# Patient Record
Sex: Female | Born: 1960
Health system: Southern US, Community
[De-identification: ages and names within clinical notes are randomized; demographics above are authoritative.]

## PROBLEM LIST (undated history)

## (undated) DIAGNOSIS — M1990 Unspecified osteoarthritis, unspecified site: Secondary | ICD-10-CM

## (undated) DIAGNOSIS — E785 Hyperlipidemia, unspecified: Secondary | ICD-10-CM

## (undated) DIAGNOSIS — F1920 Other psychoactive substance dependence, uncomplicated: Secondary | ICD-10-CM

## (undated) DIAGNOSIS — F1410 Cocaine abuse, uncomplicated: Secondary | ICD-10-CM

## (undated) DIAGNOSIS — Z5900 Homelessness unspecified: Secondary | ICD-10-CM

## (undated) DIAGNOSIS — F329 Major depressive disorder, single episode, unspecified: Secondary | ICD-10-CM

## (undated) DIAGNOSIS — K219 Gastro-esophageal reflux disease without esophagitis: Secondary | ICD-10-CM

## (undated) DIAGNOSIS — E1322 Other specified diabetes mellitus with diabetic chronic kidney disease: Secondary | ICD-10-CM

## (undated) DIAGNOSIS — J45909 Unspecified asthma, uncomplicated: Secondary | ICD-10-CM

## (undated) DIAGNOSIS — F1210 Cannabis abuse, uncomplicated: Secondary | ICD-10-CM

## (undated) DIAGNOSIS — C801 Malignant (primary) neoplasm, unspecified: Secondary | ICD-10-CM

## (undated) DIAGNOSIS — Q2112 Patent foramen ovale: Secondary | ICD-10-CM

## (undated) DIAGNOSIS — F32A Depression, unspecified: Secondary | ICD-10-CM

## (undated) DIAGNOSIS — T7840XA Allergy, unspecified, initial encounter: Secondary | ICD-10-CM

## (undated) DIAGNOSIS — I10 Essential (primary) hypertension: Secondary | ICD-10-CM

## (undated) DIAGNOSIS — J449 Chronic obstructive pulmonary disease, unspecified: Secondary | ICD-10-CM

## (undated) DIAGNOSIS — Q211 Atrial septal defect: Secondary | ICD-10-CM

## (undated) DIAGNOSIS — Z860101 Personal history of adenomatous and serrated colon polyps: Secondary | ICD-10-CM

## (undated) DIAGNOSIS — J302 Other seasonal allergic rhinitis: Secondary | ICD-10-CM

## (undated) DIAGNOSIS — F419 Anxiety disorder, unspecified: Secondary | ICD-10-CM

## (undated) DIAGNOSIS — F1010 Alcohol abuse, uncomplicated: Secondary | ICD-10-CM

## (undated) DIAGNOSIS — N183 Chronic kidney disease, stage 3 (moderate): Secondary | ICD-10-CM

## (undated) DIAGNOSIS — R011 Cardiac murmur, unspecified: Secondary | ICD-10-CM

## (undated) HISTORY — DX: Major depressive disorder, single episode, unspecified: F32.9

## (undated) HISTORY — DX: Patent foramen ovale: Q21.12

## (undated) HISTORY — DX: Chronic kidney disease, stage 3 (moderate): N18.3

## (undated) HISTORY — DX: Unspecified osteoarthritis, unspecified site: M19.90

## (undated) HISTORY — DX: Allergy, unspecified, initial encounter: T78.40XA

## (undated) HISTORY — DX: Atrial septal defect: Q21.1

## (undated) HISTORY — DX: Chronic obstructive pulmonary disease, unspecified: J44.9

## (undated) HISTORY — DX: Unspecified asthma, uncomplicated: J45.909

## (undated) HISTORY — DX: Hyperlipidemia, unspecified: E78.5

## (undated) HISTORY — DX: Depression, unspecified: F32.A

## (undated) HISTORY — DX: Anxiety disorder, unspecified: F41.9

## (undated) HISTORY — PX: UPPER GASTROINTESTINAL ENDOSCOPY: SHX188

## (undated) HISTORY — DX: Malignant (primary) neoplasm, unspecified: C80.1

## (undated) HISTORY — DX: Personal history of adenomatous and serrated colon polyps: Z86.0101

## (undated) HISTORY — DX: Gastro-esophageal reflux disease without esophagitis: K21.9

## (undated) HISTORY — DX: Cardiac murmur, unspecified: R01.1

## (undated) HISTORY — DX: Other specified diabetes mellitus with diabetic chronic kidney disease: E13.22

---

## 2000-12-25 ENCOUNTER — Emergency Department (HOSPITAL_COMMUNITY): Admission: EM | Admit: 2000-12-25 | Discharge: 2000-12-25 | Payer: Self-pay | Admitting: Emergency Medicine

## 2000-12-25 ENCOUNTER — Encounter: Payer: Self-pay | Admitting: Emergency Medicine

## 2000-12-27 ENCOUNTER — Emergency Department (HOSPITAL_COMMUNITY): Admission: EM | Admit: 2000-12-27 | Discharge: 2000-12-27 | Payer: Self-pay | Admitting: *Deleted

## 2003-11-18 ENCOUNTER — Emergency Department (HOSPITAL_COMMUNITY): Admission: EM | Admit: 2003-11-18 | Discharge: 2003-11-18 | Payer: Self-pay | Admitting: Emergency Medicine

## 2008-01-25 ENCOUNTER — Emergency Department (HOSPITAL_COMMUNITY): Admission: EM | Admit: 2008-01-25 | Discharge: 2008-01-25 | Payer: Self-pay | Admitting: Emergency Medicine

## 2008-09-25 ENCOUNTER — Emergency Department (HOSPITAL_COMMUNITY): Admission: EM | Admit: 2008-09-25 | Discharge: 2008-09-26 | Payer: Self-pay | Admitting: Emergency Medicine

## 2008-10-15 ENCOUNTER — Emergency Department (HOSPITAL_COMMUNITY): Admission: EM | Admit: 2008-10-15 | Discharge: 2008-10-15 | Payer: Self-pay | Admitting: Emergency Medicine

## 2008-11-04 ENCOUNTER — Emergency Department (HOSPITAL_COMMUNITY): Admission: EM | Admit: 2008-11-04 | Discharge: 2008-11-04 | Payer: Self-pay | Admitting: Family Medicine

## 2008-11-09 ENCOUNTER — Emergency Department (HOSPITAL_COMMUNITY): Admission: EM | Admit: 2008-11-09 | Discharge: 2008-11-09 | Payer: Self-pay | Admitting: Family Medicine

## 2008-12-09 ENCOUNTER — Emergency Department (HOSPITAL_COMMUNITY): Admission: EM | Admit: 2008-12-09 | Discharge: 2008-12-09 | Payer: Self-pay | Admitting: Family Medicine

## 2008-12-19 ENCOUNTER — Encounter (HOSPITAL_BASED_OUTPATIENT_CLINIC_OR_DEPARTMENT_OTHER): Admission: RE | Admit: 2008-12-19 | Discharge: 2009-03-19 | Payer: Self-pay | Admitting: General Surgery

## 2008-12-22 ENCOUNTER — Ambulatory Visit: Payer: Self-pay | Admitting: Surgery

## 2008-12-22 ENCOUNTER — Ambulatory Visit: Admission: RE | Admit: 2008-12-22 | Discharge: 2008-12-22 | Payer: Self-pay | Admitting: General Surgery

## 2008-12-22 ENCOUNTER — Encounter (HOSPITAL_BASED_OUTPATIENT_CLINIC_OR_DEPARTMENT_OTHER): Payer: Self-pay | Admitting: General Surgery

## 2009-03-18 HISTORY — PX: FRACTURE SURGERY: SHX138

## 2009-05-05 ENCOUNTER — Emergency Department (HOSPITAL_COMMUNITY): Admission: EM | Admit: 2009-05-05 | Discharge: 2009-05-06 | Payer: Self-pay | Admitting: Emergency Medicine

## 2010-01-27 ENCOUNTER — Emergency Department (HOSPITAL_COMMUNITY): Admission: EM | Admit: 2010-01-27 | Discharge: 2010-01-28 | Payer: Self-pay | Admitting: Emergency Medicine

## 2010-03-02 ENCOUNTER — Emergency Department (HOSPITAL_COMMUNITY)
Admission: EM | Admit: 2010-03-02 | Discharge: 2010-03-03 | Payer: Self-pay | Source: Home / Self Care | Admitting: Emergency Medicine

## 2010-05-28 LAB — CBC
HCT: 40.3 % (ref 36.0–46.0)
Platelets: 310 10*3/uL (ref 150–400)
RBC: 4.36 MIL/uL (ref 3.87–5.11)
RDW: 16.9 % — ABNORMAL HIGH (ref 11.5–15.5)
WBC: 7.4 10*3/uL (ref 4.0–10.5)

## 2010-05-28 LAB — COMPREHENSIVE METABOLIC PANEL
ALT: 35 U/L (ref 0–35)
Albumin: 3.9 g/dL (ref 3.5–5.2)
Alkaline Phosphatase: 136 U/L — ABNORMAL HIGH (ref 39–117)
Chloride: 103 mEq/L (ref 96–112)
Potassium: 4.7 mEq/L (ref 3.5–5.1)
Sodium: 138 mEq/L (ref 135–145)
Total Protein: 8.1 g/dL (ref 6.0–8.3)

## 2010-05-28 LAB — DIFFERENTIAL
Basophils Relative: 1 % (ref 0–1)
Eosinophils Absolute: 0.2 10*3/uL (ref 0.0–0.7)
Eosinophils Relative: 3 % (ref 0–5)
Monocytes Absolute: 0.6 10*3/uL (ref 0.1–1.0)
Monocytes Relative: 8 % (ref 3–12)

## 2010-05-28 LAB — RAPID URINE DRUG SCREEN, HOSP PERFORMED: Tetrahydrocannabinol: NOT DETECTED

## 2010-05-28 LAB — URINALYSIS, ROUTINE W REFLEX MICROSCOPIC
Hgb urine dipstick: NEGATIVE
Nitrite: NEGATIVE
Specific Gravity, Urine: 1.008 (ref 1.005–1.030)
Urobilinogen, UA: 0.2 mg/dL (ref 0.0–1.0)
pH: 5 (ref 5.0–8.0)

## 2010-05-28 LAB — ETHANOL: Alcohol, Ethyl (B): 340 mg/dL — ABNORMAL HIGH (ref 0–10)

## 2010-05-28 LAB — POCT PREGNANCY, URINE: Preg Test, Ur: NEGATIVE

## 2010-12-18 LAB — CBC
HCT: 40.2
MCV: 100.5 — ABNORMAL HIGH
Platelets: 104 — ABNORMAL LOW
WBC: 8.7

## 2010-12-18 LAB — URINALYSIS, ROUTINE W REFLEX MICROSCOPIC
Hgb urine dipstick: NEGATIVE
Protein, ur: NEGATIVE
Urobilinogen, UA: 0.2

## 2010-12-18 LAB — POCT PREGNANCY, URINE: Preg Test, Ur: NEGATIVE

## 2010-12-18 LAB — POCT I-STAT, CHEM 8
Creatinine, Ser: 1.2
Glucose, Bld: 108 — ABNORMAL HIGH
Hemoglobin: 14.3
Sodium: 132 — ABNORMAL LOW
TCO2: 21

## 2010-12-18 LAB — DIFFERENTIAL
Eosinophils Absolute: 0
Eosinophils Relative: 0
Lymphs Abs: 0.4 — ABNORMAL LOW
Monocytes Relative: 8

## 2010-12-18 LAB — URINE MICROSCOPIC-ADD ON

## 2011-06-05 ENCOUNTER — Encounter (HOSPITAL_COMMUNITY): Payer: Self-pay | Admitting: *Deleted

## 2011-06-05 ENCOUNTER — Emergency Department (HOSPITAL_COMMUNITY)
Admission: EM | Admit: 2011-06-05 | Discharge: 2011-06-05 | Disposition: A | Discharge status: Home / Self Care | Payer: No Typology Code available for payment source | Attending: Emergency Medicine | Admitting: Emergency Medicine

## 2011-06-05 DIAGNOSIS — IMO0002 Reserved for concepts with insufficient information to code with codable children: Secondary | ICD-10-CM | POA: Insufficient documentation

## 2011-06-05 DIAGNOSIS — S39012A Strain of muscle, fascia and tendon of lower back, initial encounter: Secondary | ICD-10-CM

## 2011-06-05 DIAGNOSIS — S8392XA Sprain of unspecified site of left knee, initial encounter: Secondary | ICD-10-CM

## 2011-06-05 DIAGNOSIS — S335XXA Sprain of ligaments of lumbar spine, initial encounter: Secondary | ICD-10-CM | POA: Insufficient documentation

## 2011-06-05 MED ORDER — IBUPROFEN 800 MG PO TABS: 400.0000 mg | ORAL_TABLET | Freq: Three times a day (TID) | ORAL | Status: AC

## 2011-06-05 MED ORDER — HYDROCODONE-ACETAMINOPHEN 5-325 MG PO TABS: 1.0000 | ORAL_TABLET | ORAL | Status: AC | PRN
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
## 2011-06-05 NOTE — Discharge Instructions (Signed)
Take vicodin as prescribed for severe pain.   Do not drive within four hours of taking this medication (may cause drowsiness or confusion).  Take ibuprofen w/ food twice a day as well.  Apply ice to painful areas 2-3 times a day for 15-20 minutes.  Avoid activities that aggravate pain.  Follow up with your doctor for blood pressure recheck.  Uncontrolled high blood pressure can lead to heart attack, stroke, blindness and kidney failure.  Call [facility] [phone] if you do not have a primary care doctor and would like assistance with finding one.   You may return to the ER if symptoms worsen or you have any other concerns.
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
## 2011-06-05 NOTE — ED Provider Notes (Signed)
Medical screening examination/treatment/procedure(s) were performed by non-physician practitioner and as supervising physician I was immediately available for consultation/collaboration.   Julianne Rice, MD 06/05/11 1425

## 2012-05-02 ENCOUNTER — Emergency Department (HOSPITAL_COMMUNITY)
Admission: EM | Admit: 2012-05-02 | Discharge: 2012-05-02 | Disposition: A | Discharge status: Home / Self Care | Payer: Self-pay | Attending: Emergency Medicine | Admitting: Emergency Medicine

## 2012-05-02 ENCOUNTER — Encounter (HOSPITAL_COMMUNITY): Payer: Self-pay | Admitting: *Deleted

## 2012-05-02 DIAGNOSIS — X789XXA Intentional self-harm by unspecified sharp object, initial encounter: Secondary | ICD-10-CM | POA: Insufficient documentation

## 2012-05-02 DIAGNOSIS — IMO0002 Reserved for concepts with insufficient information to code with codable children: Secondary | ICD-10-CM | POA: Insufficient documentation

## 2012-05-02 DIAGNOSIS — S61509A Unspecified open wound of unspecified wrist, initial encounter: Secondary | ICD-10-CM | POA: Insufficient documentation

## 2012-05-02 DIAGNOSIS — F1910 Other psychoactive substance abuse, uncomplicated: Secondary | ICD-10-CM | POA: Insufficient documentation

## 2012-05-02 DIAGNOSIS — T7491XA Unspecified adult maltreatment, confirmed, initial encounter: Secondary | ICD-10-CM | POA: Insufficient documentation

## 2012-05-02 DIAGNOSIS — F1010 Alcohol abuse, uncomplicated: Secondary | ICD-10-CM | POA: Insufficient documentation

## 2012-05-02 DIAGNOSIS — F10929 Alcohol use, unspecified with intoxication, unspecified: Secondary | ICD-10-CM

## 2012-05-02 DIAGNOSIS — Z7289 Other problems related to lifestyle: Secondary | ICD-10-CM

## 2012-05-02 DIAGNOSIS — T7411XA Adult physical abuse, confirmed, initial encounter: Secondary | ICD-10-CM | POA: Insufficient documentation

## 2012-05-02 DIAGNOSIS — T7492XA Unspecified child maltreatment, confirmed, initial encounter: Secondary | ICD-10-CM | POA: Insufficient documentation

## 2012-05-02 DIAGNOSIS — F17200 Nicotine dependence, unspecified, uncomplicated: Secondary | ICD-10-CM | POA: Insufficient documentation

## 2012-05-02 LAB — COMPREHENSIVE METABOLIC PANEL
ALT: 69 U/L — ABNORMAL HIGH (ref 0–35)
Albumin: 3.9 g/dL (ref 3.5–5.2)
Alkaline Phosphatase: 99 U/L (ref 39–117)
Potassium: 4.8 mEq/L (ref 3.5–5.1)
Sodium: 138 mEq/L (ref 135–145)
Total Protein: 8.1 g/dL (ref 6.0–8.3)

## 2012-05-02 LAB — CBC WITH DIFFERENTIAL/PLATELET
Basophils Relative: 1 % (ref 0–1)
Eosinophils Absolute: 0.2 10*3/uL (ref 0.0–0.7)
MCH: 35.2 pg — ABNORMAL HIGH (ref 26.0–34.0)
MCHC: 35.7 g/dL (ref 30.0–36.0)
Neutrophils Relative %: 57 % (ref 43–77)
Platelets: 268 10*3/uL (ref 150–400)
RDW: 12.4 % (ref 11.5–15.5)

## 2012-05-02 LAB — ACETAMINOPHEN LEVEL: Acetaminophen (Tylenol), Serum: <15 ug/mL (ref 10–30)

## 2012-05-02 LAB — ETHANOL: Alcohol, Ethyl (B): 266 mg/dL — ABNORMAL HIGH (ref 0–11)

## 2012-05-02 NOTE — ED Notes (Signed)
Pt placed in paper scrubs, wanded by security. 1 bag of pt belongings behind nurse's desk

## 2012-05-02 NOTE — ED Notes (Signed)
Pt took off dressing covering wrist lacerations, threw it on floor. Stated she did not want to wear it. Bleeding controlled. No sitters available at this time

## 2012-05-02 NOTE — ED Notes (Signed)
MD at bedside. 

## 2012-05-02 NOTE — ED Notes (Signed)
Pt tearful - reports taking "his" vicodin, and other recreational drugs/alcohol. reports last drink 2 hours ago. Out of prison 1 year ago, has been struggling with addiction since. Pt states she does not want or need detox. Reports cutting wrist to "releive pain"
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
## 2012-05-02 NOTE — ED Notes (Signed)
[id number] Expected date:05/02/12<BR> Expected time:12:32 PM<BR> Means of arrival:Ambulance<BR> Comments:<BR> ETOH, S/a cut wrist
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
## 2012-05-02 NOTE — ED Notes (Signed)
Pt got bag of belongings from behind nurse's desk, changed into clothing. Found pt in room getting dressed, stating daughter is going to pick her up. Security and Agricultural consultant notified

## 2012-08-19 ENCOUNTER — Encounter (HOSPITAL_COMMUNITY): Payer: Self-pay | Admitting: Emergency Medicine

## 2012-08-19 ENCOUNTER — Emergency Department (INDEPENDENT_AMBULATORY_CARE_PROVIDER_SITE_OTHER)
Admission: EM | Admit: 2012-08-19 | Discharge: 2012-08-19 | Disposition: A | Discharge status: Home / Self Care | Payer: Self-pay | Source: Home / Self Care | Attending: Emergency Medicine | Admitting: Emergency Medicine

## 2012-08-19 DIAGNOSIS — R490 Dysphonia: Secondary | ICD-10-CM

## 2012-08-19 DIAGNOSIS — I83813 Varicose veins of bilateral lower extremities with pain: Secondary | ICD-10-CM

## 2012-08-19 DIAGNOSIS — J029 Acute pharyngitis, unspecified: Secondary | ICD-10-CM

## 2012-08-19 DIAGNOSIS — I83893 Varicose veins of bilateral lower extremities with other complications: Secondary | ICD-10-CM

## 2012-08-19 LAB — POCT RAPID STREP A: Streptococcus, Group A Screen (Direct): NEGATIVE

## 2012-08-19 MED ORDER — PREDNISONE 20 MG PO TABS: 40.0000 mg | ORAL_TABLET | Freq: Every day | ORAL | Status: AC

## 2012-08-19 MED ORDER — ACETAMINOPHEN 500 MG PO TABS: 500.0000 mg | ORAL_TABLET | Freq: Four times a day (QID) | ORAL | Status: DC | PRN

## 2012-08-19 MED ORDER — CETIRIZINE HCL 10 MG PO CAPS: 1.0000 | ORAL_CAPSULE | Freq: Every day | ORAL | Status: DC

## 2012-08-19 NOTE — ED Provider Notes (Signed)
History     CSN: [id number]  Arrival date & time 08/19/12  1014   First MD Initiated Contact with Patient 08/19/12 1031      Chief Complaint  Patient presents with  . Sore Throat  . Leg Pain    (Consider location/radiation/quality/duration/timing/severity/associated sxs/prior treatment) HPI Comments: Patient presents urgent care is morning describing for about 2 months ago she's been having discomfort when she swallows and her voice has changed for almost 2 months as she is very hoarse. She's also describing cough and upper respiratory congestion, as if she is having allergies. Denies any fevers, and occasionally feels short of breath with wheezing. Patient denies any fevers or any shortness of breath at rest denies any chest pains. She does admit that she is a chronic smoker. Patient denies any constitutional symptoms such as fevers, bodyaches such as a progress, myalgias or unintentional weight loss.   Pace it also once to report that for several months at the end of the day her legs get swollen and she feels soreness most specifically in the anterior aspect of both Tibial regions. She has had varicose veins for a [place] time.  Patient is a 52 y.o. female presenting with pharyngitis.  Sore Throat This is a recurrent problem. The problem occurs constantly. The problem has not changed since onset.Pertinent negatives include no chest pain, no headaches and no shortness of breath. The symptoms are aggravated by swallowing. Nothing relieves the symptoms. The treatment provided no relief.    History reviewed. No pertinent past medical history.  Past Surgical History  Procedure Laterality Date  . Fracture surgery      L arm    No family history on file.  History  Substance Use Topics  . Smoking status: Current Every Day Smoker -- 1.00 packs/day    Types: Cigarettes  . Smokeless tobacco: Not on file  . Alcohol Use: Yes    OB History   Grav Para Term Preterm Abortions TAB SAB Ect  Mult Living                  Review of Systems  Constitutional: Negative for fever, chills and fatigue.  HENT: Positive for ear pain, congestion, sore throat, rhinorrhea, voice change and postnasal drip. Negative for hearing loss and sinus pressure.   Eyes: Negative for pain.  Respiratory: Negative for shortness of breath.   Cardiovascular: Negative for chest pain.  Musculoskeletal: Negative for joint swelling and arthralgias.  Skin: Negative for color change, rash and wound.  Neurological: Negative for weakness and headaches.    Allergies  Review of patient's allergies indicates no known allergies.  Home Medications   Current Outpatient Rx  Name  Route  Sig  Dispense  Refill  . acetaminophen (TYLENOL) 500 MG tablet   Oral   Take 1 tablet (500 mg total) by mouth every 6 (six) hours as needed for pain.   30 tablet   0   . Cetirizine HCl (ZYRTEC ALLERGY) 10 MG CAPS   Oral   Take 1 capsule (10 mg total) by mouth daily. X 2 weeks   30 capsule   1   . predniSONE (DELTASONE) 20 MG tablet   Oral   Take 2 tablets (40 mg total) by mouth daily.   15 tablet   0     BP 146/83  Pulse 86  Temp(Src) 97.8 F (36.6 C) (Oral)  Resp 16  SpO2 100%  Physical Exam  Nursing note and vitals reviewed. Constitutional: No distress.  HENT:  Head: Normocephalic.  Mouth/Throat: Uvula is midline and mucous membranes are normal. Posterior oropharyngeal erythema present. No oropharyngeal exudate, posterior oropharyngeal edema or tonsillar abscesses.  Eyes: Conjunctivae are normal. Right eye exhibits no discharge. Left eye exhibits no discharge. No scleral icterus.  Neck: Neck supple. No JVD present. No tracheal deviation present. No thyromegaly present.  Pulmonary/Chest: No respiratory distress. She has no wheezes. She has no rales. She exhibits no tenderness.  Lymphadenopathy:    She has no cervical adenopathy.  Neurological: She is alert.  Skin: No rash noted. No erythema. No pallor.     ED Course  Procedures (including critical care time)  Labs Reviewed  POCT RAPID STREP A ([place])   No results found.   1. Hoarseness of voice   2. Pharyngitis   3. 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 to have a direct laryngoscopy. Have explained to her that smokers have a higher incidence of laryngeal and pharyngeal carcinoma. Patient has been started on a structure regimen of Zyrtec along with a course of prednisone  Problem #2 lower extremity pains. Today's exam was not revealing of a localize lower extremity infection 2 soft tissue such as thrombophlebitis, lymphangitis or cellulitis. Patient has extensive varicose veins in telangiectasias we discussed strategies to minimize swelling and pain  Problem #3 chronic smoker and no continuity of care patient has been provided today with, information to establish with the newly opened [facility]. Have explained to patient that if cough persisted beyond 2 weeks to bring this to the attention of her new doctor at a clinic.       Rosana Hoes, MD 08/19/12 [phone]

## 2012-08-19 NOTE — ED Notes (Signed)
Pt c/o sore throat onset 2 months Sx include: loss of voice, bilateral ear pain, HA, odynophagia, dyspnea Hx of allergies... Sx get worse at night... Trying OTC allergy meds w/no relief  Also c/o bilateral leg pain due to varicose veins  She is alert and oriented w/no signs of acute distress.

## 2012-09-15 ENCOUNTER — Emergency Department (HOSPITAL_COMMUNITY)
Admission: EM | Admit: 2012-09-15 | Discharge: 2012-09-16 | Disposition: A | Discharge status: Home / Self Care | Payer: No Typology Code available for payment source | Attending: Emergency Medicine | Admitting: Emergency Medicine

## 2012-09-15 ENCOUNTER — Emergency Department (HOSPITAL_COMMUNITY): Payer: Self-pay

## 2012-09-15 DIAGNOSIS — S0990XA Unspecified injury of head, initial encounter: Secondary | ICD-10-CM | POA: Insufficient documentation

## 2012-09-15 DIAGNOSIS — S46909A Unspecified injury of unspecified muscle, fascia and tendon at shoulder and upper arm level, unspecified arm, initial encounter: Secondary | ICD-10-CM | POA: Insufficient documentation

## 2012-09-15 DIAGNOSIS — IMO0002 Reserved for concepts with insufficient information to code with codable children: Secondary | ICD-10-CM | POA: Insufficient documentation

## 2012-09-15 DIAGNOSIS — Z23 Encounter for immunization: Secondary | ICD-10-CM | POA: Insufficient documentation

## 2012-09-15 DIAGNOSIS — S81809A Unspecified open wound, unspecified lower leg, initial encounter: Secondary | ICD-10-CM | POA: Insufficient documentation

## 2012-09-15 DIAGNOSIS — F17200 Nicotine dependence, unspecified, uncomplicated: Secondary | ICD-10-CM | POA: Insufficient documentation

## 2012-09-15 DIAGNOSIS — S81009A Unspecified open wound, unspecified knee, initial encounter: Secondary | ICD-10-CM | POA: Insufficient documentation

## 2012-09-15 DIAGNOSIS — S4980XA Other specified injuries of shoulder and upper arm, unspecified arm, initial encounter: Secondary | ICD-10-CM | POA: Insufficient documentation

## 2012-09-15 DIAGNOSIS — Y9301 Activity, walking, marching and hiking: Secondary | ICD-10-CM | POA: Insufficient documentation

## 2012-09-15 DIAGNOSIS — Y92410 Unspecified street and highway as the place of occurrence of the external cause: Secondary | ICD-10-CM | POA: Insufficient documentation

## 2012-09-15 MED ORDER — FENTANYL CITRATE 0.05 MG/ML IJ SOLN
INTRAMUSCULAR | Status: AC
  Administered 2012-09-16: 50 ug via INTRAVENOUS
  Filled 2012-09-15: qty 2

## 2012-09-15 MED ORDER — TETANUS-DIPHTH-ACELL PERTUSSIS 5-2.5-18.5 LF-MCG/0.5 IM SUSP
0.5000 mL | Freq: Once | INTRAMUSCULAR | Status: AC
  Administered 2012-09-16: 0.5 mL via INTRAMUSCULAR

## 2012-09-15 MED ORDER — FENTANYL CITRATE 0.05 MG/ML IJ SOLN
50.0000 ug | INTRAMUSCULAR | Status: DC | PRN
  Administered 2012-09-15 – 2012-09-16 (×4): 50 ug via INTRAVENOUS
  Filled 2012-09-15: qty 2

## 2012-09-15 MED ORDER — ONDANSETRON HCL 4 MG/2ML IJ SOLN
4.0000 mg | Freq: Once | INTRAMUSCULAR | Status: AC
  Administered 2012-09-16: 4 mg via INTRAVENOUS
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
## 2012-09-16 ENCOUNTER — Encounter (HOSPITAL_COMMUNITY): Payer: Self-pay | Admitting: Radiology

## 2012-09-16 ENCOUNTER — Emergency Department (HOSPITAL_COMMUNITY): Payer: No Typology Code available for payment source

## 2012-09-16 ENCOUNTER — Emergency Department (HOSPITAL_COMMUNITY): Payer: Self-pay

## 2012-09-16 LAB — BASIC METABOLIC PANEL
Calcium: 9 mg/dL (ref 8.4–10.5)
GFR calc non Af Amer: >90 mL/min (ref >90)
Potassium: 3.5 mEq/L (ref 3.5–5.1)
Sodium: 139 mEq/L (ref 135–145)

## 2012-09-16 LAB — CBC
Hemoglobin: 13.8 g/dL (ref 12.0–15.0)
MCH: 36.4 pg — ABNORMAL HIGH (ref 26.0–34.0)
MCHC: 36.3 g/dL — ABNORMAL HIGH (ref 30.0–36.0)
Platelets: 161 10*3/uL (ref 150–400)
RBC: 3.79 MIL/uL — ABNORMAL LOW (ref 3.87–5.11)

## 2012-09-16 MED ORDER — CEFAZOLIN SODIUM 1-5 GM-% IV SOLN
1.0000 g | Freq: Once | INTRAVENOUS | Status: AC
  Administered 2012-09-16: 1 g via INTRAVENOUS
  Filled 2012-09-16: qty 50

## 2012-09-16 MED ORDER — HYDROCODONE-ACETAMINOPHEN 5-325 MG PO TABS: 2.0000 | ORAL_TABLET | ORAL | Status: DC | PRN

## 2012-09-16 MED ORDER — IOHEXOL 300 MG/ML  SOLN
100.0000 mL | Freq: Once | INTRAMUSCULAR | Status: AC | PRN
  Administered 2012-09-16: 100 mL via INTRAVENOUS

## 2012-09-16 MED ORDER — ONDANSETRON HCL 4 MG/2ML IJ SOLN
INTRAMUSCULAR | Status: AC
  Filled 2012-09-16: qty 2

## 2012-09-16 MED ORDER — HYDROMORPHONE HCL PF 1 MG/ML IJ SOLN
1.0000 mg | Freq: Once | INTRAMUSCULAR | Status: AC
  Administered 2012-09-16: 1 mg via INTRAVENOUS
  Filled 2012-09-16: qty 1

## 2012-09-16 MED ORDER — IBUPROFEN 600 MG PO TABS: 600.0000 mg | ORAL_TABLET | Freq: Four times a day (QID) | ORAL | Status: DC | PRN

## 2012-09-16 MED ORDER — CEPHALEXIN 500 MG PO CAPS: 500.0000 mg | ORAL_CAPSULE | Freq: Four times a day (QID) | ORAL | Status: DC

## 2012-09-16 MED ORDER — TETANUS-DIPHTH-ACELL PERTUSSIS 5-2.5-18.5 LF-MCG/0.5 IM SUSP
INTRAMUSCULAR | Status: AC
  Filled 2012-09-16: qty 0.5
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
## 2012-09-26 ENCOUNTER — Emergency Department (HOSPITAL_COMMUNITY)
Admission: EM | Admit: 2012-09-26 | Discharge: 2012-09-26 | Disposition: A | Discharge status: Home / Self Care | Payer: No Typology Code available for payment source | Attending: Emergency Medicine | Admitting: Emergency Medicine

## 2012-09-26 ENCOUNTER — Encounter (HOSPITAL_COMMUNITY): Payer: Self-pay | Admitting: Emergency Medicine

## 2012-09-26 DIAGNOSIS — J309 Allergic rhinitis, unspecified: Secondary | ICD-10-CM | POA: Insufficient documentation

## 2012-09-26 DIAGNOSIS — Z79899 Other long term (current) drug therapy: Secondary | ICD-10-CM | POA: Insufficient documentation

## 2012-09-26 DIAGNOSIS — I10 Essential (primary) hypertension: Secondary | ICD-10-CM | POA: Insufficient documentation

## 2012-09-26 DIAGNOSIS — IMO0001 Reserved for inherently not codable concepts without codable children: Secondary | ICD-10-CM | POA: Insufficient documentation

## 2012-09-26 DIAGNOSIS — F17200 Nicotine dependence, unspecified, uncomplicated: Secondary | ICD-10-CM | POA: Insufficient documentation

## 2012-09-26 DIAGNOSIS — Z4802 Encounter for removal of sutures: Secondary | ICD-10-CM

## 2012-09-26 HISTORY — DX: Essential (primary) hypertension: I10

## 2012-09-26 HISTORY — DX: Other seasonal allergic rhinitis: J30.2

## 2012-09-26 MED ORDER — TRAMADOL HCL 50 MG PO TABS
50.0000 mg | ORAL_TABLET | Freq: Once | ORAL | Status: AC
  Administered 2012-09-26: 50 mg via ORAL
  Filled 2012-09-26: qty 1

## 2012-09-26 MED ORDER — IBUPROFEN 600 MG PO TABS: 600.0000 mg | ORAL_TABLET | Freq: Three times a day (TID) | ORAL | Status: DC | PRN

## 2012-09-26 NOTE — ED Provider Notes (Signed)
History  This chart was scribed for non-physician practitioner Baron Sane, PA-C, working with Wandra Arthurs, MD, by Neta Ehlers, ED Scribe. This patient was seen in room TR10C/TR10C and the patient's care was started at 5:16 PM.   CSN: [id number] Arrival date & time 09/26/12  1640  First MD Initiated Contact with Patient 09/26/12 1658     Chief Complaint  Patient presents with  . Suture / Staple Removal    The history is provided by the patient. No language interpreter was used.   HPI Comments: [name] is a 52 y.o. female PMHx significant for HTN who presents to the Emergency Department for the purpose of staple removal from a wound to the shin of her left leg which occurred eleven days ago. The pt states that the wound has been healing well, but she reports that the wound has been draining a clear yellowish fluid, but denies any purulent drainage. She denies any increased redness to the wound or surrounding skin. She denies any fever. She reports that the wound is still painful, and she describes the pain as an 8/10.  Patient states she has been taking all medications as prescribed to her, including the Keflex, at initial visit. She denies any numbness or tingling in the extremity.   Past Medical History  Diagnosis Date  . Hypertension     pt stated "every once in a while BP will be high but has not been prescribed medication for HTN.   . Seasonal allergies    Past Surgical History  Procedure Laterality Date  . Fracture surgery      L arm  . Cesarean section     No family history on file. History  Substance Use Topics  . Smoking status: Current Every Day Smoker -- 1.00 packs/day    Types: Cigarettes  . Smokeless tobacco: Not on file  . Alcohol Use: Yes   No OB history provided.  Review of Systems  Constitutional: Negative for fever.  Musculoskeletal: Positive for myalgias.  Skin: Positive for wound. Negative for color change and pallor.  Neurological:  Negative for weakness and numbness.    Allergies  Review of patient's allergies indicates no known allergies.  Home Medications   Current Outpatient Rx  Name  Route  Sig  Dispense  Refill  . albuterol (PROVENTIL HFA;VENTOLIN HFA) 108 (90 BASE) MCG/ACT inhaler   Inhalation   Inhale 2 puffs into the lungs every 6 (six) hours as needed for wheezing.         . cephALEXin (KEFLEX) 500 MG capsule   Oral   Take 500 mg by mouth 4 (four) times daily.         Marland Kitchen HYDROcodone-acetaminophen (NORCO/VICODIN) 5-325 MG per tablet   Oral   Take 1 tablet by mouth every 6 (six) hours as needed for pain.         Marland Kitchen ibuprofen (ADVIL,MOTRIN) 200 MG tablet   Oral   Take 400 mg by mouth daily as needed for pain.         Marland Kitchen ibuprofen (ADVIL,MOTRIN) 600 MG tablet   Oral   Take 600 mg by mouth every 6 (six) hours as needed for pain.          Triage Vitals: BP 149/96  Pulse 102  Temp(Src) 98.1 F (36.7 C) (Oral)  Resp 18  SpO2 100%  Physical Exam  Nursing note and vitals reviewed. Constitutional: She is oriented to person, place, and time. She appears well-developed and  well-nourished. No distress.  HENT:  Head: Normocephalic and atraumatic.  Eyes: Conjunctivae are normal.  Neck: Neck supple.  Cardiovascular: Intact distal pulses.   Musculoskeletal: She exhibits no edema.  Neurological: She is alert and oriented to person, place, and time.  Skin: Skin is warm and dry. She is not diaphoretic. No pallor.  Appropriately healing wound on left lower extremity. No edema, warmth, purulent drainage at site.   Psychiatric: She has a normal mood and affect.    ED Course  Procedures (including critical care time)  DIAGNOSTIC STUDIES: Oxygen Saturation is 100% on room air, normal  by my interpretation.    COORDINATION OF CARE:  5:19 PM- Discussed the treatment plan with the pt which includes the removal of the staples and wound care, and the pt agreed.   5:26 PM SUTURE  REMOVAL Performed by: Baron Sane, PA-C Authorized by: Wandra Arthurs, MD Consent: Verbal consent obtained. Consent given by: patient Required items: required blood products, implants, devices, and special equipment available  Time out: Immediately prior to procedure a "time out" was called to verify the correct patient, procedure, equipment, support staff and site/side marked as required. Location: Medial aspect of shin of left leg.  Wound Appearance: clean  Staples Removed: 14 Post-removal: sterile dressing applied Patient tolerance: Patient tolerated the procedure well with no immediate complications.   Labs Reviewed - No data to display No results found. 1. Encounter for staple removal     MDM  Pt to ER for staple/suture removal and wound check as above. Procedure tolerated well. Vitals normal, no signs of infection. Pt is currently on Keflex as prescribed at initial visit. Scar minimization & return precautions given at dc. Patient is agreeable to plan. Patient is stable at time of discharge    I personally performed the services described in this documentation, which was scribed in my presence. The recorded information has been reviewed and is accurate.     Harlow Mares, PA-C 09/26/12 1919

## 2012-09-26 NOTE — ED Notes (Signed)
Pt walked outside

## 2012-09-26 NOTE — ED Notes (Signed)
Pt here for staple removal from left leg.

## 2012-09-27 NOTE — ED Provider Notes (Signed)
Medical screening examination/treatment/procedure(s) were performed by non-physician practitioner and as supervising physician I was immediately available for consultation/collaboration.   Wandra Arthurs, MD 09/27/12 [phone]

## 2012-10-28 ENCOUNTER — Encounter (HOSPITAL_COMMUNITY): Payer: Self-pay | Admitting: Emergency Medicine

## 2012-10-28 ENCOUNTER — Emergency Department (HOSPITAL_COMMUNITY): Payer: No Typology Code available for payment source

## 2012-10-28 ENCOUNTER — Inpatient Hospital Stay (HOSPITAL_COMMUNITY)
Admission: EM | Admit: 2012-10-28 | Discharge: 2012-10-30 | DRG: 863 | Disposition: A | Discharge status: Home / Self Care | Payer: No Typology Code available for payment source | Attending: Family Medicine | Admitting: Family Medicine

## 2012-10-28 DIAGNOSIS — IMO0002 Reserved for concepts with insufficient information to code with codable children: Secondary | ICD-10-CM

## 2012-10-28 DIAGNOSIS — T798XXA Other early complications of trauma, initial encounter: Secondary | ICD-10-CM

## 2012-10-28 DIAGNOSIS — L03116 Cellulitis of left lower limb: Secondary | ICD-10-CM

## 2012-10-28 DIAGNOSIS — Z5900 Homelessness unspecified: Secondary | ICD-10-CM

## 2012-10-28 DIAGNOSIS — Y92410 Unspecified street and highway as the place of occurrence of the external cause: Secondary | ICD-10-CM

## 2012-10-28 DIAGNOSIS — D7589 Other specified diseases of blood and blood-forming organs: Secondary | ICD-10-CM | POA: Diagnosis present

## 2012-10-28 DIAGNOSIS — F1010 Alcohol abuse, uncomplicated: Secondary | ICD-10-CM | POA: Diagnosis present

## 2012-10-28 DIAGNOSIS — L02419 Cutaneous abscess of limb, unspecified: Secondary | ICD-10-CM

## 2012-10-28 DIAGNOSIS — T8133XS Disruption of traumatic injury wound repair, sequela: Secondary | ICD-10-CM

## 2012-10-28 DIAGNOSIS — F17200 Nicotine dependence, unspecified, uncomplicated: Secondary | ICD-10-CM | POA: Diagnosis present

## 2012-10-28 DIAGNOSIS — K7689 Other specified diseases of liver: Secondary | ICD-10-CM | POA: Diagnosis present

## 2012-10-28 DIAGNOSIS — L089 Local infection of the skin and subcutaneous tissue, unspecified: Principal | ICD-10-CM | POA: Diagnosis present

## 2012-10-28 DIAGNOSIS — I10 Essential (primary) hypertension: Secondary | ICD-10-CM | POA: Diagnosis present

## 2012-10-28 DIAGNOSIS — L03119 Cellulitis of unspecified part of limb: Secondary | ICD-10-CM

## 2012-10-28 DIAGNOSIS — B1920 Unspecified viral hepatitis C without hepatic coma: Secondary | ICD-10-CM | POA: Diagnosis present

## 2012-10-28 DIAGNOSIS — T148XXA Other injury of unspecified body region, initial encounter: Principal | ICD-10-CM

## 2012-10-28 DIAGNOSIS — B182 Chronic viral hepatitis C: Secondary | ICD-10-CM | POA: Diagnosis present

## 2012-10-28 DIAGNOSIS — R7989 Other specified abnormal findings of blood chemistry: Secondary | ICD-10-CM | POA: Diagnosis present

## 2012-10-28 LAB — COMPREHENSIVE METABOLIC PANEL
Alkaline Phosphatase: 181 U/L — ABNORMAL HIGH (ref 39–117)
BUN: 6 mg/dL (ref 6–23)
CO2: 22 mEq/L (ref 19–32)
Chloride: 101 mEq/L (ref 96–112)
Creatinine, Ser: 0.7 mg/dL (ref 0.50–1.10)
GFR calc non Af Amer: >90 mL/min (ref >90)
Total Bilirubin: 0.5 mg/dL (ref 0.3–1.2)

## 2012-10-28 LAB — CBC WITH DIFFERENTIAL/PLATELET
HCT: 39.9 % (ref 36.0–46.0)
Hemoglobin: 14.4 g/dL (ref 12.0–15.0)
Lymphocytes Relative: 16 % (ref 12–46)
Lymphs Abs: 1.1 10*3/uL (ref 0.7–4.0)
Monocytes Absolute: 1 10*3/uL (ref 0.1–1.0)
Monocytes Relative: 14 % — ABNORMAL HIGH (ref 3–12)
Neutro Abs: 4.8 10*3/uL (ref 1.7–7.7)
RBC: 3.91 MIL/uL (ref 3.87–5.11)
WBC: 6.9 10*3/uL (ref 4.0–10.5)

## 2012-10-28 MED ORDER — HYDROMORPHONE HCL PF 1 MG/ML IJ SOLN
1.0000 mg | INTRAMUSCULAR | Status: DC | PRN
  Administered 2012-10-28 – 2012-10-29 (×2): 1 mg via INTRAVENOUS
  Filled 2012-10-28 (×2): qty 1

## 2012-10-28 MED ORDER — ONDANSETRON HCL 4 MG PO TABS
4.0000 mg | ORAL_TABLET | Freq: Four times a day (QID) | ORAL | Status: DC | PRN
  Administered 2012-10-29: 4 mg via ORAL
  Filled 2012-10-28: qty 1

## 2012-10-28 MED ORDER — ALBUTEROL SULFATE (5 MG/ML) 0.5% IN NEBU: 2.5000 mg | INHALATION_SOLUTION | RESPIRATORY_TRACT | Status: DC | PRN

## 2012-10-28 MED ORDER — SODIUM CHLORIDE 0.9 % IJ SOLN: 3.0000 mL | INTRAMUSCULAR | Status: DC | PRN

## 2012-10-28 MED ORDER — HYDROCODONE-ACETAMINOPHEN 5-325 MG PO TABS
1.0000 | ORAL_TABLET | ORAL | Status: DC | PRN
  Administered 2012-10-29 – 2012-10-30 (×7): 2 via ORAL
  Filled 2012-10-28 (×7): qty 2

## 2012-10-28 MED ORDER — ONDANSETRON HCL 4 MG/2ML IJ SOLN
4.0000 mg | Freq: Four times a day (QID) | INTRAMUSCULAR | Status: DC | PRN
  Administered 2012-10-29: 4 mg via INTRAVENOUS
  Filled 2012-10-28: qty 2

## 2012-10-28 MED ORDER — SODIUM CHLORIDE 0.9 % IV BOLUS (SEPSIS)
1000.0000 mL | Freq: Once | INTRAVENOUS | Status: AC
  Administered 2012-10-28: 1000 mL via INTRAVENOUS

## 2012-10-28 MED ORDER — HYDROMORPHONE HCL PF 1 MG/ML IJ SOLN
1.0000 mg | Freq: Once | INTRAMUSCULAR | Status: AC
  Administered 2012-10-28: 1 mg via INTRAVENOUS
  Filled 2012-10-28: qty 1

## 2012-10-28 MED ORDER — ENOXAPARIN SODIUM 40 MG/0.4ML ~~LOC~~ SOLN
40.0000 mg | Freq: Every day | SUBCUTANEOUS | Status: DC
  Administered 2012-10-28 – 2012-10-29 (×2): 40 mg via SUBCUTANEOUS
  Filled 2012-10-28 (×3): qty 0.4

## 2012-10-28 MED ORDER — SODIUM CHLORIDE 0.9 % IV SOLN: 250.0000 mL | INTRAVENOUS | Status: DC | PRN

## 2012-10-28 MED ORDER — CLINDAMYCIN PHOSPHATE 600 MG/50ML IV SOLN
600.0000 mg | Freq: Once | INTRAVENOUS | Status: AC
  Administered 2012-10-28: 600 mg via INTRAVENOUS
  Filled 2012-10-28: qty 50

## 2012-10-28 MED ORDER — SODIUM CHLORIDE 0.9 % IJ SOLN
3.0000 mL | Freq: Two times a day (BID) | INTRAMUSCULAR | Status: DC
  Administered 2012-10-29: 3 mL via INTRAVENOUS

## 2012-10-28 MED ORDER — CLINDAMYCIN PHOSPHATE 600 MG/50ML IV SOLN
600.0000 mg | Freq: Three times a day (TID) | INTRAVENOUS | Status: DC
  Administered 2012-10-29 – 2012-10-30 (×4): 600 mg via INTRAVENOUS
  Filled 2012-10-28 (×6): qty 50

## 2012-10-28 MED ORDER — SODIUM CHLORIDE 0.9 % IV SOLN: INTRAVENOUS | Status: DC

## 2012-10-28 NOTE — ED Notes (Signed)
Wound to left medial shin. Area is warm, erythemous and tender to touch.
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
## 2012-10-28 NOTE — ED Notes (Signed)
Social work at bedside.  
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
## 2012-10-29 ENCOUNTER — Inpatient Hospital Stay (HOSPITAL_COMMUNITY): Payer: MEDICAID

## 2012-10-29 LAB — CBC
HCT: 40 % (ref 36.0–46.0)
MCHC: 36.3 g/dL — ABNORMAL HIGH (ref 30.0–36.0)
MCV: 101.8 fL — ABNORMAL HIGH (ref 78.0–100.0)
Platelets: 121 10*3/uL — ABNORMAL LOW (ref 150–400)
RDW: 13.5 % (ref 11.5–15.5)

## 2012-10-29 LAB — CK: Total CK: 84 U/L (ref 7–177)

## 2012-10-29 LAB — BASIC METABOLIC PANEL
BUN: 5 mg/dL — ABNORMAL LOW (ref 6–23)
Calcium: 8.8 mg/dL (ref 8.4–10.5)
Creatinine, Ser: 0.52 mg/dL (ref 0.50–1.10)
GFR calc Af Amer: >90 mL/min (ref >90)
GFR calc non Af Amer: >90 mL/min (ref >90)
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
## 2012-10-29 NOTE — Progress Notes (Signed)
   CARE MANAGEMENT NOTE 10/29/2012  Patient:  [name], [name]   Account Number:  [id number]  Date Initiated:  10/29/2012  Documentation initiated by:  Olga Coaster  Subjective/Objective Assessment:   ADMITTED WITH WOUND INFECTION     Action/Plan:   PATIENT STAYS WITH BOYFRIEND?; SOC WORKER REFERRAL PLACED - ETOH/ ? HOMELESSNESS   Anticipated DC Date:  11/05/2012   Anticipated DC Plan:  HOME/SELF CARE  In-house referral  Clinical Social Research scientist (physical sciences)      DC Planning Services  CM consult          Status of service:  In process, will continue to follow Medicare Important Message given?  NA - LOS <3 / Initial given by admissions (If response is "NO", the following Medicare IM given date fields will be blank) Per UR Regulation:  Reviewed for med. necessity/level of care/duration of stay  Comments:  10/29/2012- B Kristinia Leavy RN,BSN,MHA
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
## 2012-10-30 MED ORDER — POTASSIUM CHLORIDE CRYS ER 20 MEQ PO TBCR
40.0000 meq | EXTENDED_RELEASE_TABLET | Freq: Once | ORAL | Status: AC
  Administered 2012-10-30: 40 meq via ORAL
  Filled 2012-10-30: qty 2

## 2012-10-30 MED ORDER — CLINDAMYCIN HCL 300 MG PO CAPS: 300.0000 mg | ORAL_CAPSULE | Freq: Four times a day (QID) | ORAL | Status: DC

## 2012-10-30 MED ORDER — HYDROCODONE-ACETAMINOPHEN 5-325 MG PO TABS: 1.0000 | ORAL_TABLET | Freq: Four times a day (QID) | ORAL | Status: DC | PRN

## 2012-10-30 NOTE — Care Management Note (Signed)
    Page 1 of 1   10/30/2012     4:40:07 PM   CARE MANAGEMENT NOTE 10/30/2012  Patient:  [name], [name]   Account Number:  [id number]  Date Initiated:  10/29/2012  Documentation initiated by:  Olga Coaster  Subjective/Objective Assessment:   ADMITTED WITH WOUND INFECTION     Action/Plan:   PATIENT STAYS WITH BOYFRIEND?; SOC WORKER REFERRAL PLACED - ETOH/ ? HOMELESSNESS   Anticipated DC Date:  11/05/2012   Anticipated DC Plan:  HOME/SELF CARE  In-house referral  Clinical Social Worker  Development worker, community      DC Planning Services  CM consult      Choice offered to / List presented to:             Status of service:  In process, will continue to follow Medicare Important Message given?  NA - LOS <3 / Initial given by admissions (If response is "NO", the following Medicare IM given date fields will be blank) Date Medicare IM given:   Date Additional Medicare IM given:    Discharge Disposition:    Per UR Regulation:  Reviewed for med. necessity/level of care/duration of stay  If discussed at [place] of Stay Meetings, dates discussed:    Comments:  10/30/12 [place], MSN, CM-  Spoke with patient on the phone after discharge to provide information for the [facility] to establish PCP.  Address and phone number provided.   10/29/2012- B CHANDLER RN,BSN,MHA

## 2012-10-30 NOTE — Plan of Care (Signed)
D/c instructions given to pt. V/u on how to care for incision to left lower leg. Med script given to pt. V/u. Pt is stable to be d/c. Iv d/c.
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
## 2012-11-04 LAB — CULTURE, BLOOD (ROUTINE X 2): Culture: NO GROWTH

## 2012-11-11 ENCOUNTER — Emergency Department (HOSPITAL_COMMUNITY)
Admission: EM | Admit: 2012-11-11 | Discharge: 2012-11-11 | Disposition: A | Discharge status: Home / Self Care | Payer: MEDICAID | Attending: Emergency Medicine | Admitting: Emergency Medicine

## 2012-11-11 ENCOUNTER — Encounter (HOSPITAL_COMMUNITY): Payer: Self-pay | Admitting: *Deleted

## 2012-11-11 DIAGNOSIS — Z760 Encounter for issue of repeat prescription: Secondary | ICD-10-CM | POA: Insufficient documentation

## 2012-11-11 DIAGNOSIS — Z79899 Other long term (current) drug therapy: Secondary | ICD-10-CM | POA: Insufficient documentation

## 2012-11-11 DIAGNOSIS — F17200 Nicotine dependence, unspecified, uncomplicated: Secondary | ICD-10-CM | POA: Insufficient documentation

## 2012-11-11 DIAGNOSIS — Z5189 Encounter for other specified aftercare: Secondary | ICD-10-CM

## 2012-11-11 DIAGNOSIS — I10 Essential (primary) hypertension: Secondary | ICD-10-CM | POA: Insufficient documentation

## 2012-11-11 DIAGNOSIS — Z4800 Encounter for change or removal of nonsurgical wound dressing: Secondary | ICD-10-CM | POA: Insufficient documentation

## 2012-11-11 MED ORDER — HYDROCODONE-ACETAMINOPHEN 5-325 MG PO TABS: 1.0000 | ORAL_TABLET | Freq: Four times a day (QID) | ORAL | Status: DC | PRN
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
## 2012-11-11 NOTE — ED Provider Notes (Signed)
Medical screening examination/treatment/procedure(s) were conducted as a shared visit with non-physician practitioner(s) and myself.  I personally evaluated the patient during the encounter  I examined and interviewed the pt. Lungs CTAB. Cardiac exam wnl. Abdomen soft. LLE wound appears to be healing well. Still surrounded by mild erythema, but pt notes it is much improved. I do not think a 2nd round of abx is needed at this time. Will have pt return for any worsening pain, redness, swelling, or purulent drainage. She understands.   Blanchard Kelch, MD 11/11/12 1949

## 2012-11-25 ENCOUNTER — Ambulatory Visit: Payer: Self-pay | Attending: Internal Medicine | Admitting: Internal Medicine

## 2012-11-25 VITALS — BP 132/86 | HR 76 | Temp 98.5°F | Resp 17 | Wt 141.8 lb

## 2012-11-25 DIAGNOSIS — X58XXXA Exposure to other specified factors, initial encounter: Secondary | ICD-10-CM | POA: Insufficient documentation

## 2012-11-25 DIAGNOSIS — S81002A Unspecified open wound, left knee, initial encounter: Secondary | ICD-10-CM

## 2012-11-25 DIAGNOSIS — S81009A Unspecified open wound, unspecified knee, initial encounter: Secondary | ICD-10-CM

## 2012-11-25 DIAGNOSIS — S8990XA Unspecified injury of unspecified lower leg, initial encounter: Secondary | ICD-10-CM | POA: Insufficient documentation

## 2012-11-25 MED ORDER — OXYCODONE-ACETAMINOPHEN 5-325 MG PO TABS: 1.0000 | ORAL_TABLET | Freq: Three times a day (TID) | ORAL | Status: DC | PRN

## 2012-11-25 NOTE — Patient Instructions (Signed)
Dressing Change °A dressing is a material placed over wounds. It keeps the wound clean, dry, and protected from further injury. This provides an environment that favors wound healing.  °BEFORE YOU BEGIN °· Get your supplies together. Things you may need include: °· Saline solution. °· Flexible gauze dressing. °· Medicated cream. °· Tape. °· Gloves. °· Abdominal dressing pads. °· Gauze squares. °· Plastic bags. °· Take pain medicine 30 minutes before the dressing change if you need it. °· Take a shower before you do the first dressing change of the day. Use plastic wrap or a plastic bag to prevent the dressing from getting wet. °REMOVING YOUR OLD DRESSING  °· Wash your hands with soap and water. Dry your hands with a clean towel. °· Put on your gloves. °· Remove any tape. °· Carefully remove the old dressing. If the dressing sticks, you may dampen it with warm water to loosen it, or follow your caregiver's specific directions. °· Remove any gauze or packing tape that is in your wound. °· Take off your gloves. °· Put the gloves, tape, gauze, or any packing tape into a plastic bag. °CHANGING YOUR DRESSING °· Open the supplies. °· Take the cap off the saline solution. °· Open the gauze package so that the gauze remains on the inside of the package. °· Put on your gloves. °· Clean your wound as told by your caregiver. °· If you have been told to keep your wound dry, follow those instructions. °· Your caregiver may tell you to do one or more of the following: °· Pick up the gauze. Pour the saline solution over the gauze. Squeeze out the extra saline solution. °· Put medicated cream or other medicine on your wound if you have been told to do so. °· Put the solution soaked gauze only in your wound, not on the skin around it. °· Pack your wound loosely or as told by your caregiver. °· Put dry gauze on your wound. °· Put abdominal dressing pads over the dry gauze if your wet gauze soaks through. °· Tape the abdominal dressing  pads in place so they will not fall off. Do not wrap the tape completely around the affected part (arm, leg, abdomen). °· Wrap the dressing pads with a flexible gauze dressing to secure it in place. °· Take off your gloves. Put them in the plastic bag with the old dressing. Tie the bag shut and throw it away. °· Keep the dressing clean and dry until your next dressing change. °· Wash your hands. °SEEK MEDICAL CARE IF: °· Your skin around the wound looks red. °· Your wound feels more tender or sore. °· You see pus in the wound. °· Your wound smells bad. °· You have a fever. °· Your skin around the wound has a rash that itches and burns. °· You see black or yellow skin in your wound that was not there before. °· You feel nauseous, throw up, and feel very tired. °Document Released: 04/11/2004 Document Revised: 05/27/2011 Document Reviewed: 01/14/2011 °ExitCare® Patient Information ©2014 [employer]. ° °
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
## 2012-12-02 ENCOUNTER — Ambulatory Visit: Payer: Self-pay

## 2012-12-18 ENCOUNTER — Encounter: Payer: Self-pay | Admitting: Family Medicine

## 2012-12-18 ENCOUNTER — Encounter (HOSPITAL_COMMUNITY): Payer: Self-pay | Admitting: Physical Medicine and Rehabilitation

## 2012-12-18 ENCOUNTER — Ambulatory Visit: Payer: No Typology Code available for payment source | Attending: Family Medicine | Admitting: Family Medicine

## 2012-12-18 ENCOUNTER — Emergency Department (HOSPITAL_COMMUNITY)
Admission: EM | Admit: 2012-12-18 | Discharge: 2012-12-18 | Disposition: A | Discharge status: Home / Self Care | Payer: No Typology Code available for payment source | Attending: Emergency Medicine | Admitting: Emergency Medicine

## 2012-12-18 DIAGNOSIS — M79605 Pain in left leg: Secondary | ICD-10-CM

## 2012-12-18 DIAGNOSIS — N611 Abscess of the breast and nipple: Secondary | ICD-10-CM

## 2012-12-18 DIAGNOSIS — Z79899 Other long term (current) drug therapy: Secondary | ICD-10-CM | POA: Insufficient documentation

## 2012-12-18 DIAGNOSIS — I10 Essential (primary) hypertension: Secondary | ICD-10-CM

## 2012-12-18 DIAGNOSIS — F17200 Nicotine dependence, unspecified, uncomplicated: Secondary | ICD-10-CM | POA: Insufficient documentation

## 2012-12-18 DIAGNOSIS — N61 Mastitis without abscess: Secondary | ICD-10-CM | POA: Insufficient documentation

## 2012-12-18 DIAGNOSIS — M79606 Pain in leg, unspecified: Secondary | ICD-10-CM | POA: Insufficient documentation

## 2012-12-18 DIAGNOSIS — M79609 Pain in unspecified limb: Secondary | ICD-10-CM

## 2012-12-18 MED ORDER — OXYCODONE-ACETAMINOPHEN 5-325 MG PO TABS
2.0000 | ORAL_TABLET | Freq: Once | ORAL | Status: AC
  Administered 2012-12-18: 2 via ORAL
  Filled 2012-12-18: qty 2

## 2012-12-18 MED ORDER — CLINDAMYCIN HCL 150 MG PO CAPS: 150.0000 mg | ORAL_CAPSULE | Freq: Four times a day (QID) | ORAL | Status: DC

## 2012-12-18 MED ORDER — OXYCODONE-ACETAMINOPHEN 5-325 MG PO TABS: 1.0000 | ORAL_TABLET | ORAL | Status: DC | PRN

## 2012-12-18 MED ORDER — LIDOCAINE HCL (PF) 2 % IJ SOLN
5.0000 mL | Freq: Once | INTRAMUSCULAR | Status: AC
  Administered 2012-12-18: 5 mL via INTRADERMAL

## 2012-12-18 MED ORDER — ALBUTEROL SULFATE HFA 108 (90 BASE) MCG/ACT IN AERS: 2.0000 | INHALATION_SPRAY | Freq: Four times a day (QID) | RESPIRATORY_TRACT | Status: DC | PRN

## 2012-12-18 MED ORDER — LISINOPRIL-HYDROCHLOROTHIAZIDE 10-12.5 MG PO TABS: 1.0000 | ORAL_TABLET | Freq: Every day | ORAL | Status: DC

## 2012-12-18 NOTE — Progress Notes (Signed)
PT BEING TRANSFERRED TO [facility]. REPORT GIVEN TO NURSE SERISA

## 2012-12-18 NOTE — ED Provider Notes (Signed)
CSN: [id number]     Arrival date & time 12/18/12  1223 History   First MD Initiated Contact with Patient 12/18/12 1252     Chief Complaint  Patient presents with  . Abscess   (Consider location/radiation/quality/duration/timing/severity/associated sxs/prior Treatment) HPI Comments: Jaylynne Lamond Poudrier is a 52 y.o. Female who states that she has had a swelling area of her left breast for several days. She saw her PCP, today, for evaluation of a left lower leg wound. The leg wound was revaluated, following prior presentation for same, and she was referred to a surgeon at [facility], to be evaluated for definitive surgical procedure, to treat a chronic wound. She has had recurrent left breast abscesses. The last one was 4 years ago. She denies recent fever, chills, nausea, vomiting, weakness, or dizziness. She's not tried anything for the breast problem. She has no other complaints. There are no other known modifying factors.  Patient is a 52 y.o. female presenting with abscess. The history is provided by the patient.  Abscess   Past Medical History  Diagnosis Date  . Hypertension     pt stated "every once in a while BP will be high but has not been prescribed medication for HTN.   . Seasonal allergies    Past Surgical History  Procedure Laterality Date  . Fracture surgery      L arm  . Cesarean section     No family history on file. History  Substance Use Topics  . Smoking status: Current Every Day Smoker -- 1.00 packs/day    Types: Cigarettes  . Smokeless tobacco: Not on file  . Alcohol Use: Yes     Comment: social   OB History   Grav Para Term Preterm Abortions TAB SAB Ect Mult Living                 Review of Systems  All other systems reviewed and are negative.    Allergies  Review of patient's allergies indicates no known allergies.  Home Medications   Current Outpatient Rx  Name  Route  Sig  Dispense  Refill  . albuterol (PROVENTIL HFA;VENTOLIN HFA)  108 (90 BASE) MCG/ACT inhaler   Inhalation   Inhale 2 puffs into the lungs every 6 (six) hours as needed for wheezing or shortness of breath.   1 Inhaler   4   . oxyCODONE-acetaminophen (ROXICET) 5-325 MG per tablet   Oral   Take 1 tablet by mouth every 8 (eight) hours as needed for pain.   90 tablet   0   . clindamycin (CLEOCIN) 150 MG capsule   Oral   Take 1 capsule (150 mg total) by mouth every 6 (six) hours.   28 capsule   0   . lisinopril-hydrochlorothiazide (PRINZIDE,ZESTORETIC) 10-12.5 MG per tablet   Oral   Take 1 tablet by mouth daily.   30 tablet   2   . oxyCODONE-acetaminophen (PERCOCET/ROXICET) 5-325 MG per tablet   Oral   Take 1 tablet by mouth every 4 (four) hours as needed for pain.   15 tablet   0    BP 165/88  Pulse 62  Temp(Src) 97.2 F (36.2 C) (Oral)  Resp 20  SpO2 99% Physical Exam  Nursing note and vitals reviewed. Constitutional: She is oriented to person, place, and time. She appears well-developed and well-nourished.  HENT:  Head: Normocephalic and atraumatic.  Right Ear: External ear normal.  Left Ear: External ear normal.  Eyes: Conjunctivae and  EOM are normal. Pupils are equal, round, and reactive to light.  Neck: Normal range of motion and phonation normal. Neck supple.  Cardiovascular: Normal rate.   Hypertensive  Pulmonary/Chest: Effort normal. She exhibits no bony tenderness.  Left breast with redness, induration, and fluctuance just medial to the nipple. Area of involvement is 3 x 3 cm. There is no drainage. There is no associated lymphadenopathy. There is a mild deformity overlying the area of induration, consistent with previous surgical intervention.  Abdominal: Normal appearance.  Musculoskeletal: Normal range of motion.  Neurological: She is alert and oriented to person, place, and time. No cranial nerve deficit or sensory deficit. She exhibits normal muscle tone. Coordination normal.  Skin: Skin is warm, dry and intact.   Psychiatric: She has a normal mood and affect. Her behavior is normal. Judgment and thought content normal.    ED Course  Procedures (including critical care time)  Medications  oxyCODONE-acetaminophen (PERCOCET/ROXICET) 5-325 MG per tablet 2 tablet (2 tablets Oral Given 12/18/12 1314)  lidocaine (XYLOCAINE) 2 % injection 5 mL (5 mLs Intradermal Given by Other 12/18/12 1450)     INCISION AND DRAINAGE Performed by: Richarda Blade Consent: Verbal consent obtained. Risks and benefits: risks, benefits and alternatives were discussed Type: abscess  Body area: left breast  Anesthesia: local infiltration  Incision was made with a scalpel.  Local anesthetic: lidocaine 2% without epinephrine  Anesthetic total: 2 ml  Complexity: complex Blunt dissection to break up loculations  Drainage: purulent  Drainage amount: moderate  Packing is not indicated  Patient tolerance: Patient tolerated the procedure well with no immediate complications.         MDM   1. Abscess of breast    Breast abscess, without cellulitis.  RecurrentTreated in ED. Stable for d/c.  Nursing Notes Reviewed/ Care Coordinated Applicable Imaging Reviewed Interpretation of Laboratory Data incorporated into ED treatment  Plan: Home Medications- Clindamycin, Percocet; Home Treatments- Heat TID; return here if the recommended treatment, does not improve the symptoms; Recommended follow up- PCP prn    Richarda Blade, MD 12/18/12 1559

## 2012-12-18 NOTE — Patient Instructions (Signed)
Go to the emergency room now for further evaluation and management of the breast abscess. Call the surgery office at [facility] and reschedule your appointment.
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
## 2012-12-18 NOTE — ED Notes (Signed)
Pt presents to department for evaluation of L breast abscess. Ongoing for several days. States history of same. 9/10 pain at the time. States redness and warmth to L breast. No drainage noted. Pt is alert and oriented x4.

## 2012-12-18 NOTE — ED Notes (Signed)
Pt comfortable with d/c and f/u instructions.

## 2012-12-18 NOTE — ED Notes (Signed)
Dr. Eulis Foster at bedside with this RN for examination. Pt tolerated well

## 2012-12-29 DIAGNOSIS — S81802A Unspecified open wound, left lower leg, initial encounter: Secondary | ICD-10-CM | POA: Insufficient documentation

## 2013-02-07 ENCOUNTER — Emergency Department (HOSPITAL_COMMUNITY)
Admission: EM | Admit: 2013-02-07 | Discharge: 2013-02-07 | Disposition: A | Discharge status: Home / Self Care | Payer: No Typology Code available for payment source | Attending: Emergency Medicine | Admitting: Emergency Medicine

## 2013-02-07 ENCOUNTER — Encounter (HOSPITAL_COMMUNITY): Payer: Self-pay | Admitting: Emergency Medicine

## 2013-02-07 DIAGNOSIS — Z5189 Encounter for other specified aftercare: Secondary | ICD-10-CM

## 2013-02-07 DIAGNOSIS — R110 Nausea: Secondary | ICD-10-CM | POA: Insufficient documentation

## 2013-02-07 DIAGNOSIS — G8911 Acute pain due to trauma: Secondary | ICD-10-CM | POA: Insufficient documentation

## 2013-02-07 DIAGNOSIS — Z79899 Other long term (current) drug therapy: Secondary | ICD-10-CM | POA: Insufficient documentation

## 2013-02-07 DIAGNOSIS — M79605 Pain in left leg: Secondary | ICD-10-CM

## 2013-02-07 DIAGNOSIS — Z4801 Encounter for change or removal of surgical wound dressing: Secondary | ICD-10-CM | POA: Insufficient documentation

## 2013-02-07 DIAGNOSIS — F17200 Nicotine dependence, unspecified, uncomplicated: Secondary | ICD-10-CM | POA: Insufficient documentation

## 2013-02-07 DIAGNOSIS — I10 Essential (primary) hypertension: Secondary | ICD-10-CM | POA: Insufficient documentation

## 2013-02-07 DIAGNOSIS — M79609 Pain in unspecified limb: Secondary | ICD-10-CM | POA: Insufficient documentation

## 2013-02-07 DIAGNOSIS — Z792 Long term (current) use of antibiotics: Secondary | ICD-10-CM | POA: Insufficient documentation

## 2013-02-07 MED ORDER — OXYCODONE-ACETAMINOPHEN 5-325 MG PO TABS: 1.0000 | ORAL_TABLET | Freq: Four times a day (QID) | ORAL | Status: DC | PRN

## 2013-02-07 MED ORDER — OXYCODONE-ACETAMINOPHEN 5-325 MG PO TABS
1.0000 | ORAL_TABLET | Freq: Once | ORAL | Status: AC
  Administered 2013-02-07: 1 via ORAL
  Filled 2013-02-07: qty 1
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
## 2013-02-10 NOTE — ED Provider Notes (Signed)
Medical screening examination/treatment/procedure(s) were performed by non-physician practitioner and as supervising physician I was immediately available for consultation/collaboration.  EKG Interpretation   None         Lebron Quam, MD 02/10/13 [id number]

## 2013-02-23 ENCOUNTER — Ambulatory Visit: Payer: Self-pay | Admitting: Internal Medicine

## 2013-05-27 ENCOUNTER — Ambulatory Visit: Payer: Self-pay | Admitting: Internal Medicine

## 2013-06-08 ENCOUNTER — Encounter: Payer: Self-pay | Admitting: Internal Medicine

## 2013-06-08 ENCOUNTER — Ambulatory Visit: Payer: Self-pay | Attending: Internal Medicine | Admitting: Internal Medicine

## 2013-06-08 VITALS — BP 150/80 | HR 85 | Temp 98.5°F | Resp 17 | Wt 139.6 lb

## 2013-06-08 DIAGNOSIS — F17200 Nicotine dependence, unspecified, uncomplicated: Secondary | ICD-10-CM

## 2013-06-08 DIAGNOSIS — G8929 Other chronic pain: Secondary | ICD-10-CM

## 2013-06-08 DIAGNOSIS — I10 Essential (primary) hypertension: Secondary | ICD-10-CM

## 2013-06-08 DIAGNOSIS — M2550 Pain in unspecified joint: Secondary | ICD-10-CM

## 2013-06-08 DIAGNOSIS — M79606 Pain in leg, unspecified: Secondary | ICD-10-CM

## 2013-06-08 DIAGNOSIS — M7918 Myalgia, other site: Secondary | ICD-10-CM

## 2013-06-08 DIAGNOSIS — Z720 Tobacco use: Secondary | ICD-10-CM | POA: Insufficient documentation

## 2013-06-08 DIAGNOSIS — R030 Elevated blood-pressure reading, without diagnosis of hypertension: Secondary | ICD-10-CM

## 2013-06-08 DIAGNOSIS — IMO0001 Reserved for inherently not codable concepts without codable children: Secondary | ICD-10-CM

## 2013-06-08 DIAGNOSIS — M79609 Pain in unspecified limb: Secondary | ICD-10-CM

## 2013-06-08 LAB — COMPLETE METABOLIC PANEL WITH GFR
ALT: 52 U/L — ABNORMAL HIGH (ref 0–35)
AST: 53 U/L — ABNORMAL HIGH (ref 0–37)
Albumin: 4 g/dL (ref 3.5–5.2)
Alkaline Phosphatase: 87 U/L (ref 39–117)
BUN: 15 mg/dL (ref 6–23)
CO2: 27 meq/L (ref 19–32)
Calcium: 10 mg/dL (ref 8.4–10.5)
Chloride: 100 mEq/L (ref 96–112)
Creat: 0.76 mg/dL (ref 0.50–1.10)
GFR, Est Non African American: >89 mL/min
GLUCOSE: 89 mg/dL (ref 70–99)
Potassium: 4 mEq/L (ref 3.5–5.3)
SODIUM: 139 meq/L (ref 135–145)
TOTAL PROTEIN: 7.5 g/dL (ref 6.0–8.3)
Total Bilirubin: 0.4 mg/dL (ref 0.2–1.2)

## 2013-06-08 MED ORDER — CLONIDINE HCL 0.1 MG PO TABS
0.1000 mg | ORAL_TABLET | Freq: Once | ORAL | Status: AC
  Administered 2013-06-08: 0.1 mg via ORAL

## 2013-06-08 MED ORDER — NICOTINE 21 MG/24HR TD PT24: 21.0000 mg | MEDICATED_PATCH | Freq: Every day | TRANSDERMAL | Status: DC

## 2013-06-08 MED ORDER — LISINOPRIL-HYDROCHLOROTHIAZIDE 10-12.5 MG PO TABS: 1.0000 | ORAL_TABLET | Freq: Every day | ORAL | Status: DC

## 2013-06-08 MED ORDER — ALBUTEROL SULFATE HFA 108 (90 BASE) MCG/ACT IN AERS: 2.0000 | INHALATION_SPRAY | Freq: Four times a day (QID) | RESPIRATORY_TRACT | Status: DC | PRN

## 2013-06-08 MED ORDER — GABAPENTIN 300 MG PO CAPS: 300.0000 mg | ORAL_CAPSULE | Freq: Three times a day (TID) | ORAL | Status: DC

## 2013-06-08 MED ORDER — IBUPROFEN 800 MG PO TABS: 800.0000 mg | ORAL_TABLET | Freq: Three times a day (TID) | ORAL | Status: DC | PRN

## 2013-06-08 NOTE — Progress Notes (Signed)
Patient her for follow up Complains of shoulder pain Back pain  Pain in arms Some numbness in her fingers
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
## 2013-07-27 ENCOUNTER — Emergency Department (HOSPITAL_COMMUNITY)
Admission: EM | Admit: 2013-07-27 | Discharge: 2013-07-27 | Disposition: A | Discharge status: Home / Self Care | Payer: No Typology Code available for payment source | Attending: Emergency Medicine | Admitting: Emergency Medicine

## 2013-07-27 ENCOUNTER — Encounter (HOSPITAL_COMMUNITY): Payer: Self-pay | Admitting: Emergency Medicine

## 2013-07-27 DIAGNOSIS — Z79899 Other long term (current) drug therapy: Secondary | ICD-10-CM | POA: Insufficient documentation

## 2013-07-27 DIAGNOSIS — F17200 Nicotine dependence, unspecified, uncomplicated: Secondary | ICD-10-CM | POA: Insufficient documentation

## 2013-07-27 DIAGNOSIS — Z792 Long term (current) use of antibiotics: Secondary | ICD-10-CM | POA: Insufficient documentation

## 2013-07-27 DIAGNOSIS — F10929 Alcohol use, unspecified with intoxication, unspecified: Secondary | ICD-10-CM

## 2013-07-27 DIAGNOSIS — I10 Essential (primary) hypertension: Secondary | ICD-10-CM | POA: Insufficient documentation

## 2013-07-27 DIAGNOSIS — F919 Conduct disorder, unspecified: Secondary | ICD-10-CM | POA: Insufficient documentation

## 2013-07-27 DIAGNOSIS — F911 Conduct disorder, childhood-onset type: Secondary | ICD-10-CM | POA: Insufficient documentation

## 2013-07-27 DIAGNOSIS — S51809A Unspecified open wound of unspecified forearm, initial encounter: Secondary | ICD-10-CM | POA: Insufficient documentation

## 2013-07-27 DIAGNOSIS — Z7289 Other problems related to lifestyle: Secondary | ICD-10-CM

## 2013-07-27 DIAGNOSIS — Z8709 Personal history of other diseases of the respiratory system: Secondary | ICD-10-CM | POA: Insufficient documentation

## 2013-07-27 DIAGNOSIS — X789XXA Intentional self-harm by unspecified sharp object, initial encounter: Secondary | ICD-10-CM | POA: Insufficient documentation

## 2013-07-27 DIAGNOSIS — F1010 Alcohol abuse, uncomplicated: Secondary | ICD-10-CM | POA: Insufficient documentation

## 2013-07-27 HISTORY — DX: Alcohol abuse, uncomplicated: F10.10
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
## 2013-07-27 NOTE — Discharge Instructions (Signed)
Alcohol Intoxication Alcohol intoxication occurs when you drink enough alcohol that it affects your ability to function. It can be mild or very severe. Drinking a lot of alcohol in a short time is called binge drinking. This can be very harmful. Drinking alcohol can also be more dangerous if you are taking medicines or other drugs. Some of the effects caused by alcohol may include:  Loss of coordination.  Changes in mood and behavior.  Unclear thinking.  Trouble talking (slurred speech).  Throwing up (vomiting).  Confusion.  Slowed breathing.  Twitching and shaking (seizures).  Loss of consciousness. HOME CARE  Do not drive after drinking alcohol.  Drink enough water and fluids to keep your pee (urine) clear or pale yellow. Avoid caffeine.  Only take medicine as told by your doctor. GET HELP IF:  You throw up (vomit) many times.  You do not feel better after a few days.  You frequently have alcohol intoxication. Your doctor can help decide if you should see a substance use treatment counselor. GET HELP RIGHT AWAY IF:  You become shaky when you stop drinking.  You have twitching and shaking.  You throw up blood. It may look bright red or like coffee grounds.  You notice blood in your poop (bowel movements).  You become lightheaded or pass out (faint). MAKE SURE YOU:   Understand these instructions.  Will watch your condition.  Will get help right away if you are not doing well or get worse. Document Released: 08/21/2007 Document Revised: 11/04/2012 Document Reviewed: 08/07/2012 [facility] Patient Information 2014 [place]. Wound Care Wound care helps prevent pain and infection.  You may need a tetanus shot if:  You cannot remember when you had your last tetanus shot.  You have never had a tetanus shot.  The injury broke your skin. If you need a tetanus shot and you choose not to have one, you may get tetanus. Sickness from tetanus can be  serious. HOME CARE   Only take medicine as told by your doctor.  Clean the wound daily with mild soap and water.  Change any bandages (dressings) as told by your doctor.  Put medicated cream and a bandage on the wound as told by your doctor.  Change the bandage if it gets wet, dirty, or starts to smell.  Take showers. Do not take baths, swim, or do anything that puts your wound under water.  Rest and raise (elevate) the wound until the pain and puffiness (swelling) are better.  Keep all doctor visits as told. GET HELP RIGHT AWAY IF:   Yellowish-white fluid (pus) comes from the wound.  Medicine does not lessen your pain.  There is a red streak going away from the wound.  You have a fever. MAKE SURE YOU:   Understand these instructions.  Will watch your condition.  Will get help right away if you are not doing well or get worse. Document Released: 12/12/2007 Document Revised: 05/27/2011 Document Reviewed: 07/08/2010 [facility] Patient Information 2014 [place], [place].
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
## 2013-08-02 NOTE — ED Provider Notes (Signed)
Medical screening examination/treatment/procedure(s) were performed by non-physician practitioner and as supervising physician I was immediately available for consultation/collaboration.  Leota Jacobsen, MD 08/02/13 [phone]

## 2013-11-10 ENCOUNTER — Emergency Department (HOSPITAL_COMMUNITY): Payer: Self-pay

## 2013-11-10 ENCOUNTER — Encounter (HOSPITAL_COMMUNITY): Payer: Self-pay | Admitting: Emergency Medicine

## 2013-11-10 ENCOUNTER — Emergency Department (HOSPITAL_COMMUNITY)
Admission: EM | Admit: 2013-11-10 | Discharge: 2013-11-10 | Disposition: A | Discharge status: Home / Self Care | Payer: Self-pay | Attending: Family Medicine | Admitting: Family Medicine

## 2013-11-10 DIAGNOSIS — I8390 Asymptomatic varicose veins of unspecified lower extremity: Secondary | ICD-10-CM | POA: Insufficient documentation

## 2013-11-10 DIAGNOSIS — S92351A Displaced fracture of fifth metatarsal bone, right foot, initial encounter for closed fracture: Secondary | ICD-10-CM

## 2013-11-10 DIAGNOSIS — S99929A Unspecified injury of unspecified foot, initial encounter: Secondary | ICD-10-CM

## 2013-11-10 DIAGNOSIS — Y929 Unspecified place or not applicable: Secondary | ICD-10-CM | POA: Insufficient documentation

## 2013-11-10 DIAGNOSIS — S8990XA Unspecified injury of unspecified lower leg, initial encounter: Secondary | ICD-10-CM | POA: Insufficient documentation

## 2013-11-10 DIAGNOSIS — Z79899 Other long term (current) drug therapy: Secondary | ICD-10-CM | POA: Insufficient documentation

## 2013-11-10 DIAGNOSIS — X500XXA Overexertion from strenuous movement or load, initial encounter: Secondary | ICD-10-CM | POA: Insufficient documentation

## 2013-11-10 DIAGNOSIS — Z8709 Personal history of other diseases of the respiratory system: Secondary | ICD-10-CM | POA: Insufficient documentation

## 2013-11-10 DIAGNOSIS — Y9301 Activity, walking, marching and hiking: Secondary | ICD-10-CM | POA: Insufficient documentation

## 2013-11-10 DIAGNOSIS — I10 Essential (primary) hypertension: Secondary | ICD-10-CM | POA: Insufficient documentation

## 2013-11-10 DIAGNOSIS — S99919A Unspecified injury of unspecified ankle, initial encounter: Secondary | ICD-10-CM

## 2013-11-10 DIAGNOSIS — S92309A Fracture of unspecified metatarsal bone(s), unspecified foot, initial encounter for closed fracture: Secondary | ICD-10-CM | POA: Insufficient documentation

## 2013-11-10 DIAGNOSIS — F17200 Nicotine dependence, unspecified, uncomplicated: Secondary | ICD-10-CM | POA: Insufficient documentation

## 2013-11-10 MED ORDER — OXYCODONE-ACETAMINOPHEN 5-325 MG PO TABS: 1.0000 | ORAL_TABLET | ORAL | Status: DC | PRN

## 2013-11-10 NOTE — ED Notes (Signed)
Pt shown how to apply cam walker and use crutches. Pt verbalized understanding.

## 2013-11-10 NOTE — Discharge Instructions (Signed)
Metatarsal Fracture, Undisplaced  A metatarsal fracture is a break in the bone(s) of the foot. These are the bones of the foot that connect your toes to the bones of the ankle.  DIAGNOSIS   The diagnoses of these fractures are usually made with X-rays. If there are problems in the forefoot and x-rays are normal a later bone scan will usually make the diagnosis.   TREATMENT AND HOME CARE INSTRUCTIONS  · Treatment may or may not include a cast or walking shoe. When casts are needed the use is usually for short periods of time so as not to slow down healing with muscle wasting (atrophy).  · Activities should be stopped until further advised by your caregiver.  · Wear shoes with adequate shock absorbing capabilities and stiff soles.  · Alternative exercise may be undertaken while waiting for healing. These may include bicycling and swimming, or as your caregiver suggests.  · It is important to keep all follow-up visits or specialty referrals. The failure to keep these appointments could result in improper bone healing and chronic pain or disability.  · Warning: Do not drive a car or operate a motor vehicle until your caregiver specifically tells you it is safe to do so.  IF YOU DO NOT HAVE A CAST OR SPLINT:  · You may walk on your injured foot as tolerated or advised.  · Do not put any weight on your injured foot for as [place] as directed by your caregiver. Slowly increase the amount of time you walk on the foot as the pain allows or as advised.  · Use crutches until you can bear weight without pain. A gradual increase in weight bearing may help.  · Apply ice to the injury for 15-20 minutes each hour while awake for the first 2 days. Put the ice in a plastic bag and place a towel between the bag of ice and your skin.  · Only take over-the-counter or prescription medicines for pain, discomfort, or fever as directed by your caregiver.  SEEK IMMEDIATE MEDICAL CARE IF:   · Your cast gets damaged or breaks.  · You have  continued severe pain or more swelling than you did before the cast was put on, or the pain is not controlled with medications.  · Your skin or nails below the injury turn blue or grey, or feel cold or numb.  · There is a bad smell, or new stains or pus-like (purulent) drainage coming from the cast.  MAKE SURE YOU:   · Understand these instructions.  · Will watch your condition.  · Will get help right away if you are not doing well or get worse.  Document Released: 11/24/2001 Document Revised: 05/27/2011 Document Reviewed: 10/16/2007  [employer]® Patient Information ©2015 [employer]. This information is not intended to replace advice given to you by your health care provider. Make sure you discuss any questions you have with your health care provider.

## 2013-11-10 NOTE — ED Notes (Signed)
Right foot pain, twisted foot on the curbing.  Reports this ankle was twisted several months ago.  Too painful to move toes too much.  Pedal pulses 2 plus.  Does move toes, but they cause pain in right foot

## 2013-11-10 NOTE — ED Provider Notes (Signed)
CSN: [id number]     Arrival date & time 11/10/13  1901 History   First MD Initiated Contact with Patient 11/10/13 1933     Chief Complaint  Patient presents with  . Foot Pain     (Consider location/radiation/quality/duration/timing/severity/associated sxs/prior Treatment) HPI Comments: 53 year old female presents complaining of right foot pain and swelling. She was walking yesterday when she fell off a curb and twisted her ankle. She now has pain and swelling in the lateral portion of the right foot. The pain is increased with any ambulation and she cannot move her toes. She is also having subjective numbness in the foot. She believes she has fractured her foot. She is similar injury about 2-3 months ago, she says this injury is much more severe than her previous injury was.  denies any other injury  Patient is a 53 y.o. female presenting with lower extremity pain.  Foot Pain Associated symptoms include arthralgias, joint swelling and myalgias.    Past Medical History  Diagnosis Date  . Hypertension     pt stated "every once in a while BP will be high but has not been prescribed medication for HTN.   . Seasonal allergies   . Alcohol abuse    Past Surgical History  Procedure Laterality Date  . Fracture surgery      L arm  . Cesarean section     No family history on file. History  Substance Use Topics  . Smoking status: Current Every Day Smoker -- 1.00 packs/day    Types: Cigarettes  . Smokeless tobacco: Not on file  . Alcohol Use: 3.6 oz/week    6 Cans of beer per week   OB History   Grav Para Term Preterm Abortions TAB SAB Ect Mult Living                 Review of Systems  Musculoskeletal: Positive for arthralgias, joint swelling and myalgias.       See HPI  All other systems reviewed and are negative.     Allergies  Review of patient's allergies indicates no known allergies.  Home Medications   Prior to Admission medications   Medication Sig Start Date End  Date Taking? Authorizing Provider  albuterol (PROVENTIL HFA;VENTOLIN HFA) 108 (90 BASE) MCG/ACT inhaler Inhale 2 puffs into the lungs every 6 (six) hours as needed for wheezing or shortness of breath. 06/08/13  Yes Deepak Advani, MD  gabapentin (NEURONTIN) 300 MG capsule Take 300 mg by mouth 3 (three) times daily. 06/08/13  Yes Lorayne Marek, MD  ibuprofen (ADVIL,MOTRIN) 800 MG tablet Take 800 mg by mouth every 8 (eight) hours as needed for moderate pain. 06/08/13  Yes Lorayne Marek, MD  oxyCODONE-acetaminophen (PERCOCET/ROXICET) 5-325 MG per tablet Take 1 tablet by mouth every 4 (four) hours as needed for moderate pain or severe pain. 11/10/13   Freeman Caldron Kenard Morawski, PA-C   BP 167/85  Pulse 91  Temp(Src) 98.6 F (37 C) (Oral)  Resp 18  SpO2 100% Physical Exam  Nursing note and vitals reviewed. Constitutional: She is oriented to person, place, and time. Vital signs are normal. She appears well-developed and well-nourished. No distress.  HENT:  Head: Normocephalic and atraumatic.  Cardiovascular:  Pulses:      Dorsalis pedis pulses are 2+ on the right side.  Bilateral lower extremity varicosities  Pulmonary/Chest: Effort normal. No respiratory distress.  Musculoskeletal:       Right ankle: She exhibits decreased range of motion and swelling (lateral ). She exhibits  no deformity and normal pulse. Tenderness. Lateral malleolus and AITFL tenderness found. No medial malleolus tenderness found. Achilles tendon normal.       Right foot: She exhibits decreased range of motion, tenderness (diffuse, worse superior-lateral ), bony tenderness and swelling. She exhibits normal capillary refill and no deformity.  Neurological: She is alert and oriented to person, place, and time. She has normal strength. No cranial nerve deficit or sensory deficit. Gait (unable to bear weight on right foot ) abnormal. Coordination normal.  patient witnessed walking, but with a limp  Skin: Skin is warm and dry. No rash noted. She  is not diaphoretic.  Psychiatric: She has a normal mood and affect. Judgment normal.    ED Course  Procedures (including critical care time) Labs Review Labs Reviewed - No data to display  Imaging Review Dg Ankle Complete Right  11/10/2013   CLINICAL DATA:  Twisted right ankle and foot lock for.  EXAM: RIGHT ANKLE - COMPLETE 3+ VIEW  COMPARISON:  09/25/2008.  FINDINGS: Three view exam of the right ankle shows a transverse fracture through the base of the fifth metatarsal. Distal tibia and distal fibula are intact. Ankle mortise is preserved.  IMPRESSION: Transverse fracture involving the base of the fifth metatarsal.   Electronically Signed   By: Misty Stanley M.D.   On: 11/10/2013 20:50   Dg Foot Complete Right  11/10/2013   CLINICAL DATA:  Twisting injury right foot.  Pain.  EXAM: RIGHT FOOT COMPLETE - 3+ VIEW  COMPARISON:  None.  FINDINGS: The patient has a minimally distracted fracture through the base of the fifth metatarsal. No other acute bony or joint abnormality is identified. Soft tissue swelling is seen.  IMPRESSION: Acute, mildly distracted fracture base of fifth metatarsal.   Electronically Signed   By: Inge Rise M.D.   On: 11/10/2013 20:47     EKG Interpretation None      MDM   Final diagnoses:  Fracture of fifth metatarsal bone of right foot, closed, initial encounter    XR reveals proximal 5th metatarsal fracture.  Will treat with cam walker boot and crutches, minimal weight bearing, follow up with ortho.     Meds ordered this encounter  Medications  . ibuprofen (ADVIL,MOTRIN) 800 MG tablet    Sig: Take 800 mg by mouth every 8 (eight) hours as needed for moderate pain.  Marland Kitchen gabapentin (NEURONTIN) 300 MG capsule    Sig: Take 300 mg by mouth 3 (three) times daily.  Marland Kitchen albuterol (PROVENTIL HFA;VENTOLIN HFA) 108 (90 BASE) MCG/ACT inhaler    Sig: Inhale 2 puffs into the lungs every 6 (six) hours as needed for wheezing or shortness of breath.  .  oxyCODONE-acetaminophen (PERCOCET/ROXICET) 5-325 MG per tablet    Sig: Take 1 tablet by mouth every 4 (four) hours as needed for moderate pain or severe pain.    Dispense:  20 tablet    Refill:  0    Order Specific Question:  Supervising Provider    Answer:  Ihor Gully D [place], PA-C 11/10/13 2056

## 2013-11-16 NOTE — ED Provider Notes (Signed)
Medical screening examination/treatment/procedure(s) were performed by resident physician or non-physician practitioner and as supervising physician I was immediately available for consultation/collaboration.   Pauline Good MD.   Billy Fischer, MD 11/16/13 1226

## 2013-11-23 ENCOUNTER — Ambulatory Visit: Payer: Self-pay | Admitting: Internal Medicine

## 2014-02-08 ENCOUNTER — Ambulatory Visit: Payer: Self-pay | Attending: Internal Medicine | Admitting: Internal Medicine

## 2014-02-08 ENCOUNTER — Encounter: Payer: Self-pay | Admitting: Internal Medicine

## 2014-02-08 VITALS — BP 134/94 | HR 98 | Temp 98.4°F | Resp 17 | Ht 67.5 in | Wt 131.8 lb

## 2014-02-08 DIAGNOSIS — Z79899 Other long term (current) drug therapy: Secondary | ICD-10-CM | POA: Insufficient documentation

## 2014-02-08 DIAGNOSIS — J209 Acute bronchitis, unspecified: Secondary | ICD-10-CM

## 2014-02-08 DIAGNOSIS — F17210 Nicotine dependence, cigarettes, uncomplicated: Secondary | ICD-10-CM | POA: Insufficient documentation

## 2014-02-08 DIAGNOSIS — N644 Mastodynia: Secondary | ICD-10-CM

## 2014-02-08 DIAGNOSIS — Z139 Encounter for screening, unspecified: Secondary | ICD-10-CM

## 2014-02-08 DIAGNOSIS — Z760 Encounter for issue of repeat prescription: Secondary | ICD-10-CM | POA: Insufficient documentation

## 2014-02-08 DIAGNOSIS — I10 Essential (primary) hypertension: Secondary | ICD-10-CM

## 2014-02-08 DIAGNOSIS — R062 Wheezing: Secondary | ICD-10-CM

## 2014-02-08 DIAGNOSIS — R059 Cough, unspecified: Secondary | ICD-10-CM

## 2014-02-08 DIAGNOSIS — R05 Cough: Secondary | ICD-10-CM | POA: Insufficient documentation

## 2014-02-08 LAB — COMPLETE METABOLIC PANEL WITH GFR
ALBUMIN: 4.2 g/dL (ref 3.5–5.2)
ALT: 102 U/L — ABNORMAL HIGH (ref 0–35)
AST: 126 U/L — ABNORMAL HIGH (ref 0–37)
Alkaline Phosphatase: 98 U/L (ref 39–117)
BUN: 21 mg/dL (ref 6–23)
CHLORIDE: 98 meq/L (ref 96–112)
CO2: 24 mEq/L (ref 19–32)
CREATININE: 1.27 mg/dL — AB (ref 0.50–1.10)
Calcium: 9.2 mg/dL (ref 8.4–10.5)
GFR, Est African American: 56 mL/min — ABNORMAL LOW
GFR, Est Non African American: 48 mL/min — ABNORMAL LOW
Glucose, Bld: 71 mg/dL (ref 70–99)
Potassium: 3.8 mEq/L (ref 3.5–5.3)
SODIUM: 131 meq/L — AB (ref 135–145)
TOTAL PROTEIN: 7.8 g/dL (ref 6.0–8.3)
Total Bilirubin: 0.6 mg/dL (ref 0.2–1.2)

## 2014-02-08 MED ORDER — SULFAMETHOXAZOLE-TRIMETHOPRIM 800-160 MG PO TABS: 1.0000 | ORAL_TABLET | Freq: Two times a day (BID) | ORAL | Status: DC

## 2014-02-08 MED ORDER — GABAPENTIN 300 MG PO CAPS: 300.0000 mg | ORAL_CAPSULE | Freq: Three times a day (TID) | ORAL | Status: DC

## 2014-02-08 MED ORDER — LISINOPRIL-HYDROCHLOROTHIAZIDE 10-12.5 MG PO TABS: 1.0000 | ORAL_TABLET | Freq: Every day | ORAL | Status: DC

## 2014-02-08 MED ORDER — BENZONATATE 100 MG PO CAPS: 100.0000 mg | ORAL_CAPSULE | Freq: Three times a day (TID) | ORAL | Status: DC | PRN

## 2014-02-08 MED ORDER — ALBUTEROL SULFATE HFA 108 (90 BASE) MCG/ACT IN AERS: 2.0000 | INHALATION_SPRAY | Freq: Four times a day (QID) | RESPIRATORY_TRACT | Status: DC | PRN

## 2014-02-08 NOTE — Progress Notes (Signed)
Patient c/o SOB x 3-4 months that comes and goes.  She states it gets worse on exertion.  C/o left breast pain, which she states she has an abscess on it that she noticed x 3 weeks. C/o cough x 3-4 months that is progressing.  She stated she has taken Mucinex but it is not helped.

## 2014-02-08 NOTE — Progress Notes (Signed)
MRN: [id number] Name: [name]  Sex: female Age: 53 y.o. DOB: 11-24-1960  Allergies: Review of patient's allergies indicates no known allergies.  Chief Complaint  Patient presents with  . Cough  . Breast Pain  . Medication Refill    HPI: Patient is 53 y.o. female who history of hypertension, comes today for followup reported to have cough ongoing for the last few weeks, she also uses albuterol and she ran out of her medication, has been using over-the-counter cold medications without much improvement, patient still smokes cigarettes,she denies any fever chills, she also complains of left breast soreness, denies any discharge,patient has not followed with her gynecologist and has not had a Pap smear done recently, she is also requesting refill on her medications.  Past Medical History  Diagnosis Date  . Hypertension     pt stated "every once in a while BP will be high but has not been prescribed medication for HTN.   . Seasonal allergies   . Alcohol abuse     Past Surgical History  Procedure Laterality Date  . Fracture surgery      L arm  . Cesarean section        Medication List       This list is accurate as of: 02/08/14  1:03 PM.  Always use your most recent med list.               albuterol 108 (90 BASE) MCG/ACT inhaler  Commonly known as:  PROVENTIL HFA;VENTOLIN HFA  Inhale 2 puffs into the lungs every 6 (six) hours as needed for wheezing or shortness of breath.     benzonatate 100 MG capsule  Commonly known as:  TESSALON  Take 1 capsule (100 mg total) by mouth 3 (three) times daily as needed for cough.     gabapentin 300 MG capsule  Commonly known as:  NEURONTIN  Take 1 capsule (300 mg total) by mouth 3 (three) times daily.     ibuprofen 800 MG tablet  Commonly known as:  ADVIL,MOTRIN  Take 800 mg by mouth every 8 (eight) hours as needed for moderate pain.     lisinopril-hydrochlorothiazide 10-12.5 MG per tablet  Commonly known as:   PRINZIDE,ZESTORETIC  Take 1 tablet by mouth daily.     oxyCODONE-acetaminophen 5-325 MG per tablet  Commonly known as:  PERCOCET/ROXICET  Take 1 tablet by mouth every 4 (four) hours as needed for moderate pain or severe pain.     sulfamethoxazole-trimethoprim 800-160 MG per tablet  Commonly known as:  BACTRIM DS,SEPTRA DS  Take 1 tablet by mouth 2 (two) times daily.        Meds ordered this encounter  Medications  . albuterol (PROVENTIL HFA;VENTOLIN HFA) 108 (90 BASE) MCG/ACT inhaler    Sig: Inhale 2 puffs into the lungs every 6 (six) hours as needed for wheezing or shortness of breath.    Dispense:  18 g    Refill:  3  . gabapentin (NEURONTIN) 300 MG capsule    Sig: Take 1 capsule (300 mg total) by mouth 3 (three) times daily.    Dispense:  20 capsule    Refill:  0  . sulfamethoxazole-trimethoprim (BACTRIM DS,SEPTRA DS) 800-160 MG per tablet    Sig: Take 1 tablet by mouth 2 (two) times daily.    Dispense:  20 tablet    Refill:  0  . lisinopril-hydrochlorothiazide (PRINZIDE,ZESTORETIC) 10-12.5 MG per tablet    Sig: Take 1 tablet by mouth daily.  Dispense:  90 tablet    Refill:  3  . benzonatate (TESSALON) 100 MG capsule    Sig: Take 1 capsule (100 mg total) by mouth 3 (three) times daily as needed for cough.    Dispense:  30 capsule    Refill:  1    Immunization History  Administered Date(s) Administered  . Tdap 09/16/2012    History reviewed. No pertinent family history.  History  Substance Use Topics  . Smoking status: Current Every Day Smoker -- 1.00 packs/day    Types: Cigarettes  . Smokeless tobacco: Not on file  . Alcohol Use: 3.6 oz/week    6 Cans of beer per week    Review of Systems   As noted in HPI  Filed Vitals:   02/08/14 1234  BP: 134/94  Pulse: 98  Temp: 98.4 F (36.9 C)  Resp: 17    Physical Exam  Physical Exam  Constitutional: No distress.  Eyes: EOM are normal. Pupils are equal, round, and reactive to light.  Cardiovascular:  Normal rate and regular rhythm.   Pulmonary/Chest: No respiratory distress. She has no rales.  Minimal wheezing  Musculoskeletal: She exhibits no edema.    CBC    Component Value Date/Time   WBC 4.6 10/29/2012 0720   RBC 3.93 10/29/2012 0720   HGB 14.5 10/29/2012 0720   HCT 40.0 10/29/2012 0720   PLT 121* 10/29/2012 0720   MCV 101.8* 10/29/2012 0720   LYMPHSABS 1.1 10/28/2012 1625   MONOABS 1.0 10/28/2012 1625   EOSABS 0.0 10/28/2012 1625   BASOSABS 0.0 10/28/2012 1625    CMP     Component Value Date/Time   NA 139 06/08/2013 1033   K 4.0 06/08/2013 1033   CL 100 06/08/2013 1033   CO2 27 06/08/2013 1033   GLUCOSE 89 06/08/2013 1033   BUN 15 06/08/2013 1033   CREATININE 0.76 06/08/2013 1033   CREATININE 0.52 10/29/2012 1050   CALCIUM 10.0 06/08/2013 1033   PROT 7.5 06/08/2013 1033   ALBUMIN 4.0 06/08/2013 1033   AST 53* 06/08/2013 1033   ALT 52* 06/08/2013 1033   ALKPHOS 87 06/08/2013 1033   BILITOT 0.4 06/08/2013 1033   GFRNONAA >89 06/08/2013 1033   GFRNONAA >90 10/29/2012 1050   GFRAA >89 06/08/2013 1033   GFRAA >90 10/29/2012 1050    No results found for: CHOL  No components found for: HGA1C  Lab Results  Component Value Date/Time   AST 53* 06/08/2013 10:33 AM    Assessment and Plan  Essential hypertension - Plan:continue with lisinopril-hydrochlorothiazide (PRINZIDE,ZESTORETIC) 10-12.5 MG per tablet, COMPLETE METABOLIC PANEL WITH GFR  Nipple soreness - Plan: we'll treat her for mastitis sulfamethoxazole-trimethoprim (BACTRIM DS,SEPTRA DS) 800-160 MG per tablet, also Ambulatory referral to Gynecology, advise patient to get immediate medical attention if she has any worsening of the symptoms, she understands verbalized instructions.  Cough - Plan: benzonatate (TESSALON) 100 MG capsule  Wheezing - Plan: albuterol (PROVENTIL HFA;VENTOLIN HFA) 108 (90 BASE) MCG/ACT inhaler  Screening - Plan: Ambulatory referral to Gynecology, for routine checkup and Pap  smear.  Acute bronchitis, unspecified organism - Plan: sulfamethoxazole-trimethoprim (BACTRIM DS,SEPTRA DS) 800-160 MG per tablet     Return in about 3 months (around 05/11/2014) for hypertension.  Lorayne Marek, MD

## 2014-02-14 ENCOUNTER — Encounter: Payer: Self-pay | Admitting: Obstetrics & Gynecology

## 2014-03-08 ENCOUNTER — Telehealth: Payer: Self-pay | Admitting: Emergency Medicine

## 2014-03-08 DIAGNOSIS — B1710 Acute hepatitis C without hepatic coma: Secondary | ICD-10-CM

## 2014-03-08 NOTE — Telephone Encounter (Signed)
-----   Message from Lorayne Marek, MD sent at 02/09/2014 11:56 AM EST ----- Call and let the patient know that her blood work shows elevated liver enzymes, as per chart she was diagnosed with hepatitis C in the past, check with the patient if she is following up with specialist. If not put in the referral to ID.

## 2014-03-08 NOTE — Telephone Encounter (Signed)
Pt given lab results. States she has not been seen by ID specialist Referral placed

## 2014-03-22 ENCOUNTER — Other Ambulatory Visit: Payer: Self-pay

## 2014-03-22 DIAGNOSIS — B182 Chronic viral hepatitis C: Secondary | ICD-10-CM

## 2014-03-22 LAB — CBC WITH DIFFERENTIAL/PLATELET
Basophils Absolute: 0.1 10*3/uL (ref 0.0–0.1)
Basophils Relative: 1 % (ref 0–1)
EOS ABS: 0.1 10*3/uL (ref 0.0–0.7)
Eosinophils Relative: 2 % (ref 0–5)
HCT: 37.9 % (ref 36.0–46.0)
Hemoglobin: 12.9 g/dL (ref 12.0–15.0)
LYMPHS ABS: 2 10*3/uL (ref 0.7–4.0)
LYMPHS PCT: 37 % (ref 12–46)
MCH: 33.9 pg (ref 26.0–34.0)
MCHC: 34 g/dL (ref 30.0–36.0)
MCV: 99.7 fL (ref 78.0–100.0)
MONOS PCT: 11 % (ref 3–12)
MPV: 10.2 fL (ref 8.6–12.4)
Monocytes Absolute: 0.6 10*3/uL (ref 0.1–1.0)
NEUTROS PCT: 49 % (ref 43–77)
Neutro Abs: 2.7 10*3/uL (ref 1.7–7.7)
Platelets: 316 10*3/uL (ref 150–400)
RBC: 3.8 MIL/uL — ABNORMAL LOW (ref 3.87–5.11)
RDW: 14.2 % (ref 11.5–15.5)
WBC: 5.5 10*3/uL (ref 4.0–10.5)

## 2014-03-22 LAB — IRON: IRON: 101 ug/dL (ref 42–145)

## 2014-03-23 LAB — HEPATITIS B SURFACE ANTIBODY,QUALITATIVE: Hep B S Ab: NEGATIVE

## 2014-03-23 LAB — ANTI-NUCLEAR AB-TITER (ANA TITER): ANA TITER 1: NEGATIVE (ref <1:40)

## 2014-03-23 LAB — PROTIME-INR
INR: 0.94 (ref <1.50)
Prothrombin Time: 12.6 seconds (ref 11.6–15.2)

## 2014-03-23 LAB — ANA: Anti Nuclear Antibody(ANA): POSITIVE — AB

## 2014-03-23 LAB — HEPATITIS B CORE ANTIBODY, TOTAL: HEP B C TOTAL AB: NONREACTIVE

## 2014-03-23 LAB — HEPATITIS B SURFACE ANTIGEN: Hepatitis B Surface Ag: NEGATIVE

## 2014-03-23 LAB — HEPATITIS A ANTIBODY, TOTAL: Hep A Total Ab: NONREACTIVE

## 2014-03-23 LAB — HIV ANTIBODY (ROUTINE TESTING W REFLEX): HIV: NONREACTIVE

## 2014-03-24 LAB — HEPATITIS C RNA QUANTITATIVE
HCV QUANT LOG: 6.73 {Log} — AB (ref <1.18)
HCV Quantitative: 5322733 IU/mL — ABNORMAL HIGH (ref <15)

## 2014-03-25 LAB — HEPATITIS C GENOTYPE

## 2014-04-06 ENCOUNTER — Encounter: Payer: Self-pay | Admitting: Obstetrics & Gynecology

## 2014-04-06 ENCOUNTER — Ambulatory Visit (INDEPENDENT_AMBULATORY_CARE_PROVIDER_SITE_OTHER): Payer: Self-pay | Admitting: Obstetrics & Gynecology

## 2014-04-06 VITALS — BP 126/72 | HR 94 | Temp 97.9°F | Ht 67.0 in | Wt 131.1 lb

## 2014-04-06 DIAGNOSIS — Z0000 Encounter for general adult medical examination without abnormal findings: Secondary | ICD-10-CM

## 2014-04-06 NOTE — Progress Notes (Signed)
   Subjective:    Patient ID: [name], female    DOB: 1961/01/11, 54 y.o.   MRN: [id number]  HPI This SW 54 yo lady is here to have a breast exam and discuss her menopausal symptoms of night sweats/hot flashes. She was recently diagnosed with Hep c and will go to a clinic in [place]. Her LMP was about 5 years ago.   Review of Systems     Objective:   Physical Exam Left nipple inverted and a surgical scar just to the right of the nipple. No masses       Assessment & Plan:  Preventative- She filled out the paperwork for the mammogram scholarship She now has information about the free pap smear clinics Menopausal symptoms- I have recommended OTC treatments including soy beans, estroven, etc.

## 2014-04-11 ENCOUNTER — Other Ambulatory Visit: Payer: Self-pay | Admitting: Obstetrics and Gynecology

## 2014-04-11 DIAGNOSIS — Z1231 Encounter for screening mammogram for malignant neoplasm of breast: Secondary | ICD-10-CM

## 2014-04-13 ENCOUNTER — Other Ambulatory Visit: Payer: Self-pay | Admitting: Internal Medicine

## 2014-04-21 ENCOUNTER — Encounter: Payer: Self-pay | Admitting: Internal Medicine

## 2014-04-21 ENCOUNTER — Ambulatory Visit (INDEPENDENT_AMBULATORY_CARE_PROVIDER_SITE_OTHER): Payer: Self-pay | Admitting: Internal Medicine

## 2014-04-21 VITALS — BP 166/99 | HR 85 | Temp 97.9°F | Ht 67.0 in | Wt 129.0 lb

## 2014-04-21 DIAGNOSIS — B182 Chronic viral hepatitis C: Secondary | ICD-10-CM
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
## 2014-05-04 ENCOUNTER — Emergency Department (HOSPITAL_COMMUNITY)
Admission: EM | Admit: 2014-05-04 | Discharge: 2014-05-05 | Disposition: A | Discharge status: Home / Self Care | Payer: Self-pay | Attending: Emergency Medicine | Admitting: Emergency Medicine

## 2014-05-04 ENCOUNTER — Encounter (HOSPITAL_COMMUNITY): Payer: Self-pay

## 2014-05-04 DIAGNOSIS — F419 Anxiety disorder, unspecified: Secondary | ICD-10-CM | POA: Insufficient documentation

## 2014-05-04 DIAGNOSIS — I10 Essential (primary) hypertension: Secondary | ICD-10-CM | POA: Insufficient documentation

## 2014-05-04 DIAGNOSIS — F1210 Cannabis abuse, uncomplicated: Secondary | ICD-10-CM | POA: Insufficient documentation

## 2014-05-04 DIAGNOSIS — F332 Major depressive disorder, recurrent severe without psychotic features: Secondary | ICD-10-CM | POA: Diagnosis present

## 2014-05-04 DIAGNOSIS — R45851 Suicidal ideations: Secondary | ICD-10-CM

## 2014-05-04 DIAGNOSIS — Z590 Homelessness: Secondary | ICD-10-CM | POA: Insufficient documentation

## 2014-05-04 DIAGNOSIS — Z720 Tobacco use: Secondary | ICD-10-CM | POA: Insufficient documentation

## 2014-05-04 DIAGNOSIS — Z79899 Other long term (current) drug therapy: Secondary | ICD-10-CM | POA: Insufficient documentation

## 2014-05-04 DIAGNOSIS — F1410 Cocaine abuse, uncomplicated: Secondary | ICD-10-CM | POA: Insufficient documentation

## 2014-05-04 DIAGNOSIS — F32A Depression, unspecified: Secondary | ICD-10-CM

## 2014-05-04 DIAGNOSIS — F329 Major depressive disorder, single episode, unspecified: Secondary | ICD-10-CM | POA: Insufficient documentation

## 2014-05-04 LAB — RAPID URINE DRUG SCREEN, HOSP PERFORMED
Amphetamines: NOT DETECTED
Barbiturates: NOT DETECTED
Benzodiazepines: NOT DETECTED
COCAINE: POSITIVE — AB
Opiates: NOT DETECTED
TETRAHYDROCANNABINOL: POSITIVE — AB

## 2014-05-04 LAB — CBC
HCT: 39.7 % (ref 36.0–46.0)
HEMOGLOBIN: 13.9 g/dL (ref 12.0–15.0)
MCH: 34.5 pg — ABNORMAL HIGH (ref 26.0–34.0)
MCHC: 35 g/dL (ref 30.0–36.0)
MCV: 98.5 fL (ref 78.0–100.0)
Platelets: 255 10*3/uL (ref 150–400)
RBC: 4.03 MIL/uL (ref 3.87–5.11)
RDW: 14.3 % (ref 11.5–15.5)
WBC: 7.3 10*3/uL (ref 4.0–10.5)

## 2014-05-04 LAB — COMPREHENSIVE METABOLIC PANEL
ALK PHOS: 79 U/L (ref 39–117)
ALT: 67 U/L — AB (ref 0–35)
AST: 96 U/L — ABNORMAL HIGH (ref 0–37)
Albumin: 4.4 g/dL (ref 3.5–5.2)
Anion gap: 13 (ref 5–15)
BILIRUBIN TOTAL: 0.5 mg/dL (ref 0.3–1.2)
BUN: 9 mg/dL (ref 6–23)
CHLORIDE: 97 mmol/L (ref 96–112)
CO2: 24 mmol/L (ref 19–32)
Calcium: 9.5 mg/dL (ref 8.4–10.5)
Creatinine, Ser: 0.93 mg/dL (ref 0.50–1.10)
GFR calc non Af Amer: 69 mL/min — ABNORMAL LOW (ref >90)
GFR, EST AFRICAN AMERICAN: 80 mL/min — AB (ref >90)
GLUCOSE: 93 mg/dL (ref 70–99)
POTASSIUM: 4 mmol/L (ref 3.5–5.1)
Sodium: 134 mmol/L — ABNORMAL LOW (ref 135–145)
TOTAL PROTEIN: 8.7 g/dL — AB (ref 6.0–8.3)

## 2014-05-04 LAB — SALICYLATE LEVEL: Salicylate Lvl: <4 mg/dL (ref 2.8–20.0)

## 2014-05-04 LAB — ACETAMINOPHEN LEVEL: Acetaminophen (Tylenol), Serum: <10 ug/mL — ABNORMAL LOW (ref 10–30)

## 2014-05-04 LAB — ETHANOL: ALCOHOL ETHYL (B): 282 mg/dL — AB (ref 0–9)

## 2014-05-04 MED ORDER — HYDROCHLOROTHIAZIDE 12.5 MG PO CAPS
12.5000 mg | ORAL_CAPSULE | Freq: Every day | ORAL | Status: DC
  Administered 2014-05-04 – 2014-05-05 (×2): 12.5 mg via ORAL
  Filled 2014-05-04 (×3): qty 1

## 2014-05-04 MED ORDER — LORAZEPAM 1 MG PO TABS
0.0000 mg | ORAL_TABLET | Freq: Two times a day (BID) | ORAL | Status: DC
Start: 2014-05-06 — End: 2014-05-05

## 2014-05-04 MED ORDER — LISINOPRIL 10 MG PO TABS
10.0000 mg | ORAL_TABLET | Freq: Every day | ORAL | Status: DC
  Administered 2014-05-04 – 2014-05-05 (×2): 10 mg via ORAL
  Filled 2014-05-04 (×3): qty 1

## 2014-05-04 MED ORDER — LORAZEPAM 1 MG PO TABS
0.0000 mg | ORAL_TABLET | Freq: Four times a day (QID) | ORAL | Status: DC
  Administered 2014-05-05: 2 mg via ORAL
  Filled 2014-05-04: qty 2

## 2014-05-04 MED ORDER — LISINOPRIL-HYDROCHLOROTHIAZIDE 10-12.5 MG PO TABS: 1.0000 | ORAL_TABLET | Freq: Every day | ORAL | Status: DC

## 2014-05-04 MED ORDER — ALBUTEROL SULFATE HFA 108 (90 BASE) MCG/ACT IN AERS: 2.0000 | INHALATION_SPRAY | Freq: Four times a day (QID) | RESPIRATORY_TRACT | Status: DC | PRN

## 2014-05-04 NOTE — ED Notes (Signed)
Pt. Is unable to use the restroom at this time but is aware that we need a urine specimen.
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
## 2014-05-04 NOTE — ED Provider Notes (Signed)
CSN: [id number]     Arrival date & time 05/04/14  1745 History   First MD Initiated Contact with Patient 05/04/14 1802     Chief Complaint  Patient presents with  . Suicidal     (Consider location/radiation/quality/duration/timing/severity/associated sxs/prior Treatment) The history is provided by the patient. The history is limited by the condition of the patient.  pt with hx depression, homelessness, c/o worsening stressors in the past few weeks. States its 'everything'.  Pt states homeless, money problems and relationship problems. Today with thoughts of suicide. States tried to cut wrists, pt with several linear very superficial lacs, hx same.  +episodic etoh abuse, denies daily use. Denies hx seizures, dts, or complicated etoh withdrawal. Denies daily use. Denies other drug use. Denies any recent physical health problems. States has been without meds in past weeks, and also states she didn't feel meds helped much. No fever or chills. No headaches. No cp or sob. No abd pain. No nvd. Denies trauma or injury.     Past Medical History  Diagnosis Date  . Hypertension     pt stated "every once in a while BP will be high but has not been prescribed medication for HTN.   . Seasonal allergies   . Alcohol abuse    Past Surgical History  Procedure Laterality Date  . Fracture surgery      L arm  . Cesarean section     No family history on file. History  Substance Use Topics  . Smoking status: Current Every Day Smoker -- 1.00 packs/day for 25 years    Types: Cigarettes  . Smokeless tobacco: Not on file  . Alcohol Use: 3.0 oz/week    3 Cans of beer, 2 Glasses of wine per week   OB History    Gravida Para Term Preterm AB TAB SAB Ectopic Multiple Living   4 3 3  0 1 1 0 0 0 3     Review of Systems  Constitutional: Negative for fever and chills.  HENT: Negative for sore throat.   Eyes: Negative for pain and visual disturbance.  Respiratory: Negative for shortness of breath.    Cardiovascular: Negative for chest pain.  Gastrointestinal: Negative for vomiting, abdominal pain and diarrhea.  Endocrine: Negative for polyuria.  Genitourinary: Negative for dysuria and flank pain.  Musculoskeletal: Negative for back pain and neck pain.  Skin: Negative for rash.  Neurological: Negative for headaches.  Hematological: Does not bruise/bleed easily.  Psychiatric/Behavioral: Positive for suicidal ideas and dysphoric mood.      Allergies  Review of patient's allergies indicates no known allergies.  Home Medications   Prior to Admission medications   Medication Sig Start Date End Date Taking? Authorizing Provider  ibuprofen (ADVIL,MOTRIN) 800 MG tablet Take 800 mg by mouth every 8 (eight) hours as needed for moderate pain. 06/08/13  Yes Lorayne Marek, MD  lisinopril-hydrochlorothiazide (PRINZIDE,ZESTORETIC) 10-12.5 MG per tablet Take 1 tablet by mouth daily. 02/08/14  Yes Lorayne Marek, MD  albuterol (PROVENTIL HFA;VENTOLIN HFA) 108 (90 BASE) MCG/ACT inhaler Inhale 2 puffs into the lungs every 6 (six) hours as needed for wheezing or shortness of breath. Patient not taking: Reported on 04/21/2014 02/08/14   Lorayne Marek, MD  gabapentin (NEURONTIN) 300 MG capsule TAKE 1 CAPSULE BY MOUTH 3 TIMES DAILY. Patient not taking: Reported on 04/21/2014 04/15/14   Lorayne Marek, MD   BP 154/84 mmHg  Pulse 111  Temp(Src) 97.8 F (36.6 C) (Oral)  Resp 18  SpO2 100% Physical Exam  Constitutional: She is oriented to person, place, and time. She appears well-developed and well-nourished. No distress.  HENT:  Head: Atraumatic.  Mouth/Throat: Oropharynx is clear and moist.  Eyes: Conjunctivae are normal. Pupils are equal, round, and reactive to light. No scleral icterus.  Neck: Neck supple. No tracheal deviation present. No thyromegaly present.  Cardiovascular: Normal rate, regular rhythm, normal heart sounds and intact distal pulses.   Pulmonary/Chest: Effort normal and breath sounds  normal. No respiratory distress. She exhibits no tenderness.  Abdominal: Soft. Normal appearance and bowel sounds are normal. She exhibits no distension. There is no tenderness.  Musculoskeletal: She exhibits no edema or tenderness.  Neurological: She is alert and oriented to person, place, and time.  Steady gait, no tremor or shakes.   Skin: Skin is warm and dry. No rash noted. She is not diaphoretic.  Psychiatric:  Anxious and tearful. +SI.    Nursing note and vitals reviewed.   ED Course  Procedures (including critical care time) Labs Review  Results for orders placed or performed during the hospital encounter of 05/04/14  Comprehensive metabolic panel  Result Value Ref Range   Sodium 134 (L) 135 - 145 mmol/L   Potassium 4.0 3.5 - 5.1 mmol/L   Chloride 97 96 - 112 mmol/L   CO2 24 19 - 32 mmol/L   Glucose, Bld 93 70 - 99 mg/dL   BUN 9 6 - 23 mg/dL   Creatinine, Ser 0.93 0.50 - 1.10 mg/dL   Calcium 9.5 8.4 - 10.5 mg/dL   Total Protein 8.7 (H) 6.0 - 8.3 g/dL   Albumin 4.4 3.5 - 5.2 g/dL   AST 96 (H) 0 - 37 U/L   ALT 67 (H) 0 - 35 U/L   Alkaline Phosphatase 79 39 - 117 U/L   Total Bilirubin 0.5 0.3 - 1.2 mg/dL   GFR calc non Af Amer 69 (L) >90 mL/min   GFR calc Af Amer 80 (L) >90 mL/min   Anion gap 13 5 - 15  CBC  Result Value Ref Range   WBC 7.3 4.0 - 10.5 K/uL   RBC 4.03 3.87 - 5.11 MIL/uL   Hemoglobin 13.9 12.0 - 15.0 g/dL   HCT 39.7 36.0 - 46.0 %   MCV 98.5 78.0 - 100.0 fL   MCH 34.5 (H) 26.0 - 34.0 pg   MCHC 35.0 30.0 - 36.0 g/dL   RDW 14.3 11.5 - 15.5 %   Platelets 255 150 - 400 K/uL  Ethanol  Result Value Ref Range   Alcohol, Ethyl (B) 282 (H) 0 - 9 mg/dL  Acetaminophen level  Result Value Ref Range   Acetaminophen (Tylenol), Serum <10.0 (L) 10 - 30 ug/mL  Salicylate level  Result Value Ref Range   Salicylate Lvl 123456 2.8 - 20.0 mg/dL  Urine rapid drug screen (hosp performed)  Result Value Ref Range   Opiates NONE DETECTED NONE DETECTED   Cocaine  POSITIVE (A) NONE DETECTED   Benzodiazepines NONE DETECTED NONE DETECTED   Amphetamines NONE DETECTED NONE DETECTED   Tetrahydrocannabinol POSITIVE (A) NONE DETECTED   Barbiturates NONE DETECTED NONE DETECTED       MDM   Labs.  Psych team consulted.  Temp psych holding orders placed.  dispo per psych team.     Mirna Mires, MD 05/07/14 [phone]

## 2014-05-05 DIAGNOSIS — R45851 Suicidal ideations: Secondary | ICD-10-CM | POA: Insufficient documentation

## 2014-05-05 DIAGNOSIS — F332 Major depressive disorder, recurrent severe without psychotic features: Secondary | ICD-10-CM | POA: Diagnosis present

## 2014-05-05 MED ORDER — ZOLPIDEM TARTRATE 5 MG PO TABS: 5.0000 mg | ORAL_TABLET | Freq: Every evening | ORAL | Status: DC | PRN

## 2014-05-05 MED ORDER — ACETAMINOPHEN 325 MG PO TABS: 650.0000 mg | ORAL_TABLET | ORAL | Status: DC | PRN

## 2014-05-05 MED ORDER — LORAZEPAM 2 MG PO TABS: 0.0000 mg | ORAL_TABLET | Freq: Four times a day (QID) | ORAL | Status: DC

## 2014-05-05 MED ORDER — ONDANSETRON HCL 4 MG PO TABS: 4.0000 mg | ORAL_TABLET | Freq: Three times a day (TID) | ORAL | Status: DC | PRN

## 2014-05-05 MED ORDER — ALUM & MAG HYDROXIDE-SIMETH 200-200-20 MG/5ML PO SUSP: 30.0000 mL | ORAL | Status: DC | PRN

## 2014-05-05 MED ORDER — IBUPROFEN 200 MG PO TABS
600.0000 mg | ORAL_TABLET | Freq: Three times a day (TID) | ORAL | Status: DC | PRN
  Administered 2014-05-05: 600 mg via ORAL
  Filled 2014-05-05: qty 3
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
## 2014-05-05 NOTE — BH Assessment (Signed)
Inpt recommended, no appropriate [facility] bed available. TTS seeking placeent: [place], [place] Triage Specialist 05/05/2014 1:34 AM
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
## 2014-05-05 NOTE — BHH Suicide Risk Assessment (Cosign Needed)
Suicide Risk Assessment  Discharge Assessment   [facility] Discharge Suicide Risk Assessment   Demographic Factors:  Caucasian, Low socioeconomic status, Living alone and Unemployed  Total Time spent with patient: 20 minutes  Musculoskeletal: Strength & Muscle Tone: within normal limits Gait & Station: normal Patient leans: N/A  Psychiatric Specialty Exam:     Blood pressure 122/70, pulse 92, temperature 99.3 F (37.4 C), temperature source Oral, resp. rate 18, SpO2 98 %.There is no weight on file to calculate BMI.  General Appearance: Casual and Disheveled  Eye Contact::  Good  Speech:  Clear and Coherent and Normal Rate409  Volume:  Normal  Mood:  Depressed  Affect:  Congruent and Depressed  Thought Process:  Coherent, Goal Directed and Intact  Orientation:  Full (Time, Place, and Person)  Thought Content:  WDL  Suicidal Thoughts:  No  Homicidal Thoughts:  No  Memory:  Immediate;   Good Recent;   Good Remote;   Good  Judgement:  Poor  Insight:  Fair  Psychomotor Activity:  Normal  Concentration:  Good  Recall:  NA  Fund of Knowledge:Fair  Language: Good  Akathisia:  NA  Handed:  Right  AIMS (if indicated):     Assets:  Desire for Improvement  Sleep:     Cognition: WNL  ADL's:  Impaired      Has this patient used any form of tobacco in the last 30 days? (Cigarettes, Smokeless Tobacco, Cigars, and/or Pipes) N/A  Mental Status Per Nursing Assessment::   On Admission:     Current Mental Status by Physician: NA  Loss Factors: Loss of significant relationship  Historical Factors: NA  Risk Reduction Factors:   NA and moving in to [facility]  Continued Clinical Symptoms:  Alcohol/Substance Abuse/Dependencies  Cognitive Features That Contribute To Risk:  Closed-mindedness and Polarized thinking    Suicide Risk:  Minimal: No identifiable suicidal ideation.  Patients presenting with no risk factors but with morbid ruminations; may be classified as  minimal risk based on the severity of the depressive symptoms  Principal Problem: Major depressive disorder, recurrent severe without psychotic features Discharge Diagnoses:  Patient Active Problem List   Diagnosis Date Noted  . Major depressive disorder, recurrent severe without psychotic features [F33.2] 05/05/2014    Priority: High  . Chronic leg pain [M79.606, G89.29] 06/08/2013  . Smoking [Z72.0] 06/08/2013  . Joint pain [M25.50] 06/08/2013  . Leg pain, inferior [M79.606] 12/18/2012  . Unspecified essential hypertension [I10] 12/18/2012  . Elevated LFTs [R79.89] 10/28/2012  . Wound infection [T79.8XXA] 10/28/2012  . Homelessness [Z59.0] 10/28/2012  . Macrocytosis without anemia [D75.89] 10/28/2012  . Chronic hepatitis C without hepatic coma [B18.2] 10/28/2012  . Hypertension [I10]     Follow-up Information    Follow up with Lorayne Marek, MD.   Specialty:  Internal Medicine   Contact information:   [place] [address] [phone]       Follow up with [facility] self pay, uninsured resources .   Why:  As needed   Contact information:   review and follow up on written resources provided to you       Plan Of Care/Follow-up recommendations:  Activity:  as tolerated Diet:  regular  Is patient on multiple antipsychotic therapies at discharge:  No   Has Patient had three or more failed trials of antipsychotic monotherapy by history:  No  Recommended Plan for Multiple Antipsychotic Therapies: NA    Brayam Boeke, C  PMHNP-BC 05/05/2014, 12:32 PM

## 2014-05-05 NOTE — BH Assessment (Signed)
Pt accepted to [facility] by Dr Mina Marble. Pt to be admitted to [address] [place], pls call report to [phone]. Informed RN and Dr. Sharol Given.   Lear Ng, [facility] Triage Specialist 05/05/2014 3:36 AM
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
## 2014-05-20 ENCOUNTER — Encounter (HOSPITAL_COMMUNITY): Payer: Self-pay | Admitting: *Deleted

## 2014-05-20 ENCOUNTER — Emergency Department (HOSPITAL_COMMUNITY)
Admission: EM | Admit: 2014-05-20 | Discharge: 2014-05-20 | Discharge status: Against Medical Advice | Payer: Self-pay | Attending: Emergency Medicine | Admitting: Emergency Medicine

## 2014-05-20 DIAGNOSIS — F10129 Alcohol abuse with intoxication, unspecified: Secondary | ICD-10-CM | POA: Insufficient documentation

## 2014-05-20 DIAGNOSIS — Z720 Tobacco use: Secondary | ICD-10-CM | POA: Insufficient documentation

## 2014-05-20 DIAGNOSIS — Y999 Unspecified external cause status: Secondary | ICD-10-CM | POA: Insufficient documentation

## 2014-05-20 DIAGNOSIS — Y939 Activity, unspecified: Secondary | ICD-10-CM | POA: Insufficient documentation

## 2014-05-20 DIAGNOSIS — Y929 Unspecified place or not applicable: Secondary | ICD-10-CM | POA: Insufficient documentation

## 2014-05-20 DIAGNOSIS — S51812A Laceration without foreign body of left forearm, initial encounter: Secondary | ICD-10-CM | POA: Insufficient documentation

## 2014-05-20 DIAGNOSIS — Y289XXA Contact with unspecified sharp object, undetermined intent, initial encounter: Secondary | ICD-10-CM | POA: Insufficient documentation

## 2014-05-20 DIAGNOSIS — I10 Essential (primary) hypertension: Secondary | ICD-10-CM | POA: Insufficient documentation
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
## 2014-05-20 NOTE — ED Notes (Signed)
Patient continues to be beligerant and argumentative to/with staff. Security called to bedside d/t patient loudly yelling at staff.

## 2014-05-20 NOTE — ED Notes (Signed)
Security called to bedside due to patient yelling "what if I blow everyone's fucking head off?" Pt asked to calm down and proceeds to answer phone. Unable to triage appropriately.

## 2014-05-20 NOTE — ED Notes (Signed)
Patient escorted off of premises due to behavior toward staff.
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
## 2014-06-06 ENCOUNTER — Ambulatory Visit: Payer: Self-pay | Admitting: Internal Medicine

## 2014-08-09 ENCOUNTER — Encounter: Payer: Self-pay | Admitting: Internal Medicine

## 2014-08-09 ENCOUNTER — Ambulatory Visit: Payer: Self-pay | Attending: Internal Medicine | Admitting: Internal Medicine

## 2014-08-09 ENCOUNTER — Ambulatory Visit: Payer: Self-pay

## 2014-08-09 VITALS — BP 129/97 | HR 109 | Temp 98.2°F | Resp 16 | Ht 67.0 in | Wt 128.6 lb

## 2014-08-09 DIAGNOSIS — F1010 Alcohol abuse, uncomplicated: Secondary | ICD-10-CM

## 2014-08-09 DIAGNOSIS — R062 Wheezing: Secondary | ICD-10-CM

## 2014-08-09 DIAGNOSIS — IMO0001 Reserved for inherently not codable concepts without codable children: Secondary | ICD-10-CM

## 2014-08-09 DIAGNOSIS — Z720 Tobacco use: Secondary | ICD-10-CM

## 2014-08-09 DIAGNOSIS — F17200 Nicotine dependence, unspecified, uncomplicated: Secondary | ICD-10-CM

## 2014-08-09 DIAGNOSIS — M7918 Myalgia, other site: Secondary | ICD-10-CM

## 2014-08-09 DIAGNOSIS — Z8619 Personal history of other infectious and parasitic diseases: Secondary | ICD-10-CM

## 2014-08-09 DIAGNOSIS — I10 Essential (primary) hypertension: Secondary | ICD-10-CM

## 2014-08-09 DIAGNOSIS — M2550 Pain in unspecified joint: Secondary | ICD-10-CM | POA: Insufficient documentation

## 2014-08-09 DIAGNOSIS — B182 Chronic viral hepatitis C: Secondary | ICD-10-CM | POA: Insufficient documentation

## 2014-08-09 DIAGNOSIS — F17210 Nicotine dependence, cigarettes, uncomplicated: Secondary | ICD-10-CM | POA: Insufficient documentation

## 2014-08-09 DIAGNOSIS — G8929 Other chronic pain: Secondary | ICD-10-CM | POA: Insufficient documentation

## 2014-08-09 DIAGNOSIS — M791 Myalgia: Secondary | ICD-10-CM

## 2014-08-09 MED ORDER — ALBUTEROL SULFATE HFA 108 (90 BASE) MCG/ACT IN AERS: 2.0000 | INHALATION_SPRAY | Freq: Four times a day (QID) | RESPIRATORY_TRACT | Status: DC | PRN

## 2014-08-09 MED ORDER — GABAPENTIN 300 MG PO CAPS: ORAL_CAPSULE | ORAL | Status: DC

## 2014-08-09 MED ORDER — LISINOPRIL-HYDROCHLOROTHIAZIDE 10-12.5 MG PO TABS: 1.0000 | ORAL_TABLET | Freq: Every day | ORAL | Status: DC
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
## 2014-08-09 NOTE — Progress Notes (Signed)
MRN: [id number] Name: [name]  Sex: female Age: 54 y.o. DOB: [birth year]  Allergies: Review of patient's allergies indicates no known allergies.  Chief Complaint  Patient presents with  . Hip Pain    HPI: Patient is 54 y.o. female who has history of hypertension, chronic hep C, alcohol abuse, tobacco abuse, chronic joint pain, patient comes today requesting refill on her medications which she ran out for the last few weeks today her blood pressure is borderline elevated, denies any headache dizziness chest and shortness of breath, she is to drink alcohol, as per patient she is in the process to get her orange card and then she'll be able to followup with rehabilitation, history of depression, she denies any SI or HI, as per patient her symptoms are worse because she has not taken her Neurontin.patient is to smoke cigarettes, I have counseled patient to quit smoking. She use albuterol when necessary for wheezing.  Past Medical History  Diagnosis Date  . Hypertension     pt stated "every once in a while BP will be high but has not been prescribed medication for HTN.   . Seasonal allergies   . Alcohol abuse     Past Surgical History  Procedure Laterality Date  . Fracture surgery      L arm  . Cesarean section        Medication List       This list is accurate as of: 08/09/14  3:42 PM.  Always use your most recent med list.               albuterol 108 (90 BASE) MCG/ACT inhaler  Commonly known as:  PROVENTIL HFA;VENTOLIN HFA  Inhale 2 puffs into the lungs every 6 (six) hours as needed for wheezing or shortness of breath.     gabapentin 300 MG capsule  Commonly known as:  NEURONTIN  TAKE 1 CAPSULE BY MOUTH 3 TIMES DAILY.     ibuprofen 800 MG tablet  Commonly known as:  ADVIL,MOTRIN  Take 800 mg by mouth every 8 (eight) hours as needed for moderate pain.     lisinopril-hydrochlorothiazide 10-12.5 MG per tablet  Commonly known as:  PRINZIDE,ZESTORETIC  Take 1  tablet by mouth daily.        Meds ordered this encounter  Medications  . lisinopril-hydrochlorothiazide (PRINZIDE,ZESTORETIC) 10-12.5 MG per tablet    Sig: Take 1 tablet by mouth daily.    Dispense:  90 tablet    Refill:  3  . gabapentin (NEURONTIN) 300 MG capsule    Sig: TAKE 1 CAPSULE BY MOUTH 3 TIMES DAILY.    Dispense:  90 capsule    Refill:  3  . albuterol (PROVENTIL HFA;VENTOLIN HFA) 108 (90 BASE) MCG/ACT inhaler    Sig: Inhale 2 puffs into the lungs every 6 (six) hours as needed for wheezing or shortness of breath.    Dispense:  18 g    Refill:  3    Immunization History  Administered Date(s) Administered  . Tdap 09/16/2012    History reviewed. No pertinent family history.  History  Substance Use Topics  . Smoking status: Current Every Day Smoker -- 1.00 packs/day for 25 years    Types: Cigarettes  . Smokeless tobacco: Not on file  . Alcohol Use: 3.0 oz/week    2 Glasses of wine, 3 Cans of beer per week    Review of Systems   As noted in HPI  Filed Vitals:   08/09/14 1435  BP: 129/97  Pulse: 109  Temp: 98.2 F (36.8 C)  Resp: 16    Physical Exam  Physical Exam  Constitutional: No distress.  Eyes: EOM are normal. Pupils are equal, round, and reactive to light.  Cardiovascular: Normal rate and regular rhythm.   Pulmonary/Chest: Breath sounds normal. No respiratory distress. She has no wheezes. She has no rales.    CBC    Component Value Date/Time   WBC 7.3 05/04/2014 1814   RBC 4.03 05/04/2014 1814   HGB 13.9 05/04/2014 1814   HCT 39.7 05/04/2014 1814   PLT 255 05/04/2014 1814   MCV 98.5 05/04/2014 1814   LYMPHSABS 2.0 03/22/2014 1513   MONOABS 0.6 03/22/2014 1513   EOSABS 0.1 03/22/2014 1513   BASOSABS 0.1 03/22/2014 1513    CMP     Component Value Date/Time   NA 134* 05/04/2014 1814   K 4.0 05/04/2014 1814   CL 97 05/04/2014 1814   CO2 24 05/04/2014 1814   GLUCOSE 93 05/04/2014 1814   BUN 9 05/04/2014 1814   CREATININE 0.93  05/04/2014 1814   CREATININE 1.27* 02/08/2014 1301   CALCIUM 9.5 05/04/2014 1814   PROT 8.7* 05/04/2014 1814   ALBUMIN 4.4 05/04/2014 1814   AST 96* 05/04/2014 1814   ALT 67* 05/04/2014 1814   ALKPHOS 79 05/04/2014 1814   BILITOT 0.5 05/04/2014 1814   GFRNONAA 69* 05/04/2014 1814   GFRNONAA 48* 02/08/2014 1301   GFRAA 80* 05/04/2014 1814   GFRAA 56* 02/08/2014 1301    No results found for: CHOL  No results found for: HGBA1C  Lab Results  Component Value Date/Time   AST 96* 05/04/2014 06:14 PM    Assessment and Plan  Essential hypertension - Plan: advised patient for DASH diet, resume back on lisinopril-hydrochlorothiazide (PRINZIDE,ZESTORETIC) 10-12.5 MG per tablet  Smoking Again counseled patient to quit smoking  Musculoskeletal pain - Plan: Resume back on gabapentin (NEURONTIN) 300 MG capsule  Alcohol abuse Patient is going to try to cut down on drinking . History of hepatitis C Following up with ID  Wheezing - Plan: albuterol (PROVENTIL HFA;VENTOLIN HFA) 108 (90 BASE) MCG/ACT inhaler  Fasting blood work on the following visit.   Return in about 3 months (around 11/09/2014), or if symptoms worsen or fail to improve.   This note has been created with Surveyor, quantity. Any transcriptional errors are unintentional.    Lorayne Marek, MD

## 2014-10-03 ENCOUNTER — Encounter (HOSPITAL_COMMUNITY): Payer: Self-pay | Admitting: Emergency Medicine

## 2014-10-03 ENCOUNTER — Emergency Department (HOSPITAL_COMMUNITY)
Admission: EM | Admit: 2014-10-03 | Discharge: 2014-10-04 | Discharge status: Against Medical Advice | Payer: Self-pay | Attending: Emergency Medicine | Admitting: Emergency Medicine

## 2014-10-03 DIAGNOSIS — Z720 Tobacco use: Secondary | ICD-10-CM | POA: Insufficient documentation

## 2014-10-03 DIAGNOSIS — Y998 Other external cause status: Secondary | ICD-10-CM | POA: Insufficient documentation

## 2014-10-03 DIAGNOSIS — I10 Essential (primary) hypertension: Secondary | ICD-10-CM | POA: Insufficient documentation

## 2014-10-03 DIAGNOSIS — Y9389 Activity, other specified: Secondary | ICD-10-CM | POA: Insufficient documentation

## 2014-10-03 DIAGNOSIS — Y9289 Other specified places as the place of occurrence of the external cause: Secondary | ICD-10-CM | POA: Insufficient documentation

## 2014-10-03 DIAGNOSIS — T148 Other injury of unspecified body region: Secondary | ICD-10-CM | POA: Insufficient documentation

## 2014-10-03 HISTORY — DX: Homelessness: Z59.0

## 2014-10-03 HISTORY — DX: Homelessness unspecified: Z59.00

## 2014-10-03 HISTORY — DX: Cocaine abuse, uncomplicated: F14.10

## 2014-10-03 HISTORY — DX: Cannabis abuse, uncomplicated: F12.10

## 2014-10-03 HISTORY — DX: Other psychoactive substance dependence, uncomplicated: F19.20
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
## 2014-10-14 ENCOUNTER — Ambulatory Visit: Payer: Self-pay | Admitting: Internal Medicine

## 2014-11-03 ENCOUNTER — Telehealth: Payer: Self-pay
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
## 2014-11-11 ENCOUNTER — Ambulatory Visit: Payer: Self-pay | Admitting: Family Medicine

## 2014-11-22 ENCOUNTER — Ambulatory Visit: Payer: No Typology Code available for payment source | Attending: Family Medicine | Admitting: Family Medicine

## 2014-11-22 ENCOUNTER — Encounter: Payer: Self-pay | Admitting: Family Medicine

## 2014-11-22 VITALS — BP 97/61 | HR 99 | Temp 98.7°F | Resp 16 | Ht 67.0 in | Wt 117.0 lb

## 2014-11-22 DIAGNOSIS — G8929 Other chronic pain: Secondary | ICD-10-CM | POA: Insufficient documentation

## 2014-11-22 DIAGNOSIS — M7918 Myalgia, other site: Secondary | ICD-10-CM

## 2014-11-22 DIAGNOSIS — F4321 Adjustment disorder with depressed mood: Secondary | ICD-10-CM | POA: Insufficient documentation

## 2014-11-22 DIAGNOSIS — M791 Myalgia: Secondary | ICD-10-CM

## 2014-11-22 MED ORDER — IBUPROFEN 800 MG PO TABS: 800.0000 mg | ORAL_TABLET | Freq: Three times a day (TID) | ORAL | Status: DC | PRN

## 2014-11-22 MED ORDER — GABAPENTIN 300 MG PO CAPS: 600.0000 mg | ORAL_CAPSULE | Freq: Three times a day (TID) | ORAL | Status: DC

## 2014-11-22 MED ORDER — VITAMIN D (ERGOCALCIFEROL) 1.25 MG (50000 UNIT) PO CAPS: 50000.0000 [IU] | ORAL_CAPSULE | ORAL | Status: DC

## 2014-11-22 MED ORDER — TRAMADOL HCL 50 MG PO TABS: 50.0000 mg | ORAL_TABLET | Freq: Two times a day (BID) | ORAL | Status: DC | PRN

## 2014-11-22 MED ORDER — TRAZODONE HCL 50 MG PO TABS: 25.0000 mg | ORAL_TABLET | Freq: Every evening | ORAL | Status: DC | PRN

## 2014-11-22 NOTE — Assessment & Plan Note (Signed)
  Musculoskeletal pain -     gabapentin (NEURONTIN) 300 MG capsule; Take 2 capsules (600 mg total) by mouth 3 (three) times daily. TAKE 1 CAPSULE BY MOUTH 3 TIMES DAILY. -     Vitamin D, Ergocalciferol, (DRISDOL) 50000 UNITS CAPS capsule; Take 1 capsule (50,000 Units total) by mouth every 7 (seven) days. For 8 weeks -     traMADol (ULTRAM) 50 MG tablet; Take 1 tablet (50 mg total) by mouth every 12 (twelve) hours as needed. -     ibuprofen (ADVIL,MOTRIN) 800 MG tablet; Take 1 tablet (800 mg total) by mouth every 8 (eight) hours as needed for moderate pain.

## 2014-11-22 NOTE — Assessment & Plan Note (Signed)
Situational depression -     traZODone (DESYREL) 50 MG tablet; Take 0.5-1 tablets (25-50 mg total) by mouth at bedtime as needed for sleep.   I recommend counseling service for substance use (marijuana and alcohol) look into [place], [facility] or [place]

## 2014-11-22 NOTE — Patient Instructions (Signed)
Ms. Jesilyn, [place] was seen today for establish care and leg pain.  Diagnoses and all orders for this visit:  Musculoskeletal pain -     gabapentin (NEURONTIN) 300 MG capsule; Take 2 capsules (600 mg total) by mouth 3 (three) times daily. TAKE 1 CAPSULE BY MOUTH 3 TIMES DAILY. -     Vitamin D, Ergocalciferol, (DRISDOL) 50000 UNITS CAPS capsule; Take 1 capsule (50,000 Units total) by mouth every 7 (seven) days. For 8 weeks -     traMADol (ULTRAM) 50 MG tablet; Take 1 tablet (50 mg total) by mouth every 12 (twelve) hours as needed. -     ibuprofen (ADVIL,MOTRIN) 800 MG tablet; Take 1 tablet (800 mg total) by mouth every 8 (eight) hours as needed for moderate pain.  Situational depression -     traZODone (DESYREL) 50 MG tablet; Take 0.5-1 tablets (25-50 mg total) by mouth at bedtime as needed for sleep.   I recommend counseling service for substance use (marijuana and alcohol) look into [place], [facility] [place] or [place]   F/u in 6 weeks for depressed mood and trouble sleeping  Dr. Adrian Blackwater
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
## 2014-11-23 LAB — VITAMIN D 25 HYDROXY (VIT D DEFICIENCY, FRACTURES): Vit D, 25-Hydroxy: 30 ng/mL (ref 30–100)

## 2014-11-29 ENCOUNTER — Ambulatory Visit: Payer: Self-pay

## 2014-11-30 ENCOUNTER — Telehealth: Payer: Self-pay | Admitting: *Deleted

## 2014-11-30 NOTE — Telephone Encounter (Signed)
-----   Message from Boykin Nearing, MD sent at 11/23/2014  8:48 AM EDT ----- Vit D normal at 30  Recommend supplement with 2000 IU daily to keep D in normal range

## 2014-11-30 NOTE — Telephone Encounter (Signed)
Unable to contact pt Female stated She is not here and hung up

## 2014-12-01 ENCOUNTER — Ambulatory Visit: Payer: Self-pay | Admitting: Internal Medicine

## 2014-12-14 ENCOUNTER — Ambulatory Visit: Payer: Self-pay

## 2014-12-20 ENCOUNTER — Ambulatory Visit: Payer: Self-pay | Admitting: *Deleted

## 2014-12-22 ENCOUNTER — Ambulatory Visit: Payer: Self-pay | Attending: Family Medicine | Admitting: Physician Assistant

## 2014-12-22 VITALS — BP 117/69 | HR 86 | Temp 98.3°F | Resp 18 | Ht 67.0 in | Wt 121.0 lb

## 2014-12-22 DIAGNOSIS — I10 Essential (primary) hypertension: Secondary | ICD-10-CM | POA: Insufficient documentation

## 2014-12-22 DIAGNOSIS — Z79899 Other long term (current) drug therapy: Secondary | ICD-10-CM | POA: Insufficient documentation

## 2014-12-22 DIAGNOSIS — F1910 Other psychoactive substance abuse, uncomplicated: Secondary | ICD-10-CM | POA: Insufficient documentation

## 2014-12-22 DIAGNOSIS — B182 Chronic viral hepatitis C: Secondary | ICD-10-CM | POA: Insufficient documentation

## 2014-12-22 DIAGNOSIS — R1013 Epigastric pain: Secondary | ICD-10-CM | POA: Insufficient documentation

## 2014-12-22 DIAGNOSIS — M7918 Myalgia, other site: Secondary | ICD-10-CM

## 2014-12-22 DIAGNOSIS — G894 Chronic pain syndrome: Secondary | ICD-10-CM | POA: Insufficient documentation

## 2014-12-22 DIAGNOSIS — F17200 Nicotine dependence, unspecified, uncomplicated: Secondary | ICD-10-CM | POA: Insufficient documentation

## 2014-12-22 DIAGNOSIS — Z8673 Personal history of transient ischemic attack (TIA), and cerebral infarction without residual deficits: Secondary | ICD-10-CM | POA: Insufficient documentation

## 2014-12-22 DIAGNOSIS — M791 Myalgia: Secondary | ICD-10-CM

## 2014-12-22 LAB — CBC WITH DIFFERENTIAL/PLATELET
BASOS ABS: 0.1 10*3/uL (ref 0.0–0.1)
BASOS PCT: 1 % (ref 0–1)
EOS PCT: 3 % (ref 0–5)
Eosinophils Absolute: 0.2 10*3/uL (ref 0.0–0.7)
HCT: 31.8 % — ABNORMAL LOW (ref 36.0–46.0)
Hemoglobin: 11.1 g/dL — ABNORMAL LOW (ref 12.0–15.0)
LYMPHS PCT: 24 % (ref 12–46)
Lymphs Abs: 1.2 10*3/uL (ref 0.7–4.0)
MCH: 33.6 pg (ref 26.0–34.0)
MCHC: 34.9 g/dL (ref 30.0–36.0)
MCV: 96.4 fL (ref 78.0–100.0)
MONO ABS: 0.7 10*3/uL (ref 0.1–1.0)
MPV: 9.7 fL (ref 8.6–12.4)
Monocytes Relative: 14 % — ABNORMAL HIGH (ref 3–12)
Neutro Abs: 3 10*3/uL (ref 1.7–7.7)
Neutrophils Relative %: 58 % (ref 43–77)
PLATELETS: 361 10*3/uL (ref 150–400)
RBC: 3.3 MIL/uL — AB (ref 3.87–5.11)
RDW: 14.1 % (ref 11.5–15.5)
WBC: 5.2 10*3/uL (ref 4.0–10.5)

## 2014-12-22 LAB — LIPASE: LIPASE: 43 U/L (ref 7–60)

## 2014-12-22 LAB — BASIC METABOLIC PANEL
BUN: 14 mg/dL (ref 7–25)
CO2: 25 mmol/L (ref 20–31)
Calcium: 9.5 mg/dL (ref 8.6–10.4)
Chloride: 96 mmol/L — ABNORMAL LOW (ref 98–110)
Creat: 1.02 mg/dL (ref 0.50–1.05)
Glucose, Bld: 89 mg/dL (ref 65–99)
POTASSIUM: 4.1 mmol/L (ref 3.5–5.3)
SODIUM: 132 mmol/L — AB (ref 135–146)

## 2014-12-22 LAB — AMYLASE: AMYLASE: 130 U/L — AB (ref 0–105)

## 2014-12-22 MED ORDER — TRAMADOL HCL 50 MG PO TABS: 50.0000 mg | ORAL_TABLET | Freq: Two times a day (BID) | ORAL | Status: DC | PRN

## 2014-12-22 MED ORDER — OMEPRAZOLE 40 MG PO CPDR: 40.0000 mg | DELAYED_RELEASE_CAPSULE | Freq: Every day | ORAL | Status: DC

## 2014-12-22 NOTE — Progress Notes (Signed)
Chief Complaint: Back and stomach pain  Subjective: -year-old female with chronic Knee. She also has polysubstance abuse history and hypertension. She is a smoker. She is presenting with 2 days of pain in her belly. She locates to the epigastric area and sometimes his through to her back. Initially she thought it was indigestion. He became more constant over the last 24 hours. She is tried Pepto-Bismol and Alka-Seltzer with temporary relief. She's had normal bowel movements. She has not had any nausea or vomiting. She has not had any fever or chills.  She recently did some strenuous activity in regards to moving from one location to another. She wonders if she has pulled a muscle.  She also admits that her eating habits are very erratic.   ROS:  GEN: denies fever or chills, denies change in weight LUNGS: denies SHOB, dyspnea, PND, orthopnea CV: denies CP or palpitations ABD: + abd pain, N or V EXT: denies muscle spasms or swelling; no pain in lower ext, no weakness   Objective:  Filed Vitals:   12/22/14 1026  BP: 117/69  Pulse: 86  Temp: 98.3 F (36.8 C)  TempSrc: Oral  Resp: 18  Height: 5\' 7"  (1.702 m)  Weight: 121 lb (54.885 kg)  SpO2: 100%    Physical Exam:  General: in no acute distress. Abdomen: Soft,  Diffusely tender, positive bowel sounds. No OM. No rebound. Extremities: No clubbing cyanosis or edema with positive pedal pulses. Neuro: Alert, awake, oriented x3, nonfocal.   Medications: Prior to Admission medications   Medication Sig Start Date End Date Taking? Authorizing Provider  albuterol (PROVENTIL HFA;VENTOLIN HFA) 108 (90 BASE) MCG/ACT inhaler Inhale 2 puffs into the lungs every 6 (six) hours as needed for wheezing or shortness of breath. 08/09/14  Yes Deepak Advani, MD  gabapentin (NEURONTIN) 300 MG capsule Take 2 capsules (600 mg total) by mouth 3 (three) times daily. TAKE 1 CAPSULE BY MOUTH 3 TIMES DAILY. 11/22/14  Yes Josalyn Funches, MD  ibuprofen  (ADVIL,MOTRIN) 800 MG tablet Take 1 tablet (800 mg total) by mouth every 8 (eight) hours as needed for moderate pain. 11/22/14  Yes Josalyn Funches, MD  lisinopril-hydrochlorothiazide (PRINZIDE,ZESTORETIC) 10-12.5 MG per tablet Take 1 tablet by mouth daily. 08/09/14  Yes Lorayne Marek, MD  traZODone (DESYREL) 50 MG tablet Take 0.5-1 tablets (25-50 mg total) by mouth at bedtime as needed for sleep. 11/22/14  Yes Josalyn Funches, MD  omeprazole (PRILOSEC) 40 MG capsule Take 1 capsule (40 mg total) by mouth daily. 12/22/14   Aryianna Earwood Daneil Dan, PA-C  traMADol (ULTRAM) 50 MG tablet Take 1 tablet (50 mg total) by mouth every 12 (twelve) hours as needed. 12/22/14   Brayton Caves, PA-C  Vitamin D, Ergocalciferol, (DRISDOL) 50000 UNITS CAPS capsule Take 1 capsule (50,000 Units total) by mouth every 7 (seven) days. For 8 weeks Patient not taking: Reported on 12/22/2014 11/22/14   Boykin Nearing, MD    Assessment: 1. Abdominal pain, epigastric ?etiology 2. 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 Polysubstance Abuse 4.Chronic Hep C  Plan: Check CBC, BMP, Amylase and Lipase Refilled Tramadol/IBuprofren Add Omeprazole Explained that the other meds that she is requesting is available at our pharmacy, she needs to request the refill Encouraged to eat more regular and balanced meals Encouraged to keep appt with primary Gastroenterologist in Dec  Follow up:1 week  The patient was given clear instructions to go to ER or return to medical center if symptoms don't improve, worsen or new problems develop. The  patient verbalized understanding. The patient was told to call to get lab results if they haven't heard anything in the next week.   This note has been created with Surveyor, quantity. Any transcriptional errors are unintentional.   Zettie Pho, PA-C 12/22/2014, 11:07 AM

## 2014-12-22 NOTE — Progress Notes (Signed)
Patient has had abdominal pain, wraps around to back. Vomited once when she tried to eat. Patient denies fever and chills. Patient just wants to ball up in ball. Patient has had indigestion also. Abdominal pain, at level " On my God its like a 12", described as aching, cramping.   Patient reports "a [place] time ago they said something about a hiatal hernia"  Patient cut right thumb while moving.   Patient ready to quit smoking.  Patient refuses to fill out PHQ 9

## 2014-12-30 ENCOUNTER — Ambulatory Visit: Payer: Self-pay | Admitting: Family Medicine

## 2015-01-04 ENCOUNTER — Ambulatory Visit: Payer: Self-pay | Attending: Internal Medicine

## 2015-01-12 ENCOUNTER — Ambulatory Visit: Payer: Self-pay | Admitting: Family Medicine

## 2015-01-19 ENCOUNTER — Ambulatory Visit: Payer: No Typology Code available for payment source | Admitting: *Deleted

## 2015-01-19 DIAGNOSIS — F1010 Alcohol abuse, uncomplicated: Secondary | ICD-10-CM

## 2015-01-19 NOTE — BH Specialist Note (Signed)
Cadence was present for her scheduled appointment today.  Client was oriented times four but restless affect. Client was semi alert and closed her eyes a few times during session.  Client appeared to be having some withdrawal symptoms such as: confusion, hand tremors, high blood pressure, irritability, and anxiety.  Client indicated that she was fine but clearly she was not.  Client was resistant to counseling but warmed up slightly by end of appointment.  Client communicated that she drink 2-3 drinks a night to help with her pain.  Counselor feels she is under reporting. Client stated that she has transportation problems and is essentially homes lee right now.  Client says that she is staying with a friend but really prefers to be alone. Client was not in good spirits today and rolled her eyes and several things that counselor said.  Client shared that she is frustrated and has little hope about hr future because of all the medical problems she has plus trying to work on her recovery. Counselor encouraged client to enter into detox to which client replied that they do not work and that she has attended several of them over the years. Counselor recommended that client consider outpatient treatment then and client indicated that she would have a very difficult time getting back and forth to the classes as she has no reliable transpiration. Client and counselor made another appointment for two weeks out. Counselor provided support and encouragement.   Rolena Infante, MA, [facility]

## 2015-02-02 ENCOUNTER — Ambulatory Visit: Payer: No Typology Code available for payment source | Admitting: *Deleted

## 2015-02-02 ENCOUNTER — Telehealth: Payer: Self-pay | Admitting: Family Medicine

## 2015-02-02 DIAGNOSIS — R062 Wheezing: Secondary | ICD-10-CM

## 2015-02-02 DIAGNOSIS — M7918 Myalgia, other site: Secondary | ICD-10-CM

## 2015-02-02 NOTE — Telephone Encounter (Signed)
albuterol (PROVENTIL HFA;VENTOLIN HFA) 108 (90 BASE) MCG/ACT inhaler traMADol (ULTRAM) 50 MG tablet

## 2015-02-02 NOTE — BH Specialist Note (Signed)
[name] was present for her scheduled appointment with counselor.  Client was in ok spirits and appeared a little more relaxed than last session together.  Client indicated that she had not had anything to drink in the last few days but has drank a few beers since the last appointment with counselor.  Client shared that she was having some serious withdrawals and asked if there was anything she could take to help with them. Counselor encouraged client to seek her doctor out for that request.  Counselor educated client on  how to deal with the holidays to avoid relapse.  Counselor gave tips for avoiding high risk situations during the celebrations.  Client received the information and gave appropriate feedback.  Client made another appointment for a few weeks out in order to touch base with counselor.  Rolena Infante, MA, [facility]

## 2015-02-06 MED ORDER — ALBUTEROL SULFATE HFA 108 (90 BASE) MCG/ACT IN AERS: 2.0000 | INHALATION_SPRAY | Freq: Four times a day (QID) | RESPIRATORY_TRACT | Status: DC | PRN

## 2015-02-06 MED ORDER — TRAMADOL HCL 50 MG PO TABS: 50.0000 mg | ORAL_TABLET | Freq: Two times a day (BID) | ORAL | Status: DC | PRN

## 2015-02-06 NOTE — Telephone Encounter (Signed)
Tramadol refilled  Please call patient for pick up

## 2015-02-06 NOTE — Telephone Encounter (Signed)
Left message with female to return call  Rx tramadol at front office ready to be pickup

## 2015-02-20 ENCOUNTER — Ambulatory Visit: Payer: No Typology Code available for payment source | Admitting: *Deleted

## 2015-02-24 ENCOUNTER — Emergency Department (HOSPITAL_COMMUNITY)
Admission: EM | Admit: 2015-02-24 | Discharge: 2015-02-24 | Disposition: A | Discharge status: Home / Self Care | Payer: No Typology Code available for payment source | Attending: Emergency Medicine | Admitting: Emergency Medicine

## 2015-02-24 ENCOUNTER — Emergency Department (HOSPITAL_COMMUNITY): Payer: No Typology Code available for payment source

## 2015-02-24 ENCOUNTER — Encounter (HOSPITAL_COMMUNITY): Payer: Self-pay | Admitting: *Deleted

## 2015-02-24 DIAGNOSIS — Z590 Homelessness: Secondary | ICD-10-CM | POA: Insufficient documentation

## 2015-02-24 DIAGNOSIS — Y9289 Other specified places as the place of occurrence of the external cause: Secondary | ICD-10-CM | POA: Insufficient documentation

## 2015-02-24 DIAGNOSIS — I10 Essential (primary) hypertension: Secondary | ICD-10-CM | POA: Insufficient documentation

## 2015-02-24 DIAGNOSIS — S92352A Displaced fracture of fifth metatarsal bone, left foot, initial encounter for closed fracture: Secondary | ICD-10-CM | POA: Insufficient documentation

## 2015-02-24 DIAGNOSIS — Y998 Other external cause status: Secondary | ICD-10-CM | POA: Insufficient documentation

## 2015-02-24 DIAGNOSIS — Y9389 Activity, other specified: Secondary | ICD-10-CM | POA: Insufficient documentation

## 2015-02-24 DIAGNOSIS — S92302A Fracture of unspecified metatarsal bone(s), left foot, initial encounter for closed fracture: Secondary | ICD-10-CM

## 2015-02-24 DIAGNOSIS — F17210 Nicotine dependence, cigarettes, uncomplicated: Secondary | ICD-10-CM | POA: Insufficient documentation

## 2015-02-24 DIAGNOSIS — S92322A Displaced fracture of second metatarsal bone, left foot, initial encounter for closed fracture: Secondary | ICD-10-CM | POA: Insufficient documentation

## 2015-02-24 DIAGNOSIS — W010XXA Fall on same level from slipping, tripping and stumbling without subsequent striking against object, initial encounter: Secondary | ICD-10-CM | POA: Insufficient documentation

## 2015-02-24 DIAGNOSIS — S99912A Unspecified injury of left ankle, initial encounter: Secondary | ICD-10-CM | POA: Insufficient documentation

## 2015-02-24 DIAGNOSIS — Z79899 Other long term (current) drug therapy: Secondary | ICD-10-CM | POA: Insufficient documentation

## 2015-02-24 MED ORDER — HYDROCODONE-ACETAMINOPHEN 5-325 MG PO TABS
1.0000 | ORAL_TABLET | Freq: Once | ORAL | Status: AC
  Administered 2015-02-24: 1 via ORAL
  Filled 2015-02-24: qty 1

## 2015-02-24 MED ORDER — HYDROCODONE-ACETAMINOPHEN 5-325 MG PO TABS: 1.0000 | ORAL_TABLET | ORAL | Status: DC | PRN

## 2015-02-24 NOTE — Discharge Instructions (Signed)
Please contact orthopedic specialist first thing Monday morning for further evaluation and management. If any new or worsening signs or symptoms present please return to the emergency room for further evaluation Metatarsal Fracture A metatarsal fracture is a break in a metatarsal bone. Metatarsal bones connect your toe bones to your ankle bones. CAUSES This type of fracture may be caused by:  A sudden twisting of your foot.  A fall onto your foot.  Overuse or repetitive exercise. RISK FACTORS This condition is more likely to develop in people who:  Play contact sports.  Have a bone disease.  Have a low calcium level. SYMPTOMS Symptoms of this condition include:  Pain that is worse when walking or standing.  Pain when pressing on the foot or moving the toes.  Swelling.  Bruising on the top or bottom of the foot.  A foot that appears shorter than the other one. DIAGNOSIS This condition is diagnosed with a physical exam. You may also have imaging tests, such as:  X-rays.  A CT scan.  MRI. TREATMENT Treatment for this condition depends on its severity and whether a bone has moved out of place. Treatment may involve:  Rest.  Wearing foot support such as a cast, splint, or boot for several weeks.  Using crutches.  Surgery to move bones back into the right position. Surgery is usually needed if there are many pieces of broken bone or bones that are very out of place (displaced fracture).  Physical therapy. This may be needed to help you regain full movement and strength in your foot. You will need to return to your health care provider to have X-rays taken until your bones heal. Your health care provider will look at the X-rays to make sure that your foot is healing well. HOME CARE INSTRUCTIONS  If You Have a Cast:  Do not stick anything inside the cast to scratch your skin. Doing that increases your risk of infection.  Check the skin around the cast every day.  Report any concerns to your health care provider. You may put lotion on dry skin around the edges of the cast. Do not apply lotion to the skin underneath the cast.  Keep the cast clean and dry. If You Have a Splint or a Supportive Boot:  Wear it as directed by your health care provider. Remove it only as directed by your health care provider.  Loosen it if your toes become numb and tingle, or if they turn cold and blue.  Keep it clean and dry. Bathing  Do not take baths, swim, or use a hot tub until your health care provider approves. Ask your health care provider if you can take showers. You may only be allowed to take sponge baths for bathing.  If your health care provider approves bathing and showering, cover the cast or splint with a watertight plastic bag to protect it from water. Do not let the cast or splint get wet. Managing Pain, Stiffness, and Swelling  If directed, apply ice to the injured area (if you have a splint, not a cast).  Put ice in a plastic bag.  Place a towel between your skin and the bag.  Leave the ice on for 20 minutes, 2-3 times per day.  Move your toes often to avoid stiffness and to lessen swelling.  Raise (elevate) the injured area above the level of your heart while you are sitting or lying down. Driving  Do not drive or operate heavy machinery while taking  pain medicine.  Do not drive while wearing foot support on a foot that you use for driving. Activity  Return to your normal activities as directed by your health care provider. Ask your health care provider what activities are safe for you.  Perform exercises as directed by your health care provider or physical therapist. Safety  Do not use the injured foot to support your body weight until your health care provider says that you can. Use crutches as directed by your health care provider. General Instructions  Do not put pressure on any part of the cast or splint until it is fully hardened.  This may take several hours.  Do not use any tobacco products, including cigarettes, chewing tobacco, or e-cigarettes. Tobacco can delay bone healing. If you need help quitting, ask your health care provider.  Take medicines only as directed by your health care provider.  Keep all follow-up visits as directed by your health care provider. This is important. SEEK MEDICAL CARE IF:  You have a fever.  Your cast, splint, or boot is too loose or too tight.  Your cast, splint, or boot is damaged.  Your pain medicine is not helping.  You have pain, tingling, or numbness in your foot that is not going away. SEEK IMMEDIATE MEDICAL CARE IF:  You have severe pain.  You have tingling or numbness in your foot that is getting worse.  Your foot feels cold or becomes numb.  Your foot changes color.   This information is not intended to replace advice given to you by your health care provider. Make sure you discuss any questions you have with your health care provider.   Document Released: 11/24/2001 Document Revised: 07/19/2014 Document Reviewed: 12/29/2013 Elsevier Interactive Patient Education [employer].

## 2015-02-24 NOTE — ED Notes (Signed)
Awake. Verbally responsive. A/O x4. Resp even and unlabored. No audible adventitious breath sounds noted. ABC's intact.  

## 2015-02-24 NOTE — ED Notes (Signed)
Pt left foot very swollen and bruised. Pain 10/10. Pedal pulse present.
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
## 2015-02-24 NOTE — ED Notes (Signed)
Per EMS, pt was moving furniture last night. Fell onto left side, now having pain to top of foot. EMS states it is red, swollen, and tender to touch.

## 2015-03-06 ENCOUNTER — Telehealth: Payer: Self-pay

## 2015-03-06 ENCOUNTER — Telehealth: Payer: Self-pay | Admitting: Family Medicine

## 2015-03-06 DIAGNOSIS — M7918 Myalgia, other site: Secondary | ICD-10-CM

## 2015-03-06 NOTE — Telephone Encounter (Signed)
Patient called to request med refills for all of her current medications, please f/u

## 2015-03-06 NOTE — Telephone Encounter (Signed)
Patient called nurse, patient verified date of birth. Patient is trying to reach Diane, with financial. Patient needs blood pressure medication refilled.  Nurse called [facility] pharmacy, patient has 1 refill left. [facility] pharmacy will refill BP medication.  Patient aware of [facility] pharmacy refilling medication. Nurse transferred patient to Diane, as requested by patient.

## 2015-03-08 NOTE — Telephone Encounter (Signed)
LVM to return call.

## 2015-03-08 NOTE — Telephone Encounter (Signed)
Pt. Was seeing if she could receive pain medication for broken foot. Was seen in ED for foot.  No open appointments, pt will schedule in Jan.

## 2015-03-09 MED ORDER — TRAMADOL HCL 50 MG PO TABS: 50.0000 mg | ORAL_TABLET | Freq: Two times a day (BID) | ORAL | Status: DC | PRN

## 2015-03-09 NOTE — Telephone Encounter (Signed)
Tramadol printed and ready for pick up

## 2015-03-14 NOTE — Telephone Encounter (Signed)
Pt aware Rx at front office ready to be pick up  

## 2015-03-21 ENCOUNTER — Ambulatory Visit: Payer: No Typology Code available for payment source

## 2015-03-31 ENCOUNTER — Other Ambulatory Visit: Payer: Self-pay | Admitting: Family Medicine

## 2015-03-31 DIAGNOSIS — F4321 Adjustment disorder with depressed mood: Secondary | ICD-10-CM

## 2015-04-03 MED FILL — traZODone HCL 50 MG TABS: 50 | 30 days supply | Qty: 30 | Fill #0

## 2015-04-04 ENCOUNTER — Ambulatory Visit: Payer: No Typology Code available for payment source | Admitting: Family Medicine

## 2015-04-21 ENCOUNTER — Encounter (HOSPITAL_BASED_OUTPATIENT_CLINIC_OR_DEPARTMENT_OTHER): Payer: No Typology Code available for payment source | Admitting: Clinical

## 2015-04-21 ENCOUNTER — Encounter: Payer: Self-pay | Admitting: Family Medicine

## 2015-04-21 ENCOUNTER — Ambulatory Visit: Payer: No Typology Code available for payment source | Attending: Family Medicine | Admitting: Family Medicine

## 2015-04-21 ENCOUNTER — Other Ambulatory Visit: Payer: Self-pay | Admitting: Family Medicine

## 2015-04-21 VITALS — BP 119/71 | HR 87 | Temp 98.7°F | Resp 16 | Ht 67.0 in | Wt 126.0 lb

## 2015-04-21 DIAGNOSIS — Z590 Homelessness: Secondary | ICD-10-CM | POA: Insufficient documentation

## 2015-04-21 DIAGNOSIS — K219 Gastro-esophageal reflux disease without esophagitis: Secondary | ICD-10-CM | POA: Insufficient documentation

## 2015-04-21 DIAGNOSIS — F329 Major depressive disorder, single episode, unspecified: Secondary | ICD-10-CM

## 2015-04-21 DIAGNOSIS — Z79899 Other long term (current) drug therapy: Secondary | ICD-10-CM | POA: Insufficient documentation

## 2015-04-21 DIAGNOSIS — I10 Essential (primary) hypertension: Secondary | ICD-10-CM | POA: Insufficient documentation

## 2015-04-21 DIAGNOSIS — M7918 Myalgia, other site: Secondary | ICD-10-CM

## 2015-04-21 DIAGNOSIS — R062 Wheezing: Secondary | ICD-10-CM | POA: Insufficient documentation

## 2015-04-21 DIAGNOSIS — F17210 Nicotine dependence, cigarettes, uncomplicated: Secondary | ICD-10-CM | POA: Insufficient documentation

## 2015-04-21 DIAGNOSIS — S92302D Fracture of unspecified metatarsal bone(s), left foot, subsequent encounter for fracture with routine healing: Secondary | ICD-10-CM

## 2015-04-21 DIAGNOSIS — F1410 Cocaine abuse, uncomplicated: Secondary | ICD-10-CM | POA: Insufficient documentation

## 2015-04-21 DIAGNOSIS — Z0000 Encounter for general adult medical examination without abnormal findings: Secondary | ICD-10-CM

## 2015-04-21 DIAGNOSIS — M791 Myalgia: Secondary | ICD-10-CM | POA: Insufficient documentation

## 2015-04-21 DIAGNOSIS — T7491XA Unspecified adult maltreatment, confirmed, initial encounter: Secondary | ICD-10-CM

## 2015-04-21 DIAGNOSIS — G8929 Other chronic pain: Secondary | ICD-10-CM

## 2015-04-21 DIAGNOSIS — F419 Anxiety disorder, unspecified: Principal | ICD-10-CM

## 2015-04-21 DIAGNOSIS — M25512 Pain in left shoulder: Secondary | ICD-10-CM | POA: Insufficient documentation

## 2015-04-21 DIAGNOSIS — F4321 Adjustment disorder with depressed mood: Secondary | ICD-10-CM | POA: Insufficient documentation

## 2015-04-21 DIAGNOSIS — F32A Depression, unspecified: Secondary | ICD-10-CM

## 2015-04-21 DIAGNOSIS — S92302A Fracture of unspecified metatarsal bone(s), left foot, initial encounter for closed fracture: Secondary | ICD-10-CM | POA: Insufficient documentation

## 2015-04-21 DIAGNOSIS — F418 Other specified anxiety disorders: Secondary | ICD-10-CM

## 2015-04-21 DIAGNOSIS — Z91410 Personal history of adult physical and sexual abuse: Secondary | ICD-10-CM | POA: Insufficient documentation

## 2015-04-21 DIAGNOSIS — Z1231 Encounter for screening mammogram for malignant neoplasm of breast: Secondary | ICD-10-CM

## 2015-04-21 LAB — POCT GLYCOSYLATED HEMOGLOBIN (HGB A1C): Hemoglobin A1C: 5.2

## 2015-04-21 MED ORDER — ALBUTEROL SULFATE HFA 108 (90 BASE) MCG/ACT IN AERS: 2.0000 | INHALATION_SPRAY | Freq: Four times a day (QID) | RESPIRATORY_TRACT | Status: DC | PRN

## 2015-04-21 MED ORDER — TRAZODONE HCL 50 MG PO TABS: 25.0000 mg | ORAL_TABLET | Freq: Every evening | ORAL | Status: DC | PRN

## 2015-04-21 MED ORDER — IBUPROFEN 800 MG PO TABS
800.0000 mg | ORAL_TABLET | Freq: Three times a day (TID) | ORAL | Status: DC | PRN
Start: 2015-04-21 — End: 2015-05-26

## 2015-04-21 MED ORDER — OMEPRAZOLE 20 MG PO CPDR: 20.0000 mg | DELAYED_RELEASE_CAPSULE | Freq: Every day | ORAL | Status: DC

## 2015-04-21 MED ORDER — TRAMADOL HCL 50 MG PO TABS: 50.0000 mg | ORAL_TABLET | Freq: Two times a day (BID) | ORAL | Status: DC | PRN

## 2015-04-21 MED FILL — IBUPROFEN 800 MG TABLET: 800 | 10 days supply | Qty: 30 | Fill #0

## 2015-04-21 MED FILL — traMADol HCL 50 MG TABS: 50 | 30 days supply | Qty: 60 | Fill #0

## 2015-04-21 MED FILL — ?OMEPRAZOLE DR 20 MG CAPSUL: 20 | 30 days supply | Qty: 30 | Fill #0

## 2015-04-21 MED FILL — VENTOLIN HFA 90 MCG INHALER: 108 (90 BAS | 25 days supply | Qty: 18 | Fill #0

## 2015-04-21 NOTE — Assessment & Plan Note (Deleted)
A: Blood pressure at goal. Meds: compliant  P: I will continue the patient's current medication regimen since her blood pressure is at goal.   
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
## 2015-04-21 NOTE — Progress Notes (Signed)
Medicine refill C/C chest congestion x 1 week  F/U Lt Fx  Pain scale #5  Rt shoulder pain Tobacco user 1ppday  No suicidal thoughts in the past two weeks

## 2015-04-21 NOTE — Patient Instructions (Signed)
[name] was seen today for foot injury, hypertension and shoulder pain.  Diagnoses and all orders for this visit:  Healthcare maintenance -     HgB A1c -     Flu Vaccine QUAD 36+ mos IM -     MM DIGITAL SCREENING BILATERAL; Future  Fracture of metatarsal of left foot, closed, with routine healing, subsequent encounter -     DG Foot Complete Left; Future  Cocaine abuse  Essential hypertension  Musculoskeletal pain -     ibuprofen (ADVIL,MOTRIN) 800 MG tablet; Take 1 tablet (800 mg total) by mouth every 8 (eight) hours as needed for moderate pain. -     traMADol (ULTRAM) 50 MG tablet; Take 1 tablet (50 mg total) by mouth every 12 (twelve) hours as needed.  Situational depression -     traZODone (DESYREL) 50 MG tablet; Take 0.5-1 tablets (25-50 mg total) by mouth at bedtime as needed for sleep.  Wheezing -     albuterol (PROVENTIL HFA;VENTOLIN HFA) 108 (90 Base) MCG/ACT inhaler; Inhale 2 puffs into the lungs every 6 (six) hours as needed for wheezing or shortness of breath.  Gastroesophageal reflux disease, esophagitis presence not specified -     omeprazole (PRILOSEC) 20 MG capsule; Take 1 capsule (20 mg total) by mouth daily.    Please go for foot x-ray at earliest convenience You will be called with mammogram appt   F/u on 05/05/15 for pap smear  Dr. Adrian Blackwater
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
## 2015-04-23 DIAGNOSIS — T7491XA Unspecified adult maltreatment, confirmed, initial encounter: Secondary | ICD-10-CM | POA: Insufficient documentation
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
## 2015-04-23 NOTE — Assessment & Plan Note (Signed)
A: she has chronic pain. No with L shoulder pain. She does smoke and use  Cocaine  P: Advised smoking cessation Advised cocaine avoidance Tramadol prn pain

## 2015-04-23 NOTE — Assessment & Plan Note (Signed)
Suspect healing Repeat x-rays ordered

## 2015-04-23 NOTE — Assessment & Plan Note (Signed)
Informed patient that cocaine use exacerbates MSK pain Advised avoidance

## 2015-04-24 DIAGNOSIS — Z0000 Encounter for general adult medical examination without abnormal findings: Secondary | ICD-10-CM

## 2015-04-26 ENCOUNTER — Other Ambulatory Visit: Payer: Self-pay | Admitting: Family Medicine

## 2015-04-26 DIAGNOSIS — G8929 Other chronic pain: Secondary | ICD-10-CM

## 2015-04-26 DIAGNOSIS — M7918 Myalgia, other site: Principal | ICD-10-CM

## 2015-04-28 MED FILL — GABAPENTIN 300 MG CAPSULE: 300 | 30 days supply | Qty: 180 | Fill #0

## 2015-05-01 MED FILL — IBUPROFEN 800 MG TABLET: 800 | 10 days supply | Qty: 30 | Fill #1

## 2015-05-01 MED FILL — traZODone HCL 50 MG TABS: 50 | 30 days supply | Qty: 30 | Fill #1

## 2015-05-09 ENCOUNTER — Encounter: Payer: Self-pay | Admitting: Family Medicine

## 2015-05-09 ENCOUNTER — Ambulatory Visit: Payer: No Typology Code available for payment source | Attending: Family Medicine | Admitting: Family Medicine

## 2015-05-09 VITALS — BP 148/87 | HR 92 | Temp 98.1°F | Resp 16 | Ht 67.0 in | Wt 137.0 lb

## 2015-05-09 DIAGNOSIS — N760 Acute vaginitis: Secondary | ICD-10-CM

## 2015-05-09 DIAGNOSIS — Z79899 Other long term (current) drug therapy: Secondary | ICD-10-CM | POA: Insufficient documentation

## 2015-05-09 DIAGNOSIS — R87810 Cervical high risk human papillomavirus (HPV) DNA test positive: Secondary | ICD-10-CM

## 2015-05-09 DIAGNOSIS — B9689 Other specified bacterial agents as the cause of diseases classified elsewhere: Secondary | ICD-10-CM

## 2015-05-09 DIAGNOSIS — Z1272 Encounter for screening for malignant neoplasm of vagina: Secondary | ICD-10-CM | POA: Insufficient documentation

## 2015-05-09 DIAGNOSIS — Z124 Encounter for screening for malignant neoplasm of cervix: Secondary | ICD-10-CM

## 2015-05-09 DIAGNOSIS — A499 Bacterial infection, unspecified: Secondary | ICD-10-CM

## 2015-05-09 NOTE — Patient Instructions (Signed)
Verba was seen today for gynecologic exam.  Diagnoses and all orders for this visit:  Pap smear for cervical cancer screening -     Cytology - PAP [place]   You will be called with pap results   F/u in 2-3 weeks for L shoulder pain  Dr. Adrian Blackwater

## 2015-05-09 NOTE — Progress Notes (Signed)
Pap smear  No vaginal discharge, no odor Sexually active, pain with sex Tobacco user 1 ppday  Pain scale #8 back pain No suicidal thought thoughts in the past two weeks

## 2015-05-09 NOTE — Progress Notes (Signed)
SUBJECTIVE:  55 y.o. female for pap.   Current Outpatient Prescriptions  Medication Sig Dispense Refill  . albuterol (PROVENTIL HFA;VENTOLIN HFA) 108 (90 Base) MCG/ACT inhaler Inhale 2 puffs into the lungs every 6 (six) hours as needed for wheezing or shortness of breath. 18 g 3  . gabapentin (NEURONTIN) 300 MG capsule TAKE 2 CAPSULES BY MOUTH 3 TIMES DAILY. 180 capsule 3  . ibuprofen (ADVIL,MOTRIN) 800 MG tablet Take 1 tablet (800 mg total) by mouth every 8 (eight) hours as needed for moderate pain. 30 tablet 2  . omeprazole (PRILOSEC) 20 MG capsule Take 1 capsule (20 mg total) by mouth daily. 30 capsule 5  . traMADol (ULTRAM) 50 MG tablet Take 1 tablet (50 mg total) by mouth every 12 (twelve) hours as needed. 60 tablet 0  . traZODone (DESYREL) 50 MG tablet Take 0.5-1 tablets (25-50 mg total) by mouth at bedtime as needed for sleep. 30 tablet 5   No current facility-administered medications for this visit.   Allergies: Review of patient's allergies indicates no known allergies.  No LMP recorded. Patient is postmenopausal.  ROS:  Feeling well. No dyspnea or chest pain on exertion.  No abdominal pain, change in bowel habits, black or bloody stools.  No urinary tract symptoms. GYN ROS: normal menses, no abnormal bleeding, pelvic pain or discharge, no breast pain or new or enlarging lumps on self exam. No neurological complaints.  OBJECTIVE:  The patient appears well, alert, oriented x 3, in no distress. BP 148/87 mmHg  Pulse 92  Temp(Src) 98.1 F (36.7 C) (Oral)  Resp 16  Ht 5\' 7"  (1.702 m)  Wt 137 lb (62.143 kg)  BMI 21.45 kg/m2  SpO2 100% ENT normal.  Neck supple. No adenopathy or thyromegaly. PERLAAbdomen soft without tenderness, guarding, mass or organomegaly. Extremities show no edema, normal peripheral pulses. Neurological is normal, no focal findings.  BREAST EXAM:  not examined  PELVIC EXAM: normal external genitalia, vulva, vagina, cervix, uterus and adnexa  ASSESSMENT:   well woman  PLAN:  pap smear

## 2015-05-10 ENCOUNTER — Ambulatory Visit: Payer: No Typology Code available for payment source

## 2015-05-11 DIAGNOSIS — R87810 Cervical high risk human papillomavirus (HPV) DNA test positive: Secondary | ICD-10-CM | POA: Insufficient documentation

## 2015-05-11 LAB — CERVICOVAGINAL ANCILLARY ONLY
CHLAMYDIA, DNA PROBE: NEGATIVE
Neisseria Gonorrhea: NEGATIVE
Wet Prep (BD Affirm): POSITIVE — AB

## 2015-05-11 LAB — CYTOLOGY - PAP

## 2015-05-11 MED ORDER — METRONIDAZOLE 500 MG PO TABS: 500.0000 mg | ORAL_TABLET | Freq: Two times a day (BID) | ORAL | Status: DC

## 2015-05-11 MED ORDER — FLUCONAZOLE 150 MG PO TABS
150.0000 mg | ORAL_TABLET | Freq: Once | ORAL | Status: DC
Start: 2015-05-11 — End: 2015-09-29

## 2015-05-11 NOTE — Addendum Note (Signed)
Addended by: Boykin Nearing on: 05/11/2015 10:49 PM   Modules accepted: Orders, SmartSet

## 2015-05-17 ENCOUNTER — Telehealth: Payer: Self-pay | Admitting: *Deleted

## 2015-05-17 NOTE — Telephone Encounter (Signed)
LVM to return call.

## 2015-05-17 NOTE — Telephone Encounter (Signed)
-----   Message from Boykin Nearing, MD sent at 05/11/2015 10:46 PM EST ----- HPV + pap with normal cytology (cells) repeat pap in one year BV on wet prep with treat with flagyl and diflucan

## 2015-05-19 ENCOUNTER — Other Ambulatory Visit: Payer: Self-pay | Admitting: Family Medicine

## 2015-05-19 DIAGNOSIS — G8929 Other chronic pain: Secondary | ICD-10-CM

## 2015-05-19 DIAGNOSIS — M7918 Myalgia, other site: Principal | ICD-10-CM

## 2015-05-19 MED FILL — FLUCONAZOLE 150 MG TABLET: 150 | 1 days supply | Qty: 1 | Fill #0

## 2015-05-19 MED FILL — metroNIDAZOLE 500 MG TABS: 500 | 7 days supply | Qty: 14 | Fill #0

## 2015-05-19 MED FILL — IBUPROFEN 800 MG TABLET: 800 | 10 days supply | Qty: 30 | Fill #2

## 2015-05-19 MED FILL — LISINOPRIL-HCTZ 10-12.5 MG: 10-12.5 | 30 days supply | Qty: 30 | Fill #3

## 2015-05-23 ENCOUNTER — Ambulatory Visit: Payer: No Typology Code available for payment source | Admitting: Family Medicine

## 2015-05-23 ENCOUNTER — Ambulatory Visit: Payer: No Typology Code available for payment source

## 2015-05-24 NOTE — Telephone Encounter (Signed)
LVM to return call   Rx Tramadol at front office ready to be pick up

## 2015-05-26 ENCOUNTER — Other Ambulatory Visit: Payer: Self-pay | Admitting: Family Medicine

## 2015-05-26 ENCOUNTER — Encounter: Payer: Self-pay | Admitting: Clinical

## 2015-05-26 MED FILL — traZODone HCL 50 MG TABS: 50 | 30 days supply | Qty: 30 | Fill #2

## 2015-05-26 MED FILL — !VENTOLIN HFA INHALER: 108 (90 BAS | 25 days supply | Qty: 18 | Fill #1

## 2015-05-26 MED FILL — GABAPENTIN 300 MG CAPSULE: 300 | 30 days supply | Qty: 180 | Fill #1

## 2015-05-26 NOTE — Progress Notes (Signed)
Depression screen [facility] 2/9 04/21/2015 12/22/2014 11/22/2014 08/09/2014 04/21/2014  Decreased Interest 1 (No Data) 1 2 3   Down, Depressed, Hopeless 1 - 2 2 3   PHQ - 2 Score 2 - 3 4 6   Altered sleeping 2 - 3 2 3   Tired, decreased energy 2 - 2 3 3   Change in appetite 2 - 2 1 3   Feeling bad or failure about yourself  1 - 2 0 3  Trouble concentrating 0 - 1 0 3  Moving slowly or fidgety/restless 0 - - 0 3  Suicidal thoughts 0 - 0 0 0  PHQ-9 Score 9 - 13 10 24     GAD 7 : Generalized Anxiety Score 04/21/2015  Nervous, Anxious, on Edge 3  Control/stop worrying 2  Worry too much - different things 2  Trouble relaxing 2  Restless 1  Easily annoyed or irritable 2  Afraid - awful might happen 1  Total GAD 7 Score 13

## 2015-06-06 ENCOUNTER — Ambulatory Visit
Admission: RE | Admit: 2015-06-06 | Discharge: 2015-06-06 | Disposition: A | Discharge status: Home / Self Care | Payer: No Typology Code available for payment source | Source: Ambulatory Visit | Attending: Family Medicine | Admitting: Family Medicine

## 2015-06-06 ENCOUNTER — Other Ambulatory Visit: Payer: Self-pay | Admitting: Family Medicine

## 2015-06-06 DIAGNOSIS — Z1231 Encounter for screening mammogram for malignant neoplasm of breast: Secondary | ICD-10-CM

## 2015-06-20 ENCOUNTER — Telehealth: Payer: Self-pay | Admitting: Family Medicine

## 2015-06-20 DIAGNOSIS — M7918 Myalgia, other site: Principal | ICD-10-CM

## 2015-06-20 DIAGNOSIS — G8929 Other chronic pain: Secondary | ICD-10-CM

## 2015-06-20 MED ORDER — TRAMADOL HCL 50 MG PO TABS: 50.0000 mg | ORAL_TABLET | Freq: Two times a day (BID) | ORAL | Status: DC | PRN

## 2015-06-20 MED ORDER — IBUPROFEN 600 MG PO TABS: 600.0000 mg | ORAL_TABLET | Freq: Three times a day (TID) | ORAL | Status: DC | PRN

## 2015-06-20 NOTE — Telephone Encounter (Signed)
Patient is requesting medication refill for tramadol, ibuprofen 800mg .Marland Kitchen..please follow up with patient

## 2015-06-20 NOTE — Telephone Encounter (Signed)
Ibuprofen and tramadol refilled

## 2015-06-21 MED FILL — IBUPROFEN 600 MG TABLET: 600 | 10 days supply | Qty: 30 | Fill #0

## 2015-06-21 MED FILL — traMADol HCL 50 MG TABS: 50 | 30 days supply | Qty: 60 | Fill #0

## 2015-06-21 NOTE — Telephone Encounter (Signed)
Pt notified Rx at front office ready to be pickup 

## 2015-06-22 ENCOUNTER — Telehealth: Payer: Self-pay | Admitting: Family Medicine

## 2015-06-22 NOTE — Telephone Encounter (Signed)
Pt. Came into facility requesting her pap and mammogram results. Please f/u with pt.

## 2015-06-28 NOTE — Telephone Encounter (Signed)
Unable to contact pt Phone call was answer by female  Stated "she is not here" and hung up    HPV + pap with normal cytology (cells) repeat pap in one year BV on wet prep with treat with flagyl and diflucan

## 2015-06-28 NOTE — Telephone Encounter (Signed)
-----   Message from Boykin Nearing, MD sent at 06/27/2015  8:38 AM EDT ----- Possible L breast mass on screening mammogram Follow up imaging needed Patient will be contacted by breast imaging center to schedule

## 2015-07-03 ENCOUNTER — Other Ambulatory Visit: Payer: Self-pay | Admitting: Family Medicine

## 2015-07-03 DIAGNOSIS — R928 Other abnormal and inconclusive findings on diagnostic imaging of breast: Secondary | ICD-10-CM

## 2015-07-11 ENCOUNTER — Other Ambulatory Visit: Payer: Self-pay | Admitting: Family Medicine

## 2015-07-11 DIAGNOSIS — G8929 Other chronic pain: Secondary | ICD-10-CM

## 2015-07-11 DIAGNOSIS — M7918 Myalgia, other site: Principal | ICD-10-CM

## 2015-07-11 MED FILL — traZODone HCL 50 MG TABS: 50 | 30 days supply | Qty: 30 | Fill #3

## 2015-07-11 MED FILL — GABAPENTIN 300 MG CAPSULE: 300 | 30 days supply | Qty: 180 | Fill #2

## 2015-07-11 MED FILL — !VENTOLIN HFA INHALER: 108 (90 BAS | 25 days supply | Qty: 18 | Fill #2

## 2015-07-11 MED FILL — ?OMEPRAZOLE DR 20 MG CAPSUL: 20 | 30 days supply | Qty: 30 | Fill #1

## 2015-07-11 MED FILL — LISINOPRIL-HCTZ 10-12.5 MG: 10-12.5 | 30 days supply | Qty: 30 | Fill #4

## 2015-07-12 ENCOUNTER — Other Ambulatory Visit: Payer: No Typology Code available for payment source

## 2015-07-12 ENCOUNTER — Ambulatory Visit
Admission: RE | Admit: 2015-07-12 | Discharge: 2015-07-12 | Disposition: A | Discharge status: Home / Self Care | Payer: No Typology Code available for payment source | Source: Ambulatory Visit | Attending: Family Medicine | Admitting: Family Medicine

## 2015-07-12 DIAGNOSIS — R928 Other abnormal and inconclusive findings on diagnostic imaging of breast: Secondary | ICD-10-CM

## 2015-07-24 ENCOUNTER — Other Ambulatory Visit: Payer: Self-pay | Admitting: Family Medicine

## 2015-07-24 DIAGNOSIS — G8929 Other chronic pain: Secondary | ICD-10-CM

## 2015-07-24 DIAGNOSIS — M7918 Myalgia, other site: Principal | ICD-10-CM

## 2015-07-24 MED FILL — IBUPROFEN 600 MG TABLET: 600 | 10 days supply | Qty: 30 | Fill #0

## 2015-07-24 NOTE — Telephone Encounter (Signed)
Tramadol refilled Please inform patient

## 2015-07-25 NOTE — Telephone Encounter (Signed)
Pt aware Rx at front office ready to be pick up  

## 2015-07-26 MED FILL — traMADol HCL 50 MG TABS: 50 | 30 days supply | Qty: 60 | Fill #0

## 2015-07-26 NOTE — Telephone Encounter (Signed)
Pt in our clinic today  Pap smear and wet prep results given  Pt verbalized understanding  Repeat pap in one year  Repeat mammogram in one year

## 2015-08-08 ENCOUNTER — Ambulatory Visit: Payer: No Typology Code available for payment source | Admitting: Family Medicine

## 2015-08-18 ENCOUNTER — Other Ambulatory Visit: Payer: Self-pay | Admitting: Internal Medicine

## 2015-08-18 ENCOUNTER — Other Ambulatory Visit: Payer: Self-pay | Admitting: Family Medicine

## 2015-08-18 MED FILL — LISINOPRIL-HCTZ 10-12.5 MG: 10-12.5 | 30 days supply | Qty: 30 | Fill #0

## 2015-08-18 MED FILL — GABAPENTIN 300 MG CAPSULE: 300 | 30 days supply | Qty: 180 | Fill #3

## 2015-08-18 MED FILL — traZODone HCL 50 MG TABS: 50 | 30 days supply | Qty: 30 | Fill #0

## 2015-08-18 MED FILL — VENTOLIN HFA 90 MCG INHALER: 108 (90 BAS | 25 days supply | Qty: 18 | Fill #3

## 2015-08-29 ENCOUNTER — Ambulatory Visit: Payer: No Typology Code available for payment source | Admitting: Family Medicine

## 2015-09-07 ENCOUNTER — Other Ambulatory Visit: Payer: Self-pay | Admitting: Family Medicine

## 2015-09-12 MED FILL — IBUPROFEN 600 MG TABLET: 600 | 10 days supply | Qty: 30 | Fill #1

## 2015-09-12 MED FILL — ?OMEPRAZOLE DR 20 MG CAPSUL: 20 | 30 days supply | Qty: 30 | Fill #2

## 2015-09-12 MED FILL — traZODone HCL 50 MG TABS: 50 | 30 days supply | Qty: 30 | Fill #1

## 2015-09-12 MED FILL — !VENTOLIN HFA INHALER: 108 (90 BAS | 25 days supply | Qty: 18 | Fill #1

## 2015-09-20 ENCOUNTER — Other Ambulatory Visit: Payer: Self-pay | Admitting: Family Medicine

## 2015-09-20 MED FILL — GABAPENTIN 300 MG CAPSULE: 300 | 30 days supply | Qty: 180 | Fill #4

## 2015-09-29 ENCOUNTER — Encounter: Payer: Self-pay | Admitting: Family Medicine

## 2015-09-29 ENCOUNTER — Ambulatory Visit: Payer: No Typology Code available for payment source | Attending: Family Medicine | Admitting: Family Medicine

## 2015-09-29 VITALS — BP 140/80 | HR 74 | Temp 99.2°F | Resp 20 | Ht 67.5 in | Wt 134.0 lb

## 2015-09-29 DIAGNOSIS — B182 Chronic viral hepatitis C: Secondary | ICD-10-CM

## 2015-09-29 DIAGNOSIS — F1411 Cocaine abuse, in remission: Secondary | ICD-10-CM

## 2015-09-29 DIAGNOSIS — R55 Syncope and collapse: Secondary | ICD-10-CM

## 2015-09-29 DIAGNOSIS — I10 Essential (primary) hypertension: Secondary | ICD-10-CM

## 2015-09-29 DIAGNOSIS — M7918 Myalgia, other site: Secondary | ICD-10-CM

## 2015-09-29 DIAGNOSIS — F1010 Alcohol abuse, uncomplicated: Secondary | ICD-10-CM | POA: Insufficient documentation

## 2015-09-29 DIAGNOSIS — F1410 Cocaine abuse, uncomplicated: Secondary | ICD-10-CM

## 2015-09-29 DIAGNOSIS — M791 Myalgia: Secondary | ICD-10-CM

## 2015-09-29 DIAGNOSIS — G8929 Other chronic pain: Secondary | ICD-10-CM

## 2015-09-29 LAB — CBC
HCT: 25 % — ABNORMAL LOW (ref 35.0–45.0)
HEMOGLOBIN: 8.1 g/dL — AB (ref 11.7–15.5)
MCH: 27 pg (ref 27.0–33.0)
MCHC: 32.4 g/dL (ref 32.0–36.0)
MCV: 83.3 fL (ref 80.0–100.0)
MPV: 9.7 fL (ref 7.5–12.5)
PLATELETS: 389 10*3/uL (ref 140–400)
RBC: 3 MIL/uL — ABNORMAL LOW (ref 3.80–5.10)
RDW: 18.1 % — ABNORMAL HIGH (ref 11.0–15.0)
WBC: 3.7 10*3/uL — AB (ref 3.8–10.8)

## 2015-09-29 MED ORDER — TRAMADOL HCL 50 MG PO TABS: 50.0000 mg | ORAL_TABLET | Freq: Two times a day (BID) | ORAL | Status: DC | PRN

## 2015-09-29 MED ORDER — LISINOPRIL 20 MG PO TABS: 20.0000 mg | ORAL_TABLET | Freq: Every day | ORAL | Status: DC

## 2015-09-29 MED FILL — traMADol HCL 50 MG TABS: 50 | 30 days supply | Qty: 60 | Fill #0

## 2015-09-29 MED FILL — LISINOPRIL 20 MG TABLET: 20 | 30 days supply | Qty: 30 | Fill #0

## 2015-09-29 NOTE — Assessment & Plan Note (Signed)
Hx of abuse of cocaine Patient denies ongoing and request tramadol  Refill tramadol UDS

## 2015-09-29 NOTE — Patient Instructions (Addendum)
[name] was seen today for hypertension and loss of consciousness.  Diagnoses and all orders for this visit:  History of cocaine abuse -     Urine Drug Screen w/Alc, no confirm  Syncope and collapse -     CBC -     COMPLETE METABOLIC PANEL WITH GFR  Chronic musculoskeletal pain -     traMADol (ULTRAM) 50 MG tablet; Take 1 tablet (50 mg total) by mouth every 12 (twelve) hours as needed.  Essential hypertension -     lisinopril (PRINIVIL,ZESTRIL) 20 MG tablet; Take 1 tablet (20 mg total) by mouth daily.   F/u in 4 weeks with pharmacist for BP check F/u with provider in 3 months for HTN  Dr. Adrian Blackwater

## 2015-09-29 NOTE — Assessment & Plan Note (Signed)
A: chronic HTN Recent syncope P: CMP Stop prinzide Lisinopril 20 mg daily

## 2015-09-29 NOTE — Progress Notes (Signed)
Subjective:  Patient ID: [name], female    DOB: 10-Sep-1960  Age: 55 y.o. MRN: [id number]  CC: Hypertension   HPI [name] has HTN, hx of cocaine abuse, depression, homelessness, hep C, alcohol abuse she  presents for    1. HTN f/u: she is taking prinzide. She is spacing out the pills and taking 1 every 2-3 days as she was low on medication and did not have refills. No denies HA, CP, SOB. She has L foot swelling. She has   2. Syncope: occurred two weeks ago. She felt thirsty, dizzy and tired then fainted. This was after doing yard work, taking the bus, walking in the heat. She fainted outside of a [facility] after going inside for water. She hit her nose, L side of head over eyebrow and L lateral hip. She has hip pain. No headache, CP, dizziness or lightheadedness now. She reports drinking beer but denies associated intoxication. She reports not using cocaine in over 1 year.   3. R lateral hip pain: she has hx of chronic back pain. R lateral hip started hurting more since her fall two weeks ago. No bruising or swelling. She walks unassisted. She request pain medication. She admits to alcohol, last use 2 days ago. She denies cocaine use.   Social History  Substance Use Topics  . Smoking status: Current Every Day Smoker -- 1.00 packs/day for 0 years    Types: Cigarettes  . Smokeless tobacco: Not on file  . Alcohol Use: 0.0 oz/week    0 Standard drinks or equivalent per week     Comment: ETHO Daily     Outpatient Prescriptions Prior to Visit  Medication Sig Dispense Refill  . albuterol (PROVENTIL HFA;VENTOLIN HFA) 108 (90 Base) MCG/ACT inhaler Inhale 2 puffs into the lungs every 6 (six) hours as needed for wheezing or shortness of breath. 18 g 3  . fluconazole (DIFLUCAN) 150 MG tablet Take 1 tablet (150 mg total) by mouth once. 1 tablet 0  . gabapentin (NEURONTIN) 300 MG capsule TAKE 2 CAPSULES BY MOUTH 3 TIMES DAILY. 180 capsule 3  . ibuprofen (ADVIL,MOTRIN) 600 MG tablet TAKE 1  TABLET BY MOUTH EVERY 8 HOURS AS NEEDED FOR MODERATE PAIN. 30 tablet 0  . lisinopril-hydrochlorothiazide (PRINZIDE,ZESTORETIC) 10-12.5 MG tablet Take 1 tablet by mouth daily. Must have office visit for refills 30 tablet 0  . metroNIDAZOLE (FLAGYL) 500 MG tablet Take 1 tablet (500 mg total) by mouth 2 (two) times daily. 14 tablet 0  . omeprazole (PRILOSEC) 20 MG capsule Take 1 capsule (20 mg total) by mouth daily. 30 capsule 5  . traMADol (ULTRAM) 50 MG tablet TAKE 1 TABLET BY MOUTH EVERY 12 HOURS AS NEEDED 60 tablet 2  . traZODone (DESYREL) 50 MG tablet TAKE 1/2 TO 1 TABLET BY MOUTH AT BEDTIME AS NEEDED FOR SLEEP. 30 tablet 3   No facility-administered medications prior to visit.    ROS Review of Systems  Constitutional: Negative for fever and chills.  Eyes: Negative for visual disturbance.  Respiratory: Negative for shortness of breath.   Cardiovascular: Negative for chest pain.  Gastrointestinal: Negative for abdominal pain and blood in stool.  Musculoskeletal: Positive for joint swelling (L foot swelling ) and arthralgias. Negative for back pain.  Skin: Negative for rash.  Allergic/Immunologic: Negative for immunocompromised state.  Neurological: Positive for syncope.  Hematological: Negative for adenopathy. Does not bruise/bleed easily.  Psychiatric/Behavioral: Negative for suicidal ideas and dysphoric mood.    Objective:  BP  140/80 mmHg  Pulse 74  Temp(Src) 99.2 F (37.3 C) (Oral)  Resp 20  Ht 5' 7.5" (1.715 m)  Wt 134 lb (60.782 kg)  BMI 20.67 kg/m2  SpO2 100%  BP/Weight 09/29/2015 99991111 [id number]  Systolic BP [id number] 123456 123456  Diastolic BP 80 87 71  Wt. (Lbs) 134 137 126  BMI 20.67 21.45 19.73   Physical Exam  Constitutional: She is oriented to person, place, and time. She appears well-developed and well-nourished. No distress.  Thin, tanned, female.    HENT:  Head: Normocephalic and atraumatic.  Cardiovascular: Normal rate, regular rhythm, normal heart sounds and  intact distal pulses.   Pulmonary/Chest: Effort normal and breath sounds normal.  Abdominal: Soft. Bowel sounds are normal. She exhibits no distension and no mass. There is no tenderness. There is no rebound and no guarding.  Musculoskeletal: She exhibits no edema.       Right hip: She exhibits decreased range of motion and tenderness. She exhibits normal strength, no bony tenderness, no swelling, no crepitus and no deformity.  Pain with hip rotation   Neurological: She is alert and oriented to person, place, and time.  Skin: Skin is warm and dry. No rash noted.     Psychiatric: She has a normal mood and affect.     Assessment & Plan:  [name] was seen today for hypertension and loss of consciousness.  Diagnoses and all orders for this visit:  History of cocaine abuse -     Urine Drug Screen w/Alc, no confirm  Syncope and collapse -     CBC -     COMPLETE METABOLIC PANEL WITH GFR  Chronic musculoskeletal pain -     traMADol (ULTRAM) 50 MG tablet; Take 1 tablet (50 mg total) by mouth every 12 (twelve) hours as needed.  Essential hypertension -     lisinopril (PRINIVIL,ZESTRIL) 20 MG tablet; Take 1 tablet (20 mg total) by mouth daily.  Alcohol abuse  Chronic hepatitis C without hepatic coma (HCC)   There are no diagnoses linked to this encounter.  No orders of the defined types were placed in this encounter.    Follow-up: Return in about 4 weeks (around 10/27/2015) for pharmacist BP check .   Boykin Nearing MD

## 2015-09-29 NOTE — Assessment & Plan Note (Addendum)
Syncope in homeless female with HTN, ETOH abuse. There was a prodrome. I suspect vasovagal syncope.  P: CBC, CMP Advised liberal hydration  Stop prinzide to avoid dehydration and hyponatremia  Lisinopril 20 mg for BP control  Tramadol for control of R hip pain

## 2015-09-30 LAB — COMPLETE METABOLIC PANEL WITH GFR
ALT: 35 U/L — ABNORMAL HIGH (ref 6–29)
AST: 97 U/L — AB (ref 10–35)
Albumin: 3.7 g/dL (ref 3.6–5.1)
Alkaline Phosphatase: 80 U/L (ref 33–130)
BUN: 20 mg/dL (ref 7–25)
CHLORIDE: 103 mmol/L (ref 98–110)
CO2: 19 mmol/L — AB (ref 20–31)
Calcium: 9.1 mg/dL (ref 8.6–10.4)
Creat: 1.13 mg/dL — ABNORMAL HIGH (ref 0.50–1.05)
GFR, EST NON AFRICAN AMERICAN: 55 mL/min — AB (ref >=60)
GFR, Est African American: 63 mL/min (ref >=60)
GLUCOSE: 77 mg/dL (ref 65–99)
POTASSIUM: 5 mmol/L (ref 3.5–5.3)
SODIUM: 138 mmol/L (ref 135–146)
Total Bilirubin: 0.3 mg/dL (ref 0.2–1.2)
Total Protein: 7.5 g/dL (ref 6.1–8.1)

## 2015-10-01 NOTE — Addendum Note (Signed)
Addended by: Kristen Cardinal on: 10/01/2015 07:15 PM   Modules accepted: Miquel Dunn

## 2015-10-03 ENCOUNTER — Telehealth: Payer: Self-pay

## 2015-10-03 NOTE — Telephone Encounter (Signed)
Called received from [place] stating drug screen test requested [phone] is no longer active.  Provider will need to put in a new order.  Representative suggested Test order [id number] and also states if you use this order it does not include alcohol. So alcohol screen would need to be ordered separately.  Message rotated to ordering provider for review. Priscille Heidelberg, RN, BSN

## 2015-10-04 ENCOUNTER — Telehealth: Payer: Self-pay

## 2015-10-04 NOTE — Telephone Encounter (Signed)
Per representative previous drug screen ordered was replaced with order [id number].  No further action needed. Issue resolved without provider intervention. Priscille Heidelberg, RN, BSN

## 2015-10-07 ENCOUNTER — Encounter (HOSPITAL_COMMUNITY): Payer: Self-pay | Admitting: Emergency Medicine

## 2015-10-07 DIAGNOSIS — Z79899 Other long term (current) drug therapy: Secondary | ICD-10-CM | POA: Insufficient documentation

## 2015-10-07 DIAGNOSIS — R600 Localized edema: Secondary | ICD-10-CM | POA: Insufficient documentation

## 2015-10-07 DIAGNOSIS — F17210 Nicotine dependence, cigarettes, uncomplicated: Secondary | ICD-10-CM | POA: Insufficient documentation

## 2015-10-07 DIAGNOSIS — I10 Essential (primary) hypertension: Secondary | ICD-10-CM | POA: Insufficient documentation
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
## 2015-10-08 ENCOUNTER — Emergency Department (HOSPITAL_COMMUNITY)
Admission: EM | Admit: 2015-10-08 | Discharge: 2015-10-08 | Disposition: A | Discharge status: Home / Self Care | Payer: No Typology Code available for payment source | Attending: Emergency Medicine | Admitting: Emergency Medicine

## 2015-10-08 DIAGNOSIS — R600 Localized edema: Secondary | ICD-10-CM

## 2015-10-08 LAB — CBC WITH DIFFERENTIAL/PLATELET
BASOS ABS: 0.1 10*3/uL (ref 0.0–0.1)
Basophils Relative: 1 %
EOS PCT: 4 %
Eosinophils Absolute: 0.2 10*3/uL (ref 0.0–0.7)
HEMATOCRIT: 26.2 % — AB (ref 36.0–46.0)
HEMOGLOBIN: 8.4 g/dL — AB (ref 12.0–15.0)
LYMPHS PCT: 39 %
Lymphs Abs: 1.6 10*3/uL (ref 0.7–4.0)
MCH: 26 pg (ref 26.0–34.0)
MCHC: 32.1 g/dL (ref 30.0–36.0)
MCV: 81.1 fL (ref 78.0–100.0)
Monocytes Absolute: 0.5 10*3/uL (ref 0.1–1.0)
Monocytes Relative: 12 %
NEUTROS ABS: 1.8 10*3/uL (ref 1.7–7.7)
NEUTROS PCT: 44 %
PLATELETS: 326 10*3/uL (ref 150–400)
RBC: 3.23 MIL/uL — AB (ref 3.87–5.11)
RDW: 16.7 % — ABNORMAL HIGH (ref 11.5–15.5)
WBC: 4 10*3/uL (ref 4.0–10.5)

## 2015-10-08 LAB — BASIC METABOLIC PANEL
ANION GAP: 13 (ref 5–15)
BUN: 12 mg/dL (ref 6–20)
CHLORIDE: 97 mmol/L — AB (ref 101–111)
CO2: 20 mmol/L — ABNORMAL LOW (ref 22–32)
Calcium: 9.2 mg/dL (ref 8.9–10.3)
Creatinine, Ser: 1.25 mg/dL — ABNORMAL HIGH (ref 0.44–1.00)
GFR calc Af Amer: 55 mL/min — ABNORMAL LOW (ref >60)
GFR, EST NON AFRICAN AMERICAN: 48 mL/min — AB (ref >60)
Glucose, Bld: 86 mg/dL (ref 65–99)
POTASSIUM: 3.9 mmol/L (ref 3.5–5.1)
SODIUM: 130 mmol/L — AB (ref 135–145)

## 2015-10-08 MED ORDER — IBUPROFEN 600 MG PO TABS: 600.0000 mg | ORAL_TABLET | Freq: Four times a day (QID) | ORAL | 0 refills | Status: DC | PRN

## 2015-10-08 MED ORDER — OXYCODONE-ACETAMINOPHEN 5-325 MG PO TABS
ORAL_TABLET | ORAL | Status: AC
  Filled 2015-10-08: qty 1

## 2015-10-08 NOTE — ED Notes (Signed)
Pt has swelling noted to her right leg around the ankle. Pt has good pulses noted to legs bilaterally. Alert and oriented x4. Cough noted. Pt stable on her feet. Walking around the room with no unsteady gait. Friend at the bedside.

## 2015-10-08 NOTE — ED Notes (Signed)
Found patient sleeping on bench

## 2015-10-08 NOTE — ED Notes (Signed)
Called to re-check vitals. No Answer. 2nd time calling.

## 2015-10-08 NOTE — ED Notes (Signed)
Pt up to bathroom has been sleeping for the past few hours  Still c/o pain
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
## 2015-10-11 ENCOUNTER — Telehealth: Payer: Self-pay

## 2015-10-11 NOTE — Telephone Encounter (Signed)
Left message for patient to return phone call.  

## 2015-10-16 ENCOUNTER — Telehealth: Payer: Self-pay

## 2015-10-16 NOTE — Telephone Encounter (Signed)
Patient was called on 07/31 patient did not answer and no voicemail.

## 2015-10-17 ENCOUNTER — Telehealth: Payer: Self-pay

## 2015-10-17 NOTE — Telephone Encounter (Signed)
Patients mobile number is not correct. The other number that is on file I left 2 messages and no return call. Her results will be mailed out to her on 8/1

## 2015-10-19 ENCOUNTER — Other Ambulatory Visit: Payer: Self-pay | Admitting: Family Medicine

## 2015-10-19 DIAGNOSIS — M7918 Myalgia, other site: Principal | ICD-10-CM

## 2015-10-19 DIAGNOSIS — G8929 Other chronic pain: Secondary | ICD-10-CM

## 2015-11-06 ENCOUNTER — Other Ambulatory Visit: Payer: Self-pay | Admitting: Family Medicine

## 2015-11-06 DIAGNOSIS — M7918 Myalgia, other site: Principal | ICD-10-CM

## 2015-11-06 DIAGNOSIS — G8929 Other chronic pain: Secondary | ICD-10-CM

## 2015-11-21 ENCOUNTER — Other Ambulatory Visit: Payer: Self-pay | Admitting: Family Medicine

## 2015-11-21 MED FILL — traZODone HCL 50 MG TABS: 50 | 30 days supply | Qty: 30 | Fill #2

## 2015-11-21 MED FILL — IBUPROFEN 600 MG TABLET: 600 | 10 days supply | Qty: 30 | Fill #0

## 2015-11-21 MED FILL — GABAPENTIN 300 MG CAPSULE: 300 | 30 days supply | Qty: 180 | Fill #0

## 2015-11-21 MED FILL — VENTOLIN HFA 90 MCG INHALER: 108 (90 BAS | 25 days supply | Qty: 18 | Fill #2

## 2015-11-27 ENCOUNTER — Ambulatory Visit: Payer: Self-pay | Admitting: Family Medicine

## 2015-12-13 ENCOUNTER — Ambulatory Visit: Payer: Self-pay

## 2015-12-18 MED FILL — ?LISINOPRIL 20 MG TABLET: 20 | 30 days supply | Qty: 30 | Fill #1

## 2015-12-18 MED FILL — GABAPENTIN 300 MG CAPSULE: 300 | 30 days supply | Qty: 180 | Fill #1

## 2015-12-18 MED FILL — ?OMEPRAZOLE DR 20 MG CAPSUL: 20 | 30 days supply | Qty: 30 | Fill #3

## 2015-12-18 MED FILL — traZODone HCL 50 MG TABS: 50 | 30 days supply | Qty: 30 | Fill #3

## 2016-01-12 ENCOUNTER — Ambulatory Visit: Payer: Self-pay | Attending: Internal Medicine

## 2016-01-15 ENCOUNTER — Ambulatory Visit: Payer: Self-pay | Admitting: Family Medicine

## 2016-01-15 MED FILL — GABAPENTIN 300 MG CAPSULE: 300 | 30 days supply | Qty: 180 | Fill #2

## 2016-01-15 MED FILL — traZODone HCL 50 MG TABS: 50 | 30 days supply | Qty: 30 | Fill #4

## 2016-01-15 MED FILL — ?OMEPRAZOLE DR 20 MG CAPSUL: 20 | 30 days supply | Qty: 30 | Fill #4

## 2016-01-15 MED FILL — ?LISINOPRIL 20 MG TABLET: 20 | 30 days supply | Qty: 30 | Fill #2

## 2016-01-15 MED FILL — IBUPROFEN 600 MG TABLET: 600 | 10 days supply | Qty: 30 | Fill #1

## 2016-01-16 ENCOUNTER — Ambulatory Visit: Payer: Self-pay | Attending: Family Medicine | Admitting: Family Medicine

## 2016-01-16 ENCOUNTER — Encounter: Payer: Self-pay | Admitting: Licensed Clinical Social Worker

## 2016-01-16 ENCOUNTER — Encounter: Payer: Self-pay | Admitting: Family Medicine

## 2016-01-16 VITALS — BP 139/76 | HR 79 | Temp 97.6°F | Ht 67.5 in | Wt 135.4 lb

## 2016-01-16 DIAGNOSIS — Z590 Homelessness: Secondary | ICD-10-CM | POA: Insufficient documentation

## 2016-01-16 DIAGNOSIS — I10 Essential (primary) hypertension: Secondary | ICD-10-CM | POA: Insufficient documentation

## 2016-01-16 DIAGNOSIS — E871 Hypo-osmolality and hyponatremia: Secondary | ICD-10-CM

## 2016-01-16 DIAGNOSIS — D509 Iron deficiency anemia, unspecified: Secondary | ICD-10-CM

## 2016-01-16 DIAGNOSIS — F1411 Cocaine abuse, in remission: Secondary | ICD-10-CM | POA: Insufficient documentation

## 2016-01-16 DIAGNOSIS — M7918 Myalgia, other site: Secondary | ICD-10-CM

## 2016-01-16 DIAGNOSIS — Z0000 Encounter for general adult medical examination without abnormal findings: Secondary | ICD-10-CM

## 2016-01-16 DIAGNOSIS — M791 Myalgia: Secondary | ICD-10-CM | POA: Insufficient documentation

## 2016-01-16 DIAGNOSIS — F329 Major depressive disorder, single episode, unspecified: Secondary | ICD-10-CM | POA: Insufficient documentation

## 2016-01-16 DIAGNOSIS — F32A Depression, unspecified: Secondary | ICD-10-CM | POA: Insufficient documentation

## 2016-01-16 DIAGNOSIS — F419 Anxiety disorder, unspecified: Secondary | ICD-10-CM | POA: Insufficient documentation

## 2016-01-16 DIAGNOSIS — G8929 Other chronic pain: Secondary | ICD-10-CM | POA: Insufficient documentation

## 2016-01-16 DIAGNOSIS — B1920 Unspecified viral hepatitis C without hepatic coma: Secondary | ICD-10-CM | POA: Insufficient documentation

## 2016-01-16 DIAGNOSIS — F17210 Nicotine dependence, cigarettes, uncomplicated: Secondary | ICD-10-CM | POA: Insufficient documentation

## 2016-01-16 DIAGNOSIS — F1011 Alcohol abuse, in remission: Secondary | ICD-10-CM | POA: Insufficient documentation

## 2016-01-16 DIAGNOSIS — M7989 Other specified soft tissue disorders: Secondary | ICD-10-CM | POA: Insufficient documentation

## 2016-01-16 LAB — CBC
HCT: 23.9 % — ABNORMAL LOW (ref 35.0–45.0)
Hemoglobin: 7.1 g/dL — ABNORMAL LOW (ref 11.7–15.5)
MCH: 22.2 pg — ABNORMAL LOW (ref 27.0–33.0)
MCHC: 29.7 g/dL — ABNORMAL LOW (ref 32.0–36.0)
MCV: 74.7 fL — AB (ref 80.0–100.0)
MPV: 8.9 fL (ref 7.5–12.5)
PLATELETS: 284 10*3/uL (ref 140–400)
RBC: 3.2 MIL/uL — AB (ref 3.80–5.10)
RDW: 18.2 % — AB (ref 11.0–15.0)
WBC: 5.1 10*3/uL (ref 3.8–10.8)

## 2016-01-16 LAB — COMPLETE METABOLIC PANEL WITH GFR
ALT: 18 U/L (ref 6–29)
AST: 44 U/L — ABNORMAL HIGH (ref 10–35)
Albumin: 4.2 g/dL (ref 3.6–5.1)
Alkaline Phosphatase: 69 U/L (ref 33–130)
BILIRUBIN TOTAL: 0.3 mg/dL (ref 0.2–1.2)
BUN: 13 mg/dL (ref 7–25)
CO2: 16 mmol/L — AB (ref 20–31)
CREATININE: 1.37 mg/dL — AB (ref 0.50–1.05)
Calcium: 9.2 mg/dL (ref 8.6–10.4)
Chloride: 99 mmol/L (ref 98–110)
GFR, EST AFRICAN AMERICAN: 50 mL/min — AB (ref >=60)
GFR, EST NON AFRICAN AMERICAN: 43 mL/min — AB (ref >=60)
Glucose, Bld: 80 mg/dL (ref 65–99)
Potassium: 4.7 mmol/L (ref 3.5–5.3)
Sodium: 128 mmol/L — ABNORMAL LOW (ref 135–146)
TOTAL PROTEIN: 8.4 g/dL — AB (ref 6.1–8.1)

## 2016-01-16 MED ORDER — BUSPIRONE HCL 5 MG PO TABS: 5.0000 mg | ORAL_TABLET | Freq: Two times a day (BID) | ORAL | 2 refills | Status: DC

## 2016-01-16 MED ORDER — GABAPENTIN 300 MG PO CAPS: 900.0000 mg | ORAL_CAPSULE | Freq: Two times a day (BID) | ORAL | 2 refills | Status: DC

## 2016-01-16 MED ORDER — TRAMADOL HCL 50 MG PO TABS: 50.0000 mg | ORAL_TABLET | Freq: Two times a day (BID) | ORAL | 2 refills | Status: DC | PRN

## 2016-01-16 MED FILL — ?BUSPIRONE HCL 5 MG TABLET: 5 | 30 days supply | Qty: 60 | Fill #0

## 2016-01-16 MED FILL — traMADol HCL 50 MG TABS: 50 | 30 days supply | Qty: 60 | Fill #0

## 2016-01-16 NOTE — Assessment & Plan Note (Signed)
A: improved P: Repeat CBC and CMP to check Hgb and sodium which if low would contribute to anemia

## 2016-01-16 NOTE — Patient Instructions (Addendum)
[name] was seen today for hospitalization follow-up.  Diagnoses and all orders for this visit:  Leg swelling -     COMPLETE METABOLIC PANEL WITH GFR -     CBC  Chronic musculoskeletal pain -     traMADol (ULTRAM) 50 MG tablet; Take 1 tablet (50 mg total) by mouth every 12 (twelve) hours as needed for moderate pain. -     gabapentin (NEURONTIN) 300 MG capsule; Take 3 capsules (900 mg total) by mouth 2 (two) times daily. -     DG Lumbar Spine 2-3 Views; Future  Chronic anxiety -     busPIRone (BUSPAR) 5 MG tablet; Take 1 tablet (5 mg total) by mouth 2 (two) times daily.    F/u in 3 weeks for anxiety, starting buspar  Dr. Adrian Blackwater

## 2016-01-16 NOTE — Progress Notes (Signed)
Pt is here for hospital follow.(swollen legs)  Pt got flu shot in lobby.
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
## 2016-01-16 NOTE — Assessment & Plan Note (Signed)
A: chronic pain in back and hip P: Lumbar x-ray Refilled tramadol and gabapentin for pain control

## 2016-01-16 NOTE — Assessment & Plan Note (Signed)
A: chronic anxiety in setting of alcohol abuse P: Internal referral to clinical social worker  buspar for anxiety symptoms

## 2016-01-17 ENCOUNTER — Ambulatory Visit (HOSPITAL_COMMUNITY)
Admission: RE | Admit: 2016-01-17 | Discharge: 2016-01-17 | Disposition: A | Discharge status: Home / Self Care | Payer: Self-pay | Source: Ambulatory Visit | Attending: Family Medicine | Admitting: Family Medicine

## 2016-01-17 DIAGNOSIS — M5136 Other intervertebral disc degeneration, lumbar region: Secondary | ICD-10-CM | POA: Insufficient documentation

## 2016-01-17 DIAGNOSIS — M7918 Myalgia, other site: Secondary | ICD-10-CM

## 2016-01-17 DIAGNOSIS — M2578 Osteophyte, vertebrae: Secondary | ICD-10-CM | POA: Insufficient documentation

## 2016-01-17 DIAGNOSIS — G8929 Other chronic pain: Secondary | ICD-10-CM | POA: Insufficient documentation

## 2016-01-17 DIAGNOSIS — M791 Myalgia: Secondary | ICD-10-CM | POA: Insufficient documentation

## 2016-01-17 DIAGNOSIS — M8580 Other specified disorders of bone density and structure, unspecified site: Secondary | ICD-10-CM | POA: Insufficient documentation

## 2016-01-17 DIAGNOSIS — I700 Atherosclerosis of aorta: Secondary | ICD-10-CM | POA: Insufficient documentation

## 2016-01-22 DIAGNOSIS — D509 Iron deficiency anemia, unspecified: Secondary | ICD-10-CM | POA: Insufficient documentation

## 2016-01-22 DIAGNOSIS — E871 Hypo-osmolality and hyponatremia: Secondary | ICD-10-CM | POA: Insufficient documentation

## 2016-01-22 MED ORDER — FERROUS SULFATE 325 (65 FE) MG PO TABS: 325.0000 mg | ORAL_TABLET | Freq: Two times a day (BID) | ORAL | 3 refills | Status: DC

## 2016-01-22 NOTE — Addendum Note (Signed)
Addended by: Boykin Nearing on: 01/22/2016 08:34 PM   Modules accepted: Orders

## 2016-01-24 ENCOUNTER — Telehealth: Payer: Self-pay

## 2016-01-24 NOTE — Telephone Encounter (Signed)
Pt returned phone call for lab results.

## 2016-01-30 ENCOUNTER — Telehealth: Payer: Self-pay | Admitting: Family Medicine

## 2016-01-30 NOTE — Telephone Encounter (Signed)
Called pt. And LVM to let her know that she has Rx ready for pick up at the pharmacy.

## 2016-02-12 ENCOUNTER — Other Ambulatory Visit: Payer: Self-pay | Admitting: Family Medicine

## 2016-02-12 DIAGNOSIS — R062 Wheezing: Secondary | ICD-10-CM

## 2016-02-12 MED FILL — VENTOLIN HFA 90 MCG INHALER: 108 (90 BAS | 25 days supply | Qty: 18 | Fill #0

## 2016-02-12 MED FILL — GABAPENTIN 300 MG CAPSULE: 300 | 30 days supply | Qty: 180 | Fill #0

## 2016-02-12 MED FILL — ?OMEPRAZOLE DR 20 MG CAPSUL: 20 | 30 days supply | Qty: 30 | Fill #5

## 2016-02-12 MED FILL — traZODone HCL 50 MG TABS: 50 | 30 days supply | Qty: 30 | Fill #5

## 2016-02-12 MED FILL — ?LISINOPRIL 20 MG TABLET: 20 | 30 days supply | Qty: 30 | Fill #3

## 2016-02-12 MED FILL — traMADol HCL 50 MG TABS: 50 | 30 days supply | Qty: 60 | Fill #1

## 2016-02-12 MED FILL — ?BUSPIRONE HCL 5 MG TABLET: 5 | 30 days supply | Qty: 60 | Fill #1

## 2016-02-22 ENCOUNTER — Encounter: Payer: Self-pay | Admitting: Family Medicine

## 2016-02-22 ENCOUNTER — Ambulatory Visit: Payer: Self-pay | Attending: Family Medicine | Admitting: Family Medicine

## 2016-02-22 ENCOUNTER — Encounter: Payer: Self-pay | Admitting: Gastroenterology

## 2016-02-22 VITALS — BP 150/78 | HR 93 | Temp 97.6°F | Ht 67.5 in | Wt 143.6 lb

## 2016-02-22 DIAGNOSIS — J301 Allergic rhinitis due to pollen: Secondary | ICD-10-CM

## 2016-02-22 DIAGNOSIS — J302 Other seasonal allergic rhinitis: Secondary | ICD-10-CM | POA: Insufficient documentation

## 2016-02-22 DIAGNOSIS — E1122 Type 2 diabetes mellitus with diabetic chronic kidney disease: Secondary | ICD-10-CM | POA: Insufficient documentation

## 2016-02-22 DIAGNOSIS — B1920 Unspecified viral hepatitis C without hepatic coma: Secondary | ICD-10-CM | POA: Insufficient documentation

## 2016-02-22 DIAGNOSIS — E1322 Other specified diabetes mellitus with diabetic chronic kidney disease: Secondary | ICD-10-CM

## 2016-02-22 DIAGNOSIS — N183 Chronic kidney disease, stage 3 unspecified: Secondary | ICD-10-CM

## 2016-02-22 DIAGNOSIS — F17210 Nicotine dependence, cigarettes, uncomplicated: Secondary | ICD-10-CM | POA: Insufficient documentation

## 2016-02-22 DIAGNOSIS — F419 Anxiety disorder, unspecified: Secondary | ICD-10-CM | POA: Insufficient documentation

## 2016-02-22 DIAGNOSIS — Z590 Homelessness: Secondary | ICD-10-CM | POA: Insufficient documentation

## 2016-02-22 DIAGNOSIS — Z0000 Encounter for general adult medical examination without abnormal findings: Secondary | ICD-10-CM

## 2016-02-22 DIAGNOSIS — I10 Essential (primary) hypertension: Secondary | ICD-10-CM

## 2016-02-22 DIAGNOSIS — F332 Major depressive disorder, recurrent severe without psychotic features: Secondary | ICD-10-CM | POA: Insufficient documentation

## 2016-02-22 DIAGNOSIS — I129 Hypertensive chronic kidney disease with stage 1 through stage 4 chronic kidney disease, or unspecified chronic kidney disease: Secondary | ICD-10-CM | POA: Insufficient documentation

## 2016-02-22 HISTORY — DX: Other specified diabetes mellitus with diabetic chronic kidney disease: E13.22

## 2016-02-22 HISTORY — DX: Allergic rhinitis due to pollen: J30.1

## 2016-02-22 HISTORY — DX: Chronic kidney disease, stage 3 unspecified: N18.30

## 2016-02-22 MED ORDER — LISINOPRIL 40 MG PO TABS: 40.0000 mg | ORAL_TABLET | Freq: Every day | ORAL | 11 refills | Status: DC

## 2016-02-22 MED ORDER — TRAZODONE HCL 50 MG PO TABS: 25.0000 mg | ORAL_TABLET | Freq: Every evening | ORAL | 5 refills | Status: DC | PRN

## 2016-02-22 MED ORDER — CETIRIZINE HCL 10 MG PO CAPS: 10.0000 mg | ORAL_CAPSULE | Freq: Every day | ORAL | 3 refills | Status: DC

## 2016-02-22 MED ORDER — BUSPIRONE HCL 10 MG PO TABS: 10.0000 mg | ORAL_TABLET | Freq: Two times a day (BID) | ORAL | 5 refills | Status: DC
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
## 2016-02-22 NOTE — Assessment & Plan Note (Signed)
Start zyrtec 

## 2016-02-22 NOTE — Patient Instructions (Addendum)
Tylisha was seen today for anxiety.  Diagnoses and all orders for this visit:  Healthcare maintenance -     Ambulatory referral to Gastroenterology  Essential hypertension -     lisinopril (PRINIVIL,ZESTRIL) 40 MG tablet; Take 1 tablet (40 mg total) by mouth daily.  Chronic anxiety -     busPIRone (BUSPAR) 10 MG tablet; Take 1 tablet (10 mg total) by mouth 2 (two) times daily.  Major depressive disorder, recurrent severe without psychotic features ([place]) -     traZODone (DESYREL) 50 MG tablet; Take 0.5-1 tablets (25-50 mg total) by mouth at bedtime as needed. for sleep  Acute seasonal allergic rhinitis due to pollen -     Cetirizine HCl 10 MG CAPS; Take 1 capsule (10 mg total) by mouth daily.  increase Buspar to 10 mg twice daily Increase lisinopril lto 40 mg daily   F/u in  4 weeks for HTN   Dr. Adrian Blackwater

## 2016-02-22 NOTE — Assessment & Plan Note (Signed)
Chronic anxiety Improved with buspar. She is tolerating this well  Increase buspar to 10 mg BID

## 2016-02-22 NOTE — Progress Notes (Signed)
Pt is here today to follow up on anxiety.

## 2016-02-22 NOTE — Assessment & Plan Note (Signed)
Uncontrolled HTN with renal impairment Plan: Increase lisinopril to 40 mg daily Avoid NSAIDs

## 2016-03-13 ENCOUNTER — Other Ambulatory Visit: Payer: Self-pay | Admitting: Family Medicine

## 2016-03-13 DIAGNOSIS — R062 Wheezing: Secondary | ICD-10-CM

## 2016-03-13 MED FILL — ?CETIRIZINE HCL 10 MG TABLE: 10 | 30 days supply | Qty: 30 | Fill #0

## 2016-03-13 MED FILL — busPIRone HCL 10 MG TABS: 10 | 30 days supply | Qty: 60 | Fill #0

## 2016-03-25 MED FILL — VENTOLIN HFA 90 MCG INHALER: 108 (90 BAS | 25 days supply | Qty: 18 | Fill #0

## 2016-03-25 MED FILL — traMADol HCL 50 MG TABS: 50 | 30 days supply | Qty: 60 | Fill #2

## 2016-03-25 MED FILL — LISINOPRIL 40 MG TABLET: 40 | 30 days supply | Qty: 30 | Fill #0

## 2016-03-25 MED FILL — traZODone HCL 50 MG TABS: 50 | 30 days supply | Qty: 30 | Fill #0

## 2016-03-25 MED FILL — GABAPENTIN 300 MG CAPSULE: 300 | 30 days supply | Qty: 180 | Fill #1

## 2016-03-29 ENCOUNTER — Ambulatory Visit (AMBULATORY_SURGERY_CENTER): Payer: Self-pay | Admitting: *Deleted

## 2016-03-29 VITALS — Ht 67.5 in | Wt 144.2 lb

## 2016-03-29 DIAGNOSIS — Z1211 Encounter for screening for malignant neoplasm of colon: Secondary | ICD-10-CM

## 2016-03-29 NOTE — Progress Notes (Signed)
No allergies to eggs or soy. No problems with anesthesia.  Pt not given Emmi instructions for colonoscopy; no computer access  No oxygen use  No diet drug use  

## 2016-04-08 ENCOUNTER — Telehealth: Payer: Self-pay

## 2016-04-08 NOTE — Telephone Encounter (Signed)
Spoke with patient and told her I would leave a Suprep sample up front to be picked up.  Patient agreed.

## 2016-04-10 ENCOUNTER — Ambulatory Visit (AMBULATORY_SURGERY_CENTER): Payer: Self-pay | Admitting: Gastroenterology

## 2016-04-10 ENCOUNTER — Encounter: Payer: Self-pay | Admitting: Gastroenterology

## 2016-04-10 VITALS — BP 129/75 | HR 83 | Temp 96.9°F | Resp 19 | Ht 67.0 in | Wt 144.0 lb

## 2016-04-10 DIAGNOSIS — Z1211 Encounter for screening for malignant neoplasm of colon: Secondary | ICD-10-CM

## 2016-04-10 DIAGNOSIS — D125 Benign neoplasm of sigmoid colon: Secondary | ICD-10-CM

## 2016-04-10 DIAGNOSIS — Z1212 Encounter for screening for malignant neoplasm of rectum: Secondary | ICD-10-CM

## 2016-04-10 MED ORDER — SODIUM CHLORIDE 0.9 % IV SOLN: 500.0000 mL | INTRAVENOUS | Status: DC

## 2016-04-10 NOTE — Patient Instructions (Signed)
YOU HAD AN ENDOSCOPIC PROCEDURE TODAY AT THE [facility]:   Refer to the procedure report that was given to you for any specific questions about what was found during the examination.  If the procedure report does not answer your questions, please call your gastroenterologist to clarify.  If you requested that your care partner not be given the details of your procedure findings, then the procedure report has been included in a sealed envelope for you to review at your convenience later.  YOU SHOULD EXPECT: Some feelings of bloating in the abdomen. Passage of more gas than usual.  Walking can help get rid of the air that was put into your GI tract during the procedure and reduce the bloating. If you had a lower endoscopy (such as a colonoscopy or flexible sigmoidoscopy) you may notice spotting of blood in your stool or on the toilet paper. If you underwent a bowel prep for your procedure, you may not have a normal bowel movement for a few days.  Please Note:  You might notice some irritation and congestion in your nose or some drainage.  This is from the oxygen used during your procedure.  There is no need for concern and it should clear up in a day or so.  SYMPTOMS TO REPORT IMMEDIATELY:   Following lower endoscopy (colonoscopy or flexible sigmoidoscopy):  Excessive amounts of blood in the stool  Significant tenderness or worsening of abdominal pains  Swelling of the abdomen that is new, acute  Fever of 100F or higher    For urgent or emergent issues, a gastroenterologist can be reached at any hour by calling [phone].   DIET:  We do recommend a small meal at first, but then you may proceed to your regular diet.  Drink plenty of fluids but you should avoid alcoholic beverages for 24 hours.  ACTIVITY:  You should plan to take it easy for the rest of today and you should NOT DRIVE or use heavy machinery until tomorrow (because of the sedation medicines used during the test).     FOLLOW UP: Our staff will call the number listed on your records the next business day following your procedure to check on you and address any questions or concerns that you may have regarding the information given to you following your procedure. If we do not reach you, we will leave a message.  However, if you are feeling well and you are not experiencing any problems, there is no need to return our call.  We will assume that you have returned to your regular daily activities without incident.  If any biopsies were taken you will be contacted by phone or by letter within the next 1-3 weeks.  Please call us at [phone] if you have not heard about the biopsies in 3 weeks.    SIGNATURES/CONFIDENTIALITY: You and/or your care partner have signed paperwork which will be entered into your electronic medical record.  These signatures attest to the fact that that the information above on your After Visit Summary has been reviewed and is understood.  Full responsibility of the confidentiality of this discharge information lies with you and/or your care-partner.   Resume medications. Information given on polyps. 

## 2016-04-10 NOTE — Progress Notes (Signed)
Called to room to assist during endoscopic procedure.  Patient ID and intended procedure confirmed with present staff. Received instructions for my participation in the procedure from the performing physician.  

## 2016-04-10 NOTE — Op Note (Signed)
[facility] Patient Name: [name] Procedure Date: 04/10/2016 3:19 PM MRN: [id number] Endoscopist: Mallie Mussel L. Loletha Carrow , MD Age: 56 Referring MD:  Date of Birth: June 27, 1960 Gender: Female Account #: [id number] Procedure:                Colonoscopy Indications:              Screening for colorectal malignant neoplasm, This                            is the patient's first colonoscopy Medicines:                Monitored Anesthesia Care Procedure:                Pre-Anesthesia Assessment:                           - Prior to the procedure, a History and Physical                            was performed, and patient medications and                            allergies were reviewed. The patient's tolerance of                            previous anesthesia was also reviewed. The risks                            and benefits of the procedure and the sedation                            options and risks were discussed with the patient.                            All questions were answered, and informed consent                            was obtained. Prior Anticoagulants: The patient has                            taken no previous anticoagulant or antiplatelet                            agents. ASA Grade Assessment: II - A patient with                            mild systemic disease. After reviewing the risks                            and benefits, the patient was deemed in                            satisfactory condition to undergo the procedure.  After obtaining informed consent, the colonoscope                            was passed under direct vision. Throughout the                            procedure, the patient's blood pressure, pulse, and                            oxygen saturations were monitored continuously. The                            Model CF-HQ190L [id number]) scope was introduced                            through the anus and  advanced to the the cecum,                            identified by appendiceal orifice and ileocecal                            valve. The colonoscopy was performed without                            difficulty. The patient tolerated the procedure                            well. The quality of the bowel preparation was good                            (after lavage). The ileocecal valve, appendiceal                            orifice, and rectum were photographed. The quality                            of the bowel preparation was evaluated using the                            BBPS [facility] Bowel Preparation Scale) with scores                            of: Right Colon = 2, Transverse Colon = 2 and Left                            Colon = 2. The total BBPS score equals 6. Scope In: 3:36:41 PM Scope Out: 3:51:22 PM Scope Withdrawal Time: 0 hours 11 minutes 21 seconds  Total Procedure Duration: 0 hours 14 minutes 41 seconds  Findings:                 The perianal and digital rectal examinations were  normal.                           A 4 mm polyp was found in the mid sigmoid colon.                            The polyp was sessile. The polyp was removed with a                            cold snare. Resection and retrieval were complete.                           The exam was otherwise without abnormality on                            direct and retroflexion views. Complications:            No immediate complications. Estimated Blood Loss:     Estimated blood loss: none. Impression:               - One 4 mm polyp in the mid sigmoid colon, removed                            with a cold snare. Resected and retrieved.                           - The examination was otherwise normal on direct                            and retroflexion views. Recommendation:           - Patient has a contact number available for                            emergencies. The signs and  symptoms of potential                            delayed complications were discussed with the                            patient. Return to normal activities tomorrow.                            Written discharge instructions were provided to the                            patient.                           - Resume previous diet.                           - Continue present medications.                           - Await pathology results.                           -  Repeat colonoscopy is recommended for                            surveillance. The colonoscopy date will be                            determined after pathology results from today's                            exam become available for review. Chairty Toman L. Loletha Carrow, MD 04/10/2016 3:56:13 PM This report has been signed electronically.

## 2016-04-10 NOTE — Progress Notes (Signed)
A and O x3. Report to RN. Tolerated MAC anesthesia well.

## 2016-04-11 ENCOUNTER — Telehealth: Payer: Self-pay | Admitting: *Deleted

## 2016-04-11 NOTE — Telephone Encounter (Signed)
  Follow up Call-  Call back number 04/10/2016  Post procedure Call Back phone  # [phone]  Permission to leave phone message Yes  Some recent data might be hidden     Patient questions:  Do you have a fever, pain , or abdominal swelling? No. Pain Score  0 *  Have you tolerated food without any problems? Yes.    Have you been able to return to your normal activities? Yes.    Do you have any questions about your discharge instructions: Diet   No. Medications  No. Follow up visit  No.  Do you have questions or concerns about your Care? No.  Actions: * If pain score is 4 or above: No action needed, pain <4.

## 2016-04-17 ENCOUNTER — Encounter: Payer: Self-pay | Admitting: Gastroenterology

## 2016-04-18 ENCOUNTER — Encounter: Payer: Self-pay | Admitting: Family Medicine

## 2016-04-24 ENCOUNTER — Other Ambulatory Visit: Payer: Self-pay | Admitting: Family Medicine

## 2016-04-24 DIAGNOSIS — M7918 Myalgia, other site: Principal | ICD-10-CM

## 2016-04-24 DIAGNOSIS — G8929 Other chronic pain: Secondary | ICD-10-CM

## 2016-04-24 DIAGNOSIS — R062 Wheezing: Secondary | ICD-10-CM

## 2016-04-24 DIAGNOSIS — K219 Gastro-esophageal reflux disease without esophagitis: Secondary | ICD-10-CM

## 2016-04-24 MED FILL — ?LISINOPRIL 40 MG TABLET: 40 MG | 30 days supply | Qty: 30 | Fill #1

## 2016-04-24 MED FILL — traZODone HCL 50 MG TABS: 50 | 30 days supply | Qty: 30 | Fill #1

## 2016-04-24 MED FILL — GABAPENTIN 300 MG CAPSULE: 300 | 30 days supply | Qty: 180 | Fill #2

## 2016-04-24 MED FILL — ?CETIRIZINE HCL 10 MG TABLE: 10 | 30 days supply | Qty: 30 | Fill #1

## 2016-04-24 MED FILL — ?BUSPIRONE HCL 10 MG TABLET: 10 | 30 days supply | Qty: 60 | Fill #1

## 2016-04-26 ENCOUNTER — Ambulatory Visit: Payer: Self-pay | Admitting: Family Medicine

## 2016-04-29 ENCOUNTER — Other Ambulatory Visit: Payer: Self-pay | Admitting: Family Medicine

## 2016-04-29 ENCOUNTER — Encounter: Payer: Self-pay | Admitting: Family Medicine

## 2016-04-29 ENCOUNTER — Ambulatory Visit: Payer: Self-pay | Attending: Family Medicine | Admitting: Family Medicine

## 2016-04-29 VITALS — BP 134/72 | HR 90 | Temp 97.7°F | Ht 67.0 in | Wt 144.4 lb

## 2016-04-29 DIAGNOSIS — G8929 Other chronic pain: Secondary | ICD-10-CM | POA: Insufficient documentation

## 2016-04-29 DIAGNOSIS — R87810 Cervical high risk human papillomavirus (HPV) DNA test positive: Secondary | ICD-10-CM

## 2016-04-29 DIAGNOSIS — R062 Wheezing: Secondary | ICD-10-CM | POA: Insufficient documentation

## 2016-04-29 DIAGNOSIS — M7918 Myalgia, other site: Secondary | ICD-10-CM

## 2016-04-29 DIAGNOSIS — F419 Anxiety disorder, unspecified: Secondary | ICD-10-CM | POA: Insufficient documentation

## 2016-04-29 DIAGNOSIS — B182 Chronic viral hepatitis C: Secondary | ICD-10-CM

## 2016-04-29 DIAGNOSIS — F332 Major depressive disorder, recurrent severe without psychotic features: Secondary | ICD-10-CM | POA: Insufficient documentation

## 2016-04-29 DIAGNOSIS — K5900 Constipation, unspecified: Secondary | ICD-10-CM | POA: Insufficient documentation

## 2016-04-29 DIAGNOSIS — K219 Gastro-esophageal reflux disease without esophagitis: Secondary | ICD-10-CM | POA: Insufficient documentation

## 2016-04-29 DIAGNOSIS — N179 Acute kidney failure, unspecified: Secondary | ICD-10-CM

## 2016-04-29 DIAGNOSIS — Z590 Homelessness: Secondary | ICD-10-CM | POA: Insufficient documentation

## 2016-04-29 DIAGNOSIS — F17210 Nicotine dependence, cigarettes, uncomplicated: Secondary | ICD-10-CM | POA: Insufficient documentation

## 2016-04-29 DIAGNOSIS — F1010 Alcohol abuse, uncomplicated: Secondary | ICD-10-CM | POA: Insufficient documentation

## 2016-04-29 DIAGNOSIS — M791 Myalgia: Secondary | ICD-10-CM | POA: Insufficient documentation

## 2016-04-29 DIAGNOSIS — F1410 Cocaine abuse, uncomplicated: Secondary | ICD-10-CM | POA: Insufficient documentation

## 2016-04-29 DIAGNOSIS — I10 Essential (primary) hypertension: Secondary | ICD-10-CM | POA: Insufficient documentation

## 2016-04-29 DIAGNOSIS — E875 Hyperkalemia: Secondary | ICD-10-CM

## 2016-04-29 DIAGNOSIS — D125 Benign neoplasm of sigmoid colon: Secondary | ICD-10-CM | POA: Insufficient documentation

## 2016-04-29 DIAGNOSIS — F17200 Nicotine dependence, unspecified, uncomplicated: Secondary | ICD-10-CM

## 2016-04-29 MED ORDER — BUSPIRONE HCL 10 MG PO TABS: 10.0000 mg | ORAL_TABLET | Freq: Two times a day (BID) | ORAL | 11 refills | Status: DC

## 2016-04-29 MED ORDER — TRAZODONE HCL 50 MG PO TABS: 25.0000 mg | ORAL_TABLET | Freq: Every evening | ORAL | 11 refills | Status: DC | PRN

## 2016-04-29 MED ORDER — ALBUTEROL SULFATE HFA 108 (90 BASE) MCG/ACT IN AERS: 2.0000 | INHALATION_SPRAY | Freq: Four times a day (QID) | RESPIRATORY_TRACT | 11 refills | Status: DC | PRN

## 2016-04-29 MED ORDER — POLYETHYLENE GLYCOL 3350 17 GM/SCOOP PO POWD: 17.0000 g | Freq: Every day | ORAL | 5 refills | Status: DC

## 2016-04-29 MED ORDER — GABAPENTIN 300 MG PO CAPS: 900.0000 mg | ORAL_CAPSULE | Freq: Two times a day (BID) | ORAL | 11 refills | Status: DC

## 2016-04-29 MED ORDER — OMEPRAZOLE 20 MG PO CPDR: 20.0000 mg | DELAYED_RELEASE_CAPSULE | Freq: Every day | ORAL | 11 refills | Status: DC

## 2016-04-29 MED FILL — !VENTOLIN HFA INHALER: 108 (90 BAS | 28 days supply | Qty: 18 | Fill #0

## 2016-04-29 MED FILL — POLYETHYLENE GLYCOL 3350 PO: 30 days supply | Qty: 510 | Fill #0

## 2016-04-29 MED FILL — ?OMEPRAZOLE DR 20 MG CAPSUL: 20 | 30 days supply | Qty: 30 | Fill #0

## 2016-04-29 NOTE — Progress Notes (Signed)
Subjective:  Patient ID: [name], female    DOB: [birth year]  Age: 56 y.o. MRN: [id number]  CC: Hypertension   HPI [name] HTN, hx of cocaine abuse, depression, homelessness, hep C, alcohol abuse,  she  presents for   1. HTN: has ran out of lisinopril. No HA, CP or SOB.   2. Abdominal pains: x 3 months. Pains in lower abdomen. Associated with constipation. No diarrhea, nausea or emesis. She denies alcohol use. She had c-scope on 04/10/2016 that was normal except for 4 mm tubular adenoma in mid sigmoid colon.    Social History  Substance Use Topics  . Smoking status: Current Every Day Smoker    Packs/day: 1.00    Years: 0.00    Types: Cigarettes  . Smokeless tobacco: Never Used  . Alcohol use 4.2 oz/week    7 Cans of beer per week   Outpatient Medications Prior to Visit  Medication Sig Dispense Refill  . albuterol (PROVENTIL HFA;VENTOLIN HFA) 108 (90 Base) MCG/ACT inhaler Inhale 2 puffs into the lungs every 6 (six) hours as needed for wheezing or shortness of breath. 18 g 3  . busPIRone (BUSPAR) 10 MG tablet Take 1 tablet (10 mg total) by mouth 2 (two) times daily. 60 tablet 5  . Cetirizine HCl 10 MG CAPS Take 1 capsule (10 mg total) by mouth daily. 30 capsule 3  . gabapentin (NEURONTIN) 300 MG capsule Take 3 capsules (900 mg total) by mouth 2 (two) times daily. 180 capsule 2  . lisinopril (PRINIVIL,ZESTRIL) 40 MG tablet Take 1 tablet (40 mg total) by mouth daily. 30 tablet 11  . omeprazole (PRILOSEC) 20 MG capsule Take 1 capsule (20 mg total) by mouth daily. 30 capsule 5  . traMADol (ULTRAM) 50 MG tablet Take 1 tablet (50 mg total) by mouth every 12 (twelve) hours as needed for moderate pain. 60 tablet 2  . traZODone (DESYREL) 50 MG tablet Take 0.5-1 tablets (25-50 mg total) by mouth at bedtime as needed. for sleep 30 tablet 5  . VENTOLIN HFA 108 (90 Base) MCG/ACT inhaler INHALE 2 PUFFS INTO THE LUNGS EVERY 6 HOURS AS NEEDED FOR WHEEZING OR SHORTNESS OF BREATH. 18 g 0   . ferrous sulfate 325 (65 FE) MG tablet Take 1 tablet (325 mg total) by mouth 2 (two) times daily with a meal. (Patient not taking: Reported on 04/10/2016) 60 tablet 3   Facility-Administered Medications Prior to Visit  Medication Dose Route Frequency Provider Last Rate Last Dose  . 0.9 %  sodium chloride infusion  500 mL Intravenous Continuous Nelida Meuse III, MD        ROS Review of Systems  Constitutional: Negative for chills and fever.  HENT: Negative for ear pain.   Eyes: Negative for visual disturbance.  Respiratory: Positive for shortness of breath (with exertion ).   Cardiovascular: Negative for chest pain.  Gastrointestinal: Positive for abdominal pain. Negative for blood in stool.  Musculoskeletal: Positive for arthralgias (left arm ), back pain and joint swelling (L foot swelling ).  Skin: Negative for rash.  Allergic/Immunologic: Negative for immunocompromised state.  Neurological: Negative for syncope.  Hematological: Negative for adenopathy. Does not bruise/bleed easily.  Psychiatric/Behavioral: Positive for dysphoric mood and sleep disturbance. Negative for suicidal ideas. The patient is nervous/anxious.    Depression screen [facility] 2/9 04/29/2016 02/22/2016 01/16/2016 04/21/2015 12/22/2014  Decreased Interest 0 0 1 1 (No Data)  Down, Depressed, Hopeless 1 1 2 1  -  PHQ - 2  Score 1 1 3 2  -  Altered sleeping 2 1 2 2  -  Tired, decreased energy 1 1 3 2  -  Change in appetite 1 1 2 2  -  Feeling bad or failure about yourself  1 0 2 1 -  Trouble concentrating 0 0 0 0 -  Moving slowly or fidgety/restless 0 0 0 0 -  Suicidal thoughts 0 0 0 0 -  PHQ-9 Score 6 4 12 9  -   GAD 7 : Generalized Anxiety Score 04/29/2016 02/22/2016 01/16/2016 09/29/2015  Nervous, Anxious, on Edge 1 1 3 3   Control/stop worrying 1 1 2 2   Worry too much - different things 1 1 2 3   Trouble relaxing 1 1 2 2   Restless 0 1 1 2   Easily annoyed or irritable 1 1 2 2   Afraid - awful might happen 0 1 0 1  Total  GAD 7 Score 5 7 12 15      Objective:  BP 134/72 (BP Location: Left Arm, Patient Position: Sitting, Cuff Size: Small)   Pulse 90   Temp 97.7 F (36.5 C) (Oral)   Ht 5\' 7"  (1.702 m)   Wt 144 lb 6.4 oz (65.5 kg)   SpO2 100%   BMI 22.62 kg/m   BP/Weight 04/29/2016 04/10/2016 3/97/6734  Systolic BP 193 790 -  Diastolic BP 72 75 -  Wt. (Lbs) 144.4 144 144.2  BMI 22.62 22.55 22.25  Some encounter information is confidential and restricted. Go to Review Flowsheets activity to see all data.    Physical Exam  Constitutional: She is oriented to person, place, and time. She appears well-developed and well-nourished. No distress.  Thin, female    HENT:  Head: Normocephalic and atraumatic.  Right Ear: Tympanic membrane, external ear and ear canal normal.  Left Ear: Tympanic membrane, external ear and ear canal normal.  Cardiovascular: Normal rate, regular rhythm, normal heart sounds and intact distal pulses.   Pulmonary/Chest: Effort normal and breath sounds normal.  Abdominal: Soft. Bowel sounds are normal. She exhibits no distension and no mass. There is tenderness in the right lower quadrant and left lower quadrant. There is no rebound and no guarding.  Musculoskeletal: She exhibits no edema.  Neurological: She is alert and oriented to person, place, and time.  Skin: Skin is warm and dry. No rash noted.  Psychiatric: She has a normal mood and affect.    Depression screen [facility] 2/9 04/29/2016 02/22/2016 01/16/2016  Decreased Interest 0 0 1  Down, Depressed, Hopeless 1 1 2   PHQ - 2 Score 1 1 3   Altered sleeping 2 1 2   Tired, decreased energy 1 1 3   Change in appetite 1 1 2   Feeling bad or failure about yourself  1 0 2  Trouble concentrating 0 0 0  Moving slowly or fidgety/restless 0 0 0  Suicidal thoughts 0 0 0  PHQ-9 Score 6 4 12    GAD 7 : Generalized Anxiety Score 04/29/2016 02/22/2016 01/16/2016 09/29/2015  Nervous, Anxious, on Edge 1 1 3 3   Control/stop worrying 1 1 2 2   Worry  too much - different things 1 1 2 3   Trouble relaxing 1 1 2 2   Restless 0 1 1 2   Easily annoyed or irritable 1 1 2 2   Afraid - awful might happen 0 1 0 1  Total GAD 7 Score 5 7 12 15      Assessment & Plan:  Kennley was seen today for hypertension.  Diagnoses and all orders for this visit:  Essential hypertension -     Cancel: BASIC METABOLIC PANEL WITH GFR -     COMPLETE METABOLIC PANEL WITH GFR  Gastroesophageal reflux disease, esophagitis presence not specified -     omeprazole (PRILOSEC) 20 MG capsule; Take 1 capsule (20 mg total) by mouth daily.  Chronic anxiety -     busPIRone (BUSPAR) 10 MG tablet; Take 1 tablet (10 mg total) by mouth 2 (two) times daily.  Major depressive disorder, recurrent severe without psychotic features ([place]) -     traZODone (DESYREL) 50 MG tablet; Take 0.5-1 tablets (25-50 mg total) by mouth at bedtime as needed. for sleep  Chronic musculoskeletal pain -     gabapentin (NEURONTIN) 300 MG capsule; Take 3 capsules (900 mg total) by mouth 2 (two) times daily.  Wheezing -     albuterol (PROVENTIL HFA;VENTOLIN HFA) 108 (90 Base) MCG/ACT inhaler; Inhale 2 puffs into the lungs every 6 (six) hours as needed for wheezing or shortness of breath.  Constipation, unspecified constipation type -     polyethylene glycol powder (GLYCOLAX/MIRALAX) powder; Take 17 g by mouth daily.  Pap smear of cervix shows high risk HPV present  Smoking  Chronic hepatitis C without hepatic coma (HCC) -     Hepatitis C antibody, reflex   There are no diagnoses linked to this encounter.  No orders of the defined types were placed in this encounter.   Follow-up: Return in about 2 weeks (around 05/13/2016) for pap smear .   Boykin Nearing MD

## 2016-04-29 NOTE — Progress Notes (Signed)
Pt is here today for HTN. Pt states that she has been out of medication for 1 week.

## 2016-04-29 NOTE — Assessment & Plan Note (Signed)
chronic smoker with SOB Plan: Albuterol Smoking cessation

## 2016-04-29 NOTE — Patient Instructions (Addendum)
[name] was seen today for hypertension.  Diagnoses and all orders for this visit:  Essential hypertension -     Cancel: BASIC METABOLIC PANEL WITH GFR -     COMPLETE METABOLIC PANEL WITH GFR  Gastroesophageal reflux disease, esophagitis presence not specified -     omeprazole (PRILOSEC) 20 MG capsule; Take 1 capsule (20 mg total) by mouth daily.  Chronic anxiety -     busPIRone (BUSPAR) 10 MG tablet; Take 1 tablet (10 mg total) by mouth 2 (two) times daily.  Major depressive disorder, recurrent severe without psychotic features ([place]) -     traZODone (DESYREL) 50 MG tablet; Take 0.5-1 tablets (25-50 mg total) by mouth at bedtime as needed. for sleep  Chronic musculoskeletal pain -     gabapentin (NEURONTIN) 300 MG capsule; Take 3 capsules (900 mg total) by mouth 2 (two) times daily.  Wheezing -     albuterol (PROVENTIL HFA;VENTOLIN HFA) 108 (90 Base) MCG/ACT inhaler; Inhale 2 puffs into the lungs every 6 (six) hours as needed for wheezing or shortness of breath.  Constipation, unspecified constipation type -     polyethylene glycol powder (GLYCOLAX/MIRALAX) powder; Take 17 g by mouth daily.   Start daily miralax to soften stools You colonoscopy was normal except for one single benign polyp (tubular adenoma 23mm). Repeat colonoscopy due in 3 years.   F/u in 2-3 weeks for pap smear  Dr. Adrian Blackwater

## 2016-04-29 NOTE — Assessment & Plan Note (Signed)
HT hx with normal BP today  Med: none Plan Restart lisinopril 40 mg daily

## 2016-04-29 NOTE — Assessment & Plan Note (Signed)
F/u up pap due Patient declined today Will f.u in a few weeks for repeat pap

## 2016-04-30 DIAGNOSIS — N179 Acute kidney failure, unspecified: Secondary | ICD-10-CM | POA: Insufficient documentation

## 2016-04-30 LAB — COMPLETE METABOLIC PANEL WITH GFR
ALT: 15 U/L (ref 6–29)
AST: 32 U/L (ref 10–35)
Albumin: 4 g/dL (ref 3.6–5.1)
Alkaline Phosphatase: 77 U/L (ref 33–130)
BUN: 35 mg/dL — AB (ref 7–25)
CALCIUM: 9.5 mg/dL (ref 8.6–10.4)
CHLORIDE: 104 mmol/L (ref 98–110)
CO2: 12 mmol/L — ABNORMAL LOW (ref 20–31)
Creat: 1.64 mg/dL — ABNORMAL HIGH (ref 0.50–1.05)
GFR, Est African American: 40 mL/min — ABNORMAL LOW (ref >=60)
GFR, Est Non African American: 35 mL/min — ABNORMAL LOW (ref >=60)
Glucose, Bld: 104 mg/dL — ABNORMAL HIGH (ref 65–99)
POTASSIUM: 5.5 mmol/L — AB (ref 3.5–5.3)
Sodium: 131 mmol/L — ABNORMAL LOW (ref 135–146)
Total Bilirubin: 0.2 mg/dL (ref 0.2–1.2)
Total Protein: 7.9 g/dL (ref 6.1–8.1)

## 2016-04-30 LAB — HEPATITIS C ANTIBODY: HCV Ab: REACTIVE — AB

## 2016-04-30 MED ORDER — SODIUM POLYSTYRENE SULFONATE 15 GM/60ML PO SUSP: 15.0000 g | Freq: Once | ORAL | 0 refills | Status: DC

## 2016-04-30 MED ORDER — SODIUM POLYSTYRENE SULFONATE 15 GM/60ML PO SUSP: 15.0000 g | Freq: Once | ORAL | 0 refills | Status: AC

## 2016-04-30 NOTE — Assessment & Plan Note (Signed)
CMP is abnormal There is a metabolic acidosis, high potassium, decline in renal function Patient is to stop lisinopril, no not take Stop any OTC NSAID, aleve, ibuprofen, naproxen etc. Drink plenty of water

## 2016-04-30 NOTE — Addendum Note (Signed)
Addended by: Boykin Nearing on: 04/30/2016 07:51 AM   Modules accepted: Orders

## 2016-05-01 ENCOUNTER — Telehealth: Payer: Self-pay

## 2016-05-01 ENCOUNTER — Ambulatory Visit: Payer: Self-pay

## 2016-05-01 NOTE — Telephone Encounter (Signed)
Pt was called and informed of lab results. Pt would like to know if medication you instructed her to take if she can get it through the pharmacy or OTC.

## 2016-05-02 MED ORDER — SODIUM POLYSTYRENE SULFONATE 15 GM/60ML PO SUSP: 30.0000 g | Freq: Once | ORAL | 0 refills | Status: AC

## 2016-05-02 MED FILL — traMADol HCL 50 MG TABS: 50 | 30 days supply | Qty: 60 | Fill #0

## 2016-05-02 MED FILL — KIONEX 15 GM/60 ML SUS: 15 | 1 days supply | Qty: 120 | Fill #0

## 2016-05-02 NOTE — Addendum Note (Signed)
Addended by: Boykin Nearing on: 05/02/2016 12:24 PM   Modules accepted: Orders

## 2016-05-03 LAB — HEPATITIS C RNA QUANTITATIVE
HCV QUANT LOG: 6.12 {Log_IU}/mL — AB
HCV QUANT: 1330000 [IU]/mL — AB

## 2016-05-06 NOTE — Assessment & Plan Note (Signed)
Persistently elevated Hep C level with high viral load This can cause hepatitis, cirrhosis and liver cancer if untreated ID referral placed for treatment

## 2016-05-06 NOTE — Addendum Note (Signed)
Addended by: Boykin Nearing on: 05/06/2016 09:18 AM   Modules accepted: Orders

## 2016-05-08 ENCOUNTER — Telehealth: Payer: Self-pay

## 2016-05-08 NOTE — Telephone Encounter (Signed)
Pt was called and message was left with boyfriend to have pt to return phone call for lab results.

## 2016-05-13 ENCOUNTER — Other Ambulatory Visit: Payer: Self-pay | Admitting: Family Medicine

## 2016-05-15 ENCOUNTER — Telehealth: Payer: Self-pay

## 2016-05-15 NOTE — Telephone Encounter (Signed)
Pt was called and a message left for her to return phone call for lab results. This has been the second attempt to contact pt.

## 2016-05-21 MED FILL — busPIRone HCL 10 MG TABS: 10 | 30 days supply | Qty: 60 | Fill #2

## 2016-05-21 MED FILL — ?OMEPRAZOLE DR 20 MG CAPSUL: 20 | 30 days supply | Qty: 30 | Fill #1

## 2016-05-21 MED FILL — traZODone HCL 50 MG TABS: 50 | 30 days supply | Qty: 30 | Fill #2

## 2016-05-21 MED FILL — GABAPENTIN 300 MG CAPSULE: 300 | 30 days supply | Qty: 180 | Fill #0

## 2016-05-27 ENCOUNTER — Encounter: Payer: Self-pay | Admitting: Pediatric Intensive Care

## 2016-05-29 MED FILL — traMADol HCL 50 MG TABS: 50 | 30 days supply | Qty: 60 | Fill #1

## 2016-05-29 MED FILL — ?CETIRIZINE HCL 10 MG TABLE: 10 | 30 days supply | Qty: 30 | Fill #2

## 2016-06-07 ENCOUNTER — Encounter (HOSPITAL_COMMUNITY): Payer: Self-pay | Admitting: *Deleted

## 2016-06-07 ENCOUNTER — Emergency Department (HOSPITAL_COMMUNITY): Payer: Self-pay

## 2016-06-07 ENCOUNTER — Observation Stay (HOSPITAL_COMMUNITY)
Admission: EM | Admit: 2016-06-07 | Discharge: 2016-06-08 | Disposition: A | Discharge status: Home / Self Care | Payer: Self-pay | Attending: Internal Medicine | Admitting: Internal Medicine

## 2016-06-07 ENCOUNTER — Encounter: Payer: Self-pay | Admitting: Pediatric Intensive Care

## 2016-06-07 DIAGNOSIS — F32A Depression, unspecified: Secondary | ICD-10-CM | POA: Diagnosis present

## 2016-06-07 DIAGNOSIS — J441 Chronic obstructive pulmonary disease with (acute) exacerbation: Secondary | ICD-10-CM

## 2016-06-07 DIAGNOSIS — J449 Chronic obstructive pulmonary disease, unspecified: Secondary | ICD-10-CM

## 2016-06-07 DIAGNOSIS — F1411 Cocaine abuse, in remission: Secondary | ICD-10-CM

## 2016-06-07 DIAGNOSIS — N183 Chronic kidney disease, stage 3 (moderate): Secondary | ICD-10-CM | POA: Insufficient documentation

## 2016-06-07 DIAGNOSIS — I10 Essential (primary) hypertension: Secondary | ICD-10-CM | POA: Diagnosis present

## 2016-06-07 DIAGNOSIS — F419 Anxiety disorder, unspecified: Secondary | ICD-10-CM

## 2016-06-07 DIAGNOSIS — J45909 Unspecified asthma, uncomplicated: Secondary | ICD-10-CM | POA: Insufficient documentation

## 2016-06-07 DIAGNOSIS — M7989 Other specified soft tissue disorders: Secondary | ICD-10-CM | POA: Diagnosis present

## 2016-06-07 DIAGNOSIS — K219 Gastro-esophageal reflux disease without esophagitis: Secondary | ICD-10-CM

## 2016-06-07 DIAGNOSIS — Z79899 Other long term (current) drug therapy: Secondary | ICD-10-CM | POA: Insufficient documentation

## 2016-06-07 DIAGNOSIS — B1920 Unspecified viral hepatitis C without hepatic coma: Secondary | ICD-10-CM | POA: Diagnosis present

## 2016-06-07 DIAGNOSIS — F17200 Nicotine dependence, unspecified, uncomplicated: Secondary | ICD-10-CM

## 2016-06-07 DIAGNOSIS — Z859 Personal history of malignant neoplasm, unspecified: Secondary | ICD-10-CM | POA: Insufficient documentation

## 2016-06-07 DIAGNOSIS — Z720 Tobacco use: Secondary | ICD-10-CM | POA: Diagnosis present

## 2016-06-07 DIAGNOSIS — F1010 Alcohol abuse, uncomplicated: Secondary | ICD-10-CM | POA: Diagnosis present

## 2016-06-07 DIAGNOSIS — M7918 Myalgia, other site: Secondary | ICD-10-CM

## 2016-06-07 DIAGNOSIS — R0602 Shortness of breath: Secondary | ICD-10-CM | POA: Insufficient documentation

## 2016-06-07 DIAGNOSIS — B182 Chronic viral hepatitis C: Secondary | ICD-10-CM

## 2016-06-07 DIAGNOSIS — D649 Anemia, unspecified: Principal | ICD-10-CM | POA: Insufficient documentation

## 2016-06-07 DIAGNOSIS — N184 Chronic kidney disease, stage 4 (severe): Secondary | ICD-10-CM

## 2016-06-07 DIAGNOSIS — E1122 Type 2 diabetes mellitus with diabetic chronic kidney disease: Secondary | ICD-10-CM | POA: Insufficient documentation

## 2016-06-07 DIAGNOSIS — I129 Hypertensive chronic kidney disease with stage 1 through stage 4 chronic kidney disease, or unspecified chronic kidney disease: Secondary | ICD-10-CM | POA: Insufficient documentation

## 2016-06-07 DIAGNOSIS — F17210 Nicotine dependence, cigarettes, uncomplicated: Secondary | ICD-10-CM | POA: Insufficient documentation

## 2016-06-07 DIAGNOSIS — G8929 Other chronic pain: Secondary | ICD-10-CM

## 2016-06-07 DIAGNOSIS — Z87898 Personal history of other specified conditions: Secondary | ICD-10-CM

## 2016-06-07 DIAGNOSIS — D638 Anemia in other chronic diseases classified elsewhere: Secondary | ICD-10-CM | POA: Diagnosis present

## 2016-06-07 LAB — IRON AND TIBC
IRON: 13 ug/dL — AB (ref 28–170)
Saturation Ratios: 2 % — ABNORMAL LOW (ref 10.4–31.8)
TIBC: 556 ug/dL — AB (ref 250–450)
UIBC: 543 ug/dL

## 2016-06-07 LAB — RETICULOCYTES
RBC.: 2.73 MIL/uL — ABNORMAL LOW (ref 3.87–5.11)
RETIC CT PCT: 1.2 % (ref 0.4–3.1)
Retic Count, Absolute: 32.8 10*3/uL (ref 19.0–186.0)

## 2016-06-07 LAB — CBC
HCT: 21.8 % — ABNORMAL LOW (ref 36.0–46.0)
HEMOGLOBIN: 6.6 g/dL — AB (ref 12.0–15.0)
MCH: 21.7 pg — AB (ref 26.0–34.0)
MCHC: 30.3 g/dL (ref 30.0–36.0)
MCV: 71.7 fL — AB (ref 78.0–100.0)
PLATELETS: 196 10*3/uL (ref 150–400)
RBC: 3.04 MIL/uL — AB (ref 3.87–5.11)
RDW: 19.4 % — ABNORMAL HIGH (ref 11.5–15.5)
WBC: 4.1 10*3/uL (ref 4.0–10.5)

## 2016-06-07 LAB — COMPREHENSIVE METABOLIC PANEL
ALBUMIN: 3.7 g/dL (ref 3.5–5.0)
ALT: 25 U/L (ref 14–54)
ANION GAP: 10 (ref 5–15)
AST: 55 U/L — AB (ref 15–41)
Alkaline Phosphatase: 93 U/L (ref 38–126)
BILIRUBIN TOTAL: 0.5 mg/dL (ref 0.3–1.2)
BUN: 21 mg/dL — AB (ref 6–20)
CO2: 22 mmol/L (ref 22–32)
Calcium: 9.2 mg/dL (ref 8.9–10.3)
Chloride: 103 mmol/L (ref 101–111)
Creatinine, Ser: 1.41 mg/dL — ABNORMAL HIGH (ref 0.44–1.00)
GFR calc Af Amer: 48 mL/min — ABNORMAL LOW (ref >60)
GFR calc non Af Amer: 41 mL/min — ABNORMAL LOW (ref >60)
GLUCOSE: 85 mg/dL (ref 65–99)
POTASSIUM: 4.2 mmol/L (ref 3.5–5.1)
SODIUM: 135 mmol/L (ref 135–145)
Total Protein: 8.6 g/dL — ABNORMAL HIGH (ref 6.5–8.1)

## 2016-06-07 LAB — ABO/RH: ABO/RH(D): A POS

## 2016-06-07 LAB — TROPONIN I: Troponin I: <0.03 ng/mL (ref <0.03)

## 2016-06-07 LAB — FERRITIN: FERRITIN: 8 ng/mL — AB (ref 11–307)

## 2016-06-07 LAB — VITAMIN B12: VITAMIN B 12: 301 pg/mL (ref 180–914)

## 2016-06-07 LAB — PROTIME-INR
INR: 0.98
Prothrombin Time: 13 seconds (ref 11.4–15.2)

## 2016-06-07 LAB — PREPARE RBC (CROSSMATCH)

## 2016-06-07 LAB — I-STAT TROPONIN, ED: TROPONIN I, POC: 0.01 ng/mL (ref 0.00–0.08)

## 2016-06-07 LAB — BRAIN NATRIURETIC PEPTIDE: B Natriuretic Peptide: 168.6 pg/mL — ABNORMAL HIGH (ref 0.0–100.0)

## 2016-06-07 LAB — FOLATE: Folate: 12.6 ng/mL (ref >5.9)

## 2016-06-07 MED ORDER — NICOTINE 21 MG/24HR TD PT24
21.0000 mg | MEDICATED_PATCH | Freq: Every day | TRANSDERMAL | Status: DC
  Administered 2016-06-07 – 2016-06-08 (×2): 21 mg via TRANSDERMAL
  Filled 2016-06-07 (×2): qty 1

## 2016-06-07 MED ORDER — ONDANSETRON HCL 4 MG PO TABS
4.0000 mg | ORAL_TABLET | Freq: Four times a day (QID) | ORAL | Status: DC | PRN
  Administered 2016-06-08: 4 mg via ORAL
  Filled 2016-06-07: qty 1

## 2016-06-07 MED ORDER — BUSPIRONE HCL 10 MG PO TABS
10.0000 mg | ORAL_TABLET | Freq: Two times a day (BID) | ORAL | Status: DC
  Administered 2016-06-07 – 2016-06-08 (×2): 10 mg via ORAL
  Filled 2016-06-07 (×2): qty 1

## 2016-06-07 MED ORDER — CARVEDILOL 3.125 MG PO TABS
3.1250 mg | ORAL_TABLET | Freq: Two times a day (BID) | ORAL | Status: DC
  Administered 2016-06-07 – 2016-06-08 (×2): 3.125 mg via ORAL
  Filled 2016-06-07 (×2): qty 1

## 2016-06-07 MED ORDER — LORAZEPAM 1 MG PO TABS
1.0000 mg | ORAL_TABLET | Freq: Four times a day (QID) | ORAL | Status: DC | PRN
  Administered 2016-06-07 – 2016-06-08 (×2): 1 mg via ORAL
  Filled 2016-06-07 (×2): qty 1

## 2016-06-07 MED ORDER — LORAZEPAM 2 MG/ML IJ SOLN: 1.0000 mg | Freq: Four times a day (QID) | INTRAMUSCULAR | Status: DC | PRN

## 2016-06-07 MED ORDER — TRAZODONE HCL 50 MG PO TABS: 25.0000 mg | ORAL_TABLET | Freq: Every evening | ORAL | Status: DC | PRN

## 2016-06-07 MED ORDER — SODIUM CHLORIDE 0.9% FLUSH
3.0000 mL | Freq: Two times a day (BID) | INTRAVENOUS | Status: DC
  Administered 2016-06-07 – 2016-06-08 (×2): 3 mL via INTRAVENOUS

## 2016-06-07 MED ORDER — THIAMINE HCL 100 MG/ML IJ SOLN: 100.0000 mg | Freq: Every day | INTRAMUSCULAR | Status: DC

## 2016-06-07 MED ORDER — ADULT MULTIVITAMIN W/MINERALS CH
1.0000 | ORAL_TABLET | Freq: Every day | ORAL | Status: DC
  Administered 2016-06-07 – 2016-06-08 (×2): 1 via ORAL
  Filled 2016-06-07 (×2): qty 1

## 2016-06-07 MED ORDER — LORATADINE 10 MG PO TABS
10.0000 mg | ORAL_TABLET | Freq: Every day | ORAL | Status: DC
  Administered 2016-06-07 – 2016-06-08 (×2): 10 mg via ORAL
  Filled 2016-06-07 (×3): qty 1

## 2016-06-07 MED ORDER — GABAPENTIN 300 MG PO CAPS
900.0000 mg | ORAL_CAPSULE | Freq: Two times a day (BID) | ORAL | Status: DC
  Administered 2016-06-07 – 2016-06-08 (×2): 900 mg via ORAL
  Filled 2016-06-07 (×3): qty 3

## 2016-06-07 MED ORDER — PANTOPRAZOLE SODIUM 40 MG PO TBEC
40.0000 mg | DELAYED_RELEASE_TABLET | Freq: Every day | ORAL | Status: DC
  Administered 2016-06-07 – 2016-06-08 (×2): 40 mg via ORAL
  Filled 2016-06-07 (×2): qty 1

## 2016-06-07 MED ORDER — HYDRALAZINE HCL 20 MG/ML IJ SOLN
5.0000 mg | INTRAMUSCULAR | Status: DC | PRN
Start: 2016-06-07 — End: 2016-06-08
  Administered 2016-06-07 – 2016-06-08 (×2): 5 mg via INTRAVENOUS
  Filled 2016-06-07 (×2): qty 1

## 2016-06-07 MED ORDER — NITROGLYCERIN 0.4 MG SL SUBL
0.4000 mg | SUBLINGUAL_TABLET | SUBLINGUAL | Status: DC | PRN
  Administered 2016-06-07: 0.4 mg via SUBLINGUAL
  Filled 2016-06-07: qty 1

## 2016-06-07 MED ORDER — TRAMADOL HCL 50 MG PO TABS
50.0000 mg | ORAL_TABLET | Freq: Two times a day (BID) | ORAL | Status: DC | PRN
  Administered 2016-06-08: 50 mg via ORAL
  Filled 2016-06-07: qty 1

## 2016-06-07 MED ORDER — VITAMIN B-1 100 MG PO TABS
100.0000 mg | ORAL_TABLET | Freq: Every day | ORAL | Status: DC
  Administered 2016-06-07 – 2016-06-08 (×2): 100 mg via ORAL
  Filled 2016-06-07 (×2): qty 1

## 2016-06-07 MED ORDER — ONDANSETRON HCL 4 MG/2ML IJ SOLN: 4.0000 mg | Freq: Four times a day (QID) | INTRAMUSCULAR | Status: DC | PRN

## 2016-06-07 MED ORDER — IPRATROPIUM-ALBUTEROL 0.5-2.5 (3) MG/3ML IN SOLN: 3.0000 mL | RESPIRATORY_TRACT | Status: DC | PRN

## 2016-06-07 MED ORDER — SODIUM CHLORIDE 0.9 % IV SOLN
10.0000 mL/h | Freq: Once | INTRAVENOUS | Status: AC
  Administered 2016-06-07: 10 mL/h via INTRAVENOUS

## 2016-06-07 MED ORDER — ASPIRIN 81 MG PO CHEW
324.0000 mg | CHEWABLE_TABLET | Freq: Once | ORAL | Status: AC
  Administered 2016-06-07: 324 mg via ORAL
  Filled 2016-06-07: qty 4

## 2016-06-07 MED ORDER — FOLIC ACID 1 MG PO TABS
1.0000 mg | ORAL_TABLET | Freq: Every day | ORAL | Status: DC
  Administered 2016-06-07 – 2016-06-08 (×2): 1 mg via ORAL
  Filled 2016-06-07 (×2): qty 1

## 2016-06-07 NOTE — ED Notes (Signed)
Bed: [facility]1 Expected date:  Expected time:  Means of arrival:  Comments: 
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
## 2016-06-07 NOTE — H&P (Signed)
History and Physical    LAVANA HUCKEBA QIH:[id number] DOB: January 14, 1961 DOA: 06/07/2016  I have briefly reviewed the patient's prior medical records in [place]  PCP: Minerva Ends, MD  Patient coming from: home  Chief Complaint: chest pain / dyspnea  HPI: [name] is a 56 y.o. female with medical history significant of alcohol abuse, hypertension, diabetes mellitus, chronic kidney disease stage III, chronic hepatitis C, presents to the emergency room with chief complaint of chest pain and exertional dyspnea over the last 3-4 days.  Patient describes her chest pain as a pressure in the middle of her chest, she is not sure about radiation as she has a history of rheumatoid arthritis and she has chronic shoulder pains and multiple joint pains.  She tells me that she has not had pain like this in the past.  She also complains of generalized weakness, poor energy, as well as shortness of breath and fatigue with minimal exertion.  She has no fever or chills.  She denies any abdominal pain, has had intermittent nausea over the last several days however no vomiting.  She denies any diarrhea.  She denies any blood in her stools, denies any melena.  Over the last couple of days, she has noticed more lower extremity swelling.  She currently is a smoker, she drinks alcohol and is trying to quit.  Last alcoholic drink was about a day ago.  She is established with [facility], and her last visit was in February, and at that time due to hyperkalemia and mild elevation in her creatinine her lisinopril was stopped.  ED Course: In the ED, patient is hypertensive with blood pressures into the 170s-190s, she is satting well on room air, her blood work is pertinent for creatinine 1.4, her BNP is slightly elevated 168, she was found to have a hemoglobin of 6.6 with microcytosis.  TRH is asked for admission for chest pain/antibiotic anemia/dyspnea  Review of Systems: As  per HPI otherwise 10 point review of systems negative.   Past Medical History:  Diagnosis Date  . Alcohol abuse   . Allergy   . Anxiety   . Arthritis   . Asthma   . Cancer ([place])   . Cannabis abuse   . Cocaine abuse   . Depression   . Drug addiction ([place])   . GERD (gastroesophageal reflux disease)   . Homelessness   . Hypertension    pt stated "every once in a while BP will be high but has not been prescribed medication for HTN.   . Seasonal allergies   . Secondary diabetes mellitus with stage 3 chronic kidney disease (GFR 30-59) ([place]) 02/22/2016    Past Surgical History:  Procedure Laterality Date  . CESAREAN SECTION  1989  . FRACTURE SURGERY Left 2011     reports that she has been smoking Cigarettes.  She has been smoking about 1.00 pack per day for the past 0.00 years. She has never used smokeless tobacco. She reports that she drinks about 4.2 oz of alcohol per week . She reports that she uses drugs, including Cocaine and Marijuana.  No Known Allergies  Family History  Problem Relation Age of Onset  . Diabetes Father   . Colon cancer Neg Hx     Prior to Admission medications   Medication Sig Start Date End Date Taking? Authorizing Provider  busPIRone (BUSPAR) 10 MG tablet Take 1 tablet (10 mg total) by mouth 2 (two)  times daily. 04/29/16  Yes Josalyn Funches, MD  Cetirizine HCl 10 MG CAPS Take 1 capsule (10 mg total) by mouth daily. 02/22/16  Yes Josalyn Funches, MD  gabapentin (NEURONTIN) 300 MG capsule Take 3 capsules (900 mg total) by mouth 2 (two) times daily. 04/29/16  Yes Josalyn Funches, MD  omeprazole (PRILOSEC) 20 MG capsule Take 1 capsule (20 mg total) by mouth daily. 04/29/16  Yes Josalyn Funches, MD  polyethylene glycol powder (GLYCOLAX/MIRALAX) powder Take 17 g by mouth daily. Patient taking differently: Take 17 g by mouth daily as needed for mild constipation.  04/29/16  Yes Josalyn Funches, MD  traMADol (ULTRAM) 50 MG tablet TAKE 1 TABLET BY MOUTH EVERY 12  HOURS AS NEEDED FOR MODERATE PAIN. 04/30/16  Yes Boykin Nearing, MD  traZODone (DESYREL) 50 MG tablet Take 0.5-1 tablets (25-50 mg total) by mouth at bedtime as needed. for sleep 04/29/16  Yes Josalyn Funches, MD  VENTOLIN HFA 108 (90 Base) MCG/ACT inhaler INHALE 2 PUFFS INTO THE LUNGS EVERY 6 HOURS AS NEEDED FOR WHEEZING OR SHORTNESS OF BREATH. 04/30/16  Yes Boykin Nearing, MD    Physical Exam: Vitals:   06/07/16 1009 06/07/16 1010 06/07/16 1125 06/07/16 1231  BP: (!) 176/81  (!) 187/89 (!) 182/82  Pulse: 82  88 78  Resp: 16  18 16   Temp: 98.1 F (36.7 C)     TempSrc: Oral     SpO2: 97%  99% 94%  Weight:  67 kg (147 lb 11.2 oz)     Constitutional: NAD, calm, comfortable Vitals:   06/07/16 1009 06/07/16 1010 06/07/16 1125 06/07/16 1231  BP: (!) 176/81  (!) 187/89 (!) 182/82  Pulse: 82  88 78  Resp: 16  18 16   Temp: 98.1 F (36.7 C)     TempSrc: Oral     SpO2: 97%  99% 94%  Weight:  67 kg (147 lb 11.2 oz)     Eyes: PERRL, lids and conjunctivae pale ENMT: Mucous membranes are moist. Posterior pharynx clear of any exudate or lesions.  Neck: normal, supple, no masses Respiratory: Coarse breath sounds bilaterally, overall decreased breath sounds, no wheezing, no crackles Cardiovascular: Regular rate and rhythm, no murmurs / rubs / gallops. 1+ extremity edema. 2+ pedal pulses.  Abdomen: no tenderness, no masses palpated. Bowel sounds positive.  Musculoskeletal: no clubbing / cyanosis. Normal muscle tone.  Skin: no rashes, lesions, ulcers. No induration Neurologic: CN 2-12 grossly intact. Strength 5/5 in all 4.  Psychiatric: Normal judgment and insight. Alert and oriented x 3. Normal mood.   Labs on Admission: I have personally reviewed following labs and imaging studies  CBC:  Recent Labs Lab 06/07/16 1044  WBC 4.1  HGB 6.6*  HCT 21.8*  MCV 71.7*  PLT 628   Basic Metabolic Panel:  Recent Labs Lab 06/07/16 1044  NA 135  K 4.2  CL 103  CO2 22  GLUCOSE 85  BUN 21*   CREATININE 1.41*  CALCIUM 9.2   GFR: Estimated Creatinine Clearance: 43.8 mL/min (A) (by C-G formula based on SCr of 1.41 mg/dL (H)). Liver Function Tests:  Recent Labs Lab 06/07/16 1044  AST 55*  ALT 25  ALKPHOS 93  BILITOT 0.5  PROT 8.6*  ALBUMIN 3.7   No results for input(s): LIPASE, AMYLASE in the last 168 hours. No results for input(s): AMMONIA in the last 168 hours. Coagulation Profile:  Recent Labs Lab 06/07/16 1055  INR 0.98   Cardiac Enzymes: No results for input(s): CKTOTAL, CKMB, CKMBINDEX,  TROPONINI in the last 168 hours. BNP (last 3 results) No results for input(s): PROBNP in the last 8760 hours. HbA1C: No results for input(s): HGBA1C in the last 72 hours. CBG: No results for input(s): GLUCAP in the last 168 hours. Lipid Profile: No results for input(s): CHOL, HDL, LDLCALC, TRIG, CHOLHDL, LDLDIRECT in the last 72 hours. Thyroid Function Tests: No results for input(s): TSH, T4TOTAL, FREET4, T3FREE, THYROIDAB in the last 72 hours. Anemia Panel: No results for input(s): VITAMINB12, FOLATE, FERRITIN, TIBC, IRON, RETICCTPCT in the last 72 hours. Urine analysis:    Component Value Date/Time   COLORURINE STRAW (A) 03/02/2010 2027   APPEARANCEUR CLEAR 03/02/2010 2027   LABSPEC 1.008 03/02/2010 2027   PHURINE 5.0 03/02/2010 2027   GLUCOSEU NEGATIVE 03/02/2010 2027   HGBUR NEGATIVE 03/02/2010 2027   BILIRUBINUR NEGATIVE 03/02/2010 2027   KETONESUR NEGATIVE 03/02/2010 2027   PROTEINUR NEGATIVE 03/02/2010 2027   UROBILINOGEN 0.2 03/02/2010 2027   NITRITE NEGATIVE 03/02/2010 2027   LEUKOCYTESUR  03/02/2010 2027    NEGATIVE MICROSCOPIC NOT DONE ON URINES WITH NEGATIVE PROTEIN, BLOOD, LEUKOCYTES, NITRITE, OR GLUCOSE <1000 mg/dL.     Radiological Exams on Admission: Dg Chest 2 View  Result Date: 06/07/2016 CLINICAL DATA:  Shortness of breath and chest pain for 2 days EXAM: CHEST  2 VIEW COMPARISON:  September 15, 2012 FINDINGS: There is minimal scarring in the  lateral right base. There is no evident edema or consolidation. Heart size and pulmonary vascularity are normal. No adenopathy. There is slight degenerative change in the thoracic spine. There is atherosclerotic calcification in the aorta. IMPRESSION: Minimal scarring lateral right base. No edema or consolidation. There is aortic atherosclerosis. Electronically Signed   By: Lowella Grip III M.D.   On: 06/07/2016 11:06    EKG: Independently reviewed. Sinus rhythm  Assessment/Plan Active Problems:   Hypertension   Chronic hepatitis C without hepatic coma (HCC)   Smoking   Alcohol abuse   History of cocaine abuse   Leg swelling   Chronic anxiety   Symptomatic anemia   CKD (chronic kidney disease), stage III   Symptomatic anemia -No blood in the stool and no melena.  Will obtain a fecal occult.  However suspect her anemia is multifactorial as well as related to ongoing alcohol abuse.  We will check an anemia panel and give vitamins per CIWA protocol.  2 units of packed red blood cells were ordered by the ED physician.  If she has evidence of GI bleed or fecal occult comes back positive will need a gastroenterology consult.  Chest pain/dyspnea on exertion -She definitely has risk factors for coronary artery disease, she is hypertensive, she has positive family history with her dad having heart disease, she has tobacco abuse history and history of rheumatoid arthritis -She has however poorly controlled blood pressure in the ER as well as has anemia, which both can give her chest pain and dyspnea on exertion. -We will obtain a 2D echo, cycle cardiac enzymes for completeness  Hypertension -Poorly controlled, will start patient on Coreg as well as hydralazine as needed, may need scheduled hydralazine if blood pressure remains difficult to control  Tobacco abuse -Counseled for cessation  Alcohol abuse -She minimizes how much she drinks, last alcoholic drink was a day prior to admission,  place patient on CIWA -She has slight elevation of her AST suggesting ongoing alcohol use  Chronic hep C -To be managed as an outpatient  Chronic kidney disease stage III -Creatinine varied between 1.3-1.6  in the last couple years, she is at baseline with a creatinine of 1.4 currently   DVT prophylaxis: SCDs Code Status: DNR Family Communication: No family at bedside Disposition Plan: Admit to telemetry Consults called: None    Admission status: Observation  At the point of initial evaluation, it is my clinical opinion that admission for OBSERVATION is reasonable and necessary because the patient's presenting complaints in the context of their chronic conditions represent sufficient risk of deterioration or significant morbidity to constitute reasonable grounds for close observation in the hospital setting, but that the patient may be medically stable for discharge from the hospital within 24 to 48 hours.   Marzetta Board, MD [facility] Pager [phone]  If 7PM-7AM, please contact night-coverage [email or web address] Password [facility]  06/07/2016, 12:32 PM

## 2016-06-08 ENCOUNTER — Observation Stay (HOSPITAL_BASED_OUTPATIENT_CLINIC_OR_DEPARTMENT_OTHER): Payer: Self-pay

## 2016-06-08 DIAGNOSIS — D649 Anemia, unspecified: Secondary | ICD-10-CM

## 2016-06-08 DIAGNOSIS — M791 Myalgia: Secondary | ICD-10-CM

## 2016-06-08 DIAGNOSIS — K219 Gastro-esophageal reflux disease without esophagitis: Secondary | ICD-10-CM

## 2016-06-08 DIAGNOSIS — J441 Chronic obstructive pulmonary disease with (acute) exacerbation: Secondary | ICD-10-CM

## 2016-06-08 DIAGNOSIS — J449 Chronic obstructive pulmonary disease, unspecified: Secondary | ICD-10-CM

## 2016-06-08 DIAGNOSIS — I509 Heart failure, unspecified: Secondary | ICD-10-CM

## 2016-06-08 DIAGNOSIS — F17200 Nicotine dependence, unspecified, uncomplicated: Secondary | ICD-10-CM

## 2016-06-08 DIAGNOSIS — G8929 Other chronic pain: Secondary | ICD-10-CM

## 2016-06-08 LAB — CBC WITH DIFFERENTIAL/PLATELET
Basophils Absolute: 0 10*3/uL (ref 0.0–0.1)
Basophils Relative: 1 %
EOS ABS: 0.2 10*3/uL (ref 0.0–0.7)
EOS PCT: 5 %
HCT: 27.6 % — ABNORMAL LOW (ref 36.0–46.0)
Hemoglobin: 8.7 g/dL — ABNORMAL LOW (ref 12.0–15.0)
LYMPHS ABS: 1.1 10*3/uL (ref 0.7–4.0)
Lymphocytes Relative: 28 %
MCH: 23.1 pg — AB (ref 26.0–34.0)
MCHC: 31.5 g/dL (ref 30.0–36.0)
MCV: 73.4 fL — ABNORMAL LOW (ref 78.0–100.0)
MONO ABS: 0.5 10*3/uL (ref 0.1–1.0)
MONOS PCT: 12 %
Neutro Abs: 2 10*3/uL (ref 1.7–7.7)
Neutrophils Relative %: 54 %
Platelets: 170 10*3/uL (ref 150–400)
RBC: 3.76 MIL/uL — ABNORMAL LOW (ref 3.87–5.11)
RDW: 18.9 % — AB (ref 11.5–15.5)
WBC: 3.8 10*3/uL — ABNORMAL LOW (ref 4.0–10.5)

## 2016-06-08 LAB — ECHOCARDIOGRAM COMPLETE
Height: 67 in
Weight: 2401.6 oz

## 2016-06-08 LAB — TROPONIN I: Troponin I: <0.03 ng/mL (ref <0.03)

## 2016-06-08 LAB — HIV ANTIBODY (ROUTINE TESTING W REFLEX): HIV SCREEN 4TH GENERATION: NONREACTIVE

## 2016-06-08 MED ORDER — VITAMIN B-12 1000 MCG PO TABS
1000.0000 ug | ORAL_TABLET | Freq: Every day | ORAL | Status: DC
  Administered 2016-06-08: 1000 ug via ORAL
  Filled 2016-06-08 (×2): qty 1

## 2016-06-08 MED ORDER — CYANOCOBALAMIN 1000 MCG PO TABS: 1000.0000 ug | ORAL_TABLET | Freq: Every day | ORAL | 1 refills | Status: DC

## 2016-06-08 MED ORDER — POLYSACCHARIDE IRON COMPLEX 150 MG PO CAPS
150.0000 mg | ORAL_CAPSULE | Freq: Two times a day (BID) | ORAL | Status: DC
  Administered 2016-06-08: 150 mg via ORAL
  Filled 2016-06-08: qty 1

## 2016-06-08 MED ORDER — OMEPRAZOLE 20 MG PO CPDR: 20.0000 mg | DELAYED_RELEASE_CAPSULE | Freq: Two times a day (BID) | ORAL | 1 refills | Status: DC

## 2016-06-08 MED ORDER — POLYSACCHARIDE IRON COMPLEX 150 MG PO CAPS: 150.0000 mg | ORAL_CAPSULE | Freq: Two times a day (BID) | ORAL | 1 refills | Status: DC

## 2016-06-08 MED ORDER — GABAPENTIN 300 MG PO CAPS: 300.0000 mg | ORAL_CAPSULE | Freq: Three times a day (TID) | ORAL | 0 refills | Status: DC

## 2016-06-08 MED ORDER — NICOTINE 21 MG/24HR TD PT24: 21.0000 mg | MEDICATED_PATCH | Freq: Every day | TRANSDERMAL | 0 refills | Status: DC

## 2016-06-08 MED ORDER — ISOSORBIDE MONONITRATE ER 30 MG PO TB24: 30.0000 mg | ORAL_TABLET | Freq: Every day | ORAL | 1 refills | Status: DC

## 2016-06-08 MED ORDER — CARVEDILOL 3.125 MG PO TABS: 3.1250 mg | ORAL_TABLET | Freq: Two times a day (BID) | ORAL | 1 refills | Status: DC

## 2016-06-08 NOTE — Progress Notes (Addendum)
Patient discharged - all discharge instructions discussed with patient, patient verbalizes understanding of all. 6 prescriptions given to patient. Pt to call Monday to schedule F/U appt with PCP. IV Access removed. All belongings sent with patient at time of discharge. Patient discharged in stable condition, in no distress, with stable vitals. Patient taken via wheelchair by NT to main entrace, discharge destination homeless shelter, patient reports she has her own personal bus voucher to provide transport back to shelter. Pt does state that she feels safe at the shelter.

## 2016-06-08 NOTE — Discharge Summary (Signed)
Physician Discharge Summary  [name] RWE:[id number] DOB: Nov 02, 1960 DOA: 06/07/2016  PCP: Minerva Ends, MD  Admit date: 06/07/2016 Discharge date: 06/08/2016  Time spent: 35 minutes  Recommendations for Outpatient Follow-up:  1. Repeat CBC to follow Hgb trend  2. Repeat CMET to follow electrolytes, renal function and LFT's 3. 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 Patient discharge home with instructions to follow up with PCP in 2 weeks.  Diet recommendation: heart healthy diet  Filed Weights   06/07/16 1010 06/07/16 1428  Weight: 67 kg (147 lb 11.2 oz) 68.1 kg (150 lb 1.6 oz)    History of present illness:  56 y.o. female with medical history significant of alcohol abuse, hypertension, diabetes mellitus, chronic kidney disease stage III, chronic hepatitis C, presents to the emergency room with chief complaint of chest pain and exertional dyspnea over the last 3-4 days.  Patient describes her chest pain as a pressure in the middle of her chest, she is not sure about radiation as she has a history of rheumatoid arthritis and she has chronic shoulder pains and multiple joint pains.  She tells me that she has not had pain like this in the past.  She also complains of generalized weakness, poor energy, as well as shortness of breath and fatigue with minimal exertion.  She has no fever or chills.  She denies any abdominal pain, has had intermittent nausea over the last several days however no vomiting.  She denies any diarrhea.  She denies any blood in her  stools, denies any melena.  Over the last couple of days, she has noticed more lower extremity swelling.  She currently is a smoker, she drinks alcohol and is trying to quit.  Last alcoholic drink was about a day ago.  She is established with [facility], and her last visit was in February, and at that time due to hyperkalemia and mild elevation in her creatinine her lisinopril was stopped.  Hospital Course:  Symptomatic anemia -No blood in the stool and no melena.   -neg FOBT -most likely anemia of chronic disease, with component of suppression from alcohol usage  -2 units of PRBC's transfused -Hgb above 8 at discharge -patient started on B12 and iron supplementation at discharge   Chest pain/dyspnea on exertion -heart score 4 -no acute ischemic changes on EKG or telemetry -neg troponin X 3 -2-D echo reassuring (preserved EF and no wall motion abnormalities) -most likely secondary to uncontrolled HTN and anemia -discharge on antihypertensive regimen -advise to quit drinking and to stop smoking -patient was transfused and discharge on iron supplementation -no CP or SOB at discharge.  GERD -discharge on PPI -advise to quit alcohol consumption and tobacco abuse  Hypertension -Poorly controlled -patient discharge on low dose coreg and imdur -advise to follow low sodium diet   Tobacco abuse -Counseled for cessation -nicotine patch prescribed   Alcohol abuse -She minimizes how much she drinks -placed on CIWA protocol while inpatient -no withdrawal appreciated -cessation counseling provided -discharge on B12, thiamine  and folic acid supplementation (last two through use of MV)  Chronic hep C -To be managed as an outpatient -patient advisee to stop alcohol consumption  -repeat LFT's at follow up to follow liver function   Chronic kidney disease stage III -Creatinine varied between 1.3-1.6 in the last couple years, she is at baseline  with a creatinine of 1.4 currently -advise to follow low sodium diet -minimize use of nephrotoxic agents -follow up renal function trend at follow up visit.  Procedures:  See below for x-ray reports   2-D echo -Left ventricle: The cavity size was normal. Wall thickness was   normal. Systolic function was normal. The estimated ejection   fraction was in the range of 60% to 65%. Wall motion was normal;   there were no regional wall motion abnormalities. Left   ventricular diastolic function parameters were normal. - Aortic valve: Valve area (VTI): 2.06 cm^2. Valve area (Vmax):   1.87 cm^2. Valve area (Vmean): 2.02 cm^2. - Atrial septum: No defect or patent foramen ovale was identified. - Technically adequate study.  Consultations:  None   Discharge Exam: Vitals:   06/08/16 0559 06/08/16 0800  BP: (!) 177/99   Pulse: 79 80  Resp: 20   Temp: 98.5 F (36.9 C)     General: afebrile, no CP and no SOB. Patient feeling better overall. Cardiovascular: S1 and S2; no JVD, no rubs, no gallops, no murmurs  Respiratory: scattered rhonchi, no wheezing, good air movement  abd: soft, NT, ND, positive BS Extremities: no edema or cyanosis appreciated.  Discharge Instructions   Discharge Instructions    Diet - low sodium heart healthy    Complete by:  As directed    Discharge instructions    Complete by:  As directed    Follow low sodium diet (less than 2.5 daily) Take medications as prescribed Stop alcohol and tobacco use Keep yourself well hydrated  Arrange follow up with PCP in 2 weeks     Current Discharge Medication List    START taking these medications   Details  carvedilol (COREG) 3.125 MG tablet Take 1 tablet (3.125 mg total) by mouth 2 (two) times daily with a meal. Qty: 60 tablet, Refills: 1    iron polysaccharides (NIFEREX) 150 MG capsule Take 1 capsule (150 mg total) by mouth 2 (two) times daily. Qty: 60 capsule, Refills: 1    isosorbide mononitrate (IMDUR)  30 MG 24 hr tablet Take 1 tablet (30 mg total) by mouth daily. Qty: 30 tablet, Refills: 1    nicotine (NICODERM CQ - DOSED IN MG/24 HOURS) 21 mg/24hr patch Place 1 patch (21 mg total) onto the skin daily. Qty: 28 patch, Refills: 0    vitamin B-12 1000 MCG tablet Take 1 tablet (1,000 mcg total) by mouth daily. Qty: 30 tablet, Refills: 1      CONTINUE these medications which have CHANGED   Details  gabapentin (NEURONTIN) 300 MG capsule Take 1 capsule (300 mg total) by mouth 3 (three) times daily. Qty: 30 capsule, Refills: 0   Associated Diagnoses: Chronic musculoskeletal pain    omeprazole (PRILOSEC) 20 MG capsule Take 1 capsule (20 mg total) by mouth 2 (two) times daily before a meal. Qty: 60 capsule, Refills: 1   Associated Diagnoses: Gastroesophageal reflux disease, esophagitis presence not specified      CONTINUE these medications which have NOT CHANGED   Details  busPIRone (BUSPAR) 10 MG tablet Take 1 tablet (10 mg total) by mouth 2 (two) times daily. Qty:  60 tablet, Refills: 11   Associated Diagnoses: Chronic anxiety    Cetirizine HCl 10 MG CAPS Take 1 capsule (10 mg total) by mouth daily. Qty: 30 capsule, Refills: 3   Associated Diagnoses: Acute seasonal allergic rhinitis due to pollen    polyethylene glycol powder (GLYCOLAX/MIRALAX) powder Take 17 g by mouth daily. Qty: 3350 g, Refills: 5   Associated Diagnoses: Constipation, unspecified constipation type    traMADol (ULTRAM) 50 MG tablet TAKE 1 TABLET BY MOUTH EVERY 12 HOURS AS NEEDED FOR MODERATE PAIN. Qty: 60 tablet, Refills: 2   Associated Diagnoses: Chronic musculoskeletal pain    traZODone (DESYREL) 50 MG tablet Take 0.5-1 tablets (25-50 mg total) by mouth at bedtime as needed. for sleep Qty: 30 tablet, Refills: 11   Associated Diagnoses: Major depressive disorder, recurrent severe without psychotic features (HCC)    VENTOLIN HFA 108 (90 Base) MCG/ACT inhaler INHALE 2 PUFFS INTO THE LUNGS EVERY 6 HOURS AS NEEDED  FOR WHEEZING OR SHORTNESS OF BREATH. Qty: 18 g, Refills: 0   Associated Diagnoses: Wheezing       No Known Allergies Follow-up Information    Minerva Ends, MD. Schedule an appointment as soon as possible for a visit in 2 week(s).   Specialty:  Family Medicine Contact information: [place] [address] [phone]            The results of significant diagnostics from this hospitalization (including imaging, microbiology, ancillary and laboratory) are listed below for reference.    Significant Diagnostic Studies: Dg Chest 2 View  Result Date: 06/07/2016 CLINICAL DATA:  Shortness of breath and chest pain for 2 days EXAM: CHEST  2 VIEW COMPARISON:  September 15, 2012 FINDINGS: There is minimal scarring in the lateral right base. There is no evident edema or consolidation. Heart size and pulmonary vascularity are normal. No adenopathy. There is slight degenerative change in the thoracic spine. There is atherosclerotic calcification in the aorta. IMPRESSION: Minimal scarring lateral right base. No edema or consolidation. There is aortic atherosclerosis. Electronically Signed   By: Lowella Grip III M.D.   On: 06/07/2016 11:06    Microbiology: No results found for this or any previous visit (from the past 240 hour(s)).   Labs: Basic Metabolic Panel:  Recent Labs Lab 06/07/16 1044  NA 135  K 4.2  CL 103  CO2 22  GLUCOSE 85  BUN 21*  CREATININE 1.41*  CALCIUM 9.2   Liver Function Tests:  Recent Labs Lab 06/07/16 1044  AST 55*  ALT 25  ALKPHOS 93  BILITOT 0.5  PROT 8.6*  ALBUMIN 3.7   No results for input(s): LIPASE, AMYLASE in the last 168 hours. No results for input(s): AMMONIA in the last 168 hours. CBC:  Recent Labs Lab 06/07/16 1044 06/08/16 0542  WBC 4.1 3.8*  NEUTROABS  --  2.0  HGB 6.6* 8.7*  HCT 21.8* 27.6*  MCV 71.7* 73.4*  PLT 196 170   Cardiac Enzymes:  Recent Labs Lab 06/07/16 1349 06/08/16 0032 06/08/16 0545   TROPONINI <0.03 <0.03 <0.03   BNP: BNP (last 3 results)  Recent Labs  06/07/16 1044  BNP 168.6*    Signed:  Barton Dubois MD.  [facility] 06/08/2016, 10:30 AM

## 2016-06-08 NOTE — Progress Notes (Signed)
  Echocardiogram 2D Echocardiogram has been performed.  [name] 06/08/2016, 10:32 AM
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
## 2016-06-10 ENCOUNTER — Encounter: Payer: Self-pay | Admitting: Pediatric Intensive Care

## 2016-06-10 LAB — BPAM RBC
BLOOD PRODUCT EXPIRATION DATE: 201804162359
Blood Product Expiration Date: 201804162359
ISSUE DATE / TIME: 201803231427
ISSUE DATE / TIME: 201803231739
UNIT TYPE AND RH: 6200
Unit Type and Rh: 6200

## 2016-06-10 LAB — TYPE AND SCREEN
ABO/RH(D): A POS
Antibody Screen: NEGATIVE
Unit division: 0
Unit division: 0

## 2016-06-10 MED FILL — OMEPRAZOLE DR 20 MG CAPSULE: 20 | 30 days supply | Qty: 60 | Fill #0

## 2016-06-10 MED FILL — CARVEDILOL 3.125 MG TABLET: 3.125 | 30 days supply | Qty: 60 | Fill #0

## 2016-06-10 MED FILL — ISOSORBIDE MN ER 30 MG TAB: 30 | 30 days supply | Qty: 30 | Fill #0

## 2016-06-11 ENCOUNTER — Ambulatory Visit: Payer: Self-pay | Attending: Family Medicine | Admitting: Physician Assistant

## 2016-06-11 VITALS — BP 186/87 | HR 71 | Temp 97.5°F | Wt 149.8 lb

## 2016-06-11 DIAGNOSIS — I10 Essential (primary) hypertension: Secondary | ICD-10-CM

## 2016-06-11 DIAGNOSIS — N183 Chronic kidney disease, stage 3 (moderate): Secondary | ICD-10-CM | POA: Insufficient documentation

## 2016-06-11 DIAGNOSIS — F17200 Nicotine dependence, unspecified, uncomplicated: Secondary | ICD-10-CM

## 2016-06-11 DIAGNOSIS — Z09 Encounter for follow-up examination after completed treatment for conditions other than malignant neoplasm: Secondary | ICD-10-CM | POA: Insufficient documentation

## 2016-06-11 DIAGNOSIS — F329 Major depressive disorder, single episode, unspecified: Secondary | ICD-10-CM | POA: Insufficient documentation

## 2016-06-11 DIAGNOSIS — B1920 Unspecified viral hepatitis C without hepatic coma: Secondary | ICD-10-CM | POA: Insufficient documentation

## 2016-06-11 DIAGNOSIS — D649 Anemia, unspecified: Secondary | ICD-10-CM

## 2016-06-11 DIAGNOSIS — D6489 Other specified anemias: Secondary | ICD-10-CM | POA: Insufficient documentation

## 2016-06-11 DIAGNOSIS — F1410 Cocaine abuse, uncomplicated: Secondary | ICD-10-CM | POA: Insufficient documentation

## 2016-06-11 DIAGNOSIS — F1010 Alcohol abuse, uncomplicated: Secondary | ICD-10-CM | POA: Insufficient documentation

## 2016-06-11 DIAGNOSIS — I129 Hypertensive chronic kidney disease with stage 1 through stage 4 chronic kidney disease, or unspecified chronic kidney disease: Secondary | ICD-10-CM | POA: Insufficient documentation

## 2016-06-11 MED ORDER — HYDRALAZINE HCL 25 MG PO TABS: 25.0000 mg | ORAL_TABLET | Freq: Two times a day (BID) | ORAL | 1 refills | Status: DC

## 2016-06-11 MED ORDER — NICOTINE 14 MG/24HR TD PT24: 14.0000 mg | MEDICATED_PATCH | Freq: Every day | TRANSDERMAL | 0 refills | Status: DC

## 2016-06-11 MED ORDER — CARVEDILOL 6.25 MG PO TABS: 6.2500 mg | ORAL_TABLET | Freq: Two times a day (BID) | ORAL | 1 refills | Status: DC

## 2016-06-11 MED FILL — ?CARVEDILOL 6.25 MG TABLET: 6.25 | 30 days supply | Qty: 60 | Fill #0

## 2016-06-11 NOTE — Patient Instructions (Signed)
Ok to increase activity as tolerated from tomorrow with rest as needed Keep legs elevated when sitting pls see new meds Nicotine patch ordered RTC in 1 week

## 2016-06-11 NOTE — Progress Notes (Signed)
[name]  DTO:[id number]  XIP:[id number]  DOB - 08-18-1960  Chief Complaint  Patient presents with  . Hospitalization Follow-up       Subjective:   [name] is a 56 y.o. female here today for hospital follow up. She has a history of hypertension, smoking, cocaine abuse, alcohol abuse, hep C, depression, chronic kidney disease stage III. She was hospitalized 06/07/2016-06/08/2016 with chest pain, shortness of breath, fatigue for 3-4 days prior to arrival. She also had noticed swelling in bilateral lower extremities. She did not have any blood in her stool. On presentation her blood pressure was elevated. Her creatinine was 1.4. Her peptide was 168. Her hemoglobin was 6.6 with microcytosis. EKG was normal. Echocardiogram normal. The internal medicine team and admitted her. Her Hemoccult was negative for blood. She status post 2 units. She was not seen by GI. Her hemoglobin was 8.7 at discharge. She was started on B12 and iron supplementation. She was started on a PPI.  Since discharge she's feeling much better. Her breathing has improved. No chest pain. The swelling has improved. Still no blood in her stool. She did start taking the PPI but has not started taking the iron or B12. She lives in a shelter. She continues to smoke but wishes to quit. She has not consumed alcohol since discharge. She has been compliant with her blood pressure medications.    ROS: GEN: denies fever or chills, denies change in weight Skin: denies lesions or rashes HEENT: denies headache, earache, epistaxis, sore throat, or neck pain LUNGS: denies SHOB, dyspnea, PND, orthopnea CV: denies CP or palpitations ABD: denies abd pain, N or V EXT: denies muscle spasms or swelling; no pain in lower ext, no weakness NEURO: denies numbness or tingling, denies sz, stroke or TIA  ALLERGIES: No Known Allergies  PAST MEDICAL HISTORY: Past Medical History:  Diagnosis Date  . Alcohol abuse   . Allergy   . Anxiety    . Arthritis   . Asthma   . Cancer ([place])   . Cannabis abuse   . Cocaine abuse   . Depression   . Drug addiction ([place])   . GERD (gastroesophageal reflux disease)   . Homelessness   . Hypertension    pt stated "every once in a while BP will be high but has not been prescribed medication for HTN.   . Seasonal allergies   . Secondary diabetes mellitus with stage 3 chronic kidney disease (GFR 30-59) ([place]) 02/22/2016    PAST SURGICAL HISTORY: Past Surgical History:  Procedure Laterality Date  . CESAREAN SECTION  1989  . FRACTURE SURGERY Left 2011    MEDICATIONS AT HOME: Prior to Admission medications   Medication Sig Start Date End Date Taking? Authorizing Provider  busPIRone (BUSPAR) 10 MG tablet Take 1 tablet (10 mg total) by mouth 2 (two) times daily. 04/29/16  Yes Boykin Nearing, MD  carvedilol (COREG) 6.25 MG tablet Take 1 tablet (6.25 mg total) by mouth 2 (two) times daily with a meal. 06/11/16  Yes Joaquin Knebel Daneil Dan, PA-C  Cetirizine HCl 10 MG CAPS Take 1 capsule (10 mg total) by mouth daily. 02/22/16  Yes Josalyn Funches, MD  gabapentin (NEURONTIN) 300 MG capsule Take 1 capsule (300 mg total) by mouth 3 (three) times daily. 06/08/16  Yes [facility], MD  iron polysaccharides (NIFEREX) 150 MG capsule Take 1 capsule (150 mg total) by mouth 2 (two) times daily. 06/08/16  Yes [facility], MD  isosorbide mononitrate (IMDUR) 30 MG 24 hr  tablet Take 1 tablet (30 mg total) by mouth daily. 06/08/16  Yes [facility], MD  nicotine (NICODERM CQ - DOSED IN MG/24 HOURS) 21 mg/24hr patch Place 1 patch (21 mg total) onto the skin daily. 06/09/16  Yes [facility], MD  omeprazole (PRILOSEC) 20 MG capsule Take 1 capsule (20 mg total) by mouth 2 (two) times daily before a meal. 06/08/16  Yes [facility], MD  polyethylene glycol powder (GLYCOLAX/MIRALAX) powder Take 17 g by mouth daily. Patient taking differently: Take 17 g by mouth daily as needed for mild constipation.  04/29/16  Yes Josalyn  Funches, MD  traMADol (ULTRAM) 50 MG tablet TAKE 1 TABLET BY MOUTH EVERY 12 HOURS AS NEEDED FOR MODERATE PAIN. 04/30/16  Yes Boykin Nearing, MD  traZODone (DESYREL) 50 MG tablet Take 0.5-1 tablets (25-50 mg total) by mouth at bedtime as needed. for sleep 04/29/16  Yes Josalyn Funches, MD  VENTOLIN HFA 108 (90 Base) MCG/ACT inhaler INHALE 2 PUFFS INTO THE LUNGS EVERY 6 HOURS AS NEEDED FOR WHEEZING OR SHORTNESS OF BREATH. 04/30/16  Yes Josalyn Funches, MD  vitamin B-12 1000 MCG tablet Take 1 tablet (1,000 mcg total) by mouth daily. 06/09/16  Yes [facility], MD  hydrALAZINE (APRESOLINE) 25 MG tablet Take 1 tablet (25 mg total) by mouth 2 (two) times daily. 06/11/16   Ascher Schroepfer Daneil Dan, PA-C  nicotine (NICODERM CQ - DOSED IN MG/24 HOURS) 14 mg/24hr patch Place 1 patch (14 mg total) onto the skin daily. 06/11/16   Brayton Caves, PA-C    Family History  Problem Relation Age of Onset  . Diabetes Father   . Colon cancer Neg Hx     @SOCIALHX @  Objective:   Vitals:   06/11/16 0926  BP: (!) 186/87  Pulse: 71  Temp: 97.5 F (36.4 C)  TempSrc: Oral  SpO2: 100%  Weight: 149 lb 12.8 oz (67.9 kg)    Exam General appearance : Awake, alert, not in any distress. Speech Clear. Not toxic looking HEENT: Atraumatic and Normocephalic, pupils equally reactive to light and accomodation. No teeth. Neck: supple, no JVD. No cervical lymphadenopathy.  Chest:Good air entry bilaterally, no added sounds  CVS: S1 S2 regular, 2/6 murmurs.  Abdomen: Bowel sounds present, Non tender and not distended with no guarding, rigidity or rebound. Extremities: B/L Lower Ext shows 1+ edema, both legs are warm to touch Neurology: Awake alert, and oriented X 3, CN II-XII intact, Non focal Skin:No Rash Wounds:N/A  Data Review Lab Results  Component Value Date   HGBA1C 5.20 04/21/2015     Assessment & Plan  1. Acute on chronic anemia likely of chronic dz  -start FESO4 and B12  -PPI  -recheck hgb 1 week 2.  Smoker  -Nicotine patches  3. HTN-not controlled  -Increase Coreg  -Add Hydralazine  -low salt diet encouraged 4. CKD 3 5. Hep C/Alcohol abuse   Return in about 1 week (around 06/18/2016). w/ CBC.  The patient was given clear instructions to go to ER or return to medical center if symptoms don't improve, worsen or new problems develop. The patient verbalized understanding. The patient was told to call to get lab results if they haven't heard anything in the next week.   Total time spent with patient was 17 min. Greater than 50 % of this visit was spent face to face counseling and coordinating care regarding risk factor modification, compliance importance and encouragement, education related to disease process and follow up.  This note has been created  with Surveyor, quantity. Any transcriptional errors are unintentional.    Zettie Pho, PA-C [facility], [place]   06/11/2016, 9:59 AM

## 2016-06-13 ENCOUNTER — Telehealth: Payer: Self-pay

## 2016-06-13 NOTE — Telephone Encounter (Signed)
Pt was called and a message was left for pt to return phone call for lab results. 

## 2016-06-17 ENCOUNTER — Encounter: Payer: Self-pay | Admitting: Pediatric Intensive Care

## 2016-06-19 MED FILL — traZODone HCL 50 MG TABS: 50 | 30 days supply | Qty: 30 | Fill #3

## 2016-06-19 MED FILL — GABAPENTIN 300 MG CAPSULE: 300 | 30 days supply | Qty: 180 | Fill #1

## 2016-06-19 MED FILL — hydrALAZINE HCL 25 MG TABS: 25 | 30 days supply | Qty: 60 | Fill #0

## 2016-06-22 NOTE — Congregational Nurse Program (Signed)
Congregational Nurse Program Note  Date of Encounter: 05/27/2016  Past Medical History: Past Medical History:  Diagnosis Date  . Alcohol abuse   . Allergy   . Anxiety   . Arthritis   . Asthma   . Cancer ([place])   . Cannabis abuse   . Cocaine abuse   . Depression   . Drug addiction ([place])   . GERD (gastroesophageal reflux disease)   . Homelessness   . Hypertension    pt stated "every once in a while BP will be high but has not been prescribed medication for HTN.   . Seasonal allergies   . 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 New   Patient is considered a/an Not Applicable   Race Caucasian/White     Patient Assistance   Location of Patient Assistance [facility]   Patient's financial/insurance status [facility]   Uninsured Patient ([facility] Card/Care Connects) Yes   Interventions Counseled to make appt. with provider   Patient referred to apply for the following financial assistance Medicaid   Food insecurities addressed Not Applicable   Transportation assistance No   Assistance securing medications No   Educational health offerings Navigating the healthcare system;Chronic disease;Hypertension     Encounter Details   Primary purpose of visit Chronic Illness/Condition Visit;Navigating the Healthcare System   Was an Emergency Department visit averted? Yes   Does patient have a medical provider? Yes   Patient referred to Follow up with established PCP   Was a mental health screening completed? (GAINS tool) No   Does patient have dental issues? No   Does your patient have an abnormal blood pressure today? Yes   Since previous encounter, have you referred patient for abnormal blood pressure that resulted in a new diagnosis or medication change? No   Does your patient have an abnormal blood  glucose today? No   Since previous encounter, have you referred patient for abnormal blood glucose that resulted in a new diagnosis or medication change? No   Was there a life-saving intervention made? No         Clinical Intake - 06/11/16 4193      Pre-visit preparation   Pre-visit preparation completed Yes     Pain   Pain  No/denies pain     Nutrition Screen   Diabetes No     Functional Status   Activities of Daily Living Independent   Ambulation Independent   Medication Administration Independent   Home Management Independent     Risk/Barriers   Barriers to Care Management & Learning None     Abuse/Neglect   Do you feel unsafe in your current relationship? No   Do you feel physically threatened by others? No   Anyone hurting you at home, work, or school? No   Unable to ask? No     Investment banker, operational Needed? No     Client is patient at [facility]. Needs follow up appointment as she has been taken off blood pressure medication due to hyperkalemia. Client has completed course of kayexalate. CN unable to get follow up appointment at present as all [facility] providers are booked. Client will call back on 3/26 to make appointment and follow up with CN for BP checks.

## 2016-06-24 NOTE — Congregational Nurse Program (Signed)
Congregational Nurse Program Note  Date of Encounter: 06/07/2016  Past Medical History: Past Medical History:  Diagnosis Date  . Alcohol abuse   . Allergy   . Anxiety   . Arthritis   . Asthma   . Cancer ([place])   . Cannabis abuse   . Cocaine abuse   . Depression   . Drug addiction ([place])   . GERD (gastroesophageal reflux disease)   . Homelessness   . Hypertension    pt stated "every once in a while BP will be high but has not been prescribed medication for HTN.   . Seasonal allergies   . Secondary diabetes mellitus with stage 3 chronic kidney disease (GFR 30-59) ([place]) 02/22/2016    Encounter Details:     CNP Questionnaire - 06/07/16 0900      Patient Demographics   Is this a new or existing patient? Existing   Patient is considered a/an Not Applicable   Race Caucasian/White     Patient Assistance   Location of Patient Assistance [facility]   Patient's financial/insurance status [facility]   Uninsured Patient ([facility] Card/Care Connects) Yes   Interventions Assisted patient in making appt.;Counseled to make appt. with provider   Patient referred to apply for the following financial assistance Medicaid   Food insecurities addressed Not Applicable   Transportation assistance Yes   Type of Assistance Bus Pass Given   Assistance securing medications No   Educational health offerings Not Applicable     Encounter Details   Primary purpose of visit Acute Illness/Condition Visit   Was an Emergency Department visit averted? No   Does patient have a medical provider? Yes   Patient referred to Emergency Department   Was a mental health screening completed? (GAINS tool) No   Does patient have dental issues? No   Does patient have vision issues? No   Does your patient have an abnormal blood pressure today? Yes   Since previous encounter, have you referred patient for abnormal blood pressure that resulted in a new diagnosis or medication change? No   Does your patient  have an abnormal blood glucose today? No   Since previous encounter, have you referred patient for abnormal blood glucose that resulted in a new diagnosis or medication change? No   Was there a life-saving intervention made? No         Clinical Intake - 06/11/16 6213      Pre-visit preparation   Pre-visit preparation completed Yes     Pain   Pain  No/denies pain     Nutrition Screen   Diabetes No     Functional Status   Activities of Daily Living Independent   Ambulation Independent   Medication Administration Independent   Home Management Independent     Risk/Barriers   Barriers to Care Management & Learning None     Abuse/Neglect   Do you feel unsafe in your current relationship? No   Do you feel physically threatened by others? No   Anyone hurting you at home, work, or school? No   Unable to ask? No     Investment banker, operational Needed? No     Client states she has a headache with 10/10 pain and 10/10 leg pain. Client has 3+ lower extremity edema to knees. Pitting. Given bus passes to get to [facility].

## 2016-06-24 NOTE — Congregational Nurse Program (Signed)
Congregational Nurse Program Note  Date of Encounter: 06/10/2016  Past Medical History: Past Medical History:  Diagnosis Date  . Alcohol abuse   . Allergy   . Anxiety   . Arthritis   . Asthma   . Cancer ([place])   . Cannabis abuse   . Cocaine abuse   . Depression   . Drug addiction ([place])   . GERD (gastroesophageal reflux disease)   . Homelessness   . Hypertension    pt stated "every once in a while BP will be high but has not been prescribed medication for HTN.   . Seasonal allergies   . Secondary diabetes mellitus with stage 3 chronic kidney disease (GFR 30-59) ([place]) 02/22/2016    Encounter Details:     CNP Questionnaire - 06/10/16 0900      Patient Demographics   Is this a new or existing patient? Existing   Patient is considered a/an Not Applicable   Race Caucasian/White     Patient Assistance   Location of Patient Assistance [facility]   Patient's financial/insurance status [facility]   Uninsured Patient ([facility] Card/Care Connects) Yes   Interventions Not Applicable   Patient referred to apply for the following financial assistance Not Applicable   Food insecurities addressed Not Applicable   Transportation assistance Yes   Type of Assistance Bus Pass Given   Assistance securing medications Yes   Type of Assistance [facility]   Educational health offerings Navigating the healthcare system     Encounter Details   Primary purpose of visit Chronic Illness/Condition Visit;Post ED/Hospitalization Visit   Was an Emergency Department visit averted? Not Applicable   Does patient have a medical provider? Yes   Patient referred to Follow up with established PCP   Was a mental health screening completed? (GAINS tool) No   Does patient have dental issues? No   Does patient have vision issues? No   Does your patient have an abnormal blood pressure today? Yes   Since previous encounter, have you referred patient for abnormal blood pressure that resulted in a  new diagnosis or medication change? Yes   Does your patient have an abnormal blood glucose today? No   Since previous encounter, have you referred patient for abnormal blood glucose that resulted in a new diagnosis or medication change? No   Was there a life-saving intervention made? No         Clinical Intake - 06/11/16 7001      Pre-visit preparation   Pre-visit preparation completed Yes     Pain   Pain  No/denies pain     Nutrition Screen   Diabetes No     Functional Status   Activities of Daily Living Independent   Ambulation Independent   Medication Administration Independent   Home Management Independent     Risk/Barriers   Barriers to Care Management & Learning None     Abuse/Neglect   Do you feel unsafe in your current relationship? No   Do you feel physically threatened by others? No   Anyone hurting you at home, work, or school? No   Unable to ask? No     Investment banker, operational Needed? No     Follow up post hospitalization. CN assisted client with PCP appointment. CN will pick up medication for client. Client to follow up with PCP tomorrow.

## 2016-06-27 MED FILL — ?CETIRIZINE HCL 10 MG TABLE: 10 | 30 days supply | Qty: 30 | Fill #3

## 2016-06-27 MED FILL — traMADol HCL 50 MG TABS: 50 | 30 days supply | Qty: 60 | Fill #2

## 2016-06-27 MED FILL — VENTOLIN HFA 90 MCG INHALER: 108 (90 BAS | 25 days supply | Qty: 18 | Fill #0

## 2016-07-08 NOTE — Congregational Nurse Program (Signed)
Congregational Nurse Program Note  Date of Encounter: 06/17/2016  Past Medical History: Past Medical History:  Diagnosis Date  . Alcohol abuse   . Allergy   . Anxiety   . Arthritis   . Asthma   . Cancer ([place])   . Cannabis abuse   . Cocaine abuse   . Depression   . Drug addiction ([place])   . GERD (gastroesophageal reflux disease)   . Homelessness   . Hypertension    pt stated "every once in a while BP will be high but has not been prescribed medication for HTN.   . Seasonal allergies   . Secondary diabetes mellitus with stage 3 chronic kidney disease (GFR 30-59) ([place]) 02/22/2016    Encounter Details:     CNP Questionnaire - 06/17/16 0915      Patient Demographics   Is this a new or existing patient? New   Patient is considered a/an Not Applicable   Race Caucasian/White     Patient Assistance   Location of Patient Assistance [facility]   Patient's financial/insurance status [facility]   Uninsured Patient ([facility] Card/Care Connects) Yes   Interventions Follow-up/Education/Support provided after completed appt.   Patient referred to apply for the following financial assistance Not Applicable   Food insecurities addressed Not Applicable   Transportation assistance Yes   Type of Assistance Bus Pass Given   Assistance securing medications No   Type of Assistance Other   Educational health offerings Hypertension     Encounter Details   Primary purpose of visit Chronic Illness/Condition Visit;Post ED/Hospitalization Visit   Was an Emergency Department visit averted? Not Applicable   Does patient have a medical provider? Yes   Patient referred to Follow up with established PCP   Was a mental health screening completed? (GAINS tool) No   Does patient have dental issues? No   Does patient have vision issues? No   Does your patient have an abnormal blood pressure today? Yes   Since previous encounter, have you referred patient for abnormal blood pressure that resulted  in a new diagnosis or medication change? No   Does your patient have an abnormal blood glucose today? No   Since previous encounter, have you referred patient for abnormal blood glucose that resulted in a new diagnosis or medication change? No   Was there a life-saving intervention made? No         Clinical Intake - 06/11/16 6734      Pre-visit preparation   Pre-visit preparation completed Yes     Pain   Pain  No/denies pain     Nutrition Screen   Diabetes No     Functional Status   Activities of Daily Living Independent   Ambulation Independent   Medication Administration Independent   Home Management Independent     Risk/Barriers   Barriers to Care Management & Learning None     Abuse/Neglect   Do you feel unsafe in your current relationship? No   Do you feel physically threatened by others? No   Anyone hurting you at home, work, or school? No   Unable to ask? No     Investment banker, operational Needed? No     BP check- client has follow up appointment at [facility] this morning and needs bus passes.

## 2016-07-09 ENCOUNTER — Ambulatory Visit: Payer: Self-pay | Admitting: Family Medicine

## 2016-07-11 ENCOUNTER — Encounter: Payer: Self-pay | Admitting: Family Medicine

## 2016-07-11 ENCOUNTER — Ambulatory Visit: Payer: Self-pay | Attending: Family Medicine | Admitting: Family Medicine

## 2016-07-11 VITALS — BP 140/82 | HR 82 | Temp 98.0°F | Ht 67.0 in | Wt 137.2 lb

## 2016-07-11 DIAGNOSIS — F1410 Cocaine abuse, uncomplicated: Secondary | ICD-10-CM | POA: Insufficient documentation

## 2016-07-11 DIAGNOSIS — F17210 Nicotine dependence, cigarettes, uncomplicated: Secondary | ICD-10-CM | POA: Insufficient documentation

## 2016-07-11 DIAGNOSIS — D509 Iron deficiency anemia, unspecified: Secondary | ICD-10-CM

## 2016-07-11 DIAGNOSIS — F1010 Alcohol abuse, uncomplicated: Secondary | ICD-10-CM | POA: Insufficient documentation

## 2016-07-11 DIAGNOSIS — F329 Major depressive disorder, single episode, unspecified: Secondary | ICD-10-CM | POA: Insufficient documentation

## 2016-07-11 DIAGNOSIS — I10 Essential (primary) hypertension: Secondary | ICD-10-CM

## 2016-07-11 DIAGNOSIS — M7989 Other specified soft tissue disorders: Secondary | ICD-10-CM | POA: Insufficient documentation

## 2016-07-11 DIAGNOSIS — Z590 Homelessness: Secondary | ICD-10-CM | POA: Insufficient documentation

## 2016-07-11 DIAGNOSIS — R87810 Cervical high risk human papillomavirus (HPV) DNA test positive: Secondary | ICD-10-CM

## 2016-07-11 MED ORDER — CYANOCOBALAMIN 1000 MCG PO TABS: 1000.0000 ug | ORAL_TABLET | Freq: Every day | ORAL | 2 refills | Status: DC

## 2016-07-11 MED ORDER — FERROUS SULFATE 325 (65 FE) MG PO TABS: 325.0000 mg | ORAL_TABLET | Freq: Two times a day (BID) | ORAL | 3 refills | Status: DC

## 2016-07-11 NOTE — Patient Instructions (Addendum)
[name] was seen today for anemia.  Diagnoses and all orders for this visit:  Essential hypertension -     BMP8+EGFR  Iron deficiency anemia, unspecified iron deficiency anemia type -     CBC -     ferrous sulfate 325 (65 FE) MG tablet; Take 1 tablet (325 mg total) by mouth 2 (two) times daily with a meal. -     cyanocobalamin 1000 MCG tablet; Take 1 tablet (1,000 mcg total) by mouth daily.   f/u in 2 weeks for pharmacy BP check  f/u with me in 6 weeks for HTN  Dr. Adrian Blackwater

## 2016-07-11 NOTE — Progress Notes (Signed)
Subjective:  Patient ID: [name], female    DOB: [birth year]  Age: 56 y.o. MRN: [id number]  CC: Anemia   HPI [name] HTN, hx of cocaine abuse, depression, homelessness currently living in a shelter, hep C, alcohol abuse,  she  presents for   1. HTN: no medication today. She lives in a shelter. She took all of her medication yesterday. She denies HA, CP or SOB.   2. Anemia: she is not taking iron. She denies blood in stool. She denies abdominal pain and alcohol use. Reports leg swelling has improved.    Social History  Substance Use Topics  . Smoking status: Current Every Day Smoker    Packs/day: 1.00    Years: 0.00    Types: Cigarettes  . Smokeless tobacco: Never Used  . Alcohol use 4.2 oz/week    7 Cans of beer per week   Outpatient Medications Prior to Visit  Medication Sig Dispense Refill  . busPIRone (BUSPAR) 10 MG tablet Take 1 tablet (10 mg total) by mouth 2 (two) times daily. 60 tablet 11  . carvedilol (COREG) 6.25 MG tablet Take 1 tablet (6.25 mg total) by mouth 2 (two) times daily with a meal. 60 tablet 1  . Cetirizine HCl 10 MG CAPS Take 1 capsule (10 mg total) by mouth daily. 30 capsule 3  . gabapentin (NEURONTIN) 300 MG capsule Take 1 capsule (300 mg total) by mouth 3 (three) times daily. 30 capsule 0  . hydrALAZINE (APRESOLINE) 25 MG tablet Take 1 tablet (25 mg total) by mouth 2 (two) times daily. 60 tablet 1  . iron polysaccharides (NIFEREX) 150 MG capsule Take 1 capsule (150 mg total) by mouth 2 (two) times daily. 60 capsule 1  . isosorbide mononitrate (IMDUR) 30 MG 24 hr tablet Take 1 tablet (30 mg total) by mouth daily. 30 tablet 1  . omeprazole (PRILOSEC) 20 MG capsule Take 1 capsule (20 mg total) by mouth 2 (two) times daily before a meal. 60 capsule 1  . polyethylene glycol powder (GLYCOLAX/MIRALAX) powder Take 17 g by mouth daily. (Patient taking differently: Take 17 g by mouth daily as needed for mild constipation. ) 3350 g 5  . traMADol  (ULTRAM) 50 MG tablet TAKE 1 TABLET BY MOUTH EVERY 12 HOURS AS NEEDED FOR MODERATE PAIN. 60 tablet 2  . traZODone (DESYREL) 50 MG tablet Take 0.5-1 tablets (25-50 mg total) by mouth at bedtime as needed. for sleep 30 tablet 11  . VENTOLIN HFA 108 (90 Base) MCG/ACT inhaler INHALE 2 PUFFS INTO THE LUNGS EVERY 6 HOURS AS NEEDED FOR WHEEZING OR SHORTNESS OF BREATH. 18 g 0  . vitamin B-12 1000 MCG tablet Take 1 tablet (1,000 mcg total) by mouth daily. 30 tablet 1  . nicotine (NICODERM CQ - DOSED IN MG/24 HOURS) 14 mg/24hr patch Place 1 patch (14 mg total) onto the skin daily. (Patient not taking: Reported on 07/11/2016) 28 patch 0  . nicotine (NICODERM CQ - DOSED IN MG/24 HOURS) 21 mg/24hr patch Place 1 patch (21 mg total) onto the skin daily. (Patient not taking: Reported on 07/11/2016) 28 patch 0   No facility-administered medications prior to visit.     ROS Review of Systems  Constitutional: Negative for chills and fever.  HENT: Negative for ear pain.   Eyes: Negative for visual disturbance.  Respiratory: Positive for shortness of breath (with exertion ).   Cardiovascular: Positive for chest pain.  Gastrointestinal: Positive for abdominal pain. Negative for blood in  stool.  Musculoskeletal: Positive for arthralgias (left arm ), back pain and joint swelling (L foot swelling ).  Skin: Negative for rash.  Allergic/Immunologic: Negative for immunocompromised state.  Neurological: Positive for dizziness. Negative for syncope.  Hematological: Negative for adenopathy. Does not bruise/bleed easily.  Psychiatric/Behavioral: Positive for dysphoric mood and sleep disturbance. Negative for suicidal ideas. The patient is nervous/anxious.    Depression screen [facility] 2/9 07/11/2016 06/11/2016 04/29/2016 02/22/2016 01/16/2016  Decreased Interest 1 2 0 0 1  Down, Depressed, Hopeless 1 0 1 1 2   PHQ - 2 Score 2 2 1 1 3   Altered sleeping 2 2 2 1 2   Tired, decreased energy 2 3 1 1 3   Change in appetite 2 0 1 1 2    Feeling bad or failure about yourself  1 0 1 0 2  Trouble concentrating 0 0 0 0 0  Moving slowly or fidgety/restless 0 0 0 0 0  Suicidal thoughts 0 0 0 0 0  PHQ-9 Score 9 7 6 4 12   Some recent data might be hidden   GAD 7 : Generalized Anxiety Score 07/11/2016 06/11/2016 04/29/2016 02/22/2016  Nervous, Anxious, on Edge 2 2 1 1   Control/stop worrying 1 3 1 1   Worry too much - different things 1 3 1 1   Trouble relaxing 0 2 1 1   Restless 0 1 0 1  Easily annoyed or irritable 1 2 1 1   Afraid - awful might happen 0 0 0 1  Total GAD 7 Score 5 13 5 7      Objective:  BP (!) 186/80   Pulse 82   Temp 98 F (36.7 C) (Oral)   Ht 5' 7"  (1.702 m)   Wt 137 lb 3.2 oz (62.2 kg)   SpO2 100%   BMI 21.49 kg/m   BP/Weight 07/11/2016 06/17/2016 7/61/4709  Systolic BP 295 747 340  Diastolic BP 82 78 87  Wt. (Lbs) 137.2 - 149.8  BMI 21.49 - 23.46  Some encounter information is confidential and restricted. Go to Review Flowsheets activity to see all data.    Physical Exam  Constitutional: She is oriented to person, place, and time. She appears well-developed and well-nourished. No distress.  Thin, female    HENT:  Head: Normocephalic and atraumatic.  Right Ear: Tympanic membrane, external ear and ear canal normal.  Left Ear: Tympanic membrane, external ear and ear canal normal.  Cardiovascular: Normal rate, regular rhythm, normal heart sounds and intact distal pulses.   Pulmonary/Chest: Effort normal and breath sounds normal.  Abdominal: Soft. Bowel sounds are normal.  Musculoskeletal: She exhibits no edema.  Neurological: She is alert and oriented to person, place, and time.  Skin: Skin is warm and dry. No rash noted.  Psychiatric: She has a normal mood and affect.    CBC Latest Ref Rng & Units 07/11/2016 06/08/2016 06/07/2016  WBC 3.4 - 10.8 x10E3/uL 4.9 3.8(L) 4.1  Hemoglobin 12.0 - 15.0 g/dL - 8.7(L) 6.6(LL)  Hematocrit 34.0 - 46.6 % 31.6(L) 27.6(L) 21.8(L)  Platelets 150 - 379 x10E3/uL  200 170 196    Assessment & Plan:  Aroura was seen today for anemia.  Diagnoses and all orders for this visit:  Essential hypertension -     BMP8+EGFR  Iron deficiency anemia, unspecified iron deficiency anemia type -     CBC -     ferrous sulfate 325 (65 FE) MG tablet; Take 1 tablet (325 mg total) by mouth 2 (two) times daily with a meal. -  cyanocobalamin 1000 MCG tablet; Take 1 tablet (1,000 mcg total) by mouth daily.   There are no diagnoses linked to this encounter.  No orders of the defined types were placed in this encounter.   Follow-up: Return in about 6 weeks (around 08/22/2016) for HTN and pap smear.   Boykin Nearing MD

## 2016-07-12 LAB — CBC
HEMOGLOBIN: 9.6 g/dL — AB (ref 11.1–15.9)
Hematocrit: 31.6 % — ABNORMAL LOW (ref 34.0–46.6)
MCH: 23.6 pg — AB (ref 26.6–33.0)
MCHC: 30.4 g/dL — ABNORMAL LOW (ref 31.5–35.7)
MCV: 78 fL — ABNORMAL LOW (ref 79–97)
PLATELETS: 200 10*3/uL (ref 150–379)
RBC: 4.06 x10E6/uL (ref 3.77–5.28)
RDW: 23.4 % — ABNORMAL HIGH (ref 12.3–15.4)
WBC: 4.9 10*3/uL (ref 3.4–10.8)

## 2016-07-16 NOTE — Assessment & Plan Note (Signed)
Due for repeat pap smear Will obtain at follow up

## 2016-07-16 NOTE — Assessment & Plan Note (Addendum)
A: chronic HTN P: Continue current regimen of  Coreg 6.25 mg BID Hydralazine 25 mg BID Imdur 30 mg daily

## 2016-07-16 NOTE — Assessment & Plan Note (Signed)
Anemia has improved Patient to start oral iron therapy

## 2016-07-17 ENCOUNTER — Telehealth: Payer: Self-pay

## 2016-07-17 NOTE — Telephone Encounter (Signed)
Pt is no longer at this number.

## 2016-07-24 ENCOUNTER — Other Ambulatory Visit: Payer: Self-pay | Admitting: Family Medicine

## 2016-07-24 DIAGNOSIS — Z1231 Encounter for screening mammogram for malignant neoplasm of breast: Secondary | ICD-10-CM

## 2016-07-24 MED FILL — OMEPRAZOLE DR 20 MG CAPSULE: 20 | 30 days supply | Qty: 30 | Fill #2

## 2016-07-24 MED FILL — ?CARVEDILOL 6.25 MG TABLET: 6.25 | 30 days supply | Qty: 60 | Fill #1

## 2016-07-24 MED FILL — FERROUS SULFATE 325 MG TAB: 325 (65 FE) | 30 days supply | Qty: 60 | Fill #0

## 2016-07-24 MED FILL — GABAPENTIN 300 MG CAPSULE: 300 | 30 days supply | Qty: 180 | Fill #2

## 2016-07-24 MED FILL — hydrALAZINE HCL 25 MG TABS: 25 | 30 days supply | Qty: 60 | Fill #1

## 2016-07-24 MED FILL — traZODone HCL 50 MG TABS: 50 | 30 days supply | Qty: 30 | Fill #4

## 2016-07-30 MED FILL — busPIRone HCL 10 MG TABS: 10 | 30 days supply | Qty: 60 | Fill #3

## 2016-07-31 ENCOUNTER — Other Ambulatory Visit: Payer: Self-pay | Admitting: Family Medicine

## 2016-07-31 ENCOUNTER — Encounter: Payer: Self-pay | Admitting: Family Medicine

## 2016-07-31 ENCOUNTER — Telehealth: Payer: Self-pay

## 2016-07-31 ENCOUNTER — Ambulatory Visit: Payer: Self-pay | Attending: Family Medicine

## 2016-07-31 DIAGNOSIS — M7918 Myalgia, other site: Principal | ICD-10-CM

## 2016-07-31 DIAGNOSIS — G8929 Other chronic pain: Secondary | ICD-10-CM

## 2016-08-01 NOTE — Telephone Encounter (Signed)
  Reviewed [facility] Controlled Substance Reporting System. No unauthorized fills of controlled medications  Tramadol rx ready for pick up

## 2016-08-01 NOTE — Telephone Encounter (Signed)
Error

## 2016-08-05 MED FILL — traMADol HCL 50 MG TABS: 50 | 30 days supply | Qty: 60 | Fill #0

## 2016-08-26 ENCOUNTER — Other Ambulatory Visit: Payer: Self-pay | Admitting: Physician Assistant

## 2016-08-26 ENCOUNTER — Other Ambulatory Visit: Payer: Self-pay | Admitting: Family Medicine

## 2016-08-26 DIAGNOSIS — J301 Allergic rhinitis due to pollen: Secondary | ICD-10-CM

## 2016-08-26 DIAGNOSIS — F332 Major depressive disorder, recurrent severe without psychotic features: Secondary | ICD-10-CM

## 2016-08-26 MED FILL — ?BUSPIRONE HCL 10 MG TABLET: 10 | 30 days supply | Qty: 60 | Fill #4

## 2016-08-26 MED FILL — ?CETIRIZINE HCL 10 MG TABLE: 10 | 30 days supply | Qty: 30 | Fill #0

## 2016-08-26 MED FILL — CARVEDILOL 6.25 MG TABLET: 6.25 | 30 days supply | Qty: 60 | Fill #0

## 2016-08-26 MED FILL — GABAPENTIN 300 MG CAPSULE: 300 | 30 days supply | Qty: 180 | Fill #3

## 2016-08-26 MED FILL — hydrALAZINE HCL 25 MG TABS: 25 | 30 days supply | Qty: 60 | Fill #0

## 2016-08-26 MED FILL — ?OMEPRAZOLE DR 20 MG CAPSUL: 20 | 30 days supply | Qty: 30 | Fill #3

## 2016-08-26 MED FILL — ?TRAZODONE 50 MG TABLET: 50 | 30 days supply | Qty: 30 | Fill #5

## 2016-09-03 ENCOUNTER — Ambulatory Visit: Payer: Self-pay | Attending: Family Medicine | Admitting: Family Medicine

## 2016-09-03 ENCOUNTER — Encounter: Payer: Self-pay | Admitting: Family Medicine

## 2016-09-03 VITALS — BP 125/72 | HR 90 | Temp 98.2°F | Wt 140.8 lb

## 2016-09-03 DIAGNOSIS — R05 Cough: Secondary | ICD-10-CM | POA: Insufficient documentation

## 2016-09-03 DIAGNOSIS — H60502 Unspecified acute noninfective otitis externa, left ear: Secondary | ICD-10-CM | POA: Insufficient documentation

## 2016-09-03 DIAGNOSIS — F1410 Cocaine abuse, uncomplicated: Secondary | ICD-10-CM | POA: Insufficient documentation

## 2016-09-03 DIAGNOSIS — M79604 Pain in right leg: Secondary | ICD-10-CM | POA: Insufficient documentation

## 2016-09-03 DIAGNOSIS — H60392 Other infective otitis externa, left ear: Secondary | ICD-10-CM

## 2016-09-03 DIAGNOSIS — M7918 Myalgia, other site: Secondary | ICD-10-CM

## 2016-09-03 DIAGNOSIS — I10 Essential (primary) hypertension: Secondary | ICD-10-CM | POA: Insufficient documentation

## 2016-09-03 DIAGNOSIS — F17210 Nicotine dependence, cigarettes, uncomplicated: Secondary | ICD-10-CM | POA: Insufficient documentation

## 2016-09-03 DIAGNOSIS — Z79899 Other long term (current) drug therapy: Secondary | ICD-10-CM | POA: Insufficient documentation

## 2016-09-03 DIAGNOSIS — M79605 Pain in left leg: Secondary | ICD-10-CM | POA: Insufficient documentation

## 2016-09-03 DIAGNOSIS — H6092 Unspecified otitis externa, left ear: Secondary | ICD-10-CM | POA: Insufficient documentation

## 2016-09-03 DIAGNOSIS — G8929 Other chronic pain: Secondary | ICD-10-CM | POA: Insufficient documentation

## 2016-09-03 DIAGNOSIS — M791 Myalgia: Secondary | ICD-10-CM | POA: Insufficient documentation

## 2016-09-03 LAB — POCT UA - MICROALBUMIN

## 2016-09-03 MED ORDER — CIPROFLOXACIN HCL 0.2 % OT SOLN: 0.2000 mL | Freq: Two times a day (BID) | OTIC | 0 refills | Status: DC

## 2016-09-03 MED ORDER — AMOXICILLIN-POT CLAVULANATE 875-125 MG PO TABS: 1.0000 | ORAL_TABLET | Freq: Two times a day (BID) | ORAL | 0 refills | Status: DC

## 2016-09-03 MED FILL — CIPROFLOXACIN 0.2% OTIC SOL: 0.2 | 7 days supply | Qty: 1 | Fill #0

## 2016-09-03 MED FILL — traMADol HCL 50 MG TABS: 50 | 30 days supply | Qty: 60 | Fill #1

## 2016-09-03 MED FILL — AMOX-CLAV 875-125 MG TABLET: 875-125 | 10 days supply | Qty: 20 | Fill #0

## 2016-09-03 NOTE — Patient Instructions (Addendum)
[name] was seen today for ear pain.  Diagnoses and all orders for this visit:  Essential hypertension -     POCT UA - Microalbumin  Chronic musculoskeletal pain -     Drug Screen, Urine  Other infective acute otitis externa of left ear -     amoxicillin-clavulanate (AUGMENTIN) 875-125 MG tablet; Take 1 tablet by mouth 2 (two) times daily. -     Ciprofloxacin HCl 0.2 % otic solution; Place 0.2 mLs into the left ear 2 (two) times daily. For 7 days   F/u in 3 weeks to recheck L ear and pap smear   Dr. Adrian Blackwater

## 2016-09-03 NOTE — Progress Notes (Signed)
Subjective:  Patient ID: [name], female    DOB: April 03, 1960  Age: 56 y.o. MRN: [id number]  CC: Ear Pain   HPI [name] presents for   1. URI: she reports cough and congestion x 3 weeks. Improving. No fever. Started having pain and draining for L ear 1 weeks ago. No CP or SOB.  2. Chronic pain: in legs mostly at times in back. She request tramadol refill. She has history of cocaine abuse. Denies current substance abuse.   Social History  Substance Use Topics  . Smoking status: Current Every Day Smoker    Packs/day: 1.00    Years: 0.00    Types: Cigarettes  . Smokeless tobacco: Never Used  . Alcohol use 4.2 oz/week    7 Cans of beer per week    Outpatient Medications Prior to Visit  Medication Sig Dispense Refill  . busPIRone (BUSPAR) 10 MG tablet Take 1 tablet (10 mg total) by mouth 2 (two) times daily. 60 tablet 11  . carvedilol (COREG) 6.25 MG tablet TAKE 1 TABLET BY MOUTH 2 TIMES DAILY WITH A MEAL 60 tablet 2  . cetirizine (ZYRTEC) 10 MG tablet TAKE 1 TABLET BY MOUTH DAILY. 30 tablet 3  . cyanocobalamin 1000 MCG tablet Take 1 tablet (1,000 mcg total) by mouth daily. 30 tablet 2  . ferrous sulfate 325 (65 FE) MG tablet Take 1 tablet (325 mg total) by mouth 2 (two) times daily with a meal. 60 tablet 3  . gabapentin (NEURONTIN) 300 MG capsule Take 1 capsule (300 mg total) by mouth 3 (three) times daily. 30 capsule 0  . hydrALAZINE (APRESOLINE) 25 MG tablet TAKE 1 TABLET BY MOUTH 2 TIMES DAILY. 60 tablet 2  . isosorbide mononitrate (IMDUR) 30 MG 24 hr tablet Take 1 tablet (30 mg total) by mouth daily. 30 tablet 1  . omeprazole (PRILOSEC) 20 MG capsule Take 1 capsule (20 mg total) by mouth 2 (two) times daily before a meal. 60 capsule 1  . polyethylene glycol powder (GLYCOLAX/MIRALAX) powder Take 17 g by mouth daily. (Patient taking differently: Take 17 g by mouth daily as needed for mild constipation. ) 3350 g 5  . traMADol (ULTRAM) 50 MG tablet TAKE ONE TABLET BY  MOUTH EVERY 12 HOURS AS NEEDED FOR MODERATE PAIN 60 tablet 1  . traZODone (DESYREL) 50 MG tablet Take 0.5-1 tablets (25-50 mg total) by mouth at bedtime as needed. for sleep 30 tablet 11  . VENTOLIN HFA 108 (90 Base) MCG/ACT inhaler INHALE 2 PUFFS INTO THE LUNGS EVERY 6 HOURS AS NEEDED FOR WHEEZING OR SHORTNESS OF BREATH. 18 g 0  . nicotine (NICODERM CQ - DOSED IN MG/24 HOURS) 14 mg/24hr patch Place 1 patch (14 mg total) onto the skin daily. (Patient not taking: Reported on 07/11/2016) 28 patch 0  . nicotine (NICODERM CQ - DOSED IN MG/24 HOURS) 21 mg/24hr patch Place 1 patch (21 mg total) onto the skin daily. (Patient not taking: Reported on 07/11/2016) 28 patch 0   No facility-administered medications prior to visit.     ROS Review of Systems  Constitutional: Negative for chills and fever.  HENT: Positive for congestion, ear discharge and ear pain.   Eyes: Negative for visual disturbance.  Respiratory: Negative for shortness of breath.   Cardiovascular: Negative for chest pain.  Gastrointestinal: Negative for abdominal pain and blood in stool.  Musculoskeletal: Positive for arthralgias, back pain and myalgias.  Skin: Negative for rash.  Allergic/Immunologic: Negative for immunocompromised state.  Hematological: Negative for adenopathy. Does not bruise/bleed easily.  Psychiatric/Behavioral: Negative for dysphoric mood and suicidal ideas.    Objective:  BP 125/72   Pulse 90   Temp 98.2 F (36.8 C) (Oral)   Wt 140 lb 12.8 oz (63.9 kg)   SpO2 98%   BMI 22.05 kg/Cynthia   BP/Weight 09/03/2016 1/42/3953 2/0/2334  Systolic BP 356 861 683  Diastolic BP 72 82 78  Wt. (Lbs) 140.8 137.2 -  BMI 22.05 21.49 -  Some encounter information is confidential and restricted. Go to Review Flowsheets activity to see all data.    Physical Exam  Constitutional: She is oriented to person, place, and time. She appears well-developed and well-nourished. No distress.  HENT:  Head: Normocephalic and  atraumatic.  Right Ear: Tympanic membrane, external ear and ear canal normal.  Left Ear: There is drainage (white) and tenderness.  Nose: Right sinus exhibits no maxillary sinus tenderness and no frontal sinus tenderness. Left sinus exhibits no maxillary sinus tenderness and no frontal sinus tenderness.  Mouth/Throat: Oropharynx is clear and moist.  Neck: Normal range of motion. Neck supple.  Cardiovascular: Normal rate, regular rhythm, normal heart sounds and intact distal pulses.   Pulmonary/Chest: Effort normal and breath sounds normal.  Musculoskeletal: She exhibits no edema.  Lymphadenopathy:    She has no cervical adenopathy.  Neurological: She is alert and oriented to person, place, and time.  Skin: Skin is warm and dry. No rash noted.  Psychiatric: She has a normal mood and affect.     Assessment & Plan:  [name] was seen today for ear pain.  Diagnoses and all orders for this visit:  Essential hypertension -     POCT UA - Microalbumin  Chronic musculoskeletal pain -     Drug Screen, Urine  Other infective acute otitis externa of left ear -     amoxicillin-clavulanate (AUGMENTIN) 875-125 MG tablet; Take 1 tablet by mouth 2 (two) times daily. -     Ciprofloxacin HCl 0.2 % otic solution; Place 0.2 mLs into the left ear 2 (two) times daily. For 7 days   There are no diagnoses linked to this encounter.  No orders of the defined types were placed in this encounter.   Follow-up: No Follow-up on file.   Boykin Nearing MD

## 2016-09-05 DIAGNOSIS — H6090 Unspecified otitis externa, unspecified ear: Secondary | ICD-10-CM | POA: Insufficient documentation

## 2016-09-05 NOTE — Assessment & Plan Note (Signed)
Otitis externa following sinusitis Augment and cipro ear drops

## 2016-09-24 ENCOUNTER — Other Ambulatory Visit: Payer: Self-pay | Admitting: Family Medicine

## 2016-09-24 DIAGNOSIS — F419 Anxiety disorder, unspecified: Secondary | ICD-10-CM

## 2016-09-24 MED FILL — hydrALAZINE HCL 25 MG TABS: 25 | 30 days supply | Qty: 60 | Fill #1

## 2016-09-24 MED FILL — ?TRAZODONE 50 MG TABLET: 50 | 30 days supply | Qty: 30 | Fill #0

## 2016-09-24 MED FILL — ?CETIRIZINE HCL 10 MG TABLE: 10 | 30 days supply | Qty: 30 | Fill #1

## 2016-09-24 MED FILL — GABAPENTIN 300 MG CAPSULE: 300 | 30 days supply | Qty: 180 | Fill #4

## 2016-09-24 MED FILL — ?OMEPRAZOLE DR 20 MG CAPSUL: 20 | 30 days supply | Qty: 30 | Fill #4

## 2016-09-24 MED FILL — ?BUSPIRONE HCL 10 MG TABLET: 10 | 30 days supply | Qty: 60 | Fill #5

## 2016-09-24 MED FILL — CARVEDILOL 6.25 MG TABLET: 6.25 | 30 days supply | Qty: 60 | Fill #1

## 2016-10-08 ENCOUNTER — Encounter: Payer: Self-pay | Admitting: Family Medicine

## 2016-10-08 ENCOUNTER — Ambulatory Visit: Payer: Self-pay | Attending: Family Medicine | Admitting: Family Medicine

## 2016-10-08 VITALS — BP 149/70 | HR 95 | Temp 98.3°F | Ht 67.0 in | Wt 138.8 lb

## 2016-10-08 DIAGNOSIS — R0989 Other specified symptoms and signs involving the circulatory and respiratory systems: Secondary | ICD-10-CM | POA: Insufficient documentation

## 2016-10-08 DIAGNOSIS — F332 Major depressive disorder, recurrent severe without psychotic features: Secondary | ICD-10-CM | POA: Insufficient documentation

## 2016-10-08 DIAGNOSIS — R05 Cough: Secondary | ICD-10-CM | POA: Insufficient documentation

## 2016-10-08 DIAGNOSIS — M791 Myalgia: Secondary | ICD-10-CM | POA: Insufficient documentation

## 2016-10-08 DIAGNOSIS — F1410 Cocaine abuse, uncomplicated: Secondary | ICD-10-CM | POA: Insufficient documentation

## 2016-10-08 DIAGNOSIS — F419 Anxiety disorder, unspecified: Secondary | ICD-10-CM

## 2016-10-08 DIAGNOSIS — G8929 Other chronic pain: Secondary | ICD-10-CM | POA: Insufficient documentation

## 2016-10-08 DIAGNOSIS — M7918 Myalgia, other site: Secondary | ICD-10-CM

## 2016-10-08 DIAGNOSIS — Z124 Encounter for screening for malignant neoplasm of cervix: Secondary | ICD-10-CM

## 2016-10-08 DIAGNOSIS — M79604 Pain in right leg: Secondary | ICD-10-CM | POA: Insufficient documentation

## 2016-10-08 DIAGNOSIS — H60392 Other infective otitis externa, left ear: Secondary | ICD-10-CM

## 2016-10-08 DIAGNOSIS — F17210 Nicotine dependence, cigarettes, uncomplicated: Secondary | ICD-10-CM | POA: Insufficient documentation

## 2016-10-08 DIAGNOSIS — Z79899 Other long term (current) drug therapy: Secondary | ICD-10-CM | POA: Insufficient documentation

## 2016-10-08 MED ORDER — BUSPIRONE HCL 10 MG PO TABS
20.0000 mg | ORAL_TABLET | Freq: Two times a day (BID) | ORAL | 11 refills | Status: DC
Start: 2016-10-08 — End: 2017-04-25

## 2016-10-08 MED ORDER — TRAZODONE HCL 50 MG PO TABS: 100.0000 mg | ORAL_TABLET | Freq: Every evening | ORAL | 11 refills | Status: DC | PRN

## 2016-10-08 MED ORDER — TRAMADOL HCL 50 MG PO TABS: 50.0000 mg | ORAL_TABLET | Freq: Two times a day (BID) | ORAL | 2 refills | Status: DC | PRN

## 2016-10-08 MED FILL — traMADol HCL 50 MG TABS: 50 | 30 days supply | Qty: 60 | Fill #0

## 2016-10-08 NOTE — Assessment & Plan Note (Signed)
Resolved

## 2016-10-08 NOTE — Progress Notes (Signed)
Subjective:  Patient ID: [name], female    DOB: 19-May-1960  Age: 56 y.o. MRN: [id number]  CC: Gynecologic Exam   HPI [name] LP [name] presents for   1. URI: she completed Augmentin. Denies ear pain. Still has cough and chest congestion. No chest pain or shortness of breath. She is a smoker.   2. Pap smear: her last pap smear was HPV + pap with normal cytology (cells) repeat pap in one year recommended. She is here for repeat pap smear.   3. Chronic pain: in legs mostly at times in back. She request tramadol refill. She has history of cocaine abuse. Denies current substance abuse.   4. Mood disorder: she denies cocaine use. She has unstable home environment. She is established  with the [name]) and has housing resources if needed. She reports mood is stable but she has increased Buspar to 20 mg BID and takes 100 mg of trazodone nightly.   Social History  Substance Use Topics  . Smoking status: Current Every Day Smoker    Packs/day: 1.00    Years: 0.00    Types: Cigarettes  . Smokeless tobacco: Never Used  . Alcohol use 4.2 oz/week    7 Cans of beer per week    Outpatient Medications Prior to Visit  Medication Sig Dispense Refill  . amoxicillin-clavulanate (AUGMENTIN) 875-125 MG tablet Take 1 tablet by mouth 2 (two) times daily. 20 tablet 0  . busPIRone (BUSPAR) 10 MG tablet Take 1 tablet (10 mg total) by mouth 2 (two) times daily. 60 tablet 11  . carvedilol (COREG) 6.25 MG tablet TAKE 1 TABLET BY MOUTH 2 TIMES DAILY WITH A MEAL 60 tablet 2  . cetirizine (ZYRTEC) 10 MG tablet TAKE 1 TABLET BY MOUTH DAILY. 30 tablet 3  . Ciprofloxacin HCl 0.2 % otic solution Place 0.2 mLs into the left ear 2 (two) times daily. For 7 days 14 vial 0  . cyanocobalamin 1000 MCG tablet Take 1 tablet (1,000 mcg total) by mouth daily. 30 tablet 2  . ferrous sulfate 325 (65 FE) MG tablet Take 1 tablet (325 mg total) by mouth 2 (two) times daily with a meal. 60 tablet 3  .  gabapentin (NEURONTIN) 300 MG capsule Take 1 capsule (300 mg total) by mouth 3 (three) times daily. 30 capsule 0  . hydrALAZINE (APRESOLINE) 25 MG tablet TAKE 1 TABLET BY MOUTH 2 TIMES DAILY. 60 tablet 2  . isosorbide mononitrate (IMDUR) 30 MG 24 hr tablet Take 1 tablet (30 mg total) by mouth daily. 30 tablet 1  . nicotine (NICODERM CQ - DOSED IN MG/24 HOURS) 14 mg/24hr patch Place 1 patch (14 mg total) onto the skin daily. (Patient not taking: Reported on 07/11/2016) 28 patch 0  . nicotine (NICODERM CQ - DOSED IN MG/24 HOURS) 21 mg/24hr patch Place 1 patch (21 mg total) onto the skin daily. (Patient not taking: Reported on 07/11/2016) 28 patch 0  . omeprazole (PRILOSEC) 20 MG capsule Take 1 capsule (20 mg total) by mouth 2 (two) times daily before a meal. 60 capsule 1  . polyethylene glycol powder (GLYCOLAX/MIRALAX) powder Take 17 g by mouth daily. (Patient taking differently: Take 17 g by mouth daily as needed for mild constipation. ) 3350 g 5  . traMADol (ULTRAM) 50 MG tablet TAKE ONE TABLET BY MOUTH EVERY 12 HOURS AS NEEDED FOR MODERATE PAIN 60 tablet 1  . traZODone (DESYREL) 50 MG tablet Take 0.5-1 tablets (25-50 mg total) by mouth  at bedtime as needed. for sleep 30 tablet 11  . VENTOLIN HFA 108 (90 Base) MCG/ACT inhaler INHALE 2 PUFFS INTO THE LUNGS EVERY 6 HOURS AS NEEDED FOR WHEEZING OR SHORTNESS OF BREATH. 18 g 0   No facility-administered medications prior to visit.     ROS Review of Systems  Constitutional: Negative for chills and fever.  HENT: Positive for congestion. Negative for ear discharge and ear pain.   Eyes: Negative for visual disturbance.  Respiratory: Negative for shortness of breath.   Cardiovascular: Negative for chest pain.  Gastrointestinal: Negative for abdominal pain and blood in stool.  Musculoskeletal: Positive for arthralgias, back pain and myalgias.  Skin: Negative for rash.  Allergic/Immunologic: Negative for immunocompromised state.  Hematological: Negative  for adenopathy. Does not bruise/bleed easily.  Psychiatric/Behavioral: Negative for dysphoric mood and suicidal ideas.    Objective:  BP (!) 149/70   Pulse 95   Temp 98.3 F (36.8 C) (Oral)   Ht 5\' 7"  (1.702 Cynthia)   Wt 138 lb 12.8 oz (63 kg)   SpO2 97%   BMI 21.74 kg/Cynthia   BP/Weight 10/08/2016 09/03/2016 6/76/1950  Systolic BP 932 671 245  Diastolic BP 70 72 82  Wt. (Lbs) 138.8 140.8 137.2  BMI 21.74 22.05 21.49  Some encounter information is confidential and restricted. Go to Review Flowsheets activity to see all data.    Physical Exam  Constitutional: She is oriented to person, place, and time. She appears well-developed and well-nourished. No distress.  HENT:  Head: Normocephalic and atraumatic.  Right Ear: Tympanic membrane, external ear and ear canal normal.  Left Ear: There is drainage (white) and tenderness.  Nose: Right sinus exhibits no maxillary sinus tenderness and no frontal sinus tenderness. Left sinus exhibits no maxillary sinus tenderness and no frontal sinus tenderness.  Mouth/Throat: Oropharynx is clear and moist.  Neck: Normal range of motion. Neck supple.  Cardiovascular: Normal rate, regular rhythm, normal heart sounds and intact distal pulses.   Pulmonary/Chest: Effort normal and breath sounds normal.  Genitourinary: Vagina normal and uterus normal.    Pelvic exam was performed with patient supine. There is no rash, tenderness or lesion on the right labia. There is no rash, tenderness or lesion on the left labia. Cervix exhibits no motion tenderness, no discharge and no friability.  Musculoskeletal: She exhibits no edema.  Lymphadenopathy:    She has no cervical adenopathy.       Right: No inguinal adenopathy present.       Left: No inguinal adenopathy present.  Neurological: She is alert and oriented to person, place, and time.  Skin: Skin is warm and dry. No rash noted.  Psychiatric: She has a normal mood and affect.     Assessment & Plan:  [name]  was seen today for gynecologic exam.  Diagnoses and all orders for this visit:  Pap smear for cervical cancer screening -     Cytology - PAP  Major depressive disorder, recurrent severe without psychotic features ([place]) -     traZODone (DESYREL) 50 MG tablet; Take 2 tablets (100 mg total) by mouth at bedtime as needed. for sleep  Chronic anxiety -     busPIRone (BUSPAR) 10 MG tablet; Take 2 tablets (20 mg total) by mouth 2 (two) times daily.  Chronic musculoskeletal pain -     traMADol (ULTRAM) 50 MG tablet; Take 1 tablet (50 mg total) by mouth every 12 (twelve) hours as needed.   There are no diagnoses linked to this encounter.  No orders of the defined types were placed in this encounter.   Follow-up: Return in about 3 months (around 01/08/2017).   Boykin Nearing MD

## 2016-10-08 NOTE — Assessment & Plan Note (Signed)
Refilled trazodone at 100 mg nightly

## 2016-10-08 NOTE — Assessment & Plan Note (Signed)
Refilled Buspar at increased dose of 20 mg BID

## 2016-10-08 NOTE — Patient Instructions (Signed)
[name] was seen today for gynecologic exam.  Diagnoses and all orders for this visit:  Pap smear for cervical cancer screening -     Cytology - PAP  Major depressive disorder, recurrent severe without psychotic features ([place]) -     traZODone (DESYREL) 50 MG tablet; Take 2 tablets (100 mg total) by mouth at bedtime as needed. for sleep  Chronic anxiety -     busPIRone (BUSPAR) 10 MG tablet; Take 2 tablets (20 mg total) by mouth 2 (two) times daily.  Chronic musculoskeletal pain -     traMADol (ULTRAM) 50 MG tablet; Take 1 tablet (50 mg total) by mouth every 12 (twelve) hours as needed.  you will be called with pap results  F/u in 3 months for flu shot, chronic pain, anxiety and insomnia  Dr. Adrian Blackwater

## 2016-10-08 NOTE — Assessment & Plan Note (Signed)
Tramadol refilled.

## 2016-10-09 LAB — CERVICOVAGINAL ANCILLARY ONLY
Candida vaginitis: NEGATIVE
Chlamydia: NEGATIVE
NEISSERIA GONORRHEA: NEGATIVE
TRICH (WINDOWPATH): NEGATIVE

## 2016-10-10 LAB — CYTOLOGY - PAP
Diagnosis: NEGATIVE
HPV (WINDOPATH): DETECTED — AB

## 2016-10-11 ENCOUNTER — Telehealth: Payer: Self-pay

## 2016-10-11 NOTE — Telephone Encounter (Signed)
Pt was called and a VM was left informing pt to return phone call. If pt returns phone call please inform pt of:   Vaginal STI and yeast screen negative   HPV positive  Normal cytology  Repeat pap smear in one year

## 2016-10-23 MED FILL — ?CETIRIZINE HCL 10 MG TABLE: 10 | 30 days supply | Qty: 30 | Fill #2

## 2016-10-23 MED FILL — busPIRone HCL 10 MG TABS: 10 | 30 days supply | Qty: 120 | Fill #0

## 2016-10-23 MED FILL — GABAPENTIN 300 MG CAPSULE: 300 | 30 days supply | Qty: 180 | Fill #5

## 2016-10-23 MED FILL — traZODone HCL 50 MG TABS: 50 | 30 days supply | Qty: 60 | Fill #0

## 2016-11-04 MED FILL — !VENTOLIN HFA INHALER: 108 (90 BAS | 25 days supply | Qty: 18 | Fill #1

## 2016-11-04 MED FILL — traMADol HCL 50 MG TABS: 50 | 30 days supply | Qty: 60 | Fill #1

## 2016-12-04 ENCOUNTER — Other Ambulatory Visit: Payer: Self-pay | Admitting: Family Medicine

## 2016-12-04 DIAGNOSIS — J301 Allergic rhinitis due to pollen: Secondary | ICD-10-CM

## 2016-12-04 MED FILL — CARVEDILOL 6.25 MG TABLET: 6.25 | 30 days supply | Qty: 60 | Fill #2

## 2016-12-04 MED FILL — ?CETIRIZINE HCL 10 MG TABLE: 10 | 30 days supply | Qty: 30 | Fill #3

## 2016-12-04 MED FILL — GABAPENTIN 300 MG CAPSULE: 300 | 30 days supply | Qty: 180 | Fill #6

## 2016-12-04 MED FILL — busPIRone HCL 10 MG TABS: 10 | 30 days supply | Qty: 120 | Fill #1

## 2016-12-04 MED FILL — traZODone HCL 50 MG TABS: 50 | 30 days supply | Qty: 60 | Fill #1

## 2016-12-06 MED FILL — traMADol HCL 50 MG TABS: 50 | 30 days supply | Qty: 60 | Fill #2

## 2016-12-06 MED FILL — ?OMEPRAZOLE DR 20 MG CAPSUL: 20 | 30 days supply | Qty: 30 | Fill #5

## 2016-12-17 ENCOUNTER — Ambulatory Visit: Payer: Self-pay | Admitting: Internal Medicine

## 2017-01-07 MED FILL — GABAPENTIN 300 MG CAPSULE: 300 | 30 days supply | Qty: 180 | Fill #7

## 2017-01-07 MED FILL — busPIRone HCL 10 MG TABS: 10 | 30 days supply | Qty: 120 | Fill #2

## 2017-01-07 MED FILL — traZODone HCL 50 MG TABS: 50 | 30 days supply | Qty: 60 | Fill #2

## 2017-01-07 MED FILL — ?OMEPRAZOLE DR 20 MG CAPSUL: 20 | 30 days supply | Qty: 30 | Fill #6

## 2017-04-01 MED FILL — busPIRone HCL 10 MG TABS: 10 | 30 days supply | Qty: 120 | Fill #3

## 2017-04-01 MED FILL — GABAPENTIN 300 MG CAPSULE: 300 | 30 days supply | Qty: 180 | Fill #8

## 2017-04-01 MED FILL — traZODone HCL 50 MG TABS: 50 | 30 days supply | Qty: 60 | Fill #3

## 2017-04-25 ENCOUNTER — Ambulatory Visit: Payer: Self-pay | Attending: Internal Medicine | Admitting: Internal Medicine

## 2017-04-25 ENCOUNTER — Encounter: Payer: Self-pay | Admitting: Internal Medicine

## 2017-04-25 VITALS — BP 155/92 | HR 102 | Temp 97.9°F | Resp 16 | Wt 143.8 lb

## 2017-04-25 DIAGNOSIS — Z760 Encounter for issue of repeat prescription: Secondary | ICD-10-CM | POA: Insufficient documentation

## 2017-04-25 DIAGNOSIS — J449 Chronic obstructive pulmonary disease, unspecified: Secondary | ICD-10-CM | POA: Insufficient documentation

## 2017-04-25 DIAGNOSIS — F17210 Nicotine dependence, cigarettes, uncomplicated: Secondary | ICD-10-CM | POA: Insufficient documentation

## 2017-04-25 DIAGNOSIS — F1410 Cocaine abuse, uncomplicated: Secondary | ICD-10-CM | POA: Insufficient documentation

## 2017-04-25 DIAGNOSIS — F1010 Alcohol abuse, uncomplicated: Secondary | ICD-10-CM | POA: Insufficient documentation

## 2017-04-25 DIAGNOSIS — N179 Acute kidney failure, unspecified: Secondary | ICD-10-CM | POA: Insufficient documentation

## 2017-04-25 DIAGNOSIS — Z23 Encounter for immunization: Secondary | ICD-10-CM | POA: Insufficient documentation

## 2017-04-25 DIAGNOSIS — K219 Gastro-esophageal reflux disease without esophagitis: Secondary | ICD-10-CM | POA: Insufficient documentation

## 2017-04-25 DIAGNOSIS — Z590 Homelessness: Secondary | ICD-10-CM | POA: Insufficient documentation

## 2017-04-25 DIAGNOSIS — F419 Anxiety disorder, unspecified: Secondary | ICD-10-CM | POA: Insufficient documentation

## 2017-04-25 DIAGNOSIS — D509 Iron deficiency anemia, unspecified: Secondary | ICD-10-CM | POA: Insufficient documentation

## 2017-04-25 DIAGNOSIS — B182 Chronic viral hepatitis C: Secondary | ICD-10-CM | POA: Insufficient documentation

## 2017-04-25 DIAGNOSIS — I129 Hypertensive chronic kidney disease with stage 1 through stage 4 chronic kidney disease, or unspecified chronic kidney disease: Secondary | ICD-10-CM | POA: Insufficient documentation

## 2017-04-25 DIAGNOSIS — M79662 Pain in left lower leg: Secondary | ICD-10-CM | POA: Insufficient documentation

## 2017-04-25 DIAGNOSIS — F332 Major depressive disorder, recurrent severe without psychotic features: Secondary | ICD-10-CM | POA: Insufficient documentation

## 2017-04-25 DIAGNOSIS — Z888 Allergy status to other drugs, medicaments and biological substances status: Secondary | ICD-10-CM | POA: Insufficient documentation

## 2017-04-25 DIAGNOSIS — M7918 Myalgia, other site: Secondary | ICD-10-CM

## 2017-04-25 DIAGNOSIS — N644 Mastodynia: Secondary | ICD-10-CM | POA: Insufficient documentation

## 2017-04-25 DIAGNOSIS — G8929 Other chronic pain: Secondary | ICD-10-CM | POA: Insufficient documentation

## 2017-04-25 DIAGNOSIS — M549 Dorsalgia, unspecified: Secondary | ICD-10-CM | POA: Insufficient documentation

## 2017-04-25 DIAGNOSIS — I10 Essential (primary) hypertension: Secondary | ICD-10-CM | POA: Insufficient documentation

## 2017-04-25 DIAGNOSIS — M7989 Other specified soft tissue disorders: Secondary | ICD-10-CM | POA: Insufficient documentation

## 2017-04-25 DIAGNOSIS — Z79899 Other long term (current) drug therapy: Secondary | ICD-10-CM | POA: Insufficient documentation

## 2017-04-25 DIAGNOSIS — M79661 Pain in right lower leg: Secondary | ICD-10-CM | POA: Insufficient documentation

## 2017-04-25 MED ORDER — GABAPENTIN 300 MG PO CAPS: 300.0000 mg | ORAL_CAPSULE | Freq: Three times a day (TID) | ORAL | 6 refills | Status: DC

## 2017-04-25 MED ORDER — CARVEDILOL 6.25 MG PO TABS
ORAL_TABLET | ORAL | 4 refills | Status: DC
Start: 2017-04-25 — End: 2017-09-11

## 2017-04-25 MED ORDER — OMEPRAZOLE 20 MG PO CPDR: 20.0000 mg | DELAYED_RELEASE_CAPSULE | Freq: Two times a day (BID) | ORAL | 5 refills | Status: DC

## 2017-04-25 MED ORDER — ALBUTEROL SULFATE HFA 108 (90 BASE) MCG/ACT IN AERS: INHALATION_SPRAY | RESPIRATORY_TRACT | 6 refills | Status: DC

## 2017-04-25 MED ORDER — BUSPIRONE HCL 10 MG PO TABS: 20.0000 mg | ORAL_TABLET | Freq: Two times a day (BID) | ORAL | 11 refills | Status: DC

## 2017-04-25 MED ORDER — TRAZODONE HCL 50 MG PO TABS: 100.0000 mg | ORAL_TABLET | Freq: Every evening | ORAL | 11 refills | Status: DC | PRN

## 2017-04-25 MED ORDER — TRAMADOL HCL 50 MG PO TABS: 50.0000 mg | ORAL_TABLET | Freq: Two times a day (BID) | ORAL | 0 refills | Status: DC | PRN

## 2017-04-25 MED ORDER — FERROUS SULFATE 325 (65 FE) MG PO TABS: 325.0000 mg | ORAL_TABLET | Freq: Two times a day (BID) | ORAL | 3 refills | Status: DC

## 2017-04-25 MED FILL — FERROUS SULFATE 325 MG TAB: 325 (65 FE) | 30 days supply | Qty: 60 | Fill #0

## 2017-04-25 MED FILL — busPIRone HCL 10 MG TABS: 10 | 30 days supply | Qty: 120 | Fill #0

## 2017-04-25 MED FILL — ?CARVEDILOL 6.25 MG TABLET: 6.25 | 30 days supply | Qty: 60 | Fill #0

## 2017-04-25 MED FILL — ?OMEPRAZOLE 20 MG CPDR: 20 | 30 days supply | Qty: 60 | Fill #0

## 2017-04-25 MED FILL — traZODone HCL 50 MG TABS: 50 | 30 days supply | Qty: 60 | Fill #0

## 2017-04-25 MED FILL — !VENTOLIN HFA INHALER: 108 (90 BAS | 25 days supply | Qty: 18 | Fill #0
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
## 2017-04-26 LAB — CBC
HEMATOCRIT: 32.9 % — AB (ref 34.0–46.6)
HEMOGLOBIN: 10.9 g/dL — AB (ref 11.1–15.9)
MCH: 31.6 pg (ref 26.6–33.0)
MCHC: 33.1 g/dL (ref 31.5–35.7)
MCV: 95 fL (ref 79–97)
Platelets: 324 10*3/uL (ref 150–379)
RBC: 3.45 x10E6/uL — ABNORMAL LOW (ref 3.77–5.28)
RDW: 18.1 % — ABNORMAL HIGH (ref 12.3–15.4)
WBC: 5.8 10*3/uL (ref 3.4–10.8)

## 2017-04-26 LAB — COMPREHENSIVE METABOLIC PANEL
ALT: 22 IU/L (ref 0–32)
AST: 45 IU/L — AB (ref 0–40)
Albumin/Globulin Ratio: 1.3 (ref 1.2–2.2)
Albumin: 4.4 g/dL (ref 3.5–5.5)
Alkaline Phosphatase: 68 IU/L (ref 39–117)
BUN/Creatinine Ratio: 10 (ref 9–23)
BUN: 18 mg/dL (ref 6–24)
Bilirubin Total: <0.2 mg/dL (ref 0.0–1.2)
CALCIUM: 9.5 mg/dL (ref 8.7–10.2)
CO2: 17 mmol/L — AB (ref 20–29)
CREATININE: 1.72 mg/dL — AB (ref 0.57–1.00)
Chloride: 99 mmol/L (ref 96–106)
GFR calc Af Amer: 38 mL/min/{1.73_m2} — ABNORMAL LOW (ref >59)
GFR, EST NON AFRICAN AMERICAN: 33 mL/min/{1.73_m2} — AB (ref >59)
Globulin, Total: 3.4 g/dL (ref 1.5–4.5)
Glucose: 78 mg/dL (ref 65–99)
Potassium: 5.1 mmol/L (ref 3.5–5.2)
Sodium: 136 mmol/L (ref 134–144)
Total Protein: 7.8 g/dL (ref 6.0–8.5)

## 2017-04-26 LAB — LIPID PANEL
CHOL/HDL RATIO: 3.5 ratio (ref 0.0–4.4)
CHOLESTEROL TOTAL: 226 mg/dL — AB (ref 100–199)
HDL: 65 mg/dL (ref >39)
LDL CALC: 114 mg/dL — AB (ref 0–99)
Triglycerides: 235 mg/dL — ABNORMAL HIGH (ref 0–149)
VLDL Cholesterol Cal: 47 mg/dL — ABNORMAL HIGH (ref 5–40)

## 2017-04-27 ENCOUNTER — Other Ambulatory Visit: Payer: Self-pay | Admitting: Internal Medicine

## 2017-04-27 DIAGNOSIS — M7918 Myalgia, other site: Principal | ICD-10-CM

## 2017-04-27 DIAGNOSIS — G8929 Other chronic pain: Secondary | ICD-10-CM

## 2017-04-27 MED ORDER — ATORVASTATIN CALCIUM 10 MG PO TABS: 5.0000 mg | ORAL_TABLET | Freq: Every day | ORAL | 3 refills | Status: DC

## 2017-04-27 MED ORDER — GABAPENTIN 300 MG PO CAPS: 300.0000 mg | ORAL_CAPSULE | Freq: Two times a day (BID) | ORAL | 6 refills | Status: DC

## 2017-05-02 ENCOUNTER — Telehealth: Payer: Self-pay

## 2017-05-02 NOTE — Telephone Encounter (Signed)
Contacted pt to go over lab results pt didn't answer lvm asking pt to give me a call at her earliest convenience  

## 2017-05-19 MED FILL — GABAPENTIN 300 MG CAPSULE: 300 | 30 days supply | Qty: 60 | Fill #0

## 2017-05-28 ENCOUNTER — Ambulatory Visit: Payer: Self-pay

## 2017-05-30 ENCOUNTER — Other Ambulatory Visit: Payer: Self-pay | Admitting: Internal Medicine

## 2017-05-30 DIAGNOSIS — N644 Mastodynia: Secondary | ICD-10-CM

## 2017-06-06 ENCOUNTER — Ambulatory Visit: Payer: Self-pay | Admitting: Internal Medicine

## 2017-07-14 ENCOUNTER — Ambulatory Visit: Payer: Self-pay | Admitting: Internal Medicine

## 2017-09-09 ENCOUNTER — Ambulatory Visit: Payer: Self-pay

## 2017-09-11 ENCOUNTER — Ambulatory Visit: Payer: Self-pay | Attending: Internal Medicine | Admitting: Physician Assistant

## 2017-09-11 ENCOUNTER — Ambulatory Visit: Payer: Self-pay | Admitting: Licensed Clinical Social Worker

## 2017-09-11 VITALS — BP 164/96 | HR 84 | Temp 98.2°F | Resp 18 | Ht 67.0 in | Wt 135.0 lb

## 2017-09-11 DIAGNOSIS — J449 Chronic obstructive pulmonary disease, unspecified: Secondary | ICD-10-CM | POA: Insufficient documentation

## 2017-09-11 DIAGNOSIS — E1122 Type 2 diabetes mellitus with diabetic chronic kidney disease: Secondary | ICD-10-CM | POA: Insufficient documentation

## 2017-09-11 DIAGNOSIS — F4321 Adjustment disorder with depressed mood: Secondary | ICD-10-CM

## 2017-09-11 DIAGNOSIS — Z79899 Other long term (current) drug therapy: Secondary | ICD-10-CM | POA: Insufficient documentation

## 2017-09-11 DIAGNOSIS — K219 Gastro-esophageal reflux disease without esophagitis: Secondary | ICD-10-CM | POA: Insufficient documentation

## 2017-09-11 DIAGNOSIS — I129 Hypertensive chronic kidney disease with stage 1 through stage 4 chronic kidney disease, or unspecified chronic kidney disease: Secondary | ICD-10-CM | POA: Insufficient documentation

## 2017-09-11 DIAGNOSIS — M7918 Myalgia, other site: Secondary | ICD-10-CM | POA: Insufficient documentation

## 2017-09-11 DIAGNOSIS — N183 Chronic kidney disease, stage 3 (moderate): Secondary | ICD-10-CM | POA: Insufficient documentation

## 2017-09-11 DIAGNOSIS — I10 Essential (primary) hypertension: Secondary | ICD-10-CM

## 2017-09-11 DIAGNOSIS — F332 Major depressive disorder, recurrent severe without psychotic features: Secondary | ICD-10-CM

## 2017-09-11 DIAGNOSIS — Z888 Allergy status to other drugs, medicaments and biological substances status: Secondary | ICD-10-CM | POA: Insufficient documentation

## 2017-09-11 DIAGNOSIS — G8929 Other chronic pain: Secondary | ICD-10-CM | POA: Insufficient documentation

## 2017-09-11 DIAGNOSIS — F419 Anxiety disorder, unspecified: Secondary | ICD-10-CM | POA: Insufficient documentation

## 2017-09-11 DIAGNOSIS — F4320 Adjustment disorder, unspecified: Secondary | ICD-10-CM | POA: Insufficient documentation

## 2017-09-11 DIAGNOSIS — D509 Iron deficiency anemia, unspecified: Secondary | ICD-10-CM | POA: Insufficient documentation

## 2017-09-11 DIAGNOSIS — E785 Hyperlipidemia, unspecified: Secondary | ICD-10-CM | POA: Insufficient documentation

## 2017-09-11 MED ORDER — BUSPIRONE HCL 10 MG PO TABS: 20.0000 mg | ORAL_TABLET | Freq: Two times a day (BID) | ORAL | 11 refills | Status: DC

## 2017-09-11 MED ORDER — GABAPENTIN 300 MG PO CAPS: 300.0000 mg | ORAL_CAPSULE | Freq: Two times a day (BID) | ORAL | 6 refills | Status: DC

## 2017-09-11 MED ORDER — OMEPRAZOLE 20 MG PO CPDR: 20.0000 mg | DELAYED_RELEASE_CAPSULE | Freq: Two times a day (BID) | ORAL | 5 refills | Status: DC

## 2017-09-11 MED ORDER — FERROUS SULFATE 325 (65 FE) MG PO TABS: 325.0000 mg | ORAL_TABLET | Freq: Two times a day (BID) | ORAL | 3 refills | Status: DC

## 2017-09-11 MED ORDER — ATORVASTATIN CALCIUM 10 MG PO TABS: 5.0000 mg | ORAL_TABLET | Freq: Every day | ORAL | 3 refills | Status: DC

## 2017-09-11 MED ORDER — CARVEDILOL 6.25 MG PO TABS: ORAL_TABLET | ORAL | 4 refills | Status: DC

## 2017-09-11 MED ORDER — ALBUTEROL SULFATE HFA 108 (90 BASE) MCG/ACT IN AERS: INHALATION_SPRAY | RESPIRATORY_TRACT | 6 refills | Status: DC

## 2017-09-11 MED ORDER — TRAZODONE HCL 50 MG PO TABS
100.0000 mg | ORAL_TABLET | Freq: Every evening | ORAL | 11 refills | Status: DC | PRN
Start: 2017-09-11 — End: 2019-01-02

## 2017-09-11 NOTE — Progress Notes (Signed)
Patient ID: RHYLIN VENTERS, female   DOB: 09-Aug-1960, 57 y.o.   MRN: [id number]   Latarshia Jersey, is a 57 y.o. female  [id number]:[id number]  [id number]  DOB - 1961/02/20  Subjective:  Chief Complaint and HPI: Shirl Weir is a 57 y.o. female here today to get restarted on all meds.  Patient quit taking care of herself or taking any of her medications about 4-5 months ago when her son who was in the [place] committed suicide.  Never saw kidney specialist after last visit.    Social:  2 surviving daughters, she is a widow and does not work.  No weapons in the home.  No desire to harm herself.  Knows she needs to get back into life.     ROS:   Constitutional:  No f/c, No night sweats, No unexplained weight loss. EENT:  No vision changes, No blurry vision, No hearing changes. No mouth, throat, or ear problems.  Respiratory: No cough, No SOB Cardiac: No CP, no palpitations GI:  +occasional abd pain, No N/V/D. GU: No Urinary s/sx Musculoskeletal: No joint pain Neuro: No headache, no dizziness, no motor weakness.  Skin: No rash Endocrine:  No polydipsia. No polyuria.  Psych: Denies SI/HI  No problems updated.  ALLERGIES: Allergies  Allergen Reactions  . Lisinopril Other (See Comments)    hyperkalemia    PAST MEDICAL HISTORY: Past Medical History:  Diagnosis Date  . Alcohol abuse   . Allergy   . Anxiety   . Arthritis   . Asthma   . Cancer ([place])   . Cannabis abuse   . Cocaine abuse ([place])   . Depression   . Drug addiction ([place])   . GERD (gastroesophageal reflux disease)   . Homelessness   . Hypertension    pt stated "every once in a while BP will be high but has not been prescribed medication for HTN.   . Seasonal allergies   . 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 Authorizing Provider  albuterol (VENTOLIN HFA) 108 (90  Base) MCG/ACT inhaler INHALE 2 PUFFS INTO THE LUNGS EVERY 6 HOURS AS NEEDED FOR WHEEZING OR SHORTNESS OF BREATH. 09/11/17  Yes Pailyn Bellevue M, PA-C  atorvastatin (LIPITOR) 10 MG tablet Take 0.5 tablets (5 mg total) by mouth daily. 09/11/17  Yes Adisyn Ruscitti, Dionne Bucy, PA-C  busPIRone (BUSPAR) 10 MG tablet Take 2 tablets (20 mg total) by mouth 2 (two) times daily. 09/11/17  Yes Aminta Sakurai, Dionne Bucy, PA-C  carvedilol (COREG) 6.25 MG tablet TAKE 1 TABLET BY MOUTH 2 TIMES DAILY WITH A MEAL 09/11/17  Yes Harel Repetto M, PA-C  cetirizine (ZYRTEC) 10 MG tablet TAKE 1 TABLET BY MOUTH DAILY. 08/26/16  Yes Funches, Josalyn, MD  cyanocobalamin 1000 MCG tablet Take 1 tablet (1,000 mcg total) by mouth daily. 07/11/16  Yes Funches, Adriana Mccallum, MD  ferrous sulfate 325 (65 FE) MG tablet Take 1 tablet (325 mg total) by mouth 2 (two) times daily with a meal. 09/11/17  Yes Chezney Huether M, PA-C  gabapentin (NEURONTIN) 300 MG capsule Take 1 capsule (300 mg total) by mouth 2 (two) times daily. 09/11/17  Yes Freeman Caldron M, PA-C  omeprazole (PRILOSEC) 20 MG capsule Take 1 capsule (20 mg total) by mouth 2 (two) times daily before a meal. 09/11/17  Yes Rosalie Buenaventura M, PA-C  traMADol (ULTRAM) 50 MG tablet Take  1 tablet (50 mg total) by mouth every 12 (twelve) hours as needed. 04/25/17  Yes Ladell Pier, MD  traZODone (DESYREL) 50 MG tablet Take 2 tablets (100 mg total) by mouth at bedtime as needed. for sleep 09/11/17  Yes Shreyas Piatkowski, Dionne Bucy, PA-C     Objective:  EXAM:   Vitals:   09/11/17 0928  BP: (!) 164/96  Pulse: 84  Resp: 18  Temp: 98.2 F (36.8 C)  TempSrc: Oral  SpO2: 100%  Weight: 135 lb (61.2 kg)  Height: 5' 7"  (1.702 m)    General appearance : A&OX3. NAD. Non-toxic-appearing HEENT: Atraumatic and Normocephalic.  PERRLA. EOM intact.  Missing teeth Neck: supple, no JVD. No cervical lymphadenopathy. No thyromegaly Chest/Lungs:  Breathing-non-labored, Good air entry bilaterally, breath sounds normal  without rales, rhonchi, or wheezing  CVS: S1 S2 regular, no murmurs, gallops, rubs  Abdomen: Bowel sounds present, Non tender and not distended with no gaurding, rigidity or rebound. Extremities: Bilateral Lower Ext shows no edema, both legs are warm to touch with = pulse throughout Neurology:  CN II-XII grossly intact, Non focal.   Psych:  TP linear. J/I WNL. Normal speech. Appropriate eye contact and affect.  Skin:  No Rash  Data Review Lab Results  Component Value Date   HGBA1C 5.20 04/21/2015     Assessment & Plan   1. Major depressive disorder, recurrent severe without psychotic features ([place]) Resume as needed - traZODone (DESYREL) 50 MG tablet; Take 2 tablets (100 mg total) by mouth at bedtime as needed. for sleep  Dispense: 60 tablet; Refill: 11  2. Chronic musculoskeletal pain - gabapentin (NEURONTIN) 300 MG capsule; Take 1 capsule (300 mg total) by mouth 2 (two) times daily.  Dispense: 60 capsule; Refill: 6  3. Iron deficiency anemia, unspecified iron deficiency anemia type Resume - ferrous sulfate 325 (65 FE) MG tablet; Take 1 tablet (325 mg total) by mouth 2 (two) times daily with a meal.  Dispense: 60 tablet; Refill: 3 - CBC with Differential/Platelet  4. Essential hypertension Not controlled-resume meds - carvedilol (COREG) 6.25 MG tablet; TAKE 1 TABLET BY MOUTH 2 TIMES DAILY WITH A MEAL  Dispense: 60 tablet; Refill: 4 - Comprehensive metabolic panel - CBC with Differential/Platelet  5. Chronic anxiety resume - busPIRone (BUSPAR) 10 MG tablet; Take 2 tablets (20 mg total) by mouth 2 (two) times daily.  Dispense: 120 tablet; Refill: 11  6. Chronic obstructive pulmonary disease, unspecified COPD type ([facility]) - albuterol (VENTOLIN HFA) 108 (90 Base) MCG/ACT inhaler; INHALE 2 PUFFS INTO THE LUNGS EVERY 6 HOURS AS NEEDED FOR WHEEZING OR SHORTNESS OF BREATH.  Dispense: 18 g; Refill: 6  7. Gastroesophageal reflux disease, esophagitis presence not specified - omeprazole  (PRILOSEC) 20 MG capsule; Take 1 capsule (20 mg total) by mouth 2 (two) times daily before a meal.  Dispense: 60 capsule; Refill: 5  8. Hyperlipidemia, unspecified hyperlipidemia type - atorvastatin (LIPITOR) 10 MG tablet; Take 0.5 tablets (5 mg total) by mouth daily.  Dispense: 30 tablet; Refill: 3 - Comprehensive metabolic panel  9. Grief reaction Met with Christa See and was offered grief resources, suicide support groups.     Patient have been counseled extensively about nutrition and exercise  Return in about 2 months (around 11/11/2017) for Dr Wynetta Emery; f/up medical conditions and htn.  The patient was given clear instructions to go to ER or return to medical center if symptoms don't improve, worsen or new problems develop. The patient verbalized understanding. The patient was told  to call to get lab results if they haven't heard anything in the next week.     Freeman Caldron, PA-C [facility] [place], [place]   09/11/2017, 9:39 AM

## 2017-09-12 LAB — COMPREHENSIVE METABOLIC PANEL
ALBUMIN: 4.2 g/dL (ref 3.5–5.5)
ALT: 83 IU/L — ABNORMAL HIGH (ref 0–32)
AST: 218 IU/L — ABNORMAL HIGH (ref 0–40)
Albumin/Globulin Ratio: 1.3 (ref 1.2–2.2)
Alkaline Phosphatase: 95 IU/L (ref 39–117)
BILIRUBIN TOTAL: 0.3 mg/dL (ref 0.0–1.2)
BUN / CREAT RATIO: 11 (ref 9–23)
BUN: 16 mg/dL (ref 6–24)
CO2: 17 mmol/L — ABNORMAL LOW (ref 20–29)
Calcium: 9.4 mg/dL (ref 8.7–10.2)
Chloride: 103 mmol/L (ref 96–106)
Creatinine, Ser: 1.47 mg/dL — ABNORMAL HIGH (ref 0.57–1.00)
GFR calc Af Amer: 45 mL/min/{1.73_m2} — ABNORMAL LOW (ref >59)
GFR calc non Af Amer: 39 mL/min/{1.73_m2} — ABNORMAL LOW (ref >59)
GLUCOSE: 82 mg/dL (ref 65–99)
Globulin, Total: 3.3 g/dL (ref 1.5–4.5)
Potassium: 4.4 mmol/L (ref 3.5–5.2)
Sodium: 137 mmol/L (ref 134–144)
TOTAL PROTEIN: 7.5 g/dL (ref 6.0–8.5)

## 2017-09-12 LAB — CBC WITH DIFFERENTIAL/PLATELET
BASOS ABS: 0.1 10*3/uL (ref 0.0–0.2)
BASOS: 2 %
EOS (ABSOLUTE): 0.1 10*3/uL (ref 0.0–0.4)
Eos: 3 %
HEMOGLOBIN: 11.6 g/dL (ref 11.1–15.9)
Hematocrit: 35.4 % (ref 34.0–46.6)
Immature Grans (Abs): 0 10*3/uL (ref 0.0–0.1)
Immature Granulocytes: 0 %
LYMPHS ABS: 1.2 10*3/uL (ref 0.7–3.1)
Lymphs: 31 %
MCH: 32.4 pg (ref 26.6–33.0)
MCHC: 32.8 g/dL (ref 31.5–35.7)
MCV: 99 fL — ABNORMAL HIGH (ref 79–97)
MONOCYTES: 11 %
Monocytes Absolute: 0.4 10*3/uL (ref 0.1–0.9)
NEUTROS ABS: 2.1 10*3/uL (ref 1.4–7.0)
Neutrophils: 53 %
Platelets: 226 10*3/uL (ref 150–450)
RBC: 3.58 x10E6/uL — AB (ref 3.77–5.28)
RDW: 15.4 % (ref 12.3–15.4)
WBC: 3.9 10*3/uL (ref 3.4–10.8)

## 2017-09-12 NOTE — BH Specialist Note (Signed)
Integrated Behavioral Health Follow Up Visit  MRN: [id number] Name: [name]  Number of [place] Clinician visits: 1/6 Session Start time: 12:00 PM  Session End time: 12:30 PM Total time: 30 minutes  Type of Service: [place] Interpretor:No. Interpretor Name and Language: N/A  SUBJECTIVE: [name] is a 57 y.o. female accompanied by self Patient was referred by Weyman [name] for anxiety, depression, and grief support resources. Patient reports the following symptoms/concerns: feelings of sadness and worry, decreased pleasure in doing things, difficulty sleeping, low energy, decreased concentration, irritability, and hx of suicidal ideations Duration of problem: 5 months; Severity of problem: moderate  OBJECTIVE: Mood: Anxious and Depressed and Affect: Tearful Risk of harm to self or others: No plan to harm self or others Pt reports hx of SI; however, currently denies SI/HI/AVH Crisis resources were provided  LIFE CONTEXT: Family and Social: Pt receives support from family, including, adult daughter and mother. She attends church when able School/Work: Pt is unemployed. She has a pending disability and widow benefits cases Self-Care: Pt has hx of substance use (marijuana, alcohol, and cocaine) She reports a decrease in cigarette and alcohol use Life Changes: Pt is grieving the loss of her adult son. Since then, pt has not managed her ongoing medical conditions by not scheduling appointments and non-compliance with prescribed medications.   GOALS ADDRESSED: Patient will: 1.  Reduce symptoms of: anxiety and depression  2.  Increase knowledge and/or ability of: coping skills  3.  Demonstrate ability to: Increase motivation to adhere to plan of care and Begin healthy grieving over loss  INTERVENTIONS: Interventions utilized:  Veterinary surgeon, Supportive Counseling, Psychoeducation and/or Health Education and Link  to [employer] Standardized Assessments completed: GAD-7 and PHQ 2&9 with C-SSRS  ASSESSMENT: Patient currently experiencing anxiety and depression triggered by difficulty managing ongoing medical conditions and grieving the loss of her adult son. She reports feelings of sadness and worry, decreased pleasure in doing things, difficulty sleeping, low energy, decreased concentration, irritability, and hx of suicidal ideations. LCSWA inquired about protective factors, which pt was successful in identifying. Safety plan was discussed and crisis intervention resources were provided. Pt denies any SI/HI/AVH.  Patient may benefit from psychoeducation, psychotherapy, and medication management. [place] educated pt on stages of grief and provided support and encouragement. Pt reports decrease in substance use and is open to re-initiating medication management through PCP. Supportive resources for grief support were provided to patient.  PLAN: 1. Follow up with behavioral health clinician on : Pt was encouraged to contact LCSWA if symptoms worsen or fail to improve to schedule behavioral appointments at [facility]. 2. Behavioral recommendations: LCSWA recommends that pt apply healthy coping skills discussed, comply with medication management, and utilize provided resources. Pt is encouraged to schedule follow up appointment with LCSWA 3. Referral(s): [employer] (In Clinic), Community Resources:  Transportation and Grief Counseling 4. "From scale of 1-10, how likely are you to follow plan?": 10/10  Rebekah [place], LCSW 09/12/17 3:04 PM

## 2017-09-15 ENCOUNTER — Telehealth (INDEPENDENT_AMBULATORY_CARE_PROVIDER_SITE_OTHER): Payer: Self-pay | Admitting: *Deleted

## 2017-09-15 NOTE — Telephone Encounter (Signed)
-----   Message from [place] Donovan, [place] sent at 09/15/2017 11:45 AM EDT ----- Please call patient.  Her kidney and liver function is abnormal.  Take all meds as directed.  Do NOT drink alcohol or take products with ibuprofen or tylenol.  Drink water and eat a healthy diet and make an appointment for 1 month for [place] to recheck these things.  Thanks, Freeman Caldron, PA-C

## 2017-09-15 NOTE — Telephone Encounter (Signed)
MA unable to reach patient due to ringing continuously and disconnecting. Please inform patient of kidney and liver function being impaired and needing to NOT drink or take ibuprofen/tylenol. Patient should drink WATER and take medications as prescribed. Keep follow up appointment to recheck levels.

## 2017-10-29 ENCOUNTER — Emergency Department (HOSPITAL_COMMUNITY): Payer: Self-pay

## 2017-10-29 ENCOUNTER — Emergency Department (HOSPITAL_COMMUNITY)
Admission: EM | Admit: 2017-10-29 | Discharge: 2017-10-29 | Disposition: A | Discharge status: Home / Self Care | Payer: Self-pay | Attending: Emergency Medicine | Admitting: Emergency Medicine

## 2017-10-29 ENCOUNTER — Other Ambulatory Visit: Payer: Self-pay

## 2017-10-29 ENCOUNTER — Encounter (HOSPITAL_COMMUNITY): Payer: Self-pay

## 2017-10-29 DIAGNOSIS — N183 Chronic kidney disease, stage 3 (moderate): Secondary | ICD-10-CM | POA: Insufficient documentation

## 2017-10-29 DIAGNOSIS — F17210 Nicotine dependence, cigarettes, uncomplicated: Secondary | ICD-10-CM | POA: Insufficient documentation

## 2017-10-29 DIAGNOSIS — Z79899 Other long term (current) drug therapy: Secondary | ICD-10-CM | POA: Insufficient documentation

## 2017-10-29 DIAGNOSIS — I10 Essential (primary) hypertension: Secondary | ICD-10-CM

## 2017-10-29 DIAGNOSIS — F10120 Alcohol abuse with intoxication, uncomplicated: Secondary | ICD-10-CM | POA: Insufficient documentation

## 2017-10-29 DIAGNOSIS — Y908 Blood alcohol level of 240 mg/100 ml or more: Secondary | ICD-10-CM | POA: Insufficient documentation

## 2017-10-29 DIAGNOSIS — R0789 Other chest pain: Secondary | ICD-10-CM | POA: Insufficient documentation

## 2017-10-29 DIAGNOSIS — F411 Generalized anxiety disorder: Secondary | ICD-10-CM | POA: Insufficient documentation

## 2017-10-29 DIAGNOSIS — J449 Chronic obstructive pulmonary disease, unspecified: Secondary | ICD-10-CM | POA: Insufficient documentation

## 2017-10-29 DIAGNOSIS — R45851 Suicidal ideations: Secondary | ICD-10-CM | POA: Insufficient documentation

## 2017-10-29 DIAGNOSIS — F1010 Alcohol abuse, uncomplicated: Secondary | ICD-10-CM

## 2017-10-29 DIAGNOSIS — E1122 Type 2 diabetes mellitus with diabetic chronic kidney disease: Secondary | ICD-10-CM | POA: Insufficient documentation

## 2017-10-29 DIAGNOSIS — I129 Hypertensive chronic kidney disease with stage 1 through stage 4 chronic kidney disease, or unspecified chronic kidney disease: Secondary | ICD-10-CM | POA: Insufficient documentation

## 2017-10-29 DIAGNOSIS — F419 Anxiety disorder, unspecified: Secondary | ICD-10-CM

## 2017-10-29 LAB — RAPID URINE DRUG SCREEN, HOSP PERFORMED
AMPHETAMINES: NOT DETECTED
BARBITURATES: NOT DETECTED
BENZODIAZEPINES: NOT DETECTED
COCAINE: POSITIVE — AB
OPIATES: NOT DETECTED
TETRAHYDROCANNABINOL: NOT DETECTED

## 2017-10-29 LAB — CBC
HCT: 31.9 % — ABNORMAL LOW (ref 36.0–46.0)
HEMOGLOBIN: 10.7 g/dL — AB (ref 12.0–15.0)
MCH: 32.7 pg (ref 26.0–34.0)
MCHC: 33.5 g/dL (ref 30.0–36.0)
MCV: 97.6 fL (ref 78.0–100.0)
Platelets: 202 10*3/uL (ref 150–400)
RBC: 3.27 MIL/uL — AB (ref 3.87–5.11)
RDW: 15.9 % — ABNORMAL HIGH (ref 11.5–15.5)
WBC: 4.4 10*3/uL (ref 4.0–10.5)

## 2017-10-29 LAB — COMPREHENSIVE METABOLIC PANEL
ALBUMIN: 3.5 g/dL (ref 3.5–5.0)
ALT: 93 U/L — AB (ref 0–44)
AST: 274 U/L — AB (ref 15–41)
Alkaline Phosphatase: 93 U/L (ref 38–126)
Anion gap: 14 (ref 5–15)
BUN: 16 mg/dL (ref 6–20)
CHLORIDE: 98 mmol/L (ref 98–111)
CO2: 18 mmol/L — ABNORMAL LOW (ref 22–32)
CREATININE: 1.49 mg/dL — AB (ref 0.44–1.00)
Calcium: 8.7 mg/dL — ABNORMAL LOW (ref 8.9–10.3)
GFR calc Af Amer: 44 mL/min — ABNORMAL LOW (ref >60)
GFR, EST NON AFRICAN AMERICAN: 38 mL/min — AB (ref >60)
GLUCOSE: 94 mg/dL (ref 70–99)
POTASSIUM: 4.4 mmol/L (ref 3.5–5.1)
Sodium: 130 mmol/L — ABNORMAL LOW (ref 135–145)
Total Bilirubin: 0.9 mg/dL (ref 0.3–1.2)
Total Protein: 7.6 g/dL (ref 6.5–8.1)

## 2017-10-29 LAB — SALICYLATE LEVEL: Salicylate Lvl: <7 mg/dL (ref 2.8–30.0)

## 2017-10-29 LAB — I-STAT TROPONIN, ED: TROPONIN I, POC: 0 ng/mL (ref 0.00–0.08)

## 2017-10-29 LAB — ETHANOL: ALCOHOL ETHYL (B): 290 mg/dL — AB (ref <10)

## 2017-10-29 LAB — ACETAMINOPHEN LEVEL: Acetaminophen (Tylenol), Serum: <10 ug/mL — ABNORMAL LOW (ref 10–30)

## 2017-10-29 MED ORDER — BUSPIRONE HCL 10 MG PO TABS: 20.0000 mg | ORAL_TABLET | Freq: Two times a day (BID) | ORAL | 0 refills | Status: AC

## 2017-10-29 MED ORDER — CARVEDILOL 6.25 MG PO TABS: ORAL_TABLET | ORAL | 0 refills | Status: DC
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
## 2017-10-29 NOTE — ED Notes (Addendum)
Pt changed into purple scrubs and belongings placed in [place] #[facility]. Security notified for wanding. Staffing notified for sitter.
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
## 2017-10-29 NOTE — ED Notes (Signed)
Patient transported to X-ray 

## 2017-10-29 NOTE — ED Notes (Signed)
ED Provider at bedside. 
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
## 2017-10-29 NOTE — Discharge Instructions (Addendum)
Continue home medication as previously prescribed.  Return to the emergency department if symptoms worsen, suicidal thoughts, suicidal thoughts with plan, thoughts of harming others or other concerning symptoms.  Continue taking home medications as previously prescribed.  Follow-up with your doctor as soon as you are able for reevaluation, preferably within next 3 days. See attached resources for assistance with drug and alcohol dependence.

## 2017-10-29 NOTE — ED Notes (Signed)
Janit Bern, NP notified for TTS.

## 2018-04-01 ENCOUNTER — Encounter (HOSPITAL_COMMUNITY): Payer: Self-pay | Admitting: *Deleted

## 2018-04-01 ENCOUNTER — Other Ambulatory Visit: Payer: Self-pay

## 2018-04-01 ENCOUNTER — Emergency Department (HOSPITAL_COMMUNITY): Payer: Self-pay

## 2018-04-01 ENCOUNTER — Emergency Department (HOSPITAL_COMMUNITY)
Admission: EM | Admit: 2018-04-01 | Discharge: 2018-04-02 | Disposition: A | Discharge status: Home / Self Care | Payer: Self-pay | Attending: Emergency Medicine | Admitting: Emergency Medicine

## 2018-04-01 DIAGNOSIS — F17210 Nicotine dependence, cigarettes, uncomplicated: Secondary | ICD-10-CM | POA: Insufficient documentation

## 2018-04-01 DIAGNOSIS — M5441 Lumbago with sciatica, right side: Secondary | ICD-10-CM | POA: Insufficient documentation

## 2018-04-01 DIAGNOSIS — N183 Chronic kidney disease, stage 3 (moderate): Secondary | ICD-10-CM | POA: Insufficient documentation

## 2018-04-01 DIAGNOSIS — F1210 Cannabis abuse, uncomplicated: Secondary | ICD-10-CM | POA: Insufficient documentation

## 2018-04-01 DIAGNOSIS — F329 Major depressive disorder, single episode, unspecified: Secondary | ICD-10-CM | POA: Insufficient documentation

## 2018-04-01 DIAGNOSIS — Z590 Homelessness: Secondary | ICD-10-CM | POA: Insufficient documentation

## 2018-04-01 DIAGNOSIS — F419 Anxiety disorder, unspecified: Secondary | ICD-10-CM | POA: Insufficient documentation

## 2018-04-01 DIAGNOSIS — I129 Hypertensive chronic kidney disease with stage 1 through stage 4 chronic kidney disease, or unspecified chronic kidney disease: Secondary | ICD-10-CM | POA: Insufficient documentation

## 2018-04-01 DIAGNOSIS — F1410 Cocaine abuse, uncomplicated: Secondary | ICD-10-CM | POA: Insufficient documentation

## 2018-04-01 DIAGNOSIS — Z859 Personal history of malignant neoplasm, unspecified: Secondary | ICD-10-CM | POA: Insufficient documentation

## 2018-04-01 DIAGNOSIS — F1010 Alcohol abuse, uncomplicated: Secondary | ICD-10-CM | POA: Insufficient documentation

## 2018-04-01 DIAGNOSIS — J45909 Unspecified asthma, uncomplicated: Secondary | ICD-10-CM | POA: Insufficient documentation

## 2018-04-01 MED ORDER — ONDANSETRON 4 MG PO TBDP
4.0000 mg | ORAL_TABLET | Freq: Once | ORAL | Status: AC
  Administered 2018-04-01: 4 mg via ORAL
  Filled 2018-04-01: qty 1

## 2018-04-01 MED ORDER — METHYLPREDNISOLONE SODIUM SUCC 125 MG IJ SOLR
125.0000 mg | Freq: Once | INTRAMUSCULAR | Status: AC
  Administered 2018-04-01: 125 mg via INTRAVENOUS
  Filled 2018-04-01: qty 2

## 2018-04-01 MED ORDER — KETOROLAC TROMETHAMINE 60 MG/2ML IM SOLN
30.0000 mg | Freq: Once | INTRAMUSCULAR | Status: AC
  Administered 2018-04-01: 30 mg via INTRAMUSCULAR
  Filled 2018-04-01: qty 2
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
## 2018-04-01 NOTE — ED Triage Notes (Addendum)
EMS states pt was crawling on carpet 4 days ago and hurt back when she raised up. Pain continues to increase, pt is intoxicated. 164/92-100-18-CBG 97 97% RA Pt told EMS she "feels like your poo is in my butt, I feel nauseas" During triage she admits to drinking one bottle of wine tonight.

## 2018-04-01 NOTE — ED Notes (Signed)
Patient called for vital recheck with no answer. 

## 2018-04-01 NOTE — ED Notes (Signed)
Patient states that she was laying carpet with her husband x4 days ago and bent over to move a coffee table and hurt her back, states 10/10 pain. Also states a hx of sciatica I both legs.

## 2018-04-02 MED ORDER — IBUPROFEN 800 MG PO TABS: 800.0000 mg | ORAL_TABLET | Freq: Three times a day (TID) | ORAL | 0 refills | Status: DC

## 2018-04-02 MED ORDER — PREDNISONE 20 MG PO TABS: ORAL_TABLET | ORAL | 0 refills | Status: DC

## 2018-04-02 MED ORDER — PANTOPRAZOLE SODIUM 20 MG PO TBEC: 20.0000 mg | DELAYED_RELEASE_TABLET | Freq: Every day | ORAL | 0 refills | Status: DC

## 2018-04-02 NOTE — ED Notes (Signed)
Patient was given a sandwich, some water and crackers and encouraged to eat.

## 2018-04-02 NOTE — ED Notes (Signed)
Patient ate some crackers and a sandwich, no complaints of N/V. Patient ambulated down the hall and complained of pain in her legs, stated "it's my sciatica."

## 2018-07-31 ENCOUNTER — Encounter (HOSPITAL_COMMUNITY): Payer: Self-pay | Admitting: *Deleted

## 2018-07-31 ENCOUNTER — Other Ambulatory Visit: Payer: Self-pay

## 2018-07-31 ENCOUNTER — Emergency Department (HOSPITAL_COMMUNITY): Payer: Self-pay

## 2018-07-31 ENCOUNTER — Inpatient Hospital Stay (HOSPITAL_COMMUNITY)
Admission: EM | Admit: 2018-07-31 | Discharge: 2018-08-04 | DRG: 854 | Disposition: A | Discharge status: Home / Self Care | Payer: Self-pay | Attending: Family Medicine | Admitting: Family Medicine

## 2018-07-31 DIAGNOSIS — I10 Essential (primary) hypertension: Secondary | ICD-10-CM

## 2018-07-31 DIAGNOSIS — Z833 Family history of diabetes mellitus: Secondary | ICD-10-CM

## 2018-07-31 DIAGNOSIS — D509 Iron deficiency anemia, unspecified: Secondary | ICD-10-CM | POA: Diagnosis present

## 2018-07-31 DIAGNOSIS — R1013 Epigastric pain: Secondary | ICD-10-CM

## 2018-07-31 DIAGNOSIS — N136 Pyonephrosis: Secondary | ICD-10-CM | POA: Diagnosis present

## 2018-07-31 DIAGNOSIS — F17210 Nicotine dependence, cigarettes, uncomplicated: Secondary | ICD-10-CM | POA: Diagnosis present

## 2018-07-31 DIAGNOSIS — Z79899 Other long term (current) drug therapy: Secondary | ICD-10-CM

## 2018-07-31 DIAGNOSIS — F329 Major depressive disorder, single episode, unspecified: Secondary | ICD-10-CM | POA: Diagnosis present

## 2018-07-31 DIAGNOSIS — N179 Acute kidney failure, unspecified: Secondary | ICD-10-CM | POA: Diagnosis present

## 2018-07-31 DIAGNOSIS — N183 Chronic kidney disease, stage 3 (moderate): Secondary | ICD-10-CM | POA: Diagnosis present

## 2018-07-31 DIAGNOSIS — A419 Sepsis, unspecified organism: Principal | ICD-10-CM | POA: Diagnosis present

## 2018-07-31 DIAGNOSIS — Z791 Long term (current) use of non-steroidal anti-inflammatories (NSAID): Secondary | ICD-10-CM

## 2018-07-31 DIAGNOSIS — J45909 Unspecified asthma, uncomplicated: Secondary | ICD-10-CM | POA: Diagnosis present

## 2018-07-31 DIAGNOSIS — D638 Anemia in other chronic diseases classified elsewhere: Secondary | ICD-10-CM | POA: Diagnosis present

## 2018-07-31 DIAGNOSIS — Z79891 Long term (current) use of opiate analgesic: Secondary | ICD-10-CM

## 2018-07-31 DIAGNOSIS — K219 Gastro-esophageal reflux disease without esophagitis: Secondary | ICD-10-CM | POA: Diagnosis present

## 2018-07-31 DIAGNOSIS — N138 Other obstructive and reflux uropathy: Secondary | ICD-10-CM | POA: Diagnosis present

## 2018-07-31 DIAGNOSIS — Z888 Allergy status to other drugs, medicaments and biological substances status: Secondary | ICD-10-CM

## 2018-07-31 DIAGNOSIS — I129 Hypertensive chronic kidney disease with stage 1 through stage 4 chronic kidney disease, or unspecified chronic kidney disease: Secondary | ICD-10-CM | POA: Diagnosis present

## 2018-07-31 DIAGNOSIS — F1010 Alcohol abuse, uncomplicated: Secondary | ICD-10-CM | POA: Diagnosis present

## 2018-07-31 DIAGNOSIS — E875 Hyperkalemia: Secondary | ICD-10-CM | POA: Diagnosis not present

## 2018-07-31 DIAGNOSIS — Z20828 Contact with and (suspected) exposure to other viral communicable diseases: Secondary | ICD-10-CM | POA: Diagnosis present

## 2018-07-31 DIAGNOSIS — B9620 Unspecified Escherichia coli [E. coli] as the cause of diseases classified elsewhere: Secondary | ICD-10-CM | POA: Diagnosis present

## 2018-07-31 DIAGNOSIS — R509 Fever, unspecified: Secondary | ICD-10-CM

## 2018-07-31 DIAGNOSIS — N23 Unspecified renal colic: Secondary | ICD-10-CM

## 2018-07-31 DIAGNOSIS — B961 Klebsiella pneumoniae [K. pneumoniae] as the cause of diseases classified elsewhere: Secondary | ICD-10-CM | POA: Diagnosis present

## 2018-07-31 DIAGNOSIS — Z7952 Long term (current) use of systemic steroids: Secondary | ICD-10-CM

## 2018-07-31 LAB — URINALYSIS, ROUTINE W REFLEX MICROSCOPIC
Bilirubin Urine: NEGATIVE
Glucose, UA: NEGATIVE mg/dL
Hgb urine dipstick: NEGATIVE
Ketones, ur: NEGATIVE mg/dL
Nitrite: POSITIVE — AB
Protein, ur: 30 mg/dL — AB
Specific Gravity, Urine: 1.004 — ABNORMAL LOW (ref 1.005–1.030)
pH: 7 (ref 5.0–8.0)

## 2018-07-31 LAB — COMPREHENSIVE METABOLIC PANEL
ALT: 19 U/L (ref 0–44)
AST: 31 U/L (ref 15–41)
Albumin: 3 g/dL — ABNORMAL LOW (ref 3.5–5.0)
Alkaline Phosphatase: 91 U/L (ref 38–126)
Anion gap: 14 (ref 5–15)
BUN: 30 mg/dL — ABNORMAL HIGH (ref 6–20)
CO2: 15 mmol/L — ABNORMAL LOW (ref 22–32)
Calcium: 8.5 mg/dL — ABNORMAL LOW (ref 8.9–10.3)
Chloride: 102 mmol/L (ref 98–111)
Creatinine, Ser: 2.29 mg/dL — ABNORMAL HIGH (ref 0.44–1.00)
GFR calc Af Amer: 27 mL/min — ABNORMAL LOW (ref >60)
GFR calc non Af Amer: 23 mL/min — ABNORMAL LOW (ref >60)
Glucose, Bld: 83 mg/dL (ref 70–99)
Potassium: 4.3 mmol/L (ref 3.5–5.1)
Sodium: 131 mmol/L — ABNORMAL LOW (ref 135–145)
Total Bilirubin: 0.3 mg/dL (ref 0.3–1.2)
Total Protein: 7.8 g/dL (ref 6.5–8.1)

## 2018-07-31 LAB — RAPID URINE DRUG SCREEN, HOSP PERFORMED
Amphetamines: NOT DETECTED
Barbiturates: NOT DETECTED
Benzodiazepines: NOT DETECTED
Cocaine: POSITIVE — AB
Opiates: POSITIVE — AB
Tetrahydrocannabinol: NOT DETECTED

## 2018-07-31 LAB — CBC
HCT: 25.2 % — ABNORMAL LOW (ref 36.0–46.0)
Hemoglobin: 8.1 g/dL — ABNORMAL LOW (ref 12.0–15.0)
MCH: 28.9 pg (ref 26.0–34.0)
MCHC: 32.1 g/dL (ref 30.0–36.0)
MCV: 90 fL (ref 80.0–100.0)
Platelets: 375 10*3/uL (ref 150–400)
RBC: 2.8 MIL/uL — ABNORMAL LOW (ref 3.87–5.11)
RDW: 16.7 % — ABNORMAL HIGH (ref 11.5–15.5)
WBC: 14.8 10*3/uL — ABNORMAL HIGH (ref 4.0–10.5)
nRBC: 0 % (ref 0.0–0.2)

## 2018-07-31 LAB — GLUCOSE, CAPILLARY
Glucose-Capillary: 112 mg/dL — ABNORMAL HIGH (ref 70–99)
Glucose-Capillary: 97 mg/dL (ref 70–99)

## 2018-07-31 LAB — TROPONIN I: Troponin I: <0.03 ng/mL (ref <0.03)

## 2018-07-31 LAB — LIPASE, BLOOD: Lipase: 38 U/L (ref 11–51)

## 2018-07-31 LAB — SARS CORONAVIRUS 2 BY RT PCR (HOSPITAL ORDER, PERFORMED IN ~~LOC~~ HOSPITAL LAB): SARS Coronavirus 2: NEGATIVE

## 2018-07-31 LAB — PROTIME-INR
INR: 1 (ref 0.8–1.2)
Prothrombin Time: 13 seconds (ref 11.4–15.2)

## 2018-07-31 LAB — SALICYLATE LEVEL: Salicylate Lvl: <7 mg/dL (ref 2.8–30.0)

## 2018-07-31 LAB — LACTIC ACID, PLASMA: Lactic Acid, Venous: 1.9 mmol/L (ref 0.5–1.9)

## 2018-07-31 MED ORDER — ACETAMINOPHEN 325 MG PO TABS
650.0000 mg | ORAL_TABLET | Freq: Four times a day (QID) | ORAL | Status: DC | PRN
  Administered 2018-08-01: 650 mg via ORAL
  Filled 2018-07-31: qty 2

## 2018-07-31 MED ORDER — ENOXAPARIN SODIUM 40 MG/0.4ML ~~LOC~~ SOLN
40.0000 mg | SUBCUTANEOUS | Status: DC
  Administered 2018-07-31: 40 mg via SUBCUTANEOUS
  Filled 2018-07-31: qty 0.4

## 2018-07-31 MED ORDER — SODIUM CHLORIDE 0.9% FLUSH
3.0000 mL | Freq: Once | INTRAVENOUS | Status: AC
  Administered 2018-07-31: 3 mL via INTRAVENOUS

## 2018-07-31 MED ORDER — MORPHINE SULFATE (PF) 4 MG/ML IV SOLN
4.0000 mg | Freq: Once | INTRAVENOUS | Status: AC
  Administered 2018-07-31: 4 mg via INTRAVENOUS
  Filled 2018-07-31: qty 1

## 2018-07-31 MED ORDER — SODIUM CHLORIDE 0.9 % IV SOLN
1.0000 g | INTRAVENOUS | Status: DC
  Administered 2018-07-31: 1 g via INTRAVENOUS
  Filled 2018-07-31 (×2): qty 10

## 2018-07-31 MED ORDER — SODIUM CHLORIDE 0.9 % IV SOLN
Freq: Once | INTRAVENOUS | Status: AC
  Administered 2018-07-31: 18:00:00 via INTRAVENOUS

## 2018-07-31 MED ORDER — LORATADINE 10 MG PO TABS
10.0000 mg | ORAL_TABLET | Freq: Every day | ORAL | Status: DC
  Administered 2018-08-02 – 2018-08-04 (×3): 10 mg via ORAL
  Filled 2018-07-31 (×3): qty 1

## 2018-07-31 MED ORDER — SODIUM CHLORIDE 0.9 % IV SOLN: 1.0000 g | Freq: Once | INTRAVENOUS | Status: DC

## 2018-07-31 MED ORDER — SODIUM CHLORIDE 0.9 % IV BOLUS
1000.0000 mL | Freq: Once | INTRAVENOUS | Status: AC
  Administered 2018-07-31: 13:00:00 1000 mL via INTRAVENOUS

## 2018-07-31 MED ORDER — ONDANSETRON HCL 4 MG PO TABS: 4.0000 mg | ORAL_TABLET | Freq: Four times a day (QID) | ORAL | Status: DC | PRN

## 2018-07-31 MED ORDER — SODIUM CHLORIDE 0.9 % IV SOLN
INTRAVENOUS | Status: DC
  Administered 2018-07-31 – 2018-08-02 (×5): via INTRAVENOUS

## 2018-07-31 MED ORDER — HYDROMORPHONE HCL 1 MG/ML IJ SOLN
2.0000 mg | INTRAMUSCULAR | Status: DC | PRN
  Administered 2018-07-31 – 2018-08-03 (×11): 2 mg via INTRAVENOUS
  Filled 2018-07-31 (×11): qty 2

## 2018-07-31 MED ORDER — ENOXAPARIN SODIUM 30 MG/0.3ML ~~LOC~~ SOLN
30.0000 mg | SUBCUTANEOUS | Status: DC
  Administered 2018-08-01 – 2018-08-03 (×3): 30 mg via SUBCUTANEOUS
  Filled 2018-07-31 (×3): qty 0.3

## 2018-07-31 MED ORDER — ONDANSETRON HCL 4 MG/2ML IJ SOLN
4.0000 mg | Freq: Once | INTRAMUSCULAR | Status: AC
  Administered 2018-07-31: 4 mg via INTRAVENOUS
  Filled 2018-07-31: qty 2

## 2018-07-31 MED ORDER — ONDANSETRON HCL 4 MG/2ML IJ SOLN
4.0000 mg | Freq: Four times a day (QID) | INTRAMUSCULAR | Status: DC | PRN
  Administered 2018-08-02: 4 mg via INTRAVENOUS
  Filled 2018-07-31: qty 2

## 2018-07-31 MED ORDER — CARVEDILOL 6.25 MG PO TABS
6.2500 mg | ORAL_TABLET | Freq: Two times a day (BID) | ORAL | Status: DC
  Administered 2018-08-01 – 2018-08-04 (×7): 6.25 mg via ORAL
  Filled 2018-07-31 (×7): qty 1

## 2018-07-31 MED ORDER — ALBUTEROL SULFATE (2.5 MG/3ML) 0.083% IN NEBU
2.5000 mg | INHALATION_SOLUTION | Freq: Four times a day (QID) | RESPIRATORY_TRACT | Status: DC | PRN
  Administered 2018-08-03: 2.5 mg via RESPIRATORY_TRACT
  Filled 2018-07-31: qty 3

## 2018-07-31 MED ORDER — ACETAMINOPHEN 650 MG RE SUPP: 650.0000 mg | Freq: Four times a day (QID) | RECTAL | Status: DC | PRN

## 2018-07-31 NOTE — ED Notes (Signed)
Pt made aware that urine sample is needed.

## 2018-07-31 NOTE — ED Notes (Signed)
Transport has been called to transport patient upstairs.

## 2018-07-31 NOTE — ED Triage Notes (Addendum)
Pt to ED via EMS. Per EMS pt has abdominal pain over the past 3 weeks. On EMS assessment pt abdomen is distended. Pt denies SHOB, denies change in bowel movements.

## 2018-07-31 NOTE — ED Notes (Signed)
Spoke to CT. Patient is next.

## 2018-07-31 NOTE — H&P (Signed)
TRH H&P    Patient Demographics:    [name], is a 58 y.o. female  MRN: [id number]  DOB - [birth year]  Admit Date - 07/31/2018  Referring MD/NP/PA: Lenon Oms  Outpatient Primary MD for the patient is Cynthia Pier, MD  Patient coming from: Home  Chief complaint-left flank pain   HPI:    [name]  is a 58 y.o. female, with a history of alcohol abuse, asthma, depression, hypertension, CKD stage III came to hospital with left-sided flank pain for past 3 weeks.  Patient also complains of nausea and vomiting intermittently.  Patient did get fever of 101 at home.  Denies dysuria but complains of pain in left flank with urination.  She denies chest pain but has been coughing up phlegm.  Also complains of diarrhea.  Patient has been taking Pepto-Bismol for her pain. Denies previous history of stroke Remote history of seizures, which was likely from anxiety as per patient In the ED, CT of the abdomen pelvis showed left hydronephrosis with 0.6 cm stone just below renal pelvis.  Urology was consulted.    Review of systems:    In addition to the HPI above,    All other systems reviewed and are negative.    Past History of the following :    Past Medical History:  Diagnosis Date  . Alcohol abuse   . Allergy   . Anxiety   . Arthritis   . Asthma   . Cancer ([place])   . Cannabis abuse   . Cocaine abuse ([place])   . Depression   . Drug addiction ([place])   . GERD (gastroesophageal reflux disease)   . Homelessness   . Hypertension    pt stated "every once in a while BP will be high but has not been prescribed medication for HTN.   . Seasonal allergies   . 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 CESAREAN SECTION  1989  . FRACTURE SURGERY Left 2011      Social History:      Social  History   Tobacco Use  . Smoking status: Current Every Day Smoker    Packs/day: 1.00    Years: 0.00    Pack years: 0.00    Types: Cigarettes  . Smokeless tobacco: Never Used  Substance Use Topics  . Alcohol use: Yes    Alcohol/week: 7.0 standard drinks    Types: 7 Cans of beer per week       Family History :     Family History  Problem Relation Age of Onset  . Diabetes Father   . Colon cancer Neg Hx       Home Medications:   Prior to Admission medications   Medication Sig Start Date End Date Taking? Authorizing Provider  albuterol (VENTOLIN HFA) 108 (90 Base) MCG/ACT inhaler INHALE 2 PUFFS INTO THE LUNGS EVERY 6 HOURS AS NEEDED FOR WHEEZING OR SHORTNESS OF BREATH. Patient taking  differently: Inhale 2 puffs into the lungs every 6 (six) hours as needed for wheezing or shortness of breath.  09/11/17   Cynthia Donovan, PA-C  atorvastatin (LIPITOR) 10 MG tablet Take 0.5 tablets (5 mg total) by mouth daily. 09/11/17   Cynthia Donovan, PA-C  carvedilol (COREG) 6.25 MG tablet TAKE 1 TABLET BY MOUTH 2 TIMES DAILY WITH A MEAL Patient taking differently: Take 6.25 mg by mouth 2 (two) times daily with a meal.  10/29/17   [name], MD  cetirizine (ZYRTEC) 10 MG tablet TAKE 1 TABLET BY MOUTH DAILY. Patient taking differently: Take 10 mg by mouth daily.  08/26/16   Funches, Cynthia Mccallum, MD  cyanocobalamin 1000 MCG tablet Take 1 tablet (1,000 mcg total) by mouth daily. 07/11/16   Cynthia Nearing, MD  ferrous sulfate 325 (65 FE) MG tablet Take 1 tablet (325 mg total) by mouth 2 (two) times daily with a meal. 09/11/17   McClung, Dionne Bucy, PA-C  gabapentin (NEURONTIN) 300 MG capsule Take 1 capsule (300 mg total) by mouth 2 (two) times daily. 09/11/17   Cynthia Donovan, PA-C  ibuprofen (ADVIL,MOTRIN) 800 MG tablet Take 1 tablet (800 mg total) by mouth 3 (three) times daily. 04/02/18   Mesner, Corene Cornea, MD  omeprazole (PRILOSEC) 20 MG capsule Take 1 capsule (20 mg total) by mouth 2 (two) times  daily before a meal. 09/11/17   McClung, Dionne Bucy, PA-C  pantoprazole (PROTONIX) 20 MG tablet Take 1 tablet (20 mg total) by mouth daily. 04/02/18   Mesner, Corene Cornea, MD  predniSONE (DELTASONE) 20 MG tablet 3 tabs po daily x 3 days, then 2 tabs x 3 days, then 1.5 tabs x 3 days, then 1 tab x 3 days, then 0.5 tabs x 3 days 04/02/18   Mesner, Corene Cornea, MD  traMADol (ULTRAM) 50 MG tablet Take 1 tablet (50 mg total) by mouth every 12 (twelve) hours as needed. 04/25/17   Cynthia Pier, MD  traZODone (DESYREL) 50 MG tablet Take 2 tablets (100 mg total) by mouth at bedtime as needed. for sleep 09/11/17   Cynthia Donovan, PA-C     Allergies:     Allergies  Allergen Reactions  . Lisinopril Other (See Comments)    hyperkalemia     Physical Exam:   Vitals  Blood pressure (!) 158/79, pulse 92, temperature (!) 100.5 F (38.1 C), temperature source Oral, resp. rate 15, height 5\' 7"  (1.702 m), weight 64.4 kg, SpO2 98 %.  1.  General:  Appears in moderate distress due to  pain in left flank  2. Psychiatric: Alert, oriented x3, intact insight and judgment  3. Neurologic: Cranial nerves II through XII grossly intact, motor strength 5/5 in all extremities  4. HEENMT:  Atraumatic normocephalic, extraocular muscles are intact  5. Respiratory : Clear to auscultation bilaterally  6. Cardiovascular : S1-S2, regular, no murmur auscultated  7. Gastrointestinal:  Abdomen is soft, tenderness noted in left upper quadrant, positive left CVA tenderness.      Data Review:    CBC Recent Labs  Lab 07/31/18 1256  WBC 14.8*  HGB 8.1*  HCT 25.2*  PLT 375  MCV 90.0  MCH 28.9  MCHC 32.1  RDW 16.7*   ------------------------------------------------------------------------------------------------------------------  Results for orders placed or performed during the hospital encounter of 07/31/18 (from the past 48 hour(s))  Lipase, blood     Status: None   Collection Time: 07/31/18 12:56 PM  Result  Value Ref Range   Lipase 38 11 -  51 U/L    Comment: Performed at [facility], [place] [address], [place], [place] [address]  Comprehensive metabolic panel     Status: Abnormal   Collection Time: 07/31/18 12:56 PM  Result Value Ref Range   Sodium 131 (L) 135 - 145 mmol/L   Potassium 4.3 3.5 - 5.1 mmol/L   Chloride 102 98 - 111 mmol/L   CO2 15 (L) 22 - 32 mmol/L   Glucose, Bld 83 70 - 99 mg/dL   BUN 30 (H) 6 - 20 mg/dL   Creatinine, Ser 2.29 (H) 0.44 - 1.00 mg/dL   Calcium 8.5 (L) 8.9 - 10.3 mg/dL   Total Protein 7.8 6.5 - 8.1 g/dL   Albumin 3.0 (L) 3.5 - 5.0 g/dL   AST 31 15 - 41 U/L   ALT 19 0 - 44 U/L   Alkaline Phosphatase 91 38 - 126 U/L   Total Bilirubin 0.3 0.3 - 1.2 mg/dL   GFR calc non Af Amer 23 (L) >60 mL/min   GFR calc Af Amer 27 (L) >60 mL/min   Anion gap 14 5 - 15    Comment: Performed at [facility], [place] [address], [place], [place] [address]  CBC     Status: Abnormal   Collection Time: 07/31/18 12:56 PM  Result Value Ref Range   WBC 14.8 (H) 4.0 - 10.5 K/uL   RBC 2.80 (L) 3.87 - 5.11 MIL/uL   Hemoglobin 8.1 (L) 12.0 - 15.0 g/dL   HCT 25.2 (L) 36.0 - 46.0 %   MCV 90.0 80.0 - 100.0 fL   MCH 28.9 26.0 - 34.0 pg   MCHC 32.1 30.0 - 36.0 g/dL   RDW 16.7 (H) 11.5 - 15.5 %   Platelets 375 150 - 400 K/uL   nRBC 0.0 0.0 - 0.2 %    Comment: Performed at [facility], [place] [address], [place], [place] [address]  Troponin I - ONCE - STAT     Status: None   Collection Time: 07/31/18  1:01 PM  Result Value Ref Range   Troponin I <0.03 <0.03 ng/mL    Comment: Performed at [facility], [place] [address], [place], [place] [address]  Lactic acid, plasma     Status: None   Collection Time: 07/31/18  1:01 PM  Result Value Ref Range   Lactic Acid, Venous 1.9 0.5 - 1.9 mmol/L    Comment: Performed at [facility], [place] [address], [place], [place] [address]  Protime-INR     Status: None    Collection Time: 07/31/18  1:01 PM  Result Value Ref Range   Prothrombin Time 13.0 11.4 - 15.2 seconds   INR 1.0 0.8 - 1.2    Comment: (NOTE) INR goal varies based on device and disease states. Performed at [facility], [place] [address] [place], [place] [address]   Salicylate level     Status: None   Collection Time: 07/31/18  1:01 PM  Result Value Ref Range   Salicylate Lvl <6.4 2.8 - 30.0 mg/dL    Comment: Performed at [facility], [place] [address] [place], [place] [address]  Urinalysis, Routine w reflex microscopic     Status: Abnormal   Collection Time: 07/31/18  5:18 PM  Result Value Ref Range   Color, Urine YELLOW YELLOW   APPearance HAZY (A) CLEAR   Specific Gravity, Urine 1.004 (L) 1.005 - 1.030   pH 7.0 5.0 - 8.0  Glucose, UA NEGATIVE NEGATIVE mg/dL   Hgb urine dipstick NEGATIVE NEGATIVE   Bilirubin Urine NEGATIVE NEGATIVE   Ketones, ur NEGATIVE NEGATIVE mg/dL   Protein, ur 30 (A) NEGATIVE mg/dL   Nitrite POSITIVE (A) NEGATIVE   Leukocytes,Ua LARGE (A) NEGATIVE   RBC / HPF 0-5 0 - 5 RBC/hpf   WBC, UA 6-10 0 - 5 WBC/hpf   Bacteria, UA FEW (A) NONE SEEN   Squamous Epithelial / LPF 0-5 0 - 5    Comment: Performed at [facility], [place] [address] [place], [place] [address]    Chemistries  Recent Labs  Lab 07/31/18 1256  NA 131*  K 4.3  CL 102  CO2 15*  GLUCOSE 83  BUN 30*  CREATININE 2.29*  CALCIUM 8.5*  AST 31  ALT 19  ALKPHOS 91  BILITOT 0.3   ------------------------------------------------------------------------------------------------------------------  ------------------------------------------------------------------------------------------------------------------ GFR: Estimated Creatinine Clearance: 26.4 mL/min (A) (by C-G formula based on SCr of 2.29 mg/dL (H)). Liver Function Tests: Recent Labs  Lab 07/31/18 1256  AST 31  ALT 19  ALKPHOS 91  BILITOT 0.3  PROT 7.8  ALBUMIN 3.0*    Recent Labs  Lab 07/31/18 1256  LIPASE 38   No results for input(s): AMMONIA in the last 168 hours. Coagulation Profile: Recent Labs  Lab 07/31/18 1301  INR 1.0   Cardiac Enzymes: Recent Labs  Lab 07/31/18 1301  TROPONINI <0.03   BNP (last 3 results) No results for input(s): PROBNP in the last 8760 hours. HbA1C: No results for input(s): HGBA1C in the last 72 hours. CBG: No results for input(s): GLUCAP in the last 168 hours. Lipid Profile: No results for input(s): CHOL, HDL, LDLCALC, TRIG, CHOLHDL, LDLDIRECT in the last 72 hours. Thyroid Function Tests: No results for input(s): TSH, T4TOTAL, FREET4, T3FREE, THYROIDAB in the last 72 hours. Anemia Panel: No results for input(s): VITAMINB12, FOLATE, FERRITIN, TIBC, IRON, RETICCTPCT in the last 72 hours.  --------------------------------------------------------------------------------------------------------------- Urine analysis:    Component Value Date/Time   COLORURINE YELLOW 07/31/2018 1718   APPEARANCEUR HAZY (A) 07/31/2018 1718   LABSPEC 1.004 (L) 07/31/2018 1718   PHURINE 7.0 07/31/2018 1718   GLUCOSEU NEGATIVE 07/31/2018 1718   HGBUR NEGATIVE 07/31/2018 1718   BILIRUBINUR NEGATIVE 07/31/2018 1718   KETONESUR NEGATIVE 07/31/2018 1718   PROTEINUR 30 (A) 07/31/2018 1718   UROBILINOGEN 0.2 03/02/2010 2027   NITRITE POSITIVE (A) 07/31/2018 1718   LEUKOCYTESUR LARGE (A) 07/31/2018 1718      Imaging Results:    Ct Abdomen Pelvis Wo Contrast  Result Date: 07/31/2018 CLINICAL DATA:  Left side abdominal pain for 3 weeks. EXAM: CT ABDOMEN AND PELVIS WITHOUT CONTRAST TECHNIQUE: Multidetector CT imaging of the abdomen and pelvis was performed following the standard protocol without IV contrast. COMPARISON:  None. FINDINGS: Lower chest: Dependent atelectasis is identified. There is no pleural or pericardial effusion. Hepatobiliary: Mild fatty infiltration of the liver without focal lesion. Gallbladder and biliary tree  are unremarkable. Pancreas: Unremarkable. No pancreatic ductal dilatation or surrounding inflammatory changes. Spleen: Normal in size without focal abnormality. Adrenals/Urinary Tract: The patient has moderate left hydronephrosis due to a 0.6 cm stone just below the renal pelvis. 2 nonobstructing stones are seen in the left kidney measuring 0.5 cm in the lower pole and 1.1 cm in the upper pole. There is extensive stranding about the left kidney and proximal ureter. The right kidney is ptotic with an abnormal axis of orientation. The pelvis is directed anteriorly. Duplicated right renal collecting system is noted.  Two nonobstructing stones are seen in the right kidney. The larger measures 0.5 cm. Urinary bladder is unremarkable. The adrenal glands appear normal. Stomach/Bowel: Stomach is within normal limits. Appendix appears normal. No evidence of bowel wall thickening, distention, or inflammatory changes. Vascular/Lymphatic: Aortic atherosclerosis. No enlarged abdominal or pelvic lymph nodes. Reproductive: Uterus and bilateral adnexa are unremarkable. Other: The none. Musculoskeletal: No acute or focal abnormality. IMPRESSION: Moderate left hydronephrosis due to a 0.6 cm stone just below the renal pelvis. The patient has 2 additional nonobstructing stone in the left kidney. 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 Two nonobstructing stones are present in the right kidney. Fatty infiltration liver. Atherosclerosis. Electronically Signed   By: Inge Rise M.D.   On: 07/31/2018 16:21   Dg Chest Port 1 View  Result Date: 07/31/2018 CLINICAL DATA:  Chest discomfort EXAM: PORTABLE CHEST 1 VIEW COMPARISON:  10/29/2017 FINDINGS: The heart size and mediastinal contours are within normal limits. Both lungs are clear. The visualized skeletal structures are unremarkable. IMPRESSION: No active disease. Electronically Signed   By: Inez Catalina  M.D.   On: 07/31/2018 14:29    My personal review of EKG: Rhythm NSR, no acute changes noted.   Assessment & Plan:    Active Problems:   * No active hospital problems. *   1. Left hydronephrosis-patient is 0.6 cm stone just below the renal pelvis, urology has been consulted for possible stent placement tonight.  We will continue with supportive care with IV Dilaudid PRN.  Zofran PRN for nausea and vomiting.  2. UTI-patient has abnormal UA with positive nitrate.  Will start IV ceftriaxone 1 g every 24 hours.  Follow urine culture results.  3. 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  Baseline creatinine is around 1.5.  Today creatinine is 2.29.  Ureteral stent placement plan as per urology as above.  4. Hypertension-blood pressure is stable.  Continue Coreg  5. History of asthma-patient has been coughing up phlegm.  SARS-CoV-2 test has been obtained and is currently pending.  Will place under droplet precautions till the test is resulted.  She is not requiring oxygen.  Continue PRN albuterol inhaler.   DVT Prophylaxis-   Lovenox   AM Labs Ordered, also please review Full Orders  Family Communication: Admission, patients condition and plan of care including tests being ordered have been discussed with the patient  who indicate understanding and agree with the plan and Code Status.  Code Status: Full code  Admission status: Inpatient: Based on patients clinical presentation and evaluation of above clinical data, I have made determination that patient meets Inpatient criteria at this time.  Time spent in minutes : 60 minutes   Oswald Hillock M.D on 07/31/2018 at 5:53 PM

## 2018-07-31 NOTE — ED Notes (Signed)
Pt to CT

## 2018-07-31 NOTE — Consult Note (Addendum)
H&P Physician requesting consult: Deno Etienne, DO  Chief Complaint: Left ureteral calculus  History of Present Illness: 58 year old female with a 3-week history of left-sided flank pain.  It is severe today.  This prompted a CT scan of the abdomen and pelvis which revealed a left-sided proximal ureteral calculus with upstream hydronephrosis.  There was some perinephric stranding.  Urinalysis revealed large leukocyte, positive nitrite, few bacteria, 6-10 WBCs.  She is noted to have acute renal insufficiency with a creatinine of 2.29.  Mild leukocytosis of 14.8.  She continues to have severe left-sided pain.  Past Medical History:  Diagnosis Date  . Alcohol abuse   . Allergy   . Anxiety   . Arthritis   . Asthma   . Cancer ([place])   . Cannabis abuse   . Cocaine abuse ([place])   . Depression   . Drug addiction ([place])   . GERD (gastroesophageal reflux disease)   . Homelessness   . Hypertension    pt stated "every once in a while BP will be high but has not been prescribed medication for HTN.   . Seasonal allergies   . 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 CESAREAN SECTION  1989  . FRACTURE SURGERY Left 2011    Home Medications:  (Not in a hospital admission)  Allergies:  Allergies  Allergen Reactions  . Lisinopril Other (See Comments)    hyperkalemia    Family History  Problem Relation Age of Onset  . Diabetes Father   . Colon cancer Neg Hx    Social History:  reports that she has been smoking cigarettes. She has been smoking about 1.00 pack per day for the past 0.00 years. She has never used smokeless tobacco. She reports current alcohol use of about 7.0 standard drinks of alcohol per week. She reports current drug use. Drugs: Cocaine and Marijuana.  ROS: A complete review of systems was performed.  All systems are negative except for pertinent findings as  noted. ROS   Physical Exam:  Vital signs in last 24 hours: Temp:  [100.5 F (38.1 C)] 100.5 F (38.1 C) (05/15 1242) Pulse Rate:  [88-119] 92 (05/15 1700) Resp:  [15-23] 15 (05/15 1700) BP: (125-158)/(64-81) 158/79 (05/15 1700) SpO2:  [93 %-100 %] 98 % (05/15 1700) Weight:  [64.4 kg] 64.4 kg (05/15 1242) General:  Alert and oriented, No acute distress appears uncomfortable HEENT: Normocephalic, atraumatic Neck: No JVD or lymphadenopathy Cardiovascular: Slightly tachycardic Lungs: Regular rate and effort Abdomen: Soft, nontender, nondistended, no abdominal masses, Back: Left CVA tenderness Extremities: No edema Neurologic: Grossly intact  Laboratory Data:  Results for orders placed or performed during the hospital encounter of 07/31/18 (from the past 24 hour(s))  Lipase, blood     Status: None   Collection Time: 07/31/18 12:56 PM  Result Value Ref Range   Lipase 38 11 - 51 U/L  Comprehensive metabolic panel     Status: Abnormal   Collection Time: 07/31/18 12:56 PM  Result Value Ref Range   Sodium 131 (L) 135 - 145 mmol/L   Potassium 4.3 3.5 - 5.1 mmol/L   Chloride 102 98 - 111 mmol/L   CO2 15 (L) 22 - 32 mmol/L   Glucose, Bld 83 70 - 99 mg/dL   BUN 30 (H) 6 - 20 mg/dL   Creatinine, Ser 2.29 (H) 0.44 - 1.00 mg/dL   Calcium 8.5 (L) 8.9 -  10.3 mg/dL   Total Protein 7.8 6.5 - 8.1 g/dL   Albumin 3.0 (L) 3.5 - 5.0 g/dL   AST 31 15 - 41 U/L   ALT 19 0 - 44 U/L   Alkaline Phosphatase 91 38 - 126 U/L   Total Bilirubin 0.3 0.3 - 1.2 mg/dL   GFR calc non Af Amer 23 (L) >60 mL/min   GFR calc Af Amer 27 (L) >60 mL/min   Anion gap 14 5 - 15  CBC     Status: Abnormal   Collection Time: 07/31/18 12:56 PM  Result Value Ref Range   WBC 14.8 (H) 4.0 - 10.5 K/uL   RBC 2.80 (L) 3.87 - 5.11 MIL/uL   Hemoglobin 8.1 (L) 12.0 - 15.0 g/dL   HCT 25.2 (L) 36.0 - 46.0 %   MCV 90.0 80.0 - 100.0 fL   MCH 28.9 26.0 - 34.0 pg   MCHC 32.1 30.0 - 36.0 g/dL   RDW 16.7 (H) 11.5 - 15.5 %    Platelets 375 150 - 400 K/uL   nRBC 0.0 0.0 - 0.2 %  Troponin I - ONCE - STAT     Status: None   Collection Time: 07/31/18  1:01 PM  Result Value Ref Range   Troponin I <0.03 <0.03 ng/mL  Lactic acid, plasma     Status: None   Collection Time: 07/31/18  1:01 PM  Result Value Ref Range   Lactic Acid, Venous 1.9 0.5 - 1.9 mmol/L  Protime-INR     Status: None   Collection Time: 07/31/18  1:01 PM  Result Value Ref Range   Prothrombin Time 13.0 11.4 - 15.2 seconds   INR 1.0 0.8 - 1.2  Salicylate level     Status: None   Collection Time: 07/31/18  1:01 PM  Result Value Ref Range   Salicylate Lvl <5.7 2.8 - 30.0 mg/dL  Urinalysis, Routine w reflex microscopic     Status: Abnormal   Collection Time: 07/31/18  5:18 PM  Result Value Ref Range   Color, Urine YELLOW YELLOW   APPearance HAZY (A) CLEAR   Specific Gravity, Urine 1.004 (L) 1.005 - 1.030   pH 7.0 5.0 - 8.0   Glucose, UA NEGATIVE NEGATIVE mg/dL   Hgb urine dipstick NEGATIVE NEGATIVE   Bilirubin Urine NEGATIVE NEGATIVE   Ketones, ur NEGATIVE NEGATIVE mg/dL   Protein, ur 30 (A) NEGATIVE mg/dL   Nitrite POSITIVE (A) NEGATIVE   Leukocytes,Ua LARGE (A) NEGATIVE   RBC / HPF 0-5 0 - 5 RBC/hpf   WBC, UA 6-10 0 - 5 WBC/hpf   Bacteria, UA FEW (A) NONE SEEN   Squamous Epithelial / LPF 0-5 0 - 5   No results found for this or any previous visit (from the past 240 hour(s)). Creatinine: Recent Labs    07/31/18 1256  CREATININE 2.29*   CT scan personally reviewed and is detailed in history of present illness.  Impression/Assessment:  Left ureteral calculus Ureteral obstruction secondary to calculus Renal colic ACute renal insufficiency Possible urinary tract infection  Plan:  Please consult the hospitalist for admission for the above.  We will perform a urgent ureteral stent placement on the left.  Risk and benefits discussed including but not limited to bleeding, infection, injury to surrounding structures, need for additional  procedures.  Addendum: After reviewing drug screen, she is cocaine positive.  There is concern about anesthesia with active drug use.  It is felt to be safer if we wait 12 hours and  perform the surgery in the morning.  She should be made n.p.o. at midnight and we will plan for stent in the morning which should provide adequate time for the cocaine to clear.  If there is significant clinical decline, such as hypotension or appearance of sepsis, please contact me and we will just need to stent her sooner.  Marton Redwood, III 07/31/2018, 6:00 PM

## 2018-07-31 NOTE — ED Notes (Signed)
Per EDP, hold second lactic acid drawl.
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
## 2018-07-31 NOTE — ED Notes (Signed)
Bed: HQ19 Expected date:  Expected time:  Means of arrival:  Comments: EMS-abdominal pain

## 2018-07-31 NOTE — ED Notes (Signed)
ED TO INPATIENT HANDOFF REPORT  Name/Age/Gender [name] 58 y.o. female  Code Status Code Status History    Date Active Date Inactive Code Status Order ID Comments User Context   06/07/2016 1336 06/08/2016 1459 DNR [id number]  Caren Griffins, MD Inpatient   05/04/2014 1935 05/05/2014 1604 Full Code [id number]  Mirna Mires, MD ED   10/28/2012 2136 10/30/2012 1643 Full Code [id number]  Phillips Grout, MD Inpatient    Questions for Most Recent Historical Code Status (Order [id number])    Question Answer Comment   In the event of cardiac or respiratory ARREST Do not call a "code blue"    In the event of cardiac or respiratory ARREST Do not perform Intubation, CPR, defibrillation or ACLS    In the event of cardiac or respiratory ARREST Use medication by any route, position, wound care, and other measures to relive pain and suffering. May use oxygen, suction and manual treatment of airway obstruction as needed for comfort.       Home/SNF/Other Home  Chief Complaint abdominal pain  Level of Care/Admitting Diagnosis ED Disposition    ED Disposition Condition [facility] Area: [place] [100102]  Level of Care: Med-Surg [16]  Covid Evaluation: Person Under Investigation (PUI)  Isolation Risk Level: Low Risk/Droplet (Less than 4L [place] supplementation)  Diagnosis: AKI (acute kidney injury) [facility]) [124580]  Admitting Physician: LAMA, Dwight  Attending Physician: Oswald Hillock [[id number]]  Estimated length of stay: 3 - 4 days  Certification:: I certify this patient will need inpatient services for at least 2 midnights  PT Class (Do Not Modify): Inpatient [101]  PT Acc Code (Do Not Modify): Private [1]       Medical History Past Medical History:  Diagnosis Date  . Alcohol abuse   . Allergy   . Anxiety   . Arthritis   . Asthma   . Cancer ([place])   . Cannabis abuse   . Cocaine abuse ([place])   . Depression   . Drug addiction ([place])   . GERD  (gastroesophageal reflux disease)   . Homelessness   . Hypertension    pt stated "every once in a while BP will be high but has not been prescribed medication for HTN.   . Seasonal allergies   . 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 Lisinopril Other (See Comments)    hyperkalemia    IV Location/Drains/Wounds Patient Lines/Drains/Airways Status   Active Line/Drains/Airways    Name:   Placement date:   Placement time:   Site:   Days:   Peripheral IV 07/31/18 Right Antecubital   07/31/18    1300    Antecubital   less than 1   Peripheral IV 07/31/18 Left Antecubital   07/31/18    1315    Antecubital   less than 1   Wound 10/28/12 Dehisced Leg Left;Lower;Lateral necrotic   10/28/12    2224    Leg   2102   Wound 02/07/13 Avulsion Leg Left   02/07/13    1645    Leg   2000          Labs/Imaging Results for orders placed or performed during the hospital encounter of 07/31/18 (from the past 48 hour(s))  Lipase, blood     Status: None   Collection Time: 07/31/18 12:56 PM  Result Value Ref Range  Lipase 38 11 - 51 U/L    Comment: Performed at [facility], [place] [address] [place], [place] [address]  Comprehensive metabolic panel     Status: Abnormal   Collection Time: 07/31/18 12:56 PM  Result Value Ref Range   Sodium 131 (L) 135 - 145 mmol/L   Potassium 4.3 3.5 - 5.1 mmol/L   Chloride 102 98 - 111 mmol/L   CO2 15 (L) 22 - 32 mmol/L   Glucose, Bld 83 70 - 99 mg/dL   BUN 30 (H) 6 - 20 mg/dL   Creatinine, Ser 2.29 (H) 0.44 - 1.00 mg/dL   Calcium 8.5 (L) 8.9 - 10.3 mg/dL   Total Protein 7.8 6.5 - 8.1 g/dL   Albumin 3.0 (L) 3.5 - 5.0 g/dL   AST 31 15 - 41 U/L   ALT 19 0 - 44 U/L   Alkaline Phosphatase 91 38 - 126 U/L   Total Bilirubin 0.3 0.3 - 1.2 mg/dL   GFR calc non Af Amer 23 (L) >60 mL/min   GFR calc Af Amer 27 (L) >60 mL/min   Anion gap 14 5 - 15     Comment: Performed at [facility], [place] [address] [place], [place] [address]  CBC     Status: Abnormal   Collection Time: 07/31/18 12:56 PM  Result Value Ref Range   WBC 14.8 (H) 4.0 - 10.5 K/uL   RBC 2.80 (L) 3.87 - 5.11 MIL/uL   Hemoglobin 8.1 (L) 12.0 - 15.0 g/dL   HCT 25.2 (L) 36.0 - 46.0 %   MCV 90.0 80.0 - 100.0 fL   MCH 28.9 26.0 - 34.0 pg   MCHC 32.1 30.0 - 36.0 g/dL   RDW 16.7 (H) 11.5 - 15.5 %   Platelets 375 150 - 400 K/uL   nRBC 0.0 0.0 - 0.2 %    Comment: Performed at [facility], [place] [address] [place], [place] [address]  Troponin I - ONCE - STAT     Status: None   Collection Time: 07/31/18  1:01 PM  Result Value Ref Range   Troponin I <0.03 <0.03 ng/mL    Comment: Performed at [facility], [place] [address], [place], [place] [address]  Lactic acid, plasma     Status: None   Collection Time: 07/31/18  1:01 PM  Result Value Ref Range   Lactic Acid, Venous 1.9 0.5 - 1.9 mmol/L    Comment: Performed at [facility], [place] [address] [place], [place] [address]  Protime-INR     Status: None   Collection Time: 07/31/18  1:01 PM  Result Value Ref Range   Prothrombin Time 13.0 11.4 - 15.2 seconds   INR 1.0 0.8 - 1.2    Comment: (NOTE) INR goal varies based on device and disease states. Performed at [facility], [place] [address] [place], [place] [address]   Salicylate level     Status: None   Collection Time: 07/31/18  1:01 PM  Result Value Ref Range   Salicylate Lvl <8.9 2.8 - 30.0 mg/dL    Comment: Performed at [facility], [place] [address] [place], [place] [address]  Urinalysis, Routine w reflex microscopic     Status: Abnormal   Collection Time: 07/31/18  5:18 PM  Result Value Ref Range   Color, Urine YELLOW YELLOW   APPearance HAZY (A) CLEAR   Specific Gravity, Urine 1.004 (L) 1.005 - 1.030   pH  7.0 5.0 - 8.0   Glucose, UA NEGATIVE NEGATIVE mg/dL    Hgb urine dipstick NEGATIVE NEGATIVE   Bilirubin Urine NEGATIVE NEGATIVE   Ketones, ur NEGATIVE NEGATIVE mg/dL   Protein, ur 30 (A) NEGATIVE mg/dL   Nitrite POSITIVE (A) NEGATIVE   Leukocytes,Ua LARGE (A) NEGATIVE   RBC / HPF 0-5 0 - 5 RBC/hpf   WBC, UA 6-10 0 - 5 WBC/hpf   Bacteria, UA FEW (A) NONE SEEN   Squamous Epithelial / LPF 0-5 0 - 5    Comment: Performed at [facility], [place] [address], [place], [place] [address]  Urine rapid drug screen (hosp performed)     Status: Abnormal   Collection Time: 07/31/18  5:18 PM  Result Value Ref Range   Opiates POSITIVE (A) NONE DETECTED   Cocaine POSITIVE (A) NONE DETECTED   Benzodiazepines NONE DETECTED NONE DETECTED   Amphetamines NONE DETECTED NONE DETECTED   Tetrahydrocannabinol NONE DETECTED NONE DETECTED   Barbiturates NONE DETECTED NONE DETECTED    Comment: (NOTE) DRUG SCREEN FOR MEDICAL PURPOSES ONLY.  IF CONFIRMATION IS NEEDED FOR ANY PURPOSE, NOTIFY LAB WITHIN 5 DAYS. LOWEST DETECTABLE LIMITS FOR URINE DRUG SCREEN Drug Class                     Cutoff (ng/mL) Amphetamine and metabolites    1000 Barbiturate and metabolites    200 Benzodiazepine                 341 Tricyclics and metabolites     300 Opiates and metabolites        300 Cocaine and metabolites        300 THC                            50 Performed at [facility], [place] [address], [place], [place] [address]    Ct Abdomen Pelvis Wo Contrast  Result Date: 07/31/2018 CLINICAL DATA:  Left side abdominal pain for 3 weeks. EXAM: CT ABDOMEN AND PELVIS WITHOUT CONTRAST TECHNIQUE: Multidetector CT imaging of the abdomen and pelvis was performed following the standard protocol without IV contrast. COMPARISON:  None. FINDINGS: Lower chest: Dependent atelectasis is identified. There is no pleural or pericardial effusion. Hepatobiliary: Mild fatty infiltration of the liver without focal lesion. Gallbladder and biliary tree are  unremarkable. Pancreas: Unremarkable. No pancreatic ductal dilatation or surrounding inflammatory changes. Spleen: Normal in size without focal abnormality. Adrenals/Urinary Tract: The patient has moderate left hydronephrosis due to a 0.6 cm stone just below the renal pelvis. 2 nonobstructing stones are seen in the left kidney measuring 0.5 cm in the lower pole and 1.1 cm in the upper pole. There is extensive stranding about the left kidney and proximal ureter. The right kidney is ptotic with an abnormal axis of orientation. The pelvis is directed anteriorly. Duplicated right renal collecting system is noted. Two nonobstructing stones are seen in the right kidney. The larger measures 0.5 cm. Urinary bladder is unremarkable. The adrenal glands appear normal. Stomach/Bowel: Stomach is within normal limits. Appendix appears normal. No evidence of bowel wall thickening, distention, or inflammatory changes. Vascular/Lymphatic: Aortic atherosclerosis. No enlarged abdominal or pelvic lymph nodes. Reproductive: Uterus and bilateral adnexa are unremarkable. Other: The none. Musculoskeletal: No acute or focal abnormality. IMPRESSION: Moderate left hydronephrosis due to a 0.6 cm stone just below the renal pelvis. The patient has 2 additional nonobstructing stone in the left  kidney. 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 Two nonobstructing stones are present in the right kidney. Fatty infiltration liver. Atherosclerosis. Electronically Signed   By: Inge Rise M.D.   On: 07/31/2018 16:21   Dg Chest Port 1 View  Result Date: 07/31/2018 CLINICAL DATA:  Chest discomfort EXAM: PORTABLE CHEST 1 VIEW COMPARISON:  10/29/2017 FINDINGS: The heart size and mediastinal contours are within normal limits. Both lungs are clear. The visualized skeletal structures are unremarkable. IMPRESSION: No active disease. Electronically Signed   By: Inez Catalina M.D.    On: 07/31/2018 14:29    Pending Labs Unresulted Labs (From admission, onward)    Start     Ordered   07/31/18 1842  Culture, Urine  Add-on,   R     07/31/18 1841   07/31/18 1629  SARS Coronavirus 2 (CEPHEID - Performed in [place] hospital lab), Hosp Order  (Asymptomatic Patients Labs)  Once,   R    Question:  Rule Out  Answer:  Yes   07/31/18 1628   07/31/18 1301  Blood culture (routine x 2)  BLOOD CULTURE X 2,   STAT     07/31/18 1300   Signed and Held  HIV antibody (Routine Testing)  Once,   R     Signed and Held   Signed and Held  CBC  (enoxaparin (LOVENOX)    CrCl >/= 30 ml/min)  Once,   R    Comments:  Baseline for enoxaparin therapy IF NOT ALREADY DRAWN.  Notify MD if PLT < 100 K.    Signed and Held   Signed and Held  Creatinine, serum  (enoxaparin (LOVENOX)    CrCl >/= 30 ml/min)  Once,   R    Comments:  Baseline for enoxaparin therapy IF NOT ALREADY DRAWN.    Signed and Held   Signed and Held  Creatinine, serum  (enoxaparin (LOVENOX)    CrCl >/= 30 ml/min)  Weekly,   R    Comments:  while on enoxaparin therapy    Signed and Held   Signed and Held  CBC  Tomorrow morning,   R     Signed and Held   Signed and Held  Comprehensive metabolic panel  Tomorrow morning,   R     Signed and Held          Vitals/Pain Today's Vitals   07/31/18 1630 07/31/18 1645 07/31/18 1700 07/31/18 1730  BP: (!) 148/76  (!) 158/79   Pulse: 88 94 92   Resp: 19 20 15    Temp:      TempSrc:      SpO2: 100% 98% 98%   Weight:      Height:      PainSc:    10-Worst pain ever    Isolation Precautions No active isolations  Medications Medications  cefTRIAXone (ROCEPHIN) 1 g in sodium chloride 0.9 % 100 mL IVPB (has no administration in time range)  sodium chloride flush (NS) 0.9 % injection 3 mL (3 mLs Intravenous Given 07/31/18 1307)  sodium chloride 0.9 % bolus 1,000 mL (0 mLs Intravenous Stopped 07/31/18 1507)  morphine 4 MG/ML injection 4 mg (4 mg Intravenous Given 07/31/18 1318)   ondansetron (ZOFRAN) injection 4 mg (4 mg Intravenous Given 07/31/18 1317)  0.9 %  sodium chloride infusion ( Intravenous New Bag/Given 07/31/18 1736)  morphine 4 MG/ML injection 4 mg (4 mg Intravenous Given 07/31/18 1733)  Mobility walks with person assist

## 2018-07-31 NOTE — ED Provider Notes (Signed)
Patient seen after signout from prior ED provider.  Patient's presentation is consistent with likely left-sided renal colic.  CT imaging reveals left-sided ureteral stone with hydro.  Patient does have evidence of acute kidney injury.  Patient's case discussed with urology Dr. Gloriann Loan.  He will evaluate the patient for possible intervention.  Hospital services were case and will evaluate for admission.    Valarie Merino, MD 07/31/18 1820

## 2018-07-31 NOTE — H&P (View-Only) (Signed)
H&P Physician requesting consult: Deno Etienne, DO  Chief Complaint: Left ureteral calculus  History of Present Illness: 58 year old female with a 3-week history of left-sided flank pain.  It is severe today.  This prompted a CT scan of the abdomen and pelvis which revealed a left-sided proximal ureteral calculus with upstream hydronephrosis.  There was some perinephric stranding.  Urinalysis revealed large leukocyte, positive nitrite, few bacteria, 6-10 WBCs.  She is noted to have acute renal insufficiency with a creatinine of 2.29.  Mild leukocytosis of 14.8.  She continues to have severe left-sided pain.  Past Medical History:  Diagnosis Date  . Alcohol abuse   . Allergy   . Anxiety   . Arthritis   . Asthma   . Cancer ([place])   . Cannabis abuse   . Cocaine abuse ([place])   . Depression   . Drug addiction ([place])   . GERD (gastroesophageal reflux disease)   . Homelessness   . Hypertension    pt stated "every once in a while BP will be high but has not been prescribed medication for HTN.   . Seasonal allergies   . 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 CESAREAN SECTION  1989  . FRACTURE SURGERY Left 2011    Home Medications:  (Not in a hospital admission)  Allergies:  Allergies  Allergen Reactions  . Lisinopril Other (See Comments)    hyperkalemia    Family History  Problem Relation Age of Onset  . Diabetes Father   . Colon cancer Neg Hx    Social History:  reports that she has been smoking cigarettes. She has been smoking about 1.00 pack per day for the past 0.00 years. She has never used smokeless tobacco. She reports current alcohol use of about 7.0 standard drinks of alcohol per week. She reports current drug use. Drugs: Cocaine and Marijuana.  ROS: A complete review of systems was performed.  All systems are negative except for pertinent findings as  noted. ROS   Physical Exam:  Vital signs in last 24 hours: Temp:  [100.5 F (38.1 C)] 100.5 F (38.1 C) (05/15 1242) Pulse Rate:  [88-119] 92 (05/15 1700) Resp:  [15-23] 15 (05/15 1700) BP: (125-158)/(64-81) 158/79 (05/15 1700) SpO2:  [93 %-100 %] 98 % (05/15 1700) Weight:  [64.4 kg] 64.4 kg (05/15 1242) General:  Alert and oriented, No acute distress appears uncomfortable HEENT: Normocephalic, atraumatic Neck: No JVD or lymphadenopathy Cardiovascular: Slightly tachycardic Lungs: Regular rate and effort Abdomen: Soft, nontender, nondistended, no abdominal masses, Back: Left CVA tenderness Extremities: No edema Neurologic: Grossly intact  Laboratory Data:  Results for orders placed or performed during the hospital encounter of 07/31/18 (from the past 24 hour(s))  Lipase, blood     Status: None   Collection Time: 07/31/18 12:56 PM  Result Value Ref Range   Lipase 38 11 - 51 U/L  Comprehensive metabolic panel     Status: Abnormal   Collection Time: 07/31/18 12:56 PM  Result Value Ref Range   Sodium 131 (L) 135 - 145 mmol/L   Potassium 4.3 3.5 - 5.1 mmol/L   Chloride 102 98 - 111 mmol/L   CO2 15 (L) 22 - 32 mmol/L   Glucose, Bld 83 70 - 99 mg/dL   BUN 30 (H) 6 - 20 mg/dL   Creatinine, Ser 2.29 (H) 0.44 - 1.00 mg/dL   Calcium 8.5 (L) 8.9 -  10.3 mg/dL   Total Protein 7.8 6.5 - 8.1 g/dL   Albumin 3.0 (L) 3.5 - 5.0 g/dL   AST 31 15 - 41 U/L   ALT 19 0 - 44 U/L   Alkaline Phosphatase 91 38 - 126 U/L   Total Bilirubin 0.3 0.3 - 1.2 mg/dL   GFR calc non Af Amer 23 (L) >60 mL/min   GFR calc Af Amer 27 (L) >60 mL/min   Anion gap 14 5 - 15  CBC     Status: Abnormal   Collection Time: 07/31/18 12:56 PM  Result Value Ref Range   WBC 14.8 (H) 4.0 - 10.5 K/uL   RBC 2.80 (L) 3.87 - 5.11 MIL/uL   Hemoglobin 8.1 (L) 12.0 - 15.0 g/dL   HCT 25.2 (L) 36.0 - 46.0 %   MCV 90.0 80.0 - 100.0 fL   MCH 28.9 26.0 - 34.0 pg   MCHC 32.1 30.0 - 36.0 g/dL   RDW 16.7 (H) 11.5 - 15.5 %    Platelets 375 150 - 400 K/uL   nRBC 0.0 0.0 - 0.2 %  Troponin I - ONCE - STAT     Status: None   Collection Time: 07/31/18  1:01 PM  Result Value Ref Range   Troponin I <0.03 <0.03 ng/mL  Lactic acid, plasma     Status: None   Collection Time: 07/31/18  1:01 PM  Result Value Ref Range   Lactic Acid, Venous 1.9 0.5 - 1.9 mmol/L  Protime-INR     Status: None   Collection Time: 07/31/18  1:01 PM  Result Value Ref Range   Prothrombin Time 13.0 11.4 - 15.2 seconds   INR 1.0 0.8 - 1.2  Salicylate level     Status: None   Collection Time: 07/31/18  1:01 PM  Result Value Ref Range   Salicylate Lvl <3.9 2.8 - 30.0 mg/dL  Urinalysis, Routine w reflex microscopic     Status: Abnormal   Collection Time: 07/31/18  5:18 PM  Result Value Ref Range   Color, Urine YELLOW YELLOW   APPearance HAZY (A) CLEAR   Specific Gravity, Urine 1.004 (L) 1.005 - 1.030   pH 7.0 5.0 - 8.0   Glucose, UA NEGATIVE NEGATIVE mg/dL   Hgb urine dipstick NEGATIVE NEGATIVE   Bilirubin Urine NEGATIVE NEGATIVE   Ketones, ur NEGATIVE NEGATIVE mg/dL   Protein, ur 30 (A) NEGATIVE mg/dL   Nitrite POSITIVE (A) NEGATIVE   Leukocytes,Ua LARGE (A) NEGATIVE   RBC / HPF 0-5 0 - 5 RBC/hpf   WBC, UA 6-10 0 - 5 WBC/hpf   Bacteria, UA FEW (A) NONE SEEN   Squamous Epithelial / LPF 0-5 0 - 5   No results found for this or any previous visit (from the past 240 hour(s)). Creatinine: Recent Labs    07/31/18 1256  CREATININE 2.29*   CT scan personally reviewed and is detailed in history of present illness.  Impression/Assessment:  Left ureteral calculus Ureteral obstruction secondary to calculus Renal colic ACute renal insufficiency Possible urinary tract infection  Plan:  Please consult the hospitalist for admission for the above.  We will perform a urgent ureteral stent placement on the left.  Risk and benefits discussed including but not limited to bleeding, infection, injury to surrounding structures, need for additional  procedures.  Addendum: After reviewing drug screen, she is cocaine positive.  There is concern about anesthesia with active drug use.  It is felt to be safer if we wait 12 hours and  perform the surgery in the morning.  She should be made n.p.o. at midnight and we will plan for stent in the morning which should provide adequate time for the cocaine to clear.  If there is significant clinical decline, such as hypotension or appearance of sepsis, please contact me and we will just need to stent her sooner.  Marton Redwood, III 07/31/2018, 6:00 PM

## 2018-07-31 NOTE — ED Notes (Signed)
Spoke to OR. Patient will be going to surgery tomorrow. Patient will be NPO past midnight.

## 2018-08-01 ENCOUNTER — Inpatient Hospital Stay (HOSPITAL_COMMUNITY): Payer: Self-pay

## 2018-08-01 ENCOUNTER — Encounter (HOSPITAL_COMMUNITY)
Admission: EM | Disposition: A | Discharge status: Home / Self Care | Payer: Self-pay | Source: Home / Self Care | Attending: Family Medicine

## 2018-08-01 ENCOUNTER — Inpatient Hospital Stay (HOSPITAL_COMMUNITY): Payer: Self-pay | Admitting: Certified Registered Nurse Anesthetist

## 2018-08-01 DIAGNOSIS — R7881 Bacteremia: Secondary | ICD-10-CM

## 2018-08-01 HISTORY — PX: CYSTOSCOPY W/ URETERAL STENT PLACEMENT: SHX1429

## 2018-08-01 LAB — CBC
HCT: 23.7 % — ABNORMAL LOW (ref 36.0–46.0)
Hemoglobin: 7.1 g/dL — ABNORMAL LOW (ref 12.0–15.0)
MCH: 28.2 pg (ref 26.0–34.0)
MCHC: 30 g/dL (ref 30.0–36.0)
MCV: 94 fL (ref 80.0–100.0)
Platelets: 261 10*3/uL (ref 150–400)
RBC: 2.52 MIL/uL — ABNORMAL LOW (ref 3.87–5.11)
RDW: 17.1 % — ABNORMAL HIGH (ref 11.5–15.5)
WBC: 21.3 10*3/uL — ABNORMAL HIGH (ref 4.0–10.5)
nRBC: 0 % (ref 0.0–0.2)

## 2018-08-01 LAB — COMPREHENSIVE METABOLIC PANEL
ALT: 15 U/L (ref 0–44)
AST: 25 U/L (ref 15–41)
Albumin: 2.5 g/dL — ABNORMAL LOW (ref 3.5–5.0)
Alkaline Phosphatase: 82 U/L (ref 38–126)
Anion gap: 11 (ref 5–15)
BUN: 30 mg/dL — ABNORMAL HIGH (ref 6–20)
CO2: 15 mmol/L — ABNORMAL LOW (ref 22–32)
Calcium: 8.2 mg/dL — ABNORMAL LOW (ref 8.9–10.3)
Chloride: 108 mmol/L (ref 98–111)
Creatinine, Ser: 2.69 mg/dL — ABNORMAL HIGH (ref 0.44–1.00)
GFR calc Af Amer: 22 mL/min — ABNORMAL LOW (ref >60)
GFR calc non Af Amer: 19 mL/min — ABNORMAL LOW (ref >60)
Glucose, Bld: 103 mg/dL — ABNORMAL HIGH (ref 70–99)
Potassium: 4.5 mmol/L (ref 3.5–5.1)
Sodium: 134 mmol/L — ABNORMAL LOW (ref 135–145)
Total Bilirubin: 0.3 mg/dL (ref 0.3–1.2)
Total Protein: 6.6 g/dL (ref 6.5–8.1)

## 2018-08-01 LAB — BLOOD CULTURE ID PANEL (REFLEXED)
Acinetobacter baumannii: NOT DETECTED
Candida albicans: NOT DETECTED
Candida glabrata: NOT DETECTED
Candida krusei: NOT DETECTED
Candida parapsilosis: NOT DETECTED
Candida tropicalis: NOT DETECTED
Carbapenem resistance: NOT DETECTED
Enterobacter cloacae complex: NOT DETECTED
Enterobacteriaceae species: DETECTED — AB
Enterococcus species: NOT DETECTED
Escherichia coli: DETECTED — AB
Haemophilus influenzae: NOT DETECTED
Klebsiella oxytoca: NOT DETECTED
Klebsiella pneumoniae: DETECTED — AB
Listeria monocytogenes: NOT DETECTED
Neisseria meningitidis: NOT DETECTED
Proteus species: NOT DETECTED
Pseudomonas aeruginosa: NOT DETECTED
Serratia marcescens: NOT DETECTED
Staphylococcus aureus (BCID): NOT DETECTED
Staphylococcus species: NOT DETECTED
Streptococcus agalactiae: NOT DETECTED
Streptococcus pneumoniae: NOT DETECTED
Streptococcus pyogenes: NOT DETECTED
Streptococcus species: NOT DETECTED

## 2018-08-01 LAB — GLUCOSE, CAPILLARY
Glucose-Capillary: 107 mg/dL — ABNORMAL HIGH (ref 70–99)
Glucose-Capillary: 98 mg/dL (ref 70–99)
Glucose-Capillary: 158 mg/dL — ABNORMAL HIGH (ref 70–99)

## 2018-08-01 LAB — HIV ANTIBODY (ROUTINE TESTING W REFLEX): HIV Screen 4th Generation wRfx: NONREACTIVE

## 2018-08-01 SURGERY — CYSTOSCOPY, WITH RETROGRADE PYELOGRAM AND URETERAL STENT INSERTION
Anesthesia: General | Site: Ureter | Laterality: Left

## 2018-08-01 MED ORDER — SODIUM CHLORIDE 0.9 % IV SOLN
INTRAVENOUS | Status: AC
  Filled 2018-08-01: qty 10

## 2018-08-01 MED ORDER — LIDOCAINE 2% (20 MG/ML) 5 ML SYRINGE
INTRAMUSCULAR | Status: DC | PRN
  Administered 2018-08-01: 60 mg via INTRAVENOUS

## 2018-08-01 MED ORDER — LIDOCAINE 2% (20 MG/ML) 5 ML SYRINGE
INTRAMUSCULAR | Status: AC
  Filled 2018-08-01: qty 5

## 2018-08-01 MED ORDER — SODIUM CHLORIDE 0.9 % IV SOLN
INTRAVENOUS | Status: DC | PRN
  Administered 2018-08-01: 11:00:00 via INTRAVENOUS

## 2018-08-01 MED ORDER — PHENYLEPHRINE 40 MCG/ML (10ML) SYRINGE FOR IV PUSH (FOR BLOOD PRESSURE SUPPORT)
PREFILLED_SYRINGE | INTRAVENOUS | Status: DC | PRN
  Administered 2018-08-01: 80 ug via INTRAVENOUS
  Administered 2018-08-01: 100 ug via INTRAVENOUS
  Administered 2018-08-01: 80 ug via INTRAVENOUS

## 2018-08-01 MED ORDER — PHENYLEPHRINE 40 MCG/ML (10ML) SYRINGE FOR IV PUSH (FOR BLOOD PRESSURE SUPPORT)
PREFILLED_SYRINGE | INTRAVENOUS | Status: AC
  Filled 2018-08-01: qty 10

## 2018-08-01 MED ORDER — SODIUM CHLORIDE 0.9 % IV SOLN: INTRAVENOUS | Status: DC

## 2018-08-01 MED ORDER — FENTANYL CITRATE (PF) 100 MCG/2ML IJ SOLN
INTRAMUSCULAR | Status: DC | PRN
  Administered 2018-08-01: 25 ug via INTRAVENOUS

## 2018-08-01 MED ORDER — ACETAMINOPHEN 500 MG PO TABS
1000.0000 mg | ORAL_TABLET | Freq: Once | ORAL | Status: AC
  Administered 2018-08-01: 1000 mg via ORAL

## 2018-08-01 MED ORDER — DEXAMETHASONE SODIUM PHOSPHATE 10 MG/ML IJ SOLN
INTRAMUSCULAR | Status: AC
  Filled 2018-08-01: qty 1

## 2018-08-01 MED ORDER — PROPOFOL 10 MG/ML IV BOLUS
INTRAVENOUS | Status: AC
  Filled 2018-08-01: qty 20

## 2018-08-01 MED ORDER — ONDANSETRON HCL 4 MG/2ML IJ SOLN
INTRAMUSCULAR | Status: DC | PRN
  Administered 2018-08-01: 4 mg via INTRAVENOUS

## 2018-08-01 MED ORDER — ALBUTEROL SULFATE (2.5 MG/3ML) 0.083% IN NEBU
2.5000 mg | INHALATION_SOLUTION | Freq: Four times a day (QID) | RESPIRATORY_TRACT | Status: DC | PRN
  Administered 2018-08-01: 10:00:00 2.5 mg via RESPIRATORY_TRACT

## 2018-08-01 MED ORDER — ACETAMINOPHEN 500 MG PO TABS
ORAL_TABLET | ORAL | Status: AC
  Filled 2018-08-01: qty 2

## 2018-08-01 MED ORDER — SODIUM CHLORIDE 0.9 % IV SOLN
2.0000 g | INTRAVENOUS | Status: DC
  Administered 2018-08-01: 11:00:00 1 g via INTRAVENOUS
  Administered 2018-08-02 – 2018-08-04 (×3): 2 g via INTRAVENOUS
  Filled 2018-08-01 (×3): qty 2

## 2018-08-01 MED ORDER — MIDAZOLAM HCL 2 MG/2ML IJ SOLN
INTRAMUSCULAR | Status: AC
  Filled 2018-08-01: qty 2

## 2018-08-01 MED ORDER — MIDAZOLAM HCL 5 MG/5ML IJ SOLN
INTRAMUSCULAR | Status: DC | PRN
  Administered 2018-08-01 (×2): 1 mg via INTRAVENOUS

## 2018-08-01 MED ORDER — ONDANSETRON HCL 4 MG/2ML IJ SOLN
INTRAMUSCULAR | Status: AC
  Filled 2018-08-01: qty 2

## 2018-08-01 MED ORDER — ALBUMIN HUMAN 5 % IV SOLN: 12.5000 g | Freq: Once | INTRAVENOUS | Status: DC

## 2018-08-01 MED ORDER — PROPOFOL 10 MG/ML IV BOLUS
INTRAVENOUS | Status: DC | PRN
  Administered 2018-08-01: 80 mg via INTRAVENOUS

## 2018-08-01 MED ORDER — ONDANSETRON HCL 4 MG/2ML IJ SOLN: 4.0000 mg | Freq: Once | INTRAMUSCULAR | Status: DC | PRN

## 2018-08-01 MED ORDER — EPHEDRINE SULFATE-NACL 50-0.9 MG/10ML-% IV SOSY
PREFILLED_SYRINGE | INTRAVENOUS | Status: DC | PRN
  Administered 2018-08-01: 5 mg via INTRAVENOUS

## 2018-08-01 MED ORDER — SODIUM CHLORIDE 0.9 % IR SOLN
Status: DC | PRN
  Administered 2018-08-01: 3000 mL

## 2018-08-01 MED ORDER — FENTANYL CITRATE (PF) 100 MCG/2ML IJ SOLN
INTRAMUSCULAR | Status: AC
  Filled 2018-08-01: qty 2

## 2018-08-01 MED ORDER — FENTANYL CITRATE (PF) 100 MCG/2ML IJ SOLN: 25.0000 ug | INTRAMUSCULAR | Status: DC | PRN

## 2018-08-01 MED ORDER — ALBUTEROL SULFATE (2.5 MG/3ML) 0.083% IN NEBU
INHALATION_SOLUTION | RESPIRATORY_TRACT | Status: AC
  Filled 2018-08-01: qty 3

## 2018-08-01 MED ORDER — EPHEDRINE 5 MG/ML INJ
INTRAVENOUS | Status: AC
  Filled 2018-08-01: qty 10

## 2018-08-01 SURGICAL SUPPLY — 14 items
BAG URO CATCHER STRL LF (MISCELLANEOUS) ×3 IMPLANT
CATH INTERMIT  6FR 70CM (CATHETERS) ×3 IMPLANT
CLOTH BEACON ORANGE TIMEOUT ST (SAFETY) ×3 IMPLANT
COVER WAND RF STERILE (DRAPES) IMPLANT
GLOVE BIO SURGEON STRL SZ7.5 (GLOVE) ×3 IMPLANT
GOWN STRL REUS W/TWL LRG LVL3 (GOWN DISPOSABLE) ×6 IMPLANT
GUIDEWIRE STR DUAL SENSOR (WIRE) ×3 IMPLANT
KIT TURNOVER KIT A (KITS) IMPLANT
MANIFOLD NEPTUNE II (INSTRUMENTS) ×3 IMPLANT
PACK CYSTO (CUSTOM PROCEDURE TRAY) ×3 IMPLANT
STENT URET 6FRX26 CONTOUR (STENTS) ×2 IMPLANT
TUBING CONNECTING 10 (TUBING) ×2 IMPLANT
TUBING CONNECTING 10' (TUBING) ×1
TUBING UROLOGY SET (TUBING) IMPLANT

## 2018-08-01 NOTE — Progress Notes (Signed)
PHARMACY - PHYSICIAN COMMUNICATION CRITICAL VALUE ALERT - BLOOD CULTURE IDENTIFICATION (BCID)  [name] is an 58 y.o. female who presented to [facility] on 07/31/2018 with a chief complaint of abd pain.  Abd CT on 5/15 showed left hydronephrosis secondary to kidney stones. Patient was started on ceftriaxone 1gm q24h on admission for UTI. Plan for cystoscopy on 5/16.  Name of physician (or Provider) Contacted: Dr. Darrick Meigs  Current antibiotics: ceftriaxone 1gm IV q24h  Changes to prescribed antibiotics recommended:  - Increase ceftriaxone dose to 2gm IV q24h  Results for orders placed or performed during the hospital encounter of 07/31/18  Blood Culture ID Panel (Reflexed) (Collected: 07/31/2018  1:01 PM)  Result Value Ref Range   Enterococcus species NOT DETECTED NOT DETECTED   Listeria monocytogenes NOT DETECTED NOT DETECTED   Staphylococcus species NOT DETECTED NOT DETECTED   Staphylococcus aureus (BCID) NOT DETECTED NOT DETECTED   Streptococcus species NOT DETECTED NOT DETECTED   Streptococcus agalactiae NOT DETECTED NOT DETECTED   Streptococcus pneumoniae NOT DETECTED NOT DETECTED   Streptococcus pyogenes NOT DETECTED NOT DETECTED   Acinetobacter baumannii NOT DETECTED NOT DETECTED   Enterobacteriaceae species DETECTED (A) NOT DETECTED   Enterobacter cloacae complex NOT DETECTED NOT DETECTED   Escherichia coli DETECTED (A) NOT DETECTED   Klebsiella oxytoca NOT DETECTED NOT DETECTED   Klebsiella pneumoniae DETECTED (A) NOT DETECTED   Proteus species NOT DETECTED NOT DETECTED   Serratia marcescens NOT DETECTED NOT DETECTED   Carbapenem resistance NOT DETECTED NOT DETECTED   Haemophilus influenzae NOT DETECTED NOT DETECTED   Neisseria meningitidis NOT DETECTED NOT DETECTED   Pseudomonas aeruginosa NOT DETECTED NOT DETECTED   Candida albicans NOT DETECTED NOT DETECTED   Candida glabrata NOT DETECTED NOT DETECTED   Candida krusei NOT DETECTED NOT DETECTED   Candida parapsilosis  NOT DETECTED NOT DETECTED   Candida tropicalis NOT DETECTED NOT DETECTED    [name] P 08/01/2018  10:05 AM

## 2018-08-01 NOTE — Discharge Instructions (Signed)
[facility] °[phone] °Post Ureteroscopy With or Without Stent Instructions ° °Definitions: ° °Ureter: The duct that transports urine from the kidney to the bladder. °Stent:   A plastic hollow tube that is placed into the ureter, from the kidney to the                 bladder to prevent the ureter from swelling shut. ° °GENERAL INSTRUCTIONS: ° °Despite the fact that no skin incisions were used, the area around the ureter and bladder is raw and irritated. The stent is a foreign body which will further irritate the bladder wall. This irritation is manifested by increased frequency of urination, both day and night, and by an increase in the urge to urinate. In some, the urge to urinate is present almost always. Sometimes the urge is strong enough that you may not be able to stop yourself from urinating. The only real cure is to remove the stent and then give time for the bladder wall to heal which can't be done until the danger of the ureter swelling shut has passed, which varies. ° °You may see some blood in your urine while the stent is in place and a few days afterwards. Do not be alarmed, even if the urine was clear for a while. Get off your feet and drink lots of fluids until clearing occurs. If you start to pass clots or don't improve, call us. ° °DIET: °You may return to your normal diet immediately. Because of the raw surface of your bladder, alcohol, spicy foods, acid type foods and drinks with caffeine may cause irritation or frequency and should be used in moderation. To keep your urine flowing freely and to avoid constipation, drink plenty of fluids during the day ( 8-10 glasses ). °Tip: Avoid cranberry juice because it is very acidic. ° °ACTIVITY: °Your physical activity doesn't need to be restricted. However, if you are very active, you may see some blood in your urine. We suggest that you reduce your activity under these circumstances until the bleeding has stopped. ° °BOWELS: °It is  important to keep your bowels regular during the postoperative period. Straining with bowel movements can cause bleeding. A bowel movement every other day is reasonable. Use a mild laxative if needed, such as Milk of Magnesia 2-3 tablespoons, or 2 Dulcolax tablets. Call if you continue to have problems. If you have been taking narcotics for pain, before, during or after your surgery, you may be constipated. Take a laxative if necessary. ° ° °MEDICATION: °You should resume your pre-surgery medications unless told not to. °You may take oxybutynin or flomax if prescribed for bladder spasms or discomfort from the stent °Take pain medication as directed for pain refractory to conservative management ° °PROBLEMS YOU SHOULD REPORT TO US: °· Fevers over 100.5 Fahrenheit. °· Heavy bleeding, or clots ( See above notes about blood in urine ). °· Inability to urinate. °· Drug reactions ( hives, rash, nausea, vomiting, diarrhea ). °· Severe burning or pain with urination that is not improving. ° °

## 2018-08-01 NOTE — Progress Notes (Signed)
[facility]  PROGRESS NOTE  [name] QMG:[id number] DOB: [birth year] DOA: 07/31/2018 PCP: Ladell Pier, MD   Brief HPI:   58 year old female with a history of alcohol abuse, asthma, depression, hypertension, CKD stage III came to hospital with left-sided flank pain for past 3 days.  CT of the abdomen pelvis showed left hydronephrosis with 0.6 cm stone just below renal pelvis.  Urology was consulted.    Subjective   Patient seen and examined, underwent double-J stent placement in left ureter today.   Assessment/Plan:     1. Gram-negative rod bacteremia-blood cultures 2/4 growing gram-negative rods.  Will change ceftriaxone to 2 g IV every 24 hours.  Await final culture exacerbated results.  2. Left ureteral stone-status post cystoscopy and ureteral stent placement as per urology.  Urology following.  Continue pain management.  3. UTI-patient is on IV ceftriaxone.  Final urine culture result is pending.  4. Hypertension-blood pressure is stable, continue Coreg.  5. Acute kidney injury on CKD stage III-secondary postobstructive nephropathy due to renal stone.  Baseline creatinine around 1.5.  Creatinine is worse today at 2.69.  Follow BMP in a.m.  6. History of asthma-stable, not in exacerbation.  SARS-CoV-2 test is negative.       CBG: Recent Labs  Lab 07/31/18 2010 07/31/18 2353 08/01/18 0418 08/01/18 0731 08/01/18 1625  GLUCAP 112* 97 107* 98 158*    CBC: Recent Labs  Lab 07/31/18 1256 08/01/18 0521  WBC 14.8* 21.3*  HGB 8.1* 7.1*  HCT 25.2* 23.7*  MCV 90.0 94.0  PLT 375 326    Basic Metabolic Panel: Recent Labs  Lab 07/31/18 1256 08/01/18 0521  NA 131* 134*  K 4.3 4.5  CL 102 108  CO2 15* 15*  GLUCOSE 83 103*  BUN 30* 30*  CREATININE 2.29* 2.69*  CALCIUM 8.5* 8.2*     DVT prophylaxis: Lovenox  Code Status: Full code  Family Communication: No family at bedside  Disposition Plan: likely home when medically ready for  discharge     Consultants:  Urology  Procedures:  Left ureteral stent placement   Antibiotics:   Anti-infectives (From admission, onward)   Start     Dose/Rate Route Frequency Ordered Stop   08/01/18 1200  cefTRIAXone (ROCEPHIN) 2 g in sodium chloride 0.9 % 100 mL IVPB     2 g 200 mL/hr over 30 Minutes Intravenous Every 24 hours 08/01/18 1012     08/01/18 0923  sodium chloride 0.9 % with cefTRIAXone (ROCEPHIN) ADS Med    Note to Pharmacy:  Guerry Bruin   : cabinet override      08/01/18 0923 08/01/18 2129   07/31/18 2100  cefTRIAXone (ROCEPHIN) 1 g in sodium chloride 0.9 % 100 mL IVPB  Status:  Discontinued     1 g 200 mL/hr over 30 Minutes Intravenous Every 24 hours 07/31/18 1958 08/01/18 1012   07/31/18 1830  cefTRIAXone (ROCEPHIN) 1 g in sodium chloride 0.9 % 100 mL IVPB  Status:  Discontinued     1 g 200 mL/hr over 30 Minutes Intravenous  Once 07/31/18 1820 07/31/18 2002       Objective   Vitals:   08/01/18 1130 08/01/18 1145 08/01/18 1200 08/01/18 1219  BP: 100/70  96/70 107/68  Pulse: 70 72 71 67  Resp: 16   14  Temp:   97.7 F (36.5 C) 97.6 F (36.4 C)  TempSrc:    Axillary  SpO2: 100% 99% 100% 99%  Weight:  Height:        Intake/Output Summary (Last 24 hours) at 08/01/2018 1650 Last data filed at 08/01/2018 1118 Gross per 24 hour  Intake 550 ml  Output 2 ml  Net 548 ml   Filed Weights   07/31/18 1242  Weight: 64.4 kg     Physical Examination:  General-appears in no acute distress Heart-S1-S2, regular, no murmur auscultated Lungs-clear to auscultation bilaterally, no wheezing or crackles auscultated Abdomen-soft, nontender, no organomegaly Extremities-no edema in the lower extremities Neuro-alert, oriented x3, no focal deficit noted    Data Reviewed: I have personally reviewed following labs and imaging studies   Recent Results (from the past 240 hour(s))  Blood culture (routine x 2)     Status: None (Preliminary result)    Collection Time: 07/31/18  1:01 PM  Result Value Ref Range Status   Specimen Description   Final    BLOOD RIGHT ANTECUBITAL Performed at [facility], [place] [address] [place], [place] [address]    Special Requests   Final    BOTTLES DRAWN AEROBIC AND ANAEROBIC Blood Culture results may not be optimal due to an excessive volume of blood received in culture bottles   Culture  Setup Time   Final    GRAM NEGATIVE RODS ANAEROBIC BOTTLE ONLY CRITICAL RESULT CALLED TO, READ BACK BY AND VERIFIED WITH: PHAM PHARMD AT 1000 ON [id number] BY SJW Performed at [facility], [place] [address], [place], [place] [address]    Culture GRAM NEGATIVE RODS  Final   Report Status PENDING  Incomplete  Blood Culture ID Panel (Reflexed)     Status: Abnormal   Collection Time: 07/31/18  1:01 PM  Result Value Ref Range Status   Enterococcus species NOT DETECTED NOT DETECTED Final   Listeria monocytogenes NOT DETECTED NOT DETECTED Final   Staphylococcus species NOT DETECTED NOT DETECTED Final   Staphylococcus aureus (BCID) NOT DETECTED NOT DETECTED Final   Streptococcus species NOT DETECTED NOT DETECTED Final   Streptococcus agalactiae NOT DETECTED NOT DETECTED Final   Streptococcus pneumoniae NOT DETECTED NOT DETECTED Final   Streptococcus pyogenes NOT DETECTED NOT DETECTED Final   Acinetobacter baumannii NOT DETECTED NOT DETECTED Final   Enterobacteriaceae species DETECTED (A) NOT DETECTED Final    Comment: CRITICAL RESULT CALLED TO, READ BACK BY AND VERIFIED WITH: PHAM PHARMD AT 1000 ON [id number] BY SJW    Enterobacter cloacae complex NOT DETECTED NOT DETECTED Final   Escherichia coli DETECTED (A) NOT DETECTED Final    Comment: CRITICAL RESULT CALLED TO, READ BACK BY AND VERIFIED WITH: PHAM PHARMD AT 1000 ON 051620 BY SJW    Klebsiella oxytoca NOT DETECTED NOT DETECTED Final   Klebsiella pneumoniae DETECTED (A) NOT DETECTED Final    Comment: CRITICAL RESULT CALLED TO, READ BACK BY AND  VERIFIED WITH: PHAM PHARMD AT 1000 ON [id number] BY SJW    Proteus species NOT DETECTED NOT DETECTED Final   Serratia marcescens NOT DETECTED NOT DETECTED Final   Carbapenem resistance NOT DETECTED NOT DETECTED Final   Haemophilus influenzae NOT DETECTED NOT DETECTED Final   Neisseria meningitidis NOT DETECTED NOT DETECTED Final   Pseudomonas aeruginosa NOT DETECTED NOT DETECTED Final   Candida albicans NOT DETECTED NOT DETECTED Final   Candida glabrata NOT DETECTED NOT DETECTED Final   Candida krusei NOT DETECTED NOT DETECTED Final   Candida parapsilosis NOT DETECTED NOT DETECTED Final   Candida tropicalis NOT DETECTED NOT DETECTED Final    Comment: Performed at [facility], [place] [address], [place], [place] [address]  Blood culture (routine x 2)     Status: None (Preliminary result)   Collection Time: 07/31/18  1:06 PM  Result Value Ref Range Status   Specimen Description   Final    BLOOD LEFT ANTECUBITAL Performed at [place] [address], [place], [place] [address]    Special Requests   Final    BOTTLES DRAWN AEROBIC AND ANAEROBIC Blood Culture adequate volume   Culture  Setup Time   Final    GRAM NEGATIVE RODS IN BOTH AEROBIC AND ANAEROBIC BOTTLES CRITICAL VALUE NOTED.  VALUE IS CONSISTENT WITH PREVIOUSLY REPORTED AND CALLED VALUE. Performed at [facility], [place] [address], [place], [place] [address]    Culture GRAM NEGATIVE RODS  Final   Report Status PENDING  Incomplete  SARS Coronavirus 2 (CEPHEID - Performed in [place] [facility] lab), Hosp Order     Status: None   Collection Time: 07/31/18  5:38 PM  Result Value Ref Range Status   SARS Coronavirus 2 NEGATIVE NEGATIVE Final    Comment: (NOTE) If result is NEGATIVE SARS-CoV-2 target nucleic acids are NOT DETECTED. The SARS-CoV-2 RNA is generally detectable in upper and lower  respiratory specimens during the acute phase of infection. The lowest  concentration of SARS-CoV-2 viral  copies this assay can detect is 250  copies / mL. A negative result does not preclude SARS-CoV-2 infection  and should not be used as the sole basis for treatment or other  patient management decisions.  A negative result may occur with  improper specimen collection / handling, submission of specimen other  than nasopharyngeal swab, presence of viral mutation(s) within the  areas targeted by this assay, and inadequate number of viral copies  (<250 copies / mL). A negative result must be combined with clinical  observations, patient history, and epidemiological information. If result is POSITIVE SARS-CoV-2 target nucleic acids are DETECTED. The SARS-CoV-2 RNA is generally detectable in upper and lower  respiratory specimens dur ing the acute phase of infection.  Positive  results are indicative of active infection with SARS-CoV-2.  Clinical  correlation with patient history and other diagnostic information is  necessary to determine patient infection status.  Positive results do  not rule out bacterial infection or co-infection with other viruses. If result is PRESUMPTIVE POSTIVE SARS-CoV-2 nucleic acids MAY BE PRESENT.   A presumptive positive result was obtained on the submitted specimen  and confirmed on repeat testing.  While 2019 novel coronavirus  (SARS-CoV-2) nucleic acids may be present in the submitted sample  additional confirmatory testing may be necessary for epidemiological  and / or clinical management purposes  to differentiate between  SARS-CoV-2 and other Sarbecovirus currently known to infect humans.  If clinically indicated additional testing with an alternate test  methodology [phone]) is advised. The SARS-CoV-2 RNA is generally  detectable in upper and lower respiratory sp ecimens during the acute  phase of infection. The expected result is Negative. Fact Sheet for Patients:  StrictlyIdeas.no Fact Sheet for Healthcare  Providers: BankingDealers.co.za This test is not yet approved or cleared by the [place] FDA and has been authorized for detection and/or diagnosis of SARS-CoV-2 by FDA under an Emergency Use Authorization (EUA).  This EUA will remain in effect (meaning this test can be used) for the duration of the COVID-19 declaration under Section 564(b)(1) of the Act, 21 U.S.C. section 360bbb-3(b)(1), unless the authorization is terminated or revoked  sooner. Performed at [facility], [place] [address], [place], [place] [address]      Liver Function Tests: Recent Labs  Lab 07/31/18 1256 08/01/18 0521  AST 31 25  ALT 19 15  ALKPHOS 91 82  BILITOT 0.3 0.3  PROT 7.8 6.6  ALBUMIN 3.0* 2.5*   Recent Labs  Lab 07/31/18 1256  LIPASE 38   No results for input(s): AMMONIA in the last 168 hours.  Cardiac Enzymes: Recent Labs  Lab 07/31/18 1301  TROPONINI <0.03   BNP (last 3 results) No results for input(s): BNP in the last 8760 hours.  ProBNP (last 3 results) No results for input(s): PROBNP in the last 8760 hours.    Studies: Ct Abdomen Pelvis Wo Contrast  Result Date: 07/31/2018 CLINICAL DATA:  Left side abdominal pain for 3 weeks. EXAM: CT ABDOMEN AND PELVIS WITHOUT CONTRAST TECHNIQUE: Multidetector CT imaging of the abdomen and pelvis was performed following the standard protocol without IV contrast. COMPARISON:  None. FINDINGS: Lower chest: Dependent atelectasis is identified. There is no pleural or pericardial effusion. Hepatobiliary: Mild fatty infiltration of the liver without focal lesion. Gallbladder and biliary tree are unremarkable. Pancreas: Unremarkable. No pancreatic ductal dilatation or surrounding inflammatory changes. Spleen: Normal in size without focal abnormality. Adrenals/Urinary Tract: The patient has moderate left hydronephrosis due to a 0.6 cm stone just below the renal pelvis. 2 nonobstructing stones are seen in the left  kidney measuring 0.5 cm in the lower pole and 1.1 cm in the upper pole. There is extensive stranding about the left kidney and proximal ureter. The right kidney is ptotic with an abnormal axis of orientation. The pelvis is directed anteriorly. Duplicated right renal collecting system is noted. Two nonobstructing stones are seen in the right kidney. The larger measures 0.5 cm. Urinary bladder is unremarkable. The adrenal glands appear normal. Stomach/Bowel: Stomach is within normal limits. Appendix appears normal. No evidence of bowel wall thickening, distention, or inflammatory changes. Vascular/Lymphatic: Aortic atherosclerosis. No enlarged abdominal or pelvic lymph nodes. Reproductive: Uterus and bilateral adnexa are unremarkable. Other: The none. Musculoskeletal: No acute or focal abnormality. IMPRESSION: Moderate left hydronephrosis due to a 0.6 cm stone just below the renal pelvis. The patient has 2 additional nonobstructing stone in the left kidney. 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 Two nonobstructing stones are present in the right kidney. Fatty infiltration liver. Atherosclerosis. Electronically Signed   By: Inge Rise M.D.   On: 07/31/2018 16:21   Dg Chest Port 1 View  Result Date: 07/31/2018 CLINICAL DATA:  Chest discomfort EXAM: PORTABLE CHEST 1 VIEW COMPARISON:  10/29/2017 FINDINGS: The heart size and mediastinal contours are within normal limits. Both lungs are clear. The visualized skeletal structures are unremarkable. IMPRESSION: No active disease. Electronically Signed   By: Inez Catalina M.D.   On: 07/31/2018 14:29   Dg C-arm 1-60 Min-no Report  Result Date: 08/01/2018 Fluoroscopy was utilized by the requesting physician.  No radiographic interpretation.    Scheduled Meds: . acetaminophen      . albuterol      . carvedilol  6.25 mg Oral BID WC  . enoxaparin (LOVENOX) injection  30 mg Subcutaneous  Q24H  . loratadine  10 mg Oral Daily    Admission status: Inpatient: Based on patients clinical presentation and evaluation ofabove clinical data, I have made determination that patient meets Inpatient criteria at this time.  Time spent: 20 min  [facility] Pager [phone]. If 7PM-7AM, please contact night-coverage at [email or web address], Office  [phone]  password [email or web address]  08/01/2018, 4:50 PM  LOS: 1 day

## 2018-08-01 NOTE — Op Note (Signed)
Operative Note  Preoperative diagnosis:  1.  Left ureteral calculus  Post operative diagnosis: 1.  Left ureteral calculus  Procedure(s): 1.  Cystoscopy with left retrograde pyelogram and left ureteral stent placement  Surgeon: Link Snuffer, MD  Assistants: None  Anesthesia: General  Complications: None immediate  EBL: Minimal  Specimens: 1.  None  Drains/Catheters: 1.  6 X 26 double-J ureteral stent  Intraoperative findings: 1.  Normal urethra and bladder 2.  Left retrograde pyelogram revealed a filling defect at the level of the stone with upstream hydroureteronephrosis  Indication: 58 year old female with an obstructing left ureteral calculus, leukocytosis, acute renal insufficiency presents for urgent ureteral stent placement.  Description of procedure:  The patient was identified and consent was obtained.  The patient was taken to the operating room and placed in the supine position.  The patient was placed under general anesthesia.  Perioperative antibiotics were administered.  The patient was placed in dorsal lithotomy.  Patient was prepped and draped in a standard sterile fashion and a timeout was performed.  A 21 French rigid cystoscope was advanced into the urethra and into the bladder.  The left distal most portion of the ureter was cannulated with an open-ended ureteral catheter.  Retrograde pyelogram was performed with the findings noted above.  A sensor wire was then advanced up to the kidney under fluoroscopic guidance.  A 6 X 26 double-J ureteral stent was advanced up to the kidney under fluoroscopic guidance.  The wire was withdrawn and fluoroscopy confirmed good proximal placement and direct visualization confirmed a good coil within the bladder.  The bladder was drained and the scope withdrawn.  This concluded the operation.  Patient tolerated procedure well and was stable postoperatively.  Plan: Continue antibiotics.  She will need definitive management of her  stone in about 2 weeks.

## 2018-08-01 NOTE — Interval H&P Note (Signed)
History and Physical Interval Note:  08/01/2018 10:18 AM  [name]  has presented today for surgery, with the diagnosis of left ureteral obstruction.  The various methods of treatment have been discussed with the patient and family. After consideration of risks, benefits and other options for treatment, the patient has consented to  Procedure(s): CYSTOSCOPY WITH RETROGRADE PYELOGRAM/URETERAL STENT PLACEMENT (Left) as a surgical intervention.  The patient's history has been reviewed, patient examined, no change in status, stable for surgery.  I have reviewed the patient's chart and labs.  Questions were answered to the patient's satisfaction.     Marton Redwood, III

## 2018-08-01 NOTE — Anesthesia Preprocedure Evaluation (Addendum)
Anesthesia Evaluation  Patient identified by MRN, date of birth, ID band Patient awake    Reviewed: Allergy & Precautions, NPO status , Patient's Chart, lab work & pertinent test results, reviewed documented beta blocker date and time   Airway Mallampati: II  TM Distance: >3 FB Neck ROM: Full    Dental  (+) Dental Advisory Given, Edentulous Upper, Edentulous Lower   Pulmonary asthma , COPD,  COPD inhaler, Current Smoker,    Pulmonary exam normal breath sounds clear to auscultation       Cardiovascular hypertension, Pt. on medications and Pt. on home beta blockers Normal cardiovascular exam Rhythm:Regular Rate:Normal     Neuro/Psych PSYCHIATRIC DISORDERS Anxiety Depression negative neurological ROS     GI/Hepatic GERD  ,(+)     substance abuse (Last cocaine use 07/30/2018)  cocaine use and marijuana use, Hepatitis -, C  Endo/Other  diabetes, Type 2  Renal/GU Renal InsufficiencyRenal diseaseleft ureteral obstruction     Musculoskeletal  (+) Arthritis ,   Abdominal   Peds  Hematology  (+) Blood dyscrasia, anemia ,   Anesthesia Other Findings Day of surgery medications reviewed with the patient.  Reproductive/Obstetrics                            Anesthesia Physical Anesthesia Plan  ASA: III  Anesthesia Plan: General   Post-op Pain Management:    Induction: Intravenous  PONV Risk Score and Plan: 2 and Midazolam, Dexamethasone and Ondansetron  Airway Management Planned: LMA  Additional Equipment:   Intra-op Plan:   Post-operative Plan: Extubation in OR  Informed Consent: I have reviewed the patients History and Physical, chart, labs and discussed the procedure including the risks, benefits and alternatives for the proposed anesthesia with the patient or authorized representative who has indicated his/her understanding and acceptance.     Dental advisory given  Plan Discussed  with: CRNA  Anesthesia Plan Comments:         Anesthesia Quick Evaluation

## 2018-08-01 NOTE — Transfer of Care (Signed)
Immediate Anesthesia Transfer of Care Note  Patient: [name]  Procedure(s) Performed: Procedure(s): CYSTOSCOPY WITH RETROGRADE PYELOGRAM/URETERAL STENT PLACEMENT (Left)  Patient Location: PACU  Anesthesia Type:General  Level of Consciousness:  sedated, patient cooperative and responds to stimulation  Airway & Oxygen Therapy:Patient Spontanous Breathing and Patient connected to face mask oxgen  Post-op Assessment:  Report given to PACU RN and Post -op Vital signs reviewed and stable  Post vital signs:  Reviewed and stable  Last Vitals:  Vitals:   08/01/18 0424 08/01/18 0800  BP: (!) 92/59 106/65  Pulse: 88 77  Resp: 18   Temp: 36.6 C   SpO2:  17%    Complications: No apparent anesthesia complications

## 2018-08-01 NOTE — Anesthesia Postprocedure Evaluation (Signed)
Anesthesia Post Note  Patient: [name]  Procedure(s) Performed: CYSTOSCOPY WITH RETROGRADE PYELOGRAM/URETERAL STENT PLACEMENT (Left Ureter)     Patient location during evaluation: PACU Anesthesia Type: General Level of consciousness: awake and alert Pain management: pain level controlled Vital Signs Assessment: post-procedure vital signs reviewed and stable Respiratory status: spontaneous breathing, nonlabored ventilation and respiratory function stable Cardiovascular status: blood pressure returned to baseline and stable Postop Assessment: no apparent nausea or vomiting Anesthetic complications: no    Last Vitals:  Vitals:   08/01/18 1200 08/01/18 1219  BP: 96/70 107/68  Pulse: 71 67  Resp:  14  Temp: 36.5 C 36.4 C  SpO2: 100% 99%    Last Pain:  Vitals:   08/01/18 1219  TempSrc: Axillary  PainSc:                  Catalina Gravel

## 2018-08-01 NOTE — Anesthesia Procedure Notes (Signed)
Procedure Name: LMA Insertion Date/Time: 08/01/2018 10:42 AM Performed by: Anne Fu, CRNA Pre-anesthesia Checklist: Patient identified, Emergency Drugs available, Suction available, Patient being monitored and Timeout performed Patient Re-evaluated:Patient Re-evaluated prior to induction Oxygen Delivery Method: Circle system utilized Preoxygenation: Pre-oxygenation with 100% oxygen Induction Type: IV induction Ventilation: Mask ventilation without difficulty LMA: LMA inserted LMA Size: 4.0 Number of attempts: 1 Placement Confirmation: positive ETCO2 and breath sounds checked- equal and bilateral Tube secured with: Tape

## 2018-08-02 ENCOUNTER — Encounter (HOSPITAL_COMMUNITY): Payer: Self-pay | Admitting: Urology

## 2018-08-02 LAB — CBC
HCT: 23.1 % — ABNORMAL LOW (ref 36.0–46.0)
Hemoglobin: 6.7 g/dL — CL (ref 12.0–15.0)
MCH: 27.7 pg (ref 26.0–34.0)
MCHC: 29 g/dL — ABNORMAL LOW (ref 30.0–36.0)
MCV: 95.5 fL (ref 80.0–100.0)
Platelets: 231 10*3/uL (ref 150–400)
RBC: 2.42 MIL/uL — ABNORMAL LOW (ref 3.87–5.11)
RDW: 17.2 % — ABNORMAL HIGH (ref 11.5–15.5)
WBC: 24.7 10*3/uL — ABNORMAL HIGH (ref 4.0–10.5)
nRBC: 0 % (ref 0.0–0.2)

## 2018-08-02 LAB — BASIC METABOLIC PANEL
Anion gap: 8 (ref 5–15)
BUN: 40 mg/dL — ABNORMAL HIGH (ref 6–20)
CO2: 17 mmol/L — ABNORMAL LOW (ref 22–32)
Calcium: 8.1 mg/dL — ABNORMAL LOW (ref 8.9–10.3)
Chloride: 105 mmol/L (ref 98–111)
Creatinine, Ser: 2.47 mg/dL — ABNORMAL HIGH (ref 0.44–1.00)
GFR calc Af Amer: 24 mL/min — ABNORMAL LOW (ref >60)
GFR calc non Af Amer: 21 mL/min — ABNORMAL LOW (ref >60)
Glucose, Bld: 113 mg/dL — ABNORMAL HIGH (ref 70–99)
Potassium: 5.5 mmol/L — ABNORMAL HIGH (ref 3.5–5.1)
Sodium: 130 mmol/L — ABNORMAL LOW (ref 135–145)

## 2018-08-02 LAB — RETICULOCYTES
Immature Retic Fract: 21.4 % — ABNORMAL HIGH (ref 2.3–15.9)
RBC.: 2.85 MIL/uL — ABNORMAL LOW (ref 3.87–5.11)
Retic Count, Absolute: 27.1 10*3/uL (ref 19.0–186.0)
Retic Ct Pct: 1 % (ref 0.4–3.1)

## 2018-08-02 LAB — FOLATE: Folate: 5.9 ng/mL — ABNORMAL LOW (ref >5.9)

## 2018-08-02 LAB — PREPARE RBC (CROSSMATCH)

## 2018-08-02 MED ORDER — HYDRALAZINE HCL 25 MG PO TABS
25.0000 mg | ORAL_TABLET | Freq: Four times a day (QID) | ORAL | Status: DC | PRN
  Administered 2018-08-02 – 2018-08-04 (×2): 25 mg via ORAL
  Filled 2018-08-02 (×2): qty 1

## 2018-08-02 MED ORDER — ALUM & MAG HYDROXIDE-SIMETH 200-200-20 MG/5ML PO SUSP
30.0000 mL | Freq: Four times a day (QID) | ORAL | Status: DC | PRN
  Administered 2018-08-02 – 2018-08-03 (×2): 30 mL via ORAL
  Filled 2018-08-02 (×2): qty 30

## 2018-08-02 MED ORDER — SODIUM CHLORIDE 0.9% IV SOLUTION
Freq: Once | INTRAVENOUS | Status: AC
  Administered 2018-08-02: 13:00:00 via INTRAVENOUS

## 2018-08-02 NOTE — Progress Notes (Signed)
Urology Inpatient Progress Report  Renal colic [O35] Epigastric pain [R10.13] Fever, unspecified fever cause [R50.9]  Procedure(s): CYSTOSCOPY WITH RETROGRADE PYELOGRAM/URETERAL STENT PLACEMENT  1 Day Post-Op   Intv/Subj: No acute events overnight. Patient is without complaint except for some mild left flank pain.  [name] is otherwise feeling much improved from before [name] remains on ceftriaxone.  Blood culture is positive for gram-negative rods.  Creatinine slightly improved at 2.47.  Leukocytosis slightly worse at 24.7.  Active Problems:   AKI (acute kidney injury) ([place])  Current Facility-Administered Medications  Medication Dose Route Frequency Provider Last Rate Last Dose  . 0.9 %  sodium chloride infusion (Manually program via Guardrails IV Fluids)   Intravenous Once [place], Marge Duncans, MD      . 0.9 %  sodium chloride infusion   Intravenous Continuous Oswald Hillock, MD 10 mL/hr at 08/02/18 0954    . acetaminophen (TYLENOL) tablet 650 mg  650 mg Oral Q6H PRN Oswald Hillock, MD   650 mg at 08/01/18 0139   Or  . acetaminophen (TYLENOL) suppository 650 mg  650 mg Rectal Q6H PRN Oswald Hillock, MD      . albuterol (PROVENTIL) (2.5 MG/3ML) 0.083% nebulizer solution 2.5 mg  2.5 mg Inhalation Q6H PRN Oswald Hillock, MD      . carvedilol (COREG) tablet 6.25 mg  6.25 mg Oral BID WC Oswald Hillock, MD   6.25 mg at 08/02/18 0839  . cefTRIAXone (ROCEPHIN) 2 g in sodium chloride 0.9 % 100 mL IVPB  2 g Intravenous Q24H Oswald Hillock, MD 200 mL/hr at 08/02/18 1112 2 g at 08/02/18 1112  . enoxaparin (LOVENOX) injection 30 mg  30 mg Subcutaneous Q24H Darrick Meigs, Gagan S, MD   30 mg at 08/01/18 2137  . hydrALAZINE (APRESOLINE) tablet 25 mg  25 mg Oral Q6H PRN Oswald Hillock, MD      . HYDROmorphone (DILAUDID) injection 2 mg  2 mg Intravenous Q4H PRN Oswald Hillock, MD   2 mg at 08/02/18 0857  . loratadine (CLARITIN) tablet 10 mg  10 mg Oral Daily Oswald Hillock, MD   10 mg at 08/02/18 0839  . ondansetron (ZOFRAN)  tablet 4 mg  4 mg Oral Q6H PRN Oswald Hillock, MD       Or  . ondansetron [facility]) injection 4 mg  4 mg Intravenous Q6H PRN Oswald Hillock, MD         Objective: Vital: Vitals:   08/02/18 0036 08/02/18 0552 08/02/18 0837 08/02/18 0957  BP: 123/80 (!) 144/84 (!) 170/86 (!) 146/74  Pulse: 64 (!) 56 (!) 57 62  Resp: 16 16    Temp: 97.6 F (36.4 C) (!) 97.5 F (36.4 C)    TempSrc: Oral Oral    SpO2: 99% 100%    Weight:      Height:       I/Os: I/O last 3 completed shifts: In: 2205.7 [P.O.:120; I.V.:2035.7; IV Piggyback:50] Out: 302 [Urine:300; Blood:2]  Physical Exam:  General: Patient is in no apparent distress Lungs: Normal respiratory effort, chest expands symmetrically. GI:The abdomen is soft and nontender without mass. Ext: lower extremities symmetric  Lab Results: Recent Labs    07/31/18 1256 08/01/18 0521 08/02/18 0356  WBC 14.8* 21.3* 24.7*  HGB 8.1* 7.1* 6.7*  HCT 25.2* 23.7* 23.1*   Recent Labs    07/31/18 1256 08/01/18 0521 08/02/18 0356  NA 131* 134* 130*  K 4.3 4.5 5.5*  CL 102 108  105  CO2 15* 15* 17*  GLUCOSE 83 103* 113*  BUN 30* 30* 40*  CREATININE 2.29* 2.69* 2.47*  CALCIUM 8.5* 8.2* 8.1*   Recent Labs    07/31/18 1301  INR 1.0   No results for input(s): LABURIN in the last 72 hours. Results for orders placed or performed during the hospital encounter of 07/31/18  Blood culture (routine x 2)     Status: None (Preliminary result)   Collection Time: 07/31/18  1:01 PM  Result Value Ref Range Status   Specimen Description   Final    BLOOD RIGHT ANTECUBITAL Performed at [place] [address], [place], [place] [address]    Special Requests   Final    BOTTLES DRAWN AEROBIC AND ANAEROBIC Blood Culture results may not be optimal due to an excessive volume of blood received in culture bottles   Culture  Setup Time   Final    GRAM NEGATIVE RODS ANAEROBIC BOTTLE ONLY CRITICAL RESULT CALLED TO, READ BACK BY AND  VERIFIED WITH: PHAM PHARMD AT 1000 ON [id number] BY SJW Performed at [facility], [place] [address], [place], [place] [address]    Culture GRAM NEGATIVE RODS  Final   Report Status PENDING  Incomplete  Blood Culture ID Panel (Reflexed)     Status: Abnormal   Collection Time: 07/31/18  1:01 PM  Result Value Ref Range Status   Enterococcus species NOT DETECTED NOT DETECTED Final   Listeria monocytogenes NOT DETECTED NOT DETECTED Final   Staphylococcus species NOT DETECTED NOT DETECTED Final   Staphylococcus aureus (BCID) NOT DETECTED NOT DETECTED Final   Streptococcus species NOT DETECTED NOT DETECTED Final   Streptococcus agalactiae NOT DETECTED NOT DETECTED Final   Streptococcus pneumoniae NOT DETECTED NOT DETECTED Final   Streptococcus pyogenes NOT DETECTED NOT DETECTED Final   Acinetobacter baumannii NOT DETECTED NOT DETECTED Final   Enterobacteriaceae species DETECTED (A) NOT DETECTED Final    Comment: CRITICAL RESULT CALLED TO, READ BACK BY AND VERIFIED WITH: PHAM PHARMD AT 1000 ON [id number] BY SJW    Enterobacter cloacae complex NOT DETECTED NOT DETECTED Final   Escherichia coli DETECTED (A) NOT DETECTED Final    Comment: CRITICAL RESULT CALLED TO, READ BACK BY AND VERIFIED WITH: PHAM PHARMD AT 1000 ON [id number] BY SJW    Klebsiella oxytoca NOT DETECTED NOT DETECTED Final   Klebsiella pneumoniae DETECTED (A) NOT DETECTED Final    Comment: CRITICAL RESULT CALLED TO, READ BACK BY AND VERIFIED WITH: PHAM PHARMD AT 1000 ON [id number] BY SJW    Proteus species NOT DETECTED NOT DETECTED Final   Serratia marcescens NOT DETECTED NOT DETECTED Final   Carbapenem resistance NOT DETECTED NOT DETECTED Final   Haemophilus influenzae NOT DETECTED NOT DETECTED Final   Neisseria meningitidis NOT DETECTED NOT DETECTED Final   Pseudomonas aeruginosa NOT DETECTED NOT DETECTED Final   Candida albicans NOT DETECTED NOT DETECTED Final   Candida glabrata NOT DETECTED NOT DETECTED Final   Candida krusei NOT  DETECTED NOT DETECTED Final   Candida parapsilosis NOT DETECTED NOT DETECTED Final   Candida tropicalis NOT DETECTED NOT DETECTED Final    Comment: Performed at [facility], [address] [place], [place] [address]  Blood culture (routine x 2)     Status: None (Preliminary result)   Collection Time: 07/31/18  1:06 PM  Result Value Ref Range Status   Specimen Description   Final    BLOOD LEFT ANTECUBITAL Performed at [facility]  [place] [address] [place], [place] [address]    Special Requests   Final    BOTTLES DRAWN AEROBIC AND ANAEROBIC Blood Culture adequate volume   Culture  Setup Time   Final    GRAM NEGATIVE RODS IN BOTH AEROBIC AND ANAEROBIC BOTTLES CRITICAL VALUE NOTED.  VALUE IS CONSISTENT WITH PREVIOUSLY REPORTED AND CALLED VALUE. Performed at [facility], [place] [address] [place], [place] [address]    Culture GRAM NEGATIVE RODS  Final   Report Status PENDING  Incomplete  SARS Coronavirus 2 (CEPHEID - Performed in [place] hospital lab), Hosp Order     Status: None   Collection Time: 07/31/18  5:38 PM  Result Value Ref Range Status   SARS Coronavirus 2 NEGATIVE NEGATIVE Final    Comment: (NOTE) If result is NEGATIVE SARS-CoV-2 target nucleic acids are NOT DETECTED. The SARS-CoV-2 RNA is generally detectable in upper and lower  respiratory specimens during the acute phase of infection. The lowest  concentration of SARS-CoV-2 viral copies this assay can detect is 250  copies / mL. A negative result does not preclude SARS-CoV-2 infection  and should not be used as the sole basis for treatment or other  patient management decisions.  A negative result may occur with  improper specimen collection / handling, submission of specimen other  than nasopharyngeal swab, presence of viral mutation(s) within the  areas targeted by this assay, and inadequate number of viral copies  (<250 copies / mL). A negative result must be combined with clinical   observations, patient history, and epidemiological information. If result is POSITIVE SARS-CoV-2 target nucleic acids are DETECTED. The SARS-CoV-2 RNA is generally detectable in upper and lower  respiratory specimens dur ing the acute phase of infection.  Positive  results are indicative of active infection with SARS-CoV-2.  Clinical  correlation with patient history and other diagnostic information is  necessary to determine patient infection status.  Positive results do  not rule out bacterial infection or co-infection with other viruses. If result is PRESUMPTIVE POSTIVE SARS-CoV-2 nucleic acids MAY BE PRESENT.   A presumptive positive result was obtained on the submitted specimen  and confirmed on repeat testing.  While 2019 novel coronavirus  (SARS-CoV-2) nucleic acids may be present in the submitted sample  additional confirmatory testing may be necessary for epidemiological  and / or clinical management purposes  to differentiate between  SARS-CoV-2 and other Sarbecovirus currently known to infect humans.  If clinically indicated additional testing with an alternate test  methodology [phone]) is advised. The SARS-CoV-2 RNA is generally  detectable in upper and lower respiratory sp ecimens during the acute  phase of infection. The expected result is Negative. Fact Sheet for Patients:  StrictlyIdeas.no Fact Sheet for Healthcare Providers: BankingDealers.co.za This test is not yet approved or cleared by the [place] FDA and has been authorized for detection and/or diagnosis of SARS-CoV-2 by FDA under an Emergency Use Authorization (EUA).  This EUA will remain in effect (meaning this test can be used) for the duration of the COVID-19 declaration under Section 564(b)(1) of the Act, 21 U.S.C. section 360bbb-3(b)(1), unless the authorization is terminated or revoked sooner. Performed at [facility], [place]  [address] [place], [place] [address]   Culture, Urine     Status: Abnormal (Preliminary result)   Collection Time: 07/31/18  5:38 PM  Result Value Ref Range Status   Specimen Description   Final    URINE, RANDOM Performed at  [facility], [place] [address], [place], [place] [address]    Special Requests   Final    NONE Performed at [facility], [place] [address], [place], [place] [address]    Culture (A)  Final    >=100,000 COLONIES/mL GRAM NEGATIVE RODS CULTURE REINCUBATED FOR BETTER GROWTH Performed at [facility], [place] [address] [place],  [address]    Report Status PENDING  Incomplete    Studies/Results: Ct Abdomen Pelvis Wo Contrast  Result Date: 07/31/2018 CLINICAL DATA:  Left side abdominal pain for 3 weeks. EXAM: CT ABDOMEN AND PELVIS WITHOUT CONTRAST TECHNIQUE: Multidetector CT imaging of the abdomen and pelvis was performed following the standard protocol without IV contrast. COMPARISON:  None. FINDINGS: Lower chest: Dependent atelectasis is identified. There is no pleural or pericardial effusion. Hepatobiliary: Mild fatty infiltration of the liver without focal lesion. Gallbladder and biliary tree are unremarkable. Pancreas: Unremarkable. No pancreatic ductal dilatation or surrounding inflammatory changes. Spleen: Normal in size without focal abnormality. Adrenals/Urinary Tract: The patient has moderate left hydronephrosis due to a 0.6 cm stone just below the renal pelvis. 2 nonobstructing stones are seen in the left kidney measuring 0.5 cm in the lower pole and 1.1 cm in the upper pole. There is extensive stranding about the left kidney and proximal ureter. The right kidney is ptotic with an abnormal axis of orientation. The pelvis is directed anteriorly. Duplicated right renal collecting system is noted. Two nonobstructing stones are seen in the right kidney. The larger measures 0.5 cm. Urinary bladder is unremarkable. The adrenal  glands appear normal. Stomach/Bowel: Stomach is within normal limits. Appendix appears normal. No evidence of bowel wall thickening, distention, or inflammatory changes. Vascular/Lymphatic: Aortic atherosclerosis. No enlarged abdominal or pelvic lymph nodes. Reproductive: Uterus and bilateral adnexa are unremarkable. Other: The none. Musculoskeletal: No acute or focal abnormality. IMPRESSION: Moderate left hydronephrosis due to a 0.6 cm stone just below the renal pelvis. The patient has 2 additional nonobstructing stone in the left kidney. 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 Two nonobstructing stones are present in the right kidney. Fatty infiltration liver. Atherosclerosis. Electronically Signed   By: Inge Rise M.D.   On: 07/31/2018 16:21   Dg Chest Port 1 View  Result Date: 07/31/2018 CLINICAL DATA:  Chest discomfort EXAM: PORTABLE CHEST 1 VIEW COMPARISON:  10/29/2017 FINDINGS: The heart size and mediastinal contours are within normal limits. Both lungs are clear. The visualized skeletal structures are unremarkable. IMPRESSION: No active disease. Electronically Signed   By: Inez Catalina M.D.   On: 07/31/2018 14:29   Dg C-arm 1-60 Min-no Report  Result Date: 08/01/2018 Fluoroscopy was utilized by the requesting physician.  No radiographic interpretation.    Assessment: Left ureteral calculus Sepsis secondary to urinary tract infection  Procedure(s): CYSTOSCOPY WITH RETROGRADE PYELOGRAM/URETERAL STENT PLACEMENT, 1 Day Post-Op  doing well.  Plan: Agree with broad-spectrum antibiotic until sensitivities return.  [name] will need definitive management of her stone in about 2 weeks   Link Snuffer, MD Urology 08/02/2018, 11:21 AM

## 2018-08-02 NOTE — Progress Notes (Signed)
CRITICAL VALUE ALERT  Critical Value: Hgb 6.8  Date & Time Notied:  5/17, 0456 Provider Notified: [place]   Orders Received/Actions taken: Awaiting

## 2018-08-02 NOTE — Plan of Care (Signed)

## 2018-08-02 NOTE — Progress Notes (Signed)
[facility]  PROGRESS NOTE  [name] IOX:[id number] DOB: 1960/06/17 DOA: 07/31/2018 PCP: Ladell Pier, MD   Brief HPI:   58 year old female with a history of alcohol abuse, asthma, depression, hypertension, CKD stage III came to hospital with left-sided flank pain for past 3 days.  CT of the abdomen pelvis showed left hydronephrosis with 0.6 cm stone just below renal pelvis.  Urology was consulted.   Subjective   Patient seen and examined, denies flank pain.  Status post cystoscopy and double-J ureteral stent placement yesterday.  Hemoglobin this morning was 6.8.  1 unit PRBC ordered.   Assessment/Plan:     1. Gram-negative rod bacteremia-blood cultures 2/4 growing gram-negative rods. Continue  ceftriaxone  2 g IV every 24 hours. Await final culture results.  2. Left ureteral stone-status post cystoscopy and ureteral stent placement as per urology.  Urology following.  Continue pain management.  Urology to follow in 2 weeks for definitive stone treatment.  3. Anemia of chronic disease-likely due to underlying CKD stage III.  Hemoglobin is 6.7 this morning.  1 unit PRBC has been ordered.  Follow CBC in a.m.  Will order FOBT.  Check anemia panel.  4. Hyperkalemia-potassium is 5.5, likely from worsening renal function.  She is not on potassium supplementation.  Follow serum potassium level in a.m.  5. UTI-patient is on IV ceftriaxone.  Final urine culture result is pending.  6. Hypertension-blood pressure is stable, continue Coreg.  7. Acute kidney injury on CKD stage III-secondary postobstructive nephropathy due to renal stone.  Baseline creatinine around 1.5.  Creatinine is slowly improving, today creatinine is 2.47, down from 2.69 yesterday.  Follow BMP in a.m.  8. History of asthma-stable, not in exacerbation.  SARS-CoV-2 test is negative.       CBG: Recent Labs  Lab 07/31/18 2010 07/31/18 2353 08/01/18 0418 08/01/18 0731 08/01/18 1625  GLUCAP 112* 97  107* 98 158*    CBC: Recent Labs  Lab 07/31/18 1256 08/01/18 0521 08/02/18 0356  WBC 14.8* 21.3* 24.7*  HGB 8.1* 7.1* 6.7*  HCT 25.2* 23.7* 23.1*  MCV 90.0 94.0 95.5  PLT 375 261 268    Basic Metabolic Panel: Recent Labs  Lab 07/31/18 1256 08/01/18 0521 08/02/18 0356  NA 131* 134* 130*  K 4.3 4.5 5.5*  CL 102 108 105  CO2 15* 15* 17*  GLUCOSE 83 103* 113*  BUN 30* 30* 40*  CREATININE 2.29* 2.69* 2.47*  CALCIUM 8.5* 8.2* 8.1*     DVT prophylaxis: Lovenox  Code Status: Full code  Family Communication: No family at bedside  Disposition Plan: likely home when medically ready for discharge     Consultants:  Urology  Procedures:  Left ureteral stent placement   Antibiotics:   Anti-infectives (From admission, onward)   Start     Dose/Rate Route Frequency Ordered Stop   08/01/18 1200  cefTRIAXone (ROCEPHIN) 2 g in sodium chloride 0.9 % 100 mL IVPB     2 g 200 mL/hr over 30 Minutes Intravenous Every 24 hours 08/01/18 1012     08/01/18 0923  sodium chloride 0.9 % with cefTRIAXone (ROCEPHIN) ADS Med    Note to Pharmacy:  Guerry Bruin   : cabinet override      08/01/18 0923 08/01/18 2129   07/31/18 2100  cefTRIAXone (ROCEPHIN) 1 g in sodium chloride 0.9 % 100 mL IVPB  Status:  Discontinued     1 g 200 mL/hr over 30 Minutes Intravenous Every 24 hours 07/31/18 1958  08/01/18 1012   07/31/18 1830  cefTRIAXone (ROCEPHIN) 1 g in sodium chloride 0.9 % 100 mL IVPB  Status:  Discontinued     1 g 200 mL/hr over 30 Minutes Intravenous  Once 07/31/18 1820 07/31/18 2002       Objective   Vitals:   08/02/18 0036 08/02/18 0552 08/02/18 0837 08/02/18 0957  BP: 123/80 (!) 144/84 (!) 170/86 (!) 146/74  Pulse: 64 (!) 56 (!) 57 62  Resp: 16 16    Temp: 97.6 F (36.4 C) (!) 97.5 F (36.4 C)    TempSrc: Oral Oral    SpO2: 99% 100%    Weight:      Height:        Intake/Output Summary (Last 24 hours) at 08/02/2018 1226 Last data filed at 08/02/2018 1100 Gross  per 24 hour  Intake 2615.72 ml  Output 850 ml  Net 1765.72 ml   Filed Weights   07/31/18 1242  Weight: 64.4 kg     Physical Examination:  General-appears in no acute distress Heart-S1-S2, regular, no murmur auscultated Lungs-clear to auscultation bilaterally, no wheezing or crackles auscultated Abdomen-soft, nontender, no organomegaly Extremities-no edema in the lower extremities Neuro-alert, oriented x3, no focal deficit noted   Data Reviewed: I have personally reviewed following labs and imaging studies   Recent Results (from the past 240 hour(s))  Blood culture (routine x 2)     Status: None (Preliminary result)   Collection Time: 07/31/18  1:01 PM  Result Value Ref Range Status   Specimen Description   Final    BLOOD RIGHT ANTECUBITAL Performed at [facility], [place] [address] [place], [place] [address]    Special Requests   Final    BOTTLES DRAWN AEROBIC AND ANAEROBIC Blood Culture results may not be optimal due to an excessive volume of blood received in culture bottles   Culture  Setup Time   Final    GRAM NEGATIVE RODS ANAEROBIC BOTTLE ONLY CRITICAL RESULT CALLED TO, READ BACK BY AND VERIFIED WITH: PHAM PHARMD AT 1000 ON [id number] BY SJW Performed at [facility], [place] [address] [place], [place] [address]    Culture GRAM NEGATIVE RODS  Final   Report Status PENDING  Incomplete  Blood Culture ID Panel (Reflexed)     Status: Abnormal   Collection Time: 07/31/18  1:01 PM  Result Value Ref Range Status   Enterococcus species NOT DETECTED NOT DETECTED Final   Listeria monocytogenes NOT DETECTED NOT DETECTED Final   Staphylococcus species NOT DETECTED NOT DETECTED Final   Staphylococcus aureus (BCID) NOT DETECTED NOT DETECTED Final   Streptococcus species NOT DETECTED NOT DETECTED Final   Streptococcus agalactiae NOT DETECTED NOT DETECTED Final   Streptococcus pneumoniae NOT DETECTED NOT DETECTED Final   Streptococcus pyogenes NOT DETECTED  NOT DETECTED Final   Acinetobacter baumannii NOT DETECTED NOT DETECTED Final   Enterobacteriaceae species DETECTED (A) NOT DETECTED Final    Comment: CRITICAL RESULT CALLED TO, READ BACK BY AND VERIFIED WITH: PHAM PHARMD AT 1000 ON [id number] BY SJW    Enterobacter cloacae complex NOT DETECTED NOT DETECTED Final   Escherichia coli DETECTED (A) NOT DETECTED Final    Comment: CRITICAL RESULT CALLED TO, READ BACK BY AND VERIFIED WITH: PHAM PHARMD AT 1000 ON [id number] BY SJW    Klebsiella oxytoca NOT DETECTED NOT DETECTED Final   Klebsiella pneumoniae DETECTED (A) NOT DETECTED Final    Comment: CRITICAL RESULT CALLED TO, READ BACK BY AND VERIFIED WITH:  PHAM PHARMD AT 1000 ON [id number] BY SJW    Proteus species NOT DETECTED NOT DETECTED Final   Serratia marcescens NOT DETECTED NOT DETECTED Final   Carbapenem resistance NOT DETECTED NOT DETECTED Final   Haemophilus influenzae NOT DETECTED NOT DETECTED Final   Neisseria meningitidis NOT DETECTED NOT DETECTED Final   Pseudomonas aeruginosa NOT DETECTED NOT DETECTED Final   Candida albicans NOT DETECTED NOT DETECTED Final   Candida glabrata NOT DETECTED NOT DETECTED Final   Candida krusei NOT DETECTED NOT DETECTED Final   Candida parapsilosis NOT DETECTED NOT DETECTED Final   Candida tropicalis NOT DETECTED NOT DETECTED Final    Comment: Performed at [facility], [place] [address] [place], [place] [address]  Blood culture (routine x 2)     Status: None (Preliminary result)   Collection Time: 07/31/18  1:06 PM  Result Value Ref Range Status   Specimen Description   Final    BLOOD LEFT ANTECUBITAL Performed at [place] [address], [place], [place] [address]    Special Requests   Final    BOTTLES DRAWN AEROBIC AND ANAEROBIC Blood Culture adequate volume   Culture  Setup Time   Final    GRAM NEGATIVE RODS IN BOTH AEROBIC AND ANAEROBIC BOTTLES CRITICAL VALUE NOTED.  VALUE IS CONSISTENT WITH PREVIOUSLY REPORTED AND  CALLED VALUE. Performed at [facility], [place] [address] [place], [place] [address]    Culture GRAM NEGATIVE RODS  Final   Report Status PENDING  Incomplete  SARS Coronavirus 2 (CEPHEID - Performed in [facility] lab), Hosp Order     Status: None   Collection Time: 07/31/18  5:38 PM  Result Value Ref Range Status   SARS Coronavirus 2 NEGATIVE NEGATIVE Final    Comment: (NOTE) If result is NEGATIVE SARS-CoV-2 target nucleic acids are NOT DETECTED. The SARS-CoV-2 RNA is generally detectable in upper and lower  respiratory specimens during the acute phase of infection. The lowest  concentration of SARS-CoV-2 viral copies this assay can detect is 250  copies / mL. A negative result does not preclude SARS-CoV-2 infection  and should not be used as the sole basis for treatment or other  patient management decisions.  A negative result may occur with  improper specimen collection / handling, submission of specimen other  than nasopharyngeal swab, presence of viral mutation(s) within the  areas targeted by this assay, and inadequate number of viral copies  (<250 copies / mL). A negative result must be combined with clinical  observations, patient history, and epidemiological information. If result is POSITIVE SARS-CoV-2 target nucleic acids are DETECTED. The SARS-CoV-2 RNA is generally detectable in upper and lower  respiratory specimens dur ing the acute phase of infection.  Positive  results are indicative of active infection with SARS-CoV-2.  Clinical  correlation with patient history and other diagnostic information is  necessary to determine patient infection status.  Positive results do  not rule out bacterial infection or co-infection with other viruses. If result is PRESUMPTIVE POSTIVE SARS-CoV-2 nucleic acids MAY BE PRESENT.   A presumptive positive result was obtained on the submitted specimen  and confirmed on repeat testing.  While 2019 novel coronavirus   (SARS-CoV-2) nucleic acids may be present in the submitted sample  additional confirmatory testing may be necessary for epidemiological  and / or clinical management purposes  to differentiate between  SARS-CoV-2 and other Sarbecovirus currently known to infect humans.  If clinically indicated additional testing  with an alternate test  methodology [id number]) is advised. The SARS-CoV-2 RNA is generally  detectable in upper and lower respiratory sp ecimens during the acute  phase of infection. The expected result is Negative. Fact Sheet for Patients:  StrictlyIdeas.no Fact Sheet for Healthcare Providers: BankingDealers.co.za This test is not yet approved or cleared by the [place] FDA and has been authorized for detection and/or diagnosis of SARS-CoV-2 by FDA under an Emergency Use Authorization (EUA).  This EUA will remain in effect (meaning this test can be used) for the duration of the COVID-19 declaration under Section 564(b)(1) of the Act, 21 U.S.C. section 360bbb-3(b)(1), unless the authorization is terminated or revoked sooner. Performed at [facility], [place] [address] [place], [place] [address]   Culture, Urine     Status: Abnormal (Preliminary result)   Collection Time: 07/31/18  5:38 PM  Result Value Ref Range Status   Specimen Description   Final    URINE, RANDOM Performed at [place] [address], [place], [place] [address]    Special Requests   Final    NONE Performed at [facility], [place] [address], [place], [place] [address]    Culture (A)  Final    >=100,000 COLONIES/mL GRAM NEGATIVE RODS CULTURE REINCUBATED FOR BETTER GROWTH Performed at [facility], [place] [address], [place], [place] [address]    Report Status PENDING  Incomplete     Liver Function Tests: Recent Labs  Lab 07/31/18 1256 08/01/18 0521  AST 31 25  ALT 19 15  ALKPHOS 91  82  BILITOT 0.3 0.3  PROT 7.8 6.6  ALBUMIN 3.0* 2.5*   Recent Labs  Lab 07/31/18 1256  LIPASE 38   No results for input(s): AMMONIA in the last 168 hours.  Cardiac Enzymes: Recent Labs  Lab 07/31/18 1301  TROPONINI <0.03   BNP (last 3 results) No results for input(s): BNP in the last 8760 hours.  ProBNP (last 3 results) No results for input(s): PROBNP in the last 8760 hours.    Studies: Ct Abdomen Pelvis Wo Contrast  Result Date: 07/31/2018 CLINICAL DATA:  Left side abdominal pain for 3 weeks. EXAM: CT ABDOMEN AND PELVIS WITHOUT CONTRAST TECHNIQUE: Multidetector CT imaging of the abdomen and pelvis was performed following the standard protocol without IV contrast. COMPARISON:  None. FINDINGS: Lower chest: Dependent atelectasis is identified. There is no pleural or pericardial effusion. Hepatobiliary: Mild fatty infiltration of the liver without focal lesion. Gallbladder and biliary tree are unremarkable. Pancreas: Unremarkable. No pancreatic ductal dilatation or surrounding inflammatory changes. Spleen: Normal in size without focal abnormality. Adrenals/Urinary Tract: The patient has moderate left hydronephrosis due to a 0.6 cm stone just below the renal pelvis. 2 nonobstructing stones are seen in the left kidney measuring 0.5 cm in the lower pole and 1.1 cm in the upper pole. There is extensive stranding about the left kidney and proximal ureter. The right kidney is ptotic with an abnormal axis of orientation. The pelvis is directed anteriorly. Duplicated right renal collecting system is noted. Two nonobstructing stones are seen in the right kidney. The larger measures 0.5 cm. Urinary bladder is unremarkable. The adrenal glands appear normal. Stomach/Bowel: Stomach is within normal limits. Appendix appears normal. No evidence of bowel wall thickening, distention, or inflammatory changes. Vascular/Lymphatic: Aortic atherosclerosis. No enlarged abdominal or pelvic lymph nodes.  Reproductive: Uterus and bilateral adnexa are unremarkable. Other: The none. Musculoskeletal: No acute or focal abnormality. IMPRESSION: Moderate left hydronephrosis due  to a 0.6 cm stone just below the renal pelvis. The patient has 2 additional nonobstructing stone in the left kidney. 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 Two nonobstructing stones are present in the right kidney. Fatty infiltration liver. Atherosclerosis. Electronically Signed   By: Inge Rise M.D.   On: 07/31/2018 16:21   Dg Chest Port 1 View  Result Date: 07/31/2018 CLINICAL DATA:  Chest discomfort EXAM: PORTABLE CHEST 1 VIEW COMPARISON:  10/29/2017 FINDINGS: The heart size and mediastinal contours are within normal limits. Both lungs are clear. The visualized skeletal structures are unremarkable. IMPRESSION: No active disease. Electronically Signed   By: Inez Catalina M.D.   On: 07/31/2018 14:29   Dg C-arm 1-60 Min-no Report  Result Date: 08/01/2018 Fluoroscopy was utilized by the requesting physician.  No radiographic interpretation.    Scheduled Meds: . sodium chloride   Intravenous Once  . carvedilol  6.25 mg Oral BID WC  . enoxaparin (LOVENOX) injection  30 mg Subcutaneous Q24H  . loratadine  10 mg Oral Daily    Admission status: Inpatient: Based on patients clinical presentation and evaluation ofabove clinical data, I have made determination that patient meets Inpatient criteria at this time.  Time spent: 20 min  [facility] Pager [phone]. If 7PM-7AM, please contact night-coverage at [email or web address], Office  [phone]  password [email or web address]  08/02/2018, 12:26 PM  LOS: 2 days

## 2018-08-03 LAB — CBC
HCT: 27.3 % — ABNORMAL LOW (ref 36.0–46.0)
Hemoglobin: 8.5 g/dL — ABNORMAL LOW (ref 12.0–15.0)
MCH: 28.4 pg (ref 26.0–34.0)
MCHC: 31.1 g/dL (ref 30.0–36.0)
MCV: 91.3 fL (ref 80.0–100.0)
Platelets: 272 10*3/uL (ref 150–400)
RBC: 2.99 MIL/uL — ABNORMAL LOW (ref 3.87–5.11)
RDW: 16.6 % — ABNORMAL HIGH (ref 11.5–15.5)
WBC: 20.6 10*3/uL — ABNORMAL HIGH (ref 4.0–10.5)
nRBC: 0 % (ref 0.0–0.2)

## 2018-08-03 LAB — BASIC METABOLIC PANEL
Anion gap: 9 (ref 5–15)
BUN: 40 mg/dL — ABNORMAL HIGH (ref 6–20)
CO2: 18 mmol/L — ABNORMAL LOW (ref 22–32)
Calcium: 8.5 mg/dL — ABNORMAL LOW (ref 8.9–10.3)
Chloride: 105 mmol/L (ref 98–111)
Creatinine, Ser: 2.14 mg/dL — ABNORMAL HIGH (ref 0.44–1.00)
GFR calc Af Amer: 29 mL/min — ABNORMAL LOW (ref >60)
GFR calc non Af Amer: 25 mL/min — ABNORMAL LOW (ref >60)
Glucose, Bld: 98 mg/dL (ref 70–99)
Potassium: 4.4 mmol/L (ref 3.5–5.1)
Sodium: 132 mmol/L — ABNORMAL LOW (ref 135–145)

## 2018-08-03 LAB — URINE CULTURE: Culture: >=100000 — AB

## 2018-08-03 LAB — CULTURE, BLOOD (ROUTINE X 2): Special Requests: ADEQUATE

## 2018-08-03 LAB — TYPE AND SCREEN
ABO/RH(D): A POS
Antibody Screen: NEGATIVE
Unit division: 0

## 2018-08-03 LAB — BPAM RBC
Blood Product Expiration Date: 202006062359
ISSUE DATE / TIME: 202005171242
Unit Type and Rh: 6200

## 2018-08-03 LAB — IRON AND TIBC
Iron: 26 ug/dL — ABNORMAL LOW (ref 28–170)
Saturation Ratios: 7 % — ABNORMAL LOW (ref 10.4–31.8)
TIBC: 390 ug/dL (ref 250–450)
UIBC: 364 ug/dL

## 2018-08-03 LAB — FERRITIN: Ferritin: 33 ng/mL (ref 11–307)

## 2018-08-03 LAB — VITAMIN B12: Vitamin B-12: 658 pg/mL (ref 180–914)

## 2018-08-03 MED ORDER — NICOTINE 21 MG/24HR TD PT24
21.0000 mg | MEDICATED_PATCH | Freq: Every day | TRANSDERMAL | Status: DC
  Administered 2018-08-03 – 2018-08-04 (×2): 21 mg via TRANSDERMAL
  Filled 2018-08-03 (×2): qty 1

## 2018-08-03 MED ORDER — BISACODYL 10 MG RE SUPP: 10.0000 mg | Freq: Every day | RECTAL | Status: DC | PRN

## 2018-08-03 MED ORDER — HYDROMORPHONE HCL 1 MG/ML IJ SOLN
0.5000 mg | INTRAMUSCULAR | Status: DC | PRN
  Administered 2018-08-03 – 2018-08-04 (×5): 0.5 mg via INTRAVENOUS
  Filled 2018-08-03 (×5): qty 0.5

## 2018-08-03 NOTE — Progress Notes (Signed)
[facility]  PROGRESS NOTE  [name] IOE:[id number] DOB: April 16, 1960 DOA: 07/31/2018 PCP: Ladell Pier, MD   Brief HPI:   58 year old female with a history of alcohol abuse, asthma, depression, hypertension, CKD stage III came to hospital with left-sided flank pain for past 3 days.  CT of the abdomen pelvis showed left hydronephrosis with 0.6 cm stone just below renal pelvis.  Urology was consulted. Patient seen and examined, denies flank pain.  Status post cystoscopy and double-J ureteral stent placement yesterday.  Hemoglobin on 08/02/2018 was 6.7.  1 unit PRBC was given.  Subjective   This morning patient denies any flank pain.  Feels better.  Renal function slowly improving.  Not back to baseline   Assessment/Plan:    1. Gram-negative rod bacteremia-blood cultures 2/4 growing gram-negative rods. Continue  ceftriaxone  2 g IV every 24 hours.  Blood cultures growing E. coli and Klebsiella pneumoniae, sensitive to ceftriaxone.  2. Left ureteral stone-status post cystoscopy and ureteral stent placement as per urology.  Urology following.  Continue pain management.  Urology to follow in 2 weeks for definitive stone treatment.  3. Anemia of chronic disease-likely due to underlying CKD stage III.  Hemoglobin was 6.7 yesterday and 1 unit PRBC was ordered.  Today hemoglobin is improved to 8.2.  FOBT is pending.  Anemia panel showed severe iron deficiency anemia with 7% saturation of iron.Follow CBC in a.m.  4. Hyperkalemia-potassium was 5.5 on 08/02/2018, it has improved to 4.4 today.  5. UTI-patient is on IV ceftriaxone.  Urine culture growing E. coli and Klebsiella pneumonia, sensitive to ceftriaxone.  6. Hypertension-blood pressure is stable, continue Coreg.  7. Acute kidney injury on CKD stage III-secondary postobstructive nephropathy due to renal stone.  Baseline creatinine around 1.5.  Creatinine is slowly improving, today creatinine is 2.15, down from 2.47 yesterday.   Follow BMP in a.m.  8. History of asthma-stable, not in exacerbation. SARS-CoV-2 test is negative.    CBG: Recent Labs  Lab 07/31/18 2010 07/31/18 2353 08/01/18 0418 08/01/18 0731 08/01/18 1625  GLUCAP 112* 97 107* 98 158*    CBC: Recent Labs  Lab 07/31/18 1256 08/01/18 0521 08/02/18 0356 08/03/18 0320  WBC 14.8* 21.3* 24.7* 20.6*  HGB 8.1* 7.1* 6.7* 8.5*  HCT 25.2* 23.7* 23.1* 27.3*  MCV 90.0 94.0 95.5 91.3  PLT 375 261 231 182    Basic Metabolic Panel: Recent Labs  Lab 07/31/18 1256 08/01/18 0521 08/02/18 0356 08/03/18 0320  NA 131* 134* 130* 132*  K 4.3 4.5 5.5* 4.4  CL 102 108 105 105  CO2 15* 15* 17* 18*  GLUCOSE 83 103* 113* 98  BUN 30* 30* 40* 40*  CREATININE 2.29* 2.69* 2.47* 2.14*  CALCIUM 8.5* 8.2* 8.1* 8.5*     DVT prophylaxis: Lovenox  Code Status: Full code  Family Communication: No family at bedside  Disposition Plan: likely home when medically ready for discharge     Consultants:  Urology  Procedures:  Left ureteral stent placement   Antibiotics:   Anti-infectives (From admission, onward)   Start     Dose/Rate Route Frequency Ordered Stop   08/01/18 1200  cefTRIAXone (ROCEPHIN) 2 g in sodium chloride 0.9 % 100 mL IVPB     2 g 200 mL/hr over 30 Minutes Intravenous Every 24 hours 08/01/18 1012     08/01/18 0923  sodium chloride 0.9 % with cefTRIAXone (ROCEPHIN) ADS Med    Note to Pharmacy:  Guerry Bruin   : cabinet override  08/01/18 0923 08/01/18 2129   07/31/18 2100  cefTRIAXone (ROCEPHIN) 1 g in sodium chloride 0.9 % 100 mL IVPB  Status:  Discontinued     1 g 200 mL/hr over 30 Minutes Intravenous Every 24 hours 07/31/18 1958 08/01/18 1012   07/31/18 1830  cefTRIAXone (ROCEPHIN) 1 g in sodium chloride 0.9 % 100 mL IVPB  Status:  Discontinued     1 g 200 mL/hr over 30 Minutes Intravenous  Once 07/31/18 1820 07/31/18 2002       Objective   Vitals:   08/02/18 2233 08/03/18 0451 08/03/18 1026 08/03/18 1220   BP: (!) 157/89 (!) 149/82  119/83  Pulse:  72  73  Resp:  18  18  Temp:  98.6 F (37 C)  98.9 F (37.2 C)  TempSrc:  Oral  Oral  SpO2:  100% 100% 100%  Weight:      Height:        Intake/Output Summary (Last 24 hours) at 08/03/2018 1638 Last data filed at 08/03/2018 0930 Gross per 24 hour  Intake 1109.19 ml  Output 2200 ml  Net -1090.81 ml   Filed Weights   07/31/18 1242  Weight: 64.4 kg     Physical Examination:  General-appears in no acute distress Heart-S1-S2, regular, no murmur auscultated Lungs-clear to auscultation bilaterally, no wheezing or crackles auscultated Abdomen-soft, nontender, no organomegaly Extremities-no edema in the lower extremities Neuro-alert, oriented x3, no focal deficit noted  Data Reviewed: I have personally reviewed following labs and imaging studies   Recent Results (from the past 240 hour(s))  Blood culture (routine x 2)     Status: Abnormal   Collection Time: 07/31/18  1:01 PM  Result Value Ref Range Status   Specimen Description   Final    BLOOD RIGHT ANTECUBITAL Performed at [facility], [address], [place], [place] [address]    Special Requests   Final    BOTTLES DRAWN AEROBIC AND ANAEROBIC Blood Culture results may not be optimal due to an excessive volume of blood received in culture bottles   Culture  Setup Time   Final    GRAM NEGATIVE RODS ANAEROBIC BOTTLE ONLY CRITICAL RESULT CALLED TO, READ BACK BY AND VERIFIED WITH: PHAM PHARMD AT 1000 ON [id number] BY SJW    Culture (A)  Final    ESCHERICHIA COLI KLEBSIELLA PNEUMONIAE SUSCEPTIBILITIES PERFORMED ON PREVIOUS CULTURE WITHIN THE LAST 5 DAYS. Performed at [facility], [place] [address], [place], [place] [address]    Report Status 08/03/2018 FINAL  Final   Organism ID, Bacteria ESCHERICHIA COLI  Final      Susceptibility   Escherichia coli - MIC*    AMPICILLIN 16 INTERMEDIATE Intermediate     CEFAZOLIN <=4 SENSITIVE Sensitive     CEFEPIME <=1  SENSITIVE Sensitive     CEFTAZIDIME <=1 SENSITIVE Sensitive     CEFTRIAXONE <=1 SENSITIVE Sensitive     CIPROFLOXACIN <=0.25 SENSITIVE Sensitive     GENTAMICIN <=1 SENSITIVE Sensitive     IMIPENEM <=0.25 SENSITIVE Sensitive     TRIMETH/SULFA >=320 RESISTANT Resistant     AMPICILLIN/SULBACTAM 8 SENSITIVE Sensitive     PIP/TAZO <=4 SENSITIVE Sensitive     Extended ESBL NEGATIVE Sensitive     * ESCHERICHIA COLI  Blood Culture ID Panel (Reflexed)     Status: Abnormal   Collection Time: 07/31/18  1:01 PM  Result Value Ref Range Status   Enterococcus species NOT DETECTED NOT DETECTED Final   Listeria monocytogenes  NOT DETECTED NOT DETECTED Final   Staphylococcus species NOT DETECTED NOT DETECTED Final   Staphylococcus aureus (BCID) NOT DETECTED NOT DETECTED Final   Streptococcus species NOT DETECTED NOT DETECTED Final   Streptococcus agalactiae NOT DETECTED NOT DETECTED Final   Streptococcus pneumoniae NOT DETECTED NOT DETECTED Final   Streptococcus pyogenes NOT DETECTED NOT DETECTED Final   Acinetobacter baumannii NOT DETECTED NOT DETECTED Final   Enterobacteriaceae species DETECTED (A) NOT DETECTED Final    Comment: CRITICAL RESULT CALLED TO, READ BACK BY AND VERIFIED WITH: PHAM PHARMD AT 1000 ON [id number] BY SJW    Enterobacter cloacae complex NOT DETECTED NOT DETECTED Final   Escherichia coli DETECTED (A) NOT DETECTED Final    Comment: CRITICAL RESULT CALLED TO, READ BACK BY AND VERIFIED WITH: PHAM PHARMD AT 1000 ON 051620 BY SJW    Klebsiella oxytoca NOT DETECTED NOT DETECTED Final   Klebsiella pneumoniae DETECTED (A) NOT DETECTED Final    Comment: CRITICAL RESULT CALLED TO, READ BACK BY AND VERIFIED WITH: PHAM PHARMD AT 1000 ON 051620 BY SJW    Proteus species NOT DETECTED NOT DETECTED Final   Serratia marcescens NOT DETECTED NOT DETECTED Final   Carbapenem resistance NOT DETECTED NOT DETECTED Final   Haemophilus influenzae NOT DETECTED NOT DETECTED Final   Neisseria  meningitidis NOT DETECTED NOT DETECTED Final   Pseudomonas aeruginosa NOT DETECTED NOT DETECTED Final   Candida albicans NOT DETECTED NOT DETECTED Final   Candida glabrata NOT DETECTED NOT DETECTED Final   Candida krusei NOT DETECTED NOT DETECTED Final   Candida parapsilosis NOT DETECTED NOT DETECTED Final   Candida tropicalis NOT DETECTED NOT DETECTED Final    Comment: Performed at [facility], [address], [place], [place] [address]  Blood culture (routine x 2)     Status: Abnormal   Collection Time: 07/31/18  1:06 PM  Result Value Ref Range Status   Specimen Description   Final    BLOOD LEFT ANTECUBITAL Performed at [facility] [address], [place], [place] [address]    Special Requests   Final    BOTTLES DRAWN AEROBIC AND ANAEROBIC Blood Culture adequate volume   Culture  Setup Time   Final    GRAM NEGATIVE RODS IN BOTH AEROBIC AND ANAEROBIC BOTTLES CRITICAL VALUE NOTED.  VALUE IS CONSISTENT WITH PREVIOUSLY REPORTED AND CALLED VALUE. Performed at [facility], [place] [address] [place], [place] [address]    Culture KLEBSIELLA PNEUMONIAE (A)  Final   Report Status 08/03/2018 FINAL  Final   Organism ID, Bacteria KLEBSIELLA PNEUMONIAE  Final      Susceptibility   Klebsiella pneumoniae - MIC*    AMPICILLIN RESISTANT Resistant     CEFAZOLIN <=4 SENSITIVE Sensitive     CEFEPIME <=1 SENSITIVE Sensitive     CEFTAZIDIME <=1 SENSITIVE Sensitive     CEFTRIAXONE <=1 SENSITIVE Sensitive     CIPROFLOXACIN <=0.25 SENSITIVE Sensitive     GENTAMICIN <=1 SENSITIVE Sensitive     IMIPENEM <=0.25 SENSITIVE Sensitive     TRIMETH/SULFA <=20 SENSITIVE Sensitive     AMPICILLIN/SULBACTAM <=2 SENSITIVE Sensitive     PIP/TAZO <=4 SENSITIVE Sensitive     Extended ESBL NEGATIVE Sensitive     * KLEBSIELLA PNEUMONIAE  SARS Coronavirus 2 (CEPHEID - Performed in [facility] lab), Hosp Order     Status: None   Collection Time: 07/31/18  5:38 PM  Result Value  Ref Range Status   SARS Coronavirus 2 NEGATIVE  NEGATIVE Final    Comment: (NOTE) If result is NEGATIVE SARS-CoV-2 target nucleic acids are NOT DETECTED. The SARS-CoV-2 RNA is generally detectable in upper and lower  respiratory specimens during the acute phase of infection. The lowest  concentration of SARS-CoV-2 viral copies this assay can detect is 250  copies / mL. A negative result does not preclude SARS-CoV-2 infection  and should not be used as the sole basis for treatment or other  patient management decisions.  A negative result may occur with  improper specimen collection / handling, submission of specimen other  than nasopharyngeal swab, presence of viral mutation(s) within the  areas targeted by this assay, and inadequate number of viral copies  (<250 copies / mL). A negative result must be combined with clinical  observations, patient history, and epidemiological information. If result is POSITIVE SARS-CoV-2 target nucleic acids are DETECTED. The SARS-CoV-2 RNA is generally detectable in upper and lower  respiratory specimens dur ing the acute phase of infection.  Positive  results are indicative of active infection with SARS-CoV-2.  Clinical  correlation with patient history and other diagnostic information is  necessary to determine patient infection status.  Positive results do  not rule out bacterial infection or co-infection with other viruses. If result is PRESUMPTIVE POSTIVE SARS-CoV-2 nucleic acids MAY BE PRESENT.   A presumptive positive result was obtained on the submitted specimen  and confirmed on repeat testing.  While 2019 novel coronavirus  (SARS-CoV-2) nucleic acids may be present in the submitted sample  additional confirmatory testing may be necessary for epidemiological  and / or clinical management purposes  to differentiate between  SARS-CoV-2 and other Sarbecovirus currently known to infect humans.  If clinically indicated additional testing with an  alternate test  methodology [phone]) is advised. The SARS-CoV-2 RNA is generally  detectable in upper and lower respiratory sp ecimens during the acute  phase of infection. The expected result is Negative. Fact Sheet for Patients:  StrictlyIdeas.no Fact Sheet for Healthcare Providers: BankingDealers.co.za This test is not yet approved or cleared by the [place] FDA and has been authorized for detection and/or diagnosis of SARS-CoV-2 by FDA under an Emergency Use Authorization (EUA).  This EUA will remain in effect (meaning this test can be used) for the duration of the COVID-19 declaration under Section 564(b)(1) of the Act, 21 U.S.C. section 360bbb-3(b)(1), unless the authorization is terminated or revoked sooner. Performed at [facility], [place] [address] [place], [place] [address]   Culture, Urine     Status: Abnormal   Collection Time: 07/31/18  5:38 PM  Result Value Ref Range Status   Specimen Description   Final    URINE, RANDOM Performed at [facility] [address], [place], [place] [address]    Special Requests   Final    NONE Performed at [facility], [place] [address], [place], [place] [address]    Culture (A)  Final    >=100,000 COLONIES/mL KLEBSIELLA PNEUMONIAE >=100,000 COLONIES/mL ESCHERICHIA COLI    Report Status 08/03/2018 FINAL  Final   Organism ID, Bacteria KLEBSIELLA PNEUMONIAE (A)  Final   Organism ID, Bacteria ESCHERICHIA COLI (A)  Final      Susceptibility   Escherichia coli - MIC*    AMPICILLIN 8 SENSITIVE Sensitive     CEFAZOLIN <=4 SENSITIVE Sensitive     CEFTRIAXONE <=1 SENSITIVE Sensitive     CIPROFLOXACIN <=0.25 SENSITIVE Sensitive     GENTAMICIN <=1 SENSITIVE Sensitive  IMIPENEM <=0.25 SENSITIVE Sensitive     NITROFURANTOIN 64 INTERMEDIATE Intermediate     TRIMETH/SULFA >=320 RESISTANT Resistant     AMPICILLIN/SULBACTAM <=2  SENSITIVE Sensitive     PIP/TAZO <=4 SENSITIVE Sensitive     Extended ESBL NEGATIVE Sensitive     * >=100,000 COLONIES/mL ESCHERICHIA COLI   Klebsiella pneumoniae - MIC*    AMPICILLIN RESISTANT Resistant     CEFAZOLIN <=4 SENSITIVE Sensitive     CEFTRIAXONE <=1 SENSITIVE Sensitive     CIPROFLOXACIN <=0.25 SENSITIVE Sensitive     GENTAMICIN <=1 SENSITIVE Sensitive     IMIPENEM <=0.25 SENSITIVE Sensitive     NITROFURANTOIN 128 RESISTANT Resistant     TRIMETH/SULFA <=20 SENSITIVE Sensitive     AMPICILLIN/SULBACTAM <=2 SENSITIVE Sensitive     PIP/TAZO <=4 SENSITIVE Sensitive     Extended ESBL NEGATIVE Sensitive     * >=100,000 COLONIES/mL KLEBSIELLA PNEUMONIAE     Liver Function Tests: Recent Labs  Lab 07/31/18 1256 08/01/18 0521  AST 31 25  ALT 19 15  ALKPHOS 91 82  BILITOT 0.3 0.3  PROT 7.8 6.6  ALBUMIN 3.0* 2.5*   Recent Labs  Lab 07/31/18 1256  LIPASE 38   No results for input(s): AMMONIA in the last 168 hours.  Cardiac Enzymes: Recent Labs  Lab 07/31/18 1301  TROPONINI <0.03   BNP (last 3 results) No results for input(s): BNP in the last 8760 hours.  ProBNP (last 3 results) No results for input(s): PROBNP in the last 8760 hours.    Studies: No results found.  Scheduled Meds: . carvedilol  6.25 mg Oral BID WC  . enoxaparin (LOVENOX) injection  30 mg Subcutaneous Q24H  . loratadine  10 mg Oral Daily  . nicotine  21 mg Transdermal Daily    Admission status: Inpatient: Based on patients clinical presentation and evaluation ofabove clinical data, I have made determination that patient meets Inpatient criteria at this time.  Time spent: 20 min  [facility] Pager [phone]. If 7PM-7AM, please contact night-coverage at [email or web address], Office  [phone]  password [email or web address]  08/03/2018, 4:38 PM  LOS: 3 days

## 2018-08-04 DIAGNOSIS — I10 Essential (primary) hypertension: Secondary | ICD-10-CM

## 2018-08-04 LAB — COMPREHENSIVE METABOLIC PANEL
ALT: 16 U/L (ref 0–44)
AST: 17 U/L (ref 15–41)
Albumin: 2.7 g/dL — ABNORMAL LOW (ref 3.5–5.0)
Alkaline Phosphatase: 81 U/L (ref 38–126)
Anion gap: 10 (ref 5–15)
BUN: 35 mg/dL — ABNORMAL HIGH (ref 6–20)
CO2: 22 mmol/L (ref 22–32)
Calcium: 9.2 mg/dL (ref 8.9–10.3)
Chloride: 105 mmol/L (ref 98–111)
Creatinine, Ser: 2.18 mg/dL — ABNORMAL HIGH (ref 0.44–1.00)
GFR calc Af Amer: 28 mL/min — ABNORMAL LOW (ref >60)
GFR calc non Af Amer: 24 mL/min — ABNORMAL LOW (ref >60)
Glucose, Bld: 87 mg/dL (ref 70–99)
Potassium: 5 mmol/L (ref 3.5–5.1)
Sodium: 137 mmol/L (ref 135–145)
Total Bilirubin: 0.2 mg/dL — ABNORMAL LOW (ref 0.3–1.2)
Total Protein: 7.2 g/dL (ref 6.5–8.1)

## 2018-08-04 LAB — CBC
HCT: 28.7 % — ABNORMAL LOW (ref 36.0–46.0)
Hemoglobin: 9.1 g/dL — ABNORMAL LOW (ref 12.0–15.0)
MCH: 29.3 pg (ref 26.0–34.0)
MCHC: 31.7 g/dL (ref 30.0–36.0)
MCV: 92.3 fL (ref 80.0–100.0)
Platelets: 267 10*3/uL (ref 150–400)
RBC: 3.11 MIL/uL — ABNORMAL LOW (ref 3.87–5.11)
RDW: 17 % — ABNORMAL HIGH (ref 11.5–15.5)
WBC: 9.7 10*3/uL (ref 4.0–10.5)
nRBC: 0 % (ref 0.0–0.2)

## 2018-08-04 MED ORDER — AMLODIPINE BESYLATE 10 MG PO TABS: 10.0000 mg | ORAL_TABLET | Freq: Every day | ORAL | Status: DC

## 2018-08-04 MED ORDER — OXYCODONE-ACETAMINOPHEN 5-325 MG PO TABS: 1.0000 | ORAL_TABLET | Freq: Four times a day (QID) | ORAL | 0 refills | Status: DC | PRN

## 2018-08-04 MED ORDER — CEPHALEXIN 500 MG PO CAPS: 500.0000 mg | ORAL_CAPSULE | Freq: Two times a day (BID) | ORAL | 0 refills | Status: AC

## 2018-08-04 MED ORDER — AMLODIPINE BESYLATE 10 MG PO TABS
10.0000 mg | ORAL_TABLET | Freq: Every day | ORAL | Status: DC
  Administered 2018-08-04: 10 mg via ORAL
  Filled 2018-08-04: qty 1

## 2018-08-04 MED ORDER — AMLODIPINE BESYLATE 10 MG PO TABS: 10.0000 mg | ORAL_TABLET | Freq: Every day | ORAL | 2 refills | Status: DC

## 2018-08-04 MED ORDER — FERROUS SULFATE 325 (65 FE) MG PO TABS: 325.0000 mg | ORAL_TABLET | Freq: Two times a day (BID) | ORAL | 3 refills | Status: DC

## 2018-08-04 MED ORDER — CARVEDILOL 6.25 MG PO TABS: ORAL_TABLET | ORAL | 2 refills | Status: DC

## 2018-08-04 NOTE — Discharge Summary (Signed)
Physician Discharge Summary  [name] ZOX:[id number] DOB: 11-11-1960 DOA: 07/31/2018  PCP: Ladell Pier, MD  Admit date: 07/31/2018 Discharge date: 08/04/2018  Time spent: 45* minutes  Recommendations for Outpatient Follow-up:  1. Follow-up urology in 2 weeks 2. Continue Keflex 500 twice daily for 10 more days. 3. Check BMP in 1 week   Discharge Diagnoses:  Active Problems:   AKI (acute kidney injury) [facility])   Discharge Condition: Stable  Diet recommendation: Regular diet  Filed Weights   07/31/18 1242  Weight: 64.4 kg    History of present illness:  58 year old female with a history of alcohol abuse, asthma, depression, hypertension, CKD stage III came to hospital with left-sided flank pain for past 3 days.  CT of the abdomen pelvis showed left hydronephrosis with 0.6 cm stone just below renal pelvis.  Urology was consulted. Patient seen and examined, denies flank pain.  Status post cystoscopy and double-J ureteral stent placement yesterday.  Hemoglobin on 08/02/2018 was 6.7.  1 unit PRBC was given  Hospital Course:   1. Gram-negative rod bacteremia-blood cultures 2/4 growing gram-negative rods. Continue  ceftriaxone  2 g IV every 24 hours.  Blood cultures growing E. coli and Klebsiella pneumoniae, sensitive to ceftriaxone, cefazolin. Will discharge on Keflex 500 p.o. twice daily for 10 more days.  2. Left ureteral stone-status post cystoscopy and ureteral stent placement as per urology.  Urology following.  Continue pain management.  Urology to follow in 2 weeks for definitive stone treatment.  3. Anemia of chronic disease-likely due to underlying CKD stage III.  Hemoglobin was 6.7 yesterday and 1 unit PRBC was ordered.  Today hemoglobin is improved to 8.2.  Anemia panel showed severe iron deficiency anemia with 7% saturation of iron.Follow CBC in a.m.She will need GI consultation as outpatient.  Will start ferrous sulfate 325 mg tablet twice a  day.  4. Hyperkalemia-potassium was 5.5 on 08/02/2018, it has improved to 5.0 today.  5. UTI-patient is on IV ceftriaxone.  Urine culture growing E. coli and Klebsiella pneumonia, sensitive to ceftriaxone, cefazolin.  Will discharge on Keflex as above.  6. Hypertension-blood pressure is mildly elevated, continue Coreg.  Will also add amlodipine 10 mg p.o. daily  7. Acute kidney injury on CKD stage III-secondary postobstructive nephropathy due to renal stone.  Baseline creatinine around 1.5.  Creatinine is slowly improving, today creatinine is 2.18.  She will need repeat BMP in 1 week.  8. History of asthma-stable, not in exacerbation. SARS-CoV-2 test is negative.    Procedures:  Retrograde pyelogram, ureteral stent placement in left kidney  Consultations:  Urology  Vitals:   08/03/18 2038 08/04/18 0547  BP: (!) 164/87 (!) 191/93  Pulse: 69 64  Resp: 14 12  Temp: 98.2 F (36.8 C) 98.2 F (36.8 C)  SpO2: 99% 100%   Repeat BP after Amlodipine  174/91  General-appears in no acute distress Heart-S1-S2, regular, no murmur auscultated Lungs-clear to auscultation bilaterally, no wheezing or crackles auscultated Abdomen-soft, nontender, no organomegaly Extremities-no edema in the lower extremities Neuro-alert, oriented x3, no focal deficit noted  Discharge Instructions   Discharge Instructions    Diet - low sodium heart healthy   Complete by:  As directed    Increase activity slowly   Complete by:  As directed      Allergies as of 08/04/2018      Reactions   Lisinopril Other (See Comments)   hyperkalemia      Medication List    STOP taking these  medications   ibuprofen 800 MG tablet Commonly known as:  ADVIL   omeprazole 20 MG capsule Commonly known as:  PRILOSEC   predniSONE 20 MG tablet Commonly known as:  DELTASONE   traMADol 50 MG tablet Commonly known as:  ULTRAM     TAKE these medications   albuterol 108 (90 Base) MCG/ACT inhaler Commonly  known as:  Ventolin HFA INHALE 2 PUFFS INTO THE LUNGS EVERY 6 HOURS AS NEEDED FOR WHEEZING OR SHORTNESS OF BREATH.   amLODipine 10 MG tablet Commonly known as:  NORVASC Take 1 tablet (10 mg total) by mouth daily. Start taking on:  Aug 05, 2018   atorvastatin 10 MG tablet Commonly known as:  LIPITOR Take 0.5 tablets (5 mg total) by mouth daily.   carvedilol 6.25 MG tablet Commonly known as:  COREG TAKE 1 TABLET BY MOUTH 2 TIMES DAILY WITH A MEAL   cephALEXin 500 MG capsule Commonly known as:  KEFLEX Take 1 capsule (500 mg total) by mouth 2 (two) times daily for 10 days.   cetirizine 10 MG tablet Commonly known as:  ZYRTEC TAKE 1 TABLET BY MOUTH DAILY.   cyanocobalamin 1000 MCG tablet Take 1 tablet (1,000 mcg total) by mouth daily.   ferrous sulfate 325 (65 FE) MG tablet Take 1 tablet (325 mg total) by mouth 2 (two) times daily with a meal.   gabapentin 300 MG capsule Commonly known as:  NEURONTIN Take 1 capsule (300 mg total) by mouth 2 (two) times daily.   oxyCODONE-acetaminophen 5-325 MG tablet Commonly known as:  Percocet Take 1-2 tablets by mouth every 6 (six) hours as needed for severe pain.   pantoprazole 20 MG tablet Commonly known as:  PROTONIX Take 1 tablet (20 mg total) by mouth daily.   traZODone 50 MG tablet Commonly known as:  DESYREL Take 2 tablets (100 mg total) by mouth at bedtime as needed. for sleep      Allergies  Allergen Reactions  . Lisinopril Other (See Comments)    hyperkalemia   Follow-up Information    Marton Redwood III, MD. Schedule an appointment as soon as possible for a visit in 2 week(s).   Specialty:  Urology Contact information: [place] [id number] [phone]            The results of significant diagnostics from this hospitalization (including imaging, microbiology, ancillary and laboratory) are listed below for reference.    Significant Diagnostic Studies: Ct Abdomen Pelvis Wo  Contrast  Result Date: 07/31/2018 CLINICAL DATA:  Left side abdominal pain for 3 weeks. EXAM: CT ABDOMEN AND PELVIS WITHOUT CONTRAST TECHNIQUE: Multidetector CT imaging of the abdomen and pelvis was performed following the standard protocol without IV contrast. COMPARISON:  None. FINDINGS: Lower chest: Dependent atelectasis is identified. There is no pleural or pericardial effusion. Hepatobiliary: Mild fatty infiltration of the liver without focal lesion. Gallbladder and biliary tree are unremarkable. Pancreas: Unremarkable. No pancreatic ductal dilatation or surrounding inflammatory changes. Spleen: Normal in size without focal abnormality. Adrenals/Urinary Tract: The patient has moderate left hydronephrosis due to a 0.6 cm stone just below the renal pelvis. 2 nonobstructing stones are seen in the left kidney measuring 0.5 cm in the lower pole and 1.1 cm in the upper pole. There is extensive stranding about the left kidney and proximal ureter. The right kidney is ptotic with an abnormal axis of orientation. The pelvis is directed anteriorly. Duplicated right renal collecting system is noted. Two nonobstructing stones are seen in  the right kidney. The larger measures 0.5 cm. Urinary bladder is unremarkable. The adrenal glands appear normal. Stomach/Bowel: Stomach is within normal limits. Appendix appears normal. No evidence of bowel wall thickening, distention, or inflammatory changes. Vascular/Lymphatic: Aortic atherosclerosis. No enlarged abdominal or pelvic lymph nodes. Reproductive: Uterus and bilateral adnexa are unremarkable. Other: The none. Musculoskeletal: No acute or focal abnormality. IMPRESSION: Moderate left hydronephrosis due to a 0.6 cm stone just below the renal pelvis. The patient has 2 additional nonobstructing stone in the left kidney. 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 Two nonobstructing stones are present in the  right kidney. Fatty infiltration liver. Atherosclerosis. Electronically Signed   By: Inge Rise M.D.   On: 07/31/2018 16:21   Dg Chest Port 1 View  Result Date: 07/31/2018 CLINICAL DATA:  Chest discomfort EXAM: PORTABLE CHEST 1 VIEW COMPARISON:  10/29/2017 FINDINGS: The heart size and mediastinal contours are within normal limits. Both lungs are clear. The visualized skeletal structures are unremarkable. IMPRESSION: No active disease. Electronically Signed   By: Inez Catalina M.D.   On: 07/31/2018 14:29   Dg C-arm 1-60 Min-no Report  Result Date: 08/01/2018 Fluoroscopy was utilized by the requesting physician.  No radiographic interpretation.    Microbiology: Recent Results (from the past 240 hour(s))  Blood culture (routine x 2)     Status: Abnormal   Collection Time: 07/31/18  1:01 PM  Result Value Ref Range Status   Specimen Description   Final    BLOOD RIGHT ANTECUBITAL Performed at [facility] [address] [place], [place] [address]    Special Requests   Final    BOTTLES DRAWN AEROBIC AND ANAEROBIC Blood Culture results may not be optimal due to an excessive volume of blood received in culture bottles   Culture  Setup Time   Final    GRAM NEGATIVE RODS ANAEROBIC BOTTLE ONLY CRITICAL RESULT CALLED TO, READ BACK BY AND VERIFIED WITH: PHAM PHARMD AT 1000 ON [id number] BY SJW    Culture (A)  Final    ESCHERICHIA COLI KLEBSIELLA PNEUMONIAE SUSCEPTIBILITIES PERFORMED ON PREVIOUS CULTURE WITHIN THE LAST 5 DAYS. Performed at [facility], [place] [address] [place], [place] [address]    Report Status 08/03/2018 FINAL  Final   Organism ID, Bacteria ESCHERICHIA COLI  Final      Susceptibility   Escherichia coli - MIC*    AMPICILLIN 16 INTERMEDIATE Intermediate     CEFAZOLIN <=4 SENSITIVE Sensitive     CEFEPIME <=1 SENSITIVE Sensitive     CEFTAZIDIME <=1 SENSITIVE Sensitive     CEFTRIAXONE <=1 SENSITIVE Sensitive     CIPROFLOXACIN <=0.25 SENSITIVE  Sensitive     GENTAMICIN <=1 SENSITIVE Sensitive     IMIPENEM <=0.25 SENSITIVE Sensitive     TRIMETH/SULFA >=320 RESISTANT Resistant     AMPICILLIN/SULBACTAM 8 SENSITIVE Sensitive     PIP/TAZO <=4 SENSITIVE Sensitive     Extended ESBL NEGATIVE Sensitive     * ESCHERICHIA COLI  Blood Culture ID Panel (Reflexed)     Status: Abnormal   Collection Time: 07/31/18  1:01 PM  Result Value Ref Range Status   Enterococcus species NOT DETECTED NOT DETECTED Final   Listeria monocytogenes NOT DETECTED NOT DETECTED Final   Staphylococcus species NOT DETECTED NOT DETECTED Final   Staphylococcus aureus (BCID) NOT DETECTED NOT DETECTED Final   Streptococcus species NOT DETECTED NOT DETECTED Final   Streptococcus agalactiae NOT  DETECTED NOT DETECTED Final   Streptococcus pneumoniae NOT DETECTED NOT DETECTED Final   Streptococcus pyogenes NOT DETECTED NOT DETECTED Final   Acinetobacter baumannii NOT DETECTED NOT DETECTED Final   Enterobacteriaceae species DETECTED (A) NOT DETECTED Final    Comment: CRITICAL RESULT CALLED TO, READ BACK BY AND VERIFIED WITH: PHAM PHARMD AT 1000 ON [id number] BY SJW    Enterobacter cloacae complex NOT DETECTED NOT DETECTED Final   Escherichia coli DETECTED (A) NOT DETECTED Final    Comment: CRITICAL RESULT CALLED TO, READ BACK BY AND VERIFIED WITH: PHAM PHARMD AT 1000 ON [id number] BY SJW    Klebsiella oxytoca NOT DETECTED NOT DETECTED Final   Klebsiella pneumoniae DETECTED (A) NOT DETECTED Final    Comment: CRITICAL RESULT CALLED TO, READ BACK BY AND VERIFIED WITH: PHAM PHARMD AT 1000 ON 051620 BY SJW    Proteus species NOT DETECTED NOT DETECTED Final   Serratia marcescens NOT DETECTED NOT DETECTED Final   Carbapenem resistance NOT DETECTED NOT DETECTED Final   Haemophilus influenzae NOT DETECTED NOT DETECTED Final   Neisseria meningitidis NOT DETECTED NOT DETECTED Final   Pseudomonas aeruginosa NOT DETECTED NOT DETECTED Final   Candida albicans NOT DETECTED NOT DETECTED  Final   Candida glabrata NOT DETECTED NOT DETECTED Final   Candida krusei NOT DETECTED NOT DETECTED Final   Candida parapsilosis NOT DETECTED NOT DETECTED Final   Candida tropicalis NOT DETECTED NOT DETECTED Final    Comment: Performed at [facility], [address], [place], [place] [address]  Blood culture (routine x 2)     Status: Abnormal   Collection Time: 07/31/18  1:06 PM  Result Value Ref Range Status   Specimen Description   Final    BLOOD LEFT ANTECUBITAL Performed at [facility] [address], [place], [place] [address]    Special Requests   Final    BOTTLES DRAWN AEROBIC AND ANAEROBIC Blood Culture adequate volume   Culture  Setup Time   Final    GRAM NEGATIVE RODS IN BOTH AEROBIC AND ANAEROBIC BOTTLES CRITICAL VALUE NOTED.  VALUE IS CONSISTENT WITH PREVIOUSLY REPORTED AND CALLED VALUE. Performed at [facility], [place] [address] [place], [place] [address]    Culture KLEBSIELLA PNEUMONIAE (A)  Final   Report Status 08/03/2018 FINAL  Final   Organism ID, Bacteria KLEBSIELLA PNEUMONIAE  Final      Susceptibility   Klebsiella pneumoniae - MIC*    AMPICILLIN RESISTANT Resistant     CEFAZOLIN <=4 SENSITIVE Sensitive     CEFEPIME <=1 SENSITIVE Sensitive     CEFTAZIDIME <=1 SENSITIVE Sensitive     CEFTRIAXONE <=1 SENSITIVE Sensitive     CIPROFLOXACIN <=0.25 SENSITIVE Sensitive     GENTAMICIN <=1 SENSITIVE Sensitive     IMIPENEM <=0.25 SENSITIVE Sensitive     TRIMETH/SULFA <=20 SENSITIVE Sensitive     AMPICILLIN/SULBACTAM <=2 SENSITIVE Sensitive     PIP/TAZO <=4 SENSITIVE Sensitive     Extended ESBL NEGATIVE Sensitive     * KLEBSIELLA PNEUMONIAE  SARS Coronavirus 2 (CEPHEID - Performed in [facility] hospital lab), Hosp Order     Status: None   Collection Time: 07/31/18  5:38 PM  Result Value Ref Range Status   SARS Coronavirus 2 NEGATIVE NEGATIVE Final    Comment: (NOTE) If result is NEGATIVE SARS-CoV-2 target nucleic acids are NOT  DETECTED. The SARS-CoV-2 RNA is generally detectable in upper and lower  respiratory specimens during the acute phase of infection. The  lowest  concentration of SARS-CoV-2 viral copies this assay can detect is 250  copies / mL. A negative result does not preclude SARS-CoV-2 infection  and should not be used as the sole basis for treatment or other  patient management decisions.  A negative result may occur with  improper specimen collection / handling, submission of specimen other  than nasopharyngeal swab, presence of viral mutation(s) within the  areas targeted by this assay, and inadequate number of viral copies  (<250 copies / mL). A negative result must be combined with clinical  observations, patient history, and epidemiological information. If result is POSITIVE SARS-CoV-2 target nucleic acids are DETECTED. The SARS-CoV-2 RNA is generally detectable in upper and lower  respiratory specimens dur ing the acute phase of infection.  Positive  results are indicative of active infection with SARS-CoV-2.  Clinical  correlation with patient history and other diagnostic information is  necessary to determine patient infection status.  Positive results do  not rule out bacterial infection or co-infection with other viruses. If result is PRESUMPTIVE POSTIVE SARS-CoV-2 nucleic acids MAY BE PRESENT.   A presumptive positive result was obtained on the submitted specimen  and confirmed on repeat testing.  While 2019 novel coronavirus  (SARS-CoV-2) nucleic acids may be present in the submitted sample  additional confirmatory testing may be necessary for epidemiological  and / or clinical management purposes  to differentiate between  SARS-CoV-2 and other Sarbecovirus currently known to infect humans.  If clinically indicated additional testing with an alternate test  methodology [phone]) is advised. The SARS-CoV-2 RNA is generally  detectable in upper and lower respiratory sp ecimens during  the acute  phase of infection. The expected result is Negative. Fact Sheet for Patients:  StrictlyIdeas.no Fact Sheet for Healthcare Providers: BankingDealers.co.za This test is not yet approved or cleared by the [place] FDA and has been authorized for detection and/or diagnosis of SARS-CoV-2 by FDA under an Emergency Use Authorization (EUA).  This EUA will remain in effect (meaning this test can be used) for the duration of the COVID-19 declaration under Section 564(b)(1) of the Act, 21 U.S.C. section 360bbb-3(b)(1), unless the authorization is terminated or revoked sooner. Performed at [facility], [place] [address] [place], [place] [address]   Culture, Urine     Status: Abnormal   Collection Time: 07/31/18  5:38 PM  Result Value Ref Range Status   Specimen Description   Final    URINE, RANDOM Performed at [facility] [address], [place], [place] [address]    Special Requests   Final    NONE Performed at [facility], [place] [address] [place], [place] [address]    Culture (A)  Final    >=100,000 COLONIES/mL KLEBSIELLA PNEUMONIAE >=100,000 COLONIES/mL ESCHERICHIA COLI    Report Status 08/03/2018 FINAL  Final   Organism ID, Bacteria KLEBSIELLA PNEUMONIAE (A)  Final   Organism ID, Bacteria ESCHERICHIA COLI (A)  Final      Susceptibility   Escherichia coli - MIC*    AMPICILLIN 8 SENSITIVE Sensitive     CEFAZOLIN <=4 SENSITIVE Sensitive     CEFTRIAXONE <=1 SENSITIVE Sensitive     CIPROFLOXACIN <=0.25 SENSITIVE Sensitive     GENTAMICIN <=1 SENSITIVE Sensitive     IMIPENEM <=0.25 SENSITIVE Sensitive     NITROFURANTOIN 64 INTERMEDIATE Intermediate     TRIMETH/SULFA >=320 RESISTANT Resistant     AMPICILLIN/SULBACTAM <=2 SENSITIVE Sensitive     PIP/TAZO <=4 SENSITIVE Sensitive  Extended ESBL NEGATIVE Sensitive     * >=100,000 COLONIES/mL ESCHERICHIA COLI    Klebsiella pneumoniae - MIC*    AMPICILLIN RESISTANT Resistant     CEFAZOLIN <=4 SENSITIVE Sensitive     CEFTRIAXONE <=1 SENSITIVE Sensitive     CIPROFLOXACIN <=0.25 SENSITIVE Sensitive     GENTAMICIN <=1 SENSITIVE Sensitive     IMIPENEM <=0.25 SENSITIVE Sensitive     NITROFURANTOIN 128 RESISTANT Resistant     TRIMETH/SULFA <=20 SENSITIVE Sensitive     AMPICILLIN/SULBACTAM <=2 SENSITIVE Sensitive     PIP/TAZO <=4 SENSITIVE Sensitive     Extended ESBL NEGATIVE Sensitive     * >=100,000 COLONIES/mL KLEBSIELLA PNEUMONIAE     Labs: Basic Metabolic Panel: Recent Labs  Lab 07/31/18 1256 08/01/18 0521 08/02/18 0356 08/03/18 0320 08/04/18 0534  NA 131* 134* 130* 132* 137  K 4.3 4.5 5.5* 4.4 5.0  CL 102 108 105 105 105  CO2 15* 15* 17* 18* 22  GLUCOSE 83 103* 113* 98 87  BUN 30* 30* 40* 40* 35*  CREATININE 2.29* 2.69* 2.47* 2.14* 2.18*  CALCIUM 8.5* 8.2* 8.1* 8.5* 9.2   Liver Function Tests: Recent Labs  Lab 07/31/18 1256 08/01/18 0521 08/04/18 0534  AST 31 25 17   ALT 19 15 16   ALKPHOS 91 82 81  BILITOT 0.3 0.3 0.2*  PROT 7.8 6.6 7.2  ALBUMIN 3.0* 2.5* 2.7*   Recent Labs  Lab 07/31/18 1256  LIPASE 38   No results for input(s): AMMONIA in the last 168 hours. CBC: Recent Labs  Lab 07/31/18 1256 08/01/18 0521 08/02/18 0356 08/03/18 0320 08/04/18 0534  WBC 14.8* 21.3* 24.7* 20.6* 9.7  HGB 8.1* 7.1* 6.7* 8.5* 9.1*  HCT 25.2* 23.7* 23.1* 27.3* 28.7*  MCV 90.0 94.0 95.5 91.3 92.3  PLT 375 261 231 272 267   Cardiac Enzymes: Recent Labs  Lab 07/31/18 1301  TROPONINI <0.03   BNP:  CBG: Recent Labs  Lab 07/31/18 2010 07/31/18 2353 08/01/18 0418 08/01/18 0731 08/01/18 1625  GLUCAP 112* 97 107* 98 158*       Signed:  Oswald Hillock MD.  [facility] 08/04/2018, 12:44 PM

## 2018-08-04 NOTE — Progress Notes (Signed)
Explained to pt [name] program and she understand that this is once in one year use, there is a co-pay for each medication of 3-6 dollars.

## 2018-08-04 NOTE — Plan of Care (Signed)

## 2018-08-04 NOTE — Progress Notes (Signed)
CSW spoke with pt re: MATCH plan and provided information about goodrx resource. Pt states she is asking friend/ex-husband if he could help with copays befire determining if MATCH needed/appropriate.   Pt left prior to confirming with CSW. Has scripts and has CSW contact information to follow up if resources needed.   Sharren Bridge, MSW, LCSW Transitions of Care 08/04/2018 [phone] coverage for [phone]

## 2018-08-05 ENCOUNTER — Inpatient Hospital Stay: Payer: Self-pay | Admitting: Family Medicine

## 2018-11-01 ENCOUNTER — Other Ambulatory Visit: Payer: Self-pay

## 2018-11-01 ENCOUNTER — Emergency Department (HOSPITAL_COMMUNITY): Payer: Self-pay

## 2018-11-01 ENCOUNTER — Emergency Department (HOSPITAL_COMMUNITY)
Admission: EM | Admit: 2018-11-01 | Discharge: 2018-11-01 | Disposition: A | Discharge status: Home / Self Care | Payer: Self-pay | Attending: Emergency Medicine | Admitting: Emergency Medicine

## 2018-11-01 DIAGNOSIS — N183 Chronic kidney disease, stage 3 (moderate): Secondary | ICD-10-CM | POA: Insufficient documentation

## 2018-11-01 DIAGNOSIS — I129 Hypertensive chronic kidney disease with stage 1 through stage 4 chronic kidney disease, or unspecified chronic kidney disease: Secondary | ICD-10-CM | POA: Insufficient documentation

## 2018-11-01 DIAGNOSIS — F17210 Nicotine dependence, cigarettes, uncomplicated: Secondary | ICD-10-CM | POA: Insufficient documentation

## 2018-11-01 DIAGNOSIS — E1122 Type 2 diabetes mellitus with diabetic chronic kidney disease: Secondary | ICD-10-CM | POA: Insufficient documentation

## 2018-11-01 DIAGNOSIS — S20219A Contusion of unspecified front wall of thorax, initial encounter: Secondary | ICD-10-CM | POA: Insufficient documentation

## 2018-11-01 DIAGNOSIS — Y9389 Activity, other specified: Secondary | ICD-10-CM | POA: Insufficient documentation

## 2018-11-01 DIAGNOSIS — Y999 Unspecified external cause status: Secondary | ICD-10-CM | POA: Insufficient documentation

## 2018-11-01 DIAGNOSIS — Y92511 Restaurant or cafe as the place of occurrence of the external cause: Secondary | ICD-10-CM | POA: Insufficient documentation

## 2018-11-01 DIAGNOSIS — S022XXA Fracture of nasal bones, initial encounter for closed fracture: Secondary | ICD-10-CM | POA: Insufficient documentation

## 2018-11-01 DIAGNOSIS — S0083XA Contusion of other part of head, initial encounter: Secondary | ICD-10-CM | POA: Insufficient documentation

## 2018-11-01 DIAGNOSIS — J45909 Unspecified asthma, uncomplicated: Secondary | ICD-10-CM | POA: Insufficient documentation

## 2018-11-01 LAB — TROPONIN I (HIGH SENSITIVITY): Troponin I (High Sensitivity): 6 ng/L (ref <18)

## 2018-11-01 MED ORDER — ACETAMINOPHEN 325 MG PO TABS
650.0000 mg | ORAL_TABLET | Freq: Four times a day (QID) | ORAL | Status: DC | PRN
  Administered 2018-11-01: 650 mg via ORAL
  Filled 2018-11-01: qty 2

## 2018-11-01 NOTE — Progress Notes (Signed)
Received [name] from the main ED at 0630 hrs. She went immediately to sleep. Her belongings were placed in the locker.

## 2018-11-01 NOTE — Discharge Instructions (Addendum)
Use ice packs on the painful areas.  You can take acetaminophen for pain as needed.  Your CT scan shows you do have a fractured bone in your nose, however they could not tell if that was new or something old.  If you feel like you are having difficulty breathing from your nose or if your nose looks crooked once the swelling is gone, call Dr. Iona Beard office to have a follow-up appointment.  He is a ears, nose, and throat specialist.  Return to the emergency department for any problems listed on the head injury sheet.

## 2018-11-01 NOTE — ED Notes (Signed)
MD at bedside. 

## 2018-11-01 NOTE — ED Notes (Signed)
Patient transported to CT 
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
## 2018-11-01 NOTE — ED Provider Notes (Addendum)
Notified pt is coughing.  [facility] xray reviewed.  No acute findings noted in lungs.  No hypoxia.   Pt is a smoker.  Likely contributing factor   Dorie Rank, MD 11/01/18 0957  Cleared by social work for discharge.   Dorie Rank, MD 11/01/18 1049
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
## 2018-11-01 NOTE — ED Notes (Signed)
EKG given to EDP, Knapp,I. For review. 

## 2018-12-31 ENCOUNTER — Other Ambulatory Visit: Payer: Self-pay

## 2018-12-31 ENCOUNTER — Inpatient Hospital Stay (HOSPITAL_COMMUNITY): Payer: Medicare Other

## 2018-12-31 ENCOUNTER — Inpatient Hospital Stay (HOSPITAL_COMMUNITY)
Admission: EM | Admit: 2018-12-31 | Discharge: 2019-01-02 | DRG: 378 | Disposition: A | Discharge status: Home / Self Care | Payer: Medicare Other | Attending: Internal Medicine | Admitting: Internal Medicine

## 2018-12-31 ENCOUNTER — Emergency Department (HOSPITAL_COMMUNITY): Payer: Medicare Other

## 2018-12-31 DIAGNOSIS — Z23 Encounter for immunization: Secondary | ICD-10-CM

## 2018-12-31 DIAGNOSIS — J301 Allergic rhinitis due to pollen: Secondary | ICD-10-CM

## 2018-12-31 DIAGNOSIS — D649 Anemia, unspecified: Secondary | ICD-10-CM

## 2018-12-31 DIAGNOSIS — K2990 Gastroduodenitis, unspecified, without bleeding: Secondary | ICD-10-CM | POA: Diagnosis present

## 2018-12-31 DIAGNOSIS — K269 Duodenal ulcer, unspecified as acute or chronic, without hemorrhage or perforation: Secondary | ICD-10-CM | POA: Diagnosis not present

## 2018-12-31 DIAGNOSIS — K921 Melena: Secondary | ICD-10-CM | POA: Diagnosis present

## 2018-12-31 DIAGNOSIS — T39395A Adverse effect of other nonsteroidal anti-inflammatory drugs [NSAID], initial encounter: Secondary | ICD-10-CM | POA: Diagnosis present

## 2018-12-31 DIAGNOSIS — N1832 Chronic kidney disease, stage 3b: Secondary | ICD-10-CM | POA: Diagnosis present

## 2018-12-31 DIAGNOSIS — Z888 Allergy status to other drugs, medicaments and biological substances status: Secondary | ICD-10-CM | POA: Diagnosis not present

## 2018-12-31 DIAGNOSIS — K922 Gastrointestinal hemorrhage, unspecified: Secondary | ICD-10-CM | POA: Diagnosis present

## 2018-12-31 DIAGNOSIS — D631 Anemia in chronic kidney disease: Secondary | ICD-10-CM | POA: Diagnosis present

## 2018-12-31 DIAGNOSIS — Z86010 Personal history of colonic polyps: Secondary | ICD-10-CM

## 2018-12-31 DIAGNOSIS — Z20828 Contact with and (suspected) exposure to other viral communicable diseases: Secondary | ICD-10-CM | POA: Diagnosis present

## 2018-12-31 DIAGNOSIS — K2900 Acute gastritis without bleeding: Secondary | ICD-10-CM | POA: Diagnosis present

## 2018-12-31 DIAGNOSIS — F329 Major depressive disorder, single episode, unspecified: Secondary | ICD-10-CM | POA: Diagnosis present

## 2018-12-31 DIAGNOSIS — N179 Acute kidney failure, unspecified: Secondary | ICD-10-CM | POA: Diagnosis present

## 2018-12-31 DIAGNOSIS — K2970 Gastritis, unspecified, without bleeding: Secondary | ICD-10-CM

## 2018-12-31 DIAGNOSIS — E8729 Other acidosis: Secondary | ICD-10-CM | POA: Diagnosis present

## 2018-12-31 DIAGNOSIS — E785 Hyperlipidemia, unspecified: Secondary | ICD-10-CM

## 2018-12-31 DIAGNOSIS — R1013 Epigastric pain: Secondary | ICD-10-CM | POA: Diagnosis not present

## 2018-12-31 DIAGNOSIS — Z79899 Other long term (current) drug therapy: Secondary | ICD-10-CM | POA: Diagnosis not present

## 2018-12-31 DIAGNOSIS — E872 Acidosis, unspecified: Secondary | ICD-10-CM | POA: Diagnosis present

## 2018-12-31 DIAGNOSIS — J449 Chronic obstructive pulmonary disease, unspecified: Secondary | ICD-10-CM | POA: Diagnosis present

## 2018-12-31 DIAGNOSIS — I129 Hypertensive chronic kidney disease with stage 1 through stage 4 chronic kidney disease, or unspecified chronic kidney disease: Secondary | ICD-10-CM | POA: Diagnosis present

## 2018-12-31 DIAGNOSIS — K209 Esophagitis, unspecified without bleeding: Secondary | ICD-10-CM

## 2018-12-31 DIAGNOSIS — Z9114 Patient's other noncompliance with medication regimen: Secondary | ICD-10-CM

## 2018-12-31 DIAGNOSIS — E878 Other disorders of electrolyte and fluid balance, not elsewhere classified: Secondary | ICD-10-CM | POA: Diagnosis present

## 2018-12-31 DIAGNOSIS — K635 Polyp of colon: Secondary | ICD-10-CM | POA: Diagnosis present

## 2018-12-31 DIAGNOSIS — F1410 Cocaine abuse, uncomplicated: Secondary | ICD-10-CM | POA: Diagnosis present

## 2018-12-31 DIAGNOSIS — F1010 Alcohol abuse, uncomplicated: Secondary | ICD-10-CM | POA: Diagnosis present

## 2018-12-31 DIAGNOSIS — N184 Chronic kidney disease, stage 4 (severe): Secondary | ICD-10-CM | POA: Diagnosis present

## 2018-12-31 DIAGNOSIS — Z720 Tobacco use: Secondary | ICD-10-CM | POA: Diagnosis present

## 2018-12-31 DIAGNOSIS — F17210 Nicotine dependence, cigarettes, uncomplicated: Secondary | ICD-10-CM | POA: Diagnosis present

## 2018-12-31 DIAGNOSIS — Z79891 Long term (current) use of opiate analgesic: Secondary | ICD-10-CM

## 2018-12-31 DIAGNOSIS — F1910 Other psychoactive substance abuse, uncomplicated: Secondary | ICD-10-CM | POA: Diagnosis present

## 2018-12-31 DIAGNOSIS — D509 Iron deficiency anemia, unspecified: Secondary | ICD-10-CM

## 2018-12-31 DIAGNOSIS — Z599 Problem related to housing and economic circumstances, unspecified: Secondary | ICD-10-CM

## 2018-12-31 DIAGNOSIS — F1210 Cannabis abuse, uncomplicated: Secondary | ICD-10-CM | POA: Diagnosis present

## 2018-12-31 DIAGNOSIS — E871 Hypo-osmolality and hyponatremia: Secondary | ICD-10-CM | POA: Diagnosis present

## 2018-12-31 DIAGNOSIS — I10 Essential (primary) hypertension: Secondary | ICD-10-CM | POA: Diagnosis present

## 2018-12-31 DIAGNOSIS — L97929 Non-pressure chronic ulcer of unspecified part of left lower leg with unspecified severity: Secondary | ICD-10-CM | POA: Diagnosis present

## 2018-12-31 DIAGNOSIS — R1084 Generalized abdominal pain: Secondary | ICD-10-CM

## 2018-12-31 DIAGNOSIS — F419 Anxiety disorder, unspecified: Secondary | ICD-10-CM | POA: Diagnosis present

## 2018-12-31 LAB — CBC WITH DIFFERENTIAL/PLATELET
Abs Immature Granulocytes: 0.03 10*3/uL (ref 0.00–0.07)
Basophils Absolute: 0.1 10*3/uL (ref 0.0–0.1)
Basophils Relative: 1 %
Eosinophils Absolute: 0.2 10*3/uL (ref 0.0–0.5)
Eosinophils Relative: 2 %
HCT: 18.8 % — ABNORMAL LOW (ref 36.0–46.0)
Hemoglobin: 6.4 g/dL — CL (ref 12.0–15.0)
Immature Granulocytes: 0 %
Lymphocytes Relative: 20 %
Lymphs Abs: 1.7 10*3/uL (ref 0.7–4.0)
MCH: 31.5 pg (ref 26.0–34.0)
MCHC: 34 g/dL (ref 30.0–36.0)
MCV: 92.6 fL (ref 80.0–100.0)
Monocytes Absolute: 0.6 10*3/uL (ref 0.1–1.0)
Monocytes Relative: 7 %
Neutro Abs: 5.9 10*3/uL (ref 1.7–7.7)
Neutrophils Relative %: 70 %
Platelets: 328 10*3/uL (ref 150–400)
RBC: 2.03 MIL/uL — ABNORMAL LOW (ref 3.87–5.11)
RDW: 16.1 % — ABNORMAL HIGH (ref 11.5–15.5)
WBC: 8.4 10*3/uL (ref 4.0–10.5)
nRBC: 0 % (ref 0.0–0.2)

## 2018-12-31 LAB — PHOSPHORUS: Phosphorus: 4.6 mg/dL (ref 2.5–4.6)

## 2018-12-31 LAB — URINALYSIS, ROUTINE W REFLEX MICROSCOPIC
Bilirubin Urine: NEGATIVE
Glucose, UA: NEGATIVE mg/dL
Ketones, ur: NEGATIVE mg/dL
Nitrite: NEGATIVE
Protein, ur: 100 mg/dL — AB
Specific Gravity, Urine: 1.011 (ref 1.005–1.030)
pH: 7 (ref 5.0–8.0)

## 2018-12-31 LAB — RAPID URINE DRUG SCREEN, HOSP PERFORMED
Amphetamines: NOT DETECTED
Barbiturates: NOT DETECTED
Benzodiazepines: NOT DETECTED
Cocaine: POSITIVE — AB
Opiates: POSITIVE — AB
Tetrahydrocannabinol: NOT DETECTED

## 2018-12-31 LAB — COMPREHENSIVE METABOLIC PANEL
ALT: 22 U/L (ref 0–44)
AST: 30 U/L (ref 15–41)
Albumin: 3 g/dL — ABNORMAL LOW (ref 3.5–5.0)
Alkaline Phosphatase: 76 U/L (ref 38–126)
Anion gap: 17 — ABNORMAL HIGH (ref 5–15)
BUN: 67 mg/dL — ABNORMAL HIGH (ref 6–20)
CO2: 16 mmol/L — ABNORMAL LOW (ref 22–32)
Calcium: 8.8 mg/dL — ABNORMAL LOW (ref 8.9–10.3)
Chloride: 97 mmol/L — ABNORMAL LOW (ref 98–111)
Creatinine, Ser: 2.67 mg/dL — ABNORMAL HIGH (ref 0.44–1.00)
GFR calc Af Amer: 22 mL/min — ABNORMAL LOW (ref >60)
GFR calc non Af Amer: 19 mL/min — ABNORMAL LOW (ref >60)
Glucose, Bld: 116 mg/dL — ABNORMAL HIGH (ref 70–99)
Potassium: 3.4 mmol/L — ABNORMAL LOW (ref 3.5–5.1)
Sodium: 130 mmol/L — ABNORMAL LOW (ref 135–145)
Total Bilirubin: 0.7 mg/dL (ref 0.3–1.2)
Total Protein: 7.1 g/dL (ref 6.5–8.1)

## 2018-12-31 LAB — PREPARE RBC (CROSSMATCH)

## 2018-12-31 LAB — LACTIC ACID, PLASMA: Lactic Acid, Venous: 1.3 mmol/L (ref 0.5–1.9)

## 2018-12-31 LAB — TROPONIN I (HIGH SENSITIVITY)
Troponin I (High Sensitivity): 7 ng/L (ref <18)
Troponin I (High Sensitivity): 7 ng/L (ref <18)

## 2018-12-31 LAB — LIPASE, BLOOD: Lipase: 45 U/L (ref 11–51)

## 2018-12-31 LAB — ABO/RH: ABO/RH(D): A POS

## 2018-12-31 LAB — POC OCCULT BLOOD, ED: Fecal Occult Bld: POSITIVE — AB

## 2018-12-31 LAB — MAGNESIUM: Magnesium: 2.7 mg/dL — ABNORMAL HIGH (ref 1.7–2.4)

## 2018-12-31 LAB — SARS CORONAVIRUS 2 (TAT 6-24 HRS): SARS Coronavirus 2: NEGATIVE

## 2018-12-31 MED ORDER — VITAMIN B-1 100 MG PO TABS
100.0000 mg | ORAL_TABLET | Freq: Every day | ORAL | Status: DC
  Administered 2018-12-31 – 2019-01-02 (×2): 100 mg via ORAL
  Filled 2018-12-31 (×3): qty 1

## 2018-12-31 MED ORDER — SODIUM CHLORIDE 0.9 % IV SOLN
INTRAVENOUS | Status: DC
  Administered 2018-12-31 – 2019-01-02 (×4): via INTRAVENOUS

## 2018-12-31 MED ORDER — SODIUM CHLORIDE 0.9 % IV SOLN
8.0000 mg/h | INTRAVENOUS | Status: DC
  Administered 2018-12-31: 8 mg/h via INTRAVENOUS
  Filled 2018-12-31 (×3): qty 80

## 2018-12-31 MED ORDER — PANTOPRAZOLE SODIUM 40 MG IV SOLR: 40.0000 mg | Freq: Two times a day (BID) | INTRAVENOUS | Status: DC

## 2018-12-31 MED ORDER — MORPHINE SULFATE (PF) 4 MG/ML IV SOLN
4.0000 mg | INTRAVENOUS | Status: DC | PRN
  Administered 2018-12-31 – 2019-01-02 (×8): 4 mg via INTRAVENOUS
  Filled 2018-12-31 (×9): qty 1

## 2018-12-31 MED ORDER — NICOTINE 14 MG/24HR TD PT24
14.0000 mg | MEDICATED_PATCH | Freq: Every day | TRANSDERMAL | Status: DC
  Administered 2018-12-31 – 2019-01-02 (×3): 14 mg via TRANSDERMAL
  Filled 2018-12-31 (×3): qty 1

## 2018-12-31 MED ORDER — ACETAMINOPHEN 325 MG PO TABS
650.0000 mg | ORAL_TABLET | Freq: Four times a day (QID) | ORAL | Status: DC | PRN
  Administered 2019-01-02: 650 mg via ORAL
  Filled 2018-12-31: qty 2

## 2018-12-31 MED ORDER — ONDANSETRON HCL 4 MG/2ML IJ SOLN
4.0000 mg | Freq: Four times a day (QID) | INTRAMUSCULAR | Status: DC | PRN
  Administered 2018-12-31: 4 mg via INTRAVENOUS

## 2018-12-31 MED ORDER — HYDRALAZINE HCL 20 MG/ML IJ SOLN
10.0000 mg | Freq: Four times a day (QID) | INTRAMUSCULAR | Status: DC | PRN
  Administered 2018-12-31: 10 mg via INTRAVENOUS
  Filled 2018-12-31 (×2): qty 1

## 2018-12-31 MED ORDER — SODIUM CHLORIDE 0.9 % IV SOLN
80.0000 mg | Freq: Once | INTRAVENOUS | Status: AC
  Administered 2018-12-31: 80 mg via INTRAVENOUS
  Filled 2018-12-31: qty 80

## 2018-12-31 MED ORDER — SODIUM CHLORIDE 0.9 % IV SOLN
10.0000 mL/h | Freq: Once | INTRAVENOUS | Status: AC
  Administered 2018-12-31: 10 mL/h via INTRAVENOUS

## 2018-12-31 MED ORDER — ONDANSETRON HCL 4 MG PO TABS: 4.0000 mg | ORAL_TABLET | Freq: Four times a day (QID) | ORAL | Status: DC | PRN

## 2018-12-31 MED ORDER — ACETAMINOPHEN 650 MG RE SUPP: 650.0000 mg | Freq: Four times a day (QID) | RECTAL | Status: DC | PRN

## 2018-12-31 MED ORDER — FENTANYL CITRATE (PF) 100 MCG/2ML IJ SOLN
50.0000 ug | Freq: Once | INTRAMUSCULAR | Status: AC
  Administered 2018-12-31: 50 ug via INTRAVENOUS
  Filled 2018-12-31: qty 2

## 2018-12-31 MED ORDER — FOLIC ACID 1 MG PO TABS
1.0000 mg | ORAL_TABLET | Freq: Every day | ORAL | Status: DC
  Administered 2018-12-31 – 2019-01-02 (×2): 1 mg via ORAL
  Filled 2018-12-31 (×3): qty 1

## 2018-12-31 MED ORDER — ADULT MULTIVITAMIN W/MINERALS CH
1.0000 | ORAL_TABLET | Freq: Every day | ORAL | Status: DC
  Administered 2018-12-31 – 2019-01-02 (×2): 1 via ORAL
  Filled 2018-12-31 (×3): qty 1
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
## 2018-12-31 NOTE — H&P (Signed)
History and Physical    VIRGILENE [name] ENI:[id number] DOB: 10-26-1960 DOA: 12/31/2018  PCP: Ladell Pier, MD  Patient coming from: Home I have personally briefly reviewed patient's old medical records in [place]  Chief Complaint: Epigastric pain since 2 weeks  HPI: [name] is a 58 y.o. female with medical history significant of hypertension, tobacco abuse, alcohol abuse, polysubstance abuse, depression/anxiety presents to emergency department due to worsening epigastric pain since 2 weeks.  Reports severe pain, 10 out of 10, nonradiating, no aggravating or relieving factors, associated with nausea and vomiting, decreased appetite however denies association with hematemesis, melena, weight loss, night sweats or family history of colon cancer.  She takes over-the-counter NSAIDs almost every day.  Reports that she has been taking over-the-counter Pepto-Bismol, Alka-Seltzer with no help.    She has poor living condition.  Has no insurance and does not take any medications for her chronic medical issues at home due to financial issues.  She smokes 1 pack of cigarettes per day, drinks alcohol almost every day, uses marijuana however denies cocaine use.  She has healing ulcer on left leg.  Reports that she fell and tripped over a small piece of tree and since then she has a draining ulcer.  Reports that is getting better denies association with fever or chills.  She had colonoscopy in 2018 which showed 4 mm polyp in sigmoid colon and pathology came back positive for tubular adenoma.   ED Course: Her H&H: 6.4 dropped from 9.1 in 4 months.  Fecal occult came back positive.  Troponin: Negative, lipase: WNL.  Kidney function: Worsening.  EDP consulted GI for further management.  She received IV fluids and Protonix in ED.  Review of Systems: As per HPI otherwise negative.    Past Medical History:  Diagnosis Date  . Alcohol abuse   . Allergy   . Anxiety   . Arthritis   .  Asthma   . Cancer ([place])   . Cannabis abuse   . Cocaine abuse ([place])   . Depression   . Drug addiction ([place])   . GERD (gastroesophageal reflux disease)   . Homelessness   . Hypertension    pt stated "every once in a while BP will be high but has not been prescribed medication for HTN.   . Seasonal allergies   . Secondary diabetes mellitus with stage 3 chronic kidney disease (GFR 30-59) ([place]) 02/22/2016    Past Surgical History:  Procedure Laterality Date  . CESAREAN SECTION  1989  . CYSTOSCOPY W/ URETERAL STENT PLACEMENT Left 08/01/2018   Procedure: CYSTOSCOPY WITH RETROGRADE PYELOGRAM/URETERAL STENT PLACEMENT;  Surgeon: Lucas Mallow, MD;  Location: [facility];  Service: Urology;  Laterality: Left;  . FRACTURE SURGERY Left 2011     reports that she has been smoking cigarettes. She has been smoking about 1.00 pack per day for the past 0.00 years. She has never used smokeless tobacco. She reports current alcohol use of about 7.0 standard drinks of alcohol per week. She reports current drug use. Drugs: Cocaine and Marijuana.  Allergies  Allergen Reactions  . Lisinopril Other (See Comments)    hyperkalemia    Family History  Problem Relation Age of Onset  . Diabetes Father   . Colon cancer Neg Hx     Prior to Admission medications   Medication Sig Start Date End Date Taking? Authorizing Provider  albuterol (VENTOLIN HFA) 108 (90 Base) MCG/ACT inhaler INHALE 2 PUFFS INTO THE LUNGS  EVERY 6 HOURS AS NEEDED FOR WHEEZING OR SHORTNESS OF BREATH. Patient not taking: Reported on 07/31/2018 09/11/17   Argentina Donovan, PA-C  amLODipine (NORVASC) 10 MG tablet Take 1 tablet (10 mg total) by mouth daily. 08/05/18   Oswald Hillock, MD  atorvastatin (LIPITOR) 10 MG tablet Take 0.5 tablets (5 mg total) by mouth daily. Patient not taking: Reported on 07/31/2018 09/11/17   Argentina Donovan, PA-C  carvedilol (COREG) 6.25 MG tablet TAKE 1 TABLET BY MOUTH 2 TIMES DAILY WITH A MEAL 08/04/18   Oswald Hillock, MD  cetirizine (ZYRTEC) 10 MG tablet TAKE 1 TABLET BY MOUTH DAILY. Patient not taking: No sig reported 08/26/16   Boykin Nearing, MD  cyanocobalamin 1000 MCG tablet Take 1 tablet (1,000 mcg total) by mouth daily. Patient not taking: Reported on 07/31/2018 07/11/16   Boykin Nearing, MD  ferrous sulfate 325 (65 FE) MG tablet Take 1 tablet (325 mg total) by mouth 2 (two) times daily with a meal. 08/04/18   Oswald Hillock, MD  gabapentin (NEURONTIN) 300 MG capsule Take 1 capsule (300 mg total) by mouth 2 (two) times daily. Patient not taking: Reported on 07/31/2018 09/11/17   Argentina Donovan, PA-C  oxyCODONE-acetaminophen (PERCOCET) 5-325 MG tablet Take 1-2 tablets by mouth every 6 (six) hours as needed for severe pain. 08/04/18   Oswald Hillock, MD  pantoprazole (PROTONIX) 20 MG tablet Take 1 tablet (20 mg total) by mouth daily. Patient not taking: Reported on 07/31/2018 04/02/18   Mesner, Corene Cornea, MD  traZODone (DESYREL) 50 MG tablet Take 2 tablets (100 mg total) by mouth at bedtime as needed. for sleep Patient not taking: Reported on 07/31/2018 09/11/17   Argentina Donovan, [place]    Physical Exam: Vitals:   12/31/18 1418 12/31/18 1430 12/31/18 1500 12/31/18 1600  BP: 139/83 (!) 152/81 (!) 149/83 (!) 158/88  Pulse: 96 94 88   Resp: 16 19 15 17   Temp: 97.8 F (36.6 C)     TempSrc: Oral     SpO2: 100% 100% 100%   Weight:      Height:        Constitutional: NAD, calm, comfortable Vitals:   12/31/18 1418 12/31/18 1430 12/31/18 1500 12/31/18 1600  BP: 139/83 (!) 152/81 (!) 149/83 (!) 158/88  Pulse: 96 94 88   Resp: 16 19 15 17   Temp: 97.8 F (36.6 C)     TempSrc: Oral     SpO2: 100% 100% 100%   Weight:      Height:       Constitutional: Alert and oriented x3, in pain,  eyes: PERRL, lids and conjunctivae normal ENMT: Mucous membranes are moist. Posterior pharynx clear of any exudate or lesions.Normal dentition.  Neck: normal, supple, no masses, no thyromegaly Respiratory: clear to  auscultation bilaterally, no wheezing, no crackles. Normal respiratory effort. No accessory muscle use.  Cardiovascular: Regular rate and rhythm, no murmurs / rubs / gallops. No extremity edema. 2+ pedal pulses. No carotid bruits.  Abdomen: Epigastric tenderness positive, no guarding, no rigidity, no masses palpated. No hepatosplenomegaly. Bowel sounds positive.  Musculoskeletal: no clubbing / cyanosis. No joint deformity upper and lower extremities. Good ROM, no contractures. Normal muscle tone.  Skin:   Neurologic: CN 2-12 grossly intact. Sensation intact, DTR normal. Strength 5/5 in all 4.  Psychiatric: Normal judgment and insight. Alert and oriented x 3. Normal mood.    Labs on Admission: I have personally reviewed following labs and imaging studies  CBC:  Recent Labs  Lab 12/31/18 1451  WBC 8.4  NEUTROABS 5.9  HGB 6.4*  HCT 18.8*  MCV 92.6  PLT 633   Basic Metabolic Panel: Recent Labs  Lab 12/31/18 1451  NA 130*  K 3.4*  CL 97*  CO2 16*  GLUCOSE 116*  BUN 67*  CREATININE 2.67*  CALCIUM 8.8*   GFR: Estimated Creatinine Clearance: 22.3 mL/min (A) (by C-G formula based on SCr of 2.67 mg/dL (H)). Liver Function Tests: Recent Labs  Lab 12/31/18 1451  AST 30  ALT 22  ALKPHOS 76  BILITOT 0.7  PROT 7.1  ALBUMIN 3.0*   Recent Labs  Lab 12/31/18 1451  LIPASE 45   No results for input(s): AMMONIA in the last 168 hours. Coagulation Profile: No results for input(s): INR, PROTIME in the last 168 hours. Cardiac Enzymes: No results for input(s): CKTOTAL, CKMB, CKMBINDEX, TROPONINI in the last 168 hours. BNP (last 3 results) No results for input(s): PROBNP in the last 8760 hours. HbA1C: No results for input(s): HGBA1C in the last 72 hours. CBG: No results for input(s): GLUCAP in the last 168 hours. Lipid Profile: No results for input(s): CHOL, HDL, LDLCALC, TRIG, CHOLHDL, LDLDIRECT in the last 72 hours. Thyroid Function Tests: No results for input(s): TSH,  T4TOTAL, FREET4, T3FREE, THYROIDAB in the last 72 hours. Anemia Panel: No results for input(s): VITAMINB12, FOLATE, FERRITIN, TIBC, IRON, RETICCTPCT in the last 72 hours. Urine analysis:    Component Value Date/Time   COLORURINE YELLOW 07/31/2018 1718   APPEARANCEUR HAZY (A) 07/31/2018 1718   LABSPEC 1.004 (L) 07/31/2018 1718   PHURINE 7.0 07/31/2018 1718   GLUCOSEU NEGATIVE 07/31/2018 1718   HGBUR NEGATIVE 07/31/2018 1718   BILIRUBINUR NEGATIVE 07/31/2018 1718   KETONESUR NEGATIVE 07/31/2018 1718   PROTEINUR 30 (A) 07/31/2018 1718   UROBILINOGEN 0.2 03/02/2010 2027   NITRITE POSITIVE (A) 07/31/2018 1718   LEUKOCYTESUR LARGE (A) 07/31/2018 1718    Radiological Exams on Admission: Dg Abdomen Acute W/chest  Result Date: 12/31/2018 CLINICAL DATA:  Abdominal pain. EXAM: DG ABDOMEN ACUTE W/ 1V CHEST COMPARISON:  CT 07/31/2018. FINDINGS: Double-J left ureteral stent noted good anatomic position. No definite left renal or upper ureteral stone identified. Tiny punctate calcific density noted adjacent to the distal aspect of the left ureteral stent. This could represent a tiny stone fragment. Pelvic vascular calcifications noted. No bowel distention. Stool noted throughout the colon. Degenerative changes scoliosis lumbar spine and both hips. IMPRESSION: Double-J left ureteral stent in good anatomic position. No definite left renal or upper ureteral stone identified. Punctate calcification noted over the lower left pelvis adjacent to the stent, this could represent a tiny stone fragment. Electronically Signed   By: Marcello Moores  Register   On: 12/31/2018 15:28    EKG:  Assessment/Plan Principal Problem:   Acute upper GI bleed Active Problems:   Hypertension   Tobacco abuse   Alcohol abuse   CKD (chronic kidney disease), stage III   Chronic obstructive pulmonary disease (HCC)   Polysubstance abuse (HCC)   Acute upper GI bleed: -Likely secondary to NSAID induced gastritis -Patient presented  with severe epigastric pain, Hemoccult positive.  History of chronic over-the-counter use of NSAIDs. -H&H dropped from 9.1 to 6.4 in 4 months.  We will transfuse 2 unit of packed RBC -Monitor H&H closely.  Lipase: WNL.  Troponin: Negative. -Continue PPI drip, IV fluids, Zofran PRN for nausea and vomiting. -CT abdomen/pelvis without contrast: Came back negative for acute findings. -We will keep her n.p.o.  Likely EGD tomorrow a.m.  Hypertension: Blood pressure is elevated -Likely secondary to noncompliance with home meds.  She is supposed to take amlodipine and Coreg at home. -We will hold p.o. medicines for now.  Hydralazine PRN if blood pressure is more than 160/100 -Monitor blood pressure closely.  CKD stage IIIb: Worsening -Likely prerenal -Continue IV fluids.  Avoid nephrotoxic medication such as NSAIDs. -Repeat BMP tomorrow a.m.  Electrolyte abnormality: -Patient has low sodium of 130, low potassium 3.4, chloride of 97, CO2 16 -Likely secondary to nausea and vomiting  And poor oral intake -Continue IV fluids and monitor electrolytes closely.  Tubular adenoma: -Had a colonoscopy in 2018-4 mm polyps were removed, pathology result came back positive for tubular adenoma.  Anion gap metabolic acidosis: -Likely secondary to worsening kidney function -Monitor for now.  Left leg ulcer: Healing well  -No signs of cellulitis, No leukocytosis, afebrile  -Continue to monitor   tobacco abuse: -Counseled about cessation. -Started on nicotine patch  Polysubstance abuse: -History of marijuana and cocaine abuse.  Ordered UDS which is pending -Counseled about cessation.  Alcohol abuse: -Check magnesium level. -Start on CIWA's protocol -Start on folic acid/thiamine/multivitamin. -On seizure precautions.   DVT prophylaxis: TED/SCD/no Lovenox due to GI bleed.   Code Status: Full code  family Communication: None present at bedside.  Plan of care discussed with patient in length and  he verbalized understanding and agreed with it. Disposition Plan: To be determined Consults called: GI by EDP Admission status: Inpatient   Mckinley Jewel MD [facility] Pager [phone]  If 7PM-7AM, please contact night-coverage [email or web address] Password [email or web address]  12/31/2018, 5:07 PM
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
## 2018-12-31 NOTE — ED Notes (Addendum)
Dr. Alvino Chapel notified of Hgb 6.4

## 2018-12-31 NOTE — ED Notes (Signed)
Pt to CT

## 2018-12-31 NOTE — ED Triage Notes (Signed)
Patient arrives via EMS with several complaints. Per ems, patient has had chest pain x3 weeks, abdominal pain, loss of appetite, and generalized pain. Patient states the pain is making her have decreased mobility at home.  Patient also arrives with a laceration to the left shin with some drainage. Patient states she tripped over a tree stump.   Patient is noncompliant with medications due to not having insurance/unable to afford medications. Per EMS, patient has takes her boyfriend's BP medications and his muscle relaxants. EMS also stated concerns about patients living conditions as well.   Rates pain a 12/10.

## 2019-01-01 ENCOUNTER — Encounter (HOSPITAL_COMMUNITY): Payer: Self-pay | Admitting: Emergency Medicine

## 2019-01-01 ENCOUNTER — Encounter (HOSPITAL_COMMUNITY)
Admission: EM | Disposition: A | Discharge status: Home / Self Care | Payer: Self-pay | Source: Home / Self Care | Attending: Internal Medicine

## 2019-01-01 ENCOUNTER — Inpatient Hospital Stay (HOSPITAL_COMMUNITY): Payer: Medicare Other | Admitting: Anesthesiology

## 2019-01-01 DIAGNOSIS — K2990 Gastroduodenitis, unspecified, without bleeding: Secondary | ICD-10-CM

## 2019-01-01 DIAGNOSIS — K209 Esophagitis, unspecified without bleeding: Secondary | ICD-10-CM

## 2019-01-01 DIAGNOSIS — K2970 Gastritis, unspecified, without bleeding: Secondary | ICD-10-CM

## 2019-01-01 DIAGNOSIS — K269 Duodenal ulcer, unspecified as acute or chronic, without hemorrhage or perforation: Secondary | ICD-10-CM

## 2019-01-01 HISTORY — PX: BIOPSY: SHX5522

## 2019-01-01 HISTORY — PX: ESOPHAGOGASTRODUODENOSCOPY (EGD) WITH PROPOFOL: SHX5813

## 2019-01-01 LAB — CBC
HCT: 27.8 % — ABNORMAL LOW (ref 36.0–46.0)
Hemoglobin: 9.3 g/dL — ABNORMAL LOW (ref 12.0–15.0)
MCH: 31.5 pg (ref 26.0–34.0)
MCHC: 33.5 g/dL (ref 30.0–36.0)
MCV: 94.2 fL (ref 80.0–100.0)
Platelets: 238 10*3/uL (ref 150–400)
RBC: 2.95 MIL/uL — ABNORMAL LOW (ref 3.87–5.11)
RDW: 14.9 % (ref 11.5–15.5)
WBC: 6.5 10*3/uL (ref 4.0–10.5)
nRBC: 0 % (ref 0.0–0.2)

## 2019-01-01 LAB — COMPREHENSIVE METABOLIC PANEL
ALT: 19 U/L (ref 0–44)
AST: 26 U/L (ref 15–41)
Albumin: 2.9 g/dL — ABNORMAL LOW (ref 3.5–5.0)
Alkaline Phosphatase: 61 U/L (ref 38–126)
Anion gap: 12 (ref 5–15)
BUN: 59 mg/dL — ABNORMAL HIGH (ref 6–20)
CO2: 17 mmol/L — ABNORMAL LOW (ref 22–32)
Calcium: 8.3 mg/dL — ABNORMAL LOW (ref 8.9–10.3)
Chloride: 103 mmol/L (ref 98–111)
Creatinine, Ser: 2.51 mg/dL — ABNORMAL HIGH (ref 0.44–1.00)
GFR calc Af Amer: 24 mL/min — ABNORMAL LOW (ref >60)
GFR calc non Af Amer: 20 mL/min — ABNORMAL LOW (ref >60)
Glucose, Bld: 91 mg/dL (ref 70–99)
Potassium: 3.1 mmol/L — ABNORMAL LOW (ref 3.5–5.1)
Sodium: 132 mmol/L — ABNORMAL LOW (ref 135–145)
Total Bilirubin: 1.2 mg/dL (ref 0.3–1.2)
Total Protein: 6.4 g/dL — ABNORMAL LOW (ref 6.5–8.1)

## 2019-01-01 LAB — TYPE AND SCREEN
ABO/RH(D): A POS
Antibody Screen: NEGATIVE
Unit division: 0
Unit division: 0

## 2019-01-01 LAB — BPAM RBC
Blood Product Expiration Date: 202011052359
Blood Product Expiration Date: 202011062359
ISSUE DATE / TIME: 202010151742
ISSUE DATE / TIME: 202010152139
Unit Type and Rh: 6200
Unit Type and Rh: 6200

## 2019-01-01 SURGERY — ESOPHAGOGASTRODUODENOSCOPY (EGD) WITH PROPOFOL
Anesthesia: Monitor Anesthesia Care

## 2019-01-01 MED ORDER — SUCRALFATE 1 GM/10ML PO SUSP
1.0000 g | Freq: Three times a day (TID) | ORAL | Status: DC
  Administered 2019-01-01 – 2019-01-02 (×5): 1 g via ORAL
  Filled 2019-01-01 (×7): qty 10

## 2019-01-01 MED ORDER — CARVEDILOL 3.125 MG PO TABS
3.1250 mg | ORAL_TABLET | Freq: Two times a day (BID) | ORAL | Status: DC
  Administered 2019-01-02 (×2): 3.125 mg via ORAL
  Filled 2019-01-01 (×2): qty 1

## 2019-01-01 MED ORDER — KETAMINE HCL 10 MG/ML IJ SOLN
INTRAMUSCULAR | Status: DC | PRN
  Administered 2019-01-01 (×2): 10 mg via INTRAVENOUS

## 2019-01-01 MED ORDER — AMLODIPINE BESYLATE 10 MG PO TABS
10.0000 mg | ORAL_TABLET | Freq: Every day | ORAL | Status: DC
  Administered 2019-01-01 – 2019-01-02 (×2): 10 mg via ORAL
  Filled 2019-01-01 (×2): qty 1

## 2019-01-01 MED ORDER — PANTOPRAZOLE SODIUM 40 MG PO TBEC
40.0000 mg | DELAYED_RELEASE_TABLET | Freq: Two times a day (BID) | ORAL | Status: DC
  Administered 2019-01-01 – 2019-01-02 (×3): 40 mg via ORAL
  Filled 2019-01-01 (×3): qty 1

## 2019-01-01 MED ORDER — PROPOFOL 10 MG/ML IV BOLUS
INTRAVENOUS | Status: DC | PRN
  Administered 2019-01-01 (×4): 20 mg via INTRAVENOUS

## 2019-01-01 MED ORDER — LIDOCAINE 2% (20 MG/ML) 5 ML SYRINGE
INTRAMUSCULAR | Status: DC | PRN
  Administered 2019-01-01: 100 mg via INTRAVENOUS

## 2019-01-01 MED ORDER — PROPOFOL 500 MG/50ML IV EMUL
INTRAVENOUS | Status: DC | PRN
  Administered 2019-01-01: 125 ug/kg/min via INTRAVENOUS

## 2019-01-01 SURGICAL SUPPLY — 15 items

## 2019-01-01 NOTE — Transfer of Care (Signed)
Immediate Anesthesia Transfer of Care Note  Patient: [name]  Procedure(s) Performed: ESOPHAGOGASTRODUODENOSCOPY (EGD) WITH PROPOFOL (N/A )  Patient Location: Endoscopy Unit  Anesthesia Type:MAC  Level of Consciousness: awake  Airway & Oxygen Therapy: Patient Spontanous Breathing and Patient connected to nasal cannula oxygen  Post-op Assessment: Report given to RN and Post -op Vital signs reviewed and stable  Post vital signs: Reviewed and stable  Last Vitals:  Vitals Value Taken Time  BP 100/53 01/01/19 1255  Temp    Pulse 58 01/01/19 1301  Resp 22 01/01/19 1301  SpO2 85 % 01/01/19 1301  Vitals shown include unvalidated device data.  Last Pain:  Vitals:   01/01/19 1129  TempSrc: Oral  PainSc: 10-Worst pain ever         Complications: No apparent anesthesia complications

## 2019-01-01 NOTE — Op Note (Signed)
[facility] Patient Name: [name] Procedure Date : 01/01/2019 MRN: [id number] Attending MD: Jerene Bears , MD Date of Birth: 05/17/1960 CSN: [id number] Age: 58 Admit Type: Inpatient Procedure:                Upper GI endoscopy Indications:              Epigastric abdominal pain, Acute post hemorrhagic                            anemia, Melena Providers:                Lajuan Lines. Hilarie Fredrickson, MD, Carlyn Reichert, RN, Lina Sar,                            Technician Referring MD:             [facility] Medicines:                Monitored Anesthesia Care Complications:            No immediate complications. Estimated Blood Loss:     Estimated blood loss was minimal. Procedure:                Pre-Anesthesia Assessment:                           - Prior to the procedure, a History and Physical                            was performed, and patient medications and                            allergies were reviewed. The patient's tolerance of                            previous anesthesia was also reviewed. The risks                            and benefits of the procedure and the sedation                            options and risks were discussed with the patient.                            All questions were answered, and informed consent                            was obtained. Prior Anticoagulants: The patient has                            taken no previous anticoagulant or antiplatelet                            agents. ASA Grade Assessment: III - A patient with  severe systemic disease. After reviewing the risks                            and benefits, the patient was deemed in                            satisfactory condition to undergo the procedure.                           After obtaining informed consent, the endoscope was                            passed under direct vision. Throughout the   procedure, the patient's blood pressure, pulse, and                            oxygen saturations were monitored continuously. The                            GIF-H190 ([id number] Olympus gastroscope was                            introduced through the mouth, and advanced to the                            second part of duodenum. The upper GI endoscopy was                            accomplished without difficulty. The patient                            tolerated the procedure well. Scope In: Scope Out: Findings:      LA Grade D (one or more mucosal breaks involving at least 75% of       esophageal circumference) esophagitis with no bleeding was found 36 to       40 cm from the incisors. Biopsies were taken with a cold forceps for       histology.      The cardia and gastric fundus were normal on retroflexion.      Moderate inflammation characterized by congestion (edema), erosions and       erythema was found in the gastric antrum. Biopsies were taken with a       cold forceps for histology and Helicobacter pylori testing.      Four non-bleeding cratered duodenal ulcers with no stigmata of bleeding       were found in the duodenal bulb. The largest lesion was 8 mm in largest       dimension. Severe bulbar duodenitis. Impression:               - LA Grade D acute esophagitis. Biopsied.                           - Acute gastritis. Biopsied.                           - Non-bleeding duodenal ulcers with  no stigmata of                            bleeding with severe bulbar duodenitis. Moderate Sedation:      N/A Recommendation:           - Return patient to hospital ward for ongoing care.                           - Advance diet as tolerated.                           - Continue present medications.                           - BID PO PPI for 8 weeks.                           - No aspirin, ibuprofen, naproxen, or other                            non-steroidal anti-inflammatory drugs.                            - Patient advised to seek rehabilitation for                            substance abuse as cocaine also contributes to GI                            tract ischemia and wound/ulcer healing.                           - Await pathology results to exclude H. Pylori.                           - GI will follow-up pathology results and                            communicate these results to the patient.                           - Please call with questions. Procedure Code(s):        --- Professional ---                           [phone], Esophagogastroduodenoscopy, flexible,                            transoral; with biopsy, single or multiple Diagnosis Code(s):        --- Professional ---                           K20.9, Esophagitis, unspecified                           K29.00, Acute gastritis without bleeding  K26.9, Duodenal ulcer, unspecified as acute or                            chronic, without hemorrhage or perforation                           R10.13, Epigastric pain                           D62, Acute posthemorrhagic anemia                           K92.1, Melena (includes Hematochezia) CPT copyright 2019 American Medical Association. All rights reserved. The codes documented in this report are preliminary and upon coder review may  be revised to meet current compliance requirements. Jerene Bears, MD 01/01/2019 12:55:42 PM This report has been signed electronically. Number of Addenda: 0

## 2019-01-01 NOTE — ED Notes (Signed)
Patient transported to endo.  

## 2019-01-01 NOTE — Progress Notes (Signed)
PROGRESS NOTE  [name]  DOB: 1960-07-30  PCP: Ladell Pier, MD QPY:[id number]  DOA: 12/31/2018  LOS: 1 day   Brief narrative: Patient is a 58 y.o. female with PMH of hypertension, polysubstance abuse, depression/anxiety who has a  poor living condition, smokes 1 pack daily, drinks alcohol daily, uses marijuana, cocaine, does not have medical insurance and unable to afford medications for her chronic medical issues. She presented to the ED on 10/15 with complaint of worsening epigastric pain for 2 weeks. She takes over-the-counter NSAIDs almost every day.  Reports that she has been taking over-the-counter Pepto-Bismol, Alka-Seltzer with no help.   She also has healing ulcer on left leg.  Reports that she fell and tripped over a small piece of tree and since then she has a draining ulcer.  Reports that is getting better denies association with fever or chills.  In the ED, hemoglobin was low at 6.4 which is a drop from 9.1 in 4 months.  FOBT positive. Lipase level normal. CT abdomen pelvis was negative for any intra-abdominal pathology. GI was consulted from ED.  Patient was admitted to hospitalist medicine service for further evaluation and management.  Subjective: Patient was seen and examined this afternoon in the ED.  She was tearful in pain.  She underwent EGD earlier.  Findings as below.  Assessment/Plan: Acute upper GI bleed: -Likely secondary to NSAID induced gastritis -Patient presented with severe epigastric pain, Hemoccult positive.  History of chronic over-the-counter use of NSAIDs. -H&H dropped from 9.1 to 6.4 in 4 months.   2 units of PRBC transfused.  Monitor hemoglobin. -Underwent EGD this morning.  Found to have LA grade D acute esophagitis, acute gastritis, severe duodenitis and nonbleeding duodenal ulcers with no stigmata of bleeding. -Currently on PPI drip.  Recommended to PPI twice daily for 8 weeks. -Avoid NSAID, aspirin.  Hypertension:  -Blood  pressure is elevated on arrival. -Likely secondary to noncompliance with home meds.  She is supposed to take amlodipine and Coreg at home. -Resume home medicines. -Hydralazine PRN if blood pressure is more than 160/100 -Monitor blood pressure closely.  AKI on CKD stage IIIb:  -Creatinine 2.67 at presentation.  Baseline 2.18 from May 2020. -Currently on normal saline at 125 mL/h. -Continue the same.  Repeat renal function tomorrow.  Electrolyte abnormality: -Patient has low sodium of 130, low potassium 3.4, chloride of 97, CO2 16 -Likely secondary to nausea and vomiting  And poor oral intake -Continue IV fluids and monitor electrolytes closely.  Tubular adenoma: -Had a colonoscopy in 2018-4 mm polyps were removed, pathology result came back positive for tubular adenoma.  Anion gap metabolic acidosis: -Likely secondary to worsening kidney function -Monitor for now.  Left leg ulcer:  -Healing well  -No signs of cellulitis, No leukocytosis, afebrile  -Continue to monitor   Tobacco abuse: -Counseled about cessation. -Started on nicotine patch  Polysubstance abuse: -History of marijuana and cocaine abuse.   -Urine drug screen positive for cocaine and opiate -Counseled about cessation.  Alcohol abuse: -Check magnesium level. -Start on CIWA's protocol -Start on folic acid/thiamine/multivitamin. -On seizure precautions.  Body mass index is 22.24 kg/m. Mobility: Encourage ambulation. DVT prophylaxis:  SCDs Code Status:   Code Status: Full Code  Family Communication:  Expected Discharge: Anticipate discharge home in next 1 to 2 days if hemoglobin stable.  Consultants:  GI  Procedures:  EGD today  Antimicrobials: Anti-infectives (From admission, onward)   None      Diet Order  Diet full liquid Room service appropriate? Yes; Fluid consistency: Thin  Diet effective now              Infusions:  . sodium chloride 125 mL/hr at 01/01/19 1425     Scheduled Meds: . folic acid  1 mg Oral Daily  . multivitamin with minerals  1 tablet Oral Daily  . nicotine  14 mg Transdermal Daily  . pantoprazole  40 mg Oral BID AC  . sucralfate  1 g Oral TID WC & HS  . thiamine  100 mg Oral Daily    PRN meds: acetaminophen **OR** acetaminophen, hydrALAZINE, morphine injection, ondansetron **OR** ondansetron (ZOFRAN) IV   Objective: Vitals:   01/01/19 1426 01/01/19 1447  BP: (!) 145/91 (!) 146/78  Pulse: 92 87  Resp: 12 (!) 22  Temp:    SpO2: 100% 100%    Intake/Output Summary (Last 24 hours) at 01/01/2019 1626 Last data filed at 01/01/2019 1246 Gross per 24 hour  Intake 830 ml  Output -  Net 830 ml   Filed Weights   12/31/18 1417 01/01/19 1129  Weight: 64.4 kg 64.4 kg   Weight change:  Body mass index is 22.24 kg/m.   Physical Exam: General exam: Appears calm and comfortable.  Skin: No rashes, lesions or ulcers. HEENT: Atraumatic, normocephalic, supple neck, no obvious bleeding Lungs: Clear to auscultation bilaterally CVS: Regular rate and rhythm, no murmur GI/Abd soft, tender epigastrium, nondistended, bowel sound present CNS: Alert, awake, oriented x3 Psychiatry: Tearful Extremities: No pedal edema, no calf tenderness  Data Review: I have personally reviewed the laboratory data and studies available.  Recent Labs  Lab 12/31/18 1451 01/01/19 0333  WBC 8.4 6.5  NEUTROABS 5.9  --   HGB 6.4* 9.3*  HCT 18.8* 27.8*  MCV 92.6 94.2  PLT 328 238   Recent Labs  Lab 12/31/18 1451 12/31/18 1700 01/01/19 0333  NA 130*  --  132*  K 3.4*  --  3.1*  CL 97*  --  103  CO2 16*  --  17*  GLUCOSE 116*  --  91  BUN 67*  --  59*  CREATININE 2.67*  --  2.51*  CALCIUM 8.8*  --  8.3*  MG  --  2.7*  --   PHOS  --  4.6  --     Terrilee Croak, MD  [facility] 01/01/2019

## 2019-01-01 NOTE — Anesthesia Preprocedure Evaluation (Addendum)
Anesthesia Evaluation  Patient identified by MRN, date of birth, ID band Patient awake    Reviewed: Allergy & Precautions, H&P , NPO status , Patient's Chart, lab work & pertinent test results, reviewed documented beta blocker date and time   Airway Mallampati: II  TM Distance: >3 FB Neck ROM: Full    Dental no notable dental hx. (+) Edentulous Upper, Edentulous Lower, Dental Advisory Given   Pulmonary asthma , COPD,  COPD inhaler, Current Smoker and Patient abstained from smoking.,    Pulmonary exam normal breath sounds clear to auscultation       Cardiovascular hypertension, Pt. on medications and Pt. on home beta blockers  Rhythm:Regular Rate:Normal     Neuro/Psych Anxiety Depression negative neurological ROS     GI/Hepatic GERD  ,(+)     substance abuse  cocaine use and marijuana use,   Endo/Other  diabetes  Renal/GU Renal InsufficiencyRenal diseasenegative Renal ROS  negative genitourinary   Musculoskeletal  (+) Arthritis ,   Abdominal   Peds  Hematology  (+) Blood dyscrasia, anemia ,   Anesthesia Other Findings   Reproductive/Obstetrics negative OB ROS                            Anesthesia Physical Anesthesia Plan  ASA: III  Anesthesia Plan: MAC   Post-op Pain Management:    Induction: Intravenous  PONV Risk Score and Plan: 1 and Propofol infusion  Airway Management Planned: Nasal Cannula  Additional Equipment:   Intra-op Plan:   Post-operative Plan:   Informed Consent: I have reviewed the patients History and Physical, chart, labs and discussed the procedure including the risks, benefits and alternatives for the proposed anesthesia with the patient or authorized representative who has indicated his/her understanding and acceptance.     Dental advisory given  Plan Discussed with: CRNA  Anesthesia Plan Comments:         Anesthesia Quick Evaluation

## 2019-01-01 NOTE — Plan of Care (Signed)
  Problem: Health Behavior/Discharge Planning: Goal: Ability to manage health-related needs will improve Outcome: Progressing   Problem: Clinical Measurements: Goal: Will remain free from infection Outcome: Progressing   Problem: Coping: Goal: Level of anxiety will decrease Outcome: Progressing   Problem: Pain Managment: Goal: General experience of comfort will improve Outcome: Progressing   Problem: Safety: Goal: Ability to remain free from injury will improve Outcome: Progressing   Problem: Skin Integrity: Goal: Risk for impaired skin integrity will decrease Outcome: Emanuel, RN 01/01/2019 1919

## 2019-01-01 NOTE — Interval H&P Note (Signed)
History and Physical Interval Note: For EGD to eval epigastric pain.  Pain somewhat better today. The nature of the procedure, as well as the risks, benefits, and alternatives were carefully and thoroughly reviewed with the patient. Ample time for discussion and questions allowed. The patient understood, was satisfied, and agreed to proceed.   CBC Latest Ref Rng & Units 01/01/2019 12/31/2018 08/04/2018  WBC 4.0 - 10.5 K/uL 6.5 8.4 9.7  Hemoglobin 12.0 - 15.0 g/dL 9.3(L) 6.4(LL) 9.1(L)  Hematocrit 36.0 - 46.0 % 27.8(L) 18.8(L) 28.7(L)  Platelets 150 - 400 K/uL 238 328 267     01/01/2019 12:28 PM  [name]  has presented today for surgery, with the diagnosis of Epigastric pain, anemia, heme positive stools, GI bleed.  The various methods of treatment have been discussed with the patient and family. After consideration of risks, benefits and other options for treatment, the patient has consented to  Procedure(s): ESOPHAGOGASTRODUODENOSCOPY (EGD) WITH PROPOFOL (N/A) as a surgical intervention.  The patient's history has been reviewed, patient examined, no change in status, stable for surgery.  I have reviewed the patient's chart and labs.  Questions were answered to the patient's satisfaction.     Lajuan Lines Lorry Furber

## 2019-01-01 NOTE — ED Notes (Signed)
Liquid diet tray ordered

## 2019-01-01 NOTE — ED Notes (Signed)
Pt requesting food and drink.

## 2019-01-01 NOTE — Anesthesia Postprocedure Evaluation (Signed)
Anesthesia Post Note  Patient: [name]  Procedure(s) Performed: ESOPHAGOGASTRODUODENOSCOPY (EGD) WITH PROPOFOL (N/A )     Patient location during evaluation: Endoscopy Anesthesia Type: MAC Level of consciousness: awake and alert Pain management: pain level controlled Vital Signs Assessment: post-procedure vital signs reviewed and stable Respiratory status: spontaneous breathing, nonlabored ventilation and respiratory function stable Cardiovascular status: stable and blood pressure returned to baseline Postop Assessment: no apparent nausea or vomiting Anesthetic complications: no    Last Vitals:  Vitals:   01/01/19 1305 01/01/19 1315  BP: (!) 144/66 (!) 161/65  Pulse: 78 72  Resp: 19 (!) 9  Temp:    SpO2: 100% 100%    Last Pain:  Vitals:   01/01/19 1310  TempSrc:   PainSc: 5                  Hania Cerone,W. EDMOND

## 2019-01-02 MED ORDER — CYANOCOBALAMIN 1000 MCG PO TABS: 1000.0000 ug | ORAL_TABLET | Freq: Every day | ORAL | 2 refills | Status: DC

## 2019-01-02 MED ORDER — ADULT MULTIVITAMIN W/MINERALS CH: 1.0000 | ORAL_TABLET | Freq: Every day | ORAL | 1 refills | Status: DC

## 2019-01-02 MED ORDER — ATORVASTATIN CALCIUM 10 MG PO TABS: 5.0000 mg | ORAL_TABLET | Freq: Every day | ORAL | 3 refills | Status: DC

## 2019-01-02 MED ORDER — INFLUENZA VAC SPLIT QUAD 0.5 ML IM SUSY
0.5000 mL | PREFILLED_SYRINGE | Freq: Once | INTRAMUSCULAR | Status: AC
  Administered 2019-01-02: 0.5 mL via INTRAMUSCULAR
  Filled 2019-01-02: qty 0.5

## 2019-01-02 MED ORDER — CARVEDILOL 3.125 MG PO TABS: 3.1250 mg | ORAL_TABLET | Freq: Two times a day (BID) | ORAL | 0 refills | Status: DC

## 2019-01-02 MED ORDER — THIAMINE HCL 100 MG PO TABS: 100.0000 mg | ORAL_TABLET | Freq: Every day | ORAL | 1 refills | Status: DC

## 2019-01-02 MED ORDER — SUCRALFATE 1 GM/10ML PO SUSP: 1.0000 g | Freq: Three times a day (TID) | ORAL | 0 refills | Status: DC

## 2019-01-02 MED ORDER — AMLODIPINE BESYLATE 10 MG PO TABS: 10.0000 mg | ORAL_TABLET | Freq: Every day | ORAL | 2 refills | Status: DC

## 2019-01-02 MED ORDER — FOLIC ACID 1 MG PO TABS: 1.0000 mg | ORAL_TABLET | Freq: Every day | ORAL | 1 refills | Status: DC

## 2019-01-02 MED ORDER — POTASSIUM CHLORIDE CRYS ER 20 MEQ PO TBCR
20.0000 meq | EXTENDED_RELEASE_TABLET | Freq: Once | ORAL | Status: AC
  Administered 2019-01-02: 20 meq via ORAL
  Filled 2019-01-02: qty 1

## 2019-01-02 MED ORDER — FERROUS SULFATE 325 (65 FE) MG PO TABS: 325.0000 mg | ORAL_TABLET | Freq: Two times a day (BID) | ORAL | 3 refills | Status: DC

## 2019-01-02 MED ORDER — ALBUTEROL SULFATE HFA 108 (90 BASE) MCG/ACT IN AERS: INHALATION_SPRAY | RESPIRATORY_TRACT | 6 refills | Status: DC

## 2019-01-02 MED ORDER — PANTOPRAZOLE SODIUM 40 MG PO TBEC: 40.0000 mg | DELAYED_RELEASE_TABLET | Freq: Two times a day (BID) | ORAL | 1 refills | Status: DC

## 2019-01-02 MED ORDER — INFLUENZA VAC SPLIT QUAD 0.5 ML IM SUSY: 0.5000 mL | PREFILLED_SYRINGE | INTRAMUSCULAR | Status: DC

## 2019-01-02 NOTE — Progress Notes (Signed)
Discharge instructions provided.  Pt verbalizes understanding of all instructions and follow-up care.  Prescriptions given. Taxi called to transport patient to home. Pt discharged to home at 1749 on 01/02/19 via wheelchair by RN.

## 2019-01-02 NOTE — Discharge Summary (Signed)
Physician Discharge Summary  Patient ID: ONIKA GUDIEL MRN: [id number] DOB/AGE: 1960/04/04 58 y.o.  Admit date: 12/31/2018 Discharge date: 01/02/2019  Admission Diagnoses:  Discharge Diagnoses:  Principal Problem:   Acute upper GI bleed Active Problems:   Hypertension   Tobacco abuse   Alcohol abuse   CKD (chronic kidney disease), stage III   Chronic obstructive pulmonary disease (HCC)   Polysubstance abuse (HCC)   Electrolyte abnormality   High anion gap metabolic acidosis   Acute esophagitis   Gastritis and gastroduodenitis   Duodenal ulcer disease   Discharged Condition: stable  Hospital Course: Patient is a 58 year old Caucasian female with past medical history significant for polysubstance abuse, hypertension, depression, anxiety, tobacco use and alcohol abuse.  Specifically, patient abuses cannabinoids and cocaine.  Poor living condition was also reported.  Patient presented with 2-week history of worsening epigastric pain.  History of NSAID use was endorsed.  On presentation to the hospital, patient was found to have hemoglobin of 6.4 g/dL, with positive fecal occult blood.  CT of the abdomen and pelvis pelvis was negative for any intra-abdominal pathology.  Patient was admitted for further assessment and management.  Gastroenterology team was consulted to assist with patient's management.  Patient underwent EGD.  Patient was transfused with packed red blood cells.  Prior to discharge, patient's hemoglobin was 9.3 g/dL.  Patient was managed with PPIs.  Patient will continue PPIs for at least 8 weeks after discharge.  Patient has been advised to avoid NSAIDs.  Patient is stable for discharge.  Patient will follow with PCP and GI team on discharge.  P  Blood loss anemia, likely secondary to upper GI bleed:  -Currently see EGD findings.   -Also noted above documentation.   -Patient will continue with PPIs for the next 8 weeks.   -Patient has been advised to avoid NSAIDs.    -Patient will need to follow-up with GI team on discharge.    Hypertension:  -Blood pressure was elevated on arrival. -Likely secondary to noncompliance with home meds.  -Home medications were resumed. -Continue to monitor blood pressure.  Electrolyte abnormality: -Sodium and potassium were noted to be low on presentation.   -Reviewing patient's records, patient has problem with hyponatremia for a while. -Continue to monitor and replete electrolytes. -Further management per primary care provider on discharge.  Tubular adenoma: -Had a colonoscopy in 2018-4 mm polyps were removed,pathology result came back positive for tubular adenoma. -Will defer further screening as per protocol to the primary care provider and the GI team.  Left leg ulcer:  -Healing well -No signs of cellulitis,No leukocytosis, afebrile -Continue to monitor   Tobacco abuse: -Counseled to quit tobacco use.    Polysubstance abuse: -History of marijuana and cocaine abuse.  -Urine drug screen positive for cocaine and opiate -Counseled to quit illicit substances.    Alcohol abuse: -CIWA's protocol was started on presentation -No withdrawal symptoms/signs noted during the hospital stay.  -Counseled patient to quit alcohol use.  Consults: GI  Significant Diagnostic Studies:  EGD revealed: - LA Grade D acute esophagitis. Biopsied. - Acute gastritis. Biopsied. - Non-bleeding duodenal ulcers with no stigmata of bleeding with severe bulbar duodenitis.  Hemoglobin on presentation was 6.4 g/dL.  Last hemoglobin checked prior to discharge was 9.3 g/dL.  Discharge Exam: Blood pressure 129/71, pulse 74, temperature 98 F (36.7 C), temperature source Oral, resp. rate 20, height 5\' 7"  (1.702 m), weight 64.4 kg, SpO2 100 %.   Disposition: Discharge disposition: 01-Home or Self Care  Discharge Instructions    Diet - low sodium heart healthy   Complete by: As directed    Increase activity slowly    Complete by: As directed      Allergies as of 01/02/2019      Reactions   Lisinopril Other (See Comments)   hyperkalemia      Medication List    STOP taking these medications   cetirizine 10 MG tablet Commonly known as: ZYRTEC   gabapentin 300 MG capsule Commonly known as: NEURONTIN   ibuprofen 200 MG tablet Commonly known as: ADVIL   oxyCODONE-acetaminophen 5-325 MG tablet Commonly known as: Percocet   traZODone 50 MG tablet Commonly known as: DESYREL     TAKE these medications   albuterol 108 (90 Base) MCG/ACT inhaler Commonly known as: Ventolin HFA INHALE 2 PUFFS INTO THE LUNGS EVERY 6 HOURS AS NEEDED FOR WHEEZING OR SHORTNESS OF BREATH.   amLODipine 10 MG tablet Commonly known as: NORVASC Take 1 tablet (10 mg total) by mouth daily.   atorvastatin 10 MG tablet Commonly known as: LIPITOR Take 0.5 tablets (5 mg total) by mouth daily.   carvedilol 3.125 MG tablet Commonly known as: COREG Take 1 tablet (3.125 mg total) by mouth 2 (two) times daily with a meal. What changed:   medication strength  how much to take  how to take this  when to take this  additional instructions   cyanocobalamin 1000 MCG tablet Take 1 tablet (1,000 mcg total) by mouth daily.   ferrous sulfate 325 (65 FE) MG tablet Take 1 tablet (325 mg total) by mouth 2 (two) times daily with a meal.   folic acid 1 MG tablet Commonly known as: FOLVITE Take 1 tablet (1 mg total) by mouth daily. Start taking on: January 03, 2019   multivitamin with minerals Tabs tablet Take 1 tablet by mouth daily. Start taking on: January 03, 2019   pantoprazole 40 MG tablet Commonly known as: PROTONIX Take 1 tablet (40 mg total) by mouth 2 (two) times daily before a meal. What changed:   medication strength  how much to take  when to take this   sucralfate 1 GM/10ML suspension Commonly known as: CARAFATE Take 10 mLs (1 g total) by mouth 4 (four) times daily -  with meals and at  bedtime.   thiamine 100 MG tablet Take 1 tablet (100 mg total) by mouth daily. Start taking on: January 03, 2019        Signed: Bonnell Public 01/02/2019, 3:32 PM

## 2019-01-02 NOTE — Plan of Care (Signed)
  Problem: Education: Goal: Knowledge of General Education information will improve Description Including pain rating scale, medication(s)/side effects and non-pharmacologic comfort measures Outcome: Progressing   Problem: Clinical Measurements: Goal: Will remain free from infection Outcome: Progressing   Problem: Activity: Goal: Risk for activity intolerance will decrease Outcome: Progressing   Problem: Coping: Goal: Level of anxiety will decrease Outcome: Progressing   Problem: Pain Managment: Goal: General experience of comfort will improve Outcome: Progressing   Problem: Safety: Goal: Ability to remain free from injury will improve Outcome: Progressing   

## 2019-01-02 NOTE — TOC Transition Note (Signed)
Transition of Care [facility]) - CM/SW Discharge Note   Patient Details  Name: [name] MRN: [id number] Date of Birth: May 20, 1960  Transition of Care [facility]) CM/SW Contact:  Zenon Mayo, RN Phone Number: 01/02/2019, 4:20 PM   Clinical Narrative:    From home with friend, she states she has a friend she has been with for a [place] time and he will help her get  Her meds with the Match Letter 3.00 each.  She states she has no transportation to get home and no way for someone to pick her up.  NCM assisted her with cab for transport.  NCM informed her to call the [facility] on Monday to make a follow up apt she states she will have to find transportation to get there also.   Final next level of care: Home/Self Care Barriers to Discharge: No Barriers Identified   Patient Goals and CMS Choice Patient states their goals for this hospitalization and ongoing recovery are:: get well   Choice offered to / list presented to : NA  Discharge Placement                       Discharge Plan and Services   Discharge Planning Services: CM Consult, [facility], [facility] Program, Medication Assistance Post Acute Care Choice: NA          DME Arranged: (NA)         HH Arranged: NA          Social Determinants of Health (SDOH) Interventions     Readmission Risk Interventions Readmission Risk Prevention Plan 01/02/2019  Transportation Screening Complete  PCP or Specialist Appt within 3-5 Days Complete  HRI or [place] Complete  Social Work Consult for [place] Planning/Counseling Complete  Palliative Care Screening Not Applicable  Medication Review Press photographer) Complete  Some recent data might be hidden

## 2019-01-02 NOTE — Progress Notes (Signed)
CSW acknowledge consult for meds, alerted RN case manager of request.
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
## 2019-01-03 ENCOUNTER — Encounter (HOSPITAL_COMMUNITY): Payer: Self-pay | Admitting: Internal Medicine

## 2019-01-05 LAB — SURGICAL PATHOLOGY

## 2019-01-07 ENCOUNTER — Ambulatory Visit: Payer: Medicare Other | Admitting: Internal Medicine

## 2019-01-07 ENCOUNTER — Encounter: Payer: Self-pay | Admitting: Internal Medicine

## 2019-01-07 MED FILL — FERROUS SULFATE 325 MG TAB: 325 (65 FE) | 30 days supply | Qty: 60 | Fill #0

## 2019-01-07 MED FILL — FOLIC ACID 1 MG TABS: 1 | 30 days supply | Qty: 30 | Fill #0

## 2019-01-07 MED FILL — ATORVASTATIN 10 MG TABLET: 10 | 30 days supply | Qty: 15 | Fill #0

## 2019-01-07 MED FILL — CARVEDILOL 3.125 MG TABLET: 3.125 | 30 days supply | Qty: 60 | Fill #0

## 2019-01-07 MED FILL — PANTOPRAZOLE SOD DR 40 MG T: 40 | 30 days supply | Qty: 60 | Fill #0

## 2019-01-07 MED FILL — CARAFATE 1 GM/10 ML SUSP: 1 | 10 days supply | Qty: 420 | Fill #0

## 2019-01-07 MED FILL — AMLODIPINE BESYLATE 10 MG T: 10 | 30 days supply | Qty: 30 | Fill #0

## 2019-01-12 ENCOUNTER — Other Ambulatory Visit: Payer: Self-pay

## 2019-01-12 ENCOUNTER — Emergency Department (HOSPITAL_COMMUNITY): Payer: Medicare Other

## 2019-01-12 ENCOUNTER — Encounter (HOSPITAL_COMMUNITY): Payer: Self-pay

## 2019-01-12 ENCOUNTER — Inpatient Hospital Stay (HOSPITAL_COMMUNITY)
Admission: EM | Admit: 2019-01-12 | Discharge: 2019-01-16 | DRG: 660 | Disposition: A | Discharge status: Home / Self Care | Payer: Medicare Other | Attending: Internal Medicine | Admitting: Internal Medicine

## 2019-01-12 DIAGNOSIS — F17210 Nicotine dependence, cigarettes, uncomplicated: Secondary | ICD-10-CM | POA: Diagnosis present

## 2019-01-12 DIAGNOSIS — F329 Major depressive disorder, single episode, unspecified: Secondary | ICD-10-CM | POA: Diagnosis present

## 2019-01-12 DIAGNOSIS — N12 Tubulo-interstitial nephritis, not specified as acute or chronic: Secondary | ICD-10-CM | POA: Diagnosis not present

## 2019-01-12 DIAGNOSIS — K219 Gastro-esophageal reflux disease without esophagitis: Secondary | ICD-10-CM | POA: Diagnosis present

## 2019-01-12 DIAGNOSIS — E872 Acidosis, unspecified: Secondary | ICD-10-CM | POA: Diagnosis present

## 2019-01-12 DIAGNOSIS — Z87442 Personal history of urinary calculi: Secondary | ICD-10-CM

## 2019-01-12 DIAGNOSIS — F10188 Alcohol abuse with other alcohol-induced disorder: Secondary | ICD-10-CM | POA: Diagnosis not present

## 2019-01-12 DIAGNOSIS — Z20828 Contact with and (suspected) exposure to other viral communicable diseases: Secondary | ICD-10-CM | POA: Diagnosis present

## 2019-01-12 DIAGNOSIS — I129 Hypertensive chronic kidney disease with stage 1 through stage 4 chronic kidney disease, or unspecified chronic kidney disease: Secondary | ICD-10-CM | POA: Diagnosis present

## 2019-01-12 DIAGNOSIS — G8929 Other chronic pain: Secondary | ICD-10-CM | POA: Diagnosis present

## 2019-01-12 DIAGNOSIS — D638 Anemia in other chronic diseases classified elsewhere: Secondary | ICD-10-CM | POA: Diagnosis present

## 2019-01-12 DIAGNOSIS — F1211 Cannabis abuse, in remission: Secondary | ICD-10-CM | POA: Diagnosis present

## 2019-01-12 DIAGNOSIS — F1910 Other psychoactive substance abuse, uncomplicated: Secondary | ICD-10-CM | POA: Diagnosis not present

## 2019-01-12 DIAGNOSIS — Z888 Allergy status to other drugs, medicaments and biological substances status: Secondary | ICD-10-CM | POA: Diagnosis not present

## 2019-01-12 DIAGNOSIS — F1021 Alcohol dependence, in remission: Secondary | ICD-10-CM | POA: Diagnosis present

## 2019-01-12 DIAGNOSIS — I959 Hypotension, unspecified: Secondary | ICD-10-CM | POA: Diagnosis present

## 2019-01-12 DIAGNOSIS — N10 Acute tubulo-interstitial nephritis: Principal | ICD-10-CM | POA: Diagnosis present

## 2019-01-12 DIAGNOSIS — Z833 Family history of diabetes mellitus: Secondary | ICD-10-CM

## 2019-01-12 DIAGNOSIS — F419 Anxiety disorder, unspecified: Secondary | ICD-10-CM | POA: Diagnosis present

## 2019-01-12 DIAGNOSIS — E876 Hypokalemia: Secondary | ICD-10-CM | POA: Diagnosis present

## 2019-01-12 DIAGNOSIS — F1920 Other psychoactive substance dependence, uncomplicated: Secondary | ICD-10-CM | POA: Diagnosis present

## 2019-01-12 DIAGNOSIS — A419 Sepsis, unspecified organism: Secondary | ICD-10-CM | POA: Diagnosis present

## 2019-01-12 DIAGNOSIS — M5430 Sciatica, unspecified side: Secondary | ICD-10-CM | POA: Diagnosis present

## 2019-01-12 DIAGNOSIS — G4700 Insomnia, unspecified: Secondary | ICD-10-CM | POA: Diagnosis present

## 2019-01-12 DIAGNOSIS — R109 Unspecified abdominal pain: Secondary | ICD-10-CM | POA: Diagnosis present

## 2019-01-12 DIAGNOSIS — E785 Hyperlipidemia, unspecified: Secondary | ICD-10-CM | POA: Diagnosis present

## 2019-01-12 DIAGNOSIS — F339 Major depressive disorder, recurrent, unspecified: Secondary | ICD-10-CM | POA: Diagnosis not present

## 2019-01-12 DIAGNOSIS — N390 Urinary tract infection, site not specified: Secondary | ICD-10-CM | POA: Diagnosis present

## 2019-01-12 DIAGNOSIS — K267 Chronic duodenal ulcer without hemorrhage or perforation: Secondary | ICD-10-CM | POA: Diagnosis present

## 2019-01-12 DIAGNOSIS — R1013 Epigastric pain: Secondary | ICD-10-CM | POA: Diagnosis not present

## 2019-01-12 DIAGNOSIS — J449 Chronic obstructive pulmonary disease, unspecified: Secondary | ICD-10-CM | POA: Diagnosis present

## 2019-01-12 DIAGNOSIS — Z79899 Other long term (current) drug therapy: Secondary | ICD-10-CM

## 2019-01-12 DIAGNOSIS — N184 Chronic kidney disease, stage 4 (severe): Secondary | ICD-10-CM | POA: Diagnosis present

## 2019-01-12 DIAGNOSIS — F1124 Opioid dependence with opioid-induced mood disorder: Secondary | ICD-10-CM | POA: Diagnosis present

## 2019-01-12 DIAGNOSIS — F1411 Cocaine abuse, in remission: Secondary | ICD-10-CM | POA: Diagnosis present

## 2019-01-12 DIAGNOSIS — N179 Acute kidney failure, unspecified: Secondary | ICD-10-CM | POA: Diagnosis present

## 2019-01-12 DIAGNOSIS — F1994 Other psychoactive substance use, unspecified with psychoactive substance-induced mood disorder: Secondary | ICD-10-CM | POA: Diagnosis not present

## 2019-01-12 DIAGNOSIS — E8729 Other acidosis: Secondary | ICD-10-CM | POA: Diagnosis present

## 2019-01-12 LAB — BASIC METABOLIC PANEL
Anion gap: 11 (ref 5–15)
BUN: 35 mg/dL — ABNORMAL HIGH (ref 6–20)
CO2: 18 mmol/L — ABNORMAL LOW (ref 22–32)
Calcium: 8.3 mg/dL — ABNORMAL LOW (ref 8.9–10.3)
Chloride: 106 mmol/L (ref 98–111)
Creatinine, Ser: 2.82 mg/dL — ABNORMAL HIGH (ref 0.44–1.00)
GFR calc Af Amer: 21 mL/min — ABNORMAL LOW (ref >60)
GFR calc non Af Amer: 18 mL/min — ABNORMAL LOW (ref >60)
Glucose, Bld: 143 mg/dL — ABNORMAL HIGH (ref 70–99)
Potassium: 2.9 mmol/L — ABNORMAL LOW (ref 3.5–5.1)
Sodium: 135 mmol/L (ref 135–145)

## 2019-01-12 LAB — LACTIC ACID, PLASMA
Lactic Acid, Venous: 1.1 mmol/L (ref 0.5–1.9)
Lactic Acid, Venous: 1.3 mmol/L (ref 0.5–1.9)

## 2019-01-12 LAB — MAGNESIUM: Magnesium: 1.2 mg/dL — ABNORMAL LOW (ref 1.7–2.4)

## 2019-01-12 LAB — SARS CORONAVIRUS 2 (TAT 6-24 HRS): SARS Coronavirus 2: NEGATIVE

## 2019-01-12 LAB — URINALYSIS, ROUTINE W REFLEX MICROSCOPIC
Bilirubin Urine: NEGATIVE
Glucose, UA: NEGATIVE mg/dL
Ketones, ur: NEGATIVE mg/dL
Nitrite: POSITIVE — AB
Protein, ur: 100 mg/dL — AB
Specific Gravity, Urine: 1.013 (ref 1.005–1.030)
WBC, UA: >50 WBC/hpf — ABNORMAL HIGH (ref 0–5)
pH: 7 (ref 5.0–8.0)

## 2019-01-12 LAB — URINE CULTURE

## 2019-01-12 LAB — CBC WITH DIFFERENTIAL/PLATELET
Abs Immature Granulocytes: 0.07 10*3/uL (ref 0.00–0.07)
Basophils Absolute: 0 10*3/uL (ref 0.0–0.1)
Basophils Relative: 0 %
Eosinophils Absolute: 0 10*3/uL (ref 0.0–0.5)
Eosinophils Relative: 0 %
HCT: 27.1 % — ABNORMAL LOW (ref 36.0–46.0)
Hemoglobin: 8.3 g/dL — ABNORMAL LOW (ref 12.0–15.0)
Immature Granulocytes: 1 %
Lymphocytes Relative: 4 %
Lymphs Abs: 0.5 10*3/uL — ABNORMAL LOW (ref 0.7–4.0)
MCH: 30 pg (ref 26.0–34.0)
MCHC: 30.6 g/dL (ref 30.0–36.0)
MCV: 97.8 fL (ref 80.0–100.0)
Monocytes Absolute: 0.6 10*3/uL (ref 0.1–1.0)
Monocytes Relative: 6 %
Neutro Abs: 10 10*3/uL — ABNORMAL HIGH (ref 1.7–7.7)
Neutrophils Relative %: 89 %
Platelets: 340 10*3/uL (ref 150–400)
RBC: 2.77 MIL/uL — ABNORMAL LOW (ref 3.87–5.11)
RDW: 15.8 % — ABNORMAL HIGH (ref 11.5–15.5)
WBC: 11.2 10*3/uL — ABNORMAL HIGH (ref 4.0–10.5)
nRBC: 0 % (ref 0.0–0.2)

## 2019-01-12 LAB — COMPREHENSIVE METABOLIC PANEL
ALT: 16 U/L (ref 0–44)
AST: 20 U/L (ref 15–41)
Albumin: 2.7 g/dL — ABNORMAL LOW (ref 3.5–5.0)
Alkaline Phosphatase: 38 U/L (ref 38–126)
Anion gap: 10 (ref 5–15)
BUN: 41 mg/dL — ABNORMAL HIGH (ref 6–20)
CO2: 13 mmol/L — ABNORMAL LOW (ref 22–32)
Calcium: 8.8 mg/dL — ABNORMAL LOW (ref 8.9–10.3)
Chloride: 113 mmol/L — ABNORMAL HIGH (ref 98–111)
Creatinine, Ser: 3.28 mg/dL — ABNORMAL HIGH (ref 0.44–1.00)
GFR calc Af Amer: 17 mL/min — ABNORMAL LOW (ref >60)
GFR calc non Af Amer: 15 mL/min — ABNORMAL LOW (ref >60)
Glucose, Bld: 103 mg/dL — ABNORMAL HIGH (ref 70–99)
Potassium: 3.2 mmol/L — ABNORMAL LOW (ref 3.5–5.1)
Sodium: 136 mmol/L (ref 135–145)
Total Bilirubin: 0.3 mg/dL (ref 0.3–1.2)
Total Protein: 6.6 g/dL (ref 6.5–8.1)

## 2019-01-12 LAB — PHOSPHORUS: Phosphorus: 2.3 mg/dL — ABNORMAL LOW (ref 2.5–4.6)

## 2019-01-12 LAB — C DIFFICILE QUICK SCREEN W PCR REFLEX
C Diff antigen: NEGATIVE
C Diff interpretation: NOT DETECTED
C Diff toxin: NEGATIVE

## 2019-01-12 LAB — I-STAT BETA HCG BLOOD, ED (MC, WL, AP ONLY): I-stat hCG, quantitative: 5.7 m[IU]/mL — ABNORMAL HIGH (ref <5)

## 2019-01-12 LAB — LIPASE, BLOOD: Lipase: 23 U/L (ref 11–51)

## 2019-01-12 MED ORDER — NICOTINE 14 MG/24HR TD PT24
14.0000 mg | MEDICATED_PATCH | Freq: Every day | TRANSDERMAL | Status: DC
  Administered 2019-01-12 – 2019-01-16 (×5): 14 mg via TRANSDERMAL
  Filled 2019-01-12 (×5): qty 1

## 2019-01-12 MED ORDER — VITAMIN B-1 100 MG PO TABS
100.0000 mg | ORAL_TABLET | Freq: Every day | ORAL | Status: DC
  Administered 2019-01-12 – 2019-01-16 (×5): 100 mg via ORAL
  Filled 2019-01-12 (×5): qty 1

## 2019-01-12 MED ORDER — FENTANYL CITRATE (PF) 100 MCG/2ML IJ SOLN
50.0000 ug | Freq: Once | INTRAMUSCULAR | Status: AC
  Administered 2019-01-12: 50 ug via INTRAVENOUS
  Filled 2019-01-12: qty 2

## 2019-01-12 MED ORDER — SODIUM CHLORIDE 0.9 % IV SOLN
1.0000 g | Freq: Every day | INTRAVENOUS | Status: DC
  Administered 2019-01-13 – 2019-01-16 (×4): 1 g via INTRAVENOUS
  Filled 2019-01-12 (×4): qty 1

## 2019-01-12 MED ORDER — STERILE WATER FOR INJECTION IV SOLN
INTRAVENOUS | Status: DC
  Administered 2019-01-12 – 2019-01-15 (×9): via INTRAVENOUS
  Filled 2019-01-12 (×10): qty 850

## 2019-01-12 MED ORDER — FOLIC ACID 1 MG PO TABS
1.0000 mg | ORAL_TABLET | Freq: Every day | ORAL | Status: DC
  Administered 2019-01-12 – 2019-01-16 (×5): 1 mg via ORAL
  Filled 2019-01-12 (×5): qty 1

## 2019-01-12 MED ORDER — SODIUM CHLORIDE 0.9 % IV BOLUS
2000.0000 mL | Freq: Once | INTRAVENOUS | Status: AC
  Administered 2019-01-12: 2000 mL via INTRAVENOUS

## 2019-01-12 MED ORDER — MORPHINE SULFATE (PF) 2 MG/ML IV SOLN
2.0000 mg | INTRAVENOUS | Status: DC | PRN
  Administered 2019-01-12 – 2019-01-16 (×18): 2 mg via INTRAVENOUS
  Filled 2019-01-12 (×18): qty 1

## 2019-01-12 MED ORDER — LORAZEPAM 2 MG/ML IJ SOLN: 1.0000 mg | INTRAMUSCULAR | Status: AC | PRN

## 2019-01-12 MED ORDER — SODIUM CHLORIDE 0.9 % IV BOLUS
1000.0000 mL | Freq: Once | INTRAVENOUS | Status: DC
  Administered 2019-01-12: 1000 mL via INTRAVENOUS

## 2019-01-12 MED ORDER — POTASSIUM CHLORIDE CRYS ER 20 MEQ PO TBCR
40.0000 meq | EXTENDED_RELEASE_TABLET | Freq: Once | ORAL | Status: AC
  Administered 2019-01-12: 40 meq via ORAL
  Filled 2019-01-12: qty 2

## 2019-01-12 MED ORDER — ACETAMINOPHEN 650 MG RE SUPP: 650.0000 mg | Freq: Four times a day (QID) | RECTAL | Status: DC | PRN

## 2019-01-12 MED ORDER — SUCRALFATE 1 G PO TABS
1.0000 g | ORAL_TABLET | Freq: Three times a day (TID) | ORAL | Status: DC
  Administered 2019-01-12 – 2019-01-13 (×4): 1 g via ORAL
  Filled 2019-01-12 (×4): qty 1

## 2019-01-12 MED ORDER — ONDANSETRON HCL 4 MG PO TABS: 4.0000 mg | ORAL_TABLET | Freq: Four times a day (QID) | ORAL | Status: DC | PRN

## 2019-01-12 MED ORDER — LORAZEPAM 1 MG PO TABS: 1.0000 mg | ORAL_TABLET | ORAL | Status: AC | PRN

## 2019-01-12 MED ORDER — PANTOPRAZOLE SODIUM 40 MG IV SOLR
40.0000 mg | Freq: Once | INTRAVENOUS | Status: AC
  Administered 2019-01-12: 40 mg via INTRAVENOUS
  Filled 2019-01-12: qty 40

## 2019-01-12 MED ORDER — TAMSULOSIN HCL 0.4 MG PO CAPS
0.4000 mg | ORAL_CAPSULE | Freq: Every day | ORAL | Status: DC
  Administered 2019-01-12 – 2019-01-16 (×5): 0.4 mg via ORAL
  Filled 2019-01-12 (×5): qty 1

## 2019-01-12 MED ORDER — HEPARIN SODIUM (PORCINE) 5000 UNIT/ML IJ SOLN
5000.0000 [IU] | Freq: Three times a day (TID) | INTRAMUSCULAR | Status: DC
  Administered 2019-01-12 – 2019-01-16 (×11): 5000 [IU] via SUBCUTANEOUS
  Filled 2019-01-12 (×13): qty 1

## 2019-01-12 MED ORDER — ACETAMINOPHEN 325 MG PO TABS
650.0000 mg | ORAL_TABLET | Freq: Four times a day (QID) | ORAL | Status: DC | PRN
  Administered 2019-01-13 – 2019-01-16 (×3): 650 mg via ORAL
  Filled 2019-01-12 (×4): qty 2

## 2019-01-12 MED ORDER — METOCLOPRAMIDE HCL 5 MG/ML IJ SOLN
10.0000 mg | Freq: Once | INTRAMUSCULAR | Status: AC
  Administered 2019-01-12: 10 mg via INTRAVENOUS
  Filled 2019-01-12: qty 2

## 2019-01-12 MED ORDER — STERILE WATER FOR INJECTION IJ SOLN
INTRAMUSCULAR | Status: AC
  Administered 2019-01-12: 04:00:00
  Filled 2019-01-12: qty 10

## 2019-01-12 MED ORDER — SODIUM CHLORIDE 0.9 % IV SOLN
1.0000 g | Freq: Once | INTRAVENOUS | Status: AC
  Administered 2019-01-12: 1 g via INTRAVENOUS
  Filled 2019-01-12: qty 10

## 2019-01-12 MED ORDER — POTASSIUM CHLORIDE 20 MEQ PO PACK
40.0000 meq | PACK | Freq: Once | ORAL | Status: AC
  Administered 2019-01-12: 40 meq via ORAL
  Filled 2019-01-12: qty 2

## 2019-01-12 MED ORDER — ONDANSETRON HCL 4 MG/2ML IJ SOLN: 4.0000 mg | Freq: Four times a day (QID) | INTRAMUSCULAR | Status: DC | PRN

## 2019-01-12 MED ORDER — ADULT MULTIVITAMIN W/MINERALS CH
1.0000 | ORAL_TABLET | Freq: Every day | ORAL | Status: DC
  Administered 2019-01-12 – 2019-01-16 (×5): 1 via ORAL
  Filled 2019-01-12 (×5): qty 1

## 2019-01-12 MED ORDER — THIAMINE HCL 100 MG/ML IJ SOLN: 100.0000 mg | Freq: Every day | INTRAMUSCULAR | Status: DC

## 2019-01-12 NOTE — ED Triage Notes (Signed)
Pt presents to the ED via [facility] coming from home. Pt was seen last week in the ED for abdominal pain and dx with GI bleeding per EMS. Pt presents to ED today for similar symptoms as well as flank pain and UTI like symptoms for the last two days.
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
## 2019-01-12 NOTE — H&P (Signed)
History and Physical    [name] ENI:[id number] DOB: Aug 26, 1960 DOA: 01/12/2019  PCP: Ladell Pier, MD  Patient coming from: Home  I have personally briefly reviewed patient's old medical records in [place]  Chief Complaint: Flank pain  HPI: [name] is a 58 y.o. female with medical history significant of polysubstance (Cocaine, EtOH, and THC) abuse, HTN, anxiety and depression.  She was recently admitted for UGI bleed secondary to gastritis and duodenal ulcers; discharged 10 days ago.  States that she has been compliant with her medications since discharge.  Has avoided NSAIDs.  She had chest and back pain during this admission.  States that her chest and back pain improved while in the hospital, but never fully subsided.  She is noting ongoing pain in her back which radiates down her legs.  She does have a history of chronic back pain with sciatica.    Yesterday she developed increasing N/V, NBNB diarrhea.  Worsening flank pain and dysuria.  Concerned she may have a UTI.   ED Course: Tm 100.1, initially tachy to 110 and hypotensive to 76/57 though this rapidly resolves with IVF.  Creat 3.2 up from 2.5 on 10/16 which in turn is up from 2.1 earlier this year and 1.5 in Aug 2019.  Found to have UTI and started on rocephin.   Review of Systems: As per HPI, otherwise all review of systems negative.  Past Medical History:  Diagnosis Date  . Alcohol abuse   . Allergy   . Anxiety   . Arthritis   . Asthma   . Cancer ([place])   . Cannabis abuse   . Cocaine abuse ([place])   . Depression   . Drug addiction ([place])   . GERD (gastroesophageal reflux disease)   . Homelessness   . Hypertension    pt stated "every once in a while BP will be high but has not been prescribed medication for HTN.   . Seasonal allergies   . Secondary diabetes mellitus with stage 3 chronic kidney disease (GFR 30-59) ([place]) 02/22/2016    Past Surgical History:  Procedure Laterality Date  .  BIOPSY  01/01/2019   Procedure: BIOPSY;  Surgeon: Jerene Bears, MD;  Location: [facility] END[facility]COPY;  Service: Endoscopy;;  . [place]  . CYSTOSCOPY W/ URETERAL STENT PLACEMENT Left 08/01/2018   Procedure: CYSTOSCOPY WITH RETROGRADE PYELOGRAM/URETERAL STENT PLACEMENT;  Surgeon: Lucas Mallow, MD;  Location: [facility] ORS;  Service: Urology;  Laterality: Left;  . ESOPHAGOGASTRODUODENOSCOPY (EGD) WITH PROPOFOL N/A 01/01/2019   Procedure: ESOPHAGOGASTRODUODENOSCOPY (EGD) WITH PROPOFOL;  Surgeon: Jerene Bears, MD;  Location: [facility] ENDOSCOPY;  Service: Endoscopy;  Laterality: N/A;  . FRACTURE SURGERY Left 2011     reports that she has been smoking cigarettes. She has been smoking about 1.00 pack per day for the past 0.00 years. She has never used smokeless tobacco. She reports current alcohol use of about 7.0 standard drinks of alcohol per week. She reports current drug use. Drugs: Cocaine and Marijuana.  Allergies  Allergen Reactions  . Lisinopril Other (See Comments)    hyperkalemia    Family History  Problem Relation Age of Onset  . Diabetes Father   . Colon cancer Neg Hx      Prior to Admission medications   Medication Sig Start Date End Date Taking? Authorizing Provider  albuterol (VENTOLIN HFA) 108 (90 Base) MCG/ACT inhaler INHALE 2 PUFFS INTO THE LUNGS EVERY 6 HOURS AS NEEDED FOR WHEEZING  OR SHORTNESS OF BREATH. 01/02/19  Yes Bonnell Public, MD  amLODipine (NORVASC) 10 MG tablet Take 1 tablet (10 mg total) by mouth daily. 01/02/19  Yes Dana Allan I, MD  atorvastatin (LIPITOR) 10 MG tablet Take 0.5 tablets (5 mg total) by mouth daily. 01/02/19  Yes Dana Allan I, MD  carvedilol (COREG) 3.125 MG tablet Take 1 tablet (3.125 mg total) by mouth 2 (two) times daily with a meal. 01/02/19  Yes Bonnell Public, MD  cyanocobalamin 1000 MCG tablet Take 1 tablet (1,000 mcg total) by mouth daily. 01/02/19  Yes Dana Allan I, MD  ferrous sulfate 325 (65 FE) MG tablet  Take 1 tablet (325 mg total) by mouth 2 (two) times daily with a meal. 01/02/19  Yes Bonnell Public, MD  folic acid (FOLVITE) 1 MG tablet Take 1 tablet (1 mg total) by mouth daily. 01/03/19  Yes Bonnell Public, MD  Multiple Vitamin (MULTIVITAMIN WITH MINERALS) TABS tablet Take 1 tablet by mouth daily. 01/03/19  Yes Bonnell Public, MD  pantoprazole (PROTONIX) 40 MG tablet Take 1 tablet (40 mg total) by mouth 2 (two) times daily before a meal. 01/02/19  Yes Bonnell Public, MD  sucralfate (CARAFATE) 1 GM/10ML suspension Take 10 mLs (1 g total) by mouth 4 (four) times daily -  with meals and at bedtime. 01/02/19  Yes Dana Allan I, MD  thiamine 100 MG tablet Take 1 tablet (100 mg total) by mouth daily. 01/03/19  Yes Bonnell Public, MD    Physical Exam: Vitals:   01/12/19 0300 01/12/19 0330 01/12/19 0400 01/12/19 0430  BP: 105/64 113/68 113/60 99/61  Pulse: 88 96 93 93  Resp: (!) 21 (!) 27 (!) 25 (!) 24  Temp:      TempSrc:      SpO2: 99% 100% 100% 100%  Weight:      Height:        Constitutional: NAD, calm, comfortable Eyes: PERRL, lids and conjunctivae normal ENMT: Mucous membranes are moist. Posterior pharynx clear of any exudate or lesions.Normal dentition.  Neck: normal, supple, no masses, no thyromegaly Respiratory: clear to auscultation bilaterally, no wheezing, no crackles. Normal respiratory effort. No accessory muscle use.  Cardiovascular: Regular rate and rhythm, no murmurs / rubs / gallops. No extremity edema. 2+ pedal pulses. No carotid bruits.  Abdomen: no tenderness, no masses palpated. No hepatosplenomegaly. Bowel sounds positive.  Musculoskeletal: no clubbing / cyanosis. No joint deformity upper and lower extremities. Good ROM, no contractures. Normal muscle tone.  Skin: no rashes, lesions, ulcers. No induration Neurologic: CN 2-12 grossly intact. Sensation intact, DTR normal. Strength 5/5 in all 4.  Psychiatric: Normal judgment and insight.  Alert and oriented x 3. Normal mood.    Labs on Admission: I have personally reviewed following labs and imaging studies  CBC: Recent Labs  Lab 01/12/19 0214  WBC 11.2*  NEUTROABS 10.0*  HGB 8.3*  HCT 27.1*  MCV 97.8  PLT 811   Basic Metabolic Panel: Recent Labs  Lab 01/12/19 0214  NA 136  K 3.2*  CL 113*  CO2 13*  GLUCOSE 103*  BUN 41*  CREATININE 3.28*  CALCIUM 8.8*   GFR: Estimated Creatinine Clearance: 18.2 mL/min (A) (by C-G formula based on SCr of 3.28 mg/dL (H)). Liver Function Tests: Recent Labs  Lab 01/12/19 0214  AST 20  ALT 16  ALKPHOS 38  BILITOT 0.3  PROT 6.6  ALBUMIN 2.7*   Recent Labs  Lab 01/12/19 0214  LIPASE  23   No results for input(s): AMMONIA in the last 168 hours. Coagulation Profile: No results for input(s): INR, PROTIME in the last 168 hours. Cardiac Enzymes: No results for input(s): CKTOTAL, CKMB, CKMBINDEX, TROPONINI in the last 168 hours. BNP (last 3 results) No results for input(s): PROBNP in the last 8760 hours. HbA1C: No results for input(s): HGBA1C in the last 72 hours. CBG: No results for input(s): GLUCAP in the last 168 hours. Lipid Profile: No results for input(s): CHOL, HDL, LDLCALC, TRIG, CHOLHDL, LDLDIRECT in the last 72 hours. Thyroid Function Tests: No results for input(s): TSH, T4TOTAL, FREET4, T3FREE, THYROIDAB in the last 72 hours. Anemia Panel: No results for input(s): VITAMINB12, FOLATE, FERRITIN, TIBC, IRON, RETICCTPCT in the last 72 hours. Urine analysis:    Component Value Date/Time   COLORURINE AMBER (A) 01/12/2019 0214   APPEARANCEUR TURBID (A) 01/12/2019 0214   LABSPEC 1.013 01/12/2019 0214   PHURINE 7.0 01/12/2019 0214   GLUCOSEU NEGATIVE 01/12/2019 0214   HGBUR SMALL (A) 01/12/2019 0214   BILIRUBINUR NEGATIVE 01/12/2019 0214   KETONESUR NEGATIVE 01/12/2019 0214   PROTEINUR 100 (A) 01/12/2019 0214   UROBILINOGEN 0.2 03/02/2010 2027   NITRITE POSITIVE (A) 01/12/2019 0214   LEUKOCYTESUR  LARGE (A) 01/12/2019 0214    Radiological Exams on Admission: Dg Chest Port 1 View  Result Date: 01/12/2019 CLINICAL DATA:  58 year old female with sepsis. EXAM: PORTABLE CHEST 1 VIEW COMPARISON:  Chest radiograph dated 07/31/2018 FINDINGS: The heart size and mediastinal contours are within normal limits. Both lungs are clear. The visualized skeletal structures are unremarkable. IMPRESSION: No active disease. Electronically Signed   By: Anner Crete M.D.   On: 01/12/2019 01:46    EKG: Independently reviewed.  Assessment/Plan Principal Problem:   Acute pyelonephritis Active Problems:   History of cocaine abuse (HCC)   CKD (chronic kidney disease) stage 4, GFR 15-29 ml/min (HCC)   AKI (acute kidney injury) ([place])   Polysubstance abuse (HCC)   Metabolic acidosis with normal anion gap and failure of bicarbonate regeneration    1. Acute pyelonephritis - 1. Rocephin 2. UCx pending 3. US renal Bilateral to r/o pyohydronephrosis (also to r/o obstructive uropathy as cause for AKI). 2. AKI on CKD stage 4 - 1. Acute worsening of renal function likely due to hypotension from sepsis / pyelonephritis above 2. IVF: 2L bolus in ED + 125 cc/hr isotonic bicarb 3. Repeat BMP at 5pm and tomorrow AM 4. Renal US as above 5. May want to give nephrology a call for consult given the progressive CKD and apparent RTA 3. NAG metabolic acidosis - 1. 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 Isotonic bicarb as above with BMPs as above 3. Replace K 4. Polysubstance abuse - 1. CIWA 2. SW consult  DVT prophylaxis: Heparin [place] Code Status: Full Family Communication: No family in room Disposition Plan: Home after admit Consults called: None Admission status: Place in 52    , [place] [facility]  How to contact the [facility] Attending or Consulting provider [place] or covering provider during after hours [place], for this patient?  1. Check the care team in [facility] and look for a)  attending/consulting TR[facility] provider listed and b) the [facility] team listed 2. Log into [email or web address]  Amion Physician Scheduling and messaging for groups and whole hospitals  On call and physician scheduling software for group practices, residents, hospitalists and other medical providers for call, clinic, rotation and shift schedules. OnCall Enterprise is a hospital-wide system for scheduling  doctors and paging doctors on call. EasyPlot is for scientific plotting and data analysis.  [email or web address]  and use [place]'s universal password to access. If you do not have the password, please contact the hospital operator.  3. Locate the [facility] provider you are looking for under [facility] and page to a number that you can be directly reached. 4. If you still have difficulty reaching the provider, please page the [facility] (Director on Call) for the Hospitalists listed on amion for assistance.  01/12/2019, 4:55 AM

## 2019-01-12 NOTE — ED Notes (Signed)
Bedside commode put in pt room and she was instructed that we need a urine sample and stool sample. Pt will call out when she needs to use the restroom.  Call light within reach.

## 2019-01-12 NOTE — Consult Note (Signed)
H&P Physician requesting consult: Gu[place] Shi  Chief Complaint: Pyelonephritis, history of ureteral calculi   History of Present Illness: 58 year old female who underwent urgent ureteral stent placement by myself on 08/01/2018 for an obstructing left ureteral calculus and concern for urinary tract infection.  She failed to follow-up unfortunately.  She was a no-show to my clinic.  She presented to the emergency department with severe left-sided flank pain.  She was also developing nausea, vomiting, diarrhea, worsening flank pain and dysuria.  She had a low-grade fever of 100.1 and is tachycardic.  She was initially hypotensive.  She also has acute renal insufficiency with a creatinine increased to 3.2.  She had a CT of the abdomen and pelvis without contrast on 12/31/2018.  This revealed appropriately placed left ureteral stent.  There was no stone identified along the stent.  There was no hydronephrosis.  Renal ultrasound today showed a left ureteral stent in place with no hydronephrosis.  It was thought that the stent is possibly encrusted at the level of the bladder.  She also had right renal malrotation and evidence of medical renal disease with atrophy.  Past Medical History:  Diagnosis Date  . Alcohol abuse   . Allergy   . Anxiety   . Arthritis   . Asthma   . Cancer ([place])   . Cannabis abuse   . Cocaine abuse ([place])   . Depression   . Drug addiction ([place])   . GERD (gastroesophageal reflux disease)   . Homelessness   . Hypertension    pt stated "every once in a while BP will be high but has not been prescribed medication for HTN.   . Seasonal allergies   . 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 BIOPSY  01/01/2019   Procedure: BIOPSY;  Surgeon: Jerene Bears, MD;  Location: [facility] ENDOSCOPY;  Service: Endoscopy;;  . [place]  . CYSTOSCOPY W/ URETERAL STENT  PLACEMENT Left 08/01/2018   Procedure: CYSTOSCOPY WITH RETROGRADE PYELOGRAM/URETERAL STENT PLACEMENT;  Surgeon: Lucas Mallow, MD;  Location: [facility] ORS;  Service: Urology;  Laterality: Left;  . ESOPHAGOGASTRODUODENOSCOPY (EGD) WITH PROPOFOL N/A 01/01/2019   Procedure: ESOPHAGOGASTRODUODENOSCOPY (EGD) WITH PROPOFOL;  Surgeon: Jerene Bears, MD;  Location: [facility] ENDOSCOPY;  Service: Endoscopy;  Laterality: N/A;  . FRACTURE SURGERY Left 2011    Home Medications:  (Not in a hospital admission)  Allergies:  Allergies  Allergen Reactions  . Lisinopril Other (See Comments)    hyperkalemia    Family History  Problem Relation Age of Onset  . Diabetes Father   . Colon cancer Neg Hx    Social History:  reports that she has been smoking cigarettes. She has been smoking about 1.00 pack per day for the past 0.00 years. She has never used smokeless tobacco. She reports current alcohol use of about 7.0 standard drinks of alcohol per week. She reports current drug use. Drugs: Cocaine and Marijuana.  ROS: A complete review of systems was performed.  All systems are negative except for pertinent findings as noted. ROS   Physical Exam:  Vital signs in last 24 hours: Temp:  [100.1 F (37.8 C)] 100.1 F (37.8 C) (10/27 0109) Pulse Rate:  [88-111] 104 (10/27 1600) Resp:  [16-31] 20 (10/27 1600) BP: (76-160)/(56-109) 154/65 (10/27 1600) SpO2:  [95 %-100 %] 97 % (10/27 1600) Weight:  [64.4 kg] 64.4 kg (10/27 0102) General:  Alert and oriented, No acute distress HEENT: Normocephalic, atraumatic Neck: No JVD or lymphadenopathy Cardiovascular: Tachycardic Lungs: Regular rate and effort Abdomen: Soft, nontender, nondistended, no abdominal masses Back: No CVA tenderness Extremities: No edema Neurologic: Grossly intact  Laboratory Data:  Results for orders placed or performed during the hospital encounter of 01/12/19 (from the past 24 hour(s))  SARS CORONAVIRUS 2 (TAT 6-24 HRS) Nasopharyngeal  Nasopharyngeal Swab     Status: None   Collection Time: 01/12/19  1:35 AM   Specimen: Nasopharyngeal Swab  Result Value Ref Range   SARS Coronavirus 2 NEGATIVE NEGATIVE  Urinalysis, Routine w reflex microscopic     Status: Abnormal   Collection Time: 01/12/19  2:14 AM  Result Value Ref Range   Color, Urine AMBER (A) YELLOW   APPearance TURBID (A) CLEAR   Specific Gravity, Urine 1.013 1.005 - 1.030   pH 7.0 5.0 - 8.0   Glucose, UA NEGATIVE NEGATIVE mg/dL   Hgb urine dipstick SMALL (A) NEGATIVE   Bilirubin Urine NEGATIVE NEGATIVE   Ketones, ur NEGATIVE NEGATIVE mg/dL   Protein, ur 100 (A) NEGATIVE mg/dL   Nitrite POSITIVE (A) NEGATIVE   Leukocytes,Ua LARGE (A) NEGATIVE   RBC / HPF 21-50 0 - 5 RBC/hpf   WBC, UA >50 (H) 0 - 5 WBC/hpf   Bacteria, UA MANY (A) NONE SEEN   Squamous Epithelial / LPF 11-20 0 - 5   WBC Clumps PRESENT    Mucus PRESENT   CBC with Differential     Status: Abnormal   Collection Time: 01/12/19  2:14 AM  Result Value Ref Range   WBC 11.2 (H) 4.0 - 10.5 K/uL   RBC 2.77 (L) 3.87 - 5.11 MIL/uL   Hemoglobin 8.3 (L) 12.0 - 15.0 g/dL   HCT 27.1 (L) 36.0 - 46.0 %   MCV 97.8 80.0 - 100.0 fL   MCH 30.0 26.0 - 34.0 pg   MCHC 30.6 30.0 - 36.0 g/dL   RDW 15.8 (H) 11.5 - 15.5 %   Platelets 340 150 - 400 K/uL   nRBC 0.0 0.0 - 0.2 %   Neutrophils Relative % 89 %   Neutro Abs 10.0 (H) 1.7 - 7.7 K/uL   Lymphocytes Relative 4 %   Lymphs Abs 0.5 (L) 0.7 - 4.0 K/uL   Monocytes Relative 6 %   Monocytes Absolute 0.6 0.1 - 1.0 K/uL   Eosinophils Relative 0 %   Eosinophils Absolute 0.0 0.0 - 0.5 K/uL   Basophils Relative 0 %   Basophils Absolute 0.0 0.0 - 0.1 K/uL   WBC Morphology DOHLE BODIES    Immature Granulocytes 1 %   Abs Immature Granulocytes 0.07 0.00 - 0.07 K/uL  Lactic acid, plasma     Status: None   Collection Time: 01/12/19  2:14 AM  Result Value Ref Range   Lactic Acid, Venous 1.1 0.5 - 1.9 mmol/L  Lactic acid, plasma     Status: None   Collection Time:  01/12/19  2:14 AM  Result Value Ref Range   Lactic Acid, Venous 1.3 0.5 - 1.9 mmol/L  Culture, blood (Routine x 2)     Status: None (Preliminary result)   Collection Time: 01/12/19  2:14 AM   Specimen: BLOOD  Result Value Ref Range   Specimen Description      BLOOD RIGHT ANTECUBITAL Performed at [facility], [place] [address] [place], [place] [address]    Special Requests      BOTTLES DRAWN AEROBIC AND ANAEROBIC Blood  Culture results may not be optimal due to an inadequate volume of blood received in culture bottles Performed at [facility] [address] [place], [place] [address]    Culture      NO GROWTH < 12 HOURS Performed at [facility] [address], [place], [place] [address]    Report Status PENDING   Culture, blood (Routine x 2)     Status: None (Preliminary result)   Collection Time: 01/12/19  2:14 AM   Specimen: BLOOD  Result Value Ref Range   Specimen Description      BLOOD LEFT WRIST Performed at [facility] [address] [place], [place] [address]    Special Requests      BOTTLES DRAWN AEROBIC AND ANAEROBIC Blood Culture adequate volume Performed at [facility] [address], [place], [place] [address]    Culture      NO GROWTH < 12 HOURS Performed at [facility] [address], [place], [place] [address]    Report Status PENDING   Comprehensive metabolic panel     Status: Abnormal   Collection Time: 01/12/19  2:14 AM  Result Value Ref Range   Sodium 136 135 - 145 mmol/L   Potassium 3.2 (L) 3.5 - 5.1 mmol/L   Chloride 113 (H) 98 - 111 mmol/L   CO2 13 (L) 22 - 32 mmol/L   Glucose, Bld 103 (H) 70 - 99 mg/dL   BUN 41 (H) 6 - 20 mg/dL   Creatinine, Ser 3.28 (H) 0.44 - 1.00 mg/dL   Calcium 8.8 (L) 8.9 - 10.3 mg/dL   Total Protein 6.6 6.5 - 8.1 g/dL   Albumin 2.7 (L) 3.5 - 5.0 g/dL   AST 20 15 - 41 U/L   ALT 16 0 - 44 U/L   Alkaline Phosphatase 38 38 - 126  U/L   Total Bilirubin 0.3 0.3 - 1.2 mg/dL   GFR calc non Af Amer 15 (L) >60 mL/min   GFR calc Af Amer 17 (L) >60 mL/min   Anion gap 10 5 - 15  Lipase, blood     Status: None   Collection Time: 01/12/19  2:14 AM  Result Value Ref Range   Lipase 23 11 - 51 U/L  C difficile quick scan w PCR reflex     Status: None   Collection Time: 01/12/19  2:14 AM   Specimen: Urine, Clean Catch; Stool  Result Value Ref Range   C Diff antigen NEGATIVE NEGATIVE   C Diff toxin NEGATIVE NEGATIVE   C Diff interpretation No C. difficile detected.   I-Stat beta hCG blood, ED     Status: Abnormal   Collection Time: 01/12/19  2:18 AM  Result Value Ref Range   I-stat hCG, quantitative 5.7 (H) <5 mIU/mL   Comment 3          Magnesium     Status: Abnormal   Collection Time: 01/12/19  6:00 AM  Result Value Ref Range   Magnesium 1.2 (L) 1.7 - 2.4 mg/dL  Phosphorus     Status: Abnormal   Collection Time: 01/12/19  6:00 AM  Result Value Ref Range   Phosphorus 2.3 (L) 2.5 - 4.6 mg/dL   Recent Results (from the past 240 hour(s))  SARS CORONAVIRUS 2 (TAT 6-24 HRS) Nasopharyngeal Nasopharyngeal Swab     Status: None   Collection Time: 01/12/19  1:35 AM   Specimen: Nasopharyngeal Swab  Result Value Ref  Range Status   SARS Coronavirus 2 NEGATIVE NEGATIVE Final    Comment: (NOTE) SARS-CoV-2 target nucleic acids are NOT DETECTED. The SARS-CoV-2 RNA is generally detectable in upper and lower respiratory specimens during the acute phase of infection. Negative results do not preclude SARS-CoV-2 infection, do not rule out co-infections with other pathogens, and should not be used as the sole basis for treatment or other patient management decisions. Negative results must be combined with clinical observations, patient history, and epidemiological information. The expected result is Negative. Fact Sheet for Patients: SugarRoll.be Fact Sheet for Healthcare  Providers: https://www.woods-mathews.com/ This test is not yet approved or cleared by the [place] FDA and  has been authorized for detection and/or diagnosis of SARS-CoV-2 by FDA under an Emergency Use Authorization (EUA). This EUA will remain  in effect (meaning this test can be used) for the duration of the COVID-19 declaration under Section 56 4(b)(1) of the Act, 21 U.S.C. section 360bbb-3(b)(1), unless the authorization is terminated or revoked sooner. Performed at [facility], [place] [address], [place], [place] [address]   Culture, blood (Routine x 2)     Status: None (Preliminary result)   Collection Time: 01/12/19  2:14 AM   Specimen: BLOOD  Result Value Ref Range Status   Specimen Description   Final    BLOOD RIGHT ANTECUBITAL Performed at [facility] [address] [place], [place] [address]    Special Requests   Final    BOTTLES DRAWN AEROBIC AND ANAEROBIC Blood Culture results may not be optimal due to an inadequate volume of blood received in culture bottles Performed at [facility] [address], [place], [place] [address]    Culture   Final    NO GROWTH < 12 HOURS Performed at [facility] [address] [place], [place] [address]    Report Status PENDING  Incomplete  Culture, blood (Routine x 2)     Status: None (Preliminary result)   Collection Time: 01/12/19  2:14 AM   Specimen: BLOOD  Result Value Ref Range Status   Specimen Description   Final    BLOOD LEFT WRIST Performed at [facility] [address] [place], [place] [address]    Special Requests   Final    BOTTLES DRAWN AEROBIC AND ANAEROBIC Blood Culture adequate volume Performed at [facility] [address], [place], [place] [address]    Culture   Final    NO GROWTH < 12 HOURS Performed at [facility] [address], [place], [place] [address]    Report Status PENDING   Incomplete  C difficile quick scan w PCR reflex     Status: None   Collection Time: 01/12/19  2:14 AM   Specimen: Urine, Clean Catch; Stool  Result Value Ref Range Status   C Diff antigen NEGATIVE NEGATIVE Final   C Diff toxin NEGATIVE NEGATIVE Final   C Diff interpretation No C. difficile detected.  Final    Comment: Performed at [facility], [place] [address], [place], [place] [address]   Creatinine: Recent Labs    01/12/19 0214  CREATININE 3.28*   CT scan personally reviewed and is detailed in history of present illness.  Impression/Assessment:  Retained left ureteral stent Left renal calculi Pyelonephritis  Plan:  Agree with admission and IV antibiotics.  I am concerned that if she is discharged she would fail to follow-up which would worsen the encrustation  of the stent.  Therefore, I discussed the option of IV antibiotics until Friday afternoon at which point we will plan for cystoscopy with left ureteroscopy, laser lithotripsy, ureteral stent exchange.  She understands potential for bleeding, infection, injury to surrounding structures, need for additional procedures.  I will likely leave a string on the stent that is exchanged in the event that she is lost to follow-up.  Marton Redwood, III 01/12/2019, 5:20 PM

## 2019-01-12 NOTE — ED Notes (Signed)
Report called to Jessica RN

## 2019-01-12 NOTE — H&P (View-Only) (Signed)
H&P Physician requesting consult: Guilford Shi  Chief Complaint: Pyelonephritis, history of ureteral calculi   History of Present Illness: 58 year old female who underwent urgent ureteral stent placement by myself on 08/01/2018 for an obstructing left ureteral calculus and concern for urinary tract infection.  She failed to follow-up unfortunately.  She was a no-show to my clinic.  She presented to the emergency department with severe left-sided flank pain.  She was also developing nausea, vomiting, diarrhea, worsening flank pain and dysuria.  She had a low-grade fever of 100.1 and is tachycardic.  She was initially hypotensive.  She also has acute renal insufficiency with a creatinine increased to 3.2.  She had a CT of the abdomen and pelvis without contrast on 12/31/2018.  This revealed appropriately placed left ureteral stent.  There was no stone identified along the stent.  There was no hydronephrosis.  Renal ultrasound today showed a left ureteral stent in place with no hydronephrosis.  It was thought that the stent is possibly encrusted at the level of the bladder.  She also had right renal malrotation and evidence of medical renal disease with atrophy.  Past Medical History:  Diagnosis Date  . Alcohol abuse   . Allergy   . Anxiety   . Arthritis   . Asthma   . Cancer ([place])   . Cannabis abuse   . Cocaine abuse ([place])   . Depression   . Drug addiction ([place])   . GERD (gastroesophageal reflux disease)   . Homelessness   . Hypertension    pt stated "every once in a while BP will be high but has not been prescribed medication for HTN.   . Seasonal allergies   . 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 BIOPSY  01/01/2019   Procedure: BIOPSY;  Surgeon: Jerene Bears, MD;  Location: [facility] ENDOSCOPY;  Service: Endoscopy;;  . [place]  . CYSTOSCOPY W/ URETERAL STENT  PLACEMENT Left 08/01/2018   Procedure: CYSTOSCOPY WITH RETROGRADE PYELOGRAM/URETERAL STENT PLACEMENT;  Surgeon: Lucas Mallow, MD;  Location: [facility] ORS;  Service: Urology;  Laterality: Left;  . ESOPHAGOGASTRODUODENOSCOPY (EGD) WITH PROPOFOL N/A 01/01/2019   Procedure: ESOPHAGOGASTRODUODENOSCOPY (EGD) WITH PROPOFOL;  Surgeon: Jerene Bears, MD;  Location: [facility] ENDOSCOPY;  Service: Endoscopy;  Laterality: N/A;  . FRACTURE SURGERY Left 2011    Home Medications:  (Not in a hospital admission)  Allergies:  Allergies  Allergen Reactions  . Lisinopril Other (See Comments)    hyperkalemia    Family History  Problem Relation Age of Onset  . Diabetes Father   . Colon cancer Neg Hx    Social History:  reports that she has been smoking cigarettes. She has been smoking about 1.00 pack per day for the past 0.00 years. She has never used smokeless tobacco. She reports current alcohol use of about 7.0 standard drinks of alcohol per week. She reports current drug use. Drugs: Cocaine and Marijuana.  ROS: A complete review of systems was performed.  All systems are negative except for pertinent findings as noted. ROS   Physical Exam:  Vital signs in last 24 hours: Temp:  [100.1 F (37.8 C)] 100.1 F (37.8 C) (10/27 0109) Pulse Rate:  [88-111] 104 (10/27 1600) Resp:  [16-31] 20 (10/27 1600) BP: (76-160)/(56-109) 154/65 (10/27 1600) SpO2:  [95 %-100 %] 97 % (10/27 1600) Weight:  [64.4 kg] 64.4 kg (10/27 0102) General:  Alert and oriented, No acute distress HEENT: Normocephalic, atraumatic Neck: No JVD or lymphadenopathy Cardiovascular: Tachycardic Lungs: Regular rate and effort Abdomen: Soft, nontender, nondistended, no abdominal masses Back: No CVA tenderness Extremities: No edema Neurologic: Grossly intact  Laboratory Data:  Results for orders placed or performed during the hospital encounter of 01/12/19 (from the past 24 hour(s))  SARS CORONAVIRUS 2 (TAT 6-24 HRS) Nasopharyngeal  Nasopharyngeal Swab     Status: None   Collection Time: 01/12/19  1:35 AM   Specimen: Nasopharyngeal Swab  Result Value Ref Range   SARS Coronavirus 2 NEGATIVE NEGATIVE  Urinalysis, Routine w reflex microscopic     Status: Abnormal   Collection Time: 01/12/19  2:14 AM  Result Value Ref Range   Color, Urine AMBER (A) YELLOW   APPearance TURBID (A) CLEAR   Specific Gravity, Urine 1.013 1.005 - 1.030   pH 7.0 5.0 - 8.0   Glucose, UA NEGATIVE NEGATIVE mg/dL   Hgb urine dipstick SMALL (A) NEGATIVE   Bilirubin Urine NEGATIVE NEGATIVE   Ketones, ur NEGATIVE NEGATIVE mg/dL   Protein, ur 100 (A) NEGATIVE mg/dL   Nitrite POSITIVE (A) NEGATIVE   Leukocytes,Ua LARGE (A) NEGATIVE   RBC / HPF 21-50 0 - 5 RBC/hpf   WBC, UA >50 (H) 0 - 5 WBC/hpf   Bacteria, UA MANY (A) NONE SEEN   Squamous Epithelial / LPF 11-20 0 - 5   WBC Clumps PRESENT    Mucus PRESENT   CBC with Differential     Status: Abnormal   Collection Time: 01/12/19  2:14 AM  Result Value Ref Range   WBC 11.2 (H) 4.0 - 10.5 K/uL   RBC 2.77 (L) 3.87 - 5.11 MIL/uL   Hemoglobin 8.3 (L) 12.0 - 15.0 g/dL   HCT 27.1 (L) 36.0 - 46.0 %   MCV 97.8 80.0 - 100.0 fL   MCH 30.0 26.0 - 34.0 pg   MCHC 30.6 30.0 - 36.0 g/dL   RDW 15.8 (H) 11.5 - 15.5 %   Platelets 340 150 - 400 K/uL   nRBC 0.0 0.0 - 0.2 %   Neutrophils Relative % 89 %   Neutro Abs 10.0 (H) 1.7 - 7.7 K/uL   Lymphocytes Relative 4 %   Lymphs Abs 0.5 (L) 0.7 - 4.0 K/uL   Monocytes Relative 6 %   Monocytes Absolute 0.6 0.1 - 1.0 K/uL   Eosinophils Relative 0 %   Eosinophils Absolute 0.0 0.0 - 0.5 K/uL   Basophils Relative 0 %   Basophils Absolute 0.0 0.0 - 0.1 K/uL   WBC Morphology DOHLE BODIES    Immature Granulocytes 1 %   Abs Immature Granulocytes 0.07 0.00 - 0.07 K/uL  Lactic acid, plasma     Status: None   Collection Time: 01/12/19  2:14 AM  Result Value Ref Range   Lactic Acid, Venous 1.1 0.5 - 1.9 mmol/L  Lactic acid, plasma     Status: None   Collection Time:  01/12/19  2:14 AM  Result Value Ref Range   Lactic Acid, Venous 1.3 0.5 - 1.9 mmol/L  Culture, blood (Routine x 2)     Status: None (Preliminary result)   Collection Time: 01/12/19  2:14 AM   Specimen: BLOOD  Result Value Ref Range   Specimen Description      BLOOD RIGHT ANTECUBITAL Performed at [facility], [place] [address] [place], [place] [address]    Special Requests      BOTTLES DRAWN AEROBIC AND ANAEROBIC Blood  Culture results may not be optimal due to an inadequate volume of blood received in culture bottles Performed at [facility] [address] [place], [place] [address]    Culture      NO GROWTH < 12 HOURS Performed at [facility] [address] [place], [place] [address]    Report Status PENDING   Culture, blood (Routine x 2)     Status: None (Preliminary result)   Collection Time: 01/12/19  2:14 AM   Specimen: BLOOD  Result Value Ref Range   Specimen Description      BLOOD LEFT WRIST Performed at [facility] [address], [place], [place] [address]    Special Requests      BOTTLES DRAWN AEROBIC AND ANAEROBIC Blood Culture adequate volume Performed at [facility] [address], [place], [place] [address]    Culture      NO GROWTH < 12 HOURS Performed at [facility] [address] [place], [place] [address]    Report Status PENDING   Comprehensive metabolic panel     Status: Abnormal   Collection Time: 01/12/19  2:14 AM  Result Value Ref Range   Sodium 136 135 - 145 mmol/L   Potassium 3.2 (L) 3.5 - 5.1 mmol/L   Chloride 113 (H) 98 - 111 mmol/L   CO2 13 (L) 22 - 32 mmol/L   Glucose, Bld 103 (H) 70 - 99 mg/dL   BUN 41 (H) 6 - 20 mg/dL   Creatinine, Ser 3.28 (H) 0.44 - 1.00 mg/dL   Calcium 8.8 (L) 8.9 - 10.3 mg/dL   Total Protein 6.6 6.5 - 8.1 g/dL   Albumin 2.7 (L) 3.5 - 5.0 g/dL   AST 20 15 - 41 U/L   ALT 16 0 - 44 U/L   Alkaline Phosphatase 38 38 - 126  U/L   Total Bilirubin 0.3 0.3 - 1.2 mg/dL   GFR calc non Af Amer 15 (L) >60 mL/min   GFR calc Af Amer 17 (L) >60 mL/min   Anion gap 10 5 - 15  Lipase, blood     Status: None   Collection Time: 01/12/19  2:14 AM  Result Value Ref Range   Lipase 23 11 - 51 U/L  C difficile quick scan w PCR reflex     Status: None   Collection Time: 01/12/19  2:14 AM   Specimen: Urine, Clean Catch; Stool  Result Value Ref Range   C Diff antigen NEGATIVE NEGATIVE   C Diff toxin NEGATIVE NEGATIVE   C Diff interpretation No C. difficile detected.   I-Stat beta hCG blood, ED     Status: Abnormal   Collection Time: 01/12/19  2:18 AM  Result Value Ref Range   I-stat hCG, quantitative 5.7 (H) <5 mIU/mL   Comment 3          Magnesium     Status: Abnormal   Collection Time: 01/12/19  6:00 AM  Result Value Ref Range   Magnesium 1.2 (L) 1.7 - 2.4 mg/dL  Phosphorus     Status: Abnormal   Collection Time: 01/12/19  6:00 AM  Result Value Ref Range   Phosphorus 2.3 (L) 2.5 - 4.6 mg/dL   Recent Results (from the past 240 hour(s))  SARS CORONAVIRUS 2 (TAT 6-24 HRS) Nasopharyngeal Nasopharyngeal Swab     Status: None   Collection Time: 01/12/19  1:35 AM   Specimen: Nasopharyngeal Swab  Result Value Ref  Range Status   SARS Coronavirus 2 NEGATIVE NEGATIVE Final    Comment: (NOTE) SARS-CoV-2 target nucleic acids are NOT DETECTED. The SARS-CoV-2 RNA is generally detectable in upper and lower respiratory specimens during the acute phase of infection. Negative results do not preclude SARS-CoV-2 infection, do not rule out co-infections with other pathogens, and should not be used as the sole basis for treatment or other patient management decisions. Negative results must be combined with clinical observations, patient history, and epidemiological information. The expected result is Negative. Fact Sheet for Patients: SugarRoll.be Fact Sheet for Healthcare  Providers: https://www.woods-mathews.com/ This test is not yet approved or cleared by the [place] FDA and  has been authorized for detection and/or diagnosis of SARS-CoV-2 by FDA under an Emergency Use Authorization (EUA). This EUA will remain  in effect (meaning this test can be used) for the duration of the COVID-19 declaration under Section 56 4(b)(1) of the Act, 21 U.S.C. section 360bbb-3(b)(1), unless the authorization is terminated or revoked sooner. Performed at [facility], [place] [address], [place], [place] [address]   Culture, blood (Routine x 2)     Status: None (Preliminary result)   Collection Time: 01/12/19  2:14 AM   Specimen: BLOOD  Result Value Ref Range Status   Specimen Description   Final    BLOOD RIGHT ANTECUBITAL Performed at [facility] [address] [place], [place] [address]    Special Requests   Final    BOTTLES DRAWN AEROBIC AND ANAEROBIC Blood Culture results may not be optimal due to an inadequate volume of blood received in culture bottles Performed at [facility] [address], [place], [place] [address]    Culture   Final    NO GROWTH < 12 HOURS Performed at [facility] [address] [place], [place] [address]    Report Status PENDING  Incomplete  Culture, blood (Routine x 2)     Status: None (Preliminary result)   Collection Time: 01/12/19  2:14 AM   Specimen: BLOOD  Result Value Ref Range Status   Specimen Description   Final    BLOOD LEFT WRIST Performed at [facility] [address], [place], [place] [address]    Special Requests   Final    BOTTLES DRAWN AEROBIC AND ANAEROBIC Blood Culture adequate volume Performed at [facility] [address] [place], [place] [address]    Culture   Final    NO GROWTH < 12 HOURS Performed at [facility] [address], [place], [place] [address]    Report Status PENDING   Incomplete  C difficile quick scan w PCR reflex     Status: None   Collection Time: 01/12/19  2:14 AM   Specimen: Urine, Clean Catch; Stool  Result Value Ref Range Status   C Diff antigen NEGATIVE NEGATIVE Final   C Diff toxin NEGATIVE NEGATIVE Final   C Diff interpretation No C. difficile detected.  Final    Comment: Performed at [facility], [place] [address], [place], [place] [address]   Creatinine: Recent Labs    01/12/19 0214  CREATININE 3.28*   CT scan personally reviewed and is detailed in history of present illness.  Impression/Assessment:  Retained left ureteral stent Left renal calculi Pyelonephritis  Plan:  Agree with admission and IV antibiotics.  I am concerned that if she is discharged she would fail to follow-up which would worsen the encrustation  of the stent.  Therefore, I discussed the option of IV antibiotics until Friday afternoon at which point we will plan for cystoscopy with left ureteroscopy, laser lithotripsy, ureteral stent exchange.  She understands potential for bleeding, infection, injury to surrounding structures, need for additional procedures.  I will likely leave a string on the stent that is exchanged in the event that she is lost to follow-up.  Marton Redwood, III 01/12/2019, 5:20 PM

## 2019-01-12 NOTE — Progress Notes (Addendum)
Please see H&P from this morning for full details.  58 y.o. female with medical history significant of polysubstance (Cocaine, EtOH, and THC) abuse, HTN, anxiety and depression,recently admitted for UGI bleed secondary to gastritis and duodenal ulcers; discharged 10 days ago, presented with increasing N/V, NBNB diarrhea, worsening flank pain and dysuria.  Concerned she may have a UTI.She had chest and back pain during last admission. States that her chest and back pain improved while in the hospital, but never fully subsided. She is noting ongoing pain in her back which radiates down her legs. She does have a history of chronic back pain with sciatica.  ED Course: Tm 100.73F, initially tachy to 110 and hypotensive to 76/57 though this rapidly resolves with IVF.  Creat 3.2 up from 2.5 on 10/16 which in turn is up from 2.1 earlier this year and 1.5 in Aug 2019.Found to have UTI and started on rocephin.    Assessment and plan: Patient continues to complain of left flank pain and has CVA tenderness. Continue antibiotics/IV fluids for pyelonephritis.  Blood pressure now improved to systolic 409B. Patient does have a left ureteric stent which was placed in May 2020 by Dr. Gloriann Loan. Patient might be having ureteric spasms.  Consulted urology.  She apparently did not follow-up with Dr. Gloriann Loan for stent removal.  Urology will evaluate patient and possibly arrange for uretrogram/stent removal on Friday. Ordered Carafate/nicotine patch per patient's request Patient needs greater than 2 midnight inpatient stay for management of pyelonephritis/sepsis with IV antibiotics and AKI with IV fluids. Will follow-up in a.m.

## 2019-01-13 DIAGNOSIS — N179 Acute kidney failure, unspecified: Secondary | ICD-10-CM | POA: Diagnosis not present

## 2019-01-13 DIAGNOSIS — N12 Tubulo-interstitial nephritis, not specified as acute or chronic: Secondary | ICD-10-CM | POA: Diagnosis not present

## 2019-01-13 DIAGNOSIS — R1013 Epigastric pain: Secondary | ICD-10-CM

## 2019-01-13 DIAGNOSIS — N10 Acute tubulo-interstitial nephritis: Secondary | ICD-10-CM | POA: Diagnosis not present

## 2019-01-13 DIAGNOSIS — F329 Major depressive disorder, single episode, unspecified: Secondary | ICD-10-CM

## 2019-01-13 DIAGNOSIS — N184 Chronic kidney disease, stage 4 (severe): Secondary | ICD-10-CM | POA: Diagnosis not present

## 2019-01-13 LAB — BASIC METABOLIC PANEL
Anion gap: 8 (ref 5–15)
BUN: 31 mg/dL — ABNORMAL HIGH (ref 6–20)
CO2: 22 mmol/L (ref 22–32)
Calcium: 7.8 mg/dL — ABNORMAL LOW (ref 8.9–10.3)
Chloride: 106 mmol/L (ref 98–111)
Creatinine, Ser: 2.74 mg/dL — ABNORMAL HIGH (ref 0.44–1.00)
GFR calc Af Amer: 21 mL/min — ABNORMAL LOW (ref >60)
GFR calc non Af Amer: 18 mL/min — ABNORMAL LOW (ref >60)
Glucose, Bld: 94 mg/dL (ref 70–99)
Potassium: 3.1 mmol/L — ABNORMAL LOW (ref 3.5–5.1)
Sodium: 136 mmol/L (ref 135–145)

## 2019-01-13 LAB — CBC
HCT: 22.6 % — ABNORMAL LOW (ref 36.0–46.0)
Hemoglobin: 7.2 g/dL — ABNORMAL LOW (ref 12.0–15.0)
MCH: 30.5 pg (ref 26.0–34.0)
MCHC: 31.9 g/dL (ref 30.0–36.0)
MCV: 95.8 fL (ref 80.0–100.0)
Platelets: 295 10*3/uL (ref 150–400)
RBC: 2.36 MIL/uL — ABNORMAL LOW (ref 3.87–5.11)
RDW: 15.9 % — ABNORMAL HIGH (ref 11.5–15.5)
WBC: 6.8 10*3/uL (ref 4.0–10.5)
nRBC: 0 % (ref 0.0–0.2)

## 2019-01-13 MED ORDER — MIRTAZAPINE 15 MG PO TABS
7.5000 mg | ORAL_TABLET | Freq: Every day | ORAL | Status: DC
  Administered 2019-01-13 – 2019-01-15 (×3): 7.5 mg via ORAL
  Filled 2019-01-13 (×3): qty 1

## 2019-01-13 MED ORDER — SUCRALFATE 1 GM/10ML PO SUSP
1.0000 g | Freq: Three times a day (TID) | ORAL | Status: DC
  Administered 2019-01-13 – 2019-01-16 (×8): 1 g via ORAL
  Filled 2019-01-13 (×8): qty 10

## 2019-01-13 MED ORDER — POTASSIUM CHLORIDE 20 MEQ PO PACK
40.0000 meq | PACK | Freq: Once | ORAL | Status: AC
  Administered 2019-01-13: 40 meq via ORAL
  Filled 2019-01-13 (×2): qty 2

## 2019-01-13 MED ORDER — ACETAMINOPHEN 325 MG PO TABS
325.0000 mg | ORAL_TABLET | Freq: Once | ORAL | Status: AC
  Administered 2019-01-13: 325 mg via ORAL
  Filled 2019-01-13: qty 1

## 2019-01-13 MED ORDER — SODIUM CHLORIDE 0.9 % IV BOLUS
500.0000 mL | Freq: Once | INTRAVENOUS | Status: AC
  Administered 2019-01-13: 500 mL via INTRAVENOUS

## 2019-01-13 NOTE — Plan of Care (Signed)
  Problem: Education: Goal: Knowledge of General Education information will improve Description: Including pain rating scale, medication(s)/side effects and non-pharmacologic comfort measures Outcome: Progressing   Problem: Health Behavior/Discharge Planning: Goal: Ability to manage health-related needs will improve Outcome: Progressing   Problem: Elimination: Goal: Will not experience complications related to bowel motility Outcome: Progressing Goal: Will not experience complications related to urinary retention Outcome: Progressing   Problem: Pain Managment: Goal: General experience of comfort will improve Outcome: Progressing   Problem: Safety: Goal: Ability to remain free from injury will improve Outcome: Progressing   Problem: Skin Integrity: Goal: Risk for impaired skin integrity will decrease Outcome: Progressing   Problem: Urinary Elimination: Goal: Signs and symptoms of infection will decrease Outcome: Progressing

## 2019-01-13 NOTE — Plan of Care (Signed)
  Problem: Education: Goal: Knowledge of General Education information will improve Description: Including pain rating scale, medication(s)/side effects and non-pharmacologic comfort measures Outcome: Completed/Met   Problem: Clinical Measurements: Goal: Ability to maintain clinical measurements within normal limits will improve Outcome: Progressing Goal: Will remain free from infection Outcome: Progressing Goal: Diagnostic test results will improve Outcome: Progressing Goal: Respiratory complications will improve Outcome: Progressing Goal: Cardiovascular complication will be avoided Outcome: Progressing   Problem: Activity: Goal: Risk for activity intolerance will decrease Outcome: Progressing   Problem: Nutrition: Goal: Adequate nutrition will be maintained Outcome: Progressing   Problem: Coping: Goal: Level of anxiety will decrease Outcome: Progressing   Problem: Elimination: Goal: Will not experience complications related to bowel motility Outcome: Progressing Goal: Will not experience complications related to urinary retention Outcome: Progressing   Problem: Pain Managment: Goal: General experience of comfort will improve Outcome: Progressing   Problem: Safety: Goal: Ability to remain free from injury will improve Outcome: Progressing   Problem: Skin Integrity: Goal: Risk for impaired skin integrity will decrease Outcome: Progressing   Problem: Urinary Elimination: Goal: Signs and symptoms of infection will decrease Outcome: Progressing

## 2019-01-13 NOTE — Progress Notes (Addendum)
PROGRESS NOTE    [name]  VZD:[id number]  DOB: 06-13-1960  DOA: 01/12/2019 PCP: Ladell Pier, MD  Brief Narrative:  58 y.o.femalewith medical history significant ofpolysubstance (Cocaine, EtOH, and THC) abuse, HTN, anxiety and depression,recently admitted for UGI bleed secondary to gastritis and duodenal ulcers; discharged 10 days ago, presented with increasing N/V, NBNB diarrhea, worsening flank pain and dysuria. Concerned she may have a UTI.She had chest and back pain during last admission. States that her chest and back pain improved while in the hospital, but never fully subsided. She is noting ongoing pain in her back which radiates down her legs. She does have a history of chronic back pain with sciatica. ED Course:Tm 100.27F, initially tachy to 110 and hypotensive to 76/57 though this rapidly resolves with IVF.Creat 3.2 up from 2.5 on 10/16 which in turn is up from 2.1 earlier this year and 1.5 in Aug 2019.Found to have UTI and started on rocephin.    Subjective:  Patient reports feeling much better today.  She feels some of her epigastric discomfort is related to her not getting her home dose of Carafate.  Seen by urology.  She also reports feeling depressed since her son committed suicide last year.  She reports frequent crying spells and depressed mood/insomnia.  She denies any suicidal or homicidal ideations.  Objective: Vitals:   01/13/19 0423 01/13/19 0745 01/13/19 1205 01/13/19 1604  BP: 101/61 119/64 131/73 122/71  Pulse: 89 97 (!) 104 99  Resp: 18 17 18 17   Temp: 98.3 F (36.8 C) 98.8 F (37.1 C) 99.5 F (37.5 C) 99.2 F (37.3 C)  TempSrc:      SpO2: 100% 100% 98% 99%  Weight:      Height:        Intake/Output Summary (Last 24 hours) at 01/13/2019 1937 Last data filed at 01/13/2019 1800 Gross per 24 hour  Intake 3512.28 ml  Output -  Net 3512.28 ml   Filed Weights   01/12/19 0102  Weight: 64.4 kg    Physical  Examination:  General exam: Appears calm and comfortable  Respiratory system: Clear to auscultation. Respiratory effort normal. Cardiovascular system: S1 & S2 heard, RRR. No JVD, murmurs, rubs, gallops or clicks. No pedal edema. Gastrointestinal system: Abdomen is nondistended, soft , has epigastric and left-sided CVA tenderness.  No organomegaly or masses felt. Normal bowel sounds heard. Central nervous system: Alert and oriented. No focal neurological deficits. Extremities: Symmetric 5 x 5 power. Skin: No rashes, lesions or ulcers Psychiatry: Judgement and insight appear normal. Mood & affect depressed/tearful    Data Reviewed: I have personally reviewed following labs and imaging studies  CBC: Recent Labs  Lab 01/12/19 0214 01/13/19 0359  WBC 11.2* 6.8  NEUTROABS 10.0*  --   HGB 8.3* 7.2*  HCT 27.1* 22.6*  MCV 97.8 95.8  PLT 340 295   Basic Metabolic Panel: Recent Labs  Lab 01/12/19 0214 01/12/19 0600 01/12/19 1917 01/13/19 0359  NA 136  --  135 136  K 3.2*  --  2.9* 3.1*  CL 113*  --  106 106  CO2 13*  --  18* 22  GLUCOSE 103*  --  143* 94  BUN 41*  --  35* 31*  CREATININE 3.28*  --  2.82* 2.74*  CALCIUM 8.8*  --  8.3* 7.8*  MG  --  1.2*  --   --   PHOS  --  2.3*  --   --    GFR: Estimated Creatinine  Clearance: 21.8 mL/min (A) (by C-G formula based on SCr of 2.74 mg/dL (H)). Liver Function Tests: Recent Labs  Lab 01/12/19 0214  AST 20  ALT 16  ALKPHOS 38  BILITOT 0.3  PROT 6.6  ALBUMIN 2.7*   Recent Labs  Lab 01/12/19 0214  LIPASE 23   No results for input(s): AMMONIA in the last 168 hours. Coagulation Profile: No results for input(s): INR, PROTIME in the last 168 hours. Cardiac Enzymes: No results for input(s): CKTOTAL, CKMB, CKMBINDEX, TROPONINI in the last 168 hours. BNP (last 3 results) No results for input(s): PROBNP in the last 8760 hours. HbA1C: No results for input(s): HGBA1C in the last 72 hours. CBG: No results for input(s):  GLUCAP in the last 168 hours. Lipid Profile: No results for input(s): CHOL, HDL, LDLCALC, TRIG, CHOLHDL, LDLDIRECT in the last 72 hours. Thyroid Function Tests: No results for input(s): TSH, T4TOTAL, FREET4, T3FREE, THYROIDAB in the last 72 hours. Anemia Panel: No results for input(s): VITAMINB12, FOLATE, FERRITIN, TIBC, IRON, RETICCTPCT in the last 72 hours. Sepsis Labs: Recent Labs  Lab 01/12/19 0214  LATICACIDVEN 1.3  1.1    Recent Results (from the past 240 hour(s))  SARS CORONAVIRUS 2 (TAT 6-24 HRS) Nasopharyngeal Nasopharyngeal Swab     Status: None   Collection Time: 01/12/19  1:35 AM   Specimen: Nasopharyngeal Swab  Result Value Ref Range Status   SARS Coronavirus 2 NEGATIVE NEGATIVE Final    Comment: (NOTE) SARS-CoV-2 target nucleic acids are NOT DETECTED. The SARS-CoV-2 RNA is generally detectable in upper and lower respiratory specimens during the acute phase of infection. Negative results do not preclude SARS-CoV-2 infection, do not rule out co-infections with other pathogens, and should not be used as the sole basis for treatment or other patient management decisions. Negative results must be combined with clinical observations, patient history, and epidemiological information. The expected result is Negative. Fact Sheet for Patients: SugarRoll.be Fact Sheet for Healthcare Providers: https://www.woods-mathews.com/ This test is not yet approved or cleared by the [place] FDA and  has been authorized for detection and/or diagnosis of SARS-CoV-2 by FDA under an Emergency Use Authorization (EUA). This EUA will remain  in effect (meaning this test can be used) for the duration of the COVID-19 declaration under Section 56 4(b)(1) of the Act, 21 U.S.C. section 360bbb-3(b)(1), unless the authorization is terminated or revoked sooner. Performed at [facility], [place] [address] [place], [place] [address]   Urine  culture     Status: Abnormal   Collection Time: 01/12/19  2:14 AM   Specimen: Urine, Clean Catch  Result Value Ref Range Status   Specimen Description   Final    URINE, CLEAN CATCH Performed at [facility], [place] [address], [place], [place] [address]    Special Requests   Final    NONE Performed at [facility], [place] [address], [place], [place] [address]    Culture MULTIPLE SPECIES PRESENT, SUGGEST RECOLLECTION (A)  Final   Report Status 01/12/2019 FINAL  Final  Culture, blood (Routine x 2)     Status: None (Preliminary result)   Collection Time: 01/12/19  2:14 AM   Specimen: BLOOD  Result Value Ref Range Status   Specimen Description   Final    BLOOD RIGHT ANTECUBITAL Performed at [place] [address], [place],  [address]    Special Requests   Final    BOTTLES DRAWN AEROBIC AND ANAEROBIC Blood Culture results may  not be optimal due to an inadequate volume of blood received in culture bottles Performed at [place] [address], [place], [place] [address]    Culture   Final    NO GROWTH 1 DAY Performed at [facility], [place] [address], [place], [place] [address]    Report Status PENDING  Incomplete  Culture, blood (Routine x 2)     Status: None (Preliminary result)   Collection Time: 01/12/19  2:14 AM   Specimen: BLOOD  Result Value Ref Range Status   Specimen Description   Final    BLOOD LEFT WRIST Performed at [place] [address], [place], [place] [address]    Special Requests   Final    BOTTLES DRAWN AEROBIC AND ANAEROBIC Blood Culture adequate volume Performed at [place] [address] [place], [place] [address]    Culture   Final    NO GROWTH 1 DAY Performed at [facility], [place] [address] [place], [place] [address]    Report Status PENDING  Incomplete  C difficile quick scan w PCR reflex     Status:  None   Collection Time: 01/12/19  2:14 AM   Specimen: Urine, Clean Catch; Stool  Result Value Ref Range Status   C Diff antigen NEGATIVE NEGATIVE Final   C Diff toxin NEGATIVE NEGATIVE Final   C Diff interpretation No C. difficile detected.  Final    Comment: Performed at [facility], [place] [address] [place], [place] [address]      Radiology Studies: US Renal  Result Date: 01/12/2019 CLINICAL DATA:  Pyelonephritis EXAM: RENAL / URINARY TRACT ULTRASOUND COMPLETE COMPARISON:  CT from 12 days ago FINDINGS: Right Kidney: Renal measurements: 7.5 x 4.3 x 5.4 cm = volume: 90 mL. Echogenic cortex. Malrotation with anteriorly positioned hilum. No hydronephrosis or mass. Left Kidney: Renal measurements: 8.4 x 5.1 x 5.7 cm = volume: 125 mL. Ovoid structure measured on study on CT is likely a parenchymal bar based on prior. Pelviectasis is also considered given close proximity of the patient's stent. Renal cortical lobulation and increased echogenicity. Bladder: Left ureteral stent seen within the bladder, likely encrusted based on CT. Otherwise negative IMPRESSION: 1. Left ureteral stent with no hydronephrosis. The stent is likely encrusted at the level of the bladder. 2. Bilateral medical renal disease with atrophy. Right renal malrotation. Electronically Signed   By: Monte Fantasia M.D.   On: 01/12/2019 05:10   Dg Chest Port 1 View  Result Date: 01/12/2019 CLINICAL DATA:  58 year old female with sepsis. EXAM: PORTABLE CHEST 1 VIEW COMPARISON:  Chest radiograph dated 07/31/2018 FINDINGS: The heart size and mediastinal contours are within normal limits. Both lungs are clear. The visualized skeletal structures are unremarkable. IMPRESSION: No active disease. Electronically Signed   By: Anner Crete M.D.   On: 01/12/2019 01:46        Scheduled Meds: . folic acid  1 mg Oral Daily  . heparin  5,000 Units Subcutaneous Q8H  . multivitamin with minerals  1 tablet Oral Daily  .  nicotine  14 mg Transdermal Daily  . sucralfate  1 g Oral TID WC & HS  . tamsulosin  0.4 mg Oral Daily  . thiamine  100 mg Oral Daily   Or  . thiamine  100 mg Intravenous Daily   Continuous Infusions: . cefTRIAXone (ROCEPHIN)  IV Stopped (01/13/19 0451)  .  sodium bicarbonate (isotonic) infusion in sterile  water 125 mL/hr at 01/13/19 1800    Assessment & Plan:    1.  Left-sided pyelonephritis with sepsis: Present on admission.  Requested urology evaluation and appreciate Dr. Purvis Sheffield input.Patient does have a left ureteric stent which was placed in May 2020 by Dr. Gloriann Loan  To continue IV antibiotics and plan for urology intervention on Friday.  Follow-up culture results.  Reduce IV fluid rate as blood pressure improved.  2.  AKI on chronic kidney disease: Appears to be improving and close to baseline now.Creat 3.2 up from 2.5 on 10/16 which in turn is up from 2.1 earlier this year and 1.5 in Aug 2019.  Renal sonogram did not show any hydronephrosis but reported persistent left ureteric stent, medical renal disease bilaterally.  Improved non-anion gap metabolic acidosis with bicarb fluids.  Phosphorus level okay.  Reduce IV fluid rate  3.  Dyspepsia: Resume home medications including Carafate/PPI  4.  Hypokalemia: Improved but still low at 3.1.  Could be dilutional in the setting of IV fluids.  Repeat potassium replacement today.  Labs in a.m.  5.  Depressed mood: Will add Remeron at bedtime.  Requested psychiatry evaluation.  Patient denies suicidal or homicidal ideation.  Patient could have substance-induced mood disorder as well as her recent urine drug screen was positive for cocaine.  6.  Polysubstance abuse: Could be contributing to problem #2 especially in the setting of cocaine abuse.  Counseled regarding risks.  BH consult   DVT prophylaxis: Heparin Code Status: Full code Family / Patient Communication: Discussed with patient Disposition Plan: Home when medically cleared      LOS: 1 day    Time spent: 35 minutes    [place]ilford Shi, MD [facility] Pager [phone]  If 7PM-7AM, please contact night-coverage [email or web address] Password [facility] 01/13/2019, 7:37 PM

## 2019-01-14 DIAGNOSIS — G4700 Insomnia, unspecified: Secondary | ICD-10-CM

## 2019-01-14 DIAGNOSIS — F1994 Other psychoactive substance use, unspecified with psychoactive substance-induced mood disorder: Secondary | ICD-10-CM | POA: Diagnosis present

## 2019-01-14 DIAGNOSIS — F1920 Other psychoactive substance dependence, uncomplicated: Secondary | ICD-10-CM | POA: Diagnosis present

## 2019-01-14 DIAGNOSIS — F10188 Alcohol abuse with other alcohol-induced disorder: Secondary | ICD-10-CM

## 2019-01-14 DIAGNOSIS — F339 Major depressive disorder, recurrent, unspecified: Secondary | ICD-10-CM

## 2019-01-14 DIAGNOSIS — N10 Acute tubulo-interstitial nephritis: Secondary | ICD-10-CM | POA: Diagnosis not present

## 2019-01-14 LAB — GI PATHOGEN PANEL BY PCR, STOOL
Adenovirus F 40/41: NOT DETECTED
Astrovirus: NOT DETECTED
Campylobacter by PCR: NOT DETECTED
Cryptosporidium by PCR: NOT DETECTED
Cyclospora cayetanensis: NOT DETECTED
E coli (ETEC) LT/ST: NOT DETECTED
E coli (STEC): NOT DETECTED
Entamoeba histolytica: NOT DETECTED
Enteroaggregative E coli: NOT DETECTED
Enteropathogenic E coli: NOT DETECTED
G lamblia by PCR: NOT DETECTED
Norovirus GI/GII: NOT DETECTED
Plesiomonas shigelloides: NOT DETECTED
Rotavirus A by PCR: NOT DETECTED
Salmonella by PCR: DETECTED — AB
Sapovirus: NOT DETECTED
Shigella by PCR: NOT DETECTED
Vibrio cholerae: NOT DETECTED
Vibrio: NOT DETECTED
Yersinia enterocolitica: NOT DETECTED

## 2019-01-14 LAB — BASIC METABOLIC PANEL
Anion gap: 8 (ref 5–15)
BUN: 27 mg/dL — ABNORMAL HIGH (ref 6–20)
CO2: 28 mmol/L (ref 22–32)
Calcium: 7.5 mg/dL — ABNORMAL LOW (ref 8.9–10.3)
Chloride: 99 mmol/L (ref 98–111)
Creatinine, Ser: 2.48 mg/dL — ABNORMAL HIGH (ref 0.44–1.00)
GFR calc Af Amer: 24 mL/min — ABNORMAL LOW (ref >60)
GFR calc non Af Amer: 21 mL/min — ABNORMAL LOW (ref >60)
Glucose, Bld: 90 mg/dL (ref 70–99)
Potassium: 3.2 mmol/L — ABNORMAL LOW (ref 3.5–5.1)
Sodium: 135 mmol/L (ref 135–145)

## 2019-01-14 NOTE — Plan of Care (Signed)
  Problem: Clinical Measurements: Goal: Diagnostic test results will improve Outcome: Progressing   Problem: Nutrition: Goal: Adequate nutrition will be maintained Outcome: Progressing   Problem: Clinical Measurements: Goal: Respiratory complications will improve Outcome: Completed/Met Goal: Cardiovascular complication will be avoided Outcome: Completed/Met

## 2019-01-14 NOTE — Progress Notes (Signed)
PROGRESS NOTE    [name]  BJS:[id number] DOB: January 27, 1961 DOA: 01/12/2019 PCP: Ladell Pier, MD  Brief Narrative:58 y.o.femalewith medical history significant ofpolysubstance (Cocaine, EtOH, and THC) abuse, HTN, anxiety and depression,recently admitted for UGI bleed secondary to gastritis and duodenal ulcers; discharged 10 days ago, presented withincreasing N/V, NBNB diarrhea, worsening flank pain and dysuria. Concerned she may have a UTI.She had chest and back pain duringlastadmission. States that her chest and back pain improved while in the hospital, but never fully subsided. She is noting ongoing pain in her back which radiates down her legs. She does have a history of chronic back pain with sciatica. ED Course:Tm 100.68F, initially tachy to 110 and hypotensive to 76/57though this rapidly resolves with IVF.Creat 3.2 up from 2.5 on 10/16 which in turn is up from 2.1 earlier this year and 1.5 in Aug 2019.Found to have UTI and started on rocephin.  Assessment & Plan:   Principal Problem:   Acute pyelonephritis Active Problems:   History of cocaine abuse (HCC)   CKD (chronic kidney disease) stage 4, GFR 15-29 ml/min (HCC)   AKI (acute kidney injury) ([place])   Polysubstance abuse (HCC)   Metabolic acidosis with normal anion gap and failure of bicarbonate regeneration   Sepsis (HCC)   Substance induced mood disorder (HCC)   Polysubstance (including opioids) dependence with physiol dependence ([place])   1.  Left-sided pyelonephritis with sepsis: Present on admission.  Patient has a left ureteric stent which was placed in 08/05/2018 by Dr. Gloriann Loan.  He advised to continue IV antibiotics and probably take out the stent tomorrow.  Urine culture shows mixed growth.   2.  AKI on chronic kidney disease: Appears to be improving and close to baseline now  Renal sonogram did not show any hydronephrosis but reported persistent left ureteric stent, medical renal disease bilaterally.   Improved non-anion gap metabolic acidosis with bicarb fluids.    3.  Dyspepsia: Resume home medications including Carafate/PPI  4.  Hypokalemia: Improved potassium 3.2 today I will not replace this with a creatinine of 2.48.   5.  Depressed mood: Seen by psych does not need inpatient psych admission recommends outpatient resources for polysubstance abuse. urine drug screen was positive for cocaine.  6.  Polysubstance abuse: Needs outpatient follow-up  7 anemia of chronic disease hemoglobin down to 7.2 yesterday this is probably from hemodilution we will follow-up labs tomorrow no evidence of active bleeding.   Estimated body mass index is 22.24 kg/m as calculated from the following:   Height as of this encounter: 5\' 7"  (1.702 m).   Weight as of this encounter: 64.4 kg.  DVT prophylaxis: Heparin  code Status: Full code Family Communication: Discussed with patient  disposition Plan pending clinical improvement and urological intervention 01/15/2019.  Consultants:   Psychiatry  Procedures: None Antimicrobials: ROCEPHIN  Subjective: Resting in bed denies having any diarrhea today she had one loose bowel movement very small last evening no nausea vomiting. Objective: Vitals:   01/13/19 2032 01/14/19 0500 01/14/19 0700 01/14/19 1416  BP:  131/79  125/69  Pulse:  89  84  Resp:  17  18  Temp:  99.1 F (37.3 C)  99.1 F (37.3 C)  TempSrc:  Oral  Oral  SpO2: 100%  95% 94%  Weight:      Height:        Intake/Output Summary (Last 24 hours) at 01/14/2019 1541 Last data filed at 01/14/2019 1300 Gross per 24 hour  Intake 2055.44 ml  Output 801 ml  Net 1254.44 ml   Filed Weights   01/12/19 0102  Weight: 64.4 kg    Examination:  General exam: Appears calm and comfortable  Respiratory system: Clear to auscultation. Respiratory effort normal. Cardiovascular system: S1 & S2 heard, RRR. No JVD, murmurs, rubs, gallops or clicks. No pedal edema. Gastrointestinal system:  Abdomen is nondistended, soft and nontender. No organomegaly or masses felt. Normal bowel sounds heard. Central nervous system: Alert and oriented. No focal neurological deficits. Extremities: Symmetric 5 x 5 power. Skin: No rashes, lesions or ulcers Psychiatry: Judgement and insight appear normal. Mood & affect appropriate.     Data Reviewed: I have personally reviewed following labs and imaging studies  CBC: Recent Labs  Lab 01/12/19 0214 01/13/19 0359  WBC 11.2* 6.8  NEUTROABS 10.0*  --   HGB 8.3* 7.2*  HCT 27.1* 22.6*  MCV 97.8 95.8  PLT 340 177   Basic Metabolic Panel: Recent Labs  Lab 01/12/19 0214 01/12/19 0600 01/12/19 1917 01/13/19 0359 01/14/19 0416  NA 136  --  135 136 135  K 3.2*  --  2.9* 3.1* 3.2*  CL 113*  --  106 106 99  CO2 13*  --  18* 22 28  GLUCOSE 103*  --  143* 94 90  BUN 41*  --  35* 31* 27*  CREATININE 3.28*  --  2.82* 2.74* 2.48*  CALCIUM 8.8*  --  8.3* 7.8* 7.5*  MG  --  1.2*  --   --   --   PHOS  --  2.3*  --   --   --    GFR: Estimated Creatinine Clearance: 24 mL/min (A) (by C-G formula based on SCr of 2.48 mg/dL (H)). Liver Function Tests: Recent Labs  Lab 01/12/19 0214  AST 20  ALT 16  ALKPHOS 38  BILITOT 0.3  PROT 6.6  ALBUMIN 2.7*   Recent Labs  Lab 01/12/19 0214  LIPASE 23   No results for input(s): AMMONIA in the last 168 hours. Coagulation Profile: No results for input(s): INR, PROTIME in the last 168 hours. Cardiac Enzymes: No results for input(s): CKTOTAL, CKMB, CKMBINDEX, TROPONINI in the last 168 hours. BNP (last 3 results) No results for input(s): PROBNP in the last 8760 hours. HbA1C: No results for input(s): HGBA1C in the last 72 hours. CBG: No results for input(s): GLUCAP in the last 168 hours. Lipid Profile: No results for input(s): CHOL, HDL, LDLCALC, TRIG, CHOLHDL, LDLDIRECT in the last 72 hours. Thyroid Function Tests: No results for input(s): TSH, T4TOTAL, FREET4, T3FREE, THYROIDAB in the last 72  hours. Anemia Panel: No results for input(s): VITAMINB12, FOLATE, FERRITIN, TIBC, IRON, RETICCTPCT in the last 72 hours. Sepsis Labs: Recent Labs  Lab 01/12/19 0214  LATICACIDVEN 1.3  1.1    Recent Results (from the past 240 hour(s))  SARS CORONAVIRUS 2 (TAT 6-24 HRS) Nasopharyngeal Nasopharyngeal Swab     Status: None   Collection Time: 01/12/19  1:35 AM   Specimen: Nasopharyngeal Swab  Result Value Ref Range Status   SARS Coronavirus 2 NEGATIVE NEGATIVE Final    Comment: (NOTE) SARS-CoV-2 target nucleic acids are NOT DETECTED. The SARS-CoV-2 RNA is generally detectable in upper and lower respiratory specimens during the acute phase of infection. Negative results do not preclude SARS-CoV-2 infection, do not rule out co-infections with other pathogens, and should not be used as the sole basis for treatment or other patient management decisions. Negative results must be combined with clinical observations, patient history,  and epidemiological information. The expected result is Negative. Fact Sheet for Patients: SugarRoll.be Fact Sheet for Healthcare Providers: https://www.woods-[employer]al[employer] Mastro.com/ This test is not yet approved or cleared by the [place] FDA and  has been authorized for detection and/or diagnosis of SARS-CoV-2 by FDA under an Emergency Use Authorization (EUA). This EUA will remain  in effect (meaning this test can be used) for the duration of the COVID-19 declaration under Section 56 4(b)(1) of the Act, 21 U.S.C. section 360bbb-3(b)(1), unless the authorization is terminated or revoked sooner. Performed at [facility], [place] [address], [place], [place] [address]   Urine culture     Status: Abnormal   Collection Time: 01/12/19  2:14 AM   Specimen: Urine, Clean Catch  Result Value Ref Range Status   Specimen Description   Final    URINE, CLEAN CATCH Performed at [facility], [place] [address], [place], [place] [address]    Special Requests   Final    NONE Performed at [facility], [place] [address] [place], [place] [address]    Culture MULTIPLE SPECIES PRESENT, SUGGEST RECOLLECTION (A)  Final   Report Status 01/12/2019 FINAL  Final  Culture, blood (Routine x 2)     Status: None (Preliminary result)   Collection Time: 01/12/19  2:14 AM   Specimen: BLOOD  Result Value Ref Range Status   Specimen Description   Final    BLOOD RIGHT ANTECUBITAL Performed at [place] [address] [place], [place] [address]    Special Requests   Final    BOTTLES DRAWN AEROBIC AND ANAEROBIC Blood Culture results may not be optimal due to an inadequate volume of blood received in culture bottles Performed at [place] [address], [place], [place] [address]    Culture   Final    NO GROWTH 2 DAYS Performed at [place] [address] [place], [place] [address]    Report Status PENDING  Incomplete  Culture, blood (Routine x 2)     Status: None (Preliminary result)   Collection Time: 01/12/19  2:14 AM   Specimen: BLOOD  Result Value Ref Range Status   Specimen Description   Final    BLOOD LEFT WRIST Performed at [place] [address] [place], [place] [address]    Special Requests   Final    BOTTLES DRAWN AEROBIC AND ANAEROBIC Blood Culture adequate volume Performed at [place] [address] [place] [address], [place], [place] [address]    Culture   Final    NO GROWTH 2 DAYS Performed at [place] [address] [place], [place] [address]    Report Status PENDING  Incomplete  GI pathogen panel by PCR, stool     Status: Abnormal   Collection Time: 01/12/19  2:14 AM   Specimen: Urine, Clean Catch; Stool  Result Value Ref Range Status   Plesiomonas shigelloides NOT DETECTED NOT DETECTED Final   Yersinia enterocolitica NOT DETECTED NOT DETECTED Final   Vibrio NOT  DETECTED NOT DETECTED Final   Enteropathogenic E coli NOT DETECTED NOT DETECTED Final   E coli (ETEC) LT/ST NOT DETECTED NOT DETECTED Final   E coli 0623 by PCR Not applicable NOT DETECTED Final   Cryptosporidium by PCR NOT DETECTED NOT DETECTED Final   Entamoeba histolytica NOT DETECTED NOT DETECTED Final   Adenovirus F 40/41 NOT DETECTED NOT DETECTED Final   Norovirus GI/GII NOT DETECTED  NOT DETECTED Final   Sapovirus NOT DETECTED NOT DETECTED Final    Comment: (NOTE) Performed At: [facility] [place], [place] [id number] Rush Farmer MD IP:[id number]    Vibrio cholerae NOT DETECTED NOT DETECTED Final   Campylobacter by PCR NOT DETECTED NOT DETECTED Final   Salmonella by PCR DETECTED (A) NOT DETECTED Final   E coli (STEC) NOT DETECTED NOT DETECTED Final   Enteroaggregative E coli NOT DETECTED NOT DETECTED Final   Shigella by PCR NOT DETECTED NOT DETECTED Final   Cyclospora cayetanensis NOT DETECTED NOT DETECTED Final   Astrovirus NOT DETECTED NOT DETECTED Final   G lamblia by PCR NOT DETECTED NOT DETECTED Final   Rotavirus A by PCR NOT DETECTED NOT DETECTED Final  C difficile quick scan w PCR reflex     Status: None   Collection Time: 01/12/19  2:14 AM   Specimen: Urine, Clean Catch; Stool  Result Value Ref Range Status   C Diff antigen NEGATIVE NEGATIVE Final   C Diff toxin NEGATIVE NEGATIVE Final   C Diff interpretation No C. difficile detected.  Final    Comment: Performed at [facility], [place] [address], [place], [place] [address]         Radiology Studies: No results found.      Scheduled Meds: . folic acid  1 mg Oral Daily  . heparin  5,000 Units Subcutaneous Q8H  . mirtazapine  7.5 mg Oral QHS  . multivitamin with minerals  1 tablet Oral Daily  . nicotine  14 mg Transdermal Daily  . sucralfate  1 g Oral TID WC & HS  . tamsulosin  0.4 mg Oral Daily  . thiamine  100 mg Oral Daily   Or  . thiamine  100 mg  Intravenous Daily   Continuous Infusions: . cefTRIAXone (ROCEPHIN)  IV 1 g (01/14/19 0519)  .  sodium bicarbonate (isotonic) infusion in sterile water 75 mL/hr at 01/14/19 1210     LOS: 2 days     Georgette Shell, MD [facility]  If 7PM-7AM, please contact night-coverage [email or web address] Password [email or web address] 01/14/2019, 3:41 PM
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
## 2019-01-15 ENCOUNTER — Encounter (HOSPITAL_COMMUNITY)
Admission: EM | Disposition: A | Discharge status: Home / Self Care | Payer: Self-pay | Source: Home / Self Care | Attending: Internal Medicine

## 2019-01-15 ENCOUNTER — Inpatient Hospital Stay (HOSPITAL_COMMUNITY): Payer: Medicare Other | Admitting: Anesthesiology

## 2019-01-15 ENCOUNTER — Inpatient Hospital Stay (HOSPITAL_COMMUNITY): Payer: Medicare Other

## 2019-01-15 ENCOUNTER — Inpatient Hospital Stay (HOSPITAL_COMMUNITY): Admission: RE | Admit: 2019-01-15 | Payer: Medicare Other | Source: Home / Self Care | Admitting: Urology

## 2019-01-15 ENCOUNTER — Encounter (HOSPITAL_COMMUNITY): Payer: Self-pay | Admitting: *Deleted

## 2019-01-15 DIAGNOSIS — N10 Acute tubulo-interstitial nephritis: Secondary | ICD-10-CM | POA: Diagnosis not present

## 2019-01-15 HISTORY — PX: CYSTOSCOPY WITH RETROGRADE PYELOGRAM, URETEROSCOPY AND STENT PLACEMENT: SHX5789

## 2019-01-15 LAB — CBC
HCT: 22.7 % — ABNORMAL LOW (ref 36.0–46.0)
Hemoglobin: 7.2 g/dL — ABNORMAL LOW (ref 12.0–15.0)
MCH: 30.4 pg (ref 26.0–34.0)
MCHC: 31.7 g/dL (ref 30.0–36.0)
MCV: 95.8 fL (ref 80.0–100.0)
Platelets: 262 10*3/uL (ref 150–400)
RBC: 2.37 MIL/uL — ABNORMAL LOW (ref 3.87–5.11)
RDW: 15.4 % (ref 11.5–15.5)
WBC: 6.5 10*3/uL (ref 4.0–10.5)
nRBC: 0 % (ref 0.0–0.2)

## 2019-01-15 LAB — SURGICAL PCR SCREEN
MRSA, PCR: NEGATIVE
Staphylococcus aureus: NEGATIVE

## 2019-01-15 LAB — COMPREHENSIVE METABOLIC PANEL
ALT: 16 U/L (ref 0–44)
AST: 35 U/L (ref 15–41)
Albumin: 2.2 g/dL — ABNORMAL LOW (ref 3.5–5.0)
Alkaline Phosphatase: 55 U/L (ref 38–126)
Anion gap: 11 (ref 5–15)
BUN: 23 mg/dL — ABNORMAL HIGH (ref 6–20)
CO2: 31 mmol/L (ref 22–32)
Calcium: 8 mg/dL — ABNORMAL LOW (ref 8.9–10.3)
Chloride: 97 mmol/L — ABNORMAL LOW (ref 98–111)
Creatinine, Ser: 2.41 mg/dL — ABNORMAL HIGH (ref 0.44–1.00)
GFR calc Af Amer: 25 mL/min — ABNORMAL LOW (ref >60)
GFR calc non Af Amer: 21 mL/min — ABNORMAL LOW (ref >60)
Glucose, Bld: 88 mg/dL (ref 70–99)
Potassium: 3.1 mmol/L — ABNORMAL LOW (ref 3.5–5.1)
Sodium: 139 mmol/L (ref 135–145)
Total Bilirubin: 0.2 mg/dL — ABNORMAL LOW (ref 0.3–1.2)
Total Protein: 5.8 g/dL — ABNORMAL LOW (ref 6.5–8.1)

## 2019-01-15 SURGERY — CYSTOURETEROSCOPY, WITH RETROGRADE PYELOGRAM AND STENT INSERTION
Anesthesia: General | Laterality: Left

## 2019-01-15 MED ORDER — PHENYLEPHRINE HCL-NACL 10-0.9 MG/250ML-% IV SOLN
INTRAVENOUS | Status: DC | PRN
  Administered 2019-01-15: 40 ug/min via INTRAVENOUS

## 2019-01-15 MED ORDER — OXYCODONE HCL 5 MG/5ML PO SOLN: 5.0000 mg | Freq: Once | ORAL | Status: DC | PRN

## 2019-01-15 MED ORDER — LIDOCAINE 2% (20 MG/ML) 5 ML SYRINGE
INTRAMUSCULAR | Status: DC | PRN
  Administered 2019-01-15: 60 mg via INTRAVENOUS

## 2019-01-15 MED ORDER — POTASSIUM CHLORIDE CRYS ER 20 MEQ PO TBCR
40.0000 meq | EXTENDED_RELEASE_TABLET | Freq: Once | ORAL | Status: AC
  Administered 2019-01-15: 40 meq via ORAL
  Filled 2019-01-15: qty 2

## 2019-01-15 MED ORDER — SODIUM CHLORIDE 0.9 % IR SOLN
Status: DC | PRN
  Administered 2019-01-15: 3000 mL

## 2019-01-15 MED ORDER — IOHEXOL 300 MG/ML  SOLN
INTRAMUSCULAR | Status: DC | PRN
  Administered 2019-01-15: 3 mL via URETHRAL

## 2019-01-15 MED ORDER — ACETAMINOPHEN 10 MG/ML IV SOLN: 1000.0000 mg | Freq: Once | INTRAVENOUS | Status: DC | PRN

## 2019-01-15 MED ORDER — PROMETHAZINE HCL 25 MG/ML IJ SOLN: 6.2500 mg | INTRAMUSCULAR | Status: DC | PRN

## 2019-01-15 MED ORDER — ONDANSETRON HCL 4 MG/2ML IJ SOLN
INTRAMUSCULAR | Status: DC | PRN
  Administered 2019-01-15: 4 mg via INTRAVENOUS

## 2019-01-15 MED ORDER — SODIUM CHLORIDE 0.9 % IV SOLN
INTRAVENOUS | Status: AC
  Administered 2019-01-15: 15:00:00 via INTRAVENOUS

## 2019-01-15 MED ORDER — MIDAZOLAM HCL 5 MG/5ML IJ SOLN
INTRAMUSCULAR | Status: DC | PRN
  Administered 2019-01-15: 2 mg via INTRAVENOUS

## 2019-01-15 MED ORDER — PROPOFOL 10 MG/ML IV BOLUS
INTRAVENOUS | Status: DC | PRN
  Administered 2019-01-15: 130 mg via INTRAVENOUS

## 2019-01-15 MED ORDER — HYDROMORPHONE HCL 1 MG/ML IJ SOLN: 0.2500 mg | INTRAMUSCULAR | Status: DC | PRN

## 2019-01-15 MED ORDER — FENTANYL CITRATE (PF) 100 MCG/2ML IJ SOLN
INTRAMUSCULAR | Status: DC | PRN
  Administered 2019-01-15 (×2): 50 ug via INTRAVENOUS

## 2019-01-15 MED ORDER — CARVEDILOL 3.125 MG PO TABS
3.1250 mg | ORAL_TABLET | Freq: Two times a day (BID) | ORAL | Status: DC
  Administered 2019-01-15 – 2019-01-16 (×3): 3.125 mg via ORAL
  Filled 2019-01-15 (×3): qty 1

## 2019-01-15 MED ORDER — OXYCODONE HCL 5 MG PO TABS: 5.0000 mg | ORAL_TABLET | Freq: Once | ORAL | Status: DC | PRN

## 2019-01-15 MED ORDER — DEXAMETHASONE SODIUM PHOSPHATE 10 MG/ML IJ SOLN
INTRAMUSCULAR | Status: DC | PRN
  Administered 2019-01-15: 4 mg via INTRAVENOUS

## 2019-01-15 SURGICAL SUPPLY — 22 items
BAG URO CATCHER STRL LF (MISCELLANEOUS) ×2 IMPLANT
BASKET LASER NITINOL 1.9FR (BASKET) IMPLANT
BASKET ZERO TIP NITINOL 2.4FR (BASKET) IMPLANT
BSKT STON RTRVL 120 1.9FR (BASKET)
BSKT STON RTRVL ZERO TP 2.4FR (BASKET)
CATH INTERMIT  6FR 70CM (CATHETERS) ×1 IMPLANT
CLOTH BEACON ORANGE TIMEOUT ST (SAFETY) ×2 IMPLANT
COVER WAND RF STERILE (DRAPES) IMPLANT
EXTRACTOR STONE 1.7FRX115CM (UROLOGICAL SUPPLIES) IMPLANT
FIBER LASER FLEXIVA 365 (UROLOGICAL SUPPLIES) IMPLANT
FIBER LASER TRAC TIP (UROLOGICAL SUPPLIES) IMPLANT
GLOVE BIO SURGEON STRL SZ7.5 (GLOVE) ×2 IMPLANT
GOWN STRL REUS W/TWL XL LVL3 (GOWN DISPOSABLE) ×2 IMPLANT
GUIDEWIRE ANG ZIPWIRE 038X150 (WIRE) IMPLANT
GUIDEWIRE STR DUAL SENSOR (WIRE) ×3 IMPLANT
KIT TURNOVER KIT A (KITS) IMPLANT
MANIFOLD NEPTUNE II (INSTRUMENTS) ×2 IMPLANT
PACK CYSTO (CUSTOM PROCEDURE TRAY) ×2 IMPLANT
SHEATH URETERAL 12FRX28CM (UROLOGICAL SUPPLIES) IMPLANT
SHEATH URETERAL 12FRX35CM (MISCELLANEOUS) IMPLANT
STENT URET 6FRX24 CONTOUR (STENTS) ×1 IMPLANT
TUBING UROLOGY SET (TUBING) ×2 IMPLANT

## 2019-01-15 NOTE — Interval H&P Note (Signed)
History and Physical Interval Note:  01/15/2019 3:52 PM  [name]  has presented today for surgery, with the diagnosis of left kidney stone.  The various methods of treatment have been discussed with the patient and family. After consideration of risks, benefits and other options for treatment, the patient has consented to  Procedure(s): CYSTOSCOPY WITH RETROGRADE PYELOGRAM, URETEROSCOPY AND STENT PLACEMENT (Left) HOLMIUM LASER APPLICATION (Left) as a surgical intervention.  The patient's history has been reviewed, patient examined, no change in status, stable for surgery.  I have reviewed the patient's chart and labs.  Questions were answered to the patient's satisfaction.     Marton Redwood, III

## 2019-01-15 NOTE — Op Note (Signed)
Operative Note  Preoperative diagnosis:  1.  Left ureteral calculus  Postoperative diagnosis: 1.  Left ureteral calculus  Procedure(s): 1.  Cystoscopy with left retrograde pyelogram, left diagnostic ureteroscopy, left ureteral stent exchange  Surgeon: Link Snuffer, MD  Assistants: None  Anesthesia: General  Complications: None immediate  EBL: Minimal  Specimens: 1.  None  Drains/Catheters: 1.  6 x 24 double-J ureteral stent with a string  Intraoperative findings: 1.  Normal urethra 2.  Normal bladder 3.  Left retrograde pyelogram revealed no hydronephrosis and no filling defect. 4.  Left diagnostic ureteroscopy revealed no stone remaining.  Indication: 58 year old female status post left ureteral stent placement urgently for an obstructing ureteral calculus.  She presents for the previously mentioned operation.  Description of procedure:  The patient was identified and consent was obtained.  The patient was taken to the operating room and placed in the supine position.  The patient was placed under general anesthesia.  Perioperative antibiotics were administered.  The patient was placed in dorsal lithotomy.  Patient was prepped and draped in a standard sterile fashion and a timeout was performed.  A 21 French rigid cystoscope was advanced into the urethra and into the bladder.  The stent was grasped and withdrawn.  A wire was then advanced up the left ureter and into the kidney under fluoroscopic guidance.  A semirigid ureteroscope was advanced alongside the wire up to the renal pelvis.  No stone was identified.  I therefore advanced a digital ureteroscope over the wire and into the kidney and withdrew the wire.  Complete pyeloscopy was performed and no stones were identified.  There was some small amount of debris.  I shot a retrograde pyelogram through the scope with findings noted above.  I readvanced the wire through the scope and into the kidney and withdrew the scope.  I then  placed a 6 x 24 double-J ureteral stent over the wire under continuous fluoroscopic guidance and withdrew the wire.  Fluoroscopy confirmed proximal and distal placement.  I drained the bladder and this concluded the operation.  Patient tolerated procedure well and was stable postoperatively.  Plan: Stent can be removed on Monday.

## 2019-01-15 NOTE — Plan of Care (Signed)
  Problem: Health Behavior/Discharge Planning: Goal: Ability to manage health-related needs will improve Outcome: Progressing   Problem: Clinical Measurements: Goal: Ability to maintain clinical measurements within normal limits will improve Outcome: Progressing Goal: Will remain free from infection Outcome: Progressing Goal: Diagnostic test results will improve Outcome: Progressing   

## 2019-01-15 NOTE — Anesthesia Preprocedure Evaluation (Addendum)
Anesthesia Evaluation  Patient identified by MRN, date of birth, ID band Patient awake    Reviewed: Allergy & Precautions, NPO status , Patient's Chart, lab work & pertinent test results  Airway Mallampati: II  TM Distance: >3 FB Neck ROM: Full    Dental  (+) Edentulous Upper, Edentulous Lower   Pulmonary asthma , COPD,  COPD inhaler, Current Smoker and Patient abstained from smoking.,    Pulmonary exam normal breath sounds clear to auscultation       Cardiovascular hypertension, Pt. on medications and Pt. on home beta blockers Normal cardiovascular exam Rhythm:Regular Rate:Normal  ECG: ST, rate 101   Neuro/Psych PSYCHIATRIC DISORDERS Anxiety Depression negative neurological ROS     GI/Hepatic PUD, GERD  Medicated and Controlled,(+)     substance abuse  ,   Endo/Other  diabetes  Renal/GU CRFRenal disease     Musculoskeletal negative musculoskeletal ROS (+)   Abdominal   Peds  Hematology  (+) anemia , HLD   Anesthesia Other Findings left kidney stone  Reproductive/Obstetrics                            Anesthesia Physical Anesthesia Plan  ASA: III  Anesthesia Plan: General   Post-op Pain Management:    Induction: Intravenous  PONV Risk Score and Plan: 2 and Ondansetron, Midazolam, Dexamethasone and Treatment may vary due to age or medical condition  Airway Management Planned: LMA  Additional Equipment:   Intra-op Plan:   Post-operative Plan: Extubation in OR  Informed Consent: I have reviewed the patients History and Physical, chart, labs and discussed the procedure including the risks, benefits and alternatives for the proposed anesthesia with the patient or authorized representative who has indicated his/her understanding and acceptance.     Dental advisory given  Plan Discussed with: CRNA  Anesthesia Plan Comments:         Anesthesia Quick Evaluation

## 2019-01-15 NOTE — Progress Notes (Signed)
PROGRESS NOTE    [name]  ZOX:[id number] DOB: December 31, 1960 DOA: 01/12/2019 PCP: Ladell Pier, MD  Brief Narrative: 58 y.o.femalewith medical history significant ofpolysubstance (Cocaine, EtOH, and THC) abuse, HTN, anxiety and depression,recently admitted for UGI bleed secondary to gastritis and duodenal ulcers; discharged 10 days ago, presented withincreasing N/V, NBNB diarrhea, worsening flank pain and dysuria. Concerned she may have a UTI.She had chest and back pain duringlastadmission. States that her chest and back pain improved while in the hospital, but never fully subsided. She is noting ongoing pain in her back which radiates down her legs. She does have a history of chronic back pain with sciatica. ED Course:Tm 100.66F, initially tachy to 110 and hypotensive to 76/57though this rapidly resolves with IVF.Creat 3.2 up from 2.5 on 10/16 which in turn is up from 2.1 earlier this year and 1.5 in Aug 2019.Found to have UTI and started on rocephin.  Assessment & Plan:   Principal Problem:   Acute pyelonephritis Active Problems:   History of cocaine abuse (HCC)   CKD (chronic kidney disease) stage 4, GFR 15-29 ml/min (HCC)   AKI (acute kidney injury) ([place])   Polysubstance abuse (HCC)   Metabolic acidosis with normal anion gap and failure of bicarbonate regeneration   Sepsis (HCC)   Substance induced mood disorder (HCC)   Polysubstance (including opioids) dependence with physiol dependence ([place])   1.Left-sided pyelonephritis with sepsis: Present on admission.  Patient has a left ureteric stent which was placed in 08/05/2018 by Dr. Gloriann Loan.  He advised to continue IV rocephin and  take out the stent today.  Urine culture shows mixed growth.  2.AKI on chronic kidney disease: Appears to be improving and close to baseline nowRenal sonogram did not show any hydronephrosis but reported persistent left ureteric stent, medical renal disease bilaterally. Improved  non-anion gap metabolic acidosiswith bicarb fluids.  3.Dyspepsia: Resume home medications including Carafate/PPI  4.Hypokalemia: Improved potassium 3.1 today replete  5.Depressed mood: Seen by psych does not need inpatient psych admission recommends outpatient resources for polysubstance abuse. urine drug screen was positive for cocaine.  6.Polysubstance abuse: Needs outpatient follow-up  7 anemia of chronic disease hemoglobin down to 7.2 today no evidence of active bleeding.      Estimated body mass index is 22.24 kg/m as calculated from the following:   Height as of this encounter: 5\' 7"  (1.702 m).   Weight as of this encounter: 64.4 kg.    DVT prophylaxis: Heparin  code Status: Full code Family Communication: Discussed with patient  disposition Plan pending clinical improvement and urological intervention 01/15/2019.  Consultants:   Psychiatry  Procedures: None Antimicrobials: ROCEPHIN  Subjective: No diarrhea wants to eat anxious to have procedure today  Objective: Vitals:   01/14/19 0700 01/14/19 1416 01/14/19 2207 01/15/19 0512  BP:  125/69 (!) 142/76 (!) 147/79  Pulse:  84 87 82  Resp:  18 18 18   Temp:  99.1 F (37.3 C) 99.3 F (37.4 C) 98.4 F (36.9 C)  TempSrc:  Oral    SpO2: 95% 94% 97% 96%  Weight:      Height:        Intake/Output Summary (Last 24 hours) at 01/15/2019 1257 Last data filed at 01/14/2019 2100 Gross per 24 hour  Intake 1145.7 ml  Output -  Net 1145.7 ml   Filed Weights   01/12/19 0102  Weight: 64.4 kg    Examination:  General exam: Appears calm and comfortable  Respiratory system: Clear to auscultation. Respiratory effort  normal. Cardiovascular system: S1 & S2 heard, RRR. No JVD, murmurs, rubs, gallops or clicks. No pedal edema. Gastrointestinal system: Abdomen is nondistended, soft and left cva tender. No organomegaly or masses felt. Normal bowel sounds heard. Central nervous system: Alert and  oriented. No focal neurological deficits. Extremities: Symmetric 5 x 5 power. Skin: No rashes, lesions or ulcers Psychiatry: Judgement and insight appear normal. Mood & affect appropriate.     Data Reviewed: I have personally reviewed following labs and imaging studies  CBC: Recent Labs  Lab 01/12/19 0214 01/13/19 0359 01/15/19 0346  WBC 11.2* 6.8 6.5  NEUTROABS 10.0*  --   --   HGB 8.3* 7.2* 7.2*  HCT 27.1* 22.6* 22.7*  MCV 97.8 95.8 95.8  PLT 340 295 408   Basic Metabolic Panel: Recent Labs  Lab 01/12/19 0214 01/12/19 0600 01/12/19 1917 01/13/19 0359 01/14/19 0416 01/15/19 0346  NA 136  --  135 136 135 139  K 3.2*  --  2.9* 3.1* 3.2* 3.1*  CL 113*  --  106 106 99 97*  CO2 13*  --  18* 22 28 31   GLUCOSE 103*  --  143* 94 90 88  BUN 41*  --  35* 31* 27* 23*  CREATININE 3.28*  --  2.82* 2.74* 2.48* 2.41*  CALCIUM 8.8*  --  8.3* 7.8* 7.5* 8.0*  MG  --  1.2*  --   --   --   --   PHOS  --  2.3*  --   --   --   --    GFR: Estimated Creatinine Clearance: 24.7 mL/min (A) (by C-G formula based on SCr of 2.41 mg/dL (H)). Liver Function Tests: Recent Labs  Lab 01/12/19 0214 01/15/19 0346  AST 20 35  ALT 16 16  ALKPHOS 38 55  BILITOT 0.3 0.2*  PROT 6.6 5.8*  ALBUMIN 2.7* 2.2*   Recent Labs  Lab 01/12/19 0214  LIPASE 23   No results for input(s): AMMONIA in the last 168 hours. Coagulation Profile: No results for input(s): INR, PROTIME in the last 168 hours. Cardiac Enzymes: No results for input(s): CKTOTAL, CKMB, CKMBINDEX, TROPONINI in the last 168 hours. BNP (last 3 results) No results for input(s): PROBNP in the last 8760 hours. HbA1C: No results for input(s): HGBA1C in the last 72 hours. CBG: No results for input(s): GLUCAP in the last 168 hours. Lipid Profile: No results for input(s): CHOL, HDL, LDLCALC, TRIG, CHOLHDL, LDLDIRECT in the last 72 hours. Thyroid Function Tests: No results for input(s): TSH, T4TOTAL, FREET4, T3FREE, THYROIDAB in the last  72 hours. Anemia Panel: No results for input(s): VITAMINB12, FOLATE, FERRITIN, TIBC, IRON, RETICCTPCT in the last 72 hours. Sepsis Labs: Recent Labs  Lab 01/12/19 0214  LATICACIDVEN 1.3  1.1    Recent Results (from the past 240 hour(s))  SARS CORONAVIRUS 2 (TAT 6-24 HRS) Nasopharyngeal Nasopharyngeal Swab     Status: None   Collection Time: 01/12/19  1:35 AM   Specimen: Nasopharyngeal Swab  Result Value Ref Range Status   SARS Coronavirus 2 NEGATIVE NEGATIVE Final    Comment: (NOTE) SARS-CoV-2 target nucleic acids are NOT DETECTED. The SARS-CoV-2 RNA is generally detectable in upper and lower respiratory specimens during the acute phase of infection. Negative results do not preclude SARS-CoV-2 infection, do not rule out co-infections with other pathogens, and should not be used as the sole basis for treatment or other patient management decisions. Negative results must be combined with clinical observations, patient history, and epidemiological  information. The expected result is Negative. Fact Sheet for Patients: SugarRoll.be Fact Sheet for Healthcare Providers: https://www.[facility].com/ This test is not yet approved or cleared by the [place] FDA and  has been authorized for detection and/or diagnosis of SARS-CoV-2 by FDA under an Emergency Use Authorization (EUA). This EUA will remain  in effect (meaning this test can be used) for the duration of the COVID-19 declaration under Section 56 4(b)(1) of the Act, 21 U.S.C. section 360bbb-3(b)(1), unless the authorization is terminated or revoked sooner. Performed at [facility], [place] [address] [place], [place] [address]   Urine culture     Status: Abnormal   Collection Time: 01/12/19  2:14 AM   Specimen: Urine, Clean Catch  Result Value Ref Range Status   Specimen Description   Final    URINE, CLEAN CATCH Performed at [facility], [place]  [address] [place], [place] [address]    Special Requests   Final    NONE Performed at [facility], [place] [address] [place], [place] [address]    Culture MULTIPLE SPECIES PRESENT, SUGGEST RECOLLECTION (A)  Final   Report Status 01/12/2019 FINAL  Final  Culture, blood (Routine x 2)     Status: None (Preliminary result)   Collection Time: 01/12/19  2:14 AM   Specimen: BLOOD  Result Value Ref Range Status   Specimen Description   Final    BLOOD RIGHT ANTECUBITAL Performed at [place] [address], [place], [place] [address]    Special Requests   Final    BOTTLES DRAWN AEROBIC AND ANAEROBIC Blood Culture results may not be optimal due to an inadequate volume of blood received in culture bottles Performed at [place] [address] [place], [place] [address]    Culture   Final    NO GROWTH 3 DAYS Performed at [facility], [place] [address] [place], [place] [address]    Report Status PENDING  Incomplete  Culture, blood (Routine x 2)     Status: None (Preliminary result)   Collection Time: 01/12/19  2:14 AM   Specimen: BLOOD  Result Value Ref Range Status   Specimen Description   Final    BLOOD LEFT WRIST Performed at [place] [address], [place], [place] [address]    Special Requests   Final    BOTTLES DRAWN AEROBIC AND ANAEROBIC Blood Culture adequate volume Performed at [place] [address], [place], [place] [address]    Culture   Final    NO GROWTH 3 DAYS Performed at [facility], [place] [address], [place],  [address]    Report Status PENDING  Incomplete  GI pathogen panel by PCR, stool     Status: Abnormal   Collection Time: 01/12/19  2:14 AM   Specimen: Urine, Clean Catch; Stool  Result Value Ref Range Status   Plesiomonas shigelloides NOT DETECTED NOT DETECTED Final   Yersinia enterocolitica NOT DETECTED NOT DETECTED Final    Vibrio NOT DETECTED NOT DETECTED Final   Enteropathogenic E coli NOT DETECTED NOT DETECTED Final   E coli (ETEC) LT/ST NOT DETECTED NOT DETECTED Final   E coli 2876 by PCR Not applicable NOT DETECTED Final   Cryptosporidium by PCR NOT DETECTED NOT DETECTED Final   Entamoeba histolytica NOT DETECTED NOT DETECTED Final   Adenovirus F 40/41 NOT DETECTED NOT DETECTED Final   Norovirus GI/GII NOT DETECTED NOT DETECTED  Final   Sapovirus NOT DETECTED NOT DETECTED Final    Comment: (NOTE) Performed At: [facility] [place], [place] [id number] Rush Farmer MD NW:[id number]    Vibrio cholerae NOT DETECTED NOT DETECTED Final   Campylobacter by PCR NOT DETECTED NOT DETECTED Final   Salmonella by PCR DETECTED (A) NOT DETECTED Final   E coli (STEC) NOT DETECTED NOT DETECTED Final   Enteroaggregative E coli NOT DETECTED NOT DETECTED Final   Shigella by PCR NOT DETECTED NOT DETECTED Final   Cyclospora cayetanensis NOT DETECTED NOT DETECTED Final   Astrovirus NOT DETECTED NOT DETECTED Final   G lamblia by PCR NOT DETECTED NOT DETECTED Final   Rotavirus A by PCR NOT DETECTED NOT DETECTED Final  C difficile quick scan w PCR reflex     Status: None   Collection Time: 01/12/19  2:14 AM   Specimen: Urine, Clean Catch; Stool  Result Value Ref Range Status   C Diff antigen NEGATIVE NEGATIVE Final   C Diff toxin NEGATIVE NEGATIVE Final   C Diff interpretation No C. difficile detected.  Final    Comment: Performed at [facility], [place] [address], [place], [place] [address]  Surgical pcr screen     Status: None   Collection Time: 01/15/19 10:41 AM   Specimen: Nasal Mucosa; Nasal Swab  Result Value Ref Range Status   MRSA, PCR NEGATIVE NEGATIVE Final   Staphylococcus aureus NEGATIVE NEGATIVE Final    Comment: (NOTE) The Xpert SA Assay (FDA approved for NASAL specimens in patients 69 years of age and older), is one component of a comprehensive surveillance  program. It is not intended to diagnose infection nor to guide or monitor treatment. Performed at [facility], [place] [address], [place], [place] [address]          Radiology Studies: No results found.      Scheduled Meds: . carvedilol  3.125 mg Oral BID  . folic acid  1 mg Oral Daily  . heparin  5,000 Units Subcutaneous Q8H  . mirtazapine  7.5 mg Oral QHS  . multivitamin with minerals  1 tablet Oral Daily  . nicotine  14 mg Transdermal Daily  . sucralfate  1 g Oral TID WC & HS  . tamsulosin  0.4 mg Oral Daily  . thiamine  100 mg Oral Daily   Or  . thiamine  100 mg Intravenous Daily   Continuous Infusions: . cefTRIAXone (ROCEPHIN)  IV 1 g (01/15/19 0455)  .  sodium bicarbonate (isotonic) infusion in sterile water 75 mL/hr at 01/15/19 0454     LOS: 3 days     Georgette Shell, MD  If 7PM-7AM, please contact night-coverage [email or web address] Password [email or web address] 01/15/2019, 12:57 PM

## 2019-01-15 NOTE — Evaluation (Signed)
Physical Therapy Evaluation Patient Details Name: [name] MRN: [id number] DOB: [birth year] Today's Date: 01/15/2019   History of Present Illness  58 y.o. female with medical history significant of polysubstance (Cocaine, EtOH, and THC) abuse, HTN, anxiety and depression,recently admitted for UGI bleed secondary to gastritis and duodenal ulcers; discharged 10 days ago, presented with increasing N/V, NBNB diarrhea, worsening flank pain and dysuria.  Clinical Impression  Pt admitted with above diagnosis. Pt currently with functional limitations due to the deficits listed below (see PT Problem List). Pt will benefit from skilled PT to increase their independence and safety with mobility to allow discharge to the venue listed below.  Pt moving close to baseline, although using IV pole with ambulation.  Pt reports feeling depressed and was happy to get out of her room to ambulate.  Recommend nursing staff walk with her as able.  Will check back on pt next week if still in the hospital, but no follow up PT needed after d/c.     Follow Up Recommendations No PT follow up    Equipment Recommendations  None recommended by PT    Recommendations for Other Services       Precautions / Restrictions Precautions Precautions: None Restrictions Weight Bearing Restrictions: No      Mobility  Bed Mobility Overal bed mobility: Modified Independent                Transfers Overall transfer level: Modified independent                  Ambulation/Gait Ambulation/Gait assistance: Supervision Gait Distance (Feet): 300 Feet Assistive device: IV Pole Gait Pattern/deviations: Step-through pattern;Decreased stride length Gait velocity: decreased   General Gait Details: Pt able to ambulate with S and manage IV pole with decreased cadence  Stairs            Wheelchair Mobility    Modified Rankin (Stroke Patients Only)       Balance Overall balance assessment: No  apparent balance deficits (not formally assessed)                                           Pertinent Vitals/Pain Pain Assessment: 0-10 Pain Score: 2  Pain Location: upper abdomen Pain Descriptors / Indicators: Burning;Sharp Pain Intervention(s): Premedicated before session    Home Living Family/patient expects to be discharged to:: Private residence Living Arrangements: Spouse/significant other   Type of Home: Apartment Home Access: Level entry     Home Layout: One [place]: [place] - single point      Prior Function Level of Independence: Independent         Comments: Does not have a car and walks 1-2 miles to the store     [place]        Extremity/Trunk Assessment   Upper Extremity Assessment Upper Extremity Assessment: Overall WFL for tasks assessed    Lower Extremity Assessment Lower Extremity Assessment: Overall WFL for tasks assessed       Communication   Communication: No difficulties  Cognition Arousal/Alertness: Awake/alert Behavior During Therapy: WFL for tasks assessed/performed Overall Cognitive Status: Within Functional Limits for tasks assessed                                        General  Comments General comments (skin integrity, edema, etc.): pt instructed in basic LE ROM movements she can do while in chair.    Exercises     Assessment/Plan    PT Assessment Patient needs continued PT services  PT Problem List Decreased mobility;Decreased activity tolerance       PT Treatment Interventions DME instruction;Gait training;Functional mobility training;Therapeutic activities;Therapeutic exercise;Balance training    PT Goals (Current goals can be found in the Care Plan section)  Acute Rehab PT Goals Patient Stated Goal: go home PT Goal Formulation: With patient Time For Goal Achievement: 01/29/19    Frequency Min 2X/week   Barriers to discharge        Co-evaluation                AM-PAC PT "6 Clicks" Mobility  Outcome Measure Help needed turning from your back to your side while in a flat bed without using bedrails?: None Help needed moving from lying on your back to sitting on the side of a flat bed without using bedrails?: None Help needed moving to and from a bed to a chair (including a wheelchair)?: None Help needed standing up from a chair using your arms (e.g., wheelchair or bedside chair)?: None Help needed to walk in hospital room?: A Little Help needed climbing 3-5 steps with a railing? : A Little 6 Click Score: 22    End of Session Equipment Utilized During Treatment: Gait belt Activity Tolerance: Patient tolerated treatment well Patient left: in chair;with call bell/phone within reach Nurse Communication: Mobility status PT Visit Diagnosis: Unsteadiness on feet (R26.81);Muscle weakness (generalized) (M62.81)    Time: 2956-2130 PT Time Calculation (min) (ACUTE ONLY): 31 min   Charges:   PT Evaluation $PT Eval Low Complexity: 1 Low PT Treatments $Gait Training: 8-22 mins        Sun Wilensky L. Tamala Julian, [place] Pager [phone] 01/15/2019   Galen Manila 01/15/2019, 11:05 AM

## 2019-01-15 NOTE — Transfer of Care (Signed)
Immediate Anesthesia Transfer of Care Note  Patient: [name]  Procedure(s) Performed: CYSTOSCOPY WITH RETROGRADE PYELOGRAM, URETEROSCOPY AND STENT PLACEMENT (Left )  Patient Location: PACU  Anesthesia Type:General  Level of Consciousness: awake and patient cooperative  Airway & Oxygen Therapy: Patient Spontanous Breathing and Patient connected to face mask oxygen  Post-op Assessment: Report given to RN and Post -op Vital signs reviewed and stable  Post vital signs: Reviewed and stable  Last Vitals:  Vitals Value Taken Time  BP 148/92 01/15/19 1719  Temp    Pulse 80 01/15/19 1720  Resp 13 01/15/19 1720  SpO2 100 % 01/15/19 1720  Vitals shown include unvalidated device data.  Last Pain:  Vitals:   01/15/19 1507  TempSrc: Oral  PainSc: 2       Patients Stated Pain Goal: 3 (96/22/29 7989)  Complications: No apparent anesthesia complications

## 2019-01-15 NOTE — Care Management Important Message (Signed)
Important Message  Patient Details IM Letter given to Rhea Pink SW to present to the Patient Name: [name] MRN: [id number] Date of Birth: 03/08/1961   Medicare Important Message Given:  Yes     Kerin Salen 01/15/2019, 2:09 PM

## 2019-01-15 NOTE — Anesthesia Procedure Notes (Signed)
Procedure Name: LMA Insertion Date/Time: 01/15/2019 4:37 PM Performed by: Montel Clock, CRNA Pre-anesthesia Checklist: Patient identified, Emergency Drugs available, Suction available, Patient being monitored and Timeout performed Patient Re-evaluated:Patient Re-evaluated prior to induction Oxygen Delivery Method: Circle system utilized Preoxygenation: Pre-oxygenation with 100% oxygen Induction Type: IV induction LMA: LMA with gastric port inserted LMA Size: 4.0 Number of attempts: 1 Dental Injury: Teeth and Oropharynx as per pre-operative assessment

## 2019-01-16 DIAGNOSIS — N10 Acute tubulo-interstitial nephritis: Secondary | ICD-10-CM | POA: Diagnosis not present

## 2019-01-16 MED ORDER — ONDANSETRON HCL 4 MG PO TABS: 4.0000 mg | ORAL_TABLET | Freq: Four times a day (QID) | ORAL | 0 refills | Status: DC | PRN

## 2019-01-16 MED ORDER — NICOTINE 14 MG/24HR TD PT24: 14.0000 mg | MEDICATED_PATCH | Freq: Every day | TRANSDERMAL | 0 refills | Status: DC

## 2019-01-16 MED ORDER — TAMSULOSIN HCL 0.4 MG PO CAPS: 0.4000 mg | ORAL_CAPSULE | Freq: Every day | ORAL | 1 refills | Status: DC

## 2019-01-16 NOTE — Anesthesia Postprocedure Evaluation (Signed)
Anesthesia Post Note  Patient: [name]  Procedure(s) Performed: CYSTOSCOPY WITH RETROGRADE PYELOGRAM, URETEROSCOPY AND STENT PLACEMENT (Left )     Patient location during evaluation: PACU Anesthesia Type: General Level of consciousness: awake and alert Pain management: pain level controlled Vital Signs Assessment: post-procedure vital signs reviewed and stable Respiratory status: spontaneous breathing, nonlabored ventilation, respiratory function stable and patient connected to nasal cannula oxygen Cardiovascular status: blood pressure returned to baseline and stable Postop Assessment: no apparent nausea or vomiting Anesthetic complications: no    Last Vitals:  Vitals:   01/16/19 0147 01/16/19 0553  BP: 131/89 (!) 145/91  Pulse: 69 72  Resp: 16 16  Temp: 36.8 C 36.9 C  SpO2: 98% 100%    Last Pain:  Vitals:   01/16/19 0553  TempSrc: Oral  PainSc:                  Ryan P Ellender

## 2019-01-16 NOTE — Discharge Summary (Signed)
Physician Discharge Summary  [name] NID:[id number] DOB: 12/31/1960 DOA: 01/12/2019  PCP: Ladell Pier, MD  Admit date: 01/12/2019 Discharge date: 01/16/2019  Admitted From: Home Disposition: Home  Recommendations for Outpatient Follow-up:  1. Follow up with PCP in 1-2 weeks 2. Please obtain BMP/CBC in one week 3. Please follow up with urology   Home Health: None Equipment/Devices: None Discharge Condition: Stable CODE STATUS full Diet recommendation: Cardiac Brief/Interim Summary:58 y.o.femalewith medical history significant ofpolysubstance (Cocaine, EtOH, and THC) abuse, HTN, anxiety and depression,recently admitted for UGI bleed secondary to gastritis and duodenal ulcers; discharged 10 days ago, presented withincreasing N/V, NBNB diarrhea, worsening flank pain and dysuria. Concerned she may have a UTI.She had chest and back pain duringlastadmission. States that her chest and back pain improved while in the hospital, but never fully subsided. She is noting ongoing pain in her back which radiates down her legs. She does have a history of chronic back pain with sciatica. ED Course:Tm 100.66F, initially tachy to 110 and hypotensive to 76/57though this rapidly resolves with IVF.Creat 3.2 up from 2.5 on 10/16 which in turn is up from 2.1 earlier this year and 1.5 in Aug 2019.Found to have UTI and started on rocephin.    Discharge Diagnoses:  Principal Problem:   Acute pyelonephritis Active Problems:   History of cocaine abuse (HCC)   CKD (chronic kidney disease) stage 4, GFR 15-29 ml/min (HCC)   AKI (acute kidney injury) ([place])   Polysubstance abuse (HCC)   Metabolic acidosis with normal anion gap and failure of bicarbonate regeneration   Sepsis (HCC)   Substance induced mood disorder (HCC)   Polysubstance (including opioids) dependence with physiol dependence ([place])  1.Left-sided pyelonephritis with sepsis ruled out.Patient has a left ureteric stent  which was placed in 01/15/2019 by Dr. Gloriann Loan.  This can be removed on Monday.  If patient feels comfortable she may remove it by herself if not she can stop by the urology office and one of the nurses will pull it out for her.  She does not need any IV antibiotics on discharge as she received 5 days of IV Rocephin with urine culture with no growth.   2.AKI on chronic kidney disease: Appears to be improving and close to baseline nowRenal sonogram did not show any hydronephrosis but reported persistent left ureteric stent, medical renal disease bilaterally. Improved non-anion gap metabolic acidosiswith bicarb fluids.  3.Dyspepsia: Resume home medications including Carafate/PPI  4.Hypokalemia: Resolved.  5.Depressed mood:Seen by psych does not need inpatient psych admission recommends outpatient resources for polysubstance abuse.urine drug screen was positive for cocaine.  6.Polysubstance abuse:Needs outpatient follow-up  7 anemia of chronic disease hemoglobin down to 7.2 today no evidence of active bleeding.   Estimated body mass index is 21.39 kg/m as calculated from the following:   Height as of this encounter: 5\' 7"  (1.702 m).   Weight as of this encounter: 62 kg.  Discharge Instructions   Allergies as of 01/16/2019      Reactions   Lisinopril Other (See Comments)   hyperkalemia      Medication List    TAKE these medications   albuterol 108 (90 Base) MCG/ACT inhaler Commonly known as: Ventolin HFA INHALE 2 PUFFS INTO THE LUNGS EVERY 6 HOURS AS NEEDED FOR WHEEZING OR SHORTNESS OF BREATH.   amLODipine 10 MG tablet Commonly known as: NORVASC Take 1 tablet (10 mg total) by mouth daily.   atorvastatin 10 MG tablet Commonly known as: LIPITOR Take 0.5 tablets (5 mg  total) by mouth daily.   carvedilol 3.125 MG tablet Commonly known as: COREG Take 1 tablet (3.125 mg total) by mouth 2 (two) times daily with a meal.   cyanocobalamin 1000 MCG tablet Take  1 tablet (1,000 mcg total) by mouth daily.   ferrous sulfate 325 (65 FE) MG tablet Take 1 tablet (325 mg total) by mouth 2 (two) times daily with a meal.   folic acid 1 MG tablet Commonly known as: FOLVITE Take 1 tablet (1 mg total) by mouth daily.   multivitamin with minerals Tabs tablet Take 1 tablet by mouth daily.   nicotine 14 mg/24hr patch Commonly known as: NICODERM CQ - dosed in mg/24 hours Place 1 patch (14 mg total) onto the skin daily. Start taking on: January 17, 2019   ondansetron 4 MG tablet Commonly known as: ZOFRAN Take 1 tablet (4 mg total) by mouth every 6 (six) hours as needed for nausea.   pantoprazole 40 MG tablet Commonly known as: PROTONIX Take 1 tablet (40 mg total) by mouth 2 (two) times daily before a meal.   sucralfate 1 GM/10ML suspension Commonly known as: CARAFATE Take 10 mLs (1 g total) by mouth 4 (four) times daily -  with meals and at bedtime.   tamsulosin 0.4 MG Caps capsule Commonly known as: FLOMAX Take 1 capsule (0.4 mg total) by mouth daily. Start taking on: January 17, 2019   thiamine 100 MG tablet Take 1 tablet (100 mg total) by mouth daily.      Follow-up Information    Ladell Pier, MD Follow up.   Specialty: Internal Medicine Contact information: [place] [address] [phone]        Lucas Mallow, MD Follow up.   Specialty: Urology Contact information: [place] [id number] [phone]          Allergies  Allergen Reactions  . Lisinopril Other (See Comments)    hyperkalemia    Consultations: Urology  Procedures/Studies: Ct Abdomen Pelvis Wo Contrast  Result Date: 12/31/2018 CLINICAL DATA:  58 year old female with abdominal pain. EXAM: CT ABDOMEN AND PELVIS WITHOUT CONTRAST TECHNIQUE: Multidetector CT imaging of the abdomen and pelvis was performed following the standard protocol without IV contrast. COMPARISON:  CT of the abdomen pelvis dated  07/31/2018 FINDINGS: Evaluation of this exam is limited in the absence of intravenous contrast. Lower chest: Minimal right lung base atelectatic changes. The visualized lung bases are otherwise clear. No intra-abdominal free air or free fluid. Hepatobiliary: The liver is grossly unremarkable. No intrahepatic biliary ductal dilatation. No calcified gallstone or pericholecystic fluid. Pancreas: Unremarkable. No pancreatic ductal dilatation or surrounding inflammatory changes. Spleen: Normal in size without focal abnormality. Adrenals/Urinary Tract: The adrenal glands are unremarkable. There has been interval placement of a left-sided ureteral stent with proximal tip in the upper pole of the left kidney and distal end within the urinary bladder. No stone is identified within the left kidney or along the ureteral stent. There is inferior location of the right kidney in the lower abdomen. Irregular appearance of the right renal cortex consistent with scarring. This appearance is similar to prior CT of 07/31/2018 and CT of 09/16/2012. There is no hydronephrosis or nephrolithiasis on either side. The urinary bladder is unremarkable. Stomach/Bowel: There is no bowel obstruction or active inflammation. The appendix is normal. Vascular/Lymphatic: Advanced aortoiliac atherosclerotic disease. The IVC is unremarkable. No portal venous gas. There is no adenopathy. Reproductive: The uterus and ovaries are grossly unremarkable.  Other: None Musculoskeletal: Degenerative changes of the spine. No acute osseous pathology. IMPRESSION: 1. No acute intra-abdominal or pelvic pathology. No bowel obstruction or active inflammation. Normal appendix. 2. Interval placement of a left-sided ureteral stent. No hydronephrosis or nephrolithiasis. Aortic Atherosclerosis (ICD10-I70.0). Electronically Signed   By: Anner Crete M.D.   On: 12/31/2018 17:49   US Renal  Result Date: 01/12/2019 CLINICAL DATA:  Pyelonephritis EXAM: RENAL / URINARY  TRACT ULTRASOUND COMPLETE COMPARISON:  CT from 12 days ago FINDINGS: Right Kidney: Renal measurements: 7.5 x 4.3 x 5.4 cm = volume: 90 mL. Echogenic cortex. Malrotation with anteriorly positioned hilum. No hydronephrosis or mass. Left Kidney: Renal measurements: 8.4 x 5.1 x 5.7 cm = volume: 125 mL. Ovoid structure measured on study on CT is likely a parenchymal bar based on prior. Pelviectasis is also considered given close proximity of the patient's stent. Renal cortical lobulation and increased echogenicity. Bladder: Left ureteral stent seen within the bladder, likely encrusted based on CT. Otherwise negative IMPRESSION: 1. Left ureteral stent with no hydronephrosis. The stent is likely encrusted at the level of the bladder. 2. Bilateral medical renal disease with atrophy. Right renal malrotation. Electronically Signed   By: Monte Fantasia M.D.   On: 01/12/2019 05:10   Dg Chest Port 1 View  Result Date: 01/12/2019 CLINICAL DATA:  58 year old female with sepsis. EXAM: PORTABLE CHEST 1 VIEW COMPARISON:  Chest radiograph dated 07/31/2018 FINDINGS: The heart size and mediastinal contours are within normal limits. Both lungs are clear. The visualized skeletal structures are unremarkable. IMPRESSION: No active disease. Electronically Signed   By: Anner Crete M.D.   On: 01/12/2019 01:46   Dg Abdomen Acute W/chest  Result Date: 12/31/2018 CLINICAL DATA:  Abdominal pain. EXAM: DG ABDOMEN ACUTE W/ 1V CHEST COMPARISON:  CT 07/31/2018. FINDINGS: Double-J left ureteral stent noted good anatomic position. No definite left renal or upper ureteral stone identified. Tiny punctate calcific density noted adjacent to the distal aspect of the left ureteral stent. This could represent a tiny stone fragment. Pelvic vascular calcifications noted. No bowel distention. Stool noted throughout the colon. Degenerative changes scoliosis lumbar spine and both hips. IMPRESSION: Double-J left ureteral stent in good anatomic  position. No definite left renal or upper ureteral stone identified. Punctate calcification noted over the lower left pelvis adjacent to the stent, this could represent a tiny stone fragment. Electronically Signed   By: Marcello Moores  Register   On: 12/31/2018 15:28   Dg C-arm 1-60 Min-no Report  Result Date: 01/15/2019 Fluoroscopy was utilized by the requesting physician.  No radiographic interpretation.    (Echo, Carotid, EGD, Colonoscopy, ERCP)    Subjective:  Patient is resting in bed very anxious to go home does not want to stay over the weekend denies any nausea vomiting or diarrhea Discharge Exam: Vitals:   01/16/19 0147 01/16/19 0553  BP: 131/89 (!) 145/91  Pulse: 69 72  Resp: 16 16  Temp: 98.2 F (36.8 C) 98.5 F (36.9 C)  SpO2: 98% 100%   Vitals:   01/15/19 1827 01/15/19 2029 01/16/19 0147 01/16/19 0553  BP: (!) 155/93 132/84 131/89 (!) 145/91  Pulse: 74 84 69 72  Resp: 18 18 16 16   Temp: 98.1 F (36.7 C) 98.1 F (36.7 C) 98.2 F (36.8 C) 98.5 F (36.9 C)  TempSrc: Oral Oral Oral Oral  SpO2: 98% 100% 98% 100%  Weight:      Height:        General: Pt is alert, awake, not in acute  distress Cardiovascular: RRR, S1/S2 +, no rubs, no gallops Respiratory: CTA bilaterally, no wheezing, no rhonchi Abdominal: Soft, NT, ND, bowel sounds + Extremities: no edema, no cyanosis    The results of significant diagnostics from this hospitalization (including imaging, microbiology, ancillary and laboratory) are listed below for reference.     Microbiology: Recent Results (from the past 240 hour(s))  SARS CORONAVIRUS 2 (TAT 6-24 HRS) Nasopharyngeal Nasopharyngeal Swab     Status: None   Collection Time: 01/12/19  1:35 AM   Specimen: Nasopharyngeal Swab  Result Value Ref Range Status   SARS Coronavirus 2 NEGATIVE NEGATIVE Final    Comment: (NOTE) SARS-CoV-2 target nucleic acids are NOT DETECTED. The SARS-CoV-2 RNA is generally detectable in upper and lower respiratory  specimens during the acute phase of infection. Negative results do not preclude SARS-CoV-2 infection, do not rule out co-infections with other pathogens, and should not be used as the sole basis for treatment or other patient management decisions. Negative results must be combined with clinical observations, patient history, and epidemiological information. The expected result is Negative. Fact Sheet for Patients: SugarRoll.be Fact Sheet for Healthcare Providers: https://www.woods-mathews.com/ This test is not yet approved or cleared by the [place] FDA and  has been authorized for detection and/or diagnosis of SARS-CoV-2 by FDA under an Emergency Use Authorization (EUA). This EUA will remain  in effect (meaning this test can be used) for the duration of the COVID-19 declaration under Section 56 4(b)(1) of the Act, 21 U.S.C. section 360bbb-3(b)(1), unless the authorization is terminated or revoked sooner. Performed at [facility], [place] [address], [place], [place] [address]   Urine culture     Status: Abnormal   Collection Time: 01/12/19  2:14 AM   Specimen: Urine, Clean Catch  Result Value Ref Range Status   Specimen Description   Final    URINE, CLEAN CATCH Performed at [facility], [place] [address], [place], [place] [address]    Special Requests   Final    NONE Performed at [facility], [place] [address], [place], [place] [address]    Culture MULTIPLE SPECIES PRESENT, SUGGEST RECOLLECTION (A)  Final   Report Status 01/12/2019 FINAL  Final  Culture, blood (Routine x 2)     Status: None (Preliminary result)   Collection Time: 01/12/19  2:14 AM   Specimen: BLOOD  Result Value Ref Range Status   Specimen Description   Final    BLOOD RIGHT ANTECUBITAL Performed at [place] [address] [place], [place] [address]    Special Requests   Final    BOTTLES DRAWN  AEROBIC AND ANAEROBIC Blood Culture results may not be optimal due to an inadequate volume of blood received in culture bottles Performed at [place] [address] [place], [place] [address]    Culture   Final    NO GROWTH 4 DAYS Performed at [facility], [place] [address], [place], [place] [address]    Report Status PENDING  Incomplete  Culture, blood (Routine x 2)     Status: None (Preliminary result)   Collection Time: 01/12/19  2:14 AM   Specimen: BLOOD  Result Value Ref Range Status   Specimen Description   Final    BLOOD LEFT WRIST Performed at [place] [address] [place], [place] [address]    Special Requests   Final    BOTTLES DRAWN AEROBIC AND ANAEROBIC Blood Culture adequate volume  Performed at [facility], [place] [address], [place], [place] [address]    Culture   Final    NO GROWTH 4 DAYS Performed at [facility], [place] [address] [place], [place] [address]    Report Status PENDING  Incomplete  GI pathogen panel by PCR, stool     Status: Abnormal   Collection Time: 01/12/19  2:14 AM   Specimen: Urine, Clean Catch; Stool  Result Value Ref Range Status   Plesiomonas shigelloides NOT DETECTED NOT DETECTED Final   Yersinia enterocolitica NOT DETECTED NOT DETECTED Final   Vibrio NOT DETECTED NOT DETECTED Final   Enteropathogenic E coli NOT DETECTED NOT DETECTED Final   E coli (ETEC) LT/ST NOT DETECTED NOT DETECTED Final   E coli 6948 by PCR Not applicable NOT DETECTED Final   Cryptosporidium by PCR NOT DETECTED NOT DETECTED Final   Entamoeba histolytica NOT DETECTED NOT DETECTED Final   Adenovirus F 40/41 NOT DETECTED NOT DETECTED Final   Norovirus GI/GII NOT DETECTED NOT DETECTED Final   Sapovirus NOT DETECTED NOT DETECTED Final    Comment: (NOTE) Performed At: [facility] [place], [place] [id number] Rush Farmer MD [id number]:[id number]    Vibrio cholerae NOT  DETECTED NOT DETECTED Final   Campylobacter by PCR NOT DETECTED NOT DETECTED Final   Salmonella by PCR DETECTED (A) NOT DETECTED Final   E coli (STEC) NOT DETECTED NOT DETECTED Final   Enteroaggregative E coli NOT DETECTED NOT DETECTED Final   Shigella by PCR NOT DETECTED NOT DETECTED Final   Cyclospora cayetanensis NOT DETECTED NOT DETECTED Final   Astrovirus NOT DETECTED NOT DETECTED Final   G lamblia by PCR NOT DETECTED NOT DETECTED Final   Rotavirus A by PCR NOT DETECTED NOT DETECTED Final  C difficile quick scan w PCR reflex     Status: None   Collection Time: 01/12/19  2:14 AM   Specimen: Urine, Clean Catch; Stool  Result Value Ref Range Status   C Diff antigen NEGATIVE NEGATIVE Final   C Diff toxin NEGATIVE NEGATIVE Final   C Diff interpretation No C. difficile detected.  Final    Comment: Performed at [facility], [place] [address] [place], [place] [address]  Surgical pcr screen     Status: None   Collection Time: 01/15/19 10:41 AM   Specimen: Nasal Mucosa; Nasal Swab  Result Value Ref Range Status   MRSA, PCR NEGATIVE NEGATIVE Final   Staphylococcus aureus NEGATIVE NEGATIVE Final    Comment: (NOTE) The Xpert SA Assay (FDA approved for NASAL specimens in patients 36 years of age and older), is one component of a comprehensive surveillance program. It is not intended to diagnose infection nor to guide or monitor treatment. Performed at [facility], [place] [address], [place], [place] [address]      Labs: BNP (last 3 results) No results for input(s): BNP in the last 8760 hours. Basic Metabolic Panel: Recent Labs  Lab 01/12/19 0214 01/12/19 0600 01/12/19 1917 01/13/19 0359 01/14/19 0416 01/15/19 0346  NA 136  --  135 136 135 139  K 3.2*  --  2.9* 3.1* 3.2* 3.1*  CL 113*  --  106 106 99 97*  CO2 13*  --  18* 22 28 31   GLUCOSE 103*  --  143* 94 90 88  BUN 41*  --  35* 31* 27* 23*  CREATININE 3.28*  --  2.82* 2.74* 2.48* 2.41*   CALCIUM 8.8*  --  8.3* 7.8* 7.5* 8.0*  MG  --  1.2*  --   --   --   --   PHOS  --  2.3*  --   --   --   --    Liver Function Tests: Recent Labs  Lab 01/12/19 0214 01/15/19 0346  AST 20 35  ALT 16 16  ALKPHOS 38 55  BILITOT 0.3 0.2*  PROT 6.6 5.8*  ALBUMIN 2.7* 2.2*   Recent Labs  Lab 01/12/19 0214  LIPASE 23   No results for input(s): AMMONIA in the last 168 hours. CBC: Recent Labs  Lab 01/12/19 0214 01/13/19 0359 01/15/19 0346  WBC 11.2* 6.8 6.5  NEUTROABS 10.0*  --   --   HGB 8.3* 7.2* 7.2*  HCT 27.1* 22.6* 22.7*  MCV 97.8 95.8 95.8  PLT 340 295 262   Cardiac Enzymes: No results for input(s): CKTOTAL, CKMB, CKMBINDEX, TROPONINI in the last 168 hours. BNP: Invalid input(s): POCBNP CBG: No results for input(s): GLUCAP in the last 168 hours. D-Dimer No results for input(s): DDIMER in the last 72 hours. Hgb A1c No results for input(s): HGBA1C in the last 72 hours. Lipid Profile No results for input(s): CHOL, HDL, LDLCALC, TRIG, CHOLHDL, LDLDIRECT in the last 72 hours. Thyroid function studies No results for input(s): TSH, T4TOTAL, T3FREE, THYROIDAB in the last 72 hours.  Invalid input(s): FREET3 Anemia work up No results for input(s): VITAMINB12, FOLATE, FERRITIN, TIBC, IRON, RETICCTPCT in the last 72 hours. Urinalysis    Component Value Date/Time   COLORURINE AMBER (A) 01/12/2019 0214   APPEARANCEUR TURBID (A) 01/12/2019 0214   LABSPEC 1.013 01/12/2019 0214   PHURINE 7.0 01/12/2019 0214   GLUCOSEU NEGATIVE 01/12/2019 0214   HGBUR SMALL (A) 01/12/2019 0214   BILIRUBINUR NEGATIVE 01/12/2019 0214   KETONESUR NEGATIVE 01/12/2019 0214   PROTEINUR 100 (A) 01/12/2019 0214   UROBILINOGEN 0.2 03/02/2010 2027   NITRITE POSITIVE (A) 01/12/2019 0214   LEUKOCYTESUR LARGE (A) 01/12/2019 0214   Sepsis Labs Invalid input(s): PROCALCITONIN,  WBC,  LACTICIDVEN Microbiology Recent Results (from the past 240 hour(s))  SARS CORONAVIRUS 2 (TAT 6-24 HRS)  Nasopharyngeal Nasopharyngeal Swab     Status: None   Collection Time: 01/12/19  1:35 AM   Specimen: Nasopharyngeal Swab  Result Value Ref Range Status   SARS Coronavirus 2 NEGATIVE NEGATIVE Final    Comment: (NOTE) SARS-CoV-2 target nucleic acids are NOT DETECTED. The SARS-CoV-2 RNA is generally detectable in upper and lower respiratory specimens during the acute phase of infection. Negative results do not preclude SARS-CoV-2 infection, do not rule out co-infections with other pathogens, and should not be used as the sole basis for treatment or other patient management decisions. Negative results must be combined with clinical observations, patient history, and epidemiological information. The expected result is Negative. Fact Sheet for Patients: SugarRoll.be Fact Sheet for Healthcare Providers: https://www.woods-mathews.com/ This test is not yet approved or cleared by the [place] FDA and  has been authorized for detection and/or diagnosis of SARS-CoV-2 by FDA under an Emergency Use Authorization (EUA). This EUA will remain  in effect (meaning this test can be used) for the duration of the COVID-19 declaration under Section 56 4(b)(1) of the Act, 21 U.S.C. section 360bbb-3(b)(1), unless the authorization is terminated or revoked sooner. Performed at [facility], [place] [address] [place] [address], [place], [place] [address]   Urine culture     Status: Abnormal   Collection Time: 01/12/19  2:14 AM   Specimen: Urine, Clean  Catch  Result Value Ref Range Status   Specimen Description   Final    URINE, CLEAN CATCH Performed at [facility], [place] [address] [place], [place] [address]    Special Requests   Final    NONE Performed at [facility], [place] [address], [place], [place] [address]    Culture MULTIPLE SPECIES PRESENT, SUGGEST RECOLLECTION (A)  Final   Report Status 01/12/2019 FINAL  Final  Culture,  blood (Routine x 2)     Status: None (Preliminary result)   Collection Time: 01/12/19  2:14 AM   Specimen: BLOOD  Result Value Ref Range Status   Specimen Description   Final    BLOOD RIGHT ANTECUBITAL Performed at [place] [address], [place], [place] [address]    Special Requests   Final    BOTTLES DRAWN AEROBIC AND ANAEROBIC Blood Culture results may not be optimal due to an inadequate volume of blood received in culture bottles Performed at [place] [address], [place], [place] [address]    Culture   Final    NO GROWTH 4 DAYS Performed at [facility], [place] [address] [place], [place] [address]    Report Status PENDING  Incomplete  Culture, blood (Routine x 2)     Status: None (Preliminary result)   Collection Time: 01/12/19  2:14 AM   Specimen: BLOOD  Result Value Ref Range Status   Specimen Description   Final    BLOOD LEFT WRIST Performed at [place] [address], [place], [place] [address]    Special Requests   Final    BOTTLES DRAWN AEROBIC AND ANAEROBIC Blood Culture adequate volume Performed at [place] [address] [place], [place] [address]    Culture   Final    NO GROWTH 4 DAYS Performed at [facility], [place] [address], [place], [place] [address]    Report Status PENDING  Incomplete  GI pathogen panel by PCR, stool     Status: Abnormal   Collection Time: 01/12/19  2:14 AM   Specimen: Urine, Clean Catch; Stool  Result Value Ref Range Status   Plesiomonas shigelloides NOT DETECTED NOT DETECTED Final   Yersinia enterocolitica NOT DETECTED NOT DETECTED Final   Vibrio NOT DETECTED NOT DETECTED Final   Enteropathogenic E coli NOT DETECTED NOT DETECTED Final   E coli (ETEC) LT/ST NOT DETECTED NOT DETECTED Final   E coli 4656 by PCR Not applicable NOT DETECTED Final   Cryptosporidium by PCR NOT DETECTED NOT DETECTED Final   Entamoeba histolytica  NOT DETECTED NOT DETECTED Final   Adenovirus F 40/41 NOT DETECTED NOT DETECTED Final   Norovirus GI/GII NOT DETECTED NOT DETECTED Final   Sapovirus NOT DETECTED NOT DETECTED Final    Comment: (NOTE) Performed At: [facility] [place], [place] [id number] Rush Farmer MD FV:[id number]    Vibrio cholerae NOT DETECTED NOT DETECTED Final   Campylobacter by PCR NOT DETECTED NOT DETECTED Final   Salmonella by PCR DETECTED (A) NOT DETECTED Final   E coli (STEC) NOT DETECTED NOT DETECTED Final   Enteroaggregative E coli NOT DETECTED NOT DETECTED Final   Shigella by PCR NOT DETECTED NOT DETECTED Final   Cyclospora cayetanensis NOT DETECTED NOT DETECTED Final   Astrovirus NOT DETECTED NOT DETECTED Final   G lamblia by PCR NOT DETECTED NOT DETECTED Final   Rotavirus A by PCR NOT  DETECTED NOT DETECTED Final  C difficile quick scan w PCR reflex     Status: None   Collection Time: 01/12/19  2:14 AM   Specimen: Urine, Clean Catch; Stool  Result Value Ref Range Status   C Diff antigen NEGATIVE NEGATIVE Final   C Diff toxin NEGATIVE NEGATIVE Final   C Diff interpretation No C. difficile detected.  Final    Comment: Performed at [place] [facility] [place] [address], [place], [place] [address]  Surgical pcr screen     Status: None   Collection Time: 01/15/19 10:41 AM   Specimen: Nasal Mucosa; Nasal Swab  Result Value Ref Range Status   MRSA, PCR NEGATIVE NEGATIVE Final   Staphylococcus aureus NEGATIVE NEGATIVE Final    Comment: (NOTE) The Xpert SA Assay (FDA approved for NASAL specimens in patients 74 years of age and older), is one component of a comprehensive surveillance program. It is not intended to diagnose infection nor to guide or monitor treatment. Performed at [facility], [place] [address] [place], [place] [address]      Time coordinating discharge: 33 minutes  SIGNED:   Georgette Shell, MD  [facility] 01/16/2019, 12:52 PM Pager   If 7PM-7AM, please contact night-coverage [email or web address] Password [email or web address]

## 2019-01-17 ENCOUNTER — Encounter (HOSPITAL_COMMUNITY): Payer: Self-pay | Admitting: Urology

## 2019-01-17 LAB — CULTURE, BLOOD (ROUTINE X 2)
Culture: NO GROWTH
Culture: NO GROWTH
Special Requests: ADEQUATE

## 2019-01-18 MED FILL — ONDANSETRON HCL 4 MG TABLET: 4 | 5 days supply | Qty: 20 | Fill #0

## 2019-01-18 MED FILL — TAMSULOSIN HCL 0.4 MG CAP: 0.4 | 30 days supply | Qty: 30 | Fill #0

## 2019-01-18 MED FILL — NICOTINE 14 MG/24HR PATCH: 14 | 28 days supply | Qty: 28 | Fill #0

## 2019-02-01 ENCOUNTER — Ambulatory Visit: Payer: Medicare Other | Admitting: Internal Medicine

## 2019-07-16 ENCOUNTER — Encounter: Payer: Self-pay | Admitting: Internal Medicine

## 2019-07-16 ENCOUNTER — Other Ambulatory Visit: Payer: Self-pay | Admitting: Internal Medicine

## 2019-07-16 ENCOUNTER — Other Ambulatory Visit: Payer: Self-pay

## 2019-07-16 ENCOUNTER — Ambulatory Visit: Payer: Self-pay | Attending: Internal Medicine | Admitting: Internal Medicine

## 2019-07-16 VITALS — BP 174/95 | HR 91 | Temp 97.7°F | Resp 16 | Wt 128.0 lb

## 2019-07-16 DIAGNOSIS — D509 Iron deficiency anemia, unspecified: Secondary | ICD-10-CM

## 2019-07-16 DIAGNOSIS — K219 Gastro-esophageal reflux disease without esophagitis: Secondary | ICD-10-CM | POA: Insufficient documentation

## 2019-07-16 DIAGNOSIS — N184 Chronic kidney disease, stage 4 (severe): Secondary | ICD-10-CM

## 2019-07-16 DIAGNOSIS — F17200 Nicotine dependence, unspecified, uncomplicated: Secondary | ICD-10-CM

## 2019-07-16 DIAGNOSIS — I129 Hypertensive chronic kidney disease with stage 1 through stage 4 chronic kidney disease, or unspecified chronic kidney disease: Secondary | ICD-10-CM | POA: Insufficient documentation

## 2019-07-16 DIAGNOSIS — Z79899 Other long term (current) drug therapy: Secondary | ICD-10-CM | POA: Insufficient documentation

## 2019-07-16 DIAGNOSIS — Z833 Family history of diabetes mellitus: Secondary | ICD-10-CM | POA: Insufficient documentation

## 2019-07-16 DIAGNOSIS — G8929 Other chronic pain: Secondary | ICD-10-CM

## 2019-07-16 DIAGNOSIS — F17210 Nicotine dependence, cigarettes, uncomplicated: Secondary | ICD-10-CM | POA: Insufficient documentation

## 2019-07-16 DIAGNOSIS — J449 Chronic obstructive pulmonary disease, unspecified: Secondary | ICD-10-CM

## 2019-07-16 DIAGNOSIS — E785 Hyperlipidemia, unspecified: Secondary | ICD-10-CM

## 2019-07-16 DIAGNOSIS — Z8619 Personal history of other infectious and parasitic diseases: Secondary | ICD-10-CM | POA: Insufficient documentation

## 2019-07-16 DIAGNOSIS — F419 Anxiety disorder, unspecified: Secondary | ICD-10-CM | POA: Insufficient documentation

## 2019-07-16 DIAGNOSIS — Z87442 Personal history of urinary calculi: Secondary | ICD-10-CM | POA: Insufficient documentation

## 2019-07-16 DIAGNOSIS — I10 Essential (primary) hypertension: Secondary | ICD-10-CM

## 2019-07-16 DIAGNOSIS — M79604 Pain in right leg: Secondary | ICD-10-CM

## 2019-07-16 DIAGNOSIS — Z888 Allergy status to other drugs, medicaments and biological substances status: Secondary | ICD-10-CM | POA: Insufficient documentation

## 2019-07-16 DIAGNOSIS — F1910 Other psychoactive substance abuse, uncomplicated: Secondary | ICD-10-CM

## 2019-07-16 DIAGNOSIS — M79605 Pain in left leg: Secondary | ICD-10-CM

## 2019-07-16 MED ORDER — AMLODIPINE BESYLATE 10 MG PO TABS: 10.0000 mg | ORAL_TABLET | Freq: Every day | ORAL | 2 refills | Status: DC

## 2019-07-16 MED ORDER — FERROUS SULFATE 325 (65 FE) MG PO TABS: 325.0000 mg | ORAL_TABLET | Freq: Every day | ORAL | 3 refills | Status: DC

## 2019-07-16 MED ORDER — ADULT MULTIVITAMIN W/MINERALS CH: 1.0000 | ORAL_TABLET | Freq: Every day | ORAL | 1 refills | Status: DC

## 2019-07-16 MED ORDER — ATORVASTATIN CALCIUM 10 MG PO TABS: 5.0000 mg | ORAL_TABLET | Freq: Every day | ORAL | 3 refills | Status: DC

## 2019-07-16 MED ORDER — FAMOTIDINE 20 MG PO TABS: 20.0000 mg | ORAL_TABLET | Freq: Two times a day (BID) | ORAL | 2 refills | Status: DC

## 2019-07-16 MED ORDER — ALBUTEROL SULFATE HFA 108 (90 BASE) MCG/ACT IN AERS: INHALATION_SPRAY | RESPIRATORY_TRACT | 6 refills | Status: DC

## 2019-07-16 MED ORDER — GABAPENTIN 100 MG PO CAPS: 100.0000 mg | ORAL_CAPSULE | Freq: Three times a day (TID) | ORAL | 3 refills | Status: DC

## 2019-07-16 MED FILL — !VENTOLIN HFA INHALER: 108 (90 BAS | 25 days supply | Qty: 18 | Fill #0

## 2019-07-16 MED FILL — FERROUS SULFATE 325 MG TAB: 325 (65 FE) | 90 days supply | Qty: 90 | Fill #0

## 2019-07-16 MED FILL — AMLODIPINE BESYLATE 10 MG T: 10 | 30 days supply | Qty: 30 | Fill #0

## 2019-07-16 MED FILL — ATORVASTATIN 10 MG TABLET: 10 | 30 days supply | Qty: 30 | Fill #0

## 2019-07-16 MED FILL — GABAPENTIN 100 MG CAPSULE: 100 | 30 days supply | Qty: 90 | Fill #0

## 2019-07-16 MED FILL — FAMOTIDINE 20 MG TABS: 20 | 30 days supply | Qty: 60 | Fill #0
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
## 2019-07-16 NOTE — Progress Notes (Signed)
Pt states she is also having pain in b/l legs

## 2019-07-17 ENCOUNTER — Encounter: Payer: Self-pay | Admitting: Internal Medicine

## 2019-07-17 LAB — COMPREHENSIVE METABOLIC PANEL
ALT: 56 IU/L — ABNORMAL HIGH (ref 0–32)
AST: 143 IU/L — ABNORMAL HIGH (ref 0–40)
Albumin/Globulin Ratio: 1.4 (ref 1.2–2.2)
Albumin: 4.2 g/dL (ref 3.8–4.9)
Alkaline Phosphatase: 101 IU/L (ref 39–117)
BUN/Creatinine Ratio: 6 — ABNORMAL LOW (ref 9–23)
BUN: 17 mg/dL (ref 6–24)
Bilirubin Total: 0.4 mg/dL (ref 0.0–1.2)
CO2: 15 mmol/L — ABNORMAL LOW (ref 20–29)
Calcium: 9.6 mg/dL (ref 8.7–10.2)
Chloride: 97 mmol/L (ref 96–106)
Creatinine, Ser: 2.68 mg/dL — ABNORMAL HIGH (ref 0.57–1.00)
GFR calc Af Amer: 22 mL/min/{1.73_m2} — ABNORMAL LOW (ref >59)
GFR calc non Af Amer: 19 mL/min/{1.73_m2} — ABNORMAL LOW (ref >59)
Globulin, Total: 3.1 g/dL (ref 1.5–4.5)
Glucose: 112 mg/dL — ABNORMAL HIGH (ref 65–99)
Potassium: 3.9 mmol/L (ref 3.5–5.2)
Sodium: 130 mmol/L — ABNORMAL LOW (ref 134–144)
Total Protein: 7.3 g/dL (ref 6.0–8.5)

## 2019-07-17 LAB — CBC
Hematocrit: 30 % — ABNORMAL LOW (ref 34.0–46.6)
Hemoglobin: 10.5 g/dL — ABNORMAL LOW (ref 11.1–15.9)
MCH: 34.2 pg — ABNORMAL HIGH (ref 26.6–33.0)
MCHC: 35 g/dL (ref 31.5–35.7)
MCV: 98 fL — ABNORMAL HIGH (ref 79–97)
Platelets: 257 10*3/uL (ref 150–450)
RBC: 3.07 x10E6/uL — ABNORMAL LOW (ref 3.77–5.28)
RDW: 14.5 % (ref 11.7–15.4)
WBC: 4.6 10*3/uL (ref 3.4–10.8)

## 2019-07-17 LAB — LIPID PANEL
Chol/HDL Ratio: 1.5 ratio (ref 0.0–4.4)
Cholesterol, Total: 282 mg/dL — ABNORMAL HIGH (ref 100–199)
HDL: 186 mg/dL (ref >39)
LDL Chol Calc (NIH): 87 mg/dL (ref 0–99)
Triglycerides: 59 mg/dL (ref 0–149)
VLDL Cholesterol Cal: 9 mg/dL (ref 5–40)

## 2019-07-17 LAB — VITAMIN B12: Vitamin B-12: 540 pg/mL (ref 232–1245)

## 2019-07-17 NOTE — Progress Notes (Signed)
Let patient know that she is still anemic.  She should continue the iron supplement and a multivitamin tablet.  Her kidney function is not 100%.  I have referred her to a kidney specialist.  2 of her liver enzymes are elevated most likely due to excess alcohol use.  Encouraged her to cut back on drinking alcoholic beverages to help prevent further damage to her kidney.  Her total cholesterol is elevated but I want her to hold off on taking the medicine called Atorvastatin for now due to her abnormal liver function tests.  Her vitamin B12 level is normal.  If you are unable to reach her by phone please send her a letter.

## 2019-07-21 ENCOUNTER — Telehealth: Payer: Self-pay

## 2019-07-21 NOTE — Telephone Encounter (Signed)
Contacted pt to go over lab results pt didn't answer lvm asking pt to give a call back at her earliest convienence

## 2019-08-10 MED FILL — FERROUS SULFATE 325 MG TAB: 325 (65 FE) | 90 days supply | Qty: 90 | Fill #0

## 2019-08-10 MED FILL — GABAPENTIN 100 MG CAPSULE: 100 | 30 days supply | Qty: 90 | Fill #0

## 2019-08-10 MED FILL — !VENTOLIN HFA INHALER: 108 (90 BAS | 25 days supply | Qty: 18 | Fill #0

## 2019-08-10 MED FILL — ?ATORVASTATIN 10 MG TABLET: 10 | 30 days supply | Qty: 15 | Fill #0

## 2019-08-10 MED FILL — ?AMLODIPINE BESYL 10MG TABL: 10 | 30 days supply | Qty: 30 | Fill #0

## 2019-08-10 MED FILL — FAMOTIDINE 20 MG TABS: 20 | 30 days supply | Qty: 60 | Fill #0

## 2019-08-15 IMAGING — CT CT ABDOMEN AND PELVIS WITHOUT CONTRAST
2 of 4 series · 16 of 46 positions shown, 18 images · non-contrast
Comparison: None.

CLINICAL DATA: Left side abdominal pain for 3 weeks.

EXAM:
CT ABDOMEN AND PELVIS WITHOUT CONTRAST
TECHNIQUE: Multidetector CT imaging of the abdomen and pelvis was performed
following the standard protocol without IV contrast.

[Series 2: axial st · axial · 0.83mm/px · z∈[-747,-342]mm · 13 of 95 slices shown, 15 images]
[im 7/95  soft-tissue]
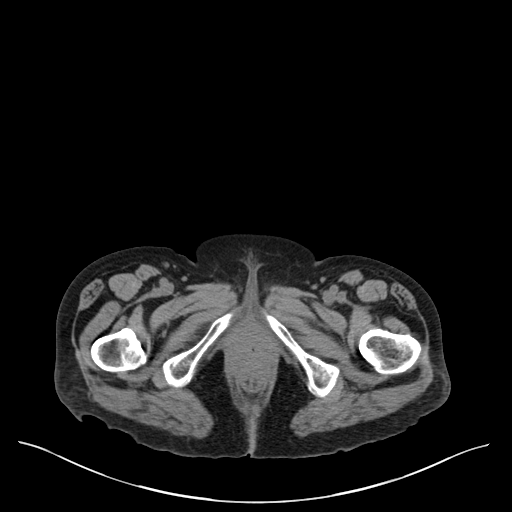
[im 7/95  bone]
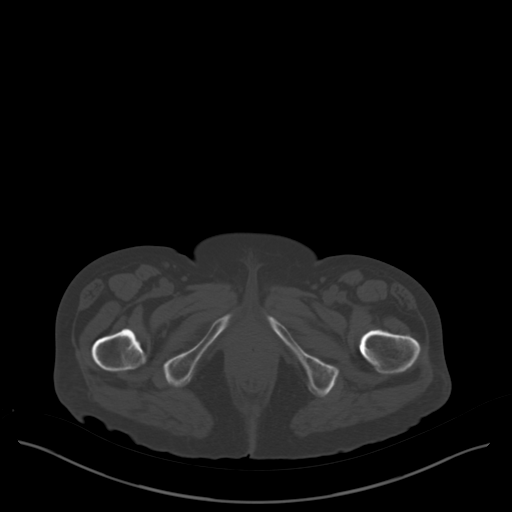
[im 13/95  soft-tissue]
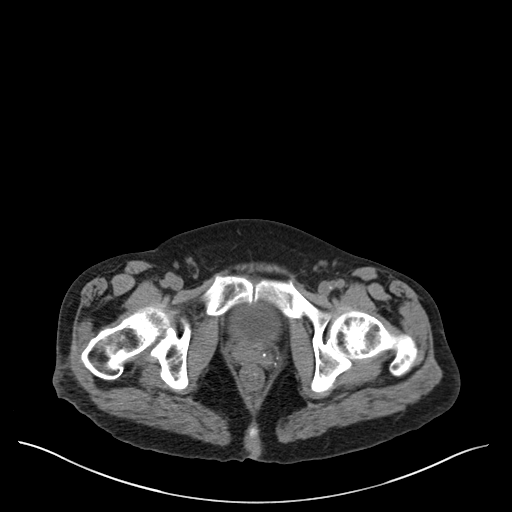
[im 19/95  soft-tissue]
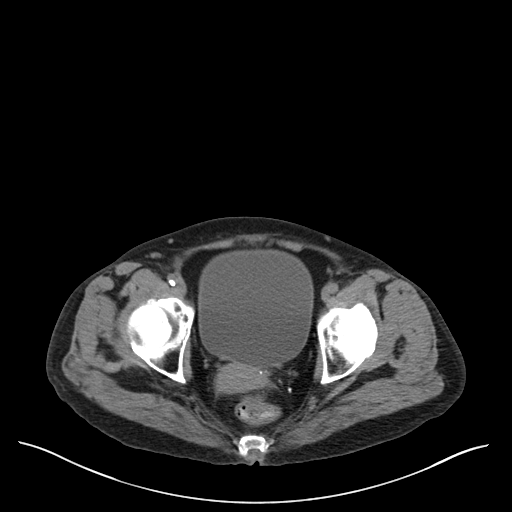
[im 26/95  soft-tissue]
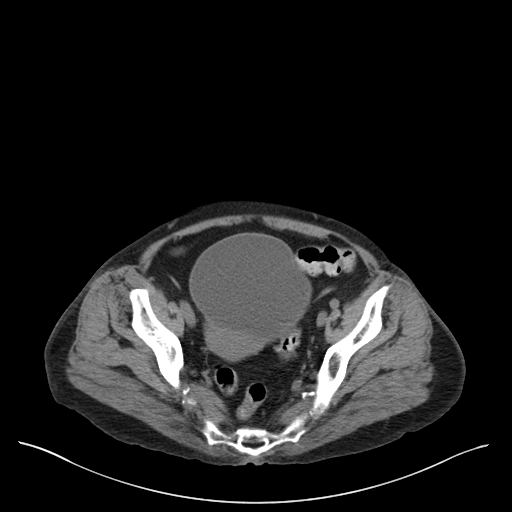
[im 32/95  soft-tissue]
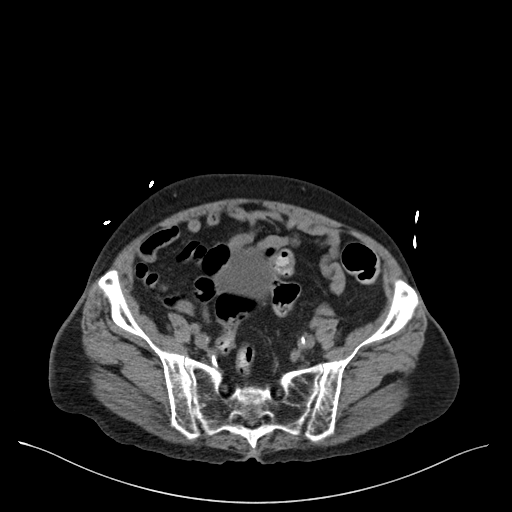
[im 38/95  soft-tissue]
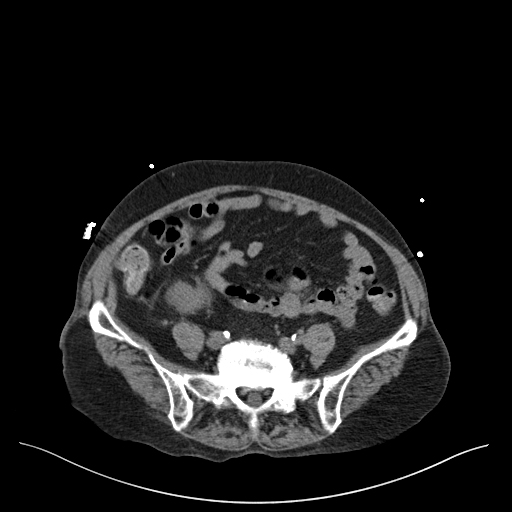
[im 51/95  soft-tissue]
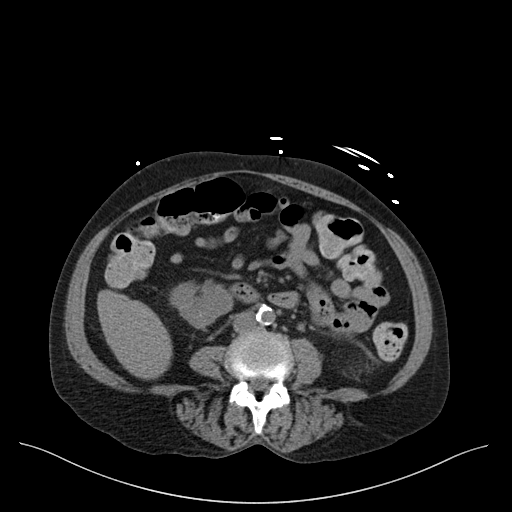
[im 57/95  soft-tissue]
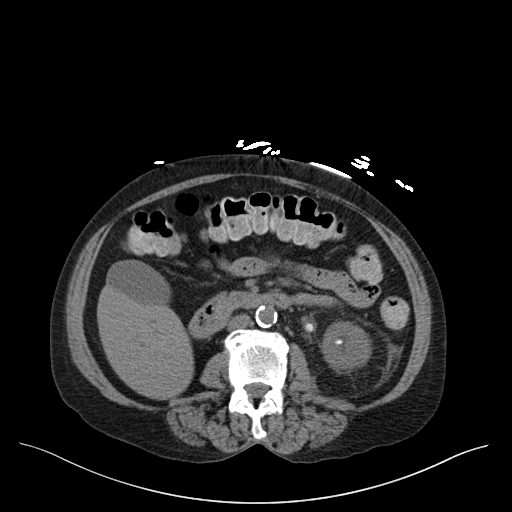
[im 63/95  soft-tissue]
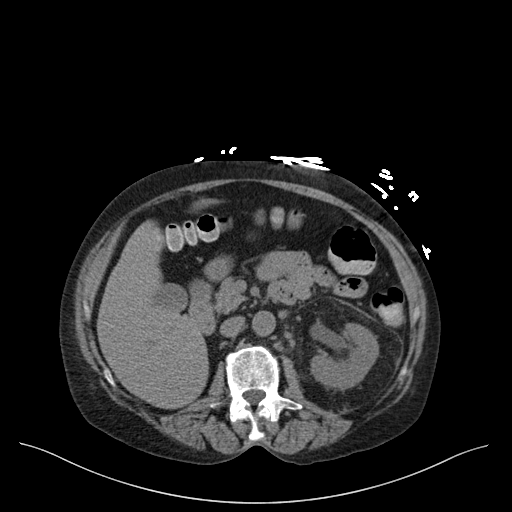
[im 63/95  bone]
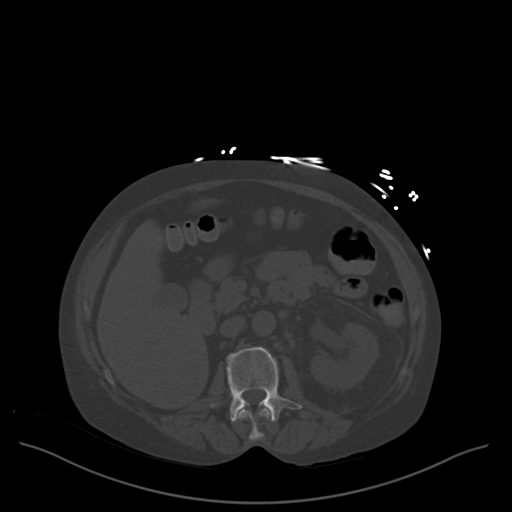
[im 69/95  soft-tissue]
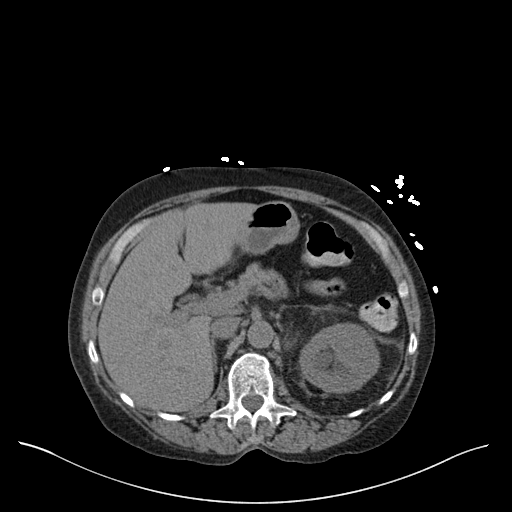
[im 76/95  soft-tissue]
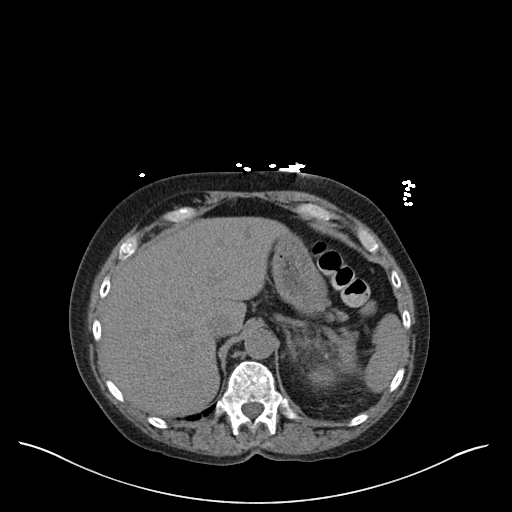
[im 82/95  soft-tissue]
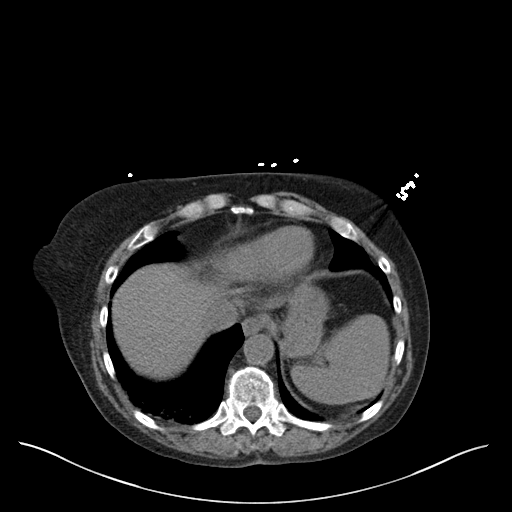
[im 88/95  soft-tissue]
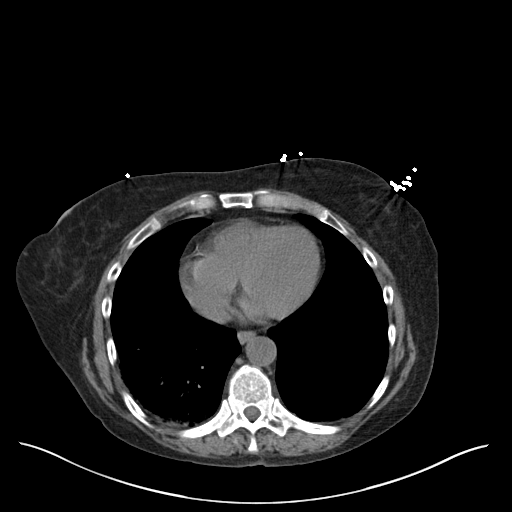

[Series 5: coronal st · coronal · 0.77mm/px · 3 of 131 slices shown]
[im 44/131  soft-tissue]
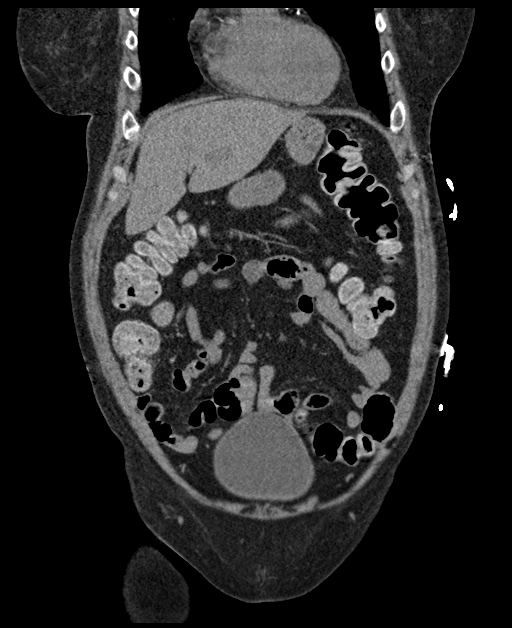
[im 58/131  soft-tissue]
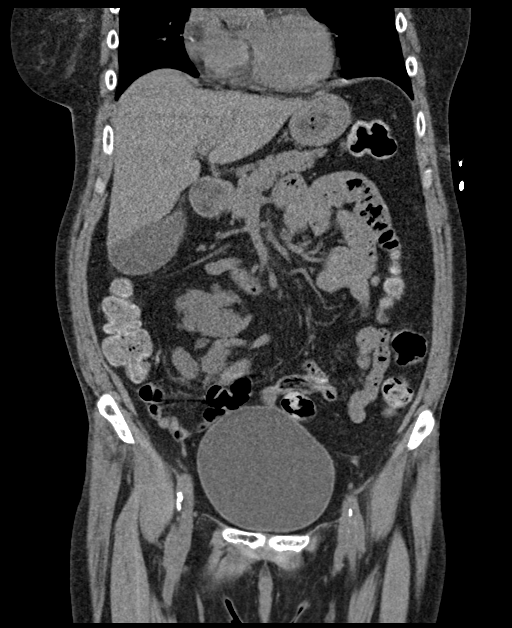
[im 73/131  soft-tissue]
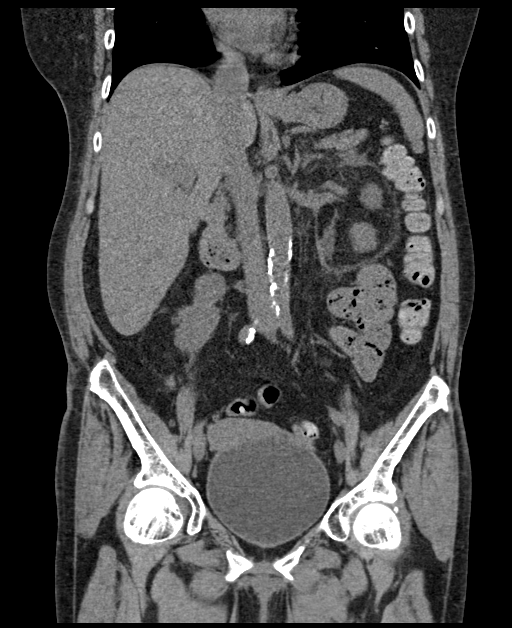

[16 of 46 positions shown; findings below may reference images not displayed]

FINDINGS: Lower chest: Dependent atelectasis is identified. There is no
pleural or pericardial effusion.

Hepatobiliary: Mild fatty infiltration of the liver without focal
lesion. Gallbladder and biliary tree are unremarkable.

Pancreas: Unremarkable. No pancreatic ductal dilatation or
surrounding inflammatory changes.

Spleen: Normal in size without focal abnormality.

Adrenals/Urinary Tract: The patient has moderate left hydronephrosis
due to a 0.6 cm stone just below the renal pelvis. 2 nonobstructing
stones are seen in the left kidney measuring 0.5 cm in the lower
pole and 1.1 cm in the upper pole. There is extensive stranding
about the left kidney and proximal ureter. The right kidney is
ptotic with an abnormal axis of orientation. The pelvis is directed
anteriorly. Duplicated right renal collecting system is noted. Two
nonobstructing stones are seen in the right kidney. The larger
measures 0.5 cm. Urinary bladder is unremarkable. The adrenal glands
appear normal.

Stomach/Bowel: Stomach is within normal limits. Appendix appears
normal. No evidence of bowel wall thickening, distention, or
inflammatory changes.

Vascular/Lymphatic: Aortic atherosclerosis. No enlarged abdominal or
pelvic lymph nodes.

Reproductive: Uterus and bilateral adnexa are unremarkable.

Other: The none.

Musculoskeletal: No acute or focal abnormality.
IMPRESSION: Moderate left hydronephrosis due to a 0.6 cm stone just below the
renal pelvis. The patient has 2 additional nonobstructing stone in
the left kidney.

Ptotic right kidney with an abnormal axis of orientation and
duplicated renal collecting system as seen on prior CT. Two
nonobstructing stones are present in the right kidney.

Fatty infiltration liver.

Atherosclerosis.

## 2019-08-19 ENCOUNTER — Emergency Department (HOSPITAL_COMMUNITY): Payer: Medicare Other

## 2019-08-19 ENCOUNTER — Inpatient Hospital Stay (HOSPITAL_COMMUNITY)
Admission: EM | Admit: 2019-08-19 | Discharge: 2019-08-23 | DRG: 640 | Disposition: A | Discharge status: Home / Self Care | Payer: Medicare Other | Attending: Internal Medicine | Admitting: Internal Medicine

## 2019-08-19 ENCOUNTER — Inpatient Hospital Stay (HOSPITAL_COMMUNITY): Payer: Medicare Other

## 2019-08-19 ENCOUNTER — Encounter (HOSPITAL_COMMUNITY): Payer: Self-pay

## 2019-08-19 ENCOUNTER — Other Ambulatory Visit: Payer: Self-pay

## 2019-08-19 DIAGNOSIS — F1210 Cannabis abuse, uncomplicated: Secondary | ICD-10-CM | POA: Diagnosis present

## 2019-08-19 DIAGNOSIS — G8929 Other chronic pain: Secondary | ICD-10-CM | POA: Diagnosis present

## 2019-08-19 DIAGNOSIS — B182 Chronic viral hepatitis C: Secondary | ICD-10-CM | POA: Diagnosis present

## 2019-08-19 DIAGNOSIS — E876 Hypokalemia: Secondary | ICD-10-CM | POA: Diagnosis present

## 2019-08-19 DIAGNOSIS — J449 Chronic obstructive pulmonary disease, unspecified: Secondary | ICD-10-CM | POA: Diagnosis present

## 2019-08-19 DIAGNOSIS — E8779 Other fluid overload: Secondary | ICD-10-CM | POA: Diagnosis present

## 2019-08-19 DIAGNOSIS — Z833 Family history of diabetes mellitus: Secondary | ICD-10-CM | POA: Diagnosis not present

## 2019-08-19 DIAGNOSIS — K922 Gastrointestinal hemorrhage, unspecified: Secondary | ICD-10-CM | POA: Diagnosis present

## 2019-08-19 DIAGNOSIS — F1410 Cocaine abuse, uncomplicated: Secondary | ICD-10-CM | POA: Diagnosis present

## 2019-08-19 DIAGNOSIS — M5440 Lumbago with sciatica, unspecified side: Secondary | ICD-10-CM | POA: Diagnosis present

## 2019-08-19 DIAGNOSIS — I10 Essential (primary) hypertension: Secondary | ICD-10-CM | POA: Diagnosis present

## 2019-08-19 DIAGNOSIS — F17210 Nicotine dependence, cigarettes, uncomplicated: Secondary | ICD-10-CM | POA: Diagnosis present

## 2019-08-19 DIAGNOSIS — F1411 Cocaine abuse, in remission: Secondary | ICD-10-CM | POA: Diagnosis present

## 2019-08-19 DIAGNOSIS — B9620 Unspecified Escherichia coli [E. coli] as the cause of diseases classified elsewhere: Secondary | ICD-10-CM | POA: Diagnosis present

## 2019-08-19 DIAGNOSIS — K2971 Gastritis, unspecified, with bleeding: Secondary | ICD-10-CM | POA: Diagnosis present

## 2019-08-19 DIAGNOSIS — D509 Iron deficiency anemia, unspecified: Secondary | ICD-10-CM

## 2019-08-19 DIAGNOSIS — I129 Hypertensive chronic kidney disease with stage 1 through stage 4 chronic kidney disease, or unspecified chronic kidney disease: Secondary | ICD-10-CM | POA: Diagnosis present

## 2019-08-19 DIAGNOSIS — B1920 Unspecified viral hepatitis C without hepatic coma: Secondary | ICD-10-CM | POA: Diagnosis present

## 2019-08-19 DIAGNOSIS — Z20822 Contact with and (suspected) exposure to covid-19: Secondary | ICD-10-CM | POA: Diagnosis present

## 2019-08-19 DIAGNOSIS — E872 Acidosis: Secondary | ICD-10-CM | POA: Diagnosis present

## 2019-08-19 DIAGNOSIS — F1910 Other psychoactive substance abuse, uncomplicated: Secondary | ICD-10-CM | POA: Diagnosis present

## 2019-08-19 DIAGNOSIS — F1010 Alcohol abuse, uncomplicated: Secondary | ICD-10-CM | POA: Diagnosis present

## 2019-08-19 DIAGNOSIS — D539 Nutritional anemia, unspecified: Secondary | ICD-10-CM | POA: Diagnosis present

## 2019-08-19 DIAGNOSIS — N184 Chronic kidney disease, stage 4 (severe): Secondary | ICD-10-CM | POA: Diagnosis present

## 2019-08-19 DIAGNOSIS — K269 Duodenal ulcer, unspecified as acute or chronic, without hemorrhage or perforation: Secondary | ICD-10-CM | POA: Diagnosis present

## 2019-08-19 DIAGNOSIS — D62 Acute posthemorrhagic anemia: Secondary | ICD-10-CM | POA: Diagnosis present

## 2019-08-19 DIAGNOSIS — K259 Gastric ulcer, unspecified as acute or chronic, without hemorrhage or perforation: Secondary | ICD-10-CM | POA: Diagnosis present

## 2019-08-19 DIAGNOSIS — E871 Hypo-osmolality and hyponatremia: Principal | ICD-10-CM

## 2019-08-19 DIAGNOSIS — N3090 Cystitis, unspecified without hematuria: Secondary | ICD-10-CM | POA: Diagnosis present

## 2019-08-19 DIAGNOSIS — K2100 Gastro-esophageal reflux disease with esophagitis, without bleeding: Secondary | ICD-10-CM | POA: Diagnosis present

## 2019-08-19 HISTORY — DX: Gastrointestinal hemorrhage, unspecified: K92.2

## 2019-08-19 LAB — CBC
HCT: 22.1 % — ABNORMAL LOW (ref 36.0–46.0)
HCT: 21.7 % — ABNORMAL LOW (ref 36.0–46.0)
Hemoglobin: 7.8 g/dL — ABNORMAL LOW (ref 12.0–15.0)
Hemoglobin: 7.5 g/dL — ABNORMAL LOW (ref 12.0–15.0)
MCH: 34.1 pg — ABNORMAL HIGH (ref 26.0–34.0)
MCH: 33.3 pg (ref 26.0–34.0)
MCHC: 35.3 g/dL (ref 30.0–36.0)
MCHC: 34.6 g/dL (ref 30.0–36.0)
MCV: 96.5 fL (ref 80.0–100.0)
MCV: 96.4 fL (ref 80.0–100.0)
Platelets: 143 10*3/uL — ABNORMAL LOW (ref 150–400)
Platelets: 152 10*3/uL (ref 150–400)
RBC: 2.29 MIL/uL — ABNORMAL LOW (ref 3.87–5.11)
RBC: 2.25 MIL/uL — ABNORMAL LOW (ref 3.87–5.11)
RDW: 13.1 % (ref 11.5–15.5)
RDW: 13 % (ref 11.5–15.5)
WBC: 3.2 10*3/uL — ABNORMAL LOW (ref 4.0–10.5)
WBC: 2.6 10*3/uL — ABNORMAL LOW (ref 4.0–10.5)
nRBC: 0 % (ref 0.0–0.2)
nRBC: 0 % (ref 0.0–0.2)

## 2019-08-19 LAB — POC OCCULT BLOOD, ED: Fecal Occult Bld: POSITIVE — AB

## 2019-08-19 LAB — HIV ANTIBODY (ROUTINE TESTING W REFLEX)
HIV Screen 4th Generation wRfx: NONREACTIVE
HIV Screen 4th Generation wRfx: NONREACTIVE

## 2019-08-19 LAB — COMPREHENSIVE METABOLIC PANEL
ALT: 26 U/L (ref 0–44)
ALT: 25 U/L (ref 0–44)
AST: 67 U/L — ABNORMAL HIGH (ref 15–41)
AST: 50 U/L — ABNORMAL HIGH (ref 15–41)
Albumin: 3.4 g/dL — ABNORMAL LOW (ref 3.5–5.0)
Albumin: 3.1 g/dL — ABNORMAL LOW (ref 3.5–5.0)
Alkaline Phosphatase: 79 U/L (ref 38–126)
Alkaline Phosphatase: 77 U/L (ref 38–126)
Anion gap: 15 (ref 5–15)
Anion gap: 13 (ref 5–15)
BUN: 19 mg/dL (ref 6–20)
BUN: 17 mg/dL (ref 6–20)
CO2: 13 mmol/L — ABNORMAL LOW (ref 22–32)
CO2: 16 mmol/L — ABNORMAL LOW (ref 22–32)
Calcium: 8.9 mg/dL (ref 8.9–10.3)
Calcium: 8.7 mg/dL — ABNORMAL LOW (ref 8.9–10.3)
Chloride: 90 mmol/L — ABNORMAL LOW (ref 98–111)
Chloride: 97 mmol/L — ABNORMAL LOW (ref 98–111)
Creatinine, Ser: 2.33 mg/dL — ABNORMAL HIGH (ref 0.44–1.00)
Creatinine, Ser: 2.47 mg/dL — ABNORMAL HIGH (ref 0.44–1.00)
GFR calc Af Amer: 26 mL/min — ABNORMAL LOW (ref >60)
GFR calc Af Amer: 24 mL/min — ABNORMAL LOW (ref >60)
GFR calc non Af Amer: 22 mL/min — ABNORMAL LOW (ref >60)
GFR calc non Af Amer: 21 mL/min — ABNORMAL LOW (ref >60)
Glucose, Bld: 116 mg/dL — ABNORMAL HIGH (ref 70–99)
Glucose, Bld: 132 mg/dL — ABNORMAL HIGH (ref 70–99)
Potassium: 4.3 mmol/L (ref 3.5–5.1)
Potassium: 3.5 mmol/L (ref 3.5–5.1)
Sodium: 118 mmol/L — CL (ref 135–145)
Sodium: 126 mmol/L — ABNORMAL LOW (ref 135–145)
Total Bilirubin: 0.3 mg/dL (ref 0.3–1.2)
Total Bilirubin: 0.4 mg/dL (ref 0.3–1.2)
Total Protein: 7.2 g/dL (ref 6.5–8.1)
Total Protein: 6.7 g/dL (ref 6.5–8.1)

## 2019-08-19 LAB — BASIC METABOLIC PANEL
Anion gap: 13 (ref 5–15)
BUN: 18 mg/dL (ref 6–20)
CO2: 14 mmol/L — ABNORMAL LOW (ref 22–32)
Calcium: 8.6 mg/dL — ABNORMAL LOW (ref 8.9–10.3)
Chloride: 98 mmol/L (ref 98–111)
Creatinine, Ser: 2.43 mg/dL — ABNORMAL HIGH (ref 0.44–1.00)
GFR calc Af Amer: 25 mL/min — ABNORMAL LOW (ref >60)
GFR calc non Af Amer: 21 mL/min — ABNORMAL LOW (ref >60)
Glucose, Bld: 128 mg/dL — ABNORMAL HIGH (ref 70–99)
Potassium: 3.8 mmol/L (ref 3.5–5.1)
Sodium: 125 mmol/L — ABNORMAL LOW (ref 135–145)

## 2019-08-19 LAB — I-STAT CHEM 8, ED
BUN: 19 mg/dL (ref 6–20)
Calcium, Ion: 1.19 mmol/L (ref 1.15–1.40)
Chloride: 91 mmol/L — ABNORMAL LOW (ref 98–111)
Creatinine, Ser: 2.6 mg/dL — ABNORMAL HIGH (ref 0.44–1.00)
Glucose, Bld: 103 mg/dL — ABNORMAL HIGH (ref 70–99)
HCT: 23 % — ABNORMAL LOW (ref 36.0–46.0)
Hemoglobin: 7.8 g/dL — ABNORMAL LOW (ref 12.0–15.0)
Potassium: 4.2 mmol/L (ref 3.5–5.1)
Sodium: 117 mmol/L — CL (ref 135–145)
TCO2: 16 mmol/L — ABNORMAL LOW (ref 22–32)

## 2019-08-19 LAB — OSMOLALITY: Osmolality: 259 mOsm/kg — ABNORMAL LOW (ref 275–295)

## 2019-08-19 LAB — URINALYSIS, ROUTINE W REFLEX MICROSCOPIC
Bilirubin Urine: NEGATIVE
Glucose, UA: NEGATIVE mg/dL
Ketones, ur: NEGATIVE mg/dL
Nitrite: POSITIVE — AB
Protein, ur: 100 mg/dL — AB
Specific Gravity, Urine: 1.002 — ABNORMAL LOW (ref 1.005–1.030)
pH: 6 (ref 5.0–8.0)

## 2019-08-19 LAB — PHOSPHORUS: Phosphorus: 3.5 mg/dL (ref 2.5–4.6)

## 2019-08-19 LAB — RETICULOCYTES
Immature Retic Fract: 16.4 % — ABNORMAL HIGH (ref 2.3–15.9)
RBC.: 2.23 MIL/uL — ABNORMAL LOW (ref 3.87–5.11)
Retic Count, Absolute: 53.3 10*3/uL (ref 19.0–186.0)
Retic Ct Pct: 2.4 % (ref 0.4–3.1)

## 2019-08-19 LAB — IRON AND TIBC
Iron: 32 ug/dL (ref 28–170)
Saturation Ratios: 8 % — ABNORMAL LOW (ref 10.4–31.8)
TIBC: 396 ug/dL (ref 250–450)
UIBC: 364 ug/dL

## 2019-08-19 LAB — SARS CORONAVIRUS 2 BY RT PCR (HOSPITAL ORDER, PERFORMED IN ~~LOC~~ HOSPITAL LAB): SARS Coronavirus 2: NEGATIVE

## 2019-08-19 LAB — TROPONIN I (HIGH SENSITIVITY)
Troponin I (High Sensitivity): 6 ng/L (ref <18)
Troponin I (High Sensitivity): 6 ng/L (ref <18)

## 2019-08-19 LAB — FERRITIN: Ferritin: 35 ng/mL (ref 11–307)

## 2019-08-19 LAB — BRAIN NATRIURETIC PEPTIDE
B Natriuretic Peptide: 136.4 pg/mL — ABNORMAL HIGH (ref 0.0–100.0)
B Natriuretic Peptide: 249.4 pg/mL — ABNORMAL HIGH (ref 0.0–100.0)

## 2019-08-19 LAB — FOLATE: Folate: 9.9 ng/mL (ref >5.9)

## 2019-08-19 LAB — VITAMIN B12: Vitamin B-12: 388 pg/mL (ref 180–914)

## 2019-08-19 LAB — TSH: TSH: 1.951 u[IU]/mL (ref 0.350–4.500)

## 2019-08-19 LAB — SODIUM, URINE, RANDOM: Sodium, Ur: <10 mmol/L

## 2019-08-19 LAB — OSMOLALITY, URINE: Osmolality, Ur: 76 mOsm/kg — ABNORMAL LOW (ref 300–900)

## 2019-08-19 LAB — MAGNESIUM: Magnesium: 1.7 mg/dL (ref 1.7–2.4)

## 2019-08-19 MED ORDER — SODIUM CHLORIDE 0.9% FLUSH: 3.0000 mL | INTRAVENOUS | Status: DC | PRN

## 2019-08-19 MED ORDER — ONDANSETRON HCL 4 MG/2ML IJ SOLN: 4.0000 mg | Freq: Four times a day (QID) | INTRAMUSCULAR | Status: DC | PRN

## 2019-08-19 MED ORDER — ONDANSETRON HCL 4 MG PO TABS: 4.0000 mg | ORAL_TABLET | Freq: Four times a day (QID) | ORAL | Status: DC | PRN

## 2019-08-19 MED ORDER — BOOST / RESOURCE BREEZE PO LIQD CUSTOM
1.0000 | Freq: Three times a day (TID) | ORAL | Status: DC
  Administered 2019-08-20 – 2019-08-23 (×9): 1 via ORAL

## 2019-08-19 MED ORDER — NICOTINE 7 MG/24HR TD PT24
7.0000 mg | MEDICATED_PATCH | Freq: Once | TRANSDERMAL | Status: AC
  Administered 2019-08-19: 7 mg via TRANSDERMAL
  Filled 2019-08-19: qty 1

## 2019-08-19 MED ORDER — PANTOPRAZOLE SODIUM 40 MG IV SOLR: 40.0000 mg | Freq: Two times a day (BID) | INTRAVENOUS | Status: DC

## 2019-08-19 MED ORDER — SODIUM CHLORIDE 0.9 % IV SOLN
8.0000 mg/h | INTRAVENOUS | Status: DC
  Administered 2019-08-19 – 2019-08-20 (×2): 8 mg/h via INTRAVENOUS
  Filled 2019-08-19 (×2): qty 80

## 2019-08-19 MED ORDER — SODIUM CHLORIDE 0.9% FLUSH
3.0000 mL | Freq: Two times a day (BID) | INTRAVENOUS | Status: DC
  Administered 2019-08-20 – 2019-08-21 (×3): 3 mL via INTRAVENOUS

## 2019-08-19 MED ORDER — LORAZEPAM 2 MG/ML IJ SOLN: 0.0000 mg | Freq: Four times a day (QID) | INTRAMUSCULAR | Status: DC

## 2019-08-19 MED ORDER — SODIUM CHLORIDE 0.9 % IV BOLUS
500.0000 mL | Freq: Once | INTRAVENOUS | Status: AC
  Administered 2019-08-19: 500 mL via INTRAVENOUS

## 2019-08-19 MED ORDER — ALBUTEROL SULFATE (2.5 MG/3ML) 0.083% IN NEBU: 2.5000 mg | INHALATION_SOLUTION | RESPIRATORY_TRACT | Status: DC | PRN

## 2019-08-19 MED ORDER — FUROSEMIDE 10 MG/ML IJ SOLN: 80.0000 mg | Freq: Two times a day (BID) | INTRAMUSCULAR | Status: DC

## 2019-08-19 MED ORDER — THIAMINE HCL 100 MG PO TABS
100.0000 mg | ORAL_TABLET | Freq: Every day | ORAL | Status: DC
  Administered 2019-08-19 – 2019-08-23 (×5): 100 mg via ORAL
  Filled 2019-08-19 (×5): qty 1

## 2019-08-19 MED ORDER — FOLIC ACID 1 MG PO TABS
1.0000 mg | ORAL_TABLET | Freq: Every day | ORAL | Status: DC
  Administered 2019-08-19 – 2019-08-23 (×5): 1 mg via ORAL
  Filled 2019-08-19 (×5): qty 1

## 2019-08-19 MED ORDER — FUROSEMIDE 10 MG/ML IJ SOLN
80.0000 mg | Freq: Once | INTRAMUSCULAR | Status: AC
  Administered 2019-08-19: 80 mg via INTRAVENOUS
  Filled 2019-08-19: qty 8

## 2019-08-19 MED ORDER — SODIUM CHLORIDE 0.9 % IV SOLN
80.0000 mg | Freq: Once | INTRAVENOUS | Status: AC
  Administered 2019-08-19: 80 mg via INTRAVENOUS
  Filled 2019-08-19: qty 80

## 2019-08-19 MED ORDER — SODIUM CHLORIDE 0.9 % IV SOLN: 250.0000 mL | INTRAVENOUS | Status: DC | PRN

## 2019-08-19 MED ORDER — LORAZEPAM 2 MG/ML IJ SOLN: 0.0000 mg | Freq: Two times a day (BID) | INTRAMUSCULAR | Status: DC

## 2019-08-19 MED ORDER — ADULT MULTIVITAMIN W/MINERALS CH
1.0000 | ORAL_TABLET | Freq: Every day | ORAL | Status: DC
  Administered 2019-08-19 – 2019-08-23 (×5): 1 via ORAL
  Filled 2019-08-19 (×5): qty 1

## 2019-08-19 MED ORDER — ACETAMINOPHEN 325 MG PO TABS
650.0000 mg | ORAL_TABLET | Freq: Four times a day (QID) | ORAL | Status: DC | PRN
  Administered 2019-08-19: 650 mg via ORAL
  Filled 2019-08-19: qty 2

## 2019-08-19 MED ORDER — OXYCODONE HCL 5 MG PO TABS
5.0000 mg | ORAL_TABLET | ORAL | Status: DC | PRN
  Administered 2019-08-19 – 2019-08-20 (×3): 5 mg via ORAL
  Filled 2019-08-19 (×3): qty 1

## 2019-08-19 MED ORDER — SODIUM CHLORIDE 0.9% FLUSH: 3.0000 mL | Freq: Two times a day (BID) | INTRAVENOUS | Status: DC

## 2019-08-19 MED ORDER — LORAZEPAM 2 MG/ML IJ SOLN: 1.0000 mg | INTRAMUSCULAR | Status: DC | PRN

## 2019-08-19 MED ORDER — ADULT MULTIVITAMIN W/MINERALS CH: 1.0000 | ORAL_TABLET | Freq: Every day | ORAL | Status: DC

## 2019-08-19 MED ORDER — ALBUTEROL SULFATE HFA 108 (90 BASE) MCG/ACT IN AERS
2.0000 | INHALATION_SPRAY | RESPIRATORY_TRACT | Status: DC | PRN
  Filled 2019-08-19: qty 6.7

## 2019-08-19 MED ORDER — SODIUM CHLORIDE 0.9 % IV SOLN
1.0000 g | INTRAVENOUS | Status: AC
  Administered 2019-08-19 – 2019-08-22 (×3): 1 g via INTRAVENOUS
  Filled 2019-08-19 (×3): qty 10

## 2019-08-19 MED ORDER — GABAPENTIN 100 MG PO CAPS
100.0000 mg | ORAL_CAPSULE | Freq: Three times a day (TID) | ORAL | Status: DC
  Administered 2019-08-19 – 2019-08-23 (×11): 100 mg via ORAL
  Filled 2019-08-19 (×11): qty 1

## 2019-08-19 MED ORDER — THIAMINE HCL 100 MG/ML IJ SOLN: 100.0000 mg | Freq: Every day | INTRAMUSCULAR | Status: DC

## 2019-08-19 MED ORDER — LORAZEPAM 1 MG PO TABS: 1.0000 mg | ORAL_TABLET | ORAL | Status: DC | PRN

## 2019-08-19 MED ORDER — ATORVASTATIN CALCIUM 10 MG PO TABS
5.0000 mg | ORAL_TABLET | Freq: Every day | ORAL | Status: DC
  Administered 2019-08-19 – 2019-08-23 (×5): 5 mg via ORAL
  Filled 2019-08-19 (×5): qty 1

## 2019-08-19 MED ORDER — ACETAMINOPHEN 650 MG RE SUPP: 650.0000 mg | Freq: Four times a day (QID) | RECTAL | Status: DC | PRN

## 2019-08-19 MED FILL — ?CEPHALEXIN 500MG CAPSULE: 500 | 7 days supply | Qty: 14 | Fill #0

## 2019-08-19 NOTE — ED Triage Notes (Signed)
Pt reports bilateral leg swelling and lower back pain, states she has a UTI. Pt is seen by [facility] but is not on dialysis. Pt also reports black stools for the past week.
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
## 2019-08-19 NOTE — ED Notes (Signed)
Placed external cath on patient
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
## 2019-08-19 NOTE — Consult Note (Signed)
[name] Admit Date: 08/19/2019 08/19/2019 Rexene Agent Requesting Physician:  Sloan Leiter  Reason for Consult:  CKD3, Hyponatremia, Edema HPI:  20F presented to the ER earlier today with bilateral leg weakness, diffuse pain, bilateral lower extremity edema, and concern for UTI.  PMH Incudes:  Polysubstance abuse including use, cocaine, marijuana  History of HCV, not treated  Hypertension, on amlodipine  History of UGI be related to NSAIDs 12/2018  History of stone related pyelonephritis requiring ureteral stent 12/2018  Patient was seen at [facility] on 5/26, with Dr. Hollie Salk.  At that visit her creatinine was 2.19, sodium 128, albumin 4.5, potassium 4.5, bicarbonate 14.  UPC was 3.45.  Hemoglobin was 9.4.  She had a UA consistent with UTI and culture eventually revealed pansensitive E. Coli.  She presented to the ER with the above symptoms.  Here her creatinine is 2.3-2.6, serum sodium 117, bicarbonate 13 with anion gap of 15.  Hemoglobin decreased to 7.8.  Serum albumin 3.4.  FOBT positive.  Chest x-ray without acute findings, specifically no edema or effusions.  She denies use of NSAIDs.  She states that she had more than 6 beers yesterday.  She had another beer this morning.  She denies use of cocaine within the past 3 weeks.  No petechia or purpura.  Denies tea colored or grossly bloody urine.  No foamy urine.  Pt states that appetite is poor.  No nausea/vomiting.  No seizures.  No confusion.   Creat (mg/dL)  Date Value  04/29/2016 1.64 (H)  01/16/2016 1.37 (H)  09/29/2015 1.13 (H)  12/22/2014 1.02  02/08/2014 1.27 (H)  06/08/2013 0.76   Creatinine, Ser (mg/dL)  Date Value  08/19/2019 2.60 (H)  08/19/2019 2.33 (H)  07/16/2019 2.68 (H)  01/15/2019 2.41 (H)  01/14/2019 2.48 (H)  01/13/2019 2.74 (H)  01/12/2019 2.82 (H)  01/12/2019 3.28 (H)  01/01/2019 2.51 (H)  12/31/2018 2.67 (H)  ]  ROS NSAIDS: Denies use IV Contrast no exposure Balance of 12 systems is negative  w/ exceptions as above  PMH  Past Medical History:  Diagnosis Date  . Alcohol abuse   . Allergy   . Anxiety   . Arthritis   . Asthma   . Cancer ([place])   . Cannabis abuse   . Cocaine abuse ([place])   . Depression   . Drug addiction ([place])   . GERD (gastroesophageal reflux disease)   . Homelessness   . Hypertension    pt stated "every once in a while BP will be high but has not been prescribed medication for HTN.   . Seasonal allergies   . 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 BIOPSY  01/01/2019   Procedure: BIOPSY;  Surgeon: Jerene Bears, MD;  Location: [facility] ENDOSCOPY;  Service: Endoscopy;;  . [place]  . CYSTOSCOPY W/ URETERAL STENT PLACEMENT Left 08/01/2018   Procedure: CYSTOSCOPY WITH RETROGRADE PYELOGRAM/URETERAL STENT PLACEMENT;  Surgeon: Lucas Mallow, MD;  Location: [facility] ORS;  Service: Urology;  Laterality: Left;  . CYSTOSCOPY WITH RETROGRADE PYELOGRAM, URETEROSCOPY AND STENT PLACEMENT Left 01/15/2019   Procedure: CYSTOSCOPY WITH RETROGRADE PYELOGRAM, URETEROSCOPY AND STENT PLACEMENT;  Surgeon: Lucas Mallow, MD;  Location: [facility];  Service: Urology;  Laterality: Left;  . ESOPHAGOGASTRODUODENOSCOPY (EGD) WITH PROPOFOL N/A 01/01/2019   Procedure: ESOPHAGOGASTRODUODENOSCOPY (EGD) WITH PROPOFOL;  Surgeon: Jerene Bears, MD;  Location:  [facility] ENDOSCOPY;  Service: Endoscopy;  Laterality: N/A;  . FRACTURE SURGERY Left 2011   FH  Family History  Problem Relation Age of Onset  . Diabetes Father   . Colon cancer Neg Hx    SH  reports that she has been smoking cigarettes. She has been smoking about 1.00 pack per day for the past 0.00 years. She has never used smokeless tobacco. She reports current alcohol use of about 7.0 standard drinks of alcohol per week. She reports current drug use. Drugs: Cocaine and Marijuana. Allergies  Allergies  Allergen  Reactions  . Lisinopril Other (See Comments)    hyperkalemia   Home medications Prior to Admission medications   Medication Sig Start Date End Date Taking? Authorizing Provider  acetaminophen (TYLENOL) 500 MG tablet Take 1,000 mg by mouth every 4 (four) hours as needed for mild pain.   Yes [provider]  albuterol (VENTOLIN HFA) 108 (90 Base) MCG/ACT inhaler INHALE 2 PUFFS INTO THE LUNGS EVERY 6 HOURS AS NEEDED FOR WHEEZING OR SHORTNESS OF BREATH. 07/16/19  Yes Ladell Pier, MD  amLODipine (NORVASC) 10 MG tablet Take 1 tablet (10 mg total) by mouth daily. 07/16/19  Yes Ladell Pier, MD  atorvastatin (LIPITOR) 10 MG tablet Take 0.5 tablets (5 mg total) by mouth daily. 07/16/19  Yes Ladell Pier, MD  famotidine (PEPCID) 20 MG tablet Take 1 tablet (20 mg total) by mouth 2 (two) times daily. 07/16/19  Yes Ladell Pier, MD  ferrous sulfate 325 (65 FE) MG tablet Take 1 tablet (325 mg total) by mouth daily with breakfast. 07/16/19  Yes Ladell Pier, MD  gabapentin (NEURONTIN) 100 MG capsule Take 1 capsule (100 mg total) by mouth 3 (three) times daily. 07/16/19  Yes Ladell Pier, MD  Multiple Vitamin (MULTIVITAMIN WITH MINERALS) TABS tablet Take 1 tablet by mouth daily. 07/16/19  Yes Ladell Pier, MD    Current Medications Scheduled Meds: . furosemide  80 mg Intravenous Once  . nicotine  7 mg Transdermal Once  . [START ON 08/23/2019] pantoprazole  40 mg Intravenous Q12H   Continuous Infusions: . pantoprozole (PROTONIX) infusion    . pantoprazole (PROTONIX) IVPB     PRN Meds:.  CBC Recent Labs  Lab 08/19/19 1216 08/19/19 1432  WBC 3.2*  --   HGB 7.8* 7.8*  HCT 22.1* 23.0*  MCV 96.5  --   PLT 143*  --    Basic Metabolic Panel Recent Labs  Lab 08/19/19 1216 08/19/19 1432  NA 118* 117*  K 4.3 4.2  CL 90* 91*  CO2 13*  --   GLUCOSE 116* 103*  BUN 19 19  CREATININE 2.33* 2.60*  CALCIUM 8.9  --     Physical Exam  Blood pressure (!)  120/54, pulse 84, temperature 98.8 F (37.1 C), temperature source Oral, resp. rate (!) 22, height 5\' 7"  (1.702 m), weight 59.9 kg, SpO2 96 %. GEN: NAD, chronically ill-appearing PSYCH: Tangential ENT: NCAT, edentulous EYES: EOMI CV: Regular, normal S1 and S2 PULM: Clear bilaterally ABD: NABS, soft, nontender SKIN: No rashes lesions petechia or purpura EXT: 2+ lower extremity edema extinguishing in the distal shins NEURO: Nonfocal, CN II through XII intact  Assessment 78F with pre-existing CKD 4, at or near baseline, presenting with worsening hyponatremia.  Recent work-up with borderline nephrotic proteinuria, chronic HCV infection.  It appears that her hyponatremia is hypervolemic and further likely worsened by free water in excess of excretion (from low GFR and possible  low solute intake).  Also, hemoglobin is gone from 9.4 last week to 7.8 with FOBT positive history of GI bleed.  She has a chronic metabolic acidosis with mildly increased anion gap.  1. Asymptomatic moderate hyponatremia, likely related to hypervolemia and poor solute intake limiting capacity to excrete free water with low GFR\ 2. 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 Recent E. coli UTI, has not received antibiotics, was pansensitive last week 4. Metabolic acidosis 5. Chronic HCV infection 6. Polysubstance abuse including heavy alcohol use, cocaine use  Plan 1. Fluid restrict to 1.2L total fluids, Low Na diet; lasix 80 IV BID 2. BMP q6h overnight 3. Send off C3, C4, HCV rpt, RF, UP/C 4. GI to eval for GIB 5. ABX per TRH 6. Renal US 7. Elenor Quinones  [phone] 08/19/2019, 4:48 PM
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
## 2019-08-20 ENCOUNTER — Inpatient Hospital Stay (HOSPITAL_COMMUNITY): Payer: Medicare Other

## 2019-08-20 DIAGNOSIS — K922 Gastrointestinal hemorrhage, unspecified: Secondary | ICD-10-CM

## 2019-08-20 DIAGNOSIS — R0600 Dyspnea, unspecified: Secondary | ICD-10-CM

## 2019-08-20 DIAGNOSIS — N184 Chronic kidney disease, stage 4 (severe): Secondary | ICD-10-CM

## 2019-08-20 LAB — BASIC METABOLIC PANEL
Anion gap: 11 (ref 5–15)
Anion gap: 12 (ref 5–15)
Anion gap: 10 (ref 5–15)
BUN: 20 mg/dL (ref 6–20)
BUN: 18 mg/dL (ref 6–20)
BUN: 18 mg/dL (ref 6–20)
CO2: 17 mmol/L — ABNORMAL LOW (ref 22–32)
CO2: 19 mmol/L — ABNORMAL LOW (ref 22–32)
CO2: 18 mmol/L — ABNORMAL LOW (ref 22–32)
Calcium: 8.4 mg/dL — ABNORMAL LOW (ref 8.9–10.3)
Calcium: 8.8 mg/dL — ABNORMAL LOW (ref 8.9–10.3)
Calcium: 8.3 mg/dL — ABNORMAL LOW (ref 8.9–10.3)
Chloride: 97 mmol/L — ABNORMAL LOW (ref 98–111)
Chloride: 97 mmol/L — ABNORMAL LOW (ref 98–111)
Chloride: 98 mmol/L (ref 98–111)
Creatinine, Ser: 2.36 mg/dL — ABNORMAL HIGH (ref 0.44–1.00)
Creatinine, Ser: 2.46 mg/dL — ABNORMAL HIGH (ref 0.44–1.00)
Creatinine, Ser: 2.56 mg/dL — ABNORMAL HIGH (ref 0.44–1.00)
GFR calc Af Amer: 25 mL/min — ABNORMAL LOW (ref >60)
GFR calc Af Amer: 24 mL/min — ABNORMAL LOW (ref >60)
GFR calc Af Amer: 23 mL/min — ABNORMAL LOW (ref >60)
GFR calc non Af Amer: 22 mL/min — ABNORMAL LOW (ref >60)
GFR calc non Af Amer: 21 mL/min — ABNORMAL LOW (ref >60)
GFR calc non Af Amer: 20 mL/min — ABNORMAL LOW (ref >60)
Glucose, Bld: 109 mg/dL — ABNORMAL HIGH (ref 70–99)
Glucose, Bld: 105 mg/dL — ABNORMAL HIGH (ref 70–99)
Glucose, Bld: 114 mg/dL — ABNORMAL HIGH (ref 70–99)
Potassium: 3.7 mmol/L (ref 3.5–5.1)
Potassium: 3.4 mmol/L — ABNORMAL LOW (ref 3.5–5.1)
Potassium: 4 mmol/L (ref 3.5–5.1)
Sodium: 125 mmol/L — ABNORMAL LOW (ref 135–145)
Sodium: 128 mmol/L — ABNORMAL LOW (ref 135–145)
Sodium: 126 mmol/L — ABNORMAL LOW (ref 135–145)

## 2019-08-20 LAB — C3 COMPLEMENT: C3 Complement: 115 mg/dL (ref 82–167)

## 2019-08-20 LAB — RHEUMATOID FACTOR: Rheumatoid fact SerPl-aCnc: 11.1 IU/mL (ref 0.0–13.9)

## 2019-08-20 LAB — HEMOGLOBIN A1C
Hgb A1c MFr Bld: 5.6 % (ref 4.8–5.6)
Mean Plasma Glucose: 114.02 mg/dL

## 2019-08-20 LAB — ECHOCARDIOGRAM COMPLETE
Height: 67 in
Weight: 2190.49 oz

## 2019-08-20 LAB — CBC
HCT: 20.9 % — ABNORMAL LOW (ref 36.0–46.0)
Hemoglobin: 7.2 g/dL — ABNORMAL LOW (ref 12.0–15.0)
MCH: 33.3 pg (ref 26.0–34.0)
MCHC: 34.4 g/dL (ref 30.0–36.0)
MCV: 96.8 fL (ref 80.0–100.0)
Platelets: 131 10*3/uL — ABNORMAL LOW (ref 150–400)
RBC: 2.16 MIL/uL — ABNORMAL LOW (ref 3.87–5.11)
RDW: 13.1 % (ref 11.5–15.5)
WBC: 2.5 10*3/uL — ABNORMAL LOW (ref 4.0–10.5)
nRBC: 0 % (ref 0.0–0.2)

## 2019-08-20 LAB — PROTEIN / CREATININE RATIO, URINE
Creatinine, Urine: 28.83 mg/dL
Protein Creatinine Ratio: 2.43 mg/mg{Cre} — ABNORMAL HIGH (ref 0.00–0.15)
Total Protein, Urine: 70 mg/dL

## 2019-08-20 LAB — CORTISOL: Cortisol, Plasma: 7.4 ug/dL

## 2019-08-20 LAB — ANA W/REFLEX IF POSITIVE: Anti Nuclear Antibody (ANA): NEGATIVE

## 2019-08-20 LAB — C4 COMPLEMENT: Complement C4, Body Fluid: 27 mg/dL (ref 12–38)

## 2019-08-20 MED ORDER — POTASSIUM CHLORIDE CRYS ER 20 MEQ PO TBCR
40.0000 meq | EXTENDED_RELEASE_TABLET | Freq: Once | ORAL | Status: AC
  Administered 2019-08-20: 40 meq via ORAL
  Filled 2019-08-20: qty 2

## 2019-08-20 MED ORDER — BISACODYL 5 MG PO TBEC
20.0000 mg | DELAYED_RELEASE_TABLET | Freq: Once | ORAL | Status: AC
  Administered 2019-08-20: 20 mg via ORAL
  Filled 2019-08-20: qty 4

## 2019-08-20 MED ORDER — MUPIROCIN CALCIUM 2 % EX CREA
TOPICAL_CREAM | Freq: Two times a day (BID) | CUTANEOUS | Status: DC
  Administered 2019-08-21: 1 via TOPICAL
  Filled 2019-08-20: qty 15

## 2019-08-20 MED ORDER — PANTOPRAZOLE SODIUM 40 MG IV SOLR
40.0000 mg | Freq: Two times a day (BID) | INTRAVENOUS | Status: DC
  Administered 2019-08-20 – 2019-08-22 (×6): 40 mg via INTRAVENOUS
  Filled 2019-08-20 (×6): qty 40

## 2019-08-20 MED ORDER — COSYNTROPIN 0.25 MG IJ SOLR
0.2500 mg | Freq: Once | INTRAMUSCULAR | Status: AC
  Administered 2019-08-21: 0.25 mg via INTRAVENOUS
  Filled 2019-08-20: qty 0.25

## 2019-08-20 NOTE — Progress Notes (Addendum)
PROGRESS NOTE        PATIENT DETAILS Name: [name] Age: 59 y.o. Sex: female Date of Birth: 1960-06-26 Admit Date: 08/19/2019 Admitting Physician Evalee Mutton Kristeen Mans, MD [id number], Dalbert Batman, MD  Brief Narrative: Patient is a 59 y.o. female with history of CKD stage IV, chronic hepatitis C, HTN, history of duodenal ulcer/GERD, COPD, polysubstance abuse (alcohol/cocaine/marijuana/tobacco) who presented with generalized weakness, worsening lower extremity swelling, and dark stools-found to have hyponatremia, worsening anemia with concern for upper GI bleeding-and subsequently admitted to the hospitalist service.  See below for further details.  Significant events: 6/3>> admit to [facility] for hyponatremia/upper GI bleeding with acute blood loss anemia.  Significant studies: 6/3: Chest x-ray>> no PNA 6/3: Renal ultrasound>>chronic renal medical disease, no mass or hydronephrosis. 6/4: Echo>> EF 67-89%, grade 2 diastolic dysfunction  Antimicrobial therapy: Rocephin: 6/3>>  Microbiology data: None  Procedures : None  Consults: Nephrology, GI  DVT Prophylaxis :  SCD's  Subjective: No melanotic stools since admission.  Awake alert-no tremors.  Assessment/Plan: Hyponatremia: Secondary to hypervolemia likely related to underlying nephrotic syndrome.  Improving with IV diuretics-await further recommendations from nephrology.  In the meantime we will go and stop Lasix.  She is asymptomatic.  Probable upper GI bleeding with acute blood loss anemia: Continue PPI-but per GI-okay to change to twice daily dosing.  No further melanotic stool since admission-but around 2 g drop of hemoglobin from lab work done last week at nephrology office (Hb 9.4).  For EGD later today.  UTI/cystitis: Per nephrology-had pansensitive E. coli on urine culture last week at nephrology office-on Rocephin x3 days.  CKD stage IV: Per nephrology-almost had nephrotic range proteinuria  and a lab work done last week at nephrology office.  Has history of HCV-and hence concern for glomerular pathology-however complement levels within normal limits.  HIV nonreactive.  A1c 5.6.  ANA pending.  Anasarca: Related to nephrotic syndrome-also has history of liver disease/hepatitis C-no overt stigmata of liver cirrhosis apparent.  Echo with preserved EF-but does have grade 2 diastolic dysfunction.  Significant improvement in edema after 1 dose of 80 mg of IV Lasix given in the ED yesterday.  Lasix held-await further input from nephrology.  HTN: BP stable currently-follow-all antihypertensives remain on hold.  Chronic low back pain/bilateral knee pain: Supportive care for now.  Polysubstance abuse (alcohol/cocaine/marijuana/tobacco use): no signs of alcohol withdrawal at this time-on Ativan per protocol.  Equivocal/borderline low a.m. cortisol level: We will perform a ACTH stimulation test tomorrow morning.  Chronic hepatitis C: Per patient-she has never been treated-I am not sure if she is keen on pursuing treatment-but will need referral to the ID clinic on discharge.  Diet: Diet Order            Diet Heart Room service appropriate? Yes; Fluid consistency: Thin  Diet effective now               Code Status: Full code   Family Communication: None at bedside  Disposition Plan: Status is: Inpatient  Remains inpatient appropriate because:Inpatient level of care appropriate due to severity of illness  Dispo: The patient is from: Home              Anticipated d/c is to: Home              Anticipated d/c date is: 2 days  Patient currently is not medically stable to d/c.   Barriers to Discharge: GI bleeding with acute blood loss anemia-for EGD later today, improving hyponatremia.  Antimicrobial agents: Anti-infectives (From admission, onward)   Start     Dose/Rate Route Frequency Ordered Stop   08/19/19 1915  cefTRIAXone (ROCEPHIN) 1 g in sodium chloride 0.9  % 100 mL IVPB     1 g 200 mL/hr over 30 Minutes Intravenous Every 24 hours 08/19/19 1900 08/22/19 1914       Time spent: 25- minutes-Greater than 50% of this time was spent in counseling, explanation of diagnosis, planning of further management, and coordination of care.  MEDICATIONS: Scheduled Meds: . atorvastatin  5 mg Oral Daily  . bisacodyl  20 mg Oral Once  . feeding supplement  1 Container Oral TID BM  . folic acid  1 mg Oral Daily  . gabapentin  100 mg Oral TID  . LORazepam  0-4 mg Intravenous Q6H   Followed by  . [START ON 08/21/2019] LORazepam  0-4 mg Intravenous Q12H  . multivitamin with minerals  1 tablet Oral Daily  . mupirocin cream   Topical BID  . nicotine  7 mg Transdermal Once  . pantoprazole (PROTONIX) IV  40 mg Intravenous Q12H  . sodium chloride flush  3 mL Intravenous Q12H  . thiamine  100 mg Oral Daily   Or  . thiamine  100 mg Intravenous Daily   Continuous Infusions: . sodium chloride Stopped (08/20/19 0609)  . cefTRIAXone (ROCEPHIN)  IV Stopped (08/19/19 2023)   PRN Meds:.sodium chloride, acetaminophen **OR** acetaminophen, albuterol, LORazepam **OR** LORazepam, ondansetron **OR** ondansetron (ZOFRAN) IV, oxyCODONE, sodium chloride flush   PHYSICAL EXAM: Vital signs: Vitals:   08/20/19 0400 08/20/19 0410 08/20/19 0600 08/20/19 0743  BP: 111/75   115/69  Pulse: 71 68 75 85  Resp: (!) 7 10 14 11   Temp:    97.9 F (36.6 C)  TempSrc:    Oral  SpO2: 95% 94% 94% 97%  Weight:      Height:       Filed Weights   08/19/19 1208 08/19/19 2044 08/20/19 0323  Weight: 59.9 kg 62.1 kg 62.1 kg   Body mass index is 21.44 kg/m.   Gen Exam:Alert awake-not in any distress HEENT:atraumatic, normocephalic Chest: B/L clear to auscultation anteriorly CVS:S1S2 regular Abdomen:soft non tender, non distended Extremities:+ edema Neurology: Non focal Skin: no rash  I have personally reviewed following labs and imaging studies  LABORATORY DATA: CBC: Recent  Labs  Lab 08/19/19 1216 08/19/19 1432 08/19/19 2058 08/20/19 0410  WBC 3.2*  --  2.6* 2.5*  HGB 7.8* 7.8* 7.5* 7.2*  HCT 22.1* 23.0* 21.7* 20.9*  MCV 96.5  --  96.4 96.8  PLT 143*  --  152 131*    Basic Metabolic Panel: Recent Labs  Lab 08/19/19 1216 08/19/19 1216 08/19/19 1432 08/19/19 2058 08/19/19 2316 08/20/19 0410 08/20/19 0727  NA 118*   < > 117* 126* 125* 125* 128*  K 4.3   < > 4.2 3.5 3.8 3.7 3.4*  CL 90*   < > 91* 97* 98 97* 97*  CO2 13*  --   --  16* 14* 17* 19*  GLUCOSE 116*   < > 103* 132* 128* 109* 105*  BUN 19   < > 19 17 18 20 18   CREATININE 2.33*   < > 2.60* 2.47* 2.43* 2.36* 2.46*  CALCIUM 8.9  --   --  8.7* 8.6* 8.4* 8.8*  MG  --   --   --  1.7  --   --   --   PHOS  --   --   --  3.5  --   --   --    < > = values in this interval not displayed.    GFR: Estimated Creatinine Clearance: 24.2 mL/min (A) (by C-G formula based on SCr of 2.46 mg/dL (H)).  Liver Function Tests: Recent Labs  Lab 08/19/19 1216 08/19/19 2058  AST 67* 50*  ALT 26 25  ALKPHOS 79 77  BILITOT 0.3 0.4  PROT 7.2 6.7  ALBUMIN 3.4* 3.1*   No results for input(s): LIPASE, AMYLASE in the last 168 hours. No results for input(s): AMMONIA in the last 168 hours.  Coagulation Profile: No results for input(s): INR, PROTIME in the last 168 hours.  Cardiac Enzymes: No results for input(s): CKTOTAL, CKMB, CKMBINDEX, TROPONINI in the last 168 hours.  BNP (last 3 results) No results for input(s): PROBNP in the last 8760 hours.  Lipid Profile: No results for input(s): CHOL, HDL, LDLCALC, TRIG, CHOLHDL, LDLDIRECT in the last 72 hours.  Thyroid Function Tests: Recent Labs    08/19/19 1543  TSH 1.951    Anemia Panel: Recent Labs    08/19/19 2058  VITAMINB12 388  FOLATE 9.9  FERRITIN 35  TIBC 396  IRON 32  RETICCTPCT 2.4    Urine analysis:    Component Value Date/Time   COLORURINE YELLOW 08/19/2019 1629   APPEARANCEUR HAZY (A) 08/19/2019 1629   LABSPEC 1.002 (L)  08/19/2019 1629   PHURINE 6.0 08/19/2019 1629   GLUCOSEU NEGATIVE 08/19/2019 1629   HGBUR SMALL (A) 08/19/2019 1629   BILIRUBINUR NEGATIVE 08/19/2019 1629   KETONESUR NEGATIVE 08/19/2019 1629   PROTEINUR 100 (A) 08/19/2019 1629   UROBILINOGEN 0.2 03/02/2010 2027   NITRITE POSITIVE (A) 08/19/2019 1629   LEUKOCYTESUR SMALL (A) 08/19/2019 1629    Sepsis Labs: Lactic Acid, Venous    Component Value Date/Time   LATICACIDVEN 1.1 01/12/2019 0214   LATICACIDVEN 1.3 01/12/2019 0214    MICROBIOLOGY: Recent Results (from the past 240 hour(s))  SARS Coronavirus 2 by RT PCR (hospital order, performed in [place] hospital lab) Nasopharyngeal Nasopharyngeal Swab     Status: None   Collection Time: 08/19/19  3:12 PM   Specimen: Nasopharyngeal Swab  Result Value Ref Range Status   SARS Coronavirus 2 NEGATIVE NEGATIVE Final    Comment: (NOTE) SARS-CoV-2 target nucleic acids are NOT DETECTED. The SARS-CoV-2 RNA is generally detectable in upper and lower respiratory specimens during the acute phase of infection. The lowest concentration of SARS-CoV-2 viral copies this assay can detect is 250 copies / mL. A negative result does not preclude SARS-CoV-2 infection and should not be used as the sole basis for treatment or other patient management decisions.  A negative result may occur with improper specimen collection / handling, submission of specimen other than nasopharyngeal swab, presence of viral mutation(s) within the areas targeted by this assay, and inadequate number of viral copies (<250 copies / mL). A negative result must be combined with clinical observations, patient history, and epidemiological information. Fact Sheet for Patients:   StrictlyIdeas.no Fact Sheet for Healthcare Providers: BankingDealers.co.za This test is not yet approved or cleared  by the [place] FDA and has been authorized for detection and/or diagnosis of  SARS-CoV-2 by FDA under an Emergency Use Authorization (EUA).  This EUA will remain in effect (meaning this test can be used) for the duration of the COVID-19 declaration under Section 564(b)(1) of  the Act, 21 U.S.C. section 360bbb-3(b)(1), unless the authorization is terminated or revoked sooner. Performed at [facility], [place] [address] [place], [place] [address]     RADIOLOGY STUDIES/RESULTS: US RENAL  Result Date: 08/19/2019 CLINICAL DATA:  Chronic kidney disease stage 4 EXAM: RENAL / URINARY TRACT ULTRASOUND COMPLETE COMPARISON:  None. FINDINGS: Right Kidney: Renal measurements: 7.0 x 3.8 x 3.9 cm = volume: 54 mL. Increased echotexture. No mass or hydronephrosis. Left Kidney: Renal measurements: 8.3 x 3.9 x 4.6 cm = volume: 79 mL. Increased echotexture. No mass or hydronephrosis. Bladder: Appears normal for degree of bladder distention. Other: None. IMPRESSION: Small, echogenic kidneys bilaterally compatible with chronic medical renal disease. No acute findings. Electronically Signed   By: Rolm Baptise M.D.   On: 08/19/2019 18:00   DG Chest Portable 1 View  Result Date: 08/19/2019 CLINICAL DATA:  Shortness of breath. EXAM: PORTABLE CHEST 1 VIEW COMPARISON:  01/12/2019 FINDINGS: The heart size and mediastinal contours are within normal limits. Both lungs are clear. The visualized skeletal structures are unremarkable. IMPRESSION: Normal exam. Electronically Signed   By: Lorriane Shire M.D.   On: 08/19/2019 14:38   ECHOCARDIOGRAM COMPLETE  Result Date: 08/20/2019    ECHOCARDIOGRAM REPORT   Patient Name:   LOURETTA TANTILLO Klausing Date of Exam: 08/20/2019 Medical Rec #:  [id number]      Height:       67.0 in Accession #:    [id number]     Weight:       136.9 lb Date of Birth:  [birth year]      BSA:          1.721 m Patient Age:    87 years       BP:           111/75 mmHg Patient Gender: F              HR:           85 bpm. Exam Location:  Inpatient Procedure: 2D Echo, Cardiac Doppler and Color Doppler  Indications:    Dyspnea  History:        Patient has prior history of Echocardiogram examinations, most                 recent 06/08/2016. Risk Factors:Hypertension and Current Smoker.                 Polysubstance abuse. CKD.  Sonographer:    Clayton Lefort RDCS (AE) Referring Phys: [place]  1. Left ventricular ejection fraction, by estimation, is 60 to 65%. The left ventricle has normal function. The left ventricle has no regional wall motion abnormalities. Left ventricular diastolic parameters are consistent with Grade II diastolic dysfunction (pseudonormalization). Elevated left ventricular end-diastolic pressure.  2. Right ventricular systolic function is normal. The right ventricular size is normal. There is normal pulmonary artery systolic pressure.  3. Cannot r/o PFO.  4. The mitral valve is normal in structure. Trivial mitral valve regurgitation. No evidence of mitral stenosis.  5. The aortic valve is normal in structure. Aortic valve regurgitation is not visualized. Mild to moderate aortic valve sclerosis/calcification is present, without any evidence of aortic stenosis.  6. The inferior vena cava is normal in size with greater than 50% respiratory variability, suggesting right atrial pressure of 3 mmHg. FINDINGS  Left Ventricle: Left ventricular ejection fraction, by estimation, is 60 to 65%. The left ventricle has normal function. The left ventricle has no regional wall  motion abnormalities. The left ventricular internal cavity size was normal in size. There is  no left ventricular hypertrophy. Left ventricular diastolic parameters are consistent with Grade II diastolic dysfunction (pseudonormalization). Elevated left ventricular end-diastolic pressure. Right Ventricle: The right ventricular size is normal. No increase in right ventricular wall thickness. Right ventricular systolic function is normal. There is normal pulmonary artery systolic pressure. The tricuspid regurgitant  velocity is 2.16 m/s, and  with an assumed right atrial pressure of 3 mmHg, the estimated right ventricular systolic pressure is 05.3 mmHg. Left Atrium: Left atrial size was normal in size. Right Atrium: Right atrial size was normal in size. Pericardium: There is no evidence of pericardial effusion. Mitral Valve: The mitral valve is normal in structure. There is mild thickening of the mitral valve leaflet(s). There is mild calcification of the mitral valve leaflet(s). Normal mobility of the mitral valve leaflets. Trivial mitral valve regurgitation. No evidence of mitral valve stenosis. MV peak gradient, 10.6 mmHg. The mean mitral valve gradient is 4.0 mmHg. Tricuspid Valve: The tricuspid valve is normal in structure. Tricuspid valve regurgitation is not demonstrated. No evidence of tricuspid stenosis. Aortic Valve: The aortic valve is normal in structure. Aortic valve regurgitation is not visualized. Mild to moderate aortic valve sclerosis/calcification is present, without any evidence of aortic stenosis. Aortic valve mean gradient measures 7.0 mmHg. Aortic valve peak gradient measures 14.0 mmHg. Aortic valve area, by VTI measures 1.74 cm. Pulmonic Valve: The pulmonic valve was normal in structure. Pulmonic valve regurgitation is not visualized. No evidence of pulmonic stenosis. Aorta: The aortic root is normal in size and structure. Venous: The inferior vena cava is normal in size with greater than 50% respiratory variability, suggesting right atrial pressure of 3 mmHg. IAS/Shunts: The interatrial septum was not well visualized.  LEFT VENTRICLE PLAX 2D LVIDd:         3.50 cm  Diastology LVIDs:         2.10 cm  LV e' lateral:   8.16 cm/s LV PW:         1.20 cm  LV E/e' lateral: 15.1 LV IVS:        1.00 cm  LV e' medial:    11.90 cm/s LVOT diam:     1.80 cm  LV E/e' medial:  10.3 LV SV:         53 LV SV Index:   31 LVOT Area:     2.54 cm  RIGHT VENTRICLE             IVC RV Basal diam:  1.90 cm     IVC diam: 1.80 cm  RV S prime:     15.70 cm/s TAPSE (M-mode): 2.9 cm LEFT ATRIUM           Index       RIGHT ATRIUM          Index LA diam:      2.80 cm 1.63 cm/m  RA Area:     9.44 cm LA Vol (A4C): 57.9 ml 33.64 ml/m RA Volume:   17.40 ml 10.11 ml/m  AORTIC VALVE AV Area (Vmax):    1.80 cm AV Area (Vmean):   1.85 cm AV Area (VTI):     1.74 cm AV Vmax:           187.00 cm/s AV Vmean:          121.000 cm/s AV VTI:            0.305 m AV Peak  Grad:      14.0 mmHg AV Mean Grad:      7.0 mmHg LVOT Vmax:         132.00 cm/s LVOT Vmean:        88.200 cm/s LVOT VTI:          0.208 m LVOT/AV VTI ratio: 0.68  AORTA Ao Root diam: 3.10 cm MITRAL VALVE                TRICUSPID VALVE MV Area (PHT): 3.27 cm     TR Peak grad:   18.7 mmHg MV Peak grad:  10.6 mmHg    TR Vmax:        216.00 cm/s MV Mean grad:  4.0 mmHg MV Vmax:       1.63 m/s     SHUNTS MV Vmean:      98.6 cm/s    Systemic VTI:  0.21 m MV Decel Time: 232 msec     Systemic Diam: 1.80 cm MV E velocity: 123.00 cm/s MV A velocity: 109.00 cm/s MV E/A ratio:  1.13 Jenkins Rouge MD Electronically signed by Jenkins Rouge MD Signature Date/Time: 08/20/2019/9:53:20 AM    Final      LOS: 1 day   Oren Binet, MD  [facility]    To contact the attending provider between 7A-7P or the covering provider during after hours 7P-7A, please log into the web site [email or web address] and access using universal [place] password for that web site. If you do not have the password, please call the hospital operator.  08/20/2019, 11:01 AM

## 2019-08-20 NOTE — Progress Notes (Signed)
Blount, NP notified via text page that BMP has resulted.

## 2019-08-20 NOTE — Consult Note (Signed)
WOC Nurse Consult Note: Patient receiving care in [facility] [phone]. Reason for Consult: right wrist wound, patient states started on Tuesday of this week Wound type: trauma injury Pressure Injury POA: Yes/No/NA Measurement: deferred Wound bed: dried yellow crusting Drainage (amount, consistency, odor) none Periwound: intact Dressing procedure/placement/frequency: 5% bactroban cream, apply to right wrist wound. Cover with gauze, secure with kerlex. Monitor the wound area(s) for worsening of condition such as: Signs/symptoms of infection,  Increase in size,  Development of or worsening of odor, Development of pain, or increased pain at the affected locations.  Notify the medical team if any of these develop.  Thank you for the consult.  Discussed plan of care with the patient.  [place] nurse will not follow at this time.  Please re-consult the [place] team if needed.  Val Riles, RN, MSN, CWOCN, CNS-BC, pager [phone]

## 2019-08-20 NOTE — Progress Notes (Signed)
Patient admitted to unit. Patient A&O x4. Patient slightly anxious and irritated with not being able to eat. Clear liquids provided. Patient oriented to unit and encouraged not to get OOB unassisted. Bed alarm on, call light within reach. Will continue to monitor.

## 2019-08-20 NOTE — Progress Notes (Signed)
Initial Nutrition Assessment  DOCUMENTATION CODES:   Not applicable  INTERVENTION:  Continue Boost Breeze po TID, each supplement provides 250 kcal and 9 grams of protein  Encourage adequate PO intake.   NUTRITION DIAGNOSIS:   Increased nutrient needs related to chronic illness(COPD) as evidenced by estimated needs.  GOAL:   Patient will meet greater than or equal to 90% of their needs  MONITOR:   PO intake, Supplement acceptance, Skin, Weight trends, Labs, I & O's  REASON FOR ASSESSMENT:   Malnutrition Screening Tool    ASSESSMENT:   59 y.o. female with history of CKD stage IV, chronic hepatitis C, HTN, history of duodenal ulcer/GERD, COPD, polysubstance abuse presents with generalized weakness, worsening lower extremity swelling, and dark stools-found to have hyponatremia, worsening anemia with concern for upper GI bleeding.  Pt asleep during time of visit and did not awaken to RD visit. RD unable to obtain pt nutrition history at this time. Diet has been advanced to a heart healthy diet. Pt currently has Boost Breeze ordered and has been consuming them. Plans for EDG tomorrow.   Unable to complete Nutrition-Focused physical exam at this time.   Labs and medications reviewed.   Diet Order:   Diet Order            Diet NPO time specified Except for: Sips with Meds  Diet effective midnight        Diet Heart Room service appropriate? Yes; Fluid consistency: Thin  Diet effective now              EDUCATION NEEDS:   Not appropriate for education at this time  Skin:  Skin Assessment: Reviewed RN Assessment  Last BM:  6/3  Height:   Ht Readings from Last 1 Encounters:  08/19/19 5\' 7"  (1.702 m)    Weight:   Wt Readings from Last 1 Encounters:  08/20/19 62.1 kg    BMI:  Body mass index is 21.44 kg/m.  Estimated Nutritional Needs:   Kcal:  1800-1950  Protein:  85-95 grams  Fluid:  >/= 1.8 L/day   Corrin Parker, MS, RD, LDN RD pager  number/after hours weekend pager number on Amion.

## 2019-08-20 NOTE — Progress Notes (Signed)
  Echocardiogram 2D Echocardiogram has been performed.  [name] 08/20/2019, 9:28 AM

## 2019-08-20 NOTE — Progress Notes (Signed)
   08/20/19 0400  Assess: MEWS Score  BP 111/75  Pulse Rate 71  ECG Heart Rate 72  Resp (!) 7  SpO2 95 %  O2 Device Room Air  Patient Activity (if Appropriate) In bed  Assess: MEWS Score  MEWS Temp 0  MEWS Systolic 0  MEWS Pulse 0  MEWS RR 2  MEWS LOC 0  MEWS Score 2  MEWS Score Color Yellow  Assess: if the MEWS score is Yellow or Red  Were vital signs taken at a resting state? Yes (Patient sleeping.)  Focused Assessment Documented focused assessment  Early Detection of Sepsis Score *See Row Information* Low  MEWS guidelines implemented *See Row Information* No, vital signs rechecked   Patient was sleeping, easily aroused. SpO2 remains above 94% on RA. No distress noted. Will continue to monitor.

## 2019-08-20 NOTE — Progress Notes (Addendum)
[facility] NEPHROLOGY PROGRESS NOTE  Assessment/ Plan: Pt is a 59 y.o. yo female with HTN, polynephritis, polysubstance abuse, hepatitis C infection not treated, GI bleed, CKD 4 with nephrotic syndrome follows with Dr. Hollie Salk, last creatinine level 2.19, Na 128 on 5/26 at [facility] admitted with leg weakness, pain, edema and UTI.  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  Order spot UPC to quantify proteinuria.  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  ANA is weakly positive 1: 40, not significant.  RA, C3, C4 negative.  Pending hep C RNA level.  US renal with small bilateral echogenic kidneys.  Probably no benefit of biopsy at this time.  Creatinine remains stable.  She is nonoliguric and no uremic feature.  #Hyponatremia, hypervolemic probably due to poor oral intake: Both urine sodium and osmolality low.  She denied drinking plenty of water/liquid.  Received a dose of Lasix 80 mg with improvement of sodium to 128.  Sodium corrected around 10 since yesterday.  Holding further diuretics.  She is asymptomatic.  She has chronic hyponatremia.  Encouraged to increase oral intake.  #Hypokalemia: Replete potassium chloride.  #Metabolic acidosis: CO2 level improving.  Monitor.  #E. coli UTI: On ceftriaxone.  #Acute blood loss anemia probably due to GI bleed: GI is following plan for endoscopy evaluation.  Transfuse as needed.  #Chronic hepatitis C infection: Follow-up hepatitis C RNA.  GI is following.  # HTN/volume: Blood pressure acceptable.  Amlodipine on hold.  Subjective: Seen and examined at bedside.  Denies nausea vomiting chest pain shortness of breath.  No headache or dizziness.  Feels weak. Objective Vital signs in last 24 hours: Vitals:   08/20/19 0400 08/20/19 0410 08/20/19 0600 08/20/19 0743  BP: 111/75   115/69  Pulse: 71 68 75 85  Resp: (!) 7 10 14 11   Temp:    97.9 F (36.6 C)  TempSrc:    Oral  SpO2: 95% 94% 94% 97%  Weight:       Height:       Weight change:   Intake/Output Summary (Last 24 hours) at 08/20/2019 1120 Last data filed at 08/20/2019 1000 Gross per 24 hour  Intake 1190.77 ml  Output 1100 ml  Net 90.77 ml       Labs: Basic Metabolic Panel: Recent Labs  Lab 08/19/19 2058 08/19/19 2058 08/19/19 2316 08/20/19 0410 08/20/19 0727  NA 126*   < > 125* 125* 128*  K 3.5   < > 3.8 3.7 3.4*  CL 97*   < > 98 97* 97*  CO2 16*   < > 14* 17* 19*  GLUCOSE 132*   < > 128* 109* 105*  BUN 17   < > 18 20 18   CREATININE 2.47*   < > 2.43* 2.36* 2.46*  CALCIUM 8.7*   < > 8.6* 8.4* 8.8*  PHOS 3.5  --   --   --   --    < > = values in this interval not displayed.   Liver Function Tests: Recent Labs  Lab 08/19/19 1216 08/19/19 2058  AST 67* 50*  ALT 26 25  ALKPHOS 79 77  BILITOT 0.3 0.4  PROT 7.2 6.7  ALBUMIN 3.4* 3.1*   No results for input(s): LIPASE, AMYLASE in the last 168 hours. No results for input(s): AMMONIA in the last 168 hours. CBC: Recent Labs  Lab 08/19/19 1216 08/19/19 1216 08/19/19 1432 08/19/19 2058 08/20/19 0410  WBC 3.2*  --   --  2.6* 2.5*  HGB 7.8*   < > 7.8* 7.5* 7.2*  HCT 22.1*   < > 23.0* 21.7* 20.9*  MCV 96.5  --   --  96.4 96.8  PLT 143*  --   --  152 131*   < > = values in this interval not displayed.   Cardiac Enzymes: No results for input(s): CKTOTAL, CKMB, CKMBINDEX, TROPONINI in the last 168 hours. CBG: No results for input(s): GLUCAP in the last 168 hours.  Iron Studies:  Recent Labs    08/19/19 2058  IRON 32  TIBC 396  FERRITIN 35   Studies/Results: US RENAL  Result Date: 08/19/2019 CLINICAL DATA:  Chronic kidney disease stage 4 EXAM: RENAL / URINARY TRACT ULTRASOUND COMPLETE COMPARISON:  None. FINDINGS: Right Kidney: Renal measurements: 7.0 x 3.8 x 3.9 cm = volume: 54 mL. Increased echotexture. No mass or hydronephrosis. Left Kidney: Renal measurements: 8.3 x 3.9 x 4.6 cm = volume: 79 mL. Increased echotexture. No mass or hydronephrosis. Bladder:  Appears normal for degree of bladder distention. Other: None. IMPRESSION: Small, echogenic kidneys bilaterally compatible with chronic medical renal disease. No acute findings. Electronically Signed   By: Rolm Baptise M.D.   On: 08/19/2019 18:00   DG Chest Portable 1 View  Result Date: 08/19/2019 CLINICAL DATA:  Shortness of breath. EXAM: PORTABLE CHEST 1 VIEW COMPARISON:  01/12/2019 FINDINGS: The heart size and mediastinal contours are within normal limits. Both lungs are clear. The visualized skeletal structures are unremarkable. IMPRESSION: Normal exam. Electronically Signed   By: Lorriane Shire M.D.   On: 08/19/2019 14:38   ECHOCARDIOGRAM COMPLETE  Result Date: 08/20/2019    ECHOCARDIOGRAM REPORT   Patient Name:   [name] Date of Exam: 08/20/2019 Medical Rec #:  [id number]      Height:       67.0 in Accession #:    [id number]     Weight:       136.9 lb Date of Birth:  1961-02-23      BSA:          1.721 m Patient Age:    23 years       BP:           111/75 mmHg Patient Gender: F              HR:           85 bpm. Exam Location:  Inpatient Procedure: 2D Echo, Cardiac Doppler and Color Doppler Indications:    Dyspnea  History:        Patient has prior history of Echocardiogram examinations, most                 recent 06/08/2016. Risk Factors:Hypertension and Current Smoker.                 Polysubstance abuse. CKD.  Sonographer:    Clayton Lefort RDCS (AE) Referring Phys: [place]  1. Left ventricular ejection fraction, by estimation, is 60 to 65%. The left ventricle has normal function. The left ventricle has no regional wall motion abnormalities. Left ventricular diastolic parameters are consistent with Grade II diastolic dysfunction (pseudonormalization). Elevated left ventricular end-diastolic pressure.  2. Right ventricular systolic function is normal. The right ventricular size is normal. There is normal pulmonary artery systolic pressure.  3. Cannot r/o PFO.  4. The mitral  valve is normal in structure. Trivial mitral valve regurgitation. No evidence of mitral stenosis.  5. The aortic valve  is normal in structure. Aortic valve regurgitation is not visualized. Mild to moderate aortic valve sclerosis/calcification is present, without any evidence of aortic stenosis.  6. The inferior vena cava is normal in size with greater than 50% respiratory variability, suggesting right atrial pressure of 3 mmHg. FINDINGS  Left Ventricle: Left ventricular ejection fraction, by estimation, is 60 to 65%. The left ventricle has normal function. The left ventricle has no regional wall motion abnormalities. The left ventricular internal cavity size was normal in size. There is  no left ventricular hypertrophy. Left ventricular diastolic parameters are consistent with Grade II diastolic dysfunction (pseudonormalization). Elevated left ventricular end-diastolic pressure. Right Ventricle: The right ventricular size is normal. No increase in right ventricular wall thickness. Right ventricular systolic function is normal. There is normal pulmonary artery systolic pressure. The tricuspid regurgitant velocity is 2.16 m/s, and  with an assumed right atrial pressure of 3 mmHg, the estimated right ventricular systolic pressure is 62.3 mmHg. Left Atrium: Left atrial size was normal in size. Right Atrium: Right atrial size was normal in size. Pericardium: There is no evidence of pericardial effusion. Mitral Valve: The mitral valve is normal in structure. There is mild thickening of the mitral valve leaflet(s). There is mild calcification of the mitral valve leaflet(s). Normal mobility of the mitral valve leaflets. Trivial mitral valve regurgitation. No evidence of mitral valve stenosis. MV peak gradient, 10.6 mmHg. The mean mitral valve gradient is 4.0 mmHg. Tricuspid Valve: The tricuspid valve is normal in structure. Tricuspid valve regurgitation is not demonstrated. No evidence of tricuspid stenosis. Aortic Valve:  The aortic valve is normal in structure. Aortic valve regurgitation is not visualized. Mild to moderate aortic valve sclerosis/calcification is present, without any evidence of aortic stenosis. Aortic valve mean gradient measures 7.0 mmHg. Aortic valve peak gradient measures 14.0 mmHg. Aortic valve area, by VTI measures 1.74 cm. Pulmonic Valve: The pulmonic valve was normal in structure. Pulmonic valve regurgitation is not visualized. No evidence of pulmonic stenosis. Aorta: The aortic root is normal in size and structure. Venous: The inferior vena cava is normal in size with greater than 50% respiratory variability, suggesting right atrial pressure of 3 mmHg. IAS/Shunts: The interatrial septum was not well visualized.  LEFT VENTRICLE PLAX 2D LVIDd:         3.50 cm  Diastology LVIDs:         2.10 cm  LV e' lateral:   8.16 cm/s LV PW:         1.20 cm  LV E/e' lateral: 15.1 LV IVS:        1.00 cm  LV e' medial:    11.90 cm/s LVOT diam:     1.80 cm  LV E/e' medial:  10.3 LV SV:         53 LV SV Index:   31 LVOT Area:     2.54 cm  RIGHT VENTRICLE             IVC RV Basal diam:  1.90 cm     IVC diam: 1.80 cm RV S prime:     15.70 cm/s TAPSE (M-mode): 2.9 cm LEFT ATRIUM           Index       RIGHT ATRIUM          Index LA diam:      2.80 cm 1.63 cm/m  RA Area:     9.44 cm LA Vol (A4C): 57.9 ml 33.64 ml/m RA Volume:   17.40 ml  10.11 ml/m  AORTIC VALVE AV Area (Vmax):    1.80 cm AV Area (Vmean):   1.85 cm AV Area (VTI):     1.74 cm AV Vmax:           187.00 cm/s AV Vmean:          121.000 cm/s AV VTI:            0.305 m AV Peak Grad:      14.0 mmHg AV Mean Grad:      7.0 mmHg LVOT Vmax:         132.00 cm/s LVOT Vmean:        88.200 cm/s LVOT VTI:          0.208 m LVOT/AV VTI ratio: 0.68  AORTA Ao Root diam: 3.10 cm MITRAL VALVE                TRICUSPID VALVE MV Area (PHT): 3.27 cm     TR Peak grad:   18.7 mmHg MV Peak grad:  10.6 mmHg    TR Vmax:        216.00 cm/s MV Mean grad:  4.0 mmHg MV Vmax:       1.63 m/s      SHUNTS MV Vmean:      98.6 cm/s    Systemic VTI:  0.21 m MV Decel Time: 232 msec     Systemic Diam: 1.80 cm MV E velocity: 123.00 cm/s MV A velocity: 109.00 cm/s MV E/A ratio:  1.13 Jenkins Rouge MD Electronically signed by Jenkins Rouge MD Signature Date/Time: 08/20/2019/9:53:20 AM    Final     Medications: Infusions: . sodium chloride Stopped (08/20/19 0609)  . cefTRIAXone (ROCEPHIN)  IV Stopped (08/19/19 2023)    Scheduled Medications: . atorvastatin  5 mg Oral Daily  . feeding supplement  1 Container Oral TID BM  . folic acid  1 mg Oral Daily  . gabapentin  100 mg Oral TID  . LORazepam  0-4 mg Intravenous Q6H   Followed by  . [START ON 08/21/2019] LORazepam  0-4 mg Intravenous Q12H  . multivitamin with minerals  1 tablet Oral Daily  . mupirocin cream   Topical BID  . nicotine  7 mg Transdermal Once  . pantoprazole (PROTONIX) IV  40 mg Intravenous Q12H  . sodium chloride flush  3 mL Intravenous Q12H  . thiamine  100 mg Oral Daily   Or  . thiamine  100 mg Intravenous Daily    have reviewed scheduled and prn medications.  Physical Exam: General:NAD, comfortable Heart:RRR, s1s2 nl Lungs:clear b/l, no crackle Abdomen:soft, Non-tender, non-distended Extremities:No LE edema Neurology: Alert awake and following command.  No asterixis  Casey Maxfield Tanna Furry 08/20/2019,11:20 AM  LOS: 1 day  Pager: [phone]

## 2019-08-21 ENCOUNTER — Encounter (HOSPITAL_COMMUNITY)
Admission: EM | Disposition: A | Discharge status: Home / Self Care | Payer: Self-pay | Source: Home / Self Care | Attending: Internal Medicine

## 2019-08-21 ENCOUNTER — Inpatient Hospital Stay (HOSPITAL_COMMUNITY): Payer: Medicare Other | Admitting: Certified Registered"

## 2019-08-21 ENCOUNTER — Encounter (HOSPITAL_COMMUNITY): Payer: Self-pay | Admitting: Internal Medicine

## 2019-08-21 DIAGNOSIS — K259 Gastric ulcer, unspecified as acute or chronic, without hemorrhage or perforation: Secondary | ICD-10-CM

## 2019-08-21 DIAGNOSIS — K254 Chronic or unspecified gastric ulcer with hemorrhage: Secondary | ICD-10-CM

## 2019-08-21 DIAGNOSIS — K269 Duodenal ulcer, unspecified as acute or chronic, without hemorrhage or perforation: Secondary | ICD-10-CM

## 2019-08-21 DIAGNOSIS — F1910 Other psychoactive substance abuse, uncomplicated: Secondary | ICD-10-CM

## 2019-08-21 DIAGNOSIS — K2970 Gastritis, unspecified, without bleeding: Secondary | ICD-10-CM

## 2019-08-21 HISTORY — PX: ESOPHAGOGASTRODUODENOSCOPY (EGD) WITH PROPOFOL: SHX5813

## 2019-08-21 LAB — CBC
HCT: 20.5 % — ABNORMAL LOW (ref 36.0–46.0)
Hemoglobin: 7 g/dL — ABNORMAL LOW (ref 12.0–15.0)
MCH: 33.5 pg (ref 26.0–34.0)
MCHC: 34.1 g/dL (ref 30.0–36.0)
MCV: 98.1 fL (ref 80.0–100.0)
Platelets: 143 10*3/uL — ABNORMAL LOW (ref 150–400)
RBC: 2.09 MIL/uL — ABNORMAL LOW (ref 3.87–5.11)
RDW: 13.6 % (ref 11.5–15.5)
WBC: 3.7 10*3/uL — ABNORMAL LOW (ref 4.0–10.5)
nRBC: 0 % (ref 0.0–0.2)

## 2019-08-21 LAB — RENAL FUNCTION PANEL
Albumin: 2.5 g/dL — ABNORMAL LOW (ref 3.5–5.0)
Anion gap: 11 (ref 5–15)
BUN: 17 mg/dL (ref 6–20)
CO2: 17 mmol/L — ABNORMAL LOW (ref 22–32)
Calcium: 8.3 mg/dL — ABNORMAL LOW (ref 8.9–10.3)
Chloride: 100 mmol/L (ref 98–111)
Creatinine, Ser: 2.72 mg/dL — ABNORMAL HIGH (ref 0.44–1.00)
GFR calc Af Amer: 21 mL/min — ABNORMAL LOW (ref >60)
GFR calc non Af Amer: 19 mL/min — ABNORMAL LOW (ref >60)
Glucose, Bld: 102 mg/dL — ABNORMAL HIGH (ref 70–99)
Phosphorus: 2.9 mg/dL (ref 2.5–4.6)
Potassium: 3.6 mmol/L (ref 3.5–5.1)
Sodium: 128 mmol/L — ABNORMAL LOW (ref 135–145)

## 2019-08-21 LAB — URINE CULTURE: Culture: >=100000 — AB

## 2019-08-21 LAB — PREPARE RBC (CROSSMATCH)

## 2019-08-21 LAB — HCV RNA QUANT
HCV Quantitative Log: 6.386 log10 IU/mL (ref >1.70)
HCV Quantitative: 2430000 IU/mL (ref >50)

## 2019-08-21 LAB — ACTH STIMULATION, 3 TIME POINTS
Cortisol, 30 Min: 25.1 ug/dL
Cortisol, 60 Min: 28.1 ug/dL
Cortisol, Base: 16.5 ug/dL

## 2019-08-21 LAB — MAGNESIUM: Magnesium: 1.4 mg/dL — ABNORMAL LOW (ref 1.7–2.4)

## 2019-08-21 SURGERY — ESOPHAGOGASTRODUODENOSCOPY (EGD) WITH PROPOFOL
Anesthesia: Monitor Anesthesia Care

## 2019-08-21 MED ORDER — MAGNESIUM SULFATE 2 GM/50ML IV SOLN
2.0000 g | Freq: Once | INTRAVENOUS | Status: AC
  Administered 2019-08-21: 2 g via INTRAVENOUS
  Filled 2019-08-21: qty 50

## 2019-08-21 MED ORDER — FUROSEMIDE 40 MG PO TABS: 40.0000 mg | ORAL_TABLET | Freq: Every day | ORAL | Status: DC

## 2019-08-21 MED ORDER — SODIUM BICARBONATE 650 MG PO TABS
1300.0000 mg | ORAL_TABLET | Freq: Two times a day (BID) | ORAL | Status: DC
  Administered 2019-08-21 – 2019-08-23 (×5): 1300 mg via ORAL
  Filled 2019-08-21 (×5): qty 2

## 2019-08-21 MED ORDER — PROPOFOL 500 MG/50ML IV EMUL
INTRAVENOUS | Status: DC | PRN
  Administered 2019-08-21: 100 ug/kg/min via INTRAVENOUS

## 2019-08-21 MED ORDER — SODIUM CHLORIDE 0.9% IV SOLUTION: Freq: Once | INTRAVENOUS | Status: AC

## 2019-08-21 MED ORDER — LACTATED RINGERS IV SOLN: INTRAVENOUS | Status: DC | PRN

## 2019-08-21 MED ORDER — PROPOFOL 10 MG/ML IV BOLUS
INTRAVENOUS | Status: DC | PRN
  Administered 2019-08-21 (×5): 20 mg via INTRAVENOUS

## 2019-08-21 SURGICAL SUPPLY — 15 items

## 2019-08-21 NOTE — Anesthesia Preprocedure Evaluation (Addendum)
Anesthesia Evaluation  Patient identified by MRN, date of birth, ID band Patient awake    Reviewed: Allergy & Precautions, NPO status , Patient's Chart, lab work & pertinent test results  Airway Mallampati: II  TM Distance: >3 FB Neck ROM: Full    Dental  (+) Edentulous Upper, Edentulous Lower   Pulmonary asthma , Current Smoker and Patient abstained from smoking.,    Pulmonary exam normal breath sounds clear to auscultation       Cardiovascular hypertension, Pt. on medications Normal cardiovascular exam Rhythm:Regular Rate:Normal  ECG: SR  ECHO: 1. Left ventricular ejection fraction, by estimation, is 60 to 65%. The left ventricle has normal function. The left ventricle has no regional wall motion abnormalities. Left ventricular diastolic parameters are consistent with Grade II diastolic dysfunction (pseudonormalization). Elevated left ventricular end-diastolic pressure. 2. Right ventricular systolic function is normal. The right ventricular size is normal. There is normal pulmonary artery systolic pressure. 3. Cannot r/o PFO. 4. The mitral valve is normal in structure. Trivial mitral valve regurgitation. No evidence of mitral stenosis. 5. The aortic valve is normal in structure. Aortic valve regurgitation is not visualized. Mild to moderate aortic valve sclerosis/calcification is present, without any evidence of aortic stenosis. 6. The inferior vena cava is normal in size with greater than 50% respiratory variability, suggesting right atrial pressure of 3 mmHg.   Neuro/Psych PSYCHIATRIC DISORDERS Anxiety Depression negative neurological ROS     GI/Hepatic PUD, GERD  ,(+)     substance abuse  , Hepatitis -, C  Endo/Other  negative endocrine ROSdiabetes  Renal/GU CRFRenal diseasehyponatremia     Musculoskeletal negative musculoskeletal ROS (+)   Abdominal   Peds  Hematology  (+) anemia , HLD   Anesthesia  Other Findings  anemia, dark stools, FOBT+, adenoma 03/2016  Reproductive/Obstetrics                            Anesthesia Physical Anesthesia Plan  ASA: III  Anesthesia Plan: MAC   Post-op Pain Management:    Induction: Intravenous  PONV Risk Score and Plan: 1 and Propofol infusion and Treatment may vary due to age or medical condition  Airway Management Planned: Nasal Cannula  Additional Equipment:   Intra-op Plan:   Post-operative Plan:   Informed Consent: I have reviewed the patients History and Physical, chart, labs and discussed the procedure including the risks, benefits and alternatives for the proposed anesthesia with the patient or authorized representative who has indicated his/her understanding and acceptance.       Plan Discussed with: CRNA  Anesthesia Plan Comments:        Anesthesia Quick Evaluation

## 2019-08-21 NOTE — Anesthesia Postprocedure Evaluation (Signed)
Anesthesia Post Note  Patient: [name]  Procedure(s) Performed: ESOPHAGOGASTRODUODENOSCOPY (EGD) WITH PROPOFOL (N/A )     Patient location during evaluation: Endoscopy Anesthesia Type: MAC Level of consciousness: awake and alert Pain management: pain level controlled Vital Signs Assessment: post-procedure vital signs reviewed and stable Respiratory status: spontaneous breathing, nonlabored ventilation, respiratory function stable and patient connected to nasal cannula oxygen Cardiovascular status: stable and blood pressure returned to baseline Postop Assessment: no apparent nausea or vomiting Anesthetic complications: no    Last Vitals:  Vitals:   08/21/19 1242 08/21/19 1257  BP: 126/70 125/67  Pulse: 99 89  Resp: (!) 21 15  Temp: 36.7 C   SpO2: 96% 99%    Last Pain:  Vitals:   08/21/19 1229  TempSrc:   PainSc: 0-No pain                 Saoirse Legere P Sharrie Self

## 2019-08-21 NOTE — Interval H&P Note (Signed)
History and Physical Interval Note:  08/21/2019 10:35 AM  [name]  has presented today for surgery, with the diagnosis of anemia, dark stools, FOBT+, adenoma 03/2016..  The various methods of treatment have been discussed with the patient and family. After consideration of risks, benefits and other options for treatment, the patient has consented to  Procedure(s): ESOPHAGOGASTRODUODENOSCOPY (EGD) WITH PROPOFOL (N/A) as a surgical intervention.  The patient's history has been reviewed, patient examined, no change in status, stable for surgery.  I have reviewed the patient's chart and labs.  Questions were answered to the patient's satisfaction.     Falon Huesca

## 2019-08-21 NOTE — Progress Notes (Signed)
[facility]  PROGRESS NOTE  [name] VXB:[id number] DOB: Sep 21, 1960 DOA: 08/19/2019 PCP: Ladell Pier, MD   Brief HPI:   59 year old female with a history of CKD stage IV, chronic hepatitis C, hypertension, history of duodenal ulcer/GERD, COPD, polysubstance abuse(alcohol/cocaine/marijuana/tobacco) who presented with generalized weakness, worsening lower extremity swelling and dark stool.  Found to have hyponatremia, worsening anemia concern for upper GI bleeding.  Renal ultrasound on 08/19/2019 showed chronic renal medical disease, no mass or hydronephrosis.  Echocardiogram showed EF 60 to 33%, grade 2 diastolic dysfunction.    Subjective   Patient seen and examined, plan for EGD today.  Denies melanotic stools.  Denies nausea or vomiting   Assessment/Plan:     1. Hyponatremia-slowly improving, likely hypervolemic hyponatremia from underlying nephrotic syndrome.  Today sodium is 128.  Nephrology following.  As per nephrology she has chronic hyponatremia, her sodium level usually around 1 26-1 28. 2. GI bleed with acute blood loss anemia-patient underwent EGD today which showed gastritis, nonbleeding gastric ulcers, nonbleeding duodenal ulcers, erythematous duodenopathy.  Recommended Protonix 40 mg p.o. twice daily.  Today hemoglobin is 7.0, will transfuse 1 unit PRBC and follow CBC in a.m. 3. UTI/cystitis-Per nephrology she had pansensitive E. coli on urine culture last week at nephrology office.  Treat with Rocephin for 3 days 4. CKD stage IV-nephrology following, patient has nephrotic range proteinuria.  Also history of HCV there is concern for liver pathology.  However complement levels were within normal limits.  HIV is nonreactive.  ANA is negative. 5. Hypertension-blood pressure stable, and diabetes medications are on hold. 6. Polysubstance abuse/alcohol abuse-no signs of alcohol withdrawal at this time.  Continue CIWA protocol. 7. ACTH stimulation test-ACTH stim test  was done this morning for low borderline cortisol level.  Test had normal response to ACTH with cortisol level 25.1 and 28.1 after 30 and 60 minutes respectively.  Baseline cortisol was 16.5 this morning. 8. Hypomagnesemia-magnesium 1.4, will replace magnesium with 2 g of mag sulfate IV x1.  Follow magnesium level in a.m. 9. Chronic hepatitis C-she will need transfer to ID clinic on discharge.    SpO2: 99 % O2 Flow Rate (L/min): 2 L/min   COVID-19 Labs  Recent Labs    08/19/19 2058  FERRITIN 35    Lab Results  Component Value Date   SARSCOV2NAA NEGATIVE 08/19/2019   SARSCOV2NAA NEGATIVE 01/12/2019   [place] NEGATIVE 12/31/2018   [place] NEGATIVE 07/31/2018     CBG: No results for input(s): GLUCAP in the last 168 hours.  CBC: Recent Labs  Lab 08/19/19 1216 08/19/19 1432 08/19/19 2058 08/20/19 0410 08/21/19 0911  WBC 3.2*  --  2.6* 2.5* 3.7*  HGB 7.8* 7.8* 7.5* 7.2* 7.0*  HCT 22.1* 23.0* 21.7* 20.9* 20.5*  MCV 96.5  --  96.4 96.8 98.1  PLT 143*  --  152 131* 143*    Basic Metabolic Panel: Recent Labs  Lab 08/19/19 2058 08/19/19 2058 08/19/19 2316 08/20/19 0410 08/20/19 0727 08/20/19 1753 08/21/19 0911 08/21/19 1247  NA 126*   < > 125* 125* 128* 126* 128*  --   K 3.5   < > 3.8 3.7 3.4* 4.0 3.6  --   CL 97*   < > 98 97* 97* 98 100  --   CO2 16*   < > 14* 17* 19* 18* 17*  --   GLUCOSE 132*   < > 128* 109* 105* 114* 102*  --   BUN 17   < > 18 20  18 18 17   --   CREATININE 2.47*   < > 2.43* 2.36* 2.46* 2.56* 2.72*  --   CALCIUM 8.7*   < > 8.6* 8.4* 8.8* 8.3* 8.3*  --   MG 1.7  --   --   --   --   --   --  1.4*  PHOS 3.5  --   --   --   --   --  2.9  --    < > = values in this interval not displayed.     Liver Function Tests: Recent Labs  Lab 08/19/19 1216 08/19/19 2058 08/21/19 0911  AST 67* 50*  --   ALT 26 25  --   ALKPHOS 79 77  --   BILITOT 0.3 0.4  --   PROT 7.2 6.7  --   ALBUMIN 3.4* 3.1* 2.5*        DVT prophylaxis:  SCDs  Code Status: Full code  Family Communication: No family at bedside    Status is: Inpatient  Dispo: The patient is from: Home              Anticipated d/c is to: Home              Anticipated d/c date is: 08/23/2019              Patient currently not medically stable for discharge  Barrier to discharge-ongoing treatment for hyponatremia.  EGD done today.        Scheduled medications:  . atorvastatin  5 mg Oral Daily  . feeding supplement  1 Container Oral TID BM  . folic acid  1 mg Oral Daily  . gabapentin  100 mg Oral TID  . LORazepam  0-4 mg Intravenous Q6H   Followed by  . LORazepam  0-4 mg Intravenous Q12H  . multivitamin with minerals  1 tablet Oral Daily  . mupirocin cream   Topical BID  . pantoprazole (PROTONIX) IV  40 mg Intravenous Q12H  . sodium bicarbonate  1,300 mg Oral BID  . sodium chloride flush  3 mL Intravenous Q12H  . thiamine  100 mg Oral Daily   Or  . thiamine  100 mg Intravenous Daily    Consultants:  Gastroenterology  Procedures:  EGD  Antibiotics:   Anti-infectives (From admission, onward)   Start     Dose/Rate Route Frequency Ordered Stop   08/19/19 1915  cefTRIAXone (ROCEPHIN) 1 g in sodium chloride 0.9 % 100 mL IVPB     1 g 200 mL/hr over 30 Minutes Intravenous Every 24 hours 08/19/19 1900 08/22/19 1914       Objective   Vitals:   08/21/19 1220 08/21/19 1229 08/21/19 1242 08/21/19 1257  BP: (!) 99/50 (!) 116/52 126/70 125/67  Pulse: 83  99 89  Resp: 14 (!) 21 (!) 21 15  Temp:   98 F (36.7 C)   TempSrc:      SpO2: 94% 94% 96% 99%  Weight:      Height:        Intake/Output Summary (Last 24 hours) at 08/21/2019 1634 Last data filed at 08/21/2019 1204 Gross per 24 hour  Intake 100 ml  Output 1500 ml  Net -1400 ml    06/03 1901 - 06/05 0700 In: 1442.8 [P.O.:1090; I.V.:252.4] Out: 3100 [Urine:3100]  Filed Weights   08/20/19 0323 08/21/19 0342 08/21/19 1103  Weight: 62.1 kg 63 kg 59.9 kg    Physical  Examination:    General: Appears  in no acute distress  Cardiovascular: S1-S2, regular, no murmur auscultated  Respiratory: Clear to auscultation bilaterally  Abdomen: Abdomen is soft, nontender, no organomegaly  Extremities: No edema in the lower extremities noted  Neurologic: Alert, oriented x3, intact insight and judgment    Data Reviewed:   Recent Results (from the past 240 hour(s))  SARS Coronavirus 2 by RT PCR (hospital order, performed in [facility] hospital lab) Nasopharyngeal Nasopharyngeal Swab     Status: None   Collection Time: 08/19/19  3:12 PM   Specimen: Nasopharyngeal Swab  Result Value Ref Range Status   SARS Coronavirus 2 NEGATIVE NEGATIVE Final    Comment: (NOTE) SARS-CoV-2 target nucleic acids are NOT DETECTED. The SARS-CoV-2 RNA is generally detectable in upper and lower respiratory specimens during the acute phase of infection. The lowest concentration of SARS-CoV-2 viral copies this assay can detect is 250 copies / mL. A negative result does not preclude SARS-CoV-2 infection and should not be used as the sole basis for treatment or other patient management decisions.  A negative result may occur with improper specimen collection / handling, submission of specimen other than nasopharyngeal swab, presence of viral mutation(s) within the areas targeted by this assay, and inadequate number of viral copies (<250 copies / mL). A negative result must be combined with clinical observations, patient history, and epidemiological information. Fact Sheet for Patients:   StrictlyIdeas.no Fact Sheet for Healthcare Providers: BankingDealers.co.za This test is not yet approved or cleared  by the [place] FDA and has been authorized for detection and/or diagnosis of SARS-CoV-2 by FDA under an Emergency Use Authorization (EUA).  This EUA will remain in effect (meaning this test can be used) for the duration of  the COVID-19 declaration under Section 564(b)(1) of the Act, 21 U.S.C. section 360bbb-3(b)(1), unless the authorization is terminated or revoked sooner. Performed at [facility], [place] [address] [place], [place] [address]   Urine culture     Status: Abnormal   Collection Time: 08/19/19  4:30 PM   Specimen: Urine, Catheterized  Result Value Ref Range Status   Specimen Description URINE, CATHETERIZED  Final   Special Requests   Final    NONE Performed at [facility], [address], [place], [place] [address]    Culture >=100,000 COLONIES/mL ESCHERICHIA COLI (A)  Final   Report Status 08/21/2019 FINAL  Final   Organism ID, Bacteria ESCHERICHIA COLI (A)  Final      Susceptibility   Escherichia coli - MIC*    AMPICILLIN 8 SENSITIVE Sensitive     CEFAZOLIN <=4 SENSITIVE Sensitive     CEFTRIAXONE <=1 SENSITIVE Sensitive     CIPROFLOXACIN <=0.25 SENSITIVE Sensitive     GENTAMICIN <=1 SENSITIVE Sensitive     IMIPENEM <=0.25 SENSITIVE Sensitive     NITROFURANTOIN <=16 SENSITIVE Sensitive     TRIMETH/SULFA <=20 SENSITIVE Sensitive     AMPICILLIN/SULBACTAM 4 SENSITIVE Sensitive     PIP/TAZO <=4 SENSITIVE Sensitive     * >=100,000 COLONIES/mL ESCHERICHIA COLI    No results for input(s): LIPASE, AMYLASE in the last 168 hours. No results for input(s): AMMONIA in the last 168 hours.  Cardiac Enzymes: No results for input(s): CKTOTAL, CKMB, CKMBINDEX, TROPONINI in the last 168 hours. BNP (last 3 results) Recent Labs    08/19/19 1216 08/19/19 2058  BNP 136.4* 249.4*    ProBNP (last 3 results) No results for input(s): PROBNP in the last 8760 hours.  Studies:  US RENAL  Result  Date: 08/19/2019 CLINICAL DATA:  Chronic kidney disease stage 4 EXAM: RENAL / URINARY TRACT ULTRASOUND COMPLETE COMPARISON:  None. FINDINGS: Right Kidney: Renal measurements: 7.0 x 3.8 x 3.9 cm = volume: 54 mL. Increased echotexture. No mass or hydronephrosis. Left Kidney: Renal measurements: 8.3  x 3.9 x 4.6 cm = volume: 79 mL. Increased echotexture. No mass or hydronephrosis. Bladder: Appears normal for degree of bladder distention. Other: None. IMPRESSION: Small, echogenic kidneys bilaterally compatible with chronic medical renal disease. No acute findings. Electronically Signed   By: Rolm Baptise M.D.   On: 08/19/2019 18:00   ECHOCARDIOGRAM COMPLETE  Result Date: 08/20/2019    ECHOCARDIOGRAM REPORT   Patient Name:   NIXIE LAUBE Posner Date of Exam: 08/20/2019 Medical Rec #:  [id number]      Height:       67.0 in Accession #:    [id number]     Weight:       136.9 lb Date of Birth:  03-04-1961      BSA:          1.721 m Patient Age:    89 years       BP:           111/75 mmHg Patient Gender: F              HR:           85 bpm. Exam Location:  Inpatient Procedure: 2D Echo, Cardiac Doppler and Color Doppler Indications:    Dyspnea  History:        Patient has prior history of Echocardiogram examinations, most                 recent 06/08/2016. Risk Factors:Hypertension and Current Smoker.                 Polysubstance abuse. CKD.  Sonographer:    Clayton Lefort RDCS (AE) Referring Phys: [place]  1. Left ventricular ejection fraction, by estimation, is 60 to 65%. The left ventricle has normal function. The left ventricle has no regional wall motion abnormalities. Left ventricular diastolic parameters are consistent with Grade II diastolic dysfunction (pseudonormalization). Elevated left ventricular end-diastolic pressure.  2. Right ventricular systolic function is normal. The right ventricular size is normal. There is normal pulmonary artery systolic pressure.  3. Cannot r/o PFO.  4. The mitral valve is normal in structure. Trivial mitral valve regurgitation. No evidence of mitral stenosis.  5. The aortic valve is normal in structure. Aortic valve regurgitation is not visualized. Mild to moderate aortic valve sclerosis/calcification is present, without any evidence of aortic stenosis.  6.  The inferior vena cava is normal in size with greater than 50% respiratory variability, suggesting right atrial pressure of 3 mmHg. FINDINGS  Left Ventricle: Left ventricular ejection fraction, by estimation, is 60 to 65%. The left ventricle has normal function. The left ventricle has no regional wall motion abnormalities. The left ventricular internal cavity size was normal in size. There is  no left ventricular hypertrophy. Left ventricular diastolic parameters are consistent with Grade II diastolic dysfunction (pseudonormalization). Elevated left ventricular end-diastolic pressure. Right Ventricle: The right ventricular size is normal. No increase in right ventricular wall thickness. Right ventricular systolic function is normal. There is normal pulmonary artery systolic pressure. The tricuspid regurgitant velocity is 2.16 m/s, and  with an assumed right atrial pressure of 3 mmHg, the estimated right ventricular systolic pressure is 37.1 mmHg. Left Atrium: Left atrial size was normal in  size. Right Atrium: Right atrial size was normal in size. Pericardium: There is no evidence of pericardial effusion. Mitral Valve: The mitral valve is normal in structure. There is mild thickening of the mitral valve leaflet(s). There is mild calcification of the mitral valve leaflet(s). Normal mobility of the mitral valve leaflets. Trivial mitral valve regurgitation. No evidence of mitral valve stenosis. MV peak gradient, 10.6 mmHg. The mean mitral valve gradient is 4.0 mmHg. Tricuspid Valve: The tricuspid valve is normal in structure. Tricuspid valve regurgitation is not demonstrated. No evidence of tricuspid stenosis. Aortic Valve: The aortic valve is normal in structure. Aortic valve regurgitation is not visualized. Mild to moderate aortic valve sclerosis/calcification is present, without any evidence of aortic stenosis. Aortic valve mean gradient measures 7.0 mmHg. Aortic valve peak gradient measures 14.0 mmHg. Aortic valve  area, by VTI measures 1.74 cm. Pulmonic Valve: The pulmonic valve was normal in structure. Pulmonic valve regurgitation is not visualized. No evidence of pulmonic stenosis. Aorta: The aortic root is normal in size and structure. Venous: The inferior vena cava is normal in size with greater than 50% respiratory variability, suggesting right atrial pressure of 3 mmHg. IAS/Shunts: The interatrial septum was not well visualized.  LEFT VENTRICLE PLAX 2D LVIDd:         3.50 cm  Diastology LVIDs:         2.10 cm  LV e' lateral:   8.16 cm/s LV PW:         1.20 cm  LV E/e' lateral: 15.1 LV IVS:        1.00 cm  LV e' medial:    11.90 cm/s LVOT diam:     1.80 cm  LV E/e' medial:  10.3 LV SV:         53 LV SV Index:   31 LVOT Area:     2.54 cm  RIGHT VENTRICLE             IVC RV Basal diam:  1.90 cm     IVC diam: 1.80 cm RV S prime:     15.70 cm/s TAPSE (M-mode): 2.9 cm LEFT ATRIUM           Index       RIGHT ATRIUM          Index LA diam:      2.80 cm 1.63 cm/m  RA Area:     9.44 cm LA Vol (A4C): 57.9 ml 33.64 ml/m RA Volume:   17.40 ml 10.11 ml/m  AORTIC VALVE AV Area (Vmax):    1.80 cm AV Area (Vmean):   1.85 cm AV Area (VTI):     1.74 cm AV Vmax:           187.00 cm/s AV Vmean:          121.000 cm/s AV VTI:            0.305 m AV Peak Grad:      14.0 mmHg AV Mean Grad:      7.0 mmHg LVOT Vmax:         132.00 cm/s LVOT Vmean:        88.200 cm/s LVOT VTI:          0.208 m LVOT/AV VTI ratio: 0.68  AORTA Ao Root diam: 3.10 cm MITRAL VALVE                TRICUSPID VALVE MV Area (PHT): 3.27 cm     TR Peak grad:   18.7 mmHg  MV Peak grad:  10.6 mmHg    TR Vmax:        216.00 cm/s MV Mean grad:  4.0 mmHg MV Vmax:       1.63 m/s     SHUNTS MV Vmean:      98.6 cm/s    Systemic VTI:  0.21 m MV Decel Time: 232 msec     Systemic Diam: 1.80 cm MV E velocity: 123.00 cm/s MV A velocity: 109.00 cm/s MV E/A ratio:  1.13 Jenkins Rouge MD Electronically signed by Jenkins Rouge MD Signature Date/Time: 08/20/2019/9:53:20 AM    Final         Oswald Hillock   [facility] If 7PM-7AM, please contact night-coverage at [email or web address], Office  [phone]   08/21/2019, 4:34 PM  LOS: 2 days

## 2019-08-21 NOTE — Progress Notes (Signed)
[facility] NEPHROLOGY PROGRESS NOTE  Assessment/ Plan: Pt is a 59 y.o. yo female with HTN, polynephritis, polysubstance abuse, hepatitis C infection not treated, GI bleed, CKD 4 with nephrotic syndrome follows with Dr. Hollie Salk, last creatinine level 2.19, Na 128 on 5/26 at [facility] admitted with leg weakness, pain, edema and UTI.  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  Spot UPC 2.4 in the hospital.  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  ANA is weakly positive 1: 40, not significant.  RA, C3, C4 negative.  Pending hep C RNA level.  US renal with small bilateral echogenic kidneys.  Probably no benefit of biopsy at this time.  Creatinine remains stable.  She is nonoliguric and no uremic feature.  #Hyponatremia, hypervolemic probably due to poor oral intake/beer portal mania: Both urine sodium and osmolality low. Received a dose of Lasix 80 mg on admission with improvement of sodium to 128. She has chronic hyponatremia.  Sodium level around 126-128.  Encouraged to increase oral intake.  Euvolemic and she is currently n.p.o. therefore holding diuretics.  #Hypokalemia: Replete potassium chloride.  #Metabolic acidosis: Chronic issue, start sodium bicarbonate.  #E. coli UTI: On ceftriaxone.  #Acute blood loss anemia probably due to GI bleed: GI is following plan for endoscopy today.  Transfuse as needed.  #Chronic hepatitis C infection: Follow-up hepatitis C RNA.  GI is following.  # HTN/volume: Blood pressure acceptable.  Amlodipine on hold.  Subjective: Seen and examined at bedside.  N.p.o. for endoscopy.  Denies nausea vomiting chest pain shortness of breath.  Urine output around 2000 cc. Objective Vital signs in last 24 hours: Vitals:   08/21/19 0000 08/21/19 0342 08/21/19 0400 08/21/19 0600  BP: 119/67 133/71 127/63   Pulse: 93 92 92 98  Resp: 14 14 17    Temp: 99.1 F (37.3 C) 98.8 F (37.1 C) 98.4 F (36.9 C)   TempSrc: Oral Oral Oral    SpO2: 93% 91% 92%   Weight:  63 kg    Height:       Weight change: 3.125 kg  Intake/Output Summary (Last 24 hours) at 08/21/2019 1037 Last data filed at 08/21/2019 0500 Gross per 24 hour  Intake 252.01 ml  Output 2000 ml  Net -1747.99 ml       Labs: Basic Metabolic Panel: Recent Labs  Lab 08/19/19 2058 08/19/19 2316 08/20/19 0727 08/20/19 1753 08/21/19 0911  NA 126*   < > 128* 126* 128*  K 3.5   < > 3.4* 4.0 3.6  CL 97*   < > 97* 98 100  CO2 16*   < > 19* 18* 17*  GLUCOSE 132*   < > 105* 114* 102*  BUN 17   < > 18 18 17   CREATININE 2.47*   < > 2.46* 2.56* 2.72*  CALCIUM 8.7*   < > 8.8* 8.3* 8.3*  PHOS 3.5  --   --   --  2.9   < > = values in this interval not displayed.   Liver Function Tests: Recent Labs  Lab 08/19/19 1216 08/19/19 2058 08/21/19 0911  AST 67* 50*  --   ALT 26 25  --   ALKPHOS 79 77  --   BILITOT 0.3 0.4  --   PROT 7.2 6.7  --   ALBUMIN 3.4* 3.1* 2.5*   No results for input(s): LIPASE, AMYLASE in the last 168 hours. No results for input(s): AMMONIA in the last 168 hours. CBC: Recent Labs  Lab 08/19/19 1216 08/19/19 1432 08/19/19 2058 08/20/19 0410 08/21/19 0911  WBC 3.2*   < > 2.6* 2.5* 3.7*  HGB 7.8*   < > 7.5* 7.2* 7.0*  HCT 22.1*   < > 21.7* 20.9* 20.5*  MCV 96.5  --  96.4 96.8 98.1  PLT 143*   < > 152 131* 143*   < > = values in this interval not displayed.   Cardiac Enzymes: No results for input(s): CKTOTAL, CKMB, CKMBINDEX, TROPONINI in the last 168 hours. CBG: No results for input(s): GLUCAP in the last 168 hours.  Iron Studies:  Recent Labs    08/19/19 2058  IRON 32  TIBC 396  FERRITIN 35   Studies/Results: US RENAL  Result Date: 08/19/2019 CLINICAL DATA:  Chronic kidney disease stage 4 EXAM: RENAL / URINARY TRACT ULTRASOUND COMPLETE COMPARISON:  None. FINDINGS: Right Kidney: Renal measurements: 7.0 x 3.8 x 3.9 cm = volume: 54 mL. Increased echotexture. No mass or hydronephrosis. Left Kidney: Renal measurements:  8.3 x 3.9 x 4.6 cm = volume: 79 mL. Increased echotexture. No mass or hydronephrosis. Bladder: Appears normal for degree of bladder distention. Other: None. IMPRESSION: Small, echogenic kidneys bilaterally compatible with chronic medical renal disease. No acute findings. Electronically Signed   By: Rolm Baptise M.D.   On: 08/19/2019 18:00   DG Chest Portable 1 View  Result Date: 08/19/2019 CLINICAL DATA:  Shortness of breath. EXAM: PORTABLE CHEST 1 VIEW COMPARISON:  01/12/2019 FINDINGS: The heart size and mediastinal contours are within normal limits. Both lungs are clear. The visualized skeletal structures are unremarkable. IMPRESSION: Normal exam. Electronically Signed   By: Lorriane Shire M.D.   On: 08/19/2019 14:38   ECHOCARDIOGRAM COMPLETE  Result Date: 08/20/2019    ECHOCARDIOGRAM REPORT   Patient Name:   [name] [place] Date of Exam: 08/20/2019 Medical Rec #:  [id number]      Height:       67.0 in Accession #:    [id number]     Weight:       136.9 lb Date of Birth:  07-03-1960      BSA:          1.721 m Patient Age:    27 years       BP:           111/75 mmHg Patient Gender: F              HR:           85 bpm. Exam Location:  Inpatient Procedure: 2D Echo, Cardiac Doppler and Color Doppler Indications:    Dyspnea  History:        Patient has prior history of Echocardiogram examinations, most                 recent 06/08/2016. Risk Factors:Hypertension and Current Smoker.                 Polysubstance abuse. CKD.  Sonographer:    Clayton Lefort RDCS (AE) Referring Phys: [place]  1. Left ventricular ejection fraction, by estimation, is 60 to 65%. The left ventricle has normal function. The left ventricle has no regional wall motion abnormalities. Left ventricular diastolic parameters are consistent with Grade II diastolic dysfunction (pseudonormalization). Elevated left ventricular end-diastolic pressure.  2. Right ventricular systolic function is normal. The right ventricular size is  normal. There is normal pulmonary artery systolic pressure.  3. Cannot r/o PFO.  4. The mitral valve is normal  in structure. Trivial mitral valve regurgitation. No evidence of mitral stenosis.  5. The aortic valve is normal in structure. Aortic valve regurgitation is not visualized. Mild to moderate aortic valve sclerosis/calcification is present, without any evidence of aortic stenosis.  6. The inferior vena cava is normal in size with greater than 50% respiratory variability, suggesting right atrial pressure of 3 mmHg. FINDINGS  Left Ventricle: Left ventricular ejection fraction, by estimation, is 60 to 65%. The left ventricle has normal function. The left ventricle has no regional wall motion abnormalities. The left ventricular internal cavity size was normal in size. There is  no left ventricular hypertrophy. Left ventricular diastolic parameters are consistent with Grade II diastolic dysfunction (pseudonormalization). Elevated left ventricular end-diastolic pressure. Right Ventricle: The right ventricular size is normal. No increase in right ventricular wall thickness. Right ventricular systolic function is normal. There is normal pulmonary artery systolic pressure. The tricuspid regurgitant velocity is 2.16 m/s, and  with an assumed right atrial pressure of 3 mmHg, the estimated right ventricular systolic pressure is 68.0 mmHg. Left Atrium: Left atrial size was normal in size. Right Atrium: Right atrial size was normal in size. Pericardium: There is no evidence of pericardial effusion. Mitral Valve: The mitral valve is normal in structure. There is mild thickening of the mitral valve leaflet(s). There is mild calcification of the mitral valve leaflet(s). Normal mobility of the mitral valve leaflets. Trivial mitral valve regurgitation. No evidence of mitral valve stenosis. MV peak gradient, 10.6 mmHg. The mean mitral valve gradient is 4.0 mmHg. Tricuspid Valve: The tricuspid valve is normal in structure.  Tricuspid valve regurgitation is not demonstrated. No evidence of tricuspid stenosis. Aortic Valve: The aortic valve is normal in structure. Aortic valve regurgitation is not visualized. Mild to moderate aortic valve sclerosis/calcification is present, without any evidence of aortic stenosis. Aortic valve mean gradient measures 7.0 mmHg. Aortic valve peak gradient measures 14.0 mmHg. Aortic valve area, by VTI measures 1.74 cm. Pulmonic Valve: The pulmonic valve was normal in structure. Pulmonic valve regurgitation is not visualized. No evidence of pulmonic stenosis. Aorta: The aortic root is normal in size and structure. Venous: The inferior vena cava is normal in size with greater than 50% respiratory variability, suggesting right atrial pressure of 3 mmHg. IAS/Shunts: The interatrial septum was not well visualized.  LEFT VENTRICLE PLAX 2D LVIDd:         3.50 cm  Diastology LVIDs:         2.10 cm  LV e' lateral:   8.16 cm/s LV PW:         1.20 cm  LV E/e' lateral: 15.1 LV IVS:        1.00 cm  LV e' medial:    11.90 cm/s LVOT diam:     1.80 cm  LV E/e' medial:  10.3 LV SV:         53 LV SV Index:   31 LVOT Area:     2.54 cm  RIGHT VENTRICLE             IVC RV Basal diam:  1.90 cm     IVC diam: 1.80 cm RV S prime:     15.70 cm/s TAPSE (M-mode): 2.9 cm LEFT ATRIUM           Index       RIGHT ATRIUM          Index LA diam:      2.80 cm 1.63 cm/m  RA Area:  9.44 cm LA Vol (A4C): 57.9 ml 33.64 ml/m RA Volume:   17.40 ml 10.11 ml/m  AORTIC VALVE AV Area (Vmax):    1.80 cm AV Area (Vmean):   1.85 cm AV Area (VTI):     1.74 cm AV Vmax:           187.00 cm/s AV Vmean:          121.000 cm/s AV VTI:            0.305 m AV Peak Grad:      14.0 mmHg AV Mean Grad:      7.0 mmHg LVOT Vmax:         132.00 cm/s LVOT Vmean:        88.200 cm/s LVOT VTI:          0.208 m LVOT/AV VTI ratio: 0.68  AORTA Ao Root diam: 3.10 cm MITRAL VALVE                TRICUSPID VALVE MV Area (PHT): 3.27 cm     TR Peak grad:   18.7 mmHg MV  Peak grad:  10.6 mmHg    TR Vmax:        216.00 cm/s MV Mean grad:  4.0 mmHg MV Vmax:       1.63 m/s     SHUNTS MV Vmean:      98.6 cm/s    Systemic VTI:  0.21 m MV Decel Time: 232 msec     Systemic Diam: 1.80 cm MV E velocity: 123.00 cm/s MV A velocity: 109.00 cm/s MV E/A ratio:  1.13 Jenkins Rouge MD Electronically signed by Jenkins Rouge MD Signature Date/Time: 08/20/2019/9:53:20 AM    Final     Medications: Infusions: . sodium chloride Stopped (08/20/19 0609)  . cefTRIAXone (ROCEPHIN)  IV 1 g (08/20/19 2223)    Scheduled Medications: . atorvastatin  5 mg Oral Daily  . feeding supplement  1 Container Oral TID BM  . folic acid  1 mg Oral Daily  . furosemide  40 mg Oral Daily  . gabapentin  100 mg Oral TID  . LORazepam  0-4 mg Intravenous Q6H   Followed by  . LORazepam  0-4 mg Intravenous Q12H  . multivitamin with minerals  1 tablet Oral Daily  . mupirocin cream   Topical BID  . pantoprazole (PROTONIX) IV  40 mg Intravenous Q12H  . sodium chloride flush  3 mL Intravenous Q12H  . thiamine  100 mg Oral Daily   Or  . thiamine  100 mg Intravenous Daily    have reviewed scheduled and prn medications.  Physical Exam: General:NAD, comfortable Heart:RRR, s1s2 nl Lungs: Clear b/l, no crackle Abdomen:soft, Non-tender, non-distended Extremities:No LE edema Neurology: Alert awake and following command.  No asterixis  Altan Kraai Prasad Rosalea Withrow 08/21/2019,10:37 AM  LOS: 2 days  Pager: [phone]

## 2019-08-21 NOTE — Progress Notes (Signed)
Patient back from endo alert and oriented x4. VSS on room air. No c/o pain. Pt is currently ordering meal tray for lunch. Will continue to monitor.

## 2019-08-21 NOTE — Transfer of Care (Signed)
Immediate Anesthesia Transfer of Care Note  Patient: [name]  Procedure(s) Performed: ESOPHAGOGASTRODUODENOSCOPY (EGD) WITH PROPOFOL (N/A )  Patient Location: Endoscopy Unit  Anesthesia Type:MAC  Level of Consciousness: awake, alert  and oriented  Airway & Oxygen Therapy: Patient Spontanous Breathing  Post-op Assessment: Report given to RN and Post -op Vital signs reviewed and stable  Post vital signs: Reviewed and stable  Last Vitals:  Vitals Value Taken Time  BP 87/51 08/21/19 1210  Temp    Pulse 91 08/21/19 1211  Resp 20 08/21/19 1211  SpO2 98 % 08/21/19 1211  Vitals shown include unvalidated device data.  Last Pain:  Vitals:   08/21/19 1103  TempSrc: Oral  PainSc: 0-No pain      Patients Stated Pain Goal: 5 (69/62/95 2841)  Complications: No apparent anesthesia complications

## 2019-08-21 NOTE — Op Note (Signed)
[facility] Patient Name: [name] Procedure Date : 08/21/2019 MRN: [id number] Attending MD: Mauri Pole , MD Date of Birth: January 27, 1961 CSN: [id number] Age: 59 Admit Type: Inpatient Procedure:                Upper GI endoscopy Indications:              Suspected upper gastrointestinal bleeding,                            Suspected upper gastrointestinal bleeding in                            patient with chronic blood loss Providers:                Mauri Pole, MD, Elmer Ramp. Tilden Dome, RN,                            Laverda Sorenson, Technician, Clearnce Sorrel, CRNA Referring MD:              Medicines:                Monitored Anesthesia Care Complications:            No immediate complications. Estimated Blood Loss:     Estimated blood loss was minimal. Procedure:                Pre-Anesthesia Assessment:                           - Prior to the procedure, a History and Physical                            was performed, and patient medications and                            allergies were reviewed. The patient's tolerance of                            previous anesthesia was also reviewed. The risks                            and benefits of the procedure and the sedation                            options and risks were discussed with the patient.                            All questions were answered, and informed consent                            was obtained. Prior Anticoagulants: The patient has                            taken no previous anticoagulant or antiplatelet  agents. ASA Grade Assessment: III - A patient with                            severe systemic disease. After reviewing the risks                            and benefits, the patient was deemed in                            satisfactory condition to undergo the procedure.                           After obtaining informed consent, the endoscope was                             passed under direct vision. Throughout the                            procedure, the patient's blood pressure, pulse, and                            oxygen saturations were monitored continuously. The                            GIF-H190 ([id number] Olympus gastroscope was                            introduced through the mouth, and advanced to the                            second part of duodenum. The upper GI endoscopy was                            accomplished without difficulty. The patient                            tolerated the procedure well. Scope In: Scope Out: Findings:      No gross lesions were noted in the entire esophagus.      The Z-line was regular and was found 36 cm from the incisors.      Diffuse mild inflammation characterized by congestion (edema) and       erythema was found in the entire examined stomach.      Three non-bleeding superficial gastric ulcers with no stigmata of       bleeding were found in the gastric antrum and in the prepyloric region       of the stomach. The largest lesion was 3 mm in largest dimension.      Two non-bleeding superficial duodenal ulcers with no stigmata of       bleeding were found in the duodenal bulb. The largest lesion was 2 mm in       largest dimension.      Patchy moderately erythematous mucosa without active bleeding was found       in the first portion of the duodenum. Impression:               -  No gross lesions in esophagus.                           - Z-line regular, 36 cm from the incisors.                           - Gastritis.                           - Non-bleeding gastric ulcers with no stigmata of                            bleeding.                           - Non-bleeding duodenal ulcers with no stigmata of                            bleeding.                           - Erythematous duodenopathy.                           - No specimens collected. Recommendation:           - Patient has a contact  number available for                            emergencies. The signs and symptoms of potential                            delayed complications were discussed with the                            patient. Return to normal activities tomorrow.                            Written discharge instructions were provided to the                            patient.                           - Resume previous diet.                           - Continue present medications.                           - Protonix 40mg  BID                           - Monitor Hgb and transfuse as needed                           - Alcohol cessation                           -  Follow up with ID as outpatient for treatment of                            chronic Hepatitis C                           - GI will sign off, available if have any questions Procedure Code(s):        --- Professional ---                           [phone], Esophagogastroduodenoscopy, flexible,                            transoral; diagnostic, including collection of                            specimen(s) by brushing or washing, when performed                            (separate procedure) Diagnosis Code(s):        --- Professional ---                           K29.70, Gastritis, unspecified, without bleeding                           K25.9, Gastric ulcer, unspecified as acute or                            chronic, without hemorrhage or perforation                           K26.9, Duodenal ulcer, unspecified as acute or                            chronic, without hemorrhage or perforation                           K31.89, Other diseases of stomach and duodenum                           R58, Hemorrhage, not elsewhere classified CPT copyright 2019 [employer]. All rights reserved. The codes documented in this report are preliminary and upon coder review may  be revised to meet current compliance requirements. Mauri Pole,  MD 08/21/2019 12:16:43 PM This report has been signed electronically. Number of Addenda: 0

## 2019-08-22 LAB — RENAL FUNCTION PANEL
Albumin: 2.6 g/dL — ABNORMAL LOW (ref 3.5–5.0)
Anion gap: 15 (ref 5–15)
BUN: 22 mg/dL — ABNORMAL HIGH (ref 6–20)
CO2: 17 mmol/L — ABNORMAL LOW (ref 22–32)
Calcium: 8.4 mg/dL — ABNORMAL LOW (ref 8.9–10.3)
Chloride: 97 mmol/L — ABNORMAL LOW (ref 98–111)
Creatinine, Ser: 2.65 mg/dL — ABNORMAL HIGH (ref 0.44–1.00)
GFR calc Af Amer: 22 mL/min — ABNORMAL LOW (ref >60)
GFR calc non Af Amer: 19 mL/min — ABNORMAL LOW (ref >60)
Glucose, Bld: 143 mg/dL — ABNORMAL HIGH (ref 70–99)
Phosphorus: 2.9 mg/dL (ref 2.5–4.6)
Potassium: 3.6 mmol/L (ref 3.5–5.1)
Sodium: 129 mmol/L — ABNORMAL LOW (ref 135–145)

## 2019-08-22 LAB — CBC
HCT: 24.8 % — ABNORMAL LOW (ref 36.0–46.0)
Hemoglobin: 8.5 g/dL — ABNORMAL LOW (ref 12.0–15.0)
MCH: 33.5 pg (ref 26.0–34.0)
MCHC: 34.3 g/dL (ref 30.0–36.0)
MCV: 97.6 fL (ref 80.0–100.0)
Platelets: 143 10*3/uL — ABNORMAL LOW (ref 150–400)
RBC: 2.54 MIL/uL — ABNORMAL LOW (ref 3.87–5.11)
RDW: 14.7 % (ref 11.5–15.5)
WBC: 5.2 10*3/uL (ref 4.0–10.5)
nRBC: 0 % (ref 0.0–0.2)

## 2019-08-22 LAB — TYPE AND SCREEN
ABO/RH(D): A POS
Antibody Screen: NEGATIVE
Unit division: 0

## 2019-08-22 LAB — BPAM RBC
Blood Product Expiration Date: 202106082359
ISSUE DATE / TIME: 202106052117
Unit Type and Rh: 600

## 2019-08-22 LAB — MAGNESIUM: Magnesium: 2 mg/dL (ref 1.7–2.4)

## 2019-08-22 MED ORDER — NICOTINE 21 MG/24HR TD PT24
21.0000 mg | MEDICATED_PATCH | Freq: Every day | TRANSDERMAL | Status: DC
  Administered 2019-08-22 – 2019-08-23 (×2): 21 mg via TRANSDERMAL
  Filled 2019-08-22 (×2): qty 1

## 2019-08-22 MED ORDER — LORAZEPAM 1 MG PO TABS: 1.0000 mg | ORAL_TABLET | Freq: Four times a day (QID) | ORAL | Status: DC

## 2019-08-22 MED ORDER — LORAZEPAM 2 MG/ML IJ SOLN: 1.0000 mg | Freq: Four times a day (QID) | INTRAMUSCULAR | Status: DC

## 2019-08-22 MED ORDER — OXYCODONE HCL 5 MG PO TABS: 5.0000 mg | ORAL_TABLET | Freq: Four times a day (QID) | ORAL | Status: DC | PRN

## 2019-08-22 NOTE — Progress Notes (Signed)
PROGRESS NOTE        PATIENT DETAILS Name: [name] Age: 59 y.o. Sex: female Date of Birth: [birth year] Admit Date: 08/19/2019 Admitting Physician Evalee Mutton Kristeen Mans, MD [id number], Dalbert Batman, MD  Brief Narrative: Patient is a 59 y.o. female with history of CKD stage IV, chronic hepatitis C, HTN, history of duodenal ulcer/GERD, COPD, polysubstance abuse (alcohol/cocaine/marijuana/tobacco) who presented with generalized weakness, worsening lower extremity swelling, and dark stools-found to have hyponatremia, worsening anemia with concern for upper GI bleeding-and subsequently admitted to the hospitalist service.  See below for further details.  Significant events: 6/3>> admit to [facility] for hyponatremia/upper GI bleeding with acute blood loss anemia.  Significant studies: 6/3: Chest x-ray>> no PNA 6/3: Renal ultrasound>>chronic renal medical disease, no mass or hydronephrosis. 6/4: Echo>> EF 34-19%, grade 2 diastolic dysfunction  Antimicrobial therapy: Rocephin: 6/3>>  Microbiology data: None  Procedures : None  Consults: Nephrology, GI  DVT Prophylaxis :  SCD's  Subjective:  Patient in bed, appears comfortable, denies any headache, no fever, no chest pain or pressure, no shortness of breath , no abdominal pain. No focal weakness.   Assessment/Plan:  Hyponatremia: Secondary to hypervolemia likely related to underlying nephrotic syndrome.  Improving with IV diuretics-await further recommendations from nephrology.  In the meantime we will go and stop Lasix.  She is asymptomatic.  Probable upper GI bleeding with acute blood loss anemia: Continue PPI-but per GI-okay to change to twice daily dosing.  No further melanotic stool since admission-but around 2 g drop of hemoglobin from lab work done last week at nephrology office (Hb 9.4).  EGD showed gastritis and nonbleeding duodenal and gastric ulcers.  Clinically stable we will continue to  monitor.  UTI/cystitis: Per nephrology-had pansensitive E. coli on urine culture last week at nephrology office-on Rocephin x3 days.  CKD stage IV baseline creatinine of 2.6: Per nephrology-almost had nephrotic range proteinuria and a lab work done last week at nephrology office.  Has history of HCV-and hence concern for glomerular pathology-however complement levels within normal limits.  HIV nonreactive.  A1c 5.6, ANA negative and nephrology following.  Anasarca: Related to nephrotic syndrome-also has history of liver disease/hepatitis C-no overt stigmata of liver cirrhosis apparent.  Echo with preserved EF-but does have grade 2 diastolic dysfunction.  Significant improvement in edema after 1 dose of 80 mg of IV Lasix given in the ED yesterday.  Lasix held-await further input from nephrology.  HTN: BP stable currently-follow-all antihypertensives remain on hold.  Chronic low back pain/bilateral knee pain: Supportive care for now.  Polysubstance abuse (alcohol/cocaine/marijuana/tobacco use): no signs of alcohol withdrawal at this time-on Ativan per protocol.  Equivocal/borderline low a.m. cortisol level: Stable cortisol stim test.  Chronic hepatitis C: Per patient-she has never been treated-I am not sure if she is keen on pursuing treatment-but will need referral to the ID clinic on discharge.  Diet: Diet Order            Diet regular Room service appropriate? Yes; Fluid consistency: Thin  Diet effective now               Code Status: Full code   Family Communication: None at bedside  Disposition Plan: Status is: Inpatient  Remains inpatient appropriate because:Inpatient level of care appropriate due to severity of illness getting worked up for nephrotic range proteinuria.  Dispo: The patient is from:  Home              Anticipated d/c is to: Home              Anticipated d/c date is: 2 days              Patient currently is not medically stable to d/c.   Barriers to  Discharge: GI bleeding with acute blood loss anemia-for EGD later today, improving hyponatremia.  Antimicrobial agents: Anti-infectives (From admission, onward)   Start     Dose/Rate Route Frequency Ordered Stop   08/19/19 1915  cefTRIAXone (ROCEPHIN) 1 g in sodium chloride 0.9 % 100 mL IVPB     1 g 200 mL/hr over 30 Minutes Intravenous Every 24 hours 08/19/19 1900 08/22/19 0109       Time spent: 25- minutes-Greater than 50% of this time was spent in counseling, explanation of diagnosis, planning of further management, and coordination of care.  MEDICATIONS: Scheduled Meds: . atorvastatin  5 mg Oral Daily  . feeding supplement  1 Container Oral TID BM  . folic acid  1 mg Oral Daily  . gabapentin  100 mg Oral TID  . LORazepam  0-4 mg Intravenous Q12H  . LORazepam  1-4 mg Oral Q6H   Or  . LORazepam  1-4 mg Intravenous Q6H  . multivitamin with minerals  1 tablet Oral Daily  . mupirocin cream   Topical BID  . nicotine  21 mg Transdermal Daily  . pantoprazole (PROTONIX) IV  40 mg Intravenous Q12H  . sodium bicarbonate  1,300 mg Oral BID  . thiamine  100 mg Oral Daily   Or  . thiamine  100 mg Intravenous Daily   Continuous Infusions:  PRN Meds:.acetaminophen **OR** [DISCONTINUED] acetaminophen, albuterol, [DISCONTINUED] ondansetron **OR** ondansetron (ZOFRAN) IV, oxyCODONE   PHYSICAL EXAM: Vital signs: Vitals:   08/22/19 0000 08/22/19 0400 08/22/19 0800 08/22/19 0809  BP: (!) 126/59 137/66 129/62   Pulse: 89 84 (!) 105 99  Resp: (!) 28 17 (!) 26 18  Temp:  98.7 F (37.1 C)    TempSrc:  Oral    SpO2: 97% 98% 94% 95%  Weight:      Height:       Filed Weights   08/20/19 0323 08/21/19 0342 08/21/19 1103  Weight: 62.1 kg 63 kg 59.9 kg   Body mass index is 20.67 kg/m.   Gen Exam:  Awake Alert, No new F.N deficits, Normal affect Cecilia.AT,PERRAL Supple Neck,No JVD, No cervical lymphadenopathy appriciated.  Symmetrical Chest wall movement, Good air movement bilaterally,  CTAB RRR,No Gallops, Rubs or new Murmurs, No Parasternal Heave +ve B.Sounds, Abd Soft, No tenderness, No organomegaly appriciated, No rebound - guarding or rigidity. No Cyanosis, Clubbing or edema, No new Rash or bruise   I have personally reviewed following labs and imaging studies  LABORATORY DATA: CBC: Recent Labs  Lab 08/19/19 1216 08/19/19 1216 08/19/19 1432 08/19/19 2058 08/20/19 0410 08/21/19 0911 08/22/19 0618  WBC 3.2*  --   --  2.6* 2.5* 3.7* 5.2  HGB 7.8*   < > 7.8* 7.5* 7.2* 7.0* 8.5*  HCT 22.1*   < > 23.0* 21.7* 20.9* 20.5* 24.8*  MCV 96.5  --   --  96.4 96.8 98.1 97.6  PLT 143*  --   --  152 131* 143* 143*   < > = values in this interval not displayed.    Basic Metabolic Panel: Recent Labs  Lab 08/19/19 2058 08/19/19 2316 08/20/19 0410 08/20/19 9983  08/20/19 1753 08/21/19 0911 08/21/19 1247 08/22/19 0618  NA 126*   < > 125* 128* 126* 128*  --  129*  K 3.5   < > 3.7 3.4* 4.0 3.6  --  3.6  CL 97*   < > 97* 97* 98 100  --  97*  CO2 16*   < > 17* 19* 18* 17*  --  17*  GLUCOSE 132*   < > 109* 105* 114* 102*  --  143*  BUN 17   < > 20 18 18 17   --  22*  CREATININE 2.47*   < > 2.36* 2.46* 2.56* 2.72*  --  2.65*  CALCIUM 8.7*   < > 8.4* 8.8* 8.3* 8.3*  --  8.4*  MG 1.7  --   --   --   --   --  1.4* 2.0  PHOS 3.5  --   --   --   --  2.9  --  2.9   < > = values in this interval not displayed.    GFR: Estimated Creatinine Clearance: 21.9 mL/min (A) (by C-G formula based on SCr of 2.65 mg/dL (H)).  Liver Function Tests: Recent Labs  Lab 08/19/19 1216 08/19/19 2058 08/21/19 0911 08/22/19 0618  AST 67* 50*  --   --   ALT 26 25  --   --   ALKPHOS 79 77  --   --   BILITOT 0.3 0.4  --   --   PROT 7.2 6.7  --   --   ALBUMIN 3.4* 3.1* 2.5* 2.6*   No results for input(s): LIPASE, AMYLASE in the last 168 hours. No results for input(s): AMMONIA in the last 168 hours.  Coagulation Profile: No results for input(s): INR, PROTIME in the last 168  hours.  Cardiac Enzymes: No results for input(s): CKTOTAL, CKMB, CKMBINDEX, TROPONINI in the last 168 hours.  BNP (last 3 results) No results for input(s): PROBNP in the last 8760 hours.  Lipid Profile: No results for input(s): CHOL, HDL, LDLCALC, TRIG, CHOLHDL, LDLDIRECT in the last 72 hours.  Thyroid Function Tests: Recent Labs    08/19/19 1543  TSH 1.951    Anemia Panel: Recent Labs    08/19/19 2058  VITAMINB12 388  FOLATE 9.9  FERRITIN 35  TIBC 396  IRON 32  RETICCTPCT 2.4    Urine analysis:    Component Value Date/Time   COLORURINE YELLOW 08/19/2019 1629   APPEARANCEUR HAZY (A) 08/19/2019 1629   LABSPEC 1.002 (L) 08/19/2019 1629   PHURINE 6.0 08/19/2019 1629   GLUCOSEU NEGATIVE 08/19/2019 1629   HGBUR SMALL (A) 08/19/2019 1629   BILIRUBINUR NEGATIVE 08/19/2019 1629   KETONESUR NEGATIVE 08/19/2019 1629   PROTEINUR 100 (A) 08/19/2019 1629   UROBILINOGEN 0.2 03/02/2010 2027   NITRITE POSITIVE (A) 08/19/2019 1629   LEUKOCYTESUR SMALL (A) 08/19/2019 1629    Sepsis Labs: Lactic Acid, Venous    Component Value Date/Time   LATICACIDVEN 1.1 01/12/2019 0214   LATICACIDVEN 1.3 01/12/2019 0214    MICROBIOLOGY: Recent Results (from the past 240 hour(s))  SARS Coronavirus 2 by RT PCR (hospital order, performed in [place] hospital lab) Nasopharyngeal Nasopharyngeal Swab     Status: None   Collection Time: 08/19/19  3:12 PM   Specimen: Nasopharyngeal Swab  Result Value Ref Range Status   SARS Coronavirus 2 NEGATIVE NEGATIVE Final    Comment: (NOTE) SARS-CoV-2 target nucleic acids are NOT DETECTED. The SARS-CoV-2 RNA is generally detectable in upper and  lower respiratory specimens during the acute phase of infection. The lowest concentration of SARS-CoV-2 viral copies this assay can detect is 250 copies / mL. A negative result does not preclude SARS-CoV-2 infection and should not be used as the sole basis for treatment or other patient management  decisions.  A negative result may occur with improper specimen collection / handling, submission of specimen other than nasopharyngeal swab, presence of viral mutation(s) within the areas targeted by this assay, and inadequate number of viral copies (<250 copies / mL). A negative result must be combined with clinical observations, patient history, and epidemiological information. Fact Sheet for Patients:   StrictlyIdeas.no Fact Sheet for Healthcare Providers: BankingDealers.co.za This test is not yet approved or cleared  by the [place] FDA and has been authorized for detection and/or diagnosis of SARS-CoV-2 by FDA under an Emergency Use Authorization (EUA).  This EUA will remain in effect (meaning this test can be used) for the duration of the COVID-19 declaration under Section 564(b)(1) of the Act, 21 U.S.C. section 360bbb-3(b)(1), unless the authorization is terminated or revoked sooner. Performed at [facility], [place] [address], [place], [place] [address]   Urine culture     Status: Abnormal   Collection Time: 08/19/19  4:30 PM   Specimen: Urine, Catheterized  Result Value Ref Range Status   Specimen Description URINE, CATHETERIZED  Final   Special Requests   Final    NONE Performed at [facility], [address] [place], [place] [address]    Culture >=100,000 COLONIES/mL ESCHERICHIA COLI (A)  Final   Report Status 08/21/2019 FINAL  Final   Organism ID, Bacteria ESCHERICHIA COLI (A)  Final      Susceptibility   Escherichia coli - MIC*    AMPICILLIN 8 SENSITIVE Sensitive     CEFAZOLIN <=4 SENSITIVE Sensitive     CEFTRIAXONE <=1 SENSITIVE Sensitive     CIPROFLOXACIN <=0.25 SENSITIVE Sensitive     GENTAMICIN <=1 SENSITIVE Sensitive     IMIPENEM <=0.25 SENSITIVE Sensitive     NITROFURANTOIN <=16 SENSITIVE Sensitive     TRIMETH/SULFA <=20 SENSITIVE Sensitive     AMPICILLIN/SULBACTAM 4 SENSITIVE Sensitive      PIP/TAZO <=4 SENSITIVE Sensitive     * >=100,000 COLONIES/mL ESCHERICHIA COLI    RADIOLOGY STUDIES/RESULTS: No results found.   LOS: 3 days   Signature  Lala Lund M.D on 08/22/2019 at 1:05 PM   -  To page go to [email or web address]

## 2019-08-22 NOTE — Progress Notes (Signed)
[facility] NEPHROLOGY PROGRESS NOTE  Assessment/ Plan: Pt is a 59 y.o. yo female with HTN, polynephritis, polysubstance abuse, hepatitis C infection not treated, GI bleed, CKD 4 with nephrotic syndrome follows with Dr. Hollie Salk, last creatinine level 2.19, Na 128 on 5/26 at [facility] admitted with leg weakness, pain, edema and UTI.  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  Spot UPC 2.4 in the hospital.  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  ANA is weakly positive 1: 40, not significant.  RA, C3, C4 negative. Hepatitis C RNA virus elevated.  Treatment of hep C per GI or ID, defer to primary team. US renal with small bilateral echogenic kidneys.  Probably no benefit of biopsy at this time.  Creatinine remains stable.  She is nonoliguric and no uremic feature.  #Hyponatremia, hypervolemic probably due to poor oral intake/beer portal mania: Both urine sodium and osmolality low. Received a dose of Lasix 80 mg on admission with improvement of sodium to 128. She has chronic hyponatremia. Encouraged to increase oral intake.  Euvolemic therefore no need for further diuretics.  #Hypokalemia: Repleted potassium chloride.  #Metabolic acidosis: Chronic issue, started sodium bicarbonate.  #E. coli UTI: On ceftriaxone.  #Acute blood loss anemia probably due to GI bleed: Status post upper GI endoscopy on 6/5 with normal finding.  #Chronic hepatitis C infection: Hep C viral load is elevated.  Recommend treatment.  GI is following.  # HTN/volume: Blood pressure acceptable.  Amlodipine on hold.  Subjective: Seen and examined at bedside.  No new event.  Not recording urine output.  Denies nausea vomiting chest pain shortness of breath. Objective Vital signs in last 24 hours: Vitals:   08/22/19 0000 08/22/19 0400 08/22/19 0800 08/22/19 0809  BP: (!) 126/59 137/66 129/62   Pulse: 89 84 (!) 105 99  Resp: (!) 28 17 (!) 26 18  Temp:  98.7 F (37.1 C)    TempSrc:  Oral     SpO2: 97% 98% 94% 95%  Weight:      Height:       Weight change: -3.125 kg  Intake/Output Summary (Last 24 hours) at 08/22/2019 1053 Last data filed at 08/22/2019 1000 Gross per 24 hour  Intake 1162.29 ml  Output --  Net 1162.29 ml       Labs: Basic Metabolic Panel: Recent Labs  Lab 08/19/19 2058 08/19/19 2316 08/20/19 1753 08/21/19 0911 08/22/19 0618  NA 126*   < > 126* 128* 129*  K 3.5   < > 4.0 3.6 3.6  CL 97*   < > 98 100 97*  CO2 16*   < > 18* 17* 17*  GLUCOSE 132*   < > 114* 102* 143*  BUN 17   < > 18 17 22*  CREATININE 2.47*   < > 2.56* 2.72* 2.65*  CALCIUM 8.7*   < > 8.3* 8.3* 8.4*  PHOS 3.5  --   --  2.9 2.9   < > = values in this interval not displayed.   Liver Function Tests: Recent Labs  Lab 08/19/19 1216 08/19/19 1216 08/19/19 2058 08/21/19 0911 08/22/19 0618  AST 67*  --  50*  --   --   ALT 26  --  25  --   --   ALKPHOS 79  --  77  --   --   BILITOT 0.3  --  0.4  --   --   PROT 7.2  --  6.7  --   --  ALBUMIN 3.4*   < > 3.1* 2.5* 2.6*   < > = values in this interval not displayed.   No results for input(s): LIPASE, AMYLASE in the last 168 hours. No results for input(s): AMMONIA in the last 168 hours. CBC: Recent Labs  Lab 08/19/19 1216 08/19/19 1432 08/19/19 2058 08/19/19 2058 08/20/19 0410 08/21/19 0911 08/22/19 0618  WBC 3.2*   < > 2.6*   < > 2.5* 3.7* 5.2  HGB 7.8*   < > 7.5*   < > 7.2* 7.0* 8.5*  HCT 22.1*   < > 21.7*   < > 20.9* 20.5* 24.8*  MCV 96.5  --  96.4  --  96.8 98.1 97.6  PLT 143*   < > 152   < > 131* 143* 143*   < > = values in this interval not displayed.   Cardiac Enzymes: No results for input(s): CKTOTAL, CKMB, CKMBINDEX, TROPONINI in the last 168 hours. CBG: No results for input(s): GLUCAP in the last 168 hours.  Iron Studies:  Recent Labs    08/19/19 2058  IRON 32  TIBC 396  FERRITIN 35   Studies/Results: No results found.  Medications: Infusions:   Scheduled Medications: . atorvastatin  5 mg  Oral Daily  . feeding supplement  1 Container Oral TID BM  . folic acid  1 mg Oral Daily  . gabapentin  100 mg Oral TID  . LORazepam  0-4 mg Intravenous Q12H  . LORazepam  1-4 mg Oral Q6H   Or  . LORazepam  1-4 mg Intravenous Q6H  . multivitamin with minerals  1 tablet Oral Daily  . mupirocin cream   Topical BID  . nicotine  21 mg Transdermal Daily  . pantoprazole (PROTONIX) IV  40 mg Intravenous Q12H  . sodium bicarbonate  1,300 mg Oral BID  . thiamine  100 mg Oral Daily   Or  . thiamine  100 mg Intravenous Daily    have reviewed scheduled and prn medications.  Physical Exam: General:NAD, comfortable Heart:RRR, s1s2 nl, no rubs Lungs: Clear b/l, no crackle Abdomen:soft, Non-tender, non-distended Extremities:No LE edema Neurology: Alert awake and following command.  No asterixis  [name] [name] 08/22/2019,10:53 AM  LOS: 3 days  Pager: [phone]

## 2019-08-22 NOTE — Progress Notes (Signed)
SATURATION QUALIFICATIONS: (This note is used to comply with regulatory documentation for home oxygen)  Patient Saturations on Room Air at Rest =96%  Patient Saturations on Room Air while Ambulating = 92-94%

## 2019-08-23 DIAGNOSIS — D509 Iron deficiency anemia, unspecified: Secondary | ICD-10-CM

## 2019-08-23 LAB — CBC WITH DIFFERENTIAL/PLATELET
Abs Immature Granulocytes: 0.03 10*3/uL (ref 0.00–0.07)
Basophils Absolute: 0 10*3/uL (ref 0.0–0.1)
Basophils Relative: 1 %
Eosinophils Absolute: 0.1 10*3/uL (ref 0.0–0.5)
Eosinophils Relative: 2 %
HCT: 22.7 % — ABNORMAL LOW (ref 36.0–46.0)
Hemoglobin: 7.8 g/dL — ABNORMAL LOW (ref 12.0–15.0)
Immature Granulocytes: 1 %
Lymphocytes Relative: 39 %
Lymphs Abs: 1.9 10*3/uL (ref 0.7–4.0)
MCH: 33.2 pg (ref 26.0–34.0)
MCHC: 34.4 g/dL (ref 30.0–36.0)
MCV: 96.6 fL (ref 80.0–100.0)
Monocytes Absolute: 0.9 10*3/uL (ref 0.1–1.0)
Monocytes Relative: 19 %
Neutro Abs: 1.9 10*3/uL (ref 1.7–7.7)
Neutrophils Relative %: 38 %
Platelets: 152 10*3/uL (ref 150–400)
RBC: 2.35 MIL/uL — ABNORMAL LOW (ref 3.87–5.11)
RDW: 14.5 % (ref 11.5–15.5)
WBC: 4.8 10*3/uL (ref 4.0–10.5)
nRBC: 0 % (ref 0.0–0.2)

## 2019-08-23 LAB — BRAIN NATRIURETIC PEPTIDE: B Natriuretic Peptide: 280.5 pg/mL — ABNORMAL HIGH (ref 0.0–100.0)

## 2019-08-23 LAB — COMPREHENSIVE METABOLIC PANEL
ALT: 21 U/L (ref 0–44)
AST: 46 U/L — ABNORMAL HIGH (ref 15–41)
Albumin: 2.3 g/dL — ABNORMAL LOW (ref 3.5–5.0)
Alkaline Phosphatase: 63 U/L (ref 38–126)
Anion gap: 11 (ref 5–15)
BUN: 25 mg/dL — ABNORMAL HIGH (ref 6–20)
CO2: 18 mmol/L — ABNORMAL LOW (ref 22–32)
Calcium: 8.1 mg/dL — ABNORMAL LOW (ref 8.9–10.3)
Chloride: 102 mmol/L (ref 98–111)
Creatinine, Ser: 2.85 mg/dL — ABNORMAL HIGH (ref 0.44–1.00)
GFR calc Af Amer: 20 mL/min — ABNORMAL LOW (ref >60)
GFR calc non Af Amer: 17 mL/min — ABNORMAL LOW (ref >60)
Glucose, Bld: 125 mg/dL — ABNORMAL HIGH (ref 70–99)
Potassium: 3.7 mmol/L (ref 3.5–5.1)
Sodium: 131 mmol/L — ABNORMAL LOW (ref 135–145)
Total Bilirubin: 0.4 mg/dL (ref 0.3–1.2)
Total Protein: 6.1 g/dL — ABNORMAL LOW (ref 6.5–8.1)

## 2019-08-23 LAB — MAGNESIUM: Magnesium: 1.7 mg/dL (ref 1.7–2.4)

## 2019-08-23 MED ORDER — FERROUS SULFATE 325 (65 FE) MG PO TABS: 325.0000 mg | ORAL_TABLET | Freq: Three times a day (TID) | ORAL | 0 refills | Status: DC

## 2019-08-23 MED ORDER — PANTOPRAZOLE SODIUM 40 MG PO TBEC: 40.0000 mg | DELAYED_RELEASE_TABLET | Freq: Two times a day (BID) | ORAL | 0 refills | Status: DC

## 2019-08-23 MED ORDER — FOLIC ACID 1 MG PO TABS: 1.0000 mg | ORAL_TABLET | Freq: Every day | ORAL | 0 refills | Status: DC

## 2019-08-23 MED ORDER — SODIUM BICARBONATE 650 MG PO TABS: 1300.0000 mg | ORAL_TABLET | Freq: Two times a day (BID) | ORAL | 0 refills | Status: DC

## 2019-08-23 MED ORDER — THIAMINE HCL 100 MG PO TABS: 100.0000 mg | ORAL_TABLET | Freq: Every day | ORAL | 0 refills | Status: DC

## 2019-08-23 MED ORDER — PANTOPRAZOLE SODIUM 40 MG PO TBEC
40.0000 mg | DELAYED_RELEASE_TABLET | Freq: Two times a day (BID) | ORAL | Status: DC
  Administered 2019-08-23: 40 mg via ORAL
  Filled 2019-08-23: qty 1

## 2019-08-23 MED ORDER — NICOTINE 21 MG/24HR TD PT24: 21.0000 mg | MEDICATED_PATCH | Freq: Every day | TRANSDERMAL | 0 refills | Status: DC

## 2019-08-23 MED FILL — ?PANTOPRAZOLE SO DR 40MG TA: 40 | 30 days supply | Qty: 60 | Fill #0
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
## 2019-08-23 NOTE — Progress Notes (Signed)
Patient escorted [facility].

## 2019-08-23 NOTE — Discharge Instructions (Signed)
Follow with Primary MD Ladell Pier, MD in 7 days   Get CBC, CMP  checked next visit within 1 week by Primary MD    Activity: As tolerated with Full fall precautions use walker/cane & assistance as needed  Disposition Home    Diet: Heart Healthy    Special Instructions: If you have smoked or chewed Tobacco  in the last 2 yrs please stop smoking, stop any regular Alcohol  and or any Recreational drug use.  On your next visit with your primary care physician please Get Medicines reviewed and adjusted.  Please request your Prim.MD to go over all Hospital Tests and Procedure/Radiological results at the follow up, please get all Hospital records sent to your Prim MD by signing hospital release before you go home.  If you experience worsening of your admission symptoms, develop shortness of breath, life threatening emergency, suicidal or homicidal thoughts you must seek medical attention immediately by calling 911 or calling your MD immediately  if symptoms less severe.  You Must read complete instructions/literature along with all the possible adverse reactions/side effects for all the Medicines you take and that have been prescribed to you. Take any new Medicines after you have completely understood and accpet all the possible adverse reactions/side effects.
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
## 2019-08-23 NOTE — Care Management Important Message (Signed)
Important Message  Patient Details  Name: [name] MRN: [id number] Date of Birth: 10-16-1960   Medicare Important Message Given:  Yes - Important Message mailed due to current National Emergency  Verbal consent obtained due to current National Emergency  Relationship to patient: Self Contact Name: Ashiya Kinkead Call Date: 08/23/19  Time: 1232 Phone: [phone] Outcome: Spoke with contact Important Message mailed to: Patient address on file    Delorse Lek 08/23/2019, 12:33 PM

## 2019-08-23 NOTE — Discharge Summary (Signed)
[name] [place] JXB:[id number] DOB: [birth year] DOA: 08/19/2019  PCP: Ladell Pier, MD  Admit date: 08/19/2019  Discharge date: 08/23/2019  Admitted From: Home   Disposition:  Home   Recommendations for Outpatient Follow-up:   Follow up with PCP in 1-2 weeks  PCP Please obtain BMP/CBC, 2 view CXR in 1week,  (see Discharge instructions)   PCP Please follow up on the following pending results:    Home Health: None   Equipment/Devices: None  Consultations: GI, Renal Discharge Condition: Stable    CODE STATUS: Full    Diet Recommendation: Heart Healthy   Diet Order            Diet regular Room service appropriate? Yes; Fluid consistency: Thin  Diet effective now               Chief Complaint  Patient presents with  . Leg Swelling  . Urinary Tract Infection     Brief history of present illness from the day of admission and additional interim summary    Patient is a 59 y.o. female with history of CKD stage IV, chronic hepatitis C, HTN, history of duodenal ulcer/GERD, COPD, polysubstance abuse (alcohol/cocaine/marijuana/tobacco) who presented with generalized weakness, worsening lower extremity swelling, and dark stools-found to have hyponatremia, worsening anemia with concern for upper GI bleeding-and subsequently admitted to the hospitalist service.  See below for further details.  Significant events: 6/3>> admit to [facility] for hyponatremia/upper GI bleeding with acute blood loss anemia.  Significant studies: 6/3: Chest x-ray>> no PNA 6/3: Renal ultrasound>>chronic renal medical disease, no mass or hydronephrosis. 6/4: Echo>> EF 13-08%, grade 2 diastolic dysfunction                                                                  Hospital Course    Hyponatremia: Secondary to hypervolemia likely  related to underlying nephrotic syndrome.  Improved after gentle diuresis, now euvolemic, case discussed with nephrologist Dr. Carolin Sicks, no further diuretics, discharge home with PCP and nephrology follow-up.  Probable upper GI bleeding with acute blood loss anemia: Continue PPI-but per GI-okay to change to twice daily dosing.  No further melanotic stool since admission-but around 2 g drop of hemoglobin from lab work done last week at nephrology office (Hb 9.4).  EGD showed gastritis and nonbleeding duodenal and gastric ulcers.  Clinically stable and she will be discharged on oral PPI along with iron supplementations with outpatient PCP and GI follow-up.  UTI/cystitis: Per nephrology-had pansensitive E. coli on urine culture last week at nephrology office-on Rocephin x3 days.  CKD stage IV baseline creatinine of 2.6: Per nephrology-almost had nephrotic range proteinuria and a lab work done last week at nephrology office.  Has history of HCV-and hence concern for glomerular pathology-however complement levels within normal limits.  HIV nonreactive.  A1c 5.6, ANA negative and nephrology following, will see her in the outpatient setting as well.  CKD induced mild metabolic acidosis has been placed on oral bicarb upon discharge.  Anasarca: Related to nephrotic syndrome-also has history of liver disease/hepatitis C-no overt stigmata of liver cirrhosis apparent.  Echo with preserved EF-but does have grade 2 diastolic dysfunction.  Significant improvement in edema after 1 dose of 80 mg of IV Lasix given in the ED yesterday.  Currently euvolemic no further diuresis needed per nephrology.  HTN: BP stable, DC Norvasc upon discharge as could be causing edema, blood pressure currently stable on no blood pressure medications.  Chronic low back pain/bilateral knee pain: Supportive care for now.  Polysubstance abuse (alcohol/cocaine/marijuana/tobacco use): no signs of alcohol withdrawal at this time-on Ativan  per protocol.  He has been extensively counseled, will also get outpatient assistance through case management prior to discharge.  Equivocal/borderline low a.m. cortisol level: Stable cortisol stim test.  Chronic hepatitis C: Per patient-she has never been treated-I am not sure if she is keen on pursuing treatment-but will need referral to the ID clinic on discharge.    Discharge diagnosis     Active Problems:   Hypertension   Chronic hepatitis C without hepatic coma (HCC)   Alcohol abuse   History of cocaine abuse (HCC)   CKD (chronic kidney disease) stage 4, GFR 15-29 ml/min (HCC)   Polysubstance abuse (HCC)   Hyponatremia   GI bleed    Discharge instructions    Discharge Instructions    Discharge instructions   Complete by: As directed    Follow with Primary MD Ladell Pier, MD in 7 days   Get CBC, CMP  checked next visit within 1 week by Primary MD    Activity: As tolerated with Full fall precautions use walker/cane & assistance as needed  Disposition Home    Diet: Heart Healthy    Special Instructions: If you have smoked or chewed Tobacco  in the last 2 yrs please stop smoking, stop any regular Alcohol  and or any Recreational drug use.  On your next visit with your primary care physician please Get Medicines reviewed and adjusted.  Please request your Prim.MD to go over all Hospital Tests and Procedure/Radiological results at the follow up, please get all Hospital records sent to your Prim MD by signing hospital release before you go home.  If you experience worsening of your admission symptoms, develop shortness of breath, life threatening emergency, suicidal or homicidal thoughts you must seek medical attention immediately by calling 911 or calling your MD immediately  if symptoms less severe.  You Must read complete instructions/literature along with all the possible adverse reactions/side effects for all the Medicines you take and that have been  prescribed to you. Take any new Medicines after you have completely understood and accpet all the possible adverse reactions/side effects.   Discharge wound care:   Complete by: As directed    Keep clean and dry at all times, apply bandage as you have in the daytime, you can leave it open at nighttime.   Increase activity slowly   Complete by: As directed       Discharge Medications   Allergies as of 08/23/2019      Reactions   Lisinopril Other (See Comments)   hyperkalemia      Medication List    STOP taking these medications   amLODipine 10 MG tablet Commonly known as: NORVASC   famotidine 20 MG tablet  Commonly known as: Pepcid     TAKE these medications   acetaminophen 500 MG tablet Commonly known as: TYLENOL Take 1,000 mg by mouth every 4 (four) hours as needed for mild pain.   albuterol 108 (90 Base) MCG/ACT inhaler Commonly known as: Ventolin HFA INHALE 2 PUFFS INTO THE LUNGS EVERY 6 HOURS AS NEEDED FOR WHEEZING OR SHORTNESS OF BREATH. What changed:   how much to take  how to take this  when to take this  reasons to take this   atorvastatin 10 MG tablet Commonly known as: LIPITOR Take 0.5 tablets (5 mg total) by mouth daily.   ferrous sulfate 325 (65 FE) MG tablet Take 1 tablet (325 mg total) by mouth 3 (three) times daily with meals. What changed: when to take this   folic acid 1 MG tablet Commonly known as: FOLVITE Take 1 tablet (1 mg total) by mouth daily. Start taking on: August 24, 2019   gabapentin 100 MG capsule Commonly known as: NEURONTIN Take 1 capsule (100 mg total) by mouth 3 (three) times daily.   multivitamin with minerals Tabs tablet Take 1 tablet by mouth daily.   nicotine 21 mg/24hr patch Commonly known as: NICODERM CQ - dosed in mg/24 hours Place 1 patch (21 mg total) onto the skin daily. Start taking on: August 24, 2019   pantoprazole 40 MG tablet Commonly known as: PROTONIX Take 1 tablet (40 mg total) by mouth 2 (two) times  daily.   sodium bicarbonate 650 MG tablet Take 2 tablets (1,300 mg total) by mouth 2 (two) times daily.   thiamine 100 MG tablet Take 1 tablet (100 mg total) by mouth daily. Start taking on: August 24, 2019            Discharge Care Instructions  (From admission, onward)         Start     Ordered   08/23/19 0000  Discharge wound care:    Comments: Keep clean and dry at all times, apply bandage as you have in the daytime, you can leave it open at nighttime.   08/23/19 1275          Follow-up Information    Ladell Pier, MD. Schedule an appointment as soon as possible for a visit in 1 week(s).   Specialty: Internal Medicine Contact information: [place] [address] [phone]        Milus Banister, MD. Schedule an appointment as soon as possible for a visit in 1 week(s).   Specialty: Gastroenterology Contact information: [address] [place] [address] [phone]        Rosita Fire, MD. Schedule an appointment as soon as possible for a visit in 1 week(s).   Specialties: Nephrology, Internal Medicine Contact information: [place] [address] [phone]           Major procedures and Radiology Reports - PLEASE review detailed and final reports thoroughly  -       US RENAL  Result Date: 08/19/2019 CLINICAL DATA:  Chronic kidney disease stage 4 EXAM: RENAL / URINARY TRACT ULTRASOUND COMPLETE COMPARISON:  None. FINDINGS: Right Kidney: Renal measurements: 7.0 x 3.8 x 3.9 cm = volume: 54 mL. Increased echotexture. No mass or hydronephrosis. Left Kidney: Renal measurements: 8.3 x 3.9 x 4.6 cm = volume: 79 mL. Increased echotexture. No mass or hydronephrosis. Bladder: Appears normal for degree of bladder distention. Other: None. IMPRESSION: Small, echogenic kidneys bilaterally compatible with chronic medical  renal disease. No acute findings. Electronically Signed   By: Rolm Baptise M.D.   On: 08/19/2019  18:00   DG Chest Portable 1 View  Result Date: 08/19/2019 CLINICAL DATA:  Shortness of breath. EXAM: PORTABLE CHEST 1 VIEW COMPARISON:  01/12/2019 FINDINGS: The heart size and mediastinal contours are within normal limits. Both lungs are clear. The visualized skeletal structures are unremarkable. IMPRESSION: Normal exam. Electronically Signed   By: Lorriane Shire M.D.   On: 08/19/2019 14:38   ECHOCARDIOGRAM COMPLETE  Result Date: 08/20/2019    ECHOCARDIOGRAM REPORT   Patient Name:   [name] Date of Exam: 08/20/2019 Medical Rec #:  [id number]      Height:       67.0 in Accession #:    [id number]     Weight:       136.9 lb Date of Birth:  [birth year]      BSA:          1.721 m Patient Age:    61 years       BP:           111/75 mmHg Patient Gender: F              HR:           85 bpm. Exam Location:  Inpatient Procedure: 2D Echo, Cardiac Doppler and Color Doppler Indications:    Dyspnea  History:        Patient has prior history of Echocardiogram examinations, most                 recent 06/08/2016. Risk Factors:Hypertension and Current Smoker.                 Polysubstance abuse. CKD.  Sonographer:    Clayton Lefort RDCS (AE) Referring Phys: [place]  1. Left ventricular ejection fraction, by estimation, is 60 to 65%. The left ventricle has normal function. The left ventricle has no regional wall motion abnormalities. Left ventricular diastolic parameters are consistent with Grade II diastolic dysfunction (pseudonormalization). Elevated left ventricular end-diastolic pressure.  2. Right ventricular systolic function is normal. The right ventricular size is normal. There is normal pulmonary artery systolic pressure.  3. Cannot r/o PFO.  4. The mitral valve is normal in structure. Trivial mitral valve regurgitation. No evidence of mitral stenosis.  5. The aortic valve is normal in structure. Aortic valve regurgitation is not visualized. Mild to moderate aortic valve sclerosis/calcification  is present, without any evidence of aortic stenosis.  6. The inferior vena cava is normal in size with greater than 50% respiratory variability, suggesting right atrial pressure of 3 mmHg. FINDINGS  Left Ventricle: Left ventricular ejection fraction, by estimation, is 60 to 65%. The left ventricle has normal function. The left ventricle has no regional wall motion abnormalities. The left ventricular internal cavity size was normal in size. There is  no left ventricular hypertrophy. Left ventricular diastolic parameters are consistent with Grade II diastolic dysfunction (pseudonormalization). Elevated left ventricular end-diastolic pressure. Right Ventricle: The right ventricular size is normal. No increase in right ventricular wall thickness. Right ventricular systolic function is normal. There is normal pulmonary artery systolic pressure. The tricuspid regurgitant velocity is 2.16 m/s, and  with an assumed right atrial pressure of 3 mmHg, the estimated right ventricular systolic pressure is 00.1 mmHg. Left Atrium: Left atrial size was normal in size. Right Atrium: Right atrial size was normal in size. Pericardium: There is no evidence of pericardial  effusion. Mitral Valve: The mitral valve is normal in structure. There is mild thickening of the mitral valve leaflet(s). There is mild calcification of the mitral valve leaflet(s). Normal mobility of the mitral valve leaflets. Trivial mitral valve regurgitation. No evidence of mitral valve stenosis. MV peak gradient, 10.6 mmHg. The mean mitral valve gradient is 4.0 mmHg. Tricuspid Valve: The tricuspid valve is normal in structure. Tricuspid valve regurgitation is not demonstrated. No evidence of tricuspid stenosis. Aortic Valve: The aortic valve is normal in structure. Aortic valve regurgitation is not visualized. Mild to moderate aortic valve sclerosis/calcification is present, without any evidence of aortic stenosis. Aortic valve mean gradient measures 7.0 mmHg.  Aortic valve peak gradient measures 14.0 mmHg. Aortic valve area, by VTI measures 1.74 cm. Pulmonic Valve: The pulmonic valve was normal in structure. Pulmonic valve regurgitation is not visualized. No evidence of pulmonic stenosis. Aorta: The aortic root is normal in size and structure. Venous: The inferior vena cava is normal in size with greater than 50% respiratory variability, suggesting right atrial pressure of 3 mmHg. IAS/Shunts: The interatrial septum was not well visualized.  LEFT VENTRICLE PLAX 2D LVIDd:         3.50 cm  Diastology LVIDs:         2.10 cm  LV e' lateral:   8.16 cm/s LV PW:         1.20 cm  LV E/e' lateral: 15.1 LV IVS:        1.00 cm  LV e' medial:    11.90 cm/s LVOT diam:     1.80 cm  LV E/e' medial:  10.3 LV SV:         53 LV SV Index:   31 LVOT Area:     2.54 cm  RIGHT VENTRICLE             IVC RV Basal diam:  1.90 cm     IVC diam: 1.80 cm RV S prime:     15.70 cm/s TAPSE (M-mode): 2.9 cm LEFT ATRIUM           Index       RIGHT ATRIUM          Index LA diam:      2.80 cm 1.63 cm/m  RA Area:     9.44 cm LA Vol (A4C): 57.9 ml 33.64 ml/m RA Volume:   17.40 ml 10.11 ml/m  AORTIC VALVE AV Area (Vmax):    1.80 cm AV Area (Vmean):   1.85 cm AV Area (VTI):     1.74 cm AV Vmax:           187.00 cm/s AV Vmean:          121.000 cm/s AV VTI:            0.305 m AV Peak Grad:      14.0 mmHg AV Mean Grad:      7.0 mmHg LVOT Vmax:         132.00 cm/s LVOT Vmean:        88.200 cm/s LVOT VTI:          0.208 m LVOT/AV VTI ratio: 0.68  AORTA Ao Root diam: 3.10 cm MITRAL VALVE                TRICUSPID VALVE MV Area (PHT): 3.27 cm     TR Peak grad:   18.7 mmHg MV Peak grad:  10.6 mmHg    TR Vmax:  216.00 cm/s MV Mean grad:  4.0 mmHg MV Vmax:       1.63 m/s     SHUNTS MV Vmean:      98.6 cm/s    Systemic VTI:  0.21 m MV Decel Time: 232 msec     Systemic Diam: 1.80 cm MV E velocity: 123.00 cm/s MV A velocity: 109.00 cm/s MV E/A ratio:  1.13 Jenkins Rouge MD Electronically signed by Jenkins Rouge  MD Signature Date/Time: 08/20/2019/9:53:20 AM    Final     Micro Results     Recent Results (from the past 240 hour(s))  SARS Coronavirus 2 by RT PCR (hospital order, performed in [facility] hospital lab) Nasopharyngeal Nasopharyngeal Swab     Status: None   Collection Time: 08/19/19  3:12 PM   Specimen: Nasopharyngeal Swab  Result Value Ref Range Status   SARS Coronavirus 2 NEGATIVE NEGATIVE Final    Comment: (NOTE) SARS-CoV-2 target nucleic acids are NOT DETECTED. The SARS-CoV-2 RNA is generally detectable in upper and lower respiratory specimens during the acute phase of infection. The lowest concentration of SARS-CoV-2 viral copies this assay can detect is 250 copies / mL. A negative result does not preclude SARS-CoV-2 infection and should not be used as the sole basis for treatment or other patient management decisions.  A negative result may occur with improper specimen collection / handling, submission of specimen other than nasopharyngeal swab, presence of viral mutation(s) within the areas targeted by this assay, and inadequate number of viral copies (<250 copies / mL). A negative result must be combined with clinical observations, patient history, and epidemiological information. Fact Sheet for Patients:   StrictlyIdeas.no Fact Sheet for Healthcare Providers: BankingDealers.co.za This test is not yet approved or cleared  by the [place] FDA and has been authorized for detection and/or diagnosis of SARS-CoV-2 by FDA under an Emergency Use Authorization (EUA).  This EUA will remain in effect (meaning this test can be used) for the duration of the COVID-19 declaration under Section 564(b)(1) of the Act, 21 U.S.C. section 360bbb-3(b)(1), unless the authorization is terminated or revoked sooner. Performed at [facility], [place] [address] [place], [place] [address]   Urine culture     Status: Abnormal   Collection  Time: 08/19/19  4:30 PM   Specimen: Urine, Catheterized  Result Value Ref Range Status   Specimen Description URINE, CATHETERIZED  Final   Special Requests   Final    NONE Performed at [facility], [address] [place], [place] [address]    Culture >=100,000 COLONIES/mL ESCHERICHIA COLI (A)  Final   Report Status 08/21/2019 FINAL  Final   Organism ID, Bacteria ESCHERICHIA COLI (A)  Final      Susceptibility   Escherichia coli - MIC*    AMPICILLIN 8 SENSITIVE Sensitive     CEFAZOLIN <=4 SENSITIVE Sensitive     CEFTRIAXONE <=1 SENSITIVE Sensitive     CIPROFLOXACIN <=0.25 SENSITIVE Sensitive     GENTAMICIN <=1 SENSITIVE Sensitive     IMIPENEM <=0.25 SENSITIVE Sensitive     NITROFURANTOIN <=16 SENSITIVE Sensitive     TRIMETH/SULFA <=20 SENSITIVE Sensitive     AMPICILLIN/SULBACTAM 4 SENSITIVE Sensitive     PIP/TAZO <=4 SENSITIVE Sensitive     * >=100,000 COLONIES/mL ESCHERICHIA COLI    Today   Subjective    [name] today has no headache,no chest abdominal pain,no new weakness tingling or numbness, feels much better wants to go home today.  Objective   Blood pressure (!) 118/59, pulse 93, temperature 98.2 F (36.8 C), temperature source Oral, resp. rate 19, height 5\' 7"  (1.702 m), weight 59.9 kg, SpO2 96 %.   Intake/Output Summary (Last 24 hours) at 08/23/2019 0956 Last data filed at 08/23/2019 0900 Gross per 24 hour  Intake 2100 ml  Output 3 ml  Net 2097 ml    Exam  Awake Alert, No new F.N deficits, Normal affect [place].AT,PERRAL Supple Neck,No JVD, No cervical lymphadenopathy appriciated.  Symmetrical Chest wall movement, Good air movement bilaterally, CTAB RRR,No Gallops,Rubs or new Murmurs, No Parasternal Heave +ve B.Sounds, Abd Soft, Non tender, No organomegaly appriciated, No rebound -guarding or rigidity. No Cyanosis, right wrist small old skin burn under bandage and appears stable   Data Review   CBC w Diff:  Lab Results  Component Value Date    WBC 4.8 08/23/2019   HGB 7.8 (L) 08/23/2019   HGB 10.5 (L) 07/16/2019   HCT 22.7 (L) 08/23/2019   HCT 30.0 (L) 07/16/2019   PLT 152 08/23/2019   PLT 257 07/16/2019   LYMPHOPCT 39 08/23/2019   MONOPCT 19 08/23/2019   EOSPCT 2 08/23/2019   BASOPCT 1 08/23/2019    CMP:  Lab Results  Component Value Date   NA 131 (L) 08/23/2019   NA 130 (L) 07/16/2019   K 3.7 08/23/2019   CL 102 08/23/2019   CO2 18 (L) 08/23/2019   BUN 25 (H) 08/23/2019   BUN 17 07/16/2019   CREATININE 2.85 (H) 08/23/2019   CREATININE 1.64 (H) 04/29/2016   PROT 6.1 (L) 08/23/2019   PROT 7.3 07/16/2019   ALBUMIN 2.3 (L) 08/23/2019   ALBUMIN 4.2 07/16/2019   BILITOT 0.4 08/23/2019   BILITOT 0.4 07/16/2019   ALKPHOS 63 08/23/2019   AST 46 (H) 08/23/2019   ALT 21 08/23/2019  .   Total Time in preparing paper work, data evaluation and todays exam - 65 minutes  Lala Lund M.D on 08/23/2019 at 9:56 AM  [facility]   Office  [phone]

## 2019-08-24 ENCOUNTER — Telehealth: Payer: Self-pay

## 2019-08-24 NOTE — Telephone Encounter (Signed)
Transition Care Management Follow-up Telephone Call Date of discharge and from where: 08/23/2019, [facility]   Call placed to patient # [phone], her boyfriend, [name] answered and said that she was not with him. He stated that he knows she was in the hospital and was released but he is not sure where she went.  He requested that this CM call this number back and leave a message with the call back number for her.  His phone numbers are the same as her contact number.   Call returned to patient and message left with call back requested to this CM.# [phone].  Patient has an appt with Dr Wynetta Emery 08/31/2019 @1110 

## 2019-08-25 ENCOUNTER — Telehealth: Payer: Self-pay

## 2019-08-25 NOTE — Telephone Encounter (Signed)
Transition Care Management Follow-up Telephone Call Attempt #2   Date of discharge and from where: 08/23/2019, [facility]   Call placed to patient # [phone], her boyfriend, [name] answered and he said that he has not heard from the patient yet.  He could not think of  another number to reach her. Reminded him that this CM left the contact information for [facility] on his voicemail yesterday.  Instructed him to have her call this CM if he hears from her.    Call placed to # [phone] x 2 and the message stated that the call cannot be completed at this time    Patient has an appt with Dr Wynetta Emery 08/31/2019 @1110 

## 2019-08-31 ENCOUNTER — Encounter: Payer: Self-pay | Admitting: Internal Medicine

## 2019-08-31 ENCOUNTER — Encounter: Payer: Self-pay | Admitting: Physician Assistant

## 2019-08-31 ENCOUNTER — Other Ambulatory Visit: Payer: Self-pay

## 2019-08-31 ENCOUNTER — Ambulatory Visit: Payer: Self-pay | Attending: Internal Medicine | Admitting: Internal Medicine

## 2019-08-31 VITALS — BP 130/75 | HR 82 | Temp 98.1°F | Resp 18 | Ht 67.0 in | Wt 141.0 lb

## 2019-08-31 DIAGNOSIS — K2100 Gastro-esophageal reflux disease with esophagitis, without bleeding: Secondary | ICD-10-CM | POA: Insufficient documentation

## 2019-08-31 DIAGNOSIS — N184 Chronic kidney disease, stage 4 (severe): Secondary | ICD-10-CM | POA: Insufficient documentation

## 2019-08-31 DIAGNOSIS — B182 Chronic viral hepatitis C: Secondary | ICD-10-CM | POA: Insufficient documentation

## 2019-08-31 DIAGNOSIS — J449 Chronic obstructive pulmonary disease, unspecified: Secondary | ICD-10-CM | POA: Insufficient documentation

## 2019-08-31 DIAGNOSIS — G8929 Other chronic pain: Secondary | ICD-10-CM | POA: Insufficient documentation

## 2019-08-31 DIAGNOSIS — I5032 Chronic diastolic (congestive) heart failure: Secondary | ICD-10-CM | POA: Insufficient documentation

## 2019-08-31 DIAGNOSIS — N049 Nephrotic syndrome with unspecified morphologic changes: Secondary | ICD-10-CM

## 2019-08-31 DIAGNOSIS — F419 Anxiety disorder, unspecified: Secondary | ICD-10-CM | POA: Insufficient documentation

## 2019-08-31 DIAGNOSIS — Z560 Unemployment, unspecified: Secondary | ICD-10-CM | POA: Insufficient documentation

## 2019-08-31 DIAGNOSIS — F329 Major depressive disorder, single episode, unspecified: Secondary | ICD-10-CM

## 2019-08-31 DIAGNOSIS — I129 Hypertensive chronic kidney disease with stage 1 through stage 4 chronic kidney disease, or unspecified chronic kidney disease: Secondary | ICD-10-CM | POA: Insufficient documentation

## 2019-08-31 DIAGNOSIS — K264 Chronic or unspecified duodenal ulcer with hemorrhage: Secondary | ICD-10-CM | POA: Insufficient documentation

## 2019-08-31 DIAGNOSIS — Z09 Encounter for follow-up examination after completed treatment for conditions other than malignant neoplasm: Secondary | ICD-10-CM

## 2019-08-31 DIAGNOSIS — Z87440 Personal history of urinary (tract) infections: Secondary | ICD-10-CM | POA: Insufficient documentation

## 2019-08-31 DIAGNOSIS — Z87442 Personal history of urinary calculi: Secondary | ICD-10-CM | POA: Insufficient documentation

## 2019-08-31 DIAGNOSIS — F1910 Other psychoactive substance abuse, uncomplicated: Secondary | ICD-10-CM

## 2019-08-31 DIAGNOSIS — Z8619 Personal history of other infectious and parasitic diseases: Secondary | ICD-10-CM | POA: Insufficient documentation

## 2019-08-31 DIAGNOSIS — Z888 Allergy status to other drugs, medicaments and biological substances status: Secondary | ICD-10-CM | POA: Insufficient documentation

## 2019-08-31 DIAGNOSIS — D500 Iron deficiency anemia secondary to blood loss (chronic): Secondary | ICD-10-CM | POA: Insufficient documentation

## 2019-08-31 DIAGNOSIS — E871 Hypo-osmolality and hyponatremia: Secondary | ICD-10-CM | POA: Insufficient documentation

## 2019-08-31 DIAGNOSIS — F32A Depression, unspecified: Secondary | ICD-10-CM

## 2019-08-31 DIAGNOSIS — F17200 Nicotine dependence, unspecified, uncomplicated: Secondary | ICD-10-CM | POA: Insufficient documentation

## 2019-08-31 DIAGNOSIS — K279 Peptic ulcer, site unspecified, unspecified as acute or chronic, without hemorrhage or perforation: Secondary | ICD-10-CM | POA: Insufficient documentation

## 2019-08-31 DIAGNOSIS — Z590 Homelessness: Secondary | ICD-10-CM | POA: Insufficient documentation

## 2019-08-31 DIAGNOSIS — Z79899 Other long term (current) drug therapy: Secondary | ICD-10-CM | POA: Insufficient documentation

## 2019-08-31 DIAGNOSIS — I5189 Other ill-defined heart diseases: Secondary | ICD-10-CM

## 2019-08-31 DIAGNOSIS — Z86010 Personal history of colonic polyps: Secondary | ICD-10-CM | POA: Insufficient documentation

## 2019-08-31 DIAGNOSIS — F17210 Nicotine dependence, cigarettes, uncomplicated: Secondary | ICD-10-CM | POA: Insufficient documentation

## 2019-08-31 DIAGNOSIS — F332 Major depressive disorder, recurrent severe without psychotic features: Secondary | ICD-10-CM | POA: Insufficient documentation

## 2019-08-31 MED ORDER — FUROSEMIDE 40 MG PO TABS: ORAL_TABLET | ORAL | 1 refills | Status: DC

## 2019-08-31 MED ORDER — SERTRALINE HCL 50 MG PO TABS: ORAL_TABLET | ORAL | 1 refills | Status: DC

## 2019-08-31 MED ORDER — SERTRALINE HCL 25 MG PO TABS: ORAL_TABLET | ORAL | 1 refills | Status: DC

## 2019-08-31 MED FILL — ?FUROSEMIDE 40 MG TABLET: 40 | 15 days supply | Qty: 15 | Fill #0

## 2019-08-31 MED FILL — ?SERTRALINE HCL 25MG TABS: 25 | 30 days supply | Qty: 60 | Fill #0

## 2019-08-31 NOTE — Patient Instructions (Signed)
I have sent 2 prescriptions to the pharmacy.  1 is Zoloft for depression.  The other one is the fluid pill called furosemide.  You will take the furosemide once a day for the next 2 days and then as needed for the swelling in the legs.  Please cut back on your salt intake.
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
## 2019-08-31 NOTE — Progress Notes (Signed)
Hospital F/u  

## 2019-08-31 NOTE — Addendum Note (Signed)
Addended by: Karle Plumber B on: 08/31/2019 05:02 PM   Modules accepted: Level of Service

## 2019-09-01 LAB — CBC
Hematocrit: 22.4 % — ABNORMAL LOW (ref 34.0–46.6)
Hemoglobin: 7.8 g/dL — ABNORMAL LOW (ref 11.1–15.9)
MCH: 32.2 pg (ref 26.6–33.0)
MCHC: 34.8 g/dL (ref 31.5–35.7)
MCV: 93 fL (ref 79–97)
Platelets: 283 10*3/uL (ref 150–450)
RBC: 2.42 x10E6/uL — CL (ref 3.77–5.28)
RDW: 13.8 % (ref 11.7–15.4)
WBC: 4.6 10*3/uL (ref 3.4–10.8)

## 2019-09-01 LAB — COMPREHENSIVE METABOLIC PANEL
ALT: 22 IU/L (ref 0–32)
AST: 41 IU/L — ABNORMAL HIGH (ref 0–40)
Albumin/Globulin Ratio: 1.1 — ABNORMAL LOW (ref 1.2–2.2)
Albumin: 3.8 g/dL (ref 3.8–4.9)
Alkaline Phosphatase: 101 IU/L (ref 48–121)
BUN/Creatinine Ratio: 13 (ref 9–23)
BUN: 23 mg/dL (ref 6–24)
Bilirubin Total: <0.2 mg/dL (ref 0.0–1.2)
CO2: 18 mmol/L — ABNORMAL LOW (ref 20–29)
Calcium: 9.3 mg/dL (ref 8.7–10.2)
Chloride: 99 mmol/L (ref 96–106)
Creatinine, Ser: 1.76 mg/dL — ABNORMAL HIGH (ref 0.57–1.00)
GFR calc Af Amer: 36 mL/min/{1.73_m2} — ABNORMAL LOW (ref >59)
GFR calc non Af Amer: 31 mL/min/{1.73_m2} — ABNORMAL LOW (ref >59)
Globulin, Total: 3.5 g/dL (ref 1.5–4.5)
Glucose: 98 mg/dL (ref 65–99)
Potassium: 4.9 mmol/L (ref 3.5–5.2)
Sodium: 132 mmol/L — ABNORMAL LOW (ref 134–144)
Total Protein: 7.3 g/dL (ref 6.0–8.5)

## 2019-09-01 LAB — HEPATITIS B SURFACE ANTIGEN: Hepatitis B Surface Ag: NEGATIVE

## 2019-09-01 LAB — PRO B NATRIURETIC PEPTIDE: NT-Pro BNP: 1799 pg/mL — ABNORMAL HIGH (ref 0–287)

## 2019-09-01 LAB — HEPATITIS B SURFACE ANTIBODY, QUANTITATIVE: Hepatitis B Surf Ab Quant: <3.1 m[IU]/mL — ABNORMAL LOW (ref >9.9)

## 2019-09-01 NOTE — Progress Notes (Signed)
Let patient know that she still has severe anemia.  However it is stable compared to when she left the hospital 10 days ago.  Take the iron tablet daily as prescribed.  Do not take any over-the-counter pain medications like ibuprofen, Aleve, Advil, Motrin due to the fact that she has stomach ulcer.  Kidney function a little better.  Hormone level called BNP suggests that she may have a heart failure component contributing to the lower extremity swelling.  She should take a fluid pill furosemide that I prescribed yesterday daily for 2 days in a row then take it at least 3-4 times a week.  She needs vaccine for hepatitis B.  We will plan to administer the first dose on her next visit.

## 2019-09-02 ENCOUNTER — Telehealth: Payer: Self-pay

## 2019-09-02 NOTE — Telephone Encounter (Signed)
Contacted pt to go over lab results pt didn't answer left message with [name] asking him to have pt give a call back for results.

## 2019-09-02 NOTE — Telephone Encounter (Signed)
Patient returned phone call and she states that she was informed by Dr.[name] to reach out in regards to her medicaid.

## 2019-09-08 ENCOUNTER — Telehealth: Payer: Self-pay

## 2019-09-08 NOTE — Telephone Encounter (Signed)
Call returned to the patient  # [phone] to discus her questions regarding medicaid. Message left with call back requested to this CM # [phone].

## 2019-09-10 ENCOUNTER — Telehealth: Payer: Self-pay | Admitting: Internal Medicine

## 2019-09-10 DIAGNOSIS — N049 Nephrotic syndrome with unspecified morphologic changes: Secondary | ICD-10-CM

## 2019-09-10 NOTE — Telephone Encounter (Signed)
Will forward to pcp

## 2019-09-10 NOTE — Telephone Encounter (Signed)
Patient called saying she has been taking her Lasix but her ankles and feet are still swollen and is in pain. Patient would like to know what to do. Please f/u

## 2019-09-11 MED ORDER — FUROSEMIDE 40 MG PO TABS: 40.0000 mg | ORAL_TABLET | Freq: Two times a day (BID) | ORAL | 0 refills | Status: DC

## 2019-09-17 NOTE — Telephone Encounter (Signed)
Contacted pt to go over Dr. Wynetta Emery response pt states she hasn't followed up with the kidney specialist.  Gave pt info to [facility] to follow. Pt doesn't have any insurance

## 2019-09-21 ENCOUNTER — Telehealth: Payer: Self-pay

## 2019-09-21 NOTE — Telephone Encounter (Signed)
Call placed to patient again  # [phone] to discuss her questions about medicaid. The response states that the call is not able to be completed at this time.

## 2019-09-29 ENCOUNTER — Telehealth: Payer: Self-pay | Admitting: Licensed Clinical Social Worker

## 2019-09-29 ENCOUNTER — Institutional Professional Consult (permissible substitution): Payer: Self-pay | Admitting: Licensed Clinical Social Worker

## 2019-09-29 NOTE — Telephone Encounter (Signed)
Call placed to patient regarding scheduled IBH appointment. Pt's daughter shared that pt is not with her and pt has run out of minutes on cell phone. LCSW left message with pt's daughter to encourage pt call [facility] to reschedule appointment.

## 2019-09-30 ENCOUNTER — Telehealth: Payer: Self-pay

## 2019-09-30 NOTE — Telephone Encounter (Signed)
Attempted again to contact patient regarding medicaid.  Calls placed to # [phone] and [phone], messages left on both numbers requesting a call back to this CM # [phone]

## 2019-10-06 ENCOUNTER — Ambulatory Visit: Payer: Self-pay | Admitting: Gastroenterology

## 2019-10-07 ENCOUNTER — Telehealth: Payer: Self-pay

## 2019-10-07 NOTE — Telephone Encounter (Signed)
Call placed to patient # [phone]. She explained that she frequently moves around and is not always at the same address/phone .  she has medicare part A and is not eligible for part B until next year when she is eligible for widow benefits.  She has applied for SSI and never heard back about the application.  She was in agreement to sending a referral to [employer] [facility] [place] for assistance. She currently has no income and does not have a medicaid application pending.   Referral sent  to attention of Charlisa Powell/[facility]

## 2019-10-07 NOTE — Telephone Encounter (Signed)
Pls give [name] another CB at [phone]. She states this is correct mobile and she will have it with her thru the rest of the day if [name] would kindly return the call.

## 2019-10-12 ENCOUNTER — Telehealth (INDEPENDENT_AMBULATORY_CARE_PROVIDER_SITE_OTHER): Payer: Self-pay | Admitting: Licensed Clinical Social Worker

## 2019-10-12 NOTE — Telephone Encounter (Signed)
Follow up call placed to patient. Patient reported that she was unable to make last IBH appointment due to having a death in the family. LCSW offered support and encouragement.   Pt reports that [employer] Aid was unable to provide assistance with appeal to SSI; however, referred her to Self-Help building, where patient has an upcoming appointment on 07/30 with a lawyer. Pt is appreciative for the support provided by Legal Aid. Strategies to process grief and promote well-being were discussed.   LCSW strongly encouraged patient to contact LCSW with any additional behavioral health and/or resource needs.

## 2019-10-21 ENCOUNTER — Other Ambulatory Visit: Payer: Self-pay | Admitting: Internal Medicine

## 2019-10-21 MED FILL — ?ATORVASTATIN 10 MG TABLET: 10 | 30 days supply | Qty: 15 | Fill #1

## 2019-10-21 MED FILL — ?PANTOPRAZOLE SODI DR 40MGT: 40 | 30 days supply | Qty: 60 | Fill #0

## 2019-10-21 MED FILL — !VENTOLIN HFA INHALER: 108 (90 BAS | 25 days supply | Qty: 18 | Fill #1

## 2019-10-21 MED FILL — FOLIC ACID 1 MG TABS: 1 | 30 days supply | Qty: 30 | Fill #0

## 2019-10-21 MED FILL — SERTRALINE HCL 25 MG TABLET: 25 | 30 days supply | Qty: 60 | Fill #1

## 2019-10-21 MED FILL — GABAPENTIN 100 MG CAPSULE: 100 | 30 days supply | Qty: 90 | Fill #1

## 2019-10-21 MED FILL — NICOTINE 21 MG/24HR PATCH: 21 | 28 days supply | Qty: 28 | Fill #0

## 2019-10-21 MED FILL — FAMOTIDINE 20 MG TABS: 20 | 30 days supply | Qty: 60 | Fill #1

## 2019-10-21 MED FILL — ?FUROSEMIDE 4OMG TABLETS: 40 | 30 days supply | Qty: 60 | Fill #0

## 2019-10-21 MED FILL — SODIUM BICARBONATE 650 MG T: 650 | 15 days supply | Qty: 60 | Fill #0

## 2019-11-08 ENCOUNTER — Telehealth: Payer: Self-pay | Admitting: Licensed Clinical Social Worker

## 2019-11-08 NOTE — Telephone Encounter (Signed)
Incoming call received from patient. Patient would like assistance with obtaining stimulus payments. LCSW provided Patient with supportive resources to assist. No additional concerns noted.

## 2019-11-23 ENCOUNTER — Other Ambulatory Visit: Payer: Self-pay | Admitting: Internal Medicine

## 2019-11-23 DIAGNOSIS — F32A Depression, unspecified: Secondary | ICD-10-CM

## 2019-11-23 DIAGNOSIS — N049 Nephrotic syndrome with unspecified morphologic changes: Secondary | ICD-10-CM

## 2019-11-23 DIAGNOSIS — F419 Anxiety disorder, unspecified: Secondary | ICD-10-CM

## 2019-11-23 MED FILL — GABAPENTIN 100 MG CAPSULE: 100 | 30 days supply | Qty: 90 | Fill #2

## 2019-11-23 MED FILL — SERTRALINE HCL 25 MG TABLET: 25 | 30 days supply | Qty: 60 | Fill #0

## 2019-11-23 MED FILL — ?FUROSEMIDE 4OMG TABLETS: 40 | 30 days supply | Qty: 60 | Fill #0

## 2019-11-23 MED FILL — ?ATORVASTATIN 10 MG TABLET: 10 | 30 days supply | Qty: 15 | Fill #2

## 2019-11-23 MED FILL — !VENTOLIN HFA INHALER: 108 (90 BAS | 25 days supply | Qty: 18 | Fill #2

## 2019-11-23 MED FILL — PANTOPRAZOLE SOD DR 40 MG T: 40 | 30 days supply | Qty: 60 | Fill #0

## 2019-11-25 ENCOUNTER — Telehealth: Payer: Self-pay | Admitting: Licensed Clinical Social Worker

## 2019-11-25 NOTE — Telephone Encounter (Signed)
Follow up call placed to patient. LCSW left message for a return call.

## 2019-12-01 ENCOUNTER — Encounter: Payer: Self-pay | Admitting: Internal Medicine

## 2019-12-01 NOTE — Progress Notes (Signed)
Patient seen 08/11/2019 Dr. Madelon Lips.  Diagnosis CKD stage IV secondary to hypertension, polysubstance abuse.  Unclear if hepatitis C playing a role too.  Anemia of renal disease.  Secondary hyperparathyroidism with parathyroid hormone level of 132.  Hyponatremia suspect element of there [place].  2 L fluid restriction recommended.  May need Lasix. BUN 24 creatinine 2.19, sodium of 128, hemoglobin 9.4, hematocrit 28.1, MCV 100

## 2019-12-13 MED FILL — PANTOPRAZOLE SOD DR 40 MG T: 40 | 30 days supply | Qty: 60 | Fill #0

## 2019-12-13 MED FILL — GABAPENTIN 100 MG CAPSULE: 100 | 30 days supply | Qty: 90 | Fill #2

## 2019-12-13 MED FILL — ?SERTRALINE HCL 25MG TABS: 25 | 30 days supply | Qty: 60 | Fill #0

## 2019-12-15 ENCOUNTER — Telehealth: Payer: Self-pay

## 2019-12-15 NOTE — Telephone Encounter (Signed)
As per [facility] [place], the patient was a no show for a meeting scheduled to review her SSA documents

## 2019-12-27 ENCOUNTER — Ambulatory Visit: Payer: Self-pay | Admitting: Internal Medicine

## 2019-12-31 ENCOUNTER — Emergency Department (HOSPITAL_COMMUNITY)
Admission: EM | Admit: 2019-12-31 | Discharge: 2020-01-01 | Disposition: A | Discharge status: Home / Self Care | Payer: Self-pay | Attending: Emergency Medicine | Admitting: Emergency Medicine

## 2019-12-31 ENCOUNTER — Ambulatory Visit (HOSPITAL_COMMUNITY)
Admission: RE | Admit: 2019-12-31 | Discharge: 2019-12-31 | Disposition: A | Discharge status: Home / Self Care | Payer: Self-pay | Attending: Psychiatry | Admitting: Psychiatry

## 2019-12-31 ENCOUNTER — Other Ambulatory Visit: Payer: Self-pay

## 2019-12-31 ENCOUNTER — Encounter (HOSPITAL_COMMUNITY): Payer: Self-pay

## 2019-12-31 DIAGNOSIS — F17210 Nicotine dependence, cigarettes, uncomplicated: Secondary | ICD-10-CM | POA: Insufficient documentation

## 2019-12-31 DIAGNOSIS — Z79899 Other long term (current) drug therapy: Secondary | ICD-10-CM | POA: Insufficient documentation

## 2019-12-31 DIAGNOSIS — R45851 Suicidal ideations: Secondary | ICD-10-CM | POA: Insufficient documentation

## 2019-12-31 DIAGNOSIS — F32A Depression, unspecified: Secondary | ICD-10-CM | POA: Insufficient documentation

## 2019-12-31 DIAGNOSIS — E876 Hypokalemia: Secondary | ICD-10-CM

## 2019-12-31 DIAGNOSIS — Z5902 Unsheltered homelessness: Secondary | ICD-10-CM | POA: Insufficient documentation

## 2019-12-31 DIAGNOSIS — N184 Chronic kidney disease, stage 4 (severe): Secondary | ICD-10-CM | POA: Insufficient documentation

## 2019-12-31 DIAGNOSIS — R079 Chest pain, unspecified: Secondary | ICD-10-CM | POA: Insufficient documentation

## 2019-12-31 DIAGNOSIS — N289 Disorder of kidney and ureter, unspecified: Secondary | ICD-10-CM

## 2019-12-31 DIAGNOSIS — F4310 Post-traumatic stress disorder, unspecified: Secondary | ICD-10-CM | POA: Insufficient documentation

## 2019-12-31 DIAGNOSIS — I129 Hypertensive chronic kidney disease with stage 1 through stage 4 chronic kidney disease, or unspecified chronic kidney disease: Secondary | ICD-10-CM | POA: Insufficient documentation

## 2019-12-31 DIAGNOSIS — F4320 Adjustment disorder, unspecified: Secondary | ICD-10-CM

## 2019-12-31 DIAGNOSIS — F1994 Other psychoactive substance use, unspecified with psychoactive substance-induced mood disorder: Secondary | ICD-10-CM

## 2019-12-31 DIAGNOSIS — F419 Anxiety disorder, unspecified: Secondary | ICD-10-CM | POA: Insufficient documentation

## 2019-12-31 DIAGNOSIS — F1924 Other psychoactive substance dependence with psychoactive substance-induced mood disorder: Secondary | ICD-10-CM | POA: Insufficient documentation

## 2019-12-31 DIAGNOSIS — F1910 Other psychoactive substance abuse, uncomplicated: Secondary | ICD-10-CM | POA: Insufficient documentation

## 2019-12-31 DIAGNOSIS — B1920 Unspecified viral hepatitis C without hepatic coma: Secondary | ICD-10-CM | POA: Insufficient documentation

## 2019-12-31 DIAGNOSIS — J45909 Unspecified asthma, uncomplicated: Secondary | ICD-10-CM | POA: Insufficient documentation

## 2019-12-31 DIAGNOSIS — J449 Chronic obstructive pulmonary disease, unspecified: Secondary | ICD-10-CM | POA: Insufficient documentation

## 2019-12-31 DIAGNOSIS — F1010 Alcohol abuse, uncomplicated: Secondary | ICD-10-CM | POA: Insufficient documentation

## 2019-12-31 DIAGNOSIS — Z20822 Contact with and (suspected) exposure to covid-19: Secondary | ICD-10-CM | POA: Insufficient documentation

## 2019-12-31 LAB — CBC
HCT: 27.2 % — ABNORMAL LOW (ref 36.0–46.0)
Hemoglobin: 9.6 g/dL — ABNORMAL LOW (ref 12.0–15.0)
MCH: 34.9 pg — ABNORMAL HIGH (ref 26.0–34.0)
MCHC: 35.3 g/dL (ref 30.0–36.0)
MCV: 98.9 fL (ref 80.0–100.0)
Platelets: 224 10*3/uL (ref 150–400)
RBC: 2.75 MIL/uL — ABNORMAL LOW (ref 3.87–5.11)
RDW: 15.5 % (ref 11.5–15.5)
WBC: 5.6 10*3/uL (ref 4.0–10.5)
nRBC: 0 % (ref 0.0–0.2)

## 2019-12-31 LAB — RAPID URINE DRUG SCREEN, HOSP PERFORMED
Amphetamines: NOT DETECTED
Barbiturates: NOT DETECTED
Benzodiazepines: NOT DETECTED
Cocaine: POSITIVE — AB
Opiates: NOT DETECTED
Tetrahydrocannabinol: NOT DETECTED

## 2019-12-31 LAB — COMPREHENSIVE METABOLIC PANEL
ALT: 53 U/L — ABNORMAL HIGH (ref 0–44)
AST: 40 U/L (ref 15–41)
Albumin: 3.3 g/dL — ABNORMAL LOW (ref 3.5–5.0)
Alkaline Phosphatase: 115 U/L (ref 38–126)
Anion gap: 15 (ref 5–15)
BUN: 27 mg/dL — ABNORMAL HIGH (ref 6–20)
CO2: 20 mmol/L — ABNORMAL LOW (ref 22–32)
Calcium: 8.9 mg/dL (ref 8.9–10.3)
Chloride: 104 mmol/L (ref 98–111)
Creatinine, Ser: 2.89 mg/dL — ABNORMAL HIGH (ref 0.44–1.00)
GFR, Estimated: 17 mL/min — ABNORMAL LOW (ref >60)
Glucose, Bld: 89 mg/dL (ref 70–99)
Potassium: 2.9 mmol/L — ABNORMAL LOW (ref 3.5–5.1)
Sodium: 139 mmol/L (ref 135–145)
Total Bilirubin: 0.8 mg/dL (ref 0.3–1.2)
Total Protein: 7.4 g/dL (ref 6.5–8.1)

## 2019-12-31 LAB — ETHANOL: Alcohol, Ethyl (B): <10 mg/dL (ref <10)

## 2019-12-31 LAB — SALICYLATE LEVEL: Salicylate Lvl: <7 mg/dL — ABNORMAL LOW (ref 7.0–30.0)

## 2019-12-31 LAB — ACETAMINOPHEN LEVEL: Acetaminophen (Tylenol), Serum: <10 ug/mL — ABNORMAL LOW (ref 10–30)

## 2019-12-31 MED ORDER — FOLIC ACID 1 MG PO TABS
1.0000 mg | ORAL_TABLET | Freq: Every day | ORAL | Status: DC
  Administered 2020-01-01: 1 mg via ORAL
  Filled 2019-12-31: qty 1

## 2019-12-31 MED ORDER — ACETAMINOPHEN 325 MG PO TABS
ORAL_TABLET | ORAL | Status: AC
  Administered 2019-12-31: 650 mg via ORAL
  Filled 2019-12-31: qty 2

## 2019-12-31 MED ORDER — SODIUM CHLORIDE 0.9 % IV BOLUS
1000.0000 mL | Freq: Once | INTRAVENOUS | Status: AC
  Administered 2019-12-31: 1000 mL via INTRAVENOUS

## 2019-12-31 MED ORDER — ACETAMINOPHEN 325 MG PO TABS: 650.0000 mg | ORAL_TABLET | Freq: Once | ORAL | Status: AC

## 2019-12-31 MED ORDER — ATORVASTATIN CALCIUM 10 MG PO TABS
5.0000 mg | ORAL_TABLET | Freq: Every day | ORAL | Status: DC
  Administered 2020-01-01: 5 mg via ORAL
  Filled 2019-12-31: qty 1

## 2019-12-31 MED ORDER — FERROUS SULFATE 325 (65 FE) MG PO TABS
325.0000 mg | ORAL_TABLET | Freq: Three times a day (TID) | ORAL | Status: DC
  Administered 2020-01-01 (×2): 325 mg via ORAL
  Filled 2019-12-31 (×2): qty 1

## 2019-12-31 MED ORDER — POTASSIUM CHLORIDE CRYS ER 20 MEQ PO TBCR
40.0000 meq | EXTENDED_RELEASE_TABLET | Freq: Once | ORAL | Status: AC
  Administered 2019-12-31: 40 meq via ORAL
  Filled 2019-12-31: qty 2

## 2019-12-31 MED ORDER — ALBUTEROL SULFATE HFA 108 (90 BASE) MCG/ACT IN AERS: 2.0000 | INHALATION_SPRAY | Freq: Four times a day (QID) | RESPIRATORY_TRACT | Status: DC | PRN

## 2019-12-31 NOTE — BH Assessment (Signed)
[facility] Assessment Progress Note Case was staffed with Leevy-[name] NP who recommended patient be sent to [facility] for medical clearance.

## 2019-12-31 NOTE — ED Notes (Signed)
Pt ambulatory from triage. EDP Messick at bedside.
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
## 2019-12-31 NOTE — ED Triage Notes (Signed)
Patient states she is homeless. Patient states she is very depressed. Patient reports that her physician has filled out paperwork for disability, but has not received any funds.  Patient states her son shot himself 2 years ago and is still feeling depressed. Patient states she has not had her depression medicine in 2 weeks.  Patient states she is alcoholic and smokes marijuana. Patient states she drank 1 beer at 1100 today. Patient states she last smoked marijuana at 1100 today as well.
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
## 2019-12-31 NOTE — H&P (Addendum)
Behavioral Health Medical Screening Exam  [name] is an 59 y.o. female who presents to [facility] for assessment of increased anxiety, depression, hopelessness, and passive suicidal ideations. Patient endorses increased stress in her life that has triggered her psychiatric symptoms including PTSD. Patient states her son died in [place] while serving in the [employer] 2019, has one daughter. Patient was recently living with man (romantically involved) who moved two other women in and put her out; as a result she has been homeless and sleeping on the streets. Patient states everything has been "going downhill since"; endorses increased drinking, anxiety, and hopelessness "I don't want to physically kill myself because I know my daughter needs me and I love my daughter. But I want peace and that seems like the only way sometimes. I just want things to get better but it just doesn't seem to go that way".   Patient has a reported psychiatry history of depression, anxiety, PTSD, and polysubstance abuse. Patient endorses drinking "3-4 beers everyday" last drink was "this morning". Pt denies any history of seizures or DTs related to withdrawal. Endorses history of Hepatitis C 2/2 IVDU; states last use "27 years ago, my ex husband died of an overdose". Smokes 1ppd cigarettes.   Patient is ill in appearance stating she has stage IVCKD and was supposed to start dialysis but "never followed up". She reports extensive medical history including: StageIVCKD, COPD, nephrolithiasis w/ stents, esophageal varices, ulcers, Hepatitis C (2/2 IVDU), and anemia requiring multiple blood transfusions.    Total Time spent with patient: 20 minutes  Psychiatric Specialty Exam: Physical Exam Constitutional:      Appearance: She is ill-appearing.  Skin:    General: Skin is dry.     Findings: Bruising present.  Neurological:     Mental Status: She is alert and oriented to person, place, and time.  Psychiatric:        Attention  and Perception: Attention and perception normal.        Mood and Affect: Affect normal. Mood is depressed.        Speech: Speech is tangential.        Behavior: Behavior is cooperative.        Thought Content: Thought content normal.        Cognition and Memory: Cognition and memory normal.        Judgment: Judgment normal.    Review of Systems  Respiratory: Positive for cough.   Cardiovascular: Positive for chest pain.  Endocrine: Positive for cold intolerance.  Skin: Positive for color change.  Hematological: Bruises/bleeds easily.  Psychiatric/Behavioral: Negative.   All other systems reviewed and are negative.  Blood pressure (!) (P) 169/79, temperature (P) 98.3 F (36.8 C), temperature source (P) Oral, resp. rate (P) 20.There is no height or weight on file to calculate BMI.   Heart sounds appear normal and lungs clear on auscultation with active cough. Patient endorses being fully vaccinated for COVID-19.   General Appearance: Disheveled and ill appearing; overall poor health maintenance; older than stated age Eye Contact:  Fair Speech:  Pressured Volume:  Normal Mood:  Depressed and Hopeless Affect:  Labile, Full Range and Tearful Thought Process:  Linear and Descriptions of Associations: Tangential Orientation:  Full (Time, Place, and Person) Thought Content:  Logical Suicidal Thoughts:  passive Homicidal Thoughts:  No Memory:  Immediate;   Fair Recent;   Fair Remote;   Fair Judgement:  Fair Insight:  Fair Psychomotor Activity:  Normal Concentration: Concentration: Fair and Attention Span:  Fair Recall:  [name]: Fair Akathisia:  No Handed:  Right AIMS (if indicated):    Assets:  Communication Skills Desire for Improvement Resilience Sleep:     Musculoskeletal: Strength & Muscle Tone: decreased and atrophy Gait & Station: normal Patient leans: Front and forward leaning posture  Blood pressure (!) (P) 169/79, temperature (P)  98.3 F (36.8 C), temperature source (P) Oral, resp. rate (P) 20.  Recommendations: Based on my evaluation the patient appears to have an emergency medical condition for which I recommend the patient be transferred to the emergency department for further evaluation. Patient has multiple high risk factors and poor health maintenance with complaints of some active chest pain with diffuse shortness of breath. Patient has reported medical history of Stage IVCKD with no recent follow-up, COPD, Hepatitis C, and multiple hospitalizations over the past two years that resulted in over 3 blood transfusions. Transportation called to transport patient to Emergency Department for medical clearance.   Inda Merlin, NP 12/31/2019, 5:39 PM

## 2020-01-01 DIAGNOSIS — F4320 Adjustment disorder, unspecified: Secondary | ICD-10-CM

## 2020-01-01 DIAGNOSIS — F1994 Other psychoactive substance use, unspecified with psychoactive substance-induced mood disorder: Secondary | ICD-10-CM

## 2020-01-01 LAB — RESPIRATORY PANEL BY RT PCR (FLU A&B, COVID)
Influenza A by PCR: NEGATIVE
Influenza B by PCR: NEGATIVE
SARS Coronavirus 2 by RT PCR: NEGATIVE

## 2020-01-01 LAB — BASIC METABOLIC PANEL
Anion gap: 8 (ref 5–15)
BUN: 27 mg/dL — ABNORMAL HIGH (ref 6–20)
CO2: 16 mmol/L — ABNORMAL LOW (ref 22–32)
Calcium: 8.2 mg/dL — ABNORMAL LOW (ref 8.9–10.3)
Chloride: 112 mmol/L — ABNORMAL HIGH (ref 98–111)
Creatinine, Ser: 2.62 mg/dL — ABNORMAL HIGH (ref 0.44–1.00)
GFR, Estimated: 19 mL/min — ABNORMAL LOW (ref >60)
Glucose, Bld: 98 mg/dL (ref 70–99)
Potassium: 3.5 mmol/L (ref 3.5–5.1)
Sodium: 136 mmol/L (ref 135–145)

## 2020-01-01 MED ORDER — SERTRALINE HCL 50 MG PO TABS
25.0000 mg | ORAL_TABLET | Freq: Every day | ORAL | Status: DC
  Administered 2020-01-01: 25 mg via ORAL
  Filled 2020-01-01: qty 1

## 2020-01-01 MED ORDER — GABAPENTIN 100 MG PO CAPS
200.0000 mg | ORAL_CAPSULE | Freq: Three times a day (TID) | ORAL | Status: DC
  Filled 2020-01-01: qty 2

## 2020-01-01 MED ORDER — SERTRALINE HCL 50 MG PO TABS
50.0000 mg | ORAL_TABLET | Freq: Every day | ORAL | Status: DC
Start: 2020-01-01 — End: 2020-01-01

## 2020-01-01 MED ORDER — GABAPENTIN 100 MG PO CAPS: 200.0000 mg | ORAL_CAPSULE | Freq: Two times a day (BID) | ORAL | Status: DC

## 2020-01-01 NOTE — Discharge Instructions (Signed)
Please reach out to [place] to apply for Medicaid as soon as possible. The [employer] may also be beneficial as they provide a wide variety of services.

## 2020-01-01 NOTE — ED Provider Notes (Signed)
Nursing notes and vitals signs, including pulse oximetry, reviewed.  Summary of this visit's results, reviewed by myself:  EKG:  EKG Interpretation  Date/Time:    Ventricular Rate:    PR Interval:    QRS Duration:   QT Interval:    QTC Calculation:   R Axis:     Text Interpretation:         Labs:  Results for orders placed or performed during the hospital encounter of 12/31/19 (from the past 24 hour(s))  Rapid urine drug screen (hospital performed)     Status: Abnormal   Collection Time: 12/31/19  6:18 PM  Result Value Ref Range   Opiates NONE DETECTED NONE DETECTED   Cocaine POSITIVE (A) NONE DETECTED   Benzodiazepines NONE DETECTED NONE DETECTED   Amphetamines NONE DETECTED NONE DETECTED   Tetrahydrocannabinol NONE DETECTED NONE DETECTED   Barbiturates NONE DETECTED NONE DETECTED  Comprehensive metabolic panel     Status: Abnormal   Collection Time: 12/31/19  6:58 PM  Result Value Ref Range   Sodium 139 135 - 145 mmol/L   Potassium 2.9 (L) 3.5 - 5.1 mmol/L   Chloride 104 98 - 111 mmol/L   CO2 20 (L) 22 - 32 mmol/L   Glucose, Bld 89 70 - 99 mg/dL   BUN 27 (H) 6 - 20 mg/dL   Creatinine, Ser 2.89 (H) 0.44 - 1.00 mg/dL   Calcium 8.9 8.9 - 10.3 mg/dL   Total Protein 7.4 6.5 - 8.1 g/dL   Albumin 3.3 (L) 3.5 - 5.0 g/dL   AST 40 15 - 41 U/L   ALT 53 (H) 0 - 44 U/L   Alkaline Phosphatase 115 38 - 126 U/L   Total Bilirubin 0.8 0.3 - 1.2 mg/dL   GFR, Estimated 17 (L) >60 mL/min   Anion gap 15 5 - 15  Ethanol     Status: None   Collection Time: 12/31/19  6:58 PM  Result Value Ref Range   Alcohol, Ethyl (B) <26 <83 mg/dL  Salicylate level     Status: Abnormal   Collection Time: 12/31/19  6:58 PM  Result Value Ref Range   Salicylate Lvl <4.1 (L) 7.0 - 30.0 mg/dL  Acetaminophen level     Status: Abnormal   Collection Time: 12/31/19  6:58 PM  Result Value Ref Range   Acetaminophen (Tylenol), Serum <10 (L) 10 - 30 ug/mL  cbc     Status: Abnormal   Collection Time:  12/31/19  6:58 PM  Result Value Ref Range   WBC 5.6 4.0 - 10.5 K/uL   RBC 2.75 (L) 3.87 - 5.11 MIL/uL   Hemoglobin 9.6 (L) 12.0 - 15.0 g/dL   HCT 27.2 (L) 36 - 46 %   MCV 98.9 80.0 - 100.0 fL   MCH 34.9 (H) 26.0 - 34.0 pg   MCHC 35.3 30.0 - 36.0 g/dL   RDW 15.5 11.5 - 15.5 %   Platelets 224 150 - 400 K/uL   nRBC 0.0 0.0 - 0.2 %  Basic metabolic panel     Status: Abnormal   Collection Time: 01/01/20  5:32 AM  Result Value Ref Range   Sodium 136 135 - 145 mmol/L   Potassium 3.5 3.5 - 5.1 mmol/L   Chloride 112 (H) 98 - 111 mmol/L   CO2 16 (L) 22 - 32 mmol/L   Glucose, Bld 98 70 - 99 mg/dL   BUN 27 (H) 6 - 20 mg/dL   Creatinine, Ser 2.62 (H) 0.44 - 1.00  mg/dL   Calcium 8.2 (L) 8.9 - 10.3 mg/dL   GFR, Estimated 19 (L) >60 mL/min   Anion gap 8 5 - 15   7:02 AM Potassium improved after repletion.   Tramel Westbrook, Jenny Reichmann, MD 01/01/20 [phone]

## 2020-01-01 NOTE — BHH Suicide Risk Assessment (Cosign Needed)
Suicide Risk Assessment  Discharge Assessment   [facility] Discharge Suicide Risk Assessment   Principal Problem: Substance induced mood disorder [facility]) Discharge Diagnoses: Principal Problem:   Substance induced mood disorder ([place])   Subjective:  Patient seen via telepsych by this provider; chart reviewed and consulted with Dr. Dwyane Dee on 01/01/20.  On evaluation [name] reports she is here because," I am depressed and do not have a place to stay." When asked about suicidal ideations she states, "I do not want to hurt myself or anyone else.  I just said that to get into the hospital."   She endorses a history for depression, exacerbated by her recent break up with her boyfriend that she has lived with for several years.  She states he abruptly put her out and she has not place to go. She has friends who brought her to the hospital but states she cannot stay with them.  She has 2 daughters who live locally but she cannot stay with them either, "I do not want to ask the for help."  States in lieu of her recent breakup, and medication non-compliance, she has been thinking more about her deceased son who committed suicide 2 years prior. She also states she takes zoloft for depression but has not had this in 2 weeks due to concerns with getting a follow-up visit with her PCP.  She states she feels better when taking her medication and feels the missed dosages and drug use have contributed to her current mood.  Again, she vehemently denies suicidal ideations, plan or intent.    Objective:  During evaluation [name] is sitting in the bed.  She is alert/oriented x 4; calm/cooperative; appears sad with congruent affect.  Patient is speaking in a clear tone at moderate volume, and normal pace; with good eye contact.  Her thought process is coherent and relevant; There is no indication that she is currently responding to internal/external stimuli or experiencing delusional thought content.  Patient denies  suicidal/self-harm/homicidal ideation, psychosis, and paranoia.  Patient has remained calm throughout assessment and has answered questions appropriately.    HPI Per EDP assessment January 08, 4439: 59 year old female with prior medical history as detailed below presents for evaluation.  Patient was seen at behavioral health this afternoon.  She is sent to the ED for medical screening.  Patient with symptoms of depression and passive suicidal ideation.  She is without significant acute medical complaint at this time.  Total Time spent with patient: 30 minutes  Musculoskeletal: Unable to assess via telepsych  Psychiatric Specialty Exam: @[facility] @  Blood pressure (!) 166/83, pulse 83, temperature 98.1 F (36.7 C), temperature source Oral, resp. rate 16, height 5\' 7"  (1.702 m), weight 59.9 kg, SpO2 98 %.Body mass index is 20.67 kg/m.  General Appearance: Fairly Groomed  Engineer, water::  Good  Speech:  Clear and Coherent and Normal Rate  Volume:  Normal  Mood:  Depressed  Affect:  Congruent  Thought Process:  Goal Directed and Descriptions of Associations: Intact  Orientation:  Full (Time, Place, and Person)  Thought Content:  Logical and Rumination ruminates on homeless concerns  Suicidal Thoughts:  No  Homicidal Thoughts:  No  Memory:  Immediate;   Good Recent;   Good Remote;   Good  Judgement:  Other:  Fair but appears to be baseline in the setting of alcohol and drug usage  Insight:  Fair, appears baseline in the setting of alcohol and drug usage  Psychomotor Activity:  Normal  Concentration:  Good  Recall:  Good  Fund of Knowledge:Good  Language: Good  Akathisia:  NA  Handed:  Right  AIMS (if indicated):     Assets:  Communication Skills Desire for Improvement  Sleep:     Cognition: WNL  ADL's:  Intact   Mental Status Per Nursing Assessment::   On Admission:     Demographic Factors:  Low socioeconomic status  Loss Factors: Loss of significant relationship and  Financial problems/change in socioeconomic status  Historical Factors: Family history of mental illness or substance abuse and Impulsivity  Risk Reduction Factors:   Sense of responsibility to family  Continued Clinical Symptoms:  Depression:   Comorbid alcohol abuse/dependence Alcohol/Substance Abuse/Dependencies  Cognitive Features That Contribute To Risk:  None    Suicide Risk:  Mild:  Suicidal ideation of limited frequency, intensity, duration, and specificity.  There are no identifiable plans, no associated intent, mild dysphoria and related symptoms, good self-control (both objective and subjective assessment), few other risk factors, and identifiable protective factors, including available and accessible social support.   No evidence of imminent risk to self or others at present.   Patient does not meet criteria for psychiatric inpatient admission. Supportive therapy provided about ongoing stressors.   Disposition: No evidence of imminent risk to self or others at present.   Patient does not meet criteria for psychiatric inpatient admission. Supportive therapy provided about ongoing stressors.   Plan Of Care/Follow-up recommendations:  Her creatinine was 2.62, she is being managed medically by EDP for renal disease.  Added the following psychotropic medication at lower dosages to account for chronic kidney concerns. Restarted zoloft 25mg  daily; initiated gabapentin 200mg  BID for alcohol use disorder.    Plan- As per above assessment, there are no current grounds for involuntary commitment at this time.?  The patient states she is most interested in finding housing resources.  She is not as enthusiastic about the substance abuse resources but accepts this for future reference.   SW to assist with homeless resources; peer support to offer community resources for substance use concerns.    Patient is not currently interested in inpatient services, but expresses agreement to  continue outpatient treatment - we have reviewed importance of substance abuse abstinence, potential negative impact substance abuse can have on his relationships and level of functioning, and importance of medication compliance. ?  Above plan was created with patient concordance.   Mallie Darting, NP 01/01/2020, 10:21 AM

## 2020-01-01 NOTE — Progress Notes (Addendum)
1:55pm: CSW attempted to reach staff at the [facility] for [facility] without success.  1:20pm: CSW spoke with Shirlean Mylar at [facility] who states there are no beds available at this time.  11am: CSW received consult for patient for homeless resources. Per [facility] handoff, this patient will be evaluated by TTS once she is medically cleared. CSW added homeless resources to the patient's AVS in addition to a reminder for her to apply for Medicaid at [place]. Patient was at [facility] four months ago and was given an abundant amount of resources to assist her.  CSW will follow for disposition needs.  Madilyn Fireman, MSW, LCSW-A Transitions of Care  Clinical Social Worker  [facility] Emergency Departments  Medical ICU [phone]

## 2020-01-01 NOTE — ED Notes (Signed)
Pt DC d off unit to home per provider. Pt alert, calm,. Cooperative, no s/s of distress. DC information given to and reviewed with pt, pt acknowledged understanding. Belongings given to pt. Pt ambulatory off unit, escorted by off duty [employer]. Pt given taxi voucher.

## 2020-01-26 MED FILL — AMLODIPINE BESYLATE 10 MG T: 10 | 30 days supply | Qty: 30 | Fill #0

## 2020-02-02 MED FILL — SODIUM BICARBONATE 650 MG T: 650 | 30 days supply | Qty: 60 | Fill #0

## 2020-02-23 ENCOUNTER — Encounter: Payer: Self-pay | Admitting: Internal Medicine

## 2020-02-23 NOTE — Progress Notes (Signed)
Saw Dr. Hollie Salk 01/26/2020. CKD stage IV.  Concern of likely progression to end-stage renal disease if BP not controlled well.  Started on sodium bicarb for metabolic acidosis. Anemia of renal disease.  Iron studies okay on iron supplement.  Encourage follow-up with GI and avoidance of NSAIDs. Secondary hyperparathyroidism Hypertensive renal disease.  Started on amlodipine 10 mg. CBC-H/H9/20 6.7. Iron 82/ferritin 83/iron percent 22%. Intact PTH 115 Urinalysis 1+ protein.  Protein creatinine ratio 3046 GFR 19.  Creatinine 2.64

## 2020-02-24 MED FILL — ?SERTRALINE HCL 25MG TABS: 25 | 30 days supply | Qty: 60 | Fill #0

## 2020-02-24 MED FILL — GABAPENTIN 100 MG CAPSULE: 100 | 30 days supply | Qty: 90 | Fill #2

## 2020-02-24 MED FILL — AMLODIPINE BESYLATE 10 MG T: 10 | 30 days supply | Qty: 30 | Fill #0

## 2020-05-02 ENCOUNTER — Encounter: Payer: Self-pay | Admitting: Gastroenterology

## 2020-06-29 ENCOUNTER — Ambulatory Visit: Payer: Self-pay | Attending: Internal Medicine | Admitting: Internal Medicine

## 2020-06-29 ENCOUNTER — Other Ambulatory Visit: Payer: Self-pay

## 2020-06-29 ENCOUNTER — Other Ambulatory Visit (HOSPITAL_COMMUNITY)
Admission: RE | Admit: 2020-06-29 | Discharge: 2020-06-29 | Disposition: A | Discharge status: Home / Self Care | Payer: Self-pay | Source: Ambulatory Visit | Attending: Internal Medicine | Admitting: Internal Medicine

## 2020-06-29 ENCOUNTER — Encounter: Payer: Self-pay | Admitting: Internal Medicine

## 2020-06-29 VITALS — BP 176/82 | HR 84 | Resp 17 | Ht 67.0 in | Wt 125.8 lb

## 2020-06-29 DIAGNOSIS — D509 Iron deficiency anemia, unspecified: Secondary | ICD-10-CM

## 2020-06-29 DIAGNOSIS — F17200 Nicotine dependence, unspecified, uncomplicated: Secondary | ICD-10-CM

## 2020-06-29 DIAGNOSIS — Z681 Body mass index (BMI) 19 or less, adult: Secondary | ICD-10-CM | POA: Insufficient documentation

## 2020-06-29 DIAGNOSIS — Z560 Unemployment, unspecified: Secondary | ICD-10-CM | POA: Insufficient documentation

## 2020-06-29 DIAGNOSIS — Z79899 Other long term (current) drug therapy: Secondary | ICD-10-CM | POA: Insufficient documentation

## 2020-06-29 DIAGNOSIS — I129 Hypertensive chronic kidney disease with stage 1 through stage 4 chronic kidney disease, or unspecified chronic kidney disease: Secondary | ICD-10-CM

## 2020-06-29 DIAGNOSIS — M79605 Pain in left leg: Secondary | ICD-10-CM

## 2020-06-29 DIAGNOSIS — Z597 Insufficient social insurance and welfare support: Secondary | ICD-10-CM | POA: Insufficient documentation

## 2020-06-29 DIAGNOSIS — N898 Other specified noninflammatory disorders of vagina: Secondary | ICD-10-CM | POA: Insufficient documentation

## 2020-06-29 DIAGNOSIS — M79604 Pain in right leg: Secondary | ICD-10-CM

## 2020-06-29 DIAGNOSIS — R634 Abnormal weight loss: Secondary | ICD-10-CM

## 2020-06-29 DIAGNOSIS — Z86010 Personal history of colonic polyps: Secondary | ICD-10-CM | POA: Insufficient documentation

## 2020-06-29 DIAGNOSIS — F1994 Other psychoactive substance use, unspecified with psychoactive substance-induced mood disorder: Secondary | ICD-10-CM | POA: Insufficient documentation

## 2020-06-29 DIAGNOSIS — Z0001 Encounter for general adult medical examination with abnormal findings: Secondary | ICD-10-CM | POA: Insufficient documentation

## 2020-06-29 DIAGNOSIS — Z5941 Food insecurity: Secondary | ICD-10-CM

## 2020-06-29 DIAGNOSIS — J449 Chronic obstructive pulmonary disease, unspecified: Secondary | ICD-10-CM | POA: Insufficient documentation

## 2020-06-29 DIAGNOSIS — Z8711 Personal history of peptic ulcer disease: Secondary | ICD-10-CM | POA: Insufficient documentation

## 2020-06-29 DIAGNOSIS — Z5900 Homelessness unspecified: Secondary | ICD-10-CM

## 2020-06-29 DIAGNOSIS — Z1159 Encounter for screening for other viral diseases: Secondary | ICD-10-CM

## 2020-06-29 DIAGNOSIS — F332 Major depressive disorder, recurrent severe without psychotic features: Secondary | ICD-10-CM | POA: Insufficient documentation

## 2020-06-29 DIAGNOSIS — F17210 Nicotine dependence, cigarettes, uncomplicated: Secondary | ICD-10-CM | POA: Insufficient documentation

## 2020-06-29 DIAGNOSIS — Z114 Encounter for screening for human immunodeficiency virus [HIV]: Secondary | ICD-10-CM

## 2020-06-29 DIAGNOSIS — G8929 Other chronic pain: Secondary | ICD-10-CM

## 2020-06-29 DIAGNOSIS — D500 Iron deficiency anemia secondary to blood loss (chronic): Secondary | ICD-10-CM

## 2020-06-29 DIAGNOSIS — E785 Hyperlipidemia, unspecified: Secondary | ICD-10-CM | POA: Insufficient documentation

## 2020-06-29 DIAGNOSIS — N184 Chronic kidney disease, stage 4 (severe): Secondary | ICD-10-CM

## 2020-06-29 DIAGNOSIS — F330 Major depressive disorder, recurrent, mild: Secondary | ICD-10-CM

## 2020-06-29 DIAGNOSIS — F1910 Other psychoactive substance abuse, uncomplicated: Secondary | ICD-10-CM

## 2020-06-29 DIAGNOSIS — R768 Other specified abnormal immunological findings in serum: Secondary | ICD-10-CM

## 2020-06-29 DIAGNOSIS — Z888 Allergy status to other drugs, medicaments and biological substances status: Secondary | ICD-10-CM | POA: Insufficient documentation

## 2020-06-29 DIAGNOSIS — Z1231 Encounter for screening mammogram for malignant neoplasm of breast: Secondary | ICD-10-CM

## 2020-06-29 MED ORDER — FAMOTIDINE 20 MG PO TABS
20.0000 mg | ORAL_TABLET | Freq: Two times a day (BID) | ORAL | 1 refills | Status: DC
Start: 2020-06-29 — End: 2020-12-26
  Filled 2020-06-29: qty 60, 30d supply, fill #0
  Filled 2020-08-03 – 2020-12-04 (×3): qty 60, 30d supply, fill #1

## 2020-06-29 MED ORDER — ADULT MULTIVITAMIN W/MINERALS CH: 1.0000 | ORAL_TABLET | Freq: Every day | ORAL | 1 refills | Status: DC

## 2020-06-29 MED ORDER — GABAPENTIN 100 MG PO CAPS
100.0000 mg | ORAL_CAPSULE | Freq: Three times a day (TID) | ORAL | 3 refills | Status: DC
  Filled 2020-06-29: qty 90, 30d supply, fill #0
  Filled 2020-08-03 – 2020-10-19 (×2): qty 90, 30d supply, fill #1
  Filled 2020-12-04: qty 90, 30d supply, fill #2

## 2020-06-29 MED ORDER — TRAZODONE HCL 50 MG PO TABS
50.0000 mg | ORAL_TABLET | Freq: Every evening | ORAL | 1 refills | Status: DC | PRN
  Filled 2020-06-29: qty 30, 30d supply, fill #0
  Filled 2020-08-03 – 2020-10-19 (×2): qty 30, 30d supply, fill #1

## 2020-06-29 MED ORDER — FERROUS SULFATE 325 (65 FE) MG PO TABS
325.0000 mg | ORAL_TABLET | Freq: Every day | ORAL | 0 refills | Status: DC
  Filled 2020-06-29: qty 30, 30d supply, fill #0
  Filled 2020-08-03 – 2020-10-19 (×2): qty 30, 30d supply, fill #1
  Filled 2021-02-26: qty 30, 30d supply, fill #2
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
## 2020-06-29 NOTE — Patient Instructions (Signed)
Please give form for orange card and get her to sign forms for mammogram.

## 2020-06-30 ENCOUNTER — Other Ambulatory Visit: Payer: Self-pay | Admitting: Internal Medicine

## 2020-06-30 ENCOUNTER — Telehealth: Payer: Self-pay | Admitting: Internal Medicine

## 2020-06-30 LAB — CERVICOVAGINAL ANCILLARY ONLY
Bacterial Vaginitis (gardnerella): POSITIVE — AB
Candida Glabrata: NEGATIVE
Candida Vaginitis: NEGATIVE
Chlamydia: POSITIVE — AB
Comment: NEGATIVE
Comment: NEGATIVE
Comment: NEGATIVE
Comment: NEGATIVE
Comment: NEGATIVE
Comment: NORMAL
Neisseria Gonorrhea: NEGATIVE
Trichomonas: POSITIVE — AB

## 2020-06-30 LAB — COMPREHENSIVE METABOLIC PANEL
ALT: 19 IU/L (ref 0–32)
AST: 35 IU/L (ref 0–40)
Albumin/Globulin Ratio: 1.5 (ref 1.2–2.2)
Albumin: 4.7 g/dL (ref 3.8–4.9)
Alkaline Phosphatase: 137 IU/L — ABNORMAL HIGH (ref 44–121)
BUN/Creatinine Ratio: 11 (ref 9–23)
BUN: 47 mg/dL — ABNORMAL HIGH (ref 6–24)
Bilirubin Total: 0.3 mg/dL (ref 0.0–1.2)
CO2: 11 mmol/L — ABNORMAL LOW (ref 20–29)
Calcium: 8.8 mg/dL (ref 8.7–10.2)
Chloride: 97 mmol/L (ref 96–106)
Creatinine, Ser: 4.32 mg/dL — ABNORMAL HIGH (ref 0.57–1.00)
Globulin, Total: 3.1 g/dL (ref 1.5–4.5)
Glucose: 78 mg/dL (ref 65–99)
Potassium: 3.9 mmol/L (ref 3.5–5.2)
Sodium: 134 mmol/L (ref 134–144)
Total Protein: 7.8 g/dL (ref 6.0–8.5)
eGFR: 11 mL/min/{1.73_m2} — ABNORMAL LOW (ref >59)

## 2020-06-30 LAB — CBC
Hematocrit: 24.1 % — ABNORMAL LOW (ref 34.0–46.6)
Hemoglobin: 8.2 g/dL — ABNORMAL LOW (ref 11.1–15.9)
MCH: 32.8 pg (ref 26.6–33.0)
MCHC: 34 g/dL (ref 31.5–35.7)
MCV: 96 fL (ref 79–97)
Platelets: 291 10*3/uL (ref 150–450)
RBC: 2.5 x10E6/uL — CL (ref 3.77–5.28)
RDW: 14.8 % (ref 11.7–15.4)
WBC: 5.8 10*3/uL (ref 3.4–10.8)

## 2020-06-30 LAB — MICROALBUMIN / CREATININE URINE RATIO
Creatinine, Urine: 58.6 mg/dL
Microalb/Creat Ratio: 954 mg/g creat — ABNORMAL HIGH (ref 0–29)
Microalbumin, Urine: 559.2 ug/mL

## 2020-06-30 LAB — LIPID PANEL
Chol/HDL Ratio: 3 ratio (ref 0.0–4.4)
Cholesterol, Total: 187 mg/dL (ref 100–199)
HDL: 62 mg/dL (ref >39)
LDL Chol Calc (NIH): 80 mg/dL (ref 0–99)
Triglycerides: 277 mg/dL — ABNORMAL HIGH (ref 0–149)
VLDL Cholesterol Cal: 45 mg/dL — ABNORMAL HIGH (ref 5–40)

## 2020-06-30 LAB — TSH+T4F+T3FREE
Free T4: 1.05 ng/dL (ref 0.82–1.77)
T3, Free: 1.9 pg/mL — ABNORMAL LOW (ref 2.0–4.4)
TSH: 2.31 u[IU]/mL (ref 0.450–4.500)

## 2020-06-30 LAB — HEPATITIS C ANTIBODY: Hep C Virus Ab: >11 s/co ratio — ABNORMAL HIGH (ref 0.0–0.9)

## 2020-06-30 LAB — HIV ANTIBODY (ROUTINE TESTING W REFLEX): HIV Screen 4th Generation wRfx: NONREACTIVE

## 2020-06-30 MED ORDER — METRONIDAZOLE 500 MG PO TABS
500.0000 mg | ORAL_TABLET | Freq: Two times a day (BID) | ORAL | 0 refills | Status: DC
  Filled 2020-06-30: qty 14, 7d supply, fill #0

## 2020-06-30 MED ORDER — AMLODIPINE BESYLATE 5 MG PO TABS
5.0000 mg | ORAL_TABLET | Freq: Every day | ORAL | 3 refills | Status: DC
  Filled 2020-06-30: qty 30, 30d supply, fill #0
  Filled 2020-08-03 – 2020-10-19 (×2): qty 30, 30d supply, fill #1
  Filled 2020-12-04: qty 30, 30d supply, fill #2

## 2020-06-30 MED ORDER — DOXYCYCLINE HYCLATE 100 MG PO TABS
100.0000 mg | ORAL_TABLET | Freq: Two times a day (BID) | ORAL | 0 refills | Status: DC
  Filled 2020-06-30: qty 14, 7d supply, fill #0

## 2020-06-30 MED ORDER — SODIUM BICARBONATE 650 MG PO TABS
650.0000 mg | ORAL_TABLET | Freq: Two times a day (BID) | ORAL | 3 refills | Status: DC
Start: 2020-06-30 — End: 2021-11-18
  Filled 2020-06-30: qty 60, 30d supply, fill #0
  Filled 2020-08-03: qty 60, 30d supply, fill #1

## 2020-06-30 NOTE — Telephone Encounter (Signed)
I tried to reach this patient twice today to go over lab results.  I was unsuccessful in reaching her on both mobile numbers listed.  I will have my medical assistant call her next week to go over lab results.

## 2020-06-30 NOTE — Progress Notes (Signed)
Let patient know that she is still anemic.  She should take the iron supplement daily as prescribed.  Prescription sent to the pharmacy on her recent visit.  Her kidney function is significantly worse compared to when last checked in October of last year.  She should get back in with her nephrologist Dr. Hollie Salk as soon as possible.  Please fax a copy of current lab results to Dr. Hollie Salk with [facility].  She should avoid taking any over-the-counter pain medications as these can make kidney function worse.  She should be on a medication called sodium bicarbonate for her kidneys.  I have sent a refill on it to our pharmacy for her to pick up.  Cholesterol levels look okay.  Screen for hepatitis C is positive.  We will need to do a confirmatory test.  Please return to the lab to have this done.  Screen for HIV is negative.  Thyroid level is normal.

## 2020-06-30 NOTE — Addendum Note (Signed)
Addended by: Karle Plumber B on: 06/30/2020 01:29 PM   Modules accepted: Orders

## 2020-06-30 NOTE — Progress Notes (Signed)
Let patient know that she tested positive for chlamydia and trichomonas.  Both of these are sexually transmitted infections.  Both she and her partner should be treated prior to having sex again without a condom.  I have sent prescriptions to our pharmacy for the antibiotic called doxycycline to treat the chlamydia and one call metronidazole to treat the trichomonas.

## 2020-07-03 ENCOUNTER — Other Ambulatory Visit: Payer: Self-pay

## 2020-07-04 ENCOUNTER — Other Ambulatory Visit: Payer: Self-pay

## 2020-07-04 ENCOUNTER — Ambulatory Visit: Payer: Self-pay | Admitting: *Deleted

## 2020-07-04 NOTE — Telephone Encounter (Signed)
Pt called in and was given the lab result message from Dr. Karle Plumber dated 06/30/2020 at 1:27 PM and 06/30/2020 at 2:10 PM.    She was surprised about the STI infections.  She verbalized understanding to come pick up her prescriptions from the pharmacy at [facility].  She was agreeable to coming by the lab and getting her blood work done to confirm the hepatitis C result since it was positive.    I reminded her to contact her kidney dr Dr. Hollie Salk and she said she would ASAP.  "I'll come on up there and get my prescriptions and get my blood drawn when I get off the phone with you".  Pt was tearful at the end of the conversation.  I gave her reassurance and encouragement.     Reason for Disposition . [1] Follow-up call to recent contact AND [2] information only call, no triage required    Lab result message given  Answer Assessment - Initial Assessment Questions 1. REASON FOR CALL or QUESTION: "What is your reason for calling today?" or "How can I best help you?" or "What question do you have that I can help answer?"     Lab results given to her.   See documentation section.  Protocols used: INFORMATION ONLY CALL - NO TRIAGE-A-AH

## 2020-07-04 NOTE — Addendum Note (Signed)
Addended bySteffanie Dunn on: 07/04/2020 09:07 AM   Modules accepted: Orders

## 2020-07-05 ENCOUNTER — Telehealth: Payer: Self-pay | Admitting: Internal Medicine

## 2020-07-05 NOTE — Telephone Encounter (Signed)
Pt states her kidney dr, Dr Hollie Salk, needs the results of her labs done 06/29/20. She has appt with her urologist, on 07/14/20 at 11 am.

## 2020-07-06 ENCOUNTER — Other Ambulatory Visit: Payer: Self-pay | Admitting: Internal Medicine

## 2020-07-06 ENCOUNTER — Telehealth: Payer: Self-pay

## 2020-07-06 DIAGNOSIS — B182 Chronic viral hepatitis C: Secondary | ICD-10-CM

## 2020-07-06 LAB — SPECIMEN STATUS REPORT

## 2020-07-06 LAB — HCV RNA (INTERNATIONAL UNITS)
HCV RNA (International Units): 13400000 IU/mL
HCV log10: 7.127 log10 IU/mL

## 2020-07-06 LAB — HCV RNA QUANT

## 2020-07-06 NOTE — Progress Notes (Signed)
Let patient know that she tested positive for hepatitis C.  Her partner should also be tested.  She should avoid sharing things like races, toothbrushes or anything that can potentially get her blood on it.  I will refer her to infectious disease to be considered for treatment.  If she has never received the vaccine for hepatitis a and B, she should start the vaccine series by coming as a nurse only visit to get the vaccine.

## 2020-07-06 NOTE — Telephone Encounter (Signed)
Reached out to the patient by phone. Patient was not able to take the call. CM did leave a message with friend for the patient to call return a phone call to CM on Monday. Will follow up with the patient next week.

## 2020-07-06 NOTE — Telephone Encounter (Signed)
Will fax labs in the morning to [facility] at [phone]

## 2020-07-07 ENCOUNTER — Telehealth: Payer: Self-pay

## 2020-07-07 NOTE — Telephone Encounter (Signed)
Labs has been faxed

## 2020-07-07 NOTE — Telephone Encounter (Signed)
Contacted pt to go over lab results pt didn't answer and was unable to lvm  

## 2020-07-25 ENCOUNTER — Telehealth: Payer: Self-pay

## 2020-07-25 NOTE — Telephone Encounter (Signed)
Reached out to the patient to share community resources. LVM for the patient to return a phone call to [facility].

## 2020-08-02 ENCOUNTER — Other Ambulatory Visit: Payer: Self-pay

## 2020-08-02 ENCOUNTER — Ambulatory Visit: Payer: Self-pay | Admitting: *Deleted

## 2020-08-02 ENCOUNTER — Encounter (HOSPITAL_COMMUNITY): Payer: Self-pay | Admitting: *Deleted

## 2020-08-02 ENCOUNTER — Emergency Department (HOSPITAL_COMMUNITY)
Admission: EM | Admit: 2020-08-02 | Discharge: 2020-08-02 | Disposition: A | Discharge status: Home / Self Care | Payer: Self-pay | Attending: Emergency Medicine | Admitting: Emergency Medicine

## 2020-08-02 DIAGNOSIS — U071 COVID-19: Secondary | ICD-10-CM | POA: Insufficient documentation

## 2020-08-02 DIAGNOSIS — I129 Hypertensive chronic kidney disease with stage 1 through stage 4 chronic kidney disease, or unspecified chronic kidney disease: Secondary | ICD-10-CM | POA: Insufficient documentation

## 2020-08-02 DIAGNOSIS — R1032 Left lower quadrant pain: Secondary | ICD-10-CM | POA: Insufficient documentation

## 2020-08-02 DIAGNOSIS — J45909 Unspecified asthma, uncomplicated: Secondary | ICD-10-CM | POA: Insufficient documentation

## 2020-08-02 DIAGNOSIS — N184 Chronic kidney disease, stage 4 (severe): Secondary | ICD-10-CM | POA: Insufficient documentation

## 2020-08-02 DIAGNOSIS — Z79899 Other long term (current) drug therapy: Secondary | ICD-10-CM | POA: Insufficient documentation

## 2020-08-02 DIAGNOSIS — Z859 Personal history of malignant neoplasm, unspecified: Secondary | ICD-10-CM | POA: Insufficient documentation

## 2020-08-02 DIAGNOSIS — F17210 Nicotine dependence, cigarettes, uncomplicated: Secondary | ICD-10-CM | POA: Insufficient documentation

## 2020-08-02 LAB — CBC WITH DIFFERENTIAL/PLATELET
Abs Immature Granulocytes: 0.02 10*3/uL (ref 0.00–0.07)
Basophils Absolute: 0 10*3/uL (ref 0.0–0.1)
Basophils Relative: 1 %
Eosinophils Absolute: 0 10*3/uL (ref 0.0–0.5)
Eosinophils Relative: 0 %
HCT: 25.5 % — ABNORMAL LOW (ref 36.0–46.0)
Hemoglobin: 8.4 g/dL — ABNORMAL LOW (ref 12.0–15.0)
Immature Granulocytes: 0 %
Lymphocytes Relative: 32 %
Lymphs Abs: 1.7 10*3/uL (ref 0.7–4.0)
MCH: 33.3 pg (ref 26.0–34.0)
MCHC: 32.9 g/dL (ref 30.0–36.0)
MCV: 101.2 fL — ABNORMAL HIGH (ref 80.0–100.0)
Monocytes Absolute: 0.7 10*3/uL (ref 0.1–1.0)
Monocytes Relative: 14 %
Neutro Abs: 2.8 10*3/uL (ref 1.7–7.7)
Neutrophils Relative %: 53 %
Platelets: 263 10*3/uL (ref 150–400)
RBC: 2.52 MIL/uL — ABNORMAL LOW (ref 3.87–5.11)
RDW: 15.6 % — ABNORMAL HIGH (ref 11.5–15.5)
WBC: 5.3 10*3/uL (ref 4.0–10.5)
nRBC: 0 % (ref 0.0–0.2)

## 2020-08-02 LAB — COMPREHENSIVE METABOLIC PANEL
ALT: 25 U/L (ref 0–44)
AST: 41 U/L (ref 15–41)
Albumin: 3.4 g/dL — ABNORMAL LOW (ref 3.5–5.0)
Alkaline Phosphatase: 102 U/L (ref 38–126)
Anion gap: 14 (ref 5–15)
BUN: 31 mg/dL — ABNORMAL HIGH (ref 6–20)
CO2: 14 mmol/L — ABNORMAL LOW (ref 22–32)
Calcium: 8.5 mg/dL — ABNORMAL LOW (ref 8.9–10.3)
Chloride: 102 mmol/L (ref 98–111)
Creatinine, Ser: 3.34 mg/dL — ABNORMAL HIGH (ref 0.44–1.00)
GFR, Estimated: 15 mL/min — ABNORMAL LOW (ref >60)
Glucose, Bld: 106 mg/dL — ABNORMAL HIGH (ref 70–99)
Potassium: 3.8 mmol/L (ref 3.5–5.1)
Sodium: 130 mmol/L — ABNORMAL LOW (ref 135–145)
Total Bilirubin: 0.4 mg/dL (ref 0.3–1.2)
Total Protein: 7.2 g/dL (ref 6.5–8.1)

## 2020-08-02 LAB — RESP PANEL BY RT-PCR (FLU A&B, COVID) ARPGX2
Influenza A by PCR: NEGATIVE
Influenza B by PCR: NEGATIVE
SARS Coronavirus 2 by RT PCR: POSITIVE — AB

## 2020-08-02 LAB — LIPASE, BLOOD: Lipase: 79 U/L — ABNORMAL HIGH (ref 11–51)

## 2020-08-02 MED ORDER — AMOXICILLIN-POT CLAVULANATE 875-125 MG PO TABS: 1.0000 | ORAL_TABLET | Freq: Two times a day (BID) | ORAL | 0 refills | Status: DC

## 2020-08-02 MED ORDER — ACETAMINOPHEN 325 MG PO TABS
650.0000 mg | ORAL_TABLET | Freq: Once | ORAL | Status: AC
  Administered 2020-08-02: 650 mg via ORAL
  Filled 2020-08-02: qty 2
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
## 2020-08-02 NOTE — Telephone Encounter (Signed)
C/o worsening "sinus infection" symptoms. Shortness of breath with exertion and some at rest x 3 days ago. C/o sore throat, runny nose and blowing nose with green mucus noted. C/o "scratchy" cough and body aches, chills. Patient reports she has not tested for covid and has to catch a bus for transportation. Patient is requesting medication for symptoms and is using her inhaler for the shortness of breath. Patient reports she has had 2 vaccinations of [place] but no booster. Hx asthma and CKD. Encouraged patient to wear a mask out in public if she goes out and to get tested for covid . Patient reports she might just go to ED if PCP can not prescribe any medications for her symptoms. Patient reports she does not have access for virtual visit and must take bus for transportation to mobile unit or office. reinforced patient could not come to office with her symptoms at this time.Care advise given. Patient verbalized understanding of care advise and to call back or go to [facility] or ED if symptoms worsen.   Reason for Disposition . [1] MILD difficulty breathing (e.g., minimal/no SOB at rest, SOB with walking, pulse <100) AND [2] NEW-onset or WORSE than normal  Answer Assessment - Initial Assessment Questions 1. RESPIRATORY STATUS: "Describe your breathing?" (e.g., wheezing, shortness of breath, unable to speak, severe coughing)      Shortness of breath with exertion 2. ONSET: "When did this breathing problem begin?"      3 days  3. PATTERN "Does the difficult breathing come and go, or has it been constant since it started?"      Comes and goes  4. SEVERITY: "How bad is your breathing?" (e.g., mild, moderate, severe)    - MILD: No SOB at rest, mild SOB with walking, speaks normally in sentences, can lie down, no retractions, pulse < 100.    - MODERATE: SOB at rest, SOB with minimal exertion and prefers to sit, cannot lie down flat, speaks in phrases, mild retractions, audible wheezing, pulse 100-120.    - SEVERE:  Very SOB at rest, speaks in single words, struggling to breathe, sitting hunched forward, retractions, pulse > 120       Mild 5. RECURRENT SYMPTOM: "Have you had difficulty breathing before?" If Yes, ask: "When was the last time?" and "What happened that time?"      Sometimes  6. CARDIAC HISTORY: "Do you have any history of heart disease?" (e.g., heart attack, angina, bypass surgery, angioplasty)      HTN  7. LUNG HISTORY: "Do you have any history of lung disease?"  (e.g., pulmonary embolus, asthma, emphysema)     Asthma , borderline COPD 8. CAUSE: "What do you think is causing the breathing problem?"      Sinus infection 9. OTHER SYMPTOMS: "Do you have any other symptoms? (e.g., dizziness, runny nose, cough, chest pain, fever)     Runny nose, cough, chills, body aches , sore throat ear pain 10. O2 SATURATION MONITOR:  "Do you use an oxygen saturation monitor (pulse oximeter) at home?" If Yes, "What is your reading (oxygen level) today?" "What is your usual oxygen saturation reading?" (e.g., 95%)       na 11. PREGNANCY: "Is there any chance you are pregnant?" "When was your last menstrual period?"       na 12. TRAVEL: "Have you traveled out of the country in the last month?" (e.g., travel history, exposures)       na  Protocols used: BREATHING DIFFICULTY-A-AH

## 2020-08-02 NOTE — Discharge Instructions (Addendum)
If you had any testing done today, the results will show up on your MyChart phone app in 24 hours.  Call your primary care doctor in the next 1-2 days to arrange video follow-up.   Use a finger pulse oximeter at home.  You may purchase one at [facility] or [facility] or online.  If the numbers drops and stays below 90%, return immediately back to the ER.  Otherwise increase your fluid intake, isolate at home for 5 days after symptoms resolve, and inform recent close contacts of the need to test for Covid.  

## 2020-08-02 NOTE — ED Provider Notes (Signed)
Emergency Medicine Provider Triage Evaluation Note  [name] , a 60 y.o. female  was evaluated in triage.  Pt complains of reports left side abdominal pain, fatigue, generalized weakness, onset a week ago. CKD 4. No known fevers but feels hot today. Decreased PO intake. No new cough.  Review of Systems  Positive: Sinus drainage, left side abdominal pain,  Negative: Dysuria, diarrhea   Physical Exam  BP (!) 154/87 (BP Location: Left Arm)   Pulse (!) 105   Temp 99.5 F (37.5 C) (Oral)   Resp 20   SpO2 100%  Gen:   Awake, no distress   Resp:  Normal effort  MSK:   Moves extremities without difficulty  Other:  Left side abdominal tenderness  Medical Decision Making  Medically screening exam initiated at 4:05 PM.  Appropriate orders placed.  [name] was informed that the remainder of the evaluation will be completed by another provider, this initial triage assessment does not replace that evaluation, and the importance of remaining in the ED until their evaluation is complete.     Tacy Learn, PA-C 08/02/20 1608    Quintella Reichert, MD 08/03/20 [phone]

## 2020-08-02 NOTE — ED Notes (Signed)
Reviewed discharge instructions with patient. Follow-up care and medications reviewed. Patient  verbalized understanding. Patient A&Ox4, VSS, and ambulatory with steady gait upon discharge.  °

## 2020-08-02 NOTE — ED Triage Notes (Signed)
Pt has multiple complaints. Reports having left side abd pain and bilateral back pain. Denies n/v/d. Has head congestion and headache.

## 2020-08-03 ENCOUNTER — Other Ambulatory Visit: Payer: Self-pay

## 2020-08-04 NOTE — Telephone Encounter (Signed)
Contacted pt to go over provider response pt didn't answer left a detailed vm making her aware and if she has any questions or concerns to give [place] a call

## 2020-08-10 ENCOUNTER — Other Ambulatory Visit: Payer: Self-pay

## 2020-08-21 ENCOUNTER — Encounter: Payer: Self-pay | Admitting: Gastroenterology

## 2020-10-09 ENCOUNTER — Ambulatory Visit: Payer: Self-pay

## 2020-10-09 ENCOUNTER — Telehealth: Payer: Self-pay

## 2020-10-09 ENCOUNTER — Other Ambulatory Visit: Payer: Self-pay

## 2020-10-09 NOTE — Telephone Encounter (Signed)
Cancelled PV and colon.  Letter mailed. maw

## 2020-10-09 NOTE — Progress Notes (Unsigned)
Phone call to pt [phone] and [facility] answered.  He is her significant other.  He states, "I have not seen her in weeks".  "She comes and goes".  "I cant remember the telephone number to reach her at now".  I explained that this was the only telephone number in her chart.  [name] said he will try to get in touch with [facility].  I asked him to let her know we will have to cancel her appointment for her colonoscopy for 10/23/20 at 11:00 am if she does not call me back today before 5:00 pm.  He said he will try to give her this message. I also notified him we will mail a letter to pt, which is [name]'s address. maw

## 2020-10-09 NOTE — Telephone Encounter (Signed)
   My Note     11:30 AM    Edit   Delete   Copy      Phone call to pt [phone] and [facility] answered.  He is her significant other.  He states, "I have not seen her in weeks".  "She comes and goes".  "I cant remember the telephone number to reach her at now".  I explained that this was the only telephone number in her chart.  [name] said he will try to get in touch with [facility].  I asked him to let her know we will have to cancel her appointment for her colonoscopy for 10/23/20 at 11:00 am if she does not call me back today before 5:00 pm.  He said he will try to give her this message. I also notified him we will mail a letter to pt, which is Mark's address. maw

## 2020-10-19 ENCOUNTER — Other Ambulatory Visit: Payer: Self-pay | Admitting: Internal Medicine

## 2020-10-19 ENCOUNTER — Other Ambulatory Visit: Payer: Self-pay

## 2020-10-19 DIAGNOSIS — E785 Hyperlipidemia, unspecified: Secondary | ICD-10-CM

## 2020-10-19 NOTE — Telephone Encounter (Signed)
Requested medication (s) are due for refill today: Yes  Requested medication (s) are on the active medication list: Yes  Last refill:  07/16/19  Future visit scheduled: No  Notes to clinic:  Prescription expired.    Requested Prescriptions  Pending Prescriptions Disp Refills   atorvastatin (LIPITOR) 10 MG tablet 30 tablet 3    Sig: TAKE 0.5 TABLETS (5 MG TOTAL) BY MOUTH DAILY.      Cardiovascular:  Antilipid - Statins Failed - 10/19/2020  9:57 AM      Failed - Triglycerides in normal range and within 360 days    Triglycerides  Date Value Ref Range Status  06/29/2020 277 (H) 0 - 149 mg/dL Final          Passed - Total Cholesterol in normal range and within 360 days    Cholesterol, Total  Date Value Ref Range Status  06/29/2020 187 100 - 199 mg/dL Final          Passed - LDL in normal range and within 360 days    LDL Chol Calc (NIH)  Date Value Ref Range Status  06/29/2020 80 0 - 99 mg/dL Final          Passed - HDL in normal range and within 360 days    HDL  Date Value Ref Range Status  06/29/2020 62 >39 mg/dL Final          Passed - Patient is not pregnant      Passed - Valid encounter within last 12 months    Recent Outpatient Visits           3 months ago Hypertensive kidney disease with chronic kidney disease stage IV ([place])   [place] Ladell Pier, MD   1 year ago Hospital discharge follow-up   [place], [name], MD   1 year ago Essential hypertension   [place], [name], MD   3 years ago Hyperlipidemia, unspecified hyperlipidemia type   [place], [place], [place]   3 years ago Mastalgia in female   [place], Dalbert Batman, MD

## 2020-10-23 ENCOUNTER — Encounter: Payer: Self-pay | Admitting: Gastroenterology

## 2020-10-24 ENCOUNTER — Ambulatory Visit (AMBULATORY_SURGERY_CENTER): Payer: Self-pay | Admitting: *Deleted

## 2020-10-24 ENCOUNTER — Other Ambulatory Visit: Payer: Self-pay

## 2020-10-24 VITALS — Ht 67.0 in | Wt 129.2 lb

## 2020-10-24 DIAGNOSIS — Z86010 Personal history of colonic polyps: Secondary | ICD-10-CM

## 2020-10-24 NOTE — Progress Notes (Signed)
  No trouble with anesthesia, denies being told they were difficult to intubate, or hx/fam hx of malignant hyperthermia per pt   No egg or soy allergy  No home oxygen use   No medications for weight loss taken  Pt constipation issues  2 day prep given per DO- Plenvu sample given  Pt is unsure if she has a hx of PFO= note put on chart to use our special tubing

## 2020-10-25 ENCOUNTER — Other Ambulatory Visit: Payer: Self-pay

## 2020-10-26 ENCOUNTER — Ambulatory Visit: Payer: Self-pay | Admitting: Internal Medicine

## 2020-10-26 ENCOUNTER — Other Ambulatory Visit: Payer: Self-pay

## 2020-10-30 ENCOUNTER — Other Ambulatory Visit: Payer: Self-pay | Admitting: Internal Medicine

## 2020-10-30 ENCOUNTER — Other Ambulatory Visit: Payer: Self-pay

## 2020-10-30 DIAGNOSIS — J449 Chronic obstructive pulmonary disease, unspecified: Secondary | ICD-10-CM

## 2020-10-30 MED ORDER — ALBUTEROL SULFATE HFA 108 (90 BASE) MCG/ACT IN AERS
2.0000 | INHALATION_SPRAY | Freq: Four times a day (QID) | RESPIRATORY_TRACT | 2 refills | Status: DC | PRN
  Filled 2020-10-30 – 2020-12-04 (×2): qty 18, 25d supply, fill #0
  Filled 2020-12-26: qty 18, 25d supply, fill #1

## 2020-10-30 NOTE — Telephone Encounter (Signed)
  Notes to clinic:  Requested script has expired  Review for continued use and refill    Requested Prescriptions  Pending Prescriptions Disp Refills   albuterol (VENTOLIN HFA) 108 (90 Base) MCG/ACT inhaler 18 g 6    Sig: INHALE 2 PUFFS INTO THE LUNGS EVERY 6 HOURS AS NEEDED FOR WHEEZING OR SHORTNESS OF BREATH.     Pulmonology:  Beta Agonists Failed - 10/30/2020 11:46 AM      Failed - One inhaler should last at least one month. If the patient is requesting refills earlier, contact the patient to check for uncontrolled symptoms.      Passed - Valid encounter within last 12 months    Recent Outpatient Visits           4 months ago Hypertensive kidney disease with chronic kidney disease stage IV [facility])   [place] Ladell Pier, MD   1 year ago Hospital discharge follow-up   [place], MD   1 year ago Essential hypertension   [place], MD   3 years ago Hyperlipidemia, unspecified hyperlipidemia type   [place], [place], [place]   3 years ago Mastalgia in female   [place], MD       Future Appointments             In 1 month Wynetta Emer[place] Dalbert Batman, MD [place]

## 2020-10-31 ENCOUNTER — Other Ambulatory Visit: Payer: Self-pay

## 2020-11-07 ENCOUNTER — Other Ambulatory Visit: Payer: Self-pay

## 2020-11-07 ENCOUNTER — Encounter: Payer: Self-pay | Admitting: Gastroenterology

## 2020-11-07 ENCOUNTER — Ambulatory Visit (AMBULATORY_SURGERY_CENTER): Payer: Self-pay | Admitting: Gastroenterology

## 2020-11-07 VITALS — BP 154/76 | HR 71 | Temp 98.9°F | Resp 20 | Ht 67.0 in | Wt 129.2 lb

## 2020-11-07 DIAGNOSIS — K635 Polyp of colon: Secondary | ICD-10-CM

## 2020-11-07 DIAGNOSIS — D122 Benign neoplasm of ascending colon: Secondary | ICD-10-CM

## 2020-11-07 DIAGNOSIS — Z86010 Personal history of colonic polyps: Secondary | ICD-10-CM

## 2020-11-07 DIAGNOSIS — D123 Benign neoplasm of transverse colon: Secondary | ICD-10-CM

## 2020-11-07 HISTORY — PX: COLONOSCOPY: SHX174

## 2020-11-07 MED ORDER — SODIUM CHLORIDE 0.9 % IV SOLN: 500.0000 mL | Freq: Once | INTRAVENOUS | Status: DC

## 2020-11-07 NOTE — Progress Notes (Signed)
Called to room to assist during endoscopic procedure.  Patient ID and intended procedure confirmed with present staff. Received instructions for my participation in the procedure from the performing physician.  

## 2020-11-07 NOTE — Patient Instructions (Signed)
Handouts on polyps & diverticulosis given to you today  Await pathology results on polyps removed    YOU HAD AN ENDOSCOPIC PROCEDURE TODAY AT [facility]:   Refer to the procedure report that was given to you for any specific questions about what was found during the examination.  If the procedure report does not answer your questions, please call your gastroenterologist to clarify.  If you requested that your care partner not be given the details of your procedure findings, then the procedure report has been included in a sealed envelope for you to review at your convenience later.  YOU SHOULD EXPECT: Some feelings of bloating in the abdomen. Passage of more gas than usual.  Walking can help get rid of the air that was put into your GI tract during the procedure and reduce the bloating. If you had a lower endoscopy (such as a colonoscopy or flexible sigmoidoscopy) you may notice spotting of blood in your stool or on the toilet paper. If you underwent a bowel prep for your procedure, you may not have a normal bowel movement for a few days.  Please Note:  You might notice some irritation and congestion in your nose or some drainage.  This is from the oxygen used during your procedure.  There is no need for concern and it should clear up in a day or so.  SYMPTOMS TO REPORT IMMEDIATELY:  Following lower endoscopy (colonoscopy or flexible sigmoidoscopy):  Excessive amounts of blood in the stool  Significant tenderness or worsening of abdominal pains  Swelling of the abdomen that is new, acute  Fever of 100F or higher   For urgent or emergent issues, a gastroenterologist can be reached at any hour by calling [phone]. Do not use MyChart messaging for urgent concerns.    DIET:  We do recommend a small meal at first, but then you may proceed to your regular diet.  Drink plenty of fluids but you should avoid alcoholic beverages for 24 hours.  ACTIVITY:  You should plan  to take it easy for the rest of today and you should NOT DRIVE or use heavy machinery until tomorrow (because of the sedation medicines used during the test).    FOLLOW UP: Our staff will call the number listed on your records 48-72 hours following your procedure to check on you and address any questions or concerns that you may have regarding the information given to you following your procedure. If we do not reach you, we will leave a message.  We will attempt to reach you two times.  During this call, we will ask if you have developed any symptoms of COVID 19. If you develop any symptoms (ie: fever, flu-like symptoms, shortness of breath, cough etc.) before then, please call [phone].  If you test positive for Covid 19 in the 2 weeks post procedure, please call and report this information to [place].    If any biopsies were taken you will be contacted by phone or by letter within the next 1-3 weeks.  Please call us at [phone] if you have not heard about the biopsies in 3 weeks.    SIGNATURES/CONFIDENTIALITY: You and/or your care partner have signed paperwork which will be entered into your electronic medical record.  These signatures attest to the fact that that the information above on your After Visit Summary has been reviewed and is understood.  Full responsibility of the confidentiality of this discharge information lies with you and/or your  care-partner.

## 2020-11-07 NOTE — Progress Notes (Signed)
To PACU, vss. Report to [email or web address] 

## 2020-11-07 NOTE — Progress Notes (Signed)
Pt's states no medical or surgical changes since previsit or office visit.  Vitals DT 

## 2020-11-07 NOTE — Op Note (Signed)
[facility] Patient Name: [name] Procedure Date: 11/07/2020 1:23 PM MRN: [id number] Endoscopist: Mallie Mussel L. Loletha Carrow , MD Age: 60 Referring MD:  Date of Birth: 08-31-1960 Gender: Female Account #: [id number] Procedure:                Colonoscopy Indications:              Surveillance: Personal history of adenomatous                            polyps on last colonoscopy > 3 years ago Medicines:                Monitored Anesthesia Care Procedure:                Pre-Anesthesia Assessment:                           - Prior to the procedure, a History and Physical                            was performed, and patient medications and                            allergies were reviewed. The patient's tolerance of                            previous anesthesia was also reviewed. The risks                            and benefits of the procedure and the sedation                            options and risks were discussed with the patient.                            All questions were answered, and informed consent                            was obtained. Prior Anticoagulants: The patient has                            taken no previous anticoagulant or antiplatelet                            agents. ASA Grade Assessment: III - A patient with                            severe systemic disease. After reviewing the risks                            and benefits, the patient was deemed in                            satisfactory condition to undergo the procedure.  After obtaining informed consent, the colonoscope                            was passed under direct vision. Throughout the                            procedure, the patient's blood pressure, pulse, and                            oxygen saturations were monitored continuously. The                            Olympus PCF-H190DL (#[id number]) Colonoscope was                            introduced through the  anus and advanced to the the                            cecum, identified by appendiceal orifice and                            ileocecal valve. The colonoscopy was performed                            without difficulty. The patient tolerated the                            procedure well. The quality of the bowel                            preparation was initially fair (improved with                            lavage). The ileocecal valve, appendiceal orifice,                            and rectum were photographed. The bowel preparation                            used was 2 day Suprep/Miralax. Scope In: 1:50:44 PM Scope Out: 2:11:36 PM Scope Withdrawal Time: 0 hours 15 minutes 47 seconds  Total Procedure Duration: 0 hours 20 minutes 52 seconds  Findings:                 The perianal and digital rectal examinations were                            normal.                           A 12 mm polyp was found in the proximal ascending                            colon. The polyp was sessile. The polyp was removed  with a hot snare. Resection and retrieval were                            complete.                           A 10 mm polyp was found in the transverse colon.                            The polyp was semi-sessile. The polyp was removed                            with a cold snare. Resection and retrieval were                            complete.                           Multiple diverticula were found in the left colon.                           The exam was otherwise without abnormality on                            direct and retroflexion views. Complications:            No immediate complications. Estimated Blood Loss:     Estimated blood loss was minimal. Impression:               - Preparation of the colon was fair.                           - One 12 mm polyp in the proximal ascending colon,                            removed with a hot snare.  Resected and retrieved.                           - One 10 mm polyp in the transverse colon, removed                            with a cold snare. Resected and retrieved.                           - Diverticulosis in the left colon.                           - The examination was otherwise normal on direct                            and retroflexion views. Recommendation:           - Patient has a contact number available for  emergencies. The signs and symptoms of potential                            delayed complications were discussed with the                            patient. Return to normal activities tomorrow.                            Written discharge instructions were provided to the                            patient.                           - Resume previous diet.                           - Continue present medications.                           - Await pathology results.                           - Repeat colonoscopy in 2 years for surveillance.                            split-dose 4 liter PEG prep for next exam [name] L. Loletha Carrow, MD 11/07/2020 2:23:27 PM This report has been signed electronically.

## 2020-11-07 NOTE — Progress Notes (Signed)
History and Physical:  This patient presents for endoscopic testing for: Encounter Diagnosis  Name Primary?   Personal history of colonic polyps Yes   Patient has no complaints today.   Past Medical History: Past Medical History:  Diagnosis Date   Alcohol abuse    Allergy    Anxiety    Arthritis    Asthma    Cannabis abuse    Cocaine abuse ([place])    COPD (chronic obstructive pulmonary disease) (HCC)    Depression    Drug addiction ([place])    GERD (gastroesophageal reflux disease)    Heart murmur    Homelessness    Hyperlipidemia    Hypertension    pt stated "every once in a while BP will be high but has not been prescribed medication for HTN.    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) ([place]) 02/22/2016     Past Surgical History: Past Surgical History:  Procedure Laterality Date   BIOPSY  01/01/2019   Procedure: BIOPSY;  Surgeon: Jerene Bears, MD;  Location: [facility] ENDOSCOPY;  Service: Endoscopy;;   [place]   COLONOSCOPY  11/07/2020   2018   CYSTOSCOPY W/ URETERAL STENT PLACEMENT Left 08/01/2018   Procedure: CYSTOSCOPY WITH RETROGRADE PYELOGRAM/URETERAL STENT PLACEMENT;  Surgeon: Lucas Mallow, MD;  Location: [facility] ORS;  Service: Urology;  Laterality: Left;   CYSTOSCOPY WITH RETROGRADE PYELOGRAM, URETEROSCOPY AND STENT PLACEMENT Left 01/15/2019   Procedure: CYSTOSCOPY WITH RETROGRADE PYELOGRAM, URETEROSCOPY AND STENT PLACEMENT;  Surgeon: Lucas Mallow, MD;  Location: [facility] ORS;  Service: Urology;  Laterality: Left;   ESOPHAGOGASTRODUODENOSCOPY (EGD) WITH PROPOFOL N/A 01/01/2019   Procedure: ESOPHAGOGASTRODUODENOSCOPY (EGD) WITH PROPOFOL;  Surgeon: Jerene Bears, MD;  Location: [facility] ENDOSCOPY;  Service: Endoscopy;  Laterality: N/A;   ESOPHAGOGASTRODUODENOSCOPY (EGD) WITH PROPOFOL N/A 08/21/2019   Procedure: ESOPHAGOGASTRODUODENOSCOPY (EGD)  WITH PROPOFOL;  Surgeon: Mauri Pole, MD;  Location: [facility] ENDOSCOPY;  Service: Endoscopy;  Laterality: N/A;   FRACTURE SURGERY Left 2011   arm   UPPER GASTROINTESTINAL ENDOSCOPY      Allergies: Allergies  Allergen Reactions   Lisinopril Other (See Comments)    hyperkalemia    Outpatient Meds: Current Outpatient Medications  Medication Sig Dispense Refill   amLODipine (NORVASC) 5 MG tablet Take 1 tablet (5 mg total) by mouth daily. 90 tablet 3   gabapentin (NEURONTIN) 100 MG capsule Take 1 capsule (100 mg total) by mouth 3 (three) times daily. 90 capsule 3   Multiple Vitamin (MULTIVITAMIN WITH MINERALS) TABS tablet Take 1 tablet by mouth daily. 30 tablet 1   traZODone (DESYREL) 50 MG tablet Take 1 tablet (50 mg total) by mouth at bedtime as needed for sleep. 30 tablet 1   albuterol (VENTOLIN HFA) 108 (90 Base) MCG/ACT inhaler INHALE 2 PUFFS INTO THE LUNGS EVERY 6 HOURS AS NEEDED FOR WHEEZING OR SHORTNESS OF BREATH. 18 g 2   atorvastatin (LIPITOR) 10 MG tablet TAKE 0.5 TABLETS (5 MG TOTAL) BY MOUTH DAILY. (Patient not taking: Reported on 11/07/2020) 30 tablet 3   famotidine (PEPCID) 20 MG tablet Take 1 tablet (20 mg total) by mouth 2 (two) times daily. (Patient not taking: Reported on 11/07/2020) 60 tablet 1   ferrous sulfate 325 (65 FE) MG tablet Take 1 tablet (325 mg total) by mouth daily with breakfast. 90 tablet 0  sodium bicarbonate 650 MG tablet Take 1 tablet (650 mg total) by mouth 2 (two) times daily. (Patient not taking: Reported on 11/07/2020) 60 tablet 3   Current Facility-Administered Medications  Medication Dose Route Frequency Provider Last Rate Last Admin   0.9 %  sodium chloride infusion  500 mL Intravenous Once Doran Stabler, MD          ___________________________________________________________________ Objective   Exam:  BP (!) 164/84   Pulse 80   Temp 98.9 F (37.2 C) (Temporal)   Resp 11   Ht 5\' 7"  (1.702 m)   Wt 129 lb 3.2 oz (58.6 kg)   SpO2  100%   BMI 20.24 kg/m   CV: RRR without murmur, S1/S2 Resp: clear to auscultation bilaterally, normal RR and effort noted GI: soft, no tenderness, with active bowel sounds.   CBC Latest Ref Rng & Units 08/02/2020 06/29/2020 12/31/2019  WBC 4.0 - 10.5 K/uL 5.3 5.8 5.6  Hemoglobin 12.0 - 15.0 g/dL 8.4(L) 8.2(L) 9.6(L)  Hematocrit 36.0 - 46.0 % 25.5(L) 24.1(L) 27.2(L)  Platelets 150 - 400 K/uL 263 291 224   CMP Latest Ref Rng & Units 08/02/2020 06/29/2020 01/01/2020  Glucose 70 - 99 mg/dL 106(H) 78 98  BUN 6 - 20 mg/dL 31(H) 47(H) 27(H)  Creatinine 0.44 - 1.00 mg/dL 3.34(H) 4.32(H) 2.62(H)  Sodium 135 - 145 mmol/L 130(L) 134 136  Potassium 3.5 - 5.1 mmol/L 3.8 3.9 3.5  Chloride 98 - 111 mmol/L 102 97 112(H)  CO2 22 - 32 mmol/L 14(L) 11(L) 16(L)  Calcium 8.9 - 10.3 mg/dL 8.5(L) 8.8 8.2(L)  Total Protein 6.5 - 8.1 g/dL 7.2 7.8 -  Total Bilirubin 0.3 - 1.2 mg/dL 0.4 0.3 -  Alkaline Phos 38 - 126 U/L 102 137(H) -  AST 15 - 41 U/L 41 35 -  ALT 0 - 44 U/L 25 19 -    Assessment: Encounter Diagnosis  Name Primary?   Personal history of colonic polyps Yes     Plan: Colonoscopy   The patient is appropriate for an endoscopic procedure in the ambulatory setting.   - Wilfrid Lund, MD

## 2020-11-09 ENCOUNTER — Telehealth: Payer: Self-pay

## 2020-11-09 NOTE — Telephone Encounter (Signed)
  Follow up Call-  Call back number 11/07/2020  Post procedure Call Back phone  # [phone]  Permission to leave phone message Yes  Some recent data might be hidden     Patient questions:  Do you have a fever, pain , or abdominal swelling? No. Pain Score  0 *  Have you tolerated food without any problems? Yes.    Have you been able to return to your normal activities? Yes.    Do you have any questions about your discharge instructions: Diet   No. Medications  No. Follow up visit  No.  Do you have questions or concerns about your Care? No.  Actions: * If pain score is 4 or above: No action needed, pain <4.

## 2020-11-13 ENCOUNTER — Other Ambulatory Visit: Payer: Self-pay

## 2020-11-14 ENCOUNTER — Encounter: Payer: Self-pay | Admitting: Gastroenterology

## 2020-12-04 ENCOUNTER — Other Ambulatory Visit: Payer: Self-pay

## 2020-12-04 ENCOUNTER — Other Ambulatory Visit: Payer: Self-pay | Admitting: Internal Medicine

## 2020-12-04 DIAGNOSIS — F330 Major depressive disorder, recurrent, mild: Secondary | ICD-10-CM

## 2020-12-04 MED ORDER — TRAZODONE HCL 50 MG PO TABS
50.0000 mg | ORAL_TABLET | Freq: Every evening | ORAL | 0 refills | Status: DC | PRN
  Filled 2020-12-04: qty 30, 30d supply, fill #0

## 2020-12-04 NOTE — Telephone Encounter (Signed)
Future in 3 weeks

## 2020-12-05 ENCOUNTER — Other Ambulatory Visit: Payer: Self-pay

## 2020-12-06 ENCOUNTER — Other Ambulatory Visit: Payer: Self-pay

## 2020-12-26 ENCOUNTER — Encounter: Payer: Self-pay | Admitting: Internal Medicine

## 2020-12-26 ENCOUNTER — Ambulatory Visit: Payer: Self-pay | Attending: Internal Medicine | Admitting: Internal Medicine

## 2020-12-26 ENCOUNTER — Other Ambulatory Visit: Payer: Self-pay

## 2020-12-26 VITALS — BP 198/85 | HR 82 | Resp 16 | Wt 129.0 lb

## 2020-12-26 DIAGNOSIS — B182 Chronic viral hepatitis C: Secondary | ICD-10-CM | POA: Insufficient documentation

## 2020-12-26 DIAGNOSIS — N184 Chronic kidney disease, stage 4 (severe): Secondary | ICD-10-CM | POA: Insufficient documentation

## 2020-12-26 DIAGNOSIS — Z5986 Financial insecurity: Secondary | ICD-10-CM | POA: Insufficient documentation

## 2020-12-26 DIAGNOSIS — Z23 Encounter for immunization: Secondary | ICD-10-CM | POA: Insufficient documentation

## 2020-12-26 DIAGNOSIS — Z760 Encounter for issue of repeat prescription: Secondary | ICD-10-CM | POA: Insufficient documentation

## 2020-12-26 DIAGNOSIS — N189 Chronic kidney disease, unspecified: Secondary | ICD-10-CM | POA: Insufficient documentation

## 2020-12-26 DIAGNOSIS — Z560 Unemployment, unspecified: Secondary | ICD-10-CM | POA: Insufficient documentation

## 2020-12-26 DIAGNOSIS — Z5901 Sheltered homelessness: Secondary | ICD-10-CM | POA: Insufficient documentation

## 2020-12-26 DIAGNOSIS — Z86010 Personal history of colonic polyps: Secondary | ICD-10-CM | POA: Insufficient documentation

## 2020-12-26 DIAGNOSIS — Z79899 Other long term (current) drug therapy: Secondary | ICD-10-CM | POA: Insufficient documentation

## 2020-12-26 DIAGNOSIS — G8929 Other chronic pain: Secondary | ICD-10-CM | POA: Insufficient documentation

## 2020-12-26 DIAGNOSIS — F330 Major depressive disorder, recurrent, mild: Secondary | ICD-10-CM

## 2020-12-26 DIAGNOSIS — E785 Hyperlipidemia, unspecified: Secondary | ICD-10-CM | POA: Insufficient documentation

## 2020-12-26 DIAGNOSIS — I129 Hypertensive chronic kidney disease with stage 1 through stage 4 chronic kidney disease, or unspecified chronic kidney disease: Secondary | ICD-10-CM | POA: Insufficient documentation

## 2020-12-26 DIAGNOSIS — F17210 Nicotine dependence, cigarettes, uncomplicated: Secondary | ICD-10-CM | POA: Insufficient documentation

## 2020-12-26 DIAGNOSIS — Z888 Allergy status to other drugs, medicaments and biological substances status: Secondary | ICD-10-CM | POA: Insufficient documentation

## 2020-12-26 DIAGNOSIS — M79605 Pain in left leg: Secondary | ICD-10-CM | POA: Insufficient documentation

## 2020-12-26 DIAGNOSIS — F419 Anxiety disorder, unspecified: Secondary | ICD-10-CM | POA: Insufficient documentation

## 2020-12-26 DIAGNOSIS — J449 Chronic obstructive pulmonary disease, unspecified: Secondary | ICD-10-CM | POA: Insufficient documentation

## 2020-12-26 DIAGNOSIS — F17200 Nicotine dependence, unspecified, uncomplicated: Secondary | ICD-10-CM

## 2020-12-26 DIAGNOSIS — G629 Polyneuropathy, unspecified: Secondary | ICD-10-CM | POA: Insufficient documentation

## 2020-12-26 DIAGNOSIS — D500 Iron deficiency anemia secondary to blood loss (chronic): Secondary | ICD-10-CM | POA: Insufficient documentation

## 2020-12-26 DIAGNOSIS — M79604 Pain in right leg: Secondary | ICD-10-CM | POA: Insufficient documentation

## 2020-12-26 MED ORDER — GABAPENTIN 100 MG PO CAPS
100.0000 mg | ORAL_CAPSULE | Freq: Three times a day (TID) | ORAL | 3 refills | Status: DC
  Filled 2020-12-26 – 2021-01-02 (×2): qty 90, 30d supply, fill #0
  Filled 2021-02-06 – 2021-02-26 (×2): qty 90, 30d supply, fill #1
  Filled 2021-04-11 – 2021-10-29 (×2): qty 90, 30d supply, fill #0
  Filled 2021-12-25: qty 90, 30d supply, fill #1

## 2020-12-26 MED ORDER — FAMOTIDINE 20 MG PO TABS
20.0000 mg | ORAL_TABLET | Freq: Two times a day (BID) | ORAL | 3 refills | Status: DC
  Filled 2020-12-26 – 2021-01-02 (×2): qty 60, 30d supply, fill #0
  Filled 2021-02-26: qty 60, 30d supply, fill #1
  Filled 2021-04-11 – 2021-10-29 (×2): qty 60, 30d supply, fill #0

## 2020-12-26 MED ORDER — AMLODIPINE BESYLATE 5 MG PO TABS
5.0000 mg | ORAL_TABLET | Freq: Every day | ORAL | 3 refills | Status: DC
  Filled 2020-12-26 – 2021-01-02 (×2): qty 30, 30d supply, fill #0
  Filled 2021-02-06 – 2021-02-26 (×2): qty 30, 30d supply, fill #1
  Filled 2021-04-11 – 2021-10-29 (×2): qty 30, 30d supply, fill #0

## 2020-12-26 MED ORDER — ALBUTEROL SULFATE HFA 108 (90 BASE) MCG/ACT IN AERS
2.0000 | INHALATION_SPRAY | Freq: Four times a day (QID) | RESPIRATORY_TRACT | 2 refills | Status: DC | PRN
  Filled 2021-02-26 – 2021-10-29 (×2): qty 18, 25d supply, fill #0

## 2020-12-26 MED ORDER — TRAZODONE HCL 50 MG PO TABS
50.0000 mg | ORAL_TABLET | Freq: Every evening | ORAL | 3 refills | Status: DC | PRN
  Filled 2020-12-26 – 2021-01-02 (×2): qty 30, 30d supply, fill #0
  Filled 2021-02-06 – 2021-02-26 (×2): qty 30, 30d supply, fill #1
  Filled 2021-04-11 – 2021-10-29 (×2): qty 30, 30d supply, fill #0
  Filled 2021-12-25: qty 30, 30d supply, fill #1
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
## 2020-12-26 NOTE — Patient Instructions (Addendum)
Please take your iron supplement every day as prescribed.  Your blood pressure is elevated.  This will do further damage to your kidney.  Please take the blood pressure lowering medication called amlodipine daily as prescribed.  I have resubmitted the referral for you to see the infectious disease specialist and the kidney specialist.  I will have our caseworker follow-up with you to answer your questions about applying for Medicare part B.

## 2020-12-27 ENCOUNTER — Telehealth: Payer: Self-pay

## 2020-12-27 LAB — HEPATITIS B SURFACE ANTIGEN: Hepatitis B Surface Ag: NEGATIVE

## 2020-12-27 LAB — HEPATITIS B SURFACE ANTIBODY, QUANTITATIVE: Hepatitis B Surf Ab Quant: <3.1 m[IU]/mL — ABNORMAL LOW (ref >9.9)

## 2020-12-27 NOTE — Progress Notes (Signed)
Let patient know that based on lab results, she has not been vaccinated for hepatitis B.  Please schedule an appointment for her to come in as a nurse only visit to start the hepatitis B vaccine series.

## 2020-12-27 NOTE — Telephone Encounter (Signed)
Contacted pt to go over lab results pt didn't answer lvm   Sent a CRM and forward labs to NT to give pt labs when they call back   

## 2021-01-01 ENCOUNTER — Ambulatory Visit: Payer: Self-pay

## 2021-01-02 ENCOUNTER — Other Ambulatory Visit: Payer: Self-pay

## 2021-01-03 ENCOUNTER — Ambulatory Visit: Payer: Self-pay | Attending: Family Medicine

## 2021-01-03 ENCOUNTER — Other Ambulatory Visit: Payer: Self-pay

## 2021-01-25 ENCOUNTER — Telehealth (INDEPENDENT_AMBULATORY_CARE_PROVIDER_SITE_OTHER): Payer: Self-pay

## 2021-01-25 NOTE — Telephone Encounter (Signed)
Copied from [place] [id number]. Topic: General - Other >> Jan 23, 2021  9:27 AM Celene Kras wrote: Reason for CRM: Pt called and is requesting Hep B injection.      She is also requesting to have a call back from financial advisor. Please advise.

## 2021-01-25 NOTE — Telephone Encounter (Signed)
Contacted pt to schedule next Hep B injection pt answered then hung up.. If pt calls back please schedule nurse visit for Hep B

## 2021-01-25 NOTE — Telephone Encounter (Signed)
Pt received 1st Hep B injection on 01/03/21. I will contact pt to schedule nurse visit for net Hep B

## 2021-01-29 ENCOUNTER — Ambulatory Visit: Payer: Self-pay | Attending: Internal Medicine

## 2021-01-29 ENCOUNTER — Other Ambulatory Visit: Payer: Self-pay

## 2021-01-29 DIAGNOSIS — Z23 Encounter for immunization: Secondary | ICD-10-CM

## 2021-01-29 DIAGNOSIS — B182 Chronic viral hepatitis C: Secondary | ICD-10-CM

## 2021-01-29 NOTE — Progress Notes (Signed)
Pt arrived for 2nd Hep B vaccine Pt given vaccine in right deltoid Pt to return in 6 months to get 3rd dose.

## 2021-02-06 ENCOUNTER — Other Ambulatory Visit: Payer: Self-pay

## 2021-02-13 ENCOUNTER — Other Ambulatory Visit: Payer: Self-pay

## 2021-02-26 ENCOUNTER — Other Ambulatory Visit: Payer: Self-pay

## 2021-03-05 ENCOUNTER — Other Ambulatory Visit: Payer: Self-pay

## 2021-04-11 ENCOUNTER — Other Ambulatory Visit: Payer: Self-pay

## 2021-04-18 ENCOUNTER — Other Ambulatory Visit: Payer: Self-pay

## 2021-10-29 ENCOUNTER — Other Ambulatory Visit: Payer: Self-pay

## 2021-11-02 ENCOUNTER — Telehealth: Payer: Self-pay | Admitting: Internal Medicine

## 2021-11-02 NOTE — Telephone Encounter (Signed)
Patient has been called and scheduled an appointment with her PCP, pt does not need wellness exam.

## 2021-11-02 NOTE — Telephone Encounter (Signed)
Copied from [place] [id number]. Topic: Appointment Scheduling - Scheduling Inquiry for Clinic >> Nov 02, 2021 11:08 AM Leitha Schuller wrote: Pt needing to schedule an awv  Please assist further

## 2021-11-15 ENCOUNTER — Inpatient Hospital Stay (HOSPITAL_COMMUNITY): Payer: Medicare Other

## 2021-11-15 ENCOUNTER — Ambulatory Visit: Payer: Self-pay | Admitting: Internal Medicine

## 2021-11-15 ENCOUNTER — Emergency Department (HOSPITAL_COMMUNITY): Payer: Medicare Other

## 2021-11-15 ENCOUNTER — Encounter (HOSPITAL_COMMUNITY): Payer: Self-pay

## 2021-11-15 ENCOUNTER — Other Ambulatory Visit: Payer: Self-pay

## 2021-11-15 ENCOUNTER — Inpatient Hospital Stay (HOSPITAL_COMMUNITY)
Admission: EM | Admit: 2021-11-15 | Discharge: 2021-11-18 | DRG: 811 | Disposition: A | Discharge status: Home / Self Care | Payer: Medicare Other | Attending: Internal Medicine | Admitting: Internal Medicine

## 2021-11-15 DIAGNOSIS — R262 Difficulty in walking, not elsewhere classified: Secondary | ICD-10-CM | POA: Diagnosis present

## 2021-11-15 DIAGNOSIS — R778 Other specified abnormalities of plasma proteins: Secondary | ICD-10-CM

## 2021-11-15 DIAGNOSIS — N184 Chronic kidney disease, stage 4 (severe): Secondary | ICD-10-CM | POA: Diagnosis present

## 2021-11-15 DIAGNOSIS — B182 Chronic viral hepatitis C: Secondary | ICD-10-CM | POA: Diagnosis present

## 2021-11-15 DIAGNOSIS — F17210 Nicotine dependence, cigarettes, uncomplicated: Secondary | ICD-10-CM | POA: Diagnosis present

## 2021-11-15 DIAGNOSIS — Z20822 Contact with and (suspected) exposure to covid-19: Secondary | ICD-10-CM | POA: Diagnosis present

## 2021-11-15 DIAGNOSIS — Z7951 Long term (current) use of inhaled steroids: Secondary | ICD-10-CM

## 2021-11-15 DIAGNOSIS — D649 Anemia, unspecified: Secondary | ICD-10-CM

## 2021-11-15 DIAGNOSIS — F339 Major depressive disorder, recurrent, unspecified: Secondary | ICD-10-CM | POA: Diagnosis present

## 2021-11-15 DIAGNOSIS — Z8719 Personal history of other diseases of the digestive system: Secondary | ICD-10-CM | POA: Diagnosis not present

## 2021-11-15 DIAGNOSIS — Z9889 Other specified postprocedural states: Secondary | ICD-10-CM

## 2021-11-15 DIAGNOSIS — Z79899 Other long term (current) drug therapy: Secondary | ICD-10-CM

## 2021-11-15 DIAGNOSIS — J441 Chronic obstructive pulmonary disease with (acute) exacerbation: Secondary | ICD-10-CM | POA: Diagnosis present

## 2021-11-15 DIAGNOSIS — K2100 Gastro-esophageal reflux disease with esophagitis, without bleeding: Secondary | ICD-10-CM | POA: Diagnosis present

## 2021-11-15 DIAGNOSIS — K2971 Gastritis, unspecified, with bleeding: Secondary | ICD-10-CM | POA: Diagnosis present

## 2021-11-15 DIAGNOSIS — D509 Iron deficiency anemia, unspecified: Secondary | ICD-10-CM | POA: Diagnosis present

## 2021-11-15 DIAGNOSIS — D62 Acute posthemorrhagic anemia: Secondary | ICD-10-CM | POA: Diagnosis present

## 2021-11-15 DIAGNOSIS — M79605 Pain in left leg: Secondary | ICD-10-CM | POA: Diagnosis present

## 2021-11-15 DIAGNOSIS — F1011 Alcohol abuse, in remission: Secondary | ICD-10-CM | POA: Diagnosis present

## 2021-11-15 DIAGNOSIS — N179 Acute kidney failure, unspecified: Secondary | ICD-10-CM | POA: Diagnosis present

## 2021-11-15 DIAGNOSIS — Z86010 Personal history of colonic polyps: Secondary | ICD-10-CM

## 2021-11-15 DIAGNOSIS — T423X6A Underdosing of barbiturates, initial encounter: Secondary | ICD-10-CM | POA: Diagnosis present

## 2021-11-15 DIAGNOSIS — Z5901 Sheltered homelessness: Secondary | ICD-10-CM

## 2021-11-15 DIAGNOSIS — Z888 Allergy status to other drugs, medicaments and biological substances status: Secondary | ICD-10-CM

## 2021-11-15 DIAGNOSIS — D631 Anemia in chronic kidney disease: Secondary | ICD-10-CM | POA: Diagnosis present

## 2021-11-15 DIAGNOSIS — K922 Gastrointestinal hemorrhage, unspecified: Secondary | ICD-10-CM | POA: Diagnosis present

## 2021-11-15 DIAGNOSIS — K254 Chronic or unspecified gastric ulcer with hemorrhage: Secondary | ICD-10-CM | POA: Diagnosis present

## 2021-11-15 DIAGNOSIS — E785 Hyperlipidemia, unspecified: Secondary | ICD-10-CM | POA: Diagnosis present

## 2021-11-15 DIAGNOSIS — R1310 Dysphagia, unspecified: Secondary | ICD-10-CM | POA: Diagnosis present

## 2021-11-15 DIAGNOSIS — K713 Toxic liver disease with chronic persistent hepatitis: Secondary | ICD-10-CM | POA: Diagnosis present

## 2021-11-15 DIAGNOSIS — N271 Small kidney, bilateral: Secondary | ICD-10-CM | POA: Diagnosis present

## 2021-11-15 DIAGNOSIS — Z91148 Patient's other noncompliance with medication regimen for other reason: Secondary | ICD-10-CM

## 2021-11-15 DIAGNOSIS — E1122 Type 2 diabetes mellitus with diabetic chronic kidney disease: Secondary | ICD-10-CM | POA: Diagnosis present

## 2021-11-15 DIAGNOSIS — M6281 Muscle weakness (generalized): Secondary | ICD-10-CM | POA: Diagnosis present

## 2021-11-15 DIAGNOSIS — I10 Essential (primary) hypertension: Secondary | ICD-10-CM | POA: Diagnosis present

## 2021-11-15 DIAGNOSIS — B1920 Unspecified viral hepatitis C without hepatic coma: Secondary | ICD-10-CM | POA: Diagnosis present

## 2021-11-15 DIAGNOSIS — Z23 Encounter for immunization: Secondary | ICD-10-CM

## 2021-11-15 DIAGNOSIS — I129 Hypertensive chronic kidney disease with stage 1 through stage 4 chronic kidney disease, or unspecified chronic kidney disease: Secondary | ICD-10-CM | POA: Diagnosis present

## 2021-11-15 DIAGNOSIS — F1411 Cocaine abuse, in remission: Secondary | ICD-10-CM | POA: Diagnosis present

## 2021-11-15 DIAGNOSIS — Z833 Family history of diabetes mellitus: Secondary | ICD-10-CM

## 2021-11-15 DIAGNOSIS — M898X9 Other specified disorders of bone, unspecified site: Secondary | ICD-10-CM | POA: Diagnosis present

## 2021-11-15 DIAGNOSIS — E872 Acidosis, unspecified: Secondary | ICD-10-CM

## 2021-11-15 DIAGNOSIS — M549 Dorsalgia, unspecified: Secondary | ICD-10-CM | POA: Diagnosis present

## 2021-11-15 DIAGNOSIS — M79604 Pain in right leg: Secondary | ICD-10-CM | POA: Diagnosis present

## 2021-11-15 DIAGNOSIS — F419 Anxiety disorder, unspecified: Secondary | ICD-10-CM | POA: Diagnosis present

## 2021-11-15 DIAGNOSIS — Z8711 Personal history of peptic ulcer disease: Secondary | ICD-10-CM

## 2021-11-15 DIAGNOSIS — E8729 Other acidosis: Secondary | ICD-10-CM | POA: Diagnosis present

## 2021-11-15 DIAGNOSIS — R7989 Other specified abnormal findings of blood chemistry: Secondary | ICD-10-CM | POA: Diagnosis present

## 2021-11-15 DIAGNOSIS — Z87442 Personal history of urinary calculi: Secondary | ICD-10-CM

## 2021-11-15 DIAGNOSIS — F1290 Cannabis use, unspecified, uncomplicated: Secondary | ICD-10-CM | POA: Diagnosis present

## 2021-11-15 DIAGNOSIS — K2970 Gastritis, unspecified, without bleeding: Secondary | ICD-10-CM | POA: Diagnosis present

## 2021-11-15 DIAGNOSIS — R0603 Acute respiratory distress: Secondary | ICD-10-CM | POA: Diagnosis present

## 2021-11-15 DIAGNOSIS — J449 Chronic obstructive pulmonary disease, unspecified: Secondary | ICD-10-CM | POA: Diagnosis present

## 2021-11-15 LAB — COMPREHENSIVE METABOLIC PANEL
ALT: 10 U/L (ref 0–44)
AST: 22 U/L (ref 15–41)
Albumin: 2.6 g/dL — ABNORMAL LOW (ref 3.5–5.0)
Alkaline Phosphatase: 53 U/L (ref 38–126)
Anion gap: 8 (ref 5–15)
BUN: 40 mg/dL — ABNORMAL HIGH (ref 8–23)
CO2: 15 mmol/L — ABNORMAL LOW (ref 22–32)
Calcium: 7.7 mg/dL — ABNORMAL LOW (ref 8.9–10.3)
Chloride: 112 mmol/L — ABNORMAL HIGH (ref 98–111)
Creatinine, Ser: 4.15 mg/dL — ABNORMAL HIGH (ref 0.44–1.00)
GFR, Estimated: 12 mL/min — ABNORMAL LOW (ref >60)
Glucose, Bld: 115 mg/dL — ABNORMAL HIGH (ref 70–99)
Potassium: 4.1 mmol/L (ref 3.5–5.1)
Sodium: 135 mmol/L (ref 135–145)
Total Bilirubin: 0.4 mg/dL (ref 0.3–1.2)
Total Protein: 7.2 g/dL (ref 6.5–8.1)

## 2021-11-15 LAB — CBC WITH DIFFERENTIAL/PLATELET
Abs Immature Granulocytes: 0.03 10*3/uL (ref 0.00–0.07)
Basophils Absolute: 0 10*3/uL (ref 0.0–0.1)
Basophils Relative: 0 %
Eosinophils Absolute: 0.3 10*3/uL (ref 0.0–0.5)
Eosinophils Relative: 3 %
HCT: 15.1 % — ABNORMAL LOW (ref 36.0–46.0)
Hemoglobin: 4.5 g/dL — CL (ref 12.0–15.0)
Immature Granulocytes: 0 %
Lymphocytes Relative: 19 %
Lymphs Abs: 1.8 10*3/uL (ref 0.7–4.0)
MCH: 28.5 pg (ref 26.0–34.0)
MCHC: 29.8 g/dL — ABNORMAL LOW (ref 30.0–36.0)
MCV: 95.6 fL (ref 80.0–100.0)
Monocytes Absolute: 1.2 10*3/uL — ABNORMAL HIGH (ref 0.1–1.0)
Monocytes Relative: 13 %
Neutro Abs: 5.9 10*3/uL (ref 1.7–7.7)
Neutrophils Relative %: 65 %
Platelets: 368 10*3/uL (ref 150–400)
RBC: 1.58 MIL/uL — ABNORMAL LOW (ref 3.87–5.11)
RDW: 16 % — ABNORMAL HIGH (ref 11.5–15.5)
WBC: 9.2 10*3/uL (ref 4.0–10.5)
nRBC: 0 % (ref 0.0–0.2)

## 2021-11-15 LAB — RESPIRATORY PANEL BY PCR

## 2021-11-15 LAB — BLOOD GAS, ARTERIAL
Acid-base deficit: 12.4 mmol/L — ABNORMAL HIGH (ref 0.0–2.0)
Bicarbonate: 12.7 mmol/L — ABNORMAL LOW (ref 20.0–28.0)
Drawn by: 54496
O2 Content: 4 L/min
O2 Saturation: 99 %
Patient temperature: 37
pCO2 arterial: 27 mmHg — ABNORMAL LOW (ref 32–48)
pH, Arterial: 7.28 — ABNORMAL LOW (ref 7.35–7.45)
pO2, Arterial: 121 mmHg — ABNORMAL HIGH (ref 83–108)

## 2021-11-15 LAB — TROPONIN I (HIGH SENSITIVITY)
Troponin I (High Sensitivity): 45 ng/L — ABNORMAL HIGH (ref <18)
Troponin I (High Sensitivity): 57 ng/L — ABNORMAL HIGH (ref <18)
Troponin I (High Sensitivity): 68 ng/L — ABNORMAL HIGH (ref <18)
Troponin I (High Sensitivity): 70 ng/L — ABNORMAL HIGH (ref <18)

## 2021-11-15 LAB — IRON AND TIBC
Iron: 14 ug/dL — ABNORMAL LOW (ref 28–170)
Saturation Ratios: 3 % — ABNORMAL LOW (ref 10.4–31.8)
TIBC: 449 ug/dL (ref 250–450)
UIBC: 435 ug/dL

## 2021-11-15 LAB — ECHOCARDIOGRAM COMPLETE
Area-P 1/2: 3.21 cm2
Calc EF: 61.3 %
Height: 67 in
S' Lateral: 4.2 cm
Single Plane A2C EF: 59.8 %
Single Plane A4C EF: 62.5 %
Weight: 2042.34 oz

## 2021-11-15 LAB — AMMONIA: Ammonia: 11 umol/L (ref 9–35)

## 2021-11-15 LAB — PREPARE RBC (CROSSMATCH)

## 2021-11-15 LAB — ETHANOL: Alcohol, Ethyl (B): <10 mg/dL (ref <10)

## 2021-11-15 LAB — SARS CORONAVIRUS 2 BY RT PCR: SARS Coronavirus 2 by RT PCR: NEGATIVE

## 2021-11-15 LAB — HEMOGLOBIN AND HEMATOCRIT, BLOOD
HCT: 18.6 % — ABNORMAL LOW (ref 36.0–46.0)
HCT: 20.5 % — ABNORMAL LOW (ref 36.0–46.0)
Hemoglobin: 5.8 g/dL — CL (ref 12.0–15.0)
Hemoglobin: 6.6 g/dL — CL (ref 12.0–15.0)

## 2021-11-15 LAB — BRAIN NATRIURETIC PEPTIDE: B Natriuretic Peptide: 356.6 pg/mL — ABNORMAL HIGH (ref 0.0–100.0)

## 2021-11-15 LAB — PROCALCITONIN: Procalcitonin: 0.31 ng/mL

## 2021-11-15 LAB — FERRITIN: Ferritin: 11 ng/mL (ref 11–307)

## 2021-11-15 LAB — LACTIC ACID, PLASMA: Lactic Acid, Venous: 1.2 mmol/L (ref 0.5–1.9)

## 2021-11-15 LAB — HIV ANTIBODY (ROUTINE TESTING W REFLEX): HIV Screen 4th Generation wRfx: NONREACTIVE

## 2021-11-15 MED ORDER — FOLIC ACID 5 MG/ML IJ SOLN
1.0000 mg | Freq: Every day | INTRAMUSCULAR | Status: DC
Start: 2021-11-15 — End: 2021-11-16
  Administered 2021-11-15: 1 mg via INTRAVENOUS
  Filled 2021-11-15 (×2): qty 0.2

## 2021-11-15 MED ORDER — SODIUM CHLORIDE 0.9 % IV SOLN
2.0000 g | INTRAVENOUS | Status: DC
  Administered 2021-11-15: 2 g via INTRAVENOUS
  Filled 2021-11-15: qty 20

## 2021-11-15 MED ORDER — STERILE WATER FOR INJECTION IV SOLN
Freq: Once | INTRAVENOUS | Status: AC
  Filled 2021-11-15: qty 150

## 2021-11-15 MED ORDER — HYDROCODONE-ACETAMINOPHEN 7.5-325 MG PO TABS
1.0000 | ORAL_TABLET | Freq: Four times a day (QID) | ORAL | Status: DC | PRN
  Administered 2021-11-15 – 2021-11-18 (×6): 1 via ORAL
  Filled 2021-11-15 (×6): qty 1

## 2021-11-15 MED ORDER — PANTOPRAZOLE SODIUM 40 MG IV SOLR
40.0000 mg | INTRAVENOUS | Status: DC
  Administered 2021-11-15: 40 mg via INTRAVENOUS
  Filled 2021-11-15: qty 10

## 2021-11-15 MED ORDER — PANTOPRAZOLE SODIUM 40 MG IV SOLR
40.0000 mg | Freq: Two times a day (BID) | INTRAVENOUS | Status: DC
  Administered 2021-11-15 – 2021-11-18 (×6): 40 mg via INTRAVENOUS
  Filled 2021-11-15 (×6): qty 10

## 2021-11-15 MED ORDER — SODIUM BICARBONATE 650 MG PO TABS
1300.0000 mg | ORAL_TABLET | Freq: Two times a day (BID) | ORAL | Status: DC
  Administered 2021-11-15 – 2021-11-16 (×2): 1300 mg via ORAL
  Filled 2021-11-15 (×2): qty 2

## 2021-11-15 MED ORDER — METHYLPREDNISOLONE SODIUM SUCC 40 MG IJ SOLR
40.0000 mg | Freq: Two times a day (BID) | INTRAMUSCULAR | Status: AC
  Administered 2021-11-15 – 2021-11-16 (×2): 40 mg via INTRAVENOUS
  Filled 2021-11-15 (×2): qty 1

## 2021-11-15 MED ORDER — SODIUM CHLORIDE 0.9 % IV SOLN
500.0000 mg | INTRAVENOUS | Status: DC
  Administered 2021-11-15 – 2021-11-16 (×2): 500 mg via INTRAVENOUS
  Filled 2021-11-15 (×3): qty 5

## 2021-11-15 MED ORDER — ADULT MULTIVITAMIN W/MINERALS CH
1.0000 | ORAL_TABLET | Freq: Every day | ORAL | Status: DC
  Administered 2021-11-15 – 2021-11-18 (×4): 1 via ORAL
  Filled 2021-11-15 (×4): qty 1

## 2021-11-15 MED ORDER — IPRATROPIUM-ALBUTEROL 0.5-2.5 (3) MG/3ML IN SOLN
3.0000 mL | Freq: Four times a day (QID) | RESPIRATORY_TRACT | Status: DC
  Administered 2021-11-15 (×3): 3 mL via RESPIRATORY_TRACT
  Filled 2021-11-15 (×3): qty 3

## 2021-11-15 MED ORDER — LORAZEPAM 2 MG/ML IJ SOLN
1.0000 mg | Freq: Once | INTRAMUSCULAR | Status: AC
  Administered 2021-11-15: 1 mg via INTRAVENOUS
  Filled 2021-11-15: qty 1

## 2021-11-15 MED ORDER — METHYLPREDNISOLONE SODIUM SUCC 125 MG IJ SOLR
125.0000 mg | Freq: Once | INTRAMUSCULAR | Status: AC
  Administered 2021-11-15: 125 mg via INTRAVENOUS
  Filled 2021-11-15: qty 2

## 2021-11-15 MED ORDER — THIAMINE HCL 100 MG/ML IJ SOLN
100.0000 mg | Freq: Every day | INTRAMUSCULAR | Status: DC
  Administered 2021-11-15: 100 mg via INTRAVENOUS
  Filled 2021-11-15: qty 2

## 2021-11-15 MED ORDER — PERFLUTREN LIPID MICROSPHERE
1.0000 mL | INTRAVENOUS | Status: AC | PRN
  Administered 2021-11-15: 4 mL via INTRAVENOUS
  Filled 2021-11-15: qty 10

## 2021-11-15 MED ORDER — PNEUMOCOCCAL 20-VAL CONJ VACC 0.5 ML IM SUSY
0.5000 mL | PREFILLED_SYRINGE | INTRAMUSCULAR | Status: AC
  Administered 2021-11-16: 0.5 mL via INTRAMUSCULAR
  Filled 2021-11-15: qty 0.5

## 2021-11-15 MED ORDER — SODIUM CHLORIDE 0.9 % IV SOLN: 10.0000 mL/h | Freq: Once | INTRAVENOUS | Status: DC

## 2021-11-15 MED ORDER — PREDNISONE 20 MG PO TABS
40.0000 mg | ORAL_TABLET | Freq: Every day | ORAL | Status: DC
  Administered 2021-11-16 – 2021-11-17 (×2): 40 mg via ORAL
  Filled 2021-11-15 (×2): qty 2
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
## 2021-11-15 NOTE — Progress Notes (Signed)
The patient arrived to the unit. She is alert, oriented x 3. She is experiencing tachypnea. VS are stable, pt is appears drowsy but easy to arouse. She complains of generalized pain.

## 2021-11-15 NOTE — H&P (Addendum)
History and Physical    PatientJALON [name] ZRA:[id number] DOB: 03/31/1960 DOA: 11/15/2021 DOS: the patient was seen and examined on 11/15/2021 PCP: Ladell Pier, MD  Patient coming from: Home  Chief Complaint:  Chief Complaint  Patient presents with   Respiratory Distress   HPI: [name] is a 61 y.o. female with medical history significant of CKD4, chronic hep C, GERD, duodenal ulcer, HTN, COPD. Presenting with dyspnea. She reports that she has had shortness of breath for the last several days. She has had non-productive cough during this time. She tried her inhaler at home, but it has not helped. She has not had any sick contacts that she is aware of. She has not had any chest pain. She does reports some intermittent dark stools. When her symptoms worsened this morning. She decided to come to the ED for help. She denies any other aggravating or alleviating factors.   Review of Systems: As mentioned in the history of present illness. All other systems reviewed and are negative. Past Medical History:  Diagnosis Date   Alcohol abuse    Allergy    Anxiety    Arthritis    Asthma    Cannabis abuse    Cocaine abuse (HCC)    COPD (chronic obstructive pulmonary disease) (HCC)    Depression    Drug addiction ([place])    GERD (gastroesophageal reflux disease)    Heart murmur    Homelessness    Hyperlipidemia    Hypertension    pt stated "every once in a while BP will be high but has not been prescribed medication for HTN.    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) ([place]) 02/22/2016   Past Surgical History:  Procedure Laterality Date   BIOPSY  01/01/2019   Procedure: BIOPSY;  Surgeon: Jerene Bears, MD;  Location: [facility] ENDOSCOPY;  Service: Endoscopy;;   [place]  11/07/2020   2018   CYSTOSCOPY W/ URETERAL STENT PLACEMENT  Left 08/01/2018   Procedure: CYSTOSCOPY WITH RETROGRADE PYELOGRAM/URETERAL STENT PLACEMENT;  Surgeon: Lucas Mallow, MD;  Location: [facility] ORS;  Service: Urology;  Laterality: Left;   CYSTOSCOPY WITH RETROGRADE PYELOGRAM, URETEROSCOPY AND STENT PLACEMENT Left 01/15/2019   Procedure: CYSTOSCOPY WITH RETROGRADE PYELOGRAM, URETEROSCOPY AND STENT PLACEMENT;  Surgeon: Lucas Mallow, MD;  Location: [facility] ORS;  Service: Urology;  Laterality: Left;   ESOPHAGOGASTRODUODENOSCOPY (EGD) WITH PROPOFOL N/A 01/01/2019   Procedure: ESOPHAGOGASTRODUODENOSCOPY (EGD) WITH PROPOFOL;  Surgeon: Jerene Bears, MD;  Location: [place] ENDOSCOPY;  Service: Endoscopy;  Laterality: N/A;   ESOPHAGOGASTRODUODENOSCOPY (EGD) WITH PROPOFOL N/A 08/21/2019   Procedure: ESOPHAGOGASTRODUODENOSCOPY (EGD) WITH PROPOFOL;  Surgeon: Mauri Pole, MD;  Location: [place] ENDOSCOPY;  Service: Endoscopy;  Laterality: N/A;   FRACTURE SURGERY Left 2011   arm   UPPER GASTROINTESTINAL ENDOSCOPY     Social History:  reports that she has been smoking cigarettes. She has been smoking an average of 1.00 packs per day. She has never used smokeless tobacco. She reports that she does not currently use alcohol. She reports current drug use. Drug: Marijuana.  Allergies  Allergen Reactions   Zestril [Lisinopril] Other (See Comments)    Hyperkalemia    Family History  Problem Relation Age of Onset   Diabetes Father    Colon cancer Neg Hx  Esophageal cancer Neg Hx    Rectal cancer Neg Hx    Stomach cancer Neg Hx     Prior to Admission medications   Medication Sig Start Date End Date Taking? Authorizing Provider  albuterol (VENTOLIN HFA) 108 (90 Base) MCG/ACT inhaler INHALE 2 PUFFS INTO THE LUNGS EVERY 6 HOURS AS NEEDED FOR WHEEZING OR SHORTNESS OF BREATH. 12/26/20   Ladell Pier, MD  amLODipine (NORVASC) 5 MG tablet Take 1 tablet (5 mg total) by mouth daily. 12/26/20   Ladell Pier, MD  atorvastatin (LIPITOR) 10 MG tablet TAKE 0.5  TABLETS (5 MG TOTAL) BY MOUTH DAILY. Patient not taking: Reported on 11/07/2020 07/16/19 07/15/20  Ladell Pier, MD  famotidine (PEPCID) 20 MG tablet Take 1 tablet (20 mg total) by mouth 2 (two) times daily. 12/26/20   Ladell Pier, MD  ferrous sulfate 325 (65 FE) MG tablet Take 1 tablet (325 mg total) by mouth daily with breakfast. 06/29/20   Ladell Pier, MD  gabapentin (NEURONTIN) 100 MG capsule Take 1 capsule (100 mg total) by mouth 3 (three) times daily. 12/26/20   Ladell Pier, MD  Multiple Vitamin (MULTIVITAMIN WITH MINERALS) TABS tablet Take 1 tablet by mouth daily. 06/29/20   Ladell Pier, MD  sodium bicarbonate 650 MG tablet Take 1 tablet (650 mg total) by mouth 2 (two) times daily. Patient not taking: Reported on 11/07/2020 06/30/20   Ladell Pier, MD  traZODone (DESYREL) 50 MG tablet Take 1 tablet (50 mg total) by mouth at bedtime as needed for sleep. 12/26/20   Ladell Pier, MD    Physical Exam: Vitals:   11/15/21 0845 11/15/21 0900 11/15/21 0915 11/15/21 0918  BP: 113/64 115/71 104/66 119/85  Pulse: 95 95 92 94  Resp: _0 Temp:   98.1 F (36.7 C) 98.1 F (36.7 C)  TempSrc:   Oral Oral  SpO2: 96% 95% 96% 97%  Weight:      Height:       General: 61 y.o. female resting in bed in NAD Eyes: PERRL, normal sclera ENMT: Nares patent w/o discharge, orophaynx clear, dentition normal, ears w/o discharge/lesions/ulcers Neck: Supple, trachea midline Cardiovascular: RRR, +S1, S2, no m/g/r, equal pulses throughout Respiratory: CTABL, no w/r/r, normal WOB GI: BS+, NDNT, no masses noted, no organomegaly noted MSK: No e/c/c Neuro: A&O x 3, no focal deficits Psyc: Appropriate interaction and affect, calm/cooperative  Data Reviewed:  Lab Results  Component Value Date   NA 135 11/15/2021   K 4.1 11/15/2021   CO2 15 (L) 11/15/2021   GLUCOSE 115 (H) 11/15/2021   BUN 40 (H) 11/15/2021   CREATININE 4.15 (H) 11/15/2021   CALCIUM 7.7 (L)  11/15/2021   EGFR 11 (L) 06/29/2020   GFRNONAA 12 (L) 11/15/2021   Lab Results  Component Value Date   WBC 9.2 11/15/2021   HGB 4.5 (LL) 11/15/2021   HCT 15.1 (L) 11/15/2021   MCV 95.6 11/15/2021   PLT 368 11/15/2021    CXR: Symmetric airway thickening or vascular congestion  EKG: sinus tach, no st elevations  Assessment and Plan: Symptomatic anemia ?GIB     - admit to inpt, progressive     - 3 units pRBCs ordered, trend H&H, additional transfusions as necessary     - LBGI consulted, appreciate assistance     - PPI IV BID  Respiratory distress ?COPD exacerbation     - question if anemia playing a role here as well, but  apparently she initially responded to nebs and steroids     - CXR as above, check CT chest     - COVID is negative     - continue steroids, nebs for now     - add IS  Elevated BNP Elevated troponin     - EKG is non-ischemic     - she denies chest pain     - trend trp (45 -> 57)     - she has chronically elevated BNP, but it's a little more than her baseline     - there is no peripheral edema; she actually appears dry     - CXR as above     - checking CT chest and echo     - holding lasix for now  Hx of chronic hep C     - check ammonia level, LFTs ok  AKI on CKD4     - baseline is somewhere between 3.3 - 4.3     - she's 4.15 today     - she appears a little dry, will give sodium bicarb infusion gently     - check renal US  Metabolic acidosis     - normal gap     - lactic acid is normal     - EtOH pending     - might be baseline renal function     - add bicarb     - sidebarred nephro on this  Hypocalcemia     - mild, corrects to 8.8; follow  HLD     - continue home regimen when confirmed  HTN     - continue home regimen when confirmed  UPDATE:  CAP     - CT chest shows possible in LLL PNA     - COVID negative; check RVP, procal     - start CAP coverage  Advance Care Planning:   Code Status: FULL  Consults: LBGI, sidebarred  nephrology  Family Communication: None at bedside  Severity of Illness: The appropriate patient status for this patient is INPATIENT. Inpatient status is judged to be reasonable and necessary in order to provide the required intensity of service to ensure the patient's safety. The patient's presenting symptoms, physical exam findings, and initial radiographic and laboratory data in the context of their chronic comorbidities is felt to place them at high risk for further clinical deterioration. Furthermore, it is not anticipated that the patient will be medically stable for discharge from the hospital within 2 midnights of admission.   * I certify that at the point of admission it is my clinical judgment that the patient will require inpatient hospital care spanning beyond 2 midnights from the point of admission due to high intensity of service, high risk for further deterioration and high frequency of surveillance required.*  Time spent in coordination of this H&P: 71 minutes  Author: Jonnie Finner, DO 11/15/2021 10:03 AM  For on call review [email or web address].

## 2021-11-15 NOTE — Consult Note (Signed)
[facility] Renal Consultation Note  Requesting MD: Cherylann Ratel, DO Indication for Consultation:  CKD   Chief complaint: shortness of breath   HPI:  [name] is a 61 y.o. female with a history of advanced CKD, nephrolithiasis s/p lithotripsy, COPD, cocaine abuse, ETOH abuse, HTN, DM, and prior homelessness who presented to the hospital with shortness of breath.  She states "I just couldn't breathe".  She is not normally on any oxygen and has been on 5 liters here.  She has had progressive CKD over the past several years as noted below.  Cr 4.15 on presentation today.  Nephrology is consulted for assistance with management of CKD.  Note that she was also found to have profound anemia.  States she has gotten a unit of blood (three units are ordered).  Per nursing she still needs one unit of those three.  She was previously seen in our office on two occasions and last saw Dr. Hollie Salk in 01/2020 but has been lost to follow-up after noncompliance with multiple appointments.  Multiple chart notes also indicate difficulty getting in touch with the patient.  She states that she has struggled with homelessness.  She is currently living in a location through a friend at the [employer] however her story varies at times.  She states that she stopped alcohol and cocaine "a [place] time ago" but later clarifies that she stopped them both about a month ago.  Hasn't been on medications in several months.  She worries about how she would get to and from dialysis but states "what choice would I have?" when asked about dialysis.   Creatinine, Ser  Date/Time Value Ref Range Status  11/15/2021 05:28 AM 4.15 (H) 0.44 - 1.00 mg/dL Final  08/02/2020 04:09 PM 3.34 (H) 0.44 - 1.00 mg/dL Final  06/29/2020 04:14 PM 4.32 (H) 0.57 - 1.00 mg/dL Final  01/01/2020 05:32 AM 2.62 (H) 0.44 - 1.00 mg/dL Final  12/31/2019 06:58 PM 2.89 (H) 0.44 - 1.00 mg/dL Final  08/31/2019 12:30 PM 1.76 (H) 0.57 - 1.00 mg/dL Final   08/23/2019 04:59 AM 2.85 (H) 0.44 - 1.00 mg/dL Final  08/22/2019 06:18 AM 2.65 (H) 0.44 - 1.00 mg/dL Final  08/21/2019 09:11 AM 2.72 (H) 0.44 - 1.00 mg/dL Final  08/20/2019 05:53 PM 2.56 (H) 0.44 - 1.00 mg/dL Final  08/20/2019 07:27 AM 2.46 (H) 0.44 - 1.00 mg/dL Final  08/20/2019 04:10 AM 2.36 (H) 0.44 - 1.00 mg/dL Final  08/19/2019 11:16 PM 2.43 (H) 0.44 - 1.00 mg/dL Final  08/19/2019 08:58 PM 2.47 (H) 0.44 - 1.00 mg/dL Final  08/19/2019 02:32 PM 2.60 (H) 0.44 - 1.00 mg/dL Final  08/19/2019 12:16 PM 2.33 (H) 0.44 - 1.00 mg/dL Final  07/16/2019 11:47 AM 2.68 (H) 0.57 - 1.00 mg/dL Final  01/15/2019 03:46 AM 2.41 (H) 0.44 - 1.00 mg/dL Final  01/14/2019 04:16 AM 2.48 (H) 0.44 - 1.00 mg/dL Final  01/13/2019 03:59 AM 2.74 (H) 0.44 - 1.00 mg/dL Final  01/12/2019 07:17 PM 2.82 (H) 0.44 - 1.00 mg/dL Final  01/12/2019 02:14 AM 3.28 (H) 0.44 - 1.00 mg/dL Final  01/01/2019 03:33 AM 2.51 (H) 0.44 - 1.00 mg/dL Final  12/31/2018 02:51 PM 2.67 (H) 0.44 - 1.00 mg/dL Final  08/04/2018 05:34 AM 2.18 (H) 0.44 - 1.00 mg/dL Final  08/03/2018 03:20 AM 2.14 (H) 0.44 - 1.00 mg/dL Final  08/02/2018 03:56 AM 2.47 (H) 0.44 - 1.00 mg/dL Final  08/01/2018 05:21 AM 2.69 (H) 0.44 - 1.00 mg/dL Final  07/31/2018  12:56 PM 2.29 (H) 0.44 - 1.00 mg/dL Final  10/29/2017 04:50 PM 1.49 (H) 0.44 - 1.00 mg/dL Final  09/11/2017 10:33 AM 1.47 (H) 0.57 - 1.00 mg/dL Final  04/25/2017 04:00 PM 1.72 (H) 0.57 - 1.00 mg/dL Final  06/07/2016 10:44 AM 1.41 (H) 0.44 - 1.00 mg/dL Final  10/08/2015 03:00 AM 1.25 (H) 0.44 - 1.00 mg/dL Final  05/04/2014 06:14 PM 0.93 0.50 - 1.10 mg/dL Final  10/29/2012 10:50 AM 0.52 0.50 - 1.10 mg/dL Final  10/28/2012 04:25 PM 0.70 0.50 - 1.10 mg/dL Final  09/15/2012 11:50 PM 0.77 0.50 - 1.10 mg/dL Final  05/02/2012 01:42 PM 0.94 0.50 - 1.10 mg/dL Final  03/02/2010 08:53 PM 0.90 0.4 - 1.2 mg/dL Final  01/25/2008 08:55 AM 1.2  Final     PMHx:   Past Medical History:  Diagnosis Date   Alcohol  abuse    Allergy    Anxiety    Arthritis    Asthma    Cannabis abuse    Cocaine abuse (HCC)    COPD (chronic obstructive pulmonary disease) (HCC)    Depression    Drug addiction ([place])    GERD (gastroesophageal reflux disease)    Heart murmur    Homelessness    Hyperlipidemia    Hypertension    pt stated "every once in a while BP will be high but has not been prescribed medication for HTN.    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) ([place]) 02/22/2016    Past Surgical History:  Procedure Laterality Date   BIOPSY  01/01/2019   Procedure: BIOPSY;  Surgeon: Jerene Bears, MD;  Location: [facility] ENDOSCOPY;  Service: Endoscopy;;   [place]  11/07/2020   2018   CYSTOSCOPY W/ URETERAL STENT PLACEMENT Left 08/01/2018   Procedure: CYSTOSCOPY WITH RETROGRADE PYELOGRAM/URETERAL STENT PLACEMENT;  Surgeon: Lucas Mallow, MD;  Location: [facility] ORS;  Service: Urology;  Laterality: Left;   CYSTOSCOPY WITH RETROGRADE PYELOGRAM, URETEROSCOPY AND STENT PLACEMENT Left 01/15/2019   Procedure: CYSTOSCOPY WITH RETROGRADE PYELOGRAM, URETEROSCOPY AND STENT PLACEMENT;  Surgeon: Lucas Mallow, MD;  Location: [facility] ORS;  Service: Urology;  Laterality: Left;   ESOPHAGOGASTRODUODENOSCOPY (EGD) WITH PROPOFOL N/A 01/01/2019   Procedure: ESOPHAGOGASTRODUODENOSCOPY (EGD) WITH PROPOFOL;  Surgeon: Jerene Bears, MD;  Location: [place] ENDOSCOPY;  Service: Endoscopy;  Laterality: N/A;   ESOPHAGOGASTRODUODENOSCOPY (EGD) WITH PROPOFOL N/A 08/21/2019   Procedure: ESOPHAGOGASTRODUODENOSCOPY (EGD) WITH PROPOFOL;  Surgeon: Mauri Pole, MD;  Location: [place] ENDOSCOPY;  Service: Endoscopy;  Laterality: N/A;   FRACTURE SURGERY Left 2011   arm   UPPER GASTROINTESTINAL ENDOSCOPY      Family Hx:  Family History  Problem Relation Age of Onset   Diabetes Father    Colon cancer  Neg Hx    Esophageal cancer Neg Hx    Rectal cancer Neg Hx    Stomach cancer Neg Hx   No family history of CKD or ESRD  Social History:  reports that she has been smoking cigarettes. She has been smoking an average of 1.00 packs per day. She has never used smokeless tobacco. She reports that she does not currently use alcohol. She reports current drug use. Drug: Marijuana.  Allergies:  Allergies  Allergen Reactions   Zestril [Lisinopril] Other (See Comments)    Hyperkalemia    Medications: Prior  to Admission medications   Medication Sig Start Date End Date Taking? Authorizing Provider  albuterol (VENTOLIN HFA) 108 (90 Base) MCG/ACT inhaler INHALE 2 PUFFS INTO THE LUNGS EVERY 6 HOURS AS NEEDED FOR WHEEZING OR SHORTNESS OF BREATH. 12/26/20   Ladell Pier, MD  amLODipine (NORVASC) 5 MG tablet Take 1 tablet (5 mg total) by mouth daily. 12/26/20   Ladell Pier, MD  atorvastatin (LIPITOR) 10 MG tablet TAKE 0.5 TABLETS (5 MG TOTAL) BY MOUTH DAILY. Patient not taking: Reported on 11/07/2020 07/16/19 07/15/20  Ladell Pier, MD  famotidine (PEPCID) 20 MG tablet Take 1 tablet (20 mg total) by mouth 2 (two) times daily. 12/26/20   Ladell Pier, MD  ferrous sulfate 325 (65 FE) MG tablet Take 1 tablet (325 mg total) by mouth daily with breakfast. 06/29/20   Ladell Pier, MD  gabapentin (NEURONTIN) 100 MG capsule Take 1 capsule (100 mg total) by mouth 3 (three) times daily. 12/26/20   Ladell Pier, MD  Multiple Vitamin (MULTIVITAMIN WITH MINERALS) TABS tablet Take 1 tablet by mouth daily. 06/29/20   Ladell Pier, MD  sodium bicarbonate 650 MG tablet Take 1 tablet (650 mg total) by mouth 2 (two) times daily. Patient not taking: Reported on 11/07/2020 06/30/20   Ladell Pier, MD  traZODone (DESYREL) 50 MG tablet Take 1 tablet (50 mg total) by mouth at bedtime as needed for sleep. 12/26/20   Ladell Pier, MD    I have reviewed the patient's current and  reported prior to admission medications.  Labs:     Latest Ref Rng & Units 11/15/2021    5:28 AM 08/02/2020    4:09 PM 06/29/2020    4:14 PM  BMP  Glucose 70 - 99 mg/dL 115  106  78   BUN 8 - 23 mg/dL 40  31  47   Creatinine 0.44 - 1.00 mg/dL 4.15  3.34  4.32   BUN/Creat Ratio 9 - 23   11   Sodium 135 - 145 mmol/L 135  130  134   Potassium 3.5 - 5.1 mmol/L 4.1  3.8  3.9   Chloride 98 - 111 mmol/L 112  102  97   CO2 22 - 32 mmol/L '15  14  11   '$ Calcium 8.9 - 10.3 mg/dL 7.7  8.5  8.8     Urinalysis    Component Value Date/Time   COLORURINE YELLOW 08/19/2019 1629   APPEARANCEUR HAZY (A) 08/19/2019 1629   LABSPEC 1.002 (L) 08/19/2019 1629   PHURINE 6.0 08/19/2019 1629   GLUCOSEU NEGATIVE 08/19/2019 1629   HGBUR SMALL (A) 08/19/2019 1629   BILIRUBINUR NEGATIVE 08/19/2019 1629   KETONESUR NEGATIVE 08/19/2019 1629   PROTEINUR 100 (A) 08/19/2019 1629   UROBILINOGEN 0.2 03/02/2010 2027   NITRITE POSITIVE (A) 08/19/2019 1629   LEUKOCYTESUR SMALL (A) 08/19/2019 1629     ROS:  Pertinent items noted in HPI and remainder of comprehensive ROS otherwise negative.  Physical Exam: Vitals:   11/15/21 1225 11/15/21 1339  BP: 128/66 130/66  Pulse: 84 85  Resp: 19 (!) 24  Temp: 98.3 F (36.8 C) 97.7 F (36.5 C)  SpO2: 98% 100%     General: adult female appears older than stated age   HEENT: NCAT Eyes: EOMI sclera anicteric Neck: supple trachea midline  Heart: S1S2 no rub Lungs: occ cough and reduced breath sounds; on 5 liters oxygen; unlabored at rest Abdomen: soft/nt/nd Extremities: no edema appreciated  Skin: no  rash on extremities exposed Neuro: alert and oriented x 3 provides basic history and follows commands   Psych appropriate mood and affect, tearful at times  Assessment/Plan:  # CKD stage V - She has had progressive CKD  - No emergent indication for dialysis  - note she had an abd [place] earlier this afternoon - no read yet   # Anemia CKD - s/p 2 units thus far and  one additional unit was also ordered (for total of 3) - CKD contributing  - Would rule out other etiologies of blood loss per primary team   # Metabolic acidosis  - would resume sodium bicarbonate. Noncompliant with home meds.   # HTN  - Controlled on current regimen   # Metabolic bone disease - Hypocalcemia improves with correction for albumin  - Check phos and intact PTH (orders placed)  # Medical noncompliance - noncompliance has complicated her care and I worry will push her to ESRD at some point soon  Claudia Desanctis 11/15/2021, 6:16 PM

## 2021-11-15 NOTE — ED Provider Notes (Signed)
[facility] DEPT Provider Note   CSN: [id number] Arrival date & time: 11/15/21  0520     History  Chief Complaint  Patient presents with   Respiratory Distress    [name] is a 61 y.o. female.  Patient is a 61 year old female with past medical history of COPD, GERD, hypertension, hyperlipidemia, chronic renal insufficiency.  Patient presenting today for evaluation of shortness of breath.  This has been ongoing for the past several days, but became much worse this morning.  She describes some cough which is nonproductive, but no fever.  She was felt to be in moderate respiratory distress by EMS.  She was transported here after receiving 2 DuoNeb's in route.  The history is provided by the patient.       Home Medications Prior to Admission medications   Medication Sig Start Date End Date Taking? Authorizing Provider  albuterol (VENTOLIN HFA) 108 (90 Base) MCG/ACT inhaler INHALE 2 PUFFS INTO THE LUNGS EVERY 6 HOURS AS NEEDED FOR WHEEZING OR SHORTNESS OF BREATH. 12/26/20   Ladell Pier, MD  amLODipine (NORVASC) 5 MG tablet Take 1 tablet (5 mg total) by mouth daily. 12/26/20   Ladell Pier, MD  atorvastatin (LIPITOR) 10 MG tablet TAKE 0.5 TABLETS (5 MG TOTAL) BY MOUTH DAILY. Patient not taking: Reported on 11/07/2020 07/16/19 07/15/20  Ladell Pier, MD  famotidine (PEPCID) 20 MG tablet Take 1 tablet (20 mg total) by mouth 2 (two) times daily. 12/26/20   Ladell Pier, MD  ferrous sulfate 325 (65 FE) MG tablet Take 1 tablet (325 mg total) by mouth daily with breakfast. 06/29/20   Ladell Pier, MD  gabapentin (NEURONTIN) 100 MG capsule Take 1 capsule (100 mg total) by mouth 3 (three) times daily. 12/26/20   Ladell Pier, MD  Multiple Vitamin (MULTIVITAMIN WITH MINERALS) TABS tablet Take 1 tablet by mouth daily. 06/29/20   Ladell Pier, MD  sodium bicarbonate 650 MG tablet Take 1 tablet (650 mg total) by mouth 2 (two)  times daily. Patient not taking: Reported on 11/07/2020 06/30/20   Ladell Pier, MD  traZODone (DESYREL) 50 MG tablet Take 1 tablet (50 mg total) by mouth at bedtime as needed for sleep. 12/26/20   Ladell Pier, MD      Allergies    Lisinopril    Review of Systems   Review of Systems  All other systems reviewed and are negative.   Physical Exam Updated Vital Signs BP (!) 146/81 (BP Location: Right Arm)   Pulse (!) 120   Temp 99.7 F (37.6 C) (Axillary)   Resp (!) 27   SpO2 100%  Physical Exam Vitals and nursing note reviewed.  Constitutional:      General: She is not in acute distress.    Appearance: She is well-developed. She is not diaphoretic.  HENT:     Head: Normocephalic and atraumatic.  Cardiovascular:     Rate and Rhythm: Normal rate and regular rhythm.     Heart sounds: No murmur heard.    No friction rub. No gallop.  Pulmonary:     Effort: Respiratory distress present.     Breath sounds: Rhonchi present.     Comments: Patient in moderate respiratory distress.  She is tachypneic with rhonchi throughout. Abdominal:     General: Bowel sounds are normal. There is no distension.     Palpations: Abdomen is soft.     Tenderness: There is no abdominal tenderness.  Musculoskeletal:        General: No swelling or tenderness. Normal range of motion.     Cervical back: Normal range of motion and neck supple.     Right lower leg: No edema.     Left lower leg: No edema.  Skin:    General: Skin is warm and dry.  Neurological:     General: No focal deficit present.     Mental Status: She is alert and oriented to person, place, and time.     ED Results / Procedures / Treatments   Labs (all labs ordered are listed, but only abnormal results are displayed) Labs Reviewed  SARS CORONAVIRUS 2 BY RT PCR  COMPREHENSIVE METABOLIC PANEL  CBC WITH DIFFERENTIAL/PLATELET  LACTIC ACID, PLASMA  LACTIC ACID, PLASMA  BRAIN NATRIURETIC PEPTIDE  TROPONIN I (HIGH  SENSITIVITY)    EKG None  Radiology No results found.  Procedures Procedures    Medications Ordered in ED Medications  methylPREDNISolone sodium succinate (SOLU-MEDROL) 125 mg/2 mL injection 125 mg (has no administration in time range)    ED Course/ Medical Decision Making/ A&P  This patient presents to the ED for concern of shortness of breath, this involves an extensive number of treatment options, and is a complaint that carries with it a high risk of complications and morbidity.  The differential diagnosis includes congestive heart failure, COPD exacerbation, symptomatic anemia, pneumothorax, PE   Co morbidities that complicate the patient evaluation  None   Additional history obtained:  No additional history or external records needed   Lab Tests:  I Ordered, and personally interpreted labs.  The pertinent results include: CBC showing hemoglobin of 4.5.  Metabolic panel shows worsening renal function with creatinine of 4.1.  COVID test is negative and laboratory studies otherwise unremarkable.   Imaging Studies ordered:  I ordered imaging studies including chest x-ray I independently visualized and interpreted imaging which showed symmetric airway thickening or vascular congestion, but no edema, consolidation, or pneumothorax. I agree with the radiologist interpretation   Cardiac Monitoring: / EKG:  The patient was maintained on a cardiac monitor.  I personally viewed and interpreted the cardiac monitored which showed an underlying rhythm of: Sinus tachycardia   Consultations Obtained:  I requested consultation with the hospitalist,  and discussed lab and imaging findings as well as pertinent plan - they recommend: Admit   Problem List / ED Course / Critical interventions / Medication management  Patient with extensive past medical history as per HPI presenting with complaints of shortness of breath.  She received 2 DuoNebs by EMS.  She arrived here in  moderate respiratory distress and appeared quite anxious.  She was given steroids as well as Ativan and seems to be doing somewhat better.  Work-up initiated including laboratory studies and chest x-ray.  Laboratory studies reveal a hemoglobin of 4.5.  Patient has history of chronic blood loss anemia and has had endoscopy and colonoscopy in the past, however no definitive cause has been found.  She is not experiencing any melena or bloody stools and given her vital signs I suspect a nonacute etiology. Patient to be transfused and admitted to the hospitalist service for transfusion, monitoring of hemoglobin, and management of what appears to be a COPD exacerbation. I have reviewed the patients home medicines and have made adjustments as needed   Social Determinants of Health:  None   Test / Admission - Considered:  Patient to be admitted due to COPD exacerbation/symptomatic anemia.  CRITICAL CARE  Performed by: Veryl Speak Total critical care time: 40 minutes Critical care time was exclusive of separately billable procedures and treating other patients. Critical care was necessary to treat or prevent imminent or life-threatening deterioration. Critical care was time spent personally by me on the following activities: development of treatment plan with patient and/or surrogate as well as nursing, discussions with consultants, evaluation of patient's response to treatment, examination of patient, obtaining history from patient or surrogate, ordering and performing treatments and interventions, ordering and review of laboratory studies, ordering and review of radiographic studies, pulse oximetry and re-evaluation of patient's condition.   Final Clinical Impression(s) / ED Diagnoses Final diagnoses:  None    Rx / DC Orders ED Discharge Orders     None         Veryl Speak, MD 11/15/21 [phone]

## 2021-11-15 NOTE — ED Triage Notes (Signed)
Pt from [facility], c/o SOB, hx of COPD, s/s x1 week, diminished left lung, wheezing right lung, wheezing improved w/ 2 albuterol treatments. Sats 94% on RA on arrival, tachycardiac, & tachypnea w/ unproductive cough. A&O x4. Stop smoking 1 week PTA.

## 2021-11-16 ENCOUNTER — Encounter (HOSPITAL_COMMUNITY): Payer: Self-pay | Admitting: Internal Medicine

## 2021-11-16 DIAGNOSIS — E872 Acidosis, unspecified: Secondary | ICD-10-CM

## 2021-11-16 DIAGNOSIS — J449 Chronic obstructive pulmonary disease, unspecified: Secondary | ICD-10-CM

## 2021-11-16 DIAGNOSIS — K254 Chronic or unspecified gastric ulcer with hemorrhage: Secondary | ICD-10-CM

## 2021-11-16 LAB — CBC
HCT: 23.9 % — ABNORMAL LOW (ref 36.0–46.0)
HCT: 26.1 % — ABNORMAL LOW (ref 36.0–46.0)
Hemoglobin: 7.6 g/dL — ABNORMAL LOW (ref 12.0–15.0)
Hemoglobin: 8.4 g/dL — ABNORMAL LOW (ref 12.0–15.0)
MCH: 28.7 pg (ref 26.0–34.0)
MCH: 28.7 pg (ref 26.0–34.0)
MCHC: 31.8 g/dL (ref 30.0–36.0)
MCHC: 32.2 g/dL (ref 30.0–36.0)
MCV: 89.1 fL (ref 80.0–100.0)
MCV: 90.2 fL (ref 80.0–100.0)
Platelets: 326 10*3/uL (ref 150–400)
Platelets: 328 10*3/uL (ref 150–400)
RBC: 2.65 MIL/uL — ABNORMAL LOW (ref 3.87–5.11)
RBC: 2.93 MIL/uL — ABNORMAL LOW (ref 3.87–5.11)
RDW: 15.2 % (ref 11.5–15.5)
RDW: 15.3 % (ref 11.5–15.5)
WBC: 9.2 10*3/uL (ref 4.0–10.5)
WBC: 9.2 10*3/uL (ref 4.0–10.5)
nRBC: 0 % (ref 0.0–0.2)
nRBC: 0 % (ref 0.0–0.2)

## 2021-11-16 LAB — TYPE AND SCREEN
ABO/RH(D): A POS
Antibody Screen: NEGATIVE
Unit division: 0
Unit division: 0
Unit division: 0

## 2021-11-16 LAB — CBC WITH DIFFERENTIAL/PLATELET
Abs Immature Granulocytes: 0.04 10*3/uL (ref 0.00–0.07)
Abs Immature Granulocytes: 0.07 10*3/uL (ref 0.00–0.07)
Basophils Absolute: 0 10*3/uL (ref 0.0–0.1)
Basophils Absolute: 0 10*3/uL (ref 0.0–0.1)
Basophils Relative: 0 %
Basophils Relative: 0 %
Eosinophils Absolute: 0 10*3/uL (ref 0.0–0.5)
Eosinophils Absolute: 0 10*3/uL (ref 0.0–0.5)
Eosinophils Relative: 0 %
Eosinophils Relative: 0 %
HCT: 26.7 % — ABNORMAL LOW (ref 36.0–46.0)
HCT: 24.4 % — ABNORMAL LOW (ref 36.0–46.0)
Hemoglobin: 8.7 g/dL — ABNORMAL LOW (ref 12.0–15.0)
Hemoglobin: 8.1 g/dL — ABNORMAL LOW (ref 12.0–15.0)
Immature Granulocytes: 0 %
Immature Granulocytes: 1 %
Lymphocytes Relative: 5 %
Lymphocytes Relative: 5 %
Lymphs Abs: 0.5 10*3/uL — ABNORMAL LOW (ref 0.7–4.0)
Lymphs Abs: 0.5 10*3/uL — ABNORMAL LOW (ref 0.7–4.0)
MCH: 29.1 pg (ref 26.0–34.0)
MCH: 29.6 pg (ref 26.0–34.0)
MCHC: 32.6 g/dL (ref 30.0–36.0)
MCHC: 33.2 g/dL (ref 30.0–36.0)
MCV: 89.3 fL (ref 80.0–100.0)
MCV: 89.1 fL (ref 80.0–100.0)
Monocytes Absolute: 0.3 10*3/uL (ref 0.1–1.0)
Monocytes Absolute: 0.6 10*3/uL (ref 0.1–1.0)
Monocytes Relative: 3 %
Monocytes Relative: 6 %
Neutro Abs: 9.2 10*3/uL — ABNORMAL HIGH (ref 1.7–7.7)
Neutro Abs: 8.5 10*3/uL — ABNORMAL HIGH (ref 1.7–7.7)
Neutrophils Relative %: 92 %
Neutrophils Relative %: 88 %
Platelets: 356 10*3/uL (ref 150–400)
Platelets: 352 10*3/uL (ref 150–400)
RBC: 2.99 MIL/uL — ABNORMAL LOW (ref 3.87–5.11)
RBC: 2.74 MIL/uL — ABNORMAL LOW (ref 3.87–5.11)
RDW: 15.1 % (ref 11.5–15.5)
RDW: 15.5 % (ref 11.5–15.5)
WBC: 10 10*3/uL (ref 4.0–10.5)
WBC: 9.6 10*3/uL (ref 4.0–10.5)
nRBC: 0 % (ref 0.0–0.2)
nRBC: 0 % (ref 0.0–0.2)

## 2021-11-16 LAB — COMPREHENSIVE METABOLIC PANEL
ALT: 11 U/L (ref 0–44)
AST: 21 U/L (ref 15–41)
Albumin: 2.7 g/dL — ABNORMAL LOW (ref 3.5–5.0)
Alkaline Phosphatase: 49 U/L (ref 38–126)
Anion gap: 15 (ref 5–15)
BUN: 43 mg/dL — ABNORMAL HIGH (ref 8–23)
CO2: 14 mmol/L — ABNORMAL LOW (ref 22–32)
Calcium: 8.1 mg/dL — ABNORMAL LOW (ref 8.9–10.3)
Chloride: 106 mmol/L (ref 98–111)
Creatinine, Ser: 4.46 mg/dL — ABNORMAL HIGH (ref 0.44–1.00)
GFR, Estimated: 11 mL/min — ABNORMAL LOW (ref >60)
Glucose, Bld: 113 mg/dL — ABNORMAL HIGH (ref 70–99)
Potassium: 4.6 mmol/L (ref 3.5–5.1)
Sodium: 135 mmol/L (ref 135–145)
Total Bilirubin: 0.4 mg/dL (ref 0.3–1.2)
Total Protein: 7.4 g/dL (ref 6.5–8.1)

## 2021-11-16 LAB — BPAM RBC
Blood Product Expiration Date: 202309242359
Blood Product Expiration Date: 202309242359
Blood Product Expiration Date: 202309242359
ISSUE DATE / TIME: 202308310723
ISSUE DATE / TIME: 202308311010
ISSUE DATE / TIME: 202308311845
Unit Type and Rh: 6200
Unit Type and Rh: 6200
Unit Type and Rh: 6200

## 2021-11-16 LAB — PHOSPHORUS: Phosphorus: 6.2 mg/dL — ABNORMAL HIGH (ref 2.5–4.6)

## 2021-11-16 MED ORDER — IPRATROPIUM-ALBUTEROL 0.5-2.5 (3) MG/3ML IN SOLN
3.0000 mL | Freq: Three times a day (TID) | RESPIRATORY_TRACT | Status: DC
  Administered 2021-11-16 – 2021-11-18 (×7): 3 mL via RESPIRATORY_TRACT
  Filled 2021-11-16 (×7): qty 3

## 2021-11-16 MED ORDER — SODIUM CHLORIDE 0.9 % IV SOLN
1.0000 g | INTRAVENOUS | Status: DC
  Administered 2021-11-16: 1 g via INTRAVENOUS
  Filled 2021-11-16 (×2): qty 10

## 2021-11-16 MED ORDER — FOLIC ACID 1 MG PO TABS
1.0000 mg | ORAL_TABLET | Freq: Every day | ORAL | Status: DC
  Administered 2021-11-16 – 2021-11-18 (×3): 1 mg via ORAL
  Filled 2021-11-16 (×3): qty 1

## 2021-11-16 MED ORDER — THIAMINE MONONITRATE 100 MG PO TABS
100.0000 mg | ORAL_TABLET | Freq: Every day | ORAL | Status: DC
  Administered 2021-11-16 – 2021-11-18 (×3): 100 mg via ORAL
  Filled 2021-11-16 (×3): qty 1

## 2021-11-16 MED ORDER — SODIUM CHLORIDE 0.9 % IV SOLN: INTRAVENOUS | Status: DC

## 2021-11-16 MED ORDER — ENSURE ENLIVE PO LIQD
237.0000 mL | ORAL | Status: DC
  Administered 2021-11-17: 237 mL via ORAL

## 2021-11-16 MED ORDER — SODIUM BICARBONATE 650 MG PO TABS
1300.0000 mg | ORAL_TABLET | Freq: Three times a day (TID) | ORAL | Status: DC
  Administered 2021-11-16 – 2021-11-18 (×6): 1300 mg via ORAL
  Filled 2021-11-16 (×6): qty 2

## 2021-11-16 MED ORDER — DARBEPOETIN ALFA 100 MCG/0.5ML IJ SOSY
100.0000 ug | PREFILLED_SYRINGE | INTRAMUSCULAR | Status: DC
  Administered 2021-11-16: 100 ug via SUBCUTANEOUS
  Filled 2021-11-16: qty 0.5

## 2021-11-16 NOTE — Progress Notes (Addendum)
Daily Progress Note  Hospital Day: 2  Chief Complaint: anemia  Brief History [name] is a 61 y.o. female with a pmh not limited to HCV, CKD IV, COPD, chronic anemia, PUD / Esophagitis / gastritis, colon polyps   Assessment / Plan   # 61 yo female with acute on chronic anemia ( iron deficient).  CKD contributing to anemia. Hgb 4.5, down from 8.4 in May 2022. Ferritin 11. Denies overt GI bleeding. No focal GI symptoms.   Hgb improved post 3 u PRBC 8.4 post transfusion Will need EGD , possibly tomorrow. The risks and benefits of EGD with possible biopsies were discussed with the patient who agrees to proceed.  Colonoscopy was done just one year ago ( though prep wasn't great) Continue BID IV PPI   # History of esophagitis / gastritis / gastric and duodenal ulcers in 2020. Biopsies negative for H.pylori.  Continue BID PPI   # Respiratory distress, multifactorial ( anemia, COPD and CAP) Getting Rocephin and Zithromax for CAP Steroids for COPD   # Hepatitis C, untreated / Nodularity of liver on [place] Only minimal free abdominal fluid on [place]. Normal spleen so nothing at present to suggest portal HTN.  At some point needs outpatient referral to ID for treatment of HCV.    # History of Etoh abuse. She tells me she was a heavy drinker for years but no Etoh in a month.. With history of Etoh abuse and HCV she is at risk for liver disease. Currently platelets are normal as is INR. She does have asterixis but ?  Renal failure. Etoh level < 10     # CKD V . Nephrology following. No emergent need for dialysis  Subjective   Breathing is better today. She is more lucid. She confirms that she does take Ibuprofen on a regular basis for back and leg pain. She has not had any overt GI bleeding  Objective   Endoscopic studies:  EGD Oct 2020 for epigastric pain and melena -LA Grade D acute esophagitis. Biopsied. - Acute gastritis. Biopsied. - Non-bleeding duodenal ulcers with no  stigmata of bleeding with severe bulbar duodenitis A. STOMACH, BIOPSY:  - Chronic inactive gastritis.  - There is no evidence of Helicobacter pylori, dysplasia, or malignancy.  - See comment.  B. ESOPHAGUS, BIOPSY:  - Benign squamous mucosa with ulceration.  - There is no evidence of dysplasia or malignancy.  - See comment.    EGD June 2021 for suspected GI bleed -No gross lesions in esophagus. - Z-line regular, 36 cm from the incisors. - Gastritis. - Non-bleeding gastric ulcers with no stigmata of bleeding. - Non-bleeding duodenal ulcers with no stigmata of bleeding. - Erythematous duodenopathy. - No specimens collected.  Colonoscopy August 2022 -Preparation of the colon was fair. - One 12 mm polyp in the proximal ascending colon, removed with a hot snare. Resected and retrieved. - One 10 mm polyp in the transverse colon, removed with a cold snare. Resected and retrieved. - Diverticulosis in the left colon. - The examination was otherwise normal on direct and retroflexion views.    Diagnosis 1. Surgical [P], colon, ascending, polyp (1) - SESSILE SERRATED POLYP(S) WITHOUT CYTOLOGIC DYSPLASIA 2. Surgical [P], colon, transverse, polyp (1) - HYPERPLASTIC POLYP(S)     Imaging:  US Abdomen Complete  Result Date: 11/15/2021 CLINICAL DATA:  History of hepatitis C and acute renal injury EXAM: ABDOMEN ULTRASOUND COMPLETE COMPARISON:  12/31/2018 FINDINGS: Gallbladder: Gallbladder is well distended. No cholelithiasis or  wall thickening is noted. Mild pericholecystic fluid is noted felt to be related to mild ascites. Common bile duct: Diameter: 4.7 mm. Liver: Nodularity is noted without focal mass. 8 mm left hepatic cyst is seen. Portal vein is patent on color Doppler imaging with normal direction of blood flow towards the liver. IVC: No abnormality visualized. Pancreas: Visualized portion unremarkable. Spleen: Size and appearance within normal limits. Fluid distended stomach Is noted  adjacent to the splenic hilum. Right Kidney: Length: 7.3 cm. Echogenicity within normal limits. No mass or hydronephrosis visualized. Left Kidney: Length: 8.3 cm. Echogenicity within normal limits. No mass or hydronephrosis visualized. Abdominal aorta: No aneurysm visualized. Other findings: Bladder is within normal limits. IMPRESSION: Minimal free abdominal fluid. Small up attic cyst.  No follow-up is recommended. Electronically Signed   By: Inez Catalina M.D.   On: 11/15/2021 19:24   ECHOCARDIOGRAM COMPLETE  Result Date: 11/15/2021    ECHOCARDIOGRAM REPORT   Patient Name:   [name] Igo Date of Exam: 11/15/2021 Medical Rec #:  [id number]      Height:       67.0 in Accession #:    [id number]     Weight:       127.6 lb Date of Birth:  1960/07/06      BSA:          1.671 m Patient Age:    19 years       BP:           130/66 mmHg Patient Gender: F              HR:           85 bpm. Exam Location:  Inpatient Procedure: 2D Echo, Cardiac Doppler, Color Doppler and Intracardiac            Opacification Agent Indications:    Elevated Troponin  History:        Patient has prior history of Echocardiogram examinations, most                 recent 08/20/2019. Signs/Symptoms:Shortness of Breath; Risk                 Factors:Hypertension. Chronic kidney disease. GERD. Non                 productive cough.  Sonographer:    Darlina Sicilian RDCS Referring Phys: [id number] Jonnie Finner  Sonographer Comments: Image acquisition challenging due to respiratory motion and Image acquisition challenging due to COPD. IMPRESSIONS  1. Left ventricular ejection fraction, by estimation, is 55 to 60%. The left ventricle has normal function. The left ventricle has no regional wall motion abnormalities. Left ventricular diastolic parameters are consistent with Grade I diastolic dysfunction (impaired relaxation).  2. Right ventricular systolic function is normal. The right ventricular size is normal. There is normal pulmonary artery systolic  pressure.  3. Left atrial size was mildly dilated.  4. The mitral valve is normal in structure. No evidence of mitral valve regurgitation. No evidence of mitral stenosis.  5. The aortic valve was not well visualized. Aortic valve regurgitation is not visualized. No aortic stenosis is present.  6. The inferior vena cava is normal in size with <50% respiratory variability, suggesting right atrial pressure of 8 mmHg. FINDINGS  Left Ventricle: Left ventricular ejection fraction, by estimation, is 55 to 60%. The left ventricle has normal function. The left ventricle has no regional wall motion abnormalities. The left ventricular internal cavity size was normal in  size. There is  no left ventricular hypertrophy. Left ventricular diastolic parameters are consistent with Grade I diastolic dysfunction (impaired relaxation). Indeterminate filling pressures. Right Ventricle: The right ventricular size is normal. No increase in right ventricular wall thickness. Right ventricular systolic function is normal. There is normal pulmonary artery systolic pressure. The tricuspid regurgitant velocity is 2.13 m/s, and  with an assumed right atrial pressure of 8 mmHg, the estimated right ventricular systolic pressure is 20.2 mmHg. Left Atrium: Left atrial size was mildly dilated. Right Atrium: Right atrial size was normal in size. Pericardium: There is no evidence of pericardial effusion. Mitral Valve: The mitral valve is normal in structure. No evidence of mitral valve regurgitation. No evidence of mitral valve stenosis. Tricuspid Valve: The tricuspid valve is normal in structure. Tricuspid valve regurgitation is trivial. No evidence of tricuspid stenosis. Aortic Valve: The aortic valve was not well visualized. Aortic valve regurgitation is not visualized. No aortic stenosis is present. Pulmonic Valve: The pulmonic valve was normal in structure. Pulmonic valve regurgitation is not visualized. No evidence of pulmonic stenosis. Aorta: The  aortic root is normal in size and structure. Venous: The inferior vena cava is normal in size with less than 50% respiratory variability, suggesting right atrial pressure of 8 mmHg. IAS/Shunts: No atrial level shunt detected by color flow Doppler.  LEFT VENTRICLE PLAX 2D LVIDd:         5.40 cm      Diastology LVIDs:         4.20 cm      LV e' medial:    8.70 cm/s LV PW:         0.85 cm      LV E/e' medial:  12.8 LV IVS:        0.80 cm      LV e' lateral:   9.90 cm/s LVOT diam:     2.00 cm      LV E/e' lateral: 11.2 LV SV:         65 LV SV Index:   39 LVOT Area:     3.14 cm  LV Volumes (MOD) LV vol d, MOD A2C: 128.0 ml LV vol d, MOD A4C: 118.0 ml LV vol s, MOD A2C: 51.4 ml LV vol s, MOD A4C: 44.3 ml LV SV MOD A2C:     76.6 ml LV SV MOD A4C:     118.0 ml LV SV MOD BP:      77.1 ml RIGHT VENTRICLE RV S prime:     11.00 cm/s TAPSE (M-mode): 2.2 cm LEFT ATRIUM             Index LA diam:        3.30 cm 1.97 cm/m LA Vol (A2C):   55.2 ml 33.04 ml/m LA Vol (A4C):   51.4 ml 30.76 ml/m LA Biplane Vol: 54.8 ml 32.80 ml/m  AORTIC VALVE LVOT Vmax:   112.00 cm/s LVOT Vmean:  83.400 cm/s LVOT VTI:    0.208 m  AORTA Ao Root diam: 3.20 cm MITRAL VALVE                TRICUSPID VALVE MV Area (PHT): 3.21 cm     TR Peak grad:   18.1 mmHg MV Decel Time: 236 msec     TR Vmax:        213.00 cm/s MV E velocity: 111.00 cm/s MV A velocity: 107.00 cm/s  SHUNTS MV E/A ratio:  1.04         Systemic  VTI:  0.21 m                             Systemic Diam: 2.00 cm Skeet Latch MD Electronically signed by Skeet Latch MD Signature Date/Time: 11/15/2021/6:50:05 PM    Final    CT CHEST WO CONTRAST  Result Date: 11/15/2021 CLINICAL DATA:  Respiratory illness/nondiagnostic radiograph. COPD. Gastroesophageal reflux disease. Hyperlipidemia. Renal insufficiency. Shortness of breath. EXAM: CT CHEST WITHOUT CONTRAST TECHNIQUE: Multidetector CT imaging of the chest was performed following the standard protocol without IV contrast. RADIATION  DOSE REDUCTION: This exam was performed according to the departmental dose-optimization program which includes automated exposure control, adjustment of the mA and/or kV according to patient size and/or use of iterative reconstruction technique. COMPARISON:  Plain film of earlier today.  No comparison CT FINDINGS: Cardiovascular: Mild motion degradation throughout. Aortic atherosclerosis. Mild cardiomegaly, without pericardial effusion. Three vessel coronary artery calcification. Mediastinum/Nodes: No mediastinal or definite hilar adenopathy, given limitations of unenhanced CT. Debris in the upper esophagus on 14/2. Lungs/Pleura: No pleural fluid. Dependent bilateral lower lobe compressive atelectasis. Diffuse peribronchovascular ground-glass nodularity. Most confluent in the left lower lobe, including on 71/8. No well-defined lobar consolidation. Upper Abdomen: Segment 2-3 subcentimeter left hepatic lobe cyst. Normal imaged portions of the spleen, stomach, adrenal glands. Musculoskeletal: No acute osseous abnormality. IMPRESSION: 1. Mildly motion degraded exam. 2. Relatively diffuse peribronchovascular ground-glass nodularity, most consistent with infection, including atypical etiologies. 3. Debris within the esophagus may represent dysmotility and/or gastroesophageal reflux. 4. Age advanced coronary artery atherosclerosis. Recommend assessment of coronary risk factors. 5.  Aortic Atherosclerosis (ICD10-I70.0). Electronically Signed   By: Abigail Miyamoto M.D.   On: 11/15/2021 12:37   DG Chest Port 1 View  Result Date: 11/15/2021 CLINICAL DATA:  Shortness of breath EXAM: PORTABLE CHEST 1 VIEW COMPARISON:  08/19/2019 FINDINGS: Borderline heart size. Congested appearance of central vessels versus airway thickening. There is no edema, consolidation, effusion, or pneumothorax. IMPRESSION: Symmetric airway thickening or vascular congestion Electronically Signed   By: Jorje Guild M.D.   On: 11/15/2021 06:29    Lab  Results: Recent Labs    11/15/21 0528 11/15/21 1103 11/15/21 1747 11/16/21 0012 11/16/21 0602  WBC 9.2  --   --  9.2 9.2  HGB 4.5*   < > 6.6* 7.6* 8.4*  HCT 15.1*   < > 20.5* 23.9* 26.1*  PLT 368  --   --  326 328   < > = values in this interval not displayed.   BMET Recent Labs    11/15/21 0528 11/16/21 0012  NA 135 135  K 4.1 4.6  CL 112* 106  CO2 15* 14*  GLUCOSE 115* 113*  BUN 40* 43*  CREATININE 4.15* 4.46*  CALCIUM 7.7* 8.1*   LFT Recent Labs    11/16/21 0012  PROT 7.4  ALBUMIN 2.7*  AST 21  ALT 11  ALKPHOS 49  BILITOT 0.4   PT/INR No results for input(s): "LABPROT", "INR" in the last 72 hours.   Scheduled inpatient medications:   folic acid  1 mg Oral Daily   ipratropium-albuterol  3 mL Nebulization TID   multivitamin with minerals  1 tablet Oral Daily   pantoprazole (PROTONIX) IV  40 mg Intravenous Q12H   pneumococcal 20-valent conjugate vaccine  0.5 mL Intramuscular Tomorrow-1000   predniSONE  40 mg Oral Q breakfast   sodium bicarbonate  1,300 mg Oral BID   thiamine  100 mg Oral  Daily   Continuous inpatient infusions:   sodium chloride     azithromycin 500 mg (11/15/21 1834)   cefTRIAXone (ROCEPHIN)  IV Stopped (11/15/21 1722)   PRN inpatient medications: HYDROcodone-acetaminophen  Vital signs in last 24 hours: Temp:  [97.6 F (36.4 C)-98.4 F (36.9 C)] 97.9 F (36.6 C) (08/31 2123) Pulse Rate:  [79-94] 87 (08/31 2123) Resp:  [11-33] 18 (08/31 2123) BP: (107-132)/(60-86) 131/86 (08/31 2123) SpO2:  [91 %-100 %] 91 % (09/01 0846) Weight:  [57.9 kg] 57.9 kg (08/31 1339) Last BM Date : 11/14/21  Intake/Output Summary (Last 24 hours) at 11/16/2021 0922 Last data filed at 11/16/2021 0532 Gross per 24 hour  Intake 1046.46 ml  Output 1250 ml  Net -203.54 ml    Intake/Output from previous day: 08/31 0701 - 09/01 0700 In: 1669.8 [I.V.:50; Blood:1519.8; IV Piggyback:100] Out: 1250 [Urine:1250] Intake/Output this shift: No intake/output  data recorded.   Physical Exam:  General: Alert female in NAD Heart:  Regular rate and rhythm. No lower extremity edema Pulmonary: Normal respiratory effort at rest, slightly labored with talking. Bibasilar crackles Abdomen: Soft, nondistended, nontender. Normal bowel sounds.  Neurologic: Alert and oriented Psych: Pleasant. Cooperative.    Active Problems:   Hypertension   Chronic hepatitis C without hepatic coma (HCC)   CKD (chronic kidney disease) stage 4, GFR 15-29 ml/min (HCC)   Asthma with chronic obstructive pulmonary disease (COPD) (HCC)   AKI (acute kidney injury) (HCC)   Metabolic acidosis   GIB (gastrointestinal bleeding)   Symptomatic anemia   Elevated brain natriuretic peptide (BNP) level   Elevated troponin   HLD (hyperlipidemia)   Hypocalcemia     LOS: 1 day   Tye Savoy ,NP 11/16/2021, 9:22 AM   Attending physician's note   I have taken history, reviewed the chart and examined the patient. I performed a substantive portion of this encounter, including complete performance of at least one of the key components, in conjunction with the APP. I agree with the Advanced Practitioner's note, impression and recommendations.   No BM s to Hemoccult No active bleeding. S/P 3U to 8.4. Much better from respiratory standpoint Proceed with EGD in a.m. Explained risks and benefits. Trend CBC.   Carmell Austria, MD [facility] [phone]

## 2021-11-16 NOTE — Progress Notes (Signed)
   11/16/21 [name]  Within the past 12 months, you worried that your food would run out before you got the money to buy more. Never true (pt states had food stamps, currently waiting to recertify)  Within the past 12 months, the food you bought just didn't last and you didn't have money to get more. Never true  SDOH Interventions  Food Insecurity Interventions Other (Comment) (pt states had food stamps, currently waiting to recertify; resources for access to food provided on discharge instructions)

## 2021-11-16 NOTE — H&P (View-Only) (Signed)
Daily Progress Note  Hospital Day: 2  Chief Complaint: anemia  Brief History [name] is a 61 y.o. female with a pmh not limited to HCV, CKD IV, COPD, chronic anemia, PUD / Esophagitis / gastritis, colon polyps   Assessment / Plan   # 61 yo female with acute on chronic anemia ( iron deficient).  CKD contributing to anemia. Hgb 4.5, down from 8.4 in May 2022. Ferritin 11. Denies overt GI bleeding. No focal GI symptoms.   Hgb improved post 3 u PRBC 8.4 post transfusion Will need EGD , possibly tomorrow. The risks and benefits of EGD with possible biopsies were discussed with the patient who agrees to proceed.  Colonoscopy was done just one year ago ( though prep wasn't great) Continue BID IV PPI   # History of esophagitis / gastritis / gastric and duodenal ulcers in 2020. Biopsies negative for H.pylori.  Continue BID PPI   # Respiratory distress, multifactorial ( anemia, COPD and CAP) Getting Rocephin and Zithromax for CAP Steroids for COPD   # Hepatitis C, untreated / Nodularity of liver on [place] Only minimal free abdominal fluid on [place]. Normal spleen so nothing at present to suggest portal HTN.  At some point needs outpatient referral to ID for treatment of HCV.    # History of Etoh abuse. She tells me she was a heavy drinker for years but no Etoh in a month.. With history of Etoh abuse and HCV she is at risk for liver disease. Currently platelets are normal as is INR. She does have asterixis but ?  Renal failure. Etoh level < 10     # CKD V . Nephrology following. No emergent need for dialysis  Subjective   Breathing is better today. She is more lucid. She confirms that she does take Ibuprofen on a regular basis for back and leg pain. She has not had any overt GI bleeding  Objective   Endoscopic studies:  EGD Oct 2020 for epigastric pain and melena -LA Grade D acute esophagitis. Biopsied. - Acute gastritis. Biopsied. - Non-bleeding duodenal ulcers with no  stigmata of bleeding with severe bulbar duodenitis A. STOMACH, BIOPSY:  - Chronic inactive gastritis.  - There is no evidence of Helicobacter pylori, dysplasia, or malignancy.  - See comment.  B. ESOPHAGUS, BIOPSY:  - Benign squamous mucosa with ulceration.  - There is no evidence of dysplasia or malignancy.  - See comment.    EGD June 2021 for suspected GI bleed -No gross lesions in esophagus. - Z-line regular, 36 cm from the incisors. - Gastritis. - Non-bleeding gastric ulcers with no stigmata of bleeding. - Non-bleeding duodenal ulcers with no stigmata of bleeding. - Erythematous duodenopathy. - No specimens collected.  Colonoscopy August 2022 -Preparation of the colon was fair. - One 12 mm polyp in the proximal ascending colon, removed with a hot snare. Resected and retrieved. - One 10 mm polyp in the transverse colon, removed with a cold snare. Resected and retrieved. - Diverticulosis in the left colon. - The examination was otherwise normal on direct and retroflexion views.    Diagnosis 1. Surgical [P], colon, ascending, polyp (1) - SESSILE SERRATED POLYP(S) WITHOUT CYTOLOGIC DYSPLASIA 2. Surgical [P], colon, transverse, polyp (1) - HYPERPLASTIC POLYP(S)     Imaging:  US Abdomen Complete  Result Date: 11/15/2021 CLINICAL DATA:  History of hepatitis C and acute renal injury EXAM: ABDOMEN ULTRASOUND COMPLETE COMPARISON:  12/31/2018 FINDINGS: Gallbladder: Gallbladder is well distended. No cholelithiasis or  wall thickening is noted. Mild pericholecystic fluid is noted felt to be related to mild ascites. Common bile duct: Diameter: 4.7 mm. Liver: Nodularity is noted without focal mass. 8 mm left hepatic cyst is seen. Portal vein is patent on color Doppler imaging with normal direction of blood flow towards the liver. IVC: No abnormality visualized. Pancreas: Visualized portion unremarkable. Spleen: Size and appearance within normal limits. Fluid distended stomach Is noted  adjacent to the splenic hilum. Right Kidney: Length: 7.3 cm. Echogenicity within normal limits. No mass or hydronephrosis visualized. Left Kidney: Length: 8.3 cm. Echogenicity within normal limits. No mass or hydronephrosis visualized. Abdominal aorta: No aneurysm visualized. Other findings: Bladder is within normal limits. IMPRESSION: Minimal free abdominal fluid. Small up attic cyst.  No follow-up is recommended. Electronically Signed   By: Inez Catalina M.D.   On: 11/15/2021 19:24   ECHOCARDIOGRAM COMPLETE  Result Date: 11/15/2021    ECHOCARDIOGRAM REPORT   Patient Name:   [name] Date of Exam: 11/15/2021 Medical Rec #:  [id number]      Height:       67.0 in Accession #:    [id number]     Weight:       127.6 lb Date of Birth:  Mar 31, 1960      BSA:          1.671 m Patient Age:    57 years       BP:           130/66 mmHg Patient Gender: F              HR:           85 bpm. Exam Location:  Inpatient Procedure: 2D Echo, Cardiac Doppler, Color Doppler and Intracardiac            Opacification Agent Indications:    Elevated Troponin  History:        Patient has prior history of Echocardiogram examinations, most                 recent 08/20/2019. Signs/Symptoms:Shortness of Breath; Risk                 Factors:Hypertension. Chronic kidney disease. GERD. Non                 productive cough.  Sonographer:    Darlina Sicilian RDCS Referring Phys: [id number] Jonnie Finner  Sonographer Comments: Image acquisition challenging due to respiratory motion and Image acquisition challenging due to COPD. IMPRESSIONS  1. Left ventricular ejection fraction, by estimation, is 55 to 60%. The left ventricle has normal function. The left ventricle has no regional wall motion abnormalities. Left ventricular diastolic parameters are consistent with Grade I diastolic dysfunction (impaired relaxation).  2. Right ventricular systolic function is normal. The right ventricular size is normal. There is normal pulmonary artery systolic  pressure.  3. Left atrial size was mildly dilated.  4. The mitral valve is normal in structure. No evidence of mitral valve regurgitation. No evidence of mitral stenosis.  5. The aortic valve was not well visualized. Aortic valve regurgitation is not visualized. No aortic stenosis is present.  6. The inferior vena cava is normal in size with <50% respiratory variability, suggesting right atrial pressure of 8 mmHg. FINDINGS  Left Ventricle: Left ventricular ejection fraction, by estimation, is 55 to 60%. The left ventricle has normal function. The left ventricle has no regional wall motion abnormalities. The left ventricular internal cavity size was normal in  size. There is  no left ventricular hypertrophy. Left ventricular diastolic parameters are consistent with Grade I diastolic dysfunction (impaired relaxation). Indeterminate filling pressures. Right Ventricle: The right ventricular size is normal. No increase in right ventricular wall thickness. Right ventricular systolic function is normal. There is normal pulmonary artery systolic pressure. The tricuspid regurgitant velocity is 2.13 m/s, and  with an assumed right atrial pressure of 8 mmHg, the estimated right ventricular systolic pressure is 02.4 mmHg. Left Atrium: Left atrial size was mildly dilated. Right Atrium: Right atrial size was normal in size. Pericardium: There is no evidence of pericardial effusion. Mitral Valve: The mitral valve is normal in structure. No evidence of mitral valve regurgitation. No evidence of mitral valve stenosis. Tricuspid Valve: The tricuspid valve is normal in structure. Tricuspid valve regurgitation is trivial. No evidence of tricuspid stenosis. Aortic Valve: The aortic valve was not well visualized. Aortic valve regurgitation is not visualized. No aortic stenosis is present. Pulmonic Valve: The pulmonic valve was normal in structure. Pulmonic valve regurgitation is not visualized. No evidence of pulmonic stenosis. Aorta: The  aortic root is normal in size and structure. Venous: The inferior vena cava is normal in size with less than 50% respiratory variability, suggesting right atrial pressure of 8 mmHg. IAS/Shunts: No atrial level shunt detected by color flow Doppler.  LEFT VENTRICLE PLAX 2D LVIDd:         5.40 cm      Diastology LVIDs:         4.20 cm      LV e' medial:    8.70 cm/s LV PW:         0.85 cm      LV E/e' medial:  12.8 LV IVS:        0.80 cm      LV e' lateral:   9.90 cm/s LVOT diam:     2.00 cm      LV E/e' lateral: 11.2 LV SV:         65 LV SV Index:   39 LVOT Area:     3.14 cm  LV Volumes (MOD) LV vol d, MOD A2C: 128.0 ml LV vol d, MOD A4C: 118.0 ml LV vol s, MOD A2C: 51.4 ml LV vol s, MOD A4C: 44.3 ml LV SV MOD A2C:     76.6 ml LV SV MOD A4C:     118.0 ml LV SV MOD BP:      77.1 ml RIGHT VENTRICLE RV S prime:     11.00 cm/s TAPSE (M-mode): 2.2 cm LEFT ATRIUM             Index LA diam:        3.30 cm 1.97 cm/m LA Vol (A2C):   55.2 ml 33.04 ml/m LA Vol (A4C):   51.4 ml 30.76 ml/m LA Biplane Vol: 54.8 ml 32.80 ml/m  AORTIC VALVE LVOT Vmax:   112.00 cm/s LVOT Vmean:  83.400 cm/s LVOT VTI:    0.208 m  AORTA Ao Root diam: 3.20 cm MITRAL VALVE                TRICUSPID VALVE MV Area (PHT): 3.21 cm     TR Peak grad:   18.1 mmHg MV Decel Time: 236 msec     TR Vmax:        213.00 cm/s MV E velocity: 111.00 cm/s MV A velocity: 107.00 cm/s  SHUNTS MV E/A ratio:  1.04         Systemic  VTI:  0.21 m                             Systemic Diam: 2.00 cm Skeet Latch MD Electronically signed by Skeet Latch MD Signature Date/Time: 11/15/2021/6:50:05 PM    Final    CT CHEST WO CONTRAST  Result Date: 11/15/2021 CLINICAL DATA:  Respiratory illness/nondiagnostic radiograph. COPD. Gastroesophageal reflux disease. Hyperlipidemia. Renal insufficiency. Shortness of breath. EXAM: CT CHEST WITHOUT CONTRAST TECHNIQUE: Multidetector CT imaging of the chest was performed following the standard protocol without IV contrast. RADIATION  DOSE REDUCTION: This exam was performed according to the departmental dose-optimization program which includes automated exposure control, adjustment of the mA and/or kV according to patient size and/or use of iterative reconstruction technique. COMPARISON:  Plain film of earlier today.  No comparison CT FINDINGS: Cardiovascular: Mild motion degradation throughout. Aortic atherosclerosis. Mild cardiomegaly, without pericardial effusion. Three vessel coronary artery calcification. Mediastinum/Nodes: No mediastinal or definite hilar adenopathy, given limitations of unenhanced CT. Debris in the upper esophagus on 14/2. Lungs/Pleura: No pleural fluid. Dependent bilateral lower lobe compressive atelectasis. Diffuse peribronchovascular ground-glass nodularity. Most confluent in the left lower lobe, including on 71/8. No well-defined lobar consolidation. Upper Abdomen: Segment 2-3 subcentimeter left hepatic lobe cyst. Normal imaged portions of the spleen, stomach, adrenal glands. Musculoskeletal: No acute osseous abnormality. IMPRESSION: 1. Mildly motion degraded exam. 2. Relatively diffuse peribronchovascular ground-glass nodularity, most consistent with infection, including atypical etiologies. 3. Debris within the esophagus may represent dysmotility and/or gastroesophageal reflux. 4. Age advanced coronary artery atherosclerosis. Recommend assessment of coronary risk factors. 5.  Aortic Atherosclerosis (ICD10-I70.0). Electronically Signed   By: Abigail Miyamoto M.D.   On: 11/15/2021 12:37   DG Chest Port 1 View  Result Date: 11/15/2021 CLINICAL DATA:  Shortness of breath EXAM: PORTABLE CHEST 1 VIEW COMPARISON:  08/19/2019 FINDINGS: Borderline heart size. Congested appearance of central vessels versus airway thickening. There is no edema, consolidation, effusion, or pneumothorax. IMPRESSION: Symmetric airway thickening or vascular congestion Electronically Signed   By: Jorje Guild M.D.   On: 11/15/2021 06:29    Lab  Results: Recent Labs    11/15/21 0528 11/15/21 1103 11/15/21 1747 11/16/21 0012 11/16/21 0602  WBC 9.2  --   --  9.2 9.2  HGB 4.5*   < > 6.6* 7.6* 8.4*  HCT 15.1*   < > 20.5* 23.9* 26.1*  PLT 368  --   --  326 328   < > = values in this interval not displayed.   BMET Recent Labs    11/15/21 0528 11/16/21 0012  NA 135 135  K 4.1 4.6  CL 112* 106  CO2 15* 14*  GLUCOSE 115* 113*  BUN 40* 43*  CREATININE 4.15* 4.46*  CALCIUM 7.7* 8.1*   LFT Recent Labs    11/16/21 0012  PROT 7.4  ALBUMIN 2.7*  AST 21  ALT 11  ALKPHOS 49  BILITOT 0.4   PT/INR No results for input(s): "LABPROT", "INR" in the last 72 hours.   Scheduled inpatient medications:   folic acid  1 mg Oral Daily   ipratropium-albuterol  3 mL Nebulization TID   multivitamin with minerals  1 tablet Oral Daily   pantoprazole (PROTONIX) IV  40 mg Intravenous Q12H   pneumococcal 20-valent conjugate vaccine  0.5 mL Intramuscular Tomorrow-1000   predniSONE  40 mg Oral Q breakfast   sodium bicarbonate  1,300 mg Oral BID   thiamine  100 mg Oral  Daily   Continuous inpatient infusions:   sodium chloride     azithromycin 500 mg (11/15/21 1834)   cefTRIAXone (ROCEPHIN)  IV Stopped (11/15/21 1722)   PRN inpatient medications: HYDROcodone-acetaminophen  Vital signs in last 24 hours: Temp:  [97.6 F (36.4 C)-98.4 F (36.9 C)] 97.9 F (36.6 C) (08/31 2123) Pulse Rate:  [79-94] 87 (08/31 2123) Resp:  [11-33] 18 (08/31 2123) BP: (107-132)/(60-86) 131/86 (08/31 2123) SpO2:  [91 %-100 %] 91 % (09/01 0846) Weight:  [57.9 kg] 57.9 kg (08/31 1339) Last BM Date : 11/14/21  Intake/Output Summary (Last 24 hours) at 11/16/2021 0922 Last data filed at 11/16/2021 0532 Gross per 24 hour  Intake 1046.46 ml  Output 1250 ml  Net -203.54 ml    Intake/Output from previous day: 08/31 0701 - 09/01 0700 In: 1669.8 [I.V.:50; Blood:1519.8; IV Piggyback:100] Out: 1250 [Urine:1250] Intake/Output this shift: No intake/output  data recorded.   Physical Exam:  General: Alert female in NAD Heart:  Regular rate and rhythm. No lower extremity edema Pulmonary: Normal respiratory effort at rest, slightly labored with talking. Bibasilar crackles Abdomen: Soft, nondistended, nontender. Normal bowel sounds.  Neurologic: Alert and oriented Psych: Pleasant. Cooperative.    Active Problems:   Hypertension   Chronic hepatitis C without hepatic coma (HCC)   CKD (chronic kidney disease) stage 4, GFR 15-29 ml/min (HCC)   Asthma with chronic obstructive pulmonary disease (COPD) (HCC)   AKI (acute kidney injury) (HCC)   Metabolic acidosis   GIB (gastrointestinal bleeding)   Symptomatic anemia   Elevated brain natriuretic peptide (BNP) level   Elevated troponin   HLD (hyperlipidemia)   Hypocalcemia     LOS: 1 day   Tye Savoy ,NP 11/16/2021, 9:22 AM   Attending physician's note   I have taken history, reviewed the chart and examined the patient. I performed a substantive portion of this encounter, including complete performance of at least one of the key components, in conjunction with the APP. I agree with the Advanced Practitioner's note, impression and recommendations.   No BM s to Hemoccult No active bleeding. S/P 3U to 8.4. Much better from respiratory standpoint Proceed with EGD in a.m. Explained risks and benefits. Trend CBC.   Carmell Austria, MD [facility] [phone]

## 2021-11-16 NOTE — TOC Progression Note (Signed)
Transition of Care [facility]) - Progression Note    Patient Details  Name: [name] MRN: [id number] Date of Birth: December 21, 1960  Transition of Care [facility] CM/SW Contact  Abreanna Drawdy, Juliann Pulse, RN Phone Number: 11/16/2021, 12:32 PM  Clinical Narrative:   Noted referral for home 02 eval-will await documented 03 sats, & home 02 order if qualifies. [facility] to follow.    Expected Discharge Plan: Home/Self Care Barriers to Discharge: Continued Medical Work up  Expected Discharge Plan and Services Expected Discharge Plan: Home/Self Care   Discharge Planning Services: CM Consult   Living arrangements for the past 2 months: Apartment                                       Social Determinants of Health (SDOH) Interventions Food Insecurity Interventions: Intervention Not Indicated Housing Interventions: Other (Comment) (housing resource on discharge instructions) Transportation Interventions: Bus Pass Given  Readmission Risk Interventions    11/16/2021   12:16 PM 08/23/2019   11:46 AM  Readmission Risk Prevention Plan  Transportation Screening Complete Complete  PCP or Specialist Appt within 3-5 Days Complete   HRI or [place] Complete   Social Work Consult for [place] Planning/Counseling Complete   Palliative Care Screening Not Applicable   Medication Review [employer] photographer) Complete Complete  PCP or Specialist appointment within 3-5 days of discharge  Complete  HRI or [place]  Complete  SW Recovery Care/Counseling Consult  Complete  [place]  Not Applicable

## 2021-11-16 NOTE — Progress Notes (Addendum)
PROGRESS NOTE    [name]  DJT:[id number] DOB: [birth year] DOA: 11/15/2021 PCP: Ladell Pier, MD   Brief Narrative:  HPI: [name] is a 61 y.o. female with medical history significant of CKD4, chronic hep C, GERD, duodenal ulcer, HTN, COPD. Presenting with dyspnea. She reports that she has had shortness of breath for the last several days. She has had non-productive cough during this time. She tried her inhaler at home, but it has not helped. She has not had any sick contacts that she is aware of. She has not had any chest pain. She does reports some intermittent dark stools. When her symptoms worsened this morning. She decided to come to the ED for help. She denies any other aggravating or alleviating factors.   Assessment & Plan:   Active Problems:   Hypertension   Chronic hepatitis C without hepatic coma (HCC)   CKD (chronic kidney disease) stage 4, GFR 15-29 ml/min (HCC)   Asthma with chronic obstructive pulmonary disease (COPD) (HCC)   AKI (acute kidney injury) (HCC)   Metabolic acidosis   GIB (gastrointestinal bleeding)   Symptomatic anemia   Elevated brain natriuretic peptide (BNP) level   Elevated troponin   HLD (hyperlipidemia)   Hypocalcemia  Symptomatic anemia/possible upper GI bleed/iron deficiency anemia: Presented with hemoglobin of 4.5.  Received 3 units of PRBC transfusion.  Hemoglobin 8.7.  Continue Protonix.  GI on board.  Plan for EGD once respiratory status better.  Monitor H&H every 12 hours.  Acute hypoxic respiratory failure secondary to acute COPD exacerbation: Respiratory viral panel and influenza and COVID-negative.  Apparently patient had wheezes and she improved after steroids and bronchodilators.  She required 4 L of oxygen.  Currently she is on room air and feels better.  No wheezes on exam but she has cough.  She is current smoker.  Continue antibiotics and steroids.  Aspiration pneumonia/respiratory acidosis: CT chest suspicion for  aspiration pneumonia and she also has GERD so this is very likely.  Procalcitonin 0.31.  Continue Rocephin and Zithromax.  Essential hypertension: Blood pressure controlled while amlodipine on hold.  AKI on CKD stage IV vs progressive CKD stage V: Seen by nephrology.  Management per them.  Metabolic acidosis: Nephrology on board and she is on bicarb tablets.  History of hepatitis C: LFTs stable.  History of noncompliance: Counseled.  DVT prophylaxis: SCDs Start: 11/15/21 1341 avoiding heparin products due to possible GI bleed and severe anemia   Code Status: Full Code  Family Communication:  None present at bedside.  Plan of care discussed with patient in length and he/she verbalized understanding and agreed with it.  Status is: Inpatient Remains inpatient appropriate because: Needs EGD   Estimated body mass index is 19.99 kg/m as calculated from the following:   Height as of this encounter: '5\' 7"'$  (1.702 m).   Weight as of this encounter: 57.9 kg.    Nutritional Assessment: Body mass index is 19.99 kg/m.Marland Kitchen Seen by dietician.  I agree with the assessment and plan as outlined below: Nutrition Status:        . Skin Assessment: I have examined the patient's skin and I agree with the wound assessment as performed by the wound care RN as outlined below:    Consultants:  GI Nephrology  Procedures:  None  Antimicrobials:  Anti-infectives (From admission, onward)    Start     Dose/Rate Route Frequency Ordered Stop   11/15/21 1700  azithromycin (ZITHROMAX) 500 mg in sodium chloride  0.9 % 250 mL IVPB        500 mg 250 mL/hr over 60 Minutes Intravenous Every 24 hours 11/15/21 1505     11/15/21 1600  cefTRIAXone (ROCEPHIN) 2 g in sodium chloride 0.9 % 100 mL IVPB  Status:  Discontinued        2 g 200 mL/hr over 30 Minutes Intravenous Every 24 hours 11/15/21 1505 11/16/21 1036         Subjective: Patient seen and examined.  Complains of cough and some shortness of  breath but improved compared to yesterday.  No other complaint.  Objective: Vitals:   11/15/21 1904 11/15/21 2000 11/15/21 2123 11/16/21 0846  BP: 114/76  131/86   Pulse: 89  87   Resp:  13 18   Temp: 97.6 F (36.4 C)  97.9 F (36.6 C)   TempSrc: Oral  Oral   SpO2: 96% 96% 99% 91%  Weight:      Height:        Intake/Output Summary (Last 24 hours) at 11/16/2021 1036 Last data filed at 11/16/2021 0532 Gross per 24 hour  Intake 1046.46 ml  Output 1250 ml  Net -203.54 ml   Filed Weights   11/15/21 0530 11/15/21 1339  Weight: 62.6 kg 57.9 kg    Examination:  General exam: Appears calm and comfortable  Respiratory system: Rhonchi at the right base.Marland Kitchen Respiratory effort normal. Cardiovascular system: S1 & S2 heard, RRR. No JVD, murmurs, rubs, gallops or clicks. No pedal edema. Gastrointestinal system: Abdomen is nondistended, soft and nontender. No organomegaly or masses felt. Normal bowel sounds heard. Central nervous system: Alert and oriented. No focal neurological deficits. Extremities: Symmetric 5 x 5 power. Skin: No rashes, lesions or ulcers Psychiatry: Judgement and insight appear normal. Mood & affect appropriate.    Data Reviewed: I have personally reviewed following labs and imaging studies  CBC: Recent Labs  Lab 11/15/21 0528 11/15/21 1103 11/15/21 1747 11/16/21 0012 11/16/21 0602  WBC 9.2  --   --  9.2 9.2  NEUTROABS 5.9  --   --   --   --   HGB 4.5* 5.8* 6.6* 7.6* 8.4*  HCT 15.1* 18.6* 20.5* 23.9* 26.1*  MCV 95.6  --   --  90.2 89.1  PLT 368  --   --  326 299   Basic Metabolic Panel: Recent Labs  Lab 11/15/21 0528 11/16/21 0012  NA 135 135  K 4.1 4.6  CL 112* 106  CO2 15* 14*  GLUCOSE 115* 113*  BUN 40* 43*  CREATININE 4.15* 4.46*  CALCIUM 7.7* 8.1*  PHOS  --  6.2*   GFR: Estimated Creatinine Clearance: 12.1 mL/min (A) (by C-G formula based on SCr of 4.46 mg/dL (H)). Liver Function Tests: Recent Labs  Lab 11/15/21 0528 11/16/21 0012   AST 22 21  ALT 10 11  ALKPHOS 53 49  BILITOT 0.4 0.4  PROT 7.2 7.4  ALBUMIN 2.6* 2.7*   No results for input(s): "LIPASE", "AMYLASE" in the last 168 hours. Recent Labs  Lab 11/15/21 1103  AMMONIA 11   Coagulation Profile: No results for input(s): "INR", "PROTIME" in the last 168 hours. Cardiac Enzymes: No results for input(s): "CKTOTAL", "CKMB", "CKMBINDEX", "TROPONINI" in the last 168 hours. BNP (last 3 results) No results for input(s): "PROBNP" in the last 8760 hours. HbA1C: No results for input(s): "HGBA1C" in the last 72 hours. CBG: No results for input(s): "GLUCAP" in the last 168 hours. Lipid Profile: No results for input(s): "CHOL", "HDL", "  [place]", "TRIG", "CHOLHDL", "LDLDIRECT" in the last 72 hours. Thyroid Function Tests: No results for input(s): "TSH", "T4TOTAL", "FREET4", "T3FREE", "THYROIDAB" in the last 72 hours. Anemia Panel: Recent Labs    11/15/21 1228  FERRITIN 11  TIBC 449  IRON 14*   Sepsis Labs: Recent Labs  Lab 11/15/21 0537 11/15/21 1526  PROCALCITON  --  0.31  LATICACIDVEN 1.2  --     Recent Results (from the past 240 hour(s))  SARS Coronavirus 2 by RT PCR (hospital order, performed in [facility] hospital lab) *cepheid single result test* Anterior Nasal Swab     Status: None   Collection Time: 11/15/21  5:45 AM   Specimen: Anterior Nasal Swab  Result Value Ref Range Status   SARS Coronavirus 2 by RT PCR NEGATIVE NEGATIVE Final    Comment: (NOTE) SARS-CoV-2 target nucleic acids are NOT DETECTED.  The SARS-CoV-2 RNA is generally detectable in upper and lower respiratory specimens during the acute phase of infection. The lowest concentration of SARS-CoV-2 viral copies this assay can detect is 250 copies / mL. A negative result does not preclude SARS-CoV-2 infection and should not be used as the sole basis for treatment or other patient management decisions.  A negative result may occur with improper specimen collection / handling,  submission of specimen other than nasopharyngeal swab, presence of viral mutation(s) within the areas targeted by this assay, and inadequate number of viral copies (<250 copies / mL). A negative result must be combined with clinical observations, patient history, and epidemiological information.  Fact Sheet for Patients:   https://www.patel.info/  Fact Sheet for Healthcare Providers: https://hall.com/  This test is not yet approved or  cleared by the [place] FDA and has been authorized for detection and/or diagnosis of SARS-CoV-2 by FDA under an Emergency Use Authorization (EUA).  This EUA will remain in effect (meaning this test can be used) for the duration of the COVID-19 declaration under Section 564(b)(1) of the Act, 21 U.S.C. section 360bbb-3(b)(1), unless the authorization is terminated or revoked sooner.  Performed at [facility], [place] [address] [place], [place] [address]   Respiratory (~20 pathogens) panel by PCR     Status: None   Collection Time: 11/15/21  4:41 PM   Specimen: Nasopharyngeal Swab; Respiratory  Result Value Ref Range Status   Adenovirus NOT DETECTED NOT DETECTED Final   Coronavirus 229E NOT DETECTED NOT DETECTED Final    Comment: (NOTE) The Coronavirus on the Respiratory Panel, DOES NOT test for the novel  Coronavirus (2019 nCoV)    Coronavirus HKU1 NOT DETECTED NOT DETECTED Final   Coronavirus NL63 NOT DETECTED NOT DETECTED Final   Coronavirus OC43 NOT DETECTED NOT DETECTED Final   Metapneumovirus NOT DETECTED NOT DETECTED Final   Rhinovirus / Enterovirus NOT DETECTED NOT DETECTED Final   Influenza A NOT DETECTED NOT DETECTED Final   Influenza B NOT DETECTED NOT DETECTED Final   Parainfluenza Virus 1 NOT DETECTED NOT DETECTED Final   Parainfluenza Virus 2 NOT DETECTED NOT DETECTED Final   Parainfluenza Virus 3 NOT DETECTED NOT DETECTED Final   Parainfluenza Virus 4 NOT DETECTED  NOT DETECTED Final   Respiratory Syncytial Virus NOT DETECTED NOT DETECTED Final   Bordetella pertussis NOT DETECTED NOT DETECTED Final   Bordetella Parapertussis NOT DETECTED NOT DETECTED Final   Chlamydophila pneumoniae NOT DETECTED NOT DETECTED Final   Mycoplasma pneumoniae NOT DETECTED NOT DETECTED Final    Comment: Performed at [facility], [place]. [address],  [place], [place] [address]     Radiology Studies: US Abdomen Complete  Result Date: 11/15/2021 CLINICAL DATA:  History of hepatitis C and acute renal injury EXAM: ABDOMEN ULTRASOUND COMPLETE COMPARISON:  12/31/2018 FINDINGS: Gallbladder: Gallbladder is well distended. No cholelithiasis or wall thickening is noted. Mild pericholecystic fluid is noted felt to be related to mild ascites. Common bile duct: Diameter: 4.7 mm. Liver: Nodularity is noted without focal mass. 8 mm left hepatic cyst is seen. Portal vein is patent on color Doppler imaging with normal direction of blood flow towards the liver. IVC: No abnormality visualized. Pancreas: Visualized portion unremarkable. Spleen: Size and appearance within normal limits. Fluid distended stomach Is noted adjacent to the splenic hilum. Right Kidney: Length: 7.3 cm. Echogenicity within normal limits. No mass or hydronephrosis visualized. Left Kidney: Length: 8.3 cm. Echogenicity within normal limits. No mass or hydronephrosis visualized. Abdominal aorta: No aneurysm visualized. Other findings: Bladder is within normal limits. IMPRESSION: Minimal free abdominal fluid. Small up attic cyst.  No follow-up is recommended. Electronically Signed   By: Inez Catalina M.D.   On: 11/15/2021 19:24   ECHOCARDIOGRAM COMPLETE  Result Date: 11/15/2021    ECHOCARDIOGRAM REPORT   Patient Name:   REBIE PEALE Mecham Date of Exam: 11/15/2021 Medical Rec #:  [id number]      Height:       67.0 in Accession #:    [id number]     Weight:       127.6 lb Date of Birth:  1961/03/18      BSA:          1.671 m Patient Age:     10 years       BP:           130/66 mmHg Patient Gender: F              HR:           85 bpm. Exam Location:  Inpatient Procedure: 2D Echo, Cardiac Doppler, Color Doppler and Intracardiac            Opacification Agent Indications:    Elevated Troponin  History:        Patient has prior history of Echocardiogram examinations, most                 recent 08/20/2019. Signs/Symptoms:Shortness of Breath; Risk                 Factors:Hypertension. Chronic kidney disease. GERD. Non                 productive cough.  Sonographer:    Darlina Sicilian RDCS Referring Phys: [id number] Jonnie Finner  Sonographer Comments: Image acquisition challenging due to respiratory motion and Image acquisition challenging due to COPD. IMPRESSIONS  1. Left ventricular ejection fraction, by estimation, is 55 to 60%. The left ventricle has normal function. The left ventricle has no regional wall motion abnormalities. Left ventricular diastolic parameters are consistent with Grade I diastolic dysfunction (impaired relaxation).  2. Right ventricular systolic function is normal. The right ventricular size is normal. There is normal pulmonary artery systolic pressure.  3. Left atrial size was mildly dilated.  4. The mitral valve is normal in structure. No evidence of mitral valve regurgitation. No evidence of mitral stenosis.  5. The aortic valve was not well visualized. Aortic valve regurgitation is not visualized. No aortic stenosis is present.  6. The inferior vena cava is normal in size with <50% respiratory variability, suggesting right atrial  pressure of 8 mmHg. FINDINGS  Left Ventricle: Left ventricular ejection fraction, by estimation, is 55 to 60%. The left ventricle has normal function. The left ventricle has no regional wall motion abnormalities. The left ventricular internal cavity size was normal in size. There is  no left ventricular hypertrophy. Left ventricular diastolic parameters are consistent with Grade I diastolic dysfunction  (impaired relaxation). Indeterminate filling pressures. Right Ventricle: The right ventricular size is normal. No increase in right ventricular wall thickness. Right ventricular systolic function is normal. There is normal pulmonary artery systolic pressure. The tricuspid regurgitant velocity is 2.13 m/s, and  with an assumed right atrial pressure of 8 mmHg, the estimated right ventricular systolic pressure is 96.2 mmHg. Left Atrium: Left atrial size was mildly dilated. Right Atrium: Right atrial size was normal in size. Pericardium: There is no evidence of pericardial effusion. Mitral Valve: The mitral valve is normal in structure. No evidence of mitral valve regurgitation. No evidence of mitral valve stenosis. Tricuspid Valve: The tricuspid valve is normal in structure. Tricuspid valve regurgitation is trivial. No evidence of tricuspid stenosis. Aortic Valve: The aortic valve was not well visualized. Aortic valve regurgitation is not visualized. No aortic stenosis is present. Pulmonic Valve: The pulmonic valve was normal in structure. Pulmonic valve regurgitation is not visualized. No evidence of pulmonic stenosis. Aorta: The aortic root is normal in size and structure. Venous: The inferior vena cava is normal in size with less than 50% respiratory variability, suggesting right atrial pressure of 8 mmHg. IAS/Shunts: No atrial level shunt detected by color flow Doppler.  LEFT VENTRICLE PLAX 2D LVIDd:         5.40 cm      Diastology LVIDs:         4.20 cm      LV e' medial:    8.70 cm/s LV PW:         0.85 cm      LV E/e' medial:  12.8 LV IVS:        0.80 cm      LV e' lateral:   9.90 cm/s LVOT diam:     2.00 cm      LV E/e' lateral: 11.2 LV SV:         65 LV SV Index:   39 LVOT Area:     3.14 cm  LV Volumes (MOD) LV vol d, MOD A2C: 128.0 ml LV vol d, MOD A4C: 118.0 ml LV vol s, MOD A2C: 51.4 ml LV vol s, MOD A4C: 44.3 ml LV SV MOD A2C:     76.6 ml LV SV MOD A4C:     118.0 ml LV SV MOD BP:      77.1 ml RIGHT  VENTRICLE RV S prime:     11.00 cm/s TAPSE (M-mode): 2.2 cm LEFT ATRIUM             Index LA diam:        3.30 cm 1.97 cm/m LA Vol (A2C):   55.2 ml 33.04 ml/m LA Vol (A4C):   51.4 ml 30.76 ml/m LA Biplane Vol: 54.8 ml 32.80 ml/m  AORTIC VALVE LVOT Vmax:   112.00 cm/s LVOT Vmean:  83.400 cm/s LVOT VTI:    0.208 m  AORTA Ao Root diam: 3.20 cm MITRAL VALVE                TRICUSPID VALVE MV Area (PHT): 3.21 cm     TR Peak grad:   18.1 mmHg MV Decel Time:  236 msec     TR Vmax:        213.00 cm/s MV E velocity: 111.00 cm/s MV A velocity: 107.00 cm/s  SHUNTS MV E/A ratio:  1.04         Systemic VTI:  0.21 m                             Systemic Diam: 2.00 cm Skeet Latch MD Electronically signed by Skeet Latch MD Signature Date/Time: 11/15/2021/6:50:05 PM    Final    CT CHEST WO CONTRAST  Result Date: 11/15/2021 CLINICAL DATA:  Respiratory illness/nondiagnostic radiograph. COPD. Gastroesophageal reflux disease. Hyperlipidemia. Renal insufficiency. Shortness of breath. EXAM: CT CHEST WITHOUT CONTRAST TECHNIQUE: Multidetector CT imaging of the chest was performed following the standard protocol without IV contrast. RADIATION DOSE REDUCTION: This exam was performed according to the departmental dose-optimization program which includes automated exposure control, adjustment of the mA and/or kV according to patient size and/or use of iterative reconstruction technique. COMPARISON:  Plain film of earlier today.  No comparison CT FINDINGS: Cardiovascular: Mild motion degradation throughout. Aortic atherosclerosis. Mild cardiomegaly, without pericardial effusion. Three vessel coronary artery calcification. Mediastinum/Nodes: No mediastinal or definite hilar adenopathy, given limitations of unenhanced CT. Debris in the upper esophagus on 14/2. Lungs/Pleura: No pleural fluid. Dependent bilateral lower lobe compressive atelectasis. Diffuse peribronchovascular ground-glass nodularity. Most confluent in the left lower  lobe, including on 71/8. No well-defined lobar consolidation. Upper Abdomen: Segment 2-3 subcentimeter left hepatic lobe cyst. Normal imaged portions of the spleen, stomach, adrenal glands. Musculoskeletal: No acute osseous abnormality. IMPRESSION: 1. Mildly motion degraded exam. 2. Relatively diffuse peribronchovascular ground-glass nodularity, most consistent with infection, including atypical etiologies. 3. Debris within the esophagus may represent dysmotility and/or gastroesophageal reflux. 4. Age advanced coronary artery atherosclerosis. Recommend assessment of coronary risk factors. 5.  Aortic Atherosclerosis (ICD10-I70.0). Electronically Signed   By: Abigail Miyamoto M.D.   On: 11/15/2021 12:37   DG Chest Port 1 View  Result Date: 11/15/2021 CLINICAL DATA:  Shortness of breath EXAM: PORTABLE CHEST 1 VIEW COMPARISON:  08/19/2019 FINDINGS: Borderline heart size. Congested appearance of central vessels versus airway thickening. There is no edema, consolidation, effusion, or pneumothorax. IMPRESSION: Symmetric airway thickening or vascular congestion Electronically Signed   By: Jorje Guild M.D.   On: 11/15/2021 06:29    Scheduled Meds:  folic acid  1 mg Oral Daily   ipratropium-albuterol  3 mL Nebulization TID   multivitamin with minerals  1 tablet Oral Daily   pantoprazole (PROTONIX) IV  40 mg Intravenous Q12H   pneumococcal 20-valent conjugate vaccine  0.5 mL Intramuscular Tomorrow-1000   predniSONE  40 mg Oral Q breakfast   sodium bicarbonate  1,300 mg Oral BID   thiamine  100 mg Oral Daily   Continuous Infusions:  sodium chloride     azithromycin 500 mg (11/15/21 1834)     LOS: 1 day   Darliss Cheney, MD [facility]  11/16/2021, 10:36 AM   *Please note that this is a verbal dictation therefore any spelling or grammatical errors are due to the "[place] [employer]" system interpretation.  Please page via [place] and do not message via secure chat for urgent patient care matters.  Secure chat can be used for non urgent patient care matters.  How to contact the [facility] Attending or Consulting provider [place] or covering provider during after hours [place], for this patient?  Check the care team  in [facility] and look for a) attending/consulting [facility] provider listed and b) the [facility] team listed. Page or secure chat [phone]. Log into [email or web address] and use [place]'s universal password to access. If you do not have the password, please contact the hospital operator. Locate the [facility] provider you are looking for under [facility] and page to a number that you can be directly reached. If you still have difficulty reaching the provider, please page the [facility] (Director on Call) for the Hospitalists listed on amion for assistance.

## 2021-11-16 NOTE — Progress Notes (Signed)
[facility] Progress Note   61 y.o. female with advanced CKD, nephrolithiasis s/p lithotripsy, COPD, cocaine abuse, ETOH abuse, HTN, DM, and prior homelessness p/w SOB. Has had progressive CKD over the past several years w/ Cr 4.15 on presentation today.  Profound anemia as well s/p 3U. She was previously seen @ [facility] w/ Dr. Hollie Salk in 01/2020 but has been lost to follow-up after noncompliance. Hasn't been on medications in several months.    Assessment/ Plan:   # CKD stage V - She has had progressive CKD  - No emergent indication for dialysis  - note she had an abd [place] -> small kidneys and no obstruction. - will need f/u with [facility] 4 weeks after d/c as she is moving towards dialysis in the future. Will need permanent dialysis access soon as well to avoid initiating dialysis through a catheter.   -Maintain MAP>65 for optimal renal perfusion.  - Avoid nephrotoxic medications including NSAIDs and iodinated intravenous contrast exposure unless the latter is absolutely indicated.   - Preferred narcotic agents for pain control are hydromorphone, fentanyl, and methadone. Morphine should not be used.  - Avoid Baclofen and avoid oral sodium phosphate and magnesium citrate based laxatives / bowel preps.  - Continue strict Input and Output monitoring. Will monitor the patient closely with you and intervene or adjust therapy as indicated by changes in clinical status/labs   # Anemia CKD - s/p 3 units  - CKD contributing; will start ESA also.  - Would rule out other etiologies of blood loss per primary team    # Metabolic acidosis  - would resume sodium bicarbonate. Noncompliant with home meds.    # HTN  - Controlled on current regimen    # Metabolic bone disease - Hypocalcemia improves with correction for albumin  - Phos  6.2 and intact PTH    # Medical noncompliance - noncompliance has complicated her care and I worry will push her to ESRD at some point soon  Subjective:   Cough  but nonproductive; denies fever/ chills/nausea.   Objective:   BP 139/71   Pulse 90   Temp 97.8 F (36.6 C) (Oral)   Resp 20   Ht '5\' 7"'$  (1.702 m)   Wt 57.9 kg   SpO2 96%   BMI 19.99 kg/m   Intake/Output Summary (Last 24 hours) at 11/16/2021 1401 Last data filed at 11/16/2021 0532 Gross per 24 hour  Intake 415 ml  Output 1250 ml  Net -835 ml   Weight change: -4.696 kg  Physical Exam: General: adult female appears older than stated age   27: NCAT Eyes: EOMI  Heart: S1S2 no rub Lungs: occ cough and reduced breath sounds Abdomen: soft/nt/nd Extremities: no edema appreciated  Skin: no rash on extremities exposed Neuro: alert and oriented x 3   Imaging: US Abdomen Complete  Result Date: 11/15/2021 CLINICAL DATA:  History of hepatitis C and acute renal injury EXAM: ABDOMEN ULTRASOUND COMPLETE COMPARISON:  12/31/2018 FINDINGS: Gallbladder: Gallbladder is well distended. No cholelithiasis or wall thickening is noted. Mild pericholecystic fluid is noted felt to be related to mild ascites. Common bile duct: Diameter: 4.7 mm. Liver: Nodularity is noted without focal mass. 8 mm left hepatic cyst is seen. Portal vein is patent on color Doppler imaging with normal direction of blood flow towards the liver. IVC: No abnormality visualized. Pancreas: Visualized portion unremarkable. Spleen: Size and appearance within normal limits. Fluid distended stomach Is noted adjacent to the splenic hilum. Right Kidney: Length: 7.3  cm. Echogenicity within normal limits. No mass or hydronephrosis visualized. Left Kidney: Length: 8.3 cm. Echogenicity within normal limits. No mass or hydronephrosis visualized. Abdominal aorta: No aneurysm visualized. Other findings: Bladder is within normal limits. IMPRESSION: Minimal free abdominal fluid. Small up attic cyst.  No follow-up is recommended. Electronically Signed   By: Inez Catalina M.D.   On: 11/15/2021 19:24   ECHOCARDIOGRAM COMPLETE  Result Date: 11/15/2021     ECHOCARDIOGRAM REPORT   Patient Name:   [name] Date of Exam: 11/15/2021 Medical Rec #:  [id number]      Height:       67.0 in Accession #:    [id number]     Weight:       127.6 lb Date of Birth:  [birth year]      BSA:          1.671 m Patient Age:    61 years       BP:           130/66 mmHg Patient Gender: F              HR:           85 bpm. Exam Location:  Inpatient Procedure: 2D Echo, Cardiac Doppler, Color Doppler and Intracardiac            Opacification Agent Indications:    Elevated Troponin  History:        Patient has prior history of Echocardiogram examinations, most                 recent 08/20/2019. Signs/Symptoms:Shortness of Breath; Risk                 Factors:Hypertension. Chronic kidney disease. GERD. Non                 productive cough.  Sonographer:    Darlina Sicilian RDCS Referring Phys: [id number] Jonnie Finner  Sonographer Comments: Image acquisition challenging due to respiratory motion and Image acquisition challenging due to COPD. IMPRESSIONS  1. Left ventricular ejection fraction, by estimation, is 55 to 60%. The left ventricle has normal function. The left ventricle has no regional wall motion abnormalities. Left ventricular diastolic parameters are consistent with Grade I diastolic dysfunction (impaired relaxation).  2. Right ventricular systolic function is normal. The right ventricular size is normal. There is normal pulmonary artery systolic pressure.  3. Left atrial size was mildly dilated.  4. The mitral valve is normal in structure. No evidence of mitral valve regurgitation. No evidence of mitral stenosis.  5. The aortic valve was not well visualized. Aortic valve regurgitation is not visualized. No aortic stenosis is present.  6. The inferior vena cava is normal in size with <50% respiratory variability, suggesting right atrial pressure of 8 mmHg. FINDINGS  Left Ventricle: Left ventricular ejection fraction, by estimation, is 55 to 60%. The left ventricle has normal function. The  left ventricle has no regional wall motion abnormalities. The left ventricular internal cavity size was normal in size. There is  no left ventricular hypertrophy. Left ventricular diastolic parameters are consistent with Grade I diastolic dysfunction (impaired relaxation). Indeterminate filling pressures. Right Ventricle: The right ventricular size is normal. No increase in right ventricular wall thickness. Right ventricular systolic function is normal. There is normal pulmonary artery systolic pressure. The tricuspid regurgitant velocity is 2.13 m/s, and  with an assumed right atrial pressure of 8 mmHg, the estimated right ventricular systolic pressure is 99.3 mmHg. Left Atrium: Left  atrial size was mildly dilated. Right Atrium: Right atrial size was normal in size. Pericardium: There is no evidence of pericardial effusion. Mitral Valve: The mitral valve is normal in structure. No evidence of mitral valve regurgitation. No evidence of mitral valve stenosis. Tricuspid Valve: The tricuspid valve is normal in structure. Tricuspid valve regurgitation is trivial. No evidence of tricuspid stenosis. Aortic Valve: The aortic valve was not well visualized. Aortic valve regurgitation is not visualized. No aortic stenosis is present. Pulmonic Valve: The pulmonic valve was normal in structure. Pulmonic valve regurgitation is not visualized. No evidence of pulmonic stenosis. Aorta: The aortic root is normal in size and structure. Venous: The inferior vena cava is normal in size with less than 50% respiratory variability, suggesting right atrial pressure of 8 mmHg. IAS/Shunts: No atrial level shunt detected by color flow Doppler.  LEFT VENTRICLE PLAX 2D LVIDd:         5.40 cm      Diastology LVIDs:         4.20 cm      LV e' medial:    8.70 cm/s LV PW:         0.85 cm      LV E/e' medial:  12.8 LV IVS:        0.80 cm      LV e' lateral:   9.90 cm/s LVOT diam:     2.00 cm      LV E/e' lateral: 11.2 LV SV:         65 LV SV Index:    39 LVOT Area:     3.14 cm  LV Volumes (MOD) LV vol d, MOD A2C: 128.0 ml LV vol d, MOD A4C: 118.0 ml LV vol s, MOD A2C: 51.4 ml LV vol s, MOD A4C: 44.3 ml LV SV MOD A2C:     76.6 ml LV SV MOD A4C:     118.0 ml LV SV MOD BP:      77.1 ml RIGHT VENTRICLE RV S prime:     11.00 cm/s TAPSE (M-mode): 2.2 cm LEFT ATRIUM             Index LA diam:        3.30 cm 1.97 cm/m LA Vol (A2C):   55.2 ml 33.04 ml/m LA Vol (A4C):   51.4 ml 30.76 ml/m LA Biplane Vol: 54.8 ml 32.80 ml/m  AORTIC VALVE LVOT Vmax:   112.00 cm/s LVOT Vmean:  83.400 cm/s LVOT VTI:    0.208 m  AORTA Ao Root diam: 3.20 cm MITRAL VALVE                TRICUSPID VALVE MV Area (PHT): 3.21 cm     TR Peak grad:   18.1 mmHg MV Decel Time: 236 msec     TR Vmax:        213.00 cm/s MV E velocity: 111.00 cm/s MV A velocity: 107.00 cm/s  SHUNTS MV E/A ratio:  1.04         Systemic VTI:  0.21 m                             Systemic Diam: 2.00 cm Skeet Latch MD Electronically signed by Skeet Latch MD Signature Date/Time: 11/15/2021/6:50:05 PM    Final    CT CHEST WO CONTRAST  Result Date: 11/15/2021 CLINICAL DATA:  Respiratory illness/nondiagnostic radiograph. COPD. Gastroesophageal reflux disease. Hyperlipidemia. Renal insufficiency. Shortness of breath. EXAM: CT  CHEST WITHOUT CONTRAST TECHNIQUE: Multidetector CT imaging of the chest was performed following the standard protocol without IV contrast. RADIATION DOSE REDUCTION: This exam was performed according to the departmental dose-optimization program which includes automated exposure control, adjustment of the mA and/or kV according to patient size and/or use of iterative reconstruction technique. COMPARISON:  Plain film of earlier today.  No comparison CT FINDINGS: Cardiovascular: Mild motion degradation throughout. Aortic atherosclerosis. Mild cardiomegaly, without pericardial effusion. Three vessel coronary artery calcification. Mediastinum/Nodes: No mediastinal or definite hilar adenopathy, given  limitations of unenhanced CT. Debris in the upper esophagus on 14/2. Lungs/Pleura: No pleural fluid. Dependent bilateral lower lobe compressive atelectasis. Diffuse peribronchovascular ground-glass nodularity. Most confluent in the left lower lobe, including on 71/8. No well-defined lobar consolidation. Upper Abdomen: Segment 2-3 subcentimeter left hepatic lobe cyst. Normal imaged portions of the spleen, stomach, adrenal glands. Musculoskeletal: No acute osseous abnormality. IMPRESSION: 1. Mildly motion degraded exam. 2. Relatively diffuse peribronchovascular ground-glass nodularity, most consistent with infection, including atypical etiologies. 3. Debris within the esophagus may represent dysmotility and/or gastroesophageal reflux. 4. Age advanced coronary artery atherosclerosis. Recommend assessment of coronary risk factors. 5.  Aortic Atherosclerosis (ICD10-I70.0). Electronically Signed   By: Abigail Miyamoto M.D.   On: 11/15/2021 12:37   DG Chest Port 1 View  Result Date: 11/15/2021 CLINICAL DATA:  Shortness of breath EXAM: PORTABLE CHEST 1 VIEW COMPARISON:  08/19/2019 FINDINGS: Borderline heart size. Congested appearance of central vessels versus airway thickening. There is no edema, consolidation, effusion, or pneumothorax. IMPRESSION: Symmetric airway thickening or vascular congestion Electronically Signed   By: Jorje Guild M.D.   On: 11/15/2021 06:29    Labs: BMET Recent Labs  Lab 11/15/21 0528 11/16/21 0012  NA 135 135  K 4.1 4.6  CL 112* 106  CO2 15* 14*  GLUCOSE 115* 113*  BUN 40* 43*  CREATININE 4.15* 4.46*  CALCIUM 7.7* 8.1*  PHOS  --  6.2*   CBC Recent Labs  Lab 11/15/21 0528 11/15/21 1103 11/15/21 1747 11/16/21 0012 11/16/21 0602 11/16/21 1104  WBC 9.2  --   --  9.2 9.2 10.0  NEUTROABS 5.9  --   --   --   --  9.2*  HGB 4.5*   < > 6.6* 7.6* 8.4* 8.7*  HCT 15.1*   < > 20.5* 23.9* 26.1* 26.7*  MCV 95.6  --   --  90.2 89.1 89.3  PLT 368  --   --  326 328 356   < > =  values in this interval not displayed.    Medications:     feeding supplement  237 mL Oral W96P   folic acid  1 mg Oral Daily   ipratropium-albuterol  3 mL Nebulization TID   multivitamin with minerals  1 tablet Oral Daily   pantoprazole (PROTONIX) IV  40 mg Intravenous Q12H   predniSONE  40 mg Oral Q breakfast   sodium bicarbonate  1,300 mg Oral BID   thiamine  100 mg Oral Daily      Otelia Santee, MD 11/16/2021, 2:01 PM

## 2021-11-16 NOTE — Evaluation (Signed)
Clinical/Bedside Swallow Evaluation Patient Details  Name: [name] MRN: [id number] Date of Birth: Nov 28, 1960  Today's Date: 11/16/2021 Time: SLP Start Time (ACUTE ONLY): 1150 SLP Stop Time (ACUTE ONLY): 1200 SLP Time Calculation (min) (ACUTE ONLY): 10 min  Past Medical History:  Past Medical History:  Diagnosis Date   Alcohol abuse    Allergy    Anxiety    Arthritis    Asthma    Cannabis abuse    Cocaine abuse (HCC)    COPD (chronic obstructive pulmonary disease) (HCC)    Depression    Drug addiction ([place])    GERD (gastroesophageal reflux disease)    Heart murmur    Homelessness    Hyperlipidemia    Hypertension    pt stated "every once in a while BP will be high but has not been prescribed medication for HTN.    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) ([place]) 02/22/2016   Past Surgical History:  Past Surgical History:  Procedure Laterality Date   BIOPSY  01/01/2019   Procedure: BIOPSY;  Surgeon: Jerene Bears, MD;  Location: [facility] ENDOSCOPY;  Service: Endoscopy;;   [place]   COLONOSCOPY  11/07/2020   2018   CYSTOSCOPY W/ URETERAL STENT PLACEMENT Left 08/01/2018   Procedure: CYSTOSCOPY WITH RETROGRADE PYELOGRAM/URETERAL STENT PLACEMENT;  Surgeon: Lucas Mallow, MD;  Location: [facility] ORS;  Service: Urology;  Laterality: Left;   CYSTOSCOPY WITH RETROGRADE PYELOGRAM, URETEROSCOPY AND STENT PLACEMENT Left 01/15/2019   Procedure: CYSTOSCOPY WITH RETROGRADE PYELOGRAM, URETEROSCOPY AND STENT PLACEMENT;  Surgeon: Lucas Mallow, MD;  Location: [facility] ORS;  Service: Urology;  Laterality: Left;   ESOPHAGOGASTRODUODENOSCOPY (EGD) WITH PROPOFOL N/A 01/01/2019   Procedure: ESOPHAGOGASTRODUODENOSCOPY (EGD) WITH PROPOFOL;  Surgeon: Jerene Bears, MD;  Location: [facility] ENDOSCOPY;  Service: Endoscopy;  Laterality: N/A;   ESOPHAGOGASTRODUODENOSCOPY (EGD)  WITH PROPOFOL N/A 08/21/2019   Procedure: ESOPHAGOGASTRODUODENOSCOPY (EGD) WITH PROPOFOL;  Surgeon: Mauri Pole, MD;  Location: [facility] ENDOSCOPY;  Service: Endoscopy;  Laterality: N/A;   FRACTURE SURGERY Left 2011   arm   UPPER GASTROINTESTINAL ENDOSCOPY     HPI:  61 yo female adm to [facility] with shortness of breath and found to have dark stools. Concern for GI bleed present.  Plan is for endoscopy.  Pt CKD, Hep C, ETOH, duodenal ulcer, COPD  steroid, esophagitis - Oct 2020, GERD.  Pt denies dysphagia in mouth and throat.  Per Chest CT scan: 11/15/2021  Debris within the esophagus may represent dysmotility and/or gastroesophageal reflux.    Assessment / Plan / Recommendation  Clinical Impression  Patient presents with functional oropharyngeal swallow ability based on clinical swallow evaluation.  No overt indications of aspiration with po observed including applesauce, crackers and water. Pt is dysphonic -and attributes this to smoking and cough.  She has h/o reflux and CT chest showed esophageal debris-? reflux or dysmotility per report.  Provided her with esophageal precautions given findings using teach back. She does admit she senses food in esophagus at times and conducts dry swallows or coughs to clear it. Advised small frequent meals. No SLP follow up indicated. Thanks for this consult. SLP Visit Diagnosis: Dysphagia, unspecified (R13.10)    Aspiration Risk  No limitations    Diet Recommendation Regular;Thin liquid   Liquid Administration via: Cup;Straw Medication Administration: Whole meds with liquid Supervision:  Patient able to self feed Compensations: Minimize environmental distractions;Slow rate;Small sips/bites Postural Changes: Seated upright at 90 degrees;Remain upright for at least 30 minutes after po intake    Other  Recommendations Oral Care Recommendations: Oral care BID    Recommendations for follow up therapy are one component of a multi-disciplinary discharge planning  process, led by the attending physician.  Recommendations may be updated based on patient status, additional functional criteria and insurance authorization.  Follow up Recommendations No SLP follow up      Assistance Recommended at Discharge None  Functional Status Assessment Patient has not had a recent decline in their functional status  Frequency and Duration     N/a       Prognosis    N/a    Swallow Study   General Date of Onset: 11/16/21 HPI: 61 yo female adm [facility] with shortness of breath and found to have dark stools. Concern for GI bleed present.  Plan is for endoscopy.  Pt CKD, Hep C, ETOH, duodenal ulcer, COPD  steroid, esophagitis - Oct 2020, GERD.  Pt denies dysphagia in mouth and throat.  Per Chest CT scan: 11/15/2021  Debris within the esophagus may represent dysmotility and/or gastroesophageal reflux. Type of Study: Bedside Swallow Evaluation Diet Prior to this Study: Regular;Thin liquids Temperature Spikes Noted: No Respiratory Status: Room air History of Recent Intubation: No Behavior/Cognition: Alert;Cooperative;Pleasant mood Oral Cavity Assessment: Within Functional Limits Oral Care Completed by SLP: No Oral Cavity - Dentition: Edentulous Vision: Functional for self-feeding Self-Feeding Abilities: Able to feed self Patient Positioning: Upright in bed Baseline Vocal Quality: Hoarse Volitional Cough: Congested Volitional Swallow: Able to elicit    Oral/Motor/Sensory Function Overall Oral Motor/Sensory Function: Within functional limits   Ice Chips Ice chips: Not tested   Thin Liquid Thin Liquid: Within functional limits    Nectar Thick Nectar Thick Liquid: Not tested   Honey Thick Honey Thick Liquid: Not tested   Puree Puree: Within functional limits Presentation: Self Fed;Spoon   Solid     Solid: Within functional limits Presentation: Self Fredirick Lathe 11/16/2021,12:12 PM  Kathleen Lime, MS [facility] SLP [place] Office  [phone] Pager [phone]
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
## 2021-11-17 ENCOUNTER — Inpatient Hospital Stay (HOSPITAL_COMMUNITY): Payer: Medicare Other | Admitting: Certified Registered Nurse Anesthetist

## 2021-11-17 ENCOUNTER — Encounter (HOSPITAL_COMMUNITY)
Admission: EM | Disposition: A | Discharge status: Home / Self Care | Payer: Self-pay | Source: Home / Self Care | Attending: Internal Medicine

## 2021-11-17 DIAGNOSIS — J441 Chronic obstructive pulmonary disease with (acute) exacerbation: Secondary | ICD-10-CM

## 2021-11-17 DIAGNOSIS — K2970 Gastritis, unspecified, without bleeding: Secondary | ICD-10-CM

## 2021-11-17 DIAGNOSIS — F17210 Nicotine dependence, cigarettes, uncomplicated: Secondary | ICD-10-CM

## 2021-11-17 DIAGNOSIS — D509 Iron deficiency anemia, unspecified: Secondary | ICD-10-CM

## 2021-11-17 DIAGNOSIS — J449 Chronic obstructive pulmonary disease, unspecified: Secondary | ICD-10-CM

## 2021-11-17 DIAGNOSIS — I10 Essential (primary) hypertension: Secondary | ICD-10-CM

## 2021-11-17 DIAGNOSIS — B182 Chronic viral hepatitis C: Secondary | ICD-10-CM

## 2021-11-17 DIAGNOSIS — F418 Other specified anxiety disorders: Secondary | ICD-10-CM

## 2021-11-17 HISTORY — PX: BIOPSY: SHX5522

## 2021-11-17 HISTORY — PX: ESOPHAGOGASTRODUODENOSCOPY (EGD) WITH PROPOFOL: SHX5813

## 2021-11-17 LAB — BASIC METABOLIC PANEL
Anion gap: 6 (ref 5–15)
BUN: 57 mg/dL — ABNORMAL HIGH (ref 8–23)
CO2: 22 mmol/L (ref 22–32)
Calcium: 7.8 mg/dL — ABNORMAL LOW (ref 8.9–10.3)
Chloride: 112 mmol/L — ABNORMAL HIGH (ref 98–111)
Creatinine, Ser: 4.43 mg/dL — ABNORMAL HIGH (ref 0.44–1.00)
GFR, Estimated: 11 mL/min — ABNORMAL LOW (ref >60)
Glucose, Bld: 121 mg/dL — ABNORMAL HIGH (ref 70–99)
Potassium: 4.8 mmol/L (ref 3.5–5.1)
Sodium: 140 mmol/L (ref 135–145)

## 2021-11-17 LAB — PARATHYROID HORMONE, INTACT (NO CA): PTH: 211 pg/mL — ABNORMAL HIGH (ref 15–65)

## 2021-11-17 SURGERY — ESOPHAGOGASTRODUODENOSCOPY (EGD) WITH PROPOFOL
Anesthesia: Monitor Anesthesia Care

## 2021-11-17 MED ORDER — AZITHROMYCIN 250 MG PO TABS
500.0000 mg | ORAL_TABLET | Freq: Every day | ORAL | Status: DC
  Administered 2021-11-17 – 2021-11-18 (×2): 500 mg via ORAL
  Filled 2021-11-17 (×2): qty 2

## 2021-11-17 MED ORDER — MOMETASONE FURO-FORMOTEROL FUM 200-5 MCG/ACT IN AERO
2.0000 | INHALATION_SPRAY | Freq: Two times a day (BID) | RESPIRATORY_TRACT | Status: DC
  Administered 2021-11-18: 2 via RESPIRATORY_TRACT
  Filled 2021-11-17: qty 8.8

## 2021-11-17 MED ORDER — HYDROCOD POLI-CHLORPHE POLI ER 10-8 MG/5ML PO SUER
5.0000 mL | Freq: Two times a day (BID) | ORAL | Status: DC
  Administered 2021-11-17 – 2021-11-18 (×3): 5 mL via ORAL
  Filled 2021-11-17 (×3): qty 5

## 2021-11-17 MED ORDER — PROPOFOL 500 MG/50ML IV EMUL
INTRAVENOUS | Status: DC | PRN
  Administered 2021-11-17: 150 ug/kg/min via INTRAVENOUS

## 2021-11-17 MED ORDER — LACTATED RINGERS IV SOLN: INTRAVENOUS | Status: DC | PRN

## 2021-11-17 MED ORDER — SODIUM CHLORIDE 0.9 % IV SOLN
250.0000 mg | Freq: Every day | INTRAVENOUS | Status: DC
  Administered 2021-11-17: 250 mg via INTRAVENOUS
  Filled 2021-11-17 (×2): qty 20

## 2021-11-17 MED ORDER — GUAIFENESIN-DM 100-10 MG/5ML PO SYRP
10.0000 mL | ORAL_SOLUTION | ORAL | Status: DC | PRN
  Administered 2021-11-17 – 2021-11-18 (×3): 10 mL via ORAL
  Filled 2021-11-17 (×3): qty 10

## 2021-11-17 MED ORDER — PROPOFOL 10 MG/ML IV BOLUS
INTRAVENOUS | Status: DC | PRN
  Administered 2021-11-17: 20 mg via INTRAVENOUS
  Administered 2021-11-17: 10 mg via INTRAVENOUS

## 2021-11-17 MED ORDER — LIDOCAINE 2% (20 MG/ML) 5 ML SYRINGE
INTRAMUSCULAR | Status: DC | PRN
  Administered 2021-11-17: 100 mg via INTRAVENOUS

## 2021-11-17 SURGICAL SUPPLY — 15 items

## 2021-11-17 NOTE — Assessment & Plan Note (Addendum)
Patient with improving in dyspnea, continue to have dry cough. Symptoms better with bronchodilator therapy.  Plan to continue with oxymetry monitoring Bronchodilator therapy and inhaled corticosteroids  Discontiunue systemic steroid therapy Azithromycin for 5 days.  Chest CT personally reviewed with no features of pneumonia, (ruled out) discontinue ceftriaxone.

## 2021-11-17 NOTE — Transfer of Care (Signed)
Immediate Anesthesia Transfer of Care Note  Patient: [name]  Procedure(s) Performed: ESOPHAGOGASTRODUODENOSCOPY (EGD) WITH PROPOFOL BIOPSY  Patient Location: PACU  Anesthesia Type:MAC  Level of Consciousness: awake  Airway & Oxygen Therapy: Patient Spontanous Breathing and Patient connected to face mask oxygen  Post-op Assessment: Report given to RN and Post -op Vital signs reviewed and stable  Post vital signs: Reviewed and stable  Last Vitals:  Vitals Value Taken Time  BP 125/60 11/17/21 0825  Temp    Pulse 87 11/17/21 0827  Resp 21 11/17/21 0827  SpO2 97 % 11/17/21 0827  Vitals shown include unvalidated device data.  Last Pain:  Vitals:   11/17/21 0736  TempSrc: Temporal  PainSc: 0-No pain      Patients Stated Pain Goal: 3 (28/20/60 1561)  Complications: No notable events documented.

## 2021-11-17 NOTE — Anesthesia Postprocedure Evaluation (Signed)
Anesthesia Post Note  Patient: [name]  Procedure(s) Performed: ESOPHAGOGASTRODUODENOSCOPY (EGD) WITH PROPOFOL BIOPSY     Patient location during evaluation: PACU Anesthesia Type: MAC Level of consciousness: awake and alert Pain management: pain level controlled Vital Signs Assessment: post-procedure vital signs reviewed and stable Respiratory status: spontaneous breathing, nonlabored ventilation and respiratory function stable Cardiovascular status: blood pressure returned to baseline and stable Postop Assessment: no apparent nausea or vomiting Anesthetic complications: no   No notable events documented.  Last Vitals:  Vitals:   11/17/21 0830 11/17/21 0843  BP: (!) 119/97 119/75  Pulse: 80 78  Resp: 17 16  Temp:  (!) 36.3 C  SpO2: 97% 96%    Last Pain:  Vitals:   11/17/21 0843  TempSrc:   PainSc: 0-No pain                 Pervis Hocking

## 2021-11-17 NOTE — Evaluation (Signed)
Physical Therapy Evaluation Patient Details Name: [name] MRN: [id number] DOB: 1961/01/12 Today's Date: 11/17/2021  History of Present Illness  Mrs. Stieg was admitted to the hospital with the working diagnosis of upper GI bleed, and acute blood loss anemia in the setting of COPD exacerbation.  61 yo female with the past medical history of CKD stage 4, chronic hep c, duodenal ulcer, hypertension and COPD who presented with respiratory distress.  Clinical Impression  Pt admitted with above diagnosis.  Pt currently with functional limitations due to the deficits listed below (see PT Problem List). Pt will benefit from skilled PT to increase their independence and safety with mobility to allow discharge to the venue listed below.  Pt instructed in LE strengthening she can do in bed/chair, which she states she knows.  She is likely close to her baseline level.  Will follow acutely, but no PT needs after d/c from acute care.         Recommendations for follow up therapy are one component of a multi-disciplinary discharge planning process, led by the attending physician.  Recommendations may be updated based on patient status, additional functional criteria and insurance authorization.  Follow Up Recommendations No PT follow up      Assistance Recommended at Discharge PRN  Patient can return home with the following  A little help with walking and/or transfers    Equipment Recommendations None recommended by PT  Recommendations for Other Services       Functional Status Assessment Patient has had a recent decline in their functional status and demonstrates the ability to make significant improvements in function in a reasonable and predictable amount of time.     Precautions / Restrictions Precautions Precautions: Fall Restrictions Weight Bearing Restrictions: No      Mobility  Bed Mobility Overal bed mobility: Modified Independent                  Transfers Overall  transfer level: Modified independent                      Ambulation/Gait Ambulation/Gait assistance: Min guard Gait Distance (Feet): 125 Feet Assistive device: IV Pole Gait Pattern/deviations: Step-through pattern, Knee flexed in stance - right, Knee flexed in stance - left       General Gait Details: No LOB or buckeling noted, but generalized weakness noted with gait. o2 100% on RA with gait.  Stairs            Wheelchair Mobility    Modified Rankin (Stroke Patients Only)       Balance Overall balance assessment: History of Falls, Mild deficits observed, not formally tested                                           Pertinent Vitals/Pain Pain Assessment Pain Assessment: No/denies pain    Home Living Family/patient expects to be discharged to:: Group home Living Arrangements: Alone                 Additional Comments: Has a room in a boarding house    Prior Function Prior Level of Function : Independent/Modified Independent             Mobility Comments: 2 falls in last 2.5 weeks. One knee gave out when stepping up on a curb.       Hand Dominance  Extremity/Trunk Assessment   Upper Extremity Assessment Upper Extremity Assessment: Overall WFL for tasks assessed    Lower Extremity Assessment Lower Extremity Assessment: Overall WFL for tasks assessed;Generalized weakness       Communication   Communication: No difficulties  Cognition Arousal/Alertness: Awake/alert Behavior During Therapy: Flat affect Overall Cognitive Status: Within Functional Limits for tasks assessed                                          General Comments      Exercises     Assessment/Plan    PT Assessment Patient needs continued PT services  PT Problem List Decreased strength;Decreased activity tolerance;Decreased balance;Decreased mobility       PT Treatment Interventions DME instruction;Gait  training;Functional mobility training;Balance training;Therapeutic exercise;Therapeutic activities    PT Goals (Current goals can be found in the Care Plan section)  Acute Rehab PT Goals Patient Stated Goal: to go home PT Goal Formulation: With patient Time For Goal Achievement: 12/01/21 Potential to Achieve Goals: Good    Frequency Min 3X/week     Co-evaluation               AM-PAC PT "6 Clicks" Mobility  Outcome Measure Help needed turning from your back to your side while in a flat bed without using bedrails?: None Help needed moving from lying on your back to sitting on the side of a flat bed without using bedrails?: None Help needed moving to and from a bed to a chair (including a wheelchair)?: None Help needed standing up from a chair using your arms (e.g., wheelchair or bedside chair)?: None Help needed to walk in hospital room?: A Little Help needed climbing 3-5 steps with a railing? : A Little 6 Click Score: 22    End of Session Equipment Utilized During Treatment: Gait belt Activity Tolerance: Patient tolerated treatment well Patient left: in chair;with call bell/phone within reach;with chair alarm set Nurse Communication: Mobility status PT Visit Diagnosis: Muscle weakness (generalized) (M62.81);Difficulty in walking, not elsewhere classified (R26.2)    Time: 6144-3154 PT Time Calculation (min) (ACUTE ONLY): 18 min   Charges:   PT Evaluation $PT Eval Moderate Complexity: 1 Mod          Lashawn Bromwell L. Tamala Julian, PT  11/17/2021   Galen Manila 11/17/2021, 1:56 PM

## 2021-11-17 NOTE — Assessment & Plan Note (Signed)
Plan to follow up as outpatient.  

## 2021-11-17 NOTE — Assessment & Plan Note (Signed)
Continue with statin therapy.  ?

## 2021-11-17 NOTE — Anesthesia Preprocedure Evaluation (Addendum)
Anesthesia Evaluation  Patient identified by MRN, date of birth, ID band Patient awake    Reviewed: Allergy & Precautions, NPO status , Patient's Chart, lab work & pertinent test results  Airway Mallampati: IV  TM Distance: >3 FB Neck ROM: Full    Dental no notable dental hx. (+) Edentulous Upper, Edentulous Lower   Pulmonary asthma , COPD,  COPD inhaler, Current Smoker and Patient abstained from smoking.,  Current [place]-time smoker, has had progressively worse productive cough- being treated w/ inhalers by primary team    + decreased breath sounds      Cardiovascular hypertension (165/87 preop), Pt. on medications +CHF (grade 1 diastolic dysfunction)  Normal cardiovascular exam Rhythm:Regular Rate:Normal  Echo 11/15/21: 1. Left ventricular ejection fraction, by estimation, is 55 to 60%. The  left ventricle has normal function. The left ventricle has no regional  wall motion abnormalities. Left ventricular diastolic parameters are  consistent with Grade I diastolic  dysfunction (impaired relaxation).  2. Right ventricular systolic function is normal. The right ventricular  size is normal. There is normal pulmonary artery systolic pressure.  3. Left atrial size was mildly dilated.  4. The mitral valve is normal in structure. No evidence of mitral valve  regurgitation. No evidence of mitral stenosis.  5. The aortic valve was not well visualized. Aortic valve regurgitation  is not visualized. No aortic stenosis is present.  6. The inferior vena cava is normal in size with <50% respiratory  variability, suggesting right atrial pressure of 8 mmHg.    Neuro/Psych PSYCHIATRIC DISORDERS Anxiety Depression negative neurological ROS     GI/Hepatic PUD, GERD  Controlled and Medicated,(+)     substance abuse (hx polysubstance abuse, pt states she quit a while ago. no UDS done this admission)  alcohol use, cocaine use and marijuana  use, ABLA- possible GIB, hx PUD  Last EGD 08/2019   Endo/Other  negative endocrine ROSdiabetes  Renal/GU ESRFRenal diseaseESRD not yet on HD  K 4.8 this AM  negative genitourinary   Musculoskeletal  (+) Arthritis , Osteoarthritis,    Abdominal   Peds  Hematology  (+) Blood dyscrasia, anemia , Hb 8.1   Anesthesia Other Findings   Reproductive/Obstetrics negative OB ROS                            Anesthesia Physical Anesthesia Plan  ASA: 3  Anesthesia Plan: MAC   Post-op Pain Management:    Induction:   PONV Risk Score and Plan: 2 and Propofol infusion and TIVA  Airway Management Planned: Natural Airway and Simple Face Mask  Additional Equipment: None  Intra-op Plan:   Post-operative Plan:   Informed Consent: I have reviewed the patients History and Physical, chart, labs and discussed the procedure including the risks, benefits and alternatives for the proposed anesthesia with the patient or authorized representative who has indicated his/her understanding and acceptance.       Plan Discussed with: CRNA  Anesthesia Plan Comments: (Last EGD 2021 under mac, no issues)        Anesthesia Quick Evaluation

## 2021-11-17 NOTE — Assessment & Plan Note (Addendum)
CKD stage IV. Hypocalcemia. Non anion gap metabolic acidosis.    At the time of her discharge her renal function is stable with serum cr at 4,16 with K at 3,9 and serum bicarbonate at 16  Anion gap 12.   Patient will need close follow up as outpatient with nephrology. Will add as needed furosemide 40 mg daily for edema.   Anemia of chronic renal disease, patient received EPO during her hospitalization.

## 2021-11-17 NOTE — Op Note (Signed)
[facility] Patient Name: [name] Procedure Date: 11/17/2021 MRN: [id number] Attending MD: Jackquline Denmark , MD Date of Birth: 06-Dec-1960 CSN: [id number] Age: 61 Admit Type: Inpatient Procedure:                Upper GI endoscopy Indications:              Profound symptomatic acute on chronic anemia                            without overt GI bleeding. Hb 4.5 on adm (baseline                            8.4 07/2021) s/p 3U to 8.1. H/O NSAID use. Neg colon                            except for SSA 10/2020 (note fair prep despite 2 day                            prep). [place] neg except for mildy nodular liver. No                            pHTN. Pt with advanced CKD (GFR 12 ml/min) Providers:                Jackquline Denmark, MD, Grace Isaac, RN, Benetta Spar, Technician Referring MD:              Medicines:                Monitored Anesthesia Care Complications:            No immediate complications. Estimated Blood Loss:     Estimated blood loss: none. Procedure:                Pre-Anesthesia Assessment:                           - Prior to the procedure, a History and Physical                            was performed, and patient medications and                            allergies were reviewed. The patient's tolerance of                            previous anesthesia was also reviewed. The risks                            and benefits of the procedure and the sedation                            options and risks were discussed with the patient.  All questions were answered, and informed consent                            was obtained. Prior Anticoagulants: The patient has                            taken no previous anticoagulant or antiplatelet                            agents. ASA Grade Assessment: III - A patient with                            severe systemic disease. After reviewing the risks                             and benefits, the patient was deemed in                            satisfactory condition to undergo the procedure.                           After obtaining informed consent, the endoscope was                            passed under direct vision. Throughout the                            procedure, the patient's blood pressure, pulse, and                            oxygen saturations were monitored continuously. The                            [id number]-H190 ([id number]) Olympus endoscope was introduced                            through the mouth, and advanced to the second part                            of duodenum. The upper GI endoscopy was                            accomplished without difficulty. The patient                            tolerated the procedure well. Scope In: Scope Out: Findings:      The examined esophagus was normal with well-defined Z-line at 36 cm,       examined by NBI. No esophageal varices.      Diffuse mild inflammation characterized by erythema was found in the       gastric body and in the gastric antrum. Biopsies were taken with a cold       forceps for histology.      The examined duodenum was normal. Biopsies for histology  were taken with       a cold forceps for evaluation of celiac disease. Impression:               - Mild gastritis.                           - Otherwise normal EGD. No UGI bleeding. Moderate Sedation:      Not Applicable - Patient had care per Anesthesia. Recommendation:           - Patient has a contact number available for                            emergencies. The signs and symptoms of potential                            delayed complications were discussed with the                            patient. Return to normal activities tomorrow.                            Written discharge instructions were provided to the                            patient.                           - Resume previous diet.                           -  Continue present medications including Protonix                            40 mg p.o. daily.                           - Avoid nonsteroidals                           - Await pathology results.                           - FU GI with Dr Loletha Carrow as outpt for consideration of                            possible VCE/further eval in 4-6 weeks.                           - On ultrasound liver is mildly nodular and may                            suggest early cirrhosis (d/t HCV). No portal                            hypertension. Follow HCV ultra quant/genotype. If                            +,  ID consultation as outpt                           - Do check Hemoccult if she has any further BMs.                           - Will sign off for now.                           - The findings and recommendations were discussed                            with the patient. Procedure Code(s):        --- Professional ---                           [phone], Esophagogastroduodenoscopy, flexible,                            transoral; with biopsy, single or multiple Diagnosis Code(s):        --- Professional ---                           K29.70, Gastritis, unspecified, without bleeding                           D50.9, Iron deficiency anemia, unspecified CPT copyright 2019 [employer]. All rights reserved. The codes documented in this report are preliminary and upon coder review may  be revised to meet current compliance requirements. Jackquline Denmark, MD 11/17/2021 8:32:47 AM This report has been signed electronically. Number of Addenda: 0

## 2021-11-17 NOTE — Hospital Course (Signed)
Mrs. Retz was admitted to the hospital with the working diagnosis of upper GI bleed, and acute blood loss anemia in the setting of COPD exacerbation.   61 yo female with the past medical history of CKD stage 4, chronic hep c, duodenal ulcer, hypertension and COPD who presented with respiratory distress. Reported several days of dyspnea, with non productive cough. Symptoms persistent and worsening despite use of inhalers at home, prompting her to come to the hospital. Positive melena. On her initial physical examination her blood pressure was 113/64. HR 95, RR 19 and 02 saturation 95%, lungs with no wheezing, heart with S1 and S2 present and rhythmic, abdomen with no distention and no lower extremity edema.   ABG 7.28/27/121/12.7/ 99%  Na 135 K 4.6  Cl 106 bicarbonate 14, glucose 113 bun 43 cr 4.46  High sensitive troponin 57, 68, 70  Iron 14, TIBC 449, transferrin saturation 3, ferritin 11  Wbc 9,2 hgb 4,5 plt 368  Sars covid 19 negative   Chest radiograph with hyperinflation with no infiltrates. CT chest with no frank infiltrates.   EKG 105 bpm, normal axis, normal intervals, sinus rhythm with no significant ST segment or T wave changes.   Patient had 3 units PRBC transfusion with good toleration.  She was placed on systemic corticosteroids and bronchodilator therapy.   09/02 EGD mild gastritis. Recommendations to continue with pantoprazole.  09/03 renal function and oxygenation stable, plan to discharge home and follow up as outpatient with nephrology and primary care.

## 2021-11-17 NOTE — Progress Notes (Signed)
[facility] Progress Note   61 y.o. female with advanced CKD, nephrolithiasis s/p lithotripsy, COPD, cocaine abuse, ETOH abuse, HTN, DM, and prior homelessness p/w SOB. Has had progressive CKD over the past several years w/ Cr 4.15 on presentation today.  Profound anemia as well s/p 3U. She was previously seen @ [facility] w/ Dr. Hollie Salk in 01/2020 but has been lost to follow-up after noncompliance. Hasn't been on medications in several months.    Assessment/ Plan:   # CKD stage V - She has had progressive CKD  - No emergent indication for dialysis  - note she had an abd [place] -> small kidneys and no obstruction. - will need f/u with [facility] 4 weeks after d/c as she is moving towards dialysis in the future. Will need permanent dialysis access soon as well to avoid initiating dialysis through a catheter. A lot of time spent educating each day; obviously ESRD with TIW HD will be difficult and pt overwhelmed. Fortunately no decisions to be made at this time and continued discussions + education will be helpful when she's discharged. I reenforced the need to f/u with [facility] given how advanced her CKD is.   -Maintain MAP>65 for optimal renal perfusion.  - Avoid nephrotoxic medications including NSAIDs and iodinated intravenous contrast exposure unless the latter is absolutely indicated.   - Preferred narcotic agents for pain control are hydromorphone, fentanyl, and methadone. Morphine should not be used.  - Avoid Baclofen and avoid oral sodium phosphate and magnesium citrate based laxatives / bowel preps.  - Continue strict Input and Output monitoring. Will monitor the patient closely with you and intervene or adjust therapy as indicated by changes in clinical status/labs   # Anemia CKD - s/p 3 units  - CKD contributing.  - Would rule out other etiologies of blood loss per primary team -> gastritis on EGD.   # Metabolic acidosis  - cont sodium bicarbonate (fortunately responding). Noncompliant with  home meds.    # HTN  - Controlled on current regimen    # Metabolic bone disease - Hypocalcemia improves with correction for albumin  - Phos  6.2 and intact PTH    # Medical noncompliance - noncompliance has complicated her care and I worry will push her to ESRD at some point soon  Subjective:   Cough but nonproductive; denies fever/ chills/nausea. Aches all over.   Objective:   BP 119/75 (BP Location: Right Arm)   Pulse 78   Temp (!) 97.4 F (36.3 C)   Resp 16   Ht '5\' 7"'$  (1.702 m)   Wt 57.9 kg   SpO2 100%   BMI 19.99 kg/m   Intake/Output Summary (Last 24 hours) at 11/17/2021 1233 Last data filed at 11/17/2021 1208 Gross per 24 hour  Intake 1587.68 ml  Output 300 ml  Net 1287.68 ml   Weight change:   Physical Exam: General: adult female appears older than stated age   43: NCAT Eyes: EOMI  Heart: S1S2 no rub Lungs: occ cough and reduced breath sounds Abdomen: soft/nt/nd Extremities: no edema appreciated  Skin: no rash on extremities exposed Neuro: alert and oriented x 3   Imaging: US Abdomen Complete  Result Date: 11/15/2021 CLINICAL DATA:  History of hepatitis C and acute renal injury EXAM: ABDOMEN ULTRASOUND COMPLETE COMPARISON:  12/31/2018 FINDINGS: Gallbladder: Gallbladder is well distended. No cholelithiasis or wall thickening is noted. Mild pericholecystic fluid is noted felt to be related to mild ascites. Common bile duct: Diameter: 4.7 mm. Liver:  Nodularity is noted without focal mass. 8 mm left hepatic cyst is seen. Portal vein is patent on color Doppler imaging with normal direction of blood flow towards the liver. IVC: No abnormality visualized. Pancreas: Visualized portion unremarkable. Spleen: Size and appearance within normal limits. Fluid distended stomach Is noted adjacent to the splenic hilum. Right Kidney: Length: 7.3 cm. Echogenicity within normal limits. No mass or hydronephrosis visualized. Left Kidney: Length: 8.3 cm. Echogenicity within normal  limits. No mass or hydronephrosis visualized. Abdominal aorta: No aneurysm visualized. Other findings: Bladder is within normal limits. IMPRESSION: Minimal free abdominal fluid. Small up attic cyst.  No follow-up is recommended. Electronically Signed   By: Inez Catalina M.D.   On: 11/15/2021 19:24   ECHOCARDIOGRAM COMPLETE  Result Date: 11/15/2021    ECHOCARDIOGRAM REPORT   Patient Name:   [name] Marcoe Date of Exam: 11/15/2021 Medical Rec #:  [id number]      Height:       67.0 in Accession #:    [id number]     Weight:       127.6 lb Date of Birth:  09-05-1960      BSA:          1.671 m Patient Age:    54 years       BP:           130/66 mmHg Patient Gender: F              HR:           85 bpm. Exam Location:  Inpatient Procedure: 2D Echo, Cardiac Doppler, Color Doppler and Intracardiac            Opacification Agent Indications:    Elevated Troponin  History:        Patient has prior history of Echocardiogram examinations, most                 recent 08/20/2019. Signs/Symptoms:Shortness of Breath; Risk                 Factors:Hypertension. Chronic kidney disease. GERD. Non                 productive cough.  Sonographer:    Darlina Sicilian RDCS Referring Phys: [id number] Jonnie Finner  Sonographer Comments: Image acquisition challenging due to respiratory motion and Image acquisition challenging due to COPD. IMPRESSIONS  1. Left ventricular ejection fraction, by estimation, is 55 to 60%. The left ventricle has normal function. The left ventricle has no regional wall motion abnormalities. Left ventricular diastolic parameters are consistent with Grade I diastolic dysfunction (impaired relaxation).  2. Right ventricular systolic function is normal. The right ventricular size is normal. There is normal pulmonary artery systolic pressure.  3. Left atrial size was mildly dilated.  4. The mitral valve is normal in structure. No evidence of mitral valve regurgitation. No evidence of mitral stenosis.  5. The aortic valve was  not well visualized. Aortic valve regurgitation is not visualized. No aortic stenosis is present.  6. The inferior vena cava is normal in size with <50% respiratory variability, suggesting right atrial pressure of 8 mmHg. FINDINGS  Left Ventricle: Left ventricular ejection fraction, by estimation, is 55 to 60%. The left ventricle has normal function. The left ventricle has no regional wall motion abnormalities. The left ventricular internal cavity size was normal in size. There is  no left ventricular hypertrophy. Left ventricular diastolic parameters are consistent with Grade I diastolic dysfunction (impaired relaxation). Indeterminate filling  pressures. Right Ventricle: The right ventricular size is normal. No increase in right ventricular wall thickness. Right ventricular systolic function is normal. There is normal pulmonary artery systolic pressure. The tricuspid regurgitant velocity is 2.13 m/s, and  with an assumed right atrial pressure of 8 mmHg, the estimated right ventricular systolic pressure is 95.6 mmHg. Left Atrium: Left atrial size was mildly dilated. Right Atrium: Right atrial size was normal in size. Pericardium: There is no evidence of pericardial effusion. Mitral Valve: The mitral valve is normal in structure. No evidence of mitral valve regurgitation. No evidence of mitral valve stenosis. Tricuspid Valve: The tricuspid valve is normal in structure. Tricuspid valve regurgitation is trivial. No evidence of tricuspid stenosis. Aortic Valve: The aortic valve was not well visualized. Aortic valve regurgitation is not visualized. No aortic stenosis is present. Pulmonic Valve: The pulmonic valve was normal in structure. Pulmonic valve regurgitation is not visualized. No evidence of pulmonic stenosis. Aorta: The aortic root is normal in size and structure. Venous: The inferior vena cava is normal in size with less than 50% respiratory variability, suggesting right atrial pressure of 8 mmHg. IAS/Shunts:  No atrial level shunt detected by color flow Doppler.  LEFT VENTRICLE PLAX 2D LVIDd:         5.40 cm      Diastology LVIDs:         4.20 cm      LV e' medial:    8.70 cm/s LV PW:         0.85 cm      LV E/e' medial:  12.8 LV IVS:        0.80 cm      LV e' lateral:   9.90 cm/s LVOT diam:     2.00 cm      LV E/e' lateral: 11.2 LV SV:         65 LV SV Index:   39 LVOT Area:     3.14 cm  LV Volumes (MOD) LV vol d, MOD A2C: 128.0 ml LV vol d, MOD A4C: 118.0 ml LV vol s, MOD A2C: 51.4 ml LV vol s, MOD A4C: 44.3 ml LV SV MOD A2C:     76.6 ml LV SV MOD A4C:     118.0 ml LV SV MOD BP:      77.1 ml RIGHT VENTRICLE RV S prime:     11.00 cm/s TAPSE (M-mode): 2.2 cm LEFT ATRIUM             Index LA diam:        3.30 cm 1.97 cm/m LA Vol (A2C):   55.2 ml 33.04 ml/m LA Vol (A4C):   51.4 ml 30.76 ml/m LA Biplane Vol: 54.8 ml 32.80 ml/m  AORTIC VALVE LVOT Vmax:   112.00 cm/s LVOT Vmean:  83.400 cm/s LVOT VTI:    0.208 m  AORTA Ao Root diam: 3.20 cm MITRAL VALVE                TRICUSPID VALVE MV Area (PHT): 3.21 cm     TR Peak grad:   18.1 mmHg MV Decel Time: 236 msec     TR Vmax:        213.00 cm/s MV E velocity: 111.00 cm/s MV A velocity: 107.00 cm/s  SHUNTS MV E/A ratio:  1.04         Systemic VTI:  0.21 m  Systemic Diam: 2.00 cm Skeet Latch MD Electronically signed by Skeet Latch MD Signature Date/Time: 11/15/2021/6:50:05 PM    Final     Labs: BMET Recent Labs  Lab 11/15/21 0528 11/16/21 0012 11/17/21 0504  NA 135 135 140  K 4.1 4.6 4.8  CL 112* 106 112*  CO2 15* 14* 22  GLUCOSE 115* 113* 121*  BUN 40* 43* 57*  CREATININE 4.15* 4.46* 4.43*  CALCIUM 7.7* 8.1* 7.8*  PHOS  --  6.2*  --    CBC Recent Labs  Lab 11/15/21 0528 11/15/21 1103 11/16/21 0012 11/16/21 0602 11/16/21 1104 11/16/21 1641  WBC 9.2  --  9.2 9.2 10.0 9.6  NEUTROABS 5.9  --   --   --  9.2* 8.5*  HGB 4.5*   < > 7.6* 8.4* 8.7* 8.1*  HCT 15.1*   < > 23.9* 26.1* 26.7* 24.4*  MCV 95.6  --  90.2 89.1  89.3 89.1  PLT 368  --  326 328 356 352   < > = values in this interval not displayed.    Medications:     darbepoetin (ARANESP) injection - NON-DIALYSIS  100 mcg Subcutaneous Q Fri-1800   feeding supplement  237 mL Oral X32G   folic acid  1 mg Oral Daily   ipratropium-albuterol  3 mL Nebulization TID   multivitamin with minerals  1 tablet Oral Daily   pantoprazole (PROTONIX) IV  40 mg Intravenous Q12H   predniSONE  40 mg Oral Q breakfast   sodium bicarbonate  1,300 mg Oral TID   thiamine  100 mg Oral Daily      Otelia Santee, MD 11/17/2021, 12:33 PM

## 2021-11-17 NOTE — Progress Notes (Addendum)
Progress Note   Patient: [name] NWG:[id number] DOB: 1960-10-13 DOA: 11/15/2021     2 DOS: the patient was seen and examined on 11/17/2021   Brief hospital course: [name] was admitted to the hospital with the working diagnosis of upper GI bleed, and acute blood loss anemia in the setting of COPD exacerbation.   61 yo female with the past medical history of CKD stage 4, chronic hep c, duodenal ulcer, hypertension and COPD who presented with respiratory distress. Reported several days of dyspnea, with non productive cough. Symptoms persistent and worsening despite use of inhalers at home, prompting her to come to the hospital. Positive melena. On her initial physical examination her blood pressure was 113/64. HR 95, RR 19 and 02 saturation 95%, lungs with no wheezing, heart with S1 and S2 present and rhythmic, abdomen with no distention and no lower extremity edema.   ABG 7.28/27/121/12.7/ 99%  Na 135 K 4.6  Cl 106 bicarbonate 14, glucose 113 bun 43 cr 4.46  High sensitive troponin 57, 68, 70  Iron 14, TIBC 449, transferrin saturation 3, ferritin 11  Wbc 9,2 hgb 4,5 plt 368  Sars covid 19 negative   Chest radiograph with hyperinflation with no infiltrates. CT chest with no frank infiltrates.   EKG 105 bpm, normal axis, normal intervals, sinus rhythm with no significant ST segment or T wave changes.   Patient had 3 units PRBC transfusion with good toleration.  She was placed on systemic corticosteroids and bronchodilator therapy.   09/02 EGD mild gastritis. Recommendations to continue with pantoprazole.    Assessment and Plan: COPD with acute exacerbation ([place]) Patient with improving in dyspnea, continue to have dry cough. Symptoms better with bronchodilator therapy.  Plan to continue with oxymetry monitoring Bronchodilator therapy and inhaled corticosteroids  Discontiunue systemic steroid therapy Azithromycin for 5 days.  Chest CT personally reviewed with no features of  pneumonia, (ruled out) discontinue ceftriaxone.   GIB (gastrointestinal bleeding) Upper GI bleed with acute on chronic blood loss anemia.  Gastric ulcers with no current bleeding.   Iron stores with profound iron deficiency anemia.  Plan to add IV iron and follow up iron stores as outpatient.   Acute kidney injury superimposed on chronic kidney disease ([place]) CKD stage IV. Hypocalcemia   Renal function with serum cr at 4,43 with K at 4,8 and serum bicarbonate at 22.  Plan to continue close follow up on renal function and electrolytes.  Continue blood pressure control. Holding on diuretic therapy for now.   Anemia of chronic renal disease, continue with EPO.    Chronic hepatitis C without hepatic coma ([place]) Plan to follow up as outpatient.   HLD (hyperlipidemia) Continue with statin therapy.   Hypertension Systolic blood pressure 578 to 160 mmHg. Plan to add low dose amlodipine if continue with hypertension.         Subjective: patient with improvement in dyspnea, no chest pain, positive ambulatory dysfunction.   Physical Exam: Vitals:   11/17/21 0825 11/17/21 0830 11/17/21 0843 11/17/21 0938  BP: 125/60 (!) 119/97 119/75   Pulse: 86 80 78   Resp: '11 17 16   '$ Temp:   (!) 97.4 F (36.3 C)   TempSrc:      SpO2: 98% 97% 96% 100%  Weight:      Height:       Neurology awake and alert ENT with mild pallor Cardiovascular with S1 and S2 present and rhythmic Respiratory with no wheezing or rhonchi, bilateral scattered rales,  Abdomen with no distention  No lower extremity edema  Data Reviewed:    Family Communication: no family at the bedside   Disposition: Status is: Inpatient Remains inpatient appropriate because: anemia   Planned Discharge Destination: Home      Author: Tawni Millers, MD 11/17/2021 1:19 PM  For on call review [email or web address].

## 2021-11-17 NOTE — Assessment & Plan Note (Addendum)
Systolic blood pressure 518 to 160 mmHg. Resume blood pressure control with amlodipine.

## 2021-11-17 NOTE — Interval H&P Note (Signed)
History and Physical Interval Note:  11/17/2021 7:54 AM  [name]  has presented today for surgery, with the diagnosis of iron deficiency anemia, history of peptic ulcers.  The various methods of treatment have been discussed with the patient and family. After consideration of risks, benefits and other options for treatment, the patient has consented to  Procedure(s): ESOPHAGOGASTRODUODENOSCOPY (EGD) WITH PROPOFOL (N/A) as a surgical intervention.  The patient's history has been reviewed, patient examined, no change in status, stable for surgery.  I have reviewed the patient's chart and labs.  Questions were answered to the patient's satisfaction.     Jackquline Denmark

## 2021-11-17 NOTE — Assessment & Plan Note (Signed)
Upper GI bleed with acute on chronic blood loss anemia.  Gastric ulcers with no current bleeding.  Follow up with biopsies.   Iron stores with profound iron deficiency anemia.  Iron panel with serum iron 14, TIBC 449, transferrin saturation 3 and ferritin. Patient had one dose of IV iron.

## 2021-11-18 ENCOUNTER — Encounter (HOSPITAL_COMMUNITY): Payer: Self-pay | Admitting: Gastroenterology

## 2021-11-18 DIAGNOSIS — N179 Acute kidney failure, unspecified: Secondary | ICD-10-CM

## 2021-11-18 DIAGNOSIS — N189 Chronic kidney disease, unspecified: Secondary | ICD-10-CM

## 2021-11-18 LAB — BASIC METABOLIC PANEL
Anion gap: 12 (ref 5–15)
BUN: 61 mg/dL — ABNORMAL HIGH (ref 8–23)
CO2: 16 mmol/L — ABNORMAL LOW (ref 22–32)
Calcium: 8.1 mg/dL — ABNORMAL LOW (ref 8.9–10.3)
Chloride: 112 mmol/L — ABNORMAL HIGH (ref 98–111)
Creatinine, Ser: 4.16 mg/dL — ABNORMAL HIGH (ref 0.44–1.00)
GFR, Estimated: 12 mL/min — ABNORMAL LOW (ref >60)
Glucose, Bld: 100 mg/dL — ABNORMAL HIGH (ref 70–99)
Potassium: 3.9 mmol/L (ref 3.5–5.1)
Sodium: 140 mmol/L (ref 135–145)

## 2021-11-18 LAB — HEMOGLOBIN AND HEMATOCRIT, BLOOD
HCT: 27.7 % — ABNORMAL LOW (ref 36.0–46.0)
Hemoglobin: 8.7 g/dL — ABNORMAL LOW (ref 12.0–15.0)

## 2021-11-18 MED ORDER — OXYCODONE HCL 5 MG PO TABS: 5.0000 mg | ORAL_TABLET | Freq: Three times a day (TID) | ORAL | Status: DC | PRN

## 2021-11-18 MED ORDER — MOMETASONE FURO-FORMOTEROL FUM 200-5 MCG/ACT IN AERO: 2.0000 | INHALATION_SPRAY | Freq: Two times a day (BID) | RESPIRATORY_TRACT | 0 refills | Status: DC

## 2021-11-18 MED ORDER — ORAL CARE MOUTH RINSE
15.0000 mL | OROMUCOSAL | Status: DC | PRN
Start: 2021-11-18 — End: 2021-11-18

## 2021-11-18 MED ORDER — ENSURE ENLIVE PO LIQD: 237.0000 mL | ORAL | 12 refills | Status: DC

## 2021-11-18 MED ORDER — PANTOPRAZOLE SODIUM 40 MG PO TBEC: 40.0000 mg | DELAYED_RELEASE_TABLET | Freq: Every day | ORAL | 0 refills | Status: DC

## 2021-11-18 MED ORDER — TRAMADOL HCL 50 MG PO TABS: 50.0000 mg | ORAL_TABLET | Freq: Two times a day (BID) | ORAL | Status: DC | PRN

## 2021-11-18 MED ORDER — OXYCODONE HCL 5 MG PO TABS: 5.0000 mg | ORAL_TABLET | Freq: Three times a day (TID) | ORAL | 0 refills | Status: DC | PRN

## 2021-11-18 MED ORDER — FUROSEMIDE 40 MG PO TABS: 40.0000 mg | ORAL_TABLET | Freq: Every day | ORAL | Status: DC | PRN

## 2021-11-18 MED ORDER — GUAIFENESIN-DM 100-10 MG/5ML PO SYRP: 10.0000 mL | ORAL_SOLUTION | Freq: Four times a day (QID) | ORAL | 0 refills | Status: DC | PRN

## 2021-11-18 MED ORDER — IPRATROPIUM-ALBUTEROL 0.5-2.5 (3) MG/3ML IN SOLN
3.0000 mL | Freq: Four times a day (QID) | RESPIRATORY_TRACT | 0 refills | Status: DC | PRN
Start: 2021-11-18 — End: 2022-01-21

## 2021-11-18 MED ORDER — SODIUM BICARBONATE 650 MG PO TABS
1300.0000 mg | ORAL_TABLET | Freq: Three times a day (TID) | ORAL | 0 refills | Status: AC
Start: 2021-11-18 — End: 2021-12-18

## 2021-11-18 MED ORDER — FUROSEMIDE 40 MG PO TABS: 40.0000 mg | ORAL_TABLET | Freq: Every day | ORAL | 0 refills | Status: DC | PRN

## 2021-11-18 MED ORDER — PANTOPRAZOLE SODIUM 40 MG PO TBEC: 40.0000 mg | DELAYED_RELEASE_TABLET | Freq: Every day | ORAL | Status: DC

## 2021-11-18 NOTE — Progress Notes (Signed)
OT Cancellation Note  Patient Details Name: [name] MRN: [id number] DOB: 09/24/1960   Cancelled Treatment:    Reason Eval/Treat Not Completed: Fatigue/lethargy limiting ability to participate Patient reported having a bad morning having been up since 5am. OT to continue to follow and check back as schedule will allow.  Rennie Plowman, MS Acute Rehabilitation Department Office# [phone]  11/18/2021, 8:56 AM

## 2021-11-18 NOTE — TOC Transition Note (Addendum)
Transition of Care [facility]) - CM/SW Discharge Note   Patient Details  Name: ANJU SERENO MRN: [id number] Date of Birth: [birth year]  [facility] CM/SW Contact:  Dessa Phi, RN Phone Number: 11/18/2021, 11:28 AM   Clinical Narrative:Provided patient w/MATCH program letter with override on meds-scripts sent to [place]. Bus pass provdided also. Patient voiced understanding.See prior note for additional info. No further CM needs.  -2nd bus pass needed by nurse for patient.      Final next level of care: Home/Self Care Barriers to Discharge: No Barriers Identified   Patient Goals and CMS Choice Patient states their goals for this hospitalization and ongoing recovery are:: home CMS Medicare.gov Compare Post Acute Care list provided to::  (n/a)    Discharge Placement                       Discharge Plan and Services   Discharge Planning Services: CM Consult                                 Social Determinants of Health (SDOH) Interventions Food Insecurity Interventions: Intervention Not Indicated Housing Interventions: Other (Comment) (housing resource on discharge instructions) Transportation Interventions: Bus Pass Given   Readmission Risk Interventions    11/16/2021   12:16 PM 08/23/2019   11:46 AM  Readmission Risk Prevention Plan  Transportation Screening Complete Complete  PCP or Specialist Appt within 3-5 Days Complete   HRI or [place] Complete   Social Work Consult for [place] Planning/Counseling Complete   Palliative Care Screening Not Applicable   Medication Review Press photographer) Complete Complete  PCP or Specialist appointment within 3-5 days of discharge  Complete  HRI or [place]  Complete  SW Recovery Care/Counseling Consult  Complete  [place]  Not Applicable

## 2021-11-18 NOTE — Progress Notes (Signed)
  [facility] Medication Assistance Card Name: Dovey Fatzinger. [place] ID (MRN): [id number] Bin: [id number] RX Group: [id number] Discharge Date: 11/18/2021 Expiration Date:11/26/2021                                           (must be filled within 7 days of discharge)     You have been approved to have the prescriptions written by your discharging physician filled through our [facility] (Medication Assistance Through [facility]) program. This program allows for a one-time (no refills) 34-day supply of selected medications for a low copay amount.  The copay is $0.00 per prescription. For instance, if you have one prescription, you will pay $0.00; for two prescriptions, you pay $0.00; for three prescriptions, you pay $0.00; and so on.  Only certain pharmacies are participating in this program with [facility]. You will need to select one of the pharmacies from the attached list and take your prescriptions, this letter, and your photo ID to one of the [place] pharmacies, [facility], [facility] at [address] or [facility] [address].   We are excited that you are able to use the [facility] program to get your medications. These prescriptions must be filled within 7 days of hospital discharge or they will no longer be valid for the [facility] program. Should you have any problems with your prescriptions please contact your case management team member at [phone] for [place]/ [employer] you, [place] [employer]

## 2021-11-18 NOTE — Discharge Summary (Signed)
Physician Discharge Summary   Patient: [name] MRN: [id number] DOB: April 30, 1960  Admit date:     11/15/2021  Discharge date: 11/18/21  Discharge Physician: [name]   PCP: Cynthia Pier, MD   Recommendations at discharge:    Plan to continue bronchodilator therapy at home.  Added furosemide as needed and close follow up with renal function as outpatient Follow up renal function in 7 days Nephrology will call her to arrange appointment follow up. Continue with pantoprazole 40 mg daily Patient did receive IV iron during this hospitalization  Follow up with EGD biopsies.   Discharge Diagnoses: Active Problems:   COPD with acute exacerbation ([place])   GIB (gastrointestinal bleeding)   Acute kidney injury superimposed on chronic kidney disease (HCC)   Chronic hepatitis C without hepatic coma (HCC)   HLD (hyperlipidemia)   Hypertension  Resolved Problems:   * No resolved hospital problems. [name]: [name] was admitted to the hospital with the working diagnosis of upper GI bleed, and acute blood loss anemia in the setting of COPD exacerbation.   61 yo female with the past medical history of CKD stage 4, chronic hep c, duodenal ulcer, hypertension and COPD who presented with respiratory distress. Reported several days of dyspnea, with non productive cough. Symptoms persistent and worsening despite use of inhalers at home, prompting her to come to the hospital. Positive melena. On her initial physical examination her blood pressure was 113/64. HR 95, RR 19 and 02 saturation 95%, lungs with no wheezing, heart with S1 and S2 present and rhythmic, abdomen with no distention and no lower extremity edema.   ABG 7.28/27/121/12.7/ 99%  Na 135 K 4.6  Cl 106 bicarbonate 14, glucose 113 bun 43 cr 4.46  High sensitive troponin 57, 68, 70  Iron 14, TIBC 449, transferrin saturation 3, ferritin 11  Wbc 9,2 hgb 4,5 plt 368  Sars covid 19 negative   Chest  radiograph with hyperinflation with no infiltrates. CT chest with no frank infiltrates.   EKG 105 bpm, normal axis, normal intervals, sinus rhythm with no significant ST segment or T wave changes.   Patient had 3 units PRBC transfusion with good toleration.  She was placed on systemic corticosteroids and bronchodilator therapy.   09/02 EGD mild gastritis. Recommendations to continue with pantoprazole.  09/03 renal function and oxygenation stable, plan to discharge home and follow up as outpatient with nephrology and primary care.    Assessment and Plan: COPD with acute exacerbation [facility]) Patient was admitted to the progressive care unit, she was placed on systemic corticosteroids and aggressive bronchodilator therapy. Inhaled corticosteroids.  Initially placed on ceftriaxone and azithromycin suspecting pneumonia. Pulmonary infection posteriorly was ruled and antibiotic therapy was discontinued.   At the time of her discharge her 02 saturation is 94 to 97% on room air.   Plan to continue bronchodilator therapy and inhaled corticosteroids.  Follow up as outpatient.    GIB (gastrointestinal bleeding) Upper GI bleed with acute on chronic blood loss anemia.  Gastric ulcers with no current bleeding.   Iron stores with profound iron deficiency anemia.  Iron panel with serum iron 14, TIBC 449, transferrin saturation 3 and ferritin. Patient had one dose of IV iron.    Acute kidney injury superimposed on chronic kidney disease ([place]) CKD stage IV. Hypocalcemia. Non anion gap metabolic acidosis.    At the time of her discharge her renal function is stable with serum cr at 4,16 with K at  3,9 and serum bicarbonate at 16  Anion gap 12.   Patient will need close follow up as outpatient with nephrology. Will add as needed furosemide 40 mg daily for edema.   Anemia of chronic renal disease, patient received EPO during her hospitalization.    Chronic hepatitis C without hepatic coma  ([place]) Plan to follow up as outpatient.   HLD (hyperlipidemia) Continue with statin therapy.   Hypertension Systolic blood pressure 854 to 160 mmHg. Resume blood pressure control with amlodipine.          Consultants: nephrology, gastroenterology  Procedures performed: EGD  Disposition: Home Diet recommendation:  Cardiac diet DISCHARGE MEDICATION: Allergies as of 11/18/2021       Reactions   Zestril [lisinopril] Other (See Comments)   Hyperkalemia        Medication List     STOP taking these medications    atorvastatin 10 MG tablet Commonly known as: LIPITOR   famotidine 20 MG tablet Commonly known as: PEPCID   FeroSul 325 (65 FE) MG tablet Generic drug: ferrous sulfate       TAKE these medications    albuterol 108 (90 Base) MCG/ACT inhaler Commonly known as: VENTOLIN HFA INHALE 2 PUFFS INTO THE LUNGS EVERY 6 HOURS AS NEEDED FOR WHEEZING OR SHORTNESS OF BREATH.   amLODipine 5 MG tablet Commonly known as: NORVASC Take 1 tablet (5 mg total) by mouth daily.   feeding supplement Liqd Take 237 mLs by mouth daily.   furosemide 40 MG tablet Commonly known as: LASIX Take 1 tablet (40 mg total) by mouth daily as needed for edema or fluid (as needed for leg swelling).   gabapentin 100 MG capsule Commonly known as: NEURONTIN Take 1 capsule (100 mg total) by mouth 3 (three) times daily.   guaiFENesin-dextromethorphan 100-10 MG/5ML syrup Commonly known as: ROBITUSSIN DM Take 10 mLs by mouth every 6 (six) hours as needed for cough.   ipratropium-albuterol 0.5-2.5 (3) MG/3ML Soln Commonly known as: DUONEB Take 3 mLs by nebulization every 6 (six) hours as needed (shortness of breath and wheezing).   mometasone-formoterol 200-5 MCG/ACT Aero Commonly known as: DULERA Inhale 2 puffs into the lungs 2 (two) times daily.   multivitamin with minerals Tabs tablet Take 1 tablet by mouth daily.   oxyCODONE 5 MG immediate release tablet Commonly known as: Oxy  IR/ROXICODONE Take 1 tablet (5 mg total) by mouth every 8 (eight) hours as needed for severe pain.   pantoprazole 40 MG tablet Commonly known as: PROTONIX Take 1 tablet (40 mg total) by mouth daily. Start taking on: November 19, 2021   sodium bicarbonate 650 MG tablet Take 2 tablets (1,300 mg total) by mouth 3 (three) times daily. What changed:  how much to take when to take this   traZODone 50 MG tablet Commonly known as: DESYREL Take 1 tablet (50 mg total) by mouth at bedtime as needed for sleep.               Durable Medical Equipment  (From admission, onward)           Start     Ordered   11/18/21 0000  For home use only DME Nebulizer machine       Question Answer Comment  Patient needs a nebulizer to treat with the following condition COPD exacerbation ([place])   Length of Need 12 Months      11/18/21 1053            Follow-up Information  [place]. Schedule an appointment as soon as possible for a visit.   Why: pt will make own PCP appt Contact information: [address] [place] [address] [phone]               Discharge Exam: Danley Danker Weights   11/15/21 0530 11/15/21 1339  Weight: 62.6 kg 57.9 kg   BP (!) 153/82 (BP Location: Right Arm)   Pulse 85   Temp 98.9 F (37.2 C) (Oral)   Resp 20   Ht '5\' 7"'$  (1.702 m)   Wt 57.9 kg   SpO2 93%   BMI 19.99 kg/m   Patient with improvement in her dyspnea, no edema no chest pain  Neurology awake and alert ENT with mild pallor Cardiovascular with S1 and S2 present and rhythmic with no gallops or murmurs Respiratory with scattered rhonchi, no rales or wheezing Abdomen with no distention  No lower extremity edema   Condition at discharge: stable  The results of significant diagnostics from this hospitalization (including imaging, microbiology, ancillary and laboratory) are listed below for reference.   Imaging  Studies: US Abdomen Complete  Result Date: 11/15/2021 CLINICAL DATA:  History of hepatitis C and acute renal injury EXAM: ABDOMEN ULTRASOUND COMPLETE COMPARISON:  12/31/2018 FINDINGS: Gallbladder: Gallbladder is well distended. No cholelithiasis or wall thickening is noted. Mild pericholecystic fluid is noted felt to be related to mild ascites. Common bile duct: Diameter: 4.7 mm. Liver: Nodularity is noted without focal mass. 8 mm left hepatic cyst is seen. Portal vein is patent on color Doppler imaging with normal direction of blood flow towards the liver. IVC: No abnormality visualized. Pancreas: Visualized portion unremarkable. Spleen: Size and appearance within normal limits. Fluid distended stomach Is noted adjacent to the splenic hilum. Right Kidney: Length: 7.3 cm. Echogenicity within normal limits. No mass or hydronephrosis visualized. Left Kidney: Length: 8.3 cm. Echogenicity within normal limits. No mass or hydronephrosis visualized. Abdominal aorta: No aneurysm visualized. Other findings: Bladder is within normal limits. IMPRESSION: Minimal free abdominal fluid. Small up attic cyst.  No follow-up is recommended. Electronically Signed   By: Inez Catalina M.D.   On: 11/15/2021 19:24   ECHOCARDIOGRAM COMPLETE  Result Date: 11/15/2021    ECHOCARDIOGRAM REPORT   Patient Name:   LEEYAH HEATHER Massman Date of Exam: 11/15/2021 Medical Rec #:  [id number]      Height:       67.0 in Accession #:    [id number]     Weight:       127.6 lb Date of Birth:  Jul 22, 1960      BSA:          1.671 m Patient Age:    32 years       BP:           130/66 mmHg Patient Gender: F              HR:           85 bpm. Exam Location:  Inpatient Procedure: 2D Echo, Cardiac Doppler, Color Doppler and Intracardiac            Opacification Agent Indications:    Elevated Troponin  History:        Patient has prior history of Echocardiogram examinations, most                 recent 08/20/2019. Signs/Symptoms:Shortness of Breath; Risk  Factors:Hypertension. Chronic kidney disease. GERD. Non                 productive cough.  Sonographer:    Darlina Sicilian RDCS Referring Phys: [id number] Jonnie Finner  Sonographer Comments: Image acquisition challenging due to respiratory motion and Image acquisition challenging due to COPD. IMPRESSIONS  1. Left ventricular ejection fraction, by estimation, is 55 to 60%. The left ventricle has normal function. The left ventricle has no regional wall motion abnormalities. Left ventricular diastolic parameters are consistent with Grade I diastolic dysfunction (impaired relaxation).  2. Right ventricular systolic function is normal. The right ventricular size is normal. There is normal pulmonary artery systolic pressure.  3. Left atrial size was mildly dilated.  4. The mitral valve is normal in structure. No evidence of mitral valve regurgitation. No evidence of mitral stenosis.  5. The aortic valve was not well visualized. Aortic valve regurgitation is not visualized. No aortic stenosis is present.  6. The inferior vena cava is normal in size with <50% respiratory variability, suggesting right atrial pressure of 8 mmHg. FINDINGS  Left Ventricle: Left ventricular ejection fraction, by estimation, is 55 to 60%. The left ventricle has normal function. The left ventricle has no regional wall motion abnormalities. The left ventricular internal cavity size was normal in size. There is  no left ventricular hypertrophy. Left ventricular diastolic parameters are consistent with Grade I diastolic dysfunction (impaired relaxation). Indeterminate filling pressures. Right Ventricle: The right ventricular size is normal. No increase in right ventricular wall thickness. Right ventricular systolic function is normal. There is normal pulmonary artery systolic pressure. The tricuspid regurgitant velocity is 2.13 m/s, and  with an assumed right atrial pressure of 8 mmHg, the estimated right ventricular systolic pressure is 14.7 mmHg. Left  Atrium: Left atrial size was mildly dilated. Right Atrium: Right atrial size was normal in size. Pericardium: There is no evidence of pericardial effusion. Mitral Valve: The mitral valve is normal in structure. No evidence of mitral valve regurgitation. No evidence of mitral valve stenosis. Tricuspid Valve: The tricuspid valve is normal in structure. Tricuspid valve regurgitation is trivial. No evidence of tricuspid stenosis. Aortic Valve: The aortic valve was not well visualized. Aortic valve regurgitation is not visualized. No aortic stenosis is present. Pulmonic Valve: The pulmonic valve was normal in structure. Pulmonic valve regurgitation is not visualized. No evidence of pulmonic stenosis. Aorta: The aortic root is normal in size and structure. Venous: The inferior vena cava is normal in size with less than 50% respiratory variability, suggesting right atrial pressure of 8 mmHg. IAS/Shunts: No atrial level shunt detected by color flow Doppler.  LEFT VENTRICLE PLAX 2D LVIDd:         5.40 cm      Diastology LVIDs:         4.20 cm      LV e' medial:    8.70 cm/s LV PW:         0.85 cm      LV E/e' medial:  12.8 LV IVS:        0.80 cm      LV e' lateral:   9.90 cm/s LVOT diam:     2.00 cm      LV E/e' lateral: 11.2 LV SV:         65 LV SV Index:   39 LVOT Area:     3.14 cm  LV Volumes (MOD) LV vol d, MOD A2C: 128.0 ml LV vol d, MOD A4C:  118.0 ml LV vol s, MOD A2C: 51.4 ml LV vol s, MOD A4C: 44.3 ml LV SV MOD A2C:     76.6 ml LV SV MOD A4C:     118.0 ml LV SV MOD BP:      77.1 ml RIGHT VENTRICLE RV S prime:     11.00 cm/s TAPSE (M-mode): 2.2 cm LEFT ATRIUM             Index LA diam:        3.30 cm 1.97 cm/m LA Vol (A2C):   55.2 ml 33.04 ml/m LA Vol (A4C):   51.4 ml 30.76 ml/m LA Biplane Vol: 54.8 ml 32.80 ml/m  AORTIC VALVE LVOT Vmax:   112.00 cm/s LVOT Vmean:  83.400 cm/s LVOT VTI:    0.208 m  AORTA Ao Root diam: 3.20 cm MITRAL VALVE                TRICUSPID VALVE MV Area (PHT): 3.21 cm     TR Peak grad:    18.1 mmHg MV Decel Time: 236 msec     TR Vmax:        213.00 cm/s MV E velocity: 111.00 cm/s MV A velocity: 107.00 cm/s  SHUNTS MV E/A ratio:  1.04         Systemic VTI:  0.21 m                             Systemic Diam: 2.00 cm Skeet Latch MD Electronically signed by Skeet Latch MD Signature Date/Time: 11/15/2021/6:50:05 PM    Final    CT CHEST WO CONTRAST  Result Date: 11/15/2021 CLINICAL DATA:  Respiratory illness/nondiagnostic radiograph. COPD. Gastroesophageal reflux disease. Hyperlipidemia. Renal insufficiency. Shortness of breath. EXAM: CT CHEST WITHOUT CONTRAST TECHNIQUE: Multidetector CT imaging of the chest was performed following the standard protocol without IV contrast. RADIATION DOSE REDUCTION: This exam was performed according to the departmental dose-optimization program which includes automated exposure control, adjustment of the mA and/or kV according to patient size and/or use of iterative reconstruction technique. COMPARISON:  Plain film of earlier today.  No comparison CT FINDINGS: Cardiovascular: Mild motion degradation throughout. Aortic atherosclerosis. Mild cardiomegaly, without pericardial effusion. Three vessel coronary artery calcification. Mediastinum/Nodes: No mediastinal or definite hilar adenopathy, given limitations of unenhanced CT. Debris in the upper esophagus on 14/2. Lungs/Pleura: No pleural fluid. Dependent bilateral lower lobe compressive atelectasis. Diffuse peribronchovascular ground-glass nodularity. Most confluent in the left lower lobe, including on 71/8. No well-defined lobar consolidation. Upper Abdomen: Segment 2-3 subcentimeter left hepatic lobe cyst. Normal imaged portions of the spleen, stomach, adrenal glands. Musculoskeletal: No acute osseous abnormality. IMPRESSION: 1. Mildly motion degraded exam. 2. Relatively diffuse peribronchovascular ground-glass nodularity, most consistent with infection, including atypical etiologies. 3. Debris within the  esophagus may represent dysmotility and/or gastroesophageal reflux. 4. Age advanced coronary artery atherosclerosis. Recommend assessment of coronary risk factors. 5.  Aortic Atherosclerosis (ICD10-I70.0). Electronically Signed   By: Abigail Miyamoto M.D.   On: 11/15/2021 12:37   DG Chest Port 1 View  Result Date: 11/15/2021 CLINICAL DATA:  Shortness of breath EXAM: PORTABLE CHEST 1 VIEW COMPARISON:  08/19/2019 FINDINGS: Borderline heart size. Congested appearance of central vessels versus airway thickening. There is no edema, consolidation, effusion, or pneumothorax. IMPRESSION: Symmetric airway thickening or vascular congestion Electronically Signed   By: Jorje Guild M.D.   On: 11/15/2021 06:29    Microbiology: Results for orders placed or performed during the hospital  encounter of 11/15/21  SARS Coronavirus 2 by RT PCR (hospital order, performed in [facility] hospital lab) *cepheid single result test* Anterior Nasal Swab     Status: None   Collection Time: 11/15/21  5:45 AM   Specimen: Anterior Nasal Swab  Result Value Ref Range Status   SARS Coronavirus 2 by RT PCR NEGATIVE NEGATIVE Final    Comment: (NOTE) SARS-CoV-2 target nucleic acids are NOT DETECTED.  The SARS-CoV-2 RNA is generally detectable in upper and lower respiratory specimens during the acute phase of infection. The lowest concentration of SARS-CoV-2 viral copies this assay can detect is 250 copies / mL. A negative result does not preclude SARS-CoV-2 infection and should not be used as the sole basis for treatment or other patient management decisions.  A negative result may occur with improper specimen collection / handling, submission of specimen other than nasopharyngeal swab, presence of viral mutation(s) within the areas targeted by this assay, and inadequate number of viral copies (<250 copies / mL). A negative result must be combined with clinical observations, patient history, and epidemiological  information.  Fact Sheet for Patients:   https://www.patel.info/  Fact Sheet for Healthcare Providers: https://hall.com/  This test is not yet approved or  cleared by the [place] FDA and has been authorized for detection and/or diagnosis of SARS-CoV-2 by FDA under an Emergency Use Authorization (EUA).  This EUA will remain in effect (meaning this test can be used) for the duration of the COVID-19 declaration under Section 564(b)(1) of the Act, 21 U.S.C. section 360bbb-3(b)(1), unless the authorization is terminated or revoked sooner.  Performed at [facility], [place] [address], [place], [place] [address]   Respiratory (~20 pathogens) panel by PCR     Status: None   Collection Time: 11/15/21  4:41 PM   Specimen: Nasopharyngeal Swab; Respiratory  Result Value Ref Range Status   Adenovirus NOT DETECTED NOT DETECTED Final   Coronavirus 229E NOT DETECTED NOT DETECTED Final    Comment: (NOTE) The Coronavirus on the Respiratory Panel, DOES NOT test for the novel  Coronavirus (2019 nCoV)    Coronavirus HKU1 NOT DETECTED NOT DETECTED Final   Coronavirus NL63 NOT DETECTED NOT DETECTED Final   Coronavirus OC43 NOT DETECTED NOT DETECTED Final   Metapneumovirus NOT DETECTED NOT DETECTED Final   Rhinovirus / Enterovirus NOT DETECTED NOT DETECTED Final   Influenza A NOT DETECTED NOT DETECTED Final   Influenza B NOT DETECTED NOT DETECTED Final   Parainfluenza Virus 1 NOT DETECTED NOT DETECTED Final   Parainfluenza Virus 2 NOT DETECTED NOT DETECTED Final   Parainfluenza Virus 3 NOT DETECTED NOT DETECTED Final   Parainfluenza Virus 4 NOT DETECTED NOT DETECTED Final   Respiratory Syncytial Virus NOT DETECTED NOT DETECTED Final   Bordetella pertussis NOT DETECTED NOT DETECTED Final   Bordetella Parapertussis NOT DETECTED NOT DETECTED Final   Chlamydophila pneumoniae NOT DETECTED NOT DETECTED Final   Mycoplasma pneumoniae NOT  DETECTED NOT DETECTED Final    Comment: Performed at [facility], [place]. [address], [place], [place] [address]    Labs: CBC: Recent Labs  Lab 11/15/21 0528 11/15/21 1103 11/16/21 0012 11/16/21 0602 11/16/21 1104 11/16/21 1641 11/18/21 0517  WBC 9.2  --  9.2 9.2 10.0 9.6  --   NEUTROABS 5.9  --   --   --  9.2* 8.5*  --   HGB 4.5*   < > 7.6* 8.4* 8.7* 8.1* 8.7*  HCT 15.1*   < >  23.9* 26.1* 26.7* 24.4* 27.7*  MCV 95.6  --  90.2 89.1 89.3 89.1  --   PLT 368  --  326 328 356 352  --    < > = values in this interval not displayed.   Basic Metabolic Panel: Recent Labs  Lab 11/15/21 0528 11/16/21 0012 11/17/21 0504 11/18/21 0517  NA 135 135 140 140  K 4.1 4.6 4.8 3.9  CL 112* 106 112* 112*  CO2 15* 14* 22 16*  GLUCOSE 115* 113* 121* 100*  BUN 40* 43* 57* 61*  CREATININE 4.15* 4.46* 4.43* 4.16*  CALCIUM 7.7* 8.1* 7.8* 8.1*  PHOS  --  6.2*  --   --    Liver Function Tests: Recent Labs  Lab 11/15/21 0528 11/16/21 0012  AST 22 21  ALT 10 11  ALKPHOS 53 49  BILITOT 0.4 0.4  PROT 7.2 7.4  ALBUMIN 2.6* 2.7*   CBG: No results for input(s): "GLUCAP" in the last 168 hours.  Discharge time spent: greater than 30 minutes.  Signed: Tawni Millers, MD [facility] 11/18/2021

## 2021-11-18 NOTE — Progress Notes (Signed)
The patient is alert and oriented, ambulatory. Gait is steady no assistive device need. Pt expressed transportation needs, bus passes provided. Ambulatory stat assessed 92% while ambulating in room. Discharge instructions reviewed with the patient. She denied questions or concerns at this time. No change from am assessment.

## 2021-11-19 LAB — HCV RNA QUANT RFLX ULTRA OR GENOTYP
HCV RNA Qnt(log copy/mL): 6.238 log10 IU/mL
HepC Qn: 1730000 IU/mL

## 2021-11-19 LAB — HEPATITIS C GENOTYPE

## 2021-11-19 NOTE — Progress Notes (Signed)
After d/c note: Upon review of patient noted neb machine ordered. Contacted patient tel#[phone] about neb machine-agree for [employer] to contact her for process for charity review if qualifies-spoke to Danielle from [employer] will process & contact patient directly for delivery if qualifies. No further CM needs.

## 2021-11-20 ENCOUNTER — Telehealth: Payer: Self-pay

## 2021-11-20 LAB — SURGICAL PATHOLOGY

## 2021-11-20 NOTE — Telephone Encounter (Signed)
Transition Care Management Follow-up Telephone Call Date of discharge and from where: 11/18/2021, [facility] How have you been since you were released from the hospital? She said she is doing okay except her legs continue to swell. She has the lasix and has been taking it daily.  Any questions or concerns? Yes-  She has been working with an Forensic psychologist and is waiting on a determination of her disability application.  She currently receives about $1200/month as a widow's pension.   Items Reviewed: Did the pt receive and understand the discharge instructions provided? Yes  Medications obtained and verified? Yes - she said she has all of the medications and did not have any questions about the med regime.   Other? No  Any new allergies since your discharge? No  Dietary orders reviewed? Yes, she still needs to obtain the feeding supplement.  Do you have support at home?  She currently in a rooming house but is looking for another place to live. NO family in the area.  Home Care and Equipment/Supplies: Were home health services ordered? No If so, what is the name of the agency? N/a  Has the agency set up a time to come to the patient's home? not applicable Were any new equipment or medical supplies ordered?  Yes: nebulizer What is the name of the medical supply agency? [employer] [facility] Were you able to get the supplies/equipment? Yes- it was delivered today.  Do you have any questions related to the use of the equipment or supplies? No  Functional Questionnaire: (I = Independent and D = Dependent) ADLs: independent.  She said that her knees have been bothering her. They have been weak and painful. Shealso stated they have been giving out on her    Follow up appointments reviewed:  PCP Hospital f/u appt confirmed? Yes  Scheduled to see Epimenio Sarin, PA- 11/28/2021.   [facility] f/u appt confirmed?  None scheduled at this time    Are transportation arrangements needed?  Sometimes.   She takes the bus if she is able.  If their condition worsens, is the pt aware to call PCP or go to the Emergency Dept.? Yes Was the patient provided with contact information for the PCP's office or ED? Yes Was to pt encouraged to call back with questions or concerns? Yes

## 2021-11-22 ENCOUNTER — Telehealth: Payer: Self-pay | Admitting: Gastroenterology

## 2021-11-22 ENCOUNTER — Other Ambulatory Visit: Payer: Self-pay

## 2021-11-22 DIAGNOSIS — B1920 Unspecified viral hepatitis C without hepatic coma: Secondary | ICD-10-CM

## 2021-11-22 NOTE — Telephone Encounter (Signed)
Patient returned your call, please advise. 

## 2021-11-22 NOTE — Telephone Encounter (Signed)
Spoke with pt. Documented under results notes. Pt verbalized understanding with all questions answered.    

## 2021-11-28 ENCOUNTER — Ambulatory Visit: Payer: Self-pay | Admitting: Physician Assistant

## 2021-11-28 ENCOUNTER — Other Ambulatory Visit (HOSPITAL_COMMUNITY): Payer: Self-pay

## 2021-11-28 ENCOUNTER — Encounter: Payer: Self-pay | Admitting: Family

## 2021-11-28 ENCOUNTER — Telehealth: Payer: Self-pay

## 2021-11-28 NOTE — Telephone Encounter (Signed)
[facility] Patient Advocate Encounter ? ?Insurance verification completed.   ? ?The patient is uninsured and will need patient assistance for medication. ? ?We can complete the application and will need to meet with the patient for signatures and income documentation. ? ?Leandre Wien, CPhT ?Specialty Pharmacy Patient Advocate ?[facility] ?Phone: [phone] ?Fax:  [phone]  ?

## 2021-11-28 NOTE — Progress Notes (Deleted)
Patient ID: [name], female   DOB: 11-09-1960, 61 y.o.   MRN: [id number]  After hospitalization 8/31-9/05/2021 Recommendations at discharge:     Plan to continue bronchodilator therapy at home.  Added furosemide as needed and close follow up with renal function as outpatient Follow up renal function in 7 days Nephrology will call her to arrange appointment follow up. Continue with pantoprazole 40 mg daily Patient did receive IV iron during this hospitalization  Follow up with EGD biopsies.    Discharge Diagnoses: Active Problems:   COPD with acute exacerbation (HCC)   GIB (gastrointestinal bleeding)   Acute kidney injury superimposed on chronic kidney disease (HCC)   Chronic hepatitis C without hepatic coma (HCC)   HLD (hyperlipidemia)   Hypertension  Hospital Course: [name] was admitted to the hospital with the working diagnosis of upper GI bleed, and acute blood loss anemia in the setting of COPD exacerbation.    61 yo female with the past medical history of CKD stage 4, chronic hep c, duodenal ulcer, hypertension and COPD who presented with respiratory distress. Reported several days of dyspnea, with non productive cough. Symptoms persistent and worsening despite use of inhalers at home, prompting her to come to the hospital. Positive melena. On her initial physical examination her blood pressure was 113/64. HR 95, RR 19 and 02 saturation 95%, lungs with no wheezing, heart with S1 and S2 present and rhythmic, abdomen with no distention and no lower extremity edema.   Assessment and Plan: COPD with acute exacerbation [facility]) Patient was admitted to the progressive care unit, she was placed on systemic corticosteroids and aggressive bronchodilator therapy. Inhaled corticosteroids.  Initially placed on ceftriaxone and azithromycin suspecting pneumonia. Pulmonary infection posteriorly was ruled and antibiotic therapy was discontinued.    At the time of her discharge her 02  saturation is 94 to 97% on room air.    Plan to continue bronchodilator therapy and inhaled corticosteroids.  Follow up as outpatient.     GIB (gastrointestinal bleeding) Upper GI bleed with acute on chronic blood loss anemia.  Gastric ulcers with no current bleeding.    Iron stores with profound iron deficiency anemia.  Iron panel with serum iron 14, TIBC 449, transferrin saturation 3 and ferritin. Patient had one dose of IV iron.      Acute kidney injury superimposed on chronic kidney disease ([place]) CKD stage IV. Hypocalcemia. Non anion gap metabolic acidosis.     At the time of her discharge her renal function is stable with serum cr at 4,16 with K at 3,9 and serum bicarbonate at 16  Anion gap 12.    Patient will need close follow up as outpatient with nephrology. Will add as needed furosemide 40 mg daily for edema.    Anemia of chronic renal disease, patient received EPO during her hospitalization.      Chronic hepatitis C without hepatic coma ([place]) Plan to follow up as outpatient.    HLD (hyperlipidemia) Continue with statin therapy.    Hypertension Systolic blood pressure 384 to 160 mmHg. Resume blood pressure control with amlodipine.
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
## 2021-12-25 ENCOUNTER — Other Ambulatory Visit: Payer: Self-pay

## 2022-01-18 ENCOUNTER — Other Ambulatory Visit: Payer: Self-pay

## 2022-01-21 ENCOUNTER — Other Ambulatory Visit: Payer: Self-pay

## 2022-01-21 ENCOUNTER — Encounter: Payer: Self-pay | Admitting: Nurse Practitioner

## 2022-01-21 ENCOUNTER — Ambulatory Visit: Payer: Self-pay | Attending: Nurse Practitioner | Admitting: Nurse Practitioner

## 2022-01-21 VITALS — BP 202/90 | HR 90 | Temp 98.0°F | Ht 67.0 in | Wt 120.8 lb

## 2022-01-21 DIAGNOSIS — I129 Hypertensive chronic kidney disease with stage 1 through stage 4 chronic kidney disease, or unspecified chronic kidney disease: Secondary | ICD-10-CM

## 2022-01-21 DIAGNOSIS — F419 Anxiety disorder, unspecified: Secondary | ICD-10-CM

## 2022-01-21 DIAGNOSIS — K219 Gastro-esophageal reflux disease without esophagitis: Secondary | ICD-10-CM

## 2022-01-21 DIAGNOSIS — Z23 Encounter for immunization: Secondary | ICD-10-CM

## 2022-01-21 DIAGNOSIS — M79605 Pain in left leg: Secondary | ICD-10-CM

## 2022-01-21 DIAGNOSIS — I10 Essential (primary) hypertension: Secondary | ICD-10-CM

## 2022-01-21 DIAGNOSIS — B1920 Unspecified viral hepatitis C without hepatic coma: Secondary | ICD-10-CM

## 2022-01-21 DIAGNOSIS — M79604 Pain in right leg: Secondary | ICD-10-CM

## 2022-01-21 DIAGNOSIS — F330 Major depressive disorder, recurrent, mild: Secondary | ICD-10-CM

## 2022-01-21 DIAGNOSIS — N184 Chronic kidney disease, stage 4 (severe): Secondary | ICD-10-CM

## 2022-01-21 DIAGNOSIS — F32A Depression, unspecified: Secondary | ICD-10-CM

## 2022-01-21 DIAGNOSIS — F1994 Other psychoactive substance use, unspecified with psychoactive substance-induced mood disorder: Secondary | ICD-10-CM

## 2022-01-21 DIAGNOSIS — G8929 Other chronic pain: Secondary | ICD-10-CM

## 2022-01-21 DIAGNOSIS — J449 Chronic obstructive pulmonary disease, unspecified: Secondary | ICD-10-CM

## 2022-01-21 MED ORDER — AMLODIPINE BESYLATE 5 MG PO TABS
5.0000 mg | ORAL_TABLET | Freq: Every day | ORAL | 3 refills | Status: DC
  Filled 2022-01-21: qty 90, 90d supply, fill #0
  Filled 2022-06-06 – 2022-07-11 (×2): qty 90, 90d supply, fill #1

## 2022-01-21 MED ORDER — PANTOPRAZOLE SODIUM 40 MG PO TBEC
40.0000 mg | DELAYED_RELEASE_TABLET | Freq: Every day | ORAL | 1 refills | Status: DC
  Filled 2022-01-21: qty 90, 90d supply, fill #0
  Filled 2022-06-06: qty 90, 90d supply, fill #1

## 2022-01-21 MED ORDER — IPRATROPIUM-ALBUTEROL 0.5-2.5 (3) MG/3ML IN SOLN
3.0000 mL | Freq: Four times a day (QID) | RESPIRATORY_TRACT | 6 refills | Status: DC | PRN
  Filled 2022-01-21: qty 360, 30d supply, fill #0

## 2022-01-21 MED ORDER — TRELEGY ELLIPTA 100-62.5-25 MCG/ACT IN AEPB
1.0000 | INHALATION_SPRAY | Freq: Every day | RESPIRATORY_TRACT | 6 refills | Status: DC
  Filled 2022-01-21: qty 1, fill #0
  Filled 2022-06-06: qty 60, 30d supply, fill #0

## 2022-01-21 MED ORDER — BUSPIRONE HCL 10 MG PO TABS
10.0000 mg | ORAL_TABLET | Freq: Two times a day (BID) | ORAL | 6 refills | Status: DC
  Filled 2022-01-21: qty 60, 30d supply, fill #0
  Filled 2022-03-08: qty 60, 30d supply, fill #1
  Filled 2022-06-06: qty 180, 90d supply, fill #1

## 2022-01-21 MED ORDER — GABAPENTIN 100 MG PO CAPS
100.0000 mg | ORAL_CAPSULE | Freq: Three times a day (TID) | ORAL | 3 refills | Status: DC
  Filled 2022-01-21: qty 11, 3d supply, fill #0
  Filled 2022-01-21: qty 79, 27d supply, fill #0
  Filled 2022-03-08 – 2022-07-11 (×3): qty 90, 30d supply, fill #1

## 2022-01-21 MED ORDER — TRAZODONE HCL 50 MG PO TABS
50.0000 mg | ORAL_TABLET | Freq: Every evening | ORAL | 3 refills | Status: DC | PRN
  Filled 2022-01-21: qty 90, 90d supply, fill #0
  Filled 2022-06-06 – 2022-07-11 (×2): qty 90, 90d supply, fill #1

## 2022-01-21 MED ORDER — FLUTICASONE-SALMETEROL 230-21 MCG/ACT IN AERO
2.0000 | INHALATION_SPRAY | Freq: Two times a day (BID) | RESPIRATORY_TRACT | 12 refills | Status: DC
  Filled 2022-01-21: qty 1, 1d supply, fill #0

## 2022-01-21 MED ORDER — ALBUTEROL SULFATE HFA 108 (90 BASE) MCG/ACT IN AERS
2.0000 | INHALATION_SPRAY | Freq: Four times a day (QID) | RESPIRATORY_TRACT | 2 refills | Status: DC | PRN
  Filled 2022-01-21: qty 6.7, 25d supply, fill #0
  Filled 2022-06-06 – 2022-07-11 (×2): qty 6.7, 25d supply, fill #1

## 2022-01-21 MED ORDER — FUROSEMIDE 40 MG PO TABS
40.0000 mg | ORAL_TABLET | Freq: Every day | ORAL | 0 refills | Status: DC | PRN
  Filled 2022-01-21: qty 30, 30d supply, fill #0
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
## 2022-01-21 NOTE — Patient Instructions (Addendum)
[facility] Infectious disease physician in [place], [place] Address: [id number], [place], [place] [address]  Closes soon ? 5?PM ? Opens 8:30?AM Tue Phone: [phone]

## 2022-01-25 ENCOUNTER — Other Ambulatory Visit: Payer: Self-pay

## 2022-01-25 MED ORDER — SODIUM BICARBONATE 650 MG PO TABS
1300.0000 mg | ORAL_TABLET | Freq: Three times a day (TID) | ORAL | 3 refills | Status: DC
Start: 2022-01-25 — End: 2022-11-02
  Filled 2022-01-25 – 2022-07-11 (×3): qty 90, 15d supply, fill #0

## 2022-01-27 ENCOUNTER — Encounter: Payer: Self-pay | Admitting: Nurse Practitioner

## 2022-01-31 ENCOUNTER — Other Ambulatory Visit: Payer: Self-pay

## 2022-02-12 ENCOUNTER — Other Ambulatory Visit: Payer: Self-pay | Admitting: *Deleted

## 2022-02-12 ENCOUNTER — Other Ambulatory Visit: Payer: Self-pay

## 2022-02-12 DIAGNOSIS — N186 End stage renal disease: Secondary | ICD-10-CM

## 2022-02-14 ENCOUNTER — Ambulatory Visit (HOSPITAL_COMMUNITY): Payer: Self-pay

## 2022-02-14 ENCOUNTER — Ambulatory Visit (HOSPITAL_COMMUNITY): Payer: Self-pay | Attending: Vascular Surgery

## 2022-02-18 ENCOUNTER — Encounter: Payer: Self-pay | Admitting: Infectious Diseases

## 2022-02-19 ENCOUNTER — Encounter: Payer: Self-pay | Admitting: Vascular Surgery

## 2022-02-19 ENCOUNTER — Ambulatory Visit (INDEPENDENT_AMBULATORY_CARE_PROVIDER_SITE_OTHER): Payer: Medicare Other | Admitting: Vascular Surgery

## 2022-02-19 ENCOUNTER — Other Ambulatory Visit (HOSPITAL_COMMUNITY): Payer: Self-pay

## 2022-02-19 ENCOUNTER — Ambulatory Visit (HOSPITAL_COMMUNITY)
Admission: RE | Admit: 2022-02-19 | Discharge: 2022-02-19 | Disposition: A | Discharge status: Home / Self Care | Payer: Medicare Other | Source: Ambulatory Visit | Attending: Vascular Surgery | Admitting: Vascular Surgery

## 2022-02-19 VITALS — BP 143/75 | HR 78 | Temp 97.6°F | Resp 16 | Ht 67.0 in | Wt 119.0 lb

## 2022-02-19 DIAGNOSIS — N185 Chronic kidney disease, stage 5: Secondary | ICD-10-CM | POA: Diagnosis not present

## 2022-02-19 DIAGNOSIS — N186 End stage renal disease: Secondary | ICD-10-CM | POA: Insufficient documentation
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
## 2022-02-21 ENCOUNTER — Encounter: Payer: Self-pay | Admitting: Infectious Diseases

## 2022-02-21 NOTE — Progress Notes (Incomplete)
[facility]                                      [address], [place], [place], [address]                                               Phn. [phone]; Fax: [phone]                                                               Date:  Reason for Visit: Hepatitis C    HPI: [name] is a 61 y.o.old female with   Denies h/o injectable or intranasal cocaine use, blood transfusion, sharing of toothbrushes/razors, or sexual contact with known positive partners, incarceration or Armed forces logistics/support/administrative officer.  No personal or family history of liver disease, Hepatitis or Liver cancer.  He has not received treatment to date   Denies any hospitalizations related to liver disease, jaundice, ascites, GI bleeding, mental status changes, abdominal pain and acholic stool.   ROS: Denies yellowish discoloration of sclera and skin, abdominal pain/distension, hematemesis.            Dcough, fever, chills, nightsweats, nausea, vomiting, diarrhea, constipation, weight loss, recent hospitalizations, rashes, joint complaints, shortness of breath, chest pain, headaches, dysuria .   '@CURMED'$ @  Allergies  Allergen Reactions   Zestril [Lisinopril] Other (See Comments)    Hyperkalemia    PMH  PSH  Social  Family  Physical exam: There were no vitals taken for this visit.  Gen:  no acute distress HEENT: [place]/AT, no scleral icterus, no pale conjunctivae, hearing normal, oral mucosa moist Neck: Supple Cardio: Regular rate and rhythm Resp: Pulmonary effort normal on room air GI: Soft, nontender, nondistended GU: MSK - no pedal edema Skin: No rashes, lesions, or ecchymoses Neuro: Grossly non focal, awake, alert and oriented * 3 Psych: Calm, cooperative   Laboratory  CBC w diff CMP PT/PTT/INR Hep A and B serologies  HIV HCV genotype HCV RNA Measure of fibrosis NS5A resistance testing in select  scenarios    Assessment/Plan:  Hepatitis C Prior treatment:  GT: Evidence of cirrhosis:  Interested in treatment: Potential DDI   Smoking/Alcohol/Illicit substance use    Counseling done on the following -Natural progression of hep c, transmission (avoid sharing personal hygiene equipment), prevention, risks of left untreated and treatment options  -Avoid hepatotoxins like alcohol and excessive acetamaminphen (no more than 2 gram a day) -Avoid eating raw sea food -Risks of re-infection  -Hepatitis coinfection and vaccination( Pneumococcal vaccination in the cirrhotics    - [place] screening with [place] every 6 months - EGD to r/o varices in cirrhotics   I have personally spent more than 70 minutes involved in face-to-face and non-face-to-face activities for this patient on the day of the visit. Professional time spent includes the following activities: Preparing to see the patient (review of tests), Obtaining and/or reviewing separately obtained history (admission/discharge record), Performing a medically appropriate examination and/or evaluation , Ordering medications/tests/procedures, referring and communicating with other health care professionals, Documenting clinical information  in the EMR, Independently interpreting results (not separately reported), Communicating results to the patient/family/caregiver, Counseling and educating the patient/family/caregiver and Care coordination (not separately reported).   Patients questions were addressed and answered.   Electronically signed by:  Rosiland Oz, MD Infectious Diseases  Office phone [phone] Fax no. [phone]

## 2022-03-04 ENCOUNTER — Other Ambulatory Visit (HOSPITAL_COMMUNITY): Payer: Self-pay

## 2022-03-05 ENCOUNTER — Encounter: Payer: Self-pay | Admitting: Infectious Diseases

## 2022-03-08 ENCOUNTER — Other Ambulatory Visit: Payer: Self-pay

## 2022-03-21 ENCOUNTER — Other Ambulatory Visit: Payer: Self-pay

## 2022-04-05 ENCOUNTER — Ambulatory Visit: Payer: Self-pay

## 2022-04-05 NOTE — Telephone Encounter (Signed)
     Chief Complaint: Cough, sinus pain. Asking to be worked in beginning of next week. Symptoms: Above Frequency: 1 week ago Pertinent Negatives: Patient denies Fever Disposition: '[]'$ ED /'[]'$ Urgent Care (no appt availability in office) / '[]'$ Appointment(In office/virtual)/ '[]'$  [facility]/ '[]'$ Home Care/ '[]'$ Refused Recommended Disposition /'[]'$ [facility]/ '[x]'$  Follow-up with PCP Additional Notes: Please advise pt.   Answer Assessment - Initial Assessment Questions 1. ONSET: "When did the cough begin?"      1 week ago 2. SEVERITY: "How bad is the cough today?"      Severe 3. SPUTUM: "Describe the color of your sputum" (none, dry cough; clear, white, yellow, green)     Clear 4. HEMOPTYSIS: "Are you coughing up any blood?" If so ask: "How much?" (flecks, streaks, tablespoons, etc.)     No 5. DIFFICULTY BREATHING: "Are you having difficulty breathing?" If Yes, ask: "How bad is it?" (e.g., mild, moderate, severe)    - MILD: No SOB at rest, mild SOB with walking, speaks normally in sentences, can lie down, no retractions, pulse < 100.    - MODERATE: SOB at rest, SOB with minimal exertion and prefers to sit, cannot lie down flat, speaks in phrases, mild retractions, audible wheezing, pulse 100-120.    - SEVERE: Very SOB at rest, speaks in single words, struggling to breathe, sitting hunched forward, retractions, pulse > 120      Mild 6. FEVER: "Do you have a fever?" If Yes, ask: "What is your temperature, how was it measured, and when did it start?"     No 7. CARDIAC HISTORY: "Do you have any history of heart disease?" (e.g., heart attack, congestive heart failure)      No 8. LUNG HISTORY: "Do you have any history of lung disease?"  (e.g., pulmonary embolus, asthma, emphysema)     COPD 9. PE RISK FACTORS: "Do you have a history of blood clots?" (or: recent major surgery, recent prolonged travel, bedridden)     No 10. OTHER SYMPTOMS: "Do you have any other symptoms?" (e.g.,  runny nose, wheezing, chest pain)       Sinus pain  11. PREGNANCY: "Is there any chance you are pregnant?" "When was your last menstrual period?"       No 12. TRAVEL: "Have you traveled out of the country in the last month?" (e.g., travel history, exposures)       No  Protocols used: Cough - Acute Non-Productive-A-AH

## 2022-04-23 ENCOUNTER — Ambulatory Visit: Payer: Self-pay | Admitting: Internal Medicine

## 2022-04-26 ENCOUNTER — Ambulatory Visit: Payer: Self-pay

## 2022-05-22 ENCOUNTER — Ambulatory Visit: Payer: Self-pay | Admitting: Physician Assistant

## 2022-05-22 ENCOUNTER — Ambulatory Visit: Payer: Self-pay | Admitting: *Deleted

## 2022-05-22 NOTE — Telephone Encounter (Signed)
Attempted to return her call.    Left a voicemail to all back.

## 2022-05-22 NOTE — Telephone Encounter (Signed)
Message from [place] sent at 05/22/2022  1:13 PM EST  Summary: bumps underneath arms   Pt stated that she has been experiencing bumps underneath her arms that started to pop up three weeks ago. She stated that it's like a blister; they pop, and she puts alcohol on them to dry them off. Unsure of why they are reoccurring.  Pt had an appointment today and requested to cancel due to transportation declined MyChart video visit.  Rescheduled for 03/21.  Seeking clinical advice.          Call History   Type Contact Phone/Fax User  05/22/2022 01:10 PM EST Phone ([address]) [place], [place] (Self) [phone] Jerilynn Mages) [place], [place]

## 2022-05-22 NOTE — Telephone Encounter (Signed)
Third attempt to reach pt, line connects then immediately disconnects. Routing to provider for resolution after third attempt per protocol.

## 2022-05-22 NOTE — Telephone Encounter (Signed)
2nd attempt to return call without success she answers but the line disconnects.    Tried again and the same thing happened.   She answers then the line disconnects. Will try again later.

## 2022-06-06 ENCOUNTER — Ambulatory Visit: Payer: Self-pay | Attending: Internal Medicine | Admitting: Physician Assistant

## 2022-06-06 ENCOUNTER — Other Ambulatory Visit: Payer: Self-pay

## 2022-06-06 ENCOUNTER — Encounter: Payer: Self-pay | Admitting: Physician Assistant

## 2022-06-06 VITALS — BP 189/79 | HR 78 | Wt 115.4 lb

## 2022-06-06 DIAGNOSIS — D508 Other iron deficiency anemias: Secondary | ICD-10-CM

## 2022-06-06 DIAGNOSIS — L0292 Furuncle, unspecified: Secondary | ICD-10-CM

## 2022-06-06 DIAGNOSIS — Z91199 Patient's noncompliance with other medical treatment and regimen due to unspecified reason: Secondary | ICD-10-CM

## 2022-06-06 DIAGNOSIS — M79605 Pain in left leg: Secondary | ICD-10-CM

## 2022-06-06 DIAGNOSIS — K219 Gastro-esophageal reflux disease without esophagitis: Secondary | ICD-10-CM

## 2022-06-06 DIAGNOSIS — E871 Hypo-osmolality and hyponatremia: Secondary | ICD-10-CM

## 2022-06-06 DIAGNOSIS — M79604 Pain in right leg: Secondary | ICD-10-CM

## 2022-06-06 DIAGNOSIS — M7989 Other specified soft tissue disorders: Secondary | ICD-10-CM

## 2022-06-06 DIAGNOSIS — N184 Chronic kidney disease, stage 4 (severe): Secondary | ICD-10-CM

## 2022-06-06 DIAGNOSIS — I129 Hypertensive chronic kidney disease with stage 1 through stage 4 chronic kidney disease, or unspecified chronic kidney disease: Secondary | ICD-10-CM

## 2022-06-06 MED ORDER — FUROSEMIDE 40 MG PO TABS
40.0000 mg | ORAL_TABLET | Freq: Every day | ORAL | 1 refills | Status: DC | PRN
  Filled 2022-06-06 – 2022-07-11 (×2): qty 30, 30d supply, fill #0

## 2022-06-06 MED ORDER — MUPIROCIN 2 % EX OINT
1.0000 | TOPICAL_OINTMENT | Freq: Two times a day (BID) | CUTANEOUS | 0 refills | Status: DC
  Filled 2022-06-06 – 2022-07-11 (×2): qty 22, 11d supply, fill #0

## 2022-06-06 MED ORDER — TRAMADOL HCL 50 MG PO TABS
50.0000 mg | ORAL_TABLET | Freq: Three times a day (TID) | ORAL | 0 refills | Status: AC | PRN
  Filled 2022-06-06 – 2022-07-11 (×2): qty 15, 5d supply, fill #0

## 2022-06-06 MED ORDER — PANTOPRAZOLE SODIUM 40 MG PO TBEC
40.0000 mg | DELAYED_RELEASE_TABLET | Freq: Every day | ORAL | 1 refills | Status: DC
  Filled 2022-07-11: qty 90, 90d supply, fill #0

## 2022-06-06 NOTE — Progress Notes (Signed)
Patient ID: [name], female   DOB: [birth year], 62 y.o.   MRN: [id number]   [name], is a 62 y.o. female  [id number]  [id number]  DOB - 1960/09/15  Chief Complaint  Patient presents with   skin irritation        Subjective:   [name] is a 62 y.o. female here today for boils in the axilla that are ongoing.  Seen by [facility] for stage 5 kidney disease-being assessed for A-V fistula but has poor venous access and may require a shunt.    She is requesting pain medicine for when her legs swell and exacerbate neuropathy.    Has not had CBC since transfusion 01/2022  No problems updated.  ALLERGIES: Allergies  Allergen Reactions   Zestril [Lisinopril] Other (See Comments)    Hyperkalemia    PAST MEDICAL HISTORY: Past Medical History:  Diagnosis Date   Alcohol abuse    Allergy    Anxiety    Arthritis    Asthma    Cannabis abuse    Cocaine abuse (HCC)    COPD (chronic obstructive pulmonary disease) (HCC)    Depression    Drug addiction (HCC)    GERD (gastroesophageal reflux disease)    Heart murmur    Homelessness    Hyperlipidemia    Hypertension    pt stated "every once in a while BP will be high but has not been prescribed medication for HTN.    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 Authorizing Provider  mupirocin ointment (BACTROBAN) 2 % Apply 1 Application topically 2 (two) times daily. 06/06/22  Yes Si Jachim, Dionne Bucy, PA-C  traMADol (ULTRAM) 50 MG tablet Take 1 tablet (50 mg total) by mouth every 8 (eight) hours as needed for up to 5 days. 06/06/22 06/11/22 Yes Betsaida [place], Dionne Bucy, PA-C  albuterol (VENTOLIN HFA) 108 (90 Base) MCG/ACT inhaler INHALE 2 PUFFS INTO THE LUNGS EVERY 6 HOURS AS NEEDED FOR  WHEEZING OR SHORTNESS OF BREATH. 01/21/22   Gildardo Pounds, NP  amLODipine (NORVASC) 5 MG tablet Take 1 tablet (5 mg total) by mouth daily. 01/21/22   Gildardo Pounds, NP  busPIRone (BUSPAR) 10 MG tablet Take 1 tablet (10 mg total) by mouth 2 (two) times daily. 01/21/22   Gildardo Pounds, NP  Fluticasone-Umeclidin-Vilant (TRELEGY ELLIPTA) 100-62.5-25 MCG/ACT AEPB Inhale 1 puff into the lungs daily. 01/21/22   Gildardo Pounds, NP  furosemide (LASIX) 40 MG tablet Take 1 tablet (40 mg total) by mouth daily as needed for edema or fluid (as needed for leg swelling). 06/06/22   Argentina Donovan, PA-C  gabapentin (NEURONTIN) 100 MG capsule Take 1 capsule (100 mg total) by mouth 3 (three) times daily. 01/21/22   Gildardo Pounds, NP  ipratropium-albuterol (DUONEB) 0.5-2.5 (3) MG/3ML SOLN Take 3 mLs by nebulization every 6 (six) hours as needed (shortness of breath and wheezing). 01/21/22   Gildardo Pounds, NP  Multiple Vitamin (MULTIVITAMIN WITH MINERALS) TABS tablet Take 1 tablet by mouth daily. 06/29/20   Ladell Pier, MD  pantoprazole (PROTONIX) 40 MG tablet Take 1 tablet (40 mg total) by mouth daily. 06/06/22   Argentina Donovan, PA-C  sodium bicarbonate 650 MG tablet  Take 2 tablets (1,300 mg total) by mouth 3 (three) times daily. 01/25/22     traZODone (DESYREL) 50 MG tablet Take 1 tablet (50 mg total) by mouth at bedtime as needed for sleep. 01/21/22   Gildardo Pounds, NP    ROS: Neg HEENT Neg resp Neg cardiac Neg GI Neg GU Neg psych Neg neuro  Objective:   Vitals:   06/06/22 1346  BP: (!) 189/79  Pulse: 78  SpO2: 96%  Weight: 115 lb 6.4 oz (52.3 kg)   Exam General appearance : Awake, alert, not in any distress. Speech Clear. Not toxic looking;  poor health.  underweight HEENT: Atraumatic and Normocephalic Neck: Supple, no JVD. No cervical lymphadenopathy.  Chest: Good air entry bilaterally, CTAB.  No rales/rhonchi/wheezing CVS: S1 S2 regular, no murmurs.  B axilla with  supprative hydradenitis  Extremities: B/L Lower Ext shows mild edema, both legs are warm to touch Neurology: Awake alert, and oriented X 3, CN II-XII intact, Non focal Skin: No Rash  Data Review Lab Results  Component Value Date   HGBA1C 5.6 08/20/2019   HGBA1C 5.20 04/21/2015    Assessment & Plan   1. 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  Take amlodipine - CBC with Differential/Platelet - furosemide (LASIX) 40 MG tablet; Take 1 tablet (40 mg total) by mouth daily as needed for edema or fluid (as needed for leg swelling).  Dispense: 30 tablet; Refill: 1 - Comprehensive metabolic panel  2. Gastroesophageal reflux disease without esophagitis - pantoprazole (PROTONIX) 40 MG tablet; Take 1 tablet (40 mg total) by mouth daily.  Dispense: 90 tablet; Refill: 1  3. Boils - mupirocin ointment (BACTROBAN) 2 %; Apply 1 Application topically 2 (two) times daily.  Dispense: 22 g; Refill: 0  4. Leg swelling Due to CKD - traMADol (ULTRAM) 50 MG tablet; Take 1 tablet (50 mg total) by mouth every 8 (eight) hours as needed for up to 5 days.  Dispense: 15 tablet; Refill: 0  5. Hyponatremia On salt tablets-due to chronic disease - Comprehensive metabolic panel  6. Pain in both lower extremities - traMADol (ULTRAM) 50 MG tablet; Take 1 tablet (50 mg total) by mouth every 8 (eight) hours as needed for up to 5 days.  Dispense: 15 tablet; Refill: 0 Use sparingly  7. Other iron deficiency anemia - CBC with Differential/Platelet  8. Noncompliance Compliance imperative. Discussed at length.     Return in about 3 months (around 09/06/2022) for PCP for chronic conditions.  The patient was given clear instructions to go to ER or return to medical center if symptoms don't improve, worsen or new problems develop. The patient verbalized understanding. The patient was told to call to get lab results if they haven't heard anything in the next  week.      Freeman Caldron, PA-C [facility] [place], [place]   06/06/2022, 2:00 PM

## 2022-06-07 ENCOUNTER — Other Ambulatory Visit: Payer: Self-pay

## 2022-06-07 ENCOUNTER — Telehealth: Payer: Self-pay | Admitting: Family Medicine

## 2022-06-07 ENCOUNTER — Emergency Department (HOSPITAL_COMMUNITY)
Admission: EM | Admit: 2022-06-07 | Discharge: 2022-06-07 | Disposition: A | Discharge status: Home / Self Care | Payer: Self-pay | Attending: Emergency Medicine | Admitting: Emergency Medicine

## 2022-06-07 ENCOUNTER — Encounter (HOSPITAL_COMMUNITY): Payer: Self-pay | Admitting: Emergency Medicine

## 2022-06-07 DIAGNOSIS — I120 Hypertensive chronic kidney disease with stage 5 chronic kidney disease or end stage renal disease: Secondary | ICD-10-CM | POA: Insufficient documentation

## 2022-06-07 DIAGNOSIS — D649 Anemia, unspecified: Secondary | ICD-10-CM | POA: Insufficient documentation

## 2022-06-07 DIAGNOSIS — Z79899 Other long term (current) drug therapy: Secondary | ICD-10-CM | POA: Insufficient documentation

## 2022-06-07 DIAGNOSIS — R600 Localized edema: Secondary | ICD-10-CM | POA: Insufficient documentation

## 2022-06-07 DIAGNOSIS — E871 Hypo-osmolality and hyponatremia: Secondary | ICD-10-CM | POA: Insufficient documentation

## 2022-06-07 DIAGNOSIS — N186 End stage renal disease: Secondary | ICD-10-CM | POA: Insufficient documentation

## 2022-06-07 DIAGNOSIS — J449 Chronic obstructive pulmonary disease, unspecified: Secondary | ICD-10-CM | POA: Insufficient documentation

## 2022-06-07 DIAGNOSIS — F17210 Nicotine dependence, cigarettes, uncomplicated: Secondary | ICD-10-CM | POA: Insufficient documentation

## 2022-06-07 DIAGNOSIS — E1122 Type 2 diabetes mellitus with diabetic chronic kidney disease: Secondary | ICD-10-CM | POA: Insufficient documentation

## 2022-06-07 LAB — COMPREHENSIVE METABOLIC PANEL
ALT: 11 IU/L (ref 0–32)
ALT: 17 U/L (ref 0–44)
AST: 26 IU/L (ref 0–40)
AST: 26 U/L (ref 15–41)
Albumin/Globulin Ratio: 1.1 — ABNORMAL LOW (ref 1.2–2.2)
Albumin: 3.9 g/dL (ref 3.9–4.9)
Albumin: 3.4 g/dL — ABNORMAL LOW (ref 3.5–5.0)
Alkaline Phosphatase: 99 IU/L (ref 44–121)
Alkaline Phosphatase: 86 U/L (ref 38–126)
Anion gap: 16 — ABNORMAL HIGH (ref 5–15)
BUN/Creatinine Ratio: 9 — ABNORMAL LOW (ref 12–28)
BUN: 37 mg/dL — ABNORMAL HIGH (ref 8–27)
BUN: 34 mg/dL — ABNORMAL HIGH (ref 8–23)
Bilirubin Total: <0.2 mg/dL (ref 0.0–1.2)
CO2: 13 mmol/L — ABNORMAL LOW (ref 20–29)
CO2: 10 mmol/L — ABNORMAL LOW (ref 22–32)
Calcium: 8.7 mg/dL (ref 8.7–10.3)
Calcium: 8.7 mg/dL — ABNORMAL LOW (ref 8.9–10.3)
Chloride: 106 mmol/L (ref 96–106)
Chloride: 107 mmol/L (ref 98–111)
Creatinine, Ser: 4.14 mg/dL — ABNORMAL HIGH (ref 0.57–1.00)
Creatinine, Ser: 4.07 mg/dL — ABNORMAL HIGH (ref 0.44–1.00)
GFR, Estimated: 12 mL/min — ABNORMAL LOW (ref >60)
Globulin, Total: 3.7 g/dL (ref 1.5–4.5)
Glucose, Bld: 100 mg/dL — ABNORMAL HIGH (ref 70–99)
Glucose: 99 mg/dL (ref 70–99)
Potassium: 4.1 mmol/L (ref 3.5–5.2)
Potassium: 3.5 mmol/L (ref 3.5–5.1)
Sodium: 137 mmol/L (ref 134–144)
Sodium: 133 mmol/L — ABNORMAL LOW (ref 135–145)
Total Bilirubin: 0.7 mg/dL (ref 0.3–1.2)
Total Protein: 7.6 g/dL (ref 6.0–8.5)
Total Protein: 8.1 g/dL (ref 6.5–8.1)
eGFR: 12 mL/min/{1.73_m2} — ABNORMAL LOW (ref >59)

## 2022-06-07 LAB — CBC WITH DIFFERENTIAL/PLATELET
Abs Immature Granulocytes: 0.02 10*3/uL (ref 0.00–0.07)
Basophils Absolute: 0 10*3/uL (ref 0.0–0.2)
Basophils Absolute: 0 10*3/uL (ref 0.0–0.1)
Basophils Relative: 1 %
Basos: 1 %
EOS (ABSOLUTE): 0.4 10*3/uL (ref 0.0–0.4)
Eos: 6 %
Eosinophils Absolute: 0.4 10*3/uL (ref 0.0–0.5)
Eosinophils Relative: 7 %
HCT: 18.9 % — ABNORMAL LOW (ref 36.0–46.0)
Hematocrit: 18.1 % — ABNORMAL LOW (ref 34.0–46.6)
Hemoglobin: 6 g/dL — CL (ref 11.1–15.9)
Hemoglobin: 6.1 g/dL — CL (ref 12.0–15.0)
Immature Grans (Abs): 0 10*3/uL (ref 0.0–0.1)
Immature Granulocytes: 1 %
Immature Granulocytes: 0 %
Lymphocytes Absolute: 1.3 10*3/uL (ref 0.7–3.1)
Lymphocytes Relative: 23 %
Lymphs Abs: 1.4 10*3/uL (ref 0.7–4.0)
Lymphs: 19 %
MCH: 31.9 pg (ref 26.6–33.0)
MCH: 32.6 pg (ref 26.0–34.0)
MCHC: 33.1 g/dL (ref 31.5–35.7)
MCHC: 32.3 g/dL (ref 30.0–36.0)
MCV: 96 fL (ref 79–97)
MCV: 101.1 fL — ABNORMAL HIGH (ref 80.0–100.0)
Monocytes Absolute: 0.5 10*3/uL (ref 0.1–0.9)
Monocytes Absolute: 0.5 10*3/uL (ref 0.1–1.0)
Monocytes Relative: 8 %
Monocytes: 7 %
Neutro Abs: 3.8 10*3/uL (ref 1.7–7.7)
Neutrophils Absolute: 4.4 10*3/uL (ref 1.4–7.0)
Neutrophils Relative %: 61 %
Neutrophils: 66 %
Platelets: 353 10*3/uL (ref 150–450)
Platelets: 323 10*3/uL (ref 150–400)
RBC: 1.88 x10E6/uL — CL (ref 3.77–5.28)
RBC: 1.87 MIL/uL — ABNORMAL LOW (ref 3.87–5.11)
RDW: 13.6 % (ref 11.7–15.4)
RDW: 15.3 % (ref 11.5–15.5)
WBC: 6.6 10*3/uL (ref 3.4–10.8)
WBC: 6.1 10*3/uL (ref 4.0–10.5)
nRBC: 0 % (ref 0.0–0.2)

## 2022-06-07 LAB — PREPARE RBC (CROSSMATCH)

## 2022-06-07 LAB — BASIC METABOLIC PANEL
Anion gap: 12 (ref 5–15)
BUN: 37 mg/dL — ABNORMAL HIGH (ref 8–23)
CO2: 12 mmol/L — ABNORMAL LOW (ref 22–32)
Calcium: 8 mg/dL — ABNORMAL LOW (ref 8.9–10.3)
Chloride: 111 mmol/L (ref 98–111)
Creatinine, Ser: 3.97 mg/dL — ABNORMAL HIGH (ref 0.44–1.00)
GFR, Estimated: 12 mL/min — ABNORMAL LOW (ref >60)
Glucose, Bld: 89 mg/dL (ref 70–99)
Potassium: 3.5 mmol/L (ref 3.5–5.1)
Sodium: 135 mmol/L (ref 135–145)

## 2022-06-07 LAB — OCCULT BLOOD X 1 CARD TO LAB, STOOL: Fecal Occult Bld: NEGATIVE

## 2022-06-07 MED ORDER — TRAMADOL HCL 50 MG PO TABS
50.0000 mg | ORAL_TABLET | Freq: Once | ORAL | Status: AC
  Administered 2022-06-07: 50 mg via ORAL
  Filled 2022-06-07: qty 1

## 2022-06-07 MED ORDER — AMLODIPINE BESYLATE 5 MG PO TABS
5.0000 mg | ORAL_TABLET | Freq: Once | ORAL | Status: AC
  Administered 2022-06-07: 5 mg via ORAL
  Filled 2022-06-07: qty 1

## 2022-06-07 MED ORDER — ACETAMINOPHEN 325 MG PO TABS
650.0000 mg | ORAL_TABLET | Freq: Once | ORAL | Status: AC
  Administered 2022-06-07: 650 mg via ORAL
  Filled 2022-06-07: qty 2

## 2022-06-07 MED ORDER — BUSPIRONE HCL 10 MG PO TABS
10.0000 mg | ORAL_TABLET | Freq: Once | ORAL | Status: AC
  Administered 2022-06-07: 10 mg via ORAL
  Filled 2022-06-07: qty 1

## 2022-06-07 MED ORDER — NICOTINE 14 MG/24HR TD PT24
14.0000 mg | MEDICATED_PATCH | Freq: Once | TRANSDERMAL | Status: DC
  Administered 2022-06-07: 14 mg via TRANSDERMAL
  Filled 2022-06-07: qty 1

## 2022-06-07 MED ORDER — SODIUM CHLORIDE 0.9% IV SOLUTION: Freq: Once | INTRAVENOUS | Status: DC

## 2022-06-07 NOTE — ED Notes (Signed)
Got patient into a gown on the monitor did EKG patient is resting with call bell in reach  

## 2022-06-07 NOTE — ED Provider Notes (Signed)
[facility] Provider Note   CSN: [id number] Arrival date & time: 06/07/22  [id number]     History  Chief Complaint  Patient presents with   Weakness    [name] is a 62 y.o. female.  HPI   62 year old female presents emergency department with complaints of abnormal lab.  Patient states that she had routine labs drawn by her primary care provider yesterday during a visit for rash in axillary region which noted hemoglobin of 6.0.  Patient states she has history of recurrent anemia needing transfusion but occurs almost yearly in nature.  States that she feels short of breath at baseline "due to COPD and chronic smoking" and states that this has remained stable throughout the recent days/weeks.  States that she has felt more tired than usual.  Denies fever, chills, night sweats, chest pain, abdominal pain, nausea, vomiting, urinary symptoms, change in bowel habits, melena/hematochezia.  Patient states she has known history of stage V kidney disease but has not been to see the nephrologist/vascular surgeon for AV graft placement since being seen priorly on December of last year.   Past medical history significant for COPD, cannabis use, cocaine abuse, GERD, hyperlipidemia, hypertension, diabetes mellitus, ESRD not on dialysis, nephrotic syndrome, iron deficiency anemia, chronic hepatitis C without hepatic coma, chronic leg pain, chronic leg swelling  Home Medications Prior to Admission medications   Medication Sig Start Date End Date Taking? Authorizing Provider  albuterol (VENTOLIN HFA) 108 (90 Base) MCG/ACT inhaler INHALE 2 PUFFS INTO THE LUNGS EVERY 6 HOURS AS NEEDED FOR WHEEZING OR SHORTNESS OF BREATH. 01/21/22   Gildardo Pounds, NP  amLODipine (NORVASC) 5 MG tablet Take 1 tablet (5 mg total) by mouth daily. 01/21/22   Gildardo Pounds, NP  busPIRone (BUSPAR) 10 MG tablet Take 1 tablet (10 mg total) by mouth 2 (two) times daily. 01/21/22    Gildardo Pounds, NP  Fluticasone-Umeclidin-Vilant (TRELEGY ELLIPTA) 100-62.5-25 MCG/ACT AEPB Inhale 1 puff into the lungs daily. 01/21/22   Gildardo Pounds, NP  furosemide (LASIX) 40 MG tablet Take 1 tablet (40 mg total) by mouth daily as needed for edema or fluid (as needed for leg swelling). 06/06/22   Argentina Donovan, PA-C  gabapentin (NEURONTIN) 100 MG capsule Take 1 capsule (100 mg total) by mouth 3 (three) times daily. 01/21/22   Gildardo Pounds, NP  ipratropium-albuterol (DUONEB) 0.5-2.5 (3) MG/3ML SOLN Take 3 mLs by nebulization every 6 (six) hours as needed (shortness of breath and wheezing). 01/21/22   Gildardo Pounds, NP  Multiple Vitamin (MULTIVITAMIN WITH MINERALS) TABS tablet Take 1 tablet by mouth daily. 06/29/20   Ladell Pier, MD  mupirocin ointment (BACTROBAN) 2 % Apply 1 Application topically 2 (two) times daily. 06/06/22   Argentina Donovan, PA-C  pantoprazole (PROTONIX) 40 MG tablet Take 1 tablet (40 mg total) by mouth daily. 06/06/22   Argentina Donovan, PA-C  sodium bicarbonate 650 MG tablet Take 2 tablets (1,300 mg total) by mouth 3 (three) times daily. 01/25/22     traMADol (ULTRAM) 50 MG tablet Take 1 tablet (50 mg total) by mouth every 8 (eight) hours as needed for up to 5 days. 06/06/22 06/11/22  Argentina Donovan, PA-C  traZODone (DESYREL) 50 MG tablet Take 1 tablet (50 mg total) by mouth at bedtime as needed for sleep. 01/21/22   Gildardo Pounds, NP      Allergies    Zestril [lisinopril]  Review of Systems   Review of Systems  All other systems reviewed and are negative.   Physical Exam Updated Vital Signs BP (!) 182/79   Pulse 86   Temp 98.2 F (36.8 C) (Oral)   Resp 15   Ht 5' 7.5" (1.715 m)   Wt 51.3 kg   SpO2 100%   BMI 17.44 kg/m  Physical Exam Vitals and nursing note reviewed.  Constitutional:      General: She is not in acute distress.    Appearance: She is well-developed.     Comments: Patient frail-appearing  HENT:     Head:  Normocephalic and atraumatic.  Eyes:     Conjunctiva/sclera: Conjunctivae normal.  Cardiovascular:     Rate and Rhythm: Normal rate and regular rhythm.     Heart sounds: No murmur heard. Pulmonary:     Effort: Pulmonary effort is normal. No respiratory distress.     Comments: Mild wheeze appreciated bilaterally in fields. Abdominal:     Palpations: Abdomen is soft.     Tenderness: There is no abdominal tenderness. There is no right CVA tenderness or left CVA tenderness.  Genitourinary:    Comments: Patient without external hemorrhoid or palpable internal hemorrhoid/fissure.  No obvious gross bleeding or melena appreciated.  Stool sample obtained and sent via Hemoccult for result. Musculoskeletal:        General: No swelling.     Cervical back: Neck supple. No rigidity or tenderness.     Right lower leg: Edema present.     Left lower leg: Edema present.     Comments: 1-2+ bilateral lower extremity edema.  Skin:    General: Skin is warm and dry.     Capillary Refill: Capillary refill takes less than 2 seconds.  Neurological:     Mental Status: She is alert.  Psychiatric:        Mood and Affect: Mood normal.     ED Results / Procedures / Treatments   Labs (all labs ordered are listed, but only abnormal results are displayed) Labs Reviewed  COMPREHENSIVE METABOLIC PANEL - Abnormal; Notable for the following components:      Result Value   Sodium 133 (*)    CO2 10 (*)    Glucose, Bld 100 (*)    BUN 34 (*)    Creatinine, Ser 4.07 (*)    Calcium 8.7 (*)    Albumin 3.4 (*)    GFR, Estimated 12 (*)    Anion gap 16 (*)    All other components within normal limits  CBC WITH DIFFERENTIAL/PLATELET - Abnormal; Notable for the following components:   RBC 1.87 (*)    Hemoglobin 6.1 (*)    HCT 18.9 (*)    MCV 101.1 (*)    All other components within normal limits  BASIC METABOLIC PANEL - Abnormal; Notable for the following components:   CO2 12 (*)    BUN 37 (*)    Creatinine,  Ser 3.97 (*)    Calcium 8.0 (*)    GFR, Estimated 12 (*)    All other components within normal limits  OCCULT BLOOD X 1 CARD TO LAB, STOOL  OCCULT BLOOD X 1 CARD TO LAB, STOOL  TYPE AND SCREEN  PREPARE RBC (CROSSMATCH)    EKG EKG Interpretation  Date/Time:  Friday June 07 2022 10:05:51 EDT Ventricular Rate:  80 PR Interval:  152 QRS Duration: 87 QT Interval:  410 QTC Calculation: 473 R Axis:   63 Text Interpretation: Sinus rhythm  no acute ST/T changes no significant change since Aug 2023 Confirmed by Sherwood Gambler [phone]) on 06/07/2022 11:18:41 AM  Radiology No results found.  Procedures Procedures    Medications Ordered in ED Medications  0.9 %  sodium chloride infusion (Manually program via Guardrails IV Fluids) ( Intravenous Not Given 06/07/22 1148)  nicotine (NICODERM CQ - dosed in mg/24 hours) patch 14 mg (14 mg Transdermal Patch Applied 06/07/22 1213)  traMADol (ULTRAM) tablet 50 mg (50 mg Oral Given 06/07/22 1432)  acetaminophen (TYLENOL) tablet 650 mg (650 mg Oral Given 06/07/22 1433)  amLODipine (NORVASC) tablet 5 mg (5 mg Oral Given 06/07/22 1432)  busPIRone (BUSPAR) tablet 10 mg (10 mg Oral Given 06/07/22 1432)    ED Course/ Medical Decision Making/ A&P                             Medical Decision Making Amount and/or Complexity of Data Reviewed Labs: ordered.  Risk OTC drugs. Prescription drug management.   This patient presents to the ED for concern of abnormal lab, this involves an extensive number of treatment options, and is a complaint that carries with it a high risk of complications and morbidity.  The differential diagnosis includes anemia   Co morbidities that complicate the patient evaluation  See HPI   Additional history obtained:  Additional history obtained from EMR External records from outside source obtained and reviewed including hospital records   Lab Tests:  I Ordered, and personally interpreted labs.  The pertinent  results include: Patient with evidence of anemia with a hemoglobin of 6.1.  Previously within normal range.  No leukocytosis noted.  CMP significant for hyponatremia of 133, decreased bicarb 10, anion gap of 16.  Patient with baseline renal function with creatinine 4.07, BUN of 34 and GFR of 12.  No transaminitis.  Fecal occult blood negative.  Repeat metabolic panel showed improvement in patient's sodium as well as bicarb and anion gap of 135, 12 and 12 respectively.  Patient seems to have baseline decrease bicarb from prior laboratory studies performed   Imaging Studies ordered:  N/a   Cardiac Monitoring: / EKG:  The patient was maintained on a cardiac monitor.  I personally viewed and interpreted the cardiac monitored which showed an underlying rhythm of: Sinus rhythm with acute ischemic changes from prior EKGs performed.   Consultations Obtained:  I requested consultation with Dr. Regenia Skeeter who is in agreement with treatment plan going forward.   Problem List / ED Course / Critical interventions / Medication management  Anemia I ordered medication including Tylenol, Ultram, BuSpar, Norvasc   Reevaluation of the patient after these medicines showed that the patient improved I have reviewed the patients home medicines and have made adjustments as needed   Social Determinants of Health:  Chronic cigarette use.  Denies illicit drug use.   Test / Admission - Considered:  Anemia Vitals signs significant for hypertension without at home antihypertensive with blood pressure of 187/78; patient without at home medications including antihypertensives which will become while in the emergency department prior to patient's discharge.. Otherwise within normal range and stable throughout visit. Laboratory/imaging studies significant for: See above Patient presents emergency department with concerns for anemia.  Patient's hemoglobin of 6.1.  Seem to be anemia from chronic disease given  patient's ckd.  No evidence of acute blood loss per patient's history with negative hemoccult.  Patient transfused with 1 unit of blood while in the ED.  Reassessment of the patient showed patient resting comfortably, tolerating p.o. without difficulty.  She states that her symptoms have significantly improved with administration of blood.  Shared decision-making conversation was had with patient regarding admission given abnormal laboratory studies in addition to patient's symptomatic anemia, but patient declined at this time for outpatient oral supplementation via electrolyte rich fluids.  Patient recommended close follow-up with primary care for reassessment of symptoms.  Treatment plan discussed at length with patient and she acknowledged understanding was agreeable to said plan. Worrisome signs and symptoms were discussed with the patient, and the patient acknowledged understanding to return to the ED if noticed. Patient was stable upon discharge.          Final Clinical Impression(s) / ED Diagnoses Final diagnoses:  Anemia, unspecified type    Rx / DC Orders ED Discharge Orders     None         Wilnette Kales, [place] 06/07/22 1912    Sherwood Gambler, MD 06/10/22 1755

## 2022-06-07 NOTE — ED Triage Notes (Signed)
Pt reports she got call from pcp due to hbg being low. Chronic swelling of legs. States been extremely tired lately.

## 2022-06-07 NOTE — Telephone Encounter (Signed)
Received call at 1am about critical Hgb of 6 from after hours RN. Advised RN to direct patient to go to to the ED for transfusion. Please check to ensure patient followed through. Thanks

## 2022-06-07 NOTE — Discharge Instructions (Signed)
Note that your visit today was consistent with.  We have given you 1 unit of blood.  Recommend continuing your at home medical regimen.  Recommend follow-up with primary care as well as vascular surgery for placement of dialysis access.  Please do not hesitate to return to emergency department if the worrisome signs and symptoms we discussed become apparent.

## 2022-06-08 LAB — BPAM RBC
Blood Product Expiration Date: 202403292359
ISSUE DATE / TIME: 202403221148
Unit Type and Rh: 6200

## 2022-06-08 LAB — TYPE AND SCREEN
ABO/RH(D): A POS
Antibody Screen: NEGATIVE
Unit division: 0

## 2022-06-10 ENCOUNTER — Telehealth: Payer: Self-pay

## 2022-06-10 NOTE — Telephone Encounter (Signed)
-----   Message from Argentina Donovan, [place] sent at 06/07/2022  9:31 AM EDT ----- Please call patient ASAP and ask her if she went to the ED for transfusion for very low hemoglobin? If she has not, please tell her this is extremely important. Please let me know. Thanks, Freeman Caldron, PA-C

## 2022-06-10 NOTE — Telephone Encounter (Signed)
Pt was called and vm was left, Information has been sent to nurse pool.   

## 2022-06-13 ENCOUNTER — Other Ambulatory Visit: Payer: Self-pay

## 2022-06-21 ENCOUNTER — Other Ambulatory Visit: Payer: Self-pay

## 2022-06-24 ENCOUNTER — Other Ambulatory Visit: Payer: Self-pay

## 2022-06-26 ENCOUNTER — Other Ambulatory Visit: Payer: Self-pay

## 2022-07-11 ENCOUNTER — Other Ambulatory Visit: Payer: Self-pay

## 2022-07-18 ENCOUNTER — Other Ambulatory Visit: Payer: Self-pay

## 2022-08-01 ENCOUNTER — Inpatient Hospital Stay (HOSPITAL_COMMUNITY)
Admission: EM | Admit: 2022-08-01 | Discharge: 2022-08-05 | DRG: 812 | Disposition: A | Discharge status: Home / Self Care | Payer: Medicare Other | Attending: Internal Medicine | Admitting: Internal Medicine

## 2022-08-01 ENCOUNTER — Emergency Department (HOSPITAL_COMMUNITY): Payer: Medicare Other

## 2022-08-01 ENCOUNTER — Other Ambulatory Visit: Payer: Self-pay

## 2022-08-01 ENCOUNTER — Encounter (HOSPITAL_COMMUNITY): Payer: Self-pay | Admitting: *Deleted

## 2022-08-01 DIAGNOSIS — I5032 Chronic diastolic (congestive) heart failure: Secondary | ICD-10-CM | POA: Diagnosis present

## 2022-08-01 DIAGNOSIS — D509 Iron deficiency anemia, unspecified: Principal | ICD-10-CM | POA: Diagnosis present

## 2022-08-01 DIAGNOSIS — D649 Anemia, unspecified: Secondary | ICD-10-CM

## 2022-08-01 DIAGNOSIS — N184 Chronic kidney disease, stage 4 (severe): Secondary | ICD-10-CM | POA: Diagnosis present

## 2022-08-01 DIAGNOSIS — F17210 Nicotine dependence, cigarettes, uncomplicated: Secondary | ICD-10-CM | POA: Diagnosis present

## 2022-08-01 DIAGNOSIS — I130 Hypertensive heart and chronic kidney disease with heart failure and stage 1 through stage 4 chronic kidney disease, or unspecified chronic kidney disease: Secondary | ICD-10-CM | POA: Diagnosis present

## 2022-08-01 DIAGNOSIS — Z5901 Sheltered homelessness: Secondary | ICD-10-CM | POA: Diagnosis not present

## 2022-08-01 DIAGNOSIS — Z79899 Other long term (current) drug therapy: Secondary | ICD-10-CM

## 2022-08-01 DIAGNOSIS — E1122 Type 2 diabetes mellitus with diabetic chronic kidney disease: Secondary | ICD-10-CM | POA: Diagnosis present

## 2022-08-01 DIAGNOSIS — E8722 Chronic metabolic acidosis: Secondary | ICD-10-CM | POA: Diagnosis present

## 2022-08-01 DIAGNOSIS — N179 Acute kidney failure, unspecified: Secondary | ICD-10-CM | POA: Diagnosis present

## 2022-08-01 DIAGNOSIS — E785 Hyperlipidemia, unspecified: Secondary | ICD-10-CM | POA: Diagnosis present

## 2022-08-01 DIAGNOSIS — J4489 Other specified chronic obstructive pulmonary disease: Secondary | ICD-10-CM | POA: Diagnosis present

## 2022-08-01 DIAGNOSIS — K219 Gastro-esophageal reflux disease without esophagitis: Secondary | ICD-10-CM | POA: Diagnosis present

## 2022-08-01 DIAGNOSIS — D631 Anemia in chronic kidney disease: Secondary | ICD-10-CM | POA: Diagnosis present

## 2022-08-01 DIAGNOSIS — F419 Anxiety disorder, unspecified: Secondary | ICD-10-CM | POA: Diagnosis present

## 2022-08-01 DIAGNOSIS — F32A Depression, unspecified: Secondary | ICD-10-CM | POA: Diagnosis present

## 2022-08-01 DIAGNOSIS — K922 Gastrointestinal hemorrhage, unspecified: Secondary | ICD-10-CM | POA: Diagnosis present

## 2022-08-01 DIAGNOSIS — R609 Edema, unspecified: Secondary | ICD-10-CM | POA: Diagnosis not present

## 2022-08-01 DIAGNOSIS — N189 Chronic kidney disease, unspecified: Secondary | ICD-10-CM

## 2022-08-01 DIAGNOSIS — Z888 Allergy status to other drugs, medicaments and biological substances status: Secondary | ICD-10-CM

## 2022-08-01 DIAGNOSIS — D62 Acute posthemorrhagic anemia: Secondary | ICD-10-CM

## 2022-08-01 HISTORY — DX: Acute posthemorrhagic anemia: D62

## 2022-08-01 LAB — CBC WITH DIFFERENTIAL/PLATELET
Abs Immature Granulocytes: 0.02 10*3/uL (ref 0.00–0.07)
Basophils Absolute: 0.1 10*3/uL (ref 0.0–0.1)
Basophils Relative: 1 %
Eosinophils Absolute: 0.3 10*3/uL (ref 0.0–0.5)
Eosinophils Relative: 5 %
HCT: 18.9 % — ABNORMAL LOW (ref 36.0–46.0)
Hemoglobin: 5.6 g/dL — CL (ref 12.0–15.0)
Immature Granulocytes: 0 %
Lymphocytes Relative: 20 %
Lymphs Abs: 1.1 10*3/uL (ref 0.7–4.0)
MCH: 30.9 pg (ref 26.0–34.0)
MCHC: 29.6 g/dL — ABNORMAL LOW (ref 30.0–36.0)
MCV: 104.4 fL — ABNORMAL HIGH (ref 80.0–100.0)
Monocytes Absolute: 0.4 10*3/uL (ref 0.1–1.0)
Monocytes Relative: 7 %
Neutro Abs: 3.7 10*3/uL (ref 1.7–7.7)
Neutrophils Relative %: 67 %
Platelets: 395 10*3/uL (ref 150–400)
RBC: 1.81 MIL/uL — ABNORMAL LOW (ref 3.87–5.11)
RDW: 16.8 % — ABNORMAL HIGH (ref 11.5–15.5)
WBC: 5.7 10*3/uL (ref 4.0–10.5)
nRBC: 0 % (ref 0.0–0.2)

## 2022-08-01 LAB — BASIC METABOLIC PANEL
Anion gap: 16 — ABNORMAL HIGH (ref 5–15)
BUN: 37 mg/dL — ABNORMAL HIGH (ref 8–23)
CO2: 16 mmol/L — ABNORMAL LOW (ref 22–32)
Calcium: 8.2 mg/dL — ABNORMAL LOW (ref 8.9–10.3)
Chloride: 103 mmol/L (ref 98–111)
Creatinine, Ser: 4.71 mg/dL — ABNORMAL HIGH (ref 0.44–1.00)
GFR, Estimated: 10 mL/min — ABNORMAL LOW (ref >60)
Glucose, Bld: 102 mg/dL — ABNORMAL HIGH (ref 70–99)
Potassium: 3.8 mmol/L (ref 3.5–5.1)
Sodium: 135 mmol/L (ref 135–145)

## 2022-08-01 LAB — HEPATIC FUNCTION PANEL
ALT: 12 U/L (ref 0–44)
AST: 19 U/L (ref 15–41)
Albumin: 2.6 g/dL — ABNORMAL LOW (ref 3.5–5.0)
Alkaline Phosphatase: 65 U/L (ref 38–126)
Bilirubin, Direct: <0.1 mg/dL (ref 0.0–0.2)
Total Bilirubin: 0.2 mg/dL — ABNORMAL LOW (ref 0.3–1.2)
Total Protein: 7.2 g/dL (ref 6.5–8.1)

## 2022-08-01 LAB — LAB REPORT - SCANNED
Creatinine, POC: 52.9 mg/dL
EGFR: 11

## 2022-08-01 LAB — PREPARE RBC (CROSSMATCH)

## 2022-08-01 LAB — POC OCCULT BLOOD, ED: Fecal Occult Bld: NEGATIVE

## 2022-08-01 MED ORDER — ACETAMINOPHEN 325 MG PO TABS
650.0000 mg | ORAL_TABLET | Freq: Four times a day (QID) | ORAL | Status: DC | PRN
  Administered 2022-08-02 – 2022-08-04 (×2): 650 mg via ORAL
  Filled 2022-08-01 (×2): qty 2

## 2022-08-01 MED ORDER — PANTOPRAZOLE SODIUM 40 MG PO TBEC
40.0000 mg | DELAYED_RELEASE_TABLET | Freq: Every day | ORAL | Status: DC
  Administered 2022-08-01 – 2022-08-05 (×5): 40 mg via ORAL
  Filled 2022-08-01 (×5): qty 1

## 2022-08-01 MED ORDER — PROCHLORPERAZINE EDISYLATE 10 MG/2ML IJ SOLN: 5.0000 mg | Freq: Four times a day (QID) | INTRAMUSCULAR | Status: DC | PRN

## 2022-08-01 MED ORDER — IPRATROPIUM-ALBUTEROL 0.5-2.5 (3) MG/3ML IN SOLN: 3.0000 mL | Freq: Four times a day (QID) | RESPIRATORY_TRACT | Status: DC | PRN

## 2022-08-01 MED ORDER — OXYCODONE HCL 5 MG PO TABS
5.0000 mg | ORAL_TABLET | Freq: Four times a day (QID) | ORAL | Status: AC | PRN
  Administered 2022-08-02 (×2): 5 mg via ORAL
  Filled 2022-08-01 (×2): qty 1

## 2022-08-01 MED ORDER — HYDROMORPHONE HCL 1 MG/ML IJ SOLN
0.5000 mg | INTRAMUSCULAR | Status: AC | PRN
  Administered 2022-08-03 (×2): 0.5 mg via INTRAVENOUS
  Filled 2022-08-01 (×2): qty 1

## 2022-08-01 MED ORDER — ALBUTEROL SULFATE (2.5 MG/3ML) 0.083% IN NEBU
3.0000 mL | INHALATION_SOLUTION | Freq: Four times a day (QID) | RESPIRATORY_TRACT | Status: DC | PRN
  Administered 2022-08-04 – 2022-08-05 (×2): 3 mL via RESPIRATORY_TRACT
  Filled 2022-08-01 (×2): qty 3

## 2022-08-01 MED ORDER — MORPHINE SULFATE (PF) 4 MG/ML IV SOLN
4.0000 mg | Freq: Once | INTRAVENOUS | Status: AC
  Administered 2022-08-01: 4 mg via INTRAVENOUS
  Filled 2022-08-01: qty 1

## 2022-08-01 MED ORDER — UMECLIDINIUM BROMIDE 62.5 MCG/ACT IN AEPB
1.0000 | INHALATION_SPRAY | Freq: Every day | RESPIRATORY_TRACT | Status: DC
  Administered 2022-08-02 – 2022-08-05 (×4): 1 via RESPIRATORY_TRACT
  Filled 2022-08-01 (×2): qty 7

## 2022-08-01 MED ORDER — BUSPIRONE HCL 5 MG PO TABS
10.0000 mg | ORAL_TABLET | Freq: Two times a day (BID) | ORAL | Status: DC
  Administered 2022-08-01 – 2022-08-05 (×8): 10 mg via ORAL
  Filled 2022-08-01 (×8): qty 2

## 2022-08-01 MED ORDER — POLYETHYLENE GLYCOL 3350 17 G PO PACK
17.0000 g | PACK | Freq: Every day | ORAL | Status: DC | PRN
  Administered 2022-08-04 – 2022-08-05 (×2): 17 g via ORAL
  Filled 2022-08-01 (×2): qty 1

## 2022-08-01 MED ORDER — SODIUM BICARBONATE 650 MG PO TABS
1300.0000 mg | ORAL_TABLET | Freq: Four times a day (QID) | ORAL | Status: AC
  Administered 2022-08-01 – 2022-08-04 (×12): 1300 mg via ORAL
  Filled 2022-08-01 (×12): qty 2

## 2022-08-01 MED ORDER — NICOTINE 7 MG/24HR TD PT24
7.0000 mg | MEDICATED_PATCH | Freq: Once | TRANSDERMAL | Status: AC
  Administered 2022-08-01: 7 mg via TRANSDERMAL
  Filled 2022-08-01: qty 1

## 2022-08-01 MED ORDER — SODIUM CHLORIDE 0.9% IV SOLUTION: Freq: Once | INTRAVENOUS | Status: AC

## 2022-08-01 MED ORDER — MELATONIN 3 MG PO TABS
3.0000 mg | ORAL_TABLET | Freq: Every evening | ORAL | Status: DC | PRN
  Administered 2022-08-03 – 2022-08-04 (×2): 3 mg via ORAL
  Filled 2022-08-01 (×2): qty 1

## 2022-08-01 MED ORDER — ADULT MULTIVITAMIN W/MINERALS CH
1.0000 | ORAL_TABLET | Freq: Every day | ORAL | Status: DC
  Administered 2022-08-01 – 2022-08-05 (×5): 1 via ORAL
  Filled 2022-08-01 (×5): qty 1

## 2022-08-01 MED ORDER — FLUTICASONE FUROATE-VILANTEROL 100-25 MCG/ACT IN AEPB
1.0000 | INHALATION_SPRAY | Freq: Every day | RESPIRATORY_TRACT | Status: DC
  Administered 2022-08-02 – 2022-08-05 (×4): 1 via RESPIRATORY_TRACT
  Filled 2022-08-01 (×2): qty 28

## 2022-08-01 NOTE — ED Triage Notes (Signed)
Pt states that she was seen by her kidney MD yesterday and was called today and told to come to ED due to Hgb being low.  Pt does not know what the value is.  Pt is ambulatory to triage.

## 2022-08-01 NOTE — ED Provider Notes (Signed)
[place] EMERGENCY DEPARTMENT AT [facility] Provider Note   CSN: [id number] Arrival date & time: 08/01/22  1242     History  Chief Complaint  Patient presents with   Abnormal Lab    [name] is a 62 y.o. female.  With history of CKD, anemia of chronic disease, hypertension, anxiety, depression, COPD who presents to the ED for evaluation of anemia.  She states she presented to her kidney doctor yesterday and had lab work done.  She was called today and encouraged to come to the ED for evaluation of her anemia is significantly worse than usual.  She has needed transfusions multiple times in the past.  She reports worsening of her baseline shortness of breath over the past week as well as significant fatigue with exertion.  She denies abdominal pain, chest pain, cough, melena, hematochezia, hematemesis.  She has not had a menstrual cycle recently.  She states that she is scheduled to begin dialysis in the future.   Abnormal Lab      Home Medications Prior to Admission medications   Medication Sig Start Date End Date Taking? Authorizing Provider  albuterol (VENTOLIN HFA) 108 (90 Base) MCG/ACT inhaler INHALE 2 PUFFS INTO THE LUNGS EVERY 6 HOURS AS NEEDED FOR WHEEZING OR SHORTNESS OF BREATH. 01/21/22   Claiborne Rigg, NP  amLODipine (NORVASC) 5 MG tablet Take 1 tablet (5 mg total) by mouth daily. 01/21/22   Claiborne Rigg, NP  busPIRone (BUSPAR) 10 MG tablet Take 1 tablet (10 mg total) by mouth 2 (two) times daily. 01/21/22   Claiborne Rigg, NP  Fluticasone-Umeclidin-Vilant (TRELEGY ELLIPTA) 100-62.5-25 MCG/ACT AEPB Inhale 1 puff into the lungs daily. 01/21/22   Claiborne Rigg, NP  furosemide (LASIX) 40 MG tablet Take 1 tablet (40 mg total) by mouth daily as needed for edema or fluid (as needed for leg swelling). 06/06/22   Anders Simmonds, PA-C  gabapentin (NEURONTIN) 100 MG capsule Take 1 capsule (100 mg total) by mouth 3 (three) times daily. 01/21/22   Claiborne Rigg, NP  ipratropium-albuterol (DUONEB) 0.5-2.5 (3) MG/3ML SOLN Take 3 mLs by nebulization every 6 (six) hours as needed (shortness of breath and wheezing). 01/21/22   Claiborne Rigg, NP  Multiple Vitamin (MULTIVITAMIN WITH MINERALS) TABS tablet Take 1 tablet by mouth daily. 06/29/20   [name], MD  mupirocin ointment (BACTROBAN) 2 % Apply 1 Application topically 2 (two) times daily. 06/06/22   Anders Simmonds, PA-C  pantoprazole (PROTONIX) 40 MG tablet Take 1 tablet (40 mg total) by mouth daily. 06/06/22   Anders Simmonds, PA-C  sodium bicarbonate 650 MG tablet Take 2 tablets (1,300 mg total) by mouth 3 (three) times daily. 01/25/22     traZODone (DESYREL) 50 MG tablet Take 1 tablet (50 mg total) by mouth at bedtime as needed for sleep. 01/21/22   Claiborne Rigg, NP      Allergies    Zestril [lisinopril]    Review of Systems   Review of Systems  Constitutional:  Positive for fatigue.  Respiratory:  Positive for shortness of breath.   All other systems reviewed and are negative.   Physical Exam Updated Vital Signs BP (!) 140/91   Pulse 81   Temp 98.6 F (37 C) (Oral)   Resp 12   SpO2 96%  Physical Exam Vitals and nursing note reviewed.  Constitutional:      General: She is not in acute distress.  Appearance: She is well-developed.     Comments: Chronically ill-appearing  HENT:     Head: Normocephalic and atraumatic.  Eyes:     Conjunctiva/sclera: Conjunctivae normal.     Comments: Conjunctival pallor  Cardiovascular:     Rate and Rhythm: Normal rate and regular rhythm.  Pulmonary:     Effort: Pulmonary effort is normal. No respiratory distress.     Breath sounds: No wheezing.  Abdominal:     Palpations: Abdomen is soft.     Tenderness: There is no abdominal tenderness. There is no guarding.  Musculoskeletal:        General: No swelling.     Cervical back: Neck supple.     Right lower leg: No edema.     Left lower leg: No edema.  Skin:    General:  Skin is warm and dry.     Capillary Refill: Capillary refill takes less than 2 seconds.     Coloration: Skin is pale.  Neurological:     General: No focal deficit present.     Mental Status: She is alert and oriented to person, place, and time.  Psychiatric:        Mood and Affect: Mood normal.        Behavior: Behavior normal.     ED Results / Procedures / Treatments   Labs (all labs ordered are listed, but only abnormal results are displayed) Labs Reviewed  BASIC METABOLIC PANEL - Abnormal; Notable for the following components:      Result Value   CO2 16 (*)    Glucose, Bld 102 (*)    BUN 37 (*)    Creatinine, Ser 4.71 (*)    Calcium 8.2 (*)    GFR, Estimated 10 (*)    Anion gap 16 (*)    All other components within normal limits  CBC WITH DIFFERENTIAL/PLATELET - Abnormal; Notable for the following components:   RBC 1.81 (*)    Hemoglobin 5.6 (*)    HCT 18.9 (*)    MCV 104.4 (*)    MCHC 29.6 (*)    RDW 16.8 (*)    All other components within normal limits  HEPATIC FUNCTION PANEL - Abnormal; Notable for the following components:   Albumin 2.6 (*)    Total Bilirubin 0.2 (*)    All other components within normal limits  POC OCCULT BLOOD, ED  TYPE AND SCREEN  PREPARE RBC (CROSSMATCH)    EKG None  Radiology DG Chest 1 View  Result Date: 08/01/2022 CLINICAL DATA:  Shortness of breath. EXAM: CHEST  1 VIEW COMPARISON:  Chest radiographs 11/15/2021 and 08/19/2019 FINDINGS: Cardiac silhouette is again mildly enlarged. Mediastinal contours are within normal limits. The patient is mildly rightward rotated. Mild chronic interstitial thickening is mildly decreased from 11/15/2021 and more similar to 08/19/2019. Mild horizontal linear left basilar subsegmental atelectasis. No pleural effusion or pneumothorax. No acute skeletal abnormality. IMPRESSION: 1. Mild chronic interstitial thickening, mildly decreased from 11/15/2021 and more similar to 08/19/2019. This likely represents  chronic interstitial scarring without interstitial pulmonary edema. 2. Mild cardiomegaly. Electronically Signed   By: Neita Garnet M.D.   On: 08/01/2022 13:41    Procedures Procedures    Medications Ordered in ED Medications  nicotine (NICODERM CQ - dosed in mg/24 hr) patch 7 mg (7 mg Transdermal Patch Applied 08/01/22 1953)  acetaminophen (TYLENOL) tablet 650 mg (has no administration in time range)  polyethylene glycol (MIRALAX / GLYCOLAX) packet 17 g (has no administration in time  range)  melatonin tablet 3 mg (has no administration in time range)  prochlorperazine (COMPAZINE) injection 5 mg (has no administration in time range)  0.9 %  sodium chloride infusion (Manually program via Guardrails IV Fluids) ( Intravenous New Bag/Given 08/01/22 1908)  morphine (PF) 4 MG/ML injection 4 mg (4 mg Intravenous Given 08/01/22 1909)    ED Course/ Medical Decision Making/ A&P Clinical Course as of 08/01/22 2009  Thu Aug 01, 2022  1948 Spoke with hospitalist Dr. Margo Aye who will admit [AS]    Clinical Course User Index [AS] Darla Mcdonald, Edsel Petrin, PA-C                             Medical Decision Making Amount and/or Complexity of Data Reviewed Labs: ordered.  Risk Prescription drug management.  This patient presents to the ED for concern of anemia, this involves an extensive number of treatment options, and is a complaint that carries with it a high risk of complications and morbidity.  The differential diagnosis includes anemia of chronic disease, acute blood loss anemia  Co morbidities that complicate the patient evaluation  CKD, anemia of chronic disease, hypertension, anxiety, depression, COPD  My initial workup includes  Additional history obtained from: Nursing notes from this visit.  I ordered, reviewed and interpreted labs which include: CBC, CMP, type and screen, Hemoccult.  Bicarb of 16 which is apparently near patient's baseline.  Kidney function tests are near baseline.  Anion  gap of 16.  Hemoglobin of 5.6  I ordered imaging studies including chest x-ray I independently visualized and interpreted imaging which showed no acute findings I agree with the radiologist interpretation  Cardiac Monitoring:  The patient was maintained on a cardiac monitor.  I personally viewed and interpreted the cardiac monitored which showed an underlying rhythm of: NSR  Consultations Obtained:  I requested consultation with the hospitalist Dr. Margo Aye,  and discussed lab and imaging findings as well as pertinent plan - they recommend: Admission  Afebrile, hemodynamically stable.  62 year old female presenting to the ED for evaluation of anemia.  She has stage V CKD and is supposed to begin dialysis in the near future.  Her anemia has progressively gotten worse.  Hemoglobin yesterday was 6 and is 5.7 today.  Patient reports worsening fatigue and shortness of breath from baseline.  She also appears very pale on exam.  Hemoccult negative.  She denies other sources of blood loss.  This is likely worsening of anemia from chronic disease.  Patient was transfused with 2 units of blood in the emergency department.  Previous visits in the emergency department with transfusion of blood revealed that her metabolic panel improved after transfusion and is likely a side effect of her CKD.  Admitted to hospitalist for further monitoring.  Stable at the time of admission.  Patient's case discussed with Dr. Dalene Seltzer who agrees with plan to admit.   Note: Portions of this report may have been transcribed using voice recognition software. Every effort was made to ensure accuracy; however, inadvertent computerized transcription errors may still be present.        Final Clinical Impression(s) / ED Diagnoses Final diagnoses:  Symptomatic anemia  Chronic kidney disease, unspecified CKD stage    Rx / DC Orders ED Discharge Orders     None         Mora Bellman 08/01/22 2009     Alvira Monday, MD 08/02/22 1355

## 2022-08-01 NOTE — ED Provider Triage Note (Signed)
Emergency Medicine Provider Triage Evaluation Note  [name] , a 62 y.o. female  was evaluated in triage.  Pt complains of low hemoglobin.  Patient states that she had routine labs performed by her [place] kidney outpatient yesterday which showed low hemoglobin.  Patient reports having had transfusions in the past due to low hemoglobin.  Patient states 5 CKD in the process of being set up for hemodialysis but not currently on hemodialysis.  Reports some worsening shortness of breath with exertion that is some what relieved by her at home inhalers but states it is "worse than normal."  Denies fever, cough, chest pain, abdominal pain, nausea, vomiting, urinary symptoms, change in bowel habits.  Review of Systems  Positive: See above Negative:   Physical Exam  BP (!) 154/60 (BP Location: Right Arm)   Pulse 100   Temp 98.3 F (36.8 C) (Oral)   Resp (!) 22   SpO2 92%  Gen:   Awake, no distress   Resp:  Normal effort  MSK:   Moves extremities without difficulty  Other:    Medical Decision Making  Medically screening exam initiated at 1:12 PM.  Appropriate orders placed.  [name] was informed that the remainder of the evaluation will be completed by another provider, this initial triage assessment does not replace that evaluation, and the importance of remaining in the ED until their evaluation is complete.     Peter Garter, [place] 08/01/22 1314

## 2022-08-01 NOTE — ED Notes (Signed)
ED TO INPATIENT HANDOFF REPORT  ED Nurse Name and Phone #: [phone]  S Name/Age/Gender Rudene Christians Berdan 62 y.o. female Room/Bed: 028C/028C  Code Status   Code Status: Full Code  Home/SNF/Other Home Patient oriented to: self, place, time, and situation Is this baseline? Yes   Triage Complete: Triage complete  Chief Complaint Symptomatic anemia [D64.9]  Triage Note Pt states that she was seen by her kidney MD yesterday and was called today and told to come to ED due to Hgb being low.  Pt does not know what the value is.  Pt is ambulatory to triage.    Allergies Allergies  Allergen Reactions   Zestril [Lisinopril] Other (See Comments)    Hyperkalemia    Level of Care/Admitting Diagnosis ED Disposition     ED Disposition  Admit   Condition  --   Comment  Hospital Area: [facility] [[id number]]  Level of Care: Telemetry Medical [104]  May admit patient to [facility] or [facility] if equivalent level of care is available:: No  Covid Evaluation: Asymptomatic - no recent exposure (last 10 days) testing not required  Diagnosis: Symptomatic anemia [3086578]  Admitting Physician: Darlin Drop [[id number]]  Attending Physician: Darlin Drop [[id number]]  Certification:: I certify this patient will need inpatient services for at least 2 midnights  Estimated Length of Stay: 2          B Medical/Surgery History Past Medical History:  Diagnosis Date   Alcohol abuse    Allergy    Anxiety    Arthritis    Asthma    Cannabis abuse    Cocaine abuse (HCC)    COPD (chronic obstructive pulmonary disease) (HCC)    Depression    Drug addiction (HCC)    GERD (gastroesophageal reflux disease)    Heart murmur    Homelessness    Hyperlipidemia    Hypertension    pt stated "every once in a while BP will be high but has not been prescribed medication for HTN.    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) (HCC) 02/22/2016   Past Surgical History:  Procedure Laterality Date   BIOPSY  01/01/2019   Procedure: BIOPSY;  Surgeon: Beverley Fiedler, MD;  Location: [facility] ENDOSCOPY;  Service: Endoscopy;;   BIOPSY  11/17/2021   Procedure: BIOPSY;  Surgeon: Lynann Bologna, MD;  Location: [facility] ENDOSCOPY;  Service: Gastroenterology;;   CESAREAN SECTION  1989   COLONOSCOPY  11/07/2020   2018   CYSTOSCOPY W/ URETERAL STENT PLACEMENT Left 08/01/2018   Procedure: CYSTOSCOPY WITH RETROGRADE PYELOGRAM/URETERAL STENT PLACEMENT;  Surgeon: Crista Elliot, MD;  Location: [facility] ORS;  Service: Urology;  Laterality: Left;   CYSTOSCOPY WITH RETROGRADE PYELOGRAM, URETEROSCOPY AND STENT PLACEMENT Left 01/15/2019   Procedure: CYSTOSCOPY WITH RETROGRADE PYELOGRAM, URETEROSCOPY AND STENT PLACEMENT;  Surgeon: Crista Elliot, MD;  Location: [facility] ORS;  Service: Urology;  Laterality: Left;   ESOPHAGOGASTRODUODENOSCOPY (EGD) WITH PROPOFOL N/A 01/01/2019   Procedure: ESOPHAGOGASTRODUODENOSCOPY (EGD) WITH PROPOFOL;  Surgeon: Beverley Fiedler, MD;  Location: [facility] ENDOSCOPY;  Service: Endoscopy;  Laterality: N/A;   ESOPHAGOGASTRODUODENOSCOPY (EGD) WITH PROPOFOL N/A 08/21/2019   Procedure: ESOPHAGOGASTRODUODENOSCOPY (EGD) WITH PROPOFOL;  Surgeon: Napoleon Form, MD;  Location: [facility] ENDOSCOPY;  Service: Endoscopy;  Laterality: N/A;   ESOPHAGOGASTRODUODENOSCOPY (EGD) WITH PROPOFOL N/A 11/17/2021  Procedure: ESOPHAGOGASTRODUODENOSCOPY (EGD) WITH PROPOFOL;  Surgeon: Lynann Bologna, MD;  Location: [facility] ENDOSCOPY;  Service: Gastroenterology;  Laterality: N/A;   FRACTURE SURGERY Left 2011   arm   UPPER GASTROINTESTINAL ENDOSCOPY       A IV Location/Drains/Wounds Patient Lines/Drains/Airways Status     Active Line/Drains/Airways     Name Placement date Placement time Site Days   Peripheral IV 08/01/22 20 G Left Antecubital 08/01/22  1258  Antecubital  less than 1   Peripheral IV  08/01/22 22 G Anterior;Right Forearm 08/01/22  1905  Forearm  less than 1            Intake/Output Last 24 hours No intake or output data in the 24 hours ending 08/01/22 2009  Labs/Imaging Results for orders placed or performed during the hospital encounter of 08/01/22 (from the past 48 hour(s))  Basic metabolic panel     Status: Abnormal   Collection Time: 08/01/22  1:08 PM  Result Value Ref Range   Sodium 135 135 - 145 mmol/L   Potassium 3.8 3.5 - 5.1 mmol/L   Chloride 103 98 - 111 mmol/L   CO2 16 (L) 22 - 32 mmol/L   Glucose, Bld 102 (H) 70 - 99 mg/dL    Comment: Glucose reference range applies only to samples taken after fasting for at least 8 hours.   BUN 37 (H) 8 - 23 mg/dL   Creatinine, Ser 7.82 (H) 0.44 - 1.00 mg/dL   Calcium 8.2 (L) 8.9 - 10.3 mg/dL   GFR, Estimated 10 (L) >60 mL/min    Comment: (NOTE) Calculated using the CKD-EPI Creatinine Equation (2021)    Anion gap 16 (H) 5 - 15    Comment: Performed at [facility], [address], [place], [place] [address]  CBC with Differential     Status: Abnormal   Collection Time: 08/01/22  1:08 PM  Result Value Ref Range   WBC 5.7 4.0 - 10.5 K/uL   RBC 1.81 (L) 3.87 - 5.11 MIL/uL   Hemoglobin 5.6 (LL) 12.0 - 15.0 g/dL    Comment: REPEATED TO VERIFY THIS CRITICAL RESULT HAS VERIFIED AND BEEN CALLED TO ELLEN FLUEKCKIGER,RN BY ZELDA BEECH ON 05 16 2024 AT 1404, AND HAS BEEN READ BACK.     HCT 18.9 (L) 36.0 - 46.0 %   MCV 104.4 (H) 80.0 - 100.0 fL   MCH 30.9 26.0 - 34.0 pg   MCHC 29.6 (L) 30.0 - 36.0 g/dL   RDW 30.8 (H) 65.7 - 84.6 %   Platelets 395 150 - 400 K/uL   nRBC 0.0 0.0 - 0.2 %   Neutrophils Relative % 67 %   Neutro Abs 3.7 1.7 - 7.7 K/uL   Lymphocytes Relative 20 %   Lymphs Abs 1.1 0.7 - 4.0 K/uL   Monocytes Relative 7 %   Monocytes Absolute 0.4 0.1 - 1.0 K/uL   Eosinophils Relative 5 %   Eosinophils Absolute 0.3 0.0 - 0.5 K/uL   Basophils Relative 1 %   Basophils Absolute 0.1 0.0 - 0.1 K/uL    Immature Granulocytes 0 %   Abs Immature Granulocytes 0.02 0.00 - 0.07 K/uL    Comment: Performed at [facility], [address], [place], [place] [address]  Type and screen [facility]     Status: None (Preliminary result)   Collection Time: 08/01/22  1:18 PM  Result Value Ref Range   ABO/RH(D) A POS    Antibody Screen NEG  Sample Expiration 08/04/2022,2359    Unit Number [id number]    Blood Component Type RBC LR PHER2    Unit division 00    Status of Unit ISSUED    Transfusion Status OK TO TRANSFUSE    Crossmatch Result Compatible    Unit Number [id number]    Blood Component Type RED CELLS,LR    Unit division 00    Status of Unit ISSUED    Transfusion Status OK TO TRANSFUSE    Crossmatch Result      Compatible Performed at [facility] Lab, [address], [place], [place] [address]   Prepare RBC (crossmatch)     Status: None   Collection Time: 08/01/22  6:47 PM  Result Value Ref Range   Order Confirmation      ORDER PROCESSED BY BLOOD BANK Performed at [facility], [address] [place], [place] [address]   POC occult blood, ED Provider will collect     Status: None   Collection Time: 08/01/22  6:56 PM  Result Value Ref Range   Fecal Occult Bld NEGATIVE NEGATIVE  Hepatic function panel     Status: Abnormal   Collection Time: 08/01/22  7:05 PM  Result Value Ref Range   Total Protein 7.2 6.5 - 8.1 g/dL   Albumin 2.6 (L) 3.5 - 5.0 g/dL   AST 19 15 - 41 U/L   ALT 12 0 - 44 U/L   Alkaline Phosphatase 65 38 - 126 U/L   Total Bilirubin 0.2 (L) 0.3 - 1.2 mg/dL   Bilirubin, Direct <2.4 0.0 - 0.2 mg/dL   Indirect Bilirubin NOT CALCULATED 0.3 - 0.9 mg/dL    Comment: Performed at [facility], [address] [place], [place] [address]   DG Chest 1 View  Result Date: 08/01/2022 CLINICAL DATA:  Shortness of breath. EXAM: CHEST  1 VIEW COMPARISON:  Chest radiographs 11/15/2021 and 08/19/2019 FINDINGS: Cardiac silhouette is again  mildly enlarged. Mediastinal contours are within normal limits. The patient is mildly rightward rotated. Mild chronic interstitial thickening is mildly decreased from 11/15/2021 and more similar to 08/19/2019. Mild horizontal linear left basilar subsegmental atelectasis. No pleural effusion or pneumothorax. No acute skeletal abnormality. IMPRESSION: 1. Mild chronic interstitial thickening, mildly decreased from 11/15/2021 and more similar to 08/19/2019. This likely represents chronic interstitial scarring without interstitial pulmonary edema. 2. Mild cardiomegaly. Electronically Signed   By: Neita Garnet M.D.   On: 08/01/2022 13:41    Pending Labs Unresulted Labs (From admission, onward)    None       Vitals/Pain Today's Vitals   08/01/22 1618 08/01/22 1830 08/01/22 1945 08/01/22 1949  BP: (!) 149/76 (!) 146/81 (!) 140/91 (!) 140/91  Pulse: 80 74 81 81  Resp: 16 18 14 12   Temp: 98.3 F (36.8 C)  98.6 F (37 C) 98.6 F (37 C)  TempSrc:   Oral Oral  SpO2: 100% 96%  96%  PainSc:        Isolation Precautions No active isolations  Medications Medications  nicotine (NICODERM CQ - dosed in mg/24 hr) patch 7 mg (7 mg Transdermal Patch Applied 08/01/22 1953)  acetaminophen (TYLENOL) tablet 650 mg (has no administration in time range)  polyethylene glycol (MIRALAX / GLYCOLAX) packet 17 g (has no administration in time range)  melatonin tablet 3 mg (has no administration in time range)  prochlorperazine (COMPAZINE) injection 5 mg (has no administration in time range)  0.9 %  sodium chloride infusion (Manually program via  Guardrails IV Fluids) ( Intravenous New Bag/Given 08/01/22 1908)  morphine (PF) 4 MG/ML injection 4 mg (4 mg Intravenous Given 08/01/22 1909)    Mobility walks with device     Focused Assessments Renal Assessment Handoff:  Hemodialysis Schedule:  Last Hemodialysis date and time:    Restricted appendage:    R Recommendations: See Admitting Provider  Note  Report given to:   Additional Notes:

## 2022-08-01 NOTE — H&P (Signed)
History and Physical  [name]:[id number] DOB: 09-27-1960 DOA: 08/01/2022  Referring physician: Lula Olszewski PA-EDP  PCP: [name], MD  Outpatient Specialists: Nephrology Patient coming from: Home  Chief Complaint: Generalized fatigue.  HPI: [name] is a 62 y.o. female with medical history significant for advanced CKD stage IV followed by nephrology, anemia of chronic disease, essential hypertension, GERD, peripheral neuropathy, chronic anxiety/depression, who presented to [facility] ED at the recommendation of her nephrologist due to low hemoglobin level, associated with generalized weakness and fatigue.  The patient saw her nephrologist yesterday blood was drawn, resulted today with a low hemoglobin, was called and asked to come to the ED for further evaluation and management of her anemia.  Denies any abdominal pain, melena or hematochezia.  States she has been sedentary due to her generalized weakness.     In the ED, noted to have a hemoglobin of 5.6K.  FOBT was negative.  2 units PRBCs were ordered to be transfused in the ED.  TRH, hospitalist service was asked to admit.  ED Course: Temperature 98.6.  BP 140/91, pulse 81, respiration rate 20, O2 saturation 96% on room air.  Lab studies remarkable for serum bicarb 16, BUN 37, creatinine 4.71, anion gap 16, GFR 10.  Albumin 2.6.  Hemoglobin 5.6, MCV 104, WBC 5.7, platelet 395.  Review of Systems: Review of systems as noted in the HPI. All other systems reviewed and are negative.   Past Medical History:  Diagnosis Date   Alcohol abuse    Allergy    Anxiety    Arthritis    Asthma    Cannabis abuse    Cocaine abuse (HCC)    COPD (chronic obstructive pulmonary disease) (HCC)    Depression    Drug addiction (HCC)    GERD (gastroesophageal reflux disease)    Heart murmur    Homelessness    Hyperlipidemia    Hypertension    pt stated "every once in a while BP will be high but has not been prescribed medication for HTN.     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) 02/22/2016   Past Surgical History:  Procedure Laterality Date   BIOPSY  01/01/2019   Procedure: BIOPSY;  Surgeon: Beverley Fiedler, MD;  Location: [facility] ENDOSCOPY;  Service: Endoscopy;;   BIOPSY  11/17/2021   Procedure: BIOPSY;  Surgeon: Lynann Bologna, MD;  Location: [facility] ENDOSCOPY;  Service: Gastroenterology;;   CESAREAN SECTION  1989   COLONOSCOPY  11/07/2020   2018   CYSTOSCOPY W/ URETERAL STENT PLACEMENT Left 08/01/2018   Procedure: CYSTOSCOPY WITH RETROGRADE PYELOGRAM/URETERAL STENT PLACEMENT;  Surgeon: Crista Elliot, MD;  Location: [facility];  Service: Urology;  Laterality: Left;   CYSTOSCOPY WITH RETROGRADE PYELOGRAM, URETEROSCOPY AND STENT PLACEMENT Left 01/15/2019   Procedure: CYSTOSCOPY WITH RETROGRADE PYELOGRAM, URETEROSCOPY AND STENT PLACEMENT;  Surgeon: Crista Elliot, MD;  Location: [facility];  Service: Urology;  Laterality: Left;   ESOPHAGOGASTRODUODENOSCOPY (EGD) WITH PROPOFOL N/A 01/01/2019   Procedure: ESOPHAGOGASTRODUODENOSCOPY (EGD) WITH PROPOFOL;  Surgeon: Beverley Fiedler, MD;  Location: [facility] ENDOSCOPY;  Service: Endoscopy;  Laterality: N/A;   ESOPHAGOGASTRODUODENOSCOPY (EGD) WITH PROPOFOL N/A 08/21/2019   Procedure: ESOPHAGOGASTRODUODENOSCOPY (EGD) WITH PROPOFOL;  Surgeon: Napoleon Form, MD;  Location: [facility] ENDOSCOPY;  Service: Endoscopy;  Laterality: N/A;   ESOPHAGOGASTRODUODENOSCOPY (EGD) WITH PROPOFOL N/A  11/17/2021   Procedure: ESOPHAGOGASTRODUODENOSCOPY (EGD) WITH PROPOFOL;  Surgeon: Lynann Bologna, MD;  Location: [facility] ENDOSCOPY;  Service: Gastroenterology;  Laterality: N/A;   FRACTURE SURGERY Left 2011   arm   UPPER GASTROINTESTINAL ENDOSCOPY      Social History:  reports that she has been smoking cigarettes. She has been smoking an average of 1.00 packs per day. She has been exposed to tobacco smoke.  She has never used smokeless tobacco. She reports that she does not currently use alcohol. She reports current drug use. Drug: Marijuana.   Allergies  Allergen Reactions   Zestril [Lisinopril] Other (See Comments)    Hyperkalemia    Family History  Problem Relation Age of Onset   Diabetes Father    Colon cancer Neg Hx    Esophageal cancer Neg Hx    Rectal cancer Neg Hx    Stomach cancer Neg Hx       Prior to Admission medications   Medication Sig Start Date End Date Taking? Authorizing Provider  albuterol (VENTOLIN HFA) 108 (90 Base) MCG/ACT inhaler INHALE 2 PUFFS INTO THE LUNGS EVERY 6 HOURS AS NEEDED FOR WHEEZING OR SHORTNESS OF BREATH. 01/21/22   Claiborne Rigg, NP  amLODipine (NORVASC) 5 MG tablet Take 1 tablet (5 mg total) by mouth daily. 01/21/22   Claiborne Rigg, NP  busPIRone (BUSPAR) 10 MG tablet Take 1 tablet (10 mg total) by mouth 2 (two) times daily. 01/21/22   Claiborne Rigg, NP  Fluticasone-Umeclidin-Vilant (TRELEGY ELLIPTA) 100-62.5-25 MCG/ACT AEPB Inhale 1 puff into the lungs daily. 01/21/22   Claiborne Rigg, NP  furosemide (LASIX) 40 MG tablet Take 1 tablet (40 mg total) by mouth daily as needed for edema or fluid (as needed for leg swelling). 06/06/22   Anders Simmonds, PA-C  gabapentin (NEURONTIN) 100 MG capsule Take 1 capsule (100 mg total) by mouth 3 (three) times daily. 01/21/22   Claiborne Rigg, NP  ipratropium-albuterol (DUONEB) 0.5-2.5 (3) MG/3ML SOLN Take 3 mLs by nebulization every 6 (six) hours as needed (shortness of breath and wheezing). 01/21/22   Claiborne Rigg, NP  Multiple Vitamin (MULTIVITAMIN WITH MINERALS) TABS tablet Take 1 tablet by mouth daily. 06/29/20   [name], MD  mupirocin ointment (BACTROBAN) 2 % Apply 1 Application topically 2 (two) times daily. 06/06/22   Anders Simmonds, PA-C  pantoprazole (PROTONIX) 40 MG tablet Take 1 tablet (40 mg total) by mouth daily. 06/06/22   Anders Simmonds, PA-C  sodium bicarbonate 650 MG  tablet Take 2 tablets (1,300 mg total) by mouth 3 (three) times daily. 01/25/22     traZODone (DESYREL) 50 MG tablet Take 1 tablet (50 mg total) by mouth at bedtime as needed for sleep. 01/21/22   Claiborne Rigg, NP    Physical Exam: BP (!) 147/82   Pulse 79   Temp 98.2 F (36.8 C) (Oral)   Resp 10   SpO2 96%   General: 62 y.o. year-old female well developed well nourished in no acute distress.  Alert and oriented x3. Cardiovascular: Regular rate and rhythm with no rubs or gallops.  No thyromegaly or JVD noted.  2+ pitting edema in lower extremities bilaterally. Respiratory: Clear to auscultation with no wheezes or rales. Good inspiratory effort. Abdomen: Soft nontender nondistended with normal bowel sounds x4 quadrants. Muskuloskeletal: No cyanosis or clubbing noted bilaterally Neuro: CN II-XII intact, strength, sensation, reflexes Skin: No ulcerative lesions noted or rashes Psychiatry: Judgement and insight appear normal. Mood is  appropriate for condition and setting          Labs on Admission:  Basic Metabolic Panel: Recent Labs  Lab 08/01/22 1308  NA 135  K 3.8  CL 103  CO2 16*  GLUCOSE 102*  BUN 37*  CREATININE 4.71*  CALCIUM 8.2*   Liver Function Tests: Recent Labs  Lab 08/01/22 1905  AST 19  ALT 12  ALKPHOS 65  BILITOT 0.2*  PROT 7.2  ALBUMIN 2.6*   No results for input(s): "LIPASE", "AMYLASE" in the last 168 hours. No results for input(s): "AMMONIA" in the last 168 hours. CBC: Recent Labs  Lab 08/01/22 1308  WBC 5.7  NEUTROABS 3.7  HGB 5.6*  HCT 18.9*  MCV 104.4*  PLT 395   Cardiac Enzymes: No results for input(s): "CKTOTAL", "CKMB", "CKMBINDEX", "TROPONINI" in the last 168 hours.  BNP (last 3 results) Recent Labs    11/15/21 0529  BNP 356.6*    ProBNP (last 3 results) No results for input(s): "PROBNP" in the last 8760 hours.  CBG: No results for input(s): "GLUCAP" in the last 168 hours.  Radiological Exams on Admission: DG Chest  1 View  Result Date: 08/01/2022 CLINICAL DATA:  Shortness of breath. EXAM: CHEST  1 VIEW COMPARISON:  Chest radiographs 11/15/2021 and 08/19/2019 FINDINGS: Cardiac silhouette is again mildly enlarged. Mediastinal contours are within normal limits. The patient is mildly rightward rotated. Mild chronic interstitial thickening is mildly decreased from 11/15/2021 and more similar to 08/19/2019. Mild horizontal linear left basilar subsegmental atelectasis. No pleural effusion or pneumothorax. No acute skeletal abnormality. IMPRESSION: 1. Mild chronic interstitial thickening, mildly decreased from 11/15/2021 and more similar to 08/19/2019. This likely represents chronic interstitial scarring without interstitial pulmonary edema. 2. Mild cardiomegaly. Electronically Signed   By: Neita Garnet M.D.   On: 08/01/2022 13:41    EKG: I independently viewed the EKG done and my findings are as followed: Sinus rhythm rate of 79.  Nonspecific ST-T changes with QTc 476.  Assessment/Plan Present on Admission:  Symptomatic anemia  Active Problems:   Symptomatic anemia  Symptomatic anemia in the setting of anemia of chronic disease Presented with generalized weakness and fatigue. Hemoglobin 5.6K, negative FOBT.  2 units PRBC ordered to be transfused by EDP Repeat CBC posttransfusion  AKI on CKD 4 suspect prerenal secondary to dehydration. Baseline creatinine appears to be 4.0 Presented with creatinine of 4.71 Avoid nephrotoxic agents, and hypotension Monitor urine output Repeat BMP in the morning.  Anion gap metabolic acidosis in the setting of renal insufficiency Presented with serum bicarb of 16, anion gap of 16 Resume home p.o. sodium bicarbonate Repeat renal panel in the morning.  Hypoalbuminemia Serum albumin 2.6 LFTs unremarkable.  Chronic bilateral lower extremity edema Very tender on palpation Obtain bilateral Doppler ultrasound to rule out DVTs with sedentary lifestyle.  Chronic HFpEF 55 to  60% Last 2D echo done on 11/15/2021 revealed LVEF 55 to 60% with grade 1 diastolic dysfunction. Start strict I's and O's and daily weight  Chronic anxiety/depression Resume home regimen.  Generalized weakness PT OT assessment Fall precautions.     DVT prophylaxis: SCDs if bilateral Doppler ultrasound is negative for DVT.  Code Status: Full code  Family Communication: None at bedside  Disposition Plan: Admitted to telemetry medical unit  Consults called: Nephrology  Admission status: Inpatient status.   Status is: Inpatient The patient requires at least 2 midnights for further evaluation and treatment of present condition.   Darlin Drop MD [facility] Pager  [phone]  If 7PM-7AM, please contact night-coverage [email or web address] Password [facility]  08/01/2022, 8:12 PM

## 2022-08-01 NOTE — ED Notes (Addendum)
CRITICAL VALUE STICKER  CRITICAL VALUE: Hemoglogin 5.6  RECEIVER (on-site recipient of call): Alvino Chapel   DATE & TIME NOTIFIED: 2:04 PM   MESSENGER (representative from lab):  PA NOTIFIED: Excell Seltzer PA  TIME OF NOTIFICATION: 2:05 PM   RESPONSE:

## 2022-08-02 ENCOUNTER — Inpatient Hospital Stay (HOSPITAL_COMMUNITY): Payer: Medicare Other

## 2022-08-02 DIAGNOSIS — R609 Edema, unspecified: Secondary | ICD-10-CM

## 2022-08-02 LAB — TYPE AND SCREEN
ABO/RH(D): A POS
Antibody Screen: NEGATIVE
Unit division: 0
Unit division: 0

## 2022-08-02 LAB — CBC
HCT: 21.7 % — ABNORMAL LOW (ref 36.0–46.0)
Hemoglobin: 7 g/dL — ABNORMAL LOW (ref 12.0–15.0)
MCH: 30 pg (ref 26.0–34.0)
MCHC: 32.3 g/dL (ref 30.0–36.0)
MCV: 93.1 fL (ref 80.0–100.0)
Platelets: 293 10*3/uL (ref 150–400)
RBC: 2.33 MIL/uL — ABNORMAL LOW (ref 3.87–5.11)
RDW: 17.1 % — ABNORMAL HIGH (ref 11.5–15.5)
WBC: 4.4 10*3/uL (ref 4.0–10.5)
nRBC: 0 % (ref 0.0–0.2)

## 2022-08-02 LAB — BASIC METABOLIC PANEL
Anion gap: 12 (ref 5–15)
BUN: 35 mg/dL — ABNORMAL HIGH (ref 8–23)
CO2: 16 mmol/L — ABNORMAL LOW (ref 22–32)
Calcium: 7.6 mg/dL — ABNORMAL LOW (ref 8.9–10.3)
Chloride: 106 mmol/L (ref 98–111)
Creatinine, Ser: 4.18 mg/dL — ABNORMAL HIGH (ref 0.44–1.00)
GFR, Estimated: 12 mL/min — ABNORMAL LOW (ref >60)
Glucose, Bld: 90 mg/dL (ref 70–99)
Potassium: 3.4 mmol/L — ABNORMAL LOW (ref 3.5–5.1)
Sodium: 134 mmol/L — ABNORMAL LOW (ref 135–145)

## 2022-08-02 LAB — BPAM RBC
Blood Product Expiration Date: 202406142359
Blood Product Expiration Date: 202406152359
ISSUE DATE / TIME: 202405161936
ISSUE DATE / TIME: 202405162233
Unit Type and Rh: 6200
Unit Type and Rh: 6200

## 2022-08-02 LAB — MAGNESIUM: Magnesium: 1.6 mg/dL — ABNORMAL LOW (ref 1.7–2.4)

## 2022-08-02 LAB — PHOSPHORUS: Phosphorus: 6.2 mg/dL — ABNORMAL HIGH (ref 2.5–4.6)

## 2022-08-02 MED ORDER — AMLODIPINE BESYLATE 5 MG PO TABS
5.0000 mg | ORAL_TABLET | Freq: Every day | ORAL | Status: DC
  Administered 2022-08-02 – 2022-08-03 (×2): 5 mg via ORAL
  Filled 2022-08-02 (×2): qty 1

## 2022-08-02 NOTE — Progress Notes (Signed)
PROGRESS NOTE  [name]  DOB: 02-12-1961  PCP: [name], MD WUJ:[id number]  DOA: 08/01/2022  LOS: 1 day  Hospital Day: 2  Brief narrative: [name] is a 62 y.o. female with PMH significant for CKD stage IV, DM2, HTN, HLD, chronic daily smoking, COPD, GERD, peripheral neuropathy, anxiety/depression who is homeless and currently living in a motel. 5/15, patient was seen at nephrologist Dr. [name] office.  Blood work collected. 5/16, blood work showed hemoglobin low and patient was directed to ED.  Patient endorses generalized weakness, fatigue for last several days.  In the ED, hemodynamically stable, blood pressure 150s Labs showed hemoglobin low at 5.6, creatinine elevated to 4.71 FOBT negative 2 units transfusion ordered Admitted to [facility]  Subjective: Patient was seen and examined this morning.  Pleasant middle-aged Caucasian female.  Sitting up in recliner.  Not in distress.  No new symptoms. Chart reviewed Blood pressure remains elevated to 150s Repeat labs this morning with creatinine at 4.19, potassium low at 3.4, hemoglobin at 7  Assessment and plan: Acute symptomatic anemia  Anemia of chronic disease Presented with generalized weakness and fatigue.  FOBT negative Hemoglobin was low at 5.6.  2 units PRBC transfusion given with improvement to 7.   Continue to monitor Continue PPI.   Recent Labs    11/15/21 1228 11/15/21 1747 11/18/21 0517 06/06/22 1410 06/07/22 1009 08/01/22 1308 08/02/22 0450  HGB  --    < > 8.7* 6.0* 6.1* 5.6* 7.0*  MCV  --    < >  --  96 101.1* 104.4* 93.1  FERRITIN 11  --   --   --   --   --   --   TIBC 449  --   --   --   --   --   --   IRON 14*  --   --   --   --   --   --    < > = values in this interval not displayed.   AKI on CKD 4  Chronic metabolic acidosis Baseline creatinine close to 4.  Presented with creatinine elevated 4.71 and serum bicarb level low at 16.  Suspect worsening of renal function secondary to  anemia and dehydration.   Creatinine seems improving after blood transfusion. Continue sodium bicarb supplement . Continue to monitor  Recent Labs    11/15/21 0528 11/16/21 0012 11/17/21 0504 11/18/21 0517 06/06/22 1410 06/07/22 1009 06/07/22 1615 08/01/22 1308 08/02/22 0450  BUN 40* 43* 57* 61* 37* 34* 37* 37* 35*  CREATININE 4.15* 4.46* 4.43* 4.16* 4.14* 4.07* 3.97* 4.71* 4.18*  CO2 15* 14* 22 16* 13* 10* 12* 16* 16*   Chronic bilateral lower extremity edema Patient reports bilateral lower extremity edema that improves with lying down and elevating legs. Doppler US lower extremity obtained..  No evidence of DVT noted. At the time of my examination this morning, she did not have pedal edema.   Chronic HFpEF  Hypertension Last 2D echo done on 11/15/2021 revealed LVEF 55 to 60% with grade 1 diastolic dysfunction. PTA on amlodipine 5 mg daily, Lasix 40 mg daily as needed Continue amlodipine.  Keep Lasix on hold  COPD Continue bronchodilators   Chronic anxiety/depression PTA on Neurontin 100 mg 3 times daily, buspirone 10 mg twice daily,   Generalized weakness PT OT assessment Fall precautions.   Mobility: Encourage ambulation.  PT eval ordered  Goals of care   Code Status: Full Code     DVT  prophylaxis:  SCDs Start: 08/01/22 2008   Antimicrobials: None Fluid: None Consultants: None Family Communication: None at bedside  Status: Inpatient Level of care:  Telemetry Medical   Patient from: [facility] Anticipated d/c to: Hopefully back to motel Needs to continue in-hospital care:  Continue hemoglobin and creatinine trend   Diet:  Diet Order             Diet renal with fluid restriction Fluid restriction: 1200 mL Fluid; Room service appropriate? Yes; Fluid consistency: Thin  Diet effective now                   Scheduled Meds:  amLODipine  5 mg Oral Daily   busPIRone  10 mg Oral BID   fluticasone furoate-vilanterol  1 puff Inhalation Daily    And   umeclidinium bromide  1 puff Inhalation Daily   multivitamin with minerals  1 tablet Oral Daily   nicotine  7 mg Transdermal Once   pantoprazole  40 mg Oral Daily   sodium bicarbonate  1,300 mg Oral QID    PRN meds: acetaminophen, albuterol, HYDROmorphone (DILAUDID) injection, ipratropium-albuterol, melatonin, polyethylene glycol, prochlorperazine   Infusions:    Antimicrobials: Anti-infectives (From admission, onward)    None       Nutritional status:  Body mass index is 19.82 kg/m.          Objective: Vitals:   08/02/22 0149 08/02/22 0856  BP: (!) 142/76 (!) 155/77  Pulse: 82 79  Resp: 16 19  Temp: 98.8 F (37.1 C) 98 F (36.7 C)  SpO2: 91% 97%    Intake/Output Summary (Last 24 hours) at 08/02/2022 1326 Last data filed at 08/02/2022 0900 Gross per 24 hour  Intake 2845 ml  Output --  Net 2845 ml   Filed Weights   08/01/22 2253 08/02/22 0500  Weight: 56.7 kg 57.4 kg   Weight change:  Body mass index is 19.82 kg/m.   Physical Exam: General exam: Pleasant, middle-aged Caucasian female Skin: No rashes, lesions or ulcers. HEENT: Atraumatic, normocephalic, no obvious bleeding Lungs: Clear to auscultation bilaterally CVS: Regular rate and rhythm, no murmur GI/Abd soft, nontender, nondistended, bowel sound present CNS: Alert, awake, oriented x 3 Psychiatry: Sad affect Extremities: Bilateral lower extremity redness and tenderness.  No swelling noted  Data Review: I have personally reviewed the laboratory data and studies available.  F/u labs ordered Unresulted Labs (From admission, onward)     Start     Ordered   08/03/22 0500  Basic metabolic panel  Daily,   R      08/02/22 1326   08/03/22 0500  CBC with Differential/Platelet  Daily,   R      08/02/22 1326            Total time spent in review of labs and imaging, patient evaluation, formulation of plan, documentation and communication with family: 55 minutes  Signed, Lorin Glass, MD [facility] 08/02/2022

## 2022-08-02 NOTE — Evaluation (Signed)
Physical Therapy Evaluation Patient Details Name: [name] MRN: [id number] DOB: Jul 10, 1960 Today's Date: 08/02/2022  History of Present Illness  Pt is a 62 y/o F admitted on 08/01/22 after presenting to the ED per her nephrologist's recommendation 2/2 low Hgb associated with generalized weakness & fatigue. Pt received 2 units PRBCs. Pt is being treated for symptomatic anemia in the setting of anemia of chronic disease. PMH: CKD IV, anemia of chronic disease, HTN, GERD, peripheral neuropathy, chronic anxiety/depression, alcohol abuse, cannabis & cocaine abuse, asthma, COPD, heart murmur, HLD, PFO  Clinical Impression  Pt seen for PT evaluation with pt agreeable. Pt reports chronic pain in posterior BLE & back, as well generally feeling unwell. Pt is able to complete bed mobility with mod I, STS & step pivot without AD with mod I, and ambulation in room without AD with supervision. Anticipate with further medical management & as pt feels better she will be able to mobilize more. Recommend ongoing acute PT services to address high level balance, endurance & strength.     After gait, sitting in recliner at end of session: SPO2 92-96% on room air HR 80-89 bpm BP in LUE 161/81 mmHg MAP 104     Recommendations for follow up therapy are one component of a multi-disciplinary discharge planning process, led by the attending physician.  Recommendations may be updated based on patient status, additional functional criteria and insurance authorization.  Follow Up Recommendations       Assistance Recommended at Discharge PRN  Patient can return home with the following       Equipment Recommendations None recommended by PT  Recommendations for Other Services       Functional Status Assessment Patient has had a recent decline in their functional status and demonstrates the ability to make significant improvements in function in a reasonable and predictable amount of time.     Precautions /  Restrictions Precautions Precautions: None Restrictions Weight Bearing Restrictions: No      Mobility  Bed Mobility Overal bed mobility: Modified Independent             General bed mobility comments: supine>sit with HOB elevated    Transfers Overall transfer level: Needs assistance Equipment used: None Transfers: Bed to chair/wheelchair/BSC     Step pivot transfers: Modified independent (Device/Increase time) (no AD, step pivot bed>recliner on L)       General transfer comment: STS from EOB & recliner    Ambulation/Gait Ambulation/Gait assistance: Supervision Gait Distance (Feet): 25 Feet Assistive device: None Gait Pattern/deviations: Decreased stride length Gait velocity: decreased        Stairs            Wheelchair Mobility    Modified Rankin (Stroke Patients Only)       Balance Overall balance assessment: Needs assistance   Sitting balance-Leahy Scale: Good     Standing balance support: No upper extremity supported, During functional activity Standing balance-Leahy Scale: Good                               Pertinent Vitals/Pain Pain Assessment Pain Assessment: Faces Faces Pain Scale: Hurts even more Pain Location: chronic back & posterior BLE pain Pain Descriptors / Indicators: Discomfort, Grimacing Pain Intervention(s): Monitored during session, Limited activity within patient's tolerance, Repositioned    Home Living Family/patient expects to be discharged to:: Other (Comment)  Additional Comments: Presenter, broadcasting, lives alone    Prior Function Prior Level of Function : Independent/Modified Independent             Mobility Comments: Pt denies falls in the past 6 months, ambulatory without AD.       Hand Dominance        Extremity/Trunk Assessment   Upper Extremity Assessment Upper Extremity Assessment: Overall WFL for tasks assessed    Lower Extremity Assessment Lower  Extremity Assessment: Generalized weakness;Overall [facility] for tasks assessed    Cervical / Trunk Assessment Cervical / Trunk Assessment: Normal  Communication   Communication: No difficulties  Cognition Arousal/Alertness: Awake/alert Behavior During Therapy: WFL for tasks assessed/performed Overall Cognitive Status: Within Functional Limits for tasks assessed                                 General Comments: encouraged to sit in recliner for a short time        General Comments      Exercises     Assessment/Plan    PT Assessment Patient needs continued PT services  PT Problem List Decreased strength;Decreased activity tolerance;Decreased balance;Cardiopulmonary status limiting activity;Pain;Decreased mobility;Decreased safety awareness;Decreased knowledge of use of DME       PT Treatment Interventions DME instruction;Therapeutic exercise;Gait training;Balance training;Stair training;Neuromuscular re-education;Functional mobility training;Therapeutic activities;Patient/family education    PT Goals (Current goals can be found in the Care Plan section)  Acute Rehab PT Goals Patient Stated Goal: feel better PT Goal Formulation: With patient Time For Goal Achievement: 08/16/22 Potential to Achieve Goals: Good    Frequency Min 3X/week     Co-evaluation               AM-PAC PT "6 Clicks" Mobility  Outcome Measure Help needed turning from your back to your side while in a flat bed without using bedrails?: None Help needed moving from lying on your back to sitting on the side of a flat bed without using bedrails?: None Help needed moving to and from a bed to a chair (including a wheelchair)?: None Help needed standing up from a chair using your arms (e.g., wheelchair or bedside chair)?: None Help needed to walk in hospital room?: A Little Help needed climbing 3-5 steps with a railing? : A Little 6 Click Score: 22    End of Session   Activity Tolerance:  Patient limited by fatigue (limited by generalized feeling of unwell) Patient left: in chair;with chair alarm set;with call bell/phone within reach Nurse Communication: Mobility status PT Visit Diagnosis: Unsteadiness on feet (R26.81);Muscle weakness (generalized) (M62.81)    Time: 2952-8413 PT Time Calculation (min) (ACUTE ONLY): 11 min   Charges:   PT Evaluation $PT Eval Low Complexity: 1 Low          Aleda Grana, PT, DPT 08/02/22, 10:37 AM   Sandi Mariscal 08/02/2022, 10:35 AM
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
## 2022-08-03 LAB — CBC WITH DIFFERENTIAL/PLATELET
Abs Immature Granulocytes: 0.01 10*3/uL (ref 0.00–0.07)
Basophils Absolute: 0 10*3/uL (ref 0.0–0.1)
Basophils Relative: 1 %
Eosinophils Absolute: 0.3 10*3/uL (ref 0.0–0.5)
Eosinophils Relative: 5 %
HCT: 22.6 % — ABNORMAL LOW (ref 36.0–46.0)
Hemoglobin: 7.4 g/dL — ABNORMAL LOW (ref 12.0–15.0)
Immature Granulocytes: 0 %
Lymphocytes Relative: 22 %
Lymphs Abs: 1.1 10*3/uL (ref 0.7–4.0)
MCH: 30.5 pg (ref 26.0–34.0)
MCHC: 32.7 g/dL (ref 30.0–36.0)
MCV: 93 fL (ref 80.0–100.0)
Monocytes Absolute: 0.5 10*3/uL (ref 0.1–1.0)
Monocytes Relative: 9 %
Neutro Abs: 3.1 10*3/uL (ref 1.7–7.7)
Neutrophils Relative %: 63 %
Platelets: 306 10*3/uL (ref 150–400)
RBC: 2.43 MIL/uL — ABNORMAL LOW (ref 3.87–5.11)
RDW: 17.7 % — ABNORMAL HIGH (ref 11.5–15.5)
WBC: 4.9 10*3/uL (ref 4.0–10.5)
nRBC: 0 % (ref 0.0–0.2)

## 2022-08-03 LAB — BASIC METABOLIC PANEL
Anion gap: 12 (ref 5–15)
BUN: 38 mg/dL — ABNORMAL HIGH (ref 8–23)
CO2: 18 mmol/L — ABNORMAL LOW (ref 22–32)
Calcium: 7.8 mg/dL — ABNORMAL LOW (ref 8.9–10.3)
Chloride: 106 mmol/L (ref 98–111)
Creatinine, Ser: 4.39 mg/dL — ABNORMAL HIGH (ref 0.44–1.00)
GFR, Estimated: 11 mL/min — ABNORMAL LOW (ref >60)
Glucose, Bld: 90 mg/dL (ref 70–99)
Potassium: 3.4 mmol/L — ABNORMAL LOW (ref 3.5–5.1)
Sodium: 136 mmol/L (ref 135–145)

## 2022-08-03 MED ORDER — GABAPENTIN 100 MG PO CAPS
100.0000 mg | ORAL_CAPSULE | Freq: Three times a day (TID) | ORAL | Status: DC
  Administered 2022-08-03 – 2022-08-05 (×6): 100 mg via ORAL
  Filled 2022-08-03 (×6): qty 1

## 2022-08-03 MED ORDER — SODIUM CHLORIDE 0.9 % IV SOLN: INTRAVENOUS | Status: DC

## 2022-08-03 MED ORDER — AMLODIPINE BESYLATE 10 MG PO TABS
10.0000 mg | ORAL_TABLET | Freq: Every day | ORAL | Status: DC
  Administered 2022-08-04 – 2022-08-05 (×2): 10 mg via ORAL
  Filled 2022-08-03 (×2): qty 1

## 2022-08-03 MED ORDER — ORAL CARE MOUTH RINSE: 15.0000 mL | OROMUCOSAL | Status: DC | PRN

## 2022-08-03 NOTE — Evaluation (Signed)
Occupational Therapy Evaluation Patient Details Name: [name] MRN: [id number] DOB: January 26, 1961 Today's Date: 08/03/2022   History of Present Illness Pt is a 62 y/o F admitted on 08/01/22 after presenting to the ED per her nephrologist's recommendation 2/2 low Hgb associated with generalized weakness & fatigue. Pt received 2 units PRBCs. Pt is being treated for symptomatic anemia in the setting of anemia of chronic disease. PMH: CKD IV, anemia of chronic disease, HTN, GERD, peripheral neuropathy, chronic anxiety/depression, alcohol abuse, cannabis & cocaine abuse, asthma, COPD, heart murmur, HLD, PFO   Clinical Impression   PTA, pt independent with ADLs and functional mobility, lives in a first floor motel room, and reports she is relatively sedentary, and "does not walk a lot because her legs will swell". Pt currently needing set up - min guard A for ADLs, transfers, and bed mobility. Pt presenting with impairments listed below, will follow acutely. Anticipate no OT follow up needs at d/c.      Recommendations for follow up therapy are one component of a multi-disciplinary discharge planning process, led by the attending physician.  Recommendations may be updated based on patient status, additional functional criteria and insurance authorization.   Assistance Recommended at Discharge Set up Supervision/Assistance  Patient can return home with the following Assist for transportation;Help with stairs or ramp for entrance;Assistance with cooking/housework    Functional Status Assessment  Patient has had a recent decline in their functional status and demonstrates the ability to make significant improvements in function in a reasonable and predictable amount of time.  Equipment Recommendations  None recommended by OT    Recommendations for Other Services PT consult     Precautions / Restrictions Precautions Precautions: None Restrictions Weight Bearing Restrictions: No       Mobility Bed Mobility Overal bed mobility: Modified Independent                  Transfers Overall transfer level: Needs assistance Equipment used: None Transfers: Sit to/from Stand Sit to Stand: Min guard                  Balance Overall balance assessment: Needs assistance Sitting-balance support: Feet supported Sitting balance-Leahy Scale: Good     Standing balance support: No upper extremity supported, During functional activity Standing balance-Leahy Scale: Good                             ADL either performed or assessed with clinical judgement   ADL Overall ADL's : Needs assistance/impaired Eating/Feeding: Set up   Grooming: Standing;Min guard   Upper Body Bathing: Standing;Min guard   Lower Body Bathing: Min guard;Sit to/from stand   Upper Body Dressing : Min guard   Lower Body Dressing: Min guard   Toilet Transfer: Min guard;Ambulation;Regular Social worker and Hygiene: Min guard       Functional mobility during ADLs: Min guard       Vision   Vision Assessment?: No apparent visual deficits     Development worker, international aid Tested?: No   Praxis Praxis Praxis tested?: Not tested    Pertinent Vitals/Pain Pain Assessment Pain Assessment: Faces Pain Score: 6  Faces Pain Scale: Hurts even more Pain Location: chronic back & posterior BLE pain Pain Descriptors / Indicators: Discomfort, Grimacing Pain Intervention(s): Limited activity within patient's tolerance, Monitored during session     Hand Dominance Right   Extremity/Trunk Assessment Upper Extremity Assessment Upper Extremity Assessment:  Generalized weakness   Lower Extremity Assessment Lower Extremity Assessment: Defer to PT evaluation   Cervical / Trunk Assessment Cervical / Trunk Assessment: Normal   Communication Communication Communication: No difficulties   Cognition Arousal/Alertness: Awake/alert Behavior During  Therapy: WFL for tasks assessed/performed Overall Cognitive Status: Within Functional Limits for tasks assessed                                       General Comments  VSS on RA    Exercises     Shoulder Instructions      Home Living Family/patient expects to be discharged to:: Other (Comment)                                 Additional Comments: [employer], lives alone, first floor, handicap accessible bathroom      Prior Functioning/Environment Prior Level of Function : Independent/Modified Independent             Mobility Comments: Pt denies falls in the past 6 months, ambulatory without AD. ADLs Comments: does not sit for showers , ind wiht ADLs        OT Problem List: Decreased strength;Decreased range of motion;Decreased activity tolerance;Impaired balance (sitting and/or standing);Cardiopulmonary status limiting activity      OT Treatment/Interventions: Self-care/ADL training;Therapeutic exercise;Energy conservation;DME and/or AE instruction;Therapeutic activities;Balance training;Patient/family education    OT Goals(Current goals can be found in the care plan section) Acute Rehab OT Goals Patient Stated Goal: none stated OT Goal Formulation: With patient Time For Goal Achievement: 08/17/22 Potential to Achieve Goals: Good ADL Goals Pt Will Perform Upper Body Dressing: Independently;sitting Pt Will Perform Lower Body Dressing: Independently;sitting/lateral leans;sit to/from stand Pt Will Transfer to Toilet: Independently;ambulating;regular height toilet Pt Will Perform Tub/Shower Transfer: Tub transfer;Shower transfer;Independently;ambulating Additional ADL Goal #1: pt will verbalize 3 energy conservation strategies in prep for ADLs  OT Frequency: Min 1X/week    Co-evaluation              AM-PAC OT "6 Clicks" Daily Activity     Outcome Measure Help from another person eating meals?: None Help from another person  taking care of personal grooming?: None Help from another person toileting, which includes using toliet, bedpan, or urinal?: A Little Help from another person bathing (including washing, rinsing, drying)?: A Little Help from another person to put on and taking off regular upper body clothing?: A Little Help from another person to put on and taking off regular lower body clothing?: A Little 6 Click Score: 20   End of Session Nurse Communication: Mobility status  Activity Tolerance: Patient tolerated treatment well Patient left: in bed;with call bell/phone within reach;with bed alarm set  OT Visit Diagnosis: Unsteadiness on feet (R26.81);Other abnormalities of gait and mobility (R26.89);Muscle weakness (generalized) (M62.81)                Time: 1610-9604 OT Time Calculation (min): 13 min Charges:  OT General Charges $OT Visit: 1 Visit OT Evaluation $OT Eval Low Complexity: 1 Low  Carver Fila, OTD, OTR/L SecureChat Preferred Acute Rehab [phone]   Carver Fila Koonce 08/03/2022, 12:40 PM

## 2022-08-03 NOTE — Progress Notes (Signed)
PROGRESS NOTE  [name]  DOB: [birth year]  PCP: [name], MD ZOX:[id number]  DOA: 08/01/2022  LOS: 2 days  Hospital Day: 3  Brief narrative: [name] is a 62 y.o. female with PMH significant for CKD stage IV, DM2, HTN, HLD, chronic daily smoking, COPD, GERD, peripheral neuropathy, anxiety/depression who is homeless and currently living in a motel. 5/15, patient was seen at nephrologist Dr. [name] office.  Blood work collected. 5/16, blood work showed hemoglobin low and patient was directed to ED.  Patient endorses generalized weakness, fatigue for last several days.  In the ED, hemodynamically stable, blood pressure 150s Labs showed hemoglobin low at 5.6, creatinine elevated to 4.71 FOBT negative 2 units transfusion ordered Admitted to [facility]  Subjective: Patient was seen and examined this morning.  Lying down on bed.  Feels uncomfortable.  No specific localizing symptoms. Chart reviewed  Assessment and plan: Acute symptomatic anemia  Anemia of chronic disease Presented with generalized weakness and fatigue.  FOBT negative Hemoglobin was low at 5.6.  2 units PRBC transfusion given with improvement to 7.  Hemoglobin further up to 7.4 today. Continue to monitor Continue PPI.   Recent Labs    11/15/21 1228 11/15/21 1747 06/06/22 1410 06/07/22 1009 08/01/22 1308 08/02/22 0450 08/03/22 0204  HGB  --    < > 6.0* 6.1* 5.6* 7.0* 7.4*  MCV  --    < > 96 101.1* 104.4* 93.1 93.0  FERRITIN 11  --   --   --   --   --   --   TIBC 449  --   --   --   --   --   --   IRON 14*  --   --   --   --   --   --    < > = values in this interval not displayed.   AKI on CKD 4  Chronic metabolic acidosis Baseline creatinine close to 4.  Presented with creatinine elevated 4.71 and serum bicarb level low at 16.  Suspect worsening of renal function secondary to anemia and dehydration.   Creatinine improved after transfusion yesterday.  Worsened today.  Start on normal saline at  75 mill per hour Continue sodium bicarb supplement . Continue to monitor  Recent Labs    11/15/21 0528 11/16/21 0012 11/17/21 0504 11/18/21 0517 06/06/22 1410 06/07/22 1009 06/07/22 1615 08/01/22 1308 08/02/22 0450 08/03/22 0204  BUN 40* 43* 57* 61* 37* 34* 37* 37* 35* 38*  CREATININE 4.15* 4.46* 4.43* 4.16* 4.14* 4.07* 3.97* 4.71* 4.18* 4.39*  CO2 15* 14* 22 16* 13* 10* 12* 16* 16* 18*   Chronic bilateral lower extremity edema Patient reports bilateral lower extremity edema that improves with lying down and elevating legs. Doppler US lower extremity obtained..  No evidence of DVT noted. At the time of my examination this morning, she did not have pedal edema.  Patient reports significant pain because of neuropathy.  She was on gabapentin 100 mg 3 times daily at home.  Currently on hold.  I will resume it.   Chronic HFpEF  Hypertension Last 2D echo done on 11/15/2021 revealed LVEF 55 to 60% with grade 1 diastolic dysfunction. PTA on amlodipine 5 mg daily, Lasix 40 mg daily as needed Currently Lasix on hold.  Blood pressure elevated to 160s with amlodipine 5 mg alone.  Increase amlodipine to 10 mg daily.  COPD Continue bronchodilators   Chronic anxiety/depression PTA on Neurontin 100 mg 3 times daily,  buspirone 10 mg twice daily,   Generalized weakness PT OT assessment Fall precautions.   Mobility: Encourage ambulation.  PT eval ordered  Goals of care   Code Status: Full Code     DVT prophylaxis:  SCDs Start: 08/01/22 2008   Antimicrobials: None Fluid: None Consultants: None Family Communication: None at bedside  Status: Inpatient Level of care:  Telemetry Medical   Patient from: motel Anticipated d/c to: Hopefully back to motel in 1 to 2 days. Needs to continue in-hospital care:  Continue hemoglobin and creatinine trend.  Unable to discharge today because of elevated creatinine   Diet:  Diet Order             Diet regular Fluid consistency: Thin   Diet effective now                   Scheduled Meds:  [START ON 08/04/2022] amLODipine  10 mg Oral Daily   busPIRone  10 mg Oral BID   fluticasone furoate-vilanterol  1 puff Inhalation Daily   And   umeclidinium bromide  1 puff Inhalation Daily   gabapentin  100 mg Oral TID   multivitamin with minerals  1 tablet Oral Daily   pantoprazole  40 mg Oral Daily   sodium bicarbonate  1,300 mg Oral QID    PRN meds: acetaminophen, albuterol, HYDROmorphone (DILAUDID) injection, ipratropium-albuterol, melatonin, polyethylene glycol, prochlorperazine   Infusions:   sodium chloride 75 mL/hr at 08/03/22 1610    Antimicrobials: Anti-infectives (From admission, onward)    None       Nutritional status:  Body mass index is 19.44 kg/m.          Objective: Vitals:   08/03/22 0553 08/03/22 0927  BP: (!) 177/95 (!) 164/77  Pulse: 86 80  Resp: 18 17  Temp: 98.1 F (36.7 C) 98.2 F (36.8 C)  SpO2: 95% 95%    Intake/Output Summary (Last 24 hours) at 08/03/2022 1134 Last data filed at 08/03/2022 0800 Gross per 24 hour  Intake 1260 ml  Output --  Net 1260 ml   Filed Weights   08/01/22 2253 08/02/22 0500 08/03/22 0500  Weight: 56.7 kg 57.4 kg 56.3 kg   Weight change: -0.4 kg Body mass index is 19.44 kg/m.   Physical Exam: General exam: Pleasant, middle-aged Caucasian female Skin: No rashes, lesions or ulcers. HEENT: Atraumatic, normocephalic, no obvious bleeding Lungs: Clear to auscultation bilaterally CVS: Regular rate and rhythm, no murmur GI/Abd soft, nontender, nondistended, bowel sound present CNS: Alert, awake, oriented x 3 Psychiatry: Seems anxious. Extremities: Bilateral lower extremity tenderness to touch because of neuropathy.  No swelling noted  Data Review: I have personally reviewed the laboratory data and studies available.  F/u labs ordered Unresulted Labs (From admission, onward)     Start     Ordered   08/03/22 0500  Basic metabolic panel   Daily,   R      08/02/22 1326   08/03/22 0500  CBC with Differential/Platelet  Daily,   R      08/02/22 1326            Total time spent in review of labs and imaging, patient evaluation, formulation of plan, documentation and communication with family: 45 minutes  Signed, Lorin Glass, MD [facility] 08/03/2022

## 2022-08-04 LAB — CBC WITH DIFFERENTIAL/PLATELET
Abs Immature Granulocytes: 0.01 10*3/uL (ref 0.00–0.07)
Basophils Absolute: 0 10*3/uL (ref 0.0–0.1)
Basophils Relative: 1 %
Eosinophils Absolute: 0.3 10*3/uL (ref 0.0–0.5)
Eosinophils Relative: 5 %
HCT: 22.4 % — ABNORMAL LOW (ref 36.0–46.0)
Hemoglobin: 7.1 g/dL — ABNORMAL LOW (ref 12.0–15.0)
Immature Granulocytes: 0 %
Lymphocytes Relative: 22 %
Lymphs Abs: 1.2 10*3/uL (ref 0.7–4.0)
MCH: 29.8 pg (ref 26.0–34.0)
MCHC: 31.7 g/dL (ref 30.0–36.0)
MCV: 94.1 fL (ref 80.0–100.0)
Monocytes Absolute: 0.5 10*3/uL (ref 0.1–1.0)
Monocytes Relative: 9 %
Neutro Abs: 3.5 10*3/uL (ref 1.7–7.7)
Neutrophils Relative %: 63 %
Platelets: 297 10*3/uL (ref 150–400)
RBC: 2.38 MIL/uL — ABNORMAL LOW (ref 3.87–5.11)
RDW: 18.1 % — ABNORMAL HIGH (ref 11.5–15.5)
WBC: 5.5 10*3/uL (ref 4.0–10.5)
nRBC: 0.5 % — ABNORMAL HIGH (ref 0.0–0.2)

## 2022-08-04 LAB — BASIC METABOLIC PANEL
Anion gap: 11 (ref 5–15)
BUN: 42 mg/dL — ABNORMAL HIGH (ref 8–23)
CO2: 17 mmol/L — ABNORMAL LOW (ref 22–32)
Calcium: 7.5 mg/dL — ABNORMAL LOW (ref 8.9–10.3)
Chloride: 108 mmol/L (ref 98–111)
Creatinine, Ser: 4.22 mg/dL — ABNORMAL HIGH (ref 0.44–1.00)
GFR, Estimated: 11 mL/min — ABNORMAL LOW (ref >60)
Glucose, Bld: 94 mg/dL (ref 70–99)
Potassium: 3.4 mmol/L — ABNORMAL LOW (ref 3.5–5.1)
Sodium: 136 mmol/L (ref 135–145)

## 2022-08-04 LAB — FERRITIN: Ferritin: 39 ng/mL (ref 11–307)

## 2022-08-04 MED ORDER — OXYCODONE-ACETAMINOPHEN 5-325 MG PO TABS
1.0000 | ORAL_TABLET | Freq: Four times a day (QID) | ORAL | Status: DC | PRN
  Administered 2022-08-04 – 2022-08-05 (×4): 1 via ORAL
  Filled 2022-08-04 (×4): qty 1

## 2022-08-04 MED ORDER — SODIUM CHLORIDE 0.9 % IV SOLN
250.0000 mg | Freq: Once | INTRAVENOUS | Status: AC
  Administered 2022-08-04: 250 mg via INTRAVENOUS
  Filled 2022-08-04: qty 20

## 2022-08-04 NOTE — Progress Notes (Signed)
PROGRESS NOTE  [name]  DOB: [birth year]  PCP: [name], MD ZOX:[id number]  DOA: 08/01/2022  LOS: 3 days  Hospital Day: 4  Brief narrative: [name] is a 62 y.o. female with PMH significant for CKD stage IV, DM2, HTN, HLD, chronic daily smoking, COPD, GERD, peripheral neuropathy, anxiety/depression who is homeless and currently living in a motel. 5/15, patient was seen at nephrologist Dr. [name] office.  Blood work collected. 5/16, blood work showed hemoglobin low and patient was directed to ED.  Patient endorses generalized weakness, fatigue for last several days.  In the ED, hemodynamically stable, blood pressure 150s Labs showed hemoglobin low at 5.6, creatinine elevated to 4.71 FOBT negative 2 units transfusion ordered Admitted to [facility]  Subjective: Patient was seen and examined this morning.  Lying down in bed.  Not in distress.  Feels tired. Labs from this morning with gradually improving creatinine.  Hemoglobin continues to drop despite no evidence of bleeding.  Assessment and plan: Acute symptomatic anemia  Chronic iron deficiency anemia Presented with generalized weakness and fatigue.  FOBT negative Hemoglobin was low at 5.6.  2 units PRBC transfusion given with improvement to 7.  Hemoglobin further up.  Stable above 7 for last 2 days. Chart reviewed.  She had a EGD 2023 and colonoscopy 2022 by Dr. Chales Abrahams.  She had known gastritis and diverticulosis.  Suspect chronic slow bleeding.  Ferritin level low at 39.  Will give 1 dose of iron infusion Continue to monitor Continue PPI.   Recent Labs    11/15/21 1228 11/15/21 1747 06/07/22 1009 08/01/22 1308 08/02/22 0450 08/03/22 0204 08/04/22 0207 08/04/22 1023  HGB  --    < > 6.1* 5.6* 7.0* 7.4* 7.1*  --   MCV  --    < > 101.1* 104.4* 93.1 93.0 94.1  --   FERRITIN 11  --   --   --   --   --   --  39  TIBC 449  --   --   --   --   --   --   --   IRON 14*  --   --   --   --   --   --   --    < > =  values in this interval not displayed.   AKI on CKD 4  Chronic metabolic acidosis Baseline creatinine close to 4.  Presented with creatinine elevated 4.71 and serum bicarb level low at 16.  Suspect worsening of renal function secondary to anemia and dehydration.   Creatinine trend as below.  Improving, 4.22 today.  Continue normal saline for next 24 hours.. Continue sodium bicarb supplement . Continue to monitor  Recent Labs    11/16/21 0012 11/17/21 0504 11/18/21 0517 06/06/22 1410 06/07/22 1009 06/07/22 1615 08/01/22 1308 08/02/22 0450 08/03/22 0204 08/04/22 0207  BUN 43* 57* 61* 37* 34* 37* 37* 35* 38* 42*  CREATININE 4.46* 4.43* 4.16* 4.14* 4.07* 3.97* 4.71* 4.18* 4.39* 4.22*  CO2 14* 22 16* 13* 10* 12* 16* 16* 18* 17*   Chronic bilateral lower extremity edema Patient reports bilateral lower extremity edema that improves with lying down and elevating legs. Doppler US lower extremity obtained..  No evidence of DVT noted. No pedal edema.  Tenderness due to neuropathy present.  Gabapentin resumed yesterday.   Chronic HFpEF  Hypertension Last 2D echo done on 11/15/2021 revealed LVEF 55 to 60% with grade 1 diastolic dysfunction. PTA on amlodipine 5 mg daily, Lasix  40 mg daily as needed Currently Lasix on hold.  Blood pressure was elevated.  Amlodipine dose was increased to 10 mg daily.  Creatinine daily improving.  Continue to monitor  COPD Continue bronchodilators   Chronic anxiety/depression PTA on Neurontin 100 mg 3 times daily, buspirone 10 mg twice daily Continue the same.   Generalized weakness PT/OT assessment Fall precautions.   Mobility: Encourage ambulation.  PT eval ordered  Goals of care   Code Status: Full Code     DVT prophylaxis:  SCDs Start: 08/01/22 2008   Antimicrobials: None Fluid: None Consultants: None Family Communication: None at bedside  Status: Inpatient Level of care:  Telemetry Medical   Patient from: motel Anticipated d/c  to: Hopefully back to motel in 1 to 2 days. Needs to continue in-hospital care:  Continue hemoglobin and creatinine trend.  If hemoglobin and creatinine improved, plan to discharge tomorrow.   Diet:  Diet Order             Diet regular Fluid consistency: Thin  Diet effective now                   Scheduled Meds:  amLODipine  10 mg Oral Daily   busPIRone  10 mg Oral BID   fluticasone furoate-vilanterol  1 puff Inhalation Daily   And   umeclidinium bromide  1 puff Inhalation Daily   gabapentin  100 mg Oral TID   multivitamin with minerals  1 tablet Oral Daily   pantoprazole  40 mg Oral Daily   sodium bicarbonate  1,300 mg Oral QID    PRN meds: albuterol, ipratropium-albuterol, melatonin, mouth rinse, oxyCODONE-acetaminophen, polyethylene glycol, prochlorperazine   Infusions:   sodium chloride 75 mL/hr at 08/04/22 1308   ferric gluconate (FERRLECIT) IVPB      Antimicrobials: Anti-infectives (From admission, onward)    None       Nutritional status:  Body mass index is 19.68 kg/m.          Objective: Vitals:   08/03/22 1955 08/04/22 0526  BP: (!) 183/87 (!) 161/82  Pulse: 99 75  Resp: 20 16  Temp: 99.2 F (37.3 C) 98.9 F (37.2 C)  SpO2: 94% 93%    Intake/Output Summary (Last 24 hours) at 08/04/2022 1351 Last data filed at 08/04/2022 0600 Gross per 24 hour  Intake 1820.44 ml  Output --  Net 1820.44 ml   Filed Weights   08/02/22 0500 08/03/22 0500 08/04/22 0526  Weight: 57.4 kg 56.3 kg 57 kg   Weight change: 0.7 kg Body mass index is 19.68 kg/m.   Physical Exam: General exam: Pleasant, middle-aged Caucasian female Skin: No rashes, lesions or ulcers. HEENT: Atraumatic, normocephalic, no obvious bleeding Lungs: Clear to auscultation bilaterally CVS: Regular rate and rhythm, no murmur GI/Abd soft, nontender, nondistended, bowel sound present CNS: Alert, awake, oriented x 3 Psychiatry: Mood appropriate. Extremities: Bilateral lower  extremity tenderness to touch because of neuropathy.  No swelling noted  Data Review: I have personally reviewed the laboratory data and studies available.  F/u labs ordered Unresulted Labs (From admission, onward)     Start     Ordered   08/03/22 0500  Basic metabolic panel  Daily,   R      08/02/22 1326   08/03/22 0500  CBC with Differential/Platelet  Daily,   R      08/02/22 1326            Total time spent in review of labs and imaging, patient  evaluation, formulation of plan, documentation and communication with family: 45 minutes  Signed, Lorin Glass, MD [facility] 08/04/2022

## 2022-08-05 ENCOUNTER — Other Ambulatory Visit: Payer: Self-pay

## 2022-08-05 LAB — CBC WITH DIFFERENTIAL/PLATELET
Abs Immature Granulocytes: 0.02 10*3/uL (ref 0.00–0.07)
Basophils Absolute: 0 10*3/uL (ref 0.0–0.1)
Basophils Relative: 1 %
Eosinophils Absolute: 0.3 10*3/uL (ref 0.0–0.5)
Eosinophils Relative: 6 %
HCT: 22.4 % — ABNORMAL LOW (ref 36.0–46.0)
Hemoglobin: 7.1 g/dL — ABNORMAL LOW (ref 12.0–15.0)
Immature Granulocytes: 0 %
Lymphocytes Relative: 23 %
Lymphs Abs: 1.2 10*3/uL (ref 0.7–4.0)
MCH: 30.2 pg (ref 26.0–34.0)
MCHC: 31.7 g/dL (ref 30.0–36.0)
MCV: 95.3 fL (ref 80.0–100.0)
Monocytes Absolute: 0.6 10*3/uL (ref 0.1–1.0)
Monocytes Relative: 11 %
Neutro Abs: 3.2 10*3/uL (ref 1.7–7.7)
Neutrophils Relative %: 59 %
Platelets: 281 10*3/uL (ref 150–400)
RBC: 2.35 MIL/uL — ABNORMAL LOW (ref 3.87–5.11)
RDW: 17.8 % — ABNORMAL HIGH (ref 11.5–15.5)
WBC: 5.4 10*3/uL (ref 4.0–10.5)
nRBC: 0 % (ref 0.0–0.2)

## 2022-08-05 LAB — BASIC METABOLIC PANEL
Anion gap: 10 (ref 5–15)
BUN: 47 mg/dL — ABNORMAL HIGH (ref 8–23)
CO2: 16 mmol/L — ABNORMAL LOW (ref 22–32)
Calcium: 7.8 mg/dL — ABNORMAL LOW (ref 8.9–10.3)
Chloride: 109 mmol/L (ref 98–111)
Creatinine, Ser: 4.19 mg/dL — ABNORMAL HIGH (ref 0.44–1.00)
GFR, Estimated: 11 mL/min — ABNORMAL LOW (ref >60)
Glucose, Bld: 91 mg/dL (ref 70–99)
Potassium: 3.8 mmol/L (ref 3.5–5.1)
Sodium: 135 mmol/L (ref 135–145)

## 2022-08-05 MED ORDER — OXYCODONE-ACETAMINOPHEN 5-325 MG PO TABS
1.0000 | ORAL_TABLET | Freq: Three times a day (TID) | ORAL | 0 refills | Status: DC | PRN
  Filled 2022-08-05: qty 15, 5d supply, fill #0

## 2022-08-05 MED ORDER — PANTOPRAZOLE SODIUM 40 MG PO TBEC
40.0000 mg | DELAYED_RELEASE_TABLET | Freq: Every day | ORAL | 0 refills | Status: DC
Start: 2022-08-05 — End: 2022-11-02
  Filled 2022-08-05: qty 90, 90d supply, fill #0

## 2022-08-05 MED ORDER — DARBEPOETIN ALFA 100 MCG/0.5ML IJ SOSY
100.0000 ug | PREFILLED_SYRINGE | Freq: Once | INTRAMUSCULAR | Status: AC
  Administered 2022-08-05: 100 ug via SUBCUTANEOUS
  Filled 2022-08-05: qty 0.5

## 2022-08-05 MED ORDER — FERROUS SULFATE 325 (65 FE) MG PO TABS
325.0000 mg | ORAL_TABLET | Freq: Every day | ORAL | 0 refills | Status: DC
  Filled 2022-08-05: qty 90, 90d supply, fill #0

## 2022-08-05 MED ORDER — ALBUTEROL SULFATE HFA 108 (90 BASE) MCG/ACT IN AERS
2.0000 | INHALATION_SPRAY | Freq: Four times a day (QID) | RESPIRATORY_TRACT | Status: DC | PRN
  Administered 2022-08-05: 2 via RESPIRATORY_TRACT
  Filled 2022-08-05: qty 6.7

## 2022-08-05 MED ORDER — DARBEPOETIN ALFA 25 MCG/0.42ML IJ SOSY
25.0000 ug | PREFILLED_SYRINGE | Freq: Once | INTRAMUSCULAR | Status: DC
  Filled 2022-08-05: qty 0.42

## 2022-08-05 MED ORDER — AMLODIPINE BESYLATE 10 MG PO TABS
10.0000 mg | ORAL_TABLET | Freq: Every day | ORAL | 0 refills | Status: DC
  Filled 2022-08-05: qty 90, 90d supply, fill #0

## 2022-08-05 NOTE — Progress Notes (Signed)
Discharge instructions (including medications) discussed with and copy provided to patient/caregiver 

## 2022-08-05 NOTE — Plan of Care (Signed)
  Problem: Education: Goal: Knowledge of disease and its progression will improve Outcome: Adequate for Discharge Goal: Individualized Educational Video(s) Outcome: Adequate for Discharge   Problem: Fluid Volume: Goal: Compliance with measures to maintain balanced fluid volume will improve Outcome: Adequate for Discharge   Problem: Health Behavior/Discharge Planning: Goal: Ability to manage health-related needs will improve Outcome: Adequate for Discharge   Problem: Nutritional: Goal: Ability to make healthy dietary choices will improve Outcome: Adequate for Discharge   Problem: Clinical Measurements: Goal: Complications related to the disease process, condition or treatment will be avoided or minimized Outcome: Adequate for Discharge   Problem: Acute Rehab PT Goals(only PT should resolve) Goal: Pt Will Ambulate Outcome: Adequate for Discharge Goal: Pt Will Go Up/Down Stairs Outcome: Adequate for Discharge Goal: Pt/caregiver will Perform Home Exercise Program Outcome: Adequate for Discharge   Problem: Acute Rehab OT Goals (only OT should resolve) Goal: Pt. Will Perform Upper Body Dressing Outcome: Adequate for Discharge Goal: Pt. Will Perform Lower Body Dressing Outcome: Adequate for Discharge Goal: Pt. Will Transfer To Toilet Outcome: Adequate for Discharge Goal: Pt. Will Perform Tub/Shower Transfer Outcome: Adequate for Discharge Goal: OT Additional ADL Goal #1 Outcome: Adequate for Discharge

## 2022-08-05 NOTE — Care Management Important Message (Signed)
Important Message  Patient Details  Name: [name] MRN: [id number] Date of Birth: 12-Mar-1961   Medicare Important Message Given:  Yes     Isabel Ardila 08/05/2022, 2:50 PM

## 2022-08-05 NOTE — Progress Notes (Signed)
Physical Therapy Treatment Patient Details Name: [name] MRN: [id number] DOB: 1960/08/29 Today's Date: 08/05/2022   History of Present Illness Pt is a 62 y/o F admitted on 08/01/22 after presenting to the ED per her nephrologist's recommendation 2/2 low Hgb associated with generalized weakness & fatigue. Pt received 2 units PRBCs. Pt is being treated for symptomatic anemia in the setting of anemia of chronic disease. PMH: CKD IV, anemia of chronic disease, HTN, GERD, peripheral neuropathy, chronic anxiety/depression, alcohol abuse, cannabis & cocaine abuse, asthma, COPD, heart murmur, HLD, PFO    PT Comments    Pt progressing towards physical therapy goals. Pt was able to perform transfers and ambulation with mod I to supervision for safety and [facility] for support. Pt anticipates d/c back to her motel room this afternoon. She declines any further PT needs today but is asking for the cane prior to d/c. Feel this is reasonable to decrease risk for falls. Will continue to follow.     Recommendations for follow up therapy are one component of a multi-disciplinary discharge planning process, led by the attending physician.  Recommendations may be updated based on patient status, additional functional criteria and insurance authorization.  Follow Up Recommendations       Assistance Recommended at Discharge PRN  Patient can return home with the following Assist for transportation   Equipment Recommendations  Cane    Recommendations for Other Services       Precautions / Restrictions Precautions Precautions: None Restrictions Weight Bearing Restrictions: No     Mobility  Bed Mobility Overal bed mobility: Modified Independent             General bed mobility comments: supine<>sit with HOB elevated    Transfers Overall transfer level: Modified independent Equipment used: Straight cane Transfers: Sit to/from Stand             General transfer comment: Pt able to stand  without assistance. Decreased awareness of IV pole/lines and required cues for safety.    Ambulation/Gait Ambulation/Gait assistance: Supervision, Min guard Gait Distance (Feet): 200 Feet Assistive device: Straight cane Gait Pattern/deviations: Decreased stride length Gait velocity: decreased Gait velocity interpretation: <1.8 ft/sec, indicate of risk for recurrent falls   General Gait Details: Pt utilizing cane without difficulty. Reports feeling more stable with it and no overt LOB noted throughout gait training. However, pt appears unsteady at times and although does not require assistance, supervision to close guar provided intermittently.   Stairs             Wheelchair Mobility    Modified Rankin (Stroke Patients Only)       Balance Overall balance assessment: Needs assistance Sitting-balance support: Feet supported Sitting balance-Leahy Scale: Good     Standing balance support: No upper extremity supported, During functional activity Standing balance-Leahy Scale: Good                              Cognition Arousal/Alertness: Awake/alert Behavior During Therapy: WFL for tasks assessed/performed Overall Cognitive Status: Within Functional Limits for tasks assessed                                          Exercises      General Comments        Pertinent Vitals/Pain Pain Assessment Pain Assessment: Faces Faces Pain Scale: Hurts even  more Pain Location: chronic back & posterior BLE pain Pain Descriptors / Indicators: Discomfort, Grimacing    Home Living Family/patient expects to be discharged to:: Other (Comment)                   Additional Comments: [employer], lives alone, first floor, handicap accessible bathroom    Prior Function            PT Goals (current goals can now be found in the care plan section) Acute Rehab PT Goals Patient Stated Goal: feel better PT Goal Formulation: With  patient Time For Goal Achievement: 08/16/22 Potential to Achieve Goals: Good Progress towards PT goals: Progressing toward goals    Frequency    Min 3X/week      PT Plan Current plan remains appropriate    Co-evaluation              AM-PAC PT "6 Clicks" Mobility   Outcome Measure  Help needed turning from your back to your side while in a flat bed without using bedrails?: None Help needed moving from lying on your back to sitting on the side of a flat bed without using bedrails?: None Help needed moving to and from a bed to a chair (including a wheelchair)?: None Help needed standing up from a chair using your arms (e.g., wheelchair or bedside chair)?: None Help needed to walk in hospital room?: A Little Help needed climbing 3-5 steps with a railing? : A Little 6 Click Score: 22    End of Session Equipment Utilized During Treatment: Gait belt Activity Tolerance: Patient tolerated treatment well Patient left: in bed;with call bell/phone within reach Nurse Communication: Mobility status PT Visit Diagnosis: Unsteadiness on feet (R26.81);Muscle weakness (generalized) (M62.81)     Time: 4098-1191 PT Time Calculation (min) (ACUTE ONLY): 16 min  Charges:  $Gait Training: 8-22 mins                     Conni Slipper, PT, DPT Acute Rehabilitation Services Secure Chat Preferred Office: [phone]    Marylynn Pearson 08/05/2022, 1:04 PM

## 2022-08-05 NOTE — TOC Transition Note (Signed)
Transition of Care [facility]) - CM/SW Discharge Note   Patient Details  Name: [name] MRN: [id number] Date of Birth: 1960/11/04  Transition of Care [facility]) CM/SW Contact:  Tom-[name], Hershal Coria, RN Phone Number: 08/05/2022, 1:33 PM   Clinical Narrative:     Patient is scheduled for discharge today.  Readmission Risk Assessment done. Outpatient f/u, hospital f/u and discharge instructions on AVS. No TOC needs or recommendations noted. Family to transport at discharge.  No further TOC needs noted.        Final next level of care: Other (comment) ([employer]) Barriers to Discharge: Barriers Resolved   Patient Goals and CMS Choice CMS Medicare.gov Compare Post Acute Care list provided to:: Patient Choice offered to / list presented to : NA  Discharge Placement                  Patient to be transferred to facility by: Family      Discharge Plan and Services Additional resources added to the After Visit Summary for     Discharge Planning Services: CM Consult Post Acute Care Choice: NA          DME Arranged: N/A DME Agency: NA       HH Arranged: NA HH Agency: NA        Social Determinants of Health (SDOH) Interventions SDOH Screenings   Food Insecurity: No Food Insecurity (08/02/2022)  Housing: Patient Unable To Answer (08/02/2022)  Transportation Needs: Unmet Transportation Needs (08/02/2022)  Utilities: Not At Risk (08/02/2022)  Depression (PHQ2-9): Medium Risk (01/21/2022)  Tobacco Use: High Risk (08/01/2022)     Readmission Risk Interventions    08/02/2022    1:56 PM 11/16/2021   12:16 PM  Readmission Risk Prevention Plan  Transportation Screening Complete Complete  PCP or Specialist Appt within 3-5 Days  Complete  HRI or Home Care Consult  Complete  Social Work Consult for Recovery Care Planning/Counseling  Complete  Palliative Care Screening  Not Applicable  Medication Review [employer]) Referral to Pharmacy Complete  PCP or Specialist  appointment within 3-5 days of discharge Complete   HRI or Home Care Consult Complete   SW Recovery Care/Counseling Consult Complete   Palliative Care Screening Not Applicable   Skilled Nursing Facility Not Applicable

## 2022-08-05 NOTE — Discharge Summary (Signed)
Physician Discharge Summary  [name]:[id number] DOB: 08-19-1960 DOA: 08/01/2022  PCP: [name], MD  Admit date: 08/01/2022 Discharge date: 08/05/2022  Admitted From: Motel Discharge disposition: Back to motel  Recommendations at discharge:  Continue PPI.  Ensure compliance with iron pills Amlodipine dose has been increased.  Lasix has been held Judicious use of pain medicines Follow-up with nephrology as an outpatient.   Brief narrative: [name] is a 62 y.o. female with PMH significant for CKD stage IV, DM2, HTN, HLD, chronic daily smoking, COPD, GERD, peripheral neuropathy, anxiety/depression who is homeless and currently living in a motel. 5/15, patient was seen at nephrologist Dr. [name] office.  Blood work collected. 5/16, blood work showed hemoglobin low and patient was directed to ED.  Patient endorses generalized weakness, fatigue for last several days.  In the ED, hemodynamically stable, blood pressure 150s Labs showed hemoglobin low at 5.6, creatinine elevated to 4.71 FOBT negative 2 units transfusion ordered Admitted to [facility]  Subjective: Patient was seen and examined this morning.   Lying on bed.  Not in distress.  No new symptoms.  Has received adequate hydration.  Assessment and plan: Acute symptomatic anemia  Chronic anemia due to CKD and iron deficiency due to chronic GI bleed Presented with generalized weakness and fatigue.   Hemoglobin was low at 5.6.  2 units PRBC transfusion given with improvement to 7.  Hemoglobin remained stable above 7 for last 3 days. Chart reviewed.  She had a EGD 2023 and colonoscopy 2022 by Dr. Chales Abrahams.  She had known gastritis and diverticulosis.  Suspect chronic slow bleeding.  However FOBT negative this admission Ferritin level low at 39.  I gave 1 dose of IV iron infusion.  Also started on oral iron pills. Discussed with nephrologist Dr. [name] this morning.  1 dose of darbepoetin was given as well. Continue  PPI as before. Recent Labs    11/15/21 1228 11/15/21 1747 08/01/22 1308 08/02/22 0450 08/03/22 0204 08/04/22 0207 08/04/22 1023 08/05/22 0149  HGB  --    < > 5.6* 7.0* 7.4* 7.1*  --  7.1*  MCV  --    < > 104.4* 93.1 93.0 94.1  --  95.3  FERRITIN 11  --   --   --   --   --  39  --   TIBC 449  --   --   --   --   --   --   --   IRON 14*  --   --   --   --   --   --   --    < > = values in this interval not displayed.   AKI on CKD 4  Chronic metabolic acidosis Baseline creatinine close to 4.  Presented with creatinine elevated 4.71 and serum bicarb level low at 16.  Suspect worsening of renal function secondary to anemia and dehydration.   Creatinine trend as below.  Improving, 4.19 today.  She has been given adequate IV hydration.  Discussed with nephrologist Dr. [name] this morning.  Her creatinine is close to her baseline.  Can be discharged to follow-up with her nephrologist Dr. [name] as an outpatient.  Continue sodium bicarb supplement as before.  Recent Labs    11/17/21 0504 11/18/21 0517 06/06/22 1410 06/07/22 1009 06/07/22 1615 08/01/22 1308 08/02/22 0450 08/03/22 0204 08/04/22 0207 08/05/22 0149  BUN 57* 61* 37* 34* 37* 37* 35* 38* 42* 47*  CREATININE 4.43* 4.16* 4.14* 4.07*  3.97* 4.71* 4.18* 4.39* 4.22* 4.19*  CO2 22 16* 13* 10* 12* 16* 16* 18* 17* 16*   Chronic bilateral lower extremity edema Patient reports bilateral lower extremity edema that improves with lying down and elevating legs. Doppler US lower extremity obtained..  No evidence of DVT noted. No pedal edema.  Tenderness due to neuropathy present.  Gabapentin resumed   Chronic HFpEF  Hypertension Last 2D echo done on 11/15/2021 revealed LVEF 55 to 60% with grade 1 diastolic dysfunction. PTA on amlodipine 5 mg daily, Lasix 40 mg daily as needed Currently Lasix on hold. Amlodipine dose was increased to 10 mg daily.  Blood pressure improving.  Discharge on amlodipine 10 mg daily.  COPD Continue  bronchodilators   Chronic anxiety/depression PTA on Neurontin 100 mg 3 times daily, buspirone 10 mg twice daily Continue the same.  Also on as needed Percocet.   Generalized weakness PT/OT eval obtained.  No PT follow-up recommended.  Goals of care   Code Status: Full Code   Wounds:  -    Discharge Exam:   Vitals:   08/04/22 2014 08/05/22 0509 08/05/22 0828 08/05/22 0857  BP: (!) 164/77 (!) 153/71 (!) 142/83   Pulse: 79 74 87 74  Resp: 20 18 16 16   Temp: 97.9 F (36.6 C) 97.8 F (36.6 C) 98.3 F (36.8 C)   TempSrc: Oral Oral Oral   SpO2: 93% 92% 91% 94%  Weight:      Height:        Body mass index is 19.68 kg/m.   General exam: Pleasant, middle-aged Caucasian female Skin: No rashes, lesions or ulcers. HEENT: Atraumatic, normocephalic, no obvious bleeding Lungs: Clear to auscultation bilaterally CVS: Regular rate and rhythm, no murmur GI/Abd soft, nontender, nondistended, bowel sound present CNS: Alert, awake, oriented x 3 Psychiatry: Mood appropriate. Extremities: Bilateral lower extremity tenderness to touch because of neuropathy.  No swelling noted  Follow ups:    Follow-up Information     [name], MD Follow up.   Specialty: Internal Medicine Contact information: [address] [place] [address] [place] [address] [phone]                 Discharge Instructions:   Discharge Instructions     Call MD for:  difficulty breathing, headache or visual disturbances   Complete by: As directed    Call MD for:  extreme fatigue   Complete by: As directed    Call MD for:  hives   Complete by: As directed    Call MD for:  persistant dizziness or light-headedness   Complete by: As directed    Call MD for:  persistant nausea and vomiting   Complete by: As directed    Call MD for:  severe uncontrolled pain   Complete by: As directed    Call MD for:  temperature >100.4   Complete by: As directed    Diet - low sodium heart healthy    Complete by: As directed    Discharge instructions   Complete by: As directed    Recommendations at discharge:   Continue PPI.  Ensure compliance with iron pills  Amlodipine dose has been increased.  Lasix has been held  Judicious use of pain medicines  Follow-up with nephrology as an outpatient.  General discharge instructions: Follow with Primary MD [name], MD in 7 days  Please request your PCP  to go over your hospital tests, procedures, radiology results at the follow up. Please get  your medicines reviewed and adjusted.  Your PCP may decide to repeat certain labs or tests as needed. Do not drive, operate heavy machinery, perform activities at heights, swimming or participation in water activities or provide baby sitting services if your were admitted for syncope or siezures until you have seen by Primary MD or a Neurologist and advised to do so again. [place] Controlled Substance Reporting System database was reviewed. Do not drive, operate heavy machinery, perform activities at heights, swim, participate in water activities or provide baby-sitting services while on medications for pain, sleep and mood until your outpatient physician has reevaluated you and advised to do so again.  You are strongly recommended to comply with the dose, frequency and duration of prescribed medications. Activity: As tolerated with Full fall precautions use walker/cane & assistance as needed Avoid using any recreational substances like cigarette, tobacco, alcohol, or non-prescribed drug. If you experience worsening of your admission symptoms, develop shortness of breath, life threatening emergency, suicidal or homicidal thoughts you must seek medical attention immediately by calling 911 or calling your MD immediately  if symptoms less severe. You must read complete instructions/literature along with all the possible adverse reactions/side effects for all the medicines you take and that have  been prescribed to you. Take any new medicine only after you have completely understood and accepted all the possible adverse reactions/side effects.  Wear Seat belts while driving. You were cared for by a hospitalist during your hospital stay. If you have any questions about your discharge medications or the care you received while you were in the hospital after you are discharged, you can call the unit and ask to speak with the hospitalist or the covering physician. Once you are discharged, your primary care physician will handle any further medical issues. Please note that NO REFILLS for any discharge medications will be authorized once you are discharged, as it is imperative that you return to your primary care physician (or establish a relationship with a primary care physician if you do not have one).   Increase activity slowly   Complete by: As directed        Discharge Medications:   Allergies as of 08/05/2022       Reactions   Zestril [lisinopril] Other (See Comments)   Hyperkalemia        Medication List     STOP taking these medications    furosemide 40 MG tablet Commonly known as: LASIX   mupirocin ointment 2 % Commonly known as: BACTROBAN       TAKE these medications    albuterol 108 (90 Base) MCG/ACT inhaler Commonly known as: VENTOLIN HFA INHALE 2 PUFFS INTO THE LUNGS EVERY 6 HOURS AS NEEDED FOR WHEEZING OR SHORTNESS OF BREATH.   amLODipine 10 MG tablet Commonly known as: NORVASC Take 1 tablet (10 mg total) by mouth daily. Start taking on: Aug 06, 2022 What changed:  medication strength how much to take   busPIRone 10 MG tablet Commonly known as: BUSPAR Take 1 tablet (10 mg total) by mouth 2 (two) times daily.   ferrous sulfate 325 (65 FE) MG EC tablet Take 1 tablet (325 mg total) by mouth daily with breakfast.   gabapentin 100 MG capsule Commonly known as: NEURONTIN Take 1 capsule (100 mg total) by mouth 3 (three) times daily.    ipratropium-albuterol 0.5-2.5 (3) MG/3ML Soln Commonly known as: DUONEB Take 3 mLs by nebulization every 6 (six) hours as needed (shortness of breath and wheezing).  multivitamin with minerals Tabs tablet Take 1 tablet by mouth daily.   oxyCODONE-acetaminophen 5-325 MG tablet Commonly known as: PERCOCET/ROXICET Take 1 tablet by mouth every 8 (eight) hours as needed for up to 5 days for severe pain or moderate pain.   pantoprazole 40 MG tablet Commonly known as: PROTONIX Take 1 tablet (40 mg total) by mouth daily.   sodium bicarbonate 650 MG tablet Take 2 tablets (1,300 mg total) by mouth 3 (three) times daily.   traZODone 50 MG tablet Commonly known as: DESYREL Take 1 tablet (50 mg total) by mouth at bedtime as needed for sleep.   Trelegy Ellipta 100-62.5-25 MCG/ACT Aepb Generic drug: Fluticasone-Umeclidin-Vilant Inhale 1 puff into the lungs daily.         The results of significant diagnostics from this hospitalization (including imaging, microbiology, ancillary and laboratory) are listed below for reference.    Procedures and Diagnostic Studies:   VAS [place] LOWER EXTREMITY VENOUS (DVT)  Result Date: 08/02/2022  Lower Venous DVT Study Patient Name:  NUBIAN GAUL Nass  Date of Exam:   08/02/2022 Medical Rec #: [id number]       Accession #:    [id number] Date of Birth: [birth year]       Patient Gender: F Patient Age:   40 years Exam Location:  [facility] Procedure:      VAS [place] LOWER EXTREMITY VENOUS (DVT) Referring Phys: Enid Derry HALL --------------------------------------------------------------------------------  Indications: Edema, and Swelling. Other Indications: Advanced CKD stage IV. Comparison Study: No priors. Performing Technologist: Marilynne Halsted RDMS, RVT  Examination Guidelines: A complete evaluation includes B-mode imaging, spectral Doppler, color Doppler, and power Doppler as needed of all accessible portions of each vessel. Bilateral testing is considered an  integral part of a complete examination. Limited examinations for reoccurring indications may be performed as noted. The reflux portion of the exam is performed with the patient in reverse Trendelenburg.  +---------+---------------+---------+-----------+----------+--------------+ RIGHT    CompressibilityPhasicitySpontaneityPropertiesThrombus Aging +---------+---------------+---------+-----------+----------+--------------+ CFV      Full           Yes      Yes                                 +---------+---------------+---------+-----------+----------+--------------+ SFJ      Full                                                        +---------+---------------+---------+-----------+----------+--------------+ FV Prox  Full                                                        +---------+---------------+---------+-----------+----------+--------------+ FV Mid   Full                                                        +---------+---------------+---------+-----------+----------+--------------+ FV DistalFull                                                        +---------+---------------+---------+-----------+----------+--------------+  PFV      Full                                                        +---------+---------------+---------+-----------+----------+--------------+ POP      Full           Yes      Yes                                 +---------+---------------+---------+-----------+----------+--------------+ PTV      Full                                                        +---------+---------------+---------+-----------+----------+--------------+ PERO     Full                                                        +---------+---------------+---------+-----------+----------+--------------+   +---------+---------------+---------+-----------+----------+--------------+ LEFT     CompressibilityPhasicitySpontaneityPropertiesThrombus  Aging +---------+---------------+---------+-----------+----------+--------------+ CFV      Full           Yes      Yes                                 +---------+---------------+---------+-----------+----------+--------------+ SFJ      Full                                                        +---------+---------------+---------+-----------+----------+--------------+ FV Prox  Full                                                        +---------+---------------+---------+-----------+----------+--------------+ FV Mid   Full                                                        +---------+---------------+---------+-----------+----------+--------------+ FV DistalFull                                                        +---------+---------------+---------+-----------+----------+--------------+ PFV      Full                                                        +---------+---------------+---------+-----------+----------+--------------+  POP      Full           Yes      Yes                                 +---------+---------------+---------+-----------+----------+--------------+ PTV      Full                                                        +---------+---------------+---------+-----------+----------+--------------+ PERO     Full                                                        +---------+---------------+---------+-----------+----------+--------------+     Summary: BILATERAL: - No evidence of deep vein thrombosis seen in the lower extremities, bilaterally. - No evidence of superficial venous thrombosis in the lower extremities, bilaterally. -   *See table(s) above for measurements and observations. Electronically signed by Gerarda Fraction on 08/02/2022 at 2:54:52 PM.    Final    DG Chest 1 View  Result Date: 08/01/2022 CLINICAL DATA:  Shortness of breath. EXAM: CHEST  1 VIEW COMPARISON:  Chest radiographs 11/15/2021 and 08/19/2019 FINDINGS:  Cardiac silhouette is again mildly enlarged. Mediastinal contours are within normal limits. The patient is mildly rightward rotated. Mild chronic interstitial thickening is mildly decreased from 11/15/2021 and more similar to 08/19/2019. Mild horizontal linear left basilar subsegmental atelectasis. No pleural effusion or pneumothorax. No acute skeletal abnormality. IMPRESSION: 1. Mild chronic interstitial thickening, mildly decreased from 11/15/2021 and more similar to 08/19/2019. This likely represents chronic interstitial scarring without interstitial pulmonary edema. 2. Mild cardiomegaly. Electronically Signed   By: Neita Garnet M.D.   On: 08/01/2022 13:41     Labs:   Basic Metabolic Panel: Recent Labs  Lab 08/01/22 1308 08/02/22 0450 08/03/22 0204 08/04/22 0207 08/05/22 0149  NA 135 134* 136 136 135  K 3.8 3.4* 3.4* 3.4* 3.8  CL 103 106 106 108 109  CO2 16* 16* 18* 17* 16*  GLUCOSE 102* 90 90 94 91  BUN 37* 35* 38* 42* 47*  CREATININE 4.71* 4.18* 4.39* 4.22* 4.19*  CALCIUM 8.2* 7.6* 7.8* 7.5* 7.8*  MG  --  1.6*  --   --   --   PHOS  --  6.2*  --   --   --    GFR Estimated Creatinine Clearance: 12.7 mL/min (A) (by C-G formula based on SCr of 4.19 mg/dL (H)). Liver Function Tests: Recent Labs  Lab 08/01/22 1905  AST 19  ALT 12  ALKPHOS 65  BILITOT 0.2*  PROT 7.2  ALBUMIN 2.6*   No results for input(s): "LIPASE", "AMYLASE" in the last 168 hours. No results for input(s): "AMMONIA" in the last 168 hours. Coagulation profile No results for input(s): "INR", "PROTIME" in the last 168 hours.  CBC: Recent Labs  Lab 08/01/22 1308 08/02/22 0450 08/03/22 0204 08/04/22 0207 08/05/22 0149  WBC 5.7 4.4 4.9 5.5 5.4  NEUTROABS 3.7  --  3.1 3.5 3.2  HGB 5.6* 7.0* 7.4* 7.1* 7.1*  HCT 18.9* 21.7* 22.6* 22.4* 22.4*  MCV 104.4* 93.1  93.0 94.1 95.3  PLT 395 293 306 297 281   Cardiac Enzymes: No results for input(s): "CKTOTAL", "CKMB", "CKMBINDEX", "TROPONINI" in the last 168  hours. BNP: Invalid input(s): "POCBNP" CBG: No results for input(s): "GLUCAP" in the last 168 hours. D-Dimer No results for input(s): "DDIMER" in the last 72 hours. Hgb A1c No results for input(s): "HGBA1C" in the last 72 hours. Lipid Profile No results for input(s): "CHOL", "HDL", "LDLCALC", "TRIG", "CHOLHDL", "LDLDIRECT" in the last 72 hours. Thyroid function studies No results for input(s): "TSH", "T4TOTAL", "T3FREE", "THYROIDAB" in the last 72 hours.  Invalid input(s): "FREET3" Anemia work up Recent Labs    08/04/22 1023  FERRITIN 39   Microbiology No results found for this or any previous visit (from the past 240 hour(s)).  Time coordinating discharge: 45 minutes  Signed: Kein Carlberg  [facility] 08/05/2022, 11:46 AM

## 2022-08-06 ENCOUNTER — Telehealth: Payer: Self-pay

## 2022-08-06 NOTE — Transitions of Care (Post Inpatient/ED Visit) (Signed)
   08/06/2022  Name: [name] MRN: [id number] DOB: Jul 12, 1960  Today's TOC FU Call Status: Today's TOC FU Call Status:: Unsuccessul Call (1st Attempt) Unsuccessful Call (1st Attempt) Date: 08/06/22  Attempted to reach the patient regarding the most recent Inpatient/ED visit.  Follow Up Plan: Additional outreach attempts will be made to reach the patient to complete the Transitions of Care (Post Inpatient/ED visit) call.   Signature  Robyne Peers, RN

## 2022-08-07 ENCOUNTER — Telehealth: Payer: Self-pay

## 2022-08-07 NOTE — Transitions of Care (Post Inpatient/ED Visit) (Signed)
   08/07/2022  Name: [name] MRN: [id number] DOB: [birth year]  Today's TOC FU Call Status: Today's TOC FU Call Status:: Unsuccessful Call (2nd Attempt) Unsuccessful Call (1st Attempt) Date: 08/06/22 Unsuccessful Call (2nd Attempt) Date: 08/07/22  Attempted to reach the patient regarding the most recent Inpatient/ED visit.  Follow Up Plan: Additional outreach attempts will be made to reach the patient to complete the Transitions of Care (Post Inpatient/ED visit) call.   Signature  Robyne Peers, RN

## 2022-08-08 ENCOUNTER — Telehealth: Payer: Self-pay

## 2022-08-08 ENCOUNTER — Telehealth: Payer: Self-pay | Admitting: Internal Medicine

## 2022-08-08 NOTE — Telephone Encounter (Signed)
Copied from CRM [id number]. Topic: General - Other >> Aug 08, 2022  1:04 PM Ja-Kwan M wrote: Reason for CRM: Pt stated she was returning a missed call she had from the office. Cb# [phone]

## 2022-08-08 NOTE — Transitions of Care (Post Inpatient/ED Visit) (Signed)
08/08/2022  Name: [name] MRN: [id number] DOB: 1960/11/11  Today's TOC FU Call Status: Today's TOC FU Call Status:: Successful TOC FU Call Competed Unsuccessful Call (1st Attempt) Date: 08/06/22 Unsuccessful Call (2nd Attempt) Date: 08/07/22 Unsuccessful Call (3rd Attempt) Date: 08/08/22 [facility] FU Call Complete Date: 08/08/22  Transition Care Management Follow-up Telephone Call Date of Discharge: 08/05/22 Discharge Facility: [facility]) Type of Discharge: Inpatient Admission Primary Inpatient Discharge Diagnosis:: symptomatic anemia How have you been since you were released from the hospital?: Worse (She said she is " all swollen up", her whole body.  She also said she is having a hard time breathing.  She called EMS a couple of nights ago and they gaver her some O2.  She said she is laying on the bed , propped up most of the time.) Any questions or concerns?: Yes Patient Questions/Concerns:: concerned about her breathing as noted above. She does not have a scale but said she knows she is retaining fluid.  She was requesting to be admitted to the hospital and I told her that we are not able to do that, she would need to go to ED.  She said she understood but would go to Urgent Care. Patient Questions/Concerns Addressed: Notified Provider of Patient Questions/Concerns  Items Reviewed: Did you receive and understand the discharge instructions provided?: Yes Medications obtained,verified, and reconciled?: No Medications Not Reviewed Reasons:: Other: (She said she has all of her meds and is taking them as ordered. She has a nebulier. She said she did not want to review the list because she has everything.) Any new allergies since your discharge?: No Dietary orders reviewed?: No Do you have support at home?: Yes People in Home: friend(s) Name of Support/Comfort Primary Source: currently staying in a motel.  Has friends who can provide some assistance.  Medications Reviewed  Today: Medications Reviewed Today     Reviewed by Conni Slipper, CPhT (Pharmacy Technician) on 08/01/22 at 2116  Med List Status: Complete   Medication Order Taking? Sig Documenting Provider Last Dose Status Informant  albuterol (VENTOLIN HFA) 108 (90 Base) MCG/ACT inhaler [id number] Yes INHALE 2 PUFFS INTO THE LUNGS EVERY 6 HOURS AS NEEDED FOR WHEEZING OR SHORTNESS OF BREATH. Claiborne Rigg, NP 08/01/2022 Active Self  amLODipine (NORVASC) 5 MG tablet [id number] Yes Take 1 tablet (5 mg total) by mouth daily. Claiborne Rigg, NP 08/01/2022 Active Self  busPIRone (BUSPAR) 10 MG tablet [id number] Yes Take 1 tablet (10 mg total) by mouth 2 (two) times daily. Claiborne Rigg, NP 08/01/2022 Active Self  Fluticasone-Umeclidin-Vilant (TRELEGY ELLIPTA) 100-62.5-25 MCG/ACT AEPB [id number] Yes Inhale 1 puff into the lungs daily. Claiborne Rigg, NP 08/01/2022 Active Self  furosemide (LASIX) 40 MG tablet [id number] Yes Take 1 tablet (40 mg total) by mouth daily as needed for edema or fluid (as needed for leg swelling). Anders Simmonds, [place] 08/01/2022 Active Self  gabapentin (NEURONTIN) 100 MG capsule [id number] Yes Take 1 capsule (100 mg total) by mouth 3 (three) times daily. Claiborne Rigg, NP 08/01/2022 Active Self  ipratropium-albuterol (DUONEB) 0.5-2.5 (3) MG/3ML SOLN [id number] Yes Take 3 mLs by nebulization every 6 (six) hours as needed (shortness of breath and wheezing). Claiborne Rigg, NP 07/31/2022 Active Self  Multiple Vitamin (MULTIVITAMIN WITH MINERALS) TABS tablet [id number] Yes Take 1 tablet by mouth daily. [name], MD 08/01/2022 Active Self  mupirocin ointment (BACTROBAN) 2 % [id number] No Apply 1 Application topically 2 (two) times daily.  Patient not taking:  Reported on 08/01/2022   Anders Simmonds, PA-C Completed Course Active Self  pantoprazole (PROTONIX) 40 MG tablet [id number] Yes Take 1 tablet (40 mg total) by mouth daily. Anders Simmonds, [place] 08/01/2022 Active Self  sodium  bicarbonate 650 MG tablet [id number] Yes Take 2 tablets (1,300 mg total) by mouth 3 (three) times daily.  08/01/2022 Active Self  traZODone (DESYREL) 50 MG tablet [id number] Yes Take 1 tablet (50 mg total) by mouth at bedtime as needed for sleep. Claiborne Rigg, NP 07/31/2022 Active Self            Home Care and Equipment/Supplies: Were Home Health Services Ordered?: No Any new equipment or medical supplies ordered?: No  Functional Questionnaire: Do you need assistance with bathing/showering or dressing?: No Do you need assistance with meal preparation?: No Do you need assistance with eating?: No Do you have difficulty maintaining continence: No Do you need assistance with getting out of bed/getting out of a chair/moving?: Yes (She said she could use a cane.) Do you have difficulty managing or taking your medications?: No  Follow up appointments reviewed: PCP Follow-up appointment confirmed?: Yes Date of PCP follow-up appointment?: 08/28/22 Follow-up Provider: Corene Cornea, PA Specialist Hospital Follow-up appointment confirmed?: NA Do you need transportation to your follow-up appointment?: No Do you understand care options if your condition(s) worsen?: Yes-patient verbalized understanding    SIGNATURE  Robyne Peers, RN

## 2022-08-08 NOTE — Telephone Encounter (Signed)
From the discharge call:  She stated that she is not feeling well.  She said she is " all swollen up", her whole body.  She also said she is having a hard time breathing.  She called EMS a couple of nights ago and they gaver her some O2.  She said she is laying on the bed , propped up most of the time.   She does not have a scale but said she knows she is retaining fluid.  She was requesting to be admitted to the hospital and I told her that we are not able to do that, she would need to go to ED.  She said she understood but would go to Urgent Care.  She said she has all of her meds and is taking them as ordered. She has a nebulier. She said she did not want to review the list because she has everything  She is  currently staying in a motel.  Has friends who can provide some assistance.    Follow-up Provider: Corene Cornea, PA - 08/28/2022.

## 2022-08-08 NOTE — Telephone Encounter (Signed)
Call returned to patient and documented in another telephone encounter from today

## 2022-08-08 NOTE — Transitions of Care (Post Inpatient/ED Visit) (Signed)
   08/08/2022  Name: [name] MRN: [id number] DOB: 11-Sep-1960  Today's TOC FU Call Status: Today's TOC FU Call Status:: Unsuccessful Call (3rd Attempt) Unsuccessful Call (1st Attempt) Date: 08/06/22 Unsuccessful Call (2nd Attempt) Date: 08/07/22 Unsuccessful Call (3rd Attempt) Date: 08/08/22  Attempted to reach the patient regarding the most recent Inpatient/ED visit.  Follow Up Plan: No further outreach attempts will be made at this time. We have been unable to contact the patient.   She has an appointment with Dr Laural Benes at [facility] on 09/12/2022.  If she calls back, we can try to schedule her with another provider to be seen sooner.   Signature  Audie Box

## 2022-08-12 ENCOUNTER — Emergency Department (HOSPITAL_COMMUNITY): Payer: Medicare Other

## 2022-08-12 ENCOUNTER — Inpatient Hospital Stay (HOSPITAL_COMMUNITY)
Admission: EM | Admit: 2022-08-12 | Discharge: 2022-08-20 | DRG: 264 | Disposition: A | Discharge status: Home / Self Care | Payer: Medicare Other | Attending: Internal Medicine | Admitting: Internal Medicine

## 2022-08-12 ENCOUNTER — Encounter (HOSPITAL_COMMUNITY): Payer: Self-pay

## 2022-08-12 ENCOUNTER — Other Ambulatory Visit: Payer: Self-pay

## 2022-08-12 DIAGNOSIS — E876 Hypokalemia: Secondary | ICD-10-CM | POA: Diagnosis present

## 2022-08-12 DIAGNOSIS — B182 Chronic viral hepatitis C: Secondary | ICD-10-CM | POA: Diagnosis present

## 2022-08-12 DIAGNOSIS — Z1152 Encounter for screening for COVID-19: Secondary | ICD-10-CM

## 2022-08-12 DIAGNOSIS — J441 Chronic obstructive pulmonary disease with (acute) exacerbation: Secondary | ICD-10-CM | POA: Diagnosis present

## 2022-08-12 DIAGNOSIS — Z992 Dependence on renal dialysis: Secondary | ICD-10-CM

## 2022-08-12 DIAGNOSIS — E872 Acidosis, unspecified: Secondary | ICD-10-CM | POA: Diagnosis present

## 2022-08-12 DIAGNOSIS — I132 Hypertensive heart and chronic kidney disease with heart failure and with stage 5 chronic kidney disease, or end stage renal disease: Principal | ICD-10-CM | POA: Diagnosis present

## 2022-08-12 DIAGNOSIS — J90 Pleural effusion, not elsewhere classified: Secondary | ICD-10-CM

## 2022-08-12 DIAGNOSIS — R195 Other fecal abnormalities: Secondary | ICD-10-CM

## 2022-08-12 DIAGNOSIS — F1910 Other psychoactive substance abuse, uncomplicated: Secondary | ICD-10-CM

## 2022-08-12 DIAGNOSIS — D509 Iron deficiency anemia, unspecified: Secondary | ICD-10-CM

## 2022-08-12 DIAGNOSIS — K219 Gastro-esophageal reflux disease without esophagitis: Secondary | ICD-10-CM

## 2022-08-12 DIAGNOSIS — D649 Anemia, unspecified: Secondary | ICD-10-CM

## 2022-08-12 DIAGNOSIS — J449 Chronic obstructive pulmonary disease, unspecified: Secondary | ICD-10-CM

## 2022-08-12 DIAGNOSIS — K2990 Gastroduodenitis, unspecified, without bleeding: Secondary | ICD-10-CM

## 2022-08-12 DIAGNOSIS — K5521 Angiodysplasia of colon with hemorrhage: Secondary | ICD-10-CM | POA: Diagnosis present

## 2022-08-12 DIAGNOSIS — K449 Diaphragmatic hernia without obstruction or gangrene: Secondary | ICD-10-CM | POA: Diagnosis present

## 2022-08-12 DIAGNOSIS — E1122 Type 2 diabetes mellitus with diabetic chronic kidney disease: Secondary | ICD-10-CM | POA: Diagnosis present

## 2022-08-12 DIAGNOSIS — N179 Acute kidney failure, unspecified: Secondary | ICD-10-CM

## 2022-08-12 DIAGNOSIS — E871 Hypo-osmolality and hyponatremia: Secondary | ICD-10-CM | POA: Diagnosis present

## 2022-08-12 DIAGNOSIS — Z638 Other specified problems related to primary support group: Secondary | ICD-10-CM

## 2022-08-12 DIAGNOSIS — J9601 Acute respiratory failure with hypoxia: Secondary | ICD-10-CM

## 2022-08-12 DIAGNOSIS — R0602 Shortness of breath: Secondary | ICD-10-CM | POA: Diagnosis not present

## 2022-08-12 DIAGNOSIS — Z7951 Long term (current) use of inhaled steroids: Secondary | ICD-10-CM

## 2022-08-12 DIAGNOSIS — I10 Essential (primary) hypertension: Secondary | ICD-10-CM

## 2022-08-12 DIAGNOSIS — I5033 Acute on chronic diastolic (congestive) heart failure: Secondary | ICD-10-CM | POA: Diagnosis present

## 2022-08-12 DIAGNOSIS — F419 Anxiety disorder, unspecified: Secondary | ICD-10-CM | POA: Diagnosis not present

## 2022-08-12 DIAGNOSIS — R627 Adult failure to thrive: Secondary | ICD-10-CM | POA: Diagnosis present

## 2022-08-12 DIAGNOSIS — N390 Urinary tract infection, site not specified: Secondary | ICD-10-CM | POA: Diagnosis not present

## 2022-08-12 DIAGNOSIS — J189 Pneumonia, unspecified organism: Secondary | ICD-10-CM | POA: Diagnosis not present

## 2022-08-12 DIAGNOSIS — E8809 Other disorders of plasma-protein metabolism, not elsewhere classified: Secondary | ICD-10-CM | POA: Diagnosis present

## 2022-08-12 DIAGNOSIS — N186 End stage renal disease: Secondary | ICD-10-CM | POA: Diagnosis present

## 2022-08-12 DIAGNOSIS — M79604 Pain in right leg: Secondary | ICD-10-CM

## 2022-08-12 DIAGNOSIS — Z1612 Extended spectrum beta lactamase (ESBL) resistance: Secondary | ICD-10-CM | POA: Diagnosis not present

## 2022-08-12 DIAGNOSIS — E785 Hyperlipidemia, unspecified: Secondary | ICD-10-CM | POA: Diagnosis present

## 2022-08-12 DIAGNOSIS — Z66 Do not resuscitate: Secondary | ICD-10-CM | POA: Diagnosis not present

## 2022-08-12 DIAGNOSIS — Z888 Allergy status to other drugs, medicaments and biological substances status: Secondary | ICD-10-CM

## 2022-08-12 DIAGNOSIS — Z789 Other specified health status: Secondary | ICD-10-CM | POA: Insufficient documentation

## 2022-08-12 DIAGNOSIS — B1920 Unspecified viral hepatitis C without hepatic coma: Secondary | ICD-10-CM | POA: Diagnosis present

## 2022-08-12 DIAGNOSIS — E1140 Type 2 diabetes mellitus with diabetic neuropathy, unspecified: Secondary | ICD-10-CM | POA: Diagnosis present

## 2022-08-12 DIAGNOSIS — F32A Depression, unspecified: Secondary | ICD-10-CM

## 2022-08-12 DIAGNOSIS — G4733 Obstructive sleep apnea (adult) (pediatric): Secondary | ICD-10-CM | POA: Diagnosis present

## 2022-08-12 DIAGNOSIS — K279 Peptic ulcer, site unspecified, unspecified as acute or chronic, without hemorrhage or perforation: Secondary | ICD-10-CM

## 2022-08-12 DIAGNOSIS — D631 Anemia in chronic kidney disease: Secondary | ICD-10-CM | POA: Diagnosis present

## 2022-08-12 DIAGNOSIS — Z79899 Other long term (current) drug therapy: Secondary | ICD-10-CM

## 2022-08-12 DIAGNOSIS — K2970 Gastritis, unspecified, without bleeding: Secondary | ICD-10-CM

## 2022-08-12 DIAGNOSIS — R0902 Hypoxemia: Principal | ICD-10-CM

## 2022-08-12 DIAGNOSIS — Z91199 Patient's noncompliance with other medical treatment and regimen due to unspecified reason: Secondary | ICD-10-CM

## 2022-08-12 DIAGNOSIS — Z597 Insufficient social insurance and welfare support: Secondary | ICD-10-CM

## 2022-08-12 DIAGNOSIS — N185 Chronic kidney disease, stage 5: Secondary | ICD-10-CM

## 2022-08-12 DIAGNOSIS — J440 Chronic obstructive pulmonary disease with acute lower respiratory infection: Secondary | ICD-10-CM | POA: Diagnosis not present

## 2022-08-12 DIAGNOSIS — B961 Klebsiella pneumoniae [K. pneumoniae] as the cause of diseases classified elsewhere: Secondary | ICD-10-CM | POA: Diagnosis not present

## 2022-08-12 DIAGNOSIS — I509 Heart failure, unspecified: Secondary | ICD-10-CM

## 2022-08-12 DIAGNOSIS — Z833 Family history of diabetes mellitus: Secondary | ICD-10-CM

## 2022-08-12 DIAGNOSIS — M79605 Pain in left leg: Secondary | ICD-10-CM

## 2022-08-12 DIAGNOSIS — F17210 Nicotine dependence, cigarettes, uncomplicated: Secondary | ICD-10-CM | POA: Diagnosis present

## 2022-08-12 DIAGNOSIS — K31811 Angiodysplasia of stomach and duodenum with bleeding: Secondary | ICD-10-CM | POA: Diagnosis present

## 2022-08-12 DIAGNOSIS — D62 Acute posthemorrhagic anemia: Secondary | ICD-10-CM | POA: Diagnosis present

## 2022-08-12 DIAGNOSIS — Z5901 Sheltered homelessness: Secondary | ICD-10-CM

## 2022-08-12 DIAGNOSIS — G8929 Other chronic pain: Secondary | ICD-10-CM

## 2022-08-12 HISTORY — DX: Acute on chronic diastolic (congestive) heart failure: I50.33

## 2022-08-12 LAB — COMPREHENSIVE METABOLIC PANEL
ALT: 11 U/L (ref 0–44)
AST: 16 U/L (ref 15–41)
Albumin: 2.2 g/dL — ABNORMAL LOW (ref 3.5–5.0)
Alkaline Phosphatase: 77 U/L (ref 38–126)
Anion gap: 15 (ref 5–15)
BUN: 58 mg/dL — ABNORMAL HIGH (ref 8–23)
CO2: 14 mmol/L — ABNORMAL LOW (ref 22–32)
Calcium: 7.7 mg/dL — ABNORMAL LOW (ref 8.9–10.3)
Chloride: 103 mmol/L (ref 98–111)
Creatinine, Ser: 4.68 mg/dL — ABNORMAL HIGH (ref 0.44–1.00)
GFR, Estimated: 10 mL/min — ABNORMAL LOW (ref >60)
Glucose, Bld: 136 mg/dL — ABNORMAL HIGH (ref 70–99)
Potassium: 3.4 mmol/L — ABNORMAL LOW (ref 3.5–5.1)
Sodium: 132 mmol/L — ABNORMAL LOW (ref 135–145)
Total Bilirubin: 0.6 mg/dL (ref 0.3–1.2)
Total Protein: 7.1 g/dL (ref 6.5–8.1)

## 2022-08-12 LAB — CBC WITH DIFFERENTIAL/PLATELET
Abs Immature Granulocytes: 0.14 10*3/uL — ABNORMAL HIGH (ref 0.00–0.07)
Basophils Absolute: 0.1 10*3/uL (ref 0.0–0.1)
Basophils Relative: 1 %
Eosinophils Absolute: 0.4 10*3/uL (ref 0.0–0.5)
Eosinophils Relative: 5 %
HCT: 22 % — ABNORMAL LOW (ref 36.0–46.0)
Hemoglobin: 7 g/dL — ABNORMAL LOW (ref 12.0–15.0)
Immature Granulocytes: 2 %
Lymphocytes Relative: 15 %
Lymphs Abs: 1.1 10*3/uL (ref 0.7–4.0)
MCH: 31 pg (ref 26.0–34.0)
MCHC: 31.8 g/dL (ref 30.0–36.0)
MCV: 97.3 fL (ref 80.0–100.0)
Monocytes Absolute: 0.6 10*3/uL (ref 0.1–1.0)
Monocytes Relative: 8 %
Neutro Abs: 5 10*3/uL (ref 1.7–7.7)
Neutrophils Relative %: 69 %
Platelets: 371 10*3/uL (ref 150–400)
RBC: 2.26 MIL/uL — ABNORMAL LOW (ref 3.87–5.11)
RDW: 17.1 % — ABNORMAL HIGH (ref 11.5–15.5)
WBC: 7.3 10*3/uL (ref 4.0–10.5)
nRBC: 0.4 % — ABNORMAL HIGH (ref 0.0–0.2)

## 2022-08-12 LAB — LACTIC ACID, PLASMA
Lactic Acid, Venous: 2.1 mmol/L (ref 0.5–1.9)
Lactic Acid, Venous: 0.8 mmol/L (ref 0.5–1.9)

## 2022-08-12 LAB — PROTIME-INR
INR: 1.1 (ref 0.8–1.2)
Prothrombin Time: 14.1 seconds (ref 11.4–15.2)

## 2022-08-12 LAB — MAGNESIUM: Magnesium: 1.9 mg/dL (ref 1.7–2.4)

## 2022-08-12 LAB — BRAIN NATRIURETIC PEPTIDE: B Natriuretic Peptide: 1084.9 pg/mL — ABNORMAL HIGH (ref 0.0–100.0)

## 2022-08-12 MED ORDER — FUROSEMIDE 10 MG/ML IJ SOLN
60.0000 mg | Freq: Once | INTRAMUSCULAR | Status: AC
  Administered 2022-08-12: 60 mg via INTRAVENOUS
  Filled 2022-08-12: qty 6

## 2022-08-12 MED ORDER — UMECLIDINIUM BROMIDE 62.5 MCG/ACT IN AEPB
1.0000 | INHALATION_SPRAY | Freq: Every day | RESPIRATORY_TRACT | Status: DC
  Administered 2022-08-14 – 2022-08-15 (×2): 1 via RESPIRATORY_TRACT
  Filled 2022-08-12: qty 7

## 2022-08-12 MED ORDER — METHYLPREDNISOLONE SODIUM SUCC 125 MG IJ SOLR
125.0000 mg | INTRAMUSCULAR | Status: AC
  Administered 2022-08-12: 125 mg via INTRAVENOUS
  Filled 2022-08-12: qty 2

## 2022-08-12 MED ORDER — GABAPENTIN 100 MG PO CAPS
100.0000 mg | ORAL_CAPSULE | Freq: Three times a day (TID) | ORAL | Status: DC
  Administered 2022-08-12 – 2022-08-20 (×22): 100 mg via ORAL
  Filled 2022-08-12 (×22): qty 1

## 2022-08-12 MED ORDER — POLYETHYLENE GLYCOL 3350 17 G PO PACK: 17.0000 g | PACK | Freq: Every day | ORAL | Status: DC | PRN

## 2022-08-12 MED ORDER — HEPARIN SODIUM (PORCINE) 5000 UNIT/ML IJ SOLN
5000.0000 [IU] | Freq: Three times a day (TID) | INTRAMUSCULAR | Status: DC
  Administered 2022-08-12 – 2022-08-13 (×2): 5000 [IU] via SUBCUTANEOUS
  Filled 2022-08-12 (×2): qty 1

## 2022-08-12 MED ORDER — FUROSEMIDE 10 MG/ML IJ SOLN: 60.0000 mg | Freq: Two times a day (BID) | INTRAMUSCULAR | Status: DC

## 2022-08-12 MED ORDER — IPRATROPIUM-ALBUTEROL 0.5-2.5 (3) MG/3ML IN SOLN
3.0000 mL | RESPIRATORY_TRACT | Status: AC
  Administered 2022-08-12 (×3): 3 mL via RESPIRATORY_TRACT
  Filled 2022-08-12 (×3): qty 3

## 2022-08-12 MED ORDER — FUROSEMIDE 10 MG/ML IJ SOLN
80.0000 mg | Freq: Two times a day (BID) | INTRAMUSCULAR | Status: DC
  Administered 2022-08-13 – 2022-08-18 (×10): 80 mg via INTRAVENOUS
  Filled 2022-08-12 (×11): qty 8

## 2022-08-12 MED ORDER — TRAZODONE HCL 50 MG PO TABS
50.0000 mg | ORAL_TABLET | Freq: Every evening | ORAL | Status: DC | PRN
  Administered 2022-08-12 – 2022-08-19 (×8): 50 mg via ORAL
  Filled 2022-08-12 (×8): qty 1

## 2022-08-12 MED ORDER — BUSPIRONE HCL 10 MG PO TABS
10.0000 mg | ORAL_TABLET | Freq: Two times a day (BID) | ORAL | Status: DC
  Administered 2022-08-12 – 2022-08-17 (×10): 10 mg via ORAL
  Filled 2022-08-12 (×10): qty 1

## 2022-08-12 MED ORDER — SODIUM CHLORIDE 0.9% FLUSH
3.0000 mL | Freq: Two times a day (BID) | INTRAVENOUS | Status: DC
  Administered 2022-08-12 – 2022-08-20 (×16): 3 mL via INTRAVENOUS

## 2022-08-12 MED ORDER — ACETAMINOPHEN 325 MG PO TABS
650.0000 mg | ORAL_TABLET | Freq: Four times a day (QID) | ORAL | Status: DC | PRN
  Administered 2022-08-12 – 2022-08-19 (×8): 650 mg via ORAL
  Filled 2022-08-12 (×8): qty 2

## 2022-08-12 MED ORDER — ALBUTEROL SULFATE (2.5 MG/3ML) 0.083% IN NEBU: 3.0000 mL | INHALATION_SOLUTION | Freq: Four times a day (QID) | RESPIRATORY_TRACT | Status: DC | PRN

## 2022-08-12 MED ORDER — POTASSIUM CHLORIDE CRYS ER 20 MEQ PO TBCR
60.0000 meq | EXTENDED_RELEASE_TABLET | Freq: Once | ORAL | Status: AC
  Administered 2022-08-12: 60 meq via ORAL
  Filled 2022-08-12: qty 3

## 2022-08-12 MED ORDER — SODIUM BICARBONATE 650 MG PO TABS
1300.0000 mg | ORAL_TABLET | Freq: Three times a day (TID) | ORAL | Status: DC
  Administered 2022-08-12 – 2022-08-16 (×12): 1300 mg via ORAL
  Filled 2022-08-12 (×11): qty 2

## 2022-08-12 MED ORDER — ACETAMINOPHEN 650 MG RE SUPP: 650.0000 mg | Freq: Four times a day (QID) | RECTAL | Status: DC | PRN

## 2022-08-12 MED ORDER — FERROUS SULFATE 325 (65 FE) MG PO TABS
325.0000 mg | ORAL_TABLET | Freq: Every day | ORAL | Status: DC
  Administered 2022-08-13 – 2022-08-20 (×8): 325 mg via ORAL
  Filled 2022-08-12 (×8): qty 1

## 2022-08-12 MED ORDER — PANTOPRAZOLE SODIUM 40 MG PO TBEC
40.0000 mg | DELAYED_RELEASE_TABLET | Freq: Every day | ORAL | Status: DC
  Administered 2022-08-13 – 2022-08-15 (×3): 40 mg via ORAL
  Filled 2022-08-12 (×3): qty 1

## 2022-08-12 MED ORDER — FLUTICASONE FUROATE-VILANTEROL 100-25 MCG/ACT IN AEPB
1.0000 | INHALATION_SPRAY | Freq: Every day | RESPIRATORY_TRACT | Status: DC
  Administered 2022-08-14 – 2022-08-15 (×2): 1 via RESPIRATORY_TRACT
  Filled 2022-08-12: qty 28

## 2022-08-12 MED ORDER — IPRATROPIUM BROMIDE 0.02 % IN SOLN
0.5000 mg | Freq: Four times a day (QID) | RESPIRATORY_TRACT | Status: DC
  Administered 2022-08-12: 0.5 mg via RESPIRATORY_TRACT
  Filled 2022-08-12: qty 2.5

## 2022-08-12 NOTE — ED Notes (Signed)
ED TO INPATIENT HANDOFF REPORT  ED Nurse Name and Phone #: [phone]  S Name/Age/Gender Rudene Christians Devin 62 y.o. female Room/Bed: 017C/017C  Code Status   Code Status: Full Code  Home/SNF/Other Home Patient oriented to: self, place, time, and situation Is this baseline? Yes   Triage Complete: Triage complete  Chief Complaint Acute on chronic diastolic CHF (congestive heart failure) (HCC) [I50.33]  Triage Note Patient reports increased fluid retention and sob.  Initial sats were in the 80's.  Patient has anasarca in legs arms and abd.    Allergies Allergies  Allergen Reactions   Zestril [Lisinopril] Other (See Comments)    Hyperkalemia    Level of Care/Admitting Diagnosis ED Disposition     ED Disposition  Admit   Condition  --   Comment  Hospital Area: [facility] [[id number]]  Level of Care: Telemetry Cardiac [[id number]]  May place patient in observation at [facility] or [facility] if equivalent level of care is available:: No  Covid Evaluation: Asymptomatic - no recent exposure (last 10 days) testing not required  Diagnosis: Acute on chronic diastolic CHF (congestive heart failure) [facility]) [[id number]]  Admitting Physician: Synetta Fail [[id number]]  Attending Physician: Synetta Fail [phone]          B Medical/Surgery History Past Medical History:  Diagnosis Date   Alcohol abuse    Allergy    Anxiety    Arthritis    Asthma    Cannabis abuse    Cocaine abuse (HCC)    COPD (chronic obstructive pulmonary disease) (HCC)    Depression    Drug addiction (HCC)    GERD (gastroesophageal reflux disease)    Heart murmur    Homelessness    Hyperlipidemia    Hypertension    pt stated "every once in a while BP will be high but has not been prescribed medication for HTN.    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)  02/22/2016   Past Surgical History:  Procedure Laterality Date   BIOPSY  01/01/2019   Procedure: BIOPSY;  Surgeon: Beverley Fiedler, MD;  Location: [facility] ENDOSCOPY;  Service: Endoscopy;;   BIOPSY  11/17/2021   Procedure: BIOPSY;  Surgeon: Lynann Bologna, MD;  Location: [facility] ENDOSCOPY;  Service: Gastroenterology;;   CESAREAN SECTION  1989   COLONOSCOPY  11/07/2020   2018   CYSTOSCOPY W/ URETERAL STENT PLACEMENT Left 08/01/2018   Procedure: CYSTOSCOPY WITH RETROGRADE PYELOGRAM/URETERAL STENT PLACEMENT;  Surgeon: Crista Elliot, MD;  Location: [facility] ORS;  Service: Urology;  Laterality: Left;   CYSTOSCOPY WITH RETROGRADE PYELOGRAM, URETEROSCOPY AND STENT PLACEMENT Left 01/15/2019   Procedure: CYSTOSCOPY WITH RETROGRADE PYELOGRAM, URETEROSCOPY AND STENT PLACEMENT;  Surgeon: Crista Elliot, MD;  Location: [facility] ORS;  Service: Urology;  Laterality: Left;   ESOPHAGOGASTRODUODENOSCOPY (EGD) WITH PROPOFOL N/A 01/01/2019   Procedure: ESOPHAGOGASTRODUODENOSCOPY (EGD) WITH PROPOFOL;  Surgeon: Beverley Fiedler, MD;  Location: [facility] ENDOSCOPY;  Service: Endoscopy;  Laterality: N/A;   ESOPHAGOGASTRODUODENOSCOPY (EGD) WITH PROPOFOL N/A 08/21/2019   Procedure: ESOPHAGOGASTRODUODENOSCOPY (EGD) WITH PROPOFOL;  Surgeon: Napoleon Form, MD;  Location: [facility] ENDOSCOPY;  Service: Endoscopy;  Laterality: N/A;   ESOPHAGOGASTRODUODENOSCOPY (EGD) WITH PROPOFOL N/A 11/17/2021   Procedure: ESOPHAGOGASTRODUODENOSCOPY (EGD) WITH PROPOFOL;  Surgeon: Lynann Bologna, MD;  Location: [facility] ENDOSCOPY;  Service: Gastroenterology;  Laterality: N/A;   FRACTURE  SURGERY Left 2011   arm   UPPER GASTROINTESTINAL ENDOSCOPY       A IV Location/Drains/Wounds Patient Lines/Drains/Airways Status     Active Line/Drains/Airways     Name Placement date Placement time Site Days   Peripheral IV 08/12/22 20 G Anterior;Left;Proximal Forearm 08/12/22  1723  Forearm  less than 1            Intake/Output Last 24 hours No intake or output data in the 24  hours ending 08/12/22 1851  Labs/Imaging Results for orders placed or performed during the hospital encounter of 08/12/22 (from the past 48 hour(s))  Comprehensive metabolic panel     Status: Abnormal   Collection Time: 08/12/22  5:08 PM  Result Value Ref Range   Sodium 132 (L) 135 - 145 mmol/L   Potassium 3.4 (L) 3.5 - 5.1 mmol/L   Chloride 103 98 - 111 mmol/L   CO2 14 (L) 22 - 32 mmol/L   Glucose, Bld 136 (H) 70 - 99 mg/dL    Comment: Glucose reference range applies only to samples taken after fasting for at least 8 hours.   BUN 58 (H) 8 - 23 mg/dL   Creatinine, Ser 1.61 (H) 0.44 - 1.00 mg/dL   Calcium 7.7 (L) 8.9 - 10.3 mg/dL   Total Protein 7.1 6.5 - 8.1 g/dL   Albumin 2.2 (L) 3.5 - 5.0 g/dL   AST 16 15 - 41 U/L   ALT 11 0 - 44 U/L   Alkaline Phosphatase 77 38 - 126 U/L   Total Bilirubin 0.6 0.3 - 1.2 mg/dL   GFR, Estimated 10 (L) >60 mL/min    Comment: (NOTE) Calculated using the CKD-EPI Creatinine Equation (2021)    Anion gap 15 5 - 15    Comment: Performed at [facility], [address] [place], [place] [address]  CBC with Differential     Status: Abnormal   Collection Time: 08/12/22  5:08 PM  Result Value Ref Range   WBC 7.3 4.0 - 10.5 K/uL   RBC 2.26 (L) 3.87 - 5.11 MIL/uL   Hemoglobin 7.0 (L) 12.0 - 15.0 g/dL    Comment: REPEATED TO VERIFY   HCT 22.0 (L) 36.0 - 46.0 %   MCV 97.3 80.0 - 100.0 fL   MCH 31.0 26.0 - 34.0 pg   MCHC 31.8 30.0 - 36.0 g/dL   RDW 54.0 (H) 98.1 - 19.1 %   Platelets 371 150 - 400 K/uL    Comment: REPEATED TO VERIFY   nRBC 0.4 (H) 0.0 - 0.2 %   Neutrophils Relative % 69 %   Neutro Abs 5.0 1.7 - 7.7 K/uL   Lymphocytes Relative 15 %   Lymphs Abs 1.1 0.7 - 4.0 K/uL   Monocytes Relative 8 %   Monocytes Absolute 0.6 0.1 - 1.0 K/uL   Eosinophils Relative 5 %   Eosinophils Absolute 0.4 0.0 - 0.5 K/uL   Basophils Relative 1 %   Basophils Absolute 0.1 0.0 - 0.1 K/uL   Immature Granulocytes 2 %   Abs Immature Granulocytes 0.14 (H)  0.00 - 0.07 K/uL    Comment: Performed at [facility], [address] [place], [place] [address]  Brain natriuretic peptide     Status: Abnormal   Collection Time: 08/12/22  5:10 PM  Result Value Ref Range   B Natriuretic Peptide 1,084.9 (H) 0.0 - 100.0 pg/mL    Comment: Performed at [facility], [address], [place],  [place] [address]  Lactic acid, plasma     Status: Abnormal   Collection Time: 08/12/22  5:20 PM  Result Value Ref Range   Lactic Acid, Venous 2.1 (HH) 0.5 - 1.9 mmol/L    Comment: CRITICAL RESULT CALLED TO, READ BACK BY AND VERIFIED WITH E Rhayne Chatwin,RN 1825 08/12/2022 WBOND Performed at [facility], [address], [place], [place] [address]    DG Chest 2 View  Result Date: 08/12/2022 CLINICAL DATA:  Suspected sepsis, fluid retention, shortness of breath, anasarca, COPD, emphysema EXAM: CHEST - 2 VIEW COMPARISON:  08/01/2022 FINDINGS: Cardiomegaly. Diffuse bilateral interstitial pulmonary opacity and right-greater-than-left small pleural effusions. Disc degenerative disease of the thoracic spine. IMPRESSION: Cardiomegaly with diffuse bilateral interstitial pulmonary opacity and right-greater-than-left small pleural effusions, consistent with edema. No focal airspace opacity. Electronically Signed   By: Jearld Lesch M.D.   On: 08/12/2022 18:12    Pending Labs Unresulted Labs (From admission, onward)     Start     Ordered   08/13/22 0500  Comprehensive metabolic panel  Tomorrow morning,   R        08/12/22 1847   08/13/22 0500  CBC  Tomorrow morning,   R        08/12/22 1847   08/12/22 1846  Magnesium  Add-on,   AD        08/12/22 1847   08/12/22 1800  Protime-INR  Once,   STAT        08/12/22 1800   08/12/22 1708  Lactic acid, plasma  Now then every 2 hours,   R (with STAT occurrences)      08/12/22 1707   08/12/22 1708  Culture, blood (Routine x 2)  BLOOD CULTURE X 2,   R (with STAT occurrences)      08/12/22 1707   08/12/22 1708  Urinalysis,  w/ Reflex to Culture (Infection Suspected) -Urine, Clean Catch  Once,   URGENT       Question:  Specimen Source  Answer:  Urine, Clean Catch   08/12/22 1707            Vitals/Pain Today's Vitals   08/12/22 1708 08/12/22 1710 08/12/22 1800 08/12/22 1815  BP: (!) 172/75  138/68 139/69  Pulse: (!) 102  97 76  Resp: (!) 23  16 14   Temp:      SpO2: 91%  95% 94%  Weight:  53.5 kg    Height:  5\' 7"  (1.702 m)    PainSc:        Isolation Precautions No active isolations  Medications Medications  busPIRone (BUSPAR) tablet 10 mg (has no administration in time range)  traZODone (DESYREL) tablet 50 mg (has no administration in time range)  pantoprazole (PROTONIX) EC tablet 40 mg (has no administration in time range)  sodium bicarbonate tablet 1,300 mg (has no administration in time range)  ferrous sulfate tablet 325 mg (has no administration in time range)  fluticasone furoate-vilanterol (BREO ELLIPTA) 100-25 MCG/ACT 1 puff (has no administration in time range)    And  umeclidinium bromide (INCRUSE ELLIPTA) 62.5 MCG/ACT 1 puff (has no administration in time range)  albuterol (VENTOLIN HFA) 108 (90 Base) MCG/ACT inhaler 2 puff (has no administration in time range)  heparin injection 5,000 Units (has no administration in time range)  furosemide (LASIX) injection 60 mg (has no administration in time range)  sodium chloride flush (NS) 0.9 % injection 3 mL (has no administration in time range)  acetaminophen (TYLENOL) tablet 650 mg (  has no administration in time range)    Or  acetaminophen (TYLENOL) suppository 650 mg (has no administration in time range)  polyethylene glycol (MIRALAX / GLYCOLAX) packet 17 g (has no administration in time range)  ipratropium (ATROVENT) nebulizer solution 0.5 mg (has no administration in time range)  methylPREDNISolone sodium succinate (SOLU-MEDROL) 125 mg/2 mL injection 125 mg (125 mg Intravenous Given 08/12/22 1759)  ipratropium-albuterol (DUONEB) 0.5-2.5  (3) MG/3ML nebulizer solution 3 mL (3 mLs Nebulization Given 08/12/22 1840)  furosemide (LASIX) injection 60 mg (60 mg Intravenous Given 08/12/22 1838)    Mobility walks     Focused Assessments Pulmonary Assessment Handoff:  Lung sounds: Bilateral Breath Sounds: Expiratory wheezes L Breath Sounds: Diminished R Breath Sounds: Diminished O2 Device: Nasal Cannula O2 Flow Rate (L/min): 2 L/min    R Recommendations: See Admitting Provider Note  Report given to:   Additional Notes:

## 2022-08-12 NOTE — ED Notes (Signed)
Patient transported to floor at this time via transport

## 2022-08-12 NOTE — ED Triage Notes (Signed)
Patient reports increased fluid retention and sob.  Initial sats were in the 80's.  Patient has anasarca in legs arms and abd.

## 2022-08-12 NOTE — ED Provider Notes (Signed)
[place] [facility]ENC[facility] DEPARTMENT AT [facility] Provider Note   CSN: [id number] Arrival date & time: 08/12/22  1645     History {Add pertinent medical, surgical, social history, OB history to HPI:1} Chief Complaint  Patient presents with   Shortness of Breath    Leinani M Hoecker is a 62 y.o. female.  Shortness of breath since discharged home.  Feels like fluid has been building up.  Has had a cough that is occasionally productive.  No fevers that she knows of.  Still smokes.  Used IV drugs in the past but just marijuana occasionally now.  Past Medical History: No date: Alcohol abuse No date: Allergy No date: Anxiety No date: Arthritis No date: Asthma No date: Cannabis abuse No date: Cocaine abuse (HCC) No date: COPD (chronic obstructive pulmonary disease) (HCC) No date: Depression No date: Drug addiction (HCC) No date: GERD (gastroesophageal reflux disease) No date: Heart murmur No date: Homelessness No date: Hyperlipidemia No date: Hypertension     Comment:  pt stated "every once in a while BP will be high but has              not been prescribed medication for HTN.  No date: PFO (patent foramen ovale)     Comment:  ?per ECHO- pt is unsure of this No date: Seasonal allergies 02/22/2016: Secondary diabetes mellitus with stage 3 chronic kidney  disease (GFR 30-59) (HCC)        Home Medications Prior to Admission medications   Medication Sig Start Date End Date Taking? Authorizing Provider  albuterol (VENTOLIN HFA) 108 (90 Base) MCG/ACT inhaler INHALE 2 PUFFS INTO THE LUNGS EVERY 6 HOURS AS NEEDED FOR WHEEZING OR SHORTNESS OF BREATH. 01/21/22   Claiborne Rigg, NP  amLODipine (NORVASC) 10 MG tablet Take 1 tablet (10 mg total) by mouth daily. 08/06/22   Lorin Glass, MD  busPIRone (BUSPAR) 10 MG tablet Take 1 tablet (10 mg total) by mouth 2 (two) times daily. 01/21/22   Claiborne Rigg, NP  ferrous sulfate 325 (65 FE) MG tablet Take 1 tablet (325 mg total) by  mouth daily with breakfast. 08/05/22   Dahal, Melina Schools, MD  Fluticasone-Umeclidin-Vilant (TRELEGY ELLIPTA) 100-62.5-25 MCG/ACT AEPB Inhale 1 puff into the lungs daily. 01/21/22   Claiborne Rigg, NP  gabapentin (NEURONTIN) 100 MG capsule Take 1 capsule (100 mg total) by mouth 3 (three) times daily. 01/21/22   Claiborne Rigg, NP  ipratropium-albuterol (DUONEB) 0.5-2.5 (3) MG/3ML SOLN Take 3 mLs by nebulization every 6 (six) hours as needed (shortness of breath and wheezing). 01/21/22   Claiborne Rigg, NP  Multiple Vitamin (MULTIVITAMIN WITH MINERALS) TABS tablet Take 1 tablet by mouth daily. 06/29/20   [name], MD  oxyCODONE-acetaminophen (PERCOCET/ROXICET) 5-325 MG tablet Take 1 tablet by mouth every 8 (eight) hours as needed for severe pain or moderate pain. 08/05/22   Lorin Glass, MD  pantoprazole (PROTONIX) 40 MG tablet Take 1 tablet (40 mg total) by mouth daily. 08/05/22   Lorin Glass, MD  sodium bicarbonate 650 MG tablet Take 2 tablets (1,300 mg total) by mouth 3 (three) times daily. 01/25/22     traZODone (DESYREL) 50 MG tablet Take 1 tablet (50 mg total) by mouth at bedtime as needed for sleep. 01/21/22   Claiborne Rigg, NP      Allergies    Zestril [lisinopril]    Review of Systems   Review of Systems  Physical Exam Updated Vital Signs BP (!) 172/75 (  BP Location: Right Arm)   Pulse (!) 102   Temp 97.9 F (36.6 C)   Resp (!) 23   Ht 5\' 7"  (1.702 m)   Wt 53.5 kg   SpO2 91%   BMI 18.48 kg/m  Physical Exam  ED Results / Procedures / Treatments   Labs (all labs ordered are listed, but only abnormal results are displayed) Labs Reviewed  CULTURE, BLOOD (ROUTINE X 2)  CULTURE, BLOOD (ROUTINE X 2)  COMPREHENSIVE METABOLIC PANEL  LACTIC ACID, PLASMA  LACTIC ACID, PLASMA  CBC WITH DIFFERENTIAL/PLATELET  PROTIME-INR  URINALYSIS, W/ REFLEX TO CULTURE (INFECTION SUSPECTED)  BRAIN NATRIURETIC PEPTIDE    EKG EKG Interpretation  Date/Time:  Monday Aug 12 2022  16:42:16 EDT Ventricular Rate:  95 PR Interval:  126 QRS Duration: 82 QT Interval:  366 QTC Calculation: 459 R Axis:   69 Text Interpretation: Atrial fibrillation Cannot rule out Anterior infarct , age undetermined Abnormal ECG When compared with ECG of 01-Aug-2022 17:53, Poor R wave progression NOW PRESENT Confirmed by Vonita Moss [phone]) on 08/12/2022 5:12:46 PM  Radiology No results found.  Procedures Procedures  {Document cardiac monitor, telemetry assessment procedure when appropriate:1}  Medications Ordered in ED Medications - No data to display  ED Course/ Medical Decision Making/ A&P   {   Click here for ABCD2, HEART and other calculatorsREFRESH Note before signing :1}                          Medical Decision Making Amount and/or Complexity of Data Reviewed Labs: ordered. Radiology: ordered.   ***  {Document critical care time when appropriate:1} {Document review of labs and clinical decision tools ie heart score, Chads2Vasc2 etc:1}  {Document your independent review of radiology images, and any outside records:1} {Document your discussion with family members, caretakers, and with consultants:1} {Document social determinants of health affecting pt's care:1} {Document your decision making why or why not admission, treatments were needed:1} Final Clinical Impression(s) / ED Diagnoses Final diagnoses:  None    Rx / DC Orders ED Discharge Orders     None

## 2022-08-12 NOTE — ED Notes (Signed)
Pt O2 sat currently at 96% on 2L [place]

## 2022-08-12 NOTE — H&P (Addendum)
History and Physical   [name] ZOX:[id number] DOB: October 06, 1960 DOA: 08/12/2022  PCP: [name], MD   Patient coming from: Home  Chief Complaint: Shortness of breath  HPI: [name] is a 62 y.o. female with medical history significant of anemia, PAD, GERD, gastritis, hypertension, hyperlipidemia, CKD 5, COPD, hepatitis C, substance use, depression, anxiety, diastolic CHF presenting with shortness of breath.  Patient was recently admitted from 5/16 until 5/20.  Admission was for symptomatic anemia due to chronic GI bleeding.  Was transfused 2 units and hemoglobin improved from 5.6-7 and remained stable at 7, apparently was FOBT negative during that admission so bleed believed to be chronic or intermittent.  Patient received dose of EPO also.  Ever since discharge patient has noticed some shortness of breath.  States that it feels like fluid may be building up.  Has noticed edema as well.  She has had a cough that has been intermittently productive.  As far as her substance use goes she is stopped using IV drugs and cocaine and has been using marijuana only.  She had been prescribed Lasix as needed, but this is on hold according to discharge summary.  Likely due to patient coming in with AKI with suspicion for dehydration that admission.  She denies fevers, chills, chest pain, abdominal pain, constipation, diarrhea, nausea, vomiting.  ED Course: Vital signs in the ED notable for heart rate in the 90s to 100s, blood pressure in the 130s to 170 systolic, respiratory rate in the teens to 20s, requiring 2 L to maintain saturations in the ED.  Lab workup included CMP with sodium 132, potassium 3.4, bicarb 14, BUN 58, creatinine 4.68 which is elevated from baseline 1.4, glucose 136, calcium 7.7, albumin 2.2.  CBC currently with hemoglobin stable at 7.0, PT and INR pending.  BNP elevated to 1084.  Lactic acid borderline at 2.1 with repeat pending.  Urinalysis pending.  Blood cultures  pending.  Chest x-ray with changes consistent with pulmonary edema and right greater than left small pleural effusions.  Patient received Solu-Medrol, DuoNebs, 6 mg IV Lasix in the ED.  Review of Systems: As per HPI otherwise all other systems reviewed and are negative.  Past Medical History:  Diagnosis Date   Alcohol abuse    Allergy    Anxiety    Arthritis    Asthma    Cannabis abuse    Cocaine abuse (HCC)    COPD (chronic obstructive pulmonary disease) (HCC)    Depression    Drug addiction (HCC)    GERD (gastroesophageal reflux disease)    Heart murmur    Homelessness    Hyperlipidemia    Hypertension    pt stated "every once in a while BP will be high but has not been prescribed medication for HTN.    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) 02/22/2016    Past Surgical History:  Procedure Laterality Date   BIOPSY  01/01/2019   Procedure: BIOPSY;  Surgeon: Beverley Fiedler, MD;  Location: [facility] ENDOSCOPY;  Service: Endoscopy;;   BIOPSY  11/17/2021   Procedure: BIOPSY;  Surgeon: Lynann Bologna, MD;  Location: [facility] ENDOSCOPY;  Service: Gastroenterology;;   CESAREAN SECTION  1989   COLONOSCOPY  11/07/2020   2018   CYSTOSCOPY W/ URETERAL STENT PLACEMENT Left 08/01/2018   Procedure: CYSTOSCOPY WITH RETROGRADE  PYELOGRAM/URETERAL STENT PLACEMENT;  Surgeon: Crista Elliot, MD;  Location: [facility] ORS;  Service: Urology;  Laterality: Left;   CYSTOSCOPY WITH RETROGRADE PYELOGRAM, URETEROSCOPY AND STENT PLACEMENT Left 01/15/2019   Procedure: CYSTOSCOPY WITH RETROGRADE PYELOGRAM, URETEROSCOPY AND STENT PLACEMENT;  Surgeon: Crista Elliot, MD;  Location: [facility] ORS;  Service: Urology;  Laterality: Left;   ESOPHAGOGASTRODUODENOSCOPY (EGD) WITH PROPOFOL N/A 01/01/2019   Procedure: ESOPHAGOGASTRODUODENOSCOPY (EGD) WITH PROPOFOL;  Surgeon: Beverley Fiedler, MD;  Location: [facility]  ENDOSCOPY;  Service: Endoscopy;  Laterality: N/A;   ESOPHAGOGASTRODUODENOSCOPY (EGD) WITH PROPOFOL N/A 08/21/2019   Procedure: ESOPHAGOGASTRODUODENOSCOPY (EGD) WITH PROPOFOL;  Surgeon: Napoleon Form, MD;  Location: [facility] ENDOSCOPY;  Service: Endoscopy;  Laterality: N/A;   ESOPHAGOGASTRODUODENOSCOPY (EGD) WITH PROPOFOL N/A 11/17/2021   Procedure: ESOPHAGOGASTRODUODENOSCOPY (EGD) WITH PROPOFOL;  Surgeon: Lynann Bologna, MD;  Location: [facility] ENDOSCOPY;  Service: Gastroenterology;  Laterality: N/A;   FRACTURE SURGERY Left 2011   arm   UPPER GASTROINTESTINAL ENDOSCOPY      Social History  reports that she has been smoking cigarettes. She has been smoking an average of 1.00 packs per day. She has been exposed to tobacco smoke. She has never used smokeless tobacco. She reports that she does not currently use alcohol. She reports current drug use. Drug: Marijuana.  Allergies  Allergen Reactions   Zestril [Lisinopril] Other (See Comments)    Hyperkalemia    Family History  Problem Relation Age of Onset   Diabetes Father    Colon cancer Neg Hx    Esophageal cancer Neg Hx    Rectal cancer Neg Hx    Stomach cancer Neg Hx   Reviewed on admission  Prior to Admission medications   Medication Sig Start Date End Date Taking? Authorizing Provider  acetaminophen (TYLENOL) 500 MG tablet Take 1,000 mg by mouth 2 (two) times daily as needed for moderate pain, fever or headache.   Yes [provider]  albuterol (VENTOLIN HFA) 108 (90 Base) MCG/ACT inhaler INHALE 2 PUFFS INTO THE LUNGS EVERY 6 HOURS AS NEEDED FOR WHEEZING OR SHORTNESS OF BREATH. 01/21/22  Yes Claiborne Rigg, NP  amLODipine (NORVASC) 10 MG tablet Take 1 tablet (10 mg total) by mouth daily. 08/06/22  Yes Dahal, Melina Schools, MD  busPIRone (BUSPAR) 10 MG tablet Take 1 tablet (10 mg total) by mouth 2 (two) times daily. 01/21/22  Yes Claiborne Rigg, NP  ferrous sulfate 325 (65 FE) MG tablet Take 1 tablet (325 mg total) by mouth daily with  breakfast. 08/05/22  Yes Dahal, Melina Schools, MD  Fluticasone-Umeclidin-Vilant (TRELEGY ELLIPTA) 100-62.5-25 MCG/ACT AEPB Inhale 1 puff into the lungs daily. 01/21/22  Yes Claiborne Rigg, NP  gabapentin (NEURONTIN) 100 MG capsule Take 1 capsule (100 mg total) by mouth 3 (three) times daily. 01/21/22  Yes Claiborne Rigg, NP  ipratropium-albuterol (DUONEB) 0.5-2.5 (3) MG/3ML SOLN Take 3 mLs by nebulization every 6 (six) hours as needed (shortness of breath and wheezing). Patient taking differently: Take 3 mLs by nebulization every 6 (six) hours as needed (shortness of breath, wheezing). 01/21/22  Yes Claiborne Rigg, NP  Multiple Vitamin (MULTIVITAMIN WITH MINERALS) TABS tablet Take 1 tablet by mouth daily. 06/29/20  Yes [name], MD  oxyCODONE-acetaminophen (PERCOCET/ROXICET) 5-325 MG tablet Take 1 tablet by mouth every 8 (eight) hours as needed for severe pain or moderate pain. 08/05/22  Yes Dahal, Melina Schools, MD  pantoprazole (PROTONIX) 40 MG tablet Take 1 tablet (40 mg total) by mouth daily. 08/05/22  Yes Dahal, Melina Schools,  MD  sodium bicarbonate 650 MG tablet Take 2 tablets (1,300 mg total) by mouth 3 (three) times daily. 01/25/22  Yes   traZODone (DESYREL) 50 MG tablet Take 1 tablet (50 mg total) by mouth at bedtime as needed for sleep. 01/21/22  Yes Claiborne Rigg, NP    Physical Exam: Vitals:   08/12/22 1710 08/12/22 1800 08/12/22 1815 08/12/22 1845  BP:  138/68 139/69 (!) 153/69  Pulse:  97 76 94  Resp:  16 14 14   Temp:      SpO2:  95% 94% 99%  Weight: 53.5 kg     Height: 5\' 7"  (1.702 m)       Physical Exam Constitutional:      General: She is not in acute distress.    Appearance: Normal appearance.  HENT:     Head: Normocephalic and atraumatic.     Mouth/Throat:     Mouth: Mucous membranes are moist.     Pharynx: Oropharynx is clear.  Eyes:     Extraocular Movements: Extraocular movements intact.     Pupils: Pupils are equal, round, and reactive to light.  Cardiovascular:      Rate and Rhythm: Normal rate and regular rhythm.     Pulses: Normal pulses.     Heart sounds: Normal heart sounds.  Pulmonary:     Effort: Pulmonary effort is normal. No respiratory distress.     Breath sounds: Wheezing (minimal) and rales present.  Abdominal:     General: Bowel sounds are normal. There is no distension.     Palpations: Abdomen is soft.     Tenderness: There is no abdominal tenderness.  Musculoskeletal:        General: No swelling or deformity.     Right lower leg: Edema present.     Left lower leg: Edema present.     Comments: LLE scaring and scar tissue  Skin:    General: Skin is warm and dry.  Neurological:     General: No focal deficit present.     Mental Status: Mental status is at baseline.    Labs on Admission: I have personally reviewed following labs and imaging studies  CBC: Recent Labs  Lab 08/12/22 1708  WBC 7.3  NEUTROABS 5.0  HGB 7.0*  HCT 22.0*  MCV 97.3  PLT 371    Basic Metabolic Panel: Recent Labs  Lab 08/12/22 1708  NA 132*  K 3.4*  CL 103  CO2 14*  GLUCOSE 136*  BUN 58*  CREATININE 4.68*  CALCIUM 7.7*    GFR: Estimated Creatinine Clearance: 10.7 mL/min (A) (by C-G formula based on SCr of 4.68 mg/dL (H)).  Liver Function Tests: Recent Labs  Lab 08/12/22 1708  AST 16  ALT 11  ALKPHOS 77  BILITOT 0.6  PROT 7.1  ALBUMIN 2.2*    Urine analysis:    Component Value Date/Time   COLORURINE YELLOW 08/19/2019 1629   APPEARANCEUR HAZY (A) 08/19/2019 1629   LABSPEC 1.002 (L) 08/19/2019 1629   PHURINE 6.0 08/19/2019 1629   GLUCOSEU NEGATIVE 08/19/2019 1629   HGBUR SMALL (A) 08/19/2019 1629   BILIRUBINUR NEGATIVE 08/19/2019 1629   KETONESUR NEGATIVE 08/19/2019 1629   PROTEINUR 100 (A) 08/19/2019 1629   UROBILINOGEN 0.2 03/02/2010 2027   NITRITE POSITIVE (A) 08/19/2019 1629   LEUKOCYTESUR SMALL (A) 08/19/2019 1629    Radiological Exams on Admission: DG Chest 2 View  Result Date: 08/12/2022 CLINICAL DATA:   Suspected sepsis, fluid retention, shortness of breath, anasarca, COPD, emphysema  EXAM: CHEST - 2 VIEW COMPARISON:  08/01/2022 FINDINGS: Cardiomegaly. Diffuse bilateral interstitial pulmonary opacity and right-greater-than-left small pleural effusions. Disc degenerative disease of the thoracic spine. IMPRESSION: Cardiomegaly with diffuse bilateral interstitial pulmonary opacity and right-greater-than-left small pleural effusions, consistent with edema. No focal airspace opacity. Electronically Signed   By: Jearld Lesch M.D.   On: 08/12/2022 18:12    EKG: Independently reviewed.  Sinus rhythm at 95 bpm.  Low voltage multiple leads including low voltage P waves leading EKG computer read as A-fib but rate is regular other than apparent PAC versus drop beat versus sinus arrhythmia at beginning of rhythm strip.  Assessment/Plan Principal Problem:   Acute on chronic diastolic CHF (congestive heart failure) (HCC) Active Problems:   Chronic hepatitis C without hepatic coma (HCC)   HLD (hyperlipidemia)   Hypertension   Chronic leg pain   Anxiety and depression   Gastroesophageal reflux disease   IDA (iron deficiency anemia)   Chronic obstructive pulmonary disease (HCC)   Polysubstance abuse (HCC)   Gastritis and gastroduodenitis   PUD (peptic ulcer disease)   CKD (chronic kidney disease) stage 5, GFR less than 15 ml/min (HCC)   Acute respiratory failure with hypoxia (HCC)   Acute respiratory failure with hypoxia Acute on chronic diastolic CHF ?COPD exacerbation > Patient presenting with shortness of breath that has been ongoing since around the time of discharge about a week ago.  States that she feels like she is bowing of fluid given her shortness of breath and edema. > Known history of CHF with last echo in 2023 with EF 55-60%, G1 DD.  Had been on as needed Lasix, but held with AKI/Dehydration.  Does have CKD 5, but still makes urine. CKD could be contributing to fluid buildup as well. > Chest  x-ray in the ED notable for cardiomegaly, pulmonary edema, bilateral small effusions.  BNP elevated to 1084.  Creatinine elevated but unclear if true AKI.  Potassium 3.4. > Received 60 mg IV Lasix in the ED.  Additionally due to significant wheezing on exam received Solu-Medrol and DuoNebs.  Could be a component of cardiac wheeze but will continue some COPD exacerbation treatment given history of this. - Lemar-term telemetry overnight - Continue with supplemental oxygen, wean as tolerated For CHF exacerbation: - 80 mg IV Lasix twice daily (monitor output, with CKD 4/5 may need increased dose) - Strict I's and O, daily weights - Echocardiogram - Trend renal function and electrolytes - Replete potassium - Check magnesium For Suspected concurrent COPD exacerbation: - Will continue with scheduled Atrovent while awake - Replace home Trelegy with formulary Breo and Incruse - Continue prn Albuterol - Hold off on further steroids pending response to treatment of CHF  CKD 5 Hypokalemia > Creatinine near baseline but elevated at 4.68 in the ED.  Baseline varies but seems to average around 1.4.  Potassium noted to be 3.4. > Patient does still make urine.  Is been told in the past that she may need dialysis for too [place] but currently does not have any access. - Given concurrent diuresis, replating with 60 mill equivalents p.o. potassium - Check magnesium - Trend renal function and electrolytes - Continue home bicarb  Anemia Pud Gastritis GERD Diverticulosis > Patient with recent admission for symptomatic anemia.  Not thought to be significant active bleed at that time probably chronic versus intermittent. > Hemoglobin improved from 5.6 greater than 7 with 2 units transfusion has remained stable at 7. - Trend CBC - Continue home PPI -  Continue home iron  Hypertension - Holding home amlodipine in setting of initiation of Lasix as above  Substance use > Reports currently using marijuana  only  Depression Anxiety - Continue home BuSpar and trazodone  Neuropathy - Continue home Gabapentin  DVT prophylaxis: Heparin Code Status:   Full Family Communication:  None on admission  Disposition Plan:   Patient is from:  Home  Anticipated DC to:  Home  Anticipated DC date:  1 to 4 days  Anticipated DC barriers: None  Consults called:  None Admission status:  Observation, telemetry  Severity of Illness: The appropriate patient status for this patient is OBSERVATION. Observation status is judged to be reasonable and necessary in order to provide the required intensity of service to ensure the patient's safety. The patient's presenting symptoms, physical exam findings, and initial radiographic and laboratory data in the context of their medical condition is felt to place them at decreased risk for further clinical deterioration. Furthermore, it is anticipated that the patient will be medically stable for discharge from the hospital within 2 midnights of admission.    Synetta Fail MD [facility]  How to contact the [facility] Attending or Consulting provider 7A - 7P or covering provider during after hours 7P -7A, for this patient?   Check the care team in [facility] and look for a) attending/consulting TR[facility] provider listed and b) the [facility] team listed Log into [email or web address] and use [place]'s universal password to access. If you do not have the password, please contact the hospital operator. Locate the [facility] provider you are looking for under [facility] and page to a number that you can be directly reached. If you still have difficulty reaching the provider, please page the [facility] (Director on Call) for the Hospitalists listed on amion for assistance.  08/12/2022, 7:22 PM

## 2022-08-13 ENCOUNTER — Observation Stay (HOSPITAL_COMMUNITY): Payer: Medicare Other

## 2022-08-13 DIAGNOSIS — B182 Chronic viral hepatitis C: Secondary | ICD-10-CM | POA: Diagnosis present

## 2022-08-13 DIAGNOSIS — Z5901 Sheltered homelessness: Secondary | ICD-10-CM | POA: Diagnosis not present

## 2022-08-13 DIAGNOSIS — J9691 Respiratory failure, unspecified with hypoxia: Secondary | ICD-10-CM | POA: Diagnosis not present

## 2022-08-13 DIAGNOSIS — Z1612 Extended spectrum beta lactamase (ESBL) resistance: Secondary | ICD-10-CM | POA: Diagnosis not present

## 2022-08-13 DIAGNOSIS — E1122 Type 2 diabetes mellitus with diabetic chronic kidney disease: Secondary | ICD-10-CM | POA: Diagnosis not present

## 2022-08-13 DIAGNOSIS — Z992 Dependence on renal dialysis: Secondary | ICD-10-CM | POA: Diagnosis not present

## 2022-08-13 DIAGNOSIS — I120 Hypertensive chronic kidney disease with stage 5 chronic kidney disease or end stage renal disease: Secondary | ICD-10-CM | POA: Diagnosis not present

## 2022-08-13 DIAGNOSIS — K921 Melena: Secondary | ICD-10-CM | POA: Diagnosis not present

## 2022-08-13 DIAGNOSIS — D649 Anemia, unspecified: Secondary | ICD-10-CM | POA: Diagnosis not present

## 2022-08-13 DIAGNOSIS — I5033 Acute on chronic diastolic (congestive) heart failure: Secondary | ICD-10-CM

## 2022-08-13 DIAGNOSIS — D62 Acute posthemorrhagic anemia: Secondary | ICD-10-CM | POA: Diagnosis present

## 2022-08-13 DIAGNOSIS — F17210 Nicotine dependence, cigarettes, uncomplicated: Secondary | ICD-10-CM | POA: Diagnosis not present

## 2022-08-13 DIAGNOSIS — E1140 Type 2 diabetes mellitus with diabetic neuropathy, unspecified: Secondary | ICD-10-CM | POA: Diagnosis present

## 2022-08-13 DIAGNOSIS — N186 End stage renal disease: Secondary | ICD-10-CM | POA: Diagnosis not present

## 2022-08-13 DIAGNOSIS — N390 Urinary tract infection, site not specified: Secondary | ICD-10-CM | POA: Diagnosis not present

## 2022-08-13 DIAGNOSIS — J90 Pleural effusion, not elsewhere classified: Secondary | ICD-10-CM | POA: Diagnosis not present

## 2022-08-13 DIAGNOSIS — I132 Hypertensive heart and chronic kidney disease with heart failure and with stage 5 chronic kidney disease, or end stage renal disease: Secondary | ICD-10-CM | POA: Diagnosis present

## 2022-08-13 DIAGNOSIS — Z1152 Encounter for screening for COVID-19: Secondary | ICD-10-CM | POA: Diagnosis not present

## 2022-08-13 DIAGNOSIS — Z66 Do not resuscitate: Secondary | ICD-10-CM | POA: Diagnosis not present

## 2022-08-13 DIAGNOSIS — I509 Heart failure, unspecified: Secondary | ICD-10-CM | POA: Diagnosis not present

## 2022-08-13 DIAGNOSIS — J189 Pneumonia, unspecified organism: Secondary | ICD-10-CM | POA: Diagnosis not present

## 2022-08-13 DIAGNOSIS — K3189 Other diseases of stomach and duodenum: Secondary | ICD-10-CM | POA: Diagnosis not present

## 2022-08-13 DIAGNOSIS — R0902 Hypoxemia: Secondary | ICD-10-CM | POA: Diagnosis not present

## 2022-08-13 DIAGNOSIS — N185 Chronic kidney disease, stage 5: Secondary | ICD-10-CM | POA: Diagnosis not present

## 2022-08-13 DIAGNOSIS — K31811 Angiodysplasia of stomach and duodenum with bleeding: Secondary | ICD-10-CM | POA: Diagnosis not present

## 2022-08-13 DIAGNOSIS — Z515 Encounter for palliative care: Secondary | ICD-10-CM | POA: Diagnosis not present

## 2022-08-13 DIAGNOSIS — D631 Anemia in chronic kidney disease: Secondary | ICD-10-CM | POA: Diagnosis present

## 2022-08-13 DIAGNOSIS — N179 Acute kidney failure, unspecified: Secondary | ICD-10-CM | POA: Diagnosis present

## 2022-08-13 DIAGNOSIS — K5521 Angiodysplasia of colon with hemorrhage: Secondary | ICD-10-CM | POA: Diagnosis present

## 2022-08-13 DIAGNOSIS — R0602 Shortness of breath: Secondary | ICD-10-CM | POA: Diagnosis present

## 2022-08-13 DIAGNOSIS — F32A Depression, unspecified: Secondary | ICD-10-CM | POA: Diagnosis present

## 2022-08-13 DIAGNOSIS — E872 Acidosis, unspecified: Secondary | ICD-10-CM | POA: Diagnosis present

## 2022-08-13 DIAGNOSIS — K449 Diaphragmatic hernia without obstruction or gangrene: Secondary | ICD-10-CM | POA: Diagnosis not present

## 2022-08-13 DIAGNOSIS — J440 Chronic obstructive pulmonary disease with acute lower respiratory infection: Secondary | ICD-10-CM | POA: Diagnosis not present

## 2022-08-13 DIAGNOSIS — E871 Hypo-osmolality and hyponatremia: Secondary | ICD-10-CM | POA: Diagnosis present

## 2022-08-13 DIAGNOSIS — K317 Polyp of stomach and duodenum: Secondary | ICD-10-CM | POA: Diagnosis not present

## 2022-08-13 DIAGNOSIS — J9601 Acute respiratory failure with hypoxia: Secondary | ICD-10-CM | POA: Diagnosis not present

## 2022-08-13 DIAGNOSIS — K5520 Angiodysplasia of colon without hemorrhage: Secondary | ICD-10-CM | POA: Diagnosis not present

## 2022-08-13 DIAGNOSIS — J441 Chronic obstructive pulmonary disease with (acute) exacerbation: Secondary | ICD-10-CM | POA: Diagnosis not present

## 2022-08-13 LAB — ECHOCARDIOGRAM COMPLETE
AR max vel: 1.42 cm2
AV Area VTI: 1.4 cm2
AV Area mean vel: 1.36 cm2
AV Mean grad: 12.5 mmHg
AV Peak grad: 22.5 mmHg
Ao pk vel: 2.37 m/s
Area-P 1/2: 3.85 cm2
Calc EF: 54.1 %
Height: 67 in
MV M vel: 4.78 m/s
MV Peak grad: 91.4 mmHg
MV VTI: 1.39 cm2
Radius: 0.6 cm
S' Lateral: 3.7 cm
Single Plane A2C EF: 53.7 %
Single Plane A4C EF: 56.6 %
Weight: 2232 oz

## 2022-08-13 LAB — COMPREHENSIVE METABOLIC PANEL
ALT: 10 U/L (ref 0–44)
AST: 16 U/L (ref 15–41)
Albumin: 2.1 g/dL — ABNORMAL LOW (ref 3.5–5.0)
Alkaline Phosphatase: 73 U/L (ref 38–126)
Anion gap: 15 (ref 5–15)
BUN: 60 mg/dL — ABNORMAL HIGH (ref 8–23)
CO2: 15 mmol/L — ABNORMAL LOW (ref 22–32)
Calcium: 7.6 mg/dL — ABNORMAL LOW (ref 8.9–10.3)
Chloride: 102 mmol/L (ref 98–111)
Creatinine, Ser: 4.78 mg/dL — ABNORMAL HIGH (ref 0.44–1.00)
GFR, Estimated: 10 mL/min — ABNORMAL LOW (ref >60)
Glucose, Bld: 149 mg/dL — ABNORMAL HIGH (ref 70–99)
Potassium: 4 mmol/L (ref 3.5–5.1)
Sodium: 132 mmol/L — ABNORMAL LOW (ref 135–145)
Total Bilirubin: 0.4 mg/dL (ref 0.3–1.2)
Total Protein: 6.7 g/dL (ref 6.5–8.1)

## 2022-08-13 LAB — IRON AND TIBC
Iron: 21 ug/dL — ABNORMAL LOW (ref 28–170)
Saturation Ratios: 8 % — ABNORMAL LOW (ref 10.4–31.8)
TIBC: 280 ug/dL (ref 250–450)
UIBC: 259 ug/dL

## 2022-08-13 LAB — CULTURE, BLOOD (ROUTINE X 2)

## 2022-08-13 LAB — FERRITIN: Ferritin: 160 ng/mL (ref 11–307)

## 2022-08-13 LAB — TYPE AND SCREEN

## 2022-08-13 LAB — HEPATITIS B SURFACE ANTIGEN: Hepatitis B Surface Ag: NONREACTIVE

## 2022-08-13 LAB — CBC
HCT: 20.4 % — ABNORMAL LOW (ref 36.0–46.0)
Hemoglobin: 6.6 g/dL — CL (ref 12.0–15.0)
MCH: 29.9 pg (ref 26.0–34.0)
MCHC: 32.4 g/dL (ref 30.0–36.0)
MCV: 92.3 fL (ref 80.0–100.0)
Platelets: 363 10*3/uL (ref 150–400)
RBC: 2.21 MIL/uL — ABNORMAL LOW (ref 3.87–5.11)
RDW: 16.8 % — ABNORMAL HIGH (ref 11.5–15.5)
WBC: 6.5 10*3/uL (ref 4.0–10.5)
nRBC: 0 % (ref 0.0–0.2)

## 2022-08-13 LAB — RETICULOCYTES
Immature Retic Fract: 35.4 % — ABNORMAL HIGH (ref 2.3–15.9)
RBC.: 2.29 MIL/uL — ABNORMAL LOW (ref 3.87–5.11)
Retic Count, Absolute: 51.1 10*3/uL (ref 19.0–186.0)
Retic Ct Pct: 2.2 % (ref 0.4–3.1)

## 2022-08-13 LAB — FOLATE: Folate: 8.5 ng/mL (ref >5.9)

## 2022-08-13 LAB — HEMOGLOBIN AND HEMATOCRIT, BLOOD
HCT: 23 % — ABNORMAL LOW (ref 36.0–46.0)
Hemoglobin: 7.4 g/dL — ABNORMAL LOW (ref 12.0–15.0)

## 2022-08-13 LAB — BPAM RBC
Blood Product Expiration Date: 202406162359
ISSUE DATE / TIME: 202405281021

## 2022-08-13 LAB — OCCULT BLOOD X 1 CARD TO LAB, STOOL: Fecal Occult Bld: POSITIVE — AB

## 2022-08-13 LAB — VITAMIN B12: Vitamin B-12: 507 pg/mL (ref 180–914)

## 2022-08-13 LAB — PREPARE RBC (CROSSMATCH)

## 2022-08-13 MED ORDER — FUROSEMIDE 10 MG/ML IJ SOLN
20.0000 mg | INTRAMUSCULAR | Status: DC | PRN
  Filled 2022-08-13: qty 2

## 2022-08-13 MED ORDER — SODIUM CHLORIDE 0.9% IV SOLUTION: Freq: Once | INTRAVENOUS | Status: AC

## 2022-08-13 MED ORDER — SODIUM CHLORIDE 0.9 % IV SOLN
250.0000 mg | Freq: Every day | INTRAVENOUS | Status: AC
  Administered 2022-08-13 – 2022-08-16 (×4): 250 mg via INTRAVENOUS
  Filled 2022-08-13 (×3): qty 20
  Filled 2022-08-13: qty 250
  Filled 2022-08-13: qty 20

## 2022-08-13 MED ORDER — IPRATROPIUM BROMIDE 0.02 % IN SOLN
0.5000 mg | RESPIRATORY_TRACT | Status: DC | PRN
  Administered 2022-08-13: 0.5 mg via RESPIRATORY_TRACT
  Filled 2022-08-13: qty 2.5

## 2022-08-13 MED ORDER — OXYCODONE-ACETAMINOPHEN 5-325 MG PO TABS
1.0000 | ORAL_TABLET | Freq: Three times a day (TID) | ORAL | Status: DC | PRN
  Administered 2022-08-13 – 2022-08-17 (×11): 1 via ORAL
  Filled 2022-08-13 (×11): qty 1

## 2022-08-13 MED ORDER — SODIUM CHLORIDE 0.9 % IV SOLN
1.5000 g | INTRAVENOUS | Status: AC
  Administered 2022-08-14: 1.5 g via INTRAVENOUS
  Filled 2022-08-13 (×2): qty 1.5

## 2022-08-13 MED ORDER — DARBEPOETIN ALFA 200 MCG/0.4ML IJ SOSY
200.0000 ug | PREFILLED_SYRINGE | INTRAMUSCULAR | Status: DC
  Administered 2022-08-13: 200 ug via SUBCUTANEOUS
  Filled 2022-08-13 (×2): qty 0.4

## 2022-08-13 MED ORDER — ZINC OXIDE 40 % EX OINT
TOPICAL_OINTMENT | Freq: Two times a day (BID) | CUTANEOUS | Status: DC
  Administered 2022-08-13 – 2022-08-19 (×5): 1 via TOPICAL
  Filled 2022-08-13: qty 57

## 2022-08-13 MED ORDER — CHLORHEXIDINE GLUCONATE CLOTH 2 % EX PADS
6.0000 | MEDICATED_PAD | Freq: Every day | CUTANEOUS | Status: DC
  Administered 2022-08-14 – 2022-08-15 (×2): 6 via TOPICAL

## 2022-08-13 MED ORDER — ZINC OXIDE 40 % EX OINT
TOPICAL_OINTMENT | Freq: Two times a day (BID) | CUTANEOUS | Status: DC
  Filled 2022-08-13: qty 57

## 2022-08-13 NOTE — Consult Note (Addendum)
WOC Nurse Consult Note: Reason for Consult: Consult requested for lesions of unknown etiology on buttocks/perineum/labia of unknown etiology.  Pt states "they have been there awhile and my Dr. gave me some cream to use before I came in." Affected areas have red raised lesions in scattered areas.  Left upper sacrum with dark purple bruise which is raised above skin level, 2X2cm, hard to palpation.  Location and appearance is NOT consistent with a Deep tissue pressure injury.  Topical treatment will be minimally effective to promote healing since none of these locations are an open wound. Secure chat message sent to the primary team to request an STP panel; Pt had chlamydia and trichomoniasis in the past, according to the EMR. Topical treatment orders provided for bedside nurses to perform as follows: Apply Desitin to perineum/buttocks BID and PRN if turning or cleaning. Please re-consult if further assistance is needed.  Thank-you,  Cammie Mcgee MSN, RN, CWOCN, [place], CNS [phone]

## 2022-08-13 NOTE — Progress Notes (Addendum)
Upon assessment pt presents with labored breathing and is requesting her inhalers. SPO2 is 92% on 2 L O2 [name]. Pt has productive cough. Resp therapist at bedside to assess as well.   Pt breathing improves after breathing  tx. Pt states she stays at the [facility]. Pt may benefit from housing or social work consult.

## 2022-08-13 NOTE — Consult Note (Signed)
[facility] Renal Consultation Note  Requesting MD: Dayna Barker Indication for Consultation: progressive CKD-  volume overload  HPI:  [name] is a 62 y.o. female with HTN, history of substance abuse and hep C, poor social situation which has made follow up with nephrology a difficult proposition.  But, currently follows with Dr. [name].  Last seen on 5/15-  crt 4.4 at that time-  was overloaded-  lasix inc to 40 bid from 40 daily.  We have not been able to get her on ESA due to lack of insurance.  Pt has been hospitalized twice since 5/15-  first for anemia req tranfusion but now due to volume overload.  Of note, pt has been seen by [facility] as OP-  felt not to be candidate for AVF would need AVG-  were waiting for her to be closer to being dialysis requiring before being done-  also with same insurance issue.  So far given IV lasix without much response-  crt 4.78, hgb 6.6, bicarb 15, albumin 2.1. She has many complaints when I see her-  still edematous, can't breathe, can't eat but no nausea-  really failing to thrive-  also tarry stools  Creat  Date/Time Value Ref Range Status  04/29/2016 10:47 AM 1.64 (H) 0.50 - 1.05 mg/dL Final    Comment:      For patients > or = 62 years of age: The upper reference limit for Creatinine is approximately 13% higher for people identified as African-American.     01/16/2016 11:59 AM 1.37 (H) 0.50 - 1.05 mg/dL Final    Comment:      For patients > or = 62 years of age: The upper reference limit for Creatinine is approximately 13% higher for people identified as African-American.     09/29/2015 11:26 AM 1.13 (H) 0.50 - 1.05 mg/dL Final    Comment:      For patients > or = 62 years of age: The upper reference limit for Creatinine is approximately 13% higher for people identified as African-American.     12/22/2014 11:05 AM 1.02 0.50 - 1.05 mg/dL Final  40/98/1191 47:82 PM 1.27 (H) 0.50 - 1.10 mg/dL Final  95/62/1308 65:78 AM 0.76 0.50  - 1.10 mg/dL Final   Creatinine, Ser  Date/Time Value Ref Range Status  08/13/2022 12:39 AM 4.78 (H) 0.44 - 1.00 mg/dL Final  46/96/2952 84:13 PM 4.68 (H) 0.44 - 1.00 mg/dL Final  24/40/1027 25:36 AM 4.19 (H) 0.44 - 1.00 mg/dL Final  64/40/3474 25:95 AM 4.22 (H) 0.44 - 1.00 mg/dL Final  63/87/5643 32:95 AM 4.39 (H) 0.44 - 1.00 mg/dL Final  18/84/1660 63:01 AM 4.18 (H) 0.44 - 1.00 mg/dL Final  60/12/9321 55:73 PM 4.71 (H) 0.44 - 1.00 mg/dL Final  22/04/5425 06:23 PM 3.97 (H) 0.44 - 1.00 mg/dL Final  76/28/3151 76:16 AM 4.07 (H) 0.44 - 1.00 mg/dL Final  07/37/1062 69:48 PM 4.14 (H) 0.57 - 1.00 mg/dL Final  54/62/7035 00:93 AM 4.16 (H) 0.44 - 1.00 mg/dL Final  81/82/9937 16:96 AM 4.43 (H) 0.44 - 1.00 mg/dL Final  78/93/8101 75:10 AM 4.46 (H) 0.44 - 1.00 mg/dL Final  25/85/2778 24:23 AM 4.15 (H) 0.44 - 1.00 mg/dL Final  53/61/4431 54:00 PM 3.34 (H) 0.44 - 1.00 mg/dL Final  86/76/1950 93:26 PM 4.32 (H) 0.57 - 1.00 mg/dL Final  71/24/5809 98:33 AM 2.62 (H) 0.44 - 1.00 mg/dL Final  82/50/5397 67:34 PM 2.89 (H) 0.44 - 1.00 mg/dL Final  19/37/9024 09:73 PM 1.76 (H)  0.57 - 1.00 mg/dL Final  95/62/1308 65:78 AM 2.85 (H) 0.44 - 1.00 mg/dL Final  46/96/2952 84:13 AM 2.65 (H) 0.44 - 1.00 mg/dL Final  24/40/1027 25:36 AM 2.72 (H) 0.44 - 1.00 mg/dL Final  64/40/3474 25:95 PM 2.56 (H) 0.44 - 1.00 mg/dL Final  63/87/5643 32:95 AM 2.46 (H) 0.44 - 1.00 mg/dL Final  18/84/1660 63:01 AM 2.36 (H) 0.44 - 1.00 mg/dL Final  60/12/9321 55:73 PM 2.43 (H) 0.44 - 1.00 mg/dL Final  22/04/5425 06:23 PM 2.47 (H) 0.44 - 1.00 mg/dL Final  76/28/3151 76:16 PM 2.60 (H) 0.44 - 1.00 mg/dL Final  07/37/1062 69:48 PM 2.33 (H) 0.44 - 1.00 mg/dL Final  54/62/7035 00:93 AM 2.68 (H) 0.57 - 1.00 mg/dL Final  81/82/9937 16:96 AM 2.41 (H) 0.44 - 1.00 mg/dL Final  78/93/8101 75:10 AM 2.48 (H) 0.44 - 1.00 mg/dL Final  25/85/2778 24:23 AM 2.74 (H) 0.44 - 1.00 mg/dL Final  53/61/4431 54:00 PM 2.82 (H) 0.44 - 1.00 mg/dL Final   86/76/1950 93:26 AM 3.28 (H) 0.44 - 1.00 mg/dL Final  71/24/5809 98:33 AM 2.51 (H) 0.44 - 1.00 mg/dL Final  82/50/5397 67:34 PM 2.67 (H) 0.44 - 1.00 mg/dL Final  19/37/9024 09:73 AM 2.18 (H) 0.44 - 1.00 mg/dL Final  53/29/9242 68:34 AM 2.14 (H) 0.44 - 1.00 mg/dL Final  19/62/2297 98:92 AM 2.47 (H) 0.44 - 1.00 mg/dL Final  11/94/1740 81:44 AM 2.69 (H) 0.44 - 1.00 mg/dL Final  81/85/6314 97:02 PM 2.29 (H) 0.44 - 1.00 mg/dL Final  63/78/5885 02:77 PM 1.49 (H) 0.44 - 1.00 mg/dL Final  41/28/7867 67:20 AM 1.47 (H) 0.57 - 1.00 mg/dL Final  94/70/9628 36:62 PM 1.72 (H) 0.57 - 1.00 mg/dL Final  94/76/5465 03:54 AM 1.41 (H) 0.44 - 1.00 mg/dL Final  65/68/1275 17:00 AM 1.25 (H) 0.44 - 1.00 mg/dL Final  17/49/4496 75:91 PM 0.93 0.50 - 1.10 mg/dL Final  63/84/6659 93:57 AM 0.52 0.50 - 1.10 mg/dL Final  01/77/9390 30:09 PM 0.70 0.50 - 1.10 mg/dL Final  23/30/0762 26:33 PM 0.77 0.50 - 1.10 mg/dL Final  35/45/6256 38:93 PM 0.94 0.50 - 1.10 mg/dL Final     PMHx:   Past Medical History:  Diagnosis Date   Alcohol abuse    Allergy    Anxiety    Arthritis    Asthma    Cannabis abuse    Cocaine abuse (HCC)    COPD (chronic obstructive pulmonary disease) (HCC)    Depression    Drug addiction (HCC)    GERD (gastroesophageal reflux disease)    Heart murmur    Homelessness    Hyperlipidemia    Hypertension    pt stated "every once in a while BP will be high but has not been prescribed medication for HTN.    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) 02/22/2016    Past Surgical History:  Procedure Laterality Date   BIOPSY  01/01/2019   Procedure: BIOPSY;  Surgeon: Beverley Fiedler, MD;  Location: [facility] ENDOSCOPY;  Service: Endoscopy;;   BIOPSY  11/17/2021   Procedure: BIOPSY;  Surgeon: Lynann Bologna, MD;  Location: [facility] ENDOSCOPY;  Service: Gastroenterology;;   CESAREAN  SECTION  1989   COLONOSCOPY  11/07/2020   2018   CYSTOSCOPY W/ URETERAL STENT PLACEMENT Left 08/01/2018   Procedure: CYSTOSCOPY WITH RETROGRADE PYELOGRAM/URETERAL STENT PLACEMENT;  Surgeon: Modena Slater  D III, MD;  Location: [facility] ORS;  Service: Urology;  Laterality: Left;   CYSTOSCOPY WITH RETROGRADE PYELOGRAM, URETEROSCOPY AND STENT PLACEMENT Left 01/15/2019   Procedure: CYSTOSCOPY WITH RETROGRADE PYELOGRAM, URETEROSCOPY AND STENT PLACEMENT;  Surgeon: Crista Elliot, MD;  Location: [facility] ORS;  Service: Urology;  Laterality: Left;   ESOPHAGOGASTRODUODENOSCOPY (EGD) WITH PROPOFOL N/A 01/01/2019   Procedure: ESOPHAGOGASTRODUODENOSCOPY (EGD) WITH PROPOFOL;  Surgeon: Beverley Fiedler, MD;  Location: [facility] ENDOSCOPY;  Service: Endoscopy;  Laterality: N/A;   ESOPHAGOGASTRODUODENOSCOPY (EGD) WITH PROPOFOL N/A 08/21/2019   Procedure: ESOPHAGOGASTRODUODENOSCOPY (EGD) WITH PROPOFOL;  Surgeon: Napoleon Form, MD;  Location: [facility] ENDOSCOPY;  Service: Endoscopy;  Laterality: N/A;   ESOPHAGOGASTRODUODENOSCOPY (EGD) WITH PROPOFOL N/A 11/17/2021   Procedure: ESOPHAGOGASTRODUODENOSCOPY (EGD) WITH PROPOFOL;  Surgeon: Lynann Bologna, MD;  Location: [facility] ENDOSCOPY;  Service: Gastroenterology;  Laterality: N/A;   FRACTURE SURGERY Left 2011   arm   UPPER GASTROINTESTINAL ENDOSCOPY      Family Hx:  Family History  Problem Relation Age of Onset   Diabetes Father    Colon cancer Neg Hx    Esophageal cancer Neg Hx    Rectal cancer Neg Hx    Stomach cancer Neg Hx     Social History:  reports that she has been smoking cigarettes. She has been smoking an average of 1.00 packs per day. She has been exposed to tobacco smoke. She has never used smokeless tobacco. She reports that she does not currently use alcohol. She reports current drug use. Drug: Marijuana.  Allergies:  Allergies  Allergen Reactions   Zestril [Lisinopril] Other (See Comments)    Hyperkalemia    Medications: Prior to Admission medications    Medication Sig Start Date End Date Taking? Authorizing Provider  acetaminophen (TYLENOL) 500 MG tablet Take 1,000 mg by mouth 2 (two) times daily as needed for moderate pain, fever or headache.   Yes [provider]  albuterol (VENTOLIN HFA) 108 (90 Base) MCG/ACT inhaler INHALE 2 PUFFS INTO THE LUNGS EVERY 6 HOURS AS NEEDED FOR WHEEZING OR SHORTNESS OF BREATH. 01/21/22  Yes Claiborne Rigg, NP  amLODipine (NORVASC) 10 MG tablet Take 1 tablet (10 mg total) by mouth daily. 08/06/22  Yes Dahal, Melina Schools, MD  busPIRone (BUSPAR) 10 MG tablet Take 1 tablet (10 mg total) by mouth 2 (two) times daily. 01/21/22  Yes Claiborne Rigg, NP  ferrous sulfate 325 (65 FE) MG tablet Take 1 tablet (325 mg total) by mouth daily with breakfast. 08/05/22  Yes Dahal, Melina Schools, MD  Fluticasone-Umeclidin-Vilant (TRELEGY ELLIPTA) 100-62.5-25 MCG/ACT AEPB Inhale 1 puff into the lungs daily. 01/21/22  Yes Claiborne Rigg, NP  gabapentin (NEURONTIN) 100 MG capsule Take 1 capsule (100 mg total) by mouth 3 (three) times daily. 01/21/22  Yes Claiborne Rigg, NP  ipratropium-albuterol (DUONEB) 0.5-2.5 (3) MG/3ML SOLN Take 3 mLs by nebulization every 6 (six) hours as needed (shortness of breath and wheezing). Patient taking differently: Take 3 mLs by nebulization every 6 (six) hours as needed (shortness of breath, wheezing). 01/21/22  Yes Claiborne Rigg, NP  Multiple Vitamin (MULTIVITAMIN WITH MINERALS) TABS tablet Take 1 tablet by mouth daily. 06/29/20  Yes [name], MD  oxyCODONE-acetaminophen (PERCOCET/ROXICET) 5-325 MG tablet Take 1 tablet by mouth every 8 (eight) hours as needed for severe pain or moderate pain. 08/05/22  Yes Dahal, Melina Schools, MD  pantoprazole (PROTONIX) 40 MG tablet Take 1 tablet (40 mg total) by mouth daily. 08/05/22  Yes Dahal, Melina Schools, MD  sodium bicarbonate  650 MG tablet Take 2 tablets (1,300 mg total) by mouth 3 (three) times daily. 01/25/22  Yes   traZODone (DESYREL) 50 MG tablet Take 1 tablet (50  mg total) by mouth at bedtime as needed for sleep. 01/21/22  Yes Claiborne Rigg, NP    I have reviewed the patient's current medications.  Labs:  Results for orders placed or performed during the hospital encounter of 08/12/22 (from the past 48 hour(s))  Comprehensive metabolic panel     Status: Abnormal   Collection Time: 08/12/22  5:08 PM  Result Value Ref Range   Sodium 132 (L) 135 - 145 mmol/L   Potassium 3.4 (L) 3.5 - 5.1 mmol/L   Chloride 103 98 - 111 mmol/L   CO2 14 (L) 22 - 32 mmol/L   Glucose, Bld 136 (H) 70 - 99 mg/dL    Comment: Glucose reference range applies only to samples taken after fasting for at least 8 hours.   BUN 58 (H) 8 - 23 mg/dL   Creatinine, Ser 1.61 (H) 0.44 - 1.00 mg/dL   Calcium 7.7 (L) 8.9 - 10.3 mg/dL   Total Protein 7.1 6.5 - 8.1 g/dL   Albumin 2.2 (L) 3.5 - 5.0 g/dL   AST 16 15 - 41 U/L   ALT 11 0 - 44 U/L   Alkaline Phosphatase 77 38 - 126 U/L   Total Bilirubin 0.6 0.3 - 1.2 mg/dL   GFR, Estimated 10 (L) >60 mL/min    Comment: (NOTE) Calculated using the CKD-EPI Creatinine Equation (2021)    Anion gap 15 5 - 15    Comment: Performed at [facility], [address] [place], [place] [address]  CBC with Differential     Status: Abnormal   Collection Time: 08/12/22  5:08 PM  Result Value Ref Range   WBC 7.3 4.0 - 10.5 K/uL   RBC 2.26 (L) 3.87 - 5.11 MIL/uL   Hemoglobin 7.0 (L) 12.0 - 15.0 g/dL    Comment: REPEATED TO VERIFY   HCT 22.0 (L) 36.0 - 46.0 %   MCV 97.3 80.0 - 100.0 fL   MCH 31.0 26.0 - 34.0 pg   MCHC 31.8 30.0 - 36.0 g/dL   RDW 54.0 (H) 98.1 - 19.1 %   Platelets 371 150 - 400 K/uL    Comment: REPEATED TO VERIFY   nRBC 0.4 (H) 0.0 - 0.2 %   Neutrophils Relative % 69 %   Neutro Abs 5.0 1.7 - 7.7 K/uL   Lymphocytes Relative 15 %   Lymphs Abs 1.1 0.7 - 4.0 K/uL   Monocytes Relative 8 %   Monocytes Absolute 0.6 0.1 - 1.0 K/uL   Eosinophils Relative 5 %   Eosinophils Absolute 0.4 0.0 - 0.5 K/uL   Basophils Relative 1 %    Basophils Absolute 0.1 0.0 - 0.1 K/uL   Immature Granulocytes 2 %   Abs Immature Granulocytes 0.14 (H) 0.00 - 0.07 K/uL    Comment: Performed at [facility], [address], [place], [place] [address]  Culture, blood (Routine x 2)     Status: None (Preliminary result)   Collection Time: 08/12/22  5:08 PM   Specimen: BLOOD RIGHT ARM  Result Value Ref Range   Specimen Description BLOOD RIGHT ARM    Special Requests      BOTTLES DRAWN AEROBIC AND ANAEROBIC Blood Culture results may not be optimal due to an inadequate volume of blood received in culture bottles   Culture  NO GROWTH < 24 HOURS Performed at [facility], [address], [place], [place] [address]    Report Status PENDING   Brain natriuretic peptide     Status: Abnormal   Collection Time: 08/12/22  5:10 PM  Result Value Ref Range   B Natriuretic Peptide 1,084.9 (H) 0.0 - 100.0 pg/mL    Comment: Performed at [facility], [address], [place], [place] [address]  Culture, blood (Routine x 2)     Status: None (Preliminary result)   Collection Time: 08/12/22  5:13 PM   Specimen: BLOOD  Result Value Ref Range   Specimen Description BLOOD LEFT ANTECUBITAL    Special Requests      BOTTLES DRAWN AEROBIC AND ANAEROBIC Blood Culture adequate volume   Culture      NO GROWTH < 24 HOURS Performed at [facility], [address] [place], [place] [address]    Report Status PENDING   Lactic acid, plasma     Status: Abnormal   Collection Time: 08/12/22  5:20 PM  Result Value Ref Range   Lactic Acid, Venous 2.1 (HH) 0.5 - 1.9 mmol/L    Comment: CRITICAL RESULT CALLED TO, READ BACK BY AND VERIFIED WITH E ANELLO,RN 1825 08/12/2022 WBOND Performed at [facility] Lab, [address] [place], [place] [address]   Lactic acid, plasma     Status: None   Collection Time: 08/12/22  9:40 PM  Result Value Ref Range   Lactic Acid, Venous 0.8 0.5 - 1.9 mmol/L    Comment: Performed at [facility],  [address] [place], [place] [address]  Protime-INR     Status: None   Collection Time: 08/12/22  9:40 PM  Result Value Ref Range   Prothrombin Time 14.1 11.4 - 15.2 seconds   INR 1.1 0.8 - 1.2    Comment: (NOTE) INR goal varies based on device and disease states. Performed at [facility], [address] [place], [place] [address]   Magnesium     Status: None   Collection Time: 08/12/22  9:40 PM  Result Value Ref Range   Magnesium 1.9 1.7 - 2.4 mg/dL    Comment: Performed at [facility], [address], [place], [place] [address]  Comprehensive metabolic panel     Status: Abnormal   Collection Time: 08/13/22 12:39 AM  Result Value Ref Range   Sodium 132 (L) 135 - 145 mmol/L   Potassium 4.0 3.5 - 5.1 mmol/L   Chloride 102 98 - 111 mmol/L   CO2 15 (L) 22 - 32 mmol/L   Glucose, Bld 149 (H) 70 - 99 mg/dL    Comment: Glucose reference range applies only to samples taken after fasting for at least 8 hours.   BUN 60 (H) 8 - 23 mg/dL   Creatinine, Ser 9.62 (H) 0.44 - 1.00 mg/dL   Calcium 7.6 (L) 8.9 - 10.3 mg/dL   Total Protein 6.7 6.5 - 8.1 g/dL   Albumin 2.1 (L) 3.5 - 5.0 g/dL   AST 16 15 - 41 U/L   ALT 10 0 - 44 U/L   Alkaline Phosphatase 73 38 - 126 U/L   Total Bilirubin 0.4 0.3 - 1.2 mg/dL   GFR, Estimated 10 (L) >60 mL/min    Comment: (NOTE) Calculated using the CKD-EPI Creatinine Equation (2021)    Anion gap 15 5 - 15    Comment: Performed at [facility] Lab, [address] [place], [place] [address]  CBC     Status: Abnormal   Collection Time: 08/13/22 12:39 AM  Result Value Ref Range   WBC 6.5 4.0 - 10.5 K/uL   RBC 2.21 (L) 3.87 - 5.11 MIL/uL   Hemoglobin 6.6 (LL) 12.0 - 15.0 g/dL    Comment: REPEATED TO VERIFY THIS CRITICAL RESULT HAS VERIFIED AND BEEN CALLED TO S BRISTOW RN BY JALEESA WHITE ON 05 28 2024 AT 0105, AND HAS BEEN READ BACK.     HCT 20.4 (L) 36.0 - 46.0 %   MCV 92.3 80.0 - 100.0 fL   MCH 29.9 26.0 - 34.0 pg   MCHC 32.4 30.0 - 36.0  g/dL   RDW 60.4 (H) 54.0 - 98.1 %   Platelets 363 150 - 400 K/uL   nRBC 0.0 0.0 - 0.2 %    Comment: Performed at [facility], [address], [place], [place] [address]  Type and screen [facility]     Status: None (Preliminary result)   Collection Time: 08/13/22  3:51 AM  Result Value Ref Range   ABO/RH(D) A POS    Antibody Screen NEG    Sample Expiration 08/16/2022,2359    Unit Number [id number]    Blood Component Type RED CELLS,LR    Unit division 00    Status of Unit ISSUED    Transfusion Status OK TO TRANSFUSE    Crossmatch Result      Compatible Performed at [facility], [address], [place], [place] [address]   Prepare RBC (crossmatch)     Status: None   Collection Time: 08/13/22  9:00 AM  Result Value Ref Range   Order Confirmation      ORDER PROCESSED BY BLOOD BANK Performed at [facility], [address], [place], [place] [address]   Iron and TIBC     Status: Abnormal   Collection Time: 08/13/22  9:52 AM  Result Value Ref Range   Iron 21 (L) 28 - 170 ug/dL   TIBC 413 244 - 010 ug/dL   Saturation Ratios 8 (L) 10.4 - 31.8 %   UIBC 259 ug/dL    Comment: Performed at [facility], [address] [place], [place] [address]  Ferritin     Status: None   Collection Time: 08/13/22  9:52 AM  Result Value Ref Range   Ferritin 160 11 - 307 ng/mL    Comment: Performed at [facility], [address], [place], [place] [address]  Reticulocytes     Status: Abnormal   Collection Time: 08/13/22  9:52 AM  Result Value Ref Range   Retic Ct Pct 2.2 0.4 - 3.1 %   RBC. 2.29 (L) 3.87 - 5.11 MIL/uL   Retic Count, Absolute 51.1 19.0 - 186.0 K/uL   Immature Retic Fract 35.4 (H) 2.3 - 15.9 %    Comment: Performed at [facility], [address] [place], [place] [address]     ROS:  Pertinent items are noted in HPI.  Tarry stools, edema, sob, itching, painful skin   Physical Exam: Vitals:   08/13/22 1018 08/13/22  1043  BP: (!) 157/70 (!) 146/74  Pulse: 90 99  Resp: 13 16  Temp: 98.4 F (36.9 C) 98 F (36.7 C)  SpO2: 91% 94%     General:  pale, difficult mobility due to SOB, weakness  HEENT: PERRLA, EOMI, mucous membranes moist   Neck: positive  for JVD Heart: RRR Lungs: CBS bilat also dec air movement Abdomen: soft, non tender Extremities: pitting edema Skin: warm and dry but painful to touch Neuro: alert, somewhat hysterical   Assessment/Plan: 62 year old WF with history of substance abuse, HTN, hep C and poor social situation with progressive CKD-  2 hosps in the past 2 weeks , really failure to thrive 1.Renal- progressive CKD really unknown etiology but also incomplete CKD care due to social situation-  progressing to ESRD.  She has had 2 hosps in the past 2 weeks-  cannot maintain her hgb or volume status with albumin of 2.1 and many physical sxms.  I told her that I thought it would be in her best interest to start dialysis " I am ready"  she is tearful about her future.  There are no urgent indications-  have called [facility] to maybe place AVG and TDC at the same time for her convenience and start dialysis once [facility] is in -  either 5/29 or 5/30 -  will also consult our team to arrange OP HD.  She did have a negative hep B surface antigen on 5/15 at [facility]  2. Hypertension/volume  - overloaded-  is making some urine-  will maximize diuretics and UF as able with HD  3. Metabolic acidosis-  due to CKD- on bicarb-  dialysis will correct  4. Anemia  - severe and recurrent-  CKD is a factor but may be GI bleeding as well -  will give esa and iron   [name] 08/13/2022, 11:15 AM

## 2022-08-13 NOTE — Progress Notes (Signed)
Patient seen and examined personally, I reviewed the chart, history and physical and admission note, done by admitting physician this morning and agree with the same with following addendum.  Please refer to the morning admission note for more detailed plan of care.  Briefly,  62 year old female with complex medical comorbidities with PAD GERD gastritis, hypertension, anemia, CKD 5, COPD, hepatitis C, Hyperlipidemia, recent admission [phone] for symptomatic anemia due to chronic GI bleeding needing 2 units PRBC, FOBT was negative, presenting with fluid retention shortness of breath, in the triage initially hypoxic 80%, noted to have anasarca in legs arms and abdomen. In the ED mildly tachycardic, he did not with nasal cannula, BP on higher side 160s to 170s mild tachypnea-24 Labs creatinine elevated 4.19 hemoglobin 7.1 g> 7.0 g, lactic acid 2.1> 0.8, BNP elevated 1084. Chest x-ray cardiomegaly and pulmonary edema with diffuse bilateral interstitial pulmonary opacities right greater than left small pleural effusion. Patient admitted working diagnosis of acute hypoxic aspiratory failure pretension in the setting of CHF exacerbation possible COPD exacerbation. Her baseline weight went has gone up but she is note sure how much. HH overnight at 6.6 g,   This am on [place] 2L not on home o2  She feels she is calming down. Legs are hurting, mildly swollen. She stays her "legs and stomach were swollen bad yesterday" today it is starting to go down. C/o chest tightness, leg pain stomach pain. C/o coughing,wheezing. Still smokes cigarette. Lives alone. Feels weak  On exam wheezy and mildly edema in lower extremities but sensitive to touch, has multiple complaints including soreness on her bottom  Acute hypoxic respiratory failure: Likely multifactorial in the setting of acute on chronic diastolic CHF, possible septic exacerbation ongoing smoking, wheezing.  Continue supportive oxygen, bronchodilators  Lasix  CKD stage V-creatinine holding around baseline IN 4S. Nephrology consulted monitor renal function as below Recent Labs    06/06/22 1410 06/07/22 1009 06/07/22 1615 08/01/22 1308 08/02/22 0450 08/03/22 0204 08/04/22 0207 08/05/22 0149 08/12/22 1708 08/13/22 0039  BUN 37* 34* 37* 37* 35* 38* 42* 47* 58* 60*  CREATININE 4.14* 4.07* 3.97* 4.71* 4.18* 4.39* 4.22* 4.19* 4.68* 4.78*  CO2 13* 10* 12* 16* 16* 18* 17* 16* 14* 15*    Acute on chronic diastolic CHF: Chest x-ray with pulmonary edema and bilateral small effusions and cardiomegaly BNP elevated 1084.  Given patient's CKD will consult nephrology for help with diuresis.  Echo from 1223 EF 55-60% with G1 DD on Lasix previously but was held due to AKI/dehydration. Cont to monitor daily I/O, daly weight ( changes as beow), monitor electrolytes and net balance as below. Keep on 2 gm salt and fluid restricted diet.Net IO Since Admission: 603 mL [08/13/22 1102]  Filed Weights   08/12/22 1710 08/12/22 2000 08/13/22 0500  Weight: 53.5 kg 64.5 kg 63.3 kg    Recent Labs  Lab 08/12/22 1708 08/12/22 1710 08/12/22 2140 08/13/22 0039  BNP  --  1,084.9*  --   --   BUN 58*  --   --  60*  CREATININE 4.68*  --   --  4.78*  K 3.4*  --   --  4.0  MG  --   --  1.9  --     Possible COPD exacerbation with wheezing: Current active smoker:  Anemia multifactorial likely from anemia of chronic kidney disease PUD/gastritis/GERD and diverticulosis: Monitor reviewed continue PPI Home iron therapy.  Transfusing 1 unit PRBC again today given drop in hemoglobin as below. Has had  anemia panel done recently with low iron storage. Check hemoccul again. Hold heaprin sq, cont ppi. Recent Labs    11/15/21 1228 11/15/21 1747 08/03/22 0204 08/04/22 0207 08/04/22 1023 08/05/22 0149 08/12/22 1708 08/13/22 0039 08/13/22 0952  HGB  --    < > 7.4* 7.1*  --  7.1* 7.0* 6.6*  --   MCV  --    < > 93.0 94.1  --  95.3 97.3 92.3  --   FERRITIN 11  --   --    --  39  --   --   --   --   TIBC 449  --   --   --   --   --   --   --   --   IRON 14*  --   --   --   --   --   --   --   --   RETICCTPCT  --   --   --   --   --   --   --   --  2.2   < > = values in this interval not displayed.    ZOX:WRUEAVW home amlodipine in setting of initiation of Lasix as above Substance UJW:[id number] currently using marijuana only   Depression Anxiety: Continue home BuSpar and trazodone   Neuropathy:Continue home Gabapentin

## 2022-08-13 NOTE — Hospital Course (Addendum)
62 year old female with complex medical comorbidities with PAD GERD gastritis, hypertension, anemia, CKD 5, COPD, hepatitis C, Hyperlipidemia, recent admission [phone] for symptomatic anemia due to chronic GI bleeding needing 2 units PRBC, FOBT was negative, presenting with fluid retention shortness of breath, in the triage initially hypoxic 80%, noted to have anasarca in legs arms and abdomen. In the ED mildly tachycardic, he did not with nasal cannula, BP on higher side 160s to 170s mild tachypnea-24 Labs creatinine elevated 4.19 hemoglobin 7.1 g> 7.0 g, lactic acid 2.1> 0.8, BNP elevated 1084. Chest x-ray cardiomegaly and pulmonary edema with diffuse bilateral interstitial pulmonary opacities right greater than left small pleural effusion. Patient admitted working diagnosis of acute hypoxic aspiratory failure pretension in the setting of CHF exacerbation possible COPD exacerbation. Her baseline weight went has gone up but she is note sure how much. HH overnight at 6.6 g> received 1 unit PRBC transfusion 5/28. Closely followed by nephrology, given the fluid overload underwent TDC placement and dialysis started. She had been having worsening hypoxia ever since her [facility] placement imaging with fluid overload, pulmonary critical care has been consulted 5/30.  Patient still has been updated palliative care was also consulted 5/30 patient and daughter requestied PMT to change to DNR S/p SBE:5/31: 9 AVMs noted. 1 with evidence of recent stigmata of bleeding.  GI advised to continue PPI and outpatient follow-up

## 2022-08-13 NOTE — Progress Notes (Signed)
  Echocardiogram 2D Echocardiogram has been performed.  [name] 08/13/2022, 11:34 AM

## 2022-08-13 NOTE — Plan of Care (Signed)
  Problem: Education: Goal: Ability to demonstrate management of disease process will improve Outcome: Progressing Goal: Ability to verbalize understanding of medication therapies will improve Outcome: Progressing   Problem: Activity: Goal: Capacity to carry out activities will improve Outcome: Progressing   Problem: Cardiac: Goal: Ability to achieve and maintain adequate cardiopulmonary perfusion will improve Outcome: Progressing   Problem: Education: Goal: Knowledge of General Education information will improve Description: Including pain rating scale, medication(s)/side effects and non-pharmacologic comfort measures Outcome: Progressing   Problem: Health Behavior/Discharge Planning: Goal: Ability to manage health-related needs will improve Outcome: Progressing   Problem: Clinical Measurements: Goal: Respiratory complications will improve Outcome: Progressing Goal: Cardiovascular complication will be avoided Outcome: Progressing   Problem: Activity: Goal: Risk for activity intolerance will decrease Outcome: Progressing   Problem: Nutrition: Goal: Adequate nutrition will be maintained Outcome: Progressing   Problem: Elimination: Goal: Will not experience complications related to bowel motility Outcome: Progressing   Problem: Pain Managment: Goal: General experience of comfort will improve Outcome: Progressing   Problem: Safety: Goal: Ability to remain free from injury will improve Outcome: Progressing   Problem: Skin Integrity: Goal: Risk for impaired skin integrity will decrease Outcome: Progressing   Problem: Education: Goal: Ability to demonstrate management of disease process will improve Outcome: Progressing Goal: Ability to verbalize understanding of medication therapies will improve Outcome: Progressing   Problem: Activity: Goal: Capacity to carry out activities will improve Outcome: Progressing   Problem: Cardiac: Goal: Ability to achieve and  maintain adequate cardiopulmonary perfusion will improve Outcome: Progressing   Problem: Education: Goal: Knowledge of disease and its progression will improve Outcome: Progressing

## 2022-08-13 NOTE — Consult Note (Addendum)
CONSULT NOTE   MRN : [id number]  Reason for Consult: ESRD needs permanent access and [facility] within the next few days per nephrology. Referring Physician: Kathrene Bongo   History of Present Illness: this is a 62 y/o female that was seen in our office by DR. Clark on 02/19/22 for evaluation of permanent access.   Her vein mapping demonstrated samll surface veins  per DR. Clarkd note.  She is followed by Dr. Gust Brooms with [facility].  No chest wall implants.   Past medical history includes:  hypertension, hyperlipidemia, polysubstance abuse, hepatitis C      Current Facility-Administered Medications  Medication Dose Route Frequency Provider Last Rate Last Admin   acetaminophen (TYLENOL) tablet 650 mg  650 mg Oral Q6H PRN Synetta Fail, MD   650 mg at 08/12/22 2050   Or   acetaminophen (TYLENOL) suppository 650 mg  650 mg Rectal Q6H PRN Synetta Fail, MD       busPIRone (BUSPAR) tablet 10 mg  10 mg Oral BID Synetta Fail, MD   10 mg at 08/13/22 4098   ferrous sulfate tablet 325 mg  325 mg Oral Q breakfast Synetta Fail, MD   325 mg at 08/13/22 0859   fluticasone furoate-vilanterol (BREO ELLIPTA) 100-25 MCG/ACT 1 puff  1 puff Inhalation Daily Synetta Fail, MD       And   umeclidinium bromide (INCRUSE ELLIPTA) 62.5 MCG/ACT 1 puff  1 puff Inhalation Daily Synetta Fail, MD       furosemide (LASIX) injection 20 mg  20 mg Intravenous PRN Kc, Dayna Barker, MD       furosemide (LASIX) injection 80 mg  80 mg Intravenous BID Synetta Fail, MD   80 mg at 08/13/22 0900   gabapentin (NEURONTIN) capsule 100 mg  100 mg Oral TID Synetta Fail, MD   100 mg at 08/13/22 0859   ipratropium (ATROVENT) nebulizer solution 0.5 mg  0.5 mg Nebulization Q4H PRN Synetta Fail, MD       liver oil-zinc oxide (DESITIN) 40 % ointment   Topical BID Kc, Ramesh, MD       oxyCODONE-acetaminophen (PERCOCET/ROXICET) 5-325 MG per tablet 1 tablet  1 tablet Oral  Q8H PRN Kc, Ramesh, MD   1 tablet at 08/13/22 1050   pantoprazole (PROTONIX) EC tablet 40 mg  40 mg Oral Daily Synetta Fail, MD   40 mg at 08/13/22 0859   polyethylene glycol (MIRALAX / GLYCOLAX) packet 17 g  17 g Oral Daily PRN Synetta Fail, MD       sodium bicarbonate tablet 1,300 mg  1,300 mg Oral TID Synetta Fail, MD   1,300 mg at 08/13/22 0859   sodium chloride flush (NS) 0.9 % injection 3 mL  3 mL Intravenous Q12H Synetta Fail, MD   3 mL at 08/13/22 0900   traZODone (DESYREL) tablet 50 mg  50 mg Oral QHS PRN Synetta Fail, MD   50 mg at 08/12/22 2233    Pt meds include: Statin :No Betablocker: No ASA: No Other anticoagulants/antiplatelets: none  Past Medical History:  Diagnosis Date   Alcohol abuse    Allergy    Anxiety    Arthritis    Asthma    Cannabis abuse    Cocaine abuse (HCC)    COPD (chronic obstructive pulmonary disease) (HCC)    Depression    Drug addiction (HCC)    GERD (gastroesophageal reflux disease)  Heart murmur    Homelessness    Hyperlipidemia    Hypertension    pt stated "every once in a while BP will be high but has not been prescribed medication for HTN.    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) 02/22/2016    Past Surgical History:  Procedure Laterality Date   BIOPSY  01/01/2019   Procedure: BIOPSY;  Surgeon: Beverley Fiedler, MD;  Location: [facility] ENDOSCOPY;  Service: Endoscopy;;   BIOPSY  11/17/2021   Procedure: BIOPSY;  Surgeon: Lynann Bologna, MD;  Location: [facility] ENDOSCOPY;  Service: Gastroenterology;;   CESAREAN SECTION  1989   COLONOSCOPY  11/07/2020   2018   CYSTOSCOPY W/ URETERAL STENT PLACEMENT Left 08/01/2018   Procedure: CYSTOSCOPY WITH RETROGRADE PYELOGRAM/URETERAL STENT PLACEMENT;  Surgeon: Crista Elliot, MD;  Location: [facility] ORS;  Service: Urology;  Laterality: Left;    CYSTOSCOPY WITH RETROGRADE PYELOGRAM, URETEROSCOPY AND STENT PLACEMENT Left 01/15/2019   Procedure: CYSTOSCOPY WITH RETROGRADE PYELOGRAM, URETEROSCOPY AND STENT PLACEMENT;  Surgeon: Crista Elliot, MD;  Location: [facility] ORS;  Service: Urology;  Laterality: Left;   ESOPHAGOGASTRODUODENOSCOPY (EGD) WITH PROPOFOL N/A 01/01/2019   Procedure: ESOPHAGOGASTRODUODENOSCOPY (EGD) WITH PROPOFOL;  Surgeon: Beverley Fiedler, MD;  Location: [facility] ENDOSCOPY;  Service: Endoscopy;  Laterality: N/A;   ESOPHAGOGASTRODUODENOSCOPY (EGD) WITH PROPOFOL N/A 08/21/2019   Procedure: ESOPHAGOGASTRODUODENOSCOPY (EGD) WITH PROPOFOL;  Surgeon: Napoleon Form, MD;  Location: [facility] ENDOSCOPY;  Service: Endoscopy;  Laterality: N/A;   ESOPHAGOGASTRODUODENOSCOPY (EGD) WITH PROPOFOL N/A 11/17/2021   Procedure: ESOPHAGOGASTRODUODENOSCOPY (EGD) WITH PROPOFOL;  Surgeon: Lynann Bologna, MD;  Location: [facility] ENDOSCOPY;  Service: Gastroenterology;  Laterality: N/A;   FRACTURE SURGERY Left 2011   arm   UPPER GASTROINTESTINAL ENDOSCOPY      Social History Social History   Tobacco Use   Smoking status: Every Day    Packs/day: 1.00    Years: 0.00    Additional pack years: 0.00    Total pack years: 0.00    Types: Cigarettes    Passive exposure: Past   Smokeless tobacco: Never  Vaping Use   Vaping Use: Never used  Substance Use Topics   Alcohol use: Not Currently    Comment: 1 month PTA   Drug use: Yes    Types: Marijuana    Family History Family History  Problem Relation Age of Onset   Diabetes Father    Colon cancer Neg Hx    Esophageal cancer Neg Hx    Rectal cancer Neg Hx    Stomach cancer Neg Hx     Allergies  Allergen Reactions   Zestril [Lisinopril] Other (See Comments)    Hyperkalemia     REVIEW OF SYSTEMS  General: [ ]  Weight loss, [ ]  Fever, [ ]  chills Neurologic: [ ]  Dizziness, [ ]  Blackouts, [ ]  Seizure [ ]  Stroke, [ ]  "Mini stroke", [ ]  Slurred speech, [ ]  Temporary blindness; [ ]  weakness in arms or legs,  [ ]  Hoarseness [ ]  Dysphagia Cardiac: [ ]  Chest pain/pressure, [ ]  Shortness of breath at rest [ ]  Shortness of breath with exertion, [ ]  Atrial fibrillation or irregular heartbeat  Vascular: [ ]  Pain in legs with walking, [ ]  Pain in legs at rest, [ ]  Pain in legs at night,  [ ]  Non-healing ulcer, [ ]  Blood clot in vein/DVT,  Pulmonary: [ ]  Home oxygen, [ ]  Productive cough, [ ]  Coughing up blood, [ ]  Asthma,  [ ]  Wheezing [ ]  COPD Musculoskeletal:  [ ]  Arthritis, [ ]  Low back pain, [ ]  Joint pain Hematologic: [ ]  Easy Bruising, [ ]  Anemia; [ ]  Hepatitis Gastrointestinal: [ ]  Blood in stool, [ ]  Gastroesophageal Reflux/heartburn, Urinary: [x]  chronic Kidney disease, [ ]  on HD - [ ]  MWF or [ ]  TTHS, [ ]  Burning with urination, [ ]  Difficulty urinating Skin: [ ]  Rashes, [ ]  Wounds Psychological: [ ]  Anxiety, [ ]  Depression  Physical Examination Vitals:   08/13/22 0500 08/13/22 0718 08/13/22 1018 08/13/22 1043  BP:  (!) 159/93 (!) 157/70 (!) 146/74  Pulse:  98 90 99  Resp:  18 13 16   Temp:  98 F (36.7 C) 98.4 F (36.9 C) 98 F (36.7 C)  TempSrc:  Oral Oral Oral  SpO2:  93% 91% 94%  Weight: 63.3 kg     Height:       Body mass index is 21.85 kg/m.  General:  WDWN in NAD Gait: Normal HENT: WNL Eyes: Pupils equal Pulmonary: normal non-labored breathing , without Rales, rhonchi,  wheezing Cardiac: RRR, without  Murmurs, rubs or gallops; No carotid bruits Abdomen: soft, NT, no masses Skin: no rashes, ulcers noted;  no Gangrene , no cellulitis; no open wounds;   Vascular Exam/Pulses:palpable radial pulses B UE   Musculoskeletal: no muscle wasting or atrophy; no edema  Neurologic: A&O X 3; Appropriate Affect ;  SENSATION: normal; MOTOR FUNCTION: 5/5 Symmetric Speech is fluent/normal   Significant Diagnostic Studies: CBC Lab Results  Component Value Date   WBC 6.5 08/13/2022   HGB 6.6 (LL) 08/13/2022   HCT 20.4 (L) 08/13/2022   MCV 92.3 08/13/2022   PLT 363  08/13/2022    BMET    Component Value Date/Time   NA 132 (L) 08/13/2022 0039   NA 137 06/06/2022 1410   K 4.0 08/13/2022 0039   CL 102 08/13/2022 0039   CO2 15 (L) 08/13/2022 0039   GLUCOSE 149 (H) 08/13/2022 0039   BUN 60 (H) 08/13/2022 0039   BUN 37 (H) 06/06/2022 1410   CREATININE 4.78 (H) 08/13/2022 0039   CREATININE 1.64 (H) 04/29/2016 1047   CALCIUM 7.6 (L) 08/13/2022 0039   GFRNONAA 10 (L) 08/13/2022 0039   GFRNONAA 35 (L) 04/29/2016 1047   GFRAA 36 (L) 08/31/2019 1230   GFRAA 40 (L) 04/29/2016 1047   Estimated Creatinine Clearance: 12 mL/min (A) (by C-G formula based on SCr of 4.78 mg/dL (H)).  COAG Lab Results  Component Value Date   INR 1.1 08/12/2022   INR 1.0 07/31/2018   INR 0.98 06/07/2016    04/22/21 vein mapping  Non-Invasive Vascular Imaging:   +--------------+-------------+----------+--------+  Right CephalicDiameter (cm)Depth (cm)Findings  +--------------+-------------+----------+--------+  Prox upper arm    0.15                         +--------------+-------------+----------+--------+  Dist upper arm    0.60                         +--------------+-------------+----------+--------+  Prox forearm      0.15                         +--------------+-------------+----------+--------+  Mid forearm       0.12                         +--------------+-------------+----------+--------+  Dist forearm      0.21                         +--------------+-------------+----------+--------+   +-----------------+-------------+----------+--------+  Right Basilic    Diameter (cm)Depth (cm)Findings  +-----------------+-------------+----------+--------+  Mid upper arm        0.19                         +-----------------+-------------+----------+--------+  Dist upper arm       0.19                         +-----------------+-------------+----------+--------+  Antecubital fossa    0.22                          +-----------------+-------------+----------+--------+   +-----------------+-------------+----------+--------+  Left Cephalic    Diameter (cm)Depth (cm)Findings  +-----------------+-------------+----------+--------+  Prox upper arm       0.13                         +-----------------+-------------+----------+--------+  Mid upper arm        0.16                         +-----------------+-------------+----------+--------+  Dist upper arm       0.16                         +-----------------+-------------+----------+--------+  Antecubital fossa    0.14                         +-----------------+-------------+----------+--------+  Prox forearm         0.19                         +-----------------+-------------+----------+--------+  Mid forearm          0.20                         +-----------------+-------------+----------+--------+  Dist forearm         0.16                         +-----------------+-------------+----------+--------+   +-----------------+-------------+----------+---------+  Left Basilic     Diameter (cm)Depth (cm)Findings   +-----------------+-------------+----------+---------+  Mid upper arm        0.28                          +-----------------+-------------+----------+---------+  Dist upper arm       0.28                          +-----------------+-------------+----------+---------+  Antecubital fossa    0.16               branching  +-----------------+-------------+----------+---------+   Summary: Right: Patent cephalic and basilic veins.  Left: Patent cephalic and basilic veins.   ASSESSMENT/PLAN:  AKI on CKD now ESRD She is right hand dominant.  We will plan right UE av fistula verse graft and TDC placement.  The patient agrees to proceed.   Plan for surgery tomorrow.  NPO past MN and will ask maintain IV access  on left UE.  Preserve right UE.   Mosetta Pigeon 08/13/2022 12:03  PM   I have independently interviewed and examined patient and agree with PA assessment and plan above.  She is right-hand dominant but has also had significant injury to the left upper extremity in the past.  There is an IV present on the left side as such we will leave the IV in place and plan for right arm AV fistula versus graft with tunneled dialysis catheter tomorrow in the OR.  She will be n.p.o. past midnight.    We discussed the risks, benefits, and alternatives as well as possible need for future procedures and she demonstrates good understanding.  Iris Tatsch C. Randie Heinz, MD [facility] Office: [phone] Pager: [phone]
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
## 2022-08-14 ENCOUNTER — Encounter (HOSPITAL_COMMUNITY): Payer: Self-pay | Admitting: Internal Medicine

## 2022-08-14 ENCOUNTER — Inpatient Hospital Stay (HOSPITAL_COMMUNITY): Payer: Medicare Other | Admitting: Anesthesiology

## 2022-08-14 ENCOUNTER — Inpatient Hospital Stay (HOSPITAL_COMMUNITY): Payer: Medicare Other

## 2022-08-14 ENCOUNTER — Other Ambulatory Visit: Payer: Self-pay

## 2022-08-14 ENCOUNTER — Encounter (HOSPITAL_COMMUNITY)
Admission: EM | Disposition: A | Discharge status: Home / Self Care | Payer: Self-pay | Source: Home / Self Care | Attending: Internal Medicine

## 2022-08-14 ENCOUNTER — Encounter: Payer: Self-pay | Admitting: Gastroenterology

## 2022-08-14 DIAGNOSIS — N186 End stage renal disease: Secondary | ICD-10-CM

## 2022-08-14 DIAGNOSIS — R195 Other fecal abnormalities: Secondary | ICD-10-CM

## 2022-08-14 DIAGNOSIS — D649 Anemia, unspecified: Secondary | ICD-10-CM

## 2022-08-14 DIAGNOSIS — Z8619 Personal history of other infectious and parasitic diseases: Secondary | ICD-10-CM

## 2022-08-14 DIAGNOSIS — J9691 Respiratory failure, unspecified with hypoxia: Secondary | ICD-10-CM

## 2022-08-14 DIAGNOSIS — E1122 Type 2 diabetes mellitus with diabetic chronic kidney disease: Secondary | ICD-10-CM

## 2022-08-14 DIAGNOSIS — Z992 Dependence on renal dialysis: Secondary | ICD-10-CM

## 2022-08-14 DIAGNOSIS — I120 Hypertensive chronic kidney disease with stage 5 chronic kidney disease or end stage renal disease: Secondary | ICD-10-CM

## 2022-08-14 DIAGNOSIS — F17210 Nicotine dependence, cigarettes, uncomplicated: Secondary | ICD-10-CM

## 2022-08-14 HISTORY — PX: INSERTION OF DIALYSIS CATHETER: SHX1324

## 2022-08-14 HISTORY — PX: AV FISTULA PLACEMENT: SHX1204

## 2022-08-14 HISTORY — DX: Other fecal abnormalities: R19.5

## 2022-08-14 LAB — BASIC METABOLIC PANEL
Anion gap: 13 (ref 5–15)
BUN: 64 mg/dL — ABNORMAL HIGH (ref 8–23)
CO2: 17 mmol/L — ABNORMAL LOW (ref 22–32)
Calcium: 7.7 mg/dL — ABNORMAL LOW (ref 8.9–10.3)
Chloride: 106 mmol/L (ref 98–111)
Creatinine, Ser: 4.56 mg/dL — ABNORMAL HIGH (ref 0.44–1.00)
GFR, Estimated: 10 mL/min — ABNORMAL LOW (ref >60)
Glucose, Bld: 104 mg/dL — ABNORMAL HIGH (ref 70–99)
Potassium: 4.5 mmol/L (ref 3.5–5.1)
Sodium: 136 mmol/L (ref 135–145)

## 2022-08-14 LAB — CBC
HCT: 23.1 % — ABNORMAL LOW (ref 36.0–46.0)
Hemoglobin: 7.4 g/dL — ABNORMAL LOW (ref 12.0–15.0)
MCH: 28.9 pg (ref 26.0–34.0)
MCHC: 32 g/dL (ref 30.0–36.0)
MCV: 90.2 fL (ref 80.0–100.0)
Platelets: 378 10*3/uL (ref 150–400)
RBC: 2.56 MIL/uL — ABNORMAL LOW (ref 3.87–5.11)
RDW: 19.9 % — ABNORMAL HIGH (ref 11.5–15.5)
WBC: 8.1 10*3/uL (ref 4.0–10.5)
nRBC: 0.6 % — ABNORMAL HIGH (ref 0.0–0.2)

## 2022-08-14 LAB — TYPE AND SCREEN
ABO/RH(D): A POS
Antibody Screen: NEGATIVE
Unit division: 0

## 2022-08-14 LAB — GLUCOSE, CAPILLARY: Glucose-Capillary: 92 mg/dL (ref 70–99)

## 2022-08-14 LAB — CULTURE, BLOOD (ROUTINE X 2)
Culture: NO GROWTH
Special Requests: ADEQUATE

## 2022-08-14 LAB — HEPATITIS B SURFACE ANTIBODY, QUANTITATIVE: Hep B S AB Quant (Post): <3.5 m[IU]/mL — ABNORMAL LOW (ref >9.9)

## 2022-08-14 LAB — BPAM RBC: Unit Type and Rh: 6200

## 2022-08-14 SURGERY — ARTERIOVENOUS (AV) FISTULA CREATION
Anesthesia: General | Site: Neck | Laterality: Right

## 2022-08-14 MED ORDER — SUGAMMADEX SODIUM 200 MG/2ML IV SOLN
INTRAVENOUS | Status: DC | PRN
  Administered 2022-08-14: 200 mg via INTRAVENOUS

## 2022-08-14 MED ORDER — MIDAZOLAM HCL 2 MG/2ML IJ SOLN
INTRAMUSCULAR | Status: AC
  Filled 2022-08-14: qty 2

## 2022-08-14 MED ORDER — ORAL CARE MOUTH RINSE: 15.0000 mL | Freq: Once | OROMUCOSAL | Status: AC

## 2022-08-14 MED ORDER — HEPARIN SODIUM (PORCINE) 1000 UNIT/ML IJ SOLN
INTRAMUSCULAR | Status: AC
  Filled 2022-08-14: qty 10

## 2022-08-14 MED ORDER — LIDOCAINE-PRILOCAINE 2.5-2.5 % EX CREA: 1.0000 | TOPICAL_CREAM | CUTANEOUS | Status: DC | PRN

## 2022-08-14 MED ORDER — LIDOCAINE HCL (PF) 1 % IJ SOLN: 5.0000 mL | INTRAMUSCULAR | Status: DC | PRN

## 2022-08-14 MED ORDER — PROPOFOL 10 MG/ML IV BOLUS
INTRAVENOUS | Status: AC
  Filled 2022-08-14: qty 20

## 2022-08-14 MED ORDER — OXYCODONE HCL 5 MG/5ML PO SOLN: 5.0000 mg | Freq: Once | ORAL | Status: DC | PRN

## 2022-08-14 MED ORDER — CHLORHEXIDINE GLUCONATE 0.12 % MT SOLN
OROMUCOSAL | Status: AC
  Administered 2022-08-14: 15 mL via OROMUCOSAL
  Filled 2022-08-14: qty 15

## 2022-08-14 MED ORDER — PROPOFOL 10 MG/ML IV BOLUS
INTRAVENOUS | Status: DC | PRN
  Administered 2022-08-14: 100 mg via INTRAVENOUS

## 2022-08-14 MED ORDER — ALBUTEROL SULFATE (2.5 MG/3ML) 0.083% IN NEBU
INHALATION_SOLUTION | RESPIRATORY_TRACT | Status: AC
  Administered 2022-08-14: 2.5 mg via RESPIRATORY_TRACT
  Filled 2022-08-14: qty 3

## 2022-08-14 MED ORDER — OXYCODONE HCL 5 MG PO TABS: 5.0000 mg | ORAL_TABLET | Freq: Once | ORAL | Status: DC | PRN

## 2022-08-14 MED ORDER — PROMETHAZINE HCL 25 MG/ML IJ SOLN: 6.2500 mg | INTRAMUSCULAR | Status: DC | PRN

## 2022-08-14 MED ORDER — CHLORHEXIDINE GLUCONATE 0.12 % MT SOLN: 15.0000 mL | Freq: Once | OROMUCOSAL | Status: AC

## 2022-08-14 MED ORDER — ANTICOAGULANT SODIUM CITRATE 4% (200MG/5ML) IV SOLN: 5.0000 mL | Status: DC | PRN

## 2022-08-14 MED ORDER — HEPARIN SODIUM (PORCINE) 1000 UNIT/ML DIALYSIS
1000.0000 [IU] | INTRAMUSCULAR | Status: DC | PRN
  Administered 2022-08-14: 3200 [IU]
  Filled 2022-08-14: qty 1

## 2022-08-14 MED ORDER — DEXAMETHASONE SODIUM PHOSPHATE 10 MG/ML IJ SOLN: INTRAMUSCULAR | Status: DC | PRN

## 2022-08-14 MED ORDER — SODIUM CHLORIDE 0.9 % IV SOLN: INTRAVENOUS | Status: DC

## 2022-08-14 MED ORDER — ONDANSETRON HCL 4 MG/2ML IJ SOLN
INTRAMUSCULAR | Status: DC | PRN
  Administered 2022-08-14: 4 mg via INTRAVENOUS

## 2022-08-14 MED ORDER — ALBUTEROL SULFATE (2.5 MG/3ML) 0.083% IN NEBU: 2.5000 mg | INHALATION_SOLUTION | Freq: Once | RESPIRATORY_TRACT | Status: AC

## 2022-08-14 MED ORDER — MIDAZOLAM HCL 2 MG/2ML IJ SOLN
INTRAMUSCULAR | Status: DC | PRN
  Administered 2022-08-14: 1 mg via INTRAVENOUS

## 2022-08-14 MED ORDER — MIDAZOLAM HCL 2 MG/2ML IJ SOLN: 0.5000 mg | Freq: Once | INTRAMUSCULAR | Status: DC | PRN

## 2022-08-14 MED ORDER — FENTANYL CITRATE (PF) 100 MCG/2ML IJ SOLN
25.0000 ug | INTRAMUSCULAR | Status: DC | PRN
  Administered 2022-08-14: 50 ug via INTRAVENOUS
  Administered 2022-08-14: 25 ug via INTRAVENOUS

## 2022-08-14 MED ORDER — ALTEPLASE 2 MG IJ SOLR: 2.0000 mg | Freq: Once | INTRAMUSCULAR | Status: DC | PRN

## 2022-08-14 MED ORDER — ROCURONIUM BROMIDE 10 MG/ML (PF) SYRINGE
PREFILLED_SYRINGE | INTRAVENOUS | Status: DC | PRN
  Administered 2022-08-14: 20 mg via INTRAVENOUS
  Administered 2022-08-14: 50 mg via INTRAVENOUS

## 2022-08-14 MED ORDER — FENTANYL CITRATE (PF) 250 MCG/5ML IJ SOLN
INTRAMUSCULAR | Status: DC | PRN
  Administered 2022-08-14 (×2): 50 ug via INTRAVENOUS
  Administered 2022-08-14: 100 ug via INTRAVENOUS

## 2022-08-14 MED ORDER — PAPAVERINE HCL 30 MG/ML IJ SOLN
INTRAMUSCULAR | Status: AC
  Filled 2022-08-14: qty 2

## 2022-08-14 MED ORDER — ONDANSETRON HCL 4 MG/2ML IJ SOLN
INTRAMUSCULAR | Status: AC
  Filled 2022-08-14: qty 2

## 2022-08-14 MED ORDER — FENTANYL CITRATE (PF) 100 MCG/2ML IJ SOLN
INTRAMUSCULAR | Status: AC
  Filled 2022-08-14: qty 2

## 2022-08-14 MED ORDER — 0.9 % SODIUM CHLORIDE (POUR BTL) OPTIME
TOPICAL | Status: DC | PRN
  Administered 2022-08-14: 1000 mL

## 2022-08-14 MED ORDER — CALCIUM ACETATE (PHOS BINDER) 667 MG PO CAPS
667.0000 mg | ORAL_CAPSULE | Freq: Three times a day (TID) | ORAL | Status: DC
  Administered 2022-08-15 – 2022-08-19 (×12): 667 mg via ORAL
  Filled 2022-08-14 (×11): qty 1

## 2022-08-14 MED ORDER — HYDROMORPHONE HCL 1 MG/ML IJ SOLN
1.0000 mg | Freq: Once | INTRAMUSCULAR | Status: AC
  Administered 2022-08-14: 1 mg via INTRAVENOUS
  Filled 2022-08-14: qty 1

## 2022-08-14 MED ORDER — HEPARIN SODIUM (PORCINE) 1000 UNIT/ML IJ SOLN
INTRAMUSCULAR | Status: DC | PRN
  Administered 2022-08-14: 3200 [IU] via INTRAVENOUS

## 2022-08-14 MED ORDER — HEPARIN 6000 UNIT IRRIGATION SOLUTION
Status: DC | PRN
  Administered 2022-08-14: 1

## 2022-08-14 MED ORDER — LIDOCAINE 2% (20 MG/ML) 5 ML SYRINGE
INTRAMUSCULAR | Status: DC | PRN
  Administered 2022-08-14: 40 mg via INTRAVENOUS

## 2022-08-14 MED ORDER — PENTAFLUOROPROP-TETRAFLUOROETH EX AERO: 1.0000 | INHALATION_SPRAY | CUTANEOUS | Status: DC | PRN

## 2022-08-14 MED ORDER — FENTANYL CITRATE (PF) 250 MCG/5ML IJ SOLN
INTRAMUSCULAR | Status: AC
  Filled 2022-08-14: qty 5

## 2022-08-14 SURGICAL SUPPLY — 68 items
ADH SKN CLS APL DERMABOND .7 (GAUZE/BANDAGES/DRESSINGS) ×2
APL PRP STRL LF DISP 70% ISPRP (MISCELLANEOUS) ×2
APL SKNCLS STERI-STRIP NONHPOA (GAUZE/BANDAGES/DRESSINGS) ×2
ARMBAND PINK RESTRICT EXTREMIT (MISCELLANEOUS) ×2 IMPLANT
BAG DECANTER FOR FLEXI CONT (MISCELLANEOUS) ×2 IMPLANT
BENZOIN TINCTURE PRP APPL 2/3 (GAUZE/BANDAGES/DRESSINGS) ×2 IMPLANT
BIOPATCH RED 1 DISK 7.0 (GAUZE/BANDAGES/DRESSINGS) ×2 IMPLANT
CANISTER SUCT 3000ML PPV (MISCELLANEOUS) ×2 IMPLANT
CANNULA VESSEL 3MM 2 BLNT TIP (CANNULA) ×2 IMPLANT
CATH PALINDROME-P 19CM W/VT (CATHETERS) IMPLANT
CATH PALINDROME-P 23CM W/VT (CATHETERS) IMPLANT
CATH PALINDROME-P 28CM W/VT (CATHETERS) IMPLANT
CHLORAPREP W/TINT 26 (MISCELLANEOUS) ×2 IMPLANT
CLIP LIGATING EXTRA MED SLVR (CLIP) ×2 IMPLANT
CLIP LIGATING EXTRA SM BLUE (MISCELLANEOUS) ×2 IMPLANT
CLSR STERI-STRIP ANTIMIC 1/2X4 (GAUZE/BANDAGES/DRESSINGS) IMPLANT
COVER PROBE W GEL 5X96 (DRAPES) ×2 IMPLANT
COVER SURGICAL LIGHT HANDLE (MISCELLANEOUS) ×2 IMPLANT
DERMABOND ADVANCED .7 DNX12 (GAUZE/BANDAGES/DRESSINGS) IMPLANT
DRAPE C-ARM 42X72 X-RAY (DRAPES) ×2 IMPLANT
DRAPE CHEST BREAST 15X10 FENES (DRAPES) ×2 IMPLANT
DRAPE ORTHO SPLIT 77X108 STRL (DRAPES) ×2
DRAPE SURG ORHT 6 SPLT 77X108 (DRAPES) IMPLANT
DRSG COVADERM 4X6 (GAUZE/BANDAGES/DRESSINGS) IMPLANT
ELECT REM PT RETURN 9FT ADLT (ELECTROSURGICAL) ×2
ELECTRODE REM PT RTRN 9FT ADLT (ELECTROSURGICAL) ×2 IMPLANT
GAUZE 4X4 16PLY ~~LOC~~+RFID DBL (SPONGE) ×2 IMPLANT
GAUZE SPONGE 4X4 12PLY STRL (GAUZE/BANDAGES/DRESSINGS) ×2 IMPLANT
GLOVE BIO SURGEON STRL SZ8 (GLOVE) ×2 IMPLANT
GOWN STRL REUS W/ TWL LRG LVL3 (GOWN DISPOSABLE) ×4 IMPLANT
GOWN STRL REUS W/ TWL XL LVL3 (GOWN DISPOSABLE) ×2 IMPLANT
GOWN STRL REUS W/TWL LRG LVL3 (GOWN DISPOSABLE) ×4
GOWN STRL REUS W/TWL XL LVL3 (GOWN DISPOSABLE) ×2
INSERT FOGARTY SM (MISCELLANEOUS) IMPLANT
KIT BASIN OR (CUSTOM PROCEDURE TRAY) ×2 IMPLANT
KIT PALINDROME-P 55CM (CATHETERS) IMPLANT
KIT TURNOVER KIT B (KITS) ×2 IMPLANT
LOOP VESSEL MAXI  1X406 RED (MISCELLANEOUS) ×2
LOOP VESSEL MAXI 1X406 RED (MISCELLANEOUS) IMPLANT
NDL 18GX1X1/2 (RX/OR ONLY) (NEEDLE) ×2 IMPLANT
NDL HYPO 25GX1X1/2 BEV (NEEDLE) ×2 IMPLANT
NEEDLE 18GX1X1/2 (RX/OR ONLY) (NEEDLE) ×2 IMPLANT
NEEDLE HYPO 25GX1X1/2 BEV (NEEDLE) ×2 IMPLANT
NS IRRIG 1000ML POUR BTL (IV SOLUTION) ×2 IMPLANT
PACK BASIC III (CUSTOM PROCEDURE TRAY) ×2
PACK CV ACCESS (CUSTOM PROCEDURE TRAY) ×2 IMPLANT
PACK SRG BSC III STRL LF ECLPS (CUSTOM PROCEDURE TRAY) ×2 IMPLANT
PAD ARMBOARD 7.5X6 YLW CONV (MISCELLANEOUS) ×4 IMPLANT
SET MICROPUNCTURE 5F STIFF (MISCELLANEOUS) ×2 IMPLANT
SLING ARM FOAM STRAP LRG (SOFTGOODS) IMPLANT
SLING ARM FOAM STRAP MED (SOFTGOODS) IMPLANT
SOAP 2 % CHG 4 OZ (WOUND CARE) ×2 IMPLANT
STRIP CLOSURE SKIN 1/2X4 (GAUZE/BANDAGES/DRESSINGS) ×2 IMPLANT
SUT ETHILON 3 0 PS 1 (SUTURE) ×2 IMPLANT
SUT MNCRL AB 4-0 PS2 18 (SUTURE) ×2 IMPLANT
SUT PROLENE 6 0 BV (SUTURE) ×2 IMPLANT
SUT VIC AB 3-0 SH 27 (SUTURE) ×2
SUT VIC AB 3-0 SH 27X BRD (SUTURE) ×2 IMPLANT
SYR 10ML LL (SYRINGE) ×2 IMPLANT
SYR 20ML LL LF (SYRINGE) ×4 IMPLANT
SYR 3ML LL SCALE MARK (SYRINGE) IMPLANT
SYR 5ML LL (SYRINGE) ×2 IMPLANT
SYR CONTROL 10ML LL (SYRINGE) ×2 IMPLANT
TOWEL GREEN STERILE (TOWEL DISPOSABLE) ×2 IMPLANT
TOWEL GREEN STERILE FF (TOWEL DISPOSABLE) ×4 IMPLANT
UNDERPAD 30X36 HEAVY ABSORB (UNDERPADS AND DIAPERS) ×2 IMPLANT
WATER STERILE IRR 1000ML POUR (IV SOLUTION) ×2 IMPLANT
WIRE BENTSON .035X145CM (WIRE) IMPLANT

## 2022-08-14 NOTE — Progress Notes (Signed)
Patient received Albuterol nebulizer per Dr. Jean Rosenthal verbal order, for wheezing. Patient tolerated well; will continue to monitor.

## 2022-08-14 NOTE — Progress Notes (Signed)
Heart Failure Navigator Progress Note  Assessed for Heart & Vascular TOC clinic readiness.  Patient does not meet criteria due to CKD IV, starting outpatient hemodialysis.   Navigator will sign off at this time.   Rhae Hammock, BSN, Scientist, clinical (histocompatibility and immunogenetics) Only

## 2022-08-14 NOTE — Progress Notes (Signed)
Pt verbally aggressive and cursing and yelling at staff. pt informed that language like that will not be tolerated and that she can't be disturbing and scarring the other pts. Pt given percocet for pain, she cursing and yelling saying that wont work. Pt educated again on surgery that she had and the pain medication.

## 2022-08-14 NOTE — Progress Notes (Signed)
  Progress Note    08/14/2022 6:53 AM Hospital Day 1  Subjective:  says she really doesn't know what is going on and does not know what all this entails.   afebrile  Vitals:   08/13/22 2324 08/14/22 0425  BP: (!) 154/79 (!) 156/77  Pulse: 99 99  Resp: 18 16  Temp: 98.4 F (36.9 C) 97.9 F (36.6 C)  SpO2: 90% 91%    Physical Exam: General:  no distress Lungs:  non labored Extremities:  easily palpable right radial pulse  CBC    Component Value Date/Time   WBC 8.1 08/14/2022 0139   RBC 2.56 (L) 08/14/2022 0139   HGB 7.4 (L) 08/14/2022 0139   HGB 6.0 (LL) 06/06/2022 1410   HCT 23.1 (L) 08/14/2022 0139   HCT 18.1 (L) 06/06/2022 1410   PLT 378 08/14/2022 0139   PLT 353 06/06/2022 1410   MCV 90.2 08/14/2022 0139   MCV 96 06/06/2022 1410   MCH 28.9 08/14/2022 0139   MCHC 32.0 08/14/2022 0139   RDW 19.9 (H) 08/14/2022 0139   RDW 13.6 06/06/2022 1410   LYMPHSABS 1.1 08/12/2022 1708   LYMPHSABS 1.3 06/06/2022 1410   MONOABS 0.6 08/12/2022 1708   EOSABS 0.4 08/12/2022 1708   EOSABS 0.4 06/06/2022 1410   BASOSABS 0.1 08/12/2022 1708   BASOSABS 0.0 06/06/2022 1410    BMET    Component Value Date/Time   NA 136 08/14/2022 0139   NA 137 06/06/2022 1410   K 4.5 08/14/2022 0139   CL 106 08/14/2022 0139   CO2 17 (L) 08/14/2022 0139   GLUCOSE 104 (H) 08/14/2022 0139   BUN 64 (H) 08/14/2022 0139   BUN 37 (H) 06/06/2022 1410   CREATININE 4.56 (H) 08/14/2022 0139   CREATININE 1.64 (H) 04/29/2016 1047   CALCIUM 7.7 (L) 08/14/2022 0139   GFRNONAA 10 (L) 08/14/2022 0139   GFRNONAA 35 (L) 04/29/2016 1047   GFRAA 36 (L) 08/31/2019 1230   GFRAA 40 (L) 04/29/2016 1047    INR    Component Value Date/Time   INR 1.1 08/12/2022 2140     Intake/Output Summary (Last 24 hours) at 08/14/2022 0653 Last data filed at 08/14/2022 0054 Gross per 24 hour  Intake 1314.08 ml  Output 300 ml  Net 1014.08 ml     Assessment/Plan:  62 y.o. female with AKI on CKD now with ESRD   Hospital Day 1  -plan for right arm permanent access and TDC placement today.  -discussed with pt TDC, fistula vs graft.  Discussed that they sometimes do not mature as we would like and may need intervention or even new access.   -discussed npo as this would get her OR case cancelled   Doreatha Massed, PA-C [facility] [phone] 08/14/2022 6:53 AM

## 2022-08-14 NOTE — Anesthesia Preprocedure Evaluation (Addendum)
Anesthesia Evaluation  Patient identified by MRN, date of birth, ID band Patient awake    Reviewed: Allergy & Precautions, NPO status , Patient's Chart, lab work & pertinent test results  History of Anesthesia Complications Negative for: history of anesthetic complications  Airway Mallampati: I  TM Distance: >3 FB Neck ROM: Full    Dental  (+) Edentulous Upper, Edentulous Lower   Pulmonary COPD,  COPD inhaler, Current Smoker and Patient abstained from smoking.  Treated with Albuterol neb   + wheezing      Cardiovascular hypertension, Pt. on medications  Rhythm:Regular Rate:Normal  08/13/2022 ECHO: EF 55 to 60%. The LV has normal function, no regional wall motion abnormalities. Grade I diastolic dysfunction (impaired relaxation).   2. RVF is normal. The right ventricular size is normal. There is mildly elevated pulmonary artery systolic pressure.   3. The mitral valve is degenerative. Mild MR.   4. The aortic valve is tricuspid. Aortic valve regurgitation is not visualized. Mild AS. Aortic valve mean gradient 12.5 mmHg. Aortic valve Vmax measures 2.37 m/s.     Neuro/Psych   Anxiety Depression    negative neurological ROS     GI/Hepatic ,GERD  Medicated and Controlled,,(+)     substance abuse (last use 4-5d ago)  cocaine use and marijuana use  Endo/Other  diabetes (glu 104)    Renal/GU Renal InsufficiencyRenal disease     Musculoskeletal   Abdominal   Peds  Hematology  (+) Blood dyscrasia (Hb 7.4), anemia   Anesthesia Other Findings   Reproductive/Obstetrics                             Anesthesia Physical Anesthesia Plan  ASA: 3  Anesthesia Plan: General   Post-op Pain Management: Tylenol PO (pre-op)*   Induction: Intravenous  PONV Risk Score and Plan: 1 and Ondansetron, Treatment may vary due to age or medical condition and Dexamethasone  Airway Management Planned: Oral  ETT  Additional Equipment: None  Intra-op Plan:   Post-operative Plan: Extubation in OR  Informed Consent: I have reviewed the patients History and Physical, chart, labs and discussed the procedure including the risks, benefits and alternatives for the proposed anesthesia with the patient or authorized representative who has indicated his/her understanding and acceptance.       Plan Discussed with: CRNA and Surgeon  Anesthesia Plan Comments: (Plan routine monitor, Supraclavicular block )       Anesthesia Quick Evaluation

## 2022-08-14 NOTE — Op Note (Signed)
DATE OF SERVICE: 08/14/2022  PATIENT:  [name]  62 y.o. female  PRE-OPERATIVE DIAGNOSIS:  ESRD  POST-OPERATIVE DIAGNOSIS:  Same  PROCEDURE:   1) right internal jugular tunneled dialysis catheter placement (19cm) 2) right brachiobasilic arteriovenous fistula creation  SURGEON:  Surgeon(s) and Role:    * Leonie Douglas, MD - Primary  ASSISTANT: Loel Dubonnet, PA-C  An experienced assistant was required given the complexity of this procedure and the standard of surgical care. My assistant helped with exposure through counter tension, suctioning, ligation and retraction to better visualize the surgical field.  My assistant expedited sewing during the case by following my sutures. Wherever I use the term "we" in the report, my assistant actively helped me with that portion of the procedure.  ANESTHESIA:   general  EBL: 50mL  BLOOD ADMINISTERED:none  DRAINS: none   LOCAL MEDICATIONS USED:  NONE  SPECIMEN:  none  COUNTS: confirmed correct.  TOURNIQUET:  none  PATIENT DISPOSITION:  PACU - hemodynamically stable.   Delay start of Pharmacological VTE agent (>24hrs) due to surgical blood loss or risk of bleeding: no  INDICATION FOR PROCEDURE: [name] is a 62 y.o. female with ESRD in need of permanent dialysis access. After careful discussion of risks, benefits, and alternatives the patient was offered [facility] placement and right arm AVG vs. AVF. The patient understood and wished to proceed.  OPERATIVE FINDINGS: healthier than expected basilic vein. Successful first stage brachiobasilic arteriovenous fistula. Successful TDC placement.   DESCRIPTION OF PROCEDURE: After identification of the patient in the pre-operative holding area, the patient was transferred to the operating room. The patient was positioned supine on the operating room table. Anesthesia was induced. The right arm, chest, and neck were prepped and draped in standard fashion. A surgical pause was performed  confirming correct patient, procedure, and operative location.  Using ultrasound guidance the right internal jugular vein was accessed with micropuncture technique.  Through the micropuncture sheath a floppy J-wire was advanced into the superior vena cava.  A small incision was made around the skin access point.  The access point was serially dilated under direct fluoroscopic guidance.  A peel-away sheath was introduced into the superior vena cava under fluoroscopic guidance.  A counterincision was made in the chest under the clavicle.  A 19 cm tunnel dialysis catheter was then tunneled under the skin, over the clavicle into the incision in the neck.  The tunneling device was removed and the catheter fed through the peel-away sheath into the superior vena cava.  The peel-away sheath was removed and the catheter gently pulled back.  Adequate position was confirmed with x-ray.  The catheter was tested and found to flush and draw back well.  Catheter was heparin locked.  Caps were applied.  Catheter was sutured to the skin.  The neck incision was closed with 4-0 Monocryl.  Using intraoperative ultrasound the right brachial artery and basilic vein were mapped.  A curvilinear incision was planned over the course of the two vessels to allow fistula creation.  Incision was created.  Incision was carried down through subcutaneous tissue.  The aponeurosis of the biceps tendon was divided.  The brachial sheath was identified.  The brachial artery was skeletonized.  The artery was encircled with 2 Silastic Vesseloops.  Next attention was turned to the basilic vein.  This was identified in the medial arm in its typical position.  The vein was mobilized throughout the length of the incision to allow tension-free arteriovenous  fistula creation.  The distal end of the vein was clamped with a right angle.  The proximal end of the vein was clamped with a bulldog.  The vein was transected distally.  The stump was oversewn with a  2-0 silk.  The cut end of the vein was spatulated and distended with a mosquito clamp.  Patient was systemically heparinized with 3000 units of IV heparin.  After a three minute pause, the brachial artery was clamped proximally distally.  The basilic vein was anastomosed to the brachial artery into side using continuous running suture of 6-0 Prolene.  Immediately prior to completion the anastomosis was flushed and de-aired.  The anastomosis was completed.  Clamps were released.  Hemostasis was achieved.  An audible bruit was heard in the fistula.  Doppler signal was heard over the radial artery. Stasis was achieved in the surgical bed.  The wound was closed with 3-0 Vicryl and 4-0 Monocryl.  Upon completion of the case instrument and sharps counts were confirmed correct. The patient was transferred to the PACU in good condition. I was present for all portions of the procedure.  FOLLOW UP PLAN: Assuming a normal postoperative course, VVS PA will see the patient in 4-6 weeks with AVF duplex. She may use the catheter at any point for dialysis.   Rande Brunt. Lenell Antu, MD [facility] Phone Number: [phone] 08/14/2022 12:22 PM

## 2022-08-14 NOTE — Progress Notes (Addendum)
Requested to see pt for out-pt HD needs at d/c. Pt currently in PACU after procedure. Will attempt to see pt later today or tomorrow morning if possible.   Olivia Canter Renal Navigator [phone]  Addendum at 3:10 pm: Attempted to meet with pt at bedside but pt being taken to HD. Will attempt to see pt in the am.

## 2022-08-14 NOTE — Progress Notes (Signed)
Pt came in to HD unit eating pineapples and talking.when she finished the pineapples she began crying and yelling her arm hurt where she got the access placed. Pt educated on HD process, surgery on arm and catheter, pt states"whatever I don't care" I said well you asked a question and we are trying to educate you and you are now telling me you don't care to listen, we are trying to answer your questions and help you understand.  Offered her tylenol she refused. Next pain medication isn't due til 1605 .

## 2022-08-14 NOTE — Progress Notes (Signed)
Received patient in bed to unit.  Alert and oriented.  Informed consent signed and in chart.   TX duration:2.5  Patient tolerated well.  Transported back to the room  Alert, without acute distress.  Hand-off given to patient's nurse.   Access used: right [facility] Access issues: none  Total UF removed: 1L Medication(s) given: PERCOCET, FERRIC GLUCONATE   08/14/22 1809  Vitals  BP (!) 171/78  MAP (mmHg) 104  BP Location Left Arm  BP Method Automatic  Patient Position (if appropriate) Lying  Pulse Rate 86  Pulse Rate Source Monitor  ECG Heart Rate 85  Resp 14  Oxygen Therapy  SpO2 100 %  During Treatment Monitoring  HD Safety Checks Performed Yes  Intra-Hemodialysis Comments Tx completed  Dialysis Fluid Bolus Normal Saline  Bolus Amount (mL) 300 mL      Kamelia Lampkins S [facility]

## 2022-08-14 NOTE — Consult Note (Signed)
Consultation  Referring Provider:  [facility]  Primary Care Physician:  [name], MD Primary Gastroenterologist:  Dr. Myrtie Neither       Reason for Consultation:     acute on chronic anemia  LOS: 1 day          HPI:   [name] is a 62 y.o. female with past medical history significant for  anemia (baseline 7-8), PAD, GERD, gastritis, hypertension, hyperlipidemia, CKD, COPD, hepatitis C, substance use, depression, anxiety, diastolic CHF presents for evaluation of anemia.  Patient originally presented to emergency department with shortness of breath.  History of anemia with baseline hemoglobin 7-9.  Upon review of labs it appears this has been ongoing since 2017  Recent admission 5/16-5/20 1 for symptomatic anemia due to chronic GI bleed.  Was given 2 units PRBCs and hemoglobin improved to 7 (from 5.6) and remained stable. FOBT negative at that time. Patient received dose of EPO and was discharged.  Since discharge patient has had continued shortness of breath and increasing edema with a productive cough.  Stopped using IV drugs and cocaine.  Continues to use marijuana.  Patient was initially hypoxic at 80% and had anasarca in the legs, arms, and abdomen.  Chest x-ray showed cardiomegaly and pulmonary edema.  Patient was admitted for acute hypoxic respiratory failure in the setting of CHF exacerbation and possible COPD exacerbation.  Seen by nephrology.  Due to progressive and ESRD they recommended starting dialysis.  Underwent TDC placement today.  Talked with patient after recently returning from surgery. She states it is 2026. Likely still recovering from anesthesia as RN states her baseline is A&O x4. RN denies stools today, though notes dark stools yesterday. Heme positive.   PREVIOUS GI WORKUP  -EGD 11/17/2021 with Dr. Chales Abrahams inpatient for anemia: Mild gastritis, otherwise normal EGD.  No use of GI bleeding.  Plan for outpatient VCE in 4 to 6 weeks, patient lost to  follow-up  -Colonoscopy 10/2020 for surveillance 12 mm polyp in proximal ascending colon (sessile serrated polyp).  10 mm hyperplastic polyp colon.  Multiple diverticula in the left colon  -EGD June 2021 for suspected GI bleed: Gastritis, nonbleeding gastric ulcers with no stigmata of bleeding, nonbleeding duodenal ulcers with no stigmata of bleeding, erythematous duodenopathy, no specimens collected  -EGD October 2020 for epigastric pain and melena: LA grade D acute esophagitis (benign squamous mucosa with ulceration), acute gastritis (chronic inactive gastritis, negative H. pylori), nonbleeding duodenal ulcers with no stigmata of bleeding with severe bulbar duodenitis  -Colonoscopy 03/2016 with Dr. Myrtie Neither for screening: 4 mm tubular adenoma in the sigmoid, otherwise  Past Medical History:  Diagnosis Date   Alcohol abuse    Allergy    Anxiety    Arthritis    Asthma    Cannabis abuse    Cocaine abuse (HCC)    COPD (chronic obstructive pulmonary disease) (HCC)    Depression    Drug addiction (HCC)    GERD (gastroesophageal reflux disease)    Heart murmur    Homelessness    Hyperlipidemia    Hypertension    pt stated "every once in a while BP will be high but has not been prescribed medication for HTN.    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 Family History:  Her family history includes Diabetes in her father. Social History:   reports that she has been smoking cigarettes. She has been smoking an average of 1.00 packs per day. She has been exposed to tobacco smoke. She has never used smokeless tobacco. She reports that she does not currently use alcohol. She reports current drug use. Drug: Marijuana.  Prior to Admission medications   Medication Sig Start Date End Date Taking? Authorizing Provider  acetaminophen (TYLENOL) 500 MG tablet Take 1,000 mg by mouth 2 (two) times daily as needed for moderate pain, fever or headache.   Yes [provider]  albuterol (VENTOLIN HFA) 108 (90 Base) MCG/ACT inhaler INHALE 2 PUFFS INTO THE LUNGS EVERY 6 HOURS AS NEEDED FOR WHEEZING OR SHORTNESS OF BREATH. 01/21/22  Yes Claiborne Rigg, NP  amLODipine (NORVASC) 10 MG tablet Take 1 tablet (10 mg total) by mouth daily. 08/06/22  Yes Dahal, Melina Schools, MD  busPIRone (BUSPAR) 10 MG tablet Take 1 tablet (10 mg total) by mouth 2 (two) times daily. 01/21/22  Yes Claiborne Rigg, NP  ferrous sulfate 325 (65 FE) MG tablet Take 1 tablet (325 mg total) by mouth daily with breakfast. 08/05/22  Yes Dahal, Melina Schools, MD  Fluticasone-Umeclidin-Vilant (TRELEGY ELLIPTA) 100-62.5-25 MCG/ACT AEPB Inhale 1 puff into the lungs daily. 01/21/22  Yes Claiborne Rigg, NP  gabapentin (NEURONTIN) 100 MG capsule Take 1 capsule (100 mg total) by mouth 3 (three) times daily. 01/21/22  Yes Claiborne Rigg, NP  ipratropium-albuterol (DUONEB) 0.5-2.5 (3) MG/3ML SOLN Take 3 mLs by nebulization every 6 (six) hours as needed (shortness of breath and wheezing). Patient taking differently: Take 3 mLs by nebulization every 6 (six) hours as needed (shortness of breath, wheezing). 01/21/22  Yes Claiborne Rigg, NP  Multiple Vitamin (MULTIVITAMIN WITH MINERALS) TABS tablet Take 1 tablet by mouth daily. 06/29/20  Yes [name],  MD  oxyCODONE-acetaminophen (PERCOCET/ROXICET) 5-325 MG tablet Take 1 tablet by mouth every 8 (eight) hours as needed for severe pain or moderate pain. 08/05/22  Yes Dahal, Melina Schools, MD  pantoprazole (PROTONIX) 40 MG tablet Take 1 tablet (40 mg total) by mouth daily. 08/05/22  Yes Dahal, Melina Schools, MD  sodium bicarbonate 650 MG tablet Take 2 tablets (1,300 mg total) by mouth 3 (three) times daily. 01/25/22  Yes   traZODone (DESYREL) 50 MG tablet Take 1 tablet (50 mg total) by mouth at bedtime as needed for sleep. 01/21/22  Yes Claiborne Rigg, NP    Current Facility-Administered Medications  Medication Dose Route Frequency Provider Last Rate Last Admin   0.9 %  sodium chloride infusion   Intravenous Continuous Jairo Ben, MD   Stopped at 08/14/22 1237   [MAR Hold] acetaminophen (TYLENOL) tablet 650 mg  650 mg Oral Q6H PRN Synetta Fail, MD   650 mg at 08/12/22 2050   Or   [MAR Hold] acetaminophen (TYLENOL) suppository 650 mg  650 mg Rectal Q6H PRN Synetta Fail, MD       alteplase (CATHFLO ACTIVASE) injection 2 mg  2 mg Intracatheter Once PRN Annie Sable, MD       anticoagulant sodium  citrate solution 5 mL  5 mL Intracatheter PRN Annie Sable, MD       [MAR Hold] busPIRone (BUSPAR) tablet 10 mg  10 mg Oral BID Synetta Fail, MD   10 mg at 08/14/22 0805   [MAR Hold] calcium acetate (PHOSLO) capsule 667 mg  667 mg Oral TID WC Annie Sable, MD       [MAR Hold] Chlorhexidine Gluconate Cloth 2 % PADS 6 each  6 each Topical Q0600 Annie Sable, MD   6 each at 08/14/22 0554   [MAR Hold] Darbepoetin Alfa (ARANESP) injection 200 mcg  200 mcg Subcutaneous Q Tue-1800 Annie Sable, MD   200 mcg at 08/13/22 1810   fentaNYL (SUBLIMAZE) 100 MCG/2ML injection            fentaNYL (SUBLIMAZE) injection 25-50 mcg  25-50 mcg Intravenous Q5 min PRN Jairo Ben, MD   25 mcg at 08/14/22 1317   [MAR Hold] ferric gluconate (FERRLECIT) 250 mg in sodium  chloride 0.9 % 250 mL IVPB  250 mg Intravenous Daily Annie Sable, MD   Stopped at 08/13/22 1727   [MAR Hold] ferrous sulfate tablet 325 mg  325 mg Oral Q breakfast Synetta Fail, MD   325 mg at 08/14/22 0805   [MAR Hold] fluticasone furoate-vilanterol (BREO ELLIPTA) 100-25 MCG/ACT 1 puff  1 puff Inhalation Daily Synetta Fail, MD   1 puff at 08/14/22 0810   And   [MAR Hold] umeclidinium bromide (INCRUSE ELLIPTA) 62.5 MCG/ACT 1 puff  1 puff Inhalation Daily Synetta Fail, MD   1 puff at 08/14/22 0810   [MAR Hold] furosemide (LASIX) injection 20 mg  20 mg Intravenous PRN Lanae Boast, MD       [MAR Hold] furosemide (LASIX) injection 80 mg  80 mg Intravenous BID Synetta Fail, MD   80 mg at 08/14/22 0805   [MAR Hold] gabapentin (NEURONTIN) capsule 100 mg  100 mg Oral TID Synetta Fail, MD   100 mg at 08/14/22 0805   heparin injection 1,000 Units  1,000 Units Intracatheter PRN Annie Sable, MD       [facility] Hold] ipratropium (ATROVENT) nebulizer solution 0.5 mg  0.5 mg Nebulization Q4H PRN Synetta Fail, MD   0.5 mg at 08/13/22 2034   lidocaine (PF) (XYLOCAINE) 1 % injection 5 mL  5 mL Intradermal PRN Annie Sable, MD       lidocaine-prilocaine (EMLA) cream 1 Application  1 Application Topical PRN Annie Sable, MD       [facility] Hold] liver oil-zinc oxide (DESITIN) 40 % ointment   Topical BID Kc, Ramesh, MD   Given at 08/14/22 0806   midazolam (VERSED) injection 0.5-2 mg  0.5-2 mg Intravenous Once PRN Jairo Ben, MD       oxyCODONE (Oxy IR/ROXICODONE) immediate release tablet 5 mg  5 mg Oral Once PRN Jairo Ben, MD       Or   oxyCODONE (ROXICODONE) 5 MG/5ML solution 5 mg  5 mg Oral Once PRN Jairo Ben, MD       [facility] Hold] oxyCODONE-acetaminophen (PERCOCET/ROXICET) 5-325 MG per tablet 1 tablet  1 tablet Oral Q8H PRN Lanae Boast, MD   1 tablet at 08/14/22 0805   [MAR Hold] pantoprazole (PROTONIX) EC tablet 40 mg  40 mg Oral  Daily Synetta Fail, MD   40 mg at 08/14/22 0805   pentafluoroprop-tetrafluoroeth (GEBAUERS) aerosol 1 Application  1 Application Topical PRN Annie Sable, MD       [facility]  Hold] polyethylene glycol (MIRALAX / GLYCOLAX) packet 17 g  17 g Oral Daily PRN Synetta Fail, MD       promethazine (PHENERGAN) injection 6.25-12.5 mg  6.25-12.5 mg Intravenous Q15 min PRN Jairo Ben, MD       [facility] Hold] sodium bicarbonate tablet 1,300 mg  1,300 mg Oral TID Synetta Fail, MD   1,300 mg at 08/14/22 0805   [MAR Hold] sodium chloride flush (NS) 0.9 % injection 3 mL  3 mL Intravenous Q12H Synetta Fail, MD   3 mL at 08/14/22 0809   [MAR Hold] traZODone (DESYREL) tablet 50 mg  50 mg Oral QHS PRN Synetta Fail, MD   50 mg at 08/13/22 2109    Allergies as of 08/12/2022 - Review Complete 08/12/2022  Allergen Reaction Noted   Zestril [lisinopril] Other (See Comments) 07/11/2016    Review of Systems  Unable to perform ROS: Other       Physical Exam:  Vital signs in last 24 hours: Temp:  [97.9 F (36.6 C)-98.4 F (36.9 C)] 98.3 F (36.8 C) (05/29 1330) Pulse Rate:  [73-107] 78 (05/29 1330) Resp:  [10-33] 12 (05/29 1330) BP: (140-172)/(67-84) 150/74 (05/29 1330) SpO2:  [83 %-98 %] 94 % (05/29 1330) Weight:  [64.5 kg] 64.5 kg (05/29 0425) Last BM Date : 08/13/22 Last BM recorded by nurses in past 5 days Stool Type: Type 3 (Sausage shape with surface cracks) (08/14/2022  6:00 AM)  Physical Exam Constitutional:      Appearance: She is well-developed.  HENT:     Head: Normocephalic and atraumatic.     Nose: Nose normal. No congestion.     Mouth/Throat:     Mouth: Mucous membranes are moist.     Pharynx: Oropharynx is clear.  Eyes:     General: No scleral icterus.    Extraocular Movements: Extraocular movements intact.     Comments: Pale conjunctiva  Cardiovascular:     Rate and Rhythm: Regular rhythm. Tachycardia present.  Pulmonary:     Effort: Pulmonary  effort is normal. No respiratory distress.  Abdominal:     General: Abdomen is flat. Bowel sounds are normal. There is distension.     Palpations: Abdomen is soft. There is no mass.     Tenderness: There is no abdominal tenderness. There is no guarding or rebound.     Hernia: No hernia is present.  Musculoskeletal:        General: No swelling. Normal range of motion.     Cervical back: Normal range of motion and neck supple.  Skin:    General: Skin is warm and dry.  Neurological:     Mental Status: She is alert.     Comments: Patient thinks it is 2026, January. Just returned to room from surgery 10 mins prior to interview , likely recovering       LAB RESULTS: Recent Labs    08/12/22 1708 08/13/22 0039 08/13/22 1546 08/14/22 0139  WBC 7.3 6.5  --  8.1  HGB 7.0* 6.6* 7.4* 7.4*  HCT 22.0* 20.4* 23.0* 23.1*  PLT 371 363  --  378   BMET Recent Labs    08/12/22 1708 08/13/22 0039 08/14/22 0139  NA 132* 132* 136  K 3.4* 4.0 4.5  CL 103 102 106  CO2 14* 15* 17*  GLUCOSE 136* 149* 104*  BUN 58* 60* 64*  CREATININE 4.68* 4.78* 4.56*  CALCIUM 7.7* 7.6* 7.7*   LFT Recent Labs    08/13/22  0039  PROT 6.7  ALBUMIN 2.1*  AST 16  ALT 10  ALKPHOS 73  BILITOT 0.4   PT/INR Recent Labs    08/12/22 2140  LABPROT 14.1  INR 1.1    STUDIES: DG CHEST PORT 1 VIEW  Result Date: 08/14/2022 CLINICAL DATA:  Status post dialysis catheter insertion. EXAM: PORTABLE CHEST 1 VIEW COMPARISON:  08/12/2022 FINDINGS: Right jugular dialysis catheter has been placed. The catheter tip is in the upper SVC region. Negative for pneumothorax. Patchy airspace densities in both lungs are concerning for pulmonary edema. Increased densities in the retrocardiac space could represent pleural fluid or atelectasis. Heart size is mildly enlarged. IMPRESSION: 1. Placement of right jugular dialysis catheter with the catheter tip in the SVC region. Negative for pneumothorax. 2. Patchy airspace densities in  both lungs are concerning for pulmonary edema. 3. Increased densities in the retrocardiac space could represent pleural fluid or atelectasis. Electronically Signed   By: Richarda Overlie M.D.   On: 08/14/2022 13:39   DG C-Arm 1-60 Min-No Report  Result Date: 08/14/2022 Fluoroscopy was utilized by the requesting physician.  No radiographic interpretation.   ECHOCARDIOGRAM COMPLETE  Result Date: 08/13/2022    ECHOCARDIOGRAM REPORT   Patient Name:   ANJELINA CUPPY Brennen Date of Exam: 08/13/2022 Medical Rec #:  [id number]      Height:       67.0 in Accession #:    [id number]     Weight:       139.5 lb Date of Birth:  [birth year]      BSA:          1.735 m Patient Age:    61 years       BP:           159/93 mmHg Patient Gender: F              HR:           87 bpm. 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 CHF, COPD, Signs/Symptoms:Shortness of Breath                 and Dyspnea; Risk Factors:Hypertension and Current Smoker. IVDU.                 ETOH.  Sonographer:    Sheralyn Boatman RDCS Referring Phys: [id number] [name] MELVIN IMPRESSIONS  1. Left ventricular ejection fraction, by estimation, is 55 to 60%. The left ventricle has normal function. The left ventricle has no regional wall motion abnormalities. Left ventricular diastolic parameters are consistent with Grade I diastolic dysfunction (impaired relaxation).  2. Right ventricular systolic function is normal. The right ventricular size is normal. There is mildly elevated pulmonary artery systolic pressure.  3. The mitral valve is degenerative. Mild mitral valve regurgitation.  4. The aortic valve is tricuspid. Aortic valve regurgitation is not visualized. Mild aortic valve stenosis. Aortic valve mean  gradient measures 12.5 mmHg. Aortic valve Vmax measures 2.37 m/s.  5. The inferior vena cava is dilated in size with <50% respiratory variability, suggesting right atrial pressure of 15 mmHg. Comparison(s): No significant  change from prior study. FINDINGS  Left Ventricle: Left ventricular ejection fraction, by estimation, is 55 to 60%. The left ventricle has normal function. The left ventricle has no regional wall motion abnormalities. The left ventricular internal cavity size was normal in size. There is  no left ventricular hypertrophy. Left ventricular diastolic parameters are consistent with Grade I diastolic dysfunction (impaired relaxation). Right Ventricle: The right ventricular size is normal. No increase in right ventricular wall thickness. Right ventricular systolic function is normal. There is mildly elevated pulmonary artery systolic pressure. The tricuspid regurgitant velocity is 2.65  m/s, and with an assumed right atrial pressure of 15 mmHg, the estimated right ventricular systolic pressure is 43.1 mmHg. Left Atrium: Left atrial size was normal in size. Right Atrium: Right atrial size was normal in size. Pericardium: There is no evidence of pericardial effusion. Mitral Valve: The mitral valve is degenerative in appearance. Mild mitral valve regurgitation. MV peak gradient, 10.0 mmHg. The mean mitral valve gradient is 5.0 mmHg. Tricuspid Valve: The tricuspid valve is grossly normal. Tricuspid valve regurgitation is trivial. No evidence of tricuspid stenosis. Aortic Valve: The aortic valve is tricuspid. Aortic valve regurgitation is not visualized. Mild aortic stenosis is present. Aortic valve mean gradient measures 12.5 mmHg. Aortic valve peak gradient measures 22.5 mmHg. Aortic valve area, by VTI measures 1.40 cm. Pulmonic Valve: The pulmonic valve was grossly normal. Pulmonic valve regurgitation is not visualized. No evidence of pulmonic stenosis. Aorta: The aortic root and ascending aorta are  structurally normal, with no evidence of dilitation. Venous: The inferior vena cava is dilated in size with less than 50% respiratory variability, suggesting right atrial pressure of 15 mmHg. IAS/Shunts: The atrial septum is grossly normal.  LEFT VENTRICLE PLAX 2D LVIDd:         5.00 cm      Diastology LVIDs:         3.70 cm      LV e' medial:    6.96 cm/s LV PW:         1.20 cm      LV E/e' medial:  20.8 LV IVS:        1.00 cm      LV e' lateral:   6.90 cm/s LVOT diam:     1.80 cm      LV E/e' lateral: 21.0 LV SV:         58 LV SV Index:   34 LVOT Area:     2.54 cm  LV Volumes (MOD) LV vol d, MOD A2C: 127.5 ml LV vol d, MOD A4C: 104.0 ml LV vol s, MOD A2C: 59.0 ml LV vol s, MOD A4C: 45.1 ml LV SV MOD A2C:     68.5 ml LV SV MOD A4C:     104.0 ml LV SV MOD BP:      64.2 ml RIGHT VENTRICLE             IVC RV S prime:     12.50 cm/s  IVC diam: 2.30 cm TAPSE (M-mode): 2.5 cm LEFT ATRIUM             Index        RIGHT ATRIUM           Index LA diam:        3.90 cm 2.25 cm/m   RA Area:     13.20 cm LA Vol (A2C):   35.3 ml 20.34 ml/m  RA Volume:   33.60 ml  19.36 ml/m LA  Vol (A4C):   25.0 ml 14.41 ml/m LA Biplane Vol: 32.2 ml 18.56 ml/m  AORTIC VALVE AV Area (Vmax):    1.42 cm AV Area (Vmean):   1.36 cm AV Area (VTI):     1.40 cm AV Vmax:           237.00 cm/s AV Vmean:          163.500 cm/s AV VTI:            0.416 m AV Peak Grad:      22.5 mmHg AV Mean Grad:      12.5 mmHg LVOT Vmax:         132.00 cm/s LVOT Vmean:        87.300 cm/s LVOT VTI:          0.229 m LVOT/AV VTI ratio: 0.55  AORTA Ao Root diam: 3.20 cm Ao Asc diam:  3.10 cm MITRAL VALVE                  TRICUSPID VALVE MV Area (PHT): 3.85 cm       TR Peak grad:   28.1 mmHg MV Area VTI:   1.39 cm       TR Vmax:        265.00 cm/s MV Peak grad:  10.0 mmHg MV Mean grad:  5.0 mmHg       SHUNTS MV Vmax:       1.58 m/s       Systemic VTI:  0.23 m MV Vmean:      107.0 cm/s     Systemic Diam: 1.80 cm MV Decel Time: 197 msec MR Peak grad:    91.4 mmHg MR  Mean grad:    73.0 mmHg MR Vmax:         478.00 cm/s MR Vmean:        417.0 cm/s MR PISA:         2.26 cm MR PISA Eff ROA: 18 mm MR PISA Radius:  0.60 cm MV E velocity: 145.00 cm/s MV A velocity: 136.00 cm/s MV E/A ratio:  1.07 Lennie Odor MD Electronically signed by Lennie Odor MD Signature Date/Time: 08/13/2022/12:42:37 PM    Final    DG Chest 2 View  Result Date: 08/12/2022 CLINICAL DATA:  Suspected sepsis, fluid retention, shortness of breath, anasarca, COPD, emphysema EXAM: CHEST - 2 VIEW COMPARISON:  08/01/2022 FINDINGS: Cardiomegaly. Diffuse bilateral interstitial pulmonary opacity and right-greater-than-left small pleural effusions. Disc degenerative disease of the thoracic spine. IMPRESSION: Cardiomegaly with diffuse bilateral interstitial pulmonary opacity and right-greater-than-left small pleural effusions, consistent with edema. No focal airspace opacity. Electronically Signed   By: Jearld Lesch M.D.   On: 08/12/2022 18:12      Impression    Acute on chronic anemia - hgb 7.4, s/p 1 unit PRBCs(baseline 7-8 since 2017 with multiple EGDs) -Iron 21, TIBC 280, saturation 8% -Ferritin 160 -Folate 8.5 -Vitamin B12 507 Acute on chronic anemia with presence of heme positive stool. Hgb is baseline for patient. Suspect anemia is multi-factorial in the setting of progressive CKD leading to ESRD and HD. However, cannot r/o UGI. Anemia likely contributing to dyspnea, though suspect main etiology is CHF exacerbation with fluid overload in addition to acute on chronic anemia. Would ideally prefer improvement in pulmonary status prior to consideration of EGD  ESRD starting HD - s/p [facility] 5/29 -BUN 64, creatinine 4.56, GFR 10  Acute hypoxic respiratory failure -multifactorial in setting of acute on chronic diastolic CHF  History  of Hep C - was referred to outpatient ID for management, lost to follow up. - LFTs normal   Plan   - Consider EGD, will revisit and discuss further with patient  when she is back to baseline mentation tomorrow AM or later today - protonix 40mg  IV BID - Continue daily CBC and transfuse as needed to maintain HGB > 7   Thank you for your kind consultation, we will continue to follow.   Kelby Lotspeich Leanna Sato  08/14/2022, 1:44 PM

## 2022-08-14 NOTE — Progress Notes (Signed)
Subjective:  At least 650 of UOP but weight is up - BUN and crt about the same.  She maybe feels a little better from a breathing standpoint but acknowledges she is not doing a whole lot.  We confirmed our conversation from yesterday that she is ready to start HD-  is NPO for AVG and TDC  Objective Vital signs in last 24 hours: Vitals:   08/13/22 2324 08/14/22 0425 08/14/22 0717 08/14/22 0804  BP: (!) 154/79 (!) 156/77 (!) 161/84   Pulse: 99 99 89   Resp: 18 16 16    Temp: 98.4 F (36.9 C) 97.9 F (36.6 C) 98.3 F (36.8 C)   TempSrc: Oral Oral Oral   SpO2: 90% 91% 91% (!) 89%  Weight:  64.5 kg    Height:       Weight change: 11 kg  Intake/Output Summary (Last 24 hours) at 08/14/2022 0827 Last data filed at 08/14/2022 0600 Gross per 24 hour  Intake 1154.08 ml  Output 650 ml  Net 504.08 ml    Assessment/Plan: 62 year old WF with history of substance abuse, HTN, hep C and poor social situation with progressive CKD-  2 hosps in the past 2 weeks , really failure to thrive 1.Renal- progressive CKD really unknown etiology but also incomplete CKD care due to social situation-  progressing to ESRD.  She has had 2 hosps in the past 2 weeks-  cannot maintain her hgb or volume status with albumin of 2.1 and many physical sxms.  I told her that I thought it would be in her best interest to start dialysis " I am ready"  Appreciate VVS to place AVG and [facility] today and to start dialysis once [facility] is in -  5/29 or 5/30 -  will also consult our team to arrange OP HD.  She did have a negative hep B surface antigen on 5/15 at [facility]  2. Hypertension/volume  - overloaded-  is making some urine-  will continue diuretics and UF as able with HD  3. Metabolic acidosis-  due to CKD- on bicarb-  dialysis will correct  4. Anemia  - severe and recurrent-  CKD is a factor but may be GI bleeding as well -   giving esa and iron-  low but stable 5. Bones-  calc low, phos high-  will check PTH and add phoslo once no longer  NPO      Monish Haliburton A Edith Lord    Labs: Basic Metabolic Panel: Recent Labs  Lab 08/12/22 1708 08/13/22 0039 08/14/22 0139  NA 132* 132* 136  K 3.4* 4.0 4.5  CL 103 102 106  CO2 14* 15* 17*  GLUCOSE 136* 149* 104*  BUN 58* 60* 64*  CREATININE 4.68* 4.78* 4.56*  CALCIUM 7.7* 7.6* 7.7*   Liver Function Tests: Recent Labs  Lab 08/12/22 1708 08/13/22 0039  AST 16 16  ALT 11 10  ALKPHOS 77 73  BILITOT 0.6 0.4  PROT 7.1 6.7  ALBUMIN 2.2* 2.1*   No results for input(s): "LIPASE", "AMYLASE" in the last 168 hours. No results for input(s): "AMMONIA" in the last 168 hours. CBC: Recent Labs  Lab 08/12/22 1708 08/13/22 0039 08/13/22 1546 08/14/22 0139  WBC 7.3 6.5  --  8.1  NEUTROABS 5.0  --   --   --   HGB 7.0* 6.6* 7.4* 7.4*  HCT 22.0* 20.4* 23.0* 23.1*  MCV 97.3 92.3  --  90.2  PLT 371 363  --  378  Cardiac Enzymes: No results for input(s): "CKTOTAL", "CKMB", "CKMBINDEX", "TROPONINI" in the last 168 hours. CBG: No results for input(s): "GLUCAP" in the last 168 hours.  Iron Studies:  Recent Labs    08/13/22 0952  IRON 21*  TIBC 280  FERRITIN 160   Studies/Results: ECHOCARDIOGRAM COMPLETE  Result Date: 08/13/2022    ECHOCARDIOGRAM REPORT   Patient Name:   [name] Date of Exam: 08/13/2022 Medical Rec #:  [id number]      Height:       67.0 in Accession #:    [id number]     Weight:       139.5 lb Date of Birth:  1960/10/06      BSA:          1.735 m Patient Age:    62 years       BP:           159/93 mmHg Patient Gender: F              HR:           87 bpm. 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 CHF, COPD, Signs/Symptoms:Shortness of Breath                 and Dyspnea; Risk  Factors:Hypertension and Current Smoker. IVDU.                 ETOH.  Sonographer:    Sheralyn Boatman RDCS Referring Phys: [id number] [name] MELVIN IMPRESSIONS  1. Left ventricular ejection fraction, by estimation, is 55 to 60%. The left ventricle has normal function. The left ventricle has no regional wall motion abnormalities. Left ventricular diastolic parameters are consistent with Grade I diastolic dysfunction (impaired relaxation).  2. Right ventricular systolic function is normal. The right ventricular size is normal. There is mildly elevated pulmonary artery systolic pressure.  3. The mitral valve is degenerative. Mild mitral valve regurgitation.  4. The aortic valve is tricuspid. Aortic valve regurgitation is not visualized. Mild aortic valve stenosis. Aortic valve mean gradient measures 12.5 mmHg. Aortic valve Vmax measures 2.37 m/s.  5. The inferior vena cava is dilated in size with <50% respiratory variability, suggesting right atrial pressure of 15 mmHg. Comparison(s): No significant change from prior study. FINDINGS  Left Ventricle: Left ventricular ejection fraction, by estimation, is 55 to 60%. The left ventricle has normal function. The left ventricle has no regional wall motion abnormalities. The left ventricular internal cavity size was normal in size. There is  no left ventricular hypertrophy. Left ventricular diastolic parameters are consistent with Grade I diastolic dysfunction (impaired relaxation). Right Ventricle: The right ventricular size is normal. No increase in right ventricular wall thickness. Right ventricular systolic function is normal. There is mildly elevated pulmonary artery systolic pressure. The tricuspid regurgitant velocity is 2.65  m/s, and with an assumed right atrial pressure of 15 mmHg, the estimated right ventricular systolic pressure is 43.1 mmHg. Left Atrium: Left atrial size was normal in size. Right Atrium: Right atrial size was normal in size. Pericardium: There is no  evidence of pericardial effusion.  Mitral Valve: The mitral valve is degenerative in appearance. Mild mitral valve regurgitation. MV peak gradient, 10.0 mmHg. The mean mitral valve gradient is 5.0 mmHg. Tricuspid Valve: The tricuspid valve is grossly normal. Tricuspid valve regurgitation is trivial. No evidence of tricuspid stenosis. Aortic Valve: The aortic valve is tricuspid. Aortic valve regurgitation is not visualized. Mild aortic stenosis is present. Aortic valve mean gradient measures 12.5 mmHg. Aortic valve peak gradient measures 22.5 mmHg. Aortic valve area, by VTI measures 1.40 cm. Pulmonic Valve: The pulmonic valve was grossly normal. Pulmonic valve regurgitation is not visualized. No evidence of pulmonic stenosis. Aorta: The aortic root and ascending aorta are structurally normal, with no evidence of dilitation. Venous: The inferior vena cava is dilated in size with less than 50% respiratory variability, suggesting right atrial pressure of 15 mmHg. IAS/Shunts: The atrial septum is grossly normal.  LEFT VENTRICLE PLAX 2D LVIDd:         5.00 cm      Diastology LVIDs:         3.70 cm      LV e' medial:    6.96 cm/s LV PW:         1.20 cm      LV E/e' medial:  20.8 LV IVS:        1.00 cm      LV e' lateral:   6.90 cm/s LVOT diam:     1.80 cm      LV E/e' lateral: 21.0 LV SV:         58 LV SV Index:   34 LVOT Area:     2.54 cm  LV Volumes (MOD) LV vol d, MOD A2C: 127.5 ml LV vol d, MOD A4C: 104.0 ml LV vol s, MOD A2C: 59.0 ml LV vol s, MOD A4C: 45.1 ml LV SV MOD A2C:     68.5 ml LV SV MOD A4C:     104.0 ml LV SV MOD BP:      64.2 ml RIGHT VENTRICLE             IVC RV S prime:     12.50 cm/s  IVC diam: 2.30 cm TAPSE (M-mode): 2.5 cm LEFT ATRIUM             Index        RIGHT ATRIUM           Index LA diam:        3.90 cm 2.25 cm/m   RA Area:     13.20 cm LA Vol (A2C):   35.3 ml 20.34 ml/m  RA Volume:   33.60 ml  19.36 ml/m LA Vol (A4C):   25.0 ml 14.41 ml/m LA Biplane Vol: 32.2 ml 18.56 ml/m  AORTIC  VALVE AV Area (Vmax):    1.42 cm AV Area (Vmean):   1.36 cm AV Area (VTI):     1.40 cm AV Vmax:           237.00 cm/s AV Vmean:          163.500 cm/s AV VTI:            0.416 m AV Peak Grad:      22.5 mmHg AV Mean Grad:      12.5 mmHg LVOT Vmax:         132.00 cm/s LVOT Vmean:        87.300 cm/s LVOT VTI:          0.229 m LVOT/AV VTI ratio: 0.55  AORTA Ao Root diam: 3.20 cm Ao Asc diam:  3.10 cm MITRAL VALVE                  TRICUSPID VALVE MV Area (PHT): 3.85 cm       TR Peak grad:   28.1 mmHg MV Area VTI:   1.39 cm       TR Vmax:        265.00 cm/s MV Peak grad:  10.0 mmHg MV Mean grad:  5.0 mmHg       SHUNTS MV Vmax:       1.58 m/s       Systemic VTI:  0.23 m MV Vmean:      107.0 cm/s     Systemic Diam: 1.80 cm MV Decel Time: 197 msec MR Peak grad:    91.4 mmHg MR Mean grad:    73.0 mmHg MR Vmax:         478.00 cm/s MR Vmean:        417.0 cm/s MR PISA:         2.26 cm MR PISA Eff ROA: 18 mm MR PISA Radius:  0.60 cm MV E velocity: 145.00 cm/s MV A velocity: 136.00 cm/s MV E/A ratio:  1.07 Lennie Odor MD Electronically signed by Lennie Odor MD Signature Date/Time: 08/13/2022/12:42:37 PM    Final    DG Chest 2 View  Result Date: 08/12/2022 CLINICAL DATA:  Suspected sepsis, fluid retention, shortness of breath, anasarca, COPD, emphysema EXAM: CHEST - 2 VIEW COMPARISON:  08/01/2022 FINDINGS: Cardiomegaly. Diffuse bilateral interstitial pulmonary opacity and right-greater-than-left small pleural effusions. Disc degenerative disease of the thoracic spine. IMPRESSION: Cardiomegaly with diffuse bilateral interstitial pulmonary opacity and right-greater-than-left small pleural effusions, consistent with edema. No focal airspace opacity. Electronically Signed   By: Jearld Lesch M.D.   On: 08/12/2022 18:12   Medications: Infusions:  cefUROXime (ZINACEF)  IV     ferric gluconate (FERRLECIT) IVPB Stopped (08/13/22 1727)    Scheduled Medications:  busPIRone  10 mg Oral BID   Chlorhexidine Gluconate Cloth   6 each Topical Q0600   darbepoetin (ARANESP) injection - NON-DIALYSIS  200 mcg Subcutaneous Q Tue-1800   ferrous sulfate  325 mg Oral Q breakfast   fluticasone furoate-vilanterol  1 puff Inhalation Daily   And   umeclidinium bromide  1 puff Inhalation Daily   furosemide  80 mg Intravenous BID   gabapentin  100 mg Oral TID   liver oil-zinc oxide   Topical BID   pantoprazole  40 mg Oral Daily   sodium bicarbonate  1,300 mg Oral TID   sodium chloride flush  3 mL Intravenous Q12H    have reviewed scheduled and prn medications.  Physical Exam: General: more calm-  coughing Heart: RRR Lungs: CBS and dec air movement  Abdomen: soft, non tender  Extremities: pitting edema  Dialysis Access: none yet   08/14/2022,8:27 AM  LOS: 1 day

## 2022-08-14 NOTE — Transfer of Care (Signed)
Immediate Anesthesia Transfer of Care Note  Patient: [name]  Procedure(s) Performed: RIGHT ARM ARTERIOVENOUS (AV) FISTULA VERSUS ARTERIOVENOUS GRAFT CREATION (Right) INSERTION OF TUNNELED DIALYSIS CATHETER USING PALINDROME CATHETER KIT 19CM (Right: Neck)  Patient Location: PACU  Anesthesia Type:General  Level of Consciousness: drowsy and patient cooperative  Airway & Oxygen Therapy: Patient Spontanous Breathing and Patient connected to face mask oxygen  Post-op Assessment: Report given to RN and Post -op Vital signs reviewed and stable  Post vital signs: Reviewed and stable  Last Vitals:  Vitals Value Taken Time  BP 146/67 08/14/22 1241  Temp    Pulse 84 08/14/22 1242  Resp 13 08/14/22 1242  SpO2 92 % 08/14/22 1242  Vitals shown include unvalidated device data.  Last Pain:  Vitals:   08/14/22 0954  TempSrc: Oral  PainSc: 0-No pain         Complications: No notable events documented.

## 2022-08-14 NOTE — Progress Notes (Signed)
PROGRESS NOTE [name]  ZOX:[id number] DOB: Nov 26, 1960 DOA: 08/12/2022 PCP: [name], MD  Brief Narrative/Hospital Course: 63 year old female with complex medical comorbidities with PAD GERD gastritis, hypertension, anemia, CKD 5, COPD, hepatitis C, Hyperlipidemia, recent admission [phone] for symptomatic anemia due to chronic GI bleeding needing 2 units PRBC, FOBT was negative, presenting with fluid retention shortness of breath, in the triage initially hypoxic 80%, noted to have anasarca in legs arms and abdomen. In the ED mildly tachycardic, he did not with nasal cannula, BP on higher side 160s to 170s mild tachypnea-24 Labs creatinine elevated 4.19 hemoglobin 7.1 g> 7.0 g, lactic acid 2.1> 0.8, BNP elevated 1084. Chest x-ray cardiomegaly and pulmonary edema with diffuse bilateral interstitial pulmonary opacities right greater than left small pleural effusion. Patient admitted working diagnosis of acute hypoxic aspiratory failure pretension in the setting of CHF exacerbation possible COPD exacerbation. Her baseline weight went has gone up but she is note sure how much. HH overnight at 6.6 g,    Subjective: Seen and examined TDC placement by vascular today am. Alert awake resting well No complaints   Assessment and Plan: Principal Problem:   Acute on chronic diastolic CHF (congestive heart failure) (HCC) Active Problems:   Chronic hepatitis C without hepatic coma (HCC)   HLD (hyperlipidemia)   Hypertension   Chronic leg pain   Anxiety and depression   Gastroesophageal reflux disease   IDA (iron deficiency anemia)   Chronic obstructive pulmonary disease (HCC)   Polysubstance abuse (HCC)   Gastritis and gastroduodenitis   PUD (peptic ulcer disease)   CKD (chronic kidney disease) stage 5, GFR less than 15 ml/min (HCC)   Acute respiratory failure with hypoxia (HCC)   Acute hypoxic respiratory failure: Likely multifactorial in the setting of acute on chronic diastolic  CHF, possible Copd exacerbation ongoing smoking, wheezing.Continue supportive oxygen, bronchodilators. Wean o2 to keep spo2 at 92-94%  Progressive CKD stage V-now progressing to ESRD,nephrology following closely planning for [facility] today, and start HD 5/29 or 5/30. Monitor renal function. Recent Labs    06/07/22 1009 06/07/22 1615 08/01/22 1308 08/02/22 0450 08/03/22 0204 08/04/22 0207 08/05/22 0149 08/12/22 1708 08/13/22 0039 08/14/22 0139  BUN 34* 37* 37* 35* 38* 42* 47* 58* 60* 64*  CREATININE 4.07* 3.97* 4.71* 4.18* 4.39* 4.22* 4.19* 4.68* 4.78* 4.56*  CO2 10* 12* 16* 16* 18* 17* 16* 14* 15* 17*    Acute on chronic diastolic WJX:BJYNW x-ray with pulmonary edema and bilateral small effusions and cardiomegaly BNP elevated 1084.    Echo from 1223 EF 55-60% with G1 DD on Lasix previously but was held due to AKI/dehydration.  Placed on IV Lasix high-dose per nephrology not planning for HD see above. Cont to monitor daily I/O, daly weight  Net IO Since Admission: 1,607.08 mL [08/14/22 1401]  Filed Weights   08/12/22 2000 08/13/22 0500 08/14/22 0425  Weight: 64.5 kg 63.3 kg 64.5 kg    Recent Labs  Lab 08/12/22 1708 08/12/22 1710 08/12/22 2140 08/13/22 0039 08/14/22 0139  BNP  --  1,084.9*  --   --   --   BUN 58*  --   --  60* 64*  CREATININE 4.68*  --   --  4.78* 4.56*  K 3.4*  --   --  4.0 4.5  MG  --   --  1.9  --   --     ?COPD exacerbation with wheezing Current active smoker: Continue bronchodilator nebs, trelegy,   Anemia multifactorial likely from  anemia of chronic kidney disease PUD/gastritis/GERD and diverticulosis FOBT+: Cont ppi. S/p 1 unit PRBC hb as as below. anemia panel done recently with low iron storage. EPO/Iron per nephro. Will get GI to see her due to concern for possible gi bleed Recent Labs    11/15/21 1228 11/15/21 1747 08/04/22 1023 08/05/22 0149 08/12/22 1708 08/13/22 0039 08/13/22 0952 08/13/22 1546 08/14/22 0139  HGB  --    < >  --  7.1*  7.0* 6.6*  --  7.4* 7.4*  MCV  --    < >  --  95.3 97.3 92.3  --   --  90.2  VITAMINB12  --   --   --   --   --   --  507  --   --   FOLATE  --   --   --   --   --   --  8.5  --   --   FERRITIN 11  --  39  --   --   --  160  --   --   TIBC 449  --   --   --   --   --  280  --   --   IRON 14*  --   --   --   --   --  21*  --   --   RETICCTPCT  --   --   --   --   --   --  2.2  --   --    < > = values in this interval not displayed.    ZOX:WRUEAVW home amlodipine in setting of initiation of Lasix/HD Substance UJW:JXBJYNW currently using marijuana only   Depression Anxiety: Mood stable ,continue home BuSpar and trazodone   Neuropathy:Continue home Gabapentin     DVT prophylaxis: Place and maintain sequential compression device Start: 08/13/22 1130 Code Status:   Code Status: Full Code Family Communication: plan of care discussed with patient at bedside. Called her daughter [name] no answer. Called her Significant Other [name] no answer either. Patient status is: Inpatient because of ESRD Level of care: Telemetry Cardiac   Dispo: The patient is from: HOME            Anticipated disposition: TBD Objective: Vitals last 24 hrs: Vitals:   08/14/22 1240 08/14/22 1255 08/14/22 1315 08/14/22 1330  BP: (!) 146/67 (!) 140/84 (!) 142/78 (!) 150/74  Pulse: 89 96 91 78  Resp: 12 15 19 12   Temp: 98.3 F (36.8 C)   98.3 F (36.8 C)  TempSrc:      SpO2: 94% 94% 93% 94%  Weight:      Height:       Weight change: 11 kg  Physical Examination: General exam: alert awake, oriented older than stated age HEENT:Oral mucosa moist, Ear/Nose WNL grossly Respiratory system: bilaterally clear TDC+ Cardiovascular system: S1 & S2 +, No JVD. Gastrointestinal system: Abdomen soft,NT,ND, BS+ Nervous System:Alert, awake, moving extremities. Extremities: LE edema MILD sensitive to touch Skin: No rashes,no icterus. MSK: Normal muscle bulk,tone, power  Medications reviewed:  Scheduled Meds:  busPIRone   10 mg Oral BID   calcium acetate  667 mg Oral TID WC   Chlorhexidine Gluconate Cloth  6 each Topical Q0600   darbepoetin (ARANESP) injection - NON-DIALYSIS  200 mcg Subcutaneous Q Tue-1800   fentaNYL       ferrous sulfate  325 mg Oral Q breakfast   fluticasone furoate-vilanterol  1 puff Inhalation Daily  And   umeclidinium bromide  1 puff Inhalation Daily   furosemide  80 mg Intravenous BID   gabapentin  100 mg Oral TID   liver oil-zinc oxide   Topical BID   pantoprazole  40 mg Oral Daily   sodium bicarbonate  1,300 mg Oral TID   sodium chloride flush  3 mL Intravenous Q12H   Continuous Infusions:  anticoagulant sodium citrate     ferric gluconate (FERRLECIT) IVPB Stopped (08/13/22 1727)      Diet Order             Diet renal with fluid restriction Fluid restriction: 1800 mL Fluid; Room service appropriate? Yes; Fluid consistency: Thin  Diet effective now                            Intake/Output Summary (Last 24 hours) at 08/14/2022 1401 Last data filed at 08/14/2022 1215 Gross per 24 hour  Intake 1232.83 ml  Output 650 ml  Net 582.83 ml   Net IO Since Admission: 1,607.08 mL [08/14/22 1401]  Wt Readings from Last 3 Encounters:  08/14/22 64.5 kg  08/04/22 57 kg  06/07/22 51.3 kg     Unresulted Labs (From admission, onward)     Start     Ordered   08/15/22 0500  PTH, intact and calcium  Tomorrow morning,   R        08/14/22 0843   08/15/22 0500  Renal function panel  Daily,   R      08/14/22 0845   08/14/22 0500  CBC  Daily,   R      08/13/22 0938   08/12/22 1708  Urinalysis, w/ Reflex to Culture (Infection Suspected) -Urine, Clean Catch  Once,   URGENT       Question:  Specimen Source  Answer:  Urine, Clean Catch   08/12/22 1707          Data Reviewed: I have personally reviewed following labs and imaging studies CBC: Recent Labs  Lab 08/12/22 1708 08/13/22 0039 08/13/22 1546 08/14/22 0139  WBC 7.3 6.5  --  8.1  NEUTROABS 5.0  --   --   --    HGB 7.0* 6.6* 7.4* 7.4*  HCT 22.0* 20.4* 23.0* 23.1*  MCV 97.3 92.3  --  90.2  PLT 371 363  --  378   Basic Metabolic Panel: Recent Labs  Lab 08/12/22 1708 08/12/22 2140 08/13/22 0039 08/14/22 0139  NA 132*  --  132* 136  K 3.4*  --  4.0 4.5  CL 103  --  102 106  CO2 14*  --  15* 17*  GLUCOSE 136*  --  149* 104*  BUN 58*  --  60* 64*  CREATININE 4.68*  --  4.78* 4.56*  CALCIUM 7.7*  --  7.6* 7.7*  MG  --  1.9  --   --   GFR: Recent Labs  Lab 08/12/22 1708 08/13/22 0039  AST 16 16  ALT 11 10  ALKPHOS 77 73  BILITOT 0.6 0.4  PROT 7.1 6.7  ALBUMIN 2.2* 2.1*   Recent Labs  Lab 08/12/22 2140  INR 1.1   Recent Labs  Lab 08/14/22 1250  GLUCAP 92   Recent Labs  Lab 08/12/22 1720 08/12/22 2140  LATICACIDVEN 2.1* 0.8   Recent Results (from the past 240 hour(s))  Culture, blood (Routine x 2)     Status: None (Preliminary result)   Collection Time:  08/12/22  5:08 PM   Specimen: BLOOD RIGHT ARM  Result Value Ref Range Status   Specimen Description BLOOD RIGHT ARM  Final   Special Requests   Final    BOTTLES DRAWN AEROBIC AND ANAEROBIC Blood Culture results may not be optimal due to an inadequate volume of blood received in culture bottles   Culture   Final    NO GROWTH 2 DAYS Performed at [facility], [address], [place], [place] [address]    Report Status PENDING  Incomplete  Culture, blood (Routine x 2)     Status: None (Preliminary result)   Collection Time: 08/12/22  5:13 PM   Specimen: BLOOD  Result Value Ref Range Status   Specimen Description BLOOD LEFT ANTECUBITAL  Final   Special Requests   Final    BOTTLES DRAWN AEROBIC AND ANAEROBIC Blood Culture adequate volume   Culture   Final    NO GROWTH 2 DAYS Performed at [facility], [address], [place], [place] [address]    Report Status PENDING  Incomplete    Antimicrobials: Anti-infectives (From admission, onward)    Start     Dose/Rate Route Frequency Ordered Stop    08/14/22 0600  cefUROXime (ZINACEF) 1.5 g in sodium chloride 0.9 % 100 mL IVPB        1.5 g 200 mL/hr over 30 Minutes Intravenous On call to O.R. 08/13/22 1243 08/14/22 1335     Culture/Microbiology    Component Value Date/Time   SDES BLOOD LEFT ANTECUBITAL 08/12/2022 1713   SPECREQUEST  08/12/2022 1713    BOTTLES DRAWN AEROBIC AND ANAEROBIC Blood Culture adequate volume   CULT  08/12/2022 1713    NO GROWTH 2 DAYS Performed at [facility], [name], [place], [place] [address]    REPTSTATUS PENDING 08/12/2022 1713   Radiology Studies: DG CHEST PORT 1 VIEW  Result Date: 08/14/2022 CLINICAL DATA:  Status post dialysis catheter insertion. EXAM: PORTABLE CHEST 1 VIEW COMPARISON:  08/12/2022 FINDINGS: Right jugular dialysis catheter has been placed. The catheter tip is in the upper SVC region. Negative for pneumothorax. Patchy airspace densities in both lungs are concerning for pulmonary edema. Increased densities in the retrocardiac space could represent pleural fluid or atelectasis. Heart size is mildly enlarged. IMPRESSION: 1. Placement of right jugular dialysis catheter with the catheter tip in the SVC region. Negative for pneumothorax. 2. Patchy airspace densities in both lungs are concerning for pulmonary edema. 3. Increased densities in the retrocardiac space could represent pleural fluid or atelectasis. Electronically Signed   By: Richarda Overlie M.D.   On: 08/14/2022 13:39   DG C-Arm 1-60 Min-No Report  Result Date: 08/14/2022 Fluoroscopy was utilized by the requesting physician.  No radiographic interpretation.   ECHOCARDIOGRAM COMPLETE  Result Date: 08/13/2022    ECHOCARDIOGRAM REPORT   Patient Name:   ALYXANDRA FOLINO Herda Date of Exam: 08/13/2022 Medical Rec #:  [id number]      Height:       67.0 in Accession #:    [id number]     Weight:       139.5 lb Date of Birth:  [birth year]      BSA:          1.735 m Patient Age:    61 years       BP:           159/93 mmHg Patient Gender: F  HR:           87 bpm. 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 CHF, COPD, Signs/Symptoms:Shortness of Breath                 and Dyspnea; Risk Factors:Hypertension and Current Smoker. IVDU.                 ETOH.  Sonographer:    Sheralyn Boatman RDCS Referring Phys: [id number] [name] MELVIN IMPRESSIONS  1. Left ventricular ejection fraction, by estimation, is 55 to 60%. The left ventricle has normal function. The left ventricle has no regional wall motion abnormalities. Left ventricular diastolic parameters are consistent with Grade I diastolic dysfunction (impaired relaxation).  2. Right ventricular systolic function is normal. The right ventricular size is normal. There is mildly elevated pulmonary artery systolic pressure.  3. The mitral valve is degenerative. Mild mitral valve regurgitation.  4. The aortic valve is tricuspid. Aortic valve regurgitation is not visualized. Mild aortic valve stenosis. Aortic valve mean gradient measures 12.5 mmHg. Aortic valve Vmax measures 2.37 m/s.  5. The inferior vena cava is dilated in size with <50% respiratory variability, suggesting right atrial pressure of 15 mmHg. Comparison(s): No significant change from prior study. FINDINGS  Left Ventricle: Left ventricular ejection fraction, by estimation, is 55 to 60%. The left ventricle has normal function. The left ventricle has no regional wall motion abnormalities. The left ventricular internal cavity size was normal in size. There is  no left ventricular hypertrophy. Left ventricular diastolic parameters are consistent with Grade I diastolic dysfunction (impaired relaxation). Right Ventricle: The right ventricular size is normal. No increase in right  ventricular wall thickness. Right ventricular systolic function is normal. There is mildly elevated pulmonary artery systolic pressure. The tricuspid regurgitant velocity is 2.65  m/s, and with an assumed right atrial pressure of 15 mmHg, the estimated right ventricular systolic pressure is 43.1 mmHg. Left Atrium: Left atrial size was normal in size. Right Atrium: Right atrial size was normal in size. Pericardium: There is no evidence of pericardial effusion. Mitral Valve: The mitral valve is degenerative in appearance. Mild mitral valve regurgitation. MV peak gradient, 10.0 mmHg. The mean mitral valve gradient is 5.0 mmHg. Tricuspid Valve: The tricuspid valve is grossly normal. Tricuspid valve regurgitation is trivial. No evidence of tricuspid stenosis. Aortic Valve: The aortic valve is tricuspid. Aortic valve regurgitation is not visualized. Mild aortic stenosis is present. Aortic valve mean gradient measures 12.5 mmHg. Aortic valve peak gradient measures 22.5 mmHg. Aortic valve area, by VTI measures 1.40 cm. Pulmonic Valve: The pulmonic valve was grossly normal. Pulmonic valve regurgitation is not visualized. No evidence of pulmonic stenosis. Aorta: The aortic root and ascending aorta are structurally normal, with no evidence of dilitation. Venous: The inferior vena cava is dilated in size with less than 50% respiratory variability, suggesting right atrial pressure of 15 mmHg. IAS/Shunts: The atrial septum is grossly normal.  LEFT VENTRICLE PLAX 2D LVIDd:         5.00 cm      Diastology LVIDs:  3.70 cm      LV e' medial:    6.96 cm/s LV PW:         1.20 cm      LV E/e' medial:  20.8 LV IVS:        1.00 cm      LV e' lateral:   6.90 cm/s LVOT diam:     1.80 cm      LV E/e' lateral: 21.0 LV SV:         58 LV SV Index:   34 LVOT Area:     2.54 cm  LV Volumes (MOD) LV vol d, MOD A2C: 127.5 ml LV vol d, MOD A4C: 104.0 ml LV vol s, MOD A2C: 59.0 ml LV vol s, MOD A4C: 45.1 ml LV SV MOD A2C:     68.5 ml LV SV  MOD A4C:     104.0 ml LV SV MOD BP:      64.2 ml RIGHT VENTRICLE             IVC RV S prime:     12.50 cm/s  IVC diam: 2.30 cm TAPSE (M-mode): 2.5 cm LEFT ATRIUM             Index        RIGHT ATRIUM           Index LA diam:        3.90 cm 2.25 cm/m   RA Area:     13.20 cm LA Vol (A2C):   35.3 ml 20.34 ml/m  RA Volume:   33.60 ml  19.36 ml/m LA Vol (A4C):   25.0 ml 14.41 ml/m LA Biplane Vol: 32.2 ml 18.56 ml/m  AORTIC VALVE AV Area (Vmax):    1.42 cm AV Area (Vmean):   1.36 cm AV Area (VTI):     1.40 cm AV Vmax:           237.00 cm/s AV Vmean:          163.500 cm/s AV VTI:            0.416 m AV Peak Grad:      22.5 mmHg AV Mean Grad:      12.5 mmHg LVOT Vmax:         132.00 cm/s LVOT Vmean:        87.300 cm/s LVOT VTI:          0.229 m LVOT/AV VTI ratio: 0.55  AORTA Ao Root diam: 3.20 cm Ao Asc diam:  3.10 cm MITRAL VALVE                  TRICUSPID VALVE MV Area (PHT): 3.85 cm       TR Peak grad:   28.1 mmHg MV Area VTI:   1.39 cm       TR Vmax:        265.00 cm/s MV Peak grad:  10.0 mmHg MV Mean grad:  5.0 mmHg       SHUNTS MV Vmax:       1.58 m/s       Systemic VTI:  0.23 m MV Vmean:      107.0 cm/s     Systemic Diam: 1.80 cm MV Decel Time: 197 msec MR Peak grad:    91.4 mmHg MR Mean grad:    73.0 mmHg MR Vmax:         478.00 cm/s MR Vmean:        417.0 cm/s MR PISA:  2.26 cm MR PISA Eff ROA: 18 mm MR PISA Radius:  0.60 cm MV E velocity: 145.00 cm/s MV A velocity: 136.00 cm/s MV E/A ratio:  1.07 Lennie Odor MD Electronically signed by Lennie Odor MD Signature Date/Time: 08/13/2022/12:42:37 PM    Final    DG Chest 2 View  Result Date: 08/12/2022 CLINICAL DATA:  Suspected sepsis, fluid retention, shortness of breath, anasarca, COPD, emphysema EXAM: CHEST - 2 VIEW COMPARISON:  08/01/2022 FINDINGS: Cardiomegaly. Diffuse bilateral interstitial pulmonary opacity and right-greater-than-left small pleural effusions. Disc degenerative disease of the thoracic spine. IMPRESSION: Cardiomegaly with  diffuse bilateral interstitial pulmonary opacity and right-greater-than-left small pleural effusions, consistent with edema. No focal airspace opacity. Electronically Signed   By: Jearld Lesch M.D.   On: 08/12/2022 18:12    LOS: 1 day  Lanae Boast, MD [facility] 08/14/2022, 2:01 PM

## 2022-08-14 NOTE — Anesthesia Postprocedure Evaluation (Signed)
Anesthesia Post Note  Patient: [name]  Procedure(s) Performed: RIGHT ARM BRACHIOBASILIC ATERIOVENOUS FISTULA CREATION (Right: Arm Upper) INSERTION OF TUNNELED DIALYSIS CATHETER USING PALINDROME CATHETER KIT 19CM (Right: Neck)     Patient location during evaluation: PACU Anesthesia Type: General Level of consciousness: awake and alert, patient cooperative and oriented Pain management: pain level controlled Vital Signs Assessment: post-procedure vital signs reviewed and stable Respiratory status: spontaneous breathing, nonlabored ventilation, respiratory function stable and patient connected to nasal cannula oxygen Cardiovascular status: blood pressure returned to baseline and stable Postop Assessment: no apparent nausea or vomiting Anesthetic complications: no   No notable events documented.  Last Vitals:  Vitals:   08/14/22 1414 08/14/22 1428  BP: (!) 148/80   Pulse: 83   Resp: 17   Temp: (!) 36.4 C   SpO2: 90% 90%    Last Pain:  Vitals:   08/14/22 1414  TempSrc: Oral  PainSc:                  Kennie Karapetian,E. Jiyah Torpey

## 2022-08-14 NOTE — Anesthesia Procedure Notes (Signed)
Procedure Name: Intubation Date/Time: 08/14/2022 11:04 AM  Performed by: Gus Puma, CRNAPre-anesthesia Checklist: Patient identified, Emergency Drugs available, Suction available and Patient being monitored Patient Re-evaluated:Patient Re-evaluated prior to induction Oxygen Delivery Method: Circle System Utilized Preoxygenation: Pre-oxygenation with 100% oxygen Induction Type: IV induction Ventilation: Mask ventilation without difficulty Laryngoscope Size: Mac and 3 Grade View: Grade I Tube type: Oral Tube size: 7.0 mm Number of attempts: 1 Airway Equipment and Method: Stylet Placement Confirmation: ETT inserted through vocal cords under direct vision, positive ETCO2 and breath sounds checked- equal and bilateral Secured at: 21 cm Tube secured with: Tape Dental Injury: Teeth and Oropharynx as per pre-operative assessment

## 2022-08-15 ENCOUNTER — Inpatient Hospital Stay (HOSPITAL_COMMUNITY): Payer: Medicare Other

## 2022-08-15 ENCOUNTER — Encounter (HOSPITAL_COMMUNITY): Payer: Self-pay | Admitting: Vascular Surgery

## 2022-08-15 DIAGNOSIS — D649 Anemia, unspecified: Secondary | ICD-10-CM

## 2022-08-15 DIAGNOSIS — Z7189 Other specified counseling: Secondary | ICD-10-CM

## 2022-08-15 DIAGNOSIS — I509 Heart failure, unspecified: Secondary | ICD-10-CM

## 2022-08-15 DIAGNOSIS — R0902 Hypoxemia: Secondary | ICD-10-CM

## 2022-08-15 DIAGNOSIS — Z515 Encounter for palliative care: Secondary | ICD-10-CM

## 2022-08-15 LAB — CBC
HCT: 23.4 % — ABNORMAL LOW (ref 36.0–46.0)
Hemoglobin: 7.3 g/dL — ABNORMAL LOW (ref 12.0–15.0)
MCH: 28.5 pg (ref 26.0–34.0)
MCHC: 31.2 g/dL (ref 30.0–36.0)
MCV: 91.4 fL (ref 80.0–100.0)
Platelets: 329 10*3/uL (ref 150–400)
RBC: 2.56 MIL/uL — ABNORMAL LOW (ref 3.87–5.11)
RDW: 19.6 % — ABNORMAL HIGH (ref 11.5–15.5)
WBC: 6.9 10*3/uL (ref 4.0–10.5)
nRBC: 0.6 % — ABNORMAL HIGH (ref 0.0–0.2)

## 2022-08-15 LAB — SARS CORONAVIRUS 2 BY RT PCR: SARS Coronavirus 2 by RT PCR: NEGATIVE

## 2022-08-15 LAB — URINALYSIS, W/ REFLEX TO CULTURE (INFECTION SUSPECTED)
Bilirubin Urine: NEGATIVE
Glucose, UA: NEGATIVE mg/dL
Ketones, ur: NEGATIVE mg/dL
Nitrite: NEGATIVE
Protein, ur: 100 mg/dL — AB
Specific Gravity, Urine: 1.006 (ref 1.005–1.030)
WBC, UA: >50 WBC/hpf (ref 0–5)
pH: 6 (ref 5.0–8.0)

## 2022-08-15 LAB — RENAL FUNCTION PANEL
Albumin: 2 g/dL — ABNORMAL LOW (ref 3.5–5.0)
Anion gap: 11 (ref 5–15)
BUN: 35 mg/dL — ABNORMAL HIGH (ref 8–23)
CO2: 23 mmol/L (ref 22–32)
Calcium: 7.8 mg/dL — ABNORMAL LOW (ref 8.9–10.3)
Chloride: 100 mmol/L (ref 98–111)
Creatinine, Ser: 3.16 mg/dL — ABNORMAL HIGH (ref 0.44–1.00)
GFR, Estimated: 16 mL/min — ABNORMAL LOW (ref >60)
Glucose, Bld: 92 mg/dL (ref 70–99)
Phosphorus: 4.2 mg/dL (ref 2.5–4.6)
Potassium: 3.9 mmol/L (ref 3.5–5.1)
Sodium: 134 mmol/L — ABNORMAL LOW (ref 135–145)

## 2022-08-15 LAB — RESPIRATORY PANEL BY PCR

## 2022-08-15 LAB — PROCALCITONIN: Procalcitonin: 0.38 ng/mL

## 2022-08-15 LAB — CULTURE, BLOOD (ROUTINE X 2)

## 2022-08-15 MED ORDER — SODIUM CHLORIDE 0.9 % IV SOLN
1.0000 g | INTRAVENOUS | Status: DC
  Administered 2022-08-15 – 2022-08-16 (×2): 1 g via INTRAVENOUS
  Filled 2022-08-15 (×2): qty 10

## 2022-08-15 MED ORDER — HYDROMORPHONE HCL 1 MG/ML IJ SOLN
1.0000 mg | Freq: Once | INTRAMUSCULAR | Status: AC
  Administered 2022-08-15: 1 mg via INTRAVENOUS
  Filled 2022-08-15: qty 1

## 2022-08-15 MED ORDER — AZITHROMYCIN 250 MG PO TABS
500.0000 mg | ORAL_TABLET | Freq: Every day | ORAL | Status: DC
  Administered 2022-08-15 – 2022-08-17 (×3): 500 mg via ORAL
  Filled 2022-08-15 (×3): qty 2

## 2022-08-15 MED ORDER — ARFORMOTEROL TARTRATE 15 MCG/2ML IN NEBU
15.0000 ug | INHALATION_SOLUTION | Freq: Two times a day (BID) | RESPIRATORY_TRACT | Status: DC
  Administered 2022-08-15 – 2022-08-20 (×9): 15 ug via RESPIRATORY_TRACT
  Filled 2022-08-15 (×11): qty 2

## 2022-08-15 MED ORDER — SODIUM CHLORIDE 0.9 % IV SOLN: 500.0000 mg | INTRAVENOUS | Status: DC

## 2022-08-15 MED ORDER — REVEFENACIN 175 MCG/3ML IN SOLN
175.0000 ug | Freq: Every day | RESPIRATORY_TRACT | Status: DC
  Administered 2022-08-16 – 2022-08-20 (×5): 175 ug via RESPIRATORY_TRACT
  Filled 2022-08-15 (×6): qty 3

## 2022-08-15 MED ORDER — BUDESONIDE 0.25 MG/2ML IN SUSP
0.2500 mg | Freq: Two times a day (BID) | RESPIRATORY_TRACT | Status: DC
  Administered 2022-08-15 – 2022-08-20 (×9): 0.25 mg via RESPIRATORY_TRACT
  Filled 2022-08-15 (×11): qty 2

## 2022-08-15 MED ORDER — CHLORHEXIDINE GLUCONATE CLOTH 2 % EX PADS
6.0000 | MEDICATED_PAD | Freq: Every day | CUTANEOUS | Status: DC
  Administered 2022-08-16 – 2022-08-17 (×2): 6 via TOPICAL

## 2022-08-15 MED ORDER — METHYLPREDNISOLONE SODIUM SUCC 125 MG IJ SOLR
80.0000 mg | INTRAMUSCULAR | Status: DC
  Administered 2022-08-15 – 2022-08-16 (×2): 80 mg via INTRAVENOUS
  Filled 2022-08-15 (×2): qty 2

## 2022-08-15 NOTE — Progress Notes (Signed)
PROGRESS NOTE [name]  ZOX:[id number] DOB: [birth year] DOA: 08/12/2022 PCP: [name], MD  Brief Narrative/Hospital Course: 62 year old female with complex medical comorbidities with PAD GERD gastritis, hypertension, anemia, CKD 5, COPD, hepatitis C, Hyperlipidemia, recent admission [phone] for symptomatic anemia due to chronic GI bleeding needing 2 units PRBC, FOBT was negative, presenting with fluid retention shortness of breath, in the triage initially hypoxic 80%, noted to have anasarca in legs arms and abdomen. In the ED mildly tachycardic, he did not with nasal cannula, BP on higher side 160s to 170s mild tachypnea-24 Labs creatinine elevated 4.19 hemoglobin 7.1 g> 7.0 g, lactic acid 2.1> 0.8, BNP elevated 1084. Chest x-ray cardiomegaly and pulmonary edema with diffuse bilateral interstitial pulmonary opacities right greater than left small pleural effusion. Patient admitted working diagnosis of acute hypoxic aspiratory failure pretension in the setting of CHF exacerbation possible COPD exacerbation. Her baseline weight went has gone up but she is note sure how much. HH overnight at 6.6 g,    Subjective: Patient seen and examined this morning Overnight afebrile, has been needing higher flow nasal cannula after TDC placement yesterday  Nurse reports at times get anxious and oxygen drops Chest x-ray ordered this morning also having some wheezing Labs this morning creatinine down to 3.1-following HD 5/29, hemoglobin 7.3 g   Assessment and Plan: Principal Problem:   Acute on chronic diastolic CHF (congestive heart failure) (HCC) Active Problems:   Chronic hepatitis C without hepatic coma (HCC)   HLD (hyperlipidemia)   Hypertension   Chronic leg pain   Anxiety and depression   Gastroesophageal reflux disease   IDA (iron deficiency anemia)   Chronic obstructive pulmonary disease (HCC)   Polysubstance abuse (HCC)   Gastritis and gastroduodenitis   PUD (peptic ulcer  disease)   CKD (chronic kidney disease) stage 5, GFR less than 15 ml/min (HCC)   Acute respiratory failure with hypoxia (HCC)   Heme positive stool   Acute on chronic anemia   Acute hypoxic respiratory failure: Likely multifactorial in the setting of acute on chronic diastolic CHF, fluid overload with ESRD, COPD.  Continue to address volume status with diuretics/dialysis per nephrology, bronchodilators, continue supplemental oxygen and wean as tolerated.  Getting chest x-ray this morning and adding IV Solu-Medrol as she is needing HFNC sicne TDC palcement. On 10 l this am Chest x-ray with cardiomegaly, pulmonary vascular congestion diffuse interstitial airspace opacities, probable layering pleural effusion and pulmonary edema overall mildly worsened compared to May 29. We will get PCCM consultation.  Progressive CKD stage V-now progressing to ESRD,nephrology following closely s/p [facility] and HD initiated 5/29> another HD 5/31 cont per nephro. Metabolic acidosis in the setting of CKD/ESRD, improved,monitor  Acute pulmonary edema Acute on chronic diastolic CHF and fluid overload due to ESRD: cxr with pulmonary edema and bilateral small effusions and cardiomegaly BNP elevated 1084Echo from 1223 EF 55-60% with G1 DD on Lasix previously but was held due to AKI/dehydration.  Placed on IV Lasix high-dose per nephrology now on HD. Cont to monitor daily I/O, daly weight  Net IO Since Admission: -772.92 mL [08/15/22 1119]  Filed Weights   08/14/22 1519 08/14/22 1815 08/15/22 0604  Weight: 65.8 kg 64.7 kg 70.3 kg    Recent Labs  Lab 08/12/22 1708 08/12/22 1710 08/12/22 2140 08/13/22 0039 08/14/22 0139 08/15/22 0104  BNP  --  1,084.9*  --   --   --   --   BUN 58*  --   --  60* 64*  35*  CREATININE 4.68*  --   --  4.78* 4.56* 3.16*  K 3.4*  --   --  4.0 4.5 3.9  MG  --   --  1.9  --   --   --     Acute COPD exacerbation with wheezing Current active smoker: Adding IV Solu-Medrol, continue  bronchodilator nebs, trelegy and Jacona o2.   Anemia multifactorial likely from anemia of chronic kidney disease PUD/gastritis/GERD and diverticulosis FOBT+ Acute blood loss anemia: s/p 1 unit PRBC hb as as below. anemia panel done recently with low iron storage ON EPO/ IV Iron per nephro. Cont ppi.GI consulted 5/29 her previous workup had consider the role of video capsule endoscopy,contemplating on a small bowel enteroscopy with possible video capsule endoscopy, will monitor H&H and transfuse as needed.   Recent Labs    11/15/21 1228 11/15/21 1747 08/04/22 1023 08/05/22 0149 08/12/22 1708 08/13/22 0039 08/13/22 0952 08/13/22 1546 08/14/22 0139 08/15/22 0104  HGB  --    < >  --    < > 7.0* 6.6*  --  7.4* 7.4* 7.3*  MCV  --    < >  --    < > 97.3 92.3  --   --  90.2 91.4  VITAMINB12  --   --   --   --   --   --  507  --   --   --   FOLATE  --   --   --   --   --   --  8.5  --   --   --   FERRITIN 11  --  39  --   --   --  160  --   --   --   TIBC 449  --   --   --   --   --  280  --   --   --   IRON 14*  --   --   --   --   --  21*  --   --   --   RETICCTPCT  --   --   --   --   --   --  2.2  --   --   --    < > = values in this interval not displayed.    HTN:BP on higher side allow high bp for fluid removal/HD and IV Lasix, holding home amlodipine Substance ZOX:WRUEAVW currently using marijuana only. Depression/Anxiety:Mood stable ,continue home BuSpar and trazodone Neuropathy:Continue home Gabapentin, she also takes Percocet 1 every 8 hours at home.   UJW:JXBJYNWGN full code.Palliative will be consulted-daughter aware how sick she is-does not want to change to DNR for now, encourage discussed with palliative care. She has complex medical comorbidity and prognosis does not appear bright    DVT prophylaxis: Place and maintain sequential compression device Start: 08/13/22 1130 Code Status:   Code Status: Full Code Family Communication: plan of care discussed with patient at bedside.  I called her daughter [name] and [name] ( significant other- friend) 5/29 no answer.  I called her other daughter  [name] [phone]  and upated.   Significant Other [name] no answer either. Patient status is: Inpatient because of ESRD Level of care: Telemetry Cardiac   Dispo: The patient is from: HOME            Anticipated disposition: TBD Objective: Vitals last 24 hrs: Vitals:   08/15/22 0250 08/15/22 0604 08/15/22 0720 08/15/22 0752  BP: Marland Kitchen)  151/83 (!) 156/77 (!) 174/96   Pulse: 87 88 85 95  Resp:  18 18 20   Temp:  98.3 F (36.8 C) 98.4 F (36.9 C)   TempSrc:  Oral Oral   SpO2: 94% 93% 90% 90%  Weight:  70.3 kg    Height:       Weight change: 1.3 kg  Physical Examination: General exam: Alert awake appears anxious on high flow nasal cannula complains of shortness of breath  HEENT:Oral mucosa moist, Ear/Nose WNL grossly, dentition normal. Respiratory system: bilaterally diminished BS w/ wheezes,no use of accessory muscle, TDC+ tender at site. Cardiovascular system: S1 & S2 +, regular rate. Gastrointestinal system: Abdomen soft, NT,ND,BS+ Nervous System:Alert, awake, moving extremities and grossly nonfocal Extremities: LE ankle edema trace, lower extremities warm Skin: No rashes,no icterus. MSK: thin and weak muscle bulk,tone, power   Medications reviewed:  Scheduled Meds:  busPIRone  10 mg Oral BID   calcium acetate  667 mg Oral TID WC   Chlorhexidine Gluconate Cloth  6 each Topical Q0600   darbepoetin (ARANESP) injection - NON-DIALYSIS  200 mcg Subcutaneous Q Tue-1800   ferrous sulfate  325 mg Oral Q breakfast   fluticasone furoate-vilanterol  1 puff Inhalation Daily   And   umeclidinium bromide  1 puff Inhalation Daily   furosemide  80 mg Intravenous BID   gabapentin  100 mg Oral TID   liver oil-zinc oxide   Topical BID   methylPREDNISolone (SOLU-MEDROL) injection  80 mg Intravenous Q24H   pantoprazole  40 mg Oral Daily   sodium bicarbonate  1,300 mg Oral TID    sodium chloride flush  3 mL Intravenous Q12H   Continuous Infusions:  ferric gluconate (FERRLECIT) IVPB 250 mg (08/15/22 0856)    Diet Order             Diet renal with fluid restriction Fluid restriction: 1800 mL Fluid; Room service appropriate? Yes; Fluid consistency: Thin  Diet effective now                   Intake/Output Summary (Last 24 hours) at 08/15/2022 1119 Last data filed at 08/15/2022 0300 Gross per 24 hour  Intake 820 ml  Output 2700 ml  Net -1880 ml   Net IO Since Admission: -772.92 mL [08/15/22 1119]  Wt Readings from Last 3 Encounters:  08/15/22 70.3 kg  08/04/22 57 kg  06/07/22 51.3 kg     Unresulted Labs (From admission, onward)     Start     Ordered   08/15/22 0500  PTH, intact and calcium  Tomorrow morning,   R        08/14/22 0843   08/15/22 0500  Renal function panel  Daily,   R      08/14/22 0845   08/14/22 0500  CBC  Daily,   R      08/13/22 0938   08/12/22 1708  Urinalysis, w/ Reflex to Culture (Infection Suspected) -Urine, Clean Catch  Once,   URGENT       Question:  Specimen Source  Answer:  Urine, Clean Catch   08/12/22 1707          Data Reviewed: I have personally reviewed following labs and imaging studies CBC: Recent Labs  Lab 08/12/22 1708 08/13/22 0039 08/13/22 1546 08/14/22 0139 08/15/22 0104  WBC 7.3 6.5  --  8.1 6.9  NEUTROABS 5.0  --   --   --   --   HGB 7.0* 6.6* 7.4* 7.4* 7.3*  HCT 22.0* 20.4* 23.0* 23.1* 23.4*  MCV 97.3 92.3  --  90.2 91.4  PLT 371 363  --  378 329   Basic Metabolic Panel: Recent Labs  Lab 08/12/22 1708 08/12/22 2140 08/13/22 0039 08/14/22 0139 08/15/22 0104  NA 132*  --  132* 136 134*  K 3.4*  --  4.0 4.5 3.9  CL 103  --  102 106 100  CO2 14*  --  15* 17* 23  GLUCOSE 136*  --  149* 104* 92  BUN 58*  --  60* 64* 35*  CREATININE 4.68*  --  4.78* 4.56* 3.16*  CALCIUM 7.7*  --  7.6* 7.7* 7.8*  MG  --  1.9  --   --   --   PHOS  --   --   --   --  4.2  GFR: Recent Labs  Lab  08/12/22 1708 08/13/22 0039 08/15/22 0104  AST 16 16  --   ALT 11 10  --   ALKPHOS 77 73  --   BILITOT 0.6 0.4  --   PROT 7.1 6.7  --   ALBUMIN 2.2* 2.1* 2.0*   Recent Labs  Lab 08/12/22 2140  INR 1.1   Recent Labs  Lab 08/14/22 1250  GLUCAP 92   Recent Labs  Lab 08/12/22 1720 08/12/22 2140  LATICACIDVEN 2.1* 0.8   Recent Results (from the past 240 hour(s))  Culture, blood (Routine x 2)     Status: None (Preliminary result)   Collection Time: 08/12/22  5:08 PM   Specimen: BLOOD RIGHT ARM  Result Value Ref Range Status   Specimen Description BLOOD RIGHT ARM  Final   Special Requests   Final    BOTTLES DRAWN AEROBIC AND ANAEROBIC Blood Culture results may not be optimal due to an inadequate volume of blood received in culture bottles   Culture   Final    NO GROWTH 3 DAYS Performed at [facility], [address], [place], [place] [address]    Report Status PENDING  Incomplete  Culture, blood (Routine x 2)     Status: None (Preliminary result)   Collection Time: 08/12/22  5:13 PM   Specimen: BLOOD  Result Value Ref Range Status   Specimen Description BLOOD LEFT ANTECUBITAL  Final   Special Requests   Final    BOTTLES DRAWN AEROBIC AND ANAEROBIC Blood Culture adequate volume   Culture   Final    NO GROWTH 3 DAYS Performed at [facility], [address], [place], [place] [address]    Report Status PENDING  Incomplete    Antimicrobials: Anti-infectives (From admission, onward)    Start     Dose/Rate Route Frequency Ordered Stop   08/14/22 0600  cefUROXime (ZINACEF) 1.5 g in sodium chloride 0.9 % 100 mL IVPB        1.5 g 200 mL/hr over 30 Minutes Intravenous On call to O.R. 08/13/22 1243 08/14/22 1335     Culture/Microbiology    Component Value Date/Time   SDES BLOOD LEFT ANTECUBITAL 08/12/2022 1713   SPECREQUEST  08/12/2022 1713    BOTTLES DRAWN AEROBIC AND ANAEROBIC Blood Culture adequate volume   CULT  08/12/2022 1713    NO GROWTH 3  DAYS Performed at [facility], [address] [place], [place] [address]    REPTSTATUS PENDING 08/12/2022 1713   Radiology Studies: DG CHEST PORT 1 VIEW  Result Date: 08/14/2022 CLINICAL DATA:  Status post dialysis catheter insertion.  EXAM: PORTABLE CHEST 1 VIEW COMPARISON:  08/12/2022 FINDINGS: Right jugular dialysis catheter has been placed. The catheter tip is in the upper SVC region. Negative for pneumothorax. Patchy airspace densities in both lungs are concerning for pulmonary edema. Increased densities in the retrocardiac space could represent pleural fluid or atelectasis. Heart size is mildly enlarged. IMPRESSION: 1. Placement of right jugular dialysis catheter with the catheter tip in the SVC region. Negative for pneumothorax. 2. Patchy airspace densities in both lungs are concerning for pulmonary edema. 3. Increased densities in the retrocardiac space could represent pleural fluid or atelectasis. Electronically Signed   By: Richarda Overlie M.D.   On: 08/14/2022 13:39   DG C-Arm 1-60 Min-No Report  Result Date: 08/14/2022 Fluoroscopy was utilized by the requesting physician.  No radiographic interpretation.   ECHOCARDIOGRAM COMPLETE  Result Date: 08/13/2022    ECHOCARDIOGRAM REPORT   Patient Name:   TOKIE ZEMP Griffith Date of Exam: 08/13/2022 Medical Rec #:  [id number]      Height:       67.0 in Accession #:    [id number]     Weight:       139.5 lb Date of Birth:  05/14/1960      BSA:          1.735 m Patient Age:    61 years       BP:           159/93 mmHg Patient Gender: F              HR:           87 bpm. 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 CHF, COPD, Signs/Symptoms:Shortness of Breath                 and Dyspnea;  Risk Factors:Hypertension and Current Smoker. IVDU.                 ETOH.  Sonographer:    Sheralyn Boatman RDCS Referring Phys: [id number] [name] MELVIN IMPRESSIONS  1. Left ventricular ejection fraction, by estimation, is 55 to 60%. The left ventricle has normal function. The left ventricle has no regional wall motion abnormalities. Left ventricular diastolic parameters are consistent with Grade I diastolic dysfunction (impaired relaxation).  2. Right ventricular systolic function is normal. The right ventricular size is normal. There is mildly elevated pulmonary artery systolic pressure.  3. The mitral valve is degenerative. Mild mitral valve regurgitation.  4. The aortic valve is tricuspid. Aortic valve regurgitation is not visualized. Mild aortic valve stenosis. Aortic valve mean gradient measures 12.5 mmHg. Aortic valve Vmax measures 2.37 m/s.  5. The inferior vena cava is dilated in size with <50% respiratory variability, suggesting right atrial pressure of 15 mmHg. Comparison(s): No significant change from prior study. FINDINGS  Left Ventricle: Left ventricular ejection fraction, by estimation, is 55 to 60%. The left ventricle has normal function. The left ventricle has no regional wall motion abnormalities. The left ventricular internal cavity size was normal in size. There is  no left ventricular hypertrophy. Left ventricular diastolic parameters are consistent with  Grade I diastolic dysfunction (impaired relaxation). Right Ventricle: The right ventricular size is normal. No increase in right ventricular wall thickness. Right ventricular systolic function is normal. There is mildly elevated pulmonary artery systolic pressure. The tricuspid regurgitant velocity is 2.65  m/s, and with an assumed right atrial pressure of 15 mmHg, the estimated right ventricular systolic pressure is 43.1 mmHg. Left Atrium: Left atrial size was normal in size. Right Atrium: Right atrial size was normal in size. Pericardium: There is  no evidence of pericardial effusion. Mitral Valve: The mitral valve is degenerative in appearance. Mild mitral valve regurgitation. MV peak gradient, 10.0 mmHg. The mean mitral valve gradient is 5.0 mmHg. Tricuspid Valve: The tricuspid valve is grossly normal. Tricuspid valve regurgitation is trivial. No evidence of tricuspid stenosis. Aortic Valve: The aortic valve is tricuspid. Aortic valve regurgitation is not visualized. Mild aortic stenosis is present. Aortic valve mean gradient measures 12.5 mmHg. Aortic valve peak gradient measures 22.5 mmHg. Aortic valve area, by VTI measures 1.40 cm. Pulmonic Valve: The pulmonic valve was grossly normal. Pulmonic valve regurgitation is not visualized. No evidence of pulmonic stenosis. Aorta: The aortic root and ascending aorta are structurally normal, with no evidence of dilitation. Venous: The inferior vena cava is dilated in size with less than 50% respiratory variability, suggesting right atrial pressure of 15 mmHg. IAS/Shunts: The atrial septum is grossly normal.  LEFT VENTRICLE PLAX 2D LVIDd:         5.00 cm      Diastology LVIDs:         3.70 cm      LV e' medial:    6.96 cm/s LV PW:         1.20 cm      LV E/e' medial:  20.8 LV IVS:        1.00 cm      LV e' lateral:   6.90 cm/s LVOT diam:     1.80 cm      LV E/e' lateral: 21.0 LV SV:         58 LV SV Index:   34 LVOT Area:     2.54 cm  LV Volumes (MOD) LV vol d, MOD A2C: 127.5 ml LV vol d, MOD A4C: 104.0 ml LV vol s, MOD A2C: 59.0 ml LV vol s, MOD A4C: 45.1 ml LV SV MOD A2C:     68.5 ml LV SV MOD A4C:     104.0 ml LV SV MOD BP:      64.2 ml RIGHT VENTRICLE             IVC RV S prime:     12.50 cm/s  IVC diam: 2.30 cm TAPSE (M-mode): 2.5 cm LEFT ATRIUM             Index        RIGHT ATRIUM           Index LA diam:        3.90 cm 2.25 cm/m   RA Area:     13.20 cm LA Vol (A2C):   35.3 ml 20.34 ml/m  RA Volume:   33.60 ml  19.36 ml/m LA Vol (A4C):   25.0 ml 14.41 ml/m LA Biplane Vol: 32.2 ml 18.56 ml/m  AORTIC  VALVE AV Area (Vmax):    1.42 cm AV Area (Vmean):   1.36 cm AV Area (VTI):     1.40 cm AV Vmax:           237.00 cm/s  AV Vmean:          163.500 cm/s AV VTI:            0.416 m AV Peak Grad:      22.5 mmHg AV Mean Grad:      12.5 mmHg LVOT Vmax:         132.00 cm/s LVOT Vmean:        87.300 cm/s LVOT VTI:          0.229 m LVOT/AV VTI ratio: 0.55  AORTA Ao Root diam: 3.20 cm Ao Asc diam:  3.10 cm MITRAL VALVE                  TRICUSPID VALVE MV Area (PHT): 3.85 cm       TR Peak grad:   28.1 mmHg MV Area VTI:   1.39 cm       TR Vmax:        265.00 cm/s MV Peak grad:  10.0 mmHg MV Mean grad:  5.0 mmHg       SHUNTS MV Vmax:       1.58 m/s       Systemic VTI:  0.23 m MV Vmean:      107.0 cm/s     Systemic Diam: 1.80 cm MV Decel Time: 197 msec MR Peak grad:    91.4 mmHg MR Mean grad:    73.0 mmHg MR Vmax:         478.00 cm/s MR Vmean:        417.0 cm/s MR PISA:         2.26 cm MR PISA Eff ROA: 18 mm MR PISA Radius:  0.60 cm MV E velocity: 145.00 cm/s MV A velocity: 136.00 cm/s MV E/A ratio:  1.07 Lennie Odor MD Electronically signed by Lennie Odor MD Signature Date/Time: 08/13/2022/12:42:37 PM    Final     LOS: 2 days  Lanae Boast, MD  [facility] 08/15/2022, 11:19 AM

## 2022-08-15 NOTE — Anesthesia Preprocedure Evaluation (Addendum)
Anesthesia Evaluation  Patient identified by MRN, date of birth, ID band Patient awake    Reviewed: Allergy & Precautions, NPO status , Patient's Chart, lab work & pertinent test results  Airway Mallampati: III  TM Distance: >3 FB Neck ROM: Full    Dental  (+) Edentulous Lower, Edentulous Upper, Dental Advisory Given   Pulmonary asthma , COPD,  COPD inhaler, Current Smoker and Patient abstained from smoking.   + rhonchi  + decreased breath sounds      Cardiovascular hypertension, +CHF  Normal cardiovascular exam Rhythm:Regular Rate:Normal  TTE 2024  1. Left ventricular ejection fraction, by estimation, is 55 to 60%. The  left ventricle has normal function. The left ventricle has no regional  wall motion abnormalities. Left ventricular diastolic parameters are  consistent with Grade I diastolic  dysfunction (impaired relaxation).   2. Right ventricular systolic function is normal. The right ventricular  size is normal. There is mildly elevated pulmonary artery systolic  pressure.   3. The mitral valve is degenerative. Mild mitral valve regurgitation.   4. The aortic valve is tricuspid. Aortic valve regurgitation is not  visualized. Mild aortic valve stenosis. Aortic valve mean gradient  measures 12.5 mmHg. Aortic valve Vmax measures 2.37 m/s.   5. The inferior vena cava is dilated in size with <50% respiratory  variability, suggesting right atrial pressure of 15 mmHg.      Neuro/Psych  PSYCHIATRIC DISORDERS Anxiety Depression    negative neurological ROS     GI/Hepatic PUD,GERD  ,,(+)     substance abuse  cocaine use, Hepatitis -, C  Endo/Other  diabetes    Renal/GU Renal InsufficiencyRenal diseaseLab Results      Component                Value               Date                      CREATININE               3.16 (H)            08/15/2022                BUN                      35 (H)              08/15/2022                 NA                       134 (L)             08/15/2022                K                        3.9                 08/15/2022                CL                       100                 08/15/2022  CO2                      23                  08/15/2022             negative genitourinary   Musculoskeletal negative musculoskeletal ROS (+)    Abdominal   Peds  Hematology  (+) Blood dyscrasia (Hgb 7.3), anemia Lab Results      Component                Value               Date                      WBC                      9.2                 08/16/2022                HGB                      8.3 (L)             08/16/2022                HCT                      26.3 (L)            08/16/2022                MCV                      92.9                08/16/2022                PLT                      360                 08/16/2022              Anesthesia Other Findings   Reproductive/Obstetrics                             Anesthesia Physical Anesthesia Plan  ASA: 3  Anesthesia Plan: MAC   Post-op Pain Management:    Induction: Intravenous  PONV Risk Score and Plan: Propofol infusion and Treatment may vary due to age or medical condition  Airway Management Planned: Natural Airway  Additional Equipment:   Intra-op Plan:   Post-operative Plan:   Informed Consent: I have reviewed the patients History and Physical, chart, labs and discussed the procedure including the risks, benefits and alternatives for the proposed anesthesia with the patient or authorized representative who has indicated his/her understanding and acceptance.     Dental advisory given  Plan Discussed with: CRNA  Anesthesia Plan Comments:        Anesthesia Quick Evaluation

## 2022-08-15 NOTE — Discharge Instructions (Signed)
   [facility]  Discharge Instructions  AV Fistula or Graft Surgery for Dialysis Access  Please refer to the following instructions for your post-procedure care. Your surgeon or physician assistant will discuss any changes with you.  Activity  You may drive the day following your surgery, if you are comfortable and no longer taking prescription pain medication. Resume full activity as the soreness in your incision resolves.  Bathing/Showering  You may shower after you go home. Keep your incision dry for 48 hours. Do not soak in a bathtub, hot tub, or swim until the incision heals completely. You may not shower if you have a hemodialysis catheter.  Incision Care  Clean your incision with mild soap and water after 48 hours. Pat the area dry with a clean towel. You do not need a bandage unless otherwise instructed. Do not apply any ointments or creams to your incision. You may have skin glue on your incision. Do not peel it off. It will come off on its own in about one week. Your arm may swell a bit after surgery. To reduce swelling use pillows to elevate your arm so it is above your heart. Your doctor will tell you if you need to lightly wrap your arm with an ACE bandage.  Diet  Resume your normal diet. There are not special food restrictions following this procedure. In order to heal from your surgery, it is CRITICAL to get adequate nutrition. Your body requires vitamins, minerals, and protein. Vegetables are the best source of vitamins and minerals. Vegetables also provide the perfect balance of protein. Processed food has little nutritional value, so try to avoid this.  Medications  Resume taking all of your medications. If your incision is causing pain, you may take over-the counter pain relievers such as acetaminophen (Tylenol). If you were prescribed a stronger pain medication, please be aware these medications can cause nausea and constipation. Prevent  nausea by taking the medication with a snack or meal. Avoid constipation by drinking plenty of fluids and eating foods with high amount of fiber, such as fruits, vegetables, and grains.  Do not take Tylenol if you are taking prescription pain medications.  Follow up Your surgeon may want to see you in the office following your access surgery. If so, this will be arranged at the time of your surgery.  Please call us immediately for any of the following conditions:  Increased pain, redness, drainage (pus) from your incision site Fever of 101 degrees or higher Severe or worsening pain at your incision site Hand pain or numbness.  Reduce your risk of vascular disease:  Stop smoking. If you would like help, call [facility] at 1-800-QUIT-NOW (6121120078) or [place] at [phone]  Manage your cholesterol Maintain a desired weight Control your diabetes Keep your blood pressure down  Dialysis  It will take several weeks to several months for your new dialysis access to be ready for use. Your surgeon will determine when it is okay to use it. Your nephrologist will continue to direct your dialysis. You can continue to use your Permcath until your new access is ready for use.   08/15/2022 [name] [id number] October 08, 1960  Surgeon(s): Leonie Douglas, MD  Procedure(s): RIGHT ARM 1ST STAGE BRACHIOBASILIC ATERIOVENOUS FISTULA CREATION INSERTION OF TUNNELED DIALYSIS CATHETER USING PALINDROME CATHETER KIT 19CM  X Do not stick fistula for 12 weeks    If you have any questions, please call the office at [phone].

## 2022-08-15 NOTE — Progress Notes (Signed)
  Postoperative hemodialysis access     Date of Surgery:  08/14/2022 Surgeon: Lenell Antu  Subjective:  c/o pain at the incision  PHYSICAL EXAMINATION:  Vitals:   08/15/22 0250 08/15/22 0604  BP: (!) 151/83 (!) 156/77  Pulse: 87 88  Resp:  18  Temp:  98.3 F (36.8 C)  SpO2: 94% 93%    Incision is clean with steri strips in place Sensation in digits is intact;  There is  Thrill  There is bruit. The fistula is palpable  The right radial pulse is palpable   ASSESSMENT/PLAN:  [name] is a 62 y.o. year old female who is s/p right 1st stage BVT and TDC placement by Dr. Lenell Antu 08/14/2022.  - fistula is patent -pt does not have evidence of steal sx and has easily palpable right radial pulse -f/u with [facility] in 6 weeks to check maturation of AVF-our office will arrange follow up. -will sign off-call as needed.   Doreatha Massed, PA-C [facility] [phone]

## 2022-08-15 NOTE — TOC Progression Note (Signed)
Transition of Care [facility]) - Progression Note    Patient Details  Name: [name] MRN: [id number] Date of Birth: June 14, 1960  Transition of Care [facility]) CM/SW Contact  Leone Haven, RN Phone Number: 08/15/2022, 4:10 PM  Clinical Narrative:    NCM gave patient the [facility]  Part A to fill out. Will give to CSW tomorrow.   Expected Discharge Plan: Home/Self Care Barriers to Discharge: Inadequate or no insurance, Transportation  Expected Discharge Plan and Services In-house Referral: NA Discharge Planning Services: CM Consult Post Acute Care Choice: NA Living arrangements for the past 2 months: Hotel/Motel                 DME Arranged: N/A DME Agency: NA       HH Arranged: NA           Social Determinants of Health (SDOH) Interventions SDOH Screenings   Food Insecurity: No Food Insecurity (08/13/2022)  Housing: Patient Unable To Answer (08/13/2022)  Transportation Needs: No Transportation Needs (08/13/2022)  Recent Concern: Transportation Needs - Unmet Transportation Needs (08/02/2022)  Utilities: Not At Risk (08/13/2022)  Depression (PHQ2-9): Medium Risk (01/21/2022)  Tobacco Use: High Risk (08/15/2022)    Readmission Risk Interventions    08/13/2022    4:47 PM 08/02/2022    1:56 PM 11/16/2021   12:16 PM  Readmission Risk Prevention Plan  Transportation Screening Complete Complete Complete  PCP or Specialist Appt within 3-5 Days   Complete  HRI or Home Care Consult   Complete  Social Work Consult for Recovery Care Planning/Counseling   Complete  Palliative Care Screening   Not Applicable  Medication Review [facility]) Complete Referral to Pharmacy Complete  PCP or Specialist appointment within 3-5 days of discharge Complete Complete   HRI or Home Care Consult Complete Complete   SW Recovery Care/Counseling Consult  Complete   Palliative Care Screening Not Applicable Not Applicable   Skilled Nursing Facility Not Applicable Not Applicable

## 2022-08-15 NOTE — Progress Notes (Addendum)
Progress Note  Primary GI: Dr. Myrtie Neither  LOS: 2 days   Chief Complaint:acute on chronic anemia   Subjective   Patient reports post procedure pain where her AV fistula was placed. Denies abdominal pain, nausea, and vomiting. Unsure if she has had a bowel movement. States she is okay with proceeding with procedures.   Objective   Vital signs in last 24 hours: Temp:  [97.2 F (36.2 C)-99.5 F (37.5 C)] 99.5 F (37.5 C) (05/30 1111) Pulse Rate:  [78-99] 79 (05/30 1111) Resp:  [11-21] 13 (05/30 1111) BP: (134-174)/(67-102) 156/70 (05/30 1111) SpO2:  [90 %-100 %] 91 % (05/30 1111) Weight:  [64.7 kg-70.3 kg] 70.3 kg (05/30 0604) Last BM Date : 08/14/22 Last BM recorded by nurses in past 5 days Stool Type: Type 3 (Sausage shape with surface cracks) (08/14/2022  6:00 AM)  General:   female in no acute distress  Heart:  Regular rate and rhythm; no murmurs Pulm: Clear anteriorly; no wheezing Abdomen: soft, nondistended, normal bowel sounds in all quadrants. Nontender without guarding. No organomegaly appreciated. Extremities:  No edema Neurologic:  Alert and  oriented x4;  No focal deficits.  Psych:  Cooperative. Normal mood and affect.  Intake/Output from previous day: 05/29 0701 - 05/30 0700 In: 820 [P.O.:320; I.V.:500] Out: 2700 [Urine:1700] Intake/Output this shift: Total I/O In: 240 [P.O.:240] Out: -   Studies/Results: DG Chest Port 1 View  Result Date: 08/15/2022 CLINICAL DATA:  Hypoxia.  Cough and fever. EXAM: PORTABLE CHEST 1 VIEW COMPARISON:  Aug 14, 2022. FINDINGS: Diffuse interstitial and patchy airspace opacities. No visible pneumothorax. Hazy bibasilar opacities likely are in part due to layering pleural effusions. Enlarged cardiac silhouette. Pulmonary vascular congestion. Right IJ approach central venous catheter with the tip projecting at the superior right atrium. IMPRESSION: 1. Cardiomegaly, pulmonary vascular congestion diffuse interstitial airspace opacities,  probable layering pleural effusions, suspicious for pulmonary edema. Findings appear mildly worsened compared to May 29, 24. 2. Superimposed infection is not excluded. Electronically Signed   By: Feliberto Harts M.D.   On: 08/15/2022 11:20   DG CHEST PORT 1 VIEW  Result Date: 08/14/2022 CLINICAL DATA:  Status post dialysis catheter insertion. EXAM: PORTABLE CHEST 1 VIEW COMPARISON:  08/12/2022 FINDINGS: Right jugular dialysis catheter has been placed. The catheter tip is in the upper SVC region. Negative for pneumothorax. Patchy airspace densities in both lungs are concerning for pulmonary edema. Increased densities in the retrocardiac space could represent pleural fluid or atelectasis. Heart size is mildly enlarged. IMPRESSION: 1. Placement of right jugular dialysis catheter with the catheter tip in the SVC region. Negative for pneumothorax. 2. Patchy airspace densities in both lungs are concerning for pulmonary edema. 3. Increased densities in the retrocardiac space could represent pleural fluid or atelectasis. Electronically Signed   By: Richarda Overlie M.D.   On: 08/14/2022 13:39   DG C-Arm 1-60 Min-No Report  Result Date: 08/14/2022 Fluoroscopy was utilized by the requesting physician.  No radiographic interpretation.    Lab Results: Recent Labs    08/13/22 0039 08/13/22 1546 08/14/22 0139 08/15/22 0104  WBC 6.5  --  8.1 6.9  HGB 6.6* 7.4* 7.4* 7.3*  HCT 20.4* 23.0* 23.1* 23.4*  PLT 363  --  378 329   BMET Recent Labs    08/13/22 0039 08/14/22 0139 08/15/22 0104  NA 132* 136 134*  K 4.0 4.5 3.9  CL 102 106 100  CO2 15* 17* 23  GLUCOSE 149* 104* 92  BUN 60* 64*  35*  CREATININE 4.78* 4.56* 3.16*  CALCIUM 7.6* 7.7* 7.8*   LFT Recent Labs    08/13/22 0039 08/15/22 0104  PROT 6.7  --   ALBUMIN 2.1* 2.0*  AST 16  --   ALT 10  --   ALKPHOS 73  --   BILITOT 0.4  --    PT/INR Recent Labs    08/12/22 2140  LABPROT 14.1  INR 1.1     Scheduled Meds:  busPIRone  10 mg  Oral BID   calcium acetate  667 mg Oral TID WC   Chlorhexidine Gluconate Cloth  6 each Topical Q0600   [START ON 08/16/2022] Chlorhexidine Gluconate Cloth  6 each Topical Q0600   darbepoetin (ARANESP) injection - NON-DIALYSIS  200 mcg Subcutaneous Q Tue-1800   ferrous sulfate  325 mg Oral Q breakfast   fluticasone furoate-vilanterol  1 puff Inhalation Daily   And   umeclidinium bromide  1 puff Inhalation Daily   furosemide  80 mg Intravenous BID   gabapentin  100 mg Oral TID   liver oil-zinc oxide   Topical BID   methylPREDNISolone (SOLU-MEDROL) injection  80 mg Intravenous Q24H   pantoprazole  40 mg Oral Daily   sodium bicarbonate  1,300 mg Oral TID   sodium chloride flush  3 mL Intravenous Q12H   Continuous Infusions:  ferric gluconate (FERRLECIT) IVPB 250 mg (08/15/22 0856)      Patient profile:   62 y.o. female with past medical history significant for  anemia (baseline 7-8), PAD, GERD, gastritis, hypertension, hyperlipidemia, CKD, COPD, hepatitis C, substance use, depression, anxiety, diastolic CHF presents for evaluation of anemia.   Progressive CKD leading to starting HD. S/p [facility] 5/29.   Impression:   Acute on chronic anemia, heme positive stool - hgb 7.3, stable, s/p 1 unit PRBCs on admission. - (baseline 7-8 since 2017 with multiple EGDs) -Iron 21, TIBC 280, saturation 8% -Ferritin 160 -Folate 8.5 -Vitamin B12 507 Acute on chronic anemia with presence of heme positive stool. Hgb is baseline for patient. Iron studies are okay. Suspect anemia is multi-factorial in the setting of progressive CKD leading to ESRD and HD moreso than iron deficiency. However, cannot r/o UGI with her history of ulcers. Anemia likely contributing to dyspnea on admission, though suspect main etiology is CHF exacerbation with fluid overload in addition to acute on chronic anemia.    ESRD starting HD - s/p [facility] 5/29 -BUN 35, creatinine 3.16, GFR 16, improved s/p dialysis   Acute hypoxic  respiratory failure -multifactorial in setting of acute on chronic diastolic CHF   History of Hep C - was referred to outpatient ID for management, lost to follow up. - LFTs normal   Plan:   - plan for small bowel enteroscopy with possible VCE tomorrow - I thoroughly discussed the procedure with the patient (at bedside) to include nature of the procedure, alternatives, benefits, and risks (including but not limited to bleeding, infection, perforation, anesthesia/cardiac pulmonary complications).  Patient verbalized understanding and gave verbal consent to proceed with procedure - Continue daily CBC and transfuse as needed to maintain HGB > 7  - protonix 40mg  IV BID   Nico Syme M Franciszek Platten  08/15/2022, 11:47 AM

## 2022-08-15 NOTE — Consult Note (Signed)
Palliative Care Consult Note                                  Date: 08/15/2022   Patient Name: [name]  DOB: 02-01-1961  MRN: [id number]  Age / Sex: 62 y.o., female  PCP: [name], MD Referring Physician: Lanae Boast, MD  Reason for Consultation: Establishing goals of care  HPI/Patient Profile: 62 y.o. female  with complex medical history of PAD, CKD stage V, COPD, hepatitis C, GERD, gastritis, anemia, HTN, HLD, and recent admission 08/01/2022 through 08/05/2022 for symptomatic anemia due to chronic GI bleeding.  She presented back to the ED on 08/12/2022 with shortness of breath and was admitted with acute hypoxic respiratory failure in the setting of acute on chronic CHF.  She was also found to have progressive CKD stage V and HD was initiated on 5/29.  Palliative Medicine has been consulted for goals of care discussions.   Subjective:   I have reviewed medical records including progress notes, labs and imaging, received an update from the RN, and met with patient at bedside to discuss diagnosis, prognosis, and GOC.  I introduced Palliative Medicine as specialized medical care for people living with serious illness. It focuses on providing relief from the symptoms and stress of a serious illness.   Patient expresses much frustration regarding her current medical situation. She tells me she is in the hospital due to having a lot of fluid and is now on dialysis.    Patient is very eager to return home, and shares that she has lived in a hotel since October 2023.  She shares that she lives alone.  I inquired about [name], who is listed as a contact.  Patient reports that [name] is not her significant other, but is a good friend.  I attempted to discuss code status. Patient does state that she would not want resuscitation efforts (CPR, defibrillation, intubation) in the event of cardiac arrest. She also tells me she at peace with dying  (when that time comes) because she has a husband and son "waiting for her".   Patient is overall very frustrated, angry, and uncooperative during my visit. She in unable/unwilling to engage in a meaningful conversation about her overall goals of care.  She asks several times "why are you asking these questions". She also perseverates on wanting to go home and not liking the hospital food at all. She ultimately states "talk to my daughters", and confirms she would want them to make medical decisions for her if she is unable to make those decisions for herself. ___________________________________________________________________________  17:30 - I later met with patient's daughters [name] and [name] in the [facility] conference room.  They share that they have been estranged from their mother for several years due to her "lifestyle".  They share that their mother has a [place] history of polysubstance abuse and lost custody of her 3 children (both daughters as well as her son who is now deceased).   We discussed patient's current illness and what it means in the larger context of her ongoing co-morbidities. Current clinical status was reviewed. We reviewed her issues of CKD now progressed to ESRD requiring dialysis, heart failure, COPD, and anemia with concern for GI bleeding. Daughters verbalize understanding that patient is seriously ill with a very guarded prognosis.  Created space and opportunity for daughters to express thoughts and feelings regarding patient's current medical situation.  Values and goals of care were attempted to be elicited.  Daughters share that their mother had previously stated she "would not want to be hooked up to a machine".  They state that patient has expressed she is very angry about starting dialysis. They also feel that patient's quality of life was not good even prior to admission, and express concern that it will be worse now.   The difference between full scope medical intervention  and comfort care was considered. We discussed that comfort care would mean stopping dialysis and allowing a natural course to occur. Discussed that patient's  prognosis upon stopping dialysis would likely be 7-14 days. Discussed that she would be given medication to relieve pain, anxiety, and/or dyspnea. Daughters plan to discuss the option of comfort care with patient over the next few days.  I offered to be present for support, but they decline and think patient will be more receptive to the conversation with them alone.   Discussed the importance of continued conversation with patient, family, and the medical team regarding overall plan of care and treatment options.    Review of Systems  Constitutional:  Positive for fatigue.    Objective:   Primary Diagnoses: Present on Admission:  Polysubstance abuse (HCC)  PUD (peptic ulcer disease)  IDA (iron deficiency anemia)  Hypertension  HLD (hyperlipidemia)  Gastroesophageal reflux disease  Gastritis and gastroduodenitis  CKD (chronic kidney disease) stage 5, GFR less than 15 ml/min (HCC)  Chronic leg pain  Chronic hepatitis C without hepatic coma (HCC)  Anxiety and depression  Acute on chronic diastolic CHF (congestive heart failure) (HCC)  Chronic obstructive pulmonary disease (HCC)   Physical Exam Vitals reviewed.  Constitutional:      General: She is not in acute distress.    Appearance: She is ill-appearing.  Pulmonary:     Effort: No respiratory distress.  Neurological:     Mental Status: She is alert and oriented to person, place, and time.  Psychiatric:        Mood and Affect: Affect is angry.        Behavior: Behavior is uncooperative and agitated.     Vital Signs:  BP (!) 156/70 (BP Location: Left Arm)   Pulse 90   Temp 99.5 F (37.5 C) (Oral)   Resp 13   Ht 5\' 7"  (1.702 m)   Wt 70.3 kg   SpO2 92%   BMI 24.27 kg/m   Palliative Assessment/Data: PPS 40%     Assessment & Plan:   SUMMARY OF  RECOMMENDATIONS   Will change code status to DNR/DNI after updating care team tomorrow morning  Continue current interventions Ongoing palliative support  Primary Decision Maker: PATIENT can make decisions, but due to her behavioral issues I recommend daughters should be involved in any major medical decisions  Symptom Management:  Per primary team: - Oxycodone-acetaminophen 5-325 mg every 8 hours as needed for pain - Trazodone 50 mg at bedtime as needed for sleep  Prognosis:  Very guarded  Discharge Planning:  Patient wants to go home   Thank you for allowing [place] to participate in the care of [facility]   Time Total: 120 minutes  Greater than 50%  of this time was spent counseling and coordinating care related to the above assessment and plan.  Signed by: Sherlean Foot, NP Palliative Medicine Team  Team Phone # [phone]  For individual providers, please see AMION

## 2022-08-15 NOTE — H&P (View-Only) (Signed)
  Progress Note  Primary GI: Dr. Danis  LOS: 2 days   Chief Complaint:acute on chronic anemia   Subjective   Patient reports post procedure pain where her AV fistula was placed. Denies abdominal pain, nausea, and vomiting. Unsure if she has had a bowel movement. States she is okay with proceeding with procedures.   Objective   Vital signs in last 24 hours: Temp:  [97.2 F (36.2 C)-99.5 F (37.5 C)] 99.5 F (37.5 C) (05/30 1111) Pulse Rate:  [78-99] 79 (05/30 1111) Resp:  [11-21] 13 (05/30 1111) BP: (134-174)/(67-102) 156/70 (05/30 1111) SpO2:  [90 %-100 %] 91 % (05/30 1111) Weight:  [64.7 kg-70.3 kg] 70.3 kg (05/30 0604) Last BM Date : 08/14/22 Last BM recorded by nurses in past 5 days Stool Type: Type 3 (Sausage shape with surface cracks) (08/14/2022  6:00 AM)  General:   female in no acute distress  Heart:  Regular rate and rhythm; no murmurs Pulm: Clear anteriorly; no wheezing Abdomen: soft, nondistended, normal bowel sounds in all quadrants. Nontender without guarding. No organomegaly appreciated. Extremities:  No edema Neurologic:  Alert and  oriented x4;  No focal deficits.  Psych:  Cooperative. Normal mood and affect.  Intake/Output from previous day: 05/29 0701 - 05/30 0700 In: 820 [P.O.:320; I.V.:500] Out: 2700 [Urine:1700] Intake/Output this shift: Total I/O In: 240 [P.O.:240] Out: -   Studies/Results: DG Chest Port 1 View  Result Date: 08/15/2022 CLINICAL DATA:  Hypoxia.  Cough and fever. EXAM: PORTABLE CHEST 1 VIEW COMPARISON:  Aug 14, 2022. FINDINGS: Diffuse interstitial and patchy airspace opacities. No visible pneumothorax. Hazy bibasilar opacities likely are in part due to layering pleural effusions. Enlarged cardiac silhouette. Pulmonary vascular congestion. Right IJ approach central venous catheter with the tip projecting at the superior right atrium. IMPRESSION: 1. Cardiomegaly, pulmonary vascular congestion diffuse interstitial airspace opacities,  probable layering pleural effusions, suspicious for pulmonary edema. Findings appear mildly worsened compared to May 29, 24. 2. Superimposed infection is not excluded. Electronically Signed   By: Frederick S Jones M.D.   On: 08/15/2022 11:20   DG CHEST PORT 1 VIEW  Result Date: 08/14/2022 CLINICAL DATA:  Status post dialysis catheter insertion. EXAM: PORTABLE CHEST 1 VIEW COMPARISON:  08/12/2022 FINDINGS: Right jugular dialysis catheter has been placed. The catheter tip is in the upper SVC region. Negative for pneumothorax. Patchy airspace densities in both lungs are concerning for pulmonary edema. Increased densities in the retrocardiac space could represent pleural fluid or atelectasis. Heart size is mildly enlarged. IMPRESSION: 1. Placement of right jugular dialysis catheter with the catheter tip in the SVC region. Negative for pneumothorax. 2. Patchy airspace densities in both lungs are concerning for pulmonary edema. 3. Increased densities in the retrocardiac space could represent pleural fluid or atelectasis. Electronically Signed   By: Adam  Henn M.D.   On: 08/14/2022 13:39   DG C-Arm 1-60 Min-No Report  Result Date: 08/14/2022 Fluoroscopy was utilized by the requesting physician.  No radiographic interpretation.    Lab Results: Recent Labs    08/13/22 0039 08/13/22 1546 08/14/22 0139 08/15/22 0104  WBC 6.5  --  8.1 6.9  HGB 6.6* 7.4* 7.4* 7.3*  HCT 20.4* 23.0* 23.1* 23.4*  PLT 363  --  378 329   BMET Recent Labs    08/13/22 0039 08/14/22 0139 08/15/22 0104  NA 132* 136 134*  K 4.0 4.5 3.9  CL 102 106 100  CO2 15* 17* 23  GLUCOSE 149* 104* 92  BUN 60* 64*   35*  CREATININE 4.78* 4.56* 3.16*  CALCIUM 7.6* 7.7* 7.8*   LFT Recent Labs    08/13/22 0039 08/15/22 0104  PROT 6.7  --   ALBUMIN 2.1* 2.0*  AST 16  --   ALT 10  --   ALKPHOS 73  --   BILITOT 0.4  --    PT/INR Recent Labs    08/12/22 2140  LABPROT 14.1  INR 1.1     Scheduled Meds:  busPIRone  10 mg  Oral BID   calcium acetate  667 mg Oral TID WC   Chlorhexidine Gluconate Cloth  6 each Topical Q0600   [START ON 08/16/2022] Chlorhexidine Gluconate Cloth  6 each Topical Q0600   darbepoetin (ARANESP) injection - NON-DIALYSIS  200 mcg Subcutaneous Q Tue-1800   ferrous sulfate  325 mg Oral Q breakfast   fluticasone furoate-vilanterol  1 puff Inhalation Daily   And   umeclidinium bromide  1 puff Inhalation Daily   furosemide  80 mg Intravenous BID   gabapentin  100 mg Oral TID   liver oil-zinc oxide   Topical BID   methylPREDNISolone (SOLU-MEDROL) injection  80 mg Intravenous Q24H   pantoprazole  40 mg Oral Daily   sodium bicarbonate  1,300 mg Oral TID   sodium chloride flush  3 mL Intravenous Q12H   Continuous Infusions:  ferric gluconate (FERRLECIT) IVPB 250 mg (08/15/22 0856)      Patient profile:   61 y.o. female with past medical history significant for  anemia (baseline 7-8), PAD, GERD, gastritis, hypertension, hyperlipidemia, CKD, COPD, hepatitis C, substance use, depression, anxiety, diastolic CHF presents for evaluation of anemia.   Progressive CKD leading to starting HD. S/p TDC 5/29.   Impression:   Acute on chronic anemia, heme positive stool - hgb 7.3, stable, s/p 1 unit PRBCs on admission. - (baseline 7-8 since 2017 with multiple EGDs) -Iron 21, TIBC 280, saturation 8% -Ferritin 160 -Folate 8.5 -Vitamin B12 507 Acute on chronic anemia with presence of heme positive stool. Hgb is baseline for patient. Iron studies are okay. Suspect anemia is multi-factorial in the setting of progressive CKD leading to ESRD and HD moreso than iron deficiency. However, cannot r/o UGI with her history of ulcers. Anemia likely contributing to dyspnea on admission, though suspect main etiology is CHF exacerbation with fluid overload in addition to acute on chronic anemia.    ESRD starting HD - s/p TDC 5/29 -BUN 35, creatinine 3.16, GFR 16, improved s/p dialysis   Acute hypoxic  respiratory failure -multifactorial in setting of acute on chronic diastolic CHF   History of Hep C - was referred to outpatient ID for management, lost to follow up. - LFTs normal   Plan:   - plan for small bowel enteroscopy with possible VCE tomorrow - I thoroughly discussed the procedure with the patient (at bedside) to include nature of the procedure, alternatives, benefits, and risks (including but not limited to bleeding, infection, perforation, anesthesia/cardiac pulmonary complications).  Patient verbalized understanding and gave verbal consent to proceed with procedure - Continue daily CBC and transfuse as needed to maintain HGB > 7  - protonix 40mg IV BID   Breaker Springer M Demonta Wombles  08/15/2022, 11:47 AM    

## 2022-08-15 NOTE — Progress Notes (Signed)
Met with pt at bedside this morning. Introduced self and explained role. Pt was fixated on breakfast so only able to get basic information from pt in order to submit referral. Pt reports that she lives at [facility] in [place] ([address]) and plans to return there at d/c. Pt prefers [facility] for out-pt HD if possible. Referral submitted to [facility] admissions this morning for review. Pt has medicare part A only per hospital records and for this reason pt will require financial clearance for out-pt HD. Pt advised she will be receiving a call from [facility] with some financial questions and pt will need to answer the call and provide needed info. Pt voices understanding. Contacted RN CM and CSW to be advised of pt's need for out-pt HD at d/c and referral being made today. Also advised staff that unsure how pt plans to get to out-pt HD due to being unable to have an extensive conversation with pt this morning due to pt's mood/demeanor. Will assist as needed.   Olivia Canter Renal Navigator [phone]

## 2022-08-15 NOTE — Progress Notes (Signed)
Subjective:  s/p AVF and TDC yesterday followed by first HD-  she was a behavioral problem in HD but tolerated hemodynamically-  also made 1700 of urine in addition to 1000 removed with HD.  She is under the influence of narcotics at present-  says she does not remember swearing at the HD staff yesterday   Objective Vital signs in last 24 hours: Vitals:   08/14/22 2304 08/15/22 0250 08/15/22 0604 08/15/22 0752  BP: (!) 134/102 (!) 151/83 (!) 156/77   Pulse: 92 87 88   Resp: 16  18   Temp: 98.3 F (36.8 C)  98.3 F (36.8 C)   TempSrc: Oral  Oral   SpO2: 93% 94% 93% 90%  Weight:   70.3 kg   Height:       Weight change: 1.3 kg  Intake/Output Summary (Last 24 hours) at 08/15/2022 2956 Last data filed at 08/15/2022 0300 Gross per 24 hour  Intake 820 ml  Output 2700 ml  Net -1880 ml    Assessment/Plan: 62 year old WF with history of substance abuse, HTN, hep C and poor social situation with progressive CKD-  2 hosps in the past 2 weeks , really failure to thrive 1.Renal- progressive CKD really unknown etiology but also incomplete CKD care due to social situation-  progressing to ESRD.   2 hosps in the past 2 weeks-  cannot maintain her hgb or volume status with albumin of 2.1 and many physical sxms.  I told her that I thought it would be in her best interest to start dialysis " I am ready"  Appreciate VVS to place AVG and [facility] 5/29 followed by HD -  plan for second HD 5/31.  It has been a bit of a rocky start-  Renal navigator on board to arrange OP HD 2. Hypertension/volume  - overloaded-  is making some urine-  will continue diuretics and UF as able with HD  3. Metabolic acidosis-  due to CKD- on bicarb-  dialysis will correct  4. Anemia  - severe and recurrent-  CKD is a factor but may be GI bleeding as well -   giving esa and iron-  low but stable 5. Bones-  calc low, phos high-  will check PTH  ( pending) added phoslo       [name]    Labs: Basic Metabolic  Panel: Recent Labs  Lab 08/13/22 0039 08/14/22 0139 08/15/22 0104  NA 132* 136 134*  K 4.0 4.5 3.9  CL 102 106 100  CO2 15* 17* 23  GLUCOSE 149* 104* 92  BUN 60* 64* 35*  CREATININE 4.78* 4.56* 3.16*  CALCIUM 7.6* 7.7* 7.8*  PHOS  --   --  4.2   Liver Function Tests: Recent Labs  Lab 08/12/22 1708 08/13/22 0039 08/15/22 0104  AST 16 16  --   ALT 11 10  --   ALKPHOS 77 73  --   BILITOT 0.6 0.4  --   PROT 7.1 6.7  --   ALBUMIN 2.2* 2.1* 2.0*   No results for input(s): "LIPASE", "AMYLASE" in the last 168 hours. No results for input(s): "AMMONIA" in the last 168 hours. CBC: Recent Labs  Lab 08/12/22 1708 08/13/22 0039 08/13/22 1546 08/14/22 0139 08/15/22 0104  WBC 7.3 6.5  --  8.1 6.9  NEUTROABS 5.0  --   --   --   --   HGB 7.0* 6.6* 7.4* 7.4* 7.3*  HCT 22.0* 20.4* 23.0* 23.1* 23.4*  MCV 97.3 92.3  --  90.2 91.4  PLT 371 363  --  378 329   Cardiac Enzymes: No results for input(s): "CKTOTAL", "CKMB", "CKMBINDEX", "TROPONINI" in the last 168 hours. CBG: Recent Labs  Lab 08/14/22 1250  GLUCAP 92    Iron Studies:  Recent Labs    08/13/22 0952  IRON 21*  TIBC 280  FERRITIN 160   Studies/Results: DG CHEST PORT 1 VIEW  Result Date: 08/14/2022 CLINICAL DATA:  Status post dialysis catheter insertion. EXAM: PORTABLE CHEST 1 VIEW COMPARISON:  08/12/2022 FINDINGS: Right jugular dialysis catheter has been placed. The catheter tip is in the upper SVC region. Negative for pneumothorax. Patchy airspace densities in both lungs are concerning for pulmonary edema. Increased densities in the retrocardiac space could represent pleural fluid or atelectasis. Heart size is mildly enlarged. IMPRESSION: 1. Placement of right jugular dialysis catheter with the catheter tip in the SVC region. Negative for pneumothorax. 2. Patchy airspace densities in both lungs are concerning for pulmonary edema. 3. Increased densities in the retrocardiac space could represent pleural fluid or  atelectasis. Electronically Signed   By: Richarda Overlie M.D.   On: 08/14/2022 13:39   DG C-Arm 1-60 Min-No Report  Result Date: 08/14/2022 Fluoroscopy was utilized by the requesting physician.  No radiographic interpretation.   ECHOCARDIOGRAM COMPLETE  Result Date: 08/13/2022    ECHOCARDIOGRAM REPORT   Patient Name:   [name] Date of Exam: 08/13/2022 Medical Rec #:  [id number]      Height:       67.0 in Accession #:    [id number]     Weight:       139.5 lb Date of Birth:  08/04/1960      BSA:          1.735 m Patient Age:    62 years       BP:           159/93 mmHg Patient Gender: F              HR:           87 bpm. 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 CHF, COPD, Signs/Symptoms:Shortness of Breath                 and Dyspnea; Risk Factors:Hypertension and Current Smoker. IVDU.                 ETOH.  Sonographer:    Sheralyn Boatman RDCS Referring Phys: [id number] [name] MELVIN IMPRESSIONS  1. Left ventricular ejection fraction, by estimation, is 55 to 60%. The left ventricle has normal function. The left ventricle has no regional wall motion abnormalities. Left ventricular diastolic parameters are consistent with Grade I diastolic dysfunction (impaired relaxation).  2. Right ventricular systolic function is normal. The right ventricular size is normal. There is mildly elevated pulmonary artery systolic pressure.  3. The mitral valve is degenerative. Mild mitral valve regurgitation.  4. The aortic valve is tricuspid. Aortic valve regurgitation is not visualized. Mild aortic valve stenosis. Aortic valve mean gradient measures 12.5 mmHg. Aortic  valve Vmax measures 2.37 m/s.  5. The inferior vena cava is dilated in size with <50%  respiratory variability, suggesting right atrial pressure of 15 mmHg. Comparison(s): No significant change from prior study. FINDINGS  Left Ventricle: Left ventricular ejection fraction, by estimation, is 55 to 60%. The left ventricle has normal function. The left ventricle has no regional wall motion abnormalities. The left ventricular internal cavity size was normal in size. There is  no left ventricular hypertrophy. Left ventricular diastolic parameters are consistent with Grade I diastolic dysfunction (impaired relaxation). Right Ventricle: The right ventricular size is normal. No increase in right ventricular wall thickness. Right ventricular systolic function is normal. There is mildly elevated pulmonary artery systolic pressure. The tricuspid regurgitant velocity is 2.65  m/s, and with an assumed right atrial pressure of 15 mmHg, the estimated right ventricular systolic pressure is 43.1 mmHg. Left Atrium: Left atrial size was normal in size. Right Atrium: Right atrial size was normal in size. Pericardium: There is no evidence of pericardial effusion. Mitral Valve: The mitral valve is degenerative in appearance. Mild mitral valve regurgitation. MV peak gradient, 10.0 mmHg. The mean mitral valve gradient is 5.0 mmHg. Tricuspid Valve: The tricuspid valve is grossly normal. Tricuspid valve regurgitation is trivial. No evidence of tricuspid stenosis. Aortic Valve: The aortic valve is tricuspid. Aortic valve regurgitation is not visualized. Mild aortic stenosis is present. Aortic valve mean gradient measures 12.5 mmHg. Aortic valve peak gradient measures 22.5 mmHg. Aortic valve area, by VTI measures 1.40 cm. Pulmonic Valve: The pulmonic valve was grossly normal. Pulmonic valve regurgitation is not visualized. No evidence of pulmonic stenosis. Aorta: The aortic root and ascending aorta are structurally normal, with no evidence of dilitation. Venous: The inferior vena cava is dilated in size with less than 50%  respiratory variability, suggesting right atrial pressure of 15 mmHg. IAS/Shunts: The atrial septum is grossly normal.  LEFT VENTRICLE PLAX 2D LVIDd:         5.00 cm      Diastology LVIDs:         3.70 cm      LV e' medial:    6.96 cm/s LV PW:         1.20 cm      LV E/e' medial:  20.8 LV IVS:        1.00 cm      LV e' lateral:   6.90 cm/s LVOT diam:     1.80 cm      LV E/e' lateral: 21.0 LV SV:         58 LV SV Index:   34 LVOT Area:     2.54 cm  LV Volumes (MOD) LV vol d, MOD A2C: 127.5 ml LV vol d, MOD A4C: 104.0 ml LV vol s, MOD A2C: 59.0 ml LV vol s, MOD A4C: 45.1 ml LV SV MOD A2C:     68.5 ml LV SV MOD A4C:     104.0 ml LV SV MOD BP:      64.2 ml RIGHT VENTRICLE             IVC RV S prime:     12.50 cm/s  IVC diam: 2.30 cm TAPSE (M-mode): 2.5 cm LEFT ATRIUM             Index        RIGHT ATRIUM           Index LA diam:        3.90 cm 2.25 cm/m  RA Area:     13.20 cm LA Vol (A2C):   35.3 ml 20.34 ml/m  RA Volume:   33.60 ml  19.36 ml/m LA Vol (A4C):   25.0 ml 14.41 ml/m LA Biplane Vol: 32.2 ml 18.56 ml/m  AORTIC VALVE AV Area (Vmax):    1.42 cm AV Area (Vmean):   1.36 cm AV Area (VTI):     1.40 cm AV Vmax:           237.00 cm/s AV Vmean:          163.500 cm/s AV VTI:            0.416 m AV Peak Grad:      22.5 mmHg AV Mean Grad:      12.5 mmHg LVOT Vmax:         132.00 cm/s LVOT Vmean:        87.300 cm/s LVOT VTI:          0.229 m LVOT/AV VTI ratio: 0.55  AORTA Ao Root diam: 3.20 cm Ao Asc diam:  3.10 cm MITRAL VALVE                  TRICUSPID VALVE MV Area (PHT): 3.85 cm       TR Peak grad:   28.1 mmHg MV Area VTI:   1.39 cm       TR Vmax:        265.00 cm/s MV Peak grad:  10.0 mmHg MV Mean grad:  5.0 mmHg       SHUNTS MV Vmax:       1.58 m/s       Systemic VTI:  0.23 m MV Vmean:      107.0 cm/s     Systemic Diam: 1.80 cm MV Decel Time: 197 msec MR Peak grad:    91.4 mmHg MR Mean grad:    73.0 mmHg MR Vmax:         478.00 cm/s MR Vmean:        417.0 cm/s MR PISA:         2.26 cm MR PISA Eff ROA:  18 mm MR PISA Radius:  0.60 cm MV E velocity: 145.00 cm/s MV A velocity: 136.00 cm/s MV E/A ratio:  1.07 Lennie Odor MD Electronically signed by Lennie Odor MD Signature Date/Time: 08/13/2022/12:42:37 PM    Final    Medications: Infusions:  ferric gluconate (FERRLECIT) IVPB 250 mg (08/14/22 1630)    Scheduled Medications:  busPIRone  10 mg Oral BID   calcium acetate  667 mg Oral TID WC   Chlorhexidine Gluconate Cloth  6 each Topical Q0600   darbepoetin (ARANESP) injection - NON-DIALYSIS  200 mcg Subcutaneous Q Tue-1800   ferrous sulfate  325 mg Oral Q breakfast   fluticasone furoate-vilanterol  1 puff Inhalation Daily   And   umeclidinium bromide  1 puff Inhalation Daily   furosemide  80 mg Intravenous BID   gabapentin  100 mg Oral TID   liver oil-zinc oxide   Topical BID   pantoprazole  40 mg Oral Daily   sodium bicarbonate  1,300 mg Oral TID   sodium chloride flush  3 mL Intravenous Q12H    have reviewed scheduled and prn medications.  Physical Exam: General: coughing-  eyes closed-  under the influence of narcotics this AM Heart: RRR Lungs: CBS and dec air movement  Abdomen: soft, non tender  Extremities: pitting edema  Dialysis Access:  [facility] and also right AVF-  can hear bruit    08/15/2022,8:06 AM  LOS: 2 days

## 2022-08-15 NOTE — Progress Notes (Addendum)
Sputum cup left in patients room to obtain sputum sample.   08/15/22 1316  Therapy Vitals  Pulse Rate 90  Resp 13  MEWS Score/Color  MEWS Score 1  MEWS Score Color Green  Respiratory Assessment  Assessment Type Assess only  Respiratory Pattern Regular;Labored;Dyspnea at rest  Chest Assessment Chest expansion symmetrical  Bilateral Breath Sounds Diminished  Oxygen Therapy/Pulse Ox  O2 Device HFNC  O2 Therapy Oxygen humidified  O2 Flow Rate (L/min) (S)  10 L/min (Patient placed on 10L at this time due to desat to 86% while talking with palliative, RT to continue to monitor.)  SpO2 92 %

## 2022-08-15 NOTE — Consult Note (Addendum)
NAMEDELAYSIA PAUP, MRN:  [id number], DOB:  [birth year], LOS: 2 ADMISSION DATE:  08/12/2022, CONSULTATION DATE:  08/15/22 REFERRING MD:  Dayna Barker, CHIEF COMPLAINT:  hypoxia, SOB   History of Present Illness:  62 y.o. F with PMH significant for CKD stage IV, HFpEF, polysubstance abuse, tobacco use and COPD not on CPAP or home O2 who presented to the ED 5/28 with complaints of shortness of breath and LE swelling.  She had recently been admitted 5/16-5/20 for chronic GIB and iron deficiency anemia and discharged back to motel where she is living.  She required HFNC and CXR showed pulmonary edema.  She has had some productive cough and is still smoking (since age 16) about 0.5ppd. BNP was elevated to 1084.   Pt was admitted to [facility] and treated with lasix, steroids and nebulizers as she was wheezing on exam.   Nephrology was consulted and noted overall failure to thrive and CKD now progressing to ESRD and R IJ tunneled dialysis catheter was placed with creation of RUE AV fistula creation on 5/29.   First HD session the same with 1L removed, she also had 1700cc UOP.  On 5/30 she remained on HFNC 10L with wheezing, therefore PCCM consulted for further recommendations for hypoxia.  Pt up eating and in no distress, she says her breathing is "fine" and would like not to be bothered.   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   Significant Hospital Events: Including procedures, antibiotic start and stop dates in addition to other pertinent events   5/28 admitted to [facility] 5/29  started HD 5/30 remains on HFNC, PCCM consult  Interim History / Subjective:  Pt eating lunch, says her breathing is neither improved or worse since arrival   Objective   Blood pressure (!) 156/70, pulse 79, temperature 99.5 F (37.5 C), temperature source Oral, resp. rate 13, height 5\' 7"  (1.702 m), weight 70.3 kg, SpO2 91 %.        Intake/Output Summary (Last 24 hours) at 08/15/2022 1214 Last data filed at 08/15/2022 1100 Gross per 24 hour  Intake 1060 ml  Output 2700 ml  Net -1640 ml   Filed Weights   08/14/22 1519 08/14/22 1815 08/15/22 0604  Weight: 65.8 kg 64.7 kg 70.3 kg    General:  chronically and acutely ill-appearing F in no acute distress, irritable  HEENT: MM pink/moist, sclera anicteric Neuro: alert and oriented, moving all extremities CV: s1s2 rrr, no m/r/g PULM:  examined on HFNC, no tachypnea or accessory muscle use, bilateral basilar expiratory wheezing without rhonchi GI: soft, bsx4 active  Extremities: warm/dry, 1+ edema  Skin: no rashes or lesions  Labs: WBC 6.9 Creatinine 3.16  Resolved Hospital Problem list     Assessment & Plan:    Acute Hypoxic Respiratory Failure in the setting of volume overload from ESRD, HFpEF and baseline COPD and tobacco use She does not follow with pulmonology, wear CPAP or home O2 Last EF preserved with diastolic HF CXR consistent with pulmonary edema and diffuse interstitial opacities, no leukocytosis, temp of 99.5 with intermittent productive cough -her hypoxia is likely multi-factorial and suspect mostly driven by her remaining pulmonary edema, she has  some wheezing, mild infectious sx's and likely has mild COPD exacerbation vs CAP -start yupelri, brovana and pulmicort nebs -CAP coverage with azithromycin and ceftriaxone -continue solumedrol, start IS -continue diuresis per nephrology -check procalcitonin, urine strep, legionella and sputum culture -encourage tobacco cessation when patient is receptive -continue  HFNC to maintain sats >88%, pt is currently stable in current level of care -rest of plan per primary team  Thank you for this consult, we will continue to follow along with you   Best Practice (right click and "Reselect all SmartList Selections" daily)   Per primary team  Labs   CBC: Recent Labs  Lab 08/12/22 1708 08/13/22 0039 08/13/22 1546 08/14/22 0139 08/15/22 0104  WBC 7.3 6.5  --  8.1 6.9  NEUTROABS 5.0  --   --   --   --   HGB 7.0* 6.6* 7.4* 7.4* 7.3*  HCT 22.0* 20.4* 23.0* 23.1* 23.4*  MCV 97.3 92.3  --  90.2 91.4  PLT 371 363  --  378 329    Basic Metabolic Panel: Recent Labs  Lab 08/12/22 1708 08/12/22 2140 08/13/22 0039 08/14/22 0139 08/15/22 0104  NA 132*  --  132* 136 134*  K 3.4*  --  4.0 4.5 3.9  CL 103  --  102 106 100  CO2 14*  --  15* 17* 23  GLUCOSE 136*  --  149* 104* 92  BUN 58*  --  60* 64* 35*  CREATININE 4.68*  --  4.78* 4.56* 3.16*  CALCIUM 7.7*  --  7.6* 7.7* 7.8*  MG  --  1.9  --   --   --   PHOS  --   --   --   --  4.2   GFR: Estimated Creatinine Clearance: 18.2 mL/min (A) (by C-G formula based on SCr of 3.16 mg/dL (H)). Recent Labs  Lab 08/12/22 1708 08/12/22 1720 08/12/22 2140 08/13/22 0039 08/14/22 0139 08/15/22 0104  WBC 7.3  --   --  6.5 8.1 6.9  LATICACIDVEN  --  2.1* 0.8  --   --   --     Liver Function Tests: Recent Labs  Lab 08/12/22 1708 08/13/22 0039 08/15/22 0104  AST 16 16  --   ALT 11 10  --   ALKPHOS 77 73  --   BILITOT 0.6 0.4  --   PROT 7.1 6.7  --   ALBUMIN 2.2* 2.1* 2.0*   No results for input(s): "LIPASE", "AMYLASE" in the last 168 hours. No results for input(s): "AMMONIA" in the last 168 hours.  ABG    Component Value Date/Time   PHART 7.28 (L) 11/15/2021 0550   PCO2ART 27 (L) 11/15/2021 0550   PO2ART 121 (H) 11/15/2021 0550   HCO3 12.7 (L) 11/15/2021 0550   TCO2 16 (L) 08/19/2019 1432   ACIDBASEDEF 12.4 (H) 11/15/2021 0550   O2SAT 99 11/15/2021 0550     Coagulation  Profile: Recent Labs  Lab 08/12/22 2140  INR 1.1    Cardiac Enzymes: No results for input(s): "CKTOTAL", "CKMB", "CKMBINDEX", "TROPONINI" in the last 168 hours.  HbA1C: Hemoglobin A1C  Date/Time Value Ref Range Status  04/21/2015 02:37 PM 5.20  Final   Hgb A1c MFr Bld  Date/Time Value Ref Range Status  08/20/2019 04:10 AM 5.6 4.8 - 5.6 % Final    Comment:    (NOTE) Pre diabetes:          5.7%-6.4% Diabetes:              >  6.4% Glycemic control for   <7.0% adults with diabetes     CBG: Recent Labs  Lab 08/14/22 1250  GLUCAP 92    Review of Systems:   Please see the history of present illness. 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   Surgical History:   Past Surgical History:  Procedure Laterality Date   AV FISTULA PLACEMENT Right 08/14/2022   Procedure: RIGHT ARM BRACHIOBASILIC ATERIOVENOUS FISTULA CREATION;  Surgeon: Leonie Douglas, MD;  Location[facility] OR;  Service: Vascular;  Laterality: Right;   BIOPSY  01/01/2019   Procedure: BIOPSY;  Surgeon: Beverley Fiedler, MD;  Location: [facility] ENDOSCOPY;  Service: Endoscopy;;   BIOPSY  11/17/2021   Procedure: BIOPSY;  Surgeon: Lynann Bologna, MD;  Location: [facility] ENDOSCOPY;  Service: Gastroenterology;;   CESAREAN SECTION  1989   COLONOSCOPY  11/07/2020   2018   CYSTOSCOPY W/ URETERAL STENT PLACEMENT Left 08/01/2018   Procedure: CYSTOSCOPY WITH RETROGRADE PYELOGRAM/URETERAL STENT PLACEMENT;  Surgeon: Crista Elliot, MD;  Location: [facility] ORS;  Service:  Urology;  Laterality: Left;   CYSTOSCOPY WITH RETROGRADE PYELOGRAM, URETEROSCOPY AND STENT PLACEMENT Left 01/15/2019   Procedure: CYSTOSCOPY WITH RETROGRADE PYELOGRAM, URETEROSCOPY AND STENT PLACEMENT;  Surgeon: Crista Elliot, MD;  Location: [facility] ORS;  Service: Urology;  Laterality: Left;   ESOPHAGOGASTRODUODENOSCOPY (EGD) WITH PROPOFOL N/A 01/01/2019   Procedure: ESOPHAGOGASTRODUODENOSCOPY (EGD) WITH PROPOFOL;  Surgeon: Beverley Fiedler, MD;  Location: [facility] ENDOSCOPY;  Service: Endoscopy;  Laterality: N/A;   ESOPHAGOGASTRODUODENOSCOPY (EGD) WITH PROPOFOL N/A 08/21/2019   Procedure: ESOPHAGOGASTRODUODENOSCOPY (EGD) WITH PROPOFOL;  Surgeon: Napoleon Form, MD;  Location: [facility] ENDOSCOPY;  Service: Endoscopy;  Laterality: N/A;   ESOPHAGOGASTRODUODENOSCOPY (EGD) WITH PROPOFOL N/A 11/17/2021   Procedure: ESOPHAGOGASTRODUODENOSCOPY (EGD) WITH PROPOFOL;  Surgeon: Lynann Bologna, MD;  Location: [facility] ENDOSCOPY;  Service: Gastroenterology;  Laterality: N/A;   FRACTURE SURGERY Left 2011   arm   INSERTION OF DIALYSIS CATHETER Right 08/14/2022   Procedure: INSERTION OF TUNNELED DIALYSIS CATHETER USING PALINDROME CATHETER KIT 19CM;  Surgeon: Leonie Douglas, MD;  Location: [facility] OR;  Service: Vascular;  Laterality: Right;   UPPER GASTROINTESTINAL ENDOSCOPY       Social History:   reports that she has been smoking cigarettes. She has been smoking an average of 1.00 packs per day. She has been exposed to tobacco smoke. She has never used smokeless tobacco. She reports that she does not currently use alcohol. She reports current drug use. Drug: Marijuana.   Family History:  Her family history includes Diabetes in her father. There is no history of Colon cancer, Esophageal cancer, Rectal cancer, or Stomach cancer.   Allergies Allergies  Allergen Reactions   Zestril [Lisinopril] Other (See Comments)    Hyperkalemia     Home Medications  Prior to Admission medications   Medication Sig Start Date End Date Taking?  Authorizing Provider  acetaminophen (TYLENOL) 500 MG tablet Take 1,000 mg by mouth 2 (two) times daily as needed for moderate pain, fever or headache.   Yes [provider]  albuterol (VENTOLIN HFA) 108 (90 Base) MCG/ACT inhaler INHALE 2 PUFFS INTO THE  LUNGS EVERY 6 HOURS AS NEEDED FOR WHEEZING OR SHORTNESS OF BREATH. 01/21/22  Yes Claiborne Rigg, NP  amLODipine (NORVASC) 10 MG tablet Take 1 tablet (10 mg total) by mouth daily. 08/06/22  Yes Dahal, Melina Schools, MD  busPIRone (BUSPAR) 10 MG tablet Take 1 tablet (10 mg total) by mouth 2 (two) times daily. 01/21/22  Yes Claiborne Rigg, NP  ferrous sulfate 325 (65 FE) MG tablet Take 1 tablet (325 mg total) by mouth daily with breakfast. 08/05/22  Yes Dahal, Melina Schools, MD  Fluticasone-Umeclidin-Vilant (TRELEGY ELLIPTA) 100-62.5-25 MCG/ACT AEPB Inhale 1 puff into the lungs daily. 01/21/22  Yes Claiborne Rigg, NP  gabapentin (NEURONTIN) 100 MG capsule Take 1 capsule (100 mg total) by mouth 3 (three) times daily. 01/21/22  Yes Claiborne Rigg, NP  ipratropium-albuterol (DUONEB) 0.5-2.5 (3) MG/3ML SOLN Take 3 mLs by nebulization every 6 (six) hours as needed (shortness of breath and wheezing). Patient taking differently: Take 3 mLs by nebulization every 6 (six) hours as needed (shortness of breath, wheezing). 01/21/22  Yes Claiborne Rigg, NP  Multiple Vitamin (MULTIVITAMIN WITH MINERALS) TABS tablet Take 1 tablet by mouth daily. 06/29/20  Yes [name], MD  oxyCODONE-acetaminophen (PERCOCET/ROXICET) 5-325 MG tablet Take 1 tablet by mouth every 8 (eight) hours as needed for severe pain or moderate pain. 08/05/22  Yes Dahal, Melina Schools, MD  pantoprazole (PROTONIX) 40 MG tablet Take 1 tablet (40 mg total) by mouth daily. 08/05/22  Yes Dahal, Melina Schools, MD  sodium bicarbonate 650 MG tablet Take 2 tablets (1,300 mg total) by mouth 3 (three) times daily. 01/25/22  Yes   traZODone (DESYREL) 50 MG tablet Take 1 tablet (50 mg total) by mouth at bedtime as needed for  sleep. 01/21/22  Yes Claiborne Rigg, NP     Critical care time:  40 minutes    CRITICAL CARE Performed by: Darcella Gasman Kano Heckmann   Total critical care time: 40 minutes  Critical care time was exclusive of separately billable procedures and treating other patients.  Critical care was necessary to treat or prevent imminent or life-threatening deterioration.  Critical care was time spent personally by me on the following activities: development of treatment plan with patient and/or surrogate as well as nursing, discussions with consultants, evaluation of patient's response to treatment, examination of patient, obtaining history from patient or surrogate, ordering and performing treatments and interventions, ordering and review of laboratory studies, ordering and review of radiographic studies, pulse oximetry and re-evaluation of patient's condition.  Darcella Gasman Aidian Salomon, PA-C [facility] See Amion for pager If no response to pager , please call [phone] until 7pm After 7:00 pm call Elink  [phone]

## 2022-08-16 ENCOUNTER — Encounter (HOSPITAL_COMMUNITY)
Admission: EM | Disposition: A | Discharge status: Home / Self Care | Payer: Self-pay | Source: Home / Self Care | Attending: Internal Medicine

## 2022-08-16 ENCOUNTER — Encounter (HOSPITAL_COMMUNITY): Payer: Self-pay | Admitting: Internal Medicine

## 2022-08-16 ENCOUNTER — Other Ambulatory Visit: Payer: Self-pay

## 2022-08-16 ENCOUNTER — Inpatient Hospital Stay (HOSPITAL_COMMUNITY): Payer: Medicare Other | Admitting: Certified Registered"

## 2022-08-16 DIAGNOSIS — K5520 Angiodysplasia of colon without hemorrhage: Secondary | ICD-10-CM

## 2022-08-16 DIAGNOSIS — K317 Polyp of stomach and duodenum: Secondary | ICD-10-CM

## 2022-08-16 DIAGNOSIS — I110 Hypertensive heart disease with heart failure: Secondary | ICD-10-CM

## 2022-08-16 DIAGNOSIS — F17200 Nicotine dependence, unspecified, uncomplicated: Secondary | ICD-10-CM

## 2022-08-16 DIAGNOSIS — K31811 Angiodysplasia of stomach and duodenum with bleeding: Secondary | ICD-10-CM

## 2022-08-16 DIAGNOSIS — K3189 Other diseases of stomach and duodenum: Secondary | ICD-10-CM

## 2022-08-16 DIAGNOSIS — I509 Heart failure, unspecified: Secondary | ICD-10-CM

## 2022-08-16 DIAGNOSIS — K921 Melena: Secondary | ICD-10-CM

## 2022-08-16 DIAGNOSIS — R0902 Hypoxemia: Secondary | ICD-10-CM

## 2022-08-16 DIAGNOSIS — D649 Anemia, unspecified: Secondary | ICD-10-CM

## 2022-08-16 DIAGNOSIS — K449 Diaphragmatic hernia without obstruction or gangrene: Secondary | ICD-10-CM

## 2022-08-16 HISTORY — PX: ENTEROSCOPY: SHX5533

## 2022-08-16 HISTORY — PX: HEMOSTASIS CLIP PLACEMENT: SHX6857

## 2022-08-16 HISTORY — PX: HOT HEMOSTASIS: SHX5433

## 2022-08-16 HISTORY — PX: SUBMUCOSAL TATTOO INJECTION: SHX6856

## 2022-08-16 HISTORY — PX: POLYPECTOMY: SHX5525

## 2022-08-16 LAB — CBC
HCT: 26.3 % — ABNORMAL LOW (ref 36.0–46.0)
Hemoglobin: 8.3 g/dL — ABNORMAL LOW (ref 12.0–15.0)
MCH: 29.3 pg (ref 26.0–34.0)
MCHC: 31.6 g/dL (ref 30.0–36.0)
MCV: 92.9 fL (ref 80.0–100.0)
Platelets: 360 10*3/uL (ref 150–400)
RBC: 2.83 MIL/uL — ABNORMAL LOW (ref 3.87–5.11)
RDW: 19.3 % — ABNORMAL HIGH (ref 11.5–15.5)
WBC: 9.2 10*3/uL (ref 4.0–10.5)
nRBC: 0.7 % — ABNORMAL HIGH (ref 0.0–0.2)

## 2022-08-16 LAB — RENAL FUNCTION PANEL
Albumin: 2.1 g/dL — ABNORMAL LOW (ref 3.5–5.0)
Anion gap: 12 (ref 5–15)
BUN: 41 mg/dL — ABNORMAL HIGH (ref 8–23)
CO2: 23 mmol/L (ref 22–32)
Calcium: 8.2 mg/dL — ABNORMAL LOW (ref 8.9–10.3)
Chloride: 101 mmol/L (ref 98–111)
Creatinine, Ser: 3.6 mg/dL — ABNORMAL HIGH (ref 0.44–1.00)
GFR, Estimated: 14 mL/min — ABNORMAL LOW (ref >60)
Glucose, Bld: 109 mg/dL — ABNORMAL HIGH (ref 70–99)
Phosphorus: 4.1 mg/dL (ref 2.5–4.6)
Potassium: 4.4 mmol/L (ref 3.5–5.1)
Sodium: 136 mmol/L (ref 135–145)

## 2022-08-16 LAB — PTH, INTACT AND CALCIUM
Calcium, Total (PTH): 7.9 mg/dL — ABNORMAL LOW (ref 8.7–10.3)
PTH: 349 pg/mL — ABNORMAL HIGH (ref 15–65)

## 2022-08-16 LAB — URINE CULTURE

## 2022-08-16 LAB — CULTURE, BLOOD (ROUTINE X 2): Culture: NO GROWTH

## 2022-08-16 LAB — PROCALCITONIN: Procalcitonin: 0.38 ng/mL

## 2022-08-16 SURGERY — ENTEROSCOPY
Anesthesia: Monitor Anesthesia Care

## 2022-08-16 MED ORDER — PROPOFOL 10 MG/ML IV BOLUS
INTRAVENOUS | Status: DC | PRN
  Administered 2022-08-16: 20 mg via INTRAVENOUS
  Administered 2022-08-16: 50 mg via INTRAVENOUS
  Administered 2022-08-16: 20 mg via INTRAVENOUS

## 2022-08-16 MED ORDER — IPRATROPIUM-ALBUTEROL 0.5-2.5 (3) MG/3ML IN SOLN
RESPIRATORY_TRACT | Status: AC
  Administered 2022-08-16: 3 mL
  Filled 2022-08-16: qty 3

## 2022-08-16 MED ORDER — SPOT INK MARKER SYRINGE KIT
PACK | SUBMUCOSAL | Status: DC | PRN
  Administered 2022-08-16: 1.5 mL via SUBMUCOSAL

## 2022-08-16 MED ORDER — LIDOCAINE 2% (20 MG/ML) 5 ML SYRINGE
INTRAMUSCULAR | Status: DC | PRN
  Administered 2022-08-16: 60 mg via INTRAVENOUS

## 2022-08-16 MED ORDER — PANTOPRAZOLE SODIUM 40 MG PO TBEC: 40.0000 mg | DELAYED_RELEASE_TABLET | Freq: Two times a day (BID) | ORAL | Status: DC

## 2022-08-16 MED ORDER — SODIUM CHLORIDE 0.9 % IV SOLN: INTRAVENOUS | Status: DC | PRN

## 2022-08-16 MED ORDER — SUCRALFATE 1 G PO TABS
1.0000 g | ORAL_TABLET | Freq: Two times a day (BID) | ORAL | Status: DC
  Administered 2022-08-16 – 2022-08-20 (×8): 1 g via ORAL
  Filled 2022-08-16 (×10): qty 1

## 2022-08-16 MED ORDER — IPRATROPIUM-ALBUTEROL 0.5-2.5 (3) MG/3ML IN SOLN
3.0000 mL | Freq: Once | RESPIRATORY_TRACT | Status: DC
  Filled 2022-08-16: qty 3

## 2022-08-16 MED ORDER — ANTICOAGULANT SODIUM CITRATE 4% (200MG/5ML) IV SOLN
5.0000 mL | Status: DC | PRN
  Filled 2022-08-16: qty 5

## 2022-08-16 MED ORDER — PROPOFOL 500 MG/50ML IV EMUL
INTRAVENOUS | Status: DC | PRN
  Administered 2022-08-16: 100 ug/kg/min via INTRAVENOUS

## 2022-08-16 MED ORDER — SODIUM CHLORIDE 0.9 % IV SOLN: INTRAVENOUS | Status: DC

## 2022-08-16 MED ORDER — SPOT INK MARKER SYRINGE KIT
PACK | SUBMUCOSAL | Status: AC
  Filled 2022-08-16: qty 5

## 2022-08-16 MED ORDER — ALTEPLASE 2 MG IJ SOLR
2.0000 mg | Freq: Once | INTRAMUSCULAR | Status: DC | PRN
  Filled 2022-08-16: qty 2

## 2022-08-16 MED ORDER — PANTOPRAZOLE SODIUM 40 MG PO TBEC
40.0000 mg | DELAYED_RELEASE_TABLET | Freq: Two times a day (BID) | ORAL | Status: DC
  Administered 2022-08-16 – 2022-08-20 (×9): 40 mg via ORAL
  Filled 2022-08-16 (×9): qty 1

## 2022-08-16 MED ORDER — HEPARIN SODIUM (PORCINE) 1000 UNIT/ML DIALYSIS
1000.0000 [IU] | INTRAMUSCULAR | Status: DC | PRN
  Administered 2022-08-16: 1000 [IU]
  Filled 2022-08-16 (×3): qty 1

## 2022-08-16 NOTE — Progress Notes (Signed)
Transported off unit to [facility] Endo unit with RN staff from Endoscopy unit.

## 2022-08-16 NOTE — TOC Progression Note (Signed)
Transition of Care [facility]) - Progression Note    Patient Details  Name: [name] MRN: [id number] Date of Birth: April 15, 1960  [facility]) CM/SW Contact  Leander Rams, LCSW Phone Number: 08/16/2022, 2:56 PM  Clinical Narrative:    Transportation can't not be set up without a clinic for HD. GOC discussions are being had.   TOC will continue to follow.    Expected Discharge Plan: Home/Self Care Barriers to Discharge: Inadequate or no insurance, Transportation  Expected Discharge Plan and Services In-house Referral: NA Discharge Planning Services: CM Consult Post Acute Care Choice: NA Living arrangements for the past 2 months: Hotel/Motel                 DME Arranged: N/A DME Agency: NA       HH Arranged: NA           Social Determinants of Health (SDOH) Interventions SDOH Screenings   Food Insecurity: No Food Insecurity (08/13/2022)  Housing: Patient Unable To Answer (08/13/2022)  Transportation Needs: No Transportation Needs (08/13/2022)  Recent Concern: Transportation Needs - Unmet Transportation Needs (08/02/2022)  Utilities: Not At Risk (08/13/2022)  Depression (PHQ2-9): Medium Risk (01/21/2022)  Tobacco Use: High Risk (08/16/2022)    Readmission Risk Interventions    08/13/2022    4:47 PM 08/02/2022    1:56 PM 11/16/2021   12:16 PM  Readmission Risk Prevention Plan  Transportation Screening Complete Complete Complete  PCP or Specialist Appt within 3-5 Days   Complete  HRI or Home Care Consult   Complete  Social Work Consult for Recovery Care Planning/Counseling   Complete  Palliative Care Screening   Not Applicable  Medication Review Oceanographer) Complete Referral to Pharmacy Complete  PCP or Specialist appointment within 3-5 days of discharge Complete Complete   HRI or Home Care Consult Complete Complete   SW Recovery Care/Counseling Consult  Complete   Palliative Care Screening Not Applicable Not Applicable   Skilled Nursing Facility Not  Applicable Not Applicable    Oletta Lamas, MSW, [place], LCASA Transitions of Care  Clinical Social Worker I

## 2022-08-16 NOTE — Progress Notes (Signed)
Arrived back from Dialysis at this time.  Vital signs obtained.    08/16/22 1731  Vitals  Temp 97.9 F (36.6 C)  Temp Source Axillary  BP (!) 168/78  MAP (mmHg) 102  BP Location Left Wrist  BP Method Automatic  Patient Position (if appropriate) Lying  Pulse Rate 93  Pulse Rate Source Monitor  ECG Heart Rate 90  Resp 18  MEWS COLOR  MEWS Score Color Green  Oxygen Therapy  SpO2 91 %  O2 Device HFNC  O2 Flow Rate (L/min) 8 L/min  MEWS Score  MEWS Temp 0  MEWS Systolic 0  MEWS Pulse 0  MEWS RR 0  MEWS LOC 0  MEWS Score 0

## 2022-08-16 NOTE — Anesthesia Postprocedure Evaluation (Signed)
Anesthesia Post Note  Patient: [name]  Procedure(s) Performed: ENTEROSCOPY HOT HEMOSTASIS (ARGON PLASMA COAGULATION/BICAP) POLYPECTOMY SUBMUCOSAL TATTOO INJECTION HEMOSTASIS CLIP PLACEMENT     Patient location during evaluation: Endoscopy Anesthesia Type: MAC Level of consciousness: awake and alert Pain management: pain level controlled Vital Signs Assessment: post-procedure vital signs reviewed and stable Respiratory status: spontaneous breathing, nonlabored ventilation, respiratory function stable and patient connected to nasal cannula oxygen Cardiovascular status: blood pressure returned to baseline and stable Postop Assessment: no apparent nausea or vomiting Anesthetic complications: no  No notable events documented.  Last Vitals:  Vitals:   08/16/22 0900 08/16/22 0921  BP: (!) 165/66 100/68  Pulse: 78 80  Resp: 17 20  Temp:  36.7 C  SpO2: 92% 91%    Last Pain:  Vitals:   08/16/22 0921  TempSrc: Oral  PainSc: 3                  Lindberg [name]

## 2022-08-16 NOTE — Transfer of Care (Signed)
Immediate Anesthesia Transfer of Care Note  Patient: [name]  Procedure(s) Performed: ENTEROSCOPY HOT HEMOSTASIS (ARGON PLASMA COAGULATION/BICAP) POLYPECTOMY SUBMUCOSAL TATTOO INJECTION HEMOSTASIS CLIP PLACEMENT GIVENS CAPSULE STUDY  Patient Location: PACU and Endoscopy Unit  Anesthesia Type:MAC  Level of Consciousness: awake and oriented  Airway & Oxygen Therapy: Patient Spontanous Breathing and Patient connected to nasal cannula oxygen  Post-op Assessment: Report given to RN and Post -op Vital signs reviewed and stable  Post vital signs: Reviewed and stable   Last Vitals:  Vitals Value Taken Time  BP    Temp    Pulse 96 08/16/22 0833  Resp    SpO2 93 % 08/16/22 0833  Vitals shown include unvalidated device data.  Last Pain:  Vitals:   08/16/22 0718  TempSrc: Temporal  PainSc: 0-No pain      Patients Stated Pain Goal: 2 (08/16/22 0601)  Complications: No notable events documented.

## 2022-08-16 NOTE — Progress Notes (Signed)
Arrived back from Endo lab escorted by RN. Handoff completed with Norberta Keens.  Vitals obtained and breakfast ordered.     08/16/22 0921  Vitals  Temp 98 F (36.7 C)  Temp Source Oral  BP 100/68  MAP (mmHg) 78  BP Location Left Arm  BP Method Automatic  Patient Position (if appropriate) Lying  Pulse Rate 80  Pulse Rate Source Monitor  ECG Heart Rate 81  Resp 20  Level of Consciousness  Level of Consciousness Alert  MEWS COLOR  MEWS Score Color Green  Oxygen Therapy  SpO2 91 %  O2 Device HFNC  O2 Flow Rate (L/min) 8 L/min  Pain Assessment  Pain Scale 0-10  Pain Score 3  Pain Type Surgical pain  Pain Location Shoulder  Pain Orientation Right  Pain Descriptors / Indicators Discomfort  Pain Intervention(s) Repositioned  MEWS Score  MEWS Temp 0  MEWS Systolic 1  MEWS Pulse 0  MEWS RR 0  MEWS LOC 0  MEWS Score 1

## 2022-08-16 NOTE — Progress Notes (Signed)
POST HD TX NOTE  08/16/22 1656  Vitals  Temp 98.1 F (36.7 C)  Temp Source Oral  BP (!) 176/63  MAP (mmHg) 93  BP Location Left Wrist  BP Method Automatic  Patient Position (if appropriate) Lying  Pulse Rate 90  Pulse Rate Source Monitor  ECG Heart Rate 82  Resp 16  Oxygen Therapy  SpO2 93 %  O2 Device HFNC  O2 Flow Rate (L/min) 10 L/min  Pulse Oximetry Type Continuous  During Treatment Monitoring  Intra-Hemodialysis Comments (S)   (post HD tx VS check)  Post Treatment  Dialyzer Clearance Clear  Duration of HD Treatment -hour(s) 2.5 hour(s)  Hemodialysis Intake (mL) 270 mL (Ferric Gluconate)  Liters Processed 37.5  Fluid Removed (mL) 1530 mL ( (value from machine) - (ferric gluconate) = )  Tolerated HD Treatment Yes  Post-Hemodialysis Comments (S)  tx completed w/o problem, UF goal met, blood rinsed back, VSS. Medication Admin: Ferric gluconate 250mg  IVPB, Percocet 5/325mg  po, Heparin Dwells 3200 units  Hemodialysis Catheter Right Internal jugular Double lumen Permanent (Tunneled)  Placement Date/Time: 08/14/22 1144   Placed prior to admission: No  Serial / Lot #: [id number]  Expiration Date: 01/22/27  Time Out: Correct patient;Correct site;Correct procedure  Maximum sterile barrier precautions: Hand hygiene;Cap;Mask;Sterile gow...  Site Condition Painful  Blue Lumen Status Heparin locked;Dead end cap in place  Red Lumen Status Heparin locked;Dead end cap in place  Purple Lumen Status N/A  Catheter fill solution Heparin 1000 units/ml  Catheter fill volume (Arterial) 1.6 cc  Catheter fill volume (Venous) 1.6  Dressing Type Transparent  Dressing Status Antimicrobial disc in place;Clean, Dry, Intact;Clean  Drainage Description None  Dressing Change Due 08/23/22  Post treatment catheter status Capped and Clamped

## 2022-08-16 NOTE — Plan of Care (Signed)
  Problem: Nutrition: Goal: Adequate nutrition will be maintained Outcome: Completed/Met   Problem: Nutritional: Goal: Ability to make healthy dietary choices will improve Outcome: Completed/Met

## 2022-08-16 NOTE — Progress Notes (Signed)
Pt's referral to [facility] for out-pt HD is still pending. Pt's case requires financial clearance due to pt having medicare part A only. Will provide update to team as updates are received from [facility]. Will assist as needed.   Olivia Canter Renal Navigator [phone]

## 2022-08-16 NOTE — Care Management Important Message (Signed)
Important Message  Patient Details  Name: [name] MRN: [id number] Date of Birth: [birth year]   Medicare Important Message Given:  Yes     Renie Ora 08/16/2022, 11:06 AM

## 2022-08-16 NOTE — Progress Notes (Signed)
NAMESAMNANG PILI, MRN:  [id number], DOB:  1960-07-24, LOS: 3 ADMISSION DATE:  08/12/2022, CONSULTATION DATE:  08/16/22 REFERRING MD:  Dayna Barker, CHIEF COMPLAINT:  hypoxia, SOB   History of Present Illness:  62 y.o. F with PMH significant for CKD stage IV, HFpEF, polysubstance abuse, tobacco use and COPD not on CPAP or home O2 who presented to the ED 5/28 with complaints of shortness of breath and LE swelling.  She had recently been admitted 5/16-5/20 for chronic GIB and iron deficiency anemia and discharged back to motel where she is living.  She required HFNC and CXR showed pulmonary edema.  She has had some productive cough and is still smoking (since age 61) about 0.5ppd. BNP was elevated to 1084.   Pt was admitted to [facility] and treated with lasix, steroids and nebulizers as she was wheezing on exam.   Nephrology was consulted and noted overall failure to thrive and CKD now progressing to ESRD and R IJ tunneled dialysis catheter was placed with creation of RUE AV fistula creation on 5/29.   First HD session the same with 1L removed, she also had 1700cc UOP.  On 5/30 she remained on HFNC 10L with wheezing, therefore PCCM consulted for further recommendations for hypoxia.  Pt up eating and in no distress, she says her breathing is "fine" and would like not to be bothered.   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   Significant Hospital Events: Including procedures, antibiotic start and stop dates in addition to other pertinent events   5/28 admitted to [facility] 5/29  started HD 5/30 remains on HFNC, PCCM consult, added steroids and BDs. Abx started.  Had her first round of hemodialysis 5/31 down to 8 lpm still has cough   Interim History / Subjective:  Feels fatigued from yesterday.  She is not looking forward to dialysis later today still has a cough  Objective   Blood pressure 100/68, pulse 80, temperature 98 F (36.7 C), temperature source Oral, resp. rate 20, height 5\' 7"  (1.702 m), weight 63.5 kg, SpO2 91 %.        Intake/Output Summary (Last 24 hours) at 08/16/2022 1045 Last data filed at 08/16/2022 4098 Gross per 24 hour  Intake 680 ml  Output 900 ml  Net -220 ml   Filed Weights   08/14/22 1815 08/15/22 0604 08/16/22 0500  Weight: 64.7 kg 70.3 kg 63.5 kg   General chronically ill-appearing 62 year old female patient currently lying in bed speaking with her daughter on the phone she is in no acute distress HEENT normocephalic atraumatic no jugular venous distention appreciated Pulmonary: Coarse scattered rhonchi throughout, rhonchorous cough which is nonproductive Portable chest x-ray personally reviewed from 5/30 shows dialysis catheter in satisfactory position diffuse bilateral airspace disease consistent with edema but cannot rule out right lower lobe pneumonia Cardiac: Regular rate and rhythm Abdomen: Soft nontender Extremities: Warm dry brisk cap refill Neuro: Awake oriented no focal deficits  Resolved Hospital Problem list     Assessment & Plan:  Acute Hypoxic Respiratory Failure due to volume overload from ESRD HFpEF mild AECOPD baseline COPD and tobacco use OSA  Stage V CKD/ESRD Acute blood loss anemia  2/2 bleeding AVMs (discovered via EGD 5/30)   Acute Hypoxic Respiratory Failure due to volume overload from ESRD, HFpEF w/ mild AECOPD (baseline COPD and tobacco use) Plan Volume removal via intermittent dialysis Continue scheduled bronchodilators Can decrease Solu-Medrol to 40 mg daily, initiate slow prednisone taper  over the weekend Day #2 azithromycin and day #2 ceftriaxone Follow-up a.m. chest x-ray Wean O2 Continue pulse oximeter   Best Practice (right click and "Reselect all SmartList Selections" daily)   Per primary team  Simonne Martinet ACNP-BC [facility] Pulmonary/Critical Care Pager # [phone] OR # [phone] if no answer

## 2022-08-16 NOTE — Interval H&P Note (Signed)
History and Physical Interval Note:  08/16/2022 7:45 AM  [name]  has presented today for surgery, with the diagnosis of Melena/ acute on chronic anemia.  The various methods of treatment have been discussed with the patient and family. After consideration of risks, benefits and other options for treatment, the patient has consented to  Procedure(s): ENTEROSCOPY (N/A) GIVENS CAPSULE STUDY (N/A) as a surgical intervention.  The patient's history has been reviewed, patient examined, no change in status, stable for surgery.  I have reviewed the patient's chart and labs.  Questions were answered to the patient's satisfaction.     [employer]

## 2022-08-16 NOTE — Progress Notes (Addendum)
PROGRESS NOTE [name]  FAO:[id number] DOB: [birth year] DOA: 08/12/2022 PCP: [name], MD  Brief Narrative/Hospital Course: 62 year old female with complex medical comorbidities with PAD GERD gastritis, hypertension, anemia, CKD 5, COPD, hepatitis C, Hyperlipidemia, recent admission [phone] for symptomatic anemia due to chronic GI bleeding needing 2 units PRBC, FOBT was negative, presenting with fluid retention shortness of breath, in the triage initially hypoxic 80%, noted to have anasarca in legs arms and abdomen. In the ED mildly tachycardic, he did not with nasal cannula, BP on higher side 160s to 170s mild tachypnea-24 Labs creatinine elevated 4.19 hemoglobin 7.1 g> 7.0 g, lactic acid 2.1> 0.8, BNP elevated 1084. Chest x-ray cardiomegaly and pulmonary edema with diffuse bilateral interstitial pulmonary opacities right greater than left small pleural effusion. Patient admitted working diagnosis of acute hypoxic aspiratory failure pretension in the setting of CHF exacerbation possible COPD exacerbation. Her baseline weight went has gone up but she is note sure how much. HH overnight at 6.6 g> received 1 unit PRBC transfusion 5/28. Closely followed by nephrology, given the fluid overload underwent TDC placement and dialysis started. She had been having worsening hypoxia ever since her [facility] placement imaging with fluid overload, pulmonary critical care has been consulted 5/30.  Patient still has been updated palliative care was also consulted 5/30 patient and daughter requestied PMT to change to DNR S/p SBE:5/31: 9 AVMs noted. 1 with evidence of recent stigmata of bleeding.  GI advised to continue PPI and outpatient follow-up    Subjective: Seen and examined this morning About to have her meal She is not in distress although she is an 8 L high flow nasal cannula she denies any shortness of breath Overnight patient has been afebrile. Underwent SP EGD showing 9 AVMs   Assessment and  Plan: Principal Problem:   Acute on chronic diastolic CHF (congestive heart failure) (HCC) Active Problems:   Chronic hepatitis C without hepatic coma (HCC)   HLD (hyperlipidemia)   Hypertension   Chronic leg pain   Anxiety and depression   Gastroesophageal reflux disease   IDA (iron deficiency anemia)   Chronic obstructive pulmonary disease (HCC)   Polysubstance abuse (HCC)   Gastritis and gastroduodenitis   PUD (peptic ulcer disease)   CKD (chronic kidney disease) stage 5, GFR less than 15 ml/min (HCC)   Acute respiratory failure with hypoxia (HCC)   Heme positive stool   Acute on chronic anemia   Hypoxia   Acute on chronic congestive heart failure (HCC)  Acute hypoxic respiratory failure Acute pulmonary edema Acute on chronic diastolic CHF and fluid overload due to ESRD:: multifactorial due to volume overload secondary to stage V CKD now progressed to ESRD and also with acute on chronic diastolic CHF, COPD.  Concern for pneumonia antibiotic coverage added 5/30 per PCCM Continue to address volume with dialysis Lasix per nephrology.  Appreciate pulmonary input continue HFNC wean oxygen as tolerated.   Continue systemic steroids, bronchodilators.   Although needing HFNC she is not in distress able to speak in full sentence and eating meal  Progressive CKD stage V-now progressing to ESRD: Now on dialysis nephrology following TDC placed and first HD 5/29. 2nd HD for 5/31. Net IO Since Admission: -992.92 mL [08/16/22 1022]   Metabolic acidosis in the setting of CKD/ESRD, improved,monitor  Acute on chronic diastolic CHF and fluid overload:cxr with pulmonary edema and bilateral small effusions and cardiomegaly BNP elevated 1084Echo from 1223 EF 55-60% with G1 DD on Lasix previously but was held due  to AKI/dehydration.  Continue diuretics and dialysis per nephrology.    Acute COPD exacerbation Current active smoker: Added IV Solu-Medrol 5/30, nebs changed to Brovana, Pulmicort,  continue bronchodilators supplemental oxygen.  Anemia multifactorial likely from anemia of chronic kidney disease PUD/gastritis/GERD and diverticulosis FOBT+ Acute blood loss anemia due to GI bleeding from AVM s/p 1 unit PRBC -anemia panel done recently with low iron storage ON EPO/ IV Iron per nephro. GI consulted-S/p SBE:5/31: 9 AVMs noted. 1 with evidence of recent stigmata of bleeding.  GI advised to continue PPI and outpatient follow-up.  Monitor and transfuse hemoglobin if less than 7 g Recent Labs  Lab 08/13/22 0039 08/13/22 1546 08/14/22 0139 08/15/22 0104 08/16/22 0036  HGB 6.6* 7.4* 7.4* 7.3* 8.3*  HCT 20.4* 23.0* 23.1* 23.4* 26.3*     HTN:BP on higher side allow high bp for fluid removal/HD and IV Lasix, holding home amlodipine Substance ZOX:WRUEAVW currently using marijuana only. Depression/Anxiety:Mood stable ,continue home BuSpar and trazodone Neuropathy:Continue home Gabapentin, she also takes Percocet 1 every 8 hours at home.   GOC: Palliative care consultation has been appreciated they had discussed with patient and the daughter, wishing for DNR.   DVT prophylaxis: Place and maintain sequential compression device Start: 08/13/22 1130 Code Status:   Code Status: Full Code Family Communication: plan of care discussed with patient at bedside. I called her daughter [name] and [name] ( significant other- friend) 5/29 no answer.  I called her other daughter  [name] [phone]  and upated 5/30, they arrived at the bedside to have a discussion with palliative care 5/30.  Patient status is: Inpatient because of ESRD Level of care: Telemetry Cardiac   Dispo: The patient is from: HOME            Anticipated disposition: TBD Objective: Vitals last 24 hrs: Vitals:   08/16/22 0841 08/16/22 0851 08/16/22 0900 08/16/22 0921  BP: (!) 162/70 (!) 164/67 (!) 165/66 100/68  Pulse: 83 80 78 80  Resp: (!) 21 12 17 20   Temp:    98 F (36.7 C)  TempSrc:    Oral  SpO2: 96% 95% 92% 91%   Weight:      Height:       Weight change: -2.342 kg  Physical Examination: General exam: AAOX3,weak,older appearing HEENT:Oral mucosa moist, Ear/Nose WNL grossly, dentition normal. Respiratory system: bilaterally diminished BS, chest TDC +,no use of accessory muscle Cardiovascular system: S1 & S2 +,regular rate. Gastrointestinal system: Abdomen soft, NT,ND,BS+ Nervous System:Alert, awake, moving extremities and grossly nonfocal Extremities: LE ankle edema MILD , lower extremities warm Skin: No rashes,no icterus. MSK: Normal muscle bulk,tone, power   Medications reviewed:  Scheduled Meds:  arformoterol  15 mcg Nebulization BID   azithromycin  500 mg Oral Daily   budesonide (PULMICORT) nebulizer solution  0.25 mg Nebulization BID   busPIRone  10 mg Oral BID   calcium acetate  667 mg Oral TID WC   Chlorhexidine Gluconate Cloth  6 each Topical Q0600   Chlorhexidine Gluconate Cloth  6 each Topical Q0600   darbepoetin (ARANESP) injection - NON-DIALYSIS  200 mcg Subcutaneous Q Tue-1800   ferrous sulfate  325 mg Oral Q breakfast   furosemide  80 mg Intravenous BID   gabapentin  100 mg Oral TID   liver oil-zinc oxide   Topical BID   methylPREDNISolone (SOLU-MEDROL) injection  80 mg Intravenous Q24H   pantoprazole  40 mg Oral BID AC   revefenacin  175 mcg Nebulization Daily   sodium bicarbonate  1,300 mg Oral TID   sodium chloride flush  3 mL Intravenous Q12H   sucralfate  1 g Oral BID   Continuous Infusions:  cefTRIAXone (ROCEPHIN)  IV Stopped (08/15/22 1452)   ferric gluconate (FERRLECIT) IVPB 250 mg (08/15/22 0856)    Diet Order             Diet renal/carb modified with fluid restriction Diet-HS Snack? Nothing; Fluid restriction: 1500 mL Fluid; Room service appropriate? Yes; Fluid consistency: Thin  Diet effective now                   Intake/Output Summary (Last 24 hours) at 08/16/2022 1022 Last data filed at 08/16/2022 0454 Gross per 24 hour  Intake 680 ml  Output  900 ml  Net -220 ml   Net IO Since Admission: -992.92 mL [08/16/22 1022]  Wt Readings from Last 3 Encounters:  08/16/22 63.5 kg  08/04/22 57 kg  06/07/22 51.3 kg     Unresulted Labs (From admission, onward)     Start     Ordered   08/15/22 1503  Urine Culture  Once,   AD        08/15/22 1503   08/15/22 1314  Strep pneumoniae urinary antigen  Once,   R        08/15/22 1313   08/15/22 1314  Legionella Pneumophila Serogp 1 Ur Ag  Once,   R        08/15/22 1313   08/15/22 1300  Expectorated Sputum Assessment w Gram Stain, Rflx to Resp Cult  Once,   R        08/15/22 1259   08/15/22 0500  PTH, intact and calcium  Tomorrow morning,   R        08/14/22 0843   08/15/22 0500  Renal function panel  Daily,   R      08/14/22 0845   08/14/22 0500  CBC  Daily,   R      08/13/22 0938   08/12/22 1708  Urinalysis, w/ Reflex to Culture (Infection Suspected) -Urine, Clean Catch  Once,   URGENT       Question:  Specimen Source  Answer:  Urine, Clean Catch   08/12/22 1707   Signed and Held  Renal function panel  Once,   R        Signed and Held   Signed and Held  CBC  Once,   R        Signed and Held          Data Reviewed: I have personally reviewed following labs and imaging studies CBC: Recent Labs  Lab 08/12/22 1708 08/13/22 0039 08/13/22 1546 08/14/22 0139 08/15/22 0104 08/16/22 0036  WBC 7.3 6.5  --  8.1 6.9 9.2  NEUTROABS 5.0  --   --   --   --   --   HGB 7.0* 6.6* 7.4* 7.4* 7.3* 8.3*  HCT 22.0* 20.4* 23.0* 23.1* 23.4* 26.3*  MCV 97.3 92.3  --  90.2 91.4 92.9  PLT 371 363  --  378 329 360   Basic Metabolic Panel: Recent Labs  Lab 08/12/22 1708 08/12/22 2140 08/13/22 0039 08/14/22 0139 08/15/22 0104 08/16/22 0036  NA 132*  --  132* 136 134* 136  K 3.4*  --  4.0 4.5 3.9 4.4  CL 103  --  102 106 100 101  CO2 14*  --  15* 17* 23 23  GLUCOSE 136*  --  149* 104* 92  109*  BUN 58*  --  60* 64* 35* 41*  CREATININE 4.68*  --  4.78* 4.56* 3.16* 3.60*  CALCIUM 7.7*  --   7.6* 7.7* 7.8* 8.2*  MG  --  1.9  --   --   --   --   PHOS  --   --   --   --  4.2 4.1  GFR: Recent Labs  Lab 08/12/22 1708 08/13/22 0039 08/15/22 0104 08/16/22 0036  AST 16 16  --   --   ALT 11 10  --   --   ALKPHOS 77 73  --   --   BILITOT 0.6 0.4  --   --   PROT 7.1 6.7  --   --   ALBUMIN 2.2* 2.1* 2.0* 2.1*   Recent Labs  Lab 08/12/22 2140  INR 1.1   Recent Labs  Lab 08/14/22 1250  GLUCAP 92   Recent Labs  Lab 08/12/22 1720 08/12/22 2140 08/15/22 0058 08/16/22 0036  PROCALCITON  --   --  0.38 0.38  LATICACIDVEN 2.1* 0.8  --   --    Recent Results (from the past 240 hour(s))  Culture, blood (Routine x 2)     Status: None (Preliminary result)   Collection Time: 08/12/22  5:08 PM   Specimen: BLOOD RIGHT ARM  Result Value Ref Range Status   Specimen Description BLOOD RIGHT ARM  Final   Special Requests   Final    BOTTLES DRAWN AEROBIC AND ANAEROBIC Blood Culture results may not be optimal due to an inadequate volume of blood received in culture bottles   Culture   Final    NO GROWTH 4 DAYS Performed at [facility], [address], [place], [place] [address]    Report Status PENDING  Incomplete  Culture, blood (Routine x 2)     Status: None (Preliminary result)   Collection Time: 08/12/22  5:13 PM   Specimen: BLOOD  Result Value Ref Range Status   Specimen Description BLOOD LEFT ANTECUBITAL  Final   Special Requests   Final    BOTTLES DRAWN AEROBIC AND ANAEROBIC Blood Culture adequate volume   Culture   Final    NO GROWTH 4 DAYS Performed at [facility], [address] [place], [place] [address]    Report Status PENDING  Incomplete  Respiratory (~20 pathogens) panel by PCR     Status: None   Collection Time: 08/15/22  5:25 PM   Specimen: Nasopharyngeal Swab; Respiratory  Result Value Ref Range Status   Adenovirus NOT DETECTED NOT DETECTED Final   Coronavirus 229E NOT DETECTED NOT DETECTED Final    Comment: (NOTE) The Coronavirus on the  Respiratory Panel, DOES NOT test for the novel  Coronavirus (2019 nCoV)    Coronavirus HKU1 NOT DETECTED NOT DETECTED Final   Coronavirus NL63 NOT DETECTED NOT DETECTED Final   Coronavirus OC43 NOT DETECTED NOT DETECTED Final   Metapneumovirus NOT DETECTED NOT DETECTED Final   Rhinovirus / Enterovirus NOT DETECTED NOT DETECTED Final   Influenza A NOT DETECTED NOT DETECTED Final   Influenza B NOT DETECTED NOT DETECTED Final   Parainfluenza Virus 1 NOT DETECTED NOT DETECTED Final   Parainfluenza Virus 2 NOT DETECTED NOT DETECTED Final   Parainfluenza Virus 3 NOT DETECTED NOT DETECTED Final   Parainfluenza Virus 4 NOT DETECTED NOT DETECTED Final   Respiratory Syncytial Virus NOT DETECTED NOT DETECTED Final   Bordetella pertussis NOT DETECTED NOT DETECTED Final  Bordetella Parapertussis NOT DETECTED NOT DETECTED Final   Chlamydophila pneumoniae NOT DETECTED NOT DETECTED Final   Mycoplasma pneumoniae NOT DETECTED NOT DETECTED Final    Comment: Performed at [facility], [address] [place], [place] [address]  SARS Coronavirus 2 by RT PCR (hospital order, performed in [facility] hospital lab) *cepheid single result test* Anterior Nasal Swab     Status: None   Collection Time: 08/15/22  5:25 PM   Specimen: Anterior Nasal Swab  Result Value Ref Range Status   SARS Coronavirus 2 by RT PCR NEGATIVE NEGATIVE Final    Comment: Performed at [facility], [address] [place], [place] [address]    Antimicrobials: Anti-infectives (From admission, onward)    Start     Dose/Rate Route Frequency Ordered Stop   08/15/22 1415  cefTRIAXone (ROCEPHIN) 1 g in sodium chloride 0.9 % 100 mL IVPB        1 g 200 mL/hr over 30 Minutes Intravenous Every 24 hours 08/15/22 1315     08/15/22 1415  azithromycin (ZITHROMAX) 500 mg in sodium chloride 0.9 % 250 mL IVPB  Status:  Discontinued        500 mg 250 mL/hr over 60 Minutes Intravenous Every 24 hours 08/15/22 1315 08/15/22 1322   08/15/22  1415  azithromycin (ZITHROMAX) tablet 500 mg        500 mg Oral Daily 08/15/22 1322     08/14/22 0600  cefUROXime (ZINACEF) 1.5 g in sodium chloride 0.9 % 100 mL IVPB        1.5 g 200 mL/hr over 30 Minutes Intravenous On call to O.R. 08/13/22 1243 08/14/22 1335     Culture/Microbiology    Component Value Date/Time   SDES BLOOD LEFT ANTECUBITAL 08/12/2022 1713   SPECREQUEST  08/12/2022 1713    BOTTLES DRAWN AEROBIC AND ANAEROBIC Blood Culture adequate volume   CULT  08/12/2022 1713    NO GROWTH 4 DAYS Performed at [facility], [address] [place], [place] [address]    REPTSTATUS PENDING 08/12/2022 1713   Radiology Studies: [facility] Chest Port 1 View  Result Date: 08/15/2022 CLINICAL DATA:  Hypoxia.  Cough and fever. EXAM: PORTABLE CHEST 1 VIEW COMPARISON:  Aug 14, 2022. FINDINGS: Diffuse interstitial and patchy airspace opacities. No visible pneumothorax. Hazy bibasilar opacities likely are in part due to layering pleural effusions. Enlarged cardiac silhouette. Pulmonary vascular congestion. Right IJ approach central venous catheter with the tip projecting at the superior right atrium. IMPRESSION: 1. Cardiomegaly, pulmonary vascular congestion diffuse interstitial airspace opacities, probable layering pleural effusions, suspicious for pulmonary edema. Findings appear mildly worsened compared to May 29, 24. 2. Superimposed infection is not excluded. Electronically Signed   By: Feliberto Harts M.D.   On: 08/15/2022 11:20   DG CHEST PORT 1 VIEW  Result Date: 08/14/2022 CLINICAL DATA:  Status post dialysis catheter insertion. EXAM: PORTABLE CHEST 1 VIEW COMPARISON:  08/12/2022 FINDINGS: Right jugular dialysis catheter has been placed. The catheter tip is in the upper SVC region. Negative for pneumothorax. Patchy airspace densities in both lungs are concerning for pulmonary edema. Increased densities in the retrocardiac space could represent pleural fluid or atelectasis. Heart size is  mildly enlarged. IMPRESSION: 1. Placement of right jugular dialysis catheter with the catheter tip in the SVC region. Negative for pneumothorax. 2. Patchy airspace densities in both lungs are concerning for pulmonary edema. 3. Increased densities in the retrocardiac space could represent pleural fluid or atelectasis. Electronically Signed  By: Richarda Overlie M.D.   On: 08/14/2022 13:39   DG C-Arm 1-60 Min-No Report  Result Date: 08/14/2022 Fluoroscopy was utilized by the requesting physician.  No radiographic interpretation.    LOS: 3 days  Lanae Boast, MD  [facility] 08/16/2022, 10:22 AM

## 2022-08-16 NOTE — Op Note (Signed)
[facility] Patient Name: [name] Procedure Date : 08/16/2022 MRN: [id number] Attending MD: Corliss Parish , MD, [id number] Date of Birth: 17-Feb-1961 CSN: [id number] Age: 62 Admit Type: Inpatient Procedure:                Small bowel enteroscopy Indications:              Anemia, Melena, Obscure gastrointestinal bleeding,                            Recurrent gastrointestinal bleeding Providers:                Corliss Parish, MD, Adolph Pollack, RN,                            Rozetta Nunnery, Technician Referring MD:             Inpatient medical service Medicines:                Monitored Anesthesia Care Complications:            No immediate complications. Estimated Blood Loss:     Estimated blood loss was minimal. Procedure:                Pre-Anesthesia Assessment:                           - Prior to the procedure, a History and Physical                            was performed, and patient medications and                            allergies were reviewed. The patient's tolerance of                            previous anesthesia was also reviewed. The risks                            and benefits of the procedure and the sedation                            options and risks were discussed with the patient.                            All questions were answered, and informed consent                            was obtained. Prior Anticoagulants: The patient has                            taken no anticoagulant or antiplatelet agents. ASA                            Grade Assessment: III - A patient with severe  systemic disease. After reviewing the risks and                            benefits, the patient was deemed in satisfactory                            condition to undergo the procedure.                           After obtaining informed consent, the endoscope was                            passed under direct vision.  Throughout the                            procedure, the patient's blood pressure, pulse, and                            oxygen saturations were monitored continuously. The                            [id number]-HQ190L ([id number]) Olympus peds colonscope was                            introduced through the mouth and advanced to the                            small bowel at the Ligament of [place]. The [id number]-[id number]                            ([id number]) Olympus endoscope was introduced through                            the and advanced to the. The small bowel                            enteroscopy was accomplished without difficulty.                            The patient tolerated the procedure. Scope In: Scope Out: Findings:      No gross lesions were noted in the entire esophagus.      The Z-line was regular and was found 38 cm from the incisors.      A 3 cm hiatal hernia was present.      Striped moderately erythematous mucosa without bleeding was found in the       gastric body and in the gastric antrum. This has previously been       biopsied and negative for H. pylori so it was not rebiopsied.      Six small angiodysplastic lesions with no bleeding were found in the       cardia and in the gastric body. Fulguration to ablate the lesions to       prevent bleeding by argon plasma (gastric settings) was successful.      A single angiodysplastic lesion with no  bleeding was found in the       duodenal bulb. Fulguration to ablate the lesion to prevent bleeding by       argon plasma (right colon settings) was successful.      A single 7 mm sessile polyp was found in the duodenal bulb. The polyp       was removed with a cold snare. Resection and retrieval were complete.      Normal mucosa was found in the rest of the visualized duodenum.      Two angiodysplastic lesions with 1 with stigmata of recent bleeding and       another without bleeding were found in the proximal jejunum. Fulguration       to  ablate the lesions to prevent bleeding by argon plasma (right colon       settings) was successful. To prevent bleeding post-intervention, one       hemostatic clip was successfully placed (MR conditional) on the lesion       that showed stigmata of recent bleeding. Clip manufacturer: [employer]. There was no bleeding during, or at the end, of the       procedure.      Normal mucosa was found in the rest of the visualized proximal jejunum.       Area was tattooed with an injection of Spot (carbon black) to demarcate       distal extent of today's procedure. Impression:               - No gross lesions in the entire esophagus. Z-line                            regular, 38 cm from the incisors.                           - 3 cm hiatal hernia.                           - Erythematous mucosa in the gastric body and                            antrum. Previously biopsied so not redone today.                           - Six non-bleeding angiodysplastic lesions in the                            stomach. Treated with argon plasma coagulation                            (APC).                           - A single non-bleeding angiodysplastic lesion in                            the duodenum bulb. Treated with argon plasma                            coagulation (APC).                           -  A single duodenal polyp in the bulb. Resected and                            retrieved.                           - Normal mucosa was found in the rest of the                            visualized duodenum.                           - Two angiodysplastic lesions in the proximal                            jejunum (1 with recent stigmata and another that                            was nonbleeding). Treated with argon plasma                            coagulation (APC). Clip (MR conditional) was placed                            on the lesion that had had recent bleeding.                           -  Normal mucosa was found in the rest of the                            visualized proximal jejunum. Tattooed distal extent.                           -Digital rectal exam performed and she has evidence                            of dark brown/black stool suggestive of recent GI                            bleeding, this may persist for up to a week. Recommendation:           - The patient will be observed post-procedure,                            until all discharge criteria are met.                           - Return patient to hospital ward for ongoing care.                           - Advance diet as tolerated.                           - Increase PPI to 40 mg twice daily for 1  month                            then may decrease back to once daily.                           - Carafate twice daily for 2 weeks then may stop.                           - This patient may be at risk of having other                            AVMs/angiodysplasia as that we have not visualized                            in the small bowel now that she has progressed to                            dialysis needs. If the patient has recurring iron                            deficiency anemia, recommend consideration of                            repeat enteroscopy +/- video capsule endoscopy or                            if patient is just having anemia potentially just                            video capsule endoscopy.                           - Recommend IV iron and Epogen as per nephrology                            service.                           - Recommend oral iron once daily a week after                            discharge.                           - The inpatient GI service will sign off at this                            time, please call if questions arise.                           - The findings and recommendations were discussed                            with  the patient.                            - The findings and recommendations were discussed                            with the referring physician. Procedure Code(s):        --- Professional ---                           [phone], 59, Small intestinal endoscopy, enteroscopy                            beyond second portion of duodenum, not including                            ileum; with control of bleeding (eg, injection,                            bipolar cautery, unipolar cautery, laser, heater                            probe, stapler, plasma coagulator)                           44364, Small intestinal endoscopy, enteroscopy                            beyond second portion of duodenum, not including                            ileum; with removal of tumor(s), polyp(s), or other                            lesion(s) by snare technique                           44799, Unlisted procedure, small intestine Diagnosis Code(s):        --- Professional ---                           K44.9, Diaphragmatic hernia without obstruction or                            gangrene                           K31.89, Other diseases of stomach and duodenum                           K31.811, Angiodysplasia of stomach and duodenum                            with bleeding                           K31.7, Polyp of  stomach and duodenum                           D64.9, Anemia, unspecified                           K92.1, Melena (includes Hematochezia)                           K92.2, Gastrointestinal hemorrhage, unspecified CPT copyright 2022 [employer]. All rights reserved. The codes documented in this report are preliminary and upon coder review may  be revised to meet current compliance requirements. Corliss Parish, MD 08/16/2022 8:48:09 AM Number of Addenda: 0

## 2022-08-16 NOTE — Progress Notes (Signed)
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  Palliative care now involved-- pt is DNR now-  exploring GOC. She is quite anxious today -  started crying when I told her we were going to do dialysis today   Objective Vital signs in last 24 hours: Vitals:   08/16/22 0841 08/16/22 0851 08/16/22 0900 08/16/22 0921  BP: (!) 162/70 (!) 164/67 (!) 165/66 100/68  Pulse: 83 80 78 80  Resp: (!) 21 12 17 20   Temp:    98 F (36.7 C)  TempSrc:    Oral  SpO2: 96% 95% 92% 91%  Weight:      Height:       Weight change: -2.342 kg  Intake/Output Summary (Last 24 hours) at 08/16/2022 1046 Last data filed at 08/16/2022 1610 Gross per 24 hour  Intake 680 ml  Output 900 ml  Net -220 ml    Assessment/Plan: 62 year old WF with history of substance abuse, HTN, hep C, PAD< COPD and poor social situation with progressive CKD-  2 hosps in the past 2 weeks , really failure to thrive 1.Renal- progressive CKD really unknown etiology but also incomplete CKD care due to social situation-  progressing to ESRD.   2 hosps in the past 2 weeks-  cannot maintain her hgb or volume status with albumin of 2.1 and many physical sxms.  I told her that I thought it would be in her best interest to start dialysis " I am ready"  Appreciate VVS to place AVG and [facility] 5/29 followed by HD -  plan for second HD today.  It has been a bit of a rocky start- will cont to see how it goes-  see if she has any clarity regarding her situation.  Renal navigator on board to arrange OP HD 2. Hypertension/volume  - overloaded-  is making some urine-  will continue diuretics and UF as able with HD  3. Metabolic acidosis-  due to CKD- on bicarb-  dialysis will correct - will stop bicarb soon  4. Anemia  - severe and recurrent-  CKD is a factor but may be GI bleeding as well now scope confirms AVMs-   giving esa and iron-  low but stable 5. Bones-  calc low, phos high-  will check PTH  ( pending) added phoslo  6.  Dispo-  has not been an easy transition-  anxiety is playing a role-  continue with discussions per palliative care      [name]    Labs: Basic Metabolic Panel: Recent Labs  Lab 08/14/22 0139 08/15/22 0104 08/16/22 0036  NA 136 134* 136  K 4.5 3.9 4.4  CL 106 100 101  CO2 17* 23 23  GLUCOSE 104* 92 109*  BUN 64* 35* 41*  CREATININE 4.56* 3.16* 3.60*  CALCIUM 7.7* 7.8* 8.2*  PHOS  --  4.2 4.1   Liver Function Tests: Recent Labs  Lab 08/12/22 1708 08/13/22 0039 08/15/22 0104 08/16/22 0036  AST 16 16  --   --   ALT 11 10  --   --   ALKPHOS 77 73  --   --   BILITOT 0.6 0.4  --   --   PROT 7.1 6.7  --   --   ALBUMIN 2.2* 2.1* 2.0* 2.1*   No results for input(s): "LIPASE", "AMYLASE" in the last 168 hours. No results for input(s): "AMMONIA" in the last 168 hours. CBC: Recent Labs  Lab 08/12/22 1708 08/13/22 0039 08/13/22 1546 08/14/22 0139 08/15/22 0104 08/16/22 0036  WBC 7.3 6.5  --  8.1 6.9 9.2  NEUTROABS 5.0  --   --   --   --   --   HGB 7.0* 6.6*   < > 7.4* 7.3* 8.3*  HCT 22.0* 20.4*   < > 23.1* 23.4* 26.3*  MCV 97.3 92.3  --  90.2 91.4 92.9  PLT 371 363  --  378 329 360   < > = values in this interval not displayed.   Cardiac Enzymes: No results for input(s): "CKTOTAL", "CKMB", "CKMBINDEX", "TROPONINI" in the last 168 hours. CBG: Recent Labs  Lab 08/14/22 1250  GLUCAP 92    Iron Studies:  No results for input(s): "IRON", "TIBC", "TRANSFERRIN", "FERRITIN" in the last 72 hours.  Studies/Results: DG Chest Port 1 View  Result Date: 08/15/2022 CLINICAL DATA:  Hypoxia.  Cough and fever. EXAM: PORTABLE CHEST 1 VIEW COMPARISON:  Aug 14, 2022. FINDINGS: Diffuse interstitial and patchy airspace opacities. No visible pneumothorax. Hazy bibasilar opacities likely are in part due to layering pleural effusions. Enlarged cardiac silhouette. Pulmonary vascular congestion. Right IJ approach central venous catheter with the tip projecting at the  superior right atrium. IMPRESSION: 1. Cardiomegaly, pulmonary vascular congestion diffuse interstitial airspace opacities, probable layering pleural effusions, suspicious for pulmonary edema. Findings appear mildly worsened compared to May 29, 24. 2. Superimposed infection is not excluded. Electronically Signed   By: Feliberto Harts M.D.   On: 08/15/2022 11:20   DG CHEST PORT 1 VIEW  Result Date: 08/14/2022 CLINICAL DATA:  Status post dialysis catheter insertion. EXAM: PORTABLE CHEST 1 VIEW COMPARISON:  08/12/2022 FINDINGS: Right jugular dialysis catheter has been placed. The catheter tip is in the upper SVC region. Negative for pneumothorax. Patchy airspace densities in both lungs are concerning for pulmonary edema. Increased densities in the retrocardiac space could represent pleural fluid or atelectasis. Heart size is mildly enlarged. IMPRESSION: 1. Placement of right jugular dialysis catheter with the catheter tip in the SVC region. Negative for pneumothorax. 2. Patchy airspace densities in both lungs are concerning for pulmonary edema. 3. Increased densities in the retrocardiac space could represent pleural fluid or atelectasis. Electronically Signed   By: Richarda Overlie M.D.   On: 08/14/2022 13:39   DG C-Arm 1-60 Min-No Report  Result Date: 08/14/2022 Fluoroscopy was utilized by the requesting physician.  No radiographic interpretation.   Medications: Infusions:  cefTRIAXone (ROCEPHIN)  IV Stopped (08/15/22 1452)   ferric gluconate (FERRLECIT) IVPB 250 mg (08/15/22 0856)    Scheduled Medications:  arformoterol  15 mcg Nebulization BID   azithromycin  500 mg Oral Daily   budesonide (PULMICORT) nebulizer solution  0.25 mg Nebulization BID   busPIRone  10 mg Oral BID   calcium acetate  667 mg Oral TID WC   Chlorhexidine Gluconate Cloth  6 each Topical Q0600   Chlorhexidine Gluconate Cloth  6 each Topical Q0600   darbepoetin (ARANESP) injection - NON-DIALYSIS  200 mcg Subcutaneous Q Tue-1800    ferrous sulfate  325 mg Oral Q breakfast   furosemide  80 mg Intravenous BID   gabapentin  100 mg Oral TID   liver oil-zinc oxide   Topical BID   methylPREDNISolone (SOLU-MEDROL) injection  80 mg Intravenous Q24H   pantoprazole  40 mg Oral BID AC   revefenacin  175 mcg Nebulization Daily   sodium bicarbonate  1,300 mg Oral TID   sodium chloride flush  3 mL Intravenous  Q12H   sucralfate  1 g Oral BID    have reviewed scheduled and prn medications.  Physical Exam: General: coughing-  anxious/crying Heart: RRR Lungs: CBS and dec air movement  Abdomen: soft, non tender  Extremities: pitting edema still Dialysis Access:  [facility] and also right AVF-  can hear bruit    08/16/2022,10:46 AM  LOS: 3 days

## 2022-08-17 ENCOUNTER — Inpatient Hospital Stay (HOSPITAL_COMMUNITY): Payer: Medicare Other

## 2022-08-17 DIAGNOSIS — J90 Pleural effusion, not elsewhere classified: Secondary | ICD-10-CM

## 2022-08-17 LAB — STREP PNEUMONIAE URINARY ANTIGEN: Strep Pneumo Urinary Antigen: POSITIVE — AB

## 2022-08-17 LAB — RENAL FUNCTION PANEL
Albumin: 1.9 g/dL — ABNORMAL LOW (ref 3.5–5.0)
Anion gap: 10 (ref 5–15)
BUN: 26 mg/dL — ABNORMAL HIGH (ref 8–23)
CO2: 25 mmol/L (ref 22–32)
Calcium: 8 mg/dL — ABNORMAL LOW (ref 8.9–10.3)
Chloride: 101 mmol/L (ref 98–111)
Creatinine, Ser: 2.57 mg/dL — ABNORMAL HIGH (ref 0.44–1.00)
GFR, Estimated: 21 mL/min — ABNORMAL LOW (ref >60)
Glucose, Bld: 105 mg/dL — ABNORMAL HIGH (ref 70–99)
Phosphorus: 3.1 mg/dL (ref 2.5–4.6)
Potassium: 3.9 mmol/L (ref 3.5–5.1)
Sodium: 136 mmol/L (ref 135–145)

## 2022-08-17 LAB — CULTURE, BLOOD (ROUTINE X 2)

## 2022-08-17 LAB — CBC
HCT: 26.2 % — ABNORMAL LOW (ref 36.0–46.0)
Hemoglobin: 8.5 g/dL — ABNORMAL LOW (ref 12.0–15.0)
MCH: 30.4 pg (ref 26.0–34.0)
MCHC: 32.4 g/dL (ref 30.0–36.0)
MCV: 93.6 fL (ref 80.0–100.0)
Platelets: 360 10*3/uL (ref 150–400)
RBC: 2.8 MIL/uL — ABNORMAL LOW (ref 3.87–5.11)
RDW: 19 % — ABNORMAL HIGH (ref 11.5–15.5)
WBC: 9 10*3/uL (ref 4.0–10.5)
nRBC: 0.8 % — ABNORMAL HIGH (ref 0.0–0.2)

## 2022-08-17 LAB — URINE CULTURE: Culture: >=100000 — AB

## 2022-08-17 MED ORDER — CHLORHEXIDINE GLUCONATE CLOTH 2 % EX PADS
6.0000 | MEDICATED_PAD | Freq: Every day | CUTANEOUS | Status: DC
  Administered 2022-08-17 – 2022-08-20 (×3): 6 via TOPICAL

## 2022-08-17 MED ORDER — BUSPIRONE HCL 5 MG PO TABS
15.0000 mg | ORAL_TABLET | Freq: Two times a day (BID) | ORAL | Status: DC
  Administered 2022-08-17 – 2022-08-20 (×6): 15 mg via ORAL
  Filled 2022-08-17 (×6): qty 1

## 2022-08-17 MED ORDER — CALCITRIOL 0.25 MCG PO CAPS
0.2500 ug | ORAL_CAPSULE | ORAL | Status: DC
  Administered 2022-08-19: 0.25 ug via ORAL
  Filled 2022-08-17: qty 1

## 2022-08-17 MED ORDER — LEVOFLOXACIN 500 MG PO TABS
500.0000 mg | ORAL_TABLET | Freq: Once | ORAL | Status: AC
  Administered 2022-08-17: 500 mg via ORAL
  Filled 2022-08-17: qty 1

## 2022-08-17 MED ORDER — LEVOFLOXACIN 250 MG PO TABS
250.0000 mg | ORAL_TABLET | ORAL | Status: AC
  Administered 2022-08-19: 250 mg via ORAL
  Filled 2022-08-17: qty 1

## 2022-08-17 MED ORDER — LEVOFLOXACIN 250 MG PO TABS
250.0000 mg | ORAL_TABLET | Freq: Every day | ORAL | Status: DC
  Filled 2022-08-17: qty 1

## 2022-08-17 MED ORDER — PREDNISONE 20 MG PO TABS
40.0000 mg | ORAL_TABLET | Freq: Every day | ORAL | Status: DC
  Administered 2022-08-17 – 2022-08-19 (×3): 40 mg via ORAL
  Filled 2022-08-17 (×3): qty 2

## 2022-08-17 MED ORDER — OXYCODONE HCL 5 MG PO TABS
5.0000 mg | ORAL_TABLET | Freq: Four times a day (QID) | ORAL | Status: DC | PRN
  Administered 2022-08-17 – 2022-08-20 (×9): 5 mg via ORAL
  Filled 2022-08-17 (×9): qty 1

## 2022-08-17 NOTE — Progress Notes (Signed)
Subjective:   Had dialysis yesterday -  removed 1500 but no BP drop -  still with high o2 req -  HD went a little better yest but when I told her we would do on Monday she cried   Objective Vital signs in last 24 hours: Vitals:   08/16/22 1731 08/16/22 1942 08/16/22 2335 08/17/22 0400  BP: (!) 168/78 (!) 180/75 (!) 151/62 (!) 179/88  Pulse: 93 84 73 77  Resp: 18 18 17 20   Temp: 97.9 F (36.6 C) 97.8 F (36.6 C) 97.8 F (36.6 C) (!) 97.5 F (36.4 C)  TempSrc: Axillary Oral Oral Oral  SpO2: 91% 96% 94% 90%  Weight:    61.6 kg  Height:       Weight change: -0.058 kg  Intake/Output Summary (Last 24 hours) at 08/17/2022 0824 Last data filed at 08/17/2022 0408 Gross per 24 hour  Intake 1544.01 ml  Output 1880 ml  Net -335.99 ml    Assessment/Plan: 62 year old WF with history of substance abuse, HTN, hep C, PAD,  COPD and poor social situation with progressive CKD-  2 hosps in the past 2 weeks , really failure to thrive 1.Renal- progressive CKD really unknown etiology but also incomplete CKD care due to social situation-  progressing to ESRD.   2 hosps in the past 2 weeks-  cannot maintain her hgb or volume status with albumin of 2.1 and many physical sxms.  I told her that I thought it would be in her best interest to start dialysis " I am ready"  Appreciate VVS to place AVG and [facility] 5/29 followed by HD -  second HD 5/31.  It has been a bit of a rocky start- will cont to see how it goes-  see if she has any clarity regarding her situation.  Renal navigator on board to arrange OP HD.  She has had a very strong emotional reaction to starting HD-  I encouraged her to give it a real try and if after a few weeks she decides she really can't do it the decision is up to her.  Plan for treatment #3 on Monday 2. Hypertension/volume  - overloaded-  is making some urine-  will continue diuretics and UF as able with HD -  still have a ways to go 3. Metabolic acidosis-  due to CKD- on bicarb-  dialysis will  correct - will stop bicarb  4. Anemia  - severe and recurrent-  CKD is a factor but may be GI bleeding as well now scope confirms AVMs-   giving esa and iron-  improving slowly  5. Bones-  calc low, phos high-  PTH  349-  adding calcitriol to phoslo-  phos lowish will watch  6. Dispo-  has not been an easy transition-  anxiety is playing a role-  continue with discussions per palliative care-  is now DNR      [name]    Labs: Basic Metabolic Panel: Recent Labs  Lab 08/15/22 0104 08/16/22 0036 08/17/22 0044  NA 134* 136 136  K 3.9 4.4 3.9  CL 100 101 101  CO2 23 23 25   GLUCOSE 92 109* 105*  BUN 35* 41* 26*  CREATININE 3.16* 3.60* 2.57*  CALCIUM 7.8*  7.9* 8.2* 8.0*  PHOS 4.2 4.1 3.1   Liver Function Tests: Recent Labs  Lab 08/12/22 1708 08/13/22 0039 08/15/22 0104 08/16/22 0036 08/17/22 0044  AST 16 16  --   --   --  ALT 11 10  --   --   --   ALKPHOS 77 73  --   --   --   BILITOT 0.6 0.4  --   --   --   PROT 7.1 6.7  --   --   --   ALBUMIN 2.2* 2.1* 2.0* 2.1* 1.9*   No results for input(s): "LIPASE", "AMYLASE" in the last 168 hours. No results for input(s): "AMMONIA" in the last 168 hours. CBC: Recent Labs  Lab 08/12/22 1708 08/13/22 0039 08/13/22 1546 08/14/22 0139 08/15/22 0104 08/16/22 0036 08/17/22 0044  WBC 7.3 6.5  --  8.1 6.9 9.2 9.0  NEUTROABS 5.0  --   --   --   --   --   --   HGB 7.0* 6.6*   < > 7.4* 7.3* 8.3* 8.5*  HCT 22.0* 20.4*   < > 23.1* 23.4* 26.3* 26.2*  MCV 97.3 92.3  --  90.2 91.4 92.9 93.6  PLT 371 363  --  378 329 360 360   < > = values in this interval not displayed.   Cardiac Enzymes: No results for input(s): "CKTOTAL", "CKMB", "CKMBINDEX", "TROPONINI" in the last 168 hours. CBG: Recent Labs  Lab 08/14/22 1250  GLUCAP 92    Iron Studies:  No results for input(s): "IRON", "TIBC", "TRANSFERRIN", "FERRITIN" in the last 72 hours.  Studies/Results: DG Chest Port 1 View  Result Date: 08/15/2022 CLINICAL  DATA:  Hypoxia.  Cough and fever. EXAM: PORTABLE CHEST 1 VIEW COMPARISON:  Aug 14, 2022. FINDINGS: Diffuse interstitial and patchy airspace opacities. No visible pneumothorax. Hazy bibasilar opacities likely are in part due to layering pleural effusions. Enlarged cardiac silhouette. Pulmonary vascular congestion. Right IJ approach central venous catheter with the tip projecting at the superior right atrium. IMPRESSION: 1. Cardiomegaly, pulmonary vascular congestion diffuse interstitial airspace opacities, probable layering pleural effusions, suspicious for pulmonary edema. Findings appear mildly worsened compared to May 29, 24. 2. Superimposed infection is not excluded. Electronically Signed   By: Feliberto Harts M.D.   On: 08/15/2022 11:20   Medications: Infusions:  sodium chloride Stopped (08/16/22 2143)   cefTRIAXone (ROCEPHIN)  IV Stopped (08/16/22 1849)    Scheduled Medications:  arformoterol  15 mcg Nebulization BID   azithromycin  500 mg Oral Daily   budesonide (PULMICORT) nebulizer solution  0.25 mg Nebulization BID   busPIRone  10 mg Oral BID   calcium acetate  667 mg Oral TID WC   Chlorhexidine Gluconate Cloth  6 each Topical Q0600   Chlorhexidine Gluconate Cloth  6 each Topical Q0600   darbepoetin (ARANESP) injection - NON-DIALYSIS  200 mcg Subcutaneous Q Tue-1800   ferrous sulfate  325 mg Oral Q breakfast   furosemide  80 mg Intravenous BID   gabapentin  100 mg Oral TID   liver oil-zinc oxide   Topical BID   pantoprazole  40 mg Oral BID AC   predniSONE  40 mg Oral Q breakfast   revefenacin  175 mcg Nebulization Daily   sodium bicarbonate  1,300 mg Oral TID   sodium chloride flush  3 mL Intravenous Q12H   sucralfate  1 g Oral BID    have reviewed scheduled and prn medications.  Physical Exam: General: coughing-  anxious Heart: RRR Lungs: CBS and dec air movement  Abdomen: soft, non tender  Extremities: pitting edema still Dialysis Access:  [facility] and also right AVF-  can  hear bruit    08/17/2022,8:24 AM  LOS: 4 days

## 2022-08-17 NOTE — Progress Notes (Signed)
PROGRESS NOTE [name]  ZOX:[id number] DOB: Apr 22, 1960 DOA: 08/12/2022 PCP: [name], MD  Brief Narrative/Hospital Course: 62 year old female with complex medical comorbidities with PAD GERD gastritis, hypertension, anemia, CKD 5, COPD, hepatitis C, Hyperlipidemia, recent admission [phone] for symptomatic anemia due to chronic GI bleeding needing 2 units PRBC, FOBT was negative, presenting with fluid retention shortness of breath, in the triage initially hypoxic 80%, noted to have anasarca in legs arms and abdomen. In the ED mildly tachycardic, he did not with nasal cannula, BP on higher side 160s to 170s mild tachypnea-24 Labs creatinine elevated 4.19 hemoglobin 7.1 g> 7.0 g, lactic acid 2.1> 0.8, BNP elevated 1084. Chest x-ray cardiomegaly and pulmonary edema with diffuse bilateral interstitial pulmonary opacities right greater than left small pleural effusion. Patient admitted working diagnosis of acute hypoxic aspiratory failure pretension in the setting of CHF exacerbation possible COPD exacerbation. Her baseline weight went has gone up but she is note sure how much. HH overnight at 6.6 g> received 1 unit PRBC transfusion 5/28. Closely followed by nephrology, given the fluid overload underwent TDC placement and dialysis started. She had been having worsening hypoxia ever since her [facility] placement imaging with fluid overload, pulmonary critical care has been consulted 5/30.  Patient still has been updated palliative care was also consulted 5/30 patient and daughter requestied PMT to change to DNR S/p SBE:5/31: 9 AVMs noted. 1 with evidence of recent stigmata of bleeding.  GI advised to continue PPI and outpatient follow-up    Subjective: Seen and examined Overnight afebrile BP stable on 8 L nasal cannula C/O pain all over     Labs fairly stable with anemia Nursing and found patient lost IV access and could not give IV Lasix. Seen and examined this morning About to have her  meal She is not in distress although she is an 8 L high flow nasal cannula she denies any shortness of breath Overnight patient has been afebrile. Underwent EGD showing 9 AVMs 5/31   Assessment and Plan: Principal Problem:   Acute on chronic diastolic CHF (congestive heart failure) (HCC) Active Problems:   Chronic hepatitis C without hepatic coma (HCC)   HLD (hyperlipidemia)   Hypertension   Chronic leg pain   Anxiety and depression   Gastroesophageal reflux disease   IDA (iron deficiency anemia)   Chronic obstructive pulmonary disease (HCC)   Polysubstance abuse (HCC)   Gastritis and gastroduodenitis   PUD (peptic ulcer disease)   CKD (chronic kidney disease) stage 5, GFR less than 15 ml/min (HCC)   Acute respiratory failure with hypoxia (HCC)   Heme positive stool   Acute on chronic anemia   Hypoxia   Acute on chronic congestive heart failure (HCC)  Acute hypoxic respiratory failure Acute pulmonary edema Acute on chronic diastolic CHF and fluid overload due to ESRD: multifactorial due to volume overload secondary to stage V CKD now progressed to ESRD and also with acute on chronic diastolic CHF, COPD.  Concern for pneumonia antibiotic coverage added 5/30 per PCCM Continue to adjust fluid with dialysis, Lasix per nephrology. Continue supplemental oxygen, still on HFNC at 8 L wean as tolerated to maintain saturation at least 90% or above. Continue systemic steroids bronchodilators incentive spirometry PT OT, nebs per pulmonary  Progressive CKD stage V-now progressing to ESRD: Now on dialysis nephrology following TDC placed and first HD 5/29. 2nd HD for 5/31. Patient has had very strong emotional reaction to starting HD nephrology has advised to give it a try for  a few weeks before further decision.  Treatment #3 plan for Monday. Net IO Since Admission: -1,328.91 mL [08/17/22 1022]   Metabolic acidosis in the setting of CKD/ESRD, improved,monitor  Acute on chronic diastolic  CHF and fluid overload:cxr with pulmonary edema and bilateral small effusions and cardiomegaly BNP elevated 1084Echo from 1223 EF 55-60% with G1 DD on Lasix previously but was held due to AKI/dehydration.  On Lasix per nephrology.   Suspected [facility] pneumonia Acute COPD exacerbation Current active smoker: Continue on prednisone, Brovana Pulmicort bronchodilators appreciate pulmonary input on board .  Continue pneumonia coverage but given ESBL Klebsiella changing Rocephin/azithromycin to Levaquin  ESBL Klebsiella UTI patient having some symptoms of dysuria, changing antibiotics to levofloxacin  Anemia multifactorial likely from anemia of chronic kidney disease PUD/gastritis/GERD and diverticulosis FOBT+ Acute blood loss anemia due to GI bleeding from AVM s/p 1 unit PRBC -anemia panel done recently with low iron storage ON EPO/ IV Iron per nephro. GI consulted-S/p SBE:5/31: 9 AVMs noted. 1 with evidence of recent stigmata of bleeding.  GI advised to continue PPI and outpatient follow-up.  Monitor and transfuse hemoglobin if less than 7 g Recent Labs  Lab 08/13/22 1546 08/14/22 0139 08/15/22 0104 08/16/22 0036 08/17/22 0044  HGB 7.4* 7.4* 7.3* 8.3* 8.5*  HCT 23.0* 23.1* 23.4* 26.3* 26.2*   HTN:BP on higher side allow high bp for fluid removal/HD and IV Lasix, holding home amlodipine Substance ZOX:WRUEAVW currently using marijuana only. Depression/Anxiety:Mood stable ,continue home BuSpar and trazodone Neuropathy:Continue home Gabapentin, she also takes Percocet 1 every 8 hours at home.   GOC: Palliative care consultation has been appreciated they had discussed with patient and the daughter, she has been changed to DNR, ongoing further discussion about GOC   DVT prophylaxis: Place and maintain sequential compression device Start: 08/13/22 1130 Code Status:   Code Status: DNR Family Communication: plan of care discussed with patient at bedside. I called her daughter [name] and [name]  ( significant other- friend) 5/29 no answer.  I called her other daughter  [name] [phone]  and upated 5/30, they arrived at the bedside to have a discussion with palliative care 5/30.  Patient status is: Inpatient because of ESRD Level of care: Telemetry Cardiac   Dispo: The patient is from: HOME            Anticipated disposition: TBD Objective: Vitals last 24 hrs: Vitals:   08/16/22 1731 08/16/22 1942 08/16/22 2335 08/17/22 0400  BP: (!) 168/78 (!) 180/75 (!) 151/62 (!) 179/88  Pulse: 93 84 73 77  Resp: 18 18 17 20   Temp: 97.9 F (36.6 C) 97.8 F (36.6 C) 97.8 F (36.6 C) (!) 97.5 F (36.4 C)  TempSrc: Axillary Oral Oral Oral  SpO2: 91% 96% 94% 90%  Weight:    61.6 kg  Height:       Weight change: -0.058 kg  Physical Examination: General exam: AAox3, weak,older appearing HEENT:Oral mucosa moist, Ear/Nose WNL grossly, dentition normal. Respiratory system: bilaterally diminished BS, no use of accessory muscle, chest TDC+ Cardiovascular system: S1 & S2 +, regular rate. Gastrointestinal system: Abdomen soft, NT,ND,BS+ Nervous System:Alert, awake, moving extremities and grossly nonfocal Extremities: LE ankle edema MILD, lower extremities warm Skin: No rashes,no icterus. MSK: Normal muscle bulk,tone, power   Medications reviewed:  Scheduled Meds:  arformoterol  15 mcg Nebulization BID   azithromycin  500 mg Oral Daily   budesonide (PULMICORT) nebulizer solution  0.25 mg Nebulization BID   busPIRone  10 mg Oral BID   [  START ON 08/19/2022] calcitRIOL  0.25 mcg Oral Q M,W,F-HD   calcium acetate  667 mg Oral TID WC   Chlorhexidine Gluconate Cloth  6 each Topical Q0600   darbepoetin (ARANESP) injection - NON-DIALYSIS  200 mcg Subcutaneous Q Tue-1800   ferrous sulfate  325 mg Oral Q breakfast   furosemide  80 mg Intravenous BID   gabapentin  100 mg Oral TID   liver oil-zinc oxide   Topical BID   pantoprazole  40 mg Oral BID AC   predniSONE  40 mg Oral Q breakfast    revefenacin  175 mcg Nebulization Daily   sodium chloride flush  3 mL Intravenous Q12H   sucralfate  1 g Oral BID   Continuous Infusions:  sodium chloride Stopped (08/16/22 2143)   cefTRIAXone (ROCEPHIN)  IV Stopped (08/16/22 1849)    Diet Order             Diet renal/carb modified with fluid restriction Diet-HS Snack? Nothing; Fluid restriction: 1500 mL Fluid; Room service appropriate? Yes; Fluid consistency: Thin  Diet effective now                   Intake/Output Summary (Last 24 hours) at 08/17/2022 1022 Last data filed at 08/17/2022 0408 Gross per 24 hour  Intake 1544.01 ml  Output 1880 ml  Net -335.99 ml   Net IO Since Admission: -1,328.91 mL [08/17/22 1022]  Wt Readings from Last 3 Encounters:  08/17/22 61.6 kg  08/04/22 57 kg  06/07/22 51.3 kg     Unresulted Labs (From admission, onward)     Start     Ordered   08/15/22 1314  Strep pneumoniae urinary antigen  Once,   R        08/15/22 1313   08/15/22 1314  Legionella Pneumophila Serogp 1 Ur Ag  Once,   R        08/15/22 1313   08/15/22 1300  Expectorated Sputum Assessment w Gram Stain, Rflx to Resp Cult  Once,   R        08/15/22 1259   08/15/22 0500  Renal function panel  Daily,   R      08/14/22 0845   08/14/22 0500  CBC  Daily,   R      08/13/22 0938   08/12/22 1708  Urinalysis, w/ Reflex to Culture (Infection Suspected) -Urine, Clean Catch  Once,   URGENT       Question:  Specimen Source  Answer:  Urine, Clean Catch   08/12/22 1707   Signed and Held  Renal function panel  Once,   R       Question:  Specimen collection method  Answer:  Lab=Lab collect   Signed and Held   Signed and Held  CBC  Once,   R       Question:  Specimen collection method  Answer:  Lab=Lab collect   Signed and Held          Data Reviewed: I have personally reviewed following labs and imaging studies CBC: Recent Labs  Lab 08/12/22 1708 08/13/22 0039 08/13/22 1546 08/14/22 0139 08/15/22 0104 08/16/22 0036  08/17/22 0044  WBC 7.3 6.5  --  8.1 6.9 9.2 9.0  NEUTROABS 5.0  --   --   --   --   --   --   HGB 7.0* 6.6* 7.4* 7.4* 7.3* 8.3* 8.5*  HCT 22.0* 20.4* 23.0* 23.1* 23.4* 26.3* 26.2*  MCV 97.3 92.3  --  90.2 91.4 92.9 93.6  PLT 371 363  --  378 329 360 360   Basic Metabolic Panel: Recent Labs  Lab 08/12/22 2140 08/13/22 0039 08/14/22 0139 08/15/22 0104 08/16/22 0036 08/17/22 0044  NA  --  132* 136 134* 136 136  K  --  4.0 4.5 3.9 4.4 3.9  CL  --  102 106 100 101 101  CO2  --  15* 17* 23 23 25   GLUCOSE  --  149* 104* 92 109* 105*  BUN  --  60* 64* 35* 41* 26*  CREATININE  --  4.78* 4.56* 3.16* 3.60* 2.57*  CALCIUM  --  7.6* 7.7* 7.8*  7.9* 8.2* 8.0*  MG 1.9  --   --   --   --   --   PHOS  --   --   --  4.2 4.1 3.1  GFR: Recent Labs  Lab 08/12/22 1708 08/13/22 0039 08/15/22 0104 08/16/22 0036 08/17/22 0044  AST 16 16  --   --   --   ALT 11 10  --   --   --   ALKPHOS 77 73  --   --   --   BILITOT 0.6 0.4  --   --   --   PROT 7.1 6.7  --   --   --   ALBUMIN 2.2* 2.1* 2.0* 2.1* 1.9*   Recent Labs  Lab 08/12/22 2140  INR 1.1   Recent Labs  Lab 08/14/22 1250  GLUCAP 92   Recent Labs  Lab 08/12/22 1720 08/12/22 2140 08/15/22 0058 08/16/22 0036  PROCALCITON  --   --  0.38 0.38  LATICACIDVEN 2.1* 0.8  --   --    Recent Results (from the past 240 hour(s))  Culture, blood (Routine x 2)     Status: None   Collection Time: 08/12/22  5:08 PM   Specimen: BLOOD RIGHT ARM  Result Value Ref Range Status   Specimen Description BLOOD RIGHT ARM  Final   Special Requests   Final    BOTTLES DRAWN AEROBIC AND ANAEROBIC Blood Culture results may not be optimal due to an inadequate volume of blood received in culture bottles   Culture   Final    NO GROWTH 5 DAYS Performed at [facility], [address], [place], [place] [address]    Report Status 08/17/2022 FINAL  Final  Culture, blood (Routine x 2)     Status: None   Collection Time: 08/12/22  5:13 PM    Specimen: BLOOD  Result Value Ref Range Status   Specimen Description BLOOD LEFT ANTECUBITAL  Final   Special Requests   Final    BOTTLES DRAWN AEROBIC AND ANAEROBIC Blood Culture adequate volume   Culture   Final    NO GROWTH 5 DAYS Performed at [facility], [address], [place], [place] [address]    Report Status 08/17/2022 FINAL  Final  Urine Culture     Status: Abnormal   Collection Time: 08/15/22  3:03 PM   Specimen: Urine, Random  Result Value Ref Range Status   Specimen Description URINE, RANDOM  Final   Special Requests NONE Reflexed from [id number]  Final   Culture (A)  Final    >=100,000 COLONIES/mL KLEBSIELLA PNEUMONIAE Confirmed Extended Spectrum Beta-Lactamase Producer (ESBL).  In bloodstream infections from ESBL organisms, carbapenems are preferred over piperacillin/tazobactam. They are shown to have a lower risk of mortality. MULTI-DRUG RESISTANT ORGANISM CRITICAL RESULT CALLED TO,  READ BACK BY AND VERIFIED WITH: RN LAMEVA Brooke Dare [id number] BY Berline Chough, [place] Performed at [facility], [address], [place], [place] [address]    Report Status 08/17/2022 FINAL  Final   Organism ID, Bacteria KLEBSIELLA PNEUMONIAE (A)  Final      Susceptibility   Klebsiella pneumoniae - MIC*    AMPICILLIN >=32 RESISTANT Resistant     CEFAZOLIN >=64 RESISTANT Resistant     CEFEPIME >=32 RESISTANT Resistant     CEFTRIAXONE >=64 RESISTANT Resistant     CIPROFLOXACIN <=0.25 SENSITIVE Sensitive     GENTAMICIN >=16 RESISTANT Resistant     IMIPENEM <=0.25 SENSITIVE Sensitive     NITROFURANTOIN 128 RESISTANT Resistant     TRIMETH/SULFA >=320 RESISTANT Resistant     AMPICILLIN/SULBACTAM >=32 RESISTANT Resistant     PIP/TAZO <=4 SENSITIVE Sensitive     * >=100,000 COLONIES/mL KLEBSIELLA PNEUMONIAE  Respiratory (~20 pathogens) panel by PCR     Status: None   Collection Time: 08/15/22  5:25 PM   Specimen: Nasopharyngeal Swab; Respiratory  Result Value Ref Range Status   Adenovirus  NOT DETECTED NOT DETECTED Final   Coronavirus 229E NOT DETECTED NOT DETECTED Final    Comment: (NOTE) The Coronavirus on the Respiratory Panel, DOES NOT test for the novel  Coronavirus (2019 nCoV)    Coronavirus HKU1 NOT DETECTED NOT DETECTED Final   Coronavirus NL63 NOT DETECTED NOT DETECTED Final   Coronavirus OC43 NOT DETECTED NOT DETECTED Final   Metapneumovirus NOT DETECTED NOT DETECTED Final   Rhinovirus / Enterovirus NOT DETECTED NOT DETECTED Final   Influenza A NOT DETECTED NOT DETECTED Final   Influenza B NOT DETECTED NOT DETECTED Final   Parainfluenza Virus 1 NOT DETECTED NOT DETECTED Final   Parainfluenza Virus 2 NOT DETECTED NOT DETECTED Final   Parainfluenza Virus 3 NOT DETECTED NOT DETECTED Final   Parainfluenza Virus 4 NOT DETECTED NOT DETECTED Final   Respiratory Syncytial Virus NOT DETECTED NOT DETECTED Final   Bordetella pertussis NOT DETECTED NOT DETECTED Final   Bordetella Parapertussis NOT DETECTED NOT DETECTED Final   Chlamydophila pneumoniae NOT DETECTED NOT DETECTED Final   Mycoplasma pneumoniae NOT DETECTED NOT DETECTED Final    Comment: Performed at [facility], [address], [place], [place] [address]  SARS Coronavirus 2 by RT PCR (hospital order, performed in [facility] hospital lab) *cepheid single result test* Anterior Nasal Swab     Status: None   Collection Time: 08/15/22  5:25 PM   Specimen: Anterior Nasal Swab  Result Value Ref Range Status   SARS Coronavirus 2 by RT PCR NEGATIVE NEGATIVE Final    Comment: Performed at [facility], [address], [place], [place] [address]    Antimicrobials: Anti-infectives (From admission, onward)    Start     Dose/Rate Route Frequency Ordered Stop   08/15/22 1415  cefTRIAXone (ROCEPHIN) 1 g in sodium chloride 0.9 % 100 mL IVPB        1 g 200 mL/hr over 30 Minutes Intravenous Every 24 hours 08/15/22 1315     08/15/22 1415  azithromycin (ZITHROMAX) 500 mg in sodium chloride 0.9 % 250 mL IVPB   Status:  Discontinued        500 mg 250 mL/hr over 60 Minutes Intravenous Every 24 hours 08/15/22 1315 08/15/22 1322   08/15/22 1415  azithromycin (ZITHROMAX) tablet 500 mg        500 mg Oral Daily 08/15/22 1322  08/14/22 0600  cefUROXime (ZINACEF) 1.5 g in sodium chloride 0.9 % 100 mL IVPB        1.5 g 200 mL/hr over 30 Minutes Intravenous On call to O.R. 08/13/22 1243 08/14/22 1335     Culture/Microbiology    Component Value Date/Time   SDES URINE, RANDOM 08/15/2022 1503   SPECREQUEST NONE Reflexed from [id number] 08/15/2022 1503   CULT (A) 08/15/2022 1503    >=100,000 COLONIES/mL KLEBSIELLA PNEUMONIAE Confirmed Extended Spectrum Beta-Lactamase Producer (ESBL).  In bloodstream infections from ESBL organisms, carbapenems are preferred over piperacillin/tazobactam. They are shown to have a lower risk of mortality. MULTI-DRUG RESISTANT ORGANISM CRITICAL RESULT CALLED TO, READ BACK BY AND VERIFIED WITH: RN [facility] Brooke Dare [phone] BY Berline Chough, MT Performed at [facility], [address], [place], [place] [address]    REPTSTATUS 08/17/2022 FINAL 08/15/2022 1503   Radiology Studies: [facility] Chest Port 1 View  Result Date: 08/17/2022 CLINICAL DATA:  Acute on chronic congestive heart failure. Pulmonary edema. EXAM: PORTABLE CHEST 1 VIEW COMPARISON:  Prior chest x-ray 08/15/2022 FINDINGS: Right IJ tunneled hemodialysis catheter in good position. The catheter tip overlies the mid SVC. Moderate cardiomegaly. Pulmonary vascular congestion with diffuse interstitial prominence and peripheral Kerley B-lines consistent with pulmonary edema. Small to moderate bilateral layering pleural effusions. No pneumothorax. No acute osseous abnormality. IMPRESSION: Moderate CHF with small to moderate bilateral layering pleural effusions. Well-positioned tunneled right IJ hemodialysis catheter. Electronically Signed   By: Malachy Moan M.D.   On: 08/17/2022 09:31   DG Chest Port 1 View  Result Date:  08/15/2022 CLINICAL DATA:  Hypoxia.  Cough and fever. EXAM: PORTABLE CHEST 1 VIEW COMPARISON:  Aug 14, 2022. FINDINGS: Diffuse interstitial and patchy airspace opacities. No visible pneumothorax. Hazy bibasilar opacities likely are in part due to layering pleural effusions. Enlarged cardiac silhouette. Pulmonary vascular congestion. Right IJ approach central venous catheter with the tip projecting at the superior right atrium. IMPRESSION: 1. Cardiomegaly, pulmonary vascular congestion diffuse interstitial airspace opacities, probable layering pleural effusions, suspicious for pulmonary edema. Findings appear mildly worsened compared to May 29, 24. 2. Superimposed infection is not excluded. Electronically Signed   By: Feliberto Harts M.D.   On: 08/15/2022 11:20    LOS: 4 days  Lanae Boast, MD  [facility] 08/17/2022, 10:22 AM

## 2022-08-17 NOTE — Progress Notes (Signed)
NAMEROYETTA ERSPAMER, MRN:  [id number], DOB:  [birth year], LOS: 3 ADMISSION DATE:  08/12/2022, CONSULTATION DATE:  08/16/22 REFERRING MD:  Dayna Barker, CHIEF COMPLAINT:  hypoxia, SOB   History of Present Illness:  62 y.o. F with PMH significant for CKD stage IV, HFpEF, polysubstance abuse, tobacco use and COPD not on CPAP or home O2 who presented to the ED 5/28 with complaints of shortness of breath and LE swelling.  She had recently been admitted 5/16-5/20 for chronic GIB and iron deficiency anemia and discharged back to motel where she is living.  She required HFNC and CXR showed pulmonary edema.  She has had some productive cough and is still smoking (since age 59) about 0.5ppd. BNP was elevated to 1084.   Pt was admitted to [facility] and treated with lasix, steroids and nebulizers as she was wheezing on exam.   Nephrology was consulted and noted overall failure to thrive and CKD now progressing to ESRD and R IJ tunneled dialysis catheter was placed with creation of RUE AV fistula creation on 5/29.   First HD session the same with 1L removed, she also had 1700cc UOP.  On 5/30 she remained on HFNC 10L with wheezing, therefore PCCM consulted for further recommendations for hypoxia.  Pt up eating and in no distress, she says her breathing is "fine" and would like not to be bothered.   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   Significant Hospital Events: Including procedures, antibiotic start and stop dates in addition to other pertinent events   5/28 admitted to [facility] 5/29  started HD 5/30 remains on HFNC, PCCM consult, added steroids and BDs. Abx started.  Had her first round of hemodialysis 5/31 down to 8 lpm still has cough   Interim History / Subjective:  Multiple complaints. Still sob   Objective   Blood pressure 100/68, pulse 80, temperature 98 F (36.7 C), temperature source Oral, resp. rate 20, height 5\' 7"  (1.702 m), weight 63.5 kg, SpO2 91 %.        Intake/Output Summary (Last 24 hours) at 08/16/2022 1045 Last data filed at 08/16/2022 6962 Gross per 24 hour  Intake 680 ml  Output 900 ml  Net -220 ml   Filed Weights   08/14/22 1815 08/15/22 0604 08/16/22 0500  Weight: 64.7 kg 70.3 kg 63.5 kg     Resolved Hospital Problem list   General chronically ill appearing 62 year old female  HENT NCAT  Pulm coarse rhonchi  Portable chest x-ray personally reviewed shows bilateral airspace disease with probable element of layering effusion Card rrr Abd soft Ext warm multiple areas of ecchymosis  Neuro intact  Assessment & Plan:  Acute Hypoxic Respiratory Failure due to volume overload from ESRD HFpEF mild AECOPD baseline COPD and tobacco use OSA  Stage V CKD/ESRD Acute blood loss anemia 2/2 bleeding AVMs (discovered via EGD 5/30)   Acute Hypoxic Respiratory Failure due to volume overload from ESRD, HFpEF w/ mild AECOPD (baseline COPD and tobacco use)  Discussion Still w/ sig O2 need.   Plan Continuing volume removal with intermittent dialysis & diuresis  Continue scheduled bronchodilators Initiate prednisone taper start at 40mg /d Day #3  azithromycin and ceftriaxone Repeat CXR 6/3 if effusions not clearing we should consider [place] eval and possible thora  Wean oxygen Pulse oximetry    Best Practice (right click and "Reselect all SmartList Selections" daily)   Per primary team  Simonne Martinet ACNP-BC [facility] Pulmonary/Critical Care Pager # [phone] OR # [phone] if no answer

## 2022-08-17 NOTE — Progress Notes (Signed)
Good Morning pt has not received her Iv  lasix. lost access over night. Iv team unable to gain access this morning due to patient tensing and screaming. will try again later.

## 2022-08-17 NOTE — Progress Notes (Signed)
Palliative Medicine Progress Note   Patient Name: [name]       Date: 08/17/2022 DOB: 10/22/1960  Age: 62 y.o. MRN#: [id number] Attending Physician: Lanae Boast, MD Primary Care Physician: [name], MD Admit Date: 08/12/2022  Reason for Consultation/Follow-up: {Reason for Consult:23484}  HPI/Patient Profile: 62 y.o. female  with complex medical history of PAD, CKD stage V, COPD, hepatitis C, GERD, gastritis, anemia, HTN, HLD, and recent admission 08/01/2022 through 08/05/2022 for symptomatic anemia due to chronic GI bleeding.  She presented back to the ED on 08/12/2022 with shortness of breath. She was admitted with acute hypoxic respiratory failure due to volume overload in the setting of CKD stage 5 (now progressed to ESRD) and also acute on chronic CHF. Hemodialysis was initiated on 5/29.   Palliative Medicine has been consulted for goals of care discussions.   Subjective: Chart reviewed. Patient underwent small bowel endoscopy yesterday showing 9 AVMs, 1 with recent stigmata of bleeding. 2nd HD session was also yesterday 5/31.     Objective:  Physical Exam          Vital Signs: BP (!) 179/88 (BP Location: Left Wrist)   Pulse 77   Temp (!) 97.5 F (36.4 C) (Oral)   Resp 20   Ht 5\' 7"  (1.702 m)   Wt 61.6 kg   SpO2 94%   BMI 21.27 kg/m  SpO2: SpO2: 94 % O2 Device: O2 Device: High Flow Nasal Cannula (salter) O2 Flow Rate: O2 Flow Rate (L/min): 6 L/min  Intake/output summary:  Intake/Output Summary (Last 24 hours) at 08/17/2022 1159 Last data filed at 08/17/2022 0408 Gross per 24 hour  Intake 1301.01 ml  Output 1880 ml  Net -578.99 ml    LBM: Last BM Date : 08/14/22     Palliative Assessment/Data: ***     Palliative Medicine Assessment & Plan    Assessment: Principal Problem:   Acute on chronic diastolic CHF (congestive heart failure) (HCC) Active Problems:   Hypertension   Chronic hepatitis C without hepatic coma (HCC)   Chronic leg pain   Anxiety and depression   Gastroesophageal reflux disease   IDA (iron deficiency anemia)   Chronic obstructive pulmonary disease (HCC)   Polysubstance abuse (HCC)   Gastritis and gastroduodenitis   PUD (peptic ulcer disease)   HLD (hyperlipidemia)  CKD (chronic kidney disease) stage 5, GFR less than 15 ml/min (HCC)   Acute respiratory failure with hypoxia (HCC)   Heme positive stool   Acute on chronic anemia   Hypoxia   Acute on chronic congestive heart failure (HCC)    Recommendations/Plan: DNR/DNI  Goals of Care and Additional Recommendations: Limitations on Scope of Treatment: {Recommended Scope and Preferences:21019}  Code Status:   Prognosis:  {Palliative Care Prognosis:23504}  Discharge Planning: {Palliative dispostion:23505}  Care plan was discussed with ***  Thank you for allowing the Palliative Medicine Team to assist in the care of this patient.   ***   Merry Proud, NP   Please contact Palliative Medicine Team phone at [phone] for questions and concerns.  For individual providers, please see AMION.

## 2022-08-18 ENCOUNTER — Inpatient Hospital Stay (HOSPITAL_COMMUNITY): Payer: Medicare Other

## 2022-08-18 LAB — RENAL FUNCTION PANEL
Albumin: 1.9 g/dL — ABNORMAL LOW (ref 3.5–5.0)
Anion gap: 10 (ref 5–15)
BUN: 37 mg/dL — ABNORMAL HIGH (ref 8–23)
CO2: 24 mmol/L (ref 22–32)
Calcium: 8.1 mg/dL — ABNORMAL LOW (ref 8.9–10.3)
Chloride: 101 mmol/L (ref 98–111)
Creatinine, Ser: 3.01 mg/dL — ABNORMAL HIGH (ref 0.44–1.00)
GFR, Estimated: 17 mL/min — ABNORMAL LOW (ref >60)
Glucose, Bld: 106 mg/dL — ABNORMAL HIGH (ref 70–99)
Phosphorus: 2.8 mg/dL (ref 2.5–4.6)
Potassium: 4.1 mmol/L (ref 3.5–5.1)
Sodium: 135 mmol/L (ref 135–145)

## 2022-08-18 LAB — CBC
HCT: 26.9 % — ABNORMAL LOW (ref 36.0–46.0)
Hemoglobin: 8.4 g/dL — ABNORMAL LOW (ref 12.0–15.0)
MCH: 29.4 pg (ref 26.0–34.0)
MCHC: 31.2 g/dL (ref 30.0–36.0)
MCV: 94.1 fL (ref 80.0–100.0)
Platelets: 377 10*3/uL (ref 150–400)
RBC: 2.86 MIL/uL — ABNORMAL LOW (ref 3.87–5.11)
RDW: 19.7 % — ABNORMAL HIGH (ref 11.5–15.5)
WBC: 9.8 10*3/uL (ref 4.0–10.5)
nRBC: 1 % — ABNORMAL HIGH (ref 0.0–0.2)

## 2022-08-18 MED ORDER — HYDROMORPHONE HCL 1 MG/ML IJ SOLN
1.0000 mg | Freq: Once | INTRAMUSCULAR | Status: AC
  Administered 2022-08-18: 1 mg via INTRAVENOUS
  Filled 2022-08-18: qty 1

## 2022-08-18 NOTE — Progress Notes (Addendum)
Subjective:     still with high o2 req -  still making urine on high dose lasix-  c/p pain in chest but I think is TDC related-    Objective Vital signs in last 24 hours: Vitals:   08/18/22 0000 08/18/22 0430 08/18/22 0829 08/18/22 0844  BP: (!) 156/75 (!) 156/73 (!) 159/65   Pulse: 80 67 73   Resp: 19 20 19 18   Temp: 97.7 F (36.5 C) 98.7 F (37.1 C) 97.8 F (36.6 C)   TempSrc: Oral Oral Oral   SpO2: 96% 97% 97%   Weight:    64.4 kg  Height:       Weight change:   Intake/Output Summary (Last 24 hours) at 08/18/2022 0846 Last data filed at 08/17/2022 1700 Gross per 24 hour  Intake 240 ml  Output 450 ml  Net -210 ml    Assessment/Plan: 62 year old WF with history of substance abuse, HTN, hep C, PAD,  COPD and poor social situation with progressive CKD-  2 hosps in the past 2 weeks , really failure to thrive 1.Renal- progressive CKD really unknown etiology but also incomplete CKD care due to social situation-  progressing to ESRD.   2 hosps in the past 2 weeks-  cannot maintain her hgb or volume status with albumin of 2.1 and many physical sxms.  I told her that I thought it would be in her best interest to start dialysis " I am ready"  Appreciate VVS to place AVG and [facility] 5/29 followed by HD -  second HD 5/31.  It has been a bit of a rocky start- will cont to see how it goes-  see if she has any clarity regarding her situation.  Renal navigator on board to arrange OP HD.  She has had a very strong emotional reaction to starting HD-  I encouraged her to give it a real try and if after a few weeks she decides she really can't do it the decision is up to her.  Plan for treatment #3 on Monday 2. Hypertension/volume  - overloaded-  is making some urine-  will continue diuretics and UF as able with HD -  still have a ways to go 3. Metabolic acidosis-  due to CKD- on bicarb-  dialysis will correct - stop bicarb  4. Anemia  - severe and recurrent-  CKD is a factor but may be GI bleeding as well now  scope confirms AVMs-   giving esa and iron-  improving slowly  5. Bones-  calc low, phos high-  PTH  349-  adding calcitriol to phoslo-  phos lowish will watch  6. Dispo-  has not been an easy transition-  anxiety is playing a role-  continue with discussions per palliative care-  is now DNR 7. UTI-  on levaquin       Jackqulyn Mendel Ardyth Harps    Labs: Basic Metabolic Panel: Recent Labs  Lab 08/16/22 0036 08/17/22 0044 08/18/22 0105  NA 136 136 135  K 4.4 3.9 4.1  CL 101 101 101  CO2 23 25 24   GLUCOSE 109* 105* 106*  BUN 41* 26* 37*  CREATININE 3.60* 2.57* 3.01*  CALCIUM 8.2* 8.0* 8.1*  PHOS 4.1 3.1 2.8   Liver Function Tests: Recent Labs  Lab 08/12/22 1708 08/13/22 0039 08/15/22 0104 08/16/22 0036 08/17/22 0044 08/18/22 0105  AST 16 16  --   --   --   --   ALT 11 10  --   --   --   --  ALKPHOS 77 73  --   --   --   --   BILITOT 0.6 0.4  --   --   --   --   PROT 7.1 6.7  --   --   --   --   ALBUMIN 2.2* 2.1*   < > 2.1* 1.9* 1.9*   < > = values in this interval not displayed.   No results for input(s): "LIPASE", "AMYLASE" in the last 168 hours. No results for input(s): "AMMONIA" in the last 168 hours. CBC: Recent Labs  Lab 08/12/22 1708 08/13/22 0039 08/14/22 0139 08/15/22 0104 08/16/22 0036 08/17/22 0044 08/18/22 0105  WBC 7.3   < > 8.1 6.9 9.2 9.0 9.8  NEUTROABS 5.0  --   --   --   --   --   --   HGB 7.0*   < > 7.4* 7.3* 8.3* 8.5* 8.4*  HCT 22.0*   < > 23.1* 23.4* 26.3* 26.2* 26.9*  MCV 97.3   < > 90.2 91.4 92.9 93.6 94.1  PLT 371   < > 378 329 360 360 377   < > = values in this interval not displayed.   Cardiac Enzymes: No results for input(s): "CKTOTAL", "CKMB", "CKMBINDEX", "TROPONINI" in the last 168 hours. CBG: Recent Labs  Lab 08/14/22 1250  GLUCAP 92    Iron Studies:  No results for input(s): "IRON", "TIBC", "TRANSFERRIN", "FERRITIN" in the last 72 hours.  Studies/Results: DG Chest Port 1 View  Result Date: 08/17/2022 CLINICAL DATA:   Acute on chronic congestive heart failure. Pulmonary edema. EXAM: PORTABLE CHEST 1 VIEW COMPARISON:  Prior chest x-ray 08/15/2022 FINDINGS: Right IJ tunneled hemodialysis catheter in good position. The catheter tip overlies the mid SVC. Moderate cardiomegaly. Pulmonary vascular congestion with diffuse interstitial prominence and peripheral Kerley B-lines consistent with pulmonary edema. Small to moderate bilateral layering pleural effusions. No pneumothorax. No acute osseous abnormality. IMPRESSION: Moderate CHF with small to moderate bilateral layering pleural effusions. Well-positioned tunneled right IJ hemodialysis catheter. Electronically Signed   By: Malachy Moan M.D.   On: 08/17/2022 09:31   Medications: Infusions:  sodium chloride Stopped (08/16/22 2143)    Scheduled Medications:  arformoterol  15 mcg Nebulization BID   budesonide (PULMICORT) nebulizer solution  0.25 mg Nebulization BID   busPIRone  15 mg Oral BID   [START ON 08/19/2022] calcitRIOL  0.25 mcg Oral Q M,W,F-HD   calcium acetate  667 mg Oral TID WC   Chlorhexidine Gluconate Cloth  6 each Topical Q0600   darbepoetin (ARANESP) injection - NON-DIALYSIS  200 mcg Subcutaneous Q Tue-1800   ferrous sulfate  325 mg Oral Q breakfast   furosemide  80 mg Intravenous BID   gabapentin  100 mg Oral TID   [START ON 08/19/2022] levofloxacin  250 mg Oral Q48H   liver oil-zinc oxide   Topical BID   pantoprazole  40 mg Oral BID AC   predniSONE  40 mg Oral Q breakfast   revefenacin  175 mcg Nebulization Daily   sodium chloride flush  3 mL Intravenous Q12H   sucralfate  1 g Oral BID    have reviewed scheduled and prn medications.  Physical Exam: General: coughing-  anxious Heart: RRR Lungs: CBS and dec air movement  Abdomen: soft, non tender  Extremities: pitting edema still Dialysis Access:  [facility] and also right AVF-  can hear bruit    08/18/2022,8:46 AM  LOS: 5 days

## 2022-08-18 NOTE — Plan of Care (Signed)
  Problem: Elimination: Goal: Will not experience complications related to bowel motility Outcome: Progressing   Problem: Elimination: Goal: Will not experience complications related to bowel motility Outcome: Progressing   

## 2022-08-18 NOTE — Progress Notes (Signed)
PROGRESS NOTE [name]  ZOX:[id number] DOB: 05-30-1960 DOA: 08/12/2022 PCP: Cynthia Matar, MD  Brief Narrative/Hospital Course: 62 year old female with complex medical comorbidities with PAD GERD gastritis, hypertension, anemia, CKD 5, COPD, hepatitis C, Hyperlipidemia, recent admission [phone] for symptomatic anemia due to chronic GI bleeding needing 2 units PRBC, FOBT was negative, presenting with fluid retention shortness of breath, in the triage initially hypoxic 80%, noted to have anasarca in legs arms and abdomen. In the ED mildly tachycardic, he did not with nasal cannula, BP on higher side 160s to 170s mild tachypnea-24 Labs creatinine elevated 4.19 hemoglobin 7.1 g> 7.0 g, lactic acid 2.1> 0.8, BNP elevated 1084. Chest x-ray cardiomegaly and pulmonary edema with diffuse bilateral interstitial pulmonary opacities right greater than left small pleural effusion. Patient admitted working diagnosis of acute hypoxic aspiratory failure pretension in the setting of CHF exacerbation possible COPD exacerbation. Her baseline weight went has gone up but she is note sure how much. HH overnight at 6.6 g> received 1 unit PRBC transfusion 5/28. Closely followed by nephrology, given the fluid overload underwent TDC placement and dialysis started. She had been having worsening hypoxia ever since her [facility] placement imaging with fluid overload, pulmonary critical care has been consulted 5/30.  Patient still has been updated palliative care was also consulted 5/30 patient and daughter requestied PMT to change to DNR S/p SBE:5/31: 9 AVMs noted. 1 with evidence of recent stigmata of bleeding.  GI advised to continue PPI and outpatient follow-up    Subjective: Patient seen and examined. Overall she feels better but still complains of pain all over Oxygen is being weaned down currently at 4 L this morning on 6 L Labs reviewed no major changes LMN remains low 1.9 bicarb/potassium hemoglobin stable She is  wondering when she can go home  Assessment and Plan: Principal Problem:   Acute on chronic diastolic CHF (congestive heart failure) (HCC) Active Problems:   Chronic hepatitis C without hepatic coma (HCC)   HLD (hyperlipidemia)   Hypertension   Chronic leg pain   Anxiety and depression   Gastroesophageal reflux disease   IDA (iron deficiency anemia)   Chronic obstructive pulmonary disease (HCC)   Polysubstance abuse (HCC)   Gastritis and gastroduodenitis   PUD (peptic ulcer disease)   CKD (chronic kidney disease) stage 5, GFR less than 15 ml/min (HCC)   Acute respiratory failure with hypoxia (HCC)   Heme positive stool   Acute on chronic anemia   Hypoxia   Acute on chronic congestive heart failure (HCC)  Acute hypoxic respiratory failure Acute pulmonary edema Acute on chronic diastolic CHF and fluid overload due to ESRD: multifactorial due to volume overload secondary to stage V CKD now progressed to ESRD and also with acute on chronic diastolic CHF, COPD.Concern for pneumonia antibiotic coverage added 5/30 per PCCM> antibiotic adjusted to levaquin 6/1 for UTI. On Lasix 80 mg twice daily, also getting dialysis.  Continue Brovana/Pulmicort/bronchodilators and wean supplemental oxygen as tolerated to maintain saturation at least 90%> hopefully can wean down to 2 L for dc home. Continue PT OT.  Progressive CKD stage V-now progressing to ESRD: Now on dialysis nephrology following TDC placed and first HD 5/29. 2nd HD for 5/31. Patient has had very strong emotional reaction to starting HD nephrology has advised to give it a try for a few weeks before further decision. Treatment #3 plan for Monday. Net IO Since Admission: -1,538.91 mL [08/18/22 1147]   Metabolic acidosis in the setting of CKD/ESRD, improved  Acute on chronic diastolic CHF and fluid overload: cxr with pulmonary edema and bilateral small effusions and cardiomegaly BNP elevated 1084Echo from 1223 EF 55-60% with G1 DD on  Lasix previously but was held due to AKI/dehydration. Cont Lasix per nephrology.   Suspected Community acquired pneumonia Acute COPD exacerbation Current active smoker Pleural effusion: Continue on prednisone, Brovana Pulmicort bronchodilators appreciate pulmonary input on board .  Continue pneumonia coverage but given ESBL Klebsiella changed Rocephin/azithromycin to Levaquin 6/1, continue supplemental oxygen and wean.  Repeat chest x-ray 6/3 per pulmonary and if effusions not clearing-?  Ultrasound eval/possible thoracentesis.  ESBL Klebsiella UTI patient having some symptoms of dysuria, changing antibiotics to levofloxacin  Anemia multifactorial likely from anemia of chronic kidney disease PUD/gastritis/GERD and diverticulosis FOBT+ Acute blood loss anemia due to GI bleeding from AVM s/p 1 unit PRBC -anemia panel done recently with low iron storage ON EPO/ IV Iron per nephro. GI consulted-S/p SBE:5/31: 9 AVMs noted. 1 with evidence of recent stigmata of bleeding.  GI advised to continue PPI and outpatient follow-up.  Hemoglobin remained stable above 8 g, monitor.  Recent Labs  Lab 08/14/22 0139 08/15/22 0104 08/16/22 0036 08/17/22 0044 08/18/22 0105  HGB 7.4* 7.3* 8.3* 8.5* 8.4*  HCT 23.1* 23.4* 26.3* 26.2* 26.9*   HTN:BP fairly controlled blood pressure for diuresis and HD.  Holding amlodipine.    Substance ZOX:WRUEAVW currently using marijuana only. Depression/Anxiety:Mood stable ,continue home BuSpar and trazodone Neuropathy:Continue home Gabapentin, she also takes Percocet 1 every 8 hours at home.   Debility deconditioning complex medical comorbidities/noncompliance: GOC: Palliative care consultation has been appreciated they had discussed with patient and the daughter, she has been changed to DNR, ongoing further discussion about GOC   DVT prophylaxis: Place and maintain sequential compression device Start: 08/13/22 1130 Code Status:   Code Status: DNR Family Communication:  plan of care discussed with patient at bedside. I called her daughter [name] and [name] ( significant other- friend) 5/29 no answer.  I called her other daughter  [name] [phone]  and upated 5/30, they arrived at the bedside to have a discussion with palliative care 5/30.  Patient status is: Inpatient because of ESRD Level of care: Telemetry Cardiac   Dispo: The patient is from: HOME            Anticipated disposition: TBD Objective: Vitals last 24 hrs: Vitals:   08/18/22 0829 08/18/22 0844 08/18/22 0926 08/18/22 1132  BP: (!) 159/65   (!) 174/82  Pulse: 73     Resp: 19 18    Temp: 97.8 F (36.6 C)     TempSrc: Oral     SpO2: 97%  100% 95%  Weight:  64.4 kg    Height:       Weight change:   Physical Examination: General exam: AA, weak,older appearing, chronically sick looking HEENT:Oral mucosa moist, Ear/Nose WNL grossly, dentition normal. Respiratory system: bilaterally diminished BS, no use of accessory muscle Cardiovascular system: S1 & S2 +, regular rate,chest TDC+. Gastrointestinal system: Abdomen soft,NT,ND,BS+ Nervous System:Alert, awake, moving extremities and grossly nonfocal Extremities: LE ankle edema mild with chronic scar  Skin: No rashes,no icterus. MSK: Normal muscle bulk,tone, power   Medications reviewed:  Scheduled Meds:  arformoterol  15 mcg Nebulization BID   budesonide (PULMICORT) nebulizer solution  0.25 mg Nebulization BID   busPIRone  15 mg Oral BID   [START ON 08/19/2022] calcitRIOL  0.25 mcg Oral Q M,W,F-HD   calcium acetate  667 mg Oral TID WC  Chlorhexidine Gluconate Cloth  6 each Topical Q0600   darbepoetin (ARANESP) injection - NON-DIALYSIS  200 mcg Subcutaneous Q Tue-1800   ferrous sulfate  325 mg Oral Q breakfast   furosemide  80 mg Intravenous BID   gabapentin  100 mg Oral TID   [START ON 08/19/2022] levofloxacin  250 mg Oral Q48H   liver oil-zinc oxide   Topical BID   pantoprazole  40 mg Oral BID AC   predniSONE  40 mg Oral Q breakfast    revefenacin  175 mcg Nebulization Daily   sodium chloride flush  3 mL Intravenous Q12H   sucralfate  1 g Oral BID  Continuous Infusions:  sodium chloride Stopped (08/16/22 2143)    Diet Order             Diet renal/carb modified with fluid restriction Diet-HS Snack? Nothing; Fluid restriction: 1500 mL Fluid; Room service appropriate? Yes; Fluid consistency: Thin  Diet effective now                  Intake/Output Summary (Last 24 hours) at 08/18/2022 1147 Last data filed at 08/17/2022 1700 Gross per 24 hour  Intake 240 ml  Output 450 ml  Net -210 ml  Net IO Since Admission: -1,538.91 mL [08/18/22 1147]  Wt Readings from Last 3 Encounters:  08/18/22 64.4 kg  08/04/22 57 kg  06/07/22 51.3 kg   Unresulted Labs (From admission, onward)     Start     Ordered   08/15/22 1314  Legionella Pneumophila Serogp 1 Ur Ag  Once,   R        08/15/22 1313   08/15/22 1300  Expectorated Sputum Assessment w Gram Stain, Rflx to Resp Cult  Once,   R        08/15/22 1259   08/15/22 0500  Renal function panel  Daily,   R      08/14/22 0845   08/12/22 1708  Urinalysis, w/ Reflex to Culture (Infection Suspected) -Urine, Clean Catch  Once,   URGENT       Question:  Specimen Source  Answer:  Urine, Clean Catch   08/12/22 1707   Signed and Held  Renal function panel  Once,   R       Question:  Specimen collection method  Answer:  Lab=Lab collect   Signed and Held   Signed and Held  CBC  Once,   R       Question:  Specimen collection method  Answer:  Lab=Lab collect   Signed and Held          Data Reviewed: I have personally reviewed following labs and imaging studies CBC: Recent Labs  Lab 08/12/22 1708 08/13/22 0039 08/14/22 0139 08/15/22 0104 08/16/22 0036 08/17/22 0044 08/18/22 0105  WBC 7.3   < > 8.1 6.9 9.2 9.0 9.8  NEUTROABS 5.0  --   --   --   --   --   --   HGB 7.0*   < > 7.4* 7.3* 8.3* 8.5* 8.4*  HCT 22.0*   < > 23.1* 23.4* 26.3* 26.2* 26.9*  MCV 97.3   < > 90.2 91.4 92.9  93.6 94.1  PLT 371   < > 378 329 360 360 377   < > = values in this interval not displayed.  Basic Metabolic Panel: Recent Labs  Lab 08/12/22 2140 08/13/22 0039 08/14/22 0139 08/15/22 0104 08/16/22 0036 08/17/22 0044 08/18/22 0105  NA  --    < >  136 134* 136 136 135  K  --    < > 4.5 3.9 4.4 3.9 4.1  CL  --    < > 106 100 101 101 101  CO2  --    < > 17* 23 23 25 24   GLUCOSE  --    < > 104* 92 109* 105* 106*  BUN  --    < > 64* 35* 41* 26* 37*  CREATININE  --    < > 4.56* 3.16* 3.60* 2.57* 3.01*  CALCIUM  --    < > 7.7* 7.8*  7.9* 8.2* 8.0* 8.1*  MG 1.9  --   --   --   --   --   --   PHOS  --   --   --  4.2 4.1 3.1 2.8   < > = values in this interval not displayed.  GFR: Recent Labs  Lab 08/12/22 1708 08/13/22 0039 08/15/22 0104 08/16/22 0036 08/17/22 0044 08/18/22 0105  AST 16 16  --   --   --   --   ALT 11 10  --   --   --   --   ALKPHOS 77 73  --   --   --   --   BILITOT 0.6 0.4  --   --   --   --   PROT 7.1 6.7  --   --   --   --   ALBUMIN 2.2* 2.1* 2.0* 2.1* 1.9* 1.9*   Recent Labs  Lab 08/12/22 2140  INR 1.1   Recent Labs  Lab 08/14/22 1250  GLUCAP 92   Recent Labs  Lab 08/12/22 1720 08/12/22 2140 08/15/22 0058 08/16/22 0036  PROCALCITON  --   --  0.38 0.38  LATICACIDVEN 2.1* 0.8  --   --    Recent Results (from the past 240 hour(s))  Culture, blood (Routine x 2)     Status: None   Collection Time: 08/12/22  5:08 PM   Specimen: BLOOD RIGHT ARM  Result Value Ref Range Status   Specimen Description BLOOD RIGHT ARM  Final   Special Requests   Final    BOTTLES DRAWN AEROBIC AND ANAEROBIC Blood Culture results may not be optimal due to an inadequate volume of blood received in culture bottles   Culture   Final    NO GROWTH 5 DAYS Performed at [facility], [address], [place], [place] [address]    Report Status 08/17/2022 FINAL  Final  Culture, blood (Routine x 2)     Status: None   Collection Time: 08/12/22  5:13 PM   Specimen:  BLOOD  Result Value Ref Range Status   Specimen Description BLOOD LEFT ANTECUBITAL  Final   Special Requests   Final    BOTTLES DRAWN AEROBIC AND ANAEROBIC Blood Culture adequate volume   Culture   Final    NO GROWTH 5 DAYS Performed at [facility], [address], [place], [place] [address]    Report Status 08/17/2022 FINAL  Final  Urine Culture     Status: Abnormal   Collection Time: 08/15/22  3:03 PM   Specimen: Urine, Random  Result Value Ref Range Status   Specimen Description URINE, RANDOM  Final   Special Requests NONE Reflexed from [id number]  Final   Culture (A)  Final    >=100,000 COLONIES/mL KLEBSIELLA PNEUMONIAE Confirmed Extended Spectrum Beta-Lactamase Producer (ESBL).  In bloodstream infections from ESBL organisms, carbapenems are  preferred over piperacillin/tazobactam. They are shown to have a lower risk of mortality. MULTI-DRUG RESISTANT ORGANISM CRITICAL RESULT CALLED TO, READ BACK BY AND VERIFIED WITH: RN [facility] Brooke Dare [phone] BY Berline Chough, [place] Performed at [facility], [address], [place], [place] [address]    Report Status 08/17/2022 FINAL  Final   Organism ID, Bacteria KLEBSIELLA PNEUMONIAE (A)  Final      Susceptibility   Klebsiella pneumoniae - MIC*    AMPICILLIN >=32 RESISTANT Resistant     CEFAZOLIN >=64 RESISTANT Resistant     CEFEPIME >=32 RESISTANT Resistant     CEFTRIAXONE >=64 RESISTANT Resistant     CIPROFLOXACIN <=0.25 SENSITIVE Sensitive     GENTAMICIN >=16 RESISTANT Resistant     IMIPENEM <=0.25 SENSITIVE Sensitive     NITROFURANTOIN 128 RESISTANT Resistant     TRIMETH/SULFA >=320 RESISTANT Resistant     AMPICILLIN/SULBACTAM >=32 RESISTANT Resistant     PIP/TAZO <=4 SENSITIVE Sensitive     * >=100,000 COLONIES/mL KLEBSIELLA PNEUMONIAE  Respiratory (~20 pathogens) panel by PCR     Status: None   Collection Time: 08/15/22  5:25 PM   Specimen: Nasopharyngeal Swab; Respiratory  Result Value Ref Range Status   Adenovirus NOT  DETECTED NOT DETECTED Final   Coronavirus 229E NOT DETECTED NOT DETECTED Final    Comment: (NOTE) The Coronavirus on the Respiratory Panel, DOES NOT test for the novel  Coronavirus (2019 nCoV)    Coronavirus HKU1 NOT DETECTED NOT DETECTED Final   Coronavirus NL63 NOT DETECTED NOT DETECTED Final   Coronavirus OC43 NOT DETECTED NOT DETECTED Final   Metapneumovirus NOT DETECTED NOT DETECTED Final   Rhinovirus / Enterovirus NOT DETECTED NOT DETECTED Final   Influenza A NOT DETECTED NOT DETECTED Final   Influenza B NOT DETECTED NOT DETECTED Final   Parainfluenza Virus 1 NOT DETECTED NOT DETECTED Final   Parainfluenza Virus 2 NOT DETECTED NOT DETECTED Final   Parainfluenza Virus 3 NOT DETECTED NOT DETECTED Final   Parainfluenza Virus 4 NOT DETECTED NOT DETECTED Final   Respiratory Syncytial Virus NOT DETECTED NOT DETECTED Final   Bordetella pertussis NOT DETECTED NOT DETECTED Final   Bordetella Parapertussis NOT DETECTED NOT DETECTED Final   Chlamydophila pneumoniae NOT DETECTED NOT DETECTED Final   Mycoplasma pneumoniae NOT DETECTED NOT DETECTED Final    Comment: Performed at [facility], [address] [place], [place] [address]  SARS Coronavirus 2 by RT PCR (hospital order, performed in [facility] hospital lab) *cepheid single result test* Anterior Nasal Swab     Status: None   Collection Time: 08/15/22  5:25 PM   Specimen: Anterior Nasal Swab  Result Value Ref Range Status   SARS Coronavirus 2 by RT PCR NEGATIVE NEGATIVE Final    Comment: Performed at [facility], [address], [place], [place] [address]    Antimicrobials: Anti-infectives (From admission, onward)    Start     Dose/Rate Route Frequency Ordered Stop   08/19/22 1800  levofloxacin (LEVAQUIN) tablet 250 mg       See Hyperspace for full Linked Orders Report.   250 mg Oral Every 48 hours 08/17/22 1124 08/21/22 1759   08/17/22 1200  levofloxacin (LEVAQUIN) tablet 250 mg  Status:  Discontinued        250  mg Oral Daily 08/17/22 1113 08/17/22 1123   08/17/22 1200  levofloxacin (LEVAQUIN) tablet 500 mg       See Hyperspace for full Linked Orders Report.  500 mg Oral  Once 08/17/22 1124 08/17/22 1537   08/15/22 1415  cefTRIAXone (ROCEPHIN) 1 g in sodium chloride 0.9 % 100 mL IVPB  Status:  Discontinued        1 g 200 mL/hr over 30 Minutes Intravenous Every 24 hours 08/15/22 1315 08/17/22 1113   08/15/22 1415  azithromycin (ZITHROMAX) 500 mg in sodium chloride 0.9 % 250 mL IVPB  Status:  Discontinued        500 mg 250 mL/hr over 60 Minutes Intravenous Every 24 hours 08/15/22 1315 08/15/22 1322   08/15/22 1415  azithromycin (ZITHROMAX) tablet 500 mg  Status:  Discontinued        500 mg Oral Daily 08/15/22 1322 08/17/22 1113   08/14/22 0600  cefUROXime (ZINACEF) 1.5 g in sodium chloride 0.9 % 100 mL IVPB        1.5 g 200 mL/hr over 30 Minutes Intravenous On call to O.R. 08/13/22 1243 08/14/22 1335     Culture/Microbiology    Component Value Date/Time   SDES URINE, RANDOM 08/15/2022 1503   SPECREQUEST NONE Reflexed from [id number] 08/15/2022 1503   CULT (A) 08/15/2022 1503    >=100,000 COLONIES/mL KLEBSIELLA PNEUMONIAE Confirmed Extended Spectrum Beta-Lactamase Producer (ESBL).  In bloodstream infections from ESBL organisms, carbapenems are preferred over piperacillin/tazobactam. They are shown to have a lower risk of mortality. MULTI-DRUG RESISTANT ORGANISM CRITICAL RESULT CALLED TO, READ BACK BY AND VERIFIED WITH: RN [facility] Brooke Dare [phone] BY Berline Chough, MT Performed at [facility], [address], [place], [place] [address]    REPTSTATUS 08/17/2022 FINAL 08/15/2022 1503   Radiology Studies: [facility] Chest Port 1 View  Result Date: 08/17/2022 CLINICAL DATA:  Acute on chronic congestive heart failure. Pulmonary edema. EXAM: PORTABLE CHEST 1 VIEW COMPARISON:  Prior chest x-ray 08/15/2022 FINDINGS: Right IJ tunneled hemodialysis catheter in good position. The catheter tip overlies the mid SVC.  Moderate cardiomegaly. Pulmonary vascular congestion with diffuse interstitial prominence and peripheral Kerley B-lines consistent with pulmonary edema. Small to moderate bilateral layering pleural effusions. No pneumothorax. No acute osseous abnormality. IMPRESSION: Moderate CHF with small to moderate bilateral layering pleural effusions. Well-positioned tunneled right IJ hemodialysis catheter. Electronically Signed   By: Malachy Moan M.D.   On: 08/17/2022 09:31    LOS: 5 days  Lanae Boast, MD  [facility] 08/18/2022, 11:47 AM

## 2022-08-19 ENCOUNTER — Encounter (HOSPITAL_COMMUNITY): Payer: Self-pay | Admitting: Gastroenterology

## 2022-08-19 ENCOUNTER — Inpatient Hospital Stay (HOSPITAL_COMMUNITY): Payer: Medicare Other

## 2022-08-19 ENCOUNTER — Encounter: Payer: Self-pay | Admitting: Gastroenterology

## 2022-08-19 DIAGNOSIS — J90 Pleural effusion, not elsewhere classified: Secondary | ICD-10-CM

## 2022-08-19 LAB — CBC
HCT: 29 % — ABNORMAL LOW (ref 36.0–46.0)
Hemoglobin: 8.8 g/dL — ABNORMAL LOW (ref 12.0–15.0)
MCH: 29.3 pg (ref 26.0–34.0)
MCHC: 30.3 g/dL (ref 30.0–36.0)
MCV: 96.7 fL (ref 80.0–100.0)
Platelets: 409 10*3/uL — ABNORMAL HIGH (ref 150–400)
RBC: 3 MIL/uL — ABNORMAL LOW (ref 3.87–5.11)
RDW: 20.2 % — ABNORMAL HIGH (ref 11.5–15.5)
WBC: 12 10*3/uL — ABNORMAL HIGH (ref 4.0–10.5)
nRBC: 1.5 % — ABNORMAL HIGH (ref 0.0–0.2)

## 2022-08-19 LAB — RENAL FUNCTION PANEL
Albumin: 2 g/dL — ABNORMAL LOW (ref 3.5–5.0)
Anion gap: 10 (ref 5–15)
BUN: 50 mg/dL — ABNORMAL HIGH (ref 8–23)
CO2: 24 mmol/L (ref 22–32)
Calcium: 8 mg/dL — ABNORMAL LOW (ref 8.9–10.3)
Chloride: 99 mmol/L (ref 98–111)
Creatinine, Ser: 3.51 mg/dL — ABNORMAL HIGH (ref 0.44–1.00)
GFR, Estimated: 14 mL/min — ABNORMAL LOW (ref >60)
Glucose, Bld: 111 mg/dL — ABNORMAL HIGH (ref 70–99)
Phosphorus: 2.3 mg/dL — ABNORMAL LOW (ref 2.5–4.6)
Potassium: 4 mmol/L (ref 3.5–5.1)
Sodium: 133 mmol/L — ABNORMAL LOW (ref 135–145)

## 2022-08-19 LAB — SURGICAL PATHOLOGY

## 2022-08-19 MED ORDER — HEPARIN SODIUM (PORCINE) 1000 UNIT/ML IJ SOLN
INTRAMUSCULAR | Status: AC
  Administered 2022-08-19: 3200 [IU]
  Filled 2022-08-19: qty 4

## 2022-08-19 MED ORDER — PREDNISONE 20 MG PO TABS
30.0000 mg | ORAL_TABLET | Freq: Every day | ORAL | Status: DC
  Administered 2022-08-20: 30 mg via ORAL
  Filled 2022-08-19: qty 1

## 2022-08-19 NOTE — Progress Notes (Signed)
PROGRESS NOTE [name]  ZOX:[id number] DOB: 1961/03/08 DOA: 08/12/2022 PCP: [name], MD  Brief Narrative/Hospital Course: 63 year old female with complex medical comorbidities with PAD GERD gastritis, hypertension, anemia, CKD 5, COPD, hepatitis C, Hyperlipidemia, recent admission [phone] for symptomatic anemia due to chronic GI bleeding needing 2 units PRBC, FOBT was negative, presenting with fluid retention shortness of breath, in the triage initially hypoxic 80%, noted to have anasarca in legs arms and abdomen. In the ED mildly tachycardic, he did not with nasal cannula, BP on higher side 160s to 170s mild tachypnea-24 Labs creatinine elevated 4.19 hemoglobin 7.1 g> 7.0 g, lactic acid 2.1> 0.8, BNP elevated 1084. Chest x-ray cardiomegaly and pulmonary edema with diffuse bilateral interstitial pulmonary opacities right greater than left small pleural effusion. Patient admitted working diagnosis of acute hypoxic aspiratory failure pretension in the setting of CHF exacerbation possible COPD exacerbation. Her baseline weight went has gone up but she is note sure how much. HH overnight at 6.6 g> received 1 unit PRBC transfusion 5/28. Closely followed by nephrology, given the fluid overload underwent TDC placement and dialysis started. She had been having worsening hypoxia ever since her [facility] placement imaging with fluid overload, pulmonary critical care has been consulted 5/30.  Patient still has been updated palliative care was also consulted 5/30 patient and daughter requestied PMT to change to DNR S/p SBE:5/31: 9 AVMs noted. 1 with evidence of recent stigmata of bleeding.  GI advised to continue PPI and outpatient follow-up    Subjective: Patient seen and examined in dialysis Resting comfortably No new complaint Overnight patient has been afebrile BP stable oxygen requirement down to 4 L Labs with BUN 50 creatinine 3.5  Assessment and Plan: Principal Problem:   Acute on  chronic diastolic CHF (congestive heart failure) (HCC) Active Problems:   Chronic hepatitis C without hepatic coma (HCC)   HLD (hyperlipidemia)   Hypertension   Chronic leg pain   Anxiety and depression   Gastroesophageal reflux disease   IDA (iron deficiency anemia)   Chronic obstructive pulmonary disease (HCC)   Polysubstance abuse (HCC)   Gastritis and gastroduodenitis   PUD (peptic ulcer disease)   CKD (chronic kidney disease) stage 5, GFR less than 15 ml/min (HCC)   Acute respiratory failure with hypoxia (HCC)   Heme positive stool   Acute on chronic anemia   Hypoxia   Acute on chronic congestive heart failure (HCC)  Acute hypoxic respiratory failure Acute pulmonary edema Acute on chronic diastolic CHF and fluid overload due to ESRD: COPD and concern for pneumonia: multifactorial due to volume overload secondary to stage V CKD now progressed to ESRD and also with acute on chronic diastolic CHF, CAP coverage added 5/30 per PCCM> antibiotic adjusted to levaquin 6/1 for UTI.  BNP elevated 1084-Echo showed EF 55 to 60% and G1 DD. Continue diuretics and HD. For HD TODAY.Continue Brovana/Pulmicort/bronchodilators and wean supplemental oxygen as tolerated to maintain saturation at least 90%> hopefully can wean down to 2 L for dc home. Continue PT OT.  Progressive CKD stage V-now progressing to ESRD: Now on HD,Nephrology following TDC placed and first HD 5/29. 2nd HD for 5/31. Patient has had very strong emotional reaction to starting HD nephrology has advised to give it a try for a few weeks before further decision. Treatment #3 planned for today. Net IO Since Admission: -2,658.91 mL [08/19/22 1105]   Metabolic acidosis in the setting of CKD/ESRD, resolved  Suspected Community acquired pneumonia Acute COPD exacerbation Current active smoker  Pleural effusion: Continue on prednisone, Brovana Pulmicort bronchodilators appreciate pulmonary input on board .  Continue pneumonia  coverage but given ESBL Klebsiella changed Rocephin/azithromycin to Levaquin 6/1, continue supplemental oxygen and wean.  Repeat chest x-ray 6/3 per pulmonary and if effusions not clearing-?Ultrasound eval/possible thoracentesis-follow-up chest x-ray pending, defer to PCCM.  ESBL Klebsiella UTI Cont levofloxacin  Anemia multifactorial likely from anemia of chronic kidney disease PUD/gastritis/GERD and diverticulosis FOBT+ Acute blood loss anemia due to GI bleeding from AVM s/p 1 unit PRBC -anemia panel done recently with low iron storage ON EPO/ IV Iron per nephro. GI consulted-S/p SBE:5/31: 9 AVMs noted. 1 with evidence of recent stigmata of bleeding.  Continue PPI and outpatient follow-up with GI.  GI signed off.   Recent Labs  Lab 08/15/22 0104 08/16/22 0036 08/17/22 0044 08/18/22 0105 08/19/22 0845  HGB 7.3* 8.3* 8.5* 8.4* 8.8*  HCT 23.4* 26.3* 26.2* 26.9* 29.0*   HTN:BP fairly controlled-allow higher bp to allow diuresis and HD.Holding amlodipine.    Substance GNF:AOZHYQM currently using marijuana only.  Depression/Anxiety:Mood stable ,continue home BuSpar and trazodone  Neuropathy:Continue home Gabapentin, she also takes Percocet 1 every 8 hours at home.   Debility deconditioning complex medical comorbidities/noncompliance: GOC: Palliative care consultation has been appreciated they had discussed with patient and the daughter, she has been changed to DNR, ongoing further discussion about GOC   DVT prophylaxis: Place and maintain sequential compression device Start: 08/13/22 1130 Code Status:   Code Status: DNR Family Communication: plan of care discussed with patient at bedside. I called her daughter [name] and [name] ( significant other- friend) 5/29 no answer.  I called her other daughter  [name] [phone]  and upated 5/30, they arrived at the bedside to have a discussion with palliative care 5/30.  Patient status is: Inpatient because of ESRD Level of care: Telemetry  Cardiac   Dispo: The patient is from: HOME            Anticipated disposition: TBD Objective: Vitals last 24 hrs: Vitals:   08/19/22 0900 08/19/22 0930 08/19/22 1000 08/19/22 1030  BP: (!) 155/73 134/73 (!) 140/68 (!) 163/74  Pulse: 69 73 73 66  Resp: (!) 24 14 17 14   Temp:      TempSrc:      SpO2: 100% 97% 98% 99%  Weight:      Height:       Weight change:   Physical Examination: General exam: AAox3, chronically ill-appearing, weak,older appearing HEENT:Oral mucosa moist, Ear/Nose WNL grossly, dentition normal. Respiratory system: bilaterally diminished BS,w/ mild crackles chest TDC  + dressing intact Cardiovascular system: S1 & S2 +, regular rate. Gastrointestinal system: Abdomen soft, NT,ND,BS+ Nervous System:Alert, awake, moving extremities and grossly nonfocal Extremities: LE ankle edema trace w/ scars, lower extremities warm Skin: No rashes,no icterus. MSK: weak muscle bulk, low tone/power   Medications reviewed:  Scheduled Meds:  arformoterol  15 mcg Nebulization BID   budesonide (PULMICORT) nebulizer solution  0.25 mg Nebulization BID   busPIRone  15 mg Oral BID   calcitRIOL  0.25 mcg Oral Q M,W,F-HD   Chlorhexidine Gluconate Cloth  6 each Topical Q0600   darbepoetin (ARANESP) injection - NON-DIALYSIS  200 mcg Subcutaneous Q Tue-1800   ferrous sulfate  325 mg Oral Q breakfast   gabapentin  100 mg Oral TID   heparin sodium (porcine)       levofloxacin  250 mg Oral Q48H   liver oil-zinc oxide   Topical BID   pantoprazole  40 mg  Oral BID AC   predniSONE  40 mg Oral Q breakfast   revefenacin  175 mcg Nebulization Daily   sodium chloride flush  3 mL Intravenous Q12H   sucralfate  1 g Oral BID  Continuous Infusions:  sodium chloride Stopped (08/16/22 2143)    Diet Order             Diet renal/carb modified with fluid restriction Diet-HS Snack? Nothing; Fluid restriction: 1500 mL Fluid; Room service appropriate? Yes; Fluid consistency: Thin  Diet effective now                   Intake/Output Summary (Last 24 hours) at 08/19/2022 1105 Last data filed at 08/19/2022 0645 Gross per 24 hour  Intake 680 ml  Output 1800 ml  Net -1120 ml  Net IO Since Admission: -2,658.91 mL [08/19/22 1105]  Wt Readings from Last 3 Encounters:  08/19/22 64.6 kg  08/04/22 57 kg  06/07/22 51.3 kg   Unresulted Labs (From admission, onward)     Start     Ordered   08/15/22 1314  Legionella Pneumophila Serogp 1 Ur Ag  Once,   R        08/15/22 1313   08/15/22 1300  Expectorated Sputum Assessment w Gram Stain, Rflx to Resp Cult  Once,   R        08/15/22 1259   08/15/22 0500  Renal function panel  Daily,   R      08/14/22 0845          Data Reviewed: I have personally reviewed following labs and imaging studies CBC: Recent Labs  Lab 08/12/22 1708 08/13/22 0039 08/15/22 0104 08/16/22 0036 08/17/22 0044 08/18/22 0105 08/19/22 0845  WBC 7.3   < > 6.9 9.2 9.0 9.8 12.0*  NEUTROABS 5.0  --   --   --   --   --   --   HGB 7.0*   < > 7.3* 8.3* 8.5* 8.4* 8.8*  HCT 22.0*   < > 23.4* 26.3* 26.2* 26.9* 29.0*  MCV 97.3   < > 91.4 92.9 93.6 94.1 96.7  PLT 371   < > 329 360 360 377 409*   < > = values in this interval not displayed.  Basic Metabolic Panel: Recent Labs  Lab 08/12/22 2140 08/13/22 0039 08/15/22 0104 08/16/22 0036 08/17/22 0044 08/18/22 0105 08/19/22 0144  NA  --    < > 134* 136 136 135 133*  K  --    < > 3.9 4.4 3.9 4.1 4.0  CL  --    < > 100 101 101 101 99  CO2  --    < > 23 23 25 24 24   GLUCOSE  --    < > 92 109* 105* 106* 111*  BUN  --    < > 35* 41* 26* 37* 50*  CREATININE  --    < > 3.16* 3.60* 2.57* 3.01* 3.51*  CALCIUM  --    < > 7.8*  7.9* 8.2* 8.0* 8.1* 8.0*  MG 1.9  --   --   --   --   --   --   PHOS  --   --  4.2 4.1 3.1 2.8 2.3*   < > = values in this interval not displayed.  GFR: Recent Labs  Lab 08/12/22 1708 08/13/22 0039 08/15/22 0104 08/16/22 0036 08/17/22 0044 08/18/22 0105 08/19/22 0144  AST 16 16  --   --    --   --   --  ALT 11 10  --   --   --   --   --   ALKPHOS 77 73  --   --   --   --   --   BILITOT 0.6 0.4  --   --   --   --   --   PROT 7.1 6.7  --   --   --   --   --   ALBUMIN 2.2* 2.1* 2.0* 2.1* 1.9* 1.9* 2.0*   Recent Labs  Lab 08/12/22 2140  INR 1.1   Recent Labs  Lab 08/14/22 1250  GLUCAP 92   Recent Labs  Lab 08/12/22 1720 08/12/22 2140 08/15/22 0058 08/16/22 0036  PROCALCITON  --   --  0.38 0.38  LATICACIDVEN 2.1* 0.8  --   --    Recent Results (from the past 240 hour(s))  Culture, blood (Routine x 2)     Status: None   Collection Time: 08/12/22  5:08 PM   Specimen: BLOOD RIGHT ARM  Result Value Ref Range Status   Specimen Description BLOOD RIGHT ARM  Final   Special Requests   Final    BOTTLES DRAWN AEROBIC AND ANAEROBIC Blood Culture results may not be optimal due to an inadequate volume of blood received in culture bottles   Culture   Final    NO GROWTH 5 DAYS Performed at [facility], [address], [place], [place] [address]    Report Status 08/17/2022 FINAL  Final  Culture, blood (Routine x 2)     Status: None   Collection Time: 08/12/22  5:13 PM   Specimen: BLOOD  Result Value Ref Range Status   Specimen Description BLOOD LEFT ANTECUBITAL  Final   Special Requests   Final    BOTTLES DRAWN AEROBIC AND ANAEROBIC Blood Culture adequate volume   Culture   Final    NO GROWTH 5 DAYS Performed at [facility], [address] [place], [place] [address]    Report Status 08/17/2022 FINAL  Final  Urine Culture     Status: Abnormal   Collection Time: 08/15/22  3:03 PM   Specimen: Urine, Random  Result Value Ref Range Status   Specimen Description URINE, RANDOM  Final   Special Requests NONE Reflexed from [id number]  Final   Culture (A)  Final    >=100,000 COLONIES/mL KLEBSIELLA PNEUMONIAE Confirmed Extended Spectrum Beta-Lactamase Producer (ESBL).  In bloodstream infections from ESBL organisms, carbapenems are preferred over  piperacillin/tazobactam. They are shown to have a lower risk of mortality. MULTI-DRUG RESISTANT ORGANISM CRITICAL RESULT CALLED TO, READ BACK BY AND VERIFIED WITH: RN [facility] Brooke Dare [phone] BY Berline Chough, MT Performed at [facility], [address], [place], [place] [address]    Report Status 08/17/2022 FINAL  Final   Organism ID, Bacteria KLEBSIELLA PNEUMONIAE (A)  Final      Susceptibility   Klebsiella pneumoniae - MIC*    AMPICILLIN >=32 RESISTANT Resistant     CEFAZOLIN >=64 RESISTANT Resistant     CEFEPIME >=32 RESISTANT Resistant     CEFTRIAXONE >=64 RESISTANT Resistant     CIPROFLOXACIN <=0.25 SENSITIVE Sensitive     GENTAMICIN >=16 RESISTANT Resistant     IMIPENEM <=0.25 SENSITIVE Sensitive     NITROFURANTOIN 128 RESISTANT Resistant     TRIMETH/SULFA >=320 RESISTANT Resistant     AMPICILLIN/SULBACTAM >=32 RESISTANT Resistant     PIP/TAZO <=4 SENSITIVE Sensitive     * >=100,000 COLONIES/mL KLEBSIELLA  PNEUMONIAE  Respiratory (~20 pathogens) panel by PCR     Status: None   Collection Time: 08/15/22  5:25 PM   Specimen: Nasopharyngeal Swab; Respiratory  Result Value Ref Range Status   Adenovirus NOT DETECTED NOT DETECTED Final   Coronavirus 229E NOT DETECTED NOT DETECTED Final    Comment: (NOTE) The Coronavirus on the Respiratory Panel, DOES NOT test for the novel  Coronavirus (2019 nCoV)    Coronavirus HKU1 NOT DETECTED NOT DETECTED Final   Coronavirus NL63 NOT DETECTED NOT DETECTED Final   Coronavirus OC43 NOT DETECTED NOT DETECTED Final   Metapneumovirus NOT DETECTED NOT DETECTED Final   Rhinovirus / Enterovirus NOT DETECTED NOT DETECTED Final   Influenza A NOT DETECTED NOT DETECTED Final   Influenza B NOT DETECTED NOT DETECTED Final   Parainfluenza Virus 1 NOT DETECTED NOT DETECTED Final   Parainfluenza Virus 2 NOT DETECTED NOT DETECTED Final   Parainfluenza Virus 3 NOT DETECTED NOT DETECTED Final   Parainfluenza Virus 4 NOT DETECTED NOT DETECTED Final    Respiratory Syncytial Virus NOT DETECTED NOT DETECTED Final   Bordetella pertussis NOT DETECTED NOT DETECTED Final   Bordetella Parapertussis NOT DETECTED NOT DETECTED Final   Chlamydophila pneumoniae NOT DETECTED NOT DETECTED Final   Mycoplasma pneumoniae NOT DETECTED NOT DETECTED Final    Comment: Performed at [facility], [address], [place], [place] [address]  SARS Coronavirus 2 by RT PCR (hospital order, performed in [facility] hospital lab) *cepheid single result test* Anterior Nasal Swab     Status: None   Collection Time: 08/15/22  5:25 PM   Specimen: Anterior Nasal Swab  Result Value Ref Range Status   SARS Coronavirus 2 by RT PCR NEGATIVE NEGATIVE Final    Comment: Performed at [facility], [address], [place], [place] [address]    Antimicrobials: Anti-infectives (From admission, onward)    Start     Dose/Rate Route Frequency Ordered Stop   08/19/22 1800  levofloxacin (LEVAQUIN) tablet 250 mg       See Hyperspace for full Linked Orders Report.   250 mg Oral Every 48 hours 08/17/22 1124 08/21/22 1759   08/17/22 1200  levofloxacin (LEVAQUIN) tablet 250 mg  Status:  Discontinued        250 mg Oral Daily 08/17/22 1113 08/17/22 1123   08/17/22 1200  levofloxacin (LEVAQUIN) tablet 500 mg       See Hyperspace for full Linked Orders Report.   500 mg Oral  Once 08/17/22 1124 08/17/22 1537   08/15/22 1415  cefTRIAXone (ROCEPHIN) 1 g in sodium chloride 0.9 % 100 mL IVPB  Status:  Discontinued        1 g 200 mL/hr over 30 Minutes Intravenous Every 24 hours 08/15/22 1315 08/17/22 1113   08/15/22 1415  azithromycin (ZITHROMAX) 500 mg in sodium chloride 0.9 % 250 mL IVPB  Status:  Discontinued        500 mg 250 mL/hr over 60 Minutes Intravenous Every 24 hours 08/15/22 1315 08/15/22 1322   08/15/22 1415  azithromycin (ZITHROMAX) tablet 500 mg  Status:  Discontinued        500 mg Oral Daily 08/15/22 1322 08/17/22 1113   08/14/22 0600  cefUROXime (ZINACEF) 1.5 g in  sodium chloride 0.9 % 100 mL IVPB        1.5 g 200 mL/hr over 30 Minutes Intravenous On call to O.R. 08/13/22 1243 08/14/22 1335     Culture/Microbiology    Component  Value Date/Time   SDES URINE, RANDOM 08/15/2022 1503   SPECREQUEST NONE Reflexed from [id number] 08/15/2022 1503   CULT (A) 08/15/2022 1503    >=100,000 COLONIES/mL KLEBSIELLA PNEUMONIAE Confirmed Extended Spectrum Beta-Lactamase Producer (ESBL).  In bloodstream infections from ESBL organisms, carbapenems are preferred over piperacillin/tazobactam. They are shown to have a lower risk of mortality. MULTI-DRUG RESISTANT ORGANISM CRITICAL RESULT CALLED TO, READ BACK BY AND VERIFIED WITH: RN [facility] Brooke Dare [phone] BY Berline Chough, MT Performed at [facility], [address], [place], [place] [address]    REPTSTATUS 08/17/2022 FINAL 08/15/2022 1503   Radiology Studies: [facility] Chest Port 1 View  Result Date: 08/18/2022 CLINICAL DATA:  Pneumonia EXAM: PORTABLE CHEST 1 VIEW COMPARISON:  August 17, 2022 FINDINGS: Stable hemodialysis catheter. No pneumothorax. Stable cardiomediastinal silhouette. Stable bilateral pleural effusions and pulmonary edema. IMPRESSION: Stable pleural effusions and pulmonary edema. Electronically Signed   By: Gerome Sam III M.D.   On: 08/18/2022 11:54    LOS: 6 days  Lanae Boast, MD  [facility] 08/19/2022, 11:05 AM

## 2022-08-19 NOTE — Progress Notes (Addendum)
[facility] NEPHROLOGY PROGRESS NOTE  Assessment/ Plan: Pt is a 62 y.o. yo female  with history of substance abuse, HTN, hep C, PAD, COPD and poor social situation with progressive CKD, with failure to thrive and multiple hospitalization.  # Progressive CKD to new ESRD: S/p R AVF and TDC placed on 5/29 and started HD. Receiving 3rd HD today.  Tolerating well. Renal navigator is already following to arrange outpatient HD.  # Hypertension/volume: Still making urine.  UF with HD.  Monitor BP.  DC Lasix.  # Metabolic acidosis: Treated with bicarbonate now on HD.  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  Since phosphorus is low I will discontinue PhosLo.  Monitor lab.  # Anemia of CKD and GI bleed: Status post GI procedure with confirmed AVM.  On iron and ESA.  Hemoglobin uptrending.  # Shortness of breath/acute hypoxia: Getting x-ray.  UF with HD.  # Disposition: Palliative care is following,  DNR  Subjective: Seen and examined in dialysis unit.  Patient reports mild shortness of breath.  No nausea, vomiting.  Dialysis is going well. Objective Vital signs in last 24 hours: Vitals:   08/19/22 0900 08/19/22 0930 08/19/22 1000 08/19/22 1030  BP: (!) 155/73 134/73 (!) 140/68 (!) 163/74  Pulse: 69 73 73 66  Resp: (!) 24 14 17 14   Temp:      TempSrc:      SpO2: 100% 97% 98% 99%  Weight:      Height:       Weight change:   Intake/Output Summary (Last 24 hours) at 08/19/2022 1054 Last data filed at 08/19/2022 0645 Gross per 24 hour  Intake 680 ml  Output 1800 ml  Net -1120 ml       Labs: RENAL PANEL Recent Labs  Lab 08/12/22 2140 08/13/22 0039 08/15/22 0104 08/16/22 0036 08/17/22 0044 08/18/22 0105 08/19/22 0144  NA  --    < > 134* 136 136 135 133*  K  --    < > 3.9 4.4 3.9 4.1 4.0  CL  --    < > 100 101 101 101 99  CO2  --    < > 23 23 25 24 24   GLUCOSE  --    < > 92 109* 105* 106* 111*  BUN  --    < > 35* 41* 26* 37* 50*  CREATININE  --    < > 3.16*  3.60* 2.57* 3.01* 3.51*  CALCIUM  --    < > 7.8*  7.9* 8.2* 8.0* 8.1* 8.0*  MG 1.9  --   --   --   --   --   --   PHOS  --   --  4.2 4.1 3.1 2.8 2.3*  ALBUMIN  --    < > 2.0* 2.1* 1.9* 1.9* 2.0*   < > = values in this interval not displayed.    Liver Function Tests: Recent Labs  Lab 08/12/22 1708 08/13/22 0039 08/15/22 0104 08/17/22 0044 08/18/22 0105 08/19/22 0144  AST 16 16  --   --   --   --   ALT 11 10  --   --   --   --   ALKPHOS 77 73  --   --   --   --   BILITOT 0.6 0.4  --   --   --   --   PROT 7.1 6.7  --   --   --   --   ALBUMIN 2.2*  2.1*   < > 1.9* 1.9* 2.0*   < > = values in this interval not displayed.   No results for input(s): "LIPASE", "AMYLASE" in the last 168 hours. No results for input(s): "AMMONIA" in the last 168 hours. CBC: Recent Labs    11/15/21 1228 11/15/21 1747 08/04/22 1023 08/05/22 0149 08/13/22 8119 08/13/22 1546 08/15/22 0104 08/16/22 0036 08/17/22 0044 08/18/22 0105 08/19/22 0845  HGB  --    < >  --    < >  --    < > 7.3* 8.3* 8.5* 8.4* 8.8*  MCV  --    < >  --    < >  --    < > 91.4 92.9 93.6 94.1 96.7  VITAMINB12  --   --   --   --  507  --   --   --   --   --   --   FOLATE  --   --   --   --  8.5  --   --   --   --   --   --   FERRITIN 11  --  39  --  160  --   --   --   --   --   --   TIBC 449  --   --   --  280  --   --   --   --   --   --   IRON 14*  --   --   --  21*  --   --   --   --   --   --   RETICCTPCT  --   --   --   --  2.2  --   --   --   --   --   --    < > = values in this interval not displayed.    Cardiac Enzymes: No results for input(s): "CKTOTAL", "CKMB", "CKMBINDEX", "TROPONINI" in the last 168 hours. CBG: Recent Labs  Lab 08/14/22 1250  GLUCAP 92    Iron Studies: No results for input(s): "IRON", "TIBC", "TRANSFERRIN", "FERRITIN" in the last 72 hours. Studies/Results: DG Chest Port 1 View  Result Date: 08/18/2022 CLINICAL DATA:  Pneumonia EXAM: PORTABLE CHEST 1 VIEW COMPARISON:  August 17, 2022  FINDINGS: Stable hemodialysis catheter. No pneumothorax. Stable cardiomediastinal silhouette. Stable bilateral pleural effusions and pulmonary edema. IMPRESSION: Stable pleural effusions and pulmonary edema. Electronically Signed   By: Gerome Sam III M.D.   On: 08/18/2022 11:54    Medications: Infusions:  sodium chloride Stopped (08/16/22 2143)    Scheduled Medications:  arformoterol  15 mcg Nebulization BID   budesonide (PULMICORT) nebulizer solution  0.25 mg Nebulization BID   busPIRone  15 mg Oral BID   calcitRIOL  0.25 mcg Oral Q M,W,F-HD   calcium acetate  667 mg Oral TID WC   Chlorhexidine Gluconate Cloth  6 each Topical Q0600   darbepoetin (ARANESP) injection - NON-DIALYSIS  200 mcg Subcutaneous Q Tue-1800   ferrous sulfate  325 mg Oral Q breakfast   furosemide  80 mg Intravenous BID   gabapentin  100 mg Oral TID   heparin sodium (porcine)       levofloxacin  250 mg Oral Q48H   liver oil-zinc oxide   Topical BID   pantoprazole  40 mg Oral BID AC   predniSONE  40 mg Oral Q breakfast   revefenacin  175 mcg Nebulization Daily  sodium chloride flush  3 mL Intravenous Q12H   sucralfate  1 g Oral BID    have reviewed scheduled and prn medications.  Physical Exam: General:NAD, comfortable Heart:RRR, s1s2 nl Lungs:clear b/l, no crackle Abdomen:soft, Non-tender, non-distended Extremities:No edema Dialysis Access: Right brachiobasilic AV fistula, right IJ  Christna Kulick Prasad Olamae Ferrara 08/19/2022,10:54 AM  LOS: 6 days

## 2022-08-19 NOTE — Progress Notes (Signed)
Contacted [facility] admissions to request an update on pt's financial clearance and clinic acceptance. Awaiting a response. Will assist as needed.   Olivia Canter Renal Navigator [phone]

## 2022-08-19 NOTE — Progress Notes (Addendum)
[name], MRN:  [id number], DOB:  1960/06/26, LOS: 6 ADMISSION DATE:  08/12/2022, CONSULTATION DATE:  08/19/22 REFERRING MD:  [name], CHIEF COMPLAINT:  hypoxia, SOB   History of Present Illness:  62 y.o. F with PMH significant for CKD stage IV, HFpEF, polysubstance abuse, tobacco use and COPD not on CPAP or home O2 who presented to the ED 5/28 with complaints of shortness of breath and LE swelling.  She had recently been admitted 5/16-5/20 for chronic GIB and iron deficiency anemia and discharged back to motel where she is living.  She required HFNC and CXR showed pulmonary edema.  She has had some productive cough and is still smoking (since age 84) about 0.5ppd. BNP was elevated to 1084.   Pt was admitted to [facility] and treated with lasix, steroids and nebulizers as she was wheezing on exam.   Nephrology was consulted and noted overall failure to thrive and CKD now progressing to ESRD and R IJ tunneled dialysis catheter was placed with creation of RUE AV fistula creation on 5/29.   First HD session the same with 1L removed, she also had 1700cc UOP.  On 5/30 she remained on HFNC 10L with wheezing, therefore PCCM consulted for further recommendations for hypoxia.  Pt up eating and in no distress, she says her breathing is "fine" and would like not to be bothered.   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   Significant Hospital Events: Including procedures, antibiotic start and stop dates in addition to other pertinent events   5/28 admitted to [facility] 5/29  started HD 5/30 remains on HFNC, PCCM consult, added steroids and BDs. Abx started.  Had her first round of hemodialysis 5/31 down to 8 lpm still has cough 08/19/2022>> Pt weaned down to 4 L, HD today, CXR 08/19/2022 shows Unchanged appearance of the chest with bilateral airspace opacities/edema, bilateral pleural effusions and bibasilar atelectasis.Personally reviewed by me  Interim History / Subjective:  States her breathing is better. Weaned down to 4 L [place]. Just got back from HD , 3 liters off in HD per I&O, Net negative 5.6 L  Objective   Blood pressure (!) 164/73, pulse 72, temperature 98.2 F (36.8 C), temperature source Oral, resp. rate 19, height 5\' 7"  (1.702 m), weight 63.3 kg, SpO2 94 %.        Intake/Output Summary (Last 24 hours) at 08/19/2022 4098 Last data filed at 08/19/2022 0645 Gross per 24 hour  Intake 680 ml  Output 1800 ml  Net -1120 ml   Filed Weights   08/17/22 0400 08/18/22 0844 08/19/22 0500  Weight: 61.6 kg 64.4 kg 63.3 kg     Resolved Hospital Problem list   General chronically ill appearing 62 year old female  HENT NCAT , MM dry Bilateral chest excursion, Pulm coarse rhonchi , diminished per bases, few rales Portable chest x-ray pending post HD>> 6/2 showed stable effusions and pulmonary edema Card rrr Abd soft, NT, ND, BS + Ext warm multiple areas of ecchymosis  Neuro intact  Assessment & Plan:  Acute Hypoxic Respiratory Failure due to volume overload from ESRD HFpEF mild AECOPD baseline COPD and tobacco use OSA  Stage V CKD/ESRD Acute blood loss anemia 2/2 bleeding AVMs (discovered via EGD 5/30)   Acute Hypoxic Respiratory Failure due to volume overload from ESRD, HFpEF w/ mild AECOPD (baseline COPD and tobacco use)  Discussion Oxygen has been weaned to 4 L [place], CXR is unchanged   Plan Continuing volume removal with intermittent dialysis ( Last 08/19/2022)  & diuresis  Continue scheduled bronchodilators Drop  prednisone taper start at 30 mg/d  ( starting 6/4) Day #5 azithromycin and ceftriaxone CXR ordered 6/3>>  if effusions not clearing we should consider [place] eval and possible thora , however may just need additional time on HD to pull additional fluid Wean oxygen as able to maintain sats at > 92% Pulse oximetry Ambulate as able    Best Practice (right click and "Reselect all SmartList Selections" daily)   Per primary team Cynthia Ngo, MSN, AGACNP-BC [facility] See Amion for personal pager PCCM on call pager [phone]  08/19/2022 1:45 PM

## 2022-08-19 NOTE — Progress Notes (Addendum)
Received patient in bed.Awake,alert and oriented x 4.  Access used : Right HD catheter that worked well.Dressing on date.  Duration of treatment: 3.5 hours.  Fluid removed :  Achieved UF goal of 3 liters.   Medicine given : Calcitrol  Hemo comment:None  Hand off to the patient's nurse.

## 2022-08-20 ENCOUNTER — Other Ambulatory Visit (HOSPITAL_COMMUNITY): Payer: Self-pay

## 2022-08-20 DIAGNOSIS — J441 Chronic obstructive pulmonary disease with (acute) exacerbation: Secondary | ICD-10-CM

## 2022-08-20 DIAGNOSIS — J449 Chronic obstructive pulmonary disease, unspecified: Secondary | ICD-10-CM

## 2022-08-20 LAB — RENAL FUNCTION PANEL
Albumin: 2 g/dL — ABNORMAL LOW (ref 3.5–5.0)
Anion gap: 10 (ref 5–15)
BUN: 35 mg/dL — ABNORMAL HIGH (ref 8–23)
CO2: 25 mmol/L (ref 22–32)
Calcium: 7.9 mg/dL — ABNORMAL LOW (ref 8.9–10.3)
Chloride: 98 mmol/L (ref 98–111)
Creatinine, Ser: 2.89 mg/dL — ABNORMAL HIGH (ref 0.44–1.00)
GFR, Estimated: 18 mL/min — ABNORMAL LOW (ref >60)
Glucose, Bld: 106 mg/dL — ABNORMAL HIGH (ref 70–99)
Phosphorus: 1.7 mg/dL — ABNORMAL LOW (ref 2.5–4.6)
Potassium: 3.7 mmol/L (ref 3.5–5.1)
Sodium: 133 mmol/L — ABNORMAL LOW (ref 135–145)

## 2022-08-20 LAB — LEGIONELLA PNEUMOPHILA SEROGP 1 UR AG: L. pneumophila Serogp 1 Ur Ag: NEGATIVE

## 2022-08-20 MED ORDER — FUROSEMIDE 80 MG PO TABS
80.0000 mg | ORAL_TABLET | ORAL | 0 refills | Status: DC
  Filled 2022-08-20: qty 30, 70d supply, fill #0

## 2022-08-20 MED ORDER — PREDNISONE 10 MG PO TABS
ORAL_TABLET | ORAL | 0 refills | Status: DC
  Filled 2022-08-20: qty 15, 8d supply, fill #0

## 2022-08-20 MED ORDER — CHLORHEXIDINE GLUCONATE CLOTH 2 % EX PADS: 6.0000 | MEDICATED_PAD | Freq: Every day | CUTANEOUS | Status: DC

## 2022-08-20 MED ORDER — SUCRALFATE 1 G PO TABS
1.0000 g | ORAL_TABLET | Freq: Two times a day (BID) | ORAL | 0 refills | Status: DC
  Filled 2022-08-20: qty 14, 7d supply, fill #0

## 2022-08-20 MED ORDER — ORAL CARE MOUTH RINSE: 15.0000 mL | OROMUCOSAL | Status: DC | PRN

## 2022-08-20 NOTE — TOC Transition Note (Signed)
Transition of Care [facility]) - CM/SW Discharge Note   Patient Details  Name: [name] MRN: [id number] Date of Birth: 05/13/1960  [facility] CM/SW Contact:  Leone Haven, RN Phone Number: 08/20/2022, 2:17 PM   Clinical Narrative:    Patient is for dc today, CSW is working on setting up transportation for HD for patient. She just got clipped today.  NCM informed staff RN that transportation will need to be set up before she can leave.   Final next level of care: Home/Self Care ([employer]) Barriers to Discharge: Inadequate or no insurance, Transportation   Patient Goals and CMS Choice   Choice offered to / list presented to : NA  Discharge Placement                         Discharge Plan and Services Additional resources added to the After Visit Summary for   In-house Referral: NA Discharge Planning Services: CM Consult Post Acute Care Choice: NA          DME Arranged: N/A DME Agency: NA       HH Arranged: NA          Social Determinants of Health (SDOH) Interventions SDOH Screenings   Food Insecurity: No Food Insecurity (08/13/2022)  Housing: Patient Unable To Answer (08/13/2022)  Transportation Needs: No Transportation Needs (08/13/2022)  Recent Concern: Transportation Needs - Unmet Transportation Needs (08/02/2022)  Utilities: Not At Risk (08/13/2022)  Depression (PHQ2-9): Medium Risk (01/21/2022)  Tobacco Use: High Risk (08/19/2022)     Readmission Risk Interventions    08/13/2022    4:47 PM 08/02/2022    1:56 PM 11/16/2021   12:16 PM  Readmission Risk Prevention Plan  Transportation Screening Complete Complete Complete  PCP or Specialist Appt within 3-5 Days   Complete  HRI or Home Care Consult   Complete  Social Work Consult for Recovery Care Planning/Counseling   Complete  Palliative Care Screening   Not Applicable  Medication Review [employer]ographer) Complete Referral to Pharmacy Complete  PCP or Specialist  appointment within 3-5 days of discharge Complete Complete   HRI or Home Care Consult Complete Complete   SW Recovery Care/Counseling Consult  Complete   Palliative Care Screening Not Applicable Not Applicable   Skilled Nursing Facility Not Applicable Not Applicable

## 2022-08-20 NOTE — Care Management Important Message (Signed)
Important Message  Patient Details  Name: [name] MRN: [id number] Date of Birth: [birth year]   Medicare Important Message Given:  Yes     Renie Ora 08/20/2022, 11:37 AM

## 2022-08-20 NOTE — Progress Notes (Signed)
[facility] NEPHROLOGY PROGRESS NOTE  Assessment/ Plan: Pt is a 62 y.o. yo female  with history of substance abuse, HTN, hep C, PAD, COPD and poor social situation with progressive CKD, with failure to thrive and multiple hospitalization.  # Progressive CKD to new ESRD: S/p R AVF and TDC placed on 5/29 and started HD. Tolerating dialysis well.  Last HD yesterday.  Plan for MWF schedule. Renal navigator is already following to arrange outpatient HD.  # Hypertension/volume: Still making urine.  UF with HD.  Monitor BP.  DC Lasix.  # Metabolic acidosis: Treated with bicarbonate now on HD.  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  Since phosphorus was I will discontinued PhosLo.  Monitor lab.  # Anemia of CKD and GI bleed: Status post GI procedure with confirmed AVM.  On iron and ESA.  Hemoglobin uptrending.  # Shortness of breath/acute hypoxia:  UF with HD.  # Disposition: Palliative care is following,  DNR.  Okay to [facility] when outpatient HD is arranged.  Subjective: Seen and examined.  Reports her breathing is much better.  Denies nausea, vomiting, chest pain.  No major event.    Objective Vital signs in last 24 hours: Vitals:   08/20/22 0157 08/20/22 0445 08/20/22 0837 08/20/22 0838  BP: (!) 142/64 (!) 124/105    Pulse: 68 66    Resp: 13 11    Temp: 98.2 F (36.8 C) 98 F (36.7 C)    TempSrc: Oral Oral    SpO2: 94% 93% 96% 99%  Weight:  59.4 kg    Height:       Weight change: 0.2 kg  Intake/Output Summary (Last 24 hours) at 08/20/2022 1025 Last data filed at 08/20/2022 0400 Gross per 24 hour  Intake 480 ml  Output 4680 ml  Net -4200 ml        Labs: RENAL PANEL Recent Labs  Lab 08/16/22 0036 08/17/22 0044 08/18/22 0105 08/19/22 0144 08/20/22 0125  NA 136 136 135 133* 133*  K 4.4 3.9 4.1 4.0 3.7  CL 101 101 101 99 98  CO2 23 25 24 24 25   GLUCOSE 109* 105* 106* 111* 106*  BUN 41* 26* 37* 50* 35*  CREATININE 3.60* 2.57* 3.01* 3.51* 2.89*   CALCIUM 8.2* 8.0* 8.1* 8.0* 7.9*  PHOS 4.1 3.1 2.8 2.3* 1.7*  ALBUMIN 2.1* 1.9* 1.9* 2.0* 2.0*     Liver Function Tests: Recent Labs  Lab 08/18/22 0105 08/19/22 0144 08/20/22 0125  ALBUMIN 1.9* 2.0* 2.0*    No results for input(s): "LIPASE", "AMYLASE" in the last 168 hours. No results for input(s): "AMMONIA" in the last 168 hours. CBC: Recent Labs    11/15/21 1228 11/15/21 1747 08/04/22 1023 08/05/22 0149 08/13/22 0952 08/13/22 1546 08/15/22 0104 08/16/22 0036 08/17/22 0044 08/18/22 0105 08/19/22 0845  HGB  --    < >  --    < >  --    < > 7.3* 8.3* 8.5* 8.4* 8.8*  MCV  --    < >  --    < >  --    < > 91.4 92.9 93.6 94.1 96.7  VITAMINB12  --   --   --   --  507  --   --   --   --   --   --   FOLATE  --   --   --   --  8.5  --   --   --   --   --   --  FERRITIN 11  --  39  --  160  --   --   --   --   --   --   TIBC 449  --   --   --  280  --   --   --   --   --   --   IRON 14*  --   --   --  21*  --   --   --   --   --   --   RETICCTPCT  --   --   --   --  2.2  --   --   --   --   --   --    < > = values in this interval not displayed.     Cardiac Enzymes: No results for input(s): "CKTOTAL", "CKMB", "CKMBINDEX", "TROPONINI" in the last 168 hours. CBG: Recent Labs  Lab 08/14/22 1250  GLUCAP 92     Iron Studies: No results for input(s): "IRON", "TIBC", "TRANSFERRIN", "FERRITIN" in the last 72 hours. Studies/Results: DG CHEST PORT 1 VIEW  Result Date: 08/19/2022 CLINICAL DATA:  Pulmonary edema. EXAM: PORTABLE CHEST 1 VIEW COMPARISON:  08/18/2022 and prior studies FINDINGS: The cardiomediastinal silhouette is unchanged. A RIGHT IJ central venous catheter tip overlying the SUPERIOR vena cava again noted. Bilateral airspace opacities, bilateral pleural effusions and bibasilar atelectasis again noted. There is no evidence of pneumothorax. IMPRESSION: Unchanged appearance of the chest with bilateral airspace opacities/edema, bilateral pleural effusions and bibasilar  atelectasis. Electronically Signed   By: Harmon Pier M.D.   On: 08/19/2022 14:34    Medications: Infusions:  sodium chloride Stopped (08/16/22 2143)    Scheduled Medications:  arformoterol  15 mcg Nebulization BID   budesonide (PULMICORT) nebulizer solution  0.25 mg Nebulization BID   busPIRone  15 mg Oral BID   calcitRIOL  0.25 mcg Oral Q M,W,F-HD   Chlorhexidine Gluconate Cloth  6 each Topical Q0600   darbepoetin (ARANESP) injection - NON-DIALYSIS  200 mcg Subcutaneous Q Tue-1800   ferrous sulfate  325 mg Oral Q breakfast   gabapentin  100 mg Oral TID   liver oil-zinc oxide   Topical BID   pantoprazole  40 mg Oral BID AC   predniSONE  30 mg Oral Q breakfast   revefenacin  175 mcg Nebulization Daily   sodium chloride flush  3 mL Intravenous Q12H   sucralfate  1 g Oral BID    have reviewed scheduled and prn medications.  Physical Exam: General:NAD, comfortable Heart:RRR, s1s2 nl Lungs:clear b/l, no crackle Abdomen:soft, Non-tender, non-distended Extremities:No edema Dialysis Access: Right brachiobasilic AV fistula, right IJ  Win Guajardo Prasad Naheim Burgen 08/20/2022,10:25 AM  LOS: 7 days

## 2022-08-20 NOTE — Progress Notes (Signed)
Mobility Specialist Progress Note: Nurse requested Mobility Specialist to perform oxygen saturation test with pt which includes removing pt from oxygen both at rest and while ambulating.  Below are the results from that testing.     Patient Saturations on Room Air at Rest = spO2 96%  Patient Saturations on Room Air while Ambulating = sp02 90% .    Reported results to nurse.    Thompson Grayer Mobility Specialist  Please contact vis Secure Chat or  Rehab Office [phone]

## 2022-08-20 NOTE — Progress Notes (Addendum)
Message left at [facility] requesting a return call to discuss out-pt HD arrangements. Awaiting a return call.   Olivia Canter Renal Navigator [phone]  Addendum at 2:15 pm: Pt has been accepted at [facility] on TTS 12:00 chair time. Pt can start on Thursday and will need to arrive at 11:00 to complete paperwork prior to treatment. Met with pt at bedside to discuss arrangements. Schedule letter provided to pt as well. Pt agreeable to plan. Arrangements added to pt's AVS. Contacted attending, nephrologist, pt's RN, RN CM,and CSW with update. Contacted renal NP to request that orders be sent to clinic. Clinic advised pt will d/c today and start on Thursday as planned.

## 2022-08-20 NOTE — Progress Notes (Signed)
PROGRESS NOTE [name]  VWU:[id number] DOB: May 27, 1960 DOA: 08/12/2022 PCP: [name], MD  Brief Narrative/Hospital Course: 10 yof w/ for social situation, PAD GERD gastritis, hypertension, anemia, CKD 5, COPD, hepatitis C, Hyperlipidemia, failure to thrive, recent admission 5/16-5/20 for symptomatic anemia due to chronic GI bleeding needing 2 units PRBC, FOBT was negative, presented with fluid retention shortness of breath,hypoxa at 80%, anasarca in legs arms and abdomen  In the ED mildly tachycardic, he did not with nasal cannula, BP on higher side 160s to 170s mild tachypnea-24 Labs creatinine elevated 4.19 hemoglobin 7.1 g> 7.0 g, lactic acid 2.1> 0.8, BNP elevated 1084. Chest x-ray cardiomegaly and pulmonary edema with diffuse bilateral interstitial pulmonary opacities right greater than left small pleural effusion.  Patient was admitted for fluid overload, anemia acute hypoxic respiratory failure  Since being managed with diuretics for CHF, for COPD exacerbation and fluid overload in the setting of CKD Transfused 1 unit PRBC for anemia Seen by nephrology and critical care needed HFNC up to 8 L Palliative care consulted 5/30  S/p SBE BY gi :5/31: 9 AVMs noted. 1 with evidence of recent stigmata of bleeding.  GI advised to continue PPI and outpatient follow-up She has been initiated on dialysis DAY 1 ON 5/29 after [facility] by IR    Subjective: Seen and examined this morning.  Resting comfortably Anxious to go home soon. Overnight afebrile BP stable oxygen requirement down to 2 L nasal cannula Labs reviewed stable potassium and bicarb  Assessment and Plan: Principal Problem:   Acute on chronic diastolic CHF (congestive heart failure) (HCC) Active Problems:   Chronic hepatitis C without hepatic coma (HCC)   HLD (hyperlipidemia)   Hypertension   Chronic leg pain   Anxiety and depression   Gastroesophageal reflux disease   IDA (iron deficiency anemia)   Chronic obstructive  pulmonary disease (HCC)   Polysubstance abuse (HCC)   Gastritis and gastroduodenitis   PUD (peptic ulcer disease)   CKD (chronic kidney disease) stage 5, GFR less than 15 ml/min (HCC)   Acute respiratory failure with hypoxia (HCC)   Heme positive stool   Acute on chronic anemia   Hypoxia   Acute on chronic congestive heart failure (HCC)   Pleural effusion  Acute hypoxic respiratory failure Acute pulmonary edema Bilateral pleural effusion in the setting of fluid overload Acute on chronic diastolic CHF and fluid overload due to ESRD COPD and concern for pneumonia: Shortness of breath hypoxia multifactorial in the setting of fluid overload primarily due to patient's CKD now progressed to ESRD.  Also component of COPD CHF and pneumonia. BNP elevated 1084-Echo showed EF 55 to 60% and G1 DD, imaging with pulmonary edema pleural effusion cardiomegaly>Managed with antibiotics diuretics, followed  by nephrology critical care. HD initiated 5/29.CAP coverage added 5/30 per PCCM> antibiotic adjusted to levaquin 6/1 for UTI. Weaned to room air this morning doing well ambulate on RA  Progressive CKD stage V-now progressing to ESRD: Now on HD,Nephrology following TDC placed and first HD 5/29. S/P hd 5/31.6/2.Patient has had very strong emotional reaction to starting HD nephrology has advised to give it a try for a few weeks before further decision. Treatment #3  6/3. Social worker following for outpatient HD set up  Net IO Since Admission: -7,108.91 mL [08/20/22 0832]   Metabolic acidosis in the setting of CKD/ESRD, resolved  Suspected Community acquired pneumonia Acute COPD exacerbation Current active smoker Pleural effusion B/L: Continue on prednisone, Brovana Pulmicort bronchodilators appreciate pulmonary input on  board .  Continue pneumonia coverage but given ESBL Klebsiella changed Rocephin/azithromycin to Levaquin 6/1, continue supplemental oxygen and wean.  Repeat chest x-ray 6/3 unchanged  hyperaeration of bilateral airspace opacities/edema, bilateral pleural effusion and bibasilar atelectasis.  No plan for thoracentesis for now- plan per PCCM  ESBL Klebsiella UTI Completed levofloxacin renal dosing.    Anemia multifactorial likely from anemia of chronic kidney disease PUD/gastritis/GERD and diverticulosis FOBT+ Acute blood loss anemia due to GI bleeding from AVM s/p 1 unit PRBC -anemia panel done recently with low iron storage ON EPO/ IV Iron per nephro. GI consulted-S/p SBE:5/31: 9 AVMs noted. 1 with evidence of recent stigmata of bleeding.  Continue PPI and outpatient follow-up with GI.  GI signed off.   Recent Labs  Lab 08/15/22 0104 08/16/22 0036 08/17/22 0044 08/18/22 0105 08/19/22 0845  HGB 7.3* 8.3* 8.5* 8.4* 8.8*  HCT 23.4* 26.3* 26.2* 26.9* 29.0*    HTN:BP on higher side allow higher bp to allow diuresis and HD.Holding amlodipine.    Substance ZOX:WRUEAVW currently using marijuana only.  Depression/Anxiety:Mood is stable ,continue home BuSpar and trazodone  Neuropathy:Continue home Gabapentin, she also takes Percocet 1 every 8 hours at home.   Debility deconditioning complex medical comorbidities/noncompliance: GOC: Palliative care consultation has been appreciated they had discussed with patient and the daughter, she has been changed to DNR, ongoing further discussion about GOC   DVT prophylaxis: Place and maintain sequential compression device Start: 08/13/22 1130 Code Status:   Code Status: DNR Family Communication: plan of care discussed with patient at bedside. I called her daughter [name] and [name] ( significant other- friend) 5/29 no answer.  I called her other daughter  [name] [phone]  and upated 5/30, they arrived at the bedside to have a discussion with palliative care 5/30.  Patient status is: Inpatient because of ESRD Level of care: Telemetry Cardiac   Dispo: The patient is from: HOME            Anticipated disposition: home once HD is set  up as OP Objective: Vitals last 24 hrs: Vitals:   08/19/22 1253 08/19/22 2223 08/20/22 0157 08/20/22 0445  BP:  (!) 155/82 (!) 142/64 (!) 124/105  Pulse:  76 68 66  Resp:  13 13 11   Temp:  98.2 F (36.8 C) 98.2 F (36.8 C) 98 F (36.7 C)  TempSrc:  Oral Oral Oral  SpO2:  96% 94% 93%  Weight: 61.6 kg   59.4 kg  Height:       Weight change: 0.2 kg  Physical Examination: General exam: AAOX3, weak,older appearing HEENT:Oral mucosa moist, Ear/Nose WNL grossly, dentition normal. Respiratory system: bilaterally CLEAR BS, no use of accessory muscle.tdc+ Cardiovascular system: S1 & S2 +, regular rate. Gastrointestinal system: Abdomen soft, NT,ND,BS+ Nervous System:Alert, awake, moving extremities and grossly nonfocal Extremities: LE ankle edema NEG, lower extremities warm Skin:No rashes,no icterus. GNF:AOZHYQ muscle bulk,tone, power   Medications reviewed:  Scheduled Meds:  arformoterol  15 mcg Nebulization BID   budesonide (PULMICORT) nebulizer solution  0.25 mg Nebulization BID   busPIRone  15 mg Oral BID   calcitRIOL  0.25 mcg Oral Q M,W,F-HD   Chlorhexidine Gluconate Cloth  6 each Topical Q0600   darbepoetin (ARANESP) injection - NON-DIALYSIS  200 mcg Subcutaneous Q Tue-1800   ferrous sulfate  325 mg Oral Q breakfast   gabapentin  100 mg Oral TID   liver oil-zinc oxide   Topical BID   pantoprazole  40 mg Oral BID AC   predniSONE  30 mg Oral Q breakfast   revefenacin  175 mcg Nebulization Daily   sodium chloride flush  3 mL Intravenous Q12H   sucralfate  1 g Oral BID  Continuous Infusions:  sodium chloride Stopped (08/16/22 2143)    Diet Order             Diet renal/carb modified with fluid restriction Diet-HS Snack? Nothing; Fluid restriction: 1500 mL Fluid; Room service appropriate? Yes; Fluid consistency: Thin  Diet effective now                  Intake/Output Summary (Last 24 hours) at 08/20/2022 0832 Last data filed at 08/20/2022 0400 Gross per 24 hour   Intake 480 ml  Output 4680 ml  Net -4200 ml   Net IO Since Admission: -7,108.91 mL [08/20/22 0832]  Wt Readings from Last 3 Encounters:  08/20/22 59.4 kg  08/04/22 57 kg  06/07/22 51.3 kg   Unresulted Labs (From admission, onward)     Start     Ordered   08/15/22 1314  Legionella Pneumophila Serogp 1 Ur Ag  Once,   R        08/15/22 1313   08/15/22 1300  Expectorated Sputum Assessment w Gram Stain, Rflx to Resp Cult  Once,   R        08/15/22 1259   08/15/22 0500  Renal function panel  Daily,   R      08/14/22 0845          Data Reviewed: I have personally reviewed following labs and imaging studies CBC: Recent Labs  Lab 08/15/22 0104 08/16/22 0036 08/17/22 0044 08/18/22 0105 08/19/22 0845  WBC 6.9 9.2 9.0 9.8 12.0*  HGB 7.3* 8.3* 8.5* 8.4* 8.8*  HCT 23.4* 26.3* 26.2* 26.9* 29.0*  MCV 91.4 92.9 93.6 94.1 96.7  PLT 329 360 360 377 409*   Basic Metabolic Panel: Recent Labs  Lab 08/16/22 0036 08/17/22 0044 08/18/22 0105 08/19/22 0144 08/20/22 0125  NA 136 136 135 133* 133*  K 4.4 3.9 4.1 4.0 3.7  CL 101 101 101 99 98  CO2 23 25 24 24 25   GLUCOSE 109* 105* 106* 111* 106*  BUN 41* 26* 37* 50* 35*  CREATININE 3.60* 2.57* 3.01* 3.51* 2.89*  CALCIUM 8.2* 8.0* 8.1* 8.0* 7.9*  PHOS 4.1 3.1 2.8 2.3* 1.7*   GFR: Recent Labs  Lab 08/16/22 0036 08/17/22 0044 08/18/22 0105 08/19/22 0144 08/20/22 0125  ALBUMIN 2.1* 1.9* 1.9* 2.0* 2.0*    No results for input(s): "INR", "PROTIME" in the last 168 hours.  Recent Labs  Lab 08/14/22 1250  GLUCAP 92    Recent Labs  Lab 08/15/22 0058 08/16/22 0036  PROCALCITON 0.38 0.38    Recent Results (from the past 240 hour(s))  Culture, blood (Routine x 2)     Status: None   Collection Time: 08/12/22  5:08 PM   Specimen: BLOOD RIGHT ARM  Result Value Ref Range Status   Specimen Description BLOOD RIGHT ARM  Final   Special Requests   Final    BOTTLES DRAWN AEROBIC AND ANAEROBIC Blood Culture results may not be  optimal due to an inadequate volume of blood received in culture bottles   Culture   Final    NO GROWTH 5 DAYS Performed at [facility], [address] [place], [place] [address]    Report Status 08/17/2022 FINAL  Final  Culture, blood (Routine x 2)     Status: None   Collection  Time: 08/12/22  5:13 PM   Specimen: BLOOD  Result Value Ref Range Status   Specimen Description BLOOD LEFT ANTECUBITAL  Final   Special Requests   Final    BOTTLES DRAWN AEROBIC AND ANAEROBIC Blood Culture adequate volume   Culture   Final    NO GROWTH 5 DAYS Performed at [facility], [address] [place], [place] [address]    Report Status 08/17/2022 FINAL  Final  Urine Culture     Status: Abnormal   Collection Time: 08/15/22  3:03 PM   Specimen: Urine, Random  Result Value Ref Range Status   Specimen Description URINE, RANDOM  Final   Special Requests NONE Reflexed from [id number]  Final   Culture (A)  Final    >=100,000 COLONIES/mL KLEBSIELLA PNEUMONIAE Confirmed Extended Spectrum Beta-Lactamase Producer (ESBL).  In bloodstream infections from ESBL organisms, carbapenems are preferred over piperacillin/tazobactam. They are shown to have a lower risk of mortality. MULTI-DRUG RESISTANT ORGANISM CRITICAL RESULT CALLED TO, READ BACK BY AND VERIFIED WITH: RN [facility] Brooke Dare [phone] BY Berline Chough, MT Performed at [facility], [address] [place], [place] [address]    Report Status 08/17/2022 FINAL  Final   Organism ID, Bacteria KLEBSIELLA PNEUMONIAE (A)  Final      Susceptibility   Klebsiella pneumoniae - MIC*    AMPICILLIN >=32 RESISTANT Resistant     CEFAZOLIN >=64 RESISTANT Resistant     CEFEPIME >=32 RESISTANT Resistant     CEFTRIAXONE >=64 RESISTANT Resistant     CIPROFLOXACIN <=0.25 SENSITIVE Sensitive     GENTAMICIN >=16 RESISTANT Resistant     IMIPENEM <=0.25 SENSITIVE Sensitive     NITROFURANTOIN 128 RESISTANT Resistant     TRIMETH/SULFA >=320 RESISTANT Resistant      AMPICILLIN/SULBACTAM >=32 RESISTANT Resistant     PIP/TAZO <=4 SENSITIVE Sensitive     * >=100,000 COLONIES/mL KLEBSIELLA PNEUMONIAE  Respiratory (~20 pathogens) panel by PCR     Status: None   Collection Time: 08/15/22  5:25 PM   Specimen: Nasopharyngeal Swab; Respiratory  Result Value Ref Range Status   Adenovirus NOT DETECTED NOT DETECTED Final   Coronavirus 229E NOT DETECTED NOT DETECTED Final    Comment: (NOTE) The Coronavirus on the Respiratory Panel, DOES NOT test for the novel  Coronavirus (2019 nCoV)    Coronavirus HKU1 NOT DETECTED NOT DETECTED Final   Coronavirus NL63 NOT DETECTED NOT DETECTED Final   Coronavirus OC43 NOT DETECTED NOT DETECTED Final   Metapneumovirus NOT DETECTED NOT DETECTED Final   Rhinovirus / Enterovirus NOT DETECTED NOT DETECTED Final   Influenza A NOT DETECTED NOT DETECTED Final   Influenza B NOT DETECTED NOT DETECTED Final   Parainfluenza Virus 1 NOT DETECTED NOT DETECTED Final   Parainfluenza Virus 2 NOT DETECTED NOT DETECTED Final   Parainfluenza Virus 3 NOT DETECTED NOT DETECTED Final   Parainfluenza Virus 4 NOT DETECTED NOT DETECTED Final   Respiratory Syncytial Virus NOT DETECTED NOT DETECTED Final   Bordetella pertussis NOT DETECTED NOT DETECTED Final   Bordetella Parapertussis NOT DETECTED NOT DETECTED Final   Chlamydophila pneumoniae NOT DETECTED NOT DETECTED Final   Mycoplasma pneumoniae NOT DETECTED NOT DETECTED Final    Comment: Performed at [facility], [address] [place], [place] [address]  SARS Coronavirus 2 by RT PCR (hospital order, performed in [facility] hospital lab) *cepheid single result test* Anterior Nasal Swab     Status: None   Collection Time: 08/15/22  5:25 PM   Specimen: Anterior Nasal Swab  Result Value Ref Range Status   SARS Coronavirus 2 by RT PCR NEGATIVE NEGATIVE Final    Comment: Performed at [facility], [address] [place], [place] [address]    Antimicrobials: Anti-infectives (From  admission, onward)    Start     Dose/Rate Route Frequency Ordered Stop   08/19/22 1800  levofloxacin (LEVAQUIN) tablet 250 mg       See Hyperspace for full Linked Orders Report.   250 mg Oral Every 48 hours 08/17/22 1124 08/19/22 1723   08/17/22 1200  levofloxacin (LEVAQUIN) tablet 250 mg  Status:  Discontinued        250 mg Oral Daily 08/17/22 1113 08/17/22 1123   08/17/22 1200  levofloxacin (LEVAQUIN) tablet 500 mg       See Hyperspace for full Linked Orders Report.   500 mg Oral  Once 08/17/22 1124 08/17/22 1537   08/15/22 1415  cefTRIAXone (ROCEPHIN) 1 g in sodium chloride 0.9 % 100 mL IVPB  Status:  Discontinued        1 g 200 mL/hr over 30 Minutes Intravenous Every 24 hours 08/15/22 1315 08/17/22 1113   08/15/22 1415  azithromycin (ZITHROMAX) 500 mg in sodium chloride 0.9 % 250 mL IVPB  Status:  Discontinued        500 mg 250 mL/hr over 60 Minutes Intravenous Every 24 hours 08/15/22 1315 08/15/22 1322   08/15/22 1415  azithromycin (ZITHROMAX) tablet 500 mg  Status:  Discontinued        500 mg Oral Daily 08/15/22 1322 08/17/22 1113   08/14/22 0600  cefUROXime (ZINACEF) 1.5 g in sodium chloride 0.9 % 100 mL IVPB        1.5 g 200 mL/hr over 30 Minutes Intravenous On call to O.R. 08/13/22 1243 08/14/22 1335     Culture/Microbiology    Component Value Date/Time   SDES URINE, RANDOM 08/15/2022 1503   SPECREQUEST NONE Reflexed from [id number] 08/15/2022 1503   CULT (A) 08/15/2022 1503    >=100,000 COLONIES/mL KLEBSIELLA PNEUMONIAE Confirmed Extended Spectrum Beta-Lactamase Producer (ESBL).  In bloodstream infections from ESBL organisms, carbapenems are preferred over piperacillin/tazobactam. They are shown to have a lower risk of mortality. MULTI-DRUG RESISTANT ORGANISM CRITICAL RESULT CALLED TO, READ BACK BY AND VERIFIED WITH: RN [facility] Brooke Dare [phone] BY Berline Chough, MT Performed at [facility], [address] [place], [place] [address]    REPTSTATUS 08/17/2022 FINAL 08/15/2022  1503   Radiology Studies: DG CHEST PORT 1 VIEW  Result Date: 08/19/2022 CLINICAL DATA:  Pulmonary edema. EXAM: PORTABLE CHEST 1 VIEW COMPARISON:  08/18/2022 and prior studies FINDINGS: The cardiomediastinal silhouette is unchanged. A RIGHT IJ central venous catheter tip overlying the SUPERIOR vena cava again noted. Bilateral airspace opacities, bilateral pleural effusions and bibasilar atelectasis again noted. There is no evidence of pneumothorax. IMPRESSION: Unchanged appearance of the chest with bilateral airspace opacities/edema, bilateral pleural effusions and bibasilar atelectasis. Electronically Signed   By: Harmon Pier M.D.   On: 08/19/2022 14:34    LOS: 7 days  Lanae Boast, MD  [facility] 08/20/2022, 8:32 AM

## 2022-08-20 NOTE — Progress Notes (Signed)
New Dialysis Start    Patient identified as new dialysis start. Kidney Education packet assembled and given. Discussed the following items with patient:     Current medications and possible changes once started:  Discussed that patient's medications may change over time.  Ex; hypertension medications and diabetes medication.  Nephrologists will adjust as needed.   Fluid restrictions reviewed:  32 oz daily goal:  All liquids count; soups, ice, jello, fruits. Will also refer dietitian.   Phosphorus and potassium: Handout given showing high potassium and phosphorus foods.  Alternative food and drink options given. Will also refer dietitian.   Family support:  Patient stated that she will have support and transportation for dialysis is being set up.   Outpatient Clinic Resources:  Discussed roles of Outpatient clinic staff and advised to make a list of needs, if any, to talk with outpatient staff if needed.   Care plan schedule: Informed patient of Care Plans in outpatient setting and to participate in the care plan.  An invitation would be given from outpatient clinic.    Dialysis Access Options:  Reviewed access options with patients. Discussed in detail about care at home with new AVG & AVF. Reviewed checking bruit and thrill. If dialysis catheter present, educated that patient could not take showers.  Catheter dressing changes were to be done by outpatient clinic staff only   Home therapy options:  Educated patient about home therapy options:  PD vs home hemo.     Patient verbalized understanding. Will continue to round on patient during admission.  Patient eager for discharge and noted that she is ready for this drastic change in her life. Encouraged patient that she will have a small community environment in outpatient dialysis with the staff and other patients, she is looking forward to meeting new people.  Jean Rosenthal Dialysis Nurse Coordinator [phone]

## 2022-08-20 NOTE — TOC Progression Note (Signed)
Transition of Care [facility]) - Progression Note    Patient Details  Name: ABYSSINIA BIELAWSKI MRN: [id number] Date of Birth: 09/23/1960  [facility] CM/SW Contact  Leander Rams, LCSW Phone Number: 08/20/2022, 3:45 PM  Clinical Narrative:    CSW met with pt to go over [employer] [place] application that had already been completed. CSW faxed and emailed [employer] [place] application for further review. Pt reports she does have transportation to her HD appointment this Thursday.   TOC following.    Expected Discharge Plan: Home/Self Care Barriers to Discharge: Inadequate or no insurance, Transportation  Expected Discharge Plan and Services In-house Referral: NA Discharge Planning Services: CM Consult Post Acute Care Choice: NA Living arrangements for the past 2 months: Hotel/Motel Expected Discharge Date: 08/20/22               DME Arranged: N/A DME Agency: NA       HH Arranged: NA           Social Determinants of Health (SDOH) Interventions SDOH Screenings   Food Insecurity: No Food Insecurity (08/13/2022)  Housing: Patient Unable To Answer (08/13/2022)  Transportation Needs: No Transportation Needs (08/13/2022)  Recent Concern: Transportation Needs - Unmet Transportation Needs (08/02/2022)  Utilities: Not At Risk (08/13/2022)  Depression (PHQ2-9): Medium Risk (01/21/2022)  Tobacco Use: High Risk (08/19/2022)    Readmission Risk Interventions    08/13/2022    4:47 PM 08/02/2022    1:56 PM 11/16/2021   12:16 PM  Readmission Risk Prevention Plan  Transportation Screening Complete Complete Complete  PCP or Specialist Appt within 3-5 Days   Complete  HRI or Home Care Consult   Complete  Social Work Consult for Recovery Care Planning/Counseling   Complete  Palliative Care Screening   Not Applicable  Medication Review Oceanographer) Complete Referral to Pharmacy Complete  PCP or Specialist appointment within 3-5 days of discharge Complete Complete   HRI or Home Care Consult  Complete Complete   SW Recovery Care/Counseling Consult  Complete   Palliative Care Screening Not Applicable Not Applicable   Skilled Nursing Facility Not Applicable Not Applicable    Oletta Lamas, MSW, [place], LCASA Transitions of Care  Clinical Social Worker I

## 2022-08-20 NOTE — Discharge Summary (Signed)
Physician Discharge Summary  LATEISHA DEITZ ZHY:[id number] DOB: February 03, 1961 DOA: 08/12/2022  PCP: [name], MD  Admit date: 08/12/2022 Discharge date: 08/20/2022 Recommendations for Outpatient Follow-up:  Follow up with PCP in 1 weeks-call for appointment Please obtain BMP/CBC in one week  Discharge Dispo: HOME Discharge Condition: Stable Code Status:   Code Status: DNR Diet recommendation:  Diet Order             Diet renal/carb modified with fluid restriction Diet-HS Snack? Nothing; Fluid restriction: 1500 mL Fluid; Room service appropriate? Yes; Fluid consistency: Thin  Diet effective now                    Brief/Interim Summary: 52 yof w/ for social situation, PAD GERD gastritis, hypertension, anemia, CKD 5, COPD, hepatitis C, Hyperlipidemia, failure to thrive, recent admission 5/16-5/20 for symptomatic anemia due to chronic GI bleeding needing 2 units PRBC, FOBT was negative, presented with fluid retention shortness of breath,hypoxa at 80%, anasarca in legs arms and abdomen  In the ED mildly tachycardic, he did not with nasal cannula, BP on higher side 160s to 170s mild tachypnea-24 Labs creatinine elevated 4.19 hemoglobin 7.1 g> 7.0 g, lactic acid 2.1> 0.8, BNP elevated 1084. Chest x-ray cardiomegaly and pulmonary edema with diffuse bilateral interstitial pulmonary opacities right greater than left small pleural effusion.  Patient was admitted for fluid overload, anemia acute hypoxic respiratory failure  Since being managed with diuretics for CHF, for COPD exacerbation and fluid overload in the setting of CKD Transfused 1 unit PRBC for anemia Seen by nephrology and critical care needed HFNC up to 8 L Palliative care consulted 5/30  S/p SBE BY gi :5/31: 9 AVMs noted. 1 with evidence of recent stigmata of bleeding.  GI advised to continue PPI and outpatient follow-up She has been initiated on dialysis DAY 1 ON 5/29 after TDC by IR. Volume status has nicely improved  tolerating dialysis as D being set up for outpatient Overall she feels improved. She is approved for [facility] [place] on TTS 12:00 chair time. pt can start on Thursday and will need to arrive at 11:00 for paperwork.  Discussed with nephrology okay for discharge home she will go on 80 of Lasix on Monday Wednesday Friday on non HD day   Discharge Diagnoses:  Principal Problem:   Acute on chronic diastolic CHF (congestive heart failure) (HCC) Active Problems:   Chronic hepatitis C without hepatic coma (HCC)   HLD (hyperlipidemia)   Hypertension   Chronic leg pain   Anxiety and depression   Gastroesophageal reflux disease   IDA (iron deficiency anemia)   Chronic obstructive pulmonary disease (HCC)   Polysubstance abuse (HCC)   Gastritis and gastroduodenitis   PUD (peptic ulcer disease)   CKD (chronic kidney disease) stage 5, GFR less than 15 ml/min (HCC)   Acute respiratory failure with hypoxia (HCC)   Heme positive stool   Acute on chronic anemia   Hypoxia   Acute on chronic congestive heart failure (HCC)   Pleural effusion   COPD exacerbation (HCC)  Acute hypoxic respiratory failure Acute pulmonary edema Bilateral pleural effusion in the setting of fluid overload Acute on chronic diastolic CHF and fluid overload due to ESRD COPD and concern for pneumonia: Shortness of breath hypoxia multifactorial in the setting of fluid overload primarily due to patient's CKD now progressed to ESRD.  Also component of COPD CHF and pneumonia. BNP elevated 1084-Echo showed EF 55 to 60% and G1 DD, imaging with  pulmonary edema pleural effusion cardiomegaly>Managed with antibiotics diuretics, followed  by nephrology critical care.HD initiated 5/29.CAP coverage added 5/30 per PCCM> antibiotic adjusted to levaquin 6/1 for UTI. Weaned to room air this morning doing well ambulate on RA  Progressive CKD stage V-now progressing to ESRD: Now on HD,Nephrology following TDC placed and first HD 5/29. S/P hd  5/31.6/2.Patient has had very strong emotional reaction to starting HD nephrology has advised to give it a try for a few weeks before further decision. Treatment #3  6/3. Social worker following for outpatient HD set up>She is approved for [employer] [place] on TTS 12:00 chair time. pt can start on Thursday and will need to arrive at 11:00 for paperwork.  Discussed with nephrology okay for discharge home she will go on 80 of Lasix on Monday Wednesday Friday on non HD day   Metabolic acidosis in the setting of CKD/ESRD, resolved  Suspected Community acquired pneumonia Acute COPD exacerbation Current active smoker Pleural effusion B/L: Continue on prednisone, Brovana Pulmicort bronchodilators appreciate pulmonary input on board .  Continue pneumonia coverage but given ESBL Klebsiella changed Rocephin/azithromycin to Levaquin 6/1, continue supplemental oxygen and wean.  Repeat chest x-ray 6/3 unchanged hyperaeration of bilateral airspace opacities/edema, bilateral pleural effusion and bibasilar atelectasis.  No plan for thoracentesis for now- plan per PCCM  ESBL Klebsiella UTI Completed levofloxacin renal dosing.    Anemia multifactorial likely from anemia of chronic kidney disease PUD/gastritis/GERD and diverticulosis FOBT+ Acute blood loss anemia due to GI bleeding from AVM s/p 1 unit PRBC -anemia panel done recently with low iron storage ON EPO/ IV Iron per nephro. GI consulted-S/p SBE:5/31: 9 AVMs noted. 1 with evidence of recent stigmata of bleeding.  Continue PPI and outpatient follow-up with GI.  GI signed off.   Recent Labs  Lab 08/15/22 0104 08/16/22 0036 08/17/22 0044 08/18/22 0105 08/19/22 0845  HGB 7.3* 8.3* 8.5* 8.4* 8.8*  HCT 23.4* 26.3* 26.2* 26.9* 29.0*   HTN:BP on higher side allow higher bp to allow diuresis and HD.Holding amlodipine.    Substance ZOX:WRUEAVW currently using marijuana only.  Depression/Anxiety:Mood is stable ,continue home BuSpar and  trazodone  Neuropathy:Continue home Gabapentin, she also takes Percocet 1 every 8 hours at home.   Debility deconditioning complex medical comorbidities/noncompliance: GOC: Palliative care consultation has been appreciated they had discussed with patient and the daughter, she has been changed to DNR, ongoing further discussion about GOC    Consults: Pccm, nephrology Subjective: Aaox3, she is eager to go home today  Discharge Exam: Vitals:   08/20/22 0837 08/20/22 0838  BP:    Pulse:    Resp:    Temp:    SpO2: 96% 99%   General: Pt is alert, awake, not in acute distress Cardiovascular: RRR, S1/S2 +, no rubs, no gallops Respiratory: CTA bilaterally, no wheezing, no rhonchi Abdominal: Soft, NT, ND, bowel sounds + Extremities: no edema, no cyanosis  Discharge Instructions  Discharge Instructions     Discharge instructions   Complete by: As directed    Please call call MD or return to ER for similar or worsening recurring problem that brought you to hospital or if any fever,nausea/vomiting,abdominal pain, uncontrolled pain, chest pain,  shortness of breath or any other alarming symptoms.  Please follow-up your doctor as instructed in a week time and call the office for appointment.  Please avoid alcohol, smoking, or any other illicit substance and maintain healthy habits including taking your regular medications as prescribed.  You were cared for by  a hospitalist during your hospital stay. If you have any questions about your discharge medications or the care you received while you were in the hospital after you are discharged, you can call the unit and ask to speak with the hospitalist on call if the hospitalist that took care of you is not available.  Once you are discharged, your primary care physician will handle any further medical issues. Please note that NO REFILLS for any discharge medications will be authorized once you are discharged, as it is imperative that you return  to your primary care physician (or establish a relationship with a primary care physician if you do not have one) for your aftercare needs so that they can reassess your need for medications and monitor your lab values   Increase activity slowly   Complete by: As directed    No wound care   Complete by: As directed       Allergies as of 08/20/2022       Reactions   Zestril [lisinopril] Other (See Comments)   Hyperkalemia        Medication List     STOP taking these medications    amLODipine 10 MG tablet Commonly known as: NORVASC       TAKE these medications    acetaminophen 500 MG tablet Commonly known as: TYLENOL Take 1,000 mg by mouth 2 (two) times daily as needed for moderate pain, fever or headache.   albuterol 108 (90 Base) MCG/ACT inhaler Commonly known as: VENTOLIN HFA INHALE 2 PUFFS INTO THE LUNGS EVERY 6 HOURS AS NEEDED FOR WHEEZING OR SHORTNESS OF BREATH.   busPIRone 10 MG tablet Commonly known as: BUSPAR Take 1 tablet (10 mg total) by mouth 2 (two) times daily.   FeroSul 325 (65 FE) MG tablet Generic drug: ferrous sulfate Take 1 tablet (325 mg total) by mouth daily with breakfast.   furosemide 80 MG tablet Commonly known as: Lasix Take 1 tablet (80 mg total) by mouth every Monday, Wednesday, and Friday. Start taking on: August 21, 2022   gabapentin 100 MG capsule Commonly known as: NEURONTIN Take 1 capsule (100 mg total) by mouth 3 (three) times daily.   ipratropium-albuterol 0.5-2.5 (3) MG/3ML Soln Commonly known as: DUONEB Take 3 mLs by nebulization every 6 (six) hours as needed (shortness of breath and wheezing). What changed: reasons to take this   multivitamin with minerals Tabs tablet Take 1 tablet by mouth daily.   oxyCODONE-acetaminophen 5-325 MG tablet Commonly known as: PERCOCET/ROXICET Take 1 tablet by mouth every 8 (eight) hours as needed for severe pain or moderate pain.   pantoprazole 40 MG tablet Commonly known as:  PROTONIX Take 1 tablet (40 mg total) by mouth daily.   predniSONE 10 MG tablet Commonly known as: DELTASONE 3 tabs daily x 2 days, 2 tabs daily x 3 days,1 tab daily x 3 days then stop.   sodium bicarbonate 650 MG tablet Take 2 tablets (1,300 mg total) by mouth 3 (three) times daily.   sucralfate 1 g tablet Commonly known as: CARAFATE Take 1 tablet (1 g total) by mouth 2 (two) times daily for 7 days.   traZODone 50 MG tablet Commonly known as: DESYREL Take 1 tablet (50 mg total) by mouth at bedtime as needed for sleep.   Trelegy Ellipta 100-62.5-25 MCG/ACT Aepb Generic drug: Fluticasone-Umeclidin-Vilant Inhale 1 puff into the lungs daily.        Follow-up Information     [facility]  Go on 08/28/2022.   Specialty: Internal Medicine Why: @9 :30am Contact information: [address], [address] [id number] mc [place] [address] [phone]        [facility] Follow up in 6 week(s).   Specialty: Vascular Surgery Why: Office will call you to arrange your appt (sent). Contact information: [address] [place] [address] [phone]               Allergies  Allergen Reactions   Zestril [Lisinopril] Other (See Comments)    Hyperkalemia    The results of significant diagnostics from this hospitalization (including imaging, microbiology, ancillary and laboratory) are listed below for reference.    Microbiology: Recent Results (from the past 240 hour(s))  Culture, blood (Routine x 2)     Status: None   Collection Time: 08/12/22  5:08 PM   Specimen: BLOOD RIGHT ARM  Result Value Ref Range Status   Specimen Description BLOOD RIGHT ARM  Final   Special Requests   Final    BOTTLES DRAWN AEROBIC AND ANAEROBIC Blood Culture results may not be optimal due to an inadequate volume of blood received in culture bottles   Culture   Final    NO GROWTH 5  DAYS Performed at [facility], [address], [place], [place] [address]    Report Status 08/17/2022 FINAL  Final  Culture, blood (Routine x 2)     Status: None   Collection Time: 08/12/22  5:13 PM   Specimen: BLOOD  Result Value Ref Range Status   Specimen Description BLOOD LEFT ANTECUBITAL  Final   Special Requests   Final    BOTTLES DRAWN AEROBIC AND ANAEROBIC Blood Culture adequate volume   Culture   Final    NO GROWTH 5 DAYS Performed at [facility], [address], [place], [place] [address]    Report Status 08/17/2022 FINAL  Final  Urine Culture     Status: Abnormal   Collection Time: 08/15/22  3:03 PM   Specimen: Urine, Random  Result Value Ref Range Status   Specimen Description URINE, RANDOM  Final   Special Requests NONE Reflexed from [id number]  Final   Culture (A)  Final    >=100,000 COLONIES/mL KLEBSIELLA PNEUMONIAE Confirmed Extended Spectrum Beta-Lactamase Producer (ESBL).  In bloodstream infections from ESBL organisms, carbapenems are preferred over piperacillin/tazobactam. They are shown to have a lower risk of mortality. MULTI-DRUG RESISTANT ORGANISM CRITICAL RESULT CALLED TO, READ BACK BY AND VERIFIED WITH: RN [facility] Brooke Dare [phone] BY Berline Chough, [place] Performed at [facility], [address] [place], [place] [address]    Report Status 08/17/2022 FINAL  Final   Organism ID, Bacteria KLEBSIELLA PNEUMONIAE (A)  Final      Susceptibility   Klebsiella pneumoniae - MIC*    AMPICILLIN >=32 RESISTANT Resistant     CEFAZOLIN >=64 RESISTANT Resistant     CEFEPIME >=32 RESISTANT Resistant     CEFTRIAXONE >=64 RESISTANT Resistant     CIPROFLOXACIN <=0.25 SENSITIVE Sensitive     GENTAMICIN >=16 RESISTANT Resistant     IMIPENEM <=0.25 SENSITIVE Sensitive     NITROFURANTOIN 128 RESISTANT Resistant     TRIMETH/SULFA >=320 RESISTANT Resistant     AMPICILLIN/SULBACTAM >=32 RESISTANT Resistant     PIP/TAZO <=4 SENSITIVE Sensitive     * >=100,000  COLONIES/mL KLEBSIELLA PNEUMONIAE  Respiratory (~20 pathogens) panel by PCR     Status: None   Collection  Time: 08/15/22  5:25 PM   Specimen: Nasopharyngeal Swab; Respiratory  Result Value Ref Range Status   Adenovirus NOT DETECTED NOT DETECTED Final   Coronavirus 229E NOT DETECTED NOT DETECTED Final    Comment: (NOTE) The Coronavirus on the Respiratory Panel, DOES NOT test for the novel  Coronavirus (2019 nCoV)    Coronavirus HKU1 NOT DETECTED NOT DETECTED Final   Coronavirus NL63 NOT DETECTED NOT DETECTED Final   Coronavirus OC43 NOT DETECTED NOT DETECTED Final   Metapneumovirus NOT DETECTED NOT DETECTED Final   Rhinovirus / Enterovirus NOT DETECTED NOT DETECTED Final   Influenza A NOT DETECTED NOT DETECTED Final   Influenza B NOT DETECTED NOT DETECTED Final   Parainfluenza Virus 1 NOT DETECTED NOT DETECTED Final   Parainfluenza Virus 2 NOT DETECTED NOT DETECTED Final   Parainfluenza Virus 3 NOT DETECTED NOT DETECTED Final   Parainfluenza Virus 4 NOT DETECTED NOT DETECTED Final   Respiratory Syncytial Virus NOT DETECTED NOT DETECTED Final   Bordetella pertussis NOT DETECTED NOT DETECTED Final   Bordetella Parapertussis NOT DETECTED NOT DETECTED Final   Chlamydophila pneumoniae NOT DETECTED NOT DETECTED Final   Mycoplasma pneumoniae NOT DETECTED NOT DETECTED Final    Comment: Performed at [facility], [address] [place], [place] [address]  SARS Coronavirus 2 by RT PCR (hospital order, performed in [facility] hospital lab) *cepheid single result test* Anterior Nasal Swab     Status: None   Collection Time: 08/15/22  5:25 PM   Specimen: Anterior Nasal Swab  Result Value Ref Range Status   SARS Coronavirus 2 by RT PCR NEGATIVE NEGATIVE Final    Comment: Performed at [facility], [address], [place], [place] [address]    Procedures/Studies: [place] CHEST PORT 1 VIEW  Result Date: 08/19/2022 CLINICAL DATA:  Pulmonary edema. EXAM: PORTABLE CHEST 1 VIEW COMPARISON:   08/18/2022 and prior studies FINDINGS: The cardiomediastinal silhouette is unchanged. A RIGHT IJ central venous catheter tip overlying the SUPERIOR vena cava again noted. Bilateral airspace opacities, bilateral pleural effusions and bibasilar atelectasis again noted. There is no evidence of pneumothorax. IMPRESSION: Unchanged appearance of the chest with bilateral airspace opacities/edema, bilateral pleural effusions and bibasilar atelectasis. Electronically Signed   By: Harmon Pier M.D.   On: 08/19/2022 14:34   DG Chest Port 1 View  Result Date: 08/18/2022 CLINICAL DATA:  Pneumonia EXAM: PORTABLE CHEST 1 VIEW COMPARISON:  August 17, 2022 FINDINGS: Stable hemodialysis catheter. No pneumothorax. Stable cardiomediastinal silhouette. Stable bilateral pleural effusions and pulmonary edema. IMPRESSION: Stable pleural effusions and pulmonary edema. Electronically Signed   By: Gerome Sam III M.D.   On: 08/18/2022 11:54   DG Chest Port 1 View  Result Date: 08/17/2022 CLINICAL DATA:  Acute on chronic congestive heart failure. Pulmonary edema. EXAM: PORTABLE CHEST 1 VIEW COMPARISON:  Prior chest x-ray 08/15/2022 FINDINGS: Right IJ tunneled hemodialysis catheter in good position. The catheter tip overlies the mid SVC. Moderate cardiomegaly. Pulmonary vascular congestion with diffuse interstitial prominence and peripheral Kerley B-lines consistent with pulmonary edema. Small to moderate bilateral layering pleural effusions. No pneumothorax. No acute osseous abnormality. IMPRESSION: Moderate CHF with small to moderate bilateral layering pleural effusions. Well-positioned tunneled right IJ hemodialysis catheter. Electronically Signed   By: Malachy Moan M.D.   On: 08/17/2022 09:31   DG Chest Port 1 View  Result Date: 08/15/2022 CLINICAL DATA:  Hypoxia.  Cough and fever. EXAM: PORTABLE CHEST 1 VIEW COMPARISON:  Aug 14, 2022. FINDINGS: Diffuse interstitial  and patchy airspace opacities. No visible pneumothorax. Hazy  bibasilar opacities likely are in part due to layering pleural effusions. Enlarged cardiac silhouette. Pulmonary vascular congestion. Right IJ approach central venous catheter with the tip projecting at the superior right atrium. IMPRESSION: 1. Cardiomegaly, pulmonary vascular congestion diffuse interstitial airspace opacities, probable layering pleural effusions, suspicious for pulmonary edema. Findings appear mildly worsened compared to May 29, 24. 2. Superimposed infection is not excluded. Electronically Signed   By: Feliberto Harts M.D.   On: 08/15/2022 11:20   DG CHEST PORT 1 VIEW  Result Date: 08/14/2022 CLINICAL DATA:  Status post dialysis catheter insertion. EXAM: PORTABLE CHEST 1 VIEW COMPARISON:  08/12/2022 FINDINGS: Right jugular dialysis catheter has been placed. The catheter tip is in the upper SVC region. Negative for pneumothorax. Patchy airspace densities in both lungs are concerning for pulmonary edema. Increased densities in the retrocardiac space could represent pleural fluid or atelectasis. Heart size is mildly enlarged. IMPRESSION: 1. Placement of right jugular dialysis catheter with the catheter tip in the SVC region. Negative for pneumothorax. 2. Patchy airspace densities in both lungs are concerning for pulmonary edema. 3. Increased densities in the retrocardiac space could represent pleural fluid or atelectasis. Electronically Signed   By: Richarda Overlie M.D.   On: 08/14/2022 13:39   DG C-Arm 1-60 Min-No Report  Result Date: 08/14/2022 Fluoroscopy was utilized by the requesting physician.  No radiographic interpretation.   ECHOCARDIOGRAM COMPLETE  Result Date: 08/13/2022    ECHOCARDIOGRAM REPORT   Patient Name:   MCKENZYE MACKOWSKI Villers Date of Exam: 08/13/2022 Medical Rec #:  [id number]      Height:       67.0 in Accession #:    [id number]     Weight:       139.5 lb Date of Birth:  12-17-1960      BSA:          1.735 m Patient Age:    61 years       BP:           159/93 mmHg Patient Gender:  F              HR:           87 bpm. 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 CHF, COPD, Signs/Symptoms:Shortness of Breath                 and Dyspnea; Risk Factors:Hypertension and Current Smoker. IVDU.                 ETOH.  Sonographer:    Sheralyn Boatman RDCS Referring Phys: [id number] [name] MELVIN IMPRESSIONS  1. Left ventricular ejection fraction, by estimation, is 55 to 60%. The left ventricle has normal function. The left ventricle has no regional wall motion abnormalities. Left ventricular diastolic parameters are consistent with Grade I diastolic dysfunction (impaired relaxation).  2. Right ventricular systolic function is normal. The right ventricular size is normal. There is mildly elevated pulmonary artery systolic pressure.  3. The mitral valve is degenerative. Mild mitral valve regurgitation.  4. The aortic  valve is tricuspid. Aortic valve regurgitation is not visualized. Mild aortic valve stenosis. Aortic valve mean gradient measures 12.5 mmHg. Aortic valve Vmax measures 2.37 m/s.  5. The inferior vena cava is dilated in size with <50% respiratory variability, suggesting right atrial pressure of 15 mmHg. Comparison(s): No significant change from prior study. FINDINGS  Left Ventricle: Left ventricular ejection fraction, by estimation, is 55 to 60%. The left ventricle has normal function. The left ventricle has no regional wall motion abnormalities. The left ventricular internal cavity size was normal in size. There is  no left ventricular hypertrophy. Left ventricular diastolic parameters are consistent with Grade I diastolic dysfunction (impaired relaxation). Right Ventricle: The right ventricular size is normal. No increase in  right ventricular wall thickness. Right ventricular systolic function is normal. There is mildly elevated pulmonary artery systolic pressure. The tricuspid regurgitant velocity is 2.65  m/s, and with an assumed right atrial pressure of 15 mmHg, the estimated right ventricular systolic pressure is 43.1 mmHg. Left Atrium: Left atrial size was normal in size. Right Atrium: Right atrial size was normal in size. Pericardium: There is no evidence of pericardial effusion. Mitral Valve: The mitral valve is degenerative in appearance. Mild mitral valve regurgitation. MV peak gradient, 10.0 mmHg. The mean mitral valve gradient is 5.0 mmHg. Tricuspid Valve: The tricuspid valve is grossly normal. Tricuspid valve regurgitation is trivial. No evidence of tricuspid stenosis. Aortic Valve: The aortic valve is tricuspid. Aortic valve regurgitation is not visualized. Mild aortic stenosis is present. Aortic valve mean gradient measures 12.5 mmHg. Aortic valve peak gradient measures 22.5 mmHg. Aortic valve area, by VTI measures 1.40 cm. Pulmonic Valve: The pulmonic valve was grossly normal. Pulmonic valve regurgitation is not visualized. No evidence of pulmonic stenosis. Aorta: The aortic root and ascending aorta are structurally normal, with no evidence of dilitation. Venous: The inferior vena cava is dilated in size with less than 50% respiratory variability, suggesting right atrial pressure of 15 mmHg. IAS/Shunts: The atrial septum is grossly normal.  LEFT VENTRICLE PLAX 2D LVIDd:         5.00 cm      Diastology LVIDs:         3.70 cm      LV e' medial:    6.96 cm/s LV PW:         1.20 cm      LV E/e' medial:  20.8 LV IVS:        1.00 cm      LV e' lateral:   6.90 cm/s LVOT diam:     1.80 cm      LV E/e' lateral: 21.0 LV SV:         58 LV SV Index:   34 LVOT Area:     2.54 cm  LV Volumes (MOD) LV vol d, MOD A2C: 127.5 ml LV vol d, MOD A4C: 104.0 ml LV vol s, MOD A2C: 59.0 ml LV vol s, MOD A4C: 45.1 ml LV SV MOD A2C:     68.5 ml LV  SV MOD A4C:     104.0 ml LV SV MOD BP:      64.2 ml RIGHT VENTRICLE             IVC RV S prime:     12.50 cm/s  IVC diam: 2.30 cm TAPSE (M-mode): 2.5 cm LEFT ATRIUM             Index        RIGHT ATRIUM  Index LA diam:        3.90 cm 2.25 cm/m   RA Area:     13.20 cm LA Vol (A2C):   35.3 ml 20.34 ml/m  RA Volume:   33.60 ml  19.36 ml/m LA Vol (A4C):   25.0 ml 14.41 ml/m LA Biplane Vol: 32.2 ml 18.56 ml/m  AORTIC VALVE AV Area (Vmax):    1.42 cm AV Area (Vmean):   1.36 cm AV Area (VTI):     1.40 cm AV Vmax:           237.00 cm/s AV Vmean:          163.500 cm/s AV VTI:            0.416 m AV Peak Grad:      22.5 mmHg AV Mean Grad:      12.5 mmHg LVOT Vmax:         132.00 cm/s LVOT Vmean:        87.300 cm/s LVOT VTI:          0.229 m LVOT/AV VTI ratio: 0.55  AORTA Ao Root diam: 3.20 cm Ao Asc diam:  3.10 cm MITRAL VALVE                  TRICUSPID VALVE MV Area (PHT): 3.85 cm       TR Peak grad:   28.1 mmHg MV Area VTI:   1.39 cm       TR Vmax:        265.00 cm/s MV Peak grad:  10.0 mmHg MV Mean grad:  5.0 mmHg       SHUNTS MV Vmax:       1.58 m/s       Systemic VTI:  0.23 m MV Vmean:      107.0 cm/s     Systemic Diam: 1.80 cm MV Decel Time: 197 msec MR Peak grad:    91.4 mmHg MR Mean grad:    73.0 mmHg MR Vmax:         478.00 cm/s MR Vmean:        417.0 cm/s MR PISA:         2.26 cm MR PISA Eff ROA: 18 mm MR PISA Radius:  0.60 cm MV E velocity: 145.00 cm/s MV A velocity: 136.00 cm/s MV E/A ratio:  1.07 Lennie Odor MD Electronically signed by Lennie Odor MD Signature Date/Time: 08/13/2022/12:42:37 PM    Final    DG Chest 2 View  Result Date: 08/12/2022 CLINICAL DATA:  Suspected sepsis, fluid retention, shortness of breath, anasarca, COPD, emphysema EXAM: CHEST - 2 VIEW COMPARISON:  08/01/2022 FINDINGS: Cardiomegaly. Diffuse bilateral interstitial pulmonary opacity and right-greater-than-left small pleural effusions. Disc degenerative disease of the thoracic spine. IMPRESSION: Cardiomegaly  with diffuse bilateral interstitial pulmonary opacity and right-greater-than-left small pleural effusions, consistent with edema. No focal airspace opacity. Electronically Signed   By: Jearld Lesch M.D.   On: 08/12/2022 18:12   VAS [place] LOWER EXTREMITY VENOUS (DVT)  Result Date: 08/02/2022  Lower Venous DVT Study Patient Name:  KHAMANI GOLUBSKI Loredo  Date of Exam:   08/02/2022 Medical Rec #: [id number]       Accession #:    [id number] Date of Birth: Apr 12, 1960       Patient Gender: F Patient Age:   52 years Exam Location:  [facility] Procedure:      VAS [place] LOWER EXTREMITY VENOUS (DVT) Referring Phys: Enid Derry HALL --------------------------------------------------------------------------------  Indications: Edema, and Swelling.  Other Indications: Advanced CKD stage IV. Comparison Study: No priors. Performing Technologist: Marilynne Halsted RDMS, RVT  Examination Guidelines: A complete evaluation includes B-mode imaging, spectral Doppler, color Doppler, and power Doppler as needed of all accessible portions of each vessel. Bilateral testing is considered an integral part of a complete examination. Limited examinations for reoccurring indications may be performed as noted. The reflux portion of the exam is performed with the patient in reverse Trendelenburg.  +---------+---------------+---------+-----------+----------+--------------+ RIGHT    CompressibilityPhasicitySpontaneityPropertiesThrombus Aging +---------+---------------+---------+-----------+----------+--------------+ CFV      Full           Yes      Yes                                 +---------+---------------+---------+-----------+----------+--------------+ SFJ      Full                                                        +---------+---------------+---------+-----------+----------+--------------+ FV Prox  Full                                                         +---------+---------------+---------+-----------+----------+--------------+ FV Mid   Full                                                        +---------+---------------+---------+-----------+----------+--------------+ FV DistalFull                                                        +---------+---------------+---------+-----------+----------+--------------+ PFV      Full                                                        +---------+---------------+---------+-----------+----------+--------------+ POP      Full           Yes      Yes                                 +---------+---------------+---------+-----------+----------+--------------+ PTV      Full                                                        +---------+---------------+---------+-----------+----------+--------------+ PERO     Full                                                        +---------+---------------+---------+-----------+----------+--------------+   +---------+---------------+---------+-----------+----------+--------------+  LEFT     CompressibilityPhasicitySpontaneityPropertiesThrombus Aging +---------+---------------+---------+-----------+----------+--------------+ CFV      Full           Yes      Yes                                 +---------+---------------+---------+-----------+----------+--------------+ SFJ      Full                                                        +---------+---------------+---------+-----------+----------+--------------+ FV Prox  Full                                                        +---------+---------------+---------+-----------+----------+--------------+ FV Mid   Full                                                        +---------+---------------+---------+-----------+----------+--------------+ FV DistalFull                                                         +---------+---------------+---------+-----------+----------+--------------+ PFV      Full                                                        +---------+---------------+---------+-----------+----------+--------------+ POP      Full           Yes      Yes                                 +---------+---------------+---------+-----------+----------+--------------+ PTV      Full                                                        +---------+---------------+---------+-----------+----------+--------------+ PERO     Full                                                        +---------+---------------+---------+-----------+----------+--------------+     Summary: BILATERAL: - No evidence of deep vein thrombosis seen in the lower extremities, bilaterally. - No evidence of superficial venous thrombosis in the lower extremities, bilaterally. -   *See table(s) above for measurements and observations. Electronically signed by Gerarda Fraction on  08/02/2022 at 2:54:52 PM.    Final    DG Chest 1 View  Result Date: 08/01/2022 CLINICAL DATA:  Shortness of breath. EXAM: CHEST  1 VIEW COMPARISON:  Chest radiographs 11/15/2021 and 08/19/2019 FINDINGS: Cardiac silhouette is again mildly enlarged. Mediastinal contours are within normal limits. The patient is mildly rightward rotated. Mild chronic interstitial thickening is mildly decreased from 11/15/2021 and more similar to 08/19/2019. Mild horizontal linear left basilar subsegmental atelectasis. No pleural effusion or pneumothorax. No acute skeletal abnormality. IMPRESSION: 1. Mild chronic interstitial thickening, mildly decreased from 11/15/2021 and more similar to 08/19/2019. This likely represents chronic interstitial scarring without interstitial pulmonary edema. 2. Mild cardiomegaly. Electronically Signed   By: Neita Garnet M.D.   On: 08/01/2022 13:41    Labs: BNP (last 3 results) Recent Labs    11/15/21 0529 08/12/22 1710  BNP 356.6*  1,084.9*   Basic Metabolic Panel: Recent Labs  Lab 08/16/22 0036 08/17/22 0044 08/18/22 0105 08/19/22 0144 08/20/22 0125  NA 136 136 135 133* 133*  K 4.4 3.9 4.1 4.0 3.7  CL 101 101 101 99 98  CO2 23 25 24 24 25   GLUCOSE 109* 105* 106* 111* 106*  BUN 41* 26* 37* 50* 35*  CREATININE 3.60* 2.57* 3.01* 3.51* 2.89*  CALCIUM 8.2* 8.0* 8.1* 8.0* 7.9*  PHOS 4.1 3.1 2.8 2.3* 1.7*   Liver Function Tests: Recent Labs  Lab 08/16/22 0036 08/17/22 0044 08/18/22 0105 08/19/22 0144 08/20/22 0125  ALBUMIN 2.1* 1.9* 1.9* 2.0* 2.0*   No results for input(s): "LIPASE", "AMYLASE" in the last 168 hours. No results for input(s): "AMMONIA" in the last 168 hours. CBC: Recent Labs  Lab 08/15/22 0104 08/16/22 0036 08/17/22 0044 08/18/22 0105 08/19/22 0845  WBC 6.9 9.2 9.0 9.8 12.0*  HGB 7.3* 8.3* 8.5* 8.4* 8.8*  HCT 23.4* 26.3* 26.2* 26.9* 29.0*  MCV 91.4 92.9 93.6 94.1 96.7  PLT 329 360 360 377 409*   Cardiac Enzymes: No results for input(s): "CKTOTAL", "CKMB", "CKMBINDEX", "TROPONINI" in the last 168 hours. BNP: Invalid input(s): "POCBNP" CBG: Recent Labs  Lab 08/14/22 1250  GLUCAP 92   D-Dimer No results for input(s): "DDIMER" in the last 72 hours. Hgb A1c No results for input(s): "HGBA1C" in the last 72 hours. Lipid Profile No results for input(s): "CHOL", "HDL", "LDLCALC", "TRIG", "CHOLHDL", "LDLDIRECT" in the last 72 hours. Thyroid function studies No results for input(s): "TSH", "T4TOTAL", "T3FREE", "THYROIDAB" in the last 72 hours.  Invalid input(s): "FREET3" Anemia work up No results for input(s): "VITAMINB12", "FOLATE", "FERRITIN", "TIBC", "IRON", "RETICCTPCT" in the last 72 hours. Urinalysis    Component Value Date/Time   COLORURINE YELLOW 08/15/2022 1503   APPEARANCEUR CLOUDY (A) 08/15/2022 1503   LABSPEC 1.006 08/15/2022 1503   PHURINE 6.0 08/15/2022 1503   GLUCOSEU NEGATIVE 08/15/2022 1503   HGBUR SMALL (A) 08/15/2022 1503   BILIRUBINUR NEGATIVE  08/15/2022 1503   KETONESUR NEGATIVE 08/15/2022 1503   PROTEINUR 100 (A) 08/15/2022 1503   UROBILINOGEN 0.2 03/02/2010 2027   NITRITE NEGATIVE 08/15/2022 1503   LEUKOCYTESUR LARGE (A) 08/15/2022 1503   Sepsis Labs Recent Labs  Lab 08/16/22 0036 08/17/22 0044 08/18/22 0105 08/19/22 0845  WBC 9.2 9.0 9.8 12.0*   Microbiology Recent Results (from the past 240 hour(s))  Culture, blood (Routine x 2)     Status: None   Collection Time: 08/12/22  5:08 PM   Specimen: BLOOD RIGHT ARM  Result Value Ref Range Status   Specimen Description BLOOD RIGHT ARM  Final  Special Requests   Final    BOTTLES DRAWN AEROBIC AND ANAEROBIC Blood Culture results may not be optimal due to an inadequate volume of blood received in culture bottles   Culture   Final    NO GROWTH 5 DAYS Performed at [facility], [address], [place], [place] [address]    Report Status 08/17/2022 FINAL  Final  Culture, blood (Routine x 2)     Status: None   Collection Time: 08/12/22  5:13 PM   Specimen: BLOOD  Result Value Ref Range Status   Specimen Description BLOOD LEFT ANTECUBITAL  Final   Special Requests   Final    BOTTLES DRAWN AEROBIC AND ANAEROBIC Blood Culture adequate volume   Culture   Final    NO GROWTH 5 DAYS Performed at [facility], [address], [place], [place] [address]    Report Status 08/17/2022 FINAL  Final  Urine Culture     Status: Abnormal   Collection Time: 08/15/22  3:03 PM   Specimen: Urine, Random  Result Value Ref Range Status   Specimen Description URINE, RANDOM  Final   Special Requests NONE Reflexed from [id number]  Final   Culture (A)  Final    >=100,000 COLONIES/mL KLEBSIELLA PNEUMONIAE Confirmed Extended Spectrum Beta-Lactamase Producer (ESBL).  In bloodstream infections from ESBL organisms, carbapenems are preferred over piperacillin/tazobactam. They are shown to have a lower risk of mortality. MULTI-DRUG RESISTANT ORGANISM CRITICAL RESULT CALLED TO, READ BACK  BY AND VERIFIED WITH: RN [place] Brooke Dare [phone] BY Berline Chough, [place] Performed at [facility], [address] [place], [place] [address]    Report Status 08/17/2022 FINAL  Final   Organism ID, Bacteria KLEBSIELLA PNEUMONIAE (A)  Final      Susceptibility   Klebsiella pneumoniae - MIC*    AMPICILLIN >=32 RESISTANT Resistant     CEFAZOLIN >=64 RESISTANT Resistant     CEFEPIME >=32 RESISTANT Resistant     CEFTRIAXONE >=64 RESISTANT Resistant     CIPROFLOXACIN <=0.25 SENSITIVE Sensitive     GENTAMICIN >=16 RESISTANT Resistant     IMIPENEM <=0.25 SENSITIVE Sensitive     NITROFURANTOIN 128 RESISTANT Resistant     TRIMETH/SULFA >=320 RESISTANT Resistant     AMPICILLIN/SULBACTAM >=32 RESISTANT Resistant     PIP/TAZO <=4 SENSITIVE Sensitive     * >=100,000 COLONIES/mL KLEBSIELLA PNEUMONIAE  Respiratory (~20 pathogens) panel by PCR     Status: None   Collection Time: 08/15/22  5:25 PM   Specimen: Nasopharyngeal Swab; Respiratory  Result Value Ref Range Status   Adenovirus NOT DETECTED NOT DETECTED Final   Coronavirus 229E NOT DETECTED NOT DETECTED Final    Comment: (NOTE) The Coronavirus on the Respiratory Panel, DOES NOT test for the novel  Coronavirus (2019 nCoV)    Coronavirus HKU1 NOT DETECTED NOT DETECTED Final   Coronavirus NL63 NOT DETECTED NOT DETECTED Final   Coronavirus OC43 NOT DETECTED NOT DETECTED Final   Metapneumovirus NOT DETECTED NOT DETECTED Final   Rhinovirus / Enterovirus NOT DETECTED NOT DETECTED Final   Influenza A NOT DETECTED NOT DETECTED Final   Influenza B NOT DETECTED NOT DETECTED Final   Parainfluenza Virus 1 NOT DETECTED NOT DETECTED Final   Parainfluenza Virus 2 NOT DETECTED NOT DETECTED Final   Parainfluenza Virus 3 NOT DETECTED NOT DETECTED Final   Parainfluenza Virus 4 NOT DETECTED NOT DETECTED Final   Respiratory Syncytial Virus NOT DETECTED NOT DETECTED Final   Bordetella pertussis NOT DETECTED  NOT DETECTED Final   Bordetella Parapertussis NOT  DETECTED NOT DETECTED Final   Chlamydophila pneumoniae NOT DETECTED NOT DETECTED Final   Mycoplasma pneumoniae NOT DETECTED NOT DETECTED Final    Comment: Performed at [facility], [address] [place], [place] [address]  SARS Coronavirus 2 by RT PCR (hospital order, performed in [facility] hospital lab) *cepheid single result test* Anterior Nasal Swab     Status: None   Collection Time: 08/15/22  5:25 PM   Specimen: Anterior Nasal Swab  Result Value Ref Range Status   SARS Coronavirus 2 by RT PCR NEGATIVE NEGATIVE Final    Comment: Performed at [facility], [address], [place], [place] [address]     Time coordinating discharge: 25 minutes  SIGNED: Lanae Boast, MD  [facility] 08/20/2022, 1:49 PM  If 7PM-7AM, please contact night-coverage [email or web address]

## 2022-08-20 NOTE — Plan of Care (Signed)
[facility]  Initial Hemodialysis Orders  Dialysis center: [facility]  Patient's name: [name] DOB: 1961-01-17 AKI or ESRD: ESRD  Discharge diagnosis: Acute on chronic diastolic HF  Chronic Hepatitis 3.   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 COPD  5. Polysubstance abuse  Allergies:  Allergies  Allergen Reactions   Zestril [Lisinopril] Other (See Comments)    Hyperkalemia    Date of First Dialysis: 08/14/2022 Cause of renal disease: HTN  Dialysis Prescription: Dialysis Frequency: T,Th,S Tx duration: 4 hours BFR: 400 DFR: 600  EDW: 59.5 kg  Dialyzer: 180NRe UF profile/Sodium modeling?: NA Dialysis Bath: 2.0 K 2.5 Ca  Dialysis access: Access type: RIJ TDC/R Brachiobasilic AVF Date placed: 08/14/2022 Surgeon: Dr. Lenell Antu Needle gauge: NA  In Center Medications: Heparin Dose: No heparin  Type: NA VDRA: Hectorol 2 mcg IV q HD Venofer: per protocol Mircera: per protocol Last ESA ARanesp 200 mcg 08/13/2022 Sensipar: NA  Discharge labs: Hgb: 8.8 K+: 3.7  Ca: 7.9  Phos: 1.7 Alb: 2.0  Please draw Monthly labs on admission. Additional labs needed: NA Additional notes/follow-up: Dietician to see pt-very low albumin   Alonna Buckler [facility] [phone]

## 2022-08-20 NOTE — Progress Notes (Signed)
[facility]  Name: [name] MRN: [id number] DOB: Jun 01, 1960  Summary: 62 yo female smoker with COPD, ESRD ,HFpEF, substance abuse, IDA from recurrent GI bleeding presented with dyspnea and edema.  Lives [place] motel.  Found to have pulmonary edema and pleural effusions.  PCCM consulted to assist with respiratory management.  Subjective: Breathing better.  Not needing supplemental oxygen.  Not having cough or wheeze.  Vital signs: BP (!) 124/105 (BP Location: Left Arm)   Pulse 66   Temp 98 F (36.7 C) (Oral)   Resp 11   Ht 5\' 7"  (1.702 m)   Wt 59.4 kg   SpO2 99%   BMI 20.51 kg/m   Physical exam:  General - alert Eyes - pupils reactive ENT - no sinus tenderness, no stridor Cardiac - regular rate/rhythm, no murmur Chest - equal breath sounds b/l, no wheezing or rales Abdomen - soft, non tender, + bowel sounds Extremities - no cyanosis, clubbing, or edema Skin - no rashes Neuro - normal strength, moves extremities, follows commands Psych - normal mood and behavior  Assessment/plan:  COPD. - resume trelegy 100 mcg one puff daily when she is ready for discharge - prn albuterol - wean off prednisone as tolerated  Bilateral pleural effusions. - likely transudates in setting of CHF and renal failure - defer thoracentesis at this time  ESRD on iHD. HFpEF. IDA with Recurrent GI bleeding. - per primary team  PCCM will sign off.  Please call if additional assistance needed while she is in hospital.  Coralyn Helling, MD [facility]monary/Critical Care Pager - [phone] or [phone] 08/20/2022, 11:28 AM

## 2022-08-20 NOTE — Progress Notes (Signed)
Mobility Specialist Progress Note:   08/20/22 1151  Mobility  Activity Ambulated with assistance in hallway  Level of Assistance Standby assist, set-up cues, supervision of patient - no hands on  Assistive Device None  Distance Ambulated (ft) 500 ft  Activity Response Tolerated well  Mobility Referral Yes  $Mobility charge 1 Mobility  Mobility Specialist Start Time (ACUTE ONLY) 1115  Mobility Specialist Stop Time (ACUTE ONLY) 1130  Mobility Specialist Time Calculation (min) (ACUTE ONLY) 15 min   Pt received in bed eager to ambulate. No c/o throughout. Pt assisted back in bed will call bell at hand.   Pre Mobility RA SPO2 96% During Mobility RA SPO2 90% Post Mobility RA SPO2 94%  Thompson Grayer Mobility Specialist  Please contact vis Secure Chat or  Rehab Office [phone]

## 2022-08-20 NOTE — Progress Notes (Signed)
AVS gone over with pt. All questions answered. Pt home meds from pharmacy and new meds from [facility] given to pt. Pt taken to exit via wheelchair and left with her friend.

## 2022-08-20 NOTE — Plan of Care (Signed)
  Problem: Education: Goal: Knowledge of disease and its progression will improve Outcome: Adequate for Discharge   Problem: Fluid Volume: Goal: Compliance with measures to maintain balanced fluid volume will improve Outcome: Adequate for Discharge   Problem: Health Behavior/Discharge Planning: Goal: Ability to manage health-related needs will improve Outcome: Adequate for Discharge   Problem: Clinical Measurements: Goal: Complications related to the disease process, condition or treatment will be avoided or minimized Outcome: Adequate for Discharge

## 2022-08-21 ENCOUNTER — Telehealth: Payer: Self-pay

## 2022-08-21 ENCOUNTER — Telehealth: Payer: Self-pay | Admitting: Nurse Practitioner

## 2022-08-21 NOTE — Transitions of Care (Post Inpatient/ED Visit) (Signed)
   08/21/2022  Name: [name] MRN: [id number] DOB: 1960/08/28  Today's TOC FU Call Status: Today's TOC FU Call Status:: Unsuccessul Call (1st Attempt) Unsuccessful Call (1st Attempt) Date: 08/21/22  Attempted to reach the patient regarding the most recent Inpatient/ED visit.  Follow Up Plan: Additional outreach attempts will be made to reach the patient to complete the Transitions of Care (Post Inpatient/ED visit) call.   Signature   Robyne Peers, RN

## 2022-08-21 NOTE — Telephone Encounter (Signed)
Copied from CRM [id number]. Topic: General - Other >> Aug 21, 2022  4:39 PM Santiya F wrote: Reason for CRM: Pt is calling in returning a call from [facility]. Please advise.

## 2022-08-21 NOTE — Telephone Encounter (Signed)
Transition of Care - Initial Contact from Inpatient Facility  Date of discharge: 08/20/2022 Date of contact: 08/21/2022  Method: Phone Spoke to: Patient  Patient contacted to discuss transition of care from recent inpatient hospitalization. Patient was admitted to [facility] from 05/27-06/06/2022 with discharge diagnosis of Acute hypoxic respiratory failure. Progression to ESRD  The discharge medication list was reviewed. Patient understands the changes and has no concerns.   Patient will return to his/her outpatient HD unit on: 08/22/2022  No other concerns at this time.

## 2022-08-22 ENCOUNTER — Telehealth: Payer: Self-pay

## 2022-08-22 NOTE — Transitions of Care (Post Inpatient/ED Visit) (Signed)
   08/22/2022  Name: [name] MRN: [id number] DOB: [birth year]  Today's TOC FU Call Status: Today's TOC FU Call Status:: Unsuccessful Call (2nd Attempt) Unsuccessful Call (1st Attempt) Date: 08/21/22 Unsuccessful Call (2nd Attempt) Date: 08/22/22 I called [phone] and the recording stated that the call cannot be completed at this time   Attempted to reach the patient regarding the most recent Inpatient/ED visit.  Follow Up Plan: Additional outreach attempts will be made to reach the patient to complete the Transitions of Care (Post Inpatient/ED visit) call.   Signature Robyne Peers, RN

## 2022-08-22 NOTE — Telephone Encounter (Signed)
I have attempted to reach the patient this morning  The information is documented in a TOC call.

## 2022-08-26 ENCOUNTER — Telehealth: Payer: Self-pay

## 2022-08-26 NOTE — Transitions of Care (Post Inpatient/ED Visit) (Signed)
   08/26/2022  Name: [name] MRN: [id number] DOB: 1960/10/04  Today's TOC FU Call Status: Unsuccessful Call (1st Attempt) Date: 08/21/22 Unsuccessful Call (2nd Attempt) Date: 08/22/22 Unsuccessful Call (3rd Attempt) Date: 08/26/22  Attempted to reach the patient regarding the most recent Inpatient/ED visit.  Follow Up Plan: No further outreach attempts will be made at this time. We have been unable to contact the patient.  The patient has an appointment with Georgian Co, PA at [facility] on 08/28/2022.   Signature Robyne Peers, RN

## 2022-08-28 ENCOUNTER — Ambulatory Visit: Payer: Self-pay | Admitting: Physician Assistant

## 2022-09-02 ENCOUNTER — Other Ambulatory Visit: Payer: Self-pay

## 2022-09-12 ENCOUNTER — Ambulatory Visit: Payer: Self-pay | Admitting: Internal Medicine

## 2022-09-13 ENCOUNTER — Other Ambulatory Visit: Payer: Self-pay | Admitting: *Deleted

## 2022-09-13 DIAGNOSIS — N186 End stage renal disease: Secondary | ICD-10-CM

## 2022-10-01 ENCOUNTER — Ambulatory Visit (INDEPENDENT_AMBULATORY_CARE_PROVIDER_SITE_OTHER): Payer: Self-pay | Admitting: Physician Assistant

## 2022-10-01 ENCOUNTER — Ambulatory Visit (HOSPITAL_COMMUNITY)
Admission: RE | Admit: 2022-10-01 | Discharge: 2022-10-01 | Disposition: A | Discharge status: Home / Self Care | Payer: Self-pay | Source: Ambulatory Visit | Attending: Vascular Surgery | Admitting: Vascular Surgery

## 2022-10-01 VITALS — BP 167/79 | HR 89 | Temp 97.9°F | Resp 18 | Ht 67.0 in | Wt 98.0 lb

## 2022-10-01 DIAGNOSIS — N186 End stage renal disease: Secondary | ICD-10-CM | POA: Insufficient documentation

## 2022-10-01 NOTE — Progress Notes (Signed)
Postoperative Access Visit   History of Present Illness   [name] is a 62 y.o. year old female who presents for postoperative follow-up for:   right internal jugular TDC and right brachiobasilic AV fistula creation by Dr. Lenell Antu on 08/14/22. The patient's wounds are healed.  The patient notes no steal symptoms.  The patient is able to complete their activities of daily living.  She says she has noticed some fullness around the incision but she says it comes and goes.   She is currently dialyzing via a right internal jugular TDC on TTS at the [facility]  Physical Examination   Vitals:   10/01/22 0927  BP: (!) 167/79  Pulse: 89  Resp: 18  Temp: 97.9 F (36.6 C)  TempSrc: Temporal  SpO2: 99%  Weight: 98 lb (44.5 kg)  Height: 5\' 7"  (1.702 m)   Body mass index is 15.35 kg/m.  right arm Incision is healed, 2+ radial pulse, hand grip is 5/5, sensation in digits is intact, palpable thrill, bruit can  be auscultated. Small fluid filled collection around [facility] incision   Non invasive vascular lab: Findings:  +--------------------+----------+-----------------+--------+  AVF                PSV (cm/s)Flow Vol (mL/min)Comments  +--------------------+----------+-----------------+--------+  Native artery inflow   360           732                 +--------------------+----------+-----------------+--------+  AVF Anastomosis        790                               +--------------------+----------+-----------------+--------+   +------------+----------+-------------+----------+-------------------+  OUTFLOW VEINPSV (cm/s)Diameter (cm)Depth (cm)     Describe        +------------+----------+-------------+----------+-------------------+  Prox UA        187        0.56        0.36   partially-occlusive  +------------+----------+-------------+----------+-------------------+  Mid UA         188        0.51        0.51                         +------------+----------+-------------+----------+-------------------+  Dist UA        260        0.47        0.67                        +------------+----------+-------------+----------+-------------------+  AC Fossa       722        0.44        1.19        stenotic        +------------+----------+-------------+----------+-------------------+  2.4 x 1.7 x 4.1cm heterogenoeus structure in the Right AC fossa. Sonographic findings suggestive of hematoma. Structure is adjacent to AVF anastomosis site and appears to compress the anastomosis and outflow vein.     Summary:  Arteriovenous fistula-Elevated velocities noted. Arteriovenous fistula-Thrombus noted.  Arteriovenous fistula-Stenosis noted.   Medical Decision Making   AREEJ TAYLER is a 62 y.o. year old female who presents s/p right internal jugular TDC and right brachiobasilic AV fistula creation by Dr. Lenell Antu on 08/14/22. The patient's wounds are healed.  The patient notes no steal symptoms. She does  have small fluid collection at the [facility] incision. It is soft, no signs of infection. Fistula clinically is functioning well with good thrill. Duplex today shows some elevated velocities as well as stenosis noted secondary to compression from the fluid collection. I have encouraged pt to exercise her arm as fistula has not matured as much as we would like. I think if the fluid collection is truly compressive that at time of elevation that this will be resolved and will also help with the fistula maturation. I will arrange a right Basilic vein transposition with Dr. Lenell Antu in the near future. She dialyzes on TTS so will try to arrange this on a non dialysis day.   Graceann Congress, PA-C [facility] Office: [phone]  Clinic MD: Steve Rattler

## 2022-10-07 ENCOUNTER — Telehealth: Payer: Self-pay

## 2022-10-07 NOTE — Telephone Encounter (Signed)
Attempted to reach patient to schedule surgery. No answer and unable to leave VM.

## 2022-10-15 NOTE — Telephone Encounter (Signed)
2nd attempt to reach pt to sch surgery, called both cell and home #'s, no answer, unable to leave vm.  Called # listed on DPR from 2017 ([phone]), no longer one of pt's #'s.

## 2022-10-18 ENCOUNTER — Encounter: Payer: Self-pay | Admitting: *Deleted

## 2022-10-25 ENCOUNTER — Emergency Department (HOSPITAL_COMMUNITY): Payer: Medicare Other

## 2022-10-25 ENCOUNTER — Other Ambulatory Visit: Payer: Self-pay

## 2022-10-25 ENCOUNTER — Inpatient Hospital Stay (HOSPITAL_COMMUNITY)
Admission: EM | Admit: 2022-10-25 | Discharge: 2022-11-02 | DRG: 871 | Disposition: A | Discharge status: Home / Self Care | Payer: Medicare Other | Attending: Student | Admitting: Student

## 2022-10-25 ENCOUNTER — Encounter (HOSPITAL_COMMUNITY): Payer: Self-pay

## 2022-10-25 DIAGNOSIS — I491 Atrial premature depolarization: Secondary | ICD-10-CM

## 2022-10-25 DIAGNOSIS — N19 Unspecified kidney failure: Secondary | ICD-10-CM | POA: Diagnosis not present

## 2022-10-25 DIAGNOSIS — N186 End stage renal disease: Secondary | ICD-10-CM | POA: Diagnosis present

## 2022-10-25 DIAGNOSIS — Z1624 Resistance to multiple antibiotics: Secondary | ICD-10-CM | POA: Diagnosis present

## 2022-10-25 DIAGNOSIS — G8929 Other chronic pain: Secondary | ICD-10-CM

## 2022-10-25 DIAGNOSIS — I5033 Acute on chronic diastolic (congestive) heart failure: Secondary | ICD-10-CM | POA: Diagnosis not present

## 2022-10-25 DIAGNOSIS — N2581 Secondary hyperparathyroidism of renal origin: Secondary | ICD-10-CM | POA: Diagnosis present

## 2022-10-25 DIAGNOSIS — R6520 Severe sepsis without septic shock: Secondary | ICD-10-CM | POA: Diagnosis present

## 2022-10-25 DIAGNOSIS — F1910 Other psychoactive substance abuse, uncomplicated: Secondary | ICD-10-CM

## 2022-10-25 DIAGNOSIS — R627 Adult failure to thrive: Secondary | ICD-10-CM | POA: Diagnosis present

## 2022-10-25 DIAGNOSIS — Z8774 Personal history of (corrected) congenital malformations of heart and circulatory system: Secondary | ICD-10-CM

## 2022-10-25 DIAGNOSIS — R0602 Shortness of breath: Secondary | ICD-10-CM | POA: Diagnosis not present

## 2022-10-25 DIAGNOSIS — N390 Urinary tract infection, site not specified: Secondary | ICD-10-CM | POA: Diagnosis present

## 2022-10-25 DIAGNOSIS — G894 Chronic pain syndrome: Secondary | ICD-10-CM | POA: Diagnosis not present

## 2022-10-25 DIAGNOSIS — R6510 Systemic inflammatory response syndrome (SIRS) of non-infectious origin without acute organ dysfunction: Secondary | ICD-10-CM | POA: Diagnosis not present

## 2022-10-25 DIAGNOSIS — I739 Peripheral vascular disease, unspecified: Secondary | ICD-10-CM | POA: Diagnosis not present

## 2022-10-25 DIAGNOSIS — Z91148 Patient's other noncompliance with medication regimen for other reason: Secondary | ICD-10-CM

## 2022-10-25 DIAGNOSIS — I2510 Atherosclerotic heart disease of native coronary artery without angina pectoris: Secondary | ICD-10-CM | POA: Diagnosis present

## 2022-10-25 DIAGNOSIS — Z992 Dependence on renal dialysis: Secondary | ICD-10-CM | POA: Diagnosis not present

## 2022-10-25 DIAGNOSIS — K5520 Angiodysplasia of colon without hemorrhage: Secondary | ICD-10-CM | POA: Diagnosis not present

## 2022-10-25 DIAGNOSIS — B961 Klebsiella pneumoniae [K. pneumoniae] as the cause of diseases classified elsewhere: Secondary | ICD-10-CM | POA: Diagnosis present

## 2022-10-25 DIAGNOSIS — Z91158 Patient's noncompliance with renal dialysis for other reason: Secondary | ICD-10-CM

## 2022-10-25 DIAGNOSIS — J449 Chronic obstructive pulmonary disease, unspecified: Secondary | ICD-10-CM

## 2022-10-25 DIAGNOSIS — E43 Unspecified severe protein-calorie malnutrition: Secondary | ICD-10-CM | POA: Diagnosis present

## 2022-10-25 DIAGNOSIS — R195 Other fecal abnormalities: Secondary | ICD-10-CM | POA: Diagnosis not present

## 2022-10-25 DIAGNOSIS — K219 Gastro-esophageal reflux disease without esophagitis: Secondary | ICD-10-CM

## 2022-10-25 DIAGNOSIS — Z716 Tobacco abuse counseling: Secondary | ICD-10-CM

## 2022-10-25 DIAGNOSIS — Z22322 Carrier or suspected carrier of Methicillin resistant Staphylococcus aureus: Secondary | ICD-10-CM

## 2022-10-25 DIAGNOSIS — F419 Anxiety disorder, unspecified: Secondary | ICD-10-CM

## 2022-10-25 DIAGNOSIS — D649 Anemia, unspecified: Secondary | ICD-10-CM | POA: Diagnosis not present

## 2022-10-25 DIAGNOSIS — B1920 Unspecified viral hepatitis C without hepatic coma: Secondary | ICD-10-CM

## 2022-10-25 DIAGNOSIS — J441 Chronic obstructive pulmonary disease with (acute) exacerbation: Secondary | ICD-10-CM

## 2022-10-25 DIAGNOSIS — F32A Depression, unspecified: Secondary | ICD-10-CM | POA: Diagnosis present

## 2022-10-25 DIAGNOSIS — I132 Hypertensive heart and chronic kidney disease with heart failure and with stage 5 chronic kidney disease, or end stage renal disease: Secondary | ICD-10-CM | POA: Diagnosis present

## 2022-10-25 DIAGNOSIS — I10 Essential (primary) hypertension: Secondary | ICD-10-CM | POA: Diagnosis not present

## 2022-10-25 DIAGNOSIS — I700 Atherosclerosis of aorta: Secondary | ICD-10-CM | POA: Diagnosis present

## 2022-10-25 DIAGNOSIS — E875 Hyperkalemia: Secondary | ICD-10-CM | POA: Diagnosis not present

## 2022-10-25 DIAGNOSIS — M898X9 Other specified disorders of bone, unspecified site: Secondary | ICD-10-CM | POA: Diagnosis present

## 2022-10-25 DIAGNOSIS — K5521 Angiodysplasia of colon with hemorrhage: Secondary | ICD-10-CM | POA: Diagnosis not present

## 2022-10-25 DIAGNOSIS — J9601 Acute respiratory failure with hypoxia: Secondary | ICD-10-CM | POA: Diagnosis present

## 2022-10-25 DIAGNOSIS — E871 Hypo-osmolality and hyponatremia: Secondary | ICD-10-CM

## 2022-10-25 DIAGNOSIS — J302 Other seasonal allergic rhinitis: Secondary | ICD-10-CM | POA: Diagnosis present

## 2022-10-25 DIAGNOSIS — J189 Pneumonia, unspecified organism: Secondary | ICD-10-CM | POA: Diagnosis present

## 2022-10-25 DIAGNOSIS — E8729 Other acidosis: Secondary | ICD-10-CM | POA: Diagnosis present

## 2022-10-25 DIAGNOSIS — D62 Acute posthemorrhagic anemia: Secondary | ICD-10-CM | POA: Diagnosis not present

## 2022-10-25 DIAGNOSIS — J440 Chronic obstructive pulmonary disease with acute lower respiratory infection: Secondary | ICD-10-CM | POA: Diagnosis present

## 2022-10-25 DIAGNOSIS — J15212 Pneumonia due to Methicillin resistant Staphylococcus aureus: Secondary | ICD-10-CM | POA: Diagnosis not present

## 2022-10-25 DIAGNOSIS — D638 Anemia in other chronic diseases classified elsewhere: Secondary | ICD-10-CM

## 2022-10-25 DIAGNOSIS — E876 Hypokalemia: Secondary | ICD-10-CM | POA: Diagnosis present

## 2022-10-25 DIAGNOSIS — Z79899 Other long term (current) drug therapy: Secondary | ICD-10-CM

## 2022-10-25 DIAGNOSIS — K2970 Gastritis, unspecified, without bleeding: Secondary | ICD-10-CM | POA: Diagnosis not present

## 2022-10-25 DIAGNOSIS — Z681 Body mass index (BMI) 19 or less, adult: Secondary | ICD-10-CM | POA: Diagnosis not present

## 2022-10-25 DIAGNOSIS — A419 Sepsis, unspecified organism: Principal | ICD-10-CM | POA: Diagnosis present

## 2022-10-25 DIAGNOSIS — Z888 Allergy status to other drugs, medicaments and biological substances status: Secondary | ICD-10-CM

## 2022-10-25 DIAGNOSIS — E872 Acidosis, unspecified: Secondary | ICD-10-CM | POA: Diagnosis present

## 2022-10-25 DIAGNOSIS — I5032 Chronic diastolic (congestive) heart failure: Secondary | ICD-10-CM

## 2022-10-25 DIAGNOSIS — E1122 Type 2 diabetes mellitus with diabetic chronic kidney disease: Secondary | ICD-10-CM | POA: Diagnosis present

## 2022-10-25 DIAGNOSIS — E11649 Type 2 diabetes mellitus with hypoglycemia without coma: Secondary | ICD-10-CM | POA: Diagnosis not present

## 2022-10-25 DIAGNOSIS — E1151 Type 2 diabetes mellitus with diabetic peripheral angiopathy without gangrene: Secondary | ICD-10-CM | POA: Diagnosis present

## 2022-10-25 DIAGNOSIS — F418 Other specified anxiety disorders: Secondary | ICD-10-CM | POA: Diagnosis not present

## 2022-10-25 DIAGNOSIS — Z5900 Homelessness unspecified: Secondary | ICD-10-CM | POA: Diagnosis not present

## 2022-10-25 DIAGNOSIS — Z7951 Long term (current) use of inhaled steroids: Secondary | ICD-10-CM

## 2022-10-25 DIAGNOSIS — D631 Anemia in chronic kidney disease: Secondary | ICD-10-CM | POA: Diagnosis present

## 2022-10-25 DIAGNOSIS — K2950 Unspecified chronic gastritis without bleeding: Secondary | ICD-10-CM | POA: Diagnosis present

## 2022-10-25 DIAGNOSIS — K31819 Angiodysplasia of stomach and duodenum without bleeding: Secondary | ICD-10-CM | POA: Diagnosis not present

## 2022-10-25 DIAGNOSIS — K2289 Other specified disease of esophagus: Secondary | ICD-10-CM

## 2022-10-25 DIAGNOSIS — F17210 Nicotine dependence, cigarettes, uncomplicated: Secondary | ICD-10-CM | POA: Diagnosis present

## 2022-10-25 DIAGNOSIS — F1994 Other psychoactive substance use, unspecified with psychoactive substance-induced mood disorder: Secondary | ICD-10-CM

## 2022-10-25 DIAGNOSIS — Z5982 Transportation insecurity: Secondary | ICD-10-CM

## 2022-10-25 DIAGNOSIS — Z1612 Extended spectrum beta lactamase (ESBL) resistance: Secondary | ICD-10-CM | POA: Diagnosis present

## 2022-10-25 DIAGNOSIS — Z833 Family history of diabetes mellitus: Secondary | ICD-10-CM

## 2022-10-25 DIAGNOSIS — E785 Hyperlipidemia, unspecified: Secondary | ICD-10-CM | POA: Diagnosis present

## 2022-10-25 HISTORY — DX: Pneumonia, unspecified organism: J18.9

## 2022-10-25 HISTORY — DX: Systemic inflammatory response syndrome (sirs) of non-infectious origin without acute organ dysfunction: R65.10

## 2022-10-25 HISTORY — DX: Chronic obstructive pulmonary disease with (acute) exacerbation: J44.1

## 2022-10-25 HISTORY — DX: Atrial premature depolarization: I49.1

## 2022-10-25 LAB — CBC WITH DIFFERENTIAL/PLATELET
Abs Immature Granulocytes: 0.04 10*3/uL (ref 0.00–0.07)
Abs Immature Granulocytes: 0 10*3/uL (ref 0.00–0.07)
Basophils Absolute: 0 10*3/uL (ref 0.0–0.1)
Basophils Absolute: 0 10*3/uL (ref 0.0–0.1)
Basophils Relative: 0 %
Basophils Relative: 0 %
Eosinophils Absolute: 0 10*3/uL (ref 0.0–0.5)
Eosinophils Absolute: 0 10*3/uL (ref 0.0–0.5)
Eosinophils Relative: 0 %
Eosinophils Relative: 0 %
HCT: 19.3 % — ABNORMAL LOW (ref 36.0–46.0)
HCT: 24.3 % — ABNORMAL LOW (ref 36.0–46.0)
Hemoglobin: 6.5 g/dL — CL (ref 12.0–15.0)
Hemoglobin: 8.1 g/dL — ABNORMAL LOW (ref 12.0–15.0)
Immature Granulocytes: 0 %
Lymphocytes Relative: 5 %
Lymphocytes Relative: 7 %
Lymphs Abs: 0.7 10*3/uL (ref 0.7–4.0)
Lymphs Abs: 0.6 10*3/uL — ABNORMAL LOW (ref 0.7–4.0)
MCH: 33.7 pg (ref 26.0–34.0)
MCH: 31.6 pg (ref 26.0–34.0)
MCHC: 33.7 g/dL (ref 30.0–36.0)
MCHC: 33.3 g/dL (ref 30.0–36.0)
MCV: 100 fL (ref 80.0–100.0)
MCV: 94.9 fL (ref 80.0–100.0)
Monocytes Absolute: 0.4 10*3/uL (ref 0.1–1.0)
Monocytes Absolute: 0.3 10*3/uL (ref 0.1–1.0)
Monocytes Relative: 3 %
Monocytes Relative: 3 %
Neutro Abs: 11.4 10*3/uL — ABNORMAL HIGH (ref 1.7–7.7)
Neutro Abs: 8.2 10*3/uL — ABNORMAL HIGH (ref 1.7–7.7)
Neutrophils Relative %: 92 %
Neutrophils Relative %: 90 %
Platelets: 278 10*3/uL (ref 150–400)
Platelets: 248 10*3/uL (ref 150–400)
RBC: 1.93 MIL/uL — ABNORMAL LOW (ref 3.87–5.11)
RBC: 2.56 MIL/uL — ABNORMAL LOW (ref 3.87–5.11)
RDW: 16.3 % — ABNORMAL HIGH (ref 11.5–15.5)
RDW: 16.2 % — ABNORMAL HIGH (ref 11.5–15.5)
WBC: 12.5 10*3/uL — ABNORMAL HIGH (ref 4.0–10.5)
WBC: 9.1 10*3/uL (ref 4.0–10.5)
nRBC: 0 % (ref 0.0–0.2)
nRBC: 0 % (ref 0.0–0.2)
nRBC: 0 /100 WBC

## 2022-10-25 LAB — URINALYSIS, W/ REFLEX TO CULTURE (INFECTION SUSPECTED)
Bilirubin Urine: NEGATIVE
Glucose, UA: 50 mg/dL — AB
Ketones, ur: NEGATIVE mg/dL
Nitrite: POSITIVE — AB
Protein, ur: 100 mg/dL — AB
Specific Gravity, Urine: 1.009 (ref 1.005–1.030)
pH: 5 (ref 5.0–8.0)

## 2022-10-25 LAB — RETICULOCYTES
Immature Retic Fract: 13.9 % (ref 2.3–15.9)
RBC.: 2.51 MIL/uL — ABNORMAL LOW (ref 3.87–5.11)
Retic Count, Absolute: 56 10*3/uL (ref 19.0–186.0)
Retic Ct Pct: 2.2 % (ref 0.4–3.1)

## 2022-10-25 LAB — TYPE AND SCREEN
ABO/RH(D): A POS
Antibody Screen: NEGATIVE
Unit division: 0
Unit division: 0
Unit division: 0

## 2022-10-25 LAB — IRON AND TIBC
Iron: 7 ug/dL — ABNORMAL LOW (ref 28–170)
Saturation Ratios: 4 % — ABNORMAL LOW (ref 10.4–31.8)
TIBC: 200 ug/dL — ABNORMAL LOW (ref 250–450)
UIBC: 193 ug/dL

## 2022-10-25 LAB — RENAL FUNCTION PANEL
Albumin: 2.4 g/dL — ABNORMAL LOW (ref 3.5–5.0)
Anion gap: 17 — ABNORMAL HIGH (ref 5–15)
BUN: 74 mg/dL — ABNORMAL HIGH (ref 8–23)
CO2: 11 mmol/L — ABNORMAL LOW (ref 22–32)
Calcium: 7.8 mg/dL — ABNORMAL LOW (ref 8.9–10.3)
Chloride: 102 mmol/L (ref 98–111)
Creatinine, Ser: 4.69 mg/dL — ABNORMAL HIGH (ref 0.44–1.00)
GFR, Estimated: 10 mL/min — ABNORMAL LOW (ref >60)
Glucose, Bld: 99 mg/dL (ref 70–99)
Phosphorus: 5.8 mg/dL — ABNORMAL HIGH (ref 2.5–4.6)
Potassium: 3.4 mmol/L — ABNORMAL LOW (ref 3.5–5.1)
Sodium: 130 mmol/L — ABNORMAL LOW (ref 135–145)

## 2022-10-25 LAB — BPAM RBC
Blood Product Expiration Date: 202408282359
Blood Product Expiration Date: 202408312359
Blood Product Expiration Date: 202408312359
ISSUE DATE / TIME: 202408091642
Unit Type and Rh: 6200
Unit Type and Rh: 6200
Unit Type and Rh: 6200

## 2022-10-25 LAB — RESPIRATORY PANEL BY PCR

## 2022-10-25 LAB — I-STAT CHEM 8, ED
BUN: 79 mg/dL — ABNORMAL HIGH (ref 8–23)
Calcium, Ion: 1.05 mmol/L — ABNORMAL LOW (ref 1.15–1.40)
Chloride: 105 mmol/L (ref 98–111)
Creatinine, Ser: 5.3 mg/dL — ABNORMAL HIGH (ref 0.44–1.00)
Glucose, Bld: 106 mg/dL — ABNORMAL HIGH (ref 70–99)
HCT: 19 % — ABNORMAL LOW (ref 36.0–46.0)
Hemoglobin: 6.5 g/dL — CL (ref 12.0–15.0)
Potassium: 3.5 mmol/L (ref 3.5–5.1)
Sodium: 128 mmol/L — ABNORMAL LOW (ref 135–145)
TCO2: 10 mmol/L — ABNORMAL LOW (ref 22–32)

## 2022-10-25 LAB — LIPID PANEL
Cholesterol: 135 mg/dL (ref 0–200)
HDL: 43 mg/dL (ref >40)
LDL Cholesterol: 70 mg/dL (ref 0–99)
Total CHOL/HDL Ratio: 3.1 RATIO
Triglycerides: 111 mg/dL (ref <150)
VLDL: 22 mg/dL (ref 0–40)

## 2022-10-25 LAB — COMPREHENSIVE METABOLIC PANEL
ALT: 15 U/L (ref 0–44)
AST: 23 U/L (ref 15–41)
Albumin: 2.5 g/dL — ABNORMAL LOW (ref 3.5–5.0)
Alkaline Phosphatase: 76 U/L (ref 38–126)
Anion gap: 17 — ABNORMAL HIGH (ref 5–15)
BUN: 75 mg/dL — ABNORMAL HIGH (ref 8–23)
CO2: 9 mmol/L — ABNORMAL LOW (ref 22–32)
Calcium: 8 mg/dL — ABNORMAL LOW (ref 8.9–10.3)
Chloride: 102 mmol/L (ref 98–111)
Creatinine, Ser: 4.8 mg/dL — ABNORMAL HIGH (ref 0.44–1.00)
GFR, Estimated: 10 mL/min — ABNORMAL LOW (ref >60)
Glucose, Bld: 107 mg/dL — ABNORMAL HIGH (ref 70–99)
Potassium: 3.5 mmol/L (ref 3.5–5.1)
Sodium: 128 mmol/L — ABNORMAL LOW (ref 135–145)
Total Bilirubin: 0.6 mg/dL (ref 0.3–1.2)
Total Protein: 6.3 g/dL — ABNORMAL LOW (ref 6.5–8.1)

## 2022-10-25 LAB — I-STAT VENOUS BLOOD GAS, ED
Acid-base deficit: 17 mmol/L — ABNORMAL HIGH (ref 0.0–2.0)
Bicarbonate: 8.2 mmol/L — ABNORMAL LOW (ref 20.0–28.0)
Calcium, Ion: 1.05 mmol/L — ABNORMAL LOW (ref 1.15–1.40)
HCT: 19 % — ABNORMAL LOW (ref 36.0–46.0)
Hemoglobin: 6.5 g/dL — CL (ref 12.0–15.0)
O2 Saturation: 65 %
Potassium: 3.5 mmol/L (ref 3.5–5.1)
Sodium: 129 mmol/L — ABNORMAL LOW (ref 135–145)
TCO2: 9 mmol/L — ABNORMAL LOW (ref 22–32)
pCO2, Ven: 18.5 mmHg — CL (ref 44–60)
pH, Ven: 7.253 (ref 7.25–7.43)
pO2, Ven: 38 mmHg (ref 32–45)

## 2022-10-25 LAB — URINE CULTURE

## 2022-10-25 LAB — FERRITIN: Ferritin: 187 ng/mL (ref 11–307)

## 2022-10-25 LAB — I-STAT CG4 LACTIC ACID, ED
Lactic Acid, Venous: 1.6 mmol/L (ref 0.5–1.9)
Lactic Acid, Venous: 1 mmol/L (ref 0.5–1.9)

## 2022-10-25 LAB — CULTURE, BLOOD (ROUTINE X 2)

## 2022-10-25 LAB — HEPATITIS B SURFACE ANTIGEN
Hepatitis B Surface Ag: NONREACTIVE
Hepatitis B Surface Ag: NONREACTIVE

## 2022-10-25 LAB — HEMOGLOBIN A1C
Hgb A1c MFr Bld: 4.6 % — ABNORMAL LOW (ref 4.8–5.6)
Mean Plasma Glucose: 85.32 mg/dL

## 2022-10-25 LAB — PREPARE RBC (CROSSMATCH)

## 2022-10-25 LAB — LIPASE, BLOOD: Lipase: 41 U/L (ref 11–51)

## 2022-10-25 LAB — I-STAT ARTERIAL BLOOD GAS, ED
Acid-base deficit: 17 mmol/L — ABNORMAL HIGH (ref 0.0–2.0)
Bicarbonate: 7.6 mmol/L — ABNORMAL LOW (ref 20.0–28.0)
Calcium, Ion: 1.08 mmol/L — ABNORMAL LOW (ref 1.15–1.40)
HCT: 21 % — ABNORMAL LOW (ref 36.0–46.0)
Hemoglobin: 7.1 g/dL — ABNORMAL LOW (ref 12.0–15.0)
O2 Saturation: 100 %
Patient temperature: 99.1
Potassium: 3.5 mmol/L (ref 3.5–5.1)
Sodium: 129 mmol/L — ABNORMAL LOW (ref 135–145)
TCO2: 8 mmol/L — ABNORMAL LOW (ref 22–32)
pCO2 arterial: 15.2 mmHg — CL (ref 32–48)
pH, Arterial: 7.31 — ABNORMAL LOW (ref 7.35–7.45)
pO2, Arterial: 193 mmHg — ABNORMAL HIGH (ref 83–108)

## 2022-10-25 LAB — FOLATE: Folate: 8.1 ng/mL (ref >5.9)

## 2022-10-25 LAB — POC OCCULT BLOOD, ED: Fecal Occult Bld: POSITIVE — AB

## 2022-10-25 LAB — VITAMIN B12: Vitamin B-12: 420 pg/mL (ref 180–914)

## 2022-10-25 LAB — MAGNESIUM: Magnesium: 1.6 mg/dL — ABNORMAL LOW (ref 1.7–2.4)

## 2022-10-25 MED ORDER — SODIUM CHLORIDE 0.9% FLUSH
3.0000 mL | Freq: Two times a day (BID) | INTRAVENOUS | Status: DC
  Administered 2022-10-26 – 2022-11-02 (×14): 3 mL via INTRAVENOUS

## 2022-10-25 MED ORDER — HEPARIN SODIUM (PORCINE) 1000 UNIT/ML DIALYSIS
2500.0000 [IU] | INTRAMUSCULAR | Status: DC | PRN
  Filled 2022-10-25: qty 3

## 2022-10-25 MED ORDER — HYDROMORPHONE HCL 1 MG/ML IJ SOLN
0.5000 mg | Freq: Once | INTRAMUSCULAR | Status: AC
  Administered 2022-10-25: 0.5 mg via INTRAVENOUS
  Filled 2022-10-25: qty 1

## 2022-10-25 MED ORDER — CHLORHEXIDINE GLUCONATE CLOTH 2 % EX PADS
6.0000 | MEDICATED_PAD | Freq: Every day | CUTANEOUS | Status: DC
  Administered 2022-10-27 – 2022-10-28 (×2): 6 via TOPICAL

## 2022-10-25 MED ORDER — VANCOMYCIN HCL IN DEXTROSE 1-5 GM/200ML-% IV SOLN
1000.0000 mg | Freq: Once | INTRAVENOUS | Status: AC
  Administered 2022-10-25: 1000 mg via INTRAVENOUS
  Filled 2022-10-25: qty 200

## 2022-10-25 MED ORDER — ONDANSETRON HCL 4 MG PO TABS: 4.0000 mg | ORAL_TABLET | Freq: Four times a day (QID) | ORAL | Status: DC | PRN

## 2022-10-25 MED ORDER — MAGNESIUM SULFATE 2 GM/50ML IV SOLN
2.0000 g | Freq: Once | INTRAVENOUS | Status: AC
  Administered 2022-10-26: 2 g via INTRAVENOUS
  Filled 2022-10-25: qty 50

## 2022-10-25 MED ORDER — FLUTICASONE FUROATE-VILANTEROL 100-25 MCG/ACT IN AEPB
1.0000 | INHALATION_SPRAY | Freq: Every day | RESPIRATORY_TRACT | Status: DC
Start: 2022-10-25 — End: 2022-10-25

## 2022-10-25 MED ORDER — FERROUS SULFATE 325 (65 FE) MG PO TABS: 325.0000 mg | ORAL_TABLET | Freq: Every day | ORAL | Status: DC

## 2022-10-25 MED ORDER — ACETAMINOPHEN 325 MG PO TABS
650.0000 mg | ORAL_TABLET | Freq: Four times a day (QID) | ORAL | Status: DC | PRN
  Administered 2022-10-30 – 2022-10-31 (×3): 650 mg via ORAL
  Filled 2022-10-25 (×3): qty 2

## 2022-10-25 MED ORDER — ACETAMINOPHEN 650 MG RE SUPP: 650.0000 mg | Freq: Four times a day (QID) | RECTAL | Status: DC | PRN

## 2022-10-25 MED ORDER — BENZONATATE 100 MG PO CAPS: 100.0000 mg | ORAL_CAPSULE | Freq: Three times a day (TID) | ORAL | Status: DC | PRN

## 2022-10-25 MED ORDER — GABAPENTIN 100 MG PO CAPS
100.0000 mg | ORAL_CAPSULE | Freq: Three times a day (TID) | ORAL | Status: DC
  Administered 2022-10-26 – 2022-11-02 (×23): 100 mg via ORAL
  Filled 2022-10-25 (×23): qty 1

## 2022-10-25 MED ORDER — GUAIFENESIN ER 600 MG PO TB12
600.0000 mg | ORAL_TABLET | Freq: Two times a day (BID) | ORAL | Status: DC
  Administered 2022-10-26 – 2022-11-02 (×15): 600 mg via ORAL
  Filled 2022-10-25 (×15): qty 1

## 2022-10-25 MED ORDER — PENTAFLUOROPROP-TETRAFLUOROETH EX AERO
1.0000 | INHALATION_SPRAY | CUTANEOUS | Status: DC | PRN
  Filled 2022-10-25: qty 116

## 2022-10-25 MED ORDER — ANTICOAGULANT SODIUM CITRATE 4% (200MG/5ML) IV SOLN
5.0000 mL | Status: DC | PRN
  Filled 2022-10-25: qty 5

## 2022-10-25 MED ORDER — SODIUM BICARBONATE 650 MG PO TABS
1300.0000 mg | ORAL_TABLET | Freq: Three times a day (TID) | ORAL | Status: DC
  Administered 2022-10-26 – 2022-11-02 (×23): 1300 mg via ORAL
  Filled 2022-10-25 (×23): qty 2

## 2022-10-25 MED ORDER — SODIUM CHLORIDE 0.9% IV SOLUTION: Freq: Once | INTRAVENOUS | Status: AC

## 2022-10-25 MED ORDER — DOCUSATE SODIUM 100 MG PO CAPS
100.0000 mg | ORAL_CAPSULE | Freq: Two times a day (BID) | ORAL | Status: DC
  Administered 2022-10-26 – 2022-11-01 (×14): 100 mg via ORAL
  Filled 2022-10-25 (×15): qty 1

## 2022-10-25 MED ORDER — OXYCODONE-ACETAMINOPHEN 5-325 MG PO TABS
1.0000 | ORAL_TABLET | Freq: Three times a day (TID) | ORAL | Status: DC | PRN
  Administered 2022-10-25 – 2022-11-02 (×19): 1 via ORAL
  Filled 2022-10-25 (×19): qty 1

## 2022-10-25 MED ORDER — ATORVASTATIN CALCIUM 10 MG PO TABS
10.0000 mg | ORAL_TABLET | Freq: Every day | ORAL | Status: DC
  Administered 2022-10-26 – 2022-11-02 (×8): 10 mg via ORAL
  Filled 2022-10-25 (×8): qty 1

## 2022-10-25 MED ORDER — PANTOPRAZOLE 80MG IVPB - SIMPLE MED
80.0000 mg | Freq: Two times a day (BID) | INTRAVENOUS | Status: DC
  Administered 2022-10-26 – 2022-10-27 (×3): 80 mg via INTRAVENOUS
  Filled 2022-10-25 (×5): qty 100

## 2022-10-25 MED ORDER — METHYLPREDNISOLONE SODIUM SUCC 125 MG IJ SOLR
80.0000 mg | Freq: Every day | INTRAMUSCULAR | Status: DC
  Administered 2022-10-26 – 2022-10-28 (×4): 80 mg via INTRAVENOUS
  Filled 2022-10-25 (×4): qty 2

## 2022-10-25 MED ORDER — HEPARIN SODIUM (PORCINE) 1000 UNIT/ML DIALYSIS
1000.0000 [IU] | INTRAMUSCULAR | Status: DC | PRN
  Filled 2022-10-25: qty 1

## 2022-10-25 MED ORDER — IPRATROPIUM-ALBUTEROL 0.5-2.5 (3) MG/3ML IN SOLN: 3.0000 mL | Freq: Four times a day (QID) | RESPIRATORY_TRACT | Status: DC | PRN

## 2022-10-25 MED ORDER — SODIUM CHLORIDE 0.9 % IV SOLN: 250.0000 mL | INTRAVENOUS | Status: DC | PRN

## 2022-10-25 MED ORDER — SODIUM CHLORIDE 0.9 % IV SOLN
1.0000 g | Freq: Once | INTRAVENOUS | Status: AC
  Administered 2022-10-25: 1 g via INTRAVENOUS
  Filled 2022-10-25: qty 10

## 2022-10-25 MED ORDER — SODIUM CHLORIDE 0.9 % IV SOLN
1.0000 g | INTRAVENOUS | Status: DC
  Administered 2022-10-26 – 2022-10-27 (×2): 1 g via INTRAVENOUS
  Filled 2022-10-25 (×3): qty 10

## 2022-10-25 MED ORDER — HYDRALAZINE HCL 20 MG/ML IJ SOLN: 10.0000 mg | Freq: Four times a day (QID) | INTRAMUSCULAR | Status: DC | PRN

## 2022-10-25 MED ORDER — ONDANSETRON HCL 4 MG/2ML IJ SOLN: 4.0000 mg | Freq: Four times a day (QID) | INTRAMUSCULAR | Status: DC | PRN

## 2022-10-25 MED ORDER — SODIUM BICARBONATE 8.4 % IV SOLN
INTRAVENOUS | Status: DC
Start: 2022-10-25 — End: 2022-10-25

## 2022-10-25 MED ORDER — SODIUM BICARBONATE 8.4 % IV SOLN
50.0000 meq | Freq: Once | INTRAVENOUS | Status: AC
  Administered 2022-10-25: 50 meq via INTRAVENOUS
  Filled 2022-10-25: qty 50

## 2022-10-25 MED ORDER — LIDOCAINE HCL (PF) 1 % IJ SOLN: 5.0000 mL | INTRAMUSCULAR | Status: DC | PRN

## 2022-10-25 MED ORDER — ALTEPLASE 2 MG IJ SOLR: 2.0000 mg | Freq: Once | INTRAMUSCULAR | Status: DC | PRN

## 2022-10-25 MED ORDER — PANTOPRAZOLE 80MG IVPB - SIMPLE MED
80.0000 mg | Freq: Once | INTRAVENOUS | Status: AC
  Administered 2022-10-25: 80 mg via INTRAVENOUS
  Filled 2022-10-25: qty 100

## 2022-10-25 MED ORDER — VANCOMYCIN VARIABLE DOSE PER UNSTABLE RENAL FUNCTION (PHARMACIST DOSING): Status: DC

## 2022-10-25 MED ORDER — SODIUM BICARBONATE 8.4 % IV SOLN
INTRAVENOUS | Status: AC
  Filled 2022-10-25: qty 1000

## 2022-10-25 MED ORDER — HEPARIN SODIUM (PORCINE) 1000 UNIT/ML DIALYSIS
20.0000 [IU]/kg | INTRAMUSCULAR | Status: DC | PRN
  Filled 2022-10-25: qty 2

## 2022-10-25 MED ORDER — SODIUM CHLORIDE 0.9% IV SOLUTION: Freq: Once | INTRAVENOUS | Status: DC

## 2022-10-25 MED ORDER — LEVALBUTEROL HCL 0.63 MG/3ML IN NEBU
0.6300 mg | INHALATION_SOLUTION | Freq: Four times a day (QID) | RESPIRATORY_TRACT | Status: DC | PRN
  Administered 2022-10-30 – 2022-10-31 (×2): 0.63 mg via RESPIRATORY_TRACT
  Filled 2022-10-25 (×2): qty 3

## 2022-10-25 MED ORDER — SODIUM CHLORIDE 0.9% FLUSH: 3.0000 mL | INTRAVENOUS | Status: DC | PRN

## 2022-10-25 MED ORDER — METOPROLOL TARTRATE 5 MG/5ML IV SOLN: 5.0000 mg | INTRAVENOUS | Status: DC | PRN

## 2022-10-25 MED ORDER — LIDOCAINE-PRILOCAINE 2.5-2.5 % EX CREA
1.0000 | TOPICAL_CREAM | CUTANEOUS | Status: DC | PRN
  Filled 2022-10-25: qty 5

## 2022-10-25 MED ORDER — PANTOPRAZOLE INFUSION (NEW) - SIMPLE MED
8.0000 mg/h | INTRAVENOUS | Status: DC
  Administered 2022-10-25 (×2): 8 mg/h via INTRAVENOUS
  Filled 2022-10-25: qty 100

## 2022-10-25 MED ORDER — HEPARIN SODIUM (PORCINE) 5000 UNIT/ML IJ SOLN: 5000.0000 [IU] | Freq: Three times a day (TID) | INTRAMUSCULAR | Status: DC

## 2022-10-25 MED ORDER — UMECLIDINIUM BROMIDE 62.5 MCG/ACT IN AEPB
1.0000 | INHALATION_SPRAY | Freq: Every day | RESPIRATORY_TRACT | Status: DC
Start: 2022-10-26 — End: 2022-10-25

## 2022-10-25 MED ORDER — IPRATROPIUM-ALBUTEROL 0.5-2.5 (3) MG/3ML IN SOLN
3.0000 mL | Freq: Four times a day (QID) | RESPIRATORY_TRACT | Status: AC
  Administered 2022-10-26 (×4): 3 mL via RESPIRATORY_TRACT
  Filled 2022-10-25 (×4): qty 3

## 2022-10-25 NOTE — ED Triage Notes (Signed)
Pt bib [facility] with complaints of shob and cp. Pt is supposed to have dialysis which she was started on in May but has not had treatment in multiple weeks. EMS found pt 88-90% on room air. Pt received albuterol neb en route with no relief. Pt placed on bipap on arrival to ED

## 2022-10-25 NOTE — ED Provider Notes (Signed)
  Physical Exam  BP (!) 132/57 (BP Location: Left Arm)   Pulse (!) 54   Temp 98.3 F (36.8 C) (Oral)   Resp 20   Ht 5\' 7"  (1.702 m)   Wt 46.2 kg   SpO2 97%   BMI 15.95 kg/m   Physical Exam Vitals and nursing note reviewed.  Constitutional:      General: She is not in acute distress.    Appearance: She is well-developed. She is ill-appearing.  HENT:     Head: Normocephalic and atraumatic.  Eyes:     Conjunctiva/sclera: Conjunctivae normal.  Cardiovascular:     Rate and Rhythm: Normal rate and regular rhythm.     Heart sounds: No murmur heard. Pulmonary:     Effort: Pulmonary effort is normal. No respiratory distress.     Breath sounds: Normal breath sounds.  Abdominal:     Palpations: Abdomen is soft.     Tenderness: There is no abdominal tenderness.  Musculoskeletal:        General: No swelling.     Cervical back: Neck supple.  Skin:    General: Skin is warm and dry.     Capillary Refill: Capillary refill takes less than 2 seconds.  Neurological:     Mental Status: She is alert.  Psychiatric:        Mood and Affect: Mood normal.     Procedures  Procedures  ED Course / MDM   Clinical Course as of 10/26/22 0107  [facility] Oct 25, 2022  1503 Watcher. Hx ESRD, permcath but has not received dialysis in weeks. Here w abdominal pain. Not volume up. Was in resp distress, on bipap, now off and stable on [place] I. Hx of anemia, Hgb 6.5, transfusion ordered. Hemoccult pending. Getting protonix. Nephro paged, not heard back. Getting belly scan.  [WL]    Clinical Course User Index [WL] Dyanne Iha, MD   Medical Decision Making Amount and/or Complexity of Data Reviewed Labs: ordered. Radiology: ordered.  Risk Prescription drug management. Decision regarding hospitalization.   Upon reassessment, patient resting comfortably with no new symptoms.  Now with temperature 100.2, tachycardic with rate in the low 100s.  Hemoccult resulted positive.  GI paged, to follow along while  inpatient.  Nephrology paged, patient to be dialyzed while inpatient.  CT imaging resulted with evidence of pneumonia.  Cultures drawn, started on broad-spectrum antibiotics.  Hospitalist paged for admission.  Please see inpatient provider notes for further details.       Dyanne Iha, MD 10/26/22 0107    Charlynne Pander, MD 10/27/22 [phone]
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
## 2022-10-25 NOTE — H&P (Incomplete)
History and Physical    [name] IRS:[id number] DOB: 1960-10-17 DOA: 10/25/2022  PCP: [name], MD   Patient coming from: Home   Chief Complaint:  Chief Complaint  Patient presents with   Shortness of Breath    HPI:  [name] is a 62 y.o. 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  Patient being started on hemodialysis in May however due to homeless status patient is unable to maintain dialysis and she could not get is decisional for last few weeks. During my evaluation patient is complaining about chronic back pain.  She is also complaining about abdominal pain as she has not eaten anything yet for last 2 days.  Denies any fever, chill, shortness of breath, palpitation, headache, sputum production, nausea, vomiting and diarrhea.   ED Course:  At presentation to ED patient found to be have low-grade fever 100.7 F, respiratory rate 25, tachycardic to 112, elevated blood pressure 169/89 and developed acute hypoxic respiratory failure O2 sat dropped to 82% currently maintaining 100% on HFNC 10 L/min. VBG showed 7 point 05/05/36/8.2 and elevated anion gap deficient 27. Normal lactic acid 1.6. CBC showed leukocytosis 12.5, low hemoglobin 6.5, low hematocrit 19.  3, RBC 1.93 and normal platelet 278. CMP showed low sodium 128 (baseline 128 to 133), potassium 3.5, chloride 102, bicarb 9, glucose 107, BUN 75, creatinine 4.8, elevated BUN 75, calcium 8, total protein 6.3, albumin 2.5, GFR 10 and gap 17. Lipase 41 within normal range. Low mag 1.6.  Blood culture x 2 in process.  Pending UA and urine culture.  Chest x-ray showed patchy  alveolar infiltrate mid right and mid lower lobe suggesting pneumonia.  No sign of alveolar pulmonary edema.  No pleural effusion or pneumothorax.  CT abdomen pelvis showed 1.left lower lobe consolidation with air bronchogram suspicion for pneumonia.  Gas within the urinary bladder, which demonstrates mild mural thickening and pericystic stranding, suspicious for cystitis. Recommend correlation with urinalysis. 3. Aortic Atherosclerosis (ICD10-I70.0). Coronary artery calcifications. Assessment for potential risk factor modification, dietary therapy or pharmacologic therapy may be warranted, if clinically indicated.  Due to low hemoglobin 6.5 in the ED patient has been transfusing with unit of blood. Due to low hemoglobin and fecal Hemoccult positive in the ED patient is started on Protonix drip.  EDP physician Dr. Chrissie Noa has been spoke with on-call GI Dr. Leone Payor, plan to evaluate in the a.m. With concern for left lower lobe pneumonia patient received cefepime and vancomycin in the ED. Patient has been evaluated by nephrologist Dr. Arlean Hopping.Per nephrology no need for dialysis today.  Plan for dialysis tomorrow 8/10.  Hospitalist has been consulted for further management and care.  Review of Systems:  Review of Systems  Constitutional:  Positive for fever and malaise/fatigue. Negative for chills and weight loss.  Respiratory:  Positive for cough, sputum production, shortness of breath and wheezing. Negative for hemoptysis.   Cardiovascular:  Positive for leg swelling. Negative for chest pain and orthopnea.       Shortness of breath  Gastrointestinal:  Positive for abdominal pain and nausea. Negative for blood in stool, constipation, diarrhea, heartburn and vomiting.  Genitourinary:  Negative for dysuria, frequency and urgency.  Musculoskeletal:  Positive for back pain. Negative for falls, joint pain, myalgias and neck pain.  Skin:  Negative for itching and rash.  Neurological:  Negative for  dizziness and headaches.  Psychiatric/Behavioral:  The patient is nervous/anxious.     Past Medical History:  Diagnosis Date   Alcohol abuse    Allergy    Anxiety    Arthritis    Asthma    Cannabis abuse    Cocaine abuse (HCC)    COPD (chronic obstructive pulmonary disease) (HCC)    Depression    Drug addiction (HCC)    GERD (gastroesophageal reflux disease)    Heart murmur    Homelessness    Hyperlipidemia    Hypertension    pt stated "every once in a while BP will be high but has not been prescribed medication for HTN.    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) 02/22/2016    Past Surgical History:  Procedure Laterality Date   AV FISTULA PLACEMENT Right 08/14/2022   Procedure: RIGHT ARM BRACHIOBASILIC ATERIOVENOUS FISTULA CREATION;  Surgeon: Leonie Douglas, MD;  Location: [facility] OR;  Service: Vascular;  Laterality: Right;   BIOPSY  01/01/2019   Procedure: BIOPSY;  Surgeon: Beverley Fiedler, MD;  Location: [facility] ENDOSCOPY;  Service: Endoscopy;;   BIOPSY  11/17/2021   Procedure: BIOPSY;  Surgeon: Lynann Bologna, MD;  Location: [facility] ENDOSCOPY;  Service: Gastroenterology;;   CESAREAN SECTION  1989   COLONOSCOPY  11/07/2020   2018   CYSTOSCOPY W/ URETERAL STENT PLACEMENT Left 08/01/2018   Procedure: CYSTOSCOPY WITH RETROGRADE PYELOGRAM/URETERAL STENT PLACEMENT;  Surgeon: Crista Elliot, MD;  Location: [facility] ORS;  Service: Urology;  Laterality: Left;   CYSTOSCOPY WITH RETROGRADE PYELOGRAM, URETEROSCOPY AND STENT PLACEMENT Left 01/15/2019   Procedure: CYSTOSCOPY WITH RETROGRADE PYELOGRAM, URETEROSCOPY AND STENT PLACEMENT;  Surgeon: Crista Elliot, MD;  Location: [facility] ORS;  Service: Urology;  Laterality: Left;   ENTEROSCOPY N/A 08/16/2022   Procedure: ENTEROSCOPY;  Surgeon: Meridee Score Netty Starring., MD;  Location: [facility] ENDOSCOPY;  Service: Gastroenterology;   Laterality: N/A;   ESOPHAGOGASTRODUODENOSCOPY (EGD) WITH PROPOFOL N/A 01/01/2019   Procedure: ESOPHAGOGASTRODUODENOSCOPY (EGD) WITH PROPOFOL;  Surgeon: Beverley Fiedler, MD;  Location: [facility] ENDOSCOPY;  Service: Endoscopy;  Laterality: N/A;   ESOPHAGOGASTRODUODENOSCOPY (EGD) WITH PROPOFOL N/A 08/21/2019   Procedure: ESOPHAGOGASTRODUODENOSCOPY (EGD) WITH PROPOFOL;  Surgeon: Napoleon Form, MD;  Location: [facility] ENDOSCOPY;  Service: Endoscopy;  Laterality: N/A;   ESOPHAGOGASTRODUODENOSCOPY (EGD) WITH PROPOFOL N/A 11/17/2021   Procedure: ESOPHAGOGASTRODUODENOSCOPY (EGD) WITH PROPOFOL;  Surgeon: Lynann Bologna, MD;  Location: [facility] ENDOSCOPY;  Service: Gastroenterology;  Laterality: N/A;   FRACTURE SURGERY Left 2011   arm   HEMOSTASIS CLIP PLACEMENT  08/16/2022   Procedure: HEMOSTASIS CLIP PLACEMENT;  Surgeon: Lemar Lofty., MD;  Location: [facility] ENDOSCOPY;  Service: Gastroenterology;;   HOT HEMOSTASIS N/A 08/16/2022   Procedure: HOT HEMOSTASIS (ARGON PLASMA COAGULATION/BICAP);  Surgeon: Lemar Lofty., MD;  Location: [facility] ENDOSCOPY;  Service: Gastroenterology;  Laterality: N/A;   INSERTION OF DIALYSIS CATHETER Right 08/14/2022   Procedure: INSERTION OF TUNNELED DIALYSIS CATHETER USING PALINDROME CATHETER KIT 19CM;  Surgeon: Leonie Douglas, MD;  Location: [facility] OR;  Service: Vascular;  Laterality: Right;   POLYPECTOMY  08/16/2022   Procedure: POLYPECTOMY;  Surgeon: Lemar Lofty., MD;  Location: [facility] ENDOSCOPY;  Service: Gastroenterology;;   Vicie Mutters  INJECTION  08/16/2022   Procedure: SUBMUCOSAL TATTOO INJECTION;  Surgeon: Lemar Lofty., MD;  Location: [facility] ENDOSCOPY;  Service: Gastroenterology;;   UPPER GASTROINTESTINAL ENDOSCOPY       reports that she has been smoking cigarettes. She has been exposed to tobacco smoke. She has never used smokeless tobacco. She reports that she does not currently use alcohol. She reports current drug use. Drug: Marijuana.  Allergies  Allergen  Reactions   Zestril [Lisinopril] Other (See Comments)    Hyperkalemia    Family History  Problem Relation Age of Onset   Diabetes Father    Colon cancer Neg Hx    Esophageal cancer Neg Hx    Rectal cancer Neg Hx    Stomach cancer Neg Hx     Prior to Admission medications   Medication Sig Start Date End Date Taking? Authorizing Provider  acetaminophen (TYLENOL) 500 MG tablet Take 1,000 mg by mouth 2 (two) times daily as needed for moderate pain, fever or headache.   Yes [provider]  albuterol (VENTOLIN HFA) 108 (90 Base) MCG/ACT inhaler INHALE 2 PUFFS INTO THE LUNGS EVERY 6 HOURS AS NEEDED FOR WHEEZING OR SHORTNESS OF BREATH. 01/21/22  Yes Claiborne Rigg, NP  busPIRone (BUSPAR) 10 MG tablet Take 1 tablet (10 mg total) by mouth 2 (two) times daily. 01/21/22  Yes Claiborne Rigg, NP  ferrous sulfate 325 (65 FE) MG tablet Take 1 tablet (325 mg total) by mouth daily with breakfast. 08/05/22  Yes Dahal, Melina Schools, MD  Fluticasone-Umeclidin-Vilant (TRELEGY ELLIPTA) 100-62.5-25 MCG/ACT AEPB Inhale 1 puff into the lungs daily. 01/21/22  Yes Claiborne Rigg, NP  gabapentin (NEURONTIN) 100 MG capsule Take 1 capsule (100 mg total) by mouth 3 (three) times daily. 01/21/22  Yes Claiborne Rigg, NP  ipratropium-albuterol (DUONEB) 0.5-2.5 (3) MG/3ML SOLN Take 3 mLs by nebulization every 6 (six) hours as needed (shortness of breath and wheezing). Patient taking differently: Take 3 mLs by nebulization every 6 (six) hours as needed (shortness of breath, wheezing). 01/21/22  Yes Claiborne Rigg, NP  Multiple Vitamin (MULTIVITAMIN WITH MINERALS) TABS tablet Take 1 tablet by mouth daily. 06/29/20  Yes [name], MD  oxyCODONE-acetaminophen (PERCOCET/ROXICET) 5-325 MG tablet Take 1 tablet by mouth every 8 (eight) hours as needed for severe pain or moderate pain. 08/05/22  Yes Dahal, Melina Schools, MD  pantoprazole (PROTONIX) 40 MG tablet Take 1 tablet (40 mg total) by mouth daily. 08/05/22  Yes Dahal,  Melina Schools, MD  traZODone (DESYREL) 50 MG tablet Take 1 tablet (50 mg total) by mouth at bedtime as needed for sleep. 01/21/22  Yes Claiborne Rigg, NP  furosemide (LASIX) 80 MG tablet Take 1 tablet (80 mg total) by mouth every Monday, Wednesday, and Friday. Patient not taking: Reported on 10/25/2022 08/21/22 08/21/23  Lanae Boast, MD  predniSONE (DELTASONE) 10 MG tablet Take 3 tablets by mouth daily for 2 days, then 2 tabs daily for 3 days, then 1 tab daily for 3 days, then stop. Patient not taking: Reported on 10/25/2022 08/20/22   Lanae Boast, MD  sodium bicarbonate 650 MG tablet Take 2 tablets (1,300 mg total) by mouth 3 (three) times daily. Patient not taking: Reported on 10/25/2022 01/25/22     sucralfate (CARAFATE) 1 g tablet Take 1 tablet (1 g total) by mouth 2 (two) times daily for 7 days. 08/20/22 08/27/22  Lanae Boast, MD     Physical Exam: Vitals:   10/26/22 1610 10/26/22 0243 10/26/22 0300 10/26/22 0500  BP: 105/63 (!) 113/58 (!) 119/58   Pulse:    (!) 101  Resp:    20  Temp:  98.6 F (37 C)    TempSrc:  Oral    SpO2:    94%  Weight:    44.7 kg  Height:        Physical Exam Constitutional:      General: She is not in acute distress.    Appearance: She is ill-appearing.  Cardiovascular:     Rate and Rhythm: Regular rhythm. Tachycardia present.     Heart sounds: No murmur heard. Pulmonary:     Effort: Tachypnea present. No respiratory distress.     Breath sounds: Examination of the right-middle field reveals wheezing. Examination of the left-middle field reveals wheezing. Examination of the right-lower field reveals decreased breath sounds and rhonchi. Examination of the left-lower field reveals decreased breath sounds and rhonchi. Decreased breath sounds, wheezing and rhonchi present. No rales.  Musculoskeletal:        General: Normal range of motion.     Right lower leg: No edema.     Left lower leg: No edema.  Skin:    Capillary Refill: Capillary refill takes less than 2 seconds.   Neurological:     Mental Status: She is oriented to person, place, and time.  Psychiatric:        Mood and Affect: Mood is anxious.        Behavior: Behavior is not agitated.      Labs on Admission: I have personally reviewed following labs and imaging studies  CBC: Recent Labs  Lab 10/25/22 1341 10/25/22 1351 10/25/22 1354 10/25/22 2053  WBC  --  12.5*  --  9.1  NEUTROABS  --  11.4*  --  8.2*  HGB 6.5*  6.5* 6.5* 7.1* 8.1*  HCT 19.0*  19.0* 19.3* 21.0* 24.3*  MCV  --  100.0  --  94.9  PLT  --  278  --  248   Basic Metabolic Panel: Recent Labs  Lab 10/25/22 1341 10/25/22 1351 10/25/22 1354 10/25/22 2053  NA 128*  129* 128* 129* 130*  K 3.5  3.5 3.5 3.5 3.4*  CL 105 102  --  102  CO2  --  9*  --  11*  GLUCOSE 106* 107*  --  99  BUN 79* 75*  --  74*  CREATININE 5.30* 4.80*  --  4.69*  CALCIUM  --  8.0*  --  7.8*  MG  --  1.6*  --   --   PHOS  --   --   --  5.8*   GFR: Estimated Creatinine Clearance: 8.8 mL/min (A) (by C-G formula based on SCr of 4.69 mg/dL (H)). Liver Function Tests: Recent Labs  Lab 10/25/22 1351 10/25/22 2053  AST 23  --   ALT 15  --   ALKPHOS 76  --   BILITOT 0.6  --   PROT 6.3*  --   ALBUMIN 2.5* 2.4*   Recent Labs  Lab 10/25/22 1351  LIPASE 41   No results for input(s): "AMMONIA" in the last 168 hours. Coagulation Profile: No results for input(s): "INR", "PROTIME" in the last 168 hours. Cardiac Enzymes: No results for input(s): "CKTOTAL", "CKMB", "CKMBINDEX", "TROPONINI", "TROPONINIHS" in the last 168 hours. BNP (last 3 results) Recent Labs    11/15/21 0529 08/12/22 1710  BNP 356.6* 1,084.9*   HbA1C: Recent Labs    10/25/22 2053  HGBA1C 4.6*   CBG: Recent Labs  Lab 10/26/22 0329 10/26/22 0607  GLUCAP 72 148*   Lipid Profile: Recent Labs    10/25/22 1351  CHOL 135  HDL 43  LDLCALC 70  TRIG 111  CHOLHDL 3.1   Thyroid Function Tests: No results for input(s): "TSH", "T4TOTAL", "FREET4", "T3FREE",  "THYROIDAB" in the last 72 hours. Anemia Panel: Recent Labs    10/25/22 1351 10/25/22 2053  VITAMINB12 420  --   FOLATE  --  8.1  FERRITIN  --  187  TIBC  --  200*  IRON  --  7*  RETICCTPCT  --  2.2   Urine analysis:    Component Value Date/Time   COLORURINE YELLOW 10/25/2022 2015   APPEARANCEUR HAZY (A) 10/25/2022 2015   LABSPEC 1.009 10/25/2022 2015   PHURINE 5.0 10/25/2022 2015   GLUCOSEU 50 (A) 10/25/2022 2015   HGBUR LARGE (A) 10/25/2022 2015   BILIRUBINUR NEGATIVE 10/25/2022 2015   KETONESUR NEGATIVE 10/25/2022 2015   PROTEINUR 100 (A) 10/25/2022 2015   UROBILINOGEN 0.2 03/02/2010 2027   NITRITE POSITIVE (A) 10/25/2022 2015   LEUKOCYTESUR MODERATE (A) 10/25/2022 2015    Radiological Exams on Admission: I have personally reviewed images CT ABDOMEN PELVIS WO CONTRAST  Result Date: 10/25/2022 CLINICAL DATA:  Shortness of breath and chest pain EXAM: CT ABDOMEN AND PELVIS WITHOUT CONTRAST TECHNIQUE: Multidetector CT imaging of the abdomen and pelvis was performed following the standard protocol without IV contrast. RADIATION DOSE REDUCTION: This exam was performed according to the departmental dose-optimization program which includes automated exposure control, adjustment of the mA and/or kV according to patient size and/or use of iterative reconstruction technique. COMPARISON:  CT abdomen and pelvis dated 12/31/2018 FINDINGS: Lower chest: Left lower lobe consolidation with air bronchograms. No pleural effusion or pneumothorax demonstrated. Partially imaged heart size is normal. Coronary artery calcifications. Hepatobiliary: Subcentimeter segment 3 hypodensities, too small to characterize, but unchanged from 12/31/2018. No intra or extrahepatic biliary ductal dilation. Normal gallbladder. Pancreas: No focal lesions or main ductal dilation. Spleen: Normal in size without focal abnormality. Adrenals/Urinary Tract: No adrenal nodules. 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 No  suspicious renal mass, calculi or hydronephrosis. Gas within the urinary bladder, which demonstrates mild mural thickening and pericystic stranding. Stomach/Bowel: Normal appearance of the stomach. No evidence of bowel wall thickening, distention, or inflammatory changes. Normal appendix. Vascular/Lymphatic: Aortic atherosclerosis. No enlarged abdominal or pelvic lymph nodes. Reproductive: No adnexal masses. Other: Trace free fluid.  No fluid collection or free air. Musculoskeletal: No acute or abnormal lytic or blastic osseous lesions. Multilevel degenerative changes of the partially imaged thoracic and lumbar spine. IMPRESSION: 1. Left lower lobe consolidation with air bronchograms, suspicious for pneumonia. 2. Gas within the urinary bladder, which demonstrates mild mural thickening and pericystic stranding, suspicious for cystitis. Recommend correlation with urinalysis. 3. Aortic Atherosclerosis (ICD10-I70.0). Coronary artery calcifications. Assessment for potential risk factor modification, dietary therapy or pharmacologic therapy may be warranted, if clinically indicated. Electronically Signed   By: Agustin Cree M.D.   On: 10/25/2022 16:22   DG Chest Portable 1 View  Result Date: 10/25/2022 CLINICAL DATA:  Shortness of breath EXAM: PORTABLE CHEST 1 VIEW COMPARISON:  Previous studies including the examination of 08/19/2022 FINDINGS: Transverse diameter of heart is slightly increased. Tip of right IJ dialysis catheter is seen in the region of superior vena cava. There is interval clearing of pulmonary vascular congestion and pulmonary edema. There is focal patchy infiltrate in right mid and right lower lung fields suggesting pneumonia. Lateral CP  angles are clear. There is no pneumothorax. IMPRESSION: There are patchy alveolar infiltrates in right mid and right lower lung fields suggesting pneumonia. There are no signs of alveolar pulmonary edema. There is no pleural effusion or pneumothorax. Electronically  Signed   By: [name] Avena M.D.   On: 10/25/2022 15:03    EKG: My personal interpretation of EKG shows: Sinus tachycardia heart rate 102.  Premature atrial complex.  No ST and T wave abnormality.    Assessment/Plan: Principal Problem:   Right lower lobe pneumonia Active Problems:   Metabolic acidosis   Chronic hyponatremia   Acute on chronic blood loss anemia   Acute hypoxic respiratory failure (HCC)   ESRD on dialysis-noncompliant with outpatient dialysis due to homeless [facility])   History of arteriovenous malformation (AVM)   COPD with acute exacerbation (HCC)   SIRS (systemic inflammatory response syndrome) (HCC)   Essential hypertension   Hepatitis C   Chronic disease anemia   GERD (gastroesophageal reflux disease)   Chronic diastolic CHF (congestive heart failure) (HCC)   Premature atrial complex due to COPD exacerbation and acute hypoxic respiratory failure   Chronic pain syndrome    Assessment and Plan: Acute hypoxic respiratory failure due to left lower lobe pneumonia and COPD exacerbation COPD exacerbation secondary to pneumonia -Patient coming with complaining of shortness of breath.  In the ED O2 sat dropped to 80% on room air.  Currently on  7 L HFNC. - VBG showed pH 7.2, low pCO2 18, O2 sat at 38% WNL and low bicarb 8.2.  Elevated anion gap 27.  Lactic acid within normal range. -Chest x-ray showed patchy alveolar infiltrate in the right mid and right lower lung field suggesting pneumonia.  No pleural effusion or pneumothorax. -CT abdomen pelvis showed left lower lobe consolidation suspicious for pneumonia. -Physical exam showed patient has bilateral mid lung wheezing.  Patient has productive cough.  Concern for COPD exacerbation in the setting of left lower lobe pneumonia. - Starting Solu-Medrol 80 mg IV twice daily for 5 days. - Continue DuoNeb every 6 hours scheduled and Xopenex every 6 hours as needed for wheezing or shortness of breath. -Check pulse oxygen  continuously and maintain high flow nasal cannula oxygen 6 to 8 L to maintain O2 sat above 89% while awake and 92% while sleeping.  Wean down oxygen as patient tolerates.  Consulted respiratory therapy for management of oxygen.-Rechecking ABG in 6 hours. - Continue cardiac monitoring - Continue incentive spirometry and flutter valve.  SIRS Right-sided mid lobe and lower lobe pneumonia Left lower lobe pneumonia -CT abdomen pelvis showed incidental finding of left lower lobe pneumonia. Chest x-ray showed right lower lobe infiltrate concern for pneumonia. -CBC shows slight leukocytosis 12.  Low-grade temperature 100.2 F.  Tachycardic 103.  Patient meets SIRS criteria. - In the ED patient has been treated with cefepime and vancomycin. - Given patient had recent hospital admission 2 months ago risk for development of Staph aureus and Pseudomonas within pneumonia - Continue vancomycin and cefepime per pharmacy protocol - Pending blood cultures x 2, checking respiratory panel and sputum culture.  Checking urine strep pneumonia and urine Legionella antigen (patient still does make urine) -Continue to monitor fever curve. -Checking morning CBC -Will follow-up with blood and sputum culture result for antibiotic guidance.  ESRD on on dialysis (noncompliance with dialysis for last couple of weeks) -Patient is noncompliant with dialysis due to homeless.  She has been started on HD in May 2024.  Patient has right chest PermCath  in place.  No evidence of external infection. - Nephrologist Dr. Arlean Hopping has been consulted plan to dialysis in the a.m. -Checking PTH, hydroxy vitamin D, hep B surface antigen, hep B core antibody and hep C antibody. - Continue oral iron supplement -Continue sodium bicarb 1300 mg 3 times daily - Continue renal diet   Anion gap metabolic acidosis  -Low bicarb 7 and elevated anion gap 27 - Patient is noncompliant with oral sodium bicarb tab as she was not supposed to take 1300  mg 3 times daily. - Starting bicarb drip 75 cc/h for 10 hours and resuming sodium bicarb tab 1300 mg 3 times daily. -Continue renal diet.  Chronic hyponatremia - Serum sodium 128.  Baseline around 1 28-1 33. - Chronic hyponatremia in the setting of chronic noncompliance with dialysis. - Plan for dialysis in the a.m.  Hoping that with dialysis sodium level will normalize. -Checking morning CMP. - Renal diet with 1.5 L fluid restriction.  Hypomagnesemia - Low mag 1.6.  Normal potassium 3.5. -Replating with mag sulfate 2 g IV.   Premature atrial complex due to COPD exacerbation and acute hypoxic respiratory failure History of diastolic heart failure with preserved EF 55 to 60% -EKG showed premature atrial complex in the setting of COPD exacerbation and acute hypoxic respiratory failure.  Patient denies any palpitation and chest pain. - Per chart review previous echo from 07/24/2022 EF 55 to 60% and grade 1 diastolic heart failure. -Continue cardiac monitoring. - Continue Lopressor 5 mg as needed for heart rate above 120. -Continue cardiac monitoring.  Acute on chronic anemia Lower GI bleed -suspecting from underlying AVM malformation History of chronic anemia History of GI AVF malformation -Patient has history of AVF malformation with on and off GI bleed. - On presentation hemoglobin 6.5 and fecal occult blood test is positive. - In the ED patient has been given 1 unit of blood.  Rechecking hemoglobin again. - Continue to monitor H&H 3 times daily and will transfuse as needed if hemoglobin drops less than 7 - Protonix drip has been transitioned to IV Protonix 80 mg twice daily. - EDP physician discussed case with GI Dr. Leone Payor plan to evaluate in the a.m.  History of GERD - Checking fecal H. pylori antigen. - Continue Protonix.   Essential hypertension Diastolic heart pro preserved EF 55 to 60% Not on any home blood pressure regimen currently except Lasix 80 mg 3 times weekly.   Patient still does make urine even ESRD. -Patient's blood pressure 166/89 which has been improved to 158/67.  However patient is persistently tachycardic 101. - EKG showed sinus tachycardia with premature atrial complex.  No ST and T wave abnormality. -Holding Lasix as patient is euvolemic on physical exam. -Plan for dialysis in the a.m. -Continue hydralazine every 6 hours as needed for systolic blood pressure more than 160 or diastolic blood pressure more than 110. -Cardiac monitoring  Chronic pain syndrome -Resumed home gabapentin 100 mg 3 times daily and Percocet 5 mg every 8 hours as needed.  History of hepatitis C -Unclear chronicity.  Checking hepatitis panel  Aortic atherosclerosis Coronary artery calcification - CTA abdomen pelvis showed aortic artery sclerosis.  Coronary artery calcification.  Recommended assessment for potential risk factor modification, dietary therapy and pharmacological therapy. -Checking lipid panel - Starting Lipitor 10 mg daily   Homeless Medication noncompliance Noncompliance with dialysis. -Consulted case management for medication assistance and need to also reach out dialysis case management for outpatient dialysis arrangement.  DVT prophylaxis:  SCD.  Holding pharmacological prophylaxis in the setting of GI bleed Code Status:  Full Code Diet: Renal diet Family Communication: No family member able to reach out.  Discussed treatment plan with patient Disposition Plan: Pain clinical improvement, shortness of breath.  Pending blood culture and sputum culture for antibiotic guidance.  Plan to discharge in next 3 to 5 days. Consults: Nephrology and gastroenterology Admission status:   Inpatient, Step Down Unit  Severity of Illness: The appropriate patient status for this patient is INPATIENT. Inpatient status is judged to be reasonable and necessary in order to provide the required intensity of service to ensure the patient's safety. The patient's  presenting symptoms, physical exam findings, and initial radiographic and laboratory data in the context of their chronic comorbidities is felt to place them at high risk for further clinical deterioration. Furthermore, it is not anticipated that the patient will be medically stable for discharge from the hospital within 2 midnights of admission.   * I certify that at the point of admission it is my clinical judgment that the patient will require inpatient hospital care spanning beyond 2 midnights from the point of admission due to high intensity of service, high risk for further deterioration and high frequency of surveillance required.Marland Kitchen    Tereasa Coop, MD [facility]  How to contact the [facility] Attending or Consulting provider 7A - 7P or covering provider during after hours 7P -7A, for this patient.  Check the care team in [facility] and look for a) attending/consulting [facility] provider listed and b) the [facility] team listed Log into [email or web address] and use [facility] universal password to access. If you do not have the password, please contact the hospital operator. Locate the [facility] provider you are looking for under [facility] and page to a number that you can be directly reached. If you still have difficulty reaching the provider, please page the [facility] (Director on Call) for the Hospitalists listed on amion for assistance.  10/26/2022, 6:10 AM

## 2022-10-25 NOTE — Progress Notes (Signed)
Pharmacy Antibiotic Note  [name] is a 62 y.o. female for which pharmacy has been consulted for cefepime and vancomycin dosing for pneumonia.  Patient with a history of ESRD on HD, HTN, PAD, GED, anemia, COPD, hepatitis C, HLD, hx of GIB. Patient presenting with SOB and chest pain.  CXR concerning for pna  HD started May 2024  WBC 12.5; LA 1.6; T 100.1; HR 101; RR 30  Plan: Vancomycin 1g given x 1 in ED --- repeat dosing per HD schedule. Pharmacy to monitor. Cefepime 1g q24hr (dose to be given post-HD on HD days) Monitor WBC, fever, renal function, cultures De-escalate when able F/u Nephrology plan  Height: 5\' 7"  (170.2 cm) Weight: 44.5 kg (98 lb) IBW/kg (Calculated) : 61.6  Temp (24hrs), Avg:100 F (37.8 C), Min:99.1 F (37.3 C), Max:100.7 F (38.2 C)  Recent Labs  Lab 10/25/22 1341 10/25/22 1342 10/25/22 1351 10/25/22 2017  WBC  --   --  12.5*  --   CREATININE 5.30*  --  4.80*  --   LATICACIDVEN  --  1.6  --  1.0    Estimated Creatinine Clearance: 8.5 mL/min (A) (by C-G formula based on SCr of 4.8 mg/dL (H)).    Allergies  Allergen Reactions   Zestril [Lisinopril] Other (See Comments)    Hyperkalemia   Microbiology results: Pending  Thank you for allowing pharmacy to be a part of this patient's care.  Delmar Landau, PharmD, BCPS 10/25/2022 8:55 PM ED Clinical Pharmacist -  [phone]

## 2022-10-25 NOTE — Progress Notes (Signed)
ED Pharmacy Antibiotic Sign Off An antibiotic consult was received from an ED provider for cefepime per pharmacy dosing for pneumonia. A chart review was completed to assess appropriateness.  A single dose of vancomycin 1000 mg was placed by the ED provider.   The following one time order(s) were placed per pharmacy consult:  cefepime 1000 mg x 1 dose  Further antibiotic and/or antibiotic pharmacy consults should be ordered by the admitting provider if indicated.   Thank you for allowing pharmacy to be a part of this patient's care.   Delmar Landau, PharmD, BCPS 10/25/2022 6:06 PM ED Clinical Pharmacist -  [phone]

## 2022-10-25 NOTE — Consult Note (Signed)
Renal Service Consult Note [facility]  [name] 10/25/2022 [name], MD Requesting Physician: Dr. Silverio Lay  Reason for Consult: ESRD pt missed HD x 3 wks, homeless HPI: The patient is a 62 y.o. year-old w/ PMH as below who presented to ED for SOB and CP. She started HD in May but is now homeless so couldn't get to HD for the last few weeks. In ED pt was stable, 90% SpO2 on RA, no distress, labs aren't bad for missing that much dialysis. Pt to be admitted. We are asked to see for dialysis.   Pt seen in ED, she says she has been homeless and not eating much, no SOB, no abdominal or LE swelling. No confusion or jerking. Still makes urine.   ROS - denies CP, no joint pain, no HA, no blurry vision, no rash, no diarrhea, no nausea/ vomiting, no dysuria, no difficulty voiding   Past Medical History  Past Medical History:  Diagnosis Date   Alcohol abuse    Allergy    Anxiety    Arthritis    Asthma    Cannabis abuse    Cocaine abuse (HCC)    COPD (chronic obstructive pulmonary disease) (HCC)    Depression    Drug addiction (HCC)    GERD (gastroesophageal reflux disease)    Heart murmur    Homelessness    Hyperlipidemia    Hypertension    pt stated "every once in a while BP will be high but has not been prescribed medication for HTN.    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) 02/22/2016   Past Surgical History  Past Surgical History:  Procedure Laterality Date   AV FISTULA PLACEMENT Right 08/14/2022   Procedure: RIGHT ARM BRACHIOBASILIC ATERIOVENOUS FISTULA CREATION;  Surgeon: Leonie Douglas, MD;  Location[facility] OR;  Service: Vascular;  Laterality: Right;   BIOPSY  01/01/2019   Procedure: BIOPSY;  Surgeon: Beverley Fiedler, MD;  Location: [facility] ENDOSCOPY;  Service: Endoscopy;;   BIOPSY  11/17/2021   Procedure: BIOPSY;  Surgeon: Lynann Bologna, MD;  Location: [facility] ENDOSCOPY;  Service: Gastroenterology;;   CESAREAN SECTION  1989   COLONOSCOPY  11/07/2020   2018   CYSTOSCOPY W/ URETERAL STENT PLACEMENT Left 08/01/2018   Procedure: CYSTOSCOPY WITH RETROGRADE PYELOGRAM/URETERAL STENT PLACEMENT;  Surgeon: Crista Elliot, MD;  Location: [facility] ORS;  Service: Urology;  Laterality: Left;   CYSTOSCOPY WITH RETROGRADE PYELOGRAM, URETEROSCOPY AND STENT PLACEMENT Left 01/15/2019   Procedure: CYSTOSCOPY WITH RETROGRADE PYELOGRAM, URETEROSCOPY AND STENT PLACEMENT;  Surgeon: Crista Elliot, MD;  Location: [facility] ORS;  Service: Urology;  Laterality: Left;   ENTEROSCOPY N/A 08/16/2022   Procedure: ENTEROSCOPY;  Surgeon: Meridee Score Netty Starring., MD;  Location[facility] ENDOSCOPY;  Service: Gastroenterology;  Laterality: N/A;   ESOPHAGOGASTRODUODENOSCOPY (EGD) WITH PROPOFOL N/A 01/01/2019   Procedure: ESOPHAGOGASTRODUODENOSCOPY (EGD) WITH PROPOFOL;  Surgeon: Beverley Fiedler, MD;  Location: [facility] ENDOSCOPY;  Service: Endoscopy;  Laterality: N/A;   ESOPHAGOGASTRODUODENOSCOPY (EGD) WITH PROPOFOL N/A 08/21/2019   Procedure: ESOPHAGOGASTRODUODENOSCOPY (EGD) WITH PROPOFOL;  Surgeon: Napoleon Form, MD;  Location: [facility] ENDOSCOPY;  Service: Endoscopy;  Laterality: N/A;   ESOPHAGOGASTRODUODENOSCOPY (EGD) WITH PROPOFOL N/A 11/17/2021   Procedure: ESOPHAGOGASTRODUODENOSCOPY (EGD) WITH PROPOFOL;  Surgeon: Lynann Bologna, MD;  Location: [facility] ENDOSCOPY;  Service: Gastroenterology;  Laterality: N/A;   FRACTURE  SURGERY Left 2011   arm   HEMOSTASIS CLIP PLACEMENT  08/16/2022   Procedure: HEMOSTASIS CLIP PLACEMENT;  Surgeon: Lemar Lofty., MD;  Location: [facility] ENDOSCOPY;  Service: Gastroenterology;;   HOT HEMOSTASIS N/A 08/16/2022   Procedure: HOT HEMOSTASIS (ARGON PLASMA COAGULATION/BICAP);  Surgeon: Lemar Lofty., MD;  Location: [facility] ENDOSCOPY;  Service: Gastroenterology;  Laterality: N/A;   INSERTION OF DIALYSIS CATHETER Right 08/14/2022   Procedure: INSERTION OF  TUNNELED DIALYSIS CATHETER USING PALINDROME CATHETER KIT 19CM;  Surgeon: Leonie Douglas, MD;  Location: [facility] OR;  Service: Vascular;  Laterality: Right;   POLYPECTOMY  08/16/2022   Procedure: POLYPECTOMY;  Surgeon: Meridee Score Netty Starring., MD;  Location: [facility] ENDOSCOPY;  Service: Gastroenterology;;   SUBMUCOSAL TATTOO INJECTION  08/16/2022   Procedure: SUBMUCOSAL TATTOO INJECTION;  Surgeon: Lemar Lofty., MD;  Location: [facility] ENDOSCOPY;  Service: Gastroenterology;;   UPPER GASTROINTESTINAL ENDOSCOPY     Family History  Family History  Problem Relation Age of Onset   Diabetes Father    Colon cancer Neg Hx    Esophageal cancer Neg Hx    Rectal cancer Neg Hx    Stomach cancer Neg Hx    Social History  reports that she has been smoking cigarettes. She has been exposed to tobacco smoke. She has never used smokeless tobacco. She reports that she does not currently use alcohol. She reports current drug use. Drug: Marijuana. Allergies  Allergies  Allergen Reactions   Zestril [Lisinopril] Other (See Comments)    Hyperkalemia   Home medications Prior to Admission medications   Medication Sig Start Date End Date Taking? Authorizing Provider  acetaminophen (TYLENOL) 500 MG tablet Take 1,000 mg by mouth 2 (two) times daily as needed for moderate pain, fever or headache.    [provider]  albuterol (VENTOLIN HFA) 108 (90 Base) MCG/ACT inhaler INHALE 2 PUFFS INTO THE LUNGS EVERY 6 HOURS AS NEEDED FOR WHEEZING OR SHORTNESS OF BREATH. 01/21/22   Claiborne Rigg, NP  busPIRone (BUSPAR) 10 MG tablet Take 1 tablet (10 mg total) by mouth 2 (two) times daily. 01/21/22   Claiborne Rigg, NP  ferrous sulfate 325 (65 FE) MG tablet Take 1 tablet (325 mg total) by mouth daily with breakfast. 08/05/22   Dahal, Melina Schools, MD  Fluticasone-Umeclidin-Vilant (TRELEGY ELLIPTA) 100-62.5-25 MCG/ACT AEPB Inhale 1 puff into the lungs daily. 01/21/22   Claiborne Rigg, NP  furosemide (LASIX) 80 MG tablet Take 1  tablet (80 mg total) by mouth every Monday, Wednesday, and Friday. 08/21/22 08/21/23  Lanae Boast, MD  gabapentin (NEURONTIN) 100 MG capsule Take 1 capsule (100 mg total) by mouth 3 (three) times daily. 01/21/22   Claiborne Rigg, NP  ipratropium-albuterol (DUONEB) 0.5-2.5 (3) MG/3ML SOLN Take 3 mLs by nebulization every 6 (six) hours as needed (shortness of breath and wheezing). Patient taking differently: Take 3 mLs by nebulization every 6 (six) hours as needed (shortness of breath, wheezing). 01/21/22   Claiborne Rigg, NP  Multiple Vitamin (MULTIVITAMIN WITH MINERALS) TABS tablet Take 1 tablet by mouth daily. 06/29/20   [name], MD  oxyCODONE-acetaminophen (PERCOCET/ROXICET) 5-325 MG tablet Take 1 tablet by mouth every 8 (eight) hours as needed for severe pain or moderate pain. Patient not taking: Reported on 10/01/2022 08/05/22   Lorin Glass, MD  pantoprazole (PROTONIX) 40 MG tablet Take 1 tablet (40 mg total) by mouth daily. 08/05/22   Lorin Glass, MD  predniSONE (DELTASONE) 10 MG tablet Take 3 tablets by mouth daily for  2 days, then 2 tabs daily for 3 days, then 1 tab daily for 3 days, then stop. 08/20/22   Lanae Boast, MD  sodium bicarbonate 650 MG tablet Take 2 tablets (1,300 mg total) by mouth 3 (three) times daily. 01/25/22     sucralfate (CARAFATE) 1 g tablet Take 1 tablet (1 g total) by mouth 2 (two) times daily for 7 days. 08/20/22 08/27/22  Lanae Boast, MD  traZODone (DESYREL) 50 MG tablet Take 1 tablet (50 mg total) by mouth at bedtime as needed for sleep. 01/21/22   Claiborne Rigg, NP     Vitals:   10/25/22 1335 10/25/22 1339 10/25/22 1340 10/25/22 1409  BP:      Pulse: 60  87   Resp: (!) 25  (!) 25   Temp:  99.1 F (37.3 C)    TempSrc:  Axillary    SpO2: (!) 82%  100% 100%   Exam Gen alert, no distress, disheveled, quite thin No rash, cyanosis or gangrene Sclera anicteric, throat clear  No jvd or bruits Chest clear bilat to bases, no rales/ wheezing RRR no RG Abd  soft ntnd no mass or ascites +bs GU defer MS no joint effusions or deformity Ext no LE or UE edema, no wounds or ulcers Neuro is alert, Ox 3 , nf    RIJ TDC , RUE AVF +bruit      Home meds include - buspar, trelegy ellipta, lasix 80mg  mwf, gabapentin 100 tid, duoneb, MVI, percocet prn, protonix, prednisone taper, sod bicarb, carafate 1gm bid x 7 days, trazodone     OP HD: started in May, nothing comes up in provider Hub    RIJ TDC intact, RUA AVF +bruit   Assessment/ Plan: Missed dialysis - +SOB but CXR suggesting PNA not edema, not vol overloaded on exam. BP's are up a bit.    ESRD - not sure what her schedule was. She started it looks like in June here. Nothing in ecube today. Creat is not real high at 4. Plan HD tonight or tomorrow.  HTN/ volume - BP's up 170 / 90 range. UF goal 2.5 L as tolerated. BP meds per pmd.  Anemia esrd - Hb low around 6-7.5, will need transfusion. Can give blood w/ HD, or can give it by itself as she does not appear vol overloaded.  MBD ckd - CCa in range, add on phos. Consider binder.       Vinson Moselle  MD [facility] 10/25/2022, 4:33 PM  Recent Labs  Lab 10/25/22 1341 10/25/22 1351 10/25/22 1354  HGB 6.5*  6.5* 6.5* 7.1*  ALBUMIN  --  2.5*  --   CALCIUM  --  8.0*  --   CREATININE 5.30* 4.80*  --   K 3.5  3.5 3.5 3.5   Inpatient medications:  sodium chloride   Intravenous Once    pantoprazole 80 mg (10/25/22 1626)   pantoprazole 8 mg/hr (10/25/22 1625)

## 2022-10-25 NOTE — ED Notes (Signed)
ED TO INPATIENT HANDOFF REPORT  ED Nurse Name and Phone #:  Marcello Moores [phone]  S Name/Age/Gender [name] 62 y.o. female Room/Bed: 039C/039C  Code Status   Code Status: Full Code  Home/SNF/Other [place] [facility] Patient oriented to: self, place, time, and situation Is this baseline? Yes   Triage Complete: Triage complete  Chief Complaint Left lower lobe pneumonia [J18.9]  Triage Note Pt bib [facility] with complaints of shob and cp. Pt is supposed to have dialysis which she was started on in May but has not had treatment in multiple weeks. EMS found pt 88-90% on room air. Pt received albuterol neb en route with no relief. Pt placed on bipap on arrival to ED   Allergies Allergies  Allergen Reactions   Zestril [Lisinopril] Other (See Comments)    Hyperkalemia    Level of Care/Admitting Diagnosis ED Disposition     ED Disposition  Admit   Condition  --   Comment  Hospital [facility] [[id number]]  Level of Care: Progressive [102]  Admit to Progressive based on following criteria: RESPIRATORY PROBLEMS hypoxemic/hypercapnic respiratory failure that is responsive to NIPPV (BiPAP) or High Flow Nasal Cannula (6-80 lpm). Frequent assessment/intervention, no > Q2 hrs < Q4 hrs, to maintain oxygenation and pulmonary hygiene.  Admit to Progressive based on following criteria: Other see comments  Comments: Acute hypoxic respiratory failure  May admit patient to [facility] or [facility] if equivalent level of care is available:: No  Covid Evaluation: Symptomatic Person Under Investigation (PUI) or recent exposure (last 10 days) *Testing Required*  Diagnosis: Left lower lobe pneumonia [025427]  Admitting Physician: Tereasa Coop [[id number]]  Attending Physician: Tereasa Coop [[id number]]  Certification:: I certify this patient will need inpatient services for at least 2 midnights  Estimated Length of Stay: 5          B Medical/Surgery History Past Medical  History:  Diagnosis Date   Alcohol abuse    Allergy    Anxiety    Arthritis    Asthma    Cannabis abuse    Cocaine abuse (HCC)    COPD (chronic obstructive pulmonary disease) (HCC)    Depression    Drug addiction (HCC)    GERD (gastroesophageal reflux disease)    Heart murmur    Homelessness    Hyperlipidemia    Hypertension    pt stated "every once in a while BP will be high but has not been prescribed medication for HTN.    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) 02/22/2016   Past Surgical History:  Procedure Laterality Date   AV FISTULA PLACEMENT Right 08/14/2022   Procedure: RIGHT ARM BRACHIOBASILIC ATERIOVENOUS FISTULA CREATION;  Surgeon: Leonie Douglas, MD;  [facility] OR;  Service: Vascular;  Laterality: Right;   BIOPSY  01/01/2019   Procedure: BIOPSY;  Surgeon: Beverley Fiedler, MD;  Location: [facility] ENDOSCOPY;  Service: Endoscopy;;   BIOPSY  11/17/2021   Procedure: BIOPSY;  Surgeon: Lynann Bologna, MD;  Location: [facility] ENDOSCOPY;  Service: Gastroenterology;;   CESAREAN SECTION  1989   COLONOSCOPY  11/07/2020   2018   CYSTOSCOPY W/ URETERAL STENT PLACEMENT Left 08/01/2018   Procedure: CYSTOSCOPY WITH RETROGRADE PYELOGRAM/URETERAL STENT PLACEMENT;  Surgeon: Crista Elliot, MD;  Location: [facility] ORS;  Service: Urology;  Laterality: Left;   CYSTOSCOPY  WITH RETROGRADE PYELOGRAM, URETEROSCOPY AND STENT PLACEMENT Left 01/15/2019   Procedure: CYSTOSCOPY WITH RETROGRADE PYELOGRAM, URETEROSCOPY AND STENT PLACEMENT;  Surgeon: Crista Elliot, MD;  Location: [facility] ORS;  Service: Urology;  Laterality: Left;   ENTEROSCOPY N/A 08/16/2022   Procedure: ENTEROSCOPY;  Surgeon: Meridee Score Netty Starring., MD;  Location: [facility] ENDOSCOPY;  Service: Gastroenterology;  Laterality: N/A;   ESOPHAGOGASTRODUODENOSCOPY (EGD) WITH PROPOFOL N/A 01/01/2019   Procedure:  ESOPHAGOGASTRODUODENOSCOPY (EGD) WITH PROPOFOL;  Surgeon: Beverley Fiedler, MD;  Location: [facility] ENDOSCOPY;  Service: Endoscopy;  Laterality: N/A;   ESOPHAGOGASTRODUODENOSCOPY (EGD) WITH PROPOFOL N/A 08/21/2019   Procedure: ESOPHAGOGASTRODUODENOSCOPY (EGD) WITH PROPOFOL;  Surgeon: Napoleon Form, MD;  Location: [facility] ENDOS[facility];  Service: Endoscopy;  Laterality: N/A;   ESOPHAGOGASTRODUODENOSCOPY (EGD) WITH PROPOFOL N/A 11/17/2021   Procedure: ESOPHAGOGASTRODUODENOSCOPY (EGD) WITH PROPOFOL;  Surgeon: Lynann Bologna, MD;  Location: [facility] ENDOSCOPY;  Service: Gastroenterology;  Laterality: N/A;   FRACTURE SURGERY Left 2011   arm   HEMOSTASIS CLIP PLACEMENT  08/16/2022   Procedure: HEMOSTASIS CLIP PLACEMENT;  Surgeon: Lemar Lofty., MD;  Location: [facility] ENDOSCOPY;  Service: Gastroenterology;;   HOT HEMOSTASIS N/A 08/16/2022   Procedure: HOT HEMOSTASIS (ARGON PLASMA COAGULATION/BICAP);  Surgeon: Lemar Lofty., MD;  Location: [facility] ENDOSCOPY;  Service: Gastroenterology;  Laterality: N/A;   INSERTION OF DIALYSIS CATHETER Right 08/14/2022   Procedure: INSERTION OF TUNNELED DIALYSIS CATHETER USING PALINDROME CATHETER KIT 19CM;  Surgeon: Leonie Douglas, MD;  Location: [facility] OR;  Service: Vascular;  Laterality: Right;   POLYPECTOMY  08/16/2022   Procedure: POLYPECTOMY;  Surgeon: Lemar Lofty., MD;  Location: [facility] ENDOSCOPY;  Service: Gastroenterology;;   SUBMUCOSAL TATTOO INJECTION  08/16/2022   Procedure: SUBMUCOSAL TATTOO INJECTION;  Surgeon: Lemar Lofty., MD;  Location: [facility] ENDOSCOPY;  Service: Gastroenterology;;   UPPER GASTROINTESTINAL ENDOSCOPY       A IV Location/Drains/Wounds Patient Lines/Drains/Airways Status     Active Line/Drains/Airways     Name Placement date Placement time Site Days   Peripheral IV 10/25/22 18 G Left Antecubital 10/25/22  1345  Antecubital  less than 1   Peripheral IV 10/25/22 20 G Anterior;Left Forearm 10/25/22  1625  Forearm  less than 1   Fistula /  Graft Right Upper arm Arteriovenous fistula 08/14/22  1230  Upper arm  72   Hemodialysis Catheter Right Internal jugular Double lumen Permanent (Tunneled) 08/14/22  1144  Internal jugular  72   Wound / Incision (Open or Dehisced) 08/13/22 Other (Comment) Sacrum Left purple raised bruise 08/13/22  1102  Sacrum  73   Wound / Incision (Open or Dehisced) 08/13/22 Other (Comment) Labia Lesions 08/13/22  1104  Labia  73            Intake/Output Last 24 hours  Intake/Output Summary (Last 24 hours) at 10/25/2022 2048 Last data filed at 10/25/2022 2003 Gross per 24 hour  Intake 615 ml  Output --  Net 615 ml    Labs/Imaging Results for orders placed or performed during the hospital encounter of 10/25/22 (from the past 48 hour(s))  I-stat chem 8, ED (not at [facility], [facility] or [facility])     Status: Abnormal   Collection Time: 10/25/22  1:41 PM  Result Value Ref Range   Sodium 128 (L) 135 - 145 mmol/L   Potassium 3.5 3.5 - 5.1 mmol/L   Chloride 105 98 - 111 mmol/L   BUN 79 (H) 8 - 23 mg/dL   Creatinine, Ser 9.56 (H) 0.44 - 1.00 mg/dL   Glucose, Bld  106 (H) 70 - 99 mg/dL    Comment: Glucose reference range applies only to samples taken after fasting for at least 8 hours.   Calcium, Ion 1.05 (L) 1.15 - 1.40 mmol/L   TCO2 10 (L) 22 - 32 mmol/L   Hemoglobin 6.5 (LL) 12.0 - 15.0 g/dL   HCT 16.1 (L) 09.6 - 04.5 %   Comment NOTIFIED PHYSICIAN   I-Stat venous blood gas, ([facility] ED, [facility], [facility])     Status: Abnormal   Collection Time: 10/25/22  1:41 PM  Result Value Ref Range   pH, Ven 7.253 7.25 - 7.43   pCO2, Ven 18.5 (LL) 44 - 60 mmHg   pO2, Ven 38 32 - 45 mmHg   Bicarbonate 8.2 (L) 20.0 - 28.0 mmol/L   TCO2 9 (L) 22 - 32 mmol/L   O2 Saturation 65 %   Acid-base deficit 17.0 (H) 0.0 - 2.0 mmol/L   Sodium 129 (L) 135 - 145 mmol/L   Potassium 3.5 3.5 - 5.1 mmol/L   Calcium, Ion 1.05 (L) 1.15 - 1.40 mmol/L   HCT 19.0 (L) 36.0 - 46.0 %   Hemoglobin 6.5 (LL) 12.0 - 15.0 g/dL   Sample type VENOUS    Comment  NOTIFIED PHYSICIAN   I-Stat CG4 Lactic Acid     Status: None   Collection Time: 10/25/22  1:42 PM  Result Value Ref Range   Lactic Acid, Venous 1.6 0.5 - 1.9 mmol/L  CBC with Differential     Status: Abnormal   Collection Time: 10/25/22  1:51 PM  Result Value Ref Range   WBC 12.5 (H) 4.0 - 10.5 K/uL   RBC 1.93 (L) 3.87 - 5.11 MIL/uL   Hemoglobin 6.5 (LL) 12.0 - 15.0 g/dL    Comment: REPEATED TO VERIFY THIS CRITICAL RESULT HAS VERIFIED AND BEEN CALLED TO CAMERON COBB, RN BY [employer] ON 08 09 2024 AT 1438, AND HAS BEEN READ BACK.     HCT 19.3 (L) 36.0 - 46.0 %   MCV 100.0 80.0 - 100.0 fL   MCH 33.7 26.0 - 34.0 pg   MCHC 33.7 30.0 - 36.0 g/dL   RDW 40.9 (H) 81.1 - 91.4 %   Platelets 278 150 - 400 K/uL   nRBC 0.0 0.0 - 0.2 %   Neutrophils Relative % 92 %   Neutro Abs 11.4 (H) 1.7 - 7.7 K/uL   Lymphocytes Relative 5 %   Lymphs Abs 0.7 0.7 - 4.0 K/uL   Monocytes Relative 3 %   Monocytes Absolute 0.4 0.1 - 1.0 K/uL   Eosinophils Relative 0 %   Eosinophils Absolute 0.0 0.0 - 0.5 K/uL   Basophils Relative 0 %   Basophils Absolute 0.0 0.0 - 0.1 K/uL   WBC Morphology MORPHOLOGY UNREMARKABLE    RBC Morphology MORPHOLOGY UNREMARKABLE    Smear Review MORPHOLOGY UNREMARKABLE    Immature Granulocytes 0 %   Abs Immature Granulocytes 0.04 0.00 - 0.07 K/uL    Comment: Performed at [facility], [address] [place], [place] [address]  Comprehensive metabolic panel     Status: Abnormal   Collection Time: 10/25/22  1:51 PM  Result Value Ref Range   Sodium 128 (L) 135 - 145 mmol/L   Potassium 3.5 3.5 - 5.1 mmol/L   Chloride 102 98 - 111 mmol/L   CO2 9 (L) 22 - 32 mmol/L   Glucose, Bld 107 (H) 70 - 99 mg/dL    Comment: Glucose reference range applies only to  samples taken after fasting for at least 8 hours.   BUN 75 (H) 8 - 23 mg/dL   Creatinine, Ser 1.02 (H) 0.44 - 1.00 mg/dL   Calcium 8.0 (L) 8.9 - 10.3 mg/dL   Total Protein 6.3 (L) 6.5 - 8.1 g/dL   Albumin 2.5 (L) 3.5  - 5.0 g/dL   AST 23 15 - 41 U/L   ALT 15 0 - 44 U/L   Alkaline Phosphatase 76 38 - 126 U/L   Total Bilirubin 0.6 0.3 - 1.2 mg/dL   GFR, Estimated 10 (L) >60 mL/min    Comment: (NOTE) Calculated using the CKD-EPI Creatinine Equation (2021)    Anion gap 17 (H) 5 - 15    Comment: Performed at [facility], [address], [place], [place] [address]  Lipase, blood     Status: None   Collection Time: 10/25/22  1:51 PM  Result Value Ref Range   Lipase 41 11 - 51 U/L    Comment: Performed at [facility], [address], [place], [place] [address]  Magnesium     Status: Abnormal   Collection Time: 10/25/22  1:51 PM  Result Value Ref Range   Magnesium 1.6 (L) 1.7 - 2.4 mg/dL    Comment: Performed at [facility], [address] [place], [place] [address]  I-Stat arterial blood gas, ED [facility] ED, [facility], [facility])     Status: Abnormal   Collection Time: 10/25/22  1:54 PM  Result Value Ref Range   pH, Arterial 7.310 (L) 7.35 - 7.45   pCO2 arterial 15.2 (LL) 32 - 48 mmHg   pO2, Arterial 193 (H) 83 - 108 mmHg   Bicarbonate 7.6 (L) 20.0 - 28.0 mmol/L   TCO2 8 (L) 22 - 32 mmol/L   O2 Saturation 100 %   Acid-base deficit 17.0 (H) 0.0 - 2.0 mmol/L   Sodium 129 (L) 135 - 145 mmol/L   Potassium 3.5 3.5 - 5.1 mmol/L   Calcium, Ion 1.08 (L) 1.15 - 1.40 mmol/L   HCT 21.0 (L) 36.0 - 46.0 %   Hemoglobin 7.1 (L) 12.0 - 15.0 g/dL   Patient temperature 95.6 F    Collection site RADIAL, ALLEN'S TEST ACCEPTABLE    Drawn by RT    Sample type ARTERIAL    Comment NOTIFIED PHYSICIAN   Prepare RBC (crossmatch)     Status: None   Collection Time: 10/25/22  2:54 PM  Result Value Ref Range   Order Confirmation      ORDERS RECEIVED TO CROSSMATCH Performed at [facility], [address], [place], [place] [address]   Blood culture (routine x 2)     Status: None (Preliminary result)   Collection Time: 10/25/22  3:03 PM   Specimen: BLOOD LEFT FOREARM  Result Value Ref Range   Specimen  Description BLOOD LEFT FOREARM    Special Requests      BOTTLES DRAWN AEROBIC AND ANAEROBIC Blood Culture results may not be optimal due to an excessive volume of blood received in culture bottles Performed at [facility], [address], [place], [place] [address]    Culture PENDING    Report Status PENDING   Type and screen [facility]     Status: None (Preliminary result)   Collection Time: 10/25/22  3:13 PM  Result Value Ref Range   ABO/RH(D) A POS    Antibody Screen NEG    Sample Expiration  10/28/2022,2359 Performed at [facility], [address], [place], [place] [address]    Unit Number [id number]    Blood Component Type RED CELLS,LR    Unit division 00    Status of Unit ISSUED    Transfusion Status OK TO TRANSFUSE    Crossmatch Result Compatible    Unit Number [id number]    Blood Component Type RED CELLS,LR    Unit division 00    Status of Unit ALLOCATED    Transfusion Status OK TO TRANSFUSE    Crossmatch Result Compatible    Unit Number [id number]    Blood Component Type RED CELLS,LR    Unit division 00    Status of Unit ALLOCATED    Transfusion Status OK TO TRANSFUSE    Crossmatch Result Compatible   POC occult blood, ED Provider will collect     Status: Abnormal   Collection Time: 10/25/22  3:14 PM  Result Value Ref Range   Fecal Occult Bld POSITIVE (A) NEGATIVE  Urinalysis, w/ Reflex to Culture (Infection Suspected) -Urine, Clean Catch     Status: Abnormal   Collection Time: 10/25/22  8:15 PM  Result Value Ref Range   Specimen Source URINE, CLEAN CATCH    Color, Urine YELLOW YELLOW   APPearance HAZY (A) CLEAR   Specific Gravity, Urine 1.009 1.005 - 1.030   pH 5.0 5.0 - 8.0   Glucose, UA 50 (A) NEGATIVE mg/dL   Hgb urine dipstick LARGE (A) NEGATIVE   Bilirubin Urine NEGATIVE NEGATIVE   Ketones, ur NEGATIVE NEGATIVE mg/dL   Protein, ur 324 (A) NEGATIVE mg/dL   Nitrite POSITIVE (A) NEGATIVE   Leukocytes,Ua  MODERATE (A) NEGATIVE   RBC / HPF 6-10 0 - 5 RBC/hpf   WBC, UA 21-50 0 - 5 WBC/hpf    Comment:        Reflex urine culture not performed if WBC <=10, OR if Squamous epithelial cells >5. If Squamous epithelial cells >5 suggest recollection.    Bacteria, UA MANY (A) NONE SEEN   Squamous Epithelial / HPF 0-5 0 - 5 /HPF   Mucus PRESENT    Amorphous Crystal PRESENT     Comment: Performed at [facility], [address] [place], [place] [address]  I-Stat CG4 Lactic Acid     Status: None   Collection Time: 10/25/22  8:17 PM  Result Value Ref Range   Lactic Acid, Venous 1.0 0.5 - 1.9 mmol/L  Prepare RBC (crossmatch)     Status: None   Collection Time: 10/25/22  8:25 PM  Result Value Ref Range   Order Confirmation      ORDER PROCESSED BY BLOOD BANK Performed at [facility], [address], [place], [place] [address]    CT ABDOMEN PELVIS WO CONTRAST  Result Date: 10/25/2022 CLINICAL DATA:  Shortness of breath and chest pain EXAM: CT ABDOMEN AND PELVIS WITHOUT CONTRAST TECHNIQUE: Multidetector CT imaging of the abdomen and pelvis was performed following the standard protocol without IV contrast. RADIATION DOSE REDUCTION: This exam was performed according to the departmental dose-optimization program which includes automated exposure control, adjustment of the mA and/or kV according to patient size and/or use of iterative reconstruction technique. COMPARISON:  CT abdomen and pelvis dated 12/31/2018 FINDINGS: Lower chest: Left lower lobe consolidation with air bronchograms. No pleural effusion or pneumothorax demonstrated. Partially imaged heart size is normal. Coronary artery calcifications. Hepatobiliary: Subcentimeter segment 3 hypodensities, too small to characterize, but unchanged from 12/31/2018. No intra  or extrahepatic biliary ductal dilation. Normal gallbladder. Pancreas: No focal lesions or main ductal dilation. Spleen: Normal in size without focal abnormality. Adrenals/Urinary  Tract: No adrenal nodules. 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 No suspicious renal mass, calculi or hydronephrosis. Gas within the urinary bladder, which demonstrates mild mural thickening and pericystic stranding. Stomach/Bowel: Normal appearance of the stomach. No evidence of bowel wall thickening, distention, or inflammatory changes. Normal appendix. Vascular/Lymphatic: Aortic atherosclerosis. No enlarged abdominal or pelvic lymph nodes. Reproductive: No adnexal masses. Other: Trace free fluid.  No fluid collection or free air. Musculoskeletal: No acute or abnormal lytic or blastic osseous lesions. Multilevel degenerative changes of the partially imaged thoracic and lumbar spine. IMPRESSION: 1. Left lower lobe consolidation with air bronchograms, suspicious for pneumonia. 2. Gas within the urinary bladder, which demonstrates mild mural thickening and pericystic stranding, suspicious for cystitis. Recommend correlation with urinalysis. 3. Aortic Atherosclerosis (ICD10-I70.0). Coronary artery calcifications. Assessment for potential risk factor modification, dietary therapy or pharmacologic therapy may be warranted, if clinically indicated. Electronically Signed   By: Agustin Cree M.D.   On: 10/25/2022 16:22   DG Chest Portable 1 View  Result Date: 10/25/2022 CLINICAL DATA:  Shortness of breath EXAM: PORTABLE CHEST 1 VIEW COMPARISON:  Previous studies including the examination of 08/19/2022 FINDINGS: Transverse diameter of heart is slightly increased. Tip of right IJ dialysis catheter is seen in the region of superior vena cava. There is interval clearing of pulmonary vascular congestion and pulmonary edema. There is focal patchy infiltrate in right mid and right lower lung fields suggesting pneumonia. Lateral CP angles are clear. There is no pneumothorax. IMPRESSION: There are patchy alveolar infiltrates in right mid and right lower lung fields suggesting pneumonia. There are no signs of alveolar  pulmonary edema. There is no pleural effusion or pneumothorax. Electronically Signed   By: [name] Avena M.D.   On: 10/25/2022 15:03    Pending Labs Unresulted Labs (From admission, onward)     Start     Ordered   10/26/22 0500  Comprehensive metabolic panel  Daily,   R      10/25/22 2021   10/26/22 0500  CBC  Daily,   R      10/25/22 2021   10/26/22 0200  Hemoglobin and hematocrit, blood  3 times daily,   R (with TIMED occurrences)      10/25/22 2025   10/26/22 0001  Blood gas, arterial  Once,   R        10/25/22 2027   10/25/22 2330  CBC with Differential  Once-Timed,   STAT        10/25/22 1941   10/25/22 2023  Folate  (Anemia Panel (PNL))  Add-on,   AD        10/25/22 2022   10/25/22 2023  Iron and TIBC  (Anemia Panel (PNL))  Add-on,   AD        10/25/22 2022   10/25/22 2023  Ferritin  (Anemia Panel (PNL))  Add-on,   AD        10/25/22 2022   10/25/22 2023  Reticulocytes  (Anemia Panel (PNL))  Add-on,   AD        10/25/22 2022   10/25/22 2023  Hepatitis B surface antigen  Add-on,   AD        10/25/22 2022   10/25/22 2023  Legionella Pneumophila Serogp 1 Ur Ag  Add-on,   AD        10/25/22 2022  10/25/22 2023  Strep pneumoniae urinary antigen  Add-on,   AD        10/25/22 2022   10/25/22 2023  Hepatitis B surface antibody,quantitative  Add-on,   AD        10/25/22 2022   10/25/22 2023  Hepatitis B core antibody, IgM  Add-on,   AD        10/25/22 2022   10/25/22 2023  Hepatitis C antibody  Add-on,   AD        10/25/22 2023   10/25/22 2022  Expectorated Sputum Assessment w Gram Stain, Rflx to Resp Cult  Once,   R       Question Answer Comment  Patient immune status Immunocompromised   Release to patient Immediate      10/25/22 2021   10/25/22 2022  Respiratory (~20 pathogens) panel by PCR  (Respiratory panel by PCR (~20 pathogens, ~24 hr TAT)  w precautions)  Once,   R       Question Answer Comment  Patient immune status Immunocompromised   Release to patient  Immediate      10/25/22 2021   10/25/22 2022  Renal function panel  Once,   R       Question:  Specimen collection method  Answer:  Lab=Lab collect   10/25/22 2021   10/25/22 2022  Vitamin B12  (Anemia Panel (PNL))  Add-on,   AD        10/25/22 2022   10/25/22 2015  Urine Culture  Once,   R        10/25/22 2015   10/25/22 1704  Hepatitis B surface antigen  (New Admission Hemo Labs (Hepatitis B))  ONCE - URGENT,   URGENT        10/25/22 1703   10/25/22 1703  Hepatitis B surface antibody,quantitative  (New Admission Hemo Labs (Hepatitis B))  ONCE - URGENT,   URGENT        10/25/22 1703   10/25/22 1351  Blood culture (routine x 2)  BLOOD CULTURE X 2,   R (with STAT occurrences)      10/25/22 1352            Vitals/Pain Today's Vitals   10/25/22 1925 10/25/22 1939 10/25/22 1941 10/25/22 2000  BP:  139/70  (!) 158/67  Pulse:  98  (!) 101  Resp:  (!) 23  (!) 30  Temp:  100.1 F (37.8 C)    TempSrc:  Oral    SpO2:    100%  Weight:   44.5 kg   Height:   5\' 7"  (1.702 m)   PainSc: 3        Isolation Precautions Droplet precaution  Medications Medications  Chlorhexidine Gluconate Cloth 2 % PADS 6 each (has no administration in time range)  heparin injection 2,500 Units (has no administration in time range)  sodium bicarbonate tablet 1,300 mg (has no administration in time range)  ferrous sulfate tablet 325 mg (has no administration in time range)  sodium chloride flush (NS) 0.9 % injection 3 mL (has no administration in time range)  sodium chloride flush (NS) 0.9 % injection 3 mL (has no administration in time range)  0.9 %  sodium chloride infusion (has no administration in time range)  acetaminophen (TYLENOL) tablet 650 mg (has no administration in time range)    Or  acetaminophen (TYLENOL) suppository 650 mg (has no administration in time range)  ondansetron (ZOFRAN) tablet 4 mg (has no administration in time range)  Or  ondansetron (ZOFRAN) injection 4 mg (has no  administration in time range)  docusate sodium (COLACE) capsule 100 mg (has no administration in time range)  hydrALAZINE (APRESOLINE) injection 10 mg (has no administration in time range)  pantoprazole (PROTONIX) 80 mg /NS 100 mL IVPB (has no administration in time range)  pentafluoroprop-tetrafluoroeth (GEBAUERS) aerosol 1 Application (has no administration in time range)  lidocaine (PF) (XYLOCAINE) 1 % injection 5 mL (has no administration in time range)  lidocaine-prilocaine (EMLA) cream 1 Application (has no administration in time range)  heparin injection 1,000 Units (has no administration in time range)  anticoagulant sodium citrate solution 5 mL (has no administration in time range)  alteplase (CATHFLO ACTIVASE) injection 2 mg (has no administration in time range)  heparin injection 1,200 Units (has no administration in time range)  0.9 %  sodium chloride infusion (Manually program via Guardrails IV Fluids) (has no administration in time range)  gabapentin (NEURONTIN) capsule 100 mg (has no administration in time range)  oxyCODONE-acetaminophen (PERCOCET/ROXICET) 5-325 MG per tablet 1 tablet (has no administration in time range)  ipratropium-albuterol (DUONEB) 0.5-2.5 (3) MG/3ML nebulizer solution 3 mL (has no administration in time range)  methylPREDNISolone sodium succinate (SOLU-MEDROL) 125 mg/2 mL injection 80 mg (has no administration in time range)  levalbuterol (XOPENEX) nebulizer solution 0.63 mg (has no administration in time range)  guaiFENesin (MUCINEX) 12 hr tablet 600 mg (has no administration in time range)  benzonatate (TESSALON) capsule 100 mg (has no administration in time range)  metoprolol tartrate (LOPRESSOR) injection 5 mg (has no administration in time range)  sodium bicarbonate 150 mEq in dextrose 5 % 1,150 mL infusion (has no administration in time range)  0.9 %  sodium chloride infusion (Manually program via Guardrails IV Fluids) (0 mLs Intravenous Stopped 10/25/22  1940)  pantoprazole (PROTONIX) 80 mg /NS 100 mL IVPB (0 mg Intravenous Stopped 10/25/22 1652)  sodium bicarbonate injection 50 mEq (50 mEq Intravenous Given 10/25/22 1618)  HYDROmorphone (DILAUDID) injection 0.5 mg (0.5 mg Intravenous Given 10/25/22 1615)  vancomycin (VANCOCIN) IVPB 1000 mg/200 mL premix (0 mg Intravenous Stopped 10/25/22 2003)  ceFEPIme (MAXIPIME) 1 g in sodium chloride 0.9 % 100 mL IVPB (0 g Intravenous Stopped 10/25/22 1925)    Mobility walks     Focused Assessments     R Recommendations: See Admitting Provider Note  Report given to:   Additional Notes:

## 2022-10-26 DIAGNOSIS — J189 Pneumonia, unspecified organism: Secondary | ICD-10-CM

## 2022-10-26 DIAGNOSIS — R6510 Systemic inflammatory response syndrome (SIRS) of non-infectious origin without acute organ dysfunction: Secondary | ICD-10-CM

## 2022-10-26 DIAGNOSIS — R195 Other fecal abnormalities: Secondary | ICD-10-CM | POA: Diagnosis not present

## 2022-10-26 DIAGNOSIS — Z5900 Homelessness unspecified: Secondary | ICD-10-CM

## 2022-10-26 DIAGNOSIS — N186 End stage renal disease: Secondary | ICD-10-CM

## 2022-10-26 DIAGNOSIS — J9601 Acute respiratory failure with hypoxia: Secondary | ICD-10-CM | POA: Diagnosis not present

## 2022-10-26 DIAGNOSIS — J15212 Pneumonia due to Methicillin resistant Staphylococcus aureus: Secondary | ICD-10-CM

## 2022-10-26 DIAGNOSIS — D649 Anemia, unspecified: Secondary | ICD-10-CM | POA: Diagnosis not present

## 2022-10-26 DIAGNOSIS — Z992 Dependence on renal dialysis: Secondary | ICD-10-CM

## 2022-10-26 LAB — CBC
HCT: 21.2 % — ABNORMAL LOW (ref 36.0–46.0)
Hemoglobin: 7.4 g/dL — ABNORMAL LOW (ref 12.0–15.0)
MCH: 32 pg (ref 26.0–34.0)
MCHC: 34.9 g/dL (ref 30.0–36.0)
MCV: 91.8 fL (ref 80.0–100.0)
Platelets: 189 10*3/uL (ref 150–400)
RBC: 2.31 MIL/uL — ABNORMAL LOW (ref 3.87–5.11)
RDW: 17.2 % — ABNORMAL HIGH (ref 11.5–15.5)
WBC: 5.6 10*3/uL (ref 4.0–10.5)
nRBC: 0.4 % — ABNORMAL HIGH (ref 0.0–0.2)

## 2022-10-26 LAB — BASIC METABOLIC PANEL
Anion gap: 14 (ref 5–15)
BUN: 25 mg/dL — ABNORMAL HIGH (ref 8–23)
CO2: 22 mmol/L (ref 22–32)
Calcium: 7.6 mg/dL — ABNORMAL LOW (ref 8.9–10.3)
Chloride: 94 mmol/L — ABNORMAL LOW (ref 98–111)
Creatinine, Ser: 2.24 mg/dL — ABNORMAL HIGH (ref 0.44–1.00)
GFR, Estimated: 24 mL/min — ABNORMAL LOW (ref >60)
Glucose, Bld: 223 mg/dL — ABNORMAL HIGH (ref 70–99)
Potassium: 2.8 mmol/L — ABNORMAL LOW (ref 3.5–5.1)
Sodium: 130 mmol/L — ABNORMAL LOW (ref 135–145)

## 2022-10-26 LAB — PREPARE RBC (CROSSMATCH)

## 2022-10-26 LAB — GLUCOSE, CAPILLARY
Glucose-Capillary: 72 mg/dL (ref 70–99)
Glucose-Capillary: 148 mg/dL — ABNORMAL HIGH (ref 70–99)
Glucose-Capillary: 195 mg/dL — ABNORMAL HIGH (ref 70–99)
Glucose-Capillary: 190 mg/dL — ABNORMAL HIGH (ref 70–99)
Glucose-Capillary: 114 mg/dL — ABNORMAL HIGH (ref 70–99)

## 2022-10-26 LAB — MRSA NEXT GEN BY PCR, NASAL: MRSA by PCR Next Gen: DETECTED — AB

## 2022-10-26 LAB — MAGNESIUM: Magnesium: 1.8 mg/dL (ref 1.7–2.4)

## 2022-10-26 MED ORDER — DARBEPOETIN ALFA 100 MCG/0.5ML IJ SOSY
100.0000 ug | PREFILLED_SYRINGE | INTRAMUSCULAR | Status: DC
  Administered 2022-10-28: 100 ug via SUBCUTANEOUS
  Filled 2022-10-26: qty 0.5

## 2022-10-26 MED ORDER — SODIUM CHLORIDE 0.9% IV SOLUTION: Freq: Once | INTRAVENOUS | Status: DC

## 2022-10-26 MED ORDER — MUPIROCIN 2 % EX OINT
1.0000 | TOPICAL_OINTMENT | Freq: Two times a day (BID) | CUTANEOUS | Status: AC
  Administered 2022-10-26 – 2022-10-30 (×8): 1 via NASAL
  Filled 2022-10-26 (×2): qty 22

## 2022-10-26 MED ORDER — POTASSIUM CHLORIDE CRYS ER 20 MEQ PO TBCR
40.0000 meq | EXTENDED_RELEASE_TABLET | Freq: Once | ORAL | Status: AC
  Administered 2022-10-26: 40 meq via ORAL
  Filled 2022-10-26: qty 2

## 2022-10-26 MED ORDER — VANCOMYCIN HCL 500 MG/100ML IV SOLN
500.0000 mg | Freq: Once | INTRAVENOUS | Status: AC
  Administered 2022-10-26: 500 mg via INTRAVENOUS
  Filled 2022-10-26: qty 100

## 2022-10-26 MED ORDER — CHLORHEXIDINE GLUCONATE CLOTH 2 % EX PADS
6.0000 | MEDICATED_PAD | Freq: Every day | CUTANEOUS | Status: AC
  Administered 2022-10-26 – 2022-10-30 (×5): 6 via TOPICAL

## 2022-10-26 NOTE — Progress Notes (Signed)
PHARMACY - PHYSICIAN COMMUNICATION CRITICAL VALUE ALERT - BLOOD CULTURE IDENTIFICATION (BCID)  [name] is an 62 y.o. female who presented to [facility] on 10/25/2022 with a chief complaint of shortness of breath and chest pain.   Assessment: Lab called and reported 1/4 S. epidermidis (Methicillin resistance mecA/C) blood cultures. This is likely a contaminant.  Name of physician (or Provider) Contacted: Dr. Arlean Hopping  Current antibiotics: Vancomycin and cefepime  Changes to prescribed antibiotics recommended:  No changes or additional antibiotics needed.   Results for orders placed or performed during the hospital encounter of 10/25/22  Blood Culture ID Panel (Reflexed) (Collected: 10/25/2022  1:51 PM)  Result Value Ref Range   Enterococcus faecalis NOT DETECTED NOT DETECTED   Enterococcus Faecium NOT DETECTED NOT DETECTED   Listeria monocytogenes NOT DETECTED NOT DETECTED   Staphylococcus species DETECTED (A) NOT DETECTED   Staphylococcus aureus (BCID) NOT DETECTED NOT DETECTED   Staphylococcus epidermidis DETECTED (A) NOT DETECTED   Staphylococcus lugdunensis NOT DETECTED NOT DETECTED   Streptococcus species NOT DETECTED NOT DETECTED   Streptococcus agalactiae NOT DETECTED NOT DETECTED   Streptococcus pneumoniae NOT DETECTED NOT DETECTED   Streptococcus pyogenes NOT DETECTED NOT DETECTED   A.calcoaceticus-baumannii NOT DETECTED NOT DETECTED   Bacteroides fragilis NOT DETECTED NOT DETECTED   Enterobacterales NOT DETECTED NOT DETECTED   Enterobacter cloacae complex NOT DETECTED NOT DETECTED   Escherichia coli NOT DETECTED NOT DETECTED   Klebsiella aerogenes NOT DETECTED NOT DETECTED   Klebsiella oxytoca NOT DETECTED NOT DETECTED   Klebsiella pneumoniae NOT DETECTED NOT DETECTED   Proteus species NOT DETECTED NOT DETECTED   Salmonella species NOT DETECTED NOT DETECTED   Serratia marcescens NOT DETECTED NOT DETECTED   Haemophilus influenzae NOT DETECTED NOT DETECTED    Neisseria meningitidis NOT DETECTED NOT DETECTED   Pseudomonas aeruginosa NOT DETECTED NOT DETECTED   Stenotrophomonas maltophilia NOT DETECTED NOT DETECTED   Candida albicans NOT DETECTED NOT DETECTED   Candida auris NOT DETECTED NOT DETECTED   Candida glabrata NOT DETECTED NOT DETECTED   Candida krusei NOT DETECTED NOT DETECTED   Candida parapsilosis NOT DETECTED NOT DETECTED   Candida tropicalis NOT DETECTED NOT DETECTED   Cryptococcus neoformans/gattii NOT DETECTED NOT DETECTED   Methicillin resistance mecA/C DETECTED (A) NOT DETECTED   Roslyn Smiling, PharmD PGY1 Pharmacy Resident 10/26/2022 7:32 PM

## 2022-10-26 NOTE — Progress Notes (Signed)
[facility] Progress Note   Subjective:   Pt seen in room, on O2. Had HD overnight and reports it went well. Denies SOB but reports cough. Denies CP, dizziness. + weakness. Hgb down to 5.5, GI following.   Objective Vitals:   10/26/22 0500 10/26/22 0640 10/26/22 0720 10/26/22 0734  BP:    103/82  Pulse: (!) 101  88 92  Resp: 20  18 18   Temp:    98 F (36.7 C)  TempSrc:    Oral  SpO2: 94% 100% 100% 99%  Weight: 44.7 kg     Height:       Physical Exam General: Alert female, chronically ill appearing, NAD Heart: RRR, no murmurs, rubs or gallops Lungs: Diminished in bases, otherwise CTA bilaterally Abdomen: Soft, non-distended, +BS Extremities: No edema b/l lower extremities Dialysis Access:  TDC, RUE AVF +bruit  Additional Objective Labs: Basic Metabolic Panel: Recent Labs  Lab 10/25/22 1341 10/25/22 1351 10/25/22 1354 10/25/22 2053  NA 128*  129* 128* 129* 130*  K 3.5  3.5 3.5 3.5 3.4*  CL 105 102  --  102  CO2  --  9*  --  11*  GLUCOSE 106* 107*  --  99  BUN 79* 75*  --  74*  CREATININE 5.30* 4.80*  --  4.69*  CALCIUM  --  8.0*  --  7.8*  PHOS  --   --   --  5.8*   Liver Function Tests: Recent Labs  Lab 10/25/22 1351 10/25/22 2053  AST 23  --   ALT 15  --   ALKPHOS 76  --   BILITOT 0.6  --   PROT 6.3*  --   ALBUMIN 2.5* 2.4*   Recent Labs  Lab 10/25/22 1351  LIPASE 41   CBC: Recent Labs  Lab 10/25/22 1351 10/25/22 1354 10/25/22 2053 10/26/22 0622  WBC 12.5*  --  9.1 4.4  NEUTROABS 11.4*  --  8.2*  --   HGB 6.5* 7.1* 8.1* 5.5*  HCT 19.3* 21.0* 24.3* 15.2*  MCV 100.0  --  94.9 90.5  PLT 278  --  248 153   Blood Culture    Component Value Date/Time   SDES URINE, CLEAN CATCH 10/25/2022 2015   SPECREQUEST  10/25/2022 2015    NONE Reflexed from [id number] Performed at [facility], [address] [place], [place] [address]    CULT PENDING 10/25/2022 2015   REPTSTATUS PENDING 10/25/2022 2015    Cardiac Enzymes: No  results for input(s): "CKTOTAL", "CKMB", "CKMBINDEX", "TROPONINI" in the last 168 hours. CBG: Recent Labs  Lab 10/26/22 0329 10/26/22 0607  GLUCAP 72 148*   Iron Studies:  Recent Labs    10/25/22 2053  IRON 7*  TIBC 200*  FERRITIN 187   @lablastinr3 @ Studies/Results: CT ABDOMEN PELVIS WO CONTRAST  Result Date: 10/25/2022 CLINICAL DATA:  Shortness of breath and chest pain EXAM: CT ABDOMEN AND PELVIS WITHOUT CONTRAST TECHNIQUE: Multidetector CT imaging of the abdomen and pelvis was performed following the standard protocol without IV contrast. RADIATION DOSE REDUCTION: This exam was performed according to the departmental dose-optimization program which includes automated exposure control, adjustment of the mA and/or kV according to patient size and/or use of iterative reconstruction technique. COMPARISON:  CT abdomen and pelvis dated 12/31/2018 FINDINGS: Lower chest: Left lower lobe consolidation with air bronchograms. No pleural effusion or pneumothorax demonstrated. Partially imaged heart size is normal. Coronary artery calcifications. Hepatobiliary: Subcentimeter segment 3 hypodensities, too small to characterize,  but unchanged from 12/31/2018. No intra or extrahepatic biliary ductal dilation. Normal gallbladder. Pancreas: No focal lesions or main ductal dilation. Spleen: Normal in size without focal abnormality. Adrenals/Urinary Tract: No adrenal nodules. 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 No suspicious renal mass, calculi or hydronephrosis. Gas within the urinary bladder, which demonstrates mild mural thickening and pericystic stranding. Stomach/Bowel: Normal appearance of the stomach. No evidence of bowel wall thickening, distention, or inflammatory changes. Normal appendix. Vascular/Lymphatic: Aortic atherosclerosis. No enlarged abdominal or pelvic lymph nodes. Reproductive: No adnexal masses. Other: Trace free fluid.  No fluid collection or free air. Musculoskeletal: No acute  or abnormal lytic or blastic osseous lesions. Multilevel degenerative changes of the partially imaged thoracic and lumbar spine. IMPRESSION: 1. Left lower lobe consolidation with air bronchograms, suspicious for pneumonia. 2. Gas within the urinary bladder, which demonstrates mild mural thickening and pericystic stranding, suspicious for cystitis. Recommend correlation with urinalysis. 3. Aortic Atherosclerosis (ICD10-I70.0). Coronary artery calcifications. Assessment for potential risk factor modification, dietary therapy or pharmacologic therapy may be warranted, if clinically indicated. Electronically Signed   By: Agustin Cree M.D.   On: 10/25/2022 16:22   DG Chest Portable 1 View  Result Date: 10/25/2022 CLINICAL DATA:  Shortness of breath EXAM: PORTABLE CHEST 1 VIEW COMPARISON:  Previous studies including the examination of 08/19/2022 FINDINGS: Transverse diameter of heart is slightly increased. Tip of right IJ dialysis catheter is seen in the region of superior vena cava. There is interval clearing of pulmonary vascular congestion and pulmonary edema. There is focal patchy infiltrate in right mid and right lower lung fields suggesting pneumonia. Lateral CP angles are clear. There is no pneumothorax. IMPRESSION: There are patchy alveolar infiltrates in right mid and right lower lung fields suggesting pneumonia. There are no signs of alveolar pulmonary edema. There is no pleural effusion or pneumothorax. Electronically Signed   By: [name] Avena M.D.   On: 10/25/2022 15:03   Medications:  sodium chloride     ceFEPime (MAXIPIME) IV     pantoprazole (PROTONIX) IV 80 mg (10/26/22 0447)    sodium chloride   Intravenous Once   sodium chloride   Intravenous Once   atorvastatin  10 mg Oral Daily   Chlorhexidine Gluconate Cloth  6 each Topical Q0600   Chlorhexidine Gluconate Cloth  6 each Topical Q0600   docusate sodium  100 mg Oral BID   gabapentin  100 mg Oral TID   guaiFENesin  600 mg Oral BID    ipratropium-albuterol  3 mL Nebulization Q6H   methylPREDNISolone (SOLU-MEDROL) injection  80 mg Intravenous Daily   mupirocin ointment  1 Application Nasal BID   sodium bicarbonate  1,300 mg Oral TID   sodium chloride flush  3 mL Intravenous Q12H   vancomycin variable dose per unstable renal function (pharmacist dosing)   Does not apply See admin instructions    Outpatient Dialysis Orders: Previously at [facility], unable to obtain records- may have been discharged from clinic  Assessment/Plan: Missed dialysis / ESRD- She reports she missed 1 month of dialysis but was feeling well until this week. Presented with SOB, slightly improved s/p HD overnight. Cr 4's, BUN 74 pre-HD, however she has severe metabolic acidosis and is not compliant with meds so she would benefit from ongoing HD. Will plan for next HD Monday, schedule TBD, will need to start CLIP process again. Pt may have been discharged from clinic if she has not attended in a month.  Metabolic acidosis: bicarb 7 -> 11.  On bicarb drip. Not compliant with PO bicarb or HD outpatient. Recheck RFP, likely improved post HD.  HTN/ volume - BP initially elevated, improved post HD. Still requiring O2 but down to 3L, anemia may be contributing to her SOB as well.  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 GI onboard. Has not had ESA recently, will restart aranesp and check iron levels.  MBD ckd - CCa in range, phos 5.8- if remains elevated will need to start a binder Dialysis access- Has [facility], looks like she missed calls from vascular surgery to schedule second stage surgery, will need to follow up outpatient.   Rogers Blocker, PA-C 10/26/2022, 10:42 AM  [facility] Pager: [phone]
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
## 2022-10-26 NOTE — Progress Notes (Signed)
Received patient in bed to unit.  Alert and oriented.  Informed consent signed and in chart.   TX duration: 3 hours.  Patient tolerated well.  Transported back to the room  Alert, without acute distress.  Hand-off given to patient's nurse.   Access used: catheter Access issues: none  Total UF removed: 2000 ms Medication(s) given: none Post HD VS: 105/63 Post HD weight: 42.5 kg     10/26/22 0208  Vitals  Temp 98.4 F (36.9 C)  Temp Source Oral  BP (!) 131/56  MAP (mmHg) 79  BP Location Left Arm  BP Method Automatic  Patient Position (if appropriate) Lying  Pulse Rate (!) 106  Pulse Rate Source Monitor  ECG Heart Rate (!) 101  Oxygen Therapy  SpO2 98 %  O2 Device Nasal Cannula  O2 Flow Rate (L/min) 4 L/min  Patient Activity (if Appropriate) In bed  Pulse Oximetry Type Continuous  During Treatment Monitoring  Blood Flow Rate (mL/min) 0 mL/min  Arterial Pressure (mmHg) -41.81 mmHg  Venous Pressure (mmHg) 81.61 mmHg  TMP (mmHg) 15.96 mmHg  Ultrafiltration Rate (mL/min) 757 mL/min  Dialysate Flow Rate (mL/min) 300 ml/min  Post Treatment  Dialyzer Clearance Lightly streaked  Duration of HD Treatment -hour(s) 3 hour(s)  Hemodialysis Intake (mL) 0 mL  Liters Processed 54  Fluid Removed (mL) 2000.1 mL  Tolerated HD Treatment Yes  Post-Hemodialysis Comments HD tx completed as expected, toleratedwell, no complaints, pt is stable.  AVG/AVF Arterial Site Held (minutes) 0 minutes  AVG/AVF Venous Site Held (minutes) 0 minutes  Note  Patient Observations pt is in bed resting  Hemodialysis Catheter Right Internal jugular Double lumen Permanent (Tunneled)  Placement Date/Time: 08/14/22 1144   Placed prior to admission: No  Serial / Lot #: [id number]  Expiration Date: 01/22/27  Time Out: Correct patient;Correct site;Correct procedure  Maximum sterile barrier precautions: Hand hygiene;Cap;Mask;Sterile gow...  Site Condition No complications  Blue Lumen Status Infusing;Heparin  locked;Dead end cap in place  Red Lumen Status Flushed;Heparin locked;Dead end cap in place  Purple Lumen Status N/A  Catheter fill solution Heparin 1000 units/ml  Catheter fill volume (Arterial) 1.6 cc  Catheter fill volume (Venous) 1.6  Dressing Type Transparent  Dressing Status Antimicrobial disc in place  Interventions Dressing changed  Drainage Description None  Dressing Change Due 11/02/22  Post treatment catheter status Capped and Clamped

## 2022-10-26 NOTE — Progress Notes (Signed)
[facility] night cross cover note:   I was notified by RN that the patient's hemoglobin this morning is 5.5.  Her vital signs appear stable with systolic blood pressures in the 1 teens and heart rates in the low 100s.  Per brief chart review, it appears that she has already received transfusion of 1 unit PRBC earlier tonight.   I subsequently ordered transfusion of 1 unit PRBC over 3 hours, posttransfusion H&H ordered.     Newton Pigg, DO Hospitalist

## 2022-10-26 NOTE — Progress Notes (Addendum)
Progress Note    [name]  ZOX:[id number] DOB: Feb 14, 1961  DOA: 10/25/2022 PCP: [name], MD      Brief Narrative:    Medical records reviewed and are as summarized below:  [name] is a 62 y.o. 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  Patient being started on hemodialysis in May however due to homeless status patient is unable to maintain dialysis and she could not get hemodialysis  for last few weeks.    ED Course:  At presentation to ED patient found to be have low-grade fever 100.7 F, respiratory rate 25, tachycardic to 112, elevated blood pressure 169/89 and developed acute hypoxic respiratory failure O2 sat dropped to 82% currently maintaining 100% on HFNC 10 L/min. VBG showed 7 point 05/05/36/8.2 and elevated anion gap deficient 27. Normal lactic acid 1.6. CBC showed leukocytosis 12.5, low hemoglobin 6.5, low hematocrit 19.  3, RBC 1.93 and normal platelet 278. CMP showed low sodium 128 (baseline 128 to 133), potassium 3.5, chloride 102, bicarb 9, glucose 107, BUN 75, creatinine 4.8, elevated BUN 75, calcium 8, total protein 6.3, albumin 2.5, GFR 10 and gap 17. Lipase 41 within normal range. Low mag 1.6. CT abdomen pelvis showed 1.left lower lobe consolidation with air bronchogram suspicion for pneumonia.  Gas within the urinary bladder, which demonstrates mild mural thickening and pericystic stranding, suspicious for cystitis    Assessment/Plan:   Principal Problem:   Right lower lobe pneumonia Active Problems:   Metabolic acidosis   Chronic hyponatremia   Acute on chronic blood loss anemia   Acute  hypoxic respiratory failure (HCC)   ESRD on dialysis-noncompliant with outpatient dialysis due to homeless [facility])   History of arteriovenous malformation (AVM)   COPD with acute exacerbation (HCC)   SIRS (systemic inflammatory response syndrome) (HCC)   Essential hypertension   Hepatitis C   Chronic disease anemia   GERD (gastroesophageal reflux disease)   Chronic diastolic CHF (congestive heart failure) (HCC)   Premature atrial complex due to COPD exacerbation and acute hypoxic respiratory failure   Chronic pain syndrome    Body mass index is 15.43 kg/m.    Sepsis secondary to community-acquired pneumonia, bilateral pneumonia: Continue empiric IV antibiotics.  Follow-up blood cultures.   COPD exacerbation: Continue steroids and bronchodilators.   Acute hypoxic respiratory failure: Slowly improving.  She has been weaned down from 10 L/min to 3 L/min oxygen via [place].   ESRD: He had hemodialysis in the early hours of 10/26/2022.  Follow-up with nephrologist for hemodialysis.   Severe anemia: S/p transfusion with 1 unit of PRBCs on 10/25/2022.  Hemoglobin went down to 5.5 from 8.1.  This was likely a spurious result.  Repeat hemoglobin was 7.4.  Transfuse another unit of PRBCs.  Monitor H&H.    Suspected GI bleed, history of recurrent GI bleed, history of AVM: Continue IV Protonix.  Follow-up with gastroenterologist.   Metabolic acidosis: Resolved.  Completed IV sodium bicarbonate infusion.  Monitor BMP.   Hypokalemia: Potassium is down from 3.4-2.8.  Replete potassium and monitor levels.   Chronic hyponatremia: Asymptomatic.   Homelessness: Follow-up with social worker to assist with disposition.  Diet Order  Diet renal with fluid restriction Fluid restriction: 1500 mL Fluid; Room service appropriate? Yes; Fluid consistency: Thin  Diet effective now                             Consultants: Nephrologist Gastroenterologist  Procedures: None    Medications:    sodium chloride   Intravenous Once   sodium chloride   Intravenous Once   atorvastatin  10 mg Oral Daily   Chlorhexidine Gluconate Cloth  6 each Topical Q0600   Chlorhexidine Gluconate Cloth  6 each Topical Q0600   [START ON 10/28/2022] darbepoetin (ARANESP) injection - DIALYSIS  100 mcg Subcutaneous Q Mon-1800   docusate sodium  100 mg Oral BID   gabapentin  100 mg Oral TID   guaiFENesin  600 mg Oral BID   ipratropium-albuterol  3 mL Nebulization Q6H   methylPREDNISolone (SOLU-MEDROL) injection  80 mg Intravenous Daily   mupirocin ointment  1 Application Nasal BID   potassium chloride  40 mEq Oral Once   sodium bicarbonate  1,300 mg Oral TID   sodium chloride flush  3 mL Intravenous Q12H   vancomycin variable dose per unstable renal function (pharmacist dosing)   Does not apply See admin instructions   Continuous Infusions:  sodium chloride     ceFEPime (MAXIPIME) IV     pantoprazole (PROTONIX) IV 80 mg (10/26/22 0447)     Anti-infectives (From admission, onward)    Start     Dose/Rate Route Frequency Ordered Stop   10/26/22 1800  ceFEPIme (MAXIPIME) 1 g in sodium chloride 0.9 % 100 mL IVPB        1 g 200 mL/hr over 30 Minutes Intravenous Every 24 hours 10/25/22 2111     10/25/22 2115  vancomycin variable dose per unstable renal function (pharmacist dosing)         Does not apply See admin instructions 10/25/22 2115     10/25/22 1815  ceFEPIme (MAXIPIME) 1 g in sodium chloride 0.9 % 100 mL IVPB        1 g 200 mL/hr over 30 Minutes Intravenous  Once 10/25/22 1806 10/25/22 1925   10/25/22 1800  vancomycin (VANCOCIN) IVPB 1000 mg/200 mL premix        1,000 mg 200 mL/hr over 60 Minutes Intravenous  Once 10/25/22 1757 10/25/22 2003              Family Communication/Anticipated D/C date and plan/Code Status   DVT prophylaxis: SCDs Start: 10/25/22 2022      Code Status: Full Code  Family Communication: None Disposition Plan: Plan to discharge home in 2 to 3 days   Status is: Inpatient Remains inpatient appropriate because: Pneumonia on IV antibiotics, anemia       Subjective:   Several events noted.  Breathing is a little better today.  She complains of cough and chest tightness.  No vomiting, abdominal pain, rectal bleeding or melena  Objective:    Vitals:   10/26/22 0640 10/26/22 0720 10/26/22 0734 10/26/22 1217  BP:   103/82 99/62  Pulse:  88 92 90  Resp:  18 18   Temp:   98 F (36.7 C) 98.2 F (36.8 C)  TempSrc:   Oral Oral  SpO2: 100% 100% 99% 100%  Weight:      Height:       No data found.   Intake/Output Summary (Last 24 hours) at 10/26/2022 1300 Last data filed at 10/26/2022 0800  Gross per 24 hour  Intake 1356.86 ml  Output 13072.03 ml  Net -11715.17 ml   Filed Weights   10/25/22 2151 10/26/22 0221 10/26/22 0500  Weight: 46.2 kg 42.5 kg 44.7 kg    Exam:  GEN: NAD SKIN: Warm and dry EYES: No pallor or icterus ENT: MMM CV: RRR PULM: B/l rales and wheezing ABD: soft, ND, NT, +BS CNS: AAO x 3, non focal EXT: No edema or tenderness        Data Reviewed:   I have personally reviewed following labs and imaging studies:  Labs: Labs show the following:   Basic Metabolic Panel: Recent Labs  Lab 10/25/22 1341 10/25/22 1351 10/25/22 1354 10/25/22 2053 10/26/22 0806  NA 128*  129* 128* 129* 130* 130*  K 3.5  3.5 3.5 3.5 3.4* 2.8*  CL 105 102  --  102 94*  CO2  --  9*  --  11* 22  GLUCOSE 106* 107*  --  99 223*  BUN 79* 75*  --  74* 25*  CREATININE 5.30* 4.80*  --  4.69* 2.24*  CALCIUM  --  8.0*  --  7.8* 7.6*  MG  --  1.6*  --   --   --   PHOS  --   --   --  5.8*  --    GFR Estimated Creatinine Clearance: 18.4 mL/min (A) (by C-G formula based on SCr of 2.24 mg/dL (H)). Liver Function Tests: Recent Labs  Lab 10/25/22 1351 10/25/22 2053  AST 23  --   ALT 15  --   ALKPHOS 76   --   BILITOT 0.6  --   PROT 6.3*  --   ALBUMIN 2.5* 2.4*   Recent Labs  Lab 10/25/22 1351  LIPASE 41   No results for input(s): "AMMONIA" in the last 168 hours. Coagulation profile No results for input(s): "INR", "PROTIME" in the last 168 hours.  CBC: Recent Labs  Lab 10/25/22 1351 10/25/22 1354 10/25/22 2053 10/26/22 0622 10/26/22 0806  WBC 12.5*  --  9.1 4.4 5.6  NEUTROABS 11.4*  --  8.2*  --   --   HGB 6.5* 7.1* 8.1* 5.5* 7.4*  HCT 19.3* 21.0* 24.3* 15.2* 21.2*  MCV 100.0  --  94.9 90.5 91.8  PLT 278  --  248 153 189   Cardiac Enzymes: No results for input(s): "CKTOTAL", "CKMB", "CKMBINDEX", "TROPONINI" in the last 168 hours. BNP (last 3 results) No results for input(s): "PROBNP" in the last 8760 hours. CBG: Recent Labs  Lab 10/26/22 0329 10/26/22 0607 10/26/22 1215  GLUCAP 72 148* 195*   D-Dimer: No results for input(s): "DDIMER" in the last 72 hours. Hgb A1c: Recent Labs    10/25/22 2053  HGBA1C 4.6*   Lipid Profile: Recent Labs    10/25/22 1351  CHOL 135  HDL 43  LDLCALC 70  TRIG 111  CHOLHDL 3.1   Thyroid function studies: No results for input(s): "TSH", "T4TOTAL", "T3FREE", "THYROIDAB" in the last 72 hours.  Invalid input(s): "FREET3" Anemia work up: Recent Labs    10/25/22 1351 10/25/22 2053  VITAMINB12 420  --   FOLATE  --  8.1  FERRITIN  --  187  TIBC  --  200*  IRON  --  7*  RETICCTPCT  --  2.2   Sepsis Labs: Recent Labs  Lab 10/25/22 1342 10/25/22 1351 10/25/22 2017 10/25/22 2053 10/26/22 0622 10/26/22 0806  WBC  --  12.5*  --  9.1 4.4 5.6  LATICACIDVEN 1.6  --  1.0  --   --   --     Microbiology Recent Results (from the past 240 hour(s))  Blood culture (routine x 2)     Status: None (Preliminary result)   Collection Time: 10/25/22  1:51 PM   Specimen: BLOOD  Result Value Ref Range Status   Specimen Description BLOOD LEFT ANTECUBITAL  Final   Special Requests   Final    BOTTLES DRAWN AEROBIC ONLY Blood  Culture results may not be optimal due to an excessive volume of blood received in culture bottles   Culture   Final    NO GROWTH < 24 HOURS Performed at [facility], [address], [place], [place] [address]    Report Status PENDING  Incomplete  Blood culture (routine x 2)     Status: None (Preliminary result)   Collection Time: 10/25/22  3:03 PM   Specimen: BLOOD LEFT FOREARM  Result Value Ref Range Status   Specimen Description BLOOD LEFT FOREARM  Final   Special Requests   Final    BOTTLES DRAWN AEROBIC AND ANAEROBIC Blood Culture results may not be optimal due to an excessive volume of blood received in culture bottles   Culture   Final    NO GROWTH < 24 HOURS Performed at [facility], [address], [place], [place] [address]    Report Status PENDING  Incomplete  Urine Culture     Status: None (Preliminary result)   Collection Time: 10/25/22  8:15 PM   Specimen: Urine, Clean Catch  Result Value Ref Range Status   Specimen Description URINE, CLEAN CATCH  Final   Special Requests   Final    NONE Reflexed from [id number] Performed at [facility], [address], [place], [place] [address]    Culture PENDING  Incomplete   Report Status PENDING  Incomplete  Respiratory (~20 pathogens) panel by PCR     Status: None   Collection Time: 10/25/22  9:07 PM   Specimen: Nasopharyngeal Swab; Respiratory  Result Value Ref Range Status   Adenovirus NOT DETECTED NOT DETECTED Final   Coronavirus 229E NOT DETECTED NOT DETECTED Final    Comment: (NOTE) The Coronavirus on the Respiratory Panel, DOES NOT test for the novel  Coronavirus (2019 nCoV)    Coronavirus HKU1 NOT DETECTED NOT DETECTED Final   Coronavirus NL63 NOT DETECTED NOT DETECTED Final   Coronavirus OC43 NOT DETECTED NOT DETECTED Final   Metapneumovirus NOT DETECTED NOT DETECTED Final   Rhinovirus / Enterovirus NOT DETECTED NOT DETECTED Final   Influenza A NOT DETECTED NOT DETECTED Final   Influenza B NOT  DETECTED NOT DETECTED Final   Parainfluenza Virus 1 NOT DETECTED NOT DETECTED Final   Parainfluenza Virus 2 NOT DETECTED NOT DETECTED Final   Parainfluenza Virus 3 NOT DETECTED NOT DETECTED Final   Parainfluenza Virus 4 NOT DETECTED NOT DETECTED Final   Respiratory Syncytial Virus NOT DETECTED NOT DETECTED Final   Bordetella pertussis NOT DETECTED NOT DETECTED Final   Bordetella Parapertussis NOT DETECTED NOT DETECTED Final   Chlamydophila pneumoniae NOT DETECTED NOT DETECTED Final   Mycoplasma pneumoniae NOT DETECTED NOT DETECTED Final    Comment: Performed at [facility], [address], [place], [place] [address]  MRSA Next Gen by PCR, Nasal     Status: Abnormal   Collection Time: 10/26/22  2:58 AM   Specimen: Nasal Mucosa; Nasal Swab  Result Value Ref Range Status  MRSA by PCR Next Gen DETECTED (A) NOT DETECTED Final    Comment: RESULT CALLED TO, READ BACK BY AND VERIFIED WITH: S. [facility] RN 10/26/22 @ 0404 BY AB (NOTE) The GeneXpert MRSA Assay (FDA approved for NASAL specimens only), is one component of a comprehensive MRSA colonization surveillance program. It is not intended to diagnose MRSA infection nor to guide or monitor treatment for MRSA infections. Test performance is not FDA approved in patients less than 88 years old. Performed at [facility], [address], [place], [place] [address]     Procedures and diagnostic studies:  CT ABDOMEN PELVIS WO CONTRAST  Result Date: 10/25/2022 CLINICAL DATA:  Shortness of breath and chest pain EXAM: CT ABDOMEN AND PELVIS WITHOUT CONTRAST TECHNIQUE: Multidetector CT imaging of the abdomen and pelvis was performed following the standard protocol without IV contrast. RADIATION DOSE REDUCTION: This exam was performed according to the departmental dose-optimization program which includes automated exposure control, adjustment of the mA and/or kV according to patient size and/or use of iterative reconstruction technique.  COMPARISON:  CT abdomen and pelvis dated 12/31/2018 FINDINGS: Lower chest: Left lower lobe consolidation with air bronchograms. No pleural effusion or pneumothorax demonstrated. Partially imaged heart size is normal. Coronary artery calcifications. Hepatobiliary: Subcentimeter segment 3 hypodensities, too small to characterize, but unchanged from 12/31/2018. No intra or extrahepatic biliary ductal dilation. Normal gallbladder. Pancreas: No focal lesions or main ductal dilation. Spleen: Normal in size without focal abnormality. Adrenals/Urinary Tract: No adrenal nodules. 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 No suspicious renal mass, calculi or hydronephrosis. Gas within the urinary bladder, which demonstrates mild mural thickening and pericystic stranding. Stomach/Bowel: Normal appearance of the stomach. No evidence of bowel wall thickening, distention, or inflammatory changes. Normal appendix. Vascular/Lymphatic: Aortic atherosclerosis. No enlarged abdominal or pelvic lymph nodes. Reproductive: No adnexal masses. Other: Trace free fluid.  No fluid collection or free air. Musculoskeletal: No acute or abnormal lytic or blastic osseous lesions. Multilevel degenerative changes of the partially imaged thoracic and lumbar spine. IMPRESSION: 1. Left lower lobe consolidation with air bronchograms, suspicious for pneumonia. 2. Gas within the urinary bladder, which demonstrates mild mural thickening and pericystic stranding, suspicious for cystitis. Recommend correlation with urinalysis. 3. Aortic Atherosclerosis (ICD10-I70.0). Coronary artery calcifications. Assessment for potential risk factor modification, dietary therapy or pharmacologic therapy may be warranted, if clinically indicated. Electronically Signed   By: Agustin Cree M.D.   On: 10/25/2022 16:22   DG Chest Portable 1 View  Result Date: 10/25/2022 CLINICAL DATA:  Shortness of breath EXAM: PORTABLE CHEST 1 VIEW COMPARISON:  Previous studies including  the examination of 08/19/2022 FINDINGS: Transverse diameter of heart is slightly increased. Tip of right IJ dialysis catheter is seen in the region of superior vena cava. There is interval clearing of pulmonary vascular congestion and pulmonary edema. There is focal patchy infiltrate in right mid and right lower lung fields suggesting pneumonia. Lateral CP angles are clear. There is no pneumothorax. IMPRESSION: There are patchy alveolar infiltrates in right mid and right lower lung fields suggesting pneumonia. There are no signs of alveolar pulmonary edema. There is no pleural effusion or pneumothorax. Electronically Signed   By: [name] Avena M.D.   On: 10/25/2022 15:03               LOS: 1 day      [facility]   Pager on [email or web address]. If 7PM-7AM, please contact night-coverage at [email or web address]     10/26/2022, 1:00 PM

## 2022-10-26 NOTE — Progress Notes (Signed)
Pharmacy Antibiotic Note  [name] is a 62 y.o. female for which pharmacy has been consulted for cefepime and vancomycin dosing for pneumonia.  Patient with a history of ESRD on HD, HTN, PAD, GED, anemia, COPD, hepatitis C, HLD, hx of GIB. Patient presenting with SOB and chest pain.  CXR concerning for pna  WBC 12.5; LA 1.6; T 100.1; HR 101; RR 30  Plan: Vancomycin 1g given x 1 in ED > HD last pm will redose vancomycin 500mg  IV x1 then follow up next HD level  Cefepime 1g q24hr (dose to be given post-HD on HD days)   Height: 5\' 7"  (170.2 cm) Weight: 44.7 kg (98 lb 8.7 oz) IBW/kg (Calculated) : 61.6  Temp (24hrs), Avg:98.9 F (37.2 C), Min:98 F (36.7 C), Max:100.7 F (38.2 C)  Recent Labs  Lab 10/25/22 1341 10/25/22 1342 10/25/22 1351 10/25/22 2017 10/25/22 2053 10/26/22 0622 10/26/22 0806  WBC  --   --  12.5*  --  9.1 4.4 5.6  CREATININE 5.30*  --  4.80*  --  4.69*  --  2.24*  LATICACIDVEN  --  1.6  --  1.0  --   --   --     Estimated Creatinine Clearance: 18.4 mL/min (A) (by C-G formula based on SCr of 2.24 mg/dL (H)).    Allergies  Allergen Reactions   Zestril [Lisinopril] Other (See Comments)    Hyperkalemia   Microbiology results: 8/9 Ucx GNR   Leota Sauers Pharm.D. CPP, BCPS Clinical Pharmacist [phone] 10/26/2022 2:55 PM

## 2022-10-26 NOTE — Progress Notes (Signed)
RT unable to get ABG after attempt x 2. Pt stated that she can not tolerate another RT attempting ABG draw. RT notified RN.

## 2022-10-26 NOTE — Progress Notes (Signed)
Md made aware  about waiting  for the repeat cbc result before starting blood transfusion. Result still pending.

## 2022-10-26 NOTE — Progress Notes (Signed)
Result of cbc just came out  hgb-7.4  md made aware   who gave order to give 1 unit of prbc.

## 2022-10-26 NOTE — Progress Notes (Signed)
Called main lab to get result of cbc and bmet done at 720 am and claimed that analyzer still  off and to get result later. With history of + ESBL in the culture  08/15/22. Placed on contact isolation per protocol.

## 2022-10-26 NOTE — Progress Notes (Signed)
Lab called and requested for cbc and bmet  to be redrawn since results came out way out  of  the norm.  CBC redrawn  by same phlebotomist who claimed that blood drawn   below the ivf . Awaiting result.

## 2022-10-27 DIAGNOSIS — J9601 Acute respiratory failure with hypoxia: Secondary | ICD-10-CM | POA: Diagnosis not present

## 2022-10-27 DIAGNOSIS — R195 Other fecal abnormalities: Secondary | ICD-10-CM | POA: Diagnosis not present

## 2022-10-27 DIAGNOSIS — K31819 Angiodysplasia of stomach and duodenum without bleeding: Secondary | ICD-10-CM

## 2022-10-27 DIAGNOSIS — R6510 Systemic inflammatory response syndrome (SIRS) of non-infectious origin without acute organ dysfunction: Secondary | ICD-10-CM | POA: Diagnosis not present

## 2022-10-27 DIAGNOSIS — D649 Anemia, unspecified: Secondary | ICD-10-CM | POA: Diagnosis not present

## 2022-10-27 DIAGNOSIS — J189 Pneumonia, unspecified organism: Secondary | ICD-10-CM | POA: Diagnosis not present

## 2022-10-27 DIAGNOSIS — K2289 Other specified disease of esophagus: Secondary | ICD-10-CM

## 2022-10-27 LAB — COMPREHENSIVE METABOLIC PANEL WITH GFR
ALT: 13 U/L (ref 0–44)
AST: 26 U/L (ref 15–41)
Albumin: 1.9 g/dL — ABNORMAL LOW (ref 3.5–5.0)
Alkaline Phosphatase: 55 U/L (ref 38–126)
Anion gap: 19 — ABNORMAL HIGH (ref 5–15)
BUN: 42 mg/dL — ABNORMAL HIGH (ref 8–23)
CO2: 19 mmol/L — ABNORMAL LOW (ref 22–32)
Calcium: 8 mg/dL — ABNORMAL LOW (ref 8.9–10.3)
Chloride: 91 mmol/L — ABNORMAL LOW (ref 98–111)
Creatinine, Ser: 2.93 mg/dL — ABNORMAL HIGH (ref 0.44–1.00)
GFR, Estimated: 18 mL/min — ABNORMAL LOW (ref >60)
Glucose, Bld: 142 mg/dL — ABNORMAL HIGH (ref 70–99)
Potassium: 3.4 mmol/L — ABNORMAL LOW (ref 3.5–5.1)
Sodium: 129 mmol/L — ABNORMAL LOW (ref 135–145)
Total Bilirubin: 0.4 mg/dL (ref 0.3–1.2)
Total Protein: 5.2 g/dL — ABNORMAL LOW (ref 6.5–8.1)

## 2022-10-27 LAB — CBC
HCT: 24.1 % — ABNORMAL LOW (ref 36.0–46.0)
Hemoglobin: 8.2 g/dL — ABNORMAL LOW (ref 12.0–15.0)
MCH: 31.5 pg (ref 26.0–34.0)
MCHC: 34 g/dL (ref 30.0–36.0)
MCV: 92.7 fL (ref 80.0–100.0)
Platelets: UNDETERMINED 10*3/uL (ref 150–400)
RBC: 2.6 MIL/uL — ABNORMAL LOW (ref 3.87–5.11)
RDW: 18.5 % — ABNORMAL HIGH (ref 11.5–15.5)
WBC: 6 10*3/uL (ref 4.0–10.5)
nRBC: 0 % (ref 0.0–0.2)

## 2022-10-27 LAB — HEMOGLOBIN AND HEMATOCRIT, BLOOD
HCT: 24.5 % — ABNORMAL LOW (ref 36.0–46.0)
HCT: 23.7 % — ABNORMAL LOW (ref 36.0–46.0)
HCT: 27.7 % — ABNORMAL LOW (ref 36.0–46.0)
Hemoglobin: 8.6 g/dL — ABNORMAL LOW (ref 12.0–15.0)
Hemoglobin: 8.3 g/dL — ABNORMAL LOW (ref 12.0–15.0)
Hemoglobin: 9.4 g/dL — ABNORMAL LOW (ref 12.0–15.0)

## 2022-10-27 LAB — GLUCOSE, CAPILLARY
Glucose-Capillary: 116 mg/dL — ABNORMAL HIGH (ref 70–99)
Glucose-Capillary: 133 mg/dL — ABNORMAL HIGH (ref 70–99)
Glucose-Capillary: 136 mg/dL — ABNORMAL HIGH (ref 70–99)

## 2022-10-27 LAB — MAGNESIUM: Magnesium: 2 mg/dL (ref 1.7–2.4)

## 2022-10-27 MED ORDER — CHLORHEXIDINE GLUCONATE CLOTH 2 % EX PADS
6.0000 | MEDICATED_PAD | Freq: Every day | CUTANEOUS | Status: DC
  Administered 2022-10-27 – 2022-11-02 (×6): 6 via TOPICAL

## 2022-10-27 MED ORDER — PANTOPRAZOLE SODIUM 40 MG PO TBEC
40.0000 mg | DELAYED_RELEASE_TABLET | Freq: Two times a day (BID) | ORAL | Status: DC
  Administered 2022-10-27 – 2022-11-02 (×13): 40 mg via ORAL
  Filled 2022-10-27 (×13): qty 1

## 2022-10-27 NOTE — Plan of Care (Signed)

## 2022-10-27 NOTE — Progress Notes (Signed)
[facility] Progress Note   Subjective:   Pt seen in room. She feels her breathing is better but noted she is on HFLC and desats slightly when she talks. Denies CP, dizziness, N/V. She is stressed about social issues- reports she is unhoused and does not have a cell phone.   Objective Vitals:   10/26/22 2117 10/26/22 2334 10/27/22 0411 10/27/22 0723  BP:  125/66    Pulse: 84 89    Resp: 16 16 16    Temp:  (!) 97.5 F (36.4 C) (!) 97.5 F (36.4 C) 97.6 F (36.4 C)  TempSrc:  Oral Oral Oral  SpO2:  98%    Weight:   47.4 kg   Height:       Physical Exam General: Alert female, chronically ill appearing, NAD Heart: RRR, no murmurs, rubs or gallops Lungs: Diminished in bases, otherwise CTA bilaterally Abdomen: Soft, non-distended, +BS Extremities: No edema b/l lower extremities Dialysis Access:  TDC, RUE AVF +bruit  Additional Objective Labs: Basic Metabolic Panel: Recent Labs  Lab 10/25/22 2053 10/26/22 0806 10/27/22 0032  NA 130* 130* 129*  K 3.4* 2.8* 3.4*  CL 102 94* 91*  CO2 11* 22 19*  GLUCOSE 99 223* 142*  BUN 74* 25* 42*  CREATININE 4.69* 2.24* 2.93*  CALCIUM 7.8* 7.6* 8.0*  PHOS 5.8*  --   --    Liver Function Tests: Recent Labs  Lab 10/25/22 1351 10/25/22 2053 10/27/22 0032  AST 23  --  26  ALT 15  --  13  ALKPHOS 76  --  55  BILITOT 0.6  --  0.4  PROT 6.3*  --  5.2*  ALBUMIN 2.5* 2.4* 1.9*   Recent Labs  Lab 10/25/22 1351  LIPASE 41   CBC: Recent Labs  Lab 10/25/22 1351 10/25/22 1354 10/25/22 2053 10/26/22 0622 10/26/22 0806 10/26/22 1816 10/27/22 0032  WBC 12.5*  --  9.1 4.4 5.6  --  6.0  NEUTROABS 11.4*  --  8.2*  --   --   --   --   HGB 6.5*   < > 8.1* 5.5* 7.4* 7.8* 8.2*  HCT 19.3*   < > 24.3* 15.2* 21.2* 22.2* 24.1*  MCV 100.0  --  94.9 90.5 91.8  --  92.7  PLT 278  --  248 153 189  --  PLATELET CLUMPS NOTED ON SMEAR, UNABLE TO ESTIMATE   < > = values in this interval not displayed.   Blood Culture     Component Value Date/Time   SDES URINE, CLEAN CATCH 10/25/2022 2015   SPECREQUEST NONE Reflexed from [id number] 10/25/2022 2015   CULT (A) 10/25/2022 2015    >=100,000 COLONIES/mL KLEBSIELLA PNEUMONIAE SUSCEPTIBILITIES TO FOLLOW Performed at [facility], [address], [place], [place] [address]    REPTSTATUS PENDING 10/25/2022 2015    Cardiac Enzymes: No results for input(s): "CKTOTAL", "CKMB", "CKMBINDEX", "TROPONINI" in the last 168 hours. CBG: Recent Labs  Lab 10/26/22 0329 10/26/22 0607 10/26/22 1215 10/26/22 1605 10/26/22 2116  GLUCAP 72 148* 195* 190* 114*   Iron Studies:  Recent Labs    10/25/22 2053  IRON 7*  TIBC 200*  FERRITIN 187   @lablastinr3 @ Studies/Results: CT ABDOMEN PELVIS WO CONTRAST  Result Date: 10/25/2022 CLINICAL DATA:  Shortness of breath and chest pain EXAM: CT ABDOMEN AND PELVIS WITHOUT CONTRAST TECHNIQUE: Multidetector CT imaging of the abdomen and pelvis was performed following the standard protocol without IV contrast. RADIATION DOSE REDUCTION: This exam was  performed according to the departmental dose-optimization program which includes automated exposure control, adjustment of the mA and/or kV according to patient size and/or use of iterative reconstruction technique. COMPARISON:  CT abdomen and pelvis dated 12/31/2018 FINDINGS: Lower chest: Left lower lobe consolidation with air bronchograms. No pleural effusion or pneumothorax demonstrated. Partially imaged heart size is normal. Coronary artery calcifications. Hepatobiliary: Subcentimeter segment 3 hypodensities, too small to characterize, but unchanged from 12/31/2018. No intra or extrahepatic biliary ductal dilation. Normal gallbladder. Pancreas: No focal lesions or main ductal dilation. Spleen: Normal in size without focal abnormality. Adrenals/Urinary Tract: No adrenal nodules. 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 No suspicious renal mass, calculi or hydronephrosis. Gas within  the urinary bladder, which demonstrates mild mural thickening and pericystic stranding. Stomach/Bowel: Normal appearance of the stomach. No evidence of bowel wall thickening, distention, or inflammatory changes. Normal appendix. Vascular/Lymphatic: Aortic atherosclerosis. No enlarged abdominal or pelvic lymph nodes. Reproductive: No adnexal masses. Other: Trace free fluid.  No fluid collection or free air. Musculoskeletal: No acute or abnormal lytic or blastic osseous lesions. Multilevel degenerative changes of the partially imaged thoracic and lumbar spine. IMPRESSION: 1. Left lower lobe consolidation with air bronchograms, suspicious for pneumonia. 2. Gas within the urinary bladder, which demonstrates mild mural thickening and pericystic stranding, suspicious for cystitis. Recommend correlation with urinalysis. 3. Aortic Atherosclerosis (ICD10-I70.0). Coronary artery calcifications. Assessment for potential risk factor modification, dietary therapy or pharmacologic therapy may be warranted, if clinically indicated. Electronically Signed   By: Agustin Cree M.D.   On: 10/25/2022 16:22   DG Chest Portable 1 View  Result Date: 10/25/2022 CLINICAL DATA:  Shortness of breath EXAM: PORTABLE CHEST 1 VIEW COMPARISON:  Previous studies including the examination of 08/19/2022 FINDINGS: Transverse diameter of heart is slightly increased. Tip of right IJ dialysis catheter is seen in the region of superior vena cava. There is interval clearing of pulmonary vascular congestion and pulmonary edema. There is focal patchy infiltrate in right mid and right lower lung fields suggesting pneumonia. Lateral CP angles are clear. There is no pneumothorax. IMPRESSION: There are patchy alveolar infiltrates in right mid and right lower lung fields suggesting pneumonia. There are no signs of alveolar pulmonary edema. There is no pleural effusion or pneumothorax. Electronically Signed   By: [name] Avena M.D.   On: 10/25/2022 15:03    Medications:  sodium chloride     ceFEPime (MAXIPIME) IV 1 g (10/26/22 1711)   pantoprazole (PROTONIX) IV 300 mL/hr at 10/27/22 0849    sodium chloride   Intravenous Once   sodium chloride   Intravenous Once   atorvastatin  10 mg Oral Daily   Chlorhexidine Gluconate Cloth  6 each Topical Q0600   Chlorhexidine Gluconate Cloth  6 each Topical Q0600   [START ON 10/28/2022] darbepoetin (ARANESP) injection - DIALYSIS  100 mcg Subcutaneous Q Mon-1800   docusate sodium  100 mg Oral BID   gabapentin  100 mg Oral TID   guaiFENesin  600 mg Oral BID   methylPREDNISolone (SOLU-MEDROL) injection  80 mg Intravenous Daily   mupirocin ointment  1 Application Nasal BID   sodium bicarbonate  1,300 mg Oral TID   sodium chloride flush  3 mL Intravenous Q12H   vancomycin variable dose per unstable renal function (pharmacist dosing)   Does not apply See admin instructions    OP Dialysis Orders: Previously at [facility], unable to obtain records- may have been discharged from clinic   Assessment/Plan: Missed dialysis / ESRD- She reports  she missed 1 month of dialysis but was feeling well until this week. Presented with SOB, slightly improved s/p HD overnight. CXR consistently with pneumonia, on antibiotics. Cr 4's, BUN 74 pre-HD, however she has severe metabolic acidosis and is not compliant with meds so she would benefit from ongoing HD. Will plan for next HD Monday, OP schedule TBD, will need to start CLIP process again. Pt may have been discharged from clinic if she has not attended in a month. Use 4K bath- K+ runs low.  Metabolic acidosis: bicarb 7 -> 11. Initially on bicarb drip. Not compliant with PO bicarb or HD outpatient. Improved, continue PO sodium bicarb for now  HTN/ volume - BP initially elevated, improved post HD. 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 GI onboard. Has not had ESA recently, will restart aranesp and check iron levels.  MBD ckd - CCa in range, phos 5.8-  if remains elevated will need to start a binder Dialysis access- Has [facility], looks like she missed calls from vascular surgery to schedule second stage surgery, will need to follow up outpatient.   Rogers Blocker, PA-C 10/27/2022, 9:16 AM  [facility] Pager: [phone]

## 2022-10-27 NOTE — Progress Notes (Signed)
Progress Note    [name]  WUJ:[id number] DOB: May 15, 1960  DOA: 10/25/2022 PCP: [name], MD      Brief Narrative:    Medical records reviewed and are as summarized below:  [name] is a 62 y.o. 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  Patient being started on hemodialysis in May however due to homeless status patient is unable to maintain dialysis and she could not get hemodialysis  for last few weeks.    ED Course:  At presentation to ED patient found to be have low-grade fever 100.7 F, respiratory rate 25, tachycardic to 112, elevated blood pressure 169/89 and developed acute hypoxic respiratory failure O2 sat dropped to 82% currently maintaining 100% on HFNC 10 L/min. VBG showed 7 point 05/05/36/8.2 and elevated anion gap deficient 27. Normal lactic acid 1.6. CBC showed leukocytosis 12.5, low hemoglobin 6.5, low hematocrit 19.  3, RBC 1.93 and normal platelet 278. CMP showed low sodium 128 (baseline 128 to 133), potassium 3.5, chloride 102, bicarb 9, glucose 107, BUN 75, creatinine 4.8, elevated BUN 75, calcium 8, total protein 6.3, albumin 2.5, GFR 10 and gap 17. Lipase 41 within normal range. Low mag 1.6. CT abdomen pelvis showed 1.left lower lobe consolidation with air bronchogram suspicion for pneumonia.  Gas within the urinary bladder, which demonstrates mild mural thickening and pericystic stranding, suspicious for cystitis    Assessment/Plan:   Principal Problem:   Right lower lobe pneumonia Active Problems:   Metabolic acidosis   Chronic hyponatremia   Acute on chronic blood loss anemia   Acute  hypoxic respiratory failure (HCC)   ESRD on dialysis-noncompliant with outpatient dialysis due to homeless [facility])   History of arteriovenous malformation (AVM)   COPD with acute exacerbation (HCC)   SIRS (systemic inflammatory response syndrome) (HCC)   Essential hypertension   Hepatitis C   Chronic disease anemia   GERD (gastroesophageal reflux disease)   Chronic diastolic CHF (congestive heart failure) (HCC)   Premature atrial complex due to COPD exacerbation and acute hypoxic respiratory failure   Chronic pain syndrome   Angiodysplasia of upper gastrointestinal tract    Body mass index is 16.37 kg/m.    Sepsis secondary to community-acquired pneumonia, bilateral pneumonia: Continue IV cefepime and vancomycin.  Follow-up blood cultures. 1 out of 4 blood culture bottles showed Staph epidermidis and gram-positive rods.  These are probably contaminants.  Follow-up final blood culture report.  Urine culture showed multidrug-resistant Klebsiella pneumoniae.  She has history of Klebsiella pneumoniae and E. coli from urine culture on 08/03/2018 and Klebsiella pneumoniae from urine culture on 08/15/2022.  It is not clear whether this represents colonization versus true infection.   She is already on IV cefepime.  COPD exacerbation: Continue steroids and bronchodilators.   Acute hypoxic respiratory failure: Slowly improving.  She has been weaned down from 10 L/min to 3 L/min oxygen via [place].   ESRD: He had hemodialysis in the early hours of 10/26/2022.  Follow-up with nephrologist for hemodialysis.   Severe anemia: Hemoglobin is up to 8.3.  S/p transfusion with 1 unit of PRBCs on 10/25/2022.  S/p transfusion with 1 unit of PRBCs on 10/26/2022.Marland Kitchen  Suspected GI bleed, history of recurrent GI bleed, history of AVM: Continue IV Protonix.  Gastroenterologist is considering repeat EGD once acute issues resolve.  Follow-up with gastroenterologist.   Metabolic acidosis: Resolved.  Completed IV  sodium bicarbonate infusion.  Monitor BMP.   Hypokalemia: Potassium improved from 2.8-3.4.  Monitor potassium level and replete as needed.     Chronic hyponatremia: Asymptomatic.   Homelessness: Follow-up with social worker to assist with disposition.  Diet Order             Diet renal with fluid restriction Fluid restriction: 1500 mL Fluid; Room service appropriate? Yes; Fluid consistency: Thin  Diet effective now                            Consultants: Nephrologist Gastroenterologist  Procedures: None    Medications:    sodium chloride   Intravenous Once   sodium chloride   Intravenous Once   atorvastatin  10 mg Oral Daily   Chlorhexidine Gluconate Cloth  6 each Topical Q0600   Chlorhexidine Gluconate Cloth  6 each Topical Q0600   Chlorhexidine Gluconate Cloth  6 each Topical Q0600   [START ON 10/28/2022] darbepoetin (ARANESP) injection - DIALYSIS  100 mcg Subcutaneous Q Mon-1800   docusate sodium  100 mg Oral BID   gabapentin  100 mg Oral TID   guaiFENesin  600 mg Oral BID   methylPREDNISolone (SOLU-MEDROL) injection  80 mg Intravenous Daily   mupirocin ointment  1 Application Nasal BID   pantoprazole  40 mg Oral BID   sodium bicarbonate  1,300 mg Oral TID   sodium chloride flush  3 mL Intravenous Q12H   vancomycin variable dose per unstable renal function (pharmacist dosing)   Does not apply See admin instructions   Continuous Infusions:  sodium chloride     ceFEPime (MAXIPIME) IV 1 g (10/26/22 1711)     Anti-infectives (From admission, onward)    Start     Dose/Rate Route Frequency Ordered Stop   10/26/22 1800  ceFEPIme (MAXIPIME) 1 g in sodium chloride 0.9 % 100 mL IVPB        1 g 200 mL/hr over 30 Minutes Intravenous Every 24 hours 10/25/22 2111     10/26/22 1530  vancomycin (VANCOREADY) IVPB 500 mg/100 mL        500 mg 100 mL/hr over 60 Minutes Intravenous  Once 10/26/22 1442 10/26/22 1709   10/25/22 2115  vancomycin variable dose per  unstable renal function (pharmacist dosing)         Does not apply See admin instructions 10/25/22 2115     10/25/22 1815  ceFEPIme (MAXIPIME) 1 g in sodium chloride 0.9 % 100 mL IVPB        1 g 200 mL/hr over 30 Minutes Intravenous  Once 10/25/22 1806 10/25/22 1925   10/25/22 1800  vancomycin (VANCOCIN) IVPB 1000 mg/200 mL premix        1,000 mg 200 mL/hr over 60 Minutes Intravenous  Once 10/25/22 1757 10/25/22 2003              Family Communication/Anticipated D/C date and plan/Code Status   DVT prophylaxis: SCDs Start: 10/25/22 2022     Code Status: Full Code  Family Communication: None Disposition Plan: Plan to discharge home in 2 to 3 days   Status is: Inpatient Remains inpatient appropriate because: Pneumonia on IV antibiotics, anemia       Subjective:   Interval events  noted.  She complains of dry cough.  She is still breathing is better.  No vomiting or diarrhea.    Objective:    Vitals:   10/26/22 2334 10/27/22 0411 10/27/22 0723 10/27/22 1137  BP: 125/66   137/76  Pulse: 89     Resp: 16 16    Temp: (!) 97.5 F (36.4 C) (!) 97.5 F (36.4 C) 97.6 F (36.4 C) 97.6 F (36.4 C)  TempSrc: Oral Oral Oral Oral  SpO2: 98%   100%  Weight:  47.4 kg    Height:       No data found.   Intake/Output Summary (Last 24 hours) at 10/27/2022 1449 Last data filed at 10/27/2022 0849 Gross per 24 hour  Intake 775 ml  Output --  Net 775 ml   Filed Weights   10/26/22 0221 10/26/22 0500 10/27/22 0411  Weight: 42.5 kg 44.7 kg 47.4 kg    Exam:  GEN: NAD SKIN: Warm and dry EYES: EOMI ENT: MMM CV: RRR PULM: Bilateral rhonchi ABD: soft, ND, NT, +BS CNS: AAO x 3, non focal EXT: No edema or tenderness      Data Reviewed:   I have personally reviewed following labs and imaging studies:  Labs: Labs show the following:   Basic Metabolic Panel: Recent Labs  Lab 10/25/22 1341 10/25/22 1351 10/25/22 1354 10/25/22 2053 10/26/22 0806  10/26/22 1810 10/27/22 0032  NA 128*  129* 128* 129* 130* 130*  --  129*  K 3.5  3.5 3.5 3.5 3.4* 2.8*  --  3.4*  CL 105 102  --  102 94*  --  91*  CO2  --  9*  --  11* 22  --  19*  GLUCOSE 106* 107*  --  99 223*  --  142*  BUN 79* 75*  --  74* 25*  --  42*  CREATININE 5.30* 4.80*  --  4.69* 2.24*  --  2.93*  CALCIUM  --  8.0*  --  7.8* 7.6*  --  8.0*  MG  --  1.6*  --   --   --  1.8 2.0  PHOS  --   --   --  5.8*  --   --   --    GFR Estimated Creatinine Clearance: 14.9 mL/min (A) (by C-G formula based on SCr of 2.93 mg/dL (H)). Liver Function Tests: Recent Labs  Lab 10/25/22 1351 10/25/22 2053 10/27/22 0032  AST 23  --  26  ALT 15  --  13  ALKPHOS 76  --  55  BILITOT 0.6  --  0.4  PROT 6.3*  --  5.2*  ALBUMIN 2.5* 2.4* 1.9*   Recent Labs  Lab 10/25/22 1351  LIPASE 41   No results for input(s): "AMMONIA" in the last 168 hours. Coagulation profile No results for input(s): "INR", "PROTIME" in the last 168 hours.  CBC: Recent Labs  Lab 10/25/22 1351 10/25/22 1354 10/25/22 2053 10/26/22 0622 10/26/22 0806 10/26/22 1816 10/27/22 0032 10/27/22 0911 10/27/22 1340  WBC 12.5*  --  9.1 4.4 5.6  --  6.0  --   --   NEUTROABS 11.4*  --  8.2*  --   --   --   --   --   --   HGB 6.5*   < > 8.1* 5.5* 7.4* 7.8* 8.2* 8.6* 8.3*  HCT 19.3*   < > 24.3* 15.2* 21.2* 22.2* 24.1* 24.5* 23.7*  MCV 100.0  --  94.9 90.5 91.8  --  92.7  --   --   PLT 278  --  248 153 189  --  PLATELET CLUMPS NOTED ON SMEAR, UNABLE TO ESTIMATE  --   --    < > = values in this interval not displayed.   Cardiac Enzymes: No results for input(s): "CKTOTAL", "CKMB", "CKMBINDEX", "TROPONINI" in the last 168 hours. BNP (last 3 results) No results for input(s): "PROBNP" in the last 8760 hours. CBG: Recent Labs  Lab 10/26/22 0607 10/26/22 1215 10/26/22 1605 10/26/22 2116 10/27/22 1139  GLUCAP 148* 195* 190* 114* 116*   D-Dimer: No results for input(s): "DDIMER" in the last 72 hours. Hgb  A1c: Recent Labs    10/25/22 2053  HGBA1C 4.6*   Lipid Profile: Recent Labs    10/25/22 1351  CHOL 135  HDL 43  LDLCALC 70  TRIG 111  CHOLHDL 3.1   Thyroid function studies: No results for input(s): "TSH", "T4TOTAL", "T3FREE", "THYROIDAB" in the last 72 hours.  Invalid input(s): "FREET3" Anemia work up: Recent Labs    10/25/22 1351 10/25/22 2053  VITAMINB12 420  --   FOLATE  --  8.1  FERRITIN  --  187  TIBC  --  200*  IRON  --  7*  RETICCTPCT  --  2.2   Sepsis Labs: Recent Labs  Lab 10/25/22 1342 10/25/22 1351 10/25/22 2017 10/25/22 2053 10/26/22 0622 10/26/22 0806 10/27/22 0032  WBC  --    < >  --  9.1 4.4 5.6 6.0  LATICACIDVEN 1.6  --  1.0  --   --   --   --    < > = values in this interval not displayed.    Microbiology Recent Results (from the past 240 hour(s))  Blood culture (routine x 2)     Status: Abnormal (Preliminary result)   Collection Time: 10/25/22  1:51 PM   Specimen: BLOOD  Result Value Ref Range Status   Specimen Description BLOOD LEFT ANTECUBITAL  Final   Special Requests   Final    BOTTLES DRAWN AEROBIC ONLY Blood Culture results may not be optimal due to an excessive volume of blood received in culture bottles   Culture  Setup Time   Final    GRAM POSITIVE COCCI GRAM POSITIVE RODS AEROBIC BOTTLE ONLY CRITICAL RESULT CALLED TO, READ BACK BY AND VERIFIED WITH: PHARMD MADISON OWEN [id number] 1656 BY J RAZZAK, MT    Culture (A)  Final    STAPHYLOCOCCUS EPIDERMIDIS THE SIGNIFICANCE OF ISOLATING THIS ORGANISM FROM A SINGLE SET OF BLOOD CULTURES WHEN MULTIPLE SETS ARE DRAWN IS UNCERTAIN. PLEASE NOTIFY THE MICROBIOLOGY DEPARTMENT WITHIN ONE WEEK IF SPECIATION AND SENSITIVITIES ARE REQUIRED. GRAM POSITIVE RODS CULTURE REINCUBATED FOR BETTER GROWTH Performed at [facility], [address], [place], [place] [address]    Report Status PENDING  Incomplete  Blood Culture ID Panel (Reflexed)     Status: Abnormal   Collection Time:  10/25/22  1:51 PM  Result Value Ref Range Status   Enterococcus faecalis NOT DETECTED NOT DETECTED Final   Enterococcus Faecium NOT DETECTED NOT DETECTED Final   Listeria monocytogenes NOT DETECTED NOT DETECTED Final   Staphylococcus species DETECTED (A) NOT DETECTED Final    Comment: CRITICAL RESULT CALLED TO, READ BACK BY AND VERIFIED WITH: PHARMD MADISON OWEN [phone] 1656 BY J RAZZAK, MT    Staphylococcus aureus (BCID) NOT DETECTED NOT DETECTED Final   Staphylococcus epidermidis DETECTED (A) NOT DETECTED Final    Comment: Methicillin (oxacillin) resistant coagulase  negative staphylococcus. Possible blood culture contaminant (unless isolated from more than one blood culture draw or clinical case suggests pathogenicity). No antibiotic treatment is indicated for blood  culture contaminants. CRITICAL RESULT CALLED TO, READ BACK BY AND VERIFIED WITH: PHARMD MADISON Cornelius Moras [id number] 1656 BY J RAZZAK,MT    Staphylococcus lugdunensis NOT DETECTED NOT DETECTED Final   Streptococcus species NOT DETECTED NOT DETECTED Final   Streptococcus agalactiae NOT DETECTED NOT DETECTED Final   Streptococcus pneumoniae NOT DETECTED NOT DETECTED Final   Streptococcus pyogenes NOT DETECTED NOT DETECTED Final   A.calcoaceticus-baumannii NOT DETECTED NOT DETECTED Final   Bacteroides fragilis NOT DETECTED NOT DETECTED Final   Enterobacterales NOT DETECTED NOT DETECTED Final   Enterobacter cloacae complex NOT DETECTED NOT DETECTED Final   Escherichia coli NOT DETECTED NOT DETECTED Final   Klebsiella aerogenes NOT DETECTED NOT DETECTED Final   Klebsiella oxytoca NOT DETECTED NOT DETECTED Final   Klebsiella pneumoniae NOT DETECTED NOT DETECTED Final   Proteus species NOT DETECTED NOT DETECTED Final   Salmonella species NOT DETECTED NOT DETECTED Final   Serratia marcescens NOT DETECTED NOT DETECTED Final   Haemophilus influenzae NOT DETECTED NOT DETECTED Final   Neisseria meningitidis NOT DETECTED NOT DETECTED  Final   Pseudomonas aeruginosa NOT DETECTED NOT DETECTED Final   Stenotrophomonas maltophilia NOT DETECTED NOT DETECTED Final   Candida albicans NOT DETECTED NOT DETECTED Final   Candida auris NOT DETECTED NOT DETECTED Final   Candida glabrata NOT DETECTED NOT DETECTED Final   Candida krusei NOT DETECTED NOT DETECTED Final   Candida parapsilosis NOT DETECTED NOT DETECTED Final   Candida tropicalis NOT DETECTED NOT DETECTED Final   Cryptococcus neoformans/gattii NOT DETECTED NOT DETECTED Final   Methicillin resistance mecA/C DETECTED (A) NOT DETECTED Final    Comment: CRITICAL RESULT CALLED TO, READ BACK BY AND VERIFIED WITH: PHARMD MADISON OWEN [phone] 1656 BY Berline Chough, MT Performed at [facility], [address], [place], [place] [address]   Blood culture (routine x 2)     Status: None (Preliminary result)   Collection Time: 10/25/22  3:03 PM   Specimen: BLOOD LEFT FOREARM  Result Value Ref Range Status   Specimen Description BLOOD LEFT FOREARM  Final   Special Requests   Final    BOTTLES DRAWN AEROBIC AND ANAEROBIC Blood Culture results may not be optimal due to an excessive volume of blood received in culture bottles   Culture   Final    NO GROWTH < 24 HOURS Performed at [facility], [address], [place], [place] [address]    Report Status PENDING  Incomplete  Urine Culture     Status: Abnormal   Collection Time: 10/25/22  8:15 PM   Specimen: Urine, Clean Catch  Result Value Ref Range Status   Specimen Description URINE, CLEAN CATCH  Final   Special Requests   Final    NONE Reflexed from [id number] Performed at [facility], [address] [place], [place] [address]    Culture (A)  Final    >=100,000 COLONIES/mL KLEBSIELLA PNEUMONIAE Confirmed Extended Spectrum Beta-Lactamase Producer (ESBL).  In bloodstream infections from ESBL organisms, carbapenems are preferred over piperacillin/tazobactam. They are shown to have a lower risk of mortality.    Report  Status 10/27/2022 FINAL  Final   Organism ID, Bacteria KLEBSIELLA PNEUMONIAE (A)  Final      Susceptibility   Klebsiella pneumoniae - MIC*    AMPICILLIN >=32 RESISTANT Resistant  CEFAZOLIN >=64 RESISTANT Resistant     CEFEPIME >=32 RESISTANT Resistant     CEFTRIAXONE >=64 RESISTANT Resistant     CIPROFLOXACIN 0.5 INTERMEDIATE Intermediate     GENTAMICIN >=16 RESISTANT Resistant     IMIPENEM <=0.25 SENSITIVE Sensitive     NITROFURANTOIN 128 RESISTANT Resistant     TRIMETH/SULFA >=320 RESISTANT Resistant     AMPICILLIN/SULBACTAM >=32 RESISTANT Resistant     PIP/TAZO <=4 SENSITIVE Sensitive     * >=100,000 COLONIES/mL KLEBSIELLA PNEUMONIAE  Respiratory (~20 pathogens) panel by PCR     Status: None   Collection Time: 10/25/22  9:07 PM   Specimen: Nasopharyngeal Swab; Respiratory  Result Value Ref Range Status   Adenovirus NOT DETECTED NOT DETECTED Final   Coronavirus 229E NOT DETECTED NOT DETECTED Final    Comment: (NOTE) The Coronavirus on the Respiratory Panel, DOES NOT test for the novel  Coronavirus (2019 nCoV)    Coronavirus HKU1 NOT DETECTED NOT DETECTED Final   Coronavirus NL63 NOT DETECTED NOT DETECTED Final   Coronavirus OC43 NOT DETECTED NOT DETECTED Final   Metapneumovirus NOT DETECTED NOT DETECTED Final   Rhinovirus / Enterovirus NOT DETECTED NOT DETECTED Final   Influenza A NOT DETECTED NOT DETECTED Final   Influenza B NOT DETECTED NOT DETECTED Final   Parainfluenza Virus 1 NOT DETECTED NOT DETECTED Final   Parainfluenza Virus 2 NOT DETECTED NOT DETECTED Final   Parainfluenza Virus 3 NOT DETECTED NOT DETECTED Final   Parainfluenza Virus 4 NOT DETECTED NOT DETECTED Final   Respiratory Syncytial Virus NOT DETECTED NOT DETECTED Final   Bordetella pertussis NOT DETECTED NOT DETECTED Final   Bordetella Parapertussis NOT DETECTED NOT DETECTED Final   Chlamydophila pneumoniae NOT DETECTED NOT DETECTED Final   Mycoplasma pneumoniae NOT DETECTED NOT DETECTED Final     Comment: Performed at [facility], [address] [place], [place] [address]  MRSA Next Gen by PCR, Nasal     Status: Abnormal   Collection Time: 10/26/22  2:58 AM   Specimen: Nasal Mucosa; Nasal Swab  Result Value Ref Range Status   MRSA by PCR Next Gen DETECTED (A) NOT DETECTED Final    Comment: RESULT CALLED TO, READ BACK BY AND VERIFIED WITH: S. [facility] RN 10/26/22 @ 0404 BY AB (NOTE) The GeneXpert MRSA Assay (FDA approved for NASAL specimens only), is one component of a comprehensive MRSA colonization surveillance program. It is not intended to diagnose MRSA infection nor to guide or monitor treatment for MRSA infections. Test performance is not FDA approved in patients less than 59 years old. Performed at [facility], [address] [place], [place] [address]     Procedures and diagnostic studies:  CT ABDOMEN PELVIS WO CONTRAST  Result Date: 10/25/2022 CLINICAL DATA:  Shortness of breath and chest pain EXAM: CT ABDOMEN AND PELVIS WITHOUT CONTRAST TECHNIQUE: Multidetector CT imaging of the abdomen and pelvis was performed following the standard protocol without IV contrast. RADIATION DOSE REDUCTION: This exam was performed according to the departmental dose-optimization program which includes automated exposure control, adjustment of the mA and/or kV according to patient size and/or use of iterative reconstruction technique. COMPARISON:  CT abdomen and pelvis dated 12/31/2018 FINDINGS: Lower chest: Left lower lobe consolidation with air bronchograms. No pleural effusion or pneumothorax demonstrated. Partially imaged heart size is normal. Coronary artery calcifications. Hepatobiliary: Subcentimeter segment 3 hypodensities, too small to characterize, but unchanged from 12/31/2018. No intra or extrahepatic biliary ductal dilation. Normal gallbladder. Pancreas: No focal lesions  or main ductal dilation. Spleen: Normal in size without focal abnormality. Adrenals/Urinary Tract: No  adrenal nodules. 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 No suspicious renal mass, calculi or hydronephrosis. Gas within the urinary bladder, which demonstrates mild mural thickening and pericystic stranding. Stomach/Bowel: Normal appearance of the stomach. No evidence of bowel wall thickening, distention, or inflammatory changes. Normal appendix. Vascular/Lymphatic: Aortic atherosclerosis. No enlarged abdominal or pelvic lymph nodes. Reproductive: No adnexal masses. Other: Trace free fluid.  No fluid collection or free air. Musculoskeletal: No acute or abnormal lytic or blastic osseous lesions. Multilevel degenerative changes of the partially imaged thoracic and lumbar spine. IMPRESSION: 1. Left lower lobe consolidation with air bronchograms, suspicious for pneumonia. 2. Gas within the urinary bladder, which demonstrates mild mural thickening and pericystic stranding, suspicious for cystitis. Recommend correlation with urinalysis. 3. Aortic Atherosclerosis (ICD10-I70.0). Coronary artery calcifications. Assessment for potential risk factor modification, dietary therapy or pharmacologic therapy may be warranted, if clinically indicated. Electronically Signed   By: Agustin Cree M.D.   On: 10/25/2022 16:22               LOS: 2 days      [facility]   Pager on [email or web address]. If 7PM-7AM, please contact night-coverage at [email or web address]     10/27/2022, 2:49 PM

## 2022-10-27 NOTE — Progress Notes (Addendum)
Daily Progress Note  DOA: 10/25/2022 Hospital Day: 3  Chief Complaint: Recurrent anemia  ASSESSMENT & PLAN   Brief Narrative:  [name] is a 62 y.o. year old female with a history of  adenomatous colon polyps, chronic anemia, recurrent GI bleeds, severe esophagitis, PUD, gastrointestinal al AVMs, untreated HCV, Etoh abuse, ESRD on HD, hypertension, PAD, COPD   Recurrent severe anemia / FOBT+.  Hemoglobin 6.5, down from 8.8 in early June. Suspect anemia due in part to occult GI bleed from [facility] or recurrent PUD. Additionally she hasn't been taking daily oral iron as was previously prescribed.  -Hgb 8.6 after total of two units of blood -Repeat EGD / enteroscopy possibly once acute issues resolve -Continue PPI. She has no active bleeding. Will change from IV to PO -I stopped inpatient oral iron 8/10 to help eliminate any confusion should she have dark stools.    SIRS / acute hypoxic respiratory failure / CAP /COPD exacerbation.  - On IV Vancomycin and cefepime - Weaning down on 02 but still a lot of crackles in lungs / congested cough   ESRD on HD ( Hasn't been compliant)  MRSA + ( nasal swab)   ----------------------------------------------------------------------------------------------------    [facility] Attending   I have taken an interval history, reviewed the chart and examined the patient. I agree with the Advanced Practitioner's note, impression and recommendations.  Depending upon clinical course will consider endoscopic evaluation. If Hgb stays stable may defer until the next time as unlikely we will ever eliminate burden of AVM's and best we can do is manage with meds +/- endoscopy.  Iva Boop, MD, [facility] See Loretha Stapler on call - gastroenterology for best contact person 10/27/2022 12:23 PM   Subjective   Feels like she can't catch her breath.  No BMs or GI bleeding   Objective    Recent Labs    10/26/22 0622 10/26/22 0806  10/26/22 1816 10/27/22 0032 10/27/22 0911  WBC 4.4 5.6  --  6.0  --   HGB 5.5* 7.4* 7.8* 8.2* 8.6*  HCT 15.2* 21.2* 22.2* 24.1* 24.5*  PLT 153 189  --  PLATELET CLUMPS NOTED ON SMEAR, UNABLE TO ESTIMATE  --    BMET Recent Labs    10/25/22 2053 10/26/22 0806 10/27/22 0032  NA 130* 130* 129*  K 3.4* 2.8* 3.4*  CL 102 94* 91*  CO2 11* 22 19*  GLUCOSE 99 223* 142*  BUN 74* 25* 42*  CREATININE 4.69* 2.24* 2.93*  CALCIUM 7.8* 7.6* 8.0*   LFT Recent Labs    10/27/22 0032  PROT 5.2*  ALBUMIN 1.9*  AST 26  ALT 13  ALKPHOS 55  BILITOT 0.4   PT/INR No results for input(s): "LABPROT", "INR" in the last 72 hours.   Imaging:  CT ABDOMEN PELVIS WO CONTRAST CLINICAL DATA:  Shortness of breath and chest pain  EXAM: CT ABDOMEN AND PELVIS WITHOUT CONTRAST  TECHNIQUE: Multidetector CT imaging of the abdomen and pelvis was performed following the standard protocol without IV contrast.  RADIATION DOSE REDUCTION: This exam was performed according to the departmental dose-optimization program which includes automated exposure control, adjustment of the mA and/or kV according to patient size and/or use of iterative reconstruction technique.  COMPARISON:  CT abdomen and pelvis dated 12/31/2018  FINDINGS: Lower chest: Left lower lobe consolidation with air bronchograms. No pleural effusion or pneumothorax demonstrated. Partially imaged heart size is normal. Coronary artery calcifications.  Hepatobiliary: Subcentimeter segment 3 hypodensities,  too small to characterize, but unchanged from 12/31/2018. No intra or extrahepatic biliary ductal dilation. Normal gallbladder.  Pancreas: No focal lesions or main ductal dilation.  Spleen: Normal in size without focal abnormality.  Adrenals/Urinary Tract: No adrenal nodules. 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 No suspicious renal mass, calculi or hydronephrosis. Gas within the urinary bladder, which demonstrates mild  mural thickening and pericystic stranding.  Stomach/Bowel: Normal appearance of the stomach. No evidence of bowel wall thickening, distention, or inflammatory changes. Normal appendix.  Vascular/Lymphatic: Aortic atherosclerosis. No enlarged abdominal or pelvic lymph nodes.  Reproductive: No adnexal masses.  Other: Trace free fluid.  No fluid collection or free air.  Musculoskeletal: No acute or abnormal lytic or blastic osseous lesions. Multilevel degenerative changes of the partially imaged thoracic and lumbar spine.  IMPRESSION: 1. Left lower lobe consolidation with air bronchograms, suspicious for pneumonia. 2. Gas within the urinary bladder, which demonstrates mild mural thickening and pericystic stranding, suspicious for cystitis. Recommend correlation with urinalysis. 3. Aortic Atherosclerosis (ICD10-I70.0). Coronary artery calcifications. Assessment for potential risk factor modification, dietary therapy or pharmacologic therapy may be warranted, if clinically indicated.  Electronically Signed   By: Agustin Cree M.D.   On: 10/25/2022 16:22 DG Chest Portable 1 View CLINICAL DATA:  Shortness of breath  EXAM: PORTABLE CHEST 1 VIEW  COMPARISON:  Previous studies including the examination of 08/19/2022  FINDINGS: Transverse diameter of heart is slightly increased. Tip of right IJ dialysis catheter is seen in the region of superior vena cava. There is interval clearing of pulmonary vascular congestion and pulmonary edema. There is focal patchy infiltrate in right mid and right lower lung fields suggesting pneumonia. Lateral CP angles are clear. There is no pneumothorax.  IMPRESSION: There are patchy alveolar infiltrates in right mid and right lower lung fields suggesting pneumonia. There are no signs of alveolar pulmonary edema. There is no pleural effusion or pneumothorax.  Electronically Signed   By: [name] Avena M.D.   On: 10/25/2022 15:03      Scheduled inpatient medications:   sodium chloride   Intravenous Once   sodium chloride   Intravenous Once   atorvastatin  10 mg Oral Daily   Chlorhexidine Gluconate Cloth  6 each Topical Q0600   Chlorhexidine Gluconate Cloth  6 each Topical Q0600   [START ON 10/28/2022] darbepoetin (ARANESP) injection - DIALYSIS  100 mcg Subcutaneous Q Mon-1800   docusate sodium  100 mg Oral BID   gabapentin  100 mg Oral TID   guaiFENesin  600 mg Oral BID   methylPREDNISolone (SOLU-MEDROL) injection  80 mg Intravenous Daily   mupirocin ointment  1 Application Nasal BID   sodium bicarbonate  1,300 mg Oral TID   sodium chloride flush  3 mL Intravenous Q12H   vancomycin variable dose per unstable renal function (pharmacist dosing)   Does not apply See admin instructions   Continuous inpatient infusions:   sodium chloride     ceFEPime (MAXIPIME) IV 1 g (10/26/22 1711)   pantoprazole (PROTONIX) IV 80 mg (10/27/22 0920)   PRN inpatient medications: sodium chloride, acetaminophen **OR** acetaminophen, benzonatate, hydrALAZINE, levalbuterol, metoprolol tartrate, ondansetron **OR** ondansetron (ZOFRAN) IV, oxyCODONE-acetaminophen, sodium chloride flush  Vital signs in last 24 hours: Temp:  [97.5 F (36.4 C)-98.3 F (36.8 C)] 97.6 F (36.4 C) (08/11 0723) Pulse Rate:  [81-97] 89 (08/10 2334) Resp:  [16-20] 16 (08/11 0411) BP: (96-125)/(52-83) 125/66 (08/10 2334) SpO2:  [96 %-100 %] 98 % (08/10 2334) Weight:  [47.4 kg]  47.4 kg (08/11 0411) Last BM Date : 10/25/22  Intake/Output Summary (Last 24 hours) at 10/27/2022 1039 Last data filed at 10/27/2022 0849 Gross per 24 hour  Intake 775 ml  Output --  Net 775 ml    Intake/Output from previous day: 08/10 0701 - 08/11 0700 In: 821 [P.O.:360; I.V.:146; Blood:315] Out: -  Intake/Output this shift: Total I/O In: 100 [IV Piggyback:100] Out: -    Physical Exam:  General: Alert female in NAD Heart:  Regular rate and rhythm.  Pulmonary: Normal  respiratory effort. Bibasilar crackles. Congested cough Abdomen: Soft, nondistended, nontender. Normal bowel sounds. Neurologic: Alert and oriented Psych: Pleasant. Cooperative.    Principal Problem:   Right lower lobe pneumonia Active Problems:   Essential hypertension   Hepatitis C   Chronic disease anemia   GERD (gastroesophageal reflux disease)   Metabolic acidosis   Chronic hyponatremia   Chronic diastolic CHF (congestive heart failure) (HCC)   Acute on chronic blood loss anemia   Acute hypoxic respiratory failure (HCC)   ESRD on dialysis-noncompliant with outpatient dialysis due to homeless [facility])   History of arteriovenous malformation (AVM)   COPD with acute exacerbation (HCC)   Premature atrial complex due to COPD exacerbation and acute hypoxic respiratory failure   SIRS (systemic inflammatory response syndrome) (HCC)   Chronic pain syndrome     LOS: 2 days   Willette Cluster ,NP 10/27/2022, 10:39 AM

## 2022-10-28 DIAGNOSIS — N186 End stage renal disease: Secondary | ICD-10-CM | POA: Diagnosis not present

## 2022-10-28 DIAGNOSIS — R0602 Shortness of breath: Secondary | ICD-10-CM | POA: Diagnosis not present

## 2022-10-28 DIAGNOSIS — D649 Anemia, unspecified: Secondary | ICD-10-CM | POA: Diagnosis not present

## 2022-10-28 DIAGNOSIS — K31819 Angiodysplasia of stomach and duodenum without bleeding: Secondary | ICD-10-CM

## 2022-10-28 DIAGNOSIS — Z992 Dependence on renal dialysis: Secondary | ICD-10-CM | POA: Diagnosis not present

## 2022-10-28 DIAGNOSIS — J189 Pneumonia, unspecified organism: Secondary | ICD-10-CM | POA: Diagnosis not present

## 2022-10-28 DIAGNOSIS — Z8774 Personal history of (corrected) congenital malformations of heart and circulatory system: Secondary | ICD-10-CM | POA: Diagnosis not present

## 2022-10-28 DIAGNOSIS — K2289 Other specified disease of esophagus: Secondary | ICD-10-CM

## 2022-10-28 DIAGNOSIS — J9601 Acute respiratory failure with hypoxia: Secondary | ICD-10-CM | POA: Diagnosis not present

## 2022-10-28 LAB — CBC
HCT: 26 % — ABNORMAL LOW (ref 36.0–46.0)
Hemoglobin: 8.8 g/dL — ABNORMAL LOW (ref 12.0–15.0)
MCH: 30.7 pg (ref 26.0–34.0)
MCHC: 33.8 g/dL (ref 30.0–36.0)
MCV: 90.6 fL (ref 80.0–100.0)
Platelets: 192 10*3/uL (ref 150–400)
RBC: 2.87 MIL/uL — ABNORMAL LOW (ref 3.87–5.11)
RDW: 18.6 % — ABNORMAL HIGH (ref 11.5–15.5)
WBC: 7.9 10*3/uL (ref 4.0–10.5)
nRBC: 0.3 % — ABNORMAL HIGH (ref 0.0–0.2)

## 2022-10-28 LAB — GLUCOSE, CAPILLARY
Glucose-Capillary: 90 mg/dL (ref 70–99)
Glucose-Capillary: 85 mg/dL (ref 70–99)
Glucose-Capillary: 137 mg/dL — ABNORMAL HIGH (ref 70–99)
Glucose-Capillary: 143 mg/dL — ABNORMAL HIGH (ref 70–99)

## 2022-10-28 MED ORDER — VANCOMYCIN HCL 10 G IV SOLR
500.0000 mg | Freq: Once | INTRAVENOUS | Status: DC
  Filled 2022-10-28: qty 5

## 2022-10-28 MED ORDER — SODIUM CHLORIDE 0.9 % IV SOLN
500.0000 mg | INTRAVENOUS | Status: AC
  Administered 2022-10-28 – 2022-11-01 (×5): 500 mg via INTRAVENOUS
  Filled 2022-10-28 (×7): qty 10

## 2022-10-28 MED ORDER — BUSPIRONE HCL 10 MG PO TABS
10.0000 mg | ORAL_TABLET | Freq: Two times a day (BID) | ORAL | Status: DC
  Administered 2022-10-28 – 2022-11-02 (×11): 10 mg via ORAL
  Filled 2022-10-28 (×11): qty 1

## 2022-10-28 MED ORDER — REVEFENACIN 175 MCG/3ML IN SOLN
175.0000 ug | Freq: Every day | RESPIRATORY_TRACT | Status: DC
  Administered 2022-10-28 – 2022-10-31 (×3): 175 ug via RESPIRATORY_TRACT
  Filled 2022-10-28 (×3): qty 3

## 2022-10-28 MED ORDER — BUDESONIDE 0.5 MG/2ML IN SUSP
0.5000 mg | Freq: Two times a day (BID) | RESPIRATORY_TRACT | Status: DC
  Administered 2022-10-28 – 2022-10-31 (×5): 0.5 mg via RESPIRATORY_TRACT
  Filled 2022-10-28 (×5): qty 2

## 2022-10-28 MED ORDER — VANCOMYCIN HCL 500 MG IV SOLR
500.0000 mg | Freq: Once | INTRAVENOUS | Status: AC
  Administered 2022-10-28: 500 mg via INTRAVENOUS
  Filled 2022-10-28: qty 10

## 2022-10-28 MED ORDER — VANCOMYCIN HCL 500 MG/100ML IV SOLN
500.0000 mg | Freq: Once | INTRAVENOUS | Status: DC
  Filled 2022-10-28: qty 100

## 2022-10-28 MED ORDER — HEPARIN SODIUM (PORCINE) 1000 UNIT/ML IJ SOLN
INTRAMUSCULAR | Status: AC
  Filled 2022-10-28: qty 1

## 2022-10-28 MED ORDER — TRAZODONE HCL 50 MG PO TABS
50.0000 mg | ORAL_TABLET | Freq: Every evening | ORAL | Status: DC | PRN
  Administered 2022-10-28 – 2022-11-01 (×5): 50 mg via ORAL
  Filled 2022-10-28 (×5): qty 1

## 2022-10-28 MED ORDER — VANCOMYCIN HCL 500 MG/100ML IV SOLN: 500.0000 mg | Freq: Once | INTRAVENOUS | Status: DC

## 2022-10-28 MED ORDER — ARFORMOTEROL TARTRATE 15 MCG/2ML IN NEBU
15.0000 ug | INHALATION_SOLUTION | Freq: Two times a day (BID) | RESPIRATORY_TRACT | Status: DC
  Administered 2022-10-28 – 2022-10-31 (×5): 15 ug via RESPIRATORY_TRACT
  Filled 2022-10-28 (×5): qty 2

## 2022-10-28 MED ORDER — NICOTINE 21 MG/24HR TD PT24
21.0000 mg | MEDICATED_PATCH | Freq: Every day | TRANSDERMAL | Status: DC
  Administered 2022-10-28 – 2022-11-02 (×5): 21 mg via TRANSDERMAL
  Filled 2022-10-28 (×6): qty 1

## 2022-10-28 NOTE — Plan of Care (Signed)

## 2022-10-28 NOTE — Progress Notes (Signed)
[facility] Progress Note   Subjective:   Patient states she feels well today.  Had a lot of questions about dialysis.  We discussed why she needs dialysis and goals of treatment.  Objective Vitals:   10/28/22 0835 10/28/22 0840 10/28/22 0900 10/28/22 0930  BP: (!) 141/77 (!) 146/73 (!) 151/87 (!) 152/72  Pulse: 80 80 90 87  Resp: 16 16 14 19   Temp: (!) 97 F (36.1 C)     TempSrc:      SpO2: 100% 100% 98% 100%  Weight: 49.6 kg     Height:       Physical Exam General: Alert female, chronically ill appearing, NAD Heart: Normal rate, no rub Lungs: Bilateral chest rise with no increased work of breathing Abdomen: Soft, non-distended, +BS Extremities: No edema b/l lower extremities Dialysis Access:  TDC, RUE AVF +bruit  Additional Objective Labs: Basic Metabolic Panel: Recent Labs  Lab 10/25/22 2053 10/26/22 0806 10/27/22 0032 10/28/22 0032  NA 130* 130* 129* 129*  K 3.4* 2.8* 3.4* 3.6  CL 102 94* 91* 94*  CO2 11* 22 19* 19*  GLUCOSE 99 223* 142* 122*  BUN 74* 25* 42* 70*  CREATININE 4.69* 2.24* 2.93* 4.21*  CALCIUM 7.8* 7.6* 8.0* 7.8*  PHOS 5.8*  --   --   --    Liver Function Tests: Recent Labs  Lab 10/25/22 1351 10/25/22 2053 10/27/22 0032 10/28/22 0032  AST 23  --  26 33  ALT 15  --  13 15  ALKPHOS 76  --  55 48  BILITOT 0.6  --  0.4 0.7  PROT 6.3*  --  5.2* 5.5*  ALBUMIN 2.5* 2.4* 1.9* 1.9*   Recent Labs  Lab 10/25/22 1351  LIPASE 41   CBC: Recent Labs  Lab 10/25/22 1351 10/25/22 1354 10/25/22 2053 10/26/22 0622 10/26/22 0806 10/26/22 1816 10/27/22 0032 10/27/22 0911 10/27/22 1738 10/28/22 0032 10/28/22 0741  WBC 12.5*  --  9.1 4.4 5.6  --  6.0  --   --  7.2 7.9  NEUTROABS 11.4*  --  8.2*  --   --   --   --   --   --   --   --   HGB 6.5*   < > 8.1* 5.5* 7.4*   < > 8.2*   < > 9.4* 7.8* 8.8*  HCT 19.3*   < > 24.3* 15.2* 21.2*   < > 24.1*   < > 27.7* 23.1* 26.0*  MCV 100.0  --  94.9 90.5 91.8  --  92.7  --   --  90.6 90.6   PLT 278  --  248 153 189  --  PLATELET CLUMPS NOTED ON SMEAR, UNABLE TO ESTIMATE  --   --  168 192   < > = values in this interval not displayed.   Blood Culture    Component Value Date/Time   SDES URINE, CLEAN CATCH 10/25/2022 2015   SPECREQUEST  10/25/2022 2015    NONE Reflexed from [id number] Performed at [facility], [address], [place], [place] [address]    CULT (A) 10/25/2022 2015    >=100,000 COLONIES/mL KLEBSIELLA PNEUMONIAE Confirmed Extended Spectrum Beta-Lactamase Producer (ESBL).  In bloodstream infections from ESBL organisms, carbapenems are preferred over piperacillin/tazobactam. They are shown to have a lower risk of mortality.    REPTSTATUS 10/27/2022 FINAL 10/25/2022 2015    Cardiac Enzymes: No results for input(s): "CKTOTAL", "CKMB", "CKMBINDEX", "TROPONINI" in the last 168 hours. CBG:  Recent Labs  Lab 10/27/22 0601 10/27/22 1139 10/27/22 1611 10/27/22 2116 10/28/22 0632  GLUCAP 111* 116* 133* 136* 90   Iron Studies:  Recent Labs    10/25/22 2053  IRON 7*  TIBC 200*  FERRITIN 187   @[email or web address] @ Studies/Results: No results found. Medications:  sodium chloride     vancomycin      sodium chloride   Intravenous Once   sodium chloride   Intravenous Once   atorvastatin  10 mg Oral Daily   Chlorhexidine Gluconate Cloth  6 each Topical Q0600   Chlorhexidine Gluconate Cloth  6 each Topical Q0600   Chlorhexidine Gluconate Cloth  6 each Topical Q0600   darbepoetin (ARANESP) injection - DIALYSIS  100 mcg Subcutaneous Q Mon-1800   docusate sodium  100 mg Oral BID   gabapentin  100 mg Oral TID   guaiFENesin  600 mg Oral BID   methylPREDNISolone (SOLU-MEDROL) injection  80 mg Intravenous Daily   mupirocin ointment  1 Application Nasal BID   nicotine  21 mg Transdermal Daily   pantoprazole  40 mg Oral BID   sodium bicarbonate  1,300 mg Oral TID   sodium chloride flush  3 mL Intravenous Q12H   vancomycin variable dose per unstable renal function  (pharmacist dosing)   Does not apply See admin instructions    OP Dialysis Orders: Previously at [facility], unable to obtain records- may have been discharged from clinic   Assessment/Plan: Missed dialysis / ESRD- She reports she missed 1 month of dialysis but was feeling well until this week.  Requirements for dialysis on arrival were acidosis and volume overload.  Trying to get established back in outpatient dialysis with social work.   Acute hypoxic respiratory failure: Possibly pneumonia but also volume overloaded.  Has improved.  Ultrafiltration with dialysis. Metabolic acidosis: Related to renal failure.  Improved with dialysis and supplementation HTN/ volume -improved with dialysis and volume removal Anemia esrd - Hb 8.8 s/p PRBC transfusion. GI onboard. ESA ordered here. Iron very low but hard to interpret given she had transfusion. Holding iron given possible infection. Possible GI bleed eval once she stabilizes. MBD ckd - CCa in range, phos 5.8- if remains elevated will need to start a binder Dialysis access- Has [facility], looks like she missed calls from vascular surgery to schedule second stage surgery, will need to follow up outpatient vs consult [facility] if stay is prolonged.

## 2022-10-28 NOTE — Progress Notes (Signed)
   10/28/22 1229  Vitals  Temp 98.2 F (36.8 C)  Pulse Rate 80  Resp 13  BP 124/68  SpO2 100 %  O2 Device Nasal Cannula  Oxygen Therapy  O2 Flow Rate (L/min) 3 L/min  Patient Activity (if Appropriate) In bed  Pulse Oximetry Type Continuous  Oximetry Probe Site Changed No  Post Treatment  Dialyzer Clearance Clotted  Duration of HD Treatment -hour(s) 3.46 hour(s)  Hemodialysis Intake (mL) 0 mL  Liters Processed 83  Fluid Removed (mL) 2082.78 mL  Tolerated HD Treatment Yes   Received patient in bed to unit.  Alert and oriented.  Informed consent signed and in chart.   TX duration:3 hr 28 minutes  Patient tolerated well.  Transported back to the room  Alert, without acute distress.  Hand-off given to patient's nurse.   Access used: [facility] Access issues: none  Total UF removed: 2.1L Medication(s) given: none    [facility]

## 2022-10-28 NOTE — Progress Notes (Signed)
Patient ID: [name], female   DOB: 02/20/1961, 62 y.o.   MRN: [id number]    Progress Note   Subjective   Day #4 CC; shortness of breath, recurrent anemia, heme positive-in setting of end-stage renal disease on dialysis, previously documented AVMs Hemoglobin 6.5 on admit down from 8.8 in June 2024-Siv 2 units packed RBCs-no overt bleeding at the time of admission  IV meropenem  Status post dialysis today  Labs-WBC 7.2/hemoglobin 7.8/hematocrit 23.1-drifted BUN 70/creatinine 4.2 Urine culture positive for ESBL Klebsiella Blood cultures negative/Staph epidermidis methicillin-resistant 1 out of 4 bottles  Patient tearful and upset during my visit, she is distressed about being homeless and not being able to get help from social services regarding any potential shelters or housing options She has not been aware of any melena or hematochezia, no complaints of abdominal pain, she does feel that her breathing has improved since admission but still short of breath with exertion, and has a wet cough     Objective   Vital signs in last 24 hours: Temp:  [97 F (36.1 C)-98.2 F (36.8 C)] 98.2 F (36.8 C) (08/12 1229) Pulse Rate:  [68-106] 80 (08/12 1229) Resp:  [12-23] 13 (08/12 1229) BP: (108-155)/(56-87) 124/68 (08/12 1229) SpO2:  [96 %-100 %] 98 % (08/12 1356) Weight:  [49.2 kg-49.6 kg] 49.6 kg (08/12 0835) Last BM Date : 10/25/22 General: Older white female in NAD, thin Heart:  Regular rate and rhythm; no murmurs Lungs: Respirations even and unlabored, few scattered basilar rhonchi Abdomen:  Soft, nontender and nondistended. Normal bowel sounds. Extremities:  Without edema. Neurologic:  Alert and oriented,  grossly normal neurologically. Psych:  Cooperative. Normal mood and affect.  Intake/Output from previous day: 08/11 0701 - 08/12 0700 In: 340 [P.O.:240; IV Piggyback:100] Out: -  Intake/Output this shift: Total I/O In: -  Out: 2082.8 [Other:2082.8]  Lab  Results: Recent Labs    10/27/22 0032 10/27/22 0911 10/27/22 1738 10/28/22 0032 10/28/22 0741  WBC 6.0  --   --  7.2 7.9  HGB 8.2*   < > 9.4* 7.8* 8.8*  HCT 24.1*   < > 27.7* 23.1* 26.0*  PLT PLATELET CLUMPS NOTED ON SMEAR, UNABLE TO ESTIMATE  --   --  168 192   < > = values in this interval not displayed.   BMET Recent Labs    10/26/22 0806 10/27/22 0032 10/28/22 0032  NA 130* 129* 129*  K 2.8* 3.4* 3.6  CL 94* 91* 94*  CO2 22 19* 19*  GLUCOSE 223* 142* 122*  BUN 25* 42* 70*  CREATININE 2.24* 2.93* 4.21*  CALCIUM 7.6* 8.0* 7.8*   LFT Recent Labs    10/28/22 0032  PROT 5.5*  ALBUMIN 1.9*  AST 33  ALT 15  ALKPHOS 48  BILITOT 0.7   PT/INR No results for input(s): "LABPROT", "INR" in the last 72 hours.       Assessment / Plan:    #56 62 year old white female admitted with shortness of breath in setting of end-stage renal disease, COPD, peripheral arterial disease and history of recurrent anemia, and homelessness. Found to have hemoglobin of 6.5 on admission and heme positive without any evidence of overt GI bleeding.  She does have previously documented intestinal AVMs.  Transfused 2 units of packed RBCs since admission Had enteroscopy 08/16/2022-mild gastritis, previously biopsied and H. pylori negative, there were 6 small AVMs in the gastric cardia and body treated with APC and a single AVM noted in the duodenal bulb treated  with APC IV iron and Epogen also recommended during that admission  Suspect the anemia may be multifactorial secondary to her end-stage renal disease, and component of chronic intermittent GI blood loss from AVM  #2 end-stage renal disease on dialysis, has been noncompliant recently, had dialysis today Patient says she was scheduled for an outpatient procedure to have an access graft changed not sure she could be seen while she was admitted.  #3 right lower lobe pneumonia-met SIRS criteria on admission #4 COPD exacerbation secondary to  above #5 UTI-ESBL Klebsiella #6 mild chronic hyponatremia  Plan; continue daily PPI Continue to trend hemoglobin daily and transfuse for hemoglobin less than 7 IV iron, if not given earlier this admission Erythropoietin as per nephrology We will plan for EGD/enteroscopy prior to discharge, but would like to allow her a few more days to continue to improve from a pulmonary standpoint prior to considering sedation.  Principal Problem:   Right lower lobe pneumonia Active Problems:   Essential hypertension   Hepatitis C   Chronic disease anemia   GERD (gastroesophageal reflux disease)   Metabolic acidosis   Chronic hyponatremia   Chronic diastolic CHF (congestive heart failure) (HCC)   Acute on chronic blood loss anemia   Acute hypoxic respiratory failure (HCC)   ESRD on dialysis-noncompliant with outpatient dialysis due to homeless [facility])   History of arteriovenous malformation (AVM)   COPD with acute exacerbation (HCC)   Premature atrial complex due to COPD exacerbation and acute hypoxic respiratory failure   SIRS (systemic inflammatory response syndrome) (HCC)   Chronic pain syndrome   Angiodysplasia of upper gastrointestinal tract     LOS: 3 days     PA-C 10/28/2022, 3:38 PM

## 2022-10-28 NOTE — Progress Notes (Signed)
Requested to see pt for out-pt HD needs at d/c. Met with pt while pt receiving HD this am. Introduced self and explained role. Pt has a hx at [facility] [place] but pt d/c from clinic due to not coming to appts. Pt states she has issues with stable housing and that transportation from some place she was staying was an issue as well. Pt unsure where she will go at d/c but states she has to stay in the [place] area. Referral submitted to [facility] admissions for review for a new clinic in [place]. Case discussed with RN CM and CSW as well. Will assist as needed.   Olivia Canter Renal Navigator [phone]

## 2022-10-28 NOTE — Progress Notes (Signed)
Pharmacy Antibiotic Note  [name] is a 62 y.o. female for which pharmacy has been consulted for vancomycin and meropenem dosing for pneumonia.  Patient with a history of ESRD on HD, HTN, PAD, GED, anemia, COPD, hepatitis C, HLD, hx of GIB. Patient presenting with SOB and chest pain. CXR concerning for pna. On 8/12 WBC 7.9 and afebrile.  Plan: No standing Vancomycin - continue to monitor HD plans Vancomycin levels when indicated Start Meropenem 500 mg Q24h, plan to give in evenings after HD   Height: 5\' 7"  (170.2 cm) Weight: 49.6 kg (109 lb 5.6 oz) IBW/kg (Calculated) : 61.6  Temp (24hrs), Avg:97.7 F (36.5 C), Min:97 F (36.1 C), Max:98.1 F (36.7 C)  Recent Labs  Lab 10/25/22 1341 10/25/22 1342 10/25/22 1351 10/25/22 2017 10/25/22 2053 10/26/22 0622 10/26/22 0806 10/27/22 0032 10/28/22 0032 10/28/22 0741  WBC   < >  --  12.5*  --  9.1 4.4 5.6 6.0 7.2 7.9  CREATININE  --   --  4.80*  --  4.69*  --  2.24* 2.93* 4.21*  --   LATICACIDVEN  --  1.6  --  1.0  --   --   --   --   --   --    < > = values in this interval not displayed.    Estimated Creatinine Clearance: 10.8 mL/min (A) (by C-G formula based on SCr of 4.21 mg/dL (H)).    Allergies  Allergen Reactions   Zestril [Lisinopril] Other (See Comments)    Hyperkalemia   Antimicrobials this admission: 8/9 cefepime >> 8/12  8/9 vancomycin >>  8/12 meropenem >>   Microbiology results: 8/9 Ucx: Klebsiella pneum. ESBL, sensitive to imipenem & pip/tazo 8/9 Bcx:No growth in 3 days 8/10 MRSA PCR: detected  Francetta Found, PharmD Candidate Class of 2025  10/28/2022 10:44 AM
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
## 2022-10-28 NOTE — Progress Notes (Signed)
PROGRESS NOTE  [name] WNU:[id number] DOB: [birth year]   PCP: [name], MD  Patient is from: Homeless  DOA: 10/25/2022 LOS: 3  Chief complaints Chief Complaint  Patient presents with   Shortness of Breath     Brief Narrative / Interim history: 62 year old F with PMH of ESRD noncompliant with HD (missed HD for over a month), COPD, PAD, GERD, GIB, AVM, anemia, HLD, hep C, tobacco use disorder and homelessness presenting with shortness of breath and chest pain, and admitted for COPD exacerbation and sepsis due to bilateral pneumonia.  Started on steroid, antibiotics and nebulizers.  Urine culture with ESBL Klebsiella.  She was initially on IV cefepime and switched to IV meropenem after urine culture sensitivity.  Hemoglobin was 5.5 on admission.  Hemoccult was positive.  She was transfused 2 units with improvement.  GI consulted and considering EGD/capsule endoscopy down the road.  Needs outpatient dialysis chair as well.   Subjective: Seen and examined earlier this morning while on dialysis.  No major events overnight of this morning.  Complains cough and chest pain with cough.  She also reports anxiety and asking for anxiety medication.  Reports dark stool but not able to tell if it is blood or iron.  Objective: Vitals:   10/28/22 1100 10/28/22 1130 10/28/22 1200 10/28/22 1227  BP: (!) 141/80 (!) 110/57 131/63 129/66  Pulse: 98 (!) 106 84 92  Resp: 14 19 (!) 23 12  Temp:    98.2 F (36.8 C)  TempSrc:      SpO2: 99% 99% 99% 100%  Weight:      Height:        Examination:  GENERAL: Appears frail.  No apparent distress. HEENT: MMM.  Vision and hearing grossly intact.  NECK: Supple.  No apparent JVD.  RESP:  No IWOB.  Fair aeration bilaterally.  Coughs intermittently. CVS:  RRR. Heart sounds normal.  ABD/GI/GU: BS+. Abd soft, NTND.  MSK/EXT:  Moves extremities.  Significant muscle mass and subcu fat loss.  None SKIN: no apparent skin lesion or wound NEURO:  Awake, alert and oriented appropriately.  No apparent focal neuro deficit. PSYCH: Calm. Normal affect.   Procedures:  None  Microbiology summarized: 8/9-full RVP nonreactive 8/10-MRSA PCR screen positive 8/9-blood culture with staph epidermis with methicillin resistance in 1 out of 4 bottles. 8/9-urine culture with ESBL Klebsiella pneumonia.  Assessment and plan: Principal Problem:   Right lower lobe pneumonia Active Problems:   Metabolic acidosis   Chronic hyponatremia   Acute on chronic blood loss anemia   Acute hypoxic respiratory failure (HCC)   ESRD on dialysis-noncompliant with outpatient dialysis due to homeless [facility])   History of arteriovenous malformation (AVM)   COPD with acute exacerbation (HCC)   SIRS (systemic inflammatory response syndrome) (HCC)   Essential hypertension   Hepatitis C   Chronic disease anemia   GERD (gastroesophageal reflux disease)   Chronic diastolic CHF (congestive heart failure) (HCC)   Premature atrial complex due to COPD exacerbation and acute hypoxic respiratory failure   Chronic pain syndrome   Angiodysplasia of upper gastrointestinal tract  Sepsis secondary to community-acquired pneumonia, bilateral pneumonia: Culture data as above. -Continue vancomycin with HD for total of 7 days -Change cefepime to meropenem given ESBL Klebsiella pneumonia in urine. -Continue Solu-Medrol, nebulizers and pulmonary toilet.   COPD exacerbation: Still with significant cough.  Smokes about half to 1 pack a day.  Doubt compliance with inhalers. -Continue Solu-Medrol -Antibiotics as above -Add Brovana, Pulmicort and  Mikael Spray -Continue Xopenex as needed -Continue mucolytic's and antitussive -Encouraged smoking cessation. -Wean oxygen.   Acute hypoxic respiratory failure: Slowly improving.  She has been weaned down from 10 L/min to 3 L/min oxygen via [place]. -Manage pneumonia and COPD as above -Wean oxygen as able.   ESBL UTI: Urine culture with ESBL  Klebsiella pneumonia. -Change IV cefepime to meropenem.  Treat for 3 days. Severe anemia: Hemoglobin is up to 8.3.  S/p transfusion with 1 unit of PRBCs on 10/25/2022.  S/p transfusion with 1 unit of PRBCs on 10/26/2022..     ESRD/metabolic acidosis/hyponatremia: Reportedly has not had dialysis in over a month.  She is homeless and has no phone.  Makes urine.  Seems like she is on Lasix at home. -Nephrology on board   Positive blood culture: Blood culture with staph epidermis with methicillin-resistant in 1 out of 4 bottles likely contaminant.   Acute on chronic blood loss anemia/GI bleed: history of recurrent GI bleed, history of AVM: H&H stable after 2 units. Recent Labs    10/25/22 2053 10/26/22 0622 10/26/22 0806 10/26/22 1816 10/27/22 0032 10/27/22 0911 10/27/22 1340 10/27/22 1738 10/28/22 0032 10/28/22 0741  HGB 8.1* 5.5* 7.4* 7.8* 8.2* 8.6* 8.3* 9.4* 7.8* 8.8*  -Continue monitoring -Aranesp per nephrology -Continue Protonix -GI following    Hypokalemia:  -Monitor and replenish as appropriate.   Homelessness: Follow-up with social worker to assist with disposition.   Anxiety: Reports feeling anxious.  Could be exacerbated by steroid. -Continue gabapentin -Resume home BuSpar -Wean off steroid as quick as possible  Tobacco use disorder: Reports cutting down to half a pack a day recently due to shortness of breath. -Encourage cessation -Nicotine patch  Homelessness: -TOC on board.  Severe malnutrition Body mass index is 17.13 kg/m. -Consult dietitian          DVT prophylaxis:  SCDs Start: 10/25/22 2022  Code Status: Full code Family Communication: None at bedside Level of care: Progressive Status is: Inpatient Remains inpatient appropriate because: Pneumonia, COPD exacerbation, GI bleed and ESRD   Final disposition: TBD Consultants:  Nephrology Gastroenterology  55 minutes with more than 50% spent in reviewing records, counseling patient/family  and coordinating care.   Sch Meds:  Scheduled Meds:  sodium chloride   Intravenous Once   sodium chloride   Intravenous Once   arformoterol  15 mcg Nebulization BID   atorvastatin  10 mg Oral Daily   budesonide (PULMICORT) nebulizer solution  0.5 mg Nebulization BID   busPIRone  10 mg Oral BID   Chlorhexidine Gluconate Cloth  6 each Topical Q0600   Chlorhexidine Gluconate Cloth  6 each Topical Q0600   Chlorhexidine Gluconate Cloth  6 each Topical Q0600   darbepoetin (ARANESP) injection - DIALYSIS  100 mcg Subcutaneous Q Mon-1800   docusate sodium  100 mg Oral BID   gabapentin  100 mg Oral TID   guaiFENesin  600 mg Oral BID   heparin sodium (porcine)       methylPREDNISolone (SOLU-MEDROL) injection  80 mg Intravenous Daily   mupirocin ointment  1 Application Nasal BID   nicotine  21 mg Transdermal Daily   pantoprazole  40 mg Oral BID   revefenacin  175 mcg Nebulization Daily   sodium bicarbonate  1,300 mg Oral TID   sodium chloride flush  3 mL Intravenous Q12H   vancomycin variable dose per unstable renal function (pharmacist dosing)   Does not apply See admin instructions   Continuous Infusions:  sodium chloride  meropenem (MERREM) IV     vancomycin     PRN Meds:.sodium chloride, acetaminophen **OR** acetaminophen, benzonatate, heparin sodium (porcine), hydrALAZINE, levalbuterol, metoprolol tartrate, ondansetron **OR** ondansetron (ZOFRAN) IV, oxyCODONE-acetaminophen, sodium chloride flush, traZODone  Antimicrobials: Anti-infectives (From admission, onward)    Start     Dose/Rate Route Frequency Ordered Stop   10/28/22 1800  meropenem (MERREM) 500 mg in sodium chloride 0.9 % 100 mL IVPB        500 mg 200 mL/hr over 30 Minutes Intravenous Every 24 hours 10/28/22 1156     10/28/22 1200  vancomycin (VANCOREADY) IVPB 500 mg/100 mL        500 mg 100 mL/hr over 60 Minutes Intravenous Once in dialysis 10/28/22 0832     10/26/22 1800  ceFEPIme (MAXIPIME) 1 g in sodium chloride  0.9 % 100 mL IVPB  Status:  Discontinued        1 g 200 mL/hr over 30 Minutes Intravenous Every 24 hours 10/25/22 2111 10/28/22 0733   10/26/22 1530  vancomycin (VANCOREADY) IVPB 500 mg/100 mL        500 mg 100 mL/hr over 60 Minutes Intravenous  Once 10/26/22 1442 10/26/22 1709   10/25/22 2115  vancomycin variable dose per unstable renal function (pharmacist dosing)         Does not apply See admin instructions 10/25/22 2115     10/25/22 1815  ceFEPIme (MAXIPIME) 1 g in sodium chloride 0.9 % 100 mL IVPB        1 g 200 mL/hr over 30 Minutes Intravenous  Once 10/25/22 1806 10/25/22 1925   10/25/22 1800  vancomycin (VANCOCIN) IVPB 1000 mg/200 mL premix        1,000 mg 200 mL/hr over 60 Minutes Intravenous  Once 10/25/22 1757 10/25/22 2003        I have personally reviewed the following labs and images: CBC: Recent Labs  Lab 10/25/22 1351 10/25/22 1354 10/25/22 2053 10/26/22 0622 10/26/22 0806 10/26/22 1816 10/27/22 0032 10/27/22 0911 10/27/22 1340 10/27/22 1738 10/28/22 0032 10/28/22 0741  WBC 12.5*  --  9.1 4.4 5.6  --  6.0  --   --   --  7.2 7.9  NEUTROABS 11.4*  --  8.2*  --   --   --   --   --   --   --   --   --   HGB 6.5*   < > 8.1* 5.5* 7.4*   < > 8.2* 8.6* 8.3* 9.4* 7.8* 8.8*  HCT 19.3*   < > 24.3* 15.2* 21.2*   < > 24.1* 24.5* 23.7* 27.7* 23.1* 26.0*  MCV 100.0  --  94.9 90.5 91.8  --  92.7  --   --   --  90.6 90.6  PLT 278  --  248 153 189  --  PLATELET CLUMPS NOTED ON SMEAR, UNABLE TO ESTIMATE  --   --   --  168 192   < > = values in this interval not displayed.   BMP &GFR Recent Labs  Lab 10/25/22 1351 10/25/22 1354 10/25/22 2053 10/26/22 0806 10/26/22 1810 10/27/22 0032 10/28/22 0032  NA 128* 129* 130* 130*  --  129* 129*  K 3.5 3.5 3.4* 2.8*  --  3.4* 3.6  CL 102  --  102 94*  --  91* 94*  CO2 9*  --  11* 22  --  19* 19*  GLUCOSE 107*  --  99 223*  --  142* 122*  BUN  75*  --  74* 25*  --  42* 70*  CREATININE 4.80*  --  4.69* 2.24*  --  2.93* 4.21*   CALCIUM 8.0*  --  7.8* 7.6*  --  8.0* 7.8*  MG 1.6*  --   --   --  1.8 2.0  --   PHOS  --   --  5.8*  --   --   --   --    Estimated Creatinine Clearance: 10.8 mL/min (A) (by C-G formula based on SCr of 4.21 mg/dL (H)). Liver & Pancreas: Recent Labs  Lab 10/25/22 1351 10/25/22 2053 10/27/22 0032 10/28/22 0032  AST 23  --  26 33  ALT 15  --  13 15  ALKPHOS 76  --  55 48  BILITOT 0.6  --  0.4 0.7  PROT 6.3*  --  5.2* 5.5*  ALBUMIN 2.5* 2.4* 1.9* 1.9*   Recent Labs  Lab 10/25/22 1351  LIPASE 41   No results for input(s): "AMMONIA" in the last 168 hours. Diabetic: Recent Labs    10/25/22 2053  HGBA1C 4.6*   Recent Labs  Lab 10/27/22 0601 10/27/22 1139 10/27/22 1611 10/27/22 2116 10/28/22 0632  GLUCAP 111* 116* 133* 136* 90   Cardiac Enzymes: No results for input(s): "CKTOTAL", "CKMB", "CKMBINDEX", "TROPONINI" in the last 168 hours. No results for input(s): "PROBNP" in the last 8760 hours. Coagulation Profile: No results for input(s): "INR", "PROTIME" in the last 168 hours. Thyroid Function Tests: No results for input(s): "TSH", "T4TOTAL", "FREET4", "T3FREE", "THYROIDAB" in the last 72 hours. Lipid Profile: Recent Labs    10/25/22 1351  CHOL 135  HDL 43  LDLCALC 70  TRIG 111  CHOLHDL 3.1   Anemia Panel: Recent Labs    10/25/22 1351 10/25/22 2053  VITAMINB12 420  --   FOLATE  --  8.1  FERRITIN  --  187  TIBC  --  200*  IRON  --  7*  RETICCTPCT  --  2.2   Urine analysis:    Component Value Date/Time   COLORURINE YELLOW 10/25/2022 2015   APPEARANCEUR HAZY (A) 10/25/2022 2015   LABSPEC 1.009 10/25/2022 2015   PHURINE 5.0 10/25/2022 2015   GLUCOSEU 50 (A) 10/25/2022 2015   HGBUR LARGE (A) 10/25/2022 2015   BILIRUBINUR NEGATIVE 10/25/2022 2015   KETONESUR NEGATIVE 10/25/2022 2015   PROTEINUR 100 (A) 10/25/2022 2015   UROBILINOGEN 0.2 03/02/2010 2027   NITRITE POSITIVE (A) 10/25/2022 2015   LEUKOCYTESUR MODERATE (A) 10/25/2022 2015   Sepsis  Labs: Invalid input(s): "PROCALCITONIN", "LACTICIDVEN"  Microbiology: Recent Results (from the past 240 hour(s))  Blood culture (routine x 2)     Status: Abnormal (Preliminary result)   Collection Time: 10/25/22  1:51 PM   Specimen: BLOOD  Result Value Ref Range Status   Specimen Description BLOOD LEFT ANTECUBITAL  Final   Special Requests   Final    BOTTLES DRAWN AEROBIC ONLY Blood Culture results may not be optimal due to an excessive volume of blood received in culture bottles   Culture  Setup Time   Final    GRAM POSITIVE COCCI GRAM POSITIVE RODS AEROBIC BOTTLE ONLY CRITICAL RESULT CALLED TO, READ BACK BY AND VERIFIED WITH: PHARMD MADISON OWEN [id number] 1656 BY J RAZZAK, MT    Culture (A)  Final    STAPHYLOCOCCUS EPIDERMIDIS THE SIGNIFICANCE OF ISOLATING THIS ORGANISM FROM A SINGLE SET OF BLOOD CULTURES WHEN MULTIPLE SETS ARE DRAWN IS UNCERTAIN. PLEASE NOTIFY THE MICROBIOLOGY DEPARTMENT WITHIN ONE  WEEK IF SPECIATION AND SENSITIVITIES ARE REQUIRED. GRAM POSITIVE RODS IDENTIFICATION TO FOLLOW Performed at [facility], [address], [place], [place] [address]    Report Status PENDING  Incomplete  Blood Culture ID Panel (Reflexed)     Status: Abnormal   Collection Time: 10/25/22  1:51 PM  Result Value Ref Range Status   Enterococcus faecalis NOT DETECTED NOT DETECTED Final   Enterococcus Faecium NOT DETECTED NOT DETECTED Final   Listeria monocytogenes NOT DETECTED NOT DETECTED Final   Staphylococcus species DETECTED (A) NOT DETECTED Final    Comment: CRITICAL RESULT CALLED TO, READ BACK BY AND VERIFIED WITH: PHARMD MADISON OWEN [id number] 1656 BY J RAZZAK, MT    Staphylococcus aureus (BCID) NOT DETECTED NOT DETECTED Final   Staphylococcus epidermidis DETECTED (A) NOT DETECTED Final    Comment: Methicillin (oxacillin) resistant coagulase negative staphylococcus. Possible blood culture contaminant (unless isolated from more than one blood culture draw or clinical case  suggests pathogenicity). No antibiotic treatment is indicated for blood  culture contaminants. CRITICAL RESULT CALLED TO, READ BACK BY AND VERIFIED WITH: PHARMD MADISON Cornelius Moras [phone] 1656 BY J RAZZAK,MT    Staphylococcus lugdunensis NOT DETECTED NOT DETECTED Final   Streptococcus species NOT DETECTED NOT DETECTED Final   Streptococcus agalactiae NOT DETECTED NOT DETECTED Final   Streptococcus pneumoniae NOT DETECTED NOT DETECTED Final   Streptococcus pyogenes NOT DETECTED NOT DETECTED Final   A.calcoaceticus-baumannii NOT DETECTED NOT DETECTED Final   Bacteroides fragilis NOT DETECTED NOT DETECTED Final   Enterobacterales NOT DETECTED NOT DETECTED Final   Enterobacter cloacae complex NOT DETECTED NOT DETECTED Final   Escherichia coli NOT DETECTED NOT DETECTED Final   Klebsiella aerogenes NOT DETECTED NOT DETECTED Final   Klebsiella oxytoca NOT DETECTED NOT DETECTED Final   Klebsiella pneumoniae NOT DETECTED NOT DETECTED Final   Proteus species NOT DETECTED NOT DETECTED Final   Salmonella species NOT DETECTED NOT DETECTED Final   Serratia marcescens NOT DETECTED NOT DETECTED Final   Haemophilus influenzae NOT DETECTED NOT DETECTED Final   Neisseria meningitidis NOT DETECTED NOT DETECTED Final   Pseudomonas aeruginosa NOT DETECTED NOT DETECTED Final   Stenotrophomonas maltophilia NOT DETECTED NOT DETECTED Final   Candida albicans NOT DETECTED NOT DETECTED Final   Candida auris NOT DETECTED NOT DETECTED Final   Candida glabrata NOT DETECTED NOT DETECTED Final   Candida krusei NOT DETECTED NOT DETECTED Final   Candida parapsilosis NOT DETECTED NOT DETECTED Final   Candida tropicalis NOT DETECTED NOT DETECTED Final   Cryptococcus neoformans/gattii NOT DETECTED NOT DETECTED Final   Methicillin resistance mecA/C DETECTED (A) NOT DETECTED Final    Comment: CRITICAL RESULT CALLED TO, READ BACK BY AND VERIFIED WITH: PHARMD MADISON OWEN [id number] 1656 BY Berline Chough, MT Performed at [facility], [address] [place], [place] [address]   Blood culture (routine x 2)     Status: None (Preliminary result)   Collection Time: 10/25/22  3:03 PM   Specimen: BLOOD LEFT FOREARM  Result Value Ref Range Status   Specimen Description BLOOD LEFT FOREARM  Final   Special Requests   Final    BOTTLES DRAWN AEROBIC AND ANAEROBIC Blood Culture results may not be optimal due to an excessive volume of blood received in culture bottles   Culture   Final    NO GROWTH 3 DAYS Performed at [facility], [address] [place], [place] [address]    Report Status PENDING  Incomplete  Urine Culture  Status: Abnormal   Collection Time: 10/25/22  8:15 PM   Specimen: Urine, Clean Catch  Result Value Ref Range Status   Specimen Description URINE, CLEAN CATCH  Final   Special Requests   Final    NONE Reflexed from [id number] Performed at [facility], [address], [place], [place] [address]    Culture (A)  Final    >=100,000 COLONIES/mL KLEBSIELLA PNEUMONIAE Confirmed Extended Spectrum Beta-Lactamase Producer (ESBL).  In bloodstream infections from ESBL organisms, carbapenems are preferred over piperacillin/tazobactam. They are shown to have a lower risk of mortality.    Report Status 10/27/2022 FINAL  Final   Organism ID, Bacteria KLEBSIELLA PNEUMONIAE (A)  Final      Susceptibility   Klebsiella pneumoniae - MIC*    AMPICILLIN >=32 RESISTANT Resistant     CEFAZOLIN >=64 RESISTANT Resistant     CEFEPIME >=32 RESISTANT Resistant     CEFTRIAXONE >=64 RESISTANT Resistant     CIPROFLOXACIN 0.5 INTERMEDIATE Intermediate     GENTAMICIN >=16 RESISTANT Resistant     IMIPENEM <=0.25 SENSITIVE Sensitive     NITROFURANTOIN 128 RESISTANT Resistant     TRIMETH/SULFA >=320 RESISTANT Resistant     AMPICILLIN/SULBACTAM >=32 RESISTANT Resistant     PIP/TAZO <=4 SENSITIVE Sensitive     * >=100,000 COLONIES/mL KLEBSIELLA PNEUMONIAE  Respiratory (~20 pathogens) panel by PCR     Status: None    Collection Time: 10/25/22  9:07 PM   Specimen: Nasopharyngeal Swab; Respiratory  Result Value Ref Range Status   Adenovirus NOT DETECTED NOT DETECTED Final   Coronavirus 229E NOT DETECTED NOT DETECTED Final    Comment: (NOTE) The Coronavirus on the Respiratory Panel, DOES NOT test for the novel  Coronavirus (2019 nCoV)    Coronavirus HKU1 NOT DETECTED NOT DETECTED Final   Coronavirus NL63 NOT DETECTED NOT DETECTED Final   Coronavirus OC43 NOT DETECTED NOT DETECTED Final   Metapneumovirus NOT DETECTED NOT DETECTED Final   Rhinovirus / Enterovirus NOT DETECTED NOT DETECTED Final   Influenza A NOT DETECTED NOT DETECTED Final   Influenza B NOT DETECTED NOT DETECTED Final   Parainfluenza Virus 1 NOT DETECTED NOT DETECTED Final   Parainfluenza Virus 2 NOT DETECTED NOT DETECTED Final   Parainfluenza Virus 3 NOT DETECTED NOT DETECTED Final   Parainfluenza Virus 4 NOT DETECTED NOT DETECTED Final   Respiratory Syncytial Virus NOT DETECTED NOT DETECTED Final   Bordetella pertussis NOT DETECTED NOT DETECTED Final   Bordetella Parapertussis NOT DETECTED NOT DETECTED Final   Chlamydophila pneumoniae NOT DETECTED NOT DETECTED Final   Mycoplasma pneumoniae NOT DETECTED NOT DETECTED Final    Comment: Performed at [facility], [address] [place], [place] [address]  MRSA Next Gen by PCR, Nasal     Status: Abnormal   Collection Time: 10/26/22  2:58 AM   Specimen: Nasal Mucosa; Nasal Swab  Result Value Ref Range Status   MRSA by PCR Next Gen DETECTED (A) NOT DETECTED Final    Comment: RESULT CALLED TO, READ BACK BY AND VERIFIED WITH: S. [facility] RN 10/26/22 @ 0404 BY AB (NOTE) The GeneXpert MRSA Assay (FDA approved for NASAL specimens only), is one component of a comprehensive MRSA colonization surveillance program. It is not intended to diagnose MRSA infection nor to guide or monitor treatment for MRSA infections. Test performance is not FDA approved in patients less than 72  years old. Performed at [facility], [address], [place], [place] [address]  Radiology Studies: No results found.     T.  [facility]  If 7PM-7AM, please contact night-coverage [email or web address] 10/28/2022, 12:30 PM

## 2022-10-28 NOTE — Procedures (Signed)
I was present at this dialysis session. I have reviewed the session itself and made appropriate changes.   Filed Weights   10/27/22 0411 10/28/22 0249 10/28/22 0835  Weight: 47.4 kg 49.2 kg 49.6 kg    Recent Labs  Lab 10/25/22 2053 10/26/22 0806 10/28/22 0032  NA 130*   < > 129*  K 3.4*   < > 3.6  CL 102   < > 94*  CO2 11*   < > 19*  GLUCOSE 99   < > 122*  BUN 74*   < > 70*  CREATININE 4.69*   < > 4.21*  CALCIUM 7.8*   < > 7.8*  PHOS 5.8*  --   --    < > = values in this interval not displayed.    Recent Labs  Lab 10/25/22 1351 10/25/22 1354 10/25/22 2053 10/26/22 0622 10/27/22 0032 10/27/22 0911 10/27/22 1738 10/28/22 0032 10/28/22 0741  WBC 12.5*  --  9.1   < > 6.0  --   --  7.2 7.9  NEUTROABS 11.4*  --  8.2*  --   --   --   --   --   --   HGB 6.5*   < > 8.1*   < > 8.2*   < > 9.4* 7.8* 8.8*  HCT 19.3*   < > 24.3*   < > 24.1*   < > 27.7* 23.1* 26.0*  MCV 100.0  --  94.9   < > 92.7  --   --  90.6 90.6  PLT 278  --  248   < > PLATELET CLUMPS NOTED ON SMEAR, UNABLE TO ESTIMATE  --   --  168 192   < > = values in this interval not displayed.    Scheduled Meds:  sodium chloride   Intravenous Once   sodium chloride   Intravenous Once   atorvastatin  10 mg Oral Daily   Chlorhexidine Gluconate Cloth  6 each Topical Q0600   Chlorhexidine Gluconate Cloth  6 each Topical Q0600   Chlorhexidine Gluconate Cloth  6 each Topical Q0600   darbepoetin (ARANESP) injection - DIALYSIS  100 mcg Subcutaneous Q Mon-1800   docusate sodium  100 mg Oral BID   gabapentin  100 mg Oral TID   guaiFENesin  600 mg Oral BID   methylPREDNISolone (SOLU-MEDROL) injection  80 mg Intravenous Daily   mupirocin ointment  1 Application Nasal BID   nicotine  21 mg Transdermal Daily   pantoprazole  40 mg Oral BID   sodium bicarbonate  1,300 mg Oral TID   sodium chloride flush  3 mL Intravenous Q12H   vancomycin variable dose per unstable renal function (pharmacist dosing)   Does not apply See admin  instructions   Continuous Infusions:  sodium chloride     vancomycin     PRN Meds:.sodium chloride, acetaminophen **OR** acetaminophen, benzonatate, hydrALAZINE, levalbuterol, metoprolol tartrate, ondansetron **OR** ondansetron (ZOFRAN) IV, oxyCODONE-acetaminophen, sodium chloride flush   Louie Bun,  MD 10/28/2022, 10:07 AM

## 2022-10-29 DIAGNOSIS — K31819 Angiodysplasia of stomach and duodenum without bleeding: Secondary | ICD-10-CM | POA: Diagnosis not present

## 2022-10-29 DIAGNOSIS — N186 End stage renal disease: Secondary | ICD-10-CM | POA: Diagnosis not present

## 2022-10-29 DIAGNOSIS — J189 Pneumonia, unspecified organism: Secondary | ICD-10-CM | POA: Diagnosis not present

## 2022-10-29 DIAGNOSIS — I739 Peripheral vascular disease, unspecified: Secondary | ICD-10-CM

## 2022-10-29 DIAGNOSIS — Z8774 Personal history of (corrected) congenital malformations of heart and circulatory system: Secondary | ICD-10-CM | POA: Diagnosis not present

## 2022-10-29 DIAGNOSIS — J9601 Acute respiratory failure with hypoxia: Secondary | ICD-10-CM | POA: Diagnosis not present

## 2022-10-29 DIAGNOSIS — R0602 Shortness of breath: Secondary | ICD-10-CM | POA: Diagnosis not present

## 2022-10-29 DIAGNOSIS — Z992 Dependence on renal dialysis: Secondary | ICD-10-CM | POA: Diagnosis not present

## 2022-10-29 DIAGNOSIS — J449 Chronic obstructive pulmonary disease, unspecified: Secondary | ICD-10-CM

## 2022-10-29 DIAGNOSIS — E875 Hyperkalemia: Secondary | ICD-10-CM

## 2022-10-29 DIAGNOSIS — F418 Other specified anxiety disorders: Secondary | ICD-10-CM

## 2022-10-29 DIAGNOSIS — E43 Unspecified severe protein-calorie malnutrition: Secondary | ICD-10-CM | POA: Insufficient documentation

## 2022-10-29 DIAGNOSIS — D649 Anemia, unspecified: Secondary | ICD-10-CM | POA: Diagnosis not present

## 2022-10-29 LAB — RENAL FUNCTION PANEL
Albumin: 1.8 g/dL — ABNORMAL LOW (ref 3.5–5.0)
Anion gap: 12 (ref 5–15)
BUN: 35 mg/dL — ABNORMAL HIGH (ref 8–23)
CO2: 26 mmol/L (ref 22–32)
Calcium: 7.3 mg/dL — ABNORMAL LOW (ref 8.9–10.3)
Chloride: 96 mmol/L — ABNORMAL LOW (ref 98–111)
Creatinine, Ser: 2.44 mg/dL — ABNORMAL HIGH (ref 0.44–1.00)
GFR, Estimated: 22 mL/min — ABNORMAL LOW (ref >60)
Glucose, Bld: 125 mg/dL — ABNORMAL HIGH (ref 70–99)
Phosphorus: 2.7 mg/dL (ref 2.5–4.6)
Potassium: 3.3 mmol/L — ABNORMAL LOW (ref 3.5–5.1)
Sodium: 134 mmol/L — ABNORMAL LOW (ref 135–145)

## 2022-10-29 LAB — HEPATITIS B SURFACE ANTIBODY, QUANTITATIVE: Hep B S AB Quant (Post): <3.5 m[IU]/mL — ABNORMAL LOW

## 2022-10-29 LAB — GLUCOSE, CAPILLARY
Glucose-Capillary: 80 mg/dL (ref 70–99)
Glucose-Capillary: 142 mg/dL — ABNORMAL HIGH (ref 70–99)
Glucose-Capillary: 142 mg/dL — ABNORMAL HIGH (ref 70–99)
Glucose-Capillary: 133 mg/dL — ABNORMAL HIGH (ref 70–99)

## 2022-10-29 MED ORDER — VANCOMYCIN HCL 500 MG/100ML IV SOLN
500.0000 mg | Freq: Once | INTRAVENOUS | Status: AC
  Administered 2022-10-29: 500 mg via INTRAVENOUS
  Filled 2022-10-29: qty 100

## 2022-10-29 MED ORDER — HYDROXYZINE HCL 10 MG PO TABS
10.0000 mg | ORAL_TABLET | Freq: Three times a day (TID) | ORAL | Status: DC | PRN
  Administered 2022-10-29 – 2022-10-30 (×3): 10 mg via ORAL
  Filled 2022-10-29 (×3): qty 1

## 2022-10-29 MED ORDER — CALCITRIOL 0.5 MCG PO CAPS
0.5000 ug | ORAL_CAPSULE | ORAL | Status: DC
  Administered 2022-10-30 – 2022-11-01 (×2): 0.5 ug via ORAL
  Filled 2022-10-29 (×2): qty 1

## 2022-10-29 MED ORDER — VANCOMYCIN HCL 500 MG/100ML IV SOLN
500.0000 mg | INTRAVENOUS | Status: AC
  Administered 2022-11-01: 500 mg via INTRAVENOUS
  Filled 2022-10-29: qty 100

## 2022-10-29 MED ORDER — POTASSIUM CHLORIDE CRYS ER 10 MEQ PO TBCR
30.0000 meq | EXTENDED_RELEASE_TABLET | Freq: Once | ORAL | Status: AC
  Administered 2022-10-29: 30 meq via ORAL
  Filled 2022-10-29: qty 3

## 2022-10-29 MED ORDER — ENSURE ENLIVE PO LIQD
237.0000 mL | Freq: Two times a day (BID) | ORAL | Status: DC
  Administered 2022-10-29 – 2022-11-02 (×5): 237 mL via ORAL

## 2022-10-29 MED ORDER — MAGNESIUM SULFATE IN D5W 1-5 GM/100ML-% IV SOLN
1.0000 g | Freq: Once | INTRAVENOUS | Status: AC
  Administered 2022-10-29: 1 g via INTRAVENOUS
  Filled 2022-10-29: qty 100

## 2022-10-29 MED ORDER — METHYLPREDNISOLONE SODIUM SUCC 40 MG IJ SOLR
40.0000 mg | Freq: Every day | INTRAMUSCULAR | Status: AC
  Administered 2022-10-29: 40 mg via INTRAVENOUS
  Filled 2022-10-29: qty 1

## 2022-10-29 MED ORDER — RENA-VITE PO TABS
1.0000 | ORAL_TABLET | Freq: Every day | ORAL | Status: DC
  Administered 2022-10-29 – 2022-11-01 (×4): 1 via ORAL
  Filled 2022-10-29 (×4): qty 1

## 2022-10-29 NOTE — TOC Progression Note (Addendum)
Transition of Care [facility]) - Progression Note    Patient Details  Name: [name] MRN: [id number] Date of Birth: Oct 14, 1960  Transition of Care [facility]) CM/SW Contact  Lawerance Sabal, RN Phone Number: 10/29/2022, 2:10 PM  Clinical Narrative:     Spoke w patient at bedside to discuss plans for DC.  She confirms that she does not have plans to return to the [facility]. She has been reviewing and calling the agencies/ resources provided by the CM yesterday on homelessness.  She confirms that she gets a monthly check, did not disclose how much, and $20 in food stamps. She confirms she uses [employer].  She states that she does not have her cell phone. Her daughter [name] has it. She granted permission to call [name].   I spoke to her daughter [name] over the phone [phone].  [name] shares a Mohiuddin history with her mother, ultimately leading to [name] getting custody of her 45 year old sister when she was 21.  [name] runs an at home day care and is unable to take the patient into her care.  [name] was not aware that her mother was in the hospital and had not heard from her since July 30th. Her and her sister were about to file a missing person's report. The last time they had contact with her the patient was asking for $300 to extend her stay at the [facility].  [name] confirms that she has the patient's phone, however it appears to have been disconnected from service. She believes that the patient pays around $15/ month for services, and gets around $1,000/ month (unconfirmed).  [name] will speak to her sister and update her that their mom is in the hospital.  Requested [name] to bring in the phone, as service would ideally need to be restored for the patient to program transportation to HD.  Patient is familiar with [facility] and understands that she can use the phone there to schedule HD transportation as well, but she has been noncompliant with HD recently getting kicked out of her last  center.    The address on file, [address] belongs to patient's friend and listed contact Sharyl Nimrod, she no longer lives at that address and patient states [name] "can't do anything to help her."   Please send any DC meds through [facility] pharmacy   [facility] following    Expected Discharge Plan: Homeless Shelter Barriers to Discharge: Continued Medical Work up  Expected Discharge Plan and Services                                               Social Determinants of Health (SDOH) Interventions SDOH Screenings   Food Insecurity: No Food Insecurity (08/13/2022)  Housing: Patient Unable To Answer (08/13/2022)  Transportation Needs: No Transportation Needs (08/13/2022)  Recent Concern: Transportation Needs - Unmet Transportation Needs (08/02/2022)  Utilities: Not At Risk (08/13/2022)  Depression (PHQ2-9): Medium Risk (01/21/2022)  Tobacco Use: High Risk (10/25/2022)    Readmission Risk Interventions    08/13/2022    4:47 PM 08/02/2022    1:56 PM 11/16/2021   12:16 PM  Readmission Risk Prevention Plan  Transportation Screening Complete Complete Complete  PCP or Specialist Appt within 3-5 Days   Complete  HRI or Home Care Consult   Complete  Social Work Consult for Recovery Care Planning/Counseling   Complete  Palliative Care Screening   Not Applicable  Medication Review (RN Care Manager) Complete Referral to Pharmacy Complete  PCP or Specialist appointment within 3-5 days of discharge Complete Complete   HRI or Home Care Consult Complete Complete   SW Recovery Care/Counseling Consult  Complete   Palliative Care Screening Not Applicable Not Applicable   Skilled Nursing Facility Not Applicable Not Applicable

## 2022-10-29 NOTE — Progress Notes (Signed)
   10/29/22 1559  Spiritual Encounters  Type of Visit Initial  Care provided to: Patient  Conversation partners present during encounter Nurse  Referral source Patient request  Reason for visit Routine spiritual support  OnCall Visit No  Interventions  Spiritual Care Interventions Made Established relationship of care and support;Compassionate presence;Reflective listening;Narrative/life review;Prayer  Intervention Outcomes  Outcomes Connection to spiritual care   Chaplain came to the room to answer Spiritual Consult to see chaplain.  Pt is struggling with current health crisis, housing crisis, and several inpactful losses in her life.  Pt feels hopeless and wants to talk with mental health. Chaplain practice reflective listening and compassionate presence.   Chaplain services remain available by Spiritual Consult or for emergent cases, paging [phone]  Chaplain Raelene Bott, [email or web address] [phone]

## 2022-10-29 NOTE — Evaluation (Signed)
Occupational Therapy Evaluation Patient Details Name: [name] MRN: [id number] DOB: 1960-11-28 Today's Date: 10/29/2022   History of Present Illness Patient is a 62 y/o female admitted 10/25/22 with SOB and chest pain.  PMH of ESRD but missed HD x 1 month, COPD, PAD, GERD, GIB, AVM, HLD, anemia, hep C, tobacco use disorder, and homelessness.  Found to have ESBL Klebsiella positive urine culture, and hemoglobin 5.5 with hemoccult positive.   Clinical Impression   Patient reporting that she has been living in a hotel and has had no social support to help her.  Patient reporting being Independent in ADLs.  Patient completed supine to sit with mod I with HOB elevated and sit to stand and functional mobility Independently.  Donned socks Independently.  Patient does not need any further OT intervention at this time.      If plan is discharge home, recommend the following:  (patient currently homeless and had been staying in hotel but no longer able to do so, Case mgmt helping with placement)    Functional Status Assessment  Patient has had a recent decline in their functional status and demonstrates the ability to make significant improvements in function in a reasonable and predictable amount of time.  Equipment Recommendations       Recommendations for Other Services       Precautions / Restrictions Precautions Precautions: Fall Restrictions Weight Bearing Restrictions: No      Mobility Bed Mobility Overal bed mobility: Modified Independent                  Transfers Overall transfer level: Independent Equipment used: None                      Balance                                           ADL either performed or assessed with clinical judgement   ADL Overall ADL's : Independent                                             Vision Baseline Vision/History: 1 Wears glasses       Perception         Praxis          Pertinent Vitals/Pain Pain Assessment Pain Assessment: No/denies pain     Extremity/Trunk Assessment Upper Extremity Assessment Upper Extremity Assessment: Overall WFL for tasks assessed   Lower Extremity Assessment Lower Extremity Assessment: Generalized weakness   Cervical / Trunk Assessment Cervical / Trunk Assessment: Other exceptions Cervical / Trunk Exceptions: very thin   Communication Communication Communication: No apparent difficulties   Cognition Arousal: Alert Behavior During Therapy: Anxious Overall Cognitive Status: Within Functional Limits for tasks assessed                                       General Comments  VSS on RA (SpO2 89% at lowest)    Exercises     Shoulder Instructions      Home Living Family/patient expects to be discharged to:: Other (Comment)  Additional Comments: currently homeless, working on finding shelter      Prior Functioning/Environment Prior Level of Function : Independent/Modified Independent             Mobility Comments: reports was needing cane and more difficulty when taking HD ADLs Comments: does not sit for showers , ind wiht ADLs        OT Problem List:        OT Treatment/Interventions:      OT Goals(Current goals can be found in the care plan section) Acute Rehab OT Goals OT Goal Formulation: With patient Time For Goal Achievement: 11/05/22 Potential to Achieve Goals: Good  OT Frequency:      Co-evaluation              AM-PAC OT "6 Clicks" Daily Activity     Outcome Measure Help from another person eating meals?: None Help from another person taking care of personal grooming?: None Help from another person toileting, which includes using toliet, bedpan, or urinal?: None Help from another person bathing (including washing, rinsing, drying)?: None Help from another person to put on and taking off regular upper body clothing?:  None Help from another person to put on and taking off regular lower body clothing?: None 6 Click Score: 24   End of Session Nurse Communication: Mobility status  Activity Tolerance: Patient tolerated treatment well Patient left: in bed  OT Visit Diagnosis: Unsteadiness on feet (R26.81)                Time: 1358-1410 OT Time Calculation (min): 12 min Charges:  OT General Charges $OT Visit: 1 Visit OT Evaluation $OT Eval Low Complexity: 1 Low  Governor Specking OT/  Denice Paradise 10/29/2022, 3:45 PM

## 2022-10-29 NOTE — Progress Notes (Signed)
Initial Nutrition Assessment  DOCUMENTATION CODES:   Severe malnutrition in context of chronic illness, Underweight  INTERVENTION:  Will liberalize diet to regular to remove restrictions and help maximize PO intake in setting of severe malnutrition. Will continue current fluid restriction ordered of 1500 mL.  Provide Ensure Enlive po BID, each supplement provides 350 kcal and 20 grams of protein. Pt prefers strawberry or chocolate flavor.  Monitor magnesium, potassium, and phosphorus daily for at least 3 days, MD to replete as needed, as pt is at risk for refeeding syndrome.   Provide Renav-ite po at bedtime.   NUTRITION DIAGNOSIS:   Severe Malnutrition related to chronic illness (ESRD on HD, COPD) as evidenced by severe fat depletion, severe muscle depletion, 23% weight loss over 1 year.  GOAL:   Patient will meet greater than or equal to 90% of their needs  MONITOR:   PO intake, Supplement acceptance, Labs, Weight trends, I & O's  REASON FOR ASSESSMENT:   Consult Assessment of nutrition requirement/status  ASSESSMENT:   62 year old female with PMHx of ESRD on HD (missed HD for over a month PTA), COPD, PAD, GERD, GI bleed, AVM, anemia, HLD, hep C, tobacco use disorder admitted with COPD exacerbation and sepsis secondary to bilateral PNA. Pt is currently unhoused.  Met with pt at bedside. Pt tearful during RD assessment. She reports she has had very poor PO intake for at least 3 months as she is currently unhoused and does not have regular access to food. She reports she cannot follow a regular meal pattern and just has to eat what she can find. She typically may have instant mashed potatoes, crackers, or chips. Pt reports she is hungry and feels she will be able to eat well here. She reports desire to maximize intake here and is hopeful to start gaining weight back over time. Consult to [facility] has been placed to help with getting access to resources outpatient. Pt denies food  allergies or intolerances. Denies nausea, emesis, or abdominal pain. Pt is edentulous and reports she does not have any dentures. However, she denies any difficulty with chewing and swallowing and would like to stay on a regular texture diet. Pt would benefit from diet liberalization to regular to help maximize intake in setting of severe malnutrition. She would also benefit from oral nutrition supplement to help meet calorie/protein needs. She prefers strawberry or chocolate Ensure. She requested to start with BID supplements, but pending intake may require further supplementation. Pt may be at risk for refeeding syndrome.   Pt reports she has had weight loss over time. She reports her original UBW was around 150 lbs. She then slowly lost to around 128 lbs (58.2 kg). She reports over the last year she has now lost to around 100 lbs. Admission wt was 44.5 kg on 8/9. Pt was 57.9 kg on 11/15/21. Over the past year pt has lost 13.4 kg or 23% weight, which is significant for time frame. Suspect fluctuations in weight in chart may be related to fluid status. Current wt is documented to be 48 kg. Will continue to monitor trends.  Medications reviewed and include: calcitriol 0.5 micrograms every M/W/F with HD, Darbepoetin 100 micrograms every Monday, Colace 100 mg BID, gabapentin, nicotine patch, pantoprazole, sodium bicarbonate 1300 mg TID, meropenem, vancomycin  Labs reviewed: CBG 80-143, Sodium 134, Potassium 3.3, Chloride 96, BUN 35, Creatinine 2.44, Magnesium 1.6, Calcium 7.3 (corrects to 9.22 with albumin 1.8) 25-OH vitamin D: 25.72 L on 10/26/22 Discussed with Nephrology  via secure chat and plan is to start calcitriol  UOP: 1-2 occurrences of unmeasured UOP daily  I/O: -12424 mL since admission  NUTRITION - FOCUSED PHYSICAL EXAM:  Flowsheet Row Most Recent Value  Orbital Region Severe depletion  Upper Arm Region Severe depletion  Thoracic and Lumbar Region Severe depletion  Buccal Region Severe  depletion  Temple Region Severe depletion  Clavicle Bone Region Severe depletion  Clavicle and Acromion Bone Region Severe depletion  Scapular Bone Region Severe depletion  Dorsal Hand Severe depletion  Patellar Region Severe depletion  Anterior Thigh Region Severe depletion  Posterior Calf Region Severe depletion  Edema (RD Assessment) Mild  Hair Reviewed  Eyes Reviewed  Mouth Reviewed  [edentulous]  Skin Reviewed  Nails Reviewed      Diet Order:   Diet Order             Diet renal with fluid restriction Fluid restriction: 1500 mL Fluid; Room service appropriate? Yes; Fluid consistency: Thin  Diet effective now                  EDUCATION NEEDS:   Not appropriate for education at this time  Skin:  Skin Assessment: Reviewed RN Assessment  Last BM:  Unknown  Height:   Ht Readings from Last 1 Encounters:  10/25/22 5\' 7"  (1.702 m)   Weight:   Wt Readings from Last 1 Encounters:  10/29/22 48 kg   Ideal Body Weight:  61.4 kg  BMI:  Body mass index is 16.59 kg/m.  Estimated Nutritional Needs:   Kcal:  1500-1700  Protein:  75-85 grams  Fluid:  UOP + 1 L   Tollie Eth, MS, RD, LDN, CNSC Pager number available on Amion

## 2022-10-29 NOTE — Progress Notes (Signed)
[name] Progress Note   Subjective:   Mostly frustrated regarding social issues but provides no other specific history. No issues with HD yesterday  Objective Vitals:   10/29/22 0023 10/29/22 0448 10/29/22 0748 10/29/22 0816  BP:   116/73   Pulse:   68   Resp:   13   Temp: 97.6 F (36.4 C) 97.7 F (36.5 C) 97.7 F (36.5 C)   TempSrc: Oral Oral Oral   SpO2:   100% 100%  Weight:  48 kg    Height:       Physical Exam General: Alert female, chronically ill appearing, NAD Heart: Normal rate, no rub Lungs: Bilateral chest rise with no increased work of breathing Abdomen: Soft, non-distended, +BS Extremities: No edema b/l lower extremities Dialysis Access:  TDC, RUE AVF +bruit  Additional Objective Labs: Basic Metabolic Panel: Recent Labs  Lab 10/25/22 2053 10/26/22 0806 10/27/22 0032 10/28/22 0032 10/29/22 0110  NA 130*   < > 129* 129* 134*  K 3.4*   < > 3.4* 3.6 3.3*  CL 102   < > 91* 94* 96*  CO2 11*   < > 19* 19* 26  GLUCOSE 99   < > 142* 122* 125*  BUN 74*   < > 42* 70* 35*  CREATININE 4.69*   < > 2.93* 4.21* 2.44*  CALCIUM 7.8*   < > 8.0* 7.8* 7.3*  PHOS 5.8*  --   --   --  2.7   < > = values in this interval not displayed.   Liver Function Tests: Recent Labs  Lab 10/25/22 1351 10/25/22 2053 10/27/22 0032 10/28/22 0032 10/29/22 0110  AST 23  --  26 33  --   ALT 15  --  13 15  --   ALKPHOS 76  --  55 48  --   BILITOT 0.6  --  0.4 0.7  --   PROT 6.3*  --  5.2* 5.5*  --   ALBUMIN 2.5*   < > 1.9* 1.9* 1.8*   < > = values in this interval not displayed.   Recent Labs  Lab 10/25/22 1351  LIPASE 41   CBC: Recent Labs  Lab 10/25/22 1351 10/25/22 1354 10/25/22 2053 10/26/22 0622 10/26/22 0806 10/26/22 1816 10/27/22 0032 10/27/22 0911 10/28/22 0032 10/28/22 0741 10/29/22 0110  WBC 12.5*  --  9.1   < > 5.6  --  6.0  --  7.2 7.9 8.3  NEUTROABS 11.4*  --  8.2*  --   --   --   --   --   --   --   --   HGB 6.5*   < > 8.1*   <  > 7.4*   < > 8.2*   < > 7.8* 8.8* 7.8*  HCT 19.3*   < > 24.3*   < > 21.2*   < > 24.1*   < > 23.1* 26.0* 23.4*  MCV 100.0  --  94.9   < > 91.8  --  92.7  --  90.6 90.6 92.5  PLT 278  --  248   < > 189  --  PLATELET CLUMPS NOTED ON SMEAR, UNABLE TO ESTIMATE  --  168 192 191   < > = values in this interval not displayed.   Blood Culture    Component Value Date/Time   SDES URINE, CLEAN CATCH 10/25/2022 2015   SPECREQUEST  10/25/2022 2015    NONE Reflexed from [id number] Performed at [facility]  [facility], [address] [place], [place] [address]    CULT (A) 10/25/2022 2015    >=100,000 COLONIES/mL KLEBSIELLA PNEUMONIAE Confirmed Extended Spectrum Beta-Lactamase Producer (ESBL).  In bloodstream infections from ESBL organisms, carbapenems are preferred over piperacillin/tazobactam. They are shown to have a lower risk of mortality.    REPTSTATUS 10/27/2022 FINAL 10/25/2022 2015    Cardiac Enzymes: No results for input(s): "CKTOTAL", "CKMB", "CKMBINDEX", "TROPONINI" in the last 168 hours. CBG: Recent Labs  Lab 10/28/22 0632 10/28/22 1401 10/28/22 1615 10/28/22 2108 10/29/22 0555  GLUCAP 90 85 137* 143* 80   Iron Studies:  No results for input(s): "IRON", "TIBC", "TRANSFERRIN", "FERRITIN" in the last 72 hours.  @lablastinr3 @ Studies/Results: No results found. Medications:  sodium chloride     meropenem (MERREM) IV Stopped (10/28/22 1815)    sodium chloride   Intravenous Once   sodium chloride   Intravenous Once   arformoterol  15 mcg Nebulization BID   atorvastatin  10 mg Oral Daily   budesonide (PULMICORT) nebulizer solution  0.5 mg Nebulization BID   busPIRone  10 mg Oral BID   Chlorhexidine Gluconate Cloth  6 each Topical Q0600   Chlorhexidine Gluconate Cloth  6 each Topical Q0600   Chlorhexidine Gluconate Cloth  6 each Topical Q0600   darbepoetin (ARANESP) injection - DIALYSIS  100 mcg Subcutaneous Q Mon-1800   docusate sodium  100 mg Oral BID   gabapentin  100 mg Oral TID    guaiFENesin  600 mg Oral BID   mupirocin ointment  1 Application Nasal BID   nicotine  21 mg Transdermal Daily   pantoprazole  40 mg Oral BID   revefenacin  175 mcg Nebulization Daily   sodium bicarbonate  1,300 mg Oral TID   sodium chloride flush  3 mL Intravenous Q12H   vancomycin variable dose per unstable renal function (pharmacist dosing)   Does not apply See admin instructions    OP Dialysis Orders: Previously at [facility], unable to obtain records- may have been discharged from clinic   Assessment/Plan: Missed dialysis / ESRD- She reports she missed 1 month of dialysis but was feeling well until this week.  Requirements for dialysis on arrival were acidosis and volume overload.  Maintain MWF schedule for now. Trying to get established back in outpatient dialysis with social work.  Mostly dependent on where she will live after the hospital Acute hypoxic respiratory failure: Possibly pneumonia but also volume overloaded.  Has improved.  Ultrafiltration with dialysis. Metabolic acidosis: Related to renal failure.  Improved with dialysis and supplementation HTN/ volume -improved with dialysis and volume removal Anemia esrd - Hb 7.8 today. s/p PRBC transfusion. GI onboard. ESA ordered here. Iron very low but hard to interpret given she had transfusion. Holding iron given possible infection. Plans for EGD at some point per GI. MBD ckd - CCa in range, phos at goal Dialysis access- Has [facility], looks like she missed calls from vascular surgery to schedule second stage surgery, will need to follow up outpatient vs consult VVS if stay is prolonged.

## 2022-10-29 NOTE — Progress Notes (Signed)
Patient ID: [name], female   DOB: 18-Mar-1961, 62 y.o.   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  Patient trying to eat lunch but does not have much appetite no specific GI complaints still tearful and asking for increased doses of anxiolytics, and asking for social services help  She is off O2, satting in the high 90s shortness of breath currently  IV meropenem Dialysis scheduled for tomorrow  Labs-WBC 8.3/hemoglobin 7.8/hematocrit 23.4 K+3.3, BUN 35/creatinine 2.44-proved   Objective   Vital signs in last 24 hours: Temp:  [97.6 F (36.4 C)-97.8 F (36.6 C)] 97.7 F (36.5 C) (08/13 0748) Pulse Rate:  [68-95] 89 (08/13 1147) Resp:  [13-20] 17 (08/13 1147) BP: (116-145)/(72-75) 142/75 (08/13 1147) SpO2:  [99 %-100 %] 100 % (08/13 1147) Weight:  [48 kg] 48 kg (08/13 0448) Last BM Date : 10/25/22 General: Chronically ill-appearing older white female in NAD Heart:  Regular rate and rhythm; no murmurs Lungs: Respirations even and unlabored,  Abdomen:  Soft, nontender and nondistended. Normal bowel sounds. Extremities:  Without edema. Neurologic:  Alert and oriented,  grossly normal neurologically. Psych:  Cooperative. Normal mood and affect.  Intake/Output from previous day: 08/12 0701 - 08/13 0700 In: 360 [P.O.:360] Out: 2083.8 [Stool:1] Intake/Output this shift: Total I/O In: 100 [IV Piggyback:100] Out: -   Lab Results: Recent Labs    10/28/22 0032 10/28/22 0741 10/29/22 0110  WBC 7.2 7.9 8.3  HGB 7.8* 8.8* 7.8*  HCT 23.1* 26.0* 23.4*  PLT 168 192 191   BMET Recent Labs    10/27/22 0032 10/28/22 0032 10/29/22 0110  NA 129* 129* 134*  K 3.4* 3.6 3.3*  CL 91* 94* 96*  CO2 19* 19* 26  GLUCOSE 142* 122* 125*  BUN 42* 70* 35*  CREATININE 2.93* 4.21* 2.44*  CALCIUM 8.0* 7.8*  7.3*   LFT Recent Labs    10/28/22 0032 10/29/22 0110  PROT 5.5*  --   ALBUMIN 1.9* 1.8*  AST 33  --   ALT 15  --   ALKPHOS 48  --   BILITOT 0.7  --    PT/INR No results for input(s): "LABPROT", "INR" in the last 72 hours.  Studies/Results: No results found.     Assessment / Plan:    #62  62 year old white female, homeless admitted with shortness of breath in setting of end-stage renal disease, COPD, peripheral arterial disease and history of recurrent anemia felt secondary to previously documented AVMs in addition to underlying end-stage renal disease. hgb 6.5 on admission, she has been transfused, hemoglobin 7.8 today  Enteroscopy May 2024 with finding of mild gastritis, previously biopsied H. pylori negative she had 6 small AVMs in the gastric cardia and bodyache treated with APC and a single AVM in the duodenal bulb did with APC  2 end-stage renal disease-on dialysis had been noncompliant recently, had dialysis yesterday  #3 right lower lobe pneumonia-improving, now off O2 #4 COPD exacerbation associated with above #5 UTI-ESBL Klebsiella #6 mild hypokalemia- needs replaced #7 anxiety/depression  Plan; continue to trend hemoglobin Continue daily PPI Start erythropoietin as per nephrology  Patient is scheduled for enteroscopy with Dr. Rhea Belton on Thursday, 10/31/2022 with APC of any visualized AVMs.      Principal Problem:   Right lower lobe pneumonia Active Problems:   Essential hypertension  Hepatitis C   Chronic disease anemia   GERD (gastroesophageal reflux disease)   Metabolic acidosis   Chronic hyponatremia   Chronic diastolic CHF (congestive heart failure) (HCC)   Acute on chronic blood loss anemia   Acute hypoxic respiratory failure (HCC)   ESRD on dialysis-noncompliant with outpatient dialysis due to homeless [facility])   History of arteriovenous malformation (AVM)   COPD with acute exacerbation (HCC)   Premature atrial complex due to COPD exacerbation  and acute hypoxic respiratory failure   SIRS (systemic inflammatory response syndrome) (HCC)   Chronic pain syndrome   Angiodysplasia of upper gastrointestinal tract     LOS: 4 days     PA-C 10/29/2022, 2:48 PM

## 2022-10-29 NOTE — H&P (View-Only) (Signed)
Patient ID: [name], female   DOB: 18-Mar-1961, 62 y.o.   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  Patient trying to eat lunch but does not have much appetite no specific GI complaints still tearful and asking for increased doses of anxiolytics, and asking for social services help  She is off O2, satting in the high 90s shortness of breath currently  IV meropenem Dialysis scheduled for tomorrow  Labs-WBC 8.3/hemoglobin 7.8/hematocrit 23.4 K+3.3, BUN 35/creatinine 2.44-proved   Objective   Vital signs in last 24 hours: Temp:  [97.6 F (36.4 C)-97.8 F (36.6 C)] 97.7 F (36.5 C) (08/13 0748) Pulse Rate:  [68-95] 89 (08/13 1147) Resp:  [13-20] 17 (08/13 1147) BP: (116-145)/(72-75) 142/75 (08/13 1147) SpO2:  [99 %-100 %] 100 % (08/13 1147) Weight:  [48 kg] 48 kg (08/13 0448) Last BM Date : 10/25/22 General: Chronically ill-appearing older white female in NAD Heart:  Regular rate and rhythm; no murmurs Lungs: Respirations even and unlabored,  Abdomen:  Soft, nontender and nondistended. Normal bowel sounds. Extremities:  Without edema. Neurologic:  Alert and oriented,  grossly normal neurologically. Psych:  Cooperative. Normal mood and affect.  Intake/Output from previous day: 08/12 0701 - 08/13 0700 In: 360 [P.O.:360] Out: 2083.8 [Stool:1] Intake/Output this shift: Total I/O In: 100 [IV Piggyback:100] Out: -   Lab Results: Recent Labs    10/28/22 0032 10/28/22 0741 10/29/22 0110  WBC 7.2 7.9 8.3  HGB 7.8* 8.8* 7.8*  HCT 23.1* 26.0* 23.4*  PLT 168 192 191   BMET Recent Labs    10/27/22 0032 10/28/22 0032 10/29/22 0110  NA 129* 129* 134*  K 3.4* 3.6 3.3*  CL 91* 94* 96*  CO2 19* 19* 26  GLUCOSE 142* 122* 125*  BUN 42* 70* 35*  CREATININE 2.93* 4.21* 2.44*  CALCIUM 8.0* 7.8*  7.3*   LFT Recent Labs    10/28/22 0032 10/29/22 0110  PROT 5.5*  --   ALBUMIN 1.9* 1.8*  AST 33  --   ALT 15  --   ALKPHOS 48  --   BILITOT 0.7  --    PT/INR No results for input(s): "LABPROT", "INR" in the last 72 hours.  Studies/Results: No results found.     Assessment / Plan:    #72  62 year old white female, homeless admitted with shortness of breath in setting of end-stage renal disease, COPD, peripheral arterial disease and history of recurrent anemia felt secondary to previously documented AVMs in addition to underlying end-stage renal disease. hgb 6.5 on admission, she has been transfused, hemoglobin 7.8 today  Enteroscopy May 2024 with finding of mild gastritis, previously biopsied H. pylori negative she had 6 small AVMs in the gastric cardia and bodyache treated with APC and a single AVM in the duodenal bulb did with APC  2 end-stage renal disease-on dialysis had been noncompliant recently, had dialysis yesterday  #3 right lower lobe pneumonia-improving, now off O2 #4 COPD exacerbation associated with above #5 UTI-ESBL Klebsiella #6 mild hypokalemia- needs replaced #7 anxiety/depression  Plan; continue to trend hemoglobin Continue daily PPI Start erythropoietin as per nephrology  Patient is scheduled for enteroscopy with Dr. Rhea Belton on Thursday, 10/31/2022 with APC of any visualized AVMs.      Principal Problem:   Right lower lobe pneumonia Active Problems:   Essential hypertension  Hepatitis C   Chronic disease anemia   GERD (gastroesophageal reflux disease)   Metabolic acidosis   Chronic hyponatremia   Chronic diastolic CHF (congestive heart failure) (HCC)   Acute on chronic blood loss anemia   Acute hypoxic respiratory failure (HCC)   ESRD on dialysis-noncompliant with outpatient dialysis due to homeless [facility])   History of arteriovenous malformation (AVM)   COPD with acute exacerbation (HCC)   Premature atrial complex due to COPD exacerbation  and acute hypoxic respiratory failure   SIRS (systemic inflammatory response syndrome) (HCC)   Chronic pain syndrome   Angiodysplasia of upper gastrointestinal tract     LOS: 4 days   Demorio Seeley PA-C 10/29/2022, 2:48 PM

## 2022-10-29 NOTE — TOC Progression Note (Signed)
Transition of Care [facility]) - Progression Note    Patient Details  Name: [name] MRN: [id number] Date of Birth: 1960/11/18  [facility] of [facility]) CM/SW Contact  Leander Rams, LCSW Phone Number: 10/29/2022, 3:57 PM  Clinical Narrative:    CSW met pt at bedside. Pt was engaging in conversation with the Chaplain who was also present. CSW provided pt with hard copies of resources for homelessness. Pt thanked CSW. CSW stepped out to allow so pt could continue interaction with the Chaplain.   TOC will continue to follow.    Expected Discharge Plan: Homeless Shelter Barriers to Discharge: Continued Medical Work up  Expected Discharge Plan and Services                                               Social Determinants of Health (SDOH) Interventions SDOH Screenings   Food Insecurity: No Food Insecurity (08/13/2022)  Housing: Patient Unable To Answer (08/13/2022)  Transportation Needs: No Transportation Needs (08/13/2022)  Recent Concern: Transportation Needs - Unmet Transportation Needs (08/02/2022)  Utilities: Not At Risk (08/13/2022)  Depression (PHQ2-9): Medium Risk (01/21/2022)  Tobacco Use: High Risk (10/25/2022)    Readmission Risk Interventions    08/13/2022    4:47 PM 08/02/2022    1:56 PM 11/16/2021   12:16 PM  Readmission Risk Prevention Plan  Transportation Screening Complete Complete Complete  PCP or Specialist Appt within 3-5 Days   Complete  HRI or Home Care Consult   Complete  Social Work Consult for Recovery Care Planning/Counseling   Complete  Palliative Care Screening   Not Applicable  Medication Review Oceanographer) Complete Referral to Pharmacy Complete  PCP or Specialist appointment within 3-5 days of discharge Complete Complete   HRI or Home Care Consult Complete Complete   SW Recovery Care/Counseling Consult  Complete   Palliative Care Screening Not Applicable Not Applicable   Skilled Nursing Facility Not Applicable Not Applicable     Oletta Lamas, MSW, [place], LCASA Transitions of Care  Clinical Social Worker I

## 2022-10-29 NOTE — Progress Notes (Addendum)
Pharmacy Antibiotic Note  [name] is a 62 y.o. female for which pharmacy has been consulted for vancomycin and meropenem dosing for pneumonia.  Patient with a history of ESRD on HD, HTN, PAD, GED, anemia, COPD, hepatitis C, HLD, hx of GIB. Patient presenting with SOB and chest pain. CXR concerning for pna.   -last HD 8/12, vancomycin not given -HD planned for 8/14  Plan: Vancomycin 500mg  IV x1 today then will continue as MWF for now Will follow HD plans Vancomycin levels when indicated Continue Meropenem 500 mg Q24h, give in evenings after HD   Height: 5\' 7"  (170.2 cm) Weight: 48 kg (105 lb 14.4 oz) IBW/kg (Calculated) : 61.6  Temp (24hrs), Avg:97.9 F (36.6 C), Min:97.6 F (36.4 C), Max:98.2 F (36.8 C)  Recent Labs  Lab 10/25/22 1342 10/25/22 1351 10/25/22 2017 10/25/22 2053 10/26/22 0622 10/26/22 0806 10/27/22 0032 10/28/22 0032 10/28/22 0741 10/29/22 0110  WBC  --    < >  --  9.1   < > 5.6 6.0 7.2 7.9 8.3  CREATININE  --    < >  --  4.69*  --  2.24* 2.93* 4.21*  --  2.44*  LATICACIDVEN 1.6  --  1.0  --   --   --   --   --   --   --    < > = values in this interval not displayed.    Estimated Creatinine Clearance: 18.1 mL/min (A) (by C-G formula based on SCr of 2.44 mg/dL (H)).    Allergies  Allergen Reactions   Zestril [Lisinopril] Other (See Comments)    Hyperkalemia   Antimicrobials this admission: 8/9 cefepime >> 8/12  8/9 vancomycin >>  8/12 meropenem >>   Microbiology results: 8/9 Ucx: Klebsiella pneum. ESBL, sensitive to imipenem & pip/tazo 8/9 Bcx:No growth in 3 days 8/10 MRSA PCR: detected  Harland German, PharmD Clinical Pharmacist **Pharmacist phone directory can now be found on [email or web address] ([email or web address]).  Listed under [facility].

## 2022-10-29 NOTE — Evaluation (Signed)
Physical Therapy Evaluation Patient Details Name: [name] MRN: [id number] DOB: [birth year] Today's Date: 10/29/2022  History of Present Illness  Patient is a 63 y/o female admitted 10/25/22 with SOB and chest pain.  PMH of ESRD but missed HD x 1 month, COPD, PAD, GERD, GIB, AVM, HLD, anemia, hep C, tobacco use disorder, and homelessness.  Found to have ESBL Klebsiella positive urine culture, and hemoglobin 5.5 with hemoccult positive.  Clinical Impression  Patient presents with decreased mobility due to decreased balance, decreased activity tolerance and generalized weakness.  States had no problem walking up to a mile or more a day but had some decreased mobility seemingly when taking HD as prescribed.  Does have h/o neuropathy she takes meds for.  She was able to walk with S in hallway holding IV pole and some noted instability.  She will benefit from skilled PT in the acute setting to progress mobility/balance/safety prior to d/c.        If plan is discharge home, recommend the following: Assist for transportation;Help with stairs or ramp for entrance   Can travel by private vehicle        Equipment Recommendations None recommended by PT  Recommendations for Other Services       Functional Status Assessment Patient has had a recent decline in their functional status and demonstrates the ability to make significant improvements in function in a reasonable and predictable amount of time.     Precautions / Restrictions Precautions Precautions: Fall      Mobility  Bed Mobility Overal bed mobility: Modified Independent                  Transfers Overall transfer level: Modified independent                      Ambulation/Gait Ambulation/Gait assistance: Supervision Gait Distance (Feet): 600 Feet Assistive device: IV Pole Gait Pattern/deviations: Step-through pattern, Decreased stride length, Drifts right/left       General Gait Details: when looking to  L veers to R; hand on IV pole throughout though more confident with increased distance, still kept hand on IV pole and still with some instability at times  Stairs            Wheelchair Mobility     Tilt Bed    Modified Rankin (Stroke Patients Only)       Balance Overall balance assessment: Needs assistance   Sitting balance-Leahy Scale: Good     Standing balance support: No upper extremity supported Standing balance-Leahy Scale: Good                               Pertinent Vitals/Pain Pain Assessment Pain Assessment: Faces Faces Pain Scale: Hurts a little bit Pain Location: feet (neuropathy) Pain Descriptors / Indicators: Discomfort Pain Intervention(s): Monitored during session    Home Living Family/patient expects to be discharged to:: Other (Comment)                   Additional Comments: currently homeless, working on finding shelter    Prior Function Prior Level of Function : Independent/Modified Independent             Mobility Comments: reports was needing cane and more difficulty when taking HD       Extremity/Trunk Assessment   Upper Extremity Assessment Upper Extremity Assessment: Overall WFL for tasks assessed    Lower Extremity Assessment  Lower Extremity Assessment: Generalized weakness    Cervical / Trunk Assessment Cervical / Trunk Assessment: Other exceptions Cervical / Trunk Exceptions: very thin  Communication   Communication Communication: No apparent difficulties  Cognition Arousal: Alert Behavior During Therapy: Anxious Overall Cognitive Status: Within Functional Limits for tasks assessed                                 General Comments: crying at times talking about difficulty with her "nerves" states was asking for medication and MD said difficult to use with other meds, states if it is due to the Trazadone she will go off so she can get something her her nerves        General  Comments General comments (skin integrity, edema, etc.): VSS on RA (SpO2 89% at lowest)    Exercises     Assessment/Plan    PT Assessment Patient needs continued PT services  PT Problem List Decreased strength;Decreased balance;Decreased mobility;Decreased safety awareness       PT Treatment Interventions Functional mobility training;Balance training;Patient/family education;Gait training;Stair training;Therapeutic exercise;Therapeutic activities    PT Goals (Current goals can be found in the Care Plan section)  Acute Rehab PT Goals Patient Stated Goal: to find housing PT Goal Formulation: With patient Time For Goal Achievement: 11/12/22 Potential to Achieve Goals: Good    Frequency Min 1X/week     Co-evaluation               AM-PAC PT "6 Clicks" Mobility  Outcome Measure Help needed turning from your back to your side while in a flat bed without using bedrails?: None Help needed moving from lying on your back to sitting on the side of a flat bed without using bedrails?: None Help needed moving to and from a bed to a chair (including a wheelchair)?: None Help needed standing up from a chair using your arms (e.g., wheelchair or bedside chair)?: None Help needed to walk in hospital room?: A Little Help needed climbing 3-5 steps with a railing? : Total 6 Click Score: 20    End of Session Equipment Utilized During Treatment: Gait belt Activity Tolerance: Patient tolerated treatment well Patient left: in bed;with call bell/phone within reach   PT Visit Diagnosis: Muscle weakness (generalized) (M62.81)    Time: 6237-6283 PT Time Calculation (min) (ACUTE ONLY): 23 min   Charges:   PT Evaluation $PT Eval Low Complexity: 1 Low PT Treatments $Gait Training: 8-22 mins PT General Charges $$ ACUTE PT VISIT: 1 Visit         [name], PT Acute Rehabilitation Services Office:[phone] 10/29/2022   [name] 10/29/2022, 12:44 PM

## 2022-10-29 NOTE — Progress Notes (Signed)
PROGRESS NOTE  [name] UKG:[id number] DOB: December 25, 1960   PCP: [name], MD  Patient is from: Homeless  DOA: 10/25/2022 LOS: 4  Chief complaints Chief Complaint  Patient presents with   Shortness of Breath     Brief Narrative / Interim history: 62 year old F with PMH of ESRD noncompliant with HD (missed HD for over a month), COPD, PAD, GERD, GIB, AVM, anemia, HLD, hep C, tobacco use disorder and homelessness presenting with shortness of breath and chest pain, and admitted for COPD exacerbation and sepsis due to bilateral pneumonia.  Started on steroid, antibiotics and nebulizers.  Urine culture with ESBL Klebsiella.  She was initially on IV cefepime and switched to IV meropenem after urine culture sensitivity.  Hemoglobin was 5.5 on admission.  Hemoccult was positive.  She was transfused 2 units with improvement.  GI consulted and considering EGD/capsule endoscopy down the road.  Needs outpatient dialysis chair as well.   Subjective: Seen and examined earlier this morning.  No major events overnight of this morning.  Breathing has improved.  Asking for something to help with anxiety.  Objective: Vitals:   10/29/22 0448 10/29/22 0748 10/29/22 0816 10/29/22 1147  BP:  116/73  (!) 142/75  Pulse:  68  89  Resp:  13  17  Temp: 97.7 F (36.5 C) 97.7 F (36.5 C)    TempSrc: Oral Oral  Oral  SpO2:  100% 100% 100%  Weight: 48 kg     Height:        Examination:  GENERAL: Appears frail.  No apparent distress. HEENT: MMM.  Vision and hearing grossly intact.  NECK: Supple.  No apparent JVD.  RESP:  No IWOB.  Fair aeration bilaterally.  Rhonchi bilaterally. CVS:  RRR. Heart sounds normal.  ABD/GI/GU: BS+. Abd soft, NTND.  MSK/EXT:  Moves extremities.  Significant muscle mass and subcu fat loss.  None SKIN: no apparent skin lesion or wound NEURO: Awake, alert and oriented appropriately.  No apparent focal neuro deficit. PSYCH: Calm. Normal affect.   Procedures:   None  Microbiology summarized: 8/9-full RVP nonreactive 8/10-MRSA PCR screen positive 8/9-blood culture with staph epidermis with methicillin resistance in 1 out of 4 bottles. 8/9-urine culture with ESBL Klebsiella pneumonia.  Assessment and plan: Principal Problem:   Right lower lobe pneumonia Active Problems:   Metabolic acidosis   Chronic hyponatremia   Acute on chronic blood loss anemia   Acute hypoxic respiratory failure (HCC)   ESRD on dialysis-noncompliant with outpatient dialysis due to homeless [facility])   History of arteriovenous malformation (AVM)   COPD with acute exacerbation (HCC)   SIRS (systemic inflammatory response syndrome) (HCC)   Essential hypertension   Hepatitis C   Chronic disease anemia   GERD (gastroesophageal reflux disease)   Chronic diastolic CHF (congestive heart failure) (HCC)   Premature atrial complex due to COPD exacerbation and acute hypoxic respiratory failure   Chronic pain syndrome   Angiodysplasia of upper gastrointestinal tract   Protein-calorie malnutrition, severe  Sepsis secondary to community-acquired pneumonia, bilateral pneumonia: Culture data as above. -Continue vancomycin with HD for total of 7 days -Changed cefepime to meropenem on 8/12 given ESBL Klebsiella pneumonia in urine. -Continue scheduled and as needed nebulizers nebulizers and pulmonary toilet.   COPD exacerbation: Still with significant cough.  Smokes about half to 1 pack a day.  Doubt compliance with inhalers. -Decrease Solu-Medrol to 80 mg daily. -Antibiotics as above -Continue Brovana, Pulmicort and Yupelri -Continue Xopenex as needed -Continue mucolytic's and  antitussive -Encouraged smoking cessation. -Atarax for anxiety   Acute hypoxic respiratory failure: Required 2 L at some point.  Resolved. -Manage pneumonia and COPD as above   ESBL UTI: Urine culture with ESBL Klebsiella pneumonia. -Change IV cefepime to meropenem.  Treat for 3 days.  ESRD/metabolic  acidosis/hyponatremia: Reportedly has not had dialysis in over a month.  She is homeless and has no phone.  Makes urine.  Seems like she is on Lasix at home. -Nephrology on board   Positive blood culture: Blood culture with staph epidermis with methicillin-resistant in 1 out of 4 bottles likely contaminant.   Acute on chronic blood loss anemia/GI bleed: history of recurrent GI bleed, history of AVM: H&H stable after 2 units. Recent Labs    10/26/22 0622 10/26/22 0806 10/26/22 1816 10/27/22 0032 10/27/22 0911 10/27/22 1340 10/27/22 1738 10/28/22 0032 10/28/22 0741 10/29/22 0110  HGB 5.5* 7.4* 7.8* 8.2* 8.6* 8.3* 9.4* 7.8* 8.8* 7.8*  -Continue monitoring -Aranesp per nephrology -Continue Protonix -Plan for enteroscopy on 8/15 by GI.    Hypokalemia:  -Monitor and replenish as appropriate.   Homelessness: Follow-up with social worker to assist with disposition.   Anxiety: Reports feeling anxious.  Could be exacerbated by steroid. -Continue gabapentin and BuSpar -Add Atarax 10 mg 3 times daily as needed -Wean off steroid as quick as possible  Tobacco use disorder: Reports cutting down to half a pack a day recently due to shortness of breath. -Encourage cessation -Nicotine patch  Homelessness: -TOC on board.  Severe malnutrition: POA Body mass index is 16.59 kg/m. Nutrition Problem: Severe Malnutrition Etiology: chronic illness (ESRD on HD, COPD) Signs/Symptoms: severe fat depletion, severe muscle depletion, percent weight loss Percent weight loss: 23 % (over 1 year) Interventions: Refer to RD note for recommendations   DVT prophylaxis:  SCDs Start: 10/25/22 2022  Code Status: Full code Family Communication: None at bedside Level of care: Telemetry Medical Status is: Inpatient Remains inpatient appropriate because: Pneumonia, COPD exacerbation, GI bleed and ESRD   Final disposition: TBD Consultants:  Nephrology Gastroenterology  55 minutes with more than  50% spent in reviewing records, counseling patient/family and coordinating care.   Sch Meds:  Scheduled Meds:  sodium chloride   Intravenous Once   sodium chloride   Intravenous Once   arformoterol  15 mcg Nebulization BID   atorvastatin  10 mg Oral Daily   budesonide (PULMICORT) nebulizer solution  0.5 mg Nebulization BID   busPIRone  10 mg Oral BID   [START ON 10/30/2022] calcitRIOL  0.5 mcg Oral Q M,W,F-HD   Chlorhexidine Gluconate Cloth  6 each Topical Q0600   Chlorhexidine Gluconate Cloth  6 each Topical Q0600   Chlorhexidine Gluconate Cloth  6 each Topical Q0600   darbepoetin (ARANESP) injection - DIALYSIS  100 mcg Subcutaneous Q Mon-1800   docusate sodium  100 mg Oral BID   feeding supplement  237 mL Oral BID BM   gabapentin  100 mg Oral TID   guaiFENesin  600 mg Oral BID   multivitamin  1 tablet Oral QHS   mupirocin ointment  1 Application Nasal BID   nicotine  21 mg Transdermal Daily   pantoprazole  40 mg Oral BID   revefenacin  175 mcg Nebulization Daily   sodium bicarbonate  1,300 mg Oral TID   sodium chloride flush  3 mL Intravenous Q12H   vancomycin variable dose per unstable renal function (pharmacist dosing)   Does not apply See admin instructions   Continuous Infusions:  sodium  chloride     meropenem (MERREM) IV Stopped (10/28/22 1815)   [START ON 11/01/2022] vancomycin     PRN Meds:.sodium chloride, acetaminophen **OR** acetaminophen, benzonatate, hydrALAZINE, hydrOXYzine, levalbuterol, metoprolol tartrate, ondansetron **OR** ondansetron (ZOFRAN) IV, oxyCODONE-acetaminophen, sodium chloride flush, traZODone  Antimicrobials: Anti-infectives (From admission, onward)    Start     Dose/Rate Route Frequency Ordered Stop   11/01/22 1200  vancomycin (VANCOREADY) IVPB 500 mg/100 mL        500 mg 100 mL/hr over 60 Minutes Intravenous Every M-W-F (Hemodialysis) 10/29/22 1308     10/29/22 1115  vancomycin (VANCOREADY) IVPB 500 mg/100 mL        500 mg 100 mL/hr over 60  Minutes Intravenous  Once 10/29/22 1017 10/29/22 1311   10/28/22 1800  meropenem (MERREM) 500 mg in sodium chloride 0.9 % 100 mL IVPB        500 mg 200 mL/hr over 30 Minutes Intravenous Every 24 hours 10/28/22 1156 11/02/22 1759   10/28/22 1530  vancomycin (VANCOCIN) 500 mg in sodium chloride 0.9 % 100 mL IVPB  Status:  Discontinued        500 mg 105 mL/hr over 60 Minutes Intravenous  Once 10/28/22 1451 10/28/22 1500   10/28/22 1530  vancomycin (VANCOCIN) 500 mg in sodium chloride 0.9 % 100 mL IVPB        500 mg 100 mL/hr over 60 Minutes Intravenous  Once 10/28/22 1501 10/28/22 1748   10/28/22 1430  vancomycin (VANCOREADY) IVPB 500 mg/100 mL  Status:  Discontinued        500 mg 100 mL/hr over 60 Minutes Intravenous  Once 10/28/22 1332 10/28/22 1452   10/28/22 1200  vancomycin (VANCOREADY) IVPB 500 mg/100 mL  Status:  Discontinued        500 mg 100 mL/hr over 60 Minutes Intravenous Once in dialysis 10/28/22 0832 10/28/22 1332   10/26/22 1800  ceFEPIme (MAXIPIME) 1 g in sodium chloride 0.9 % 100 mL IVPB  Status:  Discontinued        1 g 200 mL/hr over 30 Minutes Intravenous Every 24 hours 10/25/22 2111 10/28/22 0733   10/26/22 1530  vancomycin (VANCOREADY) IVPB 500 mg/100 mL        500 mg 100 mL/hr over 60 Minutes Intravenous  Once 10/26/22 1442 10/26/22 1709   10/25/22 2115  vancomycin variable dose per unstable renal function (pharmacist dosing)         Does not apply See admin instructions 10/25/22 2115     10/25/22 1815  ceFEPIme (MAXIPIME) 1 g in sodium chloride 0.9 % 100 mL IVPB        1 g 200 mL/hr over 30 Minutes Intravenous  Once 10/25/22 1806 10/25/22 1925   10/25/22 1800  vancomycin (VANCOCIN) IVPB 1000 mg/200 mL premix        1,000 mg 200 mL/hr over 60 Minutes Intravenous  Once 10/25/22 1757 10/25/22 2003        I have personally reviewed the following labs and images: CBC: Recent Labs  Lab 10/25/22 1351 10/25/22 1354 10/25/22 2053 10/26/22 0622 10/26/22 0806  10/26/22 1816 10/27/22 0032 10/27/22 0911 10/27/22 1340 10/27/22 1738 10/28/22 0032 10/28/22 0741 10/29/22 0110  WBC 12.5*  --  9.1   < > 5.6  --  6.0  --   --   --  7.2 7.9 8.3  NEUTROABS 11.4*  --  8.2*  --   --   --   --   --   --   --   --   --   --  HGB 6.5*   < > 8.1*   < > 7.4*   < > 8.2*   < > 8.3* 9.4* 7.8* 8.8* 7.8*  HCT 19.3*   < > 24.3*   < > 21.2*   < > 24.1*   < > 23.7* 27.7* 23.1* 26.0* 23.4*  MCV 100.0  --  94.9   < > 91.8  --  92.7  --   --   --  90.6 90.6 92.5  PLT 278  --  248   < > 189  --  PLATELET CLUMPS NOTED ON SMEAR, UNABLE TO ESTIMATE  --   --   --  168 192 191   < > = values in this interval not displayed.   BMP &GFR Recent Labs  Lab 10/25/22 1351 10/25/22 1354 10/25/22 2053 10/26/22 0806 10/26/22 1810 10/27/22 0032 10/28/22 0032 10/29/22 0110  NA 128*   < > 130* 130*  --  129* 129* 134*  K 3.5   < > 3.4* 2.8*  --  3.4* 3.6 3.3*  CL 102  --  102 94*  --  91* 94* 96*  CO2 9*  --  11* 22  --  19* 19* 26  GLUCOSE 107*  --  99 223*  --  142* 122* 125*  BUN 75*  --  74* 25*  --  42* 70* 35*  CREATININE 4.80*  --  4.69* 2.24*  --  2.93* 4.21* 2.44*  CALCIUM 8.0*  --  7.8* 7.6*  --  8.0* 7.8* 7.3*  MG 1.6*  --   --   --  1.8 2.0  --  1.6*  PHOS  --   --  5.8*  --   --   --   --  2.7   < > = values in this interval not displayed.   Estimated Creatinine Clearance: 18.1 mL/min (A) (by C-G formula based on SCr of 2.44 mg/dL (H)). Liver & Pancreas: Recent Labs  Lab 10/25/22 1351 10/25/22 2053 10/27/22 0032 10/28/22 0032 10/29/22 0110  AST 23  --  26 33  --   ALT 15  --  13 15  --   ALKPHOS 76  --  55 48  --   BILITOT 0.6  --  0.4 0.7  --   PROT 6.3*  --  5.2* 5.5*  --   ALBUMIN 2.5* 2.4* 1.9* 1.9* 1.8*   Recent Labs  Lab 10/25/22 1351  LIPASE 41   No results for input(s): "AMMONIA" in the last 168 hours. Diabetic: No results for input(s): "HGBA1C" in the last 72 hours.  Recent Labs  Lab 10/28/22 1401 10/28/22 1615 10/28/22 2108  10/29/22 0555 10/29/22 1146  GLUCAP 85 137* 143* 80 142*   Cardiac Enzymes: No results for input(s): "CKTOTAL", "CKMB", "CKMBINDEX", "TROPONINI" in the last 168 hours. No results for input(s): "PROBNP" in the last 8760 hours. Coagulation Profile: No results for input(s): "INR", "PROTIME" in the last 168 hours. Thyroid Function Tests: No results for input(s): "TSH", "T4TOTAL", "FREET4", "T3FREE", "THYROIDAB" in the last 72 hours. Lipid Profile: No results for input(s): "CHOL", "HDL", "LDLCALC", "TRIG", "CHOLHDL", "LDLDIRECT" in the last 72 hours.  Anemia Panel: No results for input(s): "VITAMINB12", "FOLATE", "FERRITIN", "TIBC", "IRON", "RETICCTPCT" in the last 72 hours.  Urine analysis:    Component Value Date/Time   COLORURINE YELLOW 10/25/2022 2015   APPEARANCEUR HAZY (A) 10/25/2022 2015   LABSPEC 1.009 10/25/2022 2015   PHURINE 5.0 10/25/2022 2015   GLUCOSEU 50 (  A) 10/25/2022 2015   HGBUR LARGE (A) 10/25/2022 2015   BILIRUBINUR NEGATIVE 10/25/2022 2015   KETONESUR NEGATIVE 10/25/2022 2015   PROTEINUR 100 (A) 10/25/2022 2015   UROBILINOGEN 0.2 03/02/2010 2027   NITRITE POSITIVE (A) 10/25/2022 2015   LEUKOCYTESUR MODERATE (A) 10/25/2022 2015   Sepsis Labs: Invalid input(s): "PROCALCITONIN", "LACTICIDVEN"  Microbiology: Recent Results (from the past 240 hour(s))  Blood culture (routine x 2)     Status: Abnormal   Collection Time: 10/25/22  1:51 PM   Specimen: BLOOD  Result Value Ref Range Status   Specimen Description BLOOD LEFT ANTECUBITAL  Final   Special Requests   Final    BOTTLES DRAWN AEROBIC ONLY Blood Culture results may not be optimal due to an excessive volume of blood received in culture bottles   Culture  Setup Time   Final    GRAM POSITIVE COCCI GRAM POSITIVE RODS AEROBIC BOTTLE ONLY CRITICAL RESULT CALLED TO, READ BACK BY AND VERIFIED WITH: PHARMD MADISON OWEN [phone] 1656 BY J RAZZAK, MT    Culture (A)  Final    STAPHYLOCOCCUS EPIDERMIDIS THE  SIGNIFICANCE OF ISOLATING THIS ORGANISM FROM A SINGLE SET OF BLOOD CULTURES WHEN MULTIPLE SETS ARE DRAWN IS UNCERTAIN. PLEASE NOTIFY THE MICROBIOLOGY DEPARTMENT WITHIN ONE WEEK IF SPECIATION AND SENSITIVITIES ARE REQUIRED. CORYNEBACTERIUM, GROUP JK Standardized susceptibility testing for this organism is not available. Performed at [facility], [address], [place], [place] [address]    Report Status 10/29/2022 FINAL  Final  Blood Culture ID Panel (Reflexed)     Status: Abnormal   Collection Time: 10/25/22  1:51 PM  Result Value Ref Range Status   Enterococcus faecalis NOT DETECTED NOT DETECTED Final   Enterococcus Faecium NOT DETECTED NOT DETECTED Final   Listeria monocytogenes NOT DETECTED NOT DETECTED Final   Staphylococcus species DETECTED (A) NOT DETECTED Final    Comment: CRITICAL RESULT CALLED TO, READ BACK BY AND VERIFIED WITH: PHARMD MADISON OWEN [phone] 1656 BY J RAZZAK, MT    Staphylococcus aureus (BCID) NOT DETECTED NOT DETECTED Final   Staphylococcus epidermidis DETECTED (A) NOT DETECTED Final    Comment: Methicillin (oxacillin) resistant coagulase negative staphylococcus. Possible blood culture contaminant (unless isolated from more than one blood culture draw or clinical case suggests pathogenicity). No antibiotic treatment is indicated for blood  culture contaminants. CRITICAL RESULT CALLED TO, READ BACK BY AND VERIFIED WITH: PHARMD MADISON Cornelius Moras [id number] 1656 BY J RAZZAK,MT    Staphylococcus lugdunensis NOT DETECTED NOT DETECTED Final   Streptococcus species NOT DETECTED NOT DETECTED Final   Streptococcus agalactiae NOT DETECTED NOT DETECTED Final   Streptococcus pneumoniae NOT DETECTED NOT DETECTED Final   Streptococcus pyogenes NOT DETECTED NOT DETECTED Final   A.calcoaceticus-baumannii NOT DETECTED NOT DETECTED Final   Bacteroides fragilis NOT DETECTED NOT DETECTED Final   Enterobacterales NOT DETECTED NOT DETECTED Final   Enterobacter cloacae complex NOT  DETECTED NOT DETECTED Final   Escherichia coli NOT DETECTED NOT DETECTED Final   Klebsiella aerogenes NOT DETECTED NOT DETECTED Final   Klebsiella oxytoca NOT DETECTED NOT DETECTED Final   Klebsiella pneumoniae NOT DETECTED NOT DETECTED Final   Proteus species NOT DETECTED NOT DETECTED Final   Salmonella species NOT DETECTED NOT DETECTED Final   Serratia marcescens NOT DETECTED NOT DETECTED Final   Haemophilus influenzae NOT DETECTED NOT DETECTED Final   Neisseria meningitidis NOT DETECTED NOT DETECTED Final   Pseudomonas aeruginosa NOT DETECTED NOT DETECTED Final   Stenotrophomonas maltophilia NOT DETECTED NOT DETECTED Final  Candida albicans NOT DETECTED NOT DETECTED Final   Candida auris NOT DETECTED NOT DETECTED Final   Candida glabrata NOT DETECTED NOT DETECTED Final   Candida krusei NOT DETECTED NOT DETECTED Final   Candida parapsilosis NOT DETECTED NOT DETECTED Final   Candida tropicalis NOT DETECTED NOT DETECTED Final   Cryptococcus neoformans/gattii NOT DETECTED NOT DETECTED Final   Methicillin resistance mecA/C DETECTED (A) NOT DETECTED Final    Comment: CRITICAL RESULT CALLED TO, READ BACK BY AND VERIFIED WITH: PHARMD MADISON OWEN [phone] 1656 BY Berline Chough, MT Performed at [facility], [address], [place], [place] [address]   Blood culture (routine x 2)     Status: None (Preliminary result)   Collection Time: 10/25/22  3:03 PM   Specimen: BLOOD LEFT FOREARM  Result Value Ref Range Status   Specimen Description BLOOD LEFT FOREARM  Final   Special Requests   Final    BOTTLES DRAWN AEROBIC AND ANAEROBIC Blood Culture results may not be optimal due to an excessive volume of blood received in culture bottles   Culture   Final    NO GROWTH 4 DAYS Performed at [facility], [address] [place], [place] [address]    Report Status PENDING  Incomplete  Urine Culture     Status: Abnormal   Collection Time: 10/25/22  8:15 PM   Specimen: Urine, Clean Catch   Result Value Ref Range Status   Specimen Description URINE, CLEAN CATCH  Final   Special Requests   Final    NONE Reflexed from [id number] Performed at [facility], [address], [place], [place] [address]    Culture (A)  Final    >=100,000 COLONIES/mL KLEBSIELLA PNEUMONIAE Confirmed Extended Spectrum Beta-Lactamase Producer (ESBL).  In bloodstream infections from ESBL organisms, carbapenems are preferred over piperacillin/tazobactam. They are shown to have a lower risk of mortality.    Report Status 10/27/2022 FINAL  Final   Organism ID, Bacteria KLEBSIELLA PNEUMONIAE (A)  Final      Susceptibility   Klebsiella pneumoniae - MIC*    AMPICILLIN >=32 RESISTANT Resistant     CEFAZOLIN >=64 RESISTANT Resistant     CEFEPIME >=32 RESISTANT Resistant     CEFTRIAXONE >=64 RESISTANT Resistant     CIPROFLOXACIN 0.5 INTERMEDIATE Intermediate     GENTAMICIN >=16 RESISTANT Resistant     IMIPENEM <=0.25 SENSITIVE Sensitive     NITROFURANTOIN 128 RESISTANT Resistant     TRIMETH/SULFA >=320 RESISTANT Resistant     AMPICILLIN/SULBACTAM >=32 RESISTANT Resistant     PIP/TAZO <=4 SENSITIVE Sensitive     * >=100,000 COLONIES/mL KLEBSIELLA PNEUMONIAE  Respiratory (~20 pathogens) panel by PCR     Status: None   Collection Time: 10/25/22  9:07 PM   Specimen: Nasopharyngeal Swab; Respiratory  Result Value Ref Range Status   Adenovirus NOT DETECTED NOT DETECTED Final   Coronavirus 229E NOT DETECTED NOT DETECTED Final    Comment: (NOTE) The Coronavirus on the Respiratory Panel, DOES NOT test for the novel  Coronavirus (2019 nCoV)    Coronavirus HKU1 NOT DETECTED NOT DETECTED Final   Coronavirus NL63 NOT DETECTED NOT DETECTED Final   Coronavirus OC43 NOT DETECTED NOT DETECTED Final   Metapneumovirus NOT DETECTED NOT DETECTED Final   Rhinovirus / Enterovirus NOT DETECTED NOT DETECTED Final   Influenza A NOT DETECTED NOT DETECTED Final   Influenza B NOT DETECTED NOT DETECTED Final   Parainfluenza  Virus 1 NOT DETECTED NOT DETECTED Final  Parainfluenza Virus 2 NOT DETECTED NOT DETECTED Final   Parainfluenza Virus 3 NOT DETECTED NOT DETECTED Final   Parainfluenza Virus 4 NOT DETECTED NOT DETECTED Final   Respiratory Syncytial Virus NOT DETECTED NOT DETECTED Final   Bordetella pertussis NOT DETECTED NOT DETECTED Final   Bordetella Parapertussis NOT DETECTED NOT DETECTED Final   Chlamydophila pneumoniae NOT DETECTED NOT DETECTED Final   Mycoplasma pneumoniae NOT DETECTED NOT DETECTED Final    Comment: Performed at [facility], [address], [place], [place] [address]  MRSA Next Gen by PCR, Nasal     Status: Abnormal   Collection Time: 10/26/22  2:58 AM   Specimen: Nasal Mucosa; Nasal Swab  Result Value Ref Range Status   MRSA by PCR Next Gen DETECTED (A) NOT DETECTED Final    Comment: RESULT CALLED TO, READ BACK BY AND VERIFIED WITH: [facility] RN 10/26/22 @ 0404 BY AB (NOTE) The GeneXpert MRSA Assay (FDA approved for NASAL specimens only), is one component of a comprehensive MRSA colonization surveillance program. It is not intended to diagnose MRSA infection nor to guide or monitor treatment for MRSA infections. Test performance is not FDA approved in patients less than 50 years old. Performed at [facility], [address] [place], [place] [address]     Radiology Studies: No results found.     T.  [facility]  If 7PM-7AM, please contact night-coverage [email or web address] 10/29/2022, 3:10 PM

## 2022-10-30 ENCOUNTER — Inpatient Hospital Stay (HOSPITAL_COMMUNITY): Payer: Medicare Other

## 2022-10-30 DIAGNOSIS — J189 Pneumonia, unspecified organism: Secondary | ICD-10-CM | POA: Diagnosis not present

## 2022-10-30 DIAGNOSIS — Z8774 Personal history of (corrected) congenital malformations of heart and circulatory system: Secondary | ICD-10-CM | POA: Diagnosis not present

## 2022-10-30 DIAGNOSIS — K31819 Angiodysplasia of stomach and duodenum without bleeding: Secondary | ICD-10-CM | POA: Diagnosis not present

## 2022-10-30 DIAGNOSIS — J9601 Acute respiratory failure with hypoxia: Secondary | ICD-10-CM | POA: Diagnosis not present

## 2022-10-30 LAB — CBC
HCT: 25 % — ABNORMAL LOW (ref 36.0–46.0)
Hemoglobin: 8 g/dL — ABNORMAL LOW (ref 12.0–15.0)
MCH: 30.8 pg (ref 26.0–34.0)
MCHC: 32 g/dL (ref 30.0–36.0)
MCV: 96.2 fL (ref 80.0–100.0)
Platelets: 218 10*3/uL (ref 150–400)
RBC: 2.6 MIL/uL — ABNORMAL LOW (ref 3.87–5.11)
RDW: 18.7 % — ABNORMAL HIGH (ref 11.5–15.5)
WBC: 7.9 10*3/uL (ref 4.0–10.5)
nRBC: 0 % (ref 0.0–0.2)

## 2022-10-30 LAB — TROPONIN I (HIGH SENSITIVITY)
Troponin I (High Sensitivity): 93 ng/L — ABNORMAL HIGH (ref <18)
Troponin I (High Sensitivity): 91 ng/L — ABNORMAL HIGH (ref <18)

## 2022-10-30 LAB — HEMOGLOBIN AND HEMATOCRIT, BLOOD
HCT: 25.1 % — ABNORMAL LOW (ref 36.0–46.0)
Hemoglobin: 8.1 g/dL — ABNORMAL LOW (ref 12.0–15.0)

## 2022-10-30 LAB — GLUCOSE, CAPILLARY
Glucose-Capillary: 81 mg/dL (ref 70–99)
Glucose-Capillary: 93 mg/dL (ref 70–99)
Glucose-Capillary: 140 mg/dL — ABNORMAL HIGH (ref 70–99)

## 2022-10-30 MED ORDER — HYDROMORPHONE HCL 1 MG/ML IJ SOLN
0.5000 mg | Freq: Once | INTRAMUSCULAR | Status: AC
  Administered 2022-10-30: 0.5 mg via INTRAVENOUS
  Filled 2022-10-30: qty 0.5

## 2022-10-30 MED ORDER — HEPARIN SODIUM (PORCINE) 1000 UNIT/ML IJ SOLN
INTRAMUSCULAR | Status: AC
  Administered 2022-10-30: 3200 [IU] via INTRAVENOUS_CENTRAL
  Filled 2022-10-30: qty 4

## 2022-10-30 MED ORDER — HYDROXYZINE HCL 25 MG PO TABS
25.0000 mg | ORAL_TABLET | Freq: Three times a day (TID) | ORAL | Status: DC | PRN
  Administered 2022-10-30 – 2022-11-02 (×4): 25 mg via ORAL
  Filled 2022-10-30 (×4): qty 1

## 2022-10-30 NOTE — Progress Notes (Addendum)
Contacted [facility] admissions this morning to request an update on pt's referral. Awaiting final acceptance/schedule at this time. Will assist as needed.   Olivia Canter Renal Navigator [phone]  Addendum at 3:44 pm: Pt has been accepted at [facility] on TTS 11:45 chair time. Pt can start tomorrow and will need to arrive at 11:00 am on first day to complete paperwork prior to treatment. Met with pt at bedside to discuss arrangements. Pt states she has a GI procedure tomorrow and that she still needs to figure out where she will go at d/c. Provided above clinic info to pt and advised pt that once pt figures out where she will go at d/c, then we can see if [facility] will still work for pt. Explained that some clinics are at capacity and that we have to look at what is available. Schedule letter provided to pt as well. Update provided to attending, nephrologist, pt's RN, RN CM, and CSW. Pt requesting mental health services. Made team aware of pt's request. Will assist as needed.

## 2022-10-30 NOTE — Progress Notes (Signed)
[facility] Progress Note   Subjective:   Patient has no complaints today.  Tolerating dialysis.  Objective Vitals:   10/30/22 0900 10/30/22 0930 10/30/22 1000 10/30/22 1030  BP: 138/76 132/63 (!) 109/59 (!) 117/57  Pulse: 82 82 83 79  Resp: (!) 23 (!) 23 14 15   Temp:      TempSrc:      SpO2: 100% 100% 98% 99%  Weight:      Height:       Physical Exam General: Alert female, chronically ill appearing, NAD Heart: Normal rate, no rub Lungs: Bilateral chest rise with no increased work of breathing Abdomen: Soft, non-distended, +BS Extremities: No edema b/l lower extremities Dialysis Access:  TDC, RUE AVF +bruit  Additional Objective Labs: Basic Metabolic Panel: Recent Labs  Lab 10/25/22 2053 10/26/22 0806 10/28/22 0032 10/29/22 0110 10/30/22 0312  NA 130*   < > 129* 134* 134*  K 3.4*   < > 3.6 3.3* 4.3  CL 102   < > 94* 96* 97*  CO2 11*   < > 19* 26 24  GLUCOSE 99   < > 122* 125* 72  BUN 74*   < > 70* 35* 56*  CREATININE 4.69*   < > 4.21* 2.44* 3.25*  CALCIUM 7.8*   < > 7.8* 7.3* 7.4*  PHOS 5.8*  --   --  2.7 3.1   < > = values in this interval not displayed.   Liver Function Tests: Recent Labs  Lab 10/25/22 1351 10/25/22 2053 10/27/22 0032 10/28/22 0032 10/29/22 0110 10/30/22 0312  AST 23  --  26 33  --   --   ALT 15  --  13 15  --   --   ALKPHOS 76  --  55 48  --   --   BILITOT 0.6  --  0.4 0.7  --   --   PROT 6.3*  --  5.2* 5.5*  --   --   ALBUMIN 2.5*   < > 1.9* 1.9* 1.8* 1.8*   < > = values in this interval not displayed.   Recent Labs  Lab 10/25/22 1351  LIPASE 41   CBC: Recent Labs  Lab 10/25/22 1351 10/25/22 1354 10/25/22 2053 10/26/22 0622 10/27/22 0032 10/27/22 0911 10/28/22 0032 10/28/22 0741 10/29/22 0110 10/30/22 0804 10/30/22 0904  WBC 12.5*  --  9.1   < > 6.0  --  7.2 7.9 8.3 7.9  --   NEUTROABS 11.4*  --  8.2*  --   --   --   --   --   --   --   --   HGB 6.5*   < > 8.1*   < > 8.2*   < > 7.8* 8.8* 7.8*  8.0* 8.1*  HCT 19.3*   < > 24.3*   < > 24.1*   < > 23.1* 26.0* 23.4* 25.0* 25.1*  MCV 100.0  --  94.9   < > 92.7  --  90.6 90.6 92.5 96.2  --   PLT 278  --  248   < > PLATELET CLUMPS NOTED ON SMEAR, UNABLE TO ESTIMATE  --  168 192 191 218  --    < > = values in this interval not displayed.   Blood Culture    Component Value Date/Time   SDES URINE, CLEAN CATCH 10/25/2022 2015   SPECREQUEST  10/25/2022 2015    NONE Reflexed from [id number] Performed at [facility], [address] [place], [place] [address]    CULT (A) 10/25/2022 2015    >=100,000 COLONIES/mL KLEBSIELLA PNEUMONIAE Confirmed Extended Spectrum Beta-Lactamase Producer (ESBL).  In bloodstream infections from ESBL organisms, carbapenems are preferred over piperacillin/tazobactam. They are shown to have a lower risk of mortality.    REPTSTATUS 10/27/2022 FINAL 10/25/2022 2015    Cardiac Enzymes: No results for input(s): "CKTOTAL", "CKMB", "CKMBINDEX", "TROPONINI" in the last 168 hours. CBG: Recent Labs  Lab 10/29/22 0555 10/29/22 1146 10/29/22 1618 10/29/22 2116 10/30/22 0612  GLUCAP 80 142* 142* 133* 81   Iron Studies:  No results for input(s): "IRON", "TIBC", "TRANSFERRIN", "FERRITIN" in the last 72 hours.  @lablastinr3 @ Studies/Results: No results found. Medications:  sodium chloride     meropenem (MERREM) IV 200 mL/hr at 10/29/22 2341   [START ON 11/01/2022] vancomycin      sodium chloride   Intravenous Once   sodium chloride   Intravenous Once   arformoterol  15 mcg Nebulization BID   atorvastatin  10 mg Oral Daily   budesonide (PULMICORT) nebulizer solution  0.5 mg Nebulization BID   busPIRone  10 mg Oral BID   calcitRIOL  0.5 mcg Oral Q M,W,F-HD   Chlorhexidine Gluconate Cloth  6 each Topical Q0600   darbepoetin (ARANESP) injection - DIALYSIS  100 mcg Subcutaneous Q Mon-1800   docusate sodium  100 mg Oral BID   feeding supplement  237 mL Oral BID BM   gabapentin  100 mg Oral TID   guaiFENesin   600 mg Oral BID   heparin sodium (porcine)       multivitamin  1 tablet Oral QHS   mupirocin ointment  1 Application Nasal BID   nicotine  21 mg Transdermal Daily   pantoprazole  40 mg Oral BID   revefenacin  175 mcg Nebulization Daily   sodium bicarbonate  1,300 mg Oral TID   sodium chloride flush  3 mL Intravenous Q12H   vancomycin variable dose per unstable renal function (pharmacist dosing)   Does not apply See admin instructions    OP Dialysis Orders: Previously at [facility], unable to obtain records given she was discharged  Assessment/Plan: Uremia/ESRD- She reports she missed 1 month of dialysis but was feeling well until this week.  Requirements for dialysis on arrival were acidosis and volume overload.  Maintain MWF schedule for now. Trying to get established back in outpatient dialysis with social work.  Mostly dependent on where she will live after the hospital Acute hypoxic respiratory failure: Possibly pneumonia but also volume overloaded.  Has improved.  Ultrafiltration with dialysis. Metabolic acidosis: Related to renal failure.  Improved with dialysis and supplementation HTN/ volume -improved with dialysis and volume removal Anemia esrd - Hb ~8 today. s/p PRBC transfusion. GI onboard. ESA ordered here. Iron very low but hard to interpret given she had transfusion. Holding iron given possible infection. Plans for EGD at some point per GI. 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

## 2022-10-30 NOTE — Progress Notes (Addendum)
Patient stating having 10/10 chest/back pain. She states it hurts when she breathes in. Called Respiratory therapist for scheduled breathing tx she missed in the morning due to her being in HD. Also paged Attending for further recommendations. Gave patient tylenol and hydroxyzine for her nerves.    Update 1700- Attending at bedside and looked at EKG and ordered CXR,lab work and pain medication. Will reassess

## 2022-10-30 NOTE — Progress Notes (Signed)
   10/30/22 1215  Vitals  Temp 97.9 F (36.6 C)  Pulse Rate 82  Resp 18  BP 123/64  SpO2 100 %  O2 Device Room Air  Type of Weight Post-Dialysis  Oxygen Therapy  Patient Activity (if Appropriate) In bed  Pulse Oximetry Type Continuous  Oximetry Probe Site Changed No  Post Treatment  Dialyzer Clearance Lightly streaked  Hemodialysis Intake (mL) 0 mL  Liters Processed 84  Fluid Removed (mL) 2000 mL  Tolerated HD Treatment Yes   Received patient in bed to unit.  Alert and oriented.  Informed consent signed and in chart.   TX duration:3.5  Patient tolerated well.  Transported back to the room  Alert, without acute distress.  Hand-off given to patient's nurse.   Access used: [facility] Access issues: no complications  Total UF removed: 2000 Medication(s) given: none   [facility]

## 2022-10-30 NOTE — Progress Notes (Signed)
PROGRESS NOTE  [name] IRC:[id number] DOB: 03-26-1960   PCP: [name], MD  Patient is from: Homeless  DOA: 10/25/2022 LOS: 5  Chief complaints Chief Complaint  Patient presents with   Shortness of Breath     Brief Narrative / Interim history: 62 year old F with PMH of ESRD noncompliant with HD (missed HD for over a month), COPD, PAD, GERD, GIB, AVM, anemia, HLD, hep C, tobacco use disorder and homelessness presenting with shortness of breath and chest pain, and admitted for COPD exacerbation and sepsis due to bilateral pneumonia.  Started on steroid, antibiotics and nebulizers.  Urine culture with ESBL Klebsiella.  She was initially on IV cefepime and switched to IV meropenem after urine culture sensitivity.  Hemoglobin was 5.5 on admission.  Hemoccult was positive.  She was transfused 2 units with improvement.    Plan for enteroscopy by GI on 8/15.   Outpatient HD complicated by homelessness   Subjective: Seen and examined earlier this this evening.  Reports severe chest pain that she describes as stabbing and radiating to her back.  Pain started during dialysis.  No other associated symptoms.  Pain is worse with movement and coughing.  Also reports cramping in the legs that has improved.  Objective: Vitals:   10/30/22 1200 10/30/22 1210 10/30/22 1215 10/30/22 1604  BP: (!) 170/51 121/78 123/64 132/69  Pulse: 85 80 82 74  Resp: 15 18 18  (!) 24  Temp:   97.9 F (36.6 C) 97.8 F (36.6 C)  TempSrc:    Oral  SpO2: 98% 100% 100%   Weight:      Height:        Examination:  GENERAL: Appears frail.  In pain? HEENT: MMM.  Vision and hearing grossly intact.  NECK: Supple.  No apparent JVD.  RESP:  No IWOB.  Fair aeration bilaterally.  Rhonchi bilaterally. CVS:  RRR. Heart sounds normal.  ABD/GI/GU: BS+. Abd soft, NTND.  MSK/EXT:  Moves extremities.  Significant muscle mass and subcu fat loss.  2+ DP and radial pulses bilaterally SKIN: no apparent skin lesion or  wound NEURO: Awake, alert and oriented appropriately.  No apparent focal neuro deficit. PSYCH: In pain?  Procedures:  None  Microbiology summarized: 8/9-full RVP nonreactive 8/10-MRSA PCR screen positive 8/9-blood culture with staph epidermis with methicillin resistance in 1 out of 4 bottles. 8/9-urine culture with ESBL Klebsiella pneumonia.  Assessment and plan: Principal Problem:   Right lower lobe pneumonia Active Problems:   Metabolic acidosis   Chronic hyponatremia   Acute on chronic blood loss anemia   Acute hypoxic respiratory failure (HCC)   ESRD on dialysis-noncompliant with outpatient dialysis due to homeless [facility])   History of arteriovenous malformation (AVM)   COPD with acute exacerbation (HCC)   SIRS (systemic inflammatory response syndrome) (HCC)   Essential hypertension   Hepatitis C   Chronic disease anemia   GERD (gastroesophageal reflux disease)   Chronic diastolic CHF (congestive heart failure) (HCC)   Premature atrial complex due to COPD exacerbation and acute hypoxic respiratory failure   Chronic pain syndrome   Angiodysplasia of upper gastrointestinal tract   Protein-calorie malnutrition, severe  Sepsis secondary to community-acquired pneumonia, bilateral pneumonia: Culture data as above. -Continue vancomycin with HD for total of 7 days -Changed cefepime to meropenem on 8/12 given ESBL Klebsiella pneumonia in urine. -Continue scheduled and as needed nebulizers nebulizers and pulmonary toilet.  COPD exacerbation: Still with significant cough.  Smokes about half to 1 pack a day.  Doubt compliance with inhalers. -Completed 5 days of Solu-Medrol. -Antibiotics as above -Continue Brovana, Pulmicort and Yupelri -Continue Xopenex as needed -Continue mucolytic's and antitussive -Encouraged smoking cessation. -Atarax for anxiety-increase to 25  Atypical chest pain: EKG shows A-fib without RVR.  Hemodynamically stable.  On room air.  She has symmetric  pulses.  Psychogenic? -Check troponin and CXR. -IV Dilaudid 0.5 mg x 1 -Increase Atarax to 25 mg 3 times daily as needed -Continue PPI -At risk for VTE without VTE prophylaxis due to GI bleed but system not consistent.   Acute hypoxic respiratory failure: Required 2 L at some point.  Resolved. -Manage pneumonia and COPD as above   ESBL UTI: Urine culture with ESBL Klebsiella pneumonia. -Change IV cefepime to meropenem.  Treat for 3 days.  ESRD/metabolic acidosis/hyponatremia: Reportedly has not had dialysis in over a month.  She is homeless and has no phone.  Makes urine.  Seems like she is on Lasix at home. -Nephrology on board   Positive blood culture: Blood culture with staph epidermis with methicillin-resistant in 1 out of 4 bottles likely contaminant.   Acute on chronic blood loss anemia/GI bleed: history of recurrent GI bleed, history of AVM: H&H stable after 2 units. Recent Labs    10/26/22 1816 10/27/22 0032 10/27/22 0911 10/27/22 1340 10/27/22 1738 10/28/22 0032 10/28/22 0741 10/29/22 0110 10/30/22 0804 10/30/22 0904  HGB 7.8* 8.2* 8.6* 8.3* 9.4* 7.8* 8.8* 7.8* 8.0* 8.1*  -Continue monitoring -Aranesp per nephrology -Continue Protonix -Plan for enteroscopy on 8/15 by GI.  Anxiety: Reports feeling anxious.  Could be exacerbated by steroid.  Anticipate improvement of steroid. -Continue gabapentin and BuSpar -Increase Atarax to 25 mg 3 times daily with meals   Tobacco use disorder: Reports cutting down to half a pack a day recently due to shortness of breath. -Encourage cessation -Nicotine patch  Hypokalemia:  -Monitor and replenish as appropriate.  Homelessness: Complicating disposition and outpatient dialysis chair. -TOC on board.  Severe malnutrition: POA Body mass index is 18.09 kg/m. Nutrition Problem: Severe Malnutrition Etiology: chronic illness (ESRD on HD, COPD) Signs/Symptoms: severe fat depletion, severe muscle depletion, percent weight  loss Percent weight loss: 23 % (over 1 year) Interventions: Refer to RD note for recommendations   DVT prophylaxis:  SCDs Start: 10/25/22 2022  Code Status: Full code Family Communication: None at bedside Level of care: Telemetry Medical Status is: Inpatient Remains inpatient appropriate because: Pneumonia, COPD exacerbation, GI bleed and ESRD   Final disposition: TBD Consultants:  Nephrology Gastroenterology  55 minutes with more than 50% spent in reviewing records, counseling patient/family and coordinating care.   Sch Meds:  Scheduled Meds:  sodium chloride   Intravenous Once   sodium chloride   Intravenous Once   arformoterol  15 mcg Nebulization BID   atorvastatin  10 mg Oral Daily   budesonide (PULMICORT) nebulizer solution  0.5 mg Nebulization BID   busPIRone  10 mg Oral BID   calcitRIOL  0.5 mcg Oral Q M,W,F-HD   Chlorhexidine Gluconate Cloth  6 each Topical Q0600   darbepoetin (ARANESP) injection - DIALYSIS  100 mcg Subcutaneous Q Mon-1800   docusate sodium  100 mg Oral BID   feeding supplement  237 mL Oral BID BM   gabapentin  100 mg Oral TID   guaiFENesin  600 mg Oral BID   multivitamin  1 tablet Oral QHS   mupirocin ointment  1 Application Nasal BID   nicotine  21 mg Transdermal Daily   pantoprazole  40  mg Oral BID   revefenacin  175 mcg Nebulization Daily   sodium bicarbonate  1,300 mg Oral TID   sodium chloride flush  3 mL Intravenous Q12H   Continuous Infusions:  sodium chloride     meropenem (MERREM) IV 500 mg (10/30/22 1754)   [START ON 11/01/2022] vancomycin     PRN Meds:.sodium chloride, acetaminophen **OR** acetaminophen, benzonatate, hydrALAZINE, hydrOXYzine, levalbuterol, metoprolol tartrate, ondansetron **OR** ondansetron (ZOFRAN) IV, oxyCODONE-acetaminophen, sodium chloride flush, traZODone  Antimicrobials: Anti-infectives (From admission, onward)    Start     Dose/Rate Route Frequency Ordered Stop   11/01/22 1200  vancomycin (VANCOREADY)  IVPB 500 mg/100 mL        500 mg 100 mL/hr over 60 Minutes Intravenous Every M-W-F (Hemodialysis) 10/29/22 1308 11/01/22 2359   10/29/22 1115  vancomycin (VANCOREADY) IVPB 500 mg/100 mL        500 mg 100 mL/hr over 60 Minutes Intravenous  Once 10/29/22 1017 10/29/22 1311   10/28/22 1800  meropenem (MERREM) 500 mg in sodium chloride 0.9 % 100 mL IVPB        500 mg 200 mL/hr over 30 Minutes Intravenous Every 24 hours 10/28/22 1156 11/02/22 1759   10/28/22 1530  vancomycin (VANCOCIN) 500 mg in sodium chloride 0.9 % 100 mL IVPB  Status:  Discontinued        500 mg 105 mL/hr over 60 Minutes Intravenous  Once 10/28/22 1451 10/28/22 1500   10/28/22 1530  vancomycin (VANCOCIN) 500 mg in sodium chloride 0.9 % 100 mL IVPB        500 mg 100 mL/hr over 60 Minutes Intravenous  Once 10/28/22 1501 10/28/22 1748   10/28/22 1430  vancomycin (VANCOREADY) IVPB 500 mg/100 mL  Status:  Discontinued        500 mg 100 mL/hr over 60 Minutes Intravenous  Once 10/28/22 1332 10/28/22 1452   10/28/22 1200  vancomycin (VANCOREADY) IVPB 500 mg/100 mL  Status:  Discontinued        500 mg 100 mL/hr over 60 Minutes Intravenous Once in dialysis 10/28/22 0832 10/28/22 1332   10/26/22 1800  ceFEPIme (MAXIPIME) 1 g in sodium chloride 0.9 % 100 mL IVPB  Status:  Discontinued        1 g 200 mL/hr over 30 Minutes Intravenous Every 24 hours 10/25/22 2111 10/28/22 0733   10/26/22 1530  vancomycin (VANCOREADY) IVPB 500 mg/100 mL        500 mg 100 mL/hr over 60 Minutes Intravenous  Once 10/26/22 1442 10/26/22 1709   10/25/22 2115  vancomycin variable dose per unstable renal function (pharmacist dosing)  Status:  Discontinued         Does not apply See admin instructions 10/25/22 2115 10/30/22 1347   10/25/22 1815  ceFEPIme (MAXIPIME) 1 g in sodium chloride 0.9 % 100 mL IVPB        1 g 200 mL/hr over 30 Minutes Intravenous  Once 10/25/22 1806 10/25/22 1925   10/25/22 1800  vancomycin (VANCOCIN) IVPB 1000 mg/200 mL premix         1,000 mg 200 mL/hr over 60 Minutes Intravenous  Once 10/25/22 1757 10/25/22 2003        I have personally reviewed the following labs and images: CBC: Recent Labs  Lab 10/25/22 1351 10/25/22 1354 10/25/22 2053 10/26/22 0622 10/27/22 0032 10/27/22 0911 10/28/22 0032 10/28/22 0741 10/29/22 0110 10/30/22 0804 10/30/22 0904  WBC 12.5*  --  9.1   < > 6.0  --  7.2 7.9  8.3 7.9  --   NEUTROABS 11.4*  --  8.2*  --   --   --   --   --   --   --   --   HGB 6.5*   < > 8.1*   < > 8.2*   < > 7.8* 8.8* 7.8* 8.0* 8.1*  HCT 19.3*   < > 24.3*   < > 24.1*   < > 23.1* 26.0* 23.4* 25.0* 25.1*  MCV 100.0  --  94.9   < > 92.7  --  90.6 90.6 92.5 96.2  --   PLT 278  --  248   < > PLATELET CLUMPS NOTED ON SMEAR, UNABLE TO ESTIMATE  --  168 192 191 218  --    < > = values in this interval not displayed.   BMP &GFR Recent Labs  Lab 10/25/22 1351 10/25/22 1354 10/25/22 2053 10/26/22 0806 10/26/22 1810 10/27/22 0032 10/28/22 0032 10/29/22 0110 10/30/22 0312  NA 128*   < > 130* 130*  --  129* 129* 134* 134*  K 3.5   < > 3.4* 2.8*  --  3.4* 3.6 3.3* 4.3  CL 102  --  102 94*  --  91* 94* 96* 97*  CO2 9*  --  11* 22  --  19* 19* 26 24  GLUCOSE 107*  --  99 223*  --  142* 122* 125* 72  BUN 75*  --  74* 25*  --  42* 70* 35* 56*  CREATININE 4.80*  --  4.69* 2.24*  --  2.93* 4.21* 2.44* 3.25*  CALCIUM 8.0*  --  7.8* 7.6*  --  8.0* 7.8* 7.3* 7.4*  MG 1.6*  --   --   --  1.8 2.0  --  1.6* 1.9  PHOS  --   --  5.8*  --   --   --   --  2.7 3.1   < > = values in this interval not displayed.   Estimated Creatinine Clearance: 14.8 mL/min (A) (by C-G formula based on SCr of 3.25 mg/dL (H)). Liver & Pancreas: Recent Labs  Lab 10/25/22 1351 10/25/22 2053 10/27/22 0032 10/28/22 0032 10/29/22 0110 10/30/22 0312  AST 23  --  26 33  --   --   ALT 15  --  13 15  --   --   ALKPHOS 76  --  55 48  --   --   BILITOT 0.6  --  0.4 0.7  --   --   PROT 6.3*  --  5.2* 5.5*  --   --   ALBUMIN 2.5* 2.4* 1.9*  1.9* 1.8* 1.8*   Recent Labs  Lab 10/25/22 1351  LIPASE 41   No results for input(s): "AMMONIA" in the last 168 hours. Diabetic: No results for input(s): "HGBA1C" in the last 72 hours.  Recent Labs  Lab 10/29/22 1146 10/29/22 1618 10/29/22 2116 10/30/22 0612 10/30/22 1604  GLUCAP 142* 142* 133* 81 93   Cardiac Enzymes: No results for input(s): "CKTOTAL", "CKMB", "CKMBINDEX", "TROPONINI" in the last 168 hours. No results for input(s): "PROBNP" in the last 8760 hours. Coagulation Profile: No results for input(s): "INR", "PROTIME" in the last 168 hours. Thyroid Function Tests: No results for input(s): "TSH", "T4TOTAL", "FREET4", "T3FREE", "THYROIDAB" in the last 72 hours. Lipid Profile: No results for input(s): "CHOL", "HDL", "LDLCALC", "TRIG", "CHOLHDL", "LDLDIRECT" in the last 72 hours.  Anemia Panel: No results for input(s): "VITAMINB12", "FOLATE", "FERRITIN", "TIBC", "  IRON", "RETICCTPCT" in the last 72 hours.  Urine analysis:    Component Value Date/Time   COLORURINE YELLOW 10/25/2022 2015   APPEARANCEUR HAZY (A) 10/25/2022 2015   LABSPEC 1.009 10/25/2022 2015   PHURINE 5.0 10/25/2022 2015   GLUCOSEU 50 (A) 10/25/2022 2015   HGBUR LARGE (A) 10/25/2022 2015   BILIRUBINUR NEGATIVE 10/25/2022 2015   KETONESUR NEGATIVE 10/25/2022 2015   PROTEINUR 100 (A) 10/25/2022 2015   UROBILINOGEN 0.2 03/02/2010 2027   NITRITE POSITIVE (A) 10/25/2022 2015   LEUKOCYTESUR MODERATE (A) 10/25/2022 2015   Sepsis Labs: Invalid input(s): "PROCALCITONIN", "LACTICIDVEN"  Microbiology: Recent Results (from the past 240 hour(s))  Blood culture (routine x 2)     Status: Abnormal   Collection Time: 10/25/22  1:51 PM   Specimen: BLOOD  Result Value Ref Range Status   Specimen Description BLOOD LEFT ANTECUBITAL  Final   Special Requests   Final    BOTTLES DRAWN AEROBIC ONLY Blood Culture results may not be optimal due to an excessive volume of blood received in culture bottles   Culture   Setup Time   Final    GRAM POSITIVE COCCI GRAM POSITIVE RODS AEROBIC BOTTLE ONLY CRITICAL RESULT CALLED TO, READ BACK BY AND VERIFIED WITH: PHARMD MADISON OWEN [id number] 1656 BY J RAZZAK, MT    Culture (A)  Final    STAPHYLOCOCCUS EPIDERMIDIS THE SIGNIFICANCE OF ISOLATING THIS ORGANISM FROM A SINGLE SET OF BLOOD CULTURES WHEN MULTIPLE SETS ARE DRAWN IS UNCERTAIN. PLEASE NOTIFY THE MICROBIOLOGY DEPARTMENT WITHIN ONE WEEK IF SPECIATION AND SENSITIVITIES ARE REQUIRED. CORYNEBACTERIUM, GROUP JK Standardized susceptibility testing for this organism is not available. Performed at [facility], [address] [place], [place] [address]    Report Status 10/29/2022 FINAL  Final  Blood Culture ID Panel (Reflexed)     Status: Abnormal   Collection Time: 10/25/22  1:51 PM  Result Value Ref Range Status   Enterococcus faecalis NOT DETECTED NOT DETECTED Final   Enterococcus Faecium NOT DETECTED NOT DETECTED Final   Listeria monocytogenes NOT DETECTED NOT DETECTED Final   Staphylococcus species DETECTED (A) NOT DETECTED Final    Comment: CRITICAL RESULT CALLED TO, READ BACK BY AND VERIFIED WITH: PHARMD MADISON OWEN [phone] 1656 BY J RAZZAK, MT    Staphylococcus aureus (BCID) NOT DETECTED NOT DETECTED Final   Staphylococcus epidermidis DETECTED (A) NOT DETECTED Final    Comment: Methicillin (oxacillin) resistant coagulase negative staphylococcus. Possible blood culture contaminant (unless isolated from more than one blood culture draw or clinical case suggests pathogenicity). No antibiotic treatment is indicated for blood  culture contaminants. CRITICAL RESULT CALLED TO, READ BACK BY AND VERIFIED WITH: PHARMD MADISON Cornelius Moras [phone] 1656 BY J RAZZAK,MT    Staphylococcus lugdunensis NOT DETECTED NOT DETECTED Final   Streptococcus species NOT DETECTED NOT DETECTED Final   Streptococcus agalactiae NOT DETECTED NOT DETECTED Final   Streptococcus pneumoniae NOT DETECTED NOT DETECTED Final    Streptococcus pyogenes NOT DETECTED NOT DETECTED Final   A.calcoaceticus-baumannii NOT DETECTED NOT DETECTED Final   Bacteroides fragilis NOT DETECTED NOT DETECTED Final   Enterobacterales NOT DETECTED NOT DETECTED Final   Enterobacter cloacae complex NOT DETECTED NOT DETECTED Final   Escherichia coli NOT DETECTED NOT DETECTED Final   Klebsiella aerogenes NOT DETECTED NOT DETECTED Final   Klebsiella oxytoca NOT DETECTED NOT DETECTED Final   Klebsiella pneumoniae NOT DETECTED NOT DETECTED Final   Proteus species NOT DETECTED NOT DETECTED Final   Salmonella species NOT DETECTED NOT DETECTED Final  Serratia marcescens NOT DETECTED NOT DETECTED Final   Haemophilus influenzae NOT DETECTED NOT DETECTED Final   Neisseria meningitidis NOT DETECTED NOT DETECTED Final   Pseudomonas aeruginosa NOT DETECTED NOT DETECTED Final   Stenotrophomonas maltophilia NOT DETECTED NOT DETECTED Final   Candida albicans NOT DETECTED NOT DETECTED Final   Candida auris NOT DETECTED NOT DETECTED Final   Candida glabrata NOT DETECTED NOT DETECTED Final   Candida krusei NOT DETECTED NOT DETECTED Final   Candida parapsilosis NOT DETECTED NOT DETECTED Final   Candida tropicalis NOT DETECTED NOT DETECTED Final   Cryptococcus neoformans/gattii NOT DETECTED NOT DETECTED Final   Methicillin resistance mecA/C DETECTED (A) NOT DETECTED Final    Comment: CRITICAL RESULT CALLED TO, READ BACK BY AND VERIFIED WITH: PHARMD MADISON OWEN [id number] 1656 BY Berline Chough, MT Performed at [facility], [address], [place], [place] [address]   Blood culture (routine x 2)     Status: None   Collection Time: 10/25/22  3:03 PM   Specimen: BLOOD LEFT FOREARM  Result Value Ref Range Status   Specimen Description BLOOD LEFT FOREARM  Final   Special Requests   Final    BOTTLES DRAWN AEROBIC AND ANAEROBIC Blood Culture results may not be optimal due to an excessive volume of blood received in culture bottles   Culture   Final    NO  GROWTH 5 DAYS Performed at [facility], [address], [place], [place] [address]    Report Status 10/30/2022 FINAL  Final  Urine Culture     Status: Abnormal   Collection Time: 10/25/22  8:15 PM   Specimen: Urine, Clean Catch  Result Value Ref Range Status   Specimen Description URINE, CLEAN CATCH  Final   Special Requests   Final    NONE Reflexed from [id number] Performed at [facility], [address], [place], [place] [address]    Culture (A)  Final    >=100,000 COLONIES/mL KLEBSIELLA PNEUMONIAE Confirmed Extended Spectrum Beta-Lactamase Producer (ESBL).  In bloodstream infections from ESBL organisms, carbapenems are preferred over piperacillin/tazobactam. They are shown to have a lower risk of mortality.    Report Status 10/27/2022 FINAL  Final   Organism ID, Bacteria KLEBSIELLA PNEUMONIAE (A)  Final      Susceptibility   Klebsiella pneumoniae - MIC*    AMPICILLIN >=32 RESISTANT Resistant     CEFAZOLIN >=64 RESISTANT Resistant     CEFEPIME >=32 RESISTANT Resistant     CEFTRIAXONE >=64 RESISTANT Resistant     CIPROFLOXACIN 0.5 INTERMEDIATE Intermediate     GENTAMICIN >=16 RESISTANT Resistant     IMIPENEM <=0.25 SENSITIVE Sensitive     NITROFURANTOIN 128 RESISTANT Resistant     TRIMETH/SULFA >=320 RESISTANT Resistant     AMPICILLIN/SULBACTAM >=32 RESISTANT Resistant     PIP/TAZO <=4 SENSITIVE Sensitive     * >=100,000 COLONIES/mL KLEBSIELLA PNEUMONIAE  Respiratory (~20 pathogens) panel by PCR     Status: None   Collection Time: 10/25/22  9:07 PM   Specimen: Nasopharyngeal Swab; Respiratory  Result Value Ref Range Status   Adenovirus NOT DETECTED NOT DETECTED Final   Coronavirus 229E NOT DETECTED NOT DETECTED Final    Comment: (NOTE) The Coronavirus on the Respiratory Panel, DOES NOT test for the novel  Coronavirus (2019 nCoV)    Coronavirus HKU1 NOT DETECTED NOT DETECTED Final   Coronavirus NL63 NOT DETECTED NOT DETECTED Final   Coronavirus OC43 NOT DETECTED  NOT DETECTED Final   Metapneumovirus  NOT DETECTED NOT DETECTED Final   Rhinovirus / Enterovirus NOT DETECTED NOT DETECTED Final   Influenza A NOT DETECTED NOT DETECTED Final   Influenza B NOT DETECTED NOT DETECTED Final   Parainfluenza Virus 1 NOT DETECTED NOT DETECTED Final   Parainfluenza Virus 2 NOT DETECTED NOT DETECTED Final   Parainfluenza Virus 3 NOT DETECTED NOT DETECTED Final   Parainfluenza Virus 4 NOT DETECTED NOT DETECTED Final   Respiratory Syncytial Virus NOT DETECTED NOT DETECTED Final   Bordetella pertussis NOT DETECTED NOT DETECTED Final   Bordetella Parapertussis NOT DETECTED NOT DETECTED Final   Chlamydophila pneumoniae NOT DETECTED NOT DETECTED Final   Mycoplasma pneumoniae NOT DETECTED NOT DETECTED Final    Comment: Performed at [facility], [address] [place], [place] [address]  MRSA Next Gen by PCR, Nasal     Status: Abnormal   Collection Time: 10/26/22  2:58 AM   Specimen: Nasal Mucosa; Nasal Swab  Result Value Ref Range Status   MRSA by PCR Next Gen DETECTED (A) NOT DETECTED Final    Comment: RESULT CALLED TO, READ BACK BY AND VERIFIED WITH: S. [facility] RN 10/26/22 @ 0404 BY AB (NOTE) The GeneXpert MRSA Assay (FDA approved for NASAL specimens only), is one component of a comprehensive MRSA colonization surveillance program. It is not intended to diagnose MRSA infection nor to guide or monitor treatment for MRSA infections. Test performance is not FDA approved in patients less than 6 years old. Performed at [facility], [address] [place], [place] [address]     Radiology Studies: No results found.     T.  [facility]  If 7PM-7AM, please contact night-coverage [email or web address] 10/30/2022, 5:58 PM

## 2022-10-30 NOTE — Procedures (Signed)
I was present at this dialysis session. I have reviewed the session itself and made appropriate changes.   Filed Weights   10/29/22 0448 10/30/22 0318 10/30/22 0759  Weight: 48 kg 50.5 kg 52.4 kg    Recent Labs  Lab 10/30/22 0312  NA 134*  K 4.3  CL 97*  CO2 24  GLUCOSE 72  BUN 56*  CREATININE 3.25*  CALCIUM 7.4*  PHOS 3.1    Recent Labs  Lab 10/25/22 1351 10/25/22 1354 10/25/22 2053 10/26/22 0622 10/28/22 0741 10/29/22 0110 10/30/22 0804 10/30/22 0904  WBC 12.5*  --  9.1   < > 7.9 8.3 7.9  --   NEUTROABS 11.4*  --  8.2*  --   --   --   --   --   HGB 6.5*   < > 8.1*   < > 8.8* 7.8* 8.0* 8.1*  HCT 19.3*   < > 24.3*   < > 26.0* 23.4* 25.0* 25.1*  MCV 100.0  --  94.9   < > 90.6 92.5 96.2  --   PLT 278  --  248   < > 192 191 218  --    < > = values in this interval not displayed.    Scheduled Meds:  sodium chloride   Intravenous Once   sodium chloride   Intravenous Once   arformoterol  15 mcg Nebulization BID   atorvastatin  10 mg Oral Daily   budesonide (PULMICORT) nebulizer solution  0.5 mg Nebulization BID   busPIRone  10 mg Oral BID   calcitRIOL  0.5 mcg Oral Q M,W,F-HD   Chlorhexidine Gluconate Cloth  6 each Topical Q0600   darbepoetin (ARANESP) injection - DIALYSIS  100 mcg Subcutaneous Q Mon-1800   docusate sodium  100 mg Oral BID   feeding supplement  237 mL Oral BID BM   gabapentin  100 mg Oral TID   guaiFENesin  600 mg Oral BID   heparin sodium (porcine)       multivitamin  1 tablet Oral QHS   mupirocin ointment  1 Application Nasal BID   nicotine  21 mg Transdermal Daily   pantoprazole  40 mg Oral BID   revefenacin  175 mcg Nebulization Daily   sodium bicarbonate  1,300 mg Oral TID   sodium chloride flush  3 mL Intravenous Q12H   vancomycin variable dose per unstable renal function (pharmacist dosing)   Does not apply See admin instructions   Continuous Infusions:  sodium chloride     meropenem (MERREM) IV 200 mL/hr at 10/29/22 2341   [START  ON 11/01/2022] vancomycin     PRN Meds:.sodium chloride, acetaminophen **OR** acetaminophen, benzonatate, heparin sodium (porcine), hydrALAZINE, hydrOXYzine, levalbuterol, metoprolol tartrate, ondansetron **OR** ondansetron (ZOFRAN) IV, oxyCODONE-acetaminophen, sodium chloride flush, traZODone   Louie Bun,  MD 10/30/2022, 10:50 AM

## 2022-10-31 ENCOUNTER — Inpatient Hospital Stay (HOSPITAL_COMMUNITY): Payer: Medicare Other | Admitting: Anesthesiology

## 2022-10-31 ENCOUNTER — Encounter (HOSPITAL_COMMUNITY)
Admission: EM | Disposition: A | Discharge status: Home / Self Care | Payer: Self-pay | Source: Home / Self Care | Attending: Student

## 2022-10-31 ENCOUNTER — Encounter (HOSPITAL_COMMUNITY): Payer: Self-pay | Admitting: Internal Medicine

## 2022-10-31 DIAGNOSIS — K2970 Gastritis, unspecified, without bleeding: Secondary | ICD-10-CM

## 2022-10-31 DIAGNOSIS — K31819 Angiodysplasia of stomach and duodenum without bleeding: Secondary | ICD-10-CM | POA: Diagnosis not present

## 2022-10-31 DIAGNOSIS — I5033 Acute on chronic diastolic (congestive) heart failure: Secondary | ICD-10-CM

## 2022-10-31 DIAGNOSIS — D649 Anemia, unspecified: Secondary | ICD-10-CM

## 2022-10-31 DIAGNOSIS — N186 End stage renal disease: Secondary | ICD-10-CM

## 2022-10-31 DIAGNOSIS — J9601 Acute respiratory failure with hypoxia: Secondary | ICD-10-CM | POA: Diagnosis not present

## 2022-10-31 DIAGNOSIS — I132 Hypertensive heart and chronic kidney disease with heart failure and with stage 5 chronic kidney disease, or end stage renal disease: Secondary | ICD-10-CM

## 2022-10-31 DIAGNOSIS — Z992 Dependence on renal dialysis: Secondary | ICD-10-CM

## 2022-10-31 DIAGNOSIS — Z8774 Personal history of (corrected) congenital malformations of heart and circulatory system: Secondary | ICD-10-CM | POA: Diagnosis not present

## 2022-10-31 DIAGNOSIS — K5521 Angiodysplasia of colon with hemorrhage: Secondary | ICD-10-CM

## 2022-10-31 DIAGNOSIS — J189 Pneumonia, unspecified organism: Secondary | ICD-10-CM | POA: Diagnosis not present

## 2022-10-31 DIAGNOSIS — K5520 Angiodysplasia of colon without hemorrhage: Secondary | ICD-10-CM

## 2022-10-31 HISTORY — PX: HOT HEMOSTASIS: SHX5433

## 2022-10-31 HISTORY — PX: ENTEROSCOPY: SHX5533

## 2022-10-31 LAB — CBC
HCT: 24.4 % — ABNORMAL LOW (ref 36.0–46.0)
Hemoglobin: 7.7 g/dL — ABNORMAL LOW (ref 12.0–15.0)
MCH: 30.7 pg (ref 26.0–34.0)
MCHC: 31.6 g/dL (ref 30.0–36.0)
MCV: 97.2 fL (ref 80.0–100.0)
Platelets: 221 10*3/uL (ref 150–400)
RBC: 2.51 MIL/uL — ABNORMAL LOW (ref 3.87–5.11)
RDW: 18.5 % — ABNORMAL HIGH (ref 11.5–15.5)
WBC: 7.6 10*3/uL (ref 4.0–10.5)
nRBC: 0 % (ref 0.0–0.2)

## 2022-10-31 LAB — GLUCOSE, CAPILLARY
Glucose-Capillary: 77 mg/dL (ref 70–99)
Glucose-Capillary: 53 mg/dL — ABNORMAL LOW (ref 70–99)
Glucose-Capillary: 75 mg/dL (ref 70–99)
Glucose-Capillary: 92 mg/dL (ref 70–99)

## 2022-10-31 SURGERY — ENTEROSCOPY
Anesthesia: Monitor Anesthesia Care

## 2022-10-31 MED ORDER — SODIUM CHLORIDE 0.9 % IV SOLN: INTRAVENOUS | Status: DC

## 2022-10-31 MED ORDER — LIDOCAINE 2% (20 MG/ML) 5 ML SYRINGE
INTRAMUSCULAR | Status: DC | PRN
  Administered 2022-10-31: 30 mg via INTRAVENOUS

## 2022-10-31 MED ORDER — GLUCAGON HCL RDNA (DIAGNOSTIC) 1 MG IJ SOLR
INTRAMUSCULAR | Status: AC
  Filled 2022-10-31: qty 1

## 2022-10-31 MED ORDER — MOMETASONE FURO-FORMOTEROL FUM 200-5 MCG/ACT IN AERO
2.0000 | INHALATION_SPRAY | Freq: Two times a day (BID) | RESPIRATORY_TRACT | Status: DC
  Administered 2022-11-02: 2 via RESPIRATORY_TRACT
  Filled 2022-10-31: qty 8.8

## 2022-10-31 MED ORDER — UMECLIDINIUM BROMIDE 62.5 MCG/ACT IN AEPB
1.0000 | INHALATION_SPRAY | Freq: Every day | RESPIRATORY_TRACT | Status: DC
  Administered 2022-11-02: 1 via RESPIRATORY_TRACT
  Filled 2022-10-31: qty 7

## 2022-10-31 MED ORDER — PROPOFOL 10 MG/ML IV BOLUS
INTRAVENOUS | Status: DC | PRN
  Administered 2022-10-31 (×2): 30 mg via INTRAVENOUS
  Administered 2022-10-31: 50 mg via INTRAVENOUS

## 2022-10-31 MED ORDER — PROPOFOL 500 MG/50ML IV EMUL
INTRAVENOUS | Status: DC | PRN
  Administered 2022-10-31: 90 ug/kg/min via INTRAVENOUS

## 2022-10-31 NOTE — Anesthesia Preprocedure Evaluation (Addendum)
Anesthesia Evaluation  Patient identified by MRN, date of birth, ID band Patient awake    Reviewed: Allergy & Precautions, NPO status , Patient's Chart, lab work & pertinent test results  Airway Mallampati: II  TM Distance: >3 FB Neck ROM: Full    Dental no notable dental hx.    Pulmonary asthma , COPD, Current Smoker and Patient abstained from smoking.   Pulmonary exam normal        Cardiovascular hypertension, +CHF   Rhythm:Regular Rate:Normal     Neuro/Psych   Anxiety Depression    negative neurological ROS     GI/Hepatic PUD,GERD  ,,  Endo/Other  diabetes    Renal/GU   negative genitourinary   Musculoskeletal  (+) Arthritis , Osteoarthritis,    Abdominal Normal abdominal exam  (+)   Peds  Hematology  (+) Blood dyscrasia, anemia   Anesthesia Other Findings   Reproductive/Obstetrics                             Anesthesia Physical Anesthesia Plan  ASA: 3  Anesthesia Plan: MAC   Post-op Pain Management:    Induction: Intravenous  PONV Risk Score and Plan: 1 and Propofol infusion and Treatment may vary due to age or medical condition  Airway Management Planned: Simple Face Mask and Nasal Cannula  Additional Equipment: None  Intra-op Plan:   Post-operative Plan:   Informed Consent: I have reviewed the patients History and Physical, chart, labs and discussed the procedure including the risks, benefits and alternatives for the proposed anesthesia with the patient or authorized representative who has indicated his/her understanding and acceptance.     Dental advisory given  Plan Discussed with: CRNA  Anesthesia Plan Comments:        Anesthesia Quick Evaluation

## 2022-10-31 NOTE — TOC Progression Note (Signed)
Transition of Care [facility]) - Progression Note    Patient Details  Name: [name] MRN: [id number] Date of Birth: 06-08-1960  Transition of Care [facility]) CM/SW Contact  Harriet Masson, RN Phone Number: 10/31/2022, 9:11 AM  Clinical Narrative:    Spoke to patient regarding transition needs. Patient Requeting this RNCM call [facility] to find out if they have any beds.  This RNCM left message requesting return call.  Spoke to Trinity, daughter, who states she will try to bring patient's phone this afternoon.   TOC following.   Expected Discharge Plan: Homeless Shelter Barriers to Discharge: Continued Medical Work up  Expected Discharge Plan and Services                                               Social Determinants of Health (SDOH) Interventions SDOH Screenings   Food Insecurity: No Food Insecurity (08/13/2022)  Housing: Patient Unable To Answer (08/13/2022)  Transportation Needs: No Transportation Needs (08/13/2022)  Recent Concern: Transportation Needs - Unmet Transportation Needs (08/02/2022)  Utilities: Not At Risk (08/13/2022)  Depression (PHQ2-9): Medium Risk (01/21/2022)  Tobacco Use: High Risk (10/25/2022)    Readmission Risk Interventions    08/13/2022    4:47 PM 08/02/2022    1:56 PM 11/16/2021   12:16 PM  Readmission Risk Prevention Plan  Transportation Screening Complete Complete Complete  PCP or Specialist Appt within 3-5 Days   Complete  HRI or Home Care Consult   Complete  Social Work Consult for Recovery Care Planning/Counseling   Complete  Palliative Care Screening   Not Applicable  Medication Review Oceanographer) Complete Referral to Pharmacy Complete  PCP or Specialist appointment within 3-5 days of discharge Complete Complete   HRI or Home Care Consult Complete Complete   SW Recovery Care/Counseling Consult  Complete   Palliative Care Screening Not Applicable Not Applicable   Skilled Nursing Facility Not Applicable Not Applicable

## 2022-10-31 NOTE — Progress Notes (Signed)
[facility] Progress Note   Subjective:   Patient states dialysis went well yesterday.  Denies any complaints this morning.  Plan for endoscopy today.  Objective Vitals:   10/30/22 2327 10/31/22 0602 10/31/22 0805 10/31/22 0923  BP: 138/64 (!) 143/65 (!) 107/58 138/70  Pulse: 83 72 69 73  Resp: 20 16 (!) 24 14  Temp: 98.4 F (36.9 C) (!) 95 F (35 C) 98.4 F (36.9 C) 98.8 F (37.1 C)  TempSrc: Oral Axillary Oral Temporal  SpO2: 97% 100% 97% 97%  Weight:  50.2 kg    Height:       Physical Exam General: Alert female, chronically ill appearing, NAD Heart: Normal rate, no rub Lungs: Bilateral chest rise with no increased work of breathing Abdomen: Soft, non-distended, +BS Extremities: No edema b/l lower extremities Dialysis Access:  TDC, RUE AVF +bruit  Additional Objective Labs: Basic Metabolic Panel: Recent Labs  Lab 10/25/22 2053 10/26/22 0806 10/28/22 0032 10/29/22 0110 10/30/22 0312  NA 130*   < > 129* 134* 134*  K 3.4*   < > 3.6 3.3* 4.3  CL 102   < > 94* 96* 97*  CO2 11*   < > 19* 26 24  GLUCOSE 99   < > 122* 125* 72  BUN 74*   < > 70* 35* 56*  CREATININE 4.69*   < > 4.21* 2.44* 3.25*  CALCIUM 7.8*   < > 7.8* 7.3* 7.4*  PHOS 5.8*  --   --  2.7 3.1   < > = values in this interval not displayed.   Liver Function Tests: Recent Labs  Lab 10/25/22 1351 10/25/22 2053 10/27/22 0032 10/28/22 0032 10/29/22 0110 10/30/22 0312  AST 23  --  26 33  --   --   ALT 15  --  13 15  --   --   ALKPHOS 76  --  55 48  --   --   BILITOT 0.6  --  0.4 0.7  --   --   PROT 6.3*  --  5.2* 5.5*  --   --   ALBUMIN 2.5*   < > 1.9* 1.9* 1.8* 1.8*   < > = values in this interval not displayed.   Recent Labs  Lab 10/25/22 1351  LIPASE 41   CBC: Recent Labs  Lab 10/25/22 1351 10/25/22 1354 10/25/22 2053 10/26/22 0622 10/28/22 0032 10/28/22 0741 10/29/22 0110 10/30/22 0804 10/30/22 0904 10/31/22 0831  WBC 12.5*  --  9.1   < > 7.2 7.9 8.3 7.9  --   7.6  NEUTROABS 11.4*  --  8.2*  --   --   --   --   --   --   --   HGB 6.5*   < > 8.1*   < > 7.8* 8.8* 7.8* 8.0* 8.1* 7.7*  HCT 19.3*   < > 24.3*   < > 23.1* 26.0* 23.4* 25.0* 25.1* 24.4*  MCV 100.0  --  94.9   < > 90.6 90.6 92.5 96.2  --  97.2  PLT 278  --  248   < > 168 192 191 218  --  221   < > = values in this interval not displayed.   Blood Culture    Component Value Date/Time   SDES URINE, CLEAN CATCH 10/25/2022 2015   SPECREQUEST  10/25/2022 2015    NONE Reflexed from [id number] Performed at [facility], [address], [place], [place] [address]  CULT (A) 10/25/2022 2015    >=100,000 COLONIES/mL KLEBSIELLA PNEUMONIAE Confirmed Extended Spectrum Beta-Lactamase Producer (ESBL).  In bloodstream infections from ESBL organisms, carbapenems are preferred over piperacillin/tazobactam. They are shown to have a lower risk of mortality.    REPTSTATUS 10/27/2022 FINAL 10/25/2022 2015    Cardiac Enzymes: No results for input(s): "CKTOTAL", "CKMB", "CKMBINDEX", "TROPONINI" in the last 168 hours. CBG: Recent Labs  Lab 10/29/22 2116 10/30/22 0612 10/30/22 1604 10/30/22 2128 10/31/22 0558  GLUCAP 133* 81 93 140* 77   Iron Studies:  No results for input(s): "IRON", "TIBC", "TRANSFERRIN", "FERRITIN" in the last 72 hours.  @lablastinr3 @ Studies/Results: DG Chest Port 1 View  Result Date: 10/30/2022 CLINICAL DATA:  Chest pain EXAM: PORTABLE CHEST 1 VIEW COMPARISON:  10/25/2022 FINDINGS: The patient is rotated to the right on today's radiograph, reducing diagnostic sensitivity and specificity. Dual lumen right IJ catheter tip: SVC. Indistinct right hemidiaphragm compatible with consolidation. Confluent bandlike opacities at the left lung base. Mild indistinctness of the pulmonary vasculature. Reverse lordotic projection. Heart size believed to be within normal limits. IMPRESSION: 1. Right lower lobe airspace opacity suspicious for pneumonia. 2. Confluent bandlike opacities at the  left lung base, potentially from atelectasis or pneumonia. 3. Mild indistinctness of the pulmonary vasculature, potentially from pulmonary venous congestion. Electronically Signed   By: Gaylyn Rong M.D.   On: 10/30/2022 18:44   Medications:  [MAR Hold] sodium chloride     sodium chloride 10 mL/hr at 10/31/22 0931   [MAR Hold] meropenem (MERREM) IV 500 mg (10/30/22 1754)   [MAR Hold] vancomycin      [MAR Hold] sodium chloride   Intravenous Once   [MAR Hold] sodium chloride   Intravenous Once   [MAR Hold] arformoterol  15 mcg Nebulization BID   [MAR Hold] atorvastatin  10 mg Oral Daily   [MAR Hold] budesonide (PULMICORT) nebulizer solution  0.5 mg Nebulization BID   [MAR Hold] busPIRone  10 mg Oral BID   [MAR Hold] calcitRIOL  0.5 mcg Oral Q M,W,F-HD   [MAR Hold] Chlorhexidine Gluconate Cloth  6 each Topical Q0600   [MAR Hold] darbepoetin (ARANESP) injection - DIALYSIS  100 mcg Subcutaneous Q Mon-1800   [MAR Hold] docusate sodium  100 mg Oral BID   [MAR Hold] feeding supplement  237 mL Oral BID BM   [MAR Hold] gabapentin  100 mg Oral TID   [MAR Hold] guaiFENesin  600 mg Oral BID   [MAR Hold] multivitamin  1 tablet Oral QHS   mupirocin ointment  1 Application Nasal BID   [MAR Hold] nicotine  21 mg Transdermal Daily   [MAR Hold] pantoprazole  40 mg Oral BID   [MAR Hold] revefenacin  175 mcg Nebulization Daily   [MAR Hold] sodium bicarbonate  1,300 mg Oral TID   [MAR Hold] sodium chloride flush  3 mL Intravenous Q12H    OP Dialysis Orders: Previously at [facility], unable to obtain records given she was discharged  Assessment/Plan: Uremia/ESRD- She reports she missed 1 month of dialysis but was feeling well until this week.  Requirements for dialysis on arrival were acidosis and volume overload.  Maintain MWF schedule for now.  She has been accepted to [place] [facility] but she does not know where she will live and how she will get to dialysis.  Appreciate help from social work Acute  hypoxic respiratory failure: Possibly pneumonia but also volume overloaded.  Has improved.  Ultrafiltration with dialysis. Metabolic acidosis: Related to renal failure.  Improved with dialysis  and supplementation HTN/ volume -improved with dialysis and volume removal Anemia esrd - Hb ~8 today. s/p PRBC transfusion. GI onboard. ESA ordered here. Iron very low but hard to interpret given she had transfusion.  EGD with GI today. Secondary hyperparathyroidism: Started calcitriol.  Continue to monitor Dialysis access- Has [facility], looks like she missed calls from vascular surgery to schedule second stage surgery, will need to follow up outpatient vs consult [facility] if stay is prolonged.

## 2022-10-31 NOTE — TOC Progression Note (Signed)
Transition of Care [facility]) - Progression Note    Patient Details  Name: [name] MRN: [id number] Date of Birth: 10-09-1960  Transition [facility] Care [facility]) CM/SW Contact  Harriet Masson, RN Phone Number: 10/31/2022, 3:02 PM  Clinical Narrative:    Notified patient that [facility] has not return call and that either motel or [facility] are her only options.  Patient hung up on this RNCM. TOC following.    Expected Discharge Plan: Homeless Shelter Barriers to Discharge: Continued Medical Work up  Expected Discharge Plan and Services                                               Social Determinants of Health (SDOH) Interventions SDOH Screenings   Food Insecurity: No Food Insecurity (08/13/2022)  Housing: Patient Unable To Answer (08/13/2022)  Transportation Needs: No Transportation Needs (08/13/2022)  Recent Concern: Transportation Needs - Unmet Transportation Needs (08/02/2022)  Utilities: Not At Risk (08/13/2022)  Depression (PHQ2-9): Medium Risk (01/21/2022)  Tobacco Use: High Risk (10/31/2022)    Readmission Risk Interventions    08/13/2022    4:47 PM 08/02/2022    1:56 PM 11/16/2021   12:16 PM  Readmission Risk Prevention Plan  Transportation Screening Complete Complete Complete  PCP or Specialist Appt within 3-5 Days   Complete  HRI or Home Care Consult   Complete  Social Work Consult for Recovery Care Planning/Counseling   Complete  Palliative Care Screening   Not Applicable  Medication Review Oceanographer) Complete Referral to Pharmacy Complete  PCP or Specialist appointment within 3-5 days of discharge Complete Complete   HRI or Home Care Consult Complete Complete   SW Recovery Care/Counseling Consult  Complete   Palliative Care Screening Not Applicable Not Applicable   Skilled Nursing Facility Not Applicable Not Applicable

## 2022-10-31 NOTE — Progress Notes (Signed)
PROGRESS NOTE  [name] ZOX:[id number] DOB: 02-10-1961   PCP: [name], MD  Patient is from: Homeless  DOA: 10/25/2022 LOS: 6  Chief complaints Chief Complaint  Patient presents with   Shortness of Breath     Brief Narrative / Interim history: 62 year old F with PMH of ESRD noncompliant with HD (missed HD for over a month), COPD, PAD, GERD, GIB, AVM, anemia, HLD, hep C, tobacco use disorder and homelessness presenting with shortness of breath and chest pain, and admitted for COPD exacerbation and sepsis due to bilateral pneumonia.  Started on steroid, antibiotics and nebulizers.  Urine culture with ESBL Klebsiella.  She was initially on IV cefepime and switched to IV meropenem after urine culture sensitivity.  Hemoglobin was 5.5 on admission.  Hemoccult was positive.  She was transfused 2 units with improvement.  H&H stable.  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  GI recommended advancing diet as tolerated, daily PPI, monitoring Hgb and iron studies over time.  Patient completes IV meropenem on 8/17.  She has outpatient chair for HD TTS.  Complicated disposition.  Likely motel or shelter.  TOC on board.   Subjective: Seen and examined earlier this morning after she returned from enteroscopy.  Objective: Vitals:   10/31/22 1045 10/31/22 1050 10/31/22 1055 10/31/22 1105  BP: (!) 92/55 97/60 (!) 97/55 (!) 109/56  Pulse: 73 69 69 70  Resp: (!) 21 (!) 22 16 19   Temp: (!) 97 F (36.1 C)     TempSrc: Temporal     SpO2: 99% 98% 99% 97%  Weight:      Height:        Examination:  GENERAL: Appears frail.  No apparent distress. HEENT: MMM.  Vision and hearing grossly intact.  NECK: Supple.  No apparent JVD.  RESP:  No IWOB.  Fair aeration bilaterally.  Rhonchi bilaterally. CVS:  RRR. Heart sounds normal.  ABD/GI/GU: BS+. Abd  soft, NTND.  MSK/EXT:  Moves extremities.  Significant muscle mass and subcu fat loss.  SKIN: no apparent skin lesion or wound NEURO: Awake, alert and oriented appropriately.  No apparent focal neuro deficit. PSYCH: Calm.  No distress or agitation.  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  GI recommended advancing diet as tolerated, daily PPI, monitoring Hgb and iron studies over time.  Microbiology summarized: 8/9-full RVP nonreactive 8/10-MRSA PCR screen positive 8/9-blood culture with staph epidermis with methicillin resistance in 1 out of 4 bottles. 8/9-urine culture with ESBL Klebsiella pneumonia.  Assessment and plan: Principal Problem:   Right lower lobe pneumonia Active Problems:   Metabolic acidosis   Chronic hyponatremia   Acute on chronic blood loss anemia   Acute hypoxic respiratory failure (HCC)   ESRD on dialysis-noncompliant with outpatient dialysis due to homeless [facility])   History of arteriovenous malformation (AVM)   COPD with acute exacerbation (HCC)   SIRS (systemic inflammatory response syndrome) (HCC)   Essential hypertension   Hepatitis C   Chronic disease anemia   GERD (gastroesophageal reflux disease)   Chronic diastolic CHF (congestive heart failure) (HCC)   Premature atrial complex due to COPD exacerbation and acute hypoxic respiratory failure   Chronic pain syndrome   Angiodysplasia of upper gastrointestinal tract   Protein-calorie malnutrition,  severe   Angiodysplasia of intestine with hemorrhage  Sepsis secondary to community-acquired pneumonia, bilateral pneumonia: Culture data as above. -Continue vancomycin with HD for total of 7 days until 8/16 -Changed cefepime to meropenem on 8/12 given ESBL Klebsiella pneumonia in urine-completed 5 days course on 8/17. -Continue scheduled and as needed nebulizers  nebulizers and pulmonary toilet.  COPD exacerbation: Still with significant cough.  Smokes about half to 1 pack a day.  Doubt compliance with inhalers. -Completed 5 days of Solu-Medrol. -Antibiotics as above -Change nebulizers to Dulera and Incruse Ellipta  -Continue Xopenex as needed -Continue mucolytic's and antitussive -Encouraged smoking cessation. -Continue Atarax for anxiety.  Atypical chest pain/elevated troponin: Hemodynamically stable.  On room air.  She has symmetric pulses. EKG shows A-fib without RVR.  No acute ischemic changes.  Troponin elevated to 90s but flat likely from delayed clearance in the setting of ESRD.  Portable CXR with bibasilar opacity, mild indistinctness of pulmonary vasculature.  Patient is already on broad-spectrum antibiotics and dialysis.  Chest pain improved. -Increased Atarax to 25 mg 3 times daily as needed -Incentive spirometry/OOB/PT/OT -Continue PPI   Acute hypoxic respiratory failure: Resolved.  Currently on room air. -Manage pneumonia and COPD as above -Ambulatory saturation/incentive monitor/OOB/PT/OT   ESBL UTI: Urine culture with ESBL Klebsiella pneumonia. -Change IV cefepime to meropenem.  Treat for 3 days.  ESRD/metabolic acidosis/hyponatremia: Reportedly has not had dialysis in over a month.  She is homeless and has no phone.  Makes urine.  Seems like she is on Lasix at home. -Nephrology on board   Positive blood culture: Blood culture with staph epidermis with methicillin-resistant in 1 out of 4 bottles likely contaminant.   Acute on chronic blood loss anemia/GI bleed: history of recurrent GI bleed, history of AVM: H&H stable after 2 units. 8/15-enteroscopy normal esophagus, mild chronic gastritis, single AVM in the stomach, 3 nonbleeding AVM in duodenum and 2 nonbleeding AVM in jejunum all treated with APC.  Recent Labs    10/27/22 0032 10/27/22 0911 10/27/22 1340 10/27/22 1738 10/28/22 0032 10/28/22 0741 10/29/22 0110  10/30/22 0804 10/30/22 0904 10/31/22 0831  HGB 8.2* 8.6* 8.3* 9.4* 7.8* 8.8* 7.8* 8.0* 8.1* 7.7*  -Appreciate GI recs-8/15-advance diet as tolerated, daily PPI, monitoring Hgb and iron studies over time. -Aranesp per nephrology -Continue Protonix  Anxiety: Reports feeling anxious.  Feels overwhelmed which is understandable given his comorbidity and homelessness.  No imminent danger.  She denies suicidal ideation.  -Continue gabapentin and BuSpar -Increased Atarax to 25 mg 3 times daily with meals -Needs outpatient follow-up with psychiatry.  Tobacco use disorder: Reports cutting down to half a pack a day recently due to shortness of breath. -Encourage cessation -Nicotine patch  Hypokalemia:  -Monitor and replenish as appropriate.  Hypoglycemia: Likely due to n.p.o. for enteroscopy.  CBG 53.  Not symptomatic.  Eating lunch. -Recheck after intervention  Homelessness:  -TOC on board-likely discharge to motel for shelter.  Severe malnutrition: POA Body mass index is 17.33 kg/m. Nutrition Problem: Severe Malnutrition Etiology: chronic illness (ESRD on HD, COPD) Signs/Symptoms: severe fat depletion, severe muscle depletion, percent weight loss Percent weight loss: 23 % (over 1 year) Interventions: Refer to RD note for recommendations   DVT prophylaxis:  SCDs Start: 10/25/22 2022  Code Status: Full code Family Communication: None at bedside Level of care: Telemetry Medical Status is: Inpatient Remains inpatient appropriate because: Pneumonia, COPD exacerbation, GI bleed and ESRD   Final disposition: Motel or shelter. Consultants:  Nephrology Gastroenterology  55 minutes  with more than 50% spent in reviewing records, counseling patient/family and coordinating care.   Sch Meds:  Scheduled Meds:  sodium chloride   Intravenous Once   sodium chloride   Intravenous Once   arformoterol  15 mcg Nebulization BID   atorvastatin  10 mg Oral Daily   budesonide (PULMICORT)  nebulizer solution  0.5 mg Nebulization BID   busPIRone  10 mg Oral BID   calcitRIOL  0.5 mcg Oral Q M,W,F-HD   Chlorhexidine Gluconate Cloth  6 each Topical Q0600   darbepoetin (ARANESP) injection - DIALYSIS  100 mcg Subcutaneous Q Mon-1800   docusate sodium  100 mg Oral BID   feeding supplement  237 mL Oral BID BM   gabapentin  100 mg Oral TID   guaiFENesin  600 mg Oral BID   multivitamin  1 tablet Oral QHS   nicotine  21 mg Transdermal Daily   pantoprazole  40 mg Oral BID   revefenacin  175 mcg Nebulization Daily   sodium bicarbonate  1,300 mg Oral TID   sodium chloride flush  3 mL Intravenous Q12H   Continuous Infusions:  sodium chloride     meropenem (MERREM) IV 500 mg (10/30/22 1754)   [START ON 11/01/2022] vancomycin     PRN Meds:.sodium chloride, acetaminophen **OR** acetaminophen, benzonatate, hydrALAZINE, hydrOXYzine, levalbuterol, metoprolol tartrate, ondansetron **OR** ondansetron (ZOFRAN) IV, oxyCODONE-acetaminophen, sodium chloride flush, traZODone  Antimicrobials: Anti-infectives (From admission, onward)    Start     Dose/Rate Route Frequency Ordered Stop   11/01/22 1200  vancomycin (VANCOREADY) IVPB 500 mg/100 mL        500 mg 100 mL/hr over 60 Minutes Intravenous Every M-W-F (Hemodialysis) 10/29/22 1308 11/01/22 2359   10/29/22 1115  vancomycin (VANCOREADY) IVPB 500 mg/100 mL        500 mg 100 mL/hr over 60 Minutes Intravenous  Once 10/29/22 1017 10/29/22 1311   10/28/22 1800  meropenem (MERREM) 500 mg in sodium chloride 0.9 % 100 mL IVPB        500 mg 200 mL/hr over 30 Minutes Intravenous Every 24 hours 10/28/22 1156 11/02/22 1759   10/28/22 1530  vancomycin (VANCOCIN) 500 mg in sodium chloride 0.9 % 100 mL IVPB  Status:  Discontinued        500 mg 105 mL/hr over 60 Minutes Intravenous  Once 10/28/22 1451 10/28/22 1500   10/28/22 1530  vancomycin (VANCOCIN) 500 mg in sodium chloride 0.9 % 100 mL IVPB        500 mg 100 mL/hr over 60 Minutes Intravenous  Once  10/28/22 1501 10/28/22 1748   10/28/22 1430  vancomycin (VANCOREADY) IVPB 500 mg/100 mL  Status:  Discontinued        500 mg 100 mL/hr over 60 Minutes Intravenous  Once 10/28/22 1332 10/28/22 1452   10/28/22 1200  vancomycin (VANCOREADY) IVPB 500 mg/100 mL  Status:  Discontinued        500 mg 100 mL/hr over 60 Minutes Intravenous Once in dialysis 10/28/22 0832 10/28/22 1332   10/26/22 1800  ceFEPIme (MAXIPIME) 1 g in sodium chloride 0.9 % 100 mL IVPB  Status:  Discontinued        1 g 200 mL/hr over 30 Minutes Intravenous Every 24 hours 10/25/22 2111 10/28/22 0733   10/26/22 1530  vancomycin (VANCOREADY) IVPB 500 mg/100 mL        500 mg 100 mL/hr over 60 Minutes Intravenous  Once 10/26/22 1442 10/26/22 1709   10/25/22 2115  vancomycin variable dose per  unstable renal function (pharmacist dosing)  Status:  Discontinued         Does not apply See admin instructions 10/25/22 2115 10/30/22 1347   10/25/22 1815  ceFEPIme (MAXIPIME) 1 g in sodium chloride 0.9 % 100 mL IVPB        1 g 200 mL/hr over 30 Minutes Intravenous  Once 10/25/22 1806 10/25/22 1925   10/25/22 1800  vancomycin (VANCOCIN) IVPB 1000 mg/200 mL premix        1,000 mg 200 mL/hr over 60 Minutes Intravenous  Once 10/25/22 1757 10/25/22 2003        I have personally reviewed the following labs and images: CBC: Recent Labs  Lab 10/25/22 1351 10/25/22 1354 10/25/22 2053 10/26/22 0622 10/28/22 0032 10/28/22 0741 10/29/22 0110 10/30/22 0804 10/30/22 0904 10/31/22 0831  WBC 12.5*  --  9.1   < > 7.2 7.9 8.3 7.9  --  7.6  NEUTROABS 11.4*  --  8.2*  --   --   --   --   --   --   --   HGB 6.5*   < > 8.1*   < > 7.8* 8.8* 7.8* 8.0* 8.1* 7.7*  HCT 19.3*   < > 24.3*   < > 23.1* 26.0* 23.4* 25.0* 25.1* 24.4*  MCV 100.0  --  94.9   < > 90.6 90.6 92.5 96.2  --  97.2  PLT 278  --  248   < > 168 192 191 218  --  221   < > = values in this interval not displayed.   BMP &GFR Recent Labs  Lab 10/25/22 1351 10/25/22 1354  10/25/22 2053 10/26/22 0806 10/26/22 1810 10/27/22 0032 10/28/22 0032 10/29/22 0110 10/30/22 0312  NA 128*   < > 130* 130*  --  129* 129* 134* 134*  K 3.5   < > 3.4* 2.8*  --  3.4* 3.6 3.3* 4.3  CL 102  --  102 94*  --  91* 94* 96* 97*  CO2 9*  --  11* 22  --  19* 19* 26 24  GLUCOSE 107*  --  99 223*  --  142* 122* 125* 72  BUN 75*  --  74* 25*  --  42* 70* 35* 56*  CREATININE 4.80*  --  4.69* 2.24*  --  2.93* 4.21* 2.44* 3.25*  CALCIUM 8.0*  --  7.8* 7.6*  --  8.0* 7.8* 7.3* 7.4*  MG 1.6*  --   --   --  1.8 2.0  --  1.6* 1.9  PHOS  --   --  5.8*  --   --   --   --  2.7 3.1   < > = values in this interval not displayed.   Estimated Creatinine Clearance: 14.2 mL/min (A) (by C-G formula based on SCr of 3.25 mg/dL (H)). Liver & Pancreas: Recent Labs  Lab 10/25/22 1351 10/25/22 2053 10/27/22 0032 10/28/22 0032 10/29/22 0110 10/30/22 0312  AST 23  --  26 33  --   --   ALT 15  --  13 15  --   --   ALKPHOS 76  --  55 48  --   --   BILITOT 0.6  --  0.4 0.7  --   --   PROT 6.3*  --  5.2* 5.5*  --   --   ALBUMIN 2.5* 2.4* 1.9* 1.9* 1.8* 1.8*   Recent Labs  Lab 10/25/22 1351  LIPASE 41  No results for input(s): "AMMONIA" in the last 168 hours. Diabetic: No results for input(s): "HGBA1C" in the last 72 hours.  Recent Labs  Lab 10/29/22 2116 10/30/22 0612 10/30/22 1604 10/30/22 2128 10/31/22 0558  GLUCAP 133* 81 93 140* 77   Cardiac Enzymes: No results for input(s): "CKTOTAL", "CKMB", "CKMBINDEX", "TROPONINI" in the last 168 hours. No results for input(s): "PROBNP" in the last 8760 hours. Coagulation Profile: No results for input(s): "INR", "PROTIME" in the last 168 hours. Thyroid Function Tests: No results for input(s): "TSH", "T4TOTAL", "FREET4", "T3FREE", "THYROIDAB" in the last 72 hours. Lipid Profile: No results for input(s): "CHOL", "HDL", "LDLCALC", "TRIG", "CHOLHDL", "LDLDIRECT" in the last 72 hours.  Anemia Panel: No results for input(s): "VITAMINB12",  "FOLATE", "FERRITIN", "TIBC", "IRON", "RETICCTPCT" in the last 72 hours.  Urine analysis:    Component Value Date/Time   COLORURINE YELLOW 10/25/2022 2015   APPEARANCEUR HAZY (A) 10/25/2022 2015   LABSPEC 1.009 10/25/2022 2015   PHURINE 5.0 10/25/2022 2015   GLUCOSEU 50 (A) 10/25/2022 2015   HGBUR LARGE (A) 10/25/2022 2015   BILIRUBINUR NEGATIVE 10/25/2022 2015   KETONESUR NEGATIVE 10/25/2022 2015   PROTEINUR 100 (A) 10/25/2022 2015   UROBILINOGEN 0.2 03/02/2010 2027   NITRITE POSITIVE (A) 10/25/2022 2015   LEUKOCYTESUR MODERATE (A) 10/25/2022 2015   Sepsis Labs: Invalid input(s): "PROCALCITONIN", "LACTICIDVEN"  Microbiology: Recent Results (from the past 240 hour(s))  Blood culture (routine x 2)     Status: Abnormal   Collection Time: 10/25/22  1:51 PM   Specimen: BLOOD  Result Value Ref Range Status   Specimen Description BLOOD LEFT ANTECUBITAL  Final   Special Requests   Final    BOTTLES DRAWN AEROBIC ONLY Blood Culture results may not be optimal due to an excessive volume of blood received in culture bottles   Culture  Setup Time   Final    GRAM POSITIVE COCCI GRAM POSITIVE RODS AEROBIC BOTTLE ONLY CRITICAL RESULT CALLED TO, READ BACK BY AND VERIFIED WITH: PHARMD MADISON OWEN [id number] 1656 BY J RAZZAK, MT    Culture (A)  Final    STAPHYLOCOCCUS EPIDERMIDIS THE SIGNIFICANCE OF ISOLATING THIS ORGANISM FROM A SINGLE SET OF BLOOD CULTURES WHEN MULTIPLE SETS ARE DRAWN IS UNCERTAIN. PLEASE NOTIFY THE MICROBIOLOGY DEPARTMENT WITHIN ONE WEEK IF SPECIATION AND SENSITIVITIES ARE REQUIRED. CORYNEBACTERIUM, GROUP JK Standardized susceptibility testing for this organism is not available. Performed at [facility], [address] [place], [place] [address]    Report Status 10/29/2022 FINAL  Final  Blood Culture ID Panel (Reflexed)     Status: Abnormal   Collection Time: 10/25/22  1:51 PM  Result Value Ref Range Status   Enterococcus faecalis NOT DETECTED NOT DETECTED Final    Enterococcus Faecium NOT DETECTED NOT DETECTED Final   Listeria monocytogenes NOT DETECTED NOT DETECTED Final   Staphylococcus species DETECTED (A) NOT DETECTED Final    Comment: CRITICAL RESULT CALLED TO, READ BACK BY AND VERIFIED WITH: PHARMD MADISON OWEN [id number] 1656 BY J RAZZAK, MT    Staphylococcus aureus (BCID) NOT DETECTED NOT DETECTED Final   Staphylococcus epidermidis DETECTED (A) NOT DETECTED Final    Comment: Methicillin (oxacillin) resistant coagulase negative staphylococcus. Possible blood culture contaminant (unless isolated from more than one blood culture draw or clinical case suggests pathogenicity). No antibiotic treatment is indicated for blood  culture contaminants. CRITICAL RESULT CALLED TO, READ BACK BY AND VERIFIED WITH: PHARMD MADISON OWEN [phone] 1656 BY J RAZZAK,MT    Staphylococcus lugdunensis NOT DETECTED  NOT DETECTED Final   Streptococcus species NOT DETECTED NOT DETECTED Final   Streptococcus agalactiae NOT DETECTED NOT DETECTED Final   Streptococcus pneumoniae NOT DETECTED NOT DETECTED Final   Streptococcus pyogenes NOT DETECTED NOT DETECTED Final   A.calcoaceticus-baumannii NOT DETECTED NOT DETECTED Final   Bacteroides fragilis NOT DETECTED NOT DETECTED Final   Enterobacterales NOT DETECTED NOT DETECTED Final   Enterobacter cloacae complex NOT DETECTED NOT DETECTED Final   Escherichia coli NOT DETECTED NOT DETECTED Final   Klebsiella aerogenes NOT DETECTED NOT DETECTED Final   Klebsiella oxytoca NOT DETECTED NOT DETECTED Final   Klebsiella pneumoniae NOT DETECTED NOT DETECTED Final   Proteus species NOT DETECTED NOT DETECTED Final   Salmonella species NOT DETECTED NOT DETECTED Final   Serratia marcescens NOT DETECTED NOT DETECTED Final   Haemophilus influenzae NOT DETECTED NOT DETECTED Final   Neisseria meningitidis NOT DETECTED NOT DETECTED Final   Pseudomonas aeruginosa NOT DETECTED NOT DETECTED Final   Stenotrophomonas maltophilia NOT DETECTED  NOT DETECTED Final   Candida albicans NOT DETECTED NOT DETECTED Final   Candida auris NOT DETECTED NOT DETECTED Final   Candida glabrata NOT DETECTED NOT DETECTED Final   Candida krusei NOT DETECTED NOT DETECTED Final   Candida parapsilosis NOT DETECTED NOT DETECTED Final   Candida tropicalis NOT DETECTED NOT DETECTED Final   Cryptococcus neoformans/gattii NOT DETECTED NOT DETECTED Final   Methicillin resistance mecA/C DETECTED (A) NOT DETECTED Final    Comment: CRITICAL RESULT CALLED TO, READ BACK BY AND VERIFIED WITH: PHARMD MADISON OWEN [id number] 1656 BY Berline Chough, MT Performed at [facility], [address], [place], [place] [address]   Blood culture (routine x 2)     Status: None   Collection Time: 10/25/22  3:03 PM   Specimen: BLOOD LEFT FOREARM  Result Value Ref Range Status   Specimen Description BLOOD LEFT FOREARM  Final   Special Requests   Final    BOTTLES DRAWN AEROBIC AND ANAEROBIC Blood Culture results may not be optimal due to an excessive volume of blood received in culture bottles   Culture   Final    NO GROWTH 5 DAYS Performed at [facility], [address] [place], [place] [address]    Report Status 10/30/2022 FINAL  Final  Urine Culture     Status: Abnormal   Collection Time: 10/25/22  8:15 PM   Specimen: Urine, Clean Catch  Result Value Ref Range Status   Specimen Description URINE, CLEAN CATCH  Final   Special Requests   Final    NONE Reflexed from [id number] Performed at [facility], [address] [place], [place] [address]    Culture (A)  Final    >=100,000 COLONIES/mL KLEBSIELLA PNEUMONIAE Confirmed Extended Spectrum Beta-Lactamase Producer (ESBL).  In bloodstream infections from ESBL organisms, carbapenems are preferred over piperacillin/tazobactam. They are shown to have a lower risk of mortality.    Report Status 10/27/2022 FINAL  Final   Organism ID, Bacteria KLEBSIELLA PNEUMONIAE (A)  Final      Susceptibility   Klebsiella  pneumoniae - MIC*    AMPICILLIN >=32 RESISTANT Resistant     CEFAZOLIN >=64 RESISTANT Resistant     CEFEPIME >=32 RESISTANT Resistant     CEFTRIAXONE >=64 RESISTANT Resistant     CIPROFLOXACIN 0.5 INTERMEDIATE Intermediate     GENTAMICIN >=16 RESISTANT Resistant     IMIPENEM <=0.25 SENSITIVE Sensitive     NITROFURANTOIN 128 RESISTANT Resistant     TRIMETH/SULFA >=  320 RESISTANT Resistant     AMPICILLIN/SULBACTAM >=32 RESISTANT Resistant     PIP/TAZO <=4 SENSITIVE Sensitive     * >=100,000 COLONIES/mL KLEBSIELLA PNEUMONIAE  Respiratory (~20 pathogens) panel by PCR     Status: None   Collection Time: 10/25/22  9:07 PM   Specimen: Nasopharyngeal Swab; Respiratory  Result Value Ref Range Status   Adenovirus NOT DETECTED NOT DETECTED Final   Coronavirus 229E NOT DETECTED NOT DETECTED Final    Comment: (NOTE) The Coronavirus on the Respiratory Panel, DOES NOT test for the novel  Coronavirus (2019 nCoV)    Coronavirus HKU1 NOT DETECTED NOT DETECTED Final   Coronavirus NL63 NOT DETECTED NOT DETECTED Final   Coronavirus OC43 NOT DETECTED NOT DETECTED Final   Metapneumovirus NOT DETECTED NOT DETECTED Final   Rhinovirus / Enterovirus NOT DETECTED NOT DETECTED Final   Influenza A NOT DETECTED NOT DETECTED Final   Influenza B NOT DETECTED NOT DETECTED Final   Parainfluenza Virus 1 NOT DETECTED NOT DETECTED Final   Parainfluenza Virus 2 NOT DETECTED NOT DETECTED Final   Parainfluenza Virus 3 NOT DETECTED NOT DETECTED Final   Parainfluenza Virus 4 NOT DETECTED NOT DETECTED Final   Respiratory Syncytial Virus NOT DETECTED NOT DETECTED Final   Bordetella pertussis NOT DETECTED NOT DETECTED Final   Bordetella Parapertussis NOT DETECTED NOT DETECTED Final   Chlamydophila pneumoniae NOT DETECTED NOT DETECTED Final   Mycoplasma pneumoniae NOT DETECTED NOT DETECTED Final    Comment: Performed at [facility], [address], [place], [place] [address]  MRSA Next Gen by PCR, Nasal     Status:  Abnormal   Collection Time: 10/26/22  2:58 AM   Specimen: Nasal Mucosa; Nasal Swab  Result Value Ref Range Status   MRSA by PCR Next Gen DETECTED (A) NOT DETECTED Final    Comment: RESULT CALLED TO, READ BACK BY AND VERIFIED WITH: [facility] RN 10/26/22 @ 0404 BY AB (NOTE) The GeneXpert MRSA Assay (FDA approved for NASAL specimens only), is one component of a comprehensive MRSA colonization surveillance program. It is not intended to diagnose MRSA infection nor to guide or monitor treatment for MRSA infections. Test performance is not FDA approved in patients less than 73 years old. Performed at [facility], [address], [place], [place] [address]     Radiology Studies: DG Chest Port 1 View  Result Date: 10/30/2022 CLINICAL DATA:  Chest pain EXAM: PORTABLE CHEST 1 VIEW COMPARISON:  10/25/2022 FINDINGS: The patient is rotated to the right on today's radiograph, reducing diagnostic sensitivity and specificity. Dual lumen right IJ catheter tip: SVC. Indistinct right hemidiaphragm compatible with consolidation. Confluent bandlike opacities at the left lung base. Mild indistinctness of the pulmonary vasculature. Reverse lordotic projection. Heart size believed to be within normal limits. IMPRESSION: 1. Right lower lobe airspace opacity suspicious for pneumonia. 2. Confluent bandlike opacities at the left lung base, potentially from atelectasis or pneumonia. 3. Mild indistinctness of the pulmonary vasculature, potentially from pulmonary venous congestion. Electronically Signed   By: Gaylyn Rong M.D.   On: 10/30/2022 18:44       T.  [facility]  If 7PM-7AM, please contact night-coverage [email or web address] 10/31/2022, 11:50 AM

## 2022-10-31 NOTE — Interval H&P Note (Signed)
History and Physical Interval Note: For small bowel enteroscopy today to evaluate recurrent anemia with heme positive stool in the setting of gastric and intestinal angioectasias The nature of the procedure, as well as the risks, benefits, and alternatives were carefully and thoroughly reviewed with the patient. Ample time for discussion and questions allowed. The patient understood, was satisfied, and agreed to proceed.      Latest Ref Rng & Units 10/31/2022    8:31 AM 10/30/2022    9:04 AM 10/30/2022    8:04 AM  CBC  WBC 4.0 - 10.5 K/uL 7.6   7.9   Hemoglobin 12.0 - 15.0 g/dL 7.7  8.1  8.0   Hematocrit 36.0 - 46.0 % 24.4  25.1  25.0   Platelets 150 - 400 K/uL 221   218    Lab Results  Component Value Date   INR 1.1 08/12/2022   INR 1.0 07/31/2018   INR 0.98 06/07/2016     10/31/2022 9:55 AM  [name]  has presented today for surgery, with the diagnosis of anemia, heme positive, hx of AVM;s.  The various methods of treatment have been discussed with the patient and family. After consideration of risks, benefits and other options for treatment, the patient has consented to  Procedure(s): ENTEROSCOPY (N/A) as a surgical intervention.  The patient's history has been reviewed, patient examined, no change in status, stable for surgery.  I have reviewed the patient's chart and labs.  Questions were answered to the patient's satisfaction.     Carie Caddy 

## 2022-10-31 NOTE — Transfer of Care (Signed)
Immediate Anesthesia Transfer of Care Note  Patient: [name]  Procedure(s) Performed: ENTEROSCOPY HOT HEMOSTASIS (ARGON PLASMA COAGULATION/BICAP)  Patient Location: PACU and Endoscopy Unit  Anesthesia Type:MAC  Level of Consciousness: drowsy, patient cooperative, and responds to stimulation  Airway & Oxygen Therapy: Patient Spontanous Breathing and Patient connected to nasal cannula oxygen  Post-op Assessment: Report given to RN  Post vital signs: Reviewed and stable  Last Vitals:  Vitals Value Taken Time  BP 92/55 10/31/22 1044  Temp    Pulse 73 10/31/22 1046  Resp 19 10/31/22 1046  SpO2 100 % 10/31/22 1046  Vitals shown include unfiled device data.  Last Pain:  Vitals:   10/31/22 0923  TempSrc: Temporal  PainSc: 8       Patients Stated Pain Goal: 0 (10/27/22 2054)  Complications: No notable events documented.

## 2022-10-31 NOTE — Progress Notes (Signed)
Physical Therapy Treatment Patient Details Name: [name] MRN: [id number] DOB: 1960-04-28 Today's Date: 10/31/2022   History of Present Illness Patient is a 62 y/o female admitted 10/25/22 with SOB and chest pain.  PMH of ESRD but missed HD x 1 month, COPD, PAD, GERD, GIB, AVM, HLD, anemia, hep C, tobacco use disorder, and homelessness.  Found to have ESBL Klebsiella positive urine culture, and hemoglobin 5.5 with hemoccult positive.  Underwent upper endoscopy 10/31/22.    PT Comments  Attempted 3 times prior to pt ambulating this pm.  Had returned from procedure and with low CBG. Then patient reports too painful in side and abdomen to walk prior to meds so returned twice till pt had received pain medication.  She was still wanting to be left alone, but ambulated with S to CGA due to mild weakness/imbalance.  She mobilizes up from bed on her own.  PT will follow up during acute stay.     If plan is discharge home, recommend the following: Assist for transportation;Help with stairs or ramp for entrance   Can travel by private vehicle        Equipment Recommendations  None recommended by PT    Recommendations for Other Services       Precautions / Restrictions Precautions Precautions: Fall     Mobility  Bed Mobility Overal bed mobility: Modified Independent                  Transfers Overall transfer level: Modified independent Equipment used: None               General transfer comment: assist for lines    Ambulation/Gait Ambulation/Gait assistance: Supervision, Contact guard assist Gait Distance (Feet): 250 Feet Assistive device: None Gait Pattern/deviations: Step-through pattern, Decreased stride length       General Gait Details: walking in hallway S to CGA for safety though pt without LOB today   Stairs             Wheelchair Mobility     Tilt Bed    Modified Rankin (Stroke Patients Only)       Balance     Sitting balance-Leahy  Scale: Good     Standing balance support: No upper extremity supported Standing balance-Leahy Scale: Good                              Cognition Arousal: Alert Behavior During Therapy: Agitated Overall Cognitive Status: Within Functional Limits for tasks assessed                                 General Comments: frustrated with care today and having to wait for pain meds        Exercises      General Comments General comments (skin integrity, edema, etc.): VSS, poor pleth on SpO2 at times and pt coughing, though noted when good waveform reading 95% or greater      Pertinent Vitals/Pain Pain Assessment Faces Pain Scale: Hurts even more Pain Location: side and stomach Pain Descriptors / Indicators: Discomfort, Grimacing Pain Intervention(s): Monitored during session, Repositioned, Patient requesting pain meds-RN notified    Home Living                          Prior Function  PT Goals (current goals can now be found in the care plan section) Progress towards PT goals: Progressing toward goals    Frequency    Min 1X/week      PT Plan      Co-evaluation              AM-PAC PT "6 Clicks" Mobility   Outcome Measure  Help needed turning from your back to your side while in a flat bed without using bedrails?: None Help needed moving from lying on your back to sitting on the side of a flat bed without using bedrails?: None Help needed moving to and from a bed to a chair (including a wheelchair)?: None Help needed standing up from a chair using your arms (e.g., wheelchair or bedside chair)?: None Help needed to walk in hospital room?: A Little Help needed climbing 3-5 steps with a railing? : Total 6 Click Score: 20    End of Session Equipment Utilized During Treatment: Gait belt Activity Tolerance: Patient limited by fatigue Patient left: in bed;with call bell/phone within reach   PT Visit Diagnosis: Muscle  weakness (generalized) (M62.81)     Time: 2725-3664 PT Time Calculation (min) (ACUTE ONLY): 15 min  Charges:    $Gait Training: 8-22 mins PT General Charges $$ ACUTE PT VISIT: 1 Visit                     [name], PT Acute Rehabilitation Services Office:[phone] 10/31/2022    [name] 10/31/2022, 4:45 PM

## 2022-10-31 NOTE — Op Note (Addendum)
[facility] Patient Name: [name] Procedure Date : 10/31/2022 MRN: [id number] Attending MD: Beverley Fiedler , MD, [id number] Date of Birth: 03-29-1960 CSN: [id number] Age: 62 Admit Type: Inpatient Procedure:                Small bowel enteroscopy Indications:              Arteriovenous malformation in the stomach,                            Arteriovenous malformation in the small intestine Providers:                Carie Caddy. Rhea Belton, MD, Margaree Mackintosh, RN,                            Cephus Richer, RN, Leanne Lovely, Technician Referring MD:             [facility] Medicines:                Monitored Anesthesia Care Complications:            No immediate complications. Estimated Blood Loss:     Estimated blood loss: none. Procedure:                Pre-Anesthesia Assessment:                           - Prior to the procedure, a History and Physical                            was performed, and patient medications and                            allergies were reviewed. The patient's tolerance of                            previous anesthesia was also reviewed. The risks                            and benefits of the procedure and the sedation                            options and risks were discussed with the patient.                            All questions were answered, and informed consent                            was obtained. Prior Anticoagulants: The patient has                            taken no anticoagulant or antiplatelet agents. ASA                            Grade Assessment: III - A patient with severe  systemic disease. After reviewing the risks and                            benefits, the patient was deemed in satisfactory                            condition to undergo the procedure.                           After obtaining informed consent, the endoscope was                            passed under  direct vision. Throughout the                            procedure, the patient's blood pressure, pulse, and                            oxygen saturations were monitored continuously. The                            PCF-H190TL ([id number] Olympus slim colonoscope was                            introduced through the mouth and advanced to the                            proximal jejunum. The small bowel enteroscopy was                            accomplished without difficulty. The patient                            tolerated the procedure well. Scope In: Scope Out: Findings:      The examined esophagus was normal.      A single 2 mm angioectasia with no bleeding was found in the gastric       fundus. Fulguration to ablate the lesion to prevent bleeding by argon       plasma at 1 liter/minute and 20 watts was successful.      Mild inflammation characterized by erythema and friability was found in       the gastric antrum and in the prepyloric region of the stomach.       Previously biopsied and thus not repeated today.      Three angioectasias with no bleeding were found in the third portion of       the duodenum and in the fourth portion of the duodenum. Fulguration to       ablate the lesion to prevent bleeding by argon plasma at 0.5       liters/minute and 20 watts was successful.      Two angioectasias with no bleeding were found in the proximal jejunum.       Fulguration to ablate the lesion to prevent bleeding by argon plasma at       0.5 liters/minute and 20 watts was successful. Impression:               -  Normal esophagus.                           - A single non-bleeding angioectasia in the                            stomach. Treated with argon plasma coagulation                            (APC).                           - Mild chronic gastritis.                           - Three non-bleeding angioectasias in the duodenum.                            Treated with argon plasma  coagulation (APC).                           - Two non-bleeding angioectasias in the jejunum.                            Treated with argon plasma coagulation (APC).                           - No specimens collected. Moderate Sedation:      N/A Recommendation:           - Return patient to hospital ward for ongoing care.                           - Advance diet as tolerated.                           - Daily PPI.                           - Monitor Hgb and iron studies over time.                           - GI will sign off, call if questions. Procedure Code(s):        --- Professional ---                           [phone], Small intestinal endoscopy, enteroscopy                            beyond second portion of duodenum, not including                            ileum; with control of bleeding (eg, injection,                            bipolar cautery, unipolar cautery, laser, heater  probe, stapler, plasma coagulator) Diagnosis Code(s):        --- Professional ---                           K31.819, Angiodysplasia of stomach and duodenum                            without bleeding                           K29.70, Gastritis, unspecified, without bleeding                           K55.20, Angiodysplasia of colon without hemorrhage CPT copyright 2022 [employer]. All rights reserved. The codes documented in this report are preliminary and upon coder review may  be revised to meet current compliance requirements. Beverley Fiedler, MD 10/31/2022 10:48:58 AM This report has been signed electronically. Number of Addenda: 0

## 2022-10-31 NOTE — Plan of Care (Signed)
  Problem: Education: Goal: Knowledge of General Education information will improve Description: Including pain rating scale, medication(s)/side effects and non-pharmacologic comfort measures Outcome: Not Progressing   

## 2022-11-01 ENCOUNTER — Telehealth: Payer: Self-pay

## 2022-11-01 DIAGNOSIS — J189 Pneumonia, unspecified organism: Secondary | ICD-10-CM | POA: Diagnosis not present

## 2022-11-01 DIAGNOSIS — J9601 Acute respiratory failure with hypoxia: Secondary | ICD-10-CM | POA: Diagnosis not present

## 2022-11-01 DIAGNOSIS — K5521 Angiodysplasia of colon with hemorrhage: Secondary | ICD-10-CM

## 2022-11-01 DIAGNOSIS — E43 Unspecified severe protein-calorie malnutrition: Secondary | ICD-10-CM

## 2022-11-01 DIAGNOSIS — Z8774 Personal history of (corrected) congenital malformations of heart and circulatory system: Secondary | ICD-10-CM | POA: Diagnosis not present

## 2022-11-01 LAB — RENAL FUNCTION PANEL
Albumin: 1.8 g/dL — ABNORMAL LOW (ref 3.5–5.0)
Anion gap: 12 (ref 5–15)
BUN: 55 mg/dL — ABNORMAL HIGH (ref 8–23)
CO2: 24 mmol/L (ref 22–32)
Calcium: 8.1 mg/dL — ABNORMAL LOW (ref 8.9–10.3)
Chloride: 96 mmol/L — ABNORMAL LOW (ref 98–111)
Creatinine, Ser: 3.47 mg/dL — ABNORMAL HIGH (ref 0.44–1.00)
GFR, Estimated: 14 mL/min — ABNORMAL LOW (ref >60)
Glucose, Bld: 111 mg/dL — ABNORMAL HIGH (ref 70–99)
Phosphorus: 4.7 mg/dL — ABNORMAL HIGH (ref 2.5–4.6)
Potassium: 5 mmol/L (ref 3.5–5.1)
Sodium: 132 mmol/L — ABNORMAL LOW (ref 135–145)

## 2022-11-01 LAB — CBC
HCT: 22.4 % — ABNORMAL LOW (ref 36.0–46.0)
Hemoglobin: 7.1 g/dL — ABNORMAL LOW (ref 12.0–15.0)
MCH: 31.4 pg (ref 26.0–34.0)
MCHC: 31.7 g/dL (ref 30.0–36.0)
MCV: 99.1 fL (ref 80.0–100.0)
Platelets: 240 10*3/uL (ref 150–400)
RBC: 2.26 MIL/uL — ABNORMAL LOW (ref 3.87–5.11)
RDW: 18.3 % — ABNORMAL HIGH (ref 11.5–15.5)
WBC: 7.1 10*3/uL (ref 4.0–10.5)
nRBC: 0.3 % — ABNORMAL HIGH (ref 0.0–0.2)

## 2022-11-01 LAB — GLUCOSE, CAPILLARY
Glucose-Capillary: 87 mg/dL (ref 70–99)
Glucose-Capillary: 114 mg/dL — ABNORMAL HIGH (ref 70–99)
Glucose-Capillary: 48 mg/dL — ABNORMAL LOW (ref 70–99)
Glucose-Capillary: 83 mg/dL (ref 70–99)
Glucose-Capillary: 99 mg/dL (ref 70–99)

## 2022-11-01 LAB — MAGNESIUM: Magnesium: 1.8 mg/dL (ref 1.7–2.4)

## 2022-11-01 MED ORDER — SODIUM CHLORIDE 0.9 % IV SOLN
250.0000 mg | Freq: Every day | INTRAVENOUS | Status: DC
  Administered 2022-11-01: 250 mg via INTRAVENOUS
  Filled 2022-11-01 (×4): qty 20

## 2022-11-01 MED ORDER — DARBEPOETIN ALFA 150 MCG/0.3ML IJ SOSY: 150.0000 ug | PREFILLED_SYRINGE | INTRAMUSCULAR | Status: DC

## 2022-11-01 MED ORDER — HEPARIN SODIUM (PORCINE) 1000 UNIT/ML IJ SOLN
INTRAMUSCULAR | Status: AC
  Filled 2022-11-01: qty 4

## 2022-11-01 NOTE — Procedures (Signed)
HD Note:  Some information was entered later than the data was gathered due to patient care needs. The stated time with the data is accurate.  Received patient in bed to unit.   Alert and oriented.   Informed consent signed and in chart.   TX duration:3.5 hours  Patient tolerated treatment well.   Transported back to the room   Alert, without acute distress.   Access used: Upper right chest catheter Access issues: None  Total UF removed: 1900 ml  2000 pulled and 100 vanc given  Hand-off given to patient's nurse.   Jesly Hartmann L. Dareen Piano, RN Kidney Dialysis Unit.

## 2022-11-01 NOTE — Progress Notes (Signed)
Contacted [facility] to advise clinic that pt may d/c this weekend and start at clinic on Tuesday. Pt has a TTS 11:45 am chair time at clinic and will need to arrive at 11:00 on first day to complete paperwork prior to treatment. Arrangements added to pt's AVS and contacted renal PA to request that orders be sent to clinic at d/c. Met with pt on Wednesday to provide all these details and schedule letter provided as well. During conversation pt was very focused on social issues and did not want to discuss HD arrangements beyond the basic info. Will assist as needed.   Olivia Canter Renal Navigator [phone]

## 2022-11-01 NOTE — Procedures (Signed)
I was present at this dialysis session. I have reviewed the session itself and made appropriate changes.   Filed Weights   10/31/22 0602 11/01/22 0245 11/01/22 0827  Weight: 50.2 kg 52.4 kg 52.4 kg    Recent Labs  Lab 11/01/22 0051  NA 132*  K 5.0  CL 96*  CO2 24  GLUCOSE 111*  BUN 55*  CREATININE 3.47*  CALCIUM 8.1*  PHOS 4.7*    Recent Labs  Lab 10/25/22 1351 10/25/22 1354 10/25/22 2053 10/26/22 0622 10/30/22 0804 10/30/22 0904 10/31/22 0831 11/01/22 0051  WBC 12.5*  --  9.1   < > 7.9  --  7.6 7.1  NEUTROABS 11.4*  --  8.2*  --   --   --   --   --   HGB 6.5*   < > 8.1*   < > 8.0* 8.1* 7.7* 7.1*  HCT 19.3*   < > 24.3*   < > 25.0* 25.1* 24.4* 22.4*  MCV 100.0  --  94.9   < > 96.2  --  97.2 99.1  PLT 278  --  248   < > 218  --  221 240   < > = values in this interval not displayed.    Scheduled Meds:  sodium chloride   Intravenous Once   sodium chloride   Intravenous Once   atorvastatin  10 mg Oral Daily   busPIRone  10 mg Oral BID   calcitRIOL  0.5 mcg Oral Q M,W,F-HD   Chlorhexidine Gluconate Cloth  6 each Topical Q0600   [START ON 11/04/2022] darbepoetin (ARANESP) injection - DIALYSIS  150 mcg Subcutaneous Q Mon-1800   docusate sodium  100 mg Oral BID   feeding supplement  237 mL Oral BID BM   gabapentin  100 mg Oral TID   guaiFENesin  600 mg Oral BID   mometasone-formoterol  2 puff Inhalation BID   multivitamin  1 tablet Oral QHS   nicotine  21 mg Transdermal Daily   pantoprazole  40 mg Oral BID   sodium bicarbonate  1,300 mg Oral TID   sodium chloride flush  3 mL Intravenous Q12H   umeclidinium bromide  1 puff Inhalation Daily   Continuous Infusions:  sodium chloride     meropenem (MERREM) IV 500 mg (10/31/22 1745)   vancomycin     PRN Meds:.sodium chloride, acetaminophen **OR** acetaminophen, benzonatate, hydrALAZINE, hydrOXYzine, levalbuterol, metoprolol tartrate, ondansetron **OR** ondansetron (ZOFRAN) IV, oxyCODONE-acetaminophen, sodium chloride  flush, traZODone   Louie Bun,  MD 11/01/2022, 11:11 AM

## 2022-11-01 NOTE — Telephone Encounter (Signed)
Received a call from Dr. Valentino Nose inquiring about the status of surgery scheduling. Advised patient had been UTR previously. Advised, I will contact patient while at hospital to attempt to schedule.   Spoke with patient in hospital room, while inpatient at [facility]. Patient agreed to schedule surgery on 8/28. Instructions provided and she voiced understanding. States she is unsure of what her of living arrangements will be upon discharge; therefore, patient booked to stay overnight. Patient also confirmed contact numbers to be correct.   S/w Cit[facility] on unit 2C at [facility]. Requested surgery instructions letter to be provided to patient upon discharge. She voiced understanding.

## 2022-11-01 NOTE — Progress Notes (Signed)
PROGRESS NOTE  [name] ZOX:[id number] DOB: 30-Oct-1960   PCP: [name], MD  Patient is from: Homeless  DOA: 10/25/2022 LOS: 7  Chief complaints Chief Complaint  Patient presents with   Shortness of Breath     Brief Narrative / Interim history: 63 year old F with PMH of ESRD noncompliant with HD (missed HD for over a month), COPD, PAD, GERD, GIB, AVM, anemia, HLD, hep C, tobacco use disorder and homelessness presenting with shortness of breath and chest pain, and admitted for COPD exacerbation and sepsis due to bilateral pneumonia.  Started on steroid, antibiotics and nebulizers.  Urine culture with ESBL Klebsiella.  She was initially on IV cefepime and switched to IV meropenem after urine culture sensitivity.  Hemoglobin was 5.5 on admission.  Hemoccult was positive.  She was transfused 2 units with improvement.  H&H stable.  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  GI recommended advancing diet as tolerated, daily PPI, monitoring Hgb and iron studies over time.  Patient completes IV meropenem on 8/17.  She has outpatient chair for HD TTS.  Complicated disposition.  Likely motel or shelter.  TOC on board.   Subjective: Seen and examined earlier this afternoon after she returned from dialysis.  Complaining of some irritating pain at HD cath site.  Looks overwhelmed about where she could go after she leaves the hospital.  She is asking if someone can help her.  She is going through papers she was given for home resources.  Objective: Vitals:   11/01/22 1200 11/01/22 1222 11/01/22 1243 11/01/22 1632  BP: (!) 153/67 (!) 145/72 (!) 160/68 (!) 145/68  Pulse: 82 87 86 99  Resp: 20 (!) 24 15 16   Temp:  98.2 F (36.8 C)  98.2 F (36.8 C)  TempSrc:    Oral  SpO2: 99% 99% 98% 93%  Weight:      Height:        Examination:  GENERAL:  Appears frail.  No apparent distress. HEENT: MMM.  Vision and hearing grossly intact.  NECK: Supple.  No apparent JVD.  RESP:  No IWOB.  Fair aeration bilaterally.  Rhonchi bilaterally. CVS:  RRR. Heart sounds normal.  ABD/GI/GU: BS+. Abd soft, NTND.  MSK/EXT:  Moves extremities.  Significant muscle mass and subcu fat loss.  SKIN: no apparent skin lesion or wound NEURO: Awake, alert and oriented appropriately.  No apparent focal neuro deficit. PSYCH: Calm.  No distress or agitation.  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  GI recommended advancing diet as tolerated, daily PPI, monitoring Hgb and iron studies over time.  Microbiology summarized: 8/9-full RVP nonreactive 8/10-MRSA PCR screen positive 8/9-blood culture with staph epidermis with methicillin resistance in 1 out of 4 bottles. 8/9-urine culture with ESBL Klebsiella pneumonia.  Assessment and plan: Principal Problem:   Right lower lobe pneumonia Active Problems:   Metabolic acidosis   Chronic hyponatremia   Acute on chronic blood loss anemia   Acute hypoxic respiratory failure (HCC)   ESRD on dialysis-noncompliant with outpatient dialysis due to homeless [facility])   History of arteriovenous malformation (AVM)   COPD with acute exacerbation (HCC)   SIRS (systemic inflammatory response syndrome) (HCC)   Essential hypertension   Hepatitis C   Chronic disease  anemia   GERD (gastroesophageal reflux disease)   Chronic diastolic CHF (congestive heart failure) (HCC)   Premature atrial complex due to COPD exacerbation and acute hypoxic respiratory failure   Chronic pain syndrome   Angiodysplasia of upper gastrointestinal tract   Protein-calorie malnutrition, severe   Angiodysplasia of intestine with hemorrhage  Sepsis secondary to community-acquired pneumonia, bilateral pneumonia:  Culture data as above. -Continue vancomycin with HD for total of 7 days until 8/16 -Changed cefepime to meropenem on 8/12 given ESBL Klebsiella pneumonia in urine-completed 5 days course on 8/17. -Continue scheduled and as needed nebulizers nebulizers and pulmonary toilet.  COPD exacerbation: Still with significant cough.  Smokes about half to 1 pack a day.  Doubt compliance with inhalers. -Completed 5 days of Solu-Medrol. -Antibiotics as above -Continue Dulera and Incruse Ellipta  -Continue Xopenex as needed -Continue mucolytic's and antitussive -Encouraged smoking cessation. -Continue Atarax for anxiety.  Atypical chest pain/elevated troponin: Hemodynamically stable.  On room air.  She has symmetric pulses. EKG shows A-fib without RVR.  No acute ischemic changes.  Troponin elevated to 90s but flat likely from delayed clearance in the setting of ESRD.  Portable CXR with bibasilar opacity, mild indistinctness of pulmonary vasculature.  Patient is already on broad-spectrum antibiotics and dialysis.  Chest pain improved. -Continue Atarax to 25 mg 3 times daily as needed -Incentive spirometry/OOB/PT/OT -Continue PPI   Acute hypoxic respiratory failure: Resolved.  Currently on room air. -Manage pneumonia and COPD as above -Ambulatory saturation/incentive monitor/OOB/PT/OT   ESBL UTI: Urine culture with ESBL Klebsiella pneumonia. -Change IV cefepime to meropenem.  Treat for 3 days.  ESRD/metabolic acidosis/hyponatremia: Reportedly has not had dialysis in over a month.  She is homeless and has no phone.  Makes urine.  Seems like she is on Lasix at home. -Nephrology on board   Positive blood culture: Blood culture with staph epidermis with methicillin-resistant in 1 out of 4 bottles likely contaminant.   Acute on chronic blood loss anemia/GI bleed: history of recurrent GI bleed, history of AVM: H&H stable after 2 units. 8/15-enteroscopy normal esophagus, mild chronic gastritis, single AVM in  the stomach, 3 nonbleeding AVM in duodenum and 2 nonbleeding AVM in jejunum all treated with APC.  Recent Labs    10/27/22 0911 10/27/22 1340 10/27/22 1738 10/28/22 0032 10/28/22 0741 10/29/22 0110 10/30/22 0804 10/30/22 0904 10/31/22 0831 11/01/22 0051  HGB 8.6* 8.3* 9.4* 7.8* 8.8* 7.8* 8.0* 8.1* 7.7* 7.1*  -Appreciate GI recs-8/15-advance diet as tolerated, daily PPI, monitoring Hgb and iron studies over time. -May benefit from 1 unit prior to discharge. -Aranesp per nephrology -Continue Protonix  Anxiety: Reports feeling anxious.  Feels overwhelmed which is understandable given his comorbidity and homelessness.  No imminent danger.  She denies suicidal ideation.  -Continue gabapentin and BuSpar -Continue Atarax to 25 mg 3 times daily with meals -Needs outpatient follow-up with psychiatry.  Tobacco use disorder: Reports cutting down to half a pack a day recently due to shortness of breath. -Encourage cessation -Nicotine patch  Hypokalemia:  -Monitor and replenish as appropriate.  Hypoglycemia: Likely due to n.p.o. for enteroscopy.  CBG 53.  Not symptomatic.  Eating lunch. -Recheck after intervention  Homelessness: Looks overwhelmed about this. -TOC on board-likely discharge to motel for shelter.   Severe malnutrition: POA Body mass index is 18.09 kg/m. Nutrition Problem: Severe Malnutrition Etiology: chronic illness (ESRD on HD, COPD) Signs/Symptoms: severe fat depletion, severe muscle depletion, percent weight loss Percent weight loss: 23 % (over 1 year) Interventions: Refer to  RD note for recommendations   DVT prophylaxis:  SCDs Start: 10/25/22 2022  Code Status: Full code Family Communication: None at bedside Level of care: Telemetry Medical Status is: Inpatient Remains inpatient appropriate because: Pneumonia, COPD exacerbation, GI bleed and ESRD   Final disposition: Motel or shelter. Consultants:  Nephrology Gastroenterology  35 minutes with more  than 50% spent in reviewing records, counseling patient/family and coordinating care.   Sch Meds:  Scheduled Meds:  sodium chloride   Intravenous Once   sodium chloride   Intravenous Once   atorvastatin  10 mg Oral Daily   busPIRone  10 mg Oral BID   calcitRIOL  0.5 mcg Oral Q M,W,F-HD   Chlorhexidine Gluconate Cloth  6 each Topical Q0600   [START ON 11/04/2022] darbepoetin (ARANESP) injection - DIALYSIS  150 mcg Subcutaneous Q Mon-1800   docusate sodium  100 mg Oral BID   feeding supplement  237 mL Oral BID BM   gabapentin  100 mg Oral TID   guaiFENesin  600 mg Oral BID   heparin sodium (porcine)       mometasone-formoterol  2 puff Inhalation BID   multivitamin  1 tablet Oral QHS   nicotine  21 mg Transdermal Daily   pantoprazole  40 mg Oral BID   sodium bicarbonate  1,300 mg Oral TID   sodium chloride flush  3 mL Intravenous Q12H   umeclidinium bromide  1 puff Inhalation Daily   Continuous Infusions:  sodium chloride     ferric gluconate (FERRLECIT) IVPB     meropenem (MERREM) IV Stopped (10/31/22 1815)   PRN Meds:.sodium chloride, acetaminophen **OR** acetaminophen, benzonatate, heparin sodium (porcine), hydrALAZINE, hydrOXYzine, levalbuterol, metoprolol tartrate, ondansetron **OR** ondansetron (ZOFRAN) IV, oxyCODONE-acetaminophen, sodium chloride flush, traZODone  Antimicrobials: Anti-infectives (From admission, onward)    Start     Dose/Rate Route Frequency Ordered Stop   11/01/22 1800  vancomycin (VANCOREADY) IVPB 500 mg/100 mL        500 mg 100 mL/hr over 60 Minutes Intravenous Every M-W-F (Hemodialysis) 10/29/22 1308 11/01/22 1217   10/29/22 1115  vancomycin (VANCOREADY) IVPB 500 mg/100 mL        500 mg 100 mL/hr over 60 Minutes Intravenous  Once 10/29/22 1017 10/29/22 1311   10/28/22 1800  meropenem (MERREM) 500 mg in sodium chloride 0.9 % 100 mL IVPB        500 mg 200 mL/hr over 30 Minutes Intravenous Every 24 hours 10/28/22 1156 11/02/22 1759   10/28/22 1530   vancomycin (VANCOCIN) 500 mg in sodium chloride 0.9 % 100 mL IVPB  Status:  Discontinued        500 mg 105 mL/hr over 60 Minutes Intravenous  Once 10/28/22 1451 10/28/22 1500   10/28/22 1530  vancomycin (VANCOCIN) 500 mg in sodium chloride 0.9 % 100 mL IVPB        500 mg 100 mL/hr over 60 Minutes Intravenous  Once 10/28/22 1501 10/28/22 1748   10/28/22 1430  vancomycin (VANCOREADY) IVPB 500 mg/100 mL  Status:  Discontinued        500 mg 100 mL/hr over 60 Minutes Intravenous  Once 10/28/22 1332 10/28/22 1452   10/28/22 1200  vancomycin (VANCOREADY) IVPB 500 mg/100 mL  Status:  Discontinued        500 mg 100 mL/hr over 60 Minutes Intravenous Once in dialysis 10/28/22 0832 10/28/22 1332   10/26/22 1800  ceFEPIme (MAXIPIME) 1 g in sodium chloride 0.9 % 100 mL IVPB  Status:  Discontinued  1 g 200 mL/hr over 30 Minutes Intravenous Every 24 hours 10/25/22 2111 10/28/22 0733   10/26/22 1530  vancomycin (VANCOREADY) IVPB 500 mg/100 mL        500 mg 100 mL/hr over 60 Minutes Intravenous  Once 10/26/22 1442 10/26/22 1709   10/25/22 2115  vancomycin variable dose per unstable renal function (pharmacist dosing)  Status:  Discontinued         Does not apply See admin instructions 10/25/22 2115 10/30/22 1347   10/25/22 1815  ceFEPIme (MAXIPIME) 1 g in sodium chloride 0.9 % 100 mL IVPB        1 g 200 mL/hr over 30 Minutes Intravenous  Once 10/25/22 1806 10/25/22 1925   10/25/22 1800  vancomycin (VANCOCIN) IVPB 1000 mg/200 mL premix        1,000 mg 200 mL/hr over 60 Minutes Intravenous  Once 10/25/22 1757 10/25/22 2003        I have personally reviewed the following labs and images: CBC: Recent Labs  Lab 10/25/22 2053 10/26/22 0622 10/28/22 0741 10/29/22 0110 10/30/22 0804 10/30/22 0904 10/31/22 0831 11/01/22 0051  WBC 9.1   < > 7.9 8.3 7.9  --  7.6 7.1  NEUTROABS 8.2*  --   --   --   --   --   --   --   HGB 8.1*   < > 8.8* 7.8* 8.0* 8.1* 7.7* 7.1*  HCT 24.3*   < > 26.0* 23.4*  25.0* 25.1* 24.4* 22.4*  MCV 94.9   < > 90.6 92.5 96.2  --  97.2 99.1  PLT 248   < > 192 191 218  --  221 240   < > = values in this interval not displayed.   BMP &GFR Recent Labs  Lab 10/25/22 2053 10/26/22 0806 10/26/22 1810 10/27/22 0032 10/28/22 0032 10/29/22 0110 10/30/22 0312 11/01/22 0051  NA 130*   < >  --  129* 129* 134* 134* 132*  K 3.4*   < >  --  3.4* 3.6 3.3* 4.3 5.0  CL 102   < >  --  91* 94* 96* 97* 96*  CO2 11*   < >  --  19* 19* 26 24 24   GLUCOSE 99   < >  --  142* 122* 125* 72 111*  BUN 74*   < >  --  42* 70* 35* 56* 55*  CREATININE 4.69*   < >  --  2.93* 4.21* 2.44* 3.25* 3.47*  CALCIUM 7.8*   < >  --  8.0* 7.8* 7.3* 7.4* 8.1*  MG  --   --  1.8 2.0  --  1.6* 1.9 1.8  PHOS 5.8*  --   --   --   --  2.7 3.1 4.7*   < > = values in this interval not displayed.   Estimated Creatinine Clearance: 13.9 mL/min (A) (by C-G formula based on SCr of 3.47 mg/dL (H)). Liver & Pancreas: Recent Labs  Lab 10/27/22 0032 10/28/22 0032 10/29/22 0110 10/30/22 0312 11/01/22 0051  AST 26 33  --   --   --   ALT 13 15  --   --   --   ALKPHOS 55 48  --   --   --   BILITOT 0.4 0.7  --   --   --   PROT 5.2* 5.5*  --   --   --   ALBUMIN 1.9* 1.9* 1.8* 1.8* 1.8*   No results for input(s): "  LIPASE", "AMYLASE" in the last 168 hours.  No results for input(s): "AMMONIA" in the last 168 hours. Diabetic: No results for input(s): "HGBA1C" in the last 72 hours.  Recent Labs  Lab 10/31/22 2106 11/01/22 0602 11/01/22 1338 11/01/22 1414 11/01/22 1629  GLUCAP 92 87 48* 114* 83   Cardiac Enzymes: No results for input(s): "CKTOTAL", "CKMB", "CKMBINDEX", "TROPONINI" in the last 168 hours. No results for input(s): "PROBNP" in the last 8760 hours. Coagulation Profile: No results for input(s): "INR", "PROTIME" in the last 168 hours. Thyroid Function Tests: No results for input(s): "TSH", "T4TOTAL", "FREET4", "T3FREE", "THYROIDAB" in the last 72 hours. Lipid Profile: No results for  input(s): "CHOL", "HDL", "LDLCALC", "TRIG", "CHOLHDL", "LDLDIRECT" in the last 72 hours.  Anemia Panel: No results for input(s): "VITAMINB12", "FOLATE", "FERRITIN", "TIBC", "IRON", "RETICCTPCT" in the last 72 hours.  Urine analysis:    Component Value Date/Time   COLORURINE YELLOW 10/25/2022 2015   APPEARANCEUR HAZY (A) 10/25/2022 2015   LABSPEC 1.009 10/25/2022 2015   PHURINE 5.0 10/25/2022 2015   GLUCOSEU 50 (A) 10/25/2022 2015   HGBUR LARGE (A) 10/25/2022 2015   BILIRUBINUR NEGATIVE 10/25/2022 2015   KETONESUR NEGATIVE 10/25/2022 2015   PROTEINUR 100 (A) 10/25/2022 2015   UROBILINOGEN 0.2 03/02/2010 2027   NITRITE POSITIVE (A) 10/25/2022 2015   LEUKOCYTESUR MODERATE (A) 10/25/2022 2015   Sepsis Labs: Invalid input(s): "PROCALCITONIN", "LACTICIDVEN"  Microbiology: Recent Results (from the past 240 hour(s))  Blood culture (routine x 2)     Status: Abnormal   Collection Time: 10/25/22  1:51 PM   Specimen: BLOOD  Result Value Ref Range Status   Specimen Description BLOOD LEFT ANTECUBITAL  Final   Special Requests   Final    BOTTLES DRAWN AEROBIC ONLY Blood Culture results may not be optimal due to an excessive volume of blood received in culture bottles   Culture  Setup Time   Final    GRAM POSITIVE COCCI GRAM POSITIVE RODS AEROBIC BOTTLE ONLY CRITICAL RESULT CALLED TO, READ BACK BY AND VERIFIED WITH: PHARMD MADISON OWEN [phone] 1656 BY J RAZZAK, MT    Culture (A)  Final    STAPHYLOCOCCUS EPIDERMIDIS THE SIGNIFICANCE OF ISOLATING THIS ORGANISM FROM A SINGLE SET OF BLOOD CULTURES WHEN MULTIPLE SETS ARE DRAWN IS UNCERTAIN. PLEASE NOTIFY THE MICROBIOLOGY DEPARTMENT WITHIN ONE WEEK IF SPECIATION AND SENSITIVITIES ARE REQUIRED. CORYNEBACTERIUM, GROUP JK Standardized susceptibility testing for this organism is not available. Performed at [facility], [address], [place], [place] [address]    Report Status 10/29/2022 FINAL  Final  Blood Culture ID Panel (Reflexed)      Status: Abnormal   Collection Time: 10/25/22  1:51 PM  Result Value Ref Range Status   Enterococcus faecalis NOT DETECTED NOT DETECTED Final   Enterococcus Faecium NOT DETECTED NOT DETECTED Final   Listeria monocytogenes NOT DETECTED NOT DETECTED Final   Staphylococcus species DETECTED (A) NOT DETECTED Final    Comment: CRITICAL RESULT CALLED TO, READ BACK BY AND VERIFIED WITH: PHARMD MADISON OWEN [id number] 1656 BY J RAZZAK, MT    Staphylococcus aureus (BCID) NOT DETECTED NOT DETECTED Final   Staphylococcus epidermidis DETECTED (A) NOT DETECTED Final    Comment: Methicillin (oxacillin) resistant coagulase negative staphylococcus. Possible blood culture contaminant (unless isolated from more than one blood culture draw or clinical case suggests pathogenicity). No antibiotic treatment is indicated for blood  culture contaminants. CRITICAL RESULT CALLED TO, READ BACK BY AND VERIFIED WITH: PHARMD MADISON OWEN [phone] 1656 BY J  RAZZAK,MT    Staphylococcus lugdunensis NOT DETECTED NOT DETECTED Final   Streptococcus species NOT DETECTED NOT DETECTED Final   Streptococcus agalactiae NOT DETECTED NOT DETECTED Final   Streptococcus pneumoniae NOT DETECTED NOT DETECTED Final   Streptococcus pyogenes NOT DETECTED NOT DETECTED Final   A.calcoaceticus-baumannii NOT DETECTED NOT DETECTED Final   Bacteroides fragilis NOT DETECTED NOT DETECTED Final   Enterobacterales NOT DETECTED NOT DETECTED Final   Enterobacter cloacae complex NOT DETECTED NOT DETECTED Final   Escherichia coli NOT DETECTED NOT DETECTED Final   Klebsiella aerogenes NOT DETECTED NOT DETECTED Final   Klebsiella oxytoca NOT DETECTED NOT DETECTED Final   Klebsiella pneumoniae NOT DETECTED NOT DETECTED Final   Proteus species NOT DETECTED NOT DETECTED Final   Salmonella species NOT DETECTED NOT DETECTED Final   Serratia marcescens NOT DETECTED NOT DETECTED Final   Haemophilus influenzae NOT DETECTED NOT DETECTED Final   Neisseria  meningitidis NOT DETECTED NOT DETECTED Final   Pseudomonas aeruginosa NOT DETECTED NOT DETECTED Final   Stenotrophomonas maltophilia NOT DETECTED NOT DETECTED Final   Candida albicans NOT DETECTED NOT DETECTED Final   Candida auris NOT DETECTED NOT DETECTED Final   Candida glabrata NOT DETECTED NOT DETECTED Final   Candida krusei NOT DETECTED NOT DETECTED Final   Candida parapsilosis NOT DETECTED NOT DETECTED Final   Candida tropicalis NOT DETECTED NOT DETECTED Final   Cryptococcus neoformans/gattii NOT DETECTED NOT DETECTED Final   Methicillin resistance mecA/C DETECTED (A) NOT DETECTED Final    Comment: CRITICAL RESULT CALLED TO, READ BACK BY AND VERIFIED WITH: PHARMD MADISON OWEN [id number] 1656 BY Berline Chough, MT Performed at [facility], [address], [place], [place] [address]   Blood culture (routine x 2)     Status: None   Collection Time: 10/25/22  3:03 PM   Specimen: BLOOD LEFT FOREARM  Result Value Ref Range Status   Specimen Description BLOOD LEFT FOREARM  Final   Special Requests   Final    BOTTLES DRAWN AEROBIC AND ANAEROBIC Blood Culture results may not be optimal due to an excessive volume of blood received in culture bottles   Culture   Final    NO GROWTH 5 DAYS Performed at [facility], [address], [place], [place] [address]    Report Status 10/30/2022 FINAL  Final  Urine Culture     Status: Abnormal   Collection Time: 10/25/22  8:15 PM   Specimen: Urine, Clean Catch  Result Value Ref Range Status   Specimen Description URINE, CLEAN CATCH  Final   Special Requests   Final    NONE Reflexed from [id number] Performed at [facility], [address] [place], [place] [address]    Culture (A)  Final    >=100,000 COLONIES/mL KLEBSIELLA PNEUMONIAE Confirmed Extended Spectrum Beta-Lactamase Producer (ESBL).  In bloodstream infections from ESBL organisms, carbapenems are preferred over piperacillin/tazobactam. They are shown to have a lower risk of  mortality.    Report Status 10/27/2022 FINAL  Final   Organism ID, Bacteria KLEBSIELLA PNEUMONIAE (A)  Final      Susceptibility   Klebsiella pneumoniae - MIC*    AMPICILLIN >=32 RESISTANT Resistant     CEFAZOLIN >=64 RESISTANT Resistant     CEFEPIME >=32 RESISTANT Resistant     CEFTRIAXONE >=64 RESISTANT Resistant     CIPROFLOXACIN 0.5 INTERMEDIATE Intermediate     GENTAMICIN >=16 RESISTANT Resistant     IMIPENEM <=0.25 SENSITIVE Sensitive     NITROFURANTOIN  128 RESISTANT Resistant     TRIMETH/SULFA >=320 RESISTANT Resistant     AMPICILLIN/SULBACTAM >=32 RESISTANT Resistant     PIP/TAZO <=4 SENSITIVE Sensitive     * >=100,000 COLONIES/mL KLEBSIELLA PNEUMONIAE  Respiratory (~20 pathogens) panel by PCR     Status: None   Collection Time: 10/25/22  9:07 PM   Specimen: Nasopharyngeal Swab; Respiratory  Result Value Ref Range Status   Adenovirus NOT DETECTED NOT DETECTED Final   Coronavirus 229E NOT DETECTED NOT DETECTED Final    Comment: (NOTE) The Coronavirus on the Respiratory Panel, DOES NOT test for the novel  Coronavirus (2019 nCoV)    Coronavirus HKU1 NOT DETECTED NOT DETECTED Final   Coronavirus NL63 NOT DETECTED NOT DETECTED Final   Coronavirus OC43 NOT DETECTED NOT DETECTED Final   Metapneumovirus NOT DETECTED NOT DETECTED Final   Rhinovirus / Enterovirus NOT DETECTED NOT DETECTED Final   Influenza A NOT DETECTED NOT DETECTED Final   Influenza B NOT DETECTED NOT DETECTED Final   Parainfluenza Virus 1 NOT DETECTED NOT DETECTED Final   Parainfluenza Virus 2 NOT DETECTED NOT DETECTED Final   Parainfluenza Virus 3 NOT DETECTED NOT DETECTED Final   Parainfluenza Virus 4 NOT DETECTED NOT DETECTED Final   Respiratory Syncytial Virus NOT DETECTED NOT DETECTED Final   Bordetella pertussis NOT DETECTED NOT DETECTED Final   Bordetella Parapertussis NOT DETECTED NOT DETECTED Final   Chlamydophila pneumoniae NOT DETECTED NOT DETECTED Final   Mycoplasma pneumoniae NOT DETECTED NOT  DETECTED Final    Comment: Performed at [facility], [address], [place], [place] [address]  MRSA Next Gen by PCR, Nasal     Status: Abnormal   Collection Time: 10/26/22  2:58 AM   Specimen: Nasal Mucosa; Nasal Swab  Result Value Ref Range Status   MRSA by PCR Next Gen DETECTED (A) NOT DETECTED Final    Comment: RESULT CALLED TO, READ BACK BY AND VERIFIED WITH: S. [place] RN 10/26/22 @ 0404 BY AB (NOTE) The GeneXpert MRSA Assay (FDA approved for NASAL specimens only), is one component of a comprehensive MRSA colonization surveillance program. It is not intended to diagnose MRSA infection nor to guide or monitor treatment for MRSA infections. Test performance is not FDA approved in patients less than 48 years old. Performed at [facility], [address] [place], [place] [address]     Radiology Studies: No results found.    Gayland Nicol T. Hilarie Sinha [facility]  If 7PM-7AM, please contact night-coverage [email or web address] 11/01/2022, 4:55 PM

## 2022-11-01 NOTE — Anesthesia Postprocedure Evaluation (Signed)
Anesthesia Post Note  Patient: [name]  Procedure(s) Performed: ENTEROSCOPY HOT HEMOSTASIS (ARGON PLASMA COAGULATION/BICAP)     Patient location during evaluation: PACU Anesthesia Type: MAC Level of consciousness: awake and alert Pain management: pain level controlled Vital Signs Assessment: post-procedure vital signs reviewed and stable Respiratory status: spontaneous breathing, nonlabored ventilation, respiratory function stable and patient connected to nasal cannula oxygen Cardiovascular status: stable and blood pressure returned to baseline Postop Assessment: no apparent nausea or vomiting Anesthetic complications: no   No notable events documented.  Last Vitals:  Vitals:   11/01/22 0742 11/01/22 0827  BP: (!) 158/72 (!) 146/63  Pulse: 92 92  Resp: 17 16  Temp: 36.8 C 36.6 C  SpO2: 91% 97%    Last Pain:  Vitals:   11/01/22 0742  TempSrc: Oral  PainSc: 6                  Yuya Vanwingerden P Dickie Labarre

## 2022-11-01 NOTE — Plan of Care (Signed)

## 2022-11-01 NOTE — Progress Notes (Addendum)
[facility] Progress Note   Subjective:   Patient continues to be frustrated regarding social situations.  Her dialysis catheter in her neck is bothering her and we discussed there is little we can do about that.  Tolerating dialysis well  Objective Vitals:   11/01/22 0849 11/01/22 0930 11/01/22 1000 11/01/22 1030  BP: (!) 167/78 129/65 139/64 (!) 140/68  Pulse: 87 85 82 79  Resp: 19 16 (!) 24 20  Temp:      TempSrc:      SpO2: 95% 97% 99% 97%  Weight:      Height:       Physical Exam General: Alert female, chronically ill appearing, NAD Heart: Normal rate, no rub Lungs: Bilateral chest rise with no increased work of breathing Abdomen: Soft, non-distended, +BS Extremities: No edema b/l lower extremities Dialysis Access:  TDC, RUE AVF +bruit  Additional Objective Labs: Basic Metabolic Panel: Recent Labs  Lab 10/29/22 0110 10/30/22 0312 11/01/22 0051  NA 134* 134* 132*  K 3.3* 4.3 5.0  CL 96* 97* 96*  CO2 26 24 24   GLUCOSE 125* 72 111*  BUN 35* 56* 55*  CREATININE 2.44* 3.25* 3.47*  CALCIUM 7.3* 7.4* 8.1*  PHOS 2.7 3.1 4.7*   Liver Function Tests: Recent Labs  Lab 10/25/22 1351 10/25/22 2053 10/27/22 0032 10/28/22 0032 10/29/22 0110 10/30/22 0312 11/01/22 0051  AST 23  --  26 33  --   --   --   ALT 15  --  13 15  --   --   --   ALKPHOS 76  --  55 48  --   --   --   BILITOT 0.6  --  0.4 0.7  --   --   --   PROT 6.3*  --  5.2* 5.5*  --   --   --   ALBUMIN 2.5*   < > 1.9* 1.9* 1.8* 1.8* 1.8*   < > = values in this interval not displayed.   Recent Labs  Lab 10/25/22 1351  LIPASE 41   CBC: Recent Labs  Lab 10/25/22 1351 10/25/22 1354 10/25/22 2053 10/26/22 0622 10/28/22 0741 10/29/22 0110 10/30/22 0804 10/30/22 0904 10/31/22 0831 11/01/22 0051  WBC 12.5*  --  9.1   < > 7.9 8.3 7.9  --  7.6 7.1  NEUTROABS 11.4*  --  8.2*  --   --   --   --   --   --   --   HGB 6.5*   < > 8.1*   < > 8.8* 7.8* 8.0* 8.1* 7.7* 7.1*  HCT 19.3*   <  > 24.3*   < > 26.0* 23.4* 25.0* 25.1* 24.4* 22.4*  MCV 100.0  --  94.9   < > 90.6 92.5 96.2  --  97.2 99.1  PLT 278  --  248   < > 192 191 218  --  221 240   < > = values in this interval not displayed.   Blood Culture    Component Value Date/Time   SDES URINE, CLEAN CATCH 10/25/2022 2015   SPECREQUEST  10/25/2022 2015    NONE Reflexed from [id number] Performed at [facility], [address], [place], [place] [address]    CULT (A) 10/25/2022 2015    >=100,000 COLONIES/mL KLEBSIELLA PNEUMONIAE Confirmed Extended Spectrum Beta-Lactamase Producer (ESBL).  In bloodstream infections from ESBL organisms, carbapenems are preferred over piperacillin/tazobactam. They are shown to have a lower risk of mortality.  REPTSTATUS 10/27/2022 FINAL 10/25/2022 2015    Cardiac Enzymes: No results for input(s): "CKTOTAL", "CKMB", "CKMBINDEX", "TROPONINI" in the last 168 hours. CBG: Recent Labs  Lab 10/31/22 0558 10/31/22 1153 10/31/22 1653 10/31/22 2106 11/01/22 0602  GLUCAP 77 53* 75 92 87   Iron Studies:  No results for input(s): "IRON", "TIBC", "TRANSFERRIN", "FERRITIN" in the last 72 hours.  @lablastinr3 @ Studies/Results: DG Chest Port 1 View  Result Date: 10/30/2022 CLINICAL DATA:  Chest pain EXAM: PORTABLE CHEST 1 VIEW COMPARISON:  10/25/2022 FINDINGS: The patient is rotated to the right on today's radiograph, reducing diagnostic sensitivity and specificity. Dual lumen right IJ catheter tip: SVC. Indistinct right hemidiaphragm compatible with consolidation. Confluent bandlike opacities at the left lung base. Mild indistinctness of the pulmonary vasculature. Reverse lordotic projection. Heart size believed to be within normal limits. IMPRESSION: 1. Right lower lobe airspace opacity suspicious for pneumonia. 2. Confluent bandlike opacities at the left lung base, potentially from atelectasis or pneumonia. 3. Mild indistinctness of the pulmonary vasculature, potentially from pulmonary venous  congestion. Electronically Signed   By: Gaylyn Rong M.D.   On: 10/30/2022 18:44   Medications:  sodium chloride     meropenem (MERREM) IV 500 mg (10/31/22 1745)   vancomycin      sodium chloride   Intravenous Once   sodium chloride   Intravenous Once   atorvastatin  10 mg Oral Daily   busPIRone  10 mg Oral BID   calcitRIOL  0.5 mcg Oral Q M,W,F-HD   Chlorhexidine Gluconate Cloth  6 each Topical Q0600   [START ON 11/04/2022] darbepoetin (ARANESP) injection - DIALYSIS  150 mcg Subcutaneous Q Mon-1800   docusate sodium  100 mg Oral BID   feeding supplement  237 mL Oral BID BM   gabapentin  100 mg Oral TID   guaiFENesin  600 mg Oral BID   mometasone-formoterol  2 puff Inhalation BID   multivitamin  1 tablet Oral QHS   nicotine  21 mg Transdermal Daily   pantoprazole  40 mg Oral BID   sodium bicarbonate  1,300 mg Oral TID   sodium chloride flush  3 mL Intravenous Q12H   umeclidinium bromide  1 puff Inhalation Daily    OP Dialysis Orders: Previously at [facility], unable to obtain records given she was discharged  Assessment/Plan: Uremia/ESRD- She reports she missed 1 month of dialysis but was feeling well until this week.  Requirements for dialysis on arrival were acidosis and volume overload.  Maintain MWF schedule for now.  She has been accepted to [place] [facility] but she does not know where she will live and how she will get to dialysis.  Appreciate help from social work Acute hypoxic respiratory failure: Possibly pneumonia but also volume overloaded.  Has improved.  Ultrafiltration with dialysis. Metabolic acidosis: Related to renal failure.  Improved with dialysis and supplementation HTN/ volume -improved with dialysis and volume removal Anemia esrd -underwent EGD on 8/15 with multiple angioectasias.  Transfusion as needed.  Aranesp increased due on Monday. Will order IV iron load as well Secondary hyperparathyroidism: Started calcitriol.  Continue to monitor Dialysis access-  Has [facility], looks like she missed calls from vascular surgery to schedule second stage surgery.  I called surgery scheduler today and they are awaiting a call from the patient to schedule her surgery

## 2022-11-02 ENCOUNTER — Other Ambulatory Visit (HOSPITAL_COMMUNITY): Payer: Self-pay

## 2022-11-02 DIAGNOSIS — D62 Acute posthemorrhagic anemia: Secondary | ICD-10-CM | POA: Diagnosis not present

## 2022-11-02 DIAGNOSIS — J9601 Acute respiratory failure with hypoxia: Secondary | ICD-10-CM | POA: Diagnosis not present

## 2022-11-02 DIAGNOSIS — J189 Pneumonia, unspecified organism: Secondary | ICD-10-CM | POA: Diagnosis not present

## 2022-11-02 DIAGNOSIS — K31819 Angiodysplasia of stomach and duodenum without bleeding: Secondary | ICD-10-CM | POA: Diagnosis not present

## 2022-11-02 LAB — RENAL FUNCTION PANEL
Albumin: 2 g/dL — ABNORMAL LOW (ref 3.5–5.0)
Anion gap: 12 (ref 5–15)
BUN: 28 mg/dL — ABNORMAL HIGH (ref 8–23)
CO2: 29 mmol/L (ref 22–32)
Calcium: 8.4 mg/dL — ABNORMAL LOW (ref 8.9–10.3)
Chloride: 94 mmol/L — ABNORMAL LOW (ref 98–111)
Creatinine, Ser: 2.43 mg/dL — ABNORMAL HIGH (ref 0.44–1.00)
GFR, Estimated: 22 mL/min — ABNORMAL LOW (ref >60)
Glucose, Bld: 86 mg/dL (ref 70–99)
Phosphorus: 4.6 mg/dL (ref 2.5–4.6)
Potassium: 5 mmol/L (ref 3.5–5.1)
Sodium: 135 mmol/L (ref 135–145)

## 2022-11-02 LAB — CBC
HCT: 25.7 % — ABNORMAL LOW (ref 36.0–46.0)
Hemoglobin: 8.1 g/dL — ABNORMAL LOW (ref 12.0–15.0)
MCH: 31 pg (ref 26.0–34.0)
MCHC: 31.5 g/dL (ref 30.0–36.0)
MCV: 98.5 fL (ref 80.0–100.0)
Platelets: 303 10*3/uL (ref 150–400)
RBC: 2.61 MIL/uL — ABNORMAL LOW (ref 3.87–5.11)
RDW: 18.5 % — ABNORMAL HIGH (ref 11.5–15.5)
WBC: 7 10*3/uL (ref 4.0–10.5)
nRBC: 0 % (ref 0.0–0.2)

## 2022-11-02 LAB — GLUCOSE, CAPILLARY
Glucose-Capillary: 78 mg/dL (ref 70–99)
Glucose-Capillary: 78 mg/dL (ref 70–99)
Glucose-Capillary: 100 mg/dL — ABNORMAL HIGH (ref 70–99)

## 2022-11-02 LAB — MAGNESIUM: Magnesium: 1.8 mg/dL (ref 1.7–2.4)

## 2022-11-02 MED ORDER — SODIUM BICARBONATE 650 MG PO TABS
650.0000 mg | ORAL_TABLET | Freq: Three times a day (TID) | ORAL | 3 refills | Status: DC
  Filled 2022-11-02: qty 90, 30d supply, fill #0

## 2022-11-02 MED ORDER — ATORVASTATIN CALCIUM 10 MG PO TABS
10.0000 mg | ORAL_TABLET | Freq: Every day | ORAL | 2 refills | Status: DC
  Filled 2022-11-02: qty 30, 30d supply, fill #0

## 2022-11-02 MED ORDER — HYDROXYZINE HCL 25 MG PO TABS
25.0000 mg | ORAL_TABLET | Freq: Three times a day (TID) | ORAL | 0 refills | Status: DC | PRN
  Filled 2022-11-02: qty 90, 30d supply, fill #0

## 2022-11-02 MED ORDER — GABAPENTIN 100 MG PO CAPS
100.0000 mg | ORAL_CAPSULE | Freq: Three times a day (TID) | ORAL | 3 refills | Status: DC
Start: 2022-11-02 — End: 2023-02-01
  Filled 2022-11-02: qty 90, 30d supply, fill #0

## 2022-11-02 MED ORDER — FUROSEMIDE 80 MG PO TABS
80.0000 mg | ORAL_TABLET | ORAL | 2 refills | Status: DC
Start: 2022-11-04 — End: 2023-02-01
  Filled 2022-11-02: qty 30, 70d supply, fill #0

## 2022-11-02 MED ORDER — ALBUTEROL SULFATE HFA 108 (90 BASE) MCG/ACT IN AERS
2.0000 | INHALATION_SPRAY | Freq: Four times a day (QID) | RESPIRATORY_TRACT | 3 refills | Status: DC | PRN
  Filled 2022-11-02: qty 18, 25d supply, fill #0

## 2022-11-02 MED ORDER — TRAZODONE HCL 50 MG PO TABS
50.0000 mg | ORAL_TABLET | Freq: Every evening | ORAL | 3 refills | Status: DC | PRN
  Filled 2022-11-02: qty 30, 30d supply, fill #0

## 2022-11-02 MED ORDER — PANTOPRAZOLE SODIUM 40 MG PO TBEC
DELAYED_RELEASE_TABLET | ORAL | 0 refills | Status: DC
  Filled 2022-11-02: qty 120, 90d supply, fill #0

## 2022-11-02 MED ORDER — TRELEGY ELLIPTA 100-62.5-25 MCG/ACT IN AEPB
1.0000 | INHALATION_SPRAY | Freq: Every day | RESPIRATORY_TRACT | 3 refills | Status: DC
Start: 2022-11-02 — End: 2023-02-01
  Filled 2022-11-02: qty 60, 30d supply, fill #0

## 2022-11-02 MED ORDER — RENA-VITE PO TABS
1.0000 | ORAL_TABLET | Freq: Every day | ORAL | 0 refills | Status: DC
Start: 2022-11-02 — End: 2023-02-01
  Filled 2022-11-02: qty 100, 100d supply, fill #0

## 2022-11-02 MED ORDER — BUSPIRONE HCL 10 MG PO TABS
10.0000 mg | ORAL_TABLET | Freq: Two times a day (BID) | ORAL | 3 refills | Status: DC
Start: 2022-11-02 — End: 2023-02-01
  Filled 2022-11-02: qty 60, 30d supply, fill #0

## 2022-11-02 MED ORDER — FERROUS SULFATE 325 (65 FE) MG PO TABS
325.0000 mg | ORAL_TABLET | Freq: Every day | ORAL | 3 refills | Status: DC
Start: 2022-11-02 — End: 2023-02-01
  Filled 2022-11-02: qty 100, 100d supply, fill #0

## 2022-11-02 NOTE — Progress Notes (Signed)
[facility] Progress Note   Subjective:   Patient continues to be frustrated regarding social situations.  Her dialysis catheter in her neck is bothering her and we discussed there is little we can do about that.  Tolerating dialysis well  Objective Vitals:   11/02/22 0322 11/02/22 0745 11/02/22 0746 11/02/22 0815  BP: (!) 161/84   130/74  Pulse: 93   95  Resp: 18   20  Temp: 97.8 F (36.6 C)   97.9 F (36.6 C)  TempSrc: Oral   Oral  SpO2: 93% 94% 94% 94%  Weight: 51.5 kg     Height:       Physical Exam General: Alert female, chronically ill appearing, NAD Heart: Normal rate, no rub Lungs: Bilateral chest rise with no increased work of breathing Abdomen: Soft, non-distended, +BS Extremities: No edema b/l lower extremities Dialysis Access:  TDC, RUE AVF +bruit   OP Dialysis Orders: Previously at [facility], unable to obtain records given she was discharged  Assessment/Plan: Uremia/ESRD- She reports she missed 1 month of dialysis but was feeling well until this week.  Requirements for dialysis on arrival were acidosis and volume overload.  Maintain MWF schedule for now.  She has been accepted to [place] [facility] but she does not know where she will live and how she will get to dialysis.  Appreciate help from social work Social situation - very poor w/o any support, she was just accepted as a Medicaid patient. Will ask pmd if there is possibility of SNF placement. If dc'd from hospital she will not be able to make it to OP dialysis as she is homeless and w/o support systems.   Acute hypoxic respiratory failure: Possibly pneumonia but also volume overloaded.  Has improved.  Ultrafiltration with dialysis. Metabolic acidosis: Related to renal failure.  Improved with dialysis and supplementation HTN/ volume -improved with dialysis and volume removal Anemia esrd -underwent EGD on 8/15 with multiple angioectasias.  Transfusion as needed.  Aranesp increased due on Monday. Will  order IV iron load as well Secondary hyperparathyroidism: Started calcitriol.  Continue to monitor Dialysis access- Has [facility], looks like she missed calls from vascular surgery to schedule second stage surgery on RUE access.  VVS saw here and gave her a date for her access surgery which will be 8/28.   Vinson Moselle  MD  [facility] 11/02/2022, 10:18 AM  Recent Labs  Lab 11/01/22 0051 11/02/22 0328  HGB 7.1* 8.1*  ALBUMIN 1.8* 2.0*  CALCIUM 8.1* 8.4*  PHOS 4.7* 4.6  CREATININE 3.47* 2.43*  K 5.0 5.0    Inpatient medications:  sodium chloride   Intravenous Once   sodium chloride   Intravenous Once   atorvastatin  10 mg Oral Daily   busPIRone  10 mg Oral BID   calcitRIOL  0.5 mcg Oral Q M,W,F-HD   Chlorhexidine Gluconate Cloth  6 each Topical Q0600   [START ON 11/04/2022] darbepoetin (ARANESP) injection - DIALYSIS  150 mcg Subcutaneous Q Mon-1800   docusate sodium  100 mg Oral BID   feeding supplement  237 mL Oral BID BM   gabapentin  100 mg Oral TID   guaiFENesin  600 mg Oral BID   mometasone-formoterol  2 puff Inhalation BID   multivitamin  1 tablet Oral QHS   nicotine  21 mg Transdermal Daily   pantoprazole  40 mg Oral BID   sodium bicarbonate  1,300 mg Oral TID   sodium chloride flush  3 mL Intravenous Q12H  umeclidinium bromide  1 puff Inhalation Daily    sodium chloride     ferric gluconate (FERRLECIT) IVPB 250 mg (11/01/22 1846)   sodium chloride, acetaminophen **OR** acetaminophen, benzonatate, hydrALAZINE, hydrOXYzine, levalbuterol, metoprolol tartrate, ondansetron **OR** ondansetron (ZOFRAN) IV, oxyCODONE-acetaminophen, sodium chloride flush, traZODone

## 2022-11-02 NOTE — Progress Notes (Signed)
Went over discharge paperwork with patient. All questions answered. PIV removed and all personal belongings at beside. TOC meds delivered.

## 2022-11-02 NOTE — TOC Progression Note (Addendum)
Transition of Care [facility]) - Progression Note    Patient Details  Name: [name] MRN: [id number] Date of Birth: 22-Jan-1961  [facility] CM/SW Contact  Patrice Paradise, LCSW Phone Number: 11/02/2022, 1:56 PM  Clinical Narrative:     CSW was asked by MD to follow up with pt due to pt reporting that she has medicaid and if pt has any interest in LTC facility. CSW asked pt if she in possibly going to a LTC facility with the understanding that any funds received would be given to the LTC facility. Pt stated that she was not open to going to a rest home and plans to be DC with friend Iantha Fallen at [employer] and she will figure it out herself. P stated that she will need a bus pass. Pt declined the idea of a LTC facility. MD alerted of pt comments.  2;21pm- CSW provided pt with cab [facility]  [facility] team will continue to assist with discharge planning needs.   Expected Discharge Plan: Homeless Shelter Barriers to Discharge: Continued Medical Work up  Expected Discharge Plan and Services                                               Social Determinants of Health (SDOH) Interventions SDOH Screenings   Food Insecurity: No Food Insecurity (08/13/2022)  Housing: Patient Unable To Answer (08/13/2022)  Transportation Needs: No Transportation Needs (08/13/2022)  Recent Concern: Transportation Needs - Unmet Transportation Needs (08/02/2022)  Utilities: Not At Risk (08/13/2022)  Depression (PHQ2-9): Medium Risk (01/21/2022)  Tobacco Use: High Risk (10/31/2022)    Readmission Risk Interventions    08/13/2022    4:47 PM 08/02/2022    1:56 PM 11/16/2021   12:16 PM  Readmission Risk Prevention Plan  Transportation Screening Complete Complete Complete  PCP or Specialist Appt within 3-5 Days   Complete  HRI or Home Care Consult   Complete  Social Work Consult for Recovery Care Planning/Counseling   Complete  Palliative Care Screening   Not Applicable   Medication Review [employer]) Complete Referral to Pharmacy Complete  PCP or Specialist appointment within 3-5 days of discharge Complete Complete   HRI or Home Care Consult Complete Complete   SW Recovery Care/Counseling Consult  Complete   Palliative Care Screening Not Applicable Not Applicable   Skilled Nursing Facility Not Applicable Not Applicable

## 2022-11-02 NOTE — Plan of Care (Signed)
 [facility]  Initial Hemodialysis Orders  Dialysis center: [facility]  Patient's name: [name] DOB: 1961-01-03  ESRD  Discharge diagnosis: 1. Sepsis 2/2 PNA/UTI  - completed course of antibiotics  2. ABLA/GIB  3. Uremia 2/2 missed dialysis  Allergies:  Allergies  Allergen Reactions   Zestril [Lisinopril] Other (See Comments)    Hyperkalemia    Date of First Dialysis: --- Cause of renal disease: Unclear, progressive CKD   Dialysis Prescription: Dialysis Frequency: TIW Tx duration: 3:45 BFR: 400 DFR: 600 EDW: 52 kg   Dialyzer: 180NRe UF profile/Sodium modeling?: -- Dialysis Bath: 2 K 2 Ca  Dialysis access: TDC /  R BCF AVF placed 08/14/22 Dr. 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    In Center Medications: Heparin Dose: None    VDRA: Calcitriol  0.5 mcg PO   q HD Venofer: 100 mg IV x 8  Mircera: 60 mcg IV q 2 wks    Discharge labs: Hgb: 8.1.      K+: 5.0  Ca: 8.4   Phos: 4.6 Alb: 2.0  Please draw routine labs. Additional labs needed: Draw monthly labs on admission.  Additional notes/follow-up:   Tomasa Blase PA-C 11/02/2022,4:00 PM

## 2022-11-02 NOTE — Discharge Summary (Signed)
Physician Discharge Summary  [name] GNF:[id number] DOB: [birth year] DOA: 10/25/2022  PCP: [name], MD  Admit date: 10/25/2022 Discharge date: 11/02/2022 Admitted From: Homeless Disposition:  Friend's room at the [facility]  Recommendations for Outpatient Follow-up:  Outpatient follow-up with PCP as below Check blood pressure, CMP and CBC at follow-up Please follow up on the following pending results: None  Home Health: None Equipment/Devices: None  Discharge Condition: Stable CODE STATUS: Full code  Follow-up Information     [facility], [facility]. Go on 11/05/2022.   Why: Schedule is Tuesday, Thursday, Saturday with 11:45 am chair time.  On Tuesday, please arrive at 11:00 am to complete paperwork prior to treatment. Contact information: [address] [place] [address] [phone]         [name], MD. Schedule an appointment as soon as possible for a visit in 1 week(s).   Specialty: Internal Medicine Contact information: [address] [place] [address] [phone]                 Hospital course 62 year old F with PMH of ESRD noncompliant with HD (missed HD for over a month), COPD, PAD, GERD, GIB, AVM, anemia, HLD, hep C, tobacco use disorder and homelessness presenting with shortness of breath and chest pain, and admitted for COPD exacerbation and sepsis due to bilateral pneumonia.  Started on steroid, antibiotics and nebulizers.  Urine culture with ESBL Klebsiella.  She was initially on IV cefepime and switched to IV meropenem after urine culture sensitivity.  Hemoglobin was 5.5 on admission.  Hemoccult was positive.  She was transfused 2 units with improvement.  H&H stable.   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  GI recommended advancing diet as  tolerated, daily PPI, monitoring Hgb and iron studies over time.  Hgb 8.1 on the day of discharge.   Patient completed antibiotic course with IV meropenem and vancomycin on 8/16.  Also completed steroid course and remained stable on room air with triple therapy and as needed nebulizers.  She has outpatient chair for HD TTS.  There was concern about safe disposition.  However, she did well with therapy.  TOC offered looking into LTC but patient chose to to go to friend's room at [facility].  Patient's prescriptions filled at [facility] pharmacy and delivered to bedside prior to discharge.    See individual problem list below for more.   Problems addressed during this hospitalization Principal Problem:   Right lower lobe pneumonia Active Problems:   Metabolic acidosis   Chronic hyponatremia   Acute on chronic blood loss anemia   Acute hypoxic respiratory failure (HCC)   ESRD on dialysis-noncompliant with outpatient dialysis due to homeless [facility])   History of arteriovenous malformation (AVM)   COPD with acute exacerbation (HCC)   SIRS (systemic inflammatory response syndrome) (HCC)   Essential hypertension   Hepatitis C   Chronic disease anemia   GERD (gastroesophageal reflux disease)   Chronic diastolic CHF (congestive heart failure) (HCC)   Premature atrial complex due to COPD exacerbation and acute hypoxic respiratory failure   Chronic pain syndrome   Angiodysplasia of upper gastrointestinal tract   Protein-calorie malnutrition, severe   Angiodysplasia of intestine with hemorrhage   Sepsis secondary to community-acquired pneumonia, bilateral pneumonia: Culture data as above. -Completed antibiotic course with vancomycin, cefepime  and meropenem on 8/16.   COPD exacerbation: Still with significant cough.  Smokes about half to 1 pack a day.  Doubt compliance with inhalers. -Completed 5 days of Solu-Medrol, and antibiotics as above. -Renewed her prescription for Trelegy Ellipta and albuterol  inhaler. -Encouraged smoking cessation.   Atypical chest pain/elevated troponin: Hemodynamically stable.  On room air.  She has symmetric pulses. EKG shows A-fib without RVR.  No acute ischemic changes.  Troponin elevated to 90s but flat likely from delayed clearance in the setting of ESRD.  Portable CXR with bibasilar opacity, mild indistinctness of pulmonary vasculature.  Patient is already on broad-spectrum antibiotics and dialysis.  Chest pain improved. -Continue Atarax to 25 mg 3 times daily as needed and BuSpar 10 mg 3 times daily  Acute hypoxic respiratory failure: Resolved.  -See COPD   ESBL UTI: Urine culture with ESBL Klebsiella pneumonia. -Completed antibiotic course with IV meropenem   ESRD/metabolic acidosis/hyponatremia: Reportedly has not had dialysis in over a month.  She is homeless.  Also on p.o. Lasix 80 mg daily. -HD TTS outpatient -Continue p.o. Lasix 80 mg daily   Positive blood culture: Blood culture with staph epidermis with methicillin-resistant in 1 out of 4 bottles likely contaminant.   Acute on chronic blood loss anemia/GI bleed: history of recurrent GI bleed, history of AVM: H&H stable after 2 units. 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  Recent Labs    10/27/22 1340 10/27/22 1738 10/28/22 0032 10/28/22 0741 10/29/22 0110 10/30/22 0804 10/30/22 0904 10/31/22 0831 11/01/22 0051 11/02/22 0328  HGB 8.3* 9.4* 7.8* 8.8* 7.8* 8.0* 8.1* 7.7* 7.1* 8.1*  -Appreciate GI recs-8/15-advance diet as tolerated, daily PPI, monitoring Hgb and iron studies over time. -Aranesp and IV iron per nephrology -Continue Protonix   Anxiety: Reports feeling anxious.  Feels overwhelmed which is understandable given his comorbidity and homelessness.  No imminent danger.  She denies suicidal ideation.  -Gabapentin, BuSpar and Atarax   Tobacco use  disorder: Reports cutting down to half a pack a day recently due to shortness of breath. -Encourage cessation -Nicotine patch   Hypokalemia: Resolved   Hypoglycemia: Likely due to n.p.o. for enteroscopy.  CBG 53.  Not symptomatic.  Resolved   Homelessness: Per TOC, declined looking into LTC.  Likes to go to friend's room at [facility].   Severe malnutrition: POA Nutrition Problem: Severe Malnutrition Etiology: chronic illness (ESRD on HD, COPD) Signs/Symptoms: severe fat depletion, severe muscle depletion, percent weight loss Percent weight loss: 23 % (over 1 year) Interventions: Refer to RD note for recommendations     Time spent 45 minutes  Vital signs Vitals:   11/02/22 0322 11/02/22 0745 11/02/22 0746 11/02/22 0815  BP: (!) 161/84   130/74  Pulse: 93   95  Temp: 97.8 F (36.6 C)   97.9 F (36.6 C)  Resp: 18   20  Height:      Weight: 51.5 kg     SpO2: 93% 94% 94% 94%  TempSrc: Oral   Oral  BMI (Calculated): 17.78        Discharge exam  GENERAL: No apparent distress.  Nontoxic. HEENT: MMM.  Vision and hearing grossly intact.  NECK: Supple.  No apparent JVD.  RESP:  No IWOB.  Fair aeration bilaterally.  Slightly rhonchorous. CVS:  RRR. Heart sounds normal.  ABD/GI/GU: BS+. Abd soft, NTND.  MSK/EXT:  Moves extremities.  No apparent deformity. No edema.  SKIN: no apparent skin lesion or wound NEURO: Awake and alert. Oriented appropriately.  No apparent focal neuro deficit. PSYCH: Calm. Normal affect.   Discharge Instructions Discharge Instructions     Diet - low sodium heart healthy   Complete by: As directed    Discharge instructions   Complete by: As directed    It has been a pleasure taking care of you!  You were hospitalized due to shortness of breath.  You have been treated for pneumonia, COPD, anemia and kidney failure.  Please take your medications as prescribed and go to dialysis as scheduled.  Follow-up with your doctor in 1 to 2 weeks or sooner  if needed.  It is important that you quit smoking cigarettes.  You may use nicotine patch to help you quit smoking.  Nicotine patch is available over-the-counter.  You may also discuss other options to help you quit smoking with your primary care doctor. You can also talk to professional counselors at 1-800-QUIT-NOW 253-532-1489) for free smoking cessation counseling.     Take care,   Increase activity slowly   Complete by: As directed       Allergies as of 11/02/2022       Reactions   Zestril [lisinopril] Other (See Comments)   Hyperkalemia        Medication List     STOP taking these medications    multivitamin with minerals Tabs tablet   predniSONE 10 MG tablet Commonly known as: DELTASONE   sucralfate 1 g tablet Commonly known as: CARAFATE       TAKE these medications    acetaminophen 500 MG tablet Commonly known as: TYLENOL Take 1,000 mg by mouth 2 (two) times daily as needed for moderate pain, fever or headache.   atorvastatin 10 MG tablet Commonly known as: LIPITOR Take 1 tablet (10 mg total) by mouth daily. Start taking on: November 03, 2022   busPIRone 10 MG tablet Commonly known as: BUSPAR Take 1 tablet (10 mg total) by mouth 2 (two) times daily.   FeroSul 325 (65 Fe) MG tablet Generic drug: ferrous sulfate Take 1 tablet (325 mg total) by mouth daily with breakfast.   furosemide 80 MG tablet Commonly known as: Lasix Take 1 tablet (80 mg total) by mouth every Monday, Wednesday, and Friday. Start taking on: November 04, 2022   gabapentin 100 MG capsule Commonly known as: NEURONTIN Take 1 capsule (100 mg total) by mouth 3 (three) times daily.   hydrOXYzine 25 MG tablet Commonly known as: ATARAX Take 1 tablet (25 mg total) by mouth 3 (three) times daily as needed for anxiety or itching.   ipratropium-albuterol 0.5-2.5 (3) MG/3ML Soln Commonly known as: DUONEB Take 3 mLs by nebulization every 6 (six) hours as needed (shortness of breath and  wheezing). What changed: reasons to take this   multivitamin Tabs tablet Take 1 tablet by mouth at bedtime.   oxyCODONE-acetaminophen 5-325 MG tablet Commonly known as: PERCOCET/ROXICET Take 1 tablet by mouth every 8 (eight) hours as needed for severe pain or moderate pain.   pantoprazole 40 MG tablet Commonly known as: PROTONIX Take 1 tablet (40 mg total) by mouth 2 (two) times daily for 30 days, THEN 1 tablet (40 mg total) daily. Start taking on: November 02, 2022 What changed: See the new instructions.   sodium bicarbonate 650 MG tablet Take 1 tablet (650 mg total) by mouth 3 (three) times daily. What changed: how much to take  traZODone 50 MG tablet Commonly known as: DESYREL Take 1 tablet (50 mg total) by mouth at bedtime as needed for sleep.   Trelegy Ellipta 100-62.5-25 MCG/ACT Aepb Generic drug: Fluticasone-Umeclidin-Vilant Inhale 1 puff into the lungs daily.   Ventolin HFA 108 (90 Base) MCG/ACT inhaler Generic drug: albuterol Inhale 2 puffs into the lungs every 6 (six) hours as needed for wheezing or shortness of breath.        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    DG Chest Port 1 View  Result Date: 10/30/2022 CLINICAL DATA:  Chest pain EXAM: PORTABLE CHEST 1 VIEW COMPARISON:  10/25/2022 FINDINGS: The patient is rotated to the right on today's radiograph, reducing diagnostic sensitivity and specificity. Dual lumen right IJ catheter tip: SVC. Indistinct right hemidiaphragm compatible with consolidation. Confluent bandlike opacities at the left lung base. Mild indistinctness of the pulmonary vasculature. Reverse lordotic projection. Heart size believed to be within normal limits. IMPRESSION: 1. Right lower lobe airspace opacity suspicious for pneumonia. 2. Confluent  bandlike opacities at the left lung base, potentially from atelectasis or pneumonia. 3. Mild indistinctness of the pulmonary vasculature, potentially from pulmonary venous congestion. Electronically Signed   By: Gaylyn Rong M.D.   On: 10/30/2022 18:44   CT ABDOMEN PELVIS WO CONTRAST  Result Date: 10/25/2022 CLINICAL DATA:  Shortness of breath and chest pain EXAM: CT ABDOMEN AND PELVIS WITHOUT CONTRAST TECHNIQUE: Multidetector CT imaging of the abdomen and pelvis was performed following the standard protocol without IV contrast. RADIATION DOSE REDUCTION: This exam was performed according to the departmental dose-optimization program which includes automated exposure control, adjustment of the mA and/or kV according to patient size and/or use of iterative reconstruction technique. COMPARISON:  CT abdomen and pelvis dated 12/31/2018 FINDINGS: Lower chest: Left lower lobe consolidation with air bronchograms. No pleural effusion or pneumothorax demonstrated. Partially imaged heart size is normal. Coronary artery calcifications. Hepatobiliary: Subcentimeter segment 3 hypodensities, too small to characterize, but unchanged from 12/31/2018. No intra or extrahepatic biliary ductal dilation. Normal gallbladder. Pancreas: No focal lesions or main ductal dilation. Spleen: Normal in size without focal abnormality. Adrenals/Urinary Tract: No adrenal nodules. 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 No suspicious renal mass, calculi or hydronephrosis. Gas within the urinary bladder, which demonstrates mild mural thickening and pericystic stranding. Stomach/Bowel: Normal appearance of the stomach. No evidence of bowel wall thickening, distention, or inflammatory changes. Normal appendix. Vascular/Lymphatic: Aortic atherosclerosis. No enlarged abdominal or pelvic lymph nodes. Reproductive: No adnexal masses. Other: Trace free fluid.  No fluid collection or free air. Musculoskeletal: No acute or abnormal lytic or  blastic osseous lesions. Multilevel degenerative changes of the partially imaged thoracic and lumbar spine. IMPRESSION: 1. Left lower lobe consolidation with air bronchograms, suspicious for pneumonia. 2. Gas within the urinary bladder, which demonstrates mild mural thickening and pericystic stranding, suspicious for cystitis. Recommend correlation with urinalysis. 3. Aortic Atherosclerosis (ICD10-I70.0). Coronary artery calcifications. Assessment for potential risk factor modification, dietary therapy or pharmacologic therapy may be warranted, if clinically indicated. Electronically Signed   By: Agustin Cree M.D.   On: 10/25/2022 16:22   DG Chest Portable 1 View  Result Date: 10/25/2022 CLINICAL DATA:  Shortness of breath EXAM: PORTABLE CHEST 1 VIEW COMPARISON:  Previous studies including the examination of 08/19/2022 FINDINGS: Transverse diameter of heart is slightly increased. Tip of right IJ dialysis catheter  is seen in the region of superior vena cava. There is interval clearing of pulmonary vascular congestion and pulmonary edema. There is focal patchy infiltrate in right mid and right lower lung fields suggesting pneumonia. Lateral CP angles are clear. There is no pneumothorax. IMPRESSION: There are patchy alveolar infiltrates in right mid and right lower lung fields suggesting pneumonia. There are no signs of alveolar pulmonary edema. There is no pleural effusion or pneumothorax. Electronically Signed   By: [name] Avena M.D.   On: 10/25/2022 15:03       The results of significant diagnostics from this hospitalization (including imaging, microbiology, ancillary and laboratory) are listed below for reference.     Microbiology: Recent Results (from the past 240 hour(s))  Blood culture (routine x 2)     Status: Abnormal   Collection Time: 10/25/22  1:51 PM   Specimen: BLOOD  Result Value Ref Range Status   Specimen Description BLOOD LEFT ANTECUBITAL  Final   Special Requests   Final     BOTTLES DRAWN AEROBIC ONLY Blood Culture results may not be optimal due to an excessive volume of blood received in culture bottles   Culture  Setup Time   Final    GRAM POSITIVE COCCI GRAM POSITIVE RODS AEROBIC BOTTLE ONLY CRITICAL RESULT CALLED TO, READ BACK BY AND VERIFIED WITH: PHARMD MADISON OWEN [id number] 1656 BY J RAZZAK, MT    Culture (A)  Final    STAPHYLOCOCCUS EPIDERMIDIS THE SIGNIFICANCE OF ISOLATING THIS ORGANISM FROM A SINGLE SET OF BLOOD CULTURES WHEN MULTIPLE SETS ARE DRAWN IS UNCERTAIN. PLEASE NOTIFY THE MICROBIOLOGY DEPARTMENT WITHIN ONE WEEK IF SPECIATION AND SENSITIVITIES ARE REQUIRED. CORYNEBACTERIUM, GROUP JK Standardized susceptibility testing for this organism is not available. Performed at [facility], [address] [place], [place] [address]    Report Status 10/29/2022 FINAL  Final  Blood Culture ID Panel (Reflexed)     Status: Abnormal   Collection Time: 10/25/22  1:51 PM  Result Value Ref Range Status   Enterococcus faecalis NOT DETECTED NOT DETECTED Final   Enterococcus Faecium NOT DETECTED NOT DETECTED Final   Listeria monocytogenes NOT DETECTED NOT DETECTED Final   Staphylococcus species DETECTED (A) NOT DETECTED Final    Comment: CRITICAL RESULT CALLED TO, READ BACK BY AND VERIFIED WITH: PHARMD MADISON OWEN [id number] 1656 BY J RAZZAK, MT    Staphylococcus aureus (BCID) NOT DETECTED NOT DETECTED Final   Staphylococcus epidermidis DETECTED (A) NOT DETECTED Final    Comment: Methicillin (oxacillin) resistant coagulase negative staphylococcus. Possible blood culture contaminant (unless isolated from more than one blood culture draw or clinical case suggests pathogenicity). No antibiotic treatment is indicated for blood  culture contaminants. CRITICAL RESULT CALLED TO, READ BACK BY AND VERIFIED WITH: PHARMD MADISON Cornelius Moras [id number] 1656 BY J RAZZAK,MT    Staphylococcus lugdunensis NOT DETECTED NOT DETECTED Final   Streptococcus species NOT DETECTED NOT  DETECTED Final   Streptococcus agalactiae NOT DETECTED NOT DETECTED Final   Streptococcus pneumoniae NOT DETECTED NOT DETECTED Final   Streptococcus pyogenes NOT DETECTED NOT DETECTED Final   A.calcoaceticus-baumannii NOT DETECTED NOT DETECTED Final   Bacteroides fragilis NOT DETECTED NOT DETECTED Final   Enterobacterales NOT DETECTED NOT DETECTED Final   Enterobacter cloacae complex NOT DETECTED NOT DETECTED Final   Escherichia coli NOT DETECTED NOT DETECTED Final   Klebsiella aerogenes NOT DETECTED NOT DETECTED Final   Klebsiella oxytoca NOT DETECTED NOT DETECTED Final   Klebsiella pneumoniae NOT DETECTED NOT DETECTED Final   Proteus  species NOT DETECTED NOT DETECTED Final   Salmonella species NOT DETECTED NOT DETECTED Final   Serratia marcescens NOT DETECTED NOT DETECTED Final   Haemophilus influenzae NOT DETECTED NOT DETECTED Final   Neisseria meningitidis NOT DETECTED NOT DETECTED Final   Pseudomonas aeruginosa NOT DETECTED NOT DETECTED Final   Stenotrophomonas maltophilia NOT DETECTED NOT DETECTED Final   Candida albicans NOT DETECTED NOT DETECTED Final   Candida auris NOT DETECTED NOT DETECTED Final   Candida glabrata NOT DETECTED NOT DETECTED Final   Candida krusei NOT DETECTED NOT DETECTED Final   Candida parapsilosis NOT DETECTED NOT DETECTED Final   Candida tropicalis NOT DETECTED NOT DETECTED Final   Cryptococcus neoformans/gattii NOT DETECTED NOT DETECTED Final   Methicillin resistance mecA/C DETECTED (A) NOT DETECTED Final    Comment: CRITICAL RESULT CALLED TO, READ BACK BY AND VERIFIED WITH: PHARMD MADISON OWEN [id number] 1656 BY Berline Chough, MT Performed at [facility], [address], [place], [place] [address]   Blood culture (routine x 2)     Status: None   Collection Time: 10/25/22  3:03 PM   Specimen: BLOOD LEFT FOREARM  Result Value Ref Range Status   Specimen Description BLOOD LEFT FOREARM  Final   Special Requests   Final    BOTTLES DRAWN AEROBIC AND  ANAEROBIC Blood Culture results may not be optimal due to an excessive volume of blood received in culture bottles   Culture   Final    NO GROWTH 5 DAYS Performed at [facility], [address] [place], [place] [address]    Report Status 10/30/2022 FINAL  Final  Urine Culture     Status: Abnormal   Collection Time: 10/25/22  8:15 PM   Specimen: Urine, Clean Catch  Result Value Ref Range Status   Specimen Description URINE, CLEAN CATCH  Final   Special Requests   Final    NONE Reflexed from [id number] Performed at [facility], [address] [place], [place] [address]    Culture (A)  Final    >=100,000 COLONIES/mL KLEBSIELLA PNEUMONIAE Confirmed Extended Spectrum Beta-Lactamase Producer (ESBL).  In bloodstream infections from ESBL organisms, carbapenems are preferred over piperacillin/tazobactam. They are shown to have a lower risk of mortality.    Report Status 10/27/2022 FINAL  Final   Organism ID, Bacteria KLEBSIELLA PNEUMONIAE (A)  Final      Susceptibility   Klebsiella pneumoniae - MIC*    AMPICILLIN >=32 RESISTANT Resistant     CEFAZOLIN >=64 RESISTANT Resistant     CEFEPIME >=32 RESISTANT Resistant     CEFTRIAXONE >=64 RESISTANT Resistant     CIPROFLOXACIN 0.5 INTERMEDIATE Intermediate     GENTAMICIN >=16 RESISTANT Resistant     IMIPENEM <=0.25 SENSITIVE Sensitive     NITROFURANTOIN 128 RESISTANT Resistant     TRIMETH/SULFA >=320 RESISTANT Resistant     AMPICILLIN/SULBACTAM >=32 RESISTANT Resistant     PIP/TAZO <=4 SENSITIVE Sensitive     * >=100,000 COLONIES/mL KLEBSIELLA PNEUMONIAE  Respiratory (~20 pathogens) panel by PCR     Status: None   Collection Time: 10/25/22  9:07 PM   Specimen: Nasopharyngeal Swab; Respiratory  Result Value Ref Range Status   Adenovirus NOT DETECTED NOT DETECTED Final   Coronavirus 229E NOT DETECTED NOT DETECTED Final    Comment: (NOTE) The Coronavirus on the Respiratory Panel, DOES NOT test for the novel  Coronavirus (2019  nCoV)    Coronavirus HKU1 NOT DETECTED NOT DETECTED Final   Coronavirus NL63  NOT DETECTED NOT DETECTED Final   Coronavirus OC43 NOT DETECTED NOT DETECTED Final   Metapneumovirus NOT DETECTED NOT DETECTED Final   Rhinovirus / Enterovirus NOT DETECTED NOT DETECTED Final   Influenza A NOT DETECTED NOT DETECTED Final   Influenza B NOT DETECTED NOT DETECTED Final   Parainfluenza Virus 1 NOT DETECTED NOT DETECTED Final   Parainfluenza Virus 2 NOT DETECTED NOT DETECTED Final   Parainfluenza Virus 3 NOT DETECTED NOT DETECTED Final   Parainfluenza Virus 4 NOT DETECTED NOT DETECTED Final   Respiratory Syncytial Virus NOT DETECTED NOT DETECTED Final   Bordetella pertussis NOT DETECTED NOT DETECTED Final   Bordetella Parapertussis NOT DETECTED NOT DETECTED Final   Chlamydophila pneumoniae NOT DETECTED NOT DETECTED Final   Mycoplasma pneumoniae NOT DETECTED NOT DETECTED Final    Comment: Performed at [facility], [address], [place], [place] [address]  MRSA Next Gen by PCR, Nasal     Status: Abnormal   Collection Time: 10/26/22  2:58 AM   Specimen: Nasal Mucosa; Nasal Swab  Result Value Ref Range Status   MRSA by PCR Next Gen DETECTED (A) NOT DETECTED Final    Comment: RESULT CALLED TO, READ BACK BY AND VERIFIED WITH: S. [facility] RN 10/26/22 @ 0404 BY AB (NOTE) The GeneXpert MRSA Assay (FDA approved for NASAL specimens only), is one component of a comprehensive MRSA colonization surveillance program. It is not intended to diagnose MRSA infection nor to guide or monitor treatment for MRSA infections. Test performance is not FDA approved in patients less than 41 years old. Performed at [facility], [address], [place], [place] [address]      Labs:  CBC: Recent Labs  Lab 10/29/22 0110 10/30/22 0804 10/30/22 0904 10/31/22 0831 11/01/22 0051 11/02/22 0328  WBC 8.3 7.9  --  7.6 7.1 7.0  HGB 7.8* 8.0* 8.1* 7.7* 7.1* 8.1*  HCT 23.4* 25.0* 25.1* 24.4* 22.4* 25.7*  MCV  92.5 96.2  --  97.2 99.1 98.5  PLT 191 218  --  221 240 303   BMP &GFR Recent Labs  Lab 10/27/22 0032 10/28/22 0032 10/29/22 0110 10/30/22 0312 11/01/22 0051 11/02/22 0328  NA 129* 129* 134* 134* 132* 135  K 3.4* 3.6 3.3* 4.3 5.0 5.0  CL 91* 94* 96* 97* 96* 94*  CO2 19* 19* 26 24 24 29   GLUCOSE 142* 122* 125* 72 111* 86  BUN 42* 70* 35* 56* 55* 28*  CREATININE 2.93* 4.21* 2.44* 3.25* 3.47* 2.43*  CALCIUM 8.0* 7.8* 7.3* 7.4* 8.1* 8.4*  MG 2.0  --  1.6* 1.9 1.8 1.8  PHOS  --   --  2.7 3.1 4.7* 4.6   Estimated Creatinine Clearance: 19.5 mL/min (A) (by C-G formula based on SCr of 2.43 mg/dL (H)). Liver & Pancreas: Recent Labs  Lab 10/27/22 0032 10/28/22 0032 10/29/22 0110 10/30/22 0312 11/01/22 0051 11/02/22 0328  AST 26 33  --   --   --   --   ALT 13 15  --   --   --   --   ALKPHOS 55 48  --   --   --   --   BILITOT 0.4 0.7  --   --   --   --   PROT 5.2* 5.5*  --   --   --   --   ALBUMIN 1.9* 1.9* 1.8* 1.8* 1.8* 2.0*   No results for input(s): "LIPASE", "AMYLASE" in the last 168 hours. No  results for input(s): "AMMONIA" in the last 168 hours. Diabetic: No results for input(s): "HGBA1C" in the last 72 hours. Recent Labs  Lab 11/01/22 1629 11/01/22 2108 11/02/22 0334 11/02/22 0600 11/02/22 1135  GLUCAP 83 99 78 78 100*   Cardiac Enzymes: No results for input(s): "CKTOTAL", "CKMB", "CKMBINDEX", "TROPONINI" in the last 168 hours. No results for input(s): "PROBNP" in the last 8760 hours. Coagulation Profile: No results for input(s): "INR", "PROTIME" in the last 168 hours. Thyroid Function Tests: No results for input(s): "TSH", "T4TOTAL", "FREET4", "T3FREE", "THYROIDAB" in the last 72 hours. Lipid Profile: No results for input(s): "CHOL", "HDL", "LDLCALC", "TRIG", "CHOLHDL", "LDLDIRECT" in the last 72 hours. Anemia Panel: No results for input(s): "VITAMINB12", "FOLATE", "FERRITIN", "TIBC", "IRON", "RETICCTPCT" in the last 72 hours. Urine analysis:    Component  Value Date/Time   COLORURINE YELLOW 10/25/2022 2015   APPEARANCEUR HAZY (A) 10/25/2022 2015   LABSPEC 1.009 10/25/2022 2015   PHURINE 5.0 10/25/2022 2015   GLUCOSEU 50 (A) 10/25/2022 2015   HGBUR LARGE (A) 10/25/2022 2015   BILIRUBINUR NEGATIVE 10/25/2022 2015   KETONESUR NEGATIVE 10/25/2022 2015   PROTEINUR 100 (A) 10/25/2022 2015   UROBILINOGEN 0.2 03/02/2010 2027   NITRITE POSITIVE (A) 10/25/2022 2015   LEUKOCYTESUR MODERATE (A) 10/25/2022 2015   Sepsis Labs: Invalid input(s): "PROCALCITONIN", "LACTICIDVEN"   SIGNED:  Almon Hercules, MD  [facility] 11/02/2022, 4:16 PM

## 2022-11-03 ENCOUNTER — Telehealth: Payer: Self-pay | Admitting: Nephrology

## 2022-11-03 NOTE — Telephone Encounter (Signed)
Transition of care contact from inpatient facility  Date of Discharge:  11/02/22 Date of Contact: 11/03/22 Method of contact: Phone  Attempted to contact patient to discuss transition of care from inpatient admission. Patient did not answer the phone.

## 2022-11-04 ENCOUNTER — Encounter (HOSPITAL_COMMUNITY): Payer: Self-pay | Admitting: Internal Medicine

## 2022-11-04 ENCOUNTER — Other Ambulatory Visit: Payer: Self-pay

## 2022-11-04 ENCOUNTER — Telehealth (HOSPITAL_COMMUNITY): Payer: Self-pay | Admitting: Nephrology

## 2022-11-04 DIAGNOSIS — N186 End stage renal disease: Secondary | ICD-10-CM

## 2022-11-04 NOTE — Progress Notes (Signed)
Late Note Entry- November 04, 2022  Pt was d/c on Saturday. Contacted [facility] this morning to advise clinic of pt's d/c date and that pt should start tomorrow as planned.   Olivia Canter Renal Navigator [phone]

## 2022-11-04 NOTE — Telephone Encounter (Signed)
Transition of care contact from inpatient facility  Date of Discharge: 11/02/22 Date of Contact: 11/04/22 - attempted Method of contact: Phone  Attempted to contact patient to discuss transition of care from inpatient admission. Patient did not answer the phone. Message was left on the patient's voicemail with call back number [phone].  Ozzie Hoyle, PA-C [employer] Pager [phone]

## 2022-11-05 ENCOUNTER — Telehealth: Payer: Self-pay

## 2022-11-05 NOTE — Transitions of Care (Post Inpatient/ED Visit) (Signed)
   11/05/2022  Name: MEGGEN ANKENBAUER MRN: [id number] DOB: [birth year]  Today's TOC FU Call Status: Today's TOC FU Call Status:: Unsuccessful Call (1st Attempt) Unsuccessful Call (1st Attempt) Date: 11/05/22  Attempted to reach the patient regarding the most recent Inpatient/ED visit.  Follow Up Plan: Additional outreach attempts will be made to reach the patient to complete the Transitions of Care (Post Inpatient/ED visit) call.   Signature Robyne Peers, RN

## 2022-11-06 ENCOUNTER — Telehealth: Payer: Self-pay

## 2022-11-06 NOTE — Transitions of Care (Post Inpatient/ED Visit) (Signed)
   11/06/2022  Name: [name] MRN: [id number] DOB: Dec 24, 1960  Today's TOC FU Call Status: Today's TOC FU Call Status:: Unsuccessful Call (2nd Attempt) Unsuccessful Call (1st Attempt) Date: 11/05/22 Unsuccessful Call (2nd Attempt) Date: 11/06/22  Attempted to reach the patient regarding the most recent Inpatient/ED visit.  Follow Up Plan: Additional outreach attempts will be made to reach the patient to complete the Transitions of Care (Post Inpatient/ED visit) call.   Signature Robyne Peers, RN

## 2022-11-07 ENCOUNTER — Telehealth: Payer: Self-pay

## 2022-11-07 NOTE — Transitions of Care (Post Inpatient/ED Visit) (Signed)
   11/07/2022  Name: [name] MRN: [id number] DOB: 1961/02/18  Today's TOC FU Call Status: Today's TOC FU Call Status:: Unsuccessful Call (3rd Attempt) Unsuccessful Call (1st Attempt) Date: 11/05/22 Unsuccessful Call (2nd Attempt) Date: 11/06/22 Unsuccessful Call (3rd Attempt) Date: 11/07/22  Attempted to reach the patient regarding the most recent Inpatient/ED visit.  Follow Up Plan: No further outreach attempts will be made at this time. We have been unable to contact the patient.  She has an appointment with Dr Laural Benes at [facility] on 11/29/2022.   Letter also sent to patient informing her that we have been trying to reach her without success  Signature Robyne Peers, RN

## 2022-11-09 ENCOUNTER — Emergency Department (HOSPITAL_COMMUNITY): Payer: Medicare Other

## 2022-11-09 ENCOUNTER — Inpatient Hospital Stay (HOSPITAL_COMMUNITY)
Admission: EM | Admit: 2022-11-09 | Discharge: 2022-11-24 | DRG: 981 | Disposition: A | Discharge status: Home / Self Care | Payer: Medicare Other | Attending: Family Medicine | Admitting: Family Medicine

## 2022-11-09 ENCOUNTER — Other Ambulatory Visit: Payer: Self-pay

## 2022-11-09 DIAGNOSIS — E8770 Fluid overload, unspecified: Secondary | ICD-10-CM | POA: Diagnosis not present

## 2022-11-09 DIAGNOSIS — I5033 Acute on chronic diastolic (congestive) heart failure: Secondary | ICD-10-CM | POA: Diagnosis present

## 2022-11-09 DIAGNOSIS — D509 Iron deficiency anemia, unspecified: Secondary | ICD-10-CM | POA: Diagnosis present

## 2022-11-09 DIAGNOSIS — E872 Acidosis, unspecified: Secondary | ICD-10-CM | POA: Diagnosis present

## 2022-11-09 DIAGNOSIS — E871 Hypo-osmolality and hyponatremia: Secondary | ICD-10-CM | POA: Diagnosis present

## 2022-11-09 DIAGNOSIS — E1151 Type 2 diabetes mellitus with diabetic peripheral angiopathy without gangrene: Secondary | ICD-10-CM | POA: Diagnosis present

## 2022-11-09 DIAGNOSIS — Z91158 Patient's noncompliance with renal dialysis for other reason: Secondary | ICD-10-CM

## 2022-11-09 DIAGNOSIS — L89322 Pressure ulcer of left buttock, stage 2: Secondary | ICD-10-CM | POA: Diagnosis present

## 2022-11-09 DIAGNOSIS — Z681 Body mass index (BMI) 19 or less, adult: Secondary | ICD-10-CM

## 2022-11-09 DIAGNOSIS — L03114 Cellulitis of left upper limb: Secondary | ICD-10-CM | POA: Diagnosis present

## 2022-11-09 DIAGNOSIS — F17210 Nicotine dependence, cigarettes, uncomplicated: Secondary | ICD-10-CM | POA: Diagnosis present

## 2022-11-09 DIAGNOSIS — I10 Essential (primary) hypertension: Secondary | ICD-10-CM | POA: Diagnosis present

## 2022-11-09 DIAGNOSIS — Z5902 Unsheltered homelessness: Secondary | ICD-10-CM

## 2022-11-09 DIAGNOSIS — E785 Hyperlipidemia, unspecified: Secondary | ICD-10-CM | POA: Diagnosis present

## 2022-11-09 DIAGNOSIS — I132 Hypertensive heart and chronic kidney disease with heart failure and with stage 5 chronic kidney disease, or end stage renal disease: Secondary | ICD-10-CM | POA: Diagnosis present

## 2022-11-09 DIAGNOSIS — D631 Anemia in chronic kidney disease: Secondary | ICD-10-CM | POA: Diagnosis present

## 2022-11-09 DIAGNOSIS — E1140 Type 2 diabetes mellitus with diabetic neuropathy, unspecified: Secondary | ICD-10-CM | POA: Diagnosis present

## 2022-11-09 DIAGNOSIS — Z992 Dependence on renal dialysis: Secondary | ICD-10-CM

## 2022-11-09 DIAGNOSIS — E43 Unspecified severe protein-calorie malnutrition: Secondary | ICD-10-CM | POA: Diagnosis present

## 2022-11-09 DIAGNOSIS — J4489 Other specified chronic obstructive pulmonary disease: Secondary | ICD-10-CM | POA: Diagnosis present

## 2022-11-09 DIAGNOSIS — Z79899 Other long term (current) drug therapy: Secondary | ICD-10-CM

## 2022-11-09 DIAGNOSIS — N186 End stage renal disease: Secondary | ICD-10-CM | POA: Diagnosis present

## 2022-11-09 DIAGNOSIS — F32A Depression, unspecified: Secondary | ICD-10-CM | POA: Diagnosis present

## 2022-11-09 DIAGNOSIS — L89152 Pressure ulcer of sacral region, stage 2: Secondary | ICD-10-CM | POA: Diagnosis present

## 2022-11-09 DIAGNOSIS — I808 Phlebitis and thrombophlebitis of other sites: Secondary | ICD-10-CM | POA: Diagnosis present

## 2022-11-09 DIAGNOSIS — Z23 Encounter for immunization: Secondary | ICD-10-CM

## 2022-11-09 DIAGNOSIS — L8990 Pressure ulcer of unspecified site, unspecified stage: Secondary | ICD-10-CM | POA: Diagnosis present

## 2022-11-09 DIAGNOSIS — N2581 Secondary hyperparathyroidism of renal origin: Secondary | ICD-10-CM | POA: Diagnosis present

## 2022-11-09 DIAGNOSIS — E8809 Other disorders of plasma-protein metabolism, not elsewhere classified: Secondary | ICD-10-CM | POA: Diagnosis present

## 2022-11-09 DIAGNOSIS — F411 Generalized anxiety disorder: Secondary | ICD-10-CM | POA: Diagnosis present

## 2022-11-09 DIAGNOSIS — Z8709 Personal history of other diseases of the respiratory system: Secondary | ICD-10-CM

## 2022-11-09 DIAGNOSIS — Z833 Family history of diabetes mellitus: Secondary | ICD-10-CM

## 2022-11-09 DIAGNOSIS — E1122 Type 2 diabetes mellitus with diabetic chronic kidney disease: Secondary | ICD-10-CM | POA: Diagnosis present

## 2022-11-09 DIAGNOSIS — M898X9 Other specified disorders of bone, unspecified site: Secondary | ICD-10-CM | POA: Diagnosis present

## 2022-11-09 DIAGNOSIS — K219 Gastro-esophageal reflux disease without esophagitis: Secondary | ICD-10-CM | POA: Diagnosis present

## 2022-11-09 DIAGNOSIS — Z7951 Long term (current) use of inhaled steroids: Secondary | ICD-10-CM

## 2022-11-09 DIAGNOSIS — Z91199 Patient's noncompliance with other medical treatment and regimen due to unspecified reason: Secondary | ICD-10-CM

## 2022-11-09 LAB — CBC WITH DIFFERENTIAL/PLATELET
Abs Immature Granulocytes: 0.12 10*3/uL — ABNORMAL HIGH (ref 0.00–0.07)
Basophils Absolute: 0.1 10*3/uL (ref 0.0–0.1)
Basophils Relative: 1 %
Eosinophils Absolute: 0.2 10*3/uL (ref 0.0–0.5)
Eosinophils Relative: 2 %
HCT: 22.5 % — ABNORMAL LOW (ref 36.0–46.0)
Hemoglobin: 7.1 g/dL — ABNORMAL LOW (ref 12.0–15.0)
Immature Granulocytes: 2 %
Lymphocytes Relative: 22 %
Lymphs Abs: 1.8 10*3/uL (ref 0.7–4.0)
MCH: 31 pg (ref 26.0–34.0)
MCHC: 31.6 g/dL (ref 30.0–36.0)
MCV: 98.3 fL (ref 80.0–100.0)
Monocytes Absolute: 0.6 10*3/uL (ref 0.1–1.0)
Monocytes Relative: 7 %
Neutro Abs: 5.5 10*3/uL (ref 1.7–7.7)
Neutrophils Relative %: 66 %
Platelets: 362 10*3/uL (ref 150–400)
RBC: 2.29 MIL/uL — ABNORMAL LOW (ref 3.87–5.11)
RDW: 17.5 % — ABNORMAL HIGH (ref 11.5–15.5)
WBC: 8.2 10*3/uL (ref 4.0–10.5)
nRBC: 0 % (ref 0.0–0.2)

## 2022-11-09 LAB — COMPREHENSIVE METABOLIC PANEL
ALT: 25 U/L (ref 0–44)
AST: 44 U/L — ABNORMAL HIGH (ref 15–41)
Albumin: 2.6 g/dL — ABNORMAL LOW (ref 3.5–5.0)
Alkaline Phosphatase: 97 U/L (ref 38–126)
Anion gap: 20 — ABNORMAL HIGH (ref 5–15)
BUN: 59 mg/dL — ABNORMAL HIGH (ref 8–23)
CO2: 16 mmol/L — ABNORMAL LOW (ref 22–32)
Calcium: 7.5 mg/dL — ABNORMAL LOW (ref 8.9–10.3)
Chloride: 99 mmol/L (ref 98–111)
Creatinine, Ser: 5.2 mg/dL — ABNORMAL HIGH (ref 0.44–1.00)
GFR, Estimated: 9 mL/min — ABNORMAL LOW (ref >60)
Glucose, Bld: 84 mg/dL (ref 70–99)
Potassium: 3.9 mmol/L (ref 3.5–5.1)
Sodium: 135 mmol/L (ref 135–145)
Total Bilirubin: 0.5 mg/dL (ref 0.3–1.2)
Total Protein: 6.6 g/dL (ref 6.5–8.1)

## 2022-11-09 LAB — TROPONIN I (HIGH SENSITIVITY): Troponin I (High Sensitivity): 23 ng/L — ABNORMAL HIGH (ref <18)

## 2022-11-09 LAB — MAGNESIUM: Magnesium: 1.9 mg/dL (ref 1.7–2.4)

## 2022-11-09 MED ORDER — GABAPENTIN 100 MG PO CAPS
100.0000 mg | ORAL_CAPSULE | Freq: Three times a day (TID) | ORAL | Status: DC
  Administered 2022-11-09 – 2022-11-24 (×44): 100 mg via ORAL
  Filled 2022-11-09 (×44): qty 1

## 2022-11-09 MED ORDER — OXYCODONE HCL 5 MG PO TABS
5.0000 mg | ORAL_TABLET | Freq: Once | ORAL | Status: AC
  Administered 2022-11-09: 5 mg via ORAL
  Filled 2022-11-09: qty 1

## 2022-11-09 NOTE — ED Triage Notes (Signed)
[place] [facility] [place] of lower leg swelling and redness. Pt was d/c from inpatient last Saturday and has not had dialysis since then. Pt has been homeless and unable to go to dialysis. Pt has wet cough as well.

## 2022-11-09 NOTE — ED Provider Notes (Signed)
[place] EMERGENCY DEPARTMENT AT [facility] Provider Note   CSN: [id number] Arrival date & time: 11/09/22  2111     History {Add pertinent medical, surgical, social history, OB history to HPI:1} Chief Complaint  Patient presents with   Leg Swelling   Cough    [name] is a 62 y.o. female.  62 y/o female with hx of ESRD (noncompliant; last HD ~1 week ago), COPD, PAD, GERD, GIB, AVM, anemia, HLD, hep C, tobacco use disorder and homelessness presents to the emergency department complaining of pain to her bilateral lower extremities.  She states that she has not been to dialysis in over a week and, when she retains fluid, it collects in her legs and causes her neuropathy pain to worsen.  Continues to void; twice today.  She states that she has tried to be compliant with her prescribed medications, but the labels got wet and she can no longer read the instructions. She has chosen to take 1 tablet from each bottle in the morning. Has not been using her inhalers. No fevers.  Has been homeless for the past 2 days because she didn't have money to pay her friend for the hotel room. Declined going to LTC when inpatient because she didn't want to "give up my check".   Cough      Home Medications Prior to Admission medications   Medication Sig Start Date End Date Taking? Authorizing Provider  acetaminophen (TYLENOL) 500 MG tablet Take 1,000 mg by mouth 2 (two) times daily as needed for moderate pain, fever or headache.    [provider]  albuterol (VENTOLIN HFA) 108 (90 Base) MCG/ACT inhaler Inhale 2 puffs into the lungs every 6 (six) hours as needed for wheezing or shortness of breath. 11/02/22   Almon Hercules, MD  atorvastatin (LIPITOR) 10 MG tablet Take 1 tablet (10 mg total) by mouth daily. 11/03/22   Almon Hercules, MD  busPIRone (BUSPAR) 10 MG tablet Take 1 tablet (10 mg total) by mouth 2 (two) times daily. 11/02/22   Almon Hercules, MD  ferrous sulfate 325 (65 FE)  MG tablet Take 1 tablet (325 mg total) by mouth daily with breakfast. 11/02/22   Almon Hercules, MD  Fluticasone-Umeclidin-Vilant (TRELEGY ELLIPTA) 100-62.5-25 MCG/ACT AEPB Inhale 1 puff into the lungs daily. 11/02/22   Almon Hercules, MD  furosemide (LASIX) 80 MG tablet Take 1 tablet (80 mg total) by mouth every Monday, Wednesday, and Friday. 11/04/22 11/04/23  Almon Hercules, MD  gabapentin (NEURONTIN) 100 MG capsule Take 1 capsule (100 mg total) by mouth 3 (three) times daily. 11/02/22   Almon Hercules, MD  hydrOXYzine (ATARAX) 25 MG tablet Take 1 tablet (25 mg total) by mouth 3 (three) times daily as needed for anxiety or itching. 11/02/22   Almon Hercules, MD  ipratropium-albuterol (DUONEB) 0.5-2.5 (3) MG/3ML SOLN Take 3 mLs by nebulization every 6 (six) hours as needed (shortness of breath and wheezing). Patient taking differently: Take 3 mLs by nebulization every 6 (six) hours as needed (shortness of breath, wheezing). 01/21/22   Claiborne Rigg, NP  multivitamin (RENA-VIT) TABS tablet Take 1 tablet by mouth at bedtime. 11/02/22   Almon Hercules, MD  oxyCODONE-acetaminophen (PERCOCET/ROXICET) 5-325 MG tablet Take 1 tablet by mouth every 8 (eight) hours as needed for severe pain or moderate pain. 08/05/22   Lorin Glass, MD  pantoprazole (PROTONIX) 40 MG tablet Take 1 tablet (40 mg total) by mouth 2 (two)  times daily for 30 days, THEN 1 tablet (40 mg total) daily. 11/02/22 01/31/23  Almon Hercules, MD  sodium bicarbonate 650 MG tablet Take 1 tablet (650 mg total) by mouth 3 (three) times daily. 11/02/22   Almon Hercules, MD  traZODone (DESYREL) 50 MG tablet Take 1 tablet (50 mg total) by mouth at bedtime as needed for sleep. 11/02/22   Almon Hercules, MD      Allergies    Zestril [lisinopril]    Review of Systems   Review of Systems  Respiratory:  Positive for cough.   Ten systems reviewed and are negative for acute change, except as noted in the HPI.    Physical Exam Updated Vital Signs BP (!) 175/80    Pulse 91   Temp 98 F (36.7 C)   Resp 18   Ht 5\' 7"  (1.702 m)   SpO2 100%   BMI 17.78 kg/m   Physical Exam Vitals and nursing note reviewed.  Constitutional:      General: She is not in acute distress.    Appearance: She is well-developed. She is not diaphoretic.     Comments: Thin/frail appearing. Chronically ill, but nontoxic.  HENT:     Head: Normocephalic and atraumatic.  Eyes:     General: No scleral icterus.    Extraocular Movements: EOM normal.     Conjunctiva/sclera: Conjunctivae normal.  Cardiovascular:     Rate and Rhythm: Normal rate and regular rhythm.     Pulses: Normal pulses.  Pulmonary:     Effort: Pulmonary effort is normal. No respiratory distress.     Breath sounds: Rhonchi (mild expiratory) present. No wheezing.     Comments: Respirations even and unlabored. Congested cough noted sporadically. No hypoxia. Abdominal:     General: There is no distension.     Palpations: Abdomen is soft.  Musculoskeletal:        General: Normal range of motion.     Cervical back: Normal range of motion.     Comments: Pitting edema of BLE  Skin:    General: Skin is warm and dry.     Coloration: Skin is not pale.     Findings: No erythema or rash.  Neurological:     Mental Status: She is alert and oriented to person, place, and time.     Coordination: Coordination normal.  Psychiatric:        Mood and Affect: Mood and affect normal. Affect is tearful.        Behavior: Behavior is agitated.     ED Results / Procedures / Treatments   Labs (all labs ordered are listed, but only abnormal results are displayed) Labs Reviewed - No data to display  EKG None  Radiology No results found.  Procedures Procedures  {Document cardiac monitor, telemetry assessment procedure when appropriate:1}  Medications Ordered in ED Medications - No data to display  ED Course/ Medical Decision Making/ A&P   {   Click here for ABCD2, HEART and other calculatorsREFRESH Note  before signing :1}                              Medical Decision Making  ***  {Document critical care time when appropriate:1} {Document review of labs and clinical decision tools ie heart score, Chads2Vasc2 etc:1}  {Document your independent review of radiology images, and any outside records:1} {Document your discussion with family members, caretakers, and with consultants:1} {Document social  determinants of health affecting pt's care:1} {Document your decision making why or why not admission, treatments were needed:1} Final Clinical Impression(s) / ED Diagnoses Final diagnoses:  None    Rx / DC Orders ED Discharge Orders     None

## 2022-11-10 ENCOUNTER — Encounter (HOSPITAL_COMMUNITY): Payer: Self-pay | Admitting: Internal Medicine

## 2022-11-10 DIAGNOSIS — Z5902 Unsheltered homelessness: Secondary | ICD-10-CM | POA: Diagnosis not present

## 2022-11-10 DIAGNOSIS — Z8709 Personal history of other diseases of the respiratory system: Secondary | ICD-10-CM | POA: Diagnosis not present

## 2022-11-10 DIAGNOSIS — I5033 Acute on chronic diastolic (congestive) heart failure: Secondary | ICD-10-CM

## 2022-11-10 DIAGNOSIS — E8779 Other fluid overload: Secondary | ICD-10-CM

## 2022-11-10 DIAGNOSIS — Z862 Personal history of diseases of the blood and blood-forming organs and certain disorders involving the immune mechanism: Secondary | ICD-10-CM

## 2022-11-10 DIAGNOSIS — E8809 Other disorders of plasma-protein metabolism, not elsewhere classified: Secondary | ICD-10-CM | POA: Diagnosis present

## 2022-11-10 DIAGNOSIS — L03114 Cellulitis of left upper limb: Secondary | ICD-10-CM | POA: Diagnosis present

## 2022-11-10 DIAGNOSIS — F17210 Nicotine dependence, cigarettes, uncomplicated: Secondary | ICD-10-CM | POA: Diagnosis present

## 2022-11-10 DIAGNOSIS — Z681 Body mass index (BMI) 19 or less, adult: Secondary | ICD-10-CM | POA: Diagnosis not present

## 2022-11-10 DIAGNOSIS — E871 Hypo-osmolality and hyponatremia: Secondary | ICD-10-CM | POA: Diagnosis present

## 2022-11-10 DIAGNOSIS — L89312 Pressure ulcer of right buttock, stage 2: Secondary | ICD-10-CM

## 2022-11-10 DIAGNOSIS — I5032 Chronic diastolic (congestive) heart failure: Secondary | ICD-10-CM | POA: Diagnosis not present

## 2022-11-10 DIAGNOSIS — I10 Essential (primary) hypertension: Secondary | ICD-10-CM | POA: Diagnosis not present

## 2022-11-10 DIAGNOSIS — E785 Hyperlipidemia, unspecified: Secondary | ICD-10-CM | POA: Diagnosis present

## 2022-11-10 DIAGNOSIS — D631 Anemia in chronic kidney disease: Secondary | ICD-10-CM | POA: Diagnosis present

## 2022-11-10 DIAGNOSIS — L8990 Pressure ulcer of unspecified site, unspecified stage: Secondary | ICD-10-CM | POA: Diagnosis present

## 2022-11-10 DIAGNOSIS — E8729 Other acidosis: Secondary | ICD-10-CM

## 2022-11-10 DIAGNOSIS — E1122 Type 2 diabetes mellitus with diabetic chronic kidney disease: Secondary | ICD-10-CM | POA: Diagnosis present

## 2022-11-10 DIAGNOSIS — N185 Chronic kidney disease, stage 5: Secondary | ICD-10-CM | POA: Diagnosis not present

## 2022-11-10 DIAGNOSIS — L89322 Pressure ulcer of left buttock, stage 2: Secondary | ICD-10-CM | POA: Diagnosis present

## 2022-11-10 DIAGNOSIS — N189 Chronic kidney disease, unspecified: Secondary | ICD-10-CM

## 2022-11-10 DIAGNOSIS — Z992 Dependence on renal dialysis: Secondary | ICD-10-CM

## 2022-11-10 DIAGNOSIS — N186 End stage renal disease: Secondary | ICD-10-CM | POA: Diagnosis present

## 2022-11-10 DIAGNOSIS — E782 Mixed hyperlipidemia: Secondary | ICD-10-CM | POA: Diagnosis not present

## 2022-11-10 DIAGNOSIS — E1140 Type 2 diabetes mellitus with diabetic neuropathy, unspecified: Secondary | ICD-10-CM | POA: Diagnosis present

## 2022-11-10 DIAGNOSIS — J4489 Other specified chronic obstructive pulmonary disease: Secondary | ICD-10-CM | POA: Diagnosis present

## 2022-11-10 DIAGNOSIS — E43 Unspecified severe protein-calorie malnutrition: Secondary | ICD-10-CM | POA: Diagnosis present

## 2022-11-10 DIAGNOSIS — E8770 Fluid overload, unspecified: Secondary | ICD-10-CM | POA: Diagnosis present

## 2022-11-10 DIAGNOSIS — N2581 Secondary hyperparathyroidism of renal origin: Secondary | ICD-10-CM | POA: Diagnosis present

## 2022-11-10 DIAGNOSIS — F411 Generalized anxiety disorder: Secondary | ICD-10-CM

## 2022-11-10 DIAGNOSIS — E872 Acidosis, unspecified: Secondary | ICD-10-CM | POA: Diagnosis present

## 2022-11-10 DIAGNOSIS — Z23 Encounter for immunization: Secondary | ICD-10-CM | POA: Diagnosis not present

## 2022-11-10 DIAGNOSIS — L89152 Pressure ulcer of sacral region, stage 2: Secondary | ICD-10-CM | POA: Diagnosis present

## 2022-11-10 DIAGNOSIS — I132 Hypertensive heart and chronic kidney disease with heart failure and with stage 5 chronic kidney disease, or end stage renal disease: Secondary | ICD-10-CM | POA: Diagnosis present

## 2022-11-10 DIAGNOSIS — F32A Depression, unspecified: Secondary | ICD-10-CM | POA: Diagnosis present

## 2022-11-10 DIAGNOSIS — E1151 Type 2 diabetes mellitus with diabetic peripheral angiopathy without gangrene: Secondary | ICD-10-CM | POA: Diagnosis present

## 2022-11-10 LAB — COMPREHENSIVE METABOLIC PANEL
ALT: 21 U/L (ref 0–44)
AST: 28 U/L (ref 15–41)
Albumin: 2.2 g/dL — ABNORMAL LOW (ref 3.5–5.0)
Alkaline Phosphatase: 87 U/L (ref 38–126)
Anion gap: 12 (ref 5–15)
BUN: 56 mg/dL — ABNORMAL HIGH (ref 8–23)
CO2: 18 mmol/L — ABNORMAL LOW (ref 22–32)
Calcium: 7 mg/dL — ABNORMAL LOW (ref 8.9–10.3)
Chloride: 102 mmol/L (ref 98–111)
Creatinine, Ser: 5.24 mg/dL — ABNORMAL HIGH (ref 0.44–1.00)
GFR, Estimated: 9 mL/min — ABNORMAL LOW (ref >60)
Glucose, Bld: 74 mg/dL (ref 70–99)
Potassium: 3.5 mmol/L (ref 3.5–5.1)
Sodium: 132 mmol/L — ABNORMAL LOW (ref 135–145)
Total Bilirubin: 0.6 mg/dL (ref 0.3–1.2)
Total Protein: 5.9 g/dL — ABNORMAL LOW (ref 6.5–8.1)

## 2022-11-10 LAB — PROTIME-INR
INR: 1.2 (ref 0.8–1.2)
Prothrombin Time: 15 seconds (ref 11.4–15.2)

## 2022-11-10 LAB — TROPONIN I (HIGH SENSITIVITY): Troponin I (High Sensitivity): 16 ng/L (ref <18)

## 2022-11-10 LAB — CBC WITH DIFFERENTIAL/PLATELET
Abs Immature Granulocytes: 0.02 10*3/uL (ref 0.00–0.07)
Basophils Absolute: 0.1 10*3/uL (ref 0.0–0.1)
Basophils Relative: 1 %
Eosinophils Absolute: 0.2 10*3/uL (ref 0.0–0.5)
Eosinophils Relative: 2 %
HCT: 19.3 % — ABNORMAL LOW (ref 36.0–46.0)
Hemoglobin: 6.3 g/dL — CL (ref 12.0–15.0)
Immature Granulocytes: 0 %
Lymphocytes Relative: 27 %
Lymphs Abs: 1.7 10*3/uL (ref 0.7–4.0)
MCH: 31.8 pg (ref 26.0–34.0)
MCHC: 32.6 g/dL (ref 30.0–36.0)
MCV: 97.5 fL (ref 80.0–100.0)
Monocytes Absolute: 0.5 10*3/uL (ref 0.1–1.0)
Monocytes Relative: 9 %
Neutro Abs: 3.8 10*3/uL (ref 1.7–7.7)
Neutrophils Relative %: 61 %
Platelets: 332 10*3/uL (ref 150–400)
RBC: 1.98 MIL/uL — ABNORMAL LOW (ref 3.87–5.11)
RDW: 17.4 % — ABNORMAL HIGH (ref 11.5–15.5)
WBC: 6.4 10*3/uL (ref 4.0–10.5)
nRBC: 0 % (ref 0.0–0.2)

## 2022-11-10 LAB — HEMOGLOBIN AND HEMATOCRIT, BLOOD
HCT: 22.4 % — ABNORMAL LOW (ref 36.0–46.0)
Hemoglobin: 7.3 g/dL — ABNORMAL LOW (ref 12.0–15.0)

## 2022-11-10 LAB — MAGNESIUM: Magnesium: 1.8 mg/dL (ref 1.7–2.4)

## 2022-11-10 LAB — PREPARE RBC (CROSSMATCH)

## 2022-11-10 LAB — GLUCOSE, CAPILLARY: Glucose-Capillary: 106 mg/dL — ABNORMAL HIGH (ref 70–99)

## 2022-11-10 LAB — BRAIN NATRIURETIC PEPTIDE: B Natriuretic Peptide: 383.3 pg/mL — ABNORMAL HIGH (ref 0.0–100.0)

## 2022-11-10 LAB — PHOSPHORUS: Phosphorus: 8.4 mg/dL — ABNORMAL HIGH (ref 2.5–4.6)

## 2022-11-10 MED ORDER — UMECLIDINIUM BROMIDE 62.5 MCG/ACT IN AEPB
1.0000 | INHALATION_SPRAY | Freq: Every day | RESPIRATORY_TRACT | Status: DC
  Administered 2022-11-11 – 2022-11-24 (×13): 1 via RESPIRATORY_TRACT
  Filled 2022-11-10 (×2): qty 7

## 2022-11-10 MED ORDER — HEPARIN SODIUM (PORCINE) 5000 UNIT/ML IJ SOLN
5000.0000 [IU] | Freq: Three times a day (TID) | INTRAMUSCULAR | Status: DC
  Administered 2022-11-10 – 2022-11-24 (×27): 5000 [IU] via SUBCUTANEOUS
  Filled 2022-11-10 (×39): qty 1

## 2022-11-10 MED ORDER — ACETAMINOPHEN 650 MG RE SUPP: 650.0000 mg | Freq: Four times a day (QID) | RECTAL | Status: DC | PRN

## 2022-11-10 MED ORDER — CALCIUM ACETATE (PHOS BINDER) 667 MG PO CAPS
667.0000 mg | ORAL_CAPSULE | Freq: Three times a day (TID) | ORAL | Status: DC
  Administered 2022-11-11 – 2022-11-24 (×36): 667 mg via ORAL
  Filled 2022-11-10 (×37): qty 1

## 2022-11-10 MED ORDER — HYDROXYZINE HCL 25 MG PO TABS
25.0000 mg | ORAL_TABLET | Freq: Three times a day (TID) | ORAL | Status: DC | PRN
  Administered 2022-11-10 – 2022-11-22 (×10): 25 mg via ORAL
  Filled 2022-11-10 (×9): qty 1

## 2022-11-10 MED ORDER — OXYCODONE-ACETAMINOPHEN 5-325 MG PO TABS
1.0000 | ORAL_TABLET | Freq: Three times a day (TID) | ORAL | Status: DC | PRN
  Administered 2022-11-10: 1 via ORAL
  Filled 2022-11-10: qty 1

## 2022-11-10 MED ORDER — ONDANSETRON HCL 4 MG/2ML IJ SOLN: 4.0000 mg | Freq: Four times a day (QID) | INTRAMUSCULAR | Status: DC | PRN

## 2022-11-10 MED ORDER — SODIUM CHLORIDE 0.9% IV SOLUTION: Freq: Once | INTRAVENOUS | Status: AC

## 2022-11-10 MED ORDER — OXYCODONE-ACETAMINOPHEN 5-325 MG PO TABS
1.0000 | ORAL_TABLET | ORAL | Status: DC | PRN
  Administered 2022-11-10 – 2022-11-24 (×45): 1 via ORAL
  Filled 2022-11-10 (×46): qty 1

## 2022-11-10 MED ORDER — ALBUTEROL SULFATE (2.5 MG/3ML) 0.083% IN NEBU
2.5000 mg | INHALATION_SOLUTION | RESPIRATORY_TRACT | Status: DC | PRN
  Administered 2022-11-10: 2.5 mg via RESPIRATORY_TRACT
  Filled 2022-11-10: qty 3

## 2022-11-10 MED ORDER — PANTOPRAZOLE SODIUM 40 MG PO TBEC
40.0000 mg | DELAYED_RELEASE_TABLET | Freq: Every day | ORAL | Status: DC
  Administered 2022-11-10 – 2022-11-24 (×15): 40 mg via ORAL
  Filled 2022-11-10 (×15): qty 1

## 2022-11-10 MED ORDER — AMLODIPINE BESYLATE 10 MG PO TABS
10.0000 mg | ORAL_TABLET | Freq: Every day | ORAL | Status: DC
  Administered 2022-11-10 – 2022-11-14 (×5): 10 mg via ORAL
  Filled 2022-11-10 (×5): qty 1

## 2022-11-10 MED ORDER — FUROSEMIDE 10 MG/ML IJ SOLN
80.0000 mg | Freq: Once | INTRAMUSCULAR | Status: AC
  Administered 2022-11-10: 80 mg via INTRAVENOUS
  Filled 2022-11-10: qty 8

## 2022-11-10 MED ORDER — ATORVASTATIN CALCIUM 10 MG PO TABS
10.0000 mg | ORAL_TABLET | Freq: Every day | ORAL | Status: DC
  Administered 2022-11-10 – 2022-11-24 (×15): 10 mg via ORAL
  Filled 2022-11-10 (×15): qty 1

## 2022-11-10 MED ORDER — CHLORHEXIDINE GLUCONATE CLOTH 2 % EX PADS
6.0000 | MEDICATED_PAD | Freq: Every day | CUTANEOUS | Status: DC
  Administered 2022-11-10 – 2022-11-12 (×3): 6 via TOPICAL

## 2022-11-10 MED ORDER — ACETAMINOPHEN 325 MG PO TABS
650.0000 mg | ORAL_TABLET | Freq: Four times a day (QID) | ORAL | Status: DC | PRN
  Administered 2022-11-11 – 2022-11-24 (×3): 650 mg via ORAL
  Filled 2022-11-10 (×4): qty 2

## 2022-11-10 MED ORDER — MELATONIN 3 MG PO TABS
3.0000 mg | ORAL_TABLET | Freq: Every evening | ORAL | Status: DC | PRN
  Administered 2022-11-19 – 2022-11-24 (×5): 3 mg via ORAL
  Filled 2022-11-10 (×6): qty 1

## 2022-11-10 MED ORDER — CHLORHEXIDINE GLUCONATE CLOTH 2 % EX PADS
6.0000 | MEDICATED_PAD | Freq: Every day | CUTANEOUS | Status: DC
  Administered 2022-11-11 – 2022-11-18 (×8): 6 via TOPICAL

## 2022-11-10 MED ORDER — BUSPIRONE HCL 5 MG PO TABS
10.0000 mg | ORAL_TABLET | Freq: Two times a day (BID) | ORAL | Status: DC
  Administered 2022-11-10 – 2022-11-24 (×29): 10 mg via ORAL
  Filled 2022-11-10 (×3): qty 1
  Filled 2022-11-10: qty 2
  Filled 2022-11-10 (×5): qty 1
  Filled 2022-11-10: qty 2
  Filled 2022-11-10: qty 1
  Filled 2022-11-10: qty 2
  Filled 2022-11-10 (×2): qty 1
  Filled 2022-11-10: qty 2
  Filled 2022-11-10 (×2): qty 1
  Filled 2022-11-10: qty 2
  Filled 2022-11-10 (×3): qty 1
  Filled 2022-11-10: qty 2
  Filled 2022-11-10 (×4): qty 1
  Filled 2022-11-10 (×4): qty 2

## 2022-11-10 MED ORDER — FLUTICASONE FUROATE-VILANTEROL 100-25 MCG/ACT IN AEPB
1.0000 | INHALATION_SPRAY | Freq: Every day | RESPIRATORY_TRACT | Status: DC
  Administered 2022-11-12 – 2022-11-24 (×12): 1 via RESPIRATORY_TRACT
  Filled 2022-11-10 (×2): qty 28

## 2022-11-10 MED ORDER — SODIUM BICARBONATE 650 MG PO TABS
650.0000 mg | ORAL_TABLET | Freq: Three times a day (TID) | ORAL | Status: DC
  Administered 2022-11-10 (×3): 650 mg via ORAL
  Filled 2022-11-10 (×3): qty 1

## 2022-11-10 MED ORDER — ENSURE ENLIVE PO LIQD
237.0000 mL | Freq: Two times a day (BID) | ORAL | Status: DC
  Administered 2022-11-10 – 2022-11-11 (×4): 237 mL via ORAL

## 2022-11-10 MED ORDER — FERROUS SULFATE 325 (65 FE) MG PO TABS
325.0000 mg | ORAL_TABLET | Freq: Every day | ORAL | Status: DC
  Administered 2022-11-10 – 2022-11-11 (×2): 325 mg via ORAL
  Filled 2022-11-10 (×2): qty 1

## 2022-11-10 MED ORDER — SODIUM BICARBONATE 650 MG PO TABS
650.0000 mg | ORAL_TABLET | Freq: Three times a day (TID) | ORAL | Status: DC
  Administered 2022-11-10 – 2022-11-12 (×6): 650 mg via ORAL
  Filled 2022-11-10 (×6): qty 1

## 2022-11-10 NOTE — Assessment & Plan Note (Addendum)
Hyponatremia.  Volume overload. Patient did not have outpatient HD.  Post HD BUN is down to 32, K is 3,2 and serum bicarbonate at 25. Volume status has improved, she is down 3 Kg from admission.   Follow up with nephrology for further renal replacement therapy.  Vascular surgery consulted for permanent HD access.   Hyper P Continue with P binders  Anemia of chronic renal disease, sp one unit PRBC transfusion with follow up Hgb 8.1  Iron deficiency anemia, with serum iron 7, TIBC 200, transferrin saturation of 4 and ferritin 187.  SP IV iron, added EPO.

## 2022-11-10 NOTE — Progress Notes (Signed)
Hgb down to 6.3 this am. No overt bleeding. Plan to transfuse 1 unit RBCs.

## 2022-11-10 NOTE — Hospital Course (Signed)
Mrs. Dimock was admitted to the hospital with the working diagnosis of volume overload, related to ESRD.   62 yo female with the past medical history of ESRD on HD, COPD, heart failure and hyperlipidemia who presented with worsening lower extremity edema. Recent hospitalization 08/09 to 11/02/22 for sepsis due to pneumonia, complicated with renal failure that required renal replacement therapy. After her discharge she has not been compliant with hemodialysis. She developed worsening painful lower extremity edema, prompting her to come back to the hospital. On her initial physical examination her blood pressure was 146/62, HR 80, RR 16 and 02 saturation 97%, lungs with no wheezing, heart with S1 and S2 present and regular, abdomen with no distention and positive lower extremity edema.   Na 135, K 3,9 Cl 99, bicarbonate 16, glucose 84, bun 59, cr 5,20  BNP 383 High sensitive troponin 23 and 16  Wbc 8,2 hgb 7,1 plt 362   Chest radiograph with right rotation, bilateral interstitial infiltrates, more right than left, right pleural effusion. HD cathter in the internal jugular vein with tip SVC.   EKG 94 bpm, normal axis, normal intervals, sinus rhythm with no significant ST segment or T wave changes.   08/25 hgb down to 6,3 and required one unit PRBC transfusion.  08/26 HD today with ultrafiltration, goal 3 L.  08/27 improved volume status.

## 2022-11-10 NOTE — Consult Note (Signed)
Renal Service Consult Note [facility]  [name] 11/10/2022 Cynthia Krabbe, MD Requesting Physician: Dr. Ella Jubilee   Reason for Consult: ESRD pt w/ SOB and missed HD HPI: The patient is a 62 y.o. year-old w/ PMH as below who presented to ED for SOB, swelling and redness of both legs. Last HD 9 days ago here at [facility] and is homeless and unable to get to her OP unit.   Pt seen in room. In addition, she was at [facility] on [address] and was moved by a SW to [facility] on [address] last time she was in hospital.  She is upset that this was changed to [place] because she stays in town much closer to [employer] and is asking be changed back to [employer].     ROS - denies CP, no joint pain, no HA, no blurry vision, no rash, no diarrhea, no nausea/ vomiting, no dysuria, no difficulty voiding   Past Medical History  Past Medical History:  Diagnosis Date   Alcohol abuse    Allergy    Anxiety    Arthritis    Asthma    Cannabis abuse    Cocaine abuse (HCC)    COPD (chronic obstructive pulmonary disease) (HCC)    Depression    Drug addiction (HCC)    GERD (gastroesophageal reflux disease)    Heart murmur    Homelessness    Hyperlipidemia    Hypertension    pt stated "every once in a while BP will be high but has not been prescribed medication for HTN.    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) 02/22/2016   Past Surgical History  Past Surgical History:  Procedure Laterality Date   AV FISTULA PLACEMENT Right 08/14/2022   Procedure: RIGHT ARM BRACHIOBASILIC ATERIOVENOUS FISTULA CREATION;  Surgeon: Leonie Douglas, MD;  Location: [facility] OR;  Service: Vascular;  Laterality: Right;   BIOPSY  01/01/2019   Procedure: BIOPSY;  Surgeon: Beverley Fiedler, MD;  Location: [facility] ENDOSCOPY;  Service: Endoscopy;;   BIOPSY  11/17/2021   Procedure: BIOPSY;  Surgeon:  Lynann Bologna, MD;  Location: [facility] ENDOSCOPY;  Service: Gastroenterology;;   CESAREAN SECTION  1989   COLONOSCOPY  11/07/2020   2018   CYSTOSCOPY W/ URETERAL STENT PLACEMENT Left 08/01/2018   Procedure: CYSTOSCOPY WITH RETROGRADE PYELOGRAM/URETERAL STENT PLACEMENT;  Surgeon: Crista Elliot, MD;  Location: [facility] ORS;  Service: Urology;  Laterality: Left;   CYSTOSCOPY WITH RETROGRADE PYELOGRAM, URETEROSCOPY AND STENT PLACEMENT Left 01/15/2019   Procedure: CYSTOSCOPY WITH RETROGRADE PYELOGRAM, URETEROSCOPY AND STENT PLACEMENT;  Surgeon: Crista Elliot, MD;  Location: [facility] ORS;  Service: Urology;  Laterality: Left;   ENTEROSCOPY N/A 08/16/2022   Procedure: ENTEROSCOPY;  Surgeon: Meridee Score Netty Starring., MD;  Location: [facility] ENDOSCOPY;  Service: Gastroenterology;  Laterality: N/A;   ENTEROSCOPY N/A 10/31/2022   Procedure: ENTEROSCOPY;  Surgeon: Beverley Fiedler, MD;  Location: [facility] ENDOSCOPY;  Service: Gastroenterology;  Laterality: N/A;   ESOPHAGOGASTRODUODENOSCOPY (EGD) WITH PROPOFOL N/A 01/01/2019   Procedure: ESOPHAGOGASTRODUODENOSCOPY (EGD) WITH PROPOFOL;  Surgeon: Beverley Fiedler, MD;  Location: [facility] ENDOSCOPY;  Service: Endoscopy;  Laterality: N/A;   ESOPHAGOGASTRODUODENOSCOPY (EGD) WITH PROPOFOL N/A 08/21/2019   Procedure: ESOPHAGOGASTRODUODENOSCOPY (EGD) WITH PROPOFOL;  Surgeon: Napoleon Form, MD;  Location: [facility] ENDOSCOPY;  Service: Endoscopy;  Laterality: N/A;  ESOPHAGOGASTRODUODENOSCOPY (EGD) WITH PROPOFOL N/A 11/17/2021   Procedure: ESOPHAGOGASTRODUODENOSCOPY (EGD) WITH PROPOFOL;  Surgeon: Lynann Bologna, MD;  Location: [facility] ENDOSCOPY;  Service: Gastroenterology;  Laterality: N/A;   FRACTURE SURGERY Left 2011   arm   HEMOSTASIS CLIP PLACEMENT  08/16/2022   Procedure: HEMOSTASIS CLIP PLACEMENT;  Surgeon: Lemar Lofty., MD;  Location: [facility] ENDOSCOPY;  Service: Gastroenterology;;   HOT HEMOSTASIS N/A 08/16/2022   Procedure: HOT HEMOSTASIS (ARGON PLASMA COAGULATION/BICAP);  Surgeon: Lemar Lofty., MD;  Location: [facility] ENDOSCOPY;  Service: Gastroenterology;  Laterality: N/A;   HOT HEMOSTASIS N/A 10/31/2022   Procedure: HOT HEMOSTASIS (ARGON PLASMA COAGULATION/BICAP);  Surgeon: Beverley Fiedler, MD;  Location: [facility] ENDOSCOPY;  Service: Gastroenterology;  Laterality: N/A;   INSERTION OF DIALYSIS CATHETER Right 08/14/2022   Procedure: INSERTION OF TUNNELED DIALYSIS CATHETER USING PALINDROME CATHETER KIT 19CM;  Surgeon: Leonie Douglas, MD;  Location: [facility] OR;  Service: Vascular;  Laterality: Right;   POLYPECTOMY  08/16/2022   Procedure: POLYPECTOMY;  Surgeon: Meridee Score Netty Starring., MD;  Location: [facility] ENDOSCOPY;  Service: Gastroenterology;;   SUBMUCOSAL TATTOO INJECTION  08/16/2022   Procedure: SUBMUCOSAL TATTOO INJECTION;  Surgeon: Lemar Lofty., MD;  Location: [facility] ENDOSCOPY;  Service: Gastroenterology;;   UPPER GASTROINTESTINAL ENDOSCOPY     Family History  Family History  Problem Relation Age of Onset   Diabetes Father    Colon cancer Neg Hx    Esophageal cancer Neg Hx    Rectal cancer Neg Hx    Stomach cancer Neg Hx    Social History  reports that she has been smoking cigarettes. She has been exposed to tobacco smoke. She has never used smokeless tobacco. She reports that she does not currently use alcohol. She reports current drug use. Drug: Marijuana. Allergies  Allergies  Allergen Reactions   Zestril [Lisinopril] Other (See Comments)    Hyperkalemia   Home medications Prior to Admission medications   Medication Sig Start Date End Date Taking? Authorizing Provider  acetaminophen (TYLENOL) 500 MG tablet Take 1,000 mg by mouth 2 (two) times daily as needed for moderate pain, fever or headache.    [provider]  albuterol (VENTOLIN HFA) 108 (90 Base) MCG/ACT inhaler Inhale 2 puffs into the lungs every 6 (six) hours as needed for wheezing or shortness of breath. 11/02/22   Almon Hercules, MD  atorvastatin (LIPITOR) 10 MG tablet Take 1 tablet (10 mg total) by  mouth daily. 11/03/22   Almon Hercules, MD  busPIRone (BUSPAR) 10 MG tablet Take 1 tablet (10 mg total) by mouth 2 (two) times daily. 11/02/22   Almon Hercules, MD  ferrous sulfate 325 (65 FE) MG tablet Take 1 tablet (325 mg total) by mouth daily with breakfast. 11/02/22   Almon Hercules, MD  Fluticasone-Umeclidin-Vilant (TRELEGY ELLIPTA) 100-62.5-25 MCG/ACT AEPB Inhale 1 puff into the lungs daily. 11/02/22   Almon Hercules, MD  furosemide (LASIX) 80 MG tablet Take 1 tablet (80 mg total) by mouth every Monday, Wednesday, and Friday. 11/04/22 11/04/23  Almon Hercules, MD  gabapentin (NEURONTIN) 100 MG capsule Take 1 capsule (100 mg total) by mouth 3 (three) times daily. 11/02/22   Almon Hercules, MD  hydrOXYzine (ATARAX) 25 MG tablet Take 1 tablet (25 mg total) by mouth 3 (three) times daily as needed for anxiety or itching. 11/02/22   Almon Hercules, MD  ipratropium-albuterol (DUONEB) 0.5-2.5 (3) MG/3ML SOLN Take 3 mLs by nebulization every 6 (six) hours as needed (shortness of breath  and wheezing). Patient taking differently: Take 3 mLs by nebulization every 6 (six) hours as needed (shortness of breath, wheezing). 01/21/22   Claiborne Rigg, NP  multivitamin (RENA-VIT) TABS tablet Take 1 tablet by mouth at bedtime. 11/02/22   Almon Hercules, MD  oxyCODONE-acetaminophen (PERCOCET/ROXICET) 5-325 MG tablet Take 1 tablet by mouth every 8 (eight) hours as needed for severe pain or moderate pain. 08/05/22   Lorin Glass, MD  pantoprazole (PROTONIX) 40 MG tablet Take 1 tablet (40 mg total) by mouth 2 (two) times daily for 30 days, THEN 1 tablet (40 mg total) daily. 11/02/22 01/31/23  Almon Hercules, MD  sodium bicarbonate 650 MG tablet Take 1 tablet (650 mg total) by mouth 3 (three) times daily. 11/02/22   Almon Hercules, MD  traZODone (DESYREL) 50 MG tablet Take 1 tablet (50 mg total) by mouth at bedtime as needed for sleep. 11/02/22   Candelaria Stagers T, MD     Vitals:   11/10/22 1030 11/10/22 1045 11/10/22 1100 11/10/22 1200   BP: (!) 148/65 (!) 159/69 (!) 157/67 (!) 163/76  Pulse: 76 84 72 85  Resp: 12 (!) 21 18 18   Temp: 98.1 F (36.7 C) 98 F (36.7 C)    TempSrc: Oral Oral    SpO2: 94% 97% 99% 99%  Weight:      Height:       Exam Gen alert, no distress No rash, cyanosis or gangrene Sclera anicteric, throat clear  No jvd or bruits Chest clear bilat to bases, no rales/ wheezing RRR no RG Abd soft ntnd no mass or ascites +bs GU deferred MS no joint effusions or deformity Ext bilat erythema both LE's, 1+ bilat pretib edema, no hip or other edema Neuro is alert, Ox 3 , nf   RIJ TDC+intact     Home meds include - lipitor, buspar, trelegy ellipta, lasix 80mg  mwf, gabapentin 100 tid, duoneb, renavite, percocet prn, protonix, sod bicarb, trazodone      [facility] HD[facility]  3h  52kg  400/1.5   2/2 bath  RIJ TDC  Heparin none - last OP HD 6/27 - last inpt HD was 8/16, 9 days ago - rocaltrol 0.5 mcg po three times per week - venofer 100mg  q hd 8/22- 9/07 - mircera 60 mcg IV q 2 wks, never started   Assessment/ Plan: Vol overload / esrd / missed HD - CXR a bit worse  than when last here. Hasn't made any OP HD sessions due to homelessness. Needs to be switched back to [facility], states the SW changed her to [place] but she is nowhere near close to [place]. Plan re-CLIP back to [place].  ESRD - on HD TTS schedule but not able to make OP dialysis due to homelessness. Needs to be re-clipped back to [facility] where she was before being changed to [place]. States she didn't ask to be moved and she is much closer to [facility] / [address] than to any other unit.  HTN/ volume - BP's look about normal. 2-3kg over dry wt. Not grossly overloaded on exam. Max UF 2.5- 3 L w/ next HD.  Anemia related to CKD/ esrd - Hb 6.3/ 7.1 here. Getting prbc's here. Hasn't had esa recently at OP unit. Plan mircera 60 mcg SQ weekly while here.   MBD ckd - CCa in range, phos is 8.4 which is high. Cont  Homelessness       Vinson Moselle  MD [facility] 11/10/2022,  12:52 PM  Recent Labs  Lab 11/09/22 2126 11/10/22 0326  HGB 7.1* 6.3*  ALBUMIN 2.6* 2.2*  CALCIUM 7.5* 7.0*  PHOS  --  8.4*  CREATININE 5.20* 5.24*  K 3.9 3.5   Inpatient medications:  atorvastatin  10 mg Oral Daily   busPIRone  10 mg Oral BID   Chlorhexidine Gluconate Cloth  6 each Topical Daily   feeding supplement  237 mL Oral BID BM   ferrous sulfate  325 mg Oral Q breakfast   fluticasone furoate-vilanterol  1 puff Inhalation Daily   And   umeclidinium bromide  1 puff Inhalation Daily   gabapentin  100 mg Oral TID   heparin  5,000 Units Subcutaneous Q8H   pantoprazole  40 mg Oral Daily   sodium bicarbonate  650 mg Oral TID    acetaminophen **OR** acetaminophen, albuterol, melatonin, ondansetron (ZOFRAN) IV, oxyCODONE-acetaminophen

## 2022-11-10 NOTE — Assessment & Plan Note (Signed)
Will add amlodipine for blood pressure control. Continue blood pressure monitoring.

## 2022-11-10 NOTE — Assessment & Plan Note (Signed)
Continue with statin therapy.  ?

## 2022-11-10 NOTE — Assessment & Plan Note (Signed)
Present on admission.  Stage 2 pressure ulcer left buttock and coccyx.  Continue local skin care.

## 2022-11-10 NOTE — Assessment & Plan Note (Signed)
No signs of acute exacerbation Continue with bronchodilator therapy.

## 2022-11-10 NOTE — Plan of Care (Signed)
  Problem: Education: Goal: Knowledge of General Education information will improve Description: Including pain rating scale, medication(s)/side effects and non-pharmacologic comfort measures Outcome: Progressing   Problem: Clinical Measurements: Goal: Ability to maintain clinical measurements within normal limits will improve Outcome: Progressing Goal: Will remain free from infection Outcome: Progressing   Problem: Activity: Goal: Risk for activity intolerance will decrease Outcome: Progressing   Problem: Nutrition: Goal: Adequate nutrition will be maintained Outcome: Progressing   Problem: Coping: Goal: Level of anxiety will decrease Outcome: Progressing   Problem: Pain Managment: Goal: General experience of comfort will improve Outcome: Progressing   Problem: Safety: Goal: Ability to remain free from injury will improve Outcome: Progressing   Problem: Skin Integrity: Goal: Risk for impaired skin integrity will decrease Outcome: Progressing   Problem: Activity: Goal: Capacity to carry out activities will improve Outcome: Progressing

## 2022-11-10 NOTE — Assessment & Plan Note (Signed)
Volume overload related to ESRD.  Urine output is 1,500 ml  Plan for furosemide IV 80 mg one dose Ultrafiltration per HD.

## 2022-11-10 NOTE — Plan of Care (Signed)

## 2022-11-10 NOTE — Assessment & Plan Note (Addendum)
Resume hydroxyzine for anxiety. Continue with buspirone.  Continue neuro checks per unit protocol.

## 2022-11-10 NOTE — H&P (Signed)
History and Physical      [name] ZOX:[id number] DOB: 11/09/1960 DOA: 11/09/2022; DOS: 11/10/2022  PCP: [name], MD  Patient coming from: home   I have personally briefly reviewed patient's old medical records in [facility]  Chief Complaint: Worsening lower extremity edema  HPI: [name] is a 62 y.o. female with medical history significant for end-stage renal disease on hemodialysis, GAD, COPD, hyperlipidemia, chronic diastolic heart failure, anemia of chronic kidney disease associated with baseline hemoglobin range of 7-9, who is admitted to [facility] on 11/09/2022 with acute on chronic diastolic heart failure after presenting from home to [facility] ED complaining of worsening of bilateral lower extremity edema.   The patient was recently hospitalized at [facility] from 10/25/2022 to 11/02/2022 with hospital course reported to include sepsis due to pneumonia complicated by uremia in the setting of noncompliant with hemodialysis in the setting of her underlying end-stage renal disease.   The patient was subsequently discharged to a local motel in the setting of homelessness on 11/02/2022.  She notes that she has been noncompliant with attending any of her scheduled hemodialysis sessions in the interval since discharge, and now presents to [facility] emergency department this evening complaining of worsening edema in the bilateral lower extremities associated with discomfort in the bilateral lower extremities.  Denies any associated calf tenderness or new lower extremity erythema.  She notes a history of peripheral polyneuropathy for which she is on gabapentin as an outpatient.  Not associate with any chest pain, shortness of breath, orthopnea, PND, nausea, vomiting, palpitations, diaphoresis, dizziness, presyncope, or syncope.  Denies any associated subjective fever, chills, rigors, or generalized myalgias.    ED Course:  Vital signs in the ED were notable for the  following: Afebrile; heart rate 79-92; stop blood pressures in the 140s to 170s; respiratory rate 16-18, oxygen saturation 97 to 100% on room air.  Labs were notable for the following: CMP notable for the following: Sodium 135, potassium 3.9, bicarbonate 16, anion gap 20, BUN 59, creatinine 5.20, calcium, adjusted for mild hypoalbuminemia noted to be 8.7, abdomen 2.6, AST 44, otherwise liver enzymes within normal limits.  Magnesium level 1.9.  High-sensitivity troponin I initially 23, 3 value trending down to 16, relative to most recent prior high sensitive troponin I value of 91 on 10/30/2022.  CBC notable for with cell count 8200, hemoglobin 7.1 relative to most recent prior hemoglobin data point of 8.1 on 11/02/2022.  Per my interpretation, EKG in ED demonstrated the following: Sinus rhythm with heart rate 94, borderline QTc prolongation with QTc noted to be 492 MS, and no evidence of T wave or ST changes, including no evidence of ST elevation.  Imaging in the ED, per corresponding formal radiology read, was notable for the following: 2 view chest x-ray, in comparison to most recent prior chest x-ray from 10/30/2022, shows interval increase in pulmonary vascular congestion as well as interval increase in right pleural effusion associated atelectasis, in the absence of any evidence of pneumothorax.  While in the ED, the following were administered: Gabapentin 100 mg p.o. x 1, oxycodone 5 mg p.o. x 1.  EDP has discussed patient's case with on-call nephrologist, Dr. Arlean Hopping, Who will consult and assist with arranging for hemodialysis on the morning of 11/10/2022.  Subsequently, the patient was admitted for further evaluation management of acute on chronic diastolic heart failure, with presenting labs also notable for anion gap metabolic acidosis.     Review of  Systems: As per HPI otherwise 10 point review of systems negative.   Past Medical History:  Diagnosis Date   Alcohol abuse    Allergy     Anxiety    Arthritis    Asthma    Cannabis abuse    Cocaine abuse (HCC)    COPD (chronic obstructive pulmonary disease) (HCC)    Depression    Drug addiction (HCC)    GERD (gastroesophageal reflux disease)    Heart murmur    Homelessness    Hyperlipidemia    Hypertension    pt stated "every once in a while BP will be high but has not been prescribed medication for HTN.    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) 02/22/2016    Past Surgical History:  Procedure Laterality Date   AV FISTULA PLACEMENT Right 08/14/2022   Procedure: RIGHT ARM BRACHIOBASILIC ATERIOVENOUS FISTULA CREATION;  Surgeon: Leonie Douglas, MD;  Location: [facility] OR;  Service: Vascular;  Laterality: Right;   BIOPSY  01/01/2019   Procedure: BIOPSY;  Surgeon: Beverley Fiedler, MD;  Location: [facility] ENDOSCOPY;  Service: Endoscopy;;   BIOPSY  11/17/2021   Procedure: BIOPSY;  Surgeon: Lynann Bologna, MD;  Location: [facility] ENDOSCOPY;  Service: Gastroenterology;;   CESAREAN SECTION  1989   COLONOSCOPY  11/07/2020   2018   CYSTOSCOPY W/ URETERAL STENT PLACEMENT Left 08/01/2018   Procedure: CYSTOSCOPY WITH RETROGRADE PYELOGRAM/URETERAL STENT PLACEMENT;  Surgeon: Crista Elliot, MD;  Location: [facility] ORS;  Service: Urology;  Laterality: Left;   CYSTOSCOPY WITH RETROGRADE PYELOGRAM, URETEROSCOPY AND STENT PLACEMENT Left 01/15/2019   Procedure: CYSTOSCOPY WITH RETROGRADE PYELOGRAM, URETEROSCOPY AND STENT PLACEMENT;  Surgeon: Crista Elliot, MD;  Location: [facility] ORS;  Service: Urology;  Laterality: Left;   ENTEROSCOPY N/A 08/16/2022   Procedure: ENTEROSCOPY;  Surgeon: Meridee Score Netty Starring., MD;  Location: [facility] ENDOSCOPY;  Service: Gastroenterology;  Laterality: N/A;   ENTEROSCOPY N/A 10/31/2022   Procedure: ENTEROSCOPY;  Surgeon: Beverley Fiedler, MD;  Location: [facility] ENDOSCOPY;  Service: Gastroenterology;  Laterality: N/A;    ESOPHAGOGASTRODUODENOSCOPY (EGD) WITH PROPOFOL N/A 01/01/2019   Procedure: ESOPHAGOGASTRODUODENOSCOPY (EGD) WITH PROPOFOL;  Surgeon: Beverley Fiedler, MD;  Location: [facility] ENDOSCOPY;  Service: Endoscopy;  Laterality: N/A;   ESOPHAGOGASTRODUODENOSCOPY (EGD) WITH PROPOFOL N/A 08/21/2019   Procedure: ESOPHAGOGASTRODUODENOSCOPY (EGD) WITH PROPOFOL;  Surgeon: Napoleon Form, MD;  Location: [facility] ENDOSCOPY;  Service: Endoscopy;  Laterality: N/A;   ESOPHAGOGASTRODUODENOSCOPY (EGD) WITH PROPOFOL N/A 11/17/2021   Procedure: ESOPHAGOGASTRODUODENOSCOPY (EGD) WITH PROPOFOL;  Surgeon: Lynann Bologna, MD;  Location: [facility] ENDOSCOPY;  Service: Gastroenterology;  Laterality: N/A;   FRACTURE SURGERY Left 2011   arm   HEMOSTASIS CLIP PLACEMENT  08/16/2022   Procedure: HEMOSTASIS CLIP PLACEMENT;  Surgeon: Lemar Lofty., MD;  Location: [facility] ENDOSCOPY;  Service: Gastroenterology;;   HOT HEMOSTASIS N/A 08/16/2022   Procedure: HOT HEMOSTASIS (ARGON PLASMA COAGULATION/BICAP);  Surgeon: Lemar Lofty., MD;  Location: [facility] ENDOSCOPY;  Service: Gastroenterology;  Laterality: N/A;   HOT HEMOSTASIS N/A 10/31/2022   Procedure: HOT HEMOSTASIS (ARGON PLASMA COAGULATION/BICAP);  Surgeon: Beverley Fiedler, MD;  Location: [facility] ENDOSCOPY;  Service: Gastroenterology;  Laterality: N/A;   INSERTION OF DIALYSIS CATHETER Right 08/14/2022   Procedure: INSERTION OF TUNNELED DIALYSIS CATHETER USING PALINDROME CATHETER KIT 19CM;  Surgeon: Leonie Douglas, MD;  Location: [facility] OR;  Service: Vascular;  Laterality: Right;   POLYPECTOMY  08/16/2022   Procedure: POLYPECTOMY;  Surgeon: Mansouraty, Netty Starring., MD;  Location: [facility] ENDOSCOPY;  Service: Gastroenterology;;   SUBMUCOSAL TATTOO INJECTION  08/16/2022   Procedure: SUBMUCOSAL TATTOO INJECTION;  Surgeon: Lemar Lofty., MD;  Location: [facility] ENDOSCOPY;  Service: Gastroenterology;;   UPPER GASTROINTESTINAL ENDOSCOPY      Social History:  reports that she has been smoking cigarettes. She  has been exposed to tobacco smoke. She has never used smokeless tobacco. She reports that she does not currently use alcohol. She reports current drug use. Drug: Marijuana.   Allergies  Allergen Reactions   Zestril [Lisinopril] Other (See Comments)    Hyperkalemia    Family History  Problem Relation Age of Onset   Diabetes Father    Colon cancer Neg Hx    Esophageal cancer Neg Hx    Rectal cancer Neg Hx    Stomach cancer Neg Hx     Family history reviewed and not pertinent    Prior to Admission medications   Medication Sig Start Date End Date Taking? Authorizing Provider  acetaminophen (TYLENOL) 500 MG tablet Take 1,000 mg by mouth 2 (two) times daily as needed for moderate pain, fever or headache.    [provider]  albuterol (VENTOLIN HFA) 108 (90 Base) MCG/ACT inhaler Inhale 2 puffs into the lungs every 6 (six) hours as needed for wheezing or shortness of breath. 11/02/22   Almon Hercules, MD  atorvastatin (LIPITOR) 10 MG tablet Take 1 tablet (10 mg total) by mouth daily. 11/03/22   Almon Hercules, MD  busPIRone (BUSPAR) 10 MG tablet Take 1 tablet (10 mg total) by mouth 2 (two) times daily. 11/02/22   Almon Hercules, MD  ferrous sulfate 325 (65 FE) MG tablet Take 1 tablet (325 mg total) by mouth daily with breakfast. 11/02/22   Almon Hercules, MD  Fluticasone-Umeclidin-Vilant (TRELEGY ELLIPTA) 100-62.5-25 MCG/ACT AEPB Inhale 1 puff into the lungs daily. 11/02/22   Almon Hercules, MD  furosemide (LASIX) 80 MG tablet Take 1 tablet (80 mg total) by mouth every Monday, Wednesday, and Friday. 11/04/22 11/04/23  Almon Hercules, MD  gabapentin (NEURONTIN) 100 MG capsule Take 1 capsule (100 mg total) by mouth 3 (three) times daily. 11/02/22   Almon Hercules, MD  hydrOXYzine (ATARAX) 25 MG tablet Take 1 tablet (25 mg total) by mouth 3 (three) times daily as needed for anxiety or itching. 11/02/22   Almon Hercules, MD  ipratropium-albuterol (DUONEB) 0.5-2.5 (3) MG/3ML SOLN Take 3 mLs by  nebulization every 6 (six) hours as needed (shortness of breath and wheezing). Patient taking differently: Take 3 mLs by nebulization every 6 (six) hours as needed (shortness of breath, wheezing). 01/21/22   Claiborne Rigg, NP  multivitamin (RENA-VIT) TABS tablet Take 1 tablet by mouth at bedtime. 11/02/22   Almon Hercules, MD  oxyCODONE-acetaminophen (PERCOCET/ROXICET) 5-325 MG tablet Take 1 tablet by mouth every 8 (eight) hours as needed for severe pain or moderate pain. 08/05/22   Lorin Glass, MD  pantoprazole (PROTONIX) 40 MG tablet Take 1 tablet (40 mg total) by mouth 2 (two) times daily for 30 days, THEN 1 tablet (40 mg total) daily. 11/02/22 01/31/23  Almon Hercules, MD  sodium bicarbonate 650 MG tablet Take 1 tablet (650 mg total) by mouth 3 (three) times daily. 11/02/22   Almon Hercules, MD  traZODone (DESYREL) 50 MG tablet Take 1 tablet (50 mg total) by mouth at bedtime  as needed for sleep. 11/02/22   Almon Hercules, MD     Objective    Physical Exam: Vitals:   11/10/22 0000 11/10/22 0015 11/10/22 0055 11/10/22 0119  BP: (!) 146/62 (!) 153/75    Pulse:  80    Resp: 17 16    Temp:   97.8 F (36.6 C)   TempSrc:   Oral   SpO2: 98% 97%    Weight:    54.3 kg  Height:    5\' 7"  (1.702 m)    General: appears to be stated age; alert, oriented Skin: warm, dry, no rash Head:  AT/[place] Mouth:  Oral mucosa membranes appear moist, normal dentition Neck: supple; trachea midline Heart:  RRR; did not appreciate any M/R/G Lungs: CTAB, did not appreciate any wheezes, rales, or rhonchi Abdomen: + BS; soft, ND, NT Vascular: 2+ pedal pulses b/l; 2+ radial pulses b/l Extremities: 2+ edema in the bilateral lower extremities, no muscle wasting Neuro: strength and sensation intact in upper and lower extremities b/l     Labs on Admission: I have personally reviewed following labs and imaging studies  CBC: Recent Labs  Lab 11/09/22 2126  WBC 8.2  NEUTROABS 5.5  HGB 7.1*  HCT 22.5*  MCV 98.3   PLT 362   Basic Metabolic Panel: Recent Labs  Lab 11/09/22 2126  NA 135  K 3.9  CL 99  CO2 16*  GLUCOSE 84  BUN 59*  CREATININE 5.20*  CALCIUM 7.5*  MG 1.9   GFR: Estimated Creatinine Clearance: 9.6 mL/min (A) (by C-G formula based on SCr of 5.2 mg/dL (H)). Liver Function Tests: Recent Labs  Lab 11/09/22 2126  AST 44*  ALT 25  ALKPHOS 97  BILITOT 0.5  PROT 6.6  ALBUMIN 2.6*   No results for input(s): "LIPASE", "AMYLASE" in the last 168 hours. No results for input(s): "AMMONIA" in the last 168 hours. Coagulation Profile: No results for input(s): "INR", "PROTIME" in the last 168 hours. Cardiac Enzymes: No results for input(s): "CKTOTAL", "CKMB", "CKMBINDEX", "TROPONINI" in the last 168 hours. BNP (last 3 results) No results for input(s): "PROBNP" in the last 8760 hours. HbA1C: No results for input(s): "HGBA1C" in the last 72 hours. CBG: No results for input(s): "GLUCAP" in the last 168 hours. Lipid Profile: No results for input(s): "CHOL", "HDL", "LDLCALC", "TRIG", "CHOLHDL", "LDLDIRECT" in the last 72 hours. Thyroid Function Tests: No results for input(s): "TSH", "T4TOTAL", "FREET4", "T3FREE", "THYROIDAB" in the last 72 hours. Anemia Panel: No results for input(s): "VITAMINB12", "FOLATE", "FERRITIN", "TIBC", "IRON", "RETICCTPCT" in the last 72 hours. Urine analysis:    Component Value Date/Time   COLORURINE YELLOW 10/25/2022 2015   APPEARANCEUR HAZY (A) 10/25/2022 2015   LABSPEC 1.009 10/25/2022 2015   PHURINE 5.0 10/25/2022 2015   GLUCOSEU 50 (A) 10/25/2022 2015   HGBUR LARGE (A) 10/25/2022 2015   BILIRUBINUR NEGATIVE 10/25/2022 2015   KETONESUR NEGATIVE 10/25/2022 2015   PROTEINUR 100 (A) 10/25/2022 2015   UROBILINOGEN 0.2 03/02/2010 2027   NITRITE POSITIVE (A) 10/25/2022 2015   LEUKOCYTESUR MODERATE (A) 10/25/2022 2015    Radiological Exams on Admission: DG Chest 2 View  Result Date: 11/09/2022 CLINICAL DATA:  Cough and shortness of breath. EXAM:  CHEST - 2 VIEW COMPARISON:  10/30/2022 FINDINGS: Right-sided dialysis catheter in place. Increasing right lung base opacity and volume loss, atelectasis/airspace disease and possible effusion. Stable upper normal heart size. Vascular congestion. No pneumothorax. Improved aeration at the left lung base. IMPRESSION: 1. Increasing right lung base  opacity and volume loss, likely progression of atelectasis/airspace disease and possible effusion. 2. Improved aeration at the left lung base. 3. Vascular congestion. Electronically Signed   By: Narda Rutherford M.D.   On: 11/09/2022 22:57      Assessment/Plan   Principal Problem:   Acute on chronic diastolic CHF (congestive heart failure) (HCC) Active Problems:   High anion gap metabolic acidosis   End-stage renal disease on hemodialysis (HCC)   Hyperlipidemia   Volume overload   GAD (generalized anxiety disorder)   History of COPD   History of anemia due to chronic kidney disease       #) Acute on chronic diastolic heart failure: dx of acute decompensation on the basis of presenting 4 to 5 days of progressive edema involving the bilateral lower extremities, with chest x-ray showing interval increase in pulmonary vascular congestion as well as increase in size of right pleural effusion. This is in the context of a known history of chronic diastolic heart failure, with most recent echocardiogram performed 08/13/2022, which was notable for LVEF 55 to 60%, no focal wall motion abnormalities, grade 1 diastolic dysfunction by normal right ventricular systolic function and mild mitral regurgitation.  Etiology leading to presenting acutely decompensated heart failure appears to be on the basis of the patient's noncompliance and attending in a very scheduled hemodialysis sessions since discharge from the hospital 1 week ago in the context of underlying end-stage renal disease.   Overall, ACS leading to presenting acutely decompensated heart failure appears  less likely at this time in the absence of any recent CP, presenting EKG showing no evidence of acute ischemic changes.  Additionally, troponin is noted to be nonelevated relative to troponin values in the 90s at time of most recent prior hospitalization.  Of note, the patient does not appear to be in acute respiratory distress at this time, and is noted to be maintaining oxygen saturations in the high 90s to 100% on room air.  EDP has discussed patient's case with on-call nephrologist, Dr. Arlean Hopping, Who will consult and assist with arranging for hemodialysis on the morning of 11/10/2022.  Plan: monitor strict I's & O's and daily weights. Monitor on telemetry. CMP in the morning.  Nephrology to consult, with plan for hemodialysis in the morning, as above.  Add on BNP.  Counseled the patient on the importance of improved compliance and attending her outpatient hemodialysis sessions.                 #) Bilateral lower extremity discomfort: The patient notes worsening discomfort in her bilateral lower extremities over the last 4 to 5 days, which appears to be on the basis of her worsening peripheral edema over that timeframe as a consequence of suboptimal compliance with attendance of her scheduled hemodialysis sessions, superimposed on baseline peripheral polyneuropathy for which she is on gabapentin at home.  Of note, she is also on prn oxycodone as an outpatient.  No clinical evidence to suggest cellulitis or DVT at this time.  Will assess for improvement in pain control with improvement in presenting acute volume overload with hemodialysis session that is anticipated to occur in the morning.  Plan: Nephrology consulted, with plan for hemodialysis session in the morning.  Resume home gabapentin and prn oxycodone.  Monitor strict I's and O's and daily weights.                  #) Anion gap metabolic acidosis: Noted on presenting labs, and appears to be as  a consequence of  noncompliance with hemodialysis sessions over the course of the last week in the setting of end-stage renal disease.  She has noted to be prescribed scheduled oral sodium bicarbonate as an outpatient, while noting suboptimal compliance with this medication.  \  Plan: Nephrology to formally consult and arrange for hemodialysis to occur in the morning.  Will resume outpatient oral sodium bicarbonate.  Monitor strict I's and O's Daily weights.  Repeat CMP in the morning.  Check INR.  CBC in the morning.                 #) ESRD: on HD, but with notable suboptimal compliance, noting no attended hemodialysis sessions in the weeks since being discharged from the hospital on 11/02/2022.  As a consequence, this evening's presentation is associated with acute volume overload complicated by acute on chronic diastolic heart failure as well as anion gap metabolic acidosis.  She does not appear to be overtly uremic at this time, and potassium level is noted to be within normal limits.  The patient does not appear to be in acute respiratory distress, and is maintaining oxygen saturations in the high 90s on room air.   EDP has discussed patient's case with on-call nephrologist, Dr. Arlean Hopping, Who will consult and assist with arranging for hemodialysis on the morning of 11/10/2022.  No clinical evidence for urgent overnight HD or to expedite HD relative to this timeframe.   Plan: monitor strict I's/O's, daily weights. CMP in the AM. Check mag and phos levels.  Nephrology to formally consult, with plan for hemodialysis in the morning.  Resume outpatient sodium bicarbonate.  Further evaluation management of presenting acute on chronic diastolic heart failure, as above.  Emphasized the importance of improved compliance and attending scheduled outpatient hemodialysis sessions.                     #) Generalized anxiety disorder: documented h/o such. On BuSpar as outpatient.    Plan: Resume  outpatient BuSpar.                #) COPD: Documented history thereof, without clinical evidence of acute exacerbation at this time.  Outpatient respiratory regimen includes the following: Trelegy, prn albuterol inhaler.   Plan: cont outpatient Trelegy. Prn albuterol nebulizer. Check CMP and serum magnesium level in the AM.                   #) Hyperlipidemia: documented h/o such. On atorvastatin as outpatient.   Plan: continue home statin.                  #) Anemia of chronic kidney disease: Documented history of such, a/w with baseline hgb range 7-9, with presenting hgb consistent with this range, in the absence of any overt evidence of active bleed.  She has noted to be on daily oral iron supplementation as an outpatient.   Plan: Repeat CBC in the morning.  Resume outpatient oral iron supplementation.  Check INR.     DVT prophylaxis: Heparin 5000 units sq tid  Code Status: Full code Family Communication: none Disposition Plan: Per Rounding Team Consults called: EDP discussed patient's case with on-call nephrology, Dr. Arlean Hopping, Who will consult and assist with arrangements for hemodialysis to occur on the morning of 11/10/2022, as further detailed above;  Admission status: Observation     I SPENT GREATER THAN 75  MINUTES IN CLINICAL CARE TIME/MEDICAL DECISION-MAKING IN COMPLETING THIS ADMISSION.  Chaney Born Gearold Wainer DO [facility]  From 7PM - 7AM   11/10/2022, 1:32 AM

## 2022-11-10 NOTE — Progress Notes (Signed)
  Progress Note   Patient: [name] DOB: Dec 15, 1960 DOA: 11/09/2022     0 DOS: the patient was seen and examined on 11/10/2022   Brief hospital course: [name] was admitted to the hospital with the working diagnosis of volume overload, related to ESRD.   62 yo female with the past medical history of ESRD on HD, COPD, heart failure and hyperlipidemia who presented with worsening lower extremity edema. Recent hospitalization 08/09 to 11/02/22 for sepsis due to pneumonia, complicated with renal failure that required renal replacement therapy. After her discharge she has not been compliant with hemodialysis. She developed worsening painful lower extremity edema, prompting her to come back to the hospital.   Assessment and Plan: * End-stage renal disease on hemodialysis (HCC) Hyponatremia.  Volume overload.  Patient did not have outpatient HD.  Today BUN is 56, K 3,5 and serum bicarbonate at 18, with anion gap of 12. P 8.4   Plan to resume renal replacement therapy inpatient per nephrology recommendations.  Will need a new outpatient HD clip.   Hyper P Add P binders  Non anion gap metabolic acidosis, continue oral bicarbonate.   Anemia of chronic renal disease, sp one unit PRBC transfusion with follow up Hgb 7,3  Iron deficiency anemia, with serum iron 7, TIBC 200, transferrin saturation of 4 and ferritin 187.  Will plan for IV iron during this hospitalization.   Acute on chronic diastolic CHF (congestive heart failure) (HCC) Volume overload related to ESRD.  Urine output is 1,500 ml  Plan for furosemide IV 80 mg one dose Ultrafiltration per HD.    Essential hypertension Will add amlodipine for blood pressure control. Continue blood pressure monitoring.   History of COPD No signs of acute exacerbation Continue with bronchodilator therapy.   Hyperlipidemia Continue with statin therapy.   GAD (generalized anxiety disorder) Resume hydroxyzine for  anxiety. Continue with buspirone.  Continue neuro checks per unit protocol.   Pressure injury of skin Present on admission.  Stage 2 pressure ulcer left buttock and coccyx.  Continue local skin care.         Subjective: Patient is feeling better, but continue to have painful edema at her lower extremities.   Physical Exam: Vitals:   11/10/22 1400 11/10/22 1500 11/10/22 1600 11/10/22 1603  BP:  (!) 172/73 (!) 169/77   Pulse: 76 77 77 78  Resp: 14 13 15 15   Temp:  98.7 F (37.1 C)    TempSrc:  Oral    SpO2: 95% 95% 94% 100%  Weight:      Height:       Neurology awake and alert ENT with mild pallor Cardiovascular with S1 and S2 present and regular with no gallops Respiratory with bilateral rales with no wheezing or rhonchi Abdomen with no distention Positive lower extremity edema ++ Data Reviewed:    Family Communication: no family at the bedside  Disposition: Status is: Inpatient Remains inpatient appropriate because: inpatient renal replacement therapy   Planned Discharge Destination: Home    Author: Coralie Keens, MD 11/10/2022 5:11 PM  For on call review [email or web address].

## 2022-11-11 DIAGNOSIS — Z8709 Personal history of other diseases of the respiratory system: Secondary | ICD-10-CM | POA: Diagnosis not present

## 2022-11-11 DIAGNOSIS — I10 Essential (primary) hypertension: Secondary | ICD-10-CM | POA: Diagnosis not present

## 2022-11-11 DIAGNOSIS — I5033 Acute on chronic diastolic (congestive) heart failure: Secondary | ICD-10-CM | POA: Diagnosis not present

## 2022-11-11 DIAGNOSIS — N186 End stage renal disease: Secondary | ICD-10-CM | POA: Diagnosis not present

## 2022-11-11 LAB — RENAL FUNCTION PANEL
Albumin: 2.1 g/dL — ABNORMAL LOW (ref 3.5–5.0)
Anion gap: 15 (ref 5–15)
BUN: 61 mg/dL — ABNORMAL HIGH (ref 8–23)
CO2: 18 mmol/L — ABNORMAL LOW (ref 22–32)
Calcium: 7.2 mg/dL — ABNORMAL LOW (ref 8.9–10.3)
Chloride: 102 mmol/L (ref 98–111)
Creatinine, Ser: 5.56 mg/dL — ABNORMAL HIGH (ref 0.44–1.00)
GFR, Estimated: 8 mL/min — ABNORMAL LOW (ref >60)
Glucose, Bld: 87 mg/dL (ref 70–99)
Phosphorus: 7.9 mg/dL — ABNORMAL HIGH (ref 2.5–4.6)
Potassium: 4.3 mmol/L (ref 3.5–5.1)
Sodium: 135 mmol/L (ref 135–145)

## 2022-11-11 LAB — CBC
HCT: 24.5 % — ABNORMAL LOW (ref 36.0–46.0)
Hemoglobin: 8.1 g/dL — ABNORMAL LOW (ref 12.0–15.0)
MCH: 31 pg (ref 26.0–34.0)
MCHC: 33.1 g/dL (ref 30.0–36.0)
MCV: 93.9 fL (ref 80.0–100.0)
Platelets: 360 10*3/uL (ref 150–400)
RBC: 2.61 MIL/uL — ABNORMAL LOW (ref 3.87–5.11)
RDW: 19.2 % — ABNORMAL HIGH (ref 11.5–15.5)
WBC: 5.4 10*3/uL (ref 4.0–10.5)
nRBC: 0 % (ref 0.0–0.2)

## 2022-11-11 LAB — TYPE AND SCREEN
ABO/RH(D): A POS
Antibody Screen: NEGATIVE
Unit division: 0

## 2022-11-11 LAB — BPAM RBC
Blood Product Expiration Date: 202409152359
ISSUE DATE / TIME: 202408251028
Unit Type and Rh: 6200

## 2022-11-11 MED ORDER — ANTICOAGULANT SODIUM CITRATE 4% (200MG/5ML) IV SOLN: 5.0000 mL | Status: DC | PRN

## 2022-11-11 MED ORDER — NICOTINE 21 MG/24HR TD PT24
21.0000 mg | MEDICATED_PATCH | Freq: Every day | TRANSDERMAL | Status: DC
  Administered 2022-11-11 – 2022-11-24 (×14): 21 mg via TRANSDERMAL
  Filled 2022-11-11 (×14): qty 1

## 2022-11-11 MED ORDER — MEDIHONEY WOUND/BURN DRESSING EX PSTE
1.0000 | PASTE | Freq: Every day | CUTANEOUS | Status: DC
  Administered 2022-11-11 – 2022-11-23 (×12): 1 via TOPICAL
  Filled 2022-11-11: qty 44

## 2022-11-11 MED ORDER — PROSOURCE PLUS PO LIQD
30.0000 mL | Freq: Two times a day (BID) | ORAL | Status: DC
  Administered 2022-11-11 – 2022-11-24 (×19): 30 mL via ORAL
  Filled 2022-11-11 (×23): qty 30

## 2022-11-11 MED ORDER — PENTAFLUOROPROP-TETRAFLUOROETH EX AERO: 1.0000 | INHALATION_SPRAY | CUTANEOUS | Status: DC | PRN

## 2022-11-11 MED ORDER — RENA-VITE PO TABS
1.0000 | ORAL_TABLET | Freq: Every day | ORAL | Status: DC
  Administered 2022-11-11 – 2022-11-24 (×13): 1 via ORAL
  Filled 2022-11-11 (×13): qty 1

## 2022-11-11 MED ORDER — LIDOCAINE-PRILOCAINE 2.5-2.5 % EX CREA: 1.0000 | TOPICAL_CREAM | CUTANEOUS | Status: DC | PRN

## 2022-11-11 MED ORDER — LIDOCAINE HCL (PF) 1 % IJ SOLN: 5.0000 mL | INTRAMUSCULAR | Status: DC | PRN

## 2022-11-11 MED ORDER — NEPRO/CARBSTEADY PO LIQD: 237.0000 mL | ORAL | Status: DC | PRN

## 2022-11-11 MED ORDER — LIDOCAINE VISCOUS HCL 2 % MT SOLN
15.0000 mL | Freq: Four times a day (QID) | OROMUCOSAL | Status: DC | PRN
  Filled 2022-11-11: qty 15

## 2022-11-11 MED ORDER — MAGIC MOUTHWASH: 5.0000 mL | Freq: Three times a day (TID) | ORAL | Status: DC | PRN

## 2022-11-11 MED ORDER — HEPARIN SODIUM (PORCINE) 1000 UNIT/ML DIALYSIS
1000.0000 [IU] | INTRAMUSCULAR | Status: DC | PRN
  Administered 2022-11-11: 3200 [IU]
  Filled 2022-11-11 (×3): qty 1

## 2022-11-11 MED ORDER — ALTEPLASE 2 MG IJ SOLR: 2.0000 mg | Freq: Once | INTRAMUSCULAR | Status: DC | PRN

## 2022-11-11 MED ORDER — SODIUM CHLORIDE 0.9 % IV SOLN
250.0000 mg | Freq: Every day | INTRAVENOUS | Status: AC
  Administered 2022-11-11 – 2022-11-14 (×3): 250 mg via INTRAVENOUS
  Filled 2022-11-11 (×4): qty 20

## 2022-11-11 MED ORDER — DARBEPOETIN ALFA 100 MCG/0.5ML IJ SOSY
100.0000 ug | PREFILLED_SYRINGE | INTRAMUSCULAR | Status: DC
  Administered 2022-11-11: 100 ug via SUBCUTANEOUS
  Filled 2022-11-11 (×2): qty 0.5

## 2022-11-11 NOTE — Progress Notes (Signed)
Initial Nutrition Assessment  DOCUMENTATION CODES:   Severe malnutrition in context of social or environmental circumstances  INTERVENTION:  Continue regular diet in setting of malnutrition Snacks TID between meals Ensure Enlive po BID, each supplement provides 350 kcal and 20 grams of protein. Renal MVI with minerals daily  NUTRITION DIAGNOSIS:   Severe Malnutrition related to social / environmental circumstances as evidenced by severe fat depletion, severe muscle depletion, energy intake < or equal to 75% for > or equal to 1 month.  GOAL:   Patient will meet greater than or equal to 90% of their needs  MONITOR:   PO intake, Supplement acceptance, Labs, Weight trends, I & O's  REASON FOR ASSESSMENT:   Malnutrition Screening Tool    ASSESSMENT:   Pt admitted with volume overload secondary to ESRD. PMH significant for ESRD on HD, COPD, HF, HLD. Recently admitted 8/9-8/17 for sepsis d/t PNA c/b renal failure requiring RRT.  Pt previously assessed during prior admission by RD team.   Spoke with pt at bedside. She reports that she has not been eating well since last admission d/t ongoing homelessness and ability to consistently access food. She states that she is working to renew her food stamps but will only receive $20. CM/SW provided resources during last admission however pt reports limited finances and ability to retain stable transportation to access food resources. In addition swelling in BLE have even further limited her mobility.    Though pt is edentulous, she continues to deny difficulty chewing/swallowing.   Meal completions: 8/25: 95% breakfast, 100% lunch, 50% dinner 8/26: 100% breakfast   Pt states that she was weighing about 98 lbs on last admission. She recalls gaining weight and discharging at 115 lbs. Over the last few days she reports a weight loss of about 5 lbs.   EDW: 52 kg Current weight: 54.1 kg  Reviewed weight history. Pt's weight has remained  widely variable throughout the last year likely r/t fluid changes d/t missed HD sessions and changes in edema. Will continue to monitor throughout admission.   Edema: non-pitting BLE  Medications: phoslo, ferrous sulfate, protonix, sodium bicarbonate  Labs: BUN 61, Cr 5.56, corrected calcium 8.72, phos 7.9, GFR 8  UOP: 3.1L x24 hours I/O's: - since admit  NUTRITION - FOCUSED PHYSICAL EXAM:  Flowsheet Row Most Recent Value  Orbital Region Severe depletion  Upper Arm Region Severe depletion  Thoracic and Lumbar Region Severe depletion  Buccal Region Severe depletion  Temple Region Severe depletion  Clavicle Bone Region Severe depletion  Clavicle and Acromion Bone Region Severe depletion  Scapular Bone Region Severe depletion  Dorsal Hand Severe depletion  Patellar Region Severe depletion  Anterior Thigh Region Severe depletion  Posterior Calf Region Severe depletion  Edema (RD Assessment) Mild  [non-pitting BLE]  Hair Reviewed  Eyes Reviewed  Mouth Other (Comment)  [edentulous]  Skin Reviewed  Nails Reviewed       Diet Order:   Diet Order             Diet regular Room service appropriate? Yes; Fluid consistency: Thin  Diet effective now                   EDUCATION NEEDS:   Education needs have been addressed  Skin:  Skin Assessment: Skin Integrity Issues: Skin Integrity Issues:: Stage II Stage II: mid coccyx; L buttock  Last BM:  8/24  Height:   Ht Readings from Last 1 Encounters:  11/10/22 5\' 7"  (1.702 m)  Weight:   Wt Readings from Last 1 Encounters:  11/11/22 54 kg   BMI:  Body mass index is 18.65 kg/m.  Estimated Nutritional Needs:   Kcal:  1700-1900  Protein:  85-100g  Fluid:  1L + UOP  [name], RDN, LDN Clinical Nutrition

## 2022-11-11 NOTE — Progress Notes (Signed)
  Progress Note   Patient: [name] WJX:[id number] DOB: 01/05/1961 DOA: 11/09/2022     1 DOS: the patient was seen and examined on 11/11/2022   Brief hospital course: Mrs. Lingg was admitted to the hospital with the working diagnosis of volume overload, related to ESRD.   62 yo female with the past medical history of ESRD on HD, COPD, heart failure and hyperlipidemia who presented with worsening lower extremity edema. Recent hospitalization 08/09 to 11/02/22 for sepsis due to pneumonia, complicated with renal failure that required renal replacement therapy. After her discharge she has not been compliant with hemodialysis. She developed worsening painful lower extremity edema, prompting her to come back to the hospital.   08/26 HD today with ultrafiltration, goal 3 L.   Assessment and Plan: * End-stage renal disease on hemodialysis (HCC) Hyponatremia.  Volume overload. Patient did not have outpatient HD.  BUN today 61 with K at 4,3 and Na 135.   HD today with ultrafiltration goal 3 L.  Documented urine output is 3,100 ml after furosemide IV 80 mg.   Will need outpatient HD.    Hyper P Add P binders  Non anion gap metabolic acidosis, continue oral bicarbonate.   Anemia of chronic renal disease, sp one unit PRBC transfusion with follow up Hgb 8.1  Iron deficiency anemia, with serum iron 7, TIBC 200, transferrin saturation of 4 and ferritin 187.  Add IV iron.   Acute on chronic diastolic CHF (congestive heart failure) (HCC) Echocardiogram with preserved LV systolic function with EF 55 to 60%, RV systolic function preserved, mild aortic valve stenosis, RVSP 43.1 mmHg.   Volume overload related to ESRD.  Improved volume status, continue ultrafiltration through HD. Ok to discontinue telemetry.    Essential hypertension Continue with amlodipine for blood pressure control. Systolic blood pressure 150 to 160 mmHg.  Continue blood pressure monitoring.   History of COPD No signs  of acute exacerbation Continue with bronchodilator therapy.   Hyperlipidemia Continue with statin therapy.   GAD (generalized anxiety disorder) Resume hydroxyzine for anxiety. Continue with buspirone.  Continue neuro checks per unit protocol.   Pressure injury of skin Present on admission.  Stage 2 pressure ulcer left buttock and coccyx.  Continue local skin care.         Subjective: Patient is having HD at the time of my examination, she is feeling better with improved dyspnea and edema, improved lower extremity pain   Physical Exam: Vitals:   11/11/22 1345 11/11/22 1400 11/11/22 1430 11/11/22 1500  BP: (!) 153/67 (!) 146/68 (!) 154/73 (!) 159/70  Pulse: 79 77 83 80  Resp: 16 13 13 13   Temp:      TempSrc:      SpO2: 98% 96% 97% 96%  Weight:      Height:       Neurology awake and alert ENT with mild pallor Cardiovascular with S1 and S2 present and regular with no gallops or rubs No JVD Trace lower extremity edema Respiratory with no rales or wheezing, no rhonchi Abdomen with no distention  Data Reviewed:    Family Communication: no family at the bedside   Disposition: Status is: Inpatient Remains inpatient appropriate because: needs outpatient HD clip   Planned Discharge Destination: Home     Author: Coralie Keens, MD 11/11/2022 3:27 PM  For on call review [email or web address].

## 2022-11-11 NOTE — Progress Notes (Signed)
Pt's case discussed with nephrologist and renal PA this am. On pt's last admission, pt was re-clipped to [facility] on TTS 11:45 am chair time. Pt has not started at clinic as of yet. Pt had called [facility] last week to request to be transferred to [facility] where pt had received out-pt HD in the past (pt has been d/c from clinic due to not coming to appts). Pt cannot be accepted at [facility] due to clinic being at capacity. Met with pt at bedside while receiving HD today. Pt advised that she cannot be accepted back at [place] and that the [place] [facility] clinic is the closest clinic with availability. Pt states that she has no place to go at d/c and that she does not have transportation services through [facility] (pt states she was advised by agency that she does not have an open application). Pt states that even if she had transportation, she does not have an address to provide to transportation to transport her to/from. Pt requesting assistance with resources and d/c options. Navigator contacted [facility] directly to inquire if pt has an active application. Navigator advised that pt is not in [facility] system therefore pt cannot receive services at d/c. Contacted attending, nephrologist, RN CM, and CSW to make aware of this information and pt's requests for assistance. [facility] contacted to be advised that pt has been re-admitted and will provide any updates as they become available. Will assist as needed.   Olivia Canter Renal Navigator [phone]

## 2022-11-11 NOTE — Progress Notes (Signed)
Heart Failure Navigator Progress Note  Assessed for Heart & Vascular TOC clinic readiness.  Patient does not meet criteria due to ESRD on dialysis.   Navigator will sign off at this time.  Camora Tremain,RN, BSN,MSN Heart Failure Nurse Navigator. Contact by secure chat only.

## 2022-11-11 NOTE — Progress Notes (Signed)
Received patient in bed to unit.  Alert and oriented.  Informed consent signed and in chart.   TX duration:3.5  Patient tolerated well.  Transported back to the room  Alert, without acute distress.  Hand-off given to patient'[name] nurse.   Access used: right [facility] Access issues: none  Total UF removed: 3L Medication([name]) given: none   11/11/22 1705  Vitals  Temp 98.4 F (36.9 C)  Temp Source Oral  BP 126/83  MAP (mmHg) 96  BP Location Left Arm  BP Method Automatic  Patient Position (if appropriate) Lying  Pulse Rate 92  Pulse Rate Source Monitor  ECG Heart Rate 92  Resp 14  Oxygen Therapy  SpO2 94 %  O2 Device Room Air  During Treatment Monitoring  HD Safety Checks Performed Yes  Intra-Hemodialysis Comments Tolerated well  Dialysis Fluid Bolus Normal Saline  Bolus Amount (mL) 300 mL      [name] [name]

## 2022-11-11 NOTE — Progress Notes (Signed)
Called to get nursing report susan hedrick, rn will call back to give report

## 2022-11-11 NOTE — Procedures (Signed)
Seen and examined on dialysis.  Blood pressure 138/68 and HR 85.  Tolerating goal.  RIJ catheter in use.   Estanislado Emms, MD 11/11/2022  4:57 PM

## 2022-11-11 NOTE — Consult Note (Signed)
WOC Nurse Consult Note: Reason for Consult:stage 2 pressure injury to left sacrum Right anterior lower leg, below knee.  She is unclear on etiology but thinks she bumped into something.   *Edema has resolved to lower legs, has scattered bruiding to lower legs.  Wound type:pressure injury Pressure Injury POA: Yes Measurement: 2 cm x 2.5 cm x 0.1 cm  Wound MVH:QION pink and moist Drainage (amount, consistency, odor) scant weeping  no odor Periwound: bruising to lower legs Dressing procedure/placement/frequency: Sacral foam to wound on sacrum.  Change every three days and PRN soilage.  Cleanse wound to right lower leg below knee. Apply medihoney to wound bed.  Cover with gauze and kerlix/tape.  Change daily.  Will not follow at this time.  Please re-consult if needed.  Mike Gip MSN, RN, FNP-BC CWON Wound, Ostomy, Continence Nurse Outpatient [facility] [phone] Pager [phone]

## 2022-11-11 NOTE — Progress Notes (Addendum)
[facility] Progress Note   Subjective:   Seen in room - plan is for dialysis today. Denies dyspnea/CP - room air today. Discussed HD center with renal navigator - the [facility] unit in the closest to her that is accepting patients - her "preferred" HD unit is full and not accepting patients (also she was previously assigned there and never showed up so the had to give her spot away). She has pre-arranged transportation that will take her to the [place] unit for dialysis.  Objective Vitals:   11/10/22 2011 11/11/22 0545 11/11/22 0805 11/11/22 0900  BP: (!) 174/84 (!) 153/59 124/88   Pulse: 87 88 91 85  Resp: 20 19 18 15   Temp: 98.4 F (36.9 C) 97.7 F (36.5 C) 98.2 F (36.8 C)   TempSrc: Oral Oral Oral   SpO2: 100% 95% 94%   Weight:  54.1 kg    Height:       Physical Exam General: Chronically ill appearing woman, NAD. Room air Heart: RRR; no murmur Lungs: Wheezing in upper lobes, bibasilar rales present Abdomen:distended, but soft Extremities: Tense 1+ BLE edema, tender to palpation Dialysis Access: TDC+ RUE AVF + bruit  Additional Objective Labs: Basic Metabolic Panel: Recent Labs  Lab 11/09/22 2126 11/10/22 0326 11/11/22 0325  NA 135 132* 135  K 3.9 3.5 4.3  CL 99 102 102  CO2 16* 18* 18*  GLUCOSE 84 74 87  BUN 59* 56* 61*  CREATININE 5.20* 5.24* 5.56*  CALCIUM 7.5* 7.0* 7.2*  PHOS  --  8.4* 7.9*   Liver Function Tests: Recent Labs  Lab 11/09/22 2126 11/10/22 0326 11/11/22 0325  AST 44* 28  --   ALT 25 21  --   ALKPHOS 97 87  --   BILITOT 0.5 0.6  --   PROT 6.6 5.9*  --   ALBUMIN 2.6* 2.2* 2.1*   CBC: Recent Labs  Lab 11/09/22 2126 11/10/22 0326 11/10/22 1553  WBC 8.2 6.4  --   NEUTROABS 5.5 3.8  --   HGB 7.1* 6.3* 7.3*  HCT 22.5* 19.3* 22.4*  MCV 98.3 97.5  --   PLT 362 332  --    DG Chest 2 View  Result Date: 11/09/2022 CLINICAL DATA:  Cough and shortness of breath. EXAM: CHEST - 2 VIEW COMPARISON:  10/30/2022 FINDINGS:  Right-sided dialysis catheter in place. Increasing right lung base opacity and volume loss, atelectasis/airspace disease and possible effusion. Stable upper normal heart size. Vascular congestion. No pneumothorax. Improved aeration at the left lung base. IMPRESSION: 1. Increasing right lung base opacity and volume loss, likely progression of atelectasis/airspace disease and possible effusion. 2. Improved aeration at the left lung base. 3. Vascular congestion. Electronically Signed   By: Narda Rutherford M.D.   On: 11/09/2022 22:57    Medications:  anticoagulant sodium citrate      amLODipine  10 mg Oral Daily   atorvastatin  10 mg Oral Daily   busPIRone  10 mg Oral BID   calcium acetate  667 mg Oral TID WC   Chlorhexidine Gluconate Cloth  6 each Topical Daily   Chlorhexidine Gluconate Cloth  6 each Topical Q0600   feeding supplement  237 mL Oral BID BM   ferrous sulfate  325 mg Oral Q breakfast   fluticasone furoate-vilanterol  1 puff Inhalation Daily   And   umeclidinium bromide  1 puff Inhalation Daily   gabapentin  100 mg Oral TID   heparin  5,000 Units Subcutaneous Q8H  leptospermum manuka honey  1 Application Topical Daily   pantoprazole  40 mg Oral Daily   sodium bicarbonate  650 mg Oral TID    Dialysis Orders: [facility]  3h  52kg  400/1.5   2/2 bath  RIJ TDC  Heparin none - last OP HD 6/27 - last inpt HD was 11/01/22 -> she never started the [place] clinic - rocaltrol 0.5 mcg po three times per week - venofer 100mg  q hd 8/22- 9/07 - mircera 60 mcg IV q 2 wks, never started  Assessment/Plan: 1. Pulm edema: In setting of not going to HD for > 1 week. She is homeless, but transportation has already been arranged transportation to her assigned HD unit (this has been in place since last discharge - she just didn't utilize it for some reason). HD today - then can likely discharge with plan to "start" outpatient HD 2. ESRD: TTS schedule - hasn't dialyzed in 1 week -> HD today,  then tomorrow to get back on track - hopefully as outpatient. 3. HTN/volume: BP fine, she does have LE edema/pulm edema, UF as tolerated. 4. Anemia of ESRD: Hgb very low - s/p 1U PRBCs 8/25, will give Aranesp today. 5. Secondary hyperparathyroidism: CorrCa ok, Phos high - resume binders.  6. Nutrition: Alb low, continue supplements. 7. Homelessness  Ozzie Hoyle, Cordelia Poche 11/11/2022, 9:37 AM  [facility]   Seen and examined independently.  Agree with note and exam as documented above by physician extender and as noted here.  She states that she has been living on the streets since she lost her room and then she is getting a room back next week.  I encouraged her to go to a homeless shelter but she states that these are "no good because they kick you out at 6am".  Discussed this was at least a safer place to stay overnight.  States she has a procedure planned on her arm/access soon.   General adult female in bed in no acute distress; chronically ill-appearing  HEENT normocephalic atraumatic extraocular movements intact sclera anicteric Neck supple trachea midline Lungs clear to auscultation bilaterally normal work of breathing at rest on room air  Heart S1S2 no rub Abdomen soft nontender nondistended Extremities no edema  Psych she is frustrated which is an understandable response to her current situation  Access RIJ tunn catheter   ESRD on HD - HD today as hasn't had HD in a week.  Then back to TTS schedule and hopefully Tuesday's treatment will be outpatient.  I spoke with HD SW and she does have transportation available for outpatient HD.  She did not attend her HD at the preferred unit and so her chair was reassigned.  She has a spot at [place] which is the next closest to the area that she would like to be.  Appreciate SW.  Difficult situation and as above I have encouraged her to go to a homeless shelter.   Estanislado Emms, MD 11/11/2022 12:43 PM

## 2022-11-12 ENCOUNTER — Telehealth: Payer: Self-pay

## 2022-11-12 DIAGNOSIS — N186 End stage renal disease: Secondary | ICD-10-CM | POA: Diagnosis not present

## 2022-11-12 DIAGNOSIS — I5033 Acute on chronic diastolic (congestive) heart failure: Secondary | ICD-10-CM | POA: Diagnosis not present

## 2022-11-12 DIAGNOSIS — I10 Essential (primary) hypertension: Secondary | ICD-10-CM | POA: Diagnosis not present

## 2022-11-12 DIAGNOSIS — Z8709 Personal history of other diseases of the respiratory system: Secondary | ICD-10-CM | POA: Diagnosis not present

## 2022-11-12 LAB — RENAL FUNCTION PANEL
Albumin: 2.1 g/dL — ABNORMAL LOW (ref 3.5–5.0)
Anion gap: 10 (ref 5–15)
BUN: 32 mg/dL — ABNORMAL HIGH (ref 8–23)
CO2: 25 mmol/L (ref 22–32)
Calcium: 8 mg/dL — ABNORMAL LOW (ref 8.9–10.3)
Chloride: 102 mmol/L (ref 98–111)
Creatinine, Ser: 2.81 mg/dL — ABNORMAL HIGH (ref 0.44–1.00)
GFR, Estimated: 18 mL/min — ABNORMAL LOW (ref >60)
Glucose, Bld: 88 mg/dL (ref 70–99)
Phosphorus: 4.5 mg/dL (ref 2.5–4.6)
Potassium: 3.7 mmol/L (ref 3.5–5.1)
Sodium: 137 mmol/L (ref 135–145)

## 2022-11-12 MED ORDER — SODIUM CHLORIDE 0.9 % IV SOLN
1.5000 g | INTRAVENOUS | Status: AC
  Filled 2022-11-12 (×2): qty 1.5

## 2022-11-12 MED ORDER — ACETAMINOPHEN 500 MG PO TABS
1000.0000 mg | ORAL_TABLET | Freq: Once | ORAL | Status: AC
  Administered 2022-11-13: 1000 mg via ORAL
  Filled 2022-11-12: qty 2

## 2022-11-12 MED ORDER — BISACODYL 5 MG PO TBEC
5.0000 mg | DELAYED_RELEASE_TABLET | Freq: Once | ORAL | Status: AC
  Administered 2022-11-12: 5 mg via ORAL
  Filled 2022-11-12: qty 1

## 2022-11-12 MED ORDER — NEPRO/CARBSTEADY PO LIQD
237.0000 mL | Freq: Three times a day (TID) | ORAL | Status: DC
  Administered 2022-11-12 – 2022-11-24 (×23): 237 mL via ORAL

## 2022-11-12 MED ORDER — TRAZODONE HCL 50 MG PO TABS
50.0000 mg | ORAL_TABLET | Freq: Every evening | ORAL | Status: DC | PRN
  Administered 2022-11-12 – 2022-11-22 (×11): 50 mg via ORAL
  Filled 2022-11-12 (×14): qty 1

## 2022-11-12 NOTE — Plan of Care (Signed)
  Problem: Education: Goal: Knowledge of General Education information will improve Description: Including pain rating scale, medication(s)/side effects and non-pharmacologic comfort measures Outcome: Progressing   Problem: Health Behavior/Discharge Planning: Goal: Ability to manage health-related needs will improve Outcome: Progressing   Problem: Clinical Measurements: Goal: Respiratory complications will improve Outcome: Progressing   Problem: Safety: Goal: Ability to remain free from injury will improve Outcome: Progressing   Problem: Skin Integrity: Goal: Risk for impaired skin integrity will decrease Outcome: Progressing

## 2022-11-12 NOTE — H&P (View-Only) (Signed)
[facility] NOTE   MRN : [id number]  Reason for Consult: ESRD Referring Physician: Nephrology  History of Present Illness: [name] is a 62 y.o. year old female who presents for postoperative follow-up for:   right internal jugular TDC and right brachiobasilic AV fistula creation by Dr. Lenell Antu on 08/14/22.    She is in need of second stage basilic transposition.  She was last seen in our office on 10/01/22 and plans were made to perform second stage on 11/13/22.  She is now in the hospital with worsening edema on 11/10/22.   She is currently dialyzing via a right internal jugular TDC on TTS at the [facility].   Current Facility-Administered Medications  Medication Dose Route Frequency Provider Last Rate Last Admin   (feeding supplement) PROSource Plus liquid 30 mL  30 mL Oral BID BM Julien Nordmann, PA-C   30 mL at 11/12/22 4098   acetaminophen (TYLENOL) tablet 650 mg  650 mg Oral Q6H PRN Howerter, Justin B, DO   650 mg at 11/11/22 1806   Or   acetaminophen (TYLENOL) suppository 650 mg  650 mg Rectal Q6H PRN Howerter, Justin B, DO       albuterol (PROVENTIL) (2.5 MG/3ML) 0.083% nebulizer solution 2.5 mg  2.5 mg Nebulization Q4H PRN Howerter, Justin B, DO   2.5 mg at 11/10/22 2205   amLODipine (NORVASC) tablet 10 mg  10 mg Oral Daily Arrien, York Ram, MD   10 mg at 11/12/22 1191   atorvastatin (LIPITOR) tablet 10 mg  10 mg Oral Daily Howerter, Justin B, DO   10 mg at 11/12/22 4782   bisacodyl (DULCOLAX) EC tablet 5 mg  5 mg Oral Once Arrien, York Ram, MD       busPIRone (BUSPAR) tablet 10 mg  10 mg Oral BID Howerter, Justin B, DO   10 mg at 11/12/22 9562   calcium acetate (PHOSLO) capsule 667 mg  667 mg Oral TID WC Arrien, York Ram, MD   667 mg at 11/12/22 1308   Chlorhexidine Gluconate Cloth 2 % PADS 6 each  6 each Topical Daily Arrien, York Ram, MD   6 each at 11/12/22 [phone]   Chlorhexidine Gluconate Cloth  2 % PADS 6 each  6 each Topical Q0600 Delano Metz, MD   6 each at 11/12/22 0600   Darbepoetin Alfa (ARANESP) injection 100 mcg  100 mcg Subcutaneous Q Mon-1800 Julien Nordmann, PA-C   100 mcg at 11/11/22 1814   feeding supplement (NEPRO CARB STEADY) liquid 237 mL  237 mL Oral TID BM Arrien, York Ram, MD   237 mL at 11/12/22 1036   ferric gluconate (FERRLECIT) 250 mg in sodium chloride 0.9 % 250 mL IVPB  250 mg Intravenous Daily Coralie Keens, MD 135 mL/hr at 11/12/22 1040 250 mg at 11/12/22 1040   fluticasone furoate-vilanterol (BREO ELLIPTA) 100-25 MCG/ACT 1 puff  1 puff Inhalation Daily Howerter, Justin B, DO   1 puff at 11/12/22 4696   And   umeclidinium bromide (INCRUSE ELLIPTA) 62.5 MCG/ACT 1 puff  1 puff Inhalation Daily Howerter, Justin B, DO   1 puff at 11/12/22 0821   gabapentin (NEURONTIN) capsule 100 mg  100 mg Oral TID Antony Madura, PA-C   100 mg at 11/12/22 0823   heparin injection 5,000 Units  5,000 Units Subcutaneous Q8H Howerter, Justin B, DO   5,000 Units at 11/12/22 0629   hydrOXYzine (ATARAX) tablet 25 mg  25  mg Oral TID PRN Arrien, York Ram, MD   25 mg at 11/11/22 0958   leptospermum manuka honey (MEDIHONEY) paste 1 Application  1 Application Topical Daily Arrien, York Ram, MD   1 Application at 11/12/22 0830   lidocaine (XYLOCAINE) 2 % viscous mouth solution 15 mL  15 mL Mouth/Throat Q6H PRN Arrien, York Ram, MD       melatonin tablet 3 mg  3 mg Oral QHS PRN Howerter, Justin B, DO       multivitamin (RENA-VIT) tablet 1 tablet  1 tablet Oral QHS Arrien, York Ram, MD   1 tablet at 11/11/22 2119   nicotine (NICODERM CQ - dosed in mg/24 hours) patch 21 mg  21 mg Transdermal Daily Arrien, York Ram, MD   21 mg at 11/12/22 0826   ondansetron (ZOFRAN) injection 4 mg  4 mg Intravenous Q6H PRN Howerter, Justin B, DO       oxyCODONE-acetaminophen (PERCOCET/ROXICET) 5-325 MG per tablet 1 tablet  1 tablet Oral Q4H PRN Arrien, York Ram, MD   1 tablet at 11/12/22 0842   pantoprazole (PROTONIX) EC tablet 40 mg  40 mg Oral Daily Howerter, Justin B, DO   40 mg at 11/12/22 8416   traZODone (DESYREL) tablet 50 mg  50 mg Oral QHS PRN Opyd, Lavone Neri, MD   50 mg at 11/12/22 0042    Pt meds include: Statin :Yes Betablocker: No ASA: No Other anticoagulants/antiplatelets: none  Past Medical History:  Diagnosis Date   Alcohol abuse    Allergy    Anxiety    Arthritis    Asthma    Cannabis abuse    Cocaine abuse (HCC)    COPD (chronic obstructive pulmonary disease) (HCC)    Depression    Drug addiction (HCC)    GERD (gastroesophageal reflux disease)    Heart murmur    Homelessness    Hyperlipidemia    Hypertension    pt stated "every once in a while BP will be high but has not been prescribed medication for HTN.    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) 02/22/2016    Past Surgical History:  Procedure Laterality Date   AV FISTULA PLACEMENT Right 08/14/2022   Procedure: RIGHT ARM BRACHIOBASILIC ATERIOVENOUS FISTULA CREATION;  Surgeon: Leonie Douglas, MD;  Location: [facility] OR;  Service: Vascular;  Laterality: Right;   BIOPSY  01/01/2019   Procedure: BIOPSY;  Surgeon: Beverley Fiedler, MD;  Location: [facility] ENDOSCOPY;  Service: Endoscopy;;   BIOPSY  11/17/2021   Procedure: BIOPSY;  Surgeon: Lynann Bologna, MD;  Location: [facility] ENDOSCOPY;  Service: Gastroenterology;;   CESAREAN SECTION  1989   COLONOSCOPY  11/07/2020   2018   CYSTOSCOPY W/ URETERAL STENT PLACEMENT Left 08/01/2018   Procedure: CYSTOSCOPY WITH RETROGRADE PYELOGRAM/URETERAL STENT PLACEMENT;  Surgeon: Crista Elliot, MD;  Location: [facility] ORS;  Service: Urology;  Laterality: Left;   CYSTOSCOPY WITH RETROGRADE PYELOGRAM, URETEROSCOPY AND STENT PLACEMENT Left 01/15/2019   Procedure: CYSTOSCOPY WITH RETROGRADE PYELOGRAM, URETEROSCOPY AND  STENT PLACEMENT;  Surgeon: Crista Elliot, MD;  Location: [facility] ORS;  Service: Urology;  Laterality: Left;   ENTEROSCOPY N/A 08/16/2022   Procedure: ENTEROSCOPY;  Surgeon: Meridee Score Netty Starring., MD;  Location: [facility] ENDOSCOPY;  Service: Gastroenterology;  Laterality: N/A;   ENTEROSCOPY N/A 10/31/2022   Procedure: ENTEROSCOPY;  Surgeon:  [name], Carie Caddy, MD;  Location: [facility] ENDOSCOPY;  Service: Gastroenterology;  Laterality: N/A;   ESOPHAGOGASTRODUODENOSCOPY (EGD) WITH PROPOFOL N/A 01/01/2019   Procedure: ESOPHAGOGASTRODUODENOSCOPY (EGD) WITH PROPOFOL;  Surgeon: Beverley Fiedler, MD;  Location: [facility] ENDOSCOPY;  Service: Endoscopy;  Laterality: N/A;   ESOPHAGOGASTRODUODENOSCOPY (EGD) WITH PROPOFOL N/A 08/21/2019   Procedure: ESOPHAGOGASTRODUODENOSCOPY (EGD) WITH PROPOFOL;  Surgeon: Napoleon Form, MD;  Location: [facility] ENDOSCOPY;  Service: Endoscopy;  Laterality: N/A;   ESOPHAGOGASTRODUODENOSCOPY (EGD) WITH PROPOFOL N/A 11/17/2021   Procedure: ESOPHAGOGASTRODUODENOSCOPY (EGD) WITH PROPOFOL;  Surgeon: Lynann Bologna, MD;  Location: [facility] ENDOSCOPY;  Service: Gastroenterology;  Laterality: N/A;   FRACTURE SURGERY Left 2011   arm   HEMOSTASIS CLIP PLACEMENT  08/16/2022   Procedure: HEMOSTASIS CLIP PLACEMENT;  Surgeon: Lemar Lofty., MD;  Location[facility]COPY;  Service: Gastroenterology;;   HOT HEMOSTASIS N/A 08/16/2022   Procedure: HOT HEMOSTASIS (ARGON PLASMA COAGULATION/BICAP);  Surgeon: Lemar Lofty., MD;  Location: [facility] ENDOSCOPY;  Service: Gastroenterology;  Laterality: N/A;   HOT HEMOSTASIS N/A 10/31/2022   Procedure: HOT HEMOSTASIS (ARGON PLASMA COAGULATION/BICAP);  Surgeon: Beverley Fiedler, MD;  Location: [facility] ENDOSCOPY;  Service: Gastroenterology;  Laterality: N/A;   INSERTION OF DIALYSIS CATHETER Right 08/14/2022   Procedure: INSERTION OF TUNNELED DIALYSIS CATHETER USING PALINDROME CATHETER KIT 19CM;  Surgeon: Leonie Douglas, MD;  Location: [facility] OR;  Service: Vascular;  Laterality: Right;    POLYPECTOMY  08/16/2022   Procedure: POLYPECTOMY;  Surgeon: Lemar Lofty., MD;  Location: [facility] ENDOSCOPY;  Service: Gastroenterology;;   SUBMUCOSAL TATTOO INJECTION  08/16/2022   Procedure: SUBMUCOSAL TATTOO INJECTION;  Surgeon: Lemar Lofty., MD;  Location: [facility] ENDOSCOPY;  Service: Gastroenterology;;   UPPER GASTROINTESTINAL ENDOSCOPY      Social History Social History   Tobacco Use   Smoking status: Every Day    Current packs/day: 1.00    Types: Cigarettes    Passive exposure: Past   Smokeless tobacco: Never  Vaping Use   Vaping status: Never Used  Substance Use Topics   Alcohol use: Not Currently    Comment: 1 month PTA   Drug use: Yes    Types: Marijuana    Family History Family History  Problem Relation Age of Onset   Diabetes Father    Colon cancer Neg Hx    Esophageal cancer Neg Hx    Rectal cancer Neg Hx    Stomach cancer Neg Hx     Allergies  Allergen Reactions   Zestril [Lisinopril] Other (See Comments)    Hyperkalemia     REVIEW OF SYSTEMS  General: [ ]  Weight loss, [ ]  Fever, [ ]  chills Neurologic: [ ]  Dizziness, [ ]  Blackouts, [ ]  Seizure [ ]  Stroke, [ ]  "Mini stroke", [ ]  Slurred speech, [ ]  Temporary blindness; [ ]  weakness in arms or legs, [ ]  Hoarseness [ ]  Dysphagia Cardiac: [ ]  Chest pain/pressure, [ ]  Shortness of breath at rest [ ]  Shortness of breath with exertion, [ ]  Atrial fibrillation or irregular heartbeat  Vascular: [ ]  Pain in legs with walking, [ ]  Pain in legs at rest, [ ]  Pain in legs at night,  [ ]  Non-healing ulcer, [ ]  Blood clot in vein/DVT,   Pulmonary: [ ]  Home oxygen, [ ]  Productive cough, [ ]  Coughing up blood, [ ]  Asthma,  [ ]  Wheezing [ ]  COPD Musculoskeletal:  [ ]  Arthritis, [ ]  Low back pain, [ ]  Joint pain Hematologic: [ ]  Easy Bruising, [ ]  Anemia; [ ]   Hepatitis Gastrointestinal: [ ]  Blood in stool, [ ]  Gastroesophageal Reflux/heartburn, Urinary: [ ]  chronic Kidney disease, [ ]  on HD - [ ]  MWF or [ ]   TTHS, [ ]  Burning with urination, [ ]  Difficulty urinating Skin: [ ]  Rashes, [ ]  Wounds Psychological: [ ]  Anxiety, [ ]  Depression  Physical Examination Vitals:   11/11/22 1941 11/11/22 2346 11/12/22 0335 11/12/22 0733  BP: (!) 149/87 (!) 156/76 (!) 145/73 (!) 151/84  Pulse: 90 88 73 96  Resp: 14 17 17 17   Temp: 98.3 F (36.8 C) 98.2 F (36.8 C) 98.5 F (36.9 C) 98.5 F (36.9 C)  TempSrc: Oral Oral Oral Oral  SpO2: 95% 95% 94% 93%  Weight:   51.3 kg   Height:       Body mass index is 17.71 kg/m.  General:  WDWN in NAD  HENT: WNL Eyes: Pupils equal Pulmonary: normal non-labored breathing , without Rales, rhonchi,  wheezing Cardiac: RRR, without  Murmurs, rubs or gallops; No carotid bruits Abdomen: soft, NT, no masses Skin: no rashes, ulcers noted;  no Gangrene , no cellulitis; no open wounds;   Vascular Exam/Pulses:palpable thrill in fistula and radial pulses, grip intact no weakness on the right UE.    Musculoskeletal: no muscle wasting or atrophy; no edema  Neurologic: A&O X 3; Appropriate Affect ;  SENSATION: normal; MOTOR FUNCTION: 5/5 Symmetric Speech is fluent/normal   Significant Diagnostic Studies: CBC Lab Results  Component Value Date   WBC 5.4 11/11/2022   HGB 8.1 (L) 11/11/2022   HCT 24.5 (L) 11/11/2022   MCV 93.9 11/11/2022   PLT 360 11/11/2022    BMET    Component Value Date/Time   NA 137 11/12/2022 0558   NA 137 06/06/2022 1410   K 3.7 11/12/2022 0558   CL 102 11/12/2022 0558   CO2 25 11/12/2022 0558   GLUCOSE 88 11/12/2022 0558   BUN 32 (H) 11/12/2022 0558   BUN 37 (H) 06/06/2022 1410   CREATININE 2.81 (H) 11/12/2022 0558   CREATININE 1.64 (H) 04/29/2016 1047   CALCIUM 8.0 (L) 11/12/2022 0558   CALCIUM 7.9 (L) 08/15/2022 0104   GFRNONAA 18 (L) 11/12/2022 0558   GFRNONAA 35 (L) 04/29/2016 1047   GFRAA 36 (L) 08/31/2019 1230   GFRAA 40 (L) 04/29/2016 1047   Estimated Creatinine Clearance: 16.8 mL/min (A) (by C-G formula based on  SCr of 2.81 mg/dL (H)).  COAG Lab Results  Component Value Date   INR 1.2 11/10/2022   INR 1.1 08/12/2022   INR 1.0 07/31/2018     Non-Invasive Vascular Imaging:  10/01/22 Findings:  +--------------------+----------+-----------------+--------+  AVF                PSV (cm/s)Flow Vol (mL/min)Comments  +--------------------+----------+-----------------+--------+  Native artery inflow   360           732                 +--------------------+----------+-----------------+--------+  AVF Anastomosis        790                               +--------------------+----------+-----------------+--------+     +------------+----------+-------------+----------+-------------------+  OUTFLOW VEINPSV (cm/s)Diameter (cm)Depth (cm)     Describe        +------------+----------+-------------+----------+-------------------+  Prox UA        187        0.56        0.36  partially-occlusive  +------------+----------+-------------+----------+-------------------+  Mid UA         188        0.51        0.51                        +------------+----------+-------------+----------+-------------------+  Dist UA        260        0.47        0.67                        +------------+----------+-------------+----------+-------------------+  AC Fossa       722        0.44        1.19        stenotic        +------------+----------+-------------+----------+-------------------+  2.4 x 1.7 x 4.1cm heterogenoeus structure in the Right AC fossa.  Sonographic findings suggestive of hematoma. Structure is adjacent to AVF  anastomosis site and appears to compress the anastomosis and outflow vein.       Summary:  Arteriovenous fistula-Elevated velocities noted.  Arteriovenous fistula-Thrombus noted.  Arteriovenous fistula-Stenosis noted.    ASSESSMENT/PLAN:  ESRD s/p first stage Basilic AV fistula  Plan pending OR schedule will be for second stage basilic  superficialization with DR. Hawken possibly tomorrow.    Of not the patient is currently homeless and this needs to be done prior to discharge.   Mosetta Pigeon 11/12/2022 10:48 AM  I have interviewed the patient and examined the patient. I agree with the findings by the PA.  She is on the schedule tomorrow for her second stage basilic vein transposition.  Cari Caraway, MD

## 2022-11-12 NOTE — Progress Notes (Addendum)
[facility] NOTE   MRN : [id number]  Reason for Consult: ESRD Referring Physician: Nephrology  History of Present Illness: [name] is a 62 y.o. year old female who presents for postoperative follow-up for:   right internal jugular TDC and right brachiobasilic AV fistula creation by Dr. Lenell Antu on 08/14/22.    She is in need of second stage basilic transposition.  She was last seen in our office on 10/01/22 and plans were made to perform second stage on 11/13/22.  She is now in the hospital with worsening edema on 11/10/22.   She is currently dialyzing via a right internal jugular TDC on TTS at the [facility].   Current Facility-Administered Medications  Medication Dose Route Frequency Provider Last Rate Last Admin   (feeding supplement) PROSource Plus liquid 30 mL  30 mL Oral BID BM Julien Nordmann, PA-C   30 mL at 11/12/22 4098   acetaminophen (TYLENOL) tablet 650 mg  650 mg Oral Q6H PRN Howerter, Justin B, DO   650 mg at 11/11/22 1806   Or   acetaminophen (TYLENOL) suppository 650 mg  650 mg Rectal Q6H PRN Howerter, Justin B, DO       albuterol (PROVENTIL) (2.5 MG/3ML) 0.083% nebulizer solution 2.5 mg  2.5 mg Nebulization Q4H PRN Howerter, Justin B, DO   2.5 mg at 11/10/22 2205   amLODipine (NORVASC) tablet 10 mg  10 mg Oral Daily Arrien, York Ram, MD   10 mg at 11/12/22 1191   atorvastatin (LIPITOR) tablet 10 mg  10 mg Oral Daily Howerter, Justin B, DO   10 mg at 11/12/22 4782   bisacodyl (DULCOLAX) EC tablet 5 mg  5 mg Oral Once Arrien, York Ram, MD       busPIRone (BUSPAR) tablet 10 mg  10 mg Oral BID Howerter, Justin B, DO   10 mg at 11/12/22 9562   calcium acetate (PHOSLO) capsule 667 mg  667 mg Oral TID WC Arrien, York Ram, MD   667 mg at 11/12/22 1308   Chlorhexidine Gluconate Cloth 2 % PADS 6 each  6 each Topical Daily Arrien, York Ram, MD   6 each at 11/12/22 [phone]   Chlorhexidine Gluconate Cloth  2 % PADS 6 each  6 each Topical Q0600 Delano Metz, MD   6 each at 11/12/22 0600   Darbepoetin Alfa (ARANESP) injection 100 mcg  100 mcg Subcutaneous Q Mon-1800 Julien Nordmann, PA-C   100 mcg at 11/11/22 1814   feeding supplement (NEPRO CARB STEADY) liquid 237 mL  237 mL Oral TID BM Arrien, York Ram, MD   237 mL at 11/12/22 1036   ferric gluconate (FERRLECIT) 250 mg in sodium chloride 0.9 % 250 mL IVPB  250 mg Intravenous Daily Coralie Keens, MD 135 mL/hr at 11/12/22 1040 250 mg at 11/12/22 1040   fluticasone furoate-vilanterol (BREO ELLIPTA) 100-25 MCG/ACT 1 puff  1 puff Inhalation Daily Howerter, Justin B, DO   1 puff at 11/12/22 4696   And   umeclidinium bromide (INCRUSE ELLIPTA) 62.5 MCG/ACT 1 puff  1 puff Inhalation Daily Howerter, Justin B, DO   1 puff at 11/12/22 0821   gabapentin (NEURONTIN) capsule 100 mg  100 mg Oral TID Antony Madura, PA-C   100 mg at 11/12/22 0823   heparin injection 5,000 Units  5,000 Units Subcutaneous Q8H Howerter, Justin B, DO   5,000 Units at 11/12/22 0629   hydrOXYzine (ATARAX) tablet 25 mg  25  mg Oral TID PRN Arrien, York Ram, MD   25 mg at 11/11/22 0958   leptospermum manuka honey (MEDIHONEY) paste 1 Application  1 Application Topical Daily Arrien, York Ram, MD   1 Application at 11/12/22 0830   lidocaine (XYLOCAINE) 2 % viscous mouth solution 15 mL  15 mL Mouth/Throat Q6H PRN Arrien, York Ram, MD       melatonin tablet 3 mg  3 mg Oral QHS PRN Howerter, Justin B, DO       multivitamin (RENA-VIT) tablet 1 tablet  1 tablet Oral QHS Arrien, York Ram, MD   1 tablet at 11/11/22 2119   nicotine (NICODERM CQ - dosed in mg/24 hours) patch 21 mg  21 mg Transdermal Daily Arrien, York Ram, MD   21 mg at 11/12/22 0826   ondansetron (ZOFRAN) injection 4 mg  4 mg Intravenous Q6H PRN Howerter, Justin B, DO       oxyCODONE-acetaminophen (PERCOCET/ROXICET) 5-325 MG per tablet 1 tablet  1 tablet Oral Q4H PRN Arrien, York Ram, MD   1 tablet at 11/12/22 0842   pantoprazole (PROTONIX) EC tablet 40 mg  40 mg Oral Daily Howerter, Justin B, DO   40 mg at 11/12/22 8416   traZODone (DESYREL) tablet 50 mg  50 mg Oral QHS PRN Opyd, Lavone Neri, MD   50 mg at 11/12/22 0042    Pt meds include: Statin :Yes Betablocker: No ASA: No Other anticoagulants/antiplatelets: none  Past Medical History:  Diagnosis Date   Alcohol abuse    Allergy    Anxiety    Arthritis    Asthma    Cannabis abuse    Cocaine abuse (HCC)    COPD (chronic obstructive pulmonary disease) (HCC)    Depression    Drug addiction (HCC)    GERD (gastroesophageal reflux disease)    Heart murmur    Homelessness    Hyperlipidemia    Hypertension    pt stated "every once in a while BP will be high but has not been prescribed medication for HTN.    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) 02/22/2016    Past Surgical History:  Procedure Laterality Date   AV FISTULA PLACEMENT Right 08/14/2022   Procedure: RIGHT ARM BRACHIOBASILIC ATERIOVENOUS FISTULA CREATION;  Surgeon: Leonie Douglas, MD;  Location: [facility] OR;  Service: Vascular;  Laterality: Right;   BIOPSY  01/01/2019   Procedure: BIOPSY;  Surgeon: Beverley Fiedler, MD;  Location: [facility] ENDOSCOPY;  Service: Endoscopy;;   BIOPSY  11/17/2021   Procedure: BIOPSY;  Surgeon: Lynann Bologna, MD;  Location: [facility] ENDOSCOPY;  Service: Gastroenterology;;   CESAREAN SECTION  1989   COLONOSCOPY  11/07/2020   2018   CYSTOSCOPY W/ URETERAL STENT PLACEMENT Left 08/01/2018   Procedure: CYSTOSCOPY WITH RETROGRADE PYELOGRAM/URETERAL STENT PLACEMENT;  Surgeon: Crista Elliot, MD;  Location: [facility] ORS;  Service: Urology;  Laterality: Left;   CYSTOSCOPY WITH RETROGRADE PYELOGRAM, URETEROSCOPY AND STENT PLACEMENT Left 01/15/2019   Procedure: CYSTOSCOPY WITH RETROGRADE PYELOGRAM, URETEROSCOPY AND  STENT PLACEMENT;  Surgeon: Crista Elliot, MD;  Location: [facility] ORS;  Service: Urology;  Laterality: Left;   ENTEROSCOPY N/A 08/16/2022   Procedure: ENTEROSCOPY;  Surgeon: Meridee Score Netty Starring., MD;  Location: [facility] ENDOSCOPY;  Service: Gastroenterology;  Laterality: N/A;   ENTEROSCOPY N/A 10/31/2022   Procedure: ENTEROSCOPY;  Surgeon:  [name], Carie Caddy, MD;  Location: [facility] ENDOSCOPY;  Service: Gastroenterology;  Laterality: N/A;   ESOPHAGOGASTRODUODENOSCOPY (EGD) WITH PROPOFOL N/A 01/01/2019   Procedure: ESOPHAGOGASTRODUODENOSCOPY (EGD) WITH PROPOFOL;  Surgeon: Beverley Fiedler, MD;  Location: [facility] ENDOSCOPY;  Service: Endoscopy;  Laterality: N/A;   ESOPHAGOGASTRODUODENOSCOPY (EGD) WITH PROPOFOL N/A 08/21/2019   Procedure: ESOPHAGOGASTRODUODENOSCOPY (EGD) WITH PROPOFOL;  Surgeon: Napoleon Form, MD;  Location: [facility] ENDOSCOPY;  Service: Endoscopy;  Laterality: N/A;   ESOPHAGOGASTRODUODENOSCOPY (EGD) WITH PROPOFOL N/A 11/17/2021   Procedure: ESOPHAGOGASTRODUODENOSCOPY (EGD) WITH PROPOFOL;  Surgeon: Lynann Bologna, MD;  Location: [facility] ENDOSCOPY;  Service: Gastroenterology;  Laterality: N/A;   FRACTURE SURGERY Left 2011   arm   HEMOSTASIS CLIP PLACEMENT  08/16/2022   Procedure: HEMOSTASIS CLIP PLACEMENT;  Surgeon: Lemar Lofty., MD;  Location[facility] ENDOSCOPY;  Service: Gastroenterology;;   HOT HEMOSTASIS N/A 08/16/2022   Procedure: HOT HEMOSTASIS (ARGON PLASMA COAGULATION/BICAP);  Surgeon: Lemar Lofty., MD;  Location: [facility] ENDOSCOPY;  Service: Gastroenterology;  Laterality: N/A;   HOT HEMOSTASIS N/A 10/31/2022   Procedure: HOT HEMOSTASIS (ARGON PLASMA COAGULATION/BICAP);  Surgeon: Beverley Fiedler, MD;  Location: [facility] ENDOSCOPY;  Service: Gastroenterology;  Laterality: N/A;   INSERTION OF DIALYSIS CATHETER Right 08/14/2022   Procedure: INSERTION OF TUNNELED DIALYSIS CATHETER USING PALINDROME CATHETER KIT 19CM;  Surgeon: Leonie Douglas, MD;  Location: [facility] OR;  Service: Vascular;  Laterality: Right;    POLYPECTOMY  08/16/2022   Procedure: POLYPECTOMY;  Surgeon: Lemar Lofty., MD;  Location: [facility] ENDOSCOPY;  Service: Gastroenterology;;   SUBMUCOSAL TATTOO INJECTION  08/16/2022   Procedure: SUBMUCOSAL TATTOO INJECTION;  Surgeon: Lemar Lofty., MD;  Location: [facility] ENDOSCOPY;  Service: Gastroenterology;;   UPPER GASTROINTESTINAL ENDOSCOPY      Social History Social History   Tobacco Use   Smoking status: Every Day    Current packs/day: 1.00    Types: Cigarettes    Passive exposure: Past   Smokeless tobacco: Never  Vaping Use   Vaping status: Never Used  Substance Use Topics   Alcohol use: Not Currently    Comment: 1 month PTA   Drug use: Yes    Types: Marijuana    Family History Family History  Problem Relation Age of Onset   Diabetes Father    Colon cancer Neg Hx    Esophageal cancer Neg Hx    Rectal cancer Neg Hx    Stomach cancer Neg Hx     Allergies  Allergen Reactions   Zestril [Lisinopril] Other (See Comments)    Hyperkalemia     REVIEW OF SYSTEMS  General: [ ]  Weight loss, [ ]  Fever, [ ]  chills Neurologic: [ ]  Dizziness, [ ]  Blackouts, [ ]  Seizure [ ]  Stroke, [ ]  "Mini stroke", [ ]  Slurred speech, [ ]  Temporary blindness; [ ]  weakness in arms or legs, [ ]  Hoarseness [ ]  Dysphagia Cardiac: [ ]  Chest pain/pressure, [ ]  Shortness of breath at rest [ ]  Shortness of breath with exertion, [ ]  Atrial fibrillation or irregular heartbeat  Vascular: [ ]  Pain in legs with walking, [ ]  Pain in legs at rest, [ ]  Pain in legs at night,  [ ]  Non-healing ulcer, [ ]  Blood clot in vein/DVT,   Pulmonary: [ ]  Home oxygen, [ ]  Productive cough, [ ]  Coughing up blood, [ ]  Asthma,  [ ]  Wheezing [ ]  COPD Musculoskeletal:  [ ]  Arthritis, [ ]  Low back pain, [ ]  Joint pain Hematologic: [ ]  Easy Bruising, [ ]  Anemia; [ ]   Hepatitis Gastrointestinal: [ ]  Blood in stool, [ ]  Gastroesophageal Reflux/heartburn, Urinary: [ ]  chronic Kidney disease, [ ]  on HD - [ ]  MWF or [ ]   TTHS, [ ]  Burning with urination, [ ]  Difficulty urinating Skin: [ ]  Rashes, [ ]  Wounds Psychological: [ ]  Anxiety, [ ]  Depression  Physical Examination Vitals:   11/11/22 1941 11/11/22 2346 11/12/22 0335 11/12/22 0733  BP: (!) 149/87 (!) 156/76 (!) 145/73 (!) 151/84  Pulse: 90 88 73 96  Resp: 14 17 17 17   Temp: 98.3 F (36.8 C) 98.2 F (36.8 C) 98.5 F (36.9 C) 98.5 F (36.9 C)  TempSrc: Oral Oral Oral Oral  SpO2: 95% 95% 94% 93%  Weight:   51.3 kg   Height:       Body mass index is 17.71 kg/m.  General:  WDWN in NAD  HENT: WNL Eyes: Pupils equal Pulmonary: normal non-labored breathing , without Rales, rhonchi,  wheezing Cardiac: RRR, without  Murmurs, rubs or gallops; No carotid bruits Abdomen: soft, NT, no masses Skin: no rashes, ulcers noted;  no Gangrene , no cellulitis; no open wounds;   Vascular Exam/Pulses:palpable thrill in fistula and radial pulses, grip intact no weakness on the right UE.    Musculoskeletal: no muscle wasting or atrophy; no edema  Neurologic: A&O X 3; Appropriate Affect ;  SENSATION: normal; MOTOR FUNCTION: 5/5 Symmetric Speech is fluent/normal   Significant Diagnostic Studies: CBC Lab Results  Component Value Date   WBC 5.4 11/11/2022   HGB 8.1 (L) 11/11/2022   HCT 24.5 (L) 11/11/2022   MCV 93.9 11/11/2022   PLT 360 11/11/2022    BMET    Component Value Date/Time   NA 137 11/12/2022 0558   NA 137 06/06/2022 1410   K 3.7 11/12/2022 0558   CL 102 11/12/2022 0558   CO2 25 11/12/2022 0558   GLUCOSE 88 11/12/2022 0558   BUN 32 (H) 11/12/2022 0558   BUN 37 (H) 06/06/2022 1410   CREATININE 2.81 (H) 11/12/2022 0558   CREATININE 1.64 (H) 04/29/2016 1047   CALCIUM 8.0 (L) 11/12/2022 0558   CALCIUM 7.9 (L) 08/15/2022 0104   GFRNONAA 18 (L) 11/12/2022 0558   GFRNONAA 35 (L) 04/29/2016 1047   GFRAA 36 (L) 08/31/2019 1230   GFRAA 40 (L) 04/29/2016 1047   Estimated Creatinine Clearance: 16.8 mL/min (A) (by C-G formula based on  SCr of 2.81 mg/dL (H)).  COAG Lab Results  Component Value Date   INR 1.2 11/10/2022   INR 1.1 08/12/2022   INR 1.0 07/31/2018     Non-Invasive Vascular Imaging:  10/01/22 Findings:  +--------------------+----------+-----------------+--------+  AVF                PSV (cm/s)Flow Vol (mL/min)Comments  +--------------------+----------+-----------------+--------+  Native artery inflow   360           732                 +--------------------+----------+-----------------+--------+  AVF Anastomosis        790                               +--------------------+----------+-----------------+--------+     +------------+----------+-------------+----------+-------------------+  OUTFLOW VEINPSV (cm/s)Diameter (cm)Depth (cm)     Describe        +------------+----------+-------------+----------+-------------------+  Prox UA        187        0.56        0.36  partially-occlusive  +------------+----------+-------------+----------+-------------------+  Mid UA         188        0.51        0.51                        +------------+----------+-------------+----------+-------------------+  Dist UA        260        0.47        0.67                        +------------+----------+-------------+----------+-------------------+  AC Fossa       722        0.44        1.19        stenotic        +------------+----------+-------------+----------+-------------------+  2.4 x 1.7 x 4.1cm heterogenoeus structure in the Right AC fossa.  Sonographic findings suggestive of hematoma. Structure is adjacent to AVF  anastomosis site and appears to compress the anastomosis and outflow vein.       Summary:  Arteriovenous fistula-Elevated velocities noted.  Arteriovenous fistula-Thrombus noted.  Arteriovenous fistula-Stenosis noted.    ASSESSMENT/PLAN:  ESRD s/p first stage Basilic AV fistula  Plan pending OR schedule will be for second stage basilic  superficialization with DR. Hawken possibly tomorrow.    Of not the patient is currently homeless and this needs to be done prior to discharge.   Mosetta Pigeon 11/12/2022 10:48 AM  I have interviewed the patient and examined the patient. I agree with the findings by the PA.  She is on the schedule tomorrow for her second stage basilic vein transposition.  Cari Caraway, MD

## 2022-11-12 NOTE — Evaluation (Signed)
Occupational Therapy Evaluation Patient Details Name: [name] MRN: [id number] DOB: 1960/03/29 Today's Date: 11/12/2022   History of Present Illness Pt is a 62 y/o F presenting to ED on 8.24 with worsening LE edema, admitted for ESRD on HD. recent hospitalization 8/09-8/17 for sepsis 2/2 PNA complicated renal failure that required renal replacement therapy. PMH includes ESRD on HD, COPD, heart failure, HLD   Clinical Impression   Pt reports ind at baseline with ADLs, reports no AD use, just does not mobilize as often when her "legs get swollen". Pt was living in motel and on the street PTA, but reports she does not have a place to stay at d/c. Pt set up - min A for ADLs, mod I for bed mobility. Pt CGA for transfers without AD, frequently reaching out for external support, increased steadiness with use of RW. Pt presenting with impairments listed below, will follow acutely. Anticipate no OT follow up needs at d/c.        If plan is discharge home, recommend the following: A little help with bathing/dressing/bathroom;Assistance with cooking/housework    Functional Status Assessment  Patient has had a recent decline in their functional status and demonstrates the ability to make significant improvements in function in a reasonable and predictable amount of time.  Equipment Recommendations  None recommended by OT    Recommendations for Other Services PT consult     Precautions / Restrictions Precautions Precautions: Fall Restrictions Weight Bearing Restrictions: No      Mobility Bed Mobility Overal bed mobility: Modified Independent                  Transfers Overall transfer level: Needs assistance Equipment used: Rolling walker (2 wheels), None Transfers: Sit to/from Stand Sit to Stand: Contact guard assist                  Balance Overall balance assessment: Needs assistance Sitting-balance support: Bilateral upper extremity supported Sitting  balance-Leahy Scale: Good     Standing balance support: No upper extremity supported Standing balance-Leahy Scale: Good                             ADL either performed or assessed with clinical judgement   ADL Overall ADL's : Needs assistance/impaired Eating/Feeding: Set up;Sitting   Grooming: Contact guard assist;Standing   Upper Body Bathing: Minimal assistance   Lower Body Bathing: Minimal assistance   Upper Body Dressing : Contact guard assist   Lower Body Dressing: Contact guard assist   Toilet Transfer: Contact guard assist   Toileting- Clothing Manipulation and Hygiene: Contact guard assist       Functional mobility during ADLs: Contact guard assist       Vision   Vision Assessment?: No apparent visual deficits Additional Comments: reports her glasses are the wrong prescription     Perception Perception: Not tested       Praxis Praxis: Not tested       Pertinent Vitals/Pain Pain Assessment Pain Assessment: Faces Pain Score: 8  Faces Pain Scale: Hurts whole lot Pain Location: low back and legs Pain Descriptors / Indicators: Discomfort, Grimacing Pain Intervention(s): Limited activity within patient's tolerance, Monitored during session, Repositioned     Extremity/Trunk Assessment Upper Extremity Assessment Upper Extremity Assessment: Generalized weakness   Lower Extremity Assessment Lower Extremity Assessment: Defer to PT evaluation   Cervical / Trunk Assessment Cervical / Trunk Assessment: Other exceptions Cervical / Trunk Exceptions: very  thin   Communication Communication Communication: No apparent difficulties   Cognition Arousal: Alert Behavior During Therapy: Flat affect Overall Cognitive Status: Within Functional Limits for tasks assessed                                 General Comments: aware she is in the hospital, frustrated with continuous return to HD with being unable to access HD and/or housing      General Comments  VSS    Exercises     Shoulder Instructions      Home Living Family/patient expects to be discharged to:: Private residence Living Arrangements: Alone   Type of Home: Homeless (Reports was living in a motel and then was living on the street [place] [employer])                           Additional Comments: difficulty accessing HD due to being homeless      Prior Functioning/Environment Prior Level of Function : Independent/Modified Independent             Mobility Comments: no AD use ADLs Comments: ind with ADLs, things are more difficult due to LE edema        OT Problem List: Decreased strength;Decreased range of motion;Decreased activity tolerance;Impaired balance (sitting and/or standing);Cardiopulmonary status limiting activity      OT Treatment/Interventions: Self-care/ADL training;Therapeutic exercise;Energy conservation;DME and/or AE instruction;Therapeutic activities;Patient/family education;Balance training    OT Goals(Current goals can be found in the care plan section) Acute Rehab OT Goals Patient Stated Goal: none stated OT Goal Formulation: With patient Time For Goal Achievement: 11/26/22 Potential to Achieve Goals: Good ADL Goals Pt Will Perform Upper Body Dressing: with supervision;sitting Pt Will Perform Lower Body Dressing: with supervision;sit to/from stand;sitting/lateral leans Pt Will Transfer to Toilet: with supervision;ambulating;regular height toilet Pt Will Perform Tub/Shower Transfer: with supervision;ambulating;shower seat  OT Frequency: Min 1X/week    Co-evaluation              AM-PAC OT "6 Clicks" Daily Activity     Outcome Measure Help from another person eating meals?: None Help from another person taking care of personal grooming?: None Help from another person toileting, which includes using toliet, bedpan, or urinal?: A Little Help from another person bathing (including washing, rinsing,  drying)?: A Little Help from another person to put on and taking off regular upper body clothing?: A Little Help from another person to put on and taking off regular lower body clothing?: A Little 6 Click Score: 20   End of Session Equipment Utilized During Treatment: Rolling walker (2 wheels)  Activity Tolerance: Patient tolerated treatment well Patient left:    OT Visit Diagnosis: Unsteadiness on feet (R26.81)                Time: 8119-1478 OT Time Calculation (min): 26 min Charges:  OT General Charges $OT Visit: 1 Visit OT Evaluation $OT Eval Low Complexity: 1 Low OT Treatments $Self Care/Home Management : 8-22 mins  Carver Fila, OTD, OTR/L SecureChat Preferred Acute Rehab [phone]   Carver Fila Koonce 11/12/2022, 12:26 PM

## 2022-11-12 NOTE — Progress Notes (Addendum)
[facility] Progress Note   Subjective:   Seen in room - feels better, breathing better. No CP or abd pain. Spoke with renal navigator this AM - turns out that Ms. Anstine doesn't have a phone, so can't arrange her own transportation. Owes money to re-establish this. Cannot find ride to [facility] or medicaid office to request assistance - lots of social issues. She appears that is scheduled for AVF surgery tomorrow (transposition)  -> called [facility] to assess her here to make sure this can be done while she is inpatient.  Objective Vitals:   11/11/22 1941 11/11/22 2346 11/12/22 0335 11/12/22 0733  BP: (!) 149/87 (!) 156/76 (!) 145/73 (!) 151/84  Pulse: 90 88 73 96  Resp: 14 17 17 17   Temp: 98.3 F (36.8 C) 98.2 F (36.8 C) 98.5 F (36.9 C) 98.5 F (36.9 C)  TempSrc: Oral Oral Oral Oral  SpO2: 95% 95% 94% 93%  Weight:   51.3 kg   Height:       Physical Exam General: Better appearing woman, NAD. Room air. 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, deep)  Additional Objective Labs: Basic Metabolic Panel: Recent Labs  Lab 11/10/22 0326 11/11/22 0325 11/12/22 0558  NA 132* 135 137  K 3.5 4.3 3.7  CL 102 102 102  CO2 18* 18* 25  GLUCOSE 74 87 88  BUN 56* 61* 32*  CREATININE 5.24* 5.56* 2.81*  CALCIUM 7.0* 7.2* 8.0*  PHOS 8.4* 7.9* 4.5   Liver Function Tests: Recent Labs  Lab 11/09/22 2126 11/10/22 0326 11/11/22 0325 11/12/22 0558  AST 44* 28  --   --   ALT 25 21  --   --   ALKPHOS 97 87  --   --   BILITOT 0.5 0.6  --   --   PROT 6.6 5.9*  --   --   ALBUMIN 2.6* 2.2* 2.1* 2.1*   CBC: Recent Labs  Lab 11/09/22 2126 11/10/22 0326 11/10/22 1553 11/11/22 1150  WBC 8.2 6.4  --  5.4  NEUTROABS 5.5 3.8  --   --   HGB 7.1* 6.3* 7.3* 8.1*  HCT 22.5* 19.3* 22.4* 24.5*  MCV 98.3 97.5  --  93.9  PLT 362 332  --  360   Medications:   ferric gluconate (FERRLECIT) IVPB Stopped (11/11/22 2205)    (feeding supplement) PROSource Plus  30 mL Oral BID BM   amLODipine  10 mg Oral Daily   atorvastatin  10 mg Oral Daily   busPIRone  10 mg Oral BID   calcium acetate  667 mg Oral TID WC   Chlorhexidine Gluconate Cloth  6 each Topical Daily   Chlorhexidine Gluconate Cloth  6 each Topical Q0600   darbepoetin (ARANESP) injection - DIALYSIS  100 mcg Subcutaneous Q Mon-1800   feeding supplement (NEPRO CARB STEADY)  237 mL Oral TID BM   fluticasone furoate-vilanterol  1 puff Inhalation Daily   And   umeclidinium bromide  1 puff Inhalation Daily   gabapentin  100 mg Oral TID   heparin  5,000 Units Subcutaneous Q8H   leptospermum manuka honey  1 Application Topical Daily   multivitamin  1 tablet Oral QHS   nicotine  21 mg Transdermal Daily   pantoprazole  40 mg Oral Daily   sodium bicarbonate  650 mg Oral TID    Dialysis Orders: [facility]  3h  52kg  400/1.5   2/2 bath  [facility] TDC  Heparin none - last OP HD 6/27 - last inpt HD was 11/01/22 -> she never started the [facility] - rocaltrol 0.5 mcg po three times per week - venofer 100mg  q hd 8/22- 9/07 - mircera 60 mcg IV q 2 wks, never started   Assessment/Plan: 1. Pulm edema: In setting of not going to HD for > 1 week. HD 8/26 - feels better today. 2. ESRD: TTS schedule - HD yesterday, discussed HD again today to get back on track - she prefers to wait until Thursday. D/c PO bicarb. VVS consulted to evaluate if can proceed with 2nd stage BVT tomorrow (was previously scheduled for this to be outpatient surgery - she is hoping to have done while here). 3. HTN/volume: BP decent, LE edema is a little better, lungs sound better. Re-establishing EDW. 4. Anemia of ESRD: Hgb very low on admit. Given 1U PRBCs 8/25. Continue Aranesp q Monday while here. 5. Secondary hyperparathyroidism: Ca/Phos much better - continue binders.  6. Nutrition: Alb low, continue supplements. 7.  Homelessness  Ozzie Hoyle, Cordelia Poche 11/12/2022, 10:22 AM  [facility]   Seen and examined independently.  Agree with note and exam as documented above by physician extender and as noted here.  General adult female in bed in no acute distress; chronically ill-appearing  HEENT normocephalic atraumatic extraocular movements intact sclera anicteric Neck supple trachea midline Lungs clear to auscultation bilaterally normal work of breathing at rest on room air  Heart S1S2 no rub Abdomen soft nontender nondistended Extremities no edema  Psych she is frustrated but appropriate  Access RIJ tunn catheter; RUE AVF bruit and thrill   ESRD - she declines HD today, citing had it yesterday off schedule.  Normally is per TTS schedule   Homelessness - appreciate SW.  I have recommended a homeless shelter and she states that she would rather just sleep behind a building than have to get up at 6am.  Discussed safety issues with that plan   Right AVF in situ - note 2nd stage BVT was previously scheduled for tomorrow and we have called vascular to let them know that she is here.   From a strictly renal standpoint acceptable for discharge tomorrow after vascular procedure    Estanislado Emms, MD 11/12/2022 12:32 PM

## 2022-11-12 NOTE — Progress Notes (Signed)
Progress Note   Patient: [name]:[id number] DOB: Sep 20, 1960 DOA: 11/09/2022     2 DOS: the patient was seen and examined on 11/12/2022   Brief hospital course: [name] was admitted to the hospital with the working diagnosis of volume overload, related to ESRD.   62 yo female with the past medical history of ESRD on HD, COPD, heart failure and hyperlipidemia who presented with worsening lower extremity edema. Recent hospitalization 08/09 to 11/02/22 for sepsis due to pneumonia, complicated with renal failure that required renal replacement therapy. After her discharge she has not been compliant with hemodialysis. She developed worsening painful lower extremity edema, prompting her to come back to the hospital. On her initial physical examination her blood pressure was 146/62, HR 80, RR 16 and 02 saturation 97%, lungs with no wheezing, heart with S1 and S2 present and regular, abdomen with no distention and positive lower extremity edema.   Na 135, K 3,9 Cl 99, bicarbonate 16, glucose 84, bun 59, cr 5,20  BNP 383 High sensitive troponin 23 and 16  Wbc 8,2 hgb 7,1 plt 362   Chest radiograph with right rotation, bilateral interstitial infiltrates, more right than left, right pleural effusion. HD cathter in the internal jugular vein with tip SVC.   EKG 94 bpm, normal axis, normal intervals, sinus rhythm with no significant ST segment or T wave changes.   08/25 hgb down to 6,3 and required one unit PRBC transfusion.  08/26 HD today with ultrafiltration, goal 3 L.  08/27 improved volume status.   Assessment and Plan: * End-stage renal disease on hemodialysis (HCC) Hyponatremia.  Volume overload. Patient did not have outpatient HD.  Post HD BUN is down to 32, K is 3,2 and serum bicarbonate at 25. Volume status has improved, she is down 3 Kg from admission.   Follow up with nephrology for further renal replacement therapy.  Vascular surgery consulted for permanent HD access.    Hyper P Continue with P binders  Anemia of chronic renal disease, sp one unit PRBC transfusion with follow up Hgb 8.1  Iron deficiency anemia, with serum iron 7, TIBC 200, transferrin saturation of 4 and ferritin 187.  SP IV iron, added EPO.  Acute on chronic diastolic CHF (congestive heart failure) (HCC) Echocardiogram with preserved LV systolic function with EF 55 to 60%, RV systolic function preserved, mild aortic valve stenosis, RVSP 43.1 mmHg.   Volume overload related to ESRD.  Improved volume status, continue ultrafiltration through HD. Off telemetry.    Essential hypertension Continue with amlodipine for blood pressure control. Systolic blood pressure 150 to 130 mmHg.  Continue blood pressure monitoring.   History of COPD No signs of acute exacerbation Continue with bronchodilator therapy.   Hyperlipidemia Continue with statin therapy.   GAD (generalized anxiety disorder) Continue with hydroxyzine and buspirone.    Pressure injury of skin Present on admission.  Stage 2 pressure ulcer left buttock and coccyx.  Continue local skin care.         Subjective: Patient with improvement in her dyspnea and edema, no chest pain.  Physical Exam: Vitals:   11/11/22 2346 11/12/22 0335 11/12/22 0733 11/12/22 1153  BP: (!) 156/76 (!) 145/73 (!) 151/84 (!) 130/57  Pulse: 88 73 96 78  Resp: 17 17 17 15   Temp: 98.2 F (36.8 C) 98.5 F (36.9 C) 98.5 F (36.9 C) 98.6 F (37 C)  TempSrc: Oral Oral Oral Oral  SpO2: 95% 94% 93% 90%  Weight:  51.3 kg  Height:        Neurology awake and alert ENT with mild pallor Cardiovascular with S1 and S2 present and regular Respiratory with no rales or wheezing Abdomen with no distention  Trace lower extremity edema  Data Reviewed:    Family Communication: no family at the bedside   Disposition: Status is: Inpatient Remains inpatient appropriate because: renal replacement and vascular access.   Planned Discharge  Destination: Home      Author: Coralie Keens, MD 11/12/2022 1:38 PM  For on call review [email or web address].

## 2022-11-12 NOTE — Telephone Encounter (Signed)
Per Lowry Ram., received a call from [place], [place]. Patient is scheduled for surgery tomorrow with Dr. Lenell Antu but wanted to let us know that the pt is already admitted to the hospital, just in case the PAs want to go check on her today. Informed Dr. Lenell Antu of this information. Dr. Lenell Antu reviewed chart and advised to cancel surgery for now and that nephrology can consult vascular if needed.   Relayed information to K. Stovall and she requested to consult vascular services to evaluate patient. Consult placed.

## 2022-11-12 NOTE — Progress Notes (Signed)
Physical Therapy Treatment Patient Details Name: [name] MRN: [id number] DOB: 08/02/1960 Today's Date: 11/12/2022   History of Present Illness 62 yo female presents to [facility] on 8/24 with LE swelling, redness due to HF exacerbation and volume overload from missed HD. Recent d/c from [facility] on 8/17 for COPD exacerbation, bilat PNA. PMH includes ESRD with noncompliance, DMII, COPD, PAD, GERD, GIB, AVM, anemia, polysubstance abuse, HLD, hep C, tobacco use disorder and homelessness.    PT Comments  Pt presents with generalized weakness, impaired balance, poor activity tolerance. Pt to benefit from acute PT to address deficits. Pt ambulated short hallway distance requiring PT assist to correct imbalance x2, cites the reason as not having been up yet although she got up with OT earlier today. Pt expresses to PT she is struggling with living in a motel as she keeps getting kicked out when she doesn't have enough funds to pay, states she cannot live with her daughters but one of her daughters has a Electronics engineer which she could possibly stay in, passed along to SW. PT to progress mobility as tolerated, and will continue to follow acutely.      If plan is discharge home, recommend the following: Assist for transportation;Help with stairs or ramp for entrance;A little help with walking and/or transfers   Can travel by private vehicle        Equipment Recommendations  None recommended by PT    Recommendations for Other Services       Precautions / Restrictions Precautions Precautions: Fall Restrictions Weight Bearing Restrictions: No     Mobility  Bed Mobility Overal bed mobility: Modified Independent                  Transfers Overall transfer level: Needs assistance Equipment used: None Transfers: Sit to/from Stand Sit to Stand: Contact guard assist           General transfer comment: slow to rise    Ambulation/Gait Ambulation/Gait assistance: Min assist Gait Distance (Feet):  75 Feet Assistive device: 1 person hand held assist Gait Pattern/deviations: Step-through pattern, Decreased stride length, Staggering left, Staggering right, Scissoring Gait velocity: decr     General Gait Details: slowed with weaving of gait, steadying assist to correct near-scissoring and veering. SpO2 91% on RA, HR 107 bpm immediately post-gait   Stairs             Wheelchair Mobility     Tilt Bed    Modified Rankin (Stroke Patients Only)       Balance Overall balance assessment: Needs assistance Sitting-balance support: Bilateral upper extremity supported Sitting balance-Leahy Scale: Good     Standing balance support: No upper extremity supported Standing balance-Leahy Scale: Poor Standing balance comment: reliant on PT assist and environment                            Cognition Arousal: Alert Behavior During Therapy: Flat affect Overall Cognitive Status: Within Functional Limits for tasks assessed                                 General Comments: aware she is in the hospital, frustrated with continuous return to HD with being unable to access HD and/or housing. Tangential in conversation        Exercises      General Comments General comments (skin integrity, edema, etc.): VSS  Pertinent Vitals/Pain Pain Assessment Pain Assessment: Faces Faces Pain Scale: Hurts little more Pain Location: low back and legs Pain Descriptors / Indicators: Discomfort, Grimacing Pain Intervention(s): Limited activity within patient's tolerance, Monitored during session, Repositioned    Home Living Family/patient expects to be discharged to:: Private residence Living Arrangements: Alone   Type of Home: Homeless (Reports was living in a motel and then was living on the [address])             Additional Comments: difficulty accessing HD due to being homeless    Prior Function            PT Goals (current goals can  now be found in the care plan section) Acute Rehab PT Goals PT Goal Formulation: With patient Time For Goal Achievement: 11/26/22 Potential to Achieve Goals: Good    Frequency    Min 1X/week      PT Plan      Co-evaluation              AM-PAC PT "6 Clicks" Mobility   Outcome Measure  Help needed turning from your back to your side while in a flat bed without using bedrails?: A Little Help needed moving from lying on your back to sitting on the side of a flat bed without using bedrails?: A Little Help needed moving to and from a bed to a chair (including a wheelchair)?: A Little Help needed standing up from a chair using your arms (e.g., wheelchair or bedside chair)?: A Little Help needed to walk in hospital room?: A Little Help needed climbing 3-5 steps with a railing? : A Little 6 Click Score: 18    End of Session   Activity Tolerance: Patient limited by fatigue Patient left: in bed;with call bell/phone within reach;with bed alarm set Nurse Communication: Mobility status PT Visit Diagnosis: Muscle weakness (generalized) (M62.81)     Time: 1191-4782 PT Time Calculation (min) (ACUTE ONLY): 24 min  Charges:      PT General Charges $$ ACUTE PT VISIT: 1 Visit                     Marye Round, PT DPT Acute Rehabilitation Services Secure Chat Preferred  Office [phone]    Keante Urizar Sheliah Plane 11/12/2022, 10:39 AM

## 2022-11-13 ENCOUNTER — Ambulatory Visit (HOSPITAL_COMMUNITY): Admission: RE | Admit: 2022-11-13 | Payer: Medicaid Other | Source: Home / Self Care | Admitting: Vascular Surgery

## 2022-11-13 ENCOUNTER — Encounter (HOSPITAL_COMMUNITY): Admission: RE | Payer: Self-pay | Source: Home / Self Care

## 2022-11-13 ENCOUNTER — Inpatient Hospital Stay (HOSPITAL_COMMUNITY): Payer: Medicare Other | Admitting: Anesthesiology

## 2022-11-13 ENCOUNTER — Encounter (HOSPITAL_COMMUNITY)
Admission: EM | Disposition: A | Discharge status: Home / Self Care | Payer: Self-pay | Source: Home / Self Care | Attending: Internal Medicine

## 2022-11-13 ENCOUNTER — Other Ambulatory Visit: Payer: Self-pay

## 2022-11-13 ENCOUNTER — Encounter (HOSPITAL_COMMUNITY): Payer: Self-pay | Admitting: Internal Medicine

## 2022-11-13 ENCOUNTER — Ambulatory Visit (HOSPITAL_COMMUNITY): Admission: RE | Admit: 2022-11-13 | Payer: Medicaid Other | Source: Ambulatory Visit | Admitting: Vascular Surgery

## 2022-11-13 DIAGNOSIS — Z992 Dependence on renal dialysis: Secondary | ICD-10-CM

## 2022-11-13 DIAGNOSIS — I5032 Chronic diastolic (congestive) heart failure: Secondary | ICD-10-CM

## 2022-11-13 DIAGNOSIS — I132 Hypertensive heart and chronic kidney disease with heart failure and with stage 5 chronic kidney disease, or end stage renal disease: Secondary | ICD-10-CM

## 2022-11-13 DIAGNOSIS — N186 End stage renal disease: Secondary | ICD-10-CM | POA: Diagnosis not present

## 2022-11-13 DIAGNOSIS — E1122 Type 2 diabetes mellitus with diabetic chronic kidney disease: Secondary | ICD-10-CM

## 2022-11-13 DIAGNOSIS — N185 Chronic kidney disease, stage 5: Secondary | ICD-10-CM

## 2022-11-13 HISTORY — PX: BASCILIC VEIN TRANSPOSITION: SHX5742

## 2022-11-13 LAB — RENAL FUNCTION PANEL
Albumin: 2.1 g/dL — ABNORMAL LOW (ref 3.5–5.0)
Anion gap: 9 (ref 5–15)
BUN: 48 mg/dL — ABNORMAL HIGH (ref 8–23)
CO2: 22 mmol/L (ref 22–32)
Calcium: 8.5 mg/dL — ABNORMAL LOW (ref 8.9–10.3)
Chloride: 104 mmol/L (ref 98–111)
Creatinine, Ser: 3.7 mg/dL — ABNORMAL HIGH (ref 0.44–1.00)
GFR, Estimated: 13 mL/min — ABNORMAL LOW (ref >60)
Glucose, Bld: 94 mg/dL (ref 70–99)
Phosphorus: 4.5 mg/dL (ref 2.5–4.6)
Potassium: 4.1 mmol/L (ref 3.5–5.1)
Sodium: 135 mmol/L (ref 135–145)

## 2022-11-13 LAB — GLUCOSE, CAPILLARY: Glucose-Capillary: 85 mg/dL (ref 70–99)

## 2022-11-13 SURGERY — TRANSPOSITION, VEIN, BASILIC
Anesthesia: Monitor Anesthesia Care | Laterality: Right

## 2022-11-13 SURGERY — TRANSPOSITION, VEIN, BASILIC
Anesthesia: General | Laterality: Right

## 2022-11-13 MED ORDER — ORAL CARE MOUTH RINSE: 15.0000 mL | Freq: Once | OROMUCOSAL | Status: AC

## 2022-11-13 MED ORDER — HEPARIN 6000 UNIT IRRIGATION SOLUTION
Status: AC
  Filled 2022-11-13: qty 500

## 2022-11-13 MED ORDER — SODIUM CHLORIDE 0.9 % IV SOLN: INTRAVENOUS | Status: DC

## 2022-11-13 MED ORDER — PHENYLEPHRINE 80 MCG/ML (10ML) SYRINGE FOR IV PUSH (FOR BLOOD PRESSURE SUPPORT)
PREFILLED_SYRINGE | INTRAVENOUS | Status: AC
  Filled 2022-11-13: qty 10

## 2022-11-13 MED ORDER — CHLORHEXIDINE GLUCONATE 0.12 % MT SOLN: 15.0000 mL | Freq: Once | OROMUCOSAL | Status: AC

## 2022-11-13 MED ORDER — MIDAZOLAM HCL 2 MG/2ML IJ SOLN
INTRAMUSCULAR | Status: AC
  Filled 2022-11-13: qty 2

## 2022-11-13 MED ORDER — FENTANYL CITRATE (PF) 250 MCG/5ML IJ SOLN
INTRAMUSCULAR | Status: AC
  Filled 2022-11-13: qty 5

## 2022-11-13 MED ORDER — FENTANYL CITRATE (PF) 100 MCG/2ML IJ SOLN
INTRAMUSCULAR | Status: AC
  Filled 2022-11-13: qty 2

## 2022-11-13 MED ORDER — ONDANSETRON HCL 4 MG/2ML IJ SOLN: 4.0000 mg | Freq: Once | INTRAMUSCULAR | Status: DC | PRN

## 2022-11-13 MED ORDER — CEFAZOLIN SODIUM-DEXTROSE 2-4 GM/100ML-% IV SOLN
2.0000 g | INTRAVENOUS | Status: AC
  Administered 2022-11-13: 2 g via INTRAVENOUS
  Filled 2022-11-13: qty 100

## 2022-11-13 MED ORDER — CEFAZOLIN SODIUM-DEXTROSE 2-4 GM/100ML-% IV SOLN
INTRAVENOUS | Status: AC
  Filled 2022-11-13: qty 100

## 2022-11-13 MED ORDER — 0.9 % SODIUM CHLORIDE (POUR BTL) OPTIME
TOPICAL | Status: DC | PRN
  Administered 2022-11-13: 1000 mL

## 2022-11-13 MED ORDER — ONDANSETRON HCL 4 MG/2ML IJ SOLN
INTRAMUSCULAR | Status: DC | PRN
  Administered 2022-11-13: 4 mg via INTRAVENOUS

## 2022-11-13 MED ORDER — ONDANSETRON HCL 4 MG/2ML IJ SOLN
INTRAMUSCULAR | Status: AC
  Filled 2022-11-13: qty 2

## 2022-11-13 MED ORDER — LIDOCAINE 2% (20 MG/ML) 5 ML SYRINGE
INTRAMUSCULAR | Status: AC
  Filled 2022-11-13: qty 5

## 2022-11-13 MED ORDER — HEPARIN SODIUM (PORCINE) 1000 UNIT/ML IJ SOLN
1.6000 mL | Freq: Once | INTRAMUSCULAR | Status: AC
  Administered 2022-11-13: 1600 [IU]
  Filled 2022-11-13: qty 1.6

## 2022-11-13 MED ORDER — FENTANYL CITRATE (PF) 100 MCG/2ML IJ SOLN
25.0000 ug | INTRAMUSCULAR | Status: DC | PRN
  Administered 2022-11-13 (×2): 25 ug via INTRAVENOUS

## 2022-11-13 MED ORDER — LIDOCAINE 2% (20 MG/ML) 5 ML SYRINGE
INTRAMUSCULAR | Status: DC | PRN
  Administered 2022-11-13: 60 mg via INTRAVENOUS

## 2022-11-13 MED ORDER — CHLORHEXIDINE GLUCONATE 4 % EX SOLN: 60.0000 mL | Freq: Once | CUTANEOUS | Status: DC

## 2022-11-13 MED ORDER — LIDOCAINE-EPINEPHRINE (PF) 1 %-1:200000 IJ SOLN
INTRAMUSCULAR | Status: AC
  Filled 2022-11-13: qty 30

## 2022-11-13 MED ORDER — PHENYLEPHRINE 80 MCG/ML (10ML) SYRINGE FOR IV PUSH (FOR BLOOD PRESSURE SUPPORT)
PREFILLED_SYRINGE | INTRAVENOUS | Status: DC | PRN
  Administered 2022-11-13: 40 ug via INTRAVENOUS

## 2022-11-13 MED ORDER — CHLORHEXIDINE GLUCONATE 0.12 % MT SOLN
OROMUCOSAL | Status: AC
  Administered 2022-11-13: 15 mL via OROMUCOSAL
  Filled 2022-11-13: qty 15

## 2022-11-13 MED ORDER — MIDAZOLAM HCL 2 MG/2ML IJ SOLN
INTRAMUSCULAR | Status: DC | PRN
  Administered 2022-11-13: 1 mg via INTRAVENOUS

## 2022-11-13 MED ORDER — FENTANYL CITRATE (PF) 250 MCG/5ML IJ SOLN
INTRAMUSCULAR | Status: DC | PRN
  Administered 2022-11-13 (×3): 25 ug via INTRAVENOUS
  Administered 2022-11-13: 50 ug via INTRAVENOUS
  Administered 2022-11-13: 25 ug via INTRAVENOUS

## 2022-11-13 MED ORDER — PROPOFOL 10 MG/ML IV BOLUS
INTRAVENOUS | Status: AC
  Filled 2022-11-13: qty 20

## 2022-11-13 MED ORDER — LIDOCAINE HCL (PF) 1 % IJ SOLN
INTRAMUSCULAR | Status: AC
  Filled 2022-11-13: qty 30

## 2022-11-13 MED ORDER — HEPARIN 6000 UNIT IRRIGATION SOLUTION
Status: DC | PRN
  Administered 2022-11-13: 1

## 2022-11-13 MED ORDER — CHLORHEXIDINE GLUCONATE 4 % EX SOLN
60.0000 mL | Freq: Once | CUTANEOUS | Status: AC
  Administered 2022-11-13: 4 via TOPICAL
  Filled 2022-11-13: qty 15

## 2022-11-13 SURGICAL SUPPLY — 40 items
ADH SKN CLS APL DERMABOND .7 (GAUZE/BANDAGES/DRESSINGS) ×2
APL PRP STRL LF DISP 70% ISPRP (MISCELLANEOUS) ×1
APL SKNCLS STERI-STRIP NONHPOA (GAUZE/BANDAGES/DRESSINGS) ×1
ARMBAND PINK RESTRICT EXTREMIT (MISCELLANEOUS) ×1 IMPLANT
BAG COUNTER SPONGE SURGICOUNT (BAG) ×1 IMPLANT
BAG SPNG CNTER NS LX DISP (BAG) ×1
BENZOIN TINCTURE PRP APPL 2/3 (GAUZE/BANDAGES/DRESSINGS) ×1 IMPLANT
BNDG CMPR 5X4 CHSV STRCH STRL (GAUZE/BANDAGES/DRESSINGS) ×1
BNDG COHESIVE 4X5 TAN STRL LF (GAUZE/BANDAGES/DRESSINGS) IMPLANT
CANISTER SUCT 3000ML PPV (MISCELLANEOUS) ×1 IMPLANT
CHLORAPREP W/TINT 26 (MISCELLANEOUS) ×1 IMPLANT
CLIP LIGATING EXTRA MED SLVR (CLIP) ×1 IMPLANT
CLIP LIGATING EXTRA SM BLUE (MISCELLANEOUS) ×1 IMPLANT
CLSR STERI-STRIP ANTIMIC 1/2X4 (GAUZE/BANDAGES/DRESSINGS) IMPLANT
COVER PROBE W GEL 5X96 (DRAPES) ×1 IMPLANT
DERMABOND ADVANCED .7 DNX12 (GAUZE/BANDAGES/DRESSINGS) IMPLANT
ELECT REM PT RETURN 9FT ADLT (ELECTROSURGICAL) ×1
ELECTRODE REM PT RTRN 9FT ADLT (ELECTROSURGICAL) ×1 IMPLANT
GAUZE SPONGE 2X2 STRL 8-PLY (GAUZE/BANDAGES/DRESSINGS) IMPLANT
GLOVE BIO SURGEON STRL SZ8 (GLOVE) ×1 IMPLANT
GOWN STRL REUS W/ TWL LRG LVL3 (GOWN DISPOSABLE) ×2 IMPLANT
GOWN STRL REUS W/ TWL XL LVL3 (GOWN DISPOSABLE) ×1 IMPLANT
GOWN STRL REUS W/TWL LRG LVL3 (GOWN DISPOSABLE) ×2
GOWN STRL REUS W/TWL XL LVL3 (GOWN DISPOSABLE) ×1
KIT BASIN OR (CUSTOM PROCEDURE TRAY) ×1 IMPLANT
KIT TURNOVER KIT B (KITS) ×1 IMPLANT
NS IRRIG 1000ML POUR BTL (IV SOLUTION) ×1 IMPLANT
PACK CV ACCESS (CUSTOM PROCEDURE TRAY) ×1 IMPLANT
PAD ARMBOARD 7.5X6 YLW CONV (MISCELLANEOUS) ×2 IMPLANT
SLING ARM FOAM STRAP LRG (SOFTGOODS) IMPLANT
SLING ARM FOAM STRAP MED (SOFTGOODS) IMPLANT
STRIP CLOSURE SKIN 1/2X4 (GAUZE/BANDAGES/DRESSINGS) ×2 IMPLANT
SUT MNCRL AB 4-0 PS2 18 (SUTURE) ×1 IMPLANT
SUT PROLENE 6 0 BV (SUTURE) ×1 IMPLANT
SUT SILK 2 0 SH (SUTURE) IMPLANT
SUT VIC AB 3-0 SH 27 (SUTURE) ×2
SUT VIC AB 3-0 SH 27X BRD (SUTURE) ×1 IMPLANT
TOWEL GREEN STERILE (TOWEL DISPOSABLE) ×1 IMPLANT
UNDERPAD 30X36 HEAVY ABSORB (UNDERPADS AND DIAPERS) ×1 IMPLANT
WATER STERILE IRR 1000ML POUR (IV SOLUTION) ×1 IMPLANT

## 2022-11-13 NOTE — Progress Notes (Signed)
Patient off the floor for procedure.

## 2022-11-13 NOTE — Interval H&P Note (Signed)
History and Physical Interval Note:  11/13/2022 7:45 AM  [name]  has presented today for surgery, with the diagnosis of End Stage Renal Disease.  The various methods of treatment have been discussed with the patient and family. After consideration of risks, benefits and other options for treatment, the patient has consented to  Procedure(s): RIGHT ARM SECOND STAGE BASILIC VEIN TRANSPOSITION (Right) as a surgical intervention.  The patient's history has been reviewed, patient examined, no change in status, stable for surgery.  I have reviewed the patient's chart and labs.  Questions were answered to the patient's satisfaction.     Leonie Douglas

## 2022-11-13 NOTE — OR Nursing (Signed)
Removed silver ankle bracelet and found silver necklace in bed of patient. The 2 items were bagged and attached to chart and sent with patient to short stay

## 2022-11-13 NOTE — Progress Notes (Signed)
PROGRESS NOTE    [name]  NFA:[id number] DOB: 11-19-1960 DOA: 11/09/2022 PCP: [name], MD  C2/F with ESRD on hemodialysis, chronic diastolic CHF, COPD, dyslipidemia presented to the ED with worsening lower extremity edema. -Recent admission 8/9-8/17 for sepsis from pneumonia complicated by renal failure, eventually required hemodialysis, since discharge was noncompliant with HD. -Volume overloaded on admission, with creatinine of 5.2, BUN 59, BNP 3 3, troponin 23, hemoglobin 7.1, chest x-ray noted bilateral interstitial infiltrates, right pleural effusion -8/23 hemoglobin down to 6.3, transfuse 1 unit PRBC 8/26 HD with ultrafiltration, 3 L   Subjective: Back from OR, sp AVF, has pain at site   Assessment and Plan: End-stage renal disease on hemodialysis (HCC) Volume overload, noncompliance, hyponatremia -Did not do outpatient HD X 10 days prior to admission -Volume status improving -Vascular surgery consulted for AV fistula, second step  Anemia of chronic renal disease,  -sp one unit PRBC transfusion  Iron deficiency anemia, with serum iron 7, TIBC 200, transferrin saturation of 4 and ferritin 187.  SP IV iron, added EPO. -CBC in am  Acute on chronic diastolic CHF (congestive heart failure) (HCC) Echocardiogram with preserved LV systolic function with EF 55 to 60%, RV systolic function preserved, mild aortic valve stenosis, RVSP 43.1 mmHg.  -Volume management with HD  Essential hypertension -Improving with HD, continue amlodipine  History of COPD Stable Continue with bronchodilator therapy.   Hyperlipidemia Continue with statin therapy.   GAD (generalized anxiety disorder) Continue with hydroxyzine and buspirone.   Homelessness  Pressure injury of skin Present on admission.  Stage 2 pressure ulcer left buttock and coccyx.  Continue local skin care.   DVT prophylaxis: heparin SQ Code Status: Full COde Family Communication: None  present Disposition Plan: Homeless  Consultants:    Procedures:   Antimicrobials:    Objective: Vitals:   11/13/22 1015 11/13/22 1030 11/13/22 1045 11/13/22 1117  BP: (!) 164/73 (!) 173/88 (!) 167/65 125/86  Pulse: 88 92 81 88  Resp: 14 16 12 18   Temp:   98 F (36.7 C) 98.3 F (36.8 C)  TempSrc:    Oral  SpO2: 95% 95% 95% 95%  Weight:      Height:        Intake/Output Summary (Last 24 hours) at 11/13/2022 1222 Last data filed at 11/13/2022 1201 Gross per 24 hour  Intake 660 ml  Output 2102 ml  Net -1442 ml   Filed Weights   11/11/22 1317 11/11/22 1712 11/12/22 0335  Weight: 54 kg 50.1 kg 51.3 kg    Examination:  Gen: Awake, Alert, Oriented X 3,  HEENT: R internal jugular HD cath Lungs: Good air movement bilaterally, CTAB CVS: S1S2/RRR Abd: soft, Non tender, non distended, BS present Extremities: trace edema Skin: chronic skin changes   Data Reviewed:   CBC: Recent Labs  Lab 11/09/22 2126 11/10/22 0326 11/10/22 1553 11/11/22 1150  WBC 8.2 6.4  --  5.4  NEUTROABS 5.5 3.8  --   --   HGB 7.1* 6.3* 7.3* 8.1*  HCT 22.5* 19.3* 22.4* 24.5*  MCV 98.3 97.5  --  93.9  PLT 362 332  --  360   Basic Metabolic Panel: Recent Labs  Lab 11/09/22 2126 11/10/22 0326 11/11/22 0325 11/12/22 0558 11/13/22 0403  NA 135 132* 135 137 135  K 3.9 3.5 4.3 3.7 4.1  CL 99 102 102 102 104  CO2 16* 18* 18* 25 22  GLUCOSE 84 74 87 88 94  BUN 59* 56*  61* 32* 48*  CREATININE 5.20* 5.24* 5.56* 2.81* 3.70*  CALCIUM 7.5* 7.0* 7.2* 8.0* 8.5*  MG 1.9 1.8  --   --   --   PHOS  --  8.4* 7.9* 4.5 4.5   GFR: Estimated Creatinine Clearance: 12.8 mL/min (A) (by C-G formula based on SCr of 3.7 mg/dL (H)). Liver Function Tests: Recent Labs  Lab 11/09/22 2126 11/10/22 0326 11/11/22 0325 11/12/22 0558 11/13/22 0403  AST 44* 28  --   --   --   ALT 25 21  --   --   --   ALKPHOS 97 87  --   --   --   BILITOT 0.5 0.6  --   --   --   PROT 6.6 5.9*  --   --   --   ALBUMIN 2.6*  2.2* 2.1* 2.1* 2.1*   No results for input(s): "LIPASE", "AMYLASE" in the last 168 hours. No results for input(s): "AMMONIA" in the last 168 hours. Coagulation Profile: Recent Labs  Lab 11/10/22 0326  INR 1.2   Cardiac Enzymes: No results for input(s): "CKTOTAL", "CKMB", "CKMBINDEX", "TROPONINI" in the last 168 hours. BNP (last 3 results) No results for input(s): "PROBNP" in the last 8760 hours. HbA1C: No results for input(s): "HGBA1C" in the last 72 hours. CBG: Recent Labs  Lab 11/10/22 0145 11/13/22 0658  GLUCAP 106* 85   Lipid Profile: No results for input(s): "CHOL", "HDL", "LDLCALC", "TRIG", "CHOLHDL", "LDLDIRECT" in the last 72 hours. Thyroid Function Tests: No results for input(s): "TSH", "T4TOTAL", "FREET4", "T3FREE", "THYROIDAB" in the last 72 hours. Anemia Panel: No results for input(s): "VITAMINB12", "FOLATE", "FERRITIN", "TIBC", "IRON", "RETICCTPCT" in the last 72 hours. Urine analysis:    Component Value Date/Time   COLORURINE YELLOW 10/25/2022 2015   APPEARANCEUR HAZY (A) 10/25/2022 2015   LABSPEC 1.009 10/25/2022 2015   PHURINE 5.0 10/25/2022 2015   GLUCOSEU 50 (A) 10/25/2022 2015   HGBUR LARGE (A) 10/25/2022 2015   BILIRUBINUR NEGATIVE 10/25/2022 2015   KETONESUR NEGATIVE 10/25/2022 2015   PROTEINUR 100 (A) 10/25/2022 2015   UROBILINOGEN 0.2 03/02/2010 2027   NITRITE POSITIVE (A) 10/25/2022 2015   LEUKOCYTESUR MODERATE (A) 10/25/2022 2015   Sepsis Labs: @LABRCNTIP (procalcitonin:4,lacticidven:4)  )No results found for this or any previous visit (from the past 240 hour(s)).   Radiology Studies: No results found.   Scheduled Meds:  (feeding supplement) PROSource Plus  30 mL Oral BID BM   amLODipine  10 mg Oral Daily   atorvastatin  10 mg Oral Daily   busPIRone  10 mg Oral BID   calcium acetate  667 mg Oral TID WC   Chlorhexidine Gluconate Cloth  6 each Topical Daily   Chlorhexidine Gluconate Cloth  6 each Topical Q0600   darbepoetin (ARANESP)  injection - DIALYSIS  100 mcg Subcutaneous Q Mon-1800   feeding supplement (NEPRO CARB STEADY)  237 mL Oral TID BM   fentaNYL       fluticasone furoate-vilanterol  1 puff Inhalation Daily   And   umeclidinium bromide  1 puff Inhalation Daily   gabapentin  100 mg Oral TID   heparin  5,000 Units Subcutaneous Q8H   leptospermum manuka honey  1 Application Topical Daily   multivitamin  1 tablet Oral QHS   nicotine  21 mg Transdermal Daily   pantoprazole  40 mg Oral Daily   Continuous Infusions:  cefUROXime (ZINACEF)  IV     ferric gluconate (FERRLECIT) IVPB 250 mg (11/12/22 1040)  LOS: 3 days    Time spent:    Zannie Cove, MD [facility]   11/13/2022, 12:22 PM
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
## 2022-11-13 NOTE — Op Note (Signed)
DATE OF SERVICE: 11/13/2022  PATIENT:  [name]  62 y.o. female  PRE-OPERATIVE DIAGNOSIS:  ESRD  POST-OPERATIVE DIAGNOSIS:  Same  PROCEDURE:   Right second stage basilic vein fistula transposition  SURGEON:  Surgeons and Role:    * Leonie Douglas, MD - Primary  ASSISTANT: Otilio Miu, CST  ANESTHESIA:   general  EBL: minimal  BLOOD ADMINISTERED:none  DRAINS: none   LOCAL MEDICATIONS USED:  NONE  SPECIMEN:  none  COUNTS: confirmed correct.  TOURNIQUET:  none  PATIENT DISPOSITION:  PACU - hemodynamically stable.   Delay start of Pharmacological VTE agent (>24hrs) due to surgical blood loss or risk of bleeding: no  INDICATION FOR PROCEDURE: Vianey M Rocque is a 62 y.o. female with ESRD. I previously created a first stage basilic vein fistula for her 11/15/49. After careful discussion of risks, benefits, and alternatives the patient was offered second stage transposition. The patient  understood and wished to proceed.  OPERATIVE FINDINGS:  atrophic skin suggesting severe malnutrition Fistula appears adequate for transposition Thrill in fistula at completion Palpable radial pulse at completion  DESCRIPTION OF PROCEDURE: After identification of the patient in the pre-operative holding area, the patient was transferred to the operating room. The patient was positioned supine on the operating room table. Anesthesia was induced. The right arm was prepped and draped in standard fashion. A surgical pause was performed confirming correct patient, procedure, and operative location.  Using intraoperative ultrasound the course of the right basilic vein was marked on the skin.  3 skip incisions were made over the course of the basilic vein fistula.  These were carried down through subcutaneous tissue until the fistula was encountered.  The fascia was skeletonized from the axilla to the anastomosis, taking care to ligate and divide sidebranches, and to protect the medial  antebrachial cutaneous nerve.   A subcutaneous tunnel was created over the biceps using a sheath tunneling device.  Patient was heparinized.  The fistula near the anastomosis was clamped.  The outflow in the axilla was clamped.  The fistula was divided.  The fistula was marked to ensure no twisting or kinking while delivering the fistula through the tunnel.  The fistula was tunneled through the arm and delivered near the previous anastomosis.  The fistula was spatulated proximally and distally.  The fistula was reanastomosed end-to-end using continuous running suture of 5-0 Prolene.  A palpable thrill was felt over the subcutaneous course of the tunnel.  Doppler flow was excellent in the proximal and distal fistula.  The wounds were copiously irrigated.  Hemostasis was ensured in the surgical bed.  The wounds were closed in layers using 3-0 Vicryl and 4-0 Monocryl.  Upon completion of the case instrument and sharps counts were confirmed correct. The patient was transferred to the PACU in good condition. I was present for all portions of the procedure.  FOLLOW UP PLAN: Assuming a normal postoperative course, VVS PA will see the patient in 2-3 weeks for an incision check.    Rande Brunt. Lenell Antu, MD [facility] Phone Number: [phone] 11/13/2022 10:05 AM

## 2022-11-13 NOTE — Anesthesia Procedure Notes (Signed)
Procedure Name: LMA Insertion Date/Time: 11/13/2022 8:12 AM  Performed by: Ayesha Rumpf, CRNAPre-anesthesia Checklist: Patient identified, Emergency Drugs available, Suction available and Patient being monitored Patient Re-evaluated:Patient Re-evaluated prior to induction Oxygen Delivery Method: Circle System Utilized Preoxygenation: Pre-oxygenation with 100% oxygen Induction Type: IV induction Ventilation: Mask ventilation without difficulty LMA: LMA inserted LMA Size: 3.0 Number of attempts: 1 Airway Equipment and Method: Bite block Placement Confirmation: positive ETCO2 Tube secured with: Tape Dental Injury: Teeth and Oropharynx as per pre-operative assessment  Comments: Pt with existing scabbed laceration on LEFT lateral lips.  During LMA insertion, this prior-existing laceration opened and small amount of heme noted.  Phenylephrine soaked gauze applied with pressure to site.

## 2022-11-13 NOTE — Anesthesia Preprocedure Evaluation (Signed)
Anesthesia Evaluation  Patient identified by MRN, date of birth, ID band Patient awake    Reviewed: Allergy & Precautions, NPO status , Patient's Chart, lab work & pertinent test results  Airway Mallampati: II  TM Distance: >3 FB Neck ROM: Full    Dental  (+) Edentulous Upper, Edentulous Lower   Pulmonary asthma , COPD, Current Smoker and Patient abstained from smoking.   Pulmonary exam normal breath sounds clear to auscultation       Cardiovascular hypertension, +CHF  Normal cardiovascular exam Rhythm:Regular Rate:Normal     Neuro/Psych  PSYCHIATRIC DISORDERS Anxiety Depression    negative neurological ROS     GI/Hepatic PUD,GERD  ,,(+)     substance abuse  alcohol use, cocaine use and marijuana use  Endo/Other  diabetes    Renal/GU ESRF and DialysisRenal disease     Musculoskeletal  (+) Arthritis ,    Abdominal   Peds  Hematology negative hematology ROS (+)   Anesthesia Other Findings Day of surgery medications reviewed with the patient.  Reproductive/Obstetrics                             Anesthesia Physical Anesthesia Plan  ASA: 3  Anesthesia Plan: General   Post-op Pain Management: Tylenol PO (pre-op)* and Gabapentin PO (pre-op)*   Induction: Intravenous  PONV Risk Score and Plan: 2 and Dexamethasone and Ondansetron  Airway Management Planned: Oral ETT  Additional Equipment:   Intra-op Plan:   Post-operative Plan: Extubation in OR  Informed Consent: I have reviewed the patients History and Physical, chart, labs and discussed the procedure including the risks, benefits and alternatives for the proposed anesthesia with the patient or authorized representative who has indicated his/her understanding and acceptance.     Dental advisory given  Plan Discussed with: CRNA  Anesthesia Plan Comments:        Anesthesia Quick Evaluation

## 2022-11-13 NOTE — Anesthesia Postprocedure Evaluation (Signed)
Anesthesia Post Note  Patient: [name]  Procedure(s) Performed: RIGHT ARM SECOND STAGE BASILIC VEIN TRANSPOSITION (Right)     Patient location during evaluation: PACU Anesthesia Type: General Level of consciousness: awake and alert Pain management: pain level controlled Vital Signs Assessment: post-procedure vital signs reviewed and stable Respiratory status: spontaneous breathing, nonlabored ventilation and respiratory function stable Cardiovascular status: stable and blood pressure returned to baseline Anesthetic complications: no   No notable events documented.  Last Vitals:  Vitals:   11/13/22 1045 11/13/22 1117  BP: (!) 167/65 125/86  Pulse: 81 88  Resp: 12 18  Temp: 36.7 C 36.8 C  SpO2: 95% 95%    Last Pain:  Vitals:   11/13/22 1117  TempSrc: Oral  PainSc:    Pain Goal: Patients Stated Pain Goal: 0 (11/10/22 1325)                 Beryle Lathe

## 2022-11-13 NOTE — Progress Notes (Addendum)
[facility] Progress Note   Subjective:   Seen in room - s/p R 2nd stage BVT this morning - went well. No CP/dyspnea at this time - still wearing O2. For HD tomorrow. Social issues/homelessness are precluding discharge at this time.  Objective Vitals:   11/13/22 1015 11/13/22 1030 11/13/22 1045 11/13/22 1117  BP: (!) 164/73 (!) 173/88 (!) 167/65 125/86  Pulse: 88 92 81 88  Resp: 14 16 12 18   Temp:   98 F (36.7 C) 98.3 F (36.8 C)  TempSrc:    Oral  SpO2: 95% 95% 95% 95%  Weight:      Height:       Physical Exam General: Chronically ill appearing woman, NAD. Hen[place] O2 in place Heart: RRR; no murmur Lungs: CTA anteriorly Abdomen: soft Extremities: trace BLE edema,very tender Dialysis Access: TDC + RUE AVF with intact incision s/p transposition surgery this AM + thrill  Additional Objective Labs: Basic Metabolic Panel: Recent Labs  Lab 11/11/22 0325 11/12/22 0558 11/13/22 0403  NA 135 137 135  K 4.3 3.7 4.1  CL 102 102 104  CO2 18* 25 22  GLUCOSE 87 88 94  BUN 61* 32* 48*  CREATININE 5.56* 2.81* 3.70*  CALCIUM 7.2* 8.0* 8.5*  PHOS 7.9* 4.5 4.5   Liver Function Tests: Recent Labs  Lab 11/09/22 2126 11/10/22 0326 11/11/22 0325 11/12/22 0558 11/13/22 0403  AST 44* 28  --   --   --   ALT 25 21  --   --   --   ALKPHOS 97 87  --   --   --   BILITOT 0.5 0.6  --   --   --   PROT 6.6 5.9*  --   --   --   ALBUMIN 2.6* 2.2* 2.1* 2.1* 2.1*   CBC: Recent Labs  Lab 11/09/22 2126 11/10/22 0326 11/10/22 1553 11/11/22 1150  WBC 8.2 6.4  --  5.4  NEUTROABS 5.5 3.8  --   --   HGB 7.1* 6.3* 7.3* 8.1*  HCT 22.5* 19.3* 22.4* 24.5*  MCV 98.3 97.5  --  93.9  PLT 362 332  --  360   Medications:  cefUROXime (ZINACEF)  IV     ferric gluconate (FERRLECIT) IVPB 250 mg (11/12/22 1040)    (feeding supplement) PROSource Plus  30 mL Oral BID BM   amLODipine  10 mg Oral Daily   atorvastatin  10 mg Oral Daily   busPIRone  10 mg Oral BID   calcium acetate  667  mg Oral TID WC   Chlorhexidine Gluconate Cloth  6 each Topical Daily   Chlorhexidine Gluconate Cloth  6 each Topical Q0600   darbepoetin (ARANESP) injection - DIALYSIS  100 mcg Subcutaneous Q Mon-1800   feeding supplement (NEPRO CARB STEADY)  237 mL Oral TID BM   fentaNYL       fluticasone furoate-vilanterol  1 puff Inhalation Daily   And   umeclidinium bromide  1 puff Inhalation Daily   gabapentin  100 mg Oral TID   heparin  5,000 Units Subcutaneous Q8H   leptospermum manuka honey  1 Application Topical Daily   multivitamin  1 tablet Oral QHS   nicotine  21 mg Transdermal Daily   pantoprazole  40 mg Oral Daily    Dialysis Orders: [facility]  3h  52kg  400/1.5   2/2 bath  RIJ TDC  Heparin none - last OP HD 6/27 - last inpt HD was 11/01/22 ->  she never started the [place] clinic - rocaltrol 0.5 mcg po three times per week - venofer 100mg  q hd 8/22- 9/07 - mircera 60 mcg IV q 2 wks, never started   Assessment/Plan: 1. Pulm edema: In setting of not going to HD for > 1 week. HD 8/26, improved. 2. ESRD: TTS schedule - HD 8/26, next tomorrow. Her AVF was transposed this am - appreciated VVS. Still using TDC until this heals (~1 mo). 3. HTN/volume: BP decent, LE edema is a little better. Re-establishing EDW. 4. Anemia of ESRD: Hgb very low on admit. Given 1U PRBCs 8/25. Continue Aranesp q Monday while here. Hgb 8.1 on last check. 5. Secondary hyperparathyroidism: Ca/Phos much better - continue binders.  6. Nutrition: Alb low, continue supplements. 7. Homelessness: No permanent address or phone - arranging HD transportation is impossible for her at this moment, SW/CM involved.  Ozzie Hoyle, PA-C 11/13/2022, 12:48 PM  [facility]   Seen and examined independently.  Agree with note and exam as documented above by physician extender and as noted here.  General adult female in bed in no acute distress; chronically ill-appearing  HEENT normocephalic atraumatic  extraocular movements intact sclera anicteric Neck supple trachea midline Lungs clear to auscultation bilaterally normal work of breathing at rest on 2 liters oxygen Heart S1S2 no rub Abdomen soft nontender nondistended Extremities no edema; legs tender Psych she is frustrated but appropriate  Access RIJ tunn catheter; RUE AVF bruit and thrill   ESRD - HD per TTS schedule   Homelessness - appreciate SW.  I have recommended a homeless shelter and she states that she would rather just sleep behind a building than have to get up at 6am.  Discussed safety issues with that plan    Right AVF in situ - s/p right 2nd stage BVT earlier today.  Appreciate vascular.      Anemia CKD - on ESA. PRBC's per primary team   Disposition per primary team.  Appreciate social work assistance.     Estanislado Emms, MD 11/13/2022 2:47 PM

## 2022-11-13 NOTE — Transfer of Care (Signed)
Immediate Anesthesia Transfer of Care Note  Patient: [name]  Procedure(s) Performed: RIGHT ARM SECOND STAGE BASILIC VEIN TRANSPOSITION (Right)  Patient Location: PACU  Anesthesia Type:General  Level of Consciousness: awake, patient cooperative, and responds to stimulation  Airway & Oxygen Therapy: Patient Spontanous Breathing and Patient connected to nasal cannula oxygen  Post-op Assessment: Report given to RN and Post -op Vital signs reviewed and stable  Post vital signs: Reviewed and stable  Last Vitals:  Vitals Value Taken Time  BP 160/67 11/13/22 1009  Temp    Pulse 88 11/13/22 1011  Resp 11 11/13/22 1011  SpO2 97 % 11/13/22 1011  Vitals shown include unfiled device data.  Last Pain:  Vitals:   11/13/22 0713  TempSrc:   PainSc: 7       Patients Stated Pain Goal: 0 (11/10/22 1325)  Complications: No notable events documented.

## 2022-11-13 NOTE — Discharge Instructions (Addendum)
   [facility]  Discharge Instructions  AV Fistula or Graft Surgery for Dialysis Access  Please refer to the following instructions for your post-procedure care. Your surgeon or physician assistant will discuss any changes with you.  Activity  You may drive the day following your surgery, if you are comfortable and no longer taking prescription pain medication. Resume full activity as the soreness in your incision resolves.  Bathing/Showering  You may shower after you go home. Keep your incision dry for 48 hours. Do not soak in a bathtub, hot tub, or swim until the incision heals completely. You may not shower if you have a hemodialysis catheter.  Incision Care  Clean your incision with mild soap and water after 48 hours. Pat the area dry with a clean towel. You do not need a bandage unless otherwise instructed. Do not apply any ointments or creams to your incision. You may have skin glue on your incision. Do not peel it off. It will come off on its own in about one week. Your arm may swell a bit after surgery. To reduce swelling use pillows to elevate your arm so it is above your heart. Your doctor will tell you if you need to lightly wrap your arm with an ACE bandage.  Diet  Resume your normal diet. There are not special food restrictions following this procedure. In order to heal from your surgery, it is CRITICAL to get adequate nutrition. Your body requires vitamins, minerals, and protein. Vegetables are the best source of vitamins and minerals. Vegetables also provide the perfect balance of protein. Processed food has little nutritional value, so try to avoid this.  Medications  Resume taking all of your medications. If your incision is causing pain, you may take over-the counter pain relievers such as acetaminophen (Tylenol). If you were prescribed a stronger pain medication, please be aware these medications can cause nausea and constipation. Prevent  nausea by taking the medication with a snack or meal. Avoid constipation by drinking plenty of fluids and eating foods with high amount of fiber, such as fruits, vegetables, and grains.  Do not take Tylenol if you are taking prescription pain medications.  Follow up Your surgeon may want to see you in the office following your access surgery. If so, this will be arranged at the time of your surgery.  Please call us immediately for any of the following conditions:  Increased pain, redness, drainage (pus) from your incision site Fever of 101 degrees or higher Severe or worsening pain at your incision site Hand pain or numbness.  Reduce your risk of vascular disease:  Stop smoking. If you would like help, call [facility] at 1-800-QUIT-NOW (570-041-2106) or [place] at [phone]  Manage your cholesterol Maintain a desired weight Control your diabetes Keep your blood pressure down  Dialysis  It will take several weeks to several months for your new dialysis access to be ready for use. Your surgeon will determine when it is okay to use it. Your nephrologist will continue to direct your dialysis. You can continue to use your Permcath until your new access is ready for use.   11/13/2022 [name] [id number] 02-Oct-1960  Surgeon(s): Leonie Douglas, MD  Procedure(s): RIGHT ARM SECOND STAGE BASILIC VEIN TRANSPOSITION  x Do not stick fistula for 5 weeks    If you have any questions, please call the office at [phone].

## 2022-11-14 ENCOUNTER — Encounter (HOSPITAL_COMMUNITY): Payer: Self-pay | Admitting: Vascular Surgery

## 2022-11-14 ENCOUNTER — Inpatient Hospital Stay (HOSPITAL_COMMUNITY): Payer: Medicare Other

## 2022-11-14 DIAGNOSIS — N186 End stage renal disease: Secondary | ICD-10-CM | POA: Diagnosis not present

## 2022-11-14 DIAGNOSIS — Z992 Dependence on renal dialysis: Secondary | ICD-10-CM | POA: Diagnosis not present

## 2022-11-14 LAB — BASIC METABOLIC PANEL
Anion gap: 11 (ref 5–15)
BUN: 56 mg/dL — ABNORMAL HIGH (ref 8–23)
CO2: 19 mmol/L — ABNORMAL LOW (ref 22–32)
Calcium: 8.4 mg/dL — ABNORMAL LOW (ref 8.9–10.3)
Chloride: 106 mmol/L (ref 98–111)
Creatinine, Ser: 4.08 mg/dL — ABNORMAL HIGH (ref 0.44–1.00)
GFR, Estimated: 12 mL/min — ABNORMAL LOW (ref >60)
Glucose, Bld: 101 mg/dL — ABNORMAL HIGH (ref 70–99)
Potassium: 4.6 mmol/L (ref 3.5–5.1)
Sodium: 136 mmol/L (ref 135–145)

## 2022-11-14 LAB — CBC
HCT: 22.6 % — ABNORMAL LOW (ref 36.0–46.0)
HCT: 23.3 % — ABNORMAL LOW (ref 36.0–46.0)
Hemoglobin: 7.1 g/dL — ABNORMAL LOW (ref 12.0–15.0)
Hemoglobin: 7.5 g/dL — ABNORMAL LOW (ref 12.0–15.0)
MCH: 30.9 pg (ref 26.0–34.0)
MCH: 32.2 pg (ref 26.0–34.0)
MCHC: 31.4 g/dL (ref 30.0–36.0)
MCHC: 32.2 g/dL (ref 30.0–36.0)
MCV: 98.3 fL (ref 80.0–100.0)
MCV: 100 fL (ref 80.0–100.0)
Platelets: 355 10*3/uL (ref 150–400)
Platelets: 340 10*3/uL (ref 150–400)
RBC: 2.3 MIL/uL — ABNORMAL LOW (ref 3.87–5.11)
RBC: 2.33 MIL/uL — ABNORMAL LOW (ref 3.87–5.11)
RDW: 19.8 % — ABNORMAL HIGH (ref 11.5–15.5)
RDW: 19.9 % — ABNORMAL HIGH (ref 11.5–15.5)
WBC: 7.2 10*3/uL (ref 4.0–10.5)
WBC: 6.4 10*3/uL (ref 4.0–10.5)
nRBC: 0.6 % — ABNORMAL HIGH (ref 0.0–0.2)
nRBC: 0.6 % — ABNORMAL HIGH (ref 0.0–0.2)

## 2022-11-14 LAB — RENAL FUNCTION PANEL
Albumin: 2.1 g/dL — ABNORMAL LOW (ref 3.5–5.0)
Anion gap: 14 (ref 5–15)
BUN: 56 mg/dL — ABNORMAL HIGH (ref 8–23)
CO2: 19 mmol/L — ABNORMAL LOW (ref 22–32)
Calcium: 8.6 mg/dL — ABNORMAL LOW (ref 8.9–10.3)
Chloride: 101 mmol/L (ref 98–111)
Creatinine, Ser: 3.92 mg/dL — ABNORMAL HIGH (ref 0.44–1.00)
GFR, Estimated: 12 mL/min — ABNORMAL LOW (ref >60)
Glucose, Bld: 104 mg/dL — ABNORMAL HIGH (ref 70–99)
Phosphorus: 4.2 mg/dL (ref 2.5–4.6)
Potassium: 5.1 mmol/L (ref 3.5–5.1)
Sodium: 134 mmol/L — ABNORMAL LOW (ref 135–145)

## 2022-11-14 MED ORDER — CEFAZOLIN SODIUM-DEXTROSE 1-4 GM/50ML-% IV SOLN
1.0000 g | INTRAVENOUS | Status: DC
  Administered 2022-11-14: 1 g via INTRAVENOUS
  Filled 2022-11-14: qty 50

## 2022-11-14 MED ORDER — PENTAFLUOROPROP-TETRAFLUOROETH EX AERO: 1.0000 | INHALATION_SPRAY | CUTANEOUS | Status: DC | PRN

## 2022-11-14 MED ORDER — LIDOCAINE HCL (PF) 1 % IJ SOLN: 5.0000 mL | INTRAMUSCULAR | Status: DC | PRN

## 2022-11-14 MED ORDER — ANTICOAGULANT SODIUM CITRATE 4% (200MG/5ML) IV SOLN
5.0000 mL | Status: DC | PRN
  Filled 2022-11-14: qty 5

## 2022-11-14 MED ORDER — ALTEPLASE 2 MG IJ SOLR: 2.0000 mg | Freq: Once | INTRAMUSCULAR | Status: DC | PRN

## 2022-11-14 MED ORDER — LIDOCAINE-PRILOCAINE 2.5-2.5 % EX CREA: 1.0000 | TOPICAL_CREAM | CUTANEOUS | Status: DC | PRN

## 2022-11-14 MED ORDER — HEPARIN SODIUM (PORCINE) 1000 UNIT/ML DIALYSIS
1000.0000 [IU] | INTRAMUSCULAR | Status: DC | PRN
  Administered 2022-11-14: 1000 [IU]
  Filled 2022-11-14: qty 1

## 2022-11-14 MED ORDER — HYDROMORPHONE HCL 1 MG/ML IJ SOLN
0.5000 mg | INTRAMUSCULAR | Status: DC | PRN
  Administered 2022-11-14 – 2022-11-24 (×10): 0.5 mg via INTRAVENOUS
  Filled 2022-11-14: qty 0.5
  Filled 2022-11-14: qty 1
  Filled 2022-11-14: qty 0.5
  Filled 2022-11-14: qty 1
  Filled 2022-11-14 (×2): qty 0.5
  Filled 2022-11-14: qty 1
  Filled 2022-11-14: qty 0.5
  Filled 2022-11-14: qty 1
  Filled 2022-11-14 (×2): qty 0.5

## 2022-11-14 NOTE — Progress Notes (Signed)
PROGRESS NOTE    [name]  YQM:[id number] DOB: June 22, 1960 DOA: 11/09/2022 PCP: [name], MD  C2/F with ESRD on hemodialysis, chronic diastolic CHF, COPD, dyslipidemia presented to the ED with worsening lower extremity edema. -Recent admission 8/9-8/17 for sepsis from pneumonia complicated by renal failure, eventually required hemodialysis, since discharge was noncompliant with HD. -Volume overloaded on admission, with creatinine of 5.2, BUN 59, BNP 3 3, troponin 23, hemoglobin 7.1, chest x-ray noted bilateral interstitial infiltrates, right pleural effusion -8/23 hemoglobin down to 6.3, transfuse 1 unit PRBC 8/26 HD with ultrafiltration, 3 L   Subjective: Pain at the site of her left AC, previous IV   Assessment and Plan:  End-stage renal disease on hemodialysis (HCC) Volume overload, noncompliance, hyponatremia -Did not do outpatient HD X 10 days prior to admission -Volume status improving, second HD today -Vascular surgery consulted for AV fistula, second step completed yesterday -Disposition complicated by homelessness-no phone or permanent address, unable to have HD transportation in the setting social work following, plans to go to a motel next week after she gets her check  Left arm thrombophlebitis, cellulitis -At site of previous IV, IV site changed -Add cold compresses, IV Ancef  Anemia of chronic renal disease,  -sp one unit PRBC transfusion  Iron deficiency anemia, with serum iron 7, TIBC 200, transferrin saturation of 4 and ferritin 187.  SP IV iron 8/26, added EPO. -Hemoglobin down to 7.1, may need transfusion tomorrow  Acute on chronic diastolic CHF (congestive heart failure) (HCC) Echocardiogram with preserved LV systolic function with EF 55 to 60%, RV systolic function preserved, mild aortic valve stenosis, RVSP 43.1 mmHg.  -Volume management with HD  Essential hypertension -Improving with HD, continue amlodipine  History of  COPD Stable Continue with bronchodilator therapy.   Hyperlipidemia Continue with statin therapy.   GAD (generalized anxiety disorder) Continue with hydroxyzine and buspirone.   Homelessness  Pressure injury of skin Present on admission.  Stage 2 pressure ulcer left buttock and coccyx.  Continue local skin care.   DVT prophylaxis: heparin SQ Code Status: Full COde Family Communication: None present Disposition Plan: Homeless  Consultants:    Procedures:   Antimicrobials:    Objective: Vitals:   11/13/22 2018 11/14/22 0133 11/14/22 0506 11/14/22 0845  BP: (!) 145/82 107/62 97/70   Pulse: 88 81 88 78  Resp: 19 15 16 17   Temp: 98.3 F (36.8 C) 98 F (36.7 C) 98 F (36.7 C)   TempSrc: Oral Oral Oral   SpO2: 95% 94% 95% 96%  Weight:   51.2 kg   Height:        Intake/Output Summary (Last 24 hours) at 11/14/2022 1214 Last data filed at 11/14/2022 0514 Gross per 24 hour  Intake 360 ml  Output 900 ml  Net -540 ml   Filed Weights   11/11/22 1712 11/12/22 0335 11/14/22 0506  Weight: 50.1 kg 51.3 kg 51.2 kg    Examination:  Chronically ill female laying in bed, AAOx3 HEENT: No JVD, right IJ HD catheter CVS: S1-S2, regular rhythm Lungs: Decreased breath sounds at the bases Abdomen: Soft, nontender, bowel sounds present Extremities no edema, right arm AV fistula, surgical scars, left forearm with thrombophlebitis   Data Reviewed:   CBC: Recent Labs  Lab 11/09/22 2126 11/10/22 0326 11/10/22 1553 11/11/22 1150 11/14/22 0331  WBC 8.2 6.4  --  5.4 7.2  NEUTROABS 5.5 3.8  --   --   --   HGB 7.1* 6.3* 7.3* 8.1* 7.1*  HCT 22.5* 19.3* 22.4* 24.5* 22.6*  MCV 98.3 97.5  --  93.9 98.3  PLT 362 332  --  360 355   Basic Metabolic Panel: Recent Labs  Lab 11/09/22 2126 11/10/22 0326 11/11/22 0325 11/12/22 0558 11/13/22 0403 11/14/22 0331  NA 135 132* 135 137 135 136  K 3.9 3.5 4.3 3.7 4.1 4.6  CL 99 102 102 102 104 106  CO2 16* 18* 18* 25 22 19*   GLUCOSE 84 74 87 88 94 101*  BUN 59* 56* 61* 32* 48* 56*  CREATININE 5.20* 5.24* 5.56* 2.81* 3.70* 4.08*  CALCIUM 7.5* 7.0* 7.2* 8.0* 8.5* 8.4*  MG 1.9 1.8  --   --   --   --   PHOS  --  8.4* 7.9* 4.5 4.5  --    GFR: Estimated Creatinine Clearance: 11.6 mL/min (A) (by C-G formula based on SCr of 4.08 mg/dL (H)). Liver Function Tests: Recent Labs  Lab 11/09/22 2126 11/10/22 0326 11/11/22 0325 11/12/22 0558 11/13/22 0403  AST 44* 28  --   --   --   ALT 25 21  --   --   --   ALKPHOS 97 87  --   --   --   BILITOT 0.5 0.6  --   --   --   PROT 6.6 5.9*  --   --   --   ALBUMIN 2.6* 2.2* 2.1* 2.1* 2.1*   No results for input(s): "LIPASE", "AMYLASE" in the last 168 hours. No results for input(s): "AMMONIA" in the last 168 hours. Coagulation Profile: Recent Labs  Lab 11/10/22 0326  INR 1.2   Cardiac Enzymes: No results for input(s): "CKTOTAL", "CKMB", "CKMBINDEX", "TROPONINI" in the last 168 hours. BNP (last 3 results) No results for input(s): "PROBNP" in the last 8760 hours. HbA1C: No results for input(s): "HGBA1C" in the last 72 hours. CBG: Recent Labs  Lab 11/10/22 0145 11/13/22 0658  GLUCAP 106* 85   Lipid Profile: No results for input(s): "CHOL", "HDL", "LDLCALC", "TRIG", "CHOLHDL", "LDLDIRECT" in the last 72 hours. Thyroid Function Tests: No results for input(s): "TSH", "T4TOTAL", "FREET4", "T3FREE", "THYROIDAB" in the last 72 hours. Anemia Panel: No results for input(s): "VITAMINB12", "FOLATE", "FERRITIN", "TIBC", "IRON", "RETICCTPCT" in the last 72 hours. Urine analysis:    Component Value Date/Time   COLORURINE YELLOW 10/25/2022 2015   APPEARANCEUR HAZY (A) 10/25/2022 2015   LABSPEC 1.009 10/25/2022 2015   PHURINE 5.0 10/25/2022 2015   GLUCOSEU 50 (A) 10/25/2022 2015   HGBUR LARGE (A) 10/25/2022 2015   BILIRUBINUR NEGATIVE 10/25/2022 2015   KETONESUR NEGATIVE 10/25/2022 2015   PROTEINUR 100 (A) 10/25/2022 2015   UROBILINOGEN 0.2 03/02/2010 2027    NITRITE POSITIVE (A) 10/25/2022 2015   LEUKOCYTESUR MODERATE (A) 10/25/2022 2015   Sepsis Labs: @LABRCNTIP (procalcitonin:4,lacticidven:4)  )No results found for this or any previous visit (from the past 240 hour(s)).   Radiology Studies: [place] LT UPPER EXTREM LTD SOFT TISSUE NON VASCULAR  Result Date: 11/14/2022 CLINICAL DATA:  IV site extravasation. EXAM: ULTRASOUND LEFT UPPER EXTREMITY LIMITED TECHNIQUE: Ultrasound examination of the upper extremity soft tissues was performed in the area of clinical concern. COMPARISON:  None Available. FINDINGS: Focused ultrasound of the left forearm demonstrates prominent subcutaneous edema and hypervascularity without focal fluid collection or mass. A superficial vein in this area appears thrombosed. IMPRESSION: 1. Findings suggestive of left forearm superficial thrombophlebitis. Electronically Signed   By: Obie Dredge M.D.   On: 11/14/2022 11:41     Scheduled  Meds:  (feeding supplement) PROSource Plus  30 mL Oral BID BM   amLODipine  10 mg Oral Daily   atorvastatin  10 mg Oral Daily   busPIRone  10 mg Oral BID   calcium acetate  667 mg Oral TID WC   Chlorhexidine Gluconate Cloth  6 each Topical Q0600   darbepoetin (ARANESP) injection - DIALYSIS  100 mcg Subcutaneous Q Mon-1800   feeding supplement (NEPRO CARB STEADY)  237 mL Oral TID BM   fluticasone furoate-vilanterol  1 puff Inhalation Daily   And   umeclidinium bromide  1 puff Inhalation Daily   gabapentin  100 mg Oral TID   heparin  5,000 Units Subcutaneous Q8H   leptospermum manuka honey  1 Application Topical Daily   multivitamin  1 tablet Oral QHS   nicotine  21 mg Transdermal Daily   pantoprazole  40 mg Oral Daily   Continuous Infusions:  anticoagulant sodium citrate      ceFAZolin (ANCEF) IV     ferric gluconate (FERRLECIT) IVPB Stopped (11/13/22 2230)     LOS: 4 days    Time spent:    Zannie Cove, MD [facility]   11/14/2022, 12:14 PM

## 2022-11-14 NOTE — Progress Notes (Signed)
  Progress Note    11/14/2022 7:55 AM 1 Day Post-Op  Subjective:  R arm pain   Vitals:   11/14/22 0133 11/14/22 0506  BP: 107/62 97/70  Pulse: 81 88  Resp: 15 16  Temp: 98 F (36.7 C) 98 F (36.7 C)  SpO2: 94% 95%   Physical Exam: Lungs:  non labored Incisions:  R arm incisions c/d/I without hematoma Extremities:  palpable R radial pulse; palpable thrill through fistula Neurologic: A&O  CBC    Component Value Date/Time   WBC 7.2 11/14/2022 0331   RBC 2.30 (L) 11/14/2022 0331   HGB 7.1 (L) 11/14/2022 0331   HGB 6.0 (LL) 06/06/2022 1410   HCT 22.6 (L) 11/14/2022 0331   HCT 18.1 (L) 06/06/2022 1410   PLT 355 11/14/2022 0331   PLT 353 06/06/2022 1410   MCV 98.3 11/14/2022 0331   MCV 96 06/06/2022 1410   MCH 30.9 11/14/2022 0331   MCHC 31.4 11/14/2022 0331   RDW 19.8 (H) 11/14/2022 0331   RDW 13.6 06/06/2022 1410   LYMPHSABS 1.7 11/10/2022 0326   LYMPHSABS 1.3 06/06/2022 1410   MONOABS 0.5 11/10/2022 0326   EOSABS 0.2 11/10/2022 0326   EOSABS 0.4 06/06/2022 1410   BASOSABS 0.1 11/10/2022 0326   BASOSABS 0.0 06/06/2022 1410    BMET    Component Value Date/Time   NA 136 11/14/2022 0331   NA 137 06/06/2022 1410   K 4.6 11/14/2022 0331   CL 106 11/14/2022 0331   CO2 19 (L) 11/14/2022 0331   GLUCOSE 101 (H) 11/14/2022 0331   BUN 56 (H) 11/14/2022 0331   BUN 37 (H) 06/06/2022 1410   CREATININE 4.08 (H) 11/14/2022 0331   CREATININE 1.64 (H) 04/29/2016 1047   CALCIUM 8.4 (L) 11/14/2022 0331   CALCIUM 7.9 (L) 08/15/2022 0104   GFRNONAA 12 (L) 11/14/2022 0331   GFRNONAA 35 (L) 04/29/2016 1047   GFRAA 36 (L) 08/31/2019 1230   GFRAA 40 (L) 04/29/2016 1047    INR    Component Value Date/Time   INR 1.2 11/10/2022 0326     Intake/Output Summary (Last 24 hours) at 11/14/2022 0755 Last data filed at 11/14/2022 6213 Gross per 24 hour  Intake 750 ml  Output 902 ml  Net -152 ml     Assessment/Plan:  62 y.o. female is s/p R 2nd stage basilic vein fistula 1  Day Post-Op   R basilic vein fistula with palpable thrill and no signs of steal syndrome Ok for discharge from vascular standpoint Office will arrange follow up in 2-3 weeks for incision check   Cynthia Rutter, PA-C [facility] [phone] 11/14/2022 7:55 AM

## 2022-11-14 NOTE — Progress Notes (Signed)
   11/14/22 1721  Vitals  Temp 97.6 F (36.4 C)  Pulse Rate 86  Resp 18  BP 110/89  SpO2 96 %  O2 Device Room Air  Weight 51.3 kg  Type of Weight Post-Dialysis  Oxygen Therapy  Patient Activity (if Appropriate) In bed  Pulse Oximetry Type Continuous  Post Treatment  Dialyzer Clearance Lightly streaked  Hemodialysis Intake (mL) 0 mL  Liters Processed 81.2  Fluid Removed (mL) 2000 mL (mLs)  Tolerated HD Treatment Yes  Post-Hemodialysis Comments pt stable. Vitals stable.  During Treatment Monitoring  Duration of HD Treatment -hour(s) 3.5 hour(s)   Received patient in bed to unit.  Alert and oriented.  Informed consent signed and in chart.   TX duration:3.5hrs  Patient tolerated well.  Transported back to the room  Alert, without acute distress.  Hand-off given to patient's nurse.   Access used: [facility] Access issues: none  Total UF removed: 2L Medication(s) given: oxycodone    [facility]

## 2022-11-14 NOTE — Progress Notes (Signed)
Occupational Therapy Treatment Patient Details Name: [name] MRN: [id number] DOB: 20-Aug-1960 Today's Date: 11/14/2022   History of present illness Pt is a 62 y/o F presenting to ED on 8.24 with worsening LE edema, admitted for ESRD on HD. recent hospitalization 8/09-8/17 for sepsis 2/2 PNA complicated renal failure that required renal replacement therapy. PMH includes ESRD on HD, COPD, heart failure, HLD   OT comments  Pt with limited participation in OT session this am secondary to increased pain from surgery in the right arm and IV infiltration in the left.  She only transitioned to EOB and worked on grooming task of brushing her hair before stopping and stating "I can't do it, I'm in too much pain".  Nursing notified for pain meds and pt positioned back in bed.  Discussed that PT would be coming by later today to get her up and moving as well as possibly mobility specialist.  Recommend continued acute care OT at this time to help progress to independent level.  Pt reports she doesn't know what she is doing or where she is going at discharge as she's homeless.       If plan is discharge home, recommend the following:  A little help with bathing/dressing/bathroom;Assistance with cooking/housework   Equipment Recommendations  None recommended by OT       Precautions / Restrictions Precautions Precautions: Fall Restrictions Weight Bearing Restrictions: No       Mobility Bed Mobility Overal bed mobility: Modified Independent             General bed mobility comments: Supine to sit and sit to supine with HOB elevated and use of rails.    Transfers                         Balance   Sitting-balance support: No upper extremity supported Sitting balance-Leahy Scale: Good Sitting balance - Comments: Pt able to sit EOB statically without LOB.                                   ADL either performed or assessed with clinical judgement   ADL Overall  ADL's : Needs assistance/impaired Eating/Feeding: Moderate assistance;Sitting Eating/Feeding Details (indicate cue type and reason): to brush hair.                                   General ADL Comments: Limited session secondary to pt reporting increased pain in the arms and refusal to get OOB after sitting EOB and working on brushing her hair.  She brushed part of it and then stopped stating she can't do it.  She declined further activity but said she would get up and walk later.  I encouraged her to participate with PT and mobiilty therapist if scheuled.      Cognition Arousal: Alert Behavior During Therapy: Agitated Overall Cognitive Status: No family/caregiver present to determine baseline cognitive functioning                                 General Comments: Pt with increased agitation that therapist came to see her.  "I just ate, my arm hurts, I can't do it.  Therapist contacted nursing for meds and offered support while encouraging activity.  Pertinent Vitals/ Pain       Pain Assessment Pain Assessment: Faces Faces Pain Scale: Hurts whole lot Pain Location: right and left arm Pain Descriptors / Indicators: Discomfort, Grimacing, Guarding Pain Intervention(s): Limited activity within patient's tolerance, Monitored during session         Frequency  Min 1X/week        Progress Toward Goals  OT Goals(current goals can now be found in the care plan section)  Progress towards OT goals: Progressing toward goals  Acute Rehab OT Goals Patient Stated Goal: To get her pain better. OT Goal Formulation: With patient Time For Goal Achievement: 11/26/22 Potential to Achieve Goals: Good  Plan         AM-PAC OT "6 Clicks" Daily Activity     Outcome Measure   Help from another person eating meals?: None Help from another person taking care of personal grooming?: A Little Help from another person toileting, which includes  using toliet, bedpan, or urinal?: A Little Help from another person bathing (including washing, rinsing, drying)?: A Little Help from another person to put on and taking off regular upper body clothing?: A Little Help from another person to put on and taking off regular lower body clothing?: A Lot 6 Click Score: 18       Activity Tolerance Treatment limited secondary to agitation;Patient limited by pain   Patient Left in bed;with call bell/phone within reach   Nurse Communication Other (comment) (progression of limited session secondary to pain)        Time: 1610-9604 OT Time Calculation (min): 25 min  Charges: OT General Charges $OT Visit: 1 Visit OT Treatments $Self Care/Home Management : 23-37 mins   Xandrea Clarey OTR/L 11/14/2022, 10:50 AM

## 2022-11-14 NOTE — Plan of Care (Signed)

## 2022-11-14 NOTE — Progress Notes (Signed)
Patient arrived back to unit after HD. Patient found crying hysterically. Patient states she had a "horrible time downstairs." Patient complaints of pain in left arm. Noncompliant with ice.Dr. Jomarie Longs notified. IV pain medication ordered.

## 2022-11-14 NOTE — Plan of Care (Signed)
  Problem: Activity: Goal: Risk for activity intolerance will decrease Outcome: Progressing   Problem: Coping: Goal: Level of anxiety will decrease Outcome: Progressing   

## 2022-11-14 NOTE — Progress Notes (Signed)
PT Cancellation Note  Patient Details Name: [name] MRN: [id number] DOB: [birth year]   Cancelled Treatment:    Reason Eval/Treat Not Completed: Patient at procedure or test/unavailable - at HD, PT to check back as able.   Marye Round, PT DPT Acute Rehabilitation Services Secure Chat Preferred  Office [phone]    Truddie Coco 11/14/2022, 3:32 PM

## 2022-11-14 NOTE — Progress Notes (Addendum)
[facility] Progress Note   Subjective:  Seen in room - c/o L arm pain from IV. Breathing stable. Leg pain improved. For HD later today.    Objective Vitals:   11/13/22 2018 11/14/22 0133 11/14/22 0506 11/14/22 0845  BP: (!) 145/82 107/62 97/70   Pulse: 88 81 88 78  Resp: 19 15 16 17   Temp: 98.3 F (36.8 C) 98 F (36.7 C) 98 F (36.7 C)   TempSrc: Oral Oral Oral   SpO2: 95% 94% 95% 96%  Weight:   51.2 kg   Height:       Physical Exam General: Chronically ill appearing woman, NAD. Room air. Heart: RRR; no murmur Lungs: CTA anteriorly Abdomen: soft Extremities: trace BLE edema  Dialysis Access: TDC + RUE AVF with intact incision + thrill/bruit   Additional Objective Labs: Basic Metabolic Panel: Recent Labs  Lab 11/11/22 0325 11/12/22 0558 11/13/22 0403 11/14/22 0331  NA 135 137 135 136  K 4.3 3.7 4.1 4.6  CL 102 102 104 106  CO2 18* 25 22 19*  GLUCOSE 87 88 94 101*  BUN 61* 32* 48* 56*  CREATININE 5.56* 2.81* 3.70* 4.08*  CALCIUM 7.2* 8.0* 8.5* 8.4*  PHOS 7.9* 4.5 4.5  --    Liver Function Tests: Recent Labs  Lab 11/09/22 2126 11/10/22 0326 11/11/22 0325 11/12/22 0558 11/13/22 0403  AST 44* 28  --   --   --   ALT 25 21  --   --   --   ALKPHOS 97 87  --   --   --   BILITOT 0.5 0.6  --   --   --   PROT 6.6 5.9*  --   --   --   ALBUMIN 2.6* 2.2* 2.1* 2.1* 2.1*   CBC: Recent Labs  Lab 11/09/22 2126 11/10/22 0326 11/10/22 1553 11/11/22 1150 11/14/22 0331  WBC 8.2 6.4  --  5.4 7.2  NEUTROABS 5.5 3.8  --   --   --   HGB 7.1* 6.3* 7.3* 8.1* 7.1*  HCT 22.5* 19.3* 22.4* 24.5* 22.6*  MCV 98.3 97.5  --  93.9 98.3  PLT 362 332  --  360 355   Medications:  anticoagulant sodium citrate      ceFAZolin (ANCEF) IV     ferric gluconate (FERRLECIT) IVPB Stopped (11/13/22 2230)    (feeding supplement) PROSource Plus  30 mL Oral BID BM   amLODipine  10 mg Oral Daily   atorvastatin  10 mg Oral Daily   busPIRone  10 mg Oral BID   calcium  acetate  667 mg Oral TID WC   Chlorhexidine Gluconate Cloth  6 each Topical Q0600   darbepoetin (ARANESP) injection - DIALYSIS  100 mcg Subcutaneous Q Mon-1800   feeding supplement (NEPRO CARB STEADY)  237 mL Oral TID BM   fluticasone furoate-vilanterol  1 puff Inhalation Daily   And   umeclidinium bromide  1 puff Inhalation Daily   gabapentin  100 mg Oral TID   heparin  5,000 Units Subcutaneous Q8H   leptospermum manuka honey  1 Application Topical Daily   multivitamin  1 tablet Oral QHS   nicotine  21 mg Transdermal Daily   pantoprazole  40 mg Oral Daily    Dialysis Orders: [facility]  3h  52kg  400/1.5   2/2 bath  RIJ TDC  Heparin none - last OP HD 6/27 - last inpt HD was 11/01/22 -> she never started the [facility] -  rocaltrol 0.5 mcg po three times per week - venofer 100mg  q hd 8/22- 9/07 - mircera 60 mcg IV q 2 wks, never started   Assessment/Plan: 1. Pulm edema: In setting of not going to HD for > 1 week. HD 8/26, improved. 2. ESRD: TTS schedule - for HD today. Her AVF was transposed 8/28 - appreciated VVS. Still using TDC until this heals (~1 mo). 3. HTN/volume: BP decent, LE edema is better. Re-establishing EDW. 4. Anemia of ESRD: Hgb very low on admit. Given 1U PRBCs 8/25. Continue Aranesp q Monday while here. 5. Secondary hyperparathyroidism: Ca/Phos much better - continue binders.  6. Nutrition: Alb low, continue supplements. 7. Homelessness: No permanent address or phone - arranging HD transportation is impossible for her at this moment, SW/CM involved. She is not interested in shelter.  Ozzie Hoyle, PA-C 11/14/2022, 10:12 AM  [facility]   Seen and examined independently.  Agree with note and exam as documented above by physician extender and as noted here.  They have come to get her for HD - she is going to urinate and then ok to go.  Appreciate RN assistance.    General adult female in bed in no acute distress; chronically  ill-appearing  HEENT normocephalic atraumatic extraocular movements intact sclera anicteric Neck supple trachea midline Lungs clear to auscultation bilaterally normal work of breathing at rest  Heart S1S2 no rub Abdomen soft nontender nondistended Extremities no edema; legs tender Psych she is frustrated. Access RIJ tunn catheter; RUE AVF bruit and thrill   ESRD - HD per TTS schedule   Homelessness - appreciate SW.  I have recommended a homeless shelter and she states that she would rather just sleep behind a building than have to get up at 6am.  Discussed safety issues with that plan.  Ultimately up to the patient     Right AVF in situ - s/p right 2nd stage BVT.  Appreciate vascular.      Anemia CKD - on ESA. PRBC's per primary team - would consider giving today if she is to be discharged today.     Disposition per primary team.  Appears shelter is most appropriate and patient does not wish to go to a shelter.  Now s/p right 2nd stage BVT with VVS.  Appreciate social work and primary team assistance.     Estanislado Emms, MD 11/14/2022  12:54 PM

## 2022-11-15 DIAGNOSIS — N186 End stage renal disease: Secondary | ICD-10-CM | POA: Diagnosis not present

## 2022-11-15 DIAGNOSIS — Z992 Dependence on renal dialysis: Secondary | ICD-10-CM | POA: Diagnosis not present

## 2022-11-15 LAB — BASIC METABOLIC PANEL
Anion gap: 11 (ref 5–15)
BUN: 24 mg/dL — ABNORMAL HIGH (ref 8–23)
CO2: 23 mmol/L (ref 22–32)
Calcium: 8.4 mg/dL — ABNORMAL LOW (ref 8.9–10.3)
Chloride: 99 mmol/L (ref 98–111)
Creatinine, Ser: 2.57 mg/dL — ABNORMAL HIGH (ref 0.44–1.00)
GFR, Estimated: 21 mL/min — ABNORMAL LOW (ref >60)
Glucose, Bld: 86 mg/dL (ref 70–99)
Potassium: 3.9 mmol/L (ref 3.5–5.1)
Sodium: 133 mmol/L — ABNORMAL LOW (ref 135–145)

## 2022-11-15 LAB — CBC
HCT: 22.5 % — ABNORMAL LOW (ref 36.0–46.0)
Hemoglobin: 7.2 g/dL — ABNORMAL LOW (ref 12.0–15.0)
MCH: 30.9 pg (ref 26.0–34.0)
MCHC: 32 g/dL (ref 30.0–36.0)
MCV: 96.6 fL (ref 80.0–100.0)
Platelets: 351 10*3/uL (ref 150–400)
RBC: 2.33 MIL/uL — ABNORMAL LOW (ref 3.87–5.11)
RDW: 19.8 % — ABNORMAL HIGH (ref 11.5–15.5)
WBC: 7 10*3/uL (ref 4.0–10.5)
nRBC: 1 % — ABNORMAL HIGH (ref 0.0–0.2)

## 2022-11-15 LAB — HEPATITIS B SURFACE ANTIGEN: Hepatitis B Surface Ag: NONREACTIVE

## 2022-11-15 MED ORDER — VANCOMYCIN HCL 500 MG/100ML IV SOLN
500.0000 mg | INTRAVENOUS | Status: DC
  Administered 2022-11-16: 500 mg via INTRAVENOUS
  Filled 2022-11-15 (×3): qty 100

## 2022-11-15 MED ORDER — CHLORHEXIDINE GLUCONATE CLOTH 2 % EX PADS
6.0000 | MEDICATED_PAD | Freq: Every day | CUTANEOUS | Status: DC
  Administered 2022-11-16 – 2022-11-18 (×3): 6 via TOPICAL

## 2022-11-15 MED ORDER — SENNOSIDES-DOCUSATE SODIUM 8.6-50 MG PO TABS
1.0000 | ORAL_TABLET | Freq: Two times a day (BID) | ORAL | Status: DC
  Administered 2022-11-15 – 2022-11-24 (×16): 1 via ORAL
  Filled 2022-11-15 (×19): qty 1

## 2022-11-15 MED ORDER — VANCOMYCIN HCL 1250 MG/250ML IV SOLN
1250.0000 mg | Freq: Once | INTRAVENOUS | Status: AC
  Administered 2022-11-15: 1250 mg via INTRAVENOUS
  Filled 2022-11-15 (×2): qty 250

## 2022-11-15 MED ORDER — FAMOTIDINE 20 MG PO TABS
20.0000 mg | ORAL_TABLET | Freq: Every day | ORAL | Status: DC
  Administered 2022-11-15 – 2022-11-24 (×10): 20 mg via ORAL
  Filled 2022-11-15 (×10): qty 1

## 2022-11-15 MED ORDER — SODIUM CHLORIDE 0.9 % IV SOLN: INTRAVENOUS | Status: DC | PRN

## 2022-11-15 NOTE — Progress Notes (Signed)
Pharmacy Antibiotic Note  [name] is a 62 y.o. female admitted on 11/09/2022 with cellulitis.  Pharmacy has been consulted for vancomycin dosing.  Pt is ESRD, last HD Thursday 8/29  Plan: Vancomycin 1250 mg IV x 1 now Then give vancomycin 500 mg q TTS after HD.  Height: 5\' 7"  (170.2 cm) Weight: 51.3 kg (113 lb 1.5 oz) IBW/kg (Calculated) : 61.6  Temp (24hrs), Avg:98.1 F (36.7 C), Min:97.6 F (36.4 C), Max:98.5 F (36.9 C)  Recent Labs  Lab 11/10/22 0326 11/11/22 0325 11/11/22 1150 11/12/22 0558 11/13/22 0403 11/14/22 0331 11/14/22 1125 11/15/22 0707  WBC 6.4  --  5.4  --   --  7.2 6.4 7.0  CREATININE 5.24*   < >  --  2.81* 3.70* 4.08* 3.92* 2.57*   < > = values in this interval not displayed.    Estimated Creatinine Clearance: 18.4 mL/min (A) (by C-G formula based on SCr of 2.57 mg/dL (H)).    Allergies  Allergen Reactions   Zestril [Lisinopril] Other (See Comments)    Hyperkalemia    Antimicrobials this admission: Cefazolin 8/28 > 8/30 Vancomycin 8/30 >   Dose adjustments this admission:  Microbiology results: none  Thank you for allowing pharmacy to be a part of this patient's care.  Reece Leader, Colon Flattery, [facility] Clinical Pharmacist  11/15/2022 9:39 AM   [facility] pharmacy phone numbers are listed on [email or web address]

## 2022-11-15 NOTE — Progress Notes (Signed)
Pt's case discussed with RN CM yesterday regarding pt's d/c plan and possible d/c date (in order to give an update to out-pt HD clinic). Likely plan is for pt to obtain a motel room once she has funds available possibly next week. This would allow pt to have an address for transportation to transport pt to/from HD appts. TOC staff aware of pt's transportation needs as well. Update provided to [facility] this morning. Will assist as needed.   Olivia Canter Renal Navigator [phone]

## 2022-11-15 NOTE — Progress Notes (Addendum)
[facility] Progress Note   Subjective:  Pt. Seen in room, states she is doing well medically but concerned about her plans post-discharge.  Pain and bruising in LUE improving.    Objective Vitals:   11/15/22 0520 11/15/22 0801 11/15/22 0850 11/15/22 1340  BP: (!) 98/45  (!) 104/53 (!) 97/58  Pulse:  87 85 89  Resp: 17 18 18 18   Temp: 98 F (36.7 C)  98.5 F (36.9 C) 98.5 F (36.9 C)  TempSrc: Oral  Oral Oral  SpO2:  92% 95% 99%  Weight: 51.3 kg     Height:       Physical Exam General: Well-appearing female sitting upright in chair with withdrawn affect.  Heart:RRR, no m/r/g Lungs: Clear to auscultation in bilateral posterior fields.  Abdomen: Soft, non-tender, non-distended.  Extremities: Trace BLE edema to the mid-shin. LUE with edema and pain in antecubital  Dialysis Access: R Permacath in good repair and cuff is without erythema, warmth, or edema. RUAAVF with clean and dry incision s/p transposition. Bruit and thrill are appreciable.   Additional Objective Labs: Basic Metabolic Panel: Recent Labs  Lab 11/12/22 0558 11/13/22 0403 11/14/22 0331 11/14/22 1125 11/15/22 0707  NA 137 135 136 134* 133*  K 3.7 4.1 4.6 5.1 3.9  CL 102 104 106 101 99  CO2 25 22 19* 19* 23  GLUCOSE 88 94 101* 104* 86  BUN 32* 48* 56* 56* 24*  CREATININE 2.81* 3.70* 4.08* 3.92* 2.57*  CALCIUM 8.0* 8.5* 8.4* 8.6* 8.4*  PHOS 4.5 4.5  --  4.2  --    Liver Function Tests: Recent Labs  Lab 11/09/22 2126 11/10/22 0326 11/11/22 0325 11/12/22 0558 11/13/22 0403 11/14/22 1125  AST 44* 28  --   --   --   --   ALT 25 21  --   --   --   --   ALKPHOS 97 87  --   --   --   --   BILITOT 0.5 0.6  --   --   --   --   PROT 6.6 5.9*  --   --   --   --   ALBUMIN 2.6* 2.2*   < > 2.1* 2.1* 2.1*   < > = values in this interval not displayed.   CBC: Recent Labs  Lab 11/09/22 2126 11/10/22 0326 11/10/22 1553 11/11/22 1150 11/14/22 0331 11/14/22 1125 11/15/22 0707  WBC 8.2  6.4  --  5.4 7.2 6.4 7.0  NEUTROABS 5.5 3.8  --   --   --   --   --   HGB 7.1* 6.3*   < > 8.1* 7.1* 7.5* 7.2*  HCT 22.5* 19.3*   < > 24.5* 22.6* 23.3* 22.5*  MCV 98.3 97.5  --  93.9 98.3 100.0 96.6  PLT 362 332  --  360 355 340 351   < > = values in this interval not displayed.   CBG: Recent Labs  Lab 11/10/22 0145 11/13/22 0658  GLUCAP 106* 85   Studies/Results: [place] LT UPPER EXTREM LTD SOFT TISSUE NON VASCULAR  Result Date: 11/14/2022 CLINICAL DATA:  IV site extravasation. EXAM: ULTRASOUND LEFT UPPER EXTREMITY LIMITED TECHNIQUE: Ultrasound examination of the upper extremity soft tissues was performed in the area of clinical concern. COMPARISON:  None Available. FINDINGS: Focused ultrasound of the left forearm demonstrates prominent subcutaneous edema and hypervascularity without focal fluid collection or mass. A superficial vein in this area appears thrombosed. IMPRESSION: 1. Findings suggestive of  left forearm superficial thrombophlebitis. Electronically Signed   By: Obie Dredge M.D.   On: 11/14/2022 11:41   Medications:  sodium chloride 10 mL/hr at 11/15/22 1141   [START ON 11/16/2022] vancomycin      (feeding supplement) PROSource Plus  30 mL Oral BID BM   atorvastatin  10 mg Oral Daily   busPIRone  10 mg Oral BID   calcium acetate  667 mg Oral TID WC   Chlorhexidine Gluconate Cloth  6 each Topical Q0600   darbepoetin (ARANESP) injection - DIALYSIS  100 mcg Subcutaneous Q Mon-1800   feeding supplement (NEPRO CARB STEADY)  237 mL Oral TID BM   fluticasone furoate-vilanterol  1 puff Inhalation Daily   And   umeclidinium bromide  1 puff Inhalation Daily   gabapentin  100 mg Oral TID   heparin  5,000 Units Subcutaneous Q8H   leptospermum manuka honey  1 Application Topical Daily   multivitamin  1 tablet Oral QHS   nicotine  21 mg Transdermal Daily   pantoprazole  40 mg Oral Daily   senna-docusate  1 tablet Oral BID    Dialysis Orders:  [facility]  3h  52kg  400/1.5    2/2 bath  RI[facility] TDC  Heparin none - last OP HD 6/27 - last inpt HD was 11/14/22 -> she never started at [facility] - rocaltrol 0.5 mcg po three times per week - venofer 100mg  q hd 8/22- 9/07 - mircera 60 mcg IV q 2 wks, never started    Assessment/Plan: ESRD: Volume status is improved and Electrolytes/Cr/BUN trending toward baseline. Pt. Is on inpatient TTS schedule and last dialyzed on 8/29. No issues secondary to dialysis treatment, and access site being utilized Designer, industrial/product). R Permacath being used until RAVF is able to be used. I am concerned about uremia, electrolyte derangement, and risk of catheter associated infection w/ persistent homelessness; close outpatient follow-up will be crucial once discharge to ensure optimal pt. Outcomes.  Counseled patient on importance of adherence to renal diet, fluid restriction, and adherence to dialysis treatments. Reinforced importance of low-sodium diet as outpatient to minimize swelling. L arm thrombophlebitis - Antibiotics transitioned to Vancomycin today.  Improvement in pain and bruising per patient.  Pulmonary Edema Secondary to Hypervolemia: Improved w/ dialysis and fluid management. Lungs clear to auscultation and pt does not complain of SOB/PND/Orthopnea.  Anemia of CKD: Hgb remains low (7.1), patient receiving weekly ESA. Fe has been d/c'd as of today. Continue current management.  Secondary Hyperparathyroidism: Ca/Phos remain improved. Continue binders. Counseled pt on low phosphorus diet.  Nutrition: Albumin remains low, patient on Nepro. Continue current management and trend values.  Homelessness: No permanent address, phone, or support system. She will not be able to maintain adherence to regimen or make appointments without transportation. She expresses that she will not be able to afford a motel room for very [place].   Santiago Bumpers, PA-S2  Progress note written with assistance by PA student.  I performed my own history and physical  exam.  Plan discussed with student and documented.   Virgina Norfolk, PA-C [facility]  11/15/2022, 2:33 PM  [facility]   Seen and examined independently.  Agree with note and exam as documented above by physician extender and as noted here.  General adult female in bed in no acute distress; chronically ill-appearing  HEENT normocephalic atraumatic extraocular movements intact sclera anicteric Neck supple trachea midline Lungs clear to auscultation bilaterally normal work of breathing  at rest on room air Heart S1S2 no rub Abdomen soft nontender nondistended Extremities no edema; legs tender Psych she is frustrated.  Access RIJ tunn catheter; RUE AVF bruit and thrill    ESRD - HD per TTS schedule   Homelessness - appreciate SW.  I have recommended a homeless shelter and she states that she would rather just sleep behind a building than have to get up at 6am.  Discussed safety issues with that plan.  Ultimately up to the patient.  She states that she gets her check on 9/3 and is getting a room at that time.      Right AVF in situ - s/p right 2nd stage BVT.  Appreciate vascular.      Anemia CKD - on ESA. PRBC's per primary team - would consider giving today if she is to be discharged today.     Disposition per primary team.  Appears shelter is most appropriate and patient does not wish to go to a shelter.  Now s/p right 2nd stage BVT with VVS.  Appreciate social work and primary team assistance.    Estanislado Emms, MD 11/15/2022  4:32 PM

## 2022-11-15 NOTE — Progress Notes (Signed)
Pt. BP soft (BP obtained in legs). On call for [facility] paged to make aware. Unable to obtain pts. BP in arms d/t swelling/surgery. Pt. Has hx of DVT in legs.

## 2022-11-15 NOTE — Progress Notes (Signed)
PROGRESS NOTE    [name]  WUJ:[id number] DOB: Mar 30, 1960 DOA: 11/09/2022 PCP: [name], MD  C2/F with ESRD on hemodialysis, chronic diastolic CHF, COPD, dyslipidemia presented to the ED with worsening lower extremity edema. -Recent admission 8/9-8/17 for sepsis from pneumonia complicated by renal failure, eventually required hemodialysis, since discharge was noncompliant with HD. -Volume overloaded on admission, with creatinine of 5.2, BUN 59, BNP 3 3, troponin 23, hemoglobin 7.1, chest x-ray noted bilateral interstitial infiltrates, right pleural effusion -8/23 hemoglobin down to 6.3, transfuse 1 unit PRBC 8/26 HD with ultrafiltration, 3 L -8/28 underwent second stage basilic vein fistula transposition Dr. Lenell Antu -8/29 developed thrombophlebitis in left arm   Subjective: Has severe pain in her left AC  Assessment and Plan:  End-stage renal disease on hemodialysis (HCC) Volume overload, noncompliance, hyponatremia -Did not do outpatient HD X 10 days prior to admission -Volume status improving, second HD today -Vascular surgery consulted for AV fistula, second step completed 8/28 -Disposition complicated by homelessness-no phone or permanent address, unable to have HD transportation in this setting, social work following, plans to go to a motel next week after she gets her check  Left arm thrombophlebitis, cellulitis -At site of previous IV, IV site changed -Continue all compresses, change Ancef to vancomycin  Anemia of chronic renal disease,  -sp one unit PRBC transfusion  Iron deficiency anemia, with serum iron 7, TIBC 200, transferrin saturation of 4 and ferritin 187.  SP IV iron 8/26, added EPO. -Hemoglobin 7.2 today, repeat iron tomorrow  Acute on chronic diastolic CHF (congestive heart failure) (HCC) Echocardiogram with preserved LV systolic function with EF 55 to 60%, RV systolic function preserved, mild aortic valve stenosis, RVSP 43.1 mmHg.  -Volume  management with HD  Essential hypertension -Improving with HD, continue amlodipine  History of COPD Stable Continue with bronchodilator therapy.   Hyperlipidemia Continue with statin therapy.   GAD (generalized anxiety disorder) Continue with hydroxyzine and buspirone.   Homelessness  Pressure injury of skin Present on admission.  Stage 2 pressure ulcer left buttock and coccyx.  Continue local skin care.   DVT prophylaxis: heparin SQ Code Status: Full COde Family Communication: None present Disposition Plan: Homeless  Consultants:    Procedures:   Antimicrobials:    Objective: Vitals:   11/15/22 0127 11/15/22 0520 11/15/22 0801 11/15/22 0850  BP: (!) 80/48 (!) 98/45  (!) 104/53  Pulse: 77  87 85  Resp: 15 17 18 18   Temp: 98 F (36.7 C) 98 F (36.7 C)  98.5 F (36.9 C)  TempSrc: Oral Oral  Oral  SpO2: 93%  92% 95%  Weight:  51.3 kg    Height:        Intake/Output Summary (Last 24 hours) at 11/15/2022 1010 Last data filed at 11/14/2022 2340 Gross per 24 hour  Intake 786.9 ml  Output 2000 ml  Net -1213.1 ml   Filed Weights   11/14/22 1319 11/14/22 1721 11/15/22 0520  Weight: 53.4 kg 51.3 kg 51.3 kg    Examination:  Chronically ill female laying in bed, AAOx3 HEENT: No JVD, right IJ HD catheter CVS: S1-S2, regular rhythm Lungs: Decreased breath sounds at the bases Abdomen: Soft, nontender, bowel sounds present Extremities no edema, right arm AV fistula, surgical scars, left forearm with thrombophlebitis   Data Reviewed:   CBC: Recent Labs  Lab 11/09/22 2126 11/10/22 0326 11/10/22 1553 11/11/22 1150 11/14/22 0331 11/14/22 1125 11/15/22 0707  WBC 8.2 6.4  --  5.4 7.2 6.4 7.0  NEUTROABS 5.5 3.8  --   --   --   --   --   HGB 7.1* 6.3* 7.3* 8.1* 7.1* 7.5* 7.2*  HCT 22.5* 19.3* 22.4* 24.5* 22.6* 23.3* 22.5*  MCV 98.3 97.5  --  93.9 98.3 100.0 96.6  PLT 362 332  --  360 355 340 351   Basic Metabolic Panel: Recent Labs  Lab  11/09/22 2126 11/10/22 0326 11/11/22 0325 11/12/22 0558 11/13/22 0403 11/14/22 0331 11/14/22 1125 11/15/22 0707  NA 135 132* 135 137 135 136 134* 133*  K 3.9 3.5 4.3 3.7 4.1 4.6 5.1 3.9  CL 99 102 102 102 104 106 101 99  CO2 16* 18* 18* 25 22 19* 19* 23  GLUCOSE 84 74 87 88 94 101* 104* 86  BUN 59* 56* 61* 32* 48* 56* 56* 24*  CREATININE 5.20* 5.24* 5.56* 2.81* 3.70* 4.08* 3.92* 2.57*  CALCIUM 7.5* 7.0* 7.2* 8.0* 8.5* 8.4* 8.6* 8.4*  MG 1.9 1.8  --   --   --   --   --   --   PHOS  --  8.4* 7.9* 4.5 4.5  --  4.2  --    GFR: Estimated Creatinine Clearance: 18.4 mL/min (A) (by C-G formula based on SCr of 2.57 mg/dL (H)). Liver Function Tests: Recent Labs  Lab 11/09/22 2126 11/10/22 0326 11/11/22 0325 11/12/22 0558 11/13/22 0403 11/14/22 1125  AST 44* 28  --   --   --   --   ALT 25 21  --   --   --   --   ALKPHOS 97 87  --   --   --   --   BILITOT 0.5 0.6  --   --   --   --   PROT 6.6 5.9*  --   --   --   --   ALBUMIN 2.6* 2.2* 2.1* 2.1* 2.1* 2.1*   No results for input(s): "LIPASE", "AMYLASE" in the last 168 hours. No results for input(s): "AMMONIA" in the last 168 hours. Coagulation Profile: Recent Labs  Lab 11/10/22 0326  INR 1.2   Cardiac Enzymes: No results for input(s): "CKTOTAL", "CKMB", "CKMBINDEX", "TROPONINI" in the last 168 hours. BNP (last 3 results) No results for input(s): "PROBNP" in the last 8760 hours. HbA1C: No results for input(s): "HGBA1C" in the last 72 hours. CBG: Recent Labs  Lab 11/10/22 0145 11/13/22 0658  GLUCAP 106* 85   Lipid Profile: No results for input(s): "CHOL", "HDL", "LDLCALC", "TRIG", "CHOLHDL", "LDLDIRECT" in the last 72 hours. Thyroid Function Tests: No results for input(s): "TSH", "T4TOTAL", "FREET4", "T3FREE", "THYROIDAB" in the last 72 hours. Anemia Panel: No results for input(s): "VITAMINB12", "FOLATE", "FERRITIN", "TIBC", "IRON", "RETICCTPCT" in the last 72 hours. Urine analysis:    Component Value Date/Time    COLORURINE YELLOW 10/25/2022 2015   APPEARANCEUR HAZY (A) 10/25/2022 2015   LABSPEC 1.009 10/25/2022 2015   PHURINE 5.0 10/25/2022 2015   GLUCOSEU 50 (A) 10/25/2022 2015   HGBUR LARGE (A) 10/25/2022 2015   BILIRUBINUR NEGATIVE 10/25/2022 2015   KETONESUR NEGATIVE 10/25/2022 2015   PROTEINUR 100 (A) 10/25/2022 2015   UROBILINOGEN 0.2 03/02/2010 2027   NITRITE POSITIVE (A) 10/25/2022 2015   LEUKOCYTESUR MODERATE (A) 10/25/2022 2015   Sepsis Labs: @LABRCNTIP (procalcitonin:4,lacticidven:4)  )No results found for this or any previous visit (from the past 240 hour(s)).   Radiology Studies: [place] LT UPPER EXTREM LTD SOFT TISSUE NON VASCULAR  Result Date: 11/14/2022 CLINICAL DATA:  IV site extravasation.  EXAM: ULTRASOUND LEFT UPPER EXTREMITY LIMITED TECHNIQUE: Ultrasound examination of the upper extremity soft tissues was performed in the area of clinical concern. COMPARISON:  None Available. FINDINGS: Focused ultrasound of the left forearm demonstrates prominent subcutaneous edema and hypervascularity without focal fluid collection or mass. A superficial vein in this area appears thrombosed. IMPRESSION: 1. Findings suggestive of left forearm superficial thrombophlebitis. Electronically Signed   By: Obie Dredge M.D.   On: 11/14/2022 11:41     Scheduled Meds:  (feeding supplement) PROSource Plus  30 mL Oral BID BM   atorvastatin  10 mg Oral Daily   busPIRone  10 mg Oral BID   calcium acetate  667 mg Oral TID WC   Chlorhexidine Gluconate Cloth  6 each Topical Q0600   darbepoetin (ARANESP) injection - DIALYSIS  100 mcg Subcutaneous Q Mon-1800   feeding supplement (NEPRO CARB STEADY)  237 mL Oral TID BM   fluticasone furoate-vilanterol  1 puff Inhalation Daily   And   umeclidinium bromide  1 puff Inhalation Daily   gabapentin  100 mg Oral TID   heparin  5,000 Units Subcutaneous Q8H   leptospermum manuka honey  1 Application Topical Daily   multivitamin  1 tablet Oral QHS   nicotine  21 mg  Transdermal Daily   pantoprazole  40 mg Oral Daily   senna-docusate  1 tablet Oral BID   Continuous Infusions:  vancomycin     [START ON 11/16/2022] vancomycin       LOS: 5 days    Time spent:    Zannie Cove, MD [facility]   11/15/2022, 10:10 AM

## 2022-11-15 NOTE — Progress Notes (Signed)
Physical Therapy Treatment Patient Details Name: [name] MRN: [id number] DOB: October 08, 1960 Today's Date: 11/15/2022   History of Present Illness Pt is a 62 y/o F presenting to ED on 8.24 with worsening LE edema, admitted for ESRD on HD. s/p R 2nd stage basilic vein fistula on 8/28. recent hospitalization 8/09-8/17 for sepsis 2/2 PNA complicated renal failure that required renal replacement therapy. PMH includes ESRD on HD, COPD, heart failure, HLD    PT Comments  Pt not eager to get OOB, initially declines PT but then agreeable when wanting to get OOB to eat. Pt tolerating stand pivot level mobility only this date given fatigue and bilat UE pain, LUE hurting from IV and RUE from fistula surgery on 8/28.  Pt declined further intervention once in recliner. PT to progress pt mobility as able, PT to continue to follow.     If plan is discharge home, recommend the following: Assist for transportation;Help with stairs or ramp for entrance;A little help with walking and/or transfers   Can travel by private vehicle        Equipment Recommendations  None recommended by PT    Recommendations for Other Services       Precautions / Restrictions Precautions Precautions: Fall Precaution Comments: incision medial r arm, very tender Restrictions Weight Bearing Restrictions: No     Mobility  Bed Mobility Overal bed mobility: Needs Assistance Bed Mobility: Supine to Sit     Supine to sit: Supervision, HOB elevated, Used rails          Transfers Overall transfer level: Needs assistance Equipment used: 1 person hand held assist Transfers: Sit to/from Stand, Bed to chair/wheelchair/BSC Sit to Stand: Contact guard assist   Step pivot transfers: Contact guard assist       General transfer comment: CGA for safety, pt mildly unsteady but pt corrects    Ambulation/Gait               General Gait Details: nt - pt declines   Stairs             Wheelchair Mobility      Tilt Bed    Modified Rankin (Stroke Patients Only)       Balance Overall balance assessment: Needs assistance Sitting-balance support: Bilateral upper extremity supported Sitting balance-Leahy Scale: Good     Standing balance support: No upper extremity supported Standing balance-Leahy Scale: Good                              Cognition Arousal: Alert Behavior During Therapy: WFL for tasks assessed/performed Overall Cognitive Status: No family/caregiver present to determine baseline cognitive functioning                                 General Comments: pt is difficult to motivate to participate, states "I know the game better than you" when PT asking pt to mobilize. Pt lacks insight into deficits        Exercises General Exercises - Lower Extremity Tom Arc Quad: AROM, Both, 10 reps, Seated    General Comments        Pertinent Vitals/Pain Pain Assessment Pain Assessment: Faces Faces Pain Scale: Hurts little more Pain Location: RUE, LUE Pain Descriptors / Indicators: Discomfort, Grimacing, Guarding Pain Intervention(s): Limited activity within patient's tolerance, Monitored during session, Repositioned    Home Living  Prior Function            PT Goals (current goals can now be found in the care plan section) Acute Rehab PT Goals PT Goal Formulation: With patient Time For Goal Achievement: 11/26/22 Potential to Achieve Goals: Good Progress towards PT goals: Progressing toward goals    Frequency    Min 1X/week      PT Plan      Co-evaluation              AM-PAC PT "6 Clicks" Mobility   Outcome Measure  Help needed turning from your back to your side while in a flat bed without using bedrails?: A Little Help needed moving from lying on your back to sitting on the side of a flat bed without using bedrails?: A Little Help needed moving to and from a bed to a chair (including a  wheelchair)?: A Little Help needed standing up from a chair using your arms (e.g., wheelchair or bedside chair)?: A Little Help needed to walk in hospital room?: A Little Help needed climbing 3-5 steps with a railing? : A Little 6 Click Score: 18    End of Session   Activity Tolerance: Patient limited by fatigue Patient left: with call bell/phone within reach;in chair;with chair alarm set Nurse Communication: Mobility status PT Visit Diagnosis: Muscle weakness (generalized) (M62.81)     Time: 1610-9604 PT Time Calculation (min) (ACUTE ONLY): 13 min  Charges:    $Therapeutic Activity: 8-22 mins PT General Charges $$ ACUTE PT VISIT: 1 Visit                     Marye Round, PT DPT Acute Rehabilitation Services Secure Chat Preferred  Office [phone]    [name] 11/15/2022, 1:39 PM

## 2022-11-16 DIAGNOSIS — Z992 Dependence on renal dialysis: Secondary | ICD-10-CM | POA: Diagnosis not present

## 2022-11-16 DIAGNOSIS — N186 End stage renal disease: Secondary | ICD-10-CM | POA: Diagnosis not present

## 2022-11-16 LAB — CBC
HCT: 23.4 % — ABNORMAL LOW (ref 36.0–46.0)
Hemoglobin: 7.3 g/dL — ABNORMAL LOW (ref 12.0–15.0)
MCH: 31.7 pg (ref 26.0–34.0)
MCHC: 31.2 g/dL (ref 30.0–36.0)
MCV: 101.7 fL — ABNORMAL HIGH (ref 80.0–100.0)
Platelets: 368 10*3/uL (ref 150–400)
RBC: 2.3 MIL/uL — ABNORMAL LOW (ref 3.87–5.11)
RDW: 20.9 % — ABNORMAL HIGH (ref 11.5–15.5)
WBC: 6 10*3/uL (ref 4.0–10.5)
nRBC: 1 % — ABNORMAL HIGH (ref 0.0–0.2)

## 2022-11-16 LAB — RENAL FUNCTION PANEL
Albumin: 2.2 g/dL — ABNORMAL LOW (ref 3.5–5.0)
Anion gap: 9 (ref 5–15)
BUN: 40 mg/dL — ABNORMAL HIGH (ref 8–23)
CO2: 24 mmol/L (ref 22–32)
Calcium: 8.9 mg/dL (ref 8.9–10.3)
Chloride: 99 mmol/L (ref 98–111)
Creatinine, Ser: 3.41 mg/dL — ABNORMAL HIGH (ref 0.44–1.00)
GFR, Estimated: 15 mL/min — ABNORMAL LOW (ref >60)
Glucose, Bld: 130 mg/dL — ABNORMAL HIGH (ref 70–99)
Phosphorus: 3.6 mg/dL (ref 2.5–4.6)
Potassium: 3.9 mmol/L (ref 3.5–5.1)
Sodium: 132 mmol/L — ABNORMAL LOW (ref 135–145)

## 2022-11-16 LAB — HEPATITIS B SURFACE ANTIBODY, QUANTITATIVE: Hep B S AB Quant (Post): <3.5 m[IU]/mL — ABNORMAL LOW

## 2022-11-16 MED ORDER — IRON SUCROSE 500 MG IVPB - SIMPLE MED
500.0000 mg | Freq: Once | INTRAVENOUS | Status: AC
  Administered 2022-11-16: 500 mg via INTRAVENOUS
  Filled 2022-11-16: qty 275

## 2022-11-16 MED ORDER — SODIUM CHLORIDE 0.9 % IV SOLN: 510.0000 mg | Freq: Once | INTRAVENOUS | Status: DC

## 2022-11-16 MED ORDER — HEPARIN SODIUM (PORCINE) 1000 UNIT/ML IJ SOLN
INTRAMUSCULAR | Status: AC
  Administered 2022-11-16: 1000 [IU]
  Filled 2022-11-16: qty 4

## 2022-11-16 NOTE — Procedures (Signed)
Seen and examined on dialysis.  Procedure supervised.  Blood pressure 123/52 and HR 72.  Tolerating goal.  RIJ tunn catheter in use.  Will follow up her labs.  No labs needed tomorrow from a renal standpoint  Estanislado Emms, MD 11/16/2022  9:12 AM

## 2022-11-16 NOTE — Progress Notes (Signed)
PROGRESS NOTE    [name]  ZOX:[id number] DOB: [birth year] DOA: 11/09/2022 PCP: [name], MD  C2/F with ESRD on hemodialysis, chronic diastolic CHF, COPD, dyslipidemia presented to the ED with worsening lower extremity edema. -Recent admission 8/9-8/17 for sepsis from pneumonia complicated by renal failure, eventually required hemodialysis, since discharge was noncompliant with HD. -Volume overloaded on admission, with creatinine of 5.2, BUN 59, BNP 3 3, troponin 23, hemoglobin 7.1, chest x-ray noted bilateral interstitial infiltrates, right pleural effusion -8/23 hemoglobin down to 6.3, transfuse 1 unit PRBC 8/26 HD with ultrafiltration, 3 L -8/28 underwent second stage basilic vein fistula transposition Dr. Lenell Antu -8/29 developed thrombophlebitis in left arm -8/30, antibiotics changed to vancomycin   Subjective: Continues to have significant pain in her left AC  Assessment and Plan:  End-stage renal disease on hemodialysis (HCC) Volume overload, noncompliance, hyponatremia -Did not do outpatient HD X 10 days prior to admission -Volume status improved -Vascular surgery consulted for AV fistula, second step completed 8/28 -Disposition complicated by homelessness-no phone or permanent address, unable to have HD transportation in this setting, social work following, she plans to go to a motel next week after she gets her check  Severe left arm thrombophlebitis, cellulitis -At site of previous IV, IV site changed -Continue cold compresses, continue vancomycin -Encouraged elevation  Anemia of chronic renal disease,  -sp one unit PRBC transfusion  Iron deficiency anemia, with serum iron 7, TIBC 200, transferrin saturation of 4 and ferritin 187.  SP IV iron 8/26, added EPO. -Hemoglobin 7.3 today, will repeat IV iron  Acute on chronic diastolic CHF (congestive heart failure) (HCC) Echocardiogram with preserved LV systolic function with EF 55 to 60%, RV systolic function  preserved, mild aortic valve stenosis, RVSP 43.1 mmHg.  -Volume management with HD  Essential hypertension -Improving with HD, continue amlodipine  History of COPD Stable Continue with bronchodilator therapy.   Hyperlipidemia Continue with statin therapy.   GAD (generalized anxiety disorder) Continue with hydroxyzine and buspirone.   Homelessness  Pressure injury of skin Present on admission.  Stage 2 pressure ulcer left buttock and coccyx.  Continue local skin care.   DVT prophylaxis: heparin SQ Code Status: Full COde Family Communication: None present Disposition Plan: Homeless  Consultants:    Procedures:   Antimicrobials:    Objective: Vitals:   11/16/22 0930 11/16/22 1000 11/16/22 1030 11/16/22 1100  BP: (!) 101/46 112/62 110/60 (!) 113/57  Pulse: 73 80 80 73  Resp: 20 18 18 18   Temp:      TempSrc:      SpO2:      Weight:      Height:        Intake/Output Summary (Last 24 hours) at 11/16/2022 1103 Last data filed at 11/16/2022 0900 Gross per 24 hour  Intake 653.48 ml  Output 2725 ml  Net -2071.52 ml   Filed Weights   11/15/22 0520 11/16/22 0524 11/16/22 0912  Weight: 51.3 kg 51.3 kg 52 kg    Examination:  Chronically ill female laying in bed, AAOx3 HEENT: No JVD, right IJ HD catheter CVS: S1-S2, regular rhythm Lungs: Decreased breath sounds at the bases Abdomen: Soft, nontender, bowel sounds present Extremities no edema, right arm AV fistula, surgical scars, left forearm with thrombophlebitis   Data Reviewed:   CBC: Recent Labs  Lab 11/09/22 2126 11/10/22 0326 11/10/22 1553 11/11/22 1150 11/14/22 0331 11/14/22 1125 11/15/22 0707 11/16/22 0900  WBC 8.2 6.4  --  5.4 7.2 6.4 7.0 6.0  NEUTROABS 5.5 3.8  --   --   --   --   --   --   HGB 7.1* 6.3*   < > 8.1* 7.1* 7.5* 7.2* 7.3*  HCT 22.5* 19.3*   < > 24.5* 22.6* 23.3* 22.5* 23.4*  MCV 98.3 97.5  --  93.9 98.3 100.0 96.6 101.7*  PLT 362 332  --  360 355 340 351 368   < > = values  in this interval not displayed.   Basic Metabolic Panel: Recent Labs  Lab 11/09/22 2126 11/09/22 2126 11/10/22 0326 11/11/22 0325 11/12/22 0558 11/13/22 0403 11/14/22 0331 11/14/22 1125 11/15/22 0707 11/16/22 0900  NA 135  --  132* 135 137 135 136 134* 133* 132*  K 3.9  --  3.5 4.3 3.7 4.1 4.6 5.1 3.9 3.9  CL 99  --  102 102 102 104 106 101 99 99  CO2 16*  --  18* 18* 25 22 19* 19* 23 24  GLUCOSE 84  --  74 87 88 94 101* 104* 86 130*  BUN 59*  --  56* 61* 32* 48* 56* 56* 24* 40*  CREATININE 5.20*  --  5.24* 5.56* 2.81* 3.70* 4.08* 3.92* 2.57* 3.41*  CALCIUM 7.5*  --  7.0* 7.2* 8.0* 8.5* 8.4* 8.6* 8.4* 8.9  MG 1.9  --  1.8  --   --   --   --   --   --   --   PHOS  --    < > 8.4* 7.9* 4.5 4.5  --  4.2  --  3.6   < > = values in this interval not displayed.   GFR: Estimated Creatinine Clearance: 14 mL/min (A) (by C-G formula based on SCr of 3.41 mg/dL (H)). Liver Function Tests: Recent Labs  Lab 11/09/22 2126 11/10/22 0326 11/11/22 0325 11/12/22 0558 11/13/22 0403 11/14/22 1125 11/16/22 0900  AST 44* 28  --   --   --   --   --   ALT 25 21  --   --   --   --   --   ALKPHOS 97 87  --   --   --   --   --   BILITOT 0.5 0.6  --   --   --   --   --   PROT 6.6 5.9*  --   --   --   --   --   ALBUMIN 2.6* 2.2* 2.1* 2.1* 2.1* 2.1* 2.2*   No results for input(s): "LIPASE", "AMYLASE" in the last 168 hours. No results for input(s): "AMMONIA" in the last 168 hours. Coagulation Profile: Recent Labs  Lab 11/10/22 0326  INR 1.2   Cardiac Enzymes: No results for input(s): "CKTOTAL", "CKMB", "CKMBINDEX", "TROPONINI" in the last 168 hours. BNP (last 3 results) No results for input(s): "PROBNP" in the last 8760 hours. HbA1C: No results for input(s): "HGBA1C" in the last 72 hours. CBG: Recent Labs  Lab 11/10/22 0145 11/13/22 0658  GLUCAP 106* 85   Lipid Profile: No results for input(s): "CHOL", "HDL", "LDLCALC", "TRIG", "CHOLHDL", "LDLDIRECT" in the last 72 hours. Thyroid  Function Tests: No results for input(s): "TSH", "T4TOTAL", "FREET4", "T3FREE", "THYROIDAB" in the last 72 hours. Anemia Panel: No results for input(s): "VITAMINB12", "FOLATE", "FERRITIN", "TIBC", "IRON", "RETICCTPCT" in the last 72 hours. Urine analysis:    Component Value Date/Time   COLORURINE YELLOW 10/25/2022 2015   APPEARANCEUR HAZY (A) 10/25/2022 2015   LABSPEC 1.009 10/25/2022 2015  PHURINE 5.0 10/25/2022 2015   GLUCOSEU 50 (A) 10/25/2022 2015   HGBUR LARGE (A) 10/25/2022 2015   BILIRUBINUR NEGATIVE 10/25/2022 2015   KETONESUR NEGATIVE 10/25/2022 2015   PROTEINUR 100 (A) 10/25/2022 2015   UROBILINOGEN 0.2 03/02/2010 2027   NITRITE POSITIVE (A) 10/25/2022 2015   LEUKOCYTESUR MODERATE (A) 10/25/2022 2015   Sepsis Labs: @LABRCNTIP (procalcitonin:4,lacticidven:4)  )No results found for this or any previous visit (from the past 240 hour(s)).   Radiology Studies: No results found.   Scheduled Meds:  (feeding supplement) PROSource Plus  30 mL Oral BID BM   atorvastatin  10 mg Oral Daily   busPIRone  10 mg Oral BID   calcium acetate  667 mg Oral TID WC   Chlorhexidine Gluconate Cloth  6 each Topical Q0600   Chlorhexidine Gluconate Cloth  6 each Topical Q0600   darbepoetin (ARANESP) injection - DIALYSIS  100 mcg Subcutaneous Q Mon-1800   famotidine  20 mg Oral Daily   feeding supplement (NEPRO CARB STEADY)  237 mL Oral TID BM   fluticasone furoate-vilanterol  1 puff Inhalation Daily   And   umeclidinium bromide  1 puff Inhalation Daily   gabapentin  100 mg Oral TID   heparin  5,000 Units Subcutaneous Q8H   heparin sodium (porcine)       leptospermum manuka honey  1 Application Topical Daily   multivitamin  1 tablet Oral QHS   nicotine  21 mg Transdermal Daily   pantoprazole  40 mg Oral Daily   senna-docusate  1 tablet Oral BID   Continuous Infusions:  sodium chloride 10 mL/hr at 11/15/22 1832   vancomycin       LOS: 6 days    Time spent:    Zannie Cove, MD [facility]   11/16/2022, 11:03 AM

## 2022-11-16 NOTE — Progress Notes (Signed)
   11/16/22 1252  Vitals  Temp 98 F (36.7 C)  Temp Source Oral  BP 124/61  BP Location Left Leg  BP Method Automatic  Patient Position (if appropriate) Lying  Pulse Rate 72  Resp 20  Oxygen Therapy  SpO2 100 %  O2 Device Room Air  During Treatment Monitoring  Intra-Hemodialysis Comments Tx completed  Post Treatment  Dialyzer Clearance Lightly streaked  Hemodialysis Intake (mL) 0 mL  Liters Processed 84  Fluid Removed (mL) 1000 mL  Tolerated HD Treatment Yes  Post-Hemodialysis Comments Pt goal met  Hemodialysis Catheter Right Internal jugular Double lumen Permanent (Tunneled)  Placement Date/Time: 08/14/22 1144   Placed prior to admission: No  Serial / Lot #: [id number]  Expiration Date: 01/22/27  Time Out: Correct patient;Correct site;Correct procedure  Maximum sterile barrier precautions: Hand hygiene;Cap;Mask;Sterile gow...  Site Condition No complications  Blue Lumen Status Heparin locked  Red Lumen Status Heparin locked  Catheter fill solution Heparin 1000 units/ml  Catheter fill volume (Arterial) 1.6 cc  Catheter fill volume (Venous) 1.6  Dressing Type Transparent  Dressing Status Antimicrobial disc in place;Clean, Dry, Intact  Interventions New dressing  Drainage Description None  Post treatment catheter status Capped and Clamped

## 2022-11-16 NOTE — Progress Notes (Addendum)
[facility] Progress Note   Subjective:   Patient seen and examined at bedside in dialysis.  Tolerating treatment well so far.  Reports nausea this AM that is now resolved.  Denies vomiting, diarrhea, fever, chills, CP and SOB.   Objective Vitals:   11/16/22 0930 11/16/22 1000 11/16/22 1030 11/16/22 1100  BP: (!) 101/46 112/62 110/60 (!) 113/57  Pulse: 73 80 80 73  Resp: 20 18 18 18   Temp:      TempSrc:      SpO2:      Weight:      Height:       Physical Exam General:chronically ill appearing female in NAD Heart:RRR, no mrg Lungs:CTAB, nml WOB on RA Abdomen:soft, NTND Extremities:no LE edema, +LUE edema/warmth/erythema in antecubital fossa, +RUE edema around incision Dialysis Access: [facility], RU AVF maturing +b/t   Filed Weights   11/15/22 0520 11/16/22 0524 11/16/22 0912  Weight: 51.3 kg 51.3 kg 52 kg    Intake/Output Summary (Last 24 hours) at 11/16/2022 1102 Last data filed at 11/16/2022 0900 Gross per 24 hour  Intake 653.48 ml  Output 2725 ml  Net -2071.52 ml    Additional Objective Labs: Basic Metabolic Panel: Recent Labs  Lab 11/13/22 0403 11/14/22 0331 11/14/22 1125 11/15/22 0707 11/16/22 0900  NA 135   < > 134* 133* 132*  K 4.1   < > 5.1 3.9 3.9  CL 104   < > 101 99 99  CO2 22   < > 19* 23 24  GLUCOSE 94   < > 104* 86 130*  BUN 48*   < > 56* 24* 40*  CREATININE 3.70*   < > 3.92* 2.57* 3.41*  CALCIUM 8.5*   < > 8.6* 8.4* 8.9  PHOS 4.5  --  4.2  --  3.6   < > = values in this interval not displayed.   Liver Function Tests: Recent Labs  Lab 11/09/22 2126 11/10/22 0326 11/11/22 0325 11/13/22 0403 11/14/22 1125 11/16/22 0900  AST 44* 28  --   --   --   --   ALT 25 21  --   --   --   --   ALKPHOS 97 87  --   --   --   --   BILITOT 0.5 0.6  --   --   --   --   PROT 6.6 5.9*  --   --   --   --   ALBUMIN 2.6* 2.2*   < > 2.1* 2.1* 2.2*   < > = values in this interval not displayed.   CBC: Recent Labs  Lab 11/09/22 2126  11/10/22 0326 11/10/22 1553 11/11/22 1150 11/14/22 0331 11/14/22 1125 11/15/22 0707 11/16/22 0900  WBC 8.2 6.4  --  5.4 7.2 6.4 7.0 6.0  NEUTROABS 5.5 3.8  --   --   --   --   --   --   HGB 7.1* 6.3*   < > 8.1* 7.1* 7.5* 7.2* 7.3*  HCT 22.5* 19.3*   < > 24.5* 22.6* 23.3* 22.5* 23.4*  MCV 98.3 97.5  --  93.9 98.3 100.0 96.6 101.7*  PLT 362 332  --  360 355 340 351 368   < > = values in this interval not displayed.   Blood Culture    Component Value Date/Time   SDES URINE, CLEAN CATCH 10/25/2022 2015   SPECREQUEST  10/25/2022 2015    NONE Reflexed from [id number] Performed at [facility], [address], [place], [place] [address]    CULT (A) 10/25/2022 2015    >=100,000 COLONIES/mL KLEBSIELLA PNEUMONIAE Confirmed Extended Spectrum Beta-Lactamase Producer (ESBL).  In bloodstream infections from ESBL organisms, carbapenems are preferred over piperacillin/tazobactam. They are shown to have a lower risk of mortality.    REPTSTATUS 10/27/2022 FINAL 10/25/2022 2015    CBG: Recent Labs  Lab 11/10/22 0145 11/13/22 0658  GLUCAP 106* 85   Medications:  sodium chloride 10 mL/hr at 11/15/22 1832   vancomycin      (feeding supplement) PROSource Plus  30 mL Oral BID BM   atorvastatin  10 mg Oral Daily   busPIRone  10 mg Oral BID   calcium acetate  667 mg Oral TID WC   Chlorhexidine Gluconate Cloth  6 each Topical Q0600   Chlorhexidine Gluconate Cloth  6 each Topical Q0600   darbepoetin (ARANESP) injection - DIALYSIS  100 mcg Subcutaneous Q Mon-1800   famotidine  20 mg Oral Daily   feeding supplement (NEPRO CARB STEADY)  237 mL Oral TID BM   fluticasone furoate-vilanterol  1 puff Inhalation Daily   And   umeclidinium bromide  1 puff Inhalation Daily   gabapentin  100 mg Oral TID   heparin  5,000 Units Subcutaneous Q8H   heparin sodium (porcine)       leptospermum manuka honey  1 Application Topical Daily   multivitamin  1 tablet Oral QHS   nicotine  21 mg Transdermal  Daily   pantoprazole  40 mg Oral Daily   senna-docusate  1 tablet Oral BID    Dialysis Orders: [facility]  3h  52kg  400/1.5   2/2 bath  [facility] TDC  Heparin none - last OP HD 6/27 - last inpt HD was 11/14/22 -> she never started at [facility] - rocaltrol 0.5 mcg po three times per week - venofer 100mg  q hd 8/22- 9/07 - mircera 60 mcg IV q 2 wks, never started  Assessment/Plan: 1. Pulm edema: In setting of not going to HD for > 1 week. Resolved with HD.  2. L arm thrombophlebitis - on Vancomycin. Appears stable.  2. ESRD: TTS schedule - for HD today. Her AVF was transposed 8/28 - appreciated VVS. Still using TDC until this heals (~1 mo). 3. HTN/volume: BP soft stable.  LE edema improved.  Continue UF as tolerated.  4. Anemia of ESRD: Hgb very low on admit. Given 1U PRBCs 8/25. Now 7.3. Continue Aranesp q Monday while here. 5. Secondary hyperparathyroidism: Calcium and phos in goal.  Continue binders and VDRA. 6. Nutrition: Alb low, continue supplements. Renal diet w/fluid restrictions.  7. Homelessness: No permanent address or phone - arranging HD transportation is impossible for her at this moment, SW/CM involved. She is not interested in shelter.  Per PMD.   Virgina Norfolk, PA-C [facility] 11/16/2022,11:02 AM  LOS: 6 days    Seen and examined independently.  Agree with note and exam as documented above by physician extender and as noted here.  Please see procedure note from today  Estanislado Emms, MD 11/16/2022  1:41 PM

## 2022-11-17 DIAGNOSIS — N186 End stage renal disease: Secondary | ICD-10-CM | POA: Diagnosis not present

## 2022-11-17 DIAGNOSIS — Z992 Dependence on renal dialysis: Secondary | ICD-10-CM | POA: Diagnosis not present

## 2022-11-17 LAB — CBC
HCT: 24.8 % — ABNORMAL LOW (ref 36.0–46.0)
Hemoglobin: 7.8 g/dL — ABNORMAL LOW (ref 12.0–15.0)
MCH: 31.7 pg (ref 26.0–34.0)
MCHC: 31.5 g/dL (ref 30.0–36.0)
MCV: 100.8 fL — ABNORMAL HIGH (ref 80.0–100.0)
Platelets: 398 10*3/uL (ref 150–400)
RBC: 2.46 MIL/uL — ABNORMAL LOW (ref 3.87–5.11)
RDW: 21.2 % — ABNORMAL HIGH (ref 11.5–15.5)
WBC: 5.9 10*3/uL (ref 4.0–10.5)
nRBC: 0.5 % — ABNORMAL HIGH (ref 0.0–0.2)

## 2022-11-17 LAB — BASIC METABOLIC PANEL
Anion gap: 15 (ref 5–15)
BUN: 22 mg/dL (ref 8–23)
CO2: 23 mmol/L (ref 22–32)
Calcium: 9 mg/dL (ref 8.9–10.3)
Chloride: 97 mmol/L — ABNORMAL LOW (ref 98–111)
Creatinine, Ser: 2.3 mg/dL — ABNORMAL HIGH (ref 0.44–1.00)
GFR, Estimated: 23 mL/min — ABNORMAL LOW (ref >60)
Glucose, Bld: 89 mg/dL (ref 70–99)
Potassium: 4.4 mmol/L (ref 3.5–5.1)
Sodium: 135 mmol/L (ref 135–145)

## 2022-11-17 MED ORDER — DARBEPOETIN ALFA 150 MCG/0.3ML IJ SOSY
150.0000 ug | PREFILLED_SYRINGE | INTRAMUSCULAR | Status: DC
  Administered 2022-11-18: 150 ug via SUBCUTANEOUS
  Filled 2022-11-17: qty 0.3

## 2022-11-17 NOTE — Progress Notes (Signed)
PROGRESS NOTE    [name]  NWG:[id number] DOB: 1960-03-30 DOA: 11/09/2022 PCP: [name], MD  C2/F with ESRD on hemodialysis, chronic diastolic CHF, COPD, dyslipidemia presented to the ED with worsening lower extremity edema. -Recent admission 8/9-8/17 for sepsis from pneumonia complicated by renal failure, eventually required hemodialysis, since discharge was noncompliant with HD. -Volume overloaded on admission, with creatinine of 5.2, BUN 59, BNP 3 3, troponin 23, hemoglobin 7.1, chest x-ray noted bilateral interstitial infiltrates, right pleural effusion -8/23 hemoglobin down to 6.3, transfuse 1 unit PRBC 8/26 HD with ultrafiltration, 3 L -8/28 underwent second stage basilic vein fistula transposition Dr. Lenell Antu -8/29 developed thrombophlebitis in left arm -8/30, antibiotics changed to vancomycin   Subjective: Complains of pain in her right and left forearm  Assessment and Plan:  End-stage renal disease on hemodialysis (HCC) Volume overload, noncompliance, hyponatremia -Did not do outpatient HD X 10 days prior to admission -Volume status improved -Vascular surgery consulted for AV fistula, second step completed 8/28 -Disposition complicated by homelessness-no phone or permanent address, unable to have HD transportation in this setting, social work following, she plans to go to a motel next week after she gets her check on tuesday  Severe left arm thrombophlebitis, cellulitis -At site of previous IV, IV site changed -Continue cold compresses, continue vancomycin day 3 -Encouraged elevation -mild improvement today  Anemia of chronic renal disease,  -sp one unit PRBC transfusion  Iron deficiency anemia, with serum iron 7, TIBC 200, transferrin saturation of 4 and ferritin 187.  SP IV iron 8/26, 8/31 , added EPO. -Hemoglobin 7.8 today  Acute on chronic diastolic CHF (congestive heart failure) (HCC) Echocardiogram with preserved LV systolic function with EF 55 to  60%, RV systolic function preserved, mild aortic valve stenosis, RVSP 43.1 mmHg.  -Volume management with HD  Essential hypertension -Improving with HD, continue amlodipine  History of COPD Stable Continue with bronchodilator therapy.   Hyperlipidemia Continue with statin therapy.   GAD (generalized anxiety disorder) Continue with hydroxyzine and buspirone.   Homelessness  Pressure injury of skin Present on admission.  Stage 2 pressure ulcer left buttock and coccyx.  Continue local skin care.   DVT prophylaxis: heparin SQ Code Status: Full COde Family Communication: None present Disposition Plan: Homeless  Consultants:    Procedures:   Antimicrobials:    Objective: Vitals:   11/16/22 1951 11/17/22 0059 11/17/22 0607 11/17/22 0800  BP: (!) 102/54 (!) 99/53 (!) (P) 108/51 (!) 138/57  Pulse: 71 77 (P) 82   Resp: 18 18 (P) 18 16  Temp: 98.6 F (37 C) 98.6 F (37 C) (P) 98.5 F (36.9 C) 98.2 F (36.8 C)  TempSrc: Oral Oral (P) Oral Oral  SpO2: 92% 94% (P) 96% 96%  Weight:   (P) 51.3 kg   Height:        Intake/Output Summary (Last 24 hours) at 11/17/2022 1046 Last data filed at 11/17/2022 0800 Gross per 24 hour  Intake 717.71 ml  Output 1000 ml  Net -282.29 ml   Filed Weights   11/16/22 0912 11/16/22 1256 11/17/22 0607  Weight: 52 kg 51 kg (P) 51.3 kg    Examination:  Chronically ill female laying in bed, AAOx3 HEENT: No JVD, right IJ HD catheter CVS: S1-S2, regular rhythm Lungs: Decreased breath sounds to bases Abdomen: Soft, nontender, bowel sounds present  Extremities no edema, right arm AV fistula, surgical scars, left forearm with thrombophlebitis-mild improvement   Data Reviewed:   CBC: Recent Labs  Lab  11/14/22 0331 11/14/22 1125 11/15/22 0707 11/16/22 0900 11/17/22 0329  WBC 7.2 6.4 7.0 6.0 5.9  HGB 7.1* 7.5* 7.2* 7.3* 7.8*  HCT 22.6* 23.3* 22.5* 23.4* 24.8*  MCV 98.3 100.0 96.6 101.7* 100.8*  PLT 355 340 351 368 398   Basic  Metabolic Panel: Recent Labs  Lab 11/11/22 0325 11/12/22 0558 11/13/22 0403 11/14/22 0331 11/14/22 1125 11/15/22 0707 11/16/22 0900 11/17/22 0329  NA 135 137 135 136 134* 133* 132* 135  K 4.3 3.7 4.1 4.6 5.1 3.9 3.9 4.4  CL 102 102 104 106 101 99 99 97*  CO2 18* 25 22 19* 19* 23 24 23   GLUCOSE 87 88 94 101* 104* 86 130* 89  BUN 61* 32* 48* 56* 56* 24* 40* 22  CREATININE 5.56* 2.81* 3.70* 4.08* 3.92* 2.57* 3.41* 2.30*  CALCIUM 7.2* 8.0* 8.5* 8.4* 8.6* 8.4* 8.9 9.0  PHOS 7.9* 4.5 4.5  --  4.2  --  3.6  --    GFR: Estimated Creatinine Clearance: 20.4 mL/min (A) (by C-G formula based on SCr of 2.3 mg/dL (H)). Liver Function Tests: Recent Labs  Lab 11/11/22 0325 11/12/22 0558 11/13/22 0403 11/14/22 1125 11/16/22 0900  ALBUMIN 2.1* 2.1* 2.1* 2.1* 2.2*   No results for input(s): "LIPASE", "AMYLASE" in the last 168 hours. No results for input(s): "AMMONIA" in the last 168 hours. Coagulation Profile: No results for input(s): "INR", "PROTIME" in the last 168 hours.  Cardiac Enzymes: No results for input(s): "CKTOTAL", "CKMB", "CKMBINDEX", "TROPONINI" in the last 168 hours. BNP (last 3 results) No results for input(s): "PROBNP" in the last 8760 hours. HbA1C: No results for input(s): "HGBA1C" in the last 72 hours. CBG: Recent Labs  Lab 11/13/22 0658  GLUCAP 85   Lipid Profile: No results for input(s): "CHOL", "HDL", "LDLCALC", "TRIG", "CHOLHDL", "LDLDIRECT" in the last 72 hours. Thyroid Function Tests: No results for input(s): "TSH", "T4TOTAL", "FREET4", "T3FREE", "THYROIDAB" in the last 72 hours. Anemia Panel: No results for input(s): "VITAMINB12", "FOLATE", "FERRITIN", "TIBC", "IRON", "RETICCTPCT" in the last 72 hours. Urine analysis:    Component Value Date/Time   COLORURINE YELLOW 10/25/2022 2015   APPEARANCEUR HAZY (A) 10/25/2022 2015   LABSPEC 1.009 10/25/2022 2015   PHURINE 5.0 10/25/2022 2015   GLUCOSEU 50 (A) 10/25/2022 2015   HGBUR LARGE (A) 10/25/2022  2015   BILIRUBINUR NEGATIVE 10/25/2022 2015   KETONESUR NEGATIVE 10/25/2022 2015   PROTEINUR 100 (A) 10/25/2022 2015   UROBILINOGEN 0.2 03/02/2010 2027   NITRITE POSITIVE (A) 10/25/2022 2015   LEUKOCYTESUR MODERATE (A) 10/25/2022 2015   Sepsis Labs: @LABRCNTIP (procalcitonin:4,lacticidven:4)  )No results found for this or any previous visit (from the past 240 hour(s)).   Radiology Studies: No results found.   Scheduled Meds:  (feeding supplement) PROSource Plus  30 mL Oral BID BM   atorvastatin  10 mg Oral Daily   busPIRone  10 mg Oral BID   calcium acetate  667 mg Oral TID WC   Chlorhexidine Gluconate Cloth  6 each Topical Q0600   Chlorhexidine Gluconate Cloth  6 each Topical Q0600   [START ON 11/18/2022] darbepoetin (ARANESP) injection - DIALYSIS  150 mcg Subcutaneous Q Mon-1800   famotidine  20 mg Oral Daily   feeding supplement (NEPRO CARB STEADY)  237 mL Oral TID BM   fluticasone furoate-vilanterol  1 puff Inhalation Daily   And   umeclidinium bromide  1 puff Inhalation Daily   gabapentin  100 mg Oral TID   heparin  5,000 Units Subcutaneous Q8H  leptospermum manuka honey  1 Application Topical Daily   multivitamin  1 tablet Oral QHS   nicotine  21 mg Transdermal Daily   pantoprazole  40 mg Oral Daily   senna-docusate  1 tablet Oral BID   Continuous Infusions:  sodium chloride Stopped (11/15/22 2018)   vancomycin 100 mL/hr at 11/16/22 1545     LOS: 7 days    Time spent:    Zannie Cove, MD [facility]   11/17/2022, 10:46 AM

## 2022-11-17 NOTE — Progress Notes (Addendum)
[facility] Progress Note   Subjective:   Patient seen and examined at bedside.  Report itching followed by burning at her IV when given iron again yesterday.  No other new complaints.  Trying to call a friend who may know of a room she can rent.   Objective Vitals:   11/16/22 1951 11/17/22 0059 11/17/22 0607 11/17/22 0800  BP: (!) 102/54 (!) 99/53 (!) (P) 108/51 (!) 138/57  Pulse: 71 77 (P) 82   Resp: 18 18 (P) 18 16  Temp: 98.6 F (37 C) 98.6 F (37 C) (P) 98.5 F (36.9 C) 98.2 F (36.8 C)  TempSrc: Oral Oral (P) Oral Oral  SpO2: 92% 94% (P) 96% 96%  Weight:   (P) 51.3 kg   Height:       Physical Exam General:chronically ill appearing female in NAD Heart:RRR, no mrg Lungs:CTAB, nml WOB on RA Abdomen:soft, NTND Extremities:no LE edema, +LUE edema/warmth/erythema in antecubital fossa, +RUE edema around incision Dialysis Access: TDEC, RU AVF maturing +b/t   Filed Weights   11/16/22 0912 11/16/22 1256 11/17/22 0607  Weight: 52 kg 51 kg (P) 51.3 kg    Intake/Output Summary (Last 24 hours) at 11/17/2022 1224 Last data filed at 11/17/2022 0800 Gross per 24 hour  Intake 717.71 ml  Output 1000 ml  Net -282.29 ml    Additional Objective Labs: Basic Metabolic Panel: Recent Labs  Lab 11/13/22 0403 11/14/22 0331 11/14/22 1125 11/15/22 0707 11/16/22 0900 11/17/22 0329  NA 135   < > 134* 133* 132* 135  K 4.1   < > 5.1 3.9 3.9 4.4  CL 104   < > 101 99 99 97*  CO2 22   < > 19* 23 24 23   GLUCOSE 94   < > 104* 86 130* 89  BUN 48*   < > 56* 24* 40* 22  CREATININE 3.70*   < > 3.92* 2.57* 3.41* 2.30*  CALCIUM 8.5*   < > 8.6* 8.4* 8.9 9.0  PHOS 4.5  --  4.2  --  3.6  --    < > = values in this interval not displayed.   Liver Function Tests: Recent Labs  Lab 11/13/22 0403 11/14/22 1125 11/16/22 0900  ALBUMIN 2.1* 2.1* 2.2*   CBC: Recent Labs  Lab 11/14/22 0331 11/14/22 1125 11/15/22 0707 11/16/22 0900 11/17/22 0329  WBC 7.2 6.4 7.0 6.0 5.9  HGB  7.1* 7.5* 7.2* 7.3* 7.8*  HCT 22.6* 23.3* 22.5* 23.4* 24.8*  MCV 98.3 100.0 96.6 101.7* 100.8*  PLT 355 340 351 368 398   Blood Culture    Component Value Date/Time   SDES URINE, CLEAN CATCH 10/25/2022 2015   SPECREQUEST  10/25/2022 2015    NONE Reflexed from [id number] Performed at [facility], [address] [place], [place] [address]    CULT (A) 10/25/2022 2015    >=100,000 COLONIES/mL KLEBSIELLA PNEUMONIAE Confirmed Extended Spectrum Beta-Lactamase Producer (ESBL).  In bloodstream infections from ESBL organisms, carbapenems are preferred over piperacillin/tazobactam. They are shown to have a lower risk of mortality.    REPTSTATUS 10/27/2022 FINAL 10/25/2022 2015   CBG: Recent Labs  Lab 11/13/22 0658  GLUCAP 85    Medications:  sodium chloride Stopped (11/15/22 2018)   vancomycin 100 mL/hr at 11/16/22 1545    (feeding supplement) PROSource Plus  30 mL Oral BID BM   atorvastatin  10 mg Oral Daily   busPIRone  10 mg Oral BID   calcium acetate  667 mg  Oral TID WC   Chlorhexidine Gluconate Cloth  6 each Topical Q0600   Chlorhexidine Gluconate Cloth  6 each Topical Q0600   [START ON 11/18/2022] darbepoetin (ARANESP) injection - DIALYSIS  150 mcg Subcutaneous Q Mon-1800   famotidine  20 mg Oral Daily   feeding supplement (NEPRO CARB STEADY)  237 mL Oral TID BM   fluticasone furoate-vilanterol  1 puff Inhalation Daily   And   umeclidinium bromide  1 puff Inhalation Daily   gabapentin  100 mg Oral TID   heparin  5,000 Units Subcutaneous Q8H   leptospermum manuka honey  1 Application Topical Daily   multivitamin  1 tablet Oral QHS   nicotine  21 mg Transdermal Daily   pantoprazole  40 mg Oral Daily   senna-docusate  1 tablet Oral BID    Dialysis Orders: [facility]  3h  52kg  400/1.5   2/2 bath  [facility] TDC  Heparin none - last OP HD 6/27 - last inpt HD was 11/14/22 -> she never started at [facility] clinic - rocaltrol 0.5 mcg po three times per week - venofer 100mg  q hd  8/22- 9/07 - mircera 60 mcg IV q 2 wks, never started   Assessment/Plan: 1. Pulm edema: In setting of not going to HD for > 1 week. Resolved with HD.  2. L arm thrombophlebitis - on Vancomycin. Appears stable.  2. ESRD: TTS schedule - next HD on 11/19/22. Her AVF was transposed 8/28 - appreciated VVS. Still using TDC until this heals (~1 mo). 3. HTN/volume: BP soft stable.  LE edema improved.  Continue UF as tolerated.  4. Anemia of ESRD: Hgb very low on admit. Given 1U PRBCs 8/25. Now 7.8. Continue Aranesp q Monday while here. Not tolerating iron infusions - will d/c. 5. Secondary hyperparathyroidism: Calcium and phos in goal.  Continue binders and VDRA. 6. Nutrition: Alb low, continue supplements. Renal diet w/fluid restrictions.  7. Homelessness: No permanent address or phone - arranging HD transportation is impossible for her at this moment, SW/CM involved. She is not interested in shelter. She is reaching out to a friend for possible room options.  Per PMD.  Virgina Norfolk, PA-C [facility] 11/17/2022,12:24 PM  LOS: 7 days     Seen and examined independently.  Agree with note and exam as documented above by physician extender and as noted here.  States she has had a cough ever since she had PNA - this is nothing new.  She feels ok.  Working on getting a room to rent  General adult female in bed in no acute distress; chronically ill-appearing  HEENT normocephalic atraumatic extraocular movements intact sclera anicteric Neck supple trachea midline Lungs clear to auscultation bilaterally normal work of breathing at rest on room air Heart S1S2 no rub Abdomen soft nontender nondistended Extremities no edema; legs tender Psych no anxiety or agitation Access RIJ tunn catheter; RUE AVF bruit and thrill    ESRD - HD per TTS schedule   Homelessness - appreciate SW.  I have recommended a homeless shelter and she states that she would rather just sleep behind a  building than have to get up at 6am.  Discussed safety issues with that plan.  Ultimately up to the patient.  She states that she gets her check on 9/3 and is getting a room to rent at that time.      Right AVF in situ - s/p right 2nd stage BVT.  Appreciate vascular.  Anemia CKD - on ESA - increase aranesp to 150 mcg every week for now. PRBC's per primary team    Disposition per primary team.  Appears shelter is most appropriate and patient does not wish to go to a shelter and states she will have money for a room as of 9/3.  Now s/p right 2nd stage BVT with VVS.  Appreciate social work and primary team assistance.    Estanislado Emms, MD 11/17/2022 3:59 PM

## 2022-11-17 NOTE — Plan of Care (Signed)
  Problem: Clinical Measurements: Goal: Ability to maintain clinical measurements within normal limits will improve Outcome: Progressing   

## 2022-11-18 ENCOUNTER — Inpatient Hospital Stay (HOSPITAL_COMMUNITY): Payer: Medicaid Other

## 2022-11-18 DIAGNOSIS — N186 End stage renal disease: Secondary | ICD-10-CM | POA: Diagnosis not present

## 2022-11-18 DIAGNOSIS — Z992 Dependence on renal dialysis: Secondary | ICD-10-CM | POA: Diagnosis not present

## 2022-11-18 MED ORDER — CHLORHEXIDINE GLUCONATE CLOTH 2 % EX PADS
6.0000 | MEDICATED_PAD | Freq: Every day | CUTANEOUS | Status: DC
  Administered 2022-11-19 – 2022-11-21 (×3): 6 via TOPICAL

## 2022-11-18 NOTE — Progress Notes (Signed)
Patient refused the dressing change of RLE.

## 2022-11-18 NOTE — Progress Notes (Signed)
Patient refusing vitals at this time. States she is about to eat.

## 2022-11-18 NOTE — Plan of Care (Signed)
  Problem: Nutrition: Goal: Adequate nutrition will be maintained Outcome: Progressing   Problem: Coping: Goal: Level of anxiety will decrease Outcome: Progressing   

## 2022-11-18 NOTE — Progress Notes (Signed)
Upon chart review, no vitals signs documented from this morning or lunch. Primary nurse contacted nurse aide. She stated that patient refused morning and noon vital signs.

## 2022-11-18 NOTE — Progress Notes (Signed)
[facility] Progress Note   Subjective:   pt seen up in chair.   Objective Vitals:   11/17/22 1938 11/18/22 0059 11/18/22 0436 11/18/22 0506  BP: (!) 155/79 (!) 133/53 (!) 87/58 105/67  Pulse: 88 79 71   Resp: 16 16 18    Temp: 97.9 F (36.6 C) 98.7 F (37.1 C) 98.7 F (37.1 C)   TempSrc: Oral Oral Oral   SpO2: 96% 95% 93%   Weight:   51.4 kg   Height:       Physical Exam General:chronically ill appearing female in NAD Heart:RRR, no mrg Lungs:CTAB, nml WOB on RA Abdomen:soft, NTND Extremities:no LE edema, +LUE edema/warmth/erythema in antecubital fossa, +RUE edema around incision Dialysis Access: TDEC, RU AVF maturing +b/t     Dialysis Orders: [facility] (never started there)  3h  52kg  400/1.5   2/2 bath  [facility] TDC  Heparin none - last inpt HD was 11/14/22 -> she never started at [facility] clinic - rocaltrol 0.5 mcg po three times per week - venofer 100mg  q hd 8/22- 9/07 - mircera 60 mcg IV q 2 wks, never started   Assessment/Plan: Pulm edema: In setting of not going to HD for > 1 week. Resolved with HD.  L arm thrombophlebitis - on Vancomycin. Appears stable.  ESRD: TTS schedule. Next HD tomorrow.  HD access: VVS did 2nd stage BVT here 8/28 - appreciate VVS. Still using TDC until this heals (~1 mo).  HTN/volume: BP soft stable.  LE edema improved.  Continue UF as tolerated.  Anemia of ESRD: Hgb very low on admit. Given 1U PRBCs 8/25. Now 7.8. Continue Aranesp q Monday while here. Not tolerating iron infusions - will d/c. Secondary hyperparathyroidism: Calcium and phos in goal.  Continue binders and VDRA. Nutrition: Alb low, continue supplements. Renal diet w/fluid restrictions.  Homelessness: No permanent address or phone - arranging HD transportation is impossible for her at this moment, SW/CM involved. She is not interested in shelter. She is reaching out to a friend for possible room options.  She requested transfer back to [place] - she was  reassigned to [facility] during last admission. Will consult w/ [place] Ana SW tomorrow.   Cynthia Moselle  MD  [facility] 11/18/2022, 5:15 PM  Recent Labs  Lab 11/14/22 1125 11/15/22 0707 11/16/22 0900 11/17/22 0329  HGB 7.5*   < > 7.3* 7.8*  ALBUMIN 2.1*  --  2.2*  --   CALCIUM 8.6*   < > 8.9 9.0  PHOS 4.2  --  3.6  --   CREATININE 3.92*   < > 3.41* 2.30*  K 5.1   < > 3.9 4.4   < > = values in this interval not displayed.    Inpatient medications:  (feeding supplement) PROSource Plus  30 mL Oral BID BM   atorvastatin  10 mg Oral Daily   busPIRone  10 mg Oral BID   calcium acetate  667 mg Oral TID WC   Chlorhexidine Gluconate Cloth  6 each Topical Q0600   Chlorhexidine Gluconate Cloth  6 each Topical Q0600   darbepoetin (ARANESP) injection - DIALYSIS  150 mcg Subcutaneous Q Mon-1800   famotidine  20 mg Oral Daily   feeding supplement (NEPRO CARB STEADY)  237 mL Oral TID BM   fluticasone furoate-vilanterol  1 puff Inhalation Daily   And   umeclidinium bromide  1 puff Inhalation Daily   gabapentin  100 mg Oral TID   heparin  5,000 Units  Subcutaneous Q8H   leptospermum manuka honey  1 Application Topical Daily   multivitamin  1 tablet Oral QHS   nicotine  21 mg Transdermal Daily   pantoprazole  40 mg Oral Daily   senna-docusate  1 tablet Oral BID    sodium chloride Stopped (11/15/22 2018)   vancomycin 100 mL/hr at 11/16/22 1545   sodium chloride, acetaminophen **OR** acetaminophen, albuterol, HYDROmorphone (DILAUDID) injection, hydrOXYzine, lidocaine, melatonin, ondansetron (ZOFRAN) IV, oxyCODONE-acetaminophen, traZODone

## 2022-11-18 NOTE — Plan of Care (Signed)

## 2022-11-18 NOTE — Progress Notes (Signed)
OT Cancellation Note  Patient Details Name: [name] MRN: [id number] DOB: Apr 06, 1960   Cancelled Treatment:    Reason Eval/Treat Not Completed: Pain limiting ability to participate.  Pt reports she needs to rest, is in too much pain, got up earlier to the chair and is now back to bed.  Attempted to persuade patient but she declined and becomes agitated.  Will attempt one more time later this week and if she refuses, will sign off for OT services.    Perrin Maltese, OTR/L Acute Rehabilitation Services  Office [phone]  11/18/2022, 1:56 PM

## 2022-11-18 NOTE — Progress Notes (Signed)
Mobility Specialist Progress Note:    11/18/22 1133  Mobility  Activity Ambulated with assistance in room;Transferred from bed to chair  Level of Assistance Contact guard assist, steadying assist  Assistive Device Other (Comment) (HHA)  Distance Ambulated (ft) 10 ft  Activity Response Tolerated well  Mobility Referral Yes  $Mobility charge 1 Mobility  Mobility Specialist Start Time (ACUTE ONLY) 0959  Mobility Specialist Stop Time (ACUTE ONLY) 1008  Mobility Specialist Time Calculation (min) (ACUTE ONLY) 9 min   Pt received in bed, declined ambulating in halls but agreeable to transfer to chair. Pt needed no physical assistance during session just a HHA for steadying. No c/o throughout. Situated in chair w/ call bell and personal belongings in reach. All needs met.   Thompson Grayer Mobility Specialist  Please contact vis Secure Chat or  Rehab Office [phone]

## 2022-11-18 NOTE — Progress Notes (Signed)
PT Cancellation Note  Patient Details Name: [name] MRN: [id number] DOB: [birth year]   Cancelled Treatment:    Reason Eval/Treat Not Completed: Patient declined, no reason specified - pt states "I wish you would leave me alone and let me sleep"  Marye Round, PT DPT Acute Rehabilitation Services Secure Chat Preferred  Office [phone]    Truddie Coco 11/18/2022, 3:31 PM

## 2022-11-18 NOTE — Progress Notes (Signed)
PROGRESS NOTE    [name]  RUE:[id number] DOB: 01-16-1961 DOA: 11/09/2022 PCP: [name], MD  C2/F with ESRD on hemodialysis, chronic diastolic CHF, COPD, dyslipidemia presented to the ED with worsening lower extremity edema. -Recent admission 8/9-8/17 for sepsis from pneumonia complicated by renal failure, eventually required hemodialysis, since discharge was noncompliant with HD. -Volume overloaded on admission, with creatinine of 5.2, BUN 59, BNP 3 3, troponin 23, hemoglobin 7.1, chest x-ray noted bilateral interstitial infiltrates, right pleural effusion -8/23 hemoglobin down to 6.3, transfuse 1 unit PRBC 8/26 HD with ultrafiltration, 3 L -8/28 underwent second stage basilic vein fistula transposition Dr. Lenell Antu -8/29 developed thrombophlebitis in left arm -8/30, antibiotics changed to vancomycin   Subjective: Complains of some discomfort in the middle of her chest and cough, left arm swelling and pain and starting to improve  Assessment and Plan:  End-stage renal disease on hemodialysis (HCC) Volume overload, noncompliance, hyponatremia -Did not do outpatient HD X 10 days prior to admission -Volume status improved -Vascular surgery consulted for AV fistula, second step completed 8/28 -Disposition complicated by homelessness-no phone or permanent address, unable to have HD transportation in this setting, social work following, she plans to go to a motel next week after she gets her check tomorrow  Severe left arm thrombophlebitis, cellulitis -At site of previous IV, IV site changed -Continue cold compresses, continue vancomycin day 4 -Encouraged elevation -mild improvement today, change to oral doxycycline at discharge  Anemia of chronic renal disease,  -sp one unit PRBC transfusion  Iron deficiency anemia, with serum iron 7, TIBC 200, transferrin saturation of 4 and ferritin 187.  SP IV iron 8/26, 8/31 , added EPO. -Hemoglobin 7.8 yesterday  Acute on chronic  diastolic CHF (congestive heart failure) (HCC) Echocardiogram with preserved LV systolic function with EF 55 to 60%, RV systolic function preserved, mild aortic valve stenosis, RVSP 43.1 mmHg.  -Volume management with HD  Essential hypertension -Improving with HD, continue amlodipine  History of COPD Stable Continue with bronchodilator therapy.   Hyperlipidemia Continue with statin therapy.   GAD (generalized anxiety disorder) Continue with hydroxyzine and buspirone.   Homelessness  Pressure injury of skin Present on admission.  Stage 2 pressure ulcer left buttock and coccyx.  Continue local skin care.   DVT prophylaxis: heparin SQ Code Status: Full COde Family Communication: None present Disposition Plan: Homeless  Consultants:    Procedures:   Antimicrobials:    Objective: Vitals:   11/17/22 1938 11/18/22 0059 11/18/22 0436 11/18/22 0506  BP: (!) 155/79 (!) 133/53 (!) 87/58 105/67  Pulse: 88 79 71   Resp: 16 16 18    Temp: 97.9 F (36.6 C) 98.7 F (37.1 C) 98.7 F (37.1 C)   TempSrc: Oral Oral Oral   SpO2: 96% 95% 93%   Weight:   51.4 kg   Height:        Intake/Output Summary (Last 24 hours) at 11/18/2022 1121 Last data filed at 11/17/2022 2157 Gross per 24 hour  Intake 720 ml  Output --  Net 720 ml   Filed Weights   11/16/22 1256 11/17/22 0607 11/18/22 0436  Weight: 51 kg (P) 51.3 kg 51.4 kg    Examination:  Chronically ill female laying in bed, AAOx3 HEENT: No JVD, right IJ HD catheter CVS: S1-S2, regular rhythm Lungs: Decreased breath sounds at the bases Abdomen: Soft, nontender, bowel sounds present Extremities no edema, right arm AV fistula, surgical scars, left forearm with thrombophlebitis-mild improvement   Data Reviewed:  CBC: Recent Labs  Lab 11/14/22 0331 11/14/22 1125 11/15/22 0707 11/16/22 0900 11/17/22 0329  WBC 7.2 6.4 7.0 6.0 5.9  HGB 7.1* 7.5* 7.2* 7.3* 7.8*  HCT 22.6* 23.3* 22.5* 23.4* 24.8*  MCV 98.3 100.0 96.6  101.7* 100.8*  PLT 355 340 351 368 398   Basic Metabolic Panel: Recent Labs  Lab 11/12/22 0558 11/13/22 0403 11/14/22 0331 11/14/22 1125 11/15/22 0707 11/16/22 0900 11/17/22 0329  NA 137 135 136 134* 133* 132* 135  K 3.7 4.1 4.6 5.1 3.9 3.9 4.4  CL 102 104 106 101 99 99 97*  CO2 25 22 19* 19* 23 24 23   GLUCOSE 88 94 101* 104* 86 130* 89  BUN 32* 48* 56* 56* 24* 40* 22  CREATININE 2.81* 3.70* 4.08* 3.92* 2.57* 3.41* 2.30*  CALCIUM 8.0* 8.5* 8.4* 8.6* 8.4* 8.9 9.0  PHOS 4.5 4.5  --  4.2  --  3.6  --    GFR: Estimated Creatinine Clearance: 20.6 mL/min (A) (by C-G formula based on SCr of 2.3 mg/dL (H)). Liver Function Tests: Recent Labs  Lab 11/12/22 0558 11/13/22 0403 11/14/22 1125 11/16/22 0900  ALBUMIN 2.1* 2.1* 2.1* 2.2*   No results for input(s): "LIPASE", "AMYLASE" in the last 168 hours. No results for input(s): "AMMONIA" in the last 168 hours. Coagulation Profile: No results for input(s): "INR", "PROTIME" in the last 168 hours.  Cardiac Enzymes: No results for input(s): "CKTOTAL", "CKMB", "CKMBINDEX", "TROPONINI" in the last 168 hours. BNP (last 3 results) No results for input(s): "PROBNP" in the last 8760 hours. HbA1C: No results for input(s): "HGBA1C" in the last 72 hours. CBG: Recent Labs  Lab 11/13/22 0658  GLUCAP 85   Lipid Profile: No results for input(s): "CHOL", "HDL", "LDLCALC", "TRIG", "CHOLHDL", "LDLDIRECT" in the last 72 hours. Thyroid Function Tests: No results for input(s): "TSH", "T4TOTAL", "FREET4", "T3FREE", "THYROIDAB" in the last 72 hours. Anemia Panel: No results for input(s): "VITAMINB12", "FOLATE", "FERRITIN", "TIBC", "IRON", "RETICCTPCT" in the last 72 hours. Urine analysis:    Component Value Date/Time   COLORURINE YELLOW 10/25/2022 2015   APPEARANCEUR HAZY (A) 10/25/2022 2015   LABSPEC 1.009 10/25/2022 2015   PHURINE 5.0 10/25/2022 2015   GLUCOSEU 50 (A) 10/25/2022 2015   HGBUR LARGE (A) 10/25/2022 2015   BILIRUBINUR  NEGATIVE 10/25/2022 2015   KETONESUR NEGATIVE 10/25/2022 2015   PROTEINUR 100 (A) 10/25/2022 2015   UROBILINOGEN 0.2 03/02/2010 2027   NITRITE POSITIVE (A) 10/25/2022 2015   LEUKOCYTESUR MODERATE (A) 10/25/2022 2015   Sepsis Labs: @LABRCNTIP (procalcitonin:4,lacticidven:4)  )No results found for this or any previous visit (from the past 240 hour(s)).   Radiology Studies: No results found.   Scheduled Meds:  (feeding supplement) PROSource Plus  30 mL Oral BID BM   atorvastatin  10 mg Oral Daily   busPIRone  10 mg Oral BID   calcium acetate  667 mg Oral TID WC   Chlorhexidine Gluconate Cloth  6 each Topical Q0600   Chlorhexidine Gluconate Cloth  6 each Topical Q0600   darbepoetin (ARANESP) injection - DIALYSIS  150 mcg Subcutaneous Q Mon-1800   famotidine  20 mg Oral Daily   feeding supplement (NEPRO CARB STEADY)  237 mL Oral TID BM   fluticasone furoate-vilanterol  1 puff Inhalation Daily   And   umeclidinium bromide  1 puff Inhalation Daily   gabapentin  100 mg Oral TID   heparin  5,000 Units Subcutaneous Q8H   leptospermum manuka honey  1 Application Topical Daily   multivitamin  1 tablet Oral QHS   nicotine  21 mg Transdermal Daily   pantoprazole  40 mg Oral Daily   senna-docusate  1 tablet Oral BID   Continuous Infusions:  sodium chloride Stopped (11/15/22 2018)   vancomycin 100 mL/hr at 11/16/22 1545     LOS: 8 days    Time spent:    Zannie Cove, MD [facility]   11/18/2022, 11:21 AM

## 2022-11-18 NOTE — Progress Notes (Signed)
Pharmacy Antibiotic Note  [name] is a 62 y.o. female admitted on 11/09/2022 with cellulitis.  Pharmacy has been consulted for vancomycin dosing. Patient is ESRD, last HD Saturday 8/31, received post HD dose 500mg  x1.   Currently giving Vanc 500 mg q TTS after HD.  Plan: F/U pre-HD random vanc level 9/3  Height: 5\' 7"  (170.2 cm) Weight: 51.4 kg (113 lb 4.8 oz) IBW/kg (Calculated) : 61.6  Temp (24hrs), Avg:98.4 F (36.9 C), Min:97.9 F (36.6 C), Max:98.7 F (37.1 C)  Recent Labs  Lab 11/14/22 0331 11/14/22 1125 11/15/22 0707 11/16/22 0900 11/17/22 0329  WBC 7.2 6.4 7.0 6.0 5.9  CREATININE 4.08* 3.92* 2.57* 3.41* 2.30*    Estimated Creatinine Clearance: 20.6 mL/min (A) (by C-G formula based on SCr of 2.3 mg/dL (H)).    Allergies  Allergen Reactions   Zestril [Lisinopril] Other (See Comments)    Hyperkalemia    Antimicrobials this admission: Cefazolin 8/28 > 8/30 Vancomycin 8/30 >   Dose adjustments this admission:  Microbiology results: none  Verdene Rio, PharmD PGY1 Pharmacy Resident

## 2022-11-19 DIAGNOSIS — N186 End stage renal disease: Secondary | ICD-10-CM | POA: Diagnosis not present

## 2022-11-19 DIAGNOSIS — Z992 Dependence on renal dialysis: Secondary | ICD-10-CM | POA: Diagnosis not present

## 2022-11-19 LAB — RENAL FUNCTION PANEL
Albumin: 2.4 g/dL — ABNORMAL LOW (ref 3.5–5.0)
Anion gap: 14 (ref 5–15)
BUN: 15 mg/dL (ref 8–23)
CO2: 26 mmol/L (ref 22–32)
Calcium: 8.6 mg/dL — ABNORMAL LOW (ref 8.9–10.3)
Chloride: 94 mmol/L — ABNORMAL LOW (ref 98–111)
Creatinine, Ser: 1.47 mg/dL — ABNORMAL HIGH (ref 0.44–1.00)
GFR, Estimated: 40 mL/min — ABNORMAL LOW (ref >60)
Glucose, Bld: 92 mg/dL (ref 70–99)
Phosphorus: 2.2 mg/dL — ABNORMAL LOW (ref 2.5–4.6)
Potassium: 4.4 mmol/L (ref 3.5–5.1)
Sodium: 134 mmol/L — ABNORMAL LOW (ref 135–145)

## 2022-11-19 LAB — CBC
HCT: 23.7 % — ABNORMAL LOW (ref 36.0–46.0)
Hemoglobin: 7.3 g/dL — ABNORMAL LOW (ref 12.0–15.0)
MCH: 31.5 pg (ref 26.0–34.0)
MCHC: 30.8 g/dL (ref 30.0–36.0)
MCV: 102.2 fL — ABNORMAL HIGH (ref 80.0–100.0)
Platelets: 350 10*3/uL (ref 150–400)
RBC: 2.32 MIL/uL — ABNORMAL LOW (ref 3.87–5.11)
RDW: 21.1 % — ABNORMAL HIGH (ref 11.5–15.5)
WBC: 6.1 10*3/uL (ref 4.0–10.5)
nRBC: 0 % (ref 0.0–0.2)

## 2022-11-19 LAB — VANCOMYCIN, RANDOM: Vancomycin Rm: 12 ug/mL

## 2022-11-19 MED ORDER — ANTICOAGULANT SODIUM CITRATE 4% (200MG/5ML) IV SOLN: 5.0000 mL | Status: DC | PRN

## 2022-11-19 MED ORDER — HEPARIN SODIUM (PORCINE) 1000 UNIT/ML IJ SOLN
INTRAMUSCULAR | Status: AC
  Administered 2022-11-19: 3800 [IU]
  Filled 2022-11-19: qty 4

## 2022-11-19 MED ORDER — NEPRO/CARBSTEADY PO LIQD: 237.0000 mL | ORAL | Status: DC | PRN

## 2022-11-19 MED ORDER — VANCOMYCIN HCL 500 MG/100ML IV SOLN
500.0000 mg | INTRAVENOUS | Status: DC
  Administered 2022-11-22: 500 mg via INTRAVENOUS
  Filled 2022-11-19: qty 100

## 2022-11-19 MED ORDER — VANCOMYCIN HCL 750 MG/150ML IV SOLN
750.0000 mg | Freq: Once | INTRAVENOUS | Status: AC
  Administered 2022-11-19: 750 mg via INTRAVENOUS
  Filled 2022-11-19: qty 150

## 2022-11-19 MED ORDER — ALTEPLASE 2 MG IJ SOLR: 2.0000 mg | Freq: Once | INTRAMUSCULAR | Status: DC | PRN

## 2022-11-19 MED ORDER — HEPARIN SODIUM (PORCINE) 1000 UNIT/ML DIALYSIS: 1000.0000 [IU] | INTRAMUSCULAR | Status: DC | PRN

## 2022-11-19 NOTE — Plan of Care (Signed)
  Problem: Education: Goal: Knowledge of General Education information will improve Description: Including pain rating scale, medication(s)/side effects and non-pharmacologic comfort measures Outcome: Progressing   Problem: Health Behavior/Discharge Planning: Goal: Ability to manage health-related needs will improve Outcome: Progressing   Problem: Clinical Measurements: Goal: Ability to maintain clinical measurements within normal limits will improve Outcome: Progressing Goal: Will remain free from infection Outcome: Progressing Goal: Diagnostic test results will improve Outcome: Progressing   Problem: Activity: Goal: Risk for activity intolerance will decrease Outcome: Progressing   Problem: Nutrition: Goal: Adequate nutrition will be maintained Outcome: Progressing   Problem: Coping: Goal: Level of anxiety will decrease Outcome: Progressing   Problem: Elimination: Goal: Will not experience complications related to bowel motility Outcome: Progressing Goal: Will not experience complications related to urinary retention Outcome: Progressing   Problem: Pain Managment: Goal: General experience of comfort will improve Outcome: Progressing   Problem: Safety: Goal: Ability to remain free from injury will improve Outcome: Progressing   Problem: Skin Integrity: Goal: Risk for impaired skin integrity will decrease Outcome: Progressing   Problem: Education: Goal: Ability to demonstrate management of disease process will improve Outcome: Progressing   Problem: Activity: Goal: Capacity to carry out activities will improve Outcome: Progressing   Problem: Cardiac: Goal: Ability to achieve and maintain adequate cardiopulmonary perfusion will improve Outcome: Progressing

## 2022-11-19 NOTE — Progress Notes (Signed)
PROGRESS NOTE    [name]  ZOX:[id number] DOB: 03-17-1961 DOA: 11/09/2022 PCP: [name], MD  C2/F with ESRD on hemodialysis, chronic diastolic CHF, COPD, dyslipidemia presented to the ED with worsening lower extremity edema. -Recent admission 8/9-8/17 for sepsis from pneumonia complicated by renal failure, eventually required hemodialysis, since discharge was noncompliant with HD. -Volume overloaded on admission, with creatinine of 5.2, BUN 59, BNP 3 3, troponin 23, hemoglobin 7.1, chest x-ray noted bilateral interstitial infiltrates, right pleural effusion -8/23 hemoglobin down to 6.3, transfuse 1 unit PRBC 8/26 HD with ultrafiltration, 3 L -8/28 underwent second stage basilic vein fistula transposition Dr. Lenell Antu -8/29 developed severe thrombophlebitis in left arm -8/30, antibiotics changed to vancomycin   Subjective: Complains of left arm pain, marginally better  Assessment and Plan:  End-stage renal disease on hemodialysis (HCC) Volume overload, noncompliance, hyponatremia -Did not do outpatient HD X 10 days prior to admission -Volume status improved -Vascular surgery consulted for AV fistula, second step completed 8/28 -Disposition complicated by homelessness-no phone or permanent address, unable to have HD transportation in this setting, social work following, TOC following  Severe left arm thrombophlebitis, cellulitis -At site of previous IV, IV site changed -Continue cold compresses, continue vancomycin day 5 -Encouraged elevation -Mild improvement noted, change to oral doxycycline at discharge  Anemia of chronic renal disease,  -sp one unit PRBC transfusion  Iron deficiency anemia, with serum iron 7, TIBC 200, transferrin saturation of 4 and ferritin 187.  SP IV iron 8/26, 8/31 , added EPO. -Hemoglobin 7.8 yesterday  Acute on chronic diastolic CHF (congestive heart failure) (HCC) Echocardiogram with preserved LV systolic function with EF 55 to 60%, RV  systolic function preserved, mild aortic valve stenosis, RVSP 43.1 mmHg.  -Volume management with HD  Essential hypertension -Improving with HD, continue amlodipine  History of COPD Stable Continue with bronchodilator therapy.   Hyperlipidemia Continue with statin therapy.   GAD (generalized anxiety disorder) Continue with hydroxyzine and buspirone.   Homelessness  Pressure injury of skin Present on admission.  Stage 2 pressure ulcer left buttock and coccyx.  Continue local skin care.   DVT prophylaxis: heparin SQ Code Status: Full COde Family Communication: None present Disposition Plan: Homeless  Consultants:    Procedures:   Antimicrobials:    Objective: Vitals:   11/19/22 1000 11/19/22 1030 11/19/22 1216 11/19/22 1224  BP: 113/71 120/71 137/77 (!) 153/78  Pulse: 87 83 85 91  Resp: 17 17 (!) 23 (!) 22  Temp:      TempSrc:      SpO2: 97% 98% 100% 100%  Weight:      Height:        Intake/Output Summary (Last 24 hours) at 11/19/2022 1328 Last data filed at 11/19/2022 1224 Gross per 24 hour  Intake 1280 ml  Output 0 ml  Net 1280 ml   Filed Weights   11/17/22 0607 11/18/22 0436 11/19/22 0414  Weight: (P) 51.3 kg 51.4 kg 52.4 kg    Examination:  Chronically ill female laying in bed, AAOx3 HEENT: No JVD, right IJ HD catheter CVS: S1-S2, regular rhythm Lungs: Decreased breath sounds at the bases Abdomen: Soft, nontender, bowel sounds present  Extremities no edema, right arm AV fistula, surgical scars, left forearm with thrombophlebitis-mild improvement   Data Reviewed:   CBC: Recent Labs  Lab 11/14/22 1125 11/15/22 0707 11/16/22 0900 11/17/22 0329 11/19/22 0411  WBC 6.4 7.0 6.0 5.9 6.1  HGB 7.5* 7.2* 7.3* 7.8* 7.3*  HCT 23.3* 22.5* 23.4*  24.8* 23.7*  MCV 100.0 96.6 101.7* 100.8* 102.2*  PLT 340 351 368 398 350   Basic Metabolic Panel: Recent Labs  Lab 11/13/22 0403 11/14/22 0331 11/14/22 1125 11/15/22 0707 11/16/22 0900  11/17/22 0329  NA 135 136 134* 133* 132* 135  K 4.1 4.6 5.1 3.9 3.9 4.4  CL 104 106 101 99 99 97*  CO2 22 19* 19* 23 24 23   GLUCOSE 94 101* 104* 86 130* 89  BUN 48* 56* 56* 24* 40* 22  CREATININE 3.70* 4.08* 3.92* 2.57* 3.41* 2.30*  CALCIUM 8.5* 8.4* 8.6* 8.4* 8.9 9.0  PHOS 4.5  --  4.2  --  3.6  --    GFR: Estimated Creatinine Clearance: 21 mL/min (A) (by C-G formula based on SCr of 2.3 mg/dL (H)). Liver Function Tests: Recent Labs  Lab 11/13/22 0403 11/14/22 1125 11/16/22 0900  ALBUMIN 2.1* 2.1* 2.2*   No results for input(s): "LIPASE", "AMYLASE" in the last 168 hours. No results for input(s): "AMMONIA" in the last 168 hours. Coagulation Profile: No results for input(s): "INR", "PROTIME" in the last 168 hours.  Cardiac Enzymes: No results for input(s): "CKTOTAL", "CKMB", "CKMBINDEX", "TROPONINI" in the last 168 hours. BNP (last 3 results) No results for input(s): "PROBNP" in the last 8760 hours. HbA1C: No results for input(s): "HGBA1C" in the last 72 hours. CBG: Recent Labs  Lab 11/13/22 0658  GLUCAP 85   Lipid Profile: No results for input(s): "CHOL", "HDL", "LDLCALC", "TRIG", "CHOLHDL", "LDLDIRECT" in the last 72 hours. Thyroid Function Tests: No results for input(s): "TSH", "T4TOTAL", "FREET4", "T3FREE", "THYROIDAB" in the last 72 hours. Anemia Panel: No results for input(s): "VITAMINB12", "FOLATE", "FERRITIN", "TIBC", "IRON", "RETICCTPCT" in the last 72 hours. Urine analysis:    Component Value Date/Time   COLORURINE YELLOW 10/25/2022 2015   APPEARANCEUR HAZY (A) 10/25/2022 2015   LABSPEC 1.009 10/25/2022 2015   PHURINE 5.0 10/25/2022 2015   GLUCOSEU 50 (A) 10/25/2022 2015   HGBUR LARGE (A) 10/25/2022 2015   BILIRUBINUR NEGATIVE 10/25/2022 2015   KETONESUR NEGATIVE 10/25/2022 2015   PROTEINUR 100 (A) 10/25/2022 2015   UROBILINOGEN 0.2 03/02/2010 2027   NITRITE POSITIVE (A) 10/25/2022 2015   LEUKOCYTESUR MODERATE (A) 10/25/2022 2015   Sepsis  Labs: @LABRCNTIP (procalcitonin:4,lacticidven:4)  )No results found for this or any previous visit (from the past 240 hour(s)).   Radiology Studies: DG CHEST PORT 1 VIEW  Result Date: 11/18/2022 CLINICAL DATA:  Cough. EXAM: PORTABLE CHEST 1 VIEW COMPARISON:  Radiograph 11/09/2022.  Chest CT 11/15/2021 FINDINGS: Right-sided dialysis catheter remains in place. Progressive volume loss at the right lung base with increasing opacity, may be secondary to increasing pleural effusion or mucous plugging/collapse. Background peribronchial thickening. Trace left pleural effusion. No pneumothorax. IMPRESSION: 1. Progressive volume loss at the right lung base with increasing opacity, may be secondary to increasing pleural effusion or mucous plugging/collapse. 2. Trace left pleural effusion. Electronically Signed   By: Narda Rutherford M.D.   On: 11/18/2022 15:51     Scheduled Meds:  (feeding supplement) PROSource Plus  30 mL Oral BID BM   atorvastatin  10 mg Oral Daily   busPIRone  10 mg Oral BID   calcium acetate  667 mg Oral TID WC   Chlorhexidine Gluconate Cloth  6 each Topical Q0600   darbepoetin (ARANESP) injection - DIALYSIS  150 mcg Subcutaneous Q Mon-1800   famotidine  20 mg Oral Daily   feeding supplement (NEPRO CARB STEADY)  237 mL Oral TID BM   fluticasone furoate-vilanterol  1 puff Inhalation Daily   And   umeclidinium bromide  1 puff Inhalation Daily   gabapentin  100 mg Oral TID   heparin  5,000 Units Subcutaneous Q8H   leptospermum manuka honey  1 Application Topical Daily   multivitamin  1 tablet Oral QHS   nicotine  21 mg Transdermal Daily   pantoprazole  40 mg Oral Daily   senna-docusate  1 tablet Oral BID   Continuous Infusions:  sodium chloride Stopped (11/15/22 2018)   [START ON 11/21/2022] vancomycin       LOS: 9 days    Time spent:    Zannie Cove, MD [facility]   11/19/2022, 1:28 PM

## 2022-11-19 NOTE — Procedures (Signed)
HD Note:  Some information was entered later than the data was gathered due to patient care needs. The stated time with the data is accurate.  Received patient in bed to unit.   Alert and oriented.   Informed consent signed and in chart.   Access used: Upper right chest HD catheter Access issues: None  Patient arrived at unit with SBP of 90.  Dr Arlean Hopping ordered to give 200 ml of NS x 2 to support BP.  Lower limit of SBP set at 90 for the duration of the treatement. Patient received 200 ml at initiation of treatment.  No further bolus was needed  Patient received 200 ml during treatment with no volume removed.  Additional 100 ml was received with antibiotic administration  TX duration: 3.5 hours  Alert, without acute distress.   Hand-off given to patient's nurse.   Transported back to the room   Lewie Deman L. Dareen Piano, RN [facility].

## 2022-11-19 NOTE — Progress Notes (Signed)
New Admission Note:    Arrival Method: Bed Mental Orientation: a/ox4 Telemetry: box 14 Assessment: completed Skin: intact IV: 22 left arm Pain: n/a Tubes: n/a Safety Measures: nonskid socks Admission: completed Orientation: Patient has been oriented to the room, unit and staff.  Family: n/a Belongings: at bedside  Orders have been reviewed and implemented. Will continue to monitor the patient. Call light has been placed within reach and bed alarm has been activated.   Fabian Sharp BSN, RN-BC Phone number: [phone]

## 2022-11-19 NOTE — Plan of Care (Signed)

## 2022-11-19 NOTE — Progress Notes (Signed)
[facility] Progress Note   Subjective:   pt seen up in chair.   Objective Vitals:   11/19/22 0900 11/19/22 0930 11/19/22 1000 11/19/22 1030  BP: (!) 112/59 104/61 113/71 120/71  Pulse: 86 89 87 83  Resp: 16 16 17 17   Temp:      TempSrc:      SpO2: 97% 96% 97% 98%  Weight:      Height:       Physical Exam General:chronically ill appearing female in NAD Heart:RRR, no mrg Lungs:CTAB, nml WOB on RA Abdomen:soft, NTND Extremities:no LE edema, +LUE edema/warmth/erythema in antecubital fossa, +RUE edema around incision Dialysis Access: TDEC, RU AVF maturing +b/t     [facility] ([facility] started [facility])  3h  52kg  400/1.5   2/2 bath  [facility] TDC  Heparin none - last inpt HD was 11/14/22 -> she never started at [facility] clinic - rocaltrol 0.5 mcg po three times per week - venofer 100mg  q hd 8/22- 9/07 - mircera 60 mcg IV q 2 wks, never started   Assessment/Plan: Pulm edema: In setting of not going to HD for > 1 week. Resolved with HD.  L arm thrombophlebitis - on Vancomycin. Appears stable.  ESRD: TTS schedule. HD today.  HD access: [facility] did 2nd stage BVT here 8/28 - appreciate [facility]. Still using TDC until this heals (~1 mo).  HTN/volume: BP soft stable.  LE edema resolved.  Keeping even w/ HD today. Pt still voiding a sig amount.  Anemia of ESRD: Hgb very low on admit. Given 1U PRBCs 8/25. Now 7.8. Continue Aranesp q Monday while here. Not tolerating iron infusions, have d/c'd. Secondary hyperparathyroidism: Calcium and phos in goal.  Continue binders and VDRA. Nutrition: Alb low, continue supplements. Renal diet w/fluid restrictions.  Homelessness: No permanent address or phone - arranging HD transportation is impossible for her at this moment, SW/CM involved. She is not interested in shelter. She is reaching out to a friend for possible room options. Initially when she was requesting transfer back to [place] (was reassigned to [facility] during last  admission).  Will consult w/ French Ana our renal SW.   Cynthia Moselle  MD  [facility] 11/19/2022, 11:57 AM  Recent Labs  Lab 11/14/22 1125 11/15/22 0707 11/16/22 0900 11/17/22 0329 11/19/22 0411  HGB 7.5*   < > 7.3* 7.8* 7.3*  ALBUMIN 2.1*  --  2.2*  --   --   CALCIUM 8.6*   < > 8.9 9.0  --   PHOS 4.2  --  3.6  --   --   CREATININE 3.92*   < > 3.41* 2.30*  --   K 5.1   < > 3.9 4.4  --    < > = values in this interval not displayed.    Inpatient medications:  (feeding supplement) PROSource Plus  30 mL Oral BID BM   atorvastatin  10 mg Oral Daily   busPIRone  10 mg Oral BID   calcium acetate  667 mg Oral TID WC   Chlorhexidine Gluconate Cloth  6 each Topical Q0600   darbepoetin (ARANESP) injection - DIALYSIS  150 mcg Subcutaneous Q Mon-1800   famotidine  20 mg Oral Daily   feeding supplement (NEPRO CARB STEADY)  237 mL Oral TID BM   fluticasone furoate-vilanterol  1 puff Inhalation Daily   And   umeclidinium bromide  1 puff Inhalation Daily   gabapentin  100 mg Oral TID   heparin  5,000 Units Subcutaneous Q8H   heparin sodium (porcine)       leptospermum manuka honey  1 Application Topical Daily   multivitamin  1 tablet Oral QHS   nicotine  21 mg Transdermal Daily   pantoprazole  40 mg Oral Daily   senna-docusate  1 tablet Oral BID    sodium chloride Stopped (11/15/22 2018)   anticoagulant sodium citrate     [START ON 11/21/2022] vancomycin     vancomycin 750 mg (11/19/22 1127)   sodium chloride, acetaminophen **OR** acetaminophen, albuterol, alteplase, anticoagulant sodium citrate, feeding supplement (NEPRO CARB STEADY), heparin, heparin sodium (porcine), HYDROmorphone (DILAUDID) injection, hydrOXYzine, lidocaine, melatonin, ondansetron (ZOFRAN) IV, oxyCODONE-acetaminophen, traZODone

## 2022-11-19 NOTE — Progress Notes (Signed)
Pharmacy Antibiotic Note  [name] is a 62 y.o. female admitted on 11/09/2022 with L arm cellulitis.  Pharmacy has been consulted for vancomycin dosing. Patient is ESRD, last HD Saturday 8/31, due for HD today.   Today is day 5 of vancomycin (day 7 of abx). Random vancomycin level below goal at 12 mcg/ml (goal 15-25 mcg/ml). Mild improvement noted yesterday. She is afebrile and WBC are normal.  Plan: Vancomycin 750 mg IV after HD today then resume 500 mg after HD TTS Plan is to change to doxycycline at discharge although may not need   Height: 5\' 7"  (170.2 cm) Weight: 52.4 kg (115 lb 9.6 oz) IBW/kg (Calculated) : 61.6  Temp (24hrs), Avg:98.4 F (36.9 C), Min:98.3 F (36.8 C), Max:98.6 F (37 C)  Recent Labs  Lab 11/14/22 0331 11/14/22 1125 11/15/22 0707 11/16/22 0900 11/17/22 0329 11/19/22 0411  WBC 7.2 6.4 7.0 6.0 5.9 6.1  CREATININE 4.08* 3.92* 2.57* 3.41* 2.30*  --   VANCORANDOM  --   --   --   --   --  12    Estimated Creatinine Clearance: 21 mL/min (A) (by C-G formula based on SCr of 2.3 mg/dL (H)).    Allergies  Allergen Reactions   Zestril [Lisinopril] Other (See Comments)    Hyperkalemia    Antimicrobials this admission: Cefazolin 8/28 > 8/30 Vancomycin 8/30 >   Dose adjustments this admission: 9/3 vancomycin 750 mg IV x1  Microbiology results: none  Thank you for involving pharmacy in this patient's care.  Loura Back, PharmD, BCPS Clinical Pharmacist Clinical phone for 11/19/2022 is [phone] 11/19/2022 7:22 AM

## 2022-11-19 NOTE — Progress Notes (Signed)
PT Cancellation Note  Patient Details Name: [name] MRN: [id number] DOB: 1960/07/23   Cancelled Treatment:    Reason Eval/Treat Not Completed: Patient at procedure or test/unavailable - at HD, will check back as able.  Marye Round, PT DPT Acute Rehabilitation Services Secure Chat Preferred  Office [phone]    Dolphus Linch Sheliah Plane 11/19/2022, 11:40 AM

## 2022-11-20 DIAGNOSIS — N186 End stage renal disease: Secondary | ICD-10-CM | POA: Diagnosis not present

## 2022-11-20 DIAGNOSIS — Z992 Dependence on renal dialysis: Secondary | ICD-10-CM | POA: Diagnosis not present

## 2022-11-20 MED ORDER — IPRATROPIUM-ALBUTEROL 0.5-2.5 (3) MG/3ML IN SOLN
3.0000 mL | Freq: Two times a day (BID) | RESPIRATORY_TRACT | Status: DC
  Administered 2022-11-20 – 2022-11-24 (×7): 3 mL via RESPIRATORY_TRACT
  Filled 2022-11-20 (×8): qty 3

## 2022-11-20 MED ORDER — IPRATROPIUM-ALBUTEROL 0.5-2.5 (3) MG/3ML IN SOLN
3.0000 mL | Freq: Four times a day (QID) | RESPIRATORY_TRACT | Status: DC
  Administered 2022-11-20: 3 mL via RESPIRATORY_TRACT
  Filled 2022-11-20: qty 3

## 2022-11-20 NOTE — Progress Notes (Signed)
[facility] Progress Note   Subjective:   pt seen up in chair.   Objective Vitals:   11/20/22 0434 11/20/22 0828 11/20/22 0843 11/20/22 1146  BP: 127/86  113/65   Pulse: 90  75   Resp: 16  16   Temp: 98.2 F (36.8 C)  98.2 F (36.8 C)   TempSrc: Oral  Oral   SpO2: 94% 94% 94% 94%  Weight:      Height:       Physical Exam General:chronically ill appearing female in NAD Heart:RRR, no mrg Lungs:CTAB, nml WOB on RA Abdomen:soft, NTND Extremities:no LE edema, +LUE edema/warmth/erythema in antecubital fossa, +RUE edema around incision Dialysis Access: TDEC, RU AVF maturing +b/t     [facility] ([facility] started [facility])  3h  52kg  400/1.5   2/2 bath  [facility] TDC  Heparin none - last inpt HD was 11/14/22 -> she never started at [facility] clinic - rocaltrol 0.5 mcg po three times per week - venofer 100mg  q hd 8/22- 9/07 - mircera 60 mcg IV q 2 wks, never started   Assessment/Plan: Pulm edema: In setting of not going to HD for > 1 week. Resolved with HD.  L arm thrombophlebitis - on Vancomycin. Per PMD.  ESRD: TTS schedule. HD today.  HD access: [facility] did 2nd stage BVT here 8/28 - appreciate [facility]. Still using TDC until this heals (~1 mo).  HTN/volume: BP soft stable.  LE edema resolved.  Keeping even w/ HD today. Pt still voiding a sig amount.  Anemia of ESRD: Hgb very low on admit. Given 1U PRBCs 8/25. Now 7.8. Continue Aranesp q Monday while here. Not tolerating iron infusions, have d/c'd. Secondary hyperparathyroidism: Calcium and phos in goal.  Continue binders and VDRA. Nutrition: Alb low, continue supplements. Renal diet w/fluid restrictions.  Homelessness: No permanent address or phone - arranging HD transportation is impossible for her at this moment, SW/CM involved. She is not interested in shelter. She is reaching out to a friend for possible room options. She is still CLIP'd to the [facility] unit on [address]. [facility] unit was full (pt asking  for [place]) and [place] was best 2nd choice for CLIP. Have explained to patient and she understands now.  Dispo - ok for dc from renal standpoint  Vinson Moselle  MD  [facility] 11/20/2022, 12:47 PM  Recent Labs  Lab 11/16/22 0900 11/17/22 0329 11/19/22 0411 11/19/22 1327  HGB 7.3* 7.8* 7.3*  --   ALBUMIN 2.2*  --   --  2.4*  CALCIUM 8.9 9.0  --  8.6*  PHOS 3.6  --   --  2.2*  CREATININE 3.41* 2.30*  --  1.47*  K 3.9 4.4  --  4.4    Inpatient medications:  (feeding supplement) PROSource Plus  30 mL Oral BID BM   atorvastatin  10 mg Oral Daily   busPIRone  10 mg Oral BID   calcium acetate  667 mg Oral TID WC   Chlorhexidine Gluconate Cloth  6 each Topical Q0600   darbepoetin (ARANESP) injection - DIALYSIS  150 mcg Subcutaneous Q Mon-1800   famotidine  20 mg Oral Daily   feeding supplement (NEPRO CARB STEADY)  237 mL Oral TID BM   fluticasone furoate-vilanterol  1 puff Inhalation Daily   And   umeclidinium bromide  1 puff Inhalation Daily   gabapentin  100 mg Oral TID   heparin  5,000 Units Subcutaneous Q8H   ipratropium-albuterol  3 mL Nebulization QID   leptospermum manuka honey  1 Application Topical Daily   multivitamin  1 tablet Oral QHS   nicotine  21 mg Transdermal Daily   pantoprazole  40 mg Oral Daily   senna-docusate  1 tablet Oral BID    sodium chloride Stopped (11/15/22 2018)   [START ON 11/21/2022] vancomycin     sodium chloride, acetaminophen **OR** acetaminophen, albuterol, HYDROmorphone (DILAUDID) injection, hydrOXYzine, lidocaine, melatonin, ondansetron (ZOFRAN) IV, oxyCODONE-acetaminophen, traZODone

## 2022-11-20 NOTE — Progress Notes (Signed)
Tele called and reported that patient having new irregular rhythm on heart monitor. After reviewing further tele tech said first occurrence was at 11:10 and strip was saved for review. Strip reviewed by myself and charge RN. MD made aware of changes, EKG ordered. Pt asymptomatic.

## 2022-11-20 NOTE — Progress Notes (Signed)
Pt transferred to a new room. Contacted new TOC staff assigned to case to provide background on pt's out-pt HD situation and to see if there are any updates regarding d/c plan/date and transportation to/from HD. Will assist as needed.   Olivia Canter Renal Navigator [phone]

## 2022-11-20 NOTE — Progress Notes (Signed)
**Note Cynthia-Identified via Obfuscation** Occupational Therapy Treatment Patient Details Name: DETTA [name] MRN: [id number] DOB: 12/31/1960 Today's Date: 11/20/2022   History of present illness Pt is a 62 y/o F presenting to ED on 8.24 with worsening LE edema, admitted for ESRD on HD. s/p R 2nd stage basilic vein fistula on 8/28. recent hospitalization 8/09-8/17 for sepsis 2/2 PNA complicated renal failure that required renal replacement therapy. PMH includes ESRD on HD, COPD, heart failure, HLD   OT comments  Pt limits herself in functional mobility, completed step pivot to recliner with CGA but declined any efforts from OT to mobilize much further and also declined ADLs. Pt needs setup assist for grooming and UBD at this time while being seated. OT was unsuccessful in convincing pt to mobilize more, she was partially receptive to theraband exercises after some convincing. OT may attempt to progress UE strength with banded exercises if pt continues to be receptive to therapy.       If plan is discharge home, recommend the following:  A little help with bathing/dressing/bathroom;Assistance with cooking/housework   Equipment Recommendations  None recommended by OT    Recommendations for Other Services      Precautions / Restrictions Precautions Precautions: Fall Precaution Comments: incision medial r arm, very tender Restrictions Weight Bearing Restrictions: No       Mobility Bed Mobility Overal bed mobility: Needs Assistance Bed Mobility: Supine to Sit     Supine to sit: Supervision, HOB elevated, Used rails     General bed mobility comments: pt left sitting in recliner    Transfers Overall transfer level: Needs assistance Equipment used: None Transfers: Sit to/from Stand, Bed to chair/wheelchair/BSC Sit to Stand: Contact guard assist     Step pivot transfers: Contact guard assist           Balance Overall balance assessment: Needs assistance Sitting-balance support: Bilateral upper extremity  supported Sitting balance-Leahy Scale: Good     Standing balance support: No upper extremity supported Standing balance-Leahy Scale: Fair Standing balance comment: no LOB with pivot t/f                           ADL either performed or assessed with clinical judgement   ADL Overall ADL's : Independent     Grooming: Sitting;Brushing hair;Set up           Upper Body Dressing : Sitting;Set up Upper Body Dressing Details (indicate cue type and reason): donning gown like a jacket                   General ADL Comments: Pt did not want to attempt further mobility besides getting to recliner, was partially receptive to theraband exercises after some convincing. Next session, may introduce resistive bands for strengthening.    Extremity/Trunk Assessment              Vision       Perception     Praxis      Cognition Arousal: Alert Behavior During Therapy: WFL for tasks assessed/performed Overall Cognitive Status: Within Functional Limits for tasks assessed                                          Exercises      Shoulder Instructions       General Comments VSS    Pertinent Vitals/ Pain  Pain Assessment Pain Assessment: Faces Faces Pain Scale: Hurts little more Pain Location: RUE, LUE Pain Descriptors / Indicators: Discomfort, Grimacing, Guarding Pain Intervention(s): Limited activity within patient's tolerance, Monitored during session, Repositioned  Home Living                                          Prior Functioning/Environment              Frequency  Min 1X/week        Progress Toward Goals  OT Goals(current goals can now be found in the care plan section)  Progress towards OT goals: Progressing toward goals  Acute Rehab OT Goals Patient Stated Goal: To get her pain better. OT Goal Formulation: With patient Time For Goal Achievement: 11/26/22 Potential to Achieve Goals: Good   Plan      Co-evaluation                 AM-PAC OT "6 Clicks" Daily Activity     Outcome Measure   Help from another person eating meals?: None Help from another person taking care of personal grooming?: A Little Help from another person toileting, which includes using toliet, bedpan, or urinal?: A Little Help from another person bathing (including washing, rinsing, drying)?: A Little Help from another person to put on and taking off regular upper body clothing?: A Little Help from another person to put on and taking off regular lower body clothing?: A Lot 6 Click Score: 18    End of Session Equipment Utilized During Treatment: Gait belt  OT Visit Diagnosis: Unsteadiness on feet (R26.81)   Activity Tolerance Other (comment) (Pt limits herself, does not want to do much OOB mobility)   Patient Left in chair;with call bell/phone within reach;with chair alarm set   Nurse Communication Mobility status        Time: 9604-5409 OT Time Calculation (min): 17 min  Charges: OT General Charges $OT Visit: 1 Visit OT Treatments $Therapeutic Activity: 8-22 mins  11/20/2022  AB, OTR/L  Acute Rehabilitation Services  Office: [phone]   Tristan Schroeder 11/20/2022, 10:51 AM

## 2022-11-20 NOTE — Progress Notes (Addendum)
Physical Therapy Treatment Patient Details Name: [name] MRN: [id number] DOB: 20-Dec-1960 Today's Date: 11/20/2022   History of Present Illness Pt is a 62 y/o F presenting to ED on 8.24 with worsening LE edema, admitted for ESRD on HD. s/p R 2nd stage basilic vein fistula on 8/28. recent hospitalization 8/09-8/17 for sepsis 2/2 PNA complicated renal failure that required renal replacement therapy. PMH includes ESRD on HD, COPD, heart failure, HLD    PT Comments  Pt received in supine, irritable but agreeable to work on transfer/gait in room with encouragement. Pt goal to mobilize OOB to bathroom and modI for bed mobility/transfers and Supervision for safety with short gait trials x2 in room without AD. Pt perseverating on BUE pain and frustrated that staff member checked her BP on her L arm at some time earlier in the day. RN notified pt requesting BP only in legs (no pen in room to write this on her board). Pt with limited participation and standing tolerance with fatigue, could consider rollator trial next session to see if she is able to progress gait distance. Pt offered cane today but refusing to trial. Pt continues to benefit from PT services to progress toward functional mobility goals.     If plan is discharge home, recommend the following: Assist for transportation;Help with stairs or ramp for entrance;A little help with walking and/or transfers   Can travel by private vehicle        Equipment Recommendations  None recommended by PT (pending progress)    Recommendations for Other Services       Precautions / Restrictions Precautions Precautions: Fall Precaution Comments: RUE fistula and incision, LUE pain (BP in legs ONLY) Restrictions Weight Bearing Restrictions: No     Mobility  Bed Mobility Overal bed mobility: Modified Independent Bed Mobility: Supine to Sit, Sit to Supine     Supine to sit: Modified independent (Device/Increase time) Sit to supine: Modified  independent (Device/Increase time)   General bed mobility comments: to R EOB    Transfers Overall transfer level: Modified independent   Transfers: Sit to/from Stand Sit to Stand: Modified independent (Device/Increase time)           General transfer comment: no LOB or buckling; SBA but pt without acute s/sx distress or instability from EOB and lower toilet seat heights    Ambulation/Gait Ambulation/Gait assistance: Supervision Gait Distance (Feet): 15 Feet (30ft +20ft) Assistive device: None Gait Pattern/deviations: Step-through pattern, Trunk flexed, Narrow base of support       General Gait Details: Pt self-limiting distance to bathroom and then to sink in room then bed due to c/o BUE pain (pt not using AD). Pt encouraged to walk at least to door of room or hallway but pt refuses. Pt states "I've been up" to bathroom multiple times this date. Pt encouraged to walk in hallway with therapies /staff after HD next date.   Stairs             Wheelchair Mobility     Tilt Bed    Modified Rankin (Stroke Patients Only)       Balance Overall balance assessment: Mild deficits observed, not formally tested   Sitting balance-Leahy Scale: Good     Standing balance support: No upper extremity supported, During functional activity Standing balance-Leahy Scale: Fair Standing balance comment: no AD to/from bathroom no buckling or scissoring today  Cognition Arousal: Alert Behavior During Therapy: WFL for tasks assessed/performed, Agitated Overall Cognitive Status: No family/caregiver present to determine baseline cognitive functioning                                 General Comments: Poor insight into deficits and needs to mobilize; pt perseverating on BUE pain.        Exercises Other Exercises Other Exercises: pt refusing    General Comments General comments (skin integrity, edema, etc.): BP defers BP in  standing due to pain and fatigue HR 90's sitting EOB per tele monitor, pt not allowing PTA to hold it while she mobilizes or monitor it.     Pertinent Vitals/Pain Pain Assessment Pain Assessment: Faces Faces Pain Scale: Hurts whole lot Pain Location: RUE, LUE Pain Descriptors / Indicators: Discomfort, Grimacing, Guarding Pain Intervention(s): Limited activity within patient's tolerance, Monitored during session, Repositioned, Patient requesting pain meds-RN notified, Other (comment) (pt agitated with PTA asking if she wants an extra pillow to elevate her RUE more; pt also refuses ice pack for BUE pain)     PT Goals (current goals can now be found in the care plan section) Acute Rehab PT Goals Patient Stated Goal: Less pain in my arms PT Goal Formulation: With patient Time For Goal Achievement: 11/26/22 Progress towards PT goals: Progressing toward goals (slowly)    Frequency    Min 1X/week      PT Plan         AM-PAC PT "6 Clicks" Mobility   Outcome Measure  Help needed turning from your back to your side while in a flat bed without using bedrails?: None Help needed moving from lying on your back to sitting on the side of a flat bed without using bedrails?: None Help needed moving to and from a bed to a chair (including a wheelchair)?: None Help needed standing up from a chair using your arms (e.g., wheelchair or bedside chair)?: None Help needed to walk in hospital room?: A Little Help needed climbing 3-5 steps with a railing? : A Lot 6 Click Score: 21    End of Session Equipment Utilized During Treatment: Gait belt Activity Tolerance: Patient limited by fatigue;Other (comment) (self-limiting) Patient left: in bed;with call bell/phone within reach;with bed alarm set Nurse Communication: Mobility status PT Visit Diagnosis: Muscle weakness (generalized) (M62.81)     Time: 1740-1750 PT Time Calculation (min) (ACUTE ONLY): 10 min  Charges:    $Gait Training: 8-22  mins PT General Charges $$ ACUTE PT VISIT: 1 Visit                     [place] Antolin P., PTA Acute Rehabilitation Services Secure Chat Preferred 9a-5:30pm Office: [phone]    [place] 11/20/2022, 7:12 PM

## 2022-11-20 NOTE — Progress Notes (Signed)
PROGRESS NOTE    [name]  ZOX:[id number] DOB: 1960/09/19 DOA: 11/09/2022 PCP: [name], MD  C2/F with ESRD on hemodialysis, chronic diastolic CHF, COPD, dyslipidemia presented to the ED with worsening lower extremity edema. -Recent admission 8/9-8/17 for sepsis from pneumonia complicated by renal failure, eventually required hemodialysis, since discharge was noncompliant with HD. -Volume overloaded on admission, with creatinine of 5.2, BUN 59, BNP 3 3, troponin 23, hemoglobin 7.1, chest x-ray noted bilateral interstitial infiltrates, right pleural effusion -8/23 hemoglobin down to 6.3, transfuse 1 unit PRBC 8/26 HD with ultrafiltration, 3 L -8/28 underwent second stage basilic vein fistula transposition Dr. Lenell Antu -8/29 developed severe thrombophlebitis in left arm -8/30, antibiotics changed to vancomycin Thrombophlebitis slowly improving   Subjective: Continues to have some pain in her left arm, reports increased cough and pain in the right side of her chest  Assessment and Plan:  End-stage renal disease on hemodialysis (HCC) Volume overload, noncompliance, hyponatremia -Did not do outpatient HD X 10 days prior to admission -Volume status improved -Vascular surgery consulted for AV fistula, second step completed 8/28 -Disposition complicated by homelessness-no phone or permanent address, unable to have HD transportation in this setting, social work following, TOC following  Severe left arm thrombophlebitis, cellulitis -At site of previous IV, IV site changed -Continue cold compresses, day 5 of IV vancomycin, discontinue antibiotics tomorrow -Encouraged elevation  Pleurisy, recent right-sided pneumonia -Add DuoNebs, incentive spirometry -Repeat x-ray tomorrow  Anemia of chronic renal disease,  -sp one unit PRBC transfusion  Iron deficiency anemia, with serum iron 7, TIBC 200, transferrin saturation of 4 and ferritin 187.  SP IV iron 8/26, 8/31 , added  EPO. -Hemoglobin 7.3 today  Acute on chronic diastolic CHF (congestive heart failure) (HCC) Echocardiogram with preserved LV systolic function with EF 55 to 60%, RV systolic function preserved, mild aortic valve stenosis, RVSP 43.1 mmHg.  -Volume management with HD  Essential hypertension -Improving with HD, continue amlodipine  History of COPD Stable Continue with bronchodilator therapy.   Hyperlipidemia Continue with statin therapy.   GAD (generalized anxiety disorder) Continue with hydroxyzine and buspirone.   Homelessness  Pressure injury of skin Present on admission.  Stage 2 pressure ulcer left buttock and coccyx.  Continue local skin care.   DVT prophylaxis: heparin SQ Code Status: Full COde Family Communication: None present Disposition Plan: Homeless  Consultants:    Procedures:   Antimicrobials:    Objective: Vitals:   11/19/22 2210 11/20/22 0434 11/20/22 0828 11/20/22 0843  BP: (!) 140/52 127/86  113/65  Pulse: 85 90  75  Resp: 16 16  16   Temp: 98.9 F (37.2 C) 98.2 F (36.8 C)  98.2 F (36.8 C)  TempSrc: Oral Oral  Oral  SpO2: 95% 94% 94% 94%  Weight:      Height:        Intake/Output Summary (Last 24 hours) at 11/20/2022 1146 Last data filed at 11/19/2022 2210 Gross per 24 hour  Intake 360 ml  Output 0 ml  Net 360 ml   Filed Weights   11/17/22 0607 11/18/22 0436 11/19/22 0414  Weight: (P) 51.3 kg 51.4 kg 52.4 kg    Examination:  Chronically ill female laying in bed, AAOx3 HEENT: No JVD, right IJ HD catheter CVS: S1-S2, regular rhythm Lungs: Decreased breath sounds at the bases Abdomen: Soft, nontender, bowel sounds present  Extremities no edema, right arm AV fistula, surgical scars, left forearm with thrombophlebitis-mild improvement   Data Reviewed:   CBC: Recent Labs  Lab 11/14/22 1125 11/15/22 0707 11/16/22 0900 11/17/22 0329 11/19/22 0411  WBC 6.4 7.0 6.0 5.9 6.1  HGB 7.5* 7.2* 7.3* 7.8* 7.3*  HCT 23.3* 22.5*  23.4* 24.8* 23.7*  MCV 100.0 96.6 101.7* 100.8* 102.2*  PLT 340 351 368 398 350   Basic Metabolic Panel: Recent Labs  Lab 11/14/22 1125 11/15/22 0707 11/16/22 0900 11/17/22 0329 11/19/22 1327  NA 134* 133* 132* 135 134*  K 5.1 3.9 3.9 4.4 4.4  CL 101 99 99 97* 94*  CO2 19* 23 24 23 26   GLUCOSE 104* 86 130* 89 92  BUN 56* 24* 40* 22 15  CREATININE 3.92* 2.57* 3.41* 2.30* 1.47*  CALCIUM 8.6* 8.4* 8.9 9.0 8.6*  PHOS 4.2  --  3.6  --  2.2*   GFR: Estimated Creatinine Clearance: 32.8 mL/min (A) (by C-G formula based on SCr of 1.47 mg/dL (H)). Liver Function Tests: Recent Labs  Lab 11/14/22 1125 11/16/22 0900 11/19/22 1327  ALBUMIN 2.1* 2.2* 2.4*   No results for input(s): "LIPASE", "AMYLASE" in the last 168 hours. No results for input(s): "AMMONIA" in the last 168 hours. Coagulation Profile: No results for input(s): "INR", "PROTIME" in the last 168 hours.  Cardiac Enzymes: No results for input(s): "CKTOTAL", "CKMB", "CKMBINDEX", "TROPONINI" in the last 168 hours. BNP (last 3 results) No results for input(s): "PROBNP" in the last 8760 hours. HbA1C: No results for input(s): "HGBA1C" in the last 72 hours. CBG: No results for input(s): "GLUCAP" in the last 168 hours.  Lipid Profile: No results for input(s): "CHOL", "HDL", "LDLCALC", "TRIG", "CHOLHDL", "LDLDIRECT" in the last 72 hours. Thyroid Function Tests: No results for input(s): "TSH", "T4TOTAL", "FREET4", "T3FREE", "THYROIDAB" in the last 72 hours. Anemia Panel: No results for input(s): "VITAMINB12", "FOLATE", "FERRITIN", "TIBC", "IRON", "RETICCTPCT" in the last 72 hours. Urine analysis:    Component Value Date/Time   COLORURINE YELLOW 10/25/2022 2015   APPEARANCEUR HAZY (A) 10/25/2022 2015   LABSPEC 1.009 10/25/2022 2015   PHURINE 5.0 10/25/2022 2015   GLUCOSEU 50 (A) 10/25/2022 2015   HGBUR LARGE (A) 10/25/2022 2015   BILIRUBINUR NEGATIVE 10/25/2022 2015   KETONESUR NEGATIVE 10/25/2022 2015   PROTEINUR 100  (A) 10/25/2022 2015   UROBILINOGEN 0.2 03/02/2010 2027   NITRITE POSITIVE (A) 10/25/2022 2015   LEUKOCYTESUR MODERATE (A) 10/25/2022 2015   Sepsis Labs: @LABRCNTIP (procalcitonin:4,lacticidven:4)  )No results found for this or any previous visit (from the past 240 hour(s)).   Radiology Studies: DG CHEST PORT 1 VIEW  Result Date: 11/18/2022 CLINICAL DATA:  Cough. EXAM: PORTABLE CHEST 1 VIEW COMPARISON:  Radiograph 11/09/2022.  Chest CT 11/15/2021 FINDINGS: Right-sided dialysis catheter remains in place. Progressive volume loss at the right lung base with increasing opacity, may be secondary to increasing pleural effusion or mucous plugging/collapse. Background peribronchial thickening. Trace left pleural effusion. No pneumothorax. IMPRESSION: 1. Progressive volume loss at the right lung base with increasing opacity, may be secondary to increasing pleural effusion or mucous plugging/collapse. 2. Trace left pleural effusion. Electronically Signed   By: Narda Rutherford M.D.   On: 11/18/2022 15:51     Scheduled Meds:  (feeding supplement) PROSource Plus  30 mL Oral BID BM   atorvastatin  10 mg Oral Daily   busPIRone  10 mg Oral BID   calcium acetate  667 mg Oral TID WC   Chlorhexidine Gluconate Cloth  6 each Topical Q0600   darbepoetin (ARANESP) injection - DIALYSIS  150 mcg Subcutaneous Q Mon-1800   famotidine  20 mg Oral Daily  feeding supplement (NEPRO CARB STEADY)  237 mL Oral TID BM   fluticasone furoate-vilanterol  1 puff Inhalation Daily   And   umeclidinium bromide  1 puff Inhalation Daily   gabapentin  100 mg Oral TID   heparin  5,000 Units Subcutaneous Q8H   ipratropium-albuterol  3 mL Nebulization QID   leptospermum manuka honey  1 Application Topical Daily   multivitamin  1 tablet Oral QHS   nicotine  21 mg Transdermal Daily   pantoprazole  40 mg Oral Daily   senna-docusate  1 tablet Oral BID   Continuous Infusions:  sodium chloride Stopped (11/15/22 2018)   [START ON  11/21/2022] vancomycin       LOS: 10 days    Time spent:    Zannie Cove, MD [facility]   11/20/2022, 11:46 AM

## 2022-11-21 ENCOUNTER — Inpatient Hospital Stay (HOSPITAL_COMMUNITY): Payer: Medicare Other

## 2022-11-21 DIAGNOSIS — N186 End stage renal disease: Secondary | ICD-10-CM | POA: Diagnosis not present

## 2022-11-21 DIAGNOSIS — Z992 Dependence on renal dialysis: Secondary | ICD-10-CM | POA: Diagnosis not present

## 2022-11-21 MED ORDER — NEPRO/CARBSTEADY PO LIQD: 237.0000 mL | ORAL | Status: DC | PRN

## 2022-11-21 MED ORDER — ALTEPLASE 2 MG IJ SOLR: 2.0000 mg | Freq: Once | INTRAMUSCULAR | Status: DC | PRN

## 2022-11-21 MED ORDER — PENTAFLUOROPROP-TETRAFLUOROETH EX AERO: 1.0000 | INHALATION_SPRAY | CUTANEOUS | Status: DC | PRN

## 2022-11-21 MED ORDER — INFLUENZA VIRUS VACC SPLIT PF (FLUZONE) 0.5 ML IM SUSY
0.5000 mL | PREFILLED_SYRINGE | INTRAMUSCULAR | Status: AC
  Administered 2022-11-24: 0.5 mL via INTRAMUSCULAR
  Filled 2022-11-21: qty 0.5

## 2022-11-21 MED ORDER — HEPARIN SODIUM (PORCINE) 1000 UNIT/ML DIALYSIS
1000.0000 [IU] | INTRAMUSCULAR | Status: DC | PRN
  Filled 2022-11-21 (×3): qty 1

## 2022-11-21 MED ORDER — LIDOCAINE-PRILOCAINE 2.5-2.5 % EX CREA: 1.0000 | TOPICAL_CREAM | CUTANEOUS | Status: DC | PRN

## 2022-11-21 MED ORDER — ANTICOAGULANT SODIUM CITRATE 4% (200MG/5ML) IV SOLN: 5.0000 mL | Status: DC | PRN

## 2022-11-21 MED ORDER — LIDOCAINE HCL (PF) 1 % IJ SOLN: 5.0000 mL | INTRAMUSCULAR | Status: DC | PRN

## 2022-11-21 MED ORDER — CHLORHEXIDINE GLUCONATE CLOTH 2 % EX PADS
6.0000 | MEDICATED_PAD | Freq: Every day | CUTANEOUS | Status: DC
  Administered 2022-11-22: 6 via TOPICAL

## 2022-11-21 NOTE — TOC Progression Note (Signed)
Transition of Care [facility]) - Progression Note    Patient Details  Name: [name] MRN: [id number] Date of Birth: Aug 12, 1960  Transition of Care [facility]) CM/SW Contact  Erin Sons, [place] Phone Number: 11/21/2022, 4:16 PM  Clinical Narrative:     CSW met with pt to discuss transportation and disposition. CSW called [facility] medicaid and confirmed pt is eligible for medicaid transportation. CSW provided pt with phone # to schedule transportation appointments.   She explains at some point prior to admission she was staying in [place] paying rent to a man that she met through her friend. She does not remember his exact address and does not have his phone number. Her friend's phone is not on so she can't reach her. Pt's plan on discharge will be to stay in a hotel for 1 night([facility] [address] [place]) and once out of the hospital she will locate her friend and obtain the man's phone number with plans to return staying with him in [place]. [employer] transport requires to be scheduled 2 days in advance; this may be a barrier as pt only knows the address she will be on the first night of discharge. CSW and pt discussed possible discharge on Friday with transport scheduled for Saturday so she extra days to figure out her address for transport to Tuesday HD.   Discussed with Renal Navigator and pt may not be able to start at HD clinic on a Saturday.    Barriers to Discharge: Unsafe home situation, Homeless with medical needs    Social Determinants of Health (SDOH) Interventions SDOH Screenings   Food Insecurity: Food Insecurity Present (11/10/2022)  Housing: Patient Unable To Answer (11/10/2022)  Transportation Needs: Unmet Transportation Needs (11/10/2022)  Utilities: At Risk (11/10/2022)  Depression (PHQ2-9): Medium Risk (01/21/2022)  Tobacco Use: High Risk (11/13/2022)    Readmission Risk Interventions    11/13/2022    3:21 PM 11/13/2022    3:19 PM 08/13/2022    4:47 PM  Readmission  Risk Prevention Plan  Transportation Screening  Complete Complete  Medication Review (RN Care Manager)  Complete Complete  PCP or Specialist appointment within 3-5 days of discharge  Complete Complete  HRI or Home Care Consult -- -- Complete  SW Recovery Care/Counseling Consult  Complete   Palliative Care Screening  Not Applicable Not Applicable  Skilled Nursing Facility  Not Applicable Not Applicable

## 2022-11-21 NOTE — Progress Notes (Signed)
Pt's case discussed with CSW this afternoon. Contacted [facility] to see if clinic can start pt on Saturday if pt d/c tomorrow. Clinic cannot start pt on Saturday due to staffing. Clinic can start pt on Tuesday, Sept 10 (TTS schedule). Pt will have a 9:30 am arrival for paperwork on first day for a 10:40 chair time. This info was provided to CSW and will provide pt with new times later today/tomorrow. Will assist as needed.   Olivia Canter Renal Navigator [phone]

## 2022-11-21 NOTE — Progress Notes (Signed)
[name] Progress Note   Subjective:   pt seen up in chair.   Objective Vitals:   11/20/22 2021 11/21/22 0500 11/21/22 0653 11/21/22 0839  BP: 115/74  111/71   Pulse: 95  73   Resp: 19  19   Temp: 98.4 F (36.9 C)  97.6 F (36.4 C)   TempSrc:   Oral   SpO2: 96%  97% 97%  Weight:  53.1 kg    Height:       Physical Exam General:chronically ill appearing female in NAD Heart:RRR, no mrg Lungs:CTAB, nml WOB on RA Abdomen:soft, NTND Extremities:no LE edema Dialysis Access: RIJ TDC, RU AVF maturing +b/t     Dialysis Orders: [facility] (never started there)  3h  52kg  400/1.5   2/2 bath  RIJ TDC  Heparin none - last inpt HD was 11/14/22 -> she never started at [facility] - rocaltrol 0.5 mcg po three times per week - venofer 100mg  q hd 8/22- 9/07 - mircera 60 mcg IV q 2 wks, never started   Assessment/Plan: Pulm edema: In setting of not going to HD for > 1 week. Resolved with HD.  L arm thrombophlebitis - on Vancomycin. Per PMD.  ESRD: TTS schedule. HD today.  HD access: [facility] did 2nd stage BVT here 8/28 - appreciate [facility]. Still using TDC until this heals (~1 mo).  HTN/volume: BP soft stable.  LE edema resolved.  Keeping even w/ HD today. Pt still voiding a sig amount.  Anemia of ESRD: Hgb very low on admit. Given 1U PRBCs 8/25. Now 7.8. Continue Aranesp q Monday while here. Not tolerating iron infusions, have d/c'd. Secondary hyperparathyroidism: Calcium and phos in goal.  Continue binders and VDRA. Nutrition: Alb low, continue supplements. Renal diet w/fluid restrictions.  Homelessness: No permanent address or phone - arranging HD transportation is impossible for her at this moment, SW/CM involved. She is not interested in shelter. She is reaching out to a friend for possible room options. She is still CLIP'd to the [facility] unit on [address]. [facility] unit was full (pt was asking for [place]) and [place] was best 2nd choice for CLIP. Have  explained to patient and she understands.   Dispo - stable from renal standpoint; defer to primary team  Vinson Moselle  MD  [facility] 11/21/2022, 12:45 PM  Recent Labs  Lab 11/16/22 0900 11/17/22 0329 11/19/22 0411 11/19/22 1327  HGB 7.3* 7.8* 7.3*  --   ALBUMIN 2.2*  --   --  2.4*  CALCIUM 8.9 9.0  --  8.6*  PHOS 3.6  --   --  2.2*  CREATININE 3.41* 2.30*  --  1.47*  K 3.9 4.4  --  4.4    Inpatient medications:  (feeding supplement) PROSource Plus  30 mL Oral BID BM   atorvastatin  10 mg Oral Daily   busPIRone  10 mg Oral BID   calcium acetate  667 mg Oral TID WC   Chlorhexidine Gluconate Cloth  6 each Topical Q0600   Chlorhexidine Gluconate Cloth  6 each Topical Q0600   darbepoetin (ARANESP) injection - DIALYSIS  150 mcg Subcutaneous Q Mon-1800   famotidine  20 mg Oral Daily   feeding supplement (NEPRO CARB STEADY)  237 mL Oral TID BM   fluticasone furoate-vilanterol  1 puff Inhalation Daily   And   umeclidinium bromide  1 puff Inhalation Daily   gabapentin  100 mg Oral TID   heparin  5,000  Units Subcutaneous Q8H   ipratropium-albuterol  3 mL Nebulization BID   leptospermum manuka honey  1 Application Topical Daily   multivitamin  1 tablet Oral QHS   nicotine  21 mg Transdermal Daily   pantoprazole  40 mg Oral Daily   senna-docusate  1 tablet Oral BID    sodium chloride Stopped (11/15/22 2018)   vancomycin     sodium chloride, acetaminophen **OR** acetaminophen, albuterol, HYDROmorphone (DILAUDID) injection, hydrOXYzine, lidocaine, melatonin, ondansetron (ZOFRAN) IV, oxyCODONE-acetaminophen, traZODone

## 2022-11-21 NOTE — Progress Notes (Signed)
PROGRESS NOTE    [name]  ZOX:[id number] DOB: 1961-01-17 DOA: 11/09/2022 PCP: [name], MD   Brief Narrative:  This 62 yrs old Female with ESRD on hemodialysis, Chronic diastolic CHF, COPD, dyslipidemia presented to the ED with worsening lower extremity edema. -She was recently admitted from 8/9-8/17 for sepsis from pneumonia complicated by renal failure, eventually required hemodialysis, since discharge was noncompliant with HD. -Volume overloaded on admission, with creatinine of 5.2, BUN 59, BNP 33, troponin 23, hemoglobin 7.1, chest x-ray noted bilateral interstitial infiltrates, right pleural effusion. -8/23 hemoglobin down to 6.3, transfuse 1 unit PRBC 8/26 HD with ultrafiltration, 3 L -8/28 underwent second stage basilic vein fistula transposition Dr. Lenell Antu -8/29 developed severe thrombophlebitis in left arm -8/30, antibiotics changed to vancomycin. Thrombophlebitis slowly improving  Assessment & Plan:   Principal Problem:   End-stage renal disease on hemodialysis (HCC) Active Problems:   Acute on chronic diastolic CHF (congestive heart failure) (HCC)   Essential hypertension   History of COPD   Hyperlipidemia   GAD (generalized anxiety disorder)   Pressure injury of skin  End-stage renal disease on hemodialysis (HCC) Volume overload, noncompliance, hyponatremia: -She had not done outpatient HD X 10 days prior to admission. -Volume status improved with HD -Vascular surgery consulted for AV fistula, second step completed 8/28 -Disposition complicated by homelessness- No phone or permanent address, unable to have HD transportation in this setting, social work following, TOC following.   Severe left arm thrombophlebitis, cellulitis -At site of previous IV, IV site changed. -Continue cold compresses, day 5 of IV vancomycin, discontinue antibiotics today. -Encouraged Arm elevation.   Pleurisy, recent right-sided pneumonia: - Continue DuoNebs, incentive  spirometry -Repeat chest x-ray showed persistent densities in the right lower lobe most likely represent pleural fluid and consolidation.   Anemia of chronic renal disease: -sp one unit PRBC transfusion.  Iron deficiency anemia, with serum iron 7, TIBC 200, transferrin saturation of 4 and ferritin 187.  SP IV iron 8/26, 8/31 , added EPO. -Hemoglobin 7.3  on 11/19/22. -Monitor H/H daily   Acute on chronic diastolic CHF (congestive heart failure) [facility]): Echocardiogram with preserved LV systolic function with EF 55 to 60%, RV systolic function preserved, mild aortic valve stenosis, RVSP 43.1 mmHg.  -Volume management with HD.   Essential hypertension: -Improving with HD, continue amlodipine   History of COPD: Stable. Continue with bronchodilator therapy.    Hyperlipidemia: Continue with statin therapy.    GAD (generalized anxiety disorder): Continue with hydroxyzine and buspirone.    Homelessness   Pressure injury of skin: Present on admission.  Stage 2 pressure ulcer left buttock and coccyx.  Continue local skin care.    DVT prophylaxis: Heparin sq Code Status:  Full code Family Communication: No family at bed side Disposition Plan:    Status is: Inpatient Remains inpatient appropriate because: Homeless, needs placement.   Consultants:  Nephrology Vascular  Procedures: HD  Antimicrobials:  Anti-infectives (From admission, onward)    Start     Dose/Rate Route Frequency Ordered Stop   11/21/22 1200  vancomycin (VANCOREADY) IVPB 500 mg/100 mL        500 mg 100 mL/hr over 60 Minutes Intravenous Every T-Th-Sa (Hemodialysis) 11/19/22 0721     11/19/22 1200  vancomycin (VANCOREADY) IVPB 750 mg/150 mL        750 mg 150 mL/hr over 60 Minutes Intravenous  Once 11/19/22 0721 11/19/22 1227   11/16/22 1200  vancomycin (VANCOREADY) IVPB 500 mg/100 mL  Status:  Discontinued  500 mg 100 mL/hr over 60 Minutes Intravenous Every T-Th-Sa (Hemodialysis) 11/15/22 0929  11/19/22 0721   11/15/22 1015  vancomycin (VANCOREADY) IVPB 1250 mg/250 mL        1,250 mg 166.7 mL/hr over 90 Minutes Intravenous  Once 11/15/22 0920 11/15/22 1314   11/14/22 1800  ceFAZolin (ANCEF) IVPB 1 g/50 mL premix  Status:  Discontinued       Note to Pharmacy: Adjust dose for ESRD   1 g 100 mL/hr over 30 Minutes Intravenous Every 24 hours 11/14/22 0854 11/15/22 0849   11/13/22 0656  ceFAZolin (ANCEF) 2-4 GM/100ML-% IVPB       Note to Pharmacy: Isabel Caprice, Destiny: cabinet override      11/13/22 0656 11/13/22 0818   11/13/22 0600  cefUROXime (ZINACEF) 1.5 g in sodium chloride 0.9 % 100 mL IVPB        1.5 g 200 mL/hr over 30 Minutes Intravenous On call to O.R. 11/12/22 1302 11/14/22 0559   11/13/22 0600  ceFAZolin (ANCEF) IVPB 2g/100 mL premix        2 g 200 mL/hr over 30 Minutes Intravenous To Short Stay 11/13/22 0458 11/13/22 0818       Subjective: Patient was seen and examined at bedside.  Overnight events noted. Patient complains of having severe pain in the back left arm and right arm.   Objective: Vitals:   11/20/22 2021 11/21/22 0500 11/21/22 0653 11/21/22 0839  BP: 115/74  111/71   Pulse: 95  73   Resp: 19  19   Temp: 98.4 F (36.9 C)  97.6 F (36.4 C)   TempSrc:   Oral   SpO2: 96%  97% 97%  Weight:  53.1 kg    Height:        Intake/Output Summary (Last 24 hours) at 11/21/2022 1453 Last data filed at 11/21/2022 0849 Gross per 24 hour  Intake 480 ml  Output 0 ml  Net 480 ml   Filed Weights   11/18/22 0436 11/19/22 0414 11/21/22 0500  Weight: 51.4 kg 52.4 kg 53.1 kg    Examination:  General exam: Appears calm and comfortable, deconditioned, not in any acute distress. Respiratory system: Decreased breath sounds. Respiratory effort normal.  RR 16 Cardiovascular system: S1 & S2 heard, RRR. No JVD, murmurs, rubs, gallops or clicks. Gastrointestinal system: Abdomen is nondistended, soft and nontender.  Normal bowel sounds heard. Central nervous system: Alert  and oriented X 3. No focal neurological deficits. Extremities: Right arm AV fistula, surgical scars, left forearm with thrombophlebitis,  mild improvement Skin: No rashes, lesions or ulcers Psychiatry: Judgement and insight appear normal. Mood & affect appropriate.     Data Reviewed: I have personally reviewed following labs and imaging studies  CBC: Recent Labs  Lab 11/15/22 0707 11/16/22 0900 11/17/22 0329 11/19/22 0411  WBC 7.0 6.0 5.9 6.1  HGB 7.2* 7.3* 7.8* 7.3*  HCT 22.5* 23.4* 24.8* 23.7*  MCV 96.6 101.7* 100.8* 102.2*  PLT 351 368 398 350   Basic Metabolic Panel: Recent Labs  Lab 11/15/22 0707 11/16/22 0900 11/17/22 0329 11/19/22 1327  NA 133* 132* 135 134*  K 3.9 3.9 4.4 4.4  CL 99 99 97* 94*  CO2 23 24 23 26   GLUCOSE 86 130* 89 92  BUN 24* 40* 22 15  CREATININE 2.57* 3.41* 2.30* 1.47*  CALCIUM 8.4* 8.9 9.0 8.6*  PHOS  --  3.6  --  2.2*   GFR: Estimated Creatinine Clearance: 33.3 mL/min (A) (by C-G formula based on SCr  of 1.47 mg/dL (H)). Liver Function Tests: Recent Labs  Lab 11/16/22 0900 11/19/22 1327  ALBUMIN 2.2* 2.4*   No results for input(s): "LIPASE", "AMYLASE" in the last 168 hours. No results for input(s): "AMMONIA" in the last 168 hours. Coagulation Profile: No results for input(s): "INR", "PROTIME" in the last 168 hours. Cardiac Enzymes: No results for input(s): "CKTOTAL", "CKMB", "CKMBINDEX", "TROPONINI" in the last 168 hours. BNP (last 3 results) No results for input(s): "PROBNP" in the last 8760 hours. HbA1C: No results for input(s): "HGBA1C" in the last 72 hours. CBG: No results for input(s): "GLUCAP" in the last 168 hours. Lipid Profile: No results for input(s): "CHOL", "HDL", "LDLCALC", "TRIG", "CHOLHDL", "LDLDIRECT" in the last 72 hours. Thyroid Function Tests: No results for input(s): "TSH", "T4TOTAL", "FREET4", "T3FREE", "THYROIDAB" in the last 72 hours. Anemia Panel: No results for input(s): "VITAMINB12", "FOLATE",  "FERRITIN", "TIBC", "IRON", "RETICCTPCT" in the last 72 hours. Sepsis Labs: No results for input(s): "PROCALCITON", "LATICACIDVEN" in the last 168 hours.  No results found for this or any previous visit (from the past 240 hour(s)).   Radiology Studies: DG CHEST PORT 1 VIEW  Result Date: 11/21/2022 CLINICAL DATA:  Cough.  History of end-stage renal disease. EXAM: PORTABLE CHEST 1 VIEW COMPARISON:  11/18/2022 FINDINGS: Again noted is a right jugular dialysis catheter. The catheter tip is in the SVC region. Persistent densities in the right lower chest most likely represent pleural fluid and consolidation. Right basilar chest densities may have slightly increased. Left lung is clear. Negative for a pneumothorax. Heart size is within normal limits and stable. Circular radiopaque structure in the right lower chest is probably external to the patient. IMPRESSION: Persistent densities in the right lower chest most likely represent pleural fluid and consolidation. Right basilar chest densities may have slightly increased. Electronically Signed   By: Richarda Overlie M.D.   On: 11/21/2022 10:42     Scheduled Meds:  (feeding supplement) PROSource Plus  30 mL Oral BID BM   atorvastatin  10 mg Oral Daily   busPIRone  10 mg Oral BID   calcium acetate  667 mg Oral TID WC   Chlorhexidine Gluconate Cloth  6 each Topical Q0600   Chlorhexidine Gluconate Cloth  6 each Topical Q0600   darbepoetin (ARANESP) injection - DIALYSIS  150 mcg Subcutaneous Q Mon-1800   famotidine  20 mg Oral Daily   feeding supplement (NEPRO CARB STEADY)  237 mL Oral TID BM   fluticasone furoate-vilanterol  1 puff Inhalation Daily   And   umeclidinium bromide  1 puff Inhalation Daily   gabapentin  100 mg Oral TID   heparin  5,000 Units Subcutaneous Q8H   ipratropium-albuterol  3 mL Nebulization BID   leptospermum manuka honey  1 Application Topical Daily   multivitamin  1 tablet Oral QHS   nicotine  21 mg Transdermal Daily    pantoprazole  40 mg Oral Daily   senna-docusate  1 tablet Oral BID   Continuous Infusions:  sodium chloride Stopped (11/15/22 2018)   anticoagulant sodium citrate     vancomycin       LOS: 11 days    Time spent: 50 mins    Willeen Niece, MD [facility]   If 7PM-7AM, please contact night-coverage

## 2022-11-21 NOTE — Plan of Care (Signed)

## 2022-11-21 NOTE — Progress Notes (Signed)
Initial Nutrition Assessment  DOCUMENTATION CODES:   Severe malnutrition in context of social or environmental circumstances  INTERVENTION:  Continue Nepro Shake po TID , each supplement provides 425 kcal and 19 grams protein Continue Prosource Plus PO BID, each supplement provides 100 kcal, 15 g protein  Continue regular diet   NUTRITION DIAGNOSIS:   Severe Malnutrition related to social / environmental circumstances as evidenced by severe fat depletion, severe muscle depletion, energy intake < or equal to 75% for > or equal to 1 month. -ongoing   GOAL:   Patient will meet greater than or equal to 90% of their needs -progressing   MONITOR:   PO intake, Supplement acceptance, Labs, Weight trends, I & O's  REASON FOR ASSESSMENT:   Malnutrition Screening Tool    ASSESSMENT:   Pt admitted with volume overload secondary to ESRD. PMH significant for ESRD on HD, COPD, HF, HLD. Recently admitted 8/9-8/17 for sepsis d/t PNA c/b renal failure requiring RRT.  Visited patient at bedside who reports she has been eating very well and drinking ONS as ordered. Patient denies N/V/D/C. She was asking if insurance could provide her Nepro outside of the hospital. RD reached out to social work who reports that patient can apply to receive Nepro through insurance at dialysis clinic.    Labs: reviewed  Meds: reviewed   Wt: admit 119#, current 117# PO: 75-100% intake at mealtimes   I/O's: -147 ml (net cumulative)  Diet Order:   Diet Order             Diet regular Room service appropriate? Yes; Fluid consistency: Thin  Diet effective now                   EDUCATION NEEDS:   Education needs have been addressed  Skin:  Skin Assessment: Skin Integrity Issues: Skin Integrity Issues:: Stage II Stage II: mid coccyx; L buttock  Last BM:  9/4  Height:   Ht Readings from Last 1 Encounters:  11/10/22 5\' 7"  (1.702 m)    Weight:   Wt Readings from Last 1 Encounters:  11/21/22  53.1 kg   BMI:  Body mass index is 18.33 kg/m.  Estimated Nutritional Needs:   Kcal:  1700-1900  Protein:  85-100g  Fluid:  1L + UOP  Leodis Rains, RDN, LDN  Clinical Nutrition

## 2022-11-21 NOTE — Progress Notes (Signed)
Physical Therapy Treatment Patient Details Name: [name] MRN: [id number] DOB: 10-May-1960 Today's Date: 11/21/2022   History of Present Illness Pt is a 62 y/o F presenting to ED on 8.24 with worsening LE edema, admitted for ESRD on HD. s/p R 2nd stage basilic vein fistula on 8/28. recent hospitalization 8/09-8/17 for sepsis 2/2 PNA complicated renal failure that required renal replacement therapy. PMH includes ESRD on HD, COPD, heart failure, HLD    PT Comments  Pt doing well with mobility. Amb entire unit with only supervision for safety with no overt loss of balance. From PT standpoint ready for dc when medically ready and disposition in place.     If plan is discharge home, recommend the following: Assist for transportation   Can travel by private vehicle        Equipment Recommendations  None recommended by PT    Recommendations for Other Services       Precautions / Restrictions Precautions Precautions: Other (comment) Precaution Comments: RUE fistula and incision, LUE pain (BP in legs ONLY)     Mobility  Bed Mobility Overal bed mobility: Modified Independent Bed Mobility: Supine to Sit     Supine to sit: Modified independent (Device/Increase time)          Transfers Overall transfer level: Modified independent Equipment used: None Transfers: Sit to/from Stand Sit to Stand: Modified independent (Device/Increase time)                Ambulation/Gait Ambulation/Gait assistance: Supervision Gait Distance (Feet): 400 Feet Assistive device: None Gait Pattern/deviations: Step-through pattern, Decreased stride length Gait velocity: decr Gait velocity interpretation: >2.62 ft/sec, indicative of community ambulatory   General Gait Details: Supervision for safety. No overt loss of balance   Stairs             Wheelchair Mobility     Tilt Bed    Modified Rankin (Stroke Patients Only)       Balance Overall balance assessment: Mild  deficits observed, not formally tested                                          Cognition Arousal: Alert Behavior During Therapy: WFL for tasks assessed/performed Overall Cognitive Status: No family/caregiver present to determine baseline cognitive functioning                                          Exercises      General Comments        Pertinent Vitals/Pain Pain Assessment Pain Assessment: Faces Faces Pain Scale: Hurts a little bit Pain Location: LUE Pain Descriptors / Indicators: Discomfort Pain Intervention(s): Limited activity within patient's tolerance    Home Living                          Prior Function            PT Goals (current goals can now be found in the care plan section) Progress towards PT goals: Progressing toward goals    Frequency    Min 1X/week      PT Plan      Co-evaluation              AM-PAC PT "6 Clicks" Mobility   Outcome Measure  Help needed turning from your back to your side while in a flat bed without using bedrails?: None Help needed moving from lying on your back to sitting on the side of a flat bed without using bedrails?: None Help needed moving to and from a bed to a chair (including a wheelchair)?: None Help needed standing up from a chair using your arms (e.g., wheelchair or bedside chair)?: None Help needed to walk in hospital room?: None Help needed climbing 3-5 steps with a railing? : A Little 6 Click Score: 23    End of Session   Activity Tolerance: Patient tolerated treatment well Patient left: in chair;with call bell/phone within reach   PT Visit Diagnosis: Muscle weakness (generalized) (M62.81)     Time: 1191-4782 PT Time Calculation (min) (ACUTE ONLY): 16 min  Charges:    $Gait Training: 8-22 mins PT General Charges $$ ACUTE PT VISIT: 1 Visit                     [facility] PT Acute Rehabilitation Services Office [phone]    Angelina Ok  [facility] 11/21/2022, 9:51 AM

## 2022-11-22 DIAGNOSIS — Z992 Dependence on renal dialysis: Secondary | ICD-10-CM | POA: Diagnosis not present

## 2022-11-22 DIAGNOSIS — N186 End stage renal disease: Secondary | ICD-10-CM | POA: Diagnosis not present

## 2022-11-22 LAB — CBC
HCT: 23.6 % — ABNORMAL LOW (ref 36.0–46.0)
HCT: 24.9 % — ABNORMAL LOW (ref 36.0–46.0)
Hemoglobin: 7.1 g/dL — ABNORMAL LOW (ref 12.0–15.0)
Hemoglobin: 7.7 g/dL — ABNORMAL LOW (ref 12.0–15.0)
MCH: 31.3 pg (ref 26.0–34.0)
MCH: 31.8 pg (ref 26.0–34.0)
MCHC: 30.1 g/dL (ref 30.0–36.0)
MCHC: 30.9 g/dL (ref 30.0–36.0)
MCV: 104 fL — ABNORMAL HIGH (ref 80.0–100.0)
MCV: 102.9 fL — ABNORMAL HIGH (ref 80.0–100.0)
Platelets: 348 10*3/uL (ref 150–400)
Platelets: 307 10*3/uL (ref 150–400)
RBC: 2.27 MIL/uL — ABNORMAL LOW (ref 3.87–5.11)
RBC: 2.42 MIL/uL — ABNORMAL LOW (ref 3.87–5.11)
RDW: 19.7 % — ABNORMAL HIGH (ref 11.5–15.5)
RDW: 19.4 % — ABNORMAL HIGH (ref 11.5–15.5)
WBC: 6.4 10*3/uL (ref 4.0–10.5)
WBC: 5.2 10*3/uL (ref 4.0–10.5)
nRBC: 0 % (ref 0.0–0.2)
nRBC: 0 % (ref 0.0–0.2)

## 2022-11-22 LAB — MAGNESIUM: Magnesium: 1.6 mg/dL — ABNORMAL LOW (ref 1.7–2.4)

## 2022-11-22 LAB — RENAL FUNCTION PANEL
Albumin: 2.1 g/dL — ABNORMAL LOW (ref 3.5–5.0)
Anion gap: 14 (ref 5–15)
BUN: 55 mg/dL — ABNORMAL HIGH (ref 8–23)
CO2: 22 mmol/L (ref 22–32)
Calcium: 8.8 mg/dL — ABNORMAL LOW (ref 8.9–10.3)
Chloride: 98 mmol/L (ref 98–111)
Creatinine, Ser: 3.76 mg/dL — ABNORMAL HIGH (ref 0.44–1.00)
GFR, Estimated: 13 mL/min — ABNORMAL LOW (ref >60)
Glucose, Bld: 94 mg/dL (ref 70–99)
Phosphorus: 3.7 mg/dL (ref 2.5–4.6)
Potassium: 5.5 mmol/L — ABNORMAL HIGH (ref 3.5–5.1)
Sodium: 134 mmol/L — ABNORMAL LOW (ref 135–145)

## 2022-11-22 LAB — COMPREHENSIVE METABOLIC PANEL
ALT: 22 U/L (ref 0–44)
AST: 40 U/L (ref 15–41)
Albumin: 2.1 g/dL — ABNORMAL LOW (ref 3.5–5.0)
Alkaline Phosphatase: 136 U/L — ABNORMAL HIGH (ref 38–126)
Anion gap: 11 (ref 5–15)
BUN: 20 mg/dL (ref 8–23)
CO2: 27 mmol/L (ref 22–32)
Calcium: 8.4 mg/dL — ABNORMAL LOW (ref 8.9–10.3)
Chloride: 95 mmol/L — ABNORMAL LOW (ref 98–111)
Creatinine, Ser: 1.95 mg/dL — ABNORMAL HIGH (ref 0.44–1.00)
GFR, Estimated: 29 mL/min — ABNORMAL LOW (ref >60)
Glucose, Bld: 83 mg/dL (ref 70–99)
Potassium: 4.2 mmol/L (ref 3.5–5.1)
Sodium: 133 mmol/L — ABNORMAL LOW (ref 135–145)
Total Bilirubin: 0.5 mg/dL (ref 0.3–1.2)
Total Protein: 6.2 g/dL — ABNORMAL LOW (ref 6.5–8.1)

## 2022-11-22 LAB — PHOSPHORUS: Phosphorus: 2.8 mg/dL (ref 2.5–4.6)

## 2022-11-22 MED ORDER — MAGNESIUM SULFATE 2 GM/50ML IV SOLN
2.0000 g | Freq: Once | INTRAVENOUS | Status: AC
  Administered 2022-11-22: 2 g via INTRAVENOUS
  Filled 2022-11-22: qty 50

## 2022-11-22 MED ORDER — CHLORHEXIDINE GLUCONATE CLOTH 2 % EX PADS
6.0000 | MEDICATED_PAD | Freq: Every day | CUTANEOUS | Status: DC
  Administered 2022-11-23 – 2022-11-24 (×2): 6 via TOPICAL

## 2022-11-22 NOTE — Progress Notes (Signed)
[facility] Progress Note   Subjective:   pt seen up in chair.   Objective Vitals:   11/22/22 0355 11/22/22 0430 11/22/22 0857 11/22/22 0917  BP: 129/76 108/75 (!) 129/96   Pulse: 79 85 87 78  Resp: (!) 21 20 18 18   Temp: 98.5 F (36.9 C) 98 F (36.7 C) 98.8 F (37.1 C)   TempSrc: Oral Oral    SpO2: 95% 95% 97% 94%  Weight:      Height:       Physical Exam General:chronically ill appearing female in NAD Heart:RRR, no mrg Lungs:CTAB, nml WOB on RA Abdomen:soft, NTND Extremities:no LE edema Dialysis Access: RIJ TDC, RU AVF new wounds intact, +b/t     Dialysis Orders: [facility] (never started there)  3h  52kg  400/1.5   2/2 bath  RIJ TDC  Heparin none - last inpt HD was 11/14/22 -> she never started at [facility] clinic - rocaltrol 0.5 mcg po three times per week - venofer 100mg  q hd 8/22- 9/07 - mircera 60 mcg IV q 2 wks, never started   Assessment/Plan: Pulm edema: In setting of not going to HD for > 1 week. Resolved with HD.  L arm thrombophlebitis - on Vancomycin. Per PMD.  ESRD: TTS schedule. HD tomorrow if still here. Pt stated may be dc'd today.  HD access: [facility] did 2nd stage BVT here 8/28. Still using TDC approx ~1 mo til healed.  HTN/volume: BP soft stable.  LE edema resolved.  Keeping even w/ HD today. Close to dry wt. No change upon dc.  Anemia of ESRD: Hgb very low on admit. Given 1U PRBCs 8/25. Now 7- 8. Continue Aranesp q Monday while here. Not tolerating iron infusions, have d/c'd. Secondary hyperparathyroidism: Calcium and phos in goal.  Continue binders and VDRA. Nutrition: Alb low, continue supplements. Renal diet w/fluid restrictions.  Homelessness: No permanent address or phone - arranging HD transportation is impossible for her at this moment, SW/CM involved. She is not interested in shelter. She is reaching out to a friend for possible room options. Pt was asking to return to [place] but [facility] unit was full and [place] was  best 2nd choice for CLIP. Have explained to patient and she understands.   Dispo - stable for dc from renal standpoint; defer to primary team  Cynthia Moselle  MD  [facility] 11/22/2022, 12:03 PM  Recent Labs  Lab 11/22/22 0027 11/22/22 0735  HGB 7.1* 7.7*  ALBUMIN 2.1* 2.1*  CALCIUM 8.8* 8.4*  PHOS 3.7 2.8  CREATININE 3.76* 1.95*  K 5.5* 4.2    Inpatient medications:  (feeding supplement) PROSource Plus  30 mL Oral BID BM   atorvastatin  10 mg Oral Daily   busPIRone  10 mg Oral BID   calcium acetate  667 mg Oral TID WC   Chlorhexidine Gluconate Cloth  6 each Topical Q0600   Chlorhexidine Gluconate Cloth  6 each Topical Q0600   darbepoetin (ARANESP) injection - DIALYSIS  150 mcg Subcutaneous Q Mon-1800   famotidine  20 mg Oral Daily   feeding supplement (NEPRO CARB STEADY)  237 mL Oral TID BM   fluticasone furoate-vilanterol  1 puff Inhalation Daily   And   umeclidinium bromide  1 puff Inhalation Daily   gabapentin  100 mg Oral TID   heparin  5,000 Units Subcutaneous Q8H   influenza vac split trivalent PF  0.5 mL Intramuscular Tomorrow-1000   ipratropium-albuterol  3 mL Nebulization BID  leptospermum manuka honey  1 Application Topical Daily   multivitamin  1 tablet Oral QHS   nicotine  21 mg Transdermal Daily   pantoprazole  40 mg Oral Daily   senna-docusate  1 tablet Oral BID    sodium chloride Stopped (11/15/22 2018)   vancomycin Stopped (11/22/22 0338)   sodium chloride, acetaminophen **OR** acetaminophen, albuterol, HYDROmorphone (DILAUDID) injection, hydrOXYzine, lidocaine, melatonin, ondansetron (ZOFRAN) IV, oxyCODONE-acetaminophen, traZODone

## 2022-11-22 NOTE — Progress Notes (Signed)
Received patient in bed to unit.  Alert and oriented.  Informed consent signed and in chart.   TX duration: 3 hours  Patient tolerated well.  Transported back to the room  Alert, without acute distress.  Hand-off given to patient's nurse.   Access used: RIJ TDC Access issues: None  Total UF removed: None Medication(s) given: None Post HD VS: please Data Insert     11/22/22 0355  Vitals  Temp 98.5 F (36.9 C)  Temp Source Oral  BP 129/76  MAP (mmHg) 91  BP Location Left Leg  BP Method Automatic  Patient Position (if appropriate) Lying  Pulse Rate 79  Pulse Rate Source Monitor  ECG Heart Rate 75  Resp (!) 21  Oxygen Therapy  SpO2 95 %  O2 Device Room Air  Patient Activity (if Appropriate) In bed  Pulse Oximetry Type Continuous  During Treatment Monitoring  Blood Flow Rate (mL/min) 0 mL/min  Arterial Pressure (mmHg) -22.42 mmHg  Venous Pressure (mmHg) 44.04 mmHg  TMP (mmHg) -3.43 mmHg  Ultrafiltration Rate (mL/min) 173 mL/min  Dialysate Flow Rate (mL/min) 300 ml/min  Duration of HD Treatment -hour(s) 3 hour(s)  Cumulative Fluid Removed (mL) per Treatment  0.02  Post Treatment  Dialyzer Clearance Lightly streaked  Hemodialysis Intake (mL) 0 mL  Liters Processed 72  Fluid Removed (mL) 0 mL  Tolerated HD Treatment Yes  Note  Patient Observations Patient alert, no c/o voiced, no acute distress noted; condition stable for return transport.  Hemodialysis Catheter Right Internal jugular Double lumen Permanent (Tunneled)  Placement Date/Time: 08/14/22 1144   Placed prior to admission: No  Serial / Lot #: [id number]  Expiration Date: 01/22/27  Time Out: Correct patient;Correct site;Correct procedure  Maximum sterile barrier precautions: Hand hygiene;Cap;Mask;Sterile gow...  Site Condition No complications  Blue Lumen Status Flushed;Heparin locked;Dead end cap in place  Red Lumen Status Flushed;Heparin locked;Dead end cap in place  Purple Lumen Status N/A  Catheter  fill solution Heparin 1000 units/ml  Catheter fill volume (Arterial) 1.6 cc  Catheter fill volume (Venous) 1.6  Dressing Type Transparent  Dressing Status Antimicrobial disc in place;Clean, Dry, Intact  Drainage Description None  Post treatment catheter status Capped and Clamped      [name] [facility]

## 2022-11-22 NOTE — Progress Notes (Signed)
Met with pt at bedside today to make pt aware of change in out-pt HD appt times. Pt provided an information sheet with clinic name, address, phone number, days (TTS), and times (10:40 chair time). Pt advised that clinic can start her on Tuesday and she will need to arrive at 9:30 to complete paperwork prior to treatment (this info was on information sheet as well). Arrangements added to AVS as well. Update provided to CSW regarding pt being advised of out-pt HD appts. Will assist as needed.   Olivia Canter Renal Navigator [phone]

## 2022-11-22 NOTE — Plan of Care (Signed)

## 2022-11-22 NOTE — TOC Progression Note (Signed)
Transition of Care [facility]) - Progression Note    Patient Details  Name: DACIA HALLEY MRN: [id number] Date of Birth: 15-Sep-1960  Transition of Care [facility]) CM/SW Contact  Erin Sons, [place] Phone Number: 11/22/2022, 3:52 PM  Clinical Narrative:     CSW called pt to discuss transportation. Pt's plan remains the same, to stay in a hotel at discharge and locate her friend so she can set up her living situation at [facility] location. She can utilize [employer] transport which CSW confirmed needs 2 days advance notice for scheduling. CSW discussed that if pt does not know where she will be 2 days in advance of her first dialysis appointment, she may need to pay for an uber or a taxi to get to her first appointment and then she can utilize [employer]. She also requests for CSW to resubmit application to [employer] [facility] as she is familiar with that transportation service. CSW resubmitted [employer] application to [employer] with [employer].     Barriers to Discharge: Unsafe home situation, Homeless with medical needs  Expected Discharge Plan and Services                                               Social Determinants of Health (SDOH) Interventions SDOH Screenings   Food Insecurity: Food Insecurity Present (11/10/2022)  Housing: Patient Unable To Answer (11/10/2022)  Transportation Needs: Unmet Transportation Needs (11/10/2022)  Utilities: At Risk (11/10/2022)  Depression (PHQ2-9): Medium Risk (01/21/2022)  Tobacco Use: High Risk (11/13/2022)    Readmission Risk Interventions    11/13/2022    3:21 PM 11/13/2022    3:19 PM 08/13/2022    4:47 PM  Readmission Risk Prevention Plan  Transportation Screening  Complete Complete  Medication Review (RN Care Manager)  Complete Complete  PCP or Specialist appointment within 3-5 days of discharge  Complete Complete  HRI or Home Care Consult -- -- Complete  SW Recovery Care/Counseling Consult  Complete   Palliative Care  Screening  Not Applicable Not Applicable  Skilled Nursing Facility  Not Applicable Not Applicable

## 2022-11-22 NOTE — Progress Notes (Signed)
PROGRESS NOTE    [name]  ZOX:[id number] DOB: [birth year] DOA: 11/09/2022 PCP: [name], MD   Brief Narrative:  This 62 yrs old Female with ESRD on hemodialysis, Chronic diastolic CHF, COPD, dyslipidemia presented to the ED with worsening lower extremity edema. -She was recently admitted from 8/9-8/17 for sepsis from pneumonia complicated by renal failure, eventually required hemodialysis, since discharge was noncompliant with HD. -Volume overloaded on admission, with creatinine of 5.2, BUN 59, BNP 33, troponin 23, hemoglobin 7.1, chest x-ray noted bilateral interstitial infiltrates, right pleural effusion. -8/23 hemoglobin down to 6.3, transfuse 1 unit PRBC 8/26 HD with ultrafiltration, 3 L -8/28 underwent second stage basilic vein fistula transposition Dr. Lenell Antu -8/29 developed severe thrombophlebitis in left arm -8/30, antibiotics changed to vancomycin. Thrombophlebitis slowly improving  Assessment & Plan:   Principal Problem:   End-stage renal disease on hemodialysis (HCC) Active Problems:   Acute on chronic diastolic CHF (congestive heart failure) (HCC)   Essential hypertension   History of COPD   Hyperlipidemia   GAD (generalized anxiety disorder)   Pressure injury of skin  End-stage renal disease on hemodialysis (HCC) Volume overload, noncompliance, hyponatremia: -She had not done outpatient HD X 10 days prior to admission. -Volume status improved with HD -Vascular surgery consulted for AV fistula, second step completed 8/28 -Disposition complicated by homelessness- No phone or permanent address, unable to have HD transportation in this setting, social work following, TOC following.   Severe left arm thrombophlebitis, cellulitis -At site of previous IV, IV site changed. -Continue cold compresses, completed IV vancomycin for 5 days. -Encouraged Arm elevation.   Pleurisy, recent right-sided pneumonia: - Continue DuoNebs, incentive spirometry -Repeat  chest x-ray showed persistent densities in the right lower lobe most likely represent pleural fluid and consolidation.   Anemia of chronic renal disease: -sp one unit PRBC transfusion.  Iron deficiency anemia, with serum iron 7, TIBC 200, transferrin saturation of 4 and ferritin 187.  SP IV iron 8/26, 8/31 , added EPO. -Hemoglobin 7.7 on 11/22/22. -Monitor H/H daily   Acute on chronic diastolic CHF (congestive heart failure) [facility]): Echocardiogram with preserved LV systolic function with EF 55 to 60%, RV systolic function preserved, mild aortic valve stenosis, RVSP 43.1 mmHg.  -Volume management with HD.   Essential hypertension: -Improving with HD, continue amlodipine   History of COPD: Stable. Continue with bronchodilator therapy.    Hyperlipidemia: Continue with statin therapy.    GAD (generalized anxiety disorder): Continue with hydroxyzine and buspirone.    Homelessness   Pressure injury of skin: Present on admission.  Stage 2 pressure ulcer left buttock and coccyx.  Continue local skin care.    DVT prophylaxis: Heparin sq Code Status:  Full code Family Communication: No family at bed side Disposition Plan:    Status is: Inpatient Remains inpatient appropriate because: Homeless, needs placement.   Consultants:  Nephrology Vascular  Procedures: HD  Antimicrobials:  Anti-infectives (From admission, onward)    Start     Dose/Rate Route Frequency Ordered Stop   11/21/22 1200  vancomycin (VANCOREADY) IVPB 500 mg/100 mL  Status:  Discontinued        500 mg 100 mL/hr over 60 Minutes Intravenous Every T-Th-Sa (Hemodialysis) 11/19/22 0721 11/22/22 1226   11/19/22 1200  vancomycin (VANCOREADY) IVPB 750 mg/150 mL        750 mg 150 mL/hr over 60 Minutes Intravenous  Once 11/19/22 0721 11/19/22 1227   11/16/22 1200  vancomycin (VANCOREADY) IVPB 500 mg/100 mL  Status:  Discontinued        500 mg 100 mL/hr over 60 Minutes Intravenous Every T-Th-Sa (Hemodialysis)  11/15/22 0929 11/19/22 0721   11/15/22 1015  vancomycin (VANCOREADY) IVPB 1250 mg/250 mL        1,250 mg 166.7 mL/hr over 90 Minutes Intravenous  Once 11/15/22 0920 11/15/22 1314   11/14/22 1800  ceFAZolin (ANCEF) IVPB 1 g/50 mL premix  Status:  Discontinued       Note to Pharmacy: Adjust dose for ESRD   1 g 100 mL/hr over 30 Minutes Intravenous Every 24 hours 11/14/22 0854 11/15/22 0849   11/13/22 0656  ceFAZolin (ANCEF) 2-4 GM/100ML-% IVPB       Note to Pharmacy: Isabel Caprice, Destiny: cabinet override      11/13/22 0656 11/13/22 0818   11/13/22 0600  cefUROXime (ZINACEF) 1.5 g in sodium chloride 0.9 % 100 mL IVPB        1.5 g 200 mL/hr over 30 Minutes Intravenous On call to O.R. 11/12/22 1302 11/14/22 0559   11/13/22 0600  ceFAZolin (ANCEF) IVPB 2g/100 mL premix        2 g 200 mL/hr over 30 Minutes Intravenous To Short Stay 11/13/22 0458 11/13/22 0818       Subjective: Patient was seen and examined at bedside.  Overnight events noted. Patient still complains of having severe pain in the left arm and the right arm.   Objective: Vitals:   11/22/22 0355 11/22/22 0430 11/22/22 0857 11/22/22 0917  BP: 129/76 108/75 (!) 129/96   Pulse: 79 85 87 78  Resp: (!) 21 20 18 18   Temp: 98.5 F (36.9 C) 98 F (36.7 C) 98.8 F (37.1 C)   TempSrc: Oral Oral    SpO2: 95% 95% 97% 94%  Weight:      Height:        Intake/Output Summary (Last 24 hours) at 11/22/2022 1437 Last data filed at 11/22/2022 0800 Gross per 24 hour  Intake 1000.08 ml  Output 0 ml  Net 1000.08 ml   Filed Weights   11/18/22 0436 11/19/22 0414 11/21/22 0500  Weight: 51.4 kg 52.4 kg 53.1 kg    Examination:  General exam: Appears deconditioned, comfortable, not in any acute distress. Respiratory system: Decreased breath sounds. Respiratory effort normal.  RR 14 Cardiovascular system: S1 & S2 heard, RRR. No JVD, murmurs, rubs, gallops or clicks. Gastrointestinal system: Abdomen is nondistended, soft and nontender.   Normal bowel sounds heard. Central nervous system: Alert and oriented X 3. No focal neurological deficits. Extremities: Right arm AV fistula, surgical scars, left forearm with thrombophlebitis,  mild improvement Skin: No rashes, lesions or ulcers Psychiatry: Judgement and insight appear normal. Mood & affect appropriate.     Data Reviewed: I have personally reviewed following labs and imaging studies  CBC: Recent Labs  Lab 11/16/22 0900 11/17/22 0329 11/19/22 0411 11/22/22 0027 11/22/22 0735  WBC 6.0 5.9 6.1 6.4 5.2  HGB 7.3* 7.8* 7.3* 7.1* 7.7*  HCT 23.4* 24.8* 23.7* 23.6* 24.9*  MCV 101.7* 100.8* 102.2* 104.0* 102.9*  PLT 368 398 350 348 307   Basic Metabolic Panel: Recent Labs  Lab 11/16/22 0900 11/17/22 0329 11/19/22 1327 11/22/22 0027 11/22/22 0735  NA 132* 135 134* 134* 133*  K 3.9 4.4 4.4 5.5* 4.2  CL 99 97* 94* 98 95*  CO2 24 23 26 22 27   GLUCOSE 130* 89 92 94 83  BUN 40* 22 15 55* 20  CREATININE 3.41* 2.30* 1.47* 3.76* 1.95*  CALCIUM 8.9 9.0  8.6* 8.8* 8.4*  MG  --   --   --   --  1.6*  PHOS 3.6  --  2.2* 3.7 2.8   GFR: Estimated Creatinine Clearance: 25.1 mL/min (A) (by C-G formula based on SCr of 1.95 mg/dL (H)). Liver Function Tests: Recent Labs  Lab 11/16/22 0900 11/19/22 1327 11/22/22 0027 11/22/22 0735  AST  --   --   --  40  ALT  --   --   --  22  ALKPHOS  --   --   --  136*  BILITOT  --   --   --  0.5  PROT  --   --   --  6.2*  ALBUMIN 2.2* 2.4* 2.1* 2.1*   No results for input(s): "LIPASE", "AMYLASE" in the last 168 hours. No results for input(s): "AMMONIA" in the last 168 hours. Coagulation Profile: No results for input(s): "INR", "PROTIME" in the last 168 hours. Cardiac Enzymes: No results for input(s): "CKTOTAL", "CKMB", "CKMBINDEX", "TROPONINI" in the last 168 hours. BNP (last 3 results) No results for input(s): "PROBNP" in the last 8760 hours. HbA1C: No results for input(s): "HGBA1C" in the last 72 hours. CBG: No results for  input(s): "GLUCAP" in the last 168 hours. Lipid Profile: No results for input(s): "CHOL", "HDL", "LDLCALC", "TRIG", "CHOLHDL", "LDLDIRECT" in the last 72 hours. Thyroid Function Tests: No results for input(s): "TSH", "T4TOTAL", "FREET4", "T3FREE", "THYROIDAB" in the last 72 hours. Anemia Panel: No results for input(s): "VITAMINB12", "FOLATE", "FERRITIN", "TIBC", "IRON", "RETICCTPCT" in the last 72 hours. Sepsis Labs: No results for input(s): "PROCALCITON", "LATICACIDVEN" in the last 168 hours.  No results found for this or any previous visit (from the past 240 hour(s)).   Radiology Studies: DG CHEST PORT 1 VIEW  Result Date: 11/21/2022 CLINICAL DATA:  Cough.  History of end-stage renal disease. EXAM: PORTABLE CHEST 1 VIEW COMPARISON:  11/18/2022 FINDINGS: Again noted is a right jugular dialysis catheter. The catheter tip is in the SVC region. Persistent densities in the right lower chest most likely represent pleural fluid and consolidation. Right basilar chest densities may have slightly increased. Left lung is clear. Negative for a pneumothorax. Heart size is within normal limits and stable. Circular radiopaque structure in the right lower chest is probably external to the patient. IMPRESSION: Persistent densities in the right lower chest most likely represent pleural fluid and consolidation. Right basilar chest densities may have slightly increased. Electronically Signed   By: Richarda Overlie M.D.   On: 11/21/2022 10:42     Scheduled Meds:  (feeding supplement) PROSource Plus  30 mL Oral BID BM   atorvastatin  10 mg Oral Daily   busPIRone  10 mg Oral BID   calcium acetate  667 mg Oral TID WC   Chlorhexidine Gluconate Cloth  6 each Topical Q0600   Chlorhexidine Gluconate Cloth  6 each Topical Q0600   [START ON 11/23/2022] Chlorhexidine Gluconate Cloth  6 each Topical Q0600   darbepoetin (ARANESP) injection - DIALYSIS  150 mcg Subcutaneous Q Mon-1800   famotidine  20 mg Oral Daily   feeding  supplement (NEPRO CARB STEADY)  237 mL Oral TID BM   fluticasone furoate-vilanterol  1 puff Inhalation Daily   And   umeclidinium bromide  1 puff Inhalation Daily   gabapentin  100 mg Oral TID   heparin  5,000 Units Subcutaneous Q8H   influenza vac split trivalent PF  0.5 mL Intramuscular Tomorrow-1000   ipratropium-albuterol  3 mL Nebulization BID  leptospermum manuka honey  1 Application Topical Daily   multivitamin  1 tablet Oral QHS   nicotine  21 mg Transdermal Daily   pantoprazole  40 mg Oral Daily   senna-docusate  1 tablet Oral BID   Continuous Infusions:  sodium chloride Stopped (11/15/22 2018)     LOS: 12 days    Time spent: 35 mins    Willeen Niece, MD [facility]   If 7PM-7AM, please contact night-coverage

## 2022-11-22 NOTE — Progress Notes (Addendum)
Occupational Therapy Treatment Patient Details Name: [name] MRN: [id number] DOB: 06/12/1960 Today's Date: 11/22/2022   History of present illness Pt is a 62 y/o F presenting to ED on 8.24 with worsening LE edema, admitted for ESRD on HD. s/p R 2nd stage basilic vein fistula on 8/28. recent hospitalization 8/09-8/17 for sepsis 2/2 PNA complicated renal failure that required renal replacement therapy. PMH includes ESRD on HD, COPD, heart failure, HLD   OT comments  Pt reports ambulating in room already and wants to eat dinner, she was receptive and thankful for theraband exercises. Educated pt on UE/LE strengthening exercises (see below), she did struggle more with her LUE but advised pt to use less resistance. Min cueing required for correct body mechanics with 2/6 exercises. Pt verbalized compliance with daily HEP, OT to continue to progress pt as able. DC plans remain appropriate for no follow-up OT.       If plan is discharge home, recommend the following:  A little help with bathing/dressing/bathroom;Assistance with cooking/housework   Equipment Recommendations  None recommended by OT    Recommendations for Other Services      Precautions / Restrictions Precautions Precautions: Other (comment) Precaution Comments: RUE fistula and incision, LUE pain (BP in legs ONLY) Restrictions Weight Bearing Restrictions: No       Mobility Bed Mobility                    Transfers                         Balance                                           ADL either performed or assessed with clinical judgement   ADL                                         General ADL Comments: Focused session on HEP for strenghtening, pt reports she has already been up and moving today and just wanted to eat dinner. She was receptive to doing bed level exercises    Extremity/Trunk Assessment              Vision       Perception      Praxis      Cognition Arousal: Alert Behavior During Therapy: WFL for tasks assessed/performed Overall Cognitive Status: No family/caregiver present to determine baseline cognitive functioning                                          Exercises General Exercises - Upper Extremity Shoulder Flexion: AROM, Both, 10 reps, Theraband, Supine Theraband Level (Shoulder Flexion): Level 3 (Green) Shoulder Horizontal ABduction: AROM, Both, 10 reps, Theraband, Supine Theraband Level (Shoulder Horizontal Abduction): Level 3 (Green) Elbow Flexion: AROM, Both, 10 reps, Supine, Theraband, Strengthening Theraband Level (Elbow Flexion): Level 4 (Blue) Elbow Extension: AROM, Strengthening, Both, Supine, Theraband Theraband Level (Elbow Extension): Level 3 (Green) General Exercises - Lower Extremity Hip ABduction/ADduction: AROM, Both, 5 reps, Supine (with green theraband) Straight Leg Raises: AROM, Supine, 5 reps, Both (with green theraband)    Shoulder Instructions  General Comments      Pertinent Vitals/ Pain       Pain Assessment Pain Assessment: Faces Faces Pain Scale: Hurts a little bit Pain Location: BUEs Pain Descriptors / Indicators: Aching Pain Intervention(s): Monitored during session, Repositioned  Home Living                                          Prior Functioning/Environment              Frequency  Min 1X/week        Progress Toward Goals  OT Goals(current goals can now be found in the care plan section)  Progress towards OT goals: Progressing toward goals  Acute Rehab OT Goals Patient Stated Goal: To get her pain better OT Goal Formulation: With patient Time For Goal Achievement: 11/26/22 Potential to Achieve Goals: Good ADL Goals Pt/caregiver will Perform Home Exercise Program: Increased strength;Both right and left upper extremity;With theraband;With written HEP provided  Plan      Co-evaluation                  AM-PAC OT "6 Clicks" Daily Activity     Outcome Measure   Help from another person eating meals?: None Help from another person taking care of personal grooming?: A Little Help from another person toileting, which includes using toliet, bedpan, or urinal?: A Little Help from another person bathing (including washing, rinsing, drying)?: A Little Help from another person to put on and taking off regular upper body clothing?: A Little Help from another person to put on and taking off regular lower body clothing?: A Lot 6 Click Score: 18    End of Session    OT Visit Diagnosis: Unsteadiness on feet (R26.81)   Activity Tolerance Patient tolerated treatment well   Patient Left in bed;with call bell/phone within reach   Nurse Communication Mobility status        Time: 2952-8413 OT Time Calculation (min): 10 min  Charges: OT General Charges $OT Visit: 1 Visit OT Treatments $Therapeutic Exercise: 8-22 mins  11/22/2022  AB, OTR/L  Acute Rehabilitation Services  Office: [phone]   Tristan Schroeder 11/22/2022, 5:44 PM

## 2022-11-23 DIAGNOSIS — Z992 Dependence on renal dialysis: Secondary | ICD-10-CM | POA: Diagnosis not present

## 2022-11-23 DIAGNOSIS — N186 End stage renal disease: Secondary | ICD-10-CM | POA: Diagnosis not present

## 2022-11-23 LAB — PHOSPHORUS: Phosphorus: 4.7 mg/dL — ABNORMAL HIGH (ref 2.5–4.6)

## 2022-11-23 LAB — CBC
HCT: 26.1 % — ABNORMAL LOW (ref 36.0–46.0)
Hemoglobin: 8 g/dL — ABNORMAL LOW (ref 12.0–15.0)
MCH: 31.4 pg (ref 26.0–34.0)
MCHC: 30.7 g/dL (ref 30.0–36.0)
MCV: 102.4 fL — ABNORMAL HIGH (ref 80.0–100.0)
Platelets: 359 10*3/uL (ref 150–400)
RBC: 2.55 MIL/uL — ABNORMAL LOW (ref 3.87–5.11)
RDW: 19 % — ABNORMAL HIGH (ref 11.5–15.5)
WBC: 5.3 10*3/uL (ref 4.0–10.5)
nRBC: 0 % (ref 0.0–0.2)

## 2022-11-23 LAB — MAGNESIUM: Magnesium: 2.3 mg/dL (ref 1.7–2.4)

## 2022-11-23 NOTE — Progress Notes (Signed)
PROGRESS NOTE    [name]  UJW:[id number] DOB: [birth year] DOA: 11/09/2022 PCP: [name], MD   Brief Narrative:  This 62 yrs old Female with ESRD on hemodialysis, Chronic diastolic CHF, COPD, dyslipidemia presented to the ED with worsening lower extremity edema. -She was recently admitted from 8/9-8/17 for sepsis from pneumonia complicated by renal failure, eventually required hemodialysis, since discharge was noncompliant with HD. -Volume overloaded on admission, with creatinine of 5.2, BUN 59, BNP 33, troponin 23, hemoglobin 7.1, chest x-ray noted bilateral interstitial infiltrates, right pleural effusion. -8/23 hemoglobin down to 6.3, transfuse 1 unit PRBC 8/26 HD with ultrafiltration, 3 L -8/28 underwent second stage basilic vein fistula transposition Dr. Lenell Antu -8/29 developed severe thrombophlebitis in left arm -8/30, antibiotics changed to vancomycin. Thrombophlebitis slowly improving  Assessment & Plan:   Principal Problem:   End-stage renal disease on hemodialysis (HCC) Active Problems:   Acute on chronic diastolic CHF (congestive heart failure) (HCC)   Essential hypertension   History of COPD   Hyperlipidemia   GAD (generalized anxiety disorder)   Pressure injury of skin  End-stage renal disease on hemodialysis (HCC) Volume overload, noncompliance, hyponatremia: -She had not done outpatient HD X 10 days prior to admission. -Volume status improved with HD -Vascular surgery consulted for AV fistula, second step completed 8/28 -Disposition complicated by homelessness- No phone or permanent address, unable to have HD transportation in this setting, social work following, TOC following.   Severe left arm thrombophlebitis, cellulitis -At site of previous IV, IV site changed. -Continue cold compresses, completed IV vancomycin for 5 days. -Encouraged Arm elevation.   Pleurisy, recent right-sided pneumonia: - Continue DuoNebs, incentive spirometry -Repeat  chest x-ray showed persistent densities in the right lower lobe most likely represent pleural fluid and consolidation.   Anemia of chronic renal disease: -sp one unit PRBC transfusion.  Iron deficiency anemia, with serum iron 7, TIBC 200, transferrin saturation of 4 and ferritin 187.  SP IV iron 8/26, 8/31 , added EPO. -Hemoglobin 8.0 on 11/23/22. -Monitor H/H daily   Acute on chronic diastolic CHF (congestive heart failure) [facility]): Echocardiogram with preserved LV systolic function with EF 55 to 60%, RV systolic function preserved, mild aortic valve stenosis, RVSP 43.1 mmHg.  -Volume management with HD.   Essential hypertension: -Improving with HD, continue amlodipine.   History of COPD: Stable. Continue with bronchodilator therapy.    Hyperlipidemia: Continue with statin therapy.    GAD (generalized anxiety disorder): Continue with hydroxyzine and buspirone.    Homelessness:   Pressure injury of skin: Present on admission.  Stage 2 pressure ulcer left buttock and coccyx.  Continue local skin care.    DVT prophylaxis: Heparin sq Code Status:  Full code Family Communication: No family at bed side Disposition Plan:    Status is: Inpatient Remains inpatient appropriate because: Homeless, needs placement.   Consultants:  Nephrology Vascular  Procedures: HD  Antimicrobials:  Anti-infectives (From admission, onward)    Start     Dose/Rate Route Frequency Ordered Stop   11/21/22 1200  vancomycin (VANCOREADY) IVPB 500 mg/100 mL  Status:  Discontinued        500 mg 100 mL/hr over 60 Minutes Intravenous Every T-Th-Sa (Hemodialysis) 11/19/22 0721 11/22/22 1226   11/19/22 1200  vancomycin (VANCOREADY) IVPB 750 mg/150 mL        750 mg 150 mL/hr over 60 Minutes Intravenous  Once 11/19/22 0721 11/19/22 1227   11/16/22 1200  vancomycin (VANCOREADY) IVPB 500 mg/100 mL  Status:  Discontinued        500 mg 100 mL/hr over 60 Minutes Intravenous Every T-Th-Sa (Hemodialysis)  11/15/22 0929 11/19/22 0721   11/15/22 1015  vancomycin (VANCOREADY) IVPB 1250 mg/250 mL        1,250 mg 166.7 mL/hr over 90 Minutes Intravenous  Once 11/15/22 0920 11/15/22 1314   11/14/22 1800  ceFAZolin (ANCEF) IVPB 1 g/50 mL premix  Status:  Discontinued       Note to Pharmacy: Adjust dose for ESRD   1 g 100 mL/hr over 30 Minutes Intravenous Every 24 hours 11/14/22 0854 11/15/22 0849   11/13/22 0656  ceFAZolin (ANCEF) 2-4 GM/100ML-% IVPB       Note to Pharmacy: Isabel Caprice, Destiny: cabinet override      11/13/22 0656 11/13/22 0818   11/13/22 0600  cefUROXime (ZINACEF) 1.5 g in sodium chloride 0.9 % 100 mL IVPB        1.5 g 200 mL/hr over 30 Minutes Intravenous On call to O.R. 11/12/22 1302 11/14/22 0559   11/13/22 0600  ceFAZolin (ANCEF) IVPB 2g/100 mL premix        2 g 200 mL/hr over 30 Minutes Intravenous To Short Stay 11/13/22 0458 11/13/22 0818       Subjective: Patient was seen and examined at bedside.  Overnight events noted. Patient reports doing better,  She still reports having pain but swelling has improved.   Objective: Vitals:   11/22/22 2041 11/23/22 0423 11/23/22 0952 11/23/22 1000  BP: 130/66 122/71  118/70  Pulse: 86 77    Resp: 16 16 (P) 20   Temp: 97.6 F (36.4 C) 97.9 F (36.6 C)  (!) 97 F (36.1 C)  TempSrc: Oral Oral  Oral  SpO2: 97% 96%  98%  Weight:      Height:        Intake/Output Summary (Last 24 hours) at 11/23/2022 1259 Last data filed at 11/23/2022 0900 Gross per 24 hour  Intake 530 ml  Output 0 ml  Net 530 ml   Filed Weights   11/18/22 0436 11/19/22 0414 11/21/22 0500  Weight: 51.4 kg 52.4 kg 53.1 kg    Examination:  General exam: Appears deconditioned, but comfortable, not in any acute distress. Respiratory system: Decreased breath sounds. Respiratory effort normal.  RR 12 Cardiovascular system: S1 & S2 heard, RRR. No JVD, murmurs, rubs, gallops or clicks. Gastrointestinal system: Abdomen is nondistended, soft and nontender.  Normal  bowel sounds heard. Central nervous system: Alert and oriented X 3. No focal neurological deficits. Extremities: Right arm AV fistula, surgical scars, left forearm with thrombophlebitis,  mild improvement Skin: No rashes, lesions or ulcers Psychiatry: Judgement and insight appear normal. Mood & affect appropriate.     Data Reviewed: I have personally reviewed following labs and imaging studies  CBC: Recent Labs  Lab 11/17/22 0329 11/19/22 0411 11/22/22 0027 11/22/22 0735 11/23/22 0734  WBC 5.9 6.1 6.4 5.2 5.3  HGB 7.8* 7.3* 7.1* 7.7* 8.0*  HCT 24.8* 23.7* 23.6* 24.9* 26.1*  MCV 100.8* 102.2* 104.0* 102.9* 102.4*  PLT 398 350 348 307 359   Basic Metabolic Panel: Recent Labs  Lab 11/17/22 0329 11/19/22 1327 11/22/22 0027 11/22/22 0735 11/23/22 0734  NA 135 134* 134* 133*  --   K 4.4 4.4 5.5* 4.2  --   CL 97* 94* 98 95*  --   CO2 23 26 22 27   --   GLUCOSE 89 92 94 83  --   BUN 22 15 55* 20  --  CREATININE 2.30* 1.47* 3.76* 1.95*  --   CALCIUM 9.0 8.6* 8.8* 8.4*  --   MG  --   --   --  1.6* 2.3  PHOS  --  2.2* 3.7 2.8 4.7*   GFR: Estimated Creatinine Clearance: 25.1 mL/min (A) (by C-G formula based on SCr of 1.95 mg/dL (H)). Liver Function Tests: Recent Labs  Lab 11/19/22 1327 11/22/22 0027 11/22/22 0735  AST  --   --  40  ALT  --   --  22  ALKPHOS  --   --  136*  BILITOT  --   --  0.5  PROT  --   --  6.2*  ALBUMIN 2.4* 2.1* 2.1*   No results for input(s): "LIPASE", "AMYLASE" in the last 168 hours. No results for input(s): "AMMONIA" in the last 168 hours. Coagulation Profile: No results for input(s): "INR", "PROTIME" in the last 168 hours. Cardiac Enzymes: No results for input(s): "CKTOTAL", "CKMB", "CKMBINDEX", "TROPONINI" in the last 168 hours. BNP (last 3 results) No results for input(s): "PROBNP" in the last 8760 hours. HbA1C: No results for input(s): "HGBA1C" in the last 72 hours. CBG: No results for input(s): "GLUCAP" in the last 168  hours. Lipid Profile: No results for input(s): "CHOL", "HDL", "LDLCALC", "TRIG", "CHOLHDL", "LDLDIRECT" in the last 72 hours. Thyroid Function Tests: No results for input(s): "TSH", "T4TOTAL", "FREET4", "T3FREE", "THYROIDAB" in the last 72 hours. Anemia Panel: No results for input(s): "VITAMINB12", "FOLATE", "FERRITIN", "TIBC", "IRON", "RETICCTPCT" in the last 72 hours. Sepsis Labs: No results for input(s): "PROCALCITON", "LATICACIDVEN" in the last 168 hours.  No results found for this or any previous visit (from the past 240 hour(s)).   Radiology Studies: No results found.   Scheduled Meds:  (feeding supplement) PROSource Plus  30 mL Oral BID BM   atorvastatin  10 mg Oral Daily   busPIRone  10 mg Oral BID   calcium acetate  667 mg Oral TID WC   Chlorhexidine Gluconate Cloth  6 each Topical Q0600   darbepoetin (ARANESP) injection - DIALYSIS  150 mcg Subcutaneous Q Mon-1800   famotidine  20 mg Oral Daily   feeding supplement (NEPRO CARB STEADY)  237 mL Oral TID BM   fluticasone furoate-vilanterol  1 puff Inhalation Daily   And   umeclidinium bromide  1 puff Inhalation Daily   gabapentin  100 mg Oral TID   heparin  5,000 Units Subcutaneous Q8H   influenza vac split trivalent PF  0.5 mL Intramuscular Tomorrow-1000   ipratropium-albuterol  3 mL Nebulization BID   leptospermum manuka honey  1 Application Topical Daily   multivitamin  1 tablet Oral QHS   nicotine  21 mg Transdermal Daily   pantoprazole  40 mg Oral Daily   senna-docusate  1 tablet Oral BID   Continuous Infusions:  sodium chloride Stopped (11/15/22 2018)     LOS: 13 days    Time spent: 35 mins    Willeen Niece, MD [facility]   If 7PM-7AM, please contact night-coverage

## 2022-11-23 NOTE — Plan of Care (Signed)

## 2022-11-23 NOTE — Progress Notes (Signed)
[facility] Progress Note   Subjective:   pt seen in room.   Objective Vitals:   11/23/22 0423 11/23/22 0952 11/23/22 1000 11/23/22 1638  BP: 122/71  118/70 (!) 179/71  Pulse: 77   89  Resp: 16 (P) 20  18  Temp: 97.9 F (36.6 C)  (!) 97 F (36.1 C) 98.2 F (36.8 C)  TempSrc: Oral  Oral   SpO2: 96%  98% 100%  Weight:      Height:       Physical Exam General:chronically ill appearing female in NAD Heart:RRR, no mrg Lungs:CTAB, nml WOB on RA Abdomen:soft, NTND Extremities:no LE edema Dialysis Access: RIJ TDC, RU AVF new wounds intact, +b/t     [facility] ([facility] started [facility])  3h  52kg  400/1.5   2/2 bath  RIJ TDC  Heparin none - last inpt HD was 11/14/22 -> she never started at [facility] clinic - rocaltrol 0.5 mcg po three times per week - venofer 100mg  q hd 8/22- 9/07 - mircera 60 mcg IV q 2 wks, never started   Assessment/Plan: Pulm edema: In setting of not going to HD for > 1 week. Resolved with HD.  L arm thrombophlebitis - on Vancomycin. Per PMD.  ESRD: TTS schedule. HD today or tonight.  HD access: [facility] did 2nd stage BVT here 8/28. Still using TDC approx ~1 mo til healed.  HTN/volume: BP soft stable.  LE edema resolved. Close to dry wt. No change upon dc.  Anemia of ESRD: Hgb very low on admit. Given 1U PRBCs 8/25. Now 8. Continue Aranesp q Monday while here. Not tolerating iron infusions, have d/c'd. Secondary hyperparathyroidism: Calcium and phos in goal.  Continue binders and VDRA. Nutrition: Alb low, continue supplements. Renal diet w/fluid restrictions.  Homelessness: No permanent address or phone - arranging HD transportation is impossible for her at this moment, SW/CM involved. She is not interested in shelter. She is reaching out to a friend for possible room options. Pt was asking to return to [place] but [facility] unit was full and [place] was best 2nd choice for CLIP. Have explained to patient and she understands.    Dispo - stable for dc from renal standpoint; defer to primary team  Cynthia Moselle  MD  [facility] 11/23/2022, 5:55 PM  Recent Labs  Lab 11/22/22 0027 11/22/22 0735 11/23/22 0734  HGB 7.1* 7.7* 8.0*  ALBUMIN 2.1* 2.1*  --   CALCIUM 8.8* 8.4*  --   PHOS 3.7 2.8 4.7*  CREATININE 3.76* 1.95*  --   K 5.5* 4.2  --     Inpatient medications:  (feeding supplement) PROSource Plus  30 mL Oral BID BM   atorvastatin  10 mg Oral Daily   busPIRone  10 mg Oral BID   calcium acetate  667 mg Oral TID WC   Chlorhexidine Gluconate Cloth  6 each Topical Q0600   darbepoetin (ARANESP) injection - DIALYSIS  150 mcg Subcutaneous Q Mon-1800   famotidine  20 mg Oral Daily   feeding supplement (NEPRO CARB STEADY)  237 mL Oral TID BM   fluticasone furoate-vilanterol  1 puff Inhalation Daily   And   umeclidinium bromide  1 puff Inhalation Daily   gabapentin  100 mg Oral TID   heparin  5,000 Units Subcutaneous Q8H   influenza vac split trivalent PF  0.5 mL Intramuscular Tomorrow-1000   ipratropium-albuterol  3 mL Nebulization BID   leptospermum manuka honey  1 Application  Topical Daily   multivitamin  1 tablet Oral QHS   nicotine  21 mg Transdermal Daily   pantoprazole  40 mg Oral Daily   senna-docusate  1 tablet Oral BID    sodium chloride Stopped (11/15/22 2018)   sodium chloride, acetaminophen **OR** acetaminophen, albuterol, HYDROmorphone (DILAUDID) injection, hydrOXYzine, lidocaine, melatonin, ondansetron (ZOFRAN) IV, oxyCODONE-acetaminophen, traZODone

## 2022-11-24 DIAGNOSIS — N186 End stage renal disease: Secondary | ICD-10-CM | POA: Diagnosis not present

## 2022-11-24 DIAGNOSIS — Z992 Dependence on renal dialysis: Secondary | ICD-10-CM | POA: Diagnosis not present

## 2022-11-24 MED ORDER — HEPARIN SODIUM (PORCINE) 1000 UNIT/ML IJ SOLN
INTRAMUSCULAR | Status: AC
  Filled 2022-11-24: qty 4

## 2022-11-24 NOTE — Discharge Planning (Signed)
[facility] Patient Discharge Orders- [facility] CLINIC: [facility]  Patient's name: [name] Admit/DC Dates: 11/09/2022 - 11/24/2022  Discharge Diagnoses: Volume overload 2nd noncompliance with HD treatment. Noted she missed 10 treatments in outpatient. Also noted she is homeless and not interested in staying in a shelter. Looking to stay with a friend per notes  L arm cellulitis/thrombophlebitis, completed 5-day course IV Vanc R-sided PNA, ABX completed  Aranesp: Given: Yes    Date and amount of last dose: given 0n 11/18/22   Last Hgb: 8.0 PRBC's Given: Yes Date/# of units: 1 unit PRBCs on 11/10/22 ESA dose for discharge: mircera 200 mcg IV q 2 weeks   Heparin change: No  EDW Change: No. Per Dr. Arlean Hopping, keep EDW at [facility] Change: No  Access intervention/Change: S/p second stage basilic vein fistula transposition Dr. Lenell Antu on 11/13/22  Calcitriol change: No  Discharge Labs: Calcium 8.4 Phosphorus 4.7 Albumin 2.1 K+ 4.2  IV Antibiotics: No  On Coumadin?: No   OTHER/APPTS/LAB ORDERS:    D/C Meds to be reconciled by nurse after every discharge.  Completed By: Salome Holmes, NP   Reviewed by: MD:______ RN_______

## 2022-11-24 NOTE — Progress Notes (Signed)
DISCHARGE NOTE HOME [name] to be discharged Home per MD order. Discussed prescriptions and follow up appointments with the patient. Prescriptions given to patient; medication list explained in detail. Patient verbalized understanding.  Skin clean, dry and intact without evidence of skin break down, no evidence of skin tears noted. IV catheter discontinued intact. Site without signs and symptoms of complications. Dressing and pressure applied. Pt denies pain at the site currently. No complaints noted.  Patient free of lines, drains, and wounds.   An After Visit Summary (AVS) was printed and given to the patient. Patient escorted via wheelchair, and discharged home via private auto.  Velia Meyer, RN

## 2022-11-24 NOTE — Progress Notes (Signed)
Meds from pharmacy   home meds given to patient

## 2022-11-24 NOTE — Progress Notes (Signed)
[facility] Progress Note   Subjective:   pt seen in room.   Objective Vitals:   11/24/22 0044 11/24/22 0113 11/24/22 0149 11/24/22 0814  BP: (!) 152/79 (!) (P) 166/79 (!) 161/80   Pulse: 84 89 95   Resp: 17 19 18    Temp:  (P) 98.4 F (36.9 C) 98.4 F (36.9 C)   TempSrc:  (P) Oral Oral   SpO2: 93% 97% 99% 99%  Weight:      Height:       Physical Exam General:chronically ill appearing female in NAD Heart:RRR, no mrg Lungs:CTAB, nml WOB on RA Abdomen:soft, NTND Extremities:no LE edema Dialysis Access: RIJ TDC, RU AVF new wounds intact, +b/t     [facility] ([facility] started [facility])  3h  52kg  400/1.5   2/2 bath  RIJ TDC  Heparin none - last inpt HD was 11/14/22 -> she never started at [facility] - rocaltrol 0.5 mcg po three times per week - venofer 100mg  q hd 8/22- 9/07 - mircera 60 mcg IV q 2 wks, never started   Assessment/Plan: Pulm edema: In setting of not going to HD for > 1 week. Resolved with HD.  L arm thrombophlebitis - on Vancomycin. Per PMD.  ESRD: TTS schedule. HD today or tonight.  HD access: [facility] did 2nd stage BVT here 8/28. Still using TDC approx ~1 mo til healed.  HTN/volume: BP soft stable.  LE edema resolved. Close to dry wt. Would keep 52kg for dry wt upon discharge.  Anemia of ESRD: Hgb very low on admit. Given 1U PRBCs 8/25. Now 8. Continue Aranesp q Monday while here. Not tolerating iron infusions, have d/c'd. Secondary hyperparathyroidism: Calcium and phos in goal.  Continue binders and VDRA. Nutrition: Alb low, continue supplements. Renal diet w/fluid restrictions.  Homelessness: No permanent address or phone - arranging HD transportation is impossible for her at this moment, SW/CM involved. She is not interested in shelter. She is reaching out to a friend for possible room options. Pt was asking to return to [place] but [facility] unit was full and [place] was best 2nd choice for CLIP. Have explained to  patient and she understands.   Dispo - stable for dc from renal standpoint. For d/c today.   Vinson Moselle  MD  [facility] 11/24/2022, 3:18 PM  Recent Labs  Lab 11/22/22 0027 11/22/22 0735 11/23/22 0734  HGB 7.1* 7.7* 8.0*  ALBUMIN 2.1* 2.1*  --   CALCIUM 8.8* 8.4*  --   PHOS 3.7 2.8 4.7*  CREATININE 3.76* 1.95*  --   K 5.5* 4.2  --     Inpatient medications:  (feeding supplement) PROSource Plus  30 mL Oral BID BM   atorvastatin  10 mg Oral Daily   busPIRone  10 mg Oral BID   calcium acetate  667 mg Oral TID WC   Chlorhexidine Gluconate Cloth  6 each Topical Q0600   darbepoetin (ARANESP) injection - DIALYSIS  150 mcg Subcutaneous Q Mon-1800   famotidine  20 mg Oral Daily   feeding supplement (NEPRO CARB STEADY)  237 mL Oral TID BM   fluticasone furoate-vilanterol  1 puff Inhalation Daily   And   umeclidinium bromide  1 puff Inhalation Daily   gabapentin  100 mg Oral TID   heparin  5,000 Units Subcutaneous Q8H   ipratropium-albuterol  3 mL Nebulization BID   leptospermum manuka honey  1 Application Topical Daily   multivitamin  1 tablet  Oral QHS   nicotine  21 mg Transdermal Daily   pantoprazole  40 mg Oral Daily   senna-docusate  1 tablet Oral BID    sodium chloride Stopped (11/15/22 2018)   sodium chloride, acetaminophen **OR** acetaminophen, albuterol, HYDROmorphone (DILAUDID) injection, hydrOXYzine, lidocaine, melatonin, ondansetron (ZOFRAN) IV, oxyCODONE-acetaminophen, traZODone

## 2022-11-24 NOTE — Discharge Summary (Signed)
Physician Discharge Summary  [name] ZOX:[id number] DOB: [birth year] DOA: 11/09/2022  PCP: [name], MD  Admit date: 11/09/2022  Discharge date: 11/24/2022  Admitted From: Home  Disposition:  Home [facility])  Recommendations for Outpatient Follow-up:  Follow up with PCP in 1-2 weeks. Please obtain BMP/CBC in one week. Advised to follow-up with Nephrology for continuation of hemodialysis. Patient is being discharged to hotel.  she will continue to find placement with her friend.  Home Health:None Equipment/Devices: None  Discharge Condition: Stable CODE STATUS:Full code Diet recommendation: Heart Healthy   Brief [facility] Course: This 62 yrs old Female with ESRD on hemodialysis, Chronic diastolic CHF, COPD, dyslipidemia presented to the ED with worsening lower extremity edema. -She was recently admitted from 8/9-8/17 for sepsis from pneumonia complicated by renal failure, eventually required hemodialysis, since discharge was noncompliant with HD. -Volume overloaded on admission, with creatinine of 5.2, BUN 59, BNP 33, troponin 23, hemoglobin 7.1, chest x-ray noted bilateral interstitial infiltrates, right pleural effusion. -8/23 hemoglobin down to 6.3, transfuse 1 unit PRBC 8/26 HD with ultrafiltration, 3 L -8/28 underwent second stage basilic vein fistula transposition Dr. Lenell Antu -8/29 developed severe thrombophlebitis in left arm -8/30, antibiotics changed to vancomycin. Thrombophlebitis improved, completed vancomycin for 5 days. 9/8: Patient wants to be discharged, Nephrology signed off.  Discharge Diagnoses:  Principal Problem:   End-stage renal disease on hemodialysis (HCC) Active Problems:   Acute on chronic diastolic CHF (congestive heart failure) (HCC)   Essential hypertension   History of COPD   Hyperlipidemia   GAD (generalized anxiety disorder)   Pressure injury of skin  End-stage renal disease on hemodialysis (HCC) Volume overload,  noncompliance, hyponatremia: -She had not done outpatient HD X 10 days prior to admission. -Volume status improved with HD -Vascular surgery consulted for AV fistula, second step completed 8/28 -Disposition complicated by homelessness- No phone or permanent address, unable to have HD transportation in this setting, social work following,  -TOC following.   Severe left arm thrombophlebitis, cellulitis: -At site of previous IV, IV site changed. -Continue cold compresses, completed IV vancomycin for 5 days. -Encouraged Arm elevation.   Pleurisy, recent right-sided pneumonia: - Continue DuoNebs, incentive spirometry -Repeat chest x-ray showed persistent densities in the right lower lobe most likely represent pleural fluid and consolidation.   Anemia of chronic renal disease: -sp one unit PRBC transfusion.  Iron deficiency anemia, with serum iron 7, TIBC 200, transferrin saturation of 4 and ferritin 187.  SP IV iron 8/26, 8/31 , added EPO. -Hemoglobin 8.0 on 11/23/22. -Monitor H/H daily   Acute on chronic diastolic CHF (congestive heart failure) [facility]): Echocardiogram with preserved LV systolic function with EF 55 to 60%, RV systolic function preserved, mild aortic valve stenosis, RVSP 43.1 mmHg.  -Volume management with HD.   Essential hypertension: -Improving with HD, continue amlodipine.   History of COPD: Stable. Continue with bronchodilator therapy.    Hyperlipidemia: Continue with statin therapy.    GAD (generalized anxiety disorder): Continue with hydroxyzine and buspirone.    Homelessness: Patient states that she has place to live.    Pressure injury of skin: Present on admission.  Stage 2 pressure ulcer left buttock and coccyx.  Continue local skin care.  Discharge Instructions  Discharge Instructions     Call MD for:  difficulty breathing, headache or visual disturbances   Complete by: As directed    Call MD for:  persistant dizziness or light-headedness    Complete by: As directed  Call MD for:  persistant nausea and vomiting   Complete by: As directed    Diet - low sodium heart healthy   Complete by: As directed    Diet Carb Modified   Complete by: As directed    Discharge instructions   Complete by: As directed    Advised to follow-up with primary care physician in 1 week. Advised to follow-up with nephrology for continuation of hemodialysis. Patient is being discharged to hotel.  she will continue to find placement with her friend.   Discharge wound care:   Complete by: As directed    Follow-up with PCP in 1 week.   Increase activity slowly   Complete by: As directed       Allergies as of 11/24/2022       Reactions   Zestril [lisinopril] Other (See Comments)   Hyperkalemia        Medication List     TAKE these medications    acetaminophen 500 MG tablet Commonly known as: TYLENOL Take 1,000 mg by mouth 2 (two) times daily as needed for moderate pain, fever or headache.   atorvastatin 10 MG tablet Commonly known as: LIPITOR Take 1 tablet (10 mg total) by mouth daily.   busPIRone 10 MG tablet Commonly known as: BUSPAR Take 1 tablet (10 mg total) by mouth 2 (two) times daily.   FeroSul 325 (65 Fe) MG tablet Generic drug: ferrous sulfate Take 1 tablet (325 mg total) by mouth daily with breakfast.   furosemide 80 MG tablet Commonly known as: Lasix Take 1 tablet (80 mg total) by mouth every Monday, Wednesday, and Friday.   gabapentin 100 MG capsule Commonly known as: NEURONTIN Take 1 capsule (100 mg total) by mouth 3 (three) times daily.   hydrOXYzine 25 MG tablet Commonly known as: ATARAX Take 1 tablet (25 mg total) by mouth 3 (three) times daily as needed for anxiety or itching.   ipratropium-albuterol 0.5-2.5 (3) MG/3ML Soln Commonly known as: DUONEB Take 3 mLs by nebulization every 6 (six) hours as needed (shortness of breath and wheezing). What changed: reasons to take this   multivitamin Tabs  tablet Take 1 tablet by mouth at bedtime.   oxyCODONE-acetaminophen 5-325 MG tablet Commonly known as: PERCOCET/ROXICET Take 1 tablet by mouth every 8 (eight) hours as needed for severe pain or moderate pain.   pantoprazole 40 MG tablet Commonly known as: PROTONIX Take 1 tablet (40 mg total) by mouth 2 (two) times daily for 30 days, THEN 1 tablet (40 mg total) daily. Start taking on: November 02, 2022   sodium bicarbonate 650 MG tablet Take 1 tablet (650 mg total) by mouth 3 (three) times daily.   traZODone 50 MG tablet Commonly known as: DESYREL Take 1 tablet (50 mg total) by mouth at bedtime as needed for sleep.   Trelegy Ellipta 100-62.5-25 MCG/ACT Aepb Generic drug: Fluticasone-Umeclidin-Vilant Inhale 1 puff into the lungs daily.   Ventolin HFA 108 (90 Base) MCG/ACT inhaler Generic drug: albuterol Inhale 2 puffs into the lungs every 6 (six) hours as needed for wheezing or shortness of breath.               Discharge Care Instructions  (From admission, onward)           Start     Ordered   11/24/22 0000  Discharge wound care:       Comments: Follow-up with PCP in 1 week.   11/24/22 1143  Follow-up Information     [facility] at [facility] Follow up in 3 week(s).   Specialty: Vascular Surgery Why: Office will call you to arrange your appt (sent). Contact information: [address] [place] [address] [phone]        [name], MD Follow up.   Specialty: Internal Medicine Contact information: [address] [place] [address] [phone]         Ginette Otto, [facility] Go on 11/26/2022.   Why: Schedule is Tuesday,Thursday, Saturday with 10:40 chair time.  On Tuesday (9/10), please arrive at 9:30 am to complete paperwork prior to treatment. Contact information: [address] [place] [address] [phone]         [name], MD .   Specialty: Internal Medicine Contact information: [address] [place] [address] [place] [address] [phone]                Allergies  Allergen Reactions   Zestril [Lisinopril] Other (See Comments)    Hyperkalemia    Consultations: Nephrology   Procedures/Studies: DG CHEST PORT 1 VIEW  Result Date: 11/21/2022 CLINICAL DATA:  Cough.  History of end-stage renal disease. EXAM: PORTABLE CHEST 1 VIEW COMPARISON:  11/18/2022 FINDINGS: Again noted is a right jugular dialysis catheter. The catheter tip is in the SVC region. Persistent densities in the right lower chest most likely represent pleural fluid and consolidation. Right basilar chest densities may have slightly increased. Left lung is clear. Negative for a pneumothorax. Heart size is within normal limits and stable. Circular radiopaque structure in the right lower chest is probably external to the patient. IMPRESSION: Persistent densities in the right lower chest most likely represent pleural fluid and consolidation. Right basilar chest densities may have slightly increased. Electronically Signed   By: Richarda Overlie M.D.   On: 11/21/2022 10:42   DG CHEST PORT 1 VIEW  Result Date: 11/18/2022 CLINICAL DATA:  Cough. EXAM: PORTABLE CHEST 1 VIEW COMPARISON:  Radiograph 11/09/2022.  Chest CT 11/15/2021 FINDINGS: Right-sided dialysis catheter remains in place. Progressive volume loss at the right lung base with increasing opacity, may be secondary to increasing pleural effusion or mucous plugging/collapse. Background peribronchial thickening. Trace left pleural effusion. No pneumothorax. IMPRESSION: 1. Progressive volume loss at the right lung base with increasing opacity, may be secondary to increasing pleural effusion or mucous plugging/collapse. 2. Trace left pleural effusion. Electronically Signed   By: Narda Rutherford M.D.   On: 11/18/2022 15:51   [place] LT UPPER EXTREM LTD SOFT TISSUE NON VASCULAR  Result Date:  11/14/2022 CLINICAL DATA:  IV site extravasation. EXAM: ULTRASOUND LEFT UPPER EXTREMITY LIMITED TECHNIQUE: Ultrasound examination of the upper extremity soft tissues was performed in the area of clinical concern. COMPARISON:  None Available. FINDINGS: Focused ultrasound of the left forearm demonstrates prominent subcutaneous edema and hypervascularity without focal fluid collection or mass. A superficial vein in this area appears thrombosed. IMPRESSION: 1. Findings suggestive of left forearm superficial thrombophlebitis. Electronically Signed   By: Obie Dredge M.D.   On: 11/14/2022 11:41   DG Chest 2 View  Result Date: 11/09/2022 CLINICAL DATA:  Cough and shortness of breath. EXAM: CHEST - 2 VIEW COMPARISON:  10/30/2022 FINDINGS: Right-sided dialysis catheter in place. Increasing right lung base opacity and volume loss, atelectasis/airspace disease and possible effusion. Stable upper normal heart size. Vascular congestion. No pneumothorax. Improved aeration at the left lung base. IMPRESSION: 1. Increasing right lung  base opacity and volume loss, likely progression of atelectasis/airspace disease and possible effusion. 2. Improved aeration at the left lung base. 3. Vascular congestion. Electronically Signed   By: Narda Rutherford M.D.   On: 11/09/2022 22:57   DG Chest Port 1 View  Result Date: 10/30/2022 CLINICAL DATA:  Chest pain EXAM: PORTABLE CHEST 1 VIEW COMPARISON:  10/25/2022 FINDINGS: The patient is rotated to the right on today's radiograph, reducing diagnostic sensitivity and specificity. Dual lumen right IJ catheter tip: SVC. Indistinct right hemidiaphragm compatible with consolidation. Confluent bandlike opacities at the left lung base. Mild indistinctness of the pulmonary vasculature. Reverse lordotic projection. Heart size believed to be within normal limits. IMPRESSION: 1. Right lower lobe airspace opacity suspicious for pneumonia. 2. Confluent bandlike opacities at the left lung base,  potentially from atelectasis or pneumonia. 3. Mild indistinctness of the pulmonary vasculature, potentially from pulmonary venous congestion. Electronically Signed   By: Gaylyn Rong M.D.   On: 10/30/2022 18:44   CT ABDOMEN PELVIS WO CONTRAST  Result Date: 10/25/2022 CLINICAL DATA:  Shortness of breath and chest pain EXAM: CT ABDOMEN AND PELVIS WITHOUT CONTRAST TECHNIQUE: Multidetector CT imaging of the abdomen and pelvis was performed following the standard protocol without IV contrast. RADIATION DOSE REDUCTION: This exam was performed according to the departmental dose-optimization program which includes automated exposure control, adjustment of the mA and/or kV according to patient size and/or use of iterative reconstruction technique. COMPARISON:  CT abdomen and pelvis dated 12/31/2018 FINDINGS: Lower chest: Left lower lobe consolidation with air bronchograms. No pleural effusion or pneumothorax demonstrated. Partially imaged heart size is normal. Coronary artery calcifications. Hepatobiliary: Subcentimeter segment 3 hypodensities, too small to characterize, but unchanged from 12/31/2018. No intra or extrahepatic biliary ductal dilation. Normal gallbladder. Pancreas: No focal lesions or main ductal dilation. Spleen: Normal in size without focal abnormality. Adrenals/Urinary Tract: No adrenal nodules. 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 No suspicious renal mass, calculi or hydronephrosis. Gas within the urinary bladder, which demonstrates mild mural thickening and pericystic stranding. Stomach/Bowel: Normal appearance of the stomach. No evidence of bowel wall thickening, distention, or inflammatory changes. Normal appendix. Vascular/Lymphatic: Aortic atherosclerosis. No enlarged abdominal or pelvic lymph nodes. Reproductive: No adnexal masses. Other: Trace free fluid.  No fluid collection or free air. Musculoskeletal: No acute or abnormal lytic or blastic osseous lesions. Multilevel  degenerative changes of the partially imaged thoracic and lumbar spine. IMPRESSION: 1. Left lower lobe consolidation with air bronchograms, suspicious for pneumonia. 2. Gas within the urinary bladder, which demonstrates mild mural thickening and pericystic stranding, suspicious for cystitis. Recommend correlation with urinalysis. 3. Aortic Atherosclerosis (ICD10-I70.0). Coronary artery calcifications. Assessment for potential risk factor modification, dietary therapy or pharmacologic therapy may be warranted, if clinically indicated. Electronically Signed   By: Agustin Cree M.D.   On: 10/25/2022 16:22      Subjective: Patient seen and examined at bed side, states she feels better and wants to be discharged.    Discharge Exam: Vitals:   11/24/22 0149 11/24/22 0814  BP: (!) 161/80   Pulse: 95   Resp: 18   Temp: 98.4 F (36.9 C)   SpO2: 99% 99%   Vitals:   11/24/22 0044 11/24/22 0113 11/24/22 0149 11/24/22 0814  BP: (!) 152/79 (!) (P) 166/79 (!) 161/80   Pulse: 84 89 95   Resp: 17 19 18    Temp:  (P) 98.4 F (36.9 C) 98.4 F (36.9 C)   TempSrc:  (P) Oral Oral   SpO2:  93% 97% 99% 99%  Weight:      Height:        General: Pt is alert, awake, not in acute distress Cardiovascular: RRR, S1/S2 +, no rubs, no gallops Respiratory: CTA bilaterally, no wheezing, no rhonchi Abdominal: Soft, NT, ND, bowel sounds + Extremities: no edema, no cyanosis    The results of significant diagnostics from this hospitalization (including imaging, microbiology, ancillary and laboratory) are listed below for reference.     Microbiology: No results found for this or any previous visit (from the past 240 hour(s)).   Labs: BNP (last 3 results) Recent Labs    08/12/22 1710 11/09/22 2126  BNP 1,084.9* 383.3*   Basic Metabolic Panel: Recent Labs  Lab 11/19/22 1327 11/22/22 0027 11/22/22 0735 11/23/22 0734  NA 134* 134* 133*  --   K 4.4 5.5* 4.2  --   CL 94* 98 95*  --   CO2 26 22 27   --    GLUCOSE 92 94 83  --   BUN 15 55* 20  --   CREATININE 1.47* 3.76* 1.95*  --   CALCIUM 8.6* 8.8* 8.4*  --   MG  --   --  1.6* 2.3  PHOS 2.2* 3.7 2.8 4.7*   Liver Function Tests: Recent Labs  Lab 11/19/22 1327 11/22/22 0027 11/22/22 0735  AST  --   --  40  ALT  --   --  22  ALKPHOS  --   --  136*  BILITOT  --   --  0.5  PROT  --   --  6.2*  ALBUMIN 2.4* 2.1* 2.1*   No results for input(s): "LIPASE", "AMYLASE" in the last 168 hours. No results for input(s): "AMMONIA" in the last 168 hours. CBC: Recent Labs  Lab 11/19/22 0411 11/22/22 0027 11/22/22 0735 11/23/22 0734  WBC 6.1 6.4 5.2 5.3  HGB 7.3* 7.1* 7.7* 8.0*  HCT 23.7* 23.6* 24.9* 26.1*  MCV 102.2* 104.0* 102.9* 102.4*  PLT 350 348 307 359   Cardiac Enzymes: No results for input(s): "CKTOTAL", "CKMB", "CKMBINDEX", "TROPONINI" in the last 168 hours. BNP: Invalid input(s): "POCBNP" CBG: No results for input(s): "GLUCAP" in the last 168 hours. D-Dimer No results for input(s): "DDIMER" in the last 72 hours. Hgb A1c No results for input(s): "HGBA1C" in the last 72 hours. Lipid Profile No results for input(s): "CHOL", "HDL", "LDLCALC", "TRIG", "CHOLHDL", "LDLDIRECT" in the last 72 hours. Thyroid function studies No results for input(s): "TSH", "T4TOTAL", "T3FREE", "THYROIDAB" in the last 72 hours.  Invalid input(s): "FREET3" Anemia work up No results for input(s): "VITAMINB12", "FOLATE", "FERRITIN", "TIBC", "IRON", "RETICCTPCT" in the last 72 hours. Urinalysis    Component Value Date/Time   COLORURINE YELLOW 10/25/2022 2015   APPEARANCEUR HAZY (A) 10/25/2022 2015   LABSPEC 1.009 10/25/2022 2015   PHURINE 5.0 10/25/2022 2015   GLUCOSEU 50 (A) 10/25/2022 2015   HGBUR LARGE (A) 10/25/2022 2015   BILIRUBINUR NEGATIVE 10/25/2022 2015   KETONESUR NEGATIVE 10/25/2022 2015   PROTEINUR 100 (A) 10/25/2022 2015   UROBILINOGEN 0.2 03/02/2010 2027   NITRITE POSITIVE (A) 10/25/2022 2015   LEUKOCYTESUR MODERATE (A)  10/25/2022 2015   Sepsis Labs Recent Labs  Lab 11/19/22 0411 11/22/22 0027 11/22/22 0735 11/23/22 0734  WBC 6.1 6.4 5.2 5.3   Microbiology No results found for this or any previous visit (from the past 240 hour(s)).   Time coordinating discharge: Over 30 minutes  SIGNED:   Willeen Niece, MD  [facility] 11/24/2022, 2:33  PM Pager   If 7PM-7AM, please contact night-coverage

## 2022-11-25 ENCOUNTER — Telehealth: Payer: Self-pay

## 2022-11-25 NOTE — Progress Notes (Signed)
Late Note Entry- November 25, 2022  Pt was d/c yesterday. Contacted [facility] this morning to advise clinic of pt's d/c date and that pt should start tomorrow.   Olivia Canter Renal Navigator [phone]

## 2022-11-25 NOTE — Transitions of Care (Post Inpatient/ED Visit) (Signed)
   11/25/2022  Name: [name] MRN: [id number] DOB: Jul 25, 1960  Today's TOC FU Call Status: Today's TOC FU Call Status:: Unsuccessful Call (1st Attempt) Unsuccessful Call (1st Attempt) Date: 11/25/22  Attempted to reach the patient regarding the most recent Inpatient/ED visit.  Follow Up Plan: Additional outreach attempts will be made to reach the patient to complete the Transitions of Care (Post Inpatient/ED visit) call.   Signature  Robyne Peers, RN

## 2022-11-26 ENCOUNTER — Telehealth: Payer: Self-pay

## 2022-11-26 NOTE — Transitions of Care (Post Inpatient/ED Visit) (Signed)
   11/26/2022  Name: [name] MRN: [id number] DOB: 1960-12-17  Today's TOC FU Call Status: Today's TOC FU Call Status:: Unsuccessful Call (2nd Attempt) Unsuccessful Call (1st Attempt) Date: 11/25/22 Unsuccessful Call (2nd Attempt) Date: 11/26/22  Attempted to reach the patient regarding the most recent Inpatient/ED visit.  Follow Up Plan: Additional outreach attempts will be made to reach the patient to complete the Transitions of Care (Post Inpatient/ED visit) call.   Signature  Robyne Peers, RN

## 2022-11-27 ENCOUNTER — Telehealth: Payer: Self-pay

## 2022-11-27 NOTE — Transitions of Care (Post Inpatient/ED Visit) (Signed)
   11/27/2022  Name: [name] MRN: [id number] DOB: [birth year]  Today's TOC FU Call Status: Today's TOC FU Call Status:: Unsuccessful Call (3rd Attempt) Unsuccessful Call (1st Attempt) Date: 11/25/22 Unsuccessful Call (2nd Attempt) Date: 11/26/22 Unsuccessful Call (3rd Attempt) Date: 11/27/22  Attempted to reach the patient regarding the most recent Inpatient/ED visit.  Follow Up Plan: No further outreach attempts will be made at this time. We have been unable to contact the patient.  The patient has a follow up appointment with Dr Laural Benes at [facility] on 11/29/2022.  Signature Robyne Peers, RN

## 2022-11-29 ENCOUNTER — Inpatient Hospital Stay: Payer: Self-pay | Admitting: Internal Medicine

## 2022-11-29 ENCOUNTER — Ambulatory Visit: Payer: Medicaid Other | Admitting: Gastroenterology

## 2022-11-29 ENCOUNTER — Other Ambulatory Visit: Payer: Self-pay
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
## 2023-01-13 ENCOUNTER — Other Ambulatory Visit (HOSPITAL_COMMUNITY): Payer: Self-pay

## 2023-01-13 ENCOUNTER — Other Ambulatory Visit: Payer: Self-pay

## 2023-01-14 ENCOUNTER — Other Ambulatory Visit: Payer: Self-pay

## 2023-01-14 ENCOUNTER — Other Ambulatory Visit (HOSPITAL_COMMUNITY): Payer: Self-pay

## 2023-01-15 ENCOUNTER — Other Ambulatory Visit (HOSPITAL_COMMUNITY): Payer: Self-pay

## 2023-01-15 ENCOUNTER — Telehealth (HOSPITAL_COMMUNITY): Payer: Self-pay | Admitting: Pharmacy Technician

## 2023-01-15 NOTE — Telephone Encounter (Signed)
Pharmacy Patient Advocate Encounter  Received notification from [facility] that Prior Authorization for Trelegy Ellipta 100-62.5-25MCG/ACT aerosol powder  has been APPROVED from 01/15/2023 to 01/15/2024. Ran test claim, Copay is $4.00. This test claim was processed through [facility]- copay amounts may vary at other pharmacies due to pharmacy/plan contracts, or as the patient moves through the different stages of their insurance plan.   PA #/Case ID/Reference #: [id number]

## 2023-01-15 NOTE — Telephone Encounter (Signed)
Pharmacy Patient Advocate Encounter   Received notification that prior authorization for Trelegy Ellipta 100-62.5-25MCG/ACT aerosol powder is required/requested.   Insurance verification completed.   The patient is insured through [facility] .   Per test claim: PA required; PA submitted to above mentioned insurance via CoverMyMeds Key/confirmation #/EOC [id number] Status is pending

## 2023-01-23 ENCOUNTER — Emergency Department (HOSPITAL_COMMUNITY): Payer: Medicare Other

## 2023-01-23 ENCOUNTER — Other Ambulatory Visit: Payer: Self-pay

## 2023-01-23 ENCOUNTER — Inpatient Hospital Stay (HOSPITAL_COMMUNITY)
Admission: EM | Admit: 2023-01-23 | Discharge: 2023-02-01 | DRG: 291 | Disposition: A | Discharge status: Home / Self Care | Payer: Medicare Other | Attending: Internal Medicine | Admitting: Internal Medicine

## 2023-01-23 DIAGNOSIS — Z91158 Patient's noncompliance with renal dialysis for other reason: Secondary | ICD-10-CM

## 2023-01-23 DIAGNOSIS — E876 Hypokalemia: Secondary | ICD-10-CM | POA: Diagnosis present

## 2023-01-23 DIAGNOSIS — Z8709 Personal history of other diseases of the respiratory system: Secondary | ICD-10-CM

## 2023-01-23 DIAGNOSIS — F419 Anxiety disorder, unspecified: Secondary | ICD-10-CM

## 2023-01-23 DIAGNOSIS — Z992 Dependence on renal dialysis: Secondary | ICD-10-CM

## 2023-01-23 DIAGNOSIS — Z79899 Other long term (current) drug therapy: Secondary | ICD-10-CM

## 2023-01-23 DIAGNOSIS — R0602 Shortness of breath: Secondary | ICD-10-CM | POA: Diagnosis not present

## 2023-01-23 DIAGNOSIS — Q2112 Patent foramen ovale: Secondary | ICD-10-CM

## 2023-01-23 DIAGNOSIS — F411 Generalized anxiety disorder: Secondary | ICD-10-CM | POA: Diagnosis present

## 2023-01-23 DIAGNOSIS — F1010 Alcohol abuse, uncomplicated: Secondary | ICD-10-CM | POA: Diagnosis present

## 2023-01-23 DIAGNOSIS — G8929 Other chronic pain: Secondary | ICD-10-CM

## 2023-01-23 DIAGNOSIS — K219 Gastro-esophageal reflux disease without esophagitis: Secondary | ICD-10-CM | POA: Diagnosis present

## 2023-01-23 DIAGNOSIS — Z1152 Encounter for screening for COVID-19: Secondary | ICD-10-CM

## 2023-01-23 DIAGNOSIS — F17210 Nicotine dependence, cigarettes, uncomplicated: Secondary | ICD-10-CM | POA: Diagnosis present

## 2023-01-23 DIAGNOSIS — J810 Acute pulmonary edema: Principal | ICD-10-CM

## 2023-01-23 DIAGNOSIS — I132 Hypertensive heart and chronic kidney disease with heart failure and with stage 5 chronic kidney disease, or end stage renal disease: Secondary | ICD-10-CM | POA: Diagnosis not present

## 2023-01-23 DIAGNOSIS — N2581 Secondary hyperparathyroidism of renal origin: Secondary | ICD-10-CM | POA: Diagnosis present

## 2023-01-23 DIAGNOSIS — E785 Hyperlipidemia, unspecified: Secondary | ICD-10-CM | POA: Diagnosis present

## 2023-01-23 DIAGNOSIS — Z833 Family history of diabetes mellitus: Secondary | ICD-10-CM

## 2023-01-23 DIAGNOSIS — Z5902 Unsheltered homelessness: Secondary | ICD-10-CM

## 2023-01-23 DIAGNOSIS — Z5982 Transportation insecurity: Secondary | ICD-10-CM

## 2023-01-23 DIAGNOSIS — N186 End stage renal disease: Secondary | ICD-10-CM | POA: Diagnosis present

## 2023-01-23 DIAGNOSIS — E875 Hyperkalemia: Secondary | ICD-10-CM | POA: Diagnosis present

## 2023-01-23 DIAGNOSIS — F32A Depression, unspecified: Secondary | ICD-10-CM | POA: Diagnosis present

## 2023-01-23 DIAGNOSIS — R636 Underweight: Secondary | ICD-10-CM | POA: Diagnosis present

## 2023-01-23 DIAGNOSIS — E1122 Type 2 diabetes mellitus with diabetic chronic kidney disease: Secondary | ICD-10-CM | POA: Diagnosis present

## 2023-01-23 DIAGNOSIS — Z5941 Food insecurity: Secondary | ICD-10-CM

## 2023-01-23 DIAGNOSIS — I5032 Chronic diastolic (congestive) heart failure: Secondary | ICD-10-CM | POA: Diagnosis present

## 2023-01-23 DIAGNOSIS — J4489 Other specified chronic obstructive pulmonary disease: Secondary | ICD-10-CM | POA: Diagnosis present

## 2023-01-23 DIAGNOSIS — D649 Anemia, unspecified: Secondary | ICD-10-CM | POA: Diagnosis present

## 2023-01-23 DIAGNOSIS — Z888 Allergy status to other drugs, medicaments and biological substances status: Secondary | ICD-10-CM

## 2023-01-23 DIAGNOSIS — I509 Heart failure, unspecified: Secondary | ICD-10-CM

## 2023-01-23 DIAGNOSIS — I5033 Acute on chronic diastolic (congestive) heart failure: Secondary | ICD-10-CM | POA: Diagnosis present

## 2023-01-23 DIAGNOSIS — I10 Essential (primary) hypertension: Secondary | ICD-10-CM | POA: Diagnosis present

## 2023-01-23 DIAGNOSIS — Z0389 Encounter for observation for other suspected diseases and conditions ruled out: Secondary | ICD-10-CM

## 2023-01-23 DIAGNOSIS — T8241XA Breakdown (mechanical) of vascular dialysis catheter, initial encounter: Secondary | ICD-10-CM | POA: Diagnosis present

## 2023-01-23 DIAGNOSIS — Z8719 Personal history of other diseases of the digestive system: Secondary | ICD-10-CM

## 2023-01-23 DIAGNOSIS — G4700 Insomnia, unspecified: Secondary | ICD-10-CM | POA: Diagnosis present

## 2023-01-23 DIAGNOSIS — D631 Anemia in chronic kidney disease: Secondary | ICD-10-CM | POA: Diagnosis present

## 2023-01-23 DIAGNOSIS — J449 Chronic obstructive pulmonary disease, unspecified: Secondary | ICD-10-CM

## 2023-01-23 DIAGNOSIS — F1994 Other psychoactive substance use, unspecified with psychoactive substance-induced mood disorder: Secondary | ICD-10-CM

## 2023-01-23 DIAGNOSIS — E8770 Fluid overload, unspecified: Secondary | ICD-10-CM | POA: Diagnosis present

## 2023-01-23 DIAGNOSIS — Y712 Prosthetic and other implants, materials and accessory cardiovascular devices associated with adverse incidents: Secondary | ICD-10-CM | POA: Diagnosis present

## 2023-01-23 DIAGNOSIS — Z91199 Patient's noncompliance with other medical treatment and regimen due to unspecified reason: Secondary | ICD-10-CM

## 2023-01-23 DIAGNOSIS — Z681 Body mass index (BMI) 19 or less, adult: Secondary | ICD-10-CM

## 2023-01-23 LAB — I-STAT CHEM 8, ED
BUN: 82 mg/dL — ABNORMAL HIGH (ref 8–23)
Calcium, Ion: 1.11 mmol/L — ABNORMAL LOW (ref 1.15–1.40)
Chloride: 113 mmol/L — ABNORMAL HIGH (ref 98–111)
Creatinine, Ser: 6.3 mg/dL — ABNORMAL HIGH (ref 0.44–1.00)
Glucose, Bld: 98 mg/dL (ref 70–99)
HCT: 16 % — ABNORMAL LOW (ref 36.0–46.0)
Hemoglobin: 5.4 g/dL — CL (ref 12.0–15.0)
Potassium: 3.8 mmol/L (ref 3.5–5.1)
Sodium: 137 mmol/L (ref 135–145)
TCO2: 14 mmol/L — ABNORMAL LOW (ref 22–32)

## 2023-01-23 LAB — CBC WITH DIFFERENTIAL/PLATELET
Abs Immature Granulocytes: 0.07 10*3/uL (ref 0.00–0.07)
Basophils Absolute: 0 10*3/uL (ref 0.0–0.1)
Basophils Relative: 1 %
Eosinophils Absolute: 0.1 10*3/uL (ref 0.0–0.5)
Eosinophils Relative: 2 %
HCT: 15.6 % — ABNORMAL LOW (ref 36.0–46.0)
Hemoglobin: 4.9 g/dL — CL (ref 12.0–15.0)
Immature Granulocytes: 1 %
Lymphocytes Relative: 33 %
Lymphs Abs: 1.8 10*3/uL (ref 0.7–4.0)
MCH: 32.9 pg (ref 26.0–34.0)
MCHC: 31.4 g/dL (ref 30.0–36.0)
MCV: 104.7 fL — ABNORMAL HIGH (ref 80.0–100.0)
Monocytes Absolute: 0.7 10*3/uL (ref 0.1–1.0)
Monocytes Relative: 12 %
Neutro Abs: 2.9 10*3/uL (ref 1.7–7.7)
Neutrophils Relative %: 51 %
Platelets: 382 10*3/uL (ref 150–400)
RBC: 1.49 MIL/uL — ABNORMAL LOW (ref 3.87–5.11)
RDW: 18.1 % — ABNORMAL HIGH (ref 11.5–15.5)
WBC: 5.6 10*3/uL (ref 4.0–10.5)
nRBC: 1.6 % — ABNORMAL HIGH (ref 0.0–0.2)

## 2023-01-23 LAB — COMPREHENSIVE METABOLIC PANEL
ALT: 22 U/L (ref 0–44)
AST: 31 U/L (ref 15–41)
Albumin: 2.8 g/dL — ABNORMAL LOW (ref 3.5–5.0)
Alkaline Phosphatase: 118 U/L (ref 38–126)
Anion gap: 14 (ref 5–15)
BUN: 71 mg/dL — ABNORMAL HIGH (ref 8–23)
CO2: 13 mmol/L — ABNORMAL LOW (ref 22–32)
Calcium: 7.5 mg/dL — ABNORMAL LOW (ref 8.9–10.3)
Chloride: 109 mmol/L (ref 98–111)
Creatinine, Ser: 5.91 mg/dL — ABNORMAL HIGH (ref 0.44–1.00)
GFR, Estimated: 8 mL/min — ABNORMAL LOW (ref >60)
Glucose, Bld: 104 mg/dL — ABNORMAL HIGH (ref 70–99)
Potassium: 3.7 mmol/L (ref 3.5–5.1)
Sodium: 136 mmol/L (ref 135–145)
Total Bilirubin: 0.5 mg/dL (ref <1.2)
Total Protein: 6.3 g/dL — ABNORMAL LOW (ref 6.5–8.1)

## 2023-01-23 LAB — RESP PANEL BY RT-PCR (RSV, FLU A&B, COVID)  RVPGX2
Influenza A by PCR: NEGATIVE
Influenza B by PCR: NEGATIVE
Resp Syncytial Virus by PCR: NEGATIVE
SARS Coronavirus 2 by RT PCR: NEGATIVE

## 2023-01-23 LAB — POC OCCULT BLOOD, ED: Fecal Occult Bld: NEGATIVE

## 2023-01-23 NOTE — ED Provider Notes (Signed)
[place] EMERGENCY DEPARTMENT AT [facility] Provider Note   CSN: [id number] Arrival date & time: 01/23/23  2244     History  Chief Complaint  Patient presents with   Shortness of Breath    [name] is a 62 y.o. female.  The history is provided by the patient and medical records.  Shortness of Breath  62 year old female with history of hypertension, alcohol abuse, depression, hyperlipidemia, COPD, PFO, ESRD on HD (schedule Tue/Thur/Sat), presenting to the ED for SOB.  For the past 3 days she has been feeling unwell with cough and nasal congestion.  She feels a lot of congestion in her chest and is not able to cough anything up.  No reported fever or chills.  Upon EMS arrival she was 91% on room air.  She was given Solu-Medrol and placed on nonrebreather with improvement of her oxygen saturations.  She is not generally oxygen dependent.  Patient does have dialysis catheter right chest, she has been noncompliant with her treatments and reports she has not been to HD in several weeks.  She reports transportation issues and "no help from Child psychotherapist" as reasoning for this.  She does feel like she has some fluid in her abdomen and some swelling at her ankles.  She denies any chest pain.  Home Medications Prior to Admission medications   Medication Sig Start Date End Date Taking? Authorizing Provider  acetaminophen (TYLENOL) 500 MG tablet Take 1,000 mg by mouth 2 (two) times daily as needed for moderate pain, fever or headache.    [provider]  albuterol (VENTOLIN HFA) 108 (90 Base) MCG/ACT inhaler Inhale 2 puffs into the lungs every 6 (six) hours as needed for wheezing or shortness of breath. 11/02/22   Almon Hercules, MD  atorvastatin (LIPITOR) 10 MG tablet Take 1 tablet (10 mg total) by mouth daily. 11/03/22   Almon Hercules, MD  busPIRone (BUSPAR) 10 MG tablet Take 1 tablet (10 mg total) by mouth 2 (two) times daily. 11/02/22   Almon Hercules, MD  ferrous sulfate  325 (65 FE) MG tablet Take 1 tablet (325 mg total) by mouth daily with breakfast. 11/02/22   Almon Hercules, MD  Fluticasone-Umeclidin-Vilant (TRELEGY ELLIPTA) 100-62.5-25 MCG/ACT AEPB Inhale 1 puff into the lungs daily. 11/02/22   Almon Hercules, MD  furosemide (LASIX) 80 MG tablet Take 1 tablet (80 mg total) by mouth every Monday, Wednesday, and Friday. 11/04/22 11/04/23  Almon Hercules, MD  gabapentin (NEURONTIN) 100 MG capsule Take 1 capsule (100 mg total) by mouth 3 (three) times daily. 11/02/22   Almon Hercules, MD  hydrOXYzine (ATARAX) 25 MG tablet Take 1 tablet (25 mg total) by mouth 3 (three) times daily as needed for anxiety or itching. 11/02/22   Almon Hercules, MD  ipratropium-albuterol (DUONEB) 0.5-2.5 (3) MG/3ML SOLN Take 3 mLs by nebulization every 6 (six) hours as needed (shortness of breath and wheezing). Patient taking differently: Take 3 mLs by nebulization every 6 (six) hours as needed (shortness of breath, wheezing). 01/21/22   Claiborne Rigg, NP  multivitamin (RENA-VIT) TABS tablet Take 1 tablet by mouth at bedtime. 11/02/22   Almon Hercules, MD  oxyCODONE-acetaminophen (PERCOCET/ROXICET) 5-325 MG tablet Take 1 tablet by mouth every 8 (eight) hours as needed for severe pain or moderate pain. 08/05/22   Lorin Glass, MD  pantoprazole (PROTONIX) 40 MG tablet Take 1 tablet (40 mg total) by mouth 2 (two) times daily for 30 days,  THEN 1 tablet (40 mg total) daily. 11/02/22 01/31/23  Almon Hercules, MD  sodium bicarbonate 650 MG tablet Take 1 tablet (650 mg total) by mouth 3 (three) times daily. 11/02/22   Almon Hercules, MD  traZODone (DESYREL) 50 MG tablet Take 1 tablet (50 mg total) by mouth at bedtime as needed for sleep. 11/02/22   Almon Hercules, MD      Allergies    Zestril [lisinopril]    Review of Systems   Review of Systems  Respiratory:  Positive for shortness of breath.   All other systems reviewed and are negative.   Physical Exam Updated Vital Signs Pulse 93   Resp 20   Ht 5'  7" (1.702 m)   Wt 53.1 kg   SpO2 98%   BMI 18.33 kg/m   Physical Exam Vitals and nursing note reviewed.  Constitutional:      Appearance: She is well-developed.     Comments: Chronically ill appearing  HENT:     Head: Normocephalic and atraumatic.  Eyes:     Conjunctiva/sclera: Conjunctivae normal.     Pupils: Pupils are equal, round, and reactive to light.  Cardiovascular:     Rate and Rhythm: Normal rate and regular rhythm.     Heart sounds: Normal heart sounds.  Pulmonary:     Effort: Pulmonary effort is normal.     Breath sounds: Normal breath sounds. No wheezing or rhonchi.     Comments: Wet cough during exam Chest:     Comments: Dialysis catheter right chest wall, dressing appears dirty Abdominal:     General: Bowel sounds are normal.     Palpations: Abdomen is soft.  Genitourinary:    Comments: Exam chaperoned by NT Normal rectum, no gross blood, hemoccult negative Musculoskeletal:        General: Normal range of motion.     Cervical back: Normal range of motion.     Comments: Fistula RUE, thrill present  Skin:    General: Skin is warm and dry.  Neurological:     Mental Status: She is alert and oriented to person, place, and time.     ED Results / Procedures / Treatments   Labs (all labs ordered are listed, but only abnormal results are displayed) Labs Reviewed  CBC WITH DIFFERENTIAL/PLATELET - Abnormal; Notable for the following components:      Result Value   RBC 1.49 (*)    Hemoglobin 4.9 (*)    HCT 15.6 (*)    MCV 104.7 (*)    RDW 18.1 (*)    nRBC 1.6 (*)    All other components within normal limits  COMPREHENSIVE METABOLIC PANEL - Abnormal; Notable for the following components:   CO2 13 (*)    Glucose, Bld 104 (*)    BUN 71 (*)    Creatinine, Ser 5.91 (*)    Calcium 7.5 (*)    Total Protein 6.3 (*)    Albumin 2.8 (*)    GFR, Estimated 8 (*)    All other components within normal limits  IRON AND TIBC - Abnormal; Notable for the following  components:   Saturation Ratios 32 (*)    All other components within normal limits  FERRITIN - Abnormal; Notable for the following components:   Ferritin 488 (*)    All other components within normal limits  RETICULOCYTES - Abnormal; Notable for the following components:   Retic Ct Pct 5.8 (*)    RBC. 1.48 (*)  Immature Retic Fract 38.7 (*)    All other components within normal limits  I-STAT CHEM 8, ED - Abnormal; Notable for the following components:   Chloride 113 (*)    BUN 82 (*)    Creatinine, Ser 6.30 (*)    Calcium, Ion 1.11 (*)    TCO2 14 (*)    Hemoglobin 5.4 (*)    HCT 16.0 (*)    All other components within normal limits  RESP PANEL BY RT-PCR (RSV, FLU A&B, COVID)  RVPGX2  FOLATE  VITAMIN B12  HEMOGLOBIN AND HEMATOCRIT, BLOOD  HEMOGLOBIN AND HEMATOCRIT, BLOOD  HEMOGLOBIN AND HEMATOCRIT, BLOOD  HEMOGLOBIN AND HEMATOCRIT, BLOOD  COMPREHENSIVE METABOLIC PANEL  HIV ANTIBODY (ROUTINE TESTING W REFLEX)  POC OCCULT BLOOD, ED  TYPE AND SCREEN  PREPARE RBC (CROSSMATCH)  PREPARE RBC (CROSSMATCH)    EKG EKG Interpretation Date/Time:  Thursday January 23 2023 22:59:45 EST Ventricular Rate:  89 PR Interval:  159 QRS Duration:  85 QT Interval:  380 QTC Calculation: 463 R Axis:   65  Text Interpretation: Sinus rhythm Sinus pause Minimal ST depression, inferior leads artifact limites interpretation, appears to have some mild ST changes compared to previous Confirmed by Marily Memos [phone]) on 01/23/2023 11:26:31 PM  Radiology DG Chest Port 1 View  Result Date: 01/24/2023 CLINICAL DATA:  Shortness of breath, cough EXAM: PORTABLE CHEST 1 VIEW COMPARISON:  11/21/2022 FINDINGS: Right dialysis catheter tip at the cavoatrial junction, unchanged. Heart is normal size. Mediastinal contours within normal limits. Right pleural effusion and right lower lobe atelectasis or infiltrate. Small left pleural effusion with left base atelectasis. Diffuse interstitial prominence, favor  interstitial edema. IMPRESSION: Bilateral effusions and airspace opacities, right greater than left. Diffuse interstitial prominence, likely edema. Electronically Signed   By: Charlett Nose M.D.   On: 01/24/2023 00:22    Procedures Procedures    CRITICAL CARE Performed by: Garlon Hatchet   Total critical care time: 45 minutes  Critical care time was exclusive of separately billable procedures and treating other patients.  Critical care was necessary to treat or prevent imminent or life-threatening deterioration.  Critical care was time spent personally by me on the following activities: development of treatment plan with patient and/or surrogate as well as nursing, discussions with consultants, evaluation of patient's response to treatment, examination of patient, obtaining history from patient or surrogate, ordering and performing treatments and interventions, ordering and review of laboratory studies, ordering and review of radiographic studies, pulse oximetry and re-evaluation of patient's condition.   Medications Ordered in ED Medications  0.9 %  sodium chloride infusion (Manually program via Guardrails IV Fluids) (has no administration in time range)  atorvastatin (LIPITOR) tablet 10 mg (has no administration in time range)  furosemide (LASIX) injection 40 mg (has no administration in time range)  busPIRone (BUSPAR) tablet 10 mg (has no administration in time range)  hydrOXYzine (ATARAX) tablet 25 mg (has no administration in time range)  traZODone (DESYREL) tablet 50 mg (has no administration in time range)  sodium bicarbonate tablet 650 mg (has no administration in time range)  ferrous sulfate tablet 325 mg (has no administration in time range)  gabapentin (NEURONTIN) capsule 100 mg (has no administration in time range)  albuterol (PROVENTIL) (2.5 MG/3ML) 0.083% nebulizer solution 2.5 mg (has no administration in time range)  sodium chloride flush (NS) 0.9 % injection 3 mL (has  no administration in time range)  sodium chloride flush (NS) 0.9 % injection 3 mL (has no administration in  time range)  0.9 %  sodium chloride infusion (has no administration in time range)  acetaminophen (TYLENOL) tablet 650 mg (has no administration in time range)    Or  acetaminophen (TYLENOL) suppository 650 mg (has no administration in time range)  senna-docusate (Senokot-S) tablet 1 tablet (has no administration in time range)  ondansetron (ZOFRAN) tablet 4 mg (has no administration in time range)    Or  ondansetron (ZOFRAN) injection 4 mg (has no administration in time range)  hydrALAZINE (APRESOLINE) injection 10 mg (has no administration in time range)  0.9 %  sodium chloride infusion (Manually program via Guardrails IV Fluids) ( Intravenous New Bag/Given 01/24/23 0105)    ED Course/ Medical Decision Making/ A&P                                 Medical Decision Making Amount and/or Complexity of Data Reviewed Labs: ordered. Radiology: ordered and independent interpretation performed. ECG/medicine tests: ordered and independent interpretation performed.  Risk Prescription drug management. Decision regarding hospitalization.   62 year old female presenting to the ED with shortness of breath.  Has had cough and congestion over the past few days.  No reported fever.  Not complaint with her HD treatments, reports none in several weeks.  Sats 91% with EMS, given solu-medrol and placed on NRB.  On arrival patient is chronically ill-appearing but she is not acutely distressed.  Room air sats around 98%.  She does have a wet sounding cough.  Will obtain labs, chest x-ray, RVP.  Labs as above--hemoglobin 4.9.  Baseline is between 7-8 range based on prior values.  Hemoccult is negative.  She reports chronic hx of anemia, required transfusion x2 during last admission.  Anemia panel sent.  Fortunately, no electrolyte imbalance.  Potassium is normal at 3.7.  RVP is also negative.  Chest  x-ray with pulmonary edema.  Patient did somewhat desaturate here, around 88% on room air.  She was placed on 2 L and has been saturating well since that time.  She will require admission.  Will transfuse, start with 1 unit as to avoid further fluid overload.  Discussed with hospitalist, Dr. Janalyn Shy-- she will admit for ongoing care.  Admitting MD to discuss with nephrology for more urgent HD as likely will need 2nd unit of PRBCs.  Final Clinical Impression(s) / ED Diagnoses Final diagnoses:  Acute pulmonary edema (HCC)  Anemia due to chronic kidney disease, on chronic dialysis [name])    Rx / DC Orders ED Discharge Orders     None         Garlon Hatchet, PA-C 01/24/23 0121    Tegeler, Canary Brim, MD 01/27/23 1148

## 2023-01-23 NOTE — ED Triage Notes (Signed)
Chief Complaint  Patient presents with   Shortness of Breath   Pt presents to ED 21 via EMS from home with above.  Pt endorses shortness of breath for 3 days with cough.  Per EMS, pt 91% on room air upon arrival.  Pt placed on non-rebreather with improvement to 100%.  Pt also having bilateral leg swelling.  Pt states she has not been to dialysis in months due to "not feeling like it."  Pt given 125 mg Solumedrol en route.

## 2023-01-23 NOTE — ED Notes (Signed)
Pt placed on 2L [place] as sats were 88% on RA sats increase 95% on supplemental oxygen. PA-C Lisa aware and at the bedside

## 2023-01-24 ENCOUNTER — Inpatient Hospital Stay (HOSPITAL_COMMUNITY): Payer: Medicare Other

## 2023-01-24 ENCOUNTER — Other Ambulatory Visit (HOSPITAL_COMMUNITY): Payer: Medicaid Other

## 2023-01-24 ENCOUNTER — Encounter (HOSPITAL_COMMUNITY): Payer: Self-pay | Admitting: Internal Medicine

## 2023-01-24 DIAGNOSIS — I10 Essential (primary) hypertension: Secondary | ICD-10-CM

## 2023-01-24 DIAGNOSIS — N186 End stage renal disease: Secondary | ICD-10-CM

## 2023-01-24 DIAGNOSIS — Z992 Dependence on renal dialysis: Secondary | ICD-10-CM

## 2023-01-24 DIAGNOSIS — I5033 Acute on chronic diastolic (congestive) heart failure: Secondary | ICD-10-CM

## 2023-01-24 DIAGNOSIS — I5032 Chronic diastolic (congestive) heart failure: Secondary | ICD-10-CM | POA: Diagnosis not present

## 2023-01-24 DIAGNOSIS — F411 Generalized anxiety disorder: Secondary | ICD-10-CM | POA: Diagnosis present

## 2023-01-24 DIAGNOSIS — T82898A Other specified complication of vascular prosthetic devices, implants and grafts, initial encounter: Secondary | ICD-10-CM | POA: Diagnosis not present

## 2023-01-24 DIAGNOSIS — R0602 Shortness of breath: Secondary | ICD-10-CM | POA: Diagnosis present

## 2023-01-24 DIAGNOSIS — E8770 Fluid overload, unspecified: Secondary | ICD-10-CM

## 2023-01-24 DIAGNOSIS — E1122 Type 2 diabetes mellitus with diabetic chronic kidney disease: Secondary | ICD-10-CM | POA: Diagnosis present

## 2023-01-24 DIAGNOSIS — E8779 Other fluid overload: Secondary | ICD-10-CM | POA: Diagnosis not present

## 2023-01-24 DIAGNOSIS — Z91199 Patient's noncompliance with other medical treatment and regimen due to unspecified reason: Secondary | ICD-10-CM

## 2023-01-24 DIAGNOSIS — N2581 Secondary hyperparathyroidism of renal origin: Secondary | ICD-10-CM | POA: Diagnosis present

## 2023-01-24 DIAGNOSIS — D649 Anemia, unspecified: Secondary | ICD-10-CM

## 2023-01-24 DIAGNOSIS — F32A Depression, unspecified: Secondary | ICD-10-CM | POA: Diagnosis present

## 2023-01-24 DIAGNOSIS — E876 Hypokalemia: Secondary | ICD-10-CM | POA: Diagnosis present

## 2023-01-24 DIAGNOSIS — R636 Underweight: Secondary | ICD-10-CM | POA: Diagnosis present

## 2023-01-24 DIAGNOSIS — E785 Hyperlipidemia, unspecified: Secondary | ICD-10-CM | POA: Diagnosis present

## 2023-01-24 DIAGNOSIS — Q2112 Patent foramen ovale: Secondary | ICD-10-CM | POA: Diagnosis not present

## 2023-01-24 DIAGNOSIS — J4489 Other specified chronic obstructive pulmonary disease: Secondary | ICD-10-CM | POA: Diagnosis present

## 2023-01-24 DIAGNOSIS — K219 Gastro-esophageal reflux disease without esophagitis: Secondary | ICD-10-CM | POA: Diagnosis present

## 2023-01-24 DIAGNOSIS — Z79899 Other long term (current) drug therapy: Secondary | ICD-10-CM | POA: Diagnosis not present

## 2023-01-24 DIAGNOSIS — Z1152 Encounter for screening for COVID-19: Secondary | ICD-10-CM | POA: Diagnosis not present

## 2023-01-24 DIAGNOSIS — Z8709 Personal history of other diseases of the respiratory system: Secondary | ICD-10-CM

## 2023-01-24 DIAGNOSIS — F1010 Alcohol abuse, uncomplicated: Secondary | ICD-10-CM | POA: Diagnosis present

## 2023-01-24 DIAGNOSIS — Z681 Body mass index (BMI) 19 or less, adult: Secondary | ICD-10-CM | POA: Diagnosis not present

## 2023-01-24 DIAGNOSIS — Z5902 Unsheltered homelessness: Secondary | ICD-10-CM | POA: Diagnosis not present

## 2023-01-24 DIAGNOSIS — I132 Hypertensive heart and chronic kidney disease with heart failure and with stage 5 chronic kidney disease, or end stage renal disease: Secondary | ICD-10-CM | POA: Diagnosis present

## 2023-01-24 DIAGNOSIS — F17210 Nicotine dependence, cigarettes, uncomplicated: Secondary | ICD-10-CM | POA: Diagnosis present

## 2023-01-24 DIAGNOSIS — G4700 Insomnia, unspecified: Secondary | ICD-10-CM | POA: Diagnosis present

## 2023-01-24 DIAGNOSIS — D631 Anemia in chronic kidney disease: Secondary | ICD-10-CM | POA: Diagnosis present

## 2023-01-24 DIAGNOSIS — E875 Hyperkalemia: Secondary | ICD-10-CM | POA: Diagnosis present

## 2023-01-24 DIAGNOSIS — F5101 Primary insomnia: Secondary | ICD-10-CM

## 2023-01-24 DIAGNOSIS — T8241XA Breakdown (mechanical) of vascular dialysis catheter, initial encounter: Secondary | ICD-10-CM | POA: Diagnosis present

## 2023-01-24 DIAGNOSIS — Y712 Prosthetic and other implants, materials and accessory cardiovascular devices associated with adverse incidents: Secondary | ICD-10-CM | POA: Diagnosis present

## 2023-01-24 DIAGNOSIS — Z91158 Patient's noncompliance with renal dialysis for other reason: Secondary | ICD-10-CM

## 2023-01-24 LAB — COMPREHENSIVE METABOLIC PANEL
ALT: 21 U/L (ref 0–44)
AST: 26 U/L (ref 15–41)
Albumin: 2.7 g/dL — ABNORMAL LOW (ref 3.5–5.0)
Alkaline Phosphatase: 116 U/L (ref 38–126)
Anion gap: 15 (ref 5–15)
BUN: 67 mg/dL — ABNORMAL HIGH (ref 8–23)
CO2: 11 mmol/L — ABNORMAL LOW (ref 22–32)
Calcium: 7.6 mg/dL — ABNORMAL LOW (ref 8.9–10.3)
Chloride: 111 mmol/L (ref 98–111)
Creatinine, Ser: 5.86 mg/dL — ABNORMAL HIGH (ref 0.44–1.00)
GFR, Estimated: 8 mL/min — ABNORMAL LOW (ref >60)
Glucose, Bld: 172 mg/dL — ABNORMAL HIGH (ref 70–99)
Potassium: 4.2 mmol/L (ref 3.5–5.1)
Sodium: 137 mmol/L (ref 135–145)
Total Bilirubin: 0.8 mg/dL (ref <1.2)
Total Protein: 6.5 g/dL (ref 6.5–8.1)

## 2023-01-24 LAB — IRON AND TIBC
Iron: 86 ug/dL (ref 28–170)
Saturation Ratios: 32 % — ABNORMAL HIGH (ref 10.4–31.8)
TIBC: 269 ug/dL (ref 250–450)
UIBC: 183 ug/dL

## 2023-01-24 LAB — RETICULOCYTES
Immature Retic Fract: 38.7 % — ABNORMAL HIGH (ref 2.3–15.9)
RBC.: 1.48 MIL/uL — ABNORMAL LOW (ref 3.87–5.11)
Retic Count, Absolute: 85.8 10*3/uL (ref 19.0–186.0)
Retic Ct Pct: 5.8 % — ABNORMAL HIGH (ref 0.4–3.1)

## 2023-01-24 LAB — VITAMIN B12: Vitamin B-12: 715 pg/mL (ref 180–914)

## 2023-01-24 LAB — BRAIN NATRIURETIC PEPTIDE: B Natriuretic Peptide: 2324.5 pg/mL — ABNORMAL HIGH (ref 0.0–100.0)

## 2023-01-24 LAB — HEMOGLOBIN AND HEMATOCRIT, BLOOD
HCT: 26.3 % — ABNORMAL LOW (ref 36.0–46.0)
Hemoglobin: 8.4 g/dL — ABNORMAL LOW (ref 12.0–15.0)

## 2023-01-24 LAB — FOLATE: Folate: 21.4 ng/mL (ref >5.9)

## 2023-01-24 LAB — HEPATITIS B SURFACE ANTIGEN: Hepatitis B Surface Ag: NONREACTIVE

## 2023-01-24 LAB — FERRITIN: Ferritin: 488 ng/mL — ABNORMAL HIGH (ref 11–307)

## 2023-01-24 LAB — PREPARE RBC (CROSSMATCH)

## 2023-01-24 LAB — HIV ANTIBODY (ROUTINE TESTING W REFLEX): HIV Screen 4th Generation wRfx: NONREACTIVE

## 2023-01-24 MED ORDER — CHLORHEXIDINE GLUCONATE CLOTH 2 % EX PADS
6.0000 | MEDICATED_PAD | Freq: Every day | CUTANEOUS | Status: DC
  Administered 2023-01-24: 6 via TOPICAL

## 2023-01-24 MED ORDER — TRAZODONE HCL 50 MG PO TABS
50.0000 mg | ORAL_TABLET | Freq: Every evening | ORAL | Status: DC | PRN
  Administered 2023-01-24 – 2023-02-01 (×7): 50 mg via ORAL
  Filled 2023-01-24 (×8): qty 1

## 2023-01-24 MED ORDER — ACETAMINOPHEN 325 MG PO TABS
650.0000 mg | ORAL_TABLET | Freq: Four times a day (QID) | ORAL | Status: DC | PRN
  Administered 2023-01-24 – 2023-01-27 (×5): 650 mg via ORAL
  Filled 2023-01-24 (×5): qty 2

## 2023-01-24 MED ORDER — SODIUM CHLORIDE 0.9 % IV SOLN: 250.0000 mL | INTRAVENOUS | Status: DC | PRN

## 2023-01-24 MED ORDER — GABAPENTIN 100 MG PO CAPS
100.0000 mg | ORAL_CAPSULE | Freq: Three times a day (TID) | ORAL | Status: DC
  Administered 2023-01-24 – 2023-02-01 (×23): 100 mg via ORAL
  Filled 2023-01-24 (×23): qty 1

## 2023-01-24 MED ORDER — SODIUM CHLORIDE 0.9% IV SOLUTION: Freq: Once | INTRAVENOUS | Status: DC

## 2023-01-24 MED ORDER — HYDRALAZINE HCL 20 MG/ML IJ SOLN: 10.0000 mg | Freq: Three times a day (TID) | INTRAMUSCULAR | Status: DC | PRN

## 2023-01-24 MED ORDER — ACETAMINOPHEN 650 MG RE SUPP
650.0000 mg | Freq: Four times a day (QID) | RECTAL | Status: DC | PRN
Start: 2023-01-24 — End: 2023-01-28

## 2023-01-24 MED ORDER — ATORVASTATIN CALCIUM 10 MG PO TABS
10.0000 mg | ORAL_TABLET | Freq: Every day | ORAL | Status: DC
  Administered 2023-01-24 – 2023-02-01 (×8): 10 mg via ORAL
  Filled 2023-01-24 (×9): qty 1

## 2023-01-24 MED ORDER — SODIUM CHLORIDE 0.9% FLUSH: 3.0000 mL | Freq: Two times a day (BID) | INTRAVENOUS | Status: DC

## 2023-01-24 MED ORDER — ONDANSETRON HCL 4 MG PO TABS: 4.0000 mg | ORAL_TABLET | Freq: Four times a day (QID) | ORAL | Status: DC | PRN

## 2023-01-24 MED ORDER — HEPARIN SODIUM (PORCINE) 1000 UNIT/ML DIALYSIS: 1000.0000 [IU] | INTRAMUSCULAR | Status: DC | PRN

## 2023-01-24 MED ORDER — HEPARIN SODIUM (PORCINE) 1000 UNIT/ML DIALYSIS
40.0000 [IU]/kg | INTRAMUSCULAR | Status: DC | PRN
Start: 2023-01-24 — End: 2023-01-24

## 2023-01-24 MED ORDER — NEPRO/CARBSTEADY PO LIQD
237.0000 mL | ORAL | Status: DC | PRN
Start: 2023-01-24 — End: 2023-01-24
  Filled 2023-01-24: qty 237

## 2023-01-24 MED ORDER — SODIUM CHLORIDE 0.9% FLUSH: 3.0000 mL | INTRAVENOUS | Status: DC | PRN

## 2023-01-24 MED ORDER — VITAMIN B-12 100 MCG PO TABS
100.0000 ug | ORAL_TABLET | Freq: Every day | ORAL | Status: DC
  Administered 2023-01-24 – 2023-02-01 (×8): 100 ug via ORAL
  Filled 2023-01-24 (×10): qty 1

## 2023-01-24 MED ORDER — SENNOSIDES-DOCUSATE SODIUM 8.6-50 MG PO TABS
1.0000 | ORAL_TABLET | Freq: Every evening | ORAL | Status: DC | PRN
  Administered 2023-01-28: 1 via ORAL
  Filled 2023-01-24: qty 1

## 2023-01-24 MED ORDER — FUROSEMIDE 10 MG/ML IJ SOLN
40.0000 mg | Freq: Two times a day (BID) | INTRAMUSCULAR | Status: DC
  Administered 2023-01-24: 40 mg via INTRAVENOUS
  Filled 2023-01-24: qty 4

## 2023-01-24 MED ORDER — ALTEPLASE 2 MG IJ SOLR: 2.0000 mg | Freq: Once | INTRAMUSCULAR | Status: DC | PRN

## 2023-01-24 MED ORDER — FOLIC ACID 1 MG PO TABS
1.0000 mg | ORAL_TABLET | Freq: Every day | ORAL | Status: DC
  Administered 2023-01-24 – 2023-01-30 (×7): 1 mg via ORAL
  Filled 2023-01-24 (×9): qty 1

## 2023-01-24 MED ORDER — ALBUTEROL SULFATE (2.5 MG/3ML) 0.083% IN NEBU: 2.5000 mg | INHALATION_SOLUTION | Freq: Four times a day (QID) | RESPIRATORY_TRACT | Status: DC | PRN

## 2023-01-24 MED ORDER — IPRATROPIUM-ALBUTEROL 0.5-2.5 (3) MG/3ML IN SOLN
3.0000 mL | Freq: Four times a day (QID) | RESPIRATORY_TRACT | Status: DC | PRN
  Administered 2023-01-24 – 2023-01-26 (×3): 3 mL via RESPIRATORY_TRACT
  Filled 2023-01-24 (×3): qty 3

## 2023-01-24 MED ORDER — GABAPENTIN 100 MG PO CAPS: 100.0000 mg | ORAL_CAPSULE | Freq: Three times a day (TID) | ORAL | Status: DC

## 2023-01-24 MED ORDER — PANTOPRAZOLE SODIUM 40 MG PO TBEC
40.0000 mg | DELAYED_RELEASE_TABLET | Freq: Every day | ORAL | Status: DC
  Administered 2023-01-24 – 2023-01-30 (×7): 40 mg via ORAL
  Filled 2023-01-24 (×9): qty 1

## 2023-01-24 MED ORDER — SODIUM BICARBONATE 650 MG PO TABS
650.0000 mg | ORAL_TABLET | Freq: Three times a day (TID) | ORAL | Status: DC
  Administered 2023-01-24 – 2023-01-31 (×22): 650 mg via ORAL
  Filled 2023-01-24 (×22): qty 1

## 2023-01-24 MED ORDER — BUSPIRONE HCL 5 MG PO TABS
10.0000 mg | ORAL_TABLET | Freq: Two times a day (BID) | ORAL | Status: DC
  Administered 2023-01-24 – 2023-01-31 (×16): 10 mg via ORAL
  Filled 2023-01-24 (×9): qty 2
  Filled 2023-01-24: qty 1
  Filled 2023-01-24 (×5): qty 2
  Filled 2023-01-24: qty 1
  Filled 2023-01-24: qty 2

## 2023-01-24 MED ORDER — ONDANSETRON HCL 4 MG/2ML IJ SOLN: 4.0000 mg | Freq: Four times a day (QID) | INTRAMUSCULAR | Status: DC | PRN

## 2023-01-24 MED ORDER — GUAIFENESIN ER 600 MG PO TB12
600.0000 mg | ORAL_TABLET | Freq: Two times a day (BID) | ORAL | Status: AC
  Administered 2023-01-24 – 2023-01-30 (×14): 600 mg via ORAL
  Filled 2023-01-24 (×14): qty 1

## 2023-01-24 MED ORDER — ANTICOAGULANT SODIUM CITRATE 4% (200MG/5ML) IV SOLN
5.0000 mL | Status: DC | PRN
  Filled 2023-01-24: qty 5

## 2023-01-24 MED ORDER — FERROUS SULFATE 325 (65 FE) MG PO TABS
325.0000 mg | ORAL_TABLET | Freq: Every day | ORAL | Status: DC
  Administered 2023-01-24 – 2023-01-31 (×8): 325 mg via ORAL
  Filled 2023-01-24 (×8): qty 1

## 2023-01-24 MED ORDER — SODIUM CHLORIDE 0.9% IV SOLUTION: Freq: Once | INTRAVENOUS | Status: AC

## 2023-01-24 MED ORDER — HYDROXYZINE HCL 25 MG PO TABS
25.0000 mg | ORAL_TABLET | Freq: Three times a day (TID) | ORAL | Status: DC | PRN
  Administered 2023-01-27 – 2023-02-01 (×8): 25 mg via ORAL
  Filled 2023-01-24 (×9): qty 1

## 2023-01-24 MED ORDER — FUROSEMIDE 10 MG/ML IJ SOLN
40.0000 mg | Freq: Two times a day (BID) | INTRAMUSCULAR | Status: DC
  Administered 2023-01-24 – 2023-01-25 (×3): 40 mg via INTRAVENOUS
  Filled 2023-01-24 (×3): qty 4

## 2023-01-24 NOTE — Progress Notes (Addendum)
Received patient in bed to unit.  Alert and oriented.  Informed consent signed and in chart.   TX duration: 1:31 Patient tolerated well.  Transported back to the room  Alert, without acute distress.  Hand-off given to patient's nurse.   Access used: right AVF Access issues: alarmed throughout the whole tx high vp wouldn't run tx terminated  Total UF removed: 500 Medication(s) given: none   01/24/23 1729  Vitals  Temp 97.8 F (36.6 C)  Temp Source Oral  BP (!) 184/88  MAP (mmHg) 111  BP Location Left Arm  BP Method Automatic  Patient Position (if appropriate) Lying  Pulse Rate 80  Pulse Rate Source Monitor  ECG Heart Rate 77  Resp (!) 26  Oxygen Therapy  SpO2 100 %  O2 Device Nasal Cannula  O2 Flow Rate (L/min) 2 L/min  During Treatment Monitoring  Blood Flow Rate (mL/min) 199 mL/min  Arterial Pressure (mmHg) -254.13 mmHg  Venous Pressure (mmHg) 252.92 mmHg  TMP (mmHg) 48.08 mmHg  Ultrafiltration Rate (mL/min) 1891 mL/min  Dialysate Flow Rate (mL/min) 300 ml/min  Duration of HD Treatment -hour(s) 1.28 hour(s)  Cumulative Fluid Removed (mL) per Treatment  505.23  Intra-Hemodialysis Comments  (tx terminated access keep alarming with high VP wont run, pt c/o of pain from access.  pt blood returned.)  Dialysis Fluid Bolus Normal Saline  Bolus Amount (mL) 300 mL      Arlene Genova [facility]

## 2023-01-24 NOTE — Progress Notes (Signed)
Advised by nephrologist of pt's admission. Pt well known to navigator. On pt's last admission, pt had been accepted at [facility] for out-pt HD. Clinic social worker arranged transportation to pick pt up at the motel pt was currently staying at, but pt never attended treatment at clinic. Clinic held pt's appt for quite sometime but eventually pt was d/c due to never starting at clinic. During the time prior to d/c from clinic, staff called pt to inquire if she was coming to HD and pt advised staff that she would return to hospital when she needed HD. Pt has been d/c from 2 local clinics and it is unknown if pt would be accepted at any other local clinics. Pt would need to be agreeable to attend treatments at a local clinic and pt's d/c disposition would need to be known so most appropriate clinic could be requested if pt to be re-clipped. Pt's social barriers make pt's case very complex. Will assist as needed.   Olivia Canter Renal Navigator [phone]

## 2023-01-24 NOTE — Consult Note (Signed)
Reason for Consult: Continuity of ESRD care Referring Physician: Zannie Cove, MD [facility])  HPI:  62 year old woman with past medical history significant for hypertension, COPD with ongoing tobacco use, history of polysubstance abuse, social difficulties including homelessness and end-stage renal disease.  She has unfortunately had spotty adherence to outpatient dialysis due to lack of a stable housing situation and has not been there since her discharge from the hospital 2 months ago.  She presented to the emergency room yesterday with increasing shortness of breath for the past 3 or 4 days with intermittent leg swelling and cough productive of clear sputum.  She denied any fever but reported some chills.  Denied any chest pain, hemoptysis, nausea, vomiting or diarrhea.  She denied any leg ulcers, wounds or lower extremity discoloration.  In the emergency room found to be hypoxic with negative respiratory viral panel.  Labs showed significant anemia with a hemoglobin of 4.9 and chest x-ray shows evidence of pulmonary edema/pleural effusions.  Past Medical History:  Diagnosis Date   Alcohol abuse    Allergy    Anxiety    Arthritis    Asthma    Cannabis abuse    Cocaine abuse (HCC)    COPD (chronic obstructive pulmonary disease) (HCC)    Depression    Drug addiction (HCC)    GERD (gastroesophageal reflux disease)    Heart murmur    Homelessness    Hyperlipidemia    Hypertension    pt stated "every once in a while BP will be high but has not been prescribed medication for HTN.    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) 02/22/2016    Past Surgical History:  Procedure Laterality Date   AV FISTULA PLACEMENT Right 08/14/2022   Procedure: RIGHT ARM BRACHIOBASILIC ATERIOVENOUS FISTULA CREATION;  Surgeon: Leonie Douglas, MD;  Location: [facility] OR;  Service: Vascular;   Laterality: Right;   BASCILIC VEIN TRANSPOSITION Right 11/13/2022   Procedure: RIGHT ARM SECOND STAGE BASILIC VEIN TRANSPOSITION;  Surgeon: Leonie Douglas, MD;  Location: [facility] OR;  Service: Vascular;  Laterality: Right;   BIOPSY  01/01/2019   Procedure: BIOPSY;  Surgeon: Beverley Fiedler, MD;  Location: [facility] ENDOSCOPY;  Service: Endoscopy;;   BIOPSY  11/17/2021   Procedure: BIOPSY;  Surgeon: Lynann Bologna, MD;  Location: [facility] ENDOSCOPY;  Service: Gastroenterology;;   CESAREAN SECTION  1989   COLONOSCOPY  11/07/2020   2018   CYSTOSCOPY W/ URETERAL STENT PLACEMENT Left 08/01/2018   Procedure: CYSTOSCOPY WITH RETROGRADE PYELOGRAM/URETERAL STENT PLACEMENT;  Surgeon: Crista Elliot, MD;  Location: [facility] ORS;  Service: Urology;  Laterality: Left;   CYSTOSCOPY WITH RETROGRADE PYELOGRAM, URETEROSCOPY AND STENT PLACEMENT Left 01/15/2019   Procedure: CYSTOSCOPY WITH RETROGRADE PYELOGRAM, URETEROSCOPY AND STENT PLACEMENT;  Surgeon: Crista Elliot, MD;  Location: [facility] ORS;  Service: Urology;  Laterality: Left;   ENTEROSCOPY N/A 08/16/2022   Procedure: ENTEROSCOPY;  Surgeon: Meridee Score Netty Starring., MD;  Location: [facility] ENDOSCOPY;  Service: Gastroenterology;  Laterality: N/A;   ENTEROSCOPY N/A 10/31/2022   Procedure: ENTEROSCOPY;  Surgeon: Beverley Fiedler, MD;  Location: [facility] ENDOSCOPY;  Service: Gastroenterology;  Laterality: N/A;   ESOPHAGOGASTRODUODENOSCOPY (EGD) WITH PROPOFOL N/A 01/01/2019   Procedure: ESOPHAGOGASTRODUODENOSCOPY (EGD) WITH PROPOFOL;  Surgeon: Beverley Fiedler, MD;  Location: [facility] ENDOSCOPY;  Service: Endoscopy;  Laterality: N/A;   ESOPHAGOGASTRODUODENOSCOPY (EGD)  WITH PROPOFOL N/A 08/21/2019   Procedure: ESOPHAGOGASTRODUODENOSCOPY (EGD) WITH PROPOFOL;  Surgeon: Napoleon Form, MD;  Location: [facility] ENDOSCOPY;  Service: Endoscopy;  Laterality: N/A;   ESOPHAGOGASTRODUODENOSCOPY (EGD) WITH PROPOFOL N/A 11/17/2021   Procedure: ESOPHAGOGASTRODUODENOSCOPY (EGD) WITH PROPOFOL;  Surgeon: Lynann Bologna, MD;   Location: [facility] ENDOSCOPY;  Service: Gastroenterology;  Laterality: N/A;   FRACTURE SURGERY Left 2011   arm   HEMOSTASIS CLIP PLACEMENT  08/16/2022   Procedure: HEMOSTASIS CLIP PLACEMENT;  Surgeon: Lemar Lofty., MD;  Location: [facility] ENDOSCOPY;  Service: Gastroenterology;;   HOT HEMOSTASIS N/A 08/16/2022   Procedure: HOT HEMOSTASIS (ARGON PLASMA COAGULATION/BICAP);  Surgeon: Lemar Lofty., MD;  Location: [facility] ENDOSCOPY;  Service: Gastroenterology;  Laterality: N/A;   HOT HEMOSTASIS N/A 10/31/2022   Procedure: HOT HEMOSTASIS (ARGON PLASMA COAGULATION/BICAP);  Surgeon: Beverley Fiedler, MD;  Location: [facility] ENDOSCOPY;  Service: Gastroenterology;  Laterality: N/A;   INSERTION OF DIALYSIS CATHETER Right 08/14/2022   Procedure: INSERTION OF TUNNELED DIALYSIS CATHETER USING PALINDROME CATHETER KIT 19CM;  Surgeon: Leonie Douglas, MD;  Location: [facility] OR;  Service: Vascular;  Laterality: Right;   POLYPECTOMY  08/16/2022   Procedure: POLYPECTOMY;  Surgeon: Meridee Score Netty Starring., MD;  Location: [facility] ENDOSCOPY;  Service: Gastroenterology;;   SUBMUCOSAL TATTOO INJECTION  08/16/2022   Procedure: SUBMUCOSAL TATTOO INJECTION;  Surgeon: Lemar Lofty., MD;  Location: [facility] ENDOSCOPY;  Service: Gastroenterology;;   UPPER GASTROINTESTINAL ENDOSCOPY      Family History  Problem Relation Age of Onset   Diabetes Father    Colon cancer Neg Hx    Esophageal cancer Neg Hx    Rectal cancer Neg Hx    Stomach cancer Neg Hx     Social History:  reports that she has been smoking cigarettes. She has been exposed to tobacco smoke. She has never used smokeless tobacco. She reports that she does not currently use alcohol. She reports current drug use. Drug: Marijuana.  Allergies:  Allergies  Allergen Reactions   Zestril [Lisinopril] Other (See Comments)    Hyperkalemia    Medications: I have reviewed the patient's current medications. Scheduled:  sodium chloride   Intravenous Once   atorvastatin  10 mg  Oral Daily   busPIRone  10 mg Oral BID   Chlorhexidine Gluconate Cloth  6 each Topical Q0600   ferrous sulfate  325 mg Oral Q breakfast   folic acid  1 mg Oral Daily   furosemide  40 mg Intravenous BID   gabapentin  100 mg Oral TID   guaiFENesin  600 mg Oral BID   pantoprazole  40 mg Oral Daily   sodium bicarbonate  650 mg Oral TID   vitamin B-12  100 mcg Oral Daily   Continuous:     Latest Ref Rng & Units 01/24/2023    7:39 AM 01/23/2023   11:06 PM 01/23/2023   10:56 PM  BMP  Glucose 70 - 99 mg/dL 161  98  096   BUN 8 - 23 mg/dL 67  82  71   Creatinine 0.44 - 1.00 mg/dL 0.45  4.09  8.11   Sodium 135 - 145 mmol/L 137  137  136   Potassium 3.5 - 5.1 mmol/L 4.2  3.8  3.7   Chloride 98 - 111 mmol/L 111  113  109   CO2 22 - 32 mmol/L 11   13   Calcium 8.9 - 10.3 mg/dL 7.6   7.5       Latest Ref Rng & Units 01/24/2023  7:40 AM 01/23/2023   11:06 PM 01/23/2023   10:56 PM  CBC  WBC 4.0 - 10.5 K/uL   5.6   Hemoglobin 12.0 - 15.0 g/dL 8.4  5.4  4.9   Hematocrit 36.0 - 46.0 % 26.3  16.0  15.6   Platelets 150 - 400 K/uL   382      DG Chest Port 1 View  Result Date: 01/24/2023 CLINICAL DATA:  Shortness of breath, cough EXAM: PORTABLE CHEST 1 VIEW COMPARISON:  11/21/2022 FINDINGS: Right dialysis catheter tip at the cavoatrial junction, unchanged. Heart is normal size. Mediastinal contours within normal limits. Right pleural effusion and right lower lobe atelectasis or infiltrate. Small left pleural effusion with left base atelectasis. Diffuse interstitial prominence, favor interstitial edema. IMPRESSION: Bilateral effusions and airspace opacities, right greater than left. Diffuse interstitial prominence, likely edema. Electronically Signed   By: Charlett Nose M.D.   On: 01/24/2023 00:22    Review of Systems  Constitutional:  Positive for chills and fatigue. Negative for appetite change and fever.  HENT:  Positive for sneezing. Negative for nosebleeds, sore throat and trouble swallowing.    Eyes:  Negative for redness and visual disturbance.  Respiratory:  Positive for cough, shortness of breath and wheezing.   Cardiovascular:  Positive for leg swelling. Negative for chest pain.  Gastrointestinal:  Negative for abdominal pain, diarrhea, nausea and vomiting.  Genitourinary:  Negative for dysuria, urgency and vaginal bleeding.  Musculoskeletal:  Negative for back pain and gait problem.  Skin:  Negative for rash and wound.  Neurological:  Negative for tremors, weakness and headaches.  All other systems reviewed and are negative.  Blood pressure (!) 165/82, pulse 89, temperature 98.2 F (36.8 C), temperature source Oral, resp. rate 19, height 5\' 7"  (1.702 m), weight 53.1 kg, SpO2 95%. Physical Exam Vitals reviewed.  Constitutional:      General: She is not in acute distress.    Appearance: She is well-developed. She is ill-appearing.  HENT:     Head: Atraumatic.     Mouth/Throat:     Pharynx: Oropharynx is clear.  Eyes:     Extraocular Movements: Extraocular movements intact.  Neck:     Vascular: JVD present.     Comments: JVP 11 to 12 cm Cardiovascular:     Rate and Rhythm: Normal rate and regular rhythm.     Pulses: Normal pulses.     Heart sounds: Normal heart sounds.  Pulmonary:     Effort: Pulmonary effort is normal.     Breath sounds: Examination of the right-middle field reveals rhonchi and rales. Examination of the right-lower field reveals decreased breath sounds. Examination of the left-lower field reveals decreased breath sounds. Decreased breath sounds, rhonchi and rales present.     Comments: Right IJ TDC Abdominal:     General: Bowel sounds are normal.     Palpations: Abdomen is soft. There is no mass.     Tenderness: There is no guarding.  Musculoskeletal:        General: Normal range of motion.     Cervical back: Normal range of motion.     Right lower leg: No edema.     Left lower leg: No edema.  Skin:    General: Skin is warm and dry.   Neurological:     General: No focal deficit present.     Mental Status: She is alert and oriented to person, place, and time.    Assessment/Plan: 1.  Volume overload: With evidence of  pulmonary edema but not significant third spacing and this patient with end-stage renal disease and nonadherence to dialysis due to social economic reasons.  Will undertake dialysis today. 2.  Anemia: Suspected to have an element of acute blood loss anemia compounded by anemia of chronic kidney disease and missed dialysis/ESA doses.  Status post PRBC transfusion with additional workup per primary service. 3.  End-stage renal disease: Difficult situation because the patient remains homeless and does not have the ability to get reliable transportation to dialysis.  She was last accepted to [facility] but has since been discharged because of nonadherence. 4.  Hypertension: Elevated blood pressures noted, monitor with hemodialysis/ultrafiltration. 5.  Secondary hyperparathyroidism: Will follow-up on phosphorus level and monitor trend for calcium to decide on need for binder.  Dagoberto Ligas 01/24/2023, 11:14 AM

## 2023-01-24 NOTE — ED Notes (Signed)
ED TO INPATIENT HANDOFF REPORT  ED Nurse Name and Phone #: [phone]  S Name/Age/Gender [name] 62 y.o. female Room/Bed: 043C/043C  Code Status   Code Status: Full Code  Home/SNF/Other  Patient oriented to: self, place, time, and situation Is this baseline? Yes   Triage Complete: Triage complete  Chief Complaint Acute on chronic anemia [D64.9]  Triage Note Chief Complaint  Patient presents with   Shortness of Breath   Pt presents to ED 21 via EMS from home with above.  Pt endorses shortness of breath for 3 days with cough.  Per EMS, pt 91% on room air upon arrival.  Pt placed on non-rebreather with improvement to 100%.  Pt also having bilateral leg swelling.  Pt states she has not been to dialysis in months due to "not feeling like it."  Pt given 125 mg Solumedrol en route.   Allergies Allergies  Allergen Reactions   Zestril [Lisinopril] Other (See Comments)    Hyperkalemia    Level of Care/Admitting Diagnosis ED Disposition     ED Disposition  Admit   Condition  --   Comment  Hospital Area: [facility] [[id number]]  Level of Care: Progressive [102]  Admit to Progressive based on following criteria: Other see comments  Comments: Acute anemia hemoglobin 4.9.  May admit patient to [facility] or [facility] if equivalent level of care is available:: No  Covid Evaluation: Confirmed COVID Negative  Diagnosis: Acute on chronic anemia [[id number]]  Admitting Physician: Tereasa Coop [[id number]]  Attending Physician: Tereasa Coop [[id number]]  Certification:: I certify this patient will need inpatient services for at least 2 midnights  Expected Medical Readiness: 01/29/2023          B Medical/Surgery History Past Medical History:  Diagnosis Date   Alcohol abuse    Allergy    Anxiety    Arthritis    Asthma    Cannabis abuse    Cocaine abuse (HCC)    COPD (chronic obstructive pulmonary disease) (HCC)    Depression    Drug  addiction (HCC)    GERD (gastroesophageal reflux disease)    Heart murmur    Homelessness    Hyperlipidemia    Hypertension    pt stated "every once in a while BP will be high but has not been prescribed medication for HTN.    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) 02/22/2016   Past Surgical History:  Procedure Laterality Date   AV FISTULA PLACEMENT Right 08/14/2022   Procedure: RIGHT ARM BRACHIOBASILIC ATERIOVENOUS FISTULA CREATION;  Surgeon: Leonie Douglas, MD;  Location: [facility] OR;  Service: Vascular;  Laterality: Right;   BASCILIC VEIN TRANSPOSITION Right 11/13/2022   Procedure: RIGHT ARM SECOND STAGE BASILIC VEIN TRANSPOSITION;  Surgeon: Leonie Douglas, MD;  Location: [facility] OR;  Service: Vascular;  Laterality: Right;   BIOPSY  01/01/2019   Procedure: BIOPSY;  Surgeon: Beverley Fiedler, MD;  Location: [facility] ENDOSCOPY;  Service: Endoscopy;;   BIOPSY  11/17/2021   Procedure: BIOPSY;  Surgeon: Lynann Bologna, MD;  Location: [facility] ENDOSCOPY;  Service: Gastroenterology;;   CESAREAN SECTION  1989   COLONOSCOPY  11/07/2020   2018   CYSTOSCOPY W/ URETERAL STENT PLACEMENT Left 08/01/2018   Procedure: CYSTOSCOPY WITH RETROGRADE PYELOGRAM/URETERAL STENT PLACEMENT;  Surgeon: Crista Elliot, MD;  Location: [facility] ORS;  Service: Urology;  Laterality: Left;   CYSTOSCOPY WITH RETROGRADE PYELOGRAM, URETEROSCOPY AND STENT PLACEMENT Left 01/15/2019   Procedure: CYSTOSCOPY WITH RETROGRADE PYELOGRAM, URETEROSCOPY AND STENT PLACEMENT;  Surgeon: Crista Elliot, MD;  Location: [facility] ORS;  Service: Urology;  Laterality: Left;   ENTEROSCOPY N/A 08/16/2022   Procedure: ENTEROSCOPY;  Surgeon: Meridee Score Netty Starring., MD;  Location: [facility] ENDOSCOPY;  Service: Gastroenterology;  Laterality: N/A;   ENTEROSCOPY N/A 10/31/2022   Procedure: ENTEROSCOPY;  Surgeon: Beverley Fiedler, MD;  Location: [facility] ENDOSCOPY;   Service: Gastroenterology;  Laterality: N/A;   ESOPHAGOGASTRODUODENOSCOPY (EGD) WITH PROPOFOL N/A 01/01/2019   Procedure: ESOPHAGOGASTRODUODENOSCOPY (EGD) WITH PROPOFOL;  Surgeon: Beverley Fiedler, MD;  Location: [facility] ENDOSCOPY;  Service: Endoscopy;  Laterality: N/A;   ESOPHAGOGASTRODUODENOSCOPY (EGD) WITH PROPOFOL N/A 08/21/2019   Procedure: ESOPHAGOGASTRODUODENOSCOPY (EGD) WITH PROPOFOL;  Surgeon: Napoleon Form, MD;  Location: [facility] ENDOSCOPY;  Service: Endoscopy;  Laterality: N/A;   ESOPHAGOGASTRODUODENOSCOPY (EGD) WITH PROPOFOL N/A 11/17/2021   Procedure: ESOPHAGOGASTRODUODENOSCOPY (EGD) WITH PROPOFOL;  Surgeon: Lynann Bologna, MD;  Location: [facility] ENDOSCOPY;  Service: Gastroenterology;  Laterality: N/A;   FRACTURE SURGERY Left 2011   arm   HEMOSTASIS CLIP PLACEMENT  08/16/2022   Procedure: HEMOSTASIS CLIP PLACEMENT;  Surgeon: Lemar Lofty., MD;  Location: [facility] ENDOSCOPY;  Service: Gastroenterology;;   HOT HEMOSTASIS N/A 08/16/2022   Procedure: HOT HEMOSTASIS (ARGON PLASMA COAGULATION/BICAP);  Surgeon: Lemar Lofty., MD;  Location: [facility] ENDOSCOPY;  Service: Gastroenterology;  Laterality: N/A;   HOT HEMOSTASIS N/A 10/31/2022   Procedure: HOT HEMOSTASIS (ARGON PLASMA COAGULATION/BICAP);  Surgeon: Beverley Fiedler, MD;  Location: [facility] ENDOSCOPY;  Service: Gastroenterology;  Laterality: N/A;   INSERTION OF DIALYSIS CATHETER Right 08/14/2022   Procedure: INSERTION OF TUNNELED DIALYSIS CATHETER USING PALINDROME CATHETER KIT 19CM;  Surgeon: Leonie Douglas, MD;  Location: [facility] OR;  Service: Vascular;  Laterality: Right;   POLYPECTOMY  08/16/2022   Procedure: POLYPECTOMY;  Surgeon: Meridee Score Netty Starring., MD;  Location: [facility] ENDOSCOPY;  Service: Gastroenterology;;   SUBMUCOSAL TATTOO INJECTION  08/16/2022   Procedure: SUBMUCOSAL TATTOO INJECTION;  Surgeon: Lemar Lofty., MD;  Location: [facility] ENDOSCOPY;  Service: Gastroenterology;;   UPPER GASTROINTESTINAL ENDOSCOPY       A IV  Location/Drains/Wounds Patient Lines/Drains/Airways Status     Active Line/Drains/Airways     Name Placement date Placement time Site Days   Peripheral IV 01/23/23 20 G Left Antecubital 01/23/23  --  Antecubital  1   Peripheral IV 01/23/23 20 G Distal;Left;Posterior Forearm 01/23/23  2350  Forearm  1   Fistula / Graft Right Upper arm Arteriovenous fistula 08/14/22  1230  Upper arm  163   Hemodialysis Catheter Right Internal jugular Double lumen Permanent (Tunneled) 08/14/22  1144  Internal jugular  163   Pressure Injury 11/10/22 Coccyx Mid Stage 2 -  Partial thickness loss of dermis presenting as a shallow open injury with a red, pink wound bed without slough. 11/10/22  0205  -- 75   Pressure Injury 11/10/22 Buttocks Left Stage 2 -  Partial thickness loss of dermis presenting as a shallow open injury with a red, pink wound bed without slough. 11/10/22  0205  -- 75   Wound / Incision (Open or Dehisced) 08/13/22 Other (Comment) Sacrum Left purple raised bruise 08/13/22  1102  Sacrum  164   Wound / Incision (Open or Dehisced) 08/13/22 Other (Comment) Labia Lesions 08/13/22  1104  Labia  164            Intake/Output Last  24 hours  Intake/Output Summary (Last 24 hours) at 01/24/2023 1231 Last data filed at 01/24/2023 0827 Gross per 24 hour  Intake 843.99 ml  Output 400 ml  Net 443.99 ml    Labs/Imaging Results for orders placed or performed during the hospital encounter of 01/23/23 (from the past 48 hour(s))  Brain natriuretic peptide     Status: Abnormal   Collection Time: 01/23/23  2:00 AM  Result Value Ref Range   B Natriuretic Peptide 2,324.5 (H) 0.0 - 100.0 pg/mL    Comment: Performed at [facility], [address], [place], [place] [address]  Resp panel by RT-PCR (RSV, Flu A&B, Covid) Anterior Nasal Swab     Status: None   Collection Time: 01/23/23 10:51 PM   Specimen: Anterior Nasal Swab  Result Value Ref Range   SARS Coronavirus 2 by RT PCR NEGATIVE NEGATIVE    Influenza A by PCR NEGATIVE NEGATIVE   Influenza B by PCR NEGATIVE NEGATIVE    Comment: (NOTE) The Xpert Xpress SARS-CoV-2/FLU/RSV plus assay is intended as an aid in the diagnosis of influenza from Nasopharyngeal swab specimens and should not be used as a sole basis for treatment. Nasal washings and aspirates are unacceptable for Xpert Xpress SARS-CoV-2/FLU/RSV testing.  Fact Sheet for Patients: BloggerCourse.com  Fact Sheet for Healthcare Providers: SeriousBroker.it  This test is not yet approved or cleared by the [place] FDA and has been authorized for detection and/or diagnosis of SARS-CoV-2 by FDA under an Emergency Use Authorization (EUA). This EUA will remain in effect (meaning this test can be used) for the duration of the COVID-19 declaration under Section 564(b)(1) of the Act, 21 U.S.C. section 360bbb-3(b)(1), unless the authorization is terminated or revoked.     Resp Syncytial Virus by PCR NEGATIVE NEGATIVE    Comment: (NOTE) Fact Sheet for Patients: BloggerCourse.com  Fact Sheet for Healthcare Providers: SeriousBroker.it  This test is not yet approved or cleared by the [place] FDA and has been authorized for detection and/or diagnosis of SARS-CoV-2 by FDA under an Emergency Use Authorization (EUA). This EUA will remain in effect (meaning this test can be used) for the duration of the COVID-19 declaration under Section 564(b)(1) of the Act, 21 U.S.C. section 360bbb-3(b)(1), unless the authorization is terminated or revoked.  Performed at [facility], [address], [place], [place] [address]   CBC with Differential     Status: Abnormal   Collection Time: 01/23/23 10:56 PM  Result Value Ref Range   WBC 5.6 4.0 - 10.5 K/uL   RBC 1.49 (L) 3.87 - 5.11 MIL/uL   Hemoglobin 4.9 (LL) 12.0 - 15.0 g/dL    Comment: REPEATED TO VERIFY THIS CRITICAL  RESULT HAS VERIFIED AND BEEN CALLED TO A [place] RN BY JALEESA WHITE ON 11 07 2024 AT 2330, AND HAS BEEN READ BACK.     HCT 15.6 (L) 36.0 - 46.0 %   MCV 104.7 (H) 80.0 - 100.0 fL   MCH 32.9 26.0 - 34.0 pg   MCHC 31.4 30.0 - 36.0 g/dL   RDW 33.2 (H) 95.1 - 88.4 %   Platelets 382 150 - 400 K/uL   nRBC 1.6 (H) 0.0 - 0.2 %   Neutrophils Relative % 51 %   Neutro Abs 2.9 1.7 - 7.7 K/uL   Lymphocytes Relative 33 %   Lymphs Abs 1.8 0.7 - 4.0 K/uL   Monocytes Relative 12 %   Monocytes Absolute 0.7 0.1 - 1.0 K/uL   Eosinophils  Relative 2 %   Eosinophils Absolute 0.1 0.0 - 0.5 K/uL   Basophils Relative 1 %   Basophils Absolute 0.0 0.0 - 0.1 K/uL   Immature Granulocytes 1 %   Abs Immature Granulocytes 0.07 0.00 - 0.07 K/uL    Comment: Performed at [facility], [address], [place], [place] [address]  Comprehensive metabolic panel     Status: Abnormal   Collection Time: 01/23/23 10:56 PM  Result Value Ref Range   Sodium 136 135 - 145 mmol/L   Potassium 3.7 3.5 - 5.1 mmol/L   Chloride 109 98 - 111 mmol/L   CO2 13 (L) 22 - 32 mmol/L   Glucose, Bld 104 (H) 70 - 99 mg/dL    Comment: Glucose reference range applies only to samples taken after fasting for at least 8 hours.   BUN 71 (H) 8 - 23 mg/dL   Creatinine, Ser 5.28 (H) 0.44 - 1.00 mg/dL   Calcium 7.5 (L) 8.9 - 10.3 mg/dL   Total Protein 6.3 (L) 6.5 - 8.1 g/dL   Albumin 2.8 (L) 3.5 - 5.0 g/dL   AST 31 15 - 41 U/L   ALT 22 0 - 44 U/L   Alkaline Phosphatase 118 38 - 126 U/L   Total Bilirubin 0.5 <1.2 mg/dL   GFR, Estimated 8 (L) >60 mL/min    Comment: (NOTE) Calculated using the CKD-EPI Creatinine Equation (2021)    Anion gap 14 5 - 15    Comment: Performed at [facility], [address], [place], [place] [address]  I-stat chem 8, ED (not at [facility], [facility] or [facility])     Status: Abnormal   Collection Time: 01/23/23 11:06 PM  Result Value Ref Range   Sodium 137 135 - 145 mmol/L   Potassium 3.8 3.5 - 5.1 mmol/L   Chloride  113 (H) 98 - 111 mmol/L   BUN 82 (H) 8 - 23 mg/dL   Creatinine, Ser 4.01 (H) 0.44 - 1.00 mg/dL   Glucose, Bld 98 70 - 99 mg/dL    Comment: Glucose reference range applies only to samples taken after fasting for at least 8 hours.   Calcium, Ion 1.11 (L) 1.15 - 1.40 mmol/L   TCO2 14 (L) 22 - 32 mmol/L   Hemoglobin 5.4 (LL) 12.0 - 15.0 g/dL   HCT 02.7 (L) 25.3 - 66.4 %   Comment NOTIFIED PHYSICIAN   POC occult blood, ED Provider will collect     Status: None   Collection Time: 01/23/23 11:44 PM  Result Value Ref Range   Fecal Occult Bld NEGATIVE NEGATIVE  Type and screen     Status: None (Preliminary result)   Collection Time: 01/23/23 11:50 PM  Result Value Ref Range   ABO/RH(D) A POS    Antibody Screen NEG    Sample Expiration 01/26/2023,2359    Unit Number [id number]    Blood Component Type RBC LR PHER2    Unit division 00    Status of Unit ISSUED    Transfusion Status OK TO TRANSFUSE    Crossmatch Result Compatible    Unit Number [id number]    Blood Component Type RED CELLS,LR    Unit division 00    Status of Unit ISSUED    Transfusion Status OK TO TRANSFUSE    Crossmatch Result      Compatible Performed at [facility], [address], [place], [place] [address]    Unit Number [id number]    Blood Component  Type RED CELLS,LR    Unit division 00    Status of Unit ALLOCATED    Transfusion Status OK TO TRANSFUSE    Crossmatch Result Compatible    Unit Number [id number]    Blood Component Type RBC LR PHER2    Unit division 00    Status of Unit ALLOCATED    Transfusion Status OK TO TRANSFUSE    Crossmatch Result Compatible   Folate     Status: None   Collection Time: 01/23/23 11:50 PM  Result Value Ref Range   Folate 21.4 >5.9 ng/mL    Comment: Performed at [facility], [address] [place], [place] [address]  Iron and TIBC     Status: Abnormal   Collection Time: 01/23/23 11:50 PM  Result Value Ref Range   Iron 86 28 - 170 ug/dL   TIBC  440 347 - 425 ug/dL   Saturation Ratios 32 (H) 10.4 - 31.8 %   UIBC 183 ug/dL    Comment: Performed at [facility], [address], [place], [place] [address]  Ferritin     Status: Abnormal   Collection Time: 01/23/23 11:50 PM  Result Value Ref Range   Ferritin 488 (H) 11 - 307 ng/mL    Comment: Performed at [facility], [address], [place], [place] [address]  Reticulocytes     Status: Abnormal   Collection Time: 01/23/23 11:50 PM  Result Value Ref Range   Retic Ct Pct 5.8 (H) 0.4 - 3.1 %   RBC. 1.48 (L) 3.87 - 5.11 MIL/uL   Retic Count, Absolute 85.8 19.0 - 186.0 K/uL   Immature Retic Fract 38.7 (H) 2.3 - 15.9 %    Comment: Performed at [facility], [address] [place], [place] [address]  Vitamin B12     Status: None   Collection Time: 01/23/23 11:50 PM  Result Value Ref Range   Vitamin B-12 715 180 - 914 pg/mL    Comment: (NOTE) This assay is not validated for testing neonatal or myeloproliferative syndrome specimens for Vitamin B12 levels. Performed at [facility], [address] [place], [place] [address]   HIV Antibody (routine testing w rflx)     Status: None   Collection Time: 01/23/23 11:50 PM  Result Value Ref Range   HIV Screen 4th Generation wRfx Non Reactive Non Reactive    Comment: Performed at [facility], [address] [place], [place] [address]  Prepare RBC (crossmatch)     Status: None   Collection Time: 01/24/23 12:36 AM  Result Value Ref Range   Order Confirmation      ORDER PROCESSED BY BLOOD BANK Performed at [facility], [address], [place], [place] [address]   Prepare RBC (crossmatch)     Status: None   Collection Time: 01/24/23  1:02 AM  Result Value Ref Range   Order Confirmation      ORDER PROCESSED BY BLOOD BANK Performed at [facility], [address], [place], [place] [address]   Comprehensive metabolic panel     Status: Abnormal   Collection Time: 01/24/23  7:39 AM  Result  Value Ref Range   Sodium 137 135 - 145 mmol/L   Potassium 4.2 3.5 - 5.1 mmol/L   Chloride 111 98 - 111 mmol/L   CO2 11 (L) 22 - 32 mmol/L   Glucose, Bld 172 (H) 70 - 99 mg/dL  Comment: Glucose reference range applies only to samples taken after fasting for at least 8 hours.   BUN 67 (H) 8 - 23 mg/dL   Creatinine, Ser 4.78 (H) 0.44 - 1.00 mg/dL   Calcium 7.6 (L) 8.9 - 10.3 mg/dL   Total Protein 6.5 6.5 - 8.1 g/dL   Albumin 2.7 (L) 3.5 - 5.0 g/dL   AST 26 15 - 41 U/L   ALT 21 0 - 44 U/L   Alkaline Phosphatase 116 38 - 126 U/L   Total Bilirubin 0.8 <1.2 mg/dL   GFR, Estimated 8 (L) >60 mL/min    Comment: (NOTE) Calculated using the CKD-EPI Creatinine Equation (2021)    Anion gap 15 5 - 15    Comment: Performed at [facility], [address], [place], [place] [address]  Hemoglobin and hematocrit, blood     Status: Abnormal   Collection Time: 01/24/23  7:40 AM  Result Value Ref Range   Hemoglobin 8.4 (L) 12.0 - 15.0 g/dL    Comment: REPEATED TO VERIFY POST TRANSFUSION SPECIMEN    HCT 26.3 (L) 36.0 - 46.0 %    Comment: Performed at [facility], [address], [place], [place] [address]   DG Chest Port 1 View  Result Date: 01/24/2023 CLINICAL DATA:  Shortness of breath, cough EXAM: PORTABLE CHEST 1 VIEW COMPARISON:  11/21/2022 FINDINGS: Right dialysis catheter tip at the cavoatrial junction, unchanged. Heart is normal size. Mediastinal contours within normal limits. Right pleural effusion and right lower lobe atelectasis or infiltrate. Small left pleural effusion with left base atelectasis. Diffuse interstitial prominence, favor interstitial edema. IMPRESSION: Bilateral effusions and airspace opacities, right greater than left. Diffuse interstitial prominence, likely edema. Electronically Signed   By: Charlett Nose M.D.   On: 01/24/2023 00:22    Pending Labs Unresulted Labs (From admission, onward)     Start     Ordered   01/25/23 0500  CBC  Daily,   R      01/24/23  0120   01/24/23 1106  Hepatitis B surface antigen  (New Admission Hemo Labs (Hepatitis B))  Add-on,   AD        01/24/23 1105   01/24/23 1106  Hepatitis B surface antibody,quantitative  (New Admission Hemo Labs (Hepatitis B))  Add-on,   AD        01/24/23 1105   01/24/23 0800  Hemoglobin and hematocrit, blood  4 times daily,   R (with TIMED occurrences)      01/24/23 0109   01/24/23 0500  Comprehensive metabolic panel  Daily,   R      01/24/23 0120            Vitals/Pain Today's Vitals   01/24/23 0900 01/24/23 0905 01/24/23 0925 01/24/23 0959  BP: (!) 158/71   (!) 165/82  Pulse: 78   89  Resp: (!) 24   19  Temp:   98.2 F (36.8 C)   TempSrc:   Oral   SpO2: 100%   95%  Weight:      Height:      PainSc:  Asleep      Isolation Precautions No active isolations  Medications Medications  0.9 %  sodium chloride infusion (Manually program via Guardrails IV Fluids) (0 mLs Intravenous Hold 01/24/23 0114)  atorvastatin (LIPITOR) tablet 10 mg (10 mg Oral Given 01/24/23 0951)  busPIRone (BUSPAR) tablet 10 mg (10 mg Oral Given 01/24/23 0951)  hydrOXYzine (ATARAX) tablet 25 mg (has no administration  in time range)  traZODone (DESYREL) tablet 50 mg (has no administration in time range)  sodium bicarbonate tablet 650 mg (650 mg Oral Given 01/24/23 0952)  ferrous sulfate tablet 325 mg (325 mg Oral Given 01/24/23 0745)  acetaminophen (TYLENOL) tablet 650 mg (650 mg Oral Given 01/24/23 0745)    Or  acetaminophen (TYLENOL) suppository 650 mg ( Rectal See Alternative 01/24/23 0745)  senna-docusate (Senokot-S) tablet 1 tablet (has no administration in time range)  ondansetron (ZOFRAN) tablet 4 mg (has no administration in time range)    Or  ondansetron (ZOFRAN) injection 4 mg (has no administration in time range)  hydrALAZINE (APRESOLINE) injection 10 mg (has no administration in time range)  ipratropium-albuterol (DUONEB) 0.5-2.5 (3) MG/3ML nebulizer solution 3 mL (3 mLs Nebulization Given  01/24/23 0958)  guaiFENesin (MUCINEX) 12 hr tablet 600 mg (600 mg Oral Given 01/24/23 0952)  pantoprazole (PROTONIX) EC tablet 40 mg (40 mg Oral Given 01/24/23 0130)  folic acid (FOLVITE) tablet 1 mg (1 mg Oral Given 01/24/23 0952)  vitamin B-12 (CYANOCOBALAMIN) tablet 100 mcg (100 mcg Oral Given 01/24/23 0952)  furosemide (LASIX) injection 40 mg (40 mg Intravenous Given 01/24/23 0955)  gabapentin (NEURONTIN) capsule 100 mg (100 mg Oral Given 01/24/23 1230)  Chlorhexidine Gluconate Cloth 2 % PADS 6 each (has no administration in time range)  0.9 %  sodium chloride infusion (Manually program via Guardrails IV Fluids) (0 mLs Intravenous Stopped 01/24/23 0501)    Mobility walks     Focused Assessments Pulmonary Assessment Handoff:  Lung sounds: Bilateral Breath Sounds: Pleural rub, Rhonchi O2 Device: Nasal Cannula O2 Flow Rate (L/min): 2 L/min    R Recommendations: See Admitting Provider Note  Report given to:   Additional Notes: patient is on oxygen, she said she does not wear oxygen

## 2023-01-24 NOTE — Progress Notes (Signed)
Heart Failure Navigator Progress Note  Assessed for Heart & Vascular TOC clinic readiness.  Patient does not meet criteria due ESRD on HD.   Sharen Hones, PharmD, BCPS Heart Failure Stewardship Pharmacist Phone [phone]

## 2023-01-24 NOTE — Procedures (Signed)
Patient seen on Hemodialysis. BP (!) 168/80 (BP Location: Left Arm)   Pulse 84   Temp 98.2 F (36.8 C) (Oral)   Resp 20   Ht 5\' 7"  (1.702 m)   Wt 53.1 kg   SpO2 100%   BMI 18.33 kg/m   QB 200, UF goal 4L Tolerating treatment without complaints at this time.   Zetta Bills MD [facility]. Office # [phone] Pager # [phone] 4:19 PM

## 2023-01-24 NOTE — Progress Notes (Signed)
   01/24/23 1745  Vitals  Temp 97.8 F (36.6 C)  Pulse Rate 95  Resp 20  BP (!) 176/85  SpO2 100 %  Post Treatment  Dialyzer Clearance Lightly streaked  Hemodialysis Intake (mL) 0 mL  Liters Processed 15.8  Fluid Removed (mL) 600 mL  Tolerated HD Treatment Yes  AVG/AVF Arterial Site Held (minutes) 5 minutes  AVG/AVF Venous Site Held (minutes) 5 minutes   Received patient in bed to unit.  Alert and oriented.  Informed consent signed and in chart.   TX duration:1hr 19 mins due to access issues  Patient tolerated well.  Transported back to the room  Alert, without acute distress.  Hand-off given to patient's nurse.   Access used: [facility] / [facility] Access issues: access not functioning properly  Total UF removed: Medication(s) given: none    [name]

## 2023-01-24 NOTE — Progress Notes (Signed)
Patient has not arrived to room. Patient went from ed to hemo.

## 2023-01-24 NOTE — Progress Notes (Signed)
PROGRESS NOTE    [name]  JWJ:[id number] DOB: 1960/11/05 DOA: 01/23/2023 PCP: [name], MD  62/F with COPD, diastolic CHF, ESRD on hemodialysis, polysubstance abuse, homelessness and poor compliance with outpatient dialysis presented to the ED with increasing shortness of breath leg swelling and cough.   -Still homeless, has been boucing around between [place], [place] In the ED noted to be volume overloaded, hemoglobin down to 4.9 with chest x-ray noting pulmonary edema and effusions   Subjective: Patient seen on dialysis  Assessment and Plan:  Pulmonary edema, volume overload ESRD with noncompliance -Nephrology consulting, HD today  Homelessness With significant Food security issues too -Without stable housing she cannot have reliable transportation to Dialysis -same issues as last time -Social work consult  Acute on chronic diastolic CHF -ESRD, volume managed with HD  COPD -Continue Mucinex, DuoNebs PRN  Macrocytic anemia -Multifactorial, she denies overt blood loss -Suspect combination of chronic disease, hemodilution from volume overload -Transfusing 2 units PRBC, monitor hemoglobin  Hypertension -BP elevated, anticipate improvement with HD  Generalized anxiety disorder Continue BuSpar, trazodone  Secondary hyperparathyroidism  DVT prophylaxis:SCDs Code Status: Full code Family Communication: None present Disposition Plan: TBD  Consultants: Renal   Procedures:   Antimicrobials:    Objective: Vitals:   01/24/23 1100 01/24/23 1200 01/24/23 1235 01/24/23 1300  BP: (!) 156/75 (!) 143/65  (!) 154/71  Pulse: 81 85  70  Resp: 12 14  16   Temp:   97.8 F (36.6 C)   TempSrc:   Oral   SpO2: 100% 100%  (!) 62%  Weight:      Height:        Intake/Output Summary (Last 24 hours) at 01/24/2023 1339 Last data filed at 01/24/2023 0827 Gross per 24 hour  Intake 843.99 ml  Output 400 ml  Net 443.99 ml   Filed  Weights   01/23/23 2250  Weight: 53.1 kg    Examination:  Ill-appearing female, cachectic, AAOx3 HEENT: Positive JVD, right IJ dialysis catheter CVS: S1-S2, regular rhythm Lungs: Decreased breath sounds at the bases, few Rales and rhonchi Abdomen: Soft, nontender Extremities: No edema   Data Reviewed:   CBC: Recent Labs  Lab 01/23/23 2256 01/23/23 2306 01/24/23 0740  WBC 5.6  --   --   NEUTROABS 2.9  --   --   HGB 4.9* 5.4* 8.4*  HCT 15.6* 16.0* 26.3*  MCV 104.7*  --   --   PLT 382  --   --    Basic Metabolic Panel: Recent Labs  Lab 01/23/23 2256 01/23/23 2306 01/24/23 0739  NA 136 137 137  K 3.7 3.8 4.2  CL 109 113* 111  CO2 13*  --  11*  GLUCOSE 104* 98 172*  BUN 71* 82* 67*  CREATININE 5.91* 6.30* 5.86*  CALCIUM 7.5*  --  7.6*   GFR: Estimated Creatinine Clearance: 8.3 mL/min (A) (by C-G formula based on SCr of 5.86 mg/dL (H)). Liver Function Tests: Recent Labs  Lab 01/23/23 2256 01/24/23 0739  AST 31 26  ALT 22 21  ALKPHOS 118 116  BILITOT 0.5 0.8  PROT 6.3* 6.5  ALBUMIN 2.8* 2.7*   No results for input(s): "LIPASE", "AMYLASE" in the last 168 hours. No results for input(s): "AMMONIA" in the last 168 hours. Coagulation Profile: No results for input(s): "INR", "PROTIME" in the last 168 hours. Cardiac Enzymes: No results for input(s): "CKTOTAL", "CKMB", "CKMBINDEX", "TROPONINI" in the last 168 hours. BNP (  last 3 results) No results for input(s): "PROBNP" in the last 8760 hours. HbA1C: No results for input(s): "HGBA1C" in the last 72 hours. CBG: No results for input(s): "GLUCAP" in the last 168 hours. Lipid Profile: No results for input(s): "CHOL", "HDL", "LDLCALC", "TRIG", "CHOLHDL", "LDLDIRECT" in the last 72 hours. Thyroid Function Tests: No results for input(s): "TSH", "T4TOTAL", "FREET4", "T3FREE", "THYROIDAB" in the last 72 hours. Anemia Panel: Recent Labs    01/23/23 2350  VITAMINB12 715  FOLATE 21.4  FERRITIN 488*  TIBC 269   IRON 86  RETICCTPCT 5.8*   Urine analysis:    Component Value Date/Time   COLORURINE YELLOW 10/25/2022 2015   APPEARANCEUR HAZY (A) 10/25/2022 2015   LABSPEC 1.009 10/25/2022 2015   PHURINE 5.0 10/25/2022 2015   GLUCOSEU 50 (A) 10/25/2022 2015   HGBUR LARGE (A) 10/25/2022 2015   BILIRUBINUR NEGATIVE 10/25/2022 2015   KETONESUR NEGATIVE 10/25/2022 2015   PROTEINUR 100 (A) 10/25/2022 2015   UROBILINOGEN 0.2 03/02/2010 2027   NITRITE POSITIVE (A) 10/25/2022 2015   LEUKOCYTESUR MODERATE (A) 10/25/2022 2015   Sepsis Labs: @LABRCNTIP (procalcitonin:4,lacticidven:4)  ) Recent Results (from the past 240 hour(s))  Resp panel by RT-PCR (RSV, Flu A&B, Covid) Anterior Nasal Swab     Status: None   Collection Time: 01/23/23 10:51 PM   Specimen: Anterior Nasal Swab  Result Value Ref Range Status   SARS Coronavirus 2 by RT PCR NEGATIVE NEGATIVE Final   Influenza A by PCR NEGATIVE NEGATIVE Final   Influenza B by PCR NEGATIVE NEGATIVE Final    Comment: (NOTE) The Xpert Xpress SARS-CoV-2/FLU/RSV plus assay is intended as an aid in the diagnosis of influenza from Nasopharyngeal swab specimens and should not be used as a sole basis for treatment. Nasal washings and aspirates are unacceptable for Xpert Xpress SARS-CoV-2/FLU/RSV testing.  Fact Sheet for Patients: BloggerCourse.com  Fact Sheet for Healthcare Providers: SeriousBroker.it  This test is not yet approved or cleared by the [place] FDA and has been authorized for detection and/or diagnosis of SARS-CoV-2 by FDA under an Emergency Use Authorization (EUA). This EUA will remain in effect (meaning this test can be used) for the duration of the COVID-19 declaration under Section 564(b)(1) of the Act, 21 U.S.C. section 360bbb-3(b)(1), unless the authorization is terminated or revoked.     Resp Syncytial Virus by PCR NEGATIVE NEGATIVE Final    Comment: (NOTE) Fact Sheet for  Patients: BloggerCourse.com  Fact Sheet for Healthcare Providers: SeriousBroker.it  This test is not yet approved or cleared by the [place] FDA and has been authorized for detection and/or diagnosis of SARS-CoV-2 by FDA under an Emergency Use Authorization (EUA). This EUA will remain in effect (meaning this test can be used) for the duration of the COVID-19 declaration under Section 564(b)(1) of the Act, 21 U.S.C. section 360bbb-3(b)(1), unless the authorization is terminated or revoked.  Performed at [facility], [address], [place], [place] [address]      Radiology Studies: DG Chest Port 1 View  Result Date: 01/24/2023 CLINICAL DATA:  Shortness of breath, cough EXAM: PORTABLE CHEST 1 VIEW COMPARISON:  11/21/2022 FINDINGS: Right dialysis catheter tip at the cavoatrial junction, unchanged. Heart is normal size. Mediastinal contours within normal limits. Right pleural effusion and right lower lobe atelectasis or infiltrate. Small left pleural effusion with left base atelectasis. Diffuse interstitial prominence, favor interstitial edema. IMPRESSION: Bilateral effusions and airspace opacities, right greater than left. Diffuse interstitial prominence, likely edema. Electronically Signed   By: Caryn Bee  [place] M.D.   On: 01/24/2023 00:22     Scheduled Meds:  sodium chloride   Intravenous Once   atorvastatin  10 mg Oral Daily   busPIRone  10 mg Oral BID   Chlorhexidine Gluconate Cloth  6 each Topical Q0600   ferrous sulfate  325 mg Oral Q breakfast   folic acid  1 mg Oral Daily   furosemide  40 mg Intravenous BID   gabapentin  100 mg Oral TID   guaiFENesin  600 mg Oral BID   pantoprazole  40 mg Oral Daily   sodium bicarbonate  650 mg Oral TID   vitamin B-12  100 mcg Oral Daily   Continuous Infusions:  anticoagulant sodium citrate       LOS: 0 days    Time spent:    Zannie Cove, MD [facility]   01/24/2023, 1:39 PM

## 2023-01-24 NOTE — Plan of Care (Signed)

## 2023-01-24 NOTE — Progress Notes (Signed)
Patient arrived from [facility] from the ed prior to that to room 5. Patient is upset saying her black [place] tennis shoes are missing. Patient requesting security to speak to. Patient placed in the bed oriented to her room and bed and call bell. Patient to not take any meds not ordered by MD. Patient stated she would not take anything other than what ordered. Patient is homeless and has several bags of belongings with her. Patient made aware she is responsible for her belongings.

## 2023-01-24 NOTE — Progress Notes (Addendum)
Pt came in with grocery bag on her dialysis catheter. Catheter care perform dressing and antimicrobial disc applied . Catheter would not pull and the lumens have old blood in them looks like clots. Dr. Allena Katz notified order modified per Dr. Allena Katz to use AVF for dialysis. AVF successfully cannulated with 17g needles x2. BFR at 200 due to high AP. Pt educated to keep arm still due to prevent infiltration on new access.

## 2023-01-24 NOTE — Plan of Care (Signed)
Patient new admit and non compliant with [facility] dialysis

## 2023-01-24 NOTE — Progress Notes (Signed)
Report received from [facility]. Nurse stated patient access did not work well. Th ey removed around and ran about two hours.

## 2023-01-24 NOTE — H&P (Signed)
History and Physical    [name] JXB:[id number] DOB: Feb 01, 1961 DOA: 01/23/2023  PCP: [name], MD   Patient coming from: Unfortunately patient is homeless   Chief Complaint:  Chief Complaint  Patient presents with   Shortness of Breath   ED TRIAGE note:Pt presents to ED 21 via EMS from home with above.  Pt endorses shortness of breath for 3 days with cough.  Per EMS, pt 91% on room air upon arrival.  Pt placed on non-rebreather with improvement to 100%.  Pt also having bilateral leg swelling.   Pt states she has not been to dialysis in months due to "not feeling like it."  Pt given 125 mg Solumedrol en route.  HPI:  [name] is a 62 y.o. female with medical history significant of ESRD on dialysis TTS schedule, chronic diastolic heart failure preserved EF 55 to 60%, essential hypertension, COPD, hyperlipidemia, anemia of chronic disease and generalized anxiety disorder please to the emergency department for evaluation for shortness of breath and worsening bilateral lower extremity swelling.  Patient reported that for last 3 days she has been not feeling well and having nasal congestion and cough.  She also reported congestion in her chest.  Denies any fever and chills.  Upon EMS arrival patient found 91% on room air.  She was given Solu-Medrol and placed on nonrebreather with improvement of oxygen saturation.  Patient has a dialysis catheter on her right chest and she has been chronically noncompliant with dialysis reported she has been not doing dialysis for last several weeks.  Patient reported she has transportation issues and she did not got any help from Child psychotherapist for transportation regarding transport to dialysis.  She is complaining about fluid buildup in her abdomen, and bilateral ankle.  Patient denies any chest pain, palpitation, headache, nausea, vomiting, constipation diarrhea.   ED Course:  At presentation to ED patient is hemodynamically stable except  blood pressure borderline elevated 165/74.  O2 sat 95% on 2 L. Respiratory panel negative for COVID, flu and RSV. CBC showing low hemoglobin 4.9 (baseline hemoglobin around 7-8), hematocrit 50.6 and MCV 104.  Normal WBC count 5.6 and platelet 382. CMP unremarkable except low bicarb 13, BUN 17, creatinine 5.91, low albumin 2.8, low calcium 7.5 and anion gap 14. Fecal occult blood test negative.  Chest x-ray showing bilateral effusion and airspace opacity right greater than left.  Diffuse interstitial prominence likely edema.  In the ED 1 unit blood transfusion has been ordered.  Hospitalist has been consulted for further evaluation and management of volume overload in the context of missing dialysis and acute on chronic development of anemia.  Discussed case with on-call  nephrologist Dr.Bhandari plan for dialysis in the morning.   Review of Systems:  Review of Systems  Constitutional:  Negative for chills, fever, malaise/fatigue and weight loss.  Respiratory:  Positive for cough, sputum production and shortness of breath. Negative for wheezing.   Cardiovascular:  Negative for chest pain and leg swelling.  Gastrointestinal:  Negative for abdominal pain, heartburn and nausea.  Genitourinary:  Negative for dysuria and urgency.  Musculoskeletal:  Negative for back pain, myalgias and neck pain.  Neurological:  Negative for dizziness and headaches.  Psychiatric/Behavioral:  The patient is not nervous/anxious.     Past Medical History:  Diagnosis Date   Alcohol abuse    Allergy    Anxiety    Arthritis    Asthma    Cannabis abuse    Cocaine abuse (  HCC)    COPD (chronic obstructive pulmonary disease) (HCC)    Depression    Drug addiction (HCC)    GERD (gastroesophageal reflux disease)    Heart murmur    Homelessness    Hyperlipidemia    Hypertension    pt stated "every once in a while BP will be high but has not been prescribed medication for HTN.    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) (HCC) 02/22/2016    Past Surgical History:  Procedure Laterality Date   AV FISTULA PLACEMENT Right 08/14/2022   Procedure: RIGHT ARM BRACHIOBASILIC ATERIOVENOUS FISTULA CREATION;  Surgeon: Leonie Douglas, MD;  Location: [facility] OR;  Service: Vascular;  Laterality: Right;   BASCILIC VEIN TRANSPOSITION Right 11/13/2022   Procedure: RIGHT ARM SECOND STAGE BASILIC VEIN TRANSPOSITION;  Surgeon: Leonie Douglas, MD;  Location: [facility] OR;  Service: Vascular;  Laterality: Right;   BIOPSY  01/01/2019   Procedure: BIOPSY;  Surgeon: Beverley Fiedler, MD;  Location: [facility] ENDOSCOPY;  Service: Endoscopy;;   BIOPSY  11/17/2021   Procedure: BIOPSY;  Surgeon: Lynann Bologna, MD;  Location: [facility] ENDOSCOPY;  Service: Gastroenterology;;   CESAREAN SECTION  1989   COLONOSCOPY  11/07/2020   2018   CYSTOSCOPY W/ URETERAL STENT PLACEMENT Left 08/01/2018   Procedure: CYSTOSCOPY WITH RETROGRADE PYELOGRAM/URETERAL STENT PLACEMENT;  Surgeon: Crista Elliot, MD;  Location: [facility] ORS;  Service: Urology;  Laterality: Left;   CYSTOSCOPY WITH RETROGRADE PYELOGRAM, URETEROSCOPY AND STENT PLACEMENT Left 01/15/2019   Procedure: CYSTOSCOPY WITH RETROGRADE PYELOGRAM, URETEROSCOPY AND STENT PLACEMENT;  Surgeon: Crista Elliot, MD;  Location: [facility] ORS;  Service: Urology;  Laterality: Left;   ENTEROSCOPY N/A 08/16/2022   Procedure: ENTEROSCOPY;  Surgeon: Meridee Score Netty Starring., MD;  Location: [facility] ENDOSCOPY;  Service: Gastroenterology;  Laterality: N/A;   ENTEROSCOPY N/A 10/31/2022   Procedure: ENTEROSCOPY;  Surgeon: Beverley Fiedler, MD;  Location: [facility] ENDOSCOPY;  Service: Gastroenterology;  Laterality: N/A;   ESOPHAGOGASTRODUODENOSCOPY (EGD) WITH PROPOFOL N/A 01/01/2019   Procedure: ESOPHAGOGASTRODUODENOSCOPY (EGD) WITH PROPOFOL;  Surgeon: Beverley Fiedler, MD;  Location: [facility] ENDOSCOPY;  Service: Endoscopy;  Laterality:  N/A;   ESOPHAGOGASTRODUODENOSCOPY (EGD) WITH PROPOFOL N/A 08/21/2019   Procedure: ESOPHAGOGASTRODUODENOSCOPY (EGD) WITH PROPOFOL;  Surgeon: Napoleon Form, MD;  Location: [facility] ENDOSCOPY;  Service: Endoscopy;  Laterality: N/A;   ESOPHAGOGASTRODUODENOSCOPY (EGD) WITH PROPOFOL N/A 11/17/2021   Procedure: ESOPHAGOGASTRODUODENOSCOPY (EGD) WITH PROPOFOL;  Surgeon: Lynann Bologna, MD;  Location: [facility] ENDOSCOPY;  Service: Gastroenterology;  Laterality: N/A;   FRACTURE SURGERY Left 2011   arm   HEMOSTASIS CLIP PLACEMENT  08/16/2022   Procedure: HEMOSTASIS CLIP PLACEMENT;  Surgeon: Lemar Lofty., MD;  Location: [facility] ENDOSCOPY;  Service: Gastroenterology;;   HOT HEMOSTASIS N/A 08/16/2022   Procedure: HOT HEMOSTASIS (ARGON PLASMA COAGULATION/BICAP);  Surgeon: Lemar Lofty., MD;  Location: [facility] ENDOSCOPY;  Service: Gastroenterology;  Laterality: N/A;   HOT HEMOSTASIS N/A 10/31/2022   Procedure: HOT HEMOSTASIS (ARGON PLASMA COAGULATION/BICAP);  Surgeon: Beverley Fiedler, MD;  Location: [facility] ENDOSCOPY;  Service: Gastroenterology;  Laterality: N/A;   INSERTION OF DIALYSIS CATHETER Right 08/14/2022   Procedure: INSERTION OF TUNNELED DIALYSIS CATHETER USING PALINDROME CATHETER KIT 19CM;  Surgeon: Leonie Douglas, MD;  Location: [facility] OR;  Service: Vascular;  Laterality: Right;   POLYPECTOMY  08/16/2022   Procedure: POLYPECTOMY;  Surgeon: Lemar Lofty.,  MD;  Location: [facility] ENDOSCOPY;  Service: Gastroenterology;;   SUBMUCOSAL TATTOO INJECTION  08/16/2022   Procedure: SUBMUCOSAL TATTOO INJECTION;  Surgeon: Lemar Lofty., MD;  Location: [facility] ENDOSCOPY;  Service: Gastroenterology;;   UPPER GASTROINTESTINAL ENDOSCOPY       reports that she has been smoking cigarettes. She has been exposed to tobacco smoke. She has never used smokeless tobacco. She reports that she does not currently use alcohol. She reports current drug use. Drug: Marijuana.  Allergies  Allergen Reactions   Zestril [Lisinopril]  Other (See Comments)    Hyperkalemia    Family History  Problem Relation Age of Onset   Diabetes Father    Colon cancer Neg Hx    Esophageal cancer Neg Hx    Rectal cancer Neg Hx    Stomach cancer Neg Hx     Prior to Admission medications   Medication Sig Start Date End Date Taking? Authorizing Provider  acetaminophen (TYLENOL) 500 MG tablet Take 1,000 mg by mouth 2 (two) times daily as needed for moderate pain, fever or headache.    [provider]  albuterol (VENTOLIN HFA) 108 (90 Base) MCG/ACT inhaler Inhale 2 puffs into the lungs every 6 (six) hours as needed for wheezing or shortness of breath. 11/02/22   Almon Hercules, MD  atorvastatin (LIPITOR) 10 MG tablet Take 1 tablet (10 mg total) by mouth daily. 11/03/22   Almon Hercules, MD  busPIRone (BUSPAR) 10 MG tablet Take 1 tablet (10 mg total) by mouth 2 (two) times daily. 11/02/22   Almon Hercules, MD  ferrous sulfate 325 (65 FE) MG tablet Take 1 tablet (325 mg total) by mouth daily with breakfast. 11/02/22   Almon Hercules, MD  Fluticasone-Umeclidin-Vilant (TRELEGY ELLIPTA) 100-62.5-25 MCG/ACT AEPB Inhale 1 puff into the lungs daily. 11/02/22   Almon Hercules, MD  furosemide (LASIX) 80 MG tablet Take 1 tablet (80 mg total) by mouth every Monday, Wednesday, and Friday. 11/04/22 11/04/23  Almon Hercules, MD  gabapentin (NEURONTIN) 100 MG capsule Take 1 capsule (100 mg total) by mouth 3 (three) times daily. 11/02/22   Almon Hercules, MD  hydrOXYzine (ATARAX) 25 MG tablet Take 1 tablet (25 mg total) by mouth 3 (three) times daily as needed for anxiety or itching. 11/02/22   Almon Hercules, MD  ipratropium-albuterol (DUONEB) 0.5-2.5 (3) MG/3ML SOLN Take 3 mLs by nebulization every 6 (six) hours as needed (shortness of breath and wheezing). Patient taking differently: Take 3 mLs by nebulization every 6 (six) hours as needed (shortness of breath, wheezing). 01/21/22   Claiborne Rigg, NP  multivitamin (RENA-VIT) TABS tablet Take 1 tablet by mouth at  bedtime. 11/02/22   Almon Hercules, MD  oxyCODONE-acetaminophen (PERCOCET/ROXICET) 5-325 MG tablet Take 1 tablet by mouth every 8 (eight) hours as needed for severe pain or moderate pain. 08/05/22   Lorin Glass, MD  pantoprazole (PROTONIX) 40 MG tablet Take 1 tablet (40 mg total) by mouth 2 (two) times daily for 30 days, THEN 1 tablet (40 mg total) daily. 11/02/22 01/31/23  Almon Hercules, MD  sodium bicarbonate 650 MG tablet Take 1 tablet (650 mg total) by mouth 3 (three) times daily. 11/02/22   Almon Hercules, MD  traZODone (DESYREL) 50 MG tablet Take 1 tablet (50 mg total) by mouth at bedtime as needed for sleep. 11/02/22   Almon Hercules, MD     Physical Exam: Vitals:   01/24/23 0045 01/24/23 0047 01/24/23 0100 01/24/23 0120  BP: (!) 165/74 (!) 165/74 (!) 162/77 (!) 153/77  Pulse: 93 93 89 88  Resp: 20 20 19 20   Temp:  99.1 F (37.3 C)  99.2 F (37.3 C)  TempSrc:  Oral  Oral  SpO2: 93% 95% 95% 96%  Weight:      Height:        Physical Exam Constitutional:      General: She is not in acute distress.    Appearance: She is ill-appearing.  Cardiovascular:     Rate and Rhythm: Normal rate and regular rhythm.  Pulmonary:     Effort: Pulmonary effort is normal.     Breath sounds: Normal breath sounds. No decreased breath sounds, wheezing, rhonchi or rales.  Musculoskeletal:     Cervical back: Neck supple.     Right lower leg: No edema.  Skin:    General: Skin is dry.     Capillary Refill: Capillary refill takes less than 2 seconds.  Neurological:     Mental Status: She is alert and oriented to person, place, and time.  Psychiatric:        Mood and Affect: Mood normal.      Labs on Admission: I have personally reviewed following labs and imaging studies  CBC: Recent Labs  Lab 01/23/23 2256 01/23/23 2306  WBC 5.6  --   NEUTROABS 2.9  --   HGB 4.9* 5.4*  HCT 15.6* 16.0*  MCV 104.7*  --   PLT 382  --    Basic Metabolic Panel: Recent Labs  Lab 01/23/23 2256  01/23/23 2306  NA 136 137  K 3.7 3.8  CL 109 113*  CO2 13*  --   GLUCOSE 104* 98  BUN 71* 82*  CREATININE 5.91* 6.30*  CALCIUM 7.5*  --    GFR: Estimated Creatinine Clearance: 7.8 mL/min (A) (by C-G formula based on SCr of 6.3 mg/dL (H)). Liver Function Tests: Recent Labs  Lab 01/23/23 2256  AST 31  ALT 22  ALKPHOS 118  BILITOT 0.5  PROT 6.3*  ALBUMIN 2.8*   No results for input(s): "LIPASE", "AMYLASE" in the last 168 hours. No results for input(s): "AMMONIA" in the last 168 hours. Coagulation Profile: No results for input(s): "INR", "PROTIME" in the last 168 hours. Cardiac Enzymes: No results for input(s): "CKTOTAL", "CKMB", "CKMBINDEX", "TROPONINI", "TROPONINIHS" in the last 168 hours. BNP (last 3 results) Recent Labs    08/12/22 1710 11/09/22 2126  BNP 1,084.9* 383.3*   HbA1C: No results for input(s): "HGBA1C" in the last 72 hours. CBG: No results for input(s): "GLUCAP" in the last 168 hours. Lipid Profile: No results for input(s): "CHOL", "HDL", "LDLCALC", "TRIG", "CHOLHDL", "LDLDIRECT" in the last 72 hours. Thyroid Function Tests: No results for input(s): "TSH", "T4TOTAL", "FREET4", "T3FREE", "THYROIDAB" in the last 72 hours. Anemia Panel: Recent Labs    01/23/23 2350  VITAMINB12 715  FOLATE 21.4  FERRITIN 488*  TIBC 269  IRON 86  RETICCTPCT 5.8*   Urine analysis:    Component Value Date/Time   COLORURINE YELLOW 10/25/2022 2015   APPEARANCEUR HAZY (A) 10/25/2022 2015   LABSPEC 1.009 10/25/2022 2015   PHURINE 5.0 10/25/2022 2015   GLUCOSEU 50 (A) 10/25/2022 2015   HGBUR LARGE (A) 10/25/2022 2015   BILIRUBINUR NEGATIVE 10/25/2022 2015   KETONESUR NEGATIVE 10/25/2022 2015   PROTEINUR 100 (A) 10/25/2022 2015   UROBILINOGEN 0.2 03/02/2010 2027   NITRITE POSITIVE (A) 10/25/2022 2015   LEUKOCYTESUR MODERATE (A) 10/25/2022 2015    Radiological Exams on  Admission: I have personally reviewed images DG Chest Port 1 View  Result Date:  01/24/2023 CLINICAL DATA:  Shortness of breath, cough EXAM: PORTABLE CHEST 1 VIEW COMPARISON:  11/21/2022 FINDINGS: Right dialysis catheter tip at the cavoatrial junction, unchanged. Heart is normal size. Mediastinal contours within normal limits. Right pleural effusion and right lower lobe atelectasis or infiltrate. Small left pleural effusion with left base atelectasis. Diffuse interstitial prominence, favor interstitial edema. IMPRESSION: Bilateral effusions and airspace opacities, right greater than left. Diffuse interstitial prominence, likely edema. Electronically Signed   By: Charlett Nose M.D.   On: 01/24/2023 00:22    EKG: My personal interpretation of EKG shows: EKG showing sinus rhythm heart rate 89.    Assessment/Plan: Principal Problem:   Acute on chronic anemia Active Problems:   Chronic diastolic CHF (congestive heart failure) (HCC)   CHF exacerbation (HCC)   ESRD on dialysis (HCC)   Volume overload   Noncompliance with dialysis   Essential hypertension   History of COPD   Hyperlipidemia   Generalized anxiety disorder    Assessment and Plan: Acute on chronic anemia Anemia of chronic disease Macrocytic anemia -Patient presenting with complaining of chest congestion, feeling sensation of volume overload in her abdomen and bilateral lower extremity swelling. -CBC showing hemoglobin 4.9, hematocrit 15 and MCV 104.  Patient denies any brown or black tarry stool.  Reported she develops anemia time to time and get blood transfusion on 05/07/2018 come to the hospital.  Patient has baseline hemoglobin between 7-8. -Fecal occult blood test negative. -Low suspicion of GI bleed at this time.  Development of acute anemia in the setting of chronic kidney disease/ESRD.  Patient will benefit from Epogen shot in the [place]-term. - Endoscopy from 8/24 showed a single nonbleeding angioectasia of the stoma treated with argon plasma, chronic gastritis and nonbleeding angioectasia in the jejunum  and duodenum..  Recommended daily PPI. -In the ED to monitor blood transfusion has been ordered. - Continue to monitor H&H 4 times daily and transfuse as needed to keep hemoglobin above 7. -Pending anemia panel. - Continue oral iron supplement, folic acid and vitamin B12 supplement. - Admitting patient to progressive unit.   Diastolic heart failure exacerbation History of diastolic heart failure preserved EF 60 to 65% Volume overloaded-due to missing dialysis - At presentation to ED patient found hemodynamically stable O2 sat 96% on 2 L.  EMS reported they found O2 sat 91% room air.  In the ED patient has been treated with Solu-Medrol.  Physical exam did not showed any bilateral lower extremity swelling and wheezing.  Low suspicion of COPD exacerbation at this time. - X-ray showing bilateral pleural effusion airspace opacities right greater than the left and diffuse interstitial prominence likely edema. - Patient reported she does take Lasix 80 mg on Monday Wednesday Friday. - Concern for CHF exacerbation in the context of noncompliance to dialysis. - Continue IV Lasix 40 mg twice daily. - Continue to monitor urine output, strict I's/O and daily weight. - Continue renal diet. -Echocardiogram. -Consulted nephrology plan for dialysis earlier in the morning.  Appreciate nephrology input.    ESRD on HD-noncompliant with dialysis Overloaded due to missing dialysis for last 2 months -Patient reported last dialysis in September 2024.  Reported having transportation issues and she is homeless requesting for coordination of transportation to dialysis unit. - CMP showing normal potassium 3.7, elevated BUN 71, elevated creatinine 5.81 and GFR 8. -Consulted nephrology Dr. Ronalee Belts, plan for dialysis in the morning. -Continue sodium bicarb  650 mg 3 times daily.  Essential hypertension -At home patient is not any antihypertensive regimen except Lasix 80 mg Monday, Wednesday Friday.  Given patient  has history of diastolic heart failure spironolactone would be beneficial however she is also ESRD and noncompliant with dialysis and high risk of development of hyperkalemia as well. - Currently treating with IV diuretics for CHF exacerbation. - On discharge will transition to oral Lasix. -Need to establish care with outpatient primary care provider.  History of COPD Cough -Patient complaining about chest congestion, is of breath and cough.  Denies any wheezing.  Respiratory panel negative for COVID, flu and RSV. - EMS reported O2 sat 91% at room air.  And route to ED patient received Solu-Medrol 1 dose. - Low suspicion of COPD exacerbation at this time. - Continue Mucinex twice daily, DuoNeb every 6 hour as needed for wheezing shortness of breath and continue supplemental oxygen to keep O2 sat above 92%.  Hyperlipidemia -Continue Lipitor.  Generalized anxiety disorder -Continue BuSpar Insomnia - Continue trazodone   DVT prophylaxis:  SCDs Code Status:  Full Code Diet: Renal diet Family Communication: No family member at bedside now Disposition Plan: Pending improvement of hemoglobin and volume status.  Pending clinical disposition. Consults: Nephrology and transition care team Admission status:   Inpatient, Step Down Unit  Severity of Illness: The appropriate patient status for this patient is INPATIENT. Inpatient status is judged to be reasonable and necessary in order to provide the required intensity of service to ensure the patient's safety. The patient's presenting symptoms, physical exam findings, and initial radiographic and laboratory data in the context of their chronic comorbidities is felt to place them at high risk for further clinical deterioration. Furthermore, it is not anticipated that the patient will be medically stable for discharge from the hospital within 2 midnights of admission.   * I certify that at the point of admission it is my clinical judgment that the  patient will require inpatient hospital care spanning beyond 2 midnights from the point of admission due to high intensity of service, high risk for further deterioration and high frequency of surveillance required.Marland Kitchen    Tereasa Coop, MD [facility]  How to contact the [facility] Attending or Consulting provider 7A - 7P or covering provider during after hours 7P -7A, for this patient.  Check the care team in [facility] and look for a) attending/consulting TRH provider listed and b) the [facility] team listed Log into [email or web address] and use [place]'s universal password to access. If you do not have the password, please contact the hospital operator. Locate the [facility] provider you are looking for under [facility] and page to a number that you can be directly reached. If you still have difficulty reaching the provider, please page the [facility] (Director on Call) for the Hospitalists listed on amion for assistance.  01/24/2023, 1:35 AM

## 2023-01-25 ENCOUNTER — Inpatient Hospital Stay (HOSPITAL_COMMUNITY): Payer: Medicare Other

## 2023-01-25 DIAGNOSIS — I5032 Chronic diastolic (congestive) heart failure: Secondary | ICD-10-CM

## 2023-01-25 DIAGNOSIS — D649 Anemia, unspecified: Secondary | ICD-10-CM | POA: Diagnosis not present

## 2023-01-25 DIAGNOSIS — Z8709 Personal history of other diseases of the respiratory system: Secondary | ICD-10-CM | POA: Diagnosis not present

## 2023-01-25 DIAGNOSIS — I10 Essential (primary) hypertension: Secondary | ICD-10-CM | POA: Diagnosis not present

## 2023-01-25 LAB — COMPREHENSIVE METABOLIC PANEL
ALT: 20 U/L (ref 0–44)
AST: 24 U/L (ref 15–41)
Albumin: 2.4 g/dL — ABNORMAL LOW (ref 3.5–5.0)
Alkaline Phosphatase: 99 U/L (ref 38–126)
Anion gap: 10 (ref 5–15)
BUN: 50 mg/dL — ABNORMAL HIGH (ref 8–23)
CO2: 19 mmol/L — ABNORMAL LOW (ref 22–32)
Calcium: 7.9 mg/dL — ABNORMAL LOW (ref 8.9–10.3)
Chloride: 108 mmol/L (ref 98–111)
Creatinine, Ser: 4.59 mg/dL — ABNORMAL HIGH (ref 0.44–1.00)
GFR, Estimated: 10 mL/min — ABNORMAL LOW (ref >60)
Glucose, Bld: 95 mg/dL (ref 70–99)
Potassium: 2.5 mmol/L — CL (ref 3.5–5.1)
Sodium: 137 mmol/L (ref 135–145)
Total Bilirubin: 0.7 mg/dL (ref <1.2)
Total Protein: 5.8 g/dL — ABNORMAL LOW (ref 6.5–8.1)

## 2023-01-25 LAB — CBC
HCT: 24 % — ABNORMAL LOW (ref 36.0–46.0)
Hemoglobin: 8 g/dL — ABNORMAL LOW (ref 12.0–15.0)
MCH: 30.2 pg (ref 26.0–34.0)
MCHC: 33.3 g/dL (ref 30.0–36.0)
MCV: 90.6 fL (ref 80.0–100.0)
Platelets: 352 10*3/uL (ref 150–400)
RBC: 2.65 MIL/uL — ABNORMAL LOW (ref 3.87–5.11)
RDW: 22.5 % — ABNORMAL HIGH (ref 11.5–15.5)
WBC: 7.7 10*3/uL (ref 4.0–10.5)
nRBC: 0.5 % — ABNORMAL HIGH (ref 0.0–0.2)

## 2023-01-25 LAB — ECHOCARDIOGRAM LIMITED
Height: 67 in
S' Lateral: 3.9 cm
Weight: 1873.03 [oz_av]

## 2023-01-25 LAB — HEPATITIS B SURFACE ANTIBODY, QUANTITATIVE: Hep B S AB Quant (Post): <3.5 m[IU]/mL — ABNORMAL LOW

## 2023-01-25 LAB — PHOSPHORUS: Phosphorus: 4.5 mg/dL (ref 2.5–4.6)

## 2023-01-25 MED ORDER — CHLORHEXIDINE GLUCONATE CLOTH 2 % EX PADS
6.0000 | MEDICATED_PAD | Freq: Every day | CUTANEOUS | Status: DC
  Administered 2023-01-25 – 2023-01-28 (×4): 6 via TOPICAL

## 2023-01-25 MED ORDER — DARBEPOETIN ALFA 60 MCG/0.3ML IJ SOSY
60.0000 ug | PREFILLED_SYRINGE | INTRAMUSCULAR | Status: DC
  Administered 2023-01-25: 60 ug via SUBCUTANEOUS
  Filled 2023-01-25 (×2): qty 0.3

## 2023-01-25 MED ORDER — POTASSIUM CHLORIDE CRYS ER 20 MEQ PO TBCR
40.0000 meq | EXTENDED_RELEASE_TABLET | Freq: Once | ORAL | Status: AC
  Administered 2023-01-25: 40 meq via ORAL
  Filled 2023-01-25: qty 2

## 2023-01-25 MED ORDER — POTASSIUM CHLORIDE CRYS ER 20 MEQ PO TBCR
20.0000 meq | EXTENDED_RELEASE_TABLET | Freq: Once | ORAL | Status: AC
  Administered 2023-01-25: 20 meq via ORAL
  Filled 2023-01-25: qty 1

## 2023-01-25 MED ORDER — FUROSEMIDE 10 MG/ML IJ SOLN
80.0000 mg | Freq: Two times a day (BID) | INTRAMUSCULAR | Status: DC
  Administered 2023-01-25 – 2023-01-26 (×3): 80 mg via INTRAVENOUS
  Filled 2023-01-25 (×3): qty 8

## 2023-01-25 NOTE — Progress Notes (Signed)
[facility] Progress Note   Subjective:   Patient seen and examined at bedside.  Reports pain with using AVF yesterday and alarming a lot resulting in early termination of treatment.  TDC was not able to push or pull.  Continues to have some pain in AVF today but no bruising noted.  Does not appear to have infiltrated.  Denies CP, SOB, abdominal pain and n/v/d. Tearful when discussing living situation.  Reports all her friends and most of her family has passed and she has no where to go.   Objective Vitals:   01/24/23 2307 01/25/23 0000 01/25/23 0400 01/25/23 0600  BP: (!) 166/78 (!) 169/72 (!) 157/83   Pulse: 86 80 83 73  Resp: 18 14 18 12   Temp: 98.8 F (37.1 C)  (!) 97.5 F (36.4 C)   TempSrc: Oral  Oral   SpO2: 97% 96% 96% 96%  Weight:      Height:       Physical Exam General:chronically ill appearing female in NAD Heart:RRR, no mrg Lungs:+scattered wheezing, nml WOB on RA Abdomen:soft, NTND Extremities:no LE edema Dialysis Access: [facility], RU AVF +b/t   Filed Weights   01/23/23 2250  Weight: 53.1 kg    Intake/Output Summary (Last 24 hours) at 01/25/2023 1013 Last data filed at 01/25/2023 0900 Gross per 24 hour  Intake 820 ml  Output 600 ml  Net 220 ml    Additional Objective Labs: Basic Metabolic Panel: Recent Labs  Lab 01/23/23 2256 01/23/23 2306 01/24/23 0739 01/25/23 0408  NA 136 137 137 137  K 3.7 3.8 4.2 2.5*  CL 109 113* 111 108  CO2 13*  --  11* 19*  GLUCOSE 104* 98 172* 95  BUN 71* 82* 67* 50*  CREATININE 5.91* 6.30* 5.86* 4.59*  CALCIUM 7.5*  --  7.6* 7.9*   Liver Function Tests: Recent Labs  Lab 01/23/23 2256 01/24/23 0739 01/25/23 0408  AST 31 26 24   ALT 22 21 20   ALKPHOS 118 116 99  BILITOT 0.5 0.8 0.7  PROT 6.3* 6.5 5.8*  ALBUMIN 2.8* 2.7* 2.4*   CBC: Recent Labs  Lab 01/23/23 2256 01/23/23 2306 01/24/23 0740 01/25/23 0408  WBC 5.6  --   --  7.7  NEUTROABS 2.9  --   --   --   HGB 4.9* 5.4* 8.4* 8.0*  HCT  15.6* 16.0* 26.3* 24.0*  MCV 104.7*  --   --  90.6  PLT 382  --   --  352   Iron Studies:  Recent Labs    01/23/23 2350  IRON 86  TIBC 269  FERRITIN 488*   Lab Results  Component Value Date   INR 1.2 11/10/2022   INR 1.1 08/12/2022   INR 1.0 07/31/2018   Studies/Results: DG Chest Port 1 View  Result Date: 01/24/2023 CLINICAL DATA:  Shortness of breath, cough EXAM: PORTABLE CHEST 1 VIEW COMPARISON:  11/21/2022 FINDINGS: Right dialysis catheter tip at the cavoatrial junction, unchanged. Heart is normal size. Mediastinal contours within normal limits. Right pleural effusion and right lower lobe atelectasis or infiltrate. Small left pleural effusion with left base atelectasis. Diffuse interstitial prominence, favor interstitial edema. IMPRESSION: Bilateral effusions and airspace opacities, right greater than left. Diffuse interstitial prominence, likely edema. Electronically Signed   By: Charlett Nose M.D.   On: 01/24/2023 00:22    Medications:   atorvastatin  10 mg Oral Daily   busPIRone  10 mg Oral BID   Chlorhexidine Gluconate Cloth  6 each  Topical Q0600   Chlorhexidine Gluconate Cloth  6 each Topical Q0600   ferrous sulfate  325 mg Oral Q breakfast   folic acid  1 mg Oral Daily   furosemide  40 mg Intravenous BID   gabapentin  100 mg Oral TID   guaiFENesin  600 mg Oral BID   pantoprazole  40 mg Oral Daily   sodium bicarbonate  650 mg Oral TID   vitamin B-12  100 mcg Oral Daily    Assessment/Plan: 1. Volume overload - pulmonary edema on CXR. Likely 2/2 missed HD. On RA.  Minimal UF with HD yesterday d/t issue with access.  Plan for additional HD today.  2. ESRD - Difficult situation because the patient remains homeless and does not have the ability to get reliable transportation to dialysis. She was accepted to 2 HD centers in [place] but did not attend outpatient treatments at either one. Does not currently have an outpatient center. Will be difficult to place d/t social  situation.  3. Access issue - TDC not working. Difficultly with AVF yesterday but was 1st time using.  Will try again today using smaller needles and lower flow rates.  4. Anemia of CKD- Hgb improved to 8.0 s/p 2 units pRBC. It was 4.9 on admit. Tsat in goal. Will order ESA.  5. Secondary hyperparathyroidism - CCa in goal. Check phos.  Not on binder or VDRA. Check pth. 6. HTN - BP variable. On Lasix.  7. Nutrition - Renal diet w/fluid restrictions.  8. Hyperkalemia - K 2.5 this AM. Supplements already given.   Virgina Norfolk, PA-C [facility] 01/25/2023,10:13 AM  LOS: 1 day

## 2023-01-25 NOTE — Plan of Care (Signed)

## 2023-01-25 NOTE — Progress Notes (Signed)
Patient is refusing hemo today. MD made aware my hemo RN. Patient states her arm is very sore and painful. Swollen mildly and patient states feeling warm. Felt upper arm warm to touch now no redness. Arm bruised.

## 2023-01-25 NOTE — Progress Notes (Signed)
Patient back from echo. Patient c/o of wheezing.

## 2023-01-25 NOTE — Progress Notes (Signed)
Patient to echo via bed.

## 2023-01-25 NOTE — Progress Notes (Addendum)
PROGRESS NOTE        PATIENT DETAILS Name: [name] Age: 62 y.o. Sex: female Date of Birth: 09-05-1960 Admit Date: 01/23/2023 Admitting Physician Tereasa Coop, MD [id number], [name], MD  Brief Summary: Patient is a 62 y.o.  female with history of ESRD on HD-noncompliant due to social/homelessness issues-presenting with shortness of breath-found to have pulmonary edema/volume overload and subsequently admitted to the hospitalist service.  Significant events: 11/7>> admit to [facility]  Significant studies: 11/7>> CXR: Pulmonary edema  Significant microbiology data: 11/7>> COVID/influenza/RSV PCR: Negative  Procedures: None  Consults: Allergy  Subjective: Lying comfortably in bed-denies any chest pain or shortness of breath.  Breathing is much better.  Objective: Vitals: Blood pressure (!) 157/83, pulse 73, temperature (!) 97.5 F (36.4 C), temperature source Oral, resp. rate 12, height 5\' 7"  (1.702 m), weight 53.1 kg, SpO2 96%.   Exam: Gen Exam:Alert awake-not in any distress HEENT:atraumatic, normocephalic Chest: B/L clear to auscultation anteriorly CVS:S1S2 regular Abdomen:soft non tender, non distended Extremities:no edema Neurology: Non focal Skin: no rash  Pertinent Labs/Radiology:    Latest Ref Rng & Units 01/25/2023    4:08 AM 01/24/2023    7:40 AM 01/23/2023   11:06 PM  CBC  WBC 4.0 - 10.5 K/uL 7.7     Hemoglobin 12.0 - 15.0 g/dL 8.0  8.4  5.4   Hematocrit 36.0 - 46.0 % 24.0  26.3  16.0   Platelets 150 - 400 K/uL 352       Lab Results  Component Value Date   NA 137 01/25/2023   K 2.5 (LL) 01/25/2023   CL 108 01/25/2023   CO2 19 (L) 01/25/2023      Assessment/Plan: Acute on chronic HFpEF due to noncompliance with hemodialysis Volume status improved-breathing is better after HD Continue volume removal with HD  ESRD on HD Noncompliant due to social/homelessness Still makes significant amount of urine-will plan  on placing on high-dose diuretics on discharge  COPD Not in exacerbation Bronchodilators  Normocytic anemia Primarily due to CKD/ESRD-noncompliance to Aranesp Denies any overt GI bleeding-no melena/hematochezia Hb stable after 2 units of PRBC  Hypokalemia Being repleted Will get dialysis and K bath accordingly.  HLD Statin  HTN BP already better with volume overload HD Continue to monitor  Homelessness Social work eval prior to discharge.  Underweight: Estimated body mass index is 18.33 kg/m as calculated from the following:   Height as of this encounter: 5\' 7"  (1.702 m).   Weight as of this encounter: 53.1 kg.   Code status:   Code Status: Full Code   DVT Prophylaxis: SCDs Start: 01/24/23 0110   Family Communication: None at bedside   Disposition Plan: Status is: Inpatient Remains inpatient appropriate because: Severity of anemia   Planned Discharge Destination:Home   Diet: Diet Order             Diet renal with fluid restriction Fluid restriction: 1200 mL Fluid; Room service appropriate? Yes; Fluid consistency: Thin  Diet effective now                     Antimicrobial agents: Anti-infectives (From admission, onward)    None        MEDICATIONS: Scheduled Meds:  atorvastatin  10 mg Oral Daily   busPIRone  10 mg Oral BID   Chlorhexidine Gluconate Cloth  6 each  Topical Q0600   Chlorhexidine Gluconate Cloth  6 each Topical Q0600   darbepoetin (ARANESP) injection - NON-DIALYSIS  60 mcg Subcutaneous Q Sat-1800   ferrous sulfate  325 mg Oral Q breakfast   folic acid  1 mg Oral Daily   furosemide  40 mg Intravenous BID   gabapentin  100 mg Oral TID   guaiFENesin  600 mg Oral BID   pantoprazole  40 mg Oral Daily   sodium bicarbonate  650 mg Oral TID   vitamin B-12  100 mcg Oral Daily   Continuous Infusions: PRN Meds:.acetaminophen **OR** acetaminophen, hydrALAZINE, hydrOXYzine, ipratropium-albuterol, ondansetron **OR** ondansetron  (ZOFRAN) IV, senna-docusate, traZODone   I have personally reviewed following labs and imaging studies  LABORATORY DATA: CBC: Recent Labs  Lab 01/23/23 2256 01/23/23 2306 01/24/23 0740 01/25/23 0408  WBC 5.6  --   --  7.7  NEUTROABS 2.9  --   --   --   HGB 4.9* 5.4* 8.4* 8.0*  HCT 15.6* 16.0* 26.3* 24.0*  MCV 104.7*  --   --  90.6  PLT 382  --   --  352    Basic Metabolic Panel: Recent Labs  Lab 01/23/23 2256 01/23/23 2306 01/24/23 0739 01/25/23 0408  NA 136 137 137 137  K 3.7 3.8 4.2 2.5*  CL 109 113* 111 108  CO2 13*  --  11* 19*  GLUCOSE 104* 98 172* 95  BUN 71* 82* 67* 50*  CREATININE 5.91* 6.30* 5.86* 4.59*  CALCIUM 7.5*  --  7.6* 7.9*    GFR: Estimated Creatinine Clearance: 10.7 mL/min (A) (by C-G formula based on SCr of 4.59 mg/dL (H)).  Liver Function Tests: Recent Labs  Lab 01/23/23 2256 01/24/23 0739 01/25/23 0408  AST 31 26 24   ALT 22 21 20   ALKPHOS 118 116 99  BILITOT 0.5 0.8 0.7  PROT 6.3* 6.5 5.8*  ALBUMIN 2.8* 2.7* 2.4*   No results for input(s): "LIPASE", "AMYLASE" in the last 168 hours. No results for input(s): "AMMONIA" in the last 168 hours.  Coagulation Profile: No results for input(s): "INR", "PROTIME" in the last 168 hours.  Cardiac Enzymes: No results for input(s): "CKTOTAL", "CKMB", "CKMBINDEX", "TROPONINI" in the last 168 hours.  BNP (last 3 results) No results for input(s): "PROBNP" in the last 8760 hours.  Lipid Profile: No results for input(s): "CHOL", "HDL", "LDLCALC", "TRIG", "CHOLHDL", "LDLDIRECT" in the last 72 hours.  Thyroid Function Tests: No results for input(s): "TSH", "T4TOTAL", "FREET4", "T3FREE", "THYROIDAB" in the last 72 hours.  Anemia Panel: Recent Labs    01/23/23 2350  VITAMINB12 715  FOLATE 21.4  FERRITIN 488*  TIBC 269  IRON 86  RETICCTPCT 5.8*    Urine analysis:    Component Value Date/Time   COLORURINE YELLOW 10/25/2022 2015   APPEARANCEUR HAZY (A) 10/25/2022 2015   LABSPEC 1.009  10/25/2022 2015   PHURINE 5.0 10/25/2022 2015   GLUCOSEU 50 (A) 10/25/2022 2015   HGBUR LARGE (A) 10/25/2022 2015   BILIRUBINUR NEGATIVE 10/25/2022 2015   KETONESUR NEGATIVE 10/25/2022 2015   PROTEINUR 100 (A) 10/25/2022 2015   UROBILINOGEN 0.2 03/02/2010 2027   NITRITE POSITIVE (A) 10/25/2022 2015   LEUKOCYTESUR MODERATE (A) 10/25/2022 2015    Sepsis Labs: Lactic Acid, Venous    Component Value Date/Time   LATICACIDVEN 1.0 10/25/2022 2017    MICROBIOLOGY: Recent Results (from the past 240 hour(s))  Resp panel by RT-PCR (RSV, Flu A&B, Covid) Anterior Nasal Swab     Status: None  Collection Time: 01/23/23 10:51 PM   Specimen: Anterior Nasal Swab  Result Value Ref Range Status   SARS Coronavirus 2 by RT PCR NEGATIVE NEGATIVE Final   Influenza A by PCR NEGATIVE NEGATIVE Final   Influenza B by PCR NEGATIVE NEGATIVE Final    Comment: (NOTE) The Xpert Xpress SARS-CoV-2/FLU/RSV plus assay is intended as an aid in the diagnosis of influenza from Nasopharyngeal swab specimens and should not be used as a sole basis for treatment. Nasal washings and aspirates are unacceptable for Xpert Xpress SARS-CoV-2/FLU/RSV testing.  Fact Sheet for Patients: BloggerCourse.com  Fact Sheet for Healthcare Providers: SeriousBroker.it  This test is not yet approved or cleared by the [place] FDA and has been authorized for detection and/or diagnosis of SARS-CoV-2 by FDA under an Emergency Use Authorization (EUA). This EUA will remain in effect (meaning this test can be used) for the duration of the COVID-19 declaration under Section 564(b)(1) of the Act, 21 U.S.C. section 360bbb-3(b)(1), unless the authorization is terminated or revoked.     Resp Syncytial Virus by PCR NEGATIVE NEGATIVE Final    Comment: (NOTE) Fact Sheet for Patients: BloggerCourse.com  Fact Sheet for Healthcare  Providers: SeriousBroker.it  This test is not yet approved or cleared by the [place] FDA and has been authorized for detection and/or diagnosis of SARS-CoV-2 by FDA under an Emergency Use Authorization (EUA). This EUA will remain in effect (meaning this test can be used) for the duration of the COVID-19 declaration under Section 564(b)(1) of the Act, 21 U.S.C. section 360bbb-3(b)(1), unless the authorization is terminated or revoked.  Performed at [facility], [address], [place], [place] [address]     RADIOLOGY STUDIES/RESULTS: DG Chest Port 1 View  Result Date: 01/24/2023 CLINICAL DATA:  Shortness of breath, cough EXAM: PORTABLE CHEST 1 VIEW COMPARISON:  11/21/2022 FINDINGS: Right dialysis catheter tip at the cavoatrial junction, unchanged. Heart is normal size. Mediastinal contours within normal limits. Right pleural effusion and right lower lobe atelectasis or infiltrate. Small left pleural effusion with left base atelectasis. Diffuse interstitial prominence, favor interstitial edema. IMPRESSION: Bilateral effusions and airspace opacities, right greater than left. Diffuse interstitial prominence, likely edema. Electronically Signed   By: Charlett Nose M.D.   On: 01/24/2023 00:22     LOS: 1 day   Jeoffrey Massed, MD  [facility]    To contact the attending provider between 7A-7P or the covering provider during after hours 7P-7A, please log into the web site [email or web address] and access using universal [place] password for that web site. If you do not have the password, please call the hospital operator.  01/25/2023, 11:11 AM

## 2023-01-25 NOTE — Plan of Care (Signed)
Patient states she is feeling some better.

## 2023-01-26 DIAGNOSIS — I10 Essential (primary) hypertension: Secondary | ICD-10-CM | POA: Diagnosis not present

## 2023-01-26 DIAGNOSIS — N186 End stage renal disease: Secondary | ICD-10-CM

## 2023-01-26 DIAGNOSIS — D649 Anemia, unspecified: Secondary | ICD-10-CM

## 2023-01-26 DIAGNOSIS — F411 Generalized anxiety disorder: Secondary | ICD-10-CM | POA: Diagnosis not present

## 2023-01-26 DIAGNOSIS — Z992 Dependence on renal dialysis: Secondary | ICD-10-CM

## 2023-01-26 LAB — RENAL FUNCTION PANEL
Albumin: 2.3 g/dL — ABNORMAL LOW (ref 3.5–5.0)
Anion gap: 9 (ref 5–15)
BUN: 54 mg/dL — ABNORMAL HIGH (ref 8–23)
CO2: 18 mmol/L — ABNORMAL LOW (ref 22–32)
Calcium: 7.8 mg/dL — ABNORMAL LOW (ref 8.9–10.3)
Chloride: 112 mmol/L — ABNORMAL HIGH (ref 98–111)
Creatinine, Ser: 4.67 mg/dL — ABNORMAL HIGH (ref 0.44–1.00)
GFR, Estimated: 10 mL/min — ABNORMAL LOW (ref >60)
Glucose, Bld: 127 mg/dL — ABNORMAL HIGH (ref 70–99)
Phosphorus: 4.6 mg/dL (ref 2.5–4.6)
Potassium: 3.2 mmol/L — ABNORMAL LOW (ref 3.5–5.1)
Sodium: 139 mmol/L (ref 135–145)

## 2023-01-26 LAB — CBC
HCT: 24.4 % — ABNORMAL LOW (ref 36.0–46.0)
Hemoglobin: 8 g/dL — ABNORMAL LOW (ref 12.0–15.0)
MCH: 30.7 pg (ref 26.0–34.0)
MCHC: 32.8 g/dL (ref 30.0–36.0)
MCV: 93.5 fL (ref 80.0–100.0)
Platelets: 327 10*3/uL (ref 150–400)
RBC: 2.61 MIL/uL — ABNORMAL LOW (ref 3.87–5.11)
RDW: 23 % — ABNORMAL HIGH (ref 11.5–15.5)
WBC: 7.2 10*3/uL (ref 4.0–10.5)
nRBC: 0.3 % — ABNORMAL HIGH (ref 0.0–0.2)

## 2023-01-26 MED ORDER — POTASSIUM CHLORIDE CRYS ER 20 MEQ PO TBCR
40.0000 meq | EXTENDED_RELEASE_TABLET | Freq: Once | ORAL | Status: AC
  Administered 2023-01-26: 40 meq via ORAL
  Filled 2023-01-26: qty 2

## 2023-01-26 MED ORDER — LIDOCAINE-PRILOCAINE 2.5-2.5 % EX CREA: TOPICAL_CREAM | CUTANEOUS | Status: DC | PRN

## 2023-01-26 NOTE — Progress Notes (Signed)
PROGRESS NOTE        PATIENT DETAILS Name: [name] Age: 62 y.o. Sex: female Date of Birth: [birth year] Admit Date: 01/23/2023 Admitting Physician Tereasa Coop, MD ZOX:[id number], [name], MD  Brief Summary: Patient is a 62 y.o.  female with history of ESRD on HD-noncompliant due to social/homelessness issues-presenting with shortness of breath-found to have pulmonary edema/volume overload and subsequently admitted to the hospitalist service.  Significant events: 11/7>> admit to [facility]  Significant studies: 11/7>> CXR: Pulmonary edema  Significant microbiology data: 11/7>> COVID/influenza/RSV PCR: Negative  Procedures: None  Consults: Allergy  Subjective: No major issues overnight.  Objective: Vitals: Blood pressure (!) 164/83, pulse 85, temperature 97.6 F (36.4 C), temperature source Oral, resp. rate 16, height 5\' 7"  (1.702 m), weight 53.1 kg, SpO2 94%.   Exam: Gen Exam:Alert awake-not in any distress HEENT:atraumatic, normocephalic Chest: B/L clear to auscultation anteriorly CVS:S1S2 regular Abdomen:soft non tender, non distended Extremities:no edema Neurology: Non focal Skin: no rash  Pertinent Labs/Radiology:    Latest Ref Rng & Units 01/26/2023    3:17 AM 01/25/2023    4:08 AM 01/24/2023    7:40 AM  CBC  WBC 4.0 - 10.5 K/uL 7.2  7.7    Hemoglobin 12.0 - 15.0 g/dL 8.0  8.0  8.4   Hematocrit 36.0 - 46.0 % 24.4  24.0  26.3   Platelets 150 - 400 K/uL 327  352      Lab Results  Component Value Date   NA 139 01/26/2023   K 3.2 (L) 01/26/2023   CL 112 (H) 01/26/2023   CO2 18 (L) 01/26/2023      Assessment/Plan: Acute on chronic HFpEF due to noncompliance with hemodialysis Improved after HD Still making urine-on IV Lasix Volume status stable On room air  ESRD on HD Noncompliant due to social/homelessness Underwent partial HD yesterday-stopped early due to access issues Still makes significant amount of urine-on IV  Lasix Nephrology following-await further recommendations.  COPD Not in exacerbation Bronchodilators  Normocytic anemia Primarily due to CKD/ESRD-noncompliance to Aranesp Denies any overt GI bleeding-no melena/hematochezia Hb stable after 2 units of PRBC  Hypokalemia Replete/recheck  HLD Statin  HTN BP already better with volume overload HD Continue to monitor  Homelessness Social work eval prior to discharge.  Underweight: Estimated body mass index is 18.33 kg/m as calculated from the following:   Height as of this encounter: 5\' 7"  (1.702 m).   Weight as of this encounter: 53.1 kg.   Code status:   Code Status: Full Code   DVT Prophylaxis: SCDs Start: 01/24/23 0110   Family Communication: None at bedside   Disposition Plan: Status is: Inpatient Remains inpatient appropriate because: Severity of anemia   Planned Discharge Destination:Home   Diet: Diet Order             Diet regular Room service appropriate? Yes; Fluid consistency: Thin; Fluid restriction: 1200 mL Fluid  Diet effective now                     Antimicrobial agents: Anti-infectives (From admission, onward)    None        MEDICATIONS: Scheduled Meds:  atorvastatin  10 mg Oral Daily   busPIRone  10 mg Oral BID   Chlorhexidine Gluconate Cloth  6 each Topical Q0600   Chlorhexidine Gluconate Cloth  6 each Topical  Q0600   darbepoetin (ARANESP) injection - NON-DIALYSIS  60 mcg Subcutaneous Q Sat-1800   ferrous sulfate  325 mg Oral Q breakfast   folic acid  1 mg Oral Daily   furosemide  80 mg Intravenous BID   gabapentin  100 mg Oral TID   guaiFENesin  600 mg Oral BID   pantoprazole  40 mg Oral Daily   sodium bicarbonate  650 mg Oral TID   vitamin B-12  100 mcg Oral Daily   Continuous Infusions: PRN Meds:.acetaminophen **OR** acetaminophen, hydrALAZINE, hydrOXYzine, ipratropium-albuterol, ondansetron **OR** ondansetron (ZOFRAN) IV, senna-docusate, traZODone   I have  personally reviewed following labs and imaging studies  LABORATORY DATA: CBC: Recent Labs  Lab 01/23/23 2256 01/23/23 2306 01/24/23 0740 01/25/23 0408 01/26/23 0317  WBC 5.6  --   --  7.7 7.2  NEUTROABS 2.9  --   --   --   --   HGB 4.9* 5.4* 8.4* 8.0* 8.0*  HCT 15.6* 16.0* 26.3* 24.0* 24.4*  MCV 104.7*  --   --  90.6 93.5  PLT 382  --   --  352 327    Basic Metabolic Panel: Recent Labs  Lab 01/23/23 2256 01/23/23 2306 01/24/23 0739 01/25/23 0408 01/25/23 1054 01/26/23 0317  NA 136 137 137 137  --  139  K 3.7 3.8 4.2 2.5*  --  3.2*  CL 109 113* 111 108  --  112*  CO2 13*  --  11* 19*  --  18*  GLUCOSE 104* 98 172* 95  --  127*  BUN 71* 82* 67* 50*  --  54*  CREATININE 5.91* 6.30* 5.86* 4.59*  --  4.67*  CALCIUM 7.5*  --  7.6* 7.9*  --  7.8*  PHOS  --   --   --   --  4.5 4.6    GFR: Estimated Creatinine Clearance: 10.5 mL/min (A) (by C-G formula based on SCr of 4.67 mg/dL (H)).  Liver Function Tests: Recent Labs  Lab 01/23/23 2256 01/24/23 0739 01/25/23 0408 01/26/23 0317  AST 31 26 24   --   ALT 22 21 20   --   ALKPHOS 118 116 99  --   BILITOT 0.5 0.8 0.7  --   PROT 6.3* 6.5 5.8*  --   ALBUMIN 2.8* 2.7* 2.4* 2.3*   No results for input(s): "LIPASE", "AMYLASE" in the last 168 hours. No results for input(s): "AMMONIA" in the last 168 hours.  Coagulation Profile: No results for input(s): "INR", "PROTIME" in the last 168 hours.  Cardiac Enzymes: No results for input(s): "CKTOTAL", "CKMB", "CKMBINDEX", "TROPONINI" in the last 168 hours.  BNP (last 3 results) No results for input(s): "PROBNP" in the last 8760 hours.  Lipid Profile: No results for input(s): "CHOL", "HDL", "LDLCALC", "TRIG", "CHOLHDL", "LDLDIRECT" in the last 72 hours.  Thyroid Function Tests: No results for input(s): "TSH", "T4TOTAL", "FREET4", "T3FREE", "THYROIDAB" in the last 72 hours.  Anemia Panel: Recent Labs    01/23/23 2350  VITAMINB12 715  FOLATE 21.4  FERRITIN 488*  TIBC  269  IRON 86  RETICCTPCT 5.8*    Urine analysis:    Component Value Date/Time   COLORURINE YELLOW 10/25/2022 2015   APPEARANCEUR HAZY (A) 10/25/2022 2015   LABSPEC 1.009 10/25/2022 2015   PHURINE 5.0 10/25/2022 2015   GLUCOSEU 50 (A) 10/25/2022 2015   HGBUR LARGE (A) 10/25/2022 2015   BILIRUBINUR NEGATIVE 10/25/2022 2015   KETONESUR NEGATIVE 10/25/2022 2015   PROTEINUR 100 (A) 10/25/2022 2015  UROBILINOGEN 0.2 03/02/2010 2027   NITRITE POSITIVE (A) 10/25/2022 2015   LEUKOCYTESUR MODERATE (A) 10/25/2022 2015    Sepsis Labs: Lactic Acid, Venous    Component Value Date/Time   LATICACIDVEN 1.0 10/25/2022 2017    MICROBIOLOGY: Recent Results (from the past 240 hour(s))  Resp panel by RT-PCR (RSV, Flu A&B, Covid) Anterior Nasal Swab     Status: None   Collection Time: 01/23/23 10:51 PM   Specimen: Anterior Nasal Swab  Result Value Ref Range Status   SARS Coronavirus 2 by RT PCR NEGATIVE NEGATIVE Final   Influenza A by PCR NEGATIVE NEGATIVE Final   Influenza B by PCR NEGATIVE NEGATIVE Final    Comment: (NOTE) The Xpert Xpress SARS-CoV-2/FLU/RSV plus assay is intended as an aid in the diagnosis of influenza from Nasopharyngeal swab specimens and should not be used as a sole basis for treatment. Nasal washings and aspirates are unacceptable for Xpert Xpress SARS-CoV-2/FLU/RSV testing.  Fact Sheet for Patients: BloggerCourse.com  Fact Sheet for Healthcare Providers: SeriousBroker.it  This test is not yet approved or cleared by the [place] FDA and has been authorized for detection and/or diagnosis of SARS-CoV-2 by FDA under an Emergency Use Authorization (EUA). This EUA will remain in effect (meaning this test can be used) for the duration of the COVID-19 declaration under Section 564(b)(1) of the Act, 21 U.S.C. section 360bbb-3(b)(1), unless the authorization is terminated or revoked.     Resp Syncytial Virus by  PCR NEGATIVE NEGATIVE Final    Comment: (NOTE) Fact Sheet for Patients: BloggerCourse.com  Fact Sheet for Healthcare Providers: SeriousBroker.it  This test is not yet approved or cleared by the [place] FDA and has been authorized for detection and/or diagnosis of SARS-CoV-2 by FDA under an Emergency Use Authorization (EUA). This EUA will remain in effect (meaning this test can be used) for the duration of the COVID-19 declaration under Section 564(b)(1) of the Act, 21 U.S.C. section 360bbb-3(b)(1), unless the authorization is terminated or revoked.  Performed at [facility], [address] [place], [place] [address]     RADIOLOGY STUDIES/RESULTS: ECHOCARDIOGRAM LIMITED  Result Date: 01/25/2023    ECHOCARDIOGRAM LIMITED REPORT   Patient Name:   Foristell CONTRERAZ Ravenscroft Date of Exam: 01/25/2023 Medical Rec #:  [id number]      Height:       67.0 in Accession #:    [id number]     Weight:       117.1 lb Date of Birth:  [birth year]      BSA:          1.611 m Patient Age:    62 years       BP:           157/83 mmHg Patient Gender: F              HR:           80 bpm. Exam Location:  Inpatient Procedure: Limited Echo, Color Doppler and Cardiac Doppler Indications:    CHF  History:        Patient has prior history of Echocardiogram examinations, most                 recent 08/13/2022. CHF, COPD, Signs/Symptoms:Dyspnea; Risk                 Factors:Hypertension, Current Smoker and IVDU, ETOH abuse.  Sonographer:    Milbert Coulter Referring Phys: [id number] SUBRINA SUNDIL IMPRESSIONS  1. LIMITED ECHOCARDIOGRAM.  2. Left  ventricular ejection fraction, by estimation, is 50 to 55%. The left ventricle has low normal function. The left ventricle has no regional wall motion abnormalities. Diastolic dysfunction not assessed as tissue Doppler was not measured.  3. Right ventricular systolic function grossly normal. The right ventricular size is grossly normal.  4. The  mitral valve is grossly normal. Trivial mitral valve regurgitation. No evidence of mitral stenosis.  5. The aortic valve was not well visualized. Aortic valve regurgitation is not visualized.  6. The inferior vena cava is normal in size with greater than 50% respiratory variability, suggesting right atrial pressure of 3 mmHg. FINDINGS  Left Ventricle: Left ventricular ejection fraction, by estimation, is 50 to 55%. The left ventricle has low normal function. The left ventricle has no regional wall motion abnormalities. The left ventricular internal cavity size was normal in size. There is no left ventricular hypertrophy. Diastolic dysfunction not assessed as tissue Doppler was not measured. Right Ventricle: The right ventricular size is grossly normal. Right ventricular systolic function grossly normal. Pericardium: There is no evidence of pericardial effusion. Mitral Valve: The mitral valve is grossly normal. Mild mitral annular calcification. Trivial mitral valve regurgitation. No evidence of mitral valve stenosis. Tricuspid Valve: The tricuspid valve is grossly normal. Aortic Valve: The aortic valve was not well visualized. Aortic valve regurgitation is not visualized. Venous: The inferior vena cava is normal in size with greater than 50% respiratory variability, suggesting right atrial pressure of 3 mmHg. Additional Comments: Spectral Doppler performed. Color Doppler performed.  LEFT VENTRICLE PLAX 2D LVIDd:         5.10 cm LVIDs:         3.90 cm LV PW:         0.90 cm LV IVS:        0.70 cm  LEFT ATRIUM         Index LA diam:    3.70 cm 2.30 cm/m   AORTA Ao Root diam: 2.90 cm Sunit Tolia Electronically signed by Tessa Lerner Signature Date/Time: 01/25/2023/1:24:55 PM    Final      LOS: 2 days   Jeoffrey Massed, MD  [facility]    To contact the attending provider between 7A-7P or the covering provider during after hours 7P-7A, please log into the web site [email or web address] and access using universal  [place] password for that web site. If you do not have the password, please call the hospital operator.  01/26/2023, 10:37 AM

## 2023-01-26 NOTE — Progress Notes (Signed)
[facility] Progress Note   Subjective:   Patient seen and examined at bedside.  Refused HD yesterday due to pain in her arm.  States pain is still present but swelling has improved.  Agrees to try to use it again tomorrow.  Denies CP, SOB, abdominal pain and n/v/d.   Objective Vitals:   01/25/23 2041 01/26/23 0000 01/26/23 0754 01/26/23 0800  BP: (!) 155/75 124/68 (!) 164/83   Pulse: (!) 101 81 85   Resp: 19 13 17 16   Temp: 97.8 F (36.6 C) 98 F (36.7 C) 97.6 F (36.4 C)   TempSrc: Oral  Oral   SpO2: 97% 94%    Weight:      Height:       Physical Exam General:chronically ill appearing female in NAD Heart:RRR, no mrg Lungs:CTAB, nml WOB on RA Abdomen:soft, NTND Extremities:no LE edema Dialysis Access: [facility], RU AVF +b/t   Filed Weights   01/23/23 2250  Weight: 53.1 kg    Intake/Output Summary (Last 24 hours) at 01/26/2023 1035 Last data filed at 01/26/2023 0900 Gross per 24 hour  Intake 1380 ml  Output 1100 ml  Net 280 ml    Additional Objective Labs: Basic Metabolic Panel: Recent Labs  Lab 01/24/23 0739 01/25/23 0408 01/25/23 1054 01/26/23 0317  NA 137 137  --  139  K 4.2 2.5*  --  3.2*  CL 111 108  --  112*  CO2 11* 19*  --  18*  GLUCOSE 172* 95  --  127*  BUN 67* 50*  --  54*  CREATININE 5.86* 4.59*  --  4.67*  CALCIUM 7.6* 7.9*  --  7.8*  PHOS  --   --  4.5 4.6   Liver Function Tests: Recent Labs  Lab 01/23/23 2256 01/24/23 0739 01/25/23 0408 01/26/23 0317  AST 31 26 24   --   ALT 22 21 20   --   ALKPHOS 118 116 99  --   BILITOT 0.5 0.8 0.7  --   PROT 6.3* 6.5 5.8*  --   ALBUMIN 2.8* 2.7* 2.4* 2.3*    CBC: Recent Labs  Lab 01/23/23 2256 01/23/23 2306 01/24/23 0740 01/25/23 0408 01/26/23 0317  WBC 5.6  --   --  7.7 7.2  NEUTROABS 2.9  --   --   --   --   HGB 4.9*   < > 8.4* 8.0* 8.0*  HCT 15.6*   < > 26.3* 24.0* 24.4*  MCV 104.7*  --   --  90.6 93.5  PLT 382  --   --  352 327   < > = values in this interval not  displayed.    Iron Studies:  Recent Labs    01/23/23 2350  IRON 86  TIBC 269  FERRITIN 488*   Lab Results  Component Value Date   INR 1.2 11/10/2022   INR 1.1 08/12/2022   INR 1.0 07/31/2018   Studies/Results: ECHOCARDIOGRAM LIMITED  Result Date: 01/25/2023    ECHOCARDIOGRAM LIMITED REPORT   Patient Name:   [name] Date of Exam: 01/25/2023 Medical Rec #:  [id number]      Height:       67.0 in Accession #:    [id number]     Weight:       117.1 lb Date of Birth:  [birth year]      BSA:          1.611 m Patient Age:    62 years  BP:           157/83 mmHg Patient Gender: F              HR:           80 bpm. Exam Location:  Inpatient Procedure: Limited Echo, Color Doppler and Cardiac Doppler Indications:    CHF  History:        Patient has prior history of Echocardiogram examinations, most                 recent 08/13/2022. CHF, COPD, Signs/Symptoms:Dyspnea; Risk                 Factors:Hypertension, Current Smoker and IVDU, ETOH abuse.  Sonographer:    Milbert Coulter Referring Phys: [id number] SUBRINA SUNDIL IMPRESSIONS  1. LIMITED ECHOCARDIOGRAM.  2. Left ventricular ejection fraction, by estimation, is 50 to 55%. The left ventricle has low normal function. The left ventricle has no regional wall motion abnormalities. Diastolic dysfunction not assessed as tissue Doppler was not measured.  3. Right ventricular systolic function grossly normal. The right ventricular size is grossly normal.  4. The mitral valve is grossly normal. Trivial mitral valve regurgitation. No evidence of mitral stenosis.  5. The aortic valve was not well visualized. Aortic valve regurgitation is not visualized.  6. The inferior vena cava is normal in size with greater than 50% respiratory variability, suggesting right atrial pressure of 3 mmHg. FINDINGS  Left Ventricle: Left ventricular ejection fraction, by estimation, is 50 to 55%. The left ventricle has low normal function. The left ventricle has no regional wall motion  abnormalities. The left ventricular internal cavity size was normal in size. There is no left ventricular hypertrophy. Diastolic dysfunction not assessed as tissue Doppler was not measured. Right Ventricle: The right ventricular size is grossly normal. Right ventricular systolic function grossly normal. Pericardium: There is no evidence of pericardial effusion. Mitral Valve: The mitral valve is grossly normal. Mild mitral annular calcification. Trivial mitral valve regurgitation. No evidence of mitral valve stenosis. Tricuspid Valve: The tricuspid valve is grossly normal. Aortic Valve: The aortic valve was not well visualized. Aortic valve regurgitation is not visualized. Venous: The inferior vena cava is normal in size with greater than 50% respiratory variability, suggesting right atrial pressure of 3 mmHg. Additional Comments: Spectral Doppler performed. Color Doppler performed.  LEFT VENTRICLE PLAX 2D LVIDd:         5.10 cm LVIDs:         3.90 cm LV PW:         0.90 cm LV IVS:        0.70 cm  LEFT ATRIUM         Index LA diam:    3.70 cm 2.30 cm/m   AORTA Ao Root diam: 2.90 cm Sunit Tolia Electronically signed by [employer] Signature Date/Time: 01/25/2023/1:24:55 PM    Final     Medications:   atorvastatin  10 mg Oral Daily   busPIRone  10 mg Oral BID   Chlorhexidine Gluconate Cloth  6 each Topical Q0600   Chlorhexidine Gluconate Cloth  6 each Topical Q0600   darbepoetin (ARANESP) injection - NON-DIALYSIS  60 mcg Subcutaneous Q Sat-1800   ferrous sulfate  325 mg Oral Q breakfast   folic acid  1 mg Oral Daily   furosemide  80 mg Intravenous BID   gabapentin  100 mg Oral TID   guaiFENesin  600 mg Oral BID   pantoprazole  40 mg Oral Daily  sodium bicarbonate  650 mg Oral TID   vitamin B-12  100 mcg Oral Daily     Assessment/Plan: 1. Volume overload - pulmonary edema on CXR. Likely 2/2 missed HD. On RA.  Minimal UF with HD Friday d/t issue with access.  Refused HD yesterday.  Plan for HD  tomorrow. 2. ESRD - Difficult situation because the patient remains homeless and does not have the ability to get reliable transportation to dialysis. She was accepted to 2 HD centers in [place] but did not attend outpatient treatments at either one. Does not currently have an outpatient center. Will be difficult to place d/t social situation. Refused HD yesterday d/t pain in access. Plan for HD tomorrow using AVF w/smaller needles, low flows. 3. Access issue - TDC not working. Difficultly with AVF Friday but was 1st time using.  If able to use tomorrow will have TDC removed.  If ongoing issues with pain, will need to consult [facility] to evaluate.  4. Anemia of CKD- Hgb improved to 8.0 s/p 2 units pRBC. It was 4.9 on admit. Ts[place] in goal. Aranesp qwk started yesterday.  5. Secondary hyperparathyroidism - CCa and phos in goal.  Not on binder or VDRA. Check pth. 6. HTN - BP variable. On Lasix.   7. Hypokalemia - K 3.2 this AM.  Will liberalized diet. Monitor labs closely.  8. Nutrition - renal diet w/fluid restrictions.  Alb 2.3 - start protein supplements. Renal Vit.   Virgina Norfolk, PA-C [facility] 01/26/2023,10:35 AM  LOS: 2 days  \

## 2023-01-26 NOTE — Plan of Care (Signed)
Patient denies complaints of shortness of breath. Patient refused hemo yesterday.

## 2023-01-27 DIAGNOSIS — F411 Generalized anxiety disorder: Secondary | ICD-10-CM | POA: Diagnosis not present

## 2023-01-27 DIAGNOSIS — I10 Essential (primary) hypertension: Secondary | ICD-10-CM | POA: Diagnosis not present

## 2023-01-27 DIAGNOSIS — N186 End stage renal disease: Secondary | ICD-10-CM | POA: Diagnosis not present

## 2023-01-27 DIAGNOSIS — D649 Anemia, unspecified: Secondary | ICD-10-CM | POA: Diagnosis not present

## 2023-01-27 LAB — RENAL FUNCTION PANEL
Albumin: 2.4 g/dL — ABNORMAL LOW (ref 3.5–5.0)
Anion gap: 13 (ref 5–15)
BUN: 62 mg/dL — ABNORMAL HIGH (ref 8–23)
CO2: 16 mmol/L — ABNORMAL LOW (ref 22–32)
Calcium: 8.3 mg/dL — ABNORMAL LOW (ref 8.9–10.3)
Chloride: 108 mmol/L (ref 98–111)
Creatinine, Ser: 4.9 mg/dL — ABNORMAL HIGH (ref 0.44–1.00)
GFR, Estimated: 9 mL/min — ABNORMAL LOW (ref >60)
Glucose, Bld: 92 mg/dL (ref 70–99)
Phosphorus: 4 mg/dL (ref 2.5–4.6)
Potassium: 3.8 mmol/L (ref 3.5–5.1)
Sodium: 137 mmol/L (ref 135–145)

## 2023-01-27 LAB — BPAM RBC
Blood Product Expiration Date: 202411192359
Blood Product Expiration Date: 202411212359
Blood Product Expiration Date: 202411222359
Blood Product Expiration Date: 202411232359
ISSUE DATE / TIME: 202411080056
ISSUE DATE / TIME: 202411080305
Unit Type and Rh: 6200
Unit Type and Rh: 6200
Unit Type and Rh: 6200
Unit Type and Rh: 6200

## 2023-01-27 LAB — TYPE AND SCREEN
ABO/RH(D): A POS
Antibody Screen: NEGATIVE
Unit division: 0
Unit division: 0
Unit division: 0
Unit division: 0

## 2023-01-27 LAB — CBC
HCT: 24.4 % — ABNORMAL LOW (ref 36.0–46.0)
Hemoglobin: 7.8 g/dL — ABNORMAL LOW (ref 12.0–15.0)
MCH: 30.6 pg (ref 26.0–34.0)
MCHC: 32 g/dL (ref 30.0–36.0)
MCV: 95.7 fL (ref 80.0–100.0)
Platelets: 320 10*3/uL (ref 150–400)
RBC: 2.55 MIL/uL — ABNORMAL LOW (ref 3.87–5.11)
RDW: 22.9 % — ABNORMAL HIGH (ref 11.5–15.5)
WBC: 8.2 10*3/uL (ref 4.0–10.5)
nRBC: 0.4 % — ABNORMAL HIGH (ref 0.0–0.2)

## 2023-01-27 MED ORDER — TORSEMIDE 20 MG PO TABS
100.0000 mg | ORAL_TABLET | Freq: Every day | ORAL | Status: DC
Start: 2023-01-27 — End: 2023-02-01
  Administered 2023-01-27 – 2023-02-01 (×5): 100 mg via ORAL
  Filled 2023-01-27 (×6): qty 5

## 2023-01-27 NOTE — Progress Notes (Signed)
Pt reminded and educated on keeping access arm still with needles in it to prevent infiltration

## 2023-01-27 NOTE — Plan of Care (Signed)

## 2023-01-27 NOTE — Progress Notes (Signed)
[facility] Progress Note   Subjective:   Alert but tired this AM, no new concerns. Denies SOB, CP, dizziness, nausea.   Objective Vitals:   01/27/23 0033 01/27/23 0332 01/27/23 0500 01/27/23 0842  BP: (!) 134/57 132/63  135/75  Pulse: 73 89 74 82  Resp: 12 16 11 18   Temp: 98 F (36.7 C) 97.7 F (36.5 C)  97.8 F (36.6 C)  TempSrc: Oral Oral  Oral  SpO2:  99% 100% 100%  Weight:   53.2 kg   Height:       Physical Exam General: Alert female in NAD Heart: RRR, no murmurs, rubs or gallops Lungs: CTA bilaterally, respirations unlabored Abdomen: Soft, non-distended, +BS Extremities: No edema b/l lower extremities Dialysis Access:  TDC, RUE AVF + t/b  Additional Objective Labs: Basic Metabolic Panel: Recent Labs  Lab 01/24/23 0739 01/25/23 0408 01/25/23 1054 01/26/23 0317  NA 137 137  --  139  K 4.2 2.5*  --  3.2*  CL 111 108  --  112*  CO2 11* 19*  --  18*  GLUCOSE 172* 95  --  127*  BUN 67* 50*  --  54*  CREATININE 5.86* 4.59*  --  4.67*  CALCIUM 7.6* 7.9*  --  7.8*  PHOS  --   --  4.5 4.6   Liver Function Tests: Recent Labs  Lab 01/23/23 2256 01/24/23 0739 01/25/23 0408 01/26/23 0317  AST 31 26 24   --   ALT 22 21 20   --   ALKPHOS 118 116 99  --   BILITOT 0.5 0.8 0.7  --   PROT 6.3* 6.5 5.8*  --   ALBUMIN 2.8* 2.7* 2.4* 2.3*   No results for input(s): "LIPASE", "AMYLASE" in the last 168 hours. CBC: Recent Labs  Lab 01/23/23 2256 01/23/23 2306 01/25/23 0408 01/26/23 0317 01/27/23 0434  WBC 5.6  --  7.7 7.2 8.2  NEUTROABS 2.9  --   --   --   --   HGB 4.9*   < > 8.0* 8.0* 7.8*  HCT 15.6*   < > 24.0* 24.4* 24.4*  MCV 104.7*  --  90.6 93.5 95.7  PLT 382  --  352 327 320   < > = values in this interval not displayed.   Blood Culture    Component Value Date/Time   SDES URINE, CLEAN CATCH 10/25/2022 2015   SPECREQUEST  10/25/2022 2015    NONE Reflexed from [id number] Performed at [facility], [address], [place],  [place] [address]    CULT (A) 10/25/2022 2015    >=100,000 COLONIES/mL KLEBSIELLA PNEUMONIAE Confirmed Extended Spectrum Beta-Lactamase Producer (ESBL).  In bloodstream infections from ESBL organisms, carbapenems are preferred over piperacillin/tazobactam. They are shown to have a lower risk of mortality.    REPTSTATUS 10/27/2022 FINAL 10/25/2022 2015    Cardiac Enzymes: No results for input(s): "CKTOTAL", "CKMB", "CKMBINDEX", "TROPONINI" in the last 168 hours. CBG: No results for input(s): "GLUCAP" in the last 168 hours. Iron Studies: No results for input(s): "IRON", "TIBC", "TRANSFERRIN", "FERRITIN" in the last 72 hours. @lablastinr3 @ Studies/Results: ECHOCARDIOGRAM LIMITED  Result Date: 01/25/2023    ECHOCARDIOGRAM LIMITED REPORT   Patient Name:   [name] Date of Exam: 01/25/2023 Medical Rec #:  [id number]      Height:       67.0 in Accession #:    [id number]     Weight:       117.1 lb Date of Birth:  Jul 26, 1960      BSA:          1.611 m Patient Age:    62 years       BP:           157/83 mmHg Patient Gender: F              HR:           80 bpm. Exam Location:  Inpatient Procedure: Limited Echo, Color Doppler and Cardiac Doppler Indications:    CHF  History:        Patient has prior history of Echocardiogram examinations, most                 recent 08/13/2022. CHF, COPD, Signs/Symptoms:Dyspnea; Risk                 Factors:Hypertension, Current Smoker and IVDU, ETOH abuse.  Sonographer:    Milbert Coulter Referring Phys: [id number] SUBRINA SUNDIL IMPRESSIONS  1. LIMITED ECHOCARDIOGRAM.  2. Left ventricular ejection fraction, by estimation, is 50 to 55%. The left ventricle has low normal function. The left ventricle has no regional wall motion abnormalities. Diastolic dysfunction not assessed as tissue Doppler was not measured.  3. Right ventricular systolic function grossly normal. The right ventricular size is grossly normal.  4. The mitral valve is grossly normal. Trivial mitral valve regurgitation. No  evidence of mitral stenosis.  5. The aortic valve was not well visualized. Aortic valve regurgitation is not visualized.  6. The inferior vena cava is normal in size with greater than 50% respiratory variability, suggesting right atrial pressure of 3 mmHg. FINDINGS  Left Ventricle: Left ventricular ejection fraction, by estimation, is 50 to 55%. The left ventricle has low normal function. The left ventricle has no regional wall motion abnormalities. The left ventricular internal cavity size was normal in size. There is no left ventricular hypertrophy. Diastolic dysfunction not assessed as tissue Doppler was not measured. Right Ventricle: The right ventricular size is grossly normal. Right ventricular systolic function grossly normal. Pericardium: There is no evidence of pericardial effusion. Mitral Valve: The mitral valve is grossly normal. Mild mitral annular calcification. Trivial mitral valve regurgitation. No evidence of mitral valve stenosis. Tricuspid Valve: The tricuspid valve is grossly normal. Aortic Valve: The aortic valve was not well visualized. Aortic valve regurgitation is not visualized. Venous: The inferior vena cava is normal in size with greater than 50% respiratory variability, suggesting right atrial pressure of 3 mmHg. Additional Comments: Spectral Doppler performed. Color Doppler performed.  LEFT VENTRICLE PLAX 2D LVIDd:         5.10 cm LVIDs:         3.90 cm LV PW:         0.90 cm LV IVS:        0.70 cm  LEFT ATRIUM         Index LA diam:    3.70 cm 2.30 cm/m   AORTA Ao Root diam: 2.90 cm Sunit Tolia Electronically signed by Tessa Lerner Signature Date/Time: 01/25/2023/1:24:55 PM    Final    Medications:   atorvastatin  10 mg Oral Daily   busPIRone  10 mg Oral BID   Chlorhexidine Gluconate Cloth  6 each Topical Q0600   Chlorhexidine Gluconate Cloth  6 each Topical Q0600   darbepoetin (ARANESP) injection - NON-DIALYSIS  60 mcg Subcutaneous Q Sat-1800   ferrous sulfate  325 mg Oral Q  breakfast   folic acid  1 mg Oral Daily  gabapentin  100 mg Oral TID   guaiFENesin  600 mg Oral BID   pantoprazole  40 mg Oral Daily   sodium bicarbonate  650 mg Oral TID   torsemide  100 mg Oral Daily   vitamin B-12  100 mcg Oral Daily     Assessment/Plan:  1. Volume overload - pulmonary edema on CXR. Likely 2/2 missed HD. On RA.  Minimal UF with HD Friday d/t issue with access. Refused HD Saturday due to pain in access.  Plan for HD today 2. ESRD - Difficult situation because the patient remains homeless and does not have the ability to get reliable transportation to dialysis. She was accepted to 2 HD centers in [place] but did not attend outpatient treatments at either one. Does not currently have an outpatient center. Will be difficult to place d/t social situation. Refused HD yesterday d/t pain in access. Plan for HD today using AVF w/smaller needles, low flows. Establishing schedule.  3. Access issue - Apparently TDC is not working. Difficultly with AVF Friday but was 1st time using.  If able to use today will have TDC removed.  If ongoing issues with pain, will need to consult [facility] to evaluate.  4. Anemia of CKD- Hgb improved to 7.8 s/p 2 units pRBC. It was 4.9 on admit. Tsat in goal. Aranesp qwk  5. Secondary hyperparathyroidism - CCa and phos in goal.  Not on binder or VDRA. Check pth. 6. HTN - BP variable. On Lasix.   7. Hypokalemia - K 2.5 on 11/9, was replaced orally and improved to 3.2. Diet liberalized. Labs pending today.  8. Nutrition - renal diet w/fluid restrictions.  Alb 2.3 - start protein supplements. Renal Vit.   Rogers Blocker, PA-C 01/27/2023, 10:48 AM  [facility] Pager: [phone]
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
## 2023-01-27 NOTE — Progress Notes (Signed)
PROGRESS NOTE        PATIENT DETAILS Name: [name] Age: 62 y.o. Sex: female Date of Birth: 1960/11/30 Admit Date: 01/23/2023 Admitting Physician Tereasa Coop, MD [id number], [name], MD  Brief Summary: Patient is a 62 y.o.  female with history of ESRD on HD-noncompliant due to social/homelessness issues-presenting with shortness of breath-found to have pulmonary edema/volume overload and subsequently admitted to the hospitalist service.  Significant events: 11/7>> admit to [facility]  Significant studies: 11/7>> CXR: Pulmonary edema  Significant microbiology data: 11/7>> COVID/influenza/RSV PCR: Negative  Procedures: None  Consults: Allergy  Subjective: About getting HD through her AVF today-apparently last time when they tried-it caused a lot of discomfort.  Objective: Vitals: Blood pressure 135/75, pulse 82, temperature 97.8 F (36.6 C), temperature source Oral, resp. rate 18, height 5\' 7"  (1.702 m), weight 53.2 kg, SpO2 100%.   Exam: Gen Exam:Alert awake-not in any distress HEENT:atraumatic, normocephalic Chest: B/L clear to auscultation anteriorly CVS:S1S2 regular Abdomen:soft non tender, non distended Extremities:no edema Neurology: Non focal Skin: no rash  Pertinent Labs/Radiology:    Latest Ref Rng & Units 01/27/2023    4:34 AM 01/26/2023    3:17 AM 01/25/2023    4:08 AM  CBC  WBC 4.0 - 10.5 K/uL 8.2  7.2  7.7   Hemoglobin 12.0 - 15.0 g/dL 7.8  8.0  8.0   Hematocrit 36.0 - 46.0 % 24.4  24.4  24.0   Platelets 150 - 400 K/uL 320  327  352     Lab Results  Component Value Date   NA 139 01/26/2023   K 3.2 (L) 01/26/2023   CL 112 (H) 01/26/2023   CO2 18 (L) 01/26/2023      Assessment/Plan: Acute on chronic HFpEF due to noncompliance with hemodialysis Improved after HD Still makes urine-initially on IV Lasix but has been switched to daily Demadex dosing. Volume status stable On room air  ESRD on HD Noncompliant  due to social/homelessness Very anxious about getting HD through aVF today Nephrology following-await further recommendations.  Difficult patient-homeless-hence does not have a dedicated HD center-will plan on continuing Demadex on discharge to minimize volume issues when she is not on HD.  COPD Not in exacerbation Bronchodilators  Normocytic anemia Primarily due to CKD/ESRD-noncompliance to Aranesp Denies any overt GI bleeding-no melena/hematochezia Hb stable after 2 units of PRBC  Hypokalemia Repleted.  HLD Statin  HTN BP already better with volume overload HD Continue to monitor  Homelessness Social work eval prior to discharge.  Underweight: Estimated body mass index is 18.37 kg/m as calculated from the following:   Height as of this encounter: 5\' 7"  (1.702 m).   Weight as of this encounter: 53.2 kg.   Code status:   Code Status: Full Code   DVT Prophylaxis: SCDs Start: 01/24/23 0110   Family Communication: None at bedside   Disposition Plan: Status is: Inpatient Remains inpatient appropriate because: Severity of anemia   Planned Discharge Destination:Home   Diet: Diet Order             Diet regular Room service appropriate? Yes; Fluid consistency: Thin; Fluid restriction: 1200 mL Fluid  Diet effective now                     Antimicrobial agents: Anti-infectives (From admission, onward)    None  MEDICATIONS: Scheduled Meds:  atorvastatin  10 mg Oral Daily   busPIRone  10 mg Oral BID   Chlorhexidine Gluconate Cloth  6 each Topical Q0600   Chlorhexidine Gluconate Cloth  6 each Topical Q0600   darbepoetin (ARANESP) injection - NON-DIALYSIS  60 mcg Subcutaneous Q Sat-1800   ferrous sulfate  325 mg Oral Q breakfast   folic acid  1 mg Oral Daily   gabapentin  100 mg Oral TID   guaiFENesin  600 mg Oral BID   pantoprazole  40 mg Oral Daily   sodium bicarbonate  650 mg Oral TID   torsemide  100 mg Oral Daily   vitamin B-12  100  mcg Oral Daily   Continuous Infusions: PRN Meds:.acetaminophen **OR** acetaminophen, hydrALAZINE, hydrOXYzine, ipratropium-albuterol, lidocaine-prilocaine, ondansetron **OR** ondansetron (ZOFRAN) IV, senna-docusate, traZODone   I have personally reviewed following labs and imaging studies  LABORATORY DATA: CBC: Recent Labs  Lab 01/23/23 2256 01/23/23 2306 01/24/23 0740 01/25/23 0408 01/26/23 0317 01/27/23 0434  WBC 5.6  --   --  7.7 7.2 8.2  NEUTROABS 2.9  --   --   --   --   --   HGB 4.9* 5.4* 8.4* 8.0* 8.0* 7.8*  HCT 15.6* 16.0* 26.3* 24.0* 24.4* 24.4*  MCV 104.7*  --   --  90.6 93.5 95.7  PLT 382  --   --  352 327 320    Basic Metabolic Panel: Recent Labs  Lab 01/23/23 2256 01/23/23 2306 01/24/23 0739 01/25/23 0408 01/25/23 1054 01/26/23 0317  NA 136 137 137 137  --  139  K 3.7 3.8 4.2 2.5*  --  3.2*  CL 109 113* 111 108  --  112*  CO2 13*  --  11* 19*  --  18*  GLUCOSE 104* 98 172* 95  --  127*  BUN 71* 82* 67* 50*  --  54*  CREATININE 5.91* 6.30* 5.86* 4.59*  --  4.67*  CALCIUM 7.5*  --  7.6* 7.9*  --  7.8*  PHOS  --   --   --   --  4.5 4.6    GFR: Estimated Creatinine Clearance: 10.5 mL/min (A) (by C-G formula based on SCr of 4.67 mg/dL (H)).  Liver Function Tests: Recent Labs  Lab 01/23/23 2256 01/24/23 0739 01/25/23 0408 01/26/23 0317  AST 31 26 24   --   ALT 22 21 20   --   ALKPHOS 118 116 99  --   BILITOT 0.5 0.8 0.7  --   PROT 6.3* 6.5 5.8*  --   ALBUMIN 2.8* 2.7* 2.4* 2.3*   No results for input(s): "LIPASE", "AMYLASE" in the last 168 hours. No results for input(s): "AMMONIA" in the last 168 hours.  Coagulation Profile: No results for input(s): "INR", "PROTIME" in the last 168 hours.  Cardiac Enzymes: No results for input(s): "CKTOTAL", "CKMB", "CKMBINDEX", "TROPONINI" in the last 168 hours.  BNP (last 3 results) No results for input(s): "PROBNP" in the last 8760 hours.  Lipid Profile: No results for input(s): "CHOL", "HDL",  "LDLCALC", "TRIG", "CHOLHDL", "LDLDIRECT" in the last 72 hours.  Thyroid Function Tests: No results for input(s): "TSH", "T4TOTAL", "FREET4", "T3FREE", "THYROIDAB" in the last 72 hours.  Anemia Panel: No results for input(s): "VITAMINB12", "FOLATE", "FERRITIN", "TIBC", "IRON", "RETICCTPCT" in the last 72 hours.   Urine analysis:    Component Value Date/Time   COLORURINE YELLOW 10/25/2022 2015   APPEARANCEUR HAZY (A) 10/25/2022 2015   LABSPEC 1.009 10/25/2022 2015   PHURINE  5.0 10/25/2022 2015   GLUCOSEU 50 (A) 10/25/2022 2015   HGBUR LARGE (A) 10/25/2022 2015   BILIRUBINUR NEGATIVE 10/25/2022 2015   KETONESUR NEGATIVE 10/25/2022 2015   PROTEINUR 100 (A) 10/25/2022 2015   UROBILINOGEN 0.2 03/02/2010 2027   NITRITE POSITIVE (A) 10/25/2022 2015   LEUKOCYTESUR MODERATE (A) 10/25/2022 2015    Sepsis Labs: Lactic Acid, Venous    Component Value Date/Time   LATICACIDVEN 1.0 10/25/2022 2017    MICROBIOLOGY: Recent Results (from the past 240 hour(s))  Resp panel by RT-PCR (RSV, Flu A&B, Covid) Anterior Nasal Swab     Status: None   Collection Time: 01/23/23 10:51 PM   Specimen: Anterior Nasal Swab  Result Value Ref Range Status   SARS Coronavirus 2 by RT PCR NEGATIVE NEGATIVE Final   Influenza A by PCR NEGATIVE NEGATIVE Final   Influenza B by PCR NEGATIVE NEGATIVE Final    Comment: (NOTE) The Xpert Xpress SARS-CoV-2/FLU/RSV plus assay is intended as an aid in the diagnosis of influenza from Nasopharyngeal swab specimens and should not be used as a sole basis for treatment. Nasal washings and aspirates are unacceptable for Xpert Xpress SARS-CoV-2/FLU/RSV testing.  Fact Sheet for Patients: BloggerCourse.com  Fact Sheet for Healthcare Providers: SeriousBroker.it  This test is not yet approved or cleared by the [place] FDA and has been authorized for detection and/or diagnosis of SARS-CoV-2 by FDA under an Emergency  Use Authorization (EUA). This EUA will remain in effect (meaning this test can be used) for the duration of the COVID-19 declaration under Section 564(b)(1) of the Act, 21 U.S.C. section 360bbb-3(b)(1), unless the authorization is terminated or revoked.     Resp Syncytial Virus by PCR NEGATIVE NEGATIVE Final    Comment: (NOTE) Fact Sheet for Patients: BloggerCourse.com  Fact Sheet for Healthcare Providers: SeriousBroker.it  This test is not yet approved or cleared by the [place] FDA and has been authorized for detection and/or diagnosis of SARS-CoV-2 by FDA under an Emergency Use Authorization (EUA). This EUA will remain in effect (meaning this test can be used) for the duration of the COVID-19 declaration under Section 564(b)(1) of the Act, 21 U.S.C. section 360bbb-3(b)(1), unless the authorization is terminated or revoked.  Performed at [facility], [address], [place], [place] [address]     RADIOLOGY STUDIES/RESULTS: ECHOCARDIOGRAM LIMITED  Result Date: 01/25/2023    ECHOCARDIOGRAM LIMITED REPORT   Patient Name:   ALEXSYS TETTER Keiffer Date of Exam: 01/25/2023 Medical Rec #:  [id number]      Height:       67.0 in Accession #:    [id number]     Weight:       117.1 lb Date of Birth:  09-03-1960      BSA:          1.611 m Patient Age:    62 years       BP:           157/83 mmHg Patient Gender: F              HR:           80 bpm. Exam Location:  Inpatient Procedure: Limited Echo, Color Doppler and Cardiac Doppler Indications:    CHF  History:        Patient has prior history of Echocardiogram examinations, most                 recent 08/13/2022. CHF, COPD, Signs/Symptoms:Dyspnea; Risk  Factors:Hypertension, Current Smoker and IVDU, ETOH abuse.  Sonographer:    Milbert Coulter Referring Phys: [id number] SUBRINA SUNDIL IMPRESSIONS  1. LIMITED ECHOCARDIOGRAM.  2. Left ventricular ejection fraction, by estimation, is 50 to  55%. The left ventricle has low normal function. The left ventricle has no regional wall motion abnormalities. Diastolic dysfunction not assessed as tissue Doppler was not measured.  3. Right ventricular systolic function grossly normal. The right ventricular size is grossly normal.  4. The mitral valve is grossly normal. Trivial mitral valve regurgitation. No evidence of mitral stenosis.  5. The aortic valve was not well visualized. Aortic valve regurgitation is not visualized.  6. The inferior vena cava is normal in size with greater than 50% respiratory variability, suggesting right atrial pressure of 3 mmHg. FINDINGS  Left Ventricle: Left ventricular ejection fraction, by estimation, is 50 to 55%. The left ventricle has low normal function. The left ventricle has no regional wall motion abnormalities. The left ventricular internal cavity size was normal in size. There is no left ventricular hypertrophy. Diastolic dysfunction not assessed as tissue Doppler was not measured. Right Ventricle: The right ventricular size is grossly normal. Right ventricular systolic function grossly normal. Pericardium: There is no evidence of pericardial effusion. Mitral Valve: The mitral valve is grossly normal. Mild mitral annular calcification. Trivial mitral valve regurgitation. No evidence of mitral valve stenosis. Tricuspid Valve: The tricuspid valve is grossly normal. Aortic Valve: The aortic valve was not well visualized. Aortic valve regurgitation is not visualized. Venous: The inferior vena cava is normal in size with greater than 50% respiratory variability, suggesting right atrial pressure of 3 mmHg. Additional Comments: Spectral Doppler performed. Color Doppler performed.  LEFT VENTRICLE PLAX 2D LVIDd:         5.10 cm LVIDs:         3.90 cm LV PW:         0.90 cm LV IVS:        0.70 cm  LEFT ATRIUM         Index LA diam:    3.70 cm 2.30 cm/m   AORTA Ao Root diam: 2.90 cm Sunit Tolia Electronically signed by Tessa Lerner  Signature Date/Time: 01/25/2023/1:24:55 PM    Final      LOS: 3 days   Jeoffrey Massed, MD  [facility]    To contact the attending provider between 7A-7P or the covering provider during after hours 7P-7A, please log into the web site [email or web address] and access using universal [place] password for that web site. If you do not have the password, please call the hospital operator.  01/27/2023, 10:31 AM

## 2023-01-27 NOTE — Progress Notes (Signed)
Received patient in bed to unit.  Alert and oriented.  Informed consent signed and in chart.   TX duration:3  Patient tolerated well.  Transported back to the room  Alert, without acute distress.  Hand-off given to patient's nurse.   Access used: right AVF Access issues: alarmed half of treatment, pt not receptive of education of keeping arm still with needles placed  Total UF removed: 1.9L Medication(s) given: none   01/27/23 1814  Vitals  Temp 97.8 F (36.6 C)  Temp Source Oral  BP (!) 152/73  MAP (mmHg) 96  BP Location Left Arm  BP Method Automatic  Patient Position (if appropriate) Lying  Pulse Rate 87  Pulse Rate Source Monitor  ECG Heart Rate 85  Resp 12  Oxygen Therapy  SpO2 98 %  O2 Device Room Air  During Treatment Monitoring  Duration of HD Treatment -hour(s) 3 hour(s)  Cumulative Fluid Removed (mL) per Treatment  1892.2  HD Safety Checks Performed Yes  Intra-Hemodialysis Comments Tx completed  Dialysis Fluid Bolus Normal Saline  Bolus Amount (mL) 300 mL  Fistula / Graft Right Upper arm Arteriovenous fistula  Placement Date/Time: 08/14/22 1230   Orientation: Right  Access Location: (c) Upper arm  Access Type: Arteriovenous fistula  Site Condition No complications  Fistula / Graft Assessment Present;Thrill;Bruit  Status Patent  Drainage Description None  Hemodialysis Catheter Right Internal jugular Double lumen Permanent (Tunneled)  Placement Date/Time: 08/14/22 1144   Placed prior to admission: No  Serial / Lot #: [id number]  Expiration Date: 01/22/27  Time Out: Correct patient;Correct site;Correct procedure  Maximum sterile barrier precautions: Hand hygiene;Cap;Mask;Sterile gow...  Site Condition No complications  Blue Lumen Status Dead end cap in place (old blood clot in lumen)  Red Lumen Status Dead end cap in place (old blood clot in lumen)  Purple Lumen Status N/A  Dressing Type Transparent  Dressing Status Antimicrobial disc in place;Clean, Dry,  Intact  Drainage Description Black  Dressing Change Due 01/30/23      [name] [name]

## 2023-01-28 ENCOUNTER — Inpatient Hospital Stay (HOSPITAL_COMMUNITY): Payer: Medicare Other

## 2023-01-28 DIAGNOSIS — Z992 Dependence on renal dialysis: Secondary | ICD-10-CM | POA: Diagnosis not present

## 2023-01-28 DIAGNOSIS — N186 End stage renal disease: Secondary | ICD-10-CM | POA: Diagnosis not present

## 2023-01-28 DIAGNOSIS — F411 Generalized anxiety disorder: Secondary | ICD-10-CM | POA: Diagnosis not present

## 2023-01-28 DIAGNOSIS — D649 Anemia, unspecified: Secondary | ICD-10-CM | POA: Diagnosis not present

## 2023-01-28 DIAGNOSIS — T82898A Other specified complication of vascular prosthetic devices, implants and grafts, initial encounter: Secondary | ICD-10-CM | POA: Diagnosis not present

## 2023-01-28 DIAGNOSIS — I10 Essential (primary) hypertension: Secondary | ICD-10-CM | POA: Diagnosis not present

## 2023-01-28 LAB — CBC
HCT: 25.2 % — ABNORMAL LOW (ref 36.0–46.0)
Hemoglobin: 8.4 g/dL — ABNORMAL LOW (ref 12.0–15.0)
MCH: 31.3 pg (ref 26.0–34.0)
MCHC: 33.3 g/dL (ref 30.0–36.0)
MCV: 94 fL (ref 80.0–100.0)
Platelets: 302 10*3/uL (ref 150–400)
RBC: 2.68 MIL/uL — ABNORMAL LOW (ref 3.87–5.11)
RDW: 23.1 % — ABNORMAL HIGH (ref 11.5–15.5)
WBC: 7.3 10*3/uL (ref 4.0–10.5)
nRBC: 0 % (ref 0.0–0.2)

## 2023-01-28 LAB — PARATHYROID HORMONE, INTACT (NO CA): PTH: 555 pg/mL — ABNORMAL HIGH (ref 15–65)

## 2023-01-28 MED ORDER — CHLORHEXIDINE GLUCONATE CLOTH 2 % EX PADS
6.0000 | MEDICATED_PAD | Freq: Every day | CUTANEOUS | Status: DC
  Administered 2023-01-29 – 2023-01-30 (×2): 6 via TOPICAL

## 2023-01-28 MED ORDER — LIDOCAINE HCL (PF) 2 % IJ SOLN
10.0000 mL | Freq: Once | INTRAMUSCULAR | Status: AC
  Administered 2023-01-28: 7.5 mL via INTRADERMAL
  Filled 2023-01-28: qty 10

## 2023-01-28 MED ORDER — POLYETHYLENE GLYCOL 3350 17 G PO PACK
17.0000 g | PACK | Freq: Every day | ORAL | Status: DC
  Administered 2023-01-28 – 2023-01-30 (×2): 17 g via ORAL
  Filled 2023-01-28 (×5): qty 1

## 2023-01-28 MED ORDER — SIMETHICONE 80 MG PO CHEW
80.0000 mg | CHEWABLE_TABLET | Freq: Four times a day (QID) | ORAL | Status: DC | PRN
  Administered 2023-01-28 – 2023-01-29 (×4): 80 mg via ORAL
  Filled 2023-01-28 (×4): qty 1

## 2023-01-28 MED ORDER — ACETAMINOPHEN 500 MG PO TABS
1000.0000 mg | ORAL_TABLET | Freq: Four times a day (QID) | ORAL | Status: DC | PRN
  Administered 2023-01-28 – 2023-01-29 (×3): 1000 mg via ORAL
  Filled 2023-01-28 (×3): qty 2

## 2023-01-28 NOTE — Progress Notes (Addendum)
   01/28/23 0300  Provider Notification  Provider Name/Title Segars, MD  Date Provider Notified 01/28/23  Time Provider Notified 0215  Method of Notification Page  Notification Reason Other (Comment)  Test performed and critical result RLQ Pain  Date Critical Result Received 01/28/23  Time Critical Result Received 0215  Provider response En route;See new orders;At bedside  Date of Provider Response 01/28/23  Time of Provider Response 0245   C/O Abdominal pain at the RLQ, MD at bedside, investigation ordered and meds administered as prescribed, vitals WDLs, will continue to monitor.

## 2023-01-28 NOTE — Progress Notes (Signed)
Mobility Specialist Progress Note;   01/28/23 1110  Mobility  Activity Ambulated with assistance in hallway  Level of Assistance Contact guard assist, steadying assist  Assistive Device Other (Comment) (Hall handrails)  Distance Ambulated (ft) 325 ft  Activity Response Tolerated well  Mobility Referral Yes  $Mobility charge 1 Mobility  Mobility Specialist Start Time (ACUTE ONLY) 1110  Mobility Specialist Stop Time (ACUTE ONLY) 1130  Mobility Specialist Time Calculation (min) (ACUTE ONLY) 20 min   Pt agreeable to mobility. Required minG assistance during ambulation for safety. Would furniture walk in room and use handrails in hall for stability, otherwise independent. C/o legs feeling fatigued d/t [place] ambulation. Pt back in bed with all needs met.   Caesar Bookman Mobility Specialist Please contact via SecureChat or Rehab Office [phone]
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
## 2023-01-28 NOTE — Progress Notes (Signed)
PROGRESS NOTE        PATIENT DETAILS Name: [name] Age: 62 y.o. Sex: female Date of Birth: Nov 19, 1960 Admit Date: 01/23/2023 Admitting Physician Tereasa Coop, MD [id number], [name], MD  Brief Summary: Patient is a 62 y.o.  female with history of ESRD on HD-noncompliant due to social/homelessness issues-presenting with shortness of breath-found to have pulmonary edema/volume overload and subsequently admitted to the hospitalist service.  Significant events: 11/7>> admit to [facility]  Significant studies: 11/7>> CXR: Pulmonary edema  Significant microbiology data: 11/7>> COVID/influenza/RSV PCR: Negative  Procedures: None  Consults: Allergy  Subjective: Some abdominal pain overnight but better after she had a small bowel movement.  No abdominal pain this morning when I saw her.  Objective: Vitals: Blood pressure (!) 161/55, pulse 81, temperature 97.9 F (36.6 C), temperature source Oral, resp. rate 14, height 5\' 7"  (1.702 m), weight 50.5 kg, SpO2 99%.   Exam: Gen Exam:Alert awake-not in any distress HEENT:atraumatic, normocephalic Chest: B/L clear to auscultation anteriorly CVS:S1S2 regular Abdomen:soft non tender, non distended Extremities:no edema Neurology: Non focal Skin: no rash  Pertinent Labs/Radiology:    Latest Ref Rng & Units 01/28/2023    4:12 AM 01/27/2023    4:34 AM 01/26/2023    3:17 AM  CBC  WBC 4.0 - 10.5 K/uL 7.3  8.2  7.2   Hemoglobin 12.0 - 15.0 g/dL 8.4  7.8  8.0   Hematocrit 36.0 - 46.0 % 25.2  24.4  24.4   Platelets 150 - 400 K/uL 302  320  327     Lab Results  Component Value Date   NA 137 01/27/2023   K 3.8 01/27/2023   CL 108 01/27/2023   CO2 16 (L) 01/27/2023      Assessment/Plan: Acute on chronic HFpEF due to noncompliance with hemodialysis Improved after HD Still makes urine-initially on IV Lasix but has been switched to daily Demadex dosing. Volume status stable On room air  ESRD on  HD Noncompliant due to social/homelessness Tolerated HD through [facility]VF 11/11 (although multiple alarms w HD) Will discuss with nephrology to see if we can get [facility] removed Apparently her daughter has made some living arrangements-if so-she will likely need to be clipped before discharge.  COPD Not in exacerbation Bronchodilators  Normocytic anemia Primarily due to CKD/ESRD-noncompliance to Aranesp Denies any overt GI bleeding-no melena/hematochezia Hb stable after 2 units of PRBC  Hypokalemia Repleted.  HLD Statin  HTN BP already better with volume overload HD Continue to monitor  Homelessness Social work eval prior to discharge.  Underweight: Estimated body mass index is 17.44 kg/m as calculated from the following:   Height as of this encounter: 5\' 7"  (1.702 m).   Weight as of this encounter: 50.5 kg.   Code status:   Code Status: Full Code   DVT Prophylaxis: SCDs Start: 01/24/23 0110   Family Communication: None at bedside   Disposition Plan: Status is: Inpatient Remains inpatient appropriate because: Severity of anemia   Planned Discharge Destination:Home   Diet: Diet Order             Diet regular Room service appropriate? Yes; Fluid consistency: Thin; Fluid restriction: 1200 mL Fluid  Diet effective now                     Antimicrobial agents: Anti-infectives (From admission, onward)  None        MEDICATIONS: Scheduled Meds:  atorvastatin  10 mg Oral Daily   busPIRone  10 mg Oral BID   Chlorhexidine Gluconate Cloth  6 each Topical Q0600   darbepoetin (ARANESP) injection - NON-DIALYSIS  60 mcg Subcutaneous Q Sat-1800   ferrous sulfate  325 mg Oral Q breakfast   folic acid  1 mg Oral Daily   gabapentin  100 mg Oral TID   guaiFENesin  600 mg Oral BID   pantoprazole  40 mg Oral Daily   polyethylene glycol  17 g Oral Daily   sodium bicarbonate  650 mg Oral TID   torsemide  100 mg Oral Daily   vitamin B-12  100 mcg Oral Daily    Continuous Infusions: PRN Meds:.acetaminophen, hydrALAZINE, hydrOXYzine, ipratropium-albuterol, lidocaine-prilocaine, ondansetron **OR** ondansetron (ZOFRAN) IV, senna-docusate, simethicone, traZODone   I have personally reviewed following labs and imaging studies  LABORATORY DATA: CBC: Recent Labs  Lab 01/23/23 2256 01/23/23 2306 01/24/23 0740 01/25/23 0408 01/26/23 0317 01/27/23 0434 01/28/23 0412  WBC 5.6  --   --  7.7 7.2 8.2 7.3  NEUTROABS 2.9  --   --   --   --   --   --   HGB 4.9*   < > 8.4* 8.0* 8.0* 7.8* 8.4*  HCT 15.6*   < > 26.3* 24.0* 24.4* 24.4* 25.2*  MCV 104.7*  --   --  90.6 93.5 95.7 94.0  PLT 382  --   --  352 327 320 302   < > = values in this interval not displayed.    Basic Metabolic Panel: Recent Labs  Lab 01/23/23 2256 01/23/23 2306 01/24/23 0739 01/25/23 0408 01/25/23 1054 01/26/23 0317 01/27/23 1307  NA 136 137 137 137  --  139 137  K 3.7 3.8 4.2 2.5*  --  3.2* 3.8  CL 109 113* 111 108  --  112* 108  CO2 13*  --  11* 19*  --  18* 16*  GLUCOSE 104* 98 172* 95  --  127* 92  BUN 71* 82* 67* 50*  --  54* 62*  CREATININE 5.91* 6.30* 5.86* 4.59*  --  4.67* 4.90*  CALCIUM 7.5*  --  7.6* 7.9*  --  7.8* 8.3*  PHOS  --   --   --   --  4.5 4.6 4.0    GFR: Estimated Creatinine Clearance: 9.5 mL/min (A) (by C-G formula based on SCr of 4.9 mg/dL (H)).  Liver Function Tests: Recent Labs  Lab 01/23/23 2256 01/24/23 0739 01/25/23 0408 01/26/23 0317 01/27/23 1307  AST 31 26 24   --   --   ALT 22 21 20   --   --   ALKPHOS 118 116 99  --   --   BILITOT 0.5 0.8 0.7  --   --   PROT 6.3* 6.5 5.8*  --   --   ALBUMIN 2.8* 2.7* 2.4* 2.3* 2.4*   No results for input(s): "LIPASE", "AMYLASE" in the last 168 hours. No results for input(s): "AMMONIA" in the last 168 hours.  Coagulation Profile: No results for input(s): "INR", "PROTIME" in the last 168 hours.  Cardiac Enzymes: No results for input(s): "CKTOTAL", "CKMB", "CKMBINDEX", "TROPONINI" in  the last 168 hours.  BNP (last 3 results) No results for input(s): "PROBNP" in the last 8760 hours.  Lipid Profile: No results for input(s): "CHOL", "HDL", "LDLCALC", "TRIG", "CHOLHDL", "LDLDIRECT" in the last 72 hours.  Thyroid Function Tests: No results  for input(s): "TSH", "T4TOTAL", "FREET4", "T3FREE", "THYROIDAB" in the last 72 hours.  Anemia Panel: No results for input(s): "VITAMINB12", "FOLATE", "FERRITIN", "TIBC", "IRON", "RETICCTPCT" in the last 72 hours.   Urine analysis:    Component Value Date/Time   COLORURINE YELLOW 10/25/2022 2015   APPEARANCEUR HAZY (A) 10/25/2022 2015   LABSPEC 1.009 10/25/2022 2015   PHURINE 5.0 10/25/2022 2015   GLUCOSEU 50 (A) 10/25/2022 2015   HGBUR LARGE (A) 10/25/2022 2015   BILIRUBINUR NEGATIVE 10/25/2022 2015   KETONESUR NEGATIVE 10/25/2022 2015   PROTEINUR 100 (A) 10/25/2022 2015   UROBILINOGEN 0.2 03/02/2010 2027   NITRITE POSITIVE (A) 10/25/2022 2015   LEUKOCYTESUR MODERATE (A) 10/25/2022 2015    Sepsis Labs: Lactic Acid, Venous    Component Value Date/Time   LATICACIDVEN 1.0 10/25/2022 2017    MICROBIOLOGY: Recent Results (from the past 240 hour(s))  Resp panel by RT-PCR (RSV, Flu A&B, Covid) Anterior Nasal Swab     Status: None   Collection Time: 01/23/23 10:51 PM   Specimen: Anterior Nasal Swab  Result Value Ref Range Status   SARS Coronavirus 2 by RT PCR NEGATIVE NEGATIVE Final   Influenza A by PCR NEGATIVE NEGATIVE Final   Influenza B by PCR NEGATIVE NEGATIVE Final    Comment: (NOTE) The Xpert Xpress SARS-CoV-2/FLU/RSV plus assay is intended as an aid in the diagnosis of influenza from Nasopharyngeal swab specimens and should not be used as a sole basis for treatment. Nasal washings and aspirates are unacceptable for Xpert Xpress SARS-CoV-2/FLU/RSV testing.  Fact Sheet for Patients: BloggerCourse.com  Fact Sheet for Healthcare  Providers: SeriousBroker.it  This test is not yet approved or cleared by the [place] FDA and has been authorized for detection and/or diagnosis of SARS-CoV-2 by FDA under an Emergency Use Authorization (EUA). This EUA will remain in effect (meaning this test can be used) for the duration of the COVID-19 declaration under Section 564(b)(1) of the Act, 21 U.S.C. section 360bbb-3(b)(1), unless the authorization is terminated or revoked.     Resp Syncytial Virus by PCR NEGATIVE NEGATIVE Final    Comment: (NOTE) Fact Sheet for Patients: BloggerCourse.com  Fact Sheet for Healthcare Providers: SeriousBroker.it  This test is not yet approved or cleared by the [place] FDA and has been authorized for detection and/or diagnosis of SARS-CoV-2 by FDA under an Emergency Use Authorization (EUA). This EUA will remain in effect (meaning this test can be used) for the duration of the COVID-19 declaration under Section 564(b)(1) of the Act, 21 U.S.C. section 360bbb-3(b)(1), unless the authorization is terminated or revoked.  Performed at [facility], [address], [place], [place] [address]     RADIOLOGY STUDIES/RESULTS: No results found.   LOS: 4 days   Jeoffrey Massed, MD  [facility]    To contact the attending provider between 7A-7P or the covering provider during after hours 7P-7A, please log into the web site [email or web address] and access using universal [place] password for that web site. If you do not have the password, please call the hospital operator.  01/28/2023, 10:03 AM

## 2023-01-28 NOTE — Progress Notes (Signed)
  Procedure Note   PRE-OPERATIVE DIAGNOSIS:   ESRD with non functioning right internal jugular TDC   POST-OPERATIVE DIAGNOSIS:  ESRD with functioning right AV fistula  PROCEDURE:  Removal of right IJ tunneled dialysis catheter   PROVIDER:   Corrie Arnetra Terris PA-C.  Consent Signed: yes Time Out Completed: Yes On Anticoagulation: No  Procedure: Consent obtained Sterile prepping and draping over right upper chest and neck area 6.5 cc of 2% lidocaine plain was injected over removal site and around cuff Right IJ catheter was removed using hemostat and scissors with cuff intact Pressure held in right supraclavicular region and over catheter exit site for 5 minutes to ensure hemostasis Patient was maintained at 45 degree position and clean dressings were applied to exit site Patient tolerated procedure well without any complications  Instructions were provided to the patient regarding wound care and bleeding  Graceann Congress, PA-C [facility] [phone] 01/28/2023  5:04 PM

## 2023-01-28 NOTE — Progress Notes (Addendum)
[facility] Progress Note   Subjective:   Pt reports she had abdominal pain overnight but had a BM and is feeling better. KUB ordered. Denies SOB, CP, dizziness, nausea. AVF alarming often during HD yesterday, RN reports she was moving her arm a lot but also may need some heparin.   Objective Vitals:   01/27/23 1816 01/27/23 1817 01/28/23 0225 01/28/23 0400  BP:  (!) 161/55    Pulse:  81    Resp:  14    Temp:    97.9 F (36.6 C)  TempSrc:    Oral  SpO2:  100%  99%  Weight: 50.5 kg  50.5 kg   Height:       Physical Exam General: Alert, chronically ill appearing female in NAD Heart: RRR, no murmurs, rubs or gallops Lungs: CTA bilaterally, on RA Abdomen: Soft, non-distended, +BS Extremities: No edema b/l lower extremities Dialysis Access: AVF + t/b, TDC with intact bandage  Additional Objective Labs: Basic Metabolic Panel: Recent Labs  Lab 01/25/23 0408 01/25/23 1054 01/26/23 0317 01/27/23 1307  NA 137  --  139 137  K 2.5*  --  3.2* 3.8  CL 108  --  112* 108  CO2 19*  --  18* 16*  GLUCOSE 95  --  127* 92  BUN 50*  --  54* 62*  CREATININE 4.59*  --  4.67* 4.90*  CALCIUM 7.9*  --  7.8* 8.3*  PHOS  --  4.5 4.6 4.0   Liver Function Tests: Recent Labs  Lab 01/23/23 2256 01/24/23 0739 01/25/23 0408 01/26/23 0317 01/27/23 1307  AST 31 26 24   --   --   ALT 22 21 20   --   --   ALKPHOS 118 116 99  --   --   BILITOT 0.5 0.8 0.7  --   --   PROT 6.3* 6.5 5.8*  --   --   ALBUMIN 2.8* 2.7* 2.4* 2.3* 2.4*   No results for input(s): "LIPASE", "AMYLASE" in the last 168 hours. CBC: Recent Labs  Lab 01/23/23 2256 01/23/23 2306 01/25/23 0408 01/26/23 0317 01/27/23 0434 01/28/23 0412  WBC 5.6  --  7.7 7.2 8.2 7.3  NEUTROABS 2.9  --   --   --   --   --   HGB 4.9*   < > 8.0* 8.0* 7.8* 8.4*  HCT 15.6*   < > 24.0* 24.4* 24.4* 25.2*  MCV 104.7*  --  90.6 93.5 95.7 94.0  PLT 382  --  352 327 320 302   < > = values in this interval not displayed.   Blood  Culture    Component Value Date/Time   SDES URINE, CLEAN CATCH 10/25/2022 2015   SPECREQUEST  10/25/2022 2015    NONE Reflexed from [id number] Performed at [facility], [address], [place], [place] [address]    CULT (A) 10/25/2022 2015    >=100,000 COLONIES/mL KLEBSIELLA PNEUMONIAE Confirmed Extended Spectrum Beta-Lactamase Producer (ESBL).  In bloodstream infections from ESBL organisms, carbapenems are preferred over piperacillin/tazobactam. They are shown to have a lower risk of mortality.    REPTSTATUS 10/27/2022 FINAL 10/25/2022 2015    Cardiac Enzymes: No results for input(s): "CKTOTAL", "CKMB", "CKMBINDEX", "TROPONINI" in the last 168 hours. CBG: No results for input(s): "GLUCAP" in the last 168 hours. Iron Studies: No results for input(s): "IRON", "TIBC", "TRANSFERRIN", "FERRITIN" in the last 72 hours. @lablastinr3 @ Studies/Results: No results found. Medications:   atorvastatin  10 mg Oral Daily  busPIRone  10 mg Oral BID   Chlorhexidine Gluconate Cloth  6 each Topical Q0600   Chlorhexidine Gluconate Cloth  6 each Topical Q0600   darbepoetin (ARANESP) injection - NON-DIALYSIS  60 mcg Subcutaneous Q Sat-1800   ferrous sulfate  325 mg Oral Q breakfast   folic acid  1 mg Oral Daily   gabapentin  100 mg Oral TID   guaiFENesin  600 mg Oral BID   pantoprazole  40 mg Oral Daily   polyethylene glycol  17 g Oral Daily   sodium bicarbonate  650 mg Oral TID   torsemide  100 mg Oral Daily   vitamin B-12  100 mcg Oral Daily    Assessment/Plan: 1. Volume overload - pulmonary edema on CXR. Likely 2/2 missed HD. On RA.  Minimal UF with HD Friday d/t issue with access. Refused HD Saturday due to pain in access.  Plan for HD today 2. ESRD - Difficult situation because the patient remains homeless and does not have the ability to get reliable transportation to dialysis. She was accepted to 2 HD centers in [place] but did not attend outpatient treatments at either one. Does  not currently have an outpatient center. Will be difficult to place d/t social situation.  3. Access issue - AVF alarming often yesterday, may be due to patient moving but will try again tomorrow with low dose heparin. TDC reportedly not functional anyway, IR reports [facility] placed that catheter so will ask if they can remove it.  4. Anemia of CKD- Hgb improved to 8.4 s/p 2 units pRBC. It was 4.9 on admit. Tsat in goal. Aranesp qwk  5. Secondary hyperparathyroidism - CCa and phos in goal.  Not on binder or VDRA. Check pth. 6. HTN - BP variable. On Lasix.   7. Hypokalemia - K 2.5 on 11/9, was replaced orally and improved to 3.8. Diet liberalized.  8. Nutrition -   Alb 2.4, reports she lost a lot of weight recently but is eating more since she has been here  9. Dispo: currently homeless which prevents her from arranging HD transportation. She reports her daughter has a camper she can stay in as [place] as she promises to go to HD. Reports she plans to talk to SW about this.   Rogers Blocker, PA-C 01/28/2023, 9:33 AM  [facility] Pager: [phone]

## 2023-01-28 NOTE — Plan of Care (Signed)

## 2023-01-29 DIAGNOSIS — D649 Anemia, unspecified: Secondary | ICD-10-CM | POA: Diagnosis not present

## 2023-01-29 DIAGNOSIS — F411 Generalized anxiety disorder: Secondary | ICD-10-CM | POA: Diagnosis not present

## 2023-01-29 DIAGNOSIS — I10 Essential (primary) hypertension: Secondary | ICD-10-CM | POA: Diagnosis not present

## 2023-01-29 DIAGNOSIS — N186 End stage renal disease: Secondary | ICD-10-CM | POA: Diagnosis not present

## 2023-01-29 LAB — CBC
HCT: 24.5 % — ABNORMAL LOW (ref 36.0–46.0)
Hemoglobin: 7.9 g/dL — ABNORMAL LOW (ref 12.0–15.0)
MCH: 31.2 pg (ref 26.0–34.0)
MCHC: 32.2 g/dL (ref 30.0–36.0)
MCV: 96.8 fL (ref 80.0–100.0)
Platelets: 264 10*3/uL (ref 150–400)
RBC: 2.53 MIL/uL — ABNORMAL LOW (ref 3.87–5.11)
RDW: 22.5 % — ABNORMAL HIGH (ref 11.5–15.5)
WBC: 7.2 10*3/uL (ref 4.0–10.5)
nRBC: 0 % (ref 0.0–0.2)

## 2023-01-29 LAB — RENAL FUNCTION PANEL
Albumin: 2.7 g/dL — ABNORMAL LOW (ref 3.5–5.0)
Anion gap: 12 (ref 5–15)
BUN: 54 mg/dL — ABNORMAL HIGH (ref 8–23)
CO2: 20 mmol/L — ABNORMAL LOW (ref 22–32)
Calcium: 8.7 mg/dL — ABNORMAL LOW (ref 8.9–10.3)
Chloride: 104 mmol/L (ref 98–111)
Creatinine, Ser: 4.37 mg/dL — ABNORMAL HIGH (ref 0.44–1.00)
GFR, Estimated: 11 mL/min — ABNORMAL LOW (ref >60)
Glucose, Bld: 88 mg/dL (ref 70–99)
Phosphorus: 3.2 mg/dL (ref 2.5–4.6)
Potassium: 4.5 mmol/L (ref 3.5–5.1)
Sodium: 136 mmol/L (ref 135–145)

## 2023-01-29 MED ORDER — ALTEPLASE 2 MG IJ SOLR: 2.0000 mg | Freq: Once | INTRAMUSCULAR | Status: DC | PRN

## 2023-01-29 MED ORDER — LIDOCAINE-PRILOCAINE 2.5-2.5 % EX CREA: 1.0000 | TOPICAL_CREAM | CUTANEOUS | Status: DC | PRN

## 2023-01-29 MED ORDER — ANTICOAGULANT SODIUM CITRATE 4% (200MG/5ML) IV SOLN: 5.0000 mL | Status: DC | PRN

## 2023-01-29 MED ORDER — LIDOCAINE HCL (PF) 1 % IJ SOLN: 5.0000 mL | INTRAMUSCULAR | Status: DC | PRN

## 2023-01-29 MED ORDER — PENTAFLUOROPROP-TETRAFLUOROETH EX AERO: 1.0000 | INHALATION_SPRAY | CUTANEOUS | Status: DC | PRN

## 2023-01-29 MED ORDER — HEPARIN SODIUM (PORCINE) 1000 UNIT/ML DIALYSIS: 1000.0000 [IU] | INTRAMUSCULAR | Status: DC | PRN

## 2023-01-29 MED ORDER — OXYCODONE HCL 5 MG PO TABS
5.0000 mg | ORAL_TABLET | Freq: Four times a day (QID) | ORAL | Status: DC | PRN
  Administered 2023-01-29 – 2023-02-01 (×3): 5 mg via ORAL
  Filled 2023-01-29 (×4): qty 1

## 2023-01-29 MED ORDER — HEPARIN SODIUM (PORCINE) 1000 UNIT/ML DIALYSIS
2000.0000 [IU] | Freq: Once | INTRAMUSCULAR | Status: AC
Start: 2023-01-29 — End: 2023-01-29
  Administered 2023-01-29: 2000 [IU] via INTRAVENOUS_CENTRAL
  Filled 2023-01-29: qty 2

## 2023-01-29 NOTE — Progress Notes (Signed)
Received patient in bed to unit.  Alert and oriented.  Informed consent signed and in chart.   TX duration: 3 hours  Patient tolerated well.  Transported back to the room  Alert, without acute distress.  Hand-off given to patient's nurse.   Access used: Right upper arm fistula Access issues: Patient feels she may have had muscle spasms making the arterial pressure go high.  Blood Flow Rate had to be turned down to 300.  Total UF removed: 1.5L Medication(s) given: Oxycodone   01/29/23 1527  Vitals  Temp 98.9 F (37.2 C)  Temp Source Oral  BP 117/63  MAP (mmHg) 79  BP Location Left Arm  BP Method Automatic  Patient Position (if appropriate) Lying  Pulse Rate 75  Pulse Rate Source Monitor  ECG Heart Rate 78  Resp 16  Oxygen Therapy  SpO2 98 %  O2 Device Room Air  During Treatment Monitoring  Duration of HD Treatment -hour(s) 3 hour(s)  HD Safety Checks Performed Yes  Intra-Hemodialysis Comments Tx completed;Tolerated well  Dialysis Fluid Bolus Normal Saline  Bolus Amount (mL) 300 mL  Fistula / Graft Right Upper arm Arteriovenous fistula  Placement Date/Time: 08/14/22 1230   Orientation: Right  Access Location: (c) Upper arm  Access Type: Arteriovenous fistula  Status Deaccessed     Stacie Glaze LPN [facility]

## 2023-01-29 NOTE — Progress Notes (Signed)
PROGRESS NOTE        PATIENT DETAILS Name: [name] Age: 62 y.o. Sex: female Date of Birth: [birth year] Admit Date: 01/23/2023 Admitting Physician Tereasa Coop, MD [id number], [name], MD  Brief Summary: Patient is a 62 y.o.  female with history of ESRD on HD-noncompliant due to social/homelessness issues-presenting with shortness of breath-found to have pulmonary edema/volume overload and subsequently admitted to the hospitalist service.  Significant events: 11/7>> admit to [facility] 11/11>> HD through AVF-multiple alarms-but tolerated well. 11/12>> TDC removed by vascular surgery  Significant studies: 11/7>> CXR: Pulmonary edema  Significant microbiology data: 11/7>> COVID/influenza/RSV PCR: Negative  Procedures: None  Consults: Allergy  Subjective: No major issues overnight.  For HD later today-hopefully her aVF will not have any issues.  TDC was removed by vascular surgery 11/12.  Claims she is working with her daughter to see if she can get her housing situation sorted out.  Objective: Vitals: Blood pressure 135/65, pulse 75, temperature 97.7 F (36.5 C), temperature source Oral, resp. rate 18, height 5\' 7"  (1.702 m), weight 50.5 kg, SpO2 98%.   Exam: Gen Exam:Alert awake-not in any distress HEENT:atraumatic, normocephalic Chest: B/L clear to auscultation anteriorly CVS:S1S2 regular Abdomen:soft non tender, non distended Extremities:no edema Neurology: Non focal Skin: no rash  Pertinent Labs/Radiology:    Latest Ref Rng & Units 01/29/2023    2:39 AM 01/28/2023    4:12 AM 01/27/2023    4:34 AM  CBC  WBC 4.0 - 10.5 K/uL 7.2  7.3  8.2   Hemoglobin 12.0 - 15.0 g/dL 7.9  8.4  7.8   Hematocrit 36.0 - 46.0 % 24.5  25.2  24.4   Platelets 150 - 400 K/uL 264  302  320     Lab Results  Component Value Date   NA 137 01/27/2023   K 3.8 01/27/2023   CL 108 01/27/2023   CO2 16 (L) 01/27/2023      Assessment/Plan: Acute on  chronic HFpEF due to noncompliance with hemodialysis Improved after HD Still makes urine-initially on IV Lasix but has been switched to daily Demadex dosing. Volume status stable On room air  ESRD on HD Noncompliant due to social/homelessness Tolerated HD through aVF 11/11 (although multiple alarms w HD)-plan is to see if she can tolerate HD through aVF today. TDC removed by vascular surgery on 11/12 Will need to discuss with nephrology/renal navigator/TOC team-regarding her housing situation.  Per patient-she is working on getting this sorted out with family/her friend.  COPD Not in exacerbation Bronchodilators  Normocytic anemia Primarily due to CKD/ESRD-noncompliance to Aranesp Denies any overt GI bleeding-no melena/hematochezia Hb stable after 2 units of PRBC on 11/8  Hypokalemia Repleted.  HLD Statin  HTN BP already better with volume overload HD Continue to monitor  Homelessness Social work eval prior to discharge.  Underweight: Estimated body mass index is 17.44 kg/m as calculated from the following:   Height as of this encounter: 5\' 7"  (1.702 m).   Weight as of this encounter: 50.5 kg.   Code status:   Code Status: Full Code   DVT Prophylaxis: SCDs Start: 01/24/23 0110   Family Communication: None at bedside   Disposition Plan: Status is: Inpatient Remains inpatient appropriate because: Severity of anemia   Planned Discharge Destination:Home   Diet: Diet Order  Diet regular Room service appropriate? Yes; Fluid consistency: Thin; Fluid restriction: 1200 mL Fluid  Diet effective now                     Antimicrobial agents: Anti-infectives (From admission, onward)    None        MEDICATIONS: Scheduled Meds:  atorvastatin  10 mg Oral Daily   busPIRone  10 mg Oral BID   Chlorhexidine Gluconate Cloth  6 each Topical Q0600   darbepoetin (ARANESP) injection - NON-DIALYSIS  60 mcg Subcutaneous Q Sat-1800   ferrous sulfate   325 mg Oral Q breakfast   folic acid  1 mg Oral Daily   gabapentin  100 mg Oral TID   guaiFENesin  600 mg Oral BID   pantoprazole  40 mg Oral Daily   polyethylene glycol  17 g Oral Daily   sodium bicarbonate  650 mg Oral TID   torsemide  100 mg Oral Daily   vitamin B-12  100 mcg Oral Daily   Continuous Infusions: PRN Meds:.acetaminophen, hydrALAZINE, hydrOXYzine, ipratropium-albuterol, lidocaine-prilocaine, ondansetron **OR** ondansetron (ZOFRAN) IV, senna-docusate, simethicone, traZODone   I have personally reviewed following labs and imaging studies  LABORATORY DATA: CBC: Recent Labs  Lab 01/23/23 2256 01/23/23 2306 01/25/23 0408 01/26/23 0317 01/27/23 0434 01/28/23 0412 01/29/23 0239  WBC 5.6  --  7.7 7.2 8.2 7.3 7.2  NEUTROABS 2.9  --   --   --   --   --   --   HGB 4.9*   < > 8.0* 8.0* 7.8* 8.4* 7.9*  HCT 15.6*   < > 24.0* 24.4* 24.4* 25.2* 24.5*  MCV 104.7*  --  90.6 93.5 95.7 94.0 96.8  PLT 382  --  352 327 320 302 264   < > = values in this interval not displayed.    Basic Metabolic Panel: Recent Labs  Lab 01/23/23 2256 01/23/23 2306 01/24/23 0739 01/25/23 0408 01/25/23 1054 01/26/23 0317 01/27/23 1307  NA 136 137 137 137  --  139 137  K 3.7 3.8 4.2 2.5*  --  3.2* 3.8  CL 109 113* 111 108  --  112* 108  CO2 13*  --  11* 19*  --  18* 16*  GLUCOSE 104* 98 172* 95  --  127* 92  BUN 71* 82* 67* 50*  --  54* 62*  CREATININE 5.91* 6.30* 5.86* 4.59*  --  4.67* 4.90*  CALCIUM 7.5*  --  7.6* 7.9*  --  7.8* 8.3*  PHOS  --   --   --   --  4.5 4.6 4.0    GFR: Estimated Creatinine Clearance: 9.5 mL/min (A) (by C-G formula based on SCr of 4.9 mg/dL (H)).  Liver Function Tests: Recent Labs  Lab 01/23/23 2256 01/24/23 0739 01/25/23 0408 01/26/23 0317 01/27/23 1307  AST 31 26 24   --   --   ALT 22 21 20   --   --   ALKPHOS 118 116 99  --   --   BILITOT 0.5 0.8 0.7  --   --   PROT 6.3* 6.5 5.8*  --   --   ALBUMIN 2.8* 2.7* 2.4* 2.3* 2.4*   No results for  input(s): "LIPASE", "AMYLASE" in the last 168 hours. No results for input(s): "AMMONIA" in the last 168 hours.  Coagulation Profile: No results for input(s): "INR", "PROTIME" in the last 168 hours.  Cardiac Enzymes: No results for input(s): "CKTOTAL", "CKMB", "CKMBINDEX", "TROPONINI" in the last  168 hours.  BNP (last 3 results) No results for input(s): "PROBNP" in the last 8760 hours.  Lipid Profile: No results for input(s): "CHOL", "HDL", "LDLCALC", "TRIG", "CHOLHDL", "LDLDIRECT" in the last 72 hours.  Thyroid Function Tests: No results for input(s): "TSH", "T4TOTAL", "FREET4", "T3FREE", "THYROIDAB" in the last 72 hours.  Anemia Panel: No results for input(s): "VITAMINB12", "FOLATE", "FERRITIN", "TIBC", "IRON", "RETICCTPCT" in the last 72 hours.   Urine analysis:    Component Value Date/Time   COLORURINE YELLOW 10/25/2022 2015   APPEARANCEUR HAZY (A) 10/25/2022 2015   LABSPEC 1.009 10/25/2022 2015   PHURINE 5.0 10/25/2022 2015   GLUCOSEU 50 (A) 10/25/2022 2015   HGBUR LARGE (A) 10/25/2022 2015   BILIRUBINUR NEGATIVE 10/25/2022 2015   KETONESUR NEGATIVE 10/25/2022 2015   PROTEINUR 100 (A) 10/25/2022 2015   UROBILINOGEN 0.2 03/02/2010 2027   NITRITE POSITIVE (A) 10/25/2022 2015   LEUKOCYTESUR MODERATE (A) 10/25/2022 2015    Sepsis Labs: Lactic Acid, Venous    Component Value Date/Time   LATICACIDVEN 1.0 10/25/2022 2017    MICROBIOLOGY: Recent Results (from the past 240 hour(s))  Resp panel by RT-PCR (RSV, Flu A&B, Covid) Anterior Nasal Swab     Status: None   Collection Time: 01/23/23 10:51 PM   Specimen: Anterior Nasal Swab  Result Value Ref Range Status   SARS Coronavirus 2 by RT PCR NEGATIVE NEGATIVE Final   Influenza A by PCR NEGATIVE NEGATIVE Final   Influenza B by PCR NEGATIVE NEGATIVE Final    Comment: (NOTE) The Xpert Xpress SARS-CoV-2/FLU/RSV plus assay is intended as an aid in the diagnosis of influenza from Nasopharyngeal swab specimens and should  not be used as a sole basis for treatment. Nasal washings and aspirates are unacceptable for Xpert Xpress SARS-CoV-2/FLU/RSV testing.  Fact Sheet for Patients: BloggerCourse.com  Fact Sheet for Healthcare Providers: SeriousBroker.it  This test is not yet approved or cleared by the [place] FDA and has been authorized for detection and/or diagnosis of SARS-CoV-2 by FDA under an Emergency Use Authorization (EUA). This EUA will remain in effect (meaning this test can be used) for the duration of the COVID-19 declaration under Section 564(b)(1) of the Act, 21 U.S.C. section 360bbb-3(b)(1), unless the authorization is terminated or revoked.     Resp Syncytial Virus by PCR NEGATIVE NEGATIVE Final    Comment: (NOTE) Fact Sheet for Patients: BloggerCourse.com  Fact Sheet for Healthcare Providers: SeriousBroker.it  This test is not yet approved or cleared by the [place] FDA and has been authorized for detection and/or diagnosis of SARS-CoV-2 by FDA under an Emergency Use Authorization (EUA). This EUA will remain in effect (meaning this test can be used) for the duration of the COVID-19 declaration under Section 564(b)(1) of the Act, 21 U.S.C. section 360bbb-3(b)(1), unless the authorization is terminated or revoked.  Performed at [facility], [address], [place], [place] [address]     RADIOLOGY STUDIES/RESULTS: DG Abd 1 View  Result Date: 01/28/2023 CLINICAL DATA:  Right lower quadrant abdominal pain. EXAM: ABDOMEN - 1 VIEW COMPARISON:  12/31/2018 and chest radiograph 01/23/2023 FINDINGS: There is likely elevation of the right hemidiaphragm or persistent densities at the right lung base. Findings at the right lung base are similar to the recent chest radiograph. Large amount of stool in the abdomen. Densities in the right upper abdomen may be related to stool and  colon. Nonobstructive bowel gas pattern. Limited evaluation for renal calculi. IMPRESSION: 1. Large amount of stool in the abdomen. Nonobstructive  bowel gas pattern. 2. Densities at the right lung base. Findings are similar to the recent chest radiograph. Electronically Signed   By: Richarda Overlie M.D.   On: 01/28/2023 14:58     LOS: 5 days   Jeoffrey Massed, MD  [facility]    To contact the attending provider between 7A-7P or the covering provider during after hours 7P-7A, please log into the web site [email or web address] and access using universal [place] password for that web site. If you do not have the password, please call the hospital operator.  01/29/2023, 9:21 AM

## 2023-01-29 NOTE — Progress Notes (Signed)
Mobility Specialist Progress Note;   01/29/23 1000  Mobility  Activity Ambulated with assistance in hallway  Level of Assistance Contact guard assist, steadying assist  Assistive Device Other (Comment) (Handrails)  Distance Ambulated (ft) 325 ft  Activity Response Tolerated well  Mobility Referral Yes  $Mobility charge 1 Mobility  Mobility Specialist Start Time (ACUTE ONLY) 1000  Mobility Specialist Stop Time (ACUTE ONLY) 1020  Mobility Specialist Time Calculation (min) (ACUTE ONLY) 20 min   Pt eager for mobility. Required minG assistance during ambulation for safety. C/o legs feeling fatigued at EOS d/t not ambulating much. Otherwise asx. Pt returned back to bed with all needs met.   Caesar Bookman Mobility Specialist Please contact via SecureChat or Rehab Office [phone]

## 2023-01-29 NOTE — Progress Notes (Signed)
HD called to bring her in for dialysis, Patient refused and prefers to go in the morning- says "she wants to rest". HD team aware and agrees with plan.

## 2023-01-29 NOTE — Progress Notes (Addendum)
   01/29/23 0141  Vitals  Temp 98.2 F (36.8 C)  Temp Source Oral  BP (!) 129/58  MAP (mmHg) 76  BP Location Left Arm  BP Method Automatic  Patient Position (if appropriate) Lying  Pulse Rate Source Monitor  Resp 20   Pt refuse hemodialysis at this time. 0200hrs. She prefers later during the day. Nephrologist on call informed.

## 2023-01-29 NOTE — Progress Notes (Signed)
[facility] Progress Note   Subjective:   Non-functioning TDC removed yesterday, appreciate assistance from VVS. She is anxious about her living situation, has a camper to use but cannot afford fees at camp site. Denies SOB, CP, dizziness, nausea.   Objective Vitals:   01/28/23 2000 01/28/23 2237 01/29/23 0141 01/29/23 0834  BP:  (!) 141/82 (!) 129/58 135/65  Pulse:    75  Resp:  18 20 18   Temp:  97.9 F (36.6 C) 98.2 F (36.8 C) 97.7 F (36.5 C)  TempSrc:  Oral Oral Oral  SpO2: 98%     Weight:      Height:       Physical Exam General: Alert female in NAD Heart: RRR, no murmurs, rubs or gallops Lungs: CTA bilaterally, respirations unlabored on RA Abdomen: Soft, non-distended, +BS Extremities: RUE AVF + T/b Dialysis Access: RUE AVF + t/b  Additional Objective Labs: Basic Metabolic Panel: Recent Labs  Lab 01/25/23 0408 01/25/23 1054 01/26/23 0317 01/27/23 1307  NA 137  --  139 137  K 2.5*  --  3.2* 3.8  CL 108  --  112* 108  CO2 19*  --  18* 16*  GLUCOSE 95  --  127* 92  BUN 50*  --  54* 62*  CREATININE 4.59*  --  4.67* 4.90*  CALCIUM 7.9*  --  7.8* 8.3*  PHOS  --  4.5 4.6 4.0   Liver Function Tests: Recent Labs  Lab 01/23/23 2256 01/24/23 0739 01/25/23 0408 01/26/23 0317 01/27/23 1307  AST 31 26 24   --   --   ALT 22 21 20   --   --   ALKPHOS 118 116 99  --   --   BILITOT 0.5 0.8 0.7  --   --   PROT 6.3* 6.5 5.8*  --   --   ALBUMIN 2.8* 2.7* 2.4* 2.3* 2.4*   No results for input(s): "LIPASE", "AMYLASE" in the last 168 hours. CBC: Recent Labs  Lab 01/23/23 2256 01/23/23 2306 01/25/23 0408 01/26/23 0317 01/27/23 0434 01/28/23 0412 01/29/23 0239  WBC 5.6  --  7.7 7.2 8.2 7.3 7.2  NEUTROABS 2.9  --   --   --   --   --   --   HGB 4.9*   < > 8.0* 8.0* 7.8* 8.4* 7.9*  HCT 15.6*   < > 24.0* 24.4* 24.4* 25.2* 24.5*  MCV 104.7*  --  90.6 93.5 95.7 94.0 96.8  PLT 382  --  352 327 320 302 264   < > = values in this interval not  displayed.   Blood Culture    Component Value Date/Time   SDES URINE, CLEAN CATCH 10/25/2022 2015   SPECREQUEST  10/25/2022 2015    NONE Reflexed from [id number] Performed at [facility], [address] [place], [place] [address]    CULT (A) 10/25/2022 2015    >=100,000 COLONIES/mL KLEBSIELLA PNEUMONIAE Confirmed Extended Spectrum Beta-Lactamase Producer (ESBL).  In bloodstream infections from ESBL organisms, carbapenems are preferred over piperacillin/tazobactam. They are shown to have a lower risk of mortality.    REPTSTATUS 10/27/2022 FINAL 10/25/2022 2015    Cardiac Enzymes: No results for input(s): "CKTOTAL", "CKMB", "CKMBINDEX", "TROPONINI" in the last 168 hours. CBG: No results for input(s): "GLUCAP" in the last 168 hours. Iron Studies: No results for input(s): "IRON", "TIBC", "TRANSFERRIN", "FERRITIN" in the last 72 hours. @lablastinr3 @ Studies/Results: DG Abd 1 View  Result Date: 01/28/2023 CLINICAL DATA:  Right lower  quadrant abdominal pain. EXAM: ABDOMEN - 1 VIEW COMPARISON:  12/31/2018 and chest radiograph 01/23/2023 FINDINGS: There is likely elevation of the right hemidiaphragm or persistent densities at the right lung base. Findings at the right lung base are similar to the recent chest radiograph. Large amount of stool in the abdomen. Densities in the right upper abdomen may be related to stool and colon. Nonobstructive bowel gas pattern. Limited evaluation for renal calculi. IMPRESSION: 1. Large amount of stool in the abdomen. Nonobstructive bowel gas pattern. 2. Densities at the right lung base. Findings are similar to the recent chest radiograph. Electronically Signed   By: Richarda Overlie M.D.   On: 01/28/2023 14:58   Medications:   atorvastatin  10 mg Oral Daily   busPIRone  10 mg Oral BID   Chlorhexidine Gluconate Cloth  6 each Topical Q0600   darbepoetin (ARANESP) injection - NON-DIALYSIS  60 mcg Subcutaneous Q Sat-1800   ferrous sulfate  325 mg Oral Q breakfast    folic acid  1 mg Oral Daily   gabapentin  100 mg Oral TID   guaiFENesin  600 mg Oral BID   pantoprazole  40 mg Oral Daily   polyethylene glycol  17 g Oral Daily   sodium bicarbonate  650 mg Oral TID   torsemide  100 mg Oral Daily   vitamin B-12  100 mcg Oral Daily     Assessment/Plan:  1. Volume overload - pulmonary edema on CXR. Likely 2/2 missed HD. Improved, On RA.  Minimal UF with HD Friday d/t issue with access. Last HD 11/11, plan for HD today.  2. ESRD - Difficult situation because the patient remains homeless and does not have the ability to get reliable transportation to dialysis. She was accepted to 2 HD centers in [place] but did not attend outpatient treatments at either one. Potentially has a camper to live in but needs to find a place to park it before we can CLIP her/assist with transportation to HD. Does not currently have an outpatient center. Will be difficult to place d/t social situation.  3. Access issue - AVF alarming often last HD, may be due to patient moving but will try again tomorrow with low dose heparin. TDC reportedly not functional anyway, VVS removed TDC on 11/12. If she continues to have issues with the fistula, will request fistulogram 4. Anemia of CKD- Hgb improved to 7.9 s/p 2 units pRBC. It was 4.9 on admit. Tsat in goal. Aranesp qwk  5. Secondary hyperparathyroidism - CCa and phos in goal.  Not on binder or VDRA. Check pth. 6. HTN - BP variable. On Lasix.   7. Hypokalemia - K 2.5 on 11/9, was replaced orally and improved to 3.8. Diet liberalized.  8. Nutrition -   Alb 2.4, reports she lost a lot of weight recently but is eating more since she has been here  9. Dispo: currently homeless which prevents her from arranging HD transportation. She reports her daughter has a camper she can stay in as [place] as she promises to go to HD. Reports she plans to talk to SW about this.   Rogers Blocker, PA-C 01/29/2023, 9:13 AM  [facility] Pager: [phone]

## 2023-01-30 DIAGNOSIS — T82898A Other specified complication of vascular prosthetic devices, implants and grafts, initial encounter: Secondary | ICD-10-CM | POA: Diagnosis not present

## 2023-01-30 DIAGNOSIS — D649 Anemia, unspecified: Secondary | ICD-10-CM | POA: Diagnosis not present

## 2023-01-30 DIAGNOSIS — Z992 Dependence on renal dialysis: Secondary | ICD-10-CM | POA: Diagnosis not present

## 2023-01-30 DIAGNOSIS — I10 Essential (primary) hypertension: Secondary | ICD-10-CM | POA: Diagnosis not present

## 2023-01-30 DIAGNOSIS — N186 End stage renal disease: Secondary | ICD-10-CM | POA: Diagnosis not present

## 2023-01-30 DIAGNOSIS — F411 Generalized anxiety disorder: Secondary | ICD-10-CM | POA: Diagnosis not present

## 2023-01-30 MED ORDER — CHLORHEXIDINE GLUCONATE CLOTH 2 % EX PADS
6.0000 | MEDICATED_PAD | Freq: Every day | CUTANEOUS | Status: DC
  Administered 2023-01-30 – 2023-02-01 (×2): 6 via TOPICAL

## 2023-01-30 MED ORDER — SENNOSIDES-DOCUSATE SODIUM 8.6-50 MG PO TABS
2.0000 | ORAL_TABLET | Freq: Every day | ORAL | Status: DC
  Administered 2023-01-30: 2 via ORAL
  Filled 2023-01-30 (×2): qty 2

## 2023-01-30 NOTE — Progress Notes (Signed)
Mobility Specialist Progress Note;   01/30/23 0920  Mobility  Activity Ambulated with assistance in hallway  Level of Assistance Contact guard assist, steadying assist  Assistive Device None  Distance Ambulated (ft) 325 ft  Activity Response Tolerated well  Mobility Referral Yes  $Mobility charge 1 Mobility  Mobility Specialist Start Time (ACUTE ONLY) 0920  Mobility Specialist Stop Time (ACUTE ONLY) 0945  Mobility Specialist Time Calculation (min) (ACUTE ONLY) 25 min   Pt agreeable to mobility. Required minG assistance during ambulation for safety. C/o pain in L knee but stated "it's the weather". Displayed slight fatigue at EOS but recovers quickly once back in bed. Pt returned back to bed with all needs met.   Caesar Bookman Mobility Specialist Please contact via SecureChat or Rehab Office [phone]

## 2023-01-30 NOTE — Progress Notes (Signed)
[facility]  Patient just had her [facility] removed by vascular surgery several days ago due to nonfunctioning.  We were contacted by nephrology today due to alarming of her right arm fistula and request for fistulogram.   Objective (!) 144/60 81 98.3 F (36.8 C) (Oral) 17 98%  Intake/Output Summary (Last 24 hours) at 01/30/2023 1003 Last data filed at 01/30/2023 0300 Gross per 24 hour  Intake 480 ml  Output 1750 ml  Net -1270 ml    Right basilic vein fistula with excellent thrill  Laboratory Lab Results: Recent Labs    01/28/23 0412 01/29/23 0239  WBC 7.3 7.2  HGB 8.4* 7.9*  HCT 25.2* 24.5*  PLT 302 264   BMET Recent Labs    01/27/23 1307 01/29/23 1011  NA 137 136  K 3.8 4.5  CL 108 104  CO2 16* 20*  GLUCOSE 92 88  BUN 62* 54*  CREATININE 4.90* 4.37*  CALCIUM 8.3* 8.7*    COAG Lab Results  Component Value Date   INR 1.2 11/10/2022   INR 1.1 08/12/2022   INR 1.0 07/31/2018   No results found for: "PTT"  Assessment/Planning:  62 year old female with end-stage renal disease.  We were consulted earlier in the week to remove her [facility] given this was nonfunctioning.  We have now been asked to evaluate for possible fistulogram.  Right brachiobasilic fistula apparently has been alarming in dialysis.  On exam she has an excellent thrill.  Will plan fistulogram tomorrow although I warned the patient we may not find any technical issues that are amendable to intervention.  N.p.o. after midnight.  Consent order placed.  Cephus Shelling 01/30/2023 10:03 AM --

## 2023-01-30 NOTE — TOC Progression Note (Signed)
Transition of Care [facility]) - Progression Note    Patient Details  Name: [name] MRN: [id number] Date of Birth: 1960-05-09  Transition of Care [facility]) CM/SW Contact  Michaela Corner, [place] Phone Number: 01/30/2023, 2:03 PM  Clinical Narrative:    CSW called pt's daughter, [name] to gain insight on pt living arrangements following discharge. [name] stated that her and her sister are trying to gather more information on the cost of a permanent campsite. She stated the lowest price she has found was $1100. [name] is not able to provide financial assistance at this time. CSW was given permission to contact Marsha's sister, [name] [phone].      Barriers to Discharge: Continued Medical Work up, Homeless with medical needs  Expected Discharge Plan and Services In-house Referral: Clinical Social Work     Living arrangements for the past 2 months: Hotel/Motel, No permanent address, Homeless                                       Social Determinants of Health (SDOH) Interventions SDOH Screenings   Food Insecurity: No Food Insecurity (01/24/2023)  Recent Concern: Food Insecurity - Food Insecurity Present (11/10/2022)  Housing: Patient Unable To Answer (01/24/2023)  Transportation Needs: Unmet Transportation Needs (01/24/2023)  Utilities: At Risk (01/24/2023)  Depression (PHQ2-9): Medium Risk (01/21/2022)  Tobacco Use: High Risk (01/24/2023)    Readmission Risk Interventions    01/28/2023   10:32 AM 01/27/2023    4:13 PM 11/13/2022    3:21 PM  Readmission Risk Prevention Plan  Transportation Screening Complete Complete   Medication Review Oceanographer) Complete Complete   PCP or Specialist appointment within 3-5 days of discharge  Complete   HRI or Home Care Consult  Complete --  SW Recovery Care/Counseling Consult  Complete   Palliative Care Screening  Not Applicable   Skilled Nursing Facility  Not Applicable

## 2023-01-30 NOTE — TOC Progression Note (Signed)
Transition of Care [facility]) - Progression Note    Patient Details  Name: [name] MRN: [id number] Date of Birth: November 25, 1960  Transition [facility] Care [facility]) CM/SW Contact  Michaela Corner, [place] Phone Number: 01/30/2023, 3:00 PM  Clinical Narrative:   Nurse contacted CSW to met pt per pts request. Pt talked about her homelessness, food needs, and stated she just "needs help". CSW explained that her dtr, [name], has been contacted and she has been trying to help her mother (see previous note for more details). Pt feels that her daughters "just dont get it". CSW got updated number [phone] from pt and will send out referrals.      Barriers to Discharge: Continued Medical Work up, Homeless with medical needs  Expected Discharge Plan and Services In-house Referral: Clinical Social Work     Living arrangements for the past 2 months: Hotel/Motel, No permanent address, Homeless                                       Social Determinants of Health (SDOH) Interventions SDOH Screenings   Food Insecurity: No Food Insecurity (01/24/2023)  Recent Concern: Food Insecurity - Food Insecurity Present (11/10/2022)  Housing: Patient Unable To Answer (01/24/2023)  Transportation Needs: Unmet Transportation Needs (01/24/2023)  Utilities: At Risk (01/24/2023)  Depression (PHQ2-9): Medium Risk (01/21/2022)  Tobacco Use: High Risk (01/24/2023)    Readmission Risk Interventions    01/28/2023   10:32 AM 01/27/2023    4:13 PM 11/13/2022    3:21 PM  Readmission Risk Prevention Plan  Transportation Screening Complete Complete   Medication Review Oceanographer) Complete Complete   PCP or Specialist appointment within 3-5 days of discharge  Complete   HRI or Home Care Consult  Complete --  SW Recovery Care/Counseling Consult  Complete   Palliative Care Screening  Not Applicable   Skilled Nursing Facility  Not Applicable

## 2023-01-30 NOTE — Progress Notes (Signed)
PROGRESS NOTE        PATIENT DETAILS Name: [name] Age: 62 y.o. Sex: female Date of Birth: 1960/05/03 Admit Date: 01/23/2023 Admitting Physician Tereasa Coop, MD [id number], [name], MD  Brief Summary: Patient is a 62 y.o.  female with history of ESRD on HD-noncompliant due to social/homelessness issues-presenting with shortness of breath-found to have pulmonary edema/volume overload and subsequently admitted to the hospitalist service.  Significant events: 11/7>> admit to [facility] 11/11>> HD through AVF-multiple alarms-but tolerated well. 11/12>> TDC removed by vascular surgery  Significant studies: 11/7>> CXR: Pulmonary edema  Significant microbiology data: 11/7>> COVID/influenza/RSV PCR: Negative  Procedures: None  Consults: Allergy  Subjective: No complaints-unfortunately she has been unable to arrange for any different living condition.  She was homeless and living on the streets before she came to the hospital.  Objective: Vitals: Blood pressure (!) 144/60, pulse 81, temperature 98.3 F (36.8 C), temperature source Oral, resp. rate 17, height 5\' 7"  (1.702 m), weight 50.6 kg, SpO2 98%.   Exam: Gen Exam:Alert awake-not in any distress HEENT:atraumatic, normocephalic Chest: B/L clear to auscultation anteriorly CVS:S1S2 regular Abdomen:soft non tender, non distended Extremities:no edema Neurology: Non focal Skin: no rash  Pertinent Labs/Radiology:    Latest Ref Rng & Units 01/29/2023    2:39 AM 01/28/2023    4:12 AM 01/27/2023    4:34 AM  CBC  WBC 4.0 - 10.5 K/uL 7.2  7.3  8.2   Hemoglobin 12.0 - 15.0 g/dL 7.9  8.4  7.8   Hematocrit 36.0 - 46.0 % 24.5  25.2  24.4   Platelets 150 - 400 K/uL 264  302  320     Lab Results  Component Value Date   NA 136 01/29/2023   K 4.5 01/29/2023   CL 104 01/29/2023   CO2 20 (L) 01/29/2023      Assessment/Plan: Acute on chronic HFpEF due to noncompliance with  hemodialysis Improved after HD Still makes urine-initially on IV Lasix but has been switched to daily Demadex dosing. Volume status stable On room air  ESRD on HD Noncompliant due to social/homelessness Tolerated HD through aVF 11/11 (although multiple alarms w HD)-per nephrology-although she tolerated HD through AVF on 11/13-she did have some difficulty-fistulogram being planned by nephrology.  TDC removed by vascular surgery on 11/12 Due to homelessness-she does not have a dedicated HD center.  She will likely have to come back to the ED for HD-per patient-she has been unable to find a living arrangement.  COPD Not in exacerbation Bronchodilators  Normocytic anemia Primarily due to CKD/ESRD-noncompliance to Aranesp Denies any overt GI bleeding-no melena/hematochezia Hb stable after 2 units of PRBC on 11/8  Hypokalemia Repleted.  HLD Statin  HTN BP already better with volume overload HD Continue to monitor  Homelessness Social work eval prior to discharge.  Underweight: Estimated body mass index is 17.47 kg/m as calculated from the following:   Height as of this encounter: 5\' 7"  (1.702 m).   Weight as of this encounter: 50.6 kg.   Code status:   Code Status: Full Code   DVT Prophylaxis: SCDs Start: 01/24/23 0110   Family Communication: None at bedside   Disposition Plan: Status is: Inpatient Remains inpatient appropriate because: Severity of anemia   Planned Discharge Destination:Home   Diet: Diet Order  Diet regular Room service appropriate? Yes; Fluid consistency: Thin; Fluid restriction: 1200 mL Fluid  Diet effective now                     Antimicrobial agents: Anti-infectives (From admission, onward)    None        MEDICATIONS: Scheduled Meds:  atorvastatin  10 mg Oral Daily   busPIRone  10 mg Oral BID   Chlorhexidine Gluconate Cloth  6 each Topical Q0600   darbepoetin (ARANESP) injection - NON-DIALYSIS  60 mcg  Subcutaneous Q Sat-1800   ferrous sulfate  325 mg Oral Q breakfast   folic acid  1 mg Oral Daily   gabapentin  100 mg Oral TID   guaiFENesin  600 mg Oral BID   pantoprazole  40 mg Oral Daily   polyethylene glycol  17 g Oral Daily   senna-docusate  2 tablet Oral QHS   sodium bicarbonate  650 mg Oral TID   torsemide  100 mg Oral Daily   vitamin B-12  100 mcg Oral Daily   Continuous Infusions: PRN Meds:.acetaminophen, hydrALAZINE, hydrOXYzine, ipratropium-albuterol, lidocaine-prilocaine, ondansetron **OR** ondansetron (ZOFRAN) IV, oxyCODONE, simethicone, traZODone   I have personally reviewed following labs and imaging studies  LABORATORY DATA: CBC: Recent Labs  Lab 01/23/23 2256 01/23/23 2306 01/25/23 0408 01/26/23 0317 01/27/23 0434 01/28/23 0412 01/29/23 0239  WBC 5.6  --  7.7 7.2 8.2 7.3 7.2  NEUTROABS 2.9  --   --   --   --   --   --   HGB 4.9*   < > 8.0* 8.0* 7.8* 8.4* 7.9*  HCT 15.6*   < > 24.0* 24.4* 24.4* 25.2* 24.5*  MCV 104.7*  --  90.6 93.5 95.7 94.0 96.8  PLT 382  --  352 327 320 302 264   < > = values in this interval not displayed.    Basic Metabolic Panel: Recent Labs  Lab 01/24/23 0739 01/25/23 0408 01/25/23 1054 01/26/23 0317 01/27/23 1307 01/29/23 1011  NA 137 137  --  139 137 136  K 4.2 2.5*  --  3.2* 3.8 4.5  CL 111 108  --  112* 108 104  CO2 11* 19*  --  18* 16* 20*  GLUCOSE 172* 95  --  127* 92 88  BUN 67* 50*  --  54* 62* 54*  CREATININE 5.86* 4.59*  --  4.67* 4.90* 4.37*  CALCIUM 7.6* 7.9*  --  7.8* 8.3* 8.7*  PHOS  --   --  4.5 4.6 4.0 3.2    GFR: Estimated Creatinine Clearance: 10.7 mL/min (A) (by C-G formula based on SCr of 4.37 mg/dL (H)).  Liver Function Tests: Recent Labs  Lab 01/23/23 2256 01/24/23 0739 01/25/23 0408 01/26/23 0317 01/27/23 1307 01/29/23 1011  AST 31 26 24   --   --   --   ALT 22 21 20   --   --   --   ALKPHOS 118 116 99  --   --   --   BILITOT 0.5 0.8 0.7  --   --   --   PROT 6.3* 6.5 5.8*  --   --    --   ALBUMIN 2.8* 2.7* 2.4* 2.3* 2.4* 2.7*   No results for input(s): "LIPASE", "AMYLASE" in the last 168 hours. No results for input(s): "AMMONIA" in the last 168 hours.  Coagulation Profile: No results for input(s): "INR", "PROTIME" in the last 168 hours.  Cardiac Enzymes: No results for input(s): "  CKTOTAL", "CKMB", "CKMBINDEX", "TROPONINI" in the last 168 hours.  BNP (last 3 results) No results for input(s): "PROBNP" in the last 8760 hours.  Lipid Profile: No results for input(s): "CHOL", "HDL", "LDLCALC", "TRIG", "CHOLHDL", "LDLDIRECT" in the last 72 hours.  Thyroid Function Tests: No results for input(s): "TSH", "T4TOTAL", "FREET4", "T3FREE", "THYROIDAB" in the last 72 hours.  Anemia Panel: No results for input(s): "VITAMINB12", "FOLATE", "FERRITIN", "TIBC", "IRON", "RETICCTPCT" in the last 72 hours.   Urine analysis:    Component Value Date/Time   COLORURINE YELLOW 10/25/2022 2015   APPEARANCEUR HAZY (A) 10/25/2022 2015   LABSPEC 1.009 10/25/2022 2015   PHURINE 5.0 10/25/2022 2015   GLUCOSEU 50 (A) 10/25/2022 2015   HGBUR LARGE (A) 10/25/2022 2015   BILIRUBINUR NEGATIVE 10/25/2022 2015   KETONESUR NEGATIVE 10/25/2022 2015   PROTEINUR 100 (A) 10/25/2022 2015   UROBILINOGEN 0.2 03/02/2010 2027   NITRITE POSITIVE (A) 10/25/2022 2015   LEUKOCYTESUR MODERATE (A) 10/25/2022 2015    Sepsis Labs: Lactic Acid, Venous    Component Value Date/Time   LATICACIDVEN 1.0 10/25/2022 2017    MICROBIOLOGY: Recent Results (from the past 240 hour(s))  Resp panel by RT-PCR (RSV, Flu A&B, Covid) Anterior Nasal Swab     Status: None   Collection Time: 01/23/23 10:51 PM   Specimen: Anterior Nasal Swab  Result Value Ref Range Status   SARS Coronavirus 2 by RT PCR NEGATIVE NEGATIVE Final   Influenza A by PCR NEGATIVE NEGATIVE Final   Influenza B by PCR NEGATIVE NEGATIVE Final    Comment: (NOTE) The Xpert Xpress SARS-CoV-2/FLU/RSV plus assay is intended as an aid in the  diagnosis of influenza from Nasopharyngeal swab specimens and should not be used as a sole basis for treatment. Nasal washings and aspirates are unacceptable for Xpert Xpress SARS-CoV-2/FLU/RSV testing.  Fact Sheet for Patients: BloggerCourse.com  Fact Sheet for Healthcare Providers: SeriousBroker.it  This test is not yet approved or cleared by the [place] FDA and has been authorized for detection and/or diagnosis of SARS-CoV-2 by FDA under an Emergency Use Authorization (EUA). This EUA will remain in effect (meaning this test can be used) for the duration of the COVID-19 declaration under Section 564(b)(1) of the Act, 21 U.S.C. section 360bbb-3(b)(1), unless the authorization is terminated or revoked.     Resp Syncytial Virus by PCR NEGATIVE NEGATIVE Final    Comment: (NOTE) Fact Sheet for Patients: BloggerCourse.com  Fact Sheet for Healthcare Providers: SeriousBroker.it  This test is not yet approved or cleared by the [place] FDA and has been authorized for detection and/or diagnosis of SARS-CoV-2 by FDA under an Emergency Use Authorization (EUA). This EUA will remain in effect (meaning this test can be used) for the duration of the COVID-19 declaration under Section 564(b)(1) of the Act, 21 U.S.C. section 360bbb-3(b)(1), unless the authorization is terminated or revoked.  Performed at [facility], [address] [place], [place] [address]     RADIOLOGY STUDIES/RESULTS: DG Abd 1 View  Result Date: 01/28/2023 CLINICAL DATA:  Right lower quadrant abdominal pain. EXAM: ABDOMEN - 1 VIEW COMPARISON:  12/31/2018 and chest radiograph 01/23/2023 FINDINGS: There is likely elevation of the right hemidiaphragm or persistent densities at the right lung base. Findings at the right lung base are similar to the recent chest radiograph. Large amount of stool in the  abdomen. Densities in the right upper abdomen may be related to stool and colon. Nonobstructive bowel gas pattern. Limited evaluation for renal calculi. IMPRESSION: 1. Large  amount of stool in the abdomen. Nonobstructive bowel gas pattern. 2. Densities at the right lung base. Findings are similar to the recent chest radiograph. Electronically Signed   By: Richarda Overlie M.D.   On: 01/28/2023 14:58     LOS: 6 days   Jeoffrey Massed, MD  [facility]    To contact the attending provider between 7A-7P or the covering provider during after hours 7P-7A, please log into the web site [email or web address] and access using universal [place] password for that web site. If you do not have the password, please call the hospital operator.  01/30/2023, 8:32 AM

## 2023-01-30 NOTE — Plan of Care (Signed)
  Problem: Education: Goal: Knowledge of General Education information will improve Description: Including pain rating scale, medication(s)/side effects and non-pharmacologic comfort measures 01/30/2023 2106 by Tonna Boehringer, RN Outcome: Progressing 01/30/2023 2105 by Tonna Boehringer, RN Outcome: Progressing   Problem: Health Behavior/Discharge Planning: Goal: Ability to manage health-related needs will improve Outcome: Progressing   Problem: Clinical Measurements: Goal: Will remain free from infection Outcome: Progressing Goal: Cardiovascular complication will be avoided Outcome: Progressing   Problem: Activity: Goal: Risk for activity intolerance will decrease Outcome: Progressing   Problem: Nutrition: Goal: Adequate nutrition will be maintained Outcome: Progressing   Problem: Pain Management: Goal: General experience of comfort will improve Outcome: Progressing

## 2023-01-30 NOTE — Progress Notes (Signed)
[name] Progress Note   Subjective:   RN reports arterial alarms throughout HD yesterday and pt felt pain in the fistula. They were able to complete HD but BFR had to be reduced to 300. Patient reports her arm is sore today but has no other concerns, denies SOB, CP, dizziness, nausea. She is still trying to find a place to park her camper.   Objective Vitals:   01/29/23 2043 01/29/23 2329 01/30/23 0512 01/30/23 0824  BP: 127/74 (!) 141/65 125/62 (!) 144/60  Pulse: 82 79 75 81  Resp: 18 18 15 17   Temp: 97.8 F (36.6 C) 98.1 F (36.7 C) 98.1 F (36.7 C) 98.3 F (36.8 C)  TempSrc: Oral Oral Axillary Oral  SpO2: 98% 98% 98%   Weight:   50.6 kg   Height:       Physical Exam General: Alert female in NAD Heart: RRR, no murmurs, rubs or gallops Lungs: CTA bilaterally, respirations unlabored on room aire Abdomen: soft, non-distended, +BS Extremities: No edema b/l lower extremities Dialysis Access:  RUE AVF + t/b, mild bruising  Additional Objective Labs: Basic Metabolic Panel: Recent Labs  Lab 01/26/23 0317 01/27/23 1307 01/29/23 1011  NA 139 137 136  K 3.2* 3.8 4.5  CL 112* 108 104  CO2 18* 16* 20*  GLUCOSE 127* 92 88  BUN 54* 62* 54*  CREATININE 4.67* 4.90* 4.37*  CALCIUM 7.8* 8.3* 8.7*  PHOS 4.6 4.0 3.2   Liver Function Tests: Recent Labs  Lab 01/23/23 2256 01/24/23 0739 01/25/23 0408 01/26/23 0317 01/27/23 1307 01/29/23 1011  AST 31 26 24   --   --   --   ALT 22 21 20   --   --   --   ALKPHOS 118 116 99  --   --   --   BILITOT 0.5 0.8 0.7  --   --   --   PROT 6.3* 6.5 5.8*  --   --   --   ALBUMIN 2.8* 2.7* 2.4* 2.3* 2.4* 2.7*   No results for input(s): "LIPASE", "AMYLASE" in the last 168 hours. CBC: Recent Labs  Lab 01/23/23 2256 01/23/23 2306 01/25/23 0408 01/26/23 0317 01/27/23 0434 01/28/23 0412 01/29/23 0239  WBC 5.6  --  7.7 7.2 8.2 7.3 7.2  NEUTROABS 2.9  --   --   --   --   --   --   HGB 4.9*   < > 8.0* 8.0* 7.8* 8.4*  7.9*  HCT 15.6*   < > 24.0* 24.4* 24.4* 25.2* 24.5*  MCV 104.7*  --  90.6 93.5 95.7 94.0 96.8  PLT 382  --  352 327 320 302 264   < > = values in this interval not displayed.   Blood Culture    Component Value Date/Time   SDES URINE, CLEAN CATCH 10/25/2022 2015   SPECREQUEST  10/25/2022 2015    NONE Reflexed from [id number] Performed at [facility], [address] [place], [place] [address]    CULT (A) 10/25/2022 2015    >=100,000 COLONIES/mL KLEBSIELLA PNEUMONIAE Confirmed Extended Spectrum Beta-Lactamase Producer (ESBL).  In bloodstream infections from ESBL organisms, carbapenems are preferred over piperacillin/tazobactam. They are shown to have a lower risk of mortality.    REPTSTATUS 10/27/2022 FINAL 10/25/2022 2015    Cardiac Enzymes: No results for input(s): "CKTOTAL", "CKMB", "CKMBINDEX", "TROPONINI" in the last 168 hours. CBG: No results for input(s): "GLUCAP" in the last 168 hours. Iron Studies: No results for input(s): "IRON", "  TIBC", "TRANSFERRIN", "FERRITIN" in the last 72 hours. @lablastinr3 @ Studies/Results: DG Abd 1 View  Result Date: 01/28/2023 CLINICAL DATA:  Right lower quadrant abdominal pain. EXAM: ABDOMEN - 1 VIEW COMPARISON:  12/31/2018 and chest radiograph 01/23/2023 FINDINGS: There is likely elevation of the right hemidiaphragm or persistent densities at the right lung base. Findings at the right lung base are similar to the recent chest radiograph. Large amount of stool in the abdomen. Densities in the right upper abdomen may be related to stool and colon. Nonobstructive bowel gas pattern. Limited evaluation for renal calculi. IMPRESSION: 1. Large amount of stool in the abdomen. Nonobstructive bowel gas pattern. 2. Densities at the right lung base. Findings are similar to the recent chest radiograph. Electronically Signed   By: Richarda Overlie M.D.   On: 01/28/2023 14:58   Medications:   atorvastatin  10 mg Oral Daily   busPIRone  10 mg Oral BID    Chlorhexidine Gluconate Cloth  6 each Topical Q0600   darbepoetin (ARANESP) injection - NON-DIALYSIS  60 mcg Subcutaneous Q Sat-1800   ferrous sulfate  325 mg Oral Q breakfast   folic acid  1 mg Oral Daily   gabapentin  100 mg Oral TID   guaiFENesin  600 mg Oral BID   pantoprazole  40 mg Oral Daily   polyethylene glycol  17 g Oral Daily   senna-docusate  2 tablet Oral QHS   sodium bicarbonate  650 mg Oral TID   torsemide  100 mg Oral Daily   vitamin B-12  100 mcg Oral Daily    Assessment/Plan: 1. Volume overload - pulmonary edema on CXR. Likely 2/2 missed HD. Improved, On RA.  2. ESRD - Difficult situation because the patient remains homeless and does not have the ability to get reliable transportation to dialysis. She was accepted to 2 HD centers in [place] but did not attend outpatient treatments at either one. Potentially has a camper to live in but needs to find a place to park it before we can CLIP her/assist with transportation to HD. Does not currently have an outpatient center. If she does not find housing before we have to discharge her, she would need to return to the ED for dialysis three times per week 3. Access issue - AVF alarming often during her past two dialysis treatments. TDC was non-functional anyway so it was already removed. Will consult VVS for fistulogram.  4. Anemia of CKD- Hgb improved to 7.9 s/p 2 units pRBC. It was 4.9 on admit. Tsat in goal. Aranesp qwk  5. Secondary hyperparathyroidism - CCa and phos in goal.  Not on binder or VDRA. Check pth. 6. HTN - BP variable. On Lasix.   7. Hypokalemia - K 2.5 on 11/9, was replaced orally and improved to 3.8. Diet liberalized.  8. Nutrition -   Alb 2.7, reports she lost a lot of weight recently but is eating more since she has been here  9. Dispo: currently homeless which prevents her from arranging HD transportation. She reports her daughter has a camper she can stay in as [place] as she promises to go to HD. Reports  she plans to talk to SW about this.   Rogers Blocker, PA-C 01/30/2023, 8:40 AM  [employer] Pager: [phone]

## 2023-01-30 NOTE — Plan of Care (Signed)
  Problem: Education: Goal: Knowledge of General Education information will improve Description: Including pain rating scale, medication(s)/side effects and non-pharmacologic comfort measures Outcome: Progressing   Problem: Clinical Measurements: Goal: Ability to maintain clinical measurements within normal limits will improve Outcome: Progressing   Problem: Activity: Goal: Risk for activity intolerance will decrease Outcome: Progressing   Problem: Elimination: Goal: Will not experience complications related to urinary retention Outcome: Progressing   

## 2023-01-31 ENCOUNTER — Ambulatory Visit (HOSPITAL_COMMUNITY): Admission: RE | Admit: 2023-01-31 | Payer: Medicaid Other | Source: Home / Self Care | Admitting: Vascular Surgery

## 2023-01-31 ENCOUNTER — Encounter (HOSPITAL_COMMUNITY)
Admission: EM | Disposition: A | Discharge status: Home / Self Care | Payer: Self-pay | Source: Home / Self Care | Attending: Internal Medicine

## 2023-01-31 ENCOUNTER — Encounter (HOSPITAL_COMMUNITY): Payer: Self-pay | Admitting: Vascular Surgery

## 2023-01-31 DIAGNOSIS — I10 Essential (primary) hypertension: Secondary | ICD-10-CM | POA: Diagnosis not present

## 2023-01-31 DIAGNOSIS — F411 Generalized anxiety disorder: Secondary | ICD-10-CM | POA: Diagnosis not present

## 2023-01-31 DIAGNOSIS — T82898A Other specified complication of vascular prosthetic devices, implants and grafts, initial encounter: Secondary | ICD-10-CM

## 2023-01-31 DIAGNOSIS — D649 Anemia, unspecified: Secondary | ICD-10-CM | POA: Diagnosis not present

## 2023-01-31 DIAGNOSIS — Z992 Dependence on renal dialysis: Secondary | ICD-10-CM | POA: Diagnosis not present

## 2023-01-31 DIAGNOSIS — N186 End stage renal disease: Secondary | ICD-10-CM | POA: Diagnosis not present

## 2023-01-31 HISTORY — PX: A/V FISTULAGRAM: CATH118298

## 2023-01-31 LAB — RENAL FUNCTION PANEL
Albumin: 2.6 g/dL — ABNORMAL LOW (ref 3.5–5.0)
Albumin: 3 g/dL — ABNORMAL LOW (ref 3.5–5.0)
Anion gap: 12 (ref 5–15)
Anion gap: 13 (ref 5–15)
BUN: 53 mg/dL — ABNORMAL HIGH (ref 8–23)
BUN: 55 mg/dL — ABNORMAL HIGH (ref 8–23)
CO2: 23 mmol/L (ref 22–32)
CO2: 23 mmol/L (ref 22–32)
Calcium: 9 mg/dL (ref 8.9–10.3)
Calcium: 9.1 mg/dL (ref 8.9–10.3)
Chloride: 105 mmol/L (ref 98–111)
Chloride: 102 mmol/L (ref 98–111)
Creatinine, Ser: 4.79 mg/dL — ABNORMAL HIGH (ref 0.44–1.00)
Creatinine, Ser: 4.83 mg/dL — ABNORMAL HIGH (ref 0.44–1.00)
GFR, Estimated: 10 mL/min — ABNORMAL LOW (ref >60)
GFR, Estimated: 10 mL/min — ABNORMAL LOW (ref >60)
Glucose, Bld: 93 mg/dL (ref 70–99)
Glucose, Bld: 101 mg/dL — ABNORMAL HIGH (ref 70–99)
Phosphorus: 4.7 mg/dL — ABNORMAL HIGH (ref 2.5–4.6)
Phosphorus: 4.5 mg/dL (ref 2.5–4.6)
Potassium: 4.6 mmol/L (ref 3.5–5.1)
Potassium: 4.2 mmol/L (ref 3.5–5.1)
Sodium: 140 mmol/L (ref 135–145)
Sodium: 138 mmol/L (ref 135–145)

## 2023-01-31 LAB — CBC
HCT: 26.7 % — ABNORMAL LOW (ref 36.0–46.0)
HCT: 29 % — ABNORMAL LOW (ref 36.0–46.0)
Hemoglobin: 8.2 g/dL — ABNORMAL LOW (ref 12.0–15.0)
Hemoglobin: 9.1 g/dL — ABNORMAL LOW (ref 12.0–15.0)
MCH: 30.8 pg (ref 26.0–34.0)
MCH: 31.7 pg (ref 26.0–34.0)
MCHC: 30.7 g/dL (ref 30.0–36.0)
MCHC: 31.4 g/dL (ref 30.0–36.0)
MCV: 100.4 fL — ABNORMAL HIGH (ref 80.0–100.0)
MCV: 101 fL — ABNORMAL HIGH (ref 80.0–100.0)
Platelets: 275 10*3/uL (ref 150–400)
Platelets: 297 10*3/uL (ref 150–400)
RBC: 2.66 MIL/uL — ABNORMAL LOW (ref 3.87–5.11)
RBC: 2.87 MIL/uL — ABNORMAL LOW (ref 3.87–5.11)
RDW: 22.4 % — ABNORMAL HIGH (ref 11.5–15.5)
RDW: 21.9 % — ABNORMAL HIGH (ref 11.5–15.5)
WBC: 6.8 10*3/uL (ref 4.0–10.5)
WBC: 6.2 10*3/uL (ref 4.0–10.5)
nRBC: 0 % (ref 0.0–0.2)
nRBC: 0 % (ref 0.0–0.2)

## 2023-01-31 SURGERY — A/V FISTULAGRAM
Anesthesia: LOCAL | Laterality: Right

## 2023-01-31 MED ORDER — LIDOCAINE HCL (PF) 1 % IJ SOLN
INTRAMUSCULAR | Status: AC
  Filled 2023-01-31: qty 30

## 2023-01-31 MED ORDER — IODIXANOL 320 MG/ML IV SOLN
INTRAVENOUS | Status: DC | PRN
  Administered 2023-01-31: 30 mL via INTRAVENOUS

## 2023-01-31 MED ORDER — HEPARIN SODIUM (PORCINE) 1000 UNIT/ML DIALYSIS: 20.0000 [IU]/kg | INTRAMUSCULAR | Status: DC | PRN

## 2023-01-31 MED ORDER — LIDOCAINE HCL (PF) 1 % IJ SOLN
INTRAMUSCULAR | Status: DC | PRN
  Administered 2023-01-31: 10 mL via INTRADERMAL

## 2023-01-31 MED ORDER — HEPARIN SODIUM (PORCINE) 1000 UNIT/ML DIALYSIS
2000.0000 [IU] | INTRAMUSCULAR | Status: AC | PRN
  Administered 2023-02-01: 2000 [IU] via INTRAVENOUS_CENTRAL
  Filled 2023-01-31: qty 2

## 2023-01-31 MED ORDER — HEPARIN (PORCINE) IN NACL 1000-0.9 UT/500ML-% IV SOLN
INTRAVENOUS | Status: DC | PRN
  Administered 2023-01-31: 500 mL

## 2023-01-31 MED ORDER — NEPRO/CARBSTEADY PO LIQD: 237.0000 mL | Freq: Two times a day (BID) | ORAL | Status: DC

## 2023-01-31 SURGICAL SUPPLY — 9 items
BAG SNAP BAND KOVER 36X36 (MISCELLANEOUS) ×1 IMPLANT
COVER DOME SNAP 22 D (MISCELLANEOUS) ×1 IMPLANT
KIT MICROPUNCTURE NIT STIFF (SHEATH) IMPLANT
PROTECTION STATION PRESSURIZED (MISCELLANEOUS) ×2
SHEATH PROBE COVER 6X72 (BAG) ×1 IMPLANT
STATION PROTECTION PRESSURIZED (MISCELLANEOUS) ×1 IMPLANT
STOPCOCK MORSE 400PSI 3WAY (MISCELLANEOUS) ×1 IMPLANT
TRAY PV CATH (CUSTOM PROCEDURE TRAY) ×1 IMPLANT
TUBING CIL FLEX 10 FLL-RA (TUBING) ×1 IMPLANT

## 2023-01-31 NOTE — Progress Notes (Signed)
Subjective: Seen in room, back from [facility]VS fistulogram, complains of arm discomfort and will not be able to use it for dialysis today.  Agrees for dialysis tomorrow  Objective Vital signs in last 24 hours: Vitals:   01/30/23 1942 01/31/23 0017 01/31/23 0300 01/31/23 0749  BP: (!) 159/67 137/62  (!) 155/86  Pulse: 88 77  71  Resp: 18 17  18   Temp: 98.2 F (36.8 C) 98.4 F (36.9 C)  98.1 F (36.7 C)  TempSrc: Oral Oral  Oral  SpO2: 98% 98%    Weight:   53 kg   Height:       Weight change: 3.4 kg  Physical Exam: GGeneral: Alert female in NAD Heart: RRR, no murmurs, rubs or gallops Lungs: CTA bilaterally, respirations unlabored on ra Abdomen: soft, nabs, nt,nd Extremities: No edema b/l lower extremities Dialysis Access:  RUE AVF + t/b, mild bruising  Problem/Plan: 1. Volume overload - pulmonary edema on CXR.  2/2 missed HD. Improved, On RA.  2. ESRD - in  [place] MWF HD but will change to TTS with dialysis tomorrow refuses HD today, Difficult situation because pt. remains homeless and does not have the ability to get reliable transportation to dialysis/was accepted to 2 HD centers in [place] but did not attend OP  Txs  at either one.  SW / Renal SW  Manage .  If she does not find housing before we have to discharge her, she would need to return to the ED for dialysis three times per week 3.  AVF Access issue -  Sp [facility] FISTULOGRAM TODAY = widely patent.   TDC was non-functional anyway so it was already removed. . 4. HTN/Vol - BP variable. On Lasix./ no excess vol  on exam   5. Anemia of CKD- Hgb improved to 9.1s/p 2 units pRBC. It was 4.9 on admit. Tsat in goal. Aranesp qwk  6. Secondary hyperparathyroidism - CCa and phos in goal.  Not on binder or VDRA. Check pth. 7. Hypokalemia - K 2.5 on 11/9, was replaced orally and improved to 3.8. Diet liberalized.  8. Nutrition -   Alb 2.7, > 3.0 reports she lost a lot of weight recently but is eating more since she has been here , had Nepro  supplement 9. Dispo: currently homeless which prevents her from arranging HD transportation. She reports her daughter has a camper she can stay in as [place] as she promises to go to HD. Reports she plans to talk to SW about this.   Lenny Pastel, PA-C [facility] [phone] 01/31/2023,12:23 PM  LOS: 7 days   Labs: Basic Metabolic Panel: Recent Labs  Lab 01/29/23 1011 01/31/23 0758 01/31/23 1120  NA 136 140 138  K 4.5 4.6 4.2  CL 104 105 102  CO2 20* 23 23  GLUCOSE 88 93 101*  BUN 54* 53* 55*  CREATININE 4.37* 4.79* 4.83*  CALCIUM 8.7* 9.0 9.1  PHOS 3.2 4.7* 4.5   Liver Function Tests: Recent Labs  Lab 01/25/23 0408 01/26/23 0317 01/29/23 1011 01/31/23 0758 01/31/23 1120  AST 24  --   --   --   --   ALT 20  --   --   --   --   ALKPHOS 99  --   --   --   --   BILITOT 0.7  --   --   --   --   PROT 5.8*  --   --   --   --  ALBUMIN 2.4*   < > 2.7* 2.6* 3.0*   < > = values in this interval not displayed.   No results for input(s): "LIPASE", "AMYLASE" in the last 168 hours. No results for input(s): "AMMONIA" in the last 168 hours. CBC: Recent Labs  Lab 01/27/23 0434 01/28/23 0412 01/29/23 0239 01/31/23 0758 01/31/23 1120  WBC 8.2 7.3 7.2 6.8 6.2  HGB 7.8* 8.4* 7.9* 8.2* 9.1*  HCT 24.4* 25.2* 24.5* 26.7* 29.0*  MCV 95.7 94.0 96.8 100.4* 101.0*  PLT 320 302 264 275 297   Cardiac Enzymes: No results for input(s): "CKTOTAL", "CKMB", "CKMBINDEX", "TROPONINI" in the last 168 hours. CBG: No results for input(s): "GLUCAP" in the last 168 hours.  Studies/Results: PERIPHERAL VASCULAR CATHETERIZATION  Result Date: 01/31/2023 Images from the original result were not included.   OPERATIVE NOTE  DATE: January 31, 2023  PROCEDURE: right brachiobasilic arteriovenous fistula cannulation under ultrasound guidance right arm fistulogram including central venogram  PRE-OPERATIVE DIAGNOSIS: Malfunctioning right arm arteriovenous fistula  POST-OPERATIVE  DIAGNOSIS: same as above  SURGEON: Cephus Shelling, MD  ANESTHESIA: local  ESTIMATED BLOOD LOSS: 5 cc  FINDING(S): Widely patent right brachiobasilic AV fistula.  No evidence of central venous stenosis.  SPECIMEN(S):  None  CONTRAST: 30 mL  INDICATIONS: [name] is a 62 y.o. female who  presents with malfunctioning right brachiobasilic arteriovenous fistula.  The patient is scheduled for right arm fistulogram.  The patient is aware the risks include but are not limited to: bleeding, infection, thrombosis of the cannulated access, and possible anaphylactic reaction to the contrast.  The patient is aware of the risks of the procedure and elects to proceed forward.  DESCRIPTION: After full informed written consent was obtained, the patient was brought back to the angiography suite and placed supine upon the angiography table.  The patient was connected to monitoring equipment.  The right arm was prepped and draped in the standard fashion for a right arm fistulogram.  Under ultrasound guidance, the fistula was evaluated, it was patent, an image was saved.  It was cannulated under US guidance with a micropuncture needle.  The microwire was advanced into the fistula and the needle was exchanged for the a microsheath, which was lodged 2 cm into the access.  The wire was removed and the sheath was connected to the IV extension tubing.  Hand injections were completed to image the access from the antecubitum up to the level of axilla.  The central venous structures were also imaged by hand injections.  We also got a reflux shot of the anastomosis.  Based on the images, this patient will need: no intervention and widely patent fistula.  A 4-0 Monocryl purse-string suture was sewn around the sheath.  The sheath was removed while tying down the suture.  A sterile bandage was applied to the puncture site.  COMPLICATIONS: None  CONDITION: Stable  Cephus Shelling, MD [facility]  Office: [phone]   Medications:   atorvastatin  10 mg Oral Daily   busPIRone  10 mg Oral BID   Chlorhexidine Gluconate Cloth  6 each Topical Q0600   Chlorhexidine Gluconate Cloth  6 each Topical Q0600   darbepoetin (ARANESP) injection - NON-DIALYSIS  60 mcg Subcutaneous Q Sat-1800   ferrous sulfate  325 mg Oral Q breakfast   folic acid  1 mg Oral Daily   gabapentin  100 mg Oral TID   pantoprazole  40 mg Oral Daily   polyethylene glycol  17 g Oral Daily   senna-docusate  2 tablet Oral QHS   sodium bicarbonate  650 mg Oral TID   torsemide  100 mg Oral Daily   vitamin B-12  100 mcg Oral Daily

## 2023-01-31 NOTE — Progress Notes (Signed)
[facility]  Subjective  - no complaints   Objective (!) 155/86 71 98.1 F (36.7 C) (Oral) 18 98% No intake or output data in the 24 hours ending 01/31/23 0924  Right brachiobasilic fistula with good thrill  Laboratory Lab Results: Recent Labs    01/29/23 0239 01/31/23 0758  WBC 7.2 6.8  HGB 7.9* 8.2*  HCT 24.5* 26.7*  PLT 264 275   BMET Recent Labs    01/29/23 1011 01/31/23 0758  NA 136 140  K 4.5 4.6  CL 104 105  CO2 20* 23  GLUCOSE 88 93  BUN 54* 53*  CREATININE 4.37* 4.79*  CALCIUM 8.7* 9.0    COAG Lab Results  Component Value Date   INR 1.2 11/10/2022   INR 1.1 08/12/2022   INR 1.0 07/31/2018   No results found for: "PTT"  Assessment/Planning:  Plan right arm fistulogram today as nephrology reports her machine is alarming during dialysis.  Risk-benefits previously discussed.  Cephus Shelling 01/31/2023 9:24 AM --

## 2023-01-31 NOTE — Plan of Care (Signed)
  Problem: Clinical Measurements: Goal: Ability to maintain clinical measurements within normal limits will improve Outcome: Progressing Goal: Diagnostic test results will improve Outcome: Progressing   Problem: Coping: Goal: Level of anxiety will decrease Outcome: Progressing   Problem: Pain Management: Goal: General experience of comfort will improve Outcome: Progressing   Problem: Safety: Goal: Ability to remain free from injury will improve Outcome: Progressing

## 2023-01-31 NOTE — Progress Notes (Signed)
PROGRESS NOTE        PATIENT DETAILS Name: [name] Age: 62 y.o. Sex: female Date of Birth: [birth year] Admit Date: 01/23/2023 Admitting Physician Tereasa Coop, MD [id number], [name], MD  Brief Summary: Patient is a 62 y.o.  female with history of ESRD on HD-noncompliant due to social/homelessness issues-presenting with shortness of breath-found to have pulmonary edema/volume overload and subsequently admitted to the hospitalist service.  Significant events: 11/7>> admit to [facility] 11/11>> HD through [facility]-multiple alarms-but tolerated well. 11/12>> TDC removed by vascular surgery  Significant studies: 11/7>> CXR: Pulmonary edema  Significant microbiology data: 11/7>> COVID/influenza/RSV PCR: Negative  Procedures: None  Consults: Allergy  Subjective: No for fistulogram today.  She has not been able to find a alternative housing arrangement-even after talking with family.  Objective: Vitals: Blood pressure (!) 155/86, pulse 71, temperature 98.1 F (36.7 C), temperature source Oral, resp. rate 18, height 5\' 7"  (1.702 m), weight 53 kg, SpO2 98%.   Exam: Gen Exam:Alert awake-not in any distress HEENT:atraumatic, normocephalic Chest: B/L clear to auscultation anteriorly CVS:S1S2 regular Abdomen:soft non tender, non distended Extremities:no edema Neurology: Non focal Skin: no rash  Pertinent Labs/Radiology:    Latest Ref Rng & Units 01/31/2023    7:58 AM 01/29/2023    2:39 AM 01/28/2023    4:12 AM  CBC  WBC 4.0 - 10.5 K/uL 6.8  7.2  7.3   Hemoglobin 12.0 - 15.0 g/dL 8.2  7.9  8.4   Hematocrit 36.0 - 46.0 % 26.7  24.5  25.2   Platelets 150 - 400 K/uL 275  264  302     Lab Results  Component Value Date   NA 140 01/31/2023   K 4.6 01/31/2023   CL 105 01/31/2023   CO2 23 01/31/2023      Assessment/Plan: Acute on chronic HFpEF due to noncompliance with hemodialysis Improved after HD Still makes urine-initially on IV Lasix but  has been switched to daily Demadex dosing. Volume status stable On room air  ESRD on HD Noncompliant due to social/homelessness Fistulogram today-as although tolerated HD through AVF-had multiple alarms throughout. Nonfunctioning TDC removed by vascular surgery 11/12 She is homeless-unfortunately-she has not been able to find a alternative housing situation even after talking to her family-she will unfortunately likely remain homeless-and hence will not be assigned a outpatient HD center-she is aware that she will have to come to the emergency room 3 times a week for hemodialysis.  She is fine with this arrangement.   No allergies/social worker following.  COPD Not in exacerbation Bronchodilators  Normocytic anemia Primarily due to CKD/ESRD-noncompliance to Aranesp Denies any overt GI bleeding-no melena/hematochezia Hb stable after 2 units of PRBC on 11/8  Hypokalemia Repleted.  HLD Statin  HTN BP already better with volume overload HD Continue to monitor  Homelessness Appreciate social work evaluation-see above.  Underweight: Estimated body mass index is 18.3 kg/m as calculated from the following:   Height as of this encounter: 5\' 7"  (1.702 m).   Weight as of this encounter: 53 kg.   Code status:   Code Status: Full Code   DVT Prophylaxis: SCDs Start: 01/24/23 0110   Family Communication: None at bedside   Disposition Plan: Status is: Inpatient Remains inpatient appropriate because: Severity of anemia   Planned Discharge Destination:Home   Diet: Diet Order  Diet NPO time specified  Diet effective midnight                     Antimicrobial agents: Anti-infectives (From admission, onward)    None        MEDICATIONS: Scheduled Meds:  atorvastatin  10 mg Oral Daily   busPIRone  10 mg Oral BID   Chlorhexidine Gluconate Cloth  6 each Topical Q0600   Chlorhexidine Gluconate Cloth  6 each Topical Q0600   darbepoetin (ARANESP)  injection - NON-DIALYSIS  60 mcg Subcutaneous Q Sat-1800   ferrous sulfate  325 mg Oral Q breakfast   folic acid  1 mg Oral Daily   gabapentin  100 mg Oral TID   pantoprazole  40 mg Oral Daily   polyethylene glycol  17 g Oral Daily   senna-docusate  2 tablet Oral QHS   sodium bicarbonate  650 mg Oral TID   torsemide  100 mg Oral Daily   vitamin B-12  100 mcg Oral Daily   Continuous Infusions: PRN Meds:.acetaminophen, heparin, heparin, hydrALAZINE, hydrOXYzine, ipratropium-albuterol, lidocaine-prilocaine, ondansetron **OR** ondansetron (ZOFRAN) IV, oxyCODONE, simethicone, traZODone   I have personally reviewed following labs and imaging studies  LABORATORY DATA: CBC: Recent Labs  Lab 01/26/23 0317 01/27/23 0434 01/28/23 0412 01/29/23 0239 01/31/23 0758  WBC 7.2 8.2 7.3 7.2 6.8  HGB 8.0* 7.8* 8.4* 7.9* 8.2*  HCT 24.4* 24.4* 25.2* 24.5* 26.7*  MCV 93.5 95.7 94.0 96.8 100.4*  PLT 327 320 302 264 275    Basic Metabolic Panel: Recent Labs  Lab 01/25/23 0408 01/25/23 1054 01/26/23 0317 01/27/23 1307 01/29/23 1011 01/31/23 0758  NA 137  --  139 137 136 140  K 2.5*  --  3.2* 3.8 4.5 4.6  CL 108  --  112* 108 104 105  CO2 19*  --  18* 16* 20* 23  GLUCOSE 95  --  127* 92 88 93  BUN 50*  --  54* 62* 54* 53*  CREATININE 4.59*  --  4.67* 4.90* 4.37* 4.79*  CALCIUM 7.9*  --  7.8* 8.3* 8.7* 9.0  PHOS  --  4.5 4.6 4.0 3.2 4.7*    GFR: Estimated Creatinine Clearance: 10.2 mL/min (A) (by C-G formula based on SCr of 4.79 mg/dL (H)).  Liver Function Tests: Recent Labs  Lab 01/25/23 0408 01/26/23 0317 01/27/23 1307 01/29/23 1011 01/31/23 0758  AST 24  --   --   --   --   ALT 20  --   --   --   --   ALKPHOS 99  --   --   --   --   BILITOT 0.7  --   --   --   --   PROT 5.8*  --   --   --   --   ALBUMIN 2.4* 2.3* 2.4* 2.7* 2.6*   No results for input(s): "LIPASE", "AMYLASE" in the last 168 hours. No results for input(s): "AMMONIA" in the last 168 hours.  Coagulation  Profile: No results for input(s): "INR", "PROTIME" in the last 168 hours.  Cardiac Enzymes: No results for input(s): "CKTOTAL", "CKMB", "CKMBINDEX", "TROPONINI" in the last 168 hours.  BNP (last 3 results) No results for input(s): "PROBNP" in the last 8760 hours.  Lipid Profile: No results for input(s): "CHOL", "HDL", "LDLCALC", "TRIG", "CHOLHDL", "LDLDIRECT" in the last 72 hours.  Thyroid Function Tests: No results for input(s): "TSH", "T4TOTAL", "FREET4", "T3FREE", "THYROIDAB" in the last 72 hours.  Anemia Panel: No  results for input(s): "VITAMINB12", "FOLATE", "FERRITIN", "TIBC", "IRON", "RETICCTPCT" in the last 72 hours.   Urine analysis:    Component Value Date/Time   COLORURINE YELLOW 10/25/2022 2015   APPEARANCEUR HAZY (A) 10/25/2022 2015   LABSPEC 1.009 10/25/2022 2015   PHURINE 5.0 10/25/2022 2015   GLUCOSEU 50 (A) 10/25/2022 2015   HGBUR LARGE (A) 10/25/2022 2015   BILIRUBINUR NEGATIVE 10/25/2022 2015   KETONESUR NEGATIVE 10/25/2022 2015   PROTEINUR 100 (A) 10/25/2022 2015   UROBILINOGEN 0.2 03/02/2010 2027   NITRITE POSITIVE (A) 10/25/2022 2015   LEUKOCYTESUR MODERATE (A) 10/25/2022 2015    Sepsis Labs: Lactic Acid, Venous    Component Value Date/Time   LATICACIDVEN 1.0 10/25/2022 2017    MICROBIOLOGY: Recent Results (from the past 240 hour(s))  Resp panel by RT-PCR (RSV, Flu A&B, Covid) Anterior Nasal Swab     Status: None   Collection Time: 01/23/23 10:51 PM   Specimen: Anterior Nasal Swab  Result Value Ref Range Status   SARS Coronavirus 2 by RT PCR NEGATIVE NEGATIVE Final   Influenza A by PCR NEGATIVE NEGATIVE Final   Influenza B by PCR NEGATIVE NEGATIVE Final    Comment: (NOTE) The Xpert Xpress SARS-CoV-2/FLU/RSV plus assay is intended as an aid in the diagnosis of influenza from Nasopharyngeal swab specimens and should not be used as a sole basis for treatment. Nasal washings and aspirates are unacceptable for Xpert Xpress  SARS-CoV-2/FLU/RSV testing.  Fact Sheet for Patients: BloggerCourse.com  Fact Sheet for Healthcare Providers: SeriousBroker.it  This test is not yet approved or cleared by the [place] FDA and has been authorized for detection and/or diagnosis of SARS-CoV-2 by FDA under an Emergency Use Authorization (EUA). This EUA will remain in effect (meaning this test can be used) for the duration of the COVID-19 declaration under Section 564(b)(1) of the Act, 21 U.S.C. section 360bbb-3(b)(1), unless the authorization is terminated or revoked.     Resp Syncytial Virus by PCR NEGATIVE NEGATIVE Final    Comment: (NOTE) Fact Sheet for Patients: BloggerCourse.com  Fact Sheet for Healthcare Providers: SeriousBroker.it  This test is not yet approved or cleared by the [place] FDA and has been authorized for detection and/or diagnosis of SARS-CoV-2 by FDA under an Emergency Use Authorization (EUA). This EUA will remain in effect (meaning this test can be used) for the duration of the COVID-19 declaration under Section 564(b)(1) of the Act, 21 U.S.C. section 360bbb-3(b)(1), unless the authorization is terminated or revoked.  Performed at [facility], [address], [place], [place] [address]     RADIOLOGY STUDIES/RESULTS: No results found.   LOS: 7 days   Jeoffrey Massed, MD  [facility]    To contact the attending provider between 7A-7P or the covering provider during after hours 7P-7A, please log into the web site [email or web address] and access using universal  password for that web site. If you do not have the password, please call the hospital operator.  01/31/2023, 9:02 AM

## 2023-01-31 NOTE — Op Note (Signed)
    OPERATIVE NOTE  DATE: January 31, 2023  PROCEDURE: right brachiobasilic arteriovenous fistula cannulation under ultrasound guidance right arm fistulogram including central venogram  PRE-OPERATIVE DIAGNOSIS: Malfunctioning right arm arteriovenous fistula   POST-OPERATIVE DIAGNOSIS: same as above   SURGEON: Cephus Shelling, MD  ANESTHESIA: local  ESTIMATED BLOOD LOSS: 5 cc  FINDING(S): Widely patent right brachiobasilic AV fistula.  No evidence of central venous stenosis.  SPECIMEN(S):  None  CONTRAST: 30 mL  INDICATIONS: [name] is a 62 y.o. female who  presents with malfunctioning right brachiobasilic arteriovenous fistula.  The patient is scheduled for right arm fistulogram.  The patient is aware the risks include but are not limited to: bleeding, infection, thrombosis of the cannulated access, and possible anaphylactic reaction to the contrast.  The patient is aware of the risks of the procedure and elects to proceed forward.  DESCRIPTION: After full informed written consent was obtained, the patient was brought back to the angiography suite and placed supine upon the angiography table.  The patient was connected to monitoring equipment.  The right arm was prepped and draped in the standard fashion for a right arm fistulogram.  Under ultrasound guidance, the fistula was evaluated, it was patent, an image was saved.  It was cannulated under US guidance with a micropuncture needle.  The microwire was advanced into the fistula and the needle was exchanged for the a microsheath, which was lodged 2 cm into the access.  The wire was removed and the sheath was connected to the IV extension tubing.  Hand injections were completed to image the access from the antecubitum up to the level of axilla.  The central venous structures were also imaged by hand injections.  We also got a reflux shot of the anastomosis.  Based on the images, this patient will need: no intervention and  widely patent fistula.  A 4-0 Monocryl purse-string suture was sewn around the sheath.  The sheath was removed while tying down the suture.  A sterile bandage was applied to the puncture site.  COMPLICATIONS: None  CONDITION: Stable  Cephus Shelling, MD [facility] Office: [phone]  Cephus Shelling   01/31/2023 10:21 AM

## 2023-01-31 NOTE — Progress Notes (Signed)
Navigator following pt's case. Pt currently does not have an out-pt HD clinic due to being d/c from last clinic due to never coming to a HD appt. Should pt be agreeable to out-pt HD and stable disposition confirmed, could attempt to re-clip pt to closest out-pt HD clinic. Will assist as needed.   Olivia Canter Renal Navigator [phone]

## 2023-01-31 NOTE — Progress Notes (Signed)
This Clinical research associate spoke with Melvenia Needles, RN who informed dialysis nurse that patient stated that she had fistulagram done today and she was not going to dialysis.  He stated patient is refusing to come.  Dialysis charge nurse informed of refusal.

## 2023-02-01 ENCOUNTER — Other Ambulatory Visit (HOSPITAL_COMMUNITY): Payer: Self-pay

## 2023-02-01 DIAGNOSIS — Z91199 Patient's noncompliance with other medical treatment and regimen due to unspecified reason: Secondary | ICD-10-CM | POA: Diagnosis not present

## 2023-02-01 DIAGNOSIS — I5032 Chronic diastolic (congestive) heart failure: Secondary | ICD-10-CM | POA: Diagnosis not present

## 2023-02-01 DIAGNOSIS — D649 Anemia, unspecified: Secondary | ICD-10-CM | POA: Diagnosis not present

## 2023-02-01 DIAGNOSIS — N186 End stage renal disease: Secondary | ICD-10-CM | POA: Diagnosis not present

## 2023-02-01 LAB — CBC
HCT: 24.3 % — ABNORMAL LOW (ref 36.0–46.0)
Hemoglobin: 7.6 g/dL — ABNORMAL LOW (ref 12.0–15.0)
MCH: 31.5 pg (ref 26.0–34.0)
MCHC: 31.3 g/dL (ref 30.0–36.0)
MCV: 100.8 fL — ABNORMAL HIGH (ref 80.0–100.0)
Platelets: 251 10*3/uL (ref 150–400)
RBC: 2.41 MIL/uL — ABNORMAL LOW (ref 3.87–5.11)
RDW: 21.2 % — ABNORMAL HIGH (ref 11.5–15.5)
WBC: 4.7 10*3/uL (ref 4.0–10.5)
nRBC: 0 % (ref 0.0–0.2)

## 2023-02-01 LAB — RENAL FUNCTION PANEL
Albumin: 2.5 g/dL — ABNORMAL LOW (ref 3.5–5.0)
Anion gap: 12 (ref 5–15)
BUN: 62 mg/dL — ABNORMAL HIGH (ref 8–23)
CO2: 19 mmol/L — ABNORMAL LOW (ref 22–32)
Calcium: 8.6 mg/dL — ABNORMAL LOW (ref 8.9–10.3)
Chloride: 102 mmol/L (ref 98–111)
Creatinine, Ser: 4.86 mg/dL — ABNORMAL HIGH (ref 0.44–1.00)
GFR, Estimated: 10 mL/min — ABNORMAL LOW (ref >60)
Glucose, Bld: 164 mg/dL — ABNORMAL HIGH (ref 70–99)
Phosphorus: 3.8 mg/dL (ref 2.5–4.6)
Potassium: 4.6 mmol/L (ref 3.5–5.1)
Sodium: 133 mmol/L — ABNORMAL LOW (ref 135–145)

## 2023-02-01 MED ORDER — BUSPIRONE HCL 10 MG PO TABS
10.0000 mg | ORAL_TABLET | Freq: Every day | ORAL | 0 refills | Status: DC
  Filled 2023-02-01: qty 60, 60d supply, fill #0

## 2023-02-01 MED ORDER — TRELEGY ELLIPTA 100-62.5-25 MCG/ACT IN AEPB
1.0000 | INHALATION_SPRAY | Freq: Every day | RESPIRATORY_TRACT | 3 refills | Status: DC
  Filled 2023-02-01: qty 60, 30d supply, fill #0

## 2023-02-01 MED ORDER — SODIUM BICARBONATE 650 MG PO TABS
650.0000 mg | ORAL_TABLET | Freq: Three times a day (TID) | ORAL | 3 refills | Status: DC
  Filled 2023-02-01: qty 90, 30d supply, fill #0

## 2023-02-01 MED ORDER — HYDROXYZINE HCL 25 MG PO TABS
25.0000 mg | ORAL_TABLET | Freq: Three times a day (TID) | ORAL | 0 refills | Status: DC | PRN
  Filled 2023-02-01: qty 90, 30d supply, fill #0

## 2023-02-01 MED ORDER — ACETAMINOPHEN 500 MG PO TABS: 1000.0000 mg | ORAL_TABLET | Freq: Two times a day (BID) | ORAL | Status: DC | PRN

## 2023-02-01 MED ORDER — FERROUS SULFATE 325 (65 FE) MG PO TABS
325.0000 mg | ORAL_TABLET | Freq: Every day | ORAL | 3 refills | Status: DC
  Filled 2023-02-01: qty 100, 100d supply, fill #0

## 2023-02-01 MED ORDER — IPRATROPIUM-ALBUTEROL 0.5-2.5 (3) MG/3ML IN SOLN
3.0000 mL | Freq: Four times a day (QID) | RESPIRATORY_TRACT | 6 refills | Status: DC | PRN
  Filled 2023-02-01: qty 360, 30d supply, fill #0

## 2023-02-01 MED ORDER — PANTOPRAZOLE SODIUM 40 MG PO TBEC
DELAYED_RELEASE_TABLET | ORAL | 1 refills | Status: DC
  Filled 2023-02-01: qty 30, 30d supply, fill #0

## 2023-02-01 MED ORDER — GABAPENTIN 100 MG PO CAPS
100.0000 mg | ORAL_CAPSULE | Freq: Three times a day (TID) | ORAL | 3 refills | Status: DC
  Filled 2023-02-01 – 2023-05-26 (×2): qty 90, 30d supply, fill #0

## 2023-02-01 MED ORDER — ALBUTEROL SULFATE HFA 108 (90 BASE) MCG/ACT IN AERS
2.0000 | INHALATION_SPRAY | Freq: Four times a day (QID) | RESPIRATORY_TRACT | 1 refills | Status: DC | PRN
  Filled 2023-02-01: qty 18, 25d supply, fill #0

## 2023-02-01 MED ORDER — TRAZODONE HCL 50 MG PO TABS
50.0000 mg | ORAL_TABLET | Freq: Every evening | ORAL | 3 refills | Status: DC | PRN
  Filled 2023-02-01: qty 30, 30d supply, fill #0

## 2023-02-01 MED ORDER — TORSEMIDE 100 MG PO TABS
100.0000 mg | ORAL_TABLET | Freq: Every day | ORAL | 2 refills | Status: DC
  Filled 2023-02-01: qty 30, 30d supply, fill #0

## 2023-02-01 MED ORDER — POLYVINYL ALCOHOL 1.4 % OP SOLN
1.0000 [drp] | OPHTHALMIC | Status: DC | PRN
  Administered 2023-02-01: 1 [drp] via OPHTHALMIC
  Filled 2023-02-01: qty 15

## 2023-02-01 MED ORDER — RENA-VITE PO TABS
1.0000 | ORAL_TABLET | Freq: Every day | ORAL | 0 refills | Status: DC
  Filled 2023-02-01: qty 100, 100d supply, fill #0

## 2023-02-01 MED ORDER — ATORVASTATIN CALCIUM 10 MG PO TABS
10.0000 mg | ORAL_TABLET | Freq: Every day | ORAL | 2 refills | Status: DC
  Filled 2023-02-01: qty 30, 30d supply, fill #0

## 2023-02-01 NOTE — Discharge Summary (Signed)
PATIENT DETAILS Name: [name] Age: 62 y.o. Sex: female Date of Birth: [birth year] MRN: [id number]. Admitting Physician: Tereasa Coop, MD ION:[id number], [name], MD  Admit Date: 01/23/2023 Discharge date: 02/01/2023  Recommendations for Outpatient Follow-up:  Follow up with PCP in 1-2 weeks  Admitted From:  Homeless  Disposition: Homeless   Discharge Condition: good  CODE STATUS:   Code Status: Full Code   Diet recommendation:  Diet Order             Diet - low sodium heart healthy           Diet regular Room service appropriate? Yes; Fluid consistency: Thin  Diet effective now                    Brief Summary: Patient is a 62 y.o.  female with history of ESRD on HD-noncompliant due to social/homelessness issues-presenting with shortness of breath-found to have pulmonary edema/volume overload and subsequently admitted to the hospitalist service.   Significant events: 11/7>> admit to [facility] 11/11>> HD through AVF-multiple alarms-but tolerated well. 11/12>> TDC removed by vascular surgery   Significant studies: 11/7>> CXR: Pulmonary edema   Significant microbiology data: 11/7>> COVID/influenza/RSV PCR: Negative   Procedures: None   Consults: Renal VVS  Hospital course by problem list Acute on chronic HFpEF due to noncompliance with hemodialysis Improved after HD Still makes urine-initially on IV Lasix but has been switched to daily Demadex dosing. Volume status stable On room air   ESRD on HD Noncompliant due to social/homelessness Nonfunctioning TDC removed by vascular surgery on 11/12 Tolerating HD through AVF-since numerous alarms throughout HD-underwent fistulogram on 11/15 which did not show any major stenosis. She unfortunately is homeless-and has not been able to arrange for any alternate living arrangements-hence she is unable to be assigned a regular outpatient dialysis unit.  Since she will remain homeless-she plans to come to the  emergency room 3 times a week for ongoing dialysis.   COPD Not in exacerbation Bronchodilators   Normocytic anemia Primarily due to CKD/ESRD-noncompliance to Aranesp Denies any overt GI bleeding-no melena/hematochezia Hb stable after 2 units of PRBC on 11/8   Hypokalemia Repleted.   HLD Statin   HTN BP already better with volume overload HD Continue to monitor   Homelessness Appreciate social work evaluation-see above.   Underweight: Estimated body mass index is 18.3 kg/m as calculated from the following:   Height as of this encounter: 5\' 7"  (1.702 m).   Weight as of this encounter: 53 kg.   Discharge Diagnoses:  Principal Problem:   Acute on chronic anemia Active Problems:   Chronic diastolic CHF (congestive heart failure) (HCC)   CHF exacerbation (HCC)   ESRD on dialysis (HCC)   Volume overload   Noncompliance with dialysis   Essential hypertension   History of COPD   Hyperlipidemia   Generalized anxiety disorder   Discharge Instructions:  Activity:  As tolerated   Discharge Instructions     Call MD for:  difficulty breathing, headache or visual disturbances   Complete by: As directed    Call MD for:  extreme fatigue   Complete by: As directed    Call MD for:  persistant dizziness or light-headedness   Complete by: As directed    Diet - low sodium heart healthy   Complete by: As directed    Discharge instructions   Complete by: As directed    Follow with Primary MD  [name], MD in 1-2  weeks  Please get a complete blood count and chemistry panel checked by your Primary MD at your next visit, and again as instructed by your Primary MD.  Get Medicines reviewed and adjusted: Please take all your medications with you for your next visit with your Primary MD  Laboratory/radiological data: Please request your Primary MD to go over all hospital tests and procedure/radiological results at the follow up, please ask your Primary MD to get all  Hospital records sent to his/her office.  In some cases, they will be blood work, cultures and biopsy results pending at the time of your discharge. Please request that your primary care M.D. follows up on these results.  Also Note the following: If you experience worsening of your admission symptoms, develop shortness of breath, life threatening emergency, suicidal or homicidal thoughts you must seek medical attention immediately by calling 911 or calling your MD immediately  if symptoms less severe.  You must read complete instructions/literature along with all the possible adverse reactions/side effects for all the Medicines you take and that have been prescribed to you. Take any new Medicines after you have completely understood and accpet all the possible adverse reactions/side effects.   Do not drive when taking Pain medications or sleeping medications (Benzodaizepines)  Do not take more than prescribed Pain, Sleep and Anxiety Medications. It is not advisable to combine anxiety,sleep and pain medications without talking with your primary care practitioner  Special Instructions: If you have smoked or chewed Tobacco  in the last 2 yrs please stop smoking, stop any regular Alcohol  and or any Recreational drug use.  Wear Seat belts while driving.  Please note: You were cared for by a hospitalist during your hospital stay. Once you are discharged, your primary care physician will handle any further medical issues. Please note that NO REFILLS for any discharge medications will be authorized once you are discharged, as it is imperative that you return to your primary care physician (or establish a relationship with a primary care physician if you do not have one) for your post hospital discharge needs so that they can reassess your need for medications and monitor your lab values.   Increase activity slowly   Complete by: As directed    No wound care   Complete by: As directed        Allergies as of 02/01/2023       Reactions   Zestril [lisinopril] Other (See Comments)   Hyperkalemia        Medication List     STOP taking these medications    furosemide 80 MG tablet Commonly known as: Lasix       TAKE these medications    acetaminophen 500 MG tablet Commonly known as: TYLENOL Take 2 tablets (1,000 mg total) by mouth 2 (two) times daily as needed for moderate pain (pain score 4-6), fever or headache.   albuterol 108 (90 Base) MCG/ACT inhaler Commonly known as: VENTOLIN HFA Inhale 2 puffs into the lungs every 6 (six) hours as needed for wheezing or shortness of breath.   atorvastatin 10 MG tablet Commonly known as: LIPITOR Take 1 tablet (10 mg total) by mouth daily.   busPIRone 10 MG tablet Commonly known as: BUSPAR Take 1 tablet (10 mg total) by mouth daily.   ferrous sulfate 325 (65 FE) MG tablet Take 1 tablet (325 mg total) by mouth daily with breakfast.   gabapentin 100 MG capsule Commonly known as: NEURONTIN Take 1 capsule (100 mg total)  by mouth 3 (three) times daily.   hydrOXYzine 25 MG tablet Commonly known as: ATARAX Take 1 tablet (25 mg total) by mouth 3 (three) times daily as needed for anxiety or itching.   ipratropium-albuterol 0.5-2.5 (3) MG/3ML Soln Commonly known as: DUONEB Take 3 mLs by nebulization every 6 (six) hours as needed (shortness of breath and wheezing).   multivitamin Tabs tablet Take 1 tablet by mouth at bedtime.   pantoprazole 40 MG tablet Commonly known as: PROTONIX Take 1 tablet (40mg ) by mouth daily   sodium bicarbonate 650 MG tablet Take 1 tablet (650 mg total) by mouth 3 (three) times daily. What changed: when to take this   torsemide 100 MG tablet Commonly known as: DEMADEX Take 1 tablet (100 mg total) by mouth daily.   traZODone 50 MG tablet Commonly known as: DESYREL Take 1 tablet (50 mg total) by mouth at bedtime as needed for sleep.   Trelegy Ellipta 100-62.5-25 MCG/ACT Aepb Generic  drug: Fluticasone-Umeclidin-Vilant Inhale 1 puff into the lungs daily.        Follow-up Information     [name], MD. Schedule an appointment as soon as possible for a visit in 1 week(s).   Specialty: Internal Medicine Contact information: [address] [place] [address] [place] [address] [phone]                Allergies  Allergen Reactions   Zestril [Lisinopril] Other (See Comments)    Hyperkalemia     Other Procedures/Studies: PERIPHERAL VASCULAR CATHETERIZATION  Result Date: 01/31/2023 Images from the original result were not included.   OPERATIVE NOTE  DATE: January 31, 2023  PROCEDURE: right brachiobasilic arteriovenous fistula cannulation under ultrasound guidance right arm fistulogram including central venogram  PRE-OPERATIVE DIAGNOSIS: Malfunctioning right arm arteriovenous fistula  POST-OPERATIVE DIAGNOSIS: same as above  SURGEON: Cephus Shelling, MD  ANESTHESIA: local  ESTIMATED BLOOD LOSS: 5 cc  FINDING(S): Widely patent right brachiobasilic AV fistula.  No evidence of central venous stenosis.  SPECIMEN(S):  None  CONTRAST: 30 mL  INDICATIONS: [name] is a 62 y.o. female who  presents with malfunctioning right brachiobasilic arteriovenous fistula.  The patient is scheduled for right arm fistulogram.  The patient is aware the risks include but are not limited to: bleeding, infection, thrombosis of the cannulated access, and possible anaphylactic reaction to the contrast.  The patient is aware of the risks of the procedure and elects to proceed forward.  DESCRIPTION: After full informed written consent was obtained, the patient was brought back to the angiography suite and placed supine upon the angiography table.  The patient was connected to monitoring equipment.  The right arm was prepped and draped in the standard fashion for a right arm fistulogram.  Under ultrasound guidance, the fistula was evaluated, it was patent, an image was saved.  It  was cannulated under US guidance with a micropuncture needle.  The microwire was advanced into the fistula and the needle was exchanged for the a microsheath, which was lodged 2 cm into the access.  The wire was removed and the sheath was connected to the IV extension tubing.  Hand injections were completed to image the access from the antecubitum up to the level of axilla.  The central venous structures were also imaged by hand injections.  We also got a reflux shot of the anastomosis.  Based on the images, this patient will need: no intervention and widely patent fistula.  A 4-0 Monocryl purse-string suture was  sewn around the sheath.  The sheath was removed while tying down the suture.  A sterile bandage was applied to the puncture site.  COMPLICATIONS: None  CONDITION: Stable  Cephus Shelling, MD [facility] Office: [phone]   DG Abd 1 View  Result Date: 01/28/2023 CLINICAL DATA:  Right lower quadrant abdominal pain. EXAM: ABDOMEN - 1 VIEW COMPARISON:  12/31/2018 and chest radiograph 01/23/2023 FINDINGS: There is likely elevation of the right hemidiaphragm or persistent densities at the right lung base. Findings at the right lung base are similar to the recent chest radiograph. Large amount of stool in the abdomen. Densities in the right upper abdomen may be related to stool and colon. Nonobstructive bowel gas pattern. Limited evaluation for renal calculi. IMPRESSION: 1. Large amount of stool in the abdomen. Nonobstructive bowel gas pattern. 2. Densities at the right lung base. Findings are similar to the recent chest radiograph. Electronically Signed   By: Richarda Overlie M.D.   On: 01/28/2023 14:58   ECHOCARDIOGRAM LIMITED  Result Date: 01/25/2023    ECHOCARDIOGRAM LIMITED REPORT   Patient Name:   [name] Date of Exam: 01/25/2023 Medical Rec #:  [id number]      Height:       67.0 in Accession #:    [id number]     Weight:       117.1 lb Date of Birth:  1961-02-22       BSA:          1.611 m Patient Age:    62 years       BP:           157/83 mmHg Patient Gender: F              HR:           80 bpm. Exam Location:  Inpatient Procedure: Limited Echo, Color Doppler and Cardiac Doppler Indications:    CHF  History:        Patient has prior history of Echocardiogram examinations, most                 recent 08/13/2022. CHF, COPD, Signs/Symptoms:Dyspnea; Risk                 Factors:Hypertension, Current Smoker and IVDU, ETOH abuse.  Sonographer:    Milbert Coulter Referring Phys: [id number] SUBRINA SUNDIL IMPRESSIONS  1. LIMITED ECHOCARDIOGRAM.  2. Left ventricular ejection fraction, by estimation, is 50 to 55%. The left ventricle has low normal function. The left ventricle has no regional wall motion abnormalities. Diastolic dysfunction not assessed as tissue Doppler was not measured.  3. Right ventricular systolic function grossly normal. The right ventricular size is grossly normal.  4. The mitral valve is grossly normal. Trivial mitral valve regurgitation. No evidence of mitral stenosis.  5. The aortic valve was not well visualized. Aortic valve regurgitation is not visualized.  6. The inferior vena cava is normal in size with greater than 50% respiratory variability, suggesting right atrial pressure of 3 mmHg. FINDINGS  Left Ventricle: Left ventricular ejection fraction, by estimation, is 50 to 55%. The left ventricle has low normal function. The left ventricle has no regional wall motion abnormalities. The left ventricular internal cavity size was normal in size. There is no left ventricular hypertrophy. Diastolic dysfunction not assessed as tissue Doppler was not measured. Right Ventricle: The right ventricular size is grossly normal. Right ventricular systolic function grossly normal. Pericardium: There is no evidence of pericardial effusion.  Mitral Valve: The mitral valve is grossly normal. Mild mitral annular calcification. Trivial mitral valve regurgitation. No evidence of  mitral valve stenosis. Tricuspid Valve: The tricuspid valve is grossly normal. Aortic Valve: The aortic valve was not well visualized. Aortic valve regurgitation is not visualized. Venous: The inferior vena cava is normal in size with greater than 50% respiratory variability, suggesting right atrial pressure of 3 mmHg. Additional Comments: Spectral Doppler performed. Color Doppler performed.  LEFT VENTRICLE PLAX 2D LVIDd:         5.10 cm LVIDs:         3.90 cm LV PW:         0.90 cm LV IVS:        0.70 cm  LEFT ATRIUM         Index LA diam:    3.70 cm 2.30 cm/m   AORTA Ao Root diam: 2.90 cm Sunit Tolia Electronically signed by Tessa Lerner Signature Date/Time: 01/25/2023/1:24:55 PM    Final    DG Chest Port 1 View  Result Date: 01/24/2023 CLINICAL DATA:  Shortness of breath, cough EXAM: PORTABLE CHEST 1 VIEW COMPARISON:  11/21/2022 FINDINGS: Right dialysis catheter tip at the cavoatrial junction, unchanged. Heart is normal size. Mediastinal contours within normal limits. Right pleural effusion and right lower lobe atelectasis or infiltrate. Small left pleural effusion with left base atelectasis. Diffuse interstitial prominence, favor interstitial edema. IMPRESSION: Bilateral effusions and airspace opacities, right greater than left. Diffuse interstitial prominence, likely edema. Electronically Signed   By: Charlett Nose M.D.   On: 01/24/2023 00:22     TODAY-DAY OF DISCHARGE:  Subjective:   [name] today has no headache,no chest abdominal pain,no new weakness tingling or numbness, feels much better wants to go home today.   Objective:   Blood pressure 139/70, pulse 86, temperature 98.7 F (37.1 C), temperature source Oral, resp. rate 18, height 5\' 7"  (1.702 m), weight 53.1 kg, SpO2 99%. No intake or output data in the 24 hours ending 02/01/23 0846 Filed Weights   01/31/23 0300 02/01/23 0500 02/01/23 0835  Weight: 53 kg 53.2 kg 53.1 kg    Exam: Awake Alert, Oriented *3, No new F.N deficits,  Normal affect [place].AT,PERRAL Supple Neck,No JVD, No cervical lymphadenopathy appriciated.  Symmetrical Chest wall movement, Good air movement bilaterally, CTAB RRR,No Gallops,Rubs or new Murmurs, No Parasternal Heave +ve B.Sounds, Abd Soft, Non tender, No organomegaly appriciated, No rebound -guarding or rigidity. No Cyanosis, Clubbing or edema, No new Rash or bruise   PERTINENT RADIOLOGIC STUDIES: PERIPHERAL VASCULAR CATHETERIZATION  Result Date: 01/31/2023 Images from the original result were not included.   OPERATIVE NOTE  DATE: January 31, 2023  PROCEDURE: right brachiobasilic arteriovenous fistula cannulation under ultrasound guidance right arm fistulogram including central venogram  PRE-OPERATIVE DIAGNOSIS: Malfunctioning right arm arteriovenous fistula  POST-OPERATIVE DIAGNOSIS: same as above  SURGEON: Cephus Shelling, MD  ANESTHESIA: local  ESTIMATED BLOOD LOSS: 5 cc  FINDING(S): Widely patent right brachiobasilic AV fistula.  No evidence of central venous stenosis.  SPECIMEN(S):  None  CONTRAST: 30 mL  INDICATIONS: [name] is a 62 y.o. female who  presents with malfunctioning right brachiobasilic arteriovenous fistula.  The patient is scheduled for right arm fistulogram.  The patient is aware the risks include but are not limited to: bleeding, infection, thrombosis of the cannulated access, and possible anaphylactic reaction to the contrast.  The patient is aware of the risks of the procedure and elects to proceed forward.  DESCRIPTION: After full  informed written consent was obtained, the patient was brought back to the angiography suite and placed supine upon the angiography table.  The patient was connected to monitoring equipment.  The right arm was prepped and draped in the standard fashion for a right arm fistulogram.  Under ultrasound guidance, the fistula was evaluated, it was patent, an image was saved.  It was cannulated under US guidance with a micropuncture needle.  The  microwire was advanced into the fistula and the needle was exchanged for the a microsheath, which was lodged 2 cm into the access.  The wire was removed and the sheath was connected to the IV extension tubing.  Hand injections were completed to image the access from the antecubitum up to the level of axilla.  The central venous structures were also imaged by hand injections.  We also got a reflux shot of the anastomosis.  Based on the images, this patient will need: no intervention and widely patent fistula.  A 4-0 Monocryl purse-string suture was sewn around the sheath.  The sheath was removed while tying down the suture.  A sterile bandage was applied to the puncture site.  COMPLICATIONS: None  CONDITION: Stable  Cephus Shelling, MD [facility] Office: [phone]     PERTINENT LAB RESULTS: CBC: Recent Labs    01/31/23 0758 01/31/23 1120  WBC 6.8 6.2  HGB 8.2* 9.1*  HCT 26.7* 29.0*  PLT 275 297   CMET CMP     Component Value Date/Time   NA 138 01/31/2023 1120   NA 137 06/06/2022 1410   K 4.2 01/31/2023 1120   CL 102 01/31/2023 1120   CO2 23 01/31/2023 1120   GLUCOSE 101 (H) 01/31/2023 1120   BUN 55 (H) 01/31/2023 1120   BUN 37 (H) 06/06/2022 1410   CREATININE 4.83 (H) 01/31/2023 1120   CREATININE 1.64 (H) 04/29/2016 1047   CALCIUM 9.1 01/31/2023 1120   CALCIUM 7.9 (L) 08/15/2022 0104   PROT 5.8 (L) 01/25/2023 0408   PROT 7.6 06/06/2022 1410   ALBUMIN 3.0 (L) 01/31/2023 1120   ALBUMIN 3.9 06/06/2022 1410   AST 24 01/25/2023 0408   ALT 20 01/25/2023 0408   ALKPHOS 99 01/25/2023 0408   BILITOT 0.7 01/25/2023 0408   BILITOT <0.2 06/06/2022 1410   EGFR 11.0 07/31/2022 0000   EGFR 12 (L) 06/06/2022 1410   GFRNONAA 10 (L) 01/31/2023 1120   GFRNONAA 35 (L) 04/29/2016 1047    GFR Estimated Creatinine Clearance: 10.1 mL/min (A) (by C-G formula based on SCr of 4.83 mg/dL (H)). No results for input(s): "LIPASE", "AMYLASE" in the last 72  hours. No results for input(s): "CKTOTAL", "CKMB", "CKMBINDEX", "TROPONINI" in the last 72 hours. Invalid input(s): "POCBNP" No results for input(s): "DDIMER" in the last 72 hours. No results for input(s): "HGBA1C" in the last 72 hours. No results for input(s): "CHOL", "HDL", "LDLCALC", "TRIG", "CHOLHDL", "LDLDIRECT" in the last 72 hours. No results for input(s): "TSH", "T4TOTAL", "T3FREE", "THYROIDAB" in the last 72 hours.  Invalid input(s): "FREET3" No results for input(s): "VITAMINB12", "FOLATE", "FERRITIN", "TIBC", "IRON", "RETICCTPCT" in the last 72 hours. Coags: No results for input(s): "INR" in the last 72 hours.  Invalid input(s): "PT" Microbiology: Recent Results (from the past 240 hour(s))  Resp panel by RT-PCR (RSV, Flu A&B, Covid) Anterior Nasal Swab     Status: None   Collection Time: 01/23/23 10:51 PM   Specimen: Anterior Nasal Swab  Result Value Ref Range Status   SARS  Coronavirus 2 by RT PCR NEGATIVE NEGATIVE Final   Influenza A by PCR NEGATIVE NEGATIVE Final   Influenza B by PCR NEGATIVE NEGATIVE Final    Comment: (NOTE) The Xpert Xpress SARS-CoV-2/FLU/RSV plus assay is intended as an aid in the diagnosis of influenza from Nasopharyngeal swab specimens and should not be used as a sole basis for treatment. Nasal washings and aspirates are unacceptable for Xpert Xpress SARS-CoV-2/FLU/RSV testing.  Fact Sheet for Patients: BloggerCourse.com  Fact Sheet for Healthcare Providers: SeriousBroker.it  This test is not yet approved or cleared by the [place] FDA and has been authorized for detection and/or diagnosis of SARS-CoV-2 by FDA under an Emergency Use Authorization (EUA). This EUA will remain in effect (meaning this test can be used) for the duration of the COVID-19 declaration under Section 564(b)(1) of the Act, 21 U.S.C. section 360bbb-3(b)(1), unless the authorization is terminated or revoked.      Resp Syncytial Virus by PCR NEGATIVE NEGATIVE Final    Comment: (NOTE) Fact Sheet for Patients: BloggerCourse.com  Fact Sheet for Healthcare Providers: SeriousBroker.it  This test is not yet approved or cleared by the [place] FDA and has been authorized for detection and/or diagnosis of SARS-CoV-2 by FDA under an Emergency Use Authorization (EUA). This EUA will remain in effect (meaning this test can be used) for the duration of the COVID-19 declaration under Section 564(b)(1) of the Act, 21 U.S.C. section 360bbb-3(b)(1), unless the authorization is terminated or revoked.  Performed at [facility], [address] [place], [place] [address]     FURTHER DISCHARGE INSTRUCTIONS:  Get Medicines reviewed and adjusted: Please take all your medications with you for your next visit with your Primary MD  Laboratory/radiological data: Please request your Primary MD to go over all hospital tests and procedure/radiological results at the follow up, please ask your Primary MD to get all Hospital records sent to his/her office.  In some cases, they will be blood work, cultures and biopsy results pending at the time of your discharge. Please request that your primary care M.D. goes through all the records of your hospital data and follows up on these results.  Also Note the following: If you experience worsening of your admission symptoms, develop shortness of breath, life threatening emergency, suicidal or homicidal thoughts you must seek medical attention immediately by calling 911 or calling your MD immediately  if symptoms less severe.  You must read complete instructions/literature along with all the possible adverse reactions/side effects for all the Medicines you take and that have been prescribed to you. Take any new Medicines after you have completely understood and accpet all the possible adverse reactions/side effects.   Do  not drive when taking Pain medications or sleeping medications (Benzodaizepines)  Do not take more than prescribed Pain, Sleep and Anxiety Medications. It is not advisable to combine anxiety,sleep and pain medications without talking with your primary care practitioner  Special Instructions: If you have smoked or chewed Tobacco  in the last 2 yrs please stop smoking, stop any regular Alcohol  and or any Recreational drug use.  Wear Seat belts while driving.  Please note: You were cared for by a hospitalist during your hospital stay. Once you are discharged, your primary care physician will handle any further medical issues. Please note that NO REFILLS for any discharge medications will be authorized once you are discharged, as it is imperative that you return to your primary care physician (or establish a relationship with a primary  care physician if you do not have one) for your post hospital discharge needs so that they can reassess your need for medications and monitor your lab values.  Total Time spent coordinating discharge including counseling, education and face to face time equals greater than 30 minutes.  SignedJeoffrey Massed 02/01/2023 8:46 AM

## 2023-02-01 NOTE — Progress Notes (Signed)
Received patient in bed to unit.  Alert and oriented.  Informed consent signed and in chart.   TX duration:3  Patient tolerated well.  Transported back to the room  Alert, without acute distress.  Hand-off given to patient's nurse.   Access used: right AVF Access issues: arm spasms caused some alarming  Total UF removed: 1L Medication(s) given: none   02/01/23 1200  Vitals  BP 123/60  MAP (mmHg) 78  BP Location Left Arm  BP Method Automatic  Patient Position (if appropriate) Lying  Pulse Rate 74  Pulse Rate Source Monitor  ECG Heart Rate 75  Resp 16  Oxygen Therapy  SpO2 98 %  O2 Device Room Air  During Treatment Monitoring  Blood Flow Rate (mL/min) 299 mL/min  Arterial Pressure (mmHg) -155.75 mmHg  Venous Pressure (mmHg) 285.24 mmHg  TMP (mmHg) 18.58 mmHg  Ultrafiltration Rate (mL/min) 512 mL/min  Dialysate Flow Rate (mL/min) 299 ml/min  Duration of HD Treatment -hour(s) 2.99 hour(s)  Cumulative Fluid Removed (mL) per Treatment  992.82  HD Safety Checks Performed Yes  Intra-Hemodialysis Comments Tx completed  Dialysis Fluid Bolus Normal Saline  Bolus Amount (mL) 300 mL    Kieana Livesay [facility]

## 2023-02-01 NOTE — TOC Transition Note (Signed)
Transition of Care [facility]) - CM/SW Discharge Note   Patient Details  Name: [name] MRN: [id number] Date of Birth: 09/28/1960  Transition of Care [facility]) CM/SW Contact:  Lawerance Sabal, RN Phone Number: 02/01/2023, 9:13 AM   Clinical Narrative:     Patient to DC today after HD.  Patient has been provided with 2 bus passes, meds to be filled through [facility] and sent home with her.  Per MD patient is aware that she will need to return to the ED for HD TTS, per handoff, patient has [facility] [place] for transport for HD.     Barriers to Discharge: Continued Medical Work up, Homeless with medical needs   Patient Goals and CMS Choice      Discharge Placement                         Discharge Plan and Services Additional resources added to the After Visit Summary for   In-house Referral: Clinical Social Work                                   Social Determinants of Health (SDOH) Interventions SDOH Screenings   Food Insecurity: No Food Insecurity (01/24/2023)  Recent Concern: Food Insecurity - Food Insecurity Present (11/10/2022)  Housing: Patient Unable To Answer (01/24/2023)  Transportation Needs: Unmet Transportation Needs (01/24/2023)  Utilities: At Risk (01/24/2023)  Depression (PHQ2-9): Medium Risk (01/21/2022)  Tobacco Use: High Risk (01/24/2023)     Readmission Risk Interventions    01/28/2023   10:32 AM 01/27/2023    4:13 PM 11/13/2022    3:21 PM  Readmission Risk Prevention Plan  Transportation Screening Complete Complete   Medication Review Oceanographer) Complete Complete   PCP or Specialist appointment within 3-5 days of discharge  Complete   HRI or Home Care Consult  Complete --  SW Recovery Care/Counseling Consult  Complete   Palliative Care Screening  Not Applicable   Skilled Nursing Facility  Not Applicable

## 2023-02-01 NOTE — Progress Notes (Signed)
Subjective: Tolerating HD using AV fistula.  16-gauge needle, no shortness of breath or chest pain.  Discussed with her need not to miss HD as outpatient.  Unfortunately no clipped outpatient center yet this needs follow-up with renal navigator Monday.  Objective Vital signs in last 24 hours: Vitals:   02/01/23 0930 02/01/23 1000 02/01/23 1030 02/01/23 1100  BP: 120/60 (!) 124/58 109/61 129/63  Pulse: 86 79 78 76  Resp: 14 12 11 12   Temp:      TempSrc:      SpO2: 99% 94% 94% 96%  Weight:      Height:       Weight change: 0.2 kg  Physical Exam: General: Alert female in NAD Heart: RRR, no murmurs, rubs or gallops Lungs: CTA bilaterally, respirations unlabored on ra Abdomen: soft, nabs, nt,nd Extremities: No edema b/l lower extremities Dialysis Access:  RUE AVF patent on HD using 16-gauge needles   Problem/Plan: 1. Volume overload - pulmonary edema on CXR.  2/2 missed HD. Improved, On RA.  2. ESRD - in  [place] MWF HD but will changed to TTS with dialysis as refuses HD yesterday secondary to arm pain post fistulogram.  Difficult situation because pt. remains homeless and does not have the ability to get reliable transportation to dialysis/was accepted to 2 HD centers in [place] but did not attend OP  Txs  at either one.  SW / Renal SW  Manage .  If she does not find housing before we have to discharge her, she would need to return to the ED for dialysis three times per week 3.  AVF Access issue -  Sp VVS FISTULOGRAM / = widely patent.  AV fistula used today 16-gauge needle successful. TDC was non-functional anyway /already removed. . 4. HTN/Vol - BP variable. On Lasix./ no excess vol  on exam   5. Anemia of CKD- Hgb 8.2, improved to 9.1s/p 2 units pRBC. It was 4.9 on admit. Tsat in goal. Aranesp qwk  6. Secondary hyperparathyroidism - CCa and phos in goal.  Not on binder or VDRA. Check pth. 7. Hypokalemia - K 2.5 on 11/9, was replaced orally and improved to 3.8. Diet liberalized.   8. Nutrition -   Alb 2.7, > 3.0> 2.6 reports she lost a lot of weight recently but is eating more since she has been here , had Nepro supplement 9. Dispo: currently homeless which prevents her from arranging HD transportation. She reports her daughter has a Electronics engineer in [place] area.  She can stay in as [place] as she promises to go to HD. Needing renal navigator clipping   Lenny Pastel, PA-C [facility] Beeper [phone] 02/01/2023,11:32 AM  LOS: 8 days   Labs: Basic Metabolic Panel: Recent Labs  Lab 01/31/23 0758 01/31/23 1120 02/01/23 0847  NA 140 138 133*  K 4.6 4.2 4.6  CL 105 102 102  CO2 23 23 19*  GLUCOSE 93 101* 164*  BUN 53* 55* 62*  CREATININE 4.79* 4.83* 4.86*  CALCIUM 9.0 9.1 8.6*  PHOS 4.7* 4.5 3.8   Liver Function Tests: Recent Labs  Lab 01/31/23 0758 01/31/23 1120 02/01/23 0847  ALBUMIN 2.6* 3.0* 2.5*   No results for input(s): "LIPASE", "AMYLASE" in the last 168 hours. No results for input(s): "AMMONIA" in the last 168 hours. CBC: Recent Labs  Lab 01/28/23 0412 01/29/23 0239 01/31/23 0758 01/31/23 1120 02/01/23 0845  WBC 7.3 7.2 6.8 6.2 4.7  HGB 8.4* 7.9* 8.2* 9.1* 7.6*  HCT 25.2* 24.5*  26.7* 29.0* 24.3*  MCV 94.0 96.8 100.4* 101.0* 100.8*  PLT 302 264 275 297 251   Cardiac Enzymes: No results for input(s): "CKTOTAL", "CKMB", "CKMBINDEX", "TROPONINI" in the last 168 hours. CBG: No results for input(s): "GLUCAP" in the last 168 hours.  Studies/Results: PERIPHERAL VASCULAR CATHETERIZATION  Result Date: 01/31/2023 Images from the original result were not included.   OPERATIVE NOTE  DATE: January 31, 2023  PROCEDURE: right brachiobasilic arteriovenous fistula cannulation under ultrasound guidance right arm fistulogram including central venogram  PRE-OPERATIVE DIAGNOSIS: Malfunctioning right arm arteriovenous fistula  POST-OPERATIVE DIAGNOSIS: same as above  SURGEON: Cephus Shelling, MD  ANESTHESIA: local  ESTIMATED BLOOD LOSS: 5  cc  FINDING(S): Widely patent right brachiobasilic AV fistula.  No evidence of central venous stenosis.  SPECIMEN(S):  None  CONTRAST: 30 mL  INDICATIONS: [name] is a 62 y.o. female who  presents with malfunctioning right brachiobasilic arteriovenous fistula.  The patient is scheduled for right arm fistulogram.  The patient is aware the risks include but are not limited to: bleeding, infection, thrombosis of the cannulated access, and possible anaphylactic reaction to the contrast.  The patient is aware of the risks of the procedure and elects to proceed forward.  DESCRIPTION: After full informed written consent was obtained, the patient was brought back to the angiography suite and placed supine upon the angiography table.  The patient was connected to monitoring equipment.  The right arm was prepped and draped in the standard fashion for a right arm fistulogram.  Under ultrasound guidance, the fistula was evaluated, it was patent, an image was saved.  It was cannulated under US guidance with a micropuncture needle.  The microwire was advanced into the fistula and the needle was exchanged for the a microsheath, which was lodged 2 cm into the access.  The wire was removed and the sheath was connected to the IV extension tubing.  Hand injections were completed to image the access from the antecubitum up to the level of axilla.  The central venous structures were also imaged by hand injections.  We also got a reflux shot of the anastomosis.  Based on the images, this patient will need: no intervention and widely patent fistula.  A 4-0 Monocryl purse-string suture was sewn around the sheath.  The sheath was removed while tying down the suture.  A sterile bandage was applied to the puncture site.  COMPLICATIONS: None  CONDITION: Stable  Cephus Shelling, MD [facility] Office: [phone]   Medications:   atorvastatin  10 mg Oral Daily   busPIRone  10 mg Oral BID    Chlorhexidine Gluconate Cloth  6 each Topical Q0600   Chlorhexidine Gluconate Cloth  6 each Topical Q0600   darbepoetin (ARANESP) injection - NON-DIALYSIS  60 mcg Subcutaneous Q Sat-1800   feeding supplement (NEPRO CARB STEADY)  237 mL Oral BID BM   ferrous sulfate  325 mg Oral Q breakfast   folic acid  1 mg Oral Daily   gabapentin  100 mg Oral TID   pantoprazole  40 mg Oral Daily   polyethylene glycol  17 g Oral Daily   senna-docusate  2 tablet Oral QHS   sodium bicarbonate  650 mg Oral TID   torsemide  100 mg Oral Daily   vitamin B-12  100 mcg Oral Daily

## 2023-02-03 ENCOUNTER — Telehealth: Payer: Self-pay
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
## 2023-02-03 NOTE — Progress Notes (Signed)
Late Note Entry- Nov. 18, 2024  Pt was d/c from hospital this past weekend. Contacted pt at [phone]. Pt advised navigator that she is currently staying with ex-husband at [address], [place]. Pt is hoping to be able to stay there for a few weeks until she can hopefully make some [place]-term plans to stay in daughter's camper (once it is known where camper can be parked). Pt is requesting clinic placement at [facility] (pt has been there in the past). Pt states she can hopefully get transportation through her medicaid plan. Referral submitted to [facility] admissions today for review. Advised pt that navigator will provide update on referral as information is received.   Olivia Canter Renal Navigator [phone]

## 2023-02-05 NOTE — Progress Notes (Signed)
Late Note Entry- Nov. 20, 204  Advised this morning that pt is an area denial for any [facility] out-pt clinic. Spoke to pt via phone to make her aware of this info. Inquired if pt would want navigator to make a referral to any clinics in [facility] or [place]. Pt states no due to still working on a permanent housing option. Pt aware that she will need to return to a local ED for assessment when next HD needed. Pt voices understanding. Will provide update to nephrology staff.   Olivia Canter Renal Navigator [phone]

## 2023-02-10 ENCOUNTER — Ambulatory Visit: Payer: Medicaid Other | Attending: Physician Assistant | Admitting: Physician Assistant

## 2023-02-10 ENCOUNTER — Other Ambulatory Visit: Payer: Self-pay

## 2023-02-10 ENCOUNTER — Emergency Department (HOSPITAL_COMMUNITY)
Admission: EM | Admit: 2023-02-10 | Discharge: 2023-02-11 | Disposition: A | Discharge status: Home / Self Care | Payer: Medicaid Other | Attending: Emergency Medicine | Admitting: Emergency Medicine

## 2023-02-10 ENCOUNTER — Encounter (HOSPITAL_COMMUNITY): Payer: Self-pay

## 2023-02-10 ENCOUNTER — Telehealth: Payer: Self-pay

## 2023-02-10 ENCOUNTER — Inpatient Hospital Stay: Payer: Medicaid Other | Admitting: Physician Assistant

## 2023-02-10 ENCOUNTER — Emergency Department (HOSPITAL_COMMUNITY): Payer: Medicaid Other

## 2023-02-10 ENCOUNTER — Encounter: Payer: Self-pay | Admitting: Physician Assistant

## 2023-02-10 VITALS — BP 158/70 | HR 79 | Wt 124.2 lb

## 2023-02-10 DIAGNOSIS — F32A Depression, unspecified: Secondary | ICD-10-CM

## 2023-02-10 DIAGNOSIS — D631 Anemia in chronic kidney disease: Secondary | ICD-10-CM | POA: Insufficient documentation

## 2023-02-10 DIAGNOSIS — M545 Low back pain, unspecified: Secondary | ICD-10-CM

## 2023-02-10 DIAGNOSIS — R600 Localized edema: Secondary | ICD-10-CM | POA: Insufficient documentation

## 2023-02-10 DIAGNOSIS — E875 Hyperkalemia: Secondary | ICD-10-CM | POA: Diagnosis not present

## 2023-02-10 DIAGNOSIS — Z5900 Homelessness unspecified: Secondary | ICD-10-CM

## 2023-02-10 DIAGNOSIS — J449 Chronic obstructive pulmonary disease, unspecified: Secondary | ICD-10-CM | POA: Diagnosis not present

## 2023-02-10 DIAGNOSIS — E1122 Type 2 diabetes mellitus with diabetic chronic kidney disease: Secondary | ICD-10-CM | POA: Diagnosis not present

## 2023-02-10 DIAGNOSIS — R0602 Shortness of breath: Secondary | ICD-10-CM | POA: Diagnosis present

## 2023-02-10 DIAGNOSIS — Z139 Encounter for screening, unspecified: Secondary | ICD-10-CM

## 2023-02-10 DIAGNOSIS — Z7951 Long term (current) use of inhaled steroids: Secondary | ICD-10-CM | POA: Diagnosis not present

## 2023-02-10 DIAGNOSIS — Z91199 Patient's noncompliance with other medical treatment and regimen due to unspecified reason: Secondary | ICD-10-CM

## 2023-02-10 DIAGNOSIS — N186 End stage renal disease: Secondary | ICD-10-CM | POA: Insufficient documentation

## 2023-02-10 DIAGNOSIS — F419 Anxiety disorder, unspecified: Secondary | ICD-10-CM

## 2023-02-10 DIAGNOSIS — F17210 Nicotine dependence, cigarettes, uncomplicated: Secondary | ICD-10-CM | POA: Diagnosis not present

## 2023-02-10 DIAGNOSIS — Z992 Dependence on renal dialysis: Secondary | ICD-10-CM | POA: Insufficient documentation

## 2023-02-10 DIAGNOSIS — F1994 Other psychoactive substance use, unspecified with psychoactive substance-induced mood disorder: Secondary | ICD-10-CM

## 2023-02-10 DIAGNOSIS — H00015 Hordeolum externum left lower eyelid: Secondary | ICD-10-CM

## 2023-02-10 DIAGNOSIS — Z09 Encounter for follow-up examination after completed treatment for conditions other than malignant neoplasm: Secondary | ICD-10-CM

## 2023-02-10 DIAGNOSIS — M546 Pain in thoracic spine: Secondary | ICD-10-CM

## 2023-02-10 LAB — BASIC METABOLIC PANEL
Anion gap: 11 (ref 5–15)
BUN: 73 mg/dL — ABNORMAL HIGH (ref 8–23)
CO2: 15 mmol/L — ABNORMAL LOW (ref 22–32)
Calcium: 8.5 mg/dL — ABNORMAL LOW (ref 8.9–10.3)
Chloride: 114 mmol/L — ABNORMAL HIGH (ref 98–111)
Creatinine, Ser: 5.07 mg/dL — ABNORMAL HIGH (ref 0.44–1.00)
GFR, Estimated: 9 mL/min — ABNORMAL LOW (ref >60)
Glucose, Bld: 107 mg/dL — ABNORMAL HIGH (ref 70–99)
Potassium: 5.4 mmol/L — ABNORMAL HIGH (ref 3.5–5.1)
Sodium: 140 mmol/L (ref 135–145)

## 2023-02-10 LAB — CBC
HCT: 24.2 % — ABNORMAL LOW (ref 36.0–46.0)
Hemoglobin: 7.6 g/dL — ABNORMAL LOW (ref 12.0–15.0)
MCH: 32.1 pg (ref 26.0–34.0)
MCHC: 31.4 g/dL (ref 30.0–36.0)
MCV: 102.1 fL — ABNORMAL HIGH (ref 80.0–100.0)
Platelets: 367 10*3/uL (ref 150–400)
RBC: 2.37 MIL/uL — ABNORMAL LOW (ref 3.87–5.11)
RDW: 18.3 % — ABNORMAL HIGH (ref 11.5–15.5)
WBC: 4.7 10*3/uL (ref 4.0–10.5)
nRBC: 0 % (ref 0.0–0.2)

## 2023-02-10 LAB — TROPONIN I (HIGH SENSITIVITY): Troponin I (High Sensitivity): 12 ng/L (ref <18)

## 2023-02-10 MED ORDER — OXYCODONE-ACETAMINOPHEN 5-325 MG PO TABS
1.0000 | ORAL_TABLET | Freq: Once | ORAL | Status: AC
  Administered 2023-02-10: 1 via ORAL
  Filled 2023-02-10: qty 1

## 2023-02-10 MED ORDER — CHLORHEXIDINE GLUCONATE CLOTH 2 % EX PADS
6.0000 | MEDICATED_PAD | Freq: Every day | CUTANEOUS | Status: DC
Start: 2023-02-11 — End: 2023-02-11

## 2023-02-10 MED ORDER — DARBEPOETIN ALFA 150 MCG/0.3ML IJ SOSY
150.0000 ug | PREFILLED_SYRINGE | Freq: Once | INTRAMUSCULAR | Status: DC
  Filled 2023-02-10: qty 0.3

## 2023-02-10 MED ORDER — BUSPIRONE HCL 10 MG PO TABS: 10.0000 mg | ORAL_TABLET | Freq: Two times a day (BID) | ORAL | 3 refills | Status: DC

## 2023-02-10 MED ORDER — GENTAMICIN SULFATE 0.3 % OP SOLN: 1.0000 [drp] | Freq: Three times a day (TID) | OPHTHALMIC | 0 refills | Status: DC

## 2023-02-10 NOTE — ED Provider Notes (Signed)
[place] EMERGENCY DEPARTMENT AT [facility] Provider Note   CSN: [id number] Arrival date & time: 02/10/23  1248     History No chief complaint on file.   [name] is a 62 y.o. female with history of alcohol abuse, COPD, GERD, homelessness, end-stage renal disease on dialysis, chronic leg pain and chronic back pain.  Patient presents to ED for evaluation.  She reports that she has not been dialyzed since she was last discharged from the hospital on 11/16.  She went to her PCPs office today for an appointment and her PCP noted that she was volume overloaded and sent her over to the ED for evaluation/hemodialysis.  She reports that she is not compliant on hemodialysis because "I know when I need it and I do not need to do it every 3 weeks".  She reports that she has had shortness of breath, intermittent chest discomfort as well as lower extremity swelling.  She is also complaining of abdominal distention.  Denies alcohol use.  States that she does still smoke cigarettes.  Denies nausea, vomiting or fevers at home.  HPI     Home Medications Prior to Admission medications   Medication Sig Start Date End Date Taking? Authorizing Provider  acetaminophen (TYLENOL) 500 MG tablet Take 2 tablets (1,000 mg total) by mouth 2 (two) times daily as needed for moderate pain (pain score 4-6), fever or headache. 02/01/23   Ghimire, Werner Lean, MD  albuterol (VENTOLIN HFA) 108 (90 Base) MCG/ACT inhaler Inhale 2 puffs into the lungs every 6 (six) hours as needed for wheezing or shortness of breath. 02/01/23   Ghimire, Werner Lean, MD  atorvastatin (LIPITOR) 10 MG tablet Take 1 tablet (10 mg total) by mouth daily. 02/01/23   Ghimire, Werner Lean, MD  busPIRone (BUSPAR) 10 MG tablet Take 1 tablet (10 mg total) by mouth 2 (two) times daily. 02/10/23   Anders Simmonds, PA-C  ferrous sulfate 325 (65 FE) MG tablet Take 1 tablet (325 mg total) by mouth daily with breakfast. 02/01/23   Ghimire, Werner Lean, MD  Fluticasone-Umeclidin-Vilant (TRELEGY ELLIPTA) 100-62.5-25 MCG/ACT AEPB Inhale 1 puff into the lungs daily. 02/01/23   Ghimire, Werner Lean, MD  gabapentin (NEURONTIN) 100 MG capsule Take 1 capsule (100 mg total) by mouth 3 (three) times daily. 02/01/23   Ghimire, Werner Lean, MD  gentamicin (GARAMYCIN) 0.3 % ophthalmic solution Place 1 drop into the left eye 3 (three) times daily. 02/10/23   Anders Simmonds, PA-C  hydrOXYzine (ATARAX) 25 MG tablet Take 1 tablet (25 mg total) by mouth 3 (three) times daily as needed for anxiety or itching. 02/01/23   Ghimire, Werner Lean, MD  ipratropium-albuterol (DUONEB) 0.5-2.5 (3) MG/3ML SOLN Take 3 mLs by nebulization every 6 (six) hours as needed (shortness of breath and wheezing). 02/01/23   Ghimire, Werner Lean, MD  multivitamin (RENA-VIT) TABS tablet Take 1 tablet by mouth at bedtime. 02/01/23   Ghimire, Werner Lean, MD  pantoprazole (PROTONIX) 40 MG tablet Take 1 tablet (40mg ) by mouth daily 02/01/23   Ghimire, Werner Lean, MD  sodium bicarbonate 650 MG tablet Take 1 tablet (650 mg total) by mouth 3 (three) times daily. 02/01/23   Ghimire, Werner Lean, MD  torsemide (DEMADEX) 100 MG tablet Take 1 tablet (100 mg total) by mouth daily. 02/01/23   Ghimire, Werner Lean, MD  traZODone (DESYREL) 50 MG tablet Take 1 tablet (50 mg total) by mouth at bedtime as needed for sleep. 02/01/23   Ghimire,  Werner Lean, MD      Allergies    Zestril [lisinopril]    Review of Systems   Review of Systems  Respiratory:  Positive for shortness of breath.   Cardiovascular:  Positive for chest pain and leg swelling.  Gastrointestinal:  Positive for abdominal distention. Negative for abdominal pain, nausea and vomiting.  Musculoskeletal:  Positive for back pain.  All other systems reviewed and are negative.   Physical Exam Updated Vital Signs BP (!) 160/73   Pulse 74   Temp 97.9 F (36.6 C) (Oral)   Resp 13   SpO2 100%  Physical Exam Vitals and nursing note reviewed.   Constitutional:      General: She is not in acute distress.    Appearance: Normal appearance. She is not ill-appearing, toxic-appearing or diaphoretic.  HENT:     Head: Normocephalic and atraumatic.     Nose: Nose normal.     Mouth/Throat:     Mouth: Mucous membranes are moist.     Pharynx: Oropharynx is clear.  Eyes:     Extraocular Movements: Extraocular movements intact.     Conjunctiva/sclera: Conjunctivae normal.     Pupils: Pupils are equal, round, and reactive to light.  Cardiovascular:     Rate and Rhythm: Normal rate and regular rhythm.  Pulmonary:     Effort: Pulmonary effort is normal.     Breath sounds: Rales present. No wheezing.  Abdominal:     General: Abdomen is flat. Bowel sounds are normal. There is distension.     Palpations: Abdomen is soft.     Tenderness: There is no abdominal tenderness.  Musculoskeletal:     Cervical back: Normal range of motion and neck supple. No tenderness.     Right lower leg: Edema present.     Left lower leg: Edema present.  Skin:    General: Skin is warm and dry.     Capillary Refill: Capillary refill takes less than 2 seconds.  Neurological:     Mental Status: She is alert and oriented to person, place, and time.     ED Results / Procedures / Treatments   Labs (all labs ordered are listed, but only abnormal results are displayed) Labs Reviewed  BASIC METABOLIC PANEL - Abnormal; Notable for the following components:      Result Value   Potassium 5.4 (*)    Chloride 114 (*)    CO2 15 (*)    Glucose, Bld 107 (*)    BUN 73 (*)    Creatinine, Ser 5.07 (*)    Calcium 8.5 (*)    GFR, Estimated 9 (*)    All other components within normal limits  CBC - Abnormal; Notable for the following components:   RBC 2.37 (*)    Hemoglobin 7.6 (*)    HCT 24.2 (*)    MCV 102.1 (*)    RDW 18.3 (*)    All other components within normal limits  HEPATIC FUNCTION PANEL  BRAIN NATRIURETIC PEPTIDE  HEPATITIS B SURFACE ANTIGEN  HEPATITIS  B SURFACE ANTIBODY, QUANTITATIVE  TROPONIN I (HIGH SENSITIVITY)  TROPONIN I (HIGH SENSITIVITY)    EKG None  Radiology DG Chest 2 View  Result Date: 02/10/2023 CLINICAL DATA:  Shortness of breath. Right-sided chest pain for several months. Recent hospitalization 01/31/2023. COPD. Current smoker. EXAM: CHEST - 2 VIEW COMPARISON:  Chest radiographs 01/23/2023, 11/21/2022; CT chest 11/15/2021 FINDINGS: Cardiac silhouette is again mildly enlarged. The visualized mediastinal contours are within normal limits. Moderate right  pleural effusion and basilar airspace opacification is very similar to 01/23/2023. The left lung is clear, with improved aeration of the left lower lung. Minimal blunting of left costophrenic angle is unchanged to minimally improved from most recent 01/23/2023 radiograph. No pneumothorax. Mild-to-moderate multilevel degenerative disc changes of the thoracic spine. IMPRESSION: 1. Moderate right pleural effusion and basilar airspace opacification is very similar to 01/23/2023. 2. Minimal blunting of left costophrenic angle is unchanged to minimally improved from most recent 01/23/2023 radiograph. Electronically Signed   By: Neita Garnet M.D.   On: 02/10/2023 13:53    Procedures Procedures   Medications Ordered in ED Medications  oxyCODONE-acetaminophen (PERCOCET/ROXICET) 5-325 MG per tablet 1 tablet (has no administration in time range)  Chlorhexidine Gluconate Cloth 2 % PADS 6 each (has no administration in time range)  Darbepoetin Alfa (ARANESP) injection 150 mcg (has no administration in time range)    ED Course/ Medical Decision Making/ A&P Clinical Course as of 02/10/23 1524  Mon Feb 10, 2023  1444 Hasn't had dialysis since being discharged from hospital. Micah Flesher to PCP this morning and noted to have volume overload. Sent here for dialysis. Complaining of "light" chest pain also complaining of chronic low back pain, leg pain. Denies N/V/D. Endorsing abdominal distention.  States she is unable to be compliant on dialysis due to being homeless and not having address and not knowing where her transport can pick her up. Is endorsing SOB, leg swelling.  [CG]  1506 "Only goes to dialysis when she wants to", shob, cough, chest discomfort, abdominal distension, swelling. Fluid overload. Needs dialysis acutely, crackles on lungs, fluid on lungs.  [CP]    Clinical Course User Index [CG] Al Decant, PA-C [CP] Olene Floss, PA-C   Medical Decision Making Amount and/or Complexity of Data Reviewed Labs: ordered. Radiology: ordered.  Risk Prescription drug management.   62 year old female presents to ED for evaluation.  Please see HPI for further details.  On exam patient is afebrile, nontachycardic.  Her lung sounds have rales throughout, she is satting with oxygen saturation 100% room air.  Abdomen has distention however soft and compressible.  No tenderness.  Neurological examination at baseline.  She does have pitting edema to bilateral lower extremities.  Patient CBC collected in triage shows a hemoglobin of 7.6 which is around patient baseline.  No leukocytosis.  Metabolic panel shows potassium 5.4, creatinine 5.07 which is slightly about the patient baseline.  Bicarb 15, BUN 73.  Patient troponin 12.  Chest x-ray shows pleural effusions.  Patient is in need of dialysis.  Have contacted nephrology, Dr. Thedore Mins, and they have agreed to dialyze the patient while she is here.  Patient most likely can go home after dialysis.  Patient will be signed out oncoming provider Shela Commons PA-C.  Plan of management discussed with oncoming provider.   Final Clinical Impression(s) / ED Diagnoses Final diagnoses:  ESRD (end stage renal disease) on dialysis [facility])    Rx / DC Orders ED Discharge Orders     None         Al Decant, PA-C 02/10/23 1524    Elayne Snare K, DO 02/10/23 1604

## 2023-02-10 NOTE — ED Provider Notes (Signed)
Accepted handoff at shift change from [facility]. Please see prior provider note for more detail.   Briefly: Patient is 62 y.o.   DDX: concern for "Only goes to dialysis when she wants to", shob, cough, chest discomfort, abdominal distension, swelling. Fluid overload. Needs dialysis acutely, crackles on lungs, fluid on lungs.   Plan: CBC with anemia, hemoglobin 7.6, anemia of chronic disease, BMP with hyperkalemia, potassium 5.4, elevated BUN, creatinine consistent with ESRD, she has normal troponin, plain film chest x-ray shows pleural effusion, EKG with normal sinus rhythm, ST-T changes unchanged compared to previous baseline, spoke with the nephrologist, Dr. Thedore Mins who will take patient for dialysis, she is stable for discharge after dialysis.  Taken for dialysis, stable for discharge once she returns to the floor.    RISR  EDTHIS    Olene Floss, PA-C 02/10/23 1954    Rozelle Logan, DO 02/10/23 2310

## 2023-02-10 NOTE — Progress Notes (Signed)
Called by ER. Patient is a ESRD patient, noncompliant with treatments, is supposed to be dialyzing through local ER. Last HD was here when she was last admitted (02/01/23). Patient seen and examined in ER. She has unstable living situation, currently staying with her ex. She is concerned about transportation home post HD today, will request SW assistance. She is also concerned about a 'knot' in her neck--will defer to her PCP for this. She also reports a stye in her left eye, saw PCP for this earlier today. She reports that she will likely do once a week HD. She is still having a hard time grasping three times a week. We have discussed this in detail including risks.  Phys exam BP (!) 160/73   Pulse 74   Temp 97.9 F (36.6 C) (Oral)   Resp 13   Ht 5\' 7"  (1.702 m)   Wt 56.3 kg   SpO2 100%   BMI 19.44 kg/m  Gen: NAD CV: RRR Resp: decreased breath sound right>left base, unlabored/normal wob, speaking in full sentences Abd: slightly distended Ext: +edema b/l Les Neuro: awake, alert Dialysis access: RUE AVF +b/t  Plan: Will plan for HD and ESA today. Possible discharge from ER post HD. Strongly encouraged to maintain three times per week HD. Even advised her to come here on Wed and Fri AM but it doesn't seem that she will. Please let us know if patient is to be formally admitted. Please call on-call nephrologist with any questions/concerns.  Anthony Sar, MD [facility]

## 2023-02-10 NOTE — Progress Notes (Signed)
Patient ID: [name], female   DOB: 1960/04/23, 62 y.o.   MRN: [id number]     [name], is a 63 y.o. female  WGN:[id number]  HQI:[id number]  DOB - [birth year]  Chief Complaint  Patient presents with   Hospitalization Follow-up       Subjective:   [name] is a 62 y.o. female here today for a follow up visit After hospitalization 11/6-11/16.  She has not had dialysis since leaving the hospital.  She says she "I am only going to get dialysis when I feel like it."  She says she is going to the hospital when she leaves here today.  She says the reason she does not go regularly is she doesn't really feel like she needs to go as regularly as they say she does.  She says she has had many conversations with providers and social workers and they "aren't going to convince me otherwise, so, don't even start."  She needs a rf on buspar.  She has a stye on her L eye.  She is starting to feel like she needs to go to dialysis bc she feels more sob.    She says she is living with her Ex boyfriend currently or stays in a hotel.     From discharge summary: Brief Summary: Patient is a 62 y.o.  female with history of ESRD on HD-noncompliant due to social/homelessness issues-presenting with shortness of breath-found to have pulmonary edema/volume overload and subsequently admitted to the hospitalist service.  Hospital course by problem list Acute on chronic HFpEF due to noncompliance with hemodialysis Improved after HD Still makes urine-initially on IV Lasix but has been switched to daily Demadex dosing. Volume status stable On room air   ESRD on HD Noncompliant due to social/homelessness Nonfunctioning TDC removed by vascular surgery on 11/12 Tolerating HD through AVF-since numerous alarms throughout HD-underwent fistulogram on 11/15 which did not show any major stenosis. She unfortunately is homeless-and has not been able to arrange for any alternate living arrangements-hence she is  unable to be assigned a regular outpatient dialysis unit.  Since she will remain homeless-she plans to come to the emergency room 3 times a week for ongoing dialysis.   COPD Not in exacerbation Bronchodilators   Normocytic anemia Primarily due to CKD/ESRD-noncompliance to Aranesp Denies any overt GI bleeding-no melena/hematochezia Hb stable after 2 units of PRBC on 11/8   Hypokalemia Repleted.   HLD Statin   HTN BP already better with volume overload HD Continue to monitor   Homelessness Appreciate social work evaluation-see above.   Underweight: Estimated body mass index is 18.3 kg/m as calculated from the following:   Height as of this encounter: 5\' 7"  (1.702 m).   Weight as of this encounter: 53 kg.    Discharge Diagnoses:  Principal Problem:   Acute on chronic anemia Active Problems:   Chronic diastolic CHF (congestive heart failure) (HCC)   CHF exacerbation (HCC)   ESRD on dialysis (HCC)   Volume overload   Noncompliance with dialysis   Essential hypertension   History of COPD   Hyperlipidemia   Generalized anxiety disorder No problems updated.  ALLERGIES: Allergies  Allergen Reactions   Zestril [Lisinopril] Other (See Comments)    Hyperkalemia    PAST MEDICAL HISTORY: Past Medical History:  Diagnosis Date   Alcohol abuse    Allergy    Anxiety    Arthritis    Asthma    Cannabis abuse    Cocaine abuse (HCC)  COPD (chronic obstructive pulmonary disease) (HCC)    Depression    Drug addiction (HCC)    GERD (gastroesophageal reflux disease)    Heart murmur    Homelessness    Hyperlipidemia    Hypertension    pt stated "every once in a while BP will be high but has not been prescribed medication for HTN.    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 Authorizing Provider  acetaminophen (TYLENOL) 500 MG tablet Take 2 tablets (1,000 mg total) by mouth 2 (two) times daily as needed for moderate pain (pain score 4-6), fever or headache. 02/01/23  Yes Ghimire, Werner Lean, MD  albuterol (VENTOLIN HFA) 108 (90 Base) MCG/ACT inhaler Inhale 2 puffs into the lungs every 6 (six) hours as needed for wheezing or shortness of breath. 02/01/23  Yes Ghimire, Werner Lean, MD  atorvastatin (LIPITOR) 10 MG tablet Take 1 tablet (10 mg total) by mouth daily. 02/01/23  Yes Ghimire, Werner Lean, MD  ferrous sulfate 325 (65 FE) MG tablet Take 1 tablet (325 mg total) by mouth daily with breakfast. 02/01/23  Yes Ghimire, Werner Lean, MD  Fluticasone-Umeclidin-Vilant (TRELEGY ELLIPTA) 100-62.5-25 MCG/ACT AEPB Inhale 1 puff into the lungs daily. 02/01/23  Yes Ghimire, Werner Lean, MD  gabapentin (NEURONTIN) 100 MG capsule Take 1 capsule (100 mg total) by mouth 3 (three) times daily. 02/01/23  Yes Ghimire, Werner Lean, MD  gentamicin (GARAMYCIN) 0.3 % ophthalmic solution Place 1 drop into the left eye 3 (three) times daily. 02/10/23  Yes Anders Simmonds, PA-C  hydrOXYzine (ATARAX) 25 MG tablet Take 1 tablet (25 mg total) by mouth 3 (three) times daily as needed for anxiety or itching. 02/01/23  Yes Ghimire, Werner Lean, MD  ipratropium-albuterol (DUONEB) 0.5-2.5 (3) MG/3ML SOLN Take 3 mLs by nebulization every 6 (six) hours as needed (shortness of breath and wheezing). 02/01/23  Yes Ghimire, Werner Lean, MD  multivitamin (RENA-VIT) TABS tablet Take 1 tablet by mouth at bedtime. 02/01/23  Yes Ghimire, Werner Lean, MD  pantoprazole (PROTONIX) 40 MG tablet Take 1 tablet (40mg ) by mouth daily 02/01/23  Yes Ghimire, Werner Lean, MD  sodium bicarbonate 650 MG tablet Take 1 tablet (650 mg total) by mouth 3 (three) times daily. 02/01/23  Yes Ghimire, Werner Lean, MD  torsemide (DEMADEX) 100 MG tablet Take 1 tablet (100 mg total) by mouth daily. 02/01/23  Yes  Ghimire, Werner Lean, MD  traZODone (DESYREL) 50 MG tablet Take 1 tablet (50 mg total) by mouth at bedtime as needed for sleep. 02/01/23  Yes Ghimire, Werner Lean, MD  busPIRone (BUSPAR) 10 MG tablet Take 1 tablet (10 mg total) by mouth 2 (two) times daily. 02/10/23   Caeleb Batalla, Marzella Schlein, PA-C    ROS: Neg HEENT Neg resp Neg cardiac Neg GI Neg GU Neg MS Neg psych Neg neuro  Objective:   Vitals:   02/10/23 1108  BP: (!) 158/70  Pulse: 79  SpO2: 100%  Weight: 124 lb 3.2 oz (56.3 kg)   Exam General appearance : Awake, alert, not in any distress. Speech Clear. Not toxic looking; cachectic, appears in poor health HEENT: Atraumatic and Normocephalic, pupils equally reactive to light and accomodation, EOM intact.  There is a a hordeolum on the  lower lid of the L eye.  Missing upper teeth Neck: Supple, no JVD. No cervical lymphadenopathy.  Chest: fair air entry bilaterally, mild wheezing at B bases CVS: S1 S2 regular, no murmurs.  Neurology: Awake alert, and oriented X 3, CN II-XII intact, Non focal Skin: No Rash  Data Review Lab Results  Component Value Date   HGBA1C 4.6 (L) 10/25/2022   HGBA1C 5.6 08/20/2019   HGBA1C 5.20 04/21/2015    Assessment & Plan   1. Substance induced mood disorder (HCC) Will increase dose - busPIRone (BUSPAR) 10 MG tablet; Take 1 tablet (10 mg total) by mouth 2 (two) times daily.  Dispense: 60 tablet; Refill: 3  2. Anxiety and depression continue - busPIRone (BUSPAR) 10 MG tablet; Take 1 tablet (10 mg total) by mouth 2 (two) times daily.  Dispense: 60 tablet; Refill: 3  3. ESRD needing dialysis (HCC) Needs dialysis 3 times weekly.  I explained the reasons why multiple times and in many ways to improve overall health and take toxins out of the blood stream.  She is going for dialysis now  4. Homelessness Staying with Ex now  5. Noncompliance with dialysis Compliance imperative-discussed at length  6. Back pain of thoracolumbar region -  Ambulatory referral to Orthopedic Surgery  7. Hordeolum externum of left lower eyelid - gentamicin (GARAMYCIN) 0.3 % ophthalmic solution; Place 1 drop into the left eye 3 (three) times daily.  Dispense: 5 mL; Refill: 0  8. Hospital discharge follow-up    Either follow up here or at [facility]  The patient was given clear instructions to go to ER or return to medical center if symptoms don't improve, worsen or new problems develop. The patient verbalized understanding. The patient was told to call to get lab results if they haven't heard anything in the next week.      Georgian Co, PA-C [facility] [place], [place] [phone]   02/10/2023, 12:39 PM

## 2023-02-10 NOTE — Addendum Note (Signed)
Addended by: Dalene Carrow I on: 02/10/2023 03:19 PM   Modules accepted: Orders

## 2023-02-10 NOTE — Patient Instructions (Signed)
[email or web address] is the website for sober housing

## 2023-02-10 NOTE — Telephone Encounter (Addendum)
Spoke with patient  today during her scheduled office visit with her provider. Patient voiced that she is essentially  homeless   right now she is staying with an ex-boyfriend who she said they do not get [place], but he's agreed that she can stay there  until the 02/18/2023, when she received her check. Patient then has [facility] to [facility] [employer] where she [facility] $905 monthly of the $1200 she receives. Patient voiced that she has no one to help her with anything. She said she has 2 daughters but does not get along with either one of them . Voiced that she thought that one of her daughters  [name] was going to help her but they had a falling out and now she does not know if she will or not. Patient has not had dialysis in 9 days and does not have a have a dialysis unit to go to. Patient voiced that she does not want to go to dialysis advised that it has been recommended by her provider that since she does not have a dialysis unit to dialyze at she should go to [facility] ed after her appointment here. Advised patient that until she gets a permanent address it is going to be difficult for [place] to have a dialysis unit that accept her . Patient voiced that she has been banned from all the dialysis units in [place] , because she keeps missing appointment.  Advised patient of the consequences of not receiving hemodialysis . Advised patient that she should go to DSS and the housing Authority for and they may be able help her with housing and getting her starting on the [facility] program. Contact information given. Patient voiced that she does not have transportation . Bus passes given. Patient advised that the bus pass are for her to go to Apply for housing and SNAP. Patient Voiced that she would follow up with this as soon as she can.

## 2023-02-10 NOTE — ED Provider Triage Note (Signed)
Emergency Medicine Provider Triage Evaluation Note  [name] , a 62 y.o. female  was evaluated in triage.  Pt complains of shortness of breath and needing dialysis. She states that she last got dialysis when she was admitted in the hospital about 10 days ago. She is suppose to get dialysis 3x a week. She says that her shortness of breath is associated with not getting dialysis.  Review of Systems  Positive: Shortness of breath, needs dialysis Negative: Chest pain  Physical Exam  BP (!) 153/79   Pulse 80   Temp 97.9 F (36.6 C) (Oral)   Resp 20   SpO2 100%  Gen:   Awake, no distress   Resp:  Normal effort  MSK:   Moves extremities without difficulty  Other:    Medical Decision Making  Medically screening exam initiated at 1:18 PM.  Appropriate orders placed.  Ranie M Oakley was informed that the remainder of the evaluation will be completed by another provider, this initial triage assessment does not replace that evaluation, and the importance of remaining in the ED until their evaluation is complete.   Maxwell Marion, PA-C 02/10/23 1321

## 2023-02-10 NOTE — ED Triage Notes (Signed)
Pt c/o sob; dialysis pt, last dialyzed Sunday before last, non compliant with dialysis; c/o low back pain and bilateral leg pain, chronic, and "light" cp with cough; hx neuropathy; non productive cough; hx COPD, DM, ESRD

## 2023-02-11 ENCOUNTER — Telehealth: Payer: Self-pay | Admitting: *Deleted

## 2023-02-11 NOTE — ED Notes (Signed)
Pt states that someone was supposed to get her a ride back to her ex's house. Informed pt that she would need to wait for social work in the morning. Order placed for SW. Pt ambulatory to the waiting room to wait for SW. NAD noted.

## 2023-02-11 NOTE — Progress Notes (Signed)
   02/11/23 0200  Vitals  Temp 98.2 F (36.8 C)  Temp Source Oral  BP (!) 146/74  BP Location Left Arm  BP Method Automatic  Patient Position (if appropriate) Lying  Pulse Rate 72  Pulse Rate Source Monitor  Resp 20  During Treatment Monitoring  Blood Flow Rate (mL/min) 0 mL/min  Arterial Pressure (mmHg) -252.31 mmHg  Venous Pressure (mmHg) 39.39 mmHg  TMP (mmHg) 15.75 mmHg  Ultrafiltration Rate (mL/min) 0 mL/min  Dialysate Flow Rate (mL/min) 299 ml/min  Dialysate Potassium Concentration 2  Dialysate Calcium Concentration 2.5  Duration of HD Treatment -hour(s) 3.1 hour(s)  Cumulative Fluid Removed (mL) per Treatment  2314.06  HD Safety Checks Performed Yes  Intra-Hemodialysis Comments Progressing as prescribed  Dialysis Fluid Bolus Normal Saline  Bolus Amount (mL) 100 mL  Post Treatment  Dialyzer Clearance Lightly streaked  Liters Processed 55.8  Fluid Removed (mL) 2300 mL  Post-Hemodialysis Comments  (Due to cramping in legs and right handpt request to terminate treatment.)  AVG/AVF Arterial Site Held (minutes) 10 minutes  AVG/AVF Venous Site Held (minutes) 10 minutes  Fistula / Graft Right Upper arm Arteriovenous fistula  Placement Date/Time: 08/14/22 1230   Orientation: Right  Access Location: (c) Upper arm  Access Type: Arteriovenous fistula  Site Condition No complications  Fistula / Graft Assessment Present;Thrill;Bruit  Status Deaccessed  Needle Size 16  Drainage Description None   Pt signed off last 25 minutes of Dialysis due to cramping in lower extremities

## 2023-02-11 NOTE — Progress Notes (Signed)
CSW spoke with ED staff who states patient requesting CSW contact [facility] to arrange transportation home.  CSW spoke with [name] at [facility] to arrange transportation home. Christina arranged transportation and states the ride will be here within a few hours to get patient - delayed pick up due to the ride not being previously scheduled.  Edwin Dada, MSW, LCSW Transitions of Care  Clinical Social Worker II [phone]

## 2023-02-11 NOTE — Progress Notes (Signed)
  Care Coordination  Outreach Note  02/11/2023 Name: [name] MRN: [id number] DOB: 1961/03/11   Care Coordination Outreach Attempts: An unsuccessful telephone outreach was attempted today to offer the patient information about available care coordination services.  Follow Up Plan:  Additional outreach attempts will be made to offer the patient care coordination information and services.   Encounter Outcome:  No Answer  Gwenevere Ghazi  Care Coordination Care Guide  Direct Dial: [phone]

## 2023-02-11 NOTE — ED Provider Notes (Signed)
Patient reassessed after returning from dialysis.  Vital signs are stable.  She is not hypoxic.  O2 sat is 100% on room air.  She states that she has had some slight cramps in her extremities, but they were worse earlier.  States that she does not have a ride home and will need a cab or will need to wait until her ride service can come get her.   Roxy Horseman, PA-C 02/11/23 Kandra Nicolas, MD 02/11/23 [phone]

## 2023-02-11 NOTE — ED Notes (Signed)
Pt returned from Dialysis, ambulated to the bathroom w/ one stand my assist.

## 2023-02-14 NOTE — Progress Notes (Signed)
  Care Coordination   Note   02/14/2023 Name: [name] MRN: [id number] DOB: [birth year]  [name] is a 62 y.o. year old female who sees [name], MD for primary care. I reached out to [facility] by phone today to offer care coordination services.  Ms. Whoolery was given information about Care Coordination services today including:   The Care Coordination services include support from the care team which includes your Nurse Coordinator, Clinical Social Worker, or Pharmacist.  The Care Coordination team is here to help remove barriers to the health concerns and goals most important to you. Care Coordination services are voluntary, and the patient may decline or stop services at any time by request to their care team member.   Care Coordination Consent Status: Patient agreed to services and verbal consent obtained.   Follow up plan:  Telephone appointment with care coordination team member scheduled for:  12/12  Encounter Outcome:  Patient Scheduled  [facility] Coordination Care Guide  Direct Dial: [phone]

## 2023-02-21 ENCOUNTER — Ambulatory Visit: Payer: Medicaid Other | Admitting: Physical Medicine and Rehabilitation

## 2023-02-24 ENCOUNTER — Other Ambulatory Visit: Payer: Self-pay

## 2023-02-24 ENCOUNTER — Telehealth: Payer: Self-pay | Admitting: Physical Medicine and Rehabilitation

## 2023-02-24 NOTE — Telephone Encounter (Signed)
NA

## 2023-02-27 ENCOUNTER — Other Ambulatory Visit: Payer: Self-pay

## 2023-02-27 NOTE — Patient Outreach (Signed)
  Medicaid Managed Care   Unsuccessful Outreach Note  02/27/2023 Name: [name] MRN: [id number] DOB: [birth year]  Referred by: [name], MD Reason for referral : High Risk Managed Medicaid (MM social work unsuccessful telephone outreach )   An unsuccessful telephone outreach was attempted today. The patient was referred to the case management team for assistance with care management and care coordination.   Follow Up Plan: A HIPAA compliant phone message was left for the patient providing contact information and requesting a return call.   Abelino Derrick, MHA [facility]  Managed [facility] Social Worker [phone]

## 2023-02-27 NOTE — Patient Instructions (Signed)
  Medicaid Managed Care   Unsuccessful Outreach Note  02/27/2023 Name: [name] MRN: [id number] DOB: [birth year]  Referred by: [name], MD Reason for referral : High Risk Managed Medicaid (MM social work unsuccessful telephone outreach )   An unsuccessful telephone outreach was attempted today. The patient was referred to the case management team for assistance with care management and care coordination.   Follow Up Plan: A HIPAA compliant phone message was left for the patient providing contact information and requesting a return call.   Abelino Derrick, MHA [facility]  Managed [facility] Social Worker [phone]

## 2023-03-06 ENCOUNTER — Other Ambulatory Visit: Payer: Self-pay

## 2023-03-17 ENCOUNTER — Ambulatory Visit: Payer: Self-pay
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
## 2023-03-17 NOTE — Patient Instructions (Signed)
Visit Information  [name] was given information about Medicaid Managed Care team care coordination services as a part of their [facility] Medicaid benefit. [name] verbally consented to engagement with the [facility] Managed Care team.   If you are experiencing a medical emergency, please call 911 or report to your local emergency department or urgent care.   If you have a non-emergency medical problem during routine business hours, please contact your provider's office and ask to speak with a nurse.   For questions related to your [facility], please call: [phone] or visit the homepage here: [email or web address]  If you would like to schedule transportation through your [facility], please call the following number at least 2 days in advance of your appointment: [phone]   Rides for urgent appointments can also be made after hours by calling Member Services.  Call the Behavioral Health Crisis Line at [phone], at any time, 24 hours a day, 7 days a week. If you are in danger or need immediate medical attention call 911.  If you would like help to quit smoking, call 1-800-QUIT-NOW (787-017-2629) OR Espaol: 1-855-Djelo-Ya (4-010-272-5366) o para ms informacin haga clic aqu or Text READY to 440-347 to register via text  [name] - following are the goals we discussed in your visit today:   Goals Addressed   None       Social Worker will follow up in 30 days.  Gus Puma, Kenard Gower, MHA [facility]  Managed Medicaid Social Worker [phone]   Following is a copy of your plan of care:  There are no care plans that you recently modified to display for this patient.

## 2023-04-15 ENCOUNTER — Emergency Department (HOSPITAL_COMMUNITY): Payer: Medicare Other

## 2023-04-15 ENCOUNTER — Other Ambulatory Visit: Payer: Self-pay

## 2023-04-15 ENCOUNTER — Encounter (HOSPITAL_COMMUNITY): Payer: Self-pay | Admitting: Emergency Medicine

## 2023-04-15 ENCOUNTER — Emergency Department (HOSPITAL_COMMUNITY): Payer: Medicaid Other

## 2023-04-15 ENCOUNTER — Inpatient Hospital Stay (HOSPITAL_COMMUNITY)
Admission: EM | Admit: 2023-04-15 | Discharge: 2023-04-21 | DRG: 314 | Disposition: A | Discharge status: Home / Self Care | Payer: Medicare Other | Attending: Student | Admitting: Student

## 2023-04-15 DIAGNOSIS — D649 Anemia, unspecified: Secondary | ICD-10-CM | POA: Diagnosis present

## 2023-04-15 DIAGNOSIS — R7989 Other specified abnormal findings of blood chemistry: Secondary | ICD-10-CM | POA: Diagnosis present

## 2023-04-15 DIAGNOSIS — J449 Chronic obstructive pulmonary disease, unspecified: Secondary | ICD-10-CM | POA: Diagnosis present

## 2023-04-15 DIAGNOSIS — R0602 Shortness of breath: Secondary | ICD-10-CM | POA: Diagnosis not present

## 2023-04-15 DIAGNOSIS — Z1152 Encounter for screening for COVID-19: Secondary | ICD-10-CM

## 2023-04-15 DIAGNOSIS — Z91158 Patient's noncompliance with renal dialysis for other reason: Secondary | ICD-10-CM

## 2023-04-15 DIAGNOSIS — F1510 Other stimulant abuse, uncomplicated: Secondary | ICD-10-CM | POA: Diagnosis present

## 2023-04-15 DIAGNOSIS — F419 Anxiety disorder, unspecified: Secondary | ICD-10-CM | POA: Diagnosis present

## 2023-04-15 DIAGNOSIS — F17200 Nicotine dependence, unspecified, uncomplicated: Secondary | ICD-10-CM | POA: Diagnosis present

## 2023-04-15 DIAGNOSIS — Z79899 Other long term (current) drug therapy: Secondary | ICD-10-CM

## 2023-04-15 DIAGNOSIS — Z833 Family history of diabetes mellitus: Secondary | ICD-10-CM

## 2023-04-15 DIAGNOSIS — Z91148 Patient's other noncompliance with medication regimen for other reason: Secondary | ICD-10-CM

## 2023-04-15 DIAGNOSIS — L03113 Cellulitis of right upper limb: Secondary | ICD-10-CM | POA: Diagnosis present

## 2023-04-15 DIAGNOSIS — Z992 Dependence on renal dialysis: Secondary | ICD-10-CM

## 2023-04-15 DIAGNOSIS — Y832 Surgical operation with anastomosis, bypass or graft as the cause of abnormal reaction of the patient, or of later complication, without mention of misadventure at the time of the procedure: Secondary | ICD-10-CM | POA: Diagnosis present

## 2023-04-15 DIAGNOSIS — Z5900 Homelessness unspecified: Secondary | ICD-10-CM

## 2023-04-15 DIAGNOSIS — F32A Depression, unspecified: Secondary | ICD-10-CM | POA: Diagnosis present

## 2023-04-15 DIAGNOSIS — Z888 Allergy status to other drugs, medicaments and biological substances status: Secondary | ICD-10-CM

## 2023-04-15 DIAGNOSIS — Z91199 Patient's noncompliance with other medical treatment and regimen due to unspecified reason: Secondary | ICD-10-CM

## 2023-04-15 DIAGNOSIS — D631 Anemia in chronic kidney disease: Principal | ICD-10-CM | POA: Diagnosis present

## 2023-04-15 DIAGNOSIS — F17210 Nicotine dependence, cigarettes, uncomplicated: Secondary | ICD-10-CM | POA: Diagnosis present

## 2023-04-15 DIAGNOSIS — F1090 Alcohol use, unspecified, uncomplicated: Secondary | ICD-10-CM | POA: Diagnosis present

## 2023-04-15 DIAGNOSIS — E876 Hypokalemia: Secondary | ICD-10-CM | POA: Diagnosis present

## 2023-04-15 DIAGNOSIS — Z66 Do not resuscitate: Secondary | ICD-10-CM | POA: Diagnosis present

## 2023-04-15 DIAGNOSIS — R079 Chest pain, unspecified: Secondary | ICD-10-CM

## 2023-04-15 DIAGNOSIS — R197 Diarrhea, unspecified: Secondary | ICD-10-CM | POA: Diagnosis present

## 2023-04-15 DIAGNOSIS — Z789 Other specified health status: Secondary | ICD-10-CM | POA: Diagnosis present

## 2023-04-15 DIAGNOSIS — E1122 Type 2 diabetes mellitus with diabetic chronic kidney disease: Secondary | ICD-10-CM | POA: Diagnosis present

## 2023-04-15 DIAGNOSIS — Z560 Unemployment, unspecified: Secondary | ICD-10-CM

## 2023-04-15 DIAGNOSIS — E785 Hyperlipidemia, unspecified: Secondary | ICD-10-CM | POA: Diagnosis present

## 2023-04-15 DIAGNOSIS — E872 Acidosis, unspecified: Secondary | ICD-10-CM | POA: Diagnosis present

## 2023-04-15 DIAGNOSIS — F1410 Cocaine abuse, uncomplicated: Secondary | ICD-10-CM | POA: Diagnosis present

## 2023-04-15 DIAGNOSIS — T827XXA Infection and inflammatory reaction due to other cardiac and vascular devices, implants and grafts, initial encounter: Principal | ICD-10-CM | POA: Diagnosis present

## 2023-04-15 DIAGNOSIS — B1920 Unspecified viral hepatitis C without hepatic coma: Secondary | ICD-10-CM | POA: Diagnosis present

## 2023-04-15 DIAGNOSIS — J4489 Other specified chronic obstructive pulmonary disease: Secondary | ICD-10-CM | POA: Diagnosis present

## 2023-04-15 DIAGNOSIS — Z5982 Transportation insecurity: Secondary | ICD-10-CM

## 2023-04-15 DIAGNOSIS — Z5986 Financial insecurity: Secondary | ICD-10-CM

## 2023-04-15 DIAGNOSIS — I10 Essential (primary) hypertension: Secondary | ICD-10-CM | POA: Diagnosis present

## 2023-04-15 DIAGNOSIS — Z7951 Long term (current) use of inhaled steroids: Secondary | ICD-10-CM

## 2023-04-15 DIAGNOSIS — N186 End stage renal disease: Secondary | ICD-10-CM

## 2023-04-15 DIAGNOSIS — K219 Gastro-esophageal reflux disease without esophagitis: Secondary | ICD-10-CM | POA: Diagnosis present

## 2023-04-15 DIAGNOSIS — N185 Chronic kidney disease, stage 5: Secondary | ICD-10-CM | POA: Diagnosis present

## 2023-04-15 DIAGNOSIS — G894 Chronic pain syndrome: Secondary | ICD-10-CM | POA: Diagnosis present

## 2023-04-15 DIAGNOSIS — E43 Unspecified severe protein-calorie malnutrition: Secondary | ICD-10-CM | POA: Diagnosis present

## 2023-04-15 DIAGNOSIS — A419 Sepsis, unspecified organism: Secondary | ICD-10-CM | POA: Diagnosis present

## 2023-04-15 DIAGNOSIS — Z681 Body mass index (BMI) 19 or less, adult: Secondary | ICD-10-CM

## 2023-04-15 DIAGNOSIS — I120 Hypertensive chronic kidney disease with stage 5 chronic kidney disease or end stage renal disease: Secondary | ICD-10-CM | POA: Diagnosis present

## 2023-04-15 LAB — CBC WITH DIFFERENTIAL/PLATELET
Abs Immature Granulocytes: 0.03 10*3/uL (ref 0.00–0.07)
Basophils Absolute: 0 10*3/uL (ref 0.0–0.1)
Basophils Relative: 0 %
Eosinophils Absolute: 0 10*3/uL (ref 0.0–0.5)
Eosinophils Relative: 0 %
HCT: 19.7 % — ABNORMAL LOW (ref 36.0–46.0)
Hemoglobin: 6.6 g/dL — CL (ref 12.0–15.0)
Immature Granulocytes: 0 %
Lymphocytes Relative: 10 %
Lymphs Abs: 0.7 10*3/uL (ref 0.7–4.0)
MCH: 33.7 pg (ref 26.0–34.0)
MCHC: 33.5 g/dL (ref 30.0–36.0)
MCV: 100.5 fL — ABNORMAL HIGH (ref 80.0–100.0)
Monocytes Absolute: 0.4 10*3/uL (ref 0.1–1.0)
Monocytes Relative: 5 %
Neutro Abs: 6.6 10*3/uL (ref 1.7–7.7)
Neutrophils Relative %: 85 %
Platelets: 293 10*3/uL (ref 150–400)
RBC: 1.96 MIL/uL — ABNORMAL LOW (ref 3.87–5.11)
RDW: 14.6 % (ref 11.5–15.5)
WBC: 7.8 10*3/uL (ref 4.0–10.5)
nRBC: 0 % (ref 0.0–0.2)

## 2023-04-15 LAB — BASIC METABOLIC PANEL
Anion gap: 17 — ABNORMAL HIGH (ref 5–15)
BUN: 82 mg/dL — ABNORMAL HIGH (ref 8–23)
CO2: 10 mmol/L — ABNORMAL LOW (ref 22–32)
Calcium: 9.1 mg/dL (ref 8.9–10.3)
Chloride: 108 mmol/L (ref 98–111)
Creatinine, Ser: 5.82 mg/dL — ABNORMAL HIGH (ref 0.44–1.00)
GFR, Estimated: 8 mL/min — ABNORMAL LOW (ref >60)
Glucose, Bld: 111 mg/dL — ABNORMAL HIGH (ref 70–99)
Potassium: 4.3 mmol/L (ref 3.5–5.1)
Sodium: 135 mmol/L (ref 135–145)

## 2023-04-15 LAB — RESP PANEL BY RT-PCR (RSV, FLU A&B, COVID)  RVPGX2
Influenza A by PCR: NEGATIVE
Influenza B by PCR: NEGATIVE
Resp Syncytial Virus by PCR: NEGATIVE
SARS Coronavirus 2 by RT PCR: NEGATIVE

## 2023-04-15 LAB — I-STAT CG4 LACTIC ACID, ED
Lactic Acid, Venous: 0.3 mmol/L — ABNORMAL LOW (ref 0.5–1.9)
Lactic Acid, Venous: 0.4 mmol/L — ABNORMAL LOW (ref 0.5–1.9)

## 2023-04-15 LAB — CK: Total CK: 72 U/L (ref 38–234)

## 2023-04-15 LAB — TROPONIN I (HIGH SENSITIVITY): Troponin I (High Sensitivity): 131 ng/L (ref <18)

## 2023-04-15 MED ORDER — SODIUM CHLORIDE 0.9 % IV SOLN
2.0000 g | Freq: Once | INTRAVENOUS | Status: AC
  Administered 2023-04-15: 2 g via INTRAVENOUS
  Filled 2023-04-15: qty 20

## 2023-04-15 MED ORDER — ACETAMINOPHEN 325 MG PO TABS
650.0000 mg | ORAL_TABLET | Freq: Four times a day (QID) | ORAL | Status: DC | PRN
  Administered 2023-04-15 (×2): 650 mg via ORAL
  Filled 2023-04-15 (×2): qty 2

## 2023-04-15 MED ORDER — GADOBUTROL 1 MMOL/ML IV SOLN
5.0000 mL | Freq: Once | INTRAVENOUS | Status: AC | PRN
  Administered 2023-04-15: 5 mL via INTRAVENOUS

## 2023-04-15 NOTE — ED Provider Notes (Signed)
[place] EMERGENCY DEPARTMENT AT [facility] Provider Note   CSN: [id number] Arrival date & time: 04/15/23  1524     History  Chief Complaint  Patient presents with   Multiple Complaints    [name] is a 63 y.o. female with extensive medical history to include COPD still smoking, homelessness, hypertension, hyperlipidemia, PFO, diabetes, ESRD on dialysis.  The patient states that she has not gone to dialysis since November.  She is here complaining of pain in her low back as well as bilateral leg pain that began yesterday after walking a [place] distance.  Reports that in the past whenever she is in need of dialysis these are her presenting symptoms.  Denies any trauma to account for her bilateral leg pain or low back pain.  Denies any bowel or bladder incontinence, numbness of groin, distal weakness.  Reports that she has not had dialysis since November because she "does not want to have to come in 3 times a week to the hospital for it".  She states that she has had worsening low back pain for the last 2 days associated with slight dysuria.  Denies any increased vaginal discharge or concern for STI.  Also endorsing shortness of breath worse lying flat, chest pain that occurred last yesterday.  Denies any exertional component to her chest pain.  Denies lightheadedness, dizziness but does endorse weakness.  Denies any fevers at home however patient is febrile here.  Denies any known sick contacts.  Denies abdominal pain.  HPI     Home Medications Prior to Admission medications   Medication Sig Start Date End Date Taking? Authorizing Provider  acetaminophen (TYLENOL) 500 MG tablet Take 2 tablets (1,000 mg total) by mouth 2 (two) times daily as needed for moderate pain (pain score 4-6), fever or headache. 02/01/23  Yes Ghimire, Werner Lean, MD  albuterol (VENTOLIN HFA) 108 (90 Base) MCG/ACT inhaler Inhale 2 puffs into the lungs every 6 (six) hours as needed for wheezing or  shortness of breath. Patient taking differently: Inhale 3 puffs into the lungs every 6 (six) hours as needed for wheezing or shortness of breath. 02/01/23  Yes Ghimire, Werner Lean, MD  ascorbic acid (VITAMIN C) 500 MG tablet Take 500 mg by mouth daily.   Yes [provider]  atorvastatin (LIPITOR) 10 MG tablet Take 1 tablet (10 mg total) by mouth daily. 02/01/23  Yes Ghimire, Werner Lean, MD  B Complex Vitamins (VITAMIN B COMPLEX PO) Take 1 tablet by mouth daily. Unknown strength   Yes [provider]  busPIRone (BUSPAR) 10 MG tablet Take 1 tablet (10 mg total) by mouth 2 (two) times daily. 02/10/23  Yes Anders Simmonds, PA-C  ferrous sulfate 325 (65 FE) MG tablet Take 1 tablet (325 mg total) by mouth daily with breakfast. 02/01/23  Yes Ghimire, Werner Lean, MD  Fluticasone-Umeclidin-Vilant (TRELEGY ELLIPTA) 100-62.5-25 MCG/ACT AEPB Inhale 1 puff into the lungs daily. 02/01/23  Yes Ghimire, Werner Lean, MD  gabapentin (NEURONTIN) 100 MG capsule Take 1 capsule (100 mg total) by mouth 3 (three) times daily. 02/01/23  Yes Ghimire, Werner Lean, MD  hydrOXYzine (ATARAX) 25 MG tablet Take 1 tablet (25 mg total) by mouth 3 (three) times daily as needed for anxiety or itching. 02/01/23  Yes Ghimire, Werner Lean, MD  multivitamin (RENA-VIT) TABS tablet Take 1 tablet by mouth at bedtime. 02/01/23  Yes Ghimire, Werner Lean, MD  pantoprazole (PROTONIX) 40 MG tablet Take 1 tablet (40mg ) by mouth daily 02/01/23  Yes Ghimire, Werner Lean, MD  sodium bicarbonate 650 MG tablet Take 1 tablet (650 mg total) by mouth 3 (three) times daily. 02/01/23  Yes Ghimire, Werner Lean, MD  torsemide (DEMADEX) 100 MG tablet Take 1 tablet (100 mg total) by mouth daily. 02/01/23  Yes Ghimire, Werner Lean, MD  traZODone (DESYREL) 50 MG tablet Take 1 tablet (50 mg total) by mouth at bedtime as needed for sleep. 02/01/23  Yes Ghimire, Werner Lean, MD  ipratropium-albuterol (DUONEB) 0.5-2.5 (3) MG/3ML SOLN Take 3 mLs by nebulization every  6 (six) hours as needed (shortness of breath and wheezing). Patient not taking: Reported on 04/15/2023 02/01/23   Maretta Bees, MD      Allergies    Zestril [lisinopril]    Review of Systems   Review of Systems  Constitutional:  Positive for fever.  Respiratory:  Positive for shortness of breath.   Cardiovascular:  Positive for chest pain.  Gastrointestinal:  Negative for abdominal pain, diarrhea, nausea and vomiting.  Genitourinary:  Positive for dysuria. Negative for pelvic pain, vaginal discharge and vaginal pain.  Musculoskeletal:  Positive for back pain.  All other systems reviewed and are negative.   Physical Exam Updated Vital Signs BP (!) 178/117 (BP Location: Left Arm)   Pulse 97   Temp 99.5 F (37.5 C) (Axillary)   Resp (!) 23   Wt 49.9 kg   SpO2 100%   BMI 17.23 kg/m  Physical Exam Vitals and nursing note reviewed.  Constitutional:      General: She is not in acute distress.    Appearance: Normal appearance. She is not ill-appearing, toxic-appearing or diaphoretic.  HENT:     Head: Normocephalic and atraumatic.     Nose: Nose normal.     Mouth/Throat:     Mouth: Mucous membranes are moist.     Pharynx: Oropharynx is clear.  Eyes:     Extraocular Movements: Extraocular movements intact.     Conjunctiva/sclera: Conjunctivae normal.     Pupils: Pupils are equal, round, and reactive to light.  Cardiovascular:     Rate and Rhythm: Regular rhythm. Tachycardia present.  Pulmonary:     Effort: Pulmonary effort is normal.     Breath sounds: Rales present.  Abdominal:     General: Abdomen is flat. Bowel sounds are normal.     Palpations: Abdomen is soft.     Tenderness: There is no abdominal tenderness.  Musculoskeletal:     Cervical back: Normal range of motion and neck supple.  Skin:    General: Skin is warm and dry.     Capillary Refill: Capillary refill takes less than 2 seconds.  Neurological:     Mental Status: She is alert and oriented to  person, place, and time.     Comments: 5/5 strength to bilateral lower extremities      ED Results / Procedures / Treatments   Labs (all labs ordered are listed, but only abnormal results are displayed) Labs Reviewed  CBC WITH DIFFERENTIAL/PLATELET - Abnormal; Notable for the following components:      Result Value   RBC 1.96 (*)    Hemoglobin 6.6 (*)    HCT 19.7 (*)    MCV 100.5 (*)    All other components within normal limits  BASIC METABOLIC PANEL - Abnormal; Notable for the following components:   CO2 10 (*)    Glucose, Bld 111 (*)    BUN 82 (*)    Creatinine, Ser 5.82 (*)  GFR, Estimated 8 (*)    Anion gap 17 (*)    All other components within normal limits  I-STAT CG4 LACTIC ACID, ED - Abnormal; Notable for the following components:   Lactic Acid, Venous 0.3 (*)    All other components within normal limits  I-STAT CG4 LACTIC ACID, ED - Abnormal; Notable for the following components:   Lactic Acid, Venous 0.4 (*)    All other components within normal limits  TROPONIN I (HIGH SENSITIVITY) - Abnormal; Notable for the following components:   Troponin I (High Sensitivity) 131 (*)    All other components within normal limits  RESP PANEL BY RT-PCR (RSV, FLU A&B, COVID)  RVPGX2  CULTURE, BLOOD (ROUTINE X 2)  CULTURE, BLOOD (ROUTINE X 2)  CK  URINALYSIS, ROUTINE W REFLEX MICROSCOPIC  TYPE AND SCREEN  TROPONIN I (HIGH SENSITIVITY)    EKG None  Radiology DG Chest 2 View Result Date: 04/15/2023 CLINICAL DATA:  Sepsis.  Bilateral leg pain. EXAM: CHEST - 2 VIEW COMPARISON:  02/10/2023 FINDINGS: Heart size and mediastinal contours appear normal. Small right pleural effusion is improved when compared with the previous exam. No signs of interstitial edema or airspace consolidation. Bones appear osteopenic and there is mild multilevel degenerative disc disease. IMPRESSION: Small right pleural effusion, improved when compared with the previous exam. No signs of pneumonia.  Electronically Signed   By: Signa Kell M.D.   On: 04/15/2023 19:43    Procedures .Critical Care  Performed by: Al Decant, PA-C Authorized by: Al Decant, PA-C   Critical care provider statement:    Critical care time (minutes):  75   Critical care time was exclusive of:  Separately billable procedures and treating other patients   Critical care was necessary to treat or prevent imminent or life-threatening deterioration of the following conditions: Symptomatic anemia to 6.6.   Critical care was time spent personally by me on the following activities:  Blood draw for specimens, development of treatment plan with patient or surrogate, discussions with consultants, discussions with primary provider, evaluation of patient's response to treatment, examination of patient, interpretation of cardiac output measurements, obtaining history from patient or surrogate, ordering and performing treatments and interventions, ordering and review of laboratory studies, ordering and review of radiographic studies, pulse oximetry, re-evaluation of patient's condition and review of old charts   I assumed direction of critical care for this patient from another provider in my specialty: no     Care discussed with: admitting provider       Medications Ordered in ED Medications  acetaminophen (TYLENOL) tablet 650 mg (650 mg Oral Given 04/15/23 2113)  cefTRIAXone (ROCEPHIN) 2 g in sodium chloride 0.9 % 100 mL IVPB (2 g Intravenous New Bag/Given 04/15/23 2116)  gadobutrol (GADAVIST) 1 MMOL/ML injection 5 mL (5 mLs Intravenous Contrast Given 04/15/23 2301)    ED Course/ Medical Decision Making/ A&P   Medical Decision Making Amount and/or Complexity of Data Reviewed Labs: ordered. Radiology: ordered.   63 year old female presents to ED for evaluation.  Please see HPI for further details.  On examination patient is febrile, tachycardic.  Lung sounds have coarse rales to her lower lung  fields, not hypoxic on room air.  Abdomen is soft and compressible throughout.  Neurological examination is at baseline.  5 out of 5 strength bilateral lower extremities.  Will collect lactate, blood cultures, CBC, BMP, viral panel, CK, urinalysis, type and screen, troponins, chest x-ray.  Patient given Tylenol for fever.  Patient CBC  without leukocytosis but hemoglobin is 6.6 most likely secondary to anemia of chronic disease.  Metabolic panel shows creatinine 5.82, anion gap 17, bicarb 10, BUN 82.  Viral panel negative for all.  CK WNL.  Lactic acid not elevated.  Troponin elevated 131, no ischemia on EKG.  Delta pending at this time.  Urinalysis pending at this time.  Chest x-ray shows pleural effusions without obvious evidence of pneumonia.  At this time, patient with fever of unknown origin.  Patient provided Rocephin.  Patient will also receive MRI lumbar spine to rule out epidural abscess.  Patient will require admission to the hospital due to anemia however at this time still has pending labs and imaging studies.  Will sign patient out to oncoming provider [facility].  Plan of management discussed with ongoing provider.   Final Clinical Impression(s) / ED Diagnoses Final diagnoses:  Anemia due to chronic kidney disease, on chronic dialysis (HCC)  Shortness of breath  Chest pain, unspecified type    Rx / DC Orders ED Discharge Orders     None         Clent Ridges 04/15/23 2330    Anders Simmonds T, DO 04/17/23 1152

## 2023-04-15 NOTE — ED Notes (Signed)
Hgb 6.6, PA made aware.

## 2023-04-15 NOTE — ED Triage Notes (Signed)
Presents from home for bilateral leg, abd, and back pain after walking a lot a few days ago (homeless), increased SOB x1 day.  She has not been to HD in 3 month because she feels she no longer needs it. Fistula R arm  EMS eval and VS: lower lungs diminished, 89% on RA, on 2L O2 improved to 97%,   H/o COPD  No meds en route, decline tylenol

## 2023-04-15 NOTE — ED Notes (Signed)
This RN rounded on patient, she is AxOx4, however she is ill appearing and is now febrile. This RN informed charge RN that the patient needs to be placed in a room.

## 2023-04-16 ENCOUNTER — Inpatient Hospital Stay (HOSPITAL_COMMUNITY): Payer: Medicaid Other

## 2023-04-16 ENCOUNTER — Emergency Department (HOSPITAL_COMMUNITY): Payer: Medicare Other

## 2023-04-16 DIAGNOSIS — I120 Hypertensive chronic kidney disease with stage 5 chronic kidney disease or end stage renal disease: Secondary | ICD-10-CM | POA: Diagnosis present

## 2023-04-16 DIAGNOSIS — R0602 Shortness of breath: Secondary | ICD-10-CM

## 2023-04-16 DIAGNOSIS — Y832 Surgical operation with anastomosis, bypass or graft as the cause of abnormal reaction of the patient, or of later complication, without mention of misadventure at the time of the procedure: Secondary | ICD-10-CM | POA: Diagnosis present

## 2023-04-16 DIAGNOSIS — J4489 Other specified chronic obstructive pulmonary disease: Secondary | ICD-10-CM | POA: Diagnosis present

## 2023-04-16 DIAGNOSIS — Z91199 Patient's noncompliance with other medical treatment and regimen due to unspecified reason: Secondary | ICD-10-CM | POA: Diagnosis not present

## 2023-04-16 DIAGNOSIS — T827XXA Infection and inflammatory reaction due to other cardiac and vascular devices, implants and grafts, initial encounter: Secondary | ICD-10-CM

## 2023-04-16 DIAGNOSIS — F32A Depression, unspecified: Secondary | ICD-10-CM | POA: Diagnosis present

## 2023-04-16 DIAGNOSIS — Z560 Unemployment, unspecified: Secondary | ICD-10-CM | POA: Diagnosis not present

## 2023-04-16 DIAGNOSIS — D649 Anemia, unspecified: Secondary | ICD-10-CM | POA: Diagnosis not present

## 2023-04-16 DIAGNOSIS — L03113 Cellulitis of right upper limb: Secondary | ICD-10-CM | POA: Diagnosis present

## 2023-04-16 DIAGNOSIS — Z66 Do not resuscitate: Secondary | ICD-10-CM | POA: Diagnosis present

## 2023-04-16 DIAGNOSIS — B1920 Unspecified viral hepatitis C without hepatic coma: Secondary | ICD-10-CM | POA: Diagnosis present

## 2023-04-16 DIAGNOSIS — G894 Chronic pain syndrome: Secondary | ICD-10-CM | POA: Diagnosis present

## 2023-04-16 DIAGNOSIS — N185 Chronic kidney disease, stage 5: Secondary | ICD-10-CM | POA: Diagnosis present

## 2023-04-16 DIAGNOSIS — F1410 Cocaine abuse, uncomplicated: Secondary | ICD-10-CM | POA: Diagnosis present

## 2023-04-16 DIAGNOSIS — Z992 Dependence on renal dialysis: Secondary | ICD-10-CM

## 2023-04-16 DIAGNOSIS — N186 End stage renal disease: Secondary | ICD-10-CM | POA: Diagnosis not present

## 2023-04-16 DIAGNOSIS — E876 Hypokalemia: Secondary | ICD-10-CM | POA: Diagnosis present

## 2023-04-16 DIAGNOSIS — Z7189 Other specified counseling: Secondary | ICD-10-CM | POA: Diagnosis not present

## 2023-04-16 DIAGNOSIS — E872 Acidosis, unspecified: Secondary | ICD-10-CM | POA: Diagnosis present

## 2023-04-16 DIAGNOSIS — E43 Unspecified severe protein-calorie malnutrition: Secondary | ICD-10-CM | POA: Diagnosis present

## 2023-04-16 DIAGNOSIS — F419 Anxiety disorder, unspecified: Secondary | ICD-10-CM

## 2023-04-16 DIAGNOSIS — E785 Hyperlipidemia, unspecified: Secondary | ICD-10-CM | POA: Diagnosis present

## 2023-04-16 DIAGNOSIS — I10 Essential (primary) hypertension: Secondary | ICD-10-CM

## 2023-04-16 DIAGNOSIS — F17210 Nicotine dependence, cigarettes, uncomplicated: Secondary | ICD-10-CM | POA: Diagnosis present

## 2023-04-16 DIAGNOSIS — Z515 Encounter for palliative care: Secondary | ICD-10-CM | POA: Diagnosis not present

## 2023-04-16 DIAGNOSIS — A419 Sepsis, unspecified organism: Secondary | ICD-10-CM | POA: Diagnosis present

## 2023-04-16 DIAGNOSIS — E1122 Type 2 diabetes mellitus with diabetic chronic kidney disease: Secondary | ICD-10-CM | POA: Diagnosis present

## 2023-04-16 DIAGNOSIS — F17200 Nicotine dependence, unspecified, uncomplicated: Secondary | ICD-10-CM

## 2023-04-16 DIAGNOSIS — D631 Anemia in chronic kidney disease: Secondary | ICD-10-CM | POA: Diagnosis present

## 2023-04-16 DIAGNOSIS — Z681 Body mass index (BMI) 19 or less, adult: Secondary | ICD-10-CM | POA: Diagnosis not present

## 2023-04-16 DIAGNOSIS — T82510D Breakdown (mechanical) of surgically created arteriovenous fistula, subsequent encounter: Secondary | ICD-10-CM | POA: Diagnosis not present

## 2023-04-16 DIAGNOSIS — F1510 Other stimulant abuse, uncomplicated: Secondary | ICD-10-CM | POA: Diagnosis present

## 2023-04-16 DIAGNOSIS — Z1152 Encounter for screening for COVID-19: Secondary | ICD-10-CM | POA: Diagnosis not present

## 2023-04-16 DIAGNOSIS — J449 Chronic obstructive pulmonary disease, unspecified: Secondary | ICD-10-CM

## 2023-04-16 DIAGNOSIS — Z5900 Homelessness unspecified: Secondary | ICD-10-CM | POA: Diagnosis not present

## 2023-04-16 HISTORY — DX: Infection and inflammatory reaction due to other cardiac and vascular devices, implants and grafts, initial encounter: T82.7XXA

## 2023-04-16 LAB — CBC WITH DIFFERENTIAL/PLATELET
Abs Immature Granulocytes: 0 10*3/uL (ref 0.00–0.07)
Basophils Absolute: 0 10*3/uL (ref 0.0–0.1)
Basophils Relative: 1 %
Eosinophils Absolute: 0 10*3/uL (ref 0.0–0.5)
Eosinophils Relative: 0 %
HCT: 19.8 % — ABNORMAL LOW (ref 36.0–46.0)
Hemoglobin: 6.6 g/dL — CL (ref 12.0–15.0)
Lymphocytes Relative: 25 %
Lymphs Abs: 1.2 10*3/uL (ref 0.7–4.0)
MCH: 33.2 pg (ref 26.0–34.0)
MCHC: 33.3 g/dL (ref 30.0–36.0)
MCV: 99.5 fL (ref 80.0–100.0)
Monocytes Absolute: 0.1 10*3/uL (ref 0.1–1.0)
Monocytes Relative: 2 %
Neutro Abs: 3.5 10*3/uL (ref 1.7–7.7)
Neutrophils Relative %: 72 %
Platelets: 211 10*3/uL (ref 150–400)
RBC: 1.99 MIL/uL — ABNORMAL LOW (ref 3.87–5.11)
RDW: 15 % (ref 11.5–15.5)
WBC: 4.8 10*3/uL (ref 4.0–10.5)
nRBC: 0 % (ref 0.0–0.2)
nRBC: 0 /100{WBCs}

## 2023-04-16 LAB — VITAMIN B12: Vitamin B-12: 387 pg/mL (ref 180–914)

## 2023-04-16 LAB — TROPONIN I (HIGH SENSITIVITY)
Troponin I (High Sensitivity): 135 ng/L (ref <18)
Troponin I (High Sensitivity): 137 ng/L (ref <18)

## 2023-04-16 LAB — MAGNESIUM: Magnesium: 1.7 mg/dL (ref 1.7–2.4)

## 2023-04-16 LAB — FOLATE: Folate: >40 ng/mL (ref >5.9)

## 2023-04-16 LAB — COMPREHENSIVE METABOLIC PANEL
ALT: 18 U/L (ref 0–44)
AST: 17 U/L (ref 15–41)
Albumin: 2.7 g/dL — ABNORMAL LOW (ref 3.5–5.0)
Alkaline Phosphatase: 98 U/L (ref 38–126)
Anion gap: 10 (ref 5–15)
BUN: 75 mg/dL — ABNORMAL HIGH (ref 8–23)
CO2: 10 mmol/L — ABNORMAL LOW (ref 22–32)
Calcium: 8 mg/dL — ABNORMAL LOW (ref 8.9–10.3)
Chloride: 110 mmol/L (ref 98–111)
Creatinine, Ser: 5.82 mg/dL — ABNORMAL HIGH (ref 0.44–1.00)
GFR, Estimated: 8 mL/min — ABNORMAL LOW (ref >60)
Glucose, Bld: 99 mg/dL (ref 70–99)
Potassium: 3.3 mmol/L — ABNORMAL LOW (ref 3.5–5.1)
Sodium: 130 mmol/L — ABNORMAL LOW (ref 135–145)
Total Bilirubin: 0.5 mg/dL (ref 0.0–1.2)
Total Protein: 5.9 g/dL — ABNORMAL LOW (ref 6.5–8.1)

## 2023-04-16 LAB — RAPID URINE DRUG SCREEN, HOSP PERFORMED
Amphetamines: POSITIVE — AB
Barbiturates: NOT DETECTED
Benzodiazepines: NOT DETECTED
Cocaine: POSITIVE — AB
Opiates: NOT DETECTED
Tetrahydrocannabinol: NOT DETECTED

## 2023-04-16 LAB — PHOSPHORUS: Phosphorus: 5.4 mg/dL — ABNORMAL HIGH (ref 2.5–4.6)

## 2023-04-16 LAB — POC OCCULT BLOOD, ED: Fecal Occult Bld: NEGATIVE

## 2023-04-16 LAB — CBC
HCT: 21 % — ABNORMAL LOW (ref 36.0–46.0)
Hemoglobin: 7 g/dL — ABNORMAL LOW (ref 12.0–15.0)
MCH: 33.3 pg (ref 26.0–34.0)
MCHC: 33.3 g/dL (ref 30.0–36.0)
MCV: 100 fL (ref 80.0–100.0)
Platelets: 211 10*3/uL (ref 150–400)
RBC: 2.1 MIL/uL — ABNORMAL LOW (ref 3.87–5.11)
RDW: 15.1 % (ref 11.5–15.5)
WBC: 5.2 10*3/uL (ref 4.0–10.5)
nRBC: 0 % (ref 0.0–0.2)

## 2023-04-16 LAB — FERRITIN: Ferritin: 269 ng/mL (ref 11–307)

## 2023-04-16 LAB — URINALYSIS, ROUTINE W REFLEX MICROSCOPIC
Bilirubin Urine: NEGATIVE
Glucose, UA: 50 mg/dL — AB
Ketones, ur: NEGATIVE mg/dL
Nitrite: NEGATIVE
Protein, ur: >=300 mg/dL — AB
Specific Gravity, Urine: 1.009 (ref 1.005–1.030)
pH: 6 (ref 5.0–8.0)

## 2023-04-16 LAB — RETICULOCYTES
Immature Retic Fract: 16.4 % — ABNORMAL HIGH (ref 2.3–15.9)
RBC.: 1.77 MIL/uL — ABNORMAL LOW (ref 3.87–5.11)
Retic Count, Absolute: 51.9 10*3/uL (ref 19.0–186.0)
Retic Ct Pct: 2.9 % (ref 0.4–3.1)

## 2023-04-16 LAB — IRON AND TIBC
Iron: 24 ug/dL — ABNORMAL LOW (ref 28–170)
Saturation Ratios: 8 % — ABNORMAL LOW (ref 10.4–31.8)
TIBC: 297 ug/dL (ref 250–450)
UIBC: 273 ug/dL

## 2023-04-16 LAB — PROTIME-INR
INR: 1.2 (ref 0.8–1.2)
Prothrombin Time: 15.8 s — ABNORMAL HIGH (ref 11.4–15.2)

## 2023-04-16 LAB — LACTIC ACID, PLASMA: Lactic Acid, Venous: 0.6 mmol/L (ref 0.5–1.9)

## 2023-04-16 LAB — PREPARE RBC (CROSSMATCH)

## 2023-04-16 MED ORDER — UMECLIDINIUM BROMIDE 62.5 MCG/ACT IN AEPB
1.0000 | INHALATION_SPRAY | Freq: Every day | RESPIRATORY_TRACT | Status: DC
  Administered 2023-04-17 – 2023-04-21 (×5): 1 via RESPIRATORY_TRACT
  Filled 2023-04-16: qty 7

## 2023-04-16 MED ORDER — HYDROXYZINE HCL 25 MG PO TABS
25.0000 mg | ORAL_TABLET | Freq: Three times a day (TID) | ORAL | Status: DC | PRN
  Administered 2023-04-16 – 2023-04-19 (×4): 25 mg via ORAL
  Filled 2023-04-16 (×4): qty 1

## 2023-04-16 MED ORDER — HEPARIN SODIUM (PORCINE) 5000 UNIT/ML IJ SOLN
5000.0000 [IU] | Freq: Three times a day (TID) | INTRAMUSCULAR | Status: DC
  Administered 2023-04-16 – 2023-04-21 (×16): 5000 [IU] via SUBCUTANEOUS
  Filled 2023-04-16 (×16): qty 1

## 2023-04-16 MED ORDER — TRAZODONE HCL 50 MG PO TABS
50.0000 mg | ORAL_TABLET | Freq: Every evening | ORAL | Status: DC | PRN
  Administered 2023-04-17 – 2023-04-20 (×4): 50 mg via ORAL
  Filled 2023-04-16 (×4): qty 1

## 2023-04-16 MED ORDER — PIPERACILLIN-TAZOBACTAM IN DEX 2-0.25 GM/50ML IV SOLN
2.2500 g | Freq: Three times a day (TID) | INTRAVENOUS | Status: DC
  Administered 2023-04-16 – 2023-04-18 (×6): 2.25 g via INTRAVENOUS
  Filled 2023-04-16 (×9): qty 50

## 2023-04-16 MED ORDER — ONDANSETRON HCL 4 MG PO TABS: 4.0000 mg | ORAL_TABLET | Freq: Four times a day (QID) | ORAL | Status: DC | PRN

## 2023-04-16 MED ORDER — SENNOSIDES-DOCUSATE SODIUM 8.6-50 MG PO TABS: 1.0000 | ORAL_TABLET | Freq: Every evening | ORAL | Status: DC | PRN

## 2023-04-16 MED ORDER — RENA-VITE PO TABS
1.0000 | ORAL_TABLET | Freq: Every day | ORAL | Status: DC
  Administered 2023-04-16 – 2023-04-20 (×5): 1 via ORAL
  Filled 2023-04-16 (×6): qty 1

## 2023-04-16 MED ORDER — SODIUM CHLORIDE 0.9% IV SOLUTION: Freq: Once | INTRAVENOUS | Status: AC

## 2023-04-16 MED ORDER — GABAPENTIN 100 MG PO CAPS
100.0000 mg | ORAL_CAPSULE | Freq: Three times a day (TID) | ORAL | Status: DC
  Administered 2023-04-16 – 2023-04-21 (×15): 100 mg via ORAL
  Filled 2023-04-16 (×15): qty 1

## 2023-04-16 MED ORDER — VANCOMYCIN HCL IN DEXTROSE 1-5 GM/200ML-% IV SOLN
1000.0000 mg | Freq: Once | INTRAVENOUS | Status: AC
  Administered 2023-04-16: 1000 mg via INTRAVENOUS
  Filled 2023-04-16: qty 200

## 2023-04-16 MED ORDER — ONDANSETRON HCL 4 MG/2ML IJ SOLN: 4.0000 mg | Freq: Four times a day (QID) | INTRAMUSCULAR | Status: DC | PRN

## 2023-04-16 MED ORDER — ALBUTEROL SULFATE (2.5 MG/3ML) 0.083% IN NEBU: 3.0000 mL | INHALATION_SOLUTION | Freq: Four times a day (QID) | RESPIRATORY_TRACT | Status: DC | PRN

## 2023-04-16 MED ORDER — NICOTINE 14 MG/24HR TD PT24
14.0000 mg | MEDICATED_PATCH | Freq: Every day | TRANSDERMAL | Status: DC
  Administered 2023-04-16 – 2023-04-21 (×6): 14 mg via TRANSDERMAL
  Filled 2023-04-16 (×6): qty 1

## 2023-04-16 MED ORDER — DARBEPOETIN ALFA 60 MCG/0.3ML IJ SOSY
60.0000 ug | PREFILLED_SYRINGE | INTRAMUSCULAR | Status: DC
  Administered 2023-04-16: 60 ug via SUBCUTANEOUS
  Filled 2023-04-16: qty 0.3

## 2023-04-16 MED ORDER — FERROUS SULFATE 325 (65 FE) MG PO TABS: 325.0000 mg | ORAL_TABLET | Freq: Every day | ORAL | Status: DC

## 2023-04-16 MED ORDER — ACETAMINOPHEN 325 MG PO TABS
650.0000 mg | ORAL_TABLET | Freq: Four times a day (QID) | ORAL | Status: DC | PRN
  Administered 2023-04-16 – 2023-04-20 (×7): 650 mg via ORAL
  Filled 2023-04-16 (×6): qty 2

## 2023-04-16 MED ORDER — ACETAMINOPHEN 650 MG RE SUPP: 650.0000 mg | Freq: Four times a day (QID) | RECTAL | Status: DC | PRN

## 2023-04-16 MED ORDER — BUSPIRONE HCL 10 MG PO TABS
10.0000 mg | ORAL_TABLET | Freq: Two times a day (BID) | ORAL | Status: DC
  Administered 2023-04-16 – 2023-04-21 (×10): 10 mg via ORAL
  Filled 2023-04-16 (×10): qty 1

## 2023-04-16 MED ORDER — SODIUM BICARBONATE 650 MG PO TABS
650.0000 mg | ORAL_TABLET | Freq: Three times a day (TID) | ORAL | Status: DC
  Administered 2023-04-16 – 2023-04-17 (×4): 650 mg via ORAL
  Filled 2023-04-16 (×4): qty 1

## 2023-04-16 MED ORDER — IRON SUCROSE 200 MG IVPB - SIMPLE MED
200.0000 mg | Freq: Every day | Status: AC
  Administered 2023-04-16 – 2023-04-18 (×3): 200 mg via INTRAVENOUS
  Filled 2023-04-16: qty 110
  Filled 2023-04-16: qty 200
  Filled 2023-04-16 (×2): qty 10

## 2023-04-16 MED ORDER — HYDROMORPHONE HCL 1 MG/ML IJ SOLN: 0.5000 mg | INTRAMUSCULAR | Status: DC | PRN

## 2023-04-16 MED ORDER — VANCOMYCIN VARIABLE DOSE PER UNSTABLE RENAL FUNCTION (PHARMACIST DOSING): Status: DC

## 2023-04-16 MED ORDER — SODIUM BICARBONATE 650 MG PO TABS: 650.0000 mg | ORAL_TABLET | Freq: Three times a day (TID) | ORAL | Status: DC

## 2023-04-16 MED ORDER — IPRATROPIUM-ALBUTEROL 0.5-2.5 (3) MG/3ML IN SOLN
3.0000 mL | Freq: Four times a day (QID) | RESPIRATORY_TRACT | Status: DC | PRN
Start: 2023-04-16 — End: 2023-04-21

## 2023-04-16 MED ORDER — HYDROMORPHONE HCL 1 MG/ML IJ SOLN
0.5000 mg | Freq: Once | INTRAMUSCULAR | Status: AC
  Administered 2023-04-16: 0.5 mg via INTRAVENOUS
  Filled 2023-04-16: qty 1

## 2023-04-16 MED ORDER — PIPERACILLIN-TAZOBACTAM 3.375 G IVPB 30 MIN
3.3750 g | Freq: Once | INTRAVENOUS | Status: AC
Start: 2023-04-16 — End: 2023-04-16
  Administered 2023-04-16: 3.375 g via INTRAVENOUS
  Filled 2023-04-16: qty 50

## 2023-04-16 MED ORDER — PANTOPRAZOLE SODIUM 40 MG PO TBEC
40.0000 mg | DELAYED_RELEASE_TABLET | Freq: Every day | ORAL | Status: DC
  Administered 2023-04-17 – 2023-04-21 (×5): 40 mg via ORAL
  Filled 2023-04-16 (×5): qty 1

## 2023-04-16 MED ORDER — FLUTICASONE FUROATE-VILANTEROL 100-25 MCG/ACT IN AEPB
1.0000 | INHALATION_SPRAY | Freq: Every day | RESPIRATORY_TRACT | Status: DC
  Administered 2023-04-17 – 2023-04-21 (×5): 1 via RESPIRATORY_TRACT
  Filled 2023-04-16 (×2): qty 28

## 2023-04-16 NOTE — Progress Notes (Signed)
PT Cancellation Note  Patient Details Name: [name] MRN: [id number] DOB: 05/04/1960   Cancelled Treatment:    Reason Eval/Treat Not Completed: Patient at procedure or test/unavailable Micah Flesher to perform pt Evaluation. Pt off the floor. Location still states pt in room. Will continue to follow up as able and appropriate.)  Harrel Carina, DPT, CLT  Acute Rehabilitation Services Office: [phone] (Secure chat preferred)   Claudia Desanctis 04/16/2023, 3:13 PM

## 2023-04-16 NOTE — Consult Note (Signed)
Reason for Consult: CKD stage V Referring Physician: Luciano Cutter Bilodeau is an 63 y.o. female with a PMH significant for hypertension, Hep C, h/o CHF, COPD with ongoing tobacco use, history of polysubstance abuse, social difficulties including homelessness and end-stage renal disease. She has unfortunately had spotty adherence to outpatient dialysis due to lack of a stable housing situation and has not been there since her discharge from the hospital in November 2024.  She presented to [facility] ED on 04/15/23 from home c/o 3 day history of generalized body aches and increased SOB for the pat 2 days.  She also complained of generalized weakness and fevers but no chills, cough, dysuria, pyuria, hematuria.  She does complain of right arm pain over the fistula.  In the ED, Tmax 101, Bp 163/68, HR 109, RR 22, SpO2 100% on room air.  Labs notable for Hgb 6.6, WBC 7.8, Co2 10, BUN 82, Cr 5.82, Ca 9.1.  UDS + for cocaine and amphetamies.  CXR with small right pleural effusion.  MRI of lumbar spine without evidence of discitis/osteo.  We were consulted to provide dialysis during her hospitalization.  She was seen by Dr. Randie Heinz of [facility] to evaluate right arm AVF and will order duplex and wanted her to start antibiotics for presumed cellulitis.    Trend in Creatinine: Creatinine, Ser  Date/Time Value Ref Range Status  04/16/2023 09:52 AM 5.82 (H) 0.44 - 1.00 mg/dL Final  40/98/1191 47:82 PM 5.82 (H) 0.44 - 1.00 mg/dL Final  95/62/1308 65:78 PM 5.07 (H) 0.44 - 1.00 mg/dL Final  46/96/2952 84:13 AM 4.86 (H) 0.44 - 1.00 mg/dL Final  24/40/1027 25:36 AM 4.83 (H) 0.44 - 1.00 mg/dL Final  64/40/3474 25:95 AM 4.79 (H) 0.44 - 1.00 mg/dL Final  63/87/5643 32:95 AM 4.37 (H) 0.44 - 1.00 mg/dL Final  18/84/1660 63:01 PM 4.90 (H) 0.44 - 1.00 mg/dL Final  60/12/9321 55:73 AM 4.67 (H) 0.44 - 1.00 mg/dL Final  22/04/5425 06:23 AM 4.59 (H) 0.44 - 1.00 mg/dL Final  76/28/3151 76:16 AM 5.86 (H) 0.44 - 1.00 mg/dL Final  07/37/1062  69:48 PM 6.30 (H) 0.44 - 1.00 mg/dL Final  54/62/7035 00:93 PM 5.91 (H) 0.44 - 1.00 mg/dL Final  81/82/9937 16:96 AM 1.95 (H) 0.44 - 1.00 mg/dL Final  78/93/8101 75:10 AM 3.76 (H) 0.44 - 1.00 mg/dL Final  25/85/2778 24:23 PM 1.47 (H) 0.44 - 1.00 mg/dL Final  53/61/4431 54:00 AM 2.30 (H) 0.44 - 1.00 mg/dL Final  86/76/1950 93:26 AM 3.41 (H) 0.44 - 1.00 mg/dL Final  71/24/5809 98:33 AM 2.57 (H) 0.44 - 1.00 mg/dL Final  82/50/5397 67:34 AM 3.92 (H) 0.44 - 1.00 mg/dL Final  19/37/9024 09:73 AM 4.08 (H) 0.44 - 1.00 mg/dL Final  53/29/9242 68:34 AM 3.70 (H) 0.44 - 1.00 mg/dL Final  19/62/2297 98:92 AM 2.81 (H) 0.44 - 1.00 mg/dL Final  11/94/1740 81:44 AM 5.56 (H) 0.44 - 1.00 mg/dL Final  81/85/6314 97:02 AM 5.24 (H) 0.44 - 1.00 mg/dL Final  63/78/5885 02:77 PM 5.20 (H) 0.44 - 1.00 mg/dL Final  41/28/7867 67:20 AM 2.43 (H) 0.44 - 1.00 mg/dL Final  94/70/9628 36:62 AM 3.47 (H) 0.44 - 1.00 mg/dL Final  94/76/5465 03:54 AM 3.25 (H) 0.44 - 1.00 mg/dL Final  65/68/1275 17:00 AM 2.44 (H) 0.44 - 1.00 mg/dL Final  17/49/4496 75:91 AM 4.21 (H) 0.44 - 1.00 mg/dL Final  63/84/6659 93:57 AM 2.93 (H) 0.44 - 1.00 mg/dL Final  01/77/9390 30:09 AM 2.24 (H) 0.44 - 1.00 mg/dL Final  10/25/2022 08:53 PM 4.69 (H) 0.44 - 1.00 mg/dL Final  16/12/9602 54:09 PM 4.80 (H) 0.44 - 1.00 mg/dL Final  81/19/1478 29:56 PM 5.30 (H) 0.44 - 1.00 mg/dL Final  21/30/8657 84:69 AM 2.89 (H) 0.44 - 1.00 mg/dL Final  62/95/2841 32:44 AM 3.51 (H) 0.44 - 1.00 mg/dL Final  03/20/7251 66:44 AM 3.01 (H) 0.44 - 1.00 mg/dL Final  03/47/4259 56:38 AM 2.57 (H) 0.44 - 1.00 mg/dL Final  75/64/3329 51:88 AM 3.60 (H) 0.44 - 1.00 mg/dL Final  41/66/0630 16:01 AM 3.16 (H) 0.44 - 1.00 mg/dL Final  09/32/3557 32:20 AM 4.56 (H) 0.44 - 1.00 mg/dL Final  25/42/7062 37:62 AM 4.78 (H) 0.44 - 1.00 mg/dL Final  83/15/1761 60:73 PM 4.68 (H) 0.44 - 1.00 mg/dL Final  71/08/2692 85:46 AM 4.19 (H) 0.44 - 1.00 mg/dL Final  27/05/5007 38:18 AM 4.22 (H) 0.44 -  1.00 mg/dL Final  29/93/7169 67:89 AM 4.39 (H) 0.44 - 1.00 mg/dL Final  38/12/1749 02:58 AM 4.18 (H) 0.44 - 1.00 mg/dL Final  52/77/8242 35:36 PM 4.71 (H) 0.44 - 1.00 mg/dL Final  14/43/1540 08:67 PM 3.97 (H) 0.44 - 1.00 mg/dL Final  61/95/0932 67:12 AM 4.07 (H) 0.44 - 1.00 mg/dL Final   Creat  Date/Time Value Ref Range Status  04/29/2016 10:47 AM 1.64 (H) 0.50 - 1.05 mg/dL Final  45/80/9983 38:25 AM 1.37 (H) 0.50 - 1.05 mg/dL Final  05/39/7673 41:93 AM 1.13 (H) 0.50 - 1.05 mg/dL Final  79/04/4095 35:32 AM 1.02 0.50 - 1.05 mg/dL Final  99/24/2683 41:96 PM 1.27 (H) 0.50 - 1.10 mg/dL Final  22/29/7989 21:19 AM 0.76 0.50 - 1.10 mg/dL Final    PMH:   Past Medical History:  Diagnosis Date   Alcohol abuse    Allergy    Anxiety    Arthritis    Asthma    Cannabis abuse    Cocaine abuse (HCC)    COPD (chronic obstructive pulmonary disease) (HCC)    Depression    Drug addiction (HCC)    GERD (gastroesophageal reflux disease)    Heart murmur    Homelessness    Hyperlipidemia    Hypertension    pt stated "every once in a while BP will be high but has not been prescribed medication for HTN.    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) 02/22/2016    PSH:   Past Surgical History:  Procedure Laterality Date   A/V FISTULAGRAM Right 01/31/2023   Procedure: A/V Fistulagram;  Surgeon: Cephus Shelling, MD;  Location: [facility]IVE [facility];  Service: Cardiovascular;  Laterality: Right;   AV FISTULA PLACEMENT Right 08/14/2022   Procedure: RIGHT ARM BRACHIOBASILIC ATERIOVENOUS FISTULA CREATION;  Surgeon: Leonie Douglas, MD;  Location: [facility] OR;  Service: Vascular;  Laterality: Right;   BASCILIC VEIN TRANSPOSITION Right 11/13/2022   Procedure: RIGHT ARM SECOND STAGE BASILIC VEIN TRANSPOSITION;  Surgeon: Leonie Douglas, MD;  [facility];  Service: Vascular;   Laterality: Right;   BIOPSY  01/01/2019   Procedure: BIOPSY;  Surgeon: Beverley Fiedler, MD;  Location: [facility] ENDOSCOPY;  Service: Endoscopy;;   BIOPSY  11/17/2021   Procedure: BIOPSY;  Surgeon: Lynann Bologna, MD;  Location: [facility] ENDOSCOPY;  Service: Gastroenterology;;   CESAREAN SECTION  1989   COLONOSCOPY  11/07/2020   2018   CYSTOSCOPY W/ URETERAL STENT PLACEMENT Left  08/01/2018   Procedure: CYSTOSCOPY WITH RETROGRADE PYELOGRAM/URETERAL STENT PLACEMENT;  Surgeon: Crista Elliot, MD;  Location: [facility] ORS;  Service: Urology;  Laterality: Left;   CYSTOSCOPY WITH RETROGRADE PYELOGRAM, URETEROSCOPY AND STENT PLACEMENT Left 01/15/2019   Procedure: CYSTOSCOPY WITH RETROGRADE PYELOGRAM, URETEROSCOPY AND STENT PLACEMENT;  Surgeon: Crista Elliot, MD;  Location: [facility] ORS;  Service: Urology;  Laterality: Left;   ENTEROSCOPY N/A 08/16/2022   Procedure: ENTEROSCOPY;  Surgeon: Meridee Score Netty Starring., MD;  Location: [facility] ENDOSCOPY;  Service: Gastroenterology;  Laterality: N/A;   ENTEROSCOPY N/A 10/31/2022   Procedure: ENTEROSCOPY;  Surgeon: Beverley Fiedler, MD;  Location: [facility] ENDOSCOPY;  Service: Gastroenterology;  Laterality: N/A;   ESOPHAGOGASTRODUODENOSCOPY (EGD) WITH PROPOFOL N/A 01/01/2019   Procedure: ESOPHAGOGASTRODUODENOSCOPY (EGD) WITH PROPOFOL;  Surgeon: Beverley Fiedler, MD;  Location: [facility] ENDOSCOPY;  Service: Endoscopy;  Laterality: N/A;   ESOPHAGOGASTRODUODENOSCOPY (EGD) WITH PROPOFOL N/A 08/21/2019   Procedure: ESOPHAGOGASTRODUODENOSCOPY (EGD) WITH PROPOFOL;  Surgeon: Napoleon Form, MD;  Location: [facility] ENDOSCOPY;  Service: Endoscopy;  Laterality: N/A;   ESOPHAGOGASTRODUODENOSCOPY (EGD) WITH PROPOFOL N/A 11/17/2021   Procedure: ESOPHAGOGASTRODUODENOSCOPY (EGD) WITH PROPOFOL;  Surgeon: Lynann Bologna, MD;  Location: [facility] ENDOSCOPY;  Service: Gastroenterology;  Laterality: N/A;   FRACTURE SURGERY Left 2011   arm   HEMOSTASIS CLIP PLACEMENT  08/16/2022   Procedure: HEMOSTASIS CLIP PLACEMENT;  Surgeon:  Lemar Lofty., MD;  Location: [facility] ENDOSCOPY;  Service: Gastroenterology;;   HOT HEMOSTASIS N/A 08/16/2022   Procedure: HOT HEMOSTASIS (ARGON PLASMA COAGULATION/BICAP);  Surgeon: Lemar Lofty., MD;  Location: [facility] ENDOSCOPY;  Service: Gastroenterology;  Laterality: N/A;   HOT HEMOSTASIS N/A 10/31/2022   Procedure: HOT HEMOSTASIS (ARGON PLASMA COAGULATION/BICAP);  Surgeon: Beverley Fiedler, MD;  Location: [facility] ENDOSCOPY;  Service: Gastroenterology;  Laterality: N/A;   INSERTION OF DIALYSIS CATHETER Right 08/14/2022   Procedure: INSERTION OF TUNNELED DIALYSIS CATHETER USING PALINDROME CATHETER KIT 19CM;  Surgeon: Leonie Douglas, MD;  Location: [facility] OR;  Service: Vascular;  Laterality: Right;   POLYPECTOMY  08/16/2022   Procedure: POLYPECTOMY;  Surgeon: Lemar Lofty., MD;  Location: [facility] ENDOSCOPY;  Service: Gastroenterology;;   SUBMUCOSAL TATTOO INJECTION  08/16/2022   Procedure: SUBMUCOSAL TATTOO INJECTION;  Surgeon: Lemar Lofty., MD;  Location: [facility] ENDOSCOPY;  Service: Gastroenterology;;   UPPER GASTROINTESTINAL ENDOSCOPY      Allergies:  Allergies  Allergen Reactions   Zestril [Lisinopril] Other (See Comments)    Hyperkalemia    Medications:   Prior to Admission medications   Medication Sig Start Date End Date Taking? Authorizing Provider  acetaminophen (TYLENOL) 500 MG tablet Take 2 tablets (1,000 mg total) by mouth 2 (two) times daily as needed for moderate pain (pain score 4-6), fever or headache. 02/01/23  Yes Ghimire, Werner Lean, MD  albuterol (VENTOLIN HFA) 108 (90 Base) MCG/ACT inhaler Inhale 2 puffs into the lungs every 6 (six) hours as needed for wheezing or shortness of breath. Patient taking differently: Inhale 3 puffs into the lungs every 6 (six) hours as needed for wheezing or shortness of breath. 02/01/23  Yes Ghimire, Werner Lean, MD  ascorbic acid (VITAMIN C) 500 MG tablet Take 500 mg by mouth daily.   Yes [provider]  atorvastatin  (LIPITOR) 10 MG tablet Take 1 tablet (10 mg total) by mouth daily. 02/01/23  Yes Ghimire, Werner Lean, MD  B Complex Vitamins (VITAMIN B COMPLEX PO) Take 1 tablet by mouth daily. Unknown strength   Yes [provider]  busPIRone (BUSPAR) 10 MG tablet  Take 1 tablet (10 mg total) by mouth 2 (two) times daily. 02/10/23  Yes Anders Simmonds, PA-C  ferrous sulfate 325 (65 FE) MG tablet Take 1 tablet (325 mg total) by mouth daily with breakfast. 02/01/23  Yes Ghimire, Werner Lean, MD  Fluticasone-Umeclidin-Vilant (TRELEGY ELLIPTA) 100-62.5-25 MCG/ACT AEPB Inhale 1 puff into the lungs daily. 02/01/23  Yes Ghimire, Werner Lean, MD  gabapentin (NEURONTIN) 100 MG capsule Take 1 capsule (100 mg total) by mouth 3 (three) times daily. 02/01/23  Yes Ghimire, Werner Lean, MD  hydrOXYzine (ATARAX) 25 MG tablet Take 1 tablet (25 mg total) by mouth 3 (three) times daily as needed for anxiety or itching. 02/01/23  Yes Ghimire, Werner Lean, MD  multivitamin (RENA-VIT) TABS tablet Take 1 tablet by mouth at bedtime. 02/01/23  Yes Ghimire, Werner Lean, MD  pantoprazole (PROTONIX) 40 MG tablet Take 1 tablet (40mg ) by mouth daily 02/01/23  Yes Ghimire, Werner Lean, MD  sodium bicarbonate 650 MG tablet Take 1 tablet (650 mg total) by mouth 3 (three) times daily. 02/01/23  Yes Ghimire, Werner Lean, MD  torsemide (DEMADEX) 100 MG tablet Take 1 tablet (100 mg total) by mouth daily. 02/01/23  Yes Ghimire, Werner Lean, MD  traZODone (DESYREL) 50 MG tablet Take 1 tablet (50 mg total) by mouth at bedtime as needed for sleep. 02/01/23  Yes Ghimire, Werner Lean, MD  ipratropium-albuterol (DUONEB) 0.5-2.5 (3) MG/3ML SOLN Take 3 mLs by nebulization every 6 (six) hours as needed (shortness of breath and wheezing). Patient not taking: Reported on 04/15/2023 02/01/23   Maretta Bees, MD    Inpatient medications:  sodium chloride   Intravenous Once   busPIRone  10 mg Oral BID   ferrous sulfate  325 mg Oral Q breakfast   fluticasone  furoate-vilanterol  1 puff Inhalation Daily   And   umeclidinium bromide  1 puff Inhalation Daily   gabapentin  100 mg Oral TID   heparin  5,000 Units Subcutaneous Q8H   multivitamin  1 tablet Oral QHS   nicotine  14 mg Transdermal Daily   pantoprazole  40 mg Oral Daily   sodium bicarbonate  650 mg Oral TID   vancomycin variable dose per unstable renal function (pharmacist dosing)   Does not apply See admin instructions    Discontinued Meds:   Medications Discontinued During This Encounter  Medication Reason   gentamicin (GARAMYCIN) 0.3 % ophthalmic solution Patient Preference   acetaminophen (TYLENOL) tablet 650 mg    sodium bicarbonate tablet 650 mg    HYDROmorphone (DILAUDID) injection 0.5 mg    albuterol (PROVENTIL) (2.5 MG/3ML) 0.083% nebulizer solution 3 mL     Social History:  reports that she has been smoking cigarettes. She has been exposed to tobacco smoke. She has never used smokeless tobacco. She reports that she does not currently use alcohol. She reports current drug use. Drug: Marijuana.  Family History:   Family History  Problem Relation Age of Onset   Diabetes Father    Colon cancer Neg Hx    Esophageal cancer Neg Hx    Rectal cancer Neg Hx    Stomach cancer Neg Hx     Pertinent items are noted in HPI. Weight change:   Intake/Output Summary (Last 24 hours) at 04/16/2023 1206 Last data filed at 04/16/2023 0604 Gross per 24 hour  Intake 395.87 ml  Output --  Net 395.87 ml   BP 124/61   Pulse 76   Temp 98.8 F (37.1 C) (Oral)  Resp 17   Wt 49.9 kg   SpO2 99%   BMI 17.23 kg/m  Vitals:   04/16/23 0415 04/16/23 0558 04/16/23 0645 04/16/23 0959  BP: (!) 170/62 (!) 169/79 (!) 158/63 124/61  Pulse: 91 92 86 76  Resp: 14 20 15 17   Temp: (!) 100.9 F (38.3 C) (!) 101 F (38.3 C)  98.8 F (37.1 C)  TempSrc:  Oral  Oral  SpO2: 100%  100% 99%  Weight:         General appearance: alert, cooperative, and no distress Head: Normocephalic, without  obvious abnormality, atraumatic Resp: clear to auscultation bilaterally Cardio: regular rate and rhythm, S1, S2 normal, no murmur, click, rub or gallop GI: soft, non-tender; bowel sounds normal; no masses,  no organomegaly Extremities: extremities normal, atraumatic, no cyanosis or edema and RUE AVF with surrounding erythema and some edema, tender to palpation  Labs: Basic Metabolic Panel: Recent Labs  Lab 04/15/23 1616 04/16/23 0952  NA 135 130*  K 4.3 3.3*  CL 108 110  CO2 10* 10*  GLUCOSE 111* 99  BUN 82* 75*  CREATININE 5.82* 5.82*  ALBUMIN  --  2.7*  CALCIUM 9.1 8.0*  PHOS  --  5.4*   Liver Function Tests: Recent Labs  Lab 04/16/23 0952  AST 17  ALT 18  ALKPHOS 98  BILITOT 0.5  PROT 5.9*  ALBUMIN 2.7*   No results for input(s): "LIPASE", "AMYLASE" in the last 168 hours. No results for input(s): "AMMONIA" in the last 168 hours. CBC: Recent Labs  Lab 04/15/23 1616 04/16/23 0953  WBC 7.8 4.8  NEUTROABS 6.6 3.5  HGB 6.6* 6.6*  HCT 19.7* 19.8*  MCV 100.5* 99.5  PLT 293 211   PT/INR: @LABRCNTIP (inr:5) Cardiac Enzymes: ) Recent Labs  Lab 04/15/23 1616  CKTOTAL 72   CBG: No results for input(s): "GLUCAP" in the last 168 hours.  Iron Studies:  Recent Labs  Lab 04/15/23 1900  IRON 24*  TIBC 297  FERRITIN 269    Xrays/Other Studies: DG Forearm Right Result Date: 04/16/2023 CLINICAL DATA:  Pain and swelling in the right elbow, initial encounter EXAM: RIGHT FOREARM - 2 VIEW COMPARISON:  None Available. FINDINGS: No acute fracture or dislocation is noted. No soft tissue abnormality is seen. Surgical clips are noted along the distal humerus. IMPRESSION: No acute abnormality noted Electronically Signed   By: Alcide Clever M.D.   On: 04/16/2023 03:12   MR Lumbar Spine W Wo Contrast Result Date: 04/16/2023 CLINICAL DATA:  concern for lumbar infection EXAM: MRI LUMBAR SPINE WITHOUT AND WITH CONTRAST TECHNIQUE: Multiplanar and multiecho pulse sequences of the  lumbar spine were obtained without and with intravenous contrast. CONTRAST:  5mL GADAVIST GADOBUTROL 1 MMOL/ML IV SOLN COMPARISON:  None Available. FINDINGS: Segmentation:  Standard. Alignment:  Normal. Vertebrae: Mild degenerative/discogenic endplate signal changes at L4-L5 and L5-S1. Otherwise, no marrow edema to suggest discitis/osteomyelitis or acute fracture. No suspicious bone lesions. No abnormal enhancement. Conus medullaris and cauda equina: Conus extends to the T12-L1 level. Conus and cauda equina appear normal. No visible canal fluid collection. Paraspinal and other soft tissues: Small left kidney. Right kidney is not visible and may be absent or atrophic. Disc levels: L1-L2: No significant disc protrusion, foraminal stenosis, or canal stenosis. L2-L3: Disc bulging and left foraminal disc protrusion. Mild canal stenosis. No significant foraminal stenosis. L3-L4: Small left foraminal disc protrusion. Resulting mild left foraminal stenosis. Patent canal and right foramen. L4-L5: Disc height loss and desiccation. Disc bulging. Mild  canal stenosis. Resulting moderate right foraminal stenosis. Patent left foramen. L5-S1: Disc height loss and desiccation. Disc bulging. Central disc protrusion. Bilateral facet arthropathy. Mild bilateral foraminal stenosis. Bilateral facet arthropathy. IMPRESSION: 1. No specific evidence of discitis/osteomyelitis or other acute abnormality. 2. Mild canal stenosis at L2-L3 and L4-L5. 3. Moderate right foraminal stenosis at L4-L5. 4. Mild foraminal stenosis on the left at L3-L4 and bilaterally at L5-S1. Electronically Signed   By: Feliberto Harts M.D.   On: 04/16/2023 00:09   DG Chest 2 View Result Date: 04/15/2023 CLINICAL DATA:  Sepsis.  Bilateral leg pain. EXAM: CHEST - 2 VIEW COMPARISON:  02/10/2023 FINDINGS: Heart size and mediastinal contours appear normal. Small right pleural effusion is improved when compared with the previous exam. No signs of interstitial edema or  airspace consolidation. Bones appear osteopenic and there is mild multilevel degenerative disc disease. IMPRESSION: Small right pleural effusion, improved when compared with the previous exam. No signs of pneumonia. Electronically Signed   By: Signa Kell M.D.   On: 04/15/2023 19:43     Assessment/Plan:  Cellulitis of RUE - seen by Dr. Randie Heinz and agree with IV antibiotics. Acute on chronic anemia of CKD stage V - will give IV iron and resume ESA and follow.  Transfuse for hgb <7. CKD stage V - no urgent indication to resume HD and would not want to use her AVF for now given pain and infection.  Would not want to place an HD catheter either given her noncompliance with HD.  Hopefully after a day or so of antibiotics we can use her avf without issue of pain.  Need palliative care to discuss pt about not resuming HD given her multiple admissions due to noncompliance.  Will continue to follow for now. Avoid nephrotoxic medications including NSAIDs and iodinated intravenous contrast exposure unless the latter is absolutely indicated.   Preferred narcotic agents for pain control are hydromorphone, fentanyl, and methadone. Morphine should not be used.  Avoid Baclofen and avoid oral sodium phosphate and magnesium citrate based laxatives / bowel preps.  Continue strict Input and Output monitoring. Will monitor the patient closely with you and intervene or adjust therapy as indicated by changes in clinical status/labs   Myalgia/chronic pain - negative covid-19, flu, and RSV PCR.  Likely due to ongoing substance use. Hypokalmeia - replete gingerly given CKD stage V. Polysubstance abuse - initially denied use.  Admitted to snorting cocaine and amphetamine after discussing her UDS.  She denied any IV drug use or skin popping in her right arm. Homelessness - will need SW assistance prior to discharge. Hepatitis c - hep C ab + will order HCV RNA.   Julien Nordmann Carina Chaplin 04/16/2023, 12:06 PM

## 2023-04-16 NOTE — H&P (Addendum)
PCP:   [name], MD   Chief Complaint:  Full body pain  HPI: This is a 63 year old female with ESRD, COPD, HLD, HTN, hep C, history of CHF, GAD, anemia.  Patient is noncompliant with hemodialysis, her last hemodialysis was approximately 3 months ago.  She does urinate occasionally.  She developed complete body pain in her legs, lower back, arm.  She came to the ER she states this is when she knows she needs hemodialysis.  Patient states she is weak, has had fevers but no chills no cough no wheeze no burning urination.  The  In the ER 163/68, 109, Tmax 100.9.  Potassium 4.3, bicarb 10, hemoglobin 6.6.  Respiratory panel negative.  Urinalysis ordered and pending, UDS pending.  CXR normal.  Guaiac negative.  CK normal.  Troponin 131=>137>135.  EKG NSR.  Blood cultures x 2 collected.  Review of Systems:  Per HPI  Past Medical History: Past Medical History:  Diagnosis Date   Alcohol abuse    Allergy    Anxiety    Arthritis    Asthma    Cannabis abuse    Cocaine abuse (HCC)    COPD (chronic obstructive pulmonary disease) (HCC)    Depression    Drug addiction (HCC)    GERD (gastroesophageal reflux disease)    Heart murmur    Homelessness    Hyperlipidemia    Hypertension    pt stated "every once in a while BP will be high but has not been prescribed medication for HTN.    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) 02/22/2016   Past Surgical History:  Procedure Laterality Date   A/V FISTULAGRAM Right 01/31/2023   Procedure: A/V Fistulagram;  Surgeon: Cephus Shelling, MD;  Location: [facility];  Service: Cardiovascular;  Laterality: Right;   AV FISTULA PLACEMENT Right 08/14/2022   Procedure: RIGHT ARM BRACHIOBASILIC ATERIOVENOUS FISTULA CREATION;  Surgeon: Leonie Douglas, MD;  Location: [facility] OR;  Service: Vascular;  Laterality: Right;    BASCILIC VEIN TRANSPOSITION Right 11/13/2022   Procedure: RIGHT ARM SECOND STAGE BASILIC VEIN TRANSPOSITION;  Surgeon: Leonie Douglas, MD;  Location: [facility] OR;  Service: Vascular;  Laterality: Right;   BIOPSY  01/01/2019   Procedure: BIOPSY;  Surgeon: Beverley Fiedler, MD;  Location: [facility] ENDOSCOPY;  Service: Endoscopy;;   BIOPSY  11/17/2021   Procedure: BIOPSY;  Surgeon: Lynann Bologna, MD;  Location: [facility] ENDOSCOPY;  Service: Gastroenterology;;   CESAREAN SECTION  1989   COLONOSCOPY  11/07/2020   2018   CYSTOSCOPY W/ URETERAL STENT PLACEMENT Left 08/01/2018   Procedure: CYSTOSCOPY WITH RETROGRADE PYELOGRAM/URETERAL STENT PLACEMENT;  Surgeon: Crista Elliot, MD;  Location: [facility] ORS;  Service: Urology;  Laterality: Left;   CYSTOSCOPY WITH RETROGRADE PYELOGRAM, URETEROSCOPY AND STENT PLACEMENT Left 01/15/2019   Procedure: CYSTOSCOPY WITH RETROGRADE PYELOGRAM, URETEROSCOPY AND STENT PLACEMENT;  Surgeon: Crista Elliot, MD;  Location: [facility] ORS;  Service: Urology;  Laterality: Left;   ENTEROSCOPY N/A 08/16/2022   Procedure: ENTEROSCOPY;  Surgeon: Meridee Score Netty Starring., MD;  Location: [facility] ENDOSCOPY;  Service: Gastroenterology;  Laterality: N/A;   ENTEROSCOPY N/A 10/31/2022   Procedure: ENTEROSCOPY;  Surgeon: Beverley Fiedler, MD;  Location: [facility] ENDOSCOPY;  Service: Gastroenterology;  Laterality: N/A;   ESOPHAGOGASTRODUODENOSCOPY (EGD) WITH PROPOFOL N/A 01/01/2019   Procedure: ESOPHAGOGASTRODUODENOSCOPY (EGD) WITH PROPOFOL;  Surgeon: Beverley Fiedler, MD;  Location: [facility] ENDOSCOPY;  Service: Endoscopy;  Laterality: N/A;   ESOPHAGOGASTRODUODENOSCOPY (EGD) WITH PROPOFOL N/A 08/21/2019   Procedure: ESOPHAGOGASTRODUODENOSCOPY (EGD) WITH PROPOFOL;  Surgeon: Napoleon Form, MD;  Location: [facility] ENDOSCOPY;  Service: Endoscopy;  Laterality: N/A;   ESOPHAGOGASTRODUODENOSCOPY (EGD) WITH PROPOFOL N/A 11/17/2021   Procedure: ESOPHAGOGASTRODUODENOSCOPY (EGD) WITH PROPOFOL;  Surgeon: Lynann Bologna, MD;  Location: [facility] ENDOSCOPY;   Service: Gastroenterology;  Laterality: N/A;   FRACTURE SURGERY Left 2011   arm   HEMOSTASIS CLIP PLACEMENT  08/16/2022   Procedure: HEMOSTASIS CLIP PLACEMENT;  Surgeon: Lemar Lofty., MD;  Location: [facility] ENDOSCOPY;  Service: Gastroenterology;;   HOT HEMOSTASIS N/A 08/16/2022   Procedure: HOT HEMOSTASIS (ARGON PLASMA COAGULATION/BICAP);  Surgeon: Lemar Lofty., MD;  Location: [facility] ENDOSCOPY;  Service: Gastroenterology;  Laterality: N/A;   HOT HEMOSTASIS N/A 10/31/2022   Procedure: HOT HEMOSTASIS (ARGON PLASMA COAGULATION/BICAP);  Surgeon: Beverley Fiedler, MD;  Location: [facility] ENDOSCOPY;  Service: Gastroenterology;  Laterality: N/A;   INSERTION OF DIALYSIS CATHETER Right 08/14/2022   Procedure: INSERTION OF TUNNELED DIALYSIS CATHETER USING PALINDROME CATHETER KIT 19CM;  Surgeon: Leonie Douglas, MD;  Location: [facility] OR;  Service: Vascular;  Laterality: Right;   POLYPECTOMY  08/16/2022   Procedure: POLYPECTOMY;  Surgeon: Lemar Lofty., MD;  Location: [facility] ENDOSCOPY;  Service: Gastroenterology;;   SUBMUCOSAL TATTOO INJECTION  08/16/2022   Procedure: SUBMUCOSAL TATTOO INJECTION;  Surgeon: Lemar Lofty., MD;  Location: [facility] ENDOSCOPY;  Service: Gastroenterology;;   UPPER GASTROINTESTINAL ENDOSCOPY      Medications: Prior to Admission medications   Medication Sig Start Date End Date Taking? Authorizing Provider  acetaminophen (TYLENOL) 500 MG tablet Take 2 tablets (1,000 mg total) by mouth 2 (two) times daily as needed for moderate pain (pain score 4-6), fever or headache. 02/01/23  Yes Ghimire, Werner Lean, MD  albuterol (VENTOLIN HFA) 108 (90 Base) MCG/ACT inhaler Inhale 2 puffs into the lungs every 6 (six) hours as needed for wheezing or shortness of breath. Patient taking differently: Inhale 3 puffs into the lungs every 6 (six) hours as needed for wheezing or shortness of breath. 02/01/23  Yes Ghimire, Werner Lean, MD  ascorbic acid (VITAMIN C) 500 MG tablet Take 500 mg by mouth  daily.   Yes [provider]  atorvastatin (LIPITOR) 10 MG tablet Take 1 tablet (10 mg total) by mouth daily. 02/01/23  Yes Ghimire, Werner Lean, MD  B Complex Vitamins (VITAMIN B COMPLEX PO) Take 1 tablet by mouth daily. Unknown strength   Yes [provider]  busPIRone (BUSPAR) 10 MG tablet Take 1 tablet (10 mg total) by mouth 2 (two) times daily. 02/10/23  Yes Anders Simmonds, PA-C  ferrous sulfate 325 (65 FE) MG tablet Take 1 tablet (325 mg total) by mouth daily with breakfast. 02/01/23  Yes Ghimire, Werner Lean, MD  Fluticasone-Umeclidin-Vilant (TRELEGY ELLIPTA) 100-62.5-25 MCG/ACT AEPB Inhale 1 puff into the lungs daily. 02/01/23  Yes Ghimire, Werner Lean, MD  gabapentin (NEURONTIN) 100 MG capsule Take 1 capsule (100 mg total) by mouth 3 (three) times daily. 02/01/23  Yes Ghimire, Werner Lean, MD  hydrOXYzine (ATARAX) 25 MG tablet Take 1 tablet (25 mg total) by mouth 3 (three) times daily as needed for anxiety or itching. 02/01/23  Yes Ghimire, Werner Lean, MD  multivitamin (RENA-VIT) TABS tablet Take 1 tablet by mouth at bedtime. 02/01/23  Yes Ghimire, Werner Lean, MD  pantoprazole (PROTONIX) 40 MG tablet Take 1 tablet (40mg ) by mouth daily 02/01/23  Yes Ghimire, Werner Lean, MD  sodium bicarbonate 650 MG tablet Take 1 tablet (650 mg total) by mouth 3 (three) times daily. 02/01/23  Yes Ghimire, Werner Lean, MD  torsemide (DEMADEX) 100 MG tablet Take 1 tablet (100 mg total) by mouth daily. 02/01/23  Yes Ghimire, Werner Lean, MD  traZODone (DESYREL) 50 MG tablet Take 1 tablet (50 mg total) by mouth at bedtime as needed for sleep. 02/01/23  Yes Ghimire, Werner Lean, MD  ipratropium-albuterol (DUONEB) 0.5-2.5 (3) MG/3ML SOLN Take 3 mLs by nebulization every 6 (six) hours as needed (shortness of breath and wheezing). Patient not taking: Reported on 04/15/2023 02/01/23   Maretta Bees, MD    Allergies:   Allergies  Allergen Reactions   Zestril [Lisinopril] Other (See Comments)     Hyperkalemia    Social History:  reports that she has been smoking cigarettes. She has been exposed to tobacco smoke. She has never used smokeless tobacco. She reports that she does not currently use alcohol. She reports current drug use. Drug: Marijuana.  Family History: Family History  Problem Relation Age of Onset   Diabetes Father    Colon cancer Neg Hx    Esophageal cancer Neg Hx    Rectal cancer Neg Hx    Stomach cancer Neg Hx     Physical Exam: Vitals:   04/15/23 2002 04/15/23 2230 04/15/23 2345 04/15/23 2348  BP:  (!) 194/73 (!) 166/62   Pulse:  (!) 102 89   Resp:  20 (!) 21   Temp:    100.1 F (37.8 C)  TempSrc:    Oral  SpO2: 100% 99% 99%   Weight:        General:  A&Ox3, unkempt female, in acute distress [from generalized body pain] Eyes: Pink conjunctiva, no scleral icterus ENT: Moist oral mucosa, neck supple, no thyromegaly Lungs: CTA B/L, no wheeze, no crackles, no use of accessory muscles Cardiovascular: RRR, , no murmurs. No carotid bruits, no JVD Abdomen: soft, positive BS, NTND, not an acute abdomen GU: not examined Neuro: CN II - XII grossly intact,  Musculoskeletal: Right inner arm over AV fistula exquisitely tender, erythematous, warmth.  Bruits felt Skin: no rash, no subcutaneous crepitation, no decubitus Psych: Uncomfortable patient  Labs on Admission:  Recent Labs    04/15/23 1616  NA 135  K 4.3  CL 108  CO2 10*  GLUCOSE 111*  BUN 82*  CREATININE 5.82*  CALCIUM 9.1    Recent Labs    04/15/23 1616  WBC 7.8  NEUTROABS 6.6  HGB 6.6*  HCT 19.7*  MCV 100.5*  PLT 293   Recent Labs    04/15/23 1616  CKTOTAL 72    Micro Results: Recent Results (from the past 240 hours)  Resp panel by RT-PCR (RSV, Flu A&B, Covid) Anterior Nasal Swab     Status: None   Collection Time: 04/15/23  3:56 PM   Specimen: Anterior Nasal Swab  Result Value Ref Range Status   SARS Coronavirus 2 by RT PCR NEGATIVE NEGATIVE Final   Influenza A by PCR  NEGATIVE NEGATIVE Final   Influenza B by PCR NEGATIVE NEGATIVE Final    Comment: (NOTE) The Xpert Xpress SARS-CoV-2/FLU/RSV plus assay is intended as an aid in the diagnosis of influenza from Nasopharyngeal swab specimens and should not be used as a sole basis for treatment. Nasal washings and aspirates are unacceptable for Xpert Xpress SARS-CoV-2/FLU/RSV testing.  Fact Sheet for Patients: BloggerCourse.com  Fact Sheet for Healthcare Providers: SeriousBroker.it  This test is not yet approved or cleared by the [place] and has been authorized for detection and/or diagnosis of SARS-CoV-2 by FDA under an Emergency Use Authorization (EUA). This EUA will remain in effect (meaning this test can be used) for the duration of the COVID-19 declaration under Section 564(b)(1) of the Act, 21 U.S.C. section 360bbb-3(b)(1), unless the authorization is terminated or revoked.     Resp Syncytial Virus by PCR NEGATIVE NEGATIVE Final    Comment: (NOTE) Fact Sheet for Patients: BloggerCourse.com  Fact Sheet for Healthcare Providers: SeriousBroker.it  This test is not yet approved or cleared by the [place] FDA and has been authorized for detection and/or diagnosis of SARS-CoV-2 by FDA under an Emergency Use Authorization (EUA). This EUA will remain in effect (meaning this test can be used) for the duration of the COVID-19 declaration under Section 564(b)(1) of the Act, 21 U.S.C. section 360bbb-3(b)(1), unless the authorization is terminated or revoked.  Performed at [facility], [address], [place], [place] [address]      Radiological Exams on Admission: MR Lumbar Spine W Wo Contrast Result Date: 04/16/2023 CLINICAL DATA:  concern for lumbar infection EXAM: MRI LUMBAR SPINE WITHOUT AND WITH CONTRAST TECHNIQUE: Multiplanar and multiecho pulse sequences of the lumbar  spine were obtained without and with intravenous contrast. CONTRAST:  5mL GADAVIST GADOBUTROL 1 MMOL/ML IV SOLN COMPARISON:  None Available. FINDINGS: Segmentation:  Standard. Alignment:  Normal. Vertebrae: Mild degenerative/discogenic endplate signal changes at L4-L5 and L5-S1. Otherwise, no marrow edema to suggest discitis/osteomyelitis or acute fracture. No suspicious bone lesions. No abnormal enhancement. Conus medullaris and cauda equina: Conus extends to the T12-L1 level. Conus and cauda equina appear normal. No visible canal fluid collection. Paraspinal and other soft tissues: Small left kidney. Right kidney is not visible and may be absent or atrophic. Disc levels: L1-L2: No significant disc protrusion, foraminal stenosis, or canal stenosis. L2-L3: Disc bulging and left foraminal disc protrusion. Mild canal stenosis. No significant foraminal stenosis. L3-L4: Small left foraminal disc protrusion. Resulting mild left foraminal stenosis. Patent canal and right foramen. L4-L5: Disc height loss and desiccation. Disc bulging. Mild canal stenosis. Resulting moderate right foraminal stenosis. Patent left foramen. L5-S1: Disc height loss and desiccation. Disc bulging. Central disc protrusion. Bilateral facet arthropathy. Mild bilateral foraminal stenosis. Bilateral facet arthropathy. IMPRESSION: 1. No specific evidence of discitis/osteomyelitis or other acute abnormality. 2. Mild canal stenosis at L2-L3 and L4-L5. 3. Moderate right foraminal stenosis at L4-L5. 4. Mild foraminal stenosis on the left at L3-L4 and bilaterally at L5-S1. Electronically Signed   By: Feliberto Harts M.D.   On: 04/16/2023 00:09   DG Chest 2 View Result Date: 04/15/2023 CLINICAL DATA:  Sepsis.  Bilateral leg pain. EXAM: CHEST - 2 VIEW COMPARISON:  02/10/2023 FINDINGS: Heart size and mediastinal contours appear normal. Small right pleural effusion is improved when compared with the previous exam. No signs of interstitial edema or airspace  consolidation. Bones appear osteopenic and there is mild multilevel degenerative disc disease. IMPRESSION: Small right pleural effusion, improved when compared with the previous exam. No signs of pneumonia. Electronically Signed   By: Signa Kell M.D.   On: 04/15/2023 19:43    Assessment/Plan Present on Admission:  Suspected right AV fistula infection -Blood cultures x 2 collected -IV vancomycin and Zosyn ordered pharmacy to dose -X-ray ordered -Vascular surgery on call Dr. Randie Heinz consulted and aware -Pain meds as needed   Acute on chronic anemia -Anemia panel ordered.  Guaiac  negative -Anemia secondary to CKD.  Patient also with history of angiectasia and gastritis.  Extensive workup in past including enteroscopy and upper GI. -Transfusion ordered 1 unit PRBC -Posttransfusion H&H   Body pain -May be due to uremia.  Patient does not complain of pruritus. -Will continue Atarax   ESRD -Nephrology Dr Ronalee Belts aware -Resume sodium bicarb   Tobacco dependence -Nicotine patch   Essential hypertension -Start Norvasc 10 mg daily   Hepatitis C  Anxiety and depression   Chronic obstructive pulmonary disease (HCC)  Chronic pain syndrome  Hyperlipidemia -Patient noncompliant with home meds  Legrand Lasser 04/16/2023, 1:46 AM

## 2023-04-16 NOTE — ED Notes (Signed)
Holding on morning labs due to blood administration.

## 2023-04-16 NOTE — Progress Notes (Signed)
Pharmacy Antibiotic Note  [name] is a 63 y.o. female admitted on 04/15/2023 with concern for AV fistula infection. PMH significant for HD on Pharmacy has been consulted for zosyn and vancomycin dosing.  Plan: Zosyn 2.25 q8h  Vancomycin doses per levels F/u HD schedule, infectious work up and length of therapy   Weight: 49.9 kg (110 lb)  Temp (24hrs), Avg:100.1 F (37.8 C), Min:99.1 F (37.3 C), Max:100.9 F (38.3 C)  Recent Labs  Lab 04/15/23 1616 04/15/23 1850 04/15/23 2023  WBC 7.8  --   --   CREATININE 5.82*  --   --   LATICACIDVEN  --  0.3* 0.4*    Estimated Creatinine Clearance: 7.9 mL/min (A) (by C-G formula based on SCr of 5.82 mg/dL (H)).    Allergies  Allergen Reactions   Zestril [Lisinopril] Other (See Comments)    Hyperkalemia    Antimicrobials this admission: Zosyn 1/29 > Vancomycin 1/29 >  Microbiology results: 1/29 Ucx: IP  1/28 Bcx: IP  1/28 resp panel: negative  Thank you for allowing pharmacy to be a part of this patient's care.  Marja Kays 04/16/2023 2:49 AM

## 2023-04-16 NOTE — Progress Notes (Signed)
Dialysis access duplex has been completed.   Results can be found under chart review under CV PROC. 04/16/2023 3:55 PM Teandra Harlan RVT, RDMS

## 2023-04-16 NOTE — Progress Notes (Signed)
PROGRESS NOTE  [name] Early ZOX:[id number] DOB: [birth year]   PCP: Cynthia Matar, MD  Patient is from: Home.  She states she lives with friend.  DOA: 04/15/2023 LOS: 0  Chief complaints Chief Complaint  Patient presents with   Multiple Complaints     Brief Narrative / Interim history: 63 year old F with PMH of ESRD not on HD for 2 months, COPD, CHF, HTN, HLD, hep C, anemia, anxiety, polysubstance use and noncompliance presenting with myalgia, generalized weakness and subjective fever, and admitted due to concern for right AV fistula infection, anemia and elevated troponin.  In ED, febrile to 100.9.  Slightly tachycardic and tachypneic.  BP elevated.  WBC 7.8.  Hgb 6.6 (baseline 7-8). Cr 5.82.  BUN 82.  Bicarb 10.  AG 17.  CK normal.  Lactic acid negative.  Anemia panel with low iron saturation.  Troponin 131=>137>135.  EKG NSR.  UA with moderate LE, > 300 protein and rare bacteria.  UDS, urine culture and blood culture ordered.  Patient was started on broad-spectrum antibiotics.  Nephrology and vascular surgery consulted.   UDS positive for amphetamine and cocaine.  Blood cultures NGTD.  Evaluated by vascular surgery.  Duplex ordered for AV fistula.  Remains on broad-spectrum antibiotics.  Subjective: Seen and examined earlier this morning.  No major events overnight of this morning.  Continues to endorse myalgia/generalized body pain.  Also feels increased warmth and redness at the fistula site.  Denies chest pain, shortness of breath, nausea or vomiting.  Reports smoking about a pack a day.  Denied alcohol and recreational drug use.  However, when she was confronted with positive UDS, he admitted to snorting cocaine.  She denies IVDA.   Objective: Vitals:   04/16/23 0415 04/16/23 0558 04/16/23 0645 04/16/23 0959  BP: (!) 170/62 (!) 169/79 (!) 158/63 124/61  Pulse: 91 92 86 76  Resp: 14 20 15 17   Temp: (!) 100.9 F (38.3 C) (!) 101 F (38.3 C)  98.8 F (37.1 C)  TempSrc:   Oral  Oral  SpO2: 100%  100% 99%  Weight:        Examination:  GENERAL: No apparent distress.  Nontoxic. HEENT: MMM.  Vision and hearing grossly intact.  NECK: Supple.  No apparent JVD.  RESP:  No IWOB.  Fair aeration bilaterally. CVS:  RRR. Heart sounds normal.  Right arm AVF with bruits.  Slightly increased redness and warmth. ABD/GI/GU: BS+. Abd soft, NTND.  MSK/EXT:  Moves extremities. No apparent deformity. No edema.  Fistula site as above. SKIN: Fistula site as above. NEURO: Awake, alert and oriented appropriately.  No apparent focal neuro deficit. PSYCH: Calm. Normal affect.   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  Has no leukocytosis.  Some redness and increased warmth to touch at AV fistula site but no frank cellulitis.  UDS positive for cocaine and amphetamine.  Patient denies IV drug use.  Blood cultures NGTD -Continue vancomycin and Zosyn -Appreciate input by vascular-duplex ordered -Counseled on the importance of refraining from drug use. -Follow urine culture   Acute on chronic anemia: Baseline Hgb 7-8.  No overt bleeding.  Hemoccult negative.  Anemia panel with low iron saturation.  Not on anticoagulation or antiplatelet. Recent Labs    01/26/23 0317 01/27/23 0434 01/28/23 0412 01/29/23 0239 01/31/23 0758 01/31/23 1120 02/01/23 0845 02/10/23  1344 04/15/23 1616 04/16/23 0953  HGB 8.0* 7.8* 8.4* 7.9* 8.2* 9.1* 7.6* 7.6* 6.6* 6.6*  -Transfused 1 units -Check posttransfusion H&H -Continue monitoring   Myalgia/chronic pain syndrome: Myalgia likely due to substance use.  UDS positive for cocaine and amphetamine.  CK within normal.  COVID-19, influenza and RSV PCR nonreactive -Tylenol as needed -Rule out bacteremia   ESRD/CKD-5: Reports making  urine.  Last HD in November.  Followed by Dr. Ronalee [name].  No indication for emergent dialysis. -Nephrology consult  Anion gap metabolic acidosis: Likely due to azotemia -Continue sodium bicarbonate -Monitor   Polysubstance use: Patient denied recreational drug use until confronted by UDS result which is positive for cocaine and amphetamine.  She admitted to snorting cocaine but denies IVDU. -Advised to refrain from recreational drugs -Encouraged smoking cessation -Nicotine patch  Chronic COPD: Stable -Nebulizer/inhaler as needed -Encourage smoking cessation   Essential hypertension: BP improved. -Start Norvasc 10 mg daily   Hepatitis C -Outpatient follow-up  Anxiety and depression: Stable -Resume home meds after med rec  Underweight Body mass index is 17.23 kg/m. -Consult dietitian   DVT prophylaxis:  heparin injection 5,000 Units Start: 04/16/23 0600  Code Status: Full code Family Communication: None at bedside Level of care: Med-Surg Status is: Inpatient Remains inpatient appropriate because: Concern for AV fistula infection, ESRD/CKD   Final disposition: Likely home Consultants:  Nephrology Vascular surgery  55 minutes with more than 50% spent in reviewing records, counseling patient/family and coordinating care.   Sch Meds:  Scheduled Meds:  sodium chloride   Intravenous Once   ferrous sulfate  325 mg Oral Q breakfast   heparin  5,000 Units Subcutaneous Q8H   nicotine  14 mg Transdermal Daily   sodium bicarbonate  650 mg Oral TID   vancomycin variable dose per unstable renal function (pharmacist dosing)   Does not apply See admin instructions   Continuous Infusions:  piperacillin-tazobactam (ZOSYN)  IV     PRN Meds:.acetaminophen **OR** acetaminophen, albuterol, HYDROmorphone (DILAUDID) injection, hydrOXYzine, ondansetron **OR** ondansetron (ZOFRAN) IV, senna-docusate  Antimicrobials: Anti-infectives (From admission, onward)    Start     Dose/Rate  Route Frequency Ordered Stop   04/16/23 1400  piperacillin-tazobactam (ZOSYN) IVPB 2.25 g        2.25 g 100 mL/hr over 30 Minutes Intravenous Every 8 hours 04/16/23 0531     04/16/23 0535  vancomycin variable dose per unstable renal function (pharmacist dosing)         Does not apply See admin instructions 04/16/23 0535     04/16/23 0300  vancomycin (VANCOCIN) IVPB 1000 mg/200 mL premix        1,000 mg 200 mL/hr over 60 Minutes Intravenous  Once 04/16/23 0249 04/16/23 0735   04/16/23 0300  piperacillin-tazobactam (ZOSYN) IVPB 3.375 g        3.375 g 100 mL/hr over 30 Minutes Intravenous  Once 04/16/23 0249 04/16/23 0604   04/15/23 2115  cefTRIAXone (ROCEPHIN) 2 g in sodium chloride 0.9 % 100 mL IVPB        2 g 200 mL/hr over 30 Minutes Intravenous  Once 04/15/23 2102 04/15/23 2333        I have personally reviewed the following labs and images: CBC: Recent Labs  Lab 04/15/23 1616 04/16/23 0953  WBC 7.8 4.8  NEUTROABS 6.6 PENDING  HGB 6.6* 6.6*  HCT 19.7* 19.8*  MCV 100.5* 99.5  PLT 293 211   BMP &GFR Recent Labs  Lab 04/15/23 1616  NA 135  K  4.3  CL 108  CO2 10*  GLUCOSE 111*  BUN 82*  CREATININE 5.82*  CALCIUM 9.1   Estimated Creatinine Clearance: 7.9 mL/min (A) (by C-G formula based on SCr of 5.82 mg/dL (H)). Liver & Pancreas: No results for input(s): "AST", "ALT", "ALKPHOS", "BILITOT", "PROT", "ALBUMIN" in the last 168 hours. No results for input(s): "LIPASE", "AMYLASE" in the last 168 hours. No results for input(s): "AMMONIA" in the last 168 hours. Diabetic: No results for input(s): "HGBA1C" in the last 72 hours. No results for input(s): "GLUCAP" in the last 168 hours. Cardiac Enzymes: Recent Labs  Lab 04/15/23 1616  CKTOTAL 72   No results for input(s): "PROBNP" in the last 8760 hours. Coagulation Profile: Recent Labs  Lab 04/16/23 0952  INR 1.2   Thyroid Function Tests: No results for input(s): "TSH", "T4TOTAL", "FREET4", "T3FREE", "THYROIDAB"  in the last 72 hours. Lipid Profile: No results for input(s): "CHOL", "HDL", "LDLCALC", "TRIG", "CHOLHDL", "LDLDIRECT" in the last 72 hours. Anemia Panel: Recent Labs    04/15/23 1900  VITAMINB12 387  FOLATE >40.0  FERRITIN 269  TIBC 297  IRON 24*  RETICCTPCT 2.9   Urine analysis:    Component Value Date/Time   COLORURINE YELLOW 04/16/2023 0200   APPEARANCEUR HAZY (A) 04/16/2023 0200   LABSPEC 1.009 04/16/2023 0200   PHURINE 6.0 04/16/2023 0200   GLUCOSEU 50 (A) 04/16/2023 0200   HGBUR SMALL (A) 04/16/2023 0200   BILIRUBINUR NEGATIVE 04/16/2023 0200   KETONESUR NEGATIVE 04/16/2023 0200   PROTEINUR >=300 (A) 04/16/2023 0200   UROBILINOGEN 0.2 03/02/2010 2027   NITRITE NEGATIVE 04/16/2023 0200   LEUKOCYTESUR MODERATE (A) 04/16/2023 0200   Sepsis Labs: Invalid input(s): "PROCALCITONIN", "LACTICIDVEN"  Microbiology: Recent Results (from the past 240 hours)  Resp panel by RT-PCR (RSV, Flu A&B, Covid) Anterior Nasal Swab     Status: None   Collection Time: 04/15/23  3:56 PM   Specimen: Anterior Nasal Swab  Result Value Ref Range Status   SARS Coronavirus 2 by RT PCR NEGATIVE NEGATIVE Final   Influenza A by PCR NEGATIVE NEGATIVE Final   Influenza B by PCR NEGATIVE NEGATIVE Final    Comment: (NOTE) The Xpert Xpress SARS-CoV-2/FLU/RSV plus assay is intended as an aid in the diagnosis of influenza from Nasopharyngeal swab specimens and should not be used as a sole basis for treatment. Nasal washings and aspirates are unacceptable for Xpert Xpress SARS-CoV-2/FLU/RSV testing.  Fact Sheet for Patients: BloggerCourse.com  Fact Sheet for Healthcare Providers: SeriousBroker.it  This test is not yet approved or cleared by the [place] FDA and has been authorized for detection and/or diagnosis of SARS-CoV-2 by FDA under an Emergency Use Authorization (EUA). This EUA will remain in effect (meaning this test can be used) for  the duration of the COVID-19 declaration under Section 564(b)(1) of the Act, 21 U.S.C. section 360bbb-3(b)(1), unless the authorization is terminated or revoked.     Resp Syncytial Virus by PCR NEGATIVE NEGATIVE Final    Comment: (NOTE) Fact Sheet for Patients: BloggerCourse.com  Fact Sheet for Healthcare Providers: SeriousBroker.it  This test is not yet approved or cleared by the [place] FDA and has been authorized for detection and/or diagnosis of SARS-CoV-2 by FDA under an Emergency Use Authorization (EUA). This EUA will remain in effect (meaning this test can be used) for the duration of the COVID-19 declaration under Section 564(b)(1) of the Act, 21 U.S.C. section 360bbb-3(b)(1), unless the authorization is terminated or revoked.  Performed at [facility], [address] [place], [place] [address]   Blood culture (routine x 2)     Status: None (Preliminary result)   Collection Time: 04/15/23  6:58 PM   Specimen: BLOOD LEFT ARM  Result Value Ref Range Status   Specimen Description BLOOD LEFT ARM  Final   Special Requests   Final    BOTTLES DRAWN AEROBIC AND ANAEROBIC Blood Culture results may not be optimal due to an inadequate volume of blood received in culture bottles   Culture   Final    NO GROWTH < 12 HOURS Performed at [facility], [address], [place], [place] [address]    Report Status PENDING  Incomplete  Blood culture (routine x 2)     Status: None (Preliminary result)   Collection Time: 04/15/23  6:58 PM   Specimen: BLOOD LEFT ARM  Result Value Ref Range Status   Specimen Description BLOOD LEFT ARM  Final   Special Requests   Final    AEROBIC BOTTLE ONLY Blood Culture results may not be optimal due to an inadequate volume of blood received in culture bottles   Culture   Final    NO GROWTH < 12 HOURS Performed at [facility], [address], [place], [place] [address]     Report Status PENDING  Incomplete    Radiology Studies: DG Forearm Right Result Date: 04/16/2023 CLINICAL DATA:  Pain and swelling in the right elbow, initial encounter EXAM: RIGHT FOREARM - 2 VIEW COMPARISON:  None Available. FINDINGS: No acute fracture or dislocation is noted. No soft tissue abnormality is seen. Surgical clips are noted along the distal humerus. IMPRESSION: No acute abnormality noted Electronically Signed   By: Alcide Clever M.D.   On: 04/16/2023 03:12   MR Lumbar Spine W Wo Contrast Result Date: 04/16/2023 CLINICAL DATA:  concern for lumbar infection EXAM: MRI LUMBAR SPINE WITHOUT AND WITH CONTRAST TECHNIQUE: Multiplanar and multiecho pulse sequences of the lumbar spine were obtained without and with intravenous contrast. CONTRAST:  5mL GADAVIST GADOBUTROL 1 MMOL/ML IV SOLN COMPARISON:  None Available. FINDINGS: Segmentation:  Standard. Alignment:  Normal. Vertebrae: Mild degenerative/discogenic endplate signal changes at L4-L5 and L5-S1. Otherwise, no marrow edema to suggest discitis/osteomyelitis or acute fracture. No suspicious bone lesions. No abnormal enhancement. Conus medullaris and cauda equina: Conus extends to the T12-L1 level. Conus and cauda equina appear normal. No visible canal fluid collection. Paraspinal and other soft tissues: Small left kidney. Right kidney is not visible and may be absent or atrophic. Disc levels: L1-L2: No significant disc protrusion, foraminal stenosis, or canal stenosis. L2-L3: Disc bulging and left foraminal disc protrusion. Mild canal stenosis. No significant foraminal stenosis. L3-L4: Small left foraminal disc protrusion. Resulting mild left foraminal stenosis. Patent canal and right foramen. L4-L5: Disc height loss and desiccation. Disc bulging. Mild canal stenosis. Resulting moderate right foraminal stenosis. Patent left foramen. L5-S1: Disc height loss and desiccation. Disc bulging. Central disc protrusion. Bilateral facet arthropathy. Mild  bilateral foraminal stenosis. Bilateral facet arthropathy. IMPRESSION: 1. No specific evidence of discitis/osteomyelitis or other acute abnormality. 2. Mild canal stenosis at L2-L3 and L4-L5. 3. Moderate right foraminal stenosis at L4-L5. 4. Mild foraminal stenosis on the left at L3-L4 and bilaterally at L5-S1. Electronically Signed   By: Feliberto Harts M.D.   On: 04/16/2023 00:09   DG Chest 2 View Result Date: 04/15/2023 CLINICAL DATA:  Sepsis.  Bilateral leg pain. EXAM: CHEST - 2 VIEW COMPARISON:  02/10/2023 FINDINGS: Heart size and mediastinal  contours appear normal. Small right pleural effusion is improved when compared with the previous exam. No signs of interstitial edema or airspace consolidation. Bones appear osteopenic and there is mild multilevel degenerative disc disease. IMPRESSION: Small right pleural effusion, improved when compared with the previous exam. No signs of pneumonia. Electronically Signed   By: Signa Kell M.D.   On: 04/15/2023 19:43      Alexei Doswell T. Gab[facility]  If 7PM-7AM, please contact night-coverage [email or web address] 04/16/2023, 10:43 AM
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
## 2023-04-17 ENCOUNTER — Other Ambulatory Visit: Payer: Self-pay

## 2023-04-17 DIAGNOSIS — N186 End stage renal disease: Secondary | ICD-10-CM | POA: Diagnosis not present

## 2023-04-17 DIAGNOSIS — R0602 Shortness of breath: Secondary | ICD-10-CM | POA: Diagnosis not present

## 2023-04-17 DIAGNOSIS — T827XXA Infection and inflammatory reaction due to other cardiac and vascular devices, implants and grafts, initial encounter: Secondary | ICD-10-CM | POA: Diagnosis not present

## 2023-04-17 DIAGNOSIS — D649 Anemia, unspecified: Secondary | ICD-10-CM | POA: Diagnosis not present

## 2023-04-17 LAB — CBC
HCT: 18.4 % — ABNORMAL LOW (ref 36.0–46.0)
Hemoglobin: 6.6 g/dL — CL (ref 12.0–15.0)
MCH: 34.2 pg — ABNORMAL HIGH (ref 26.0–34.0)
MCHC: 35.9 g/dL (ref 30.0–36.0)
MCV: 95.3 fL (ref 80.0–100.0)
Platelets: 203 10*3/uL (ref 150–400)
RBC: 1.93 MIL/uL — ABNORMAL LOW (ref 3.87–5.11)
RDW: 15 % (ref 11.5–15.5)
WBC: 3.5 10*3/uL — ABNORMAL LOW (ref 4.0–10.5)
nRBC: 0 % (ref 0.0–0.2)

## 2023-04-17 LAB — RENAL FUNCTION PANEL
Albumin: 2.5 g/dL — ABNORMAL LOW (ref 3.5–5.0)
Anion gap: 10 (ref 5–15)
BUN: 83 mg/dL — ABNORMAL HIGH (ref 8–23)
CO2: 11 mmol/L — ABNORMAL LOW (ref 22–32)
Calcium: 7.7 mg/dL — ABNORMAL LOW (ref 8.9–10.3)
Chloride: 109 mmol/L (ref 98–111)
Creatinine, Ser: 6.28 mg/dL — ABNORMAL HIGH (ref 0.44–1.00)
GFR, Estimated: 7 mL/min — ABNORMAL LOW (ref >60)
Glucose, Bld: 103 mg/dL — ABNORMAL HIGH (ref 70–99)
Phosphorus: 6.1 mg/dL — ABNORMAL HIGH (ref 2.5–4.6)
Potassium: 3.2 mmol/L — ABNORMAL LOW (ref 3.5–5.1)
Sodium: 130 mmol/L — ABNORMAL LOW (ref 135–145)

## 2023-04-17 LAB — HEMOGLOBIN AND HEMATOCRIT, BLOOD
HCT: 21.3 % — ABNORMAL LOW (ref 36.0–46.0)
Hemoglobin: 7.6 g/dL — ABNORMAL LOW (ref 12.0–15.0)

## 2023-04-17 LAB — MAGNESIUM: Magnesium: 1.8 mg/dL (ref 1.7–2.4)

## 2023-04-17 LAB — URINE CULTURE

## 2023-04-17 LAB — PREPARE RBC (CROSSMATCH)

## 2023-04-17 MED ORDER — LIDOCAINE 5 % EX PTCH
3.0000 | MEDICATED_PATCH | CUTANEOUS | Status: DC
  Administered 2023-04-17 – 2023-04-21 (×5): 3 via TRANSDERMAL
  Filled 2023-04-17 (×5): qty 3

## 2023-04-17 MED ORDER — CALCIUM ACETATE (PHOS BINDER) 667 MG PO CAPS
1334.0000 mg | ORAL_CAPSULE | Freq: Three times a day (TID) | ORAL | Status: DC
  Administered 2023-04-17 – 2023-04-21 (×12): 1334 mg via ORAL
  Filled 2023-04-17 (×13): qty 2

## 2023-04-17 MED ORDER — POTASSIUM CHLORIDE CRYS ER 20 MEQ PO TBCR
40.0000 meq | EXTENDED_RELEASE_TABLET | Freq: Once | ORAL | Status: AC
  Administered 2023-04-17: 40 meq via ORAL
  Filled 2023-04-17: qty 2

## 2023-04-17 MED ORDER — TORSEMIDE 20 MG PO TABS
100.0000 mg | ORAL_TABLET | Freq: Every day | ORAL | Status: DC
  Administered 2023-04-17 – 2023-04-21 (×5): 100 mg via ORAL
  Filled 2023-04-17 (×5): qty 5

## 2023-04-17 MED ORDER — SODIUM BICARBONATE 650 MG PO TABS
1300.0000 mg | ORAL_TABLET | Freq: Three times a day (TID) | ORAL | Status: DC
Start: 2023-04-17 — End: 2023-04-21
  Administered 2023-04-17 – 2023-04-21 (×12): 1300 mg via ORAL
  Filled 2023-04-17 (×12): qty 2

## 2023-04-17 MED ORDER — SODIUM CHLORIDE 0.9% IV SOLUTION: Freq: Once | INTRAVENOUS | Status: AC

## 2023-04-17 NOTE — Progress Notes (Signed)
Patient ID: [name], female   DOB: [birth year], 63 y.o.   MRN: [id number] S: No new complaints.  Receiving blood today due to drop in Hgb.  O:BP (!) 140/66   Pulse 72   Temp 97.9 F (36.6 C) (Oral)   Resp 18   Wt 49.9 kg   SpO2 100%   BMI 17.23 kg/m   Intake/Output Summary (Last 24 hours) at 04/17/2023 1259 Last data filed at 04/17/2023 0526 Gross per 24 hour  Intake 570.67 ml  Output --  Net 570.67 ml   Intake/Output: I/O last 3 completed shifts: In: 966.5 [P.O.:360; I.V.:0.7; Blood:248; IV Piggyback:357.9] Out: -   Intake/Output this shift:  No intake/output data recorded. Weight change:  Gen: NAD CVS: RRR Resp:CTA Abd: +BS, soft, NT/ND Ext: +erythema and induration of right antecubital area, RUE AVF +T/B  Recent Labs  Lab 04/15/23 1616 04/16/23 0952 04/17/23 0454  NA 135 130* 130*  K 4.3 3.3* 3.2*  CL 108 110 109  CO2 10* 10* 11*  GLUCOSE 111* 99 103*  BUN 82* 75* 83*  CREATININE 5.82* 5.82* 6.28*  ALBUMIN  --  2.7* 2.5*  CALCIUM 9.1 8.0* 7.7*  PHOS  --  5.4* 6.1*  AST  --  17  --   ALT  --  18  --    Liver Function Tests: Recent Labs  Lab 04/16/23 0952 04/17/23 0454  AST 17  --   ALT 18  --   ALKPHOS 98  --   BILITOT 0.5  --   PROT 5.9*  --   ALBUMIN 2.7* 2.5*   No results for input(s): "LIPASE", "AMYLASE" in the last 168 hours. No results for input(s): "AMMONIA" in the last 168 hours. CBC: Recent Labs  Lab 04/15/23 1616 04/16/23 0953 04/16/23 1433 04/17/23 0454  WBC 7.8 4.8 5.2 3.5*  NEUTROABS 6.6 3.5  --   --   HGB 6.6* 6.6* 7.0* 6.6*  HCT 19.7* 19.8* 21.0* 18.4*  MCV 100.5* 99.5 100.0 95.3  PLT 293 211 211 203   Cardiac Enzymes: Recent Labs  Lab 04/15/23 1616  CKTOTAL 72   CBG: No results for input(s): "GLUCAP" in the last 168 hours.  Iron Studies:  Recent Labs    04/15/23 1900  IRON 24*  TIBC 297  FERRITIN 269   Studies/Results: VAS US DUPLEX DIALYSIS ACCESS (AVF, AVG) Result Date: 04/16/2023 DIALYSIS ACCESS  Patient Name:  KENNEDEY DIGILIO Wade  Date of Exam:   04/16/2023 Medical Rec #: [id number]       Accession #:    [id number] Date of Birth: [birth year]       Patient Gender: F Patient Age:   42 years Exam Location:  [facility] Procedure:      VAS US DUPLEX DIALYSIS ACCESS (AVF, AVG) Referring Phys: Lemar Livings --------------------------------------------------------------------------------  Reason for Exam: Mechanical complication of AVF. Access Site: Right Upper Extremity. Access Type: Brachial-basilic AVF. Performing Technologist: Ernestene Mention RVT, RDMS  Examination Guidelines: A complete evaluation includes B-mode imaging, spectral Doppler, color Doppler, and power Doppler as needed of all accessible portions of each vessel. Unilateral testing is considered an integral part of a complete examination. Limited examinations for reoccurring indications may be performed as noted.  Findings: +--------------------+----------+-----------------+--------+ AVF                 PSV (cm/s)Flow Vol (mL/min)Comments +--------------------+----------+-----------------+--------+ Native artery inflow   209           561                +--------------------+----------+-----------------+--------+  AVF Anastomosis        289                              +--------------------+----------+-----------------+--------+  +------------+----------+-------------+----------+--------+ OUTFLOW VEINPSV (cm/s)Diameter (cm)Depth (cm)Describe +------------+----------+-------------+----------+--------+ Prox UA        268                                    +------------+----------+-------------+----------+--------+ Mid UA         139                                    +------------+----------+-------------+----------+--------+ Dist UA        319                                    +------------+----------+-------------+----------+--------+ AC Fossa       265                                     +------------+----------+-------------+----------+--------+   Summary: Patent AVF with volume flow <600 cm/s.  *See table(s) above for measurements and observations.  Diagnosing physician: Heath Lark Electronically signed by Heath Lark on 04/16/2023 at 4:17:01 PM.    --------------------------------------------------------------------------------   Final    DG Forearm Right Result Date: 04/16/2023 CLINICAL DATA:  Pain and swelling in the right elbow, initial encounter EXAM: RIGHT FOREARM - 2 VIEW COMPARISON:  None Available. FINDINGS: No acute fracture or dislocation is noted. No soft tissue abnormality is seen. Surgical clips are noted along the distal humerus. IMPRESSION: No acute abnormality noted Electronically Signed   By: Alcide Clever M.D.   On: 04/16/2023 03:12   MR Lumbar Spine W Wo Contrast Result Date: 04/16/2023 CLINICAL DATA:  concern for lumbar infection EXAM: MRI LUMBAR SPINE WITHOUT AND WITH CONTRAST TECHNIQUE: Multiplanar and multiecho pulse sequences of the lumbar spine were obtained without and with intravenous contrast. CONTRAST:  5mL GADAVIST GADOBUTROL 1 MMOL/ML IV SOLN COMPARISON:  None Available. FINDINGS: Segmentation:  Standard. Alignment:  Normal. Vertebrae: Mild degenerative/discogenic endplate signal changes at L4-L5 and L5-S1. Otherwise, no marrow edema to suggest discitis/osteomyelitis or acute fracture. No suspicious bone lesions. No abnormal enhancement. Conus medullaris and cauda equina: Conus extends to the T12-L1 level. Conus and cauda equina appear normal. No visible canal fluid collection. Paraspinal and other soft tissues: Small left kidney. Right kidney is not visible and may be absent or atrophic. Disc levels: L1-L2: No significant disc protrusion, foraminal stenosis, or canal stenosis. L2-L3: Disc bulging and left foraminal disc protrusion. Mild canal stenosis. No significant foraminal stenosis. L3-L4: Small left foraminal disc protrusion. Resulting mild left  foraminal stenosis. Patent canal and right foramen. L4-L5: Disc height loss and desiccation. Disc bulging. Mild canal stenosis. Resulting moderate right foraminal stenosis. Patent left foramen. L5-S1: Disc height loss and desiccation. Disc bulging. Central disc protrusion. Bilateral facet arthropathy. Mild bilateral foraminal stenosis. Bilateral facet arthropathy. IMPRESSION: 1. No specific evidence of discitis/osteomyelitis or other acute abnormality. 2. Mild canal stenosis at L2-L3 and L4-L5. 3. Moderate right foraminal stenosis at L4-L5. 4. Mild foraminal stenosis on the left at L3-L4 and bilaterally at L5-S1. Electronically  Signed   By: Feliberto Harts M.D.   On: 04/16/2023 00:09   DG Chest 2 View Result Date: 04/15/2023 CLINICAL DATA:  Sepsis.  Bilateral leg pain. EXAM: CHEST - 2 VIEW COMPARISON:  02/10/2023 FINDINGS: Heart size and mediastinal contours appear normal. Small right pleural effusion is improved when compared with the previous exam. No signs of interstitial edema or airspace consolidation. Bones appear osteopenic and there is mild multilevel degenerative disc disease. IMPRESSION: Small right pleural effusion, improved when compared with the previous exam. No signs of pneumonia. Electronically Signed   By: Signa Kell M.D.   On: 04/15/2023 19:43    busPIRone  10 mg Oral BID   darbepoetin (ARANESP) injection - NON-DIALYSIS  60 mcg Subcutaneous Q Wed-1800   fluticasone furoate-vilanterol  1 puff Inhalation Daily   And   umeclidinium bromide  1 puff Inhalation Daily   gabapentin  100 mg Oral TID   heparin  5,000 Units Subcutaneous Q8H   lidocaine  3 patch Transdermal Q24H   multivitamin  1 tablet Oral QHS   nicotine  14 mg Transdermal Daily   pantoprazole  40 mg Oral Daily   sodium bicarbonate  650 mg Oral TID   vancomycin variable dose per unstable renal function (pharmacist dosing)   Does not apply See admin instructions    BMET    Component Value Date/Time   NA 130 (L)  04/17/2023 0454   NA 137 06/06/2022 1410   K 3.2 (L) 04/17/2023 0454   CL 109 04/17/2023 0454   CO2 11 (L) 04/17/2023 0454   GLUCOSE 103 (H) 04/17/2023 0454   BUN 83 (H) 04/17/2023 0454   BUN 37 (H) 06/06/2022 1410   CREATININE 6.28 (H) 04/17/2023 0454   CREATININE 1.64 (H) 04/29/2016 1047   CALCIUM 7.7 (L) 04/17/2023 0454   CALCIUM 7.9 (L) 08/15/2022 0104   GFRNONAA 7 (L) 04/17/2023 0454   GFRNONAA 35 (L) 04/29/2016 1047   GFRAA 36 (L) 08/31/2019 1230   GFRAA 40 (L) 04/29/2016 1047   CBC    Component Value Date/Time   WBC 3.5 (L) 04/17/2023 0454   RBC 1.93 (L) 04/17/2023 0454   HGB 6.6 (LL) 04/17/2023 0454   HGB 6.0 (LL) 06/06/2022 1410   HCT 18.4 (L) 04/17/2023 0454   HCT 18.1 (L) 06/06/2022 1410   PLT 203 04/17/2023 0454   PLT 353 06/06/2022 1410   MCV 95.3 04/17/2023 0454   MCV 96 06/06/2022 1410   MCH 34.2 (H) 04/17/2023 0454   MCHC 35.9 04/17/2023 0454   RDW 15.0 04/17/2023 0454   RDW 13.6 06/06/2022 1410   LYMPHSABS 1.2 04/16/2023 0953   LYMPHSABS 1.3 06/06/2022 1410   MONOABS 0.1 04/16/2023 0953   EOSABS 0.0 04/16/2023 0953   EOSABS 0.4 06/06/2022 1410   BASOSABS 0.0 04/16/2023 0953   BASOSABS 0.0 06/06/2022 1410    Assessment/Plan:  Cellulitis of RUE - seen by Dr. Randie Heinz and agree with IV antibiotics.  Currently on zosyn and vancomycin with improvement of erythema and induration.  Still remains painful to touch. Acute on chronic anemia of CKD stage V - will give IV iron and resume ESA and follow.  Transfuse for hgb <7. CKD stage V - no urgent indication to resume HD and would not want to use her AVF for now given pain and infection.  Would not want to place an HD catheter either given her noncompliance with HD.  Hopefully after a day or so of antibiotics we can use her  avf without issue of pain.  Need palliative care to discuss pt about not resuming HD given her multiple admissions due to noncompliance.  Will continue to follow for now.  Will need to restart slow  start dialysis since she has not had it in 3 months.  Avoid nephrotoxic medications including NSAIDs and iodinated intravenous contrast exposure unless the latter is absolutely indicated.   Preferred narcotic agents for pain control are hydromorphone, fentanyl, and methadone. Morphine should not be used.  Avoid Baclofen and avoid oral sodium phosphate and magnesium citrate based laxatives / bowel preps.  Continue strict Input and Output monitoring. Will monitor the patient closely with you and intervene or adjust therapy as indicated by changes in clinical status/labs   Myalgia/chronic pain - negative covid-19, flu, and RSV PCR.  Likely due to ongoing substance use. Hypokalmeia - replete gingerly given CKD stage V. Polysubstance abuse - initially denied use.  Admitted to snorting cocaine and amphetamine after discussing her UDS.  She denied any IV drug use or skin popping in her right arm. Homelessness - will need SW assistance prior to discharge. Hepatitis c - hep C ab + will order HCV RNA. Metabolic acidosis - will start oral sodium bicarb until we can use AVF for resumption of HD.  Irena Cords, MD [employer] [phone]

## 2023-04-17 NOTE — Evaluation (Addendum)
Occupational Therapy Evaluation Patient Details Name: [name] MRN: [id number] DOB: 12/05/1960 Today's Date: 04/17/2023   History of Present Illness 63 yo female presents to [facility] 04/16/23 with complete body pain in her legs, lower back, arm. Noncompliant with HD (last ~3 mos ago) with anemia (transfused 1 unit). Suspected R UE cellulitis and AV fistula infection with pt on vanc/zosyn. Dialysis access duplex performed 1/29, no mention of fluid collection, blood cx <12 hr without growth. UDS positive for amphetamine and cocaine. PMH includes ESRD with noncompliance, DMII, COPD, PAD, GERD, GIB, AVM, CHF, anemia, polysubstance abuse, HLD, hep C, tobacco use disorder and homelessness.   Clinical Impression   Pt c/o moderate pain to RUE, back, BLEs, generalized weakness, states she is feeling better than yesterday. Pt lives at home with friend and friends brother, flight of stairs to get to her home, PLOF independent. Pt currently close to baseline, set up/supervision for safety with ADLs/mobility. Pt worked with PT earlier, states she is still tired from that session, just finished eating, and would rather not walk around again. Pt able to complete bed mobility mod I, sit EOB as needed, complete dressing with set up. Pt would benefit from acute OT to maximize activity tolerance, no OT follow up expected.      If plan is discharge home, recommend the following: A little help with walking and/or transfers;A little help with bathing/dressing/bathroom;Assist for transportation;Help with stairs or ramp for entrance    Functional Status Assessment  Patient has had a recent decline in their functional status and demonstrates the ability to make significant improvements in function in a reasonable and predictable amount of time.  Equipment Recommendations  Other (comment) (TBD)    Recommendations for Other Services       Precautions / Restrictions Precautions Precautions: Fall Restrictions Weight  Bearing Restrictions Per Provider Order: No      Mobility Bed Mobility Overal bed mobility: Modified Independent                  Transfers                   General transfer comment: Pt refused      Balance Overall balance assessment: Needs assistance Sitting-balance support: Feet supported, No upper extremity supported Sitting balance-Leahy Scale: Good Sitting balance - Comments: sitting EOB                                   ADL either performed or assessed with clinical judgement   ADL Overall ADL's : Needs assistance/impaired                                       General ADL Comments: Pt close to baseline, supervision/set up for ADLs, weakness/fatigue main limiting factor     Vision Baseline Vision/History:  (Pt has blurry vision, never got it checked out) Ability to See in Adequate Light: 2 Moderately impaired Patient Visual Report: No change from baseline       Perception         Praxis         Pertinent Vitals/Pain Pain Assessment Pain Assessment: 0-10 Pain Score: 5  Pain Location: RUE, back, legs Pain Descriptors / Indicators: Aching, Discomfort Pain Intervention(s): Monitored during session     Extremity/Trunk Assessment Upper Extremity Assessment Upper Extremity Assessment:  Overall [facility] for tasks assessed   Lower Extremity Assessment Lower Extremity Assessment: Defer to PT evaluation   Cervical / Trunk Assessment Cervical / Trunk Assessment: Normal   Communication Communication Communication: No apparent difficulties Cueing Techniques: Verbal cues   Cognition Arousal: Alert Behavior During Therapy: Restless, Impulsive Overall Cognitive Status: Within Functional Limits for tasks assessed                                       General Comments  IV site bleeding, RN present and aware.    Exercises     Shoulder Instructions      Home Living Family/patient expects to be  discharged to:: Private residence Living Arrangements: Spouse/significant other;Non-relatives/Friends Available Help at Discharge: Family;Available 24 hours/day Type of Home: House Home Access: Stairs to enter [employer] of Steps: 12 Entrance Stairs-Rails: None Home Layout: One level     Bathroom Shower/Tub: Tub/shower unit         Home Equipment: None   Additional Comments: Pt lives with friend and friend's brother, available 24/7      Prior Functioning/Environment Prior Level of Function : Independent/Modified Independent             Mobility Comments: no AD use ADLs Comments: independent        OT Problem List: Decreased strength;Decreased activity tolerance;Impaired balance (sitting and/or standing);Pain      OT Treatment/Interventions: Self-care/ADL training;Therapeutic exercise;Energy conservation;DME and/or AE instruction;Patient/family education    OT Goals(Current goals can be found in the care plan section) Acute Rehab OT Goals Patient Stated Goal: to return home OT Goal Formulation: With patient Time For Goal Achievement: 05/01/23 Potential to Achieve Goals: Good  OT Frequency: Min 1X/week    Co-evaluation              AM-PAC OT "6 Clicks" Daily Activity     Outcome Measure Help from another person eating meals?: None Help from another person taking care of personal grooming?: A Little Help from another person toileting, which includes using toliet, bedpan, or urinal?: A Little Help from another person bathing (including washing, rinsing, drying)?: A Little Help from another person to put on and taking off regular upper body clothing?: A Little Help from another person to put on and taking off regular lower body clothing?: A Little 6 Click Score: 19   End of Session Nurse Communication: Mobility status  Activity Tolerance: Patient tolerated treatment well Patient left: in bed;with call bell/phone within reach;with nursing/sitter  in room  OT Visit Diagnosis: Unsteadiness on feet (R26.81);Other abnormalities of gait and mobility (R26.89);Muscle weakness (generalized) (M62.81);Pain Pain - Right/Left: Right Pain - part of body: Arm                Time: 1319-1335 OT Time Calculation (min): 16 min Charges:  OT General Charges $OT Visit: 1 Visit OT Evaluation $OT Eval Low Complexity: 1 Low  [address] OTR/L   Alexis Goodell 04/17/2023, 1:45 PM

## 2023-04-17 NOTE — TOC CAGE-AID Note (Addendum)
Transition of Care [facility]) - CAGE-AID Screening   Patient Details  Name: [name] MRN: [id number] Date of Birth: [birth year]  [facility] CM/SW Contact:    Epifanio Lesches, RN Phone Number: 04/17/2023, 2:10 PM   Clinical Narrative: Screening completed. Pt states had a small "hiccup"and relapsed. States had been drug free x 1 yr. UDS positive for cocaine and amphetamines. Pt states doesn't need help to stop. Declined Agricultural engineer.   CAGE-AID Screening:    Have You Ever Felt You Ought to Cut Down on Your Drinking or Drug Use?: No Have People Annoyed You By Critizing Your Drinking Or Drug Use?: No Have You Felt Bad Or Guilty About Your Drinking Or Drug Use?: Yes Have You Ever Had a Drink or Used Drugs First Thing In The Morning to Steady Your Nerves or to Get Rid of a Hangover?: No CAGE-AID Score: 1  Substance Abuse Education Offered:  (Pt declined.)
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
## 2023-04-17 NOTE — Evaluation (Addendum)
Physical Therapy Evaluation Patient Details Name: [name] MRN: [id number] DOB: 07-16-1960 Today's Date: 04/17/2023  History of Present Illness  63 yo female [facility] to [facility]16/23 with complete body pain in her legs, lower back, arm. Noncompliant with HD (last ~3 mos ago) with anemia (transfused 1 unit). Suspected R UE cellulitis and AV fistula infection with pt on vanc/zosyn. Dialysis access duplex performed 1/29, no mention of fluid collection, blood cx <12 hr without growth. UDS positive for amphetamine and cocaine. PMH includes ESRD with noncompliance, DMII, COPD, PAD, GERD, GIB, AVM, CHF, anemia, polysubstance abuse, HLD, hep C, tobacco use disorder and homelessness.   Clinical Impression  Pt in bed upon arrival and agreeable to PT eval. Prior to admit, pt reports being independent for mobility with no AD. Pt reports always being slightly unsteady, however, does not like to use an AD. In today's session, pt was supervision to stand and CGA to ambulate ~20 ft with no AD. Pt was mildly unsteady and would reach for UE support from the wall. Discussed using a RW for increased stability with the pt. Pt presents to therapy session with decreased balance, generalized weakness, decreased mobility and decreased activity tolerance. Pt would benefit from acute skilled PT to address functional impairments. Pt reports having 24/7 physical assistance available if needed upon return home. Recommending post-acute HHPT to work on independence with mobility. Acute PT to follow.          If plan is discharge home, recommend the following: A little help with walking and/or transfers;A little help with bathing/dressing/bathroom;Assist for transportation;Help with stairs or ramp for entrance   Can travel by private vehicle    Yes    Equipment Recommendations Other (comment) (TBD)     Functional Status Assessment Patient has had a recent decline in their functional status and demonstrates the ability to  make significant improvements in function in a reasonable and predictable amount of time.     Precautions / Restrictions Precautions Precautions: Fall Restrictions Weight Bearing Restrictions Per Provider Order: No      Mobility  Bed Mobility Overal bed mobility: Modified Independent     General bed mobility comments: HOB elevated    Transfers Overall transfer level: Needs assistance Equipment used: None Transfers: Sit to/from Stand Sit to Stand: Supervision  General transfer comment: supervision for safety, pt moves impulsively and needed cues for safety    Ambulation/Gait Ambulation/Gait assistance: Contact guard assist Gait Distance (Feet): 20 Feet Assistive device: None Gait Pattern/deviations: Step-through pattern, Drifts right/left, Trunk flexed, Narrow base of support, Shuffle Gait velocity: decr     General Gait Details: pt mildly unsteady with no AD, would reach for UE support from the wall     Balance Overall balance assessment: Needs assistance, Mild deficits observed, not formally tested Sitting-balance support: No upper extremity supported, Feet supported Sitting balance-Leahy Scale: Good     Standing balance support: No upper extremity supported Standing balance-Leahy Scale: Fair Standing balance comment: able to stand with no UE support, reaches for support with dynamic activities       Pertinent Vitals/Pain Pain Assessment Pain Assessment: Faces Faces Pain Scale: Hurts little more Pain Location: IV site Pain Descriptors / Indicators: Aching, Discomfort Pain Intervention(s): Limited activity within patient's tolerance, Monitored during session, Repositioned    Home Living Family/patient expects to be discharged to:: Private residence Living Arrangements: Spouse/significant other;Non-relatives/Friends Available Help at Discharge: Family;Available 24 hours/day Type of Home: House Home Access: Stairs to enter Entrance Stairs-Rails:  None Entrance  Stairs-Number of Steps: 12   Home Layout: One level Home Equipment: None Additional Comments: Pt lives with friend and friend's brother, available 24/7    Prior Function Prior Level of Function : Independent/Modified Independent      Mobility Comments: no AD use ADLs Comments: independent     Extremity/Trunk Assessment   Upper Extremity Assessment Upper Extremity Assessment: Overall WFL for tasks assessed    Lower Extremity Assessment Lower Extremity Assessment: Defer to PT evaluation    Cervical / Trunk Assessment Cervical / Trunk Assessment: Normal  Communication   Communication Communication: No apparent difficulties Cueing Techniques: Verbal cues  Cognition Arousal: Alert Behavior During Therapy: Restless, Impulsive Overall Cognitive Status: Within Functional Limits for tasks assessed    General Comments: needed encouragement to mobilize, restless about IV site access and bleeding from IV site        General Comments General comments (skin integrity, edema, etc.): IV site bleeding, RN present and aware.        PT Assessment Patient needs continued PT services  PT Problem List Decreased strength;Decreased activity tolerance;Decreased balance;Decreased mobility;Decreased knowledge of use of DME;Decreased safety awareness       PT Treatment Interventions DME instruction;Gait training;Stair training;Functional mobility training;Therapeutic activities;Therapeutic exercise;Balance training;Neuromuscular re-education;Patient/family education    PT Goals (Current goals can be found in the Care Plan section)  Acute Rehab PT Goals Patient Stated Goal: to get stronger PT Goal Formulation: With patient Time For Goal Achievement: 05/01/23 Potential to Achieve Goals: Good    Frequency Min 1X/week        AM-PAC PT "6 Clicks" Mobility  Outcome Measure Help needed turning from your back to your side while in a flat bed without using bedrails?:  None Help needed moving from lying on your back to sitting on the side of a flat bed without using bedrails?: None Help needed moving to and from a bed to a chair (including a wheelchair)?: A Little Help needed standing up from a chair using your arms (e.g., wheelchair or bedside chair)?: A Little Help needed to walk in hospital room?: A Little Help needed climbing 3-5 steps with a railing? : A Lot 6 Click Score: 19    End of Session   Activity Tolerance: Patient tolerated treatment well Patient left: in bed;with call bell/phone within reach;with bed alarm set Nurse Communication: Mobility status (IV site bleeding) PT Visit Diagnosis: Unsteadiness on feet (R26.81);Muscle weakness (generalized) (M62.81)    Time: 1129-1150 PT Time Calculation (min) (ACUTE ONLY): 21 min   Charges:   PT Evaluation $PT Eval Low Complexity: 1 Low   PT General Charges $$ ACUTE PT VISIT: 1 Visit        Hilton Cork, PT, DPT Secure Chat Preferred  Rehab Office [phone]   Arturo Morton Brion Aliment 04/17/2023, 2:46 PM

## 2023-04-17 NOTE — Patient Instructions (Signed)
  Medicaid Managed Care   Unsuccessful Outreach Note  04/17/2023 Name: [name] MRN: [id number] DOB: Apr 16, 1960  Referred by: [name], MD Reason for referral : High Risk Managed Medicaid (MM social work unsuccessful telephone outreach )   An unsuccessful telephone outreach was attempted today. The patient was referred to the case management team for assistance with care management and care coordination.   Follow Up Plan: A HIPAA compliant phone message was left for the patient providing contact information and requesting a return call.   Abelino Derrick, MHA [facility]  Managed [facility] Social Worker [phone]

## 2023-04-17 NOTE — Patient Outreach (Signed)
  Medicaid Managed Care   Unsuccessful Outreach Note  04/17/2023 Name: KENDAHL BUMGARDNER MRN: [id number] DOB: Apr 16, 1960  Referred by: [name], MD Reason for referral : High Risk Managed Medicaid (MM social work unsuccessful telephone outreach )   An unsuccessful telephone outreach was attempted today. The patient was referred to the case management team for assistance with care management and care coordination.   Follow Up Plan: A HIPAA compliant phone message was left for the patient providing contact information and requesting a return call.   Abelino Derrick, MHA [facility]  Managed [facility] Social Worker [phone]

## 2023-04-17 NOTE — Progress Notes (Addendum)
PROGRESS NOTE  [name] DOB: [birth year]   PCP: [name], MD  Patient is from: Home.  She states she lives with friend.  DOA: 04/15/2023 LOS: 1  Chief complaints Chief Complaint  Patient presents with   Multiple Complaints     Brief Narrative / Interim history: 63 year old F with PMH of ESRD not on HD for 2 months, COPD, CHF, HTN, HLD, hep C, anemia, anxiety, polysubstance use and noncompliance presenting with myalgia, generalized weakness and subjective fever, and admitted due to concern for right AV fistula infection, anemia and elevated troponin.  In ED, febrile to 100.9.  Slightly tachycardic and tachypneic.  BP elevated.  WBC 7.8.  Hgb 6.6 (baseline 7-8). Cr 5.82.  BUN 82.  Bicarb 10.  AG 17.  CK normal.  Lactic acid negative.  Anemia panel with low iron saturation.  Troponin 131=>137>135.  EKG NSR.  UA with moderate LE, > 300 protein and rare bacteria.  UDS, urine culture and blood culture ordered.  Patient was started on broad-spectrum antibiotics.  Nephrology and vascular surgery consulted.   UDS positive for amphetamine and cocaine.  Blood cultures NGTD.  Evaluated by vascular surgery.  Duplex ordered for AV fistula.  Remains on broad-spectrum antibiotics.  Palliative medicine consulted as well.  Subjective: Seen and examined earlier this morning.  No major events overnight of this morning.  Continues to endorse generalized body pain with more pain in right arm that she attributes to fistula.  Denies chest pain, shortness of breath, GI or UTI symptoms.  Reports making good amount of urine.  Objective: Vitals:   04/17/23 0838 04/17/23 0945 04/17/23 1000 04/17/23 1335  BP: (!) 152/69 (!) 154/73 (!) 140/66 (!) 171/73  Pulse: 73 76 72 77  Resp: 18 18 18 20   Temp: 98 F (36.7 C) 97.9 F (36.6 C) 97.9 F (36.6 C) 97.9 F (36.6 C)  TempSrc: Oral Oral Oral Oral  SpO2: 100% 100% 100% 100%  Weight:        Examination:  GENERAL: No apparent  distress.  Nontoxic. HEENT: MMM.  Vision and hearing grossly intact.  NECK: Supple.  No apparent JVD.  RESP:  No IWOB.  Fair aeration bilaterally. CVS:  RRR. Heart sounds normal.  Right arm AVF with bruits.  Slightly increased swelling, redness and warmth posterior to aVF. ABD/GI/GU: BS+. Abd soft, NTND.  MSK/EXT:  Moves extremities. No apparent deformity. No edema.  Fistula site as above. SKIN: Fistula site as above. NEURO: Awake, alert and oriented appropriately.  No apparent focal neuro deficit. PSYCH: Calm. Normal affect.   Procedures:  None  Microbiology summarized: COVID-19, influenza and RSV PCR nonreactive Blood cultures NGTD Urine culture with multiple species.  Assessment and plan: Sepsis due to right arm cellulitis with possible AV fistula involvement: Patient has fever, tachycardia and tachypnea on presentation.  Has no leukocytosis.  Some swelling, redness and increased warmth to touch at AV fistula site.  UDS positive for cocaine and amphetamine.  Patient denied IV drug use until confronted by results.  aVF Doppler without significant finding.   Blood cultures NGTD -Continue vancomycin and Zosyn -Appreciate input by vascular -Counseled on the importance of refraining from drug use.   Acute on chronic anemia: Baseline Hgb 7-8.  No overt bleeding.  Hemoccult negative.  Anemia panel with low iron saturation.  Not on anticoagulation or antiplatelet.  Suspect dilutional drop from CKD-5. Recent Labs    01/28/23 0412 01/29/23 0239 01/31/23 0758 01/31/23 1120 02/01/23 0845 02/10/23  1344 04/15/23 1616 04/16/23 0953 04/16/23 1433 04/17/23 0454  HGB 8.4* 7.9* 8.2* 9.1* 7.6* 7.6* 6.6* 6.6* 7.0* 6.6*  -Appreciate nephrology.  Transfusing 1 unit.  IV iron and Aranesp -Check posttransfusion H&H -Continue monitoring   Myalgia/chronic pain syndrome: Myalgia likely due to substance use.  UDS positive for cocaine and amphetamine.  CK within normal.  COVID-19, influenza and RSV  PCR nonreactive -Tylenol as needed   ESRD/CKD-5: Reports making urine.  Last HD in November.  Followed by Dr. Ronalee Belts.  -Nephrology on board.  Hoping to restart dialysis -Palliative consulted per recommendation by nephrology due to noncompliance with HD  Anion gap metabolic acidosis: Likely due to azotemia -Increase sodium bicarbonate. -Monitor   Polysubstance use: Patient denied recreational drug use until confronted by UDS result which is positive for cocaine and amphetamine.  She admitted to snorting cocaine but denies IVDU. -Advised to refrain from recreational drugs -Encouraged smoking cessation -Nicotine patch  Chronic COPD: Stable -Nebulizer/inhaler as needed -Encourage smoking cessation   Essential hypertension: BP improved. -Continue Norvasc 10 mg daily  Hypokalemia: K3.2. -P.o. KCl 40 x 1  Hyperphosphatemia -Binder per nephrology   Hepatitis C -Follow viral load  Anxiety and depression: Stable -Resume home meds after med rec  Underweight Body mass index is 17.23 kg/m. -Consult dietitian   DVT prophylaxis:  heparin injection 5,000 Units Start: 04/16/23 0600  Code Status: Full code Family Communication: None at bedside Level of care: Med-Surg Status is: Inpatient Remains inpatient appropriate because: Concern for AV fistula infection, ESRD/CKD and anemia   Final disposition: Likely home Consultants:  Nephrology Vascular surgery  55 minutes with more than 50% spent in reviewing records, counseling patient/family and coordinating care.   Sch Meds:  Scheduled Meds:  busPIRone  10 mg Oral BID   calcium acetate  1,334 mg Oral TID WC   darbepoetin (ARANESP) injection - NON-DIALYSIS  60 mcg Subcutaneous Q Wed-1800   fluticasone furoate-vilanterol  1 puff Inhalation Daily   And   umeclidinium bromide  1 puff Inhalation Daily   gabapentin  100 mg Oral TID   heparin  5,000 Units Subcutaneous Q8H   lidocaine  3 patch Transdermal Q24H   multivitamin   1 tablet Oral QHS   nicotine  14 mg Transdermal Daily   pantoprazole  40 mg Oral Daily   sodium bicarbonate  1,300 mg Oral TID   vancomycin variable dose per unstable renal function (pharmacist dosing)   Does not apply See admin instructions   Continuous Infusions:  iron sucrose 200 mg (04/17/23 1407)   piperacillin-tazobactam (ZOSYN)  IV 2.25 g (04/17/23 0526)   PRN Meds:.acetaminophen **OR** acetaminophen, hydrOXYzine, ipratropium-albuterol, ondansetron **OR** ondansetron (ZOFRAN) IV, senna-docusate, traZODone  Antimicrobials: Anti-infectives (From admission, onward)    Start     Dose/Rate Route Frequency Ordered Stop   04/16/23 1400  piperacillin-tazobactam (ZOSYN) IVPB 2.25 g        2.25 g 100 mL/hr over 30 Minutes Intravenous Every 8 hours 04/16/23 0531     04/16/23 0535  vancomycin variable dose per unstable renal function (pharmacist dosing)         Does not apply See admin instructions 04/16/23 0535     04/16/23 0300  vancomycin (VANCOCIN) IVPB 1000 mg/200 mL premix        1,000 mg 200 mL/hr over 60 Minutes Intravenous  Once 04/16/23 0249 04/16/23 0735   04/16/23 0300  piperacillin-tazobactam (ZOSYN) IVPB 3.375 g        3.375 g 100 mL/hr over  30 Minutes Intravenous  Once 04/16/23 0249 04/16/23 0604   04/15/23 2115  cefTRIAXone (ROCEPHIN) 2 g in sodium chloride 0.9 % 100 mL IVPB        2 g 200 mL/hr over 30 Minutes Intravenous  Once 04/15/23 2102 04/15/23 2333        I have personally reviewed the following labs and images: CBC: Recent Labs  Lab 04/15/23 1616 04/16/23 0953 04/16/23 1433 04/17/23 0454  WBC 7.8 4.8 5.2 3.5*  NEUTROABS 6.6 3.5  --   --   HGB 6.6* 6.6* 7.0* 6.6*  HCT 19.7* 19.8* 21.0* 18.4*  MCV 100.5* 99.5 100.0 95.3  PLT 293 211 211 203   BMP &GFR Recent Labs  Lab 04/15/23 1616 04/16/23 0952 04/17/23 0454  NA 135 130* 130*  K 4.3 3.3* 3.2*  CL 108 110 109  CO2 10* 10* 11*  GLUCOSE 111* 99 103*  BUN 82* 75* 83*  CREATININE 5.82* 5.82*  6.28*  CALCIUM 9.1 8.0* 7.7*  MG  --  1.7 1.8  PHOS  --  5.4* 6.1*   Estimated Creatinine Clearance: 7.3 mL/min (A) (by C-G formula based on SCr of 6.28 mg/dL (H)). Liver & Pancreas: Recent Labs  Lab 04/16/23 0952 04/17/23 0454  AST 17  --   ALT 18  --   ALKPHOS 98  --   BILITOT 0.5  --   PROT 5.9*  --   ALBUMIN 2.7* 2.5*   No results for input(s): "LIPASE", "AMYLASE" in the last 168 hours. No results for input(s): "AMMONIA" in the last 168 hours. Diabetic: No results for input(s): "HGBA1C" in the last 72 hours. No results for input(s): "GLUCAP" in the last 168 hours. Cardiac Enzymes: Recent Labs  Lab 04/15/23 1616  CKTOTAL 72   No results for input(s): "PROBNP" in the last 8760 hours. Coagulation Profile: Recent Labs  Lab 04/16/23 0952  INR 1.2   Thyroid Function Tests: No results for input(s): "TSH", "T4TOTAL", "FREET4", "T3FREE", "THYROIDAB" in the last 72 hours. Lipid Profile: No results for input(s): "CHOL", "HDL", "LDLCALC", "TRIG", "CHOLHDL", "LDLDIRECT" in the last 72 hours. Anemia Panel: Recent Labs    04/15/23 1900  VITAMINB12 387  FOLATE >40.0  FERRITIN 269  TIBC 297  IRON 24*  RETICCTPCT 2.9   Urine analysis:    Component Value Date/Time   COLORURINE YELLOW 04/16/2023 0200   APPEARANCEUR HAZY (A) 04/16/2023 0200   LABSPEC 1.009 04/16/2023 0200   PHURINE 6.0 04/16/2023 0200   GLUCOSEU 50 (A) 04/16/2023 0200   HGBUR SMALL (A) 04/16/2023 0200   BILIRUBINUR NEGATIVE 04/16/2023 0200   KETONESUR NEGATIVE 04/16/2023 0200   PROTEINUR >=300 (A) 04/16/2023 0200   UROBILINOGEN 0.2 03/02/2010 2027   NITRITE NEGATIVE 04/16/2023 0200   LEUKOCYTESUR MODERATE (A) 04/16/2023 0200   Sepsis Labs: Invalid input(s): "PROCALCITONIN", "LACTICIDVEN"  Microbiology: Recent Results (from the past 240 hours)  Resp panel by RT-PCR (RSV, Flu A&B, Covid) Anterior Nasal Swab     Status: None   Collection Time: 04/15/23  3:56 PM   Specimen: Anterior Nasal Swab   Result Value Ref Range Status   SARS Coronavirus 2 by RT PCR NEGATIVE NEGATIVE Final   Influenza A by PCR NEGATIVE NEGATIVE Final   Influenza B by PCR NEGATIVE NEGATIVE Final    Comment: (NOTE) The Xpert Xpress SARS-CoV-2/FLU/RSV plus assay is intended as an aid in the diagnosis of influenza from Nasopharyngeal swab specimens and should not be used as a sole basis for treatment. Nasal washings and  aspirates are unacceptable for Xpert Xpress SARS-CoV-2/FLU/RSV testing.  Fact Sheet for Patients: BloggerCourse.com  Fact Sheet for Healthcare Providers: SeriousBroker.it  This test is not yet approved or cleared by the [place] FDA and has been authorized for detection and/or diagnosis of SARS-CoV-2 by FDA under an Emergency Use Authorization (EUA). This EUA will remain in effect (meaning this test can be used) for the duration of the COVID-19 declaration under Section 564(b)(1) of the Act, 21 U.S.C. section 360bbb-3(b)(1), unless the authorization is terminated or revoked.     Resp Syncytial Virus by PCR NEGATIVE NEGATIVE Final    Comment: (NOTE) Fact Sheet for Patients: BloggerCourse.com  Fact Sheet for Healthcare Providers: SeriousBroker.it  This test is not yet approved or cleared by the [place] FDA and has been authorized for detection and/or diagnosis of SARS-CoV-2 by FDA under an Emergency Use Authorization (EUA). This EUA will remain in effect (meaning this test can be used) for the duration of the COVID-19 declaration under Section 564(b)(1) of the Act, 21 U.S.C. section 360bbb-3(b)(1), unless the authorization is terminated or revoked.  Performed at [facility], [address] [place], [place] [address]   Blood culture (routine x 2)     Status: None (Preliminary result)   Collection Time: 04/15/23  6:58 PM   Specimen: BLOOD LEFT ARM  Result Value  Ref Range Status   Specimen Description BLOOD LEFT ARM  Final   Special Requests   Final    BOTTLES DRAWN AEROBIC AND ANAEROBIC Blood Culture results may not be optimal due to an inadequate volume of blood received in culture bottles   Culture   Final    NO GROWTH 2 DAYS Performed at [facility], [address] [place], [place] [address]    Report Status PENDING  Incomplete  Blood culture (routine x 2)     Status: None (Preliminary result)   Collection Time: 04/15/23  6:58 PM   Specimen: BLOOD LEFT ARM  Result Value Ref Range Status   Specimen Description BLOOD LEFT ARM  Final   Special Requests   Final    AEROBIC BOTTLE ONLY Blood Culture results may not be optimal due to an inadequate volume of blood received in culture bottles   Culture   Final    NO GROWTH 2 DAYS Performed at [facility], [address], [place], [place] [address]    Report Status PENDING  Incomplete  Urine Culture (for pregnant, neutropenic or urologic patients or patients with an indwelling urinary catheter)     Status: Abnormal   Collection Time: 04/16/23  2:00 AM   Specimen: In/Out Cath Urine  Result Value Ref Range Status   Specimen Description IN/OUT CATH URINE  Final   Special Requests   Final    NONE Performed at [facility], [address] [place], [place] [address]    Culture MULTIPLE SPECIES PRESENT, SUGGEST RECOLLECTION (A)  Final   Report Status 04/17/2023 FINAL  Final    Radiology Studies: VAS US DUPLEX DIALYSIS ACCESS (AVF, AVG) Result Date: 04/16/2023 DIALYSIS ACCESS Patient Name:  [name]  Date of Exam:   04/16/2023 Medical Rec #: [id number]       Accession #:    [id number] Date of Birth: 1960/07/11       Patient Gender: F Patient Age:   56 years Exam Location:  [facility] Procedure:      VAS US DUPLEX DIALYSIS ACCESS (AVF, AVG) Referring  Phys: Lemar Livings --------------------------------------------------------------------------------  Reason for Exam:  Mechanical complication of AVF. Access Site: Right Upper Extremity. Access Type: Brachial-basilic AVF. Performing Technologist: Ernestene Mention RVT, RDMS  Examination Guidelines: A complete evaluation includes B-mode imaging, spectral Doppler, color Doppler, and power Doppler as needed of all accessible portions of each vessel. Unilateral testing is considered an integral part of a complete examination. Limited examinations for reoccurring indications may be performed as noted.  Findings: +--------------------+----------+-----------------+--------+ AVF                 PSV (cm/s)Flow Vol (mL/min)Comments +--------------------+----------+-----------------+--------+ Native artery inflow   209           561                +--------------------+----------+-----------------+--------+ AVF Anastomosis        289                              +--------------------+----------+-----------------+--------+  +------------+----------+-------------+----------+--------+ OUTFLOW VEINPSV (cm/s)Diameter (cm)Depth (cm)Describe +------------+----------+-------------+----------+--------+ Prox UA        268                                    +------------+----------+-------------+----------+--------+ Mid UA         139                                    +------------+----------+-------------+----------+--------+ Dist UA        319                                    +------------+----------+-------------+----------+--------+ AC Fossa       265                                    +------------+----------+-------------+----------+--------+   Summary: Patent AVF with volume flow <600 cm/s.  *See table(s) above for measurements and observations.  Diagnosing physician: Heath Lark Electronically signed by Heath Lark on 04/16/2023 at 4:17:01 PM.    --------------------------------------------------------------------------------   Final       Yaniyah Koors T. Wandy Bossler [facility]  If 7PM-7AM, please  contact night-coverage [email or web address] 04/17/2023, 2:13 PM

## 2023-04-17 NOTE — Progress Notes (Addendum)
Progress Note    04/17/2023 6:36 AM Hospital Day 1  Subjective:  continues to have pain right upper arm.  Denies pain in her hand.  Says they have a patch on her back for pain that is not helping.   Tm 101 yesterday 0500.  Tm 99.4 past 24 hr and now afebrile  Vitals:   04/16/23 2115 04/17/23 0327  BP: (!) 154/66 (!) 174/74  Pulse: 85 72  Resp: 20 20  Temp: 98 F (36.7 C) 97.8 F (36.6 C)  SpO2: 100% 100%    Physical Exam: General:  no distress; sitting with her right arm/hand up behind her head Lungs:  non labored Extremities:  excellent thrill in fistula; palpable right radial pulse; erythema right upper arm. Painful and warm to palpation  CBC    Component Value Date/Time   WBC 3.5 (L) 04/17/2023 0454   RBC 1.93 (L) 04/17/2023 0454   HGB 6.6 (LL) 04/17/2023 0454   HGB 6.0 (LL) 06/06/2022 1410   HCT 18.4 (L) 04/17/2023 0454   HCT 18.1 (L) 06/06/2022 1410   PLT 203 04/17/2023 0454   PLT 353 06/06/2022 1410   MCV 95.3 04/17/2023 0454   MCV 96 06/06/2022 1410   MCH 34.2 (H) 04/17/2023 0454   MCHC 35.9 04/17/2023 0454   RDW 15.0 04/17/2023 0454   RDW 13.6 06/06/2022 1410   LYMPHSABS 1.2 04/16/2023 0953   LYMPHSABS 1.3 06/06/2022 1410   MONOABS 0.1 04/16/2023 0953   EOSABS 0.0 04/16/2023 0953   EOSABS 0.4 06/06/2022 1410   BASOSABS 0.0 04/16/2023 0953   BASOSABS 0.0 06/06/2022 1410    BMET    Component Value Date/Time   NA 130 (L) 04/17/2023 0454   NA 137 06/06/2022 1410   K 3.2 (L) 04/17/2023 0454   CL 109 04/17/2023 0454   CO2 11 (L) 04/17/2023 0454   GLUCOSE 103 (H) 04/17/2023 0454   BUN 83 (H) 04/17/2023 0454   BUN 37 (H) 06/06/2022 1410   CREATININE 6.28 (H) 04/17/2023 0454   CREATININE 1.64 (H) 04/29/2016 1047   CALCIUM 7.7 (L) 04/17/2023 0454   CALCIUM 7.9 (L) 08/15/2022 0104   GFRNONAA 7 (L) 04/17/2023 0454   GFRNONAA 35 (L) 04/29/2016 1047   GFRAA 36 (L) 08/31/2019 1230   GFRAA 40 (L) 04/29/2016 1047    INR    Component Value Date/Time    INR 1.2 04/16/2023 0952     Intake/Output Summary (Last 24 hours) at 04/17/2023 0636 Last data filed at 04/17/2023 0400 Gross per 24 hour  Intake 450.67 ml  Output --  Net 450.67 ml     Assessment/Plan:  63 y.o. female with erythema over right arm fistula  Hospital Day 1   -duplex yesterday with no mention of fluid collection around fistula.  Blood cx < 12 hr without growth.  Pt on vanc/zosyn -no improvement in pain.  Continues to have some erythema but not much different that picture from yesterday.   -continue IV abx and Dr. Randie Heinz will see pt later this morning.    Doreatha Massed, PA-C [facility] [phone] 04/17/2023 6:36 AM  I have independently interviewed and examined patient and agree with PA assessment and plan above.  As above there is a strong thrill with induration in the fistula that has not been cannulated in 2 months not entirely clear the etiology.  Continue antibiotics and hopefully this is cellulitis that will resolve.  Owen Pagnotta C. Randie Heinz, MD [facility] [place] Office: [phone]  Pager: [phone]

## 2023-04-18 DIAGNOSIS — N186 End stage renal disease: Secondary | ICD-10-CM | POA: Diagnosis not present

## 2023-04-18 DIAGNOSIS — Z515 Encounter for palliative care: Secondary | ICD-10-CM

## 2023-04-18 DIAGNOSIS — Z7189 Other specified counseling: Secondary | ICD-10-CM

## 2023-04-18 DIAGNOSIS — T827XXA Infection and inflammatory reaction due to other cardiac and vascular devices, implants and grafts, initial encounter: Secondary | ICD-10-CM | POA: Diagnosis not present

## 2023-04-18 DIAGNOSIS — G894 Chronic pain syndrome: Secondary | ICD-10-CM | POA: Diagnosis not present

## 2023-04-18 DIAGNOSIS — B1920 Unspecified viral hepatitis C without hepatic coma: Secondary | ICD-10-CM

## 2023-04-18 DIAGNOSIS — D649 Anemia, unspecified: Secondary | ICD-10-CM | POA: Diagnosis not present

## 2023-04-18 LAB — TYPE AND SCREEN
ABO/RH(D): A POS
Antibody Screen: NEGATIVE
Unit division: 0
Unit division: 0

## 2023-04-18 LAB — BPAM RBC
Blood Product Expiration Date: 202502162359
Blood Product Expiration Date: 202502242359
ISSUE DATE / TIME: 202501290323
ISSUE DATE / TIME: 202501300943
Unit Type and Rh: 6200
Unit Type and Rh: 6200

## 2023-04-18 LAB — RENAL FUNCTION PANEL
Albumin: 2.6 g/dL — ABNORMAL LOW (ref 3.5–5.0)
Anion gap: 10 (ref 5–15)
BUN: 78 mg/dL — ABNORMAL HIGH (ref 8–23)
CO2: 12 mmol/L — ABNORMAL LOW (ref 22–32)
Calcium: 8 mg/dL — ABNORMAL LOW (ref 8.9–10.3)
Chloride: 113 mmol/L — ABNORMAL HIGH (ref 98–111)
Creatinine, Ser: 6.58 mg/dL — ABNORMAL HIGH (ref 0.44–1.00)
GFR, Estimated: 7 mL/min — ABNORMAL LOW (ref >60)
Glucose, Bld: 102 mg/dL — ABNORMAL HIGH (ref 70–99)
Phosphorus: 6.7 mg/dL — ABNORMAL HIGH (ref 2.5–4.6)
Potassium: 3.7 mmol/L (ref 3.5–5.1)
Sodium: 135 mmol/L (ref 135–145)

## 2023-04-18 LAB — VANCOMYCIN, RANDOM: Vancomycin Rm: 11 ug/mL

## 2023-04-18 MED ORDER — NEPRO/CARBSTEADY PO LIQD
237.0000 mL | Freq: Two times a day (BID) | ORAL | Status: DC
  Administered 2023-04-19 – 2023-04-21 (×5): 237 mL via ORAL

## 2023-04-18 MED ORDER — CEFTRIAXONE SODIUM 1 G IJ SOLR
1.0000 g | INTRAMUSCULAR | Status: DC
  Administered 2023-04-18 – 2023-04-20 (×3): 1 g via INTRAVENOUS
  Filled 2023-04-18 (×3): qty 10

## 2023-04-18 MED ORDER — VANCOMYCIN HCL 750 MG IV SOLR
750.0000 mg | Freq: Once | INTRAVENOUS | Status: AC
  Administered 2023-04-18: 750 mg via INTRAVENOUS
  Filled 2023-04-18: qty 15

## 2023-04-18 MED ORDER — TRAMADOL HCL 50 MG PO TABS
50.0000 mg | ORAL_TABLET | Freq: Two times a day (BID) | ORAL | Status: DC | PRN
  Administered 2023-04-18 – 2023-04-20 (×4): 50 mg via ORAL
  Filled 2023-04-18 (×4): qty 1

## 2023-04-18 MED ORDER — VANCOMYCIN HCL 750 MG/150ML IV SOLN
750.0000 mg | Freq: Once | INTRAVENOUS | Status: DC
  Filled 2023-04-18: qty 150

## 2023-04-18 MED ORDER — DARBEPOETIN ALFA 200 MCG/0.4ML IJ SOSY: 200.0000 ug | PREFILLED_SYRINGE | INTRAMUSCULAR | Status: DC

## 2023-04-18 MED ORDER — LOPERAMIDE HCL 2 MG PO CAPS
2.0000 mg | ORAL_CAPSULE | ORAL | Status: DC | PRN
  Administered 2023-04-18 – 2023-04-19 (×3): 2 mg via ORAL
  Filled 2023-04-18 (×3): qty 1

## 2023-04-18 NOTE — Progress Notes (Signed)
     Referral received for [facility] [name]: goals of care discussion. Chart reviewed and updates received from RN. Patient assessed and does not want to speak at this time. Requests follow up later in the day.  Thank you for your referral and allowing [facility] to assist in [name]'s care.   Sarina Ser, NP Palliative Medicine Team  Team Phone # [phone]   NO CHARGE

## 2023-04-18 NOTE — Progress Notes (Signed)
Pharmacy Antibiotic Note  ADRIEANA [name] is a 63 y.o. female admitted on 04/15/2023 with  concern for AV fistula infection .  Pharmacy has been consulted for Vancomycin and Zosyn dosing.  CKD stage V and hx hemodialysis, but last session about 3 months ago.  Renal monitoring  AV fistula site improvement and for resuming HD slowly when appropriate.   Vancomycin 1gm IV given on 1/29 and and random level 11 mcg/ml this am. Blood cultures negative to date.  Plan: Vancomycin 750 mg IV x 1 today to boost level to estimated 25-28 mcg/ml Monitor for HD plans. Re-dose Vanc when levels are in 10-15 mcg/ml range. Continue Zosyn 2.25 gm IV q8h. Follow up final culture, clinical progress and antibiotic plans.  Weight: 49.9 kg (110 lb)  Temp (24hrs), Avg:98.1 F (36.7 C), Min:97.9 F (36.6 C), Max:98.7 F (37.1 C)  Recent Labs  Lab 04/15/23 1616 04/15/23 1850 04/15/23 2023 04/16/23 0314 04/16/23 0952 04/16/23 0953 04/16/23 1433 04/17/23 0454 04/18/23 0552  WBC 7.8  --   --   --   --  4.8 5.2 3.5*  --   CREATININE 5.82*  --   --   --  5.82*  --   --  6.28* 6.58*  LATICACIDVEN  --  0.3* 0.4* 0.6  --   --   --   --   --   VANCORANDOM  --   --   --   --   --   --   --   --  11    Estimated Creatinine Clearance: 7 mL/min (A) (by C-G formula based on SCr of 6.58 mg/dL (H)).    Allergies  Allergen Reactions   Zestril [Lisinopril] Other (See Comments)    Hyperkalemia    Antimicrobials this admission: Vancomycin 1/28>> Zosyn 1/28>> Ceftriaxone x 1 on 1/28  Dose adjustments this admission: 1/31 VR 11 ~48 hrs after dose 1gm dose on 1/29 > Vanc 750 IV x 1 today  Microbiology results: 1/28 blood: no growth x 3 days to date 1/29 urine: multiple species 1/28 COVID, flu and RSV: neg  Thank you for allowing pharmacy to be a part of this patient's care.  Dennie Fetters, RPh 04/18/2023 9:41 AM

## 2023-04-18 NOTE — Progress Notes (Signed)
Pt well known to navigator from past admissions. Case discussed with RN CM yesterday. Pt currently does not have an accepting out-pt HD clinic. In the past, pt was an area denial for any [facility] clinic due to non-compliance. Pt did not have a desire for a referral to made to any providers in [facility] or [place]. In addition, transportation would be a barrier as well if outside of [place] area. Will assist as needed.   Olivia Canter Renal Navigator [phone]

## 2023-04-18 NOTE — Progress Notes (Signed)
Patient ID: [name], female   DOB: 05/11/1960, 63 y.o.   MRN: [id number]  S: No new complaints.  UOP not well recorded-  BUN down slightly and crt up slightly.  Resting comfortably   O:BP (!) 160/78 (BP Location: Left Arm)   Pulse 72   Temp 97.9 F (36.6 C) (Oral)   Resp 18   Wt 49.9 kg   SpO2 100%   BMI 17.23 kg/m   Intake/Output Summary (Last 24 hours) at 04/18/2023 1109 Last data filed at 04/18/2023 0800 Gross per 24 hour  Intake 1490 ml  Output --  Net 1490 ml   Intake/Output: I/O last 3 completed shifts: In: 1820 [P.O.:840; Blood:460; IV Piggyback:520] Out: -   Intake/Output this shift:  Total I/O In: 240 [P.O.:240] Out: -  Weight change:  Gen: NAD CVS: RRR Resp:CTA Abd: +BS, soft, NT/ND Ext: +erythema and induration of right antecubital area, RUE AVF +T/B  Recent Labs  Lab 04/15/23 1616 04/16/23 0952 04/17/23 0454 04/18/23 0552  NA 135 130* 130* 135  K 4.3 3.3* 3.2* 3.7  CL 108 110 109 113*  CO2 10* 10* 11* 12*  GLUCOSE 111* 99 103* 102*  BUN 82* 75* 83* 78*  CREATININE 5.82* 5.82* 6.28* 6.58*  ALBUMIN  --  2.7* 2.5* 2.6*  CALCIUM 9.1 8.0* 7.7* 8.0*  PHOS  --  5.4* 6.1* 6.7*  AST  --  17  --   --   ALT  --  18  --   --    Liver Function Tests: Recent Labs  Lab 04/16/23 0952 04/17/23 0454 04/18/23 0552  AST 17  --   --   ALT 18  --   --   ALKPHOS 98  --   --   BILITOT 0.5  --   --   PROT 5.9*  --   --   ALBUMIN 2.7* 2.5* 2.6*   No results for input(s): "LIPASE", "AMYLASE" in the last 168 hours. No results for input(s): "AMMONIA" in the last 168 hours. CBC: Recent Labs  Lab 04/15/23 1616 04/16/23 0953 04/16/23 1433 04/17/23 0454 04/17/23 1531  WBC 7.8 4.8 5.2 3.5*  --   NEUTROABS 6.6 3.5  --   --   --   HGB 6.6* 6.6* 7.0* 6.6* 7.6*  HCT 19.7* 19.8* 21.0* 18.4* 21.3*  MCV 100.5* 99.5 100.0 95.3  --   PLT 293 211 211 203  --    Cardiac Enzymes: Recent Labs  Lab 04/15/23 1616  CKTOTAL 72   CBG: No results for input(s):  "GLUCAP" in the last 168 hours.  Iron Studies:  Recent Labs    04/15/23 1900  IRON 24*  TIBC 297  FERRITIN 269   Studies/Results: VAS US DUPLEX DIALYSIS ACCESS (AVF, AVG) Result Date: 04/16/2023 DIALYSIS ACCESS Patient Name:  [name]  Date of Exam:   04/16/2023 Medical Rec #: [id number]       Accession #:    [id number] Date of Birth: [birth year]       Patient Gender: F Patient Age:   33 years Exam Location:  [facility] Procedure:      VAS US DUPLEX DIALYSIS ACCESS (AVF, AVG) Referring Phys: Lemar Livings --------------------------------------------------------------------------------  Reason for Exam: Mechanical complication of AVF. Access Site: Right Upper Extremity. Access Type: Brachial-basilic AVF. Performing Technologist: Ernestene Mention RVT, RDMS  Examination Guidelines: A complete evaluation includes B-mode imaging, spectral Doppler, color Doppler, and power Doppler as needed of all accessible portions of each vessel.  Unilateral testing is considered an integral part of a complete examination. Limited examinations for reoccurring indications may be performed as noted.  Findings: +--------------------+----------+-----------------+--------+ AVF                 PSV (cm/s)Flow Vol (mL/min)Comments +--------------------+----------+-----------------+--------+ Native artery inflow   209           561                +--------------------+----------+-----------------+--------+ AVF Anastomosis        289                              +--------------------+----------+-----------------+--------+  +------------+----------+-------------+----------+--------+ OUTFLOW VEINPSV (cm/s)Diameter (cm)Depth (cm)Describe +------------+----------+-------------+----------+--------+ Prox UA        268                                    +------------+----------+-------------+----------+--------+ Mid UA         139                                     +------------+----------+-------------+----------+--------+ Dist UA        319                                    +------------+----------+-------------+----------+--------+ AC Fossa       265                                    +------------+----------+-------------+----------+--------+   Summary: Patent AVF with volume flow <600 cm/s.  *See table(s) above for measurements and observations.  Diagnosing physician: Heath Lark Electronically signed by Heath Lark on 04/16/2023 at 4:17:01 PM.    --------------------------------------------------------------------------------   Final     busPIRone  10 mg Oral BID   calcium acetate  1,334 mg Oral TID WC   darbepoetin (ARANESP) injection - NON-DIALYSIS  60 mcg Subcutaneous Q Wed-1800   fluticasone furoate-vilanterol  1 puff Inhalation Daily   And   umeclidinium bromide  1 puff Inhalation Daily   gabapentin  100 mg Oral TID   heparin  5,000 Units Subcutaneous Q8H   lidocaine  3 patch Transdermal Q24H   multivitamin  1 tablet Oral QHS   nicotine  14 mg Transdermal Daily   pantoprazole  40 mg Oral Daily   sodium bicarbonate  1,300 mg Oral TID   torsemide  100 mg Oral Daily   vancomycin variable dose per unstable renal function (pharmacist dosing)   Does not apply See admin instructions    BMET    Component Value Date/Time   NA 135 04/18/2023 0552   NA 137 06/06/2022 1410   K 3.7 04/18/2023 0552   CL 113 (H) 04/18/2023 0552   CO2 12 (L) 04/18/2023 0552   GLUCOSE 102 (H) 04/18/2023 0552   BUN 78 (H) 04/18/2023 0552   BUN 37 (H) 06/06/2022 1410   CREATININE 6.58 (H) 04/18/2023 0552   CREATININE 1.64 (H) 04/29/2016 1047   CALCIUM 8.0 (L) 04/18/2023 0552   CALCIUM 7.9 (L) 08/15/2022 0104   GFRNONAA 7 (L) 04/18/2023 0552   GFRNONAA 35 (L) 04/29/2016 1047   GFRAA 36 (L)  08/31/2019 1230   GFRAA 40 (L) 04/29/2016 1047   CBC    Component Value Date/Time   WBC 3.5 (L) 04/17/2023 0454   RBC 1.93 (L) 04/17/2023 0454   HGB 7.6 (L)  04/17/2023 1531   HGB 6.0 (LL) 06/06/2022 1410   HCT 21.3 (L) 04/17/2023 1531   HCT 18.1 (L) 06/06/2022 1410   PLT 203 04/17/2023 0454   PLT 353 06/06/2022 1410   MCV 95.3 04/17/2023 0454   MCV 96 06/06/2022 1410   MCH 34.2 (H) 04/17/2023 0454   MCHC 35.9 04/17/2023 0454   RDW 15.0 04/17/2023 0454   RDW 13.6 06/06/2022 1410   LYMPHSABS 1.2 04/16/2023 0953   LYMPHSABS 1.3 06/06/2022 1410   MONOABS 0.1 04/16/2023 0953   EOSABS 0.0 04/16/2023 0953   EOSABS 0.4 06/06/2022 1410   BASOSABS 0.0 04/16/2023 0953   BASOSABS 0.0 06/06/2022 1410    Assessment/Plan:  Cellulitis of RUE - seen by Dr. Randie Heinz and agree with IV antibiotics.  Currently on zosyn and vancomycin with improvement of erythema and induration.  Still remains painful to touch. Acute on chronic anemia of CKD stage V - will give IV iron and resume ESA and follow.  Transfuse for hgb <7. CKD stage V - no urgent indication to resume HD and would not want to use her AVF for now given pain and infection.  Would not want to place an HD catheter either given her noncompliance with HD.  Hopefully after a day or so of antibiotics we can use her avf without issue of pain.  Need palliative care to discuss pt about not resuming HD given her multiple admissions due to noncompliance.  Will continue to follow for now.  Will need to restart slow start dialysis since she has not had it in 3 months. I asked her if she wanted to resume-  she is crying-  I dont think she wants to be on chronic HD " I dont want that to be my life"  she seems content with her arrangement of missing for months, then coming  to hospital    Myalgia/chronic pain - negative covid-19, flu, and RSV PCR.  Likely due to ongoing substance use. Hypokalmeia - replete gingerly given CKD stage V. Polysubstance abuse - initially denied use.  Admitted to snorting cocaine and amphetamine after discussing her UDS.  She denied any IV drug use or skin popping in her right arm. Homelessness -  will need SW assistance prior to discharge. Hepatitis c - hep C ab + will order HCV RNA. Metabolic acidosis - will start oral sodium bicarb until we can use AVF for resumption of HD.  [name]  [employer] [phone]

## 2023-04-18 NOTE — Progress Notes (Signed)
PT Cancellation Note  Patient Details Name: [name] MRN: [id number] DOB: October 04, 1960   Cancelled Treatment:    Reason Eval/Treat Not Completed: Patient declined, no reason specified (Pt reported having loose stool and did not want to work with PT. Acute PT to re-attempt as able.)  Hilton Cork, PT, DPT Secure Chat Preferred  Rehab Office [phone]  Arturo Morton Brion Aliment 04/18/2023, 1:42 PM

## 2023-04-18 NOTE — Consult Note (Signed)
Palliative Care Consult Note                                  Date: 04/18/2023   Patient Name: [name]  DOB: [birth year]  MRN: [id number]  Age / Sex: 63 y.o., female  PCP: [name], MD Referring Physician: Almon Hercules, MD  Reason for Consultation: Establishing goals of care  HPI/Patient Profile: 63 y.o. female  with past medical history of ESRD with noncompliance, DMII, COPD, PAD, GERD, GIB, AVM, CHF, anemia, polysubstance abuse, HLD, hep C, and tobacco use disorder admitted on 04/15/2023 with shortness of breath and myalgias. She was admitted due to concern for right AV fistula infection, anemia and elevated troponin.   UDS positive for amphetamine and cocaine. Blood cultures NGTD. Evaluated by vascular surgery. Duplex ordered for AV fistula. Remains on broad-spectrum antibiotics   Past Medical History:  Diagnosis Date   Alcohol abuse    Allergy    Anxiety    Arthritis    Asthma    Cannabis abuse    Cocaine abuse (HCC)    COPD (chronic obstructive pulmonary disease) (HCC)    Depression    Drug addiction (HCC)    GERD (gastroesophageal reflux disease)    Heart murmur    Homelessness    Hyperlipidemia    Hypertension    pt stated "every once in a while BP will be high but has not been prescribed medication for HTN.    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 She did not wish for family or friends to be present for conversation.  I introduced Palliative Medicine as specialized medical care for people living with serious illness. It  focuses on providing relief from symptoms and stress of a serious illness. The goal is to improve quality of life for both the patient and the family.  Today's Discussion: Patient has a good understanding of her chronic diseases. She understands that her chronic diseases impact each other. She has not been to dialysis since November. She shares it was impossible to get to dialysis when she did not have a stable living environment. At times dialysis would be scheduled very far from where she was living or she could not arrange transportation due to no address for pickup. She has been discharged from dialysis centers due to missing appointments.  Patient has two living daughters. Her son is deceased. She is homeless but has been living with her ex (they were together 21 years.) She has also lived in motels. Her husband died from an overdose 27 years ago. She shares that she does not have a very close relationship with her daughters.   A discussion was had today regarding advanced directives. Patient states she does not have a lot of family support but would like to make her daughter [name] her HCPOA. Spiritual care consult placed. Concepts specific to code status, scope of care and rehospitalization was had. Recommended consideration of DNR status, understanding evidenced-based poor outcomes in similar hospitalized patients, as the cause of the  arrest is likely associated with chronic/terminal disease rather than a reversible acute cardio-pulmonary event. Patient confirms she would like to change her code status to DNR. Discussed scopes of care. The difference between a aggressive medical intervention path and a palliative comfort care path for this patient at this time was had. The patient shares that she would like to treat the treatable but not be intubated-- changed to limited scope of treatment. She shares that she cannot imagine doing dialysis three times a week for the rest of her life. She finds that  enjoying as much time as she can between dialysis or hospitalization is working for her lifestyle and quality of life. She does not mind being rehospitalized when needed for "tune ups." We discuss that although this has worked for her over the last months I am concerned that these "tune ups" will become more frequent with time. She says she will reconsider her options when that happens.  Discussed the importance of continued conversation with family and the medical providers regarding overall plan of care and treatment options, ensuring decisions are within the context of the patient's values and GOCs.  Questions and concerns were addressed. Hard Choices booklet left for review. The patient was encouraged to call with questions or concerns. PMT will continue to support holistically.  Review of Systems  Constitutional:  Positive for fatigue.  Respiratory:  Positive for shortness of breath.   Endocrine: Positive for cold intolerance.  Musculoskeletal:  Positive for back pain and myalgias.  Neurological:  Positive for weakness.    Objective:   Primary Diagnoses: Present on Admission:  Essential hypertension  Hepatitis C  Anxiety and depression  Acute on chronic anemia  Alcohol use  Chronic obstructive pulmonary disease (HCC)  Chronic pain syndrome  Hyperlipidemia  Tobacco dependence  Arteriovenous fistula infection, initial encounter [facility])   Physical Exam Vitals reviewed.  Constitutional:      General: She is not in acute distress.    Appearance: She is underweight. She is ill-appearing.  HENT:     Head: Normocephalic and atraumatic.  Cardiovascular:     Rate and Rhythm: Normal rate.  Pulmonary:     Effort: Pulmonary effort is normal.  Neurological:     Mental Status: She is oriented to person, place, and time.  Psychiatric:        Behavior: Behavior normal.    Vital Signs:  BP (!) 173/79 (BP Location: Left Arm)   Pulse 74   Temp 98.3 F (36.8 C) (Oral)   Resp 16    Wt 49.9 kg   SpO2 100%   BMI 17.23 kg/m     Advanced Care Planning:   Existing Vynca/ACP Documentation: None.  Primary Decision Maker: PATIENT. Patient would like to complete HCPOA paperwork naming her daughter [name] as her proxy Management consultant.  Code Status/Advance Care Planning: DNR-Changed today  Assessment & Plan:   SUMMARY OF RECOMMENDATIONS   Changed to DNR Changed to DNI Patient does not want consistent [place] term dialysis Spiritual care consult for HCPOA completion: Daughter [name] as HCPOA Continued PMT support  Discussed with: bedside RN and Dr. Alanda Slim  Time Total: 90 minutes  Thank you for allowing [place] to participate in the care of [facility] [place] PMT will continue to support holistically.   Signed by: Sarina Ser, NP Palliative Medicine Team  Team Phone # [phone] (Nights/Weekends)  04/18/2023, 4:59 PM

## 2023-04-18 NOTE — Progress Notes (Signed)
PROGRESS NOTE  [name] NWG:[id number] DOB: 1960-05-08   PCP: [name], MD  Patient is from: Home.  She states she lives with friend.  DOA: 04/15/2023 LOS: 2  Chief complaints Chief Complaint  Patient presents with   Multiple Complaints     Brief Narrative / Interim history: 63 year old F with PMH of ESRD not on HD for 2 months, COPD, CHF, HTN, HLD, hep C, anemia, anxiety, polysubstance use and noncompliance presenting with myalgia, generalized weakness and subjective fever, and admitted due to concern for right AV fistula infection, anemia and elevated troponin.  In ED, febrile to 100.9.  Slightly tachycardic and tachypneic.  BP elevated.  WBC 7.8.  Hgb 6.6 (baseline 7-8). Cr 5.82.  BUN 82.  Bicarb 10.  AG 17.  CK normal.  Lactic acid negative.  Anemia panel with low iron saturation.  Troponin 131=>137>135.  EKG NSR.  UA with moderate LE, > 300 protein and rare bacteria.  UDS, urine culture and blood culture ordered.  Patient was started on broad-spectrum antibiotics.  Nephrology and vascular surgery consulted.   UDS positive for amphetamine and cocaine.  Blood cultures NGTD.  Evaluated by vascular surgery.  Duplex ordered for AV fistula.  Remains on broad-spectrum antibiotics.  Palliative medicine consulted as well.  Subjective: Seen and examined earlier this morning.  No major events overnight of this morning.  She reports diarrhea.  She said she had 3 watery bowel movements and bowel accidents overnight.  Reports abdominal pain but she attributes to heparin shots.  Continues to endorse myalgia.  She reports some improvement in her AV fistula site infection/cellulitis but still with some tenderness and redness.  Objective: Vitals:   04/17/23 1600 04/17/23 2011 04/18/23 0612 04/18/23 0745  BP: (!) 150/66 (!) 174/73 (!) 149/66 (!) 160/78  Pulse: 73 79 77 72  Resp: 18 20 20 18   Temp: 98.7 F (37.1 C) 98.3 F (36.8 C) 98.2 F (36.8 C) 97.9 F (36.6 C)  TempSrc: Oral  Oral Oral Oral  SpO2: 97% 100% 100% 100%  Weight:        Examination:  GENERAL: No apparent distress.  Nontoxic. HEENT: MMM.  Vision and hearing grossly intact.  NECK: Supple.  No apparent JVD.  RESP:  No IWOB.  Fair aeration bilaterally. CVS:  RRR. Heart sounds normal.  Right arm AVF with bruits.  Seems swelling and erythema has improved ABD/GI/GU: BS+. Abd soft, NTND.  MSK/EXT:  Moves extremities. No apparent deformity. No edema.  Fistula site as above. SKIN: Fistula site as above. NEURO: Awake, alert and oriented appropriately.  No apparent focal neuro deficit. PSYCH: Calm. Normal affect.   Procedures:  None  Microbiology summarized: COVID-19, influenza and RSV PCR nonreactive Blood cultures NGTD Urine culture with multiple species.  Assessment and plan: Sepsis due to right arm cellulitis with possible AV fistula involvement: Patient has fever, tachycardia and tachypnea on presentation.  Has no leukocytosis.  Some swelling, redness and increased warmth to touch at AV fistula site.  UDS positive for cocaine and amphetamine.  Patient denied IV drug use until confronted by results.  aVF Doppler without significant finding.   Blood cultures NGTD -Continue vancomycin -De-escalate Zosyn to ceftriaxone.  Low suspicion for Pseudomonas or anaerobic -Appreciate input by vascular -Counseled on the importance of refraining from drug use.   Acute on chronic anemia: Hgb range Nadear at 6.6.  Baseline Hgb 7-8.  No overt bleeding.  Hemoccult negative.  Anemia panel with low iron saturation.  Not  on anticoagulation or antiplatelet.  Transfused 1 unit with appropriate response. Recent Labs    01/29/23 0239 01/31/23 0758 01/31/23 1120 02/01/23 0845 02/10/23 1344 04/15/23 1616 04/16/23 0953 04/16/23 1433 04/17/23 0454 04/17/23 1531  HGB 7.9* 8.2* 9.1* 7.6* 7.6* 6.6* 6.6* 7.0* 6.6* 7.6*  -IV iron and Aranesp per nephrology -Continue monitoring   Myalgia/chronic pain syndrome: Myalgia  likely due to substance use.  UDS positive for cocaine and amphetamine.  CK within normal.  COVID-19, influenza and RSV PCR nonreactive -Tylenol as needed -Tramadol as needed for moderate pain   ESRD/CKD-5: Reports making urine.  Last HD in November.  Followed by Dr. Ronalee Belts.  -Nephrology on board and recommended palliative consult since patient is not compliant with HD  Anion gap metabolic acidosis: Likely due to azotemia -Increased p.o. sodium bicarbonate on 1/30. -Monitor   Polysubstance use: Patient denied recreational drug use until confronted by UDS result which is positive for cocaine and amphetamine.  She admitted to snorting cocaine but denies IVDU. -Advised to refrain from recreational drugs -Encouraged smoking cessation -Nicotine patch  Chronic COPD: Stable -Nebulizer/inhaler as needed -Encourage smoking cessation   Essential hypertension: BP improved. -Continue Norvasc 10 mg daily  Hypokalemia: Resolved.  Hyperphosphatemia -Binder per nephrology   Hepatitis C -Follow viral load  Anxiety and depression: Stable -Continue home Atarax.  Diarrhea: Likely due to antibiotics and possibly uremia.  Low suspicion for C. difficile. -Imodium as needed  Underweight Body mass index is 17.23 kg/m. -Consult dietitian   DVT prophylaxis:  heparin injection 5,000 Units Start: 04/16/23 0600  Code Status: Full code Family Communication: None at bedside Level of care: Med-Surg Status is: Inpatient Remains inpatient appropriate because: Concern for AV fistula infection, ESRD/CKD and anemia   Final disposition: Likely home Consultants:  Nephrology Vascular surgery Palliative medicine  55 minutes with more than 50% spent in reviewing records, counseling patient/family and coordinating care.   Sch Meds:  Scheduled Meds:  busPIRone  10 mg Oral BID   calcium acetate  1,334 mg Oral TID WC   [START ON 04/23/2023] darbepoetin (ARANESP) injection - NON-DIALYSIS  200 mcg  Subcutaneous Q Wed-1800   fluticasone furoate-vilanterol  1 puff Inhalation Daily   And   umeclidinium bromide  1 puff Inhalation Daily   gabapentin  100 mg Oral TID   heparin  5,000 Units Subcutaneous Q8H   lidocaine  3 patch Transdermal Q24H   multivitamin  1 tablet Oral QHS   nicotine  14 mg Transdermal Daily   pantoprazole  40 mg Oral Daily   sodium bicarbonate  1,300 mg Oral TID   torsemide  100 mg Oral Daily   vancomycin variable dose per unstable renal function (pharmacist dosing)   Does not apply See admin instructions   Continuous Infusions:  piperacillin-tazobactam (ZOSYN)  IV 2.25 g (04/18/23 0617)   vancomycin     PRN Meds:.acetaminophen **OR** acetaminophen, hydrOXYzine, ipratropium-albuterol, loperamide, ondansetron **OR** ondansetron (ZOFRAN) IV, senna-docusate, traMADol, traZODone  Antimicrobials: Anti-infectives (From admission, onward)    Start     Dose/Rate Route Frequency Ordered Stop   04/18/23 1015  vancomycin (VANCOREADY) IVPB 750 mg/150 mL  Status:  Discontinued        750 mg 150 mL/hr over 60 Minutes Intravenous  Once 04/18/23 0915 04/18/23 0928   04/18/23 1015  vancomycin (VANCOCIN) 750 mg in sodium chloride 0.9 % 250 mL IVPB        750 mg 250 mL/hr over 60 Minutes Intravenous  Once 04/18/23 7829  04/16/23 1400  piperacillin-tazobactam (ZOSYN) IVPB 2.25 g        2.25 g 100 mL/hr over 30 Minutes Intravenous Every 8 hours 04/16/23 0531     04/16/23 0535  vancomycin variable dose per unstable renal function (pharmacist dosing)         Does not apply See admin instructions 04/16/23 0535     04/16/23 0300  vancomycin (VANCOCIN) IVPB 1000 mg/200 mL premix        1,000 mg 200 mL/hr over 60 Minutes Intravenous  Once 04/16/23 0249 04/16/23 0735   04/16/23 0300  piperacillin-tazobactam (ZOSYN) IVPB 3.375 g        3.375 g 100 mL/hr over 30 Minutes Intravenous  Once 04/16/23 0249 04/16/23 0604   04/15/23 2115  cefTRIAXone (ROCEPHIN) 2 g in sodium chloride 0.9  % 100 mL IVPB        2 g 200 mL/hr over 30 Minutes Intravenous  Once 04/15/23 2102 04/15/23 2333        I have personally reviewed the following labs and images: CBC: Recent Labs  Lab 04/15/23 1616 04/16/23 0953 04/16/23 1433 04/17/23 0454 04/17/23 1531  WBC 7.8 4.8 5.2 3.5*  --   NEUTROABS 6.6 3.5  --   --   --   HGB 6.6* 6.6* 7.0* 6.6* 7.6*  HCT 19.7* 19.8* 21.0* 18.4* 21.3*  MCV 100.5* 99.5 100.0 95.3  --   PLT 293 211 211 203  --    BMP &GFR Recent Labs  Lab 04/15/23 1616 04/16/23 0952 04/17/23 0454 04/18/23 0552  NA 135 130* 130* 135  K 4.3 3.3* 3.2* 3.7  CL 108 110 109 113*  CO2 10* 10* 11* 12*  GLUCOSE 111* 99 103* 102*  BUN 82* 75* 83* 78*  CREATININE 5.82* 5.82* 6.28* 6.58*  CALCIUM 9.1 8.0* 7.7* 8.0*  MG  --  1.7 1.8  --   PHOS  --  5.4* 6.1* 6.7*   Estimated Creatinine Clearance: 7 mL/min (A) (by C-G formula based on SCr of 6.58 mg/dL (H)). Liver & Pancreas: Recent Labs  Lab 04/16/23 0952 04/17/23 0454 04/18/23 0552  AST 17  --   --   ALT 18  --   --   ALKPHOS 98  --   --   BILITOT 0.5  --   --   PROT 5.9*  --   --   ALBUMIN 2.7* 2.5* 2.6*   No results for input(s): "LIPASE", "AMYLASE" in the last 168 hours. No results for input(s): "AMMONIA" in the last 168 hours. Diabetic: No results for input(s): "HGBA1C" in the last 72 hours. No results for input(s): "GLUCAP" in the last 168 hours. Cardiac Enzymes: Recent Labs  Lab 04/15/23 1616  CKTOTAL 72   No results for input(s): "PROBNP" in the last 8760 hours. Coagulation Profile: Recent Labs  Lab 04/16/23 0952  INR 1.2   Thyroid Function Tests: No results for input(s): "TSH", "T4TOTAL", "FREET4", "T3FREE", "THYROIDAB" in the last 72 hours. Lipid Profile: No results for input(s): "CHOL", "HDL", "LDLCALC", "TRIG", "CHOLHDL", "LDLDIRECT" in the last 72 hours. Anemia Panel: Recent Labs    04/15/23 1900  VITAMINB12 387  FOLATE >40.0  FERRITIN 269  TIBC 297  IRON 24*  RETICCTPCT  2.9   Urine analysis:    Component Value Date/Time   COLORURINE YELLOW 04/16/2023 0200   APPEARANCEUR HAZY (A) 04/16/2023 0200   LABSPEC 1.009 04/16/2023 0200   PHURINE 6.0 04/16/2023 0200   GLUCOSEU 50 (A) 04/16/2023 0200   HGBUR  SMALL (A) 04/16/2023 0200   BILIRUBINUR NEGATIVE 04/16/2023 0200   KETONESUR NEGATIVE 04/16/2023 0200   PROTEINUR >=300 (A) 04/16/2023 0200   UROBILINOGEN 0.2 03/02/2010 2027   NITRITE NEGATIVE 04/16/2023 0200   LEUKOCYTESUR MODERATE (A) 04/16/2023 0200   Sepsis Labs: Invalid input(s): "PROCALCITONIN", "LACTICIDVEN"  Microbiology: Recent Results (from the past 240 hours)  Resp panel by RT-PCR (RSV, Flu A&B, Covid) Anterior Nasal Swab     Status: None   Collection Time: 04/15/23  3:56 PM   Specimen: Anterior Nasal Swab  Result Value Ref Range Status   SARS Coronavirus 2 by RT PCR NEGATIVE NEGATIVE Final   Influenza A by PCR NEGATIVE NEGATIVE Final   Influenza B by PCR NEGATIVE NEGATIVE Final    Comment: (NOTE) The Xpert Xpress SARS-CoV-2/FLU/RSV plus assay is intended as an aid in the diagnosis of influenza from Nasopharyngeal swab specimens and should not be used as a sole basis for treatment. Nasal washings and aspirates are unacceptable for Xpert Xpress SARS-CoV-2/FLU/RSV testing.  Fact Sheet for Patients: BloggerCourse.com  Fact Sheet for Healthcare Providers: SeriousBroker.it  This test is not yet approved or cleared by the [place] FDA and has been authorized for detection and/or diagnosis of SARS-CoV-2 by FDA under an Emergency Use Authorization (EUA). This EUA will remain in effect (meaning this test can be used) for the duration of the COVID-19 declaration under Section 564(b)(1) of the Act, 21 U.S.C. section 360bbb-3(b)(1), unless the authorization is terminated or revoked.     Resp Syncytial Virus by PCR NEGATIVE NEGATIVE Final    Comment: (NOTE) Fact Sheet for  Patients: BloggerCourse.com  Fact Sheet for Healthcare Providers: SeriousBroker.it  This test is not yet approved or cleared by the [place] FDA and has been authorized for detection and/or diagnosis of SARS-CoV-2 by FDA under an Emergency Use Authorization (EUA). This EUA will remain in effect (meaning this test can be used) for the duration of the COVID-19 declaration under Section 564(b)(1) of the Act, 21 U.S.C. section 360bbb-3(b)(1), unless the authorization is terminated or revoked.  Performed at [facility], [address] [place], [place] [address]   Blood culture (routine x 2)     Status: None (Preliminary result)   Collection Time: 04/15/23  6:58 PM   Specimen: BLOOD LEFT ARM  Result Value Ref Range Status   Specimen Description BLOOD LEFT ARM  Final   Special Requests   Final    BOTTLES DRAWN AEROBIC AND ANAEROBIC Blood Culture results may not be optimal due to an inadequate volume of blood received in culture bottles   Culture   Final    NO GROWTH 3 DAYS Performed at [facility], [address], [place], [place] [address]    Report Status PENDING  Incomplete  Blood culture (routine x 2)     Status: None (Preliminary result)   Collection Time: 04/15/23  6:58 PM   Specimen: BLOOD LEFT ARM  Result Value Ref Range Status   Specimen Description BLOOD LEFT ARM  Final   Special Requests   Final    AEROBIC BOTTLE ONLY Blood Culture results may not be optimal due to an inadequate volume of blood received in culture bottles   Culture   Final    NO GROWTH 3 DAYS Performed at [facility], [address] [place], [place] [address]    Report Status PENDING  Incomplete  Urine Culture (for pregnant, neutropenic or urologic patients or patients with an indwelling urinary  catheter)     Status: Abnormal   Collection Time: 04/16/23  2:00 AM   Specimen: In/Out Cath Urine  Result Value Ref Range Status    Specimen Description IN/OUT CATH URINE  Final   Special Requests   Final    NONE Performed at [facility], [address] [place], [place] [address]    Culture MULTIPLE SPECIES PRESENT, SUGGEST RECOLLECTION (A)  Final   Report Status 04/17/2023 FINAL  Final    Radiology Studies: No results found.     Fowler Antos T. Hendrixx [facility]  If 7PM-7AM, please contact night-coverage [email or web address] 04/18/2023, 12:08 PM

## 2023-04-18 NOTE — Progress Notes (Signed)
Initial Nutrition Assessment  DOCUMENTATION CODES:   Severe malnutrition in context of social or environmental circumstances  INTERVENTION:  Liberalize diet to regular to encourage increased intake Nepro Shake po BID, each supplement provides 425 kcal and 19 grams protein  Continue renal MVI  NUTRITION DIAGNOSIS:  Severe Malnutrition related to social / environmental circumstances as evidenced by severe muscle depletion, severe fat depletion, percent weight loss.  GOAL:  Patient will meet greater than or equal to 90% of their needs  MONITOR:  PO intake, Supplement acceptance, Weight trends  REASON FOR ASSESSMENT:  Consult Assessment of nutrition requirement/status  ASSESSMENT:   63 y.o female presenting with myalgia, generalized weakness, and fever. Concerning for AVF infection, anemia, and elevated troponin.  PMH: ESRD off iHD for 2 months, COPD, CHF, HTN, HLD, hep C, anemia, anxiety, polysubstance abuse. USD positive for amphetamine and cocaine.  Nephrology recommending palliative consult given her noncompliance with HD. Did encourage regular dialysis if she was to remain on liberalized diet [place] term.   Endorses no change to appetite and eating "anything she can get her hands on." Usually able to consume two meals per day (breakfast and supper). Recommend liberalizing diet d/t malnutrition and no urgent indication for HD in presence dietary non adherence along with cessation of txs >2 months. Calcium acetate ordered.   Averaging 80% meal intake x2 documented meals. Endorses no recent decline in appetite nor issues with chewing or swallowing, despite being endentulous.   24 Hour Recall B: egg mcmuffin L: skips D: protein, starch, vegetable or fast food meal Snacks: cookies or chips  UBW reported as 140s w/ current weight 110lbs. Chart review showing wt range between 99-130lbs over last year. Was around 130 lbs in June 2024, endorsing 15% wt loss in 6 months.  Admit Weight:  49.9kg  No edema noted. Pt reports adequate UOP, however documentation inconsistent. Does endorse multiple loose stools in last 24-48 hours. Low concern for C.diff, however on contact precautions. Na+ and K+ low on admission. Repletion ordered and labs stabilized.  Labs: Na+ 130>135 K+ 3.2>3.7 CBGs 102-103 over 48 hours A1c 4.6 (10/2022) Crt 5.82>6.28>6.58 Hgb 7.0>6.6>7.6  Meds: calcium acetate, darbepoetin alfa, renal MVI, pantoprazole, sodium bicarbonate, torsemide, vancomycin. Ceftriaxone     NUTRITION - FOCUSED PHYSICAL EXAM:  Flowsheet Row Most Recent Value  Orbital Region Moderate depletion  Upper Arm Region Severe depletion  Thoracic and Lumbar Region Severe depletion  Buccal Region Severe depletion  Temple Region Moderate depletion  Clavicle Bone Region Severe depletion  Clavicle and Acromion Bone Region Severe depletion  Scapular Bone Region Severe depletion  Dorsal Hand Moderate depletion  Patellar Region Severe depletion  Anterior Thigh Region Severe depletion  Posterior Calf Region Severe depletion  Edema (RD Assessment) None  Hair Reviewed  Eyes Reviewed  Mouth Reviewed  [endentulous]  Skin Reviewed  Nails Reviewed    Diet Order:   Diet Order             Diet renal with fluid restriction Fluid restriction: 1200 mL Fluid; Room service appropriate? Yes; Fluid consistency: Thin  Diet effective now             EDUCATION NEEDS:  No education needs have been identified at this time  Skin:  Skin Assessment: Reviewed RN Assessment  Last BM:  1/31 - multiple, type 6 and 7  Height:  Ht Readings from Last 1 Encounters:  02/10/23 5\' 7"  (1.702 m)    Weight:  Wt Readings from Last 1 Encounters:  04/15/23 49.9 kg    Ideal Body Weight:  61.4 kg  BMI:  Body mass index is 17.23 kg/m.  Estimated Nutritional Needs:   Kcal:  1600-1800kcal  Protein:  75-85g  Fluid:  1.6-1.8L/day  Myrtie Cruise MS, RD, LDN Registered Dietitian Clinical  Nutrition RD Inpatient Contact Info in Amion

## 2023-04-19 DIAGNOSIS — T827XXA Infection and inflammatory reaction due to other cardiac and vascular devices, implants and grafts, initial encounter: Secondary | ICD-10-CM | POA: Diagnosis not present

## 2023-04-19 DIAGNOSIS — D649 Anemia, unspecified: Secondary | ICD-10-CM | POA: Diagnosis not present

## 2023-04-19 DIAGNOSIS — G894 Chronic pain syndrome: Secondary | ICD-10-CM | POA: Diagnosis not present

## 2023-04-19 DIAGNOSIS — Z515 Encounter for palliative care: Secondary | ICD-10-CM | POA: Diagnosis not present

## 2023-04-19 DIAGNOSIS — Z7189 Other specified counseling: Secondary | ICD-10-CM | POA: Diagnosis not present

## 2023-04-19 DIAGNOSIS — N186 End stage renal disease: Secondary | ICD-10-CM | POA: Diagnosis not present

## 2023-04-19 LAB — CBC
HCT: 22.8 % — ABNORMAL LOW (ref 36.0–46.0)
Hemoglobin: 7.8 g/dL — ABNORMAL LOW (ref 12.0–15.0)
MCH: 32.1 pg (ref 26.0–34.0)
MCHC: 34.2 g/dL (ref 30.0–36.0)
MCV: 93.8 fL (ref 80.0–100.0)
Platelets: 227 10*3/uL (ref 150–400)
RBC: 2.43 MIL/uL — ABNORMAL LOW (ref 3.87–5.11)
RDW: 16.9 % — ABNORMAL HIGH (ref 11.5–15.5)
WBC: 5.1 10*3/uL (ref 4.0–10.5)
nRBC: 1 % — ABNORMAL HIGH (ref 0.0–0.2)

## 2023-04-19 LAB — RENAL FUNCTION PANEL
Albumin: 2.4 g/dL — ABNORMAL LOW (ref 3.5–5.0)
Anion gap: 15 (ref 5–15)
BUN: 75 mg/dL — ABNORMAL HIGH (ref 8–23)
CO2: 13 mmol/L — ABNORMAL LOW (ref 22–32)
Calcium: 8.2 mg/dL — ABNORMAL LOW (ref 8.9–10.3)
Chloride: 110 mmol/L (ref 98–111)
Creatinine, Ser: 6.46 mg/dL — ABNORMAL HIGH (ref 0.44–1.00)
GFR, Estimated: 7 mL/min — ABNORMAL LOW (ref >60)
Glucose, Bld: 101 mg/dL — ABNORMAL HIGH (ref 70–99)
Phosphorus: 5.4 mg/dL — ABNORMAL HIGH (ref 2.5–4.6)
Potassium: 3.4 mmol/L — ABNORMAL LOW (ref 3.5–5.1)
Sodium: 138 mmol/L (ref 135–145)

## 2023-04-19 LAB — MAGNESIUM: Magnesium: 1.6 mg/dL — ABNORMAL LOW (ref 1.7–2.4)

## 2023-04-19 NOTE — Progress Notes (Signed)
Mobility Specialist Progress Note:   04/19/23 1601  Mobility  Activity Ambulated with assistance in hallway  Level of Assistance Contact guard assist, steadying assist  Assistive Device None  Distance Ambulated (ft) 150 ft  Activity Response Tolerated well  Mobility Referral Yes  Mobility visit 1 Mobility  Mobility Specialist Start Time (ACUTE ONLY) 1545  Mobility Specialist Stop Time (ACUTE ONLY) 1555  Mobility Specialist Time Calculation (min) (ACUTE ONLY) 10 min   Pt received in bed, agreeable to mobility. SB bed mobility. CG to ambulate in hallway. Pt c/o leg weakness towards EOS. Displayed slight unsteady gait requiring CG but no LOB present. Pt denied any other discomfort, asx throughout. Pt returned to bed with call bell in reach and all needs met.   Leory Plowman  Mobility Specialist Please contact via [employer] office at [phone]

## 2023-04-19 NOTE — Plan of Care (Signed)
  Problem: Health Behavior/Discharge Planning: Goal: Ability to manage health-related needs will improve Outcome: Progressing   Problem: Education: Goal: Knowledge of General Education information will improve Description: Including pain rating scale, medication(s)/side effects and non-pharmacologic comfort measures Outcome: Progressing   Problem: Clinical Measurements: Goal: Diagnostic test results will improve Outcome: Progressing   Problem: Activity: Goal: Risk for activity intolerance will decrease Outcome: Progressing   Problem: Nutrition: Goal: Adequate nutrition will be maintained Outcome: Progressing   Problem: Skin Integrity: Goal: Risk for impaired skin integrity will decrease Outcome: Progressing

## 2023-04-19 NOTE — Progress Notes (Signed)
PROGRESS NOTE  [name] Auth VHQ:[id number] DOB: 04/02/1960   PCP: [name], MD  Patient is from: Home.  She states she lives with friend.  DOA: 04/15/2023 LOS: 3  Chief complaints Chief Complaint  Patient presents with   Multiple Complaints     Brief Narrative / Interim history: 63 year old F with PMH of ESRD not on HD for 2 months, COPD, CHF, HTN, HLD, hep C, anemia, anxiety, polysubstance use and noncompliance presenting with myalgia, generalized weakness and subjective fever, and admitted due to concern for right AV fistula infection, anemia and elevated troponin.  In ED, febrile to 100.9.  Slightly tachycardic and tachypneic.  BP elevated.  WBC 7.8.  Hgb 6.6 (baseline 7-8). Cr 5.82.  BUN 82.  Bicarb 10.  AG 17.  CK normal.  Lactic acid negative.  Anemia panel with low iron saturation.  Troponin 131=>137>135.  EKG NSR.  UA with moderate LE, > 300 protein and rare bacteria.  UDS, urine culture and blood culture ordered.  Patient was started on broad-spectrum antibiotics.  Nephrology and vascular surgery consulted.   UDS positive for amphetamine and cocaine.  Blood cultures NGTD.  Evaluated by vascular surgery.  AV fistula duplex without significant finding.  Remains on broad-spectrum antibiotics.    Patient is not compliant with HD and not interested in regular outpatient HD.  Palliative medicine consulted.  CODE STATUS changed to DNR/DNI.  Subjective: Seen and examined earlier this morning.  No major events overnight of this morning.  Reports improvement in right arm pain, swelling and redness.  No further diarrhea either.  Denies chest pain, shortness of breath, GI or UTI symptoms.  Reports making good amount of urine.  Objective: Vitals:   04/18/23 1300 04/18/23 2149 04/19/23 0624 04/19/23 0714  BP: (!) 173/79 (!) 176/71 (!) 156/68 (!) 153/93  Pulse: 74 78 81 80  Resp: 16 16    Temp: 98.3 F (36.8 C) 98.2 F (36.8 C) 98.2 F (36.8 C) 98.1 F (36.7 C)  TempSrc:  Oral Oral Oral Oral  SpO2: 100% 99% 99% 100%  Weight:        Examination:  GENERAL: No apparent distress.  Nontoxic. HEENT: MMM.  Vision and hearing grossly intact.  NECK: Supple.  No apparent JVD.  RESP:  No IWOB.  Fair aeration bilaterally. CVS:  RRR. Heart sounds normal.  Right arm AVF with bruits.  Improved swelling, erythema and tenderness. ABD/GI/GU: BS+. Abd soft, NTND.  MSK/EXT:  Moves extremities. No apparent deformity. No edema.  Fistula site as above. SKIN: Fistula site as above. NEURO: Awake, alert and oriented appropriately.  No apparent focal neuro deficit. PSYCH: Calm. Normal affect.   Procedures:  None  Microbiology summarized: COVID-19, influenza and RSV PCR nonreactive Blood cultures NGTD Urine culture with multiple species.  Assessment and plan: Sepsis due to right arm cellulitis with possible AV fistula involvement: Patient has fever, tachycardia and tachypnea on presentation.  Has no leukocytosis.  UDS positive for cocaine and amphetamine.  Patient denied IV drug use until confronted by results.  aVF Doppler without significant finding.   Blood cultures NGTD.  Cellulitis improving. -Continue vancomycin -De-escalated Zosyn to ceftriaxone on 1/31.  Low suspicion for Pseudomonas or anaerobic -Appreciate input by vascular -Counseled on the importance of refraining from drug use.   Acute on chronic anemia: Hgb dropped to 6.6.  Baseline Hgb 7-8.  No overt bleeding.  Hemoccult negative.  Anemia panel with low iron saturation.  Not on anticoagulation or antiplatelet.  Transfused  2 units so far.  H&H stable now Recent Labs    01/31/23 0758 01/31/23 1120 02/01/23 0845 02/10/23 1344 04/15/23 1616 04/16/23 0953 04/16/23 1433 04/17/23 0454 04/17/23 1531 04/19/23 0437  HGB 8.2* 9.1* 7.6* 7.6* 6.6* 6.6* 7.0* 6.6* 7.6* 7.8*  -IV iron and Aranesp per nephrology -Continue monitoring   Myalgia/chronic pain syndrome: Myalgia likely due to substance use.  UDS  positive for cocaine and amphetamine.  CK within normal.  COVID-19, influenza and RSV PCR nonreactive.  Improved. -Tylenol as needed -Tramadol as needed for moderate pain   ESRD/CKD-5: Reports making urine.  Last HD in November.  Followed by Dr. Ronalee Belts.  -Nephrology on board and recommended palliative consult since patient is not compliant with HD  Anion gap metabolic acidosis: Likely due to azotemia -Increased p.o. sodium bicarbonate on 1/30. -Monitor   Polysubstance use: Patient denied recreational drug use until confronted by UDS result which is positive for cocaine and amphetamine.  She admitted to snorting cocaine but denies IVDU. -Advised to refrain from recreational drugs -Encouraged smoking cessation -Nicotine patch  Chronic COPD: Stable -Nebulizer/inhaler as needed -Encourage smoking cessation   Essential hypertension: BP improved. -Continue Norvasc 10 mg daily  Hypokalemia: Resolved.  Hyperphosphatemia -Binder per nephrology   Hepatitis C -Follow viral load  Anxiety and depression: Stable -Continue home Atarax.  Diarrhea: Likely due to antibiotics and possibly uremia.  Low suspicion for C. difficile.  Resolved. -Continue Imodium as needed.  Underweight Body mass index is 17.23 kg/m. -Consult dietitian   DVT prophylaxis:  heparin injection 5,000 Units Start: 04/16/23 0600  Code Status: DNR/DNI. Family Communication: None at bedside Level of care: Med-Surg Status is: Inpatient Remains inpatient appropriate because: Concern for AV fistula infection, ESRD/CKD and anemia   Final disposition: Likely home Consultants:  Nephrology Vascular surgery Palliative medicine  55 minutes with more than 50% spent in reviewing records, counseling patient/family and coordinating care.   Sch Meds:  Scheduled Meds:  busPIRone  10 mg Oral BID   calcium acetate  1,334 mg Oral TID WC   [START ON 04/23/2023] darbepoetin (ARANESP) injection - NON-DIALYSIS  200 mcg  Subcutaneous Q Wed-1800   feeding supplement (NEPRO CARB STEADY)  237 mL Oral BID BM   fluticasone furoate-vilanterol  1 puff Inhalation Daily   And   umeclidinium bromide  1 puff Inhalation Daily   gabapentin  100 mg Oral TID   heparin  5,000 Units Subcutaneous Q8H   lidocaine  3 patch Transdermal Q24H   multivitamin  1 tablet Oral QHS   nicotine  14 mg Transdermal Daily   pantoprazole  40 mg Oral Daily   sodium bicarbonate  1,300 mg Oral TID   torsemide  100 mg Oral Daily   vancomycin variable dose per unstable renal function (pharmacist dosing)   Does not apply See admin instructions   Continuous Infusions:  cefTRIAXone (ROCEPHIN)  IV 1 g (04/18/23 1708)   PRN Meds:.acetaminophen **OR** acetaminophen, hydrOXYzine, ipratropium-albuterol, loperamide, ondansetron **OR** ondansetron (ZOFRAN) IV, senna-docusate, traMADol, traZODone  Antimicrobials: Anti-infectives (From admission, onward)    Start     Dose/Rate Route Frequency Ordered Stop   04/18/23 1400  cefTRIAXone (ROCEPHIN) 1 g in sodium chloride 0.9 % 100 mL IVPB        1 g 200 mL/hr over 30 Minutes Intravenous Every 24 hours 04/18/23 1211     04/18/23 1015  vancomycin (VANCOREADY) IVPB 750 mg/150 mL  Status:  Discontinued        750 mg 150 mL/hr  over 60 Minutes Intravenous  Once 04/18/23 0915 04/18/23 0928   04/18/23 1015  vancomycin (VANCOCIN) 750 mg in sodium chloride 0.9 % 250 mL IVPB        750 mg 250 mL/hr over 60 Minutes Intravenous  Once 04/18/23 0928 04/18/23 1807   04/16/23 1400  piperacillin-tazobactam (ZOSYN) IVPB 2.25 g  Status:  Discontinued        2.25 g 100 mL/hr over 30 Minutes Intravenous Every 8 hours 04/16/23 0531 04/18/23 1211   04/16/23 0535  vancomycin variable dose per unstable renal function (pharmacist dosing)         Does not apply See admin instructions 04/16/23 0535     04/16/23 0300  vancomycin (VANCOCIN) IVPB 1000 mg/200 mL premix        1,000 mg 200 mL/hr over 60 Minutes Intravenous  Once  04/16/23 0249 04/16/23 0735   04/16/23 0300  piperacillin-tazobactam (ZOSYN) IVPB 3.375 g        3.375 g 100 mL/hr over 30 Minutes Intravenous  Once 04/16/23 0249 04/16/23 0604   04/15/23 2115  cefTRIAXone (ROCEPHIN) 2 g in sodium chloride 0.9 % 100 mL IVPB        2 g 200 mL/hr over 30 Minutes Intravenous  Once 04/15/23 2102 04/15/23 2333        I have personally reviewed the following labs and images: CBC: Recent Labs  Lab 04/15/23 1616 04/16/23 0953 04/16/23 1433 04/17/23 0454 04/17/23 1531 04/19/23 0437  WBC 7.8 4.8 5.2 3.5*  --  5.1  NEUTROABS 6.6 3.5  --   --   --   --   HGB 6.6* 6.6* 7.0* 6.6* 7.6* 7.8*  HCT 19.7* 19.8* 21.0* 18.4* 21.3* 22.8*  MCV 100.5* 99.5 100.0 95.3  --  93.8  PLT 293 211 211 203  --  227   BMP &GFR Recent Labs  Lab 04/15/23 1616 04/16/23 0952 04/17/23 0454 04/18/23 0552 04/19/23 0437  NA 135 130* 130* 135 138  K 4.3 3.3* 3.2* 3.7 3.4*  CL 108 110 109 113* 110  CO2 10* 10* 11* 12* 13*  GLUCOSE 111* 99 103* 102* 101*  BUN 82* 75* 83* 78* 75*  CREATININE 5.82* 5.82* 6.28* 6.58* 6.46*  CALCIUM 9.1 8.0* 7.7* 8.0* 8.2*  MG  --  1.7 1.8  --  1.6*  PHOS  --  5.4* 6.1* 6.7* 5.4*   Estimated Creatinine Clearance: 7.1 mL/min (A) (by C-G formula based on SCr of 6.46 mg/dL (H)). Liver & Pancreas: Recent Labs  Lab 04/16/23 1610 04/17/23 0454 04/18/23 0552 04/19/23 0437  AST 17  --   --   --   ALT 18  --   --   --   ALKPHOS 98  --   --   --   BILITOT 0.5  --   --   --   PROT 5.9*  --   --   --   ALBUMIN 2.7* 2.5* 2.6* 2.4*   No results for input(s): "LIPASE", "AMYLASE" in the last 168 hours. No results for input(s): "AMMONIA" in the last 168 hours. Diabetic: No results for input(s): "HGBA1C" in the last 72 hours. No results for input(s): "GLUCAP" in the last 168 hours. Cardiac Enzymes: Recent Labs  Lab 04/15/23 1616  CKTOTAL 72   No results for input(s): "PROBNP" in the last 8760 hours. Coagulation Profile: Recent Labs  Lab  04/16/23 0952  INR 1.2   Thyroid Function Tests: No results for input(s): "TSH", "T4TOTAL", "FREET4", "T3FREE", "THYROIDAB"  in the last 72 hours. Lipid Profile: No results for input(s): "CHOL", "HDL", "LDLCALC", "TRIG", "CHOLHDL", "LDLDIRECT" in the last 72 hours. Anemia Panel: No results for input(s): "VITAMINB12", "FOLATE", "FERRITIN", "TIBC", "IRON", "RETICCTPCT" in the last 72 hours.  Urine analysis:    Component Value Date/Time   COLORURINE YELLOW 04/16/2023 0200   APPEARANCEUR HAZY (A) 04/16/2023 0200   LABSPEC 1.009 04/16/2023 0200   PHURINE 6.0 04/16/2023 0200   GLUCOSEU 50 (A) 04/16/2023 0200   HGBUR SMALL (A) 04/16/2023 0200   BILIRUBINUR NEGATIVE 04/16/2023 0200   KETONESUR NEGATIVE 04/16/2023 0200   PROTEINUR >=300 (A) 04/16/2023 0200   UROBILINOGEN 0.2 03/02/2010 2027   NITRITE NEGATIVE 04/16/2023 0200   LEUKOCYTESUR MODERATE (A) 04/16/2023 0200   Sepsis Labs: Invalid input(s): "PROCALCITONIN", "LACTICIDVEN"  Microbiology: Recent Results (from the past 240 hours)  Resp panel by RT-PCR (RSV, Flu A&B, Covid) Anterior Nasal Swab     Status: None   Collection Time: 04/15/23  3:56 PM   Specimen: Anterior Nasal Swab  Result Value Ref Range Status   SARS Coronavirus 2 by RT PCR NEGATIVE NEGATIVE Final   Influenza A by PCR NEGATIVE NEGATIVE Final   Influenza B by PCR NEGATIVE NEGATIVE Final    Comment: (NOTE) The Xpert Xpress SARS-CoV-2/FLU/RSV plus assay is intended as an aid in the diagnosis of influenza from Nasopharyngeal swab specimens and should not be used as a sole basis for treatment. Nasal washings and aspirates are unacceptable for Xpert Xpress SARS-CoV-2/FLU/RSV testing.  Fact Sheet for Patients: BloggerCourse.com  Fact Sheet for Healthcare Providers: SeriousBroker.it  This test is not yet approved or cleared by the [place] FDA and has been authorized for detection and/or diagnosis of  SARS-CoV-2 by FDA under an Emergency Use Authorization (EUA). This EUA will remain in effect (meaning this test can be used) for the duration of the COVID-19 declaration under Section 564(b)(1) of the Act, 21 U.S.C. section 360bbb-3(b)(1), unless the authorization is terminated or revoked.     Resp Syncytial Virus by PCR NEGATIVE NEGATIVE Final    Comment: (NOTE) Fact Sheet for Patients: BloggerCourse.com  Fact Sheet for Healthcare Providers: SeriousBroker.it  This test is not yet approved or cleared by the [place] FDA and has been authorized for detection and/or diagnosis of SARS-CoV-2 by FDA under an Emergency Use Authorization (EUA). This EUA will remain in effect (meaning this test can be used) for the duration of the COVID-19 declaration under Section 564(b)(1) of the Act, 21 U.S.C. section 360bbb-3(b)(1), unless the authorization is terminated or revoked.  Performed at [facility], [address] [place], [place] [address]   Blood culture (routine x 2)     Status: None (Preliminary result)   Collection Time: 04/15/23  6:58 PM   Specimen: BLOOD LEFT ARM  Result Value Ref Range Status   Specimen Description BLOOD LEFT ARM  Final   Special Requests   Final    BOTTLES DRAWN AEROBIC AND ANAEROBIC Blood Culture results may not be optimal due to an inadequate volume of blood received in culture bottles   Culture   Final    NO GROWTH 4 DAYS Performed at [facility], [address], [place], [place] [address]    Report Status PENDING  Incomplete  Blood culture (routine x 2)     Status: None (Preliminary result)   Collection Time: 04/15/23  6:58 PM   Specimen: BLOOD LEFT ARM  Result Value Ref Range Status   Specimen Description BLOOD LEFT  ARM  Final   Special Requests   Final    AEROBIC BOTTLE ONLY Blood Culture results may not be optimal due to an inadequate volume of blood received in culture bottles    Culture   Final    NO GROWTH 4 DAYS Performed at [facility], [address], [place], [place] [address]    Report Status PENDING  Incomplete  Urine Culture (for pregnant, neutropenic or urologic patients or patients with an indwelling urinary catheter)     Status: Abnormal   Collection Time: 04/16/23  2:00 AM   Specimen: In/Out Cath Urine  Result Value Ref Range Status   Specimen Description IN/OUT CATH URINE  Final   Special Requests   Final    NONE Performed at [facility], [address], [place], [place] [address]    Culture MULTIPLE SPECIES PRESENT, SUGGEST RECOLLECTION (A)  Final   Report Status 04/17/2023 FINAL  Final    Radiology Studies: No results found.     Ranen Doolin T. Lesle Faron [facility]  If 7PM-7AM, please contact night-coverage [email or web address] 04/19/2023, 1:57 PM
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
## 2023-04-19 NOTE — Progress Notes (Signed)
Patient ID: [name], female   DOB: 04-24-1960, 63 y.o.   MRN: [id number]  S: Patient it appears has had  some very productive discussions with palliative care-   is now a DNR and is not wanting to consider [place] term dialysis  O:BP (!) 176/71 (BP Location: Right Arm)   Pulse 78   Temp 98 F (36.7 C) (Oral)   Resp 16   Wt 49.9 kg   SpO2 99%   BMI 17.23 kg/m   Intake/Output Summary (Last 24 hours) at 04/19/2023 1431 Last data filed at 04/18/2023 1807 Gross per 24 hour  Intake 575.14 ml  Output --  Net 575.14 ml   Intake/Output: I/O last 3 completed shifts: In: 1851.9 [P.O.:1200; IV Piggyback:651.9] Out: -   Intake/Output this shift:  No intake/output data recorded. Weight change:  Gen: NAD CVS: RRR Resp:CTA Abd: +BS, soft, NT/ND Ext: +erythema and induration of right antecubital area but better, RUE AVF +T/B  Recent Labs  Lab 04/15/23 1616 04/16/23 0952 04/17/23 0454 04/18/23 0552 04/19/23 0437  NA 135 130* 130* 135 138  K 4.3 3.3* 3.2* 3.7 3.4*  CL 108 110 109 113* 110  CO2 10* 10* 11* 12* 13*  GLUCOSE 111* 99 103* 102* 101*  BUN 82* 75* 83* 78* 75*  CREATININE 5.82* 5.82* 6.28* 6.58* 6.46*  ALBUMIN  --  2.7* 2.5* 2.6* 2.4*  CALCIUM 9.1 8.0* 7.7* 8.0* 8.2*  PHOS  --  5.4* 6.1* 6.7* 5.4*  AST  --  17  --   --   --   ALT  --  18  --   --   --    Liver Function Tests: Recent Labs  Lab 04/16/23 0952 04/17/23 0454 04/18/23 0552 04/19/23 0437  AST 17  --   --   --   ALT 18  --   --   --   ALKPHOS 98  --   --   --   BILITOT 0.5  --   --   --   PROT 5.9*  --   --   --   ALBUMIN 2.7* 2.5* 2.6* 2.4*   No results for input(s): "LIPASE", "AMYLASE" in the last 168 hours. No results for input(s): "AMMONIA" in the last 168 hours. CBC: Recent Labs  Lab 04/15/23 1616 04/16/23 0953 04/16/23 1433 04/17/23 0454 04/17/23 1531 04/19/23 0437  WBC 7.8 4.8 5.2 3.5*  --  5.1  NEUTROABS 6.6 3.5  --   --   --   --   HGB 6.6* 6.6* 7.0* 6.6* 7.6* 7.8*  HCT 19.7*  19.8* 21.0* 18.4* 21.3* 22.8*  MCV 100.5* 99.5 100.0 95.3  --  93.8  PLT 293 211 211 203  --  227   Cardiac Enzymes: Recent Labs  Lab 04/15/23 1616  CKTOTAL 72   CBG: No results for input(s): "GLUCAP" in the last 168 hours.  Iron Studies:  No results for input(s): "IRON", "TIBC", "TRANSFERRIN", "FERRITIN" in the last 72 hours.  Studies/Results: No results found.   busPIRone  10 mg Oral BID   calcium acetate  1,334 mg Oral TID WC   [START ON 04/23/2023] darbepoetin (ARANESP) injection - NON-DIALYSIS  200 mcg Subcutaneous Q Wed-1800   feeding supplement (NEPRO CARB STEADY)  237 mL Oral BID BM   fluticasone furoate-vilanterol  1 puff Inhalation Daily   And   umeclidinium bromide  1 puff Inhalation Daily   gabapentin  100 mg Oral TID   heparin  5,000 Units  Subcutaneous Q8H   lidocaine  3 patch Transdermal Q24H   multivitamin  1 tablet Oral QHS   nicotine  14 mg Transdermal Daily   pantoprazole  40 mg Oral Daily   sodium bicarbonate  1,300 mg Oral TID   torsemide  100 mg Oral Daily   vancomycin variable dose per unstable renal function (pharmacist dosing)   Does not apply See admin instructions    BMET    Component Value Date/Time   NA 138 04/19/2023 0437   NA 137 06/06/2022 1410   K 3.4 (L) 04/19/2023 0437   CL 110 04/19/2023 0437   CO2 13 (L) 04/19/2023 0437   GLUCOSE 101 (H) 04/19/2023 0437   BUN 75 (H) 04/19/2023 0437   BUN 37 (H) 06/06/2022 1410   CREATININE 6.46 (H) 04/19/2023 0437   CREATININE 1.64 (H) 04/29/2016 1047   CALCIUM 8.2 (L) 04/19/2023 0437   CALCIUM 7.9 (L) 08/15/2022 0104   GFRNONAA 7 (L) 04/19/2023 0437   GFRNONAA 35 (L) 04/29/2016 1047   GFRAA 36 (L) 08/31/2019 1230   GFRAA 40 (L) 04/29/2016 1047   CBC    Component Value Date/Time   WBC 5.1 04/19/2023 0437   RBC 2.43 (L) 04/19/2023 0437   HGB 7.8 (L) 04/19/2023 0437   HGB 6.0 (LL) 06/06/2022 1410   HCT 22.8 (L) 04/19/2023 0437   HCT 18.1 (L) 06/06/2022 1410   PLT 227 04/19/2023 0437    PLT 353 06/06/2022 1410   MCV 93.8 04/19/2023 0437   MCV 96 06/06/2022 1410   MCH 32.1 04/19/2023 0437   MCHC 34.2 04/19/2023 0437   RDW 16.9 (H) 04/19/2023 0437   RDW 13.6 06/06/2022 1410   LYMPHSABS 1.2 04/16/2023 0953   LYMPHSABS 1.3 06/06/2022 1410   MONOABS 0.1 04/16/2023 0953   EOSABS 0.0 04/16/2023 0953   EOSABS 0.4 06/06/2022 1410   BASOSABS 0.0 04/16/2023 0953   BASOSABS 0.0 06/06/2022 1410    Assessment/Plan:  Cellulitis of RUE - seen by Dr. Randie Heinz and agree with IV antibiotics.  Currently on rocephin and vancomycin with improvement of erythema and induration.   Acute on chronic anemia of CKD stage V - will give IV iron and resume ESA and follow.  Transfuse for hgb <7. CKD stage V - no urgent indication to resume HD and would not want to use her AVF for now given infection.  Would not want to place an HD catheter either given her noncompliance with HD.  Need palliative care to discuss pt about not resuming HD given her multiple admissions due to noncompliance.   I asked her on 1/31 if she wanted to resume-  she is crying-  I dont think she wants to be on chronic HD " I dont want that to be my life"  she seems content with her arrangement of missing for months, then coming  to hospital.  Much appreciate palliative care as they have helped her make the decision that she does not  want further dialysis  Given this turn of events - renal will discontinue her daily renal function panel-  could maybe check it every other or every third day.  Renal will follow at a distance -  call us back if needed     Myalgia/chronic pain - negative covid-19, flu, and RSV PCR.  Likely due to ongoing substance use. Hypokalmeia - replete gingerly given CKD stage V. Polysubstance abuse - initially denied use.  Admitted to snorting cocaine and amphetamine after discussing her UDS.  She denied any IV drug use or skin popping in her right arm. Homelessness - will need SW assistance prior to  discharge. Hepatitis c - hep C ab + will order HCV RNA. Metabolic acidosis - oral sodium bicarb but still acidemic  [name]  [employer] [phone]

## 2023-04-20 ENCOUNTER — Inpatient Hospital Stay (HOSPITAL_COMMUNITY): Payer: Medicare Other

## 2023-04-20 DIAGNOSIS — Z7189 Other specified counseling: Secondary | ICD-10-CM | POA: Diagnosis not present

## 2023-04-20 DIAGNOSIS — Z515 Encounter for palliative care: Secondary | ICD-10-CM | POA: Diagnosis not present

## 2023-04-20 DIAGNOSIS — T827XXA Infection and inflammatory reaction due to other cardiac and vascular devices, implants and grafts, initial encounter: Secondary | ICD-10-CM | POA: Diagnosis not present

## 2023-04-20 LAB — CBC
HCT: 25.8 % — ABNORMAL LOW (ref 36.0–46.0)
Hemoglobin: 8.8 g/dL — ABNORMAL LOW (ref 12.0–15.0)
MCH: 32.1 pg (ref 26.0–34.0)
MCHC: 34.1 g/dL (ref 30.0–36.0)
MCV: 94.2 fL (ref 80.0–100.0)
Platelets: 281 10*3/uL (ref 150–400)
RBC: 2.74 MIL/uL — ABNORMAL LOW (ref 3.87–5.11)
RDW: 17 % — ABNORMAL HIGH (ref 11.5–15.5)
WBC: 5.9 10*3/uL (ref 4.0–10.5)
nRBC: 1.9 % — ABNORMAL HIGH (ref 0.0–0.2)

## 2023-04-20 LAB — CULTURE, BLOOD (ROUTINE X 2)
Culture: NO GROWTH
Culture: NO GROWTH

## 2023-04-20 LAB — BASIC METABOLIC PANEL
Anion gap: 13 (ref 5–15)
BUN: 79 mg/dL — ABNORMAL HIGH (ref 8–23)
CO2: 18 mmol/L — ABNORMAL LOW (ref 22–32)
Calcium: 8.8 mg/dL — ABNORMAL LOW (ref 8.9–10.3)
Chloride: 106 mmol/L (ref 98–111)
Creatinine, Ser: 6.12 mg/dL — ABNORMAL HIGH (ref 0.44–1.00)
GFR, Estimated: 7 mL/min — ABNORMAL LOW (ref >60)
Glucose, Bld: 93 mg/dL (ref 70–99)
Potassium: 3.8 mmol/L (ref 3.5–5.1)
Sodium: 137 mmol/L (ref 135–145)

## 2023-04-20 LAB — VANCOMYCIN, RANDOM: Vancomycin Rm: 17 ug/mL

## 2023-04-20 LAB — MAGNESIUM: Magnesium: 1.8 mg/dL (ref 1.7–2.4)

## 2023-04-20 MED ORDER — VANCOMYCIN HCL 750 MG/150ML IV SOLN
750.0000 mg | INTRAVENOUS | Status: DC
  Filled 2023-04-20: qty 150

## 2023-04-20 MED ORDER — DOXYCYCLINE HYCLATE 100 MG PO TABS
100.0000 mg | ORAL_TABLET | Freq: Two times a day (BID) | ORAL | Status: DC
  Administered 2023-04-20 – 2023-04-21 (×2): 100 mg via ORAL
  Filled 2023-04-20 (×2): qty 1

## 2023-04-20 NOTE — Progress Notes (Signed)
Pharmacy Antibiotic Note  [name] is a 63 y.o. female admitted on 04/15/2023 with  concern for AV fistula infection. Pharmacy has been consulted for Vancomycin dosing.  Patient with CKD stage V declining [facility] term HD after discussion with palliative and nephro. Now DNR/DNI. Cellulitis on AV fistula has been improving. Patient is afebrile with wbc 5.9.   Vancomycin levels continue to be within range in 48 hour intervals. Blood cultures continue to have no growth to date. Fluctuation in SCr around ~6 mg/dL range. Despite fluctuation, levels remain appropriate for q48h dosing.  VR @ 1/29 = 11 VR @ 2/1 = 17   Plan: Vancomycin 750 mg IV q48h   Monitor for any changes to HD plans Continue ceftriaxone 1g IV q24h  Follow up final culture, clinical progress and antibiotic plans  Weight: 49.9 kg (110 lb)  Temp (24hrs), Avg:97.8 F (36.6 C), Min:97.6 F (36.4 C), Max:98 F (36.7 C)  Recent Labs  Lab 04/15/23 1616 04/15/23 1850 04/15/23 2023 04/16/23 0314 04/16/23 0952 04/16/23 0953 04/16/23 1433 04/17/23 0454 04/18/23 0552 04/19/23 0437 04/20/23 0603  WBC 7.8  --   --   --   --  4.8 5.2 3.5*  --  5.1 5.9  CREATININE 5.82*  --   --   --  5.82*  --   --  6.28* 6.58* 6.46*  --   LATICACIDVEN  --  0.3* 0.4* 0.6  --   --   --   --   --   --   --   VANCORANDOM  --   --   --   --   --   --   --   --  11  --  17    Estimated Creatinine Clearance: 7.1 mL/min (A) (by C-G formula based on SCr of 6.46 mg/dL (H)).    Allergies  Allergen Reactions   Zestril [Lisinopril] Other (See Comments)    Hyperkalemia    Antimicrobials this admission: Vancomycin 1/28>> Zosyn 1/28>> Ceftriaxone x 1 on 1/28  Dose adjustments this admission: 1/31 VR 11 ~48 hrs after dose 1gm dose on 1/29 > Vanc 750 IV x 1 today 2/2 VR 17 ~48 hrs after 750 mg dose on 2/1 > Vanc 750 IV x 1 today  Microbiology results: 1/28 blood: ng final 1/29 urine: multiple species 1/28 COVID, flu and RSV: neg  Thank you  for allowing pharmacy to be a part of this patient's care.  Jerry Caras, PharmD PGY2 Oncology Pharmacy Resident   04/20/2023 8:19 AM

## 2023-04-20 NOTE — Progress Notes (Signed)
[facility]                                                                               [name], is a 63 y.o. female, DOB - 01/16/1961, AVW:[id number] Admit date - 04/15/2023    Outpatient Primary MD for the patient is Laural Benes, [name], MD  LOS - 4  days    Brief summary   63 year old F with PMH of ESRD not on HD for 2 months, COPD, CHF, HTN, HLD, hep C, anemia, anxiety, polysubstance use and noncompliance presenting with myalgia, generalized weakness and subjective fever, and admitted due to concern for right AV fistula infection, anemia and elevated troponin.    In ED, febrile to 100.9.  Slightly tachycardic and tachypneic.  BP elevated.  WBC 7.8.  Hgb 6.6 (baseline 7-8). Cr 5.82.  BUN 82.  Bicarb 10.  AG 17.  CK normal.  Lactic acid negative.  Anemia panel with low iron saturation.  Troponin 131=>137>135.  EKG NSR.  UA with moderate LE, > 300 protein and rare bacteria.  UDS, urine culture and blood culture ordered.  Patient was started on broad-spectrum antibiotics.  Nephrology and vascular surgery consulted.    UDS positive for amphetamine and cocaine.  Blood cultures NGTD.  Evaluated by vascular surgery.  AV fistula duplex without significant finding.  Remains on broad-spectrum antibiotics.     Patient is not compliant with HD and not interested in regular outpatient HD.  Palliative medicine consulted.  CODE STATUS changed to DNR/DNI.  Assessment & Plan    Assessment and Plan:  Sepsis secondary to right arm cellulitis with possible AV fistula involvement UDS positive for cocaine and amphetamine Blood cultures negative till date Patient started on vancomycin and ceftriaxone, with improvement in cellulitis transition to oral doxycycline and continue with Rocephin to complete the course.    End-stage renal disease with noncompliance to dialysis Patient currently is not interested in [facility]-term HD and nephrology is aware.  Palliative care consulted and CODE  STATUS changed to DNR/DNI.    Anion gap metabolic acidosis probably secondary to azotemia Continue with sodium bicarbonate    Acute anemia on anemia of chronic disease  secondary to end-stage renal disease S/p 2 units of PRBC transfusion IV iron and aranesp as per nephrology. Transfuse to keep hemoglobin greater than 7   Chronic COPD No wheezing heard on exam.    Polysubstance abuse Advised to refrain from recreational drugs.  Discussed the UDS results with the patient.    Left shoulder pain Pain control and x-rays of the shoulder ordered.    Essential hypertension Blood pressure parameters are optimal Continue with amlodipine 10 mg daily    Hypokalemia Resolved    History of hepatitis C Outpatient follow-up with PCP   Diarrhea Resolved   RN Pressure Injury Documentation: Pressure Injury 11/10/22 Coccyx Mid Stage 2 -  Partial thickness loss of dermis presenting as a shallow open injury with a red, pink wound bed without slough. (Active)  11/10/22 0205  Location: Coccyx  Location Orientation: Mid  Staging: Stage 2 -  Partial thickness loss of dermis presenting as a shallow open injury with a red, pink wound bed  without slough.  Wound Description (Comments):   Present on Admission: Yes     Pressure Injury 11/10/22 Buttocks Left Stage 2 -  Partial thickness loss of dermis presenting as a shallow open injury with a red, pink wound bed without slough. (Active)  11/10/22 0205  Location: Buttocks  Location Orientation: Left  Staging: Stage 2 -  Partial thickness loss of dermis presenting as a shallow open injury with a red, pink wound bed without slough.  Wound Description (Comments):   Present on Admission: Yes    Malnutrition Type:  Nutrition Problem: Severe Malnutrition Etiology: social / environmental circumstances   Malnutrition Characteristics:  Signs/Symptoms: severe muscle depletion, severe fat depletion, percent weight loss Percent weight  loss: 15 %   Nutrition Interventions:  Interventions: Ensure Enlive (each supplement provides 350kcal and 20 grams of protein), Magic cup, Snacks, Liberalize Diet  Estimated body mass index is 17.23 kg/m as calculated from the following:   Height as of 02/10/23: 5\' 7"  (1.702 m).   Weight as of this encounter: 49.9 kg.  Code Status: DNR limited.  DVT Prophylaxis:  heparin injection 5,000 Units Start: 04/16/23 0600   Level of Care: Level of care: Med-Surg Family Communication: none at bedside.   Disposition Plan:     Remains inpatient appropriate:   pending clinical improvement.   Procedures:  None.   Consultants:   Nephrology Palliative medicine.  Vascular surgery.   Antimicrobials:   Anti-infectives (From admission, onward)    Start     Dose/Rate Route Frequency Ordered Stop   04/18/23 1400  cefTRIAXone (ROCEPHIN) 1 g in sodium chloride 0.9 % 100 mL IVPB        1 g 200 mL/hr over 30 Minutes Intravenous Every 24 hours 04/18/23 1211     04/18/23 1015  vancomycin (VANCOREADY) IVPB 750 mg/150 mL  Status:  Discontinued        750 mg 150 mL/hr over 60 Minutes Intravenous  Once 04/18/23 0915 04/18/23 0928   04/18/23 1015  vancomycin (VANCOCIN) 750 mg in sodium chloride 0.9 % 250 mL IVPB        750 mg 250 mL/hr over 60 Minutes Intravenous  Once 04/18/23 0928 04/18/23 1807   04/16/23 1400  piperacillin-tazobactam (ZOSYN) IVPB 2.25 g  Status:  Discontinued        2.25 g 100 mL/hr over 30 Minutes Intravenous Every 8 hours 04/16/23 0531 04/18/23 1211   04/16/23 0535  vancomycin variable dose per unstable renal function (pharmacist dosing)         Does not apply See admin instructions 04/16/23 0535     04/16/23 0300  vancomycin (VANCOCIN) IVPB 1000 mg/200 mL premix        1,000 mg 200 mL/hr over 60 Minutes Intravenous  Once 04/16/23 0249 04/16/23 0735   04/16/23 0300  piperacillin-tazobactam (ZOSYN) IVPB 3.375 g        3.375 g 100 mL/hr over 30 Minutes Intravenous  Once  04/16/23 0249 04/16/23 0604   04/15/23 2115  cefTRIAXone (ROCEPHIN) 2 g in sodium chloride 0.9 % 100 mL IVPB        2 g 200 mL/hr over 30 Minutes Intravenous  Once 04/15/23 2102 04/15/23 2333        Medications  Scheduled Meds:  busPIRone  10 mg Oral BID   calcium acetate  1,334 mg Oral TID WC   [START ON 04/23/2023] darbepoetin (ARANESP) injection - NON-DIALYSIS  200 mcg Subcutaneous Q Wed-1800   feeding supplement (NEPRO CARB STEADY)  237 mL Oral BID BM   fluticasone furoate-vilanterol  1 puff Inhalation Daily   And   umeclidinium bromide  1 puff Inhalation Daily   gabapentin  100 mg Oral TID   heparin  5,000 Units Subcutaneous Q8H   lidocaine  3 patch Transdermal Q24H   multivitamin  1 tablet Oral QHS   nicotine  14 mg Transdermal Daily   pantoprazole  40 mg Oral Daily   sodium bicarbonate  1,300 mg Oral TID   torsemide  100 mg Oral Daily   vancomycin variable dose per unstable renal function (pharmacist dosing)   Does not apply See admin instructions   Continuous Infusions:  cefTRIAXone (ROCEPHIN)  IV 1 g (04/20/23 1427)   PRN Meds:.acetaminophen **OR** acetaminophen, hydrOXYzine, ipratropium-albuterol, loperamide, ondansetron **OR** ondansetron (ZOFRAN) IV, senna-docusate, traMADol, traZODone    Subjective:   [place] was seen and examined today.  Left sided shoulder pain. Right arm cellulitis and tenderness has improved.   Objective:   Vitals:   04/19/23 1427 04/19/23 2137 04/20/23 0510 04/20/23 0800  BP: (!) 176/71 (!) 186/77 (!) 147/67 (!) 144/78  Pulse: 78 82 79 81  Resp:    17  Temp: 98 F (36.7 C) 97.9 F (36.6 C) 97.6 F (36.4 C) (!) 97.5 F (36.4 C)  TempSrc: Oral Oral Oral Oral  SpO2: 99% 100% 97% 100%  Weight:        Intake/Output Summary (Last 24 hours) at 04/20/2023 1506 Last data filed at 04/20/2023 0600 Gross per 24 hour  Intake --  Output 1 ml  Net -1 ml   Filed Weights   04/15/23 1545  Weight: 49.9 kg     Exam General exam:  Appears calm and comfortable  Respiratory system: Clear to auscultation. Respiratory effort normal. Cardiovascular system: S1 & S2 heard, RRR. Gastrointestinal system: Abdomen is nondistended, soft and nontender. Central nervous system: Alert and oriented.  Extremities: left shoulder tenderness. Right arm tenderness improving.  Skin: No rashes,  Psychiatry: Mood & affect appropriate.    Data Reviewed:  I have personally reviewed following labs and imaging studies   CBC Lab Results  Component Value Date   WBC 5.9 04/20/2023   RBC 2.74 (L) 04/20/2023   HGB 8.8 (L) 04/20/2023   HCT 25.8 (L) 04/20/2023   MCV 94.2 04/20/2023   MCH 32.1 04/20/2023   PLT 281 04/20/2023   MCHC 34.1 04/20/2023   RDW 17.0 (H) 04/20/2023   LYMPHSABS 1.2 04/16/2023   MONOABS 0.1 04/16/2023   EOSABS 0.0 04/16/2023   BASOSABS 0.0 04/16/2023     Last metabolic panel Lab Results  Component Value Date   NA 137 04/20/2023   K 3.8 04/20/2023   CL 106 04/20/2023   CO2 18 (L) 04/20/2023   BUN 79 (H) 04/20/2023   CREATININE 6.12 (H) 04/20/2023   GLUCOSE 93 04/20/2023   GFRNONAA 7 (L) 04/20/2023   GFRAA 36 (L) 08/31/2019   CALCIUM 8.8 (L) 04/20/2023   PHOS 5.4 (H) 04/19/2023   PROT 5.9 (L) 04/16/2023   ALBUMIN 2.4 (L) 04/19/2023   LABGLOB 3.7 06/06/2022   AGRATIO 1.1 (L) 06/06/2022   BILITOT 0.5 04/16/2023   ALKPHOS 98 04/16/2023   AST 17 04/16/2023   ALT 18 04/16/2023   ANIONGAP 13 04/20/2023    CBG (last 3)  No results for input(s): "GLUCAP" in the last 72 hours.    Coagulation Profile: Recent Labs  Lab 04/16/23 0952  INR 1.2     Radiology Studies: No results  found.     Kathlen Mody M.D. [facility] 04/20/2023, 3:06 PM  Available via Epic secure chat 7am-7pm After 7 pm, please refer to night coverage provider listed on amion.

## 2023-04-20 NOTE — Progress Notes (Signed)
Mobility Specialist Progress Note:   04/20/23 1225  Mobility  Activity Ambulated with assistance in hallway  Level of Assistance Contact guard assist, steadying assist  Assistive Device None  Distance Ambulated (ft) 200 ft  Activity Response Tolerated well  Mobility Referral Yes  Mobility visit 1 Mobility  Mobility Specialist Start Time (ACUTE ONLY) 1128  Mobility Specialist Stop Time (ACUTE ONLY) 1140  Mobility Specialist Time Calculation (min) (ACUTE ONLY) 12 min   Pt received in bed, agreeable to mobility with encouragement. SB bed mobility. MinG during ambulation. Pt displayed slightly improved gait stability compared to yesterday. C/o slight lower back pain, otherwise asx throughout. When returning to room pt requesting to use BR. Void successful. Pt returned to bed with call bell in reach and all needs met. Bed alarm on.   Leory Plowman  Mobility Specialist Please contact via SecureChat Rehab office at [phone]
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
## 2023-04-21 ENCOUNTER — Other Ambulatory Visit (HOSPITAL_COMMUNITY): Payer: Self-pay

## 2023-04-21 DIAGNOSIS — N186 End stage renal disease: Secondary | ICD-10-CM | POA: Diagnosis not present

## 2023-04-21 DIAGNOSIS — Z91199 Patient's noncompliance with other medical treatment and regimen due to unspecified reason: Secondary | ICD-10-CM | POA: Diagnosis not present

## 2023-04-21 DIAGNOSIS — D649 Anemia, unspecified: Secondary | ICD-10-CM | POA: Diagnosis not present

## 2023-04-21 DIAGNOSIS — T827XXA Infection and inflammatory reaction due to other cardiac and vascular devices, implants and grafts, initial encounter: Secondary | ICD-10-CM | POA: Diagnosis not present

## 2023-04-21 DIAGNOSIS — Z789 Other specified health status: Secondary | ICD-10-CM

## 2023-04-21 MED ORDER — DOXYCYCLINE HYCLATE 100 MG PO TABS
100.0000 mg | ORAL_TABLET | Freq: Two times a day (BID) | ORAL | 0 refills | Status: AC
  Filled 2023-04-21: qty 10, 5d supply, fill #0

## 2023-04-21 MED ORDER — AMOXICILLIN-POT CLAVULANATE 500-125 MG PO TABS
1.0000 | ORAL_TABLET | Freq: Every day | ORAL | 0 refills | Status: AC
  Filled 2023-04-21: qty 5, 5d supply, fill #0

## 2023-04-21 NOTE — Progress Notes (Signed)
Transport entered room to take to discharge lounge

## 2023-04-21 NOTE — Progress Notes (Signed)
This chaplain responded to PMT NP-Dawn consult for creating/updating the Pt. Advance Directive. The Pt. chooses to complete HCPOA and Living Will after receiving AD education and answering claraifying questions. Family is not at the bedside.  The chaplain is present with the Pt., notary, and witnesses for the notarizing of the Pt. AD. The Pt. chooses [name] as her healthcare agent. If this person is unable or unwilling to serve in this role the Pt. next choice is [name].  The chaplain gave the Pt. the original AD along with two copies. The Pt. scanned the Pt. AD into the Pt. EMR.  The chaplain listened reflectively to the Pt. express the importance documenting her voice through ACP. The Pt. is preparing for discharge as the chaplain ends the visit.  This chaplain is available for F/U spiritual care as needed.  Chaplain Stephanie Acre [phone]

## 2023-04-21 NOTE — Progress Notes (Signed)
Physical Therapy Treatment Patient Details Name: [name] MRN: [id number] DOB: 07-Jun-1960 Today's Date: 04/21/2023   History of Present Illness 63 yo female presents to [facility] 04/16/23 with complete body pain in her legs, lower back, arm. Noncompliant with HD (last ~3 mos ago) with anemia (transfused 1 unit). Suspected R UE cellulitis and AV fistula infection with pt on vanc/zosyn. Dialysis access duplex performed 1/29, no mention of fluid collection, blood cx <12 hr without growth. UDS positive for amphetamine and cocaine. PMH includes ESRD with noncompliance, DMII, COPD, PAD, GERD, GIB, AVM, CHF, anemia, polysubstance abuse, HLD, hep C, tobacco use disorder and homelessness.    PT Comments  Pt received up in bathroom, pt agreeable to therapy session with emphasis on gait and stair negotiation. Pt modI for gait/stairs and reports her main concern is her ankle weakness. Pt given HEP handout for home (Visit [email or web address] Access Code: [facility]) and would continue to benefit from HHPT to ensure pt making progress with ankle HEP and balance as there wasn't time for teachback of HEP with pt requesting to speak with chaplain at end of session.    If plan is discharge home, recommend the following: A little help with walking and/or transfers;A little help with bathing/dressing/bathroom;Assist for transportation;Help with stairs or ramp for entrance   Can travel by private vehicle        Equipment Recommendations  None recommended by PT    Recommendations for Other Services       Precautions / Restrictions Precautions Precautions: Fall Precaution Comments: Contact precs Restrictions Weight Bearing Restrictions Per Provider Order: No     Mobility  Bed Mobility               General bed mobility comments: received up in her room    Transfers Overall transfer level: Needs assistance Equipment used: None Transfers: Sit to/from Stand Sit to Stand: Modified  independent (Device/Increase time)                Ambulation/Gait Ambulation/Gait assistance: Modified independent (Device/Increase time) Gait Distance (Feet): 250 Feet Assistive device: None Gait Pattern/deviations: Step-through pattern, Narrow base of support, Decreased dorsiflexion - left, Decreased dorsiflexion - right       General Gait Details: no LOB or buckling, does not need UE support   Stairs Stairs: Yes Stairs assistance: Modified independent (Device/Increase time) Stair Management: One rail Right, Alternating pattern, Forwards Number of Stairs: 13 General stair comments: cues for sequencing/safety if in more pain (step-to) vs reciprocal pattern, pt given HHA for safety if not using rails with CGA for initial demo, pt using rail ascending modI per her preference and wall descending as per home set-up, modI with wall support and no other support descending, pt with reciprocal pattern descending mostly modI unsupported   Wheelchair Mobility     Tilt Bed    Modified Rankin (Stroke Patients Only)       Balance Overall balance assessment: Needs assistance Sitting-balance support: Feet supported, No upper extremity supported Sitting balance-Leahy Scale: Good Sitting balance - Comments: sitting EOB   Standing balance support: No upper extremity supported, During functional activity Standing balance-Leahy Scale: Fair Standing balance comment: unsupported in hallway no LOB                            Cognition Arousal: Alert Behavior During Therapy: Restless, Impulsive Overall Cognitive Status: Within Functional Limits for tasks assessed  Exercises Other Exercises Other Exercises: see handout link above; no acute s/sx distress while pt mobilizing but pt wanting to meet with other care team member so did not have pt demo back exercises    General Comments General comments (skin integrity,  edema, etc.): pt given ankle HEP handout Visit    [email or web address]    Access Code: [email or web address] and red theraband to perform with HEP as pt states her ankles at times feel to weak to support her      Pertinent Vitals/Pain Pain Assessment Pain Assessment: Faces Faces Pain Scale: Hurts little more Pain Location: RUE, back, legs Pain Descriptors / Indicators: Aching, Discomfort Pain Intervention(s): Monitored during session    Home Living                          Prior Function            PT Goals (current goals can now be found in the care plan section) Acute Rehab PT Goals Patient Stated Goal: to get stronger PT Goal Formulation: With patient Time For Goal Achievement: 05/01/23 Progress towards PT goals: Progressing toward goals    Frequency    Min 1X/week      PT Plan      Co-evaluation              AM-PAC PT "6 Clicks" Mobility   Outcome Measure  Help needed turning from your back to your side while in a flat bed without using bedrails?: None Help needed moving from lying on your back to sitting on the side of a flat bed without using bedrails?: None Help needed moving to and from a bed to a chair (including a wheelchair)?: None Help needed standing up from a chair using your arms (e.g., wheelchair or bedside chair)?: None Help needed to walk in hospital room?: None Help needed climbing 3-5 steps with a railing? : None 6 Click Score: 24    End of Session Equipment Utilized During Treatment: Gait belt Activity Tolerance: Patient tolerated treatment well Patient left: in chair;with call bell/phone within reach;Other (comment) (rep from chaplain dept present) Nurse Communication: Mobility status PT Visit Diagnosis: Unsteadiness on feet (R26.81);Muscle weakness (generalized) (M62.81)     Time: 1206-1216 PT Time Calculation (min) (ACUTE ONLY): 10 min  Charges:    $Gait Training: 8-22 mins PT General Charges $$ ACUTE PT VISIT:  1 Visit                     [name] P., PTA Acute Rehabilitation Services Secure Chat Preferred 9a-5:30pm Office: [phone]    Dorathy Kinsman [facility] 04/21/2023, 1:34 PM

## 2023-04-21 NOTE — Discharge Planning (Signed)
Patient discharge packet reviewed at bedside, IV access removed, patient being transported via public Bus with bus pass, transport came to assist to bring patient to bus stop.

## 2023-04-21 NOTE — Progress Notes (Signed)
Occupational Therapy Treatment Patient Details Name: [name] MRN: [id number] DOB: [birth year] Today's Date: 04/21/2023   History of present illness 63 yo female presents to [facility] 04/16/23 with complete body pain in her legs, lower back, arm. Noncompliant with HD (last ~3 mos ago) with anemia (transfused 1 unit). Suspected R UE cellulitis and AV fistula infection with pt on vanc/zosyn. Dialysis access duplex performed 1/29, no mention of fluid collection, blood cx <12 hr without growth. UDS positive for amphetamine and cocaine. PMH includes ESRD with noncompliance, DMII, COPD, PAD, GERD, GIB, AVM, CHF, anemia, polysubstance abuse, HLD, hep C, tobacco use disorder and homelessness.   OT comments  Patient making good gains with OT treatment with self care tasks. Patient able to ambulate to rehab gym and perform tub transfer with supervision. Patient performed toileting tasks and grooming with distant supervision. Patient was provided with handout for energy conservation strategies and reviewed with patient. Fall prevention strategies reviewed with patient. Discharge recommendations continue to be appropriate to return home with no OT to follow up.       If plan is discharge home, recommend the following:  Assist for transportation;A little help with walking and/or transfers;A little help with bathing/dressing/bathroom   Equipment Recommendations  None recommended by OT    Recommendations for Other Services      Precautions / Restrictions Precautions Precautions: Fall Precaution Comments: Contact precs Restrictions Weight Bearing Restrictions Per Provider Order: No       Mobility Bed Mobility Overal bed mobility: Modified Independent                  Transfers Overall transfer level: Needs assistance Equipment used: None Transfers: Sit to/from Stand Sit to Stand: Supervision           General transfer comment: supervision for safety     Balance Overall balance  assessment: Needs assistance Sitting-balance support: Feet supported, No upper extremity supported Sitting balance-Leahy Scale: Good Sitting balance - Comments: sitting EOB   Standing balance support: No upper extremity supported Standing balance-Leahy Scale: Fair Standing balance comment: able to stand at sink and perform mobility with no UE support                           ADL either performed or assessed with clinical judgement   ADL Overall ADL's : Needs assistance/impaired     Grooming: Wash/dry hands;Wash/dry face;Brushing hair;Supervision/safety;Standing Grooming Details (indicate cue type and reason): distant supervision         Upper Body Dressing : Supervision/safety;Standing Upper Body Dressing Details (indicate cue type and reason): distant supervision Lower Body Dressing: Supervision/safety Lower Body Dressing Details (indicate cue type and reason): donned socks Toilet Transfer: Supervision/safety;Grab Paramedic Details (indicate cue type and reason): distant supervision Toileting- Clothing Manipulation and Hygiene: Modified independent   Tub/ Shower Transfer: Tub Sports administrator Details (indicate cue type and reason): supervision for safety   General ADL Comments: possible at baseline    Extremity/Trunk Assessment              Vision       Perception     Praxis      Cognition Arousal: Alert Behavior During Therapy: Restless, Impulsive Overall Cognitive Status: Within Functional Limits for tasks assessed  Exercises      Shoulder Instructions       General Comments provided patient with handout for energy conservation and reviewed with patient. Reviewed fall prevention techniques    Pertinent Vitals/ Pain       Pain Assessment Pain Assessment: Faces Faces Pain Scale: Hurts little more Pain Location: RUE, back,  legs Pain Descriptors / Indicators: Aching, Discomfort Pain Intervention(s): Limited activity within patient's tolerance, Monitored during session, Premedicated before session, Repositioned  Home Living                                          Prior Functioning/Environment              Frequency  Min 1X/week        Progress Toward Goals  OT Goals(current goals can now be found in the care plan section)  Progress towards OT goals: Progressing toward goals  Acute Rehab OT Goals Patient Stated Goal: to go home with medically ready OT Goal Formulation: With patient Time For Goal Achievement: 05/01/23 Potential to Achieve Goals: Good ADL Goals Pt Will Perform Tub/Shower Transfer: with modified independence;ambulating Additional ADL Goal #1: Pt will indepedently verbalize 3 energy consevation strategies Additional ADL Goal #2: Pt will independently verbalize 3 strateiges to reduce riskof falls  Plan      Co-evaluation                 AM-PAC OT "6 Clicks" Daily Activity     Outcome Measure   Help from another person eating meals?: None Help from another person taking care of personal grooming?: A Little Help from another person toileting, which includes using toliet, bedpan, or urinal?: A Little Help from another person bathing (including washing, rinsing, drying)?: A Little Help from another person to put on and taking off regular upper body clothing?: A Little Help from another person to put on and taking off regular lower body clothing?: A Little 6 Click Score: 19    End of Session    OT Visit Diagnosis: Unsteadiness on feet (R26.81);Other abnormalities of gait and mobility (R26.89);Muscle weakness (generalized) (M62.81);Pain Pain - Right/Left: Right Pain - part of body: Arm   Activity Tolerance Patient tolerated treatment well   Patient Left in bed;with call bell/phone within reach   Nurse Communication Mobility status         Time: 1610-9604 OT Time Calculation (min): 34 min  Charges: OT General Charges $OT Visit: 1 Visit OT Treatments $Self Care/Home Management : 8-22 mins $Therapeutic Activity: 8-22 mins  Alfonse Flavors, OTA Acute Rehabilitation Services  Office [phone]   Dewain Penning 04/21/2023, 12:45 PM

## 2023-04-21 NOTE — Discharge Summary (Signed)
Physician Discharge Summary  [name] YNW:[id number] DOB: October 30, 1960 DOA: 04/15/2023  PCP: [name], MD  Admit date: 04/15/2023 Discharge date: 04/21/23  Admitted From: Home Disposition: Home Recommendations for Outpatient Follow-up:  Follow up with PCP in 1 to 2 weeks Check CMP and CBC at follow-up Please follow up on the following pending results: None  Home Health: No need identified Equipment/Devices: No need identified  Discharge Condition: Stable CODE STATUS: DNR/DNI  Follow-up Information     [name], MD Follow up.   Specialty: Internal Medicine Contact information: [address] [place] [address] [phone]                 Hospital course 63 year old F with PMH of ESRD not on HD for 2 months, COPD, CHF, HTN, HLD, hep C, anemia, anxiety, polysubstance use and noncompliance presenting with myalgia, generalized weakness and subjective fever, and admitted due to concern for right AV fistula infection, anemia and elevated troponin.   In ED, febrile to 100.9.  Slightly tachycardic and tachypneic.  BP elevated.  WBC 7.8.  Hgb 6.6 (baseline 7-8). Cr 5.82.  BUN 82.  Bicarb 10.  AG 17.  CK normal.  Lactic acid negative.  Anemia panel with low iron saturation.  Troponin 131=>137>135.  EKG NSR.  UA with moderate LE, > 300 protein and rare bacteria.  UDS, urine culture and blood culture ordered.  Patient was started on broad-spectrum antibiotics.  Nephrology and vascular surgery consulted.    UDS positive for amphetamine and cocaine.  Blood cultures NGTD.  Evaluated by vascular surgery.  AV fistula duplex without significant finding.  Remains on broad-spectrum antibiotics.     Patient is not compliant with HD and and expressed her desire not to pursue regular dialysis but coming to ED to have intermittent dialysis as needed.  Palliative medicine consulted.  CODE STATUS changed to DNR/DNI.  On the day of discharge, cellulitis  improved significantly.  She was cleared for discharge by all consultants including vascular, nephrology and palliative.  She is discharged on p.o. Augmentin 500 mg daily and doxycycline 100 mg twice daily for 5 more days to complete treatment course.  See individual problem list below for more.   Problems addressed during this hospitalization Sepsis due to right arm cellulitis with possible AV fistula involvement: Patient has fever, tachycardia and tachypnea on presentation.  Has no leukocytosis.  UDS positive for cocaine and amphetamine.  Patient denied IV drug use until confronted by results.  aVF Doppler without significant finding.   Blood cultures NGTD.  Cellulitis improved. -Continue vancomycin 1/28>> doxycycline 12/2-12/8 -Zosyn 1/28 >> ceftriaxone 1/31 >> Augmentin 2/3-2/8  -Counseled on the importance of refraining from drug use.   Acute on chronic anemia: Hgb dropped to 6.6.  Baseline Hgb 7-8.  No overt bleeding.  Hemoccult negative.  Anemia panel with low iron saturation.  Not on anticoagulation or antiplatelet.  H&H stable after 2 units.   Myalgia/chronic pain syndrome: Myalgia likely due to substance use.  UDS positive for cocaine and amphetamine.  CK within normal.  COVID-19, influenza and RSV PCR nonreactive.  Tylenol as needed   ESRD/CKD-5: Reports making urine.  Last HD in November.  Followed by Dr. Ronalee Belts.  Palliative consulted.  Patient expressed desire not to pursue regular dialysis other than coming to ED as needed.  Cleared for discharge by nephrology. -Continue home torsemide   Anion gap metabolic acidosis: Likely due to azotemia -Continue home bicarbonate  Polysubstance use: Patient denied recreational drug use until confronted by UDS result which is positive for cocaine and amphetamine.  She admitted to snorting cocaine but denies IVDU. -Advised to refrain from recreational drugs -Encouraged smoking cessation -Nicotine patch   Chronic COPD: Stable -Continue home  inhalers -Encouraged smoking cessation   Essential hypertension: BP improved. -Continue home Demadex   Hypokalemia: Resolved.   Hyperphosphatemia: Improved. Lajoyce Corners per nephrology   Hepatitis C-doubt candidacy for treatment given ongoing substance use   Anxiety and depression: Stable   Diarrhea: Likely due to antibiotics and possibly uremia.  Low suspicion for C. difficile.  Resolved.  Severe malnutrition: POA Nutrition Problem: Severe Malnutrition Etiology: social / environmental circumstances Signs/Symptoms: severe muscle depletion, severe fat depletion, percent weight loss Percent weight loss: 15 % Interventions: Ensure Enlive (each supplement provides 350kcal and 20 grams of protein), Magic cup, Snacks, Liberalize Diet   Time spent 35 minutes  Vital signs Vitals:   04/20/23 2054 04/21/23 0502 04/21/23 0743 04/21/23 0821  BP: (!) 178/73 (!) 149/69 (!) 176/81 Comment: nurse is aware   Pulse: 80 71 81   Temp: 97.9 F (36.6 C) 98.3 F (36.8 C) 97.9 F (36.6 C)   Resp: 18 18    Weight:      SpO2: 99% 98% 100% 98%  TempSrc: Oral Oral Oral      Discharge exam  GENERAL: No apparent distress.  Nontoxic. HEENT: MMM.  Vision and hearing grossly intact.  NECK: Supple.  No apparent JVD.  RESP:  No IWOB.  Fair aeration bilaterally. CVS:  RRR. Heart sounds normal.  ABD/GI/GU: BS+. Abd soft, NTND.  AV fistula in RUE with good bruits and thrills. MSK/EXT:  Moves extremities. No apparent deformity. No edema.  SKIN: no apparent skin lesion or wound.  Erythema over aVF resolved. NEURO: Awake and alert. Oriented appropriately.  No apparent focal neuro deficit. PSYCH: Calm. Normal affect.   Discharge Instructions Discharge Instructions     Diet - low sodium heart healthy   Complete by: As directed    Discharge instructions   Complete by: As directed    It has been a pleasure taking care of you!  You were hospitalized due to right arm infection/cellulitis for which you  have been treated with IV antibiotics.  We are discharging you on oral antibiotics to complete treatment course.  Please review your new medication list and the directions on your medications before you take them.  We strongly recommend you refrain from using cocaine, amphetamine and other recreational drugs.  Follow-up with your primary care doctor in 1 to 2 weeks or sooner if needed.   Take care,   Increase activity slowly   Complete by: As directed       Allergies as of 04/21/2023       Reactions   Zestril [lisinopril] Other (See Comments)   Hyperkalemia        Medication List     TAKE these medications    acetaminophen 500 MG tablet Commonly known as: TYLENOL Take 2 tablets (1,000 mg total) by mouth 2 (two) times daily as needed for moderate pain (pain score 4-6), fever or headache.   albuterol 108 (90 Base) MCG/ACT inhaler Commonly known as: VENTOLIN HFA Inhale 2 puffs into the lungs every 6 (six) hours as needed for wheezing or shortness of breath. What changed: how much to take   amoxicillin-clavulanate 500-125 MG tablet Commonly known as: Augmentin Take 1 tablet by mouth daily for 5 days.   ascorbic  acid 500 MG tablet Commonly known as: VITAMIN C Take 500 mg by mouth daily.   atorvastatin 10 MG tablet Commonly known as: LIPITOR Take 1 tablet (10 mg total) by mouth daily.   busPIRone 10 MG tablet Commonly known as: BUSPAR Take 1 tablet (10 mg total) by mouth 2 (two) times daily.   doxycycline 100 MG tablet Commonly known as: VIBRA-TABS Take 1 tablet (100 mg total) by mouth every 12 (twelve) hours for 5 days.   ferrous sulfate 325 (65 FE) MG tablet Take 1 tablet (325 mg total) by mouth daily with breakfast.   gabapentin 100 MG capsule Commonly known as: NEURONTIN Take 1 capsule (100 mg total) by mouth 3 (three) times daily.   hydrOXYzine 25 MG tablet Commonly known as: ATARAX Take 1 tablet (25 mg total) by mouth 3 (three) times daily as needed for  anxiety or itching.   ipratropium-albuterol 0.5-2.5 (3) MG/3ML Soln Commonly known as: DUONEB Take 3 mLs by nebulization every 6 (six) hours as needed (shortness of breath and wheezing).   multivitamin Tabs tablet Take 1 tablet by mouth at bedtime.   pantoprazole 40 MG tablet Commonly known as: PROTONIX Take 1 tablet (40mg ) by mouth daily   sodium bicarbonate 650 MG tablet Take 1 tablet (650 mg total) by mouth 3 (three) times daily.   torsemide 100 MG tablet Commonly known as: DEMADEX Take 1 tablet (100 mg total) by mouth daily.   traZODone 50 MG tablet Commonly known as: DESYREL Take 1 tablet (50 mg total) by mouth at bedtime as needed for sleep.   Trelegy Ellipta 100-62.5-25 MCG/ACT Aepb Generic drug: Fluticasone-Umeclidin-Vilant Inhale 1 puff into the lungs daily.   VITAMIN B COMPLEX PO Take 1 tablet by mouth daily. Unknown strength        Consultations: Vascular surgery Nephrology Palliative medicine  Procedures/Studies:   DG Shoulder Left Result Date: 04/20/2023 CLINICAL DATA:  Acute left shoulder pain EXAM: LEFT SHOULDER - 2+ VIEW COMPARISON:  09/16/2012 FINDINGS: Mild degenerative AC joint spurring. Subacromial morphology is type 2 (curved). No fracture or malalignment.  No acute bony findings. Atherosclerotic calcification of the aortic arch. IMPRESSION: 1. Mild degenerative AC joint spurring. 2.  Aortic Atherosclerosis (ICD10-I70.0). Electronically Signed   By: Gaylyn Rong M.D.   On: 04/20/2023 17:40   VAS US DUPLEX DIALYSIS ACCESS (AVF, AVG) Result Date: 04/16/2023 DIALYSIS ACCESS Patient Name:  [name]  Date of Exam:   04/16/2023 Medical Rec #: [id number]       Accession #:    [id number] Date of Birth: 1960-03-22       Patient Gender: F Patient Age:   2 years Exam Location:  [facility] Procedure:      VAS US DUPLEX DIALYSIS ACCESS (AVF, AVG) Referring Phys: Lemar Livings  --------------------------------------------------------------------------------  Reason for Exam: Mechanical complication of AVF. Access Site: Right Upper Extremity. Access Type: Brachial-basilic AVF. Performing Technologist: Ernestene Mention RVT, RDMS  Examination Guidelines: A complete evaluation includes B-mode imaging, spectral Doppler, color Doppler, and power Doppler as needed of all accessible portions of each vessel. Unilateral testing is considered an integral part of a complete examination. Limited examinations for reoccurring indications may be performed as noted.  Findings: +--------------------+----------+-----------------+--------+ AVF                 PSV (cm/s)Flow Vol (mL/min)Comments +--------------------+----------+-----------------+--------+ Native artery inflow   209           561                +--------------------+----------+-----------------+--------+  AVF Anastomosis        289                              +--------------------+----------+-----------------+--------+  +------------+----------+-------------+----------+--------+ OUTFLOW VEINPSV (cm/s)Diameter (cm)Depth (cm)Describe +------------+----------+-------------+----------+--------+ Prox UA        268                                    +------------+----------+-------------+----------+--------+ Mid UA         139                                    +------------+----------+-------------+----------+--------+ Dist UA        319                                    +------------+----------+-------------+----------+--------+ AC Fossa       265                                    +------------+----------+-------------+----------+--------+   Summary: Patent AVF with volume flow <600 cm/s.  *See table(s) above for measurements and observations.  Diagnosing physician: Heath Lark Electronically signed by Heath Lark on 04/16/2023 at 4:17:01 PM.     --------------------------------------------------------------------------------   Final    DG Forearm Right Result Date: 04/16/2023 CLINICAL DATA:  Pain and swelling in the right elbow, initial encounter EXAM: RIGHT FOREARM - 2 VIEW COMPARISON:  None Available. FINDINGS: No acute fracture or dislocation is noted. No soft tissue abnormality is seen. Surgical clips are noted along the distal humerus. IMPRESSION: No acute abnormality noted Electronically Signed   By: Alcide Clever M.D.   On: 04/16/2023 03:12   MR Lumbar Spine W Wo Contrast Result Date: 04/16/2023 CLINICAL DATA:  concern for lumbar infection EXAM: MRI LUMBAR SPINE WITHOUT AND WITH CONTRAST TECHNIQUE: Multiplanar and multiecho pulse sequences of the lumbar spine were obtained without and with intravenous contrast. CONTRAST:  5mL GADAVIST GADOBUTROL 1 MMOL/ML IV SOLN COMPARISON:  None Available. FINDINGS: Segmentation:  Standard. Alignment:  Normal. Vertebrae: Mild degenerative/discogenic endplate signal changes at L4-L5 and L5-S1. Otherwise, no marrow edema to suggest discitis/osteomyelitis or acute fracture. No suspicious bone lesions. No abnormal enhancement. Conus medullaris and cauda equina: Conus extends to the T12-L1 level. Conus and cauda equina appear normal. No visible canal fluid collection. Paraspinal and other soft tissues: Small left kidney. Right kidney is not visible and may be absent or atrophic. Disc levels: L1-L2: No significant disc protrusion, foraminal stenosis, or canal stenosis. L2-L3: Disc bulging and left foraminal disc protrusion. Mild canal stenosis. No significant foraminal stenosis. L3-L4: Small left foraminal disc protrusion. Resulting mild left foraminal stenosis. Patent canal and right foramen. L4-L5: Disc height loss and desiccation. Disc bulging. Mild canal stenosis. Resulting moderate right foraminal stenosis. Patent left foramen. L5-S1: Disc height loss and desiccation. Disc bulging. Central disc protrusion.  Bilateral facet arthropathy. Mild bilateral foraminal stenosis. Bilateral facet arthropathy. IMPRESSION: 1. No specific evidence of discitis/osteomyelitis or other acute abnormality. 2. Mild canal stenosis at L2-L3 and L4-L5. 3. Moderate right foraminal stenosis at L4-L5. 4. Mild foraminal stenosis on the left at L3-L4 and bilaterally at L5-S1. Electronically  Signed   By: Feliberto Harts M.D.   On: 04/16/2023 00:09   DG Chest 2 View Result Date: 04/15/2023 CLINICAL DATA:  Sepsis.  Bilateral leg pain. EXAM: CHEST - 2 VIEW COMPARISON:  02/10/2023 FINDINGS: Heart size and mediastinal contours appear normal. Small right pleural effusion is improved when compared with the previous exam. No signs of interstitial edema or airspace consolidation. Bones appear osteopenic and there is mild multilevel degenerative disc disease. IMPRESSION: Small right pleural effusion, improved when compared with the previous exam. No signs of pneumonia. Electronically Signed   By: Signa Kell M.D.   On: 04/15/2023 19:43       The results of significant diagnostics from this hospitalization (including imaging, microbiology, ancillary and laboratory) are listed below for reference.     Microbiology: Recent Results (from the past 240 hours)  Resp panel by RT-PCR (RSV, Flu A&B, Covid) Anterior Nasal Swab     Status: None   Collection Time: 04/15/23  3:56 PM   Specimen: Anterior Nasal Swab  Result Value Ref Range Status   SARS Coronavirus 2 by RT PCR NEGATIVE NEGATIVE Final   Influenza A by PCR NEGATIVE NEGATIVE Final   Influenza B by PCR NEGATIVE NEGATIVE Final    Comment: (NOTE) The Xpert Xpress SARS-CoV-2/FLU/RSV plus assay is intended as an aid in the diagnosis of influenza from Nasopharyngeal swab specimens and should not be used as a sole basis for treatment. Nasal washings and aspirates are unacceptable for Xpert Xpress SARS-CoV-2/FLU/RSV testing.  Fact Sheet for  Patients: BloggerCourse.com  Fact Sheet for Healthcare Providers: SeriousBroker.it  This test is not yet approved or cleared by the [place] FDA and has been authorized for detection and/or diagnosis of SARS-CoV-2 by FDA under an Emergency Use Authorization (EUA). This EUA will remain in effect (meaning this test can be used) for the duration of the COVID-19 declaration under Section 564(b)(1) of the Act, 21 U.S.C. section 360bbb-3(b)(1), unless the authorization is terminated or revoked.     Resp Syncytial Virus by PCR NEGATIVE NEGATIVE Final    Comment: (NOTE) Fact Sheet for Patients: BloggerCourse.com  Fact Sheet for Healthcare Providers: SeriousBroker.it  This test is not yet approved or cleared by the [place] FDA and has been authorized for detection and/or diagnosis of SARS-CoV-2 by FDA under an Emergency Use Authorization (EUA). This EUA will remain in effect (meaning this test can be used) for the duration of the COVID-19 declaration under Section 564(b)(1) of the Act, 21 U.S.C. section 360bbb-3(b)(1), unless the authorization is terminated or revoked.  Performed at [facility], [address], [place], [place] [address]   Blood culture (routine x 2)     Status: None   Collection Time: 04/15/23  6:58 PM   Specimen: BLOOD LEFT ARM  Result Value Ref Range Status   Specimen Description BLOOD LEFT ARM  Final   Special Requests   Final    BOTTLES DRAWN AEROBIC AND ANAEROBIC Blood Culture results may not be optimal due to an inadequate volume of blood received in culture bottles   Culture   Final    NO GROWTH 5 DAYS Performed at [facility], [address], [place], [place] [address]    Report Status 04/20/2023 FINAL  Final  Blood culture (routine x 2)     Status: None   Collection Time: 04/15/23  6:58 PM   Specimen: BLOOD LEFT ARM  Result Value  Ref Range Status   Specimen Description BLOOD LEFT  ARM  Final   Special Requests   Final    AEROBIC BOTTLE ONLY Blood Culture results may not be optimal due to an inadequate volume of blood received in culture bottles   Culture   Final    NO GROWTH 5 DAYS Performed at [facility], [address] [place], [place] [address]    Report Status 04/20/2023 FINAL  Final  Urine Culture (for pregnant, neutropenic or urologic patients or patients with an indwelling urinary catheter)     Status: Abnormal   Collection Time: 04/16/23  2:00 AM   Specimen: In/Out Cath Urine  Result Value Ref Range Status   Specimen Description IN/OUT CATH URINE  Final   Special Requests   Final    NONE Performed at [facility], [address], [place], [place] [address]    Culture MULTIPLE SPECIES PRESENT, SUGGEST RECOLLECTION (A)  Final   Report Status 04/17/2023 FINAL  Final     Labs:  CBC: Recent Labs  Lab 04/15/23 1616 04/16/23 0953 04/16/23 1433 04/17/23 0454 04/17/23 1531 04/19/23 0437 04/20/23 0603  WBC 7.8 4.8 5.2 3.5*  --  5.1 5.9  NEUTROABS 6.6 3.5  --   --   --   --   --   HGB 6.6* 6.6* 7.0* 6.6* 7.6* 7.8* 8.8*  HCT 19.7* 19.8* 21.0* 18.4* 21.3* 22.8* 25.8*  MCV 100.5* 99.5 100.0 95.3  --  93.8 94.2  PLT 293 211 211 203  --  227 281   BMP &GFR Recent Labs  Lab 04/16/23 0952 04/17/23 0454 04/18/23 0552 04/19/23 0437 04/20/23 0603  NA 130* 130* 135 138 137  K 3.3* 3.2* 3.7 3.4* 3.8  CL 110 109 113* 110 106  CO2 10* 11* 12* 13* 18*  GLUCOSE 99 103* 102* 101* 93  BUN 75* 83* 78* 75* 79*  CREATININE 5.82* 6.28* 6.58* 6.46* 6.12*  CALCIUM 8.0* 7.7* 8.0* 8.2* 8.8*  MG 1.7 1.8  --  1.6* 1.8  PHOS 5.4* 6.1* 6.7* 5.4*  --    Estimated Creatinine Clearance: 7.5 mL/min (A) (by C-G formula based on SCr of 6.12 mg/dL (H)). Liver & Pancreas: Recent Labs  Lab 04/16/23 2952 04/17/23 0454 04/18/23 0552 04/19/23 0437  AST 17  --   --   --   ALT 18  --   --   --   ALKPHOS  98  --   --   --   BILITOT 0.5  --   --   --   PROT 5.9*  --   --   --   ALBUMIN 2.7* 2.5* 2.6* 2.4*   No results for input(s): "LIPASE", "AMYLASE" in the last 168 hours. No results for input(s): "AMMONIA" in the last 168 hours. Diabetic: No results for input(s): "HGBA1C" in the last 72 hours. No results for input(s): "GLUCAP" in the last 168 hours. Cardiac Enzymes: Recent Labs  Lab 04/15/23 1616  CKTOTAL 72   No results for input(s): "PROBNP" in the last 8760 hours. Coagulation Profile: Recent Labs  Lab 04/16/23 0952  INR 1.2   Thyroid Function Tests: No results for input(s): "TSH", "T4TOTAL", "FREET4", "T3FREE", "THYROIDAB" in the last 72 hours. Lipid Profile: No results for input(s): "CHOL", "HDL", "LDLCALC", "TRIG", "CHOLHDL", "LDLDIRECT" in the last 72 hours. Anemia Panel: No results for input(s): "VITAMINB12", "FOLATE", "FERRITIN", "TIBC", "IRON", "RETICCTPCT" in the last 72 hours. Urine analysis:    Component Value Date/Time   COLORURINE YELLOW 04/16/2023 0200   APPEARANCEUR HAZY (  A) 04/16/2023 0200   LABSPEC 1.009 04/16/2023 0200   PHURINE 6.0 04/16/2023 0200   GLUCOSEU 50 (A) 04/16/2023 0200   HGBUR SMALL (A) 04/16/2023 0200   BILIRUBINUR NEGATIVE 04/16/2023 0200   KETONESUR NEGATIVE 04/16/2023 0200   PROTEINUR >=300 (A) 04/16/2023 0200   UROBILINOGEN 0.2 03/02/2010 2027   NITRITE NEGATIVE 04/16/2023 0200   LEUKOCYTESUR MODERATE (A) 04/16/2023 0200   Sepsis Labs: Invalid input(s): "PROCALCITONIN", "LACTICIDVEN"   SIGNED:  Almon Hercules, MD  [facility] 04/21/2023, 1:54 PM

## 2023-04-22 ENCOUNTER — Telehealth: Payer: Self-pay

## 2023-04-22 DIAGNOSIS — J449 Chronic obstructive pulmonary disease, unspecified: Secondary | ICD-10-CM

## 2023-04-22 NOTE — Care Management Important Message (Addendum)
Important Message  Patient Details  Name: [name] MRN: [id number] Date of Birth: [birth year]   Important Message Given:  Yes - Medicare IM   Patient left prior to IM delivery will mail a copy to the patient home address.    Yale Golla 04/22/2023, 12:20 PM
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
## 2023-04-23 MED ORDER — HYDROXYZINE HCL 25 MG PO TABS: 25.0000 mg | ORAL_TABLET | Freq: Three times a day (TID) | ORAL | 0 refills | Status: DC | PRN

## 2023-04-24 ENCOUNTER — Telehealth: Payer: Self-pay

## 2023-04-24 NOTE — Transitions of Care (Post Inpatient/ED Visit) (Signed)
   04/24/2023  Name: [name] MRN: [id number] DOB: 08/14/1960  Today's TOC FU Call Status: Today's TOC FU Call Status:: Unsuccessful Call (1st Attempt) Unsuccessful Call (1st Attempt) Date: 04/24/23  Attempted to reach the patient regarding the most recent Inpatient/ED visit.Patient was called in an Outreach attempt to offer [facility]  30-day TOC program. Pt is eligible for program due to potential risk for readmission and/or high utilization. Unfortunately, TOC RN CM was not able to speak with the patient in regards to recent hospital discharge   Patient's voicemail has  a generic greeting. To maintain HIPAA compliance, left message with [facility] RN CM would reach out again at another time. Patient was contacted by practice TOC on 04/22/23 and was being followed by SW at [facility]. TOC RN CM contacted SW by secure chat to let know of admission and discharge.   Follow Up Plan: Additional outreach attempts will be made to reach the patient to complete the Transitions of Care (Post Inpatient/ED visit) call.    Bing Edison MSN, RN RN [employer]  [facility]-Population Health Office Hours Wed/Thur  8:00 am-6:00 pm Direct Dial : [phone] Main Phone [phone]  Fax: [phone] [email or web address]

## 2023-04-25 ENCOUNTER — Telehealth: Payer: Self-pay

## 2023-04-25 NOTE — Transitions of Care (Post Inpatient/ED Visit) (Signed)
   04/25/2023  Name: [name] MRN: [id number] DOB: 01-25-1961  Today's TOC FU Call Status: Today's TOC FU Call Status:: Unsuccessful Call (2nd Attempt) Unsuccessful Call (2nd Attempt) Date: 04/25/23  Attempted to reach the patient regarding the most recent Inpatient/ED visit.  Follow Up Plan: Additional outreach attempts will be made to reach the patient to complete the Transitions of Care (Post Inpatient/ED visit) call.   Channing Larry, RN, BA, [facility], CRRN [facility] Coordinator, Transition of Care Ph # [phone]

## 2023-04-28 ENCOUNTER — Other Ambulatory Visit: Payer: Self-pay

## 2023-05-01 ENCOUNTER — Other Ambulatory Visit: Payer: Self-pay | Admitting: Internal Medicine

## 2023-05-01 DIAGNOSIS — J449 Chronic obstructive pulmonary disease, unspecified: Secondary | ICD-10-CM

## 2023-05-01 MED ORDER — IPRATROPIUM-ALBUTEROL 0.5-2.5 (3) MG/3ML IN SOLN
3.0000 mL | Freq: Four times a day (QID) | RESPIRATORY_TRACT | 6 refills | Status: DC | PRN
  Filled 2023-05-01: qty 360, 30d supply, fill #0

## 2023-05-01 NOTE — Telephone Encounter (Signed)
Duoneb treatments sent to our pharmacy.

## 2023-05-02 ENCOUNTER — Other Ambulatory Visit: Payer: Self-pay

## 2023-05-02 NOTE — Telephone Encounter (Signed)
Nebulizer order successfully submitted through [employer] on 05/02/2023.

## 2023-05-08 ENCOUNTER — Emergency Department (HOSPITAL_COMMUNITY): Payer: Medicaid Other

## 2023-05-08 ENCOUNTER — Inpatient Hospital Stay (HOSPITAL_COMMUNITY)
Admission: EM | Admit: 2023-05-08 | Discharge: 2023-05-13 | DRG: 356 | Disposition: A | Discharge status: Home / Self Care | Payer: Medicaid Other | Attending: Internal Medicine | Admitting: Internal Medicine

## 2023-05-08 DIAGNOSIS — K222 Esophageal obstruction: Secondary | ICD-10-CM | POA: Diagnosis present

## 2023-05-08 DIAGNOSIS — K5900 Constipation, unspecified: Secondary | ICD-10-CM | POA: Diagnosis present

## 2023-05-08 DIAGNOSIS — Z560 Unemployment, unspecified: Secondary | ICD-10-CM

## 2023-05-08 DIAGNOSIS — Z860101 Personal history of adenomatous and serrated colon polyps: Secondary | ICD-10-CM

## 2023-05-08 DIAGNOSIS — K921 Melena: Secondary | ICD-10-CM | POA: Diagnosis not present

## 2023-05-08 DIAGNOSIS — Z681 Body mass index (BMI) 19 or less, adult: Secondary | ICD-10-CM

## 2023-05-08 DIAGNOSIS — E785 Hyperlipidemia, unspecified: Secondary | ICD-10-CM | POA: Diagnosis present

## 2023-05-08 DIAGNOSIS — K31811 Angiodysplasia of stomach and duodenum with bleeding: Secondary | ICD-10-CM | POA: Diagnosis not present

## 2023-05-08 DIAGNOSIS — K5730 Diverticulosis of large intestine without perforation or abscess without bleeding: Secondary | ICD-10-CM | POA: Diagnosis present

## 2023-05-08 DIAGNOSIS — K5520 Angiodysplasia of colon without hemorrhage: Secondary | ICD-10-CM | POA: Diagnosis present

## 2023-05-08 DIAGNOSIS — Z5982 Transportation insecurity: Secondary | ICD-10-CM

## 2023-05-08 DIAGNOSIS — F1410 Cocaine abuse, uncomplicated: Secondary | ICD-10-CM | POA: Diagnosis present

## 2023-05-08 DIAGNOSIS — K3189 Other diseases of stomach and duodenum: Secondary | ICD-10-CM | POA: Diagnosis present

## 2023-05-08 DIAGNOSIS — I10 Essential (primary) hypertension: Secondary | ICD-10-CM | POA: Diagnosis present

## 2023-05-08 DIAGNOSIS — N186 End stage renal disease: Secondary | ICD-10-CM | POA: Diagnosis present

## 2023-05-08 DIAGNOSIS — Z860102 Personal history of hyperplastic colon polyps: Secondary | ICD-10-CM

## 2023-05-08 DIAGNOSIS — Z79899 Other long term (current) drug therapy: Secondary | ICD-10-CM

## 2023-05-08 DIAGNOSIS — Z5900 Homelessness unspecified: Secondary | ICD-10-CM

## 2023-05-08 DIAGNOSIS — K449 Diaphragmatic hernia without obstruction or gangrene: Secondary | ICD-10-CM | POA: Diagnosis present

## 2023-05-08 DIAGNOSIS — F411 Generalized anxiety disorder: Secondary | ICD-10-CM | POA: Diagnosis present

## 2023-05-08 DIAGNOSIS — G894 Chronic pain syndrome: Secondary | ICD-10-CM | POA: Diagnosis present

## 2023-05-08 DIAGNOSIS — J449 Chronic obstructive pulmonary disease, unspecified: Secondary | ICD-10-CM | POA: Diagnosis present

## 2023-05-08 DIAGNOSIS — K219 Gastro-esophageal reflux disease without esophagitis: Secondary | ICD-10-CM | POA: Diagnosis present

## 2023-05-08 DIAGNOSIS — K2960 Other gastritis without bleeding: Secondary | ICD-10-CM | POA: Diagnosis present

## 2023-05-08 DIAGNOSIS — Z66 Do not resuscitate: Secondary | ICD-10-CM | POA: Diagnosis present

## 2023-05-08 DIAGNOSIS — I739 Peripheral vascular disease, unspecified: Secondary | ICD-10-CM | POA: Diagnosis present

## 2023-05-08 DIAGNOSIS — Z833 Family history of diabetes mellitus: Secondary | ICD-10-CM

## 2023-05-08 DIAGNOSIS — I120 Hypertensive chronic kidney disease with stage 5 chronic kidney disease or end stage renal disease: Secondary | ICD-10-CM | POA: Diagnosis present

## 2023-05-08 DIAGNOSIS — F1010 Alcohol abuse, uncomplicated: Secondary | ICD-10-CM | POA: Diagnosis present

## 2023-05-08 DIAGNOSIS — Z888 Allergy status to other drugs, medicaments and biological substances status: Secondary | ICD-10-CM

## 2023-05-08 DIAGNOSIS — E1122 Type 2 diabetes mellitus with diabetic chronic kidney disease: Secondary | ICD-10-CM | POA: Diagnosis present

## 2023-05-08 DIAGNOSIS — I2489 Other forms of acute ischemic heart disease: Secondary | ICD-10-CM | POA: Diagnosis present

## 2023-05-08 DIAGNOSIS — Z7951 Long term (current) use of inhaled steroids: Secondary | ICD-10-CM

## 2023-05-08 DIAGNOSIS — J441 Chronic obstructive pulmonary disease with (acute) exacerbation: Secondary | ICD-10-CM | POA: Diagnosis not present

## 2023-05-08 DIAGNOSIS — Z91411 Personal history of adult psychological abuse: Secondary | ICD-10-CM

## 2023-05-08 DIAGNOSIS — Z5986 Financial insecurity: Secondary | ICD-10-CM

## 2023-05-08 DIAGNOSIS — Z515 Encounter for palliative care: Secondary | ICD-10-CM

## 2023-05-08 DIAGNOSIS — B1920 Unspecified viral hepatitis C without hepatic coma: Secondary | ICD-10-CM | POA: Diagnosis present

## 2023-05-08 DIAGNOSIS — K267 Chronic duodenal ulcer without hemorrhage or perforation: Secondary | ICD-10-CM | POA: Diagnosis present

## 2023-05-08 DIAGNOSIS — K254 Chronic or unspecified gastric ulcer with hemorrhage: Secondary | ICD-10-CM | POA: Diagnosis present

## 2023-05-08 DIAGNOSIS — K2980 Duodenitis without bleeding: Secondary | ICD-10-CM | POA: Diagnosis present

## 2023-05-08 DIAGNOSIS — E8770 Fluid overload, unspecified: Secondary | ICD-10-CM | POA: Diagnosis present

## 2023-05-08 DIAGNOSIS — F1994 Other psychoactive substance use, unspecified with psychoactive substance-induced mood disorder: Secondary | ICD-10-CM

## 2023-05-08 DIAGNOSIS — D62 Acute posthemorrhagic anemia: Secondary | ICD-10-CM | POA: Diagnosis present

## 2023-05-08 DIAGNOSIS — F1910 Other psychoactive substance abuse, uncomplicated: Secondary | ICD-10-CM | POA: Diagnosis present

## 2023-05-08 DIAGNOSIS — F32A Depression, unspecified: Secondary | ICD-10-CM

## 2023-05-08 DIAGNOSIS — E46 Unspecified protein-calorie malnutrition: Secondary | ICD-10-CM | POA: Diagnosis present

## 2023-05-08 DIAGNOSIS — R627 Adult failure to thrive: Secondary | ICD-10-CM | POA: Diagnosis present

## 2023-05-08 DIAGNOSIS — N185 Chronic kidney disease, stage 5: Secondary | ICD-10-CM | POA: Diagnosis present

## 2023-05-08 DIAGNOSIS — F419 Anxiety disorder, unspecified: Secondary | ICD-10-CM

## 2023-05-08 DIAGNOSIS — K2950 Unspecified chronic gastritis without bleeding: Secondary | ICD-10-CM | POA: Diagnosis present

## 2023-05-08 DIAGNOSIS — F17210 Nicotine dependence, cigarettes, uncomplicated: Secondary | ICD-10-CM | POA: Diagnosis present

## 2023-05-08 DIAGNOSIS — D649 Anemia, unspecified: Secondary | ICD-10-CM

## 2023-05-08 DIAGNOSIS — E872 Acidosis, unspecified: Secondary | ICD-10-CM | POA: Diagnosis present

## 2023-05-08 DIAGNOSIS — R54 Age-related physical debility: Secondary | ICD-10-CM | POA: Diagnosis present

## 2023-05-08 LAB — COMPREHENSIVE METABOLIC PANEL
ALT: 67 U/L — ABNORMAL HIGH (ref 0–44)
AST: 73 U/L — ABNORMAL HIGH (ref 15–41)
Albumin: 2.7 g/dL — ABNORMAL LOW (ref 3.5–5.0)
Alkaline Phosphatase: 83 U/L (ref 38–126)
Anion gap: 15 (ref 5–15)
BUN: 84 mg/dL — ABNORMAL HIGH (ref 8–23)
CO2: 13 mmol/L — ABNORMAL LOW (ref 22–32)
Calcium: 8.1 mg/dL — ABNORMAL LOW (ref 8.9–10.3)
Chloride: 111 mmol/L (ref 98–111)
Creatinine, Ser: 5.31 mg/dL — ABNORMAL HIGH (ref 0.44–1.00)
GFR, Estimated: 9 mL/min — ABNORMAL LOW (ref >60)
Glucose, Bld: 111 mg/dL — ABNORMAL HIGH (ref 70–99)
Potassium: 3.8 mmol/L (ref 3.5–5.1)
Sodium: 139 mmol/L (ref 135–145)
Total Bilirubin: 0.4 mg/dL (ref 0.0–1.2)
Total Protein: 5.6 g/dL — ABNORMAL LOW (ref 6.5–8.1)

## 2023-05-08 LAB — RAPID URINE DRUG SCREEN, HOSP PERFORMED
Amphetamines: NOT DETECTED
Barbiturates: NOT DETECTED
Benzodiazepines: NOT DETECTED
Cocaine: POSITIVE — AB
Opiates: NOT DETECTED
Tetrahydrocannabinol: NOT DETECTED

## 2023-05-08 LAB — PROTIME-INR
INR: 1.1 (ref 0.8–1.2)
Prothrombin Time: 14.1 s (ref 11.4–15.2)

## 2023-05-08 LAB — CBC
HCT: 12.6 % — ABNORMAL LOW (ref 36.0–46.0)
Hemoglobin: 4.1 g/dL — CL (ref 12.0–15.0)
MCH: 33.9 pg (ref 26.0–34.0)
MCHC: 32.5 g/dL (ref 30.0–36.0)
MCV: 104.1 fL — ABNORMAL HIGH (ref 80.0–100.0)
Platelets: 210 10*3/uL (ref 150–400)
RBC: 1.21 MIL/uL — ABNORMAL LOW (ref 3.87–5.11)
RDW: 17.4 % — ABNORMAL HIGH (ref 11.5–15.5)
WBC: 4.1 10*3/uL (ref 4.0–10.5)
nRBC: 0 % (ref 0.0–0.2)

## 2023-05-08 LAB — POC OCCULT BLOOD, ED: Fecal Occult Bld: POSITIVE — AB

## 2023-05-08 LAB — LIPASE, BLOOD: Lipase: 40 U/L (ref 11–51)

## 2023-05-08 LAB — PREPARE RBC (CROSSMATCH)

## 2023-05-08 LAB — TROPONIN I (HIGH SENSITIVITY): Troponin I (High Sensitivity): 19 ng/L — ABNORMAL HIGH (ref <18)

## 2023-05-08 MED ORDER — MAGNESIUM SULFATE 2 GM/50ML IV SOLN
2.0000 g | INTRAVENOUS | Status: AC
  Administered 2023-05-08: 2 g via INTRAVENOUS
  Filled 2023-05-08: qty 50

## 2023-05-08 MED ORDER — PANTOPRAZOLE SODIUM 40 MG IV SOLR
40.0000 mg | Freq: Once | INTRAVENOUS | Status: AC
  Administered 2023-05-08: 40 mg via INTRAVENOUS
  Filled 2023-05-08: qty 10

## 2023-05-08 MED ORDER — SODIUM CHLORIDE 0.9% IV SOLUTION: Freq: Once | INTRAVENOUS | Status: DC

## 2023-05-08 NOTE — ED Provider Notes (Signed)
[place] EMERGENCY DEPARTMENT AT [facility] Provider Note   CSN: [id number] Arrival date & time: 05/08/23  2228     History {Add pertinent medical, surgical, social history, OB history to HPI:1} Chief Complaint  Patient presents with   Chest Pain    Elham Judie Petit [place] is a 63 y.o. female with a past medical history of diabetes, end-stage renal disease, alcohol abuse, poorly substance abuse, homelessness, COPD who presents emergency department with chief complaint of chest pain.  EMS reports that the patient stated she is supposed to be on dialysis but has not had dialysis since November because she "has an agreement with her primary care physician."  Patient states she has had intermittent chest pain radiating into her bilateral arms for the past week.  She has had associated nausea without vomiting.  She states that she would not want to be intubated or resuscitated in the event of loss of pulses or respiratory distress.  Patient denies abdominal pain, fever, cough.   Chest Pain      Home Medications Prior to Admission medications   Medication Sig Start Date End Date Taking? Authorizing Provider  acetaminophen (TYLENOL) 500 MG tablet Take 2 tablets (1,000 mg total) by mouth 2 (two) times daily as needed for moderate pain (pain score 4-6), fever or headache. 02/01/23   Ghimire, Werner Lean, MD  albuterol (VENTOLIN HFA) 108 (90 Base) MCG/ACT inhaler Inhale 2 puffs into the lungs every 6 (six) hours as needed for wheezing or shortness of breath. Patient taking differently: Inhale 3 puffs into the lungs every 6 (six) hours as needed for wheezing or shortness of breath. 02/01/23   Ghimire, Werner Lean, MD  ascorbic acid (VITAMIN C) 500 MG tablet Take 500 mg by mouth daily.    [provider]  atorvastatin (LIPITOR) 10 MG tablet Take 1 tablet (10 mg total) by mouth daily. 02/01/23   Ghimire, Werner Lean, MD  B Complex Vitamins (VITAMIN B COMPLEX PO) Take 1 tablet by mouth daily.  Unknown strength    [provider]  busPIRone (BUSPAR) 10 MG tablet Take 1 tablet (10 mg total) by mouth 2 (two) times daily. 02/10/23   Anders Simmonds, PA-C  ferrous sulfate 325 (65 FE) MG tablet Take 1 tablet (325 mg total) by mouth daily with breakfast. 02/01/23   Ghimire, Werner Lean, MD  Fluticasone-Umeclidin-Vilant (TRELEGY ELLIPTA) 100-62.5-25 MCG/ACT AEPB Inhale 1 puff into the lungs daily. 02/01/23   Ghimire, Werner Lean, MD  gabapentin (NEURONTIN) 100 MG capsule Take 1 capsule (100 mg total) by mouth 3 (three) times daily. 02/01/23   Ghimire, Werner Lean, MD  hydrOXYzine (ATARAX) 25 MG tablet Take 1 tablet (25 mg total) by mouth 3 (three) times daily as needed for anxiety or itching. 04/23/23   [name], MD  ipratropium-albuterol (DUONEB) 0.5-2.5 (3) MG/3ML SOLN Take 3 mLs by nebulization every 6 (six) hours as needed (shortness of breath and wheezing). 05/01/23   [name], MD  multivitamin (RENA-VIT) TABS tablet Take 1 tablet by mouth at bedtime. 02/01/23   Ghimire, Werner Lean, MD  pantoprazole (PROTONIX) 40 MG tablet Take 1 tablet (40mg ) by mouth daily 02/01/23   Ghimire, Werner Lean, MD  sodium bicarbonate 650 MG tablet Take 1 tablet (650 mg total) by mouth 3 (three) times daily. 02/01/23   Ghimire, Werner Lean, MD  torsemide (DEMADEX) 100 MG tablet Take 1 tablet (100 mg total) by mouth daily. 02/01/23   Ghimire, Werner Lean, MD  traZODone (DESYREL)  50 MG tablet Take 1 tablet (50 mg total) by mouth at bedtime as needed for sleep. 02/01/23   Ghimire, Werner Lean, MD      Allergies    Zestril [lisinopril]    Review of Systems   Review of Systems  Cardiovascular:  Positive for chest pain.    Physical Exam Updated Vital Signs BP (!) 147/57 (BP Location: Right Arm)   Pulse 90   Temp 97.9 F (36.6 C) (Oral)   Resp 20   SpO2 100%  Physical Exam Vitals and nursing note reviewed.  Constitutional:      General: She is not in acute distress.    Appearance: She is  well-developed. She is not diaphoretic.     Comments: Appears older than stated age, sallow appearance  HENT:     Head: Normocephalic and atraumatic.     Right Ear: External ear normal.     Left Ear: External ear normal.     Nose: Nose normal.     Mouth/Throat:     Mouth: Mucous membranes are moist.  Eyes:     General: No scleral icterus.    Conjunctiva/sclera: Conjunctivae normal.  Cardiovascular:     Rate and Rhythm: Normal rate and regular rhythm.     Heart sounds: Normal heart sounds. No murmur heard.    No friction rub. No gallop.  Pulmonary:     Effort: Pulmonary effort is normal. No respiratory distress.     Breath sounds: Normal breath sounds.  Abdominal:     General: Bowel sounds are normal. There is no distension.     Palpations: Abdomen is soft. There is no mass.     Tenderness: There is no abdominal tenderness. There is no guarding.  Musculoskeletal:     Cervical back: Normal range of motion.  Skin:    General: Skin is warm and dry.  Neurological:     Mental Status: She is alert and oriented to person, place, and time.  Psychiatric:        Behavior: Behavior normal.     ED Results / Procedures / Treatments   Labs (all labs ordered are listed, but only abnormal results are displayed) Labs Reviewed  CBC  COMPREHENSIVE METABOLIC PANEL  LIPASE, BLOOD  I-STAT CHEM 8, ED  TROPONIN I (HIGH SENSITIVITY)    EKG None  Radiology No results found.  Procedures Procedures  {Document cardiac monitor, telemetry assessment procedure when appropriate:1}  Medications Ordered in ED Medications - No data to display  ED Course/ Medical Decision Making/ A&P Clinical Course as of 05/08/23 2244  Thu May 08, 2023  2235 Patient states that she would not want intubation or resuscitation at bedside  [AH]    Clinical Course User Index [AH] Arthor Captain, PA-C   {   Click here for ABCD2, HEART and other calculatorsREFRESH Note before signing :1}                               Medical Decision Making Amount and/or Complexity of Data Reviewed Labs: ordered. Radiology: ordered.   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  which further complicates the presenting complaint. The complaint involves an extensive differential diagnosis and treatment options and also carries with it a high risk of complications and morbidity.    The differential diagnosis includes The emergent differential diagnosis of chest pain includes: Acute coronary syndrome, pericarditis, aortic dissection, pulmonary embolism, tension pneumothorax, pneumonia, and esophageal rupture.   EMS reports patient was given aspirin and nitroglycerin prior to arrival without resolution of symptoms.  The initial plan is to order *** and to treat patient with *** for ***  Additional Tests and treatment considered: Patient is low risk / negative by ***, therefore do not feel that *** is indicated.  Additional history obtained: Additional history obtained from {additional history:26846} Records reviewed {records:26847}  Reassessment and review (also see workup area): Lab Tests: I Ordered, and personally interpreted labs.  The pertinent results include:  ***  Imaging Studies: I ordered and independently visualized and interpreted the following imaging {imaging:26848}  which showed *** The interpretation of the imaging was limited to assessing for emergent pathology, for which purpose it was  ordered.  Consultations Obtained: I requested consultation with the {consultation:26851}, and discussed  findings as well as pertinent plan - they recommend: ***  Medicines ordered and prescription drug management: I ordered the following medications *** for ***  I considered this additional medications: ***   Reevaluation of the patient after these medicines showed that the patient    {resolved/improved/worsened:23923::"improved"}  Social Determinants of Health: Patient's {RUEA:54098}  increases the complexity of managing their presentation  Cardiac Monitoring: The patient was maintained on a cardiac monitor.  I personally viewed and interpreted the cardiac monitor which showed an underlying rhythm of:  {cardiac monitor:26849}  Complexity of problems addressed: Patient's presentation is most consistent with  {COPA:26843} During patient's assessment  Disposition: After consideration of the diagnostic results and the patient's response to treatment,  I feel that the patent would benefit from {disposition:26850}.    {Document critical care time when appropriate:1} {Document review of labs and clinical decision tools ie heart score, Chads2Vasc2 etc:1}  {Document your independent review of radiology images, and any outside records:1} {Document your discussion with family members, caretakers, and with consultants:1} {Document social determinants of health affecting pt's care:1} {Document your decision making why or why not admission, treatments were needed:1} Final Clinical Impression(s) / ED Diagnoses Final diagnoses:  None    Rx / DC Orders ED Discharge Orders     None

## 2023-05-08 NOTE — ED Triage Notes (Signed)
Patient BIB EMS from home. Patient c/o chest pain starting around 1300; worsening around 1900; stopped diaylsis in Nov because her kidney function improved; EMS gave 2 0.4 Nitroglycerin tabs, 324 of ASA, 12L clear

## 2023-05-09 ENCOUNTER — Inpatient Hospital Stay (HOSPITAL_COMMUNITY): Payer: Medicaid Other

## 2023-05-09 ENCOUNTER — Other Ambulatory Visit: Payer: Self-pay

## 2023-05-09 ENCOUNTER — Encounter (HOSPITAL_COMMUNITY)
Admission: EM | Disposition: A | Discharge status: Home / Self Care | Payer: Self-pay | Source: Home / Self Care | Attending: Internal Medicine

## 2023-05-09 ENCOUNTER — Encounter (HOSPITAL_COMMUNITY): Payer: Self-pay | Admitting: Family Medicine

## 2023-05-09 DIAGNOSIS — R1084 Generalized abdominal pain: Secondary | ICD-10-CM | POA: Diagnosis not present

## 2023-05-09 DIAGNOSIS — K222 Esophageal obstruction: Secondary | ICD-10-CM

## 2023-05-09 DIAGNOSIS — K2970 Gastritis, unspecified, without bleeding: Secondary | ICD-10-CM | POA: Diagnosis not present

## 2023-05-09 DIAGNOSIS — F32A Depression, unspecified: Secondary | ICD-10-CM | POA: Diagnosis present

## 2023-05-09 DIAGNOSIS — Z66 Do not resuscitate: Secondary | ICD-10-CM | POA: Diagnosis present

## 2023-05-09 DIAGNOSIS — N186 End stage renal disease: Secondary | ICD-10-CM | POA: Diagnosis present

## 2023-05-09 DIAGNOSIS — N185 Chronic kidney disease, stage 5: Secondary | ICD-10-CM | POA: Diagnosis present

## 2023-05-09 DIAGNOSIS — K449 Diaphragmatic hernia without obstruction or gangrene: Secondary | ICD-10-CM

## 2023-05-09 DIAGNOSIS — J449 Chronic obstructive pulmonary disease, unspecified: Secondary | ICD-10-CM | POA: Diagnosis not present

## 2023-05-09 DIAGNOSIS — F1410 Cocaine abuse, uncomplicated: Secondary | ICD-10-CM | POA: Diagnosis present

## 2023-05-09 DIAGNOSIS — I10 Essential (primary) hypertension: Secondary | ICD-10-CM

## 2023-05-09 DIAGNOSIS — F17210 Nicotine dependence, cigarettes, uncomplicated: Secondary | ICD-10-CM | POA: Diagnosis present

## 2023-05-09 DIAGNOSIS — E785 Hyperlipidemia, unspecified: Secondary | ICD-10-CM | POA: Diagnosis present

## 2023-05-09 DIAGNOSIS — F411 Generalized anxiety disorder: Secondary | ICD-10-CM

## 2023-05-09 DIAGNOSIS — Z681 Body mass index (BMI) 19 or less, adult: Secondary | ICD-10-CM | POA: Diagnosis not present

## 2023-05-09 DIAGNOSIS — J441 Chronic obstructive pulmonary disease with (acute) exacerbation: Secondary | ICD-10-CM | POA: Diagnosis not present

## 2023-05-09 DIAGNOSIS — Z5900 Homelessness unspecified: Secondary | ICD-10-CM | POA: Diagnosis not present

## 2023-05-09 DIAGNOSIS — K31819 Angiodysplasia of stomach and duodenum without bleeding: Secondary | ICD-10-CM | POA: Diagnosis not present

## 2023-05-09 DIAGNOSIS — F1910 Other psychoactive substance abuse, uncomplicated: Secondary | ICD-10-CM

## 2023-05-09 DIAGNOSIS — D649 Anemia, unspecified: Secondary | ICD-10-CM | POA: Diagnosis not present

## 2023-05-09 DIAGNOSIS — G894 Chronic pain syndrome: Secondary | ICD-10-CM | POA: Diagnosis present

## 2023-05-09 DIAGNOSIS — B1920 Unspecified viral hepatitis C without hepatic coma: Secondary | ICD-10-CM | POA: Diagnosis present

## 2023-05-09 DIAGNOSIS — I739 Peripheral vascular disease, unspecified: Secondary | ICD-10-CM | POA: Diagnosis present

## 2023-05-09 DIAGNOSIS — K921 Melena: Secondary | ICD-10-CM | POA: Diagnosis present

## 2023-05-09 DIAGNOSIS — K259 Gastric ulcer, unspecified as acute or chronic, without hemorrhage or perforation: Secondary | ICD-10-CM | POA: Diagnosis not present

## 2023-05-09 DIAGNOSIS — R140 Abdominal distension (gaseous): Secondary | ICD-10-CM | POA: Diagnosis not present

## 2023-05-09 DIAGNOSIS — E46 Unspecified protein-calorie malnutrition: Secondary | ICD-10-CM | POA: Diagnosis present

## 2023-05-09 DIAGNOSIS — I2489 Other forms of acute ischemic heart disease: Secondary | ICD-10-CM | POA: Diagnosis present

## 2023-05-09 DIAGNOSIS — E872 Acidosis, unspecified: Secondary | ICD-10-CM | POA: Diagnosis present

## 2023-05-09 DIAGNOSIS — K254 Chronic or unspecified gastric ulcer with hemorrhage: Secondary | ICD-10-CM | POA: Diagnosis present

## 2023-05-09 DIAGNOSIS — F1010 Alcohol abuse, uncomplicated: Secondary | ICD-10-CM | POA: Diagnosis present

## 2023-05-09 DIAGNOSIS — I120 Hypertensive chronic kidney disease with stage 5 chronic kidney disease or end stage renal disease: Secondary | ICD-10-CM | POA: Diagnosis present

## 2023-05-09 DIAGNOSIS — K5900 Constipation, unspecified: Secondary | ICD-10-CM | POA: Diagnosis not present

## 2023-05-09 DIAGNOSIS — Z515 Encounter for palliative care: Secondary | ICD-10-CM | POA: Diagnosis not present

## 2023-05-09 DIAGNOSIS — D62 Acute posthemorrhagic anemia: Secondary | ICD-10-CM | POA: Diagnosis present

## 2023-05-09 DIAGNOSIS — K31811 Angiodysplasia of stomach and duodenum with bleeding: Secondary | ICD-10-CM | POA: Diagnosis present

## 2023-05-09 DIAGNOSIS — E1122 Type 2 diabetes mellitus with diabetic chronic kidney disease: Secondary | ICD-10-CM | POA: Diagnosis present

## 2023-05-09 HISTORY — PX: HOT HEMOSTASIS: SHX5433

## 2023-05-09 HISTORY — PX: ENTEROSCOPY: SHX5533

## 2023-05-09 LAB — HEMOGLOBIN AND HEMATOCRIT, BLOOD
HCT: 24.5 % — ABNORMAL LOW (ref 36.0–46.0)
HCT: 20 % — ABNORMAL LOW (ref 36.0–46.0)
HCT: 22 % — ABNORMAL LOW (ref 36.0–46.0)
Hemoglobin: 7.6 g/dL — ABNORMAL LOW (ref 12.0–15.0)
Hemoglobin: 6.7 g/dL — CL (ref 12.0–15.0)
Hemoglobin: 7.4 g/dL — ABNORMAL LOW (ref 12.0–15.0)

## 2023-05-09 LAB — GLUCOSE, CAPILLARY: Glucose-Capillary: 147 mg/dL — ABNORMAL HIGH (ref 70–99)

## 2023-05-09 LAB — PREPARE RBC (CROSSMATCH)

## 2023-05-09 LAB — TROPONIN I (HIGH SENSITIVITY): Troponin I (High Sensitivity): 19 ng/L — ABNORMAL HIGH (ref <18)

## 2023-05-09 SURGERY — ENTEROSCOPY
Anesthesia: Monitor Anesthesia Care

## 2023-05-09 MED ORDER — SODIUM BICARBONATE 650 MG PO TABS
650.0000 mg | ORAL_TABLET | Freq: Three times a day (TID) | ORAL | Status: DC
  Administered 2023-05-09 – 2023-05-10 (×3): 650 mg via ORAL
  Filled 2023-05-09 (×3): qty 1

## 2023-05-09 MED ORDER — FENTANYL CITRATE (PF) 100 MCG/2ML IJ SOLN: 25.0000 ug | INTRAMUSCULAR | Status: DC | PRN

## 2023-05-09 MED ORDER — SODIUM CHLORIDE 0.9% IV SOLUTION: Freq: Once | INTRAVENOUS | Status: AC

## 2023-05-09 MED ORDER — ATORVASTATIN CALCIUM 10 MG PO TABS
10.0000 mg | ORAL_TABLET | Freq: Every day | ORAL | Status: DC
  Administered 2023-05-10 – 2023-05-13 (×4): 10 mg via ORAL
  Filled 2023-05-09 (×4): qty 1

## 2023-05-09 MED ORDER — PHENYLEPHRINE HCL (PRESSORS) 10 MG/ML IV SOLN
INTRAVENOUS | Status: DC | PRN
Start: 2023-05-09 — End: 2023-05-09
  Administered 2023-05-09: 120 ug via INTRAVENOUS

## 2023-05-09 MED ORDER — PANTOPRAZOLE SODIUM 40 MG IV SOLR
40.0000 mg | Freq: Two times a day (BID) | INTRAVENOUS | Status: DC
  Administered 2023-05-09 – 2023-05-11 (×4): 40 mg via INTRAVENOUS
  Filled 2023-05-09 (×4): qty 10

## 2023-05-09 MED ORDER — ACETAMINOPHEN 650 MG RE SUPP: 650.0000 mg | Freq: Four times a day (QID) | RECTAL | Status: DC | PRN

## 2023-05-09 MED ORDER — ONDANSETRON HCL 4 MG/2ML IJ SOLN: 4.0000 mg | Freq: Four times a day (QID) | INTRAMUSCULAR | Status: DC | PRN

## 2023-05-09 MED ORDER — LIDOCAINE 2% (20 MG/ML) 5 ML SYRINGE
INTRAMUSCULAR | Status: DC | PRN
  Administered 2023-05-09: 40 mg via INTRAVENOUS

## 2023-05-09 MED ORDER — BUSPIRONE HCL 5 MG PO TABS
10.0000 mg | ORAL_TABLET | Freq: Two times a day (BID) | ORAL | Status: DC
  Administered 2023-05-09 – 2023-05-13 (×9): 10 mg via ORAL
  Filled 2023-05-09 (×7): qty 2
  Filled 2023-05-09: qty 1
  Filled 2023-05-09: qty 2

## 2023-05-09 MED ORDER — FLUTICASONE FUROATE-VILANTEROL 100-25 MCG/ACT IN AEPB
1.0000 | INHALATION_SPRAY | Freq: Every day | RESPIRATORY_TRACT | Status: DC
  Administered 2023-05-10 – 2023-05-13 (×4): 1 via RESPIRATORY_TRACT
  Filled 2023-05-09 (×2): qty 28

## 2023-05-09 MED ORDER — ACETAMINOPHEN 325 MG PO TABS
650.0000 mg | ORAL_TABLET | Freq: Four times a day (QID) | ORAL | Status: DC | PRN
  Administered 2023-05-09 – 2023-05-10 (×2): 650 mg via ORAL
  Filled 2023-05-09 (×3): qty 2

## 2023-05-09 MED ORDER — UMECLIDINIUM BROMIDE 62.5 MCG/ACT IN AEPB
1.0000 | INHALATION_SPRAY | Freq: Every day | RESPIRATORY_TRACT | Status: DC
  Administered 2023-05-11 – 2023-05-13 (×3): 1 via RESPIRATORY_TRACT
  Filled 2023-05-09 (×2): qty 7

## 2023-05-09 MED ORDER — OXYCODONE HCL 5 MG/5ML PO SOLN
5.0000 mg | Freq: Once | ORAL | Status: DC | PRN
Start: 2023-05-09 — End: 2023-05-09

## 2023-05-09 MED ORDER — IPRATROPIUM-ALBUTEROL 0.5-2.5 (3) MG/3ML IN SOLN
3.0000 mL | Freq: Four times a day (QID) | RESPIRATORY_TRACT | Status: DC | PRN
  Administered 2023-05-10 – 2023-05-12 (×3): 3 mL via RESPIRATORY_TRACT
  Filled 2023-05-09 (×3): qty 3

## 2023-05-09 MED ORDER — TORSEMIDE 100 MG PO TABS
100.0000 mg | ORAL_TABLET | Freq: Every day | ORAL | Status: DC
  Administered 2023-05-10 – 2023-05-13 (×4): 100 mg via ORAL
  Filled 2023-05-09 (×6): qty 1

## 2023-05-09 MED ORDER — OXYCODONE HCL 5 MG PO TABS: 5.0000 mg | ORAL_TABLET | Freq: Once | ORAL | Status: DC | PRN

## 2023-05-09 MED ORDER — FENTANYL CITRATE PF 50 MCG/ML IJ SOSY
25.0000 ug | PREFILLED_SYRINGE | INTRAMUSCULAR | Status: DC | PRN
  Administered 2023-05-09 – 2023-05-13 (×10): 25 ug via INTRAVENOUS
  Filled 2023-05-09 (×10): qty 1

## 2023-05-09 MED ORDER — PROPOFOL 10 MG/ML IV BOLUS
INTRAVENOUS | Status: DC | PRN
  Administered 2023-05-09: 40 mg via INTRAVENOUS
  Administered 2023-05-09: 20 mg via INTRAVENOUS
  Administered 2023-05-09: 40 mg via INTRAVENOUS
  Administered 2023-05-09: 20 mg via INTRAVENOUS

## 2023-05-09 MED ORDER — TRAZODONE HCL 50 MG PO TABS
50.0000 mg | ORAL_TABLET | Freq: Every evening | ORAL | Status: DC | PRN
  Administered 2023-05-09 – 2023-05-12 (×4): 50 mg via ORAL
  Filled 2023-05-09 (×4): qty 1

## 2023-05-09 MED ORDER — HYDROXYZINE HCL 25 MG PO TABS
25.0000 mg | ORAL_TABLET | Freq: Three times a day (TID) | ORAL | Status: DC | PRN
  Administered 2023-05-09 – 2023-05-10 (×3): 25 mg via ORAL
  Filled 2023-05-09 (×3): qty 1

## 2023-05-09 MED ORDER — PHENYLEPHRINE HCL-NACL 20-0.9 MG/250ML-% IV SOLN
INTRAVENOUS | Status: DC | PRN
  Administered 2023-05-09: 30 ug/min via INTRAVENOUS

## 2023-05-09 MED ORDER — AMLODIPINE BESYLATE 10 MG PO TABS
10.0000 mg | ORAL_TABLET | Freq: Every day | ORAL | Status: DC
  Administered 2023-05-10 – 2023-05-13 (×4): 10 mg via ORAL
  Filled 2023-05-09 (×4): qty 1

## 2023-05-09 MED ORDER — SODIUM CHLORIDE 0.9% FLUSH
3.0000 mL | Freq: Two times a day (BID) | INTRAVENOUS | Status: DC
  Administered 2023-05-09 – 2023-05-13 (×9): 3 mL via INTRAVENOUS

## 2023-05-09 MED ORDER — GABAPENTIN 100 MG PO CAPS
100.0000 mg | ORAL_CAPSULE | Freq: Three times a day (TID) | ORAL | Status: DC
  Administered 2023-05-09 – 2023-05-13 (×12): 100 mg via ORAL
  Filled 2023-05-09 (×12): qty 1

## 2023-05-09 MED ORDER — FERROUS SULFATE 325 (65 FE) MG PO TABS
325.0000 mg | ORAL_TABLET | Freq: Every day | ORAL | Status: DC
  Administered 2023-05-10 – 2023-05-13 (×4): 325 mg via ORAL
  Filled 2023-05-09 (×4): qty 1

## 2023-05-09 MED ORDER — FUROSEMIDE 10 MG/ML IJ SOLN
60.0000 mg | Freq: Once | INTRAMUSCULAR | Status: AC
  Administered 2023-05-09: 60 mg via INTRAVENOUS
  Filled 2023-05-09: qty 6

## 2023-05-09 MED ORDER — SODIUM CHLORIDE 0.9 % IV SOLN: INTRAVENOUS | Status: DC | PRN

## 2023-05-09 MED ORDER — POLYETHYLENE GLYCOL 3350 17 G PO PACK
17.0000 g | PACK | Freq: Every day | ORAL | Status: DC
  Administered 2023-05-09 – 2023-05-12 (×4): 17 g via ORAL
  Filled 2023-05-09 (×4): qty 1

## 2023-05-09 MED ORDER — PROPOFOL 500 MG/50ML IV EMUL
INTRAVENOUS | Status: DC | PRN
  Administered 2023-05-09: 120 ug/kg/min via INTRAVENOUS

## 2023-05-09 NOTE — H&P (Signed)
History and Physical    PETRICE BEEDY WGN:[id number] DOB: 1961-02-02 DOA: 05/08/2023  PCP: [name], MD   Patient coming from: Home   Chief Complaint: pain all over, nausea, fatigue   HPI: [name] is a 63 y.o. 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  Patient reports that she is hurting all over, experiencing nausea without vomiting, and fatigued.  Her pain has been present for weeks to months but was worse yesterday.  She first developed the nausea yesterday, notes that it has been persistent, but she has not had any vomiting or diarrhea.  She feels weak in general but denies any new focal numbness or weakness.  She has not noticed any change in bowel habits.  ED Course: Upon arrival to the ED, patient is found to be afebrile and saturating well on room air with normal heart rate and stable blood pressure.  Labs are most notable for BUN 84, creatinine 5.31, serum bicarbonate 13, albumin 2.7, AST 73, ALT 67, hemoglobin 4.1, and positive FOBT.  Chest x-ray notable for cardiomegaly and vascular congestion with small bilateral pleural effusions.  Patient was given IV Protonix in the ED and 2 units RBC were ordered for immediate transfusion.  ED PA sent a secure chat to [facility] with request for routine consultation.  Review of Systems:  All other systems reviewed and apart from HPI, are negative.  Past Medical History:  Diagnosis Date   Alcohol abuse    Allergy    Anxiety    Arthritis    Asthma    Cannabis abuse    Cocaine abuse (HCC)    COPD (chronic obstructive pulmonary disease) (HCC)    Depression    Drug addiction (HCC)    GERD (gastroesophageal reflux disease)    Heart murmur    Homelessness    Hyperlipidemia    Hypertension    pt  stated "every once in a while BP will be high but has not been prescribed medication for HTN.    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) 02/22/2016    Past Surgical History:  Procedure Laterality Date   A/V FISTULAGRAM Right 01/31/2023   Procedure: A/V Fistulagram;  Surgeon: Cephus Shelling, MD;  Location: [facility];  Service: Cardiovascular;  Laterality: Right;   AV FISTULA PLACEMENT Right 08/14/2022   Procedure: RIGHT ARM BRACHIOBASILIC ATERIOVENOUS FISTULA CREATION;  Surgeon: Leonie Douglas, MD;  Location: [facility] OR;  Service: Vascular;  Laterality: Right;   BASCILIC VEIN TRANSPOSITION Right 11/13/2022   Procedure: RIGHT ARM SECOND STAGE BASILIC VEIN TRANSPOSITION;  Surgeon: Leonie Douglas, MD;  Location: [facility] OR;  Service: Vascular;  Laterality: Right;   BIOPSY  01/01/2019   Procedure: BIOPSY;  Surgeon: Beverley Fiedler, MD;  Location: [facility] ENDOSCOPY;  Service: Endoscopy;;   BIOPSY  11/17/2021   Procedure: BIOPSY;  Surgeon: Lynann Bologna, MD;  Location: [facility] ENDOSCOPY;  Service: Gastroenterology;;   CESAREAN SECTION  1989   COLONOSCOPY  11/07/2020   2018   CYSTOSCOPY W/ URETERAL STENT PLACEMENT Left 08/01/2018   Procedure: CYSTOSCOPY WITH RETROGRADE PYELOGRAM/URETERAL STENT PLACEMENT;  Surgeon: Crista Elliot, MD;  Location:  [facility] ORS;  Service: Urology;  Laterality: Left;   CYSTOSCOPY WITH RETROGRADE PYELOGRAM, URETEROSCOPY AND STENT PLACEMENT Left 01/15/2019   Procedure: CYSTOSCOPY WITH RETROGRADE PYELOGRAM, URETEROSCOPY AND STENT PLACEMENT;  Surgeon: Crista Elliot, MD;  Location: [facility] ORS;  Service: Urology;  Laterality: Left;   ENTEROSCOPY N/A 08/16/2022   Procedure: ENTEROSCOPY;  Surgeon: Meridee Score Netty Starring., MD;  Location: [facility] ENDOSCOPY;  Service: Gastroenterology;  Laterality: N/A;   ENTEROSCOPY N/A 10/31/2022   Procedure: ENTEROSCOPY;  Surgeon:  Beverley Fiedler, MD;  Location: [facility] ENDOSCOPY;  Service: Gastroenterology;  Laterality: N/A;   ESOPHAGOGASTRODUODENOSCOPY (EGD) WITH PROPOFOL N/A 01/01/2019   Procedure: ESOPHAGOGASTRODUODENOSCOPY (EGD) WITH PROPOFOL;  Surgeon: Beverley Fiedler, MD;  Location: [facility] ENDOSCOPY;  Service: Endoscopy;  Laterality: N/A;   ESOPHAGOGASTRODUODENOSCOPY (EGD) WITH PROPOFOL N/A 08/21/2019   Procedure: ESOPHAGOGASTRODUODENOSCOPY (EGD) WITH PROPOFOL;  Surgeon: Napoleon Form, MD;  Location: [facility] ENDOSCOPY;  Service: Endoscopy;  Laterality: N/A;   ESOPHAGOGASTRODUODENOSCOPY (EGD) WITH PROPOFOL N/A 11/17/2021   Procedure: ESOPHAGOGASTRODUODENOSCOPY (EGD) WITH PROPOFOL;  Surgeon: Lynann Bologna, MD;  Location: [facility] ENDOSCOPY;  Service: Gastroenterology;  Laterality: N/A;   FRACTURE SURGERY Left 2011   arm   HEMOSTASIS CLIP PLACEMENT  08/16/2022   Procedure: HEMOSTASIS CLIP PLACEMENT;  Surgeon: Lemar Lofty., MD;  Location: [facility] ENDOSCOPY;  Service: Gastroenterology;;   HOT HEMOSTASIS N/A 08/16/2022   Procedure: HOT HEMOSTASIS (ARGON PLASMA COAGULATION/BICAP);  Surgeon: Lemar Lofty., MD;  Location: [facility] ENDOSCOPY;  Service: Gastroenterology;  Laterality: N/A;   HOT HEMOSTASIS N/A 10/31/2022   Procedure: HOT HEMOSTASIS (ARGON PLASMA COAGULATION/BICAP);  Surgeon: Beverley Fiedler, MD;  Location: [facility] ENDOSCOPY;  Service: Gastroenterology;  Laterality: N/A;   INSERTION OF DIALYSIS CATHETER Right 08/14/2022   Procedure: INSERTION OF TUNNELED DIALYSIS CATHETER USING PALINDROME CATHETER KIT 19CM;  Surgeon: Leonie Douglas, MD;  Location[facility] OR;  Service: Vascular;  Laterality: Right;   POLYPECTOMY  08/16/2022   Procedure: POLYPECTOMY;  Surgeon: Lemar Lofty., MD;  Location: [facility] ENDOSCOPY;  Service: Gastroenterology;;   SUBMUCOSAL TATTOO INJECTION  08/16/2022   Procedure: SUBMUCOSAL TATTOO INJECTION;  Surgeon: Lemar Lofty., MD;  Location: [facility] ENDOSCOPY;  Service: Gastroenterology;;   UPPER  GASTROINTESTINAL ENDOSCOPY      Social History:   reports that she has been smoking cigarettes. She has been exposed to tobacco smoke. She has never used smokeless tobacco. She reports that she does not currently use alcohol. She reports current drug use. Drug: Marijuana.  Allergies  Allergen Reactions   Zestril [Lisinopril] Other (See Comments)    Hyperkalemia    Family History  Problem Relation Age of Onset   Diabetes Father    Colon cancer Neg Hx    Esophageal cancer Neg Hx    Rectal cancer Neg Hx    Stomach cancer Neg Hx      Prior to Admission medications   Medication Sig Start Date End Date Taking? Authorizing Provider  albuterol (VENTOLIN HFA) 108 (90 Base) MCG/ACT inhaler Inhale 2 puffs into the lungs every 6 (six) hours as needed for wheezing or shortness of breath. Patient taking differently: Inhale 3 puffs into the lungs every 6 (six) hours as needed for wheezing or shortness of breath. 02/01/23  Yes Ghimire, Werner Lean, MD  multivitamin (RENA-VIT) TABS tablet Take 1 tablet by mouth at bedtime. 02/01/23  Yes Ghimire, Werner Lean, MD  acetaminophen (TYLENOL) 500 MG tablet Take 2 tablets (1,000 mg total) by mouth 2 (two) times daily as needed for moderate pain (pain score  4-6), fever or headache. 02/01/23   Ghimire, Werner Lean, MD  atorvastatin (LIPITOR) 10 MG tablet Take 1 tablet (10 mg total) by mouth daily. 02/01/23   Ghimire, Werner Lean, MD  busPIRone (BUSPAR) 10 MG tablet Take 1 tablet (10 mg total) by mouth 2 (two) times daily. 02/10/23   Anders Simmonds, PA-C  ferrous sulfate 325 (65 FE) MG tablet Take 1 tablet (325 mg total) by mouth daily with breakfast. 02/01/23   Ghimire, Werner Lean, MD  Fluticasone-Umeclidin-Vilant (TRELEGY ELLIPTA) 100-62.5-25 MCG/ACT AEPB Inhale 1 puff into the lungs daily. 02/01/23   Ghimire, Werner Lean, MD  gabapentin (NEURONTIN) 100 MG capsule Take 1 capsule (100 mg total) by mouth 3 (three) times daily. 02/01/23   Ghimire, Werner Lean, MD   hydrOXYzine (ATARAX) 25 MG tablet Take 1 tablet (25 mg total) by mouth 3 (three) times daily as needed for anxiety or itching. 04/23/23   [name], MD  ipratropium-albuterol (DUONEB) 0.5-2.5 (3) MG/3ML SOLN Take 3 mLs by nebulization every 6 (six) hours as needed (shortness of breath and wheezing). 05/01/23   [name], MD  pantoprazole (PROTONIX) 40 MG tablet Take 1 tablet (40mg ) by mouth daily 02/01/23   Ghimire, Werner Lean, MD  sodium bicarbonate 650 MG tablet Take 1 tablet (650 mg total) by mouth 3 (three) times daily. 02/01/23   Ghimire, Werner Lean, MD  torsemide (DEMADEX) 100 MG tablet Take 1 tablet (100 mg total) by mouth daily. 02/01/23   Ghimire, Werner Lean, MD  traZODone (DESYREL) 50 MG tablet Take 1 tablet (50 mg total) by mouth at bedtime as needed for sleep. 02/01/23   Maretta Bees, MD    Physical Exam: Vitals:   05/08/23 2237 05/08/23 2330 05/09/23 0027 05/09/23 0045  BP:  103/61 (!) 123/54 (!) 126/55  Pulse:  85 83 90  Resp: 20 18 15 18   Temp:   98.4 F (36.9 C) 98.5 F (36.9 C)  TempSrc:   Oral Oral  SpO2:  100% 97% 100%    Constitutional: NAD, no diaphoresis   Eyes: PERTLA, lids and conjunctivae normal ENMT: Mucous membranes are moist. Posterior pharynx clear of any exudate or lesions.   Neck: supple, no masses  Respiratory: no wheezing. Speaking full sentences. No accessory muscle use.  Cardiovascular: S1 & S2 heard, regular rate and rhythm. Trace LE edema. JVD. Abdomen: no tenderness, soft. Bowel sounds active.  Musculoskeletal: no clubbing / cyanosis. No joint deformity upper and lower extremities.   Skin: no significant rashes, lesions, ulcers. Warm, dry, well-perfused. Neurologic: CN 2-12 grossly intact. Moving all extremities. Alert and oriented.  Psychiatric: Calm. Cooperative.    Labs and Imaging on Admission: I have personally reviewed following labs and imaging studies  CBC: Recent Labs  Lab 05/08/23 2240  WBC 4.1  HGB 4.1*   HCT 12.6*  MCV 104.1*  PLT 210   Basic Metabolic Panel: Recent Labs  Lab 05/08/23 2240  NA 139  K 3.8  CL 111  CO2 13*  GLUCOSE 111*  BUN 84*  CREATININE 5.31*  CALCIUM 8.1*   GFR: CrCl cannot be calculated (Unknown ideal weight.). Liver Function Tests: Recent Labs  Lab 05/08/23 2240  AST 73*  ALT 67*  ALKPHOS 83  BILITOT 0.4  PROT 5.6*  ALBUMIN 2.7*   Recent Labs  Lab 05/08/23 2240  LIPASE 40   No results for input(s): "AMMONIA" in the last 168 hours. Coagulation Profile: Recent Labs  Lab 05/08/23 2317  INR 1.1  Cardiac Enzymes: No results for input(s): "CKTOTAL", "CKMB", "CKMBINDEX", "TROPONINI" in the last 168 hours. BNP (last 3 results) No results for input(s): "PROBNP" in the last 8760 hours. HbA1C: No results for input(s): "HGBA1C" in the last 72 hours. CBG: No results for input(s): "GLUCAP" in the last 168 hours. Lipid Profile: No results for input(s): "CHOL", "HDL", "LDLCALC", "TRIG", "CHOLHDL", "LDLDIRECT" in the last 72 hours. Thyroid Function Tests: No results for input(s): "TSH", "T4TOTAL", "FREET4", "T3FREE", "THYROIDAB" in the last 72 hours. Anemia Panel: No results for input(s): "VITAMINB12", "FOLATE", "FERRITIN", "TIBC", "IRON", "RETICCTPCT" in the last 72 hours. Urine analysis:    Component Value Date/Time   COLORURINE YELLOW 04/16/2023 0200   APPEARANCEUR HAZY (A) 04/16/2023 0200   LABSPEC 1.009 04/16/2023 0200   PHURINE 6.0 04/16/2023 0200   GLUCOSEU 50 (A) 04/16/2023 0200   HGBUR SMALL (A) 04/16/2023 0200   BILIRUBINUR NEGATIVE 04/16/2023 0200   KETONESUR NEGATIVE 04/16/2023 0200   PROTEINUR >=300 (A) 04/16/2023 0200   UROBILINOGEN 0.2 03/02/2010 2027   NITRITE NEGATIVE 04/16/2023 0200   LEUKOCYTESUR MODERATE (A) 04/16/2023 0200   Sepsis Labs: @LABRCNTIP (procalcitonin:4,lacticidven:4) )No results found for this or any previous visit (from the past 240 hours).   Radiological Exams on Admission: DG Chest Port 1  View Result Date: 05/08/2023 CLINICAL DATA:  Chest pain. EXAM: PORTABLE CHEST 1 VIEW COMPARISON:  Radiograph 04/15/2023, CT 11/15/2021 FINDINGS: Chronic volume loss in the right hemithorax. Cardiomegaly. Unchanged mediastinal contours. Small bilateral pleural effusions. Vascular congestion without definite pulmonary edema. No pneumothorax. IMPRESSION: 1. Cardiomegaly with vascular congestion and small bilateral pleural effusions. 2. Chronic volume loss in the right hemithorax. Electronically Signed   By: Narda Rutherford M.D.   On: 05/08/2023 22:57    EKG: Independently reviewed. Sinus rhythm.   Assessment/Plan   1. GI bleeding; symptomatic anemia  - Gross melena on exam reported by ED PA; initial Hgb 4.1 (8.8 on February 2nd); she is hemodynamically stable  - Continue IV PPI, trend H&H, keep NPO, follow-up on GI recommendations   2. CKD V; metabolic acidosis; hypervolemia  - Restrict fluids, renally-dose medications, continue torsemide and bicarbonate   3. COPD  - Not in exacerbation  - ICS-LAMA-LABA and as-needed SAMA-SABA    4. Depression, anxiety  - Continue Buspar, trazodone, and as-needed hydroxyzine    5. Chronic pain  - Continue gabapentin   6. Substance abuse  - Patient denies     DVT prophylaxis: SCDs  Code Status: DNR Level of Care: Level of care: Progressive Family Communication: None present   Disposition Plan:  Patient is from: home  Anticipated d/c is to: TBD Anticipated d/c date is: 05/13/23  Patient currently: pending stable H&H Consults called: GI  Admission status: Inpatient     Briscoe Deutscher, MD [facility]  05/09/2023, 1:29 AM

## 2023-05-09 NOTE — Transfer of Care (Signed)
Immediate Anesthesia Transfer of Care Note  Patient: [name]  Procedure(s) Performed: ENTEROSCOPY HOT HEMOSTASIS (ARGON PLASMA COAGULATION/BICAP)  Patient Location: PACU  Anesthesia Type:MAC  Level of Consciousness: drowsy  Airway & Oxygen Therapy: Patient Spontanous Breathing and Patient connected to nasal cannula oxygen  Post-op Assessment: Report given to RN and Post -op Vital signs reviewed and stable  Post vital signs: Reviewed and stable  Last Vitals:  Vitals Value Taken Time  BP 117/57 05/09/23 1501  Temp    Pulse 95 05/09/23 1505  Resp 16 05/09/23 1505  SpO2 95 % 05/09/23 1505  Vitals shown include unfiled device data.  Last Pain:  Vitals:   05/09/23 1339  TempSrc: Temporal  PainSc: 8          Complications: No notable events documented.
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
## 2023-05-09 NOTE — Op Note (Signed)
[facility] Patient Name: [name] Procedure Date : 05/09/2023 MRN: [id number] Attending MD: Particia Lather , , [id number] Date of Birth: 18-Jul-1960 CSN: [id number] Age: 63 Admit Type: Inpatient Procedure:                Small bowel enteroscopy Indications:              Anemia, Melena Providers:                Madelyn Brunner" Golden Hurter, RN, Salley Scarlet, Technician Referring MD:             Hospitalist team Medicines:                Monitored Anesthesia Care Complications:            No immediate complications. Estimated Blood Loss:     Estimated blood loss was minimal. Procedure:                Pre-Anesthesia Assessment:                           - Prior to the procedure, a History and Physical                            was performed, and patient medications and                            allergies were reviewed. The patient's tolerance of                            previous anesthesia was also reviewed. The risks                            and benefits of the procedure and the sedation                            options and risks were discussed with the patient.                            All questions were answered, and informed consent                            was obtained. Prior Anticoagulants: The patient has                            taken no anticoagulant or antiplatelet agents                            except for aspirin. ASA Grade Assessment: III - A                            patient with severe systemic disease. After  reviewing the risks and benefits, the patient was                            deemed in satisfactory condition to undergo the                            procedure.                           After obtaining informed consent, the endoscope was                            passed under direct vision. Throughout the                            procedure, the patient's blood  pressure, pulse, and                            oxygen saturations were monitored continuously. The                            [id number]-HQ190TL ([id number] Olympus peds colonoscope was                            introduced through the mouth and advanced to the                            proximal jejunum. The small bowel enteroscopy was                            accomplished without difficulty. The patient                            tolerated the procedure well. Scope In: Scope Out: Findings:      A non-obstructing Schatzki ring was found at the gastroesophageal       junction.      A hiatal hernia was present.      Localized inflammation characterized by congestion (edema), erythema and       linear erosions was found in the gastric antrum.      Two 3 to 6 mm angioectasias with no bleeding were found in the gastric       body. Coagulation for bleeding prevention using argon plasma at 2       liters/minute and 20 watts was successful.      Two angioectasias with no bleeding were found in the duodenal bulb and       in the third portion of the duodenum. Coagulation for bleeding       prevention using argon plasma at 1 liter/minute and 20 watts was       successful.      Two angioectasias with no bleeding were found in the proximal jejunum.       Coagulation for bleeding prevention using argon plasma at 1 liter/minute       and 20 watts was successful. Impression:               - Non-obstructing Schatzki ring.                           -  Hiatal hernia.                           - Gastritis.                           - Two non-bleeding angioectasias in the stomach.                            Treated with argon plasma coagulation (APC).                           - Two non-bleeding angioectasias in the duodenum.                            Treated with argon plasma coagulation (APC).                           - Two non-bleeding angioectasias in the jejunum.                            Treated with  argon plasma coagulation (APC).                           - No specimens collected. Recommendation:           - Return patient to hospital ward for ongoing care.                           - Some hematin was seen in the stomach. It is                            suspected that the patient had bleeding due to                            angioectasias and/or erosive gastritis.                           - Use a proton pump inhibitor PO BID for 10 weeks.                           - Avoid NSAID use.                           - The findings and recommendations were discussed                            with the patient. Procedure Code(s):        --- Professional ---                           [phone], Small intestinal endoscopy, enteroscopy                            beyond second portion of duodenum, not including  ileum; with control of bleeding (eg, injection,                            bipolar cautery, unipolar cautery, laser, heater                            probe, stapler, plasma coagulator) Diagnosis Code(s):        --- Professional ---                           K22.2, Esophageal obstruction                           K44.9, Diaphragmatic hernia without obstruction or                            gangrene                           K29.70, Gastritis, unspecified, without bleeding                           K31.819, Angiodysplasia of stomach and duodenum                            without bleeding                           K55.20, Angiodysplasia of colon without hemorrhage                           D64.9, Anemia, unspecified                           K92.1, Melena (includes Hematochezia) CPT copyright 2022 [employer]. All rights reserved. The codes documented in this report are preliminary and upon coder review may  be revised to meet current compliance requirements. Dr Particia Lather "[name]" [place],  05/09/2023 3:22:16 PM Number of Addenda: 0

## 2023-05-09 NOTE — Interval H&P Note (Signed)
History and Physical Interval Note:  05/09/2023 1:32 PM  [name]  has presented today for surgery, with the diagnosis of GI bleed, anemia, history of small bowel arterial  venous malformations.  The various methods of treatment have been discussed with the patient and family. After consideration of risks, benefits and other options for treatment, the patient has consented to  Procedure(s): ENTEROSCOPY (N/A) as a surgical intervention.  The patient's history has been reviewed, patient examined, no change in status, stable for surgery.  I have reviewed the patient's chart and labs.  Questions were answered to the patient's satisfaction.     [name]

## 2023-05-09 NOTE — Anesthesia Preprocedure Evaluation (Signed)
Anesthesia Evaluation  Patient identified by MRN, date of birth, ID band Patient awake    Reviewed: Allergy & Precautions, H&P , NPO status , Patient's Chart, lab work & pertinent test results  Airway Mallampati: II   Neck ROM: full    Dental   Pulmonary asthma , COPD, Current Smoker and Patient abstained from smoking.   breath sounds clear to auscultation       Cardiovascular hypertension,  Rhythm:regular Rate:Normal     Neuro/Psych  PSYCHIATRIC DISORDERS Anxiety Depression       GI/Hepatic PUD,GERD  ,,(+)     substance abuse  cocaine use and marijuana use, Hepatitis -, CGi bleeding   Endo/Other    Renal/GU Renal InsufficiencyRenal disease     Musculoskeletal  (+) Arthritis ,    Abdominal   Peds  Hematology  (+) Blood dyscrasia, anemia Hemoglobin 7.6   Anesthesia Other Findings   Reproductive/Obstetrics                             Anesthesia Physical Anesthesia Plan  ASA: 3  Anesthesia Plan: MAC   Post-op Pain Management:    Induction: Intravenous  PONV Risk Score and Plan: 1 and Propofol infusion and Treatment may vary due to age or medical condition  Airway Management Planned: Nasal Cannula  Additional Equipment:   Intra-op Plan:   Post-operative Plan:   Informed Consent: I have reviewed the patients History and Physical, chart, labs and discussed the procedure including the risks, benefits and alternatives for the proposed anesthesia with the patient or authorized representative who has indicated his/her understanding and acceptance.     Dental advisory given  Plan Discussed with: CRNA, Anesthesiologist and Surgeon  Anesthesia Plan Comments:        Anesthesia Quick Evaluation
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
## 2023-05-09 NOTE — ED Notes (Signed)
Please update boyfriend

## 2023-05-09 NOTE — Progress Notes (Signed)
  PROGRESS NOTE  Patient admitted earlier this morning. See H&P.   Patient admitted with generalized pain, nausea and fatigue.  She was found to have anemia with positive FOBT.  She received 2 unit packed red blood cells.  GI consulted.  Patient evaluated in the emergency department.  She denies any hematemesis or hematochezia.  Reports dark stool, which she had attributed to taking iron supplements.  She does have history of recurrent GI bleeds, severe esophagitis, peptic ulcer disease, gastric/small bowel AVMs.  GI consulted, planning for EGD IV PPI Demand ischemia in setting of GI bleed CKD stage V, declined dialysis in the past.  Makes urine. Positive for cocaine    Status is: Inpatient Remains inpatient appropriate because: EGD planned for today.  Monitor H&H.   Noralee Stain, DO [facility] 05/09/2023, 12:24 PM  Available via Epic secure chat 7am-7pm After these hours, please refer to coverage provider listed on [email or web address]

## 2023-05-10 DIAGNOSIS — K5900 Constipation, unspecified: Secondary | ICD-10-CM

## 2023-05-10 DIAGNOSIS — R140 Abdominal distension (gaseous): Secondary | ICD-10-CM | POA: Diagnosis not present

## 2023-05-10 DIAGNOSIS — D649 Anemia, unspecified: Secondary | ICD-10-CM | POA: Diagnosis not present

## 2023-05-10 DIAGNOSIS — N185 Chronic kidney disease, stage 5: Secondary | ICD-10-CM | POA: Diagnosis not present

## 2023-05-10 LAB — COMPREHENSIVE METABOLIC PANEL
ALT: 65 U/L — ABNORMAL HIGH (ref 0–44)
AST: 86 U/L — ABNORMAL HIGH (ref 15–41)
Albumin: 2.5 g/dL — ABNORMAL LOW (ref 3.5–5.0)
Alkaline Phosphatase: 86 U/L (ref 38–126)
Anion gap: 18 — ABNORMAL HIGH (ref 5–15)
BUN: 79 mg/dL — ABNORMAL HIGH (ref 8–23)
CO2: 14 mmol/L — ABNORMAL LOW (ref 22–32)
Calcium: 8.9 mg/dL (ref 8.9–10.3)
Chloride: 109 mmol/L (ref 98–111)
Creatinine, Ser: 5.03 mg/dL — ABNORMAL HIGH (ref 0.44–1.00)
GFR, Estimated: 9 mL/min — ABNORMAL LOW (ref >60)
Glucose, Bld: 88 mg/dL (ref 70–99)
Potassium: 4.1 mmol/L (ref 3.5–5.1)
Sodium: 141 mmol/L (ref 135–145)
Total Bilirubin: 0.6 mg/dL (ref 0.0–1.2)
Total Protein: 5.3 g/dL — ABNORMAL LOW (ref 6.5–8.1)

## 2023-05-10 LAB — CBC
HCT: 22.2 % — ABNORMAL LOW (ref 36.0–46.0)
Hemoglobin: 7.4 g/dL — ABNORMAL LOW (ref 12.0–15.0)
MCH: 30.2 pg (ref 26.0–34.0)
MCHC: 33.3 g/dL (ref 30.0–36.0)
MCV: 90.6 fL (ref 80.0–100.0)
Platelets: 197 10*3/uL (ref 150–400)
RBC: 2.45 MIL/uL — ABNORMAL LOW (ref 3.87–5.11)
RDW: 20.2 % — ABNORMAL HIGH (ref 11.5–15.5)
WBC: 4 10*3/uL (ref 4.0–10.5)
nRBC: 0 % (ref 0.0–0.2)

## 2023-05-10 LAB — PREPARE RBC (CROSSMATCH)

## 2023-05-10 MED ORDER — FUROSEMIDE 10 MG/ML IJ SOLN
20.0000 mg | Freq: Once | INTRAMUSCULAR | Status: AC
  Administered 2023-05-10: 20 mg via INTRAVENOUS
  Filled 2023-05-10: qty 2

## 2023-05-10 MED ORDER — SODIUM CHLORIDE 0.9% IV SOLUTION: Freq: Once | INTRAVENOUS | Status: AC

## 2023-05-10 MED ORDER — SODIUM BICARBONATE 650 MG PO TABS
1300.0000 mg | ORAL_TABLET | Freq: Three times a day (TID) | ORAL | Status: DC
  Administered 2023-05-10 – 2023-05-13 (×9): 1300 mg via ORAL
  Filled 2023-05-10 (×9): qty 2

## 2023-05-10 MED ORDER — NICOTINE 21 MG/24HR TD PT24
21.0000 mg | MEDICATED_PATCH | Freq: Every day | TRANSDERMAL | Status: DC
  Administered 2023-05-10 – 2023-05-13 (×4): 21 mg via TRANSDERMAL
  Filled 2023-05-10 (×4): qty 1

## 2023-05-10 MED ORDER — SODIUM CHLORIDE 0.9 % IV SOLN
2.0000 g | INTRAVENOUS | Status: DC
  Administered 2023-05-10 – 2023-05-12 (×3): 2 g via INTRAVENOUS
  Filled 2023-05-10 (×3): qty 20

## 2023-05-10 MED ORDER — METHYLPREDNISOLONE SODIUM SUCC 125 MG IJ SOLR
80.0000 mg | INTRAMUSCULAR | Status: DC
  Administered 2023-05-10 – 2023-05-11 (×2): 80 mg via INTRAVENOUS
  Filled 2023-05-10 (×2): qty 2

## 2023-05-10 NOTE — Progress Notes (Addendum)
 PROGRESS NOTE    [name]  JYN:[id number] DOB: 12-18-1960 DOA: 05/08/2023 PCP: [name], MD    Chief Complaint  Patient presents with   Chest Pain    Brief Narrative:   [name] is a 63 y.o. 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 - Upon arrival to the ED, he was found with hemoglobin of 4.1, positive FOBT, received 3 units PRBC transfusion so far, GI were consulted, she went for endoscopy yesterday, which was significant for gastritis, multiple angiectasia's status post ERCP.       Assessment & Plan:   Principal Problem:   Symptomatic anemia Active Problems:   Essential hypertension   Generalized anxiety disorder   Polysubstance abuse (HCC)   COPD (chronic obstructive pulmonary disease) (HCC)   Chronic pain syndrome   Chronic kidney disease (CKD), stage V (HCC)    Acute blood loss anemia Upper GI bleed Gastritis, multiple angiectasia's and stomach, duodenum, jejunum status post APC -Presents with acute blood loss anemia, melena with Hemoccult positive stool -Hemoglobin 4.1 on presentation, received 3 units PRBC transfusion, hemoglobin 7.4 this morning, still dyspneic, symptomatic, with known renal disease, will transfuse another unit PRBC. -GI input greatly appreciated, status post endoscopy 2/22, significant for nonobstructive, gastritis, with multiple angiectasia's in stomach, duodenum and jejunum, status post APC treatment -Continue with PPI twice daily time 10 weeks then daily -Avoid NSAIDs   CKD V; metabolic acidosis; hypervolemia  - Restrict fluids, renally-dose medications, continue torsemide and bicarbonate  -Will give IV Lasix today post PRBC transfusion -Bicarb level remains significantly low at 14 this morning,  so I will increase her bicarb.   COPD exacerbation -This morning she is with diminished air entry, wheezing, cough and congestion, she reports significant dyspnea, so started on steroids, continue with scheduled DuoNebs) albuterol -Significant phlegm, will start on Rocephin  Depression, anxiety  - Continue Buspar, trazodone, and as-needed hydroxyzine      Chronic pain  - Continue gabapentin   Tobacco abuse -Counseled, started on nicotine patch   Substance abuse  Cocaine abuse -Tested positive for cocaine, she was counseled.  DVT prophylaxis: SCD's Code Status: DNR Family Communication: none at bedside Disposition:   Status is: Inpatient    Consultants:  Gastroenterology   Subjective: Patient reports abdominal pain has improved, did pass some stool and gas this morning, she reports dyspnea with minimal activity, cough and congestion   Objective: Vitals:   05/10/23 0844 05/10/23 1100 05/10/23 1222 05/10/23 1300  BP:  (!) 167/68  (!) 148/64  Pulse:  80  84  Resp:  18    Temp:  98.1 F (36.7 C)  98 F (36.7 C)  TempSrc:  Oral  Oral  SpO2: 97% 97% 100% 100%  Weight:      Height:        Intake/Output Summary (Last 24 hours) at 05/10/2023 1340 Last data filed at 05/10/2023 1300 Gross per 24 hour  Intake 1430 ml  Output 0 ml  Net 1430 ml   Filed Weights   05/09/23 1339 05/10/23 0512  Weight: 54.4 kg 53.4 kg    Examination:  Awake Alert, Oriented X 3, frail, appears older than stated age Symmetrical Chest wall movement, diminished air entry bilaterally, with wheezing RRR,No Gallops,Rubs or new Murmurs, No Parasternal Heave +ve B.Sounds, Abd Soft, No  tenderness, No rebound - guarding or rigidity. No Cyanosis, Clubbing or edema, No new Rash or bruise       Data Reviewed: I have personally reviewed following labs and imaging studies  CBC: Recent Labs  Lab 05/08/23 2240 05/09/23 0507 05/09/23 1559 05/09/23 2104 05/10/23 0221  WBC 4.1  --   --   --   4.0  HGB 4.1* 7.6* 6.7* 7.4* 7.4*  HCT 12.6* 24.5* 20.0* 22.0* 22.2*  MCV 104.1*  --   --   --  90.6  PLT 210  --   --   --  197    Basic Metabolic Panel: Recent Labs  Lab 05/08/23 2240 05/10/23 0221  NA 139 141  K 3.8 4.1  CL 111 109  CO2 13* 14*  GLUCOSE 111* 88  BUN 84* 79*  CREATININE 5.31* 5.03*  CALCIUM 8.1* 8.9    GFR: Estimated Creatinine Clearance: 9.8 mL/min (A) (by C-G formula based on SCr of 5.03 mg/dL (H)).  Liver Function Tests: Recent Labs  Lab 05/08/23 2240 05/10/23 0221  AST 73* 86*  ALT 67* 65*  ALKPHOS 83 86  BILITOT 0.4 0.6  PROT 5.6* 5.3*  ALBUMIN 2.7* 2.5*    CBG: Recent Labs  Lab 05/09/23 1510  GLUCAP 147*     No results found for this or any previous visit (from the past 240 hours).       Radiology Studies: DG Abd Portable 1V Result Date: 05/09/2023 CLINICAL DATA:  Anemic with pain EXAM: PORTABLE ABDOMEN - 1 VIEW COMPARISON:  01/28/2023 FINDINGS: Right-sided pleural effusion. Nonobstructed gas pattern with moderate stool. Vascular calcifications. IMPRESSION: Nonobstructed gas pattern with moderate stool. Electronically Signed   By: Jasmine Pang M.D.   On: 05/09/2023 17:03   DG Chest Port 1 View Result Date: 05/08/2023 CLINICAL DATA:  Chest pain. EXAM: PORTABLE CHEST 1 VIEW COMPARISON:  Radiograph 04/15/2023, CT 11/15/2021 FINDINGS: Chronic volume loss in the right hemithorax. Cardiomegaly. Unchanged mediastinal contours. Small bilateral pleural effusions. Vascular congestion without definite pulmonary edema. No pneumothorax. IMPRESSION: 1. Cardiomegaly with vascular congestion and small bilateral pleural effusions. 2. Chronic volume loss in the right hemithorax. Electronically Signed   By: Narda Rutherford M.D.   On: 05/08/2023 22:57        Scheduled Meds:  amLODipine  10 mg Oral Daily   atorvastatin  10 mg Oral Daily   busPIRone  10 mg Oral BID   ferrous sulfate  325 mg Oral Q breakfast   fluticasone furoate-vilanterol  1  puff Inhalation Daily   And   umeclidinium bromide  1 puff Inhalation Daily   gabapentin  100 mg Oral TID   methylPREDNISolone (SOLU-MEDROL) injection  80 mg Intravenous Q24H   nicotine  21 mg Transdermal Daily   pantoprazole (PROTONIX) IV  40 mg Intravenous Q12H   polyethylene glycol  17 g Oral Daily   sodium bicarbonate  650 mg Oral TID   sodium chloride flush  3 mL Intravenous Q12H   torsemide  100 mg Oral Daily   Continuous Infusions:  cefTRIAXone (ROCEPHIN)  IV 2 g (05/10/23 0909)     LOS: 1 day    Huey Bienenstock, MD [facility]   To contact the attending provider between 7A-7P or the covering provider during after hours 7P-7A, please log into the web site [email or web address] and access using universal [place] password for that web site. If you do not have the password, please call the hospital operator.  05/10/2023, 1:40 PM

## 2023-05-10 NOTE — Progress Notes (Addendum)
 Gastroenterology Inpatient Follow Up    Subjective: Patient's abdominal pain is slightly better.  She was able to pass some stool and gas this morning and states that this helped with the abdominal pain.  Her stool this morning with a combination of black and brown.  Denies nausea or vomiting.  She ate a really good breakfast.  Objective: Vital signs in last 24 hours: Temp:  [97.2 F (36.2 C)-98.4 F (36.9 C)] 98.1 F (36.7 C) (02/22 0800) Pulse Rate:  [77-92] 87 (02/22 0800) Resp:  [11-24] 18 (02/22 0800) BP: (90-175)/(48-99) 172/64 (02/22 0800) SpO2:  [93 %-100 %] 97 % (02/22 0844) Weight:  [53.4 kg-54.4 kg] 53.4 kg (02/22 0512) Last BM Date : 05/09/23  Intake/Output from previous day: 02/21 0701 - 02/22 0700 In: 1190 [P.O.:240; I.V.:600; Blood:350] Out: 0  Intake/Output this shift: No intake/output data recorded.  General appearance: alert and cooperative Resp: No increased work of breathing Cardio: Regular rate GI: Mildly distended, mildly tender  Lab Results: Recent Labs    05/08/23 2240 05/09/23 0507 05/09/23 1559 05/09/23 2104 05/10/23 0221  WBC 4.1  --   --   --  4.0  HGB 4.1*   < > 6.7* 7.4* 7.4*  HCT 12.6*   < > 20.0* 22.0* 22.2*  PLT 210  --   --   --  197   < > = values in this interval not displayed.   BMET Recent Labs    05/08/23 2240 05/10/23 0221  NA 139 141  K 3.8 4.1  CL 111 109  CO2 13* 14*  GLUCOSE 111* 88  BUN 84* 79*  CREATININE 5.31* 5.03*  CALCIUM 8.1* 8.9   LFT Recent Labs    05/10/23 0221  PROT 5.3*  ALBUMIN 2.5*  AST 86*  ALT 65*  ALKPHOS 86  BILITOT 0.6   PT/INR Recent Labs    05/08/23 2317  LABPROT 14.1  INR 1.1   Hepatitis Panel No results for input(s): "HEPBSAG", "HCVAB", "HEPAIGM", "HEPBIGM" in the last 72 hours. C-Diff No results for input(s): "CDIFFTOX" in the last 72 hours.  Studies/Results: DG Abd Portable 1V Result Date: 05/09/2023 CLINICAL DATA:  Anemic with pain EXAM: PORTABLE ABDOMEN - 1  VIEW COMPARISON:  01/28/2023 FINDINGS: Right-sided pleural effusion. Nonobstructed gas pattern with moderate stool. Vascular calcifications. IMPRESSION: Nonobstructed gas pattern with moderate stool. Electronically Signed   By: Jasmine Pang M.D.   On: 05/09/2023 17:03   DG Chest Port 1 View Result Date: 05/08/2023 CLINICAL DATA:  Chest pain. EXAM: PORTABLE CHEST 1 VIEW COMPARISON:  Radiograph 04/15/2023, CT 11/15/2021 FINDINGS: Chronic volume loss in the right hemithorax. Cardiomegaly. Unchanged mediastinal contours. Small bilateral pleural effusions. Vascular congestion without definite pulmonary edema. No pneumothorax. IMPRESSION: 1. Cardiomegaly with vascular congestion and small bilateral pleural effusions. 2. Chronic volume loss in the right hemithorax. Electronically Signed   By: Narda Rutherford M.D.   On: 05/08/2023 22:57    Medications: I have reviewed the patient's current medications. Scheduled:  amLODipine  10 mg Oral Daily   atorvastatin  10 mg Oral Daily   busPIRone  10 mg Oral BID   ferrous sulfate  325 mg Oral Q breakfast   fluticasone furoate-vilanterol  1 puff Inhalation Daily   And   umeclidinium bromide  1 puff Inhalation Daily   gabapentin  100 mg Oral TID   methylPREDNISolone (SOLU-MEDROL) injection  80 mg Intravenous Q24H   nicotine  21 mg Transdermal Daily   pantoprazole (PROTONIX) IV  40 mg Intravenous Q12H   polyethylene glycol  17 g Oral Daily   sodium bicarbonate  650 mg Oral TID   sodium chloride flush  3 mL Intravenous Q12H   torsemide  100 mg Oral Daily   Continuous:  cefTRIAXone (ROCEPHIN)  IV 2 g (05/10/23 0909)   [id number] **OR** acetaminophen, fentaNYL (SUBLIMAZE) injection, hydrOXYzine, ipratropium-albuterol, traZODone  Assessment/Plan: 63 year old female with history of COPD, ESRD on HD, alcohol use, cocaine use, untreated HCV, gastric and small bowel AVMs, homelessness, PAD, and HTN presented with chest pain, nausea, melena, and abdominal  pain.  Patient had a hemoglobin drop down to 4.1 upon arrival.  SBE yesterday showed erosive gastritis, 2 angiectasia's in the gastric body that were treated with APC, and for angioectasias in the small bowel that were treated with APC.  Patient's hemoglobin today is completely stable from yesterday.  I do suspect that her GI bleed has resolved at this time.  Her KUB from yesterday showed constipation and gaseous distention, which is likely the explanation for her abdominal pain.  Patient notes that her abdominal pain has improved a bit with passing a stool.  - PPI BID for 10 weeks, then QD - Avoid NSAIDs - Continue iron supplements. - Continue MiraLAX daily.  Patient declined suppository. - Patient can get an outpatient colonoscopy for polyp surveillance.  I have requested a follow-up appointment to discuss this. -GI will sign off for now.  Please call back if any new questions arise.   LOS: 1 day   [name] 05/10/2023, 9:14 AM

## 2023-05-10 NOTE — Anesthesia Postprocedure Evaluation (Signed)
 Anesthesia Post Note  Patient: [name]  Procedure(s) Performed: ENTEROSCOPY HOT HEMOSTASIS (ARGON PLASMA COAGULATION/BICAP)     Patient location during evaluation: Endoscopy Anesthesia Type: MAC Level of consciousness: awake Pain management: pain level controlled Vital Signs Assessment: post-procedure vital signs reviewed and stable Respiratory status: spontaneous breathing, nonlabored ventilation and respiratory function stable Cardiovascular status: blood pressure returned to baseline and stable Postop Assessment: no apparent nausea or vomiting Anesthetic complications: no   No notable events documented.  Last Vitals:  Vitals:   05/10/23 0018 05/10/23 0512  BP: (!) 150/69 (!) 174/99  Pulse: 80 92  Resp: (!) 22   Temp: 36.7 C 36.8 C  SpO2: 98% 97%    Last Pain:  Vitals:   05/10/23 0512  TempSrc: Oral  PainSc:                  Catheryn Bacon Anand Tejada

## 2023-05-10 NOTE — Plan of Care (Signed)

## 2023-05-11 DIAGNOSIS — J441 Chronic obstructive pulmonary disease with (acute) exacerbation: Secondary | ICD-10-CM

## 2023-05-11 DIAGNOSIS — D649 Anemia, unspecified: Secondary | ICD-10-CM | POA: Diagnosis not present

## 2023-05-11 DIAGNOSIS — K921 Melena: Secondary | ICD-10-CM

## 2023-05-11 LAB — TYPE AND SCREEN
ABO/RH(D): A POS
Antibody Screen: NEGATIVE
Unit division: 0
Unit division: 0
Unit division: 0
Unit division: 0

## 2023-05-11 LAB — BASIC METABOLIC PANEL
Anion gap: 16 — ABNORMAL HIGH (ref 5–15)
BUN: 75 mg/dL — ABNORMAL HIGH (ref 8–23)
CO2: 13 mmol/L — ABNORMAL LOW (ref 22–32)
Calcium: 8.6 mg/dL — ABNORMAL LOW (ref 8.9–10.3)
Chloride: 107 mmol/L (ref 98–111)
Creatinine, Ser: 5 mg/dL — ABNORMAL HIGH (ref 0.44–1.00)
GFR, Estimated: 9 mL/min — ABNORMAL LOW (ref >60)
Glucose, Bld: 102 mg/dL — ABNORMAL HIGH (ref 70–99)
Potassium: 4.2 mmol/L (ref 3.5–5.1)
Sodium: 136 mmol/L (ref 135–145)

## 2023-05-11 LAB — CBC
HCT: 25.6 % — ABNORMAL LOW (ref 36.0–46.0)
Hemoglobin: 8.7 g/dL — ABNORMAL LOW (ref 12.0–15.0)
MCH: 30.1 pg (ref 26.0–34.0)
MCHC: 34 g/dL (ref 30.0–36.0)
MCV: 88.6 fL (ref 80.0–100.0)
Platelets: 206 10*3/uL (ref 150–400)
RBC: 2.89 MIL/uL — ABNORMAL LOW (ref 3.87–5.11)
RDW: 19.6 % — ABNORMAL HIGH (ref 11.5–15.5)
WBC: 4.8 10*3/uL (ref 4.0–10.5)
nRBC: 0 % (ref 0.0–0.2)

## 2023-05-11 LAB — BPAM RBC
Blood Product Expiration Date: 202503172359
Blood Product Expiration Date: 202503172359
Blood Product Expiration Date: 202503172359
Blood Product Expiration Date: 202503212359
ISSUE DATE / TIME: 202502210016
ISSUE DATE / TIME: 202502210239
ISSUE DATE / TIME: 202502211735
ISSUE DATE / TIME: 202502221438
Unit Type and Rh: 6200
Unit Type and Rh: 6200
Unit Type and Rh: 6200
Unit Type and Rh: 6200

## 2023-05-11 MED ORDER — PROSOURCE PLUS PO LIQD
30.0000 mL | Freq: Two times a day (BID) | ORAL | Status: DC
  Administered 2023-05-11 – 2023-05-13 (×4): 30 mL via ORAL
  Filled 2023-05-11 (×4): qty 30

## 2023-05-11 MED ORDER — METHYLPREDNISOLONE SODIUM SUCC 40 MG IJ SOLR
40.0000 mg | INTRAMUSCULAR | Status: DC
  Administered 2023-05-12: 40 mg via INTRAVENOUS
  Filled 2023-05-11: qty 1

## 2023-05-11 MED ORDER — GUAIFENESIN ER 600 MG PO TB12
600.0000 mg | ORAL_TABLET | Freq: Two times a day (BID) | ORAL | Status: DC
  Administered 2023-05-11 – 2023-05-13 (×5): 600 mg via ORAL
  Filled 2023-05-11 (×5): qty 1

## 2023-05-11 MED ORDER — BUTALBITAL-APAP-CAFFEINE 50-325-40 MG PO TABS
1.0000 | ORAL_TABLET | Freq: Four times a day (QID) | ORAL | Status: DC | PRN
Start: 2023-05-11 — End: 2023-05-13
  Administered 2023-05-11 (×2): 1 via ORAL
  Filled 2023-05-11 (×2): qty 1

## 2023-05-11 MED ORDER — STERILE WATER FOR INJECTION IV SOLN
INTRAVENOUS | Status: AC
  Filled 2023-05-11: qty 150

## 2023-05-11 MED ORDER — PANTOPRAZOLE SODIUM 40 MG PO TBEC
40.0000 mg | DELAYED_RELEASE_TABLET | Freq: Two times a day (BID) | ORAL | Status: DC
  Administered 2023-05-11 – 2023-05-13 (×4): 40 mg via ORAL
  Filled 2023-05-11 (×4): qty 1

## 2023-05-11 NOTE — Progress Notes (Signed)
 PROGRESS NOTE    [name]  ZOX:[id number] DOB: 12-09-1960 DOA: 05/08/2023 PCP: [name], MD    Chief Complaint  Patient presents with   Chest Pain    Brief Narrative:   [name] is a 63 y.o. 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 - Upon arrival to the ED, he was found with hemoglobin of 4.1, positive FOBT, received 3 units PRBC transfusion so far, GI were consulted, she went for endoscopy yesterday, which was significant for gastritis, multiple angiectasia's status post ERCP.       Assessment & Plan:   Principal Problem:   Symptomatic anemia Active Problems:   Essential hypertension   Generalized anxiety disorder   Polysubstance abuse (HCC)   COPD (chronic obstructive pulmonary disease) (HCC)   Chronic pain syndrome   Chronic kidney disease (CKD), stage V (HCC)    Acute blood loss anemia Upper GI bleed Gastritis, multiple angiectasia's and stomach, duodenum, jejunum status post APC -Presents with acute blood loss anemia, melena with Hemoccult positive stool -Hemoglobin 4.1 on presentation, received 3 units PRBC transfusion, hemoglobin 7.4 this morning, still dyspneic, symptomatic, with known renal disease, will transfuse another unit PRBC. -GI input greatly appreciated, status post endoscopy 2/22, significant for nonobstructive, gastritis, with multiple angiectasia's in stomach, duodenum and jejunum, status post APC treatment -Continue with PPI twice daily time 10 weeks then daily -Avoid NSAIDs   CKD V; metabolic acidosis; hypervolemia  - Restrict fluids, renally-dose medications, continue torsemide and bicarbonate  -As needed IV Lasix posttransfusion -Bicarb level remains significantly low, actually trending down despite  increasing p.o. sodium bicarb, so we will keep on bicarb drip over next 24 hours    COPD exacerbation -This morning she is with diminished air entry, wheezing, cough and congestion, she reports significant dyspnea, so started on steroids, continue with scheduled DuoNebs) albuterol -Significant phlegm, will start on Rocephin, started on Mucinex  Depression, anxiety  - Continue Buspar, trazodone, and as-needed hydroxyzine      Chronic pain  - Continue gabapentin   Tobacco abuse -Counseled, started on nicotine patch   Substance abuse  Cocaine abuse -Tested positive for cocaine, she was counseled.  Failure to thrive Routine malnutrition -Started on supplement  DVT prophylaxis: SCD's Code Status: DNR Family Communication: none at bedside Disposition:   Status is: Inpatient    Consultants:  Gastroenterology   Subjective:  Reports cough, congestion, reports currently a cough more productive after doing incentive spirometer and flutter valve, does report headache.  Objective: Vitals:   05/11/23 0449 05/11/23 0849 05/11/23 0851 05/11/23 1012  BP:    (!) 150/67  Pulse:    89  Resp:    (!) 22  Temp:    (!) 97.4 F (36.3 C)  TempSrc:    Oral  SpO2:  98% 100% 98%  Weight: 54.6 kg     Height:        Intake/Output Summary (Last 24 hours) at 05/11/2023 1209 Last data filed at 05/10/2023 1800 Gross per 24 hour  Intake 643.75 ml  Output 300 ml  Net 343.75 ml   Filed Weights   05/09/23 1339 05/10/23 0512 05/11/23 0449  Weight: 54.4 kg 53.4 kg 54.6 kg    Examination:  Awake Alert, Oriented X 3, extremely frail, appears older than stated age Symmetrical Chest wall movement,  wheezing, but improved air entry, has some congestion in upper airways RRR,No Gallops,Rubs or new Murmurs, No Parasternal Heave +ve B.Sounds, Abd Soft, No tenderness, No rebound - guarding or rigidity. No Cyanosis, Clubbing or edema, No new Rash or bruise        Data Reviewed: I have  personally reviewed following labs and imaging studies  CBC: Recent Labs  Lab 05/08/23 2240 05/09/23 0507 05/09/23 1559 05/09/23 2104 05/10/23 0221 05/11/23 0539  WBC 4.1  --   --   --  4.0 4.8  HGB 4.1* 7.6* 6.7* 7.4* 7.4* 8.7*  HCT 12.6* 24.5* 20.0* 22.0* 22.2* 25.6*  MCV 104.1*  --   --   --  90.6 88.6  PLT 210  --   --   --  197 206    Basic Metabolic Panel: Recent Labs  Lab 05/08/23 2240 05/10/23 0221 05/11/23 0539  NA 139 141 136  K 3.8 4.1 4.2  CL 111 109 107  CO2 13* 14* 13*  GLUCOSE 111* 88 102*  BUN 84* 79* 75*  CREATININE 5.31* 5.03* 5.00*  CALCIUM 8.1* 8.9 8.6*    GFR: Estimated Creatinine Clearance: 10.1 mL/min (A) (by C-G formula based on SCr of 5 mg/dL (H)).  Liver Function Tests: Recent Labs  Lab 05/08/23 2240 05/10/23 0221  AST 73* 86*  ALT 67* 65*  ALKPHOS 83 86  BILITOT 0.4 0.6  PROT 5.6* 5.3*  ALBUMIN 2.7* 2.5*    CBG: Recent Labs  Lab 05/09/23 1510  GLUCAP 147*     No results found for this or any previous visit (from the past 240 hours).       Radiology Studies: DG Abd Portable 1V Result Date: 05/09/2023 CLINICAL DATA:  Anemic with pain EXAM: PORTABLE ABDOMEN - 1 VIEW COMPARISON:  01/28/2023 FINDINGS: Right-sided pleural effusion. Nonobstructed gas pattern with moderate stool. Vascular calcifications. IMPRESSION: Nonobstructed gas pattern with moderate stool. Electronically Signed   By: Jasmine Pang M.D.   On: 05/09/2023 17:03        Scheduled Meds:  amLODipine  10 mg Oral Daily   atorvastatin  10 mg Oral Daily   busPIRone  10 mg Oral BID   ferrous sulfate  325 mg Oral Q breakfast   fluticasone furoate-vilanterol  1 puff Inhalation Daily   And   umeclidinium bromide  1 puff Inhalation Daily   gabapentin  100 mg Oral TID   guaiFENesin  600 mg Oral BID   methylPREDNISolone (SOLU-MEDROL) injection  80 mg Intravenous Q24H   nicotine  21 mg Transdermal Daily   pantoprazole (PROTONIX) IV  40 mg Intravenous Q12H    polyethylene glycol  17 g Oral Daily   sodium bicarbonate  1,300 mg Oral TID   sodium chloride flush  3 mL Intravenous Q12H   torsemide  100 mg Oral Daily   Continuous Infusions:  cefTRIAXone (ROCEPHIN)  IV 2 g (05/11/23 1027)   sodium bicarbonate 150 mEq in sterile water 1,150 mL infusion       LOS: 2 days    Huey Bienenstock, MD [facility]   To contact the attending provider between 7A-7P or the covering provider during after hours 7P-7A, please log into the web site [email or web address] and access using universal [place] password for that web site. If you do not have the password, please call the hospital operator.  05/11/2023, 12:09 PM

## 2023-05-11 NOTE — Plan of Care (Signed)

## 2023-05-12 ENCOUNTER — Telehealth: Payer: Self-pay

## 2023-05-12 ENCOUNTER — Encounter (HOSPITAL_COMMUNITY): Payer: Self-pay | Admitting: Internal Medicine

## 2023-05-12 DIAGNOSIS — D649 Anemia, unspecified: Secondary | ICD-10-CM | POA: Diagnosis not present

## 2023-05-12 DIAGNOSIS — J441 Chronic obstructive pulmonary disease with (acute) exacerbation: Secondary | ICD-10-CM | POA: Diagnosis not present

## 2023-05-12 LAB — CBC
HCT: 26.6 % — ABNORMAL LOW (ref 36.0–46.0)
Hemoglobin: 9.2 g/dL — ABNORMAL LOW (ref 12.0–15.0)
MCH: 30.9 pg (ref 26.0–34.0)
MCHC: 34.6 g/dL (ref 30.0–36.0)
MCV: 89.3 fL (ref 80.0–100.0)
Platelets: 234 10*3/uL (ref 150–400)
RBC: 2.98 MIL/uL — ABNORMAL LOW (ref 3.87–5.11)
RDW: 19.8 % — ABNORMAL HIGH (ref 11.5–15.5)
WBC: 5.6 10*3/uL (ref 4.0–10.5)
nRBC: 0 % (ref 0.0–0.2)

## 2023-05-12 LAB — BASIC METABOLIC PANEL
Anion gap: 15 (ref 5–15)
BUN: 86 mg/dL — ABNORMAL HIGH (ref 8–23)
CO2: 15 mmol/L — ABNORMAL LOW (ref 22–32)
Calcium: 7.8 mg/dL — ABNORMAL LOW (ref 8.9–10.3)
Chloride: 106 mmol/L (ref 98–111)
Creatinine, Ser: 5.37 mg/dL — ABNORMAL HIGH (ref 0.44–1.00)
GFR, Estimated: 8 mL/min — ABNORMAL LOW (ref >60)
Glucose, Bld: 104 mg/dL — ABNORMAL HIGH (ref 70–99)
Potassium: 4.1 mmol/L (ref 3.5–5.1)
Sodium: 136 mmol/L (ref 135–145)

## 2023-05-12 MED ORDER — PREDNISONE 5 MG PO TABS
30.0000 mg | ORAL_TABLET | Freq: Every day | ORAL | Status: DC
  Administered 2023-05-13: 30 mg via ORAL
  Filled 2023-05-12: qty 2

## 2023-05-12 MED ORDER — STERILE WATER FOR INJECTION IV SOLN
INTRAVENOUS | Status: AC
  Filled 2023-05-12: qty 1000

## 2023-05-12 NOTE — Progress Notes (Signed)
 PROGRESS NOTE    [name]  OZH:[id number] DOB: May 31, 1960 DOA: 05/08/2023 PCP: [name], MD    Chief Complaint  Patient presents with   Chest Pain    Brief Narrative:   [name] is a 63 y.o. 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 - Upon arrival to the ED, he was found with hemoglobin of 4.1, positive FOBT, received 3 units PRBC transfusion so far, GI were consulted, she went for endoscopy yesterday, which was significant for gastritis, multiple angiectasia's status post ERCP.       Assessment & Plan:   Principal Problem:   Symptomatic anemia Active Problems:   Essential hypertension   Generalized anxiety disorder   Polysubstance abuse (HCC)   COPD (chronic obstructive pulmonary disease) (HCC)   Chronic pain syndrome   Chronic kidney disease (CKD), stage V (HCC)    Acute blood loss anemia Upper GI bleed Gastritis, multiple angiectasia's and stomach, duodenum, jejunum status post APC -Presents with acute blood loss anemia, melena with Hemoccult positive stool -Hemoglobin 4.1 on presentation, received 3 units PRBC transfusion, hemoglobin 7.4 this morning, still dyspneic, symptomatic, with known renal disease, will transfuse another unit PRBC. -GI input greatly appreciated, status post endoscopy 2/22, significant for nonobstructive, gastritis, with multiple angiectasia's in stomach, duodenum and jejunum, status post APC treatment -Continue with PPI twice daily time 10 weeks then daily -Avoid NSAIDs   CKD V; metabolic acidosis; hypervolemia  - Restrict fluids, renally-dose medications, continue torsemide and bicarbonate  -As needed IV Lasix posttransfusion -Bicarb level remains significantly low, started on bicarb drip, will  continue for another 24 hours, continue with increased dose orally bicarb  -Creatinine remains significantly elevated, patient used to be on HD in the past, but made a decision to stop hemodialysis, does not wish for any further HD.   COPD exacerbation -This morning she is with diminished air entry, wheezing, cough and congestion, she reports significant dyspnea, so started on steroids, continue with scheduled DuoNebs) albuterol -Will taper steroids, continue with Rocephin and Mucinex  Depression, anxiety  - Continue Buspar, trazodone, and as-needed hydroxyzine      Chronic pain  - Continue gabapentin   Tobacco abuse -Counseled, started on nicotine patch   Substance abuse  Cocaine abuse -Tested positive for cocaine, she was counseled.  Failure to thrive Routine malnutrition -Started on supplement  DVT prophylaxis: SCD's Code Status: DNR Family Communication: none at bedside Disposition:   Status is: Inpatient    Consultants:  Gastroenterology   Subjective:  Reports cough, congestion, reports currently a cough more productive after doing incentive spirometer and flutter valve, does report headache.  Objective: Vitals:   05/12/23 0400 05/12/23 0500 05/12/23 0700 05/12/23 0733  BP: (!) 159/76     Pulse:  74 76 84  Resp:  (!) 9 (!) 38 14  Temp: 98.6 F (37 C)   (!) 97.5 F (36.4 C)  TempSrc: Oral   Oral  SpO2:      Weight:  56.6 kg    Height:        Intake/Output Summary (Last 24 hours) at 05/12/2023 1211 Last data filed at 05/12/2023 0600 Gross per 24 hour  Intake 720 ml  Output --  Net 720 ml   Filed Weights   05/10/23 0512 05/11/23 0449 05/12/23 0500  Weight: 53.4  kg 54.6 kg 56.6 kg    Examination:  Awake Alert, Oriented X 3, extremely frail, deconditioned, appears older than stated age Symmetrical Chest wall movement, scattered Rales bilaterally RRR,No Gallops,Rubs or new Murmurs, No Parasternal Heave +ve B.Sounds, Abd Soft, No tenderness, No  rebound - guarding or rigidity. No Cyanosis, Clubbing or edema, No new Rash or bruise       Data Reviewed: I have personally reviewed following labs and imaging studies  CBC: Recent Labs  Lab 05/08/23 2240 05/09/23 0507 05/09/23 1559 05/09/23 2104 05/10/23 0221 05/11/23 0539 05/12/23 0853  WBC 4.1  --   --   --  4.0 4.8 5.6  HGB 4.1*   < > 6.7* 7.4* 7.4* 8.7* 9.2*  HCT 12.6*   < > 20.0* 22.0* 22.2* 25.6* 26.6*  MCV 104.1*  --   --   --  90.6 88.6 89.3  PLT 210  --   --   --  197 206 234   < > = values in this interval not displayed.    Basic Metabolic Panel: Recent Labs  Lab 05/08/23 2240 05/10/23 0221 05/11/23 0539 05/12/23 0853  NA 139 141 136 136  K 3.8 4.1 4.2 4.1  CL 111 109 107 106  CO2 13* 14* 13* 15*  GLUCOSE 111* 88 102* 104*  BUN 84* 79* 75* 86*  CREATININE 5.31* 5.03* 5.00* 5.37*  CALCIUM 8.1* 8.9 8.6* 7.8*    GFR: Estimated Creatinine Clearance: 9.7 mL/min (A) (by C-G formula based on SCr of 5.37 mg/dL (H)).  Liver Function Tests: Recent Labs  Lab 05/08/23 2240 05/10/23 0221  AST 73* 86*  ALT 67* 65*  ALKPHOS 83 86  BILITOT 0.4 0.6  PROT 5.6* 5.3*  ALBUMIN 2.7* 2.5*    CBG: Recent Labs  Lab 05/09/23 1510  GLUCAP 147*     No results found for this or any previous visit (from the past 240 hours).       Radiology Studies: No results found.       Scheduled Meds:  (feeding supplement) PROSource Plus  30 mL Oral BID BM   amLODipine  10 mg Oral Daily   atorvastatin  10 mg Oral Daily   busPIRone  10 mg Oral BID   ferrous sulfate  325 mg Oral Q breakfast   fluticasone furoate-vilanterol  1 puff Inhalation Daily   And   umeclidinium bromide  1 puff Inhalation Daily   gabapentin  100 mg Oral TID   guaiFENesin  600 mg Oral BID   methylPREDNISolone (SOLU-MEDROL) injection  40 mg Intravenous Q24H   nicotine  21 mg Transdermal Daily   pantoprazole  40 mg Oral BID   polyethylene glycol  17 g Oral Daily   sodium bicarbonate   1,300 mg Oral TID   sodium chloride flush  3 mL Intravenous Q12H   torsemide  100 mg Oral Daily   Continuous Infusions:  cefTRIAXone (ROCEPHIN)  IV 2 g (05/12/23 0907)   sodium bicarbonate 150 mEq in sterile water 1,150 mL infusion       LOS: 3 days    Huey Bienenstock, MD [facility]   To contact the attending provider between 7A-7P or the covering provider during after hours 7P-7A, please log into the web site [email or web address] and access using universal [place] password for that web site. If you do not have the password, please call the hospital operator.  05/12/2023, 12:11 PM

## 2023-05-12 NOTE — Evaluation (Signed)
 Physical Therapy Brief Evaluation and Discharge Note Patient Details Name: ALMENDRA LORIA MRN: [id number] DOB: 1960/04/26 Today's Date: 05/12/2023   History of Present Illness  Delvina M Seckel is a 63 y.o. female presents with generalized pain, nausea, and fatigue. Past medical history significant for polysubstance abuse, depression, anxiety, COPD, hypertension, untreated hepatitis C, esophagitis, PUD, AVMs and CKD 5 no longer on hemodialysis per patient's wishes.  Clinical Impression  Pt presents with admitting diagnosis above. Pt today was able to ambulate in hallway with no AD independently and navigate stairs with supervision. PTA pt reports she was mostly independent however would occasionally furniture surf when fatigued. Pt presents at or near baseline mobility. Pt has no further acute PT needs and will be signing off. Re consult PT if mobility status changes. Pt would benefit from continued mobility with mobility specialist during acute stay. Pt may benefit from a SPC upon DC.        PT Assessment Patient does not need any further PT services  Assistance Needed at Discharge  PRN    Equipment Recommendations Cane  Recommendations for Other Services       Precautions/Restrictions Precautions Precautions: Fall Recall of Precautions/Restrictions: Intact Precaution/Restrictions Comments: Contact precs Restrictions Weight Bearing Restrictions Per Provider Order: No        Mobility  Bed Mobility   Supine/Sidelying to sit: Modified independent (Device/Increased time) Sit to supine/sidelying: Modified independent (Device/Increased time)    Transfers Overall transfer level: Independent Equipment used: None Transfers: Sit to/from Stand Sit to Stand: Independent                Ambulation/Gait Ambulation/Gait assistance: Modified independent (Device/Increase time) Gait Distance (Feet): 150 Feet Assistive device: IV Pole, None Gait Pattern/deviations: Step-through  pattern, Narrow base of support, Decreased dorsiflexion - left, Decreased dorsiflexion - right Gait Speed: Pace WFL General Gait Details: no LOB or buckling, pt occasionally would use IV pole to steady.  Home Activity Instructions    Stairs Stairs: Yes Stairs assistance: Supervision Stair Management: One rail Right, Alternating pattern, Forwards Number of Stairs: 6 General stair comments: No LOB noted. Pt educated on proper sequencing.  Modified Rankin (Stroke Patients Only)        Balance Overall balance assessment: Independent                        Pertinent Vitals/Pain PT - Brief Vital Signs All Vital Signs Stable: Yes Pain Assessment Pain Assessment: 0-10 Pain Score: 5  Pain Location: Migraine, back, stomach Pain Descriptors / Indicators: Aching, Discomfort Pain Intervention(s): Monitored during session, Premedicated before session     Home Living Family/patient expects to be discharged to:: Private residence Living Arrangements: Spouse/significant other Available Help at Discharge: Friend(s);Available 24 hours/day;Family Home Environment: Level entry;Stairs in home  Stairs-Number of Steps: 12 Home Equipment: None   Additional Comments: Pt lives with friend and friend's brother, available 24/7    Prior Function Level of Independence: Independent      UE/LE Assessment   UE ROM/Strength/Tone/Coordination: WFL    LE ROM/Strength/Tone/Coordination: [facility]      Communication   Communication Communication: No apparent difficulties     Cognition Overall Cognitive Status: Appears within functional limits for tasks assessed/performed       General Comments General comments (skin integrity, edema, etc.): VSS on RA    Exercises     Assessment/Plan    PT Problem List Decreased strength;Decreased activity tolerance;Decreased balance;Decreased mobility;Decreased knowledge of use of DME;Decreased safety awareness  PT Visit Diagnosis  Unsteadiness on feet (R26.81);Muscle weakness (generalized) (M62.81)    No Skilled PT Patient at baseline level of functioning;Patient is independent with all acitivity/mobility   Co-evaluation                AMPAC 6 Clicks Help needed turning from your back to your side while in a flat bed without using bedrails?: None Help needed moving from lying on your back to sitting on the side of a flat bed without using bedrails?: None Help needed moving to and from a bed to a chair (including a wheelchair)?: None Help needed standing up from a chair using your arms (e.g., wheelchair or bedside chair)?: None Help needed to walk in hospital room?: None Help needed climbing 3-5 steps with a railing? : A Little 6 Click Score: 23      End of Session Equipment Utilized During Treatment: Gait belt Activity Tolerance: Patient tolerated treatment well Patient left: in bed;with call bell/phone within reach Nurse Communication: Mobility status PT Visit Diagnosis: Unsteadiness on feet (R26.81);Muscle weakness (generalized) (M62.81)     Time: 1001-1030 PT Time Calculation (min) (ACUTE ONLY): 29 min  Charges:   PT Evaluation $PT Eval Low Complexity: 1 Low PT Treatments $Gait Training: 8-22 mins    Shela Nevin, PT, DPT Acute Rehab Services [phone]   Gladys Damme  05/12/2023, 10:49 AM

## 2023-05-12 NOTE — Telephone Encounter (Signed)
 Hospital follow up scheduled with Dr. Myrtie Neither on Tuesday, 07/15/23 at 9:20 am. Appt information mailed to patient.

## 2023-05-12 NOTE — Plan of Care (Signed)

## 2023-05-12 NOTE — Plan of Care (Signed)
  Problem: Education: Goal: Knowledge of General Education information will improve Description: Including pain rating scale, medication(s)/side effects and non-pharmacologic comfort measures Outcome: Progressing   Problem: Clinical Measurements: Goal: Cardiovascular complication will be avoided Outcome: Progressing   Problem: Activity: Goal: Risk for activity intolerance will decrease Outcome: Progressing   Problem: Nutrition: Goal: Adequate nutrition will be maintained Outcome: Progressing   Problem: Coping: Goal: Level of anxiety will decrease Outcome: Progressing   Problem: Elimination: Goal: Will not experience complications related to bowel motility Outcome: Progressing   Problem: Pain Managment: Goal: General experience of comfort will improve and/or be controlled Outcome: Progressing

## 2023-05-12 NOTE — Telephone Encounter (Signed)
-----   Message from [name] sent at 05/10/2023  9:19 AM EST ----- [facility], could we schedule a follow up with Dr. Myrtie Neither or APP in 1-2 months to discuss getting a surveillance colonoscopy for history of colon polyps. Thanks.

## 2023-05-13 ENCOUNTER — Other Ambulatory Visit (HOSPITAL_COMMUNITY): Payer: Self-pay

## 2023-05-13 DIAGNOSIS — D649 Anemia, unspecified: Secondary | ICD-10-CM | POA: Diagnosis not present

## 2023-05-13 LAB — BASIC METABOLIC PANEL
Anion gap: 16 — ABNORMAL HIGH (ref 5–15)
BUN: 98 mg/dL — ABNORMAL HIGH (ref 8–23)
CO2: 19 mmol/L — ABNORMAL LOW (ref 22–32)
Calcium: 7.3 mg/dL — ABNORMAL LOW (ref 8.9–10.3)
Chloride: 102 mmol/L (ref 98–111)
Creatinine, Ser: 5.34 mg/dL — ABNORMAL HIGH (ref 0.44–1.00)
GFR, Estimated: 9 mL/min — ABNORMAL LOW (ref >60)
Glucose, Bld: 89 mg/dL (ref 70–99)
Potassium: 4 mmol/L (ref 3.5–5.1)
Sodium: 137 mmol/L (ref 135–145)

## 2023-05-13 MED ORDER — SODIUM BICARBONATE 650 MG PO TABS
1300.0000 mg | ORAL_TABLET | Freq: Two times a day (BID) | ORAL | 0 refills | Status: DC
  Filled 2023-05-13: qty 120, 30d supply, fill #0

## 2023-05-13 MED ORDER — TRAZODONE HCL 50 MG PO TABS
50.0000 mg | ORAL_TABLET | Freq: Every evening | ORAL | 0 refills | Status: DC | PRN
Start: 2023-05-13 — End: 2023-06-11
  Filled 2023-05-13: qty 30, 30d supply, fill #0

## 2023-05-13 MED ORDER — HYDROCODONE-ACETAMINOPHEN 5-325 MG PO TABS
1.0000 | ORAL_TABLET | Freq: Four times a day (QID) | ORAL | 0 refills | Status: DC | PRN
  Filled 2023-05-13: qty 10, 3d supply, fill #0

## 2023-05-13 MED ORDER — AMLODIPINE BESYLATE 10 MG PO TABS
10.0000 mg | ORAL_TABLET | Freq: Every day | ORAL | 0 refills | Status: DC
  Filled 2023-05-13: qty 30, 30d supply, fill #0

## 2023-05-13 MED ORDER — METHYLPREDNISOLONE 4 MG PO TBPK
ORAL_TABLET | ORAL | 0 refills | Status: DC
  Filled 2023-05-13: qty 21, 6d supply, fill #0

## 2023-05-13 MED ORDER — TORSEMIDE 100 MG PO TABS
100.0000 mg | ORAL_TABLET | Freq: Every day | ORAL | 0 refills | Status: DC
  Filled 2023-05-13: qty 30, 30d supply, fill #0

## 2023-05-13 MED ORDER — ATORVASTATIN CALCIUM 10 MG PO TABS
10.0000 mg | ORAL_TABLET | Freq: Every day | ORAL | 0 refills | Status: DC
  Filled 2023-05-13: qty 30, 30d supply, fill #0

## 2023-05-13 MED ORDER — PANTOPRAZOLE SODIUM 40 MG PO TBEC
40.0000 mg | DELAYED_RELEASE_TABLET | Freq: Two times a day (BID) | ORAL | 0 refills | Status: DC
  Filled 2023-05-13: qty 60, 30d supply, fill #0

## 2023-05-13 MED ORDER — GUAIFENESIN ER 600 MG PO TB12
600.0000 mg | ORAL_TABLET | Freq: Two times a day (BID) | ORAL | 0 refills | Status: AC
  Filled 2023-05-13: qty 20, 10d supply, fill #0

## 2023-05-13 MED ORDER — NICOTINE 21 MG/24HR TD PT24: 21.0000 mg | MEDICATED_PATCH | Freq: Every day | TRANSDERMAL | Status: DC

## 2023-05-13 NOTE — Discharge Summary (Signed)
 Physician Discharge Summary  [name] BJY:[id number] DOB: 01/16/1961 DOA: 05/08/2023  PCP: [name], MD  Admit date: 05/08/2023 Discharge date: 05/13/2023  Admitted From:(Home) Disposition:  (Home )  Recommendations for Outpatient Follow-up:  Follow up with PCP in 1-2 weeks Please obtain BMP/CBC in one week    Diet recommendation: Heart Healthy / renal  Procedures - endoscopy 2/22status post endoscopy 2/22, significant for nonobstructive, gastritis, with multiple angiectasia's in stomach, duodenum and jejunum, status post APC treatment   Brief/Interim Summary: [name] is a 63 y.o. 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 - Upon arrival to the ED, he was found with hemoglobin of 4.1, positive FOBT, received 3 units PRBC transfusion so far, GI were consulted, she went for endoscopy yesterday, which was significant for gastritis, multiple angiectasia's status post ERCP.     Acute blood loss anemia Upper GI bleed Gastritis, multiple angiectasia's and stomach, duodenum, jejunum status post APC -Presents with acute blood loss anemia, melena with Hemoccult positive stool -Hemoglobin 4.1 on presentation, total of 4 units PRBC transfusion during hospital stay, hemoglobin stable at time of discharge from 9.2  -GI input greatly appreciated, status post endoscopy 2/22, significant for nonobstructive, gastritis, with multiple angiectasia's in stomach, duodenum and jejunum, status post APC treatment -Continue with PPI twice daily time 10 weeks then daily -Avoid NSAIDs    CKD V; metabolic acidosis; hypervolemia  - Restrict fluids, renally-dose medications, continue torsemide and bicarbonate  -As needed IV Lasix posttransfusion -Bicarb level remains  significantly low, she was she was started on bicarb drip over last 48 hours, much improved, it is 19 on discharge, but overall her creatinine remains elevated at 5.34 Percheron her baseline, BUN elevated at 98 at time of discharge, patient still do not want hemodialysis, she was instructed to schedule an appointment with her primary nephrologist Dr. [name] as an outpatient. -Creatinine remains significantly elevated, patient used to be on HD in the past, but made a decision to stop hemodialysis, does not wish for any further HD.   COPD exacerbation -Treated with steroids, scheduled DuoNebs, and antibiotics, this has improved she will be discharged on prednisone taper   Depression, anxiety  - Continue Buspar, trazodone, and as-needed hydroxyzine      Chronic pain  - Continue gabapentin    Tobacco abuse -Counseled, started on nicotine patch   Substance abuse  Cocaine abuse -Tested positive for cocaine, she was counseled.   Failure to thrive Routine malnutrition -Started on supplement    Discharge Diagnoses:  Principal Problem:   Symptomatic anemia Active Problems:   Essential hypertension   Generalized anxiety disorder   Polysubstance abuse (HCC)   COPD (chronic obstructive pulmonary disease) (HCC)   Chronic pain syndrome   Chronic kidney disease (CKD), stage V (HCC)    Discharge Instructions  Discharge Instructions     Diet - low sodium heart healthy   Complete by: As directed    Discharge instructions   Complete by: As directed    Follow with Primary MD [name], MD in 7 days   Get CBC, CMP, 2 view Chest X ray checked  by Primary MD next visit.    Activity: As tolerated with Full fall precautions use walker/cane & assistance as needed   Disposition Home   Diet: Heart Healthy / renal  diet   On your next visit with your primary care physician please Get Medicines reviewed and adjusted.   Please request your Prim.MD to go over all Hospital Tests  and Procedure/Radiological results at the follow up, please get all Hospital records sent to your Prim MD by signing hospital release before you go home.   If you experience worsening of your admission symptoms, develop shortness of breath, life threatening emergency, suicidal or homicidal thoughts you must seek medical attention immediately by calling 911 or calling your MD immediately  if symptoms less severe.  You Must read complete instructions/literature along with all the possible adverse reactions/side effects for all the Medicines you take and that have been prescribed to you. Take any new Medicines after you have completely understood and accpet all the possible adverse reactions/side effects.   Do not drive, operating heavy machinery, perform activities at heights, swimming or participation in water activities or provide baby sitting services if your were admitted for syncope or siezures until you have seen by Primary MD or a Neurologist and advised to do so again.  Do not drive when taking Pain medications.    Do not take more than prescribed Pain, Sleep and Anxiety Medications  Special Instructions: If you have smoked or chewed Tobacco  in the last 2 yrs please stop smoking, stop any regular Alcohol  and or any Recreational drug use.  Wear Seat belts while driving.   Please note  You were cared for by a hospitalist during your hospital stay. If you have any questions about your discharge medications or the care you received while you were in the hospital after you are discharged, you can call the unit and asked to speak with the hospitalist on call if the hospitalist that took care of you is not available. Once you are discharged, your primary care physician will handle any further medical issues. Please note that NO REFILLS for any discharge medications will be authorized once you are discharged, as it is imperative that you return to your primary care physician (or establish a  relationship with a primary care physician if you do not have one) for your aftercare needs so that they can reassess your need for medications and monitor your lab values.   Increase activity slowly   Complete by: As directed       Allergies as of 05/13/2023       Reactions   Zestril [lisinopril] Other (See Comments)   Hyperkalemia        Medication List     TAKE these medications    albuterol 108 (90 Base) MCG/ACT inhaler Commonly known as: VENTOLIN HFA Inhale 2 puffs into the lungs every 6 (six) hours as needed for wheezing or shortness of breath. What changed: how much to take   amLODipine 10 MG tablet Commonly known as: NORVASC Take 1 tablet (10 mg total) by mouth daily.   atorvastatin 10 MG tablet Commonly known as: LIPITOR Take 1 tablet (10 mg total) by mouth daily.   busPIRone 10 MG tablet Commonly known as: BUSPAR Take 1 tablet (10 mg total) by mouth 2 (two) times daily.   ferrous sulfate 325 (65 FE) MG tablet Take 1 tablet (325 mg total) by mouth daily with breakfast.   gabapentin 100 MG capsule Commonly known as: NEURONTIN Take 1 capsule (100 mg total) by mouth 3 (three) times daily.   guaiFENesin 600 MG 12 hr tablet Commonly known as: MUCINEX Take 1 tablet (600 mg total) by mouth  2 (two) times daily for 10 days.   HYDROcodone-acetaminophen 5-325 MG tablet Commonly known as: NORCO/VICODIN Take 1 tablet by mouth every 6 (six) hours as needed for severe pain (pain score 7-10).   hydrOXYzine 25 MG tablet Commonly known as: ATARAX Take 1 tablet (25 mg total) by mouth 3 (three) times daily as needed for anxiety or itching.   ipratropium-albuterol 0.5-2.5 (3) MG/3ML Soln Commonly known as: DUONEB Take 3 mLs by nebulization every 6 (six) hours as needed (shortness of breath and wheezing).   methylPREDNISolone 4 MG Tbpk tablet Commonly known as: MEDROL DOSEPAK follow package directions   multivitamin Tabs tablet Take 1 tablet by mouth at bedtime.    nicotine 21 mg/24hr patch Commonly known as: NICODERM CQ - dosed in mg/24 hours Place 1 patch (21 mg total) onto the skin daily.   pantoprazole 40 MG tablet Commonly known as: PROTONIX Take 1 tablet (40 mg total) by mouth 2 (two) times daily.   sodium bicarbonate 650 MG tablet Take 2 tablets (1,300 mg total) by mouth 2 (two) times daily. What changed:  how much to take when to take this   torsemide 100 MG tablet Commonly known as: DEMADEX Take 1 tablet (100 mg total) by mouth daily.   traZODone 50 MG tablet Commonly known as: DESYREL Take 1 tablet (50 mg total) by mouth at bedtime as needed for sleep.   Trelegy Ellipta 100-62.5-25 MCG/ACT Aepb Generic drug: Fluticasone-Umeclidin-Vilant Inhale 1 puff into the lungs daily.        Allergies  Allergen Reactions   Zestril [Lisinopril] Other (See Comments)    Hyperkalemia    Consultations: GI   Procedures/Studies: DG Abd Portable 1V Result Date: 05/09/2023 CLINICAL DATA:  Anemic with pain EXAM: PORTABLE ABDOMEN - 1 VIEW COMPARISON:  01/28/2023 FINDINGS: Right-sided pleural effusion. Nonobstructed gas pattern with moderate stool. Vascular calcifications. IMPRESSION: Nonobstructed gas pattern with moderate stool. Electronically Signed   By: Jasmine Pang M.D.   On: 05/09/2023 17:03   DG Chest Port 1 View Result Date: 05/08/2023 CLINICAL DATA:  Chest pain. EXAM: PORTABLE CHEST 1 VIEW COMPARISON:  Radiograph 04/15/2023, CT 11/15/2021 FINDINGS: Chronic volume loss in the right hemithorax. Cardiomegaly. Unchanged mediastinal contours. Small bilateral pleural effusions. Vascular congestion without definite pulmonary edema. No pneumothorax. IMPRESSION: 1. Cardiomegaly with vascular congestion and small bilateral pleural effusions. 2. Chronic volume loss in the right hemithorax. Electronically Signed   By: Narda Rutherford M.D.   On: 05/08/2023 22:57   DG Shoulder Left Result Date: 04/20/2023 CLINICAL DATA:  Acute left shoulder pain  EXAM: LEFT SHOULDER - 2+ VIEW COMPARISON:  09/16/2012 FINDINGS: Mild degenerative AC joint spurring. Subacromial morphology is type 2 (curved). No fracture or malalignment.  No acute bony findings. Atherosclerotic calcification of the aortic arch. IMPRESSION: 1. Mild degenerative AC joint spurring. 2.  Aortic Atherosclerosis (ICD10-I70.0). Electronically Signed   By: Gaylyn Rong M.D.   On: 04/20/2023 17:40   VAS US DUPLEX DIALYSIS ACCESS (AVF, AVG) Result Date: 04/16/2023 DIALYSIS ACCESS Patient Name:  JAXON MYNHIER Corlett  Date of Exam:   04/16/2023 Medical Rec #: [id number]       Accession #:    [id number] Date of Birth: 07/25/1960       Patient Gender: F Patient Age:   82 years Exam Location:  [facility] Procedure:      VAS US DUPLEX DIALYSIS ACCESS (AVF, AVG) Referring Phys: Lemar Livings --------------------------------------------------------------------------------  Reason for Exam: Mechanical complication of AVF. Access Site: Right Upper Extremity.  Access Type: Brachial-basilic AVF. Performing Technologist: Ernestene Mention RVT, RDMS  Examination Guidelines: A complete evaluation includes B-mode imaging, spectral Doppler, color Doppler, and power Doppler as needed of all accessible portions of each vessel. Unilateral testing is considered an integral part of a complete examination. Limited examinations for reoccurring indications may be performed as noted.  Findings: +--------------------+----------+-----------------+--------+ AVF                 PSV (cm/s)Flow Vol (mL/min)Comments +--------------------+----------+-----------------+--------+ Native artery inflow   209           561                +--------------------+----------+-----------------+--------+ AVF Anastomosis        289                              +--------------------+----------+-----------------+--------+  +------------+----------+-------------+----------+--------+ OUTFLOW VEINPSV (cm/s)Diameter (cm)Depth (cm)Describe  +------------+----------+-------------+----------+--------+ Prox UA        268                                    +------------+----------+-------------+----------+--------+ Mid UA         139                                    +------------+----------+-------------+----------+--------+ Dist UA        319                                    +------------+----------+-------------+----------+--------+ AC Fossa       265                                    +------------+----------+-------------+----------+--------+   Summary: Patent AVF with volume flow <600 cm/s.  *See table(s) above for measurements and observations.  Diagnosing physician: Heath Lark Electronically signed by Heath Lark on 04/16/2023 at 4:17:01 PM.    --------------------------------------------------------------------------------   Final    DG Forearm Right Result Date: 04/16/2023 CLINICAL DATA:  Pain and swelling in the right elbow, initial encounter EXAM: RIGHT FOREARM - 2 VIEW COMPARISON:  None Available. FINDINGS: No acute fracture or dislocation is noted. No soft tissue abnormality is seen. Surgical clips are noted along the distal humerus. IMPRESSION: No acute abnormality noted Electronically Signed   By: Alcide Clever M.D.   On: 04/16/2023 03:12   MR Lumbar Spine W Wo Contrast Result Date: 04/16/2023 CLINICAL DATA:  concern for lumbar infection EXAM: MRI LUMBAR SPINE WITHOUT AND WITH CONTRAST TECHNIQUE: Multiplanar and multiecho pulse sequences of the lumbar spine were obtained without and with intravenous contrast. CONTRAST:  5mL GADAVIST GADOBUTROL 1 MMOL/ML IV SOLN COMPARISON:  None Available. FINDINGS: Segmentation:  Standard. Alignment:  Normal. Vertebrae: Mild degenerative/discogenic endplate signal changes at L4-L5 and L5-S1. Otherwise, no marrow edema to suggest discitis/osteomyelitis or acute fracture. No suspicious bone lesions. No abnormal enhancement. Conus medullaris and cauda equina: Conus extends  to the T12-L1 level. Conus and cauda equina appear normal. No visible canal fluid collection. Paraspinal and other soft tissues: Small left kidney. Right kidney is not visible and may be absent or atrophic. Disc levels: L1-L2: No significant disc protrusion, foraminal stenosis,  or canal stenosis. L2-L3: Disc bulging and left foraminal disc protrusion. Mild canal stenosis. No significant foraminal stenosis. L3-L4: Small left foraminal disc protrusion. Resulting mild left foraminal stenosis. Patent canal and right foramen. L4-L5: Disc height loss and desiccation. Disc bulging. Mild canal stenosis. Resulting moderate right foraminal stenosis. Patent left foramen. L5-S1: Disc height loss and desiccation. Disc bulging. Central disc protrusion. Bilateral facet arthropathy. Mild bilateral foraminal stenosis. Bilateral facet arthropathy. IMPRESSION: 1. No specific evidence of discitis/osteomyelitis or other acute abnormality. 2. Mild canal stenosis at L2-L3 and L4-L5. 3. Moderate right foraminal stenosis at L4-L5. 4. Mild foraminal stenosis on the left at L3-L4 and bilaterally at L5-S1. Electronically Signed   By: Feliberto Harts M.D.   On: 04/16/2023 00:09   DG Chest 2 View Result Date: 04/15/2023 CLINICAL DATA:  Sepsis.  Bilateral leg pain. EXAM: CHEST - 2 VIEW COMPARISON:  02/10/2023 FINDINGS: Heart size and mediastinal contours appear normal. Small right pleural effusion is improved when compared with the previous exam. No signs of interstitial edema or airspace consolidation. Bones appear osteopenic and there is mild multilevel degenerative disc disease. IMPRESSION: Small right pleural effusion, improved when compared with the previous exam. No signs of pneumonia. Electronically Signed   By: Signa Kell M.D.   On: 04/15/2023 19:43     Subjective: Denies any complaints today, no significant events overnight,  Discharge Exam: Vitals:   05/13/23 0400 05/13/23 0744  BP: (!) 156/87 (!) 151/82  Pulse:     Resp: 18   Temp: 98 F (36.7 C) 98.1 F (36.7 C)  SpO2: 100% 96%   Vitals:   05/12/23 1955 05/13/23 0400 05/13/23 0500 05/13/23 0744  BP:  (!) 156/87  (!) 151/82  Pulse:      Resp:  18    Temp: (!) 97.5 F (36.4 C) 98 F (36.7 C)  98.1 F (36.7 C)  TempSrc: Oral Oral  Oral  SpO2:  100%  96%  Weight:   56.6 kg   Height:        General: Pt is alert, awake, frail,  patient appears older than stated age Cardiovascular: RRR, S1/S2 +, no rubs, no gallops Respiratory: Entry bilaterally, minimal rhonchi Abdominal: Soft, NT, ND, bowel sounds + Extremities: no edema, no cyanosis    The results of significant diagnostics from this hospitalization (including imaging, microbiology, ancillary and laboratory) are listed below for reference.     Microbiology: No results found for this or any previous visit (from the past 240 hours).   Labs: BNP (last 3 results) Recent Labs    08/12/22 1710 11/09/22 2126 01/23/23 0200  BNP 1,084.9* 383.3* 2,324.5*   Basic Metabolic Panel: Recent Labs  Lab 05/08/23 2240 05/10/23 0221 05/11/23 0539 05/12/23 0853 05/13/23 0439  NA 139 141 136 136 137  K 3.8 4.1 4.2 4.1 4.0  CL 111 109 107 106 102  CO2 13* 14* 13* 15* 19*  GLUCOSE 111* 88 102* 104* 89  BUN 84* 79* 75* 86* 98*  CREATININE 5.31* 5.03* 5.00* 5.37* 5.34*  CALCIUM 8.1* 8.9 8.6* 7.8* 7.3*   Liver Function Tests: Recent Labs  Lab 05/08/23 2240 05/10/23 0221  AST 73* 86*  ALT 67* 65*  ALKPHOS 83 86  BILITOT 0.4 0.6  PROT 5.6* 5.3*  ALBUMIN 2.7* 2.5*   Recent Labs  Lab 05/08/23 2240  LIPASE 40   No results for input(s): "AMMONIA" in the last 168 hours. CBC: Recent Labs  Lab 05/08/23 2240 05/09/23 0507 05/09/23 1559 05/09/23 2104  05/10/23 0221 05/11/23 0539 05/12/23 0853  WBC 4.1  --   --   --  4.0 4.8 5.6  HGB 4.1*   < > 6.7* 7.4* 7.4* 8.7* 9.2*  HCT 12.6*   < > 20.0* 22.0* 22.2* 25.6* 26.6*  MCV 104.1*  --   --   --  90.6 88.6 89.3  PLT 210  --   --    --  197 206 234   < > = values in this interval not displayed.   Cardiac Enzymes: No results for input(s): "CKTOTAL", "CKMB", "CKMBINDEX", "TROPONINI" in the last 168 hours. BNP: Invalid input(s): "POCBNP" CBG: Recent Labs  Lab 05/09/23 1510  GLUCAP 147*   D-Dimer No results for input(s): "DDIMER" in the last 72 hours. Hgb A1c No results for input(s): "HGBA1C" in the last 72 hours. Lipid Profile No results for input(s): "CHOL", "HDL", "LDLCALC", "TRIG", "CHOLHDL", "LDLDIRECT" in the last 72 hours. Thyroid function studies No results for input(s): "TSH", "T4TOTAL", "T3FREE", "THYROIDAB" in the last 72 hours.  Invalid input(s): "FREET3" Anemia work up No results for input(s): "VITAMINB12", "FOLATE", "FERRITIN", "TIBC", "IRON", "RETICCTPCT" in the last 72 hours. Urinalysis    Component Value Date/Time   COLORURINE YELLOW 04/16/2023 0200   APPEARANCEUR HAZY (A) 04/16/2023 0200   LABSPEC 1.009 04/16/2023 0200   PHURINE 6.0 04/16/2023 0200   GLUCOSEU 50 (A) 04/16/2023 0200   HGBUR SMALL (A) 04/16/2023 0200   BILIRUBINUR NEGATIVE 04/16/2023 0200   KETONESUR NEGATIVE 04/16/2023 0200   PROTEINUR >=300 (A) 04/16/2023 0200   UROBILINOGEN 0.2 03/02/2010 2027   NITRITE NEGATIVE 04/16/2023 0200   LEUKOCYTESUR MODERATE (A) 04/16/2023 0200   Sepsis Labs Recent Labs  Lab 05/08/23 2240 05/10/23 0221 05/11/23 0539 05/12/23 0853  WBC 4.1 4.0 4.8 5.6   Microbiology No results found for this or any previous visit (from the past 240 hours).   Time coordinating discharge: Over 30 minutes  SIGNED:   Huey Bienenstock, MD  [facility] 05/13/2023, 4:26 PM Pager   If 7PM-7AM, please contact night-coverage [email or web address] Password [email or web address]

## 2023-05-13 NOTE — Plan of Care (Addendum)
  Problem: Clinical Measurements: Goal: Respiratory complications will improve Outcome: Progressing   Problem: Coping: Goal: Level of anxiety will decrease Outcome: Progressing   Problem: Elimination: Goal: Will not experience complications related to bowel motility Outcome: Progressing   Problem: Pain Managment: Goal: General experience of comfort will improve and/or be controlled Outcome: Progressing   Problem: Safety: Goal: Ability to remain free from injury will improve Outcome: Progressing

## 2023-05-13 NOTE — TOC Transition Note (Signed)
 Transition of Care [facility]) - Discharge Note   Patient Details  Name: [name] MRN: [id number] Date of Birth: [birth year]  Transition of [facility]) CM/SW Contact:  Gordy Clement, RN Phone Number: 05/13/2023, 9:35 AM   Clinical Narrative:    Patient will DC today to home. A [name] will be delivered to the DC lounge for her. She will need a cab voucher- to be provided by DC lounge. No additional TOC needs           Patient Goals and CMS Choice            Discharge Placement                       Discharge Plan and Services Additional resources added to the After Visit Summary for                                       Social Drivers of Health (SDOH) Interventions SDOH Screenings   Food Insecurity: Food Insecurity Present (05/09/2023)  Housing: High Risk (05/09/2023)  Transportation Needs: Unmet Transportation Needs (05/09/2023)  Utilities: At Risk (05/09/2023)  Alcohol Screen: Low Risk  (02/10/2023)  Depression (PHQ2-9): Medium Risk (02/10/2023)  Financial Resource Strain: High Risk (02/10/2023)  Physical Activity: Inactive (02/10/2023)  Social Connections: Socially Isolated (05/09/2023)  Tobacco Use: High Risk (05/09/2023)  Health Literacy: Adequate Health Literacy (02/10/2023)     Readmission Risk Interventions    01/28/2023   10:32 AM 01/27/2023    4:13 PM 11/13/2022    3:21 PM  Readmission Risk Prevention Plan  Transportation Screening Complete Complete   Medication Review Oceanographer) Complete Complete   PCP or Specialist appointment within 3-5 days of discharge  Complete   HRI or Home Care Consult  Complete --  SW Recovery Care/Counseling Consult  Complete   Palliative Care Screening  Not Applicable   Skilled Nursing Facility  Not Applicable

## 2023-05-13 NOTE — Discharge Instructions (Addendum)
 Follow with Primary MD [name], MD in 7 days   Get CBC, CMP, 2 view Chest X ray checked  by Primary MD next visit.    Activity: As tolerated with Full fall precautions use walker/cane & assistance as needed   Disposition Home   Diet: Heart Healthy / renal diet   On your next visit with your primary care physician please Get Medicines reviewed and adjusted.   Please request your Prim.MD to go over all Hospital Tests and Procedure/Radiological results at the follow up, please get all Hospital records sent to your Prim MD by signing hospital release before you go home.   If you experience worsening of your admission symptoms, develop shortness of breath, life threatening emergency, suicidal or homicidal thoughts you must seek medical attention immediately by calling 911 or calling your MD immediately  if symptoms less severe.  You Must read complete instructions/literature along with all the possible adverse reactions/side effects for all the Medicines you take and that have been prescribed to you. Take any new Medicines after you have completely understood and accpet all the possible adverse reactions/side effects.   Do not drive, operating heavy machinery, perform activities at heights, swimming or participation in water activities or provide baby sitting services if your were admitted for syncope or siezures until you have seen by Primary MD or a Neurologist and advised to do so again.  Do not drive when taking Pain medications.    Do not take more than prescribed Pain, Sleep and Anxiety Medications  Special Instructions: If you have smoked or chewed Tobacco  in the last 2 yrs please stop smoking, stop any regular Alcohol  and or any Recreational drug use.  Wear Seat belts while driving.   Please note  You were cared for by a hospitalist during your hospital stay. If you have any questions about your discharge medications or the care you received while you were in the  hospital after you are discharged, you can call the unit and asked to speak with the hospitalist on call if the hospitalist that took care of you is not available. Once you are discharged, your primary care physician will handle any further medical issues. Please note that NO REFILLS for any discharge medications will be authorized once you are discharged, as it is imperative that you return to your primary care physician (or establish a relationship with a primary care physician if you do not have one) for your aftercare needs so that they can reassess your need for medications and monitor your lab values.  [employer] assistance programs Crisis assistance programs  -Part[facility] Ending Homelessness Arts development officer. If you are experiencing homelessness in [place], [place], your first point of contact should be Pensions consultant. You can reach Coordinated Entry by calling (336) [phone] or by emailing [email or web address] [email or web address].  Community access points: [employer] ([phone] [address], [place]) every Tuesday from 9am-10am. [facility] ([address] [place]) every Wednesday from 8am-9am.   -Assumption Coordinated Re-entry Marcy Panning: Dial 211 and request. Offers referrals to homeless shelters in the area.    -The [employer] (615) 137-1300) offers several services to local families, as funding allows. The Emergency Assistance Program (EAP), which they administer, provides household goods, free food, clothing, and financial aid to people in need in the [facility] area. The EAP program does have some qualification, and counselors will interview clients for financial assistance by written referral only. Referrals need to  be made by the Department of Social Services or by other EAP approved human services agencies or charities in the area.  -Open Door Ministries of [employer], which can be reached at 828-586-9169, offers  emergency assistance programs for those in need of help, such as food, rent assistance, a soup kitchen, shelter, and clothing. They are based in [facility] but provide a number of services to those that qualify for assistance.   [facility] Department of Social Services may be able to offer temporary financial assistance and cash grants for paying rent and utilities, Help may be provided for local county residents who may be experiencing personal crisis when other resources, including government programs, are not available. Call [phone]  -High [employer] is a [employer] agency, The organization can offer emergency assistance for paying rent, Caremark Rx, utilities, food, household products and furniture. They offer extensive emergency and transitional housing for families, children and single women, and also run a Boy's and D[employer]. Thrift Shops, Secondary school teacher, and other aid offered too. [address], [place], [place] [address], [phone]  -[place] Low Income Energy Assistance Program -- This is offered for [facility] families. The federal government created [employer] Program provides a one-time cash grant payment to help eligible low-income families pay their electric and heating bills. [address], [place], [place] [address], 715-101-9934  -[place] Emergency Assistance -- A program offers emergency utility and rent funds for greater [employer] area residents. The program can also provide counseling and referrals to charities and government programs. Also provides food and a free meal program that serves lunch Mondays - Saturdays and dinner seven days per week to individuals in the community. [address], [place], [place] [address], ([phone]  -[employer] - Offers affordable apartment and housing communities across      [name] and [place]. The low income and seniors can access public housing, rental assistance to qualified applicants, and apply for the section 8 rent subsidy program. Other programs include Chiropractor and Engineer, maintenance. [address] [place], [place] [address], dial [phone].  -The [facility] provides transitional housing to veterans and the disabled. Clients will also access other services too, including assistance in applying for Disability, life skills classes, case management, and assistance in finding permanent housing. [address], [place], [place] [address], call [phone]  -[facility] Transitional Housing through [employer] is for people who were just evicted or that are formerly homeless. The non-profit will also help then gain self-sufficiency, find a home or apartment to live in, and also provides information on rent assistance when needed. Phone [phone]  -The [place] [facility] [facility] helps low income, elderly, or disabled residents in seven counties in the [place] ([place], [place], [place], [place], [place], [place], [place], and [place]) save energy and reduce their utility bills by improving energy efficiency. Phone ([phone].  -[employer] is located in the [place] [facility] in the [employer], [address], [place], [place] [address]. Parking is in the rear of the building. Phone: [phone]   General Email: info@gsohc .org  GHC provides free housing counseling assistance in locating affordable rental housing or housing with support services for families and individuals in crisis and the chronically homeless. We provide potential resources for other housing needs like utilities. Our trained counselors also  work with clients on Systems developer and financial literacy in effort to empower them to take  control of their financial situations. [employer] collaborates with homeless service providers and other stakeholders as part of the [employer] COC (Continuum of Care). The (COC) is a regional/local planning body that coordinates housing and services funding for homeless families and individuals. The role of [facility] in the COC is through housing counseling to work with people we serve on diversion strategies for those that are at imminent risk of becoming homeless. We also work with the Coordinated Assessment/Entry Specialist who attempts to find temporary solutions and/or connects the people to Housing First, Rapid Re-housing or transitional housing programs. Our Homelessness Prevention Housing Counselors meet with clients on business days (Monday-Fridays, except scheduled holidays) from 8:30 am to 4:30 pm.  Legal assistance for evictions, foreclosure, and more -If you need free legal advice on civil issues, such as foreclosures, evictions, Electronics engineer, government programs, domestic issues and more, Armed forces operational officer Aid of [facility]) is a Associate Professor firm that provides free legal services and counsel to lower income people, seniors, disabled, and others, The goal is to ensure everyone has access to justice and fair representation. Call them at [phone].  [facility] for Housing and Community Studies can provide info about obtaining legal assistance with evictions. Phone [phone].  Data processing manager  The [employer], [employer]. offers job and Dispensing optician. Resources are focused on helping students obtain the skills and experiences that are necessary to compete in today's challenging and tight job market. The non-profit faith-based community action agency offers internship trainings as well as classroom instruction. Classes are tailored to meet the needs of people in the [facility] region. [place], [place] [address], [phone]  Foreclosure  prevention/Debt Services Family Services of the [employer] Credit Counseling Service inludes debt and foreclosure prevention programs for local families. This includes money management, financial advice, budget review and development of a written action plan with a Pensions consultant to help solve specific individual financial problems. In addition, housing and mortgage counselors can also provide pre- and post-purchase homeownership counseling, default resolution counseling (to prevent foreclosure) and reverse mortgage counseling. A Debt Management Program allows people and families with a high level of credit card or medical debt to consolidate and repay consumer debt and loans to creditors and rebuild positive credit ratings and scores. Contact (336) Q4373065.  Community clinics in [place] -Health Department [facility]: [address] [place], [place], [address]. [phone].  -Health Department [place] [facility]: [address], [facility], [address]. [phone].  -[facility] offers medical care through a group of doctors, pharmacies and other healthcare related agencies that offer services for low income, uninsured adults in [place]. Also offers adult Dental care and assistance with applying for an [employer]. Call [phone].   [facility]. This center provides low-cost health care to those without health insurance. Services offered include an onsite pharmacy. Phone ([phone]. [address], [address], [place].  -Medication Assistance Program serves as a link between pharmaceutical companies and patients to provide low cost or free prescription medications. This service is available for residents who meet certain income restrictions and have no insurance coverage. PLEASE CALL [phone] Ginette Otto) OR [phone] ([place])  -One Step Further:  Materials engineer, The [employer] Support & Nutrition Program, [employer]. Call [phone]/ [phone].  Food pantry and assistance -Urban Ministry-Food Bank: [address], [place] [address]. Phone [phone]  -Blessed Table Food Pantry: [address] [place], [place] [address]. ([phone].  -Missionary Ministry: has the purpose of visiting the sick and shut-ins and provide for needs in the surrounding communities. Call (949) [phone]. Email: [email or web address] This program provides: Food box for seniors, Financial assistance, Food to meet basic nutritional needs.  -Meals on Wheels with Senior Resources: [facility] residents age 47 and over who are homebound and unable to obtain and prepare a nutritious meal for themselves are eligible for this service. There may be a waiting list in certain parts of [facility] if the route in that area is full. If you are in [facility] and [place] call [phone] to register. For all other areas call [phone] to register.  -Greater Dietitian: https://findfood.BargainContractor.si  TRANSPORTATION: -[employer] Department of Health: Call [facility] and [employer] at ([phone] for details. AttractionGuides.es  -Access [place]: Access [place] is the [employer] Agency's shared-ride transportation service for eligible riders who have a disability that prevents them from riding the fixed route bus. Call [phone]. Access [place] riders must pay a fare of $1.50 per trip, or may purchase a 10-ride punch card for $14.00 ($1.40 per ride) or a 40-ride punch card for $48.00 ($1.20 per ride).  -The Shepherd's WHEELS rideshare transportation service is provided for senior citizens (60+) who live independently within [place] city limits and are unable to drive or have limited access to  transportation. Call [phone] to schedule an appointment.  -[place] Transportation: For Medicare or Medicaid recipients call [phone]?[facility], wheelchair Zenaida Niece, and ambulatory quotes available.   FLEEING VIOLENCE: -Family Services of the [place]- 24/7 Crisis line 6178165426) -[facility] Justice Centers: (336) 641-SAFE [phone])  [place] 2-1-1 is another useful way to locate resources in the community. Visit ShedSizes.ch to find service information online. If you need additional assistance, 2-1-1 Referral Specialists are available 24 hours a day, every day by dialing 2-1-1 or [phone] from any phone. The call is free, confidential, and available in any language.  Affordable Housing Search http://www.nchousingsearch.org  [facility])   M-F 8a-3p [address] [place], [place] [address] [phone] Services include: laundry, barbering, support groups, case management, phone & computer access, showers, AA/NA mtgs, mental health/substance abuse nurse, job skills class, disability information, VA assistance, spiritual classes, etc. Winter Shelter available when temperatures are less than 32 degrees.   HOMELESS SHELTERS [facility] Night [facility] at [facility]- Call [phone] ext. 347 or ext. [phone]. Located at [address], [place], [place] [address]  [facility] Mens Shelter- Call [phone]. Located at [address] [place] [address].  [facility]- [employer]. Call [phone]. Office located at [address] [place] [address].  [facility] Family Housing through [place] [phone].  [facility] Family Shelter- Call [phone]. Located at [address] [place], [place], [place] [address].  Room at the Inn-For Pregnant mothers. Call [phone]. Located at [address] [place], [address].  [facility]-For men in [place]. Call (336)  [phone]. [facility]-Shelter in [place]. Call [phone].  Home of [employer] for [employer] ([phone]. Office located at [address], [place], [address].  [employer] be agreeable to help with chores. Call [phone] ext. [phone].  Men's: [address] [place], [place] [address]. Women's: [facility]  [address] [place], [place] [address]  Crisis Services [facility] Mobile Crisis Management- ([phone]  [facility] [address], [place], [place] [address]. Phone: [phone]   In a time of Crisis: [facility] Altern[facility], inc.  Mobile Crisis Management provides immediate crisis response, 24/7.  Call [phone]  [facility] for MH/DD/[facility] is available 24 hours a day, 7 days a week. Customer Service Specialists will assist you to find a crisis provider that is well-matched with your needs. Your local number is: [phone]  [facility]al [facility] Urgent Care (BHUC) IOP, individual counseling, medication management [address] [place], [place] [address] [phone] Call for intake hours; Medicaid and Uninsured    Substance Use Outpatient Providers  Alcohol and Drug Services (ADS) Group and individual counseling. [address]  [place], [place] [address] [phone] [place]: [phone]  [place]: [phone] Medicaid and uninsured.   [facility] Offers IOP groups multiple times per week. [address] [place], [place] [address] [phone] Takes Medicaid and other insurances.   [facility] Out[facility]  Chemical Dependency Intensive Outpatient Program (IOP) [address] [place], [place] [address] [phone] Takes Nurse, learning disability and [place].   [facility]  IOP and Partial Hospitalization Program  [address]  [place], [place] [address] [phone] Private Insurance, [place] only for partial hospitalization  [facility]ing of [place] [address], [address], [place], [place] [address] [phone] Monday-Friday. Short and [place] term options.  [facility]/Behavioral Health Urgent Care (BHUC) IOP, individual counseling, medication management [address] [place], [place] [address] [phone] Medicaid and [facility]  [facility] [address]  [place], [place] [address] [phone] Private Insurance and Self Pay   [facility] Outpatient [address]  [place], [place] [address] [phone] Private Insurance, [place], and Self Pay   [facility]: Methadone Clinic  [address] [place], [place] [address] [facility]  [address]  [place], [place] [address] [phone]  [facility]aring Services  [address] [place], [place] [address] [phone]      Residential Treatment Programs  [facility] ([facility] [address] [place], [place] [address] [phone] or [phone] Detox and Residential Rehab 21 days (Medicaid, private insurance, and self pay. If Medicare, will look into funding). No methadone. Call for pre-screen.   [facility] Treatment Services) [address]  [place], [place] [address] [phone] Detox 3-7 days (self Pay and Medicaid Limited availability). Transitional Program for females needs 60 days clean first.  Rehab Only for Males (Medicare, Medicaid, and Self Pay)-No methadone.  [facility] [address] [place], [place] [address] [phone] or [phone] Private Insurance only  [facility] PHONE: [phone] FAX: [phone] Residential program for women 21 and over for up to a year through a Christian 12-step recovery model. Self-pay.    [facility] [address] [place], [place] [address] Phone:  [phone] Must be detoxed  72 hours prior to admission; 28 day program.  Self-pay.  [facility] [address]  [place], [place] [phone] [employer], Medicare, [place] (not straight [place]). They offer assistance with transportation.   [facility] [address] [place],  [place], [place] [address] [phone] Christian Based Program. Men only. No insurance  [facility] is a substance use  disorder treatment program for women, including those who are pregnant, parenting, and/or whose lives have been touched by abuse and violence. ([phone]) ([phone]  [facility] [address] [place], [place] [address] Women's: [phone] Men's: [phone] No Medicaid.   Addiction Centers of [place] Locations across the [place] (mainly [place]) willing to help with transportation.  [phone] [employer]. [facility] Residential Treatment Facility  [address] [place].  [place], [place] [address] [phone] Treatment Only, must make assessment appointment, and must be sober for assessment appointment. Self pay, [facility], must be [facility] resident. No methadone.   [facility]  [address] [place], [place] [address] [phone] No pending legal charges, Gras-term work program. No methadone. Call for assessment.  [facility]  [address], [place], [place] [address] [phone] or [phone] Commercial Insurance Only  [facility] - [phone] Private Insurance (no [place]). Males/Females, call to make referrals, multiple facilities.   [facility] Women's Program: [facility] [address] [place], [place] [address] [phone]  [facility] Healing Transitions-no methadone: [facility] [address] [place], [place] [address] [phone] Lacy [facility] [place] [facility] Living Program [phone] [place], [place] For women, houses 8 residents for sober living. No Medicaid.         AA Meetings Website to locate meetings (virtually or in person): [email or web address].[email or web address].biz/ Phone: [phone]  Syringe Services Program: Due to COVID-19, syringe services programs are likely operating under different hours with limited or no fixed site hours. Some programs may not be operating at all. Please contact the program directly using the phone numbers provided below to see if they are still operating under COVID-19. [facility] Solution to the Opioid Problem (GCSTOP) Fixed; mobile; peer-based;Bobbye Riggs) [phone] [email or web address] Fixed site exchange at [facility], [address] [place], [place] [address] on Wednesdays (2:00 - 5:00 pm) and Thursdays (4:00 - 8:00 pm). Pop-up mobile exchange locations: [employer] and [employer], [address] [place], [place] [address] on Tuesdays (11:00 am - 1:00 pm) and Fridays (11:00 am - 1:00 pm) -[facility] - [address], [place], [place] [address] on Tuesdays (2:00 - 4:00 pm) and Fridays (2:00 - 4:00 pm) -[place] [employer] Publishing copy - also serves Radio broadcast assistant and [employer] [place] [employer];Fixed; mobile; peer-based; Lendon Ka [phone] [email or web address] [address] [place], [place] [address] Delivery and outreach available in [place] and [place], please call for more information. Monday, Tuesday: 1:00 -7:00 pm, Thursday: 4:00 pm - 8:00 pm, Friday: 1:00 pm - 8:00 pm)  Medication-Assisted Treatment (MAT):  -[place]- services [address] and surrounding areas including [place], [place], [place], [place], [address], [place], [place], [place], [place], and [place], [place]. Options include Methadone, buprenorphine or Suboxone. [address] [place], [place] [address] Phone: [phone] Mon - Fri: 5:30am - 2:00pm Sat: 5:30am -7:30am Sun:  Closed Holidays: 6:00am - 8:00am  -[facility]- We use FDA-approved medications, like methadone/suboxone/sublocade, and vivitrol. These medications are then combined with customized care plans that include individual or group counseling, toxicology, and medical care directed by on-site physicians. Accepts most insurance plans, Medicaid, and private pay.  [address] [place], [place] [address] Phone: [phone] Monday-Friday 5:00 AM - 10:00 AM Saturday 6:00 AM - 8:30 AM Sunday 6:00 AM - 7:00 AM  -[facility]- [facility] is a treatment & recovery focused program. In addition to receiving methadone medication,  our clients participate in individual and group counseling as well as random drug testing. If accepted into the [facility], you will be provided several intake appointments and a physical exam [address] [place], [place] [address] Office: [phone]  Fax: [phone]  -[facility]- We put our community members at the center of everything we do, for remote treatment services as well as in-person, from alcohol withdrawal to opioid use and more.  [address], [address], [place], [place] [address] [phone] Monday-Wednesday: 9:00am - 5:00pm Thursday: 9:00am - 6:00pm Friday: 9:00am - 5:00pm Saturday: 9:00am - 1:00pm Sunday: Closed  -[facility] [place]) [address] [place], [place] [address] [phone]  [place] [phone] [address] [place], [place] [address]  M-W    5:00am-12:00pm Thu     5:00am-10:00am Fri       5:00am-12:00pm Sat      5:00am-8:00am Sun     Closed  $12/daily for Methadone Treatment.

## 2023-05-14 ENCOUNTER — Telehealth: Payer: Self-pay

## 2023-05-14 NOTE — Transitions of Care (Post Inpatient/ED Visit) (Signed)
   05/14/2023  Name: [name] MRN: [id number] DOB: 1960/08/11  Today's TOC FU Call Status: Today's TOC FU Call Status:: Successful TOC FU Call Completed TOC FU Call Complete Date: 05/14/23 Patient's Name and Date of Birth confirmed.  Transition Care Management Follow-up Telephone Call Date of Discharge: 05/13/23 Discharge Facility: [facility]) Type of Discharge: Inpatient Admission Primary Inpatient Discharge Diagnosis:: symptomatic anemia How have you been since you were released from the hospital?: Better Any questions or concerns?: No  Items Reviewed: Did you receive and understand the discharge instructions provided?: Yes Medications obtained,verified, and reconciled?: Partial Review Completed Reason for Partial Mediation Review: She said she understands her medication regime and reviewed it with the hosptal staff prior to discharge. She said she still needs to pick up a few medications but was not sure which ones. I told her to call us if she has any questions about her meds and to bring all of them to her appointment on 05/22/2023.  She said she understood. She also confirmed that she received her nebuilzer. Any new allergies since your discharge?: No Dietary orders reviewed?: No Do you have support at home?: Yes People in Home: significant other  Medications Reviewed Today: Medications Reviewed Today   Medications were not reviewed in this encounter     Home Care and Equipment/Supplies: Were Home Health Services Ordered?: No Any new equipment or medical supplies ordered?: Yes Name of Medical supply agency?: [facility]- received it from the hospital Were you able to get the equipment/medical supplies?: Yes Do you have any questions related to the use of the equipment/supplies?: No  Functional Questionnaire: Do you need assistance with bathing/showering or dressing?: No Do you need assistance with meal preparation?: No (She has help if needed) Do you need assistance  with eating?: No Do you have difficulty maintaining continence: No Do you need assistance with getting out of bed/getting out of a chair/moving?: No Do you have difficulty managing or taking your medications?: No  Follow up appointments reviewed: PCP Follow-up appointment confirmed?: Yes Date of PCP follow-up appointment?: 05/22/23 Follow-up Provider: Corene Cornea, PA Specialist Hospital Follow-up appointment confirmed?: Yes Date of Specialist follow-up appointment?: 07/15/23 Follow-Up Specialty Provider:: GI Do you need transportation to your follow-up appointment?: No (She stated she uses Medicaid transportation but understands to call this clinic if she needs a ride to her appointment on 05/22/2023.) Do you understand care options if your condition(s) worsen?: Yes-patient verbalized understanding    SIGNATURE Robyne Peers, RN

## 2023-05-15 ENCOUNTER — Inpatient Hospital Stay: Payer: Medicaid Other | Admitting: Internal Medicine

## 2023-05-16 ENCOUNTER — Emergency Department (HOSPITAL_COMMUNITY): Payer: Medicaid Other

## 2023-05-16 ENCOUNTER — Inpatient Hospital Stay (HOSPITAL_COMMUNITY)
Admission: EM | Admit: 2023-05-16 | Discharge: 2023-05-18 | DRG: 177 | Discharge status: Against Medical Advice | Payer: Medicaid Other | Attending: Pulmonary Disease | Admitting: Pulmonary Disease

## 2023-05-16 ENCOUNTER — Other Ambulatory Visit: Payer: Self-pay

## 2023-05-16 DIAGNOSIS — F17210 Nicotine dependence, cigarettes, uncomplicated: Secondary | ICD-10-CM | POA: Diagnosis present

## 2023-05-16 DIAGNOSIS — N186 End stage renal disease: Secondary | ICD-10-CM | POA: Diagnosis present

## 2023-05-16 DIAGNOSIS — D631 Anemia in chronic kidney disease: Secondary | ICD-10-CM | POA: Diagnosis present

## 2023-05-16 DIAGNOSIS — E8770 Fluid overload, unspecified: Secondary | ICD-10-CM | POA: Diagnosis present

## 2023-05-16 DIAGNOSIS — Z79899 Other long term (current) drug therapy: Secondary | ICD-10-CM

## 2023-05-16 DIAGNOSIS — J1282 Pneumonia due to coronavirus disease 2019: Secondary | ICD-10-CM | POA: Diagnosis present

## 2023-05-16 DIAGNOSIS — Z888 Allergy status to other drugs, medicaments and biological substances status: Secondary | ICD-10-CM

## 2023-05-16 DIAGNOSIS — K219 Gastro-esophageal reflux disease without esophagitis: Secondary | ICD-10-CM | POA: Diagnosis present

## 2023-05-16 DIAGNOSIS — Z833 Family history of diabetes mellitus: Secondary | ICD-10-CM

## 2023-05-16 DIAGNOSIS — J810 Acute pulmonary edema: Secondary | ICD-10-CM | POA: Diagnosis present

## 2023-05-16 DIAGNOSIS — F1210 Cannabis abuse, uncomplicated: Secondary | ICD-10-CM | POA: Diagnosis present

## 2023-05-16 DIAGNOSIS — N179 Acute kidney failure, unspecified: Secondary | ICD-10-CM | POA: Diagnosis present

## 2023-05-16 DIAGNOSIS — Z7951 Long term (current) use of inhaled steroids: Secondary | ICD-10-CM

## 2023-05-16 DIAGNOSIS — U071 COVID-19: Principal | ICD-10-CM | POA: Diagnosis present

## 2023-05-16 DIAGNOSIS — J9621 Acute and chronic respiratory failure with hypoxia: Secondary | ICD-10-CM | POA: Diagnosis present

## 2023-05-16 DIAGNOSIS — F1010 Alcohol abuse, uncomplicated: Secondary | ICD-10-CM | POA: Diagnosis present

## 2023-05-16 DIAGNOSIS — E785 Hyperlipidemia, unspecified: Secondary | ICD-10-CM | POA: Diagnosis present

## 2023-05-16 DIAGNOSIS — N184 Chronic kidney disease, stage 4 (severe): Secondary | ICD-10-CM | POA: Diagnosis not present

## 2023-05-16 DIAGNOSIS — Z8711 Personal history of peptic ulcer disease: Secondary | ICD-10-CM

## 2023-05-16 DIAGNOSIS — J9601 Acute respiratory failure with hypoxia: Secondary | ICD-10-CM | POA: Diagnosis present

## 2023-05-16 DIAGNOSIS — Z992 Dependence on renal dialysis: Secondary | ICD-10-CM | POA: Diagnosis not present

## 2023-05-16 DIAGNOSIS — Z66 Do not resuscitate: Secondary | ICD-10-CM | POA: Diagnosis present

## 2023-05-16 DIAGNOSIS — N2581 Secondary hyperparathyroidism of renal origin: Secondary | ICD-10-CM | POA: Diagnosis present

## 2023-05-16 DIAGNOSIS — E1122 Type 2 diabetes mellitus with diabetic chronic kidney disease: Secondary | ICD-10-CM | POA: Diagnosis present

## 2023-05-16 DIAGNOSIS — Z91158 Patient's noncompliance with renal dialysis for other reason: Secondary | ICD-10-CM

## 2023-05-16 DIAGNOSIS — F419 Anxiety disorder, unspecified: Secondary | ICD-10-CM | POA: Diagnosis present

## 2023-05-16 DIAGNOSIS — Z5329 Procedure and treatment not carried out because of patient's decision for other reasons: Secondary | ICD-10-CM | POA: Diagnosis present

## 2023-05-16 DIAGNOSIS — I120 Hypertensive chronic kidney disease with stage 5 chronic kidney disease or end stage renal disease: Secondary | ICD-10-CM | POA: Diagnosis present

## 2023-05-16 DIAGNOSIS — Z5902 Unsheltered homelessness: Secondary | ICD-10-CM | POA: Diagnosis not present

## 2023-05-16 DIAGNOSIS — F1410 Cocaine abuse, uncomplicated: Secondary | ICD-10-CM | POA: Diagnosis present

## 2023-05-16 DIAGNOSIS — B1920 Unspecified viral hepatitis C without hepatic coma: Secondary | ICD-10-CM | POA: Diagnosis present

## 2023-05-16 DIAGNOSIS — E872 Acidosis, unspecified: Secondary | ICD-10-CM | POA: Diagnosis present

## 2023-05-16 DIAGNOSIS — J440 Chronic obstructive pulmonary disease with acute lower respiratory infection: Secondary | ICD-10-CM | POA: Diagnosis present

## 2023-05-16 HISTORY — DX: Acute and chronic respiratory failure with hypoxia: J96.21

## 2023-05-16 HISTORY — DX: Acute pulmonary edema: J81.0

## 2023-05-16 HISTORY — DX: COVID-19: U07.1

## 2023-05-16 LAB — COMPREHENSIVE METABOLIC PANEL
ALT: 81 U/L — ABNORMAL HIGH (ref 0–44)
AST: 41 U/L (ref 15–41)
Albumin: 3.2 g/dL — ABNORMAL LOW (ref 3.5–5.0)
Alkaline Phosphatase: 96 U/L (ref 38–126)
Anion gap: 19 — ABNORMAL HIGH (ref 5–15)
BUN: 107 mg/dL — ABNORMAL HIGH (ref 8–23)
CO2: 16 mmol/L — ABNORMAL LOW (ref 22–32)
Calcium: 6.9 mg/dL — ABNORMAL LOW (ref 8.9–10.3)
Chloride: 96 mmol/L — ABNORMAL LOW (ref 98–111)
Creatinine, Ser: 6.03 mg/dL — ABNORMAL HIGH (ref 0.44–1.00)
GFR, Estimated: 7 mL/min — ABNORMAL LOW (ref >60)
Glucose, Bld: 96 mg/dL (ref 70–99)
Potassium: 3.9 mmol/L (ref 3.5–5.1)
Sodium: 131 mmol/L — ABNORMAL LOW (ref 135–145)
Total Bilirubin: 0.9 mg/dL (ref 0.0–1.2)
Total Protein: 6.7 g/dL (ref 6.5–8.1)

## 2023-05-16 LAB — CBC WITH DIFFERENTIAL/PLATELET
Abs Immature Granulocytes: 0.07 10*3/uL (ref 0.00–0.07)
Basophils Absolute: 0 10*3/uL (ref 0.0–0.1)
Basophils Relative: 0 %
Eosinophils Absolute: 0 10*3/uL (ref 0.0–0.5)
Eosinophils Relative: 0 %
HCT: 23.2 % — ABNORMAL LOW (ref 36.0–46.0)
Hemoglobin: 7.5 g/dL — ABNORMAL LOW (ref 12.0–15.0)
Immature Granulocytes: 1 %
Lymphocytes Relative: 9 %
Lymphs Abs: 0.8 10*3/uL (ref 0.7–4.0)
MCH: 30.6 pg (ref 26.0–34.0)
MCHC: 32.3 g/dL (ref 30.0–36.0)
MCV: 94.7 fL (ref 80.0–100.0)
Monocytes Absolute: 0.6 10*3/uL (ref 0.1–1.0)
Monocytes Relative: 8 %
Neutro Abs: 6.6 10*3/uL (ref 1.7–7.7)
Neutrophils Relative %: 82 %
Platelets: 256 10*3/uL (ref 150–400)
RBC: 2.45 MIL/uL — ABNORMAL LOW (ref 3.87–5.11)
RDW: 19.8 % — ABNORMAL HIGH (ref 11.5–15.5)
WBC: 8.1 10*3/uL (ref 4.0–10.5)
nRBC: 0 % (ref 0.0–0.2)

## 2023-05-16 LAB — I-STAT VENOUS BLOOD GAS, ED
Acid-base deficit: 7 mmol/L — ABNORMAL HIGH (ref 0.0–2.0)
Bicarbonate: 16.6 mmol/L — ABNORMAL LOW (ref 20.0–28.0)
Calcium, Ion: 0.76 mmol/L — CL (ref 1.15–1.40)
HCT: 27 % — ABNORMAL LOW (ref 36.0–46.0)
Hemoglobin: 9.2 g/dL — ABNORMAL LOW (ref 12.0–15.0)
O2 Saturation: 84 %
Potassium: 3.7 mmol/L (ref 3.5–5.1)
Sodium: 129 mmol/L — ABNORMAL LOW (ref 135–145)
TCO2: 17 mmol/L — ABNORMAL LOW (ref 22–32)
pCO2, Ven: 26.6 mm[Hg] — ABNORMAL LOW (ref 44–60)
pH, Ven: 7.404 (ref 7.25–7.43)
pO2, Ven: 47 mm[Hg] — ABNORMAL HIGH (ref 32–45)

## 2023-05-16 LAB — PROTIME-INR
INR: 1 (ref 0.8–1.2)
INR: 1.1 (ref 0.8–1.2)
Prothrombin Time: 13.6 s (ref 11.4–15.2)
Prothrombin Time: 14.3 s (ref 11.4–15.2)

## 2023-05-16 LAB — RESP PANEL BY RT-PCR (RSV, FLU A&B, COVID)  RVPGX2
Influenza A by PCR: NEGATIVE
Influenza B by PCR: NEGATIVE
Resp Syncytial Virus by PCR: NEGATIVE
SARS Coronavirus 2 by RT PCR: POSITIVE — AB

## 2023-05-16 LAB — MRSA NEXT GEN BY PCR, NASAL: MRSA by PCR Next Gen: NOT DETECTED

## 2023-05-16 LAB — GLUCOSE, CAPILLARY
Glucose-Capillary: 117 mg/dL — ABNORMAL HIGH (ref 70–99)
Glucose-Capillary: 139 mg/dL — ABNORMAL HIGH (ref 70–99)
Glucose-Capillary: 115 mg/dL — ABNORMAL HIGH (ref 70–99)

## 2023-05-16 LAB — I-STAT CG4 LACTIC ACID, ED
Lactic Acid, Venous: 1 mmol/L (ref 0.5–1.9)
Lactic Acid, Venous: 0.7 mmol/L (ref 0.5–1.9)

## 2023-05-16 LAB — HEMOGLOBIN A1C
Hgb A1c MFr Bld: 5 % (ref 4.8–5.6)
Mean Plasma Glucose: 96.8 mg/dL

## 2023-05-16 LAB — HEPATITIS B SURFACE ANTIGEN: Hepatitis B Surface Ag: NONREACTIVE

## 2023-05-16 LAB — APTT
aPTT: 22 s — ABNORMAL LOW (ref 24–36)
aPTT: 30 s (ref 24–36)

## 2023-05-16 LAB — CBG MONITORING, ED: Glucose-Capillary: 104 mg/dL — ABNORMAL HIGH (ref 70–99)

## 2023-05-16 MED ORDER — SODIUM CHLORIDE 0.9 % IV SOLN
1.0000 g | Freq: Once | INTRAVENOUS | Status: AC
  Administered 2023-05-16: 1 g via INTRAVENOUS
  Filled 2023-05-16: qty 10

## 2023-05-16 MED ORDER — FUROSEMIDE 10 MG/ML IJ SOLN
80.0000 mg | Freq: Once | INTRAMUSCULAR | Status: AC
  Administered 2023-05-16: 80 mg via INTRAVENOUS
  Filled 2023-05-16: qty 8

## 2023-05-16 MED ORDER — ORAL CARE MOUTH RINSE
15.0000 mL | OROMUCOSAL | Status: DC
  Administered 2023-05-16 – 2023-05-18 (×6): 15 mL via OROMUCOSAL

## 2023-05-16 MED ORDER — PIPERACILLIN-TAZOBACTAM 3.375 G IVPB 30 MIN
3.3750 g | Freq: Once | INTRAVENOUS | Status: AC
  Administered 2023-05-16: 3.375 g via INTRAVENOUS
  Filled 2023-05-16: qty 50

## 2023-05-16 MED ORDER — ADULT MULTIVITAMIN W/MINERALS CH
1.0000 | ORAL_TABLET | Freq: Every day | ORAL | Status: DC
  Administered 2023-05-17 – 2023-05-18 (×2): 1 via ORAL
  Filled 2023-05-16 (×2): qty 1

## 2023-05-16 MED ORDER — HEPARIN SODIUM (PORCINE) 5000 UNIT/ML IJ SOLN
5000.0000 [IU] | Freq: Three times a day (TID) | INTRAMUSCULAR | Status: DC
  Administered 2023-05-16 – 2023-05-17 (×5): 5000 [IU] via SUBCUTANEOUS
  Filled 2023-05-16 (×6): qty 1

## 2023-05-16 MED ORDER — ORAL CARE MOUTH RINSE: 15.0000 mL | OROMUCOSAL | Status: DC | PRN

## 2023-05-16 MED ORDER — LINEZOLID 600 MG/300ML IV SOLN
600.0000 mg | Freq: Two times a day (BID) | INTRAVENOUS | Status: DC
  Administered 2023-05-16 (×2): 600 mg via INTRAVENOUS
  Filled 2023-05-16 (×3): qty 300

## 2023-05-16 MED ORDER — METHYLPREDNISOLONE SODIUM SUCC 125 MG IJ SOLR
60.0000 mg | Freq: Every day | INTRAMUSCULAR | Status: DC
  Administered 2023-05-16 – 2023-05-18 (×3): 60 mg via INTRAVENOUS
  Filled 2023-05-16 (×3): qty 2

## 2023-05-16 MED ORDER — ARFORMOTEROL TARTRATE 15 MCG/2ML IN NEBU
15.0000 ug | INHALATION_SOLUTION | Freq: Two times a day (BID) | RESPIRATORY_TRACT | Status: DC
  Administered 2023-05-16 – 2023-05-18 (×4): 15 ug via RESPIRATORY_TRACT
  Filled 2023-05-16 (×4): qty 2

## 2023-05-16 MED ORDER — DOCUSATE SODIUM 100 MG PO CAPS: 100.0000 mg | ORAL_CAPSULE | Freq: Two times a day (BID) | ORAL | Status: DC | PRN

## 2023-05-16 MED ORDER — PIPERACILLIN-TAZOBACTAM IN DEX 2-0.25 GM/50ML IV SOLN
2.2500 g | Freq: Three times a day (TID) | INTRAVENOUS | Status: DC
  Administered 2023-05-16 – 2023-05-17 (×2): 2.25 g via INTRAVENOUS
  Filled 2023-05-16 (×3): qty 50

## 2023-05-16 MED ORDER — FENTANYL CITRATE PF 50 MCG/ML IJ SOSY
12.5000 ug | PREFILLED_SYRINGE | INTRAMUSCULAR | Status: DC | PRN
  Administered 2023-05-16 – 2023-05-18 (×9): 25 ug via INTRAVENOUS
  Filled 2023-05-16 (×9): qty 1

## 2023-05-16 MED ORDER — BUDESONIDE 0.5 MG/2ML IN SUSP
0.5000 mg | Freq: Two times a day (BID) | RESPIRATORY_TRACT | Status: DC
  Administered 2023-05-16 – 2023-05-18 (×4): 0.5 mg via RESPIRATORY_TRACT
  Filled 2023-05-16 (×4): qty 2

## 2023-05-16 MED ORDER — REVEFENACIN 175 MCG/3ML IN SOLN
175.0000 ug | Freq: Every day | RESPIRATORY_TRACT | Status: DC
  Administered 2023-05-17 – 2023-05-18 (×2): 175 ug via RESPIRATORY_TRACT
  Filled 2023-05-16 (×2): qty 3

## 2023-05-16 MED ORDER — DARBEPOETIN ALFA 200 MCG/0.4ML IJ SOSY
200.0000 ug | PREFILLED_SYRINGE | Freq: Once | INTRAMUSCULAR | Status: DC
  Filled 2023-05-16: qty 0.4

## 2023-05-16 MED ORDER — THIAMINE MONONITRATE 100 MG PO TABS
100.0000 mg | ORAL_TABLET | Freq: Every day | ORAL | Status: DC
  Administered 2023-05-17 – 2023-05-18 (×2): 100 mg via ORAL
  Filled 2023-05-16 (×2): qty 1

## 2023-05-16 MED ORDER — INSULIN ASPART 100 UNIT/ML IJ SOLN
0.0000 [IU] | INTRAMUSCULAR | Status: DC
  Administered 2023-05-16: 1 [IU] via SUBCUTANEOUS
  Administered 2023-05-17: 2 [IU] via SUBCUTANEOUS
  Administered 2023-05-17: 1 [IU] via SUBCUTANEOUS

## 2023-05-16 MED ORDER — FOLIC ACID 1 MG PO TABS
1.0000 mg | ORAL_TABLET | Freq: Every day | ORAL | Status: DC
  Administered 2023-05-17 – 2023-05-18 (×2): 1 mg via ORAL
  Filled 2023-05-16 (×2): qty 1

## 2023-05-16 MED ORDER — LORAZEPAM 2 MG/ML IJ SOLN
1.0000 mg | Freq: Once | INTRAMUSCULAR | Status: AC
  Administered 2023-05-17: 1 mg via INTRAVENOUS
  Filled 2023-05-16: qty 1

## 2023-05-16 MED ORDER — CHLORHEXIDINE GLUCONATE CLOTH 2 % EX PADS
6.0000 | MEDICATED_PAD | Freq: Every day | CUTANEOUS | Status: DC
  Administered 2023-05-17: 6 via TOPICAL

## 2023-05-16 MED ORDER — PANTOPRAZOLE SODIUM 40 MG IV SOLR
40.0000 mg | INTRAVENOUS | Status: DC
  Administered 2023-05-16 – 2023-05-17 (×2): 40 mg via INTRAVENOUS
  Filled 2023-05-16 (×2): qty 10

## 2023-05-16 MED ORDER — POLYETHYLENE GLYCOL 3350 17 G PO PACK: 17.0000 g | PACK | Freq: Every day | ORAL | Status: DC | PRN

## 2023-05-16 MED ORDER — CLONAZEPAM 1 MG PO TABS: 1.0000 mg | ORAL_TABLET | Freq: Every day | ORAL | Status: DC

## 2023-05-16 NOTE — Sepsis Progress Note (Signed)
 Code Sepsis protocol being monitored by eLink.

## 2023-05-16 NOTE — Progress Notes (Signed)
 Pharmacy Antibiotic Note  [name] is a 63 y.o. female admitted on 05/16/2023 presenting with concern for pna.  Pharmacy has been consulted for zosyn dosing.  Plan: Zosyn 3.375g IV x 1, then 2.25g IV q 8h Monitor renal function and nephro plans, Cx and clinical progression to narrow  Height: 5\' 7"  (170.2 cm) Weight: 56 kg (123 lb 7.3 oz) IBW/kg (Calculated) : 61.6  Temp (24hrs), Avg:99.7 F (37.6 C), Min:99.2 F (37.3 C), Max:100.1 F (37.8 C)  Recent Labs  Lab 05/10/23 0221 05/11/23 0539 05/12/23 0853 05/13/23 0439 05/16/23 0907 05/16/23 0916 05/16/23 1038  WBC 4.0 4.8 5.6  --  8.1  --   --   CREATININE 5.03* 5.00* 5.37* 5.34* 6.03*  --   --   LATICACIDVEN  --   --   --   --   --  1.0 0.7    Estimated Creatinine Clearance: 8.6 mL/min (A) (by C-G formula based on SCr of 6.03 mg/dL (H)).    Allergies  Allergen Reactions   Zestril [Lisinopril] Other (See Comments)    Hyperkalemia    Daylene Posey, PharmD, [facility] Clinical Pharmacist ED Pharmacist Phone # [phone] 05/16/2023 1:24 PM

## 2023-05-16 NOTE — Progress Notes (Addendum)
 eLink Physician-Brief Progress Note Patient Name: [name] DOB: 07-29-1960 MRN: [id number]   Date of Service  05/16/2023  HPI/Events of Note    eICU Interventions     Anxiety   We will give Klonopin   Hgb 6.8 No signs of bleed Hx of GIB  Will trend H&H     Bellarose Burtt 05/16/2023, 11:17 PM

## 2023-05-16 NOTE — ED Provider Notes (Signed)
 [place] EMERGENCY DEPARTMENT AT [facility] Provider Note   CSN: [id number] Arrival date & time: 05/16/23  4098     History  Chief Complaint  Patient presents with   Shortness of Breath    [name] is a 63 y.o. female.  Pt is a 63 y.o. 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  Patient reports that she started noticing swelling yesterday in her legs and abdomen and this morning she started feeling very short of breath but reports hurting all over and feeling like she is freezing.  She used her nebulizer yesterday but has noticed a productive cough in the last 24 hours as well.  EMS reports upon their arrival patient was satting in the 70s which improved to the 80s on nasal cannula and to the 90s on nonrebreather.  She was not given any intervention and route.  Patient denies any abdominal pain but does report she has had some diarrhea.  She is still making urine and denies any dysuria or frequency.  The history is provided by the patient, medical records and the EMS personnel.  Shortness of Breath      Home Medications Prior to Admission medications   Medication Sig Start Date End Date Taking? Authorizing Provider  albuterol (VENTOLIN HFA) 108 (90 Base) MCG/ACT inhaler Inhale 2 puffs into the lungs every 6 (six) hours as needed for wheezing or shortness of breath. Patient taking differently: Inhale 3 puffs into the lungs every 6 (six) hours as needed for wheezing or shortness of breath. 02/01/23   Ghimire, Werner Lean, MD  amLODipine (NORVASC) 10 MG tablet Take 1 tablet (10 mg total) by mouth daily.  05/13/23   Elgergawy, Leana Roe, MD  atorvastatin (LIPITOR) 10 MG tablet Take 1 tablet (10 mg total) by mouth daily. 05/13/23   Elgergawy, Leana Roe, MD  busPIRone (BUSPAR) 10 MG tablet Take 1 tablet (10 mg total) by mouth 2 (two) times daily. 02/10/23   Anders Simmonds, PA-C  ferrous sulfate 325 (65 FE) MG tablet Take 1 tablet (325 mg total) by mouth daily with breakfast. 02/01/23   Ghimire, Werner Lean, MD  Fluticasone-Umeclidin-Vilant (TRELEGY ELLIPTA) 100-62.5-25 MCG/ACT AEPB Inhale 1 puff into the lungs daily. 02/01/23   Ghimire, Werner Lean, MD  gabapentin (NEURONTIN) 100 MG capsule Take 1 capsule (100 mg total) by mouth 3 (three) times daily. 02/01/23   Ghimire, Werner Lean, MD  guaiFENesin (MUCINEX) 600 MG 12 hr tablet Take 1 tablet (600 mg total) by mouth 2 (two) times daily for 10 days. 05/13/23 05/23/23  Elgergawy, Leana Roe, MD  HYDROcodone-acetaminophen (NORCO/VICODIN) 5-325 MG tablet Take 1 tablet by mouth every 6 (six) hours as needed for severe pain (pain score 7-10). 05/13/23   Elgergawy, Leana Roe, MD  hydrOXYzine (ATARAX) 25 MG tablet Take 1 tablet (25 mg total) by mouth 3 (three) times daily as needed for anxiety or itching. 04/23/23   [name], MD  ipratropium-albuterol (DUONEB) 0.5-2.5 (3) MG/3ML SOLN Take 3 mLs by nebulization every 6 (six) hours as needed (shortness of breath and wheezing). 05/01/23   [name], MD  methylPREDNISolone (MEDROL DOSEPAK) 4 MG TBPK tablet follow  package directions 05/13/23   Elgergawy, Leana Roe, MD  multivitamin (RENA-VIT) TABS tablet Take 1 tablet by mouth at bedtime. 02/01/23   Ghimire, Werner Lean, MD  nicotine (NICODERM CQ - DOSED IN MG/24 HOURS) 21 mg/24hr patch Place 1 patch (21 mg total) onto the skin daily. 05/13/23   Elgergawy, Leana Roe, MD  pantoprazole (PROTONIX) 40 MG tablet Take 1 tablet (40 mg total) by mouth 2 (two) times daily. 05/13/23 08/11/23  Elgergawy, Leana Roe, MD  sodium bicarbonate 650 MG tablet Take 2 tablets (1,300 mg total) by  mouth 2 (two) times daily. 05/13/23   Elgergawy, Leana Roe, MD  torsemide (DEMADEX) 100 MG tablet Take 1 tablet (100 mg total) by mouth daily. 05/13/23   Elgergawy, Leana Roe, MD  traZODone (DESYREL) 50 MG tablet Take 1 tablet (50 mg total) by mouth at bedtime as needed for sleep. 05/13/23   Elgergawy, Leana Roe, MD      Allergies    Zestril [lisinopril]    Review of Systems   Review of Systems  Respiratory:  Positive for shortness of breath.     Physical Exam Updated Vital Signs BP (!) 160/87   Pulse (!) 105   Temp 100.1 F (37.8 C) (Axillary)   Resp (!) 22   Ht 5\' 7"  (1.702 m)   Wt 56 kg   SpO2 93%   BMI 19.34 kg/m  Physical Exam Vitals and nursing note reviewed.  Constitutional:      General: She is in acute distress.     Appearance: She is well-developed. She is ill-appearing.  HENT:     Head: Normocephalic and atraumatic.     Mouth/Throat:     Mouth: Mucous membranes are dry.  Eyes:     Pupils: Pupils are equal, round, and reactive to light.  Cardiovascular:     Rate and Rhythm: Regular rhythm. Tachycardia present.     Heart sounds: Normal heart sounds. No murmur heard.    No friction rub.  Pulmonary:     Effort: Pulmonary effort is normal. Tachypnea present.     Breath sounds: Rales present. No wheezing.  Abdominal:     General: Bowel sounds are normal. There is no distension.     Palpations: Abdomen is soft.     Tenderness: There is no abdominal tenderness. There is no guarding or rebound.     Comments: Edema over the abdomen.  Musculoskeletal:        General: No tenderness. Normal range of motion.     Right lower leg: Edema present.     Left lower leg: Edema present.     Comments: Pitting edema present in bilateral lower extremities.  Also pitting edema present in the upper extremities  Skin:    General: Skin is warm and dry.     Findings: No rash.  Neurological:     Mental Status: She is alert and oriented to person, place, and time.     Cranial Nerves: No  cranial nerve deficit.  Psychiatric:        Behavior: Behavior normal.     ED Results / Procedures / Treatments   Labs (all labs ordered are listed, but only abnormal results are displayed) Labs Reviewed  RESP PANEL BY RT-PCR (RSV, FLU A&B, COVID)  RVPGX2 - Abnormal; Notable for the following components:      Result Value   SARS Coronavirus 2 by RT PCR POSITIVE (*)    All other components within normal limits  COMPREHENSIVE METABOLIC PANEL -  Abnormal; Notable for the following components:   Sodium 131 (*)    Chloride 96 (*)    CO2 16 (*)    BUN 107 (*)    Creatinine, Ser 6.03 (*)    Calcium 6.9 (*)    Albumin 3.2 (*)    ALT 81 (*)    GFR, Estimated 7 (*)    Anion gap 19 (*)    All other components within normal limits  CBC WITH DIFFERENTIAL/PLATELET - Abnormal; Notable for the following components:   RBC 2.45 (*)    Hemoglobin 7.5 (*)    HCT 23.2 (*)    RDW 19.8 (*)    All other components within normal limits  I-STAT VENOUS BLOOD GAS, ED - Abnormal; Notable for the following components:   pCO2, Ven 26.6 (*)    pO2, Ven 47 (*)    Bicarbonate 16.6 (*)    TCO2 17 (*)    Acid-base deficit 7.0 (*)    Sodium 129 (*)    Calcium, Ion 0.76 (*)    HCT 27.0 (*)    Hemoglobin 9.2 (*)    All other components within normal limits  CULTURE, BLOOD (ROUTINE X 2)  CULTURE, BLOOD (ROUTINE X 2)  URINALYSIS, W/ REFLEX TO CULTURE (INFECTION SUSPECTED)  PROTIME-INR  APTT  I-STAT CG4 LACTIC ACID, ED  I-STAT CG4 LACTIC ACID, ED    EKG EKG Interpretation Date/Time:  Friday May 16 2023 08:58:20 EST Ventricular Rate:  110 PR Interval:  149 QRS Duration:  79 QT Interval:  348 QTC Calculation: 471 R Axis:   53  Text Interpretation: Sinus tachycardia with irregular rate Minimal ST depression, lateral leads No significant change since last tracing Confirmed by Gwyneth Sprout ([id number]) on 05/16/2023 9:06:35 AM  Radiology No results found.  Procedures Procedures     Medications Ordered in ED Medications  cefTRIAXone (ROCEPHIN) 1 g in sodium chloride 0.9 % 100 mL IVPB (0 g Intravenous Stopped 05/16/23 1023)    ED Course/ Medical Decision Making/ A&P                                 Medical Decision Making Amount and/or Complexity of Data Reviewed Independent Historian: EMS External Data Reviewed: notes. Labs: ordered. Decision-making details documented in ED Course. Radiology: ordered and independent interpretation performed. Decision-making details documented in ED Course. ECG/medicine tests: ordered and independent interpretation performed. Decision-making details documented in ED Course.  Risk Decision regarding hospitalization.   Pt with multiple medical problems and comorbidities and presenting today with a complaint that caries a high risk for morbidity and mortality.  Returning today with shortness of breath, hypoxia and general malaise and bodyaches who is found to have a fever of 100.1 orally, tachycardia, significant hypertension and hypoxia.  Patient was "requiring nonrebreather to maintain oxygen saturation greater than 90%.  Patient also appears significantly fluid overloaded.  Concern for sepsis, viral etiology such as flu as she was recently hospitalized.  She denies any further GI bleeding but she did have significant anemia with a hemoglobin down to 4 during her last hospitalization requiring 3 units of blood.  In addition to all of this patient has advanced kidney disease and has refused dialysis.  It was addressed during her last hospitalization just last week and she still continued to refuse dialysis.  She is noted to be a DNR and DNI as well.  Will change her over to high flow nasal  cannula for comfort to hopefully maintain saturations.  Sepsis order set was initiated and patient was given Rocephin while waiting for the final results.  I independently interpreted patient's EKG and labs.  EKG was sinus tachycardia without significant  ST changes, CBC with normal white count and hemoglobin of 7.5 which has come down from 9 during her last hospitalization with a normal platelet count, lactic acid is normal, patient is COVID-positive today and blood gas currently with normal pH and CO2 of 26.  Patient has a low bicarb however that is felt to be related to her chronic kidney disease.  CMP with AKI today with creatinine of 6 from her baseline of 5 with normal potassium and sodium of 131.  Patient is now on high flow nasal cannula of 15.  I have independently visualized and interpreted pt's images today.  X-ray with patchy bilateral infiltrates which suspect is related to fluid overload as well as possible infection.  She was initially covered with Rocephin however feel that this is all viral.  Discussed with the patient and she confirms that she is still DNR/DNI but reports if she absolutely had to have dialysis in the short-term to survive she would want to have that done.  Will discuss patient's case with ICU given the complexity of her medical history and current situation.  Patient is comfortable with this plan.  CRITICAL CARE Performed by: Shantia Sanford Total critical care time: 40 minutes Critical care time was exclusive of separately billable procedures and treating other patients. Critical care was necessary to treat or prevent imminent or life-threatening deterioration. Critical care was time spent personally by me on the following activities: development of treatment plan with patient and/or surrogate as well as nursing, discussions with consultants, evaluation of patient's response to treatment, examination of patient, obtaining history from patient or surrogate, ordering and performing treatments and interventions, ordering and review of laboratory studies, ordering and review of radiographic studies, pulse oximetry and re-evaluation of patient's condition.           Final Clinical Impression(s) / ED Diagnoses Final  diagnoses:  COVID  Acute respiratory failure with hypoxia (HCC)  AKI (acute kidney injury) (HCC)    Rx / DC Orders ED Discharge Orders     None         Gwyneth Sprout, MD 05/16/23 1046

## 2023-05-16 NOTE — ED Notes (Signed)
 Per MD able to give antibiotics, due to multiple attempts to obtain blood cultures and failure to obtain.

## 2023-05-16 NOTE — Consult Note (Addendum)
 [facility] Renal Consultation Note    Indication for Consultation:  Management of ESRD/hemodialysis; anemia, hypertension/volume and secondary hyperparathyroidism PCP: Dr. Jonah Blue.   HPI: [name] is a 63 y.o. female with ESRD, last hemodialysis 02/10/2023. PMH ETOH abuse, polysubstance abuse, HTN, COPD, untreated hepatitis C, homelessness. She was admitted to hospital 02/20-02/25/2025 for UGIB. She returned to ED this AM with C/O SOB, chills and body aches. Respiratory panel + for COVID 19. CXR- Increasing lung base opacities and small effusions. Na+ 131 Co2 16 SCr 6.03 BUN 107 EGFR 7 WBC 8.1 HGB 7.5.   She has been seen by PCCM and is being admitted for COVID PNA. She is on high flow [place] O2 sats 93% RR 24-26 shallow. She is awake currently on bed pan. She denies diarrhea. Speech is tangential, can't really give me a time line for current illness. Asked is she wants to do resume dialysis and she tells me that she lives on streets and has no place to go. Asked again if she  wishes to do dialysis and she says, "Yes, I'll do it today". C/O generalized pain, asking to get off bed pan. Called for RN to assist. Will order HD for today.   Past Surgical History:  Procedure Laterality Date   A/V FISTULAGRAM Right 01/31/2023   Procedure: A/V Fistulagram;  Surgeon: Cephus Shelling, MD;  Location: [facility] INVASIVE CV LAB;  Service: Cardiovascular;  Laterality: Right;   AV FISTULA PLACEMENT Right 08/14/2022   Procedure: RIGHT ARM BRACHIOBASILIC ATERIOVENOUS FISTULA CREATION;  Surgeon: Leonie Douglas, MD;  Location: [facility] OR;  Service: Vascular;  Laterality: Right;   BASCILIC VEIN TRANSPOSITION Right 11/13/2022   Procedure: RIGHT ARM SECOND STAGE BASILIC VEIN TRANSPOSITION;  Surgeon: Leonie Douglas, MD;  Location: [facility] OR;  Service: Vascular;  Laterality: Right;   BIOPSY  01/01/2019   Procedure: BIOPSY;  Surgeon: Beverley Fiedler, MD;  Location: [facility] ENDOSCOPY;  Service: Endoscopy;;    BIOPSY  11/17/2021   Procedure: BIOPSY;  Surgeon: Lynann Bologna, MD;  Location: [facility] ENDOSCOPY;  Service: Gastroenterology;;   CESAREAN SECTION  1989   COLONOSCOPY  11/07/2020   2018   CYSTOSCOPY W/ URETERAL STENT PLACEMENT Left 08/01/2018   Procedure: CYSTOSCOPY WITH RETROGRADE PYELOGRAM/URETERAL STENT PLACEMENT;  Surgeon: Crista Elliot, MD;  Location: [facility] ORS;  Service: Urology;  Laterality: Left;   CYSTOSCOPY WITH RETROGRADE PYELOGRAM, URETEROSCOPY AND STENT PLACEMENT Left 01/15/2019   Procedure: CYSTOSCOPY WITH RETROGRADE PYELOGRAM, URETEROSCOPY AND STENT PLACEMENT;  Surgeon: Crista Elliot, MD;  Location: [facility] ORS;  Service: Urology;  Laterality: Left;   ENTEROSCOPY N/A 08/16/2022   Procedure: ENTEROSCOPY;  Surgeon: Meridee Score Netty Starring., MD;  Location: [facility] ENDOSCOPY;  Service: Gastroenterology;  Laterality: N/A;   ENTEROSCOPY N/A 10/31/2022   Procedure: ENTEROSCOPY;  Surgeon: Beverley Fiedler, MD;  Location: [facility] ENDOSCOPY;  Service: Gastroenterology;  Laterality: N/A;   ENTEROSCOPY N/A 05/09/2023   Procedure: ENTEROSCOPY;  Surgeon: [name], MD;  Location: [facility] ENDOSCOPY;  Service: Gastroenterology;  Laterality: N/A;   ESOPHAGOGASTRODUODENOSCOPY (EGD) WITH PROPOFOL N/A 01/01/2019   Procedure: ESOPHAGOGASTRODUODENOSCOPY (EGD) WITH PROPOFOL;  Surgeon: Beverley Fiedler, MD;  Location: [facility] ENDOSCOPY;  Service: Endoscopy;  Laterality: N/A;   ESOPHAGOGASTRODUODENOSCOPY (EGD) WITH PROPOFOL N/A 08/21/2019   Procedure: ESOPHAGOGASTRODUODENOSCOPY (EGD) WITH PROPOFOL;  Surgeon: Napoleon Form, MD;  Location: [facility] ENDOSCOPY;  Service: Endoscopy;  Laterality: N/A;   ESOPHAGOGASTRODUODENOSCOPY (EGD) WITH PROPOFOL N/A 11/17/2021   Procedure: ESOPHAGOGASTRODUODENOSCOPY (EGD) WITH PROPOFOL;  Surgeon:  Lynann Bologna, MD;  Location: [facility] ENDOSCOPY;  Service: Gastroenterology;  Laterality: N/A;   FRACTURE SURGERY Left 2011   arm   HEMOSTASIS CLIP PLACEMENT  08/16/2022   Procedure: HEMOSTASIS CLIP PLACEMENT;   Surgeon: Lemar Lofty., MD;  Location: [facility] ENDOSCOPY;  Service: Gastroenterology;;   HOT HEMOSTASIS N/A 08/16/2022   Procedure: HOT HEMOSTASIS (ARGON PLASMA COAGULATION/BICAP);  Surgeon: Lemar Lofty., MD;  Location: [facility] ENDOSCOPY;  Service: Gastroenterology;  Laterality: N/A;   HOT HEMOSTASIS N/A 10/31/2022   Procedure: HOT HEMOSTASIS (ARGON PLASMA COAGULATION/BICAP);  Surgeon: Beverley Fiedler, MD;  Location: [facility] ENDOSCOPY;  Service: Gastroenterology;  Laterality: N/A;   HOT HEMOSTASIS N/A 05/09/2023   Procedure: HOT HEMOSTASIS (ARGON PLASMA COAGULATION/BICAP);  Surgeon: [name], MD;  Location: [facility] ENDOSCOPY;  Service: Gastroenterology;  Laterality: N/A;   INSERTION OF DIALYSIS CATHETER Right 08/14/2022   Procedure: INSERTION OF TUNNELED DIALYSIS CATHETER USING PALINDROME CATHETER KIT 19CM;  Surgeon: Leonie Douglas, MD;  Location: [facility] OR;  Service: Vascular;  Laterality: Right;   POLYPECTOMY  08/16/2022   Procedure: POLYPECTOMY;  Surgeon: Meridee Score Netty Starring., MD;  Location: [facility] ENDOSCOPY;  Service: Gastroenterology;;   SUBMUCOSAL TATTOO INJECTION  08/16/2022   Procedure: SUBMUCOSAL TATTOO INJECTION;  Surgeon: Lemar Lofty., MD;  Location: [facility] ENDOSCOPY;  Service: Gastroenterology;;   UPPER GASTROINTESTINAL ENDOSCOPY     Family History  Problem Relation Age of Onset   Diabetes Father    Colon cancer Neg Hx    Esophageal cancer Neg Hx    Rectal cancer Neg Hx    Stomach cancer Neg Hx    Social History:  reports that she has been smoking cigarettes. She has been exposed to tobacco smoke. She has never used smokeless tobacco. She reports that she does not currently use alcohol. She reports current drug use. Drug: Marijuana. Allergies  Allergen Reactions   Zestril [Lisinopril] Other (See Comments)    Hyperkalemia   Prior to Admission medications   Medication Sig Start Date End Date Taking? Authorizing Provider  albuterol (VENTOLIN HFA) 108 (90 Base) MCG/ACT  inhaler Inhale 2 puffs into the lungs every 6 (six) hours as needed for wheezing or shortness of breath. Patient taking differently: Inhale 3 puffs into the lungs every 6 (six) hours as needed for wheezing or shortness of breath. 02/01/23   Ghimire, Werner Lean, MD  amLODipine (NORVASC) 10 MG tablet Take 1 tablet (10 mg total) by mouth daily. 05/13/23   Elgergawy, Leana Roe, MD  atorvastatin (LIPITOR) 10 MG tablet Take 1 tablet (10 mg total) by mouth daily. 05/13/23   Elgergawy, Leana Roe, MD  busPIRone (BUSPAR) 10 MG tablet Take 1 tablet (10 mg total) by mouth 2 (two) times daily. 02/10/23   Anders Simmonds, PA-C  ferrous sulfate 325 (65 FE) MG tablet Take 1 tablet (325 mg total) by mouth daily with breakfast. 02/01/23   Ghimire, Werner Lean, MD  Fluticasone-Umeclidin-Vilant (TRELEGY ELLIPTA) 100-62.5-25 MCG/ACT AEPB Inhale 1 puff into the lungs daily. 02/01/23   Ghimire, Werner Lean, MD  gabapentin (NEURONTIN) 100 MG capsule Take 1 capsule (100 mg total) by mouth 3 (three) times daily. 02/01/23   Ghimire, Werner Lean, MD  guaiFENesin (MUCINEX) 600 MG 12 hr tablet Take 1 tablet (600 mg total) by mouth 2 (two) times daily for 10 days. 05/13/23 05/23/23  Elgergawy, Leana Roe, MD  HYDROcodone-acetaminophen (NORCO/VICODIN) 5-325 MG tablet Take 1 tablet by mouth every 6 (six) hours as needed for severe pain (pain score 7-10). 05/13/23  Elgergawy, Leana Roe, MD  hydrOXYzine (ATARAX) 25 MG tablet Take 1 tablet (25 mg total) by mouth 3 (three) times daily as needed for anxiety or itching. 04/23/23   [name], MD  ipratropium-albuterol (DUONEB) 0.5-2.5 (3) MG/3ML SOLN Take 3 mLs by nebulization every 6 (six) hours as needed (shortness of breath and wheezing). 05/01/23   [name], MD  methylPREDNISolone (MEDROL DOSEPAK) 4 MG TBPK tablet follow package directions 05/13/23   Elgergawy, Leana Roe, MD  multivitamin (RENA-VIT) TABS tablet Take 1 tablet by mouth at bedtime. 02/01/23   Ghimire, Werner Lean, MD  nicotine  (NICODERM CQ - DOSED IN MG/24 HOURS) 21 mg/24hr patch Place 1 patch (21 mg total) onto the skin daily. 05/13/23   Elgergawy, Leana Roe, MD  pantoprazole (PROTONIX) 40 MG tablet Take 1 tablet (40 mg total) by mouth 2 (two) times daily. 05/13/23 08/11/23  Elgergawy, Leana Roe, MD  sodium bicarbonate 650 MG tablet Take 2 tablets (1,300 mg total) by mouth 2 (two) times daily. 05/13/23   Elgergawy, Leana Roe, MD  torsemide (DEMADEX) 100 MG tablet Take 1 tablet (100 mg total) by mouth daily. 05/13/23   Elgergawy, Leana Roe, MD  traZODone (DESYREL) 50 MG tablet Take 1 tablet (50 mg total) by mouth at bedtime as needed for sleep. 05/13/23   Elgergawy, Leana Roe, MD   Current Facility-Administered Medications  Medication Dose Route Frequency Provider Last Rate Last Admin   docusate sodium (COLACE) capsule 100 mg  100 mg Oral BID PRN Duayne Cal, NP       furosemide (LASIX) injection 80 mg  80 mg Intravenous Once Duayne Cal, NP       heparin injection 5,000 Units  5,000 Units Subcutaneous Q8H Duayne Cal, NP       insulin aspart (novoLOG) injection 0-9 Units  0-9 Units Subcutaneous Q4H Duayne Cal, NP       linezolid (ZYVOX) IVPB 600 mg  600 mg Intravenous Q12H Duayne Cal, NP       methylPREDNISolone sodium succinate (SOLU-MEDROL) 125 mg/2 mL injection 60 mg  60 mg Intravenous Daily Duayne Cal, NP       piperacillin-tazobactam (ZOSYN) IVPB 2.25 g  2.25 g Intravenous Q8H Daylene Posey, RPH       piperacillin-tazobactam (ZOSYN) IVPB 3.375 g  3.375 g Intravenous Once Daylene Posey, RPH       polyethylene glycol (MIRALAX / GLYCOLAX) packet 17 g  17 g Oral Daily PRN Duayne Cal, NP       Current Outpatient Medications  Medication Sig Dispense Refill   albuterol (VENTOLIN HFA) 108 (90 Base) MCG/ACT inhaler Inhale 2 puffs into the lungs every 6 (six) hours as needed for wheezing or shortness of breath. (Patient taking differently: Inhale 3 puffs into the lungs every 6 (six) hours as needed  for wheezing or shortness of breath.) 18 g 1   amLODipine (NORVASC) 10 MG tablet Take 1 tablet (10 mg total) by mouth daily. 30 tablet 0   atorvastatin (LIPITOR) 10 MG tablet Take 1 tablet (10 mg total) by mouth daily. 30 tablet 0   busPIRone (BUSPAR) 10 MG tablet Take 1 tablet (10 mg total) by mouth 2 (two) times daily. 60 tablet 3   ferrous sulfate 325 (65 FE) MG tablet Take 1 tablet (325 mg total) by mouth daily with breakfast. 100 tablet 3   Fluticasone-Umeclidin-Vilant (TRELEGY ELLIPTA) 100-62.5-25 MCG/ACT AEPB Inhale 1 puff into the lungs daily. 60 each 3  gabapentin (NEURONTIN) 100 MG capsule Take 1 capsule (100 mg total) by mouth 3 (three) times daily. 90 capsule 3   guaiFENesin (MUCINEX) 600 MG 12 hr tablet Take 1 tablet (600 mg total) by mouth 2 (two) times daily for 10 days. 20 tablet 0   HYDROcodone-acetaminophen (NORCO/VICODIN) 5-325 MG tablet Take 1 tablet by mouth every 6 (six) hours as needed for severe pain (pain score 7-10). 10 tablet 0   hydrOXYzine (ATARAX) 25 MG tablet Take 1 tablet (25 mg total) by mouth 3 (three) times daily as needed for anxiety or itching. 90 tablet 0   ipratropium-albuterol (DUONEB) 0.5-2.5 (3) MG/3ML SOLN Take 3 mLs by nebulization every 6 (six) hours as needed (shortness of breath and wheezing). 360 mL 6   methylPREDNISolone (MEDROL DOSEPAK) 4 MG TBPK tablet follow package directions 21 tablet 0   multivitamin (RENA-VIT) TABS tablet Take 1 tablet by mouth at bedtime. 100 tablet 0   nicotine (NICODERM CQ - DOSED IN MG/24 HOURS) 21 mg/24hr patch Place 1 patch (21 mg total) onto the skin daily.     pantoprazole (PROTONIX) 40 MG tablet Take 1 tablet (40 mg total) by mouth 2 (two) times daily. 180 tablet 0   sodium bicarbonate 650 MG tablet Take 2 tablets (1,300 mg total) by mouth 2 (two) times daily. 120 tablet 0   torsemide (DEMADEX) 100 MG tablet Take 1 tablet (100 mg total) by mouth daily. 30 tablet 0   traZODone (DESYREL) 50 MG tablet Take 1 tablet (50  mg total) by mouth at bedtime as needed for sleep. 30 tablet 0   Labs: Basic Metabolic Panel: Recent Labs  Lab 05/12/23 0853 05/13/23 0439 05/16/23 0907 05/16/23 1032  NA 136 137 131* 129*  K 4.1 4.0 3.9 3.7  CL 106 102 96*  --   CO2 15* 19* 16*  --   GLUCOSE 104* 89 96  --   BUN 86* 98* 107*  --   CREATININE 5.37* 5.34* 6.03*  --   CALCIUM 7.8* 7.3* 6.9*  --    Liver Function Tests: Recent Labs  Lab 05/10/23 0221 05/16/23 0907  AST 86* 41  ALT 65* 81*  ALKPHOS 86 96  BILITOT 0.6 0.9  PROT 5.3* 6.7  ALBUMIN 2.5* 3.2*   No results for input(s): "LIPASE", "AMYLASE" in the last 168 hours. No results for input(s): "AMMONIA" in the last 168 hours. CBC: Recent Labs  Lab 05/10/23 0221 05/11/23 0539 05/12/23 0853 05/16/23 0907 05/16/23 1032  WBC 4.0 4.8 5.6 8.1  --   NEUTROABS  --   --   --  6.6  --   HGB 7.4* 8.7* 9.2* 7.5* 9.2*  HCT 22.2* 25.6* 26.6* 23.2* 27.0*  MCV 90.6 88.6 89.3 94.7  --   PLT 197 206 234 256  --    Cardiac Enzymes: No results for input(s): "CKTOTAL", "CKMB", "CKMBINDEX", "TROPONINI" in the last 168 hours. CBG: Recent Labs  Lab 05/09/23 1510 05/16/23 1318  GLUCAP 147* 104*   Iron Studies: No results for input(s): "IRON", "TIBC", "TRANSFERRIN", "FERRITIN" in the last 72 hours. Studies/Results: DG Chest Port 1 View Result Date: 05/16/2023 CLINICAL DATA:  Sepsis EXAM: PORTABLE CHEST 1 VIEW COMPARISON:  05/08/2023 FINDINGS: Overlapping cardiac leads. Enlarged cardiopericardial silhouette with vascular congestion. Question trace edema. There are bilateral small pleural effusions with adjacent mild opacities, right greater than left. No pneumothorax. Overlapping cardiac leads. IMPRESSION: Increasing lung base opacities and small effusions. Enlarged heart with vascular congestion and question trace edema. Recommend  follow-up Electronically Signed   By: Karen Kays M.D.   On: 05/16/2023 10:55    ROS: As per HPI otherwise negative.   Physical  Exam: Vitals:   05/16/23 1045 05/16/23 1100 05/16/23 1206 05/16/23 1300  BP: (!) 166/86 126/84  134/76  Pulse: (!) 110 (!) 106 (!) 102 96  Resp: (!) 26 19 (!) 21 18  Temp:    99.2 F (37.3 C)  TempSrc:      SpO2: 93% 96% 93% 98%  Weight:      Height:         General: Disheveled older female in mild distress. Head: Normocephalic, atraumatic, sclera non-icteric, mucus membranes are moist Neck: Supple. JVD not elevated. Lungs: Mild Tachypnea, RR 24-26. Bilateral breath sounds with scattered coarse upper airway rhonchi, decreased in bases.  Heart: Tachy S1,S2 RRR No M/R/G Abdomen: Flat, NT, NABS ? Ascites.  Lower extremities: trace BLE edema,  Neuro: Alert and oriented X 3. Moves all extremities spontaneously. Psych:  Responds to questions appropriately with a normal affect. Dialysis Access: L AVF + T/B  No OP Dialysis Center  Assessment/Plan:  Acute hypoxic respiratory failure in setting of COVID 19, volume overload. She says she will do dialysis if it will save her life but does not say she will continue dialysis. ABX for PNA, treatment for COVID per PCCM.   ESRD -  Has refused HD as recently as 04/19/2023 but today says she wishes to start HD today. Will start as new start to HD. Metabolic acidosis-has been prescribed NaHCO3 tabs as OP. Will correct with HD today. Hold bicarb tabs.   Hypertension/volume  - Given IV furosemide in ED to temporize but will attempt 1-1.5 liters in HD today. Continue amlodipine 10 mg PO Q hs.    Anemia  - HGB 7.5 per CBC 9.2 per H & H. Rec'd 4 units PRBCS last hospitalization. Tsat 8% but unable to give Fe while on ABX. Will give ESA with HD today.   Metabolic bone disease - Add PO4 to lab. Corrected calcium 7.5. No binders or VDRA. Order PTH.   Nutrition - NPO at present  COPD-per primary  ETOH/Polysubstance abuse-Monitor closely for withdrawal.   Dene Gentry. Manson Passey, NP-C 05/16/2023, 1:45 PM  [employer] [phone]     Seen  and examined independently.  Agree with note and exam as documented above by physician extender and as noted here.  Patient with ESRD, alcohol use, polysubstance abuse, hypertension, COPD, and homelessness who presented to the hospital with shortness of breath.  She states that she could not breathe "all of a sudden".  She is unable to provide much history.  She was found to be covid positive.  She has been off of dialysis for several months due to noncompliance and was recently reclassified as CKD stage V due to the same.  She has been on high flow nasal cannula.  Her CXR demonstrated increased lung base opacities and small effusions.     General adult female in bed with respiratory distress  HEENT normocephalic atraumatic extraocular movements intact sclera anicteric Neck supple trachea midline Lungs reduced breath sounds and increased work of breathing on high flow nasal cannula  Heart S1S2 no rub Abdomen soft nontender nondistended Extremities 1+ edema lower extremities Psych appropriate mood and affect Neuro she is conversant and speech is limited by respiratory status Access right upper extremity AV fistula with bruit and thrill  # ESRD - HD today using AVF  - We discussed  that I do believe she has end-stage renal disease and will continue to need hemodialysis in order to stay alive.  At this point, the patient indicates that she does want dialysis today and states that she is going to think about what she wants to do in the future.  She states "I have so many problems" and " I do not have a place to live."  We discussed that stopping dialysis and transitioning to comfort measures is also an option.  She does want HD today  - Note that she has likely lost some of her limited reserve - Would consult palliative care for continued assistance.  Her stated goals are conflicting.  Ultimately, she seems to wishes that she didn't need dialysis however requires it to stay alive  # Acute hypoxic  respiratory failure  - Secondary to COVID-19 pneumonia as well as fluid overload in the setting of dialysis noncompliance - Optimize volume status with HD  # Covid 19 PNA - optimize volume status with HD  - Therapies per primary team - on solumedrol   # Metabolic acidosis - for HD today    # HTN  - Optimize volume status with HD   # Anemia of CKD - aranesp ordered for today   Disposition - ICU admission   Estanislado Emms, MD 05/16/2023  3:37 PM

## 2023-05-16 NOTE — H&P (Addendum)
 [name], MRN:  [id number], DOB:  1960-08-10, LOS: 0 ADMISSION DATE:  05/16/2023, CONSULTATION DATE:  2/28 REFERRING MD:  Dr. 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  She was recently admitted from 2/20 to 2/25 with a primary diagnosis of acute blood loss anemia secondary to upper GI bleed in the setting of multiple angiectasia's in the stomach duodenum and jejunum.  She did receive a total of 3 unit packed red cells for her anemia.  Underwent upper endoscopy with APC treatment.  Also during that admission she was treated for COPD exacerbation.  She was also found to have a metabolic acidosis and hypervolemia secondary to his chronic kidney disease for which she was treated with diuresis as she does not not want to pursue any further dialysis treatment.  She was discharged on 12/25, however, she returned to [facility] emergency department on 2/28 with complaints of worsening abdominal and lower extremity swelling as well as shortness of breath.  Denies cough but complained of fever and chills.  Upon EMS arrival oxygen saturations were in the 70s and improved to the 90s on a nonrebreather.  Workup in the emergency department included a chest x-ray showing patchy bilateral infiltrates concerning for pulmonary edema versus infection.  She did test positive for COVID-19.  Rocephin was administered in the emergency department.  Given complexity of medical issues and high oxygen requirement PCCM was asked to evaluate for admission.  Of note patient is a smoker, reports quitting alcohol 1 year ago, but still does use cocaine last use was 2/27 the day before presentation.  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   Significant Hospital Events: Including procedures, antibiotic start and stop dates in addition to other pertinent events   2/20-2/25 admitted for GI bleeding.  2/28 admitted for respiratory failure  Interim History / Subjective:    Objective   Blood pressure (!) 166/86, pulse (!) 110, temperature 100.1 F (37.8 C), temperature source Axillary, resp. rate (!) 26, height 5\' 7"  (1.702 m), weight 56 kg, SpO2 93%.       No intake or output data in the 24 hours ending 05/16/23 1122 Filed Weights   05/16/23 0859 05/16/23 0945  Weight: 56 kg 56 kg    Examination: General: adult female in mild distress HENT: [place]/AT, PERRL, no JVD Lungs: Coarse throughout. Tachypneic. 15L salter high flow satting in low 90s.  Cardiovascular: Tachy, regular, no MRG Abdomen: Soft, NT, ND. Diffuse anasarca Extremities:No acute deformity. Trace edema.  Neuro: Alert, oriented, non-focal. Some what flight of ideas, but not too bad.   Resolved Hospital Problem list     Assessment & Plan:   Acute respiratory failure withy hypoxia: satting 70s on room air. Multifactorial. COVID, volume overload. Cannot rule out bacterial PNA. Acidosis also likely driving work of breathing some.  COVID 19 pneumonia Pulmonary edema - admit to ICU for close monitoring - Transition to heated high flow nasal  cannula for work of breathing.  - Doubt she would tolerate BiPAP well - She has been DNR/DNI in the past, but is now telling me should would accept short term intubation if it would keep her alive.  - Steroids - Triple neb  therapy - Recent hospitalization so will escalate antibiotics for HAP.  - Will attempt diuresis with high dose lasix x 1. If fails will likely need to consider HD. Will d/w nephrology.  CKD IV AG acidosis secondary to renal failure:  - try lasix - Trend BMP - she is open to life saving HD, despite having refused HD for several months now. Will discuss with nephro. Needing ICU.  Cocaine abuse:  - does not seem willing to quit  Upper GIB: admitted 2/20 > 2/25 - continue PPI  Goals of care: have been addressed on multiple occasions. Code status has gone back and forth. She tole Dr. Anitra Lauth in the ED, she would not want intubation or CPR. She tells me she wants all possible measures to sustain her life including CPR and intubation short term.   Best Practice (right click and "Reselect all SmartList Selections" daily)   Diet/type: NPO DVT prophylaxis prophylactic heparin  Pressure ulcer(s): present on admission sacral GI prophylaxis: PPI Lines: N/A Foley:  N/A Code Status:  full code Last date of multidisciplinary goals of care discussion [D/w patient in ED. Open to intubation/CPR if necessary to save life]  Labs   CBC: Recent Labs  Lab 05/10/23 0221 05/11/23 0539 05/12/23 0853 05/16/23 0907 05/16/23 1032  WBC 4.0 4.8 5.6 8.1  --   NEUTROABS  --   --   --  6.6  --   HGB 7.4* 8.7* 9.2* 7.5* 9.2*  HCT 22.2* 25.6* 26.6* 23.2* 27.0*  MCV 90.6 88.6 89.3 94.7  --   PLT 197 206 234 256  --     Basic Metabolic Panel: Recent Labs  Lab 05/10/23 0221 05/11/23 0539 05/12/23 0853 05/13/23 0439 05/16/23 0907 05/16/23 1032  NA 141 136 136 137 131* 129*  K 4.1 4.2 4.1 4.0 3.9 3.7  CL 109 107 106 102 96*  --   CO2 14* 13* 15* 19* 16*  --   GLUCOSE 88 102* 104* 89 96  --   BUN 79* 75* 86* 98* 107*  --   CREATININE 5.03* 5.00* 5.37* 5.34* 6.03*  --   CALCIUM 8.9 8.6* 7.8* 7.3* 6.9*  --    GFR: Estimated Creatinine Clearance: 8.6 mL/min (A) (by C-G formula based on SCr of  6.03 mg/dL (H)). Recent Labs  Lab 05/10/23 0221 05/11/23 0539 05/12/23 0853 05/16/23 0907 05/16/23 0916 05/16/23 1038  WBC 4.0 4.8 5.6 8.1  --   --   LATICACIDVEN  --   --   --   --  1.0 0.7    Liver Function Tests: Recent Labs  Lab 05/10/23 0221 05/16/23 0907  AST 86* 41  ALT 65* 81*  ALKPHOS 86 96  BILITOT 0.6 0.9  PROT 5.3* 6.7  ALBUMIN 2.5* 3.2*   No results for input(s): "LIPASE", "AMYLASE" in the last 168 hours. No results for input(s): "AMMONIA" in the last 168 hours.  ABG    Component Value Date/Time   PHART 7.310 (L) 10/25/2022 1354   PCO2ART 15.2 (LL) 10/25/2022 1354   PO2ART 193 (H) 10/25/2022 1354   HCO3 16.6 (L) 05/16/2023 1032   TCO2 17 (L) 05/16/2023 1032   ACIDBASEDEF 7.0 (H) 05/16/2023 1032   O2SAT 84 05/16/2023 1032  Coagulation Profile: No results for input(s): "INR", "PROTIME" in the last 168 hours.  Cardiac Enzymes: No results for input(s): "CKTOTAL", "CKMB", "CKMBINDEX", "TROPONINI" in the last 168 hours.  HbA1C: Hgb A1c MFr Bld  Date/Time Value Ref Range Status  10/25/2022 08:53 PM 4.6 (L) 4.8 - 5.6 % Final    Comment:    (NOTE) Pre diabetes:          5.7%-6.4%  Diabetes:              >6.4%  Glycemic control for   <7.0% adults with diabetes   08/20/2019 04:10 AM 5.6 4.8 - 5.6 % Final    Comment:    (NOTE) Pre diabetes:          5.7%-6.4% Diabetes:              >6.4% Glycemic control for   <7.0% adults with diabetes     CBG: Recent Labs  Lab 05/09/23 1510  GLUCAP 147*    Review of Systems:   Bolds are positive  Constitutional: weight loss, gain, night sweats, Fevers, chills, fatigue .  HEENT: headaches, Sore throat, sneezing, nasal congestion, post nasal drip, Difficulty swallowing, Tooth/dental problems, visual complaints visual changes, ear ache CV:  chest pain, radiates:,Orthopnea, PND, swelling in lower extremities, dizziness, palpitations, syncope.  GI  heartburn, indigestion, abdominal pain, nausea,  vomiting, diarrhea, change in bowel habits, loss of appetite, bloody stools.  Resp: cough, productive: , hemoptysis, dyspnea, chest pain, pleuritic.  Skin: rash or itching or icterus GU: dysuria, change in color of urine, urgency or frequency. flank pain, hematuria  MS: joint pain or swelling. decreased range of motion  Psych: change in mood or affect. depression or anxiety.  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   Surgical History:   Past Surgical History:  Procedure Laterality Date   A/V FISTULAGRAM Right 01/31/2023   Procedure: A/V Fistulagram;  Surgeon: Cephus Shelling, MD;  Location: [facility] INVASIVE CV LAB;  Service: Cardiovascular;  Laterality: Right;   AV FISTULA PLACEMENT Right 08/14/2022   Procedure: RIGHT ARM BRACHIOBASILIC ATERIOVENOUS FISTULA CREATION;  Surgeon: Leonie Douglas, MD;  Location: [facility] OR;  Service: Vascular;  Laterality: Right;   BASCILIC VEIN TRANSPOSITION Right 11/13/2022   Procedure: RIGHT ARM SECOND STAGE BASILIC VEIN TRANSPOSITION;  Surgeon: Leonie Douglas, MD;  Location: [facility] OR;  Service: Vascular;  Laterality: Right;   BIOPSY  01/01/2019   Procedure: BIOPSY;  Surgeon: Beverley Fiedler, MD;  Location: [facility] ENDOSCOPY;  Service: Endoscopy;;   BIOPSY  11/17/2021   Procedure: BIOPSY;  Surgeon: Lynann Bologna, MD;  Location: [facility] ENDOSCOPY;  Service: Gastroenterology;;   CESAREAN SECTION  1989   COLONOSCOPY  11/07/2020   2018   CYSTOSCOPY W/ URETERAL STENT PLACEMENT Left  08/01/2018   Procedure: CYSTOSCOPY WITH RETROGRADE PYELOGRAM/URETERAL STENT PLACEMENT;  Surgeon: Crista Elliot, MD;  Location: [facility] ORS;  Service: Urology;  Laterality: Left;   CYSTOSCOPY WITH RETROGRADE PYELOGRAM, URETEROSCOPY AND STENT PLACEMENT Left 01/15/2019   Procedure: CYSTOSCOPY WITH RETROGRADE PYELOGRAM, URETEROSCOPY AND STENT PLACEMENT;  Surgeon: Crista Elliot, MD;  Location: [facility] ORS;  Service: Urology;  Laterality: Left;   ENTEROSCOPY N/A 08/16/2022   Procedure: ENTEROSCOPY;  Surgeon: Meridee Score Netty Starring., MD;  Location: [facility] ENDOSCOPY;  Service: Gastroenterology;  Laterality: N/A;   ENTEROSCOPY N/A 10/31/2022   Procedure: ENTEROSCOPY;  Surgeon: Beverley Fiedler, MD;  Location: [facility] ENDOSCOPY;  Service: Gastroenterology;  Laterality: N/A;   ENTEROSCOPY N/A 05/09/2023   Procedure: ENTEROSCOPY;  Surgeon: [name], MD;  Location: [facility] ENDOSCOPY;  Service: Gastroenterology;  Laterality: N/A;   ESOPHAGOGASTRODUODENOSCOPY (EGD) WITH PROPOFOL N/A 01/01/2019   Procedure: ESOPHAGOGASTRODUODENOSCOPY (EGD) WITH PROPOFOL;  Surgeon: Beverley Fiedler, MD;  Location: [facility] ENDOSCOPY;  Service: Endoscopy;  Laterality: N/A;   ESOPHAGOGASTRODUODENOSCOPY (EGD) WITH PROPOFOL N/A 08/21/2019   Procedure: ESOPHAGOGASTRODUODENOSCOPY (EGD) WITH PROPOFOL;  Surgeon: Napoleon Form, MD;  Location: [facility] ENDOSCOPY;  Service: Endoscopy;  Laterality: N/A;   ESOPHAGOGASTRODUODENOSCOPY (EGD) WITH PROPOFOL N/A 11/17/2021   Procedure: ESOPHAGOGASTRODUODENOSCOPY (EGD) WITH PROPOFOL;  Surgeon: Lynann Bologna, MD;  Location: [facility] ENDOSCOPY;  Service: Gastroenterology;  Laterality: N/A;   FRACTURE SURGERY Left 2011   arm   HEMOSTASIS CLIP PLACEMENT  08/16/2022   Procedure: HEMOSTASIS CLIP PLACEMENT;  Surgeon: Lemar Lofty., MD;  Location: [facility] ENDOSCOPY;  Service: Gastroenterology;;   HOT HEMOSTASIS N/A 08/16/2022   Procedure: HOT HEMOSTASIS (ARGON PLASMA COAGULATION/BICAP);  Surgeon: Lemar Lofty., MD;   Location[facility] ENDOSCOPY;  Service: Gastroenterology;  Laterality: N/A;   HOT HEMOSTASIS N/A 10/31/2022   Procedure: HOT HEMOSTASIS (ARGON PLASMA COAGULATION/BICAP);  Surgeon: Beverley Fiedler, MD;  Location: [facility] ENDOSCOPY;  Service: Gastroenterology;  Laterality: N/A;   HOT HEMOSTASIS N/A 05/09/2023   Procedure: HOT HEMOSTASIS (ARGON PLASMA COAGULATION/BICAP);  Surgeon: [name], MD;  Location: [facility] ENDOSCOPY;  Service: Gastroenterology;  Laterality: N/A;   INSERTION OF DIALYSIS CATHETER Right 08/14/2022   Procedure: INSERTION OF TUNNELED DIALYSIS CATHETER USING PALINDROME CATHETER KIT 19CM;  Surgeon: Leonie Douglas, MD;  Location: [facility] OR;  Service: Vascular;  Laterality: Right;   POLYPECTOMY  08/16/2022   Procedure: POLYPECTOMY;  Surgeon: Lemar Lofty., MD;  Location: [facility] ENDOSCOPY;  Service: Gastroenterology;;   SUBMUCOSAL TATTOO INJECTION  08/16/2022   Procedure: SUBMUCOSAL TATTOO INJECTION;  Surgeon: Lemar Lofty., MD;  Location: [facility] ENDOSCOPY;  Service: Gastroenterology;;   UPPER GASTROINTESTINAL ENDOSCOPY       Social History:   reports that she has been smoking cigarettes. She has been exposed to tobacco smoke. She has never used smokeless tobacco. She reports that she does not currently use alcohol. She reports current drug use. Drug: Marijuana.   Family History:  Her family history includes Diabetes in her father. There is no history of Colon cancer, Esophageal cancer, Rectal cancer, or Stomach cancer.   Allergies Allergies  Allergen Reactions   Zestril [Lisinopril] Other (See Comments)    Hyperkalemia     Home Medications  Prior to Admission medications   Medication Sig Start Date End Date Taking? Authorizing Provider  albuterol (VENTOLIN HFA) 108 (90 Base) MCG/ACT inhaler Inhale 2 puffs into the lungs every 6 (six) hours as needed for wheezing or shortness of breath. Patient taking differently: Inhale 3 puffs into the lungs every 6 (six) hours as needed for  wheezing or shortness of breath. 02/01/23   Ghimire, Werner Lean, MD  amLODipine (NORVASC) 10 MG tablet Take 1 tablet (10 mg total) by mouth daily. 05/13/23   Elgergawy, Leana Roe, MD  atorvastatin (LIPITOR) 10 MG tablet Take 1 tablet (10 mg total) by mouth daily. 05/13/23   Elgergawy, Leana Roe,  MD  busPIRone (BUSPAR) 10 MG tablet Take 1 tablet (10 mg total) by mouth 2 (two) times daily. 02/10/23   Anders Simmonds, PA-C  ferrous sulfate 325 (65 FE) MG tablet Take 1 tablet (325 mg total) by mouth daily with breakfast. 02/01/23   Ghimire, Werner Lean, MD  Fluticasone-Umeclidin-Vilant (TRELEGY ELLIPTA) 100-62.5-25 MCG/ACT AEPB Inhale 1 puff into the lungs daily. 02/01/23   Ghimire, Werner Lean, MD  gabapentin (NEURONTIN) 100 MG capsule Take 1 capsule (100 mg total) by mouth 3 (three) times daily. 02/01/23   Ghimire, Werner Lean, MD  guaiFENesin (MUCINEX) 600 MG 12 hr tablet Take 1 tablet (600 mg total) by mouth 2 (two) times daily for 10 days. 05/13/23 05/23/23  Elgergawy, Leana Roe, MD  HYDROcodone-acetaminophen (NORCO/VICODIN) 5-325 MG tablet Take 1 tablet by mouth every 6 (six) hours as needed for severe pain (pain score 7-10). 05/13/23   Elgergawy, Leana Roe, MD  hydrOXYzine (ATARAX) 25 MG tablet Take 1 tablet (25 mg total) by mouth 3 (three) times daily as needed for anxiety or itching. 04/23/23   [name], MD  ipratropium-albuterol (DUONEB) 0.5-2.5 (3) MG/3ML SOLN Take 3 mLs by nebulization every 6 (six) hours as needed (shortness of breath and wheezing). 05/01/23   [name], MD  methylPREDNISolone (MEDROL DOSEPAK) 4 MG TBPK tablet follow package directions 05/13/23   Elgergawy, Leana Roe, MD  multivitamin (RENA-VIT) TABS tablet Take 1 tablet by mouth at bedtime. 02/01/23   Ghimire, Werner Lean, MD  nicotine (NICODERM CQ - DOSED IN MG/24 HOURS) 21 mg/24hr patch Place 1 patch (21 mg total) onto the skin daily. 05/13/23   Elgergawy, Leana Roe, MD  pantoprazole (PROTONIX) 40 MG tablet Take 1 tablet (40 mg  total) by mouth 2 (two) times daily. 05/13/23 08/11/23  Elgergawy, Leana Roe, MD  sodium bicarbonate 650 MG tablet Take 2 tablets (1,300 mg total) by mouth 2 (two) times daily. 05/13/23   Elgergawy, Leana Roe, MD  torsemide (DEMADEX) 100 MG tablet Take 1 tablet (100 mg total) by mouth daily. 05/13/23   Elgergawy, Leana Roe, MD  traZODone (DESYREL) 50 MG tablet Take 1 tablet (50 mg total) by mouth at bedtime as needed for sleep. 05/13/23   Elgergawy, Leana Roe, MD     Critical care time: 42 minutes      Joneen Roach, AGACNP-BC [facility]  See Amion for personal pager PCCM on call pager [phone] until 7pm. Please call Elink 7p-7a. [phone]  05/16/2023 11:24 AM

## 2023-05-16 NOTE — ED Notes (Signed)
 Intensivist at the bedside

## 2023-05-16 NOTE — ED Triage Notes (Addendum)
 Presents via EMS for SOB since 2am, body aches, chills,  EMS exam: 79% RA, improved to 80s on [place], 94% on NRB, Rhonchi, Abd distended, CBG 105, 170/90, 102bpm  COPD , asthma, CKD (has not been to HD since December)  In room, pt desats to 79% when briefly off NRB, increased work of breathing  No meds reported en route

## 2023-05-17 DIAGNOSIS — N184 Chronic kidney disease, stage 4 (severe): Secondary | ICD-10-CM | POA: Diagnosis not present

## 2023-05-17 DIAGNOSIS — U071 COVID-19: Secondary | ICD-10-CM | POA: Diagnosis not present

## 2023-05-17 DIAGNOSIS — J9601 Acute respiratory failure with hypoxia: Secondary | ICD-10-CM | POA: Diagnosis not present

## 2023-05-17 LAB — CBC
HCT: 19.4 % — ABNORMAL LOW (ref 36.0–46.0)
Hemoglobin: 6.8 g/dL — CL (ref 12.0–15.0)
MCH: 31.3 pg (ref 26.0–34.0)
MCHC: 35.1 g/dL (ref 30.0–36.0)
MCV: 89.4 fL (ref 80.0–100.0)
Platelets: 222 10*3/uL (ref 150–400)
RBC: 2.17 MIL/uL — ABNORMAL LOW (ref 3.87–5.11)
RDW: 19.4 % — ABNORMAL HIGH (ref 11.5–15.5)
WBC: 7 10*3/uL (ref 4.0–10.5)
nRBC: 0 % (ref 0.0–0.2)

## 2023-05-17 LAB — BASIC METABOLIC PANEL
Anion gap: 13 (ref 5–15)
BUN: 63 mg/dL — ABNORMAL HIGH (ref 8–23)
CO2: 21 mmol/L — ABNORMAL LOW (ref 22–32)
Calcium: 6.8 mg/dL — ABNORMAL LOW (ref 8.9–10.3)
Chloride: 96 mmol/L — ABNORMAL LOW (ref 98–111)
Creatinine, Ser: 3.82 mg/dL — ABNORMAL HIGH (ref 0.44–1.00)
GFR, Estimated: 13 mL/min — ABNORMAL LOW (ref >60)
Glucose, Bld: 90 mg/dL (ref 70–99)
Potassium: 3.2 mmol/L — ABNORMAL LOW (ref 3.5–5.1)
Sodium: 130 mmol/L — ABNORMAL LOW (ref 135–145)

## 2023-05-17 LAB — HEPATITIS B SURFACE ANTIBODY, QUANTITATIVE: Hep B S AB Quant (Post): 34.8 m[IU]/mL

## 2023-05-17 LAB — HEMOGLOBIN AND HEMATOCRIT, BLOOD
HCT: 20 % — ABNORMAL LOW (ref 36.0–46.0)
HCT: 23.5 % — ABNORMAL LOW (ref 36.0–46.0)
HCT: 23.6 % — ABNORMAL LOW (ref 36.0–46.0)
Hemoglobin: 6.7 g/dL — CL (ref 12.0–15.0)
Hemoglobin: 8.2 g/dL — ABNORMAL LOW (ref 12.0–15.0)
Hemoglobin: 8.1 g/dL — ABNORMAL LOW (ref 12.0–15.0)

## 2023-05-17 LAB — GLUCOSE, CAPILLARY
Glucose-Capillary: 87 mg/dL (ref 70–99)
Glucose-Capillary: 69 mg/dL — ABNORMAL LOW (ref 70–99)
Glucose-Capillary: 98 mg/dL (ref 70–99)
Glucose-Capillary: 124 mg/dL — ABNORMAL HIGH (ref 70–99)
Glucose-Capillary: 154 mg/dL — ABNORMAL HIGH (ref 70–99)
Glucose-Capillary: 91 mg/dL (ref 70–99)

## 2023-05-17 LAB — PREPARE RBC (CROSSMATCH)

## 2023-05-17 LAB — PHOSPHORUS: Phosphorus: 6.3 mg/dL — ABNORMAL HIGH (ref 2.5–4.6)

## 2023-05-17 LAB — MAGNESIUM: Magnesium: 1.9 mg/dL (ref 1.7–2.4)

## 2023-05-17 MED ORDER — HYDROXYZINE HCL 25 MG PO TABS
25.0000 mg | ORAL_TABLET | Freq: Three times a day (TID) | ORAL | Status: DC | PRN
  Administered 2023-05-17 – 2023-05-18 (×4): 25 mg via ORAL
  Filled 2023-05-17 (×4): qty 1

## 2023-05-17 MED ORDER — ORAL CARE MOUTH RINSE: 15.0000 mL | OROMUCOSAL | Status: DC | PRN

## 2023-05-17 MED ORDER — DARBEPOETIN ALFA 150 MCG/0.3ML IJ SOSY
150.0000 ug | PREFILLED_SYRINGE | Freq: Once | INTRAMUSCULAR | Status: AC
Start: 2023-05-17 — End: 2023-05-17
  Administered 2023-05-17: 150 ug via SUBCUTANEOUS
  Filled 2023-05-17: qty 0.3

## 2023-05-17 MED ORDER — CHLORHEXIDINE GLUCONATE CLOTH 2 % EX PADS
6.0000 | MEDICATED_PAD | Freq: Every day | CUTANEOUS | Status: DC
  Administered 2023-05-17 – 2023-05-18 (×2): 6 via TOPICAL

## 2023-05-17 MED ORDER — GABAPENTIN 100 MG PO CAPS
100.0000 mg | ORAL_CAPSULE | Freq: Once | ORAL | Status: AC
  Administered 2023-05-17: 100 mg via ORAL
  Filled 2023-05-17: qty 1

## 2023-05-17 MED ORDER — SODIUM CHLORIDE 0.9% IV SOLUTION: Freq: Once | INTRAVENOUS | Status: DC

## 2023-05-17 MED ORDER — ALPRAZOLAM 0.5 MG PO TABS
0.2500 mg | ORAL_TABLET | Freq: Two times a day (BID) | ORAL | Status: DC | PRN
  Administered 2023-05-17 – 2023-05-18 (×3): 0.25 mg via ORAL
  Filled 2023-05-17 (×3): qty 1

## 2023-05-17 NOTE — Progress Notes (Signed)
 [facility] Progress Note  Name: [name] MRN: [id number] DOB: [birth year]  Chief Complaint:  Shortness of breath   Subjective:  Last HD on 2/28 with 2.5 kg UF.  No urine output is documented.  She states that she wants one more treatment of dialysis and then thinks she just wants to be kept comfortable off of dialysis, knowing that this will lead to her death.  She wishes she could talk with her daughter about this but she has called and gotten her voicemail - she thinks her daughter is mad at her.  She states "I have no one" and "I don't want to live like this anymore" and "I don't want to be on a machine for the rest of my life".  I tried to call her daughter but was not able to reach her.   Review of systems:  She is short of breath - this is better with HD but still rough Chest tightness She has frequent cough  She has nausea especially after coughing   ------------- Background on consult:  Patient with ESRD, alcohol use, polysubstance abuse, hypertension, COPD, and homelessness who presented to the hospital with shortness of breath.  She states that she could not breathe "all of a sudden".  She is unable to provide much history.  She was found to be covid positive.  She has been off of dialysis for several months due to noncompliance and was recently reclassified as CKD stage V due to the same.  She has been on high flow nasal cannula.  Her CXR demonstrated increased lung base opacities and small effusions.        Intake/Output Summary (Last 24 hours) at 05/17/2023 0642 Last data filed at 05/17/2023 0110 Gross per 24 hour  Intake 263.53 ml  Output 2500 ml  Net -2236.47 ml    Vitals:  Vitals:   05/17/23 0254 05/17/23 0300 05/17/23 0400 05/17/23 0509  BP:  (!) 124/58 127/61   Pulse: 65 66 68 69  Resp: 13 12 12 15   Temp:   98.3 F (36.8 C)   TempSrc:   Oral   SpO2: 100% 98% 98% 98%  Weight:      Height:         Physical Exam:  General adult female in  bed on heated high flow oxygen, short of breath and appears chronically ill and older than stated age HEENT normocephalic atraumatic extraocular movements intact sclera anicteric Neck supple trachea midline Lungs decreased breath sounds and increased work of breathing with exertion on heated high flow oxygen  Heart S1S2  Abdomen soft nontender nondistended Extremities 1+ edema  Psych appropriate mood and affect Neuro awake and conversant with limitation of respiratory status, follows commands   Medications reviewed   Labs:     Latest Ref Rng & Units 05/17/2023    3:18 AM 05/16/2023   10:32 AM 05/16/2023    9:07 AM  BMP  Glucose 70 - 99 mg/dL 90   96   BUN 8 - 23 mg/dL 63   409   Creatinine 8.11 - 1.00 mg/dL 9.14   7.82   Sodium 956 - 145 mmol/L 130  129  131   Potassium 3.5 - 5.1 mmol/L 3.2  3.7  3.9   Chloride 98 - 111 mmol/L 96   96   CO2 22 - 32 mmol/L 21   16   Calcium 8.9 - 10.3 mg/dL 6.8   6.9      Assessment/Plan:   #  ESRD - Please continued goals of care discussions per primary team  - HD today using AVF.  She states that she wants one more treatment of dialysis and then thinks she just wants to be kept comfortable off of dialysis, knowing that this will lead to her death.  She does want to talk to her daughter before making a final decision  - We have discussed that I do believe she has end-stage renal disease and would otherwise continue to need hemodialysis in order to stay alive.   - Note that she has likely lost some of her limited reserve - Would consult palliative care for continued assistance.     # Acute hypoxic respiratory failure  - Secondary to COVID-19 pneumonia as well as fluid overload in the setting of dialysis noncompliance - Optimize volume status with HD   # Covid 19 PNA - optimize volume status with HD  - Therapies per primary team - on solumedrol    # Metabolic acidosis - s/p HD on arrival  - assess needs and goals/willingness for HD daily     # HTN  - Optimize volume status with HD    # Anemia of CKD - aranesp once today - PRBC's once now    Disposition - continue inpatient monitoring.  Note goals of care are in flux.  I have reached out to the primary team and have tried to call the patient's daughter.  I left her daughter a voicemail message with the number for the ICU to please call her mother urgently   Estanislado Emms, MD 05/17/2023 7:31 AM

## 2023-05-17 NOTE — Plan of Care (Signed)
  Problem: Education: Goal: Knowledge of risk factors and measures for prevention of condition will improve Outcome: Not Progressing   Problem: Coping: Goal: Psychosocial and spiritual needs will be supported Outcome: Not Progressing   Problem: Respiratory: Goal: Will maintain a patent airway Outcome: Not Progressing

## 2023-05-17 NOTE — Progress Notes (Signed)
 [name], MRN:  [id number], DOB:  Mar 04, 1961, LOS: 1 ADMISSION DATE:  05/16/2023, CONSULTATION DATE:  2/28 REFERRING MD:  Dr. 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  She was recently admitted from 2/20 to 2/25 with a primary diagnosis of acute blood loss anemia secondary to upper GI bleed in the setting of multiple angiectasia's in the stomach duodenum and jejunum.  She did receive a total of 3 unit packed red cells for her anemia.  Underwent upper endoscopy with APC treatment.  Also during that admission she was treated for COPD exacerbation.  She was also found to have a metabolic acidosis and hypervolemia secondary to his chronic kidney disease for which she was treated with diuresis as she does not not want to pursue any further dialysis treatment.  She was discharged on 12/25, however, she returned to [facility] emergency department on 2/28 with complaints of worsening abdominal and lower extremity swelling as well as shortness of breath.  Denies cough but complained of fever and chills.  Upon EMS arrival oxygen saturations were in the 70s and improved to the 90s on a nonrebreather.  Workup in the emergency department included a chest x-ray showing patchy bilateral infiltrates concerning for pulmonary edema versus infection.  She did test positive for COVID-19.  Rocephin was administered in the emergency department.  Given complexity of medical issues and high oxygen requirement PCCM was asked to evaluate for admission.  Of note patient is a smoker, reports quitting alcohol 1 year ago, but still does use cocaine last use was 2/27 the day before presentation.  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   Significant Hospital Events: Including procedures, antibiotic start and stop dates in addition to other pertinent events   2/20-2/25 admitted for GI bleeding.  2/28 admitted for respiratory failure  Interim History / Subjective:  Received HD 2.5L UF, weaning O2, per nephro she wants 1 more HD session and comfort if can communicate and let daughter know  Objective   Blood pressure (!) 150/65, pulse 83, temperature 99 F (37.2 C), temperature source Oral, resp. rate 15, height 5\' 7"  (1.702 m), weight 59.9 kg, SpO2 100%.    FiO2 (%):  [65 %-100 %] 65 %   Intake/Output Summary (Last 24 hours) at 05/17/2023 0825 Last data filed at 05/17/2023 0600 Gross per 24 hour  Intake 607.85 ml  Output 2750 ml  Net -2142.15 ml   Filed Weights   05/16/23 0945 05/16/23 2115 05/17/23 0114  Weight: 56 kg 61.5 kg 59.9 kg    Examination: General: adult female in mild distress HENT: /AT, PERRL, no JVD Lungs: NWOB on HHFNC Cardiovascular: Tachy, regular, no MRG Abdomen: Soft, NT, ND. Diffuse anasarca Extremities:No acute deformity. Trace edema.  Neuro: Alert, oriented,  Resolved Hospital Problem list     Assessment & Plan:   Acute respiratory failure withy hypoxia: satting 70s on room air. Multifactorial. COVID, volume overload. Cannot rule  out bacterial PNA. Acidosis also likely driving work of breathing some.  COVID 19 pneumonia Pulmonary edema - Wean HHFNC, goal O2 90% - Steroids for covid - Triple neb therapy - HD per nephro, improved with UF removal - Stop vancomycin (MRSA Swab negative)  and zosyn (imaging most consistent with fluid overload)  CKD IV AG acidosis secondary to renal failure:  - HD per nephro, GOC, she reports desire for comfort if can communicate this with daughter  Cocaine abuse:  - does not seem willing to quit  Upper GIB: admitted 2/20 > 2/25 - continue PPI  Best Practice (right click and "Reselect all SmartList Selections" daily)   Diet/type: Regular consistency (see orders) DVT prophylaxis prophylactic heparin  Pressure ulcer(s): present on admission sacral GI prophylaxis: PPI Lines: N/A Foley:  N/A Code Status:  full code Last date of multidisciplinary goals of care discussion [Ongoing GOC she seems to vacillate between comfort care and full code, communication with daughter seems key - she can not reach her via phone]  Labs   CBC: Recent Labs  Lab 05/11/23 0539 05/12/23 0853 05/16/23 0907 05/16/23 1032 05/17/23 0318 05/17/23 0455  WBC 4.8 5.6 8.1  --  7.0  --   NEUTROABS  --   --  6.6  --   --   --   HGB 8.7* 9.2* 7.5* 9.2* 6.8* 6.7*  HCT 25.6* 26.6* 23.2* 27.0* 19.4* 20.0*  MCV 88.6 89.3 94.7  --  89.4  --   PLT 206 234 256  --  222  --     Basic Metabolic Panel: Recent Labs  Lab 05/11/23 0539 05/12/23 0853 05/13/23 0439 05/16/23 0907 05/16/23 1032 05/17/23 0318  NA 136 136 137 131* 129* 130*  K 4.2 4.1 4.0 3.9 3.7 3.2*  CL 107 106 102 96*  --  96*  CO2 13* 15* 19* 16*  --  21*  GLUCOSE 102* 104* 89 96  --  90  BUN 75* 86* 98* 107*  --  63*  CREATININE 5.00* 5.37* 5.34* 6.03*  --  3.82*  CALCIUM 8.6* 7.8* 7.3* 6.9*  --  6.8*  MG  --   --   --   --   --  1.9  PHOS  --   --   --   --   --  6.3*   GFR: Estimated Creatinine Clearance: 14.4 mL/min (A) (by C-G formula based on SCr of 3.82 mg/dL (H)). Recent Labs  Lab 05/11/23 0539 05/12/23 0853 05/16/23 0907 05/16/23 0916 05/16/23 1038 05/17/23 0318  WBC 4.8 5.6 8.1  --   --  7.0  LATICACIDVEN  --   --   --  1.0 0.7  --     Liver Function Tests: Recent Labs   Lab 05/16/23 0907  AST 41  ALT 81*  ALKPHOS 96  BILITOT 0.9  PROT 6.7  ALBUMIN 3.2*   No results for input(s): "LIPASE", "AMYLASE" in the last 168 hours. No results for input(s): "AMMONIA" in the last 168 hours.  ABG    Component Value Date/Time   PHART 7.310 (L) 10/25/2022 1354   PCO2ART 15.2 (LL) 10/25/2022 1354   PO2ART 193 (H) 10/25/2022 1354   HCO3 16.6 (L) 05/16/2023 1032   TCO2 17 (L) 05/16/2023 1032   ACIDBASEDEF 7.0 (H) 05/16/2023 1032   O2SAT 84 05/16/2023 1032     Coagulation Profile: Recent Labs  Lab 05/16/23 0920 05/16/23 1550  INR 1.0 1.1    Cardiac Enzymes: No results for  input(s): "CKTOTAL", "CKMB", "CKMBINDEX", "TROPONINI" in the last 168 hours.  HbA1C: Hgb A1c MFr Bld  Date/Time Value Ref Range Status  05/16/2023 12:20 PM 5.0 4.8 - 5.6 % Final    Comment:    (NOTE) Pre diabetes:          5.7%-6.4%  Diabetes:              >6.4%  Glycemic control for   <7.0% adults with diabetes   10/25/2022 08:53 PM 4.6 (L) 4.8 - 5.6 % Final    Comment:    (NOTE) Pre diabetes:          5.7%-6.4%  Diabetes:              >6.4%  Glycemic control for   <7.0% adults with diabetes     CBG: Recent Labs  Lab 05/16/23 1506 05/16/23 1915 05/16/23 2304 05/17/23 0428 05/17/23 0736  GLUCAP 117* 139* 115* 87 69*    Review of Systems:   N/a  Past Medical History:  She,  has a past medical history of Alcohol abuse, Allergy, Anxiety, Arthritis, Asthma, Cannabis abuse, Cocaine abuse (HCC), COPD (chronic obstructive pulmonary disease) (HCC), Depression, Drug addiction (HCC), GERD (gastroesophageal reflux disease), Heart murmur, Homelessness, Hyperlipidemia, Hypertension, PFO (patent foramen ovale), Seasonal allergies, and 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   Surgical History:   Past Surgical History:  Procedure Laterality Date   A/V FISTULAGRAM Right 01/31/2023   Procedure: A/V Fistulagram;   Surgeon: Cephus Shelling, MD;  Location[facility] INVASIVE CV LAB;  Service: Cardiovascular;  Laterality: Right;   AV FISTULA PLACEMENT Right 08/14/2022   Procedure: RIGHT ARM BRACHIOBASILIC ATERIOVENOUS FISTULA CREATION;  Surgeon: Leonie Douglas, MD;  Location: [facility] OR;  Service: Vascular;  Laterality: Right;   BASCILIC VEIN TRANSPOSITION Right 11/13/2022   Procedure: RIGHT ARM SECOND STAGE BASILIC VEIN TRANSPOSITION;  Surgeon: Leonie Douglas, MD;  Location: [facility] OR;  Service: Vascular;  Laterality: Right;   BIOPSY  01/01/2019   Procedure: BIOPSY;  Surgeon: Beverley Fiedler, MD;  Location: [facility] ENDOSCOPY;  Service: Endoscopy;;   BIOPSY  11/17/2021   Procedure: BIOPSY;  Surgeon: Lynann Bologna, MD;  Location: [facility] ENDOSCOPY;  Service: Gastroenterology;;   CESAREAN SECTION  1989   COLONOSCOPY  11/07/2020   2018   CYSTOSCOPY W/ URETERAL STENT PLACEMENT Left 08/01/2018   Procedure: CYSTOSCOPY WITH RETROGRADE PYELOGRAM/URETERAL STENT PLACEMENT;  Surgeon: Crista Elliot, MD;  Location: [facility] ORS;  Service: Urology;  Laterality: Left;   CYSTOSCOPY WITH RETROGRADE PYELOGRAM, URETEROSCOPY AND STENT PLACEMENT Left 01/15/2019   Procedure: CYSTOSCOPY WITH RETROGRADE PYELOGRAM, URETEROSCOPY AND STENT PLACEMENT;  Surgeon: Crista Elliot, MD;  Location: [facility] ORS;  Service: Urology;  Laterality: Left;   ENTEROSCOPY N/A 08/16/2022   Procedure: ENTEROSCOPY;  Surgeon: Meridee Score Netty Starring., MD;  Location: [facility] ENDOSCOPY;  Service: Gastroenterology;  Laterality: N/A;   ENTEROSCOPY N/A 10/31/2022   Procedure: ENTEROSCOPY;  Surgeon: Beverley Fiedler, MD;  Location: [facility] ENDOSCOPY;  Service: Gastroenterology;  Laterality: N/A;   ENTEROSCOPY N/A 05/09/2023   Procedure: ENTEROSCOPY;  Surgeon: [name], MD;  Location: [facility] ENDOSCOPY;  Service: Gastroenterology;  Laterality: N/A;   ESOPHAGOGASTRODUODENOSCOPY (EGD) WITH PROPOFOL N/A 01/01/2019   Procedure: ESOPHAGOGASTRODUODENOSCOPY (EGD) WITH PROPOFOL;  Surgeon: Beverley Fiedler, MD;   Location: [facility] ENDOSCOPY;  Service: Endoscopy;  Laterality: N/A;   ESOPHAGOGASTRODUODENOSCOPY (EGD) WITH PROPOFOL N/A 08/21/2019   Procedure: ESOPHAGOGASTRODUODENOSCOPY (EGD) WITH PROPOFOL;  Surgeon: Marsa Aris  V, MD;  Location: [facility] ENDOSCOPY;  Service: Endoscopy;  Laterality: N/A;   ESOPHAGOGASTRODUODENOSCOPY (EGD) WITH PROPOFOL N/A 11/17/2021   Procedure: ESOPHAGOGASTRODUODENOSCOPY (EGD) WITH PROPOFOL;  Surgeon: Lynann Bologna, MD;  Location: [facility] ENDOSCOPY;  Service: Gastroenterology;  Laterality: N/A;   FRACTURE SURGERY Left 2011   arm   HEMOSTASIS CLIP PLACEMENT  08/16/2022   Procedure: HEMOSTASIS CLIP PLACEMENT;  Surgeon: Lemar Lofty., MD;  Location: [facility] ENDOSCOPY;  Service: Gastroenterology;;   HOT HEMOSTASIS N/A 08/16/2022   Procedure: HOT HEMOSTASIS (ARGON PLASMA COAGULATION/BICAP);  Surgeon: Lemar Lofty., MD;  Location: [facility] ENDOSCOPY;  Service: Gastroenterology;  Laterality: N/A;   HOT HEMOSTASIS N/A 10/31/2022   Procedure: HOT HEMOSTASIS (ARGON PLASMA COAGULATION/BICAP);  Surgeon: Beverley Fiedler, MD;  Location: [facility] ENDOSCOPY;  Service: Gastroenterology;  Laterality: N/A;   HOT HEMOSTASIS N/A 05/09/2023   Procedure: HOT HEMOSTASIS (ARGON PLASMA COAGULATION/BICAP);  Surgeon: [name], MD;  Location[facility] ENDOSCOPY;  Service: Gastroenterology;  Laterality: N/A;   INSERTION OF DIALYSIS CATHETER Right 08/14/2022   Procedure: INSERTION OF TUNNELED DIALYSIS CATHETER USING PALINDROME CATHETER KIT 19CM;  Surgeon: Leonie Douglas, MD;  Location: [facility] OR;  Service: Vascular;  Laterality: Right;   POLYPECTOMY  08/16/2022   Procedure: POLYPECTOMY;  Surgeon: Lemar Lofty., MD;  Location: [facility] ENDOSCOPY;  Service: Gastroenterology;;   SUBMUCOSAL TATTOO INJECTION  08/16/2022   Procedure: SUBMUCOSAL TATTOO INJECTION;  Surgeon: Lemar Lofty., MD;  Location: [facility] ENDOSCOPY;  Service: Gastroenterology;;   UPPER GASTROINTESTINAL ENDOSCOPY       Social History:   reports  that she has been smoking cigarettes. She has been exposed to tobacco smoke. She has never used smokeless tobacco. She reports that she does not currently use alcohol. She reports current drug use. Drug: Marijuana.   Family History:  Her family history includes Diabetes in her father. There is no history of Colon cancer, Esophageal cancer, Rectal cancer, or Stomach cancer.   Allergies Allergies  Allergen Reactions   Zestril [Lisinopril] Other (See Comments)    Hyperkalemia     Home Medications  Prior to Admission medications   Medication Sig Start Date End Date Taking? Authorizing Provider  albuterol (VENTOLIN HFA) 108 (90 Base) MCG/ACT inhaler Inhale 2 puffs into the lungs every 6 (six) hours as needed for wheezing or shortness of breath. Patient taking differently: Inhale 3 puffs into the lungs every 6 (six) hours as needed for wheezing or shortness of breath. 02/01/23   Ghimire, Werner Lean, MD  amLODipine (NORVASC) 10 MG tablet Take 1 tablet (10 mg total) by mouth daily. 05/13/23   Elgergawy, Leana Roe, MD  atorvastatin (LIPITOR) 10 MG tablet Take 1 tablet (10 mg total) by mouth daily. 05/13/23   Elgergawy, Leana Roe, MD  busPIRone (BUSPAR) 10 MG tablet Take 1 tablet (10 mg total) by mouth 2 (two) times daily. 02/10/23   Anders Simmonds, PA-C  ferrous sulfate 325 (65 FE) MG tablet Take 1 tablet (325 mg total) by mouth daily with breakfast. 02/01/23   Ghimire, Werner Lean, MD  Fluticasone-Umeclidin-Vilant (TRELEGY ELLIPTA) 100-62.5-25 MCG/ACT AEPB Inhale 1 puff into the lungs daily. 02/01/23   Ghimire, Werner Lean, MD  gabapentin (NEURONTIN) 100 MG capsule Take 1 capsule (100 mg total) by mouth 3 (three) times daily. 02/01/23   Ghimire, Werner Lean, MD  guaiFENesin (MUCINEX) 600 MG 12 hr tablet Take 1 tablet (600 mg total) by mouth 2 (two) times daily for 10 days. 05/13/23 05/23/23  Elgergawy, Leana Roe, MD  HYDROcodone-acetaminophen (NORCO/VICODIN)  5-325 MG tablet Take 1 tablet by mouth every 6 (six)  hours as needed for severe pain (pain score 7-10). 05/13/23   Elgergawy, Leana Roe, MD  hydrOXYzine (ATARAX) 25 MG tablet Take 1 tablet (25 mg total) by mouth 3 (three) times daily as needed for anxiety or itching. 04/23/23   [name], MD  ipratropium-albuterol (DUONEB) 0.5-2.5 (3) MG/3ML SOLN Take 3 mLs by nebulization every 6 (six) hours as needed (shortness of breath and wheezing). 05/01/23   [name], MD  methylPREDNISolone (MEDROL DOSEPAK) 4 MG TBPK tablet follow package directions 05/13/23   Elgergawy, Leana Roe, MD  multivitamin (RENA-VIT) TABS tablet Take 1 tablet by mouth at bedtime. 02/01/23   Ghimire, Werner Lean, MD  nicotine (NICODERM CQ - DOSED IN MG/24 HOURS) 21 mg/24hr patch Place 1 patch (21 mg total) onto the skin daily. 05/13/23   Elgergawy, Leana Roe, MD  pantoprazole (PROTONIX) 40 MG tablet Take 1 tablet (40 mg total) by mouth 2 (two) times daily. 05/13/23 08/11/23  Elgergawy, Leana Roe, MD  sodium bicarbonate 650 MG tablet Take 2 tablets (1,300 mg total) by mouth 2 (two) times daily. 05/13/23   Elgergawy, Leana Roe, MD  torsemide (DEMADEX) 100 MG tablet Take 1 tablet (100 mg total) by mouth daily. 05/13/23   Elgergawy, Leana Roe, MD  traZODone (DESYREL) 50 MG tablet Take 1 tablet (50 mg total) by mouth at bedtime as needed for sleep. 05/13/23   Elgergawy, Leana Roe, MD     Critical care time:    CRITICAL CARE Performed by: Karren Burly   Total critical care time: 31 minutes  Critical care time was exclusive of separately billable procedures and treating other patients.  Critical care was necessary to treat or prevent imminent or life-threatening deterioration.  Critical care was time spent personally by me on the following activities: development of treatment plan with patient and/or surrogate as well as nursing, discussions with consultants, evaluation of patient's response to treatment, examination of patient, obtaining history from patient or surrogate,  ordering and performing treatments and interventions, ordering and review of laboratory studies, ordering and review of radiographic studies, pulse oximetry and re-evaluation of patient's condition.   Karren Burly, MD [facility] See [facility] on call pager [phone] until 7pm. Please call Elink 7p-7a. [phone]  05/17/2023 8:25 AM

## 2023-05-17 NOTE — Progress Notes (Signed)
 eLink Physician-Brief Progress Note Patient Name: [name] DOB: 1960/04/21 MRN: [id number]   Date of Service  05/17/2023  HPI/Events of Note  Patient takes Gabapentin at home for cramping in legs and hands. Requesting it to be ordered here.   eICU Interventions  One time dose ordered      Intervention Category Intermediate Interventions: Pain - evaluation and management  Dutch Ing G Shayra Anton 05/17/2023, 9:07 PM  Addendum at 3:30 am - Despite completing HD , patient very hypertensive. Has had high Bps during the day as well PRN hydralazine ordered

## 2023-05-17 NOTE — Progress Notes (Signed)
 Received patient in bed  Alert and oriented.  Informed consent signed and in chart.   TX duration:2.5 hours  Patient tolerated well.   Alert, without acute distress.  Hand-off given to patient's nurse.   Access used: AVF Access issues: none  Total UF removed: Medication(s) given: none Post HD VS: see tbale below Post HD weight: 59.9kg   05/17/23 0110  Vitals  Temp 98.2 F (36.8 C)  Temp Source Oral  BP (!) 165/70  MAP (mmHg) 96  BP Location Left Arm  BP Method Automatic  Patient Position (if appropriate) Lying  Pulse Rate 77  Pulse Rate Source Monitor  ECG Heart Rate 78  Resp 17  Oxygen Therapy  SpO2 96 %  O2 Device HHFNC  Patient Activity (if Appropriate) In bed  Pulse Oximetry Type Continuous  During Treatment Monitoring  Blood Flow Rate (mL/min) 0 mL/min  Arterial Pressure (mmHg) 0 mmHg  Venous Pressure (mmHg) -0.61 mmHg  TMP (mmHg) -55.96 mmHg  Ultrafiltration Rate (mL/min) 1120 mL/min  Dialysate Flow Rate (mL/min) 0 ml/min  Dialysate Potassium Concentration 3  Dialysate Calcium Concentration 2.5  Duration of HD Treatment -hour(s) 2.5 hour(s)  Cumulative Fluid Removed (mL) per Treatment  2500.28  HD Safety Checks Performed Yes  Intra-Hemodialysis Comments Tolerated well  Post Treatment  Dialyzer Clearance Heavily streaked  Hemodialysis Intake (mL) 0 mL  Liters Processed 30  Fluid Removed (mL) 2500 mL  Tolerated HD Treatment Yes  Post-Hemodialysis Comments 20  AVG/AVF Arterial Site Held (minutes) 20 minutes  Fistula / Graft Right Upper arm Arteriovenous fistula  Placement Date/Time: 08/14/22 1230   Orientation: Right  Access Location: (c) Upper arm  Access Type: Arteriovenous fistula  Site Condition No complications  Fistula / Graft Assessment Present;Thrill;Bruit  Status Deaccessed;Flushed;Patent      [facility]

## 2023-05-18 DIAGNOSIS — J9601 Acute respiratory failure with hypoxia: Secondary | ICD-10-CM | POA: Diagnosis not present

## 2023-05-18 DIAGNOSIS — N186 End stage renal disease: Secondary | ICD-10-CM | POA: Diagnosis present

## 2023-05-18 DIAGNOSIS — U071 COVID-19: Secondary | ICD-10-CM | POA: Diagnosis not present

## 2023-05-18 DIAGNOSIS — N184 Chronic kidney disease, stage 4 (severe): Secondary | ICD-10-CM | POA: Diagnosis not present

## 2023-05-18 LAB — RENAL FUNCTION PANEL
Albumin: 2.3 g/dL — ABNORMAL LOW (ref 3.5–5.0)
Anion gap: 15 (ref 5–15)
BUN: 27 mg/dL — ABNORMAL HIGH (ref 8–23)
CO2: 24 mmol/L (ref 22–32)
Calcium: 7.7 mg/dL — ABNORMAL LOW (ref 8.9–10.3)
Chloride: 93 mmol/L — ABNORMAL LOW (ref 98–111)
Creatinine, Ser: 2.49 mg/dL — ABNORMAL HIGH (ref 0.44–1.00)
GFR, Estimated: 21 mL/min — ABNORMAL LOW (ref >60)
Glucose, Bld: 94 mg/dL (ref 70–99)
Phosphorus: 3.1 mg/dL (ref 2.5–4.6)
Potassium: 3.1 mmol/L — ABNORMAL LOW (ref 3.5–5.1)
Sodium: 132 mmol/L — ABNORMAL LOW (ref 135–145)

## 2023-05-18 LAB — HEMOGLOBIN AND HEMATOCRIT, BLOOD
HCT: 24.5 % — ABNORMAL LOW (ref 36.0–46.0)
HCT: 26.1 % — ABNORMAL LOW (ref 36.0–46.0)
Hemoglobin: 8.4 g/dL — ABNORMAL LOW (ref 12.0–15.0)
Hemoglobin: 8.9 g/dL — ABNORMAL LOW (ref 12.0–15.0)

## 2023-05-18 LAB — BPAM RBC
Blood Product Expiration Date: 202503252359
ISSUE DATE / TIME: 202503011017
Unit Type and Rh: 6200

## 2023-05-18 LAB — GLUCOSE, CAPILLARY
Glucose-Capillary: 82 mg/dL (ref 70–99)
Glucose-Capillary: 98 mg/dL (ref 70–99)
Glucose-Capillary: 111 mg/dL — ABNORMAL HIGH (ref 70–99)

## 2023-05-18 LAB — TYPE AND SCREEN
ABO/RH(D): A POS
Antibody Screen: NEGATIVE
Unit division: 0

## 2023-05-18 MED ORDER — ACETAMINOPHEN 325 MG PO TABS
650.0000 mg | ORAL_TABLET | Freq: Four times a day (QID) | ORAL | Status: DC | PRN
  Administered 2023-05-18: 650 mg via ORAL
  Filled 2023-05-18: qty 2

## 2023-05-18 MED ORDER — DARBEPOETIN ALFA 150 MCG/0.3ML IJ SOSY: 150.0000 ug | PREFILLED_SYRINGE | INTRAMUSCULAR | Status: DC

## 2023-05-18 MED ORDER — GUAIFENESIN 100 MG/5ML PO LIQD
5.0000 mL | ORAL | Status: DC | PRN
  Administered 2023-05-18: 5 mL via ORAL
  Filled 2023-05-18: qty 10

## 2023-05-18 MED ORDER — HYDRALAZINE HCL 20 MG/ML IJ SOLN
10.0000 mg | Freq: Four times a day (QID) | INTRAMUSCULAR | Status: DC | PRN
  Administered 2023-05-18: 10 mg via INTRAVENOUS
  Filled 2023-05-18: qty 1

## 2023-05-18 NOTE — Progress Notes (Signed)
 [name], MRN:  [id number], DOB:  [birth year], LOS: 2 ADMISSION DATE:  05/16/2023, CONSULTATION DATE:  2/28 REFERRING MD:  Dr. 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  She was recently admitted from 2/20 to 2/25 with a primary diagnosis of acute blood loss anemia secondary to upper GI bleed in the setting of multiple angiectasia's in the stomach duodenum and jejunum.  She did receive a total of 3 unit packed red cells for her anemia.  Underwent upper endoscopy with APC treatment.  Also during that admission she was treated for COPD exacerbation.  She was also found to have a metabolic acidosis and hypervolemia secondary to his chronic kidney disease for which she was treated with diuresis as she does not not want to pursue any further dialysis treatment.  She was discharged on 12/25, however, she returned to [facility] emergency department on 2/28 with complaints of worsening abdominal and lower extremity swelling as well as shortness of breath.  Denies cough but complained of fever and chills.  Upon EMS arrival oxygen saturations were in the 70s and improved to the 90s on a nonrebreather.  Workup in the emergency department included a chest x-ray showing patchy bilateral infiltrates concerning for pulmonary edema versus infection.  She did test positive for COVID-19.  Rocephin was administered in the emergency department.  Given complexity of medical issues and high oxygen requirement PCCM was asked to evaluate for admission.  Of note patient is a smoker, reports quitting alcohol 1 year ago, but still does use cocaine last use was 2/27 the day before presentation.  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   Significant Hospital Events: Including procedures, antibiotic start and stop dates in addition to other pertinent events   2/20-2/25 admitted for GI bleeding.  2/28 admitted for respiratory failure, HD  Interim History / Subjective:  HD again, 2.7L UF, Weaning O2, patient difficult to focus on conversation  Objective   Blood pressure (!) 160/64, pulse 81, temperature (!) 101.8 F (38.8 C), temperature source Oral, resp. rate (!) 22, height 5\' 7"  (1.702 m), weight 59.9 kg, SpO2 94%.    FiO2 (%):  [50 %] 50 %   Intake/Output Summary (Last 24 hours) at 05/18/2023 1021 Last data filed at 05/18/2023 0315 Gross per 24 hour  Intake 1260 ml  Output 3100 ml  Net -1840 ml   Filed Weights   05/16/23 0945 05/16/23 2115 05/17/23 0114  Weight: 56 kg 61.5 kg 59.9 kg    Examination: General: adult female in mild distress HENT: [place]/AT, PERRL, no JVD Lungs: NWOB on HHFNC Cardiovascular: Tachy, regular, no MRG Abdomen: Soft, NT, ND. Diffuse anasarca Extremities:No acute deformity. Trace edema.  Neuro: Alert, oriented,  Resolved Hospital Problem list     Assessment & Plan:   Acute respiratory failure withy hypoxia: satting 70s on room air. Multifactorial. COVID, volume overload. Cannot rule out bacterial PNA. Acidosis also likely driving work of  breathing some.  COVID 19 pneumonia Pulmonary edema - Wean HHFNC, goal O2 90% - Steroids for covid - Triple neb therapy - HD per nephro, improved with UF removal (2 sessions HD 4.9L off total) - Stop vancomycin (MRSA Swab negative) and zosyn (imaging  most consistent with fluid overload)  CKD IV AG acidosis secondary to renal failure:  - HD per nephro, GOC, she reports desire for comfort, wants to discuss with family in person  Cocaine abuse:  - does not seem willing to quit  Upper GIB: admitted 2/20 > 2/25 - continue PPI  Best Practice (right click and "Reselect all SmartList Selections" daily)   Diet/type: Regular consistency (see orders) DVT prophylaxis prophylactic heparin  Pressure ulcer(s): present on admission sacral GI prophylaxis: PPI Lines: N/A Foley:  N/A Code Status:  full code Last date of multidisciplinary goals of care discussion [Ongoing GOC she seems to vacillate between comfort care and full code, communication with daughter seems key - Daughter updated 3/1]  Labs   CBC: Recent Labs  Lab 05/12/23 0853 05/16/23 0907 05/16/23 1032 05/17/23 0318 05/17/23 0455 05/17/23 1520 05/17/23 2213 05/18/23 0747  WBC 5.6 8.1  --  7.0  --   --   --   --   NEUTROABS  --  6.6  --   --   --   --   --   --   HGB 9.2* 7.5*   < > 6.8* 6.7* 8.2* 8.1* 8.4*  HCT 26.6* 23.2*   < > 19.4* 20.0* 23.5* 23.6* 24.5*  MCV 89.3 94.7  --  89.4  --   --   --   --   PLT 234 256  --  222  --   --   --   --    < > = values in this interval not displayed.    Basic Metabolic Panel: Recent Labs  Lab 05/12/23 0853 05/13/23 0439 05/16/23 0907 05/16/23 1032 05/17/23 0318 05/18/23 0747  NA 136 137 131* 129* 130* 132*  K 4.1 4.0 3.9 3.7 3.2* 3.1*  CL 106 102 96*  --  96* 93*  CO2 15* 19* 16*  --  21* 24  GLUCOSE 104* 89 96  --  90 94  BUN 86* 98* 107*  --  63* 27*  CREATININE 5.37* 5.34* 6.03*  --  3.82* 2.49*  CALCIUM 7.8* 7.3* 6.9*  --  6.8* 7.7*  MG  --   --   --   --  1.9  --   PHOS  --   --   --   --  6.3* 3.1   GFR: Estimated Creatinine Clearance: 22.2 mL/min (A) (by C-G formula based on SCr of 2.49 mg/dL (H)). Recent Labs  Lab 05/12/23 0853 05/16/23 0907 05/16/23 0916 05/16/23 1038 05/17/23 0318  WBC 5.6 8.1  --    --  7.0  LATICACIDVEN  --   --  1.0 0.7  --     Liver Function Tests: Recent Labs  Lab 05/16/23 0907 05/18/23 0747  AST 41  --   ALT 81*  --   ALKPHOS 96  --   BILITOT 0.9  --   PROT 6.7  --   ALBUMIN 3.2* 2.3*   No results for input(s): "LIPASE", "AMYLASE" in the last 168 hours. No results for input(s): "AMMONIA" in the last 168 hours.  ABG    Component Value Date/Time   PHART 7.310 (L) 10/25/2022 1354   PCO2ART 15.2 (LL) 10/25/2022 1354  PO2ART 193 (H) 10/25/2022 1354   HCO3 16.6 (L) 05/16/2023 1032   TCO2 17 (L) 05/16/2023 1032   ACIDBASEDEF 7.0 (H) 05/16/2023 1032   O2SAT 84 05/16/2023 1032     Coagulation Profile: Recent Labs  Lab 05/16/23 0920 05/16/23 1550  INR 1.0 1.1    Cardiac Enzymes: No results for input(s): "CKTOTAL", "CKMB", "CKMBINDEX", "TROPONINI" in the last 168 hours.  HbA1C: Hgb A1c MFr Bld  Date/Time Value Ref Range Status  05/16/2023 12:20 PM 5.0 4.8 - 5.6 % Final    Comment:    (NOTE) Pre diabetes:          5.7%-6.4%  Diabetes:              >6.4%  Glycemic control for   <7.0% adults with diabetes   10/25/2022 08:53 PM 4.6 (L) 4.8 - 5.6 % Final    Comment:    (NOTE) Pre diabetes:          5.7%-6.4%  Diabetes:              >6.4%  Glycemic control for   <7.0% adults with diabetes     CBG: Recent Labs  Lab 05/17/23 1536 05/17/23 1920 05/17/23 2310 05/18/23 0400 05/18/23 0749  GLUCAP 124* 154* 91 82 98    Review of Systems:   N/a  Past Medical History:  She,  has a past medical history of Alcohol abuse, Allergy, Anxiety, Arthritis, Asthma, Cannabis abuse, Cocaine abuse (HCC), COPD (chronic obstructive pulmonary disease) (HCC), Depression, Drug addiction (HCC), GERD (gastroesophageal reflux disease), Heart murmur, Homelessness, Hyperlipidemia, Hypertension, PFO (patent foramen ovale), Seasonal allergies, and 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   Surgical  History:   Past Surgical History:  Procedure Laterality Date   A/V FISTULAGRAM Right 01/31/2023   Procedure: A/V Fistulagram;  Surgeon: Cephus Shelling, MD;  Location: [facility] INVASIVE CV LAB;  Service: Cardiovascular;  Laterality: Right;   AV FISTULA PLACEMENT Right 08/14/2022   Procedure: RIGHT ARM BRACHIOBASILIC ATERIOVENOUS FISTULA CREATION;  Surgeon: Leonie Douglas, MD;  Location: [facility] OR;  Service: Vascular;  Laterality: Right;   BASCILIC VEIN TRANSPOSITION Right 11/13/2022   Procedure: RIGHT ARM SECOND STAGE BASILIC VEIN TRANSPOSITION;  Surgeon: Leonie Douglas, MD;  [facility] OR;  Service: Vascular;  Laterality: Right;   BIOPSY  01/01/2019   Procedure: BIOPSY;  Surgeon: Beverley Fiedler, MD;  Location: [facility] ENDOSCOPY;  Service: Endoscopy;;   BIOPSY  11/17/2021   Procedure: BIOPSY;  Surgeon: Lynann Bologna, MD;  Location: [facility] ENDOSCOPY;  Service: Gastroenterology;;   CESAREAN SECTION  1989   COLONOSCOPY  11/07/2020   2018   CYSTOSCOPY W/ URETERAL STENT PLACEMENT Left 08/01/2018   Procedure: CYSTOSCOPY WITH RETROGRADE PYELOGRAM/URETERAL STENT PLACEMENT;  Surgeon: Crista Elliot, MD;  Location: [facility] ORS;  Service: Urology;  Laterality: Left;   CYSTOSCOPY WITH RETROGRADE PYELOGRAM, URETEROSCOPY AND STENT PLACEMENT Left 01/15/2019   Procedure: CYSTOSCOPY WITH RETROGRADE PYELOGRAM, URETEROSCOPY AND STENT PLACEMENT;  Surgeon: Crista Elliot, MD;  Location: [facility] ORS;  Service: Urology;  Laterality: Left;   ENTEROSCOPY N/A 08/16/2022   Procedure: ENTEROSCOPY;  Surgeon: Meridee Score Netty Starring., MD;  Location: [facility] ENDOSCOPY;  Service: Gastroenterology;  Laterality: N/A;   ENTEROSCOPY N/A 10/31/2022   Procedure: ENTEROSCOPY;  Surgeon: Beverley Fiedler, MD;  Location: [facility] ENDOSCOPY;  Service: Gastroenterology;  Laterality: N/A;   ENTEROSCOPY N/A 05/09/2023   Procedure: ENTEROSCOPY;  Surgeon: [name], MD;  Location: [facility]  ENDOSCOPY;  Service: Gastroenterology;  Laterality: N/A;    ESOPHAGOGASTRODUODENOSCOPY (EGD) WITH PROPOFOL N/A 01/01/2019   Procedure: ESOPHAGOGASTRODUODENOSCOPY (EGD) WITH PROPOFOL;  Surgeon: Beverley Fiedler, MD;  Location: [facility] ENDOSCOPY;  Service: Endoscopy;  Laterality: N/A;   ESOPHAGOGASTRODUODENOSCOPY (EGD) WITH PROPOFOL N/A 08/21/2019   Procedure: ESOPHAGOGASTRODUODENOSCOPY (EGD) WITH PROPOFOL;  Surgeon: Napoleon Form, MD;  Location: [facility] ENDOSCOPY;  Service: Endoscopy;  Laterality: N/A;   ESOPHAGOGASTRODUODENOSCOPY (EGD) WITH PROPOFOL N/A 11/17/2021   Procedure: ESOPHAGOGASTRODUODENOSCOPY (EGD) WITH PROPOFOL;  Surgeon: Lynann Bologna, MD;  Location: [facility] ENDOSCOPY;  Service: Gastroenterology;  Laterality: N/A;   FRACTURE SURGERY Left 2011   arm   HEMOSTASIS CLIP PLACEMENT  08/16/2022   Procedure: HEMOSTASIS CLIP PLACEMENT;  Surgeon: Lemar Lofty., MD;  Location: [facility] ENDOSCOPY;  Service: Gastroenterology;;   HOT HEMOSTASIS N/A 08/16/2022   Procedure: HOT HEMOSTASIS (ARGON PLASMA COAGULATION/BICAP);  Surgeon: Lemar Lofty., MD;  Location: [facility] ENDOSCOPY;  Service: Gastroenterology;  Laterality: N/A;   HOT HEMOSTASIS N/A 10/31/2022   Procedure: HOT HEMOSTASIS (ARGON PLASMA COAGULATION/BICAP);  Surgeon: Beverley Fiedler, MD;  Location: [facility] ENDOSCOPY;  Service: Gastroenterology;  Laterality: N/A;   HOT HEMOSTASIS N/A 05/09/2023   Procedure: HOT HEMOSTASIS (ARGON PLASMA COAGULATION/BICAP);  Surgeon: [name], MD;  Location: [facility] ENDOSCOPY;  Service: Gastroenterology;  Laterality: N/A;   INSERTION OF DIALYSIS CATHETER Right 08/14/2022   Procedure: INSERTION OF TUNNELED DIALYSIS CATHETER USING PALINDROME CATHETER KIT 19CM;  Surgeon: Leonie Douglas, MD;  Location: [facility] OR;  Service: Vascular;  Laterality: Right;   POLYPECTOMY  08/16/2022   Procedure: POLYPECTOMY;  Surgeon: Lemar Lofty., MD;  Location: [facility] ENDOSCOPY;  Service: Gastroenterology;;   SUBMUCOSAL TATTOO INJECTION  08/16/2022   Procedure: SUBMUCOSAL TATTOO INJECTION;  Surgeon:  Lemar Lofty., MD;  Location: [facility] ENDOSCOPY;  Service: Gastroenterology;;   UPPER GASTROINTESTINAL ENDOSCOPY       Social History:   reports that she has been smoking cigarettes. She has been exposed to tobacco smoke. She has never used smokeless tobacco. She reports that she does not currently use alcohol. She reports current drug use. Drug: Marijuana.   Family History:  Her family history includes Diabetes in her father. There is no history of Colon cancer, Esophageal cancer, Rectal cancer, or Stomach cancer.   Allergies Allergies  Allergen Reactions   Zestril [Lisinopril] Other (See Comments)    Hyperkalemia     Home Medications  Prior to Admission medications   Medication Sig Start Date End Date Taking? Authorizing Provider  albuterol (VENTOLIN HFA) 108 (90 Base) MCG/ACT inhaler Inhale 2 puffs into the lungs every 6 (six) hours as needed for wheezing or shortness of breath. Patient taking differently: Inhale 3 puffs into the lungs every 6 (six) hours as needed for wheezing or shortness of breath. 02/01/23   Ghimire, Werner Lean, MD  amLODipine (NORVASC) 10 MG tablet Take 1 tablet (10 mg total) by mouth daily. 05/13/23   Elgergawy, Leana Roe, MD  atorvastatin (LIPITOR) 10 MG tablet Take 1 tablet (10 mg total) by mouth daily. 05/13/23   Elgergawy, Leana Roe, MD  busPIRone (BUSPAR) 10 MG tablet Take 1 tablet (10 mg total) by mouth 2 (two) times daily. 02/10/23   Anders Simmonds, PA-C  ferrous sulfate 325 (65 FE) MG tablet Take 1 tablet (325 mg total) by mouth daily with breakfast. 02/01/23   Ghimire, Werner Lean, MD  Fluticasone-Umeclidin-Vilant (TRELEGY ELLIPTA) 100-62.5-25 MCG/ACT AEPB Inhale 1 puff into the lungs daily. 02/01/23   Ghimire, Werner Lean, MD  gabapentin (NEURONTIN) 100 MG capsule Take 1 capsule (100 mg total) by mouth 3 (three) times daily. 02/01/23   Ghimire, Werner Lean, MD  guaiFENesin (MUCINEX) 600 MG 12 hr tablet Take 1 tablet (600 mg total) by mouth 2 (two) times  daily for 10 days. 05/13/23 05/23/23  Elgergawy, Leana Roe, MD  HYDROcodone-acetaminophen (NORCO/VICODIN) 5-325 MG tablet Take 1 tablet by mouth every 6 (six) hours as needed for severe pain (pain score 7-10). 05/13/23   Elgergawy, Leana Roe, MD  hydrOXYzine (ATARAX) 25 MG tablet Take 1 tablet (25 mg total) by mouth 3 (three) times daily as needed for anxiety or itching. 04/23/23   [name], MD  ipratropium-albuterol (DUONEB) 0.5-2.5 (3) MG/3ML SOLN Take 3 mLs by nebulization every 6 (six) hours as needed (shortness of breath and wheezing). 05/01/23   [name], MD  methylPREDNISolone (MEDROL DOSEPAK) 4 MG TBPK tablet follow package directions 05/13/23   Elgergawy, Leana Roe, MD  multivitamin (RENA-VIT) TABS tablet Take 1 tablet by mouth at bedtime. 02/01/23   Ghimire, Werner Lean, MD  nicotine (NICODERM CQ - DOSED IN MG/24 HOURS) 21 mg/24hr patch Place 1 patch (21 mg total) onto the skin daily. 05/13/23   Elgergawy, Leana Roe, MD  pantoprazole (PROTONIX) 40 MG tablet Take 1 tablet (40 mg total) by mouth 2 (two) times daily. 05/13/23 08/11/23  Elgergawy, Leana Roe, MD  sodium bicarbonate 650 MG tablet Take 2 tablets (1,300 mg total) by mouth 2 (two) times daily. 05/13/23   Elgergawy, Leana Roe, MD  torsemide (DEMADEX) 100 MG tablet Take 1 tablet (100 mg total) by mouth daily. 05/13/23   Elgergawy, Leana Roe, MD  traZODone (DESYREL) 50 MG tablet Take 1 tablet (50 mg total) by mouth at bedtime as needed for sleep. 05/13/23   Elgergawy, Leana Roe, MD     Critical care time: n/a      Karren Burly, MD [facility] See [facility] on call pager [phone] until 7pm. Please call Elink 7p-7a. [phone]  05/18/2023 10:21 AM

## 2023-05-18 NOTE — Discharge Summary (Signed)
 Physician Discharge Summary   Patient ID: LOUAN BASE [id number] 63 y.o. 03-Jul-1960  Admit date: 05/16/2023  Discharge date and time: 05/18/2023  3:57 PM   Admitting Physician: Chilton Greathouse, MD   Discharge Physician: Karren Burly, MD  Admission Diagnoses: Acute respiratory failure with hypoxia (HCC) [J96.01] AKI (acute kidney injury) (HCC) [N17.9] Acute on chronic respiratory failure with hypoxia (HCC) [J96.21] COVID [U07.1]  Discharge Diagnoses:  Principal Problem:   Acute on chronic respiratory failure with hypoxia (HCC) Active Problems:   Acute renal failure superimposed on stage 5 chronic kidney disease, not on chronic dialysis (HCC)   COVID   Acute pulmonary edema (HCC)   ESRD (end stage renal disease) (HCC)    Admission Condition: critical  Discharged Condition: against medical advice  Indication for Admission: Acute hypoxemic respiratory failure   Hospital Course:   63 year old woman history of polysubstance abuse, CKD 5/ESRD formally on dialysis dismissed with multiple dialysis delays due to noncompliance who presents with worsening shortness of breath with chest imaging concerning for volume overload found to be in acute hypoxemic respiratory failure.  Longstanding history of refusing dialysis.  She reported tells me she was told to come to the ED for dialysis.  She states she has no permanent address that when she cannot get rides to dialysis.  Notably she does have a home she lives in.  And has an address.  She could not comprehend the idea of she actually did have a address for pickup for dialysis.  Although it mostly sounds like he has been dismissed from multiple dialysis images for not showing up.  Despite rides being arranged etc.  In the ED she initially said she will be DNR/DNI.  She would be okay with some dialysis given her symptoms.  She later changed her status to full code.  She received 2 sessions of dialysis, 4.9 L of ultrafiltrate.  She was  weaned from high flow nasal cannula to salter nasal cannula.  3 L.  She became frustrated with having to stay in the hospital.  Was having diarrhea.  She took off all the monitors and oxygen.  Discussed with her the need to stay for trying to figure out social plan for dialysis as well as her hypoxemia, previously on 3 L nasal cannula.  She endorses no oxygen at home.  She was able to explain risks and benefits of leaving without being medically discharged.  Subsequently she left AGAINST MEDICAL ADVICE.  Consults:  As per EMR  Significant Diagnostic Studies: As per EMR  Treatments: As per EMR   There are no questions and answers to display.        Patient Instructions:  N/a  SignedLesia Sago [facility] 05/18/2023 5:07 PM

## 2023-05-18 NOTE — Progress Notes (Signed)
 Patient bed alarm sounded, entered room to patient getting up alone and stating that she doesn't need any help and removing all equipment, including nasal canula. Explained to patient the risk to her health and danger of accidental injury. Patient refused care and stated she is not sick. Dr. Judeth Horn informed and came to bedside. MD explained importance of hospitalization, patient stated she is "better and will come back if I get worse or can't breathe." Patient is Alert and oriented x4. AMA paperwork explained and signed. PIV's removed.

## 2023-05-18 NOTE — Progress Notes (Signed)
 [facility] Progress Note  Name: [name] MRN: [id number] DOB: 1960/06/22  Chief Complaint:  Shortness of breath   Subjective:  Spoke with her RN.  Patient was irritable throughout the night.  No labs yet.  Phlebotomy tried once to get her labs and she is willing to let another person try later.  She kept taking off the heated high flow earlier.  Last HD on 3/1 with 2.7 kg UF.  She had 400 mL UOP and 1 unmeasured urine void over 3/1.  She stated previously that would be her last HD treatment and then she just wants to be kept comfortable off of dialysis, knowing that this will lead to her death.  She was able to talk with her daughter yesterday.   Today she states "I don't feel no different" but then becomes tearful and frustrated and lists several other people that she would like to talk to.  She states that she "never wanted dialysis to begin with".   She states that this (getting dialysis or not) is a big decision.     Review of systems:   She states "I can breathe just fine" and "I came in because I was coughing and didn't know what was wrong".   Denies chest pain Denies n/v  ------------- Background on consult:  Patient with ESRD, alcohol use, polysubstance abuse, hypertension, COPD, and homelessness who presented to the hospital with shortness of breath.  She states that she could not breathe "all of a sudden".  She is unable to provide much history.  She was found to be covid positive.  She has been off of dialysis for several months due to noncompliance and was recently reclassified as CKD stage V due to the same.  She has been on high flow nasal cannula.  Her CXR demonstrated increased lung base opacities and small effusions.        Intake/Output Summary (Last 24 hours) at 05/18/2023 0553 Last data filed at 05/18/2023 0315 Gross per 24 hour  Intake 1872.55 ml  Output 3100 ml  Net -1227.45 ml    Vitals:  Vitals:   05/18/23 0300 05/18/23 0315 05/18/23 0345  05/18/23 0446  BP: (!) 178/75 (!) 179/79  (!) 169/65  Pulse: 72 80    Resp: 17 14    Temp:  98 F (36.7 C) (!) 100.9 F (38.3 C)   TempSrc:  Oral Oral   SpO2: 100% 99%    Weight:      Height:         Physical Exam:   General adult female in bed on heated high flow oxygen, short of breath and appears chronically ill and older than stated age HEENT normocephalic atraumatic extraocular movements intact sclera anicteric Neck supple trachea midline Lungs decreased breath sounds and increased work of breathing with exertion on heated high flow oxygen  Heart S1S2  Abdomen soft nontender nondistended Extremities trace edema lower extremities Psych appropriate mood and affect Neuro awake and conversant with limitation of respiratory status, follows commands   Medications reviewed   Labs:     Latest Ref Rng & Units 05/17/2023    3:18 AM 05/16/2023   10:32 AM 05/16/2023    9:07 AM  BMP  Glucose 70 - 99 mg/dL 90   96   BUN 8 - 23 mg/dL 63   621   Creatinine 3.08 - 1.00 mg/dL 6.57   8.46   Sodium 962 - 145 mmol/L 130  129  131  Potassium 3.5 - 5.1 mmol/L 3.2  3.7  3.9   Chloride 98 - 111 mmol/L 96   96   CO2 22 - 32 mmol/L 21   16   Calcium 8.9 - 10.3 mg/dL 6.8   6.9      Assessment/Plan:   # ESRD - Please continued goals of care discussions per primary team  - Goals of care are in flux.  She received HD on 3/1 evening.  She previously expressed that she just wants to be kept comfortable off of dialysis, knowing that this will lead to her death however today states that she is not ready to make that decision.   - Assess needs and willingness for HD daily.  No indication for HD today - We have discussed that I do believe she has end-stage renal disease and would otherwise continue to need hemodialysis in order to stay alive.   - Note that she has likely lost some of her limited reserve - Would consult palliative care for continued assistance.     # Acute hypoxic respiratory  failure  - Secondary to COVID-19 pneumonia as well as fluid overload in the setting of dialysis noncompliance - We have optimized volume status with HD x 2 treatments thus far   # Covid 19 PNA - s/p HD x 2 to optimize   - Therapies per primary team - on solumedrol    # Metabolic acidosis - s/p HD as above   # HTN  - Improved with UF   # Anemia of CKD - aranesp 150 mcg once on 3/1. Ordered for every Saturday  - s/p PRBC's once on 3/1   Disposition - continue inpatient monitoring.  Note goals of care are in flux.    Estanislado Emms, MD 05/18/2023 6:20 AM

## 2023-05-18 NOTE — Progress Notes (Signed)
  Alert and oriented.  Informed consent signed and in chart.   TX duration: 3.5 hours  Patient tolerated well.   Alert, without acute distress.  Hand-off given to patient's nurse.   Access used: Fistula Access issues: none  Total UF removed: 2700 ml Medication(s) given: none Post HD VS: 179/79 Post HD weight: unable to obtain    05/18/23 0315  Vitals  Temp 98 F (36.7 C)  Temp Source Oral  BP (!) 179/79  MAP (mmHg) 109  BP Location Left Arm  BP Method Automatic  Patient Position (if appropriate) Lying  Pulse Rate 80  Pulse Rate Source Monitor  ECG Heart Rate 79  Resp 14  Oxygen Therapy  SpO2 99 %  O2 Device HHFNC  Post Treatment  Dialyzer Clearance Lightly streaked  Hemodialysis Intake (mL) 0 mL  Liters Processed 62.8  Fluid Removed (mL) 2700 mL  Tolerated HD Treatment Yes  Post-Hemodialysis Comments HD tx achieved as expected, tolerated well.  AVG/AVF Arterial Site Held (minutes) 10 minutes  AVG/AVF Venous Site Held (minutes) 20 minutes  Fistula / Graft Right Upper arm Arteriovenous fistula  Placement Date/Time: 08/14/22 1230   Orientation: Right  Access Location: (c) Upper arm  Access Type: Arteriovenous fistula  Site Condition No complications  Fistula / Graft Assessment Present;Thrill;Bruit  Status Deaccessed  Drainage Description None

## 2023-05-19 ENCOUNTER — Telehealth: Payer: Self-pay

## 2023-05-19 NOTE — Transitions of Care (Post Inpatient/ED Visit) (Signed)
   05/19/2023  Name: DASHANA GUIZAR MRN: [id number] DOB: April 21, 1960  Today's TOC FU Call Status: Today's TOC FU Call Status:: Unsuccessful Call (1st Attempt) Unsuccessful Call (1st Attempt) Date: 05/19/23  Attempted to reach the patient regarding the most recent Inpatient/ED visit.  Follow Up Plan: Additional outreach attempts will be made to reach the patient to complete the Transitions of Care (Post Inpatient/ED visit) call.   Signature  Robyne Peers, RN

## 2023-05-20 ENCOUNTER — Telehealth: Payer: Self-pay

## 2023-05-20 NOTE — Telephone Encounter (Signed)
 FYI

## 2023-05-20 NOTE — Transitions of Care (Post Inpatient/ED Visit) (Signed)
   05/20/2023  Name: [name] MRN: [id number] DOB: [birth year]  Today's TOC FU Call Status: Today's TOC FU Call Status:: Unsuccessful Call (2nd Attempt) Unsuccessful Call (1st Attempt) Date: 05/19/23 Unsuccessful Call (2nd Attempt) Date: 05/20/23  Attempted to reach the patient regarding the most recent Inpatient/ED visit.  Follow Up Plan: Additional outreach attempts will be made to reach the patient to complete the Transitions of Care (Post Inpatient/ED visit) call.   Signature  Robyne Peers, RN

## 2023-05-20 NOTE — Telephone Encounter (Unsigned)
 Copied from CRM [id number]. Topic: General - Other >> May 20, 2023  2:16 PM Archie Patten S wrote: Reason for CRM: Patient is returning a call from [place]. Callback number is [phone]

## 2023-05-20 NOTE — Progress Notes (Signed)
.  25mg  of xanax wasted with Lennox Laity and this RN at 2255 of fent wasted with this RN and Dorann Lodge at [phone]

## 2023-05-21 ENCOUNTER — Telehealth: Payer: Self-pay

## 2023-05-21 LAB — CULTURE, BLOOD (ROUTINE X 2)
Culture: NO GROWTH
Culture: NO GROWTH

## 2023-05-21 NOTE — Telephone Encounter (Signed)
Call returned to patient, message left with call back requested.  

## 2023-05-21 NOTE — Transitions of Care (Post Inpatient/ED Visit) (Signed)
   05/21/2023  Name: [name] MRN: [id number] DOB: 07-Sep-1960  Today's TOC FU Call Status: Today's TOC FU Call Status:: Unsuccessful Call (3rd Attempt) Unsuccessful Call (1st Attempt) Date: 05/19/23 Unsuccessful Call (2nd Attempt) Date: 05/20/23 Unsuccessful Call (3rd Attempt) Date: 05/21/23  Attempted to reach the patient regarding the most recent Inpatient/ED visit.  Follow Up Plan: No further outreach attempts will be made at this time. We have been unable to contact the patient.  The patient has an appointment with Corene Cornea, PA at [facility] tomorrow, 05/22/2023.   Signature Robyne Peers, RN

## 2023-05-22 ENCOUNTER — Inpatient Hospital Stay: Payer: Medicaid Other | Admitting: Physician Assistant

## 2023-05-22 NOTE — Progress Notes (Deleted)
 Patient ID: [name], female   DOB: May 26, 1960, 63 y.o.   MRN: [id number]  After hospitalization 2/28-05/18/2023 Admission Diagnoses: Acute respiratory failure with hypoxia (HCC) [J96.01] AKI (acute kidney injury) (HCC) [N17.9] Acute on chronic respiratory failure with hypoxia (HCC) [J96.21] COVID [U07.1]   Discharge Diagnoses:  Principal Problem:   Acute on chronic respiratory failure with hypoxia (HCC) Active Problems:   Acute renal failure superimposed on stage 5 chronic kidney disease, not on chronic dialysis (HCC)   COVID   Acute pulmonary edema (HCC)   ESRD (end stage renal disease) (HCC)       Admission Condition: critical   Discharged Condition: against medical advice   Indication for Admission: Acute hypoxemic respiratory failure    Hospital Course:    63 year old woman history of polysubstance abuse, CKD 5/ESRD formally on dialysis dismissed with multiple dialysis delays due to noncompliance who presents with worsening shortness of breath with chest imaging concerning for volume overload found to be in acute hypoxemic respiratory failure.   Longstanding history of refusing dialysis.  She reported tells me she was told to come to the ED for dialysis.  She states she has no permanent address that when she cannot get rides to dialysis.  Notably she does have a home she lives in.  And has an address.  She could not comprehend the idea of she actually did have a address for pickup for dialysis.  Although it mostly sounds like he has been dismissed from multiple dialysis images for not showing up.  Despite rides being arranged etc.  In the ED she initially said she will be DNR/DNI.  She would be okay with some dialysis given her symptoms.  She later changed her status to full code.  She received 2 sessions of dialysis, 4.9 L of ultrafiltrate.  She was weaned from high flow nasal cannula to salter nasal cannula.  3 L.  She became frustrated with having to stay in the hospital.  Was  having diarrhea.  She took off all the monitors and oxygen.  Discussed with her the need to stay for trying to figure out social plan for dialysis as well as her hypoxemia, previously on 3 L nasal cannula.  She endorses no oxygen at home.  She was able to explain risks and benefits of leaving without being medically discharged.  Subsequently she left AGAINST MEDICAL ADVICE.

## 2023-05-26 ENCOUNTER — Other Ambulatory Visit: Payer: Self-pay | Admitting: Internal Medicine

## 2023-05-26 ENCOUNTER — Telehealth: Payer: Self-pay

## 2023-05-26 ENCOUNTER — Other Ambulatory Visit: Payer: Self-pay

## 2023-05-26 DIAGNOSIS — F1994 Other psychoactive substance use, unspecified with psychoactive substance-induced mood disorder: Secondary | ICD-10-CM

## 2023-05-26 DIAGNOSIS — F32A Depression, unspecified: Secondary | ICD-10-CM

## 2023-05-26 NOTE — Telephone Encounter (Signed)
 Copied from CRM [id number]. Topic: General - Call Back - No Documentation >> May 26, 2023 10:30 AM Ivette P wrote: Reason for CRM: pt calling in returning call from Jane, called [facility] and was told let pt know that Erskine Squibb would be calling pt back

## 2023-05-27 ENCOUNTER — Other Ambulatory Visit: Payer: Self-pay | Admitting: Internal Medicine

## 2023-05-27 ENCOUNTER — Other Ambulatory Visit: Payer: Self-pay

## 2023-05-27 DIAGNOSIS — F32A Depression, unspecified: Secondary | ICD-10-CM

## 2023-05-27 DIAGNOSIS — G8929 Other chronic pain: Secondary | ICD-10-CM

## 2023-05-27 DIAGNOSIS — F1994 Other psychoactive substance use, unspecified with psychoactive substance-induced mood disorder: Secondary | ICD-10-CM

## 2023-05-27 NOTE — Telephone Encounter (Signed)
 Copied from CRM [id number]. Topic: Clinical - Medication Refill >> May 27, 2023 12:36 PM Carlatta H wrote: Most Recent Primary Care Visit:  Provider: Anders Simmonds  Department: [facility]  Visit Type: HOSPITAL FU  Date: 02/10/2023  Medication:  gabapentin (NEURONTIN) 100 MG capsule [[id number] traZODone (DESYREL) 50 MG tablet [[id number]] hydrOXYzine (ATARAX) 25 MG tablet [[id number]]  Has the patient contacted their pharmacy? No (Agent: If no, request that the patient contact the pharmacy for the refill. If patient does not wish to contact the pharmacy document the reason why and proceed with request.) (Agent: If yes, when and what did the pharmacy advise?)  Is this the correct pharmacy for this prescription? Yes If no, delete pharmacy and type the correct one.  This is the patient's preferred pharmacy:  [facility] [address] [place] - [address] [place] [address] Phone: [phone] Fax: [phone]     Has the prescription been filled recently? No  Is the patient out of the medication? Yes  Has the patient been seen for an appointment in the last year OR does the patient have an upcoming appointment? No  Can we respond through MyChart? No  Agent: Please be advised that Rx refills may take up to 3 business days. We ask that you follow-up with your pharmacy.

## 2023-05-27 NOTE — Telephone Encounter (Signed)
 Last Fill: Trazodone: 05/13/23 30 tabs/0 RF     Hydroxyzine: 04/23/23 90 tabs/0 RF     Gabapentin: 02/01/23  Last OV: 02/10/23 Next OV: 06/11/23  Routing to provider for review/authorization.

## 2023-05-28 ENCOUNTER — Other Ambulatory Visit (HOSPITAL_COMMUNITY): Payer: Self-pay

## 2023-05-28 ENCOUNTER — Other Ambulatory Visit: Payer: Self-pay

## 2023-05-29 NOTE — Congregational Nurse Program (Signed)
 RN received requested [employer] RN Erskine Squibb B., to see if this RN would be able to assist client with housing resources. RN called and spoke to [facility] about resources and RN will email her affordable housing list to be able to find housing, she said she can manage without assistance but will schedule a time with RN next week if necessary. RN also offered resources for rooming houses in [place]. She states she has lost everything and needs furniture and clothes. RN states [employer] can help with vouchers for clothes and there are resources for furniture that we can use to assist with furniture.  She states she is recovering from a cold/pneumonia and is not sure when she is going to feel better to be able to walk from [place] to [employer] [facility] right now. She is also working on picking up her medicines from [place] and has bus passes to get there. RN asked about dialysis and client also wishes to not continue dialysis at this time but will re-evaluate if and when she will go back, she states she is still recovering from her last ER visit and doesn't want to go back. RN will continue following up with this client to help assist as necessary.

## 2023-06-01 NOTE — Telephone Encounter (Signed)
 noted

## 2023-06-11 ENCOUNTER — Ambulatory Visit: Attending: Physician Assistant | Admitting: Physician Assistant

## 2023-06-11 ENCOUNTER — Encounter: Payer: Self-pay | Admitting: Physician Assistant

## 2023-06-11 VITALS — BP 160/70 | HR 86 | Resp 16 | Ht 67.0 in | Wt 119.0 lb

## 2023-06-11 DIAGNOSIS — F1994 Other psychoactive substance use, unspecified with psychoactive substance-induced mood disorder: Secondary | ICD-10-CM

## 2023-06-11 DIAGNOSIS — Z09 Encounter for follow-up examination after completed treatment for conditions other than malignant neoplasm: Secondary | ICD-10-CM

## 2023-06-11 DIAGNOSIS — F419 Anxiety disorder, unspecified: Secondary | ICD-10-CM

## 2023-06-11 DIAGNOSIS — G8929 Other chronic pain: Secondary | ICD-10-CM

## 2023-06-11 DIAGNOSIS — N186 End stage renal disease: Secondary | ICD-10-CM | POA: Diagnosis not present

## 2023-06-11 DIAGNOSIS — Z5900 Homelessness unspecified: Secondary | ICD-10-CM

## 2023-06-11 DIAGNOSIS — J449 Chronic obstructive pulmonary disease, unspecified: Secondary | ICD-10-CM | POA: Diagnosis not present

## 2023-06-11 DIAGNOSIS — Z992 Dependence on renal dialysis: Secondary | ICD-10-CM

## 2023-06-11 DIAGNOSIS — F418 Other specified anxiety disorders: Secondary | ICD-10-CM

## 2023-06-11 DIAGNOSIS — Z9189 Other specified personal risk factors, not elsewhere classified: Secondary | ICD-10-CM

## 2023-06-11 DIAGNOSIS — I10 Essential (primary) hypertension: Secondary | ICD-10-CM | POA: Diagnosis not present

## 2023-06-11 DIAGNOSIS — D649 Anemia, unspecified: Secondary | ICD-10-CM

## 2023-06-11 MED ORDER — ALBUTEROL SULFATE HFA 108 (90 BASE) MCG/ACT IN AERS: 2.0000 | INHALATION_SPRAY | Freq: Four times a day (QID) | RESPIRATORY_TRACT | 1 refills | Status: DC | PRN

## 2023-06-11 MED ORDER — AMLODIPINE BESYLATE 10 MG PO TABS: 10.0000 mg | ORAL_TABLET | Freq: Every day | ORAL | 5 refills | Status: DC

## 2023-06-11 MED ORDER — ATORVASTATIN CALCIUM 10 MG PO TABS: 10.0000 mg | ORAL_TABLET | Freq: Every day | ORAL | 5 refills | Status: DC

## 2023-06-11 MED ORDER — PREDNISONE 10 MG PO TABS: 10.0000 mg | ORAL_TABLET | Freq: Every day | ORAL | 0 refills | Status: DC

## 2023-06-11 MED ORDER — TRAZODONE HCL 50 MG PO TABS: 50.0000 mg | ORAL_TABLET | Freq: Every evening | ORAL | 2 refills | Status: DC | PRN

## 2023-06-11 MED ORDER — NICOTINE 21 MG/24HR TD PT24: 21.0000 mg | MEDICATED_PATCH | Freq: Every day | TRANSDERMAL | Status: DC

## 2023-06-11 MED ORDER — TRAMADOL HCL 50 MG PO TABS: 50.0000 mg | ORAL_TABLET | Freq: Three times a day (TID) | ORAL | 0 refills | Status: DC | PRN

## 2023-06-11 MED ORDER — HYDROXYZINE HCL 25 MG PO TABS: 25.0000 mg | ORAL_TABLET | Freq: Three times a day (TID) | ORAL | 2 refills | Status: DC | PRN

## 2023-06-11 MED ORDER — TRELEGY ELLIPTA 100-62.5-25 MCG/ACT IN AEPB: 1.0000 | INHALATION_SPRAY | Freq: Every day | RESPIRATORY_TRACT | 3 refills | Status: DC

## 2023-06-11 NOTE — Progress Notes (Signed)
 SOB, DOE with walking States this happens when she needs blood Left arm pain  Tightness in chest Asking for pain mediction because she hurts all the time  Cough, productive, with green phlegm Wants patches for smoking cessation

## 2023-06-11 NOTE — Progress Notes (Signed)
 Patient ID: [name], female   DOB: May 26, 1960, 63 y.o.   MRN: [id number]    Ave Scharnhorst, is a 63 y.o. female  NWG:[id number]  VHQ:[id number]  DOB - Jul 17, 1960  Chief Complaint  Patient presents with   Shortness of Breath   Cough       Subjective:   [name] is a 63 y.o. female here today for a follow up visit After 2/28-05/18/2023 hospitalization for Covid and left AMA.  Says she no longer drinks alcohol and has not been using substances.  She is requesting something for chronic pain.    Says she is still having more wheezing than her baseline since completing the prednisone.  She also says she is started to have more SOB on exertion and the pain in her L arm she usu has when she needs a transfusion. She was given a transfusion at the hospital then left AMA.    She is still refusing dialysis and is still smoking about 10 cigs per day.  She is currently homeless but stays with a friend.    From discharge summary Admission Diagnoses: Acute respiratory failure with hypoxia (HCC) [J96.01] AKI (acute kidney injury) (HCC) [N17.9] Acute on chronic respiratory failure with hypoxia (HCC) [J96.21] COVID [U07.1]   Discharge Diagnoses:  Principal Problem:   Acute on chronic respiratory failure with hypoxia (HCC) Active Problems:   Acute renal failure superimposed on stage 5 chronic kidney disease, not on chronic dialysis (HCC)   COVID   Acute pulmonary edema (HCC)   ESRD (end stage renal disease) (HCC)       Admission Condition: critical   Discharged Condition: against medical advice   Indication for Admission: Acute hypoxemic respiratory failure    Hospital Course:    63 year old woman history of polysubstance abuse, CKD 5/ESRD formally on dialysis dismissed with multiple dialysis delays due to noncompliance who presents with worsening shortness of breath with chest imaging concerning for volume overload found to be in acute hypoxemic respiratory failure.   Longstanding  history of refusing dialysis.  She reported tells me she was told to come to the ED for dialysis.  She states she has no permanent address that when she cannot get rides to dialysis.  Notably she does have a home she lives in.  And has an address.  She could not comprehend the idea of she actually did have a address for pickup for dialysis.  Although it mostly sounds like he has been dismissed from multiple dialysis images for not showing up.  Despite rides being arranged etc.  In the ED she initially said she will be DNR/DNI.  She would be okay with some dialysis given her symptoms.  She later changed her status to full code.  She received 2 sessions of dialysis, 4.9 L of ultrafiltrate.  She was weaned from high flow nasal cannula to salter nasal cannula.  3 L.  She became frustrated with having to stay in the hospital.  Was having diarrhea.  She took off all the monitors and oxygen.  Discussed with her the need to stay for trying to figure out social plan for dialysis as well as her hypoxemia, previously on 3 L nasal cannula.  She endorses no oxygen at home.  She was able to explain risks and benefits of leaving without being medically discharged.  Subsequently she left AGAINST MEDICAL ADVICE. No problems updated.  ALLERGIES: Allergies  Allergen Reactions   Zestril [Lisinopril] Other (See Comments)    Hyperkalemia  PAST MEDICAL HISTORY: Past Medical History:  Diagnosis Date   Alcohol abuse    Allergy    Anxiety    Arthritis    Asthma    Cannabis abuse    Cocaine abuse (HCC)    COPD (chronic obstructive pulmonary disease) (HCC)    Depression    Drug addiction (HCC)    GERD (gastroesophageal reflux disease)    Heart murmur    Homelessness    Hyperlipidemia    Hypertension    pt stated "every once in a while BP will be high but has not been prescribed medication for HTN.    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 Authorizing Provider  busPIRone (BUSPAR) 10 MG tablet Take 1 tablet (10 mg total) by mouth 2 (two) times daily. 02/10/23  Yes Anders Simmonds, PA-C  ferrous sulfate 325 (65 FE) MG tablet Take 1 tablet (325 mg total) by mouth daily with breakfast. 02/01/23  Yes Ghimire, Werner Lean, MD  gabapentin (NEURONTIN) 100 MG capsule Take 1 capsule (100 mg total) by mouth 3 (three) times daily. 02/01/23  Yes Ghimire, Werner Lean, MD  HYDROcodone-acetaminophen (NORCO/VICODIN) 5-325 MG tablet Take 1 tablet by mouth every 6 (six) hours as needed for severe pain (pain score 7-10). 05/13/23  Yes Elgergawy, Leana Roe, MD  ipratropium-albuterol (DUONEB) 0.5-2.5 (3) MG/3ML SOLN Take 3 mLs by nebulization every 6 (six) hours as needed (shortness of breath and wheezing). 05/01/23  Yes [name], MD  methylPREDNISolone (MEDROL DOSEPAK) 4 MG TBPK tablet follow package directions 05/13/23  Yes Elgergawy, Leana Roe, MD  multivitamin (RENA-VIT) TABS tablet Take 1 tablet by mouth at bedtime. 02/01/23  Yes Ghimire, Werner Lean, MD  nicotine (NICODERM CQ - DOSED IN MG/24 HOURS) 21 mg/24hr patch Place 1 patch (21 mg total) onto the skin daily. 05/13/23  Yes Elgergawy, Leana Roe, MD  pantoprazole (PROTONIX) 40 MG tablet Take 1 tablet (40 mg total) by mouth 2 (two) times daily. 05/13/23 08/11/23 Yes Elgergawy, Leana Roe, MD  predniSONE (DELTASONE) 10 MG tablet Take 1 tablet (10 mg total) by mouth daily with breakfast. 06/11/23  Yes Jacey Eckerson M, PA-C  sodium bicarbonate 650 MG tablet Take 2 tablets (1,300 mg total) by mouth 2 (two) times daily. 05/13/23  Yes Elgergawy, Leana Roe, MD  torsemide (DEMADEX) 100 MG tablet Take 1 tablet (100 mg total) by mouth daily. 05/13/23  Yes Elgergawy, Leana Roe, MD  traMADol (ULTRAM) 50 MG tablet Take 1 tablet (50 mg total) by  mouth every 8 (eight) hours as needed for up to 5 days. 06/11/23 06/16/23 Yes Enzley Kitchens, Marzella Schlein, PA-C  albuterol (VENTOLIN HFA) 108 (90 Base) MCG/ACT inhaler Inhale 2 puffs into the lungs every 6 (six) hours as needed for wheezing or shortness of breath. 06/11/23   Anders Simmonds, PA-C  amLODipine (NORVASC) 10 MG tablet Take 1 tablet (10 mg total) by mouth daily. 06/11/23   Anders Simmonds, PA-C  atorvastatin (LIPITOR) 10 MG tablet Take 1 tablet (10 mg total) by mouth daily. 06/11/23   Anders Simmonds, PA-C  Fluticasone-Umeclidin-Vilant (TRELEGY ELLIPTA) 100-62.5-25 MCG/ACT AEPB Inhale 1 puff into the lungs daily. 06/11/23   Anders Simmonds, PA-C  hydrOXYzine (ATARAX) 25 MG tablet  Take 1 tablet (25 mg total) by mouth 3 (three) times daily as needed for anxiety or itching. 06/11/23   Anders Simmonds, PA-C  traZODone (DESYREL) 50 MG tablet Take 1 tablet (50 mg total) by mouth at bedtime as needed for sleep. 06/11/23   Anders Simmonds, PA-C    ROS: Neg HEENT Neg resp Neg cardiac Neg GI Neg GU Neg MS Neg psych Neg neuro  Objective:   Vitals:   06/11/23 1125 06/11/23 1212  BP: (!) 164/74 (!) 160/70  Pulse: 86   Resp: 16   SpO2: 96%   Weight: 119 lb (54 kg)   Height: 5\' 7"  (1.702 m)    Exam General appearance : Awake, alert, not in any distress. Speech Clear. Appears underweight and in poor health overall HEENT: Atraumatic and Normocephalic, pupils equally reactive to light and accomodation Neck: Supple, no JVD. No cervical lymphadenopathy.  Chest: fair but diminished air entry bilaterally.  Diffuse moderate wheezing CVS: S1 S2 regular, no murmurs.  Extremities: B/L Lower Ext shows no edema, both legs are warm to touch Neurology: Awake alert, and oriented X 3, CN II-XII intact, Non focal Skin: No Rash  Data Review Lab Results  Component Value Date   HGBA1C 5.0 05/16/2023   HGBA1C 4.6 (L) 10/25/2022   HGBA1C 5.6 08/20/2019    Assessment & Plan   1. Chronic obstructive  pulmonary disease, unspecified COPD type (HCC) Still with significant wheezing s/p covid - Fluticasone-Umeclidin-Vilant (TRELEGY ELLIPTA) 100-62.5-25 MCG/ACT AEPB; Inhale 1 puff into the lungs daily.  Dispense: 60 each; Refill: 3 - albuterol (VENTOLIN HFA) 108 (90 Base) MCG/ACT inhaler; Inhale 2 puffs into the lungs every 6 (six) hours as needed for wheezing or shortness of breath.  Dispense: 18 g; Refill: 1 - predniSONE (DELTASONE) 10 MG tablet; Take 1 tablet (10 mg total) by mouth daily with breakfast.  Dispense: 21 tablet; Refill: 0 Smoking cessation and patches sent   2. Substance induced mood disorder (HCC) - traZODone (DESYREL) 50 MG tablet; Take 1 tablet (50 mg total) by mouth at bedtime as needed for sleep.  Dispense: 30 tablet; Refill: 2  3. Anxiety and depression Stable with meds and off other substances - traZODone (DESYREL) 50 MG tablet; Take 1 tablet (50 mg total) by mouth at bedtime as needed for sleep.  Dispense: 30 tablet; Refill: 2 - hydrOXYzine (ATARAX) 25 MG tablet; Take 1 tablet (25 mg total) by mouth 3 (three) times daily as needed for anxiety or itching.  Dispense: 90 tablet; Refill: 2  4. ESRD needing dialysis (HCC) (Primary) I have encouraged her again about how dialysis could improve her health  5. Homelessness  6. Symptomatic anemia To ED if feels worse - CBC with Differential  7. Hospital discharge follow-up Left ama  8. High risk for readmission  9. Essential hypertension resume - amLODipine (NORVASC) 10 MG tablet; Take 1 tablet (10 mg total) by mouth daily.  Dispense: 30 tablet; Refill: 5  10. Other chronic pain - traMADol (ULTRAM) 50 MG tablet; Take 1 tablet (50 mg total) by mouth every 8 (eight) hours as needed for up to 5 days.  Dispense: 15 tablet; Refill: 0  After I was finished with the visit, the patient had the nurse look at her arm>  travia reported to me there was a "bed-bug imbedded in her arm."  I advised her to go to the ED for this in  addition to possible transfusion.  I do not have experience in a procedure like  this.  I had logged onto a meeting at this time.    Return in about 4 months (around 10/11/2023) for PCP for chronic conditions.  The patient was given clear instructions to go to ER or return to medical center if symptoms don't improve, worsen or new problems develop. The patient verbalized understanding. The patient was told to call to get lab results if they haven't heard anything in the next week.      Cynthia Co, PA-C [address] [facility] [address] [facility] [place], [place] [phone]   06/11/2023, 12:55 PM

## 2023-06-12 ENCOUNTER — Observation Stay (HOSPITAL_COMMUNITY)

## 2023-06-12 ENCOUNTER — Encounter: Payer: Self-pay | Admitting: Physician Assistant

## 2023-06-12 ENCOUNTER — Inpatient Hospital Stay (HOSPITAL_COMMUNITY)
Admission: EM | Admit: 2023-06-12 | Discharge: 2023-06-17 | DRG: 291 | Disposition: A | Discharge status: Home / Self Care | Attending: Internal Medicine | Admitting: Internal Medicine

## 2023-06-12 ENCOUNTER — Emergency Department (HOSPITAL_COMMUNITY)

## 2023-06-12 ENCOUNTER — Encounter (HOSPITAL_COMMUNITY): Payer: Self-pay

## 2023-06-12 ENCOUNTER — Other Ambulatory Visit: Payer: Self-pay

## 2023-06-12 DIAGNOSIS — R451 Restlessness and agitation: Secondary | ICD-10-CM | POA: Diagnosis present

## 2023-06-12 DIAGNOSIS — F1010 Alcohol abuse, uncomplicated: Secondary | ICD-10-CM | POA: Diagnosis present

## 2023-06-12 DIAGNOSIS — Z5941 Food insecurity: Secondary | ICD-10-CM

## 2023-06-12 DIAGNOSIS — J90 Pleural effusion, not elsewhere classified: Secondary | ICD-10-CM | POA: Diagnosis present

## 2023-06-12 DIAGNOSIS — Z604 Social exclusion and rejection: Secondary | ICD-10-CM | POA: Diagnosis present

## 2023-06-12 DIAGNOSIS — F1920 Other psychoactive substance dependence, uncomplicated: Secondary | ICD-10-CM | POA: Diagnosis present

## 2023-06-12 DIAGNOSIS — N186 End stage renal disease: Secondary | ICD-10-CM | POA: Diagnosis present

## 2023-06-12 DIAGNOSIS — J9621 Acute and chronic respiratory failure with hypoxia: Secondary | ICD-10-CM | POA: Diagnosis not present

## 2023-06-12 DIAGNOSIS — I132 Hypertensive heart and chronic kidney disease with heart failure and with stage 5 chronic kidney disease, or end stage renal disease: Secondary | ICD-10-CM | POA: Diagnosis not present

## 2023-06-12 DIAGNOSIS — Z5982 Transportation insecurity: Secondary | ICD-10-CM

## 2023-06-12 DIAGNOSIS — Z860100 Personal history of colon polyps, unspecified: Secondary | ICD-10-CM

## 2023-06-12 DIAGNOSIS — Z7951 Long term (current) use of inhaled steroids: Secondary | ICD-10-CM

## 2023-06-12 DIAGNOSIS — E8770 Fluid overload, unspecified: Secondary | ICD-10-CM | POA: Diagnosis not present

## 2023-06-12 DIAGNOSIS — Z765 Malingerer [conscious simulation]: Secondary | ICD-10-CM

## 2023-06-12 DIAGNOSIS — Z560 Unemployment, unspecified: Secondary | ICD-10-CM

## 2023-06-12 DIAGNOSIS — E1122 Type 2 diabetes mellitus with diabetic chronic kidney disease: Secondary | ICD-10-CM | POA: Diagnosis present

## 2023-06-12 DIAGNOSIS — Z833 Family history of diabetes mellitus: Secondary | ICD-10-CM

## 2023-06-12 DIAGNOSIS — Z888 Allergy status to other drugs, medicaments and biological substances status: Secondary | ICD-10-CM

## 2023-06-12 DIAGNOSIS — F1210 Cannabis abuse, uncomplicated: Secondary | ICD-10-CM | POA: Diagnosis present

## 2023-06-12 DIAGNOSIS — K2970 Gastritis, unspecified, without bleeding: Secondary | ICD-10-CM | POA: Diagnosis present

## 2023-06-12 DIAGNOSIS — L89152 Pressure ulcer of sacral region, stage 2: Secondary | ICD-10-CM | POA: Diagnosis present

## 2023-06-12 DIAGNOSIS — K2990 Gastroduodenitis, unspecified, without bleeding: Secondary | ICD-10-CM | POA: Diagnosis present

## 2023-06-12 DIAGNOSIS — E785 Hyperlipidemia, unspecified: Secondary | ICD-10-CM | POA: Diagnosis present

## 2023-06-12 DIAGNOSIS — N179 Acute kidney failure, unspecified: Secondary | ICD-10-CM

## 2023-06-12 DIAGNOSIS — K2289 Other specified disease of esophagus: Secondary | ICD-10-CM | POA: Diagnosis present

## 2023-06-12 DIAGNOSIS — G894 Chronic pain syndrome: Secondary | ICD-10-CM | POA: Diagnosis present

## 2023-06-12 DIAGNOSIS — Z5986 Financial insecurity: Secondary | ICD-10-CM

## 2023-06-12 DIAGNOSIS — K31819 Angiodysplasia of stomach and duodenum without bleeding: Secondary | ICD-10-CM

## 2023-06-12 DIAGNOSIS — J441 Chronic obstructive pulmonary disease with (acute) exacerbation: Secondary | ICD-10-CM

## 2023-06-12 DIAGNOSIS — B1920 Unspecified viral hepatitis C without hepatic coma: Secondary | ICD-10-CM | POA: Diagnosis present

## 2023-06-12 DIAGNOSIS — F1410 Cocaine abuse, uncomplicated: Secondary | ICD-10-CM | POA: Diagnosis present

## 2023-06-12 DIAGNOSIS — Z8711 Personal history of peptic ulcer disease: Secondary | ICD-10-CM

## 2023-06-12 DIAGNOSIS — J9601 Acute respiratory failure with hypoxia: Secondary | ICD-10-CM | POA: Diagnosis present

## 2023-06-12 DIAGNOSIS — K5520 Angiodysplasia of colon without hemorrhage: Secondary | ICD-10-CM

## 2023-06-12 DIAGNOSIS — L89322 Pressure ulcer of left buttock, stage 2: Secondary | ICD-10-CM | POA: Diagnosis present

## 2023-06-12 DIAGNOSIS — F419 Anxiety disorder, unspecified: Secondary | ICD-10-CM | POA: Diagnosis present

## 2023-06-12 DIAGNOSIS — D649 Anemia, unspecified: Secondary | ICD-10-CM | POA: Diagnosis not present

## 2023-06-12 DIAGNOSIS — I5032 Chronic diastolic (congestive) heart failure: Secondary | ICD-10-CM | POA: Diagnosis present

## 2023-06-12 DIAGNOSIS — Z66 Do not resuscitate: Secondary | ICD-10-CM | POA: Diagnosis present

## 2023-06-12 DIAGNOSIS — J449 Chronic obstructive pulmonary disease, unspecified: Secondary | ICD-10-CM | POA: Diagnosis present

## 2023-06-12 DIAGNOSIS — Z992 Dependence on renal dialysis: Secondary | ICD-10-CM

## 2023-06-12 DIAGNOSIS — Z5900 Homelessness unspecified: Secondary | ICD-10-CM

## 2023-06-12 DIAGNOSIS — F1910 Other psychoactive substance abuse, uncomplicated: Secondary | ICD-10-CM | POA: Diagnosis present

## 2023-06-12 DIAGNOSIS — R0602 Shortness of breath: Secondary | ICD-10-CM

## 2023-06-12 DIAGNOSIS — G8929 Other chronic pain: Secondary | ICD-10-CM

## 2023-06-12 DIAGNOSIS — D631 Anemia in chronic kidney disease: Secondary | ICD-10-CM | POA: Diagnosis present

## 2023-06-12 DIAGNOSIS — Z91411 Personal history of adult psychological abuse: Secondary | ICD-10-CM

## 2023-06-12 DIAGNOSIS — E872 Acidosis, unspecified: Secondary | ICD-10-CM | POA: Diagnosis present

## 2023-06-12 DIAGNOSIS — F17210 Nicotine dependence, cigarettes, uncomplicated: Secondary | ICD-10-CM | POA: Diagnosis present

## 2023-06-12 DIAGNOSIS — Z8616 Personal history of COVID-19: Secondary | ICD-10-CM

## 2023-06-12 DIAGNOSIS — Z79899 Other long term (current) drug therapy: Secondary | ICD-10-CM

## 2023-06-12 DIAGNOSIS — Z9981 Dependence on supplemental oxygen: Secondary | ICD-10-CM

## 2023-06-12 DIAGNOSIS — J4489 Other specified chronic obstructive pulmonary disease: Secondary | ICD-10-CM | POA: Diagnosis present

## 2023-06-12 DIAGNOSIS — K219 Gastro-esophageal reflux disease without esophagitis: Secondary | ICD-10-CM | POA: Diagnosis present

## 2023-06-12 DIAGNOSIS — Z91158 Patient's noncompliance with renal dialysis for other reason: Secondary | ICD-10-CM

## 2023-06-12 DIAGNOSIS — I10 Essential (primary) hypertension: Secondary | ICD-10-CM

## 2023-06-12 DIAGNOSIS — E875 Hyperkalemia: Secondary | ICD-10-CM | POA: Diagnosis present

## 2023-06-12 DIAGNOSIS — E8779 Other fluid overload: Secondary | ICD-10-CM | POA: Diagnosis present

## 2023-06-12 DIAGNOSIS — F32A Depression, unspecified: Secondary | ICD-10-CM | POA: Diagnosis present

## 2023-06-12 LAB — COMPREHENSIVE METABOLIC PANEL WITH GFR
ALT: 19 U/L (ref 0–44)
AST: 23 U/L (ref 15–41)
Albumin: 2.9 g/dL — ABNORMAL LOW (ref 3.5–5.0)
Alkaline Phosphatase: 93 U/L (ref 38–126)
Anion gap: 12 (ref 5–15)
BUN: 67 mg/dL — ABNORMAL HIGH (ref 8–23)
CO2: 15 mmol/L — ABNORMAL LOW (ref 22–32)
Calcium: 8.4 mg/dL — ABNORMAL LOW (ref 8.9–10.3)
Chloride: 108 mmol/L (ref 98–111)
Creatinine, Ser: 5.52 mg/dL — ABNORMAL HIGH (ref 0.44–1.00)
GFR, Estimated: 8 mL/min — ABNORMAL LOW (ref >60)
Glucose, Bld: 96 mg/dL (ref 70–99)
Potassium: 5 mmol/L (ref 3.5–5.1)
Sodium: 135 mmol/L (ref 135–145)
Total Bilirubin: 0.7 mg/dL (ref 0.0–1.2)
Total Protein: 6.9 g/dL (ref 6.5–8.1)

## 2023-06-12 LAB — TROPONIN I (HIGH SENSITIVITY)
Troponin I (High Sensitivity): 24 ng/L — ABNORMAL HIGH (ref <18)
Troponin I (High Sensitivity): 21 ng/L — ABNORMAL HIGH (ref <18)

## 2023-06-12 LAB — CBC
HCT: 24 % — ABNORMAL LOW (ref 36.0–46.0)
Hemoglobin: 7.5 g/dL — ABNORMAL LOW (ref 12.0–15.0)
MCH: 29.9 pg (ref 26.0–34.0)
MCHC: 31.3 g/dL (ref 30.0–36.0)
MCV: 95.6 fL (ref 80.0–100.0)
Platelets: 240 10*3/uL (ref 150–400)
RBC: 2.51 MIL/uL — ABNORMAL LOW (ref 3.87–5.11)
RDW: 22.4 % — ABNORMAL HIGH (ref 11.5–15.5)
WBC: 7.1 10*3/uL (ref 4.0–10.5)
nRBC: 0 % (ref 0.0–0.2)

## 2023-06-12 LAB — CBC WITH DIFFERENTIAL/PLATELET
Abs Immature Granulocytes: 0.06 10*3/uL (ref 0.00–0.07)
Basophils Absolute: 0 10*3/uL (ref 0.0–0.2)
Basophils Absolute: 0 10*3/uL (ref 0.0–0.1)
Basophils Relative: 1 %
Basos: 1 %
EOS (ABSOLUTE): 0.2 10*3/uL (ref 0.0–0.4)
Eos: 3 %
Eosinophils Absolute: 0.1 10*3/uL (ref 0.0–0.5)
Eosinophils Relative: 1 %
HCT: 20.4 % — ABNORMAL LOW (ref 36.0–46.0)
Hematocrit: 20.4 % — ABNORMAL LOW (ref 34.0–46.6)
Hemoglobin: 6.7 g/dL — CL (ref 11.1–15.9)
Hemoglobin: 6.4 g/dL — CL (ref 12.0–15.0)
Immature Grans (Abs): 0 10*3/uL (ref 0.0–0.1)
Immature Granulocytes: 1 %
Immature Granulocytes: 1 %
Lymphocytes Absolute: 1.1 10*3/uL (ref 0.7–3.1)
Lymphocytes Relative: 14 %
Lymphs Abs: 1 10*3/uL (ref 0.7–4.0)
Lymphs: 18 %
MCH: 31.6 pg (ref 26.6–33.0)
MCH: 31.8 pg (ref 26.0–34.0)
MCHC: 32.8 g/dL (ref 31.5–35.7)
MCHC: 31.4 g/dL (ref 30.0–36.0)
MCV: 96 fL (ref 79–97)
MCV: 101.5 fL — ABNORMAL HIGH (ref 80.0–100.0)
Monocytes Absolute: 0.7 10*3/uL (ref 0.1–0.9)
Monocytes Absolute: 0.7 10*3/uL (ref 0.1–1.0)
Monocytes Relative: 10 %
Monocytes: 11 %
Neutro Abs: 5.1 10*3/uL (ref 1.7–7.7)
Neutrophils Absolute: 4 10*3/uL (ref 1.4–7.0)
Neutrophils Relative %: 73 %
Neutrophils: 66 %
Platelets: 294 10*3/uL (ref 150–450)
Platelets: 281 10*3/uL (ref 150–400)
RBC: 2.12 x10E6/uL — CL (ref 3.77–5.28)
RBC: 2.01 MIL/uL — ABNORMAL LOW (ref 3.87–5.11)
RDW: 16.9 % — ABNORMAL HIGH (ref 11.7–15.4)
RDW: 20 % — ABNORMAL HIGH (ref 11.5–15.5)
WBC: 6 10*3/uL (ref 3.4–10.8)
WBC: 7 10*3/uL (ref 4.0–10.5)
nRBC: 0 % (ref 0.0–0.2)

## 2023-06-12 LAB — POC OCCULT BLOOD, ED: Fecal Occult Bld: POSITIVE — AB

## 2023-06-12 LAB — PREPARE RBC (CROSSMATCH)

## 2023-06-12 MED ORDER — SODIUM BICARBONATE 650 MG PO TABS
1300.0000 mg | ORAL_TABLET | Freq: Two times a day (BID) | ORAL | Status: DC
  Administered 2023-06-12 – 2023-06-13 (×3): 1300 mg via ORAL
  Filled 2023-06-12 (×3): qty 2

## 2023-06-12 MED ORDER — MORPHINE SULFATE (PF) 4 MG/ML IV SOLN
4.0000 mg | Freq: Once | INTRAVENOUS | Status: AC
  Administered 2023-06-12: 4 mg via INTRAVENOUS
  Filled 2023-06-12: qty 1

## 2023-06-12 MED ORDER — BUSPIRONE HCL 10 MG PO TABS: 10.0000 mg | ORAL_TABLET | Freq: Two times a day (BID) | ORAL | Status: DC

## 2023-06-12 MED ORDER — TRAZODONE HCL 50 MG PO TABS
50.0000 mg | ORAL_TABLET | Freq: Every evening | ORAL | Status: DC | PRN
  Administered 2023-06-14 – 2023-06-16 (×4): 50 mg via ORAL
  Filled 2023-06-12 (×4): qty 1

## 2023-06-12 MED ORDER — PANTOPRAZOLE SODIUM 40 MG IV SOLR
40.0000 mg | Freq: Once | INTRAVENOUS | Status: AC
  Administered 2023-06-12: 40 mg via INTRAVENOUS
  Filled 2023-06-12: qty 10

## 2023-06-12 MED ORDER — ONDANSETRON HCL 4 MG/2ML IJ SOLN
4.0000 mg | Freq: Once | INTRAMUSCULAR | Status: AC
  Administered 2023-06-12: 4 mg via INTRAVENOUS
  Filled 2023-06-12: qty 2

## 2023-06-12 MED ORDER — FLUTICASONE FUROATE-VILANTEROL 100-25 MCG/ACT IN AEPB
1.0000 | INHALATION_SPRAY | Freq: Every day | RESPIRATORY_TRACT | Status: DC
  Administered 2023-06-13 – 2023-06-17 (×5): 1 via RESPIRATORY_TRACT
  Filled 2023-06-12 (×2): qty 28

## 2023-06-12 MED ORDER — LORAZEPAM 2 MG/ML IJ SOLN
1.0000 mg | Freq: Once | INTRAMUSCULAR | Status: AC
  Administered 2023-06-12: 1 mg via INTRAVENOUS
  Filled 2023-06-12: qty 1

## 2023-06-12 MED ORDER — FUROSEMIDE 10 MG/ML IJ SOLN: 120.0000 mg | Freq: Once | INTRAMUSCULAR | Status: DC

## 2023-06-12 MED ORDER — FUROSEMIDE 10 MG/ML IJ SOLN
120.0000 mg | Freq: Once | INTRAVENOUS | Status: AC
  Administered 2023-06-12: 120 mg via INTRAVENOUS
  Filled 2023-06-12: qty 10

## 2023-06-12 MED ORDER — POLYETHYLENE GLYCOL 3350 17 G PO PACK: 17.0000 g | PACK | Freq: Every day | ORAL | Status: DC | PRN

## 2023-06-12 MED ORDER — ACETAMINOPHEN 650 MG RE SUPP: 650.0000 mg | Freq: Four times a day (QID) | RECTAL | Status: DC | PRN

## 2023-06-12 MED ORDER — PANTOPRAZOLE SODIUM 40 MG IV SOLR
40.0000 mg | Freq: Two times a day (BID) | INTRAVENOUS | Status: DC
  Administered 2023-06-12 – 2023-06-17 (×10): 40 mg via INTRAVENOUS
  Filled 2023-06-12 (×10): qty 10

## 2023-06-12 MED ORDER — ATORVASTATIN CALCIUM 10 MG PO TABS
10.0000 mg | ORAL_TABLET | Freq: Every day | ORAL | Status: DC
  Administered 2023-06-12 – 2023-06-17 (×6): 10 mg via ORAL
  Filled 2023-06-12 (×5): qty 1

## 2023-06-12 MED ORDER — TORSEMIDE 20 MG PO TABS
100.0000 mg | ORAL_TABLET | Freq: Every day | ORAL | Status: DC
  Administered 2023-06-13 – 2023-06-17 (×5): 100 mg via ORAL
  Filled 2023-06-12 (×5): qty 5

## 2023-06-12 MED ORDER — TORSEMIDE 20 MG PO TABS: 100.0000 mg | ORAL_TABLET | Freq: Every day | ORAL | Status: DC

## 2023-06-12 MED ORDER — PANTOPRAZOLE SODIUM 40 MG PO TBEC: 40.0000 mg | DELAYED_RELEASE_TABLET | Freq: Two times a day (BID) | ORAL | Status: DC

## 2023-06-12 MED ORDER — ACETAMINOPHEN 325 MG PO TABS
650.0000 mg | ORAL_TABLET | Freq: Four times a day (QID) | ORAL | Status: DC | PRN
  Administered 2023-06-13 – 2023-06-16 (×4): 650 mg via ORAL
  Filled 2023-06-12 (×4): qty 2

## 2023-06-12 MED ORDER — FUROSEMIDE 10 MG/ML IJ SOLN
160.0000 mg | Freq: Once | INTRAVENOUS | Status: AC
  Administered 2023-06-12: 160 mg via INTRAVENOUS
  Filled 2023-06-12: qty 16

## 2023-06-12 MED ORDER — AMLODIPINE BESYLATE 10 MG PO TABS
10.0000 mg | ORAL_TABLET | Freq: Every day | ORAL | Status: DC
  Administered 2023-06-13 – 2023-06-17 (×5): 10 mg via ORAL
  Filled 2023-06-12 (×5): qty 1

## 2023-06-12 MED ORDER — ALBUTEROL SULFATE (2.5 MG/3ML) 0.083% IN NEBU
INHALATION_SOLUTION | RESPIRATORY_TRACT | Status: AC
Start: 2023-06-12 — End: 2023-06-12
  Administered 2023-06-12: 5 mg
  Filled 2023-06-12: qty 6

## 2023-06-12 MED ORDER — SODIUM CHLORIDE 0.9% IV SOLUTION: Freq: Once | INTRAVENOUS | Status: AC

## 2023-06-12 MED ORDER — SODIUM CHLORIDE 0.9% FLUSH
3.0000 mL | Freq: Two times a day (BID) | INTRAVENOUS | Status: DC
Start: 2023-06-12 — End: 2023-06-17
  Administered 2023-06-12 – 2023-06-17 (×9): 3 mL via INTRAVENOUS

## 2023-06-12 MED ORDER — UMECLIDINIUM BROMIDE 62.5 MCG/ACT IN AEPB
1.0000 | INHALATION_SPRAY | Freq: Every day | RESPIRATORY_TRACT | Status: DC
  Administered 2023-06-13 – 2023-06-17 (×5): 1 via RESPIRATORY_TRACT
  Filled 2023-06-12: qty 7

## 2023-06-12 NOTE — H&P (Addendum)
 History and Physical   KAEDYN BELARDO UKG:[id number] DOB: September 26, 1960 DOA: 06/12/2023  PCP: [name], MD   Patient coming from: Home  Chief Complaint: Shortness of breath, anemia  HPI: Carrol Hougland Desrosier is a 63 y.o. female with medical history significant of hypertension, hyperlipidemia, GERD, PUD, gastritis, GI bleed, hepatitis C, pleural effusion, substance use, anxiety, depression, COPD, anemia, chronic pain, ESRD with refusal of HD presenting with shortness of breath.  Patient recently admitted 2/20-2/25 with GI bleed and progression to ESRD but patient declined HD during that visit and was given Lasix as needed.  Patient readmitted 2/28-3/2.  Admitted to critical care in the ICU due to significant oxygen requirement/hypoxia in the setting of volume overload.  At that time she was agreeable to 2 sessions of dialysis and was weaning down on her oxygen but ultimately left AMA.  She presents again after being seen by her PCP and found to be anemic.  She was seen for shortness of breath and concern for worsening hemoglobin and her hemoglobin was down to 6.7 which is a 2 g drop in 3 weeks with hemoglobin of 8.93 weeks ago.  Patient has some chronic dark stools due to being on iron as well as some chronic intermittent abdominal pain.  On going dark stools. Denies fevers, chills, chest pain, constipation, nausea, vomiting.  ED Course: Vital signs in the ED notable for blood pressure in the 150s to 180s systolic.  Requiring 4 to 5 L to maintain saturations which is increased to baseline 3 L.  Lab workup included CMP with bicarb 15, BUN 67, creatinine 5.52, calcium 8.4, albumin 2.9.  CBC with hemoglobin of 6.4 down from 6.7 yesterday and 8.93 weeks ago.  Troponin 24 with repeat pending.  Type and screen ordered in the ED.  Chest x-ray showed probable minimal basilar edema and mild right pleural effusion increased to previous.  Patient received morphine and Zofran in the ED.  EDP to obtain FOBT  given her recent GI bleeding and 2g drop in hemoglobin in the past 3 weeks.  Patient told EP that she would except dialysis in an emergency.  Review of Systems: As per HPI otherwise all other systems reviewed and are negative.  Past Medical History:  Diagnosis Date   Acute on chronic blood loss anemia 08/01/2022   Acute on chronic diastolic CHF (congestive heart failure) (HCC) 08/12/2022   Acute on chronic respiratory failure with hypoxia (HCC) 05/16/2023   Acute pulmonary edema (HCC) 05/16/2023   Acute seasonal allergic rhinitis due to pollen 02/22/2016   Alcohol abuse    Allergy    Anxiety    Arteriovenous fistula infection, initial encounter (HCC) 04/16/2023   Arthritis    Asthma    Cannabis abuse    Cocaine abuse (HCC)    COPD (chronic obstructive pulmonary disease) (HCC)    COPD with acute exacerbation (HCC) 10/25/2022   COVID 05/16/2023   Depression    Drug addiction (HCC)    GERD (gastroesophageal reflux disease)    GIB (gastrointestinal bleeding) 08/19/2019   Heart murmur    Heme positive stool 08/14/2022   Homelessness    Hyperlipidemia    Hypertension    pt stated "every once in a while BP will be high but has not been prescribed medication for HTN.    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) (HCC) 02/22/2016   SIRS (systemic inflammatory response syndrome) (HCC) 10/25/2022    Past Surgical History:  Procedure Laterality Date   A/V FISTULAGRAM Right 01/31/2023   Procedure: A/V Fistulagram;  Surgeon: Cephus Shelling, MD;  Location: [facility] INVASIVE CV LAB;  Service: Cardiovascular;  Laterality: Right;   AV FISTULA PLACEMENT Right 08/14/2022   Procedure: RIGHT ARM BRACHIOBASILIC ATERIOVENOUS FISTULA  CREATION;  Surgeon: Leonie Douglas, MD;  Location: [facility] OR;  Service: Vascular;  Laterality: Right;   BASCILIC VEIN TRANSPOSITION Right 11/13/2022   Procedure: RIGHT ARM SECOND STAGE BASILIC VEIN TRANSPOSITION;  Surgeon: Leonie Douglas, MD;  Location: [facility] OR;  Service: Vascular;  Laterality: Right;   BIOPSY  01/01/2019   Procedure: BIOPSY;  Surgeon: Beverley Fiedler, MD;  Location: [facility] ENDOSCOPY;  Service: Endoscopy;;   BIOPSY  11/17/2021   Procedure: BIOPSY;  Surgeon: Lynann Bologna, MD;  Location: [facility] ENDOSCOPY;  Service: Gastroenterology;;   CESAREAN SECTION  1989   COLONOSCOPY  11/07/2020   2018   CYSTOSCOPY W/ URETERAL STENT PLACEMENT Left 08/01/2018   Procedure: CYSTOSCOPY WITH RETROGRADE PYELOGRAM/URETERAL STENT PLACEMENT;  Surgeon: Crista Elliot, MD;  Location: [facility] ORS;  Service: Urology;  Laterality: Left;   CYSTOSCOPY WITH RETROGRADE PYELOGRAM, URETEROSCOPY AND STENT PLACEMENT Left 01/15/2019   Procedure: CYSTOSCOPY WITH RETROGRADE PYELOGRAM, URETEROSCOPY AND STENT PLACEMENT;  Surgeon: Crista Elliot, MD;  Location: [facility] ORS;  Service: Urology;  Laterality: Left;   ENTEROSCOPY N/A 08/16/2022   Procedure: ENTEROSCOPY;  Surgeon: Meridee Score Netty Starring., MD;  Location: [facility] ENDOSCOPY;  Service: Gastroenterology;  Laterality: N/A;   ENTEROSCOPY N/A 10/31/2022   Procedure: ENTEROSCOPY;  Surgeon: Beverley Fiedler, MD;  Location: [facility] ENDOSCOPY;  Service: Gastroenterology;  Laterality: N/A;   ENTEROSCOPY N/A 05/09/2023   Procedure: ENTEROSCOPY;  Surgeon: [name], MD;  Location: [facility] ENDOSCOPY;  Service: Gastroenterology;  Laterality: N/A;   ESOPHAGOGASTRODUODENOSCOPY (EGD) WITH PROPOFOL N/A 01/01/2019   Procedure: ESOPHAGOGASTRODUODENOSCOPY (EGD) WITH PROPOFOL;  Surgeon: Beverley Fiedler, MD;  Location: [facility] ENDOSCOPY;  Service: Endoscopy;  Laterality: N/A;   ESOPHAGOGASTRODUODENOSCOPY (EGD) WITH PROPOFOL N/A 08/21/2019   Procedure: ESOPHAGOGASTRODUODENOSCOPY (EGD) WITH PROPOFOL;  Surgeon: Napoleon Form, MD;  Location: [facility] ENDOSCOPY;  Service: Endoscopy;  Laterality: N/A;   ESOPHAGOGASTRODUODENOSCOPY (EGD) WITH PROPOFOL N/A 11/17/2021   Procedure: ESOPHAGOGASTRODUODENOSCOPY (EGD) WITH PROPOFOL;  Surgeon: Lynann Bologna, MD;  Location: [facility] ENDOSCOPY;  Service: Gastroenterology;  Laterality: N/A;   FRACTURE SURGERY Left 2011   arm   HEMOSTASIS CLIP PLACEMENT  08/16/2022   Procedure: HEMOSTASIS CLIP PLACEMENT;  Surgeon: Lemar Lofty., MD;  Location: [facility] ENDOSCOPY;  Service: Gastroenterology;;   HOT HEMOSTASIS N/A 08/16/2022   Procedure: HOT HEMOSTASIS (ARGON PLASMA COAGULATION/BICAP);  Surgeon: Lemar Lofty., MD;  Location: [facility] ENDOSCOPY;  Service: Gastroenterology;  Laterality: N/A;   HOT HEMOSTASIS N/A 10/31/2022   Procedure: HOT HEMOSTASIS (ARGON PLASMA COAGULATION/BICAP);  Surgeon: Beverley Fiedler, MD;  Location: [facility] ENDOSCOPY;  Service: Gastroenterology;  Laterality: N/A;   HOT HEMOSTASIS N/A 05/09/2023   Procedure: HOT HEMOSTASIS (ARGON PLASMA COAGULATION/BICAP);  Surgeon: [name], MD;  Location: [facility] ENDOSCOPY;  Service: Gastroenterology;  Laterality: N/A;   INSERTION OF DIALYSIS CATHETER Right 08/14/2022   Procedure: INSERTION OF TUNNELED DIALYSIS CATHETER USING PALINDROME CATHETER KIT 19CM;  Surgeon: Leonie Douglas, MD;  Location: [facility] OR;  Service: Vascular;  Laterality:  Right;   POLYPECTOMY  08/16/2022   Procedure: POLYPECTOMY;  Surgeon: Mansouraty, Netty Starring., MD;  Location: [facility] ENDOSCOPY;  Service: Gastroenterology;;   SUBMUCOSAL TATTOO INJECTION  08/16/2022   Procedure: SUBMUCOSAL TATTOO INJECTION;  Surgeon: Lemar Lofty., MD;  Location: [facility] ENDOSCOPY;  Service: Gastroenterology;;   UPPER GASTROINTESTINAL ENDOSCOPY      Social History  reports that she has been smoking cigarettes. She has been exposed to tobacco smoke. She has never used smokeless tobacco. She reports that she does not currently use alcohol. She reports current drug use. Drug:  Marijuana.  Allergies  Allergen Reactions   Zestril [Lisinopril] Other (See Comments)    Hyperkalemia    Family History  Problem Relation Age of Onset   Diabetes Father    Colon cancer Neg Hx    Esophageal cancer Neg Hx    Rectal cancer Neg Hx    Stomach cancer Neg Hx   Reviewed on admission  Prior to Admission medications   Medication Sig Start Date End Date Taking? Authorizing Provider  albuterol (VENTOLIN HFA) 108 (90 Base) MCG/ACT inhaler Inhale 2 puffs into the lungs every 6 (six) hours as needed for wheezing or shortness of breath. 06/11/23  Yes McClung, Angela M, PA-C  amLODipine (NORVASC) 10 MG tablet Take 1 tablet (10 mg total) by mouth daily. 06/11/23  Yes Georgian Co M, PA-C  atorvastatin (LIPITOR) 10 MG tablet Take 1 tablet (10 mg total) by mouth daily. 06/11/23  Yes McClung, Marzella Schlein, PA-C  busPIRone (BUSPAR) 10 MG tablet Take 1 tablet (10 mg total) by mouth 2 (two) times daily. 02/10/23  Yes Anders Simmonds, PA-C  ferrous sulfate 325 (65 FE) MG tablet Take 1 tablet (325 mg total) by mouth daily with breakfast. 02/01/23  Yes Ghimire, Werner Lean, MD  Fluticasone-Umeclidin-Vilant (TRELEGY ELLIPTA) 100-62.5-25 MCG/ACT AEPB Inhale 1 puff into the lungs daily. 06/11/23  Yes Anders Simmonds, PA-C  gabapentin (NEURONTIN) 100 MG capsule Take 1 capsule (100 mg total) by mouth 3 (three) times daily. 02/01/23  Yes Ghimire, Werner Lean, MD  hydrOXYzine (ATARAX) 25 MG tablet Take 1 tablet (25 mg total) by mouth 3 (three) times daily as needed for anxiety or itching. 06/11/23  Yes Georgian Co M, PA-C  pantoprazole (PROTONIX) 40 MG tablet Take 1 tablet (40 mg total) by mouth 2 (two) times daily. 05/13/23 08/11/23 Yes Elgergawy, Leana Roe, MD  sodium bicarbonate 650 MG tablet Take 2 tablets (1,300 mg total) by mouth 2 (two) times daily. 05/13/23  Yes Elgergawy, Leana Roe, MD  torsemide (DEMADEX) 100 MG tablet Take 1 tablet (100 mg total) by mouth daily. 05/13/23  Yes Elgergawy, Leana Roe, MD   traMADol (ULTRAM) 50 MG tablet Take 1 tablet (50 mg total) by mouth every 8 (eight) hours as needed for up to 5 days. Patient taking differently: Take 50 mg by mouth every 8 (eight) hours as needed for moderate pain (pain score 4-6) or severe pain (pain score 7-10). 06/11/23 06/16/23 Yes McClung, Marzella Schlein, PA-C  traZODone (DESYREL) 50 MG tablet Take 1 tablet (50 mg total) by mouth at bedtime as needed for sleep. 06/11/23  Yes McClung, Angela M, PA-C  ipratropium-albuterol (DUONEB) 0.5-2.5 (3) MG/3ML SOLN Take 3 mLs by nebulization every 6 (six) hours as needed (shortness of breath and wheezing). Patient not taking: Reported on 06/12/2023 05/01/23   [name], MD  multivitamin (RENA-VIT) TABS tablet Take 1 tablet by mouth at bedtime. Patient not taking: Reported on 06/12/2023 02/01/23  Ghimire, Werner Lean, MD  nicotine (NICODERM CQ - DOSED IN MG/24 HOURS) 21 mg/24hr patch Place 1 patch (21 mg total) onto the skin daily. Patient not taking: Reported on 06/12/2023 06/11/23   Anders Simmonds, PA-C  predniSONE (DELTASONE) 10 MG tablet Take 1 tablet (10 mg total) by mouth daily with breakfast. Patient not taking: Reported on 06/12/2023 06/11/23   Anders Simmonds, [place]    Physical Exam: Vitals:   06/12/23 1049 06/12/23 1107 06/12/23 1130 06/12/23 1200  BP:   (!) 156/68 (!) 169/70  Pulse:  90 81 86  Resp:  20 (!) 21 18  Temp:      SpO2:  100% 100% 99%  Weight: 54.4 kg     Height: 5\' 7"  (1.702 m)       Physical Exam Constitutional:      General: She is not in acute distress.    Appearance: Normal appearance.  HENT:     Head: Normocephalic and atraumatic.     Mouth/Throat:     Mouth: Mucous membranes are moist.     Pharynx: Oropharynx is clear.  Eyes:     Extraocular Movements: Extraocular movements intact.     Pupils: Pupils are equal, round, and reactive to light.  Cardiovascular:     Rate and Rhythm: Normal rate and regular rhythm.     Pulses: Normal pulses.     Heart sounds:  Normal heart sounds.  Pulmonary:     Effort: Pulmonary effort is normal. No respiratory distress.     Breath sounds: Normal breath sounds.  Abdominal:     General: Bowel sounds are normal. There is no distension.     Palpations: Abdomen is soft.     Tenderness: There is no abdominal tenderness.  Musculoskeletal:        General: No swelling or deformity.  Skin:    General: Skin is warm and dry.  Neurological:     General: No focal deficit present.     Mental Status: Mental status is at baseline.    Labs on Admission: I have personally reviewed following labs and imaging studies  CBC: Recent Labs  Lab 06/11/23 1218 06/12/23 1108  WBC 6.0 7.0  NEUTROABS 4.0 5.1  HGB 6.7* 6.4*  HCT 20.4* 20.4*  MCV 96 101.5*  PLT 294 281    Basic Metabolic Panel: Recent Labs  Lab 06/12/23 1104  NA 135  K 5.0  CL 108  CO2 15*  GLUCOSE 96  BUN 67*  CREATININE 5.52*  CALCIUM 8.4*    GFR: Estimated Creatinine Clearance: 9.1 mL/min (A) (by C-G formula based on SCr of 5.52 mg/dL (H)).  Liver Function Tests: Recent Labs  Lab 06/12/23 1104  AST 23  ALT 19  ALKPHOS 93  BILITOT 0.7  PROT 6.9  ALBUMIN 2.9*    Urine analysis:    Component Value Date/Time   COLORURINE YELLOW 04/16/2023 0200   APPEARANCEUR HAZY (A) 04/16/2023 0200   LABSPEC 1.009 04/16/2023 0200   PHURINE 6.0 04/16/2023 0200   GLUCOSEU 50 (A) 04/16/2023 0200   HGBUR SMALL (A) 04/16/2023 0200   BILIRUBINUR NEGATIVE 04/16/2023 0200   KETONESUR NEGATIVE 04/16/2023 0200   PROTEINUR >=300 (A) 04/16/2023 0200   UROBILINOGEN 0.2 03/02/2010 2027   NITRITE NEGATIVE 04/16/2023 0200   LEUKOCYTESUR MODERATE (A) 04/16/2023 0200    Radiological Exams on Admission: DG Chest 2 View Result Date: 06/12/2023 CLINICAL DATA:  Shortness of breath. EXAM: CHEST - 2 VIEW COMPARISON:  May 16, 2023. FINDINGS:  Stable cardiomediastinal silhouette. Mild right pleural effusion which is increased compared to prior exam. There  appears to be associated right basilar atelectasis. Minimal interstitial densities are noted in both lung bases concerning for edema. Bony thorax is unremarkable. IMPRESSION: Probable minimal bibasilar pulmonary edema. Mild right pleural effusion is noted which is slightly enlarged compared to prior exam with associated subsegmental atelectasis. Electronically Signed   By: Lupita Raider M.D.   On: 06/12/2023 12:32   EKG: Independently reviewed.  Sinus rhythm at 91 bpm.  Nonspecific T wave changes.  Assessment/Plan Active Problems:   Acute on chronic anemia   Volume overload   Hepatitis C   Essential hypertension   Hyperlipidemia   Anxiety and depression   GERD (gastroesophageal reflux disease)   Polysubstance abuse (HCC)   Gastritis and gastroduodenitis   Pleural effusion   COPD (chronic obstructive pulmonary disease) (HCC)   Chronic pain syndrome   Angiodysplasia of upper gastrointestinal tract   ESRD (end stage renal disease) (HCC)   Acute on chronic respiratory failure with hypoxia (HCC)   Acute on chronic respiratory failure with hypoxia Acute on chronic anemia History of GI bleed Pleural effusion Volume overload > Patient presenting with worsening shortness of breath and increasing oxygen requirement.  On no o2 at baseline per patient, requiring 4 to 5 L here.. > Found to have worsening anemia in PCP office.  Hemoglobin currently 6.4 down from 8.93 weeks ago. > Also with some degree of volume overload and small pleural effusion in the setting of ESRD not on HD. > Have requested EP get FOBT and patient will need GI consult if positive given her history. > Will proceed with 1 unit PRBC transfusion following dose of IV Lasix today. > Most of her symptoms are likely due to her anemia with minimal volume overload at this time though volume overload is not helping and we will have to look out for TACO. - Monitor and progress overnight - 1 unit PRBC transfusion - Lasix 120 mg IV x  1 - Continue with supplemental oxygen, wean as tolerated - Check FOBT, GI consult of positive - Liquid diet for now - IV PPI x 1 ADDENDUM > FOBT positive - Message sent to GI for consult in the morning - Continue PPI IV ADDENDUM 2 > Patient continued to report worsening shortness of breath despite no hypoxia noted on monitor. > Oxygen level increased to 8 L on a mask.  Continues to have good saturations. > Patient was ordered some Ativan to help with anxiety component and an x-ray which appears to show some persistent/likely worsened edema. -Will give additional dose of IV Lasix 160 mg x 1 -Will make BiPAP available as needed this evening if any worsening in her respiratory status - Patient now n.p.o. based on GI recommendations and possible need for BiPAP.  ESRD > Not on HD.  Declined it during admission for GI bleed but accepted it x 2 during admission for volume overload and hypoxia in the past month. > Will need ongoing discussions about this inconsistency, likely best held with her primary nephrology team and possibly palliative care involvement - Single dose of Lasix as above - Continue home torsemide tomorrow - Continue home bicarb, consider IV if bicarb worsens but hold off on additional fluids for now with transfusion - Trend renal function and electrolytes  Hypertension - Lasix as above - Resume torsemide tomorrow - Resume amlodipine tomorrow  Hyperlipidemia - Continue home atorvastatin  GERD PUD Gastritis - Working  up for GI bleed as above - Continue home PPI, IV dose x 1.  Substance use - Noted  Anxiety Depression - Continue home BuSpar and trazodone  COPD - Replace home Trelegy with formulary Breo and Incruse - As needed albuterol  Chronic pain - Treat as needed  DVT prophylaxis: SCDs Code Status:   DNR, pre arrest intervention desired. Family Communication:  None on admission  Disposition Plan:   Patient is from:  Home  Anticipated DC  to:  Home  Anticipated DC date:  1 to 5 days  Anticipated DC barriers: None  Consults called:  None at this time Admission status:  Observation, progressive  Severity of Illness: The appropriate patient status for this patient is OBSERVATION. Observation status is judged to be reasonable and necessary in order to provide the required intensity of service to ensure the patient's safety. The patient's presenting symptoms, physical exam findings, and initial radiographic and laboratory data in the context of their medical condition is felt to place them at decreased risk for further clinical deterioration. Furthermore, it is anticipated that the patient will be medically stable for discharge from the hospital within 2 midnights of admission.    Synetta Fail MD [facility]  How to contact the [facility] Attending or Consulting provider 7A - 7P or covering provider during after hours 7P -7A, for this patient?   Check the care team in [facility] and look for a) attending/consulting [facility] provider listed and b) the [facility] team listed Log into [email or web address] and use [place]'s universal password to access. If you do not have the password, please contact the hospital operator. Locate the [facility] provider you are looking for under [facility] and page to a number that you can be directly reached. If you still have difficulty reaching the provider, please page the [facility] (Director on Call) for the Hospitalists listed on amion for assistance.  06/12/2023, 2:35 PM

## 2023-06-12 NOTE — Progress Notes (Signed)
 RT called to patient room due to patient potentially needing bipap.  Upon arrival to patient's room, patient was noted to have shallow respirations.  Per RN, patient here for SOB especially with exertion.  Auscultated patient and patient noted to be clear throughout although patient stated that she was wheezing.  Patient given 5mg  Albuterol.  Will continue to monitor.

## 2023-06-12 NOTE — ED Triage Notes (Signed)
 Pt here for [facility]. Hx of COPD. C/O chest pain. 75% on room air.

## 2023-06-12 NOTE — ED Notes (Signed)
 Pt requesting mask for O2... Pt was talking a lot during the time this paramedic was sitting with her for blood administration and became more short of breath. Pt placed on NRB mask and O2 increased to 8L. Pt tolerating blood well and resting comfortably at this time.

## 2023-06-12 NOTE — ED Notes (Signed)
 Patient transported to admitted to floor via stretcher. All belongings sent with patient. Patient without questions at time of transportation.

## 2023-06-12 NOTE — ED Notes (Signed)
 Pt transported to Xray.

## 2023-06-12 NOTE — ED Notes (Signed)
 Pt c/o increasing sob and not being able to breath. Pt O2 sat stable at 97-98 % on 8L. Admitting MD notified and orders placed. Pt crying saying that she "can't take it anymore"

## 2023-06-12 NOTE — ED Provider Notes (Signed)
 [name] EMERGENCY DEPARTMENT AT [facility] Provider Note   CSN: [id number] Arrival date & time: 06/12/23  1034     History  Chief Complaint  Patient presents with   Shortness of Breath    [name] is a 63 y.o. female.  Patient with h/o polysubstance abuse, COPD, HTN, untreated HCV, peptic ulcer disease, esophagitis, CKD5 refusing routine dialysis, GI bleed --presents to the emergency department for evaluation of shortness of breath.  Patient was seen by PCP yesterday and there was concern that her hemoglobin was dropping again.  She has needed blood transfusion in the past.  Patient reports worsening chest pain, worse left chest, during the day yesterday and worsening last night.  She also reports chronic abdominal pain that is an on and off problem for her.  Her legs have not become particularly swollen recently.       Home Medications Prior to Admission medications   Medication Sig Start Date End Date Taking? Authorizing Provider  albuterol (VENTOLIN HFA) 108 (90 Base) MCG/ACT inhaler Inhale 2 puffs into the lungs every 6 (six) hours as needed for wheezing or shortness of breath. 06/11/23   Anders Simmonds, PA-C  amLODipine (NORVASC) 10 MG tablet Take 1 tablet (10 mg total) by mouth daily. 06/11/23   Anders Simmonds, PA-C  atorvastatin (LIPITOR) 10 MG tablet Take 1 tablet (10 mg total) by mouth daily. 06/11/23   Anders Simmonds, PA-C  busPIRone (BUSPAR) 10 MG tablet Take 1 tablet (10 mg total) by mouth 2 (two) times daily. 02/10/23   Anders Simmonds, PA-C  ferrous sulfate 325 (65 FE) MG tablet Take 1 tablet (325 mg total) by mouth daily with breakfast. 02/01/23   Ghimire, Werner Lean, MD  Fluticasone-Umeclidin-Vilant (TRELEGY ELLIPTA) 100-62.5-25 MCG/ACT AEPB Inhale 1 puff into the lungs daily. 06/11/23   Anders Simmonds, PA-C  gabapentin (NEURONTIN) 100 MG capsule Take 1 capsule (100 mg total) by mouth 3 (three) times daily. 02/01/23   Ghimire, Werner Lean, MD   HYDROcodone-acetaminophen (NORCO/VICODIN) 5-325 MG tablet Take 1 tablet by mouth every 6 (six) hours as needed for severe pain (pain score 7-10). 05/13/23   Elgergawy, Leana Roe, MD  hydrOXYzine (ATARAX) 25 MG tablet Take 1 tablet (25 mg total) by mouth 3 (three) times daily as needed for anxiety or itching. 06/11/23   Anders Simmonds, PA-C  ipratropium-albuterol (DUONEB) 0.5-2.5 (3) MG/3ML SOLN Take 3 mLs by nebulization every 6 (six) hours as needed (shortness of breath and wheezing). 05/01/23   [name], MD  methylPREDNISolone (MEDROL DOSEPAK) 4 MG TBPK tablet follow package directions 05/13/23   Elgergawy, Leana Roe, MD  multivitamin (RENA-VIT) TABS tablet Take 1 tablet by mouth at bedtime. 02/01/23   Ghimire, Werner Lean, MD  nicotine (NICODERM CQ - DOSED IN MG/24 HOURS) 21 mg/24hr patch Place 1 patch (21 mg total) onto the skin daily. 06/11/23   Anders Simmonds, PA-C  pantoprazole (PROTONIX) 40 MG tablet Take 1 tablet (40 mg total) by mouth 2 (two) times daily. 05/13/23 08/11/23  Elgergawy, Leana Roe, MD  predniSONE (DELTASONE) 10 MG tablet Take 1 tablet (10 mg total) by mouth daily with breakfast. 06/11/23   Anders Simmonds, PA-C  sodium bicarbonate 650 MG tablet Take 2 tablets (1,300 mg total) by mouth 2 (two) times daily. 05/13/23   Elgergawy, Leana Roe, MD  torsemide (DEMADEX) 100 MG tablet Take 1 tablet (100 mg total) by mouth daily. 05/13/23   Elgergawy, Leana Roe, MD  traMADol (ULTRAM) 50 MG tablet Take 1 tablet (50 mg total) by mouth every 8 (eight) hours as needed for up to 5 days. 06/11/23 06/16/23  Anders Simmonds, PA-C  traZODone (DESYREL) 50 MG tablet Take 1 tablet (50 mg total) by mouth at bedtime as needed for sleep. 06/11/23   Anders Simmonds, PA-C      Allergies    Zestril [lisinopril]    Review of Systems   Review of Systems  Physical Exam Updated Vital Signs BP (!) 186/73 (BP Location: Left Arm)   Pulse 94   Temp 98 F (36.7 C)   Resp (!) 24   Ht 5\' 7"  (1.702 m)    Wt 54.4 kg   SpO2 99%   BMI 18.79 kg/m  Physical Exam Vitals and nursing note reviewed. Exam conducted with a chaperone present.  Constitutional:      General: She is not in acute distress.    Appearance: She is well-developed.  HENT:     Head: Normocephalic and atraumatic.     Right Ear: External ear normal.     Left Ear: External ear normal.     Nose: Nose normal.  Eyes:     Conjunctiva/sclera: Conjunctivae normal.  Cardiovascular:     Rate and Rhythm: Normal rate and regular rhythm.     Heart sounds: No murmur heard. Pulmonary:     Effort: No respiratory distress.     Breath sounds: No wheezing, rhonchi or rales.     Comments: Patient on albuterol nebulizer treatment at time of my exam Abdominal:     Palpations: Abdomen is soft.     Tenderness: There is abdominal tenderness. There is no guarding or rebound.     Comments: Mild generalized tenderness without rebound or guarding, nonfocal exam  Genitourinary:    Rectum: No tenderness.     Comments: Dark brown stool, not melanotic and no red blood Musculoskeletal:     Cervical back: Normal range of motion and neck supple.     Right lower leg: No edema.     Left lower leg: No edema.  Skin:    General: Skin is warm and dry.     Findings: No rash.  Neurological:     General: No focal deficit present.     Mental Status: She is alert. Mental status is at baseline.     Motor: No weakness.  Psychiatric:        Mood and Affect: Mood normal.     ED Results / Procedures / Treatments   Labs (all labs ordered are listed, but only abnormal results are displayed) Labs Reviewed  CBC WITH DIFFERENTIAL/PLATELET - Abnormal; Notable for the following components:      Result Value   RBC 2.01 (*)    Hemoglobin 6.4 (*)    HCT 20.4 (*)    MCV 101.5 (*)    RDW 20.0 (*)    All other components within normal limits  COMPREHENSIVE METABOLIC PANEL WITH GFR - Abnormal; Notable for the following components:   CO2 15 (*)    BUN 67 (*)     Creatinine, Ser 5.52 (*)    Calcium 8.4 (*)    Albumin 2.9 (*)    GFR, Estimated 8 (*)    All other components within normal limits  TROPONIN I (HIGH SENSITIVITY) - Abnormal; Notable for the following components:   Troponin I (High Sensitivity) 24 (*)    All other components within normal limits  TROPONIN I (HIGH SENSITIVITY) -  Abnormal; Notable for the following components:   Troponin I (High Sensitivity) 21 (*)    All other components within normal limits  POC OCCULT BLOOD, ED  TYPE AND SCREEN  PREPARE RBC (CROSSMATCH)    ED ECG REPORT   Date: 06/12/2023  Rate: 91  Rhythm: normal sinus rhythm  QRS Axis: normal  Intervals: normal  ST/T Wave abnormalities: nonspecific ST/T changes  Conduction Disutrbances:none  Narrative Interpretation:   Old EKG Reviewed: unchanged  I have personally reviewed the EKG tracing and agree with the computerized printout as noted.   Radiology DG Chest 2 View Result Date: 06/12/2023 CLINICAL DATA:  Shortness of breath. EXAM: CHEST - 2 VIEW COMPARISON:  May 16, 2023. FINDINGS: Stable cardiomediastinal silhouette. Mild right pleural effusion which is increased compared to prior exam. There appears to be associated right basilar atelectasis. Minimal interstitial densities are noted in both lung bases concerning for edema. Bony thorax is unremarkable. IMPRESSION: Probable minimal bibasilar pulmonary edema. Mild right pleural effusion is noted which is slightly enlarged compared to prior exam with associated subsegmental atelectasis. Electronically Signed   By: Lupita Raider M.D.   On: 06/12/2023 12:32    Procedures Procedures    Medications Ordered in ED Medications  albuterol (PROVENTIL) (2.5 MG/3ML) 0.083% nebulizer solution (5 mg  Given 06/12/23 1057)    ED Course/ Medical Decision Making/ A&P    Patient seen and examined. History obtained directly from patient.  Reviewed previous hospital admission notes, PCP note from yesterday,  CBC performed yesterday with hemoglobin of 6.7.  Labs/EKG: Ordered CBC, CMP, troponin, type and screen.  Imaging: Ordered chest x-ray.  Medications/Fluids: Albuterol ordered by per triage protocol  Most recent vital signs reviewed and are as follows: BP (!) 186/73 (BP Location: Left Arm)   Pulse 94   Temp 98 F (36.7 C)   Resp (!) 24   Ht 5\' 7"  (1.702 m)   Wt 54.4 kg   SpO2 99%   BMI 18.79 kg/m   Initial impression: Shortness of breath in patient with multiple comorbidities.  She also complains of chest pain and abdominal pain.  She will need additional workup given her untreated ESRD, prior to receiving blood transfusion.  Symptoms are more extensive than what I would expect from anemia alone due to CKD.  1:54 PM Reassessment performed. Patient appears stable, comfortable after receiving pain medication.  Labs personally reviewed and interpreted including: CBC with white blood cell count normal, hemoglobin 6.4, macrocytic; CMP with elevated creatinine above baseline of 5.5 with a BUN of 67, potassium 5.0, mildly low bicarb at 15 likely in part due to uremia; troponin mildly elevated 24 likely related to underlying chronic kidney disease.  Imaging personally visualized and interpreted including: Chest x-ray with question of mild edema with an enlarged effusion compared to previous, agree with findings.  Reviewed pertinent lab work and imaging with patient at bedside. Questions answered.   Most current vital signs reviewed and are as follows: BP (!) 169/70   Pulse 86   Temp 98 F (36.7 C)   Resp 18   Ht 5\' 7"  (1.702 m)   Wt 54.4 kg   SpO2 99%   BMI 18.79 kg/m   Plan: Will require admission to hospital due to recurrent anemia and will need blood transfusion, also likely element of fluid overload due to end-stage kidney disease not on dialysis.  Currently patient is stable, on 4-5 L nasal cannula.  2:36 PM I have spoken with hospitalist Dr. Alinda Money.  He will see patient for  admission.  I performed Hemoccult and this is currently pending.  Patient with dark stool, dark brown, not melanotic or bloody.                                 Medical Decision Making Amount and/or Complexity of Data Reviewed Labs: ordered. Radiology: ordered.  Risk Prescription drug management. Decision regarding hospitalization.   Admit for anemia, fluid overload.         Final Clinical Impression(s) / ED Diagnoses Final diagnoses:  Anemia, unspecified type  Acute renal failure superimposed on stage 5 chronic kidney disease, not on chronic dialysis, unspecified acute renal failure type [place])  Shortness of breath    Rx / DC Orders ED Discharge Orders     None         Renne Crigler, PA-C 06/12/23 1439    Derwood Kaplan, MD 06/16/23 [phone]

## 2023-06-12 NOTE — ED Notes (Signed)
 PT placed on bedpan. After patient was finished and putting her pants back on, there was a vape in the bed.

## 2023-06-12 NOTE — ED Notes (Signed)
 Pt calmer after ativan but was reaching up into the air and talking out loud. Pt redirected and calm. Admitting MD at bedside

## 2023-06-12 NOTE — ED Notes (Signed)
 Respiratory at bedside.

## 2023-06-13 DIAGNOSIS — N186 End stage renal disease: Secondary | ICD-10-CM | POA: Diagnosis present

## 2023-06-13 DIAGNOSIS — K5520 Angiodysplasia of colon without hemorrhage: Secondary | ICD-10-CM

## 2023-06-13 DIAGNOSIS — Z5900 Homelessness unspecified: Secondary | ICD-10-CM | POA: Diagnosis not present

## 2023-06-13 DIAGNOSIS — N179 Acute kidney failure, unspecified: Secondary | ICD-10-CM | POA: Diagnosis present

## 2023-06-13 DIAGNOSIS — E872 Acidosis, unspecified: Secondary | ICD-10-CM | POA: Diagnosis present

## 2023-06-13 DIAGNOSIS — J4489 Other specified chronic obstructive pulmonary disease: Secondary | ICD-10-CM | POA: Diagnosis present

## 2023-06-13 DIAGNOSIS — R0602 Shortness of breath: Secondary | ICD-10-CM

## 2023-06-13 DIAGNOSIS — E1122 Type 2 diabetes mellitus with diabetic chronic kidney disease: Secondary | ICD-10-CM | POA: Diagnosis present

## 2023-06-13 DIAGNOSIS — F32A Depression, unspecified: Secondary | ICD-10-CM | POA: Diagnosis present

## 2023-06-13 DIAGNOSIS — L89152 Pressure ulcer of sacral region, stage 2: Secondary | ICD-10-CM | POA: Diagnosis present

## 2023-06-13 DIAGNOSIS — F1210 Cannabis abuse, uncomplicated: Secondary | ICD-10-CM | POA: Diagnosis present

## 2023-06-13 DIAGNOSIS — I10 Essential (primary) hypertension: Secondary | ICD-10-CM | POA: Diagnosis not present

## 2023-06-13 DIAGNOSIS — R195 Other fecal abnormalities: Secondary | ICD-10-CM

## 2023-06-13 DIAGNOSIS — I5032 Chronic diastolic (congestive) heart failure: Secondary | ICD-10-CM | POA: Diagnosis present

## 2023-06-13 DIAGNOSIS — K31819 Angiodysplasia of stomach and duodenum without bleeding: Secondary | ICD-10-CM

## 2023-06-13 DIAGNOSIS — J9621 Acute and chronic respiratory failure with hypoxia: Secondary | ICD-10-CM | POA: Diagnosis present

## 2023-06-13 DIAGNOSIS — N185 Chronic kidney disease, stage 5: Secondary | ICD-10-CM | POA: Diagnosis not present

## 2023-06-13 DIAGNOSIS — Z992 Dependence on renal dialysis: Secondary | ICD-10-CM | POA: Diagnosis not present

## 2023-06-13 DIAGNOSIS — Z8616 Personal history of COVID-19: Secondary | ICD-10-CM | POA: Diagnosis not present

## 2023-06-13 DIAGNOSIS — F1410 Cocaine abuse, uncomplicated: Secondary | ICD-10-CM | POA: Diagnosis present

## 2023-06-13 DIAGNOSIS — F1010 Alcohol abuse, uncomplicated: Secondary | ICD-10-CM | POA: Diagnosis present

## 2023-06-13 DIAGNOSIS — F1920 Other psychoactive substance dependence, uncomplicated: Secondary | ICD-10-CM | POA: Diagnosis present

## 2023-06-13 DIAGNOSIS — D631 Anemia in chronic kidney disease: Secondary | ICD-10-CM | POA: Diagnosis present

## 2023-06-13 DIAGNOSIS — K2990 Gastroduodenitis, unspecified, without bleeding: Secondary | ICD-10-CM | POA: Diagnosis present

## 2023-06-13 DIAGNOSIS — D649 Anemia, unspecified: Secondary | ICD-10-CM | POA: Diagnosis not present

## 2023-06-13 DIAGNOSIS — Z66 Do not resuscitate: Secondary | ICD-10-CM | POA: Diagnosis present

## 2023-06-13 DIAGNOSIS — B1920 Unspecified viral hepatitis C without hepatic coma: Secondary | ICD-10-CM | POA: Diagnosis present

## 2023-06-13 DIAGNOSIS — Z9981 Dependence on supplemental oxygen: Secondary | ICD-10-CM | POA: Diagnosis not present

## 2023-06-13 DIAGNOSIS — I132 Hypertensive heart and chronic kidney disease with heart failure and with stage 5 chronic kidney disease, or end stage renal disease: Secondary | ICD-10-CM | POA: Diagnosis present

## 2023-06-13 DIAGNOSIS — L89322 Pressure ulcer of left buttock, stage 2: Secondary | ICD-10-CM | POA: Diagnosis present

## 2023-06-13 LAB — CBC
HCT: 23.6 % — ABNORMAL LOW (ref 36.0–46.0)
HCT: 22.9 % — ABNORMAL LOW (ref 36.0–46.0)
Hemoglobin: 7.4 g/dL — ABNORMAL LOW (ref 12.0–15.0)
Hemoglobin: 7.4 g/dL — ABNORMAL LOW (ref 12.0–15.0)
MCH: 30 pg (ref 26.0–34.0)
MCH: 30.2 pg (ref 26.0–34.0)
MCHC: 31.4 g/dL (ref 30.0–36.0)
MCHC: 32.3 g/dL (ref 30.0–36.0)
MCV: 95.5 fL (ref 80.0–100.0)
MCV: 93.5 fL (ref 80.0–100.0)
Platelets: 229 10*3/uL (ref 150–400)
Platelets: 251 10*3/uL (ref 150–400)
RBC: 2.47 MIL/uL — ABNORMAL LOW (ref 3.87–5.11)
RBC: 2.45 MIL/uL — ABNORMAL LOW (ref 3.87–5.11)
RDW: 22.9 % — ABNORMAL HIGH (ref 11.5–15.5)
RDW: 22.8 % — ABNORMAL HIGH (ref 11.5–15.5)
WBC: 6.1 10*3/uL (ref 4.0–10.5)
WBC: 7.3 10*3/uL (ref 4.0–10.5)
nRBC: 0 % (ref 0.0–0.2)
nRBC: 0 % (ref 0.0–0.2)

## 2023-06-13 LAB — FOLATE: Folate: 30.3 ng/mL (ref >5.9)

## 2023-06-13 LAB — COMPREHENSIVE METABOLIC PANEL WITH GFR
ALT: 15 U/L (ref 0–44)
AST: 16 U/L (ref 15–41)
Albumin: 2.4 g/dL — ABNORMAL LOW (ref 3.5–5.0)
Alkaline Phosphatase: 79 U/L (ref 38–126)
Anion gap: 16 — ABNORMAL HIGH (ref 5–15)
BUN: 70 mg/dL — ABNORMAL HIGH (ref 8–23)
CO2: 13 mmol/L — ABNORMAL LOW (ref 22–32)
Calcium: 8.2 mg/dL — ABNORMAL LOW (ref 8.9–10.3)
Chloride: 107 mmol/L (ref 98–111)
Creatinine, Ser: 5.73 mg/dL — ABNORMAL HIGH (ref 0.44–1.00)
GFR, Estimated: 8 mL/min — ABNORMAL LOW (ref >60)
Glucose, Bld: 88 mg/dL (ref 70–99)
Potassium: 5.7 mmol/L — ABNORMAL HIGH (ref 3.5–5.1)
Sodium: 136 mmol/L (ref 135–145)
Total Bilirubin: 0.5 mg/dL (ref 0.0–1.2)
Total Protein: 6 g/dL — ABNORMAL LOW (ref 6.5–8.1)

## 2023-06-13 LAB — RETICULOCYTES
Immature Retic Fract: 17.4 % — ABNORMAL HIGH (ref 2.3–15.9)
RBC.: 2.22 MIL/uL — ABNORMAL LOW (ref 3.87–5.11)
Retic Count, Absolute: 47.1 10*3/uL (ref 19.0–186.0)
Retic Ct Pct: 2.1 % (ref 0.4–3.1)

## 2023-06-13 LAB — VITAMIN B12: Vitamin B-12: 514 pg/mL (ref 180–914)

## 2023-06-13 LAB — HEPATITIS B SURFACE ANTIGEN: Hepatitis B Surface Ag: NONREACTIVE

## 2023-06-13 LAB — IRON AND TIBC
Iron: 22 ug/dL — ABNORMAL LOW (ref 28–170)
Saturation Ratios: 10 % — ABNORMAL LOW (ref 10.4–31.8)
TIBC: 217 ug/dL — ABNORMAL LOW (ref 250–450)
UIBC: 195 ug/dL

## 2023-06-13 LAB — FERRITIN: Ferritin: 451 ng/mL — ABNORMAL HIGH (ref 11–307)

## 2023-06-13 MED ORDER — CHLORHEXIDINE GLUCONATE CLOTH 2 % EX PADS
6.0000 | MEDICATED_PAD | Freq: Every day | CUTANEOUS | Status: DC
  Administered 2023-06-14 – 2023-06-17 (×4): 6 via TOPICAL

## 2023-06-13 MED ORDER — ANTICOAGULANT SODIUM CITRATE 4% (200MG/5ML) IV SOLN: 5.0000 mL | Status: DC | PRN

## 2023-06-13 MED ORDER — HEPARIN SODIUM (PORCINE) 1000 UNIT/ML DIALYSIS: 1000.0000 [IU] | INTRAMUSCULAR | Status: DC | PRN

## 2023-06-13 MED ORDER — FERROUS SULFATE 325 (65 FE) MG PO TABS
325.0000 mg | ORAL_TABLET | Freq: Two times a day (BID) | ORAL | Status: DC
  Administered 2023-06-13 – 2023-06-17 (×6): 325 mg via ORAL
  Filled 2023-06-13 (×6): qty 1

## 2023-06-13 MED ORDER — NICOTINE 21 MG/24HR TD PT24
21.0000 mg | MEDICATED_PATCH | Freq: Every day | TRANSDERMAL | Status: DC
  Administered 2023-06-13 – 2023-06-17 (×5): 21 mg via TRANSDERMAL
  Filled 2023-06-13 (×5): qty 1

## 2023-06-13 MED ORDER — FERROUS SULFATE 325 (65 FE) MG PO TABS
325.0000 mg | ORAL_TABLET | Freq: Every day | ORAL | Status: DC
  Administered 2023-06-13: 325 mg via ORAL
  Filled 2023-06-13: qty 1

## 2023-06-13 MED ORDER — PENTAFLUOROPROP-TETRAFLUOROETH EX AERO: 1.0000 | INHALATION_SPRAY | CUTANEOUS | Status: DC | PRN

## 2023-06-13 MED ORDER — LIDOCAINE HCL (PF) 1 % IJ SOLN: 5.0000 mL | INTRAMUSCULAR | Status: DC | PRN

## 2023-06-13 MED ORDER — FOLIC ACID 1 MG PO TABS
1.0000 mg | ORAL_TABLET | Freq: Every day | ORAL | Status: DC
  Administered 2023-06-13 – 2023-06-17 (×5): 1 mg via ORAL
  Filled 2023-06-13 (×5): qty 1

## 2023-06-13 MED ORDER — LIDOCAINE-PRILOCAINE 2.5-2.5 % EX CREA: 1.0000 | TOPICAL_CREAM | CUTANEOUS | Status: DC | PRN

## 2023-06-13 MED ORDER — IRON SUCROSE 200 MG IVPB - SIMPLE MED
200.0000 mg | Status: AC
  Administered 2023-06-13 – 2023-06-15 (×3): 200 mg via INTRAVENOUS
  Filled 2023-06-13 (×2): qty 110
  Filled 2023-06-13: qty 200

## 2023-06-13 MED ORDER — DARBEPOETIN ALFA 100 MCG/0.5ML IJ SOSY
100.0000 ug | PREFILLED_SYRINGE | INTRAMUSCULAR | Status: DC
  Administered 2023-06-13: 100 ug via SUBCUTANEOUS
  Filled 2023-06-13: qty 0.5

## 2023-06-13 MED ORDER — ALTEPLASE 2 MG IJ SOLR: 2.0000 mg | Freq: Once | INTRAMUSCULAR | Status: DC | PRN

## 2023-06-13 MED ORDER — TRAMADOL HCL 50 MG PO TABS
50.0000 mg | ORAL_TABLET | Freq: Two times a day (BID) | ORAL | Status: DC | PRN
  Administered 2023-06-13 – 2023-06-17 (×8): 50 mg via ORAL
  Filled 2023-06-13 (×9): qty 1

## 2023-06-13 MED ORDER — HYDRALAZINE HCL 20 MG/ML IJ SOLN
10.0000 mg | Freq: Four times a day (QID) | INTRAMUSCULAR | Status: DC | PRN
  Administered 2023-06-16: 10 mg via INTRAVENOUS
  Filled 2023-06-13: qty 1
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
## 2023-06-13 NOTE — Progress Notes (Signed)
 PT Cancellation Note  Patient Details Name: [name] MRN: [id number] DOB: 05-02-1960   Cancelled Treatment:    Reason Eval/Treat Not Completed: Other (comment);Pain limiting ability to participate; attempted to see pt this am.  Was frustrated with not getting to eat, stated her stomach hurt and she was needing pain meds.  RN made aware.  Will attempt again later as able.    [name] 06/13/2023, 12:44 PM [name], PT Acute Rehabilitation Services [phone] 06/13/2023

## 2023-06-13 NOTE — Progress Notes (Signed)
 Transport got to the unit to pickup pt to take to dialysis. Pt refused to have dialysis at this time. Explained the importance of dialysis and still refused it.

## 2023-06-13 NOTE — Progress Notes (Signed)
 Hemodialysis scheduled per Dr Arrie Aran. Report was received from floor RN. Transportation was dispatched to retrieve the patient. Patient did not arrive to Hemodialysis within a reasonable time frame. Writer reviewed notes for any changes to patient status. Note was reviewed and was discovered that the patient has refused hemodialysis. Reviewed information was confirmed with the floor RN. Dr Arrie Aran has been informed and is aware of the patient's decision.-no new order given

## 2023-06-13 NOTE — Progress Notes (Signed)
 Writer responded to call bell with request to get eyeglasses out of her purse. When handed her purse she proceeded to take out home medications. Writer then educated patient on policy/protocol for proper medication administration. Home medications were then collected and placed in secure bag then taken to pharmacy for storage. Receipt of delivery place in patient paper chart.

## 2023-06-13 NOTE — Progress Notes (Signed)
 PROGRESS NOTE                                                                                                                                                                                                             Patient Demographics:    [name], is a 63 y.o. female, DOB - May 27, 1960, WUJ:[id number]  Outpatient Primary MD for the patient is [name], MD    LOS - 0  Admit date - 06/12/2023    Chief Complaint  Patient presents with   Shortness of Breath       Brief Narrative (HPI from H&P)   63 y.o. female with medical history significant of hypertension, hyperlipidemia, GERD, PUD, gastritis, GI bleed, hepatitis C, pleural effusion, substance use, anxiety, depression, COPD, anemia, chronic pain, ESRD with refusal of HD presenting with shortness of breath.   Patient recently admitted 2/20-2/25 with GI bleed and progression to ESRD but patient declined HD during that visit and was given Lasix as needed.  Patient readmitted 2/28-3/2.  Admitted to critical care in the ICU due to significant oxygen requirement/hypoxia in the setting of volume overload.  At that time she was agreeable to 2 sessions of dialysis and was weaning down on her oxygen but ultimately left AMA.   She presents again after being seen by her PCP and found to be anemic.  She was seen for shortness of breath and concern for worsening hemoglobin and her hemoglobin was down to 6.7 which is a 2 g drop in 3 weeks with hemoglobin of 8.93 weeks ago.  Patient has some chronic dark stools due to being on iron as well as some chronic intermittent abdominal pain.  He was also found to have fluid overload and worsening renal function.  She was admitted for further care.    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  Both GI and nephrology been consulted, no signs of ongoing brisk bleeding,  on PPI, for now IV diuretics, patient agreeable for HD now, will defer this to nephrology.  Continue supplemental oxygen for respiratory failure, once fluid is removed I think this will improve.   ESRD  Due to financial issues, homeless situation and transportation issues she has been declining HD in the past, currently she is agreeable to HD treatment nephrology consulted see above.   Hypertension - diuretics per nephrology, Norvasc.  Monitor   Hyperlipidemia  - Continue home atorvastatin   GERD PUD  Gastritis  - does have anemia Hemoccult positive, no need for transfusion continue monitor CBC, iron and folic acid supplementation, PPI, GI on board EGD once respiratory status improves and fluid is removed via HD.   Substance use  - Noted, counseled to quit.   Anxiety Depression  - Continue home BuSpar and trazodone   COPD  - Replace home Trelegy with formulary Breo and Incruse  As needed albuterol   Chronic pain - Treat as needed         Condition -   Guarded  Family Communication  :   None  Code Status :  DNR  Consults  :  GI< Renal  PUD Prophylaxis :     Procedures  :      EGD      Disposition Plan  :    Status is: Observation   DVT Prophylaxis  :    SCDs Start: 06/12/23 1422    Lab Results  Component Value Date   PLT 229 06/13/2023    Diet :  Diet Order             Diet NPO time specified Except for: Ice Chips, Sips with Meds  Diet effective now                    Inpatient Medications  Scheduled Meds:  amLODipine  10 mg Oral Daily   atorvastatin  10 mg Oral Daily   ferrous sulfate  325 mg Oral BID WC   fluticasone furoate-vilanterol  1  puff Inhalation Daily   And   umeclidinium bromide  1 puff Inhalation Daily   folic acid  1 mg Oral Daily   pantoprazole (PROTONIX) IV  40 mg Intravenous Q12H   sodium bicarbonate  1,300 mg Oral BID   sodium chloride flush  3 mL Intravenous Q12H   torsemide  100 mg Oral Daily   Continuous Infusions: PRN Meds:.acetaminophen **OR** acetaminophen, polyethylene glycol, traZODone  Antibiotics  :    Anti-infectives (From admission, onward)    None         Objective:   Vitals:   06/12/23 2030 06/12/23 2303 06/13/23 0335 06/13/23 1002  BP: (!) 171/84 (!) 162/70 (!) 157/80 135/76  Pulse: 95 98 96   Resp: 16 20 19    Temp:  97.8 F (36.6 C) 98.2 F (36.8 C)   TempSrc:  Oral Oral   SpO2: 100% 94% 92%   Weight:      Height:        Wt Readings from Last 3 Encounters:  06/12/23 54.4 kg  06/11/23 54 kg  05/17/23 59.9 kg     Intake/Output Summary (Last 24 hours) at 06/13/2023 1027 Last data filed at 06/13/2023 0300 Gross per 24 hour  Intake 513.67 ml  Output --  Net 513.67 ml     Physical Exam  Awake Alert, No new F.N deficits, Normal affect [place].AT,PERRAL Supple Neck, No JVD,   Symmetrical Chest wall movement, Mod air  movement bilaterally, bibasilar crackles RRR,No Gallops,Rubs or new Murmurs,  +ve B.Sounds, Abd Soft, No tenderness,   No Cyanosis, Clubbing or edema     RN pressure injury documentation: Pressure Injury 11/10/22 Coccyx Mid Stage 2 -  Partial thickness loss of dermis presenting as a shallow open injury with a red, pink wound bed without slough. (Active)  11/10/22 0205  Location: Coccyx  Location Orientation: Mid  Staging: Stage 2 -  Partial thickness loss of dermis presenting as a shallow open injury with a red, pink wound bed without slough.  Wound Description (Comments):   Present on Admission: Yes     Pressure Injury 11/10/22 Buttocks Left Stage 2 -  Partial thickness loss of dermis presenting as a shallow open injury with a red, pink wound bed  without slough. (Active)  11/10/22 0205  Location: Buttocks  Location Orientation: Left  Staging: Stage 2 -  Partial thickness loss of dermis presenting as a shallow open injury with a red, pink wound bed without slough.  Wound Description (Comments):   Present on Admission: Yes     Pressure Injury 05/16/23 Buttocks Left Stage 2 -  Partial thickness loss of dermis presenting as a shallow open injury with a red, pink wound bed without slough. (Active)  05/16/23 1600  Location: Buttocks  Location Orientation: Left  Staging: Stage 2 -  Partial thickness loss of dermis presenting as a shallow open injury with a red, pink wound bed without slough.  Wound Description (Comments):   Present on Admission: Yes      Data Review:    Recent Labs  Lab 06/11/23 1218 06/12/23 1108 06/12/23 2231 06/13/23 0526  WBC 6.0 7.0 7.1 6.1  HGB 6.7* 6.4* 7.5* 7.4*  HCT 20.4* 20.4* 24.0* 23.6*  PLT 294 281 240 229  MCV 96 101.5* 95.6 95.5  MCH 31.6 31.8 29.9 30.0  MCHC 32.8 31.4 31.3 31.4  RDW 16.9* 20.0* 22.4* 22.9*  LYMPHSABS 1.1 1.0  --   --   MONOABS  --  0.7  --   --   EOSABS 0.2 0.1  --   --   BASOSABS 0.0 0.0  --   --     Recent Labs  Lab 06/12/23 1104 06/13/23 0526  NA 135 136  K 5.0 5.7*  CL 108 107  CO2 15* 13*  ANIONGAP 12 16*  GLUCOSE 96 88  BUN 67* 70*  CREATININE 5.52* 5.73*  AST 23 16  ALT 19 15  ALKPHOS 93 79  BILITOT 0.7 0.5  ALBUMIN 2.9* 2.4*  CALCIUM 8.4* 8.2*      Recent Labs  Lab 06/12/23 1104 06/13/23 0526  CALCIUM 8.4* 8.2*    --------------------------------------------------------------------------------------------------------------- Lab Results  Component Value Date   CHOL 135 10/25/2022   HDL 43 10/25/2022   LDLCALC 70 10/25/2022   TRIG 111 10/25/2022   CHOLHDL 3.1 10/25/2022    Lab Results  Component Value Date   HGBA1C 5.0 05/16/2023   No results for input(s): "TSH", "T4TOTAL", "FREET4", "T3FREE", "THYROIDAB" in the last 72  hours. Recent Labs    06/13/23 0802  VITAMINB12 514  FERRITIN 451*  TIBC 217*  IRON 22*  RETICCTPCT 2.1   ------------------------------------------------------------------------------------------------------------------ Cardiac Enzymes No results for input(s): "CKMB", "TROPONINI", "MYOGLOBIN" in the last 168 hours.  Invalid input(s): "CK"  Micro Results No results found for this or any previous visit (from the past 240 hours).  Radiology Report DG CHEST PORT 1 VIEW Result Date: 06/12/2023 CLINICAL DATA:  Dyspnea.  Respiratory distress. EXAM: PORTABLE CHEST 1 VIEW COMPARISON:  06/12/2023 FINDINGS: Cardiac enlargement. Small bilateral pleural effusions, greater on the right. Increasing interstitial and airspace infiltrates in both lungs since prior study. This may represent progressing edema or pneumonia superimposed on chronic fibrosis. Mediastinal contours appear intact. IMPRESSION: Cardiac enlargement. Pleural effusions, greater on the right. Increasing infiltrates in the lungs. Electronically Signed   By: Burman Nieves M.D.   On: 06/12/2023 19:44   DG Chest 2 View Result Date: 06/12/2023 CLINICAL DATA:  Shortness of breath. EXAM: CHEST - 2 VIEW COMPARISON:  May 16, 2023. FINDINGS: Stable cardiomediastinal silhouette. Mild right pleural effusion which is increased compared to prior exam. There appears to be associated right basilar atelectasis. Minimal interstitial densities are noted in both lung bases concerning for edema. Bony thorax is unremarkable. IMPRESSION: Probable minimal bibasilar pulmonary edema. Mild right pleural effusion is noted which is slightly enlarged compared to prior exam with associated subsegmental atelectasis. Electronically Signed   By: Lupita Raider M.D.   On: 06/12/2023 12:32     Signature  -   Susa Raring M.D on 06/13/2023 at 10:27 AM   -  To page go to [email or web address]

## 2023-06-13 NOTE — Consult Note (Signed)
 [facility] Nephrology Consultation Note  Requesting MD: Dr. Thedore Mins, Bess Harvest Reason for consult: ESRD  HPI:  [name] is a 63 y.o. female with past medical history significant for hypertension, HLD, hep C, pleural effusion, polysubstance abuse, anxiety depression, COPD, ESRD and refusing dialysis, anemia presented with worsening shortness of breath, seen as a consultation for the management of ESRD. Patient was recently admitted in 04/2023 with GI bleed and progression of ESRD when she was seen by our team.  The patient declined dialysis at that time as well.  On admission, the hemoglobin was 6.7 for which she received blood transfusion.  The creatinine level mostly ranging around 5-6 at baseline.  She was seen by multiple nephrologist in the past recommending dialysis however patient has not been refusing.  She reports being homeless close and not wanting dialysis at all.  She has AV fistula maturing.  Today the labs showed potassium level of 5.7, CO2 13, BUN 70, creatinine level 5.73, albumin 2.4, iron saturation 10%.  The chest x-ray with a pleural effusion more on the right side and cardiac enlargement. The patient is complaining of generalized body pain and shortness of breath associated with a cough. She was very agitated and frequently upset while talking about kidney and dialysis.  After Klimaszewski discussion she is willing to try dialysis.  She is asking for a spray on AV fistula before cannulation in order to minimize pain.  PMHx:   Past Medical History:  Diagnosis Date   Acute on chronic blood loss anemia 08/01/2022   Acute on chronic diastolic CHF (congestive heart failure) (HCC) 08/12/2022   Acute on chronic respiratory failure with hypoxia (HCC) 05/16/2023   Acute pulmonary edema (HCC) 05/16/2023   Acute seasonal allergic rhinitis due to pollen 02/22/2016   Alcohol abuse    Allergy    Anxiety    Arteriovenous fistula infection, initial encounter (HCC) 04/16/2023    Arthritis    Asthma    Cannabis abuse    Cocaine abuse (HCC)    COPD (chronic obstructive pulmonary disease) (HCC)    COPD with acute exacerbation (HCC) 10/25/2022   COVID 05/16/2023   Depression    Drug addiction (HCC)    GERD (gastroesophageal reflux disease)    GIB (gastrointestinal bleeding) 08/19/2019   Heart murmur    Heme positive stool 08/14/2022   Homelessness    Hyperlipidemia    Hypertension    pt stated "every once in a while BP will be high but has not been prescribed medication for HTN.    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) 02/22/2016   SIRS (systemic inflammatory response syndrome) (HCC) 10/25/2022    Past Surgical History:  Procedure Laterality Date   A/V FISTULAGRAM Right 01/31/2023   Procedure: A/V Fistulagram;  Surgeon: Cephus Shelling, MD;  Location: [facility] INVASIVE CV LAB;  Service: Cardiovascular;  Laterality: Right;   AV FISTULA PLACEMENT Right 08/14/2022   Procedure: RIGHT ARM BRACHIOBASILIC ATERIOVENOUS FISTULA CREATION;  Surgeon: Leonie Douglas, MD;  Location: [facility] OR;  Service: Vascular;  Laterality: Right;   BASCILIC VEIN TRANSPOSITION Right 11/13/2022   Procedure: RIGHT ARM SECOND STAGE BASILIC VEIN TRANSPOSITION;  Surgeon: Leonie Douglas, MD;  Location: [facility]  OR;  Service: Vascular;  Laterality: Right;   BIOPSY  01/01/2019   Procedure: BIOPSY;  Surgeon: Beverley Fiedler, MD;  Location: [facility] ENDOSCOPY;  Service: Endoscopy;;   BIOPSY  11/17/2021   Procedure: BIOPSY;  Surgeon: Lynann Bologna, MD;  Location: [facility] ENDOSCOPY;  Service: Gastroenterology;;   CESAREAN SECTION  1989   COLONOSCOPY  11/07/2020   2018   CYSTOSCOPY W/ URETERAL STENT PLACEMENT Left 08/01/2018   Procedure: CYSTOSCOPY  WITH RETROGRADE PYELOGRAM/URETERAL STENT PLACEMENT;  Surgeon: Crista Elliot, MD;  Location: [facility] ORS;  Service: Urology;  Laterality: Left;   CYSTOSCOPY WITH RETROGRADE PYELOGRAM, URETEROSCOPY AND STENT PLACEMENT Left 01/15/2019   Procedure: CYSTOSCOPY WITH RETROGRADE PYELOGRAM, URETEROSCOPY AND STENT PLACEMENT;  Surgeon: Crista Elliot, MD;  Location: [facility] ORS;  Service: Urology;  Laterality: Left;   ENTEROSCOPY N/A 08/16/2022   Procedure: ENTEROSCOPY;  Surgeon: Meridee Score Netty Starring., MD;  Location: [facility] ENDOSCOPY;  Service: Gastroenterology;  Laterality: N/A;   ENTEROSCOPY N/A 10/31/2022   Procedure: ENTEROSCOPY;  Surgeon: Beverley Fiedler, MD;  Location: [facility] ENDOSCOPY;  Service: Gastroenterology;  Laterality: N/A;   ENTEROSCOPY N/A 05/09/2023   Procedure: ENTEROSCOPY;  Surgeon: [name], MD;  Location: [facility] ENDOSCOPY;  Service: Gastroenterology;  Laterality: N/A;   ESOPHAGOGASTRODUODENOSCOPY (EGD) WITH PROPOFOL N/A 01/01/2019   Procedure: ESOPHAGOGASTRODUODENOSCOPY (EGD) WITH PROPOFOL;  Surgeon: Beverley Fiedler, MD;  Location: [facility] ENDOSCOPY;  Service: Endoscopy;  Laterality: N/A;   ESOPHAGOGASTRODUODENOSCOPY (EGD) WITH PROPOFOL N/A 08/21/2019   Procedure: ESOPHAGOGASTRODUODENOSCOPY (EGD) WITH PROPOFOL;  Surgeon: Napoleon Form, MD;  Location: [facility] ENDOSCOPY;  Service: Endoscopy;  Laterality: N/A;   ESOPHAGOGASTRODUODENOSCOPY (EGD) WITH PROPOFOL N/A 11/17/2021   Procedure: ESOPHAGOGASTRODUODENOSCOPY (EGD) WITH PROPOFOL;  Surgeon: Lynann Bologna, MD;  Location: [facility] ENDOSCOPY;  Service: Gastroenterology;  Laterality: N/A;   FRACTURE SURGERY Left 2011   arm   HEMOSTASIS CLIP PLACEMENT  08/16/2022   Procedure: HEMOSTASIS CLIP PLACEMENT;  Surgeon: Lemar Lofty., MD;  Location: [facility] ENDOSCOPY;  Service: Gastroenterology;;   HOT HEMOSTASIS N/A 08/16/2022   Procedure: HOT HEMOSTASIS (ARGON PLASMA COAGULATION/BICAP);  Surgeon: Lemar Lofty., MD;  Location: [facility] ENDOSCOPY;  Service:  Gastroenterology;  Laterality: N/A;   HOT HEMOSTASIS N/A 10/31/2022   Procedure: HOT HEMOSTASIS (ARGON PLASMA COAGULATION/BICAP);  Surgeon: Beverley Fiedler, MD;  Location: [facility] ENDOSCOPY;  Service: Gastroenterology;  Laterality: N/A;   HOT HEMOSTASIS N/A 05/09/2023   Procedure: HOT HEMOSTASIS (ARGON PLASMA COAGULATION/BICAP);  Surgeon: [name], MD;  Location: [facility] ENDOSCOPY;  Service: Gastroenterology;  Laterality: N/A;   INSERTION OF DIALYSIS CATHETER Right 08/14/2022   Procedure: INSERTION OF TUNNELED DIALYSIS CATHETER USING PALINDROME CATHETER KIT 19CM;  Surgeon: Leonie Douglas, MD;  Location: [facility] OR;  Service: Vascular;  Laterality: Right;   POLYPECTOMY  08/16/2022   Procedure: POLYPECTOMY;  Surgeon: Meridee Score Netty Starring., MD;  Location: [facility] ENDOSCOPY;  Service: Gastroenterology;;   SUBMUCOSAL TATTOO INJECTION  08/16/2022   Procedure: SUBMUCOSAL TATTOO INJECTION;  Surgeon: Lemar Lofty., MD;  Location: [facility] ENDOSCOPY;  Service: Gastroenterology;;   UPPER GASTROINTESTINAL ENDOSCOPY      Family Hx:  Family History  Problem Relation Age of Onset   Diabetes Father    Colon cancer Neg Hx    Esophageal cancer Neg Hx    Rectal cancer Neg Hx    Stomach cancer Neg Hx     Social History:  reports that she has been smoking cigarettes. She has been exposed to tobacco smoke. She has never used smokeless tobacco. She reports  that she does not currently use alcohol. She reports current drug use. Drug: Marijuana.  Allergies:  Allergies  Allergen Reactions   Zestril [Lisinopril] Other (See Comments)    Hyperkalemia    Medications: Prior to Admission medications   Medication Sig Start Date End Date Taking? Authorizing Provider  albuterol (VENTOLIN HFA) 108 (90 Base) MCG/ACT inhaler Inhale 2 puffs into the lungs every 6 (six) hours as needed for wheezing or shortness of breath. 06/11/23  Yes McClung, Angela M, PA-C  amLODipine (NORVASC) 10 MG tablet Take 1 tablet (10 mg total) by mouth  daily. 06/11/23  Yes Georgian Co M, PA-C  atorvastatin (LIPITOR) 10 MG tablet Take 1 tablet (10 mg total) by mouth daily. 06/11/23  Yes McClung, Marzella Schlein, PA-C  busPIRone (BUSPAR) 10 MG tablet Take 1 tablet (10 mg total) by mouth 2 (two) times daily. 02/10/23  Yes Anders Simmonds, PA-C  ferrous sulfate 325 (65 FE) MG tablet Take 1 tablet (325 mg total) by mouth daily with breakfast. 02/01/23  Yes Ghimire, Werner Lean, MD  Fluticasone-Umeclidin-Vilant (TRELEGY ELLIPTA) 100-62.5-25 MCG/ACT AEPB Inhale 1 puff into the lungs daily. 06/11/23  Yes Anders Simmonds, PA-C  gabapentin (NEURONTIN) 100 MG capsule Take 1 capsule (100 mg total) by mouth 3 (three) times daily. 02/01/23  Yes Ghimire, Werner Lean, MD  hydrOXYzine (ATARAX) 25 MG tablet Take 1 tablet (25 mg total) by mouth 3 (three) times daily as needed for anxiety or itching. 06/11/23  Yes Georgian Co M, PA-C  pantoprazole (PROTONIX) 40 MG tablet Take 1 tablet (40 mg total) by mouth 2 (two) times daily. 05/13/23 08/11/23 Yes Elgergawy, Leana Roe, MD  sodium bicarbonate 650 MG tablet Take 2 tablets (1,300 mg total) by mouth 2 (two) times daily. 05/13/23  Yes Elgergawy, Leana Roe, MD  torsemide (DEMADEX) 100 MG tablet Take 1 tablet (100 mg total) by mouth daily. 05/13/23  Yes Elgergawy, Leana Roe, MD  traMADol (ULTRAM) 50 MG tablet Take 1 tablet (50 mg total) by mouth every 8 (eight) hours as needed for up to 5 days. Patient taking differently: Take 50 mg by mouth every 8 (eight) hours as needed for moderate pain (pain score 4-6) or severe pain (pain score 7-10). 06/11/23 06/16/23 Yes McClung, Marzella Schlein, PA-C  traZODone (DESYREL) 50 MG tablet Take 1 tablet (50 mg total) by mouth at bedtime as needed for sleep. 06/11/23  Yes McClung, Angela M, PA-C  ipratropium-albuterol (DUONEB) 0.5-2.5 (3) MG/3ML SOLN Take 3 mLs by nebulization every 6 (six) hours as needed (shortness of breath and wheezing). Patient not taking: Reported on 06/12/2023 05/01/23   [name], MD  multivitamin (RENA-VIT) TABS tablet Take 1 tablet by mouth at bedtime. Patient not taking: Reported on 06/12/2023 02/01/23   Maretta Bees, MD  nicotine (NICODERM CQ - DOSED IN MG/24 HOURS) 21 mg/24hr patch Place 1 patch (21 mg total) onto the skin daily. Patient not taking: Reported on 06/12/2023 06/11/23   Anders Simmonds, PA-C  predniSONE (DELTASONE) 10 MG tablet Take 1 tablet (10 mg total) by mouth daily with breakfast. Patient not taking: Reported on 06/12/2023 06/11/23   Anders Simmonds, PA-C    I have reviewed the patient's current medications.  Labs: Renal Panel: Recent Labs  Lab 06/12/23 1104 06/13/23 0526  NA 135 136  K 5.0 5.7*  CL 108 107  CO2 15* 13*  GLUCOSE 96 88  BUN 67* 70*  CREATININE 5.52* 5.73*  CALCIUM 8.4* 8.2*  CBC:    Latest Ref Rng & Units 06/13/2023    5:26 AM 06/12/2023   10:31 PM 06/12/2023   11:08 AM  CBC  WBC 4.0 - 10.5 K/uL 6.1  7.1  7.0   Hemoglobin 12.0 - 15.0 g/dL 7.4  7.5  6.4   Hematocrit 36.0 - 46.0 % 23.6  24.0  20.4   Platelets 150 - 400 K/uL 229  240  281      Anemia Panel:  Recent Labs    08/13/22 0952 08/13/22 1546 10/25/22 1351 10/25/22 1354 10/25/22 2053 10/26/22 0622 01/23/23 2350 01/24/23 0740 04/15/23 1900 04/16/23 0953 05/18/23 1014 06/11/23 1218 06/12/23 1108 06/12/23 2231 06/13/23 0526 06/13/23 0802  HGB  --    < > 6.5*   < > 8.1*   < >  --    < >  --    < > 8.9* 6.7* 6.4* 7.5* 7.4*  --   MCV  --    < > 100.0  --  94.9   < >  --    < >  --    < >  --  96 101.5* 95.6 95.5  --   VITAMINB12 507  --  420  --   --   --  715  --  387  --   --   --   --   --   --  514  FOLATE 8.5  --   --   --  8.1  --  21.4  --  >40.0  --   --   --   --   --   --  30.3  FERRITIN 160  --   --   --  187  --  488*  --  269  --   --   --   --   --   --  451*  TIBC 280  --   --   --  200*  --  269  --  297  --   --   --   --   --   --  217*  IRON 21*  --   --   --  7*  --  86  --  24*  --   --   --   --   --   --  22*   RETICCTPCT 2.2  --   --   --  2.2  --  5.8*  --  2.9  --   --   --   --   --   --  2.1   < > = values in this interval not displayed.    Recent Labs  Lab 06/12/23 1104 06/13/23 0526  AST 23 16  ALT 19 15  ALKPHOS 93 79  BILITOT 0.7 0.5  PROT 6.9 6.0*  ALBUMIN 2.9* 2.4*    Lab Results  Component Value Date   HGBA1C 5.0 05/16/2023    ROS:  Pertinent items noted in HPI and remainder of comprehensive ROS otherwise negative.  Physical Exam: Vitals:   06/13/23 1002 06/13/23 1154  BP: 135/76 (!) 148/66  Pulse:  92  Resp:  17  Temp:  99.6 F (37.6 C)  SpO2:  91%     General exam: Upset, agitated Respiratory system: Bilateral crackles and mild increased work of breathing Cardiovascular system: S1 & S2 heard, RRR.  No pedal edema. Gastrointestinal system: Abdomen is nondistended, soft and nontender. Normal bowel sounds heard. Central nervous system: Alert and  oriented. No focal neurological deficits. Extremities: No cyanosis or clubbing. Skin: No rashes, lesions or ulcers Vascular access: AV fistula has good thrill and bruit.  Assessment/Plan:  # ESRD with refusal of dialysis in the past: Patient with signs or symptoms of uremia, fluid overload, hyperkalemia, anemia.  I had a Mikita discussion with the patient about starting dialysis today.  She is asking if we can put a spray on the fistula before cannulation.  I have discussed with the nurse about it.  We will go ahead and start HD today for 2 hours using AV fistula.  I am not sure if patient will continue doing dialysis.  Will monitor.  # Anemia: Presumably due to ESRD: Iron saturation only 10% therefore I will order IV iron and Aranesp.  Received blood transfusion as well.  # Hypertension/volume: On antihypertensives, dialysis should help.  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  # Metabolic acidosis due to ESRD: Currently on oral sodium bicarbonate, restarting dialysis as discussed above.  # Hyperkalemia:  Recommend renal diet and dialysis is the mainstay of treatment.  Discussed with the primary team.  Thank you for the consult, we will continue to follow. I have discussed with the dialysis nurses as well.   Benuel Ly Jaynie Collins 06/13/2023, 1:50 PM  [employer].

## 2023-06-14 DIAGNOSIS — D649 Anemia, unspecified: Secondary | ICD-10-CM | POA: Diagnosis not present

## 2023-06-14 DIAGNOSIS — I10 Essential (primary) hypertension: Secondary | ICD-10-CM | POA: Diagnosis not present

## 2023-06-14 DIAGNOSIS — R0602 Shortness of breath: Secondary | ICD-10-CM | POA: Diagnosis not present

## 2023-06-14 DIAGNOSIS — N179 Acute kidney failure, unspecified: Secondary | ICD-10-CM | POA: Diagnosis not present

## 2023-06-14 LAB — CBC WITH DIFFERENTIAL/PLATELET
Abs Immature Granulocytes: 0.1 10*3/uL — ABNORMAL HIGH (ref 0.00–0.07)
Basophils Absolute: 0 10*3/uL (ref 0.0–0.1)
Basophils Relative: 1 %
Eosinophils Absolute: 0.2 10*3/uL (ref 0.0–0.5)
Eosinophils Relative: 2 %
HCT: 20.8 % — ABNORMAL LOW (ref 36.0–46.0)
Hemoglobin: 6.6 g/dL — CL (ref 12.0–15.0)
Immature Granulocytes: 2 %
Lymphocytes Relative: 21 %
Lymphs Abs: 1.3 10*3/uL (ref 0.7–4.0)
MCH: 29.9 pg (ref 26.0–34.0)
MCHC: 31.7 g/dL (ref 30.0–36.0)
MCV: 94.1 fL (ref 80.0–100.0)
Monocytes Absolute: 0.7 10*3/uL (ref 0.1–1.0)
Monocytes Relative: 11 %
Neutro Abs: 3.9 10*3/uL (ref 1.7–7.7)
Neutrophils Relative %: 63 %
Platelets: 240 10*3/uL (ref 150–400)
RBC: 2.21 MIL/uL — ABNORMAL LOW (ref 3.87–5.11)
RDW: 22.3 % — ABNORMAL HIGH (ref 11.5–15.5)
WBC: 6.2 10*3/uL (ref 4.0–10.5)
nRBC: 0.6 % — ABNORMAL HIGH (ref 0.0–0.2)

## 2023-06-14 LAB — BASIC METABOLIC PANEL WITH GFR
Anion gap: 15 (ref 5–15)
BUN: 77 mg/dL — ABNORMAL HIGH (ref 8–23)
CO2: 14 mmol/L — ABNORMAL LOW (ref 22–32)
Calcium: 8 mg/dL — ABNORMAL LOW (ref 8.9–10.3)
Chloride: 105 mmol/L (ref 98–111)
Creatinine, Ser: 6.1 mg/dL — ABNORMAL HIGH (ref 0.44–1.00)
GFR, Estimated: 7 mL/min — ABNORMAL LOW (ref >60)
Glucose, Bld: 95 mg/dL (ref 70–99)
Potassium: 4.8 mmol/L (ref 3.5–5.1)
Sodium: 134 mmol/L — ABNORMAL LOW (ref 135–145)

## 2023-06-14 LAB — HEPATITIS B SURFACE ANTIBODY, QUANTITATIVE: Hep B S AB Quant (Post): 8 m[IU]/mL — ABNORMAL LOW

## 2023-06-14 LAB — HEMOGLOBIN AND HEMATOCRIT, BLOOD
HCT: 27.2 % — ABNORMAL LOW (ref 36.0–46.0)
Hemoglobin: 9.2 g/dL — ABNORMAL LOW (ref 12.0–15.0)

## 2023-06-14 LAB — PREPARE RBC (CROSSMATCH)

## 2023-06-14 LAB — PHOSPHORUS: Phosphorus: 6.7 mg/dL — ABNORMAL HIGH (ref 2.5–4.6)

## 2023-06-14 MED ORDER — SODIUM CHLORIDE 0.9% IV SOLUTION: Freq: Once | INTRAVENOUS | Status: DC

## 2023-06-14 MED ORDER — ALBUTEROL SULFATE (2.5 MG/3ML) 0.083% IN NEBU
2.5000 mg | INHALATION_SOLUTION | Freq: Four times a day (QID) | RESPIRATORY_TRACT | Status: DC | PRN
  Administered 2023-06-16 – 2023-06-17 (×2): 2.5 mg via RESPIRATORY_TRACT
  Filled 2023-06-14 (×2): qty 3

## 2023-06-14 MED ORDER — TRAMADOL HCL 50 MG PO TABS: 50.0000 mg | ORAL_TABLET | Freq: Once | ORAL | Status: DC

## 2023-06-14 MED ORDER — LIDOCAINE HCL (PF) 1 % IJ SOLN: 5.0000 mL | INTRAMUSCULAR | Status: DC | PRN

## 2023-06-14 MED ORDER — ALBUTEROL SULFATE HFA 108 (90 BASE) MCG/ACT IN AERS: 2.0000 | INHALATION_SPRAY | Freq: Four times a day (QID) | RESPIRATORY_TRACT | Status: DC | PRN

## 2023-06-14 MED ORDER — ANTICOAGULANT SODIUM CITRATE 4% (200MG/5ML) IV SOLN: 5.0000 mL | Status: DC | PRN

## 2023-06-14 MED ORDER — SEVELAMER CARBONATE 800 MG PO TABS
800.0000 mg | ORAL_TABLET | Freq: Three times a day (TID) | ORAL | Status: DC
  Administered 2023-06-14 – 2023-06-17 (×7): 800 mg via ORAL
  Filled 2023-06-14 (×7): qty 1

## 2023-06-14 MED ORDER — HEPARIN SODIUM (PORCINE) 1000 UNIT/ML DIALYSIS: 1000.0000 [IU] | INTRAMUSCULAR | Status: DC | PRN

## 2023-06-14 MED ORDER — TRAMADOL HCL 50 MG PO TABS
50.0000 mg | ORAL_TABLET | Freq: Once | ORAL | Status: AC
  Administered 2023-06-14: 50 mg via ORAL

## 2023-06-14 MED ORDER — PENTAFLUOROPROP-TETRAFLUOROETH EX AERO: 1.0000 | INHALATION_SPRAY | CUTANEOUS | Status: DC | PRN

## 2023-06-14 MED ORDER — LIDOCAINE-PRILOCAINE 2.5-2.5 % EX CREA
1.0000 | TOPICAL_CREAM | CUTANEOUS | Status: DC | PRN
Start: 2023-06-14 — End: 2023-06-14

## 2023-06-14 MED ORDER — ALTEPLASE 2 MG IJ SOLR: 2.0000 mg | Freq: Once | INTRAMUSCULAR | Status: DC | PRN

## 2023-06-14 NOTE — Plan of Care (Signed)
  Problem: Education: Goal: Knowledge of General Education information will improve Description: Including pain rating scale, medication(s)/side effects and non-pharmacologic comfort measures Outcome: Progressing   Problem: Health Behavior/Discharge Planning: Goal: Ability to manage health-related needs will improve Outcome: Progressing   Problem: Clinical Measurements: Goal: Ability to maintain clinical measurements within normal limits will improve Outcome: Progressing Goal: Will remain free from infection Outcome: Progressing Goal: Diagnostic test results will improve Outcome: Progressing Goal: Respiratory complications will improve Outcome: Progressing Goal: Cardiovascular complication will be avoided Outcome: Progressing   Problem: Activity: Goal: Risk for activity intolerance will decrease Outcome: Progressing   Problem: Nutrition: Goal: Adequate nutrition will be maintained Outcome: Progressing   Problem: Coping: Goal: Level of anxiety will decrease Outcome: Progressing   Problem: Safety: Goal: Ability to remain free from injury will improve Outcome: Progressing   Problem: Skin Integrity: Goal: Risk for impaired skin integrity will decrease Outcome: Progressing   

## 2023-06-14 NOTE — Progress Notes (Signed)
 Pt refused HD again this morning. Pt stated she will on go to HD if she get pain medication in her IV. Informed pt in great depth the importance of HD and the decline she is having because of refusing HD. Pt still refusing adamantly and demanding narcotics. Pt begin to cry and scream at nurse. MD made aware.

## 2023-06-14 NOTE — Progress Notes (Signed)
 PROGRESS NOTE                                                                                                                                                                                                             Patient Demographics:    [name], is a 63 y.o. female, DOB - March 22, 1960, XBJ:[id number]  Outpatient Primary MD for the patient is [name], MD    LOS - 1  Admit date - 06/12/2023    Chief Complaint  Patient presents with   Shortness of Breath       Brief Narrative (HPI from H&P)   63 y.o. female with medical history significant of hypertension, hyperlipidemia, GERD, PUD, gastritis, GI bleed, hepatitis C, pleural effusion, substance use, anxiety, depression, COPD, anemia, chronic pain, ESRD with refusal of HD presenting with shortness of breath.   Patient recently admitted 2/20-2/25 with GI bleed and progression to ESRD but patient declined HD during that visit and was given Lasix as needed.  Patient readmitted 2/28-3/2.  Admitted to critical care in the ICU due to significant oxygen requirement/hypoxia in the setting of volume overload.  At that time she was agreeable to 2 sessions of dialysis and was weaning down on her oxygen but ultimately left AMA.   She presents again after being seen by her PCP and found to be anemic.  She was seen for shortness of breath and concern for worsening hemoglobin and her hemoglobin was down to 6.7 which is a 2 g drop in 3 weeks with hemoglobin of 8.93 weeks ago.  Patient has some chronic dark stools due to being on iron as well as some chronic intermittent abdominal pain.  He was also found to have fluid overload and worsening renal function.  She was admitted for further care.    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  Both GI and nephrology been consulted, no signs of ongoing brisk bleeding,  on PPI, for now IV diuretics, GI has signed off supportive care to continue.  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 She was discharged on 2/25 with H/H 9.2/26.    ESRD  Due to financial issues, homeless situation and transportation issues she has been declining HD in the past, currently she is agreeable to HD treatment nephrology consulted see above.  Patient has been refusing HD treatments here despite counseling, if she continues to refuse we will call palliative care for goals of care.    Hypertension - diuretics per nephrology, Norvasc.  Monitor   Hyperlipidemia  - Continue home atorvastatin   GERD PUD  Gastritis  - does have anemia Hemoccult positive, no need for transfusion continue monitor CBC, iron and folic acid supplementation, PPI, GI on board EGD once respiratory status improves and fluid is removed via HD.   Substance use  - Noted, counseled to quit.   Anxiety Depression  - Continue home BuSpar and trazodone   COPD  - Replace home Trelegy with formulary Breo and Incruse  As needed albuterol   Chronic pain - Treat as needed        Condition -   Guarded  Family Communication  :   None  Code Status :  DNR  Consults  :  GI, Renal  PUD Prophylaxis :     Procedures  :            Disposition Plan  :    Status is: Observation   DVT Prophylaxis  :    SCDs Start: 06/12/23 1422    Lab Results  Component Value Date   PLT 240 06/14/2023    Diet :  Diet Order              Diet renal with fluid restriction Fluid restriction: 1500 mL Fluid; Room service appropriate? Yes; Fluid consistency: Thin  Diet effective now                    Inpatient Medications  Scheduled Meds:  sodium chloride   Intravenous Once   amLODipine  10 mg Oral Daily   atorvastatin  10 mg Oral Daily   Chlorhexidine Gluconate Cloth  6 each Topical Q0600   darbepoetin (ARANESP) injection - DIALYSIS  100 mcg Subcutaneous Q Fri-1800   ferrous sulfate  325 mg Oral BID WC   fluticasone furoate-vilanterol  1 puff Inhalation Daily   And   umeclidinium bromide  1 puff Inhalation Daily   folic acid  1 mg Oral Daily   nicotine  21 mg Transdermal Daily   pantoprazole (PROTONIX) IV  40 mg Intravenous Q12H   sodium bicarbonate  1,300 mg Oral BID   sodium chloride flush  3 mL Intravenous Q12H   torsemide  100 mg Oral Daily   Continuous Infusions:  anticoagulant sodium citrate     iron sucrose 200 mg (06/13/23 1740)   PRN Meds:.acetaminophen **OR** acetaminophen, alteplase, anticoagulant sodium citrate, heparin, hydrALAZINE, lidocaine (PF), lidocaine-prilocaine, pentafluoroprop-tetrafluoroeth, polyethylene glycol, traMADol, traZODone  Antibiotics  :    Anti-infectives (From admission, onward)    None         Objective:   Vitals:   06/13/23 1154 06/13/23 2019 06/14/23 0019 06/14/23 0400  BP: (!) 148/66 (!) 140/84 (!) 142/61 (!) 150/97  Pulse: 92 90  86 88  Resp: 17 17 17 15   Temp: 99.6 F (37.6 C) 99.8 F (37.7 C) 99.5 F (37.5 C) 99 F (37.2 C)  TempSrc: Oral Oral Oral Oral  SpO2: 91% 91% 91% 95%  Weight:      Height:        Wt Readings from Last 3 Encounters:  06/12/23 54.4 kg  06/11/23 54 kg  05/17/23 59.9 kg     Intake/Output Summary (Last 24 hours) at 06/14/2023 0832 Last data filed at 06/13/2023 1900 Gross per 24 hour  Intake 800 ml  Output 0 ml  Net 800 ml     Physical Exam  Awake Alert, No new F.N deficits, absolutely in no distress  having breakfast comfortably [place].AT,PERRAL Supple Neck, No JVD,   Symmetrical Chest wall movement, Mod air movement bilaterally, bibasilar crackles RRR,No Gallops,Rubs or new Murmurs,  +ve B.Sounds, Abd Soft, No tenderness,   No Cyanosis, Clubbing or edema     RN pressure injury documentation: Pressure Injury 11/10/22 Coccyx Mid Stage 2 -  Partial thickness loss of dermis presenting as a shallow open injury with a red, pink wound bed without slough. (Active)  11/10/22 0205  Location: Coccyx  Location Orientation: Mid  Staging: Stage 2 -  Partial thickness loss of dermis presenting as a shallow open injury with a red, pink wound bed without slough.  Wound Description (Comments):   Present on Admission: Yes     Pressure Injury 11/10/22 Buttocks Left Stage 2 -  Partial thickness loss of dermis presenting as a shallow open injury with a red, pink wound bed without slough. (Active)  11/10/22 0205  Location: Buttocks  Location Orientation: Left  Staging: Stage 2 -  Partial thickness loss of dermis presenting as a shallow open injury with a red, pink wound bed without slough.  Wound Description (Comments):   Present on Admission: Yes     Pressure Injury 05/16/23 Buttocks Left Stage 2 -  Partial thickness loss of dermis presenting as a shallow open injury with a red, pink wound bed without slough. (Active)  05/16/23 1600  Location: Buttocks  Location Orientation: Left  Staging: Stage 2 -  Partial thickness loss of dermis presenting as a shallow open injury with a red, pink wound bed without slough.  Wound Description (Comments):   Present on Admission: Yes      Data Review:    Recent Labs  Lab 06/11/23 1218 06/12/23 1108 06/12/23 2231 06/13/23 0526 06/13/23 1555 06/14/23 0517  WBC 6.0 7.0 7.1 6.1 7.3 6.2  HGB 6.7* 6.4* 7.5* 7.4* 7.4* 6.6*  HCT 20.4* 20.4* 24.0* 23.6* 22.9* 20.8*  PLT 294 281 240 229 251 240  MCV 96 101.5* 95.6 95.5 93.5 94.1  MCH 31.6 31.8 29.9 30.0 30.2  29.9  MCHC 32.8 31.4 31.3 31.4 32.3 31.7  RDW 16.9* 20.0* 22.4* 22.9* 22.8* 22.3*  LYMPHSABS 1.1 1.0  --   --   --  1.3  MONOABS  --  0.7  --   --   --  0.7  EOSABS 0.2 0.1  --   --   --  0.2  BASOSABS 0.0 0.0  --   --   --  0.0    Recent Labs  Lab 06/12/23 1104 06/13/23 0526 06/14/23 0517  NA 135 136 134*  K 5.0 5.7* 4.8  CL 108 107 105  CO2 15* 13* 14*  ANIONGAP 12 16* 15  GLUCOSE 96 88 95  BUN 67* 70* 77*  CREATININE 5.52*  5.73* 6.10*  AST 23 16  --   ALT 19 15  --   ALKPHOS 93 79  --   BILITOT 0.7 0.5  --   ALBUMIN 2.9* 2.4*  --   PHOS  --   --  6.7*  CALCIUM 8.4* 8.2* 8.0*      Recent Labs  Lab 06/12/23 1104 06/13/23 0526 06/14/23 0517  CALCIUM 8.4* 8.2* 8.0*    --------------------------------------------------------------------------------------------------------------- Lab Results  Component Value Date   CHOL 135 10/25/2022   HDL 43 10/25/2022   LDLCALC 70 10/25/2022   TRIG 111 10/25/2022   CHOLHDL 3.1 10/25/2022    Lab Results  Component Value Date   HGBA1C 5.0 05/16/2023   No results for input(s): "TSH", "T4TOTAL", "FREET4", "T3FREE", "THYROIDAB" in the last 72 hours. Recent Labs    06/13/23 0802  VITAMINB12 514  FOLATE 30.3  FERRITIN 451*  TIBC 217*  IRON 22*  RETICCTPCT 2.1   ------------------------------------------------------------------------------------------------------------------ Cardiac Enzymes No results for input(s): "CKMB", "TROPONINI", "MYOGLOBIN" in the last 168 hours.  Invalid input(s): "CK"  Micro Results No results found for this or any previous visit (from the past 240 hours).  Radiology Report DG CHEST PORT 1 VIEW Result Date: 06/12/2023 CLINICAL DATA:  Dyspnea.  Respiratory distress. EXAM: PORTABLE CHEST 1 VIEW COMPARISON:  06/12/2023 FINDINGS: Cardiac enlargement. Small bilateral pleural effusions, greater on the right. Increasing interstitial and airspace infiltrates in both lungs since prior study. This  may represent progressing edema or pneumonia superimposed on chronic fibrosis. Mediastinal contours appear intact. IMPRESSION: Cardiac enlargement. Pleural effusions, greater on the right. Increasing infiltrates in the lungs. Electronically Signed   By: Burman Nieves M.D.   On: 06/12/2023 19:44   DG Chest 2 View Result Date: 06/12/2023 CLINICAL DATA:  Shortness of breath. EXAM: CHEST - 2 VIEW COMPARISON:  May 16, 2023. FINDINGS: Stable cardiomediastinal silhouette. Mild right pleural effusion which is increased compared to prior exam. There appears to be associated right basilar atelectasis. Minimal interstitial densities are noted in both lung bases concerning for edema. Bony thorax is unremarkable. IMPRESSION: Probable minimal bibasilar pulmonary edema. Mild right pleural effusion is noted which is slightly enlarged compared to prior exam with associated subsegmental atelectasis. Electronically Signed   By: Lupita Raider M.D.   On: 06/12/2023 12:32     Signature  -   Susa Raring M.D on 06/14/2023 at 8:32 AM   -  To page go to [email or web address]

## 2023-06-14 NOTE — Progress Notes (Signed)
 [facility] NEPHROLOGY PROGRESS NOTE  Assessment/ Plan: Pt is a 63 y.o. yo female  with past medical history significant for hypertension, HLD, hep C, pleural effusion, polysubstance abuse, anxiety depression, COPD, ESRD and refusing dialysis, anemia presented with worsening shortness of breath, seen as a consultation for the management of ESRD   # ESRD with refusal of dialysis in the past: Patient with signs or symptoms of uremia, fluid overload, hyperkalemia, anemia.  Refused to dialysis yesterday but willing to do this morning.  We will spray the fistula before cannulation.  I hope she will complete 2 hours of treatment today.  Already contacted renal navigator to arrange outpatient dialysis.  It seems like she was clipped twice before but patient did not go.  Will monitor.    # Anemia: Presumably due to ESRD: Iron saturation only 10% therefore started IV iron and Aranesp.  Receiving blood transfusion as well.   # Hypertension/volume: On antihypertensives, dialysis should help.   # CKD-MBD/hyperphosphatemia: Pending PTH, start sevelamer for hyperphosphatemia.  # Metabolic acidosis due to ESRD; discontinue oral sodium bicarbonate since patient is going to HD.  # Hyperkalemia: Recommend renal diet and dialysis is the mainstay of treatment.  Discussed with primary team and the dialysis nurse.  Subjective: Seen and examined at dialysis unit.  Not cannulating yet.  Denies nausea, vomiting, chest pain but feels tired and generalized body pain.  Noted hemoglobin down to 6.6 and plan to receive blood transfusion. Objective Vital signs in last 24 hours: Vitals:   06/14/23 0957 06/14/23 1012 06/14/23 1013 06/14/23 1031  BP: 136/72 (!) 128/56 (!) 128/56 (!) 141/66  Pulse: 91 79 80 78  Resp: 17 (!) 28 (!) 26 19  Temp: 98.8 F (37.1 C) 98.8 F (37.1 C)  98.5 F (36.9 C)  TempSrc: Oral Oral  Oral  SpO2: 99% 97% 99% 100%  Weight:      Height:       Weight change:    Intake/Output Summary (Last 24 hours) at 06/14/2023 1048 Last data filed at 06/13/2023 1900 Gross per 24 hour  Intake 800 ml  Output 0 ml  Net 800 ml       Labs: RENAL PANEL Recent Labs  Lab 06/12/23 1104 06/13/23 0526 06/14/23 0517  NA 135 136 134*  K 5.0 5.7* 4.8  CL 108 107 105  CO2 15* 13* 14*  GLUCOSE 96 88 95  BUN 67* 70* 77*  CREATININE 5.52* 5.73* 6.10*  CALCIUM 8.4* 8.2* 8.0*  PHOS  --   --  6.7*  ALBUMIN 2.9* 2.4*  --     Liver Function Tests: Recent Labs  Lab 06/12/23 1104 06/13/23 0526  AST 23 16  ALT 19 15  ALKPHOS 93 79  BILITOT 0.7 0.5  PROT 6.9 6.0*  ALBUMIN 2.9* 2.4*   No results for input(s): "LIPASE", "AMYLASE" in the last 168 hours. No results for input(s): "AMMONIA" in the last 168 hours. CBC: Recent Labs    08/13/22 0952 08/13/22 1546 10/25/22 1351 10/25/22 1354 10/25/22 2053 10/26/22 0622 01/23/23 2350 01/24/23 0740 04/15/23 1900 04/16/23 0953 06/12/23 1108 06/12/23 2231 06/13/23 0526 06/13/23 0802 06/13/23 1555 06/14/23 0517  HGB  --    < > 6.5*   < > 8.1*   < >  --    < >  --    < > 6.4* 7.5* 7.4*  --  7.4* 6.6*  MCV  --    < > 100.0  --  94.9   < >  --    < >  --    < >  101.5* 95.6 95.5  --  93.5 94.1  VITAMINB12 507  --  420  --   --   --  715  --  387  --   --   --   --  514  --   --   FOLATE 8.5  --   --   --  8.1  --  21.4  --  >40.0  --   --   --   --  30.3  --   --   FERRITIN 160  --   --   --  187  --  488*  --  269  --   --   --   --  451*  --   --   TIBC 280  --   --   --  200*  --  269  --  297  --   --   --   --  217*  --   --   IRON 21*  --   --   --  7*  --  86  --  24*  --   --   --   --  22*  --   --   RETICCTPCT 2.2  --   --   --  2.2  --  5.8*  --  2.9  --   --   --   --  2.1  --   --    < > = values in this interval not displayed.    Cardiac Enzymes: No results for input(s): "CKTOTAL", "CKMB", "CKMBINDEX", "TROPONINI" in the last 168 hours. CBG: No results for input(s): "GLUCAP" in the last 168  hours.  Iron Studies:  Recent Labs    06/13/23 0802  IRON 22*  TIBC 217*  FERRITIN 451*   Studies/Results: DG CHEST PORT 1 VIEW Result Date: 06/12/2023 CLINICAL DATA:  Dyspnea.  Respiratory distress. EXAM: PORTABLE CHEST 1 VIEW COMPARISON:  06/12/2023 FINDINGS: Cardiac enlargement. Small bilateral pleural effusions, greater on the right. Increasing interstitial and airspace infiltrates in both lungs since prior study. This may represent progressing edema or pneumonia superimposed on chronic fibrosis. Mediastinal contours appear intact. IMPRESSION: Cardiac enlargement. Pleural effusions, greater on the right. Increasing infiltrates in the lungs. Electronically Signed   By: Burman Nieves M.D.   On: 06/12/2023 19:44   DG Chest 2 View Result Date: 06/12/2023 CLINICAL DATA:  Shortness of breath. EXAM: CHEST - 2 VIEW COMPARISON:  May 16, 2023. FINDINGS: Stable cardiomediastinal silhouette. Mild right pleural effusion which is increased compared to prior exam. There appears to be associated right basilar atelectasis. Minimal interstitial densities are noted in both lung bases concerning for edema. Bony thorax is unremarkable. IMPRESSION: Probable minimal bibasilar pulmonary edema. Mild right pleural effusion is noted which is slightly enlarged compared to prior exam with associated subsegmental atelectasis. Electronically Signed   By: Lupita Raider M.D.   On: 06/12/2023 12:32    Medications: Infusions:  anticoagulant sodium citrate     iron sucrose 200 mg (06/13/23 1740)    Scheduled Medications:  sodium chloride   Intravenous Once   sodium chloride   Intravenous Once   amLODipine  10 mg Oral Daily   atorvastatin  10 mg Oral Daily   Chlorhexidine Gluconate Cloth  6 each Topical Q0600   darbepoetin (ARANESP) injection - DIALYSIS  100 mcg Subcutaneous Q Fri-1800   ferrous sulfate  325 mg Oral BID WC   fluticasone furoate-vilanterol  1 puff Inhalation Daily  And   umeclidinium  bromide  1 puff Inhalation Daily   folic acid  1 mg Oral Daily   nicotine  21 mg Transdermal Daily   pantoprazole (PROTONIX) IV  40 mg Intravenous Q12H   sodium bicarbonate  1,300 mg Oral BID   sodium chloride flush  3 mL Intravenous Q12H   torsemide  100 mg Oral Daily    have reviewed scheduled and prn medications.  Physical Exam: General:NAD, comfortable Heart:RRR, s1s2 nl Lungs:clear b/l, no crackle Abdomen:soft, Non-tender, non-distended Extremities:No edema Dialysis Access: AV fistula has a good thrill and bruit.  [name] Prasad Aicha Clingenpeel 06/14/2023,10:48 AM  LOS: 1 day

## 2023-06-14 NOTE — Progress Notes (Signed)
 PT Cancellation Note  Patient Details Name: [name] MRN: [id number] DOB: 18-May-1960   Cancelled Treatment:    Reason Eval/Treat Not Completed: Patient at procedure or test/unavailable  Off unit for HD at this time. Will follow-up later as schedule permits. Possibly need to try PT evaluation tomorrow depending what time she returns from HD.  Kathlyn [place], PT, DPT [facility]  Rehabilitation Services Physical Therapist Office: [phone] Website: [email or web address]   Berton Mount 06/14/2023, 11:46 AM

## 2023-06-14 NOTE — Progress Notes (Signed)
 Received patient in bed to unit.  Alert and oriented.  Informed consent signed and in chart.   TX duration: 2 hours  Patient tolerated well.  Transported back to the room  Alert, without acute distress.  Hand-off given to patient's nurse.   Access used: Upper Arm R fistula Access issues: none  Total UF removed: 1L Medication(s) given: 2 Units of Blood   06/14/23 1211  Vitals  Temp 98 F (36.7 C)  Temp Source Oral  BP (!) 160/67  Pulse Rate 88  Resp (!) 26  Oxygen Therapy  SpO2 96 %  O2 Device Nasal Cannula  O2 Flow Rate (L/min) 4 L/min  Patient Activity (if Appropriate) In bed  Pulse Oximetry Type Continuous  During Treatment Monitoring  Blood Flow Rate (mL/min) 200 mL/min  Arterial Pressure (mmHg) -68.88 mmHg  Venous Pressure (mmHg) 130.09 mmHg  TMP (mmHg) 15.75 mmHg  Ultrafiltration Rate (mL/min) 650 mL/min  Dialysate Flow Rate (mL/min) 200 ml/min  Dialysate Potassium Concentration 2  Dialysate Calcium Concentration 2.5  HD Safety Checks Performed Yes  Intra-Hemodialysis Comments Tx completed  Dialysis Fluid Bolus Normal Saline  Bolus Amount (mL) 300 mL  Post Treatment  Dialyzer Clearance Clear  Liters Processed 24  Fluid Removed (mL) 1000 mL  Tolerated HD Treatment Yes  AVG/AVF Arterial Site Held (minutes) 7 minutes  AVG/AVF Venous Site Held (minutes) 7 minutes  Fistula / Graft Right Upper arm Arteriovenous fistula  Placement Date/Time: 08/14/22 1230   Orientation: Right  Access Location: (c) Upper arm  Access Type: Arteriovenous fistula  Status Deaccessed     Stacie Glaze LPN [facility]

## 2023-06-14 NOTE — Progress Notes (Signed)
 Pt back from HD at approx 1320, pt rcd 2 units of PRBC in HD.  This RN met pt in the room on arrival back, placed back on tele, VS obtained, medicated with AM meds that were not given prior to HD, ambulated with assist to the BR, lunch ordered.  Pt made aware of repeat bloodwork at 1500.  Bed alarm on and call bell within reach

## 2023-06-15 DIAGNOSIS — N179 Acute kidney failure, unspecified: Secondary | ICD-10-CM | POA: Diagnosis not present

## 2023-06-15 LAB — BPAM RBC
Blood Product Expiration Date: 202504192359
Blood Product Expiration Date: 202504222359
Blood Product Expiration Date: 202504272359
ISSUE DATE / TIME: 202503271627
ISSUE DATE / TIME: 202503291006
ISSUE DATE / TIME: 202503291006
Unit Type and Rh: 6200
Unit Type and Rh: 6200
Unit Type and Rh: 6200

## 2023-06-15 LAB — CBC WITH DIFFERENTIAL/PLATELET
Abs Immature Granulocytes: 0.12 10*3/uL — ABNORMAL HIGH (ref 0.00–0.07)
Basophils Absolute: 0 10*3/uL (ref 0.0–0.1)
Basophils Relative: 0 %
Eosinophils Absolute: 0.2 10*3/uL (ref 0.0–0.5)
Eosinophils Relative: 4 %
HCT: 29.9 % — ABNORMAL LOW (ref 36.0–46.0)
Hemoglobin: 10 g/dL — ABNORMAL LOW (ref 12.0–15.0)
Immature Granulocytes: 2 %
Lymphocytes Relative: 17 %
Lymphs Abs: 0.9 10*3/uL (ref 0.7–4.0)
MCH: 29.9 pg (ref 26.0–34.0)
MCHC: 33.4 g/dL (ref 30.0–36.0)
MCV: 89.5 fL (ref 80.0–100.0)
Monocytes Absolute: 0.7 10*3/uL (ref 0.1–1.0)
Monocytes Relative: 14 %
Neutro Abs: 3.2 10*3/uL (ref 1.7–7.7)
Neutrophils Relative %: 63 %
Platelets: 229 10*3/uL (ref 150–400)
RBC: 3.34 MIL/uL — ABNORMAL LOW (ref 3.87–5.11)
RDW: 19.6 % — ABNORMAL HIGH (ref 11.5–15.5)
WBC: 5.2 10*3/uL (ref 4.0–10.5)
nRBC: 0 % (ref 0.0–0.2)

## 2023-06-15 LAB — TYPE AND SCREEN
ABO/RH(D): A POS
Antibody Screen: NEGATIVE
Unit division: 0
Unit division: 0
Unit division: 0

## 2023-06-15 LAB — BASIC METABOLIC PANEL WITH GFR
Anion gap: 8 (ref 5–15)
BUN: 53 mg/dL — ABNORMAL HIGH (ref 8–23)
CO2: 21 mmol/L — ABNORMAL LOW (ref 22–32)
Calcium: 8 mg/dL — ABNORMAL LOW (ref 8.9–10.3)
Chloride: 103 mmol/L (ref 98–111)
Creatinine, Ser: 4.45 mg/dL — ABNORMAL HIGH (ref 0.44–1.00)
GFR, Estimated: 11 mL/min — ABNORMAL LOW (ref >60)
Glucose, Bld: 103 mg/dL — ABNORMAL HIGH (ref 70–99)
Potassium: 4.3 mmol/L (ref 3.5–5.1)
Sodium: 132 mmol/L — ABNORMAL LOW (ref 135–145)

## 2023-06-15 LAB — PARATHYROID HORMONE, INTACT (NO CA): PTH: 682 pg/mL — ABNORMAL HIGH (ref 15–65)

## 2023-06-15 MED ORDER — BUSPIRONE HCL 5 MG PO TABS
10.0000 mg | ORAL_TABLET | Freq: Two times a day (BID) | ORAL | Status: DC
  Administered 2023-06-15 – 2023-06-17 (×4): 10 mg via ORAL
  Filled 2023-06-15 (×4): qty 2

## 2023-06-15 MED ORDER — CHLORHEXIDINE GLUCONATE CLOTH 2 % EX PADS: 6.0000 | MEDICATED_PAD | Freq: Every day | CUTANEOUS | Status: DC

## 2023-06-15 MED ORDER — CALCITRIOL 0.25 MCG PO CAPS
0.2500 ug | ORAL_CAPSULE | Freq: Every day | ORAL | Status: DC
  Administered 2023-06-15 – 2023-06-17 (×3): 0.25 ug via ORAL
  Filled 2023-06-15 (×3): qty 1

## 2023-06-15 MED ORDER — GABAPENTIN 100 MG PO CAPS
100.0000 mg | ORAL_CAPSULE | Freq: Three times a day (TID) | ORAL | Status: DC
  Administered 2023-06-15 – 2023-06-17 (×5): 100 mg via ORAL
  Filled 2023-06-15 (×5): qty 1

## 2023-06-15 MED ORDER — HYDROXYZINE HCL 25 MG PO TABS
25.0000 mg | ORAL_TABLET | Freq: Three times a day (TID) | ORAL | Status: DC | PRN
  Administered 2023-06-15 – 2023-06-17 (×4): 25 mg via ORAL
  Filled 2023-06-15 (×4): qty 1

## 2023-06-15 MED ORDER — GUAIFENESIN-DM 100-10 MG/5ML PO SYRP
10.0000 mL | ORAL_SOLUTION | ORAL | Status: DC | PRN
  Administered 2023-06-15 (×2): 10 mL via ORAL
  Filled 2023-06-15 (×2): qty 10

## 2023-06-15 MED ORDER — BENZONATATE 100 MG PO CAPS
200.0000 mg | ORAL_CAPSULE | Freq: Two times a day (BID) | ORAL | Status: AC
  Administered 2023-06-15 – 2023-06-17 (×4): 200 mg via ORAL
  Filled 2023-06-15 (×4): qty 2

## 2023-06-15 NOTE — Progress Notes (Signed)
 [facility] NEPHROLOGY PROGRESS NOTE  Assessment/ Plan: Pt is a 63 y.o. yo female  with past medical history significant for hypertension, HLD, hep C, pleural effusion, polysubstance abuse, anxiety depression, COPD, ESRD and refusing dialysis, anemia presented with worsening shortness of breath, seen as a consultation for the management of ESRD   # ESRD with refusal of dialysis in the past: Patient with signs or symptoms of uremia, fluid overload, hyperkalemia, anemia.  First HD on 3/29 with 1 L UF, tolerated well.  Patient feels good today.  She needs to have a spray on the fistula before cannulation.  I hope she will continue HD treatment as recommended as she has history of frequent denial and refusal.  In that case I recommend palliative hospice care.  For now, she is willing to continue dialysis therefore plan for next HD tomorrow.  Renal navigator was contacted to arrange outpatient HD It seems like she was clipped twice before but patient did not go.  Will monitor.    # Anemia: Presumably due to ESRD: Iron saturation only 10% therefore started IV iron and Aranesp.  Receiving blood transfusion as well.   # Hypertension/volume: On antihypertensives, dialysis should help.   # CKD-MBD/hyperphosphatemia:  PTH 555, start calcitriol and sevelamer for hyperphosphatemia.  # Metabolic acidosis due to ESRD; discontinue oral sodium bicarbonate since patient is going to HD.  # Hyperkalemia: Recommend renal diet and dialysis is the mainstay of treatment.  Discussed with primary team and the dialysis nurse.  Subjective: Seen and examined.  Reports feeling much better today.  Denies nausea, vomiting, chest pain, shortness of breath. Objective Vital signs in last 24 hours: Vitals:   06/14/23 1959 06/15/23 0400 06/15/23 0756 06/15/23 0843  BP:  (!) 140/65 (!) 158/70   Pulse: 91  84   Resp: 20 14 20    Temp: 98.8 F (37.1 C) 97.7 F (36.5 C)  97.7 F (36.5 C)  TempSrc: Oral Oral   Oral  SpO2:      Weight:      Height:       Weight change:   Intake/Output Summary (Last 24 hours) at 06/15/2023 1225 Last data filed at 06/14/2023 1716 Gross per 24 hour  Intake 120 ml  Output --  Net 120 ml       Labs: RENAL PANEL Recent Labs  Lab 06/12/23 1104 06/13/23 0526 06/14/23 0517 06/15/23 0525  NA 135 136 134* 132*  K 5.0 5.7* 4.8 4.3  CL 108 107 105 103  CO2 15* 13* 14* 21*  GLUCOSE 96 88 95 103*  BUN 67* 70* 77* 53*  CREATININE 5.52* 5.73* 6.10* 4.45*  CALCIUM 8.4* 8.2* 8.0* 8.0*  PHOS  --   --  6.7*  --   ALBUMIN 2.9* 2.4*  --   --     Liver Function Tests: Recent Labs  Lab 06/12/23 1104 06/13/23 0526  AST 23 16  ALT 19 15  ALKPHOS 93 79  BILITOT 0.7 0.5  PROT 6.9 6.0*  ALBUMIN 2.9* 2.4*   No results for input(s): "LIPASE", "AMYLASE" in the last 168 hours. No results for input(s): "AMMONIA" in the last 168 hours. CBC: Recent Labs    08/13/22 0952 08/13/22 1546 10/25/22 1351 10/25/22 1354 10/25/22 2053 10/26/22 0622 01/23/23 2350 01/24/23 0740 04/15/23 1900 04/16/23 9562 06/13/23 0526 06/13/23 0802 06/13/23 1555 06/14/23 0517 06/14/23 1543 06/15/23 0525  HGB  --    < > 6.5*   < > 8.1*   < >  --    < >  --    < >  7.4*  --  7.4* 6.6* 9.2* 10.0*  MCV  --    < > 100.0  --  94.9   < >  --    < >  --    < > 95.5  --  93.5 94.1  --  89.5  VITAMINB12 507  --  420  --   --   --  715  --  387  --   --  514  --   --   --   --   FOLATE 8.5  --   --   --  8.1  --  21.4  --  >40.0  --   --  30.3  --   --   --   --   FERRITIN 160  --   --   --  187  --  488*  --  269  --   --  451*  --   --   --   --   TIBC 280  --   --   --  200*  --  269  --  297  --   --  217*  --   --   --   --   IRON 21*  --   --   --  7*  --  86  --  24*  --   --  22*  --   --   --   --   RETICCTPCT 2.2  --   --   --  2.2  --  5.8*  --  2.9  --   --  2.1  --   --   --   --    < > = values in this interval not displayed.    Cardiac Enzymes: No results for input(s):  "CKTOTAL", "CKMB", "CKMBINDEX", "TROPONINI" in the last 168 hours. CBG: No results for input(s): "GLUCAP" in the last 168 hours.  Iron Studies:  Recent Labs    06/13/23 0802  IRON 22*  TIBC 217*  FERRITIN 451*   Studies/Results: No results found.   Medications: Infusions:  iron sucrose Stopped (06/14/23 1810)    Scheduled Medications:  sodium chloride   Intravenous Once   sodium chloride   Intravenous Once   amLODipine  10 mg Oral Daily   atorvastatin  10 mg Oral Daily   Chlorhexidine Gluconate Cloth  6 each Topical Q0600   darbepoetin (ARANESP) injection - DIALYSIS  100 mcg Subcutaneous Q Fri-1800   ferrous sulfate  325 mg Oral BID WC   fluticasone furoate-vilanterol  1 puff Inhalation Daily   And   umeclidinium bromide  1 puff Inhalation Daily   folic acid  1 mg Oral Daily   nicotine  21 mg Transdermal Daily   pantoprazole (PROTONIX) IV  40 mg Intravenous Q12H   sevelamer carbonate  800 mg Oral TID WC   sodium chloride flush  3 mL Intravenous Q12H   torsemide  100 mg Oral Daily    have reviewed scheduled and prn medications.  Physical Exam: General:NAD, comfortable Heart:RRR, s1s2 nl Lungs:clear b/l, no crackle Abdomen:soft, Non-tender, non-distended Extremities:No edema Dialysis Access: AV fistula has a good thrill and bruit.  Caitlan Chauca Prasad Joud Pettinato 06/15/2023,12:25 PM  LOS: 2 days

## 2023-06-15 NOTE — Evaluation (Signed)
 Physical Therapy Evaluation Patient Details Name: [name] MRN: [id number] DOB: February 14, 1961 Today's Date: 06/15/2023  History of Present Illness  63 yo female presents to [facility] 06/11/23 with pain, increased wheezing, and SOB with exertion. Hospitalized 2/20-2/25 for GI bleed and 2/28-3/2 for covid and left AMA. Pt with anemia and given transfusion. PMH includes ESRD with noncompliance with HD, DMII, COPD, PAD, GERD, GIB, AVM, CHF, anemia, polysubstance abuse, HLD, hep C, tobacco use disorder and homelessness.  Clinical Impression  Pt admitted with above diagnosis. Pt reports that she has been staying with a woman that she met on the street and will go back there to get her things but seemed unsure whether she would continue to stay there or not. Pt has been using cane for ambulation outside, no AD in home. On eval pt desat to 85% with ambulation on RA and was more unsteady when off O2. Remained 93% when ambulating on 4L O2. Will follow pt acutely but no recs for PT after d/c.  Pt currently with functional limitations due to the deficits listed below (see PT Problem List). Pt will benefit from acute skilled PT to increase their independence and safety with mobility to allow discharge.           If plan is discharge home, recommend the following: Assistance with cooking/housework;Assist for transportation;Help with stairs or ramp for entrance   Can travel by private vehicle        Equipment Recommendations None recommended by PT  Recommendations for Other Services       Functional Status Assessment Patient has had a recent decline in their functional status and demonstrates the ability to make significant improvements in function in a reasonable and predictable amount of time.     Precautions / Restrictions Precautions Precautions: Fall Recall of Precautions/Restrictions: Intact Precaution/Restrictions Comments: Contact precs Restrictions Weight Bearing Restrictions Per Provider Order:  No      Mobility  Bed Mobility Overal bed mobility: Needs Assistance Bed Mobility: Supine to Sit, Sit to Supine     Supine to sit: Supervision Sit to supine: Supervision   General bed mobility comments: increased time but no physical assist    Transfers Overall transfer level: Needs assistance Equipment used: None Transfers: Sit to/from Stand Sit to Stand: Supervision           General transfer comment: supervision for safety    Ambulation/Gait Ambulation/Gait assistance: Contact guard assist Gait Distance (Feet): 300 Feet Assistive device: None (wall rail) Gait Pattern/deviations: Step-through pattern, Narrow base of support, Decreased dorsiflexion - left, Decreased dorsiflexion - right Gait velocity: decreased Gait velocity interpretation: >2.62 ft/sec, indicative of community ambulatory   General Gait Details: first 150' pt ambulated on RA with sats dropping to 85% and pt reported increased pain in B ankles. She was also more unsteady than next 150' with 4L O2 and sats remaining 93%.  Stairs            Wheelchair Mobility     Tilt Bed    Modified Rankin (Stroke Patients Only)       Balance Overall balance assessment: Needs assistance Sitting-balance support: Feet supported, No upper extremity supported Sitting balance-Leahy Scale: Good     Standing balance support: No upper extremity supported, During functional activity Standing balance-Leahy Scale: Poor Standing balance comment: unsteady when performing balance activities on RA  Pertinent Vitals/Pain Pain Assessment Pain Assessment: Faces Faces Pain Scale: Hurts even more Pain Location: ankles Pain Descriptors / Indicators: Aching, Discomfort Pain Intervention(s): Limited activity within patient's tolerance, Monitored during session    Home Living Family/patient expects to be discharged to:: Private residence Living Arrangements:  Non-relatives/Friends               Home Equipment: [name] - single point Additional Comments: pt reports she has been living with a woman that she met on the street and helping that woman out. She is unsure if that is where she will go when she d/c    Prior Function Prior Level of Function : Independent/Modified Independent             Mobility Comments: uses cane for out of home ADLs Comments: independent     Extremity/Trunk Assessment   Upper Extremity Assessment Upper Extremity Assessment: Generalized weakness    Lower Extremity Assessment Lower Extremity Assessment: Generalized weakness    Cervical / Trunk Assessment Cervical / Trunk Assessment: Normal  Communication   Communication Communication: No apparent difficulties    Cognition Arousal: Alert Behavior During Therapy: WFL for tasks assessed/performed   PT - Cognitive impairments: Safety/Judgement, Attention                       PT - Cognition Comments: despite education pt reports that she has no plans to continue to do regular HD but she reports that she can tell when she needs it and just comes to the hospital each time that happens. Very low health literacy with additional denial tendency Following commands: Intact       Cueing Cueing Techniques: Verbal cues     General Comments General comments (skin integrity, edema, etc.): SPO2 96% at rest on 4L O2, 85% with ambulation on RA, 93% with ambulation on 4L    Exercises     Assessment/Plan    PT Assessment Patient needs continued PT services  PT Problem List Decreased strength;Decreased activity tolerance;Decreased balance;Decreased mobility;Decreased knowledge of use of DME;Decreased safety awareness       PT Treatment Interventions DME instruction;Gait training;Stair training;Functional mobility training;Therapeutic activities;Therapeutic exercise;Balance training;Patient/family education;Cognitive remediation    PT Goals  (Current goals can be found in the Care Plan section)  Acute Rehab PT Goals Patient Stated Goal: get off the O2 PT Goal Formulation: With patient Time For Goal Achievement: 06/29/23 Potential to Achieve Goals: Fair    Frequency Min 2X/week     [name]-evaluation               AM-PAC PT "6 Clicks" Mobility  Outcome Measure Help needed turning from your back to your side while in a flat bed without using bedrails?: None Help needed moving from lying on your back to sitting on the side of a flat bed without using bedrails?: None Help needed moving to and from a bed to a chair (including a wheelchair)?: A Little Help needed standing up from a chair using your arms (e.g., wheelchair or bedside chair)?: A Little Help needed to walk in hospital room?: A Little Help needed climbing 3-5 steps with a railing? : A Lot 6 Click Score: 19    End of Session Equipment Utilized During Treatment: Gait belt;Oxygen Activity Tolerance: Patient tolerated treatment well Patient left: in bed;with call bell/phone within reach Nurse Communication: Mobility status PT Visit Diagnosis: Unsteadiness on feet (R26.81);Muscle weakness (generalized) (M62.81)    Time: 9604-5409 PT Time Calculation (min) (ACUTE ONLY):  24 min   Charges:   PT Evaluation $PT Eval Moderate Complexity: 1 Mod PT Treatments $Gait Training: 8-22 mins PT General Charges $$ ACUTE PT VISIT: 1 Visit         [name], PT  Acute Rehab Services Secure chat preferred Office [phone]   [name] 06/15/2023, 3:05 PM

## 2023-06-15 NOTE — Progress Notes (Signed)
 PROGRESS NOTE                                                                                                                                                                                                             Patient Demographics:    [name], is a 63 y.o. female, DOB - 12/28/1960, WUJ:[id number]  Outpatient Primary MD for the patient is [name], MD    LOS - 2  Admit date - 06/12/2023    Chief Complaint  Patient presents with   Shortness of Breath       Brief Narrative (HPI from H&P)   63 y.o. female with medical history significant of hypertension, hyperlipidemia, GERD, PUD, gastritis, GI bleed, hepatitis C, pleural effusion, substance use, anxiety, depression, COPD, anemia, chronic pain, ESRD with refusal of HD presenting with shortness of breath.   Patient recently admitted 2/20-2/25 with GI bleed and progression to ESRD but patient declined HD during that visit and was given Lasix as needed.  Patient readmitted 2/28-3/2.  Admitted to critical care in the ICU due to significant oxygen requirement/hypoxia in the setting of volume overload.  At that time she was agreeable to 2 sessions of dialysis and was weaning down on her oxygen but ultimately left AMA.   She presents again after being seen by her PCP and found to be anemic.  She was seen for shortness of breath and concern for worsening hemoglobin and her hemoglobin was down to 6.7 which is a 2 g drop in 3 weeks with hemoglobin of 8.93 weeks ago.  Patient has some chronic dark stools due to being on iron as well as some chronic intermittent abdominal pain.  He was also found to have fluid overload and worsening renal function.  She was admitted for further care.    Subjective:   Patient in bed, appears comfortable, denies any headache, no fever, no chest pain or pressure, no shortness of breath , no abdominal pain. No focal weakness.   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  Both GI and nephrology been consulted, no signs of ongoing  brisk bleeding, on PPI, for now IV diuretics, GI has signed off supportive care to continue.  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 She was discharged on 2/25 with H/H 9.2/26.    ESRD  Due to financial issues, homeless situation and transportation issues she has been declining HD in the past, currently she is agreeable to HD treatment nephrology consulted see above.  Patient has been refusing HD treatments here despite counseling, if she continues to refuse we will call palliative care for goals of care.    Hypertension - diuretics per nephrology, Norvasc.  Monitor   Hyperlipidemia  - Continue home atorvastatin   GERD PUD  Gastritis  - does have anemia Hemoccult positive, no need for transfusion continue monitor CBC, iron and folic acid supplementation, PPI, GI on board EGD once respiratory status improves and fluid is removed via HD.   Substance use  - Noted, counseled to quit.   Anxiety Depression  - Continue home BuSpar and trazodone   COPD  - Replace home Trelegy with formulary Breo and Incruse  As needed albuterol   Chronic pain - Treat as needed        Condition -   Guarded  Family Communication  :   None  Code Status :  DNR  Consults  :  GI, Renal  PUD Prophylaxis :     Procedures  :            Disposition Plan  :    Status is: Observation   DVT Prophylaxis  :    SCDs Start: 06/12/23 1422    Lab Results  Component Value Date   PLT 229 06/15/2023     Diet :  Diet Order             Diet renal with fluid restriction Fluid restriction: 1500 mL Fluid; Room service appropriate? Yes; Fluid consistency: Thin  Diet effective now                    Inpatient Medications  Scheduled Meds:  sodium chloride   Intravenous Once   sodium chloride   Intravenous Once   amLODipine  10 mg Oral Daily   atorvastatin  10 mg Oral Daily   Chlorhexidine Gluconate Cloth  6 each Topical Q0600   darbepoetin (ARANESP) injection - DIALYSIS  100 mcg Subcutaneous Q Fri-1800   ferrous sulfate  325 mg Oral BID WC   fluticasone furoate-vilanterol  1 puff Inhalation Daily   And   umeclidinium bromide  1 puff Inhalation Daily   folic acid  1 mg Oral Daily   nicotine  21 mg Transdermal Daily   pantoprazole (PROTONIX) IV  40 mg Intravenous Q12H   sevelamer carbonate  800 mg Oral TID WC   sodium chloride flush  3 mL Intravenous Q12H   torsemide  100 mg Oral Daily   Continuous Infusions:  iron sucrose Stopped (06/14/23 1810)   PRN Meds:.acetaminophen **OR** acetaminophen, albuterol, hydrALAZINE, polyethylene glycol, traMADol, traZODone  Antibiotics  :    Anti-infectives (From admission, onward)    None         Objective:   Vitals:   06/14/23 1320 06/14/23 1959 06/15/23 0400 06/15/23 0756  BP: (!) 163/75  (!) 140/65 (!) 158/70  Pulse: 85 91  84  Resp: 13 20  14 20  Temp: 97.9 F (36.6 C) 98.8 F (37.1 C) 97.7 F (36.5 C)   TempSrc: Oral Oral Oral   SpO2: 96%     Weight:      Height:        Wt Readings from Last 3 Encounters:  06/14/23 53.8 kg  06/11/23 54 kg  05/17/23 59.9 kg     Intake/Output Summary (Last 24 hours) at 06/15/2023 0854 Last data filed at 06/14/2023 1716 Gross per 24 hour  Intake 453.33 ml  Output 1000 ml  Net -546.67 ml     Physical Exam  Awake Alert, No new F.N deficits, absolutely in no distress having breakfast comfortably [place].AT,PERRAL Supple Neck, No JVD,   Symmetrical Chest wall movement, Mod  air movement bilaterally, bibasilar crackles RRR,No Gallops,Rubs or new Murmurs,  +ve B.Sounds, Abd Soft, No tenderness,   No Cyanosis, Clubbing or edema     RN pressure injury documentation: Pressure Injury 11/10/22 Coccyx Mid Stage 2 -  Partial thickness loss of dermis presenting as a shallow open injury with a red, pink wound bed without slough. (Active)  11/10/22 0205  Location: Coccyx  Location Orientation: Mid  Staging: Stage 2 -  Partial thickness loss of dermis presenting as a shallow open injury with a red, pink wound bed without slough.  Wound Description (Comments):   Present on Admission: Yes     Pressure Injury 11/10/22 Buttocks Left Stage 2 -  Partial thickness loss of dermis presenting as a shallow open injury with a red, pink wound bed without slough. (Active)  11/10/22 0205  Location: Buttocks  Location Orientation: Left  Staging: Stage 2 -  Partial thickness loss of dermis presenting as a shallow open injury with a red, pink wound bed without slough.  Wound Description (Comments):   Present on Admission: Yes     Pressure Injury 05/16/23 Buttocks Left Stage 2 -  Partial thickness loss of dermis presenting as a shallow open injury with a red, pink wound bed without slough. (Active)  05/16/23 1600  Location: Buttocks  Location Orientation: Left  Staging: Stage 2 -  Partial thickness loss of dermis presenting as a shallow open injury with a red, pink wound bed without slough.  Wound Description (Comments):   Present on Admission: Yes      Data Review:    Recent Labs  Lab 06/11/23 1218 06/12/23 1108 06/12/23 1108 06/12/23 2231 06/13/23 0526 06/13/23 1555 06/14/23 0517 06/14/23 1543 06/15/23 0525  WBC 6.0 7.0   < > 7.1 6.1 7.3 6.2  --  5.2  HGB 6.7* 6.4*   < > 7.5* 7.4* 7.4* 6.6* 9.2* 10.0*  HCT 20.4* 20.4*   < > 24.0* 23.6* 22.9* 20.8* 27.2* 29.9*  PLT 294 281   < > 240 229 251 240  --  229  MCV 96 101.5*   < > 95.6 95.5 93.5 94.1  --  89.5  MCH 31.6  31.8   < > 29.9 30.0 30.2 29.9  --  29.9  MCHC 32.8 31.4   < > 31.3 31.4 32.3 31.7  --  33.4  RDW 16.9* 20.0*   < > 22.4* 22.9* 22.8* 22.3*  --  19.6*  LYMPHSABS 1.1 1.0  --   --   --   --  1.3  --  0.9  MONOABS  --  0.7  --   --   --   --  0.7  --  0.7  EOSABS 0.2 0.1  --   --   --   --  0.2  --  0.2  BASOSABS 0.0 0.0  --   --   --   --  0.0  --  0.0   < > = values in this interval not displayed.    Recent Labs  Lab 06/12/23 1104 06/13/23 0526 06/14/23 0517 06/15/23 0525  NA 135 136 134* 132*  K 5.0 5.7* 4.8 4.3  CL 108 107 105 103  CO2 15* 13* 14* 21*  ANIONGAP 12 16* 15 8  GLUCOSE 96 88 95 103*  BUN 67* 70* 77* 53*  CREATININE 5.52* 5.73* 6.10* 4.45*  AST 23 16  --   --   ALT 19 15  --   --   ALKPHOS 93 79  --   --   BILITOT 0.7 0.5  --   --   ALBUMIN 2.9* 2.4*  --   --   PHOS  --   --  6.7*  --   CALCIUM 8.4* 8.2* 8.0* 8.0*      Recent Labs  Lab 06/12/23 1104 06/13/23 0526 06/14/23 0517 06/15/23 0525  CALCIUM 8.4* 8.2* 8.0* 8.0*    --------------------------------------------------------------------------------------------------------------- Lab Results  Component Value Date   CHOL 135 10/25/2022   HDL 43 10/25/2022   LDLCALC 70 10/25/2022   TRIG 111 10/25/2022   CHOLHDL 3.1 10/25/2022    Lab Results  Component Value Date   HGBA1C 5.0 05/16/2023   No results for input(s): "TSH", "T4TOTAL", "FREET4", "T3FREE", "THYROIDAB" in the last 72 hours. Recent Labs    06/13/23 0802  VITAMINB12 514  FOLATE 30.3  FERRITIN 451*  TIBC 217*  IRON 22*  RETICCTPCT 2.1   ------------------------------------------------------------------------------------------------------------------ Cardiac Enzymes No results for input(s): "CKMB", "TROPONINI", "MYOGLOBIN" in the last 168 hours.  Invalid input(s): "CK"  Micro Results No results found for this or any previous visit (from the past 240 hours).  Radiology Report No results found.    Signature  -    Susa Raring M.D on 06/15/2023 at 8:54 AM   -  To page go to [email or web address]

## 2023-06-15 NOTE — Progress Notes (Signed)
 Additional PT Note:  SATURATION QUALIFICATIONS: (This note is used to comply with regulatory documentation for home oxygen)  Patient Saturations on Room Air at Rest = 94%  Patient Saturations on Room Air while Ambulating = 85%  Patient Saturations on 4 Liters of oxygen while Ambulating = 93%  Please briefly explain why patient needs home oxygen:When ambulating on RA, pt desats to 85% with increased SOB and worsening balance. Sats remain in low 90's when ambulating on 4L O2.   Lyanne Co, PT  Acute Rehab Services Secure chat preferred Office [phone]

## 2023-06-16 DIAGNOSIS — N185 Chronic kidney disease, stage 5: Secondary | ICD-10-CM

## 2023-06-16 DIAGNOSIS — N179 Acute kidney failure, unspecified: Secondary | ICD-10-CM | POA: Diagnosis not present

## 2023-06-16 LAB — CBC WITH DIFFERENTIAL/PLATELET
Abs Immature Granulocytes: 0.09 10*3/uL — ABNORMAL HIGH (ref 0.00–0.07)
Basophils Absolute: 0 10*3/uL (ref 0.0–0.1)
Basophils Relative: 1 %
Eosinophils Absolute: 0.2 10*3/uL (ref 0.0–0.5)
Eosinophils Relative: 3 %
HCT: 26.6 % — ABNORMAL LOW (ref 36.0–46.0)
Hemoglobin: 8.9 g/dL — ABNORMAL LOW (ref 12.0–15.0)
Immature Granulocytes: 2 %
Lymphocytes Relative: 15 %
Lymphs Abs: 0.8 10*3/uL (ref 0.7–4.0)
MCH: 30.1 pg (ref 26.0–34.0)
MCHC: 33.5 g/dL (ref 30.0–36.0)
MCV: 89.9 fL (ref 80.0–100.0)
Monocytes Absolute: 0.8 10*3/uL (ref 0.1–1.0)
Monocytes Relative: 15 %
Neutro Abs: 3.4 10*3/uL (ref 1.7–7.7)
Neutrophils Relative %: 64 %
Platelets: 235 10*3/uL (ref 150–400)
RBC: 2.96 MIL/uL — ABNORMAL LOW (ref 3.87–5.11)
RDW: 19 % — ABNORMAL HIGH (ref 11.5–15.5)
WBC: 5.3 10*3/uL (ref 4.0–10.5)
nRBC: 0.4 % — ABNORMAL HIGH (ref 0.0–0.2)

## 2023-06-16 LAB — BASIC METABOLIC PANEL WITH GFR
Anion gap: 11 (ref 5–15)
BUN: 58 mg/dL — ABNORMAL HIGH (ref 8–23)
CO2: 20 mmol/L — ABNORMAL LOW (ref 22–32)
Calcium: 8.1 mg/dL — ABNORMAL LOW (ref 8.9–10.3)
Chloride: 101 mmol/L (ref 98–111)
Creatinine, Ser: 5.03 mg/dL — ABNORMAL HIGH (ref 0.44–1.00)
GFR, Estimated: 9 mL/min — ABNORMAL LOW (ref >60)
Glucose, Bld: 93 mg/dL (ref 70–99)
Potassium: 4.1 mmol/L (ref 3.5–5.1)
Sodium: 132 mmol/L — ABNORMAL LOW (ref 135–145)

## 2023-06-16 NOTE — Progress Notes (Addendum)
 Pt's case discussed with nephrologist. Pt informed nephrologist that pt does not plan to continue out-pt HD at d/c. Pt has been accepted at [facility] and [facility] in the past and pt would not go to either clinic for HD treatments. The last time a referral was made for out-pt HD, pt was an area denial due to non-compliance. Pt has not been agreeable to out-pt HD for quite some time. Pt is homeless and has no transportation (transportation has been arranged in the past by hospital staff and clinic staff when pt was living in a motel and pt would still not attend treatments). Should pt become agreeable to out-pt HD, then referrals can be made for review.   Olivia Canter Renal Navigator [phone]  Addendum at 11:21 am: Received a call from Raven, RN at [facility], due to pt contacting her regarding concerns about d/c plans. Contacted TOC staff to make them aware of Raven's call and pt having concerns about d/c plans.

## 2023-06-16 NOTE — Plan of Care (Signed)

## 2023-06-16 NOTE — Progress Notes (Signed)
   06/16/23 1324  Vitals  Pulse Rate 91  Resp 15  BP (!) 169/69  SpO2 90 %  O2 Device Nasal Cannula  Weight 54.9 kg  Oxygen Therapy  O2 Flow Rate (L/min) 4 L/min  Patient Activity (if Appropriate) In bed  Pulse Oximetry Type Continuous  Oximetry Probe Site Changed No  Post Treatment  Dialyzer Clearance Lightly streaked  Liters Processed 37.5  Fluid Removed (mL) 1.5 mL  Tolerated HD Treatment Yes  AVG/AVF Arterial Site Held (minutes) 10 minutes  AVG/AVF Venous Site Held (minutes) 10 minutes   Received patient in bed to unit.  Alert and oriented.  Informed consent signed and in chart.   TX duration: Two hours and thirty minutes.  Patient tolerated well.  Transported back to the room  Alert, without acute distress.  Hand-off given to patient's nurse.   Access used: Right upper arm fistula Access issues: None  Medication(s) given: Acetaminophen 650mg .
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
## 2023-06-16 NOTE — TOC CM/SW Note (Signed)
 Transition of Care [facility]) - Inpatient Brief Assessment   Patient Details  Name: [name] MRN: [id number] Date of Birth: [birth year]  Transition of Care [facility]) CM/SW Contact:    Mearl Latin, LCSW Phone Number: 06/16/2023, 8:54 AM   Clinical Narrative: Patient admitted with respiratory failure. She has been staying with acquaintances. CSW following for SDOH resources.     Transition of Care Asessment: Insurance and Status: Insurance coverage has been reviewed Patient has primary care physician: Yes Home environment has been reviewed: Homeless, staying with friends Prior level of function:: Audiological scientist Home Services: No current home services Social Drivers of Health Review: SDOH reviewed needs interventions Readmission risk has been reviewed: Yes Transition of care needs: transition of care needs identified, TOC will continue to follow

## 2023-06-16 NOTE — Progress Notes (Signed)
 [name]  Assessment/ Plan: Pt is a 63 y.o. yo female  with past medical history significant for hypertension, HLD, hep C, pleural effusion, polysubstance abuse, anxiety depression, COPD, ESRD and refusing dialysis, anemia presented with worsening shortness of breath, seen as a consultation for the management of ESRD   # ESRD with refusal of dialysis in the past: Patient with signs or symptoms of uremia, fluid overload, hyperkalemia, anemia.  First HD on 3/29.  HD today.  We talked today and she has no plans to continue outpt HD - says she refuses it and also is homeless and no transportation.  D/w renal navigator - h/o multiple attempts to arrange outpt HD, transportation, housing and pt did not follow through with treatments.  Will no pursue outpt HD arrangements at this time as she says she will not go. Will monitor.    # Anemia: Presumably due to ESRD: Iron saturation only 10% therefore started IV iron and Aranesp.  Receiving blood transfusion as well.   # Hypertension/volume: On antihypertensives, dialysis should help.   # CKD-MBD/hyperphosphatemia:  PTH 555, started calcitriol and sevelamer for hyperphosphatemia.  # Metabolic acidosis due to ESRD; discontinued oral sodium bicarbonate since patient is going to HD.  Would resume at d/c given no HD.  #hyperkalemia: on presentation now normal.  Recent serum K normal.  Don't think she needs K binder at d/c, just renal diet  OK for d/c from nephrology perspective, will continue to follow.  Subjective: Seen and examined on dialysis.  Tolerating it well but says she doesn't plan to   Reports feeling much better today.  Denies nausea, vomiting, chest pain, shortness of breath.  Objective Vital signs in last 24 hours: Vitals:   06/16/23 0406 06/16/23 0825 06/16/23 0955 06/16/23 1015  BP: (!) 134/57  (!) 156/61 (!) 165/71  Pulse: 79 92 92 88  Resp:  13 (!) 22 16  Temp: 98.7 F (37.1 C)  98.6 F (37  C)   TempSrc: Oral     SpO2: 92% 97% 95% 96%  Weight:   58.5 kg   Height:       Weight change: -0.6 kg No intake or output data in the 24 hours ending 06/16/23 1027      Labs: RENAL PANEL Recent Labs  Lab 06/12/23 1104 06/13/23 0526 06/14/23 0517 06/15/23 0525 06/16/23 0419  NA 135 136 134* 132* 132*  K 5.0 5.7* 4.8 4.3 4.1  CL 108 107 105 103 101  CO2 15* 13* 14* 21* 20*  GLUCOSE 96 88 95 103* 93  BUN 67* 70* 77* 53* 58*  CREATININE 5.52* 5.73* 6.10* 4.45* 5.03*  CALCIUM 8.4* 8.2* 8.0* 8.0* 8.1*  PHOS  --   --  6.7*  --   --   ALBUMIN 2.9* 2.4*  --   --   --     Liver Function Tests: Recent Labs  Lab 06/12/23 1104 06/13/23 0526  AST 23 16  ALT 19 15  ALKPHOS 93 79  BILITOT 0.7 0.5  PROT 6.9 6.0*  ALBUMIN 2.9* 2.4*   No results for input(s): "LIPASE", "AMYLASE" in the last 168 hours. No results for input(s): "AMMONIA" in the last 168 hours. CBC: Recent Labs    08/13/22 1191 08/13/22 1546 10/25/22 1351 10/25/22 1354 10/25/22 2053 10/26/22 0622 01/23/23 2350 01/24/23 0740 04/15/23 1900 04/16/23 4782 06/13/23 0802 06/13/23 1555 06/14/23 0517 06/14/23 1543 06/15/23 0525 06/16/23 0419  HGB  --    < >  6.5*   < > 8.1*   < >  --    < >  --    < >  --  7.4* 6.6* 9.2* 10.0* 8.9*  MCV  --    < > 100.0  --  94.9   < >  --    < >  --    < >  --  93.5 94.1  --  89.5 89.9  VITAMINB12 507  --  420  --   --   --  715  --  387  --  514  --   --   --   --   --   FOLATE 8.5  --   --   --  8.1  --  21.4  --  >40.0  --  30.3  --   --   --   --   --   FERRITIN 160  --   --   --  187  --  488*  --  269  --  451*  --   --   --   --   --   TIBC 280  --   --   --  200*  --  269  --  297  --  217*  --   --   --   --   --   IRON 21*  --   --   --  7*  --  86  --  24*  --  22*  --   --   --   --   --   RETICCTPCT 2.2  --   --   --  2.2  --  5.8*  --  2.9  --  2.1  --   --   --   --   --    < > = values in this interval not displayed.    Cardiac Enzymes: No results for  input(s): "CKTOTAL", "CKMB", "CKMBINDEX", "TROPONINI" in the last 168 hours. CBG: No results for input(s): "GLUCAP" in the last 168 hours.  Iron Studies:  No results for input(s): "IRON", "TIBC", "TRANSFERRIN", "FERRITIN" in the last 72 hours.  Studies/Results: No results found.   Medications: Infusions:    Scheduled Medications:  sodium chloride   Intravenous Once   sodium chloride   Intravenous Once   amLODipine  10 mg Oral Daily   atorvastatin  10 mg Oral Daily   benzonatate  200 mg Oral BID   busPIRone  10 mg Oral BID   calcitRIOL  0.25 mcg Oral Daily   Chlorhexidine Gluconate Cloth  6 each Topical Q0600   darbepoetin (ARANESP) injection - DIALYSIS  100 mcg Subcutaneous Q Fri-1800   ferrous sulfate  325 mg Oral BID WC   fluticasone furoate-vilanterol  1 puff Inhalation Daily   And   umeclidinium bromide  1 puff Inhalation Daily   folic acid  1 mg Oral Daily   gabapentin  100 mg Oral TID   nicotine  21 mg Transdermal Daily   pantoprazole (PROTONIX) IV  40 mg Intravenous Q12H   sevelamer carbonate  800 mg Oral TID WC   sodium chloride flush  3 mL Intravenous Q12H   torsemide  100 mg Oral Daily    have reviewed scheduled and prn medications.  Physical Exam: General:NAD, comfortable Heart:RRR, s1s2 nl Lungs:clear b/l, no crackle Abdomen:soft, Non-tender, non-distended Extremities:No edema Dialysis Access: AV fistula has a good thrill and bruit.   Tyler Pita 06/16/2023,10:27  AM  LOS: 3 days

## 2023-06-16 NOTE — TOC Progression Note (Signed)
 Transition of Care [facility]) - Progression Note    Patient Details  Name: [name] MRN: [id number] Date of Birth: September 12, 1960  Transition of Care [facility]) CM/SW Contact  Gordy Clement, RN Phone Number: 06/16/2023, 1:23 PM  Clinical Narrative:   +  Chart review. RNCM also attempted to meet with patient on unit. She is off the floor in HD  Will re attempt          Expected Discharge Plan and Services                                               Social Determinants of Health (SDOH) Interventions SDOH Screenings   Food Insecurity: Food Insecurity Present (06/12/2023)  Housing: High Risk (06/12/2023)  Transportation Needs: Unmet Transportation Needs (06/12/2023)  Utilities: At Risk (06/12/2023)  Alcohol Screen: Low Risk  (02/10/2023)  Depression (PHQ2-9): Medium Risk (06/11/2023)  Financial Resource Strain: High Risk (02/10/2023)  Physical Activity: Inactive (02/10/2023)  Social Connections: Socially Isolated (06/12/2023)  Tobacco Use: High Risk (06/12/2023)  Health Literacy: Adequate Health Literacy (02/10/2023)    Readmission Risk Interventions    01/28/2023   10:32 AM 01/27/2023    4:13 PM 11/13/2022    3:21 PM  Readmission Risk Prevention Plan  Transportation Screening Complete Complete   Medication Review Oceanographer) Complete Complete   PCP or Specialist appointment within 3-5 days of discharge  Complete   HRI or Home Care Consult  Complete --  SW Recovery Care/Counseling Consult  Complete   Palliative Care Screening  Not Applicable   Skilled Nursing Facility  Not Applicable

## 2023-06-16 NOTE — Progress Notes (Signed)
 PROGRESS NOTE                                                                                                                                                                                                             Patient Demographics:    [name], is a 63 y.o. female, DOB - [birth year], JXB:[id number]  Outpatient Primary MD for the patient is [name], MD    LOS - 3  Admit date - 06/12/2023    Chief Complaint  Patient presents with   Shortness of Breath       Brief Narrative (HPI from H&P)   63 y.o. female with medical history significant of hypertension, hyperlipidemia, GERD, PUD, gastritis, GI bleed, hepatitis C, pleural effusion, substance use, anxiety, depression, COPD, anemia, chronic pain, ESRD with refusal of HD presenting with shortness of breath.   Patient recently admitted 2/20-2/25 with GI bleed and progression to ESRD but patient declined HD during that visit and was given Lasix as needed.  Patient readmitted 2/28-3/2.  Admitted to critical care in the ICU due to significant oxygen requirement/hypoxia in the setting of volume overload.  At that time she was agreeable to 2 sessions of dialysis and was weaning down on her oxygen but ultimately left AMA.   She presents again after being seen by her PCP and found to be anemic.  She was seen for shortness of breath and concern for worsening hemoglobin and her hemoglobin was down to 6.7 which is a 2 g drop in 3 weeks with hemoglobin of 8.93 weeks ago.  Patient has some chronic dark stools due to being on iron as well as some chronic intermittent abdominal pain.  He was also found to have fluid overload and worsening renal function.  She was admitted for further care.    Subjective:   Patient in bed, appears comfortable, denies any headache, no fever, no chest pain or pressure, no shortness of breath , no abdominal pain. No focal weakness.   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  Both GI and nephrology been consulted, no signs of ongoing  brisk bleeding, on PPI, for now IV diuretics, GI has signed off supportive care to continue.  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 She was discharged on 2/25 with H/H 9.2/26.    ESRD  Due to financial issues, homeless situation and transportation issues she has been declining HD in the past, currently she is agreeable to HD treatment nephrology consulted see above.  Patient has been refusing HD treatments here despite counseling, she says she will get 1 HD treatment on 06/16/2023 thereafter she wants to go home tomorrow, she says she will think about outpatient HD treatments as she seems appropriate, realizes that if she does not undergo HD treatments it might end up causing stroke disability or death.  Fully aware of the consequences and assumes all responsibility for the same.    Hypertension - diuretics per nephrology, Norvasc.  Monitor   Hyperlipidemia  - Continue home atorvastatin   GERD PUD  Gastritis  - does have anemia Hemoccult positive, no need for transfusion continue monitor CBC, iron and folic acid supplementation, PPI, GI on board EGD once respiratory status improves and fluid is removed via HD.   Substance use  - Noted, counseled to quit.   Anxiety Depression  - Continue home BuSpar and trazodone   COPD  - Replace home Trelegy with formulary Breo and Incruse  As needed albuterol   Chronic pain - Treat as needed        Condition -   Guarded  Family Communication  :    None  Code Status :  DNR  Consults  :  GI, Renal  PUD Prophylaxis :     Procedures  :            Disposition Plan  :    Status is: Observation   DVT Prophylaxis  :    SCDs Start: 06/12/23 1422    Lab Results  Component Value Date   PLT 235 06/16/2023    Diet :  Diet Order             Diet renal with fluid restriction Fluid restriction: 1500 mL Fluid; Room service appropriate? Yes; Fluid consistency: Thin  Diet effective now                    Inpatient Medications  Scheduled Meds:  sodium chloride   Intravenous Once   sodium chloride   Intravenous Once   amLODipine  10 mg Oral Daily   atorvastatin  10 mg Oral Daily   benzonatate  200 mg Oral BID   busPIRone  10 mg Oral BID   calcitRIOL  0.25 mcg Oral Daily   Chlorhexidine Gluconate Cloth  6 each Topical Q0600   darbepoetin (ARANESP) injection - DIALYSIS  100 mcg Subcutaneous Q Fri-1800   ferrous sulfate  325 mg Oral BID WC   fluticasone furoate-vilanterol  1 puff Inhalation Daily   And   umeclidinium bromide  1 puff Inhalation Daily   folic acid  1 mg Oral Daily   gabapentin  100 mg Oral TID   nicotine  21 mg Transdermal Daily   pantoprazole (PROTONIX) IV  40 mg Intravenous Q12H   sevelamer carbonate  800 mg Oral TID WC   sodium chloride flush  3 mL Intravenous Q12H   torsemide  100 mg Oral  Daily   Continuous Infusions:   PRN Meds:.acetaminophen **OR** acetaminophen, albuterol, guaiFENesin-dextromethorphan, hydrALAZINE, hydrOXYzine, polyethylene glycol, traMADol, traZODone  Antibiotics  :    Anti-infectives (From admission, onward)    None         Objective:   Vitals:   06/15/23 2039 06/15/23 2331 06/16/23 0406 06/16/23 0825  BP: (!) 160/63 (!) 149/61 (!) 134/57   Pulse:  92 79 92  Resp:    13  Temp: 98.7 F (37.1 C) 98.6 F (37 C) 98.7 F (37.1 C)   TempSrc: Oral Oral Oral   SpO2:  92% 92% 97%  Weight: 54.2 kg     Height:        Wt Readings from Last 3 Encounters:   06/15/23 54.2 kg  06/11/23 54 kg  05/17/23 59.9 kg    No intake or output data in the 24 hours ending 06/16/23 0940    Physical Exam  Awake Alert, No new F.N deficits, absolutely in no distress having breakfast comfortably [place].AT,PERRAL Supple Neck, No JVD,   Symmetrical Chest wall movement, Mod air movement bilaterally, bibasilar crackles RRR,No Gallops,Rubs or new Murmurs,  +ve B.Sounds, Abd Soft, No tenderness,   No Cyanosis, Clubbing or edema     RN pressure injury documentation: Pressure Injury 11/10/22 Coccyx Mid Stage 2 -  Partial thickness loss of dermis presenting as a shallow open injury with a red, pink wound bed without slough. (Active)  11/10/22 0205  Location: Coccyx  Location Orientation: Mid  Staging: Stage 2 -  Partial thickness loss of dermis presenting as a shallow open injury with a red, pink wound bed without slough.  Wound Description (Comments):   Present on Admission: Yes     Pressure Injury 11/10/22 Buttocks Left Stage 2 -  Partial thickness loss of dermis presenting as a shallow open injury with a red, pink wound bed without slough. (Active)  11/10/22 0205  Location: Buttocks  Location Orientation: Left  Staging: Stage 2 -  Partial thickness loss of dermis presenting as a shallow open injury with a red, pink wound bed without slough.  Wound Description (Comments):   Present on Admission: Yes     Pressure Injury 05/16/23 Buttocks Left Stage 2 -  Partial thickness loss of dermis presenting as a shallow open injury with a red, pink wound bed without slough. (Active)  05/16/23 1600  Location: Buttocks  Location Orientation: Left  Staging: Stage 2 -  Partial thickness loss of dermis presenting as a shallow open injury with a red, pink wound bed without slough.  Wound Description (Comments):   Present on Admission: Yes      Data Review:    Recent Labs  Lab 06/11/23 1218 06/12/23 1108 06/12/23 2231 06/13/23 0526 06/13/23 1555 06/14/23 0517  06/14/23 1543 06/15/23 0525 06/16/23 0419  WBC 6.0 7.0   < > 6.1 7.3 6.2  --  5.2 5.3  HGB 6.7* 6.4*   < > 7.4* 7.4* 6.6* 9.2* 10.0* 8.9*  HCT 20.4* 20.4*   < > 23.6* 22.9* 20.8* 27.2* 29.9* 26.6*  PLT 294 281   < > 229 251 240  --  229 235  MCV 96 101.5*   < > 95.5 93.5 94.1  --  89.5 89.9  MCH 31.6 31.8   < > 30.0 30.2 29.9  --  29.9 30.1  MCHC 32.8 31.4   < > 31.4 32.3 31.7  --  33.4 33.5  RDW 16.9* 20.0*   < > 22.9* 22.8* 22.3*  --  19.6* 19.0*  LYMPHSABS 1.1 1.0  --   --   --  1.3  --  0.9 0.8  MONOABS  --  0.7  --   --   --  0.7  --  0.7 0.8  EOSABS 0.2 0.1  --   --   --  0.2  --  0.2 0.2  BASOSABS 0.0 0.0  --   --   --  0.0  --  0.0 0.0   < > = values in this interval not displayed.    Recent Labs  Lab 06/12/23 1104 06/13/23 0526 06/14/23 0517 06/15/23 0525 06/16/23 0419  NA 135 136 134* 132* 132*  K 5.0 5.7* 4.8 4.3 4.1  CL 108 107 105 103 101  CO2 15* 13* 14* 21* 20*  ANIONGAP 12 16* 15 8 11   GLUCOSE 96 88 95 103* 93  BUN 67* 70* 77* 53* 58*  CREATININE 5.52* 5.73* 6.10* 4.45* 5.03*  AST 23 16  --   --   --   ALT 19 15  --   --   --   ALKPHOS 93 79  --   --   --   BILITOT 0.7 0.5  --   --   --   ALBUMIN 2.9* 2.4*  --   --   --   PHOS  --   --  6.7*  --   --   CALCIUM 8.4* 8.2* 8.0* 8.0* 8.1*      Recent Labs  Lab 06/12/23 1104 06/13/23 0526 06/14/23 0517 06/15/23 0525 06/16/23 0419  CALCIUM 8.4* 8.2* 8.0* 8.0* 8.1*    --------------------------------------------------------------------------------------------------------------- Lab Results  Component Value Date   CHOL 135 10/25/2022   HDL 43 10/25/2022   LDLCALC 70 10/25/2022   TRIG 111 10/25/2022   CHOLHDL 3.1 10/25/2022    Lab Results  Component Value Date   HGBA1C 5.0 05/16/2023   No results for input(s): "TSH", "T4TOTAL", "FREET4", "T3FREE", "THYROIDAB" in the last 72 hours. No results for input(s): "VITAMINB12", "FOLATE", "FERRITIN", "TIBC", "IRON", "RETICCTPCT" in the last 72  hours.  ------------------------------------------------------------------------------------------------------------------ Cardiac Enzymes No results for input(s): "CKMB", "TROPONINI", "MYOGLOBIN" in the last 168 hours.  Invalid input(s): "CK"  Micro Results No results found for this or any previous visit (from the past 240 hours).  Radiology Report No results found.    Signature  -   Susa Raring M.D on 06/16/2023 at 9:40 AM   -  To page go to [email or web address]

## 2023-06-17 ENCOUNTER — Other Ambulatory Visit (HOSPITAL_COMMUNITY): Payer: Self-pay

## 2023-06-17 ENCOUNTER — Telehealth: Payer: Self-pay

## 2023-06-17 ENCOUNTER — Other Ambulatory Visit: Payer: Self-pay

## 2023-06-17 DIAGNOSIS — D649 Anemia, unspecified: Secondary | ICD-10-CM | POA: Diagnosis not present

## 2023-06-17 MED ORDER — ATORVASTATIN CALCIUM 10 MG PO TABS
10.0000 mg | ORAL_TABLET | Freq: Every day | ORAL | 0 refills | Status: DC
  Filled 2023-06-17: qty 30, 30d supply, fill #0

## 2023-06-17 MED ORDER — HYDRALAZINE HCL 50 MG PO TABS
50.0000 mg | ORAL_TABLET | Freq: Three times a day (TID) | ORAL | Status: DC
  Administered 2023-06-17: 50 mg via ORAL
  Filled 2023-06-17: qty 1

## 2023-06-17 MED ORDER — SODIUM BICARBONATE 650 MG PO TABS
1300.0000 mg | ORAL_TABLET | Freq: Two times a day (BID) | ORAL | 0 refills | Status: DC
  Filled 2023-06-17: qty 60, 15d supply, fill #0

## 2023-06-17 MED ORDER — ALBUTEROL SULFATE (2.5 MG/3ML) 0.083% IN NEBU: 2.5000 mg | INHALATION_SOLUTION | Freq: Every day | RESPIRATORY_TRACT | Status: DC

## 2023-06-17 MED ORDER — TORSEMIDE 100 MG PO TABS
100.0000 mg | ORAL_TABLET | Freq: Every day | ORAL | 0 refills | Status: DC
Start: 2023-06-17 — End: 2023-07-01
  Filled 2023-06-17: qty 30, 30d supply, fill #0

## 2023-06-17 MED ORDER — AMLODIPINE BESYLATE 10 MG PO TABS
10.0000 mg | ORAL_TABLET | Freq: Every day | ORAL | 0 refills | Status: DC
  Filled 2023-06-17: qty 30, 30d supply, fill #0

## 2023-06-17 MED ORDER — PANTOPRAZOLE SODIUM 40 MG PO TBEC
40.0000 mg | DELAYED_RELEASE_TABLET | Freq: Two times a day (BID) | ORAL | 0 refills | Status: DC
  Filled 2023-06-17: qty 60, 30d supply, fill #0

## 2023-06-17 MED ORDER — TRAMADOL HCL 50 MG PO TABS: 50.0000 mg | ORAL_TABLET | Freq: Three times a day (TID) | ORAL | Status: AC | PRN

## 2023-06-17 NOTE — Discharge Summary (Signed)
 [name] WUJ:[id number] DOB: 05-Nov-1960 DOA: 06/12/2023  PCP: [name], MD  Admit date: 06/12/2023  Discharge date: 06/17/2023  Admitted From: Home   Disposition:  Home   Recommendations for Outpatient Follow-up:   Follow up with PCP in 1-2 weeks  PCP Please obtain BMP/CBC, 2 view CXR in 1week,  (see Discharge instructions)   PCP Please follow up on the following pending results:    Home Health: None   Equipment/Devices: None  Consultations: Nephrology Discharge Condition: Stable     CODE STATUS: Full     Diet Recommendation: Renal diet with strict 1.2 L fluid restriction per day  Diet Order             Diet renal with fluid restriction Fluid restriction: 1500 mL Fluid; Room service appropriate? Yes; Fluid consistency: Thin  Diet effective now                    Chief Complaint  Patient presents with   Shortness of Breath     Brief history of present illness from the day of admission and additional interim summary    63 y.o. female with medical history significant of hypertension, hyperlipidemia, GERD, PUD, gastritis, GI bleed, hepatitis C, pleural effusion, substance use, anxiety, depression, COPD, anemia, chronic pain, ESRD with refusal of HD presenting with shortness of breath.   Patient recently admitted 2/20-2/25 with GI bleed and progression to ESRD but patient declined HD during that visit and was given Lasix as needed.   Patient readmitted 2/28-3/2.  Admitted to critical care in the ICU due to significant oxygen requirement/hypoxia in the setting of volume overload.  At that time she was agreeable to 2 sessions of dialysis and was weaning down on her oxygen but ultimately left AMA.   She presents again after being seen by her PCP and found to be anemic.  She was seen for  shortness of breath and concern for worsening hemoglobin and her hemoglobin was down to 6.7 which is a 2 g drop in 3 weeks with hemoglobin of 8.93 weeks ago.  Patient has some chronic dark stools due to being on iron as well as some chronic intermittent abdominal pain.  He was also found to have fluid overload and worsening renal function.  She was admitted for further care.                                                                   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  Both GI and nephrology been consulted, no signs  of ongoing brisk bleeding, on PPI, for now IV diuretics, GI has signed off supportive care to continue.  Previous history of -   Push enteroscopy on 2/21 with Schatzki's ring, small hiatal hernia, antral gastritis with linear erosions, and multiple AVMs scattered in the stomach, duodenal bulb, D3, and proximal jejunum, all treated with APC. She was discharged on 2/25 with H/H 9.2/26.      ESRD  she has been declining HD in the outpatient setting, still does not want to do outpatient HD treatments on a regular basis, initially she said this was due to transportation but now gives a different excuse every time asked.  Patient has been refusing HD treatments here despite counseling, she says she will get 1 HD treatment on 06/16/2023 thereafter she wants to go home tomorrow, she says she will think about outpatient HD treatments as she seems appropriate, realizes that if she does not undergo HD treatments it might end up causing stroke disability or death.  Fully aware of the consequences and assumes all responsibility for the same.  Discussed with her again today, again adamant that she will get outpatient dialysis through ER if needed, does not want to commit herself  to outpatient dialysis regimen       Hypertension - diuretics per nephrology, Norvasc.  Monitor   Hyperlipidemia  - Continue home atorvastatin   GERD PUD  Gastritis  - does have anemia Hemoccult positive, no need for transfusion continue monitor CBC, iron and folic acid supplementation, PPI, GI wants outpatient GI follow-up.  Recommended to follow-up with [facility] postdischarge.   Substance use  - Noted, counseled to quit.   Anxiety Depression  - Continue home BuSpar and trazodone   COPD  -new home breathing treatments as before.   Chronic pain - Treat per home regimen.She also exhibits IV narcotic seeking behavior in the hospital.    Discharge diagnosis     Active Problems:   Acute on chronic anemia   Volume overload   Hepatitis C   Essential hypertension   Hyperlipidemia   Anxiety and depression   GERD (gastroesophageal reflux disease)   Polysubstance abuse (HCC)   Gastritis and gastroduodenitis   Pleural effusion   COPD (chronic obstructive pulmonary disease) (HCC)   Chronic pain syndrome   Angiodysplasia of upper gastrointestinal tract   ESRD (end stage renal disease) (HCC)   Acute on chronic respiratory failure with hypoxia (HCC)   AVM (arteriovenous malformation) of small bowel, acquired   Gastric AVM    Discharge instructions    Discharge Instructions     Discharge instructions   Complete by: As directed    Follow with Primary MD [name], MD in 7 days   Get CBC, CMP, 2 view Chest X ray -  checked next visit with your primary MD    Activity: As tolerated with Full fall precautions use walker/cane & assistance as needed  Disposition Home   Diet: Renal diet with strict 1.2 L fluid restriction per day  Special Instructions: If you have smoked or chewed Tobacco  in the last 2 yrs please stop smoking, stop any regular Alcohol  and or any Recreational drug use.  On your next visit with your primary care physician please Get Medicines reviewed  and adjusted.  Please request your Prim.MD to go over all Hospital Tests and Procedure/Radiological results at the follow up, please get all Hospital records sent to your Prim MD by signing hospital release before you go home.  If you experience worsening of your admission symptoms, develop shortness of breath, life threatening emergency, suicidal or homicidal thoughts you must seek medical attention immediately by calling 911 or calling your MD immediately  if symptoms less severe.  You Must read complete instructions/literature along with all the possible adverse reactions/side effects for all the Medicines you take and that have been prescribed to you. Take any new Medicines after you have completely understood and accpet all the possible adverse reactions/side effects.   Do not drive when taking Pain medications.  Do not take more than prescribed Pain, Sleep and Anxiety Medications  Wear Seat belts while driving.   Increase activity slowly   Complete by: As directed        Discharge Medications   Allergies as of 06/17/2023       Reactions   Zestril [lisinopril] Other (See Comments)   Hyperkalemia        Medication List     STOP taking these medications    nicotine 21 mg/24hr patch Commonly known as: NICODERM CQ - dosed in mg/24 hours   predniSONE 10 MG tablet Commonly known as: DELTASONE       TAKE these medications    albuterol 108 (90 Base) MCG/ACT inhaler Commonly known as: VENTOLIN HFA Inhale 2 puffs into the lungs every 6 (six) hours as needed for wheezing or shortness of breath.   amLODipine 10 MG tablet Commonly known as: NORVASC Take 1 tablet (10 mg total) by mouth daily.   atorvastatin 10 MG tablet Commonly known as: LIPITOR Take 1 tablet (10 mg total) by mouth daily.   busPIRone 10 MG tablet Commonly known as: BUSPAR Take 1 tablet (10 mg total) by mouth 2 (two) times daily.   ferrous sulfate 325 (65 FE) MG tablet Take 1 tablet (325 mg total) by  mouth daily with breakfast.   gabapentin 100 MG capsule Commonly known as: NEURONTIN Take 1 capsule (100 mg total) by mouth 3 (three) times daily.   hydrOXYzine 25 MG tablet Commonly known as: ATARAX Take 1 tablet (25 mg total) by mouth 3 (three) times daily as needed for anxiety or itching.   ipratropium-albuterol 0.5-2.5 (3) MG/3ML Soln Commonly known as: DUONEB Take 3 mLs by nebulization every 6 (six) hours as needed (shortness of breath and wheezing).   multivitamin Tabs tablet Take 1 tablet by mouth at bedtime.   pantoprazole 40 MG tablet Commonly known as: PROTONIX Take 1 tablet (40 mg total) by mouth 2 (two) times daily.   sodium bicarbonate 650 MG tablet Take 2 tablets (1,300 mg total) by mouth 2 (two) times daily.   torsemide 100 MG tablet Commonly known as: DEMADEX Take 1 tablet (100 mg total) by mouth daily.   traMADol 50 MG tablet Commonly known as: ULTRAM Take 1 tablet (50 mg total) by mouth every 8 (eight) hours as needed for up to 5 days for moderate pain (pain score 4-6) or severe pain (pain score 7-10) (This is your home medication for chronic pain and will be prescribed for chronic pain by your primary care physician.). What changed: reasons to take this   traZODone 50 MG tablet Commonly known as: DESYREL Take 1 tablet (50 mg total) by mouth at bedtime as needed for sleep.   Trelegy Ellipta 100-62.5-25 MCG/ACT Aepb Generic drug: Fluticasone-Umeclidin-Vilant Inhale 1 puff into the lungs daily.         Follow-up Information     [name], MD. Schedule an appointment as soon as possible  for a visit in 1 week(s).   Specialty: Internal Medicine Contact information: [address] [place] [address] [phone]         Estanislado Emms, MD. Schedule an appointment as soon as possible for a visit in 1 week(s).   Specialty: Nephrology Contact information: [address] [place] [address] [phone]          Shellia Cleverly, DO. Schedule an appointment as soon as possible for a visit in 1 week(s).   Specialty: Gastroenterology Contact information: [address] [place] [address] [phone]                 Major procedures and Radiology Reports - PLEASE review detailed and final reports thoroughly  -       DG CHEST PORT 1 VIEW Result Date: 06/12/2023 CLINICAL DATA:  Dyspnea.  Respiratory distress. EXAM: PORTABLE CHEST 1 VIEW COMPARISON:  06/12/2023 FINDINGS: Cardiac enlargement. Small bilateral pleural effusions, greater on the right. Increasing interstitial and airspace infiltrates in both lungs since prior study. This may represent progressing edema or pneumonia superimposed on chronic fibrosis. Mediastinal contours appear intact. IMPRESSION: Cardiac enlargement. Pleural effusions, greater on the right. Increasing infiltrates in the lungs. Electronically Signed   By: Burman Nieves M.D.   On: 06/12/2023 19:44   DG Chest 2 View Result Date: 06/12/2023 CLINICAL DATA:  Shortness of breath. EXAM: CHEST - 2 VIEW COMPARISON:  May 16, 2023. FINDINGS: Stable cardiomediastinal silhouette. Mild right pleural effusion which is increased compared to prior exam. There appears to be associated right basilar atelectasis. Minimal interstitial densities are noted in both lung bases concerning for edema. Bony thorax is unremarkable. IMPRESSION: Probable minimal bibasilar pulmonary edema. Mild right pleural effusion is noted which is slightly enlarged compared to prior exam with associated subsegmental atelectasis. Electronically Signed   By: Lupita Raider M.D.   On: 06/12/2023 12:32    Micro Results     No results found for this or any previous visit (from the past 240 hours).  Today   Subjective    [name] today has no headache,no chest abdominal pain,no new weakness tingling or numbness, feels much better wants to go home today.     Objective   Blood pressure (!) 155/65,  pulse 86, temperature 98.4 F (36.9 C), temperature source Oral, resp. rate 18, height 5\' 7"  (1.702 m), weight 54.9 kg, SpO2 98%.   Intake/Output Summary (Last 24 hours) at 06/17/2023 0959 Last data filed at 06/16/2023 1324 Gross per 24 hour  Intake --  Output 1.5 ml  Net -1.5 ml    Exam  Awake Alert, No new F.N deficits,    [place].AT,PERRAL Supple Neck,   Symmetrical Chest wall movement, Good air movement bilaterally, CTAB RRR,No Gallops,   +ve B.Sounds, Abd Soft, Non tender,  No Cyanosis, Clubbing or edema    Data Review   Recent Labs  Lab 06/11/23 1218 06/12/23 1108 06/12/23 2231 06/13/23 0526 06/13/23 1555 06/14/23 0517 06/14/23 1543 06/15/23 0525 06/16/23 0419  WBC 6.0 7.0   < > 6.1 7.3 6.2  --  5.2 5.3  HGB 6.7* 6.4*   < > 7.4* 7.4* 6.6* 9.2* 10.0* 8.9*  HCT 20.4* 20.4*   < > 23.6* 22.9* 20.8* 27.2* 29.9* 26.6*  PLT 294 281   < > 229 251 240  --  229 235  MCV 96 101.5*   < > 95.5 93.5 94.1  --  89.5 89.9  MCH 31.6  31.8   < > 30.0 30.2 29.9  --  29.9 30.1  MCHC 32.8 31.4   < > 31.4 32.3 31.7  --  33.4 33.5  RDW 16.9* 20.0*   < > 22.9* 22.8* 22.3*  --  19.6* 19.0*  LYMPHSABS 1.1 1.0  --   --   --  1.3  --  0.9 0.8  MONOABS  --  0.7  --   --   --  0.7  --  0.7 0.8  EOSABS 0.2 0.1  --   --   --  0.2  --  0.2 0.2  BASOSABS 0.0 0.0  --   --   --  0.0  --  0.0 0.0   < > = values in this interval not displayed.    Recent Labs  Lab 06/12/23 1104 06/13/23 0526 06/14/23 0517 06/15/23 0525 06/16/23 0419  NA 135 136 134* 132* 132*  K 5.0 5.7* 4.8 4.3 4.1  CL 108 107 105 103 101  CO2 15* 13* 14* 21* 20*  ANIONGAP 12 16* 15 8 11   GLUCOSE 96 88 95 103* 93  BUN 67* 70* 77* 53* 58*  CREATININE 5.52* 5.73* 6.10* 4.45* 5.03*  AST 23 16  --   --   --   ALT 19 15  --   --   --   ALKPHOS 93 79  --   --   --   BILITOT 0.7 0.5  --   --   --   ALBUMIN 2.9* 2.4*  --   --   --   PHOS  --   --  6.7*  --   --   CALCIUM 8.4* 8.2* 8.0* 8.0* 8.1*    Total Time in preparing  paper work, data evaluation and todays exam - 35 minutes  Signature  -    Susa Raring M.D on 06/17/2023 at 9:59 AM   -  To page go to [email or web address]

## 2023-06-17 NOTE — Progress Notes (Signed)
 Met with pt at bedside this morning per attending's request. Pt confirms that she does not plan to do out-pt HD at d/c. Pt states that she cannot commit to HD 3x's a week with pt's current social situation. Pt plans to return to hospital for assessment if/when possible HD may be needed. Pt states she may be agreeable to out-pt HD if/when she has stable housing and transportation can be arranged. Update provided to attending, nephrologist, CSW, and RN CM.   Olivia Canter Renal Navigator [phone]

## 2023-06-17 NOTE — Telephone Encounter (Signed)
 Copied from CRM [id number]. Topic: Clinical - Prescription Issue >> Jun 17, 2023 12:32 PM [name] wrote: Reason for CRM: pt was discharged from hospital and was told was given prescription but went to go pick it up and was told there were no prescriptions for her. Pt wants clarification on which prescription she should be taking and where she can go get her new prescriptons   PT callback [phone]

## 2023-06-17 NOTE — Plan of Care (Signed)
   Problem: Education: Goal: Knowledge of General Education information will improve Description: Including pain rating scale, medication(s)/side effects and non-pharmacologic comfort measures Outcome: Completed/Met   Problem: Health Behavior/Discharge Planning: Goal: Ability to manage health-related needs will improve Outcome: Completed/Met   Problem: Clinical Measurements: Goal: Ability to maintain clinical measurements within normal limits will improve Outcome: Completed/Met Goal: Will remain free from infection Outcome: Completed/Met Goal: Diagnostic test results will improve Outcome: Completed/Met Goal: Respiratory complications will improve Outcome: Completed/Met Goal: Cardiovascular complication will be avoided Outcome: Completed/Met   Problem: Activity: Goal: Risk for activity intolerance will decrease Outcome: Completed/Met   Problem: Nutrition: Goal: Adequate nutrition will be maintained Outcome: Completed/Met   Problem: Coping: Goal: Level of anxiety will decrease Outcome: Completed/Met   Problem: Elimination: Goal: Will not experience complications related to bowel motility Outcome: Completed/Met Goal: Will not experience complications related to urinary retention Outcome: Completed/Met   Problem: Pain Managment: Goal: General experience of comfort will improve and/or be controlled Outcome: Completed/Met   Problem: Safety: Goal: Ability to remain free from injury will improve Outcome: Completed/Met   Problem: Skin Integrity: Goal: Risk for impaired skin integrity will decrease Outcome: Completed/Met

## 2023-06-17 NOTE — Discharge Instructions (Signed)
 Follow with Primary MD [name], MD in 7 days   Get CBC, CMP, 2 view Chest X ray -  checked next visit with your primary MD    Activity: As tolerated with Full fall precautions use walker/cane & assistance as needed  Disposition Home   Diet: Renal diet with strict 1.2 L fluid restriction per day  Special Instructions: If you have smoked or chewed Tobacco  in the last 2 yrs please stop smoking, stop any regular Alcohol  and or any Recreational drug use.  On your next visit with your primary care physician please Get Medicines reviewed and adjusted.  Please request your Prim.MD to go over all Hospital Tests and Procedure/Radiological results at the follow up, please get all Hospital records sent to your Prim MD by signing hospital release before you go home.  If you experience worsening of your admission symptoms, develop shortness of breath, life threatening emergency, suicidal or homicidal thoughts you must seek medical attention immediately by calling 911 or calling your MD immediately  if symptoms less severe.  You Must read complete instructions/literature along with all the possible adverse reactions/side effects for all the Medicines you take and that have been prescribed to you. Take any new Medicines after you have completely understood and accpet all the possible adverse reactions/side effects.   Do not drive when taking Pain medications.  Do not take more than prescribed Pain, Sleep and Anxiety Medications  Wear Seat belts while driving.

## 2023-06-17 NOTE — Plan of Care (Signed)

## 2023-06-18 ENCOUNTER — Telehealth: Payer: Self-pay

## 2023-06-18 NOTE — Transitions of Care (Post Inpatient/ED Visit) (Signed)
   06/18/2023  Name: [name] MRN: [id number] DOB: 11-27-1960  Today's TOC FU Call Status:    Attempted to reach the patient regarding the most recent Inpatient/ED visit.  Follow Up Plan: Additional outreach attempts will be made to reach the patient to complete the Transitions of Care (Post Inpatient/ED visit) call.   Bary Leriche RN, MSN [facility], [facility] RN Care Manager Direct Dial: [phone]  Fax: [phone] Website: [email or web address]

## 2023-06-18 NOTE — Telephone Encounter (Signed)
 Her medications were addressed in the [facility] call documented today

## 2023-06-18 NOTE — Transitions of Care (Post Inpatient/ED Visit) (Signed)
 06/18/2023  Name: [name] MRN: [id number] DOB: 26-Jan-1961  Today's TOC FU Call Status: Today's TOC FU Call Status:: Successful TOC FU Call Completed TOC FU Call Complete Date: 06/18/23 Patient's Name and Date of Birth confirmed.  Transition Care Management Follow-up Telephone Call Date of Discharge: 06/17/23 Discharge Facility: [facility] Type of Discharge: Inpatient Admission Primary Inpatient Discharge Diagnosis:: anemia How have you been since you were released from the hospital?: Better (She said she is feeling all right.   She was " out and about" when I called) Any questions or concerns?: Yes Patient Questions/Concerns:: She stated that she missed paying her premium, $182.00, for her Medicare Advanage plan and was dropped from that plan.  She now only has Medicare A.  Per Cheryll Dessert, referral coordinator, she is not eligible for full Medicaid, only QMB.  I explained the Medicaid status to the patient and she did not think that was correct because she only paid $3.00 copays for her meds. She is planning to go to [facility] because she needs to sign up for Medicare Part B.  I explained to her that Medicare Part B does not cover meds, so she will need a drug plan or another Advantage plan for medication coverage. She is also concerned about housing.  She is currently staying with a friend and would like to go to [employer].  She asked that I contact the nurse at the [facility], Erick Blinks, and ask her to call her. Patient Questions/Concerns Addressed: Other: (patient plans to go to [facility].  Message sent to Erick Blinks, RN requesting she contact the patient about [facility])  Items Reviewed: Did you receive and understand the discharge instructions provided?: Yes Medications obtained,verified, and reconciled?: Partial Review Completed Reason for Partial Mediation Review: She was not at home when I called.   I started to review the list with her and she then said she needs to look at the  list again. She thinks she has everything but the trazodone. I told her to call this office with any questions after she reviews the list, and she said she understood.  She told me that she " took what was needed for today." She stated that she has a nebulizer. Any new allergies since your discharge?: No Dietary orders reviewed?: Yes Type of Diet Ordered:: Renal diet and 1.2L/day fluid restriction.  She said that they told her in the hospital that she can eat "whatever" and to "cancel" the fluid restriction.  the patient has chosen not to attend regularly scheduled dialysis sessions. Do you have support at home?: Yes Name of Support/Comfort Primary Source: staying with a friend  Medications Reviewed Today: Medications Reviewed Today   Medications were not reviewed in this encounter     Home Care and Equipment/Supplies: Were Home Health Services Ordered?: No Any new equipment or medical supplies ordered?: No (She stated she was told that she does not need O2.)  Functional Questionnaire: Do you need assistance with bathing/showering or dressing?: Yes Do you need assistance with meal preparation?: Yes Do you need assistance with eating?: Yes Do you have difficulty maintaining continence: Yes Do you need assistance with getting out of bed/getting out of a chair/moving?: Yes Do you have difficulty managing or taking your medications?: Yes  Follow up appointments reviewed: PCP Follow-up appointment confirmed?: Yes Date of PCP follow-up appointment?: 07/09/23 Follow-up Provider: Corene Cornea, PA Specialist Hospital Follow-up appointment confirmed?: Yes Date of Specialist follow-up appointment?: 07/15/23 Follow-Up Specialty Provider:: GI.  She needs to schedule an appointment with nephrology Do you need transportation to your follow-up appointment?: No (She said she can take the bus if she doesn't have a ride,) Do you understand care options if your condition(s) worsen?: Yes-patient  verbalized understanding    SIGNATURE. Robyne Peers, RN

## 2023-06-19 ENCOUNTER — Telehealth: Payer: Self-pay

## 2023-06-19 LAB — PTH, INTACT AND CALCIUM
Calcium, Total (PTH): 7.7 mg/dL — ABNORMAL LOW (ref 8.7–10.3)
PTH: 607 pg/mL — ABNORMAL HIGH (ref 15–65)

## 2023-06-19 NOTE — Transitions of Care (Post Inpatient/ED Visit) (Signed)
   06/19/2023  Name: ALEXEA BLASE MRN: [id number] DOB: June 30, 1960  Today's TOC FU Call Status: Today's TOC FU Call Status:: Unsuccessful Call (2nd Attempt) Unsuccessful Call (2nd Attempt) Date: 06/19/23  Attempted to reach the patient regarding the most recent Inpatient/ED visit.  Follow Up Plan: Additional outreach attempts will be made to reach the patient to complete the Transitions of Care (Post Inpatient/ED visit) call.   Bary Leriche RN, MSN [facility], [facility] RN Care Manager Direct Dial: [phone]  Fax: [phone] Website: [email or web address]

## 2023-06-20 ENCOUNTER — Telehealth: Payer: Self-pay

## 2023-06-20 NOTE — Transitions of Care (Post Inpatient/ED Visit) (Signed)
   06/20/2023  Name: [name] MRN: [id number] DOB: 1960/09/24  Today's TOC FU Call Status: Today's TOC FU Call Status:: Unsuccessful Call (3rd Attempt) Unsuccessful Call (3rd Attempt) Date: 06/20/23  Attempted to reach the patient regarding the most recent Inpatient/ED visit.  Follow Up Plan: No further outreach attempts will be made at this time. We have been unable to contact the patient.  Bary Leriche RN, MSN [facility], [facility] RN Care Manager Direct Dial: [phone]  Fax: [phone] Website: [email or web address]

## 2023-06-24 ENCOUNTER — Emergency Department (HOSPITAL_COMMUNITY)

## 2023-06-24 ENCOUNTER — Other Ambulatory Visit: Payer: Self-pay

## 2023-06-24 ENCOUNTER — Inpatient Hospital Stay (HOSPITAL_COMMUNITY)
Admission: EM | Admit: 2023-06-24 | Discharge: 2023-07-01 | DRG: 640 | Disposition: A | Discharge status: Home / Self Care | Attending: Internal Medicine | Admitting: Internal Medicine

## 2023-06-24 ENCOUNTER — Encounter (HOSPITAL_COMMUNITY): Payer: Self-pay | Admitting: Internal Medicine

## 2023-06-24 DIAGNOSIS — I132 Hypertensive heart and chronic kidney disease with heart failure and with stage 5 chronic kidney disease, or end stage renal disease: Secondary | ICD-10-CM | POA: Diagnosis present

## 2023-06-24 DIAGNOSIS — J449 Chronic obstructive pulmonary disease, unspecified: Secondary | ICD-10-CM

## 2023-06-24 DIAGNOSIS — D631 Anemia in chronic kidney disease: Secondary | ICD-10-CM | POA: Diagnosis present

## 2023-06-24 DIAGNOSIS — F1994 Other psychoactive substance use, unspecified with psychoactive substance-induced mood disorder: Secondary | ICD-10-CM

## 2023-06-24 DIAGNOSIS — D72829 Elevated white blood cell count, unspecified: Secondary | ICD-10-CM | POA: Diagnosis present

## 2023-06-24 DIAGNOSIS — Z5900 Homelessness unspecified: Secondary | ICD-10-CM

## 2023-06-24 DIAGNOSIS — J189 Pneumonia, unspecified organism: Secondary | ICD-10-CM | POA: Diagnosis present

## 2023-06-24 DIAGNOSIS — N186 End stage renal disease: Secondary | ICD-10-CM | POA: Diagnosis present

## 2023-06-24 DIAGNOSIS — Z8711 Personal history of peptic ulcer disease: Secondary | ICD-10-CM

## 2023-06-24 DIAGNOSIS — Z992 Dependence on renal dialysis: Secondary | ICD-10-CM | POA: Diagnosis not present

## 2023-06-24 DIAGNOSIS — E875 Hyperkalemia: Secondary | ICD-10-CM | POA: Diagnosis present

## 2023-06-24 DIAGNOSIS — Z833 Family history of diabetes mellitus: Secondary | ICD-10-CM

## 2023-06-24 DIAGNOSIS — Z888 Allergy status to other drugs, medicaments and biological substances status: Secondary | ICD-10-CM

## 2023-06-24 DIAGNOSIS — K219 Gastro-esophageal reflux disease without esophagitis: Secondary | ICD-10-CM | POA: Diagnosis present

## 2023-06-24 DIAGNOSIS — Z91158 Patient's noncompliance with renal dialysis for other reason: Secondary | ICD-10-CM

## 2023-06-24 DIAGNOSIS — Z79899 Other long term (current) drug therapy: Secondary | ICD-10-CM

## 2023-06-24 DIAGNOSIS — I5032 Chronic diastolic (congestive) heart failure: Secondary | ICD-10-CM | POA: Diagnosis present

## 2023-06-24 DIAGNOSIS — F17210 Nicotine dependence, cigarettes, uncomplicated: Secondary | ICD-10-CM | POA: Diagnosis present

## 2023-06-24 DIAGNOSIS — J441 Chronic obstructive pulmonary disease with (acute) exacerbation: Secondary | ICD-10-CM | POA: Diagnosis present

## 2023-06-24 DIAGNOSIS — E1122 Type 2 diabetes mellitus with diabetic chronic kidney disease: Secondary | ICD-10-CM | POA: Diagnosis present

## 2023-06-24 DIAGNOSIS — F32A Depression, unspecified: Secondary | ICD-10-CM | POA: Diagnosis present

## 2023-06-24 DIAGNOSIS — E871 Hypo-osmolality and hyponatremia: Secondary | ICD-10-CM | POA: Diagnosis present

## 2023-06-24 DIAGNOSIS — Z8616 Personal history of COVID-19: Secondary | ICD-10-CM

## 2023-06-24 DIAGNOSIS — Z720 Tobacco use: Secondary | ICD-10-CM | POA: Diagnosis present

## 2023-06-24 DIAGNOSIS — E8779 Other fluid overload: Secondary | ICD-10-CM | POA: Diagnosis present

## 2023-06-24 DIAGNOSIS — J9601 Acute respiratory failure with hypoxia: Secondary | ICD-10-CM | POA: Diagnosis present

## 2023-06-24 DIAGNOSIS — E785 Hyperlipidemia, unspecified: Secondary | ICD-10-CM | POA: Diagnosis present

## 2023-06-24 DIAGNOSIS — Z8719 Personal history of other diseases of the digestive system: Secondary | ICD-10-CM

## 2023-06-24 DIAGNOSIS — Z66 Do not resuscitate: Secondary | ICD-10-CM | POA: Diagnosis present

## 2023-06-24 DIAGNOSIS — M898X9 Other specified disorders of bone, unspecified site: Secondary | ICD-10-CM | POA: Diagnosis present

## 2023-06-24 DIAGNOSIS — F419 Anxiety disorder, unspecified: Secondary | ICD-10-CM | POA: Diagnosis present

## 2023-06-24 DIAGNOSIS — R0602 Shortness of breath: Secondary | ICD-10-CM | POA: Diagnosis present

## 2023-06-24 DIAGNOSIS — E8770 Fluid overload, unspecified: Secondary | ICD-10-CM | POA: Diagnosis present

## 2023-06-24 DIAGNOSIS — I10 Essential (primary) hypertension: Secondary | ICD-10-CM | POA: Diagnosis present

## 2023-06-24 DIAGNOSIS — K31819 Angiodysplasia of stomach and duodenum without bleeding: Secondary | ICD-10-CM

## 2023-06-24 DIAGNOSIS — G8929 Other chronic pain: Secondary | ICD-10-CM | POA: Diagnosis present

## 2023-06-24 DIAGNOSIS — D638 Anemia in other chronic diseases classified elsewhere: Secondary | ICD-10-CM | POA: Diagnosis present

## 2023-06-24 LAB — CBC WITH DIFFERENTIAL/PLATELET
Abs Immature Granulocytes: 0.13 10*3/uL — ABNORMAL HIGH (ref 0.00–0.07)
Basophils Absolute: 0.1 10*3/uL (ref 0.0–0.1)
Basophils Relative: 1 %
Eosinophils Absolute: 0.1 10*3/uL (ref 0.0–0.5)
Eosinophils Relative: 1 %
HCT: 29.9 % — ABNORMAL LOW (ref 36.0–46.0)
Hemoglobin: 9.3 g/dL — ABNORMAL LOW (ref 12.0–15.0)
Immature Granulocytes: 1 %
Lymphocytes Relative: 7 %
Lymphs Abs: 0.7 10*3/uL (ref 0.7–4.0)
MCH: 30 pg (ref 26.0–34.0)
MCHC: 31.1 g/dL (ref 30.0–36.0)
MCV: 96.5 fL (ref 80.0–100.0)
Monocytes Absolute: 0.9 10*3/uL (ref 0.1–1.0)
Monocytes Relative: 8 %
Neutro Abs: 8.9 10*3/uL — ABNORMAL HIGH (ref 1.7–7.7)
Neutrophils Relative %: 82 %
Platelets: 445 10*3/uL — ABNORMAL HIGH (ref 150–400)
RBC: 3.1 MIL/uL — ABNORMAL LOW (ref 3.87–5.11)
RDW: 19 % — ABNORMAL HIGH (ref 11.5–15.5)
WBC: 10.7 10*3/uL — ABNORMAL HIGH (ref 4.0–10.5)
nRBC: 0 % (ref 0.0–0.2)

## 2023-06-24 LAB — RESP PANEL BY RT-PCR (RSV, FLU A&B, COVID)  RVPGX2
Influenza A by PCR: NEGATIVE
Influenza B by PCR: NEGATIVE
Resp Syncytial Virus by PCR: NEGATIVE
SARS Coronavirus 2 by RT PCR: NEGATIVE

## 2023-06-24 LAB — COMPREHENSIVE METABOLIC PANEL WITH GFR
ALT: 14 U/L (ref 0–44)
AST: 23 U/L (ref 15–41)
Albumin: 2.9 g/dL — ABNORMAL LOW (ref 3.5–5.0)
Alkaline Phosphatase: 106 U/L (ref 38–126)
Anion gap: 17 — ABNORMAL HIGH (ref 5–15)
BUN: 73 mg/dL — ABNORMAL HIGH (ref 8–23)
CO2: 14 mmol/L — ABNORMAL LOW (ref 22–32)
Calcium: 8 mg/dL — ABNORMAL LOW (ref 8.9–10.3)
Chloride: 99 mmol/L (ref 98–111)
Creatinine, Ser: 5.65 mg/dL — ABNORMAL HIGH (ref 0.44–1.00)
GFR, Estimated: 8 mL/min — ABNORMAL LOW (ref >60)
Glucose, Bld: 83 mg/dL (ref 70–99)
Potassium: 5.7 mmol/L — ABNORMAL HIGH (ref 3.5–5.1)
Sodium: 130 mmol/L — ABNORMAL LOW (ref 135–145)
Total Bilirubin: 0.5 mg/dL (ref 0.0–1.2)
Total Protein: 7.2 g/dL (ref 6.5–8.1)

## 2023-06-24 LAB — I-STAT CHEM 8, ED
BUN: 76 mg/dL — ABNORMAL HIGH (ref 8–23)
Calcium, Ion: 1.01 mmol/L — ABNORMAL LOW (ref 1.15–1.40)
Chloride: 105 mmol/L (ref 98–111)
Creatinine, Ser: 6.4 mg/dL — ABNORMAL HIGH (ref 0.44–1.00)
Glucose, Bld: 80 mg/dL (ref 70–99)
HCT: 29 % — ABNORMAL LOW (ref 36.0–46.0)
Hemoglobin: 9.9 g/dL — ABNORMAL LOW (ref 12.0–15.0)
Potassium: 5.6 mmol/L — ABNORMAL HIGH (ref 3.5–5.1)
Sodium: 131 mmol/L — ABNORMAL LOW (ref 135–145)
TCO2: 17 mmol/L — ABNORMAL LOW (ref 22–32)

## 2023-06-24 LAB — PROCALCITONIN: Procalcitonin: 0.88 ng/mL

## 2023-06-24 LAB — BRAIN NATRIURETIC PEPTIDE: B Natriuretic Peptide: 1242.2 pg/mL — ABNORMAL HIGH (ref 0.0–100.0)

## 2023-06-24 LAB — TROPONIN I (HIGH SENSITIVITY)
Troponin I (High Sensitivity): 18 ng/L — ABNORMAL HIGH (ref <18)
Troponin I (High Sensitivity): 22 ng/L — ABNORMAL HIGH (ref <18)

## 2023-06-24 MED ORDER — SODIUM BICARBONATE 650 MG PO TABS
1300.0000 mg | ORAL_TABLET | Freq: Two times a day (BID) | ORAL | Status: DC
  Administered 2023-06-24 – 2023-07-01 (×12): 1300 mg via ORAL
  Filled 2023-06-24 (×12): qty 2

## 2023-06-24 MED ORDER — SODIUM CHLORIDE 0.9 % IV SOLN
500.0000 mg | Freq: Once | INTRAVENOUS | Status: AC
  Administered 2023-06-24: 500 mg via INTRAVENOUS
  Filled 2023-06-24: qty 5

## 2023-06-24 MED ORDER — PANTOPRAZOLE SODIUM 40 MG PO TBEC
40.0000 mg | DELAYED_RELEASE_TABLET | Freq: Two times a day (BID) | ORAL | Status: DC
  Administered 2023-06-24 – 2023-07-01 (×12): 40 mg via ORAL
  Filled 2023-06-24 (×12): qty 1

## 2023-06-24 MED ORDER — HEPARIN SODIUM (PORCINE) 5000 UNIT/ML IJ SOLN: 5000.0000 [IU] | Freq: Three times a day (TID) | INTRAMUSCULAR | Status: DC

## 2023-06-24 MED ORDER — ATORVASTATIN CALCIUM 10 MG PO TABS
10.0000 mg | ORAL_TABLET | Freq: Every day | ORAL | Status: DC
  Administered 2023-06-24 – 2023-07-01 (×8): 10 mg via ORAL
  Filled 2023-06-24 (×8): qty 1

## 2023-06-24 MED ORDER — TORSEMIDE 20 MG PO TABS
100.0000 mg | ORAL_TABLET | Freq: Every day | ORAL | Status: DC
Start: 2023-06-24 — End: 2023-07-01
  Administered 2023-06-24 – 2023-07-01 (×8): 100 mg via ORAL
  Filled 2023-06-24 (×8): qty 5

## 2023-06-24 MED ORDER — ACETAMINOPHEN 650 MG RE SUPP: 650.0000 mg | Freq: Four times a day (QID) | RECTAL | Status: DC | PRN

## 2023-06-24 MED ORDER — IPRATROPIUM-ALBUTEROL 0.5-2.5 (3) MG/3ML IN SOLN
3.0000 mL | Freq: Once | RESPIRATORY_TRACT | Status: AC
  Administered 2023-06-24: 3 mL via RESPIRATORY_TRACT
  Filled 2023-06-24: qty 3

## 2023-06-24 MED ORDER — BUDESON-GLYCOPYRROL-FORMOTEROL 160-9-4.8 MCG/ACT IN AERO
2.0000 | INHALATION_SPRAY | Freq: Two times a day (BID) | RESPIRATORY_TRACT | Status: DC
  Administered 2023-06-24 – 2023-07-01 (×13): 2 via RESPIRATORY_TRACT
  Filled 2023-06-24: qty 5.9

## 2023-06-24 MED ORDER — ALBUTEROL SULFATE (2.5 MG/3ML) 0.083% IN NEBU: 2.5000 mg | INHALATION_SOLUTION | RESPIRATORY_TRACT | Status: DC | PRN

## 2023-06-24 MED ORDER — ALBUTEROL SULFATE (2.5 MG/3ML) 0.083% IN NEBU
2.5000 mg | INHALATION_SOLUTION | RESPIRATORY_TRACT | Status: DC | PRN
  Administered 2023-06-25 – 2023-06-28 (×4): 2.5 mg via RESPIRATORY_TRACT
  Filled 2023-06-24 (×4): qty 3

## 2023-06-24 MED ORDER — ALBUTEROL SULFATE (2.5 MG/3ML) 0.083% IN NEBU
2.5000 mg | INHALATION_SOLUTION | Freq: Once | RESPIRATORY_TRACT | Status: AC
  Administered 2023-06-24: 2.5 mg via RESPIRATORY_TRACT
  Filled 2023-06-24: qty 3

## 2023-06-24 MED ORDER — ALBUTEROL SULFATE (2.5 MG/3ML) 0.083% IN NEBU
2.5000 mg | INHALATION_SOLUTION | Freq: Four times a day (QID) | RESPIRATORY_TRACT | Status: DC
  Administered 2023-06-24 – 2023-06-25 (×3): 2.5 mg via RESPIRATORY_TRACT
  Filled 2023-06-24 (×3): qty 3

## 2023-06-24 MED ORDER — SODIUM CHLORIDE 0.9 % IV SOLN
2.0000 g | Freq: Once | INTRAVENOUS | Status: AC
  Administered 2023-06-24: 2 g via INTRAVENOUS
  Filled 2023-06-24: qty 20

## 2023-06-24 MED ORDER — OXYCODONE HCL 5 MG PO TABS
5.0000 mg | ORAL_TABLET | ORAL | Status: AC | PRN
  Administered 2023-06-24 – 2023-06-25 (×3): 5 mg via ORAL
  Filled 2023-06-24 (×3): qty 1

## 2023-06-24 MED ORDER — AMLODIPINE BESYLATE 10 MG PO TABS
10.0000 mg | ORAL_TABLET | Freq: Every day | ORAL | Status: DC
  Administered 2023-06-24 – 2023-07-01 (×7): 10 mg via ORAL
  Filled 2023-06-24 (×9): qty 1

## 2023-06-24 MED ORDER — ACETAMINOPHEN 325 MG PO TABS
650.0000 mg | ORAL_TABLET | Freq: Four times a day (QID) | ORAL | Status: DC | PRN
  Administered 2023-06-24 – 2023-06-26 (×4): 650 mg via ORAL
  Filled 2023-06-24 (×4): qty 2

## 2023-06-24 MED ORDER — SODIUM CHLORIDE 0.9% FLUSH
3.0000 mL | Freq: Two times a day (BID) | INTRAVENOUS | Status: DC
  Administered 2023-06-24 – 2023-07-01 (×13): 3 mL via INTRAVENOUS

## 2023-06-24 MED ORDER — NICOTINE 14 MG/24HR TD PT24
14.0000 mg | MEDICATED_PATCH | Freq: Every day | TRANSDERMAL | Status: DC
  Administered 2023-06-24 – 2023-07-01 (×8): 14 mg via TRANSDERMAL
  Filled 2023-06-24 (×8): qty 1

## 2023-06-24 MED ORDER — ORAL CARE MOUTH RINSE: 15.0000 mL | OROMUCOSAL | Status: DC | PRN

## 2023-06-24 MED ORDER — FERROUS SULFATE 325 (65 FE) MG PO TABS
325.0000 mg | ORAL_TABLET | Freq: Every day | ORAL | Status: DC
  Administered 2023-06-25 – 2023-07-01 (×6): 325 mg via ORAL
  Filled 2023-06-24 (×6): qty 1

## 2023-06-24 MED ORDER — METHYLPREDNISOLONE SODIUM SUCC 125 MG IJ SOLR
125.0000 mg | Freq: Once | INTRAMUSCULAR | Status: AC
  Administered 2023-06-24: 125 mg via INTRAVENOUS
  Filled 2023-06-24: qty 2

## 2023-06-24 MED ORDER — PREDNISONE 20 MG PO TABS
40.0000 mg | ORAL_TABLET | Freq: Every day | ORAL | Status: DC
  Administered 2023-06-25 – 2023-06-26 (×2): 40 mg via ORAL
  Filled 2023-06-24 (×2): qty 2

## 2023-06-24 NOTE — Progress Notes (Signed)
 New Admission Note:   Arrival Method: Strecher Mental Orientation: Alert and oriented X4 Telemetry: Sinus Tach   IV: left forearm Pain: 7/10, back pain Tubes: none Safety Measures: Safety Fall Prevention Plan has been given, discussed and signed  [facility] [place] Orientation: Patient has been orientated to the room, unit and staff.  Family: none at bedside  Orders have been reviewed and implemented. Will continue to monitor the patient. Call light has been placed within reach and bed alarm has been activated.   Minna Merritts RN [facility]  7M Phone: [phone]

## 2023-06-24 NOTE — ED Notes (Signed)
 Patient placed on bedpan without difficulty

## 2023-06-24 NOTE — ED Notes (Signed)
 Patient refusing to be stuck for second set of blood cultures. MD notified.

## 2023-06-24 NOTE — H&P (Addendum)
 History and Physical    PatientJOLEY UTECHT [name]:[id number] DOB: 06-04-1960 DOA: 06/24/2023 DOS: the patient was seen and examined on 06/24/2023 PCP: [name], MD  Patient coming from: via EMS  Chief Complaint:  Chief Complaint  Patient presents with   Shortness of Breath   HPI: [name] is a 63 y.o. female with medical history significant of hypertension, hyperlipidemia, GERD, PUD, gastritis, GI bleed, hepatitis C, pleural effusion, substance use, anxiety, depression, COPD, anemia, chronic pain, ESRD with refusal of HD in the past who presents with complaints of shortness of breath.  Records note patient had multiple recent admissions including 2/20-2/25 with GI bleed 2/2 AVMs and progression to ESRD, readmitted 2/28-3/2 to the ICU due to oxygen requirements in the setting of volume overload for which she was treated with hemodialysis prior to leaving AMA, and lastly hospitalized 3/27-4/1 again secondary to fluid overload and concern for GI bleed.  Patient was dialyzed by nephrology and seen by GI but recommended to continue PPI as hemoglobin stable.  She reports feeling shortness of breath for the past day and a half accompanied by a cough but no fever. She is not on oxygen therapy at baseline.  She has end-stage renal disease and is recommended to undergo dialysis on a Monday, Wednesday, and Friday schedule. However, she has not adhered to this regimen, with the last session occurring a couple of weeks ago. She has a fistula in her right arm.  She smokes approximately half a pack of cigarettes per day and is interested in receiving a nicotine patch during her hospital stay. No nausea, vomiting, stomach pains, or changes in bowel movements, except for a bowel movement yesterday.  In the ED patient was noted to be afebrile with heart rates elevated up to 110, respirations 18-29, blood pressures elevated up to 165/75, and O2 saturations noted to be as low as 86% with  improvement currently on 3 L of nasal cannula oxygen.  Labs significant for sodium 130, potassium 5.7, CO2 14, BUN 73, creatinine 5.65, anion gap 17, BNP 1242.2,  and high-sensitivity troponin 18.  Review of Systems: As mentioned in the history of present illness. All other systems reviewed and are negative. Past Medical History:  Diagnosis Date   Acute on chronic blood loss anemia 08/01/2022   Acute on chronic diastolic CHF (congestive heart failure) (HCC) 08/12/2022   Acute on chronic respiratory failure with hypoxia (HCC) 05/16/2023   Acute pulmonary edema (HCC) 05/16/2023   Acute seasonal allergic rhinitis due to pollen 02/22/2016   Alcohol abuse    Allergy    Anxiety    Arteriovenous fistula infection, initial encounter (HCC) 04/16/2023   Arthritis    Asthma    Cannabis abuse    Cocaine abuse (HCC)    COPD (chronic obstructive pulmonary disease) (HCC)    COPD with acute exacerbation (HCC) 10/25/2022   COVID 05/16/2023   Depression    Drug addiction (HCC)    GERD (gastroesophageal reflux disease)    GIB (gastrointestinal bleeding) 08/19/2019   Heart murmur    Heme positive stool 08/14/2022   Homelessness    Hyperlipidemia    Hypertension    pt stated "every once in a while BP will be high but has not been prescribed medication for HTN.    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) (HCC) 02/22/2016   SIRS (systemic inflammatory response syndrome) (HCC) 10/25/2022   Past Surgical History:  Procedure Laterality Date   A/V FISTULAGRAM Right 01/31/2023   Procedure: A/V Fistulagram;  Surgeon: Cephus Shelling, MD;  Location: [facility];  Service: Cardiovascular;  Laterality: Right;   AV FISTULA PLACEMENT  Right 08/14/2022   Procedure: RIGHT ARM BRACHIOBASILIC ATERIOVENOUS FISTULA CREATION;  Surgeon: Leonie Douglas, MD;  Location: [facility] OR;  Service: Vascular;  Laterality: Right;   BASCILIC VEIN TRANSPOSITION Right 11/13/2022   Procedure: RIGHT ARM SECOND STAGE BASILIC VEIN TRANSPOSITION;  Surgeon: Leonie Douglas, MD;  Location: [facility] OR;  Service: Vascular;  Laterality: Right;   BIOPSY  01/01/2019   Procedure: BIOPSY;  Surgeon: Beverley Fiedler, MD;  Location: [facility] ENDOSCOPY;  Service: Endoscopy;;   BIOPSY  11/17/2021   Procedure: BIOPSY;  Surgeon: Lynann Bologna, MD;  Location: [facility] ENDOSCOPY;  Service: Gastroenterology;;   CESAREAN SECTION  1989   COLONOSCOPY  11/07/2020   2018   CYSTOSCOPY W/ URETERAL STENT PLACEMENT Left 08/01/2018   Procedure: CYSTOSCOPY WITH RETROGRADE PYELOGRAM/URETERAL STENT PLACEMENT;  Surgeon: Crista Elliot, MD;  Location: [facility] ORS;  Service: Urology;  Laterality: Left;   CYSTOSCOPY WITH RETROGRADE PYELOGRAM, URETEROSCOPY AND STENT PLACEMENT Left 01/15/2019   Procedure: CYSTOSCOPY WITH RETROGRADE PYELOGRAM, URETEROSCOPY AND STENT PLACEMENT;  Surgeon: Crista Elliot, MD;  Location: [facility] ORS;  Service: Urology;  Laterality: Left;   ENTEROSCOPY N/A 08/16/2022   Procedure: ENTEROSCOPY;  Surgeon: Meridee Score Netty Starring., MD;  Location: [facility] ENDOSCOPY;  Service: Gastroenterology;  Laterality: N/A;   ENTEROSCOPY N/A 10/31/2022   Procedure: ENTEROSCOPY;  Surgeon: Beverley Fiedler, MD;  Location: [facility] ENDOSCOPY;  Service: Gastroenterology;  Laterality: N/A;   ENTEROSCOPY N/A 05/09/2023   Procedure: ENTEROSCOPY;  Surgeon: [name], MD;  Location: [facility] ENDOSCOPY;  Service: Gastroenterology;  Laterality: N/A;   ESOPHAGOGASTRODUODENOSCOPY (EGD) WITH PROPOFOL N/A 01/01/2019   Procedure: ESOPHAGOGASTRODUODENOSCOPY (EGD) WITH PROPOFOL;  Surgeon: Beverley Fiedler, MD;  Location: [facility] ENDOSCOPY;  Service: Endoscopy;  Laterality: N/A;   ESOPHAGOGASTRODUODENOSCOPY (EGD) WITH PROPOFOL N/A 08/21/2019    Procedure: ESOPHAGOGASTRODUODENOSCOPY (EGD) WITH PROPOFOL;  Surgeon: Napoleon Form, MD;  Location: [facility] ENDOSCOPY;  Service: Endoscopy;  Laterality: N/A;   ESOPHAGOGASTRODUODENOSCOPY (EGD) WITH PROPOFOL N/A 11/17/2021   Procedure: ESOPHAGOGASTRODUODENOSCOPY (EGD) WITH PROPOFOL;  Surgeon: Lynann Bologna, MD;  Location: [facility] ENDOSCOPY;  Service: Gastroenterology;  Laterality: N/A;   FRACTURE SURGERY Left 2011   arm   HEMOSTASIS CLIP PLACEMENT  08/16/2022   Procedure: HEMOSTASIS CLIP PLACEMENT;  Surgeon: Lemar Lofty., MD;  Location: [facility] ENDOSCOPY;  Service: Gastroenterology;;   HOT HEMOSTASIS N/A 08/16/2022   Procedure: HOT HEMOSTASIS (ARGON PLASMA COAGULATION/BICAP);  Surgeon: Lemar Lofty., MD;  Location: [facility] ENDOSCOPY;  Service: Gastroenterology;  Laterality: N/A;   HOT HEMOSTASIS N/A 10/31/2022   Procedure: HOT HEMOSTASIS (ARGON PLASMA COAGULATION/BICAP);  Surgeon: Beverley Fiedler, MD;  Location: [facility] ENDOSCOPY;  Service: Gastroenterology;  Laterality: N/A;   HOT HEMOSTASIS N/A 05/09/2023   Procedure: HOT HEMOSTASIS (ARGON PLASMA COAGULATION/BICAP);  Surgeon: [name], MD;  Location: [facility] ENDOSCOPY;  Service: Gastroenterology;  Laterality: N/A;   INSERTION OF DIALYSIS CATHETER Right 08/14/2022   Procedure: INSERTION OF TUNNELED DIALYSIS CATHETER USING PALINDROME CATHETER KIT 19CM;  Surgeon: Leonie Douglas, MD;  Location: [facility] OR;  Service: Vascular;  Laterality: Right;  POLYPECTOMY  08/16/2022   Procedure: POLYPECTOMY;  Surgeon: Mansouraty, Netty Starring., MD;  Location: [facility] ENDOSCOPY;  Service: Gastroenterology;;   SUBMUCOSAL TATTOO INJECTION  08/16/2022   Procedure: SUBMUCOSAL TATTOO INJECTION;  Surgeon: Lemar Lofty., MD;  Location: [facility] ENDOSCOPY;  Service: Gastroenterology;;   UPPER GASTROINTESTINAL ENDOSCOPY     Social History:  reports that she has been smoking cigarettes. She has been exposed to tobacco smoke. She has never used smokeless tobacco. She reports that she  does not currently use alcohol. She reports current drug use. Drug: Marijuana.  Allergies  Allergen Reactions   Zestril [Lisinopril] Other (See Comments)    Hyperkalemia    Family History  Problem Relation Age of Onset   Diabetes Father    Colon cancer Neg Hx    Esophageal cancer Neg Hx    Rectal cancer Neg Hx    Stomach cancer Neg Hx     Prior to Admission medications   Medication Sig Start Date End Date Taking? Authorizing Provider  albuterol (VENTOLIN HFA) 108 (90 Base) MCG/ACT inhaler Inhale 2 puffs into the lungs every 6 (six) hours as needed for wheezing or shortness of breath. 06/11/23   Anders Simmonds, PA-C  amLODipine (NORVASC) 10 MG tablet Take 1 tablet (10 mg total) by mouth daily. 06/17/23   Leroy Sea, MD  atorvastatin (LIPITOR) 10 MG tablet Take 1 tablet (10 mg total) by mouth daily. 06/17/23   Leroy Sea, MD  busPIRone (BUSPAR) 10 MG tablet Take 1 tablet (10 mg total) by mouth 2 (two) times daily. 02/10/23   Anders Simmonds, PA-C  ferrous sulfate 325 (65 FE) MG tablet Take 1 tablet (325 mg total) by mouth daily with breakfast. 02/01/23   Ghimire, Werner Lean, MD  Fluticasone-Umeclidin-Vilant (TRELEGY ELLIPTA) 100-62.5-25 MCG/ACT AEPB Inhale 1 puff into the lungs daily. 06/11/23   Anders Simmonds, PA-C  gabapentin (NEURONTIN) 100 MG capsule Take 1 capsule (100 mg total) by mouth 3 (three) times daily. 02/01/23   Ghimire, Werner Lean, MD  hydrOXYzine (ATARAX) 25 MG tablet Take 1 tablet (25 mg total) by mouth 3 (three) times daily as needed for anxiety or itching. 06/11/23   Anders Simmonds, PA-C  ipratropium-albuterol (DUONEB) 0.5-2.5 (3) MG/3ML SOLN Take 3 mLs by nebulization every 6 (six) hours as needed (shortness of breath and wheezing). Patient not taking: Reported on 06/12/2023 05/01/23   [name], MD  multivitamin (RENA-VIT) TABS tablet Take 1 tablet by mouth at bedtime. Patient not taking: Reported on 06/12/2023 02/01/23   Maretta Bees, MD   pantoprazole (PROTONIX) 40 MG tablet Take 1 tablet (40 mg total) by mouth 2 (two) times daily. 06/17/23 09/15/23  Leroy Sea, MD  sodium bicarbonate 650 MG tablet Take 2 tablets (1,300 mg total) by mouth 2 (two) times daily. 06/17/23   Leroy Sea, MD  torsemide (DEMADEX) 100 MG tablet Take 1 tablet (100 mg total) by mouth daily. 06/17/23   Leroy Sea, MD  traZODone (DESYREL) 50 MG tablet Take 1 tablet (50 mg total) by mouth at bedtime as needed for sleep. 06/11/23   Anders Simmonds, PA-C    Physical Exam: Vitals:   06/24/23 1423 06/24/23 1504 06/24/23 1507 06/24/23 1515  BP:    (!) 160/73  Pulse:    98  Resp:    18  Temp: 99.2 F (37.3 C)     TempSrc: Oral     SpO2:  (!) 86% 95% 93%  Weight:  Height:        Constitutional: Chronically ill-appearing female who is able to follow commands. Eyes: PERRL, lids and conjunctivae normal ENMT: Mucous membranes are moist. Posterior pharynx clear of any exudate or lesions.  Neck: normal, supple, JVD present. Respiratory: Decreased overall aeration with expiratory wheezes and some intermittent rhonchi appreciated.  O2 saturations currently maintained on 3 L can. Cardiovascular: Regular rate and rhythm, no murmurs / rubs / gallops. No extremity edema. 2+ pedal pulses. No carotid bruits.  Right upper extremity fistula in place with palpable thrill. Abdomen: no tenderness, no masses palpated. Bowel sounds positive.  Musculoskeletal: no clubbing / cyanosis. No joint deformity upper and lower extremities. Good ROM, no contractures. Normal muscle tone.  Skin: no rashes, lesions, ulcers. No induration Neurologic: CN 2-12 grossly intact. Sensation intact, DTR normal. Strength 5/5 in all 4.  Psychiatric: Normal judgment and insight. Alert and oriented x 3. Normal mood.   Data Reviewed:  EKG sinus or ectopic atrial tachycardia at 102 bpm with frequent PVCs.  Reviewed labs, imaging, and pertinent records as documented.  Assessment  and Plan:  Acute respiratory failure with hypoxia secondary to fluid overload COPD exacerbation Hyperkalemia ESRD Acute.  Patient presents with complaints of progressively worsening shortness of breath and reports not undergoing outpatient dialysis.  O2 saturations noted to be as low as 86% with improvement on 3 L of nasal cannula oxygen currently.  On physical exam patient noted to have expiratory wheezes and rhonchi.  Chest x-ray showing cardiomegaly with mild central pulmonary vascular congestion, minimal left basilar pulmonary edema with probable associated pleural effusion, and small left pleural effusion with associated right basilar atelectasis or infiltrate.  Labs significant for potassium 5.7, BUN 73, creatinine 5.65, and BNP elevated at 1242.2.  Nephrology has been consulted for need of hemodialysis.  Patient has been given Solu-Medrol, breathing treatments, Rocephin, and azithromycin. -Admit to a medical telemetry bed -Continuous pulse oximetry with nasal cannula oxygen to maintain O2 saturations as -Strict I&Os -Continue breathing treatments -Prednisone -Appreciate nephrology consultative services for need of hemodialysis.  Leukocytosis Acute.  WBC elevated at 10.7.  Patient denies having any reports of fever.  Chest x-ray gave concern for the possibility of underlying pneumonia.  Patient had been initially treated with empiric antibiotics of Rocephin and azithromycin. -Check procalcitonin -Determine if medically appropriate to continue Rocephin and azithromycin  Anemia of chronic kidney disease Hemoglobin noted to be 9.3-> 9.9 on recheck.  Patient does not report having any episodes of blood in stools. -Recheck CBC tomorrow morning  Essential hypertension On admission blood pressures noted to be elevated up to 165/75. -Continue amlodipine and torsemide  Hyperlipidemia -Continue atorvastatin  Anxiety and depression Patient previously being on buspirone and  trazodone. -Consider resuming once med reconciliation completed if patient still taking  GERD History of PUD, gastritis, and AVM Noted during previous hospitalization to be fecal occult positive Patient had push enteroscopy on 2/21 with Schatzki's ring, small hiatal hernia, antral gastritis with linear erosions, and multiple AVMs scattered in the stomach, duodenal bulb, D3, and proximal jejunum, all treated with APC.  -Continue PPI  Tobacco abuse Patient reports smoking least a half a pack cigarettes per day on average. -Counseled on need of cessation of tobacco use -Nicotine patch offered   DVT prophylaxis: SCDs Advance Care Planning:   Code Status: Do not attempt resuscitation (DNR) PRE-ARREST INTERVENTIONS DESIRED    Consults: Nephrology  Family Communication: Patient's daughter updated over the phone.  Severity of Illness: The appropriate patient  status for this patient is INPATIENT. Inpatient status is judged to be reasonable and necessary in order to provide the required intensity of service to ensure the patient's safety. The patient's presenting symptoms, physical exam findings, and initial radiographic and laboratory data in the context of their chronic comorbidities is felt to place them at high risk for further clinical deterioration. Furthermore, it is not anticipated that the patient will be medically stable for discharge from the hospital within 2 midnights of admission.   * I certify that at the point of admission it is my clinical judgment that the patient will require inpatient hospital care spanning beyond 2 midnights from the point of admission due to high intensity of service, high risk for further deterioration and high frequency of surveillance required.*  Author: Clydie Braun, MD 06/24/2023 3:30 PM  For on call review [email or web address].

## 2023-06-24 NOTE — Plan of Care (Signed)
   Problem: Education: Goal: Knowledge of General Education information will improve Description: Including pain rating scale, medication(s)/side effects and non-pharmacologic comfort measures Outcome: Completed/Met

## 2023-06-24 NOTE — ED Provider Notes (Signed)
 [place] EMERGENCY DEPARTMENT AT [facility] Provider Note   CSN: [id number] Arrival date & time: 06/24/23  1359     History  Chief Complaint  Patient presents with   Shortness of Breath    [name] is a 64 y.o. female.  HPI 63 year old female who has COPD, ESRD, polysubstance abuse, homelessness, and other comorbidities presents with shortness of breath.  She reports shortness of breath originally started a few days ago and is getting worse.  She is having a nonproductive cough.  She has some chest pain when coughing but denies fevers.  EMS reports she has been given breathing treatments though no Solu-Medrol they did not have any stocked on their truck.  Otherwise, patient seems to be mildly improving according to the patient.  She denies any leg swelling.  Home Medications Prior to Admission medications   Medication Sig Start Date End Date Taking? Authorizing Provider  albuterol (VENTOLIN HFA) 108 (90 Base) MCG/ACT inhaler Inhale 2 puffs into the lungs every 6 (six) hours as needed for wheezing or shortness of breath. 06/11/23   Anders Simmonds, PA-C  amLODipine (NORVASC) 10 MG tablet Take 1 tablet (10 mg total) by mouth daily. 06/17/23   Leroy Sea, MD  atorvastatin (LIPITOR) 10 MG tablet Take 1 tablet (10 mg total) by mouth daily. 06/17/23   Leroy Sea, MD  busPIRone (BUSPAR) 10 MG tablet Take 1 tablet (10 mg total) by mouth 2 (two) times daily. 02/10/23   Anders Simmonds, PA-C  ferrous sulfate 325 (65 FE) MG tablet Take 1 tablet (325 mg total) by mouth daily with breakfast. 02/01/23   Ghimire, Werner Lean, MD  Fluticasone-Umeclidin-Vilant (TRELEGY ELLIPTA) 100-62.5-25 MCG/ACT AEPB Inhale 1 puff into the lungs daily. 06/11/23   Anders Simmonds, PA-C  gabapentin (NEURONTIN) 100 MG capsule Take 1 capsule (100 mg total) by mouth 3 (three) times daily. 02/01/23   Ghimire, Werner Lean, MD  hydrOXYzine (ATARAX) 25 MG tablet Take 1 tablet (25 mg total) by mouth  3 (three) times daily as needed for anxiety or itching. 06/11/23   Anders Simmonds, PA-C  ipratropium-albuterol (DUONEB) 0.5-2.5 (3) MG/3ML SOLN Take 3 mLs by nebulization every 6 (six) hours as needed (shortness of breath and wheezing). Patient not taking: Reported on 06/12/2023 05/01/23   [name], MD  multivitamin (RENA-VIT) TABS tablet Take 1 tablet by mouth at bedtime. Patient not taking: Reported on 06/12/2023 02/01/23   Maretta Bees, MD  pantoprazole (PROTONIX) 40 MG tablet Take 1 tablet (40 mg total) by mouth 2 (two) times daily. 06/17/23 09/15/23  Leroy Sea, MD  sodium bicarbonate 650 MG tablet Take 2 tablets (1,300 mg total) by mouth 2 (two) times daily. 06/17/23   Leroy Sea, MD  torsemide (DEMADEX) 100 MG tablet Take 1 tablet (100 mg total) by mouth daily. 06/17/23   Leroy Sea, MD  traZODone (DESYREL) 50 MG tablet Take 1 tablet (50 mg total) by mouth at bedtime as needed for sleep. 06/11/23   Anders Simmonds, PA-C      Allergies    Zestril [lisinopril]    Review of Systems   Review of Systems  Constitutional:  Negative for fever.  Respiratory:  Positive for cough and shortness of breath.   Cardiovascular:  Positive for chest pain. Negative for leg swelling.    Physical Exam Updated Vital Signs BP (!) 160/73   Pulse 98   Temp 99.2 F (37.3 C) (Oral)  Resp 18   Ht 5\' 7"  (1.702 m)   Wt 54.9 kg   SpO2 93%   BMI 18.96 kg/m  Physical Exam Vitals and nursing note reviewed.  Constitutional:      Appearance: She is well-developed.  HENT:     Head: Normocephalic and atraumatic.  Cardiovascular:     Rate and Rhythm: Regular rhythm. Tachycardia present.     Heart sounds: Normal heart sounds.  Pulmonary:     Effort: Pulmonary effort is normal. Tachypnea present.     Breath sounds: Wheezing and rales present.  Abdominal:     Palpations: Abdomen is soft.     Tenderness: There is no abdominal tenderness.  Musculoskeletal:     Right lower  leg: No edema.     Left lower leg: No edema.  Skin:    General: Skin is warm and dry.  Neurological:     Mental Status: She is alert.     ED Results / Procedures / Treatments   Labs (all labs ordered are listed, but only abnormal results are displayed) Labs Reviewed  COMPREHENSIVE METABOLIC PANEL WITH GFR - Abnormal; Notable for the following components:      Result Value   Sodium 130 (*)    Potassium 5.7 (*)    CO2 14 (*)    BUN 73 (*)    Creatinine, Ser 5.65 (*)    Calcium 8.0 (*)    Albumin 2.9 (*)    GFR, Estimated 8 (*)    Anion gap 17 (*)    All other components within normal limits  CBC WITH DIFFERENTIAL/PLATELET - Abnormal; Notable for the following components:   WBC 10.7 (*)    RBC 3.10 (*)    Hemoglobin 9.3 (*)    HCT 29.9 (*)    RDW 19.0 (*)    Platelets 445 (*)    Neutro Abs 8.9 (*)    Abs Immature Granulocytes 0.13 (*)    All other components within normal limits  I-STAT CHEM 8, ED - Abnormal; Notable for the following components:   Sodium 131 (*)    Potassium 5.6 (*)    BUN 76 (*)    Creatinine, Ser 6.40 (*)    Calcium, Ion 1.01 (*)    TCO2 17 (*)    Hemoglobin 9.9 (*)    HCT 29.0 (*)    All other components within normal limits  TROPONIN I (HIGH SENSITIVITY) - Abnormal; Notable for the following components:   Troponin I (High Sensitivity) 18 (*)    All other components within normal limits  RESP PANEL BY RT-PCR (RSV, FLU A&B, COVID)  RVPGX2  CULTURE, BLOOD (ROUTINE X 2)  CULTURE, BLOOD (ROUTINE X 2)  BRAIN NATRIURETIC PEPTIDE  I-STAT CG4 LACTIC ACID, ED    EKG EKG Interpretation Date/Time:  Tuesday June 24 2023 14:13:12 EDT Ventricular Rate:  102 PR Interval:  189 QRS Duration:  83 QT Interval:  336 QTC Calculation: 434 R Axis:   84  Text Interpretation: Sinus or ectopic atrial tachycardia Paired ventricular premature complexes Aberrant conduction of SV complex(es) Borderline right axis deviation Confirmed by Pricilla Loveless [phone]) on  06/24/2023 2:15:25 PM  Radiology No results found.  Procedures .Critical Care  Performed by: Pricilla Loveless, MD Authorized by: Pricilla Loveless, MD   Critical care provider statement:    Critical care time (minutes):  30   Critical care time was exclusive of:  Separately billable procedures and treating other patients   Critical care was necessary to  treat or prevent imminent or life-threatening deterioration of the following conditions:  Respiratory failure   Critical care was time spent personally by me on the following activities:  Development of treatment plan with patient or surrogate, discussions with consultants, evaluation of patient's response to treatment, examination of patient, ordering and review of laboratory studies, ordering and review of radiographic studies, ordering and performing treatments and interventions, pulse oximetry, re-evaluation of patient's condition and review of old charts     Medications Ordered in ED Medications  cefTRIAXone (ROCEPHIN) 2 g in sodium chloride 0.9 % 100 mL IVPB (has no administration in time range)  azithromycin (ZITHROMAX) 500 mg in sodium chloride 0.9 % 250 mL IVPB (has no administration in time range)  methylPREDNISolone sodium succinate (SOLU-MEDROL) 125 mg/2 mL injection 125 mg (125 mg Intravenous Given 06/24/23 1420)  ipratropium-albuterol (DUONEB) 0.5-2.5 (3) MG/3ML nebulizer solution 3 mL (3 mLs Nebulization Given 06/24/23 1420)  albuterol (PROVENTIL) (2.5 MG/3ML) 0.083% nebulizer solution 2.5 mg (2.5 mg Nebulization Given 06/24/23 1420)    ED Course/ Medical Decision Making/ A&P                                 Medical Decision Making Amount and/or Complexity of Data Reviewed Labs: ordered.    Details: Mild hyperkalemia. Radiology: ordered and independent interpretation performed.    Details: Pleural effusions. ECG/medicine tests: ordered and independent interpretation performed.    Details: Sinus  tachycardia.  Risk Prescription drug management. Decision regarding hospitalization.   Patient presents with increased work of breathing and wheezing.  Given DuoNebs and Solu-Medrol.  She is feeling somewhat better but became hypoxic and is now on supplemental oxygen.  She has some increased work of breathing but not to the point that I think she needs BiPAP at this time.  She will need dialysis and I discussed with nephrology, Dr. Marisue Humble.  Otherwise I have also discussed with Dr. Katrinka Blazing for a hospitalist admission.  She is willing to do dialysis at this time, though chart review shows that she was refusing it in the hospital last admission.        Final Clinical Impression(s) / ED Diagnoses Final diagnoses:  Acute respiratory failure with hypoxia [facility])    Rx / DC Orders ED Discharge Orders     None         Pricilla Loveless, MD 06/24/23 1534

## 2023-06-24 NOTE — ED Triage Notes (Signed)
 Patient brought in by [facility] EMS for shortness of breath and possible COPD exacerbation. Patient is noncompliant with dialysis for mental health reason, dialyzed here when needed- one month since last tx. Patient has nonproductive cough and neck pain x a few days. Wheezing and rhonchi throughout. 1 duoneb without relief.   20 LAC.   VSS.  O2 between 89-95 on neb.

## 2023-06-24 NOTE — ED Notes (Signed)
 XR at bedside

## 2023-06-25 LAB — RENAL FUNCTION PANEL
Albumin: 2.4 g/dL — ABNORMAL LOW (ref 3.5–5.0)
Anion gap: 16 — ABNORMAL HIGH (ref 5–15)
BUN: 79 mg/dL — ABNORMAL HIGH (ref 8–23)
CO2: 16 mmol/L — ABNORMAL LOW (ref 22–32)
Calcium: 7.7 mg/dL — ABNORMAL LOW (ref 8.9–10.3)
Chloride: 102 mmol/L (ref 98–111)
Creatinine, Ser: 5.63 mg/dL — ABNORMAL HIGH (ref 0.44–1.00)
GFR, Estimated: 8 mL/min — ABNORMAL LOW (ref >60)
Glucose, Bld: 130 mg/dL — ABNORMAL HIGH (ref 70–99)
Phosphorus: 7.4 mg/dL — ABNORMAL HIGH (ref 2.5–4.6)
Potassium: 4.3 mmol/L (ref 3.5–5.1)
Sodium: 134 mmol/L — ABNORMAL LOW (ref 135–145)

## 2023-06-25 LAB — HEPATITIS B SURFACE ANTIGEN: Hepatitis B Surface Ag: NONREACTIVE

## 2023-06-25 LAB — CBC
HCT: 25.8 % — ABNORMAL LOW (ref 36.0–46.0)
Hemoglobin: 8.7 g/dL — ABNORMAL LOW (ref 12.0–15.0)
MCH: 30.9 pg (ref 26.0–34.0)
MCHC: 33.7 g/dL (ref 30.0–36.0)
MCV: 91.5 fL (ref 80.0–100.0)
Platelets: 398 10*3/uL (ref 150–400)
RBC: 2.82 MIL/uL — ABNORMAL LOW (ref 3.87–5.11)
RDW: 18.6 % — ABNORMAL HIGH (ref 11.5–15.5)
WBC: 7.1 10*3/uL (ref 4.0–10.5)
nRBC: 0 % (ref 0.0–0.2)

## 2023-06-25 MED ORDER — GABAPENTIN 100 MG PO CAPS
100.0000 mg | ORAL_CAPSULE | Freq: Three times a day (TID) | ORAL | Status: DC
  Administered 2023-06-25 – 2023-07-01 (×15): 100 mg via ORAL
  Filled 2023-06-25 (×15): qty 1

## 2023-06-25 MED ORDER — HYDROXYZINE HCL 25 MG PO TABS: 25.0000 mg | ORAL_TABLET | Freq: Three times a day (TID) | ORAL | Status: DC | PRN

## 2023-06-25 MED ORDER — BUSPIRONE HCL 5 MG PO TABS
10.0000 mg | ORAL_TABLET | Freq: Two times a day (BID) | ORAL | Status: DC
  Administered 2023-06-25 – 2023-06-29 (×8): 10 mg via ORAL
  Filled 2023-06-25 (×8): qty 2

## 2023-06-25 MED ORDER — HYDROCOD POLI-CHLORPHE POLI ER 10-8 MG/5ML PO SUER
5.0000 mL | Freq: Two times a day (BID) | ORAL | Status: DC | PRN
  Administered 2023-06-25 – 2023-06-28 (×4): 5 mL via ORAL
  Filled 2023-06-25 (×7): qty 5

## 2023-06-25 MED ORDER — ALBUTEROL SULFATE (2.5 MG/3ML) 0.083% IN NEBU
2.5000 mg | INHALATION_SOLUTION | Freq: Two times a day (BID) | RESPIRATORY_TRACT | Status: DC
  Administered 2023-06-25 – 2023-06-27 (×5): 2.5 mg via RESPIRATORY_TRACT
  Filled 2023-06-25 (×5): qty 3

## 2023-06-25 MED ORDER — HEPARIN SODIUM (PORCINE) 1000 UNIT/ML IJ SOLN
2000.0000 [IU] | INTRAMUSCULAR | Status: DC | PRN
  Administered 2023-06-25: 2000 [IU] via INTRAVENOUS
  Filled 2023-06-25: qty 2

## 2023-06-25 MED ORDER — TRAZODONE HCL 50 MG PO TABS
50.0000 mg | ORAL_TABLET | Freq: Every evening | ORAL | Status: DC | PRN
  Administered 2023-06-28 – 2023-06-30 (×3): 50 mg via ORAL
  Filled 2023-06-25 (×3): qty 1

## 2023-06-25 MED ORDER — CHLORHEXIDINE GLUCONATE CLOTH 2 % EX PADS
6.0000 | MEDICATED_PAD | Freq: Every day | CUTANEOUS | Status: DC
  Administered 2023-06-25 – 2023-06-29 (×5): 6 via TOPICAL

## 2023-06-25 MED ORDER — DARBEPOETIN ALFA 60 MCG/0.3ML IJ SOSY
60.0000 ug | PREFILLED_SYRINGE | INTRAMUSCULAR | Status: DC
  Administered 2023-06-25: 60 ug via SUBCUTANEOUS

## 2023-06-25 NOTE — Consult Note (Signed)
 Reason for Consult: To manage dialysis and dialysis related needs Referring Physician: Dr Johny Chess Seelinger is an 63 y.o. female.  HPI: Pt is a 64F with ESRD not on dialysis who is seen for management of volume overload.    She has a host of social issues that have been explored which has ultimately resulted in her not attending dialysis regularly or having a dialysis center.  Discharged from [facility] 06/17/23 for SOB and volume overload, had 1 session of dialysis it appears.    She is back requesting the same.  On O2.  Still no permanent address.     Past Medical History:  Diagnosis Date   Acute on chronic blood loss anemia 08/01/2022   Acute on chronic diastolic CHF (congestive heart failure) (HCC) 08/12/2022   Acute on chronic respiratory failure with hypoxia (HCC) 05/16/2023   Acute pulmonary edema (HCC) 05/16/2023   Acute seasonal allergic rhinitis due to pollen 02/22/2016   Alcohol abuse    Allergy    Anxiety    Arteriovenous fistula infection, initial encounter (HCC) 04/16/2023   Arthritis    Asthma    Cannabis abuse    Cocaine abuse (HCC)    COPD (chronic obstructive pulmonary disease) (HCC)    COPD with acute exacerbation (HCC) 10/25/2022   COVID 05/16/2023   Depression    Drug addiction (HCC)    GERD (gastroesophageal reflux disease)    GIB (gastrointestinal bleeding) 08/19/2019   Heart murmur    Heme positive stool 08/14/2022   Homelessness    Hyperlipidemia    Hypertension    pt stated "every once in a while BP will be high but has not been prescribed medication for HTN.    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) 02/22/2016   SIRS (systemic inflammatory response syndrome) (HCC) 10/25/2022     Past Surgical History:  Procedure Laterality Date   A/V FISTULAGRAM Right 01/31/2023   Procedure: A/V Fistulagram;  Surgeon: Cephus Shelling, MD;  [facility]: [facility] INVASIVE CV LAB;  Service: Cardiovascular;  Laterality: Right;   AV FISTULA PLACEMENT Right 08/14/2022   Procedure: RIGHT ARM BRACHIOBASILIC ATERIOVENOUS FISTULA CREATION;  Surgeon: Leonie Douglas, MD;  Location: [facility] OR;  Service: Vascular;  Laterality: Right;   BASCILIC VEIN TRANSPOSITION Right 11/13/2022   Procedure: RIGHT ARM SECOND STAGE BASILIC VEIN TRANSPOSITION;  Surgeon: Leonie Douglas, MD;  Location: [facility] OR;  Service: Vascular;  Laterality: Right;   BIOPSY  01/01/2019   Procedure: BIOPSY;  Surgeon: Beverley Fiedler, MD;  Location: [facility] ENDOSCOPY;  Service: Endoscopy;;   BIOPSY  11/17/2021   Procedure: BIOPSY;  Surgeon: Lynann Bologna, MD;  Location: [facility] ENDOSCOPY;  Service: Gastroenterology;;   CESAREAN SECTION  1989   COLONOSCOPY  11/07/2020   2018   CYSTOSCOPY W/ URETERAL STENT PLACEMENT Left 08/01/2018   Procedure: CYSTOSCOPY WITH RETROGRADE PYELOGRAM/URETERAL STENT PLACEMENT;  Surgeon: Crista Elliot, MD;  Location: [facility] ORS;  Service: Urology;  Laterality: Left;   CYSTOSCOPY WITH RETROGRADE PYELOGRAM, URETEROSCOPY AND STENT PLACEMENT Left 01/15/2019   Procedure: CYSTOSCOPY WITH RETROGRADE PYELOGRAM, URETEROSCOPY AND STENT PLACEMENT;  Surgeon: Crista Elliot, MD;  Location: [facility] ORS;  Service: Urology;  Laterality: Left;  ENTEROSCOPY N/A 08/16/2022   Procedure: ENTEROSCOPY;  Surgeon: Mansouraty, Netty Starring., MD;  Location[facility] ENDOSCOPY;  Service: Gastroenterology;  Laterality: N/A;   ENTEROSCOPY N/A 10/31/2022   Procedure: ENTEROSCOPY;  Surgeon: Beverley Fiedler, MD;  Location: [facility] ENDOSCOPY;  Service: Gastroenterology;  Laterality: N/A;   ENTEROSCOPY N/A 05/09/2023   Procedure: ENTEROSCOPY;  Surgeon: [name], MD;  Location: [facility] ENDOSCOPY;  Service: Gastroenterology;  Laterality: N/A;   ESOPHAGOGASTRODUODENOSCOPY  (EGD) WITH PROPOFOL N/A 01/01/2019   Procedure: ESOPHAGOGASTRODUODENOSCOPY (EGD) WITH PROPOFOL;  Surgeon: Beverley Fiedler, MD;  [facility]: [facility] ENDOS[facility];  Service: Endoscopy;  Laterality: N/A;   ESOPHAGOGASTRODUODENOSCOPY (EGD) WITH PROPOFOL N/A 08/21/2019   Procedure: ESOPHAGOGASTRODUODENOSCOPY (EGD) WITH PROPOFOL;  Surgeon: Napoleon Form, MD;  Location: [facility] ENDOSCOPY;  Service: Endoscopy;  Laterality: N/A;   ESOPHAGOGASTRODUODENOSCOPY (EGD) WITH PROPOFOL N/A 11/17/2021   Procedure: ESOPHAGOGASTRODUODENOSCOPY (EGD) WITH PROPOFOL;  Surgeon: Lynann Bologna, MD;  Location: [facility] ENDOSCOPY;  Service: Gastroenterology;  Laterality: N/A;   FRACTURE SURGERY Left 2011   arm   HEMOSTASIS CLIP PLACEMENT  08/16/2022   Procedure: HEMOSTASIS CLIP PLACEMENT;  Surgeon: Lemar Lofty., MD;  Location: [facility] ENDOSCOPY;  Service: Gastroenterology;;   HOT HEMOSTASIS N/A 08/16/2022   Procedure: HOT HEMOSTASIS (ARGON PLASMA COAGULATION/BICAP);  Surgeon: Lemar Lofty., MD;  Location: [facility] ENDOSCOPY;  Service: Gastroenterology;  Laterality: N/A;   HOT HEMOSTASIS N/A 10/31/2022   Procedure: HOT HEMOSTASIS (ARGON PLASMA COAGULATION/BICAP);  Surgeon: Beverley Fiedler, MD;  Location: [facility] ENDOSCOPY;  Service: Gastroenterology;  Laterality: N/A;   HOT HEMOSTASIS N/A 05/09/2023   Procedure: HOT HEMOSTASIS (ARGON PLASMA COAGULATION/BICAP);  Surgeon: [name], MD;  Location: [facility] ENDOSCOPY;  Service: Gastroenterology;  Laterality: N/A;   INSERTION OF DIALYSIS CATHETER Right 08/14/2022   Procedure: INSERTION OF TUNNELED DIALYSIS CATHETER USING PALINDROME CATHETER KIT 19CM;  Surgeon: Leonie Douglas, MD;  Location: [facility] OR;  Service: Vascular;  Laterality: Right;   POLYPECTOMY  08/16/2022   Procedure: POLYPECTOMY;  Surgeon: Meridee Score Netty Starring., MD;  Location: [facility] ENDOSCOPY;  Service: Gastroenterology;;   SUBMUCOSAL TATTOO INJECTION  08/16/2022   Procedure: SUBMUCOSAL TATTOO INJECTION;  Surgeon: Lemar Lofty., MD;   Location: [facility] ENDOSCOPY;  Service: Gastroenterology;;   UPPER GASTROINTESTINAL ENDOSCOPY      Family History  Problem Relation Age of Onset   Diabetes Father    Colon cancer Neg Hx    Esophageal cancer Neg Hx    Rectal cancer Neg Hx    Stomach cancer Neg Hx     Social History:  reports that she has been smoking cigarettes. She has been exposed to tobacco smoke. She has never used smokeless tobacco. She reports that she does not currently use alcohol. She reports current drug use. Drug: Marijuana.  Allergies:  Allergies  Allergen Reactions   Zestril [Lisinopril] Other (See Comments)    Hyperkalemia    Medications: Scheduled:  albuterol  2.5 mg Nebulization BID   amLODipine  10 mg Oral Daily   atorvastatin  10 mg Oral Daily   budeson-glycopyrrolate-formoterol  2 puff Inhalation BID   Chlorhexidine Gluconate Cloth  6 each Topical Q0600   ferrous sulfate  325 mg Oral Q breakfast   nicotine  14 mg Transdermal Daily   pantoprazole  40 mg Oral BID   predniSONE  40 mg Oral Q breakfast   sodium bicarbonate  1,300 mg Oral BID   sodium chloride flush  3 mL Intravenous Q12H   torsemide  100 mg Oral Daily  Results for orders placed or performed during the hospital encounter of 06/24/23 (from the past 48 hours)  Comprehensive metabolic panel     Status: Abnormal   Collection Time: 06/24/23  2:10 PM  Result Value Ref Range   Sodium 130 (L) 135 - 145 mmol/L   Potassium 5.7 (H) 3.5 - 5.1 mmol/L   Chloride 99 98 - 111 mmol/L   CO2 14 (L) 22 - 32 mmol/L   Glucose, Bld 83 70 - 99 mg/dL    Comment: Glucose reference range applies only to samples taken after fasting for at least 8 hours.   BUN 73 (H) 8 - 23 mg/dL   Creatinine, Ser 0.10 (H) 0.44 - 1.00 mg/dL   Calcium 8.0 (L) 8.9 - 10.3 mg/dL   Total Protein 7.2 6.5 - 8.1 g/dL   Albumin 2.9 (L) 3.5 - 5.0 g/dL   AST 23 15 - 41 U/L   ALT 14 0 - 44 U/L   Alkaline Phosphatase 106 38 - 126 U/L   Total Bilirubin 0.5 0.0 - 1.2 mg/dL    GFR, Estimated 8 (L) >60 mL/min    Comment: (NOTE) Calculated using the CKD-EPI Creatinine Equation (2021)    Anion gap 17 (H) 5 - 15    Comment: Performed at [facility], [address], [place], [place] [address]  Troponin I (High Sensitivity)     Status: Abnormal   Collection Time: 06/24/23  2:10 PM  Result Value Ref Range   Troponin I (High Sensitivity) 18 (H) <18 ng/L    Comment: (NOTE) Elevated high sensitivity troponin I (hsTnI) values and significant  changes across serial measurements may suggest ACS but many other  chronic and acute conditions are known to elevate hsTnI results.  Refer to the "Links" section for chest pain algorithms and additional  guidance. Performed at [facility], [address], [place], [place] [address]   Brain natriuretic peptide     Status: Abnormal   Collection Time: 06/24/23  2:10 PM  Result Value Ref Range   B Natriuretic Peptide 1,242.2 (H) 0.0 - 100.0 pg/mL    Comment: Performed at [facility], [address], [place], [place] [address]  CBC with Differential     Status: Abnormal   Collection Time: 06/24/23  2:10 PM  Result Value Ref Range   WBC 10.7 (H) 4.0 - 10.5 K/uL   RBC 3.10 (L) 3.87 - 5.11 MIL/uL   Hemoglobin 9.3 (L) 12.0 - 15.0 g/dL   HCT 59.5 (L) 63.8 - 75.6 %   MCV 96.5 80.0 - 100.0 fL   MCH 30.0 26.0 - 34.0 pg   MCHC 31.1 30.0 - 36.0 g/dL   RDW 43.3 (H) 29.5 - 18.8 %   Platelets 445 (H) 150 - 400 K/uL   nRBC 0.0 0.0 - 0.2 %   Neutrophils Relative % 82 %   Neutro Abs 8.9 (H) 1.7 - 7.7 K/uL   Lymphocytes Relative 7 %   Lymphs Abs 0.7 0.7 - 4.0 K/uL   Monocytes Relative 8 %   Monocytes Absolute 0.9 0.1 - 1.0 K/uL   Eosinophils Relative 1 %   Eosinophils Absolute 0.1 0.0 - 0.5 K/uL   Basophils Relative 1 %   Basophils Absolute 0.1 0.0 - 0.1 K/uL   Immature Granulocytes 1 %   Abs Immature Granulocytes 0.13 (H) 0.00 - 0.07 K/uL    Comment: Performed at [facility], [address]  [place], [place]  [address]  Resp panel by RT-PCR (RSV, Flu A&B, Covid) Anterior Nasal Swab     Status: None   Collection Time: 06/24/23  2:14 PM   Specimen: Anterior Nasal Swab  Result Value Ref Range   SARS Coronavirus 2 by RT PCR NEGATIVE NEGATIVE   Influenza A by PCR NEGATIVE NEGATIVE   Influenza B by PCR NEGATIVE NEGATIVE    Comment: (NOTE) The Xpert Xpress SARS-CoV-2/FLU/RSV plus assay is intended as an aid in the diagnosis of influenza from Nasopharyngeal swab specimens and should not be used as a sole basis for treatment. Nasal washings and aspirates are unacceptable for Xpert Xpress SARS-CoV-2/FLU/RSV testing.  Fact Sheet for Patients: BloggerCourse.com  Fact Sheet for Healthcare Providers: SeriousBroker.it  This test is not yet approved or cleared by the [place] FDA and has been authorized for detection and/or diagnosis of SARS-CoV-2 by FDA under an Emergency Use Authorization (EUA). This EUA will remain in effect (meaning this test can be used) for the duration of the COVID-19 declaration under Section 564(b)(1) of the Act, 21 U.S.C. section 360bbb-3(b)(1), unless the authorization is terminated or revoked.     Resp Syncytial Virus by PCR NEGATIVE NEGATIVE    Comment: (NOTE) Fact Sheet for Patients: BloggerCourse.com  Fact Sheet for Healthcare Providers: SeriousBroker.it  This test is not yet approved or cleared by the [place] FDA and has been authorized for detection and/or diagnosis of SARS-CoV-2 by FDA under an Emergency Use Authorization (EUA). This EUA will remain in effect (meaning this test can be used) for the duration of the COVID-19 declaration under Section 564(b)(1) of the Act, 21 U.S.C. section 360bbb-3(b)(1), unless the authorization is terminated or revoked.  Performed at [facility], [address] [place], [place] [address]    I-stat chem 8, ED     Status: Abnormal   Collection Time: 06/24/23  2:29 PM  Result Value Ref Range   Sodium 131 (L) 135 - 145 mmol/L   Potassium 5.6 (H) 3.5 - 5.1 mmol/L   Chloride 105 98 - 111 mmol/L   BUN 76 (H) 8 - 23 mg/dL   Creatinine, Ser 9.14 (H) 0.44 - 1.00 mg/dL   Glucose, Bld 80 70 - 99 mg/dL    Comment: Glucose reference range applies only to samples taken after fasting for at least 8 hours.   Calcium, Ion 1.01 (L) 1.15 - 1.40 mmol/L   TCO2 17 (L) 22 - 32 mmol/L   Hemoglobin 9.9 (L) 12.0 - 15.0 g/dL   HCT 78.2 (L) 95.6 - 21.3 %  Troponin I (High Sensitivity)     Status: Abnormal   Collection Time: 06/24/23  4:00 PM  Result Value Ref Range   Troponin I (High Sensitivity) 22 (H) <18 ng/L    Comment: (NOTE) Elevated high sensitivity troponin I (hsTnI) values and significant  changes across serial measurements may suggest ACS but many other  chronic and acute conditions are known to elevate hsTnI results.  Refer to the "Links" section for chest pain algorithms and additional  guidance. Performed at [facility], [address], [place], [place] [address]   Procalcitonin     Status: None   Collection Time: 06/24/23  4:00 PM  Result Value Ref Range   Procalcitonin 0.88 ng/mL    Comment:        Interpretation: PCT > 0.5 ng/mL and <= 2 ng/mL: Systemic infection (sepsis) is possible, but other conditions are known to elevate PCT as well. (NOTE)  Sepsis PCT Algorithm           Lower Respiratory Tract                                      Infection PCT Algorithm    ----------------------------     ----------------------------         PCT < 0.25 ng/mL                PCT < 0.10 ng/mL          Strongly encourage             Strongly discourage   discontinuation of antibiotics    initiation of antibiotics    ----------------------------     -----------------------------       PCT 0.25 - 0.50 ng/mL            PCT 0.10 - 0.25 ng/mL               OR       >80%  decrease in PCT            Discourage initiation of                                            antibiotics      Encourage discontinuation           of antibiotics    ----------------------------     -----------------------------         PCT >= 0.50 ng/mL              PCT 0.26 - 0.50 ng/mL                AND       <80% decrease in PCT             Encourage initiation of                                             antibiotics       Encourage continuation           of antibiotics    ----------------------------     -----------------------------        PCT >= 0.50 ng/mL                  PCT > 0.50 ng/mL               AND         increase in PCT                  Strongly encourage                                      initiation of antibiotics    Strongly encourage escalation           of antibiotics                                     -----------------------------  PCT <= 0.25 ng/mL                                                 OR                                        > 80% decrease in PCT                                      Discontinue / Do not initiate                                             antibiotics  Performed at [facility], [address], [place], [place] [address]   Culture, blood (routine x 2)     Status: None (Preliminary result)   Collection Time: 06/24/23  4:41 PM   Specimen: BLOOD  Result Value Ref Range   Specimen Description BLOOD LEFT UPPER ARM    Special Requests      BOTTLES DRAWN AEROBIC AND ANAEROBIC Blood Culture adequate volume   Culture      NO GROWTH < 24 HOURS Performed at [facility], [address], [place], [place] [address]    Report Status PENDING   Culture, blood (routine x 2)     Status: None (Preliminary result)   Collection Time: 06/24/23  4:41 PM   Specimen: BLOOD  Result Value Ref Range   Specimen Description BLOOD SITE NOT SPECIFIED    Special Requests      BOTTLES DRAWN  AEROBIC AND ANAEROBIC Blood Culture results may not be optimal due to an inadequate volume of blood received in culture bottles   Culture      NO GROWTH < 24 HOURS Performed at [facility], [address] [place], [place] [address]    Report Status PENDING   Renal function panel     Status: Abnormal   Collection Time: 06/25/23  5:03 AM  Result Value Ref Range   Sodium 134 (L) 135 - 145 mmol/L   Potassium 4.3 3.5 - 5.1 mmol/L   Chloride 102 98 - 111 mmol/L   CO2 16 (L) 22 - 32 mmol/L   Glucose, Bld 130 (H) 70 - 99 mg/dL    Comment: Glucose reference range applies only to samples taken after fasting for at least 8 hours.   BUN 79 (H) 8 - 23 mg/dL   Creatinine, Ser 9.14 (H) 0.44 - 1.00 mg/dL   Calcium 7.7 (L) 8.9 - 10.3 mg/dL   Phosphorus 7.4 (H) 2.5 - 4.6 mg/dL   Albumin 2.4 (L) 3.5 - 5.0 g/dL   GFR, Estimated 8 (L) >60 mL/min    Comment: (NOTE) Calculated using the CKD-EPI Creatinine Equation (2021)    Anion gap 16 (H) 5 - 15    Comment: Performed at [facility], [address] [place], [place] [address]  CBC     Status: Abnormal   Collection Time: 06/25/23  5:03 AM  Result Value Ref Range   WBC 7.1  4.0 - 10.5 K/uL   RBC 2.82 (L) 3.87 - 5.11 MIL/uL   Hemoglobin 8.7 (L) 12.0 - 15.0 g/dL   HCT 45.4 (L) 09.8 - 11.9 %   MCV 91.5 80.0 - 100.0 fL   MCH 30.9 26.0 - 34.0 pg   MCHC 33.7 30.0 - 36.0 g/dL   RDW 14.7 (H) 82.9 - 56.2 %   Platelets 398 150 - 400 K/uL   nRBC 0.0 0.0 - 0.2 %    Comment: Performed at [facility], [address] [place], [place] [address]    DG Chest Portable 1 View Result Date: 06/24/2023 CLINICAL DATA:  Cough. EXAM: PORTABLE CHEST 1 VIEW COMPARISON:  June 12, 2023. FINDINGS: Stable cardiomegaly with mild central pulmonary vascular congestion. Small left pleural effusion is noted with associated right basilar atelectasis or infiltrate. Minimal left basilar pulmonary edema is noted with probable associated pleural effusion. Bony thorax  is unremarkable. IMPRESSION: Stable cardiomegaly with mild central pulmonary vascular congestion. Minimal left basilar pulmonary edema is noted with probable associated pleural effusion. Small left pleural effusion is noted with associated right basilar atelectasis or infiltrate. Electronically Signed   By: Lupita Raider M.D.   On: 06/24/2023 15:59    ROS: all other systems reviewed and are negative except as per HPI Blood pressure (!) 142/62, pulse 93, temperature 98.2 F (36.8 C), temperature source Oral, resp. rate (!) 24, height 5\' 7"  (1.702 m), weight 54.9 kg, SpO2 97%. . GEN NAD, chronically ill-appearing HEENT EOMI PERRL NECK + JVD up to angle of mandible PULM bilateral crackles CV RRR ABD soft EXT no LE edema, thin, sarcopenic NEURO AAO x 3 nonfocal SKIN thinned  Assessment/Plan: 1 Acute hypoxic RF:  looks like pulm edema, on CAP coverage as well 2 ESRD:  no regular dialysis.  Does request a treatment today.  Orders written 3 Hypertension: resume oP meds 4. Anemia of ESRD: Aranesp today 5. Metabolic Bone Disease: calcitriol 6.  Dispo: pending  Bufford Buttner 06/25/2023, 10:42 AM

## 2023-06-25 NOTE — Progress Notes (Signed)
 ENGLISH TOMER  ZOX:[id number] DOB: November 01, 1960 DOA: 06/24/2023 PCP: [name], MD    Brief Narrative:  63 year old with a history of HTN, HLD, GERD, PUD, GI bleeding, hepatitis C, substance abuse, anxiety/depression, COPD, ESRD with noncompliance with HD MWF, and chronic pain who presented to the ER with shortness of breath following 2 recent similar admissions related to outpatient HD noncompliance and volume overload.  She again admitted to not having attended dialysis for at least a couple of weeks.  She continues to smoke.  At presentation her oxygen saturation was 86% on room air.  Goals of Care:   Code Status: Do not attempt resuscitation (DNR) PRE-ARREST INTERVENTIONS DESIRED   DVT prophylaxis: Place and maintain sequential compression device Start: 06/24/23 1653   Interim Hx: Afebrile.  Vital signs stable.  Oxygen saturation 97% on 2 L nasal cannula.  Reports ongoing shortness of breath but is not an extremis.  Confirms to me that she remains homeless at this time.  Assessment & Plan:  Acute hypoxic respiratory failure due to fluid overload due to dialysis noncompliance Appears relatively stable at present -awaiting dialysis for definitive correction  Hyperkalemia due to dialysis noncompliance Corrected  ESRD  Does not appear that she is on chronic dialysis related to her homelessness -nephrology knows the patient well and continues to work to assist her  Anemia of chronic kidney disease No evidence of active bleeding at present  HTN Continue usual home medications  HLD Continue atorvastatin  Anxiety/depression Continue usual buspirone and trazodone  History of PUD, gastritis, and AVMs Continue PPI  Tobacco abuse Has been counseled on the absolute need to discontinue tobacco abuse  Family Communication: No family present at time of exam Disposition: Anticipate medical stability for discharge once dialysis completed though disposition will be challenging  given her homelessness and need for dialysis   Objective: Blood pressure (!) 142/62, pulse 93, temperature 98.2 F (36.8 C), temperature source Oral, resp. rate (!) 24, height 5\' 7"  (1.702 m), weight 54.9 kg, SpO2 97%.  Intake/Output Summary (Last 24 hours) at 06/25/2023 1117 Last data filed at 06/25/2023 0931 Gross per 24 hour  Intake 730 ml  Output 0 ml  Net 730 ml   Filed Weights   06/24/23 1409  Weight: 54.9 kg    Examination: General: No acute respiratory distress Lungs: Fine crackles diffusely bilaterally with no wheezing Cardiovascular: Regular rate and rhythm without murmur gallop or rub normal S1 and S2 Abdomen: Nontender, nondistended, soft, bowel sounds positive, no rebound, no ascites, no appreciable mass Extremities: No significant cyanosis, clubbing, or edema bilateral lower extremities  CBC: Recent Labs  Lab 06/24/23 1410 06/24/23 1429 06/25/23 0503  WBC 10.7*  --  7.1  NEUTROABS 8.9*  --   --   HGB 9.3* 9.9* 8.7*  HCT 29.9* 29.0* 25.8*  MCV 96.5  --  91.5  PLT 445*  --  398   Basic Metabolic Panel: Recent Labs  Lab 06/24/23 1410 06/24/23 1429 06/25/23 0503  NA 130* 131* 134*  K 5.7* 5.6* 4.3  CL 99 105 102  CO2 14*  --  16*  GLUCOSE 83 80 130*  BUN 73* 76* 79*  CREATININE 5.65* 6.40* 5.63*  CALCIUM 8.0*  --  7.7*  PHOS  --   --  7.4*   GFR: Estimated Creatinine Clearance: 9 mL/min (A) (by C-G formula based on SCr of 5.63 mg/dL (H)).   Scheduled Meds:  albuterol  2.5 mg Nebulization BID   amLODipine  10 mg Oral Daily   atorvastatin  10 mg Oral Daily   budeson-glycopyrrolate-formoterol  2 puff Inhalation BID   Chlorhexidine Gluconate Cloth  6 each Topical Q0600   darbepoetin (ARANESP) injection - DIALYSIS  60 mcg Subcutaneous Q Wed-1800   ferrous sulfate  325 mg Oral Q breakfast   nicotine  14 mg Transdermal Daily   pantoprazole  40 mg Oral BID   predniSONE  40 mg Oral Q breakfast   sodium bicarbonate  1,300 mg Oral BID   sodium  chloride flush  3 mL Intravenous Q12H   torsemide  100 mg Oral Daily   Continuous Infusions:   LOS: 1 day   Lonia Blood, MD [facility] Office  [phone] Pager - Text Page per Loretha Stapler  If 7PM-7AM, please contact night-coverage per Amion 06/25/2023, 11:17 AM

## 2023-06-25 NOTE — Plan of Care (Signed)
  Problem: Health Behavior/Discharge Planning: Goal: Ability to manage health-related needs will improve Outcome: Progressing   Problem: Clinical Measurements: Goal: Ability to maintain clinical measurements within normal limits will improve Outcome: Progressing Goal: Will remain free from infection Outcome: Progressing Goal: Diagnostic test results will improve Outcome: Progressing Goal: Respiratory complications will improve Outcome: Progressing Goal: Cardiovascular complication will be avoided Outcome: Progressing   Problem: Activity: Goal: Risk for activity intolerance will decrease Outcome: Progressing   Problem: Nutrition: Goal: Adequate nutrition will be maintained Outcome: Progressing   Problem: Coping: Goal: Level of anxiety will decrease Outcome: Progressing   Problem: Elimination: Goal: Will not experience complications related to bowel motility Outcome: Progressing Goal: Will not experience complications related to urinary retention Outcome: Progressing   Problem: Pain Managment: Goal: General experience of comfort will improve and/or be controlled Outcome: Progressing   Problem: Safety: Goal: Ability to remain free from injury will improve Outcome: Progressing   Problem: Skin Integrity: Goal: Risk for impaired skin integrity will decrease Outcome: Progressing   Problem: Education: Goal: Knowledge of disease and its progression will improve Outcome: Progressing Goal: Individualized Educational Video(s) Outcome: Progressing   Problem: Fluid Volume: Goal: Compliance with measures to maintain balanced fluid volume will improve Outcome: Progressing   Problem: Health Behavior/Discharge Planning: Goal: Ability to manage health-related needs will improve Outcome: Progressing   Problem: Nutritional: Goal: Ability to make healthy dietary choices will improve Outcome: Progressing   Problem: Clinical Measurements: Goal: Complications related to the  disease process, condition or treatment will be avoided or minimized Outcome: Progressing

## 2023-06-26 ENCOUNTER — Inpatient Hospital Stay: Admitting: Physician Assistant

## 2023-06-26 LAB — RENAL FUNCTION PANEL
Albumin: 2.6 g/dL — ABNORMAL LOW (ref 3.5–5.0)
Anion gap: 15 (ref 5–15)
BUN: 41 mg/dL — ABNORMAL HIGH (ref 8–23)
CO2: 22 mmol/L (ref 22–32)
Calcium: 7.6 mg/dL — ABNORMAL LOW (ref 8.9–10.3)
Chloride: 97 mmol/L — ABNORMAL LOW (ref 98–111)
Creatinine, Ser: 3.84 mg/dL — ABNORMAL HIGH (ref 0.44–1.00)
GFR, Estimated: 13 mL/min — ABNORMAL LOW (ref >60)
Glucose, Bld: 119 mg/dL — ABNORMAL HIGH (ref 70–99)
Phosphorus: 3.7 mg/dL (ref 2.5–4.6)
Potassium: 3.6 mmol/L (ref 3.5–5.1)
Sodium: 134 mmol/L — ABNORMAL LOW (ref 135–145)

## 2023-06-26 LAB — CBC
HCT: 28.5 % — ABNORMAL LOW (ref 36.0–46.0)
Hemoglobin: 9.5 g/dL — ABNORMAL LOW (ref 12.0–15.0)
MCH: 30.6 pg (ref 26.0–34.0)
MCHC: 33.3 g/dL (ref 30.0–36.0)
MCV: 91.9 fL (ref 80.0–100.0)
Platelets: 412 10*3/uL — ABNORMAL HIGH (ref 150–400)
RBC: 3.1 MIL/uL — ABNORMAL LOW (ref 3.87–5.11)
RDW: 19 % — ABNORMAL HIGH (ref 11.5–15.5)
WBC: 8.3 10*3/uL (ref 4.0–10.5)
nRBC: 0 % (ref 0.0–0.2)

## 2023-06-26 LAB — HEPATITIS B SURFACE ANTIBODY, QUANTITATIVE: Hep B S AB Quant (Post): 3.7 m[IU]/mL — ABNORMAL LOW

## 2023-06-26 MED ORDER — OXYCODONE HCL 5 MG PO TABS
5.0000 mg | ORAL_TABLET | ORAL | Status: DC | PRN
  Administered 2023-06-26 – 2023-06-30 (×13): 5 mg via ORAL
  Filled 2023-06-26 (×14): qty 1

## 2023-06-26 MED ORDER — PREDNISONE 20 MG PO TABS
40.0000 mg | ORAL_TABLET | Freq: Two times a day (BID) | ORAL | Status: DC
  Administered 2023-06-26 – 2023-06-29 (×6): 40 mg via ORAL
  Filled 2023-06-26 (×6): qty 2

## 2023-06-26 MED ORDER — CARVEDILOL 3.125 MG PO TABS
3.1250 mg | ORAL_TABLET | Freq: Two times a day (BID) | ORAL | Status: DC
  Administered 2023-06-26 – 2023-06-28 (×3): 3.125 mg via ORAL
  Filled 2023-06-26 (×3): qty 1

## 2023-06-26 NOTE — Progress Notes (Signed)
 Completed 3 hours of dialysis. Tolerated net fluid removal of 3L. VS stable, no acute events. 2K+ dialysate bath used per MD for K level of 4.3 All blood returned safely. RUA AVF needles x2 de-accessed. Manual pressure applied to sites until hemostasis was achieved. Sites secured with gauze/taped. Patient is alert and conversant. In stable condition upon leaving dialysis unit. Post HD report given to [facility].

## 2023-06-26 NOTE — Progress Notes (Addendum)
 [name]  MVH:[id number] DOB: 1960-06-22 DOA: 06/24/2023 PCP: [name], MD    Brief Narrative:  63 year old with a history of HTN, HLD, GERD, PUD, GI bleeding, hepatitis C, substance abuse, anxiety/depression, COPD, ESRD with noncompliance with HD MWF, and chronic pain who presented to the ER with shortness of breath following 2 recent similar admissions related to outpatient HD noncompliance and volume overload.  She again admitted to not having attended dialysis for at least a couple of weeks.  She continues to smoke.  At presentation her oxygen saturation was 86% on room air.  Goals of Care:   Code Status: Do not attempt resuscitation (DNR) PRE-ARREST INTERVENTIONS DESIRED   DVT prophylaxis: Place and maintain sequential compression device Start: 06/24/23 1653   Interim Hx: Completed 3 hours of dialysis early this a.m. with net 3 L fluid removal.  Afebrile.  Blood pressure elevated at 162-187.  Oxygen saturation stable on 1 L nasal cannula.  States that she feels only slightly better after dialysis treatment last night.  Reports that he usually takes 2 dialysis treatments before she begins to feel significantly better.  No chest pain.  Persistent hacking dry cough.  Assessment & Plan:  Acute hypoxic respiratory failure due to fluid overload due to dialysis noncompliance Improving rapidly with dialysis -continue to wean O2 as able  Hyperkalemia due to dialysis noncompliance Corrected  Hyponatremia Hypervolemic in nature - correcting w/ HD  ESRD  Does not appear that she is on chronic dialysis related to her homelessness -nephrology knows the patient well and continues to work to assist her -TOC and renal navigator working to do with a can with the patient  Anemia of chronic kidney disease No evidence of active bleeding at present -hemoglobin stable  HTN Should improve with consistent dialysis -adjust medical therapy for now given persisting  hypertension  HLD Continue atorvastatin  Anxiety/depression Continue usual buspirone and trazodone  History of PUD, gastritis, and AVMs Continue PPI  Tobacco abuse Has been counseled on the absolute need to discontinue tobacco abuse  Family Communication: No family present at time of exam Disposition: Anticipate medical stability for discharge once dialysis completed 4/11 though disposition will be challenging given her homelessness and need for dialysis   Objective: Blood pressure (!) 187/73, pulse 93, temperature 97.9 F (36.6 C), temperature source Oral, resp. rate 18, height 5\' 7"  (1.702 m), weight 54.9 kg, SpO2 97%.  Intake/Output Summary (Last 24 hours) at 06/26/2023 1042 Last data filed at 06/26/2023 0253 Gross per 24 hour  Intake 440 ml  Output 0 ml  Net 440 ml   Filed Weights   06/24/23 1409  Weight: 54.9 kg    Examination: General: No acute respiratory distress Lungs: Fine crackles diffusely bilaterally but with improved air movement throughout Cardiovascular: Regular rate and rhythm without murmur gallop or rub normal S1 and S2 Abdomen: Nontender, nondistended, soft, bowel sounds positive, no rebound, no ascites, no appreciable mass Extremities: No significant cyanosis, clubbing, or edema bilateral lower extremities  CBC: Recent Labs  Lab 06/24/23 1410 06/24/23 1429 06/25/23 0503 06/26/23 0505  WBC 10.7*  --  7.1 8.3  NEUTROABS 8.9*  --   --   --   HGB 9.3* 9.9* 8.7* 9.5*  HCT 29.9* 29.0* 25.8* 28.5*  MCV 96.5  --  91.5 91.9  PLT 445*  --  398 412*   Basic Metabolic Panel: Recent Labs  Lab 06/24/23 1410 06/24/23 1429 06/25/23 0503 06/26/23 0505  NA 130* 131* 134* 134*  K 5.7* 5.6* 4.3 3.6  CL 99 105 102 97*  CO2 14*  --  16* 22  GLUCOSE 83 80 130* 119*  BUN 73* 76* 79* 41*  CREATININE 5.65* 6.40* 5.63* 3.84*  CALCIUM 8.0*  --  7.7* 7.6*  PHOS  --   --  7.4* 3.7   GFR: Estimated Creatinine Clearance: 13.2 mL/min (A) (by C-G formula  based on SCr of 3.84 mg/dL (H)).   Scheduled Meds:  albuterol  2.5 mg Nebulization BID   amLODipine  10 mg Oral Daily   atorvastatin  10 mg Oral Daily   budeson-glycopyrrolate-formoterol  2 puff Inhalation BID   busPIRone  10 mg Oral BID   Chlorhexidine Gluconate Cloth  6 each Topical Q0600   darbepoetin (ARANESP) injection - DIALYSIS  60 mcg Subcutaneous Q Wed-1800   ferrous sulfate  325 mg Oral Q breakfast   gabapentin  100 mg Oral TID   nicotine  14 mg Transdermal Daily   pantoprazole  40 mg Oral BID   predniSONE  40 mg Oral Q breakfast   sodium bicarbonate  1,300 mg Oral BID   sodium chloride flush  3 mL Intravenous Q12H   torsemide  100 mg Oral Daily     LOS: 2 days   Lonia Blood, MD [facility] Office  [phone] Pager - Text Page per Loretha Stapler  If 7PM-7AM, please contact night-coverage per Amion 06/26/2023, 10:42 AM

## 2023-06-26 NOTE — Progress Notes (Signed)
 [facility] Progress Note   Assessment/ Plan:   1 Acute hypoxic RF:  looks like pulm edema, on CAP coverage as well 2 ESRD:  no regular dialysis.  HD 06/25/23, next HD tomorrow 3 Hypertension: resume oP meds 4. Anemia of ESRD: Aranesp today 5. Metabolic Bone Disease: calcitriol 6.  Dispo: pending  Subjective:    Seen in room.  Got 3 hrs of HD yesterday, 3L removed.  Coughing this AM.  Unclear if she's interested in OP dialysis- has previously not been successful- I have let renal navigator know that she's here.  Of note, she would not be eligible to come to [facility] in [place].     Objective:   BP 139/68 (BP Location: Left Arm)   Pulse 93   Temp 97.9 F (36.6 C) (Oral)   Resp 18   Ht 5\' 7"  (1.702 m)   Wt 54.9 kg   SpO2 97%   BMI 18.96 kg/m   Intake/Output Summary (Last 24 hours) at 06/26/2023 1257 Last data filed at 06/26/2023 0253 Gross per 24 hour  Intake 440 ml  Output 0 ml  Net 440 ml   Weight change:   Physical Exam: GEN NAD, chronically ill-appearing HEENT EOMI PERRL NECK + JVD up to angle of mandible PULM bilateral crackles, L sided wheezes CV RRR ABD soft EXT no LE edema, thin, sarcopenic NEURO AAO x 3 nonfocal SKIN thinned  Imaging: DG Chest Portable 1 View Result Date: 06/24/2023 CLINICAL DATA:  Cough. EXAM: PORTABLE CHEST 1 VIEW COMPARISON:  June 12, 2023. FINDINGS: Stable cardiomegaly with mild central pulmonary vascular congestion. Small left pleural effusion is noted with associated right basilar atelectasis or infiltrate. Minimal left basilar pulmonary edema is noted with probable associated pleural effusion. Bony thorax is unremarkable. IMPRESSION: Stable cardiomegaly with mild central pulmonary vascular congestion. Minimal left basilar pulmonary edema is noted with probable associated pleural effusion. Small left pleural effusion is noted with associated right basilar atelectasis or infiltrate. Electronically Signed   By: Lupita Raider M.D.   On: 06/24/2023 15:59    Labs: BMET Recent Labs  Lab 06/24/23 1410 06/24/23 1429 06/25/23 0503 06/26/23 0505  NA 130* 131* 134* 134*  K 5.7* 5.6* 4.3 3.6  CL 99 105 102 97*  CO2 14*  --  16* 22  GLUCOSE 83 80 130* 119*  BUN 73* 76* 79* 41*  CREATININE 5.65* 6.40* 5.63* 3.84*  CALCIUM 8.0*  --  7.7* 7.6*  PHOS  --   --  7.4* 3.7   CBC Recent Labs  Lab 06/24/23 1410 06/24/23 1429 06/25/23 0503 06/26/23 0505  WBC 10.7*  --  7.1 8.3  NEUTROABS 8.9*  --   --   --   HGB 9.3* 9.9* 8.7* 9.5*  HCT 29.9* 29.0* 25.8* 28.5*  MCV 96.5  --  91.5 91.9  PLT 445*  --  398 412*    Medications:     albuterol  2.5 mg Nebulization BID   amLODipine  10 mg Oral Daily   atorvastatin  10 mg Oral Daily   budeson-glycopyrrolate-formoterol  2 puff Inhalation BID   busPIRone  10 mg Oral BID   carvedilol  3.125 mg Oral BID WC   Chlorhexidine Gluconate Cloth  6 each Topical Q0600   darbepoetin (ARANESP) injection - DIALYSIS  60 mcg Subcutaneous Q Wed-1800   ferrous sulfate  325 mg Oral Q breakfast   gabapentin  100 mg Oral TID   nicotine  14 mg Transdermal  Daily   pantoprazole  40 mg Oral BID   predniSONE  40 mg Oral Q breakfast   sodium bicarbonate  1,300 mg Oral BID   sodium chloride flush  3 mL Intravenous Q12H   torsemide  100 mg Oral Daily    Bufford Buttner, MD 06/26/2023, 12:57 PM

## 2023-06-26 NOTE — Progress Notes (Signed)
 Per floor RN pt refusing HD states arm is hurting and does not want to do HD 2 days in a row MD notified

## 2023-06-26 NOTE — Plan of Care (Signed)
  Problem: Health Behavior/Discharge Planning: Goal: Ability to manage health-related needs will improve Outcome: Progressing   Problem: Clinical Measurements: Goal: Ability to maintain clinical measurements within normal limits will improve Outcome: Progressing Goal: Will remain free from infection Outcome: Progressing Goal: Diagnostic test results will improve Outcome: Progressing Goal: Respiratory complications will improve Outcome: Progressing Goal: Cardiovascular complication will be avoided Outcome: Progressing   Problem: Activity: Goal: Risk for activity intolerance will decrease Outcome: Progressing   Problem: Nutrition: Goal: Adequate nutrition will be maintained Outcome: Progressing   Problem: Coping: Goal: Level of anxiety will decrease Outcome: Progressing   Problem: Elimination: Goal: Will not experience complications related to bowel motility Outcome: Progressing Goal: Will not experience complications related to urinary retention Outcome: Progressing   Problem: Pain Managment: Goal: General experience of comfort will improve and/or be controlled Outcome: Progressing   Problem: Safety: Goal: Ability to remain free from injury will improve Outcome: Progressing   Problem: Skin Integrity: Goal: Risk for impaired skin integrity will decrease Outcome: Progressing   Problem: Education: Goal: Knowledge of disease and its progression will improve Outcome: Progressing Goal: Individualized Educational Video(s) Outcome: Progressing   Problem: Fluid Volume: Goal: Compliance with measures to maintain balanced fluid volume will improve Outcome: Progressing   Problem: Health Behavior/Discharge Planning: Goal: Ability to manage health-related needs will improve Outcome: Progressing   Problem: Nutritional: Goal: Ability to make healthy dietary choices will improve Outcome: Progressing   Problem: Clinical Measurements: Goal: Complications related to the  disease process, condition or treatment will be avoided or minimized Outcome: Progressing

## 2023-06-27 LAB — RENAL FUNCTION PANEL
Albumin: 2.5 g/dL — ABNORMAL LOW (ref 3.5–5.0)
Anion gap: 13 (ref 5–15)
BUN: 64 mg/dL — ABNORMAL HIGH (ref 8–23)
CO2: 22 mmol/L (ref 22–32)
Calcium: 7.9 mg/dL — ABNORMAL LOW (ref 8.9–10.3)
Chloride: 100 mmol/L (ref 98–111)
Creatinine, Ser: 4.61 mg/dL — ABNORMAL HIGH (ref 0.44–1.00)
GFR, Estimated: 10 mL/min — ABNORMAL LOW (ref >60)
Glucose, Bld: 151 mg/dL — ABNORMAL HIGH (ref 70–99)
Phosphorus: 3.4 mg/dL (ref 2.5–4.6)
Potassium: 4.6 mmol/L (ref 3.5–5.1)
Sodium: 135 mmol/L (ref 135–145)

## 2023-06-27 MED ORDER — ANTICOAGULANT SODIUM CITRATE 4% (200MG/5ML) IV SOLN: 5.0000 mL | Status: DC | PRN

## 2023-06-27 MED ORDER — HEPARIN SODIUM (PORCINE) 1000 UNIT/ML DIALYSIS
2000.0000 [IU] | Freq: Once | INTRAMUSCULAR | Status: AC
  Administered 2023-06-27: 2000 [IU] via INTRAVENOUS_CENTRAL
  Filled 2023-06-27: qty 2

## 2023-06-27 MED ORDER — LIDOCAINE HCL (PF) 1 % IJ SOLN: 5.0000 mL | INTRAMUSCULAR | Status: DC | PRN

## 2023-06-27 MED ORDER — LIDOCAINE-PRILOCAINE 2.5-2.5 % EX CREA: 1.0000 | TOPICAL_CREAM | CUTANEOUS | Status: DC | PRN

## 2023-06-27 MED ORDER — HEPARIN SODIUM (PORCINE) 1000 UNIT/ML DIALYSIS: 1000.0000 [IU] | INTRAMUSCULAR | Status: DC | PRN

## 2023-06-27 MED ORDER — ALTEPLASE 2 MG IJ SOLR: 2.0000 mg | Freq: Once | INTRAMUSCULAR | Status: DC | PRN

## 2023-06-27 MED ORDER — PENTAFLUOROPROP-TETRAFLUOROETH EX AERO: 1.0000 | INHALATION_SPRAY | CUTANEOUS | Status: DC | PRN

## 2023-06-27 NOTE — Progress Notes (Signed)
 Pt known to navigator from past admissions. Met with pt at bedside this afternoon. Pt confirms that she still does not have stable housing or transportation. For this reason, pt states she cannot get to out-pt HD treatments. Pt has been accepted at 2 out-pt HD clinics in the past and was not compliant with treatments at either clinic, therefore pt was d/c from clinics. Pt has been an area denial for [facility] in the past due to pt's non-compliance. Should pt become agreeable to out-pt HD, referral can be made for possible options. Will assist as needed.   Olivia Canter Renal Navigator [phone]

## 2023-06-27 NOTE — Progress Notes (Signed)
 [name]  ZOX:[id number] DOB: 03/26/1960 DOA: 06/24/2023 PCP: [name], MD    Brief Narrative:  63 year old with a history of HTN, HLD, GERD, PUD, GI bleeding, hepatitis C, substance abuse, anxiety/depression, COPD, ESRD with noncompliance with HD MWF, and chronic pain who presented to the ER with shortness of breath following 2 recent similar admissions related to outpatient HD noncompliance and volume overload.  She again admitted to not having attended dialysis for at least a couple of weeks.  She continues to smoke.  At presentation her oxygen saturation was 86% on room air.  Goals of Care:   Code Status: Do not attempt resuscitation (DNR) PRE-ARREST INTERVENTIONS DESIRED   DVT prophylaxis: Place and maintain sequential compression device Start: 06/24/23 1653   Interim Hx: The patient refused dialysis yesterday evening.  Afebrile.  Blood pressure elevated at 166/68.  Oxygen saturation 95-98% on 2 L.  The patient is tells me she feels better than when she first came in but is still short of breath.  I have explained to her that dialysis will be the thing that will correct her shortness of breath and that if she refuses dialysis there will be no reason for her to be in the hospital and no expectation that her shortness of breath will get better.  She tells me she will not consider dialysis more than every other day.  I have explained to her that this may not be adequate to control her symptoms but she persist in telling me that she will not consider dialysis any more frequently than every other day.  I have explained to her that if she continues to to refuse treatment we may be left with no option but to discharge her from the hospital.  Assessment & Plan:  Acute hypoxic respiratory failure due to fluid overload due to dialysis noncompliance/unavailability Improved significantly after initial dialysis treatment but unfortunately she refused her second dialysis treatment -to  receive dialysis tomorrow with the patient assures me she will comply   Hyperkalemia due to dialysis noncompliance Corrected  Hyponatremia Hypervolemic in nature - corrected w/ HD  ESRD  Does not appear that she is on chronic dialysis related to her homelessness - nephrology knows the patient well and continues to work to assist her -TOC and renal navigator working together to help as best they can, but at this point it appears that outpatient dialysis is not going to be option  Anemia of chronic kidney disease No evidence of active bleeding at present -hemoglobin stable  HTN Should improve with consistent dialysis, but unfortunately patient has been refusing consistent dialysis -follow-up without change in treatment today to avoid hypotension should the patient decide to allow dialysis  HLD Continue atorvastatin  Anxiety/depression Continue usual buspirone and trazodone  History of PUD, gastritis, and AVMs Continue PPI  Tobacco abuse Has been counseled on the absolute need to discontinue tobacco abuse  Family Communication: No family present at time of exam Disposition: Not stable for discharge today because patient refused dialysis yesterday -nonetheless disposition will be challenging given her homelessness and need for dialysis   Objective: Blood pressure (!) 166/68, pulse 98, temperature 98.1 F (36.7 C), resp. rate 18, height 5\' 7"  (1.702 m), weight 54.9 kg, SpO2 95%. No intake or output data in the 24 hours ending 06/27/23 1039  Filed Weights   06/24/23 1409  Weight: 54.9 kg    Examination: General: No acute respiratory distress Lungs: Fine crackles diffusely bilaterally-no wheezing Cardiovascular: Regular rate and  rhythm without murmur gallop or rub normal S1 and S2 Abdomen: NT/ND, soft, BS positive, no rebound Extremities: No significant cyanosis, clubbing, or edema bilateral lower extremities  CBC: Recent Labs  Lab 06/24/23 1410 06/24/23 1429  06/25/23 0503 06/26/23 0505  WBC 10.7*  --  7.1 8.3  NEUTROABS 8.9*  --   --   --   HGB 9.3* 9.9* 8.7* 9.5*  HCT 29.9* 29.0* 25.8* 28.5*  MCV 96.5  --  91.5 91.9  PLT 445*  --  398 412*   Basic Metabolic Panel: Recent Labs  Lab 06/25/23 0503 06/26/23 0505 06/27/23 0607  NA 134* 134* 135  K 4.3 3.6 4.6  CL 102 97* 100  CO2 16* 22 22  GLUCOSE 130* 119* 151*  BUN 79* 41* 64*  CREATININE 5.63* 3.84* 4.61*  CALCIUM 7.7* 7.6* 7.9*  PHOS 7.4* 3.7 3.4   GFR: Estimated Creatinine Clearance: 11 mL/min (A) (by C-G formula based on SCr of 4.61 mg/dL (H)).   Scheduled Meds:  albuterol  2.5 mg Nebulization BID   amLODipine  10 mg Oral Daily   atorvastatin  10 mg Oral Daily   budeson-glycopyrrolate-formoterol  2 puff Inhalation BID   busPIRone  10 mg Oral BID   carvedilol  3.125 mg Oral BID WC   Chlorhexidine Gluconate Cloth  6 each Topical Q0600   darbepoetin (ARANESP) injection - DIALYSIS  60 mcg Subcutaneous Q Wed-1800   ferrous sulfate  325 mg Oral Q breakfast   gabapentin  100 mg Oral TID   nicotine  14 mg Transdermal Daily   pantoprazole  40 mg Oral BID   predniSONE  40 mg Oral BID WC   sodium bicarbonate  1,300 mg Oral BID   sodium chloride flush  3 mL Intravenous Q12H   torsemide  100 mg Oral Daily     LOS: 3 days   Lonia Blood, MD [facility] Office  [phone] Pager - Text Page per Loretha Stapler  If 7PM-7AM, please contact night-coverage per Amion 06/27/2023, 10:39 AM

## 2023-06-27 NOTE — Progress Notes (Signed)
 Received patient in bed.Alert and oriented x 4.Consent verified.  Access used : Right arm AVF that worked well.  Duration of treatment: 3.5 hours.  Uf goal :  3 L  Medicine given: Clorphenaramine -hydrocodone( Tussionex )  Hand off to the patients night HD nurse.

## 2023-06-27 NOTE — Progress Notes (Signed)
 [facility] Progress Note   Assessment/ Plan:   1 Acute hypoxic RF:  looks like pulm edema, on CAP coverage as well 2 ESRD:  no regular dialysis.  HD 06/25/23 evening, next HD today- refused yesterday 3 Hypertension: resume oP meds 4. Anemia of ESRD: Aranesp resumed 5. Metabolic Bone Disease: calcitriol 6.  Dispo: pending-  does she need to be chronic hospital dialysis ?   Subjective:    Seen in room.   Coughing this AM.  Supposedly refused HD yesterday but says she will go today -  does not mind night HD-  is c/o about her renal diet   Objective:   BP (!) 166/68   Pulse 98   Temp 98.1 F (36.7 C)   Resp 18   Ht 5\' 7"  (1.702 m)   Wt 54.9 kg   SpO2 95%   BMI 18.96 kg/m  No intake or output data in the 24 hours ending 06/27/23 1159  Weight change:   Physical Exam: GEN NAD, chronically ill-appearing HEENT EOMI PERRL NECK + JVD up to angle of mandible PULM bilateral crackles, L sided wheezes CV RRR ABD soft EXT no LE edema, thin, sarcopenic NEURO AAO x 3 nonfocal SKIN thinned  Imaging: No results found.   Labs: BMET Recent Labs  Lab 06/24/23 1410 06/24/23 1429 06/25/23 0503 06/26/23 0505 06/27/23 0607  NA 130* 131* 134* 134* 135  K 5.7* 5.6* 4.3 3.6 4.6  CL 99 105 102 97* 100  CO2 14*  --  16* 22 22  GLUCOSE 83 80 130* 119* 151*  BUN 73* 76* 79* 41* 64*  CREATININE 5.65* 6.40* 5.63* 3.84* 4.61*  CALCIUM 8.0*  --  7.7* 7.6* 7.9*  PHOS  --   --  7.4* 3.7 3.4   CBC Recent Labs  Lab 06/24/23 1410 06/24/23 1429 06/25/23 0503 06/26/23 0505  WBC 10.7*  --  7.1 8.3  NEUTROABS 8.9*  --   --   --   HGB 9.3* 9.9* 8.7* 9.5*  HCT 29.9* 29.0* 25.8* 28.5*  MCV 96.5  --  91.5 91.9  PLT 445*  --  398 412*    Medications:     albuterol  2.5 mg Nebulization BID   amLODipine  10 mg Oral Daily   atorvastatin  10 mg Oral Daily   budeson-glycopyrrolate-formoterol  2 puff Inhalation BID   busPIRone  10 mg Oral BID   carvedilol  3.125 mg Oral BID  WC   Chlorhexidine Gluconate Cloth  6 each Topical Q0600   darbepoetin (ARANESP) injection - DIALYSIS  60 mcg Subcutaneous Q Wed-1800   ferrous sulfate  325 mg Oral Q breakfast   gabapentin  100 mg Oral TID   nicotine  14 mg Transdermal Daily   pantoprazole  40 mg Oral BID   predniSONE  40 mg Oral BID WC   sodium bicarbonate  1,300 mg Oral BID   sodium chloride flush  3 mL Intravenous Q12H   torsemide  100 mg Oral Daily   Staphany Ditton A Tillie Viverette  06/27/2023, 11:59 AM

## 2023-06-27 NOTE — Plan of Care (Signed)
  Problem: Health Behavior/Discharge Planning: Goal: Ability to manage health-related needs will improve Outcome: Progressing   Problem: Clinical Measurements: Goal: Ability to maintain clinical measurements within normal limits will improve Outcome: Progressing Goal: Will remain free from infection Outcome: Progressing Goal: Diagnostic test results will improve Outcome: Progressing Goal: Respiratory complications will improve Outcome: Progressing Goal: Cardiovascular complication will be avoided Outcome: Progressing   Problem: Activity: Goal: Risk for activity intolerance will decrease Outcome: Progressing   Problem: Nutrition: Goal: Adequate nutrition will be maintained Outcome: Progressing   Problem: Coping: Goal: Level of anxiety will decrease Outcome: Progressing   Problem: Elimination: Goal: Will not experience complications related to bowel motility Outcome: Progressing Goal: Will not experience complications related to urinary retention Outcome: Progressing   Problem: Pain Managment: Goal: General experience of comfort will improve and/or be controlled Outcome: Progressing   Problem: Safety: Goal: Ability to remain free from injury will improve Outcome: Progressing   Problem: Skin Integrity: Goal: Risk for impaired skin integrity will decrease Outcome: Progressing   Problem: Education: Goal: Knowledge of disease and its progression will improve Outcome: Progressing Goal: Individualized Educational Video(s) Outcome: Progressing   Problem: Fluid Volume: Goal: Compliance with measures to maintain balanced fluid volume will improve Outcome: Progressing   Problem: Health Behavior/Discharge Planning: Goal: Ability to manage health-related needs will improve Outcome: Progressing   Problem: Nutritional: Goal: Ability to make healthy dietary choices will improve Outcome: Progressing   Problem: Clinical Measurements: Goal: Complications related to the  disease process, condition or treatment will be avoided or minimized Outcome: Progressing

## 2023-06-28 LAB — RENAL FUNCTION PANEL
Albumin: 2.7 g/dL — ABNORMAL LOW (ref 3.5–5.0)
Anion gap: 15 (ref 5–15)
BUN: 32 mg/dL — ABNORMAL HIGH (ref 8–23)
CO2: 27 mmol/L (ref 22–32)
Calcium: 8.4 mg/dL — ABNORMAL LOW (ref 8.9–10.3)
Chloride: 92 mmol/L — ABNORMAL LOW (ref 98–111)
Creatinine, Ser: 3.27 mg/dL — ABNORMAL HIGH (ref 0.44–1.00)
GFR, Estimated: 15 mL/min — ABNORMAL LOW (ref >60)
Glucose, Bld: 99 mg/dL (ref 70–99)
Phosphorus: 3.9 mg/dL (ref 2.5–4.6)
Potassium: 3.2 mmol/L — ABNORMAL LOW (ref 3.5–5.1)
Sodium: 134 mmol/L — ABNORMAL LOW (ref 135–145)

## 2023-06-28 LAB — CBC
HCT: 34 % — ABNORMAL LOW (ref 36.0–46.0)
Hemoglobin: 10.9 g/dL — ABNORMAL LOW (ref 12.0–15.0)
MCH: 29.7 pg (ref 26.0–34.0)
MCHC: 32.1 g/dL (ref 30.0–36.0)
MCV: 92.6 fL (ref 80.0–100.0)
Platelets: 496 10*3/uL — ABNORMAL HIGH (ref 150–400)
RBC: 3.67 MIL/uL — ABNORMAL LOW (ref 3.87–5.11)
RDW: 19.4 % — ABNORMAL HIGH (ref 11.5–15.5)
WBC: 11.3 10*3/uL — ABNORMAL HIGH (ref 4.0–10.5)
nRBC: 0.4 % — ABNORMAL HIGH (ref 0.0–0.2)

## 2023-06-28 MED ORDER — GUAIFENESIN ER 600 MG PO TB12
600.0000 mg | ORAL_TABLET | Freq: Two times a day (BID) | ORAL | Status: DC
  Administered 2023-06-28 – 2023-07-01 (×5): 600 mg via ORAL
  Filled 2023-06-28 (×5): qty 1

## 2023-06-28 MED ORDER — CARVEDILOL 6.25 MG PO TABS
6.2500 mg | ORAL_TABLET | Freq: Two times a day (BID) | ORAL | Status: DC
  Administered 2023-06-28 – 2023-07-01 (×5): 6.25 mg via ORAL
  Filled 2023-06-28 (×5): qty 1

## 2023-06-28 MED ORDER — IPRATROPIUM-ALBUTEROL 0.5-2.5 (3) MG/3ML IN SOLN: 3.0000 mL | Freq: Three times a day (TID) | RESPIRATORY_TRACT | Status: DC

## 2023-06-28 MED ORDER — IPRATROPIUM-ALBUTEROL 0.5-2.5 (3) MG/3ML IN SOLN
3.0000 mL | Freq: Three times a day (TID) | RESPIRATORY_TRACT | Status: AC
  Administered 2023-06-28 – 2023-06-30 (×5): 3 mL via RESPIRATORY_TRACT
  Filled 2023-06-28 (×6): qty 3

## 2023-06-28 MED ORDER — BENZONATATE 100 MG PO CAPS
100.0000 mg | ORAL_CAPSULE | Freq: Three times a day (TID) | ORAL | Status: DC
  Administered 2023-06-28 – 2023-07-01 (×8): 100 mg via ORAL
  Filled 2023-06-28 (×8): qty 1

## 2023-06-28 NOTE — Plan of Care (Signed)
Compliant with care.

## 2023-06-28 NOTE — Progress Notes (Signed)
 [facility] Progress Note   Assessment/ Plan:   1 Acute hypoxic RF:  looks like pulm edema, on CAP coverage as well-   improved with abx and HD 2 ESRD:  no regular dialysis.  HD 06/25/23 evening, then 4/11.  She has no stable home situation and cannot make any effort to attend OP HD.  She has been discharged from OP dialysis due to this fact.  For now she seems to be a default hospital dialysis patient -  will need HD next on 4/14.  Will either do here if she is still inpatient or I have made it clear to her that in order to get further HD she will need to present to [facility] hospital unless her social situation changes 3 Hypertension: resume OP meds-  again social situation is complicating factor 4. Anemia of ESRD: Aranesp resumed 5. Metabolic Bone Disease: calcitriol 6.  Dispo:as above-  OP HD really does not seem to be an option due to unstable home and no effort on her part to partake in OP HD   Subjective:    S/p HD yesterday-  removed 3 liters-  tolerated well -  she is weak and tired-  still coughing.  There is really no option for a stable home situation at discharge   Objective:   BP (!) 165/72 (BP Location: Left Arm)   Pulse 90   Temp (!) 97.4 F (36.3 C) (Oral)   Resp 18   Ht 5\' 7"  (1.702 m)   Wt 54.9 kg   SpO2 94%   BMI 18.96 kg/m   Intake/Output Summary (Last 24 hours) at 06/28/2023 1043 Last data filed at 06/27/2023 2000 Gross per 24 hour  Intake 480 ml  Output 3000 ml  Net -2520 ml    Weight change:   Physical Exam: GEN NAD, chronically ill-appearing HEENT EOMI PERRL NECK + JVD up to angle of mandible PULM bilateral crackles, L sided wheezes CV RRR ABD soft EXT no LE edema, thin, sarcopenic NEURO AAO x 3 nonfocal SKIN thinned  Imaging: No results found.   Labs: BMET Recent Labs  Lab 06/24/23 1410 06/24/23 1429 06/25/23 0503 06/26/23 0505 06/27/23 0607 06/28/23 0738  NA 130* 131* 134* 134* 135 134*  K 5.7* 5.6* 4.3 3.6 4.6 3.2*  CL  99 105 102 97* 100 92*  CO2 14*  --  16* 22 22 27   GLUCOSE 83 80 130* 119* 151* 99  BUN 73* 76* 79* 41* 64* 32*  CREATININE 5.65* 6.40* 5.63* 3.84* 4.61* 3.27*  CALCIUM 8.0*  --  7.7* 7.6* 7.9* 8.4*  PHOS  --   --  7.4* 3.7 3.4 3.9   CBC Recent Labs  Lab 06/24/23 1410 06/24/23 1429 06/25/23 0503 06/26/23 0505 06/28/23 0738  WBC 10.7*  --  7.1 8.3 11.3*  NEUTROABS 8.9*  --   --   --   --   HGB 9.3* 9.9* 8.7* 9.5* 10.9*  HCT 29.9* 29.0* 25.8* 28.5* 34.0*  MCV 96.5  --  91.5 91.9 92.6  PLT 445*  --  398 412* 496*    Medications:     amLODipine  10 mg Oral Daily   atorvastatin  10 mg Oral Daily   budeson-glycopyrrolate-formoterol  2 puff Inhalation BID   busPIRone  10 mg Oral BID   carvedilol  6.25 mg Oral BID WC   Chlorhexidine Gluconate Cloth  6 each Topical Q0600   darbepoetin (ARANESP) injection - DIALYSIS  60 mcg Subcutaneous Q Wed-1800  ferrous sulfate  325 mg Oral Q breakfast   gabapentin  100 mg Oral TID   nicotine  14 mg Transdermal Daily   pantoprazole  40 mg Oral BID   predniSONE  40 mg Oral BID WC   sodium bicarbonate  1,300 mg Oral BID   sodium chloride flush  3 mL Intravenous Q12H   torsemide  100 mg Oral Daily   Jasdeep Kepner A Shanese Riemenschneider  06/28/2023, 10:43 AM

## 2023-06-28 NOTE — Progress Notes (Signed)
 [name]  ZOX:[id number] DOB: 1960/08/11 DOA: 06/24/2023 PCP: [name], MD    Brief Narrative:  63 year old with a history of HTN, HLD, GERD, PUD, GI bleeding, hepatitis C, substance abuse, anxiety/depression, COPD, ESRD with noncompliance with HD MWF, and chronic pain who presented to the ER with shortness of breath following 2 recent similar admissions related to outpatient HD noncompliance and volume overload.  She again admitted to not having attended dialysis for at least a couple of weeks.  She continues to smoke.  At presentation her oxygen saturation was 86% on room air.  Goals of Care:   Code Status: Do not attempt resuscitation (DNR) PRE-ARREST INTERVENTIONS DESIRED   DVT prophylaxis: Place and maintain sequential compression device Start: 06/24/23 1653   Interim Hx: The patient completed 3-1/2 hours of dialysis yesterday evening with ultrafiltrate of 3 L.  Vital signs stable.  Afebrile.  Saturation 94% on room air.  Complains of ongoing dry cough with difficulty getting phlegm up.  Shortness of breath continues to improve primarily due to dialysis.  No chest pain.  Assessment & Plan:  Acute hypoxic respiratory failure due to fluid overload due to dialysis noncompliance/unavailability Improved significantly after initial dialysis treatment but unfortunately she refused her second dialysis treatment -did comply with dialysis last night with significant clinical improvement afterwards -appears stable at this time with resolution of hypoxic failure and successful weaning to room air  Hyperkalemia due to dialysis noncompliance Corrected  Hyponatremia corrected w/ HD  ESRD  Does not appear that she is on chronic dialysis due to her homelessness - nephrology knows the patient well and continues to work to assist her -TOC and renal navigator working together to help as best they can, but at this point it appears that outpatient dialysis is not going to be  option  Anemia of chronic kidney disease No evidence of active bleeding at present -hemoglobin stable  HTN Should improve with consistent dialysis, but unfortunately patient has been refusing consistent dialysis -adjust medical therapy and follow  HLD Continue atorvastatin  Anxiety/depression Continue usual buspirone and trazodone  History of PUD, gastritis, and AVMs Continue PPI  Tobacco abuse Has been counseled on the absolute need to discontinue tobacco abuse  Family Communication: No family present at time of exam Disposition: disposition challenging given her homelessness and need for dialysis -plan at this time is to allow her to have 1 more dialysis treatment 4/14 and then plan for discharge   Objective: Blood pressure (!) 165/72, pulse 90, temperature (!) 97.4 F (36.3 C), temperature source Oral, resp. rate 18, height 5\' 7"  (1.702 m), weight 54.9 kg, SpO2 94%.  Intake/Output Summary (Last 24 hours) at 06/28/2023 0953 Last data filed at 06/27/2023 2000 Gross per 24 hour  Intake 480 ml  Output 3000 ml  Net -2520 ml    Filed Weights   06/24/23 1409  Weight: 54.9 kg    Examination: General: No acute respiratory distress Lungs: Scattered mild crackles with no wheezing and improving air movement throughout Cardiovascular: Regular rate and rhythm without murmur gallop or rub normal S1 and S2 Abdomen: NT/ND, soft, BS positive, no rebound Extremities: No significant edema bilateral lower extremities  CBC: Recent Labs  Lab 06/24/23 1410 06/24/23 1429 06/25/23 0503 06/26/23 0505 06/28/23 0738  WBC 10.7*  --  7.1 8.3 11.3*  NEUTROABS 8.9*  --   --   --   --   HGB 9.3*   < > 8.7* 9.5* 10.9*  HCT 29.9*   < >  25.8* 28.5* 34.0*  MCV 96.5  --  91.5 91.9 92.6  PLT 445*  --  398 412* 496*   < > = values in this interval not displayed.   Basic Metabolic Panel: Recent Labs  Lab 06/26/23 0505 06/27/23 0607 06/28/23 0738  NA 134* 135 134*  K 3.6 4.6 3.2*  CL  97* 100 92*  CO2 22 22 27   GLUCOSE 119* 151* 99  BUN 41* 64* 32*  CREATININE 3.84* 4.61* 3.27*  CALCIUM 7.6* 7.9* 8.4*  PHOS 3.7 3.4 3.9   GFR: Estimated Creatinine Clearance: 15.5 mL/min (A) (by C-G formula based on SCr of 3.27 mg/dL (H)).   Scheduled Meds:  amLODipine  10 mg Oral Daily   atorvastatin  10 mg Oral Daily   budeson-glycopyrrolate-formoterol  2 puff Inhalation BID   busPIRone  10 mg Oral BID   carvedilol  3.125 mg Oral BID WC   Chlorhexidine Gluconate Cloth  6 each Topical Q0600   darbepoetin (ARANESP) injection - DIALYSIS  60 mcg Subcutaneous Q Wed-1800   ferrous sulfate  325 mg Oral Q breakfast   gabapentin  100 mg Oral TID   nicotine  14 mg Transdermal Daily   pantoprazole  40 mg Oral BID   predniSONE  40 mg Oral BID WC   sodium bicarbonate  1,300 mg Oral BID   sodium chloride flush  3 mL Intravenous Q12H   torsemide  100 mg Oral Daily     LOS: 4 days   Abbe Abate, MD [facility] Office  [phone] Pager - Text Page per Tilford Foley  If 7PM-7AM, please contact night-coverage per Amion 06/28/2023, 9:53 AM

## 2023-06-29 LAB — RENAL FUNCTION PANEL
Albumin: 2.6 g/dL — ABNORMAL LOW (ref 3.5–5.0)
Anion gap: 16 — ABNORMAL HIGH (ref 5–15)
BUN: 54 mg/dL — ABNORMAL HIGH (ref 8–23)
CO2: 23 mmol/L (ref 22–32)
Calcium: 8.2 mg/dL — ABNORMAL LOW (ref 8.9–10.3)
Chloride: 93 mmol/L — ABNORMAL LOW (ref 98–111)
Creatinine, Ser: 4.46 mg/dL — ABNORMAL HIGH (ref 0.44–1.00)
GFR, Estimated: 11 mL/min — ABNORMAL LOW (ref >60)
Glucose, Bld: 156 mg/dL — ABNORMAL HIGH (ref 70–99)
Phosphorus: 4.1 mg/dL (ref 2.5–4.6)
Potassium: 3.4 mmol/L — ABNORMAL LOW (ref 3.5–5.1)
Sodium: 132 mmol/L — ABNORMAL LOW (ref 135–145)

## 2023-06-29 LAB — CULTURE, BLOOD (ROUTINE X 2)
Culture: NO GROWTH
Culture: NO GROWTH
Special Requests: ADEQUATE

## 2023-06-29 LAB — MRSA NEXT GEN BY PCR, NASAL: MRSA by PCR Next Gen: DETECTED — AB

## 2023-06-29 MED ORDER — CHLORHEXIDINE GLUCONATE CLOTH 2 % EX PADS: 6.0000 | MEDICATED_PAD | Freq: Every day | CUTANEOUS | Status: DC

## 2023-06-29 MED ORDER — CALCITRIOL 0.25 MCG PO CAPS
0.2500 ug | ORAL_CAPSULE | Freq: Every day | ORAL | Status: DC
  Administered 2023-06-29 – 2023-07-01 (×3): 0.25 ug via ORAL
  Filled 2023-06-29 (×4): qty 1

## 2023-06-29 MED ORDER — SERTRALINE HCL 50 MG PO TABS
25.0000 mg | ORAL_TABLET | Freq: Every day | ORAL | Status: DC
  Administered 2023-06-29 – 2023-07-01 (×3): 25 mg via ORAL
  Filled 2023-06-29 (×4): qty 1

## 2023-06-29 MED ORDER — BUSPIRONE HCL 5 MG PO TABS
10.0000 mg | ORAL_TABLET | Freq: Three times a day (TID) | ORAL | Status: DC
  Administered 2023-06-29 – 2023-07-01 (×6): 10 mg via ORAL
  Filled 2023-06-29 (×6): qty 2

## 2023-06-29 NOTE — Plan of Care (Signed)
Compliant with care.

## 2023-06-29 NOTE — Progress Notes (Signed)
 [name]  EAV:[id number] DOB: 09/01/1960 DOA: 06/24/2023 PCP: Lawrance Presume, MD    Brief Narrative:  63 year old with a history of HTN, HLD, GERD, PUD, GI bleeding, hepatitis C, substance abuse, anxiety/depression, COPD, ESRD with noncompliance with HD MWF, and chronic pain who presented to the ER with shortness of breath following 2 recent similar admissions related to outpatient HD noncompliance and volume overload.  She again admitted to not having attended dialysis for at least a couple of weeks.  She continues to smoke.  At presentation her oxygen saturation was 86% on room air.  Goals of Care:   Code Status: Do not attempt resuscitation (DNR) PRE-ARREST INTERVENTIONS DESIRED   DVT prophylaxis: Place and maintain sequential compression device Start: 06/24/23 1653   Interim Hx: The patient has agreed to receive 1 additional inpatient dialysis treatment 4/14, with subsequent treatments on an outpatient basis to be arranged with arrival at the ER.  No acute events were recorded overnight last night.  Her vitals remained stable.  She is afebrile.  Cough persists but is perhaps slightly better today.    Assessment & Plan:  Acute hypoxic respiratory failure due to fluid overload due to dialysis noncompliance/unavailability Improved significantly after initial dialysis treatment but unfortunately she refused her second dialysis treatment -did comply with dialysis 4/12 night with significant clinical improvement afterwards - plan to complete 1 additional inpatient dialysis treatment 4/14 -attempt to wean to room air today  Hyperkalemia due to dialysis noncompliance Corrected with dialysis compliance  Hyponatremia corrected w/ HD  ESRD  Does not appear that she is on chronic dialysis due to her homelessness - nephrology knows the patient well and continues to work to assist her -TOC and renal navigator working together to help as best they can, but at this point it appears that  outpatient dialysis is not going to be option -patient has been instructed by the nephrology team to return to the ER at [facility] when she requires dialysis treatment  Anemia of chronic kidney disease No evidence of active bleeding at present - hemoglobin stable  HTN Has improved with consistent dialysis and adjustment of medical regimen  HLD Continue atorvastatin  Anxiety/depression Continue usual buspirone and trazodone  History of PUD, gastritis, and AVMs Continue PPI  Tobacco abuse Has been counseled on the absolute need to discontinue tobacco abuse  Family Communication: No family present at time of exam Disposition: disposition challenging given her homelessness and need for dialysis -plan at this time is to allow her to have 1 more dialysis treatment 4/14 and then plan for discharge if she can be successfully weaned from the supplemental oxygen   Objective: Blood pressure 132/82, pulse (!) 105, temperature 97.9 F (36.6 C), resp. rate 18, height 5\' 7"  (1.702 m), weight 54.9 kg, SpO2 95%.  Intake/Output Summary (Last 24 hours) at 06/29/2023 0910 Last data filed at 06/28/2023 1800 Gross per 24 hour  Intake 720 ml  Output --  Net 720 ml    Filed Weights   06/24/23 1409  Weight: 54.9 kg    Examination: General: No acute respiratory distress Lungs: Clear to auscultation today without wheezing Cardiovascular: Regular rate and rhythm  Abdomen: NT/ND, soft, BS positive, no rebound Extremities: No significant edema bilateral lower extremities  CBC: Recent Labs  Lab 06/24/23 1410 06/24/23 1429 06/25/23 0503 06/26/23 0505 06/28/23 0738  WBC 10.7*  --  7.1 8.3 11.3*  NEUTROABS 8.9*  --   --   --   --  HGB 9.3*   < > 8.7* 9.5* 10.9*  HCT 29.9*   < > 25.8* 28.5* 34.0*  MCV 96.5  --  91.5 91.9 92.6  PLT 445*  --  398 412* 496*   < > = values in this interval not displayed.   Basic Metabolic Panel: Recent Labs  Lab 06/26/23 0505 06/27/23 0607 06/28/23 0738   NA 134* 135 134*  K 3.6 4.6 3.2*  CL 97* 100 92*  CO2 22 22 27   GLUCOSE 119* 151* 99  BUN 41* 64* 32*  CREATININE 3.84* 4.61* 3.27*  CALCIUM 7.6* 7.9* 8.4*  PHOS 3.7 3.4 3.9   GFR: Estimated Creatinine Clearance: 15.5 mL/min (A) (by C-G formula based on SCr of 3.27 mg/dL (H)).   Scheduled Meds:  amLODipine  10 mg Oral Daily   atorvastatin  10 mg Oral Daily   benzonatate  100 mg Oral TID   budeson-glycopyrrolate-formoterol  2 puff Inhalation BID   busPIRone  10 mg Oral BID   carvedilol  6.25 mg Oral BID WC   Chlorhexidine Gluconate Cloth  6 each Topical Q0600   darbepoetin (ARANESP) injection - DIALYSIS  60 mcg Subcutaneous Q Wed-1800   ferrous sulfate  325 mg Oral Q breakfast   gabapentin  100 mg Oral TID   guaiFENesin  600 mg Oral BID   ipratropium-albuterol  3 mL Nebulization TID   nicotine  14 mg Transdermal Daily   pantoprazole  40 mg Oral BID   predniSONE  40 mg Oral BID WC   sodium bicarbonate  1,300 mg Oral BID   sodium chloride flush  3 mL Intravenous Q12H   torsemide  100 mg Oral Daily     LOS: 5 days   Abbe Abate, MD [facility] Office  [phone] Pager - Text Page per Tilford Foley  If 7PM-7AM, please contact night-coverage per Amion 06/29/2023, 9:10 AM

## 2023-06-29 NOTE — Progress Notes (Signed)
 [facility] Progress Note   Assessment/ Plan:   1 Acute hypoxic RF:  looks like pulm edema, on CAP coverage as well-   improved with abx and HD 2 ESRD:  no regular dialysis.  HD 06/25/23 evening, then 4/11 evening.  Refuses to do HD back to back. She has no stable home situation and cannot make any effort to attend OP HD.  She has been discharged from OP dialysis due to this fact.  For now she seems to be a default hospital dialysis patient -  will need HD next on 4/14- will plan for hete.   I have made it clear to her that in order to get further HD she will need to present to [facility] hospital unless her social situation changes 3 Hypertension: resume OP meds-  again social situation is complicating factor 4. Anemia of ESRD: Aranesp resumed 5. Metabolic Bone Disease: calcitriol 6.  Dispo:as above-  OP HD really does not seem to be an option due to unstable home and no effort on her part to partake in OP HD -  possible discharge tomorrow after HD   Subjective:    She looks a little better, then will cough for 30 seconds    Objective:   BP 132/82   Pulse (!) 105   Temp 97.9 F (36.6 C)   Resp 18   Ht 5\' 7"  (1.702 m)   Wt 54.9 kg   SpO2 97%   BMI 18.96 kg/m   Intake/Output Summary (Last 24 hours) at 06/29/2023 1159 Last data filed at 06/28/2023 1800 Gross per 24 hour  Intake 720 ml  Output --  Net 720 ml    Weight change:   Physical Exam: GEN NAD, chronically ill-appearing HEENT EOMI PERRL NECK + JVD up to angle of mandible PULM bilateral crackles, L sided wheezes CV RRR ABD soft EXT no LE edema, thin, sarcopenic NEURO AAO x 3 nonfocal SKIN thinned-- AVF  Imaging: No results found.   Labs: BMET Recent Labs  Lab 06/24/23 1410 06/24/23 1429 06/25/23 0503 06/26/23 0505 06/27/23 0607 06/28/23 0738 06/29/23 0846  NA 130* 131* 134* 134* 135 134* 132*  K 5.7* 5.6* 4.3 3.6 4.6 3.2* 3.4*  CL 99 105 102 97* 100 92* 93*  CO2 14*  --  16* 22 22 27 23    GLUCOSE 83 80 130* 119* 151* 99 156*  BUN 73* 76* 79* 41* 64* 32* 54*  CREATININE 5.65* 6.40* 5.63* 3.84* 4.61* 3.27* 4.46*  CALCIUM 8.0*  --  7.7* 7.6* 7.9* 8.4* 8.2*  PHOS  --   --  7.4* 3.7 3.4 3.9 4.1   CBC Recent Labs  Lab 06/24/23 1410 06/24/23 1429 06/25/23 0503 06/26/23 0505 06/28/23 0738  WBC 10.7*  --  7.1 8.3 11.3*  NEUTROABS 8.9*  --   --   --   --   HGB 9.3* 9.9* 8.7* 9.5* 10.9*  HCT 29.9* 29.0* 25.8* 28.5* 34.0*  MCV 96.5  --  91.5 91.9 92.6  PLT 445*  --  398 412* 496*    Medications:     amLODipine  10 mg Oral Daily   atorvastatin  10 mg Oral Daily   benzonatate  100 mg Oral TID   budeson-glycopyrrolate-formoterol  2 puff Inhalation BID   busPIRone  10 mg Oral TID   carvedilol  6.25 mg Oral BID WC   Chlorhexidine Gluconate Cloth  6 each Topical Q0600   darbepoetin (ARANESP) injection - DIALYSIS  60 mcg Subcutaneous  Q Wed-1800   ferrous sulfate  325 mg Oral Q breakfast   gabapentin  100 mg Oral TID   guaiFENesin  600 mg Oral BID   ipratropium-albuterol  3 mL Nebulization TID   nicotine  14 mg Transdermal Daily   pantoprazole  40 mg Oral BID   predniSONE  40 mg Oral BID WC   sertraline  25 mg Oral Daily   sodium bicarbonate  1,300 mg Oral BID   sodium chloride flush  3 mL Intravenous Q12H   torsemide  100 mg Oral Daily   Edu On A Taahir Grisby  06/29/2023, 11:59 AM

## 2023-06-30 LAB — RENAL FUNCTION PANEL
Albumin: 2.5 g/dL — ABNORMAL LOW (ref 3.5–5.0)
Anion gap: 14 (ref 5–15)
BUN: 71 mg/dL — ABNORMAL HIGH (ref 8–23)
CO2: 23 mmol/L (ref 22–32)
Calcium: 8.2 mg/dL — ABNORMAL LOW (ref 8.9–10.3)
Chloride: 93 mmol/L — ABNORMAL LOW (ref 98–111)
Creatinine, Ser: 5.13 mg/dL — ABNORMAL HIGH (ref 0.44–1.00)
GFR, Estimated: 9 mL/min — ABNORMAL LOW (ref >60)
Glucose, Bld: 110 mg/dL — ABNORMAL HIGH (ref 70–99)
Phosphorus: 4.5 mg/dL (ref 2.5–4.6)
Potassium: 3.7 mmol/L (ref 3.5–5.1)
Sodium: 130 mmol/L — ABNORMAL LOW (ref 135–145)

## 2023-06-30 LAB — CBC
HCT: 30.2 % — ABNORMAL LOW (ref 36.0–46.0)
Hemoglobin: 9.9 g/dL — ABNORMAL LOW (ref 12.0–15.0)
MCH: 30.9 pg (ref 26.0–34.0)
MCHC: 32.8 g/dL (ref 30.0–36.0)
MCV: 94.4 fL (ref 80.0–100.0)
Platelets: 481 10*3/uL — ABNORMAL HIGH (ref 150–400)
RBC: 3.2 MIL/uL — ABNORMAL LOW (ref 3.87–5.11)
RDW: 19.2 % — ABNORMAL HIGH (ref 11.5–15.5)
WBC: 16.4 10*3/uL — ABNORMAL HIGH (ref 4.0–10.5)
nRBC: 0.4 % — ABNORMAL HIGH (ref 0.0–0.2)

## 2023-06-30 MED ORDER — MUPIROCIN 2 % EX OINT
1.0000 | TOPICAL_OINTMENT | Freq: Two times a day (BID) | CUTANEOUS | Status: DC
  Administered 2023-06-30 – 2023-07-01 (×2): 1 via NASAL
  Filled 2023-06-30: qty 22

## 2023-06-30 MED ORDER — CHLORHEXIDINE GLUCONATE CLOTH 2 % EX PADS
6.0000 | MEDICATED_PAD | Freq: Every day | CUTANEOUS | Status: DC
  Administered 2023-06-30 – 2023-07-01 (×2): 6 via TOPICAL

## 2023-06-30 MED ORDER — IPRATROPIUM-ALBUTEROL 0.5-2.5 (3) MG/3ML IN SOLN: 3.0000 mL | Freq: Three times a day (TID) | RESPIRATORY_TRACT | Status: DC

## 2023-06-30 MED ORDER — PREDNISONE 20 MG PO TABS
40.0000 mg | ORAL_TABLET | Freq: Every day | ORAL | Status: DC
  Administered 2023-07-01: 40 mg via ORAL
  Filled 2023-06-30 (×2): qty 2

## 2023-06-30 NOTE — Procedures (Signed)
 Received patient in bed to unit.  Alert and oriented.  Informed consent signed and in chart.   TX duration: 3.5 hours  Patient tolerated well.  Transported back to the room  Alert, without acute distress.  Hand-off given to patient's nurse.   Access used: right fistula Access issues: machine clotted off  Total UF removed: 4 liters Medication(s) given: calcitriol, oxycodone   Clover Dao, RN [facility]

## 2023-06-30 NOTE — Progress Notes (Signed)
 [facility] Progress Note   Subjective: seen prior to HD. K+ 3.7 use 4.0 K bath. Denies SOB  Objective Vitals:   06/29/23 2050 06/30/23 0401 06/30/23 0826 06/30/23 0841  BP:  (!) 142/70 (!) 157/68 137/75  Pulse:  71 70 71  Resp:  16 16 (!) 22  Temp:  97.8 F (36.6 C) 98 F (36.7 C)   TempSrc:  Oral    SpO2: 94% 94% 93% 92%  Weight:   56.1 kg   Height:       Physical Exam General: Chronically ill appearing female in NAD Heart: S1,S2 RRR Lungs: Coarse upper airway sounds, decreased in bases with few crackles Abdomen: NABS, NT Extremities:no LE edema Dialysis Access: AVF cannulated   Additional Objective Labs: Basic Metabolic Panel: Recent Labs  Lab 06/28/23 0738 06/29/23 0846 06/30/23 0505  NA 134* 132* 130*  K 3.2* 3.4* 3.7  CL 92* 93* 93*  CO2 27 23 23   GLUCOSE 99 156* 110*  BUN 32* 54* 71*  CREATININE 3.27* 4.46* 5.13*  CALCIUM 8.4* 8.2* 8.2*  PHOS 3.9 4.1 4.5   Liver Function Tests: Recent Labs  Lab 06/24/23 1410 06/25/23 0503 06/28/23 0738 06/29/23 0846 06/30/23 0505  AST 23  --   --   --   --   ALT 14  --   --   --   --   ALKPHOS 106  --   --   --   --   BILITOT 0.5  --   --   --   --   PROT 7.2  --   --   --   --   ALBUMIN 2.9*   < > 2.7* 2.6* 2.5*   < > = values in this interval not displayed.   No results for input(s): "LIPASE", "AMYLASE" in the last 168 hours. CBC: Recent Labs  Lab 06/24/23 1410 06/24/23 1429 06/25/23 0503 06/26/23 0505 06/28/23 0738 06/30/23 0730  WBC 10.7*  --  7.1 8.3 11.3* 16.4*  NEUTROABS 8.9*  --   --   --   --   --   HGB 9.3*   < > 8.7* 9.5* 10.9* 9.9*  HCT 29.9*   < > 25.8* 28.5* 34.0* 30.2*  MCV 96.5  --  91.5 91.9 92.6 94.4  PLT 445*  --  398 412* 496* 481*   < > = values in this interval not displayed.   Blood Culture    Component Value Date/Time   SDES BLOOD LEFT UPPER ARM 06/24/2023 1641   SDES BLOOD SITE NOT SPECIFIED 06/24/2023 1641   SPECREQUEST  06/24/2023 1641    BOTTLES  DRAWN AEROBIC AND ANAEROBIC Blood Culture adequate volume   SPECREQUEST  06/24/2023 1641    BOTTLES DRAWN AEROBIC AND ANAEROBIC Blood Culture results may not be optimal due to an inadequate volume of blood received in culture bottles   CULT  06/24/2023 1641    NO GROWTH 5 DAYS Performed at [facility], [address] [place], [place] [address]    CULT  06/24/2023 1641    NO GROWTH 5 DAYS Performed at [facility], [address], [place], [place] [address]    REPTSTATUS 06/29/2023 FINAL 06/24/2023 1641   REPTSTATUS 06/29/2023 FINAL 06/24/2023 1641    Cardiac Enzymes: No results for input(s): "CKTOTAL", "CKMB", "CKMBINDEX", "TROPONINI" in the last 168 hours. CBG: No results for input(s): "GLUCAP" in the last 168 hours. Iron Studies: No results for input(s): "IRON", "TIBC", "TRANSFERRIN", "  FERRITIN" in the last 72 hours. @lablastinr3 @ Studies/Results: No results found. Medications:   amLODipine  10 mg Oral Daily   atorvastatin  10 mg Oral Daily   benzonatate  100 mg Oral TID   budeson-glycopyrrolate-formoterol  2 puff Inhalation BID   busPIRone  10 mg Oral TID   calcitRIOL  0.25 mcg Oral Daily   carvedilol  6.25 mg Oral BID WC   Chlorhexidine Gluconate Cloth  6 each Topical Daily   darbepoetin (ARANESP) injection - DIALYSIS  60 mcg Subcutaneous Q Wed-1800   ferrous sulfate  325 mg Oral Q breakfast   gabapentin  100 mg Oral TID   guaiFENesin  600 mg Oral BID   ipratropium-albuterol  3 mL Nebulization TID   mupirocin ointment  1 Application Nasal BID   nicotine  14 mg Transdermal Daily   pantoprazole  40 mg Oral BID   predniSONE  40 mg Oral BID WC   sertraline  25 mg Oral Daily   sodium bicarbonate  1,300 mg Oral BID   sodium chloride flush  3 mL Intravenous Q12H   torsemide  100 mg Oral Daily     Assessment/Plan: 1 Acute hypoxic RF:  looks like pulm edema, on CAP coverage as well-   improved with abx and HD 2 ESRD:  no regular dialysis.  HD 06/25/23  evening, then 4/11 evening.  Refuses to do HD back to back. She has no stable home situation and cannot make any effort to attend OP HD.  She has been discharged from OP dialysis due to this fact.  For now she seems to be a default hospital dialysis patient -  will need HD next on 4/14- will plan for here   Dr. Ansel Kingdom has made it clear to her that in order to get further HD she will need to present to [facility] hospital unless her social situation changes 3 Hypertension: resume OP meds-  again social situation is complicating factor 4. Anemia of ESRD: Aranesp resumed 5. Metabolic Bone Disease: calcitriol 6.  Dispo:as above-  OP HD really does not seem to be an option due to unstable home and no effort on her part to partake in OP HD -  possible discharge today after HD. Patient is homeless.   Dujuan Stankowski H. Takao Lizer NP-C 06/30/2023, 9:02 AM  [employer] [phone]

## 2023-06-30 NOTE — Progress Notes (Signed)
 [name]  ZOX:[id number] DOB: 08/25/1960 DOA: 06/24/2023 PCP: [name], MD    Brief Narrative:  63 year old with a history of HTN, HLD, GERD, PUD, GI bleeding, hepatitis C, substance abuse, anxiety/depression, COPD, ESRD with noncompliance with HD MWF, and chronic pain who presented to the ER with shortness of breath following 2 recent similar admissions related to outpatient HD noncompliance and volume overload.  She again admitted to not having attended dialysis for at least a couple of weeks.  She continues to smoke.  At presentation her oxygen saturation was 86% on room air.  Goals of Care:   Code Status: Do not attempt resuscitation (DNR) PRE-ARREST INTERVENTIONS DESIRED   DVT prophylaxis: Place and maintain sequential compression device Start: 06/24/23 1653   Interim Hx: No acute events recorded overnight.  Afebrile.  Vital signs stable.  Upon returning from dialysis the patient is complaining of severe pain in her arm where her dialysis access is located.  She reports improved shortness of breath with some ongoing cough.  She denies chest pain.  Assessment & Plan:  Acute hypoxic respiratory failure due to fluid overload due to dialysis noncompliance/unavailability Improved significantly after initial dialysis treatment but unfortunately she refused her second dialysis treatment -did comply with subsequent dialysis treatments with significant clinical improvement afterwards - appears stable at this time with resolution of hypoxic failure and successful weaning to room air  Pain in right arm related to dialysis access I see no evidence of severe complication at the time of my exam postdialysis in her room -we will delay her discharge and monitor her arm overnight  Hyperkalemia due to dialysis noncompliance Corrected with dialysis  Hyponatremia Stable and asymptomatic  ESRD  Does not appear that she is on chronic dialysis due to her homelessness - nephrology knows  the patient well and continues to work to assist her -TOC and renal navigator worked together to help as best they could, but at this point it appears that outpatient dialysis is not going to be option for the patient -she has been instructed by the renal team to return to the ER at [facility] anytime she needs dialysis  Anemia of chronic kidney disease No evidence of active bleeding at present -hemoglobin stable  HTN Should improve with consistent dialysis, but unfortunately patient has been refusing consistent dialysis -adjust medical therapy and follow  HLD Continue atorvastatin  Anxiety/depression Continue usual buspirone and trazodone  History of PUD, gastritis, and AVMs Continue PPI  Tobacco abuse Has been counseled on the absolute need to discontinue tobacco abuse  Family Communication: No family present at time of exam Disposition: disposition challenging given her homelessness and need for dialysis -plan for discharge 4/15 if arm remains stable overnight   Objective: Blood pressure (!) 144/72, pulse 73, temperature 98 F (36.7 C), resp. rate 15, height 5\' 7"  (1.702 m), weight 56.1 kg, SpO2 90%.  Intake/Output Summary (Last 24 hours) at 06/30/2023 1020 Last data filed at 06/29/2023 1200 Gross per 24 hour  Intake 240 ml  Output --  Net 240 ml    Filed Weights   06/24/23 1409 06/30/23 0826  Weight: 54.9 kg 56.1 kg    Examination: General: No acute respiratory distress Lungs: Clear to auscultation throughout without wheezing Cardiovascular: Regular rate and rhythm without murmur gallop or rub normal S1 and S2 Abdomen: NT/ND, soft, BS positive, no rebound Extremities: No significant edema bilateral lower extremities -2+ radial pulse right upper extremity with no discoloration of the hand or  arm and good thrill over HD access  CBC: Recent Labs  Lab 06/24/23 1410 06/24/23 1429 06/26/23 0505 06/28/23 0738 06/30/23 0730  WBC 10.7*   < > 8.3 11.3* 16.4*   NEUTROABS 8.9*  --   --   --   --   HGB 9.3*   < > 9.5* 10.9* 9.9*  HCT 29.9*   < > 28.5* 34.0* 30.2*  MCV 96.5   < > 91.9 92.6 94.4  PLT 445*   < > 412* 496* 481*   < > = values in this interval not displayed.   Basic Metabolic Panel: Recent Labs  Lab 06/28/23 0738 06/29/23 0846 06/30/23 0505  NA 134* 132* 130*  K 3.2* 3.4* 3.7  CL 92* 93* 93*  CO2 27 23 23   GLUCOSE 99 156* 110*  BUN 32* 54* 71*  CREATININE 3.27* 4.46* 5.13*  CALCIUM 8.4* 8.2* 8.2*  PHOS 3.9 4.1 4.5   GFR: Estimated Creatinine Clearance: 10.1 mL/min (A) (by C-G formula based on SCr of 5.13 mg/dL (H)).   Scheduled Meds:  amLODipine  10 mg Oral Daily   atorvastatin  10 mg Oral Daily   benzonatate  100 mg Oral TID   budeson-glycopyrrolate-formoterol  2 puff Inhalation BID   busPIRone  10 mg Oral TID   calcitRIOL  0.25 mcg Oral Daily   carvedilol  6.25 mg Oral BID WC   Chlorhexidine Gluconate Cloth  6 each Topical Daily   darbepoetin (ARANESP) injection - DIALYSIS  60 mcg Subcutaneous Q Wed-1800   ferrous sulfate  325 mg Oral Q breakfast   gabapentin  100 mg Oral TID   guaiFENesin  600 mg Oral BID   ipratropium-albuterol  3 mL Nebulization TID   mupirocin ointment  1 Application Nasal BID   nicotine  14 mg Transdermal Daily   pantoprazole  40 mg Oral BID   predniSONE  40 mg Oral BID WC   sertraline  25 mg Oral Daily   sodium bicarbonate  1,300 mg Oral BID   sodium chloride flush  3 mL Intravenous Q12H   torsemide  100 mg Oral Daily     LOS: 6 days   Abbe Abate, MD [facility] Office  [phone] Pager - Text Page per Amion  If 7PM-7AM, please contact night-coverage per Amion 06/30/2023, 10:20 AM

## 2023-06-30 NOTE — Procedures (Signed)
 Patient venous pressure continued to alarm, asked another nurse for assistance. While trying to adjust venous needle, it came out. Pressure applied. Attempted recirculation but unable to. Dialyzer clotted off, unable to return blood. New system sat up.

## 2023-06-30 NOTE — Plan of Care (Signed)
   Problem: Health Behavior/Discharge Planning: Goal: Ability to manage health-related needs will improve Outcome: Progressing   Problem: Clinical Measurements: Goal: Will remain free from infection Outcome: Progressing Goal: Diagnostic test results will improve Outcome: Progressing Goal: Respiratory complications will improve Outcome: Progressing   Problem: Activity: Goal: Risk for activity intolerance will decrease Outcome: Progressing

## 2023-07-01 ENCOUNTER — Other Ambulatory Visit (HOSPITAL_COMMUNITY): Payer: Self-pay

## 2023-07-01 LAB — RENAL FUNCTION PANEL
Albumin: 2.4 g/dL — ABNORMAL LOW (ref 3.5–5.0)
Anion gap: 12 (ref 5–15)
BUN: 47 mg/dL — ABNORMAL HIGH (ref 8–23)
CO2: 26 mmol/L (ref 22–32)
Calcium: 8.2 mg/dL — ABNORMAL LOW (ref 8.9–10.3)
Chloride: 95 mmol/L — ABNORMAL LOW (ref 98–111)
Creatinine, Ser: 3.98 mg/dL — ABNORMAL HIGH (ref 0.44–1.00)
GFR, Estimated: 12 mL/min — ABNORMAL LOW (ref >60)
Glucose, Bld: 93 mg/dL (ref 70–99)
Phosphorus: 4.9 mg/dL — ABNORMAL HIGH (ref 2.5–4.6)
Potassium: 3.9 mmol/L (ref 3.5–5.1)
Sodium: 133 mmol/L — ABNORMAL LOW (ref 135–145)

## 2023-07-01 LAB — CBC
HCT: 28.6 % — ABNORMAL LOW (ref 36.0–46.0)
Hemoglobin: 9.2 g/dL — ABNORMAL LOW (ref 12.0–15.0)
MCH: 30.6 pg (ref 26.0–34.0)
MCHC: 32.2 g/dL (ref 30.0–36.0)
MCV: 95 fL (ref 80.0–100.0)
Platelets: 300 10*3/uL (ref 150–400)
RBC: 3.01 MIL/uL — ABNORMAL LOW (ref 3.87–5.11)
RDW: 19.8 % — ABNORMAL HIGH (ref 11.5–15.5)
WBC: 9.5 10*3/uL (ref 4.0–10.5)
nRBC: 0.3 % — ABNORMAL HIGH (ref 0.0–0.2)

## 2023-07-01 MED ORDER — SERTRALINE HCL 25 MG PO TABS
25.0000 mg | ORAL_TABLET | Freq: Every day | ORAL | 0 refills | Status: DC
Start: 2023-07-02 — End: 2023-07-26
  Filled 2023-07-01: qty 30, 30d supply, fill #0

## 2023-07-01 MED ORDER — GUAIFENESIN ER 600 MG PO TB12
600.0000 mg | ORAL_TABLET | Freq: Two times a day (BID) | ORAL | 0 refills | Status: AC
  Filled 2023-07-01: qty 14, 7d supply, fill #0

## 2023-07-01 MED ORDER — BENZONATATE 100 MG PO CAPS
100.0000 mg | ORAL_CAPSULE | Freq: Three times a day (TID) | ORAL | 0 refills | Status: DC
  Filled 2023-07-01: qty 20, 7d supply, fill #0

## 2023-07-01 MED ORDER — GABAPENTIN 100 MG PO CAPS
100.0000 mg | ORAL_CAPSULE | Freq: Three times a day (TID) | ORAL | 0 refills | Status: DC
  Filled 2023-07-01: qty 90, 30d supply, fill #0

## 2023-07-01 MED ORDER — TORSEMIDE 100 MG PO TABS
100.0000 mg | ORAL_TABLET | Freq: Every day | ORAL | 0 refills | Status: DC
Start: 2023-07-01 — End: 2023-07-26
  Filled 2023-07-01: qty 30, 30d supply, fill #0

## 2023-07-01 MED ORDER — CARVEDILOL 6.25 MG PO TABS
6.2500 mg | ORAL_TABLET | Freq: Two times a day (BID) | ORAL | 0 refills | Status: DC
  Filled 2023-07-01: qty 60, 30d supply, fill #0

## 2023-07-01 MED ORDER — FERROUS SULFATE 325 (65 FE) MG PO TABS
325.0000 mg | ORAL_TABLET | Freq: Every day | ORAL | 0 refills | Status: DC
  Filled 2023-07-01: qty 30, 30d supply, fill #0

## 2023-07-01 MED ORDER — ALBUTEROL SULFATE HFA 108 (90 BASE) MCG/ACT IN AERS
2.0000 | INHALATION_SPRAY | Freq: Four times a day (QID) | RESPIRATORY_TRACT | 0 refills | Status: DC | PRN
Start: 2023-07-01 — End: 2023-07-26
  Filled 2023-07-01: qty 18, 25d supply, fill #0

## 2023-07-01 MED ORDER — OXYCODONE HCL 5 MG PO TABS
5.0000 mg | ORAL_TABLET | ORAL | 0 refills | Status: DC | PRN
  Filled 2023-07-01: qty 15, 3d supply, fill #0

## 2023-07-01 MED ORDER — SODIUM BICARBONATE 650 MG PO TABS
1300.0000 mg | ORAL_TABLET | Freq: Two times a day (BID) | ORAL | 0 refills | Status: DC
  Filled 2023-07-01: qty 120, 30d supply, fill #0

## 2023-07-01 MED ORDER — BUSPIRONE HCL 10 MG PO TABS
10.0000 mg | ORAL_TABLET | Freq: Three times a day (TID) | ORAL | 0 refills | Status: DC
  Filled 2023-07-01: qty 90, 30d supply, fill #0

## 2023-07-01 MED ORDER — PANTOPRAZOLE SODIUM 40 MG PO TBEC
40.0000 mg | DELAYED_RELEASE_TABLET | Freq: Two times a day (BID) | ORAL | 0 refills | Status: DC
  Filled 2023-07-01: qty 60, 30d supply, fill #0

## 2023-07-01 MED ORDER — AMLODIPINE BESYLATE 10 MG PO TABS
10.0000 mg | ORAL_TABLET | Freq: Every day | ORAL | 0 refills | Status: DC
  Filled 2023-07-01: qty 30, 30d supply, fill #0

## 2023-07-01 MED ORDER — CALCITRIOL 0.25 MCG PO CAPS
0.2500 ug | ORAL_CAPSULE | Freq: Every day | ORAL | 0 refills | Status: DC
  Filled 2023-07-01: qty 30, 30d supply, fill #0

## 2023-07-01 MED ORDER — BUDESON-GLYCOPYRROL-FORMOTEROL 160-9-4.8 MCG/ACT IN AERO
2.0000 | INHALATION_SPRAY | Freq: Two times a day (BID) | RESPIRATORY_TRACT | 0 refills | Status: DC
  Filled 2023-07-01: qty 10.7, 30d supply, fill #0

## 2023-07-01 MED ORDER — ATORVASTATIN CALCIUM 10 MG PO TABS
10.0000 mg | ORAL_TABLET | Freq: Every day | ORAL | 0 refills | Status: DC
Start: 2023-07-01 — End: 2023-07-26
  Filled 2023-07-01: qty 30, 30d supply, fill #0

## 2023-07-01 MED ORDER — ACETAMINOPHEN 325 MG PO TABS
650.0000 mg | ORAL_TABLET | Freq: Four times a day (QID) | ORAL | 0 refills | Status: DC | PRN
  Filled 2023-07-01: qty 30, 4d supply, fill #0

## 2023-07-01 MED ORDER — TRAZODONE HCL 50 MG PO TABS
50.0000 mg | ORAL_TABLET | Freq: Every evening | ORAL | 0 refills | Status: DC | PRN
  Filled 2023-07-01: qty 30, 30d supply, fill #0

## 2023-07-01 NOTE — Discharge Summary (Signed)
 DISCHARGE SUMMARY  [name]  MR#: [id number]  DOB:[birth year]  Date of Admission: 06/24/2023 Date of Discharge: 07/01/2023  Attending Physician:Cynthia Raspberry Silvestre Gunner, MD  Patient's GMW:[id number], [name], MD  Disposition: D/C   Follow-up Appts:  Follow-up Information     Cynthia Matar, MD Follow up in 5 day(s).   Specialty: Internal Medicine Contact information: [address] [place] [address] [phone]                 Discharge Diagnoses: Acute hypoxic respiratory failure due to fluid overload due to dialysis  Pain in right arm related to dialysis access Hyperkalemia due to dialysis noncompliance Hyponatremia ESRD  Anemia of chronic kidney disease HTN HLD Anxiety/depression History of PUD, gastritis, and AVMs Tobacco abuse  Initial presentation: 63 year old with a history of HTN, HLD, GERD, PUD, GI bleeding, hepatitis C, substance abuse, anxiety/depression, COPD, ESRD with noncompliance with HD MWF, and chronic pain who presented to the ER with shortness of breath following 2 recent similar admissions related to outpatient HD noncompliance and volume overload. She again admitted to not having attended dialysis for at least a couple of weeks. She continues to smoke. At presentation her oxygen saturation was 86% on room air.   Hospital Course:  Acute hypoxic respiratory failure due to fluid overload due to dialysis noncompliance/unavailability Improved significantly after initial dialysis treatment but unfortunately she refused her second dialysis treatment -did comply with subsequent dialysis treatments with significant clinical improvement afterwards - appears stable at time of d/c with resolution of hypoxic failure and successful weaning to room air   Pain in right arm related to dialysis access There was no evidence of severe complication at the time of exam postdialysis, or the following morning at time of d/c    Hyperkalemia due  to dialysis noncompliance Corrected with dialysis   Hyponatremia Stable and asymptomatic   ESRD  Does not appear that she is on chronic dialysis due to her homelessness - nephrology knows the patient well and continues to work to assist her -TOC and renal navigator worked together to help as best they could, but at this point it appears that outpatient dialysis is not going to be option for the patient -she has been instructed by the renal team to return to the ER at [facility] anytime she needs dialysis   Anemia of chronic kidney disease No evidence of active bleeding at present -hemoglobin stable   HTN Improved with serial dialysis treatment   HLD Continue atorvastatin   Anxiety/depression Continue usual buspirone and trazodone   History of PUD, gastritis, and AVMs Continue PPI   Tobacco abuse Has been counseled on the absolute need to discontinue tobacco abuse  Allergies as of 07/01/2023       Reactions   Zestril [lisinopril] Other (See Comments)   Hyperkalemia        Medication List     STOP taking these medications    hydrOXYzine 25 MG tablet Commonly known as: ATARAX   ipratropium-albuterol 0.5-2.5 (3) MG/3ML Soln Commonly known as: DUONEB   Trelegy Ellipta 100-62.5-25 MCG/ACT Aepb Generic drug: Fluticasone-Umeclidin-Vilant Replaced by: budeson-glycopyrrolate-formoterol 160-9-4.8 MCG/ACT Aero inhaler       TAKE these medications    acetaminophen 325 MG tablet Commonly known as: TYLENOL Take 2 tablets (650 mg total) by mouth every 6 (six) hours as needed for mild pain (pain score 1-3) (or Fever >/= 101).   albuterol 108 (90 Base) MCG/ACT inhaler Commonly known as: VENTOLIN  HFA Inhale 2 puffs into the lungs every 6 (six) hours as needed for wheezing or shortness of breath.   amLODipine 10 MG tablet Commonly known as: NORVASC Take 1 tablet (10 mg total) by mouth daily.   atorvastatin 10 MG tablet Commonly known as: LIPITOR Take 1 tablet (10 mg  total) by mouth daily.   benzonatate 100 MG capsule Commonly known as: TESSALON Take 1 capsule (100 mg total) by mouth 3 (three) times daily.   budeson-glycopyrrolate-formoterol 160-9-4.8 MCG/ACT Aero inhaler Commonly known as: BREZTRI Inhale 2 puffs into the lungs 2 (two) times daily. Replaces: Trelegy Ellipta 100-62.5-25 MCG/ACT Aepb   busPIRone 10 MG tablet Commonly known as: BUSPAR Take 1 tablet (10 mg total) by mouth 3 (three) times daily. What changed: when to take this   calcitRIOL 0.25 MCG capsule Commonly known as: ROCALTROL Take 1 capsule (0.25 mcg total) by mouth daily. Start taking on: July 02, 2023   carvedilol 6.25 MG tablet Commonly known as: COREG Take 1 tablet (6.25 mg total) by mouth 2 (two) times daily with a meal.   ferrous sulfate 325 (65 FE) MG tablet Take 1 tablet (325 mg total) by mouth daily with breakfast.   gabapentin 100 MG capsule Commonly known as: NEURONTIN Take 1 capsule (100 mg total) by mouth 3 (three) times daily.   guaiFENesin 600 MG 12 hr tablet Commonly known as: MUCINEX Take 1 tablet (600 mg total) by mouth 2 (two) times daily for 7 days.   oxyCODONE 5 MG immediate release tablet Commonly known as: Oxy IR/ROXICODONE Take 1 tablet (5 mg total) by mouth every 4 (four) hours as needed for severe pain (pain score 7-10).   pantoprazole 40 MG tablet Commonly known as: PROTONIX Take 1 tablet (40 mg total) by mouth 2 (two) times daily.   sertraline 25 MG tablet Commonly known as: ZOLOFT Take 1 tablet (25 mg total) by mouth daily. Start taking on: July 02, 2023   sodium bicarbonate 650 MG tablet Take 2 tablets (1,300 mg total) by mouth 2 (two) times daily.   torsemide 100 MG tablet Commonly known as: DEMADEX Take 1 tablet (100 mg total) by mouth daily.   traZODone 50 MG tablet Commonly known as: DESYREL Take 1 tablet (50 mg total) by mouth at bedtime as needed for sleep.        Day of Discharge BP (!) 147/61 (BP  Location: Left Arm)   Pulse 67   Temp 98 F (36.7 C) (Oral)   Resp 18   Ht 5\' 7"  (1.702 m)   Wt 56.1 kg Comment: bed  SpO2 95%   BMI 19.37 kg/m   Physical Exam: General: No acute respiratory distress Lungs: Clear to auscultation bilaterally without wheezes or crackles Cardiovascular: Regular rate and rhythm without murmur gallop or rub normal S1 and S2 Abdomen: Nontender, nondistended, soft, bowel sounds positive, no rebound, no ascites, no appreciable mass Extremities: No significant cyanosis, clubbing, or edema bilateral lower extremities  Basic Metabolic Panel: Recent Labs  Lab 06/27/23 0607 06/28/23 0738 06/29/23 0846 06/30/23 0505 07/01/23 0510  NA 135 134* 132* 130* 133*  K 4.6 3.2* 3.4* 3.7 3.9  CL 100 92* 93* 93* 95*  CO2 22 27 23 23 26   GLUCOSE 151* 99 156* 110* 93  BUN 64* 32* 54* 71* 47*  CREATININE 4.61* 3.27* 4.46* 5.13* 3.98*  CALCIUM 7.9* 8.4* 8.2* 8.2* 8.2*  PHOS 3.4 3.9 4.1 4.5 4.9*    CBC: Recent Labs  Lab 06/24/23 1410 06/24/23  1429 06/25/23 0503 06/26/23 0505 06/28/23 0738 06/30/23 0730 07/01/23 0510  WBC 10.7*  --  7.1 8.3 11.3* 16.4* 9.5  NEUTROABS 8.9*  --   --   --   --   --   --   HGB 9.3*   < > 8.7* 9.5* 10.9* 9.9* 9.2*  HCT 29.9*   < > 25.8* 28.5* 34.0* 30.2* 28.6*  MCV 96.5  --  91.5 91.9 92.6 94.4 95.0  PLT 445*  --  398 412* 496* 481* 300   < > = values in this interval not displayed.    Time spent in discharge (includes decision making & examination of pt): 35 minutes  07/01/2023, 11:52 AM   Abbe Abate, MD [facility] Office  [phone]

## 2023-07-01 NOTE — TOC Transition Note (Signed)
 Transition of Care [facility]) - Discharge Note   Patient Details  Name: [name] MRN: [id number] Date of Birth: [birth year]  Transition of Care [facility]) CM/SW Contact:  Tom-[name], Angelique Ken, RN Phone Number: 07/01/2023, 12:41 PM   Clinical Narrative:     Patient is scheduled for discharge today.  Readmission Risk Assessment done. Outpatient f/u, hospital f/u and discharge instructions on AVS. Prescriptions sent to [facility] pharmacy and patient will receive meds prior discharge. Bus pass given to patient for transportation at discharge. Patient states she will be staying with a friend at discharge.   No further TOC needs noted.        Final next level of care: Home/Self Care (Patient is homeless, states she is going to stay with a friend.) Barriers to Discharge: Barriers Resolved   Patient Goals and CMS Choice Patient states their goals for this hospitalization and ongoing recovery are:: To go to a friend's house CMS Medicare.gov Compare Post Acute Care list provided to:: Patient Choice offered to / list presented to : Patient      Discharge Placement                Patient to be transferred to facility by: [facility]      Discharge Plan and Services Additional resources added to the After Visit Summary for                  DME Arranged: N/A DME Agency: NA       HH Arranged: NA HH Agency: NA        Social Drivers of Health (SDOH) Interventions SDOH Screenings   Food Insecurity: Food Insecurity Present (06/24/2023)  Housing: High Risk (06/24/2023)  Transportation Needs: Unmet Transportation Needs (06/24/2023)  Utilities: At Risk (06/24/2023)  Alcohol Screen: Low Risk  (02/10/2023)  Depression (PHQ2-9): Medium Risk (06/11/2023)  Financial Resource Strain: High Risk (02/10/2023)  Physical Activity: Inactive (02/10/2023)  Social Connections: Socially Isolated (06/12/2023)  Tobacco Use: High Risk (06/24/2023)  Health Literacy: Adequate Health Literacy  (02/10/2023)     Readmission Risk Interventions    07/01/2023   12:39 PM 06/16/2023    4:04 PM 01/28/2023   10:32 AM  Readmission Risk Prevention Plan  Transportation Screening Complete Complete Complete  Medication Review (RN Care Manager) Referral to Pharmacy Complete Complete  PCP or Specialist appointment within 3-5 days of discharge Complete Complete   HRI or Home Care Consult Complete Complete   SW Recovery Care/Counseling Consult Complete Complete   Palliative Care Screening Not Applicable Not Applicable   Skilled Nursing Facility Not Applicable Not Applicable

## 2023-07-01 NOTE — Progress Notes (Addendum)
 [facility] Progress Note   Subjective: HD yesterday. Issues cannulating AVF (staff). Now painful after infiltration, but improved when pressure dressing removed.    Objective Vitals:   06/30/23 1449 06/30/23 1500 06/30/23 1954 07/01/23 0430  BP:  (!) 163/69 (!) 140/67 (!) 147/61  Pulse: 69 66 69 67  Resp: 16  20 18   Temp:   98.2 F (36.8 C) 98 F (36.7 C)  TempSrc:   Oral Oral  SpO2: 94%  95% 95%  Weight:      Height:       Physical Exam General: Chronically ill appearing female in NAD Heart: S1,S2 RRR Lungs: Coarse upper airway sounds, decreased in bases with few crackles Abdomen: NABS, NT Extremities:no LE edema Dialysis Access: AVF with swelling, erythema upper AVF post infiltration. + T/B. Hematoma noted along side of AVF R side.   Additional Objective Labs: Basic Metabolic Panel: Recent Labs  Lab 06/29/23 0846 06/30/23 0505 07/01/23 0510  NA 132* 130* 133*  K 3.4* 3.7 3.9  CL 93* 93* 95*  CO2 23 23 26   GLUCOSE 156* 110* 93  BUN 54* 71* 47*  CREATININE 4.46* 5.13* 3.98*  CALCIUM 8.2* 8.2* 8.2*  PHOS 4.1 4.5 4.9*   Liver Function Tests: Recent Labs  Lab 06/24/23 1410 06/25/23 0503 06/29/23 0846 06/30/23 0505 07/01/23 0510  AST 23  --   --   --   --   ALT 14  --   --   --   --   ALKPHOS 106  --   --   --   --   BILITOT 0.5  --   --   --   --   PROT 7.2  --   --   --   --   ALBUMIN 2.9*   < > 2.6* 2.5* 2.4*   < > = values in this interval not displayed.   No results for input(s): "LIPASE", "AMYLASE" in the last 168 hours. CBC: Recent Labs  Lab 06/24/23 1410 06/24/23 1429 06/25/23 0503 06/26/23 0505 06/28/23 0738 06/30/23 0730 07/01/23 0510  WBC 10.7*  --  7.1 8.3 11.3* 16.4* 9.5  NEUTROABS 8.9*  --   --   --   --   --   --   HGB 9.3*   < > 8.7* 9.5* 10.9* 9.9* 9.2*  HCT 29.9*   < > 25.8* 28.5* 34.0* 30.2* 28.6*  MCV 96.5  --  91.5 91.9 92.6 94.4 95.0  PLT 445*  --  398 412* 496* 481* 300   < > = values in this interval  not displayed.   Blood Culture    Component Value Date/Time   SDES BLOOD LEFT UPPER ARM 06/24/2023 1641   SDES BLOOD SITE NOT SPECIFIED 06/24/2023 1641   SPECREQUEST  06/24/2023 1641    BOTTLES DRAWN AEROBIC AND ANAEROBIC Blood Culture adequate volume   SPECREQUEST  06/24/2023 1641    BOTTLES DRAWN AEROBIC AND ANAEROBIC Blood Culture results may not be optimal due to an inadequate volume of blood received in culture bottles   CULT  06/24/2023 1641    NO GROWTH 5 DAYS Performed at [facility], [address], [place], [place] [address]    CULT  06/24/2023 1641    NO GROWTH 5 DAYS Performed at [facility], [address] [place], [place] [address]    REPTSTATUS 06/29/2023 FINAL 06/24/2023 1641   REPTSTATUS 06/29/2023 FINAL 06/24/2023 1641    Cardiac Enzymes: No results  for input(s): "CKTOTAL", "CKMB", "CKMBINDEX", "TROPONINI" in the last 168 hours. CBG: No results for input(s): "GLUCAP" in the last 168 hours. Iron Studies: No results for input(s): "IRON", "TIBC", "TRANSFERRIN", "FERRITIN" in the last 72 hours. @lablastinr3 @ Studies/Results: No results found. Medications:   amLODipine  10 mg Oral Daily   atorvastatin  10 mg Oral Daily   benzonatate  100 mg Oral TID   budeson-glycopyrrolate-formoterol  2 puff Inhalation BID   busPIRone  10 mg Oral TID   calcitRIOL  0.25 mcg Oral Daily   carvedilol  6.25 mg Oral BID WC   Chlorhexidine Gluconate Cloth  6 each Topical Daily   darbepoetin (ARANESP) injection - DIALYSIS  60 mcg Subcutaneous Q Wed-1800   ferrous sulfate  325 mg Oral Q breakfast   gabapentin  100 mg Oral TID   guaiFENesin  600 mg Oral BID   mupirocin ointment  1 Application Nasal BID   nicotine  14 mg Transdermal Daily   pantoprazole  40 mg Oral BID   predniSONE  40 mg Oral Q breakfast   sertraline  25 mg Oral Daily   sodium bicarbonate  1,300 mg Oral BID   sodium chloride flush  3 mL Intravenous Q12H   torsemide  100 mg Oral Daily      Assessment/Plan: 1 Acute hypoxic RF:  looks like pulm edema, on CAP coverage as well-   improved with abx and HD 2 ESRD:  no regular dialysis.  HD 06/25/23 evening, then 4/11 evening.  Refuses to do HD back to back. She has no stable home situation and cannot make any effort to attend OP HD.  She has been discharged from OP dialysis due to this fact.  For now she seems to be a default hospital dialysis patient - HD next on 07/02/2023- will plan for here   Dr. Ansel Kingdom has made it clear to her that in order to get further HD she will need to present to [facility] hospital unless her social situation changes 3 Hypertension: resume OP meds-  again social situation is complicating factor 4. Anemia of ESRD: Aranesp resumed 5. Metabolic Bone Disease: calcitriol 6.  Dispo:as above-  OP HD really does not seem to be an option due to unstable home and no effort on her part to partake in OP HD -  possible discharge today after HD. Patient is homeless. We have been discussing possibility of going to drug rehab. She says she doesn't think about using drugs when she is off the street.  7. AVF infiltration. Will elevate and ice AVF today. Monitor  [employer]. Tenae Graziosi NP-C 07/01/2023, 11:09 AM  [employer] [phone]

## 2023-07-02 ENCOUNTER — Telehealth: Payer: Self-pay | Admitting: *Deleted

## 2023-07-02 NOTE — Transitions of Care (Post Inpatient/ED Visit) (Signed)
   07/02/2023  Name: [name] MRN: [id number] DOB: 12-21-1960  Today's TOC FU Call Status: Today's TOC FU Call Status:: Unsuccessful Call (2nd Attempt) Unsuccessful Call (2nd Attempt) Date: 07/02/23 Patient's Name and Date of Birth confirmed.  Transition Care Management Follow-up Telephone Call Date of Discharge: 07/01/23 Discharge Facility: [facility]) Type of Discharge: Inpatient Admission Primary Inpatient Discharge Diagnosis:: Acute hypoxic resp failure d/t fluid overload How have you been since you were released from the hospital?: Better (Patient reports that she is fine, she has all of her medications, declines medication review, stating, "I have everything I need, I am ok" and ended the call)  Items Reviewed:Patient declined    Medications Reviewed Today:Patient declined Medications Reviewed Today   Medications were not reviewed in this encounter     Home Care and Equipment/Supplies:Patient declined    Functional Questionnaire:Patient declined    Follow up appointments reviewed:Unable to review future appointments, call was ended by patient      Arna Better RN, BSN [place]  [facility] Health RN Care Manager [phone]

## 2023-07-02 NOTE — Transitions of Care (Post Inpatient/ED Visit) (Signed)
   07/02/2023  Name: [name] MRN: [id number] DOB: October 11, 1960  Today's TOC FU Call Status: Today's TOC FU Call Status:: Unsuccessful Call (1st Attempt) Unsuccessful Call (1st Attempt) Date: 07/02/23  Attempted to reach the patient regarding the most recent Inpatient/ED visit. [name] answered the call, briefly spoke with RNCM. She reported being "just fine", declined medication review, agreed for [facility] to contact her later today.  Follow Up Plan: Additional outreach attempts will be made to reach the patient to complete the Transitions of Care (Post Inpatient/ED visit) call.   Arna Better RN, BSN [place]  [facility] Health RN Care Manager [phone]

## 2023-07-09 ENCOUNTER — Inpatient Hospital Stay: Admitting: Physician Assistant

## 2023-07-09 NOTE — Progress Notes (Deleted)
 Patient ID: [name], female   DOB: 12-14-1960, 63 y.o.   MRN: [id number]  After hospitalization 4/8 to 4/15: Discharge Diagnoses: Acute hypoxic respiratory failure due to fluid overload due to dialysis  Pain in right arm related to dialysis access Hyperkalemia due to dialysis noncompliance Hyponatremia ESRD  Anemia of chronic kidney disease HTN HLD Anxiety/depression History of PUD, gastritis, and AVMs Tobacco abuse   Initial presentation: 63 year old with a history of HTN, HLD, GERD, PUD, GI bleeding, hepatitis C, substance abuse, anxiety/depression, COPD, ESRD with noncompliance with HD MWF, and chronic pain who presented to the ER with shortness of breath following 2 recent similar admissions related to outpatient HD noncompliance and volume overload. She again admitted to not having attended dialysis for at least a couple of weeks. She continues to smoke. At presentation her oxygen saturation was 86% on room air.    Hospital Course:   Acute hypoxic respiratory failure due to fluid overload due to dialysis noncompliance/unavailability Improved significantly after initial dialysis treatment but unfortunately she refused her second dialysis treatment -did comply with subsequent dialysis treatments with significant clinical improvement afterwards - appears stable at time of d/c with resolution of hypoxic failure and successful weaning to room air   Pain in right arm related to dialysis access There was no evidence of severe complication at the time of exam postdialysis, or the following morning at time of d/c    Hyperkalemia due to dialysis noncompliance Corrected with dialysis   Hyponatremia Stable and asymptomatic   ESRD  Does not appear that she is on chronic dialysis due to her homelessness - nephrology knows the patient well and continues to work to assist her -TOC and renal navigator worked together to help as best they could, but at this point it appears that outpatient  dialysis is not going to be option for the patient -she has been instructed by the renal team to return to the ER at [facility] anytime she needs dialysis   Anemia of chronic kidney disease No evidence of active bleeding at present -hemoglobin stable   HTN Improved with serial dialysis treatment   HLD Continue atorvastatin    Anxiety/depression Continue usual buspirone  and trazodone    History of PUD, gastritis, and AVMs Continue PPI   Tobacco abuse Has been counseled on the absolute need to discontinue tobacco abuse

## 2023-07-15 ENCOUNTER — Ambulatory Visit: Payer: Medicaid Other | Admitting: Gastroenterology

## 2023-07-15 NOTE — Progress Notes (Deleted)
 [facility] Progress Note  Chief Complaint: ***  Subjective  Prior history  Known to Dr. Dominic Friendly from history of colon polyps in 2022 COPD, diastolic heart failure, end-stage renal disease progressing to hemodialysis in 2024.  Repeated hospitalizations for noncompliance with dialysis and volume overload with respiratory failure  Discharge summaries indicate patient's homelessness contributing to inconsistent care. 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 No show [facility] visit 11/29/2022 and 06/25/23   Discussed the use of AI scribe software for clinical note transcription with the patient, who gave verbal consent to proceed.  History of Present Illness     ROS: Cardiovascular:  no chest pain Respiratory: no dyspnea  The patient's Past Medical, Family and Social History were reviewed and are on file in the EMR. Past Medical History:  Diagnosis Date   Acute on chronic blood loss anemia 08/01/2022   Acute on chronic diastolic CHF (congestive heart failure) (HCC) 08/12/2022   Acute on chronic respiratory failure with hypoxia (HCC) 05/16/2023   Acute pulmonary edema (HCC) 05/16/2023   Acute seasonal allergic rhinitis due to pollen 02/22/2016   Alcohol  abuse    Allergy    Anxiety    Arteriovenous fistula infection, initial encounter (HCC) 04/16/2023   Arthritis    Asthma    Cannabis abuse    Cocaine abuse (HCC)    COPD (chronic obstructive pulmonary disease) (HCC)    COPD with acute exacerbation (HCC) 10/25/2022   COVID 05/16/2023   Depression    Drug addiction (HCC)    GERD (gastroesophageal reflux disease)    GIB (gastrointestinal bleeding) 08/19/2019   Heart murmur    Heme positive stool 08/14/2022   Homelessness    Hx of adenomatous colonic polyps    Hyperlipidemia    Hypertension    pt stated "every once in a while BP will be high but has not been prescribed  medication for HTN.    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) 02/22/2016   SIRS (systemic inflammatory response syndrome) (HCC) 10/25/2022    Past Surgical History:  Procedure Laterality Date   A/V FISTULAGRAM Right 01/31/2023   Procedure: A/V Fistulagram;  Surgeon: Young Hensen, MD;  Location: [facility];  Service: Cardiovascular;  Laterality: Right;   AV FISTULA PLACEMENT Right 08/14/2022   Procedure: RIGHT ARM BRACHIOBASILIC ATERIOVENOUS FISTULA CREATION;  Surgeon: Carlene Che, MD;  Location: [facility] OR;  Service: Vascular;  Laterality: Right;   BASCILIC VEIN TRANSPOSITION Right 11/13/2022   Procedure: RIGHT ARM SECOND STAGE BASILIC VEIN TRANSPOSITION;  Surgeon: Carlene Che, MD;  Location: [facility] OR;  Service: Vascular;  Laterality: Right;   BIOPSY  01/01/2019   Procedure: BIOPSY;  Surgeon: Nannette Babe, MD;  Location: [facility] ENDOSCOPY;  Service: Endoscopy;;   BIOPSY  11/17/2021   Procedure: BIOPSY;  Surgeon: Lajuan Pila, MD;  Location: [facility] ENDOSCOPY;  Service: Gastroenterology;;   CESAREAN SECTION  1989   COLONOSCOPY  11/07/2020   2018   CYSTOSCOPY W/ URETERAL STENT PLACEMENT Left 08/01/2018   Procedure: CYSTOSCOPY WITH RETROGRADE PYELOGRAM/URETERAL STENT PLACEMENT;  Surgeon: Samson Croak, MD;  Location: [facility] ORS;  Service:  Urology;  Laterality: Left;   CYSTOSCOPY WITH RETROGRADE PYELOGRAM, URETEROSCOPY AND STENT PLACEMENT Left 01/15/2019   Procedure: CYSTOSCOPY WITH RETROGRADE PYELOGRAM, URETEROSCOPY AND STENT PLACEMENT;  Surgeon: Samson Croak, MD;  Location: [facility] ORS;  Service: Urology;  Laterality: Left;   ENTEROSCOPY N/A 08/16/2022   Procedure: ENTEROSCOPY;  Surgeon: Brice Campi Albino Alu., MD;   Location: [facility] ENDOSCOPY;  Service: Gastroenterology;  Laterality: N/A;   ENTEROSCOPY N/A 10/31/2022   Procedure: ENTEROSCOPY;  Surgeon: Nannette Babe, MD;  Location: [facility] ENDOSCOPY;  Service: Gastroenterology;  Laterality: N/A;   ENTEROSCOPY N/A 05/09/2023   Procedure: ENTEROSCOPY;  Surgeon: Daina Drum, MD;  Location: [facility] ENDOSCOPY;  Service: Gastroenterology;  Laterality: N/A;   ESOPHAGOGASTRODUODENOSCOPY (EGD) WITH PROPOFOL  N/A 01/01/2019   Procedure: ESOPHAGOGASTRODUODENOSCOPY (EGD) WITH PROPOFOL ;  Surgeon: Nannette Babe, MD;  Location: [facility] ENDOSCOPY;  Service: Endoscopy;  Laterality: N/A;   ESOPHAGOGASTRODUODENOSCOPY (EGD) WITH PROPOFOL  N/A 08/21/2019   Procedure: ESOPHAGOGASTRODUODENOSCOPY (EGD) WITH PROPOFOL ;  Surgeon: Sergio Dandy, MD;  Location: [facility] ENDOSCOPY;  Service: Endoscopy;  Laterality: N/A;   ESOPHAGOGASTRODUODENOSCOPY (EGD) WITH PROPOFOL  N/A 11/17/2021   Procedure: ESOPHAGOGASTRODUODENOSCOPY (EGD) WITH PROPOFOL ;  Surgeon: Lajuan Pila, MD;  Location: [facility] ENDOSCOPY;  Service: Gastroenterology;  Laterality: N/A;   FRACTURE SURGERY Left 2011   arm   HEMOSTASIS CLIP PLACEMENT  08/16/2022   Procedure: HEMOSTASIS CLIP PLACEMENT;  Surgeon: Normie Becton., MD;  Location: [facility] ENDOSCOPY;  Service: Gastroenterology;;   HOT HEMOSTASIS N/A 08/16/2022   Procedure: HOT HEMOSTASIS (ARGON PLASMA COAGULATION/BICAP);  Surgeon: Normie Becton., MD;  Location: [facility] ENDOSCOPY;  Service: Gastroenterology;  Laterality: N/A;   HOT HEMOSTASIS N/A 10/31/2022   Procedure: HOT HEMOSTASIS (ARGON PLASMA COAGULATION/BICAP);  Surgeon: Nannette Babe, MD;  Location: [facility] ENDOSCOPY;  Service: Gastroenterology;  Laterality: N/A;   HOT HEMOSTASIS N/A 05/09/2023   Procedure: HOT HEMOSTASIS (ARGON PLASMA COAGULATION/BICAP);  Surgeon: Daina Drum, MD;  Location: [facility] ENDOSCOPY;  Service: Gastroenterology;  Laterality: N/A;   INSERTION OF DIALYSIS CATHETER Right 08/14/2022   Procedure: INSERTION OF TUNNELED  DIALYSIS CATHETER USING PALINDROME CATHETER KIT 19CM;  Surgeon: Carlene Che, MD;  Location: [facility] OR;  Service: Vascular;  Laterality: Right;   POLYPECTOMY  08/16/2022   Procedure: POLYPECTOMY;  Surgeon: Brice Campi Albino Alu., MD;  Location: [facility] ENDOSCOPY;  Service: Gastroenterology;;   SUBMUCOSAL TATTOO INJECTION  08/16/2022   Procedure: SUBMUCOSAL TATTOO INJECTION;  Surgeon: Normie Becton., MD;  Location: [facility] ENDOSCOPY;  Service: Gastroenterology;;   UPPER GASTROINTESTINAL ENDOSCOPY       Objective:  Med list reviewed  Current Outpatient Medications:    acetaminophen  (TYLENOL ) 325 MG tablet, Take 2 tablets (650 mg total) by mouth every 6 (six) hours as needed for mild pain (pain score 1-3) (or Fever >/= 101)., Disp: 30 tablet, Rfl: 0   albuterol  (VENTOLIN  HFA) 108 (90 Base) MCG/ACT inhaler, Inhale 2 puffs into the lungs every 6 (six) hours as needed for wheezing or shortness of breath., Disp: 18 g, Rfl: 0   amLODipine  (NORVASC ) 10 MG tablet, Take 1 tablet (10 mg total) by mouth daily., Disp: 30 tablet, Rfl: 0   atorvastatin  (LIPITOR) 10 MG tablet, Take 1 tablet (10 mg total) by mouth daily., Disp: 30 tablet, Rfl: 0   benzonatate  (TESSALON ) 100 MG capsule, Take 1 capsule (100 mg total) by mouth 3 (three) times daily., Disp: 20 capsule, Rfl: 0   budeson-glycopyrrolate -formoterol  (BREZTRI ) 160-9-4.8 MCG/ACT AERO inhaler, Inhale 2 puffs into the lungs 2 (two) times daily., Disp: 10.7 g,  Rfl: 0   busPIRone  (BUSPAR ) 10 MG tablet, Take 1 tablet (10 mg total) by mouth 3 (three) times daily., Disp: 90 tablet, Rfl: 0   calcitRIOL  (ROCALTROL ) 0.25 MCG capsule, Take 1 capsule (0.25 mcg total) by mouth daily., Disp: 30 capsule, Rfl: 0   carvedilol  (COREG ) 6.25 MG tablet, Take 1 tablet (6.25 mg total) by mouth 2 (two) times daily with a meal., Disp: 60 tablet, Rfl: 0   ferrous sulfate  325 (65 FE) MG tablet, Take 1 tablet (325 mg total) by mouth daily with breakfast., Disp: 30 tablet, Rfl: 0    gabapentin  (NEURONTIN ) 100 MG capsule, Take 1 capsule (100 mg total) by mouth 3 (three) times daily., Disp: 90 capsule, Rfl: 0   oxyCODONE  (OXY IR/ROXICODONE ) 5 MG immediate release tablet, Take 1 tablet (5 mg total) by mouth every 4 (four) hours as needed for severe pain (pain score 7-10)., Disp: 15 tablet, Rfl: 0   pantoprazole  (PROTONIX ) 40 MG tablet, Take 1 tablet (40 mg total) by mouth 2 (two) times daily., Disp: 60 tablet, Rfl: 0   sertraline  (ZOLOFT ) 25 MG tablet, Take 1 tablet (25 mg total) by mouth daily., Disp: 30 tablet, Rfl: 0   sodium bicarbonate  650 MG tablet, Take 2 tablets (1,300 mg total) by mouth 2 (two) times daily., Disp: 120 tablet, Rfl: 0   torsemide  (DEMADEX ) 100 MG tablet, Take 1 tablet (100 mg total) by mouth daily., Disp: 30 tablet, Rfl: 0   traZODone  (DESYREL ) 50 MG tablet, Take 1 tablet (50 mg total) by mouth at bedtime as needed for sleep., Disp: 30 tablet, Rfl: 0   Vital signs in last 24 hrs: There were no vitals filed for this visit. Wt Readings from Last 3 Encounters:  06/30/23 123 lb 10.9 oz (56.1 kg)  06/16/23 121 lb 0.5 oz (54.9 kg)  06/11/23 119 lb (54 kg)    Physical Exam  *** HEENT: sclera anicteric, oral mucosa moist without lesions Neck: supple, no thyromegaly, JVD or lymphadenopathy Cardiac: ***,  no peripheral edema Pulm: clear to auscultation bilaterally, normal RR and effort noted Abdomen: soft, *** tenderness, with active bowel sounds. No guarding or palpable hepatosplenomegaly. Skin; warm and dry, no jaundice or rash   Labs:   ___________________________________________ Radiologic studies:   ____________________________________________ Other:   _____________________________________________   No diagnosis found.  Assessment and Plan Assessment & Plan      Plan:   *** minutes were spent on this encounter (including chart review, history/exam, counseling/coordination of care, and documentation) > 50% of that time was  spent on counseling and coordination of care.   Kerby Pearson III

## 2023-07-23 ENCOUNTER — Emergency Department (HOSPITAL_COMMUNITY): Payer: Self-pay

## 2023-07-23 ENCOUNTER — Inpatient Hospital Stay (HOSPITAL_COMMUNITY)
Admission: EM | Admit: 2023-07-23 | Discharge: 2023-07-26 | DRG: 391 | Disposition: A | Discharge status: Home / Self Care | Payer: Self-pay | Attending: Internal Medicine | Admitting: Internal Medicine

## 2023-07-23 ENCOUNTER — Other Ambulatory Visit: Payer: Self-pay

## 2023-07-23 DIAGNOSIS — Z5900 Homelessness unspecified: Secondary | ICD-10-CM

## 2023-07-23 DIAGNOSIS — F32A Depression, unspecified: Secondary | ICD-10-CM | POA: Diagnosis present

## 2023-07-23 DIAGNOSIS — D631 Anemia in chronic kidney disease: Secondary | ICD-10-CM | POA: Diagnosis present

## 2023-07-23 DIAGNOSIS — Z91158 Patient's noncompliance with renal dialysis for other reason: Secondary | ICD-10-CM

## 2023-07-23 DIAGNOSIS — F17210 Nicotine dependence, cigarettes, uncomplicated: Secondary | ICD-10-CM | POA: Diagnosis present

## 2023-07-23 DIAGNOSIS — K5900 Constipation, unspecified: Secondary | ICD-10-CM | POA: Diagnosis present

## 2023-07-23 DIAGNOSIS — Z833 Family history of diabetes mellitus: Secondary | ICD-10-CM

## 2023-07-23 DIAGNOSIS — F1994 Other psychoactive substance use, unspecified with psychoactive substance-induced mood disorder: Secondary | ICD-10-CM

## 2023-07-23 DIAGNOSIS — E785 Hyperlipidemia, unspecified: Secondary | ICD-10-CM | POA: Diagnosis present

## 2023-07-23 DIAGNOSIS — Z79899 Other long term (current) drug therapy: Secondary | ICD-10-CM

## 2023-07-23 DIAGNOSIS — I10 Essential (primary) hypertension: Secondary | ICD-10-CM | POA: Diagnosis present

## 2023-07-23 DIAGNOSIS — D500 Iron deficiency anemia secondary to blood loss (chronic): Secondary | ICD-10-CM | POA: Diagnosis present

## 2023-07-23 DIAGNOSIS — K31819 Angiodysplasia of stomach and duodenum without bleeding: Secondary | ICD-10-CM | POA: Diagnosis present

## 2023-07-23 DIAGNOSIS — K2980 Duodenitis without bleeding: Secondary | ICD-10-CM | POA: Diagnosis not present

## 2023-07-23 DIAGNOSIS — D638 Anemia in other chronic diseases classified elsewhere: Secondary | ICD-10-CM | POA: Diagnosis present

## 2023-07-23 DIAGNOSIS — E1122 Type 2 diabetes mellitus with diabetic chronic kidney disease: Secondary | ICD-10-CM | POA: Diagnosis present

## 2023-07-23 DIAGNOSIS — J4489 Other specified chronic obstructive pulmonary disease: Secondary | ICD-10-CM | POA: Diagnosis present

## 2023-07-23 DIAGNOSIS — Z992 Dependence on renal dialysis: Secondary | ICD-10-CM

## 2023-07-23 DIAGNOSIS — G8929 Other chronic pain: Secondary | ICD-10-CM

## 2023-07-23 DIAGNOSIS — I120 Hypertensive chronic kidney disease with stage 5 chronic kidney disease or end stage renal disease: Secondary | ICD-10-CM | POA: Diagnosis present

## 2023-07-23 DIAGNOSIS — I160 Hypertensive urgency: Secondary | ICD-10-CM | POA: Diagnosis present

## 2023-07-23 DIAGNOSIS — F1910 Other psychoactive substance abuse, uncomplicated: Secondary | ICD-10-CM | POA: Diagnosis present

## 2023-07-23 DIAGNOSIS — E871 Hypo-osmolality and hyponatremia: Secondary | ICD-10-CM | POA: Diagnosis present

## 2023-07-23 DIAGNOSIS — F419 Anxiety disorder, unspecified: Secondary | ICD-10-CM | POA: Diagnosis present

## 2023-07-23 DIAGNOSIS — J449 Chronic obstructive pulmonary disease, unspecified: Secondary | ICD-10-CM | POA: Diagnosis present

## 2023-07-23 DIAGNOSIS — Z8616 Personal history of COVID-19: Secondary | ICD-10-CM

## 2023-07-23 DIAGNOSIS — Z7951 Long term (current) use of inhaled steroids: Secondary | ICD-10-CM

## 2023-07-23 DIAGNOSIS — N186 End stage renal disease: Secondary | ICD-10-CM | POA: Diagnosis present

## 2023-07-23 DIAGNOSIS — Z66 Do not resuscitate: Secondary | ICD-10-CM | POA: Diagnosis present

## 2023-07-23 DIAGNOSIS — E872 Acidosis, unspecified: Secondary | ICD-10-CM | POA: Diagnosis present

## 2023-07-23 LAB — I-STAT VENOUS BLOOD GAS, ED
Acid-base deficit: 11 mmol/L — ABNORMAL HIGH (ref 0.0–2.0)
Bicarbonate: 11.1 mmol/L — ABNORMAL LOW (ref 20.0–28.0)
Calcium, Ion: 0.85 mmol/L — CL (ref 1.15–1.40)
HCT: 32 % — ABNORMAL LOW (ref 36.0–46.0)
Hemoglobin: 10.9 g/dL — ABNORMAL LOW (ref 12.0–15.0)
O2 Saturation: 100 %
Potassium: 4.1 mmol/L (ref 3.5–5.1)
Sodium: 132 mmol/L — ABNORMAL LOW (ref 135–145)
TCO2: 12 mmol/L — ABNORMAL LOW (ref 22–32)
pCO2, Ven: 16 mmHg — CL (ref 44–60)
pH, Ven: 7.452 — ABNORMAL HIGH (ref 7.25–7.43)
pO2, Ven: 160 mmHg — ABNORMAL HIGH (ref 32–45)

## 2023-07-23 LAB — COMPREHENSIVE METABOLIC PANEL WITH GFR
ALT: 19 U/L (ref 0–44)
AST: 18 U/L (ref 15–41)
Albumin: 3.2 g/dL — ABNORMAL LOW (ref 3.5–5.0)
Alkaline Phosphatase: 133 U/L — ABNORMAL HIGH (ref 38–126)
Anion gap: 18 — ABNORMAL HIGH (ref 5–15)
BUN: 72 mg/dL — ABNORMAL HIGH (ref 8–23)
CO2: 14 mmol/L — ABNORMAL LOW (ref 22–32)
Calcium: 8.8 mg/dL — ABNORMAL LOW (ref 8.9–10.3)
Chloride: 102 mmol/L (ref 98–111)
Creatinine, Ser: 4.97 mg/dL — ABNORMAL HIGH (ref 0.44–1.00)
GFR, Estimated: 9 mL/min — ABNORMAL LOW
Glucose, Bld: 102 mg/dL — ABNORMAL HIGH (ref 70–99)
Potassium: 4.1 mmol/L (ref 3.5–5.1)
Sodium: 134 mmol/L — ABNORMAL LOW (ref 135–145)
Total Bilirubin: 0.5 mg/dL (ref 0.0–1.2)
Total Protein: 7 g/dL (ref 6.5–8.1)

## 2023-07-23 LAB — CBC
HCT: 35.6 % — ABNORMAL LOW (ref 36.0–46.0)
Hemoglobin: 11.5 g/dL — ABNORMAL LOW (ref 12.0–15.0)
MCH: 31.9 pg (ref 26.0–34.0)
MCHC: 32.3 g/dL (ref 30.0–36.0)
MCV: 98.9 fL (ref 80.0–100.0)
Platelets: 357 K/uL (ref 150–400)
RBC: 3.6 MIL/uL — ABNORMAL LOW (ref 3.87–5.11)
RDW: 19.1 % — ABNORMAL HIGH (ref 11.5–15.5)
WBC: 5.2 K/uL (ref 4.0–10.5)
nRBC: 0 % (ref 0.0–0.2)

## 2023-07-23 LAB — I-STAT CHEM 8, ED
BUN: 64 mg/dL — ABNORMAL HIGH (ref 8–23)
Calcium, Ion: 1.06 mmol/L — ABNORMAL LOW (ref 1.15–1.40)
Chloride: 107 mmol/L (ref 98–111)
Creatinine, Ser: 5.2 mg/dL — ABNORMAL HIGH (ref 0.44–1.00)
Glucose, Bld: 100 mg/dL — ABNORMAL HIGH (ref 70–99)
HCT: 35 % — ABNORMAL LOW (ref 36.0–46.0)
Hemoglobin: 11.9 g/dL — ABNORMAL LOW (ref 12.0–15.0)
Potassium: 4.3 mmol/L (ref 3.5–5.1)
Sodium: 134 mmol/L — ABNORMAL LOW (ref 135–145)
TCO2: 17 mmol/L — ABNORMAL LOW (ref 22–32)

## 2023-07-23 LAB — TROPONIN I (HIGH SENSITIVITY)
Troponin I (High Sensitivity): 26 ng/L — ABNORMAL HIGH (ref <18)
Troponin I (High Sensitivity): 25 ng/L — ABNORMAL HIGH (ref <18)

## 2023-07-23 LAB — LIPASE, BLOOD: Lipase: 42 U/L (ref 11–51)

## 2023-07-23 MED ORDER — PANTOPRAZOLE SODIUM 40 MG IV SOLR
80.0000 mg | Freq: Once | INTRAVENOUS | Status: AC
  Administered 2023-07-23: 80 mg via INTRAVENOUS
  Filled 2023-07-23: qty 20

## 2023-07-23 MED ORDER — FENTANYL CITRATE PF 50 MCG/ML IJ SOSY
50.0000 ug | PREFILLED_SYRINGE | Freq: Once | INTRAMUSCULAR | Status: AC
  Administered 2023-07-23: 50 ug via INTRAVENOUS
  Filled 2023-07-23: qty 1

## 2023-07-23 MED ORDER — LIDOCAINE VISCOUS HCL 2 % MT SOLN
15.0000 mL | Freq: Once | OROMUCOSAL | Status: AC
  Administered 2023-07-23: 15 mL via OROMUCOSAL
  Filled 2023-07-23: qty 15

## 2023-07-23 MED ORDER — HYOSCYAMINE SULFATE 0.125 MG SL SUBL
0.2500 mg | SUBLINGUAL_TABLET | Freq: Once | SUBLINGUAL | Status: AC
  Administered 2023-07-23: 0.25 mg via SUBLINGUAL
  Filled 2023-07-23: qty 2

## 2023-07-23 MED ORDER — HYDRALAZINE HCL 20 MG/ML IJ SOLN
10.0000 mg | Freq: Once | INTRAMUSCULAR | Status: AC
Start: 2023-07-23 — End: 2023-07-23
  Administered 2023-07-23: 10 mg via INTRAVENOUS
  Filled 2023-07-23: qty 1

## 2023-07-23 MED ORDER — ALUM & MAG HYDROXIDE-SIMETH 200-200-20 MG/5ML PO SUSP
30.0000 mL | Freq: Once | ORAL | Status: AC
  Administered 2023-07-23: 30 mL via ORAL
  Filled 2023-07-23: qty 30

## 2023-07-23 NOTE — H&P (Signed)
 History and Physical    [name] JXB:[id number] DOB: [birth year] DOA: 07/23/2023  PCP: Lawrance Presume, MD  Patient coming from: Homeless  I have personally briefly reviewed patient's old medical records in [facility]  Chief Complaint: Abdominal pain  HPI: [name] is a 63 y.o. 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  Patient reports developing upper abdominal pain about 2 days ago.  She has been having significant indigestion and heartburn symptoms.  She reports a couple episodes of nausea and emesis which has resolved.  She reports having some loose stools.  Patient is not on scheduled dialysis due to limitations related to her homelessness.  She last dialyzed on 4/14 during prior admission.  She says she still makes urine.  She has not had any peripheral edema or dyspnea.  Patient reports smoking almost 1 pack of cigarettes daily.  She denies alcohol  use.  She reports occasional marijuana use as well as occasional cocaine use.  She says she last used cocaine 1 week ago.  ED Course  Labs/Imaging on admission: I have personally reviewed following labs and imaging studies.  Initial vitals showed BP 158/86, pulse 89, RR 21, temp 98.7 F, SpO2 100% on room air.  BP as high as 209/91 while in the ED.  Labs showed WBC 5.2, hemoglobin 11.5, platelets 357, sodium 134, potassium 4.1, bicarb 14, BUN 72, creatinine 4.97, serum glucose 102, AST 18, ALT 19, alk phos 133, total bilirubin 0.5, lipase 42.  Troponin 26 > 25.  Portable chest x-ray showed decreased right pleural effusion compared to prior exam with minimal right basilar atelectasis.  RUQ ultrasound with mildly nodular hepatic contours  suggesting hepatic cirrhosis.  Possible hepatic steatosis is noted as well has mild ascites.  No definite cholelithiasis.  Moderate gallbladder wall thickening.  CT abdomen/pelvis without contrast showed changes consistent with duodenitis in the second portion of the duodenum.  No perforation seen.  Mild free fluid within the abdomen pelvis.  Small right effusion with associated atelectatic changes.  Patient was given IV Protonix  80 mg, IV fentanyl , GI cocktail with viscous lidocaine , IV hydralazine  10 mg.  EDP spoke with nephrology (Dr. Higinio Love), their team can consult in a.m. for dialysis needs.  The hospitalist service was consulted to admit.  Review of Systems: All systems reviewed and are negative except as documented in history of present illness above.   Past Medical History:  Diagnosis Date   Acute on chronic blood loss anemia 08/01/2022   Acute on chronic diastolic CHF (congestive heart failure) (HCC) 08/12/2022   Acute on chronic respiratory failure with hypoxia (HCC) 05/16/2023   Acute pulmonary edema (HCC) 05/16/2023   Acute seasonal allergic rhinitis due to pollen 02/22/2016   Alcohol  abuse    Allergy    Anxiety    Arteriovenous fistula infection, initial encounter (HCC) 04/16/2023   Arthritis    Asthma    Cannabis abuse    Cocaine abuse (HCC)    COPD (chronic obstructive pulmonary disease) (HCC)    COPD with acute exacerbation (HCC) 10/25/2022   COVID 05/16/2023   Depression    Drug addiction (HCC)    GERD (gastroesophageal reflux disease)    GIB (gastrointestinal bleeding) 08/19/2019   Heart murmur    Heme  positive stool 08/14/2022   Homelessness    Hx of adenomatous colonic polyps    Hyperlipidemia    Hypertension    pt stated "every once in a while BP will be high but has not been prescribed medication for HTN.    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) (HCC) 02/22/2016   SIRS (systemic inflammatory response syndrome) (HCC) 10/25/2022    Past Surgical History:  Procedure Laterality Date   A/V FISTULAGRAM Right 01/31/2023   Procedure: A/V Fistulagram;  Surgeon: Young Hensen, MD;  Location: [facility];  Service: Cardiovascular;  Laterality: Right;   AV FISTULA PLACEMENT Right 08/14/2022   Procedure: RIGHT ARM BRACHIOBASILIC ATERIOVENOUS FISTULA CREATION;  Surgeon: Carlene Che, MD;  Location: [facility] OR;  Service: Vascular;  Laterality: Right;   BASCILIC VEIN TRANSPOSITION Right 11/13/2022   Procedure: RIGHT ARM SECOND STAGE BASILIC VEIN TRANSPOSITION;  Surgeon: Carlene Che, MD;  Location: [facility] OR;  Service: Vascular;  Laterality: Right;   BIOPSY  01/01/2019   Procedure: BIOPSY;  Surgeon: Nannette Babe, MD;  Location: [facility] ENDOSCOPY;  Service: Endoscopy;;   BIOPSY  11/17/2021   Procedure: BIOPSY;  Surgeon: Lajuan Pila, MD;  Location: [facility] ENDOSCOPY;  Service: Gastroenterology;;   CESAREAN SECTION  1989   COLONOSCOPY  11/07/2020   2018   CYSTOSCOPY W/ URETERAL STENT PLACEMENT Left 08/01/2018   Procedure: CYSTOSCOPY WITH RETROGRADE PYELOGRAM/URETERAL STENT PLACEMENT;  Surgeon: Samson Croak, MD;  Location: [facility] ORS;  Service: Urology;  Laterality: Left;   CYSTOSCOPY WITH RETROGRADE PYELOGRAM, URETEROSCOPY AND STENT PLACEMENT Left 01/15/2019   Procedure: CYSTOSCOPY WITH RETROGRADE PYELOGRAM, URETEROSCOPY AND STENT PLACEMENT;  Surgeon: Samson Croak, MD;  Location: [facility] ORS;  Service: Urology;  Laterality: Left;   ENTEROSCOPY N/A 08/16/2022   Procedure: ENTEROSCOPY;  Surgeon: Brice Campi Albino Alu., MD;  Location: [facility] ENDOSCOPY;  Service: Gastroenterology;  Laterality: N/A;   ENTEROSCOPY N/A 10/31/2022   Procedure: ENTEROSCOPY;  Surgeon: Nannette Babe, MD;  Location: [facility]  ENDOSCOPY;  Service: Gastroenterology;  Laterality: N/A;   ENTEROSCOPY N/A 05/09/2023   Procedure: ENTEROSCOPY;  Surgeon: Daina Drum, MD;  Location: [facility] ENDOSCOPY;  Service: Gastroenterology;  Laterality: N/A;   ESOPHAGOGASTRODUODENOSCOPY (EGD) WITH PROPOFOL  N/A 01/01/2019   Procedure: ESOPHAGOGASTRODUODENOSCOPY (EGD) WITH PROPOFOL ;  Surgeon: Nannette Babe, MD;  Location: [facility] ENDOSCOPY;  Service: Endoscopy;  Laterality: N/A;   ESOPHAGOGASTRODUODENOSCOPY (EGD) WITH PROPOFOL  N/A 08/21/2019   Procedure: ESOPHAGOGASTRODUODENOSCOPY (EGD) WITH PROPOFOL ;  Surgeon: Sergio Dandy, MD;  Location: [facility] ENDOSCOPY;  Service: Endoscopy;  Laterality: N/A;   ESOPHAGOGASTRODUODENOSCOPY (EGD) WITH PROPOFOL  N/A 11/17/2021   Procedure: ESOPHAGOGASTRODUODENOSCOPY (EGD) WITH PROPOFOL ;  Surgeon: Lajuan Pila, MD;  Location: [facility] ENDOSCOPY;  Service: Gastroenterology;  Laterality: N/A;   FRACTURE SURGERY Left 2011   arm   HEMOSTASIS CLIP PLACEMENT  08/16/2022   Procedure: HEMOSTASIS CLIP PLACEMENT;  Surgeon: Normie Becton., MD;  Location: [facility] ENDOSCOPY;  Service: Gastroenterology;;   HOT HEMOSTASIS N/A 08/16/2022   Procedure: HOT HEMOSTASIS (ARGON PLASMA COAGULATION/BICAP);  Surgeon: Normie Becton., MD;  Location: [facility] ENDOSCOPY;  Service: Gastroenterology;  Laterality: N/A;   HOT HEMOSTASIS N/A 10/31/2022   Procedure: HOT HEMOSTASIS (ARGON PLASMA COAGULATION/BICAP);  Surgeon: Laurell Pond  M, MD;  Location: [facility] ENDOSCOPY;  Service: Gastroenterology;  Laterality: N/A;   HOT HEMOSTASIS N/A 05/09/2023   Procedure: HOT HEMOSTASIS (ARGON PLASMA COAGULATION/BICAP);  Surgeon: Daina Drum, MD;  Location: [facility] ENDOSCOPY;  Service: Gastroenterology;  Laterality: N/A;   INSERTION OF DIALYSIS CATHETER Right 08/14/2022   Procedure: INSERTION OF TUNNELED DIALYSIS CATHETER USING PALINDROME CATHETER KIT 19CM;  Surgeon: Carlene Che, MD;  Location: [facility] OR;  Service: Vascular;  Laterality: Right;   POLYPECTOMY  08/16/2022    Procedure: POLYPECTOMY;  Surgeon: Normie Becton., MD;  Location: [facility] ENDOSCOPY;  Service: Gastroenterology;;   SUBMUCOSAL TATTOO INJECTION  08/16/2022   Procedure: SUBMUCOSAL TATTOO INJECTION;  Surgeon: Normie Becton., MD;  Location: [facility] ENDOSCOPY;  Service: Gastroenterology;;   UPPER GASTROINTESTINAL ENDOSCOPY      Social History: Patient reports smoking almost 1 pack of cigarettes daily.  She denies alcohol  use.  She reports occasional marijuana use as well as occasional cocaine use.  She says she last used cocaine 1 week ago.  Allergies  Allergen Reactions   Zestril  [Lisinopril ] Other (See Comments)    Hyperkalemia    Family History  Problem Relation Age of Onset   Diabetes Father    Colon cancer Neg Hx    Esophageal cancer Neg Hx    Rectal cancer Neg Hx    Stomach cancer Neg Hx      Prior to Admission medications   Medication Sig Start Date End Date Taking? Authorizing Provider  acetaminophen  (TYLENOL ) 325 MG tablet Take 2 tablets (650 mg total) by mouth every 6 (six) hours as needed for mild pain (pain score 1-3) (or Fever >/= 101). 07/01/23  Yes Abbe Abate, MD  albuterol  (VENTOLIN  HFA) 108 (90 Base) MCG/ACT inhaler Inhale 2 puffs into the lungs every 6 (six) hours as needed for wheezing or shortness of breath. 07/01/23  Yes Abbe Abate, MD  amLODipine  (NORVASC ) 10 MG tablet Take 1 tablet (10 mg total) by mouth daily. 07/01/23  Yes Abbe Abate, MD  atorvastatin  (LIPITOR) 10 MG tablet Take 1 tablet (10 mg total) by mouth daily. 07/01/23  Yes Abbe Abate, MD  benzonatate  (TESSALON ) 100 MG capsule Take 1 capsule (100 mg total) by mouth 3 (three) times daily. 07/01/23  Yes Abbe Abate, MD  budeson-glycopyrrolate -formoterol  (BREZTRI ) 160-9-4.8 MCG/ACT AERO inhaler Inhale 2 puffs into the lungs 2 (two) times daily. 07/01/23  Yes Abbe Abate, MD  busPIRone  (BUSPAR ) 10 MG tablet Take 1 tablet (10 mg total) by mouth 3 (three) times  daily. 07/01/23 07/31/23 Yes Abbe Abate, MD  calcitRIOL  (ROCALTROL ) 0.25 MCG capsule Take 1 capsule (0.25 mcg total) by mouth daily. 07/02/23  Yes Abbe Abate, MD  carvedilol  (COREG ) 6.25 MG tablet Take 1 tablet (6.25 mg total) by mouth 2 (two) times daily with a meal. 07/01/23  Yes Abbe Abate, MD  ferrous sulfate  325 (65 FE) MG tablet Take 1 tablet (325 mg total) by mouth daily with breakfast. 07/01/23  Yes Abbe Abate, MD  gabapentin  (NEURONTIN ) 100 MG capsule Take 1 capsule (100 mg total) by mouth 3 (three) times daily. 07/01/23  Yes Abbe Abate, MD  oxyCODONE  (OXY IR/ROXICODONE ) 5 MG immediate release tablet Take 1 tablet (5 mg total) by mouth every 4 (four) hours as needed for severe pain (pain score 7-10). 07/01/23  Yes Abbe Abate, MD  pantoprazole  (PROTONIX ) 40 MG tablet Take 1 tablet (40 mg total) by mouth 2 (two) times daily.  07/01/23 07/31/23 Yes Abbe Abate, MD  sertraline  (ZOLOFT ) 25 MG tablet Take 1 tablet (25 mg total) by mouth daily. 07/02/23  Yes Abbe Abate, MD  sodium bicarbonate  650 MG tablet Take 2 tablets (1,300 mg total) by mouth 2 (two) times daily. 07/01/23 07/31/23 Yes Abbe Abate, MD  torsemide  (DEMADEX ) 100 MG tablet Take 1 tablet (100 mg total) by mouth daily. 07/01/23  Yes Abbe Abate, MD  traZODone  (DESYREL ) 50 MG tablet Take 1 tablet (50 mg total) by mouth at bedtime as needed for sleep. 07/01/23  Yes Abbe Abate, MD  TRELEGY ELLIPTA  100-62.5-25 MCG/ACT AEPB Inhale 1 puff into the lungs daily. 07/16/23  Yes [provider]    Physical Exam: Vitals:   07/23/23 2245 07/23/23 2303 07/23/23 2307 07/24/23 0000  BP: (!) 209/91 (!) 195/80  (!) 172/74  Pulse: 69   67  Resp: 19   12  Temp:   98 F (36.7 C)   TempSrc:   Oral   SpO2: 100%   100%   Constitutional: Ill-appearing thin woman resting in bed, NAD, calm Eyes: EOMI, lids and conjunctivae normal ENMT: Mucous membranes are moist. Posterior  pharynx clear of any exudate or lesions.Normal dentition.  Neck: normal, supple, no masses. Respiratory: clear to auscultation bilaterally, no wheezing, no crackles. Normal respiratory effort. No accessory muscle use.  Cardiovascular: Regular rate and rhythm, no murmurs / rubs / gallops. No extremity edema. 2+ pedal pulses.  RUE AVF with palpable thrill. Abdomen: Mild generalized tenderness, no masses palpated. Musculoskeletal: no clubbing / cyanosis. No joint deformity upper and lower extremities. Good ROM, no contractures. Normal muscle tone.  Skin: no rashes, lesions, ulcers. No induration Neurologic: Sensation intact. Strength 5/5 in all 4.  Psychiatric: Normal judgment and insight. Alert and oriented x 3. Normal mood.   EKG: Personally reviewed. Sinus rhythm, rate 85, nonspecific IVCD.  Assessment/Plan Principal Problem:   Duodenitis Active Problems:   ESRD on dialysis [facility])   Essential hypertension   Anemia of chronic disease   COPD (chronic obstructive pulmonary disease) (HCC)   Anxiety and depression   Hyperlipidemia   Homelessness   Polysubstance abuse (HCC)   [name] is a 63 y.o. female with medical history significant for ESRD not on scheduled HD, COPD, HTN, UGI bleed due to AVMs with chronic gastritis/duodenitis, anemia of CKD and chronic blood loss, depression/anxiety, chronic pain, polysubstance use, medical nonadherence, and homelessness who is admitted with duodenitis.  Assessment and Plan: Acute on chronic duodenitis History of UGI bleed due to PUD/AVMs Anemia of CKD and chronic blood loss: Patient presenting with epigastric pain.  CT A/P shows changes of duodenitis.  No perforation seen.  She is not having any more nausea/vomiting and is requesting to advance diet.  Has not had obvious bleeding. - Can start soft diet and advance as tolerated - IV Protonix  40 mg twice daily - Start Carafate  - Hemoglobin stable at 11.5  ESRD: Not on scheduled dialysis due  to homelessness.  Nephrology and TOC know very well.  She has been instructed by renal team to present to [facility], ER anytime she needs dialysis.  Her last dialysis was on 4/14 during prior admission.  She does not appear to have any emergent needs for HD tonight. - Consult nephrology in a.m. for dialysis needs - Continue sodium bicarb tablets  Hypertension: Resume home amlodipine  and Coreg .  COPD: Stable.  Continue Breztri  and albuterol  as needed.  Hyperlipidemia: Continue atorvastatin .  Depression/anxiety:  Continue BuSpar  and Zoloft .  Substance use disorder: Patient reports last using cocaine about 1 week prior to admission.  Since she is smoking almost 1 pack cigarettes daily.  Nicotine  patch provided.  Homelessness: TOC consult.   DVT prophylaxis: SCDs Start: 07/24/23 0006 Code Status:   Code Status: Do not attempt resuscitation (DNR) PRE-ARREST INTERVENTIONS DESIRED discussed with patient on admission. Family Communication: Discussed with patient, she is discussed with family Disposition Plan: Homeless, dispo pending clinical progress. Consults called: EDP spoke with nephrology Severity of Illness: The appropriate patient status for this patient is OBSERVATION. Observation status is judged to be reasonable and necessary in order to provide the required intensity of service to ensure the patient's safety. The patient's presenting symptoms, physical exam findings, and initial radiographic and laboratory data in the context of their medical condition is felt to place them at decreased risk for further clinical deterioration. Furthermore, it is anticipated that the patient will be medically stable for discharge from the hospital within 2 midnights of admission.   Edith Gores MD [facility]  If 7PM-7AM, please contact night-coverage [email or web address]  07/24/2023, 12:42 AM

## 2023-07-23 NOTE — ED Triage Notes (Signed)
 Pt bib ems with epigastric pain X3 days, worsening today. Dialysis pt, has not had a treatment in 3 weeks. Pt is homeless. Reports bowel movements always look dark.  Afebrile and hypertensive CBG 69 BP 200 systolic 99% RA HR 90

## 2023-07-23 NOTE — ED Notes (Signed)
 Patient transported to CT

## 2023-07-23 NOTE — ED Provider Notes (Addendum)
 [place] EMERGENCY DEPARTMENT AT [facility] Provider Note   CSN: [id number] Arrival date & time: 07/23/23  1732     History  Chief Complaint  Patient presents with   Abdominal Pain    [name] is a 63 y.o. female.  63 year old female with past medical history of hypertension and end-stage renal disease on dialysis presenting to the emergency department today with abdominal pain.  Patient states she has been having upper abdominal pain over the past few days.  She states that this been relatively constant.  She states that she has been having some loose stools with this.  She states that she has not had dialysis since she left the hospital which was around 3 weeks ago.  She states that the pain is in her epigastric and right upper quadrant and radiates through to her back.  She states she has not gone to dialysis because she is homeless.   Abdominal Pain      Home Medications Prior to Admission medications   Medication Sig Start Date End Date Taking? Authorizing Provider  acetaminophen  (TYLENOL ) 325 MG tablet Take 2 tablets (650 mg total) by mouth every 6 (six) hours as needed for mild pain (pain score 1-3) (or Fever >/= 101). 07/01/23  Yes Abbe Abate, MD  albuterol  (VENTOLIN  HFA) 108 (90 Base) MCG/ACT inhaler Inhale 2 puffs into the lungs every 6 (six) hours as needed for wheezing or shortness of breath. 07/01/23  Yes Abbe Abate, MD  amLODipine  (NORVASC ) 10 MG tablet Take 1 tablet (10 mg total) by mouth daily. 07/01/23  Yes Abbe Abate, MD  atorvastatin  (LIPITOR) 10 MG tablet Take 1 tablet (10 mg total) by mouth daily. 07/01/23  Yes Abbe Abate, MD  benzonatate  (TESSALON ) 100 MG capsule Take 1 capsule (100 mg total) by mouth 3 (three) times daily. 07/01/23  Yes Abbe Abate, MD  budeson-glycopyrrolate -formoterol  (BREZTRI ) 160-9-4.8 MCG/ACT AERO inhaler Inhale 2 puffs into the lungs 2 (two) times daily. 07/01/23  Yes Abbe Abate,  MD  busPIRone  (BUSPAR ) 10 MG tablet Take 1 tablet (10 mg total) by mouth 3 (three) times daily. 07/01/23 07/31/23 Yes Abbe Abate, MD  calcitRIOL  (ROCALTROL ) 0.25 MCG capsule Take 1 capsule (0.25 mcg total) by mouth daily. 07/02/23  Yes Abbe Abate, MD  carvedilol  (COREG ) 6.25 MG tablet Take 1 tablet (6.25 mg total) by mouth 2 (two) times daily with a meal. 07/01/23  Yes Abbe Abate, MD  ferrous sulfate  325 (65 FE) MG tablet Take 1 tablet (325 mg total) by mouth daily with breakfast. 07/01/23  Yes Abbe Abate, MD  gabapentin  (NEURONTIN ) 100 MG capsule Take 1 capsule (100 mg total) by mouth 3 (three) times daily. 07/01/23  Yes Abbe Abate, MD  oxyCODONE  (OXY IR/ROXICODONE ) 5 MG immediate release tablet Take 1 tablet (5 mg total) by mouth every 4 (four) hours as needed for severe pain (pain score 7-10). 07/01/23  Yes Abbe Abate, MD  pantoprazole  (PROTONIX ) 40 MG tablet Take 1 tablet (40 mg total) by mouth 2 (two) times daily. 07/01/23 07/31/23 Yes Abbe Abate, MD  sertraline  (ZOLOFT ) 25 MG tablet Take 1 tablet (25 mg total) by mouth daily. 07/02/23  Yes Abbe Abate, MD  sodium bicarbonate  650 MG tablet Take 2 tablets (1,300 mg total) by mouth 2 (two) times daily. 07/01/23 07/31/23 Yes Abbe Abate, MD  torsemide  (DEMADEX ) 100 MG tablet Take 1 tablet (100 mg total) by mouth  daily. 07/01/23  Yes Abbe Abate, MD  traZODone  (DESYREL ) 50 MG tablet Take 1 tablet (50 mg total) by mouth at bedtime as needed for sleep. 07/01/23  Yes Abbe Abate, MD  TRELEGY ELLIPTA  100-62.5-25 MCG/ACT AEPB Inhale 1 puff into the lungs daily. 07/16/23  Yes [provider]      Allergies    Zestril  [lisinopril ]    Review of Systems   Review of Systems  Gastrointestinal:  Positive for abdominal pain.  All other systems reviewed and are negative.   Physical Exam Updated Vital Signs BP (!) 209/91   Pulse 69   Temp 98.2 F (36.8 C) (Oral)   Resp 19    SpO2 100%  Physical Exam Vitals and nursing note reviewed.   Gen: Appears uncomfortable Eyes: PERRL, EOMI HEENT: no oropharyngeal swelling Neck: trachea midline Resp: clear to auscultation bilaterally Card: RRR, no murmurs, rubs, or gallops Abd: Tender over the epigastrium and right upper quadrant with positive Murphy sign Extremities: no calf tenderness, no edema Vascular: 2+ radial pulses bilaterally, 2+ DP pulses bilaterally Skin: no rashes Psyc: acting appropriately   ED Results / Procedures / Treatments   Labs (all labs ordered are listed, but only abnormal results are displayed) Labs Reviewed  COMPREHENSIVE METABOLIC PANEL WITH GFR - Abnormal; Notable for the following components:      Result Value   Sodium 134 (*)    CO2 14 (*)    Glucose, Bld 102 (*)    BUN 72 (*)    Creatinine, Ser 4.97 (*)    Calcium  8.8 (*)    Albumin  3.2 (*)    Alkaline Phosphatase 133 (*)    GFR, Estimated 9 (*)    Anion gap 18 (*)    All other components within normal limits  CBC - Abnormal; Notable for the following components:   RBC 3.60 (*)    Hemoglobin 11.5 (*)    HCT 35.6 (*)    RDW 19.1 (*)    All other components within normal limits  I-STAT CHEM 8, ED - Abnormal; Notable for the following components:   Sodium 134 (*)    BUN 64 (*)    Creatinine, Ser 5.20 (*)    Glucose, Bld 100 (*)    Calcium , Ion 1.06 (*)    TCO2 17 (*)    Hemoglobin 11.9 (*)    HCT 35.0 (*)    All other components within normal limits  I-STAT VENOUS BLOOD GAS, ED - Abnormal; Notable for the following components:   pH, Ven 7.452 (*)    pCO2, Ven 16.0 (*)    pO2, Ven 160 (*)    Bicarbonate 11.1 (*)    TCO2 12 (*)    Acid-base deficit 11.0 (*)    Sodium 132 (*)    Calcium , Ion 0.85 (*)    HCT 32.0 (*)    Hemoglobin 10.9 (*)    All other components within normal limits  TROPONIN I (HIGH SENSITIVITY) - Abnormal; Notable for the following components:   Troponin I (High Sensitivity) 26 (*)    All  other components within normal limits  TROPONIN I (HIGH SENSITIVITY) - Abnormal; Notable for the following components:   Troponin I (High Sensitivity) 25 (*)    All other components within normal limits  LIPASE, BLOOD    EKG EKG Interpretation Date/Time:  Wednesday Jul 23 2023 17:47:39 EDT Ventricular Rate:  85 PR Interval:  150 QRS Duration:  188 QT Interval:  418 QTC Calculation:  497 R Axis:   44  Text Interpretation: Normal sinus rhythm Non-specific intra-ventricular conduction block Minimal voltage criteria for LVH, may be normal variant ( Sokolow-Lyon ) Abnormal ECG When compared with ECG of 24-Jun-2023 14:13, PREVIOUS ECG IS PRESENT Confirmed by Abner Hoffman [phone]) on 07/23/2023 6:19:40 PM  Radiology CT ABDOMEN PELVIS WO CONTRAST Result Date: 07/23/2023 CLINICAL DATA:  Epigastric pain for several days, dark bowel movement EXAM: CT ABDOMEN AND PELVIS WITHOUT CONTRAST TECHNIQUE: Multidetector CT imaging of the abdomen and pelvis was performed following the standard protocol without IV contrast. RADIATION DOSE REDUCTION: This exam was performed according to the departmental dose-optimization program which includes automated exposure control, adjustment of the mA and/or kV according to patient size and/or use of iterative reconstruction technique. COMPARISON:  10/25/2022 FINDINGS: Lower chest: Left lung base is clear. Right-sided pleural effusion is noted with some associated atelectatic changes. Hepatobiliary: No focal liver abnormality is seen. No gallstones, gallbladder wall thickening, or biliary dilatation. Mild free perihepatic fluid is noted. Pancreas: Unremarkable. No pancreatic ductal dilatation or surrounding inflammatory changes. Spleen: Normal in size without focal abnormality. Adrenals/Urinary Tract: Adrenal glands are within normal limits. No renal calculi or urinary tract obstructive changes are noted. Now rotation is noted in the right kidney although stable from the prior exam.  Ureters are within normal limits. Bladder is well distended. Stomach/Bowel: Scattered diverticular change of the colon is noted without evidence of diverticulitis. Appendix is within normal limits. Stomach is within normal limits. Jejunum and ileum appear unremarkable. Wall thickening and inflammatory changes are noted in the second portion the duodenum consistent with focal duodenitis. No definitive ulcer is seen. No perforation is noted. Vascular/Lymphatic: Aortic atherosclerosis. No enlarged abdominal or pelvic lymph nodes. Reproductive: Uterus and bilateral adnexa are unremarkable. Other: Mild free fluid is noted in the pelvis and surrounding the liver. Musculoskeletal: No acute or significant osseous findings. IMPRESSION: Changes consistent with duodenitis in the second portion of the duodenum. No perforation is seen. Mild free fluid is noted within the abdomen and pelvis. Small right effusion with associated atelectatic changes. Electronically Signed   By: Violeta Grey M.D.   On: 07/23/2023 21:29   US  Abdomen Limited RUQ (LIVER/GB) Result Date: 07/23/2023 CLINICAL DATA:  Right upper quadrant abdominal pain. EXAM: ULTRASOUND ABDOMEN LIMITED RIGHT UPPER QUADRANT COMPARISON:  October 25, 2022.  November 15, 2021. FINDINGS: Gallbladder: No cholelithiasis is noted, but moderate wall thickening is noted at 5.5 mm which may be due to surrounding ascites or adjacent hepatocellular disease. Positive sonographic Murphy's sign is noted. Common bile duct: Diameter: 4 mm which is within normal limits. Liver: Nodular hepatic contours are noted suggesting possible hepatic cirrhosis. Increased echogenicity of hepatic parenchyma is noted suggesting hepatic steatosis. Portal vein is patent on color Doppler imaging with normal direction of blood flow towards the liver. Other: Mild ascites is noted. 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 Possible hepatic steatosis is noted as well. Mild  ascites is noted. No definite cholelithiasis is noted. Moderate gallbladder wall thickening is noted most likely due to surrounding ascites or diffuse hepatocellular disease. Electronically Signed   By: Rosalene Colon M.D.   On: 07/23/2023 19:35   DG Chest Portable 1 View Result Date: 07/23/2023 CLINICAL DATA:  Shortness of breath. EXAM: PORTABLE CHEST 1 VIEW COMPARISON:  June 24, 2023. FINDINGS: Stable cardiomegaly. Left lung is clear. Small right pleural effusion is noted which is significantly decreased compared to prior exam. Minimal right basilar subsegmental atelectasis is noted.  Bony thorax is unremarkable. IMPRESSION: Decreased right pleural effusion compared to prior exam with minimal right basilar atelectasis. Electronically Signed   By: Rosalene Colon M.D.   On: 07/23/2023 18:43    Procedures Procedures    Medications Ordered in ED Medications  hydrALAZINE  (APRESOLINE ) injection 10 mg (has no administration in time range)  fentaNYL  (SUBLIMAZE ) injection 50 mcg (has no administration in time range)  fentaNYL  (SUBLIMAZE ) injection 50 mcg (50 mcg Intravenous Given 07/23/23 1836)  alum & mag hydroxide-simeth (MAALOX/MYLANTA) 200-200-20 MG/5ML suspension 30 mL (30 mLs Oral Given 07/23/23 1835)  hyoscyamine (LEVSIN SL) SL tablet 0.25 mg (0.25 mg Sublingual Given 07/23/23 1835)  lidocaine  (XYLOCAINE ) 2 % viscous mouth solution 15 mL (15 mLs Mouth/Throat Given 07/23/23 2220)  alum & mag hydroxide-simeth (MAALOX/MYLANTA) 200-200-20 MG/5ML suspension 30 mL (30 mLs Oral Given 07/23/23 2220)  pantoprazole  (PROTONIX ) injection 80 mg (80 mg Intravenous Given 07/23/23 2227)    ED Course/ Medical Decision Making/ A&P                                 Medical Decision Making 63 year old female with past medical history of hypertension and end-stage renal disease presenting to the emergency department today with upper abdominal pain.  I will further evaluate the patient here with basic labs including LFTs and  a lipase to evaluate for hepatobiliary otology or pancreatitis.  Will obtain a right upper quadrant ultrasound to evaluate for cholelithiasis or cholecystitis.  Will give the patient fentanyl  for pain.  Also obtain an EKG and troponin to evaluate for atypical ACS.  The patient is otherwise neurovascularly intact so suspicion for aortic dissection is low at this time.  I will reevaluate for ultimate disposition.  The patient CT scan shows duodenitis.  Ultrasound did not really show any acute findings.  The patient did have a low bicarbonate level but her potassium was within normal limits.  Case was discussed with Dr. Higinio Love.  She recommended checking a venous blood gas in regards to her bicarb but has no plans for emergent dialysis this evening.  The patient is given Protonix  here as well as a GI cocktail and fentanyl  and was still having a lot of pain.  She is given additional dose of fentanyl .  Blood pressure is significantly elevated here which I suspect is likely due to pain as well as her nonadherence to her medications.  She is given hydralazine  here as well as additional pain medications.  A call was placed to hospitalist service for admission for pain control and dialysis in the morning.  CRITICAL CARE Performed by: Carin Charleston   Total critical care time: 30 minutes  Critical care time was exclusive of separately billable procedures and treating other patients.  Critical care was necessary to treat or prevent imminent or life-threatening deterioration.  Critical care was time spent personally by me on the following activities: development of treatment plan with patient and/or surrogate as well as nursing, discussions with consultants, evaluation of patient's response to treatment, examination of patient, obtaining history from patient or surrogate, ordering and performing treatments and interventions, ordering and review of laboratory studies, ordering and review of radiographic studies, pulse  oximetry and re-evaluation of patient's condition.   Amount and/or Complexity of Data Reviewed Labs: ordered. Radiology: ordered.  Risk OTC drugs. Prescription drug management. Decision regarding hospitalization.           Final Clinical Impression(s) / ED  Diagnoses Final diagnoses:  Duodenitis  Noncompliance of patient with renal dialysis [facility])  Hypertensive urgency    Rx / DC Orders ED Discharge Orders     None         Carin Charleston, MD 07/23/23 2253    Carin Charleston, MD 07/23/23 [id number]

## 2023-07-24 DIAGNOSIS — K2980 Duodenitis without bleeding: Principal | ICD-10-CM | POA: Diagnosis present

## 2023-07-24 LAB — BASIC METABOLIC PANEL WITH GFR
Anion gap: 14 (ref 5–15)
BUN: 63 mg/dL — ABNORMAL HIGH (ref 8–23)
CO2: 14 mmol/L — ABNORMAL LOW (ref 22–32)
Calcium: 8.4 mg/dL — ABNORMAL LOW (ref 8.9–10.3)
Chloride: 104 mmol/L (ref 98–111)
Creatinine, Ser: 4.77 mg/dL — ABNORMAL HIGH (ref 0.44–1.00)
GFR, Estimated: 10 mL/min — ABNORMAL LOW
Glucose, Bld: 99 mg/dL (ref 70–99)
Potassium: 4.1 mmol/L (ref 3.5–5.1)
Sodium: 132 mmol/L — ABNORMAL LOW (ref 135–145)

## 2023-07-24 LAB — CBC
HCT: 29.1 % — ABNORMAL LOW (ref 36.0–46.0)
Hemoglobin: 9.7 g/dL — ABNORMAL LOW (ref 12.0–15.0)
MCH: 31.8 pg (ref 26.0–34.0)
MCHC: 33.3 g/dL (ref 30.0–36.0)
MCV: 95.4 fL (ref 80.0–100.0)
Platelets: 311 K/uL (ref 150–400)
RBC: 3.05 MIL/uL — ABNORMAL LOW (ref 3.87–5.11)
RDW: 19.2 % — ABNORMAL HIGH (ref 11.5–15.5)
WBC: 4.7 K/uL (ref 4.0–10.5)
nRBC: 0 % (ref 0.0–0.2)

## 2023-07-24 LAB — GLUCOSE, CAPILLARY: Glucose-Capillary: 104 mg/dL — ABNORMAL HIGH (ref 70–99)

## 2023-07-24 LAB — MRSA NEXT GEN BY PCR, NASAL: MRSA by PCR Next Gen: DETECTED — AB

## 2023-07-24 MED ORDER — NICOTINE 21 MG/24HR TD PT24
21.0000 mg | MEDICATED_PATCH | Freq: Every day | TRANSDERMAL | Status: DC
  Administered 2023-07-24 – 2023-07-26 (×3): 21 mg via TRANSDERMAL
  Filled 2023-07-24 (×3): qty 1

## 2023-07-24 MED ORDER — ALBUTEROL SULFATE (2.5 MG/3ML) 0.083% IN NEBU
3.0000 mL | INHALATION_SOLUTION | Freq: Four times a day (QID) | RESPIRATORY_TRACT | Status: DC | PRN
  Administered 2023-07-25 – 2023-07-26 (×2): 3 mL via RESPIRATORY_TRACT
  Filled 2023-07-24 (×2): qty 3

## 2023-07-24 MED ORDER — HYDROMORPHONE HCL 1 MG/ML IJ SOLN
0.5000 mg | INTRAMUSCULAR | Status: AC | PRN
  Administered 2023-07-24 (×2): 0.5 mg via INTRAVENOUS
  Filled 2023-07-24 (×2): qty 0.5

## 2023-07-24 MED ORDER — PANTOPRAZOLE SODIUM 40 MG IV SOLR
40.0000 mg | Freq: Two times a day (BID) | INTRAVENOUS | Status: DC
  Administered 2023-07-24 – 2023-07-26 (×5): 40 mg via INTRAVENOUS
  Filled 2023-07-24 (×5): qty 10

## 2023-07-24 MED ORDER — ACETAMINOPHEN 500 MG PO TABS: 1000.0000 mg | ORAL_TABLET | Freq: Four times a day (QID) | ORAL | Status: DC | PRN

## 2023-07-24 MED ORDER — ONDANSETRON HCL 4 MG/2ML IJ SOLN
4.0000 mg | Freq: Four times a day (QID) | INTRAMUSCULAR | Status: DC | PRN
  Administered 2023-07-24: 4 mg via INTRAVENOUS
  Filled 2023-07-24: qty 2

## 2023-07-24 MED ORDER — SODIUM BICARBONATE 650 MG PO TABS
1300.0000 mg | ORAL_TABLET | Freq: Two times a day (BID) | ORAL | Status: DC
  Administered 2023-07-24: 1300 mg via ORAL
  Filled 2023-07-24: qty 2

## 2023-07-24 MED ORDER — SODIUM BICARBONATE 650 MG PO TABS
1300.0000 mg | ORAL_TABLET | Freq: Three times a day (TID) | ORAL | Status: DC
Start: 2023-07-24 — End: 2023-07-26
  Administered 2023-07-24 – 2023-07-26 (×6): 1300 mg via ORAL
  Filled 2023-07-24 (×6): qty 2

## 2023-07-24 MED ORDER — BUSPIRONE HCL 5 MG PO TABS
10.0000 mg | ORAL_TABLET | Freq: Three times a day (TID) | ORAL | Status: DC
Start: 2023-07-24 — End: 2023-07-26
  Administered 2023-07-24 – 2023-07-26 (×7): 10 mg via ORAL
  Filled 2023-07-24 (×7): qty 2

## 2023-07-24 MED ORDER — SODIUM CHLORIDE (PF) 0.9 % IJ SOLN
INTRAMUSCULAR | Status: AC
  Administered 2023-07-24: 10 mL
  Filled 2023-07-24: qty 10

## 2023-07-24 MED ORDER — BUDESON-GLYCOPYRROL-FORMOTEROL 160-9-4.8 MCG/ACT IN AERO
2.0000 | INHALATION_SPRAY | Freq: Two times a day (BID) | RESPIRATORY_TRACT | Status: DC
  Administered 2023-07-24 – 2023-07-26 (×4): 2 via RESPIRATORY_TRACT
  Filled 2023-07-24: qty 5.9

## 2023-07-24 MED ORDER — DARBEPOETIN ALFA 60 MCG/0.3ML IJ SOSY
60.0000 ug | PREFILLED_SYRINGE | Freq: Once | INTRAMUSCULAR | Status: AC
  Administered 2023-07-24: 60 ug via SUBCUTANEOUS
  Filled 2023-07-24: qty 0.3

## 2023-07-24 MED ORDER — ONDANSETRON HCL 4 MG PO TABS: 4.0000 mg | ORAL_TABLET | Freq: Four times a day (QID) | ORAL | Status: DC | PRN

## 2023-07-24 MED ORDER — ACETAMINOPHEN 650 MG RE SUPP: 650.0000 mg | Freq: Four times a day (QID) | RECTAL | Status: DC | PRN

## 2023-07-24 MED ORDER — SENNOSIDES-DOCUSATE SODIUM 8.6-50 MG PO TABS: 1.0000 | ORAL_TABLET | Freq: Every evening | ORAL | Status: DC | PRN

## 2023-07-24 MED ORDER — CARVEDILOL 6.25 MG PO TABS
6.2500 mg | ORAL_TABLET | Freq: Two times a day (BID) | ORAL | Status: DC
  Administered 2023-07-24 – 2023-07-26 (×4): 6.25 mg via ORAL
  Filled 2023-07-24 (×4): qty 1

## 2023-07-24 MED ORDER — GABAPENTIN 100 MG PO CAPS
100.0000 mg | ORAL_CAPSULE | Freq: Three times a day (TID) | ORAL | Status: DC
  Administered 2023-07-24 – 2023-07-26 (×7): 100 mg via ORAL
  Filled 2023-07-24 (×7): qty 1

## 2023-07-24 MED ORDER — SERTRALINE HCL 50 MG PO TABS
25.0000 mg | ORAL_TABLET | Freq: Every day | ORAL | Status: DC
  Administered 2023-07-24 – 2023-07-26 (×3): 25 mg via ORAL
  Filled 2023-07-24 (×3): qty 1

## 2023-07-24 MED ORDER — ATORVASTATIN CALCIUM 10 MG PO TABS
10.0000 mg | ORAL_TABLET | Freq: Every day | ORAL | Status: DC
  Administered 2023-07-24 – 2023-07-26 (×3): 10 mg via ORAL
  Filled 2023-07-24 (×3): qty 1

## 2023-07-24 MED ORDER — TRAZODONE HCL 50 MG PO TABS
50.0000 mg | ORAL_TABLET | Freq: Every evening | ORAL | Status: DC | PRN
  Administered 2023-07-24 (×2): 50 mg via ORAL
  Filled 2023-07-24 (×2): qty 1

## 2023-07-24 MED ORDER — TORSEMIDE 20 MG PO TABS: 100.0000 mg | ORAL_TABLET | Freq: Every day | ORAL | Status: DC

## 2023-07-24 MED ORDER — AMLODIPINE BESYLATE 10 MG PO TABS
10.0000 mg | ORAL_TABLET | Freq: Every day | ORAL | Status: DC
  Administered 2023-07-24 – 2023-07-26 (×3): 10 mg via ORAL
  Filled 2023-07-24 (×3): qty 1

## 2023-07-24 MED ORDER — ORAL CARE MOUTH RINSE: 15.0000 mL | OROMUCOSAL | Status: DC | PRN

## 2023-07-24 MED ORDER — OXYCODONE HCL 5 MG PO TABS
5.0000 mg | ORAL_TABLET | Freq: Four times a day (QID) | ORAL | Status: DC | PRN
  Administered 2023-07-24 – 2023-07-26 (×5): 5 mg via ORAL
  Filled 2023-07-24 (×6): qty 1

## 2023-07-24 MED ORDER — SUCRALFATE 1 GM/10ML PO SUSP
1.0000 g | Freq: Three times a day (TID) | ORAL | Status: DC
  Administered 2023-07-24 – 2023-07-26 (×11): 1 g via ORAL
  Filled 2023-07-24 (×11): qty 10

## 2023-07-24 NOTE — Consult Note (Addendum)
 [facility]  INPATIENT CONSULTATION  Reason for Consultation: ESRD Requesting Provider: Dr. [name] Amel  HPI: [name] is an 63 y.o. female ESRD not on dialysis, mutliple other medical problems as listed below currenly admitted for duodenitis and nephrology is consulted for evaluation and management of her ESRD.   She has a host of social issues that have been explored which has ultimately resulted in her not attending dialysis regularly or having a dialysis center.  She is currently admitted after presenting to the ED yesterday with abd pain and found to have duodenitis.   This morning she is feeling some improvement in abd pain after PPI, carafate .   She denies any uremic symptoms.  She's had edema/dyspnea in the past but denies any current symptoms.  Continues to urinate a normal amount.  Her AVF had a bad infiltration in 06/2023 during use which has finally improved.  She does not desire dialysis at this time.   She remains homeless.  Per chart she smokes 1ppd, uses MJ and cocaine occasionally.   PMH: Past Medical History:  Diagnosis Date   Acute on chronic blood loss anemia 08/01/2022   Acute on chronic diastolic CHF (congestive heart failure) (HCC) 08/12/2022   Acute on chronic respiratory failure with hypoxia (HCC) 05/16/2023   Acute pulmonary edema (HCC) 05/16/2023   Acute seasonal allergic rhinitis due to pollen 02/22/2016   Alcohol  abuse    Allergy    Anxiety    Arteriovenous fistula infection, initial encounter (HCC) 04/16/2023   Arthritis    Asthma    Cannabis abuse    Cocaine abuse (HCC)    COPD (chronic obstructive pulmonary disease) (HCC)    COPD with acute exacerbation (HCC) 10/25/2022   COVID 05/16/2023   Depression    Drug addiction (HCC)    GERD (gastroesophageal reflux disease)    GIB (gastrointestinal bleeding) 08/19/2019   Heart murmur    Heme positive stool 08/14/2022   Homelessness    Hx of adenomatous colonic polyps    Hyperlipidemia     Hypertension    pt stated "every once in a while BP will be high but has not been prescribed medication for HTN.    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) 02/22/2016   SIRS (systemic inflammatory response syndrome) (HCC) 10/25/2022   PSH: Past Surgical History:  Procedure Laterality Date   A/V FISTULAGRAM Right 01/31/2023   Procedure: A/V Fistulagram;  Surgeon: Young Hensen, MD;  [facility]: [facility] IN[facility]IVE [facility];  Service: Cardiovascular;  Laterality: Right;   AV FISTULA PLACEMENT Right 08/14/2022   Procedure: RIGHT ARM BRACHIOBASILIC ATERIOVENOUS FISTULA CREATION;  Surgeon: Carlene Che, MD;  Location: [facility] OR;  Service: Vascular;  Laterality: Right;   BASCILIC VEIN TRANSPOSITION Right 11/13/2022   Procedure: RIGHT ARM SECOND STAGE BASILIC VEIN TRANSPOSITION;  Surgeon: Carlene Che, MD;  Location: [facility] OR;  Service: Vascular;  Laterality: Right;   BIOPSY  01/01/2019   Procedure: BIOPSY;  Surgeon: Nannette Babe, MD;  Location: [facility] ENDOSCOPY;  Service: Endoscopy;;   BIOPSY  11/17/2021   Procedure: BIOPSY;  Surgeon: Lajuan Pila, MD;  Location: [facility] ENDOSCOPY;  Service: Gastroenterology;;   CESAREAN SECTION  1989   COLONOSCOPY  11/07/2020   2018   CYSTOSCOPY W/ URETERAL STENT PLACEMENT Left 08/01/2018   Procedure: CYSTOSCOPY WITH RETROGRADE PYELOGRAM/URETERAL STENT PLACEMENT;  Surgeon: Samson Croak, MD;  Location: [facility] ORS;  Service: Urology;  Laterality: Left;   CYSTOSCOPY WITH RETROGRADE PYELOGRAM, URETEROSCOPY AND STENT PLACEMENT Left 01/15/2019   Procedure: CYSTOSCOPY WITH RETROGRADE PYELOGRAM, URETEROSCOPY AND STENT PLACEMENT;  Surgeon: Samson Croak, MD;  Location: [facility] ORS;  Service: Urology;  Laterality:  Left;   ENTEROSCOPY N/A 08/16/2022   Procedure: ENTEROSCOPY;  Surgeon: Brice Campi Albino Alu., MD;  Location: [facility] ENDOSCOPY;  Service: Gastroenterology;  Laterality: N/A;   ENTEROSCOPY N/A 10/31/2022   Procedure: ENTEROSCOPY;  Surgeon: Nannette Babe, MD;  Location: [facility] ENDOSCOPY;  Service: Gastroenterology;  Laterality: N/A;   ENTEROSCOPY N/A 05/09/2023   Procedure: ENTEROSCOPY;  Surgeon: Daina Drum, MD;  Location: [facility] ENDOSCOPY;  Service: Gastroenterology;  Laterality: N/A;   ESOPHAGOGASTRODUODENOSCOPY (EGD) WITH PROPOFOL  N/A 01/01/2019   Procedure: ESOPHAGOGASTRODUODENOSCOPY (EGD) WITH PROPOFOL ;  Surgeon: Nannette Babe, MD;  Location: [facility] ENDOS[facility];  Service: Endoscopy;  Laterality: N/A;   ESOPHAGOGASTRODUODENOSCOPY (EGD) WITH PROPOFOL  N/A 08/21/2019   Procedure: ESOPHAGOGASTRODUODENOSCOPY (EGD) WITH PROPOFOL ;  Surgeon: Sergio Dandy, MD;  Location: [facility] ENDOSCOPY;  Service: Endoscopy;  Laterality: N/A;   ESOPHAGOGASTRODUODENOSCOPY (EGD) WITH PROPOFOL  N/A 11/17/2021   Procedure: ESOPHAGOGASTRODUODENOSCOPY (EGD) WITH PROPOFOL ;  Surgeon: Lajuan Pila, MD;  Location: [facility] ENDOSCOPY;  Service: Gastroenterology;  Laterality: N/A;   FRACTURE SURGERY Left 2011   arm   HEMOSTASIS CLIP PLACEMENT  08/16/2022   Procedure: HEMOSTASIS CLIP PLACEMENT;  Surgeon: Normie Becton., MD;  Location: [facility] ENDOSCOPY;  Service: Gastroenterology;;   HOT HEMOSTASIS N/A 08/16/2022   Procedure: HOT HEMOSTASIS (ARGON PLASMA COAGULATION/BICAP);  Surgeon: Normie Becton., MD;  Location: [facility] ENDOSCOPY;  Service: Gastroenterology;  Laterality: N/A;   HOT HEMOSTASIS N/A 10/31/2022   Procedure: HOT HEMOSTASIS (ARGON PLASMA COAGULATION/BICAP);  Surgeon: Nannette Babe, MD;  Location: [facility] ENDOSCOPY;  Service: Gastroenterology;  Laterality: N/A;   HOT HEMOSTASIS N/A 05/09/2023   Procedure: HOT HEMOSTASIS (ARGON PLASMA COAGULATION/BICAP);  Surgeon: Daina Drum, MD;  Location: [facility] ENDOSCOPY;  Service: Gastroenterology;   Laterality: N/A;   INSERTION OF DIALYSIS CATHETER Right 08/14/2022   Procedure: INSERTION OF TUNNELED DIALYSIS CATHETER USING PALINDROME CATHETER KIT 19CM;  Surgeon: Carlene Che, MD;  Location: [facility] OR;  Service: Vascular;  Laterality: Right;   POLYPECTOMY  08/16/2022   Procedure: POLYPECTOMY;  Surgeon: Normie Becton., MD;  Location: [facility] ENDOSCOPY;  Service: Gastroenterology;;   SUBMUCOSAL TATTOO INJECTION  08/16/2022   Procedure: SUBMUCOSAL TATTOO INJECTION;  Surgeon: Normie Becton., MD;  Location: [facility] ENDOSCOPY;  Service: Gastroenterology;;   UPPER GASTROINTESTINAL ENDOSCOPY     Past Medical History:  Diagnosis Date   Acute on chronic blood loss anemia 08/01/2022   Acute on chronic diastolic CHF (congestive heart failure) (HCC) 08/12/2022   Acute on chronic respiratory failure with hypoxia (HCC) 05/16/2023   Acute pulmonary edema (HCC) 05/16/2023   Acute seasonal allergic rhinitis due to pollen 02/22/2016   Alcohol  abuse    Allergy    Anxiety    Arteriovenous fistula infection, initial encounter (HCC) 04/16/2023   Arthritis    Asthma    Cannabis abuse    Cocaine abuse (HCC)    COPD (chronic obstructive pulmonary disease) (HCC)    COPD with acute exacerbation (HCC) 10/25/2022   COVID 05/16/2023   Depression    Drug addiction (HCC)    GERD (gastroesophageal reflux disease)  GIB (gastrointestinal bleeding) 08/19/2019   Heart murmur    Heme positive stool 08/14/2022   Homelessness    Hx of adenomatous colonic polyps    Hyperlipidemia    Hypertension    pt stated "every once in a while BP will be high but has not been prescribed medication for HTN.    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  Medications Prior to Admission  Medication Sig Dispense Refill   acetaminophen  (TYLENOL ) 325 MG tablet Take 2 tablets (650 mg total) by mouth every 6 (six) hours as needed for mild pain (pain score 1-3) (or Fever >/= 101). 30 tablet 0   albuterol  (VENTOLIN  HFA) 108 (90 Base) MCG/ACT inhaler Inhale 2 puffs into the lungs every 6 (six) hours as needed for wheezing or shortness of breath. 18 g 0   amLODipine  (NORVASC ) 10 MG tablet Take 1 tablet (10 mg total) by mouth daily. 30 tablet 0   atorvastatin  (LIPITOR) 10 MG tablet Take 1 tablet (10 mg total) by mouth daily. 30 tablet 0   benzonatate  (TESSALON ) 100 MG capsule Take 1 capsule (100 mg total) by mouth 3 (three) times daily. 20 capsule 0   budeson-glycopyrrolate -formoterol  (BREZTRI ) 160-9-4.8 MCG/ACT AERO inhaler Inhale 2 puffs into the lungs 2 (two) times daily. 10.7 g 0   busPIRone  (BUSPAR ) 10 MG tablet Take 1 tablet (10 mg total) by mouth 3 (three) times daily. 90 tablet 0   calcitRIOL  (ROCALTROL ) 0.25 MCG capsule Take 1 capsule (0.25 mcg total) by mouth daily. 30 capsule 0   carvedilol  (COREG ) 6.25 MG tablet Take 1 tablet (6.25 mg total) by mouth 2 (two) times daily with a meal. 60 tablet 0   ferrous sulfate  325 (65 FE) MG tablet Take 1 tablet (325 mg total) by mouth daily with breakfast. 30 tablet 0   gabapentin  (NEURONTIN ) 100 MG capsule Take 1 capsule (100 mg total) by mouth 3 (three) times daily. 90 capsule 0   oxyCODONE  (OXY IR/ROXICODONE ) 5 MG immediate release tablet Take 1 tablet (5 mg total) by mouth every 4 (four) hours as needed for severe pain (pain score 7-10). 15 tablet 0   pantoprazole  (PROTONIX ) 40 MG tablet Take 1 tablet (40 mg total) by mouth 2 (two) times daily. 60 tablet 0   sertraline  (ZOLOFT ) 25 MG tablet Take 1 tablet (25 mg total) by mouth daily. 30 tablet 0   sodium  bicarbonate 650 MG tablet Take 2 tablets (1,300 mg total) by mouth 2 (two) times daily. 120 tablet 0   torsemide  (DEMADEX ) 100 MG tablet Take 1 tablet (100 mg total) by mouth daily. 30 tablet 0   traZODone  (DESYREL ) 50 MG tablet Take 1 tablet (50 mg total) by mouth at bedtime as needed for sleep. 30 tablet 0   TRELEGY ELLIPTA  100-62.5-25 MCG/ACT AEPB Inhale 1 puff into the lungs daily.      ALLERGIES:   Allergies  Allergen Reactions   Zestril  [Lisinopril ] Other (See Comments)    Hyperkalemia    FAM HX: Family History  Problem Relation Age of Onset   Diabetes Father  Colon cancer Neg Hx    Esophageal cancer Neg Hx    Rectal cancer Neg Hx    Stomach cancer Neg Hx     Social History:   reports that she has been smoking cigarettes. She has been exposed to tobacco smoke. She has never used smokeless tobacco. She reports that she does not currently use alcohol . She reports current drug use. Drug: Marijuana.  ROS: 12 system ROS per HPI above  Blood pressure (!) 143/72, pulse 83, temperature 98.3 F (36.8 C), temperature source Oral, resp. rate 16, SpO2 100%. PHYSICAL EXAM: Gen: chronically ill thin woman  Eyes: EOMI ENT: MMM Neck: supple, no JVD CV: RRR no rub Abd:  TTP epigastrium, no rebound or guarding Lungs a few rhonchi, no rales Extr:  no edema, RUE BC AVF +t/b Neuro: no asterixis, conversant    Results for orders placed or performed during the hospital encounter of 07/23/23 (from the past 48 hours)  Lipase, blood     Status: None   Collection Time: 07/23/23  5:40 PM  Result Value Ref Range   Lipase 42 11 - 51 U/L    Comment: Performed at [facility], [address] [place], [place] [address]  Comprehensive metabolic panel     Status: Abnormal   Collection Time: 07/23/23  5:40 PM  Result Value Ref Range   Sodium 134 (L) 135 - 145 mmol/L   Potassium 4.1 3.5 - 5.1 mmol/L   Chloride 102 98 - 111 mmol/L   CO2 14 (L) 22 - 32 mmol/L   Glucose, Bld 102 (H) 70  - 99 mg/dL    Comment: Glucose reference range applies only to samples taken after fasting for at least 8 hours.   BUN 72 (H) 8 - 23 mg/dL   Creatinine, Ser 6.04 (H) 0.44 - 1.00 mg/dL   Calcium  8.8 (L) 8.9 - 10.3 mg/dL   Total Protein 7.0 6.5 - 8.1 g/dL   Albumin  3.2 (L) 3.5 - 5.0 g/dL   AST 18 15 - 41 U/L   ALT 19 0 - 44 U/L   Alkaline Phosphatase 133 (H) 38 - 126 U/L   Total Bilirubin 0.5 0.0 - 1.2 mg/dL   GFR, Estimated 9 (L) >60 mL/min    Comment: (NOTE) Calculated using the CKD-EPI Creatinine Equation (2021)    Anion gap 18 (H) 5 - 15    Comment: Performed at [facility], [address], [place], [place] [address]  CBC     Status: Abnormal   Collection Time: 07/23/23  5:40 PM  Result Value Ref Range   WBC 5.2 4.0 - 10.5 K/uL   RBC 3.60 (L) 3.87 - 5.11 MIL/uL   Hemoglobin 11.5 (L) 12.0 - 15.0 g/dL   HCT 11.9 (L) 14.7 - 82.9 %   MCV 98.9 80.0 - 100.0 fL   MCH 31.9 26.0 - 34.0 pg   MCHC 32.3 30.0 - 36.0 g/dL   RDW 56.2 (H) 13.0 - 86.5 %   Platelets 357 150 - 400 K/uL   nRBC 0.0 0.0 - 0.2 %    Comment: Performed at [facility], [address], [place], [place] [address]  I-stat chem 8, ED (not at [facility], [facility] or [facility])     Status: Abnormal   Collection Time: 07/23/23  5:57 PM  Result Value Ref Range   Sodium 134 (L) 135 - 145 mmol/L   Potassium 4.3 3.5 - 5.1 mmol/L   Chloride 107 98 -  111 mmol/L   BUN 64 (H) 8 - 23 mg/dL   Creatinine, Ser 5.40 (H) 0.44 - 1.00 mg/dL   Glucose, Bld 981 (H) 70 - 99 mg/dL    Comment: Glucose reference range applies only to samples taken after fasting for at least 8 hours.   Calcium , Ion 1.06 (L) 1.15 - 1.40 mmol/L   TCO2 17 (L) 22 - 32 mmol/L   Hemoglobin 11.9 (L) 12.0 - 15.0 g/dL   HCT 19.1 (L) 47.8 - 29.5 %  Troponin I (High Sensitivity)     Status: Abnormal   Collection Time: 07/23/23  6:25 PM  Result Value Ref Range   Troponin I (High Sensitivity) 26 (H) <18 ng/L    Comment: (NOTE) Elevated high sensitivity troponin I  (hsTnI) values and significant  changes across serial measurements may suggest ACS but many other  chronic and acute conditions are known to elevate hsTnI results.  Refer to the "Links" section for chest pain algorithms and additional  guidance. Performed at [facility], [address] [place], [place] [address]   Troponin I (High Sensitivity)     Status: Abnormal   Collection Time: 07/23/23  8:57 PM  Result Value Ref Range   Troponin I (High Sensitivity) 25 (H) <18 ng/L    Comment: (NOTE) Elevated high sensitivity troponin I (hsTnI) values and significant  changes across serial measurements may suggest ACS but many other  chronic and acute conditions are known to elevate hsTnI results.  Refer to the "Links" section for chest pain algorithms and additional  guidance. Performed at [facility], [address], [place], [place] [address]   I-Stat venous blood gas, [facility] ED, [facility], [facility])     Status: Abnormal   Collection Time: 07/23/23 10:49 PM  Result Value Ref Range   pH, Ven 7.452 (H) 7.25 - 7.43   pCO2, Ven 16.0 (LL) 44 - 60 mmHg   pO2, Ven 160 (H) 32 - 45 mmHg   Bicarbonate 11.1 (L) 20.0 - 28.0 mmol/L   TCO2 12 (L) 22 - 32 mmol/L   O2 Saturation 100 %   Acid-base deficit 11.0 (H) 0.0 - 2.0 mmol/L   Sodium 132 (L) 135 - 145 mmol/L   Potassium 4.1 3.5 - 5.1 mmol/L   Calcium , Ion 0.85 (LL) 1.15 - 1.40 mmol/L   HCT 32.0 (L) 36.0 - 46.0 %   Hemoglobin 10.9 (L) 12.0 - 15.0 g/dL   Sample type VENOUS    Comment NOTIFIED PHYSICIAN   MRSA Next Gen by PCR, Nasal     Status: Abnormal   Collection Time: 07/24/23  4:00 AM   Specimen: Nasal Mucosa; Nasal Swab  Result Value Ref Range   MRSA by PCR Next Gen DETECTED (A) NOT DETECTED    Comment: RESULT CALLED TO, READ BACK BY AND VERIFIED WITH: T ROSS RN 07/24/2023 @ 0554 BY AB (NOTE) The GeneXpert MRSA Assay (FDA approved for NASAL specimens only), is one component of a comprehensive MRSA colonization surveillance program. It  is not intended to diagnose MRSA infection nor to guide or monitor treatment for MRSA infections. Test performance is not FDA approved in patients less than 59 years old. Performed at [facility], [address], [place], [place] [address]   CBC     Status: Abnormal   Collection Time: 07/24/23  6:02 AM  Result Value Ref Range   WBC 4.7 4.0 - 10.5 K/uL   RBC 3.05 (L) 3.87 - 5.11 MIL/uL  Hemoglobin 9.7 (L) 12.0 - 15.0 g/dL   HCT 82.9 (L) 56.2 - 13.0 %   MCV 95.4 80.0 - 100.0 fL   MCH 31.8 26.0 - 34.0 pg   MCHC 33.3 30.0 - 36.0 g/dL   RDW 86.5 (H) 78.4 - 69.6 %   Platelets 311 150 - 400 K/uL   nRBC 0.0 0.0 - 0.2 %    Comment: Performed at [facility], [address], [place], [place] [address]  Basic metabolic panel     Status: Abnormal   Collection Time: 07/24/23  6:02 AM  Result Value Ref Range   Sodium 132 (L) 135 - 145 mmol/L   Potassium 4.1 3.5 - 5.1 mmol/L   Chloride 104 98 - 111 mmol/L   CO2 14 (L) 22 - 32 mmol/L   Glucose, Bld 99 70 - 99 mg/dL    Comment: Glucose reference range applies only to samples taken after fasting for at least 8 hours.   BUN 63 (H) 8 - 23 mg/dL   Creatinine, Ser 4.13 (H) 0.44 - 1.00 mg/dL   Calcium  8.4 (L) 8.9 - 10.3 mg/dL   GFR, Estimated 10 (L) >60 mL/min    Comment: (NOTE) Calculated using the CKD-EPI Creatinine Equation (2021)    Anion gap 14 5 - 15    Comment: Performed at [facility], [address] [place], [place] [address]    CT ABDOMEN PELVIS WO CONTRAST Result Date: 07/23/2023 CLINICAL DATA:  Epigastric pain for several days, dark bowel movement EXAM: CT ABDOMEN AND PELVIS WITHOUT CONTRAST TECHNIQUE: Multidetector CT imaging of the abdomen and pelvis was performed following the standard protocol without IV contrast. RADIATION DOSE REDUCTION: This exam was performed according to the departmental dose-optimization program which includes automated exposure control, adjustment of the mA and/or kV according to patient  size and/or use of iterative reconstruction technique. COMPARISON:  10/25/2022 FINDINGS: Lower chest: Left lung base is clear. Right-sided pleural effusion is noted with some associated atelectatic changes. Hepatobiliary: No focal liver abnormality is seen. No gallstones, gallbladder wall thickening, or biliary dilatation. Mild free perihepatic fluid is noted. Pancreas: Unremarkable. No pancreatic ductal dilatation or surrounding inflammatory changes. Spleen: Normal in size without focal abnormality. Adrenals/Urinary Tract: Adrenal glands are within normal limits. No renal calculi or urinary tract obstructive changes are noted. Now rotation is noted in the right kidney although stable from the prior exam. Ureters are within normal limits. Bladder is well distended. Stomach/Bowel: Scattered diverticular change of the colon is noted without evidence of diverticulitis. Appendix is within normal limits. Stomach is within normal limits. Jejunum and ileum appear unremarkable. Wall thickening and inflammatory changes are noted in the second portion the duodenum consistent with focal duodenitis. No definitive ulcer is seen. No perforation is noted. Vascular/Lymphatic: Aortic atherosclerosis. No enlarged abdominal or pelvic lymph nodes. Reproductive: Uterus and bilateral adnexa are unremarkable. Other: Mild free fluid is noted in the pelvis and surrounding the liver. Musculoskeletal: No acute or significant osseous findings. IMPRESSION: Changes consistent with duodenitis in the second portion of the duodenum. No perforation is seen. Mild free fluid is noted within the abdomen and pelvis. Small right effusion with associated atelectatic changes. Electronically Signed   By: Violeta Grey M.D.   On: 07/23/2023 21:29   US  Abdomen Limited RUQ (LIVER/GB) Result Date: 07/23/2023 CLINICAL DATA:  Right upper quadrant abdominal pain. EXAM: ULTRASOUND ABDOMEN LIMITED RIGHT UPPER QUADRANT COMPARISON:  October 25, 2022.  November 15, 2021.  FINDINGS: Gallbladder: No cholelithiasis  is noted, but moderate wall thickening is noted at 5.5 mm which may be due to surrounding ascites or adjacent hepatocellular disease. Positive sonographic Murphy's sign is noted. Common bile duct: Diameter: 4 mm which is within normal limits. Liver: Nodular hepatic contours are noted suggesting possible hepatic cirrhosis. Increased echogenicity of hepatic parenchyma is noted suggesting hepatic steatosis. Portal vein is patent on color Doppler imaging with normal direction of blood flow towards the liver. Other: Mild ascites is noted. 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 Possible hepatic steatosis is noted as well. Mild ascites is noted. No definite cholelithiasis is noted. Moderate gallbladder wall thickening is noted most likely due to surrounding ascites or diffuse hepatocellular disease. Electronically Signed   By: Rosalene Colon M.D.   On: 07/23/2023 19:35   DG Chest Portable 1 View Result Date: 07/23/2023 CLINICAL DATA:  Shortness of breath. EXAM: PORTABLE CHEST 1 VIEW COMPARISON:  June 24, 2023. FINDINGS: Stable cardiomegaly. Left lung is clear. Small right pleural effusion is noted which is significantly decreased compared to prior exam. Minimal right basilar subsegmental atelectasis is noted. Bony thorax is unremarkable. IMPRESSION: Decreased right pleural effusion compared to prior exam with minimal right basilar atelectasis. Electronically Signed   By: Rosalene Colon M.D.   On: 07/23/2023 18:43    Assessment/Plan **Duodenitis: etiology of abd pain, not a uremic finding; per primary  **ESRD: no regular dialysis.  I see no uremic symptoms, volume issues or major electrolyte issues to be corrected with HD.  Additionally she declines tx today.  Will check in w her tomorrow to see if would benefit.   **HTN:  cont outpt meds  **Anemia of CKD:  Hb 9.7, will  given dose aranesp  while in and repeat iron  studies -  iron  sat 10% in 05/2023.  **Metabolic bone dz: corr ca normal, 4/15 phos 4.9.  on calcitriol .   **metabolic acidosis: VBG 7.45 / 16 - compensated, hasn't been very adherent to po bicarb, will also titrate to TID  [facility] for d/c from nephrology perspective, will follow.   Baron Border 07/24/2023, 11:04 AM

## 2023-07-24 NOTE — Progress Notes (Signed)
 Per report from the patient's day RN, the patient had one large maroon colored stool during the day, Wednesday.  At approximately 0230, the patient's wife ask RN to come to the patient's room.  The patient had a moderate size loose with some form portions of dark maroon colored stool.  VS were stable.  The patient had no complaints of nausea, dizziness, or feeling light headed.  Dr. Duncan Gibson made aware of the situation.  Will continue to monitor patient.  Necia Bali RN

## 2023-07-24 NOTE — Progress Notes (Signed)
 Patient refused to do total skin check, will continue to try asking again.

## 2023-07-24 NOTE — Progress Notes (Signed)
 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  This is associated with significant indigestion and heartburn symptoms.  She has had pain like this before with her ulcers.  She continues to smoke about a pack per day, no alcohol  use but does use marijuana as well as cocaine.  Imaging on admission was concerning for duodenitis.  She was admitted to the hospital  Subjective / 24h Interval events: Complains of persistent abdominal pain this morning.  No nausea or vomiting  Assesement and Plan: Principal Problem:   Duodenitis Active Problems:   ESRD on dialysis [facility])   Essential hypertension   Anemia of chronic disease   COPD (chronic obstructive pulmonary disease) (HCC)   Anxiety and depression   Hyperlipidemia   Homelessness   Polysubstance abuse (HCC)  Principal problem Duodenitis, prior upper GI bleed due to PUD/AVMs -patient was admitted to the hospital with worsening symptoms in terms of her duodenitis.  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  Last time she was dialyzed on April 14.  Patient tells me she can go up to 2 months without dialysis  Anemia-of chronic disease, concern for chronic GI bleed.  Hemoglobin stable, no frank bleeding, no need for  transfusions  Hyponatremia-in the setting of slight fluid overload  Polysubstance use-continues to smoke, marijuana, cocaine.  Counseled for cessation  Essential hypertension-continue amlodipine , Coreg   Depression/anxiety-continue BuSpar , Zoloft   Homelessness-noted, TOC consult  Scheduled Meds:  amLODipine   10 mg Oral Daily   atorvastatin   10 mg Oral Daily   budeson-glycopyrrolate -formoterol   2 puff Inhalation BID   busPIRone   10 mg Oral TID   carvedilol   6.25 mg Oral BID WC   gabapentin   100 mg Oral TID   nicotine   21 mg Transdermal Daily   pantoprazole  (PROTONIX ) IV  40 mg Intravenous Q12H   sertraline   25 mg Oral Daily   sodium bicarbonate   1,300 mg Oral BID   sucralfate   1 g Oral TID WC & HS   Continuous Infusions: PRN Meds:.acetaminophen  **OR** acetaminophen , albuterol , ondansetron  **OR** ondansetron  (ZOFRAN ) IV, senna-docusate, traZODone   Current Outpatient Medications  Medication Instructions   acetaminophen  (TYLENOL ) 650 mg, Oral, Every 6 hours PRN   albuterol  (VENTOLIN  HFA) 108 (90 Base) MCG/ACT inhaler 2 puffs, Inhalation, Every 6 hours PRN   amLODipine  (NORVASC ) 10 mg, Oral, Daily   atorvastatin  (LIPITOR) 10 mg, Oral, Daily   benzonatate  (TESSALON ) 100 mg, Oral, 3 times daily   budeson-glycopyrrolate -formoterol  (BREZTRI ) 160-9-4.8 MCG/ACT AERO inhaler 2 puffs, Inhalation, 2 times daily   busPIRone  (BUSPAR ) 10 mg, Oral, 3 times daily   calcitRIOL  (ROCALTROL ) 0.25 mcg, Oral, Daily   carvedilol  (COREG ) 6.25 mg, Oral, 2 times daily with meals   FeroSul 325 mg, Oral, Daily with breakfast   gabapentin  (NEURONTIN ) 100 mg, Oral, 3 times daily   oxyCODONE  (OXY IR/ROXICODONE ) 5 mg, Oral, Every  4 hours PRN   pantoprazole  (PROTONIX ) 40 mg, Oral, 2 times daily   sertraline  (ZOLOFT ) 25 mg, Oral, Daily   sodium bicarbonate  1,300 mg, Oral, 2 times daily   torsemide  (DEMADEX ) 100 mg, Oral, Daily   traZODone  (DESYREL ) 50 mg, Oral, At bedtime PRN   TRELEGY ELLIPTA  100-62.5-25  MCG/ACT AEPB 1 puff, Daily    Diet Orders (From admission, onward)     Start     Ordered   07/24/23 0007  DIET SOFT Room service appropriate? Yes; Fluid consistency: Thin  Diet effective now       Question Answer Comment  Room service appropriate? Yes   Fluid consistency: Thin      07/24/23 0006            DVT prophylaxis: SCDs Start: 07/24/23 0006   Lab Results  Component Value Date   PLT 311 07/24/2023      Code Status: Do not attempt resuscitation (DNR) PRE-ARREST INTERVENTIONS DESIRED  Family Communication: no family at bedside  Status is: Observation The patient will require care spanning > 2 midnights and should be moved to inpatient because: persistent pain  Level of care: Med-Surg  Consultants:  Nephrology   Objective: Vitals:   07/24/23 0000 07/24/23 0043 07/24/23 0456 07/24/23 0820  BP: (!) 172/74 (!) 175/91 134/68 (!) 143/72  Pulse: 67 77 87 83  Resp: 12 15 15 16   Temp:  98.3 F (36.8 C) 98.5 F (36.9 C) 98.3 F (36.8 C)  TempSrc:  Oral Oral Oral  SpO2: 100% 100% 99% 100%    Intake/Output Summary (Last 24 hours) at 07/24/2023 0932 Last data filed at 07/24/2023 0600 Gross per 24 hour  Intake 360 ml  Output 0 ml  Net 360 ml   Wt Readings from Last 3 Encounters:  06/30/23 56.1 kg  06/16/23 54.9 kg  06/11/23 54 kg    Examination:  Constitutional: NAD Eyes: no scleral icterus ENMT: Mucous membranes are moist.  Neck: normal, supple Respiratory: clear to auscultation bilaterally, no wheezing, no crackles.  Cardiovascular: Regular rate and rhythm, no murmurs / rubs / gallops.  Abdomen: non distended, no tenderness. Bowel sounds positive.  Musculoskeletal: no clubbing / cyanosis.   Data Reviewed: I have independently reviewed following labs and imaging studies   CBC Recent Labs  Lab 07/23/23 1740 07/23/23 1757 07/23/23 2249 07/24/23 0602  WBC 5.2  --   --  4.7  HGB 11.5* 11.9* 10.9* 9.7*  HCT 35.6* 35.0* 32.0* 29.1*  PLT 357  --    --  311  MCV 98.9  --   --  95.4  MCH 31.9  --   --  31.8  MCHC 32.3  --   --  33.3  RDW 19.1*  --   --  19.2*    Recent Labs  Lab 07/23/23 1740 07/23/23 1757 07/23/23 2249 07/24/23 0602  NA 134* 134* 132* 132*  K 4.1 4.3 4.1 4.1  CL 102 107  --  104  CO2 14*  --   --  14*  GLUCOSE 102* 100*  --  99  BUN 72* 64*  --  63*  CREATININE 4.97* 5.20*  --  4.77*  CALCIUM  8.8*  --   --  8.4*  AST 18  --   --   --   ALT 19  --   --   --   ALKPHOS 133*  --   --   --   BILITOT 0.5  --   --   --  ALBUMIN  3.2*  --   --   --     ------------------------------------------------------------------------------------------------------------------ No results for input(s): "CHOL", "HDL", "LDLCALC", "TRIG", "CHOLHDL", "LDLDIRECT" in the last 72 hours.  Lab Results  Component Value Date   HGBA1C 5.0 05/16/2023   ------------------------------------------------------------------------------------------------------------------ No results for input(s): "TSH", "T4TOTAL", "T3FREE", "THYROIDAB" in the last 72 hours.  Invalid input(s): "FREET3"  Cardiac Enzymes No results for input(s): "CKMB", "TROPONINI", "MYOGLOBIN" in the last 168 hours.  Invalid input(s): "CK" ------------------------------------------------------------------------------------------------------------------    Component Value Date/Time   BNP 1,242.2 (H) 06/24/2023 1410    CBG: No results for input(s): "GLUCAP" in the last 168 hours.  Recent Results (from the past 240 hours)  MRSA Next Gen by PCR, Nasal     Status: Abnormal   Collection Time: 07/24/23  4:00 AM   Specimen: Nasal Mucosa; Nasal Swab  Result Value Ref Range Status   MRSA by PCR Next Gen DETECTED (A) NOT DETECTED Final    Comment: RESULT CALLED TO, READ BACK BY AND VERIFIED WITH: T ROSS RN 07/24/2023 @ 0554 BY AB (NOTE) The GeneXpert MRSA Assay (FDA approved for NASAL specimens only), is one component of a comprehensive MRSA colonization  surveillance program. It is not intended to diagnose MRSA infection nor to guide or monitor treatment for MRSA infections. Test performance is not FDA approved in patients less than 42 years old. Performed at [facility], [address], [place], [place] [address]      Radiology Studies: CT ABDOMEN PELVIS WO CONTRAST Result Date: 07/23/2023 CLINICAL DATA:  Epigastric pain for several days, dark bowel movement EXAM: CT ABDOMEN AND PELVIS WITHOUT CONTRAST TECHNIQUE: Multidetector CT imaging of the abdomen and pelvis was performed following the standard protocol without IV contrast. RADIATION DOSE REDUCTION: This exam was performed according to the departmental dose-optimization program which includes automated exposure control, adjustment of the mA and/or kV according to patient size and/or use of iterative reconstruction technique. COMPARISON:  10/25/2022 FINDINGS: Lower chest: Left lung base is clear. Right-sided pleural effusion is noted with some associated atelectatic changes. Hepatobiliary: No focal liver abnormality is seen. No gallstones, gallbladder wall thickening, or biliary dilatation. Mild free perihepatic fluid is noted. Pancreas: Unremarkable. No pancreatic ductal dilatation or surrounding inflammatory changes. Spleen: Normal in size without focal abnormality. Adrenals/Urinary Tract: Adrenal glands are within normal limits. No renal calculi or urinary tract obstructive changes are noted. Now rotation is noted in the right kidney although stable from the prior exam. Ureters are within normal limits. Bladder is well distended. Stomach/Bowel: Scattered diverticular change of the colon is noted without evidence of diverticulitis. Appendix is within normal limits. Stomach is within normal limits. Jejunum and ileum appear unremarkable. Wall thickening and inflammatory changes are noted in the second portion the duodenum consistent with focal duodenitis. No definitive ulcer is seen. No  perforation is noted. Vascular/Lymphatic: Aortic atherosclerosis. No enlarged abdominal or pelvic lymph nodes. Reproductive: Uterus and bilateral adnexa are unremarkable. Other: Mild free fluid is noted in the pelvis and surrounding the liver. Musculoskeletal: No acute or significant osseous findings. IMPRESSION: Changes consistent with duodenitis in the second portion of the duodenum. No perforation is seen. Mild free fluid is noted within the abdomen and pelvis. Small right effusion with associated atelectatic changes. Electronically Signed   By: Violeta Grey M.D.   On: 07/23/2023 21:29   US  Abdomen Limited RUQ (LIVER/GB) Result Date: 07/23/2023 CLINICAL DATA:  Right upper quadrant abdominal pain. EXAM: ULTRASOUND ABDOMEN LIMITED RIGHT UPPER QUADRANT COMPARISON:  October 25, 2022.  November 15, 2021. FINDINGS: Gallbladder: No cholelithiasis is noted, but moderate wall thickening is noted at 5.5 mm which may be due to surrounding ascites or adjacent hepatocellular disease. Positive sonographic Murphy's sign is noted. Common bile duct: Diameter: 4 mm which is within normal limits. Liver: Nodular hepatic contours are noted suggesting possible hepatic cirrhosis. Increased echogenicity of hepatic parenchyma is noted suggesting hepatic steatosis. Portal vein is patent on color Doppler imaging with normal direction of blood flow towards the liver. Other: Mild ascites is noted. 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 Possible hepatic steatosis is noted as well. Mild ascites is noted. No definite cholelithiasis is noted. Moderate gallbladder wall thickening is noted most likely due to surrounding ascites or diffuse hepatocellular disease. Electronically Signed   By: Rosalene Colon M.D.   On: 07/23/2023 19:35   DG Chest Portable 1 View Result Date: 07/23/2023 CLINICAL DATA:  Shortness of breath. EXAM: PORTABLE CHEST 1 VIEW COMPARISON:  June 24, 2023. FINDINGS: Stable  cardiomegaly. Left lung is clear. Small right pleural effusion is noted which is significantly decreased compared to prior exam. Minimal right basilar subsegmental atelectasis is noted. Bony thorax is unremarkable. IMPRESSION: Decreased right pleural effusion compared to prior exam with minimal right basilar atelectasis. Electronically Signed   By: Rosalene Colon M.D.   On: 07/23/2023 18:43     Kathlen Para, MD, PhD [facility]  Between 7 am - 7 pm I am available, please contact me via Amion (for emergencies) or Securechat (non urgent messages)  Between 7 pm - 7 am I am not available, please contact night coverage MD/APP via Amion

## 2023-07-24 NOTE — Hospital Course (Signed)
 [name] is a 63 y.o. female with medical history significant for ESRD not on scheduled HD, COPD, HTN, UGI bleed due to AVMs with chronic gastritis/duodenitis, anemia of CKD and chronic blood loss, depression/anxiety, chronic pain, polysubstance use, medical nonadherence, and homelessness who is admitted with duodenitis.

## 2023-07-25 DIAGNOSIS — F17210 Nicotine dependence, cigarettes, uncomplicated: Secondary | ICD-10-CM | POA: Diagnosis present

## 2023-07-25 DIAGNOSIS — Z79899 Other long term (current) drug therapy: Secondary | ICD-10-CM | POA: Diagnosis not present

## 2023-07-25 DIAGNOSIS — E785 Hyperlipidemia, unspecified: Secondary | ICD-10-CM | POA: Diagnosis present

## 2023-07-25 DIAGNOSIS — E1122 Type 2 diabetes mellitus with diabetic chronic kidney disease: Secondary | ICD-10-CM | POA: Diagnosis present

## 2023-07-25 DIAGNOSIS — Z7951 Long term (current) use of inhaled steroids: Secondary | ICD-10-CM | POA: Diagnosis not present

## 2023-07-25 DIAGNOSIS — D500 Iron deficiency anemia secondary to blood loss (chronic): Secondary | ICD-10-CM | POA: Diagnosis present

## 2023-07-25 DIAGNOSIS — Z5900 Homelessness unspecified: Secondary | ICD-10-CM | POA: Diagnosis not present

## 2023-07-25 DIAGNOSIS — D631 Anemia in chronic kidney disease: Secondary | ICD-10-CM | POA: Diagnosis present

## 2023-07-25 DIAGNOSIS — E872 Acidosis, unspecified: Secondary | ICD-10-CM | POA: Diagnosis present

## 2023-07-25 DIAGNOSIS — E871 Hypo-osmolality and hyponatremia: Secondary | ICD-10-CM | POA: Diagnosis present

## 2023-07-25 DIAGNOSIS — J4489 Other specified chronic obstructive pulmonary disease: Secondary | ICD-10-CM | POA: Diagnosis present

## 2023-07-25 DIAGNOSIS — Z91158 Patient's noncompliance with renal dialysis for other reason: Secondary | ICD-10-CM | POA: Diagnosis not present

## 2023-07-25 DIAGNOSIS — Z992 Dependence on renal dialysis: Secondary | ICD-10-CM | POA: Diagnosis not present

## 2023-07-25 DIAGNOSIS — K5900 Constipation, unspecified: Secondary | ICD-10-CM | POA: Diagnosis present

## 2023-07-25 DIAGNOSIS — Z8616 Personal history of COVID-19: Secondary | ICD-10-CM | POA: Diagnosis not present

## 2023-07-25 DIAGNOSIS — F32A Depression, unspecified: Secondary | ICD-10-CM | POA: Diagnosis present

## 2023-07-25 DIAGNOSIS — K2980 Duodenitis without bleeding: Secondary | ICD-10-CM | POA: Diagnosis present

## 2023-07-25 DIAGNOSIS — K31819 Angiodysplasia of stomach and duodenum without bleeding: Secondary | ICD-10-CM | POA: Diagnosis present

## 2023-07-25 DIAGNOSIS — Z66 Do not resuscitate: Secondary | ICD-10-CM | POA: Diagnosis present

## 2023-07-25 DIAGNOSIS — Z833 Family history of diabetes mellitus: Secondary | ICD-10-CM | POA: Diagnosis not present

## 2023-07-25 DIAGNOSIS — I120 Hypertensive chronic kidney disease with stage 5 chronic kidney disease or end stage renal disease: Secondary | ICD-10-CM | POA: Diagnosis present

## 2023-07-25 DIAGNOSIS — I160 Hypertensive urgency: Secondary | ICD-10-CM | POA: Diagnosis present

## 2023-07-25 DIAGNOSIS — F1910 Other psychoactive substance abuse, uncomplicated: Secondary | ICD-10-CM | POA: Diagnosis present

## 2023-07-25 DIAGNOSIS — N186 End stage renal disease: Secondary | ICD-10-CM | POA: Diagnosis present

## 2023-07-25 LAB — CBC
HCT: 25.8 % — ABNORMAL LOW (ref 36.0–46.0)
Hemoglobin: 8.5 g/dL — ABNORMAL LOW (ref 12.0–15.0)
MCH: 31.7 pg (ref 26.0–34.0)
MCHC: 32.9 g/dL (ref 30.0–36.0)
MCV: 96.3 fL (ref 80.0–100.0)
Platelets: 274 10*3/uL (ref 150–400)
RBC: 2.68 MIL/uL — ABNORMAL LOW (ref 3.87–5.11)
RDW: 19.1 % — ABNORMAL HIGH (ref 11.5–15.5)
WBC: 4.6 10*3/uL (ref 4.0–10.5)
nRBC: 0 % (ref 0.0–0.2)

## 2023-07-25 LAB — COMPREHENSIVE METABOLIC PANEL WITH GFR
ALT: 18 U/L (ref 0–44)
AST: 28 U/L (ref 15–41)
Albumin: 2.6 g/dL — ABNORMAL LOW (ref 3.5–5.0)
Alkaline Phosphatase: 113 U/L (ref 38–126)
Anion gap: 15 (ref 5–15)
BUN: 66 mg/dL — ABNORMAL HIGH (ref 8–23)
CO2: 17 mmol/L — ABNORMAL LOW (ref 22–32)
Calcium: 8.3 mg/dL — ABNORMAL LOW (ref 8.9–10.3)
Chloride: 100 mmol/L (ref 98–111)
Creatinine, Ser: 5.3 mg/dL — ABNORMAL HIGH (ref 0.44–1.00)
GFR, Estimated: 9 mL/min — ABNORMAL LOW (ref >60)
Glucose, Bld: 89 mg/dL (ref 70–99)
Potassium: 4.4 mmol/L (ref 3.5–5.1)
Sodium: 132 mmol/L — ABNORMAL LOW (ref 135–145)
Total Bilirubin: 0.5 mg/dL (ref 0.0–1.2)
Total Protein: 5.7 g/dL — ABNORMAL LOW (ref 6.5–8.1)

## 2023-07-25 LAB — IRON AND TIBC
Iron: 75 ug/dL (ref 28–170)
Saturation Ratios: 32 % — ABNORMAL HIGH (ref 10.4–31.8)
TIBC: 232 ug/dL — ABNORMAL LOW (ref 250–450)
UIBC: 157 ug/dL

## 2023-07-25 LAB — MAGNESIUM: Magnesium: 2.1 mg/dL (ref 1.7–2.4)

## 2023-07-25 MED ORDER — MUPIROCIN 2 % EX OINT
1.0000 | TOPICAL_OINTMENT | Freq: Two times a day (BID) | CUTANEOUS | Status: DC
Start: 2023-07-25 — End: 2023-07-30
  Administered 2023-07-25 – 2023-07-26 (×3): 1 via NASAL
  Filled 2023-07-25 (×2): qty 22

## 2023-07-25 MED ORDER — CHLORHEXIDINE GLUCONATE CLOTH 2 % EX PADS
6.0000 | MEDICATED_PAD | Freq: Every day | CUTANEOUS | Status: DC
  Administered 2023-07-25 – 2023-07-26 (×2): 6 via TOPICAL

## 2023-07-25 MED ORDER — SODIUM CHLORIDE (PF) 0.9 % IJ SOLN
INTRAMUSCULAR | Status: AC
  Administered 2023-07-25: 10 mL
  Filled 2023-07-25: qty 10

## 2023-07-25 MED ORDER — GLYCERIN (LAXATIVE) 2 G RE SUPP
1.0000 | Freq: Once | RECTAL | Status: AC
  Administered 2023-07-25: 1 via RECTAL
  Filled 2023-07-25: qty 1

## 2023-07-25 NOTE — Progress Notes (Signed)
 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  This is associated with significant indigestion and heartburn symptoms.  She has had pain like this before with her ulcers.  She continues to smoke about a pack per day, no alcohol  use but does use marijuana as well as cocaine.  Imaging on admission was concerning for duodenitis.  She was admitted to the hospital  Subjective / 24h Interval events: Continue to have abdominal pain, she is also constipated today.  Does have black stools however she is taking iron , has not noticed any frank bleeding  Assesement and Plan: Principal Problem:   Duodenitis Active Problems:   ESRD on dialysis [facility])   Essential hypertension   Anemia of chronic disease   COPD (chronic obstructive pulmonary disease) (HCC)   Anxiety and depression   Hyperlipidemia   Homelessness   Polysubstance abuse (HCC)  Principal problem Duodenitis, prior upper GI bleed due to PUD/AVMs -patient was admitted to the hospital with worsening symptoms in terms of her duodenitis.  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  Last time she was dialyzed on April 14.  Patient  tells me she can go up to 2 months without dialysis - No indication for dialysis  Anemia-of chronic disease, concern for chronic GI bleed.  Hemoglobin stable but trending down.  Watch for 1 more day  Hyponatremia-in the setting of slight fluid overload  Polysubstance use-continues to smoke, marijuana, cocaine.  Counseled for cessation  Essential hypertension-continue amlodipine , Coreg   Depression/anxiety-continue BuSpar , Zoloft   Homelessness-noted, TOC consult  Scheduled Meds:  amLODipine   10 mg Oral Daily   atorvastatin   10 mg Oral Daily   budeson-glycopyrrolate -formoterol   2 puff Inhalation BID   busPIRone   10 mg Oral TID   carvedilol   6.25 mg Oral BID WC   Chlorhexidine  Gluconate Cloth  6 each Topical Daily   gabapentin   100 mg Oral TID   Glycerin (Adult)  1 suppository Rectal Once   mupirocin  ointment  1 Application Nasal BID   nicotine   21 mg Transdermal Daily   pantoprazole  (PROTONIX ) IV  40 mg Intravenous Q12H   sertraline   25 mg Oral Daily   sodium bicarbonate   1,300 mg Oral TID   sucralfate   1 g Oral TID WC & HS   Continuous Infusions: PRN Meds:.acetaminophen  **OR** acetaminophen , albuterol , ondansetron  **OR** ondansetron  (ZOFRAN ) IV, mouth rinse, oxyCODONE , senna-docusate, traZODone   Current Outpatient Medications  Medication Instructions   acetaminophen  (TYLENOL ) 650 mg, Oral, Every 6 hours PRN   albuterol  (VENTOLIN  HFA) 108 (90 Base) MCG/ACT inhaler 2 puffs, Inhalation, Every 6 hours PRN   amLODipine  (NORVASC ) 10 mg, Oral, Daily   atorvastatin  (LIPITOR) 10 mg, Oral, Daily   benzonatate  (TESSALON ) 100 mg, Oral, 3 times daily   budeson-glycopyrrolate -formoterol  (BREZTRI )  160-9-4.8 MCG/ACT AERO inhaler 2 puffs, Inhalation, 2 times daily   busPIRone  (BUSPAR ) 10 mg, Oral, 3 times daily   calcitRIOL  (ROCALTROL ) 0.25 mcg, Oral, Daily   carvedilol  (COREG ) 6.25 mg, Oral, 2 times daily with meals   FeroSul 325 mg, Oral, Daily with breakfast   gabapentin  (NEURONTIN ) 100  mg, Oral, 3 times daily   oxyCODONE  (OXY IR/ROXICODONE ) 5 mg, Oral, Every 4 hours PRN   pantoprazole  (PROTONIX ) 40 mg, Oral, 2 times daily   sertraline  (ZOLOFT ) 25 mg, Oral, Daily   sodium bicarbonate  1,300 mg, Oral, 2 times daily   torsemide  (DEMADEX ) 100 mg, Oral, Daily   traZODone  (DESYREL ) 50 mg, Oral, At bedtime PRN   TRELEGY ELLIPTA  100-62.5-25 MCG/ACT AEPB 1 puff, Daily    Diet Orders (From admission, onward)     Start     Ordered   07/24/23 0007  DIET SOFT Room service appropriate? Yes; Fluid consistency: Thin  Diet effective now       Question Answer Comment  Room service appropriate? Yes   Fluid consistency: Thin      07/24/23 0006            DVT prophylaxis: SCDs Start: 07/24/23 0006   Lab Results  Component Value Date   PLT 274 07/25/2023      Code Status: Do not attempt resuscitation (DNR) PRE-ARREST INTERVENTIONS DESIRED  Family Communication: no family at bedside  Status is: Observation The patient will require care spanning > 2 midnights and should be moved to inpatient because: persistent pain  Level of care: Med-Surg  Consultants:  Nephrology   Objective: Vitals:   07/24/23 1938 07/25/23 0529 07/25/23 0821 07/25/23 0949  BP:  (!) 148/70  (!) 171/78  Pulse:  81  85  Resp:  18  20  Temp:  98.3 F (36.8 C)  98 F (36.7 C)  TempSrc:  Oral  Oral  SpO2: 98% 99% 99% 100%   No intake or output data in the 24 hours ending 07/25/23 1015  Wt Readings from Last 3 Encounters:  06/30/23 56.1 kg  06/16/23 54.9 kg  06/11/23 54 kg    Examination:  Constitutional: NAD Eyes: lids and conjunctivae normal, no scleral icterus ENMT: mmm Neck: normal, supple Respiratory: clear to auscultation bilaterally, no wheezing, no crackles. Normal respiratory effort.  Cardiovascular: Regular rate and rhythm, no murmurs / rubs / gallops. No LE edema. Abdomen: soft, no distention, no tenderness. Bowel sounds positive.   Data Reviewed: I have independently  reviewed following labs and imaging studies   CBC Recent Labs  Lab 07/23/23 1740 07/23/23 1757 07/23/23 2249 07/24/23 0602 07/25/23 0653  WBC 5.2  --   --  4.7 4.6  HGB 11.5* 11.9* 10.9* 9.7* 8.5*  HCT 35.6* 35.0* 32.0* 29.1* 25.8*  PLT 357  --   --  311 274  MCV 98.9  --   --  95.4 96.3  MCH 31.9  --   --  31.8 31.7  MCHC 32.3  --   --  33.3 32.9  RDW 19.1*  --   --  19.2* 19.1*    Recent Labs  Lab 07/23/23 1740 07/23/23 1757 07/23/23 2249 07/24/23 0602 07/25/23 0653  NA 134* 134* 132* 132* 132*  K 4.1 4.3 4.1 4.1 4.4  CL 102 107  --  104 100  CO2 14*  --   --  14* 17*  GLUCOSE 102* 100*  --  99 89  BUN 72* 64*  --  63* 66*  CREATININE 4.97* 5.20*  --  4.77* 5.30*  CALCIUM  8.8*  --   --  8.4* 8.3*  AST 18  --   --   --  28  ALT 19  --   --   --  18  ALKPHOS 133*  --   --   --  113  BILITOT 0.5  --   --   --  0.5  ALBUMIN  3.2*  --   --   --  2.6*  MG  --   --   --   --  2.1    ------------------------------------------------------------------------------------------------------------------ No results for input(s): "CHOL", "HDL", "LDLCALC", "TRIG", "CHOLHDL", "LDLDIRECT" in the last 72 hours.  Lab Results  Component Value Date   HGBA1C 5.0 05/16/2023   ------------------------------------------------------------------------------------------------------------------ No results for input(s): "TSH", "T4TOTAL", "T3FREE", "THYROIDAB" in the last 72 hours.  Invalid input(s): "FREET3"  Cardiac Enzymes No results for input(s): "CKMB", "TROPONINI", "MYOGLOBIN" in the last 168 hours.  Invalid input(s): "CK" ------------------------------------------------------------------------------------------------------------------    Component Value Date/Time   BNP 1,242.2 (H) 06/24/2023 1410    CBG: Recent Labs  Lab 07/24/23 1148  GLUCAP 104*    Recent Results (from the past 240 hours)  MRSA Next Gen by PCR, Nasal     Status: Abnormal   Collection Time: 07/24/23   4:00 AM   Specimen: Nasal Mucosa; Nasal Swab  Result Value Ref Range Status   MRSA by PCR Next Gen DETECTED (A) NOT DETECTED Final    Comment: RESULT CALLED TO, READ BACK BY AND VERIFIED WITH: T ROSS RN 07/24/2023 @ 0554 BY AB (NOTE) The GeneXpert MRSA Assay (FDA approved for NASAL specimens only), is one component of a comprehensive MRSA colonization surveillance program. It is not intended to diagnose MRSA infection nor to guide or monitor treatment for MRSA infections. Test performance is not FDA approved in patients less than 61 years old. Performed at [facility], [address], [place], [place] [address]      Radiology Studies: No results found.    Kathlen Para, MD, PhD [facility]  Between 7 am - 7 pm I am available, please contact me via Amion (for emergencies) or Securechat (non urgent messages)  Between 7 pm - 7 am I am not available, please contact night coverage MD/APP via Amion

## 2023-07-25 NOTE — Evaluation (Signed)
 Physical Therapy Evaluation Patient Details Name: [name] MRN: [id number] DOB: 1960/10/28 Today's Date: 07/25/2023  History of Present Illness  Pt is a 63 y/o F admitted on 07/23/23 after presenting with c/o abdominal pain. Imaging concerning for duodenitis. 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 Pt reports prior to admission she was independent without AD, occasional use of SPC when not feeling well. On this date, pt is able to complete bed mobility & sit<>stand with mod I, ambulate in hallway without AD with mod I with steady gait speed. Pt reports she's doing better than when she came in. At this time, pt does not require acute PT services but would benefit from continued gait with mobility specialists. PT to complete current orders, please re-consult if new needs arise.        If plan is discharge home, recommend the following:     Can travel by private vehicle        Equipment Recommendations None recommended by PT  Recommendations for Other Services       Functional Status Assessment Patient has not had a recent decline in their functional status     Precautions / Restrictions Precautions Precautions: None Restrictions Weight Bearing Restrictions Per Provider Order: No      Mobility  Bed Mobility Overal bed mobility: Modified Independent             General bed mobility comments: HOB elevated but not necessary    Transfers Overall transfer level: Independent                 General transfer comment: STS with use of BUE without assistance    Ambulation/Gait Ambulation/Gait assistance: Modified independent (Device/Increase time) Gait Distance (Feet): 200 Feet Assistive device: None Gait Pattern/deviations: Step-through pattern           Stairs            Wheelchair Mobility     Tilt Bed    Modified Rankin (Stroke Patients Only)       Balance Overall balance assessment: Mild deficits observed, not formally tested                                           Pertinent Vitals/Pain Pain Assessment Pain Assessment: Faces Faces Pain Scale: Hurts little more Pain Location: stomach with some movements Pain Descriptors / Indicators: Discomfort, Grimacing, Guarding Pain Intervention(s): Monitored during session, Limited activity within patient's tolerance    Home Living Family/patient expects to be discharged to:: Shelter/Homeless                        Prior Function               Mobility Comments: Ambulatory without AD, occasional use of SPC, denies falls       Extremity/Trunk Assessment   Upper Extremity Assessment Upper Extremity Assessment: Overall WFL for tasks assessed    Lower Extremity Assessment Lower Extremity Assessment: Generalized weakness    Cervical / Trunk Assessment Cervical / Trunk Assessment:  (rounded shoulders, forward head)  Communication   Communication Communication: No apparent difficulties    Cognition Arousal: Alert Behavior During Therapy: [facility]  for tasks assessed/performed   PT - Cognitive impairments: No apparent impairments                       PT - Cognition Comments: requires encouragement for participation Following commands: Intact       Cueing Cueing Techniques: Verbal cues     General Comments      Exercises Other Exercises Other Exercises: Pt performed 2x sit<>stand from EOB without BUE support with focus on BLE strengthening; pt reports transferring without BUE support causes increased pulling on stomach/stomach pain. Pt does lean posteriorly onto EOB with BLE to aide in transfer.   Assessment/Plan    PT Assessment Patient does not need any further PT services  PT Problem List          PT Treatment Interventions      PT Goals (Current goals can be found in the Care Plan section)  Acute Rehab PT Goals Patient Stated Goal: decreased pain, get better PT Goal Formulation: All assessment and education complete, DC therapy Time For Goal Achievement: 08/08/23 Potential to Achieve Goals: Good    Frequency       Co-evaluation               AM-PAC PT "6 Clicks" Mobility  Outcome Measure Help needed turning from your back to your side while in a flat bed without using bedrails?: None Help needed moving from lying on your back to sitting on the side of a flat bed without using bedrails?: None Help needed moving to and from a bed to a chair (including a wheelchair)?: None Help needed standing up from a chair using your arms (e.g., wheelchair or bedside chair)?: None Help needed to walk in hospital room?: None Help needed climbing 3-5 steps with a railing? : None 6 Click Score: 24    End of Session   Activity Tolerance: Patient tolerated treatment well Patient left: in bed;with call bell/phone within reach (MD in room)        Time: 4696-2952 PT Time Calculation (min) (ACUTE ONLY): 14 min   Charges:   PT Evaluation $PT Eval Low Complexity: 1 Low   PT General Charges $$ ACUTE PT VISIT: 1 Visit         Emaline Handsome, PT, DPT 07/25/23, 9:50 AM   Venetta Gill 07/25/2023, 9:49 AM

## 2023-07-25 NOTE — Progress Notes (Signed)
 [facility] Progress Note   Subjective:   still with some abd pain now with some constipation.  No new symptoms.  No N/V/dysgeusia/pruritus/confusion.   No edema or orthopnea.   Objective Vitals:   07/24/23 1938 07/25/23 0529 07/25/23 0821 07/25/23 0949  BP:  (!) 148/70  (!) 171/78  Pulse:  81  85  Resp:  18  20  Temp:  98.3 F (36.8 C)  98 F (36.7 C)  TempSrc:  Oral  Oral  SpO2: 98% 99% 99% 100%   Physical Exam Gen: chronically ill thin woman  Eyes: EOMI ENT: MMM Neck: supple, no JVD CV: RRR no rub Abd:  TTP epigastrium, no rebound or guarding Lungs a few rhonchi, no rales Extr:  no edema, RUE BC AVF +t/b Neuro: no asterixis, conversant  Additional Objective Labs: Basic Metabolic Panel: Recent Labs  Lab 07/23/23 1740 07/23/23 1757 07/23/23 2249 07/24/23 0602 07/25/23 0653  NA 134* 134* 132* 132* 132*  K 4.1 4.3 4.1 4.1 4.4  CL 102 107  --  104 100  CO2 14*  --   --  14* 17*  GLUCOSE 102* 100*  --  99 89  BUN 72* 64*  --  63* 66*  CREATININE 4.97* 5.20*  --  4.77* 5.30*  CALCIUM  8.8*  --   --  8.4* 8.3*   Liver Function Tests: Recent Labs  Lab 07/23/23 1740 07/25/23 0653  AST 18 28  ALT 19 18  ALKPHOS 133* 113  BILITOT 0.5 0.5  PROT 7.0 5.7*  ALBUMIN  3.2* 2.6*   Recent Labs  Lab 07/23/23 1740  LIPASE 42   CBC: Recent Labs  Lab 07/23/23 1740 07/23/23 1757 07/23/23 2249 07/24/23 0602 07/25/23 0653  WBC 5.2  --   --  4.7 4.6  HGB 11.5*   < > 10.9* 9.7* 8.5*  HCT 35.6*   < > 32.0* 29.1* 25.8*  MCV 98.9  --   --  95.4 96.3  PLT 357  --   --  311 274   < > = values in this interval not displayed.   Blood Culture    Component Value Date/Time   SDES BLOOD LEFT UPPER ARM 06/24/2023 1641   SDES BLOOD SITE NOT SPECIFIED 06/24/2023 1641   SPECREQUEST  06/24/2023 1641    BOTTLES DRAWN AEROBIC AND ANAEROBIC Blood Culture adequate volume   SPECREQUEST  06/24/2023 1641    BOTTLES DRAWN AEROBIC AND ANAEROBIC Blood Culture results  may not be optimal due to an inadequate volume of blood received in culture bottles   CULT  06/24/2023 1641    NO GROWTH 5 DAYS Performed at [facility], [address] [place], [place] [address]    CULT  06/24/2023 1641    NO GROWTH 5 DAYS Performed at [facility], [address], [place], [place] [address]    REPTSTATUS 06/29/2023 FINAL 06/24/2023 1641   REPTSTATUS 06/29/2023 FINAL 06/24/2023 1641    Cardiac Enzymes: No results for input(s): "CKTOTAL", "CKMB", "CKMBINDEX", "TROPONINI" in the last 168 hours. CBG: Recent Labs  Lab 07/24/23 1148  GLUCAP 104*   Iron  Studies:  Recent Labs    07/25/23 0653  IRON  75  TIBC 232*   @lablastinr3 @ Studies/Results: CT ABDOMEN PELVIS WO CONTRAST Result Date: 07/23/2023 CLINICAL DATA:  Epigastric pain for several days, dark bowel movement EXAM: CT ABDOMEN AND PELVIS WITHOUT CONTRAST TECHNIQUE: Multidetector CT imaging of the abdomen and pelvis was performed following the standard protocol without IV  contrast. RADIATION DOSE REDUCTION: This exam was performed according to the departmental dose-optimization program which includes automated exposure control, adjustment of the mA and/or kV according to patient size and/or use of iterative reconstruction technique. COMPARISON:  10/25/2022 FINDINGS: Lower chest: Left lung base is clear. Right-sided pleural effusion is noted with some associated atelectatic changes. Hepatobiliary: No focal liver abnormality is seen. No gallstones, gallbladder wall thickening, or biliary dilatation. Mild free perihepatic fluid is noted. Pancreas: Unremarkable. No pancreatic ductal dilatation or surrounding inflammatory changes. Spleen: Normal in size without focal abnormality. Adrenals/Urinary Tract: Adrenal glands are within normal limits. No renal calculi or urinary tract obstructive changes are noted. Now rotation is noted in the right kidney although stable from the prior exam. Ureters are within normal  limits. Bladder is well distended. Stomach/Bowel: Scattered diverticular change of the colon is noted without evidence of diverticulitis. Appendix is within normal limits. Stomach is within normal limits. Jejunum and ileum appear unremarkable. Wall thickening and inflammatory changes are noted in the second portion the duodenum consistent with focal duodenitis. No definitive ulcer is seen. No perforation is noted. Vascular/Lymphatic: Aortic atherosclerosis. No enlarged abdominal or pelvic lymph nodes. Reproductive: Uterus and bilateral adnexa are unremarkable. Other: Mild free fluid is noted in the pelvis and surrounding the liver. Musculoskeletal: No acute or significant osseous findings. IMPRESSION: Changes consistent with duodenitis in the second portion of the duodenum. No perforation is seen. Mild free fluid is noted within the abdomen and pelvis. Small right effusion with associated atelectatic changes. Electronically Signed   By: Violeta Grey M.D.   On: 07/23/2023 21:29   US  Abdomen Limited RUQ (LIVER/GB) Result Date: 07/23/2023 CLINICAL DATA:  Right upper quadrant abdominal pain. EXAM: ULTRASOUND ABDOMEN LIMITED RIGHT UPPER QUADRANT COMPARISON:  October 25, 2022.  November 15, 2021. FINDINGS: Gallbladder: No cholelithiasis is noted, but moderate wall thickening is noted at 5.5 mm which may be due to surrounding ascites or adjacent hepatocellular disease. Positive sonographic Murphy's sign is noted. Common bile duct: Diameter: 4 mm which is within normal limits. Liver: Nodular hepatic contours are noted suggesting possible hepatic cirrhosis. Increased echogenicity of hepatic parenchyma is noted suggesting hepatic steatosis. Portal vein is patent on color Doppler imaging with normal direction of blood flow towards the liver. Other: Mild ascites is noted. 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 Possible hepatic steatosis is noted as well. Mild ascites is noted. No  definite cholelithiasis is noted. Moderate gallbladder wall thickening is noted most likely due to surrounding ascites or diffuse hepatocellular disease. Electronically Signed   By: Rosalene Colon M.D.   On: 07/23/2023 19:35   DG Chest Portable 1 View Result Date: 07/23/2023 CLINICAL DATA:  Shortness of breath. EXAM: PORTABLE CHEST 1 VIEW COMPARISON:  June 24, 2023. FINDINGS: Stable cardiomegaly. Left lung is clear. Small right pleural effusion is noted which is significantly decreased compared to prior exam. Minimal right basilar subsegmental atelectasis is noted. Bony thorax is unremarkable. IMPRESSION: Decreased right pleural effusion compared to prior exam with minimal right basilar atelectasis. Electronically Signed   By: Rosalene Colon M.D.   On: 07/23/2023 18:43   Medications:   amLODipine   10 mg Oral Daily   atorvastatin   10 mg Oral Daily   budeson-glycopyrrolate -formoterol   2 puff Inhalation BID   busPIRone   10 mg Oral TID   carvedilol   6.25 mg Oral BID WC   Chlorhexidine  Gluconate Cloth  6 each Topical Daily   gabapentin   100 mg Oral TID  Glycerin (Adult)  1 suppository Rectal Once   mupirocin  ointment  1 Application Nasal BID   nicotine   21 mg Transdermal Daily   pantoprazole  (PROTONIX ) IV  40 mg Intravenous Q12H   sertraline   25 mg Oral Daily   sodium bicarbonate   1,300 mg Oral TID   sucralfate   1 g Oral TID WC & HS    Assessment/Plan: **Duodenitis: etiology of abd pain, not a uremic finding; per primary   **ESRD vs CKD 5: no regular dialysis.  I see no uremic symptoms, volume issues or major electrolyte issues to be corrected with HD.  Additionally she declines tx today.     **HTN:  cont outpt meds   **Anemia of CKD:  Hb 9.7, s/p dose aranesp  while in and repeat iron  studies - iron  sat 32% 07/25/23   **Metabolic bone dz: corr ca normal, 4/15 phos 4.9.  on calcitriol .    **metabolic acidosis: VBG 7.45 / 16 - compensated, hasn't been very adherent to po bicarb, titrate to  TID   [facility] for d/c from nephrology perspective.  Nothing further to add, please call back if we can further assist.   Adrian Alba MD 07/25/2023, 10:52 AM  [facility] Pager: [phone]

## 2023-07-26 ENCOUNTER — Other Ambulatory Visit (HOSPITAL_COMMUNITY): Payer: Self-pay

## 2023-07-26 LAB — CBC
HCT: 27.4 % — ABNORMAL LOW (ref 36.0–46.0)
Hemoglobin: 8.3 g/dL — ABNORMAL LOW (ref 12.0–15.0)
MCH: 31 pg (ref 26.0–34.0)
MCHC: 30.3 g/dL (ref 30.0–36.0)
MCV: 102.2 fL — ABNORMAL HIGH (ref 80.0–100.0)
Platelets: 263 10*3/uL (ref 150–400)
RBC: 2.68 MIL/uL — ABNORMAL LOW (ref 3.87–5.11)
RDW: 19.4 % — ABNORMAL HIGH (ref 11.5–15.5)
WBC: 4.7 10*3/uL (ref 4.0–10.5)
nRBC: 0 % (ref 0.0–0.2)

## 2023-07-26 MED ORDER — SODIUM BICARBONATE 650 MG PO TABS
1300.0000 mg | ORAL_TABLET | Freq: Two times a day (BID) | ORAL | 0 refills | Status: DC
  Filled 2023-07-26: qty 360, 90d supply, fill #0

## 2023-07-26 MED ORDER — MILK AND MOLASSES ENEMA
1.0000 | Freq: Once | RECTAL | Status: AC
  Administered 2023-07-26: 240 mL via RECTAL
  Filled 2023-07-26: qty 240

## 2023-07-26 MED ORDER — TRAZODONE HCL 50 MG PO TABS
50.0000 mg | ORAL_TABLET | Freq: Every evening | ORAL | 0 refills | Status: DC | PRN
  Filled 2023-07-26: qty 30, 30d supply, fill #0

## 2023-07-26 MED ORDER — SERTRALINE HCL 25 MG PO TABS
25.0000 mg | ORAL_TABLET | Freq: Every day | ORAL | 0 refills | Status: DC
  Filled 2023-07-26 – 2023-10-15 (×2): qty 30, 30d supply, fill #0

## 2023-07-26 MED ORDER — TRELEGY ELLIPTA 100-62.5-25 MCG/ACT IN AEPB
1.0000 | INHALATION_SPRAY | Freq: Every day | RESPIRATORY_TRACT | 0 refills | Status: DC
  Filled 2023-07-26: qty 60, 30d supply, fill #0

## 2023-07-26 MED ORDER — NICOTINE 21 MG/24HR TD PT24
21.0000 mg | MEDICATED_PATCH | Freq: Every day | TRANSDERMAL | 0 refills | Status: DC
  Filled 2023-07-26: qty 28, 28d supply, fill #0

## 2023-07-26 MED ORDER — ALBUTEROL SULFATE HFA 108 (90 BASE) MCG/ACT IN AERS
2.0000 | INHALATION_SPRAY | Freq: Four times a day (QID) | RESPIRATORY_TRACT | 0 refills | Status: DC | PRN
  Filled 2023-07-26 – 2023-10-15 (×2): qty 18, 25d supply, fill #0

## 2023-07-26 MED ORDER — BUSPIRONE HCL 10 MG PO TABS
10.0000 mg | ORAL_TABLET | Freq: Three times a day (TID) | ORAL | 0 refills | Status: DC
  Filled 2023-07-26 – 2023-10-15 (×2): qty 90, 30d supply, fill #0

## 2023-07-26 MED ORDER — CALCITRIOL 0.25 MCG PO CAPS
0.2500 ug | ORAL_CAPSULE | Freq: Every day | ORAL | 0 refills | Status: DC
  Filled 2023-07-26 – 2023-10-15 (×2): qty 30, 30d supply, fill #0

## 2023-07-26 MED ORDER — TORSEMIDE 100 MG PO TABS
100.0000 mg | ORAL_TABLET | Freq: Every day | ORAL | 0 refills | Status: DC
  Filled 2023-07-26: qty 90, 90d supply, fill #0

## 2023-07-26 MED ORDER — OXYCODONE HCL 5 MG PO TABS
5.0000 mg | ORAL_TABLET | ORAL | 0 refills | Status: DC | PRN
  Filled 2023-07-26: qty 10, 2d supply, fill #0

## 2023-07-26 MED ORDER — BENZONATATE 100 MG PO CAPS
100.0000 mg | ORAL_CAPSULE | Freq: Three times a day (TID) | ORAL | 0 refills | Status: DC | PRN
  Filled 2023-07-26: qty 60, 20d supply, fill #0

## 2023-07-26 MED ORDER — ATORVASTATIN CALCIUM 10 MG PO TABS
10.0000 mg | ORAL_TABLET | Freq: Every day | ORAL | 0 refills | Status: DC
  Filled 2023-07-26: qty 90, 90d supply, fill #0

## 2023-07-26 MED ORDER — CARVEDILOL 6.25 MG PO TABS
6.2500 mg | ORAL_TABLET | Freq: Two times a day (BID) | ORAL | 0 refills | Status: DC
  Filled 2023-07-26: qty 180, 90d supply, fill #0

## 2023-07-26 MED ORDER — SUCRALFATE 1 GM/10ML PO SUSP
1.0000 g | Freq: Four times a day (QID) | ORAL | 0 refills | Status: DC
  Filled 2023-07-26: qty 840, 21d supply, fill #0

## 2023-07-26 MED ORDER — PANTOPRAZOLE SODIUM 40 MG PO TBEC
40.0000 mg | DELAYED_RELEASE_TABLET | Freq: Two times a day (BID) | ORAL | 0 refills | Status: DC
  Filled 2023-07-26 – 2023-10-15 (×2): qty 60, 30d supply, fill #0

## 2023-07-26 MED ORDER — MINERAL OIL RE ENEM
1.0000 | ENEMA | Freq: Once | RECTAL | Status: AC
  Administered 2023-07-26: 1 via RECTAL
  Filled 2023-07-26: qty 1

## 2023-07-26 MED ORDER — FERROUS SULFATE 325 (65 FE) MG PO TABS
325.0000 mg | ORAL_TABLET | Freq: Every day | ORAL | 0 refills | Status: DC
  Filled 2023-07-26: qty 100, 100d supply, fill #0

## 2023-07-26 MED ORDER — DOCUSATE SODIUM 283 MG RE ENEM
1.0000 | ENEMA | Freq: Once | RECTAL | Status: DC
  Filled 2023-07-26: qty 1

## 2023-07-26 MED ORDER — AMLODIPINE BESYLATE 10 MG PO TABS
10.0000 mg | ORAL_TABLET | Freq: Every day | ORAL | 0 refills | Status: DC
  Filled 2023-07-26: qty 90, 90d supply, fill #0

## 2023-07-26 MED ORDER — GABAPENTIN 100 MG PO CAPS
100.0000 mg | ORAL_CAPSULE | Freq: Three times a day (TID) | ORAL | 0 refills | Status: DC
  Filled 2023-07-26: qty 270, 90d supply, fill #0

## 2023-07-26 NOTE — Plan of Care (Signed)
  Problem: Health Behavior/Discharge Planning: Goal: Ability to manage health-related needs will improve Outcome: Progressing   Problem: Clinical Measurements: Goal: Ability to maintain clinical measurements within normal limits will improve Outcome: Progressing Goal: Will remain free from infection Outcome: Progressing Goal: Diagnostic test results will improve Outcome: Progressing Goal: Respiratory complications will improve Outcome: Progressing Goal: Cardiovascular complication will be avoided Outcome: Progressing   Problem: Activity: Goal: Risk for activity intolerance will decrease Outcome: Progressing   Problem: Nutrition: Goal: Adequate nutrition will be maintained Outcome: Progressing   Problem: Elimination: Goal: Will not experience complications related to bowel motility Outcome: Progressing Goal: Will not experience complications related to urinary retention Outcome: Progressing   Problem: Pain Managment: Goal: General experience of comfort will improve and/or be controlled Outcome: Progressing   Problem: Safety: Goal: Ability to remain free from injury will improve Outcome: Progressing   Problem: Skin Integrity: Goal: Risk for impaired skin integrity will decrease Outcome: Progressing   Problem: Education: Goal: Knowledge of disease and its progression will improve Outcome: Progressing Goal: Individualized Educational Video(s) Outcome: Progressing   Problem: Fluid Volume: Goal: Compliance with measures to maintain balanced fluid volume will improve Outcome: Progressing   Problem: Health Behavior/Discharge Planning: Goal: Ability to manage health-related needs will improve Outcome: Progressing   Problem: Nutritional: Goal: Ability to make healthy dietary choices will improve Outcome: Progressing   Problem: Clinical Measurements: Goal: Complications related to the disease process, condition or treatment will be avoided or minimized Outcome:  Progressing

## 2023-07-26 NOTE — Plan of Care (Signed)

## 2023-07-26 NOTE — Discharge Summary (Signed)
 Physician Discharge Summary  [name] ZOX:[id number] DOB: 1960-09-30 DOA: 07/23/2023  PCP: Lawrance Presume, MD  Admit date: 07/23/2023 Discharge date: 07/26/2023  Admitted From: home Disposition:  home  Recommendations for Outpatient Follow-up:  Follow up with PCP in 1-2 weeks  Home Health: none Equipment/Devices: none  Discharge Condition: stable CODE STATUS: Full code Diet Orders (From admission, onward)     Start     Ordered   07/24/23 0007  DIET SOFT Room service appropriate? Yes; Fluid consistency: Thin  Diet effective now       Question Answer Comment  Room service appropriate? Yes   Fluid consistency: Thin      07/24/23 0006            HPI: Per admitting MD, [name] is a 63 y.o. 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 Patient reports developing upper abdominal pain about 2 days ago.  She has been having significant indigestion and heartburn symptoms.  She reports a couple episodes of nausea and emesis which has resolved.  She reports having some loose stools. Patient is not on scheduled dialysis due to limitations related to her homelessness.  She last dialyzed on 4/14 during prior admission.  She says she still makes urine.  She has not had any peripheral edema or dyspnea. Patient reports smoking almost 1 pack of cigarettes daily.  She denies alcohol  use.  She reports occasional marijuana use as well as occasional cocaine use.  She says she last used cocaine 1 week ago.  Hospital Course / Discharge diagnoses: Principal Problem:   Duodenitis Active Problems:   ESRD on dialysis [facility])   Essential hypertension   Anemia of chronic disease   COPD (chronic obstructive pulmonary  disease) (HCC)   Anxiety and depression   Hyperlipidemia   Homelessness   Polysubstance abuse (HCC)   Principal problem Duodenitis, prior upper GI bleed due to PUD/AVMs - patient was admitted to the hospital with worsening symptoms in terms of her duodenitis.  She has been placed on PPI, sucralfate , overall symptoms improving. 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  She ran out of her medications and is likely a culprit for worsening of her symptoms.  Hemoglobin has trending down but overall is stable in the last 2 days, she has no evidence of ongoing GI bleed   Problems ESRD-nephrology consulted, appreciate input.  Last time she was dialyzed on April 14.  Patient tells me she can go up to 2 months without dialysis.  She was not dialyzed as there was no acute indication for dialysis Anemia-of chronic disease, ESRD  Hyponatremia-in the setting of slight fluid overload Polysubstance use-continues to smoke, marijuana, cocaine.  Counseled for cessation Essential hypertension-continue amlodipine , Coreg  Depression/anxiety-continue BuSpar , Zoloft  Homelessness-noted, all medications refilled at the hospital prior to discharge  Sepsis ruled out   Discharge Instructions   Allergies as of 07/26/2023       Reactions   Zestril  [lisinopril ] Other (See Comments)   Hyperkalemia        Medication List     STOP taking these medications  Breztri  Aerosphere 160-9-4.8 MCG/ACT Aero inhaler Generic drug: budeson-glycopyrrolate -formoterol        TAKE these medications    acetaminophen  325 MG tablet Commonly known as: TYLENOL  Take 2 tablets (650 mg total) by mouth every 6 (six) hours as needed for mild pain (pain score 1-3) (or Fever >/= 101).   albuterol  108 (90 Base) MCG/ACT inhaler Commonly known as:  VENTOLIN  HFA Inhale 2 puffs into the lungs every 6 (six) hours as needed for wheezing or shortness of breath.   amLODipine  10 MG tablet Commonly known as: NORVASC  Take 1 tablet (10 mg total) by mouth daily.   atorvastatin  10 MG tablet Commonly known as: LIPITOR Take 1 tablet (10 mg total) by mouth daily.   benzonatate  100 MG capsule Commonly known as: TESSALON  Take 1 capsule (100 mg total) by mouth 3 (three) times daily as needed for cough. What changed:  when to take this reasons to take this   busPIRone  10 MG tablet Commonly known as: BUSPAR  Take 1 tablet (10 mg total) by mouth 3 (three) times daily.   calcitRIOL  0.25 MCG capsule Commonly known as: ROCALTROL  Take 1 capsule (0.25 mcg total) by mouth daily.   carvedilol  6.25 MG tablet Commonly known as: COREG  Take 1 tablet (6.25 mg total) by mouth 2 (two) times daily with a meal.   ferrous sulfate  325 (65 FE) MG tablet Take 1 tablet (325 mg total) by mouth daily with breakfast.   gabapentin  100 MG capsule Commonly known as: NEURONTIN  Take 1 capsule (100 mg total) by mouth 3 (three) times daily.   nicotine  21 mg/24hr patch Commonly known as: NICODERM CQ  - dosed in mg/24 hours Place 1 patch (21 mg total) onto the skin daily. Start taking on: Jul 27, 2023   oxyCODONE  5 MG immediate release tablet Commonly known as: Oxy IR/ROXICODONE  Take 1 tablet (5 mg total) by mouth every 4 (four) hours as needed for severe pain (pain score 7-10).   pantoprazole  40 MG tablet Commonly known as: PROTONIX  Take 1 tablet (40 mg total) by mouth 2 (two) times daily.   sertraline  25 MG tablet Commonly known as: ZOLOFT  Take 1 tablet (25 mg total) by mouth daily.   sodium bicarbonate  650 MG tablet Take 2 tablets (1,300 mg total) by mouth 2 (two) times daily.   sucralfate  1 GM/10ML suspension Commonly known as: CARAFATE  Take 10 mLs (1 g total) by mouth 4 (four) times daily for 21 days.   torsemide  100 MG tablet Commonly known as:  DEMADEX  Take 1 tablet (100 mg total) by mouth daily.   traZODone  50 MG tablet Commonly known as: DESYREL  Take 1 tablet (50 mg total) by mouth at bedtime as needed for sleep.   Trelegy Ellipta  100-62.5-25 MCG/ACT Aepb Generic drug: Fluticasone -Umeclidin-Vilant Inhale 1 puff into the lungs daily.         Consultations: Nephrology  Procedures/Studies:  CT ABDOMEN PELVIS WO CONTRAST Result Date: 07/23/2023 CLINICAL DATA:  Epigastric pain for several days, dark bowel movement EXAM: CT ABDOMEN AND PELVIS WITHOUT CONTRAST TECHNIQUE: Multidetector CT imaging of the abdomen and pelvis was performed following the standard protocol without IV contrast. RADIATION DOSE REDUCTION: This exam was performed according to the departmental dose-optimization program which includes automated exposure control, adjustment of the mA and/or kV according to patient size and/or use of iterative reconstruction technique. COMPARISON:  10/25/2022 FINDINGS: Lower chest: Left lung base is clear. Right-sided pleural effusion is noted with some associated atelectatic changes. Hepatobiliary: No focal liver abnormality is seen. No gallstones,  gallbladder wall thickening, or biliary dilatation. Mild free perihepatic fluid is noted. Pancreas: Unremarkable. No pancreatic ductal dilatation or surrounding inflammatory changes. Spleen: Normal in size without focal abnormality. Adrenals/Urinary Tract: Adrenal glands are within normal limits. No renal calculi or urinary tract obstructive changes are noted. Now rotation is noted in the right kidney although stable from the prior exam. Ureters are within normal limits. Bladder is well distended. Stomach/Bowel: Scattered diverticular change of the colon is noted without evidence of diverticulitis. Appendix is within normal limits. Stomach is within normal limits. Jejunum and ileum appear unremarkable. Wall thickening and inflammatory changes are noted in the second portion the duodenum  consistent with focal duodenitis. No definitive ulcer is seen. No perforation is noted. Vascular/Lymphatic: Aortic atherosclerosis. No enlarged abdominal or pelvic lymph nodes. Reproductive: Uterus and bilateral adnexa are unremarkable. Other: Mild free fluid is noted in the pelvis and surrounding the liver. Musculoskeletal: No acute or significant osseous findings. IMPRESSION: Changes consistent with duodenitis in the second portion of the duodenum. No perforation is seen. Mild free fluid is noted within the abdomen and pelvis. Small right effusion with associated atelectatic changes. Electronically Signed   By: Violeta Grey M.D.   On: 07/23/2023 21:29   US  Abdomen Limited RUQ (LIVER/GB) Result Date: 07/23/2023 CLINICAL DATA:  Right upper quadrant abdominal pain. EXAM: ULTRASOUND ABDOMEN LIMITED RIGHT UPPER QUADRANT COMPARISON:  October 25, 2022.  November 15, 2021. FINDINGS: Gallbladder: No cholelithiasis is noted, but moderate wall thickening is noted at 5.5 mm which may be due to surrounding ascites or adjacent hepatocellular disease. Positive sonographic Murphy's sign is noted. Common bile duct: Diameter: 4 mm which is within normal limits. Liver: Nodular hepatic contours are noted suggesting possible hepatic cirrhosis. Increased echogenicity of hepatic parenchyma is noted suggesting hepatic steatosis. Portal vein is patent on color Doppler imaging with normal direction of blood flow towards the liver. Other: Mild ascites is noted. 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 Possible hepatic steatosis is noted as well. Mild ascites is noted. No definite cholelithiasis is noted. Moderate gallbladder wall thickening is noted most likely due to surrounding ascites or diffuse hepatocellular disease. Electronically Signed   By: Rosalene Colon M.D.   On: 07/23/2023 19:35   DG Chest Portable 1 View Result Date: 07/23/2023 CLINICAL DATA:  Shortness of breath. EXAM: PORTABLE  CHEST 1 VIEW COMPARISON:  June 24, 2023. FINDINGS: Stable cardiomegaly. Left lung is clear. Small right pleural effusion is noted which is significantly decreased compared to prior exam. Minimal right basilar subsegmental atelectasis is noted. Bony thorax is unremarkable. IMPRESSION: Decreased right pleural effusion compared to prior exam with minimal right basilar atelectasis. Electronically Signed   By: Rosalene Colon M.D.   On: 07/23/2023 18:43     Subjective: - no chest pain, shortness of breath, no abdominal pain, nausea or vomiting.   Discharge Exam: BP (!) 171/83 (BP Location: Left Arm)   Pulse 79   Temp 97.8 F (36.6 C) (Oral)   Resp 18   SpO2 100%   General: Pt is alert, awake, not in acute distress Cardiovascular: RRR, S1/S2 +, no rubs, no gallops Respiratory: CTA bilaterally, no wheezing, no rhonchi Abdominal: Soft, NT, ND, bowel sounds + Extremities: no edema, no cyanosis    The results of significant diagnostics from this hospitalization (including imaging, microbiology, ancillary and laboratory) are listed below for reference.     Microbiology: Recent Results (from the past 240 hours)  MRSA Next Gen by PCR,  Nasal     Status: Abnormal   Collection Time: 07/24/23  4:00 AM   Specimen: Nasal Mucosa; Nasal Swab  Result Value Ref Range Status   MRSA by PCR Next Gen DETECTED (A) NOT DETECTED Final    Comment: RESULT CALLED TO, READ BACK BY AND VERIFIED WITH: T ROSS RN 07/24/2023 @ 0554 BY AB (NOTE) The GeneXpert MRSA Assay (FDA approved for NASAL specimens only), is one component of a comprehensive MRSA colonization surveillance program. It is not intended to diagnose MRSA infection nor to guide or monitor treatment for MRSA infections. Test performance is not FDA approved in patients less than 18 years old. Performed at [facility], [address] [place], [place] [address]      Labs: Basic Metabolic Panel: Recent Labs  Lab 07/23/23 1740 07/23/23 1757  07/23/23 2249 07/24/23 0602 07/25/23 0653  NA 134* 134* 132* 132* 132*  K 4.1 4.3 4.1 4.1 4.4  CL 102 107  --  104 100  CO2 14*  --   --  14* 17*  GLUCOSE 102* 100*  --  99 89  BUN 72* 64*  --  63* 66*  CREATININE 4.97* 5.20*  --  4.77* 5.30*  CALCIUM  8.8*  --   --  8.4* 8.3*  MG  --   --   --   --  2.1   Liver Function Tests: Recent Labs  Lab 07/23/23 1740 07/25/23 0653  AST 18 28  ALT 19 18  ALKPHOS 133* 113  BILITOT 0.5 0.5  PROT 7.0 5.7*  ALBUMIN  3.2* 2.6*   CBC: Recent Labs  Lab 07/23/23 1740 07/23/23 1757 07/23/23 2249 07/24/23 0602 07/25/23 0653 07/26/23 0817  WBC 5.2  --   --  4.7 4.6 4.7  HGB 11.5* 11.9* 10.9* 9.7* 8.5* 8.3*  HCT 35.6* 35.0* 32.0* 29.1* 25.8* 27.4*  MCV 98.9  --   --  95.4 96.3 102.2*  PLT 357  --   --  311 274 263   CBG: Recent Labs  Lab 07/24/23 1148  GLUCAP 104*   Hgb A1c No results for input(s): "HGBA1C" in the last 72 hours. Lipid Profile No results for input(s): "CHOL", "HDL", "LDLCALC", "TRIG", "CHOLHDL", "LDLDIRECT" in the last 72 hours. Thyroid function studies No results for input(s): "TSH", "T4TOTAL", "T3FREE", "THYROIDAB" in the last 72 hours.  Invalid input(s): "FREET3" Urinalysis    Component Value Date/Time   COLORURINE YELLOW 04/16/2023 0200   APPEARANCEUR HAZY (A) 04/16/2023 0200   LABSPEC 1.009 04/16/2023 0200   PHURINE 6.0 04/16/2023 0200   GLUCOSEU 50 (A) 04/16/2023 0200   HGBUR SMALL (A) 04/16/2023 0200   BILIRUBINUR NEGATIVE 04/16/2023 0200   KETONESUR NEGATIVE 04/16/2023 0200   PROTEINUR >=300 (A) 04/16/2023 0200   UROBILINOGEN 0.2 03/02/2010 2027   NITRITE NEGATIVE 04/16/2023 0200   LEUKOCYTESUR MODERATE (A) 04/16/2023 0200    FURTHER DISCHARGE INSTRUCTIONS:   Get Medicines reviewed and adjusted: Please take all your medications with you for your next visit with your Primary MD   Laboratory/radiological data: Please request your Primary MD to go over all hospital tests and  procedure/radiological results at the follow up, please ask your Primary MD to get all Hospital records sent to his/her office.   In some cases, they will be blood work, cultures and biopsy results pending at the time of your discharge. Please request that your primary care M.D. goes through all the records of your hospital data and follows up on these results.  Also Note the following: If you experience worsening of your admission symptoms, develop shortness of breath, life threatening emergency, suicidal or homicidal thoughts you must seek medical attention immediately by calling 911 or calling your MD immediately  if symptoms less severe.   You must read complete instructions/literature along with all the possible adverse reactions/side effects for all the Medicines you take and that have been prescribed to you. Take any new Medicines after you have completely understood and accpet all the possible adverse reactions/side effects.    Do not drive when taking Pain medications or sleeping medications (Benzodaizepines)   Do not take more than prescribed Pain, Sleep and Anxiety Medications. It is not advisable to combine anxiety,sleep and pain medications without talking with your primary care practitioner   Special Instructions: If you have smoked or chewed Tobacco  in the last 2 yrs please stop smoking, stop any regular Alcohol   and or any Recreational drug use.   Wear Seat belts while driving.   Please note: You were cared for by a hospitalist during your hospital stay. Once you are discharged, your primary care physician will handle any further medical issues. Please note that NO REFILLS for any discharge medications will be authorized once you are discharged, as it is imperative that you return to your primary care physician (or establish a relationship with a primary care physician if you do not have one) for your post hospital discharge needs so that they can reassess your need for  medications and monitor your lab values.  Time coordinating discharge: 35 minutes  SIGNED:  Kathlen Para, MD, PhD 07/26/2023, 9:43 AM

## 2023-07-26 NOTE — Progress Notes (Signed)
 DISCHARGE NOTE HOME [name] to be discharged Home per MD order. Discussed prescriptions and follow up appointments with the patient. Prescriptions given to patient; medication list explained in detail. Patient verbalized understanding.  Skin clean, dry and intact without evidence of skin break down, no evidence of skin tears noted. IV catheter discontinued intact. Site without signs and symptoms of complications. Dressing and pressure applied. Pt denies pain at the site currently. No complaints noted.  Patient free of lines, drains, and wounds.   An After Visit Summary (AVS) was printed and given to the patient. Patient escorted via wheelchair, and discharged home via private auto.  Elvina Hammers, RN

## 2023-07-26 NOTE — Progress Notes (Signed)
 Pt. Refusing VS this morning  [name]

## 2023-07-28 ENCOUNTER — Telehealth: Payer: Self-pay | Admitting: *Deleted

## 2023-07-28 NOTE — Transitions of Care (Post Inpatient/ED Visit) (Signed)
   07/28/2023  Name: STARLETTE COHEA MRN: [id number] DOB: Jul 25, 1960  Today's TOC FU Call Status: Today's TOC FU Call Status:: Unsuccessful Call (1st Attempt) Unsuccessful Call (1st Attempt) Date: 07/28/23  Attempted to reach the patient regarding the most recent Inpatient/ED visit.  Follow Up Plan: Additional outreach attempts will be made to reach the patient to complete the Transitions of Care (Post Inpatient/ED visit) call.   Arna Better RN, BSN [place]  [facility] Health RN Care Manager [phone]

## 2023-07-29 ENCOUNTER — Telehealth: Payer: Self-pay | Admitting: *Deleted

## 2023-07-29 NOTE — Transitions of Care (Post Inpatient/ED Visit) (Signed)
   07/29/2023  Name: [name] MRN: [id number] DOB: Jun 07, 1960  Today's TOC FU Call Status: Today's TOC FU Call Status:: Unsuccessful Call (2nd Attempt) Unsuccessful Call (2nd Attempt) Date: 07/29/23  Attempted to reach the patient regarding the most recent Inpatient/ED visit.  Follow Up Plan: Additional outreach attempts will be made to reach the patient to complete the Transitions of Care (Post Inpatient/ED visit) call.   Una Ganser BSN RN [facility] Health Care Management Coordinator [email or web address] Direct Dial : [phone]  Fax: [phone] Website: [email or web address]

## 2023-07-30 ENCOUNTER — Telehealth: Payer: Self-pay | Admitting: *Deleted

## 2023-07-30 NOTE — Transitions of Care (Post Inpatient/ED Visit) (Signed)
   07/30/2023  Name: [name] MRN: [id number] DOB: 02-Nov-1960  Today's TOC FU Call Status: Today's TOC FU Call Status:: Unsuccessful Call (3rd Attempt) Unsuccessful Call (3rd Attempt) Date: 07/30/23  Attempted to reach the patient regarding the most recent Inpatient/ED visit.  Follow Up Plan: No further outreach attempts will be made at this time. We have been unable to contact the patient.  Arna Better RN, BSN [place]  [facility] Health RN Care Manager [phone]

## 2023-09-06 ENCOUNTER — Other Ambulatory Visit: Payer: Self-pay

## 2023-09-06 ENCOUNTER — Inpatient Hospital Stay (HOSPITAL_COMMUNITY)
Admission: EM | Admit: 2023-09-06 | Discharge: 2023-09-19 | DRG: 377 | Discharge status: Against Medical Advice | Payer: Self-pay | Attending: Student | Admitting: Student

## 2023-09-06 ENCOUNTER — Emergency Department (HOSPITAL_COMMUNITY): Payer: Self-pay

## 2023-09-06 DIAGNOSIS — K219 Gastro-esophageal reflux disease without esophagitis: Secondary | ICD-10-CM | POA: Diagnosis present

## 2023-09-06 DIAGNOSIS — R079 Chest pain, unspecified: Secondary | ICD-10-CM

## 2023-09-06 DIAGNOSIS — E785 Hyperlipidemia, unspecified: Secondary | ICD-10-CM | POA: Diagnosis present

## 2023-09-06 DIAGNOSIS — G8929 Other chronic pain: Secondary | ICD-10-CM | POA: Diagnosis present

## 2023-09-06 DIAGNOSIS — Z66 Do not resuscitate: Secondary | ICD-10-CM | POA: Diagnosis present

## 2023-09-06 DIAGNOSIS — Z5982 Transportation insecurity: Secondary | ICD-10-CM

## 2023-09-06 DIAGNOSIS — I132 Hypertensive heart and chronic kidney disease with heart failure and with stage 5 chronic kidney disease, or end stage renal disease: Secondary | ICD-10-CM | POA: Diagnosis present

## 2023-09-06 DIAGNOSIS — K222 Esophageal obstruction: Secondary | ICD-10-CM | POA: Diagnosis present

## 2023-09-06 DIAGNOSIS — E1122 Type 2 diabetes mellitus with diabetic chronic kidney disease: Secondary | ICD-10-CM | POA: Diagnosis present

## 2023-09-06 DIAGNOSIS — J90 Pleural effusion, not elsewhere classified: Secondary | ICD-10-CM | POA: Diagnosis present

## 2023-09-06 DIAGNOSIS — Z860101 Personal history of adenomatous and serrated colon polyps: Secondary | ICD-10-CM

## 2023-09-06 DIAGNOSIS — Z1152 Encounter for screening for COVID-19: Secondary | ICD-10-CM | POA: Diagnosis not present

## 2023-09-06 DIAGNOSIS — I5032 Chronic diastolic (congestive) heart failure: Secondary | ICD-10-CM | POA: Diagnosis present

## 2023-09-06 DIAGNOSIS — Z5941 Food insecurity: Secondary | ICD-10-CM

## 2023-09-06 DIAGNOSIS — K2289 Other specified disease of esophagus: Secondary | ICD-10-CM | POA: Diagnosis present

## 2023-09-06 DIAGNOSIS — Z6824 Body mass index (BMI) 24.0-24.9, adult: Secondary | ICD-10-CM

## 2023-09-06 DIAGNOSIS — F32A Depression, unspecified: Secondary | ICD-10-CM | POA: Diagnosis present

## 2023-09-06 DIAGNOSIS — Z8616 Personal history of COVID-19: Secondary | ICD-10-CM | POA: Diagnosis not present

## 2023-09-06 DIAGNOSIS — F1410 Cocaine abuse, uncomplicated: Secondary | ICD-10-CM | POA: Diagnosis present

## 2023-09-06 DIAGNOSIS — N186 End stage renal disease: Secondary | ICD-10-CM | POA: Diagnosis present

## 2023-09-06 DIAGNOSIS — K2970 Gastritis, unspecified, without bleeding: Secondary | ICD-10-CM | POA: Diagnosis present

## 2023-09-06 DIAGNOSIS — J4489 Other specified chronic obstructive pulmonary disease: Secondary | ICD-10-CM | POA: Diagnosis present

## 2023-09-06 DIAGNOSIS — F1910 Other psychoactive substance abuse, uncomplicated: Secondary | ICD-10-CM | POA: Diagnosis present

## 2023-09-06 DIAGNOSIS — Z5900 Homelessness unspecified: Secondary | ICD-10-CM | POA: Diagnosis not present

## 2023-09-06 DIAGNOSIS — Z720 Tobacco use: Secondary | ICD-10-CM | POA: Diagnosis present

## 2023-09-06 DIAGNOSIS — K31811 Angiodysplasia of stomach and duodenum with bleeding: Secondary | ICD-10-CM | POA: Diagnosis present

## 2023-09-06 DIAGNOSIS — D62 Acute posthemorrhagic anemia: Secondary | ICD-10-CM | POA: Diagnosis present

## 2023-09-06 DIAGNOSIS — F17200 Nicotine dependence, unspecified, uncomplicated: Secondary | ICD-10-CM | POA: Diagnosis present

## 2023-09-06 DIAGNOSIS — J9601 Acute respiratory failure with hypoxia: Secondary | ICD-10-CM | POA: Diagnosis not present

## 2023-09-06 DIAGNOSIS — K449 Diaphragmatic hernia without obstruction or gangrene: Secondary | ICD-10-CM | POA: Diagnosis present

## 2023-09-06 DIAGNOSIS — Z7951 Long term (current) use of inhaled steroids: Secondary | ICD-10-CM

## 2023-09-06 DIAGNOSIS — Z992 Dependence on renal dialysis: Secondary | ICD-10-CM

## 2023-09-06 DIAGNOSIS — N2581 Secondary hyperparathyroidism of renal origin: Secondary | ICD-10-CM | POA: Diagnosis present

## 2023-09-06 DIAGNOSIS — I10 Essential (primary) hypertension: Secondary | ICD-10-CM | POA: Diagnosis present

## 2023-09-06 DIAGNOSIS — Z79899 Other long term (current) drug therapy: Secondary | ICD-10-CM

## 2023-09-06 DIAGNOSIS — D631 Anemia in chronic kidney disease: Secondary | ICD-10-CM | POA: Diagnosis present

## 2023-09-06 DIAGNOSIS — Z91158 Patient's noncompliance with renal dialysis for other reason: Secondary | ICD-10-CM

## 2023-09-06 DIAGNOSIS — F17210 Nicotine dependence, cigarettes, uncomplicated: Secondary | ICD-10-CM | POA: Diagnosis present

## 2023-09-06 DIAGNOSIS — J449 Chronic obstructive pulmonary disease, unspecified: Secondary | ICD-10-CM | POA: Diagnosis present

## 2023-09-06 DIAGNOSIS — Z8711 Personal history of peptic ulcer disease: Secondary | ICD-10-CM

## 2023-09-06 DIAGNOSIS — N179 Acute kidney failure, unspecified: Secondary | ICD-10-CM | POA: Diagnosis present

## 2023-09-06 DIAGNOSIS — D649 Anemia, unspecified: Principal | ICD-10-CM | POA: Diagnosis present

## 2023-09-06 DIAGNOSIS — K279 Peptic ulcer, site unspecified, unspecified as acute or chronic, without hemorrhage or perforation: Secondary | ICD-10-CM | POA: Diagnosis present

## 2023-09-06 DIAGNOSIS — E871 Hypo-osmolality and hyponatremia: Secondary | ICD-10-CM | POA: Diagnosis present

## 2023-09-06 DIAGNOSIS — Z5321 Procedure and treatment not carried out due to patient leaving prior to being seen by health care provider: Secondary | ICD-10-CM | POA: Diagnosis present

## 2023-09-06 DIAGNOSIS — F419 Anxiety disorder, unspecified: Secondary | ICD-10-CM | POA: Diagnosis present

## 2023-09-06 DIAGNOSIS — E876 Hypokalemia: Secondary | ICD-10-CM | POA: Diagnosis not present

## 2023-09-06 DIAGNOSIS — K31819 Angiodysplasia of stomach and duodenum without bleeding: Secondary | ICD-10-CM

## 2023-09-06 DIAGNOSIS — Z833 Family history of diabetes mellitus: Secondary | ICD-10-CM

## 2023-09-06 DIAGNOSIS — Z560 Unemployment, unspecified: Secondary | ICD-10-CM

## 2023-09-06 DIAGNOSIS — Z888 Allergy status to other drugs, medicaments and biological substances status: Secondary | ICD-10-CM

## 2023-09-06 DIAGNOSIS — R64 Cachexia: Secondary | ICD-10-CM | POA: Diagnosis present

## 2023-09-06 LAB — CBC WITH DIFFERENTIAL/PLATELET
Abs Immature Granulocytes: 0.07 10*3/uL (ref 0.00–0.07)
Basophils Absolute: 0 10*3/uL (ref 0.0–0.1)
Basophils Relative: 1 %
Eosinophils Absolute: 0.1 10*3/uL (ref 0.0–0.5)
Eosinophils Relative: 2 %
HCT: 13.7 % — ABNORMAL LOW (ref 36.0–46.0)
Hemoglobin: 4.3 g/dL — CL (ref 12.0–15.0)
Immature Granulocytes: 1 %
Lymphocytes Relative: 17 %
Lymphs Abs: 1.1 10*3/uL (ref 0.7–4.0)
MCH: 34.1 pg — ABNORMAL HIGH (ref 26.0–34.0)
MCHC: 31.4 g/dL (ref 30.0–36.0)
MCV: 108.7 fL — ABNORMAL HIGH (ref 80.0–100.0)
Monocytes Absolute: 0.5 10*3/uL (ref 0.1–1.0)
Monocytes Relative: 9 %
Neutro Abs: 4.6 10*3/uL (ref 1.7–7.7)
Neutrophils Relative %: 70 %
Platelets: 383 10*3/uL (ref 150–400)
RBC: 1.26 MIL/uL — ABNORMAL LOW (ref 3.87–5.11)
RDW: 16.2 % — ABNORMAL HIGH (ref 11.5–15.5)
WBC: 6.4 10*3/uL (ref 4.0–10.5)
nRBC: 0.9 % — ABNORMAL HIGH (ref 0.0–0.2)

## 2023-09-06 LAB — PREPARE RBC (CROSSMATCH)

## 2023-09-06 LAB — COMPREHENSIVE METABOLIC PANEL WITH GFR
ALT: 10 U/L (ref 0–44)
AST: 17 U/L (ref 15–41)
Albumin: 2.5 g/dL — ABNORMAL LOW (ref 3.5–5.0)
Alkaline Phosphatase: 72 U/L (ref 38–126)
Anion gap: 19 — ABNORMAL HIGH (ref 5–15)
BUN: 81 mg/dL — ABNORMAL HIGH (ref 8–23)
CO2: 13 mmol/L — ABNORMAL LOW (ref 22–32)
Calcium: 7 mg/dL — ABNORMAL LOW (ref 8.9–10.3)
Chloride: 103 mmol/L (ref 98–111)
Creatinine, Ser: 6.4 mg/dL — ABNORMAL HIGH (ref 0.44–1.00)
GFR, Estimated: 7 mL/min — ABNORMAL LOW (ref >60)
Glucose, Bld: 90 mg/dL (ref 70–99)
Potassium: 3.4 mmol/L — ABNORMAL LOW (ref 3.5–5.1)
Sodium: 135 mmol/L (ref 135–145)
Total Bilirubin: 0.5 mg/dL (ref 0.0–1.2)
Total Protein: 6.1 g/dL — ABNORMAL LOW (ref 6.5–8.1)

## 2023-09-06 LAB — TROPONIN I (HIGH SENSITIVITY)
Troponin I (High Sensitivity): 86 ng/L — ABNORMAL HIGH (ref <18)
Troponin I (High Sensitivity): 86 ng/L — ABNORMAL HIGH (ref <18)

## 2023-09-06 MED ORDER — ACETAMINOPHEN 325 MG PO TABS
650.0000 mg | ORAL_TABLET | Freq: Once | ORAL | Status: AC
  Administered 2023-09-06: 650 mg via ORAL
  Filled 2023-09-06: qty 2

## 2023-09-06 MED ORDER — FERROUS SULFATE 325 (65 FE) MG PO TABS
325.0000 mg | ORAL_TABLET | ORAL | Status: DC
  Administered 2023-09-07 – 2023-09-19 (×7): 325 mg via ORAL
  Filled 2023-09-06 (×9): qty 1

## 2023-09-06 MED ORDER — GABAPENTIN 100 MG PO CAPS
100.0000 mg | ORAL_CAPSULE | Freq: Three times a day (TID) | ORAL | Status: DC
  Administered 2023-09-06 – 2023-09-19 (×36): 100 mg via ORAL
  Filled 2023-09-06 (×36): qty 1

## 2023-09-06 MED ORDER — ALBUTEROL SULFATE (2.5 MG/3ML) 0.083% IN NEBU
2.5000 mg | INHALATION_SOLUTION | Freq: Four times a day (QID) | RESPIRATORY_TRACT | Status: DC | PRN
  Administered 2023-09-11 – 2023-09-18 (×8): 2.5 mg via RESPIRATORY_TRACT
  Filled 2023-09-06 (×8): qty 3

## 2023-09-06 MED ORDER — TORSEMIDE 20 MG PO TABS
100.0000 mg | ORAL_TABLET | Freq: Every day | ORAL | Status: DC
  Administered 2023-09-07 – 2023-09-19 (×13): 100 mg via ORAL
  Filled 2023-09-06 (×13): qty 5

## 2023-09-06 MED ORDER — SERTRALINE HCL 50 MG PO TABS
25.0000 mg | ORAL_TABLET | Freq: Every day | ORAL | Status: DC
  Administered 2023-09-07 – 2023-09-19 (×13): 25 mg via ORAL
  Filled 2023-09-06 (×13): qty 1

## 2023-09-06 MED ORDER — HYDROCODONE-ACETAMINOPHEN 5-325 MG PO TABS
1.0000 | ORAL_TABLET | Freq: Once | ORAL | Status: AC
  Administered 2023-09-06: 1 via ORAL
  Filled 2023-09-06: qty 1

## 2023-09-06 MED ORDER — SODIUM CHLORIDE 0.9% IV SOLUTION: Freq: Once | INTRAVENOUS | Status: DC

## 2023-09-06 MED ORDER — ADULT MULTIVITAMIN W/MINERALS CH
1.0000 | ORAL_TABLET | Freq: Every day | ORAL | Status: DC
  Administered 2023-09-07 – 2023-09-19 (×13): 1 via ORAL
  Filled 2023-09-06 (×13): qty 1

## 2023-09-06 MED ORDER — PANTOPRAZOLE SODIUM 40 MG IV SOLR: 40.0000 mg | Freq: Once | INTRAVENOUS | Status: DC

## 2023-09-06 MED ORDER — SODIUM CHLORIDE 0.9% IV SOLUTION: Freq: Once | INTRAVENOUS | Status: AC

## 2023-09-06 MED ORDER — AMLODIPINE BESYLATE 10 MG PO TABS
10.0000 mg | ORAL_TABLET | Freq: Every day | ORAL | Status: DC
  Administered 2023-09-07 – 2023-09-19 (×12): 10 mg via ORAL
  Filled 2023-09-06: qty 2
  Filled 2023-09-06 (×10): qty 1
  Filled 2023-09-06: qty 2
  Filled 2023-09-06: qty 1

## 2023-09-06 MED ORDER — SODIUM BICARBONATE 650 MG PO TABS
1300.0000 mg | ORAL_TABLET | Freq: Two times a day (BID) | ORAL | Status: DC
  Administered 2023-09-06 – 2023-09-15 (×17): 1300 mg via ORAL
  Filled 2023-09-06 (×17): qty 2

## 2023-09-06 MED ORDER — THIAMINE MONONITRATE 100 MG PO TABS
100.0000 mg | ORAL_TABLET | Freq: Every day | ORAL | Status: DC
  Administered 2023-09-07 – 2023-09-19 (×13): 100 mg via ORAL
  Filled 2023-09-06 (×13): qty 1

## 2023-09-06 MED ORDER — CALCITRIOL 0.25 MCG PO CAPS
0.2500 ug | ORAL_CAPSULE | Freq: Every day | ORAL | Status: DC
  Administered 2023-09-07 – 2023-09-19 (×13): 0.25 ug via ORAL
  Filled 2023-09-06 (×13): qty 1

## 2023-09-06 MED ORDER — METOPROLOL TARTRATE 5 MG/5ML IV SOLN: 5.0000 mg | Freq: Four times a day (QID) | INTRAVENOUS | Status: DC | PRN

## 2023-09-06 MED ORDER — POLYETHYLENE GLYCOL 3350 17 G PO PACK: 17.0000 g | PACK | Freq: Every day | ORAL | Status: DC | PRN

## 2023-09-06 MED ORDER — ALBUTEROL SULFATE HFA 108 (90 BASE) MCG/ACT IN AERS: 2.0000 | INHALATION_SPRAY | Freq: Four times a day (QID) | RESPIRATORY_TRACT | Status: DC | PRN

## 2023-09-06 MED ORDER — ALBUTEROL SULFATE (2.5 MG/3ML) 0.083% IN NEBU: 2.5000 mg | INHALATION_SOLUTION | Freq: Four times a day (QID) | RESPIRATORY_TRACT | Status: DC

## 2023-09-06 MED ORDER — CARVEDILOL 6.25 MG PO TABS
6.2500 mg | ORAL_TABLET | Freq: Two times a day (BID) | ORAL | Status: DC
  Administered 2023-09-07 – 2023-09-19 (×25): 6.25 mg via ORAL
  Filled 2023-09-06 (×15): qty 1
  Filled 2023-09-06 (×3): qty 2
  Filled 2023-09-06 (×8): qty 1
  Filled 2023-09-06: qty 2

## 2023-09-06 MED ORDER — ONDANSETRON HCL 4 MG PO TABS: 4.0000 mg | ORAL_TABLET | Freq: Four times a day (QID) | ORAL | Status: DC | PRN

## 2023-09-06 MED ORDER — SUCRALFATE 1 GM/10ML PO SUSP
1.0000 g | Freq: Four times a day (QID) | ORAL | Status: DC
  Administered 2023-09-06 – 2023-09-19 (×46): 1 g via ORAL
  Filled 2023-09-06 (×46): qty 10

## 2023-09-06 MED ORDER — PANTOPRAZOLE SODIUM 40 MG IV SOLR
80.0000 mg | Freq: Once | INTRAVENOUS | Status: AC
  Administered 2023-09-06: 80 mg via INTRAVENOUS
  Filled 2023-09-06: qty 20

## 2023-09-06 MED ORDER — DIPHENHYDRAMINE HCL 25 MG PO CAPS
25.0000 mg | ORAL_CAPSULE | Freq: Once | ORAL | Status: AC
  Administered 2023-09-06: 25 mg via ORAL
  Filled 2023-09-06: qty 1

## 2023-09-06 MED ORDER — BUDESON-GLYCOPYRROL-FORMOTEROL 160-9-4.8 MCG/ACT IN AERO
2.0000 | INHALATION_SPRAY | Freq: Two times a day (BID) | RESPIRATORY_TRACT | Status: DC
  Administered 2023-09-07 – 2023-09-19 (×23): 2 via RESPIRATORY_TRACT
  Filled 2023-09-06 (×2): qty 5.9

## 2023-09-06 MED ORDER — FUROSEMIDE 10 MG/ML IJ SOLN
20.0000 mg | Freq: Once | INTRAMUSCULAR | Status: AC
  Administered 2023-09-06: 20 mg via INTRAVENOUS
  Filled 2023-09-06: qty 2

## 2023-09-06 MED ORDER — SODIUM CHLORIDE 0.9% FLUSH
3.0000 mL | Freq: Two times a day (BID) | INTRAVENOUS | Status: DC
  Administered 2023-09-07 – 2023-09-19 (×22): 3 mL via INTRAVENOUS

## 2023-09-06 MED ORDER — ONDANSETRON HCL 4 MG/2ML IJ SOLN
4.0000 mg | Freq: Four times a day (QID) | INTRAMUSCULAR | Status: DC | PRN
Start: 2023-09-06 — End: 2023-09-20

## 2023-09-06 MED ORDER — ACETAMINOPHEN 325 MG PO TABS
650.0000 mg | ORAL_TABLET | Freq: Four times a day (QID) | ORAL | Status: DC | PRN
  Filled 2023-09-06 (×7): qty 2

## 2023-09-06 MED ORDER — OXYCODONE HCL 5 MG PO TABS
5.0000 mg | ORAL_TABLET | ORAL | Status: DC | PRN
  Administered 2023-09-07 – 2023-09-19 (×20): 5 mg via ORAL
  Filled 2023-09-06 (×19): qty 1

## 2023-09-06 MED ORDER — PANTOPRAZOLE SODIUM 40 MG PO TBEC
40.0000 mg | DELAYED_RELEASE_TABLET | Freq: Two times a day (BID) | ORAL | Status: DC
  Administered 2023-09-07 – 2023-09-19 (×24): 40 mg via ORAL
  Filled 2023-09-06 (×25): qty 1

## 2023-09-06 MED ORDER — TRAZODONE HCL 50 MG PO TABS
50.0000 mg | ORAL_TABLET | Freq: Every evening | ORAL | Status: DC | PRN
  Administered 2023-09-07 – 2023-09-18 (×10): 50 mg via ORAL
  Filled 2023-09-06 (×10): qty 1

## 2023-09-06 MED ORDER — FOLIC ACID 1 MG PO TABS
1.0000 mg | ORAL_TABLET | Freq: Every day | ORAL | Status: DC
  Administered 2023-09-07 – 2023-09-19 (×13): 1 mg via ORAL
  Filled 2023-09-06 (×13): qty 1

## 2023-09-06 MED ORDER — SODIUM CHLORIDE 0.9% FLUSH: 3.0000 mL | INTRAVENOUS | Status: DC | PRN

## 2023-09-06 MED ORDER — SODIUM CHLORIDE 0.9 % IV SOLN: 250.0000 mL | INTRAVENOUS | Status: AC | PRN

## 2023-09-06 MED ORDER — NICOTINE 14 MG/24HR TD PT24
14.0000 mg | MEDICATED_PATCH | Freq: Every day | TRANSDERMAL | Status: DC
  Administered 2023-09-06 – 2023-09-08 (×3): 14 mg via TRANSDERMAL
  Filled 2023-09-06 (×3): qty 1

## 2023-09-06 MED ORDER — ACETAMINOPHEN 650 MG RE SUPP: 650.0000 mg | Freq: Four times a day (QID) | RECTAL | Status: DC | PRN

## 2023-09-06 MED ORDER — ATORVASTATIN CALCIUM 10 MG PO TABS
10.0000 mg | ORAL_TABLET | Freq: Every day | ORAL | Status: DC
  Administered 2023-09-07 – 2023-09-19 (×13): 10 mg via ORAL
  Filled 2023-09-06 (×13): qty 1

## 2023-09-06 MED ORDER — ACETAMINOPHEN 325 MG PO TABS
650.0000 mg | ORAL_TABLET | Freq: Four times a day (QID) | ORAL | Status: DC | PRN
  Administered 2023-09-10 – 2023-09-17 (×7): 650 mg via ORAL

## 2023-09-06 NOTE — Assessment & Plan Note (Signed)
-  Continue PPI, sucralfate ?

## 2023-09-06 NOTE — Assessment & Plan Note (Signed)
 Patient with end-stage renal disease and chronic anemia Baseline hemoglobin is between 8 and 10 Patient has [place] history of upper GI bleeding due to GI AVMs and gastritis. Patient gives no reports of acute GI bleeding, denies hematemesis, melena however she does take iron  daily and her stools are dark. Patient's hemoglobin is acutely low at 4.3. Transfuse 3 units Trend Consider EPO

## 2023-09-06 NOTE — Assessment & Plan Note (Addendum)
 Has had this in the past and needed APC Asked EDP to consult GI to potentially see her in case this is needed again specifically if she has acute GI bleeding.

## 2023-09-06 NOTE — H&P (Addendum)
 History and Physical    PatientDESA RECH [name]:[id number] DOB: 11-Feb-1961 DOA: 09/06/2023 DOS: the patient was seen and examined on 09/06/2023 PCP: Vicci Barnie NOVAK, MD  Patient coming from: Homeless  Chief Complaint:  Chief Complaint  Patient presents with   Leg Pain   Chest Pain   Shortness of Breath   HPI: [name] is a 63 y.o. female with medical history significant of  obacco use, polysubstance use, end-stage renal disease not on dialysis due to homelessness, history of chronic GI bleeding due to AVMs, GERD,, PUD, COPD, HTN, chronic leg pain who presents to the ED stating it is time for me to get dialysis.  She reports needing dialysis but unable to get this done due to homelessness.  She has chronic anemia with a baseline hemoglobin of 8-10.  In the ED she was noted to have a hemoglobin of 4.3.  Her creatinine was 6.4, her potassium was 3.4.  The patient had a chest x-ray that showed a right pleural effusion and findings consistent with CHF.  Patient does have a history of congestive heart failure and is on beta-blockers plus diuretics for this.  Patient was started on 1 unit of packed red blood cells in the ED.  Nephrology was consulted.  The EDP was asked to consult GI as well given her history.  Patient is required APC in the past.  Patient has ongoing polysubstance use.  Patient reports sore throat for approximately last 1 month. Review of Systems: As mentioned in the history of present illness. All other systems reviewed and are negative. Past Medical History:  Diagnosis Date   Acute on chronic blood loss anemia 08/01/2022   Acute on chronic diastolic CHF (congestive heart failure) (HCC) 08/12/2022   Acute on chronic respiratory failure with hypoxia (HCC) 05/16/2023   Acute pulmonary edema (HCC) 05/16/2023   Acute seasonal allergic rhinitis due to pollen 02/22/2016   Alcohol  abuse    Allergy    Anxiety    Arteriovenous fistula infection, initial encounter (HCC)  04/16/2023   Arthritis    Asthma    Cannabis abuse    Cocaine abuse (HCC)    COPD (chronic obstructive pulmonary disease) (HCC)    COPD with acute exacerbation (HCC) 10/25/2022   COVID 05/16/2023   Depression    Drug addiction (HCC)    GERD (gastroesophageal reflux disease)    GIB (gastrointestinal bleeding) 08/19/2019   Heart murmur    Heme positive stool 08/14/2022   Homelessness    Hx of adenomatous colonic polyps    Hyperlipidemia    Hypertension    pt stated every once in a while BP will be high but has not been prescribed medication for HTN.    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) 02/22/2016   SIRS (systemic inflammatory response syndrome) (HCC) 10/25/2022   Past Surgical History:  Procedure Laterality Date   A/V FISTULAGRAM Right 01/31/2023   Procedure: A/V Fistulagram;  Surgeon: Gretta Lonni PARAS, MD;  Location: [facility];  Service: Cardiovascular;  Laterality: Right;   AV FISTULA PLACEMENT Right 08/14/2022   Procedure: RIGHT ARM BRACHIOBASILIC ATERIOVENOUS FISTULA CREATION;  Surgeon: Magda Debby SAILOR, MD;  Location: [facility] OR;  Service:  Vascular;  Laterality: Right;   BASCILIC VEIN TRANSPOSITION Right 11/13/2022   Procedure: RIGHT ARM SECOND STAGE BASILIC VEIN TRANSPOSITION;  Surgeon: Magda Debby SAILOR, MD;  Location: [facility] OR;  Service: Vascular;  Laterality: Right;   BIOPSY  01/01/2019   Procedure: BIOPSY;  Surgeon: Albertus Gordy HERO, MD;  Location: [facility] ENDOSCOPY;  Service: Endoscopy;;   BIOPSY  11/17/2021   Procedure: BIOPSY;  Surgeon: Charlanne Groom, MD;  Location: [facility] ENDOSCOPY;  Service: Gastroenterology;;   CESAREAN SECTION  1989   COLONOSCOPY  11/07/2020   2018   CYSTOSCOPY W/ URETERAL STENT  PLACEMENT Left 08/01/2018   Procedure: CYSTOSCOPY WITH RETROGRADE PYELOGRAM/URETERAL STENT PLACEMENT;  Surgeon: Carolee Sherwood JONETTA DOUGLAS, MD;  Location: [facility] ORS;  Service: Urology;  Laterality: Left;   CYSTOSCOPY WITH RETROGRADE PYELOGRAM, URETEROSCOPY AND STENT PLACEMENT Left 01/15/2019   Procedure: CYSTOSCOPY WITH RETROGRADE PYELOGRAM, URETEROSCOPY AND STENT PLACEMENT;  Surgeon: Carolee Sherwood JONETTA DOUGLAS, MD;  Location: [facility] ORS;  Service: Urology;  Laterality: Left;   ENTEROSCOPY N/A 08/16/2022   Procedure: ENTEROSCOPY;  Surgeon: Wilhelmenia Aloha Raddle., MD;  Location: [facility] ENDOSCOPY;  Service: Gastroenterology;  Laterality: N/A;   ENTEROSCOPY N/A 10/31/2022   Procedure: ENTEROSCOPY;  Surgeon: Albertus Gordy HERO, MD;  Location: [facility] ENDOSCOPY;  Service: Gastroenterology;  Laterality: N/A;   ENTEROSCOPY N/A 05/09/2023   Procedure: ENTEROSCOPY;  Surgeon: Federico Rosario BROCKS, MD;  Location: [facility] ENDOSCOPY;  Service: Gastroenterology;  Laterality: N/A;   ESOPHAGOGASTRODUODENOSCOPY (EGD) WITH PROPOFOL  N/A 01/01/2019   Procedure: ESOPHAGOGASTRODUODENOSCOPY (EGD) WITH PROPOFOL ;  Surgeon: Albertus Gordy HERO, MD;  Location: [facility] ENDOSCOPY;  Service: Endoscopy;  Laterality: N/A;   ESOPHAGOGASTRODUODENOSCOPY (EGD) WITH PROPOFOL  N/A 08/21/2019   Procedure: ESOPHAGOGASTRODUODENOSCOPY (EGD) WITH PROPOFOL ;  Surgeon: Shila Gustav GAILS, MD;  Location: [facility] ENDOSCOPY;  Service: Endoscopy;  Laterality: N/A;   ESOPHAGOGASTRODUODENOSCOPY (EGD) WITH PROPOFOL  N/A 11/17/2021   Procedure: ESOPHAGOGASTRODUODENOSCOPY (EGD) WITH PROPOFOL ;  Surgeon: Charlanne Groom, MD;  Location: [facility] ENDOSCOPY;  Service: Gastroenterology;  Laterality: N/A;   FRACTURE SURGERY Left 2011   arm   HEMOSTASIS CLIP PLACEMENT  08/16/2022   Procedure: HEMOSTASIS CLIP PLACEMENT;  Surgeon: Wilhelmenia Aloha Raddle., MD;  Location: [facility] ENDOSCOPY;  Service: Gastroenterology;;   HOT HEMOSTASIS N/A 08/16/2022   Procedure: HOT HEMOSTASIS (ARGON PLASMA COAGULATION/BICAP);  Surgeon: Wilhelmenia Aloha Raddle., MD;  Location: [facility] ENDOSCOPY;  Service: Gastroenterology;  Laterality: N/A;   HOT HEMOSTASIS N/A 10/31/2022   Procedure: HOT HEMOSTASIS (ARGON PLASMA COAGULATION/BICAP);  Surgeon: Albertus Gordy HERO, MD;  Location: [facility] ENDOSCOPY;  Service: Gastroenterology;  Laterality: N/A;   HOT HEMOSTASIS N/A 05/09/2023   Procedure: HOT HEMOSTASIS (ARGON PLASMA COAGULATION/BICAP);  Surgeon: Federico Rosario BROCKS, MD;  Location: [facility] ENDOSCOPY;  Service: Gastroenterology;  Laterality: N/A;   INSERTION OF DIALYSIS CATHETER Right 08/14/2022   Procedure: INSERTION OF TUNNELED DIALYSIS CATHETER USING PALINDROME CATHETER KIT 19CM;  Surgeon: Magda Debby SAILOR, MD;  Location: [facility] OR;  Service: Vascular;  Laterality: Right;   POLYPECTOMY  08/16/2022   Procedure: POLYPECTOMY;  Surgeon: Wilhelmenia Aloha Raddle., MD;  Location: [facility] ENDOSCOPY;  Service: Gastroenterology;;   SUBMUCOSAL TATTOO INJECTION  08/16/2022   Procedure: SUBMUCOSAL TATTOO INJECTION;  Surgeon: Wilhelmenia Aloha Raddle., MD;  Location: [facility] ENDOSCOPY;  Service: Gastroenterology;;   UPPER GASTROINTESTINAL ENDOSCOPY     Social History:  reports that she has been smoking cigarettes. She has been exposed to tobacco smoke. She has never used smokeless tobacco. She reports that she does not currently use alcohol . She reports current drug use. Drug: Marijuana.  Allergies  Allergen  Reactions   Zestril  [Lisinopril ] Other (See Comments)    Hyperkalemia    Family History  Problem Relation Age of Onset   Diabetes Father    Colon cancer Neg Hx    Esophageal cancer Neg Hx    Rectal cancer Neg Hx    Stomach cancer Neg Hx     Prior to Admission medications   Medication Sig Start Date End Date Taking? Authorizing Provider  acetaminophen  (TYLENOL ) 325 MG tablet Take 2 tablets (650 mg total) by mouth every 6 (six) hours as needed for mild pain (pain score 1-3) (or Fever >/= 101). 07/01/23   Danton Reyes DASEN, MD  albuterol  (VENTOLIN  HFA) 108 (90 Base) MCG/ACT inhaler Inhale 2 puffs  into the lungs every 6 (six) hours as needed for wheezing or shortness of breath. 07/26/23   Gherghe, Costin M, MD  amLODipine  (NORVASC ) 10 MG tablet Take 1 tablet (10 mg total) by mouth daily. 07/26/23   Gherghe, Costin M, MD  atorvastatin  (LIPITOR) 10 MG tablet Take 1 tablet (10 mg total) by mouth daily. 07/26/23   Gherghe, Costin M, MD  benzonatate  (TESSALON ) 100 MG capsule Take 1 capsule (100 mg total) by mouth 3 (three) times daily as needed for cough. 07/26/23   Gherghe, Costin M, MD  calcitRIOL  (ROCALTROL ) 0.25 MCG capsule Take 1 capsule (0.25 mcg total) by mouth daily. 07/26/23   Gherghe, Costin M, MD  carvedilol  (COREG ) 6.25 MG tablet Take 1 tablet (6.25 mg total) by mouth 2 (two) times daily with a meal. 07/26/23   Trixie Nilda HERO, MD  ferrous sulfate  325 (65 FE) MG tablet Take 1 tablet (325 mg total) by mouth daily with breakfast. 07/26/23   Gherghe, Costin M, MD  gabapentin  (NEURONTIN ) 100 MG capsule Take 1 capsule (100 mg total) by mouth 3 (three) times daily. 07/26/23 10/24/23  Gherghe, Costin M, MD  nicotine  (NICODERM CQ  - DOSED IN MG/24 HOURS) 21 mg/24hr patch Place 1 patch (21 mg total) onto the skin daily. 07/27/23   Gherghe, Costin M, MD  oxyCODONE  (OXY IR/ROXICODONE ) 5 MG immediate release tablet Take 1 tablet (5 mg total) by mouth every 4 (four) hours as needed for severe pain (pain score 7-10). 07/26/23   Gherghe, Costin M, MD  pantoprazole  (PROTONIX ) 40 MG tablet Take 1 tablet (40 mg total) by mouth 2 (two) times daily. 07/26/23 08/25/23  Gherghe, Costin M, MD  sertraline  (ZOLOFT ) 25 MG tablet Take 1 tablet (25 mg total) by mouth daily. 07/26/23   Gherghe, Costin M, MD  sodium bicarbonate  650 MG tablet Take 2 tablets (1,300 mg total) by mouth 2 (two) times daily. 07/26/23 10/24/23  Trixie Nilda HERO, MD  sucralfate  (CARAFATE ) 1 GM/10ML suspension Take 10 mLs (1 g total) by mouth 4 (four) times daily for 21 days. 07/26/23 08/16/23  Gherghe, Costin M, MD  torsemide  (DEMADEX ) 100 MG tablet Take 1  tablet (100 mg total) by mouth daily. 07/26/23   Gherghe, Costin M, MD  traZODone  (DESYREL ) 50 MG tablet Take 1 tablet (50 mg total) by mouth at bedtime as needed for sleep. 07/26/23   Trixie Nilda HERO, MD  TRELEGY ELLIPTA  100-62.5-25 MCG/ACT AEPB Inhale 1 puff into the lungs daily. 07/26/23   Trixie Nilda HERO, MD    Physical Exam: Vitals:   09/06/23 1930 09/06/23 1945 09/06/23 1951 09/06/23 2009  BP: (!) 173/78 (!) 150/68 (!) 150/68 (!) 173/77  Pulse:   76 77  Resp: 19 19 16 15   Temp:   97.8 F (36.6  C) 97.9 F (36.6 C)  TempSrc:   Oral Oral  SpO2:   100% 100%   Physical Examination: General appearance - chronically ill appearing and cachectic Chest - clear to auscultation, no wheezes, rales or rhonchi, symmetric air entry Heart - normal rate, regular rhythm, normal S1, S2, no murmurs, rubs, clicks or gallops Abdomen - soft, nontender, nondistended, no masses or organomegaly Extremities - peripheral pulses normal, no pedal edema, no clubbing or cyanosis Patient has right AV fistula with good thrill  Data Reviewed: Results for orders placed or performed during the hospital encounter of 09/06/23 (from the past 24 hours)  CBC with Differential     Status: Abnormal   Collection Time: 09/06/23  3:43 PM  Result Value Ref Range   WBC 6.4 4.0 - 10.5 K/uL   RBC 1.26 (L) 3.87 - 5.11 MIL/uL   Hemoglobin 4.3 (LL) 12.0 - 15.0 g/dL   HCT 86.2 (L) 63.9 - 53.9 %   MCV 108.7 (H) 80.0 - 100.0 fL   MCH 34.1 (H) 26.0 - 34.0 pg   MCHC 31.4 30.0 - 36.0 g/dL   RDW 83.7 (H) 88.4 - 84.4 %   Platelets 383 150 - 400 K/uL   nRBC 0.9 (H) 0.0 - 0.2 %   Neutrophils Relative % 70 %   Neutro Abs 4.6 1.7 - 7.7 K/uL   Lymphocytes Relative 17 %   Lymphs Abs 1.1 0.7 - 4.0 K/uL   Monocytes Relative 9 %   Monocytes Absolute 0.5 0.1 - 1.0 K/uL   Eosinophils Relative 2 %   Eosinophils Absolute 0.1 0.0 - 0.5 K/uL   Basophils Relative 1 %   Basophils Absolute 0.0 0.0 - 0.1 K/uL   Immature Granulocytes 1 %    Abs Immature Granulocytes 0.07 0.00 - 0.07 K/uL  Comprehensive metabolic panel     Status: Abnormal   Collection Time: 09/06/23  3:43 PM  Result Value Ref Range   Sodium 135 135 - 145 mmol/L   Potassium 3.4 (L) 3.5 - 5.1 mmol/L   Chloride 103 98 - 111 mmol/L   CO2 13 (L) 22 - 32 mmol/L   Glucose, Bld 90 70 - 99 mg/dL   BUN 81 (H) 8 - 23 mg/dL   Creatinine, Ser 3.59 (H) 0.44 - 1.00 mg/dL   Calcium  7.0 (L) 8.9 - 10.3 mg/dL   Total Protein 6.1 (L) 6.5 - 8.1 g/dL   Albumin  2.5 (L) 3.5 - 5.0 g/dL   AST 17 15 - 41 U/L   ALT 10 0 - 44 U/L   Alkaline Phosphatase 72 38 - 126 U/L   Total Bilirubin 0.5 0.0 - 1.2 mg/dL   GFR, Estimated 7 (L) >60 mL/min   Anion gap 19 (H) 5 - 15  Troponin I (High Sensitivity)     Status: Abnormal   Collection Time: 09/06/23  3:43 PM  Result Value Ref Range   Troponin I (High Sensitivity) 86 (H) <18 ng/L  Type and screen [facility]     Status: None (Preliminary result)   Collection Time: 09/06/23  5:54 PM  Result Value Ref Range   ABO/RH(D) A POS    Antibody Screen NEG    Sample Expiration 09/09/2023,2359    Unit Number [id number]    Blood Component Type RED CELLS,LR    Unit division 00    Status of Unit ALLOCATED    Transfusion Status OK TO TRANSFUSE    Crossmatch Result Compatible    Unit Number [id number]  Blood Component Type RBC LR PHER2    Unit division 00    Status of Unit ISSUED    Transfusion Status OK TO TRANSFUSE    Crossmatch Result Compatible    Unit Number [id number]    Blood Component Type RED CELLS,LR    Unit division 00    Status of Unit ISSUED    Transfusion Status OK TO TRANSFUSE    Crossmatch Result      Compatible Performed at [facility], [address], [place], [place] [address]    Unit Number [id number]    Blood Component Type RED CELLS,LR    Unit division 00    Status of Unit ALLOCATED    Transfusion Status OK TO TRANSFUSE    Crossmatch Result Compatible   Troponin I (High  Sensitivity)     Status: Abnormal   Collection Time: 09/06/23  5:55 PM  Result Value Ref Range   Troponin I (High Sensitivity) 86 (H) <18 ng/L  Prepare RBC (crossmatch)     Status: None   Collection Time: 09/06/23  6:00 PM  Result Value Ref Range   Order Confirmation      ORDER PROCESSED BY BLOOD BANK Performed at [facility], [address], [place], [place] [address]   Prepare RBC (crossmatch)     Status: None   Collection Time: 09/06/23  8:54 PM  Result Value Ref Range   Order Confirmation      ORDER PROCESSED BY BLOOD BANK Performed at [facility], [address], [place], [place] [address]    DG Chest Portable 1 View Result Date: 09/06/2023 CLINICAL DATA:  Shortness of breath with chest EXAM: PORTABLE CHEST 1 VIEW COMPARISON:  Chest x-ray 07/23/2023 FINDINGS: The heart is enlarged, unchanged. There is a moderate right pleural effusion which has increased. Central interstitial prominence is seen bilaterally, increased from prior. No pneumothorax. No acute fracture. IMPRESSION: 1. Increasing moderate right pleural effusion. 2. Increased central interstitial prominence, likely edema. Electronically Signed   By: Greig Pique M.D.   On: 09/06/2023 19:36     Assessment and Plan: * Acute blood loss anemia Hemoglobin down 4.3.  She will get blood.  Acute on chronic anemia Patient with end-stage renal disease and chronic anemia Baseline hemoglobin is between 8 and 10 Patient has Poli history of upper GI bleeding due to GI AVMs and gastritis. Patient gives no reports of acute GI bleeding, denies hematemesis, melena however she does take iron  daily and her stools are dark. Patient's hemoglobin is acutely low at 4.3. Transfuse 3 units Trend Consider EPO  Chronic diastolic CHF (congestive heart failure) (HCC) Continue blood pressure control Demadex  daily Currently with right pleural effusion and findings of CHF.  She will get significant volume with blood transfusion  and will need some increased diuresis. This will be provided with additional Lasix .  ESRD on dialysis [facility]) Patient does have end-stage renal disease however due to her homelessness she is unable to get chronic dialysis.  She continues to make urine. Nephrology has been consulted The patient reports getting dialyzed occasionally when she feels like she needs it.  Her BUN is up to 81 but otherwise has no other acute need for dialysis at this time.   Essential hypertension Continue amlodipine , carvedilol   COPD (chronic obstructive pulmonary disease) (HCC) Continue inhalers  Anxiety and depression Continue sertraline , trazodone   Gastric AVM Has had this in the past and needed APC Asked EDP to consult GI to potentially see  her in case this is needed again specifically if she has acute GI bleeding.  Hyperlipidemia Low-cholesterol diet  GERD (gastroesophageal reflux disease) Continue PPI, sucralfate   Tobacco abuse Patient reports cutting down on smoking.  She was offered and accepted nicotine  patch.  Polysubstance abuse [facility]) Patient continues to use cocaine sporadically.  She reports last use was approximately 4 to 5 days ago.  She denies any IV drug use.      Advance Care Planning:   Code Status: Prior DNR  Consults: Nephrology, gastroenterology  Family Communication: Patient at bedside  Severity of Illness: The appropriate patient status for this patient is INPATIENT. Inpatient status is judged to be reasonable and necessary in order to provide the required intensity of service to ensure the patient's safety. The patient's presenting symptoms, physical exam findings, and initial radiographic and laboratory data in the context of their chronic comorbidities is felt to place them at high risk for further clinical deterioration. Furthermore, it is not anticipated that the patient will be medically stable for discharge from the hospital within 2 midnights of admission.   * I  certify that at the point of admission it is my clinical judgment that the patient will require inpatient hospital care spanning beyond 2 midnights from the point of admission due to high intensity of service, high risk for further deterioration and high frequency of surveillance required.*  Author: Glenys GORMAN Birk, MD 09/06/2023 8:21 PM  For on call review [email or web address].

## 2023-09-06 NOTE — Assessment & Plan Note (Signed)
 Hemoglobin down 4.3.  She will get blood.

## 2023-09-06 NOTE — Assessment & Plan Note (Addendum)
-   Continue sertraline, trazodone

## 2023-09-06 NOTE — Assessment & Plan Note (Signed)
Low cholesterol diet

## 2023-09-06 NOTE — Assessment & Plan Note (Signed)
 Continue inhalers

## 2023-09-06 NOTE — ED Notes (Signed)
Patient placed onto cardiac monitor

## 2023-09-06 NOTE — ED Triage Notes (Signed)
 Pt bib ems from home c.o chronic bilateral leg pain, chest pain and sob. Pt has not received dialysis in several months and states she thinks she needs it now because my body gives me signs.

## 2023-09-06 NOTE — Assessment & Plan Note (Signed)
-  Continue amlodipine, carvedilol

## 2023-09-06 NOTE — ED Provider Notes (Signed)
 [place] [facility] AT [facility] Provider Note   CSN: [id number] Arrival date & time: 09/06/23  1529     Patient presents with: Leg Pain, Chest Pain, and Shortness of Breath   [name] is a 63 y.o. female.   63 y.o female with  a PMH of CKD non compliant with dialysis presents to the ED via EMS with a chief complaint of leg pain, chest pain and sob. She states she has not had dialysis in approximately 3 months.  She reports that now she is having some chest pain, overall body aches, overall recurrent bilateral leg pain.  She is currently on gabapentin  400 mg 3 times a day, reports this has not been doing anything for her.  She also endorses tobacco use with 1 pack a day.  Patient is homeless at baseline.  She has not been to her dialysis center in several months and states she agreed to this with her nephrologist and now she gets dialysis when my body tells me its time. No fever, no nausea, vomiting.   The history is provided by the patient.  Leg Pain Location:  Leg Associated symptoms: no back pain and no fever   Chest Pain Associated symptoms: shortness of breath   Associated symptoms: no abdominal pain, no back pain, no fever, no nausea and no vomiting   Shortness of Breath Associated symptoms: chest pain   Associated symptoms: no abdominal pain, no fever, no sore throat and no vomiting        Prior to Admission medications   Medication Sig Start Date End Date Taking? Authorizing Provider  acetaminophen  (TYLENOL ) 325 MG tablet Take 2 tablets (650 mg total) by mouth every 6 (six) hours as needed for mild pain (pain score 1-3) (or Fever >/= 101). 07/01/23   Danton Reyes DASEN, MD  albuterol  (VENTOLIN  HFA) 108 [phone] Base) MCG/ACT inhaler Inhale 2 puffs into the lungs every 6 (six) hours as needed for wheezing or shortness of breath. 07/26/23   Gherghe, Costin M, MD  amLODipine  (NORVASC ) 10 MG tablet Take 1 tablet (10 mg total) by mouth daily. 07/26/23    Gherghe, Costin M, MD  atorvastatin  (LIPITOR) 10 MG tablet Take 1 tablet (10 mg total) by mouth daily. 07/26/23   Gherghe, Costin M, MD  benzonatate  (TESSALON ) 100 MG capsule Take 1 capsule (100 mg total) by mouth 3 (three) times daily as needed for cough. 07/26/23   Gherghe, Costin M, MD  calcitRIOL  (ROCALTROL ) 0.25 MCG capsule Take 1 capsule (0.25 mcg total) by mouth daily. 07/26/23   Gherghe, Costin M, MD  carvedilol  (COREG ) 6.25 MG tablet Take 1 tablet (6.25 mg total) by mouth 2 (two) times daily with a meal. 07/26/23   Trixie Nilda HERO, MD  ferrous sulfate  325 (65 FE) MG tablet Take 1 tablet (325 mg total) by mouth daily with breakfast. 07/26/23   Gherghe, Costin M, MD  gabapentin  (NEURONTIN ) 100 MG capsule Take 1 capsule (100 mg total) by mouth 3 (three) times daily. 07/26/23 10/24/23  Gherghe, Costin M, MD  nicotine  (NICODERM CQ  - DOSED IN MG/24 HOURS) 21 mg/24hr patch Place 1 patch (21 mg total) onto the skin daily. 07/27/23   Gherghe, Costin M, MD  oxyCODONE  (OXY IR/ROXICODONE ) 5 MG immediate release tablet Take 1 tablet (5 mg total) by mouth every 4 (four) hours as needed for severe pain (pain score 7-10). 07/26/23   Gherghe, Costin M, MD  pantoprazole  (PROTONIX ) 40 MG tablet Take 1 tablet (40  mg total) by mouth 2 (two) times daily. 07/26/23 08/25/23  Gherghe, Costin M, MD  sertraline  (ZOLOFT ) 25 MG tablet Take 1 tablet (25 mg total) by mouth daily. 07/26/23   Gherghe, Costin M, MD  sodium bicarbonate  650 MG tablet Take 2 tablets (1,300 mg total) by mouth 2 (two) times daily. 07/26/23 10/24/23  Trixie Nilda HERO, MD  sucralfate  (CARAFATE ) 1 GM/10ML suspension Take 10 mLs (1 g total) by mouth 4 (four) times daily for 21 days. 07/26/23 08/16/23  Gherghe, Costin M, MD  torsemide  (DEMADEX ) 100 MG tablet Take 1 tablet (100 mg total) by mouth daily. 07/26/23   Gherghe, Costin M, MD  traZODone  (DESYREL ) 50 MG tablet Take 1 tablet (50 mg total) by mouth at bedtime as needed for sleep. 07/26/23   Trixie Nilda HERO, MD   TRELEGY ELLIPTA  100-62.5-25 MCG/ACT AEPB Inhale 1 puff into the lungs daily. 07/26/23   Gherghe, Costin M, MD    Allergies: Zestril  [lisinopril ]    Review of Systems  Constitutional:  Negative for chills and fever.  HENT:  Negative for sore throat.   Respiratory:  Positive for shortness of breath.   Cardiovascular:  Positive for chest pain.  Gastrointestinal:  Negative for abdominal pain, nausea and vomiting.  Genitourinary:  Negative for flank pain.  Musculoskeletal:  Positive for myalgias. Negative for back pain.  All other systems reviewed and are negative.   Updated Vital Signs BP (!) 173/77   Pulse 77   Temp 97.9 F (36.6 C) (Oral)   Resp 15   SpO2 100%   Physical Exam Vitals and nursing note reviewed.  Constitutional:      Comments: Disheveled appearance.   HENT:     Head: Normocephalic and atraumatic.   Cardiovascular:     Rate and Rhythm: Normal rate.  Pulmonary:     Effort: Pulmonary effort is normal.     Breath sounds: Examination of the right-lower field reveals decreased breath sounds. Examination of the left-lower field reveals decreased breath sounds. Decreased breath sounds present.  Chest:     Chest wall: No tenderness.  Abdominal:     Palpations: Abdomen is soft.     Tenderness: There is no abdominal tenderness.   Musculoskeletal:     Right lower leg: No edema.     Left lower leg: No edema.   Skin:    General: Skin is warm and dry.   Neurological:     Mental Status: She is alert and oriented to person, place, and time.     (all labs ordered are listed, but only abnormal results are displayed) Labs Reviewed  CBC WITH DIFFERENTIAL/PLATELET - Abnormal; Notable for the following components:      Result Value   RBC 1.26 (*)    Hemoglobin 4.3 (*)    HCT 13.7 (*)    MCV 108.7 (*)    MCH 34.1 (*)    RDW 16.2 (*)    nRBC 0.9 (*)    All other components within normal limits  COMPREHENSIVE METABOLIC PANEL WITH GFR - Abnormal; Notable for the  following components:   Potassium 3.4 (*)    CO2 13 (*)    BUN 81 (*)    Creatinine, Ser 6.40 (*)    Calcium  7.0 (*)    Total Protein 6.1 (*)    Albumin  2.5 (*)    GFR, Estimated 7 (*)    Anion gap 19 (*)    All other components within normal limits  TROPONIN I (HIGH SENSITIVITY) -  Abnormal; Notable for the following components:   Troponin I (High Sensitivity) 86 (*)    All other components within normal limits  TROPONIN I (HIGH SENSITIVITY) - Abnormal; Notable for the following components:   Troponin I (High Sensitivity) 86 (*)    All other components within normal limits  PREPARE RBC (CROSSMATCH)  TYPE AND SCREEN    EKG: None  Radiology: DG Chest Portable 1 View Result Date: 09/06/2023 CLINICAL DATA:  Shortness of breath with chest EXAM: PORTABLE CHEST 1 VIEW COMPARISON:  Chest x-ray 07/23/2023 FINDINGS: The heart is enlarged, unchanged. There is a moderate right pleural effusion which has increased. Central interstitial prominence is seen bilaterally, increased from prior. No pneumothorax. No acute fracture. IMPRESSION: 1. Increasing moderate right pleural effusion. 2. Increased central interstitial prominence, likely edema. Electronically Signed   By: Greig Pique M.D.   On: 09/06/2023 19:36    .Critical Care  Performed by: Chantale Leugers, PA-C Authorized by: Cola Gane, PA-C   Critical care provider statement:    Critical care was necessary to treat or prevent imminent or life-threatening deterioration of the following conditions:  Circulatory failure   Critical care was time spent personally by me on the following activities:  Discussions with consultants, evaluation of patient's response to treatment and examination of patient   Care discussed with: admitting provider      Medications Ordered in the ED  0.9 %  sodium chloride  infusion (Manually program via Guardrails IV Fluids) (has no administration in time range)  HYDROcodone -acetaminophen  (NORCO/VICODIN) 5-325 MG  per tablet 1 tablet (1 tablet Oral Given 09/06/23 1855)  pantoprazole  (PROTONIX ) injection 80 mg (80 mg Intravenous Given 09/06/23 1929)    Clinical Course as of 09/06/23 2019  Sat Sep 06, 2023  1742 Hemoglobin(!!): 4.3 [JS]  1807 Creatinine(!): 6.40 Slightly elevated from prior [JS]  1807 BUN(!): 81 [JS]  1808 Troponin I (High Sensitivity)(!): 86 Elevated from her baseline [JS]    Clinical Course User Index [JS] Kamisha Ell, PA-C                               Medical Decision Making Amount and/or Complexity of Data Reviewed Labs: ordered. Decision-making details documented in ED Course. Radiology: ordered.  Risk Prescription drug management.   This patient presents to the ED for concern of chest pain, leg pain , sob, this involves a number of treatment options, and is a complaint that carries with it a high risk of complications and morbidity.  The differential diagnosis includes ACS, PE, anemia versus infection.    Co morbidities: Discussed in HPI   Brief History:  See HPI.   EMR reviewed including pt PMHx, past surgical history and past visits to ER.   See HPI for more details   Lab Tests:  I ordered and independently interpreted labs.  The pertinent results include:    CBC with no leukocytosis, hemoglobin is critically low at 4.3, started transfusion.  CMP with slight decrease in potassium, creatinine is worsened at her baseline at 6.4 and BUN is elevated at 81.   Imaging Studies:  Xray of the chest showed:  IMPRESSION:  1. Increasing moderate right pleural effusion.  2. Increased central interstitial prominence, likely edema.      Electronically Signed   Medicines ordered:  I ordered medication including protonix   for upper GI bleed Reevaluation of the patient after these medicines showed that the patient improved I have reviewed the  patients home medicines and have made adjustments as needed   Critical Interventions:   Patient had transfusion  ordered due to critical hemoglobin of 4.3   Consults:  I requested consultation with nephrology,  and discussed lab and imaging findings as well as pertinent plan - they recommend: Made aware of patient, will need dialysis at some point during admission.   Reevaluation:  After the interventions noted above I re-evaluated patient and found that they have :stayed the same   Social Determinants of Health:  The patient's social determinants of health were a factor in the care of this patient  Problem List / ED Course:  Patient here today for chest pain, shortness of breath, chronic leg pain.  She is a dialysis patient who has not been dialyzed in approximately 3 months, reports she does not get dialysis unless my body tells me I need to .  She is here today, close in addition she is feeling some shortness of breath, feeling winded along with some chest pressure when she moves around.  Blood work remarkable for the CBC with a hemoglobin of 4.3, according to her record she does have a prior history of upper GI bleeds, given Protonix  80 mg while in the ED.  CMP remarkable for low potassium, creatinine is worsened than her baseline, nephrology was consulted and they will consult on patient while she is in the hospital likely will need dialysis.  Troponin is elevated, suspect likely due to renal demand.  X-ray does show some pleural effusions along with some congestion, however she does not appear fluid overloaded on exam. I do think that ultimately patient would benefit from transfusion, admission for further management of her ongoing complaints at this time. She was also given Norco for her chronic leg pain, is currently on gabapentin  400 mg 3 times daily, reports were last taken this morning.   Dispostion:  After consideration of the diagnostic results and the patients response to treatment, I feel that the patent would benefit from admission for further management of her symptomatic anemia,  need for dialysis.    Portions of this note were generated with Scientist, clinical (histocompatibility and immunogenetics). Dictation errors may occur despite best attempts at proofreading.   Final diagnoses:  Low hemoglobin  Chest pain, unspecified type    ED Discharge Orders     None          Maureen Broad, PA-C 09/06/23 2019    Freddi Hamilton, MD 09/11/23 [phone]

## 2023-09-06 NOTE — Assessment & Plan Note (Signed)
 Patient reports cutting down on smoking.  She was offered and accepted nicotine  patch.

## 2023-09-06 NOTE — Assessment & Plan Note (Signed)
 Patient continues to use cocaine sporadically.  She reports last use was approximately 4 to 5 days ago.  She denies any IV drug use.

## 2023-09-06 NOTE — Assessment & Plan Note (Signed)
 Continue blood pressure control Demadex  daily Currently with right pleural effusion and findings of CHF.  She will get significant volume with blood transfusion and will need some increased diuresis. This will be provided with additional Lasix .

## 2023-09-06 NOTE — Assessment & Plan Note (Signed)
 Patient does have end-stage renal disease however due to her homelessness she is unable to get chronic dialysis.  She continues to make urine. Nephrology has been consulted The patient reports getting dialyzed occasionally when she feels like she needs it.  Her BUN is up to 81 but otherwise has no other acute need for dialysis at this time.

## 2023-09-07 DIAGNOSIS — D62 Acute posthemorrhagic anemia: Secondary | ICD-10-CM | POA: Diagnosis not present

## 2023-09-07 DIAGNOSIS — F1410 Cocaine abuse, uncomplicated: Secondary | ICD-10-CM

## 2023-09-07 DIAGNOSIS — I509 Heart failure, unspecified: Secondary | ICD-10-CM

## 2023-09-07 DIAGNOSIS — Z91199 Patient's noncompliance with other medical treatment and regimen due to unspecified reason: Secondary | ICD-10-CM

## 2023-09-07 LAB — COMPREHENSIVE METABOLIC PANEL WITH GFR
ALT: 9 U/L (ref 0–44)
AST: 15 U/L (ref 15–41)
Albumin: 2.6 g/dL — ABNORMAL LOW (ref 3.5–5.0)
Alkaline Phosphatase: 81 U/L (ref 38–126)
Anion gap: 17 — ABNORMAL HIGH (ref 5–15)
BUN: 78 mg/dL — ABNORMAL HIGH (ref 8–23)
CO2: 13 mmol/L — ABNORMAL LOW (ref 22–32)
Calcium: 6.9 mg/dL — ABNORMAL LOW (ref 8.9–10.3)
Chloride: 104 mmol/L (ref 98–111)
Creatinine, Ser: 6.15 mg/dL — ABNORMAL HIGH (ref 0.44–1.00)
GFR, Estimated: 7 mL/min — ABNORMAL LOW (ref >60)
Glucose, Bld: 95 mg/dL (ref 70–99)
Potassium: 3.6 mmol/L (ref 3.5–5.1)
Sodium: 134 mmol/L — ABNORMAL LOW (ref 135–145)
Total Bilirubin: 0.5 mg/dL (ref 0.0–1.2)
Total Protein: 6.1 g/dL — ABNORMAL LOW (ref 6.5–8.1)

## 2023-09-07 LAB — CBC
HCT: 26.7 % — ABNORMAL LOW (ref 36.0–46.0)
Hemoglobin: 8.9 g/dL — ABNORMAL LOW (ref 12.0–15.0)
MCH: 31.4 pg (ref 26.0–34.0)
MCHC: 33.3 g/dL (ref 30.0–36.0)
MCV: 94.3 fL (ref 80.0–100.0)
Platelets: 380 10*3/uL (ref 150–400)
RBC: 2.83 MIL/uL — ABNORMAL LOW (ref 3.87–5.11)
RDW: 19.3 % — ABNORMAL HIGH (ref 11.5–15.5)
WBC: 7.2 10*3/uL (ref 4.0–10.5)
nRBC: 0.7 % — ABNORMAL HIGH (ref 0.0–0.2)

## 2023-09-07 LAB — HEPATITIS B SURFACE ANTIGEN: Hepatitis B Surface Ag: NONREACTIVE

## 2023-09-07 LAB — TSH: TSH: 1.733 u[IU]/mL (ref 0.350–4.500)

## 2023-09-07 LAB — BRAIN NATRIURETIC PEPTIDE: B Natriuretic Peptide: 1183.8 pg/mL — ABNORMAL HIGH (ref 0.0–100.0)

## 2023-09-07 MED ORDER — DARBEPOETIN ALFA 100 MCG/0.5ML IJ SOSY
100.0000 ug | PREFILLED_SYRINGE | Freq: Once | INTRAMUSCULAR | Status: AC
  Administered 2023-09-07: 100 ug via SUBCUTANEOUS
  Filled 2023-09-07: qty 0.5

## 2023-09-07 MED ORDER — CHLORHEXIDINE GLUCONATE CLOTH 2 % EX PADS
6.0000 | MEDICATED_PAD | Freq: Every day | CUTANEOUS | Status: DC
Start: 2023-09-08 — End: 2023-09-14
  Administered 2023-09-08 – 2023-09-14 (×6): 6 via TOPICAL

## 2023-09-07 NOTE — Consult Note (Addendum)
 [facility] Renal Consultation Note    Indication for Consultation:  Management of ESRD/hemodialysis, anemia, hypertension/volume, and secondary hyperparathyroidism.  HPI: [name] is a 63 y.o. female with ESRD (although some residual renal function), HTN, COPD/tobacco use, homelessness, and Hx GIB (gastric AVMs) who is being admitted with symptomatic anemia.  Despite numerous attempts, she does not have an outpatient dialysis unit. Comes to [facility] when I feel like I need it. Last admit was 5/7-5/10/25. Nephrology consulted but she declined HD at that time. Her last dialysis was during prior admit 4/8-4/15/25 - dialyzed twice on 4/9 and 4/10.  Presented to ED via EMS last night with CP, dyspnea, and B leg pain and weakness. Says legs keep giving out leading to many falls. In ED, she was hypertensive but not hypoxic. Labs showed Na 135, K 3.4, CO2 13, BUN 81, Ca 7, Alb 2.5, Trp 86 -> 86, WBC 6.4, Hgb 4.3, Plts 383. She was transfused 3U PRBCs. Hgb up to 8.9 this AM. CXR with pulm edema and R pleural effusion.  Seen in room. Legs are still hurting but a little better. Social situation still the same - remains uninterested in finding housing or a dialysis slot. She is agreeable to a dialysis treatment tomorrow.  Past Medical History:  Diagnosis Date   Acute on chronic blood loss anemia 08/01/2022   Acute on chronic diastolic CHF (congestive heart failure) (HCC) 08/12/2022   Acute on chronic respiratory failure with hypoxia (HCC) 05/16/2023   Acute pulmonary edema (HCC) 05/16/2023   Acute seasonal allergic rhinitis due to pollen 02/22/2016   Alcohol  abuse    Allergy    Anxiety    Arteriovenous fistula infection, initial encounter (HCC) 04/16/2023   Arthritis    Asthma    Cannabis abuse    Cocaine abuse (HCC)    COPD (chronic obstructive pulmonary disease) (HCC)    COPD with acute exacerbation (HCC) 10/25/2022   COVID 05/16/2023   Depression    Drug addiction (HCC)     GERD (gastroesophageal reflux disease)    GIB (gastrointestinal bleeding) 08/19/2019   Heart murmur    Heme positive stool 08/14/2022   Homelessness    Hx of adenomatous colonic polyps    Hyperlipidemia    Hypertension    pt stated every once in a while BP will be high but has not been prescribed medication for HTN.    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) 02/22/2016   SIRS (systemic inflammatory response syndrome) (HCC) 10/25/2022   Past Surgical History:  Procedure Laterality Date   A/V FISTULAGRAM Right 01/31/2023   Procedure: A/V Fistulagram;  Surgeon: Gretta Lonni PARAS, MD;  Location: [facility] CV [facility];  Service: Cardiovascular;  Laterality: Right;   AV FISTULA PLACEMENT Right 08/14/2022   Procedure: RIGHT ARM BRACHIOBASILIC ATERIOVENOUS FISTULA CREATION;  Surgeon: Magda Debby SAILOR, MD;  Location: [facility] OR;  Service: Vascular;  Laterality: Right;   BASCILIC VEIN TRANSPOSITION Right 11/13/2022   Procedure: RIGHT ARM SECOND STAGE BASILIC VEIN TRANSPOSITION;  Surgeon: Magda Debby SAILOR, MD;  Location: [facility] OR;  Service: Vascular;  Laterality: Right;   BIOPSY  01/01/2019   Procedure: BIOPSY;  Surgeon: Albertus Gordy HERO, MD;  Location: [facility] ENDOSCOPY;  Service: Endoscopy;;   BIOPSY  11/17/2021   Procedure: BIOPSY;  Surgeon: Charlanne Groom, MD;  Location: [facility];  Service: Gastroenterology;;   CESAREAN SECTION  1989   COLONOSCOPY  11/07/2020   2018   CYSTOSCOPY W/ URETERAL STENT PLACEMENT Left 08/01/2018   Procedure: CYSTOSCOPY WITH RETROGRADE PYELOGRAM/URETERAL STENT PLACEMENT;  Surgeon: Carolee Sherwood JONETTA DOUGLAS, MD;  Location: [facility] ORS;  Service: Urology;  Laterality: Left;   CYSTOSCOPY WITH RETROGRADE PYELOGRAM, URETEROSCOPY  AND STENT PLACEMENT Left 01/15/2019   Procedure: CYSTOSCOPY WITH RETROGRADE PYELOGRAM, URETEROSCOPY AND STENT PLACEMENT;  Surgeon: Carolee Sherwood JONETTA DOUGLAS, MD;  Location: [facility]S;  Service: Urology;  Laterality: Left;   ENTEROSCOPY N/A 08/16/2022   Procedure: ENTEROSCOPY;  Surgeon: Wilhelmenia Aloha Raddle., MD;  Location: [facility] ENDOSCOPY;  Service: Gastroenterology;  Laterality: N/A;   ENTEROSCOPY N/A 10/31/2022   Procedure: ENTEROSCOPY;  Surgeon: Albertus Gordy HERO, MD;  Location: [facility] ENDOSCOPY;  Service: Gastroenterology;  Laterality: N/A;   ENTEROSCOPY N/A 05/09/2023   Procedure: ENTEROSCOPY;  Surgeon: Federico Rosario BROCKS, MD;  Location: [facility] ENDOSCOPY;  Service: Gastroenterology;  Laterality: N/A;   ESOPHAGOGASTRODUODENOSCOPY (EGD) WITH PROPOFOL  N/A 01/01/2019   Procedure: ESOPHAGOGASTRODUODENOSCOPY (EGD) WITH PROPOFOL ;  Surgeon: Albertus Gordy HERO, MD;  Location: [facility]OS[facility];  Service: Endoscopy;  Laterality: N/A;   ESOPHAGOGASTRODUODENOSCOPY (EGD) WITH PROPOFOL  N/A 08/21/2019   Procedure: ESOPHAGOGASTRODUODENOSCOPY (EGD) WITH PROPOFOL ;  Surgeon: Shila Gustav GAILS, MD;  Location: [facility] ENDOSCOPY;  Service: Endoscopy;  Laterality: N/A;   ESOPHAGOGASTRODUODENOSCOPY (EGD) WITH PROPOFOL  N/A 11/17/2021   Procedure: ESOPHAGOGASTRODUODENOSCOPY (EGD) WITH PROPOFOL ;  Surgeon: Charlanne Groom, MD;  Location: [facility] ENDOSCOPY;  Service: Gastroenterology;  Laterality: N/A;   FRACTURE SURGERY Left 2011   arm   HEMOSTASIS CLIP PLACEMENT  08/16/2022   Procedure: HEMOSTASIS CLIP PLACEMENT;  Surgeon: Wilhelmenia Aloha Raddle., MD;  Location: [facility] ENDOSCOPY;  Service: Gastroenterology;;   HOT HEMOSTASIS N/A 08/16/2022   Procedure: HOT HEMOSTASIS (ARGON PLASMA COAGULATION/BICAP);  Surgeon: Wilhelmenia Aloha Raddle., MD;  Location: [facility] ENDOSCOPY;  Service: Gastroenterology;  Laterality: N/A;   HOT HEMOSTASIS N/A 10/31/2022   Procedure: HOT HEMOSTASIS (ARGON PLASMA COAGULATION/BICAP);  Surgeon: Albertus Gordy HERO, MD;  Location: [facility] ENDOSCOPY;  Service:  Gastroenterology;  Laterality: N/A;   HOT HEMOSTASIS N/A 05/09/2023   Procedure: HOT HEMOSTASIS (ARGON PLASMA COAGULATION/BICAP);  Surgeon: Federico Rosario BROCKS, MD;  Location: [facility] ENDOSCOPY;  Service: Gastroenterology;  Laterality: N/A;   INSERTION OF DIALYSIS CATHETER Right 08/14/2022   Procedure: INSERTION OF TUNNELED DIALYSIS CATHETER USING PALINDROME CATHETER KIT 19CM;  Surgeon: Magda Debby SAILOR, MD;  Location: [facility] OR;  Service: Vascular;  Laterality: Right;   POLYPECTOMY  08/16/2022   Procedure: POLYPECTOMY;  Surgeon: Wilhelmenia Aloha Raddle., MD;  Location: [facility] ENDOSCOPY;  Service: Gastroenterology;;   SUBMUCOSAL TATTOO INJECTION  08/16/2022   Procedure: SUBMUCOSAL TATTOO INJECTION;  Surgeon: Wilhelmenia Aloha Raddle., MD;  Location: [facility] ENDOSCOPY;  Service: Gastroenterology;;   UPPER GASTROINTESTINAL ENDOSCOPY     Family History  Problem Relation Age of Onset   Diabetes Father    Colon cancer Neg Hx    Esophageal cancer Neg Hx    Rectal cancer Neg Hx    Stomach cancer Neg Hx    Social History:  reports that she has been smoking cigarettes. She has been exposed to tobacco smoke. She has never used smokeless tobacco. She reports that she does not currently use alcohol . She reports current drug use. Drug: Marijuana.  ROS: As per HPI otherwise negative.  Physical Exam: Vitals:   09/07/23 0802 09/07/23 9192 09/07/23  0848 09/07/23 1051  BP: (!) 171/77 (!) 171/77  (!) 171/77  Pulse: 70 70    Resp: 16 16    Temp: (!) 97.5 F (36.4 C) (!) 97.5 F (36.4 C)    TempSrc: Oral Oral    SpO2:  (!) 81% 100%      General: Thin, chronically ill appearing woman, NAD. Room air Head: Normocephalic, adentulous. Neck: Supple without lymphadenopathy/masses. Lungs: Rhonchi throughout bilaterally Heart: RRR; no murmur Abdomen: Soft, non-tender, non-distended with normoactive bowel sounds.  Musculoskeletal:  Reduced muscle tone Lower extremities: No edema; scattered bruising on B legs Neuro: Alert and  oriented X 3. Moves all extremities spontaneously. Dialysis Access: RUE AVF +t/b  Allergies  Allergen Reactions   Zestril  [Lisinopril ] Other (See Comments)    Hyperkalemia   Prior to Admission medications   Medication Sig Start Date End Date Taking? Authorizing Provider  acetaminophen  (TYLENOL ) 325 MG tablet Take 2 tablets (650 mg total) by mouth every 6 (six) hours as needed for mild pain (pain score 1-3) (or Fever >/= 101). 07/01/23  Yes Danton Reyes DASEN, MD  albuterol  (VENTOLIN  HFA) 108 (90 Base) MCG/ACT inhaler Inhale 2 puffs into the lungs every 6 (six) hours as needed for wheezing or shortness of breath. 07/26/23  Yes Gherghe, Costin M, MD  amLODipine  (NORVASC ) 10 MG tablet Take 1 tablet (10 mg total) by mouth daily. 07/26/23  Yes Gherghe, Costin M, MD  gabapentin  (NEURONTIN ) 100 MG capsule Take 1 capsule (100 mg total) by mouth 3 (three) times daily. 07/26/23 10/24/23 Yes Gherghe, Nilda HERO, MD  atorvastatin  (LIPITOR) 10 MG tablet Take 1 tablet (10 mg total) by mouth daily. 07/26/23   Gherghe, Costin M, MD  calcitRIOL  (ROCALTROL ) 0.25 MCG capsule Take 1 capsule (0.25 mcg total) by mouth daily. 07/26/23   Gherghe, Costin M, MD  carvedilol  (COREG ) 6.25 MG tablet Take 1 tablet (6.25 mg total) by mouth 2 (two) times daily with a meal. 07/26/23   Trixie Nilda HERO, MD  ferrous sulfate  325 (65 FE) MG tablet Take 1 tablet (325 mg total) by mouth daily with breakfast. 07/26/23   Gherghe, Costin M, MD  nicotine  (NICODERM CQ  - DOSED IN MG/24 HOURS) 21 mg/24hr patch Place 1 patch (21 mg total) onto the skin daily. 07/27/23   Gherghe, Costin M, MD  oxyCODONE  (OXY IR/ROXICODONE ) 5 MG immediate release tablet Take 1 tablet (5 mg total) by mouth every 4 (four) hours as needed for severe pain (pain score 7-10). 07/26/23   Gherghe, Costin M, MD  pantoprazole  (PROTONIX ) 40 MG tablet Take 1 tablet (40 mg total) by mouth 2 (two) times daily. 07/26/23 08/25/23  Gherghe, Costin M, MD  sertraline  (ZOLOFT ) 25 MG tablet Take 1  tablet (25 mg total) by mouth daily. 07/26/23   Gherghe, Costin M, MD  sodium bicarbonate  650 MG tablet Take 2 tablets (1,300 mg total) by mouth 2 (two) times daily. 07/26/23 10/24/23  Trixie Nilda HERO, MD  sucralfate  (CARAFATE ) 1 GM/10ML suspension Take 10 mLs (1 g total) by mouth 4 (four) times daily for 21 days. 07/26/23 08/16/23  Gherghe, Costin M, MD  torsemide  (DEMADEX ) 100 MG tablet Take 1 tablet (100 mg total) by mouth daily. 07/26/23   Gherghe, Costin M, MD  traZODone  (DESYREL ) 50 MG tablet Take 1 tablet (50 mg total) by mouth at bedtime as needed for sleep. 07/26/23   Trixie Nilda HERO, MD  TRELEGY ELLIPTA  100-62.5-25 MCG/ACT AEPB Inhale 1 puff into the lungs daily. 07/26/23   Gherghe, Costin  M, MD   Current Facility-Administered Medications  Medication Dose Route Frequency Provider Last Rate Last Admin   0.9 %  sodium chloride  infusion (Manually program via Guardrails IV Fluids)   Intravenous Once Soto, Johana, PA-C       0.9 %  sodium chloride  infusion  250 mL Intravenous PRN Pratt, Tanya S, MD       acetaminophen  (TYLENOL ) tablet 650 mg  650 mg Oral Q6H PRN Pratt, Tanya S, MD       Or   acetaminophen  (TYLENOL ) suppository 650 mg  650 mg Rectal Q6H PRN Fredirick Glenys RAMAN, MD       acetaminophen  (TYLENOL ) tablet 650 mg  650 mg Oral Q6H PRN Fredirick Glenys RAMAN, MD       albuterol  (PROVENTIL ) (2.5 MG/3ML) 0.083% nebulizer solution 2.5 mg  2.5 mg Nebulization Q6H PRN Pratt, Tanya S, MD       amLODipine  (NORVASC ) tablet 10 mg  10 mg Oral Daily Pratt, Tanya S, MD   10 mg at 09/07/23 1051   atorvastatin  (LIPITOR) tablet 10 mg  10 mg Oral Daily Pratt, Tanya S, MD   10 mg at 09/07/23 1052   budesonide -glycopyrrolate -formoterol  (BREZTRI ) 160-9-4.8 MCG/ACT inhaler 2 puff  2 puff Inhalation BID Pratt, Tanya S, MD   2 puff at 09/07/23 0848   calcitRIOL  (ROCALTROL ) capsule 0.25 mcg  0.25 mcg Oral Daily Pratt, Tanya S, MD   0.25 mcg at 09/07/23 1052   carvedilol  (COREG ) tablet 6.25 mg  6.25 mg Oral BID WC Pratt,  Tanya S, MD   6.25 mg at 09/07/23 1052   ferrous sulfate  tablet 325 mg  325 mg Oral QODAY Pratt, Tanya S, MD   325 mg at 09/07/23 1052   folic acid  (FOLVITE ) tablet 1 mg  1 mg Oral Daily Pratt, Tanya S, MD   1 mg at 09/07/23 1052   gabapentin  (NEURONTIN ) capsule 100 mg  100 mg Oral TID Pratt, Tanya S, MD   100 mg at 09/07/23 1052   metoprolol  tartrate (LOPRESSOR ) injection 5 mg  5 mg Intravenous Q6H PRN Pratt, Tanya S, MD       multivitamin with minerals tablet 1 tablet  1 tablet Oral Daily Pratt, Tanya S, MD   1 tablet at 09/07/23 1051   nicotine  (NICODERM CQ  - dosed in mg/24 hours) patch 14 mg  14 mg Transdermal Daily Pratt, Tanya S, MD   14 mg at 09/07/23 1058   ondansetron  (ZOFRAN ) tablet 4 mg  4 mg Oral Q6H PRN Pratt, Tanya S, MD       Or   ondansetron  (ZOFRAN ) injection 4 mg  4 mg Intravenous Q6H PRN Pratt, Tanya S, MD       oxyCODONE  (Oxy IR/ROXICODONE ) immediate release tablet 5 mg  5 mg Oral Q4H PRN Pratt, Tanya S, MD       pantoprazole  (PROTONIX ) EC tablet 40 mg  40 mg Oral BID Pratt, Tanya S, MD   40 mg at 09/07/23 1051   polyethylene glycol (MIRALAX  / GLYCOLAX ) packet 17 g  17 g Oral Daily PRN Pratt, Tanya S, MD       sertraline  (ZOLOFT ) tablet 25 mg  25 mg Oral Daily Pratt, Tanya S, MD   25 mg at 09/07/23 1052   sodium bicarbonate  tablet 1,300 mg  1,300 mg Oral BID Pratt, Tanya S, MD   1,300 mg at 09/07/23 1052   sodium chloride  flush (NS) 0.9 % injection 3 mL  3 mL Intravenous Q12H Fredirick Glenys RAMAN,  MD       sodium chloride  flush (NS) 0.9 % injection 3 mL  3 mL Intravenous PRN Fredirick Glenys RAMAN, MD       sucralfate  (CARAFATE ) 1 GM/10ML suspension 1 g  1 g Oral QID Fredirick Glenys RAMAN, MD   1 g at 09/07/23 1052   thiamine  (VITAMIN B1) tablet 100 mg  100 mg Oral Daily Fredirick Glenys RAMAN, MD   100 mg at 09/07/23 1052   torsemide  (DEMADEX ) tablet 100 mg  100 mg Oral Daily Fredirick Glenys RAMAN, MD   100 mg at 09/07/23 1051   traZODone  (DESYREL ) tablet 50 mg  50 mg Oral QHS PRN Fredirick Glenys RAMAN, MD        Labs: Basic Metabolic Panel: Recent Labs  Lab 09/06/23 1543 09/07/23 0923  NA 135 134*  K 3.4* 3.6  CL 103 104  CO2 13* 13*  GLUCOSE 90 95  BUN 81* 78*  CREATININE 6.40* 6.15*  CALCIUM  7.0* 6.9*   Liver Function Tests: Recent Labs  Lab 09/06/23 1543 09/07/23 0923  AST 17 15  ALT 10 9  ALKPHOS 72 81  BILITOT 0.5 0.5  PROT 6.1* 6.1*  ALBUMIN  2.5* 2.6*   CBC: Recent Labs  Lab 09/06/23 1543 09/07/23 0923  WBC 6.4 7.2  NEUTROABS 4.6  --   HGB 4.3* 8.9*  HCT 13.7* 26.7*  MCV 108.7* 94.3  PLT 383 380   Studies/Results: DG Chest Portable 1 View Result Date: 09/06/2023 CLINICAL DATA:  Shortness of breath with chest EXAM: PORTABLE CHEST 1 VIEW COMPARISON:  Chest x-ray 07/23/2023 FINDINGS: The heart is enlarged, unchanged. There is a moderate right pleural effusion which has increased. Central interstitial prominence is seen bilaterally, increased from prior. No pneumothorax. No acute fracture. IMPRESSION: 1. Increasing moderate right pleural effusion. 2. Increased central interstitial prominence, likely edema. Electronically Signed   By: Greig Pique M.D.   On: 09/06/2023 19:36    Assessment/Plan:  Dyspnea: Pulm edema/effusions on CXR and with COPD/rhonchi as well. Will plan on HD tomorrow with UF as tolerated. Would also benefit from nebulizer/inhalers. Symptomatic anemia: Hgb 4.3 on admit. No signs of GIB, but she has Hx. S/p 3 units PRBCs so far. Will give dose of Aranesp  today. Follow stools for signs of bleeding and engage GI prn.  ESRD:  Does not have center, refuses regular HD. Does have some residual function. Last HD appears to be 4/10. For HD tomorrow and then reassess. Since has been > 2months, will start with short time and low BFR.   Hypertension/volume: BP high, follow with HD and current medications.  Metabolic bone disease: CorrCa ok, will check Phos and PTH.  Nutrition: Alb low, adding supps while she is here.  Severe leg pain/weakness: Possibly related  to the above, she is high risk for PAD.  COPD  Izetta Broadus RIGGERS 09/07/2023, 11:59 AM  [employer]

## 2023-09-07 NOTE — Progress Notes (Signed)
 PROGRESS NOTE    [name]  FMW:[id number] DOB: 1960/06/12 DOA: 09/06/2023 PCP: Vicci Barnie NOVAK, MD   Brief Narrative:  This 63 yrs. old female with PMH significant for tobacco use, polysubstance use, End-stage renal disease not on dialysis due to homelessness, history of chronic GI bleeding due to AVMs, GERD, PUD, COPD, HTN, Chronic leg pain who presents to the ED stating it is time for her to get dialysis.  She reports needing dialysis but unable to get this done due to homelessness.  She has chronic anemia with a baseline hemoglobin of 8-10.  In the ED she was noted to have a hemoglobin of 4.3.  Her creatinine was 6.4, her potassium was 3.4.  The patient had a chest x-ray that showed a right pleural effusion and findings consistent with CHF.  Patient does have a history of congestive heart failure and is on beta-blockers plus diuretics for this.  Patient was given three units PRBC . Nephrology and Gastroenterology was consulted. She was admitted for further evaluation.  Assessment & Plan:   Principal Problem:   Acute blood loss anemia Active Problems:   Chronic diastolic CHF (congestive heart failure) (HCC)   Acute on chronic anemia   ESRD on dialysis [facility])   Essential hypertension   COPD (chronic obstructive pulmonary disease) (HCC)   Anxiety and depression   GERD (gastroesophageal reflux disease)   Hyperlipidemia   Gastric AVM   Tobacco abuse   Chronic leg pain   Polysubstance abuse (HCC)   Angiodysplasia of upper gastrointestinal tract  Acute blood loss anemia: She is found to have hemoglobin 4.3. No acute visible bleeding noted. Transfused 3 units PRBC. Hb improved to 8.9. Follow-up H&H.   Acute on chronic anemia: Patient with end-stage renal disease and chronic anemia. Baseline hemoglobin is between 8 and 10. Patient has [place] history of upper GI bleeding due to GI AVMs and gastritis. Patient gives no reports of acute GI bleeding, denies hematemesis, melena  however she does take iron  daily and her stools are dark. Patient's hemoglobin is acutely low at 4.3. S/p 2 units PRBC. Hb improved to 8.9 Consider EPO   Chronic diastolic CHF (congestive heart failure) (HCC): Continue blood pressure control. Continue Demadex  daily Currently with right pleural effusion and findings of CHF.   She will get significant volume with blood transfusion and will need some increased diuresis. Continue Lasix  as needed.  ESRD on dialysis [facility]): Patient does have end-stage renal disease however due to her homelessness, She is unable to get chronic dialysis.  She continues to make urine. Nephrology has been consulted, patient states now time to get hemodialysis. The patient reports getting dialyzed occasionally when she feels like she needs it.   Her BUN is up to 81 but otherwise has no other acute need for dialysis at this time. Nephrology consulted.  Patient is scheduled for hemodialysis tomorrow.   Essential hypertension: Continue amlodipine , carvedilol .   COPD (chronic obstructive pulmonary disease) (HCC) Continue inhalers.   Anxiety and depression: Continue sertraline , trazodone .   Gastric AVM: Has had this in the past and needed APC. Asked EDP to consult GI to potentially see her in case this is needed again specifically if she has acute GI bleeding.   Hyperlipidemia: Low-cholesterol diet.   GERD (gastroesophageal reflux disease) Continue PPI, sucralfate .   Tobacco abuse: Patient reports cutting down on smoking.   She was offered and accepted nicotine  patch.   Polysubstance abuse [facility]) Patient continues to use cocaine sporadically.  She reports last use was approximately 4 to 5 days ago.   She denies any IV drug use.    DVT prophylaxis:SCDs Code Status: DNR Family Communication: No family at bed side. Disposition Plan:    Status is: Inpatient Remains inpatient appropriate because: Admitted for significant anemia and AKI requiring  hemodialysis.  Nephrology and gastroenterology is consulted     Consultants:  Nephrology Gastroenterology  Procedures: None  Antimicrobials:  Anti-infectives (From admission, onward)    None      Subjective: Patient was seen and examined at bedside.  Overnight events noted. Patient reports feeling improved. She states that she wants to have endoscopy and also wants to have hemodialysis.  Objective: Vitals:   09/07/23 0802 09/07/23 0807 09/07/23 0848 09/07/23 1051  BP: (!) 171/77 (!) 171/77  (!) 171/77  Pulse: 70 70    Resp: 16 16    Temp: (!) 97.5 F (36.4 C) (!) 97.5 F (36.4 C)    TempSrc: Oral Oral    SpO2:  (!) 81% 100%     Intake/Output Summary (Last 24 hours) at 09/07/2023 1415 Last data filed at 09/07/2023 0802 Gross per 24 hour  Intake 1078 ml  Output --  Net 1078 ml   There were no vitals filed for this visit.  Examination:  General exam: Appears calm and comfortable, deconditioned, not in any acute distress. Respiratory system: Clear to auscultation. Respiratory effort normal.  RR 16 Cardiovascular system: S1 & S2 heard, RRR. No JVD, murmurs, rubs, gallops or clicks. No pedal edema. Gastrointestinal system: Abdomen is non distended, soft and non tender.  Normal bowel sounds heard. Central nervous system: Alert and oriented x 3. No focal neurological deficits. Extremities: No edema, no cyanosis, no clubbing. Skin: No rashes, lesions or ulcers Psychiatry: Judgement and insight appear normal. Mood & affect appropriate.     Data Reviewed: I have personally reviewed following labs and imaging studies  CBC: Recent Labs  Lab 09/06/23 1543 09/07/23 0923  WBC 6.4 7.2  NEUTROABS 4.6  --   HGB 4.3* 8.9*  HCT 13.7* 26.7*  MCV 108.7* 94.3  PLT 383 380   Basic Metabolic Panel: Recent Labs  Lab 09/06/23 1543 09/07/23 0923  NA 135 134*  K 3.4* 3.6  CL 103 104  CO2 13* 13*  GLUCOSE 90 95  BUN 81* 78*  CREATININE 6.40* 6.15*  CALCIUM  7.0* 6.9*    GFR: CrCl cannot be calculated (Unknown ideal weight.). Liver Function Tests: Recent Labs  Lab 09/06/23 1543 09/07/23 0923  AST 17 15  ALT 10 9  ALKPHOS 72 81  BILITOT 0.5 0.5  PROT 6.1* 6.1*  ALBUMIN  2.5* 2.6*   No results for input(s): LIPASE, AMYLASE in the last 168 hours. No results for input(s): AMMONIA in the last 168 hours. Coagulation Profile: No results for input(s): INR, PROTIME in the last 168 hours. Cardiac Enzymes: No results for input(s): CKTOTAL, CKMB, CKMBINDEX, TROPONINI in the last 168 hours. BNP (last 3 results) No results for input(s): PROBNP in the last 8760 hours. HbA1C: No results for input(s): HGBA1C in the last 72 hours. CBG: No results for input(s): GLUCAP in the last 168 hours. Lipid Profile: No results for input(s): CHOL, HDL, LDLCALC, TRIG, CHOLHDL, LDLDIRECT in the last 72 hours. Thyroid Function Tests: Recent Labs    09/06/23 2322  TSH 1.733   Anemia Panel: No results for input(s): VITAMINB12, FOLATE, FERRITIN, TIBC, IRON , RETICCTPCT in the last 72 hours. Sepsis Labs: No results for input(s): PROCALCITON, LATICACIDVEN  in the last 168 hours.  No results found for this or any previous visit (from the past 240 hours).   Radiology Studies: DG Chest Portable 1 View Result Date: 09/06/2023 CLINICAL DATA:  Shortness of breath with chest EXAM: PORTABLE CHEST 1 VIEW COMPARISON:  Chest x-ray 07/23/2023 FINDINGS: The heart is enlarged, unchanged. There is a moderate right pleural effusion which has increased. Central interstitial prominence is seen bilaterally, increased from prior. No pneumothorax. No acute fracture. IMPRESSION: 1. Increasing moderate right pleural effusion. 2. Increased central interstitial prominence, likely edema. Electronically Signed   By: Greig Pique M.D.   On: 09/06/2023 19:36   Scheduled Meds:  sodium chloride    Intravenous Once   amLODipine   10 mg Oral Daily    atorvastatin   10 mg Oral Daily   budesonide -glycopyrrolate -formoterol   2 puff Inhalation BID   calcitRIOL   0.25 mcg Oral Daily   carvedilol   6.25 mg Oral BID WC   [START ON 09/08/2023] Chlorhexidine  Gluconate Cloth  6 each Topical Q0600   darbepoetin (ARANESP ) injection - DIALYSIS  100 mcg Subcutaneous Once   ferrous sulfate   325 mg Oral QODAY   folic acid   1 mg Oral Daily   gabapentin   100 mg Oral TID   multivitamin with minerals  1 tablet Oral Daily   nicotine   14 mg Transdermal Daily   pantoprazole   40 mg Oral BID   sertraline   25 mg Oral Daily   sodium bicarbonate   1,300 mg Oral BID   sodium chloride  flush  3 mL Intravenous Q12H   sucralfate   1 g Oral QID   thiamine   100 mg Oral Daily   torsemide   100 mg Oral Daily   Continuous Infusions:  sodium chloride        LOS: 1 day    Time spent: 50 mins    Darcel Dawley, MD [facility]   If 7PM-7AM, please contact night-coverage

## 2023-09-07 NOTE — Plan of Care (Signed)
  Problem: Activity: Goal: Risk for activity intolerance will decrease Outcome: Progressing   Problem: Nutrition: Goal: Adequate nutrition will be maintained Outcome: Progressing   Problem: Elimination: Goal: Will not experience complications related to urinary retention Outcome: Progressing   Problem: Pain Managment: Goal: General experience of comfort will improve and/or be controlled Outcome: Progressing   Problem: Safety: Goal: Ability to remain free from injury will improve Outcome: Progressing
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
## 2023-09-08 LAB — RESP PANEL BY RT-PCR (RSV, FLU A&B, COVID)  RVPGX2
Influenza A by PCR: NEGATIVE
Influenza B by PCR: NEGATIVE
Resp Syncytial Virus by PCR: NEGATIVE
SARS Coronavirus 2 by RT PCR: NEGATIVE

## 2023-09-08 LAB — CBC
HCT: 22.7 % — ABNORMAL LOW (ref 36.0–46.0)
Hemoglobin: 7.5 g/dL — ABNORMAL LOW (ref 12.0–15.0)
MCH: 31.3 pg (ref 26.0–34.0)
MCHC: 33 g/dL (ref 30.0–36.0)
MCV: 94.6 fL (ref 80.0–100.0)
Platelets: 337 10*3/uL (ref 150–400)
RBC: 2.4 MIL/uL — ABNORMAL LOW (ref 3.87–5.11)
RDW: 20 % — ABNORMAL HIGH (ref 11.5–15.5)
WBC: 5.7 10*3/uL (ref 4.0–10.5)
nRBC: 0.5 % — ABNORMAL HIGH (ref 0.0–0.2)

## 2023-09-08 LAB — BASIC METABOLIC PANEL WITH GFR
Anion gap: 17 — ABNORMAL HIGH (ref 5–15)
BUN: 78 mg/dL — ABNORMAL HIGH (ref 8–23)
CO2: 11 mmol/L — ABNORMAL LOW (ref 22–32)
Calcium: 6.9 mg/dL — ABNORMAL LOW (ref 8.9–10.3)
Chloride: 105 mmol/L (ref 98–111)
Creatinine, Ser: 6.18 mg/dL — ABNORMAL HIGH (ref 0.44–1.00)
GFR, Estimated: 7 mL/min — ABNORMAL LOW (ref >60)
Glucose, Bld: 101 mg/dL — ABNORMAL HIGH (ref 70–99)
Potassium: 3.1 mmol/L — ABNORMAL LOW (ref 3.5–5.1)
Sodium: 133 mmol/L — ABNORMAL LOW (ref 135–145)

## 2023-09-08 LAB — HEPATITIS PANEL, ACUTE
HCV Ab: REACTIVE — AB
Hep A IgM: NONREACTIVE
Hep B C IgM: NONREACTIVE
Hepatitis B Surface Ag: NONREACTIVE

## 2023-09-08 LAB — MAGNESIUM: Magnesium: 1.7 mg/dL (ref 1.7–2.4)

## 2023-09-08 LAB — HCV RNA QUANT
HCV Quantitative Log: 5.52 {Log_IU}/mL (ref >1.70)
HCV Quantitative: 331000 [IU]/mL (ref >50)

## 2023-09-08 LAB — PHOSPHORUS: Phosphorus: 6.6 mg/dL — ABNORMAL HIGH (ref 2.5–4.6)

## 2023-09-08 MED ORDER — SODIUM CHLORIDE 0.9 % IV SOLN: INTRAVENOUS | Status: DC
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
## 2023-09-08 NOTE — Progress Notes (Signed)
   09/08/23 1335  SDOH Interventions  Food Insecurity Interventions Inpatient TOC   REsources on AVS

## 2023-09-08 NOTE — H&P (View-Only) (Signed)
 Daily Progress Note  DOA: 09/06/2023 Hospital Day: 3  Cc: Acute on chronic anemia   ASSESSMENT    63 year old female admitted with acute on chronic anemia and history of gastric / small bowel AVMs / erosive gastritis in Feb 2025.  Also duodenitis on non-contrast CT scan in May 2026 Presenting hemoglobin 4.3 (down from 8.3 in May 2025).  Stools are always dark on oral iron  >>Today: Hemoglobin improved to 7.5 post 3 units RBCs  ESRD on HD  COPD / HTN / CHF / substance abuse ( cocaine).  See PMH for additional medical history  Principal Problem:   Acute blood loss anemia Active Problems:   Essential hypertension   Chronic leg pain   Tobacco abuse   Anxiety and depression   GERD (gastroesophageal reflux disease)   Polysubstance abuse (HCC)   Chronic diastolic CHF (congestive heart failure) (HCC)   Hyperlipidemia   Acute on chronic anemia   COPD (chronic obstructive pulmonary disease) (HCC)   ESRD on dialysis (HCC)   Angiodysplasia of upper gastrointestinal tract   Gastric AVM   PLAN   --Small bowel enteroscopy tomorrow.  The risk and benefits of the procedure were reviewed with patient yesterday.  -- Continue twice daily PPI -- Okay to continue 4 times daily Carafate  but need to limit to short course given renal disease.   Subjective   No complaints.  No overt GI bleeding but stools always dark on oral iron     Objective    Recent Labs    09/06/23 1543 09/07/23 0923 09/08/23 0644  WBC 6.4 7.2 5.7  HGB 4.3* 8.9* 7.5*  HCT 13.7* 26.7* 22.7*  MCV 108.7* 94.3 94.6  PLT 383 380 337   Recent Labs    09/06/23 1543 09/07/23 0923 09/08/23 0644  NA 135 134* 133*  K 3.4* 3.6 3.1*  CL 103 104 105  CO2 13* 13* 11*  GLUCOSE 90 95 101*  BUN 81* 78* 78*  CREATININE 6.40* 6.15* 6.18*  CALCIUM  7.0* 6.9* 6.9*   Recent Labs    09/06/23 1543 09/07/23 0923  PROT 6.1* 6.1*  ALBUMIN  2.5* 2.6*  AST 17 15  ALT 10 9  ALKPHOS 72 81  BILITOT 0.5 0.5       Imaging:  DG Chest Portable 1 View CLINICAL DATA:  Shortness of breath with chest  EXAM: PORTABLE CHEST 1 VIEW  COMPARISON:  Chest x-ray 07/23/2023  FINDINGS: The heart is enlarged, unchanged. There is a moderate right pleural effusion which has increased. Central interstitial prominence is seen bilaterally, increased from prior. No pneumothorax. No acute fracture.  IMPRESSION: 1. Increasing moderate right pleural effusion. 2. Increased central interstitial prominence, likely edema.  Electronically Signed   By: Greig Pique M.D.   On: 09/06/2023 19:36     Scheduled inpatient medications:   sodium chloride    Intravenous Once   amLODipine   10 mg Oral Daily   atorvastatin   10 mg Oral Daily   budesonide -glycopyrrolate -formoterol   2 puff Inhalation BID   calcitRIOL   0.25 mcg Oral Daily   carvedilol   6.25 mg Oral BID WC   Chlorhexidine  Gluconate Cloth  6 each Topical Q0600   ferrous sulfate   325 mg Oral QODAY   folic acid   1 mg Oral Daily   gabapentin   100 mg Oral TID   multivitamin with minerals  1 tablet Oral Daily   nicotine   14 mg Transdermal Daily   pantoprazole   40 mg Oral BID   sertraline   25  mg Oral Daily   sodium bicarbonate   1,300 mg Oral BID   sodium chloride  flush  3 mL Intravenous Q12H   sucralfate   1 g Oral QID   thiamine   100 mg Oral Daily   torsemide   100 mg Oral Daily   Continuous inpatient infusions:  PRN inpatient medications: acetaminophen  **OR** acetaminophen , acetaminophen , albuterol , metoprolol  tartrate, ondansetron  **OR** ondansetron  (ZOFRAN ) IV, oxyCODONE , polyethylene glycol, sodium chloride  flush, traZODone   Vital signs in last 24 hours: Temp:  [97.5 F (36.4 C)-97.7 F (36.5 C)] 97.5 F (36.4 C) (06/23 0518) Pulse Rate:  [63-86] 67 (06/23 1528) Resp:  [16-18] 16 (06/23 1528) BP: (123-134)/(52-72) 123/52 (06/23 1528) SpO2:  [95 %-100 %] 100 % (06/23 1528) Last BM Date : 09/06/23  Intake/Output Summary (Last 24 hours) at  09/08/2023 1648 Last data filed at 09/08/2023 1400 Gross per 24 hour  Intake 720 ml  Output --  Net 720 ml    Intake/Output from previous day: 06/22 0701 - 06/23 0700 In: 822 [P.O.:460; Blood:362] Out: -  Intake/Output this shift: Total I/O In: 720 [P.O.:720] Out: -    Physical Exam:  General: Alert thin female in NAD Heart:  Regular rate and rhythm.  Pulmonary: Normal respiratory effort Abdomen: Soft, protuberant , nontender. Normal bowel sounds. Extremities: No lower extremity edema  Neurologic: Alert and oriented Psych: Pleasant. Cooperative     LOS: 2 days   Vina Dasen ,NP 09/08/2023, 4:48 PM

## 2023-09-08 NOTE — Progress Notes (Signed)
 Daily Progress Note  DOA: 09/06/2023 Hospital Day: 3  Cc: Acute on chronic anemia   ASSESSMENT    63 year old female admitted with acute on chronic anemia and history of gastric / small bowel AVMs / erosive gastritis in Feb 2025.  Also duodenitis on non-contrast CT scan in May 2026 Presenting hemoglobin 4.3 (down from 8.3 in May 2025).  Stools are always dark on oral iron  >>Today: Hemoglobin improved to 7.5 post 3 units RBCs  ESRD on HD  COPD / HTN / CHF / substance abuse ( cocaine).  See PMH for additional medical history  Principal Problem:   Acute blood loss anemia Active Problems:   Essential hypertension   Chronic leg pain   Tobacco abuse   Anxiety and depression   GERD (gastroesophageal reflux disease)   Polysubstance abuse (HCC)   Chronic diastolic CHF (congestive heart failure) (HCC)   Hyperlipidemia   Acute on chronic anemia   COPD (chronic obstructive pulmonary disease) (HCC)   ESRD on dialysis (HCC)   Angiodysplasia of upper gastrointestinal tract   Gastric AVM   PLAN   --Small bowel enteroscopy tomorrow.  The risk and benefits of the procedure were reviewed with patient yesterday.  -- Continue twice daily PPI -- Okay to continue 4 times daily Carafate  but need to limit to short course given renal disease.   Subjective   No complaints.  No overt GI bleeding but stools always dark on oral iron     Objective    Recent Labs    09/06/23 1543 09/07/23 0923 09/08/23 0644  WBC 6.4 7.2 5.7  HGB 4.3* 8.9* 7.5*  HCT 13.7* 26.7* 22.7*  MCV 108.7* 94.3 94.6  PLT 383 380 337   Recent Labs    09/06/23 1543 09/07/23 0923 09/08/23 0644  NA 135 134* 133*  K 3.4* 3.6 3.1*  CL 103 104 105  CO2 13* 13* 11*  GLUCOSE 90 95 101*  BUN 81* 78* 78*  CREATININE 6.40* 6.15* 6.18*  CALCIUM  7.0* 6.9* 6.9*   Recent Labs    09/06/23 1543 09/07/23 0923  PROT 6.1* 6.1*  ALBUMIN  2.5* 2.6*  AST 17 15  ALT 10 9  ALKPHOS 72 81  BILITOT 0.5 0.5       Imaging:  DG Chest Portable 1 View CLINICAL DATA:  Shortness of breath with chest  EXAM: PORTABLE CHEST 1 VIEW  COMPARISON:  Chest x-ray 07/23/2023  FINDINGS: The heart is enlarged, unchanged. There is a moderate right pleural effusion which has increased. Central interstitial prominence is seen bilaterally, increased from prior. No pneumothorax. No acute fracture.  IMPRESSION: 1. Increasing moderate right pleural effusion. 2. Increased central interstitial prominence, likely edema.  Electronically Signed   By: Greig Pique M.D.   On: 09/06/2023 19:36     Scheduled inpatient medications:   sodium chloride    Intravenous Once   amLODipine   10 mg Oral Daily   atorvastatin   10 mg Oral Daily   budesonide -glycopyrrolate -formoterol   2 puff Inhalation BID   calcitRIOL   0.25 mcg Oral Daily   carvedilol   6.25 mg Oral BID WC   Chlorhexidine  Gluconate Cloth  6 each Topical Q0600   ferrous sulfate   325 mg Oral QODAY   folic acid   1 mg Oral Daily   gabapentin   100 mg Oral TID   multivitamin with minerals  1 tablet Oral Daily   nicotine   14 mg Transdermal Daily   pantoprazole   40 mg Oral BID   sertraline   25  mg Oral Daily   sodium bicarbonate   1,300 mg Oral BID   sodium chloride  flush  3 mL Intravenous Q12H   sucralfate   1 g Oral QID   thiamine   100 mg Oral Daily   torsemide   100 mg Oral Daily   Continuous inpatient infusions:  PRN inpatient medications: acetaminophen  **OR** acetaminophen , acetaminophen , albuterol , metoprolol  tartrate, ondansetron  **OR** ondansetron  (ZOFRAN ) IV, oxyCODONE , polyethylene glycol, sodium chloride  flush, traZODone   Vital signs in last 24 hours: Temp:  [97.5 F (36.4 C)-97.7 F (36.5 C)] 97.5 F (36.4 C) (06/23 0518) Pulse Rate:  [63-86] 67 (06/23 1528) Resp:  [16-18] 16 (06/23 1528) BP: (123-134)/(52-72) 123/52 (06/23 1528) SpO2:  [95 %-100 %] 100 % (06/23 1528) Last BM Date : 09/06/23  Intake/Output Summary (Last 24 hours) at  09/08/2023 1648 Last data filed at 09/08/2023 1400 Gross per 24 hour  Intake 720 ml  Output --  Net 720 ml    Intake/Output from previous day: 06/22 0701 - 06/23 0700 In: 822 [P.O.:460; Blood:362] Out: -  Intake/Output this shift: Total I/O In: 720 [P.O.:720] Out: -    Physical Exam:  General: Alert thin female in NAD Heart:  Regular rate and rhythm.  Pulmonary: Normal respiratory effort Abdomen: Soft, protuberant , nontender. Normal bowel sounds. Extremities: No lower extremity edema  Neurologic: Alert and oriented Psych: Pleasant. Cooperative     LOS: 2 days   Vina Dasen ,NP 09/08/2023, 4:48 PM

## 2023-09-08 NOTE — Progress Notes (Signed)
 PROGRESS NOTE    [name]  FMW:[id number] DOB: Oct 15, 1960 DOA: 09/06/2023 PCP: Vicci Barnie NOVAK, MD   Brief Narrative:  This 63 yrs. old female with PMH significant for tobacco use, polysubstance use, End-stage renal disease not on dialysis due to homelessness, history of chronic GI bleeding due to AVMs, GERD, PUD, COPD, HTN, Chronic leg pain who presents to the ED stating it is time for her to get dialysis.  She reports needing dialysis but unable to get this done due to homelessness.  She has chronic anemia with a baseline hemoglobin of 8-10.  In the ED she was noted to have a hemoglobin of 4.3.  Her creatinine was 6.4, her potassium was 3.4.  The patient had a chest x-ray that showed a right pleural effusion and findings consistent with CHF.  Patient does have a history of congestive heart failure and is on beta-blockers plus diuretics for this.  Patient was given three units PRBC . Nephrology and Gastroenterology was consulted. She was admitted for further evaluation.  Assessment & Plan:   Principal Problem:   Acute blood loss anemia Active Problems:   Chronic diastolic CHF (congestive heart failure) (HCC)   Acute on chronic anemia   ESRD on dialysis [facility])   Essential hypertension   COPD (chronic obstructive pulmonary disease) (HCC)   Anxiety and depression   GERD (gastroesophageal reflux disease)   Hyperlipidemia   Gastric AVM   Tobacco abuse   Chronic leg pain   Polysubstance abuse (HCC)   Angiodysplasia of upper gastrointestinal tract  Acute blood loss anemia: She is found to have hemoglobin 4.3. No acute visible bleeding noted. Transfused 3 units PRBC. Hb improved to 8.9. Follow-up H&H.   Acute on chronic anemia: Patient with end-stage renal disease and chronic anemia. Baseline hemoglobin is between 8 and 10. Patient has [place] history of upper GI bleeding due to GI AVMs and gastritis. Patient gives no reports of acute GI bleeding, denies hematemesis, melena  however she does take iron  daily and her stools are dark. Patient's hemoglobin is acutely low at 4.3. S/p 2 units PRBC. Hb improved to 8.9 Consider EPO   Chronic diastolic CHF (congestive heart failure) (HCC): Continue blood pressure control. Continue Demadex  daily. Currently with right pleural effusion and findings of CHF.   She will get significant volume with blood transfusion and will need some increased diuresis. Continue Lasix  as needed.  ESRD on dialysis [facility]): Patient does have end-stage renal disease however due to her homelessness, She is unable to get chronic dialysis.  She continues to make urine. Nephrology has been consulted, patient states now its  time to get hemodialysis. The patient reports getting dialyzed occasionally when she feels like she needs it.   Her BUN is up to 81 but otherwise has no other acute need for dialysis at this time. Nephrology consulted.  Patient is scheduled for hemodialysis today.   Essential hypertension: Continue amlodipine , carvedilol .   COPD (chronic obstructive pulmonary disease) (HCC) Continue inhalers.   Anxiety and depression: Continue sertraline , trazodone .   Gastric AVM: Has had this in the past and needed APC. Asked EDP to consult GI to potentially see her in case this is needed again specifically if she has acute GI bleeding.   Hyperlipidemia: Low-cholesterol diet.   GERD (gastroesophageal reflux disease) Continue PPI, sucralfate .   Tobacco abuse: Patient reports cutting down on smoking.   She was offered and accepted nicotine  patch.   Polysubstance abuse [facility]) Patient continues to use cocaine sporadically.  She reports last use was approximately 4 to 5 days ago.   She denies any IV drug use.    DVT prophylaxis:SCDs Code Status: DNR Family Communication: No family at bed side. Disposition Plan:    Status is: Inpatient Remains inpatient appropriate because: Admitted for significant anemia and AKI requiring  hemodialysis.  Nephrology and gastroenterology is consulted     Consultants:  Nephrology Gastroenterology  Procedures: None  Antimicrobials:  Anti-infectives (From admission, onward)    None      Subjective: Patient was seen and examined at bedside. Overnight events noted. Patient reports feeling improved. She states that she wants to have endoscopy and also wants to have hemodialysis. Patient is scheduled for hemodialysis today.  Objective: Vitals:   09/07/23 2156 09/08/23 0518 09/08/23 0828 09/08/23 0953  BP: 130/72 128/64  134/61  Pulse: 86 63  66  Resp: 18 17  17   Temp: 97.7 F (36.5 C) (!) 97.5 F (36.4 C)    TempSrc: Oral Oral    SpO2: 95% 97% 96% 97%    Intake/Output Summary (Last 24 hours) at 09/08/2023 1233 Last data filed at 09/08/2023 9047 Gross per 24 hour  Intake 580 ml  Output --  Net 580 ml   There were no vitals filed for this visit.  Examination:  General exam: Appears calm and comfortable, deconditioned, not in any acute distress. Respiratory system: CTA Blaterally. Respiratory effort normal.  RR 16 Cardiovascular system: S1 & S2 heard, RRR. No JVD, murmurs, rubs, gallops or clicks. No pedal edema. Gastrointestinal system: Abdomen is non distended, soft and non tender.  Normal bowel sounds heard. Central nervous system: Alert and oriented x 3. No focal neurological deficits. Extremities: No edema, no cyanosis, no clubbing. Skin: No rashes, lesions or ulcers Psychiatry: Judgement and insight appear normal. Mood & affect appropriate.     Data Reviewed: I have personally reviewed following labs and imaging studies  CBC: Recent Labs  Lab 09/06/23 1543 09/07/23 0923 09/08/23 0644  WBC 6.4 7.2 5.7  NEUTROABS 4.6  --   --   HGB 4.3* 8.9* 7.5*  HCT 13.7* 26.7* 22.7*  MCV 108.7* 94.3 94.6  PLT 383 380 337   Basic Metabolic Panel: Recent Labs  Lab 09/06/23 1543 09/07/23 0923 09/08/23 0644  NA 135 134* 133*  K 3.4* 3.6 3.1*  CL 103  104 105  CO2 13* 13* 11*  GLUCOSE 90 95 101*  BUN 81* 78* 78*  CREATININE 6.40* 6.15* 6.18*  CALCIUM  7.0* 6.9* 6.9*  MG  --   --  1.7  PHOS  --   --  6.6*   GFR: CrCl cannot be calculated (Unknown ideal weight.). Liver Function Tests: Recent Labs  Lab 09/06/23 1543 09/07/23 0923  AST 17 15  ALT 10 9  ALKPHOS 72 81  BILITOT 0.5 0.5  PROT 6.1* 6.1*  ALBUMIN  2.5* 2.6*   No results for input(s): LIPASE, AMYLASE in the last 168 hours. No results for input(s): AMMONIA in the last 168 hours. Coagulation Profile: No results for input(s): INR, PROTIME in the last 168 hours. Cardiac Enzymes: No results for input(s): CKTOTAL, CKMB, CKMBINDEX, TROPONINI in the last 168 hours. BNP (last 3 results) No results for input(s): PROBNP in the last 8760 hours. HbA1C: No results for input(s): HGBA1C in the last 72 hours. CBG: No results for input(s): GLUCAP in the last 168 hours. Lipid Profile: No results for input(s): CHOL, HDL, LDLCALC, TRIG, CHOLHDL, LDLDIRECT in the last 72 hours. Thyroid Function Tests:  Recent Labs    09/06/23 2322  TSH 1.733   Anemia Panel: No results for input(s): VITAMINB12, FOLATE, FERRITIN, TIBC, IRON , RETICCTPCT in the last 72 hours. Sepsis Labs: No results for input(s): PROCALCITON, LATICACIDVEN in the last 168 hours.  Recent Results (from the past 240 hours)  Resp panel by RT-PCR (RSV, Flu A&B, Covid) Anterior Nasal Swab     Status: None   Collection Time: 09/06/23  8:50 PM   Specimen: Anterior Nasal Swab  Result Value Ref Range Status   SARS Coronavirus 2 by RT PCR NEGATIVE NEGATIVE Final   Influenza A by PCR NEGATIVE NEGATIVE Final   Influenza B by PCR NEGATIVE NEGATIVE Final    Comment: (NOTE) The Xpert Xpress SARS-CoV-2/FLU/RSV plus assay is intended as an aid in the diagnosis of influenza from Nasopharyngeal swab specimens and should not be used as a sole basis for treatment. Nasal washings  and aspirates are unacceptable for Xpert Xpress SARS-CoV-2/FLU/RSV testing.  Fact Sheet for Patients: BloggerCourse.com  Fact Sheet for Healthcare Providers: SeriousBroker.it  This test is not yet approved or cleared by the [place]  FDA and has been authorized for detection and/or diagnosis of SARS-CoV-2 by FDA under an Emergency Use Authorization (EUA). This EUA will remain in effect (meaning this test can be used) for the duration of the COVID-19 declaration under Section 564(b)(1) of the Act, 21 U.S.C. section 360bbb-3(b)(1), unless the authorization is terminated or revoked.     Resp Syncytial Virus by PCR NEGATIVE NEGATIVE Final    Comment: (NOTE) Fact Sheet for Patients: BloggerCourse.com  Fact Sheet for Healthcare Providers: SeriousBroker.it  This test is not yet approved or cleared by the [place]  FDA and has been authorized for detection and/or diagnosis of SARS-CoV-2 by FDA under an Emergency Use Authorization (EUA). This EUA will remain in effect (meaning this test can be used) for the duration of the COVID-19 declaration under Section 564(b)(1) of the Act, 21 U.S.C. section 360bbb-3(b)(1), unless the authorization is terminated or revoked.  Performed at [facility], [address] [place], [place] [address]      Radiology Studies: DG Chest Portable 1 View Result Date: 09/06/2023 CLINICAL DATA:  Shortness of breath with chest EXAM: PORTABLE CHEST 1 VIEW COMPARISON:  Chest x-ray 07/23/2023 FINDINGS: The heart is enlarged, unchanged. There is a moderate right pleural effusion which has increased. Central interstitial prominence is seen bilaterally, increased from prior. No pneumothorax. No acute fracture. IMPRESSION: 1. Increasing moderate right pleural effusion. 2. Increased central interstitial prominence, likely edema. Electronically Signed   By:  Greig Pique M.D.   On: 09/06/2023 19:36   Scheduled Meds:  sodium chloride    Intravenous Once   amLODipine   10 mg Oral Daily   atorvastatin   10 mg Oral Daily   budesonide -glycopyrrolate -formoterol   2 puff Inhalation BID   calcitRIOL   0.25 mcg Oral Daily   carvedilol   6.25 mg Oral BID WC   Chlorhexidine  Gluconate Cloth  6 each Topical Q0600   ferrous sulfate   325 mg Oral QODAY   folic acid   1 mg Oral Daily   gabapentin   100 mg Oral TID   multivitamin with minerals  1 tablet Oral Daily   nicotine   14 mg Transdermal Daily   pantoprazole   40 mg Oral BID   sertraline   25 mg Oral Daily   sodium bicarbonate   1,300 mg Oral BID   sodium chloride  flush  3 mL Intravenous Q12H   sucralfate   1 g Oral QID  thiamine   100 mg Oral Daily   torsemide   100 mg Oral Daily   Continuous Infusions:     LOS: 2 days    Time spent: 35 mins    Darcel Dawley, MD [facility]   If 7PM-7AM, please contact night-coverage

## 2023-09-08 NOTE — Anesthesia Preprocedure Evaluation (Signed)
 Anesthesia Evaluation  Patient identified by MRN, date of birth, ID band Patient awake    Reviewed: Allergy & Precautions, H&P , NPO status , Patient's Chart, lab work & pertinent test results  Airway Mallampati: II   Neck ROM: full    Dental  (+) Edentulous Upper, Edentulous Lower   Pulmonary asthma , COPD, Current Smoker and Patient abstained from smoking.   + rhonchi  (-) wheezing      Cardiovascular hypertension, Pt. on medications +CHF  + Valvular Problems/Murmurs  Rhythm:regular Rate:Normal  Echo 01/2023  1. LIMITED ECHOCARDIOGRAM.   2. Left ventricular ejection fraction, by estimation, is 50 to 55%. The  left ventricle has low normal function. The left ventricle has no regional  wall motion abnormalities. Diastolic dysfunction not assessed as tissue  Doppler was not measured.   3. Right ventricular systolic function grossly normal. The right  ventricular size is grossly normal.   4. The mitral valve is grossly normal. Trivial mitral valve  regurgitation. No evidence of mitral stenosis.   5. The aortic valve was not well visualized. Aortic valve regurgitation  is not visualized.   6. The inferior vena cava is normal in size with greater than 50%  respiratory variability, suggesting right atrial pressure of 3 mmHg.      Neuro/Psych  PSYCHIATRIC DISORDERS Anxiety Depression       GI/Hepatic PUD,GERD  ,,(+)     substance abuse  cocaine use and marijuana use, Hepatitis -, CGi bleeding   Endo/Other  diabetes    Renal/GU Renal InsufficiencyRenal disease     Musculoskeletal  (+) Arthritis ,    Abdominal   Peds  Hematology  (+) Blood dyscrasia, anemia Hemoglobin 7.6   Anesthesia Other Findings   Reproductive/Obstetrics                             Anesthesia Physical Anesthesia Plan  ASA: 4  Anesthesia Plan: MAC   Post-op Pain Management:    Induction: Intravenous  PONV  Risk Score and Plan: 1 and Propofol  infusion, Treatment may vary due to age or medical condition and TIVA  Airway Management Planned: Nasal Cannula  Additional Equipment:   Intra-op Plan:   Post-operative Plan:   Informed Consent: I have reviewed the patients History and Physical, chart, labs and discussed the procedure including the risks, benefits and alternatives for the proposed anesthesia with the patient or authorized representative who has indicated his/her understanding and acceptance.   Patient has DNR.  Discussed DNR with patient and Suspend DNR.   Dental advisory given  Plan Discussed with: CRNA  Anesthesia Plan Comments:        Anesthesia Quick Evaluation

## 2023-09-08 NOTE — Plan of Care (Signed)

## 2023-09-08 NOTE — Discharge Instructions (Signed)
 The [facility] Shel[facility] The United Way's "U5235577" is a great source of information about community services available.  Access by dialing 2-1-1 from anywhere in [place] , or by website -  PooledIncome.pl.  [facility]) now has a full time staff to take referrals for all individuals needing shelter or housing placement. They do not do direct services or have beds, but are in charge of assessing and coordinating placement for individuals needing shelter. The phone number for coordinated entry is [phone].    Other Armed forces technical officer Number and Address  [facility] Housing for homeless and needy men with substance abuse issues 5 day Covid Quarantine [phone] [address] [place], [place]  Goldman Sachs of [place] Emergency assistance for General Mills only Ingram Micro Inc [phone] Ext. [address] [place], [place]  Clara Brunswick Corporation of the [place] Domestic violence shelter for women and their children [phone] [place], [place]  [facility] Domestic violence shelter for women and their children Each family gets their own unit and can quarantine after admission. [phone] [place], [place]  Inter[facility]) / Resources for the CIGNA center for the homeless Information and referral to housing resources Counseling Showers Laundry Barbershop Phone bank Mailroom Computer lab Medical clinic Bike maintenance center 14 Day covid quarantine [phone] [address] [place]  [address] [place], [place]  [facility] [employer] Men's Shelter Emergency housing Food Emergency financial assistance Permanent supportive housing [phone] [address] [place], [place]  The Pathmark Stores Crisis assistance Medication Housing Food Utility assistance [phone] [address] [place], [place]    [phone] [address], [place], [place]  The Monsanto Company of [place]       Transitional housing Case Chartered certified accountant assistance [phone] [address] [place], [place]  [facility] House, Pitney Bowes for adult men and women Can admit with MD clearance from the hospital after positive covid test.  Intake Hotline [phone] [address] [place], [place]  24-hour Crisis Line for those Facing Homelessness   Information and referral to community resources [phone]  Graybar Electric and additional resources. Can admit with MD clearance after positive covid test.  Prefer online applications (blocked on Cone computers)/ currently full. Will be able to do intake at the office starting in May.  [phone] Admin only location   [facility] assistance programs. If you are behind on your bills and expenses, and need some help to make it through a short term hardship or financial emergency, there are several organizations and charities in the [place] and [place] area that may be able to help. They range from the Pathmark Stores, Liberty Global, Landscape architect of [place]  and the local community action agency, the Intel, Avnet. These groups may be able to provide you resources to help pay your utility bills, rent, and they even offer housing assistance.  Crisis assistance program Find help for paying your rent, electric bills, free food, and even funds to pay your mortgage. The Liberty Global (603) 422-5946) offers several services to local families, as funding allows. The Emergency Assistance Program (EAP), which they administer, provides household goods, free food, clothing, and financial aid to people in need in the [place]  area. The EAP program does have some qualification, and counselors will interview clients for  financial assistance by written  referral only. Referrals need to be made by the Department of Social Services or by other EAP approved human services agencies or charities in the area.  Money for resources for emergency assistance are available for security deposits for rent, water , electric, and gas, past due rent, utility bills, past due mortgage payments, food, and clothing. The Liberty Global also operates a Programme researcher, broadcasting/film/video on the site. More Liberty Global.  [facility] of [employer], which can be reached at [phone], offers emergency assistance programs for those in need of help, such as food, rent assistance, a soup kitchen, shelter, and clothing. They are based in [name]  but provide a number of services to those that qualify for assistance. Continue with [facility] programs.  [facility] Department of Social Services may be able to offer temporary financial assistance and cash grants for paying rent and utilities. Help may be provided for local county residents who may be experiencing personal crisis when other resources, including government programs, are not available. Call [phone]  St. [place] everitt Mt Society, which is based in [place], provides financial assistance of up to $50.00 to help pay for rent, utilities, cooling bills, rent, and prescription medications. The program also provides secondhand furniture to those in need. [phone]  Mattel is a Geneticist, molecular. The organization can offer emergency assistance for paying rent, electric bills, utilities, food, household products and furniture. They offer extensive emergency and transitional housing for families, children and single women, and also run a Boy's and Dole Food. 301 Thrift Shops, CMS Energy Corporation, and other aid offered too. [address] [place], [place]  [address], [phone]  Additional locations  of the Pathmark Stores are in [place] and other nearby communities. When you have an emergency, need free food, money for basic needs, or just need assistance around Christmas, then the Pathmark Stores may have the resources you need. Or they can refer you to nearby agencies. Learn more.  [place] Low Income Risk manager - This is offered for [facility] families. The federal government created CIT Group Program provides a one-time cash grant payment to help eligible low-income families pay their electric and heating bills. [address], [place], [place]  [address], [phone]  Government and Motorola - The county administers several emergency and self-sufficiency programs. Residents of [place] can get help with energy bills and food, rent, and other expenses. In addition, work with a Sports coach who may be able to help you find a job or improve your employment skills. More [place] public assistance.  [place] Emergency Assistance - A program offers emergency utility and rent funds for greater [employer] area residents. The program can also provide counseling and referrals to charities and government programs. Also provides food and a free meal program that serves lunch Mondays - Saturdays and dinner seven days per week to individuals in the community. [address], [place], [place]  [address], [phone]  Parker Hann[employer]in - Offers affordable apartment and housing communities across [place] and [place]. The low income and seniors can access public housing, rental assistance to qualified applicants, and apply for the section 8 rent subsidy program. Other programs include Chiropractor and Engineer, maintenance. [address] [place], [place]  [address], dial  [phone].  Basic needs such as clothing - Low income families can receive free items (school  supplies, clothes,  holiday assistance, etc.) from clothing closets while more moderate income [facility] families can shop at Caremark Rx. Locations across the area help the needy. Get information on [place]  free clothing centers.  The [facility] provides transitional housing to veterans and the disabled. Clients will also access other services too, including life skills classes, case management, and assistance in finding permanent housing. [address], [place], [place]  [address], call [phone]  [facility] [place] Transitional Housing in [place] is for people who were just evicted or that are formerly homeless. The non-profit will also help then gain self-sufficiency, find a home or apartment to live in, and also provides information on rent assistance when needed. Dial  [phone]  [employer] Partnership to End Homelessness is available in [place]. Families that were evicted or that are homeless can gain shelter, food, clothing, furniture, and also emergency financial assistance. Other services include financial skills and life skills coaching, job training, and case management. [address], [place], [place] [address]. Telephone [phone].  The Dynegy, Avnet. runs the Ford Motor Company. This can help people save money on their heating and summer cooling bills, and is free to low income families. Free upgrades can be made to your home. Phone [phone]  Many of the non-profits and programs mentioned above are all inclusive, meaning they can meet many needs of the low income, such as energy bills, food, rent, and more. However there are several organizations that focus just on rent and housing. Read more on rent assistance in [place] region.  Legal assistance for evictions, foreclosure, and more If you need free legal advice on civili issues, such as foreclosures, evictions, Electronics engineer,  government programs, domestic issues and more, Armed forces operational officer Aid of [place]  [facility]) is a Associate Professor firm that provides free legal services and counsel to lower income people, seniors, disabled, and others. The goal is to ensure everyone has access to justice and fair representation.  Call them at [phone], or click here to learn more about [place]  free legal assistance programs.  [place] Avnet and funds for emergency expenses The Pathmark Stores is another organization that can provide people with Deere & Company and funds to pay bills. Their assistance depends on funding, and the demand for help is always very high. They can provide cash to help pay rent, a missed mortgage payment, or gas, electric, and water  bills. But the assistance doesn't stop there. They also have a food pantry on site, which can provide food once every three (3) months to people who need help. The KeyCorp can also offer a Engineering geologist once every three (3) months for a maximum three (3) times. After receiving this voucher over that period of time, applicants can receive this aid one every six (6) months after that. [phone].  Kohl's action agency The Intel, Avnet. offers job and Dispensing optician. Resources are focused on helping students obtain the skills and experiences that are necessary to compete in today's challenging and tight job market. The non-profit faith-based community action agency offers internship trainings as well as classroom instruction. Economically disadvantaged and challenged individuals and potential employers can use their services. Classes are tailored to meet the needs of people in the [facility] region. [place], [place] [address], [phone]    Foreclosure prevention services Housing Counseling and Education is also offered by MeadWestvaco of the [place]. The agency (phone number is below) is a Higher education careers adviser  certified housing counseling agency providing foreclosure advice and counseling. They offer mortgage resolution counseling and also reverse mortgage counseling. Counselors can direct people to both Kimberly-Clark, as well as [place]  foreclosure assistance options.  Warehouse manager has locations in [place] and [employer]. They run debt and foreclosure prevention programs for local families. A sampling of the programs offered include both Budget and Housing Counseling. This includes money management, financial advice, budget review and development of a written action plan with a Pensions consultant to help solve specific individual financial problems. In addition, housing and mortgage counselors can also provide pre- and post-purchase homeownership counseling, default resolution counseling (to prevent foreclosure) and reverse mortgage counseling. A Debt Management Program allows people and families with a high level of credit card or medical debt to consolidate and repay consumer debt and loans to creditors and rebuild positive credit ratings and scores. [phone]  Debt assistance programs Receive free counseling and debt help from [facility] of the [place]. The [facility] based agency can be reached at [phone]. The counselors provide free help, and the services include budget counseling. This will help people manage their expenses and set goals. They also offer a Forensic scientist, which will help individuals consolidate their debts and become debt free. Most of the workshops and services are free.  Community clinics in [place] of the leading health and dental centers are listed below. They may be able to provide medication, physicals, dental care, and general family care to residents of all incomes and backgrounds across the region. Some of the programs focus on the low income and  underinsured. However if these clinics can't meet your needs, find information and details on more clinics in [place] .  Some of the options include Marriott of [employer]. This center provides free or low cost health care to low-income adults 18 - 64, who have no health insurance. Among other services offered include a pharmacy and eye clinic. Phone [phone]  [facility], which is located in [place], is a community clinic that provides primary medical and health care to uninsured and underinsured adults and families, as well as the low income, in the greater [place] area on a sliding-fee scale. Call [phone]  [place] Adult Dental Program - They run a dental assistance program that is organized by [facility] to provide dental services and aid to Sempra Energy. Services offered by the dental clinic are limited to extractions, pain management, and minor restorative care. [phone]  [place] Child Health has locations in [facility] and [place]. The community clinics provide complete pediatric care including primary health, mental health, social work, neurology, cardiology, asthma. Dial  [phone] [phone].  In addition to those [place] and [employer] community clinics, find other free community clinics in   and across the county.  Food pantry and assistance Some of the local food pantries and distribution centers to call for free food and groceries include The Hive of [place] (phone [phone]), The [facility] (phone [phone]) and also PPL Corporation. Dial  ([phone] [phone].  Several other food banks in the region provide clothing, free food and meals, access to soup kitchens and other help. Find the addresses and phone numbers of more food pantries in [place]. http://www.needhelppayingbills.com/html/[place]_assistance_pro.html    SUNDAYS BREAKFAST TWO LOCATIONS: 8:00am served in [facility] by Awaken PPL Corporation 8:30am SHUTTLE provided from  [facility], served at Apache Corporation, [address] LUNCH TWO LOCATIONS [plus one additional third Sunday only] 10:30am - 12:30pm served at Ecolab, Liberty Global, [place] [address] (1.2 miles from [facility]) 12:30pm served in [place] by Land O'Lakes Team (THIRD Sunday only) 1:30pm served at [facility] by [facility] one location [plus one additional third Sunday only] 5:00pm Every Sunday, served under the bridge at [address] by Dovie Under the 3M Company (.7 miles from [facility]) (THIRD Sunday ONLY) 4:00pm served in the parking garage, across from Nucor Corporation, corner of [place] and [place] by Ryland Group Works Ministries MONDAYS BREAKFAST 7:30am served in Nucor Corporation by the United States Steel Corporation and Friends LUNCH 10:30am - 12:30pm served at Ecolab, Liberty Global, [place] [address] (1.2 miles from [facility]) DINNER TWO LOCATIONS: 7:00pm served in front of the courthouse at the corner of Goldman Sachs and Advanced Micro Devices. by [place] Monday Night Meal (3 blocks from [facility]) 4:30pm served at the AutoNation, [address] by Bank of [place] Company (0.6 miles from [facility] TUESDAYS BREAKFAST 8:00am - 9:00am served at The TJX Companies, [address] (0.3 miles from [place]) LUNCH 10:30am - 12:30pm served at the Ecolab, Liberty Global [address], (1.2 miles from [place]) DINNER 6:00pm served at CSX Corporation, enter from Capital One and go to the Sonic Automotive, (0.7 miles from [place]) [facility] BREAKFAST 7:00am - 8:00am served at Ecolab, Liberty Global [address] (1.2 miles from [place]) LUNCH ONE LOCATION [plus two additional locations listed below] 10:30am - 12:30pm served at Ecolab, Liberty Global [address] (1.2 miles from [place]) (FIRST Wednesday ONLY) 11:30am served at Dillard's, [place] [address] (6.6 miles from [place]) (SECOND Wednesday ONLY) 11:00am served at [place]. Garnette Ava Blackwood of [address], [address] (1.3 miles from [place]) [place] TWO LOCATIONS 6:00pm served at W. R. Berkley, [place] W. Visteon Corporation. (1.3 miles from [facility]) 4:00pm - 6:00pm (hot dogs and chips) served at Levi Strauss of [facility], [address] (1.7 miles from [place]) [place] BREAKFAST NOT AVAILABLE AT THIS TIME LUNCH 10:30am - 12:30pm served at Ecolab, Liberty Global, [place] [address] (1.2 miles from [place]) DINNER 6:00pm served at CSX Corporation, enter from Capital One and go to the Sonic Automotive, (0.7 miles from Nucor Corporation) [place] BREAKFAST NOT AVAILABLE AT THIS TIME LUNCH 10:30am - 12:30pm served at Ecolab, Liberty Global [address] (1.2 miles from [place]) DINNER TWO LOCATIONS, [plus one additional first Friday only] 6:00pm served under the bridge at [address] by Dovie Under CSX Corporation. (.7 miles from [facility]) 5:00pm - 7:00pm served at Levi Strauss of [facility], [address] (1.7 miles from [place]) (FIRST Friday ONLY) 5:45 pm - SHUTTLE provided from the LIBRARY at 5:45pm. Served at [facility], [address]. SATURDAYS BREAKFAST TWO LOCATIONS [plus one additional last Saturday only] 8:00am served at [facility] by Delphi 8:30am served at Pulte Homes, [address] (2.2 miles from [facility]) (LAST  Saturday ONLY) 8:30am served at  Beazer Homes, [address] (5 miles from [place]) LUNCH 10:30am - 12:30pm served at Ecolab, Liberty Global [address] (1.2 miles from [facility]) DINNER 6:00pm served under the bridge at [address] by World Fuel Services Corporation (0.7 miles from Nucor Corporation)  DIRECTIONS FROM [place] TO ALL MEAL LOCATIONS The Bridge at [address] (.7 miles from [address]) [address] on [place]. Turn Right onto DIRECTV 433 ft. Continue onto [address] under bridge, about 500 ft. Courthouse (3 blocks from [facility]) [place] on [address]. Turn right on [place]  1 block to PPL Corporation (.5 miles from [place]) [place] on [place]. YRC Worldwide. past Brink's Company to EMCOR. Enter from Capital One and go to the Affiliated Computer Services building W. R. Berkley [address] Visteon Corporation. (1.3 miles from [facility]) [address] on [place]. Turn Right onto [address]. Jama Cassis. church will be on the Left. The TJX Companies [address] (.3 miles from [facility]) Go .3 miles on W. Friendly Destination is on your right Dillard's at ONEOK (6.6 miles from [address]) [address] on [place] toward W Friendly Turn right onto W Friendly Continue onto Alcoa Inc. Continue onto Toll Brothers. 5. Tommi is on right Sanmina-SCI Futures trader) [address] (.6 miles from [place]) [place] on [place]. [address] Turn Left onto [address]  [address] 0.3 miles Destination is on the Left. [place] Black & Decker at American Express (5 miles from Nucor Corporation) 1. Head south on [address]. Turn right onto [address] Friendly Turn slightly left onto Quest Diagnostics Continue onto Quest Diagnostics Turn right at Barnes & Noble Continue to church on right New Birth Sounds of [facility] [address] (1.7 miles  from [place]) [address] on [place] 1.4 miles [place] becomes [place]. Elm [address] Continue 0.6 miles and church will be on theright. [place] Guardian Life Insurance at [address] (2.5 miles from Nucor Corporation) [place] provided from Massachusetts Mutual Life Park] [place] on [place]. Elm toward Estée Lauder right onto Costco Wholesale left onto Emerson Electric Turn left onto Micron Technology [address] (2.2 miles from [address]) [address] on [place] 1.4 miles [place] becomes [place]. Elm [address] Turn right onto [address]  [address] and church will be on the Left. Potter's House/[place] AT&T [address] (1.2 miles from [facility]) 1.Turn right onto [facility] 2.Turn left onto GEANNIE Beagle 3.Micheline MICAEL Jama 4.Destination is on your right [place]. Garnette Ava Tommi of [address] at ToysRus (1.3 miles from [facility]) [address] on [address] Turn left onto Genuine Parts right onto S. Maryellen Cassis. Continue onto KB Home	[place]. Turn left onto Smurfit-Stone Container. Turn right onto WellPoint.    Low Income Public Housing  [facility] Bally Supported Living Apts [phone] W. [address] Mulligan., [facility] [phone]  [facility] [address] [place] [phone]  [facility] [address] [place] [phone]  [facility] [address] [place]. [place] [phone]  [place] Apts. [address] [place] [phone]  Parkside Apts.  [address] [place] [phone] Homes [address], [place] [phone]  [facility] [address] [place]LEEN [phone]  [facility] [address] [place] [phone]  THP Apts. [address]  [facility] [phone]  [facility]  [address] [place] [phone]  [place] Apts.  [address] [facility] [address] [place] [phone]   [place] Apts. II [address] [place] [phone]  [place] Housing [address] [place] [phone] [address] [place] [phone]  [place] [facility] Apts. [address], [place] [phone]  Gardeng[place] Apts. [address] [facility] [address] [place] [phone]  [facility] Apts. [address] [facility] [phone]  Rockwell Automation Apts.  [address], [place] [phone]  [facility] Apts. [address], [place] [phone] Apts.  [address] [address] [phone]  [facility] [address] [place] [phone]  [address]  [address] [name] [phone]

## 2023-09-08 NOTE — Progress Notes (Signed)
 [facility] Progress Note   Subjective: Seen in room. No new events overnight. Breathing ok. Legs hurting. Plan for dialysis today.   Objective Vitals:   09/07/23 2156 09/08/23 0518 09/08/23 0828 09/08/23 0953  BP: 130/72 128/64  134/61  Pulse: 86 63  66  Resp: 18 17  17   Temp: 97.7 F (36.5 C) (!) 97.5 F (36.4 C)    TempSrc: Oral Oral    SpO2: 95% 97% 96% 97%     Additional Objective Labs: Basic Metabolic Panel: Recent Labs  Lab 09/06/23 1543 09/07/23 0923 09/08/23 0644  NA 135 134* 133*  K 3.4* 3.6 3.1*  CL 103 104 105  CO2 13* 13* 11*  GLUCOSE 90 95 101*  BUN 81* 78* 78*  CREATININE 6.40* 6.15* 6.18*  CALCIUM  7.0* 6.9* 6.9*  PHOS  --   --  6.6*   CBC: Recent Labs  Lab 09/06/23 1543 09/07/23 0923 09/08/23 0644  WBC 6.4 7.2 5.7  NEUTROABS 4.6  --   --   HGB 4.3* 8.9* 7.5*  HCT 13.7* 26.7* 22.7*  MCV 108.7* 94.3 94.6  PLT 383 380 337   Blood Culture    Component Value Date/Time   SDES BLOOD LEFT UPPER ARM 06/24/2023 1641   SDES BLOOD SITE NOT SPECIFIED 06/24/2023 1641   SPECREQUEST  06/24/2023 1641    BOTTLES DRAWN AEROBIC AND ANAEROBIC Blood Culture adequate volume   SPECREQUEST  06/24/2023 1641    BOTTLES DRAWN AEROBIC AND ANAEROBIC Blood Culture results may not be optimal due to an inadequate volume of blood received in culture bottles   CULT  06/24/2023 1641    NO GROWTH 5 DAYS Performed at [facility], [address], [place], [place] [address]    CULT  06/24/2023 1641    NO GROWTH 5 DAYS Performed at [facility], [address], [place], [place] [address]    REPTSTATUS 06/29/2023 FINAL 06/24/2023 1641   REPTSTATUS 06/29/2023 FINAL 06/24/2023 1641     Physical Exam General: Chronically ill appearing, nad Heart: RRR Lungs: Clear Abdomen: soft non-tender Extremities: No LE edema  Dialysis Access: L AVF +T/B   Medications:   sodium chloride    Intravenous Once   amLODipine   10 mg Oral Daily    atorvastatin   10 mg Oral Daily   budesonide -glycopyrrolate -formoterol   2 puff Inhalation BID   calcitRIOL   0.25 mcg Oral Daily   carvedilol   6.25 mg Oral BID WC   Chlorhexidine  Gluconate Cloth  6 each Topical Q0600   ferrous sulfate   325 mg Oral QODAY   folic acid   1 mg Oral Daily   gabapentin   100 mg Oral TID   multivitamin with minerals  1 tablet Oral Daily   nicotine   14 mg Transdermal Daily   pantoprazole   40 mg Oral BID   sertraline   25 mg Oral Daily   sodium bicarbonate   1,300 mg Oral BID   sodium chloride  flush  3 mL Intravenous Q12H   sucralfate   1 g Oral QID   thiamine   100 mg Oral Daily   torsemide   100 mg Oral Daily    Dialysis Orders: No outpatient unit.   Assessment/Plan: Dyspnea: Pulm edema/effusions on CXR and with COPD/rhonchi as well. Would also benefit from nebulizer/inhalers. Plan for HD today.  Symptomatic anemia: Hgb 4.3 on admit. No signs of GIB, but she has Hx. S/p 3 units PRBCs so far. Aranesp  100 given 6/22. GI planning for SBE this admission.   ESRD:  Does not have center, refuses regular HD. Does have some residual function. Last HD appears to be 4/10. For HD today.  Since has been > 2months, will start with short time and low BFR.  Hypertension/volume: BP ok, meds resumed. No gross volume overload  Metabolic bone disease: CorrCa ok, Phos 6.6.   Nutrition: Alb low, adding supps while she is here.  Severe leg pain/weakness: Possibly related to the above, she is high risk for PAD.  COPD  Cynthia Ronnald Acosta PA-C [facility] 09/08/2023,12:16 PM

## 2023-09-08 NOTE — Plan of Care (Signed)
  Problem: Activity: Goal: Risk for activity intolerance will decrease Outcome: Progressing   Problem: Nutrition: Goal: Adequate nutrition will be maintained Outcome: Progressing   Problem: Coping: Goal: Level of anxiety will decrease Outcome: Progressing   Problem: Pain Managment: Goal: General experience of comfort will improve and/or be controlled Outcome: Progressing   Problem: Safety: Goal: Ability to remain free from injury will improve Outcome: Progressing

## 2023-09-09 ENCOUNTER — Inpatient Hospital Stay (HOSPITAL_COMMUNITY): Payer: Self-pay | Admitting: Certified Registered"

## 2023-09-09 ENCOUNTER — Encounter (HOSPITAL_COMMUNITY)
Admission: EM | Discharge status: Against Medical Advice | Payer: Self-pay | Source: Home / Self Care | Attending: Family Medicine

## 2023-09-09 ENCOUNTER — Encounter (HOSPITAL_COMMUNITY): Payer: Self-pay

## 2023-09-09 DIAGNOSIS — I5032 Chronic diastolic (congestive) heart failure: Secondary | ICD-10-CM

## 2023-09-09 DIAGNOSIS — I110 Hypertensive heart disease with heart failure: Secondary | ICD-10-CM

## 2023-09-09 DIAGNOSIS — J449 Chronic obstructive pulmonary disease, unspecified: Secondary | ICD-10-CM

## 2023-09-09 DIAGNOSIS — D649 Anemia, unspecified: Secondary | ICD-10-CM

## 2023-09-09 DIAGNOSIS — K3189 Other diseases of stomach and duodenum: Secondary | ICD-10-CM

## 2023-09-09 DIAGNOSIS — K222 Esophageal obstruction: Secondary | ICD-10-CM

## 2023-09-09 DIAGNOSIS — K449 Diaphragmatic hernia without obstruction or gangrene: Secondary | ICD-10-CM

## 2023-09-09 DIAGNOSIS — K5520 Angiodysplasia of colon without hemorrhage: Secondary | ICD-10-CM

## 2023-09-09 HISTORY — PX: ENTEROSCOPY: SHX5533

## 2023-09-09 HISTORY — PX: HOT HEMOSTASIS: SHX5433

## 2023-09-09 LAB — CBC
HCT: 21.6 % — ABNORMAL LOW (ref 36.0–46.0)
Hemoglobin: 7.3 g/dL — ABNORMAL LOW (ref 12.0–15.0)
MCH: 30.5 pg (ref 26.0–34.0)
MCHC: 33.8 g/dL (ref 30.0–36.0)
MCV: 90.4 fL (ref 80.0–100.0)
Platelets: 319 10*3/uL (ref 150–400)
RBC: 2.39 MIL/uL — ABNORMAL LOW (ref 3.87–5.11)
RDW: 19.8 % — ABNORMAL HIGH (ref 11.5–15.5)
WBC: 5.5 10*3/uL (ref 4.0–10.5)
nRBC: 1.1 % — ABNORMAL HIGH (ref 0.0–0.2)

## 2023-09-09 LAB — BASIC METABOLIC PANEL WITH GFR
Anion gap: 12 (ref 5–15)
BUN: 39 mg/dL — ABNORMAL HIGH (ref 8–23)
CO2: 21 mmol/L — ABNORMAL LOW (ref 22–32)
Calcium: 7.1 mg/dL — ABNORMAL LOW (ref 8.9–10.3)
Chloride: 101 mmol/L (ref 98–111)
Creatinine, Ser: 3.7 mg/dL — ABNORMAL HIGH (ref 0.44–1.00)
GFR, Estimated: 13 mL/min — ABNORMAL LOW (ref >60)
Glucose, Bld: 87 mg/dL (ref 70–99)
Potassium: 2.7 mmol/L — CL (ref 3.5–5.1)
Sodium: 134 mmol/L — ABNORMAL LOW (ref 135–145)

## 2023-09-09 LAB — PHOSPHORUS: Phosphorus: 3.6 mg/dL (ref 2.5–4.6)

## 2023-09-09 LAB — HEPATITIS B SURFACE ANTIBODY, QUANTITATIVE: Hep B S AB Quant (Post): <3.5 m[IU]/mL — ABNORMAL LOW

## 2023-09-09 LAB — MAGNESIUM: Magnesium: 1.7 mg/dL (ref 1.7–2.4)

## 2023-09-09 SURGERY — ENTEROSCOPY
Anesthesia: Monitor Anesthesia Care

## 2023-09-09 MED ORDER — NICOTINE 21 MG/24HR TD PT24
21.0000 mg | MEDICATED_PATCH | Freq: Every day | TRANSDERMAL | Status: DC
  Administered 2023-09-11 – 2023-09-18 (×8): 21 mg via TRANSDERMAL
  Filled 2023-09-09 (×10): qty 1

## 2023-09-09 MED ORDER — GLUCAGON HCL RDNA (DIAGNOSTIC) 1 MG IJ SOLR
INTRAMUSCULAR | Status: AC
Start: 2023-09-09 — End: 2023-09-09
  Filled 2023-09-09: qty 1

## 2023-09-09 MED ORDER — POTASSIUM CHLORIDE 10 MEQ/100ML IV SOLN
10.0000 meq | INTRAVENOUS | Status: DC
  Administered 2023-09-09: 10 meq via INTRAVENOUS
  Filled 2023-09-09 (×3): qty 100

## 2023-09-09 MED ORDER — GLUCAGON HCL RDNA (DIAGNOSTIC) 1 MG IJ SOLR
INTRAMUSCULAR | Status: DC | PRN
Start: 2023-09-09 — End: 2023-09-09
  Administered 2023-09-09: .2 mg via INTRAVENOUS

## 2023-09-09 MED ORDER — POTASSIUM CHLORIDE 10 MEQ/100ML IV SOLN
10.0000 meq | INTRAVENOUS | Status: AC
  Administered 2023-09-09 (×2): 10 meq via INTRAVENOUS
  Filled 2023-09-09 (×4): qty 100

## 2023-09-09 MED ORDER — SODIUM CHLORIDE 0.9 % IV SOLN
INTRAVENOUS | Status: DC | PRN
Start: 2023-09-09 — End: 2023-09-09

## 2023-09-09 MED ORDER — PROPOFOL 500 MG/50ML IV EMUL
INTRAVENOUS | Status: DC | PRN
  Administered 2023-09-09: 125 ug/kg/min via INTRAVENOUS

## 2023-09-09 MED ORDER — PHENYLEPHRINE 80 MCG/ML (10ML) SYRINGE FOR IV PUSH (FOR BLOOD PRESSURE SUPPORT)
PREFILLED_SYRINGE | INTRAVENOUS | Status: DC | PRN
  Administered 2023-09-09: 160 ug via INTRAVENOUS

## 2023-09-09 MED ORDER — POTASSIUM CHLORIDE 20 MEQ PO PACK
40.0000 meq | PACK | Freq: Once | ORAL | Status: AC
  Administered 2023-09-09: 40 meq via ORAL
  Filled 2023-09-09 (×2): qty 2

## 2023-09-09 MED ORDER — SODIUM CHLORIDE 0.9 % IV SOLN
INTRAVENOUS | Status: AC | PRN
  Administered 2023-09-09: 500 mL via INTRAMUSCULAR

## 2023-09-09 NOTE — Plan of Care (Signed)

## 2023-09-09 NOTE — Interval H&P Note (Signed)
 History and Physical Interval Note:  09/09/2023 8:27 AM  [name]  has presented today for surgery, with the diagnosis of Anemia, history of gastric/small bowel angioectasias.  The various methods of treatment have been discussed with the patient and family. After consideration of risks, benefits and other options for treatment, the patient has consented to  Procedure(s): ENTEROSCOPY (N/A) as a surgical intervention.  The patient's history has been reviewed, patient examined, no change in status, stable for surgery.  I have reviewed the patient's chart and labs.  Questions were answered to the patient's satisfaction.     Amariana Mirando C Chayce Rullo

## 2023-09-09 NOTE — Progress Notes (Signed)
 Tele box [id number] removed from pt and placed in drawer at nurses station

## 2023-09-09 NOTE — Progress Notes (Signed)
 This RN found the patient room was smelling of cigrattes smoke ,she did  admitted she smoked the cigratte in the room after returning back from HD  .With the help of charge nurse we got the lighter from her but denied having any more cigrattes packet.

## 2023-09-09 NOTE — Progress Notes (Signed)
 Critical lab reported as patient just left unit for procedure. MD Leotis made aware.

## 2023-09-09 NOTE — Progress Notes (Signed)
 Pt arrived back to 6 north room 5 alert and oriented x4. Pain level 2/10. POT IV hung and POT packets given. Also messaged pharmacy for them to reschedule POT IV because she get it x3 and the first dose was late due to her being off unit.

## 2023-09-09 NOTE — Progress Notes (Signed)
 [facility] Progress Note   Subjective: Seen in room. Had dialysis overnight --net UF 1L.  EGD done this am.  Receiving IV potassium for K 2.7. Has been given information on shelters, needs to call.   Objective Vitals:   09/09/23 0950 09/09/23 1000 09/09/23 1033 09/09/23 1047  BP: (!) 159/64 (!) 152/64 (!) 155/62 (!) 155/62  Pulse: 60 60 68   Resp: 17 14 16    Temp:   (!) 97.5 F (36.4 C)   TempSrc:   Oral   SpO2: 95% 93% 100%   Height:        Additional Objective Labs: Basic Metabolic Panel: Recent Labs  Lab 09/07/23 0923 09/08/23 0644 09/09/23 0628  NA 134* 133* 134*  K 3.6 3.1* 2.7*  CL 104 105 101  CO2 13* 11* 21*  GLUCOSE 95 101* 87  BUN 78* 78* 39*  CREATININE 6.15* 6.18* 3.70*  CALCIUM  6.9* 6.9* 7.1*  PHOS  --  6.6* 3.6   CBC: Recent Labs  Lab 09/06/23 1543 09/07/23 0923 09/08/23 0644 09/09/23 0628  WBC 6.4 7.2 5.7 5.5  NEUTROABS 4.6  --   --   --   HGB 4.3* 8.9* 7.5* 7.3*  HCT 13.7* 26.7* 22.7* 21.6*  MCV 108.7* 94.3 94.6 90.4  PLT 383 380 337 319   Blood Culture    Component Value Date/Time   SDES BLOOD LEFT UPPER ARM 06/24/2023 1641   SDES BLOOD SITE NOT SPECIFIED 06/24/2023 1641   SPECREQUEST  06/24/2023 1641    BOTTLES DRAWN AEROBIC AND ANAEROBIC Blood Culture adequate volume   SPECREQUEST  06/24/2023 1641    BOTTLES DRAWN AEROBIC AND ANAEROBIC Blood Culture results may not be optimal due to an inadequate volume of blood received in culture bottles   CULT  06/24/2023 1641    NO GROWTH 5 DAYS Performed at [facility], [address] [place], [place] [address]    CULT  06/24/2023 1641    NO GROWTH 5 DAYS Performed at [facility], [address], [place], [place] [address]    REPTSTATUS 06/29/2023 FINAL 06/24/2023 1641   REPTSTATUS 06/29/2023 FINAL 06/24/2023 1641     Physical Exam General: Chronically ill appearing, nad Heart: RRR Lungs: Clear Abdomen: soft non-tender Extremities: No LE edema   Dialysis Access: L AVF +T/B   Medications:  potassium chloride  10 mEq (09/09/23 1346)    sodium chloride    Intravenous Once   amLODipine   10 mg Oral Daily   atorvastatin   10 mg Oral Daily   budesonide -glycopyrrolate -formoterol   2 puff Inhalation BID   calcitRIOL   0.25 mcg Oral Daily   carvedilol   6.25 mg Oral BID WC   Chlorhexidine  Gluconate Cloth  6 each Topical Q0600   ferrous sulfate   325 mg Oral QODAY   folic acid   1 mg Oral Daily   gabapentin   100 mg Oral TID   multivitamin with minerals  1 tablet Oral Daily   nicotine   21 mg Transdermal Daily   pantoprazole   40 mg Oral BID   sertraline   25 mg Oral Daily   sodium bicarbonate   1,300 mg Oral BID   sodium chloride  flush  3 mL Intravenous Q12H   sucralfate   1 g Oral QID   thiamine   100 mg Oral Daily   torsemide   100 mg Oral Daily    Dialysis Orders: No outpatient unit.   Assessment/Plan: ESRD:  Does not have center, refuses regular HD. Does have some residual function. Last HD  appears to be 4/10.  Since has been > 2months, will start with short time and low BFR. Had HD Monday night. Will try for another HD Wed. Optimize while she is inpatient. Not sure there is much else to be done. She is experiencing homelessness so can not be placed in an outpatient dialysis clinic.  Hypokalemia. KCl ordered. Follow labs.  Dyspnea: Pulm edema/effusions on CXR and with COPD/rhonchi as well. Volume removal with HD as tolerated.  Acute blood loss anemia.  Hgb 4.3 on admit. No signs of GIB, but she has Hx. S/p 3 units PRBCs. Aranesp  100 given 6/22. S/p SBE - multiple non-bleeding AVMs found. Treated with APC Hypertension/volume: BP ok, meds resumed. No gross volume overload Metabolic bone disease: CorrCa ok, Phos 6.6.  Nutrition: Alb low, adding supps while she is here. Severe leg pain/weakness: Possibly related to the above, she is high risk for PAD. COPD Breztri  ordered. Per primary   Cynthia Ronnald Acosta PA-C [facility] 09/09/2023,2:02 PM

## 2023-09-09 NOTE — Progress Notes (Signed)
 New order placed for 3x POT IV and POT packet.

## 2023-09-09 NOTE — Progress Notes (Signed)
 PROGRESS NOTE    [name]  FMW:[id number] DOB: May 15, 1960 DOA: 09/06/2023 PCP: Vicci Barnie NOVAK, MD   Brief Narrative:  This 63 yrs. old female with PMH significant for tobacco use, polysubstance use, End-stage renal disease not on dialysis due to homelessness, history of chronic GI bleeding due to AVMs, GERD, PUD, COPD, HTN, Chronic leg pain who presents to the ED stating it is time for her to get dialysis.  She reports needing dialysis but unable to get this done due to homelessness.  She has chronic anemia with a baseline hemoglobin of 8-10.  In the ED she was noted to have a hemoglobin of 4.3.  Her creatinine was 6.4, her potassium was 3.4.  The patient had a chest x-ray that showed a right pleural effusion and findings consistent with CHF.  Patient does have a history of congestive heart failure and is on beta-blockers plus diuretics for this.  Patient was given three units PRBC . Nephrology and Gastroenterology was consulted. She was admitted for further evaluation.  Assessment & Plan:   Principal Problem:   Acute blood loss anemia Active Problems:   Chronic diastolic CHF (congestive heart failure) (HCC)   Acute on chronic anemia   ESRD on dialysis [facility])   Essential hypertension   COPD (chronic obstructive pulmonary disease) (HCC)   Anxiety and depression   GERD (gastroesophageal reflux disease)   Hyperlipidemia   Gastric AVM   Tobacco abuse   Chronic leg pain   Polysubstance abuse (HCC)   Angiodysplasia of upper gastrointestinal tract  Acute blood loss anemia: She is found to have hemoglobin 4.3. No acute visible bleeding noted. Transfused 3 units PRBC. Hb improved to 8.9. Follow-up H&H.   Acute on chronic anemia: Patient with end-stage renal disease and chronic anemia. Baseline hemoglobin is between 8 and 10. Patient has Zaccone history of upper GI bleeding due to GI AVMs and gastritis. Patient gives no reports of acute GI bleeding, denies hematemesis, melena  however she does take iron  daily and her stools are dark. Patient's hemoglobin is acutely low at 4.3. S/p 2 units PRBC. Hb improved to 8.9 GI consulted status post EGD found to have 4 non bleeding AVMs in the duodenum and 2 in the jejunum treated with APC (cautery). GI recommended iron  supplementation and GI signed off.   Chronic diastolic CHF (congestive heart failure) (HCC): Continue blood pressure control. Continue Demadex  daily. Currently with right pleural effusion and findings of CHF.   She will get significant volume with blood transfusion and will need some increased diuresis. Continue Lasix  as needed.  ESRD on dialysis [facility]): Patient does have end-stage renal disease however due to her homelessness, She is unable to get chronic dialysis.  She continues to make urine. Nephrology has been consulted, patient states now its  time to get hemodialysis. The patient reports getting dialyzed occasionally when she feels like she needs it.   Her BUN is up to 81 but otherwise has no other acute need for dialysis at this time. Nephrology consulted.  Patient underwent hemodialysis. Continue with dialysis per nephrology.   Essential hypertension: Continue amlodipine , carvedilol .   COPD (chronic obstructive pulmonary disease) (HCC) Continue inhalers.   Anxiety and depression: Continue sertraline , trazodone .   Gastric AVM: Has had this in the past and needed APC. Asked EDP to consult GI to potentially see her in case this is needed again specifically if she has acute GI bleeding.   Hyperlipidemia: Low-cholesterol diet.   GERD (gastroesophageal reflux disease) Continue  PPI, sucralfate .   Tobacco abuse: Patient reports cutting down on smoking.   She was offered and accepted nicotine  patch.   Polysubstance abuse [facility]) Patient continues to use cocaine sporadically.   She reports last use was approximately 4 to 5 days ago.   She denies any IV drug use. Social worker  consult.    DVT prophylaxis:SCDs Code Status: DNR Family Communication: No family at bed side. Disposition Plan:    Status is: Inpatient Remains inpatient appropriate because: Admitted for significant anemia and AKI requiring hemodialysis.  Nephrology and gastroenterology is consulted     Consultants:  Nephrology Gastroenterology  Procedures: None  Antimicrobials:  Anti-infectives (From admission, onward)    None      Subjective: Patient was seen and examined at bedside. Overnight events noted. Patient reports feeling improved.  Hemodialysis yesterday. She underwent endoscopy found to have nonbleeding AVMs in the duodenum and jejunum treated with APC   Objective: Vitals:   09/09/23 0950 09/09/23 1000 09/09/23 1033 09/09/23 1047  BP: (!) 159/64 (!) 152/64 (!) 155/62 (!) 155/62  Pulse: 60 60 68   Resp: 17 14 16    Temp:   (!) 97.5 F (36.4 C)   TempSrc:   Oral   SpO2: 95% 93% 100%   Height:        Intake/Output Summary (Last 24 hours) at 09/09/2023 1427 Last data filed at 09/09/2023 9071 Gross per 24 hour  Intake 319.03 ml  Output 1000 ml  Net -680.97 ml   Filed Weights    Examination:  General exam: Appears calm and comfortable, deconditioned, not in any acute distress. Respiratory system: CTA Blaterally. Respiratory effort normal.  RR 15 Cardiovascular system: S1 & S2 heard, RRR. No JVD, murmurs, rubs, gallops or clicks. No pedal edema. Gastrointestinal system: Abdomen is non distended, soft and non tender.  Normal bowel sounds heard. Central nervous system: Alert and oriented x 3. No focal neurological deficits. Extremities: No edema, no cyanosis, no clubbing. Skin: No rashes, lesions or ulcers Psychiatry: Judgement and insight appear normal. Mood & affect appropriate.     Data Reviewed: I have personally reviewed following labs and imaging studies  CBC: Recent Labs  Lab 09/06/23 1543 09/07/23 0923 09/08/23 0644 09/09/23 0628  WBC 6.4 7.2  5.7 5.5  NEUTROABS 4.6  --   --   --   HGB 4.3* 8.9* 7.5* 7.3*  HCT 13.7* 26.7* 22.7* 21.6*  MCV 108.7* 94.3 94.6 90.4  PLT 383 380 337 319   Basic Metabolic Panel: Recent Labs  Lab 09/06/23 1543 09/07/23 0923 09/08/23 0644 09/09/23 0628  NA 135 134* 133* 134*  K 3.4* 3.6 3.1* 2.7*  CL 103 104 105 101  CO2 13* 13* 11* 21*  GLUCOSE 90 95 101* 87  BUN 81* 78* 78* 39*  CREATININE 6.40* 6.15* 6.18* 3.70*  CALCIUM  7.0* 6.9* 6.9* 7.1*  MG  --   --  1.7 1.7  PHOS  --   --  6.6* 3.6   GFR: CrCl cannot be calculated (Unknown ideal weight.). Liver Function Tests: Recent Labs  Lab 09/06/23 1543 09/07/23 0923  AST 17 15  ALT 10 9  ALKPHOS 72 81  BILITOT 0.5 0.5  PROT 6.1* 6.1*  ALBUMIN  2.5* 2.6*   No results for input(s): LIPASE, AMYLASE in the last 168 hours. No results for input(s): AMMONIA in the last 168 hours. Coagulation Profile: No results for input(s): INR, PROTIME in the last 168 hours. Cardiac Enzymes: No results for input(s): CKTOTAL, CKMB,  CKMBINDEX, TROPONINI in the last 168 hours. BNP (last 3 results) No results for input(s): PROBNP in the last 8760 hours. HbA1C: No results for input(s): HGBA1C in the last 72 hours. CBG: No results for input(s): GLUCAP in the last 168 hours. Lipid Profile: No results for input(s): CHOL, HDL, LDLCALC, TRIG, CHOLHDL, LDLDIRECT in the last 72 hours. Thyroid Function Tests: Recent Labs    09/06/23 2322  TSH 1.733   Anemia Panel: No results for input(s): VITAMINB12, FOLATE, FERRITIN, TIBC, IRON , RETICCTPCT in the last 72 hours. Sepsis Labs: No results for input(s): PROCALCITON, LATICACIDVEN in the last 168 hours.  Recent Results (from the past 240 hours)  Resp panel by RT-PCR (RSV, Flu A&B, Covid) Anterior Nasal Swab     Status: None   Collection Time: 09/06/23  8:50 PM   Specimen: Anterior Nasal Swab  Result Value Ref Range Status   SARS Coronavirus 2 by RT PCR  NEGATIVE NEGATIVE Final   Influenza A by PCR NEGATIVE NEGATIVE Final   Influenza B by PCR NEGATIVE NEGATIVE Final    Comment: (NOTE) The Xpert Xpress SARS-CoV-2/FLU/RSV plus assay is intended as an aid in the diagnosis of influenza from Nasopharyngeal swab specimens and should not be used as a sole basis for treatment. Nasal washings and aspirates are unacceptable for Xpert Xpress SARS-CoV-2/FLU/RSV testing.  Fact Sheet for Patients: BloggerCourse.com  Fact Sheet for Healthcare Providers: SeriousBroker.it  This test is not yet approved or cleared by the [place]  FDA and has been authorized for detection and/or diagnosis of SARS-CoV-2 by FDA under an Emergency Use Authorization (EUA). This EUA will remain in effect (meaning this test can be used) for the duration of the COVID-19 declaration under Section 564(b)(1) of the Act, 21 U.S.C. section 360bbb-3(b)(1), unless the authorization is terminated or revoked.     Resp Syncytial Virus by PCR NEGATIVE NEGATIVE Final    Comment: (NOTE) Fact Sheet for Patients: BloggerCourse.com  Fact Sheet for Healthcare Providers: SeriousBroker.it  This test is not yet approved or cleared by the [place]  FDA and has been authorized for detection and/or diagnosis of SARS-CoV-2 by FDA under an Emergency Use Authorization (EUA). This EUA will remain in effect (meaning this test can be used) for the duration of the COVID-19 declaration under Section 564(b)(1) of the Act, 21 U.S.C. section 360bbb-3(b)(1), unless the authorization is terminated or revoked.  Performed at [facility], [address], [place], [place] [address]      Radiology Studies: No results found.  Scheduled Meds:  sodium chloride    Intravenous Once   amLODipine   10 mg Oral Daily   atorvastatin   10 mg Oral Daily   budesonide -glycopyrrolate -formoterol   2 puff  Inhalation BID   calcitRIOL   0.25 mcg Oral Daily   carvedilol   6.25 mg Oral BID WC   Chlorhexidine  Gluconate Cloth  6 each Topical Q0600   ferrous sulfate   325 mg Oral QODAY   folic acid   1 mg Oral Daily   gabapentin   100 mg Oral TID   multivitamin with minerals  1 tablet Oral Daily   nicotine   21 mg Transdermal Daily   pantoprazole   40 mg Oral BID   sertraline   25 mg Oral Daily   sodium bicarbonate   1,300 mg Oral BID   sodium chloride  flush  3 mL Intravenous Q12H   sucralfate   1 g Oral QID   thiamine   100 mg Oral Daily   torsemide   100 mg Oral Daily   Continuous Infusions:  potassium chloride  10 mEq (09/09/23 1346)      LOS: 3 days    Time spent: 35 mins    Darcel Dawley, MD [facility]   If 7PM-7AM, please contact night-coverage

## 2023-09-09 NOTE — Progress Notes (Signed)
 Date and time results received: 09/09/23 0742 (use smartphrase .now to insert current time)  Test: Potassium Critical Value: 2.7  Name of Provider Notified: Pardeep Khatri,MD  Orders Received? Or Actions Taken?: MD made aware. waiting response

## 2023-09-09 NOTE — Progress Notes (Signed)
 Pt transported to endoscopy procedure via wheelchair by transportation staff

## 2023-09-09 NOTE — Progress Notes (Signed)
   09/09/23 0200  Vitals  Temp 97.8 F (36.6 C)  Temp Source Oral  BP (!) 157/61  MAP (mmHg) 89  BP Location Left Arm  BP Method Automatic  Patient Position (if appropriate) Lying  Pulse Rate 73  Pulse Rate Source Monitor  ECG Heart Rate 73  Resp 14  Oxygen Therapy  SpO2 99 %  O2 Device Room Air  During Treatment Monitoring  Blood Flow Rate (mL/min) 0 mL/min  Arterial Pressure (mmHg) 0 mmHg  Venous Pressure (mmHg) 0 mmHg  TMP (mmHg) 21.61 mmHg  Ultrafiltration Rate (mL/min) 350 mL/min  Dialysate Flow Rate (mL/min) 300 ml/min  Dialysate Potassium Concentration 3  Dialysate Calcium  Concentration 2.5  Duration of HD Treatment -hour(s) 2.5 hour(s)  Cumulative Fluid Removed (mL) per Treatment  999.66  HD Safety Checks Performed Yes  Intra-Hemodialysis Comments Tx completed  Post Treatment  Dialyzer Clearance Lightly streaked  Liters Processed 35.1  Fluid Removed (mL) 1000 mL  Tolerated HD Treatment Yes  AVG/AVF Arterial Site Held (minutes) 10 minutes  AVG/AVF Venous Site Held (minutes) 10 minutes  Fistula / Graft Right Upper arm Arteriovenous fistula  Placement Date/Time: 08/14/22 1230   Orientation: Right  Access Location: (c) Upper arm  Access Type: Arteriovenous fistula  Site Condition No complications  Fistula / Graft Assessment Present;Thrill;Bruit  Status Deaccessed  Drainage Description None

## 2023-09-09 NOTE — Transfer of Care (Signed)
 Immediate Anesthesia Transfer of Care Note  Patient: [name]  Procedure(s) Performed: ENTEROSCOPY EGD, WITH ARGON PLASMA COAGULATION  Patient Location: Endoscopy Unit  Anesthesia Type:MAC  Level of Consciousness: awake and patient cooperative  Airway & Oxygen Therapy: Patient Spontanous Breathing and Patient connected to nasal cannula oxygen  Post-op Assessment: Report given to RN and Post -op Vital signs reviewed and stable  Post vital signs: Reviewed and stable  Last Vitals:  Vitals Value Taken Time  BP 88/52 09/09/23 09:30  Temp 36.6 C 09/09/23 09:29  Pulse 65 09/09/23 09:36  Resp 17 09/09/23 09:36  SpO2 98 % 09/09/23 09:36  Vitals shown include unfiled device data.  Last Pain:  Vitals:   09/09/23 0929  TempSrc: Temporal  PainSc: 0-No pain         Complications: No notable events documented.

## 2023-09-09 NOTE — Op Note (Signed)
 [facility] Patient Name: [name] Procedure Date : 09/09/2023 MRN: [id number] Attending MD: Rosario Estefana Kidney , , [id number] Date of Birth: May 21, 1960 CSN: [id number] Age: 63 Admit Type: Inpatient Procedure:                Small bowel enteroscopy Indications:              Anemia Providers:                Rosario Estefana Kidney Gregoria Odean, RN,                            Haskel Chris, Technician Referring MD:             Hospitalist team Medicines:                Monitored Anesthesia Care Complications:            No immediate complications. Estimated Blood Loss:     Estimated blood loss was minimal. Procedure:                Pre-Anesthesia Assessment:                           - Prior to the procedure, a History and Physical                            was performed, and patient medications and                            allergies were reviewed. The patient's tolerance of                            previous anesthesia was also reviewed. The risks                            and benefits of the procedure and the sedation                            options and risks were discussed with the patient.                            All questions were answered, and informed consent                            was obtained. Prior Anticoagulants: The patient has                            taken no anticoagulant or antiplatelet agents                            except for aspirin . ASA Grade Assessment: III - A                            patient with severe systemic disease. After  reviewing the risks and benefits, the patient was                            deemed in satisfactory condition to undergo the                            procedure.                           After obtaining informed consent, the endoscope was                            passed under direct vision. Throughout the                            procedure, the patient's blood  pressure, pulse, and                            oxygen saturations were monitored continuously. The                            PCF-190TL ([id number]) Olympus colonoscope was                            introduced through the mouth and advanced to the                            proximal jejunum. The small bowel enteroscopy was                            accomplished without difficulty. The patient                            tolerated the procedure well. Scope In: Scope Out: Findings:      A non-obstructing Schatzki ring was found at the gastroesophageal       junction.      A small hiatal hernia was present.      Localized mildly erythematous mucosa without bleeding was found in the       gastric antrum.      Four angioectasias with no bleeding were found in the second portion of       the duodenum, in the third portion of the duodenum and in the fourth       portion of the duodenum. Coagulation for bleeding prevention using argon       plasma at 0.8 liters/minute and 20 watts was successful.      Two angioectasias with no bleeding were found in the proximal jejunum.       Coagulation for bleeding prevention using argon plasma at 0.8       liters/minute and 20 watts was successful. Impression:               - Non-obstructing Schatzki ring.                           - Small hiatal hernia.                           -  Erythematous mucosa in the antrum.                           - Four non-bleeding angioectasias in the duodenum.                            Treated with argon plasma coagulation (APC).                           - Two non-bleeding angioectasias in the jejunum.                            Treated with argon plasma coagulation (APC).                           - No specimens collected. Recommendation:           - Return patient to hospital ward for ongoing care.                           - Recommend continued iron  supplementation.                           - We will arrange for  outpatient GI follow up to                            follow up anemia and discuss whether or not patient                            would want to pursue follow up colonoscopy.                           - The findings and recommendations were discussed                            with the patient. Procedure Code(s):        --- Professional ---                           [phone], Small intestinal endoscopy, enteroscopy                            beyond second portion of duodenum, not including                            ileum; with control of bleeding (eg, injection,                            bipolar cautery, unipolar cautery, laser, heater                            probe, stapler, plasma coagulator) Diagnosis Code(s):        --- Professional ---  K22.2, Esophageal obstruction                           K44.9, Diaphragmatic hernia without obstruction or                            gangrene                           K31.89, Other diseases of stomach and duodenum                           K31.819, Angiodysplasia of stomach and duodenum                            without bleeding                           K55.20, Angiodysplasia of colon without hemorrhage                           D64.9, Anemia, unspecified CPT copyright 2022 American Medical Association. All rights reserved. The codes documented in this report are preliminary and upon coder review may  be revised to meet current compliance requirements. Dr Estefana Federico Rosario Estefana Federico,  09/09/2023 9:26:28 AM Number of Addenda: 0

## 2023-09-10 LAB — BPAM RBC
Blood Product Expiration Date: 202507142359
Blood Product Unit Number: 202507132359
ISSUE DATE / TIME: 202506211943
ISSUE DATE / TIME: 202506212319
ISSUE DATE / TIME: 202506220454
ISSUE DATE / TIME: 202507132359
ISSUE DATE / TIME: 202507132359
Unit Type and Rh: 202507132359
Unit Type and Rh: 202507132359
Unit Type and Rh: 202507132359
Unit Type and Rh: 202507142359
Unit Type and Rh: 6200
Unit Type and Rh: 6200
Unit Type and Rh: 6200
Unit Type and Rh: 6200

## 2023-09-10 LAB — TYPE AND SCREEN
ABO/RH(D): A POS
Antibody Screen: NEGATIVE
Unit division: 0
Unit division: 0
Unit division: 0
Unit division: 0

## 2023-09-10 LAB — RENAL FUNCTION PANEL
Albumin: 2.1 g/dL — ABNORMAL LOW (ref 3.5–5.0)
Anion gap: 10 (ref 5–15)
BUN: 48 mg/dL — ABNORMAL HIGH (ref 8–23)
CO2: 18 mmol/L — ABNORMAL LOW (ref 22–32)
Calcium: 7 mg/dL — ABNORMAL LOW (ref 8.9–10.3)
Chloride: 101 mmol/L (ref 98–111)
Creatinine, Ser: 4.68 mg/dL — ABNORMAL HIGH (ref 0.44–1.00)
GFR, Estimated: 10 mL/min — ABNORMAL LOW (ref >60)
Glucose, Bld: 93 mg/dL (ref 70–99)
Phosphorus: 3.7 mg/dL (ref 2.5–4.6)
Potassium: 3.5 mmol/L (ref 3.5–5.1)
Sodium: 129 mmol/L — ABNORMAL LOW (ref 135–145)

## 2023-09-10 LAB — CBC
HCT: 19.8 % — ABNORMAL LOW (ref 36.0–46.0)
Hemoglobin: 6.5 g/dL — CL (ref 12.0–15.0)
MCH: 31 pg (ref 26.0–34.0)
MCHC: 32.8 g/dL (ref 30.0–36.0)
MCV: 94.3 fL (ref 80.0–100.0)
Platelets: 308 10*3/uL (ref 150–400)
RBC: 2.1 MIL/uL — ABNORMAL LOW (ref 3.87–5.11)
RDW: 20.7 % — ABNORMAL HIGH (ref 11.5–15.5)
WBC: 5.9 10*3/uL (ref 4.0–10.5)

## 2023-09-10 LAB — HEMOGLOBIN AND HEMATOCRIT, BLOOD
HCT: 28 % — ABNORMAL LOW (ref 36.0–46.0)
Hemoglobin: 9.3 g/dL — ABNORMAL LOW (ref 12.0–15.0)

## 2023-09-10 LAB — PREPARE RBC (CROSSMATCH)

## 2023-09-10 MED ORDER — SODIUM CHLORIDE 0.9% IV SOLUTION: Freq: Once | INTRAVENOUS | Status: DC

## 2023-09-10 NOTE — Anesthesia Postprocedure Evaluation (Signed)
 Anesthesia Post Note  Patient: [name]  Procedure(s) Performed: ENTEROSCOPY EGD, WITH ARGON PLASMA COAGULATION     Patient location during evaluation: Endoscopy Anesthesia Type: MAC Level of consciousness: awake and alert Pain management: pain level controlled Vital Signs Assessment: post-procedure vital signs reviewed and stable Respiratory status: spontaneous breathing Cardiovascular status: stable Anesthetic complications: no   No notable events documented.  Last Vitals:  Vitals:   09/10/23 0751 09/10/23 0846  BP: (!) 153/71 (!) 153/71  Pulse: 74   Resp: 18   Temp: 36.9 C   SpO2: 97%     Last Pain:  Vitals:   09/10/23 0751  TempSrc: Oral  PainSc:                  Norleen Pope

## 2023-09-10 NOTE — Progress Notes (Signed)
 Dinner tray ordered for pt

## 2023-09-10 NOTE — Progress Notes (Signed)
 Report given to hemodialysis nurse. Nurse verbalized understanding of report. Nurse also stated that she would do the 1 unit of blood in dialysis when pt arrives.

## 2023-09-10 NOTE — Care Management Important Message (Signed)
 Important Message  Patient Details  Name: PIETRA ZULUAGA MRN: [id number] Date of Birth: Mar 13, 1961   Important Message Given:  Yes - Medicare IM     Jon Cruel 09/10/2023, 10:07 AM

## 2023-09-10 NOTE — Progress Notes (Signed)
 PROGRESS NOTE    [name]  FMW:[id number] DOB: 12/17/1960 DOA: 09/06/2023 PCP: Vicci Barnie NOVAK, MD   Brief Narrative:  This 63 yrs. old female with PMH significant for tobacco use, polysubstance use, End-stage renal disease not on dialysis due to homelessness, history of chronic GI bleeding due to AVMs, GERD, PUD, COPD, HTN, Chronic leg pain who presents to the ED stating it is time for her to get dialysis.  She reports needing dialysis but unable to get this done due to homelessness.  She has chronic anemia with a baseline hemoglobin of 8-10.  In the ED she was noted to have a hemoglobin of 4.3.  Her creatinine was 6.4, her potassium was 3.4.  The patient had a chest x-ray that showed a right pleural effusion and findings consistent with CHF.  Patient does have a history of congestive heart failure and is on beta-blockers plus diuretics for this.  Patient was given three units PRBC . Nephrology and Gastroenterology was consulted. She was admitted for further evaluation.  Assessment & Plan:   Principal Problem:   Acute blood loss anemia Active Problems:   Chronic diastolic CHF (congestive heart failure) (HCC)   Acute on chronic anemia   ESRD on dialysis [facility])   Essential hypertension   COPD (chronic obstructive pulmonary disease) (HCC)   Anxiety and depression   GERD (gastroesophageal reflux disease)   Hyperlipidemia   Gastric AVM   Tobacco abuse   Chronic leg pain   Polysubstance abuse (HCC)   Angiodysplasia of upper gastrointestinal tract  Acute blood loss anemia: She is found to have hemoglobin 4.3 on arrival in ED. No acute visible bleeding noted. Transfused 3 units PRBC. Hb improved to 8.9. Follow-up H&H again dropped to 6.5 from 7.3 Transfuse 1 unit PRBC,  Monitor H/H   Acute on chronic anemia: Patient with end-stage renal disease and chronic anemia. Baseline hemoglobin is between 8 and 10. Patient has Varkey history of upper GI bleeding due to GI AVMs and  gastritis. Patient gives no reports of acute GI bleeding, denies hematemesis, melena however she does take iron  daily and her stools are dark. Patient's hemoglobin is acutely low at 4.3. S/p 3 units PRBC. Hb improved to 8.9 GI consulted status post EGD found to have 4 non bleeding AVMs in the duodenum and 2 in the jejunum treated with APC (cautery). GI recommended iron  supplementation and GI signed off.   Chronic diastolic CHF (congestive heart failure) (HCC): Continue blood pressure control. Continue Demadex  daily. Currently with right pleural effusion and findings of CHF.   She will get significant volume with blood transfusion and will need some increased diuresis. Continue Lasix  as needed.  ESRD on dialysis [facility]): Patient does have end-stage renal disease however due to her homelessness, She is unable to get chronic dialysis.   She continues to make urine.  Difficult  situation for outpatient hemodialysis set up. Nephrology has been consulted, Patient states now its  time to get hemodialysis. The patient reports getting dialyzed occasionally when she feels like she needs it.   Her BUN is up to 81 but otherwise has no other acute need for dialysis at this time. Nephrology consulted.  Patient underwent hemodialysis. Continue with dialysis per nephrology.   Essential hypertension: Continue amlodipine , carvedilol .   COPD (chronic obstructive pulmonary disease) (HCC) Continue inhalers.   Anxiety and depression: Continue sertraline , trazodone .   Gastric AVM: Has had this in the past and needed APC. Asked EDP to consult GI to potentially  see her in case this is needed again specifically if she has acute GI bleeding.   Hyperlipidemia: Low-cholesterol diet.   GERD (gastroesophageal reflux disease) Continue PPI, sucralfate .   Tobacco abuse: Patient reports cutting down on smoking.   She was offered and accepted nicotine  patch.   Polysubstance abuse [facility]) Patient continues to  use cocaine sporadically.   She reports last use was approximately 4 to 5 days ago.   She denies any IV drug use. Social worker consult.    DVT prophylaxis:SCDs Code Status: DNR Family Communication: No family at bed side. Disposition Plan:    Status is: Inpatient Remains inpatient appropriate because: Admitted for significant anemia and AKI requiring hemodialysis.   Nephrology and gastroenterology is consulted   Consultants:  Nephrology Gastroenterology  Procedures: None  Antimicrobials:  Anti-infectives (From admission, onward)    None      Subjective: Patient was seen and examined at bedside. Overnight events noted. Patient reports feeling improved.  She had hemodialysis on Monday.  She is going to have hemodialysis today. Her hemoglobin again dropped but there is no any visible bleeding.   Objective: Vitals:   09/10/23 0846 09/10/23 1321 09/10/23 1343 09/10/23 1400  BP: (!) 153/71 124/63 (!) 130/54 128/65  Pulse:  (!) 15 70 69  Resp:  (!) 69 15 14  Temp:  98.2 F (36.8 C)    TempSrc:      SpO2:  92% 96% 97%  Weight:  58.7 kg    Height:        Intake/Output Summary (Last 24 hours) at 09/10/2023 1420 Last data filed at 09/10/2023 0800 Gross per 24 hour  Intake 476 ml  Output --  Net 476 ml   Filed Weights   09/10/23 0724 09/10/23 1321  Weight: 53.3 kg 58.7 kg    Examination:  General exam: Appears calm and comfortable, deconditioned, not in any acute distress. Respiratory system: CTA Blaterally. Respiratory effort normal.  RR 15 Cardiovascular system: S1 & S2 heard, RRR. No JVD, murmurs, rubs, gallops or clicks. No pedal edema. Gastrointestinal system: Abdomen is non distended, soft and non tender.  Normal bowel sounds heard. Central nervous system: Alert and oriented x 3. No focal neurological deficits. Extremities: No edema, no cyanosis, no clubbing. Skin: No rashes, lesions or ulcers Psychiatry: Judgement and insight appear normal. Mood &  affect appropriate.     Data Reviewed: I have personally reviewed following labs and imaging studies  CBC: Recent Labs  Lab 09/06/23 1543 09/07/23 0923 09/08/23 0644 09/09/23 0628 09/10/23 0542  WBC 6.4 7.2 5.7 5.5 5.9  NEUTROABS 4.6  --   --   --   --   HGB 4.3* 8.9* 7.5* 7.3* 6.5*  HCT 13.7* 26.7* 22.7* 21.6* 19.8*  MCV 108.7* 94.3 94.6 90.4 94.3  PLT 383 380 337 319 308   Basic Metabolic Panel: Recent Labs  Lab 09/06/23 1543 09/07/23 0923 09/08/23 0644 09/09/23 0628 09/10/23 0542  NA 135 134* 133* 134* 129*  K 3.4* 3.6 3.1* 2.7* 3.5  CL 103 104 105 101 101  CO2 13* 13* 11* 21* 18*  GLUCOSE 90 95 101* 87 93  BUN 81* 78* 78* 39* 48*  CREATININE 6.40* 6.15* 6.18* 3.70* 4.68*  CALCIUM  7.0* 6.9* 6.9* 7.1* 7.0*  MG  --   --  1.7 1.7  --   PHOS  --   --  6.6* 3.6 3.7   GFR: Estimated Creatinine Clearance: 11.4 mL/min (A) (by C-G formula based on SCr of  4.68 mg/dL (H)). Liver Function Tests: Recent Labs  Lab 09/06/23 1543 09/07/23 0923 09/10/23 0542  AST 17 15  --   ALT 10 9  --   ALKPHOS 72 81  --   BILITOT 0.5 0.5  --   PROT 6.1* 6.1*  --   ALBUMIN  2.5* 2.6* 2.1*   No results for input(s): LIPASE, AMYLASE in the last 168 hours. No results for input(s): AMMONIA in the last 168 hours. Coagulation Profile: No results for input(s): INR, PROTIME in the last 168 hours. Cardiac Enzymes: No results for input(s): CKTOTAL, CKMB, CKMBINDEX, TROPONINI in the last 168 hours. BNP (last 3 results) No results for input(s): PROBNP in the last 8760 hours. HbA1C: No results for input(s): HGBA1C in the last 72 hours. CBG: No results for input(s): GLUCAP in the last 168 hours. Lipid Profile: No results for input(s): CHOL, HDL, LDLCALC, TRIG, CHOLHDL, LDLDIRECT in the last 72 hours. Thyroid Function Tests: No results for input(s): TSH, T4TOTAL, FREET4, T3FREE, THYROIDAB in the last 72 hours.  Anemia Panel: No results for  input(s): VITAMINB12, FOLATE, FERRITIN, TIBC, IRON , RETICCTPCT in the last 72 hours. Sepsis Labs: No results for input(s): PROCALCITON, LATICACIDVEN in the last 168 hours.  Recent Results (from the past 240 hours)  Resp panel by RT-PCR (RSV, Flu A&B, Covid) Anterior Nasal Swab     Status: None   Collection Time: 09/06/23  8:50 PM   Specimen: Anterior Nasal Swab  Result Value Ref Range Status   SARS Coronavirus 2 by RT PCR NEGATIVE NEGATIVE Final   Influenza A by PCR NEGATIVE NEGATIVE Final   Influenza B by PCR NEGATIVE NEGATIVE Final    Comment: (NOTE) The Xpert Xpress SARS-CoV-2/FLU/RSV plus assay is intended as an aid in the diagnosis of influenza from Nasopharyngeal swab specimens and should not be used as a sole basis for treatment. Nasal washings and aspirates are unacceptable for Xpert Xpress SARS-CoV-2/FLU/RSV testing.  Fact Sheet for Patients: BloggerCourse.com  Fact Sheet for Healthcare Providers: SeriousBroker.it  This test is not yet approved or cleared by the [place]  FDA and has been authorized for detection and/or diagnosis of SARS-CoV-2 by FDA under an Emergency Use Authorization (EUA). This EUA will remain in effect (meaning this test can be used) for the duration of the COVID-19 declaration under Section 564(b)(1) of the Act, 21 U.S.C. section 360bbb-3(b)(1), unless the authorization is terminated or revoked.     Resp Syncytial Virus by PCR NEGATIVE NEGATIVE Final    Comment: (NOTE) Fact Sheet for Patients: BloggerCourse.com  Fact Sheet for Healthcare Providers: SeriousBroker.it  This test is not yet approved or cleared by the [place]  FDA and has been authorized for detection and/or diagnosis of SARS-CoV-2 by FDA under an Emergency Use Authorization (EUA). This EUA will remain in effect (meaning this test can be used) for the  duration of the COVID-19 declaration under Section 564(b)(1) of the Act, 21 U.S.C. section 360bbb-3(b)(1), unless the authorization is terminated or revoked.  Performed at [facility], [address], [place], [place] [address]      Radiology Studies: No results found.  Scheduled Meds:  sodium chloride    Intravenous Once   sodium chloride    Intravenous Once   amLODipine   10 mg Oral Daily   atorvastatin   10 mg Oral Daily   budesonide -glycopyrrolate -formoterol   2 puff Inhalation BID   calcitRIOL   0.25 mcg Oral Daily   carvedilol   6.25 mg Oral BID WC   Chlorhexidine  Gluconate Cloth  6  each Topical Q0600   ferrous sulfate   325 mg Oral QODAY   folic acid   1 mg Oral Daily   gabapentin   100 mg Oral TID   multivitamin with minerals  1 tablet Oral Daily   nicotine   21 mg Transdermal Daily   pantoprazole   40 mg Oral BID   sertraline   25 mg Oral Daily   sodium bicarbonate   1,300 mg Oral BID   sodium chloride  flush  3 mL Intravenous Q12H   sucralfate   1 g Oral QID   thiamine   100 mg Oral Daily   torsemide   100 mg Oral Daily   Continuous Infusions:      LOS: 4 days    Time spent: 50 mins    Darcel Dawley, MD [facility]   If 7PM-7AM, please contact night-coverage

## 2023-09-10 NOTE — Progress Notes (Signed)
 Pt arrived back to 6 north room 5 from HD alert and oriented x4. Bed in lowest position. Call light in reach.

## 2023-09-10 NOTE — Progress Notes (Signed)
   09/10/23 1803  Vitals  Temp 98.2 F (36.8 C)  Pulse Rate 71  Resp 17  BP (!) 167/72  SpO2 98 %  Weight 57.5 kg  Type of Weight Post-Dialysis  Oxygen Therapy  Patient Activity (if Appropriate) In bed  Pulse Oximetry Type Continuous  Post Treatment  Dialyzer Clearance Lightly streaked  Hemodialysis Intake (mL) 0 mL  Liters Processed 38  Fluid Removed (mL) 1000 mL  Tolerated HD Treatment Yes  AVG/AVF Arterial Site Held (minutes) 10 minutes  AVG/AVF Venous Site Held (minutes) 10 minutes   Received patient in bed to unit.  Alert and oriented.  Informed consent signed and in chart.   TX duration:3HRS  Patient tolerated well.  Transported back to the room  Alert, without acute distress.  Hand-off given to patient's nurse.   Access used: RAVF Access issues: NONE  Total UF removed: 1L Medication(s) given: 1 UNIT PRBCS    Na'Shaminy T [facility]

## 2023-09-10 NOTE — Progress Notes (Signed)
 [facility] Progress Note   Subjective: Seen in room. Feels like wheezing, says needs breathing treatment. Dialysis today.   Objective Vitals:   09/10/23 0509 09/10/23 0724 09/10/23 0751 09/10/23 0846  BP: 133/68  (!) 153/71 (!) 153/71  Pulse: 77  74   Resp: 18  18   Temp: 98 F (36.7 C)  98.4 F (36.9 C)   TempSrc: Oral  Oral   SpO2: 92%  97%   Weight:  53.3 kg    Height:        Additional Objective Labs: Basic Metabolic Panel: Recent Labs  Lab 09/08/23 0644 09/09/23 0628 09/10/23 0542  NA 133* 134* 129*  K 3.1* 2.7* 3.5  CL 105 101 101  CO2 11* 21* 18*  GLUCOSE 101* 87 93  BUN 78* 39* 48*  CREATININE 6.18* 3.70* 4.68*  CALCIUM  6.9* 7.1* 7.0*  PHOS 6.6* 3.6 3.7   CBC: Recent Labs  Lab 09/06/23 1543 09/07/23 0923 09/08/23 0644 09/09/23 0628 09/10/23 0542  WBC 6.4 7.2 5.7 5.5 5.9  NEUTROABS 4.6  --   --   --   --   HGB 4.3* 8.9* 7.5* 7.3* 6.5*  HCT 13.7* 26.7* 22.7* 21.6* 19.8*  MCV 108.7* 94.3 94.6 90.4 94.3  PLT 383 380 337 319 308   Blood Culture    Component Value Date/Time   SDES BLOOD LEFT UPPER ARM 06/24/2023 1641   SDES BLOOD SITE NOT SPECIFIED 06/24/2023 1641   SPECREQUEST  06/24/2023 1641    BOTTLES DRAWN AEROBIC AND ANAEROBIC Blood Culture adequate volume   SPECREQUEST  06/24/2023 1641    BOTTLES DRAWN AEROBIC AND ANAEROBIC Blood Culture results may not be optimal due to an inadequate volume of blood received in culture bottles   CULT  06/24/2023 1641    NO GROWTH 5 DAYS Performed at [facility], [address], [place], [place] [address]    CULT  06/24/2023 1641    NO GROWTH 5 DAYS Performed at [facility], [address], [place], [place] [address]    REPTSTATUS 06/29/2023 FINAL 06/24/2023 1641   REPTSTATUS 06/29/2023 FINAL 06/24/2023 1641     Physical Exam General: Chronically ill appearing, nad Heart: RRR Lungs: Clear Abdomen: soft non-tender Extremities: No LE edema  Dialysis Access: L AVF  +T/B   Medications:    sodium chloride    Intravenous Once   sodium chloride    Intravenous Once   amLODipine   10 mg Oral Daily   atorvastatin   10 mg Oral Daily   budesonide -glycopyrrolate -formoterol   2 puff Inhalation BID   calcitRIOL   0.25 mcg Oral Daily   carvedilol   6.25 mg Oral BID WC   Chlorhexidine  Gluconate Cloth  6 each Topical Q0600   ferrous sulfate   325 mg Oral QODAY   folic acid   1 mg Oral Daily   gabapentin   100 mg Oral TID   multivitamin with minerals  1 tablet Oral Daily   nicotine   21 mg Transdermal Daily   pantoprazole   40 mg Oral BID   sertraline   25 mg Oral Daily   sodium bicarbonate   1,300 mg Oral BID   sodium chloride  flush  3 mL Intravenous Q12H   sucralfate   1 g Oral QID   thiamine   100 mg Oral Daily   torsemide   100 mg Oral Daily    Dialysis Orders: No outpatient unit.   Assessment/Plan: ESRD:  Does not have center, refuses regular HD. Does have some residual function. Last HD appears to  be 4/10.  Since has been > 2months, will start with short time and low BFR. Had HD Monday night. Will try for another HD Wed. Optimize while she is inpatient. Not sure there is much else to be done. She is experiencing homelessness and does not have transportation so can not be placed in an outpatient dialysis clinic.  Hypokalemia. S/p Kcl. Follow trends.  Dyspnea: Pulm edema/effusions on CXR and with COPD/rhonchi as well. Volume removal with HD as tolerated.  Acute blood loss anemia.  Hgb 4.3 on admit.  Hx of GIB. S/p 3 units PRBCs. Aranesp  100 given 6/22. S/p SBE - multiple non-bleeding AVMs found. Treated with APC Another u prbcs ordered today. Hgb 6.5  Hypertension/volume: BP ok, meds resumed. No gross volume overload Metabolic bone disease: CorrCa ok, Phos 6.6.  Nutrition: Alb low, adding supps while she is here. Severe leg pain/weakness: Possibly related to the above, she is high risk for PAD. COPD Breztri  ordered. Per primary   Maisie Ronnald Acosta PA-C [facility] 09/10/2023,12:46 PM

## 2023-09-10 NOTE — Plan of Care (Signed)
  Problem: Clinical Measurements: Goal: Will remain free from infection Outcome: Progressing   Problem: Clinical Measurements: Goal: Respiratory complications will improve Outcome: Progressing   Problem: Clinical Measurements: Goal: Cardiovascular complication will be avoided Outcome: Progressing   Problem: Nutrition: Goal: Adequate nutrition will be maintained Outcome: Progressing   Problem: Pain Managment: Goal: General experience of comfort will improve and/or be controlled Outcome: Progressing   Problem: Safety: Goal: Ability to remain free from injury will improve Outcome: Progressing

## 2023-09-10 NOTE — Progress Notes (Signed)
 The patient refused her assessment and did not want this reporter to check her pulses,or listen to her.Patient was irritated about checking  to see if she has a tele monitor on

## 2023-09-10 NOTE — Progress Notes (Signed)
 Blood consent signed and in chart. Blood release form also place in chart with blood bank sticker already applied to it.

## 2023-09-10 NOTE — Progress Notes (Signed)
 Date and time results received: 09/10/23 0719 (use smartphrase .now to insert current time)  Test: HGB Critical Value: 6.5  Name of Provider Notified: Pardeep Khatri,MD @0721   Orders Received? Or Actions Taken?: waiting for new orders

## 2023-09-11 ENCOUNTER — Encounter (HOSPITAL_COMMUNITY): Payer: Self-pay | Admitting: Internal Medicine

## 2023-09-11 LAB — BASIC METABOLIC PANEL WITH GFR
Anion gap: 13 (ref 5–15)
BUN: 31 mg/dL — ABNORMAL HIGH (ref 8–23)
CO2: 20 mmol/L — ABNORMAL LOW (ref 22–32)
Calcium: 7.3 mg/dL — ABNORMAL LOW (ref 8.9–10.3)
Chloride: 97 mmol/L — ABNORMAL LOW (ref 98–111)
Creatinine, Ser: 3.6 mg/dL — ABNORMAL HIGH (ref 0.44–1.00)
GFR, Estimated: 14 mL/min — ABNORMAL LOW (ref >60)
Glucose, Bld: 97 mg/dL (ref 70–99)
Potassium: 3.5 mmol/L (ref 3.5–5.1)
Sodium: 130 mmol/L — ABNORMAL LOW (ref 135–145)

## 2023-09-11 LAB — TYPE AND SCREEN
ABO/RH(D): A POS
Antibody Screen: NEGATIVE
Unit division: 0

## 2023-09-11 LAB — CBC
HCT: 25.5 % — ABNORMAL LOW (ref 36.0–46.0)
Hemoglobin: 8.5 g/dL — ABNORMAL LOW (ref 12.0–15.0)
MCH: 30.5 pg (ref 26.0–34.0)
MCHC: 33.3 g/dL (ref 30.0–36.0)
MCV: 91.4 fL (ref 80.0–100.0)
Platelets: 288 10*3/uL (ref 150–400)
RBC: 2.79 MIL/uL — ABNORMAL LOW (ref 3.87–5.11)
RDW: 21.2 % — ABNORMAL HIGH (ref 11.5–15.5)
WBC: 5.7 10*3/uL (ref 4.0–10.5)
nRBC: 1.2 % — ABNORMAL HIGH (ref 0.0–0.2)

## 2023-09-11 LAB — BPAM RBC
Blood Product Expiration Date: 202507202359
ISSUE DATE / TIME: 202506251445
Unit Type and Rh: 6200

## 2023-09-11 LAB — MAGNESIUM: Magnesium: 1.7 mg/dL (ref 1.7–2.4)

## 2023-09-11 LAB — PHOSPHORUS: Phosphorus: 3.9 mg/dL (ref 2.5–4.6)

## 2023-09-11 NOTE — TOC Progression Note (Signed)
 Transition of Care (TOC) - Progression Note   See prior note.   Patient asked to speak to NCM .   NCM went to bedside.   She has all the addresses and phone numbers . She wants to know when she is discharging so she can call shelters etc and let them know.NCM  told her NCM  would ask MD . She gets her check July 3. She said it's not enough to put a deposit down and rent and live the rest of the month.  She has spoken to someone with a curch and has contact information and was told to call for assistance.   She became very upset with me and said that as a CM/SW I should be able to assist with expenses and know when she is discharging   Secure chatted team  Patient Details  Name: [name] MRN: [id number] Date of Birth: 01-Mar-1961  Transition of Care [facility]) CM/SW Contact  Anetha Slagel, Powell Jansky, RN Phone Number: 09/11/2023, 12:38 PM  Clinical Narrative:       Expected Discharge Plan: Homeless Shelter Barriers to Discharge: Continued Medical Work up  Expected Discharge Plan and Services In-house Referral: Artist Discharge Planning Services: CM Consult Post Acute Care Choice: NA Living arrangements for the past 2 months: Homeless                 DME Arranged: N/A DME Agency: NA       HH Arranged: NA HH Agency: NA         Social Determinants of Health (SDOH) Interventions SDOH Screenings   Food Insecurity: Food Insecurity Present (09/06/2023)  Housing: High Risk (09/09/2023)  Transportation Needs: Unmet Transportation Needs (09/06/2023)  Utilities: Not At Risk (09/06/2023)  Recent Concern: Utilities - At Risk (07/25/2023)  Alcohol  Screen: Low Risk  (02/10/2023)  Depression (PHQ2-9): Medium Risk (06/11/2023)  Financial Resource Strain: High Risk (02/10/2023)  Physical Activity: Inactive (02/10/2023)  Social Connections: Socially Isolated (06/12/2023)  Tobacco Use: High Risk (09/09/2023)  Health Literacy: Adequate Health Literacy (02/10/2023)    Readmission  Risk Interventions    07/01/2023   12:39 PM 06/16/2023    4:04 PM 01/28/2023   10:32 AM  Readmission Risk Prevention Plan  Transportation Screening Complete Complete Complete  Medication Review (RN Care Manager) Referral to Pharmacy Complete Complete  PCP or Specialist appointment within 3-5 days of discharge Complete Complete   HRI or Home Care Consult Complete Complete   SW Recovery Care/Counseling Consult Complete Complete   Palliative Care Screening Not Applicable Not Applicable   Skilled Nursing Facility Not Applicable Not Applicable

## 2023-09-11 NOTE — Plan of Care (Signed)
  Problem: Elimination: Goal: Will not experience complications related to bowel motility Outcome: Progressing   Problem: Coping: Goal: Level of anxiety will decrease Outcome: Progressing   Problem: Nutrition: Goal: Adequate nutrition will be maintained Outcome: Progressing   Problem: Activity: Goal: Risk for activity intolerance will decrease Outcome: Progressing

## 2023-09-11 NOTE — Progress Notes (Signed)
 PROGRESS NOTE    [name]  FMW:[id number] DOB: 1960/05/30 DOA: 09/06/2023 PCP: Vicci Barnie NOVAK, MD   Brief Narrative:  This 63 yrs. old female with PMH significant for tobacco use, polysubstance use, End-stage renal disease not on dialysis due to homelessness, history of chronic GI bleeding due to AVMs, GERD, PUD, COPD, HTN, Chronic leg pain who presents to the ED stating it is time for her to get dialysis.  She reports needing dialysis but unable to get this done due to homelessness.  She has chronic anemia with a baseline hemoglobin of 8-10.  In the ED she was noted to have a hemoglobin of 4.3.  Her creatinine was 6.4, her potassium was 3.4.  The patient had a chest x-ray that showed a right pleural effusion and findings consistent with CHF.  Patient does have a history of congestive heart failure and is on beta-blockers plus diuretics for this.  Patient was given three units PRBC . Nephrology and Gastroenterology was consulted. She was admitted for further evaluation.  Assessment & Plan:   Principal Problem:   Acute blood loss anemia Active Problems:   Chronic diastolic CHF (congestive heart failure) (HCC)   Acute on chronic anemia   ESRD on dialysis [facility])   Essential hypertension   COPD (chronic obstructive pulmonary disease) (HCC)   Anxiety and depression   GERD (gastroesophageal reflux disease)   Hyperlipidemia   Gastric AVM   Tobacco abuse   Chronic leg pain   Polysubstance abuse (HCC)   Angiodysplasia of upper gastrointestinal tract  Acute blood loss anemia: She is found to have hemoglobin 4.3 on arrival in ED. No acute visible bleeding noted. Transfused 3 units PRBC. Hb improved to 8.9. Follow-up H&H again dropped to 6.5 from 7.3 Transfuse 1 unit PRBC,  Monitor H/H Hb 8.5 today.   Acute on chronic anemia: Patient with end-stage renal disease and chronic anemia. Baseline hemoglobin is between 8 and 10. Patient has Kock history of upper GI bleeding due to GI  AVMs and gastritis. Patient gives no reports of acute GI bleeding, denies hematemesis, melena however she does take iron  daily and her stools are dark. Patient's hemoglobin is acutely low at 4.3. S/p 3 units PRBC. Hb improved to 8.9 GI consulted status post EGD found to have 4 non bleeding AVMs in the duodenum and 2 in the jejunum treated with APC (cautery). GI recommended iron  supplementation and GI signed off.   Chronic diastolic CHF (congestive heart failure) (HCC): Continue blood pressure control. Continue Demadex  daily. Currently with right pleural effusion and findings of CHF.   She will get significant volume with blood transfusion and will need some increased diuresis. Continue Lasix  as needed.  ESRD on dialysis [facility]): Patient does have end-stage renal disease however due to her homelessness, She is unable to get chronic dialysis.   She continues to make urine.  Difficult  situation for outpatient hemodialysis set up. Nephrology has been consulted, Patient states now its  time to get hemodialysis. The patient reports getting dialyzed occasionally when she feels like she needs it.   Her BUN is up to 81 but otherwise has no other acute need for dialysis at this time. Nephrology consulted.  Patient underwent hemodialysis on Mon and Wed.. Continue with dialysis per nephrology.   Essential hypertension: Continue amlodipine , carvedilol .   COPD (chronic obstructive pulmonary disease) (HCC) Continue inhalers.   Anxiety and depression: Continue sertraline , trazodone .   Gastric AVM: Has had this in the past and needed APC.  Continue outpatient follow-up.   Hyperlipidemia: Low-cholesterol diet.   GERD (gastroesophageal reflux disease) Continue PPI, sucralfate .   Tobacco abuse: Patient reports cutting down on smoking.   She was offered and accepted nicotine  patch.   Polysubstance abuse [facility]) Patient continues to use cocaine sporadically.   She reports last use was  approximately 4 to 5 days ago.   She denies any IV drug use. Social worker consult.    DVT prophylaxis:SCDs Code Status: DNR Family Communication: No family at bed side. Disposition Plan:    Status is: Inpatient Remains inpatient appropriate because: Admitted for significant anemia and AKI requiring hemodialysis.   Nephrology and gastroenterology is consulted   Consultants:  Nephrology Gastroenterology  Procedures: None  Antimicrobials:  Anti-infectives (From admission, onward)    None      Subjective: Patient was seen and examined at bedside. Overnight events noted. Patient reports feeling better,  She states she does not need hemodialysis anymore.  Denies any further visible bleeding.   Objective: Vitals:   09/10/23 2021 09/11/23 0300 09/11/23 0820 09/11/23 0822  BP: (!) 161/69 (!) 143/76 (!) 135/57   Pulse: 76 72 69   Resp: 18 18 17    Temp: (!) 97.4 F (36.3 C) (!) 97.5 F (36.4 C) (!) 97.5 F (36.4 C)   TempSrc: Oral Oral Oral   SpO2: 96% 97% 99% 100%  Weight:      Height:        Intake/Output Summary (Last 24 hours) at 09/11/2023 1354 Last data filed at 09/10/2023 1803 Gross per 24 hour  Intake 319 ml  Output 1000 ml  Net -681 ml   Filed Weights   09/10/23 0724 09/10/23 1321 09/10/23 1803  Weight: 53.3 kg 58.7 kg 57.5 kg    Examination:  General exam: Appears calm and comfortable, deconditioned, not in any acute distress. Respiratory system: CTA Blaterally. Respiratory effort normal.  RR 15 Cardiovascular system: S1 & S2 heard, RRR. No JVD, murmurs, rubs, gallops or clicks. No pedal edema. Gastrointestinal system: Abdomen is non distended, soft and non tender.  Normal bowel sounds heard. Central nervous system: Alert and oriented x 3. No focal neurological deficits. Extremities: No edema, no cyanosis, no clubbing. Skin: No rashes, lesions or ulcers Psychiatry: Judgement and insight appear normal. Mood & affect appropriate.     Data  Reviewed: I have personally reviewed following labs and imaging studies  CBC: Recent Labs  Lab 09/06/23 1543 09/07/23 0923 09/08/23 0644 09/09/23 0628 09/10/23 0542 09/10/23 2029 09/11/23 0646  WBC 6.4 7.2 5.7 5.5 5.9  --  5.7  NEUTROABS 4.6  --   --   --   --   --   --   HGB 4.3* 8.9* 7.5* 7.3* 6.5* 9.3* 8.5*  HCT 13.7* 26.7* 22.7* 21.6* 19.8* 28.0* 25.5*  MCV 108.7* 94.3 94.6 90.4 94.3  --  91.4  PLT 383 380 337 319 308  --  288   Basic Metabolic Panel: Recent Labs  Lab 09/07/23 0923 09/08/23 0644 09/09/23 0628 09/10/23 0542 09/11/23 0646  NA 134* 133* 134* 129* 130*  K 3.6 3.1* 2.7* 3.5 3.5  CL 104 105 101 101 97*  CO2 13* 11* 21* 18* 20*  GLUCOSE 95 101* 87 93 97  BUN 78* 78* 39* 48* 31*  CREATININE 6.15* 6.18* 3.70* 4.68* 3.60*  CALCIUM  6.9* 6.9* 7.1* 7.0* 7.3*  MG  --  1.7 1.7  --  1.7  PHOS  --  6.6* 3.6 3.7 3.9   GFR: Estimated  Creatinine Clearance: 14.5 mL/min (A) (by C-G formula based on SCr of 3.6 mg/dL (H)). Liver Function Tests: Recent Labs  Lab 09/06/23 1543 09/07/23 0923 09/10/23 0542  AST 17 15  --   ALT 10 9  --   ALKPHOS 72 81  --   BILITOT 0.5 0.5  --   PROT 6.1* 6.1*  --   ALBUMIN  2.5* 2.6* 2.1*   No results for input(s): LIPASE, AMYLASE in the last 168 hours. No results for input(s): AMMONIA in the last 168 hours. Coagulation Profile: No results for input(s): INR, PROTIME in the last 168 hours. Cardiac Enzymes: No results for input(s): CKTOTAL, CKMB, CKMBINDEX, TROPONINI in the last 168 hours. BNP (last 3 results) No results for input(s): PROBNP in the last 8760 hours. HbA1C: No results for input(s): HGBA1C in the last 72 hours. CBG: No results for input(s): GLUCAP in the last 168 hours. Lipid Profile: No results for input(s): CHOL, HDL, LDLCALC, TRIG, CHOLHDL, LDLDIRECT in the last 72 hours. Thyroid Function Tests: No results for input(s): TSH, T4TOTAL, FREET4, T3FREE, THYROIDAB in  the last 72 hours.  Anemia Panel: No results for input(s): VITAMINB12, FOLATE, FERRITIN, TIBC, IRON , RETICCTPCT in the last 72 hours. Sepsis Labs: No results for input(s): PROCALCITON, LATICACIDVEN in the last 168 hours.  Recent Results (from the past 240 hours)  Resp panel by RT-PCR (RSV, Flu A&B, Covid) Anterior Nasal Swab     Status: None   Collection Time: 09/06/23  8:50 PM   Specimen: Anterior Nasal Swab  Result Value Ref Range Status   SARS Coronavirus 2 by RT PCR NEGATIVE NEGATIVE Final   Influenza A by PCR NEGATIVE NEGATIVE Final   Influenza B by PCR NEGATIVE NEGATIVE Final    Comment: (NOTE) The Xpert Xpress SARS-CoV-2/FLU/RSV plus assay is intended as an aid in the diagnosis of influenza from Nasopharyngeal swab specimens and should not be used as a sole basis for treatment. Nasal washings and aspirates are unacceptable for Xpert Xpress SARS-CoV-2/FLU/RSV testing.  Fact Sheet for Patients: BloggerCourse.com  Fact Sheet for Healthcare Providers: SeriousBroker.it  This test is not yet approved or cleared by the [place]  FDA and has been authorized for detection and/or diagnosis of SARS-CoV-2 by FDA under an Emergency Use Authorization (EUA). This EUA will remain in effect (meaning this test can be used) for the duration of the COVID-19 declaration under Section 564(b)(1) of the Act, 21 U.S.C. section 360bbb-3(b)(1), unless the authorization is terminated or revoked.     Resp Syncytial Virus by PCR NEGATIVE NEGATIVE Final    Comment: (NOTE) Fact Sheet for Patients: BloggerCourse.com  Fact Sheet for Healthcare Providers: SeriousBroker.it  This test is not yet approved or cleared by the [place]  FDA and has been authorized for detection and/or diagnosis of SARS-CoV-2 by FDA under an Emergency Use Authorization (EUA). This EUA will remain in  effect (meaning this test can be used) for the duration of the COVID-19 declaration under Section 564(b)(1) of the Act, 21 U.S.C. section 360bbb-3(b)(1), unless the authorization is terminated or revoked.  Performed at [facility], [address], [place], [place] [address]      Radiology Studies: No results found.  Scheduled Meds:  sodium chloride    Intravenous Once   sodium chloride    Intravenous Once   amLODipine   10 mg Oral Daily   atorvastatin   10 mg Oral Daily   budesonide -glycopyrrolate -formoterol   2 puff Inhalation BID   calcitRIOL   0.25 mcg Oral Daily   carvedilol   6.25 mg Oral BID WC   Chlorhexidine  Gluconate Cloth  6 each Topical Q0600   ferrous sulfate   325 mg Oral QODAY   folic acid   1 mg Oral Daily   gabapentin   100 mg Oral TID   multivitamin with minerals  1 tablet Oral Daily   nicotine   21 mg Transdermal Daily   pantoprazole   40 mg Oral BID   sertraline   25 mg Oral Daily   sodium bicarbonate   1,300 mg Oral BID   sodium chloride  flush  3 mL Intravenous Q12H   sucralfate   1 g Oral QID   thiamine   100 mg Oral Daily   torsemide   100 mg Oral Daily   Continuous Infusions:      LOS: 5 days    Time spent: 35 mins    Darcel Dawley, MD [facility]   If 7PM-7AM, please contact night-coverage

## 2023-09-11 NOTE — Progress Notes (Signed)
 [facility] Progress Note   Subjective: Seen in room. Had another dialysis yesterday. Says she doesn't think she needs anymore dialysis in the hospital.    Objective Vitals:   09/10/23 2021 09/11/23 0300 09/11/23 0820 09/11/23 0822  BP: (!) 161/69 (!) 143/76 (!) 135/57   Pulse: 76 72 69   Resp: 18 18 17    Temp: (!) 97.4 F (36.3 C) (!) 97.5 F (36.4 C) (!) 97.5 F (36.4 C)   TempSrc: Oral Oral Oral   SpO2: 96% 97% 99% 100%  Weight:      Height:        Additional Objective Labs: Basic Metabolic Panel: Recent Labs  Lab 09/09/23 0628 09/10/23 0542 09/11/23 0646  NA 134* 129* 130*  K 2.7* 3.5 3.5  CL 101 101 97*  CO2 21* 18* 20*  GLUCOSE 87 93 97  BUN 39* 48* 31*  CREATININE 3.70* 4.68* 3.60*  CALCIUM  7.1* 7.0* 7.3*  PHOS 3.6 3.7 3.9   CBC: Recent Labs  Lab 09/06/23 1543 09/07/23 0923 09/08/23 0644 09/09/23 0628 09/10/23 0542 09/10/23 2029 09/11/23 0646  WBC 6.4 7.2 5.7 5.5 5.9  --  5.7  NEUTROABS 4.6  --   --   --   --   --   --   HGB 4.3* 8.9* 7.5* 7.3* 6.5* 9.3* 8.5*  HCT 13.7* 26.7* 22.7* 21.6* 19.8* 28.0* 25.5*  MCV 108.7* 94.3 94.6 90.4 94.3  --  91.4  PLT 383 380 337 319 308  --  288   Blood Culture    Component Value Date/Time   SDES BLOOD LEFT UPPER ARM 06/24/2023 1641   SDES BLOOD SITE NOT SPECIFIED 06/24/2023 1641   SPECREQUEST  06/24/2023 1641    BOTTLES DRAWN AEROBIC AND ANAEROBIC Blood Culture adequate volume   SPECREQUEST  06/24/2023 1641    BOTTLES DRAWN AEROBIC AND ANAEROBIC Blood Culture results may not be optimal due to an inadequate volume of blood received in culture bottles   CULT  06/24/2023 1641    NO GROWTH 5 DAYS Performed at [facility], [address], [place], [place] [address]    CULT  06/24/2023 1641    NO GROWTH 5 DAYS Performed at [facility], [address], [place], [place] [address]    REPTSTATUS 06/29/2023 FINAL 06/24/2023 1641   REPTSTATUS 06/29/2023 FINAL 06/24/2023 1641      Physical Exam General: Chronically ill appearing, nad Heart: RRR Lungs: Clear Abdomen: soft non-tender Extremities: No LE edema  Dialysis Access: L AVF +T/B   Medications:   sodium chloride    Intravenous Once   sodium chloride    Intravenous Once   amLODipine   10 mg Oral Daily   atorvastatin   10 mg Oral Daily   budesonide -glycopyrrolate -formoterol   2 puff Inhalation BID   calcitRIOL   0.25 mcg Oral Daily   carvedilol   6.25 mg Oral BID WC   Chlorhexidine  Gluconate Cloth  6 each Topical Q0600   ferrous sulfate   325 mg Oral QODAY   folic acid   1 mg Oral Daily   gabapentin   100 mg Oral TID   multivitamin with minerals  1 tablet Oral Daily   nicotine   21 mg Transdermal Daily   pantoprazole   40 mg Oral BID   sertraline   25 mg Oral Daily   sodium bicarbonate   1,300 mg Oral BID   sodium chloride  flush  3 mL Intravenous Q12H   sucralfate   1 g Oral QID   thiamine   100 mg Oral  Daily   torsemide   100 mg Oral Daily    Dialysis Orders: No outpatient unit.   Assessment/Plan: ESRD:  Does not have center, refuses regular HD. Does have some residual function. Last HD appears to be 4/10.  Since has been > 2months, will start with short time and low BFR. Had HD Monday and Wed here.  Not sure there is much else to be done. She doesn't want to have dialysis again in the hospital. Only wants to have dialysis when she feels she needs it. She is experiencing homelessness and does not have transportation so cannot be placed in an outpatient dialysis clinic.  Hypokalemia. S/p KCl. Follow trends.  Dyspnea: Pulm edema/effusions on CXR and with COPD/rhonchi as well. Volume removal with HD as tolerated.  Acute blood loss anemia.  Hgb 4.3 on admit.  Hx of GIB. S/p 4 units PRBCs. Aranesp  100 given 6/22. S/p SBE - multiple non-bleeding AVMs found. Treated with APC. Hgb 8.5  Hypertension/volume: BP ok, meds resumed. No gross volume overload Metabolic bone disease: CorrCa/Phos in range.  Nutrition: Alb  low, adding supps while she is here. Severe leg pain/weakness: Possibly related to the above, she is high risk for PAD. COPD Breztri  ordered. Per primary   Maisie Ronnald Acosta PA-C [facility] 09/11/2023,11:58 AM

## 2023-09-12 LAB — BASIC METABOLIC PANEL WITH GFR
Anion gap: 11 (ref 5–15)
BUN: 45 mg/dL — ABNORMAL HIGH (ref 8–23)
CO2: 23 mmol/L (ref 22–32)
Calcium: 8 mg/dL — ABNORMAL LOW (ref 8.9–10.3)
Chloride: 91 mmol/L — ABNORMAL LOW (ref 98–111)
Creatinine, Ser: 4.09 mg/dL — ABNORMAL HIGH (ref 0.44–1.00)
GFR, Estimated: 12 mL/min — ABNORMAL LOW (ref >60)
Glucose, Bld: 122 mg/dL — ABNORMAL HIGH (ref 70–99)
Potassium: 4 mmol/L (ref 3.5–5.1)
Sodium: 125 mmol/L — ABNORMAL LOW (ref 135–145)

## 2023-09-12 LAB — CBC
HCT: 26.2 % — ABNORMAL LOW (ref 36.0–46.0)
Hemoglobin: 8.5 g/dL — ABNORMAL LOW (ref 12.0–15.0)
MCH: 30.6 pg (ref 26.0–34.0)
MCHC: 32.4 g/dL (ref 30.0–36.0)
MCV: 94.2 fL (ref 80.0–100.0)
Platelets: 266 10*3/uL (ref 150–400)
RBC: 2.78 MIL/uL — ABNORMAL LOW (ref 3.87–5.11)
RDW: 20.9 % — ABNORMAL HIGH (ref 11.5–15.5)
WBC: 5.6 10*3/uL (ref 4.0–10.5)
nRBC: 0.5 % — ABNORMAL HIGH (ref 0.0–0.2)

## 2023-09-12 NOTE — Progress Notes (Signed)
 PROGRESS NOTE    [name]  FMW:[id number] DOB: [birth year] DOA: 09/06/2023 PCP: Vicci Barnie NOVAK, MD   Brief Narrative:  This 63 yrs. old female with PMH significant for tobacco use, polysubstance use, End-stage renal disease not on dialysis due to homelessness, history of chronic GI bleeding due to AVMs, GERD, PUD, COPD, HTN, Chronic leg pain who presents to the ED stating it is time for her to get dialysis.  She reports needing dialysis but unable to get this done due to homelessness.  She has chronic anemia with a baseline hemoglobin of 8-10.  In the ED she was noted to have a hemoglobin of 4.3.  Her creatinine was 6.4, her potassium was 3.4.  The patient had a chest x-ray that showed a right pleural effusion and findings consistent with CHF.  Patient does have a history of congestive heart failure and is on beta-blockers plus diuretics for this.  Patient was given three units PRBC . Nephrology and Gastroenterology was consulted. She was admitted for further evaluation.  Assessment & Plan:   Principal Problem:   Acute blood loss anemia Active Problems:   Chronic diastolic CHF (congestive heart failure) (HCC)   Acute on chronic anemia   ESRD on dialysis [facility])   Essential hypertension   COPD (chronic obstructive pulmonary disease) (HCC)   Anxiety and depression   GERD (gastroesophageal reflux disease)   Hyperlipidemia   Gastric AVM   Tobacco abuse   Chronic leg pain   Polysubstance abuse (HCC)   Angiodysplasia of upper gastrointestinal tract  Acute blood loss anemia: She is found to have hemoglobin 4.3 on arrival in ED. No acute visible bleeding noted. Transfused 3 units PRBC. Hb improved to 8.9. Follow-up H&H again dropped to 6.5 from 7.3 Transfuse 1 unit PRBC,  Monitor H/H Hb 8.5 today.   Acute on chronic anemia: Patient with end-stage renal disease and chronic anemia. Baseline hemoglobin is between 8 and 10. Patient has [place] history of upper GI bleeding due to GI  AVMs and gastritis. Patient gives no reports of acute GI bleeding, denies hematemesis, melena however she does take iron  daily and her stools are dark. Patient's hemoglobin is acutely low at 4.3. S/p 3 units PRBC. Hb improved to 8.9 GI consulted status post EGD found to have 4 non bleeding AVMs in the duodenum and 2 in the jejunum treated with APC (cautery). GI recommended iron  supplementation and GI signed off.   Chronic diastolic CHF (congestive heart failure) (HCC): Continue blood pressure control. Continue Demadex  daily. Currently with right pleural effusion and findings of CHF.   She will get significant volume with blood transfusion and will need some increased diuresis. Continue Lasix  as needed.  ESRD on dialysis [facility]): Patient does have end-stage renal disease however due to her homelessness, She is unable to get chronic dialysis.   She continues to make urine.  Difficult  situation for outpatient hemodialysis set up. She is homeless. Nephrology has been consulted, Patient states now its  time to get hemodialysis. The patient reports getting dialyzed occasionally when she feels like she needs it.   Her BUN is up to 81 but otherwise has no other acute need for dialysis at this time. Nephrology consulted.  Patient underwent hemodialysis on Mon and Wed. She doesn't want to have more dialysis unless she needs it. Continue with dialysis per nephrology.   Essential hypertension: Continue amlodipine , carvedilol .   COPD (chronic obstructive pulmonary disease) (HCC) Continue inhalers.   Anxiety and depression: Continue sertraline ,  trazodone .   Gastric AVM: Has had this in the past and needed APC. Continue outpatient follow-up.   Hyperlipidemia: Low-cholesterol diet.   GERD (gastroesophageal reflux disease) Continue PPI, sucralfate .   Tobacco abuse: Patient reports cutting down on smoking.   She was offered and accepted nicotine  patch.   Polysubstance abuse [facility]) Patient  continues to use cocaine sporadically.   She reports last use was approximately 4 to 5 days ago.   She denies any IV drug use. Social worker consult.    DVT prophylaxis:SCDs Code Status: DNR Family Communication: No family at bed side. Disposition Plan:    Status is: Inpatient Remains inpatient appropriate because: Admitted for significant anemia and AKI requiring hemodialysis.   Nephrology and gastroenterology is consulted. Patient is homeless.   Consultants:  Nephrology Gastroenterology  Procedures: None  Antimicrobials:  Anti-infectives (From admission, onward)    None      Subjective: Patient was seen and examined at bedside. Overnight events noted. Patient reports feeling better,  She states she does not need hemodialysis anymore.  Denies any further visible bleeding. She is homeless, states she doesn't know where to go.   Objective: Vitals:   09/11/23 1549 09/11/23 2204 09/12/23 0613 09/12/23 0751  BP: (!) 109/54 138/68 (!) 144/66   Pulse: 64 73 67   Resp: 17 18 18    Temp: 97.8 F (36.6 C) 98.3 F (36.8 C) 97.7 F (36.5 C)   TempSrc: Oral Oral Oral   SpO2: 97% 94% 97% 96%  Weight:      Height:        Intake/Output Summary (Last 24 hours) at 09/12/2023 1350 Last data filed at 09/11/2023 1548 Gross per 24 hour  Intake 120 ml  Output --  Net 120 ml   Filed Weights   09/10/23 0724 09/10/23 1321 09/10/23 1803  Weight: 53.3 kg 58.7 kg 57.5 kg    Examination:  General exam: Appears calm and comfortable, deconditioned, not in any acute distress. Respiratory system: CTA [place]. Respiratory effort normal.  RR 14 Cardiovascular system: S1 & S2 heard, RRR. No JVD, murmurs, rubs, gallops or clicks. No pedal edema. Gastrointestinal system: Abdomen is non distended, soft and non tender.  Normal bowel sounds heard. Central nervous system: Alert and oriented x 3. No focal neurological deficits. Extremities: No edema, no cyanosis, no clubbing. Skin: No  rashes, lesions or ulcers Psychiatry: Judgement and insight appear normal. Mood & affect appropriate.     Data Reviewed: I have personally reviewed following labs and imaging studies  CBC: Recent Labs  Lab 09/06/23 1543 09/07/23 0923 09/08/23 0644 09/09/23 0628 09/10/23 0542 09/10/23 2029 09/11/23 0646 09/12/23 0729  WBC 6.4   < > 5.7 5.5 5.9  --  5.7 5.6  NEUTROABS 4.6  --   --   --   --   --   --   --   HGB 4.3*   < > 7.5* 7.3* 6.5* 9.3* 8.5* 8.5*  HCT 13.7*   < > 22.7* 21.6* 19.8* 28.0* 25.5* 26.2*  MCV 108.7*   < > 94.6 90.4 94.3  --  91.4 94.2  PLT 383   < > 337 319 308  --  288 266   < > = values in this interval not displayed.   Basic Metabolic Panel: Recent Labs  Lab 09/08/23 0644 09/09/23 0628 09/10/23 0542 09/11/23 0646 09/12/23 0729  NA 133* 134* 129* 130* 125*  K 3.1* 2.7* 3.5 3.5 4.0  CL 105 101 101 97* 91*  CO2 11* 21* 18* 20* 23  GLUCOSE 101* 87 93 97 122*  BUN 78* 39* 48* 31* 45*  CREATININE 6.18* 3.70* 4.68* 3.60* 4.09*  CALCIUM  6.9* 7.1* 7.0* 7.3* 8.0*  MG 1.7 1.7  --  1.7  --   PHOS 6.6* 3.6 3.7 3.9  --    GFR: Estimated Creatinine Clearance: 12.8 mL/min (A) (by C-G formula based on SCr of 4.09 mg/dL (H)). Liver Function Tests: Recent Labs  Lab 09/06/23 1543 09/07/23 0923 09/10/23 0542  AST 17 15  --   ALT 10 9  --   ALKPHOS 72 81  --   BILITOT 0.5 0.5  --   PROT 6.1* 6.1*  --   ALBUMIN  2.5* 2.6* 2.1*   No results for input(s): LIPASE, AMYLASE in the last 168 hours. No results for input(s): AMMONIA in the last 168 hours. Coagulation Profile: No results for input(s): INR, PROTIME in the last 168 hours. Cardiac Enzymes: No results for input(s): CKTOTAL, CKMB, CKMBINDEX, TROPONINI in the last 168 hours. BNP (last 3 results) No results for input(s): PROBNP in the last 8760 hours. HbA1C: No results for input(s): HGBA1C in the last 72 hours. CBG: No results for input(s): GLUCAP in the last 168 hours. Lipid  Profile: No results for input(s): CHOL, HDL, LDLCALC, TRIG, CHOLHDL, LDLDIRECT in the last 72 hours. Thyroid Function Tests: No results for input(s): TSH, T4TOTAL, FREET4, T3FREE, THYROIDAB in the last 72 hours.  Anemia Panel: No results for input(s): VITAMINB12, FOLATE, FERRITIN, TIBC, IRON , RETICCTPCT in the last 72 hours. Sepsis Labs: No results for input(s): PROCALCITON, LATICACIDVEN in the last 168 hours.  Recent Results (from the past 240 hours)  Resp panel by RT-PCR (RSV, Flu A&B, Covid) Anterior Nasal Swab     Status: None   Collection Time: 09/06/23  8:50 PM   Specimen: Anterior Nasal Swab  Result Value Ref Range Status   SARS Coronavirus 2 by RT PCR NEGATIVE NEGATIVE Final   Influenza A by PCR NEGATIVE NEGATIVE Final   Influenza B by PCR NEGATIVE NEGATIVE Final    Comment: (NOTE) The Xpert Xpress SARS-CoV-2/FLU/RSV plus assay is intended as an aid in the diagnosis of influenza from Nasopharyngeal swab specimens and should not be used as a sole basis for treatment. Nasal washings and aspirates are unacceptable for Xpert Xpress SARS-CoV-2/FLU/RSV testing.  Fact Sheet for Patients: BloggerCourse.com  Fact Sheet for Healthcare Providers: SeriousBroker.it  This test is not yet approved or cleared by the [place]  FDA and has been authorized for detection and/or diagnosis of SARS-CoV-2 by FDA under an Emergency Use Authorization (EUA). This EUA will remain in effect (meaning this test can be used) for the duration of the COVID-19 declaration under Section 564(b)(1) of the Act, 21 U.S.C. section 360bbb-3(b)(1), unless the authorization is terminated or revoked.     Resp Syncytial Virus by PCR NEGATIVE NEGATIVE Final    Comment: (NOTE) Fact Sheet for Patients: BloggerCourse.com  Fact Sheet for Healthcare  Providers: SeriousBroker.it  This test is not yet approved or cleared by the [place]  FDA and has been authorized for detection and/or diagnosis of SARS-CoV-2 by FDA under an Emergency Use Authorization (EUA). This EUA will remain in effect (meaning this test can be used) for the duration of the COVID-19 declaration under Section 564(b)(1) of the Act, 21 U.S.C. section 360bbb-3(b)(1), unless the authorization is terminated or revoked.  Performed at [facility], [address], [place], [place] [address]      Radiology  Studies: No results found.  Scheduled Meds:  sodium chloride    Intravenous Once   sodium chloride    Intravenous Once   amLODipine   10 mg Oral Daily   atorvastatin   10 mg Oral Daily   budesonide -glycopyrrolate -formoterol   2 puff Inhalation BID   calcitRIOL   0.25 mcg Oral Daily   carvedilol   6.25 mg Oral BID WC   Chlorhexidine  Gluconate Cloth  6 each Topical Q0600   ferrous sulfate   325 mg Oral QODAY   folic acid   1 mg Oral Daily   gabapentin   100 mg Oral TID   multivitamin with minerals  1 tablet Oral Daily   nicotine   21 mg Transdermal Daily   pantoprazole   40 mg Oral BID   sertraline   25 mg Oral Daily   sodium bicarbonate   1,300 mg Oral BID   sodium chloride  flush  3 mL Intravenous Q12H   sucralfate   1 g Oral QID   thiamine   100 mg Oral Daily   torsemide   100 mg Oral Daily   Continuous Infusions:      LOS: 6 days    Time spent: 35 mins    Darcel Dawley, MD [facility]   If 7PM-7AM, please contact night-coverage

## 2023-09-12 NOTE — Progress Notes (Signed)
 [facility] Progress Note   Subjective: Seen in room. Declines to have dialysis today. Denies chest pain, sob.   Objective Vitals:   09/11/23 1549 09/11/23 2204 09/12/23 0613 09/12/23 0751  BP: (!) 109/54 138/68 (!) 144/66   Pulse: 64 73 67   Resp: 17 18 18    Temp: 97.8 F (36.6 C) 98.3 F (36.8 C) 97.7 F (36.5 C)   TempSrc: Oral Oral Oral   SpO2: 97% 94% 97% 96%  Weight:      Height:        Additional Objective Labs: Basic Metabolic Panel: Recent Labs  Lab 09/09/23 0628 09/10/23 0542 09/11/23 0646 09/12/23 0729  NA 134* 129* 130* 125*  K 2.7* 3.5 3.5 4.0  CL 101 101 97* 91*  CO2 21* 18* 20* 23  GLUCOSE 87 93 97 122*  BUN 39* 48* 31* 45*  CREATININE 3.70* 4.68* 3.60* 4.09*  CALCIUM  7.1* 7.0* 7.3* 8.0*  PHOS 3.6 3.7 3.9  --    CBC: Recent Labs  Lab 09/06/23 1543 09/07/23 0923 09/08/23 0644 09/09/23 0628 09/10/23 0542 09/10/23 2029 09/11/23 0646 09/12/23 0729  WBC 6.4   < > 5.7 5.5 5.9  --  5.7 5.6  NEUTROABS 4.6  --   --   --   --   --   --   --   HGB 4.3*   < > 7.5* 7.3* 6.5* 9.3* 8.5* 8.5*  HCT 13.7*   < > 22.7* 21.6* 19.8* 28.0* 25.5* 26.2*  MCV 108.7*   < > 94.6 90.4 94.3  --  91.4 94.2  PLT 383   < > 337 319 308  --  288 266   < > = values in this interval not displayed.   Blood Culture    Component Value Date/Time   SDES BLOOD LEFT UPPER ARM 06/24/2023 1641   SDES BLOOD SITE NOT SPECIFIED 06/24/2023 1641   SPECREQUEST  06/24/2023 1641    BOTTLES DRAWN AEROBIC AND ANAEROBIC Blood Culture adequate volume   SPECREQUEST  06/24/2023 1641    BOTTLES DRAWN AEROBIC AND ANAEROBIC Blood Culture results may not be optimal due to an inadequate volume of blood received in culture bottles   CULT  06/24/2023 1641    NO GROWTH 5 DAYS Performed at [facility], [address] [place], [place] [address]    CULT  06/24/2023 1641    NO GROWTH 5 DAYS Performed at [facility], [address], [place], [place] [address]     REPTSTATUS 06/29/2023 FINAL 06/24/2023 1641   REPTSTATUS 06/29/2023 FINAL 06/24/2023 1641     Physical Exam General: Chronically ill appearing, nad Heart: RRR Lungs: Clear Abdomen: soft non-tender Extremities: No LE edema  Dialysis Access: L AVF +T/B   Medications:   sodium chloride    Intravenous Once   sodium chloride    Intravenous Once   amLODipine   10 mg Oral Daily   atorvastatin   10 mg Oral Daily   budesonide -glycopyrrolate -formoterol   2 puff Inhalation BID   calcitRIOL   0.25 mcg Oral Daily   carvedilol   6.25 mg Oral BID WC   Chlorhexidine  Gluconate Cloth  6 each Topical Q0600   ferrous sulfate   325 mg Oral QODAY   folic acid   1 mg Oral Daily   gabapentin   100 mg Oral TID   multivitamin with minerals  1 tablet Oral Daily   nicotine   21 mg Transdermal Daily   pantoprazole   40 mg Oral BID   sertraline   25 mg Oral Daily   sodium bicarbonate   1,300 mg Oral BID   sodium chloride  flush  3 mL Intravenous Q12H   sucralfate   1 g Oral QID   thiamine   100 mg Oral Daily   torsemide   100 mg Oral Daily    Dialysis Orders: No outpatient unit.   Assessment/Plan: ESRD:  Does not have center, refuses regular HD. Does have some residual function. Last HD appears to be 4/10.  Since has been > 2months, will start with short time and low BFR. Had HD Monday and Wed here.  She doesn't want to have dialysis again in the hospital. Only wants to have dialysis when she feels she needs it. She is experiencing homelessness and does not have transportation so cannot be placed in an outpatient dialysis clinic.  Hypokalemia. S/p KCl. Follow trends.  Dyspnea: Pulm edema/effusions on CXR and with COPD/rhonchi as well. Volume removal with HD as tolerated.  Acute blood loss anemia.  Hgb 4.3 on admit.  Hx of GIB. S/p 4 units PRBCs. Aranesp  100 given 6/22. S/p SBE - multiple non-bleeding AVMs found. Treated with APC. Hgb 8.5  Hypertension/volume: BP ok, meds resumed. No gross volume overload Metabolic bone  disease: CorrCa/Phos in range.  Nutrition: Alb low, adding supps while she is here. Severe leg pain/weakness: Possibly related to the above, she is high risk for PAD. COPD Breztri  ordered. Per primary  Dispo. Per primary.   Maisie Ronnald Acosta PA-C [facility] 09/12/2023,1:57 PM

## 2023-09-12 NOTE — Plan of Care (Signed)
  Problem: Clinical Measurements: Goal: Will remain free from infection Outcome: Progressing   Problem: Skin Integrity: Goal: Risk for impaired skin integrity will decrease Outcome: Progressing   Problem: Safety: Goal: Ability to remain free from injury will improve Outcome: Progressing    Problem: Elimination: Goal: Will not experience complications related to urinary retention Outcome: Progressing   Problem: Coping: Goal: Level of anxiety will decrease Outcome: Progressing   Problem: Nutrition: Goal: Adequate nutrition will be maintained Outcome: Progressing

## 2023-09-13 ENCOUNTER — Inpatient Hospital Stay (HOSPITAL_COMMUNITY)

## 2023-09-13 NOTE — Progress Notes (Signed)
 PROGRESS NOTE    [name]  FMW:[id number] DOB: 05-Mar-1961 DOA: 09/06/2023 PCP: Vicci Barnie NOVAK, MD   Brief Narrative:  This 63 yrs. old female with PMH significant for tobacco use, polysubstance use, End-stage renal disease not on dialysis due to homelessness, history of chronic GI bleeding due to AVMs, GERD, PUD, COPD, HTN, Chronic leg pain who presents to the ED stating it is time for her to get dialysis.  She reports needing dialysis but unable to get this done due to homelessness.  She has chronic anemia with a baseline hemoglobin of 8-10.  In the ED she was noted to have a hemoglobin of 4.3.  Her creatinine was 6.4, her potassium was 3.4.  The patient had a chest x-ray that showed a right pleural effusion and findings consistent with CHF.  Patient does have a history of congestive heart failure and is on beta-blockers plus diuretics for this.  Patient was given three units PRBC . Nephrology and Gastroenterology was consulted. She was admitted for further evaluation.  Assessment & Plan:   Principal Problem:   Acute blood loss anemia Active Problems:   Chronic diastolic CHF (congestive heart failure) (HCC)   Acute on chronic anemia   ESRD on dialysis [facility])   Essential hypertension   COPD (chronic obstructive pulmonary disease) (HCC)   Anxiety and depression   GERD (gastroesophageal reflux disease)   Hyperlipidemia   Gastric AVM   Tobacco abuse   Chronic leg pain   Polysubstance abuse (HCC)   Angiodysplasia of upper gastrointestinal tract  Acute blood loss anemia: She is found to have hemoglobin 4.3 on arrival in ED. No acute visible bleeding noted. Transfused 3 units PRBC. Hb improved to 8.9. Follow-up H&H again dropped to 6.5 from 7.3 Transfuse 1 unit PRBC,  Monitor H/H Hb 8.5 today.   Acute on chronic anemia: Patient with end-stage renal disease and chronic anemia. Baseline hemoglobin is between 8 and 10. Patient has Mcconico history of upper GI bleeding due to GI  AVMs and gastritis. Patient gives no reports of acute GI bleeding, denies hematemesis, melena however she does take iron  daily and her stools are dark. Patient's hemoglobin is acutely low at 4.3. S/p 3 units PRBC. Hb improved to 8.9 GI consulted status post EGD found to have 4 non bleeding AVMs in the duodenum and 2 in the jejunum treated with APC (cautery). GI recommended iron  supplementation and GI signed off.   Chronic diastolic CHF (congestive heart failure) (HCC): Continue blood pressure control. Continue Demadex  daily. Currently with right pleural effusion and findings of CHF.   She will get significant volume with blood transfusion and will need some increased diuresis. Continue Lasix  as needed.  ESRD on dialysis [facility]): Patient does have end-stage renal disease however due to her homelessness, She is unable to get chronic dialysis.   She continues to make urine.  Difficult  situation for outpatient hemodialysis set up. She is homeless. Nephrology has been consulted, Patient states now its  time to get hemodialysis. The patient reports getting dialyzed occasionally when she feels like she needs it.   Her BUN is up to 81 but otherwise has no other acute need for dialysis at this time. Nephrology consulted.  Patient underwent hemodialysis on Mon and Wed. She doesn't want to have more dialysis unless she needs it. Patient is homeless and has no transport to go to HD.    Essential hypertension: Continue amlodipine , carvedilol .   COPD (chronic obstructive pulmonary disease) (HCC) Continue inhalers.  Anxiety and depression: Continue sertraline , trazodone .   Gastric AVM: Has had this in the past and needed APC. Continue outpatient follow-up.   Hyperlipidemia: Low-cholesterol diet.   GERD (gastroesophageal reflux disease) Continue PPI, sucralfate .   Tobacco abuse: Patient reports cutting down on smoking.   She was offered and accepted nicotine  patch.   Polysubstance abuse  [facility]) Patient continues to use cocaine sporadically.   She reports last use was approximately 4 to 5 days ago.   She denies any IV drug use. Social worker consult.  Hyponatremia: Could be due to decreased intake,  Monitor serum sodium.   DVT prophylaxis:SCDs Code Status: DNR Family Communication: No family at bed side. Disposition Plan:    Status is: Inpatient Remains inpatient appropriate because: Admitted for significant anemia and AKI requiring hemodialysis.   Nephrology and gastroenterology is consulted. Patient is homeless.   Consultants:  Nephrology Gastroenterology  Procedures: None  Antimicrobials:  Anti-infectives (From admission, onward)    None      Subjective: Patient was seen and examined at bedside. Overnight events noted. Patient reports feeling better,  She states she does not need hemodialysis anymore.  Denies any further visible bleeding. She is homeless, states she doesn't know where to go.   Objective: Vitals:   09/12/23 1709 09/12/23 2033 09/13/23 0453 09/13/23 0809  BP: 123/70 (!) 127/54 133/64 (!) 136/59  Pulse: 69 70 69 75  Resp: 17 18 17 18   Temp: 98.3 F (36.8 C) 98 F (36.7 C) 98.1 F (36.7 C) 97.7 F (36.5 C)  TempSrc: Oral  Oral   SpO2: 94% 94% (!) 83% 91%  Weight:      Height:        Intake/Output Summary (Last 24 hours) at 09/13/2023 1221 Last data filed at 09/13/2023 0900 Gross per 24 hour  Intake 483 ml  Output --  Net 483 ml   Filed Weights   09/10/23 0724 09/10/23 1321 09/10/23 1803  Weight: 53.3 kg 58.7 kg 57.5 kg    Examination:  General exam: Appears calm and comfortable, deconditioned, not in any acute distress. Respiratory system: CTA Blaterally. Respiratory effort normal.  RR 13 Cardiovascular system: S1 & S2 heard, RRR. No JVD, murmurs, rubs, gallops or clicks.  Gastrointestinal system: Abdomen is non distended, soft and non tender.  Normal bowel sounds heard. Central nervous system: Alert and oriented x  3. No focal neurological deficits. Extremities: No edema, no cyanosis, no clubbing. Skin: No rashes, lesions or ulcers Psychiatry: Judgement and insight appear normal. Mood & affect appropriate.     Data Reviewed: I have personally reviewed following labs and imaging studies  CBC: Recent Labs  Lab 09/06/23 1543 09/07/23 0923 09/08/23 0644 09/09/23 0628 09/10/23 0542 09/10/23 2029 09/11/23 0646 09/12/23 0729  WBC 6.4   < > 5.7 5.5 5.9  --  5.7 5.6  NEUTROABS 4.6  --   --   --   --   --   --   --   HGB 4.3*   < > 7.5* 7.3* 6.5* 9.3* 8.5* 8.5*  HCT 13.7*   < > 22.7* 21.6* 19.8* 28.0* 25.5* 26.2*  MCV 108.7*   < > 94.6 90.4 94.3  --  91.4 94.2  PLT 383   < > 337 319 308  --  288 266   < > = values in this interval not displayed.   Basic Metabolic Panel: Recent Labs  Lab 09/08/23 0644 09/09/23 0628 09/10/23 0542 09/11/23 0646 09/12/23 0729  NA 133* 134*  129* 130* 125*  K 3.1* 2.7* 3.5 3.5 4.0  CL 105 101 101 97* 91*  CO2 11* 21* 18* 20* 23  GLUCOSE 101* 87 93 97 122*  BUN 78* 39* 48* 31* 45*  CREATININE 6.18* 3.70* 4.68* 3.60* 4.09*  CALCIUM  6.9* 7.1* 7.0* 7.3* 8.0*  MG 1.7 1.7  --  1.7  --   PHOS 6.6* 3.6 3.7 3.9  --    GFR: Estimated Creatinine Clearance: 12.8 mL/min (A) (by C-G formula based on SCr of 4.09 mg/dL (H)). Liver Function Tests: Recent Labs  Lab 09/06/23 1543 09/07/23 0923 09/10/23 0542  AST 17 15  --   ALT 10 9  --   ALKPHOS 72 81  --   BILITOT 0.5 0.5  --   PROT 6.1* 6.1*  --   ALBUMIN  2.5* 2.6* 2.1*   No results for input(s): LIPASE, AMYLASE in the last 168 hours. No results for input(s): AMMONIA in the last 168 hours. Coagulation Profile: No results for input(s): INR, PROTIME in the last 168 hours. Cardiac Enzymes: No results for input(s): CKTOTAL, CKMB, CKMBINDEX, TROPONINI in the last 168 hours. BNP (last 3 results) No results for input(s): PROBNP in the last 8760 hours. HbA1C: No results for input(s): HGBA1C  in the last 72 hours. CBG: No results for input(s): GLUCAP in the last 168 hours. Lipid Profile: No results for input(s): CHOL, HDL, LDLCALC, TRIG, CHOLHDL, LDLDIRECT in the last 72 hours. Thyroid Function Tests: No results for input(s): TSH, T4TOTAL, FREET4, T3FREE, THYROIDAB in the last 72 hours.  Anemia Panel: No results for input(s): VITAMINB12, FOLATE, FERRITIN, TIBC, IRON , RETICCTPCT in the last 72 hours. Sepsis Labs: No results for input(s): PROCALCITON, LATICACIDVEN in the last 168 hours.  Recent Results (from the past 240 hours)  Resp panel by RT-PCR (RSV, Flu A&B, Covid) Anterior Nasal Swab     Status: None   Collection Time: 09/06/23  8:50 PM   Specimen: Anterior Nasal Swab  Result Value Ref Range Status   SARS Coronavirus 2 by RT PCR NEGATIVE NEGATIVE Final   Influenza A by PCR NEGATIVE NEGATIVE Final   Influenza B by PCR NEGATIVE NEGATIVE Final    Comment: (NOTE) The Xpert Xpress SARS-CoV-2/FLU/RSV plus assay is intended as an aid in the diagnosis of influenza from Nasopharyngeal swab specimens and should not be used as a sole basis for treatment. Nasal washings and aspirates are unacceptable for Xpert Xpress SARS-CoV-2/FLU/RSV testing.  Fact Sheet for Patients: BloggerCourse.com  Fact Sheet for Healthcare Providers: SeriousBroker.it  This test is not yet approved or cleared by the [place]  FDA and has been authorized for detection and/or diagnosis of SARS-CoV-2 by FDA under an Emergency Use Authorization (EUA). This EUA will remain in effect (meaning this test can be used) for the duration of the COVID-19 declaration under Section 564(b)(1) of the Act, 21 U.S.C. section 360bbb-3(b)(1), unless the authorization is terminated or revoked.     Resp Syncytial Virus by PCR NEGATIVE NEGATIVE Final    Comment: (NOTE) Fact Sheet for  Patients: BloggerCourse.com  Fact Sheet for Healthcare Providers: SeriousBroker.it  This test is not yet approved or cleared by the [place]  FDA and has been authorized for detection and/or diagnosis of SARS-CoV-2 by FDA under an Emergency Use Authorization (EUA). This EUA will remain in effect (meaning this test can be used) for the duration of the COVID-19 declaration under Section 564(b)(1) of the Act, 21 U.S.C. section 360bbb-3(b)(1), unless the authorization is terminated or revoked.  Performed  at [facility], [address], [place], [place] [address]      Radiology Studies: No results found.  Scheduled Meds:  sodium chloride    Intravenous Once   sodium chloride    Intravenous Once   amLODipine   10 mg Oral Daily   atorvastatin   10 mg Oral Daily   budesonide -glycopyrrolate -formoterol   2 puff Inhalation BID   calcitRIOL   0.25 mcg Oral Daily   carvedilol   6.25 mg Oral BID WC   Chlorhexidine  Gluconate Cloth  6 each Topical Q0600   ferrous sulfate   325 mg Oral QODAY   folic acid   1 mg Oral Daily   gabapentin   100 mg Oral TID   multivitamin with minerals  1 tablet Oral Daily   nicotine   21 mg Transdermal Daily   pantoprazole   40 mg Oral BID   sertraline   25 mg Oral Daily   sodium bicarbonate   1,300 mg Oral BID   sodium chloride  flush  3 mL Intravenous Q12H   sucralfate   1 g Oral QID   thiamine   100 mg Oral Daily   torsemide   100 mg Oral Daily   Continuous Infusions:      LOS: 7 days    Time spent: 35 mins    Darcel Dawley, MD [facility]   If 7PM-7AM, please contact night-coverage

## 2023-09-13 NOTE — Progress Notes (Signed)
 [facility] Progress Note   Subjective:    Seen and examined patient at bedside. She reports mild SOB but not in distress. On 2L O2. CXR ordered today. Noted she only wants dialysis when she feels like she needs treatment. Had HD Monday and Wednesday this week. Unfortunately, will not be able to dialyze today d/t high patient census. Next HD either tomorrow or Monday. Tough situation.   Objective Vitals:   09/12/23 1709 09/12/23 2033 09/13/23 0453 09/13/23 0809  BP: 123/70 (!) 127/54 133/64 (!) 136/59  Pulse: 69 70 69 75  Resp: 17 18 17 18   Temp: 98.3 F (36.8 C) 98 F (36.7 C) 98.1 F (36.7 C) 97.7 F (36.5 C)  TempSrc: Oral  Oral   SpO2: 94% 94% (!) 83% 91%  Weight:      Height:       Physical Exam General: Chronically ill appearing, nad Heart: RRR Lungs: (+) crackles mid-upper lobes posteriorly; on 2l O2 Abdomen: soft non-tender Extremities: No LE edema  Dialysis Access: L AVF +T/B   Filed Weights   09/10/23 0724 09/10/23 1321 09/10/23 1803  Weight: 53.3 kg 58.7 kg 57.5 kg    Intake/Output Summary (Last 24 hours) at 09/13/2023 1248 Last data filed at 09/13/2023 0900 Gross per 24 hour  Intake 483 ml  Output --  Net 483 ml    Additional Objective Labs: Basic Metabolic Panel: Recent Labs  Lab 09/09/23 0628 09/10/23 0542 09/11/23 0646 09/12/23 0729  NA 134* 129* 130* 125*  K 2.7* 3.5 3.5 4.0  CL 101 101 97* 91*  CO2 21* 18* 20* 23  GLUCOSE 87 93 97 122*  BUN 39* 48* 31* 45*  CREATININE 3.70* 4.68* 3.60* 4.09*  CALCIUM  7.1* 7.0* 7.3* 8.0*  PHOS 3.6 3.7 3.9  --    Liver Function Tests: Recent Labs  Lab 09/06/23 1543 09/07/23 0923 09/10/23 0542  AST 17 15  --   ALT 10 9  --   ALKPHOS 72 81  --   BILITOT 0.5 0.5  --   PROT 6.1* 6.1*  --   ALBUMIN  2.5* 2.6* 2.1*   No results for input(s): LIPASE, AMYLASE in the last 168 hours. CBC: Recent Labs  Lab 09/06/23 1543 09/07/23 0923 09/08/23 0644 09/09/23 0628 09/10/23 0542  09/10/23 2029 09/11/23 0646 09/12/23 0729  WBC 6.4   < > 5.7 5.5 5.9  --  5.7 5.6  NEUTROABS 4.6  --   --   --   --   --   --   --   HGB 4.3*   < > 7.5* 7.3* 6.5* 9.3* 8.5* 8.5*  HCT 13.7*   < > 22.7* 21.6* 19.8* 28.0* 25.5* 26.2*  MCV 108.7*   < > 94.6 90.4 94.3  --  91.4 94.2  PLT 383   < > 337 319 308  --  288 266   < > = values in this interval not displayed.   Blood Culture    Component Value Date/Time   SDES BLOOD LEFT UPPER ARM 06/24/2023 1641   SDES BLOOD SITE NOT SPECIFIED 06/24/2023 1641   SPECREQUEST  06/24/2023 1641    BOTTLES DRAWN AEROBIC AND ANAEROBIC Blood Culture adequate volume   SPECREQUEST  06/24/2023 1641    BOTTLES DRAWN AEROBIC AND ANAEROBIC Blood Culture results may not be optimal due to an inadequate volume of blood received in culture bottles   CULT  06/24/2023 1641    NO GROWTH 5 DAYS Performed at [facility], [address], [place], [place] [address]    CULT  06/24/2023 1641    NO GROWTH 5 DAYS Performed at [facility], [address] [place], [place] [address]    REPTSTATUS 06/29/2023 FINAL 06/24/2023 1641   REPTSTATUS 06/29/2023 FINAL 06/24/2023 1641    Cardiac Enzymes: No results for input(s): CKTOTAL, CKMB, CKMBINDEX, TROPONINI in the last 168 hours. CBG: No results for input(s): GLUCAP in the last 168 hours. Iron  Studies: No results for input(s): IRON , TIBC, TRANSFERRIN, FERRITIN in the last 72 hours. Lab Results  Component Value Date   INR 1.1 05/16/2023   INR 1.0 05/16/2023   INR 1.1 05/08/2023   Studies/Results: No results found.  Medications:   sodium chloride    Intravenous Once   sodium chloride    Intravenous Once   amLODipine   10 mg Oral Daily   atorvastatin   10 mg Oral Daily   budesonide -glycopyrrolate -formoterol   2 puff Inhalation BID   calcitRIOL   0.25 mcg Oral Daily   carvedilol   6.25 mg Oral BID WC   Chlorhexidine  Gluconate Cloth  6 each Topical Q0600   ferrous sulfate   325 mg Oral  QODAY   folic acid   1 mg Oral Daily   gabapentin   100 mg Oral TID   multivitamin with minerals  1 tablet Oral Daily   nicotine   21 mg Transdermal Daily   pantoprazole   40 mg Oral BID   sertraline   25 mg Oral Daily   sodium bicarbonate   1,300 mg Oral BID   sodium chloride  flush  3 mL Intravenous Q12H   sucralfate   1 g Oral QID   thiamine   100 mg Oral Daily   torsemide   100 mg Oral Daily    Dialysis Orders: No outpatient unit.   Assessment/Plan: ESRD:  Does not have center, refuses regular HD. Does have some residual function. Last HD appears to be 4/10.  Since has been > 2months, will start with short time and low BFR. Had HD Monday and Wed here this week.  She doesn't want to have dialysis again in the hospital. Only wants to have dialysis when she feels she needs it. She is experiencing homelessness and does not have transportation so cannot be placed in an outpatient dialysis clinic. She's c/o mild SOB today but not in distress. CXR ordered. Next HD either Sunday or Monday if she agrees.  Hypokalemia. S/p KCl. Follow trends. K+ now is 4. Dyspnea: Pulm edema/effusions on CXR and with COPD/rhonchi as well. Volume removal with HD as tolerated. CXR ordered this AM-awaiting results. Acute blood loss anemia.  Hgb 4.3 on admit.  Hx of GIB. S/p 4 units PRBCs. Aranesp  100 given 6/22. S/p SBE - multiple non-bleeding AVMs found. Treated with APC. Hgb 8.5  Hypertension/volume: BP ok, meds resumed. No gross volume overload Metabolic bone disease: CorrCa/Phos in range.  Nutrition: Alb low, adding supps while she is here. Severe leg pain/weakness: Possibly related to the above, she is high risk for PAD. COPD Breztri  ordered. Per primary  Dispo. Per primary.   Charmaine Piety, NP [facility] 09/13/2023,12:48 PM  LOS: 7 days

## 2023-09-13 NOTE — Plan of Care (Signed)

## 2023-09-13 NOTE — Plan of Care (Signed)
   Problem: Clinical Measurements: Goal: Respiratory complications will improve Outcome: Progressing   Problem: Pain Managment: Goal: General experience of comfort will improve and/or be controlled Outcome: Progressing   Problem: Safety: Goal: Ability to remain free from injury will improve Outcome: Progressing

## 2023-09-13 NOTE — Progress Notes (Signed)
 Found burned cigarette on floor - patient states it must have fallen out of my purse

## 2023-09-13 NOTE — Plan of Care (Signed)
  Problem: Pain Managment: Goal: General experience of comfort will improve and/or be controlled Outcome: Progressing   Problem: Safety: Goal: Ability to remain free from injury will improve Outcome: Progressing

## 2023-09-14 LAB — CBC
HCT: 26 % — ABNORMAL LOW (ref 36.0–46.0)
Hemoglobin: 8.6 g/dL — ABNORMAL LOW (ref 12.0–15.0)
MCH: 31.6 pg (ref 26.0–34.0)
MCHC: 33.1 g/dL (ref 30.0–36.0)
MCV: 95.6 fL (ref 80.0–100.0)
Platelets: 270 10*3/uL (ref 150–400)
RBC: 2.72 MIL/uL — ABNORMAL LOW (ref 3.87–5.11)
RDW: 19.5 % — ABNORMAL HIGH (ref 11.5–15.5)
WBC: 8.2 10*3/uL (ref 4.0–10.5)
nRBC: 0 % (ref 0.0–0.2)

## 2023-09-14 LAB — BASIC METABOLIC PANEL WITH GFR
Anion gap: 17 — ABNORMAL HIGH (ref 5–15)
BUN: 64 mg/dL — ABNORMAL HIGH (ref 8–23)
CO2: 20 mmol/L — ABNORMAL LOW (ref 22–32)
Calcium: 7.7 mg/dL — ABNORMAL LOW (ref 8.9–10.3)
Chloride: 86 mmol/L — ABNORMAL LOW (ref 98–111)
Creatinine, Ser: 4.95 mg/dL — ABNORMAL HIGH (ref 0.44–1.00)
GFR, Estimated: 9 mL/min — ABNORMAL LOW (ref >60)
Glucose, Bld: 120 mg/dL — ABNORMAL HIGH (ref 70–99)
Potassium: 4 mmol/L (ref 3.5–5.1)
Sodium: 123 mmol/L — ABNORMAL LOW (ref 135–145)

## 2023-09-14 LAB — MAGNESIUM: Magnesium: 1.9 mg/dL (ref 1.7–2.4)

## 2023-09-14 LAB — PHOSPHORUS: Phosphorus: 5.4 mg/dL — ABNORMAL HIGH (ref 2.5–4.6)

## 2023-09-14 MED ORDER — CHLORHEXIDINE GLUCONATE CLOTH 2 % EX PADS
6.0000 | MEDICATED_PAD | Freq: Every day | CUTANEOUS | Status: DC
Start: 2023-09-15 — End: 2023-09-20
  Administered 2023-09-15 – 2023-09-19 (×4): 6 via TOPICAL

## 2023-09-14 NOTE — Plan of Care (Signed)
  Problem: Coping: Goal: Level of anxiety will decrease Outcome: Progressing   Problem: Pain Managment: Goal: General experience of comfort will improve and/or be controlled Outcome: Progressing   Problem: Safety: Goal: Ability to remain free from injury will improve Outcome: Progressing   Problem: Skin Integrity: Goal: Risk for impaired skin integrity will decrease Outcome: Progressing

## 2023-09-14 NOTE — Plan of Care (Signed)
  Problem: Education: Goal: Knowledge of General Education information will improve Description: Including pain rating scale, medication(s)/side effects and non-pharmacologic comfort measures Outcome: Progressing   Problem: Clinical Measurements: Goal: Respiratory complications will improve Outcome: Progressing   Problem: Pain Managment: Goal: General experience of comfort will improve and/or be controlled Outcome: Progressing   Problem: Safety: Goal: Ability to remain free from injury will improve Outcome: Progressing

## 2023-09-14 NOTE — Progress Notes (Signed)
 PROGRESS NOTE    [name]  FMW:[id number] DOB: January 25, 1961 DOA: 09/06/2023 PCP: Vicci Barnie NOVAK, MD   Brief Narrative:  This 63 yrs. old female with PMH significant for tobacco use, polysubstance use, End-stage renal disease not on dialysis due to homelessness, history of chronic GI bleeding due to AVMs, GERD, PUD, COPD, HTN, Chronic leg pain who presents to the ED stating it is time for her to get dialysis.  She reports needing dialysis but unable to get this done due to homelessness.  She has chronic anemia with a baseline hemoglobin of 8-10.  In the ED she was noted to have a hemoglobin of 4.3.  Her creatinine was 6.4, her potassium was 3.4.  The patient had a chest x-ray that showed a right pleural effusion and findings consistent with CHF.  Patient does have a history of congestive heart failure and is on beta-blockers plus diuretics for this.  Patient was given three units PRBC . Nephrology and Gastroenterology was consulted. She was admitted for further evaluation.  Assessment & Plan:   Principal Problem:   Acute blood loss anemia Active Problems:   Chronic diastolic CHF (congestive heart failure) (HCC)   Acute on chronic anemia   ESRD on dialysis [facility])   Essential hypertension   COPD (chronic obstructive pulmonary disease) (HCC)   Anxiety and depression   GERD (gastroesophageal reflux disease)   Hyperlipidemia   Gastric AVM   Tobacco abuse   Chronic leg pain   Polysubstance abuse (HCC)   Angiodysplasia of upper gastrointestinal tract  Acute blood loss anemia: She is found to have hemoglobin 4.3 on arrival in ED. No acute visible bleeding noted. Transfused 3 units PRBC. Hb improved to 8.9. Follow-up H&H again dropped to 6.5 from 7.3 Transfuse 1 unit PRBC,  Monitor H/H Hb  now stable , remains around 8.5   Acute on chronic anemia: Patient with end-stage renal disease and chronic anemia. Baseline hemoglobin is between 8 and 10. Patient has Sobieski history of upper GI  bleeding due to GI AVMs and gastritis. Patient gives no reports of acute GI bleeding, denies hematemesis, melena however she does take iron  daily and her stools are dark. Patient's hemoglobin is acutely low at 4.3. S/p 3 units PRBC. Hb improved to 8.9 GI consulted status post EGD found to have 4 non bleeding AVMs in the duodenum and 2 in the jejunum treated with APC (cautery). GI recommended iron  supplementation and GI signed off.   Chronic diastolic CHF (congestive heart failure) (HCC): Continue blood pressure control. Continue Demadex  daily. Currently with right pleural effusion and findings of CHF.   She will get significant volume with blood transfusion and will need some increased diuresis. Continue Lasix  as needed.  ESRD on dialysis [facility]): Patient does have end-stage renal disease however due to her homelessness, She is unable to get chronic dialysis.   She continues to make urine.  Difficult  situation for outpatient hemodialysis set up. She is homeless. Nephrology has been consulted, Patient states now its  time to get hemodialysis. The patient reports getting dialyzed occasionally when she feels like she needs it.   Her BUN is up to 81 but otherwise has no other acute need for dialysis at this time. Nephrology consulted.  Patient underwent hemodialysis on Mon and Wed. She doesn't want to have more dialysis unless she needs it. Patient is homeless and has no transport to go to HD.  Patient has developed shortness of breath.  Nephrology has scheduled for hemodialysis  tonight.   Essential hypertension: Continue amlodipine , carvedilol .   COPD (chronic obstructive pulmonary disease) (HCC) Continue inhalers.   Anxiety and depression: Continue sertraline , trazodone .   Gastric AVM: Has had this in the past and needed APC. Continue outpatient follow-up.   Hyperlipidemia: Low-cholesterol diet.   GERD (gastroesophageal reflux disease) Continue PPI, sucralfate .   Tobacco  abuse: Patient reports cutting down on smoking.   She was offered and accepted nicotine  patch.   Polysubstance abuse [facility]) Patient continues to use cocaine sporadically.   She reports last use was approximately 4 to 5 days ago.   She denies any IV drug use. Social worker consult.  Hyponatremia: Could be due to decreased intake,  Monitor serum sodium. Nephrology following   DVT prophylaxis:SCDs Code Status: DNR Family Communication: No family at bed side. Disposition Plan:    Status is: Inpatient Remains inpatient appropriate because: Admitted for significant anemia and AKI requiring hemodialysis.   Nephrology and gastroenterology is consulted. Patient is homeless.   Consultants:  Nephrology Gastroenterology  Procedures: None  Antimicrobials:  Anti-infectives (From admission, onward)    None      Subjective: Patient was seen and examined at bedside. Overnight events noted. Patient reports having shortness of breath and wants to have dialysis today. Denies any further visible bleeding. She is homeless, states she doesn't know where to go.   Objective: Vitals:   09/14/23 0539 09/14/23 0814 09/14/23 0827 09/14/23 0934  BP: 124/62 (!) 148/70  (!) 148/70  Pulse: 65 77    Resp: 18 17    Temp: 98.6 F (37 C) 97.8 F (36.6 C)    TempSrc: Oral     SpO2: 95% 92% 92%   Weight:      Height:       No intake or output data in the 24 hours ending 09/14/23 1311  Filed Weights   09/10/23 0724 09/10/23 1321 09/10/23 1803  Weight: 53.3 kg 58.7 kg 57.5 kg    Examination:  General exam: Appears calm and comfortable, deconditioned, not in any acute distress. Respiratory system: CTA Blaterally. Respiratory effort normal.  RR 13 Cardiovascular system: S1 & S2 heard, RRR. No JVD, murmurs, rubs, gallops or clicks.  Gastrointestinal system: Abdomen is non distended, soft and non tender.  Normal bowel sounds heard. Central nervous system: Alert and oriented x 3. No focal  neurological deficits. Extremities: No edema, no cyanosis, no clubbing. Skin: No rashes, lesions or ulcers Psychiatry: Judgement and insight appear normal. Mood & affect appropriate.     Data Reviewed: I have personally reviewed following labs and imaging studies  CBC: Recent Labs  Lab 09/09/23 0628 09/10/23 0542 09/10/23 2029 09/11/23 0646 09/12/23 0729 09/14/23 1044  WBC 5.5 5.9  --  5.7 5.6 8.2  HGB 7.3* 6.5* 9.3* 8.5* 8.5* 8.6*  HCT 21.6* 19.8* 28.0* 25.5* 26.2* 26.0*  MCV 90.4 94.3  --  91.4 94.2 95.6  PLT 319 308  --  288 266 270   Basic Metabolic Panel: Recent Labs  Lab 09/08/23 0644 09/09/23 0628 09/10/23 0542 09/11/23 0646 09/12/23 0729 09/14/23 1044  NA 133* 134* 129* 130* 125* 123*  K 3.1* 2.7* 3.5 3.5 4.0 4.0  CL 105 101 101 97* 91* 86*  CO2 11* 21* 18* 20* 23 20*  GLUCOSE 101* 87 93 97 122* 120*  BUN 78* 39* 48* 31* 45* 64*  CREATININE 6.18* 3.70* 4.68* 3.60* 4.09* 4.95*  CALCIUM  6.9* 7.1* 7.0* 7.3* 8.0* 7.7*  MG 1.7 1.7  --  1.7  --  1.9  PHOS 6.6* 3.6 3.7 3.9  --  5.4*   GFR: Estimated Creatinine Clearance: 10.6 mL/min (A) (by C-G formula based on SCr of 4.95 mg/dL (H)). Liver Function Tests: Recent Labs  Lab 09/10/23 0542  ALBUMIN  2.1*   No results for input(s): LIPASE, AMYLASE in the last 168 hours. No results for input(s): AMMONIA in the last 168 hours. Coagulation Profile: No results for input(s): INR, PROTIME in the last 168 hours. Cardiac Enzymes: No results for input(s): CKTOTAL, CKMB, CKMBINDEX, TROPONINI in the last 168 hours. BNP (last 3 results) No results for input(s): PROBNP in the last 8760 hours. HbA1C: No results for input(s): HGBA1C in the last 72 hours. CBG: No results for input(s): GLUCAP in the last 168 hours. Lipid Profile: No results for input(s): CHOL, HDL, LDLCALC, TRIG, CHOLHDL, LDLDIRECT in the last 72 hours. Thyroid Function Tests: No results for input(s): TSH, T4TOTAL,  FREET4, T3FREE, THYROIDAB in the last 72 hours.  Anemia Panel: No results for input(s): VITAMINB12, FOLATE, FERRITIN, TIBC, IRON , RETICCTPCT in the last 72 hours. Sepsis Labs: No results for input(s): PROCALCITON, LATICACIDVEN in the last 168 hours.  Recent Results (from the past 240 hours)  Resp panel by RT-PCR (RSV, Flu A&B, Covid) Anterior Nasal Swab     Status: None   Collection Time: 09/06/23  8:50 PM   Specimen: Anterior Nasal Swab  Result Value Ref Range Status   SARS Coronavirus 2 by RT PCR NEGATIVE NEGATIVE Final   Influenza A by PCR NEGATIVE NEGATIVE Final   Influenza B by PCR NEGATIVE NEGATIVE Final    Comment: (NOTE) The Xpert Xpress SARS-CoV-2/FLU/RSV plus assay is intended as an aid in the diagnosis of influenza from Nasopharyngeal swab specimens and should not be used as a sole basis for treatment. Nasal washings and aspirates are unacceptable for Xpert Xpress SARS-CoV-2/FLU/RSV testing.  Fact Sheet for Patients: BloggerCourse.com  Fact Sheet for Healthcare Providers: SeriousBroker.it  This test is not yet approved or cleared by the [place]  FDA and has been authorized for detection and/or diagnosis of SARS-CoV-2 by FDA under an Emergency Use Authorization (EUA). This EUA will remain in effect (meaning this test can be used) for the duration of the COVID-19 declaration under Section 564(b)(1) of the Act, 21 U.S.C. section 360bbb-3(b)(1), unless the authorization is terminated or revoked.     Resp Syncytial Virus by PCR NEGATIVE NEGATIVE Final    Comment: (NOTE) Fact Sheet for Patients: BloggerCourse.com  Fact Sheet for Healthcare Providers: SeriousBroker.it  This test is not yet approved or cleared by the [place]  FDA and has been authorized for detection and/or diagnosis of SARS-CoV-2 by FDA under an Emergency Use  Authorization (EUA). This EUA will remain in effect (meaning this test can be used) for the duration of the COVID-19 declaration under Section 564(b)(1) of the Act, 21 U.S.C. section 360bbb-3(b)(1), unless the authorization is terminated or revoked.  Performed at [facility], [address], [place], [place] [address]      Radiology Studies: DG Chest 2 View Result Date: 09/13/2023 CLINICAL DATA:  Shortness of breath.  Chest pain. EXAM: CHEST - 2 VIEW COMPARISON:  09/06/2023 FINDINGS: Right pleural effusion has increased from prior exam, at least moderate in size. Associated right basilar opacity. There is volume loss in the right hemithorax. Small left pleural effusion and basilar opacity, increasing. Mild cardiomegaly. Diffuse peribronchial thickening. No pneumothorax. IMPRESSION: 1. Increasing bilateral pleural effusions, right greater than left. Associated basilar opacities. 2. Mild cardiomegaly. Diffuse peribronchial  thickening. Electronically Signed   By: Andrea Gasman M.D.   On: 09/13/2023 18:12    Scheduled Meds:  sodium chloride    Intravenous Once   sodium chloride    Intravenous Once   amLODipine   10 mg Oral Daily   atorvastatin   10 mg Oral Daily   budesonide -glycopyrrolate -formoterol   2 puff Inhalation BID   calcitRIOL   0.25 mcg Oral Daily   carvedilol   6.25 mg Oral BID WC   [START ON 09/15/2023] Chlorhexidine  Gluconate Cloth  6 each Topical Q0600   ferrous sulfate   325 mg Oral QODAY   folic acid   1 mg Oral Daily   gabapentin   100 mg Oral TID   multivitamin with minerals  1 tablet Oral Daily   nicotine   21 mg Transdermal Daily   pantoprazole   40 mg Oral BID   sertraline   25 mg Oral Daily   sodium bicarbonate   1,300 mg Oral BID   sodium chloride  flush  3 mL Intravenous Q12H   sucralfate   1 g Oral QID   thiamine   100 mg Oral Daily   torsemide   100 mg Oral Daily   Continuous Infusions:      LOS: 8 days    Time spent: 35 mins    Darcel Dawley,  MD [facility]   If 7PM-7AM, please contact night-coverage

## 2023-09-14 NOTE — Progress Notes (Signed)
  [facility] Progress Note   Subjective:    Seen and examined patient at bedside. She reports worsening SOB. Remains on 2L. Recent CXR shows worsened R-pleural effusion. Discussed with Hospitalist. Plan for HD tonight. Plan for IR consult for a thoracentesis.  Objective Vitals:   09/14/23 0539 09/14/23 0814 09/14/23 0827 09/14/23 0934  BP: 124/62 (!) 148/70  (!) 148/70  Pulse: 65 77    Resp: 18 17    Temp: 98.6 F (37 C) 97.8 F (36.6 C)    TempSrc: Oral     SpO2: 95% 92% 92%   Weight:      Height:       Physical Exam General: Chronically ill appearing, nad Heart: RRR Lungs: Breathing is more labored today; (+) crackles mid-upper lobes posteriorly; on 2l O2 Abdomen: soft non-tender Extremities: No LE edema  Dialysis Access: L AVF +T/B   Filed Weights   09/10/23 0724 09/10/23 1321 09/10/23 1803  Weight: 53.3 kg 58.7 kg 57.5 kg    Intake/Output Summary (Last 24 hours) at 09/14/2023 1201 Last data filed at 09/13/2023 1300 Gross per 24 hour  Intake 240 ml  Output --  Net 240 ml    Additional Objective Labs: Basic Metabolic Panel: Recent Labs  Lab 09/09/23 0628 09/10/23 0542 09/11/23 0646 09/12/23 0729  NA 134* 129* 130* 125*  K 2.7* 3.5 3.5 4.0  CL 101 101 97* 91*  CO2 21* 18* 20* 23  GLUCOSE 87 93 97 122*  BUN 39* 48* 31* 45*  CREATININE 3.70* 4.68* 3.60* 4.09*  CALCIUM  7.1* 7.0* 7.3* 8.0*  PHOS 3.6 3.7 3.9  --    Liver Function Tests: Recent Labs  Lab 09/10/23 0542  ALBUMIN  2.1*   No results for input(s): LIPASE, AMYLASE in the last 168 hours. CBC: Recent Labs  Lab 09/09/23 0628 09/10/23 0542 09/10/23 2029 09/11/23 0646 09/12/23 0729 09/14/23 1044  WBC 5.5 5.9  --  5.7 5.6 8.2  HGB 7.3* 6.5*   < > 8.5* 8.5* 8.6*  HCT 21.6* 19.8*   < > 25.5* 26.2* 26.0*  MCV 90.4 94.3  --  91.4 94.2 95.6  PLT 319 308  --  288 266 270   < > = values in this interval not displayed.   Blood Culture    Component Value Date/Time   SDES  BLOOD LEFT UPPER ARM 06/24/2023 1641   SDES BLOOD SITE NOT SPECIFIED 06/24/2023 1641   SPECREQUEST  06/24/2023 1641    BOTTLES DRAWN AEROBIC AND ANAEROBIC Blood Culture adequate volume   SPECREQUEST  06/24/2023 1641    BOTTLES DRAWN AEROBIC AND ANAEROBIC Blood Culture results may not be optimal due to an inadequate volume of blood received in culture bottles   CULT  06/24/2023 1641    NO GROWTH 5 DAYS Performed at [facility], [address], [place], [place] [address]    CULT  06/24/2023 1641    NO GROWTH 5 DAYS Performed at [facility], [address] [place], [place] [address]    REPTSTATUS 06/29/2023 FINAL 06/24/2023 1641   REPTSTATUS 06/29/2023 FINAL 06/24/2023 1641    Cardiac Enzymes: No results for input(s): CKTOTAL, CKMB, CKMBINDEX, TROPONINI in the last 168 hours. CBG: No results for input(s): GLUCAP in the last 168 hours. Iron  Studies: No results for input(s): IRON , TIBC, TRANSFERRIN, FERRITIN in the last 72 hours. Lab Results  Component Value Date   INR 1.1 05/16/2023   INR 1.0 05/16/2023   INR 1.1 05/08/2023  Studies/Results: DG Chest 2 View Result Date: 09/13/2023 CLINICAL DATA:  Shortness of breath.  Chest pain. EXAM: CHEST - 2 VIEW COMPARISON:  09/06/2023 FINDINGS: Right pleural effusion has increased from prior exam, at least moderate in size. Associated right basilar opacity. There is volume loss in the right hemithorax. Small left pleural effusion and basilar opacity, increasing. Mild cardiomegaly. Diffuse peribronchial thickening. No pneumothorax. IMPRESSION: 1. Increasing bilateral pleural effusions, right greater than left. Associated basilar opacities. 2. Mild cardiomegaly. Diffuse peribronchial thickening. Electronically Signed   By: Andrea Gasman M.D.   On: 09/13/2023 18:12    Medications:   sodium chloride    Intravenous Once   sodium chloride    Intravenous Once   amLODipine   10 mg Oral Daily   atorvastatin   10 mg  Oral Daily   budesonide -glycopyrrolate -formoterol   2 puff Inhalation BID   calcitRIOL   0.25 mcg Oral Daily   carvedilol   6.25 mg Oral BID WC   [START ON 09/15/2023] Chlorhexidine  Gluconate Cloth  6 each Topical Q0600   ferrous sulfate   325 mg Oral QODAY   folic acid   1 mg Oral Daily   gabapentin   100 mg Oral TID   multivitamin with minerals  1 tablet Oral Daily   nicotine   21 mg Transdermal Daily   pantoprazole   40 mg Oral BID   sertraline   25 mg Oral Daily   sodium bicarbonate   1,300 mg Oral BID   sodium chloride  flush  3 mL Intravenous Q12H   sucralfate   1 g Oral QID   thiamine   100 mg Oral Daily   torsemide   100 mg Oral Daily    Dialysis Orders: No outpatient unit.   Assessment/Plan: ESRD:  Does not have center, refuses regular HD. Does have some residual function. Last HD appears to be 4/10.  Since has been > 2months, will start with short time and low BFR. Had HD 6/23 and 6/25 here this week.  She doesn't want to have dialysis again in the hospital. Only wants to have dialysis when she feels she needs it. She is experiencing homelessness and does not have transportation so cannot be placed in an outpatient dialysis clinic. Today, she reports worsening SOB. CXR shows worsened R-sided pleural effusion. Plan for HD tonight. Hypokalemia. S/p KCl. Follow trends. K+ now is 4. Dyspnea: Pulm edema/effusions on CXR and with COPD/rhonchi as well. Recent CXR showed worsened R-sided pleural effusion. D/w Hospitalist. IR consulted for thoracentesis. Volume removal with HD as tolerated.  Acute blood loss anemia.  Hgb 4.3 on admit.  Hx of GIB. S/p 4 units PRBCs. Aranesp  100 given 6/22. S/p SBE - multiple non-bleeding AVMs found. Treated with APC. Hgb 8.6  Hypertension/volume: BP ok, meds resumed. No gross volume overload Metabolic bone disease: CorrCa/Phos in range.  Nutrition: Alb low, adding supps while she is here. Severe leg pain/weakness: Possibly related to the above, she is high risk for  PAD. COPD Breztri  ordered. Per primary  Dispo. Per primary.   Charmaine Piety, NP [facility] 09/14/2023,12:01 PM  LOS: 8 days

## 2023-09-15 ENCOUNTER — Inpatient Hospital Stay (HOSPITAL_COMMUNITY)

## 2023-09-15 DIAGNOSIS — N186 End stage renal disease: Secondary | ICD-10-CM

## 2023-09-15 DIAGNOSIS — R079 Chest pain, unspecified: Secondary | ICD-10-CM

## 2023-09-15 DIAGNOSIS — I5032 Chronic diastolic (congestive) heart failure: Secondary | ICD-10-CM

## 2023-09-15 DIAGNOSIS — D649 Anemia, unspecified: Secondary | ICD-10-CM

## 2023-09-15 DIAGNOSIS — Z992 Dependence on renal dialysis: Secondary | ICD-10-CM

## 2023-09-15 HISTORY — PX: IR THORACENTESIS ASP PLEURAL SPACE W/IMG GUIDE: IMG5380

## 2023-09-15 LAB — BASIC METABOLIC PANEL WITH GFR
Anion gap: 16 — ABNORMAL HIGH (ref 5–15)
BUN: 70 mg/dL — ABNORMAL HIGH (ref 8–23)
CO2: 21 mmol/L — ABNORMAL LOW (ref 22–32)
Calcium: 7.6 mg/dL — ABNORMAL LOW (ref 8.9–10.3)
Chloride: 82 mmol/L — ABNORMAL LOW (ref 98–111)
Creatinine, Ser: 5.13 mg/dL — ABNORMAL HIGH (ref 0.44–1.00)
GFR, Estimated: 9 mL/min — ABNORMAL LOW (ref >60)
Glucose, Bld: 119 mg/dL — ABNORMAL HIGH (ref 70–99)
Potassium: 4.4 mmol/L (ref 3.5–5.1)
Sodium: 119 mmol/L — CL (ref 135–145)

## 2023-09-15 LAB — CBC
HCT: 22.1 % — ABNORMAL LOW (ref 36.0–46.0)
Hemoglobin: 7.2 g/dL — ABNORMAL LOW (ref 12.0–15.0)
MCH: 30.8 pg (ref 26.0–34.0)
MCHC: 32.6 g/dL (ref 30.0–36.0)
MCV: 94.4 fL (ref 80.0–100.0)
Platelets: 252 10*3/uL (ref 150–400)
RBC: 2.34 MIL/uL — ABNORMAL LOW (ref 3.87–5.11)
RDW: 18.6 % — ABNORMAL HIGH (ref 11.5–15.5)
WBC: 6.7 10*3/uL (ref 4.0–10.5)
nRBC: 0 % (ref 0.0–0.2)

## 2023-09-15 LAB — MAGNESIUM: Magnesium: 2.1 mg/dL (ref 1.7–2.4)

## 2023-09-15 LAB — PHOSPHORUS: Phosphorus: 6.6 mg/dL — ABNORMAL HIGH (ref 2.5–4.6)

## 2023-09-15 LAB — OSMOLALITY: Osmolality: 274 mosm/kg — ABNORMAL LOW (ref 275–295)

## 2023-09-15 MED ORDER — ALTEPLASE 2 MG IJ SOLR
2.0000 mg | Freq: Once | INTRAMUSCULAR | Status: DC | PRN
Start: 2023-09-15 — End: 2023-09-16

## 2023-09-15 MED ORDER — HEPARIN SODIUM (PORCINE) 1000 UNIT/ML DIALYSIS: 1000.0000 [IU] | INTRAMUSCULAR | Status: DC | PRN

## 2023-09-15 MED ORDER — LIDOCAINE HCL (PF) 1 % IJ SOLN: 5.0000 mL | INTRAMUSCULAR | Status: DC | PRN

## 2023-09-15 MED ORDER — LIDOCAINE-PRILOCAINE 2.5-2.5 % EX CREA: 1.0000 | TOPICAL_CREAM | CUTANEOUS | Status: DC | PRN

## 2023-09-15 MED ORDER — SODIUM BICARBONATE 650 MG PO TABS
650.0000 mg | ORAL_TABLET | Freq: Two times a day (BID) | ORAL | Status: DC
  Administered 2023-09-15 – 2023-09-19 (×8): 650 mg via ORAL
  Filled 2023-09-15 (×8): qty 1

## 2023-09-15 MED ORDER — LIDOCAINE HCL 1 % IJ SOLN
INTRAMUSCULAR | Status: AC
  Filled 2023-09-15: qty 20

## 2023-09-15 MED ORDER — LIDOCAINE HCL 1 % IJ SOLN: 10.0000 mL | Freq: Once | INTRAMUSCULAR | Status: DC

## 2023-09-15 MED ORDER — PENTAFLUOROPROP-TETRAFLUOROETH EX AERO: 1.0000 | INHALATION_SPRAY | CUTANEOUS | Status: DC | PRN

## 2023-09-15 NOTE — Progress Notes (Signed)
 Patient complained of chest tightness and short of breath. Breathing treatment administered.MD made aware.SPO2 was 77 oxygen increased to 4liters/min.Went for Hemodialysis but refused.

## 2023-09-15 NOTE — Plan of Care (Signed)
   Problem: Nutrition: Goal: Adequate nutrition will be maintained Outcome: Progressing   Problem: Coping: Goal: Level of anxiety will decrease Outcome: Progressing

## 2023-09-15 NOTE — Procedures (Signed)
 PROCEDURE SUMMARY:  Successful image-guided right thoracentesis. Yielded 700 mL of  hazy amber fluid. Pt tolerated procedure well. No immediate complications. EBL = trace   Specimen was not sent for labs. CXR ordered.  Please see imaging section of Epic for full dictation.  Dhiya Smits H Niraj Kudrna PA-C 09/15/2023 8:39 AM

## 2023-09-15 NOTE — Progress Notes (Signed)
 [facility]                                                                              Letina Norlander, is a 63 y.o. female, DOB - December 08, 1960, FMW:[id number] Admit date - 09/06/2023    Outpatient Primary MD for the patient is Vicci Barnie NOVAK, MD  LOS - 9  days  Chief Complaint  Patient presents with   Leg Pain   Chest Pain   Shortness of Breath       Brief summary   Patient is 63 yrs. 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  She reported needing dialysis but unable to get this done due to homelessness.  She has chronic anemia with a baseline hemoglobin of 8-10.  In the ED she was noted to have a hemoglobin of 4.3.  Her creatinine was 6.4, her potassium was 3.4.  The patient had a chest x-ray that showed a right pleural effusion and findings consistent with CHF.  Patient does have a history of congestive heart failure and is on beta-blockers plus diuretics for this.  Patient was given three units PRBC . Nephrology and Gastroenterology was consulted. She was admitted for further evaluation.    Assessment & Plan    Acute blood loss anemia, acute on chronic anemia:  GERD, AVMs She is found to have hemoglobin 4.3 on arrival in ED, no acute obvious bleeding.  Transfused 3 units PRBC. Hb improved to 8.9, then subsequently dropped to 6.5. -Baseline hemoglobin is between 8 and 10.  Has history of upper GI bleeding due to GI AVMs, gastritis, also in the setting of ESRD -GI consulted, s/p EGD found to have 4 non bleeding AVMs in the duodenum and 2 in the jejunum treated with APC (cautery). GI recommended iron  supplementation, currently signed off -Continue Protonix , Carafate    Chronic diastolic CHF (congestive heart failure) [facility]): Right pleural effusion Right pleural effusion  and findings of volume overload, CHF, on torsemide  -Volume controlled with dialysis - Status post right-sided thoracentesis today, 700 cc removed   ESRD on dialysis [facility]): -History of ESRD, homelessness, unable to get chronic dialysis due to homelessness and reports getting dialyzed occasionally when she feels like she needs it.   -Nephrology following, also refused dialysis yesterday. - homeless and has no transport to go to HD.   Hyponatremia - Due to volume overload however refusing hemodialysis, currently alert and oriented - Sodium 119 management per nephrology -Will check osmolality, urine osmolality, Una   Essential hypertension: Continue amlodipine , carvedilol .   COPD (chronic obstructive pulmonary disease) (HCC) Continue inhalers.   Anxiety and depression: Continue sertraline , trazodone .    Hyperlipidemia: -Continue Lipitor   Tobacco abuse: Patient reports cutting down on smoking.   Continue nicotine  patch  Polysubstance abuse (HCC) Patient continues to use cocaine sporadically.   She reports last use was approximately 4 to 5 days ago.   She denies any IV drug use. Social worker  consult.  Estimated body mass index is 19.85 kg/m as calculated from the following:   Height as of this encounter: 5' 7 (1.702 m).   Weight as of this encounter: 57.5 kg.  Code Status: DNR, DNI discussed with the patient DVT Prophylaxis:  SCDs Start: 09/06/23 2047   Level of Care: Level of care: Telemetry Medical Family Communication: Updated patient Disposition Plan:      Remains inpatient appropriate:   TBD   Procedures:    Consultants:   Urology, GI  Antimicrobials:   Anti-infectives (From admission, onward)    None          Medications  sodium chloride    Intravenous Once   sodium chloride    Intravenous Once   amLODipine   10 mg Oral Daily   atorvastatin   10 mg Oral Daily   budesonide -glycopyrrolate -formoterol   2 puff Inhalation BID   calcitRIOL   0.25 mcg  Oral Daily   carvedilol   6.25 mg Oral BID WC   Chlorhexidine  Gluconate Cloth  6 each Topical Q0600   ferrous sulfate   325 mg Oral QODAY   folic acid   1 mg Oral Daily   gabapentin   100 mg Oral TID   lidocaine   10 mL Intradermal Once   multivitamin with minerals  1 tablet Oral Daily   nicotine   21 mg Transdermal Daily   pantoprazole   40 mg Oral BID   sertraline   25 mg Oral Daily   sodium bicarbonate   650 mg Oral BID   sodium chloride  flush  3 mL Intravenous Q12H   sucralfate   1 g Oral QID   thiamine   100 mg Oral Daily   torsemide   100 mg Oral Daily      Subjective:   Aunya Vezina was seen and examined today.  Somewhat irritable, refused to dialysis last night.  Patient denies dizziness, chest pain, shortness of breath, abdominal pain, N/V.   Objective:   Vitals:   09/15/23 0750 09/15/23 0825 09/15/23 0836 09/15/23 1102  BP: 132/60 (!) 130/55 (!) 122/53 (!) 122/53  Pulse: 75     Resp: 18     Temp: 97.9 F (36.6 C)     TempSrc: Oral     SpO2: 95% 91% 96%   Weight:      Height:        Intake/Output Summary (Last 24 hours) at 09/15/2023 1417 Last data filed at 09/15/2023 1000 Gross per 24 hour  Intake 594 ml  Output --  Net 594 ml     Wt Readings from Last 3 Encounters:  09/10/23 57.5 kg  06/30/23 56.1 kg  06/16/23 54.9 kg     Exam General: Alert and oriented x 3, NAD, irritable Cardiovascular: S1 S2 auscultated,  RRR Respiratory: Bilateral wheezing Gastrointestinal: Soft, nontender, nondistended, + bowel sounds Ext: no pedal edema bilaterally Neuro: no new deficits Psych: irritable    Data Reviewed:  I have personally reviewed following labs    CBC Lab Results  Component Value Date   WBC 6.7 09/15/2023   RBC 2.34 (L) 09/15/2023   HGB 7.2 (L) 09/15/2023   HCT 22.1 (L) 09/15/2023   MCV 94.4 09/15/2023   MCH 30.8 09/15/2023   PLT 252 09/15/2023   MCHC 32.6 09/15/2023   RDW 18.6 (H) 09/15/2023   LYMPHSABS 1.1 09/06/2023   MONOABS 0.5 09/06/2023    EOSABS 0.1 09/06/2023   BASOSABS 0.0 09/06/2023     Last metabolic panel Lab Results  Component Value Date   NA 119 (LL) 09/15/2023  K 4.4 09/15/2023   CL 82 (L) 09/15/2023   CO2 21 (L) 09/15/2023   BUN 70 (H) 09/15/2023   CREATININE 5.13 (H) 09/15/2023   GLUCOSE 119 (H) 09/15/2023   GFRNONAA 9 (L) 09/15/2023   GFRAA 36 (L) 08/31/2019   CALCIUM  7.6 (L) 09/15/2023   PHOS 6.6 (H) 09/15/2023   PROT 6.1 (L) 09/07/2023   ALBUMIN  2.1 (L) 09/10/2023   LABGLOB 3.7 06/06/2022   AGRATIO 1.1 (L) 06/06/2022   BILITOT 0.5 09/07/2023   ALKPHOS 81 09/07/2023   AST 15 09/07/2023   ALT 9 09/07/2023   ANIONGAP 16 (H) 09/15/2023    CBG (last 3)  No results for input(s): GLUCAP in the last 72 hours.    Coagulation Profile: No results for input(s): INR, PROTIME in the last 168 hours.   Radiology Studies: I have personally reviewed the imaging studies  IR THORACENTESIS ASP PLEURAL SPACE W/IMG GUIDE Result Date: 09/15/2023 INDICATION: 63 year old female with ESRD presents with shortness of breast. Previous imaging showed bilateral pleural effusion, right greater than left. Request for therapeutic thoracentesis. EXAM: ULTRASOUND GUIDED RIGHT THORACENTESIS MEDICATIONS: 5 mL 1% lidocaine  COMPLICATIONS: None immediate. PROCEDURE: An ultrasound guided thoracentesis was thoroughly discussed with the patient and questions answered. The benefits, risks, alternatives and complications were also discussed. The patient understands and wishes to proceed with the procedure. Written consent was obtained. Ultrasound was performed to localize and mark an adequate pocket of fluid in the right chest. The area was then prepped and draped in the normal sterile fashion. 1% Lidocaine  was used for local anesthesia. Under ultrasound guidance a 6 Fr Safe-T-Centesis catheter was introduced. Thoracentesis was performed. The catheter was removed and a dressing applied. FINDINGS: A total of approximately 700 mL of  hazy amber fluid was removed. Post procedure chest X-ray reviewed, negative for pneumothorax. IMPRESSION: Successful ultrasound guided right thoracentesis yielding 700 mL of pleural fluid. Performed by: Aimee Han, PA-C Electronically Signed   By: Marcey Moan M.D.   On: 09/15/2023 10:42   DG Chest 1 View Result Date: 09/15/2023 CLINICAL DATA:  Right thoracentesis. EXAM: CHEST  1 VIEW COMPARISON:  09/13/2023 and CT chest 11/15/2021. FINDINGS: Trachea is midline. Heart is enlarged. Nodule aided small right pleural effusion, decreased in size from 09/13/2023, status post thoracentesis. No pneumothorax. Right perihilar consolidation with airspace opacification in the right middle and right lower lobes. Streaky atelectasis in the left lower lobe. IMPRESSION: 1. Small residual loculated right pleural effusion. No pneumothorax status post thoracentesis. 2. Right perihilar consolidation with airspace opacification in the right middle and right lower lobes, findings which may be due to pneumonia. Difficult to exclude malignancy. Recommend follow-up to clearing or CT chest with contrast, as clinically indicated. Electronically Signed   By: Newell Eke M.D.   On: 09/15/2023 08:53   DG Chest 2 View Result Date: 09/13/2023 CLINICAL DATA:  Shortness of breath.  Chest pain. EXAM: CHEST - 2 VIEW COMPARISON:  09/06/2023 FINDINGS: Right pleural effusion has increased from prior exam, at least moderate in size. Associated right basilar opacity. There is volume loss in the right hemithorax. Small left pleural effusion and basilar opacity, increasing. Mild cardiomegaly. Diffuse peribronchial thickening. No pneumothorax. IMPRESSION: 1. Increasing bilateral pleural effusions, right greater than left. Associated basilar opacities. 2. Mild cardiomegaly. Diffuse peribronchial thickening. Electronically Signed   By: Andrea Gasman M.D.   On: 09/13/2023 18:12       Akima Slaugh M.D. [facility] 09/15/2023, 2:17  PM  Available via Epic secure chat  7am-7pm After 7 pm, please refer to night coverage provider listed on amion.

## 2023-09-15 NOTE — Progress Notes (Addendum)
 [facility] Progress Note   Subjective:  Seen in room - she is eating breakfast and very upset with questions about dialysis. Refused HD last night. Our census is very high today, asked if she would be agreeable for HD tomorrow - she agrees for now. S/p R thora this AM - out -> breathing improved with this. Labs from yesterday with Na 119.  Objective Vitals:   09/15/23 0750 09/15/23 0825 09/15/23 0836 09/15/23 1102  BP: 132/60 (!) 130/55 (!) 122/53 (!) 122/53  Pulse: 75     Resp: 18     Temp: 97.9 F (36.6 C)     TempSrc: Oral     SpO2: 95% 91% 96%   Weight:      Height:       Physical Exam General: Chronically ill appearing, NAD Heart: RRR Lungs: CTA anteriorly - she is upset for more thorough exam Extremities: No LE edema Dialysis Access:  L AVF +t/b  Additional Objective Labs: Basic Metabolic Panel: Recent Labs  Lab 09/11/23 0646 09/12/23 0729 09/14/23 1044 09/15/23 0715  NA 130* 125* 123* 119*  K 3.5 4.0 4.0 4.4  CL 97* 91* 86* 82*  CO2 20* 23 20* 21*  GLUCOSE 97 122* 120* 119*  BUN 31* 45* 64* 70*  CREATININE 3.60* 4.09* 4.95* 5.13*  CALCIUM  7.3* 8.0* 7.7* 7.6*  PHOS 3.9  --  5.4* 6.6*   Liver Function Tests: Recent Labs  Lab 09/10/23 0542  ALBUMIN  2.1*   CBC: Recent Labs  Lab 09/10/23 0542 09/10/23 2029 09/11/23 0646 09/12/23 0729 09/14/23 1044 09/15/23 0715  WBC 5.9  --  5.7 5.6 8.2 6.7  HGB 6.5*   < > 8.5* 8.5* 8.6* 7.2*  HCT 19.8*   < > 25.5* 26.2* 26.0* 22.1*  MCV 94.3  --  91.4 94.2 95.6 94.4  PLT 308  --  288 266 270 252   < > = values in this interval not displayed.   Studies/Results: IR THORACENTESIS ASP PLEURAL SPACE W/IMG GUIDE Result Date: 09/15/2023 INDICATION: 63 year old female with ESRD presents with shortness of breast. Previous imaging showed bilateral pleural effusion, right greater than left. Request for therapeutic thoracentesis. EXAM: ULTRASOUND GUIDED RIGHT THORACENTESIS MEDICATIONS: 5 mL 1%  lidocaine  COMPLICATIONS: None immediate. PROCEDURE: An ultrasound guided thoracentesis was thoroughly discussed with the patient and questions answered. The benefits, risks, alternatives and complications were also discussed. The patient understands and wishes to proceed with the procedure. Written consent was obtained. Ultrasound was performed to localize and mark an adequate pocket of fluid in the right chest. The area was then prepped and draped in the normal sterile fashion. 1% Lidocaine  was used for local anesthesia. Under ultrasound guidance a 6 Fr Safe-T-Centesis catheter was introduced. Thoracentesis was performed. The catheter was removed and a dressing applied. FINDINGS: A total of approximately 700 mL of hazy amber fluid was removed. Post procedure chest X-ray reviewed, negative for pneumothorax. IMPRESSION: Successful ultrasound guided right thoracentesis yielding 700 mL of pleural fluid. Performed by: Aimee Han, PA-C Electronically Signed   By: Marcey Moan M.D.   On: 09/15/2023 10:42   DG Chest 1 View Result Date: 09/15/2023 CLINICAL DATA:  Right thoracentesis. EXAM: CHEST  1 VIEW COMPARISON:  09/13/2023 and CT chest 11/15/2021. FINDINGS: Trachea is midline. Heart is enlarged. Nodule aided small right pleural effusion, decreased in size from 09/13/2023, status post thoracentesis. No pneumothorax. Right perihilar consolidation with airspace opacification in the right middle and right lower lobes. Streaky atelectasis in the  left lower lobe. IMPRESSION: 1. Small residual loculated right pleural effusion. No pneumothorax status post thoracentesis. 2. Right perihilar consolidation with airspace opacification in the right middle and right lower lobes, findings which may be due to pneumonia. Difficult to exclude malignancy. Recommend follow-up to clearing or CT chest with contrast, as clinically indicated. Electronically Signed   By: Newell Eke M.D.   On: 09/15/2023 08:53   DG Chest 2  View Result Date: 09/13/2023 CLINICAL DATA:  Shortness of breath.  Chest pain. EXAM: CHEST - 2 VIEW COMPARISON:  09/06/2023 FINDINGS: Right pleural effusion has increased from prior exam, at least moderate in size. Associated right basilar opacity. There is volume loss in the right hemithorax. Small left pleural effusion and basilar opacity, increasing. Mild cardiomegaly. Diffuse peribronchial thickening. No pneumothorax. IMPRESSION: 1. Increasing bilateral pleural effusions, right greater than left. Associated basilar opacities. 2. Mild cardiomegaly. Diffuse peribronchial thickening. Electronically Signed   By: Andrea Gasman M.D.   On: 09/13/2023 18:12   Medications:   sodium chloride    Intravenous Once   sodium chloride    Intravenous Once   amLODipine   10 mg Oral Daily   atorvastatin   10 mg Oral Daily   budesonide -glycopyrrolate -formoterol   2 puff Inhalation BID   calcitRIOL   0.25 mcg Oral Daily   carvedilol   6.25 mg Oral BID WC   Chlorhexidine  Gluconate Cloth  6 each Topical Q0600   ferrous sulfate   325 mg Oral QODAY   folic acid   1 mg Oral Daily   gabapentin   100 mg Oral TID   lidocaine   10 mL Intradermal Once   multivitamin with minerals  1 tablet Oral Daily   nicotine   21 mg Transdermal Daily   pantoprazole   40 mg Oral BID   sertraline   25 mg Oral Daily   sodium bicarbonate   1,300 mg Oral BID   sodium chloride  flush  3 mL Intravenous Q12H   sucralfate   1 g Oral QID   thiamine   100 mg Oral Daily   torsemide   100 mg Oral Daily   Dialysis Orders: No outpatient unit.    Assessment/Plan: ESRD:  Does not have center, refuses regular HD. Does have some residual function. Last HD 4/10 before this admit. Was agreeable for HD here - started with low time/BFR on 6/23 and 6/25. Refused HD last night. Only wants to have dialysis when she feels she needs it. Dyspnea improved s/p R thora this AM with off. She is experiencing homelessness and does not have transportation so cannot be  placed in an outpatient dialysis clinic. Will plan for next HD tomorrow - see what happens. Hypokalemia. S/p KCl. Better now. Reducing PO bicarb load. Dyspnea: Pulm edema/effusions on CXR and with COPD/rhonchi as well. S/p R sided thora this AM. Volume removal with HD as tolerated.  Acute blood loss anemia.  Hgb 4.3 on admit. Hx of GIB. S/p 4 units PRBCs. Aranesp  100 given 6/22. S/p SBE - multiple non-bleeding AVMs found. Treated with APC.  Hypertension/volume: BP ok, meds resumed. Na low reflecting extra volume. Metabolic bone disease: CorrCa ok, Phos high. Nutrition: Alb low, adding supps while she is here. Severe leg pain/weakness: Possibly related to the above, she is high risk for PAD. COPD: On Breztri . Per primary  Dispo. Per primary.    Izetta Boehringer, PA-C 09/15/2023, 12:13 PM  [employer]

## 2023-09-15 NOTE — Plan of Care (Signed)
   Problem: Clinical Measurements: Goal: Respiratory complications will improve Outcome: Progressing   Problem: Pain Managment: Goal: General experience of comfort will improve and/or be controlled Outcome: Progressing   Problem: Safety: Goal: Ability to remain free from injury will improve Outcome: Progressing

## 2023-09-15 NOTE — Progress Notes (Signed)
 [facility], hospitalist service, overnight cross coverage  Received a call from bedside RN regarding the patient refusing hemodialysis overnight.  Per bedside RN, the patient is alert and oriented however she has been irritable.  Presented at the hemodialysis center.  Attempted to discuss with the patient the reason for her refusal.  She shouted she wants to return to her room and was not open to the discussion.  The patient continues to decline hemodialysis at the time of this visit.   No charge note.

## 2023-09-16 LAB — RENAL FUNCTION PANEL
Albumin: 2.2 g/dL — ABNORMAL LOW (ref 3.5–5.0)
Anion gap: 12 (ref 5–15)
BUN: 78 mg/dL — ABNORMAL HIGH (ref 8–23)
CO2: 20 mmol/L — ABNORMAL LOW (ref 22–32)
Calcium: 7.1 mg/dL — ABNORMAL LOW (ref 8.9–10.3)
Chloride: 85 mmol/L — ABNORMAL LOW (ref 98–111)
Creatinine, Ser: 5.42 mg/dL — ABNORMAL HIGH (ref 0.44–1.00)
GFR, Estimated: 8 mL/min — ABNORMAL LOW (ref >60)
Glucose, Bld: 115 mg/dL — ABNORMAL HIGH (ref 70–99)
Phosphorus: 6.8 mg/dL — ABNORMAL HIGH (ref 2.5–4.6)
Potassium: 4.6 mmol/L (ref 3.5–5.1)
Sodium: 117 mmol/L — CL (ref 135–145)

## 2023-09-16 MED ORDER — HEPARIN BOLUS VIA INFUSION: 2000.0000 [IU] | Freq: Once | INTRAVENOUS | Status: DC

## 2023-09-16 MED ORDER — DARBEPOETIN ALFA 150 MCG/0.3ML IJ SOSY
150.0000 ug | PREFILLED_SYRINGE | INTRAMUSCULAR | Status: DC
  Administered 2023-09-17: 150 ug via SUBCUTANEOUS
  Filled 2023-09-16: qty 0.3

## 2023-09-16 MED ORDER — HEPARIN SODIUM (PORCINE) 1000 UNIT/ML IJ SOLN
2000.0000 [IU] | Freq: Once | INTRAMUSCULAR | Status: DC
  Filled 2023-09-16: qty 2

## 2023-09-16 NOTE — Progress Notes (Signed)
 PROGRESS NOTE    [name]  FMW:[id number] DOB: 16-May-1960 DOA: 09/06/2023 PCP: Vicci Barnie NOVAK, MD  Brief Narrative:  63 yrs. 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  She reported needing dialysis but unable to get this done due to homelessness.  She has chronic anemia with a baseline hemoglobin of 8-10.  In the ED she was noted to have a hemoglobin of 4.3.  Her creatinine was 6.4, her potassium was 3.4.  The patient had a chest x-ray that showed a right pleural effusion and findings consistent with CHF.  Patient does have a history of congestive heart failure and is on beta-blockers plus diuretics for this.  Patient was given three units PRBC . Nephrology and Gastroenterology was consulted. She was admitted for further evaluation.  Assessment & Plan:   Principal Problem:   Acute blood loss anemia Active Problems:   Chronic diastolic CHF (congestive heart failure) (HCC)   Acute on chronic anemia   ESRD on dialysis [facility])   Essential hypertension   COPD (chronic obstructive pulmonary disease) (HCC)   Anxiety and depression   GERD (gastroesophageal reflux disease)   Hyperlipidemia   Gastric AVM   Tobacco abuse   Chronic leg pain   Polysubstance abuse (HCC)   Angiodysplasia of upper gastrointestinal tract   Acute blood loss anemia, acute on chronic anemia:  GERD, AVMs She is found to have hemoglobin 4.3 on arrival in ED, no acute obvious bleeding.  Transfused 3 units PRBC. Hb improved to 8.9, then subsequently dropped to 6.5. -Baseline hemoglobin is between 8 and 10.  Has history of upper GI bleeding due to GI AVMs, gastritis, also in the setting of ESRD -GI consulted, s/p EGD found to have 4 non bleeding AVMs in the duodenum and 2 in the jejunum treated with APC (cautery). GI  recommended iron  supplementation signed off -Continue Protonix , Carafate    Chronic diastolic CHF (congestive heart failure) [facility]): Right pleural effusion Right pleural effusion and findings of volume overload, CHF, on torsemide  -Volume controlled with dialysis - Status post right-sided thoracentesis today, 700 cc removed     ESRD on dialysis [facility]): -History of ESRD, homelessness, unable to get chronic dialysis due to homelessness and reports getting dialyzed occasionally when she feels like she needs it.   -Nephrology following, homeless and has no transport to go to HD.    Hyponatremia - Due to volume overload however refusing hemodialysis, currently alert and oriented - Sodium 118 management per nephrology -trying to pull off more fluid  Essential hypertension: Continue amlodipine , carvedilol .   COPD (chronic obstructive pulmonary disease) (HCC) Continue inhalers.   Anxiety and depression: Continue sertraline , trazodone .     Hyperlipidemia: -Continue Lipitor   Tobacco abuse: Patient reports cutting down on smoking.   Continue nicotine  patch    Polysubstance abuse (HCC) Patient continues to use cocaine sporadically.   She reports last use was approximately 4 to 5 days ago.   She denies any IV drug use. Social worker consult.   Estimated body mass index is 19.85 kg/m as calculated from the following:   Height as of this encounter: 5' 7 (1.702 m).   Weight as of this encounter: 57.5 kg.   Estimated body mass index is 19.85 kg/m as calculated from the following:   Height  as of this encounter: 5' 7 (1.702 m).   Weight as of this encounter: 57.5 kg.  DVT prophylaxis: scd Code Status:full Family Communication:none Disposition Plan:  Status is: Inpatient   Consultants: renal gi urology  Subjective: Patient is resting in bed she denies any complaints no nausea vomiting headaches change in vision her sodium is 117 this morning  Objective: Vitals:   09/15/23  1904 09/16/23 0547 09/16/23 0819 09/16/23 0903  BP: 121/60 (!) 143/68  (!) 126/56  Pulse: 72 68  72  Resp: (!) 22 18  18   Temp:  97.6 F (36.4 C)  97.6 F (36.4 C)  TempSrc:  Oral  Oral  SpO2: 90% 94% 93% 95%  Weight:      Height:        Intake/Output Summary (Last 24 hours) at 09/16/2023 1432 Last data filed at 09/16/2023 9070 Gross per 24 hour  Intake 6 ml  Output --  Net 6 ml   Filed Weights   09/10/23 0724 09/10/23 1321 09/10/23 1803  Weight: 53.3 kg 58.7 kg 57.5 kg    Examination:  General exam: Appears in nad Chronically ill appearing Respiratory system: Clear to auscultation. Respiratory effort normal. Cardiovascular system: S1 & S2 heard, RRR. No JVD, murmurs, rubs, gallops or clicks. No pedal edema. Gastrointestinal system: Abdomen is nondistended, soft and nontender. No organomegaly or masses felt. Normal bowel sounds heard. Central nervous system: Alert and oriented. No focal neurological deficits. Extremities: no edema     Data Reviewed: I have personally reviewed following labs and imaging studies  CBC: Recent Labs  Lab 09/10/23 0542 09/10/23 2029 09/11/23 0646 09/12/23 0729 09/14/23 1044 09/15/23 0715  WBC 5.9  --  5.7 5.6 8.2 6.7  HGB 6.5* 9.3* 8.5* 8.5* 8.6* 7.2*  HCT 19.8* 28.0* 25.5* 26.2* 26.0* 22.1*  MCV 94.3  --  91.4 94.2 95.6 94.4  PLT 308  --  288 266 270 252   Basic Metabolic Panel: Recent Labs  Lab 09/10/23 0542 09/11/23 0646 09/12/23 0729 09/14/23 1044 09/15/23 0715 09/16/23 0638  NA 129* 130* 125* 123* 119* 117*  K 3.5 3.5 4.0 4.0 4.4 4.6  CL 101 97* 91* 86* 82* 85*  CO2 18* 20* 23 20* 21* 20*  GLUCOSE 93 97 122* 120* 119* 115*  BUN 48* 31* 45* 64* 70* 78*  CREATININE 4.68* 3.60* 4.09* 4.95* 5.13* 5.42*  CALCIUM  7.0* 7.3* 8.0* 7.7* 7.6* 7.1*  MG  --  1.7  --  1.9 2.1  --   PHOS 3.7 3.9  --  5.4* 6.6* 6.8*   GFR: Estimated Creatinine Clearance: 9.6 mL/min (A) (by C-G formula based on SCr of 5.42 mg/dL (H)). Liver  Function Tests: Recent Labs  Lab 09/10/23 0542 09/16/23 9361  ALBUMIN  2.1* 2.2*   No results for input(s): LIPASE, AMYLASE in the last 168 hours. No results for input(s): AMMONIA in the last 168 hours. Coagulation Profile: No results for input(s): INR, PROTIME in the last 168 hours. Cardiac Enzymes: No results for input(s): CKTOTAL, CKMB, CKMBINDEX, TROPONINI in the last 168 hours. BNP (last 3 results) No results for input(s): PROBNP in the last 8760 hours. HbA1C: No results for input(s): HGBA1C in the last 72 hours. CBG: No results for input(s): GLUCAP in the last 168 hours. Lipid Profile: No results for input(s): CHOL, HDL, LDLCALC, TRIG, CHOLHDL, LDLDIRECT in the last 72 hours. Thyroid Function Tests: No results for input(s): TSH, T4TOTAL, FREET4, T3FREE, THYROIDAB in the last 72 hours. Anemia Panel: No results  for input(s): VITAMINB12, FOLATE, FERRITIN, TIBC, IRON , RETICCTPCT in the last 72 hours. Sepsis Labs: No results for input(s): PROCALCITON, LATICACIDVEN in the last 168 hours.  Recent Results (from the past 240 hours)  Resp panel by RT-PCR (RSV, Flu A&B, Covid) Anterior Nasal Swab     Status: None   Collection Time: 09/06/23  8:50 PM   Specimen: Anterior Nasal Swab  Result Value Ref Range Status   SARS Coronavirus 2 by RT PCR NEGATIVE NEGATIVE Final   Influenza A by PCR NEGATIVE NEGATIVE Final   Influenza B by PCR NEGATIVE NEGATIVE Final    Comment: (NOTE) The Xpert Xpress SARS-CoV-2/FLU/RSV plus assay is intended as an aid in the diagnosis of influenza from Nasopharyngeal swab specimens and should not be used as a sole basis for treatment. Nasal washings and aspirates are unacceptable for Xpert Xpress SARS-CoV-2/FLU/RSV testing.  Fact Sheet for Patients: BloggerCourse.com  Fact Sheet for Healthcare Providers: SeriousBroker.it  This test is not yet  approved or cleared by the [place]  FDA and has been authorized for detection and/or diagnosis of SARS-CoV-2 by FDA under an Emergency Use Authorization (EUA). This EUA will remain in effect (meaning this test can be used) for the duration of the COVID-19 declaration under Section 564(b)(1) of the Act, 21 U.S.C. section 360bbb-3(b)(1), unless the authorization is terminated or revoked.     Resp Syncytial Virus by PCR NEGATIVE NEGATIVE Final    Comment: (NOTE) Fact Sheet for Patients: BloggerCourse.com  Fact Sheet for Healthcare Providers: SeriousBroker.it  This test is not yet approved or cleared by the [place]  FDA and has been authorized for detection and/or diagnosis of SARS-CoV-2 by FDA under an Emergency Use Authorization (EUA). This EUA will remain in effect (meaning this test can be used) for the duration of the COVID-19 declaration under Section 564(b)(1) of the Act, 21 U.S.C. section 360bbb-3(b)(1), unless the authorization is terminated or revoked.  Performed at [facility], [address] [place], [place] [address]          Radiology Studies: IR THORACENTESIS ASP PLEURAL SPACE W/IMG GUIDE Result Date: 09/15/2023 INDICATION: 63 year old female with ESRD presents with shortness of breast. Previous imaging showed bilateral pleural effusion, right greater than left. Request for therapeutic thoracentesis. EXAM: ULTRASOUND GUIDED RIGHT THORACENTESIS MEDICATIONS: 5 mL 1% lidocaine  COMPLICATIONS: None immediate. PROCEDURE: An ultrasound guided thoracentesis was thoroughly discussed with the patient and questions answered. The benefits, risks, alternatives and complications were also discussed. The patient understands and wishes to proceed with the procedure. Written consent was obtained. Ultrasound was performed to localize and mark an adequate pocket of fluid in the right chest. The area was then prepped and  draped in the normal sterile fashion. 1% Lidocaine  was used for local anesthesia. Under ultrasound guidance a 6 Fr Safe-T-Centesis catheter was introduced. Thoracentesis was performed. The catheter was removed and a dressing applied. FINDINGS: A total of approximately 700 mL of hazy amber fluid was removed. Post procedure chest X-ray reviewed, negative for pneumothorax. IMPRESSION: Successful ultrasound guided right thoracentesis yielding 700 mL of pleural fluid. Performed by: Aimee Han, PA-C Electronically Signed   By: Marcey Moan M.D.   On: 09/15/2023 10:42   DG Chest 1 View Result Date: 09/15/2023 CLINICAL DATA:  Right thoracentesis. EXAM: CHEST  1 VIEW COMPARISON:  09/13/2023 and CT chest 11/15/2021. FINDINGS: Trachea is midline. Heart is enlarged. Nodule aided small right pleural effusion, decreased in size from 09/13/2023, status post thoracentesis. No pneumothorax. Right perihilar consolidation with airspace opacification  in the right middle and right lower lobes. Streaky atelectasis in the left lower lobe. IMPRESSION: 1. Small residual loculated right pleural effusion. No pneumothorax status post thoracentesis. 2. Right perihilar consolidation with airspace opacification in the right middle and right lower lobes, findings which may be due to pneumonia. Difficult to exclude malignancy. Recommend follow-up to clearing or CT chest with contrast, as clinically indicated. Electronically Signed   By: Newell Eke M.D.   On: 09/15/2023 08:53    Scheduled Meds:  sodium chloride    Intravenous Once   sodium chloride    Intravenous Once   amLODipine   10 mg Oral Daily   atorvastatin   10 mg Oral Daily   budesonide -glycopyrrolate -formoterol   2 puff Inhalation BID   calcitRIOL   0.25 mcg Oral Daily   carvedilol   6.25 mg Oral BID WC   Chlorhexidine  Gluconate Cloth  6 each Topical Q0600   darbepoetin (ARANESP ) injection - DIALYSIS  150 mcg Subcutaneous Q Tue-1800   ferrous sulfate   325 mg Oral QODAY    folic acid   1 mg Oral Daily   gabapentin   100 mg Oral TID   heparin  sodium (porcine)  2,000 Units Intravenous Once   lidocaine   10 mL Intradermal Once   multivitamin with minerals  1 tablet Oral Daily   nicotine   21 mg Transdermal Daily   pantoprazole   40 mg Oral BID   sertraline   25 mg Oral Daily   sodium bicarbonate   650 mg Oral BID   sodium chloride  flush  3 mL Intravenous Q12H   sucralfate   1 g Oral QID   thiamine   100 mg Oral Daily   torsemide   100 mg Oral Daily   Continuous Infusions:   LOS: 10 days    Cynthia KANDICE Hoots, MD  09/16/2023, 2:32 PM

## 2023-09-16 NOTE — Progress Notes (Addendum)
 Pt. Sign of AMA in the last 30 mins/D/T pt, states, coughing.  09/16/23 1903  Vitals  Temp 97.9 F (36.6 C)  Pulse Rate 88  Resp 19  BP (!) 132/51  O2 Device Nasal Cannula  Weight 64 kg  Oxygen Therapy  Patient Activity (if Appropriate) In bed  Pulse Oximetry Type Continuous  Post Treatment  Dialyzer Clearance Lightly streaked  Hemodialysis Intake (mL) 0 mL  Liters Processed 67.6  Fluid Removed (mL) 3 mL  Tolerated HD Treatment Yes  AVG/AVF Arterial Site Held (minutes) 10 minutes  AVG/AVF Venous Site Held (minutes) 10 minutes   Received patient in bed to unit.  Alert and oriented.  Informed consent signed and in chart.   TX duration:3.00  Patient tolerated well.  Transported back to the room  Alert, without acute distress.  Hand-off given to patient's nurse.   Access used: Yes Access issues: No  Total UF removed: 3000 Medication(s) given: See MAR Post HD VS: See Above Grid Post HD weight: 64 kg   [facility]ebed[facility] DELENA [facility]

## 2023-09-16 NOTE — Progress Notes (Signed)
 [name] Progress Note   Subjective:   Seen in room - agreeable to taking meds and HD today - tells me she is wanting to pursue regular HD and housing. Unclear if she will change her mind, but will send message to SW/CM to address. Still with dyspnea, no CP.  Objective Vitals:   09/15/23 1904 09/16/23 0547 09/16/23 0819 09/16/23 0903  BP: 121/60 (!) 143/68  (!) 126/56  Pulse: 72 68  72  Resp: (!) 22 18  18   Temp:  97.6 F (36.4 C)  97.6 F (36.4 C)  TempSrc:  Oral  Oral  SpO2: 90% 94% 93% 95%  Weight:      Height:       Physical Exam General: Chronically ill appearing, NAD Heart: RRR Lungs: Scattered rhonchi Extremities: No LE edema Dialysis Access:  L AVF +t/b  Additional Objective Labs: Basic Metabolic Panel: Recent Labs  Lab 09/14/23 1044 09/15/23 0715 09/16/23 0638  NA 123* 119* 117*  K 4.0 4.4 4.6  CL 86* 82* 85*  CO2 20* 21* 20*  GLUCOSE 120* 119* 115*  BUN 64* 70* 78*  CREATININE 4.95* 5.13* 5.42*  CALCIUM  7.7* 7.6* 7.1*  PHOS 5.4* 6.6* 6.8*   Liver Function Tests: Recent Labs  Lab 09/10/23 0542 09/16/23 0638  ALBUMIN  2.1* 2.2*   CBC: Recent Labs  Lab 09/10/23 0542 09/10/23 2029 09/11/23 0646 09/12/23 0729 09/14/23 1044 09/15/23 0715  WBC 5.9  --  5.7 5.6 8.2 6.7  HGB 6.5*   < > 8.5* 8.5* 8.6* 7.2*  HCT 19.8*   < > 25.5* 26.2* 26.0* 22.1*  MCV 94.3  --  91.4 94.2 95.6 94.4  PLT 308  --  288 266 270 252   < > = values in this interval not displayed.   Studies/Results: IR THORACENTESIS ASP PLEURAL SPACE W/IMG GUIDE Result Date: 09/15/2023 INDICATION: 63 year old female with ESRD presents with shortness of breast. Previous imaging showed bilateral pleural effusion, right greater than left. Request for therapeutic thoracentesis. EXAM: ULTRASOUND GUIDED RIGHT THORACENTESIS MEDICATIONS: 5 mL 1% lidocaine  COMPLICATIONS: None immediate. PROCEDURE: An ultrasound guided thoracentesis was thoroughly discussed with the patient and  questions answered. The benefits, risks, alternatives and complications were also discussed. The patient understands and wishes to proceed with the procedure. Written consent was obtained. Ultrasound was performed to localize and mark an adequate pocket of fluid in the right chest. The area was then prepped and draped in the normal sterile fashion. 1% Lidocaine  was used for local anesthesia. Under ultrasound guidance a 6 Fr Safe-T-Centesis catheter was introduced. Thoracentesis was performed. The catheter was removed and a dressing applied. FINDINGS: A total of approximately 700 mL of hazy amber fluid was removed. Post procedure chest X-ray reviewed, negative for pneumothorax. IMPRESSION: Successful ultrasound guided right thoracentesis yielding 700 mL of pleural fluid. Performed by: Aimee Han, PA-C Electronically Signed   By: Marcey Moan M.D.   On: 09/15/2023 10:42   DG Chest 1 View Result Date: 09/15/2023 CLINICAL DATA:  Right thoracentesis. EXAM: CHEST  1 VIEW COMPARISON:  09/13/2023 and CT chest 11/15/2021. FINDINGS: Trachea is midline. Heart is enlarged. Nodule aided small right pleural effusion, decreased in size from 09/13/2023, status post thoracentesis. No pneumothorax. Right perihilar consolidation with airspace opacification in the right middle and right lower lobes. Streaky atelectasis in the left lower lobe. IMPRESSION: 1. Small residual loculated right pleural effusion. No pneumothorax status post thoracentesis. 2. Right perihilar consolidation with airspace opacification in the right middle and right  lower lobes, findings which may be due to pneumonia. Difficult to exclude malignancy. Recommend follow-up to clearing or CT chest with contrast, as clinically indicated. Electronically Signed   By: Newell Eke M.D.   On: 09/15/2023 08:53   Medications:   sodium chloride    Intravenous Once   sodium chloride    Intravenous Once   amLODipine   10 mg Oral Daily   atorvastatin   10 mg Oral Daily    budesonide -glycopyrrolate -formoterol   2 puff Inhalation BID   calcitRIOL   0.25 mcg Oral Daily   carvedilol   6.25 mg Oral BID WC   Chlorhexidine  Gluconate Cloth  6 each Topical Q0600   ferrous sulfate   325 mg Oral QODAY   folic acid   1 mg Oral Daily   gabapentin   100 mg Oral TID   lidocaine   10 mL Intradermal Once   multivitamin with minerals  1 tablet Oral Daily   nicotine   21 mg Transdermal Daily   pantoprazole   40 mg Oral BID   sertraline   25 mg Oral Daily   sodium bicarbonate   650 mg Oral BID   sodium chloride  flush  3 mL Intravenous Q12H   sucralfate   1 g Oral QID   thiamine   100 mg Oral Daily   torsemide   100 mg Oral Daily   Dialysis Orders: No outpatient unit.    Assessment/Plan: ESRD:  Does not have center, refuses regular HD. Does have some residual function. Last HD 4/10 before this admit. Was agreeable for HD here - started with low time/BFR on 6/23 and 6/25. Refused HD Sun 6/29.  Dyspnea improved s/p R thora 6/30 with off. She is experiencing homelessness and does not have transportation so cannot be placed in an outpatient dialysis clinic. Agreeable for HD today - dyspneic. Also seems to be in a good mood and reports that she is interested in permanent housing - unclear if will change her mind, but will engage SW. Hypokalemia. S/p KCl. Using higher K bath with HD. Dyspnea: Pulm edema/effusions on CXR and with COPD/rhonchi as well. S/p R sided thora this AM. Volume removal with HD as tolerated.  Acute blood loss anemia.  Hgb 4.3 on admit. Hx of GIB. S/p 4 units PRBCs. Aranesp  100 given 6/22. 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 Hgb down to 7.2 today - some dilutional component. Will give another Aranesp  today. Hypertension/volume: BP ok, meds resumed. Na low reflecting extra volume. Metabolic bone disease: CorrCa ok, Phos high -> start binder. Nutrition: Alb low, adding supps while she is here. Severe leg pain/weakness: Possibly related to the above, she  is high risk for PAD. COPD: On Breztri . Per primary  Dispo. Per primary.    Cynthia Boehringer, PA-C 09/16/2023, 9:39 AM  [employer]

## 2023-09-17 DIAGNOSIS — M79605 Pain in left leg: Secondary | ICD-10-CM

## 2023-09-17 DIAGNOSIS — F419 Anxiety disorder, unspecified: Secondary | ICD-10-CM

## 2023-09-17 DIAGNOSIS — K2289 Other specified disease of esophagus: Secondary | ICD-10-CM

## 2023-09-17 DIAGNOSIS — M79604 Pain in right leg: Secondary | ICD-10-CM

## 2023-09-17 DIAGNOSIS — G8929 Other chronic pain: Secondary | ICD-10-CM

## 2023-09-17 DIAGNOSIS — J441 Chronic obstructive pulmonary disease with (acute) exacerbation: Secondary | ICD-10-CM

## 2023-09-17 DIAGNOSIS — Z720 Tobacco use: Secondary | ICD-10-CM

## 2023-09-17 DIAGNOSIS — I10 Essential (primary) hypertension: Secondary | ICD-10-CM

## 2023-09-17 DIAGNOSIS — F32A Depression, unspecified: Secondary | ICD-10-CM

## 2023-09-17 DIAGNOSIS — E785 Hyperlipidemia, unspecified: Secondary | ICD-10-CM

## 2023-09-17 DIAGNOSIS — F1910 Other psychoactive substance abuse, uncomplicated: Secondary | ICD-10-CM

## 2023-09-17 DIAGNOSIS — K31819 Angiodysplasia of stomach and duodenum without bleeding: Secondary | ICD-10-CM

## 2023-09-17 LAB — RENAL FUNCTION PANEL
Albumin: 2.1 g/dL — ABNORMAL LOW (ref 3.5–5.0)
Anion gap: 13 (ref 5–15)
BUN: 39 mg/dL — ABNORMAL HIGH (ref 8–23)
CO2: 26 mmol/L (ref 22–32)
Calcium: 7.8 mg/dL — ABNORMAL LOW (ref 8.9–10.3)
Chloride: 87 mmol/L — ABNORMAL LOW (ref 98–111)
Creatinine, Ser: 3.32 mg/dL — ABNORMAL HIGH (ref 0.44–1.00)
GFR, Estimated: 15 mL/min — ABNORMAL LOW (ref >60)
Glucose, Bld: 99 mg/dL (ref 70–99)
Phosphorus: 4.8 mg/dL — ABNORMAL HIGH (ref 2.5–4.6)
Potassium: 3.8 mmol/L (ref 3.5–5.1)
Sodium: 126 mmol/L — ABNORMAL LOW (ref 135–145)

## 2023-09-17 LAB — GLUCOSE, CAPILLARY: Glucose-Capillary: 125 mg/dL — ABNORMAL HIGH (ref 70–99)

## 2023-09-17 MED ORDER — GUAIFENESIN-DM 100-10 MG/5ML PO SYRP
5.0000 mL | ORAL_SOLUTION | ORAL | Status: DC | PRN
  Administered 2023-09-17 – 2023-09-18 (×3): 5 mL via ORAL
  Filled 2023-09-17 (×3): qty 5

## 2023-09-17 NOTE — Progress Notes (Signed)
 Mobility Specialist Progress Note:    09/17/23 1437  Mobility  Activity Ambulated with assistance in hallway  Level of Assistance Contact guard assist, steadying assist  Assistive Device Cane  Distance Ambulated (ft) 248 ft  Activity Response Tolerated well  Mobility Referral Yes  Mobility visit 1 Mobility  Mobility Specialist Start Time (ACUTE ONLY) 1351  Mobility Specialist Stop Time (ACUTE ONLY) 1411  Mobility Specialist Time Calculation (min) (ACUTE ONLY) 20 min   Received pt in bed and agreeable to mobility. No physical assistance needed. Found pt on 3.5 L/min. Ambulated on 4 L/min, no c/o of SOB. C/o R side abdominal pain. Returned to room without fault. Pt left in bed. Personal belongings and call light within reach. All needs met.   Lavanda Pollack Mobility Specialist  Please contact via [employer] or  Rehab Office [phone]

## 2023-09-17 NOTE — Plan of Care (Signed)
°  Problem: Clinical Measurements: °Goal: Ability to maintain clinical measurements within normal limits will improve °Outcome: Progressing °  °Problem: Activity: °Goal: Risk for activity intolerance will decrease °Outcome: Progressing °  °Problem: Coping: °Goal: Level of anxiety will decrease °Outcome: Progressing °  °Problem: Elimination: °Goal: Will not experience complications related to bowel motility °Outcome: Progressing °  °Problem: Safety: °Goal: Ability to remain free from injury will improve °Outcome: Progressing °  °Problem: Skin Integrity: °Goal: Risk for impaired skin integrity will decrease °Outcome: Progressing °  °

## 2023-09-17 NOTE — Plan of Care (Signed)
  Problem: Coping: Goal: Level of anxiety will decrease Outcome: Progressing   Problem: Nutrition: Goal: Adequate nutrition will be maintained Outcome: Progressing   Problem: Activity: Goal: Risk for activity intolerance will decrease Outcome: Progressing   Problem: Elimination: Goal: Will not experience complications related to bowel motility Outcome: Progressing   Problem: Safety: Goal: Ability to remain free from injury will improve Outcome: Progressing

## 2023-09-17 NOTE — Progress Notes (Signed)
 [facility] Progress Note   Subjective:  Seen in room - resting, but wakes for questions. Still with dyspnea but getting better. S/p HD yesterday - did ok and 3L removed.  Objective Vitals:   09/16/23 1903 09/16/23 2229 09/17/23 0818 09/17/23 0834  BP: (!) 132/51 (!) 142/59 (!) 135/59 (!) 135/59  Pulse: 88 78 (!) 59 72  Resp: 19 17 18 18   Temp: 97.9 F (36.6 C) 99.9 F (37.7 C)  97.7 F (36.5 C)  TempSrc:  Oral  Oral  SpO2:  96% 94% 99%  Weight: 64 kg     Height:       Physical Exam General: Chronically ill appearing, NAD Heart: RRR Lungs: Scattered rhonchi Extremities: No LE edema Dialysis Access:  L AVF +t/b  Additional Objective Labs: Basic Metabolic Panel: Recent Labs  Lab 09/15/23 0715 09/16/23 0638 09/17/23 0639  NA 119* 117* 126*  K 4.4 4.6 3.8  CL 82* 85* 87*  CO2 21* 20* 26  GLUCOSE 119* 115* 99  BUN 70* 78* 39*  CREATININE 5.13* 5.42* 3.32*  CALCIUM  7.6* 7.1* 7.8*  PHOS 6.6* 6.8* 4.8*   Liver Function Tests: Recent Labs  Lab 09/16/23 0638 09/17/23 0639  ALBUMIN  2.2* 2.1*   CBC: Recent Labs  Lab 09/11/23 0646 09/12/23 0729 09/14/23 1044 09/15/23 0715  WBC 5.7 5.6 8.2 6.7  HGB 8.5* 8.5* 8.6* 7.2*  HCT 25.5* 26.2* 26.0* 22.1*  MCV 91.4 94.2 95.6 94.4  PLT 288 266 270 252   Medications:   sodium chloride    Intravenous Once   sodium chloride    Intravenous Once   amLODipine   10 mg Oral Daily   atorvastatin   10 mg Oral Daily   budesonide -glycopyrrolate -formoterol   2 puff Inhalation BID   calcitRIOL   0.25 mcg Oral Daily   carvedilol   6.25 mg Oral BID WC   Chlorhexidine  Gluconate Cloth  6 each Topical Q0600   darbepoetin (ARANESP ) injection - DIALYSIS  150 mcg Subcutaneous Q Tue-1800   ferrous sulfate   325 mg Oral QODAY   folic acid   1 mg Oral Daily   gabapentin   100 mg Oral TID   heparin  sodium (porcine)  2,000 Units Intravenous Once   lidocaine   10 mL Intradermal Once   multivitamin with minerals  1 tablet Oral Daily    nicotine   21 mg Transdermal Daily   pantoprazole   40 mg Oral BID   sertraline   25 mg Oral Daily   sodium bicarbonate   650 mg Oral BID   sodium chloride  flush  3 mL Intravenous Q12H   sucralfate   1 g Oral QID   thiamine   100 mg Oral Daily   torsemide   100 mg Oral Daily   Dialysis Orders: No outpatient unit.    Assessment/Plan: ESRD:  Does not have center, refuses regular HD. Does have some residual function. Last HD 4/10 before this admit. Was agreeable for HD here - started with low time/BFR on 6/23 and 6/25. Refused HD Sun 6/29.  Dyspnea improved s/p R thora 6/30 with off. She is experiencing homelessness and does not have transportation so cannot be placed in an outpatient dialysis clinic. Dialyzed 7/1 -> planning next HD tomorrow.  Hypokalemia. S/p KCl. Using higher K bath with HD. Dyspnea: Pulm edema/effusions on CXR and with COPD/rhonchi as well. S/p R sided thora + volume removal with HD as tolerated.  Acute blood loss anemia.  Hgb 4.3 on admit. Hx of GIB. S/p 4 units PRBCs. Aranesp  100 given 6/22. 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 Continue Aranesp  150mcg q Tuesday and PO iron  for now. On sucralfate  - would limit this to 2 weeks. Hypertension/volume: BP ok, meds resumed. Na low reflecting extra volume - improved. Metabolic bone disease: CorrCa/Phos to goal - continue VDRA, not on binder. Nutrition: Alb low, adding supps while she is here. COPD: On Breztri . Per primary  Dispo. Per primary.    Izetta Boehringer, PA-C 09/17/2023, 10:38 AM  [facility]

## 2023-09-17 NOTE — Progress Notes (Signed)
 PROGRESS NOTE  [name] FMW:[id number] DOB: 1960/05/04   PCP: Vicci Barnie NOVAK, MD  Patient is from: Homeless.  DOA: 09/06/2023 LOS: 11  Chief complaints Chief Complaint  Patient presents with   Leg Pain   Chest Pain   Shortness of Breath     Brief Narrative / Interim history: 63 year old F with PMH of ESRD not on HD due to homelessness, polysubstance use including cocaine and tobacco, chronic GI bleed due to AVMs, PUD, COPD, HTN, GERD and chronic leg pain presented to ED for dialysis and found to have significant anemia with hemoglobin of 4.3.  BNP elevated to 1183.  Troponin elevated to 86.   Patient was transfused 3 units and hemoglobin improved to 8.9 before dropping to 6.5.  She was transfused 4 units and hemoglobin improved to 8.5.  Small bowel endoscopy showed 4 nonbleeding AVMs in the duodenum and 2 in the jejunum treated with APC (cautery).  GI recommended iron  supplementation and signed off.  Patient also had right pleural effusion for which she had thoracocentesis with removal of 700 cc transudative fluid on 6/30.   In regards to ESRD, nephrology consulted and following.  Complicated disposition due to homelessness, ESRD and substance use.   Subjective: Seen and examined earlier this morning.  No major events overnight or this morning.  Reports improvement in her breathing.  She is complaining of left earlobe swelling and pain which has been going on for 6 months.  Reports feeling more pain here.   Objective: Vitals:   09/16/23 1903 09/16/23 2229 09/17/23 0818 09/17/23 0834  BP: (!) 132/51 (!) 142/59 (!) 135/59 (!) 135/59  Pulse: 88 78 (!) 59 72  Resp: 19 17 18 18   Temp: 97.9 F (36.6 C) 99.9 F (37.7 C)  97.7 F (36.5 C)  TempSrc:  Oral  Oral  SpO2:  96% 94% 99%  Weight: 64 kg     Height:        Examination:  GENERAL: No apparent distress.  Nontoxic. HEENT: MMM.  Vision and hearing grossly intact.  Swelling at the inferior tip of the earlobe.   Cyst? NECK: Supple.  No apparent JVD.  RESP:  No IWOB.  Fair aeration bilaterally. CVS:  RRR. Heart sounds normal.  ABD/GI/GU: BS+. Abd soft, NTND.  MSK/EXT:  Moves extremities. No apparent deformity. No edema.  SKIN: no apparent skin lesion or wound NEURO: AA.  Oriented appropriately.  No apparent focal neuro deficit. PSYCH: Calm. Normal affect.   Consultants:  Gastroenterology Nephrology  Procedures: 6/24-small bowel endoscopy 6/30-right thoracocentesis with removal of 700 cc fluid  Microbiology summarized: COVID-19, influenza and RSV PCR nonreactive  Assessment and plan: Acute on chronic blood loss anemia superimposed on anemia of renal disease: Small bowel endoscopy showed nonbleeding AVMs in duodenum and jejunum that were treated with APC.  Transfused a total of 4 units. Recent Labs    09/06/23 1543 09/07/23 0923 09/08/23 0644 09/09/23 0628 09/10/23 0542 09/10/23 2029 09/11/23 0646 09/12/23 0729 09/14/23 1044 09/15/23 0715  HGB 4.3* 8.9* 7.5* 7.3* 6.5* 9.3* 8.5* 8.5* 8.6* 7.2*  -Recheck hemoglobin in the morning -Continue Protonix  and Carafate  -Defer Aranesp  and IV iron  to nephrology  Duodenal/duodenal AVM: Nonbleeding AVMs noted on endoscopy.  Treated with APC. -Protonix  and Carafate  as above  ESRD not on HD due to homelessness Bone mineral disorder -Per nephrology.  Chronic diastolic CHF: TTE in 01/2023 with LVEF of 50 to 55%.  Appears euvolemic on exam.  S/p right thoracocentesis with removal of  700 cc on 6/30. - Fluid management by HD per nephrology  Right pleural effusion: Likely due to CHF and ESRD. - S/p right-sided thoracentesis on 6/30. -Fluid management by HD  Acute respiratory failure with hypoxia: Due to COPD, CHF/effusion and ESRD.  Currently requiring 4 L to maintain appropriate saturation -Wean oxygen -Incentive quality, OOB, PT/OT   Hyponatremia: Likely due to ESRD.  Improved. - Continue monitoring   Essential hypertension:  Normotensive -Continue amlodipine , carvedilol .   Chronic COPD -Continue inhalers.   Anxiety and depression: -Continue sertraline , trazodone .   Hyperlipidemia: -Continue Lipitor   Tobacco abuse:reports cutting down on smoking.   -Continue nicotine  patch   Polysubstance abuse: Continues to use cocaine.  Last use about 4 to 5 days before coming to ED. -Encouraged cessation - TOC consulted  Cystic lesion of left ear: Ongoing for 6 months. - Warm compress  Homelessness: Complicating care.  Body mass index is 22.1 kg/m.          DVT prophylaxis:  SCDs Start: 09/06/23 2047  Code Status: DNR Family Communication: None at bedside Level of care: Telemetry Medical Status is: Inpatient Remains inpatient appropriate because: Homelessness, anemia, ESRD   Final disposition: To be determined   55 minutes with more than 50% spent in reviewing records, counseling patient/family and coordinating care.   Sch Meds:  Scheduled Meds:  sodium chloride    Intravenous Once   sodium chloride    Intravenous Once   amLODipine   10 mg Oral Daily   atorvastatin   10 mg Oral Daily   budesonide -glycopyrrolate -formoterol   2 puff Inhalation BID   calcitRIOL   0.25 mcg Oral Daily   carvedilol   6.25 mg Oral BID WC   Chlorhexidine  Gluconate Cloth  6 each Topical Q0600   darbepoetin (ARANESP ) injection - DIALYSIS  150 mcg Subcutaneous Q Tue-1800   ferrous sulfate   325 mg Oral QODAY   folic acid   1 mg Oral Daily   gabapentin   100 mg Oral TID   heparin  sodium (porcine)  2,000 Units Intravenous Once   lidocaine   10 mL Intradermal Once   multivitamin with minerals  1 tablet Oral Daily   nicotine   21 mg Transdermal Daily   pantoprazole   40 mg Oral BID   sertraline   25 mg Oral Daily   sodium bicarbonate   650 mg Oral BID   sodium chloride  flush  3 mL Intravenous Q12H   sucralfate   1 g Oral QID   thiamine   100 mg Oral Daily   torsemide   100 mg Oral Daily   Continuous Infusions: PRN  Meds:.acetaminophen  **OR** acetaminophen , acetaminophen , albuterol , guaiFENesin -dextromethorphan , metoprolol  tartrate, ondansetron  **OR** ondansetron  (ZOFRAN ) IV, oxyCODONE , polyethylene glycol, sodium chloride  flush, traZODone   Antimicrobials: Anti-infectives (From admission, onward)    None        I have personally reviewed the following labs and images: CBC: Recent Labs  Lab 09/10/23 2029 09/11/23 0646 09/12/23 0729 09/14/23 1044 09/15/23 0715  WBC  --  5.7 5.6 8.2 6.7  HGB 9.3* 8.5* 8.5* 8.6* 7.2*  HCT 28.0* 25.5* 26.2* 26.0* 22.1*  MCV  --  91.4 94.2 95.6 94.4  PLT  --  288 266 270 252   BMP &GFR Recent Labs  Lab 09/11/23 0646 09/12/23 0729 09/14/23 1044 09/15/23 0715 09/16/23 0638 09/17/23 0639  NA 130* 125* 123* 119* 117* 126*  K 3.5 4.0 4.0 4.4 4.6 3.8  CL 97* 91* 86* 82* 85* 87*  CO2 20* 23 20* 21* 20* 26  GLUCOSE 97 122* 120* 119* 115* 99  BUN 31* 45* 64* 70* 78* 39*  CREATININE 3.60* 4.09* 4.95* 5.13* 5.42* 3.32*  CALCIUM  7.3* 8.0* 7.7* 7.6* 7.1* 7.8*  MG 1.7  --  1.9 2.1  --   --   PHOS 3.9  --  5.4* 6.6* 6.8* 4.8*   Estimated Creatinine Clearance: 16.9 mL/min (A) (by C-G formula based on SCr of 3.32 mg/dL (H)). Liver & Pancreas: Recent Labs  Lab 09/16/23 0638 09/17/23 0639  ALBUMIN  2.2* 2.1*   No results for input(s): LIPASE, AMYLASE in the last 168 hours. No results for input(s): AMMONIA in the last 168 hours. Diabetic: No results for input(s): HGBA1C in the last 72 hours. Recent Labs  Lab 09/16/23 2246  GLUCAP 125*   Cardiac Enzymes: No results for input(s): CKTOTAL, CKMB, CKMBINDEX, TROPONINI in the last 168 hours. No results for input(s): PROBNP in the last 8760 hours. Coagulation Profile: No results for input(s): INR, PROTIME in the last 168 hours. Thyroid Function Tests: No results for input(s): TSH, T4TOTAL, FREET4, T3FREE, THYROIDAB in the last 72 hours. Lipid Profile: No results for input(s):  CHOL, HDL, LDLCALC, TRIG, CHOLHDL, LDLDIRECT in the last 72 hours. Anemia Panel: No results for input(s): VITAMINB12, FOLATE, FERRITIN, TIBC, IRON , RETICCTPCT in the last 72 hours. Urine analysis:    Component Value Date/Time   COLORURINE YELLOW 04/16/2023 0200   APPEARANCEUR HAZY (A) 04/16/2023 0200   LABSPEC 1.009 04/16/2023 0200   PHURINE 6.0 04/16/2023 0200   GLUCOSEU 50 (A) 04/16/2023 0200   HGBUR SMALL (A) 04/16/2023 0200   BILIRUBINUR NEGATIVE 04/16/2023 0200   KETONESUR NEGATIVE 04/16/2023 0200   PROTEINUR >=300 (A) 04/16/2023 0200   UROBILINOGEN 0.2 03/02/2010 2027   NITRITE NEGATIVE 04/16/2023 0200   LEUKOCYTESUR MODERATE (A) 04/16/2023 0200   Sepsis Labs: Invalid input(s): PROCALCITONIN, LACTICIDVEN  Microbiology: No results found for this or any previous visit (from the past 240 hours).  Radiology Studies: No results found.    [place] [facility]  If 7PM-7AM, please contact night-coverage [email or web address] 09/17/2023, 12:58 PM

## 2023-09-18 LAB — RENAL FUNCTION PANEL
Albumin: 2.1 g/dL — ABNORMAL LOW (ref 3.5–5.0)
Anion gap: 11 (ref 5–15)
BUN: 52 mg/dL — ABNORMAL HIGH (ref 8–23)
CO2: 26 mmol/L (ref 22–32)
Calcium: 8.2 mg/dL — ABNORMAL LOW (ref 8.9–10.3)
Chloride: 87 mmol/L — ABNORMAL LOW (ref 98–111)
Creatinine, Ser: 4.09 mg/dL — ABNORMAL HIGH (ref 0.44–1.00)
GFR, Estimated: 12 mL/min — ABNORMAL LOW (ref >60)
Glucose, Bld: 94 mg/dL (ref 70–99)
Phosphorus: 5.7 mg/dL — ABNORMAL HIGH (ref 2.5–4.6)
Potassium: 4.1 mmol/L (ref 3.5–5.1)
Sodium: 124 mmol/L — ABNORMAL LOW (ref 135–145)

## 2023-09-18 LAB — CBC
HCT: 21.7 % — ABNORMAL LOW (ref 36.0–46.0)
Hemoglobin: 6.8 g/dL — CL (ref 12.0–15.0)
MCH: 29.7 pg (ref 26.0–34.0)
MCHC: 31.3 g/dL (ref 30.0–36.0)
MCV: 94.8 fL (ref 80.0–100.0)
Platelets: 297 10*3/uL (ref 150–400)
RBC: 2.29 MIL/uL — ABNORMAL LOW (ref 3.87–5.11)
RDW: 17.9 % — ABNORMAL HIGH (ref 11.5–15.5)
WBC: 5 10*3/uL (ref 4.0–10.5)
nRBC: 0 % (ref 0.0–0.2)

## 2023-09-18 LAB — PREPARE RBC (CROSSMATCH)

## 2023-09-18 MED ORDER — SODIUM CHLORIDE 0.9% IV SOLUTION: Freq: Once | INTRAVENOUS | Status: AC

## 2023-09-18 MED ORDER — LIDOCAINE HCL (PF) 1 % IJ SOLN: 5.0000 mL | INTRAMUSCULAR | Status: DC | PRN

## 2023-09-18 MED ORDER — LIDOCAINE-PRILOCAINE 2.5-2.5 % EX CREA: 1.0000 | TOPICAL_CREAM | CUTANEOUS | Status: DC | PRN

## 2023-09-18 MED ORDER — PROSOURCE PLUS PO LIQD: 30.0000 mL | Freq: Two times a day (BID) | ORAL | Status: DC

## 2023-09-18 MED ORDER — PENTAFLUOROPROP-TETRAFLUOROETH EX AERO: 1.0000 | INHALATION_SPRAY | CUTANEOUS | Status: DC | PRN

## 2023-09-18 MED ORDER — OXYCODONE HCL 5 MG PO TABS
ORAL_TABLET | ORAL | Status: AC
  Filled 2023-09-18: qty 1

## 2023-09-18 NOTE — Plan of Care (Signed)
  Problem: Clinical Measurements: Goal: Diagnostic test results will improve Outcome: Progressing   Problem: Clinical Measurements: Goal: Will remain free from infection Outcome: Progressing   Problem: Education: Goal: Knowledge of General Education information will improve Description: Including pain rating scale, medication(s)/side effects and non-pharmacologic comfort measures Outcome: Progressing   Problem: Clinical Measurements: Goal: Respiratory complications will improve Outcome: Progressing   Problem: Coping: Goal: Level of anxiety will decrease Outcome: Progressing

## 2023-09-18 NOTE — Progress Notes (Signed)
 Informed that she is agreeable for HD now. Unable to take her at the moment, but will add her back to the list for today - will be tonight.  Izetta Boehringer, PA-C [facility] [employer] Pager [phone]

## 2023-09-18 NOTE — Progress Notes (Addendum)
 PT Cancellation Note  Patient Details Name: [name] MRN: [id number] DOB: 08/01/1960   Cancelled Treatment:    Reason Eval/Treat Not Completed: Medical issues which prohibited therapy. Hgb 6.8. Pt with order for 2 units PRBC. Pt declining PT eval due to blood transfusion getting ready to start.   Erven Sari Shaker 09/18/2023, 11:09 AM

## 2023-09-18 NOTE — Progress Notes (Signed)
 Patient's hgb 6.8. Mignon Bump MD notified.

## 2023-09-18 NOTE — Progress Notes (Signed)
 OT Cancellation Note  Patient Details Name: [name] MRN: [id number] DOB: [birth year]   Cancelled Treatment:    Reason Eval/Treat Not Completed: Medical issues which prohibited therapy;Patient at procedure or test/ unavailable. Per RN pt Hgb 6.8 and getting ready to receive 2 units of PRBCs. OT will follow up next available time as appropriate  Jacques Karna Loose 09/18/2023, 1:47 PM

## 2023-09-18 NOTE — Progress Notes (Signed)
 PROGRESS NOTE  [name] FMW:[id number] DOB: [birth year]   PCP: Vicci Barnie NOVAK, MD  Patient is from: Homeless.  DOA: 09/06/2023 LOS: 12  Chief complaints Chief Complaint  Patient presents with   Leg Pain   Chest Pain   Shortness of Breath     Brief Narrative / Interim history: 63 year old F with PMH of ESRD not on HD due to homelessness, polysubstance use including cocaine and tobacco, chronic GI bleed due to AVMs, PUD, COPD, HTN, GERD and chronic leg pain presented to ED for dialysis and found to have significant anemia with hemoglobin of 4.3.  BNP elevated to 1183.  Troponin elevated to 86.   Patient was transfused 3 units and hemoglobin improved to 8.9 before dropping to 6.5.  She was transfused 4 units and hemoglobin improved to 8.5.  Small bowel endoscopy showed 4 nonbleeding AVMs in the duodenum and 2 in the jejunum treated with APC (cautery).  GI recommended iron  supplementation and signed off.  Patient also had right pleural effusion for which she had thoracocentesis with removal of 700 cc transudative fluid on 6/30.   In regards to ESRD, nephrology consulted and following.  Now refusing HD.  Complicated disposition due to homelessness, ESRD and substance use.   Subjective: Seen and examined earlier this morning.  No major events overnight or this morning.  Hemoglobin dropped to 6.8.  Denies overt bleeding such as melena or hematochezia.  Patient refusing hemodialysis.  Objective: Vitals:   09/18/23 0518 09/18/23 0831 09/18/23 1335 09/18/23 1348  BP: 128/61 (!) 126/58 (!) 141/65 134/62  Pulse: 73 79 78 76  Resp: 16 17 16 16   Temp: 97.8 F (36.6 C) (!) 97.5 F (36.4 C) 98 F (36.7 C) 98 F (36.7 C)  TempSrc: Oral Oral Axillary Oral  SpO2: 94% 95%  93%  Weight:      Height:        Examination:  GENERAL: No apparent distress.  Nontoxic. HEENT: MMM.  Vision and hearing grossly intact.  NECK: Supple.  No apparent JVD.  RESP:  No IWOB.  Fair aeration  bilaterally. CVS:  RRR. Heart sounds normal.  ABD/GI/GU: BS+. Abd soft, NTND.  MSK/EXT:  Moves extremities. No apparent deformity. No edema.  SKIN: no apparent skin lesion or wound NEURO: AA.  Oriented appropriately.  No apparent focal neuro deficit. PSYCH: Calm. Normal affect.   Consultants:  Gastroenterology Nephrology  Procedures: 6/24-small bowel endoscopy 6/30-right thoracocentesis with removal of 700 cc fluid  Microbiology summarized: COVID-19, influenza and RSV PCR nonreactive  Assessment and plan: Acute on chronic blood loss anemia superimposed on anemia of renal disease: Small bowel endoscopy showed nonbleeding AVMs in duodenum and jejunum that were treated with APC.  Transfused a total of 4 units.  Hemoglobin down to 6.8 this morning without overt bleeding Recent Labs    09/07/23 0923 09/08/23 0644 09/09/23 0628 09/10/23 0542 09/10/23 2029 09/11/23 0646 09/12/23 0729 09/14/23 1044 09/15/23 0715 09/18/23 0653  HGB 8.9* 7.5* 7.3* 6.5* 9.3* 8.5* 8.5* 8.6* 7.2* 6.8*  -Transfuse 1 units -Continue Protonix  and Carafate  -Defer Aranesp  and IV iron  to nephrology  Duodenal/duodenal AVM: Nonbleeding AVMs noted on endoscopy.  Treated with APC. -Protonix  and Carafate  as above  ESRD not on HD due to homelessness. Also refusing dialysis. Bone mineral disorder -Per nephrology.  Chronic diastolic CHF: TTE in 01/2023 with LVEF of 50 to 55%.  Appears euvolemic on exam.  S/p right thoracocentesis with removal of 700 cc on 6/30. - Fluid management by  HD per nephrology  Right pleural effusion: Likely due to CHF and ESRD. -S/p right-sided thoracentesis on 6/30. -Fluid management by HD but refusing dialysis.  Acute respiratory failure with hypoxia: Due to COPD, CHF/effusion and ESRD.  Now on 2 L. -Wean oxygen as able -Incentive quality, OOB, PT/OT   Hyponatremia: Likely due to ESRD.  Improved. - Continue monitoring   Essential hypertension: Normotensive -Continue  amlodipine , carvedilol .   Chronic COPD -Continue inhalers.   Anxiety and depression: -Continue sertraline , trazodone .   Hyperlipidemia: -Continue Lipitor   Tobacco abuse:reports cutting down on smoking.   -Continue nicotine  patch   Polysubstance abuse: Continues to use cocaine.  Last use about 4 to 5 days before coming to ED. -Encouraged cessation - TOC consulted  Cystic lesion of left ear: Ongoing for 6 months. - Warm compress  Homelessness: Complicating care.  Body mass index is 22.1 kg/m.          DVT prophylaxis:  SCDs Start: 09/06/23 2047  Code Status: DNR Family Communication: None at bedside Level of care: Telemetry Medical Status is: Inpatient Remains inpatient appropriate because: Homelessness, anemia, ESRD   Final disposition: To be determined   35 minutes with more than 50% spent in reviewing records, counseling patient/family and coordinating care.   Sch Meds:  Scheduled Meds:  (feeding supplement) PROSource Plus  30 mL Oral BID BM   amLODipine   10 mg Oral Daily   atorvastatin   10 mg Oral Daily   budesonide -glycopyrrolate -formoterol   2 puff Inhalation BID   calcitRIOL   0.25 mcg Oral Daily   carvedilol   6.25 mg Oral BID WC   Chlorhexidine  Gluconate Cloth  6 each Topical Q0600   darbepoetin (ARANESP ) injection - DIALYSIS  150 mcg Subcutaneous Q Tue-1800   ferrous sulfate   325 mg Oral QODAY   folic acid   1 mg Oral Daily   gabapentin   100 mg Oral TID   heparin  sodium (porcine)  2,000 Units Intravenous Once   multivitamin with minerals  1 tablet Oral Daily   nicotine   21 mg Transdermal Daily   pantoprazole   40 mg Oral BID   sertraline   25 mg Oral Daily   sodium bicarbonate   650 mg Oral BID   sodium chloride  flush  3 mL Intravenous Q12H   sucralfate   1 g Oral QID   thiamine   100 mg Oral Daily   torsemide   100 mg Oral Daily   Continuous Infusions: PRN Meds:.acetaminophen  **OR** acetaminophen , acetaminophen , albuterol ,  guaiFENesin -dextromethorphan , metoprolol  tartrate, ondansetron  **OR** ondansetron  (ZOFRAN ) IV, oxyCODONE , polyethylene glycol, sodium chloride  flush, traZODone   Antimicrobials: Anti-infectives (From admission, onward)    None        I have personally reviewed the following labs and images: CBC: Recent Labs  Lab 09/12/23 0729 09/14/23 1044 09/15/23 0715 09/18/23 0653  WBC 5.6 8.2 6.7 5.0  HGB 8.5* 8.6* 7.2* 6.8*  HCT 26.2* 26.0* 22.1* 21.7*  MCV 94.2 95.6 94.4 94.8  PLT 266 270 252 297   BMP &GFR Recent Labs  Lab 09/14/23 1044 09/15/23 0715 09/16/23 0638 09/17/23 0639 09/18/23 0653  NA 123* 119* 117* 126* 124*  K 4.0 4.4 4.6 3.8 4.1  CL 86* 82* 85* 87* 87*  CO2 20* 21* 20* 26 26  GLUCOSE 120* 119* 115* 99 94  BUN 64* 70* 78* 39* 52*  CREATININE 4.95* 5.13* 5.42* 3.32* 4.09*  CALCIUM  7.7* 7.6* 7.1* 7.8* 8.2*  MG 1.9 2.1  --   --   --   PHOS 5.4* 6.6* 6.8* 4.8* 5.7*  Estimated Creatinine Clearance: 13.7 mL/min (A) (by C-G formula based on SCr of 4.09 mg/dL (H)). Liver & Pancreas: Recent Labs  Lab 09/16/23 9361 09/17/23 0639 09/18/23 0653  ALBUMIN  2.2* 2.1* 2.1*   No results for input(s): LIPASE, AMYLASE in the last 168 hours. No results for input(s): AMMONIA in the last 168 hours. Diabetic: No results for input(s): HGBA1C in the last 72 hours. Recent Labs  Lab 09/16/23 2246  GLUCAP 125*   Cardiac Enzymes: No results for input(s): CKTOTAL, CKMB, CKMBINDEX, TROPONINI in the last 168 hours. No results for input(s): PROBNP in the last 8760 hours. Coagulation Profile: No results for input(s): INR, PROTIME in the last 168 hours. Thyroid Function Tests: No results for input(s): TSH, T4TOTAL, FREET4, T3FREE, THYROIDAB in the last 72 hours. Lipid Profile: No results for input(s): CHOL, HDL, LDLCALC, TRIG, CHOLHDL, LDLDIRECT in the last 72 hours. Anemia Panel: No results for input(s): VITAMINB12, FOLATE,  FERRITIN, TIBC, IRON , RETICCTPCT in the last 72 hours. Urine analysis:    Component Value Date/Time   COLORURINE YELLOW 04/16/2023 0200   APPEARANCEUR HAZY (A) 04/16/2023 0200   LABSPEC 1.009 04/16/2023 0200   PHURINE 6.0 04/16/2023 0200   GLUCOSEU 50 (A) 04/16/2023 0200   HGBUR SMALL (A) 04/16/2023 0200   BILIRUBINUR NEGATIVE 04/16/2023 0200   KETONESUR NEGATIVE 04/16/2023 0200   PROTEINUR >=300 (A) 04/16/2023 0200   UROBILINOGEN 0.2 03/02/2010 2027   NITRITE NEGATIVE 04/16/2023 0200   LEUKOCYTESUR MODERATE (A) 04/16/2023 0200   Sepsis Labs: Invalid input(s): PROCALCITONIN, LACTICIDVEN  Microbiology: No results found for this or any previous visit (from the past 240 hours).  Radiology Studies: No results found.    Carmelle Bamberg T. [facility]  If 7PM-7AM, please contact night-coverage [email or web address] 09/18/2023, 2:26 PM

## 2023-09-18 NOTE — Progress Notes (Signed)
 [facility] Progress Note   Subjective:   Seen in room. She refused HD this AM. We discussed that would recommend HD b/c she is still requiring O2. She took the O2 off and says she doesn't need it. Denies CP or abdominal pain.  Objective Vitals:   09/17/23 2022 09/17/23 2033 09/18/23 0518 09/18/23 0831  BP: (!) 117/57  128/61 (!) 126/58  Pulse: 65 75 73 79  Resp: 18 20 16 17   Temp: (!) 97.5 F (36.4 C)  97.8 F (36.6 C) (!) 97.5 F (36.4 C)  TempSrc: Oral  Oral Oral  SpO2: 95% 96% 94% 95%  Weight:      Height:       Physical Exam General: Chronically ill appearing, NAD Heart: RRR Lungs: Scattered wheezing Extremities: No LE edema Dialysis Access:  L AVF +t/b  Additional Objective Labs: Basic Metabolic Panel: Recent Labs  Lab 09/16/23 0638 09/17/23 0639 09/18/23 0653  NA 117* 126* 124*  K 4.6 3.8 4.1  CL 85* 87* 87*  CO2 20* 26 26  GLUCOSE 115* 99 94  BUN 78* 39* 52*  CREATININE 5.42* 3.32* 4.09*  CALCIUM  7.1* 7.8* 8.2*  PHOS 6.8* 4.8* 5.7*   Liver Function Tests: Recent Labs  Lab 09/16/23 0638 09/17/23 0639 09/18/23 0653  ALBUMIN  2.2* 2.1* 2.1*   CBC: Recent Labs  Lab 09/12/23 0729 09/14/23 1044 09/15/23 0715 09/18/23 0653  WBC 5.6 8.2 6.7 5.0  HGB 8.5* 8.6* 7.2* 6.8*  HCT 26.2* 26.0* 22.1* 21.7*  MCV 94.2 95.6 94.4 94.8  PLT 266 270 252 297   Medications:   amLODipine   10 mg Oral Daily   atorvastatin   10 mg Oral Daily   budesonide -glycopyrrolate -formoterol   2 puff Inhalation BID   calcitRIOL   0.25 mcg Oral Daily   carvedilol   6.25 mg Oral BID WC   Chlorhexidine  Gluconate Cloth  6 each Topical Q0600   darbepoetin (ARANESP ) injection - DIALYSIS  150 mcg Subcutaneous Q Tue-1800   ferrous sulfate   325 mg Oral QODAY   folic acid   1 mg Oral Daily   gabapentin   100 mg Oral TID   heparin  sodium (porcine)  2,000 Units Intravenous Once   multivitamin with minerals  1 tablet Oral Daily   nicotine   21 mg Transdermal Daily    pantoprazole   40 mg Oral BID   sertraline   25 mg Oral Daily   sodium bicarbonate   650 mg Oral BID   sodium chloride  flush  3 mL Intravenous Q12H   sucralfate   1 g Oral QID   thiamine   100 mg Oral Daily   torsemide   100 mg Oral Daily    Dialysis Orders No outpatient unit.    Assessment/Plan: ESRD:  Does not have center, refuses regular HD. Does have some residual function. Last HD 4/10 before this admit. Was agreeable for HD here - started with low time/BFR on 6/23 and 6/25. Refused HD Sun 6/29.  Dyspnea improved s/p R thora 6/30 with off. She is experiencing homelessness and does not have transportation so cannot be placed in an outpatient dialysis clinic. Dialyzed 7/1, refused 7/3. Will keep offering. Was going to stop her bicarb, but will keep going since she is refusing regular HD. Hypokalemia. S/p KCl. Using higher K bath with HD, now resolved. Dyspnea: Pulm edema/effusions on CXR and with COPD/rhonchi as well. S/p R sided thora + volume removal with HD as tolerated. She removed her O2 this AM - will follow. Acute blood loss anemia.  Hgb  4.3 on admit. Hx of GIB. S/p 4 units PRBCs. 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 Continue Aranesp  150mcg q Tuesday and PO iron  for now. On sucralfate  - would limit this to 2 weeks.  Hypertension/volume: BP ok, meds resumed. Na low reflecting extra volume. Metabolic bone disease: CorrCa ok, Phos high - continue VDRA, not on binder. Nutrition: Alb low, adding supps while she is here. COPD: On Breztri . Per primary  Dispo. Per primary.    Izetta Boehringer, PA-C 09/18/2023, 12:34 PM  [facility]

## 2023-09-19 LAB — TYPE AND SCREEN
ABO/RH(D): A POS
Antibody Screen: NEGATIVE
Unit division: 0

## 2023-09-19 LAB — CBC
HCT: 24.3 % — ABNORMAL LOW (ref 36.0–46.0)
Hemoglobin: 7.9 g/dL — ABNORMAL LOW (ref 12.0–15.0)
MCH: 29.8 pg (ref 26.0–34.0)
MCHC: 32.5 g/dL (ref 30.0–36.0)
MCV: 91.7 fL (ref 80.0–100.0)
Platelets: 286 K/uL (ref 150–400)
RBC: 2.65 MIL/uL — ABNORMAL LOW (ref 3.87–5.11)
RDW: 18.6 % — ABNORMAL HIGH (ref 11.5–15.5)
WBC: 4.9 K/uL (ref 4.0–10.5)
nRBC: 0 % (ref 0.0–0.2)

## 2023-09-19 LAB — RENAL FUNCTION PANEL
Albumin: 2.1 g/dL — ABNORMAL LOW (ref 3.5–5.0)
Anion gap: 11 (ref 5–15)
BUN: 24 mg/dL — ABNORMAL HIGH (ref 8–23)
CO2: 28 mmol/L (ref 22–32)
Calcium: 8.3 mg/dL — ABNORMAL LOW (ref 8.9–10.3)
Chloride: 92 mmol/L — ABNORMAL LOW (ref 98–111)
Creatinine, Ser: 2.25 mg/dL — ABNORMAL HIGH (ref 0.44–1.00)
GFR, Estimated: 24 mL/min — ABNORMAL LOW (ref >60)
Glucose, Bld: 92 mg/dL (ref 70–99)
Phosphorus: 3.3 mg/dL (ref 2.5–4.6)
Potassium: 3.8 mmol/L (ref 3.5–5.1)
Sodium: 131 mmol/L — ABNORMAL LOW (ref 135–145)

## 2023-09-19 LAB — BPAM RBC
Blood Product Expiration Date: 202508032359
ISSUE DATE / TIME: 202507031326
Unit Type and Rh: 6200

## 2023-09-19 LAB — MAGNESIUM: Magnesium: 2 mg/dL (ref 1.7–2.4)

## 2023-09-19 MED ORDER — DARBEPOETIN ALFA 200 MCG/0.4ML IJ SOSY: 200.0000 ug | PREFILLED_SYRINGE | INTRAMUSCULAR | Status: DC

## 2023-09-19 NOTE — Progress Notes (Signed)
 [facility] Progress Note   Subjective: Seen in room. Actually looks much better than I've seen in some time. Says she wants to go to shelter in [place] but can't go because of Holidays. Denies SOB. Net UF 3.0 liters overnight.   Objective Vitals:   09/19/23 0156 09/19/23 0159 09/19/23 0211 09/19/23 0514  BP: 138/70  136/66 (!) 129/53  Pulse: 61 64 69 67  Resp: 13 12 17 16   Temp:   98.5 F (36.9 C) 98.4 F (36.9 C)  TempSrc:   Oral Oral  SpO2: 100% 100% 100% 98%  Weight:   72.2 kg   Height:         Additional Objective Labs: Basic Metabolic Panel: Recent Labs  Lab 09/17/23 0639 09/18/23 0653 09/19/23 0559  NA 126* 124* 131*  K 3.8 4.1 3.8  CL 87* 87* 92*  CO2 26 26 28   GLUCOSE 99 94 92  BUN 39* 52* 24*  CREATININE 3.32* 4.09* 2.25*  CALCIUM  7.8* 8.2* 8.3*  PHOS 4.8* 5.7* 3.3   Liver Function Tests: Recent Labs  Lab 09/17/23 0639 09/18/23 0653 09/19/23 0559  ALBUMIN  2.1* 2.1* 2.1*   No results for input(s): LIPASE, AMYLASE in the last 168 hours. CBC: Recent Labs  Lab 09/14/23 1044 09/15/23 0715 09/18/23 0653 09/19/23 0559  WBC 8.2 6.7 5.0 4.9  HGB 8.6* 7.2* 6.8* 7.9*  HCT 26.0* 22.1* 21.7* 24.3*  MCV 95.6 94.4 94.8 91.7  PLT 270 252 297 286   Blood Culture    Component Value Date/Time   SDES BLOOD LEFT UPPER ARM 06/24/2023 1641   SDES BLOOD SITE NOT SPECIFIED 06/24/2023 1641   SPECREQUEST  06/24/2023 1641    BOTTLES DRAWN AEROBIC AND ANAEROBIC Blood Culture adequate volume   SPECREQUEST  06/24/2023 1641    BOTTLES DRAWN AEROBIC AND ANAEROBIC Blood Culture results may not be optimal due to an inadequate volume of blood received in culture bottles   CULT  06/24/2023 1641    NO GROWTH 5 DAYS Performed at [facility], [address] [place], [place] [address]    CULT  06/24/2023 1641    NO GROWTH 5 DAYS Performed at [facility], [address], [place], [place] [address]    REPTSTATUS 06/29/2023 FINAL 06/24/2023  1641   REPTSTATUS 06/29/2023 FINAL 06/24/2023 1641    Cardiac Enzymes: No results for input(s): CKTOTAL, CKMB, CKMBINDEX, TROPONINI in the last 168 hours. CBG: Recent Labs  Lab 09/16/23 2246  GLUCAP 125*   Iron  Studies: No results for input(s): IRON , TIBC, TRANSFERRIN, FERRITIN in the last 72 hours. @lablastinr3 @ Studies/Results: No results found. Medications:   (feeding supplement) PROSource Plus  30 mL Oral BID BM   amLODipine   10 mg Oral Daily   atorvastatin   10 mg Oral Daily   budesonide -glycopyrrolate -formoterol   2 puff Inhalation BID   calcitRIOL   0.25 mcg Oral Daily   carvedilol   6.25 mg Oral BID WC   Chlorhexidine  Gluconate Cloth  6 each Topical Q0600   darbepoetin (ARANESP ) injection - DIALYSIS  150 mcg Subcutaneous Q Tue-1800   ferrous sulfate   325 mg Oral QODAY   folic acid   1 mg Oral Daily   gabapentin   100 mg Oral TID   heparin  sodium (porcine)  2,000 Units Intravenous Once   multivitamin with minerals  1 tablet Oral Daily   nicotine   21 mg Transdermal Daily   pantoprazole   40 mg Oral BID   sertraline   25 mg Oral Daily  sodium bicarbonate   650 mg Oral BID   sodium chloride  flush  3 mL Intravenous Q12H   sucralfate   1 g Oral QID   thiamine   100 mg Oral Daily   torsemide   100 mg Oral Daily   Physical Exam General: Chronically ill appearing female who looks older than stated age.  Heart:S1,S2 No M/R/G Lungs: CTAB no WOB Abdomen: NABS, NT Extremities: No LE edema Dialysis Access: AVF + T/B  No HD unit Homeless   Assessment/Plan: 1. ABLA superimposed on anemia of ESRD- S/P 4 units PRBCs since admission. GI consulted. Endoscopy 09/09/2023. Non-bleeding AVMS noted. Treated with APC.  HGB 7.9 today Noted to be on carafate . ESRD patients should not be on carafate  longer than 2 weeks D/T risk of aluminum toxicity. Per primary 2. ESRD - T,Th,S schedule if she will come to HD. No OP clinic. Only gets HD when in hospital. Continue NAHCO3 as  ordered. No heparin  with HD.  3. Anemia - As noted above. HGB 7.9. Increase ESA dose.  4. Secondary hyperparathyroidism - PO4/Corrected calcium  OK.  5. HTN/volume - BP and volume acceptable. Continue to UF as tolerated and when she agrees to go to HD.   6. Nutrition - add protein supplements.   Deontez Klinke H. Damone Fancher NP-C 09/19/2023, 11:11 AM  [employer] [phone]

## 2023-09-19 NOTE — TOC Progression Note (Signed)
 Transition of Care [facility]) - Progression Note    Patient Details  Name: [name] MRN: [id number] Date of Birth: Aug 10, 1960  Transition of Care [facility]) CM/SW Contact  Andrez JULIANNA George, RN Phone Number: 09/19/2023, 3:22 PM  Clinical Narrative:      Pt is homeless. CM contacted for home oxygen. Pt states she may go to a motel but not sure which one or if they will have a room. She doesn't have a phone but plans to get one through [facility]. CM is unable to arrange home oxygen with no address and no way to contact the patient. CM called 2 different DME companies and they do not provide oxygen to homeless pts and will not supply the purse oxygen. MD and bedside RN updated.   Expected Discharge Plan: Homeless Shelter Barriers to Discharge: Continued Medical Work up  Expected Discharge Plan and Services In-house Referral: Artist Discharge Planning Services: CM Consult Post Acute Care Choice: NA Living arrangements for the past 2 months: Homeless Expected Discharge Date: 09/19/23               DME Arranged: N/A DME Agency: NA       HH Arranged: NA HH Agency: NA         Social Determinants of Health (SDOH) Interventions SDOH Screenings   Food Insecurity: Food Insecurity Present (09/06/2023)  Housing: High Risk (09/09/2023)  Transportation Needs: Unmet Transportation Needs (09/06/2023)  Utilities: Not At Risk (09/06/2023)  Recent Concern: Utilities - At Risk (07/25/2023)  Alcohol  Screen: Low Risk  (02/10/2023)  Depression (PHQ2-9): Medium Risk (06/11/2023)  Financial Resource Strain: High Risk (02/10/2023)  Physical Activity: Inactive (02/10/2023)  Social Connections: Socially Isolated (06/12/2023)  Tobacco Use: High Risk (09/09/2023)  Health Literacy: Adequate Health Literacy (02/10/2023)    Readmission Risk Interventions    07/01/2023   12:39 PM 06/16/2023    4:04 PM 01/28/2023   10:32 AM  Readmission Risk Prevention Plan  Transportation Screening Complete Complete  Complete  Medication Review (RN Care Manager) Referral to Pharmacy Complete Complete  PCP or Specialist appointment within 3-5 days of discharge Complete Complete   HRI or Home Care Consult Complete Complete   SW Recovery Care/Counseling Consult Complete Complete   Palliative Care Screening Not Applicable Not Applicable   Skilled Nursing Facility Not Applicable Not Applicable

## 2023-09-19 NOTE — Plan of Care (Signed)
  Problem: Clinical Measurements: Goal: Respiratory complications will improve Outcome: Progressing   Problem: Coping: Goal: Level of anxiety will decrease Outcome: Progressing   Problem: Skin Integrity: Goal: Risk for impaired skin integrity will decrease Outcome: Progressing   

## 2023-09-19 NOTE — Progress Notes (Signed)
 PROGRESS NOTE  [name] FMW:[id number] DOB: 1960/11/18   PCP: Vicci Barnie NOVAK, MD  Patient is from: Homeless.  DOA: 09/06/2023 LOS: 13  Chief complaints Chief Complaint  Patient presents with   Leg Pain   Chest Pain   Shortness of Breath     Brief Narrative / Interim history: 63 year old F with PMH of ESRD not on HD due to homelessness, polysubstance use including cocaine and tobacco, chronic GI bleed due to AVMs, PUD, COPD, HTN, GERD and chronic leg pain presented to ED for dialysis and found to have significant anemia with hemoglobin of 4.3.  BNP elevated to 1183.  Troponin elevated to 86.   Patient was transfused 3 units and hemoglobin improved to 8.9 before dropping to 6.5.  She was transfused 4 units and hemoglobin improved to 8.5.  Small bowel endoscopy showed 4 nonbleeding AVMs in the duodenum and 2 in the jejunum treated with APC (cautery).  GI recommended iron  supplementation and signed off.  Patient also had right pleural effusion for which she had thoracocentesis with removal of 700 cc transudative fluid on 6/30.   In regards to ESRD, nephrology consulted and following.  Now refusing HD.  Complicated disposition due to homelessness, ESRD and substance use.   Subjective: Seen and examined earlier this morning.  No major events overnight or this morning.  Had dialysis with ultrafiltration of 3 L last night.  No complaints at the moment.  Likes to go home.   Objective: Vitals:   09/19/23 0156 09/19/23 0159 09/19/23 0211 09/19/23 0514  BP: 138/70  136/66 (!) 129/53  Pulse: 61 64 69 67  Resp: 13 12 17 16   Temp:   98.5 F (36.9 C) 98.4 F (36.9 C)  TempSrc:   Oral Oral  SpO2: 100% 100% 100% 98%  Weight:   72.2 kg   Height:        Examination:  GENERAL: No apparent distress.  Nontoxic. HEENT: MMM.  Vision and hearing grossly intact.  NECK: Supple.  No apparent JVD.  RESP:  No IWOB.  Fair aeration bilaterally. CVS:  RRR. Heart sounds normal.  ABD/GI/GU:  BS+. Abd soft, NTND.  MSK/EXT:  Moves extremities. No apparent deformity. No edema.  SKIN: no apparent skin lesion or wound NEURO: AA.  Oriented appropriately.  No apparent focal neuro deficit. PSYCH: Calm. Normal affect.   Consultants:  Gastroenterology Nephrology  Procedures: 6/24-small bowel endoscopy 6/30-right thoracocentesis with removal of 700 cc fluid  Microbiology summarized: COVID-19, influenza and RSV PCR nonreactive  Assessment and plan: Acute on chronic blood loss anemia superimposed on anemia of renal disease: Small bowel endoscopy showed nonbleeding AVMs in duodenum and jejunum that were treated with APC.  Transfused a total of 5 units so far. Recent Labs    09/08/23 0644 09/09/23 0628 09/10/23 0542 09/10/23 2029 09/11/23 0646 09/12/23 0729 09/14/23 1044 09/15/23 0715 09/18/23 0653 09/19/23 0559  HGB 7.5* 7.3* 6.5* 9.3* 8.5* 8.5* 8.6* 7.2* 6.8* 7.9*  -Monitor H&H. -Continue Protonix .  Carafate  discontinued due to concern for aluminum toxicity with prolonged use. -Defer Aranesp  and IV iron  to nephrology  Duodenal/duodenal AVM: Nonbleeding AVMs noted on endoscopy.  Treated with APC. -Protonix  and Carafate  as above  ESRD: Comes to ED sporadically for HD.  Had HD last night with ultrafiltration of 3 L. Bone mineral disorder -Per nephrology-not able to secure outpatient HD due to homelessness/lack of at risk  Chronic diastolic CHF: TTE in 01/2023 with LVEF of 50 to 55%.  Appears euvolemic on  exam.  S/p right thoracocentesis with removal of 700 cc on 6/30. - Fluid management by HD per nephrology  Right pleural effusion: Likely due to CHF and ESRD. -S/p right-sided thoracentesis on 6/30. -Fluid management by HD but refusing dialysis.  Acute respiratory failure with hypoxia: Due to COPD, CHF/effusion and ESRD.  Desaturated to 84% with ambulation on room air requiring 3 L to recover to 90%.  Unfortunately, not able to order or arrange home oxygen without  address. -Wean oxygen as able -Incentive quality, OOB, PT/OT   Hyponatremia: Likely due to ESRD.  Improved. - Continue monitoring   Essential hypertension: Normotensive -Continue amlodipine , carvedilol .   Chronic COPD -Continue inhalers.   Anxiety and depression: -Continue sertraline , trazodone .   Hyperlipidemia: -Continue Lipitor   Tobacco abuse:reports cutting down on smoking.   -Continue nicotine  patch   Polysubstance abuse: Continues to use cocaine.  Last use about 4 to 5 days before coming to ED. -Encouraged cessation - TOC consulted  Cystic lesion of left ear: Ongoing for 6 months. - Warm compress  Homelessness: Complicating care.  Body mass index is 24.93 kg/m.          DVT prophylaxis:  SCDs Start: 09/06/23 2047  Code Status: DNR Family Communication: None at bedside Level of care: Telemetry Medical Status is: Inpatient Remains inpatient appropriate because: Homelessness, anemia, ESRD   Final disposition: To be determined   35 minutes with more than 50% spent in reviewing records, counseling patient/family and coordinating care.   Sch Meds:  Scheduled Meds:  (feeding supplement) PROSource Plus  30 mL Oral BID BM   amLODipine   10 mg Oral Daily   atorvastatin   10 mg Oral Daily   budesonide -glycopyrrolate -formoterol   2 puff Inhalation BID   calcitRIOL   0.25 mcg Oral Daily   carvedilol   6.25 mg Oral BID WC   Chlorhexidine  Gluconate Cloth  6 each Topical Q0600   [START ON 09/24/2023] darbepoetin (ARANESP ) injection - DIALYSIS  200 mcg Subcutaneous Q Wed-1800   ferrous sulfate   325 mg Oral QODAY   folic acid   1 mg Oral Daily   gabapentin   100 mg Oral TID   heparin  sodium (porcine)  2,000 Units Intravenous Once   multivitamin with minerals  1 tablet Oral Daily   nicotine   21 mg Transdermal Daily   pantoprazole   40 mg Oral BID   sertraline   25 mg Oral Daily   sodium bicarbonate   650 mg Oral BID   sodium chloride  flush  3 mL Intravenous Q12H    sucralfate   1 g Oral QID   thiamine   100 mg Oral Daily   torsemide   100 mg Oral Daily   Continuous Infusions: PRN Meds:.acetaminophen  **OR** acetaminophen , acetaminophen , albuterol , guaiFENesin -dextromethorphan , metoprolol  tartrate, ondansetron  **OR** ondansetron  (ZOFRAN ) IV, oxyCODONE , polyethylene glycol, sodium chloride  flush, traZODone   Antimicrobials: Anti-infectives (From admission, onward)    None        I have personally reviewed the following labs and images: CBC: Recent Labs  Lab 09/14/23 1044 09/15/23 0715 09/18/23 0653 09/19/23 0559  WBC 8.2 6.7 5.0 4.9  HGB 8.6* 7.2* 6.8* 7.9*  HCT 26.0* 22.1* 21.7* 24.3*  MCV 95.6 94.4 94.8 91.7  PLT 270 252 297 286   BMP &GFR Recent Labs  Lab 09/14/23 1044 09/15/23 0715 09/16/23 0638 09/17/23 0639 09/18/23 0653 09/19/23 0559  NA 123* 119* 117* 126* 124* 131*  K 4.0 4.4 4.6 3.8 4.1 3.8  CL 86* 82* 85* 87* 87* 92*  CO2 20* 21* 20* 26 26 28  GLUCOSE 120* 119* 115* 99 94 92  BUN 64* 70* 78* 39* 52* 24*  CREATININE 4.95* 5.13* 5.42* 3.32* 4.09* 2.25*  CALCIUM  7.7* 7.6* 7.1* 7.8* 8.2* 8.3*  MG 1.9 2.1  --   --   --  2.0  PHOS 5.4* 6.6* 6.8* 4.8* 5.7* 3.3   Estimated Creatinine Clearance: 24.9 mL/min (A) (by C-G formula based on SCr of 2.25 mg/dL (H)). Liver & Pancreas: Recent Labs  Lab 09/16/23 9361 09/17/23 0639 09/18/23 0653 09/19/23 0559  ALBUMIN  2.2* 2.1* 2.1* 2.1*   No results for input(s): LIPASE, AMYLASE in the last 168 hours. No results for input(s): AMMONIA in the last 168 hours. Diabetic: No results for input(s): HGBA1C in the last 72 hours. Recent Labs  Lab 09/16/23 2246  GLUCAP 125*   Cardiac Enzymes: No results for input(s): CKTOTAL, CKMB, CKMBINDEX, TROPONINI in the last 168 hours. No results for input(s): PROBNP in the last 8760 hours. Coagulation Profile: No results for input(s): INR, PROTIME in the last 168 hours. Thyroid Function Tests: No results for input(s):  TSH, T4TOTAL, FREET4, T3FREE, THYROIDAB in the last 72 hours. Lipid Profile: No results for input(s): CHOL, HDL, LDLCALC, TRIG, CHOLHDL, LDLDIRECT in the last 72 hours. Anemia Panel: No results for input(s): VITAMINB12, FOLATE, FERRITIN, TIBC, IRON , RETICCTPCT in the last 72 hours. Urine analysis:    Component Value Date/Time   COLORURINE YELLOW 04/16/2023 0200   APPEARANCEUR HAZY (A) 04/16/2023 0200   LABSPEC 1.009 04/16/2023 0200   PHURINE 6.0 04/16/2023 0200   GLUCOSEU 50 (A) 04/16/2023 0200   HGBUR SMALL (A) 04/16/2023 0200   BILIRUBINUR NEGATIVE 04/16/2023 0200   KETONESUR NEGATIVE 04/16/2023 0200   PROTEINUR >=300 (A) 04/16/2023 0200   UROBILINOGEN 0.2 03/02/2010 2027   NITRITE NEGATIVE 04/16/2023 0200   LEUKOCYTESUR MODERATE (A) 04/16/2023 0200   Sepsis Labs: Invalid input(s): PROCALCITONIN, LACTICIDVEN  Microbiology: No results found for this or any previous visit (from the past 240 hours).  Radiology Studies: No results found.    Tiras Bianchini T. Kenley Rettinger [facility]  If 7PM-7AM, please contact night-coverage [email or web address] 09/19/2023, 3:21 PM

## 2023-09-19 NOTE — Evaluation (Signed)
 Occupational Therapy Evaluation Patient Details Name: [name] MRN: [id number] DOB: [birth year] Today's Date: 09/19/2023   History of Present Illness   63yo F admitted 09/06/23 with acute on chronic anemia and hx of gastric/small bowel AVMs/erosive gastritis 04/2023. Presented now with Hgb 4.3, addressed with PRBCs during her stay. PMH HTN, chronic LE pain, tobacco and multisubstance abuse, anxiety/depression, CHF, HLD, ESRD on HD, gastric AVM     Clinical Impressions [name] was evaluated s/p the above admission list. She is unhoused but indep at baseline. Upon evaluation, pt demonstrated mod I ability to complete mobility and ADLs. Pt was on RA upon arrival with SpO2 at 92%, with any exertion she was SOB but declined supplemental O2 or pulse ox check. Pt does not require further acute, or follow up OT services. Recommend discharge back to pt's environment with assist as needed. OT to sign off with appreciation of order, please re-consult if needed.       If plan is discharge home, recommend the following:   Help with stairs or ramp for entrance;Assistance with cooking/housework     Functional Status Assessment   Patient has had a recent decline in their functional status and demonstrates the ability to make significant improvements in function in a reasonable and predictable amount of time.     Equipment Recommendations   None recommended by OT      Precautions/Restrictions   Precautions Precautions: Fall Recall of Precautions/Restrictions: Impaired Precaution/Restrictions Comments: watch SPO2 Restrictions Weight Bearing Restrictions Per Provider Order: No     Mobility Bed Mobility Overal bed mobility: Independent            Transfers Overall transfer level: Modified independent            Balance Overall balance assessment: Mild deficits observed, not formally tested           ADL either performed or assessed with clinical judgement   ADL  Overall ADL's : Modified independent;At baseline             General ADL Comments: mild safety concerns noted, likely baseline. Pt declined use of supplamental O2     Vision Baseline Vision/History: 0 No visual deficits Vision Assessment?: No apparent visual deficits     Perception Perception: Within Functional Limits       Praxis Praxis: WFL       Pertinent Vitals/Pain Pain Assessment Pain Assessment: Faces Faces Pain Scale: Hurts a little bit Pain Location: generalized grimacing with movement Pain Descriptors / Indicators: Discomfort, Sore Pain Intervention(s): Limited activity within patient's tolerance, Monitored during session     Extremity/Trunk Assessment Upper Extremity Assessment Upper Extremity Assessment: Overall WFL for tasks assessed   Lower Extremity Assessment Lower Extremity Assessment: Defer to PT evaluation   Cervical / Trunk Assessment Cervical / Trunk Assessment: Kyphotic   Communication Communication Communication: No apparent difficulties   Cognition Arousal: Alert Behavior During Therapy: WFL for tasks assessed/performed, Impulsive Cognition: No family/caregiver present to determine baseline             OT - Cognition Comments: likely baseline, self-limiting, poor insight into health mgmt                 Following commands: Intact       Cueing  General Comments   Cueing Techniques: Gestural cues  declined supplamental O2 despite education           Home Living Family/patient expects to be discharged to:: Shelter/Homeless  Additional Comments: unclear what her DC plan/location is - pt saying she is going to stay with a friend or at a motel      Prior Functioning/Environment Prior Level of Function : Independent/Modified Independent             Mobility Comments: Ambulatory without AD, occasional use of SPC, denies falls ADLs Comments: independent    OT Problem List: Decreased  cognition;Decreased safety awareness;Decreased knowledge of precautions   OT Treatment/Interventions:        OT Goals(Current goals can be found in the care plan section)   Acute Rehab OT Goals Patient Stated Goal: to leave the hospital OT Goal Formulation: With patient Time For Goal Achievement: 09/19/23 Potential to Achieve Goals: Good   AM-PAC OT 6 Clicks Daily Activity     Outcome Measure Help from another person eating meals?: None Help from another person taking care of personal grooming?: None Help from another person toileting, which includes using toliet, bedpan, or urinal?: None Help from another person bathing (including washing, rinsing, drying)?: None Help from another person to put on and taking off regular upper body clothing?: None Help from another person to put on and taking off regular lower body clothing?: None 6 Click Score: 24   End of Session Nurse Communication: Mobility status  Activity Tolerance: Patient tolerated treatment well Patient left: in bed;with call bell/phone within reach  OT Visit Diagnosis: Other abnormalities of gait and mobility (R26.89);Unsteadiness on feet (R26.81);Muscle weakness (generalized) (M62.81);Pain                Time: 1055-1107 OT Time Calculation (min): 12 min Charges:  OT General Charges $OT Visit: 1 Visit OT Evaluation $OT Eval Low Complexity: 1 Low  Lucie Kendall, OTR/L Acute Rehabilitation Services Office [phone] Secure Chat Communication Preferred   Lucie JONETTA Kendall 09/19/2023, 1:06 PM

## 2023-09-19 NOTE — Progress Notes (Signed)
 Mobility Specialist Progress Note:    09/19/23 1454  Mobility  Activity Ambulated with assistance in hallway  Level of Assistance Contact guard assist, steadying assist  Assistive Device Cane  Distance Ambulated (ft) 120 ft  Activity Response Tolerated fair  Mobility Referral Yes  Mobility visit 1 Mobility  Mobility Specialist Start Time (ACUTE ONLY) 1454  Mobility Specialist Stop Time (ACUTE ONLY) 1456  Mobility Specialist Time Calculation (min) (ACUTE ONLY) 2 min   Pt agreeable to session initially. Pt attempted to ambulate on RA but desat to 84% before having to be put on 3LNC; improved to 90% SpO2. RN aware. Pt seemed to be grouchy and irritable according to RN. Pt left back in bed w/ all needs met.   Pre Mobility: SpO2 92% RA During Mobility: SpO2 84% RA     SpO2 90% 3L [employer] Mobility: SpO2 90% RA  Venetia Keel Mobility Specialist Please Neurosurgeon or Rehab Office at [phone]

## 2023-09-19 NOTE — Evaluation (Signed)
 Physical Therapy Evaluation Patient Details Name: [name] MRN: [id number] DOB: 08-02-1960 Today's Date: 09/19/2023  History of Present Illness  63yo F admitted 09/06/23 with acute on chronic anemia and hx of gastric/small bowel AVMs/erosive gastritis 04/2023. Presented now with Hgb 4.3, addressed with PRBCs during her stay. PMH HTN, chronic LE pain, tobacco and multisubstance abuse, anxiety/depression, CHF, HLD, ESRD on HD, gastric AVM  Clinical Impression   Pt received in bed, agreeable to PT this morning. She allowed me to check resting SPO2 on RA (90%), however after that during transfers and gait would not allow me to recheck her O2 despite some SOB and increased WOB. Mobility general appears to be at her baseline, she is well known to this PT dept. Continue to refuse me rechecking SPO2 even when we got back to her room but did request to put her O2 back on once back in bed. We will need to do a formal walking O2 check whenever she is agreeable, she is clearly likely desatting on RA. Left in bed with all needs met, will not pick her up for PT as mobility seems to be at her baseline but will have our mobility team work with her while she remains in house.         If plan is discharge home, recommend the following: Other (comment) (N/A)   Can travel by private vehicle        Equipment Recommendations None recommended by PT  Recommendations for Other Services       Functional Status Assessment Patient has not had a recent decline in their functional status     Precautions / Restrictions Precautions Precautions: Fall Recall of Precautions/Restrictions: Impaired Precaution/Restrictions Comments: watch SPO2 Restrictions Weight Bearing Restrictions Per Provider Order: No      Mobility  Bed Mobility Overal bed mobility: Independent                  Transfers Overall transfer level: Modified independent Equipment used: Straight cane                     Ambulation/Gait Ambulation/Gait assistance: Modified independent (Device/Increase time) Gait Distance (Feet): 150 Feet Assistive device: Straight cane (and hallway railing) Gait Pattern/deviations: Step-through pattern, Drifts right/left, Trunk flexed Gait velocity: decreased     General Gait Details: slow but steady with SPC in one had/rail in the other, mild unsteadiness noted with SPC only; increased WOB noted but she repeatedly refused PT to check SPO2 with pulse ox  Stairs            Wheelchair Mobility     Tilt Bed    Modified Rankin (Stroke Patients Only)       Balance Overall balance assessment: Mild deficits observed, not formally tested                                           Pertinent Vitals/Pain Pain Assessment Pain Assessment: Faces Faces Pain Scale: Hurts little more Pain Descriptors / Indicators: Discomfort, Sore Pain Intervention(s): Limited activity within patient's tolerance, Monitored during session    Home Living Family/patient expects to be discharged to:: Shelter/Homeless                   Additional Comments: unclear what her DC plan/location is    Prior Function Prior Level of Function : Independent/Modified Independent  Mobility Comments: Ambulatory without AD, occasional use of SPC, denies falls ADLs Comments: independent     Extremity/Trunk Assessment   Upper Extremity Assessment Upper Extremity Assessment: Overall WFL for tasks assessed    Lower Extremity Assessment Lower Extremity Assessment: Overall WFL for tasks assessed    Cervical / Trunk Assessment Cervical / Trunk Assessment: Kyphotic  Communication   Communication Communication: No apparent difficulties    Cognition Arousal: Alert Behavior During Therapy: WFL for tasks assessed/performed, Impulsive   PT - Cognitive impairments: No apparent impairments                                 Cueing        General Comments General comments (skin integrity, edema, etc.): refused pulse ox checks for O2 once out of the bed    Exercises     Assessment/Plan    PT Assessment Patient does not need any further PT services  PT Problem List         PT Treatment Interventions      PT Goals (Current goals can be found in the Care Plan section)  Acute Rehab PT Goals Patient Stated Goal: none stated PT Goal Formulation: With patient Time For Goal Achievement: 10/03/23 Potential to Achieve Goals: Good    Frequency       Co-evaluation               AM-PAC PT 6 Clicks Mobility  Outcome Measure Help needed turning from your back to your side while in a flat bed without using bedrails?: None Help needed moving from lying on your back to sitting on the side of a flat bed without using bedrails?: None Help needed moving to and from a bed to a chair (including a wheelchair)?: None Help needed standing up from a chair using your arms (e.g., wheelchair or bedside chair)?: None Help needed to walk in hospital room?: A Little Help needed climbing 3-5 steps with a railing? : A Little 6 Click Score: 22    End of Session   Activity Tolerance: Patient tolerated treatment well Patient left: in bed;with call bell/phone within reach Nurse Communication: Mobility status PT Visit Diagnosis: Muscle weakness (generalized) (M62.81)    Time: 9068-9045 PT Time Calculation (min) (ACUTE ONLY): 23 min   Charges:   PT Evaluation $PT Eval Moderate Complexity: 1 Mod PT Treatments $Gait Training: 8-22 mins PT General Charges $$ ACUTE PT VISIT: 1 Visit        Josette Rough, PT, DPT 09/19/23 10:03 AM

## 2023-09-19 NOTE — Progress Notes (Signed)
 Mobility Specialist Progress Note:  Nurse requested Mobility Specialist to perform oxygen saturation test with pt which includes removing pt from oxygen both at rest and while ambulating.  Below are the results from that testing.     Patient Saturations on Room Air at Rest = spO2 92%  Patient Saturations on Room Air while Ambulating = sp02 80% .  Rested and performed pursed lip breathing for 1 minute with sp02 at 84%.  Patient Saturations on 3 Liters of oxygen while Ambulating = sp02 90%  At end of testing pt left in room on 0  Liters of oxygen.  Reported results to nurse.   [name] Mobility Specialist Please Neurosurgeon or Rehab Office at [phone]

## 2023-09-19 NOTE — Progress Notes (Signed)
 Patient returned from hemodialysis unit. Accompanied by hemodialysis tech.

## 2023-09-19 NOTE — Progress Notes (Signed)
 HD Note:  Some information was entered later than the data was gathered due to patient care needs. The stated time with the data is accurate.  Received patient in bed to unit.   Alert and oriented.   Informed consent signed and in chart.   Access used: Right arm AVF Access issues: None  Patient tolerated treatment well.   TX duration: 3.5 hours  Alert, without acute distress.  Total UF removed: 3L as ordered  Hand-off given to patient's nurse.   Transported back to the room   Bobetta Lango RN [facility] Dialysis Unit.

## 2023-09-19 NOTE — Progress Notes (Signed)
 Patient left AMA. PIV removed. Patient took belongings and left.

## 2023-09-19 NOTE — Progress Notes (Signed)
 Patient went to dialysis accompanied by transport with oxygen at 4 liters per minute via nasal cannula.

## 2023-09-20 ENCOUNTER — Emergency Department (HOSPITAL_COMMUNITY)

## 2023-09-20 ENCOUNTER — Encounter (HOSPITAL_COMMUNITY): Payer: Self-pay | Admitting: Emergency Medicine

## 2023-09-20 ENCOUNTER — Inpatient Hospital Stay (HOSPITAL_COMMUNITY)
Admission: EM | Admit: 2023-09-20 | Discharge: 2023-09-27 | DRG: 291 | Discharge status: Against Medical Advice | Attending: Internal Medicine | Admitting: Internal Medicine

## 2023-09-20 ENCOUNTER — Other Ambulatory Visit: Payer: Self-pay

## 2023-09-20 DIAGNOSIS — D500 Iron deficiency anemia secondary to blood loss (chronic): Secondary | ICD-10-CM | POA: Diagnosis present

## 2023-09-20 DIAGNOSIS — Z5900 Homelessness unspecified: Secondary | ICD-10-CM | POA: Diagnosis not present

## 2023-09-20 DIAGNOSIS — Z79899 Other long term (current) drug therapy: Secondary | ICD-10-CM

## 2023-09-20 DIAGNOSIS — E872 Acidosis, unspecified: Secondary | ICD-10-CM | POA: Diagnosis not present

## 2023-09-20 DIAGNOSIS — E871 Hypo-osmolality and hyponatremia: Secondary | ICD-10-CM | POA: Diagnosis present

## 2023-09-20 DIAGNOSIS — J4489 Other specified chronic obstructive pulmonary disease: Secondary | ICD-10-CM | POA: Diagnosis present

## 2023-09-20 DIAGNOSIS — N186 End stage renal disease: Secondary | ICD-10-CM | POA: Diagnosis present

## 2023-09-20 DIAGNOSIS — J449 Chronic obstructive pulmonary disease, unspecified: Secondary | ICD-10-CM | POA: Diagnosis present

## 2023-09-20 DIAGNOSIS — J9601 Acute respiratory failure with hypoxia: Secondary | ICD-10-CM | POA: Diagnosis present

## 2023-09-20 DIAGNOSIS — I10 Essential (primary) hypertension: Secondary | ICD-10-CM | POA: Diagnosis present

## 2023-09-20 DIAGNOSIS — K219 Gastro-esophageal reflux disease without esophagitis: Secondary | ICD-10-CM | POA: Diagnosis present

## 2023-09-20 DIAGNOSIS — F17210 Nicotine dependence, cigarettes, uncomplicated: Secondary | ICD-10-CM | POA: Diagnosis present

## 2023-09-20 DIAGNOSIS — Z66 Do not resuscitate: Secondary | ICD-10-CM | POA: Diagnosis present

## 2023-09-20 DIAGNOSIS — F1410 Cocaine abuse, uncomplicated: Secondary | ICD-10-CM | POA: Diagnosis present

## 2023-09-20 DIAGNOSIS — E785 Hyperlipidemia, unspecified: Secondary | ICD-10-CM | POA: Diagnosis present

## 2023-09-20 DIAGNOSIS — J9621 Acute and chronic respiratory failure with hypoxia: Secondary | ICD-10-CM | POA: Diagnosis present

## 2023-09-20 DIAGNOSIS — F1910 Other psychoactive substance abuse, uncomplicated: Secondary | ICD-10-CM | POA: Diagnosis present

## 2023-09-20 DIAGNOSIS — I5033 Acute on chronic diastolic (congestive) heart failure: Secondary | ICD-10-CM | POA: Diagnosis present

## 2023-09-20 DIAGNOSIS — I132 Hypertensive heart and chronic kidney disease with heart failure and with stage 5 chronic kidney disease, or end stage renal disease: Secondary | ICD-10-CM | POA: Diagnosis present

## 2023-09-20 DIAGNOSIS — Z91158 Patient's noncompliance with renal dialysis for other reason: Secondary | ICD-10-CM

## 2023-09-20 DIAGNOSIS — J90 Pleural effusion, not elsewhere classified: Principal | ICD-10-CM | POA: Diagnosis present

## 2023-09-20 DIAGNOSIS — R0602 Shortness of breath: Secondary | ICD-10-CM | POA: Diagnosis present

## 2023-09-20 DIAGNOSIS — Z992 Dependence on renal dialysis: Secondary | ICD-10-CM | POA: Diagnosis not present

## 2023-09-20 DIAGNOSIS — D631 Anemia in chronic kidney disease: Secondary | ICD-10-CM | POA: Diagnosis present

## 2023-09-20 DIAGNOSIS — R188 Other ascites: Secondary | ICD-10-CM | POA: Diagnosis present

## 2023-09-20 DIAGNOSIS — N2581 Secondary hyperparathyroidism of renal origin: Secondary | ICD-10-CM | POA: Diagnosis present

## 2023-09-20 DIAGNOSIS — Z8616 Personal history of COVID-19: Secondary | ICD-10-CM

## 2023-09-20 DIAGNOSIS — F419 Anxiety disorder, unspecified: Secondary | ICD-10-CM | POA: Diagnosis present

## 2023-09-20 DIAGNOSIS — F32A Depression, unspecified: Secondary | ICD-10-CM | POA: Diagnosis present

## 2023-09-20 DIAGNOSIS — I5032 Chronic diastolic (congestive) heart failure: Secondary | ICD-10-CM | POA: Diagnosis present

## 2023-09-20 DIAGNOSIS — G8929 Other chronic pain: Secondary | ICD-10-CM | POA: Diagnosis present

## 2023-09-20 DIAGNOSIS — K31819 Angiodysplasia of stomach and duodenum without bleeding: Secondary | ICD-10-CM | POA: Diagnosis present

## 2023-09-20 DIAGNOSIS — Z7951 Long term (current) use of inhaled steroids: Secondary | ICD-10-CM

## 2023-09-20 DIAGNOSIS — E8770 Fluid overload, unspecified: Secondary | ICD-10-CM | POA: Diagnosis present

## 2023-09-20 DIAGNOSIS — J441 Chronic obstructive pulmonary disease with (acute) exacerbation: Secondary | ICD-10-CM

## 2023-09-20 DIAGNOSIS — E1122 Type 2 diabetes mellitus with diabetic chronic kidney disease: Secondary | ICD-10-CM | POA: Diagnosis present

## 2023-09-20 DIAGNOSIS — D638 Anemia in other chronic diseases classified elsewhere: Secondary | ICD-10-CM | POA: Diagnosis present

## 2023-09-20 DIAGNOSIS — K5520 Angiodysplasia of colon without hemorrhage: Secondary | ICD-10-CM | POA: Diagnosis present

## 2023-09-20 DIAGNOSIS — Z720 Tobacco use: Secondary | ICD-10-CM | POA: Diagnosis present

## 2023-09-20 LAB — CBC WITH DIFFERENTIAL/PLATELET
Abs Immature Granulocytes: 0.05 K/uL (ref 0.00–0.07)
Basophils Absolute: 0.1 K/uL (ref 0.0–0.1)
Basophils Relative: 1 %
Eosinophils Absolute: 0.2 K/uL (ref 0.0–0.5)
Eosinophils Relative: 2 %
HCT: 28.3 % — ABNORMAL LOW (ref 36.0–46.0)
Hemoglobin: 9 g/dL — ABNORMAL LOW (ref 12.0–15.0)
Immature Granulocytes: 1 %
Lymphocytes Relative: 10 %
Lymphs Abs: 0.8 K/uL (ref 0.7–4.0)
MCH: 29.2 pg (ref 26.0–34.0)
MCHC: 31.8 g/dL (ref 30.0–36.0)
MCV: 91.9 fL (ref 80.0–100.0)
Monocytes Absolute: 0.8 K/uL (ref 0.1–1.0)
Monocytes Relative: 11 %
Neutro Abs: 6 K/uL (ref 1.7–7.7)
Neutrophils Relative %: 75 %
Platelets: 369 K/uL (ref 150–400)
RBC: 3.08 MIL/uL — ABNORMAL LOW (ref 3.87–5.11)
RDW: 18.8 % — ABNORMAL HIGH (ref 11.5–15.5)
WBC: 7.9 K/uL (ref 4.0–10.5)
nRBC: 0 % (ref 0.0–0.2)

## 2023-09-20 LAB — BASIC METABOLIC PANEL WITH GFR
Anion gap: 13 (ref 5–15)
BUN: 37 mg/dL — ABNORMAL HIGH (ref 8–23)
CO2: 27 mmol/L (ref 22–32)
Calcium: 8.9 mg/dL (ref 8.9–10.3)
Chloride: 91 mmol/L — ABNORMAL LOW (ref 98–111)
Creatinine, Ser: 3.18 mg/dL — ABNORMAL HIGH (ref 0.44–1.00)
GFR, Estimated: 16 mL/min — ABNORMAL LOW (ref >60)
Glucose, Bld: 100 mg/dL — ABNORMAL HIGH (ref 70–99)
Potassium: 4 mmol/L (ref 3.5–5.1)
Sodium: 131 mmol/L — ABNORMAL LOW (ref 135–145)

## 2023-09-20 LAB — RAPID URINE DRUG SCREEN, HOSP PERFORMED
Amphetamines: NOT DETECTED
Barbiturates: NOT DETECTED
Benzodiazepines: NOT DETECTED
Cocaine: POSITIVE — AB
Opiates: NOT DETECTED
Tetrahydrocannabinol: NOT DETECTED

## 2023-09-20 LAB — URINALYSIS, ROUTINE W REFLEX MICROSCOPIC
Bilirubin Urine: NEGATIVE
Glucose, UA: NEGATIVE mg/dL
Ketones, ur: NEGATIVE mg/dL
Nitrite: NEGATIVE
Protein, ur: 100 mg/dL — AB
Specific Gravity, Urine: 1.005 (ref 1.005–1.030)
pH: 7 (ref 5.0–8.0)

## 2023-09-20 LAB — TYPE AND SCREEN
ABO/RH(D): A POS
Antibody Screen: NEGATIVE

## 2023-09-20 LAB — BRAIN NATRIURETIC PEPTIDE: B Natriuretic Peptide: 1321 pg/mL — ABNORMAL HIGH (ref 0.0–100.0)

## 2023-09-20 LAB — TROPONIN I (HIGH SENSITIVITY)
Troponin I (High Sensitivity): 15 ng/L (ref <18)
Troponin I (High Sensitivity): 15 ng/L (ref <18)

## 2023-09-20 LAB — HEPATITIS B SURFACE ANTIGEN: Hepatitis B Surface Ag: NONREACTIVE

## 2023-09-20 MED ORDER — PENTAFLUOROPROP-TETRAFLUOROETH EX AERO: 1.0000 | INHALATION_SPRAY | CUTANEOUS | Status: DC | PRN

## 2023-09-20 MED ORDER — OXYCODONE HCL 5 MG PO TABS
5.0000 mg | ORAL_TABLET | Freq: Once | ORAL | Status: AC | PRN
  Administered 2023-09-20: 5 mg via ORAL
  Filled 2023-09-20: qty 1

## 2023-09-20 MED ORDER — SERTRALINE HCL 50 MG PO TABS
25.0000 mg | ORAL_TABLET | Freq: Every day | ORAL | Status: DC
  Administered 2023-09-20 – 2023-09-27 (×8): 25 mg via ORAL
  Filled 2023-09-20 (×8): qty 1

## 2023-09-20 MED ORDER — ACETAMINOPHEN 325 MG PO TABS
650.0000 mg | ORAL_TABLET | Freq: Four times a day (QID) | ORAL | Status: DC | PRN
  Administered 2023-09-23: 650 mg via ORAL
  Filled 2023-09-20: qty 2

## 2023-09-20 MED ORDER — ACETAMINOPHEN 650 MG RE SUPP: 650.0000 mg | Freq: Four times a day (QID) | RECTAL | Status: DC | PRN

## 2023-09-20 MED ORDER — NEPRO/CARBSTEADY PO LIQD
237.0000 mL | Freq: Two times a day (BID) | ORAL | Status: DC
  Administered 2023-09-21 – 2023-09-27 (×8): 237 mL via ORAL
  Filled 2023-09-20: qty 237

## 2023-09-20 MED ORDER — NALOXONE HCL 0.4 MG/ML IJ SOLN: 0.4000 mg | INTRAMUSCULAR | Status: DC | PRN

## 2023-09-20 MED ORDER — ALBUTEROL SULFATE (2.5 MG/3ML) 0.083% IN NEBU
2.5000 mg | INHALATION_SOLUTION | RESPIRATORY_TRACT | Status: DC | PRN
  Administered 2023-09-20 (×2): 2.5 mg via RESPIRATORY_TRACT

## 2023-09-20 MED ORDER — ANTICOAGULANT SODIUM CITRATE 4% (200MG/5ML) IV SOLN
5.0000 mL | Status: DC | PRN
  Filled 2023-09-20: qty 5

## 2023-09-20 MED ORDER — LIDOCAINE-PRILOCAINE 2.5-2.5 % EX CREA: 1.0000 | TOPICAL_CREAM | CUTANEOUS | Status: DC | PRN

## 2023-09-20 MED ORDER — SODIUM CHLORIDE 0.9% FLUSH
3.0000 mL | Freq: Two times a day (BID) | INTRAVENOUS | Status: DC
  Administered 2023-09-22 – 2023-09-27 (×9): 3 mL via INTRAVENOUS

## 2023-09-20 MED ORDER — RENA-VITE PO TABS
1.0000 | ORAL_TABLET | Freq: Every day | ORAL | Status: DC
  Administered 2023-09-20 – 2023-09-26 (×7): 1 via ORAL
  Filled 2023-09-20 (×8): qty 1

## 2023-09-20 MED ORDER — ATORVASTATIN CALCIUM 10 MG PO TABS
10.0000 mg | ORAL_TABLET | Freq: Every day | ORAL | Status: DC
  Administered 2023-09-20 – 2023-09-27 (×8): 10 mg via ORAL
  Filled 2023-09-20 (×8): qty 1

## 2023-09-20 MED ORDER — PANTOPRAZOLE SODIUM 40 MG PO TBEC
40.0000 mg | DELAYED_RELEASE_TABLET | Freq: Two times a day (BID) | ORAL | Status: DC
  Administered 2023-09-20 – 2023-09-27 (×14): 40 mg via ORAL
  Filled 2023-09-20 (×14): qty 1

## 2023-09-20 MED ORDER — GABAPENTIN 100 MG PO CAPS
100.0000 mg | ORAL_CAPSULE | Freq: Three times a day (TID) | ORAL | Status: DC
  Administered 2023-09-20 – 2023-09-27 (×19): 100 mg via ORAL
  Filled 2023-09-20 (×19): qty 1

## 2023-09-20 MED ORDER — PROSOURCE PLUS PO LIQD
30.0000 mL | Freq: Two times a day (BID) | ORAL | Status: DC
  Administered 2023-09-21 – 2023-09-27 (×8): 30 mL via ORAL
  Filled 2023-09-20 (×10): qty 30

## 2023-09-20 MED ORDER — BUDESON-GLYCOPYRROL-FORMOTEROL 160-9-4.8 MCG/ACT IN AERO
2.0000 | INHALATION_SPRAY | Freq: Two times a day (BID) | RESPIRATORY_TRACT | Status: DC
  Administered 2023-09-20 – 2023-09-27 (×12): 2 via RESPIRATORY_TRACT
  Filled 2023-09-20 (×2): qty 5.9

## 2023-09-20 MED ORDER — CALCITRIOL 0.25 MCG PO CAPS
0.2500 ug | ORAL_CAPSULE | Freq: Every day | ORAL | Status: DC
  Administered 2023-09-20 – 2023-09-27 (×8): 0.25 ug via ORAL
  Filled 2023-09-20 (×8): qty 1

## 2023-09-20 MED ORDER — TRAZODONE HCL 50 MG PO TABS
50.0000 mg | ORAL_TABLET | Freq: Every evening | ORAL | Status: DC | PRN
  Administered 2023-09-21 – 2023-09-26 (×4): 50 mg via ORAL
  Filled 2023-09-20 (×6): qty 1

## 2023-09-20 MED ORDER — NICOTINE 21 MG/24HR TD PT24
21.0000 mg | MEDICATED_PATCH | Freq: Every day | TRANSDERMAL | Status: DC
  Administered 2023-09-20 – 2023-09-27 (×7): 21 mg via TRANSDERMAL
  Filled 2023-09-20 (×7): qty 1

## 2023-09-20 MED ORDER — FUROSEMIDE 10 MG/ML IJ SOLN
40.0000 mg | Freq: Once | INTRAMUSCULAR | Status: AC
  Administered 2023-09-20: 40 mg via INTRAVENOUS
  Filled 2023-09-20: qty 4

## 2023-09-20 MED ORDER — ALBUTEROL SULFATE (2.5 MG/3ML) 0.083% IN NEBU
INHALATION_SOLUTION | RESPIRATORY_TRACT | Status: AC
  Filled 2023-09-20: qty 3

## 2023-09-20 MED ORDER — ACETAMINOPHEN 325 MG PO TABS
ORAL_TABLET | ORAL | Status: AC
  Filled 2023-09-20: qty 2

## 2023-09-20 MED ORDER — LIDOCAINE HCL (PF) 1 % IJ SOLN: 5.0000 mL | INTRAMUSCULAR | Status: DC | PRN

## 2023-09-20 MED ORDER — SODIUM BICARBONATE 650 MG PO TABS
1300.0000 mg | ORAL_TABLET | Freq: Two times a day (BID) | ORAL | Status: DC
  Administered 2023-09-20 – 2023-09-27 (×14): 1300 mg via ORAL
  Filled 2023-09-20 (×15): qty 2

## 2023-09-20 MED ORDER — FERROUS SULFATE 325 (65 FE) MG PO TABS
325.0000 mg | ORAL_TABLET | Freq: Every day | ORAL | Status: DC
  Administered 2023-09-21 – 2023-09-27 (×7): 325 mg via ORAL
  Filled 2023-09-20 (×7): qty 1

## 2023-09-20 MED ORDER — CHLORHEXIDINE GLUCONATE CLOTH 2 % EX PADS
6.0000 | MEDICATED_PAD | Freq: Every day | CUTANEOUS | Status: DC
  Administered 2023-09-21 – 2023-09-22 (×2): 6 via TOPICAL

## 2023-09-20 MED ORDER — LIDOCAINE 5 % EX PTCH
1.0000 | MEDICATED_PATCH | CUTANEOUS | Status: DC
  Administered 2023-09-20 – 2023-09-26 (×7): 1 via TRANSDERMAL
  Filled 2023-09-20 (×7): qty 1

## 2023-09-20 MED ORDER — ALTEPLASE 2 MG IJ SOLR: 2.0000 mg | Freq: Once | INTRAMUSCULAR | Status: DC | PRN

## 2023-09-20 MED ORDER — HEPARIN SODIUM (PORCINE) 1000 UNIT/ML DIALYSIS
1000.0000 [IU] | INTRAMUSCULAR | Status: DC | PRN
Start: 2023-09-20 — End: 2023-09-20

## 2023-09-20 MED ORDER — CARVEDILOL 6.25 MG PO TABS
6.2500 mg | ORAL_TABLET | Freq: Two times a day (BID) | ORAL | Status: DC
  Administered 2023-09-20 – 2023-09-27 (×13): 6.25 mg via ORAL
  Filled 2023-09-20 (×13): qty 1

## 2023-09-20 MED ORDER — TORSEMIDE 20 MG PO TABS
100.0000 mg | ORAL_TABLET | Freq: Every day | ORAL | Status: DC
  Administered 2023-09-21 – 2023-09-27 (×7): 100 mg via ORAL
  Filled 2023-09-20 (×2): qty 5
  Filled 2023-09-20: qty 1
  Filled 2023-09-20 (×2): qty 5
  Filled 2023-09-20 (×2): qty 1
  Filled 2023-09-20: qty 5

## 2023-09-20 MED ORDER — HEPARIN SODIUM (PORCINE) 5000 UNIT/ML IJ SOLN
5000.0000 [IU] | Freq: Three times a day (TID) | INTRAMUSCULAR | Status: DC
  Administered 2023-09-20 – 2023-09-27 (×11): 5000 [IU] via SUBCUTANEOUS
  Filled 2023-09-20 (×15): qty 1

## 2023-09-20 NOTE — Progress Notes (Signed)
 RT called to bedside by the dialysis RN to assess pt for BIPAP. Pt recently received breathing tx by the RN and not in any respiratory distress during assessment. Pt was yelling in bed that she has a migraine and is strongly refusing. RN at bedside to witness conversation.

## 2023-09-20 NOTE — ED Notes (Signed)
 X-ray at bedside, CCMD called

## 2023-09-20 NOTE — ED Provider Notes (Signed)
 [place] EMERGENCY DEPARTMENT AT [facility] Provider Note   CSN: [id number] Arrival date & time: 09/20/23  9242     Patient presents with: Shortness of Breath   [name] is a 63 y.o. female.   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  Nephrology unable to perform outpatient dialysis due to noncompliance.  Pt signed out AMA yesterday after an admission for a GI bleed.  Pt received several units of blood during admission and GI treated her AVMs with cautery.  Pt had a right pleural effusion for which a thoracentesis was done on 6/30.  Last had dialysis on 7/1 as she refused it yesterday.  She was on 3L oxygen and desaturated to 84% with ambulation.  Case management tried to get home O2, but were unable to do so at the time of AMA because there was no address to deliver the oxygen tanks.  EMS said pt has been sob since last night.  She is unable to give much hx due to sob.  EMS said she was hypoxic, working hard to breathe and full of rales, so they put her on CPAP and she has improved some.          Prior to Admission medications   Medication Sig Start Date End Date Taking? Authorizing Provider  acetaminophen  (TYLENOL ) 325 MG tablet Take 2 tablets (650 mg total) by mouth every 6 (six) hours as needed for mild pain (pain score 1-3) (or Fever >/= 101). 07/01/23   Danton Reyes DASEN, MD  albuterol  (VENTOLIN  HFA) 108 [phone] Base) MCG/ACT inhaler Inhale 2 puffs into the lungs every 6 (six) hours as needed for wheezing or shortness of breath. 07/26/23   Gherghe, Costin M, MD  amLODipine  (NORVASC ) 10 MG tablet Take 1 tablet (10 mg total) by mouth daily. 07/26/23   Gherghe, Costin M, MD  atorvastatin  (LIPITOR) 10 MG tablet Take 1 tablet (10 mg total) by mouth daily. 07/26/23   Gherghe, Costin M, MD  calcitRIOL  (ROCALTROL ) 0.25 MCG capsule Take 1 capsule (0.25 mcg total) by mouth  daily. 07/26/23   Gherghe, Costin M, MD  carvedilol  (COREG ) 6.25 MG tablet Take 1 tablet (6.25 mg total) by mouth 2 (two) times daily with a meal. 07/26/23   Gherghe, Nilda HERO, MD  ferrous sulfate  325 (65 FE) MG tablet Take 1 tablet (325 mg total) by mouth daily with breakfast. 07/26/23   Gherghe, Costin M, MD  gabapentin  (NEURONTIN ) 100 MG capsule Take 1 capsule (100 mg total) by mouth 3 (three) times daily. 07/26/23 10/24/23  Gherghe, Costin M, MD  nicotine  (NICODERM CQ  - DOSED IN MG/24 HOURS) 21 mg/24hr patch Place 1 patch (21 mg total) onto the skin daily. 07/27/23   Gherghe, Costin M, MD  oxyCODONE  (OXY IR/ROXICODONE ) 5 MG immediate release tablet Take 1 tablet (5 mg total) by mouth every 4 (four) hours as needed for severe pain (pain score 7-10). 07/26/23   Gherghe, Costin M, MD  pantoprazole  (PROTONIX ) 40 MG tablet Take 1 tablet (40 mg total) by mouth 2 (two) times daily. 07/26/23 08/25/23  Gherghe, Costin M, MD  sertraline  (ZOLOFT ) 25 MG tablet Take 1 tablet (25 mg total) by mouth daily. 07/26/23   Gherghe, Costin M, MD  sodium bicarbonate  650 MG tablet Take 2 tablets (1,300 mg total) by mouth 2 (two) times daily. 07/26/23 10/24/23  Trixie Nilda HERO, MD  torsemide  (DEMADEX ) 100 MG tablet Take 1 tablet (100 mg total) by mouth daily. 07/26/23   Gherghe, Costin M, MD  traZODone  (DESYREL ) 50 MG tablet Take 1 tablet (50 mg total) by mouth at bedtime as needed for sleep. 07/26/23   Trixie Nilda HERO, MD  TRELEGY ELLIPTA  100-62.5-25 MCG/ACT AEPB Inhale 1 puff into the lungs daily. 07/26/23   Gherghe, Costin M, MD    Allergies: Zestril  [lisinopril ]    Review of Systems  Respiratory:  Positive for shortness of breath.   All other systems reviewed and are negative.   Updated Vital Signs BP (!) 146/64   Pulse 74   Temp (!) 97.3 F (36.3 C) (Axillary)   Resp 20   SpO2 100%   Physical Exam Vitals and nursing note reviewed.  Constitutional:      General: She is in acute distress.     Appearance: She is  ill-appearing.  HENT:     Head: Normocephalic and atraumatic.     Mouth/Throat:     Mouth: Mucous membranes are dry.  Eyes:     Extraocular Movements: Extraocular movements intact.     Pupils: Pupils are equal, round, and reactive to light.  Cardiovascular:     Rate and Rhythm: Normal rate and regular rhythm.  Pulmonary:     Effort: Tachypnea and respiratory distress present.     Breath sounds: Rhonchi and rales present.  Abdominal:     General: Bowel sounds are normal.     Palpations: Abdomen is soft.  Musculoskeletal:        General: Normal range of motion.     Cervical back: Normal range of motion and neck supple.  Skin:    General: Skin is warm.     Capillary Refill: Capillary refill takes less than 2 seconds.  Neurological:     General: No focal deficit present.     Mental Status: She is alert.     Comments: Pt is tired, but responsive.     (all labs ordered are listed, but only abnormal results are displayed) Labs Reviewed  BASIC METABOLIC PANEL WITH GFR - Abnormal; Notable for the following components:      Result Value   Sodium 131 (*)    Chloride 91 (*)    Glucose, Bld 100 (*)    BUN 37 (*)    Creatinine, Ser 3.18 (*)    GFR, Estimated 16 (*)    All other components within normal limits  CBC WITH DIFFERENTIAL/PLATELET - Abnormal; Notable for the following components:   RBC 3.08 (*)    Hemoglobin 9.0 (*)    HCT 28.3 (*)    RDW 18.8 (*)    All other components within normal limits  BRAIN NATRIURETIC PEPTIDE - Abnormal; Notable for the following components:   B Natriuretic Peptide 1,321.0 (*)    All other components within normal limits  RAPID URINE DRUG SCREEN, HOSP PERFORMED  URINALYSIS, ROUTINE W REFLEX MICROSCOPIC  TYPE AND SCREEN  TROPONIN I (HIGH SENSITIVITY)  TROPONIN I (HIGH SENSITIVITY)    EKG: EKG Interpretation Date/Time:  Saturday September 20 2023 08:05:01 EDT Ventricular Rate:  75 PR Interval:  151 QRS Duration:  96 QT Interval:  423 QTC  Calculation: 473 R Axis:   46  Text Interpretation: Sinus rhythm No significant change since last tracing Confirmed by Dean Clarity [phone]) on 09/20/2023 8:26:00 AM  Radiology: [facility] Chest Port 1 View Result Date: 09/20/2023 CLINICAL DATA:  63 year old female with shortness of breath, dialysis patient,  swelling. Status post ultrasound-guided right side thoracentesis 09/15/2023. EXAM: PORTABLE CHEST 1 VIEW COMPARISON:  Portable chest 09/15/2023 and earlier. FINDINGS: Portable AP semi upright view at 0827 hours. Ongoing veiling and confluent opacity at the right lung base, where the peripheral and lateral costophrenic angle veiling opacity appears increased. Stable cardiomegaly and mediastinal contours. Stable associated right hemithorax volume loss. Interval increased veiling and confluent opacity at the left lung base. No pneumothorax. Stable pulmonary vascularity elsewhere. Visualized tracheal air column is within normal limits. No acute osseous abnormality identified. Paucity of bowel gas. IMPRESSION: 1. Increased small left and similarly re-accumulated right pleural effusions since the post thoracentesis x-ray 09/15/2023. 2. Otherwise stable with cardiomegaly, right lung atelectasis. No overt edema. Electronically Signed   By: VEAR Hurst M.D.   On: 09/20/2023 08:41     Procedures   Medications Ordered in the ED  furosemide  (LASIX ) injection 40 mg (40 mg Intravenous Given 09/20/23 0836)                                    Medical Decision Making Amount and/or Complexity of Data Reviewed Labs: ordered. Radiology: ordered.  Risk Prescription drug management. Decision regarding hospitalization.   This patient presents to the ED for concern of sob, this involves an extensive number of treatment options, and is a complaint that carries with it a high risk of complications and morbidity.  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  The pertinent results include:  cbc with hgb 9.0 (7.9 yest); bmp with bun 37 and cr 3.18; bnp elevated at 1321   Imaging Studies ordered:  I ordered imaging studies including cxr  I independently visualized and interpreted imaging which showed  . Increased small left and similarly re-accumulated right pleural  effusions since the post thoracentesis x-ray 09/15/2023.  2. Otherwise stable with cardiomegaly, right lung atelectasis. No  overt edema.   I agree with the radiologist interpretation   Cardiac Monitoring:  The patient was maintained on a cardiac monitor.  I personally viewed and interpreted the cardiac monitored which showed an underlying rhythm of: nsr   Medicines ordered and prescription drug management:  I ordered medication including lasix   for sx  Reevaluation of the patient after these medicines showed that the patient improved I have reviewed the patients home medicines and have made adjustments as needed   Critical Interventions:  bipap   Consultations Obtained:  I requested consultation with the nephrologist (Dr. Dolan),  and discussed lab and imaging findings as well as pertinent plan - he will put her on the list for dialysis Pt d/w Dr. FABIENE Sharps (triad ) for admission.  Problem List / ED Course:  Resp failure:  pt switched to Bipap and resp status has improved.  She looks more comfortable on the bipap.   Fluid overload:  nephrology consulted.  At this moment, pt is agreeable to dialysis Anemia:  improved from yesterday   Reevaluation:  After the interventions noted above, I reevaluated the  patient and found that they have :improved   Social  Determinants of Health:  homeless   Dispostion:  After consideration of the diagnostic results and the patients response to treatment, I feel that the patent would benefit from admission.  CRITICAL CARE Performed by: Mliss Boyers   Total critical care time: 30 minutes  Critical care time was exclusive of separately billable procedures and treating other patients.  Critical care was necessary to treat or prevent imminent or life-threatening deterioration.  Critical care was time spent personally by me on the following activities: development of treatment plan with patient and/or surrogate as well as nursing, discussions with consultants, evaluation of patient's response to treatment, examination of patient, obtaining history from patient or surrogate, ordering and performing treatments and interventions, ordering and review of laboratory studies, ordering and review of radiographic studies, pulse oximetry and re-evaluation of patient's condition.        Final diagnoses:  Pleural effusion on right  ESRD on hemodialysis [facility])  Acute respiratory failure with hypoxia [facility])  Homeless    ED Discharge Orders     None          Boyers Mliss, MD 09/20/23 1020

## 2023-09-20 NOTE — Progress Notes (Signed)
 Received patient in bed to unit.  Alert and oriented.  Informed consent signed and in chart.   TX duration:3.5 hours  Patient tolerated well.  Transported back to the room  Alert, without acute distress.  Hand-off given to patient's nurse.   Access used: R AV Fistula Access issues: Retrograde stick  Total UF removed: 4L Medication(s) given: Albuterol  twixe, Breztri , patient wanted IV out   09/20/23 1830  Vitals  Temp 98.2 F (36.8 C)  Temp Source Oral  BP (!) 130/57  MAP (mmHg) 77  Pulse Rate 64  ECG Heart Rate 64  Resp 14  Weight  (ER stretcher UTA)  Type of Weight Post-Dialysis  Oxygen Therapy  SpO2 100 %  O2 Device Nasal Cannula  O2 Flow Rate (L/min) 4 L/min  During Treatment Monitoring  Duration of HD Treatment -hour(s) 3.5 hour(s)  HD Safety Checks Performed Yes  Intra-Hemodialysis Comments Tx completed  Dialysis Fluid Bolus Normal Saline  Bolus Amount (mL) 300 mL  Post Treatment  Dialyzer Clearance Clear  Liters Processed 66.7  Fluid Removed (mL) 4000 mL  Tolerated HD Treatment Yes  AVG/AVF Arterial Site Held (minutes) 10 minutes  AVG/AVF Venous Site Held (minutes) 10 minutes  Fistula / Graft Right Upper arm Arteriovenous fistula  Placement Date/Time: 08/14/22 1230   Orientation: Right  Access Location: (c) Upper arm  Access Type: Arteriovenous fistula  Status Deaccessed     [name] LPN [facility]

## 2023-09-20 NOTE — ED Triage Notes (Signed)
 PT BIB [facility] on CPAP with reports of SHOB and swelling. Pt reports last dialysis was Saturday 6/28.

## 2023-09-20 NOTE — ED Notes (Signed)
 Nephrology at bedside, pt refusing dialysis

## 2023-09-20 NOTE — Progress Notes (Addendum)
 [facility] Progress Note   Background:  [name] is a 63 Y/O female who was admitted to [facility] 09/06/2023 for acute blood loss anemia, volume overload. PMH: ESRD patient who is homeless, does not have OP HD clinic, H/O polysubstance abuse. GI consulted. Endoscopy 09/09/2023. Non-bleeding AVMS noted. Treated with APC Last HD 09/18/2023. She left hospital [facility] 09/19/2023. She returned to ED this AM with C/O SOB. CXR showed increased small left and similarly re-accumulated right pleural effusions since the post thoracentesis x-ray 09/15/2023. NA-131 SCr 3.18 BUN 37 K+ 4.0 CO2 27. Initially she was placed on BIPAP but she refused. She is currently on O2 via [place] in ED awaiting dialysis. She has been readmitted for AHRF 2/2 volume overload, R pleural effusion. We have written HD orders and transport is in route to pick up patient now.    Subjective: Patient says she is SOB and she doesn't know why they can't get this fluid off of me. Attempted to educate patient that she needs dialysis three times weekly. Says she will never go to dialysis three times a week, I don't live my life that way and besides it don't do no good anyway.      Objective Vitals:   09/20/23 1130 09/20/23 1145 09/20/23 1200 09/20/23 1213  BP: 133/62 (!) 127/55 (!) 128/59   Pulse: 67 70 65   Resp: 15 15 13    Temp:    97.6 F (36.4 C)  TempSrc:    Axillary  SpO2: 100% 100% 100%     Additional Objective Labs: Basic Metabolic Panel: Recent Labs  Lab 09/17/23 0639 09/18/23 0653 09/19/23 0559 09/20/23 0804  NA 126* 124* 131* 131*  K 3.8 4.1 3.8 4.0  CL 87* 87* 92* 91*  CO2 26 26 28 27   GLUCOSE 99 94 92 100*  BUN 39* 52* 24* 37*  CREATININE 3.32* 4.09* 2.25* 3.18*  CALCIUM  7.8* 8.2* 8.3* 8.9  PHOS 4.8* 5.7* 3.3  --    Liver Function Tests: Recent Labs  Lab 09/17/23 0639 09/18/23 0653 09/19/23 0559  ALBUMIN  2.1* 2.1* 2.1*   No results for input(s): LIPASE, AMYLASE in the last 168  hours. CBC: Recent Labs  Lab 09/14/23 1044 09/15/23 0715 09/18/23 0653 09/19/23 0559 09/20/23 0804  WBC 8.2 6.7 5.0 4.9 7.9  NEUTROABS  --   --   --   --  6.0  HGB 8.6* 7.2* 6.8* 7.9* 9.0*  HCT 26.0* 22.1* 21.7* 24.3* 28.3*  MCV 95.6 94.4 94.8 91.7 91.9  PLT 270 252 297 286 369   Blood Culture    Component Value Date/Time   SDES BLOOD LEFT UPPER ARM 06/24/2023 1641   SDES BLOOD SITE NOT SPECIFIED 06/24/2023 1641   SPECREQUEST  06/24/2023 1641    BOTTLES DRAWN AEROBIC AND ANAEROBIC Blood Culture adequate volume   SPECREQUEST  06/24/2023 1641    BOTTLES DRAWN AEROBIC AND ANAEROBIC Blood Culture results may not be optimal due to an inadequate volume of blood received in culture bottles   CULT  06/24/2023 1641    NO GROWTH 5 DAYS Performed at [facility], [address], [place], [place] [address]    CULT  06/24/2023 1641    NO GROWTH 5 DAYS Performed at [facility], [address], [place], [place] [address]    REPTSTATUS 06/29/2023 FINAL 06/24/2023 1641   REPTSTATUS 06/29/2023 FINAL 06/24/2023 1641    Cardiac Enzymes: No results for input(s): CKTOTAL, CKMB, CKMBINDEX, TROPONINI in the last 168  hours. CBG: Recent Labs  Lab 09/16/23 2246  GLUCAP 125*   Iron  Studies: No results for input(s): IRON , TIBC, TRANSFERRIN, FERRITIN in the last 72 hours. @lablastinr3 @ Studies/Results: DG Chest Port 1 View Result Date: 09/20/2023 CLINICAL DATA:  63 year old female with shortness of breath, dialysis patient, swelling. Status post ultrasound-guided right side thoracentesis 09/15/2023. EXAM: PORTABLE CHEST 1 VIEW COMPARISON:  Portable chest 09/15/2023 and earlier. FINDINGS: Portable AP semi upright view at 0827 hours. Ongoing veiling and confluent opacity at the right lung base, where the peripheral and lateral costophrenic angle veiling opacity appears increased. Stable cardiomegaly and mediastinal contours. Stable associated right hemithorax volume loss.  Interval increased veiling and confluent opacity at the left lung base. No pneumothorax. Stable pulmonary vascularity elsewhere. Visualized tracheal air column is within normal limits. No acute osseous abnormality identified. Paucity of bowel gas. IMPRESSION: 1. Increased small left and similarly re-accumulated right pleural effusions since the post thoracentesis x-ray 09/15/2023. 2. Otherwise stable with cardiomegaly, right lung atelectasis. No overt edema. Electronically Signed   By: VEAR Hurst M.D.   On: 09/20/2023 08:41   Medications:  anticoagulant sodium citrate       Chlorhexidine  Gluconate Cloth  6 each Topical Q0600   Physical Exam General: Chronically ill appearing female who looks older than stated age.  Neuro: Oriented to place and person. No asterixis Heart:S1,S2 No M/R/G Lungs: Coarse rhonchi scattered upper lung fields, bibasilar crackles. Dry hacking cough. O2 via [place]. No WOB.  Abdomen: NABS, NT Extremities: No LE edema Dialysis Access: AVF + T/B  HD orders: No OP Clinic 3.5 hours  2.0 K  BFR 400   Assessment/Plan: 1. Volume overload: HD today for volume removal and usual care. Lower volume as tolerated. Continue daily Torsemide .  2. Small L and Re-accumulated R. Pleural Effusion-S/P thoracentesis 09/15/2023. Management per primary. 2. ESRD - No OP HD clinic. Use T,Th,S schedule while in hospital. Continue NAHCO3 tabs as she periodically refuses to come to HD.  3. Anemia - HGB 9.0 ESA 150 mcg given on 09/17/2023. Follow HGB.  4. Secondary hyperparathyroidism - PO4 , corrected calcium  at goal. Check PTH next HD.  5. HTN/volume - As noted above. BP is actually controlled. Continue OP medications.  6. Nutrition - low albumin . Protein supplements.  7. COPD-continues to smoke. Per primary 8. Polysubstance abuse-watch for withdrawal. Per primary.   Sidnie Swalley H. Mikita Lesmeister NP-C 09/20/2023, 12:40 PM  [employer] [phone]

## 2023-09-20 NOTE — H&P (Signed)
 History and Physical    PatientJOHNDA [name] FMW:[id number] DOB: November 11, 1960 DOA: 09/20/2023 DOS: the patient was seen and examined on 09/20/2023 PCP: Cynthia Barnie NOVAK, MD  Patient coming from: Homeless via EMS  Chief Complaint:  Chief Complaint  Patient presents with   Shortness of Breath   HPI: [name] is a 63 y.o. female with medical history significant of hypertension, polysubstance use (including cocaine and tobacco), end-stage renal disease not on dialysis due to homelessness, history of chronic GI bleeding due to AVMs, GERD, PUD, COPD, and chronic leg pain presents with complaints of shortness of breath.  History is limited due to her being on BiPAP.  Patient had just been hospitalized from 6/21-7/4 initially presenting for need of dialysis and found to have significant anemia with hemoglobin down to 4.3 requiring multiple blood transfusions for which she underwent small bowel enteroscopy 6/24 finding for nonbleeding AVMs in the duodenum and 2 in the jejunum treated with APC by GI prior to signing off.  Patient noted to have a large right pleural effusion as well which was removed thought to be transudative in nature on 6/30.  Complicated disposition due to patient being homeless with requirements for oxygen.  Ultimately patient left AGAINST MEDICAL ADVICE yesterday.  She had dialysis last on 7/3.  Patient was noted to be hypoxic with EMS and working hard to breathe with rales appreciated for which they placed her on CPAP.  In the emergency department patient was noted to be afebrile with O2 saturations currently maintained on BiPAP.  Labs noted hemoglobin 9, sodium 131, potassium 4, BUN 37, creatinine 3.18, BNP 1321, and high-sensitivity troponin 15.  Chest x-ray noted increased mild left and similarly reaccumulated right pleural effusion since thoracentesis on 6/30 and stable cardiomegaly.  Patient had been given Lasix  40 mg IV x 1 dose.  Nephrology consulted to eval.  Review of  Systems: unable to review all systems due to the inability of the patient to answer questions. Past Medical History:  Diagnosis Date   Acute on chronic blood loss anemia 08/01/2022   Acute on chronic diastolic CHF (congestive heart failure) (HCC) 08/12/2022   Acute on chronic respiratory failure with hypoxia (HCC) 05/16/2023   Acute pulmonary edema (HCC) 05/16/2023   Acute seasonal allergic rhinitis due to pollen 02/22/2016   Alcohol  abuse    Allergy    Anxiety    Arteriovenous fistula infection, initial encounter (HCC) 04/16/2023   Arthritis    Asthma    Cannabis abuse    Cocaine abuse (HCC)    COPD (chronic obstructive pulmonary disease) (HCC)    COPD with acute exacerbation (HCC) 10/25/2022   COVID 05/16/2023   Depression    Drug addiction (HCC)    GERD (gastroesophageal reflux disease)    GIB (gastrointestinal bleeding) 08/19/2019   Heart murmur    Heme positive stool 08/14/2022   Homelessness    Hx of adenomatous colonic polyps    Hyperlipidemia    Hypertension    pt stated every once in a while BP will be high but has not been prescribed medication for HTN.    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) 02/22/2016   SIRS (systemic inflammatory response syndrome) (HCC)  10/25/2022   Past Surgical History:  Procedure Laterality Date   A/V FISTULAGRAM Right 01/31/2023   Procedure: A/V Fistulagram;  Surgeon: Gretta Lonni PARAS, MD;  Location: [facility] INVASIVE CV LAB;  Service: Cardiovascular;  Laterality: Right;   AV FISTULA PLACEMENT Right 08/14/2022   Procedure: RIGHT ARM BRACHIOBASILIC ATERIOVENOUS FISTULA CREATION;  Surgeon: Magda Debby SAILOR, MD;  Location: [facility] OR;  Service: Vascular;  Laterality: Right;    BASCILIC VEIN TRANSPOSITION Right 11/13/2022   Procedure: RIGHT ARM SECOND STAGE BASILIC VEIN TRANSPOSITION;  Surgeon: Magda Debby SAILOR, MD;  Location: [facility] OR;  Service: Vascular;  Laterality: Right;   BIOPSY  01/01/2019   Procedure: BIOPSY;  Surgeon: Albertus Gordy HERO, MD;  Location: [facility] ENDOSCOPY;  Service: Endoscopy;;   BIOPSY  11/17/2021   Procedure: BIOPSY;  Surgeon: Charlanne Groom, MD;  Location: [facility] ENDOSCOPY;  Service: Gastroenterology;;   CESAREAN SECTION  1989   COLONOSCOPY  11/07/2020   2018   CYSTOSCOPY W/ URETERAL STENT PLACEMENT Left 08/01/2018   Procedure: CYSTOSCOPY WITH RETROGRADE PYELOGRAM/URETERAL STENT PLACEMENT;  Surgeon: Carolee Sherwood JONETTA DOUGLAS, MD;  Location: [facility] ORS;  Service: Urology;  Laterality: Left;   CYSTOSCOPY WITH RETROGRADE PYELOGRAM, URETEROSCOPY AND STENT PLACEMENT Left 01/15/2019   Procedure: CYSTOSCOPY WITH RETROGRADE PYELOGRAM, URETEROSCOPY AND STENT PLACEMENT;  Surgeon: Carolee Sherwood JONETTA DOUGLAS, MD;  Location: [facility] ORS;  Service: Urology;  Laterality: Left;   ENTEROSCOPY N/A 08/16/2022   Procedure: ENTEROSCOPY;  Surgeon: Wilhelmenia Aloha Raddle., MD;  Location: [facility] ENDOSCOPY;  Service: Gastroenterology;  Laterality: N/A;   ENTEROSCOPY N/A 10/31/2022   Procedure: ENTEROSCOPY;  Surgeon: Albertus Gordy HERO, MD;  Location: [facility] ENDOSCOPY;  Service: Gastroenterology;  Laterality: N/A;   ENTEROSCOPY N/A 05/09/2023   Procedure: ENTEROSCOPY;  Surgeon: Federico Rosario BROCKS, MD;  Location: [facility] ENDOSCOPY;  Service: Gastroenterology;  Laterality: N/A;   ENTEROSCOPY N/A 09/09/2023   Procedure: ENTEROSCOPY;  Surgeon: Federico Rosario BROCKS, MD;  Location: [facility] ENDOSCOPY;  Service: Gastroenterology;  Laterality: N/A;   ESOPHAGOGASTRODUODENOSCOPY (EGD) WITH PROPOFOL  N/A 01/01/2019   Procedure: ESOPHAGOGASTRODUODENOSCOPY (EGD) WITH PROPOFOL ;  Surgeon: Albertus Gordy HERO, MD;  Location: [facility] ENDOSCOPY;  Service: Endoscopy;  Laterality: N/A;   ESOPHAGOGASTRODUODENOSCOPY (EGD) WITH PROPOFOL  N/A 08/21/2019   Procedure:  ESOPHAGOGASTRODUODENOSCOPY (EGD) WITH PROPOFOL ;  Surgeon: Shila Gustav GAILS, MD;  Location: [facility] ENDOSCOPY;  Service: Endoscopy;  Laterality: N/A;   ESOPHAGOGASTRODUODENOSCOPY (EGD) WITH PROPOFOL  N/A 11/17/2021   Procedure: ESOPHAGOGASTRODUODENOSCOPY (EGD) WITH PROPOFOL ;  Surgeon: Charlanne Groom, MD;  Location: [facility] ENDOSCOPY;  Service: Gastroenterology;  Laterality: N/A;   FRACTURE SURGERY Left 2011   arm   HEMOSTASIS CLIP PLACEMENT  08/16/2022   Procedure: HEMOSTASIS CLIP PLACEMENT;  Surgeon: Wilhelmenia Aloha Raddle., MD;  Location: [facility] ENDOSCOPY;  Service: Gastroenterology;;   HOT HEMOSTASIS N/A 08/16/2022   Procedure: HOT HEMOSTASIS (ARGON PLASMA COAGULATION/BICAP);  Surgeon: Wilhelmenia Aloha Raddle., MD;  Location: [facility] ENDOSCOPY;  Service: Gastroenterology;  Laterality: N/A;   HOT HEMOSTASIS N/A 10/31/2022   Procedure: HOT HEMOSTASIS (ARGON PLASMA COAGULATION/BICAP);  Surgeon: Albertus Gordy HERO, MD;  Location: [facility] ENDOSCOPY;  Service: Gastroenterology;  Laterality: N/A;   HOT HEMOSTASIS N/A 05/09/2023   Procedure: HOT HEMOSTASIS (ARGON PLASMA COAGULATION/BICAP);  Surgeon: Federico Rosario BROCKS, MD;  Location: [facility] ENDOSCOPY;  Service: Gastroenterology;  Laterality: N/A;   HOT HEMOSTASIS N/A 09/09/2023   Procedure: EGD, WITH ARGON PLASMA COAGULATION;  Surgeon: Federico Rosario BROCKS, MD;  Location: [facility] ENDOSCOPY;  Service: Gastroenterology;  Laterality: N/A;   INSERTION OF DIALYSIS CATHETER Right 08/14/2022   Procedure: INSERTION OF TUNNELED DIALYSIS  CATHETER USING PALINDROME CATHETER KIT 19CM;  Surgeon: Magda Debby SAILOR, MD;  Location: [facility] OR;  Service: Vascular;  Laterality: Right;   IR THORACENTESIS ASP PLEURAL SPACE W/IMG GUIDE  09/15/2023   POLYPECTOMY  08/16/2022   Procedure: POLYPECTOMY;  Surgeon: Wilhelmenia Aloha Raddle., MD;  Location: [facility] ENDOSCOPY;  Service: Gastroenterology;;   SUBMUCOSAL TATTOO INJECTION  08/16/2022   Procedure: SUBMUCOSAL TATTOO INJECTION;  Surgeon: Wilhelmenia Aloha Raddle., MD;  Location: [facility] ENDOSCOPY;   Service: Gastroenterology;;   UPPER GASTROINTESTINAL ENDOSCOPY     Social History:  reports that she has been smoking cigarettes. She has been exposed to tobacco smoke. She has never used smokeless tobacco. She reports that she does not currently use alcohol . She reports current drug use. Drug: Marijuana.  Allergies  Allergen Reactions   Zestril  [Lisinopril ] Other (See Comments)    Hyperkalemia    Family History  Problem Relation Age of Onset   Diabetes Father    Colon cancer Neg Hx    Esophageal cancer Neg Hx    Rectal cancer Neg Hx    Stomach cancer Neg Hx     Prior to Admission medications   Medication Sig Start Date End Date Taking? Authorizing Provider  acetaminophen  (TYLENOL ) 325 MG tablet Take 2 tablets (650 mg total) by mouth every 6 (six) hours as needed for mild pain (pain score 1-3) (or Fever >/= 101). 07/01/23   Danton Reyes DASEN, MD  albuterol  (VENTOLIN  HFA) 108 [phone] Base) MCG/ACT inhaler Inhale 2 puffs into the lungs every 6 (six) hours as needed for wheezing or shortness of breath. 07/26/23   Gherghe, Costin M, MD  amLODipine  (NORVASC ) 10 MG tablet Take 1 tablet (10 mg total) by mouth daily. 07/26/23   Gherghe, Costin M, MD  atorvastatin  (LIPITOR) 10 MG tablet Take 1 tablet (10 mg total) by mouth daily. 07/26/23   Gherghe, Costin M, MD  calcitRIOL  (ROCALTROL ) 0.25 MCG capsule Take 1 capsule (0.25 mcg total) by mouth daily. 07/26/23   Gherghe, Costin M, MD  carvedilol  (COREG ) 6.25 MG tablet Take 1 tablet (6.25 mg total) by mouth 2 (two) times daily with a meal. 07/26/23   Trixie Nilda HERO, MD  ferrous sulfate  325 (65 FE) MG tablet Take 1 tablet (325 mg total) by mouth daily with breakfast. 07/26/23   Trixie Nilda HERO, MD  gabapentin  (NEURONTIN ) 100 MG capsule Take 1 capsule (100 mg total) by mouth 3 (three) times daily. 07/26/23 10/24/23  Gherghe, Costin M, MD  nicotine  (NICODERM CQ  - DOSED IN MG/24 HOURS) 21 mg/24hr patch Place 1 patch (21 mg total) onto the skin daily. 07/27/23    Gherghe, Costin M, MD  oxyCODONE  (OXY IR/ROXICODONE ) 5 MG immediate release tablet Take 1 tablet (5 mg total) by mouth every 4 (four) hours as needed for severe pain (pain score 7-10). 07/26/23   Gherghe, Costin M, MD  pantoprazole  (PROTONIX ) 40 MG tablet Take 1 tablet (40 mg total) by mouth 2 (two) times daily. 07/26/23 08/25/23  Gherghe, Costin M, MD  sertraline  (ZOLOFT ) 25 MG tablet Take 1 tablet (25 mg total) by mouth daily. 07/26/23   Gherghe, Costin M, MD  sodium bicarbonate  650 MG tablet Take 2 tablets (1,300 mg total) by mouth 2 (two) times daily. 07/26/23 10/24/23  Gherghe, Costin M, MD  torsemide  (DEMADEX ) 100 MG tablet Take 1 tablet (100 mg total) by mouth daily. 07/26/23   Gherghe, Costin M, MD  traZODone  (DESYREL ) 50 MG tablet Take 1 tablet (50 mg total) by mouth at bedtime  as needed for sleep. 07/26/23   Gherghe, Costin M, MD  TRELEGY ELLIPTA  100-62.5-25 MCG/ACT AEPB Inhale 1 puff into the lungs daily. 07/26/23   Trixie Nilda HERO, MD    Physical Exam: Vitals:   09/20/23 0805 09/20/23 0810  BP: (!) 146/64   Pulse: 74   Resp: 20   Temp:  (!) 97.3 F (36.3 C)  TempSrc:  Axillary  SpO2: 100%    Constitutional: Chronically ill-appearing adult female currently in no acute distress Eyes: PERRL, lids and conjunctivae normal ENMT: Mucous membranes are moist.  Poor dentition.  Neck: normal, supple.  JVD present. Respiratory: Decreased aeration currently on BiPAP. Cardiovascular: Regular rate and rhythm.  +1 pitting lower extremity edema.  MV fistula in place. Abdomen: no tenderness, no masses palpated. Bowel sounds positive.  Musculoskeletal: no clubbing / cyanosis. No joint deformity upper and lower extremities. Good ROM, no contractures. Normal muscle tone.  Skin: Multiple bruises/scars of the bilateral lower extremities Neurologic: CN 2-12 grossly intact.  Strength 5/5 in all 4.  Psychiatric: Normal judgment and insight.  Lethargic and oriented x 3. Normal mood.   Data  Reviewed:  Reviewed labs, imaging, and pertinent records as documented  Assessment and Plan:  Acute respiratory failure with hypoxia secondary to fluid overload ESRD on HD Patient presents with complaints of shortness of breath after recently leaving the hospital.  Reported to be hypoxic on arrival with EMS for which she was placed on CPAP.  On physical exam patient with JVD and lower extremity swelling for which she was switched to BiPAP in the ED.  Chest x-ray noted increased mild left and similarly reaccumulated right pleural effusion since thoracentesis on 6/30 and stable cardiomegaly.  BNP elevated at 1321.  Nephrology consulted for need of dialysis. - Admit to a progressive bed - Continuous pulse oximetry with oxygen to maintain O2 saturations greater than 90% - Continue BiPAP.  Plan to wean back to nasal cannula oxygen once able - Continue current medication regimen once able - Appreciate nephrology consultative services,  will follow-up for any further recommendation  Right pleural effusion Chronic diastolic CHF TTE in 01/2023 with LVEF of 50 to 55%. Patient had thoracentesis on 6/30 that was transudative in nature. - Fluid management with dialysis  Anemia of chronic disease Hemoglobin stable at 9 and had previously been 7.9 prior to leaving yesterday.  Patient had received at least 4 units of packed red blood cells during hospitalization. - Continue to monitor H&H - Aranesp  and IV iron  per nephrology    AVM Nonbleeding AVMs noted on endoscopy from 6/24.  Treated with APC.  Ide-term use of Carafate  was reported to have been discontinued during last hospitalization due to concerns of aluminum toxicity with prolonged use - Continue Protonix   Essential hypertension - Continue current blood pressure regimen as tolerated  Chronic COPD - Continue pharmacy substitution of Breztri  and albuterol  nebs as needed   Anxiety and depression - Continue sertraline  and trazodone     Hyperlipidemia: - Continue Lipitor   Tobacco abuse Reports cutting down on smoking.   - Continue nicotine  patch  Polysubstance abuse Patient reported last use of cocaine was prior to coming into the hospital on 6/21.  Homelessness Complicating care. -Transitions of care consulted   DVT prophylaxis: SCDs.  Start subcutaneous heparin  if hemoglobin remains stable Advance Care Planning:   Code Status: Do not attempt resuscitation (DNR) PRE-ARREST INTERVENTIONS DESIRED   Consults: Nephrology  Family Communication: None  Severity of Illness: The appropriate patient status for  this patient is INPATIENT. Inpatient status is judged to be reasonable and necessary in order to provide the required intensity of service to ensure the patient's safety. The patient's presenting symptoms, physical exam findings, and initial radiographic and laboratory data in the context of their chronic comorbidities is felt to place them at high risk for further clinical deterioration. Furthermore, it is not anticipated that the patient will be medically stable for discharge from the hospital within 2 midnights of admission.   * I certify that at the point of admission it is my clinical judgment that the patient will require inpatient hospital care spanning beyond 2 midnights from the point of admission due to high intensity of service, high risk for further deterioration and high frequency of surveillance required.*  Author: Maximino DELENA Sharps, MD 09/20/2023 10:16 AM  For on call review [email or web address].

## 2023-09-20 NOTE — Procedures (Signed)
 Patient was seen on dialysis and the procedure was supervised.  BFR 400  Via AVF BP is  137/58.   Patient appears to be tolerating treatment well  [name] [name] 09/20/2023

## 2023-09-20 NOTE — ED Notes (Signed)
 Pt ripped of bi-pap stating it is hurting my head Pt is crying and moaning. Pt placed on 6L [place] and O2 is 100%. Admitting MD Claudene advised.

## 2023-09-21 ENCOUNTER — Inpatient Hospital Stay (HOSPITAL_COMMUNITY)

## 2023-09-21 DIAGNOSIS — I5033 Acute on chronic diastolic (congestive) heart failure: Secondary | ICD-10-CM

## 2023-09-21 LAB — CBC
HCT: 23.6 % — ABNORMAL LOW (ref 36.0–46.0)
Hemoglobin: 7.3 g/dL — ABNORMAL LOW (ref 12.0–15.0)
MCH: 29.2 pg (ref 26.0–34.0)
MCHC: 30.9 g/dL (ref 30.0–36.0)
MCV: 94.4 fL (ref 80.0–100.0)
Platelets: 313 K/uL (ref 150–400)
RBC: 2.5 MIL/uL — ABNORMAL LOW (ref 3.87–5.11)
RDW: 18.6 % — ABNORMAL HIGH (ref 11.5–15.5)
WBC: 5.8 K/uL (ref 4.0–10.5)
nRBC: 0 % (ref 0.0–0.2)

## 2023-09-21 LAB — RENAL FUNCTION PANEL
Albumin: 2.1 g/dL — ABNORMAL LOW (ref 3.5–5.0)
Anion gap: 9 (ref 5–15)
BUN: 22 mg/dL (ref 8–23)
CO2: 30 mmol/L (ref 22–32)
Calcium: 8.3 mg/dL — ABNORMAL LOW (ref 8.9–10.3)
Chloride: 96 mmol/L — ABNORMAL LOW (ref 98–111)
Creatinine, Ser: 2.11 mg/dL — ABNORMAL HIGH (ref 0.44–1.00)
GFR, Estimated: 26 mL/min — ABNORMAL LOW (ref >60)
Glucose, Bld: 100 mg/dL — ABNORMAL HIGH (ref 70–99)
Phosphorus: 3.3 mg/dL (ref 2.5–4.6)
Potassium: 4 mmol/L (ref 3.5–5.1)
Sodium: 135 mmol/L (ref 135–145)

## 2023-09-21 MED ORDER — IPRATROPIUM-ALBUTEROL 0.5-2.5 (3) MG/3ML IN SOLN
3.0000 mL | RESPIRATORY_TRACT | Status: DC | PRN
  Administered 2023-09-26: 3 mL via RESPIRATORY_TRACT
  Filled 2023-09-21: qty 3

## 2023-09-21 MED ORDER — OXYCODONE HCL 5 MG PO TABS
5.0000 mg | ORAL_TABLET | Freq: Three times a day (TID) | ORAL | Status: AC | PRN
  Administered 2023-09-21: 5 mg via ORAL
  Filled 2023-09-21: qty 1

## 2023-09-21 MED ORDER — LIDOCAINE HCL (PF) 1 % IJ SOLN: 8.0000 mL | Freq: Once | INTRAMUSCULAR | Status: DC

## 2023-09-21 MED ORDER — DARBEPOETIN ALFA 200 MCG/0.4ML IJ SOSY
200.0000 ug | PREFILLED_SYRINGE | INTRAMUSCULAR | Status: DC
  Administered 2023-09-23: 200 ug via SUBCUTANEOUS
  Filled 2023-09-21 (×2): qty 0.4

## 2023-09-21 NOTE — Progress Notes (Signed)
 PROGRESS NOTE  [name] FMW:[id number] DOB: 1960/06/23   PCP: Vicci Barnie NOVAK, MD  Patient is from: Homeless  DOA: 09/20/2023 LOS: 1  Chief complaints Chief Complaint  Patient presents with   Shortness of Breath     Brief Narrative / Interim history: 63 year old F with PMH of ESRD not on HD due to homelessness, polysubstance use including cocaine and tobacco, chronic GI bleed due to AVMs, PUD, COPD, HTN, GERD and chronic leg and back pain return to ED with shortness of breath after she left AMA a day prior, and admitted with fluid overload and respiratory distress requiring BiPAP.  BNP elevated to 1321.  CXR with increased mild left and similarly reaccumulated right pleural effusion and cardiomegaly.  UDS positive for cocaine.  Received IV Lasix .  Nephrology consulted.  Patient underwent HD with ultrafiltration of 4 L overnight.  Came off BiPAP.  Subjective: Seen and examined earlier this morning.  No major events overnight or this morning.  Complains chest and back pain.  Reports taking oxycodone  at home.  Also reports some shortness of breath but improved.  Denies cocaine use since she left the hospital.  She was in the hospital for 12 days before she left AMA on 7/4  Objective: Vitals:   09/21/23 0315 09/21/23 0636 09/21/23 0735 09/21/23 0928  BP: 130/66  (!) 125/50   Pulse: 65  70 71  Resp: 15  18 18   Temp: 97.9 F (36.6 C)  97.9 F (36.6 C)   TempSrc: Oral  Oral   SpO2: 98%  100% 99%  Weight:  57.6 kg      Examination:  GENERAL: No apparent distress.  Nontoxic. HEENT: MMM.  Vision and hearing grossly intact.  NECK: Supple.  No apparent JVD.  RESP:  No IWOB.  Rhonchi bilaterally. CVS:  RRR. Heart sounds normal.  ABD/GI/GU: BS+. Abd soft, NTND.  MSK/EXT:  Moves extremities. No apparent deformity. No edema.  SKIN: no apparent skin lesion or wound NEURO: AA.  Oriented appropriately.  No apparent focal neuro deficit. PSYCH: Calm. Normal affect.   Consultants:   Nephrology  Procedures: Hemodialysis  Microbiology summarized: None  Assessment and plan: Acute on chronic respiratory failure with hypoxia due to fluid overload in the setting of ESRD and COPD: Presents with respiratory distress and required BiPAP in ED.  CXR with reaccumulation of right pleural effusion.  Improved after dialysis and ultrafiltration.  Currently on 4 L.   -S/p HD with ultrafiltration of 4 L overnight -IR thoracocentesis for right pleural effusion - Continue home Breztri  - Change albuterol  to DuoNebs as needed - IS, OOB, PT/OT - Ambulatory saturation  ESRD/volume overload: no outpatient dialysis chair due to homelessness. Bone mineral disorder -Per nephrology.  Chronic COPD: She has cough with rhonchi on exam.  Chronic smoker. - Inhalers and nebulizers as above - Hold off systemic steroid given history of PUD and GI bleed unless worse.  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 Small bowel AVMs and PUD Recent Labs    09/10/23 0542 09/10/23 2029 09/11/23 0646 09/12/23 0729 09/14/23 1044 09/15/23 0715 09/18/23 0653 09/19/23 0559 09/20/23 0804 09/21/23 0324  HGB 6.5* 9.3* 8.5* 8.5* 8.6* 7.2* 6.8* 7.9* 9.0* 7.3*  - Continue Protonix    Acute on chronic diastolic CHF: Likely due to ESRD.  TTE in 01/2023 with LVEF of 50 to 55%.  - Fluid management by HD per nephrology   Right pleural effusion: Likely due to CHF and ESRD. -IR consulted for thoracocentesis. -  Fluid management by HD but refusing dialysis.   Hyponatremia: Likely due to ESRD.  Improved. - Continue monitoring   Essential hypertension: Normotensive -Continue Coreg  -Continue holding amlodipine    Anxiety and depression: -Continue sertraline , trazodone .   Hyperlipidemia: -Continue Lipitor   Polysubstance abuse: Including cocaine and tobacco.  UDS positive for cocaine but she denies using cocaine since she left the hospital.  -Continue nicotine  patch -Encouraged  cessation -TOC consulted   Homelessness: Complicating care. -TOC consult   Body mass index is 19.88 kg/m.          DVT prophylaxis:  heparin  injection 5,000 Units Start: 09/20/23 1500 Place and maintain sequential compression device Start: 09/20/23 1153  Code Status: DNR-Limited Family Communication: None at bedside Level of care: Progressive Status is: Inpatient Remains inpatient appropriate because: Respiratory failure, volume overload, ESRD   Final disposition: To be determined   55 minutes with more than 50% spent in reviewing records, counseling patient/family and coordinating care.   Sch Meds:  Scheduled Meds:  (feeding supplement) PROSource Plus  30 mL Oral BID BM   atorvastatin   10 mg Oral Daily   budesonide -glycopyrrolate -formoterol   2 puff Inhalation BID   calcitRIOL   0.25 mcg Oral Daily   carvedilol   6.25 mg Oral BID WC   Chlorhexidine  Gluconate Cloth  6 each Topical Q0600   feeding supplement (NEPRO CARB STEADY)  237 mL Oral BID BM   ferrous sulfate   325 mg Oral Q breakfast   gabapentin   100 mg Oral TID   heparin   5,000 Units Subcutaneous Q8H   lidocaine   1 patch Transdermal Q24H   multivitamin  1 tablet Oral QHS   nicotine   21 mg Transdermal Daily   pantoprazole   40 mg Oral BID   sertraline   25 mg Oral Daily   sodium bicarbonate   1,300 mg Oral BID   sodium chloride  flush  3 mL Intravenous Q12H   torsemide   100 mg Oral Daily   Continuous Infusions: PRN Meds:.acetaminophen  **OR** acetaminophen , albuterol , naLOXone  (NARCAN )  injection, oxyCODONE , traZODone   Antimicrobials: Anti-infectives (From admission, onward)    None        I have personally reviewed the following labs and images: CBC: Recent Labs  Lab 09/15/23 0715 09/18/23 0653 09/19/23 0559 09/20/23 0804 09/21/23 0324  WBC 6.7 5.0 4.9 7.9 5.8  NEUTROABS  --   --   --  6.0  --   HGB 7.2* 6.8* 7.9* 9.0* 7.3*  HCT 22.1* 21.7* 24.3* 28.3* 23.6*  MCV 94.4 94.8 91.7 91.9 94.4   PLT 252 297 286 369 313   BMP &GFR Recent Labs  Lab 09/15/23 0715 09/16/23 0638 09/17/23 0639 09/18/23 0653 09/19/23 0559 09/20/23 0804 09/21/23 0324  NA 119* 117* 126* 124* 131* 131* 135  K 4.4 4.6 3.8 4.1 3.8 4.0 4.0  CL 82* 85* 87* 87* 92* 91* 96*  CO2 21* 20* 26 26 28 27 30   GLUCOSE 119* 115* 99 94 92 100* 100*  BUN 70* 78* 39* 52* 24* 37* 22  CREATININE 5.13* 5.42* 3.32* 4.09* 2.25* 3.18* 2.11*  CALCIUM  7.6* 7.1* 7.8* 8.2* 8.3* 8.9 8.3*  MG 2.1  --   --   --  2.0  --   --   PHOS 6.6* 6.8* 4.8* 5.7* 3.3  --  3.3   Estimated Creatinine Clearance: 24.8 mL/min (A) (by C-G formula based on SCr of 2.11 mg/dL (H)). Liver & Pancreas: Recent Labs  Lab 09/16/23 9361 09/17/23 9360 09/18/23 9346 09/19/23 0559 09/21/23 9675  ALBUMIN  2.2* 2.1* 2.1* 2.1* 2.1*   No results for input(s): LIPASE, AMYLASE in the last 168 hours. No results for input(s): AMMONIA in the last 168 hours. Diabetic: No results for input(s): HGBA1C in the last 72 hours. Recent Labs  Lab 09/16/23 2246  GLUCAP 125*   Cardiac Enzymes: No results for input(s): CKTOTAL, CKMB, CKMBINDEX, TROPONINI in the last 168 hours. No results for input(s): PROBNP in the last 8760 hours. Coagulation Profile: No results for input(s): INR, PROTIME in the last 168 hours. Thyroid Function Tests: No results for input(s): TSH, T4TOTAL, FREET4, T3FREE, THYROIDAB in the last 72 hours. Lipid Profile: No results for input(s): CHOL, HDL, LDLCALC, TRIG, CHOLHDL, LDLDIRECT in the last 72 hours. Anemia Panel: No results for input(s): VITAMINB12, FOLATE, FERRITIN, TIBC, IRON , RETICCTPCT in the last 72 hours. Urine analysis:    Component Value Date/Time   COLORURINE YELLOW 09/20/2023 0831   APPEARANCEUR HAZY (A) 09/20/2023 0831   LABSPEC 1.005 09/20/2023 0831   PHURINE 7.0 09/20/2023 0831   GLUCOSEU NEGATIVE 09/20/2023 0831   HGBUR SMALL (A) 09/20/2023 0831    BILIRUBINUR NEGATIVE 09/20/2023 0831   KETONESUR NEGATIVE 09/20/2023 0831   PROTEINUR 100 (A) 09/20/2023 0831   UROBILINOGEN 0.2 03/02/2010 2027   NITRITE NEGATIVE 09/20/2023 0831   LEUKOCYTESUR LARGE (A) 09/20/2023 0831   Sepsis Labs: Invalid input(s): PROCALCITONIN, LACTICIDVEN  Microbiology: No results found for this or any previous visit (from the past 240 hours).  Radiology Studies: No results found.    Oluwatoyin Banales T. Ercole Georg [facility]  Hospitalist  If 7PM-7AM, please contact night-coverage [email or web address] 09/21/2023, 10:58 AM

## 2023-09-21 NOTE — Progress Notes (Signed)
 Pt. Asking for more pain medication. Patient says tylenol  does not help her. Notified Dr. Gonfa. Per MD, Pt. Cannot have anything else for pain.

## 2023-09-21 NOTE — Progress Notes (Signed)
 Bipap not needed at this time, will continue to monitor patient

## 2023-09-21 NOTE — Progress Notes (Addendum)
 [facility] Progress Note   Subjective: Seen in room, eating ravenously! Not talking very much but obviously can breath well enough to eat. Does say she feels better.     Objective Vitals:   09/21/23 0735 09/21/23 0928 09/21/23 1206 09/21/23 1220  BP: (!) 125/50   (!) 136/59  Pulse: 70 71 68 88  Resp: 18 18 13 15   Temp: 97.9 F (36.6 C)   (!) 96.4 F (35.8 C)  TempSrc: Oral   Axillary  SpO2: 100% 99% 100% 100%  Weight:        Additional Objective Labs: Basic Metabolic Panel: Recent Labs  Lab 09/18/23 0653 09/19/23 0559 09/20/23 0804 09/21/23 0324  NA 124* 131* 131* 135  K 4.1 3.8 4.0 4.0  CL 87* 92* 91* 96*  CO2 26 28 27 30   GLUCOSE 94 92 100* 100*  BUN 52* 24* 37* 22  CREATININE 4.09* 2.25* 3.18* 2.11*  CALCIUM  8.2* 8.3* 8.9 8.3*  PHOS 5.7* 3.3  --  3.3   Liver Function Tests: Recent Labs  Lab 09/18/23 0653 09/19/23 0559 09/21/23 0324  ALBUMIN  2.1* 2.1* 2.1*   No results for input(s): LIPASE, AMYLASE in the last 168 hours. CBC: Recent Labs  Lab 09/15/23 0715 09/18/23 0653 09/19/23 0559 09/20/23 0804 09/21/23 0324  WBC 6.7 5.0 4.9 7.9 5.8  NEUTROABS  --   --   --  6.0  --   HGB 7.2* 6.8* 7.9* 9.0* 7.3*  HCT 22.1* 21.7* 24.3* 28.3* 23.6*  MCV 94.4 94.8 91.7 91.9 94.4  PLT 252 297 286 369 313   Blood Culture    Component Value Date/Time   SDES BLOOD LEFT UPPER ARM 06/24/2023 1641   SDES BLOOD SITE NOT SPECIFIED 06/24/2023 1641   SPECREQUEST  06/24/2023 1641    BOTTLES DRAWN AEROBIC AND ANAEROBIC Blood Culture adequate volume   SPECREQUEST  06/24/2023 1641    BOTTLES DRAWN AEROBIC AND ANAEROBIC Blood Culture results may not be optimal due to an inadequate volume of blood received in culture bottles   CULT  06/24/2023 1641    NO GROWTH 5 DAYS Performed at [facility], [address] [place], [place] [address]    CULT  06/24/2023 1641    NO GROWTH 5 DAYS Performed at [facility], [address],  [place], [place] [address]    REPTSTATUS 06/29/2023 FINAL 06/24/2023 1641   REPTSTATUS 06/29/2023 FINAL 06/24/2023 1641    Cardiac Enzymes: No results for input(s): CKTOTAL, CKMB, CKMBINDEX, TROPONINI in the last 168 hours. CBG: Recent Labs  Lab 09/16/23 2246  GLUCAP 125*   Iron  Studies: No results for input(s): IRON , TIBC, TRANSFERRIN, FERRITIN in the last 72 hours. @lablastinr3 @ Studies/Results: DG Chest 1 View Result Date: 09/21/2023 CLINICAL DATA:  62 year old female status post left side ultrasound-guided thoracentesis this morning. Dialysis patient. EXAM: CHEST  1 VIEW COMPARISON:  Thoracentesis images 10 47 hours today. Portable chest yesterday. FINDINGS: Portable AP semi upright view at 1136 hours. Stable lung volumes. Stable cardiac size and mediastinal contours. Significantly regressed left lung base pleural effusion from the chest x-ray yesterday. No pneumothorax identified. Stable right lung base and peripheral right lung opacity compatible with at least partially loculated effusion, associated airspace disease. No areas of worsening ventilation. IMPRESSION: 1. Regressed left pleural effusion post thoracentesis with no pneumothorax. 2. Stable right lung base opacity compatible with more loculated effusion and associated airspace disease. Electronically Signed   By: VEAR Hurst M.D.   On: 09/21/2023  11:57   DG Chest Port 1 View Result Date: 09/20/2023 CLINICAL DATA:  63 year old female with shortness of breath, dialysis patient, swelling. Status post ultrasound-guided right side thoracentesis 09/15/2023. EXAM: PORTABLE CHEST 1 VIEW COMPARISON:  Portable chest 09/15/2023 and earlier. FINDINGS: Portable AP semi upright view at 0827 hours. Ongoing veiling and confluent opacity at the right lung base, where the peripheral and lateral costophrenic angle veiling opacity appears increased. Stable cardiomegaly and mediastinal contours. Stable associated right hemithorax volume loss.  Interval increased veiling and confluent opacity at the left lung base. No pneumothorax. Stable pulmonary vascularity elsewhere. Visualized tracheal air column is within normal limits. No acute osseous abnormality identified. Paucity of bowel gas. IMPRESSION: 1. Increased small left and similarly re-accumulated right pleural effusions since the post thoracentesis x-ray 09/15/2023. 2. Otherwise stable with cardiomegaly, right lung atelectasis. No overt edema. Electronically Signed   By: VEAR Hurst M.D.   On: 09/20/2023 08:41   Medications:   (feeding supplement) PROSource Plus  30 mL Oral BID BM   atorvastatin   10 mg Oral Daily   budesonide -glycopyrrolate -formoterol   2 puff Inhalation BID   calcitRIOL   0.25 mcg Oral Daily   carvedilol   6.25 mg Oral BID WC   Chlorhexidine  Gluconate Cloth  6 each Topical Q0600   feeding supplement (NEPRO CARB STEADY)  237 mL Oral BID BM   ferrous sulfate   325 mg Oral Q breakfast   gabapentin   100 mg Oral TID   heparin   5,000 Units Subcutaneous Q8H   lidocaine   1 patch Transdermal Q24H   lidocaine  (PF)  8 mL Intradermal Once   multivitamin  1 tablet Oral QHS   nicotine   21 mg Transdermal Daily   pantoprazole   40 mg Oral BID   sertraline   25 mg Oral Daily   sodium bicarbonate   1,300 mg Oral BID   sodium chloride  flush  3 mL Intravenous Q12H   torsemide   100 mg Oral Daily   Background:  [name] is a 63 Y/O female who was admitted to [facility] 09/06/2023 for acute blood loss anemia, volume overload. PMH: ESRD patient who is homeless, does not have OP HD clinic, H/O polysubstance abuse. GI consulted. Endoscopy 09/09/2023. Non-bleeding AVMS noted. Treated with APC Last HD 09/18/2023. She left hospital [facility] 09/19/2023. She returned to ED this AM with C/O SOB. CXR showed increased small left and similarly re-accumulated right pleural effusions since the post thoracentesis x-ray 09/15/2023. NA-131 SCr 3.18 BUN 37 K+ 4.0 CO2 27. Initially she was placed on BIPAP but she  refused. Urgent HD overnight 09/20/2023. Net UF 4 liters.    Physical Exam General: Chronically ill appearing female who looks older than stated age.  Neuro: Oriented to place and person. No asterixis Heart:S1,S2 No M/R/G Lungs: Coarse rhonchi scattered upper lung fields. . Dry hacking cough. O2 via [place]. No WOB.  Abdomen: NABS, NT Extremities: No LE edema Dialysis Access: AVF + T/B   HD orders: No OP Clinic 3.5 hours  2.0 K  BFR 400    Assessment/Plan: 1. Volume overload: HD 09/20/2023 net UF 4L Volume status improved. Lower volume as tolerated. Continue daily Torsemide .  2. Small L and Re-accumulated R. Pleural Effusion-S/P thoracentesis 09/15/2023. Management per primary. 2. ESRD - No OP HD clinic. Use T,Th,S schedule while in hospital. Continue NAHCO3 tabs as she periodically refuses to come to HD. Next HD 09/23/2023 3. Anemia - HGB 9.0n admission.  ESA 150 mcg given on 09/17/2023 Will order for 09/23/2023. Check iron  panel. Follow HGB.  Transfuse if HGB < 7.0.  4. Secondary hyperparathyroidism - PO4 , corrected calcium  at goal. Check PTH next HD.  5. HTN/volume - As noted above. BP is actually controlled. Continue OP medications.  6. Nutrition - low albumin . Protein supplements.  7. COPD-continues to smoke. Per primary 8. Polysubstance abuse-watch for withdrawal. Per primary.   Awesome Jared H. Finnigan Warriner NP-C 09/21/2023, 12:35 PM  [employer] [phone]

## 2023-09-21 NOTE — Plan of Care (Signed)
   Problem: Clinical Measurements: Goal: Will remain free from infection Outcome: Progressing   Problem: Clinical Measurements: Goal: Diagnostic test results will improve Outcome: Progressing   Problem: Clinical Measurements: Goal: Respiratory complications will improve Outcome: Progressing   Problem: Clinical Measurements: Goal: Cardiovascular complication will be avoided Outcome: Progressing

## 2023-09-22 DIAGNOSIS — N186 End stage renal disease: Secondary | ICD-10-CM

## 2023-09-22 DIAGNOSIS — Z992 Dependence on renal dialysis: Secondary | ICD-10-CM

## 2023-09-22 LAB — IRON AND TIBC
Iron: 27 ug/dL — ABNORMAL LOW (ref 28–170)
Saturation Ratios: 9 % — ABNORMAL LOW (ref 10.4–31.8)
TIBC: 297 ug/dL (ref 250–450)
UIBC: 270 ug/dL

## 2023-09-22 LAB — RENAL FUNCTION PANEL
Albumin: 2.3 g/dL — ABNORMAL LOW (ref 3.5–5.0)
Anion gap: 13 (ref 5–15)
BUN: 41 mg/dL — ABNORMAL HIGH (ref 8–23)
CO2: 27 mmol/L (ref 22–32)
Calcium: 8.5 mg/dL — ABNORMAL LOW (ref 8.9–10.3)
Chloride: 91 mmol/L — ABNORMAL LOW (ref 98–111)
Creatinine, Ser: 3.22 mg/dL — ABNORMAL HIGH (ref 0.44–1.00)
GFR, Estimated: 16 mL/min — ABNORMAL LOW (ref >60)
Glucose, Bld: 87 mg/dL (ref 70–99)
Phosphorus: 3.6 mg/dL (ref 2.5–4.6)
Potassium: 4.5 mmol/L (ref 3.5–5.1)
Sodium: 131 mmol/L — ABNORMAL LOW (ref 135–145)

## 2023-09-22 LAB — CBC
HCT: 23.8 % — ABNORMAL LOW (ref 36.0–46.0)
Hemoglobin: 7.3 g/dL — ABNORMAL LOW (ref 12.0–15.0)
MCH: 29.3 pg (ref 26.0–34.0)
MCHC: 30.7 g/dL (ref 30.0–36.0)
MCV: 95.6 fL (ref 80.0–100.0)
Platelets: 307 K/uL (ref 150–400)
RBC: 2.49 MIL/uL — ABNORMAL LOW (ref 3.87–5.11)
RDW: 18.6 % — ABNORMAL HIGH (ref 11.5–15.5)
WBC: 5.2 K/uL (ref 4.0–10.5)
nRBC: 0.4 % — ABNORMAL HIGH (ref 0.0–0.2)

## 2023-09-22 LAB — FERRITIN: Ferritin: 244 ng/mL (ref 11–307)

## 2023-09-22 LAB — MAGNESIUM: Magnesium: 2 mg/dL (ref 1.7–2.4)

## 2023-09-22 MED ORDER — IRON SUCROSE 200 MG IVPB - SIMPLE MED: 200.0000 mg | Status: DC

## 2023-09-22 MED ORDER — OXYCODONE HCL 5 MG PO TABS
5.0000 mg | ORAL_TABLET | Freq: Four times a day (QID) | ORAL | Status: DC | PRN
  Administered 2023-09-22 – 2023-09-27 (×11): 5 mg via ORAL
  Filled 2023-09-22 (×12): qty 1

## 2023-09-22 MED ORDER — CHLORHEXIDINE GLUCONATE CLOTH 2 % EX PADS
6.0000 | MEDICATED_PAD | Freq: Every day | CUTANEOUS | Status: DC
Start: 2023-09-23 — End: 2023-09-27
  Administered 2023-09-23: 6 via TOPICAL

## 2023-09-22 MED ORDER — IRON SUCROSE 200 MG IVPB - SIMPLE MED
200.0000 mg | Status: DC
  Filled 2023-09-22: qty 110

## 2023-09-22 MED ORDER — IRON SUCROSE 500 MG IVPB - SIMPLE MED
500.0000 mg | INTRAVENOUS | Status: DC
  Filled 2023-09-22: qty 275

## 2023-09-22 NOTE — Progress Notes (Signed)
 PT Cancellation Note  Patient Details Name: [name] MRN: [id number] DOB: 08-29-1960   Cancelled Treatment:    Reason Eval/Treat Not Completed: Patient declined, no reason specified (pt refused at this time stating she wants to watch tv. aware of purpose of eval and states she'll do it later)   Gwynn Chalker B Chrishawn Boley 09/22/2023, 8:43 AM [place] SQUIBB, PT Acute Rehabilitation Services Office: [phone]

## 2023-09-22 NOTE — Progress Notes (Signed)
   09/22/23 0910  Vitals  Pulse Rate 80  ECG Heart Rate 80  Resp (!) 22  MEWS COLOR  MEWS Score Color Green  Oxygen Therapy  SpO2 (!) 72 %  MEWS Score  MEWS Temp 0  MEWS Systolic 0  MEWS Pulse 0  MEWS RR 1  MEWS LOC 0  MEWS Score 1   RN rounded on patient after hearing tele alarms sounding off into the hallway. Patient on BSC, with telemonitor and oxygen cannula removed. RN re-connected tele and pulse oximetry and patient saturations are in the mid 70s. RN asked patient to re-apply their nasal cannula. Patient refused x 2. Fairy MD on the unit and made aware.

## 2023-09-22 NOTE — Progress Notes (Signed)
 PT Cancellation Note  Patient Details Name: [name] MRN: [id number] DOB: June 12, 1960   Cancelled Treatment:    Reason Eval/Treat Not Completed: Patient declined, no reason specified (attempted a 2nd time to see pt and she again refused stating she is watching tv. Pt reports moving around independently in room, to toilet and that she doesn't need assist. Pt states she doesn't need O2 despite SPO2 and will not wear at D/C.PT s/p eval and d/c from services on 7/4 with pt stating no change. Pt in agreement to no therapy needs at this time and will defer to nursing/mobility for walking O2 sat prior to d/c)   Anel Creighton B Dishawn Bhargava 09/22/2023, 10:11 AM [place]ard SQUIBB, PT Acute Rehabilitation Services Office: [phone]

## 2023-09-22 NOTE — Progress Notes (Addendum)
 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  BNP elevated to 1321.  CXR with increased mild left and similarly reaccumulated right pleural effusion and cardiomegaly.  UDS positive for cocaine.  Received IV Lasix .  Nephrology consulted.  Patient underwent HD with ultrafiltration of 4 L overnight.  Came off BiPAP.   Subjective: -Left AMA after hospitalization for 12 days on 7/4, irritable this morning, does not know if she will be dialyzed today, has aches and pains everywhere  Objective: Vitals:   09/22/23 0358 09/22/23 0724 09/22/23 0910 09/22/23 0935  BP: 129/68 (!) 130/54  (!) 135/57  Pulse: 77 65 80 70  Resp: 17 17 (!) 22 15  Temp: 98.2 F (36.8 C) 98.1 F (36.7 C)    TempSrc: Oral Oral    SpO2: 99% 98% (!) 72% 90%  Weight: 59.1 kg       Examination:  Chronically ill female sitting up in bed, AO x 3 HEENT: No JVD CVS: S1-S2, regular rhythm Lungs: Poor air movement bilaterally, few rhonchi Abdomen: Soft, nontender, bowel sounds present Extremities: No edema  Consultants:  Nephrology  Procedures: Hemodialysis  Microbiology summarized: None  Assessment and plan: Acute on chronic respiratory failure with hypoxia due to fluid overload in the setting of ESRD and COPD: Presents with respiratory distress and required BiPAP in ED.  CXR with reaccumulation of right pleural effusion -S/p HD with ultrafiltration of 4 L on admission - sp thoracentesis  per IR 7/6 - Continue home Breztri  - Continue DuoNebs, incentive spirometry, out of bed to chair  ESRD/volume overload: no outpatient dialysis chair due to homelessness. Bone mineral disorder -Per nephrology.  Anticipate HD today  Chronic COPD:Chronic smoker. - Inhalers and nebulizers as above - Hold off systemic steroid given history of PUD and GI bleed unless worse.  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 Small bowel AVMs and PUD - Continue Protonix  - Add IV iron    Acute on chronic diastolic CHF: Likely due to ESRD.  TTE in 01/2023 with LVEF of 50 to 55%.  - Fluid management by HD per nephrology   Right pleural effusion: Likely due to CHF and ESRD. -sp thoracentesis by per IR -Fluid management by HD, limited by compliance   Hyponatremia: Likely due to ESRD.  Improved. - Continue monitoring   Essential hypertension: Normotensive -Continue Coreg  -Continue holding amlodipine    Anxiety and depression: -Continue sertraline , trazodone .   Hyperlipidemia: -Continue Lipitor   Polysubstance abuse: Including cocaine and tobacco.  UDS positive for cocaine but she denies using cocaine since she left the hospital.  -Continue nicotine  patch -Encouraged cessation -TOC consulted   Homelessness: Complicating care. -TOC consult   Body mass index is 20.41 kg/m.     DVT prophylaxis:  heparin  injection 5,000 Units Start: 09/20/23 1500 Place and maintain sequential compression device Start: 09/20/23 1153  Code Status: DNR-Limited Family Communication: None at bedside Disposition: Unclear  55 minutes with more than 50% spent in  reviewing records, counseling patient/family and coordinating care.   Sch Meds:  Scheduled Meds:  (feeding supplement) PROSource Plus  30 mL Oral BID BM   atorvastatin   10 mg Oral Daily   budesonide -glycopyrrolate -formoterol   2 puff Inhalation BID   calcitRIOL   0.25 mcg Oral Daily   carvedilol   6.25 mg Oral BID WC    Chlorhexidine  Gluconate Cloth  6 each Topical Q0600   [START ON 09/23/2023] darbepoetin (ARANESP ) injection - DIALYSIS  200 mcg Subcutaneous Q Tue-1800   feeding supplement (NEPRO CARB STEADY)  237 mL Oral BID BM   ferrous sulfate   325 mg Oral Q breakfast   gabapentin   100 mg Oral TID   heparin   5,000 Units Subcutaneous Q8H   lidocaine   1 patch Transdermal Q24H   lidocaine  (PF)  8 mL Intradermal Once   multivitamin  1 tablet Oral QHS   nicotine   21 mg Transdermal Daily   pantoprazole   40 mg Oral BID   sertraline   25 mg Oral Daily   sodium bicarbonate   1,300 mg Oral BID   sodium chloride  flush  3 mL Intravenous Q12H   torsemide   100 mg Oral Daily   Continuous Infusions: PRN Meds:.acetaminophen  **OR** acetaminophen , ipratropium-albuterol , naLOXone  (NARCAN )  injection, traZODone   Antimicrobials: Anti-infectives (From admission, onward)    None        I have personally reviewed the following labs and images: CBC: Recent Labs  Lab 09/18/23 0653 09/19/23 0559 09/20/23 0804 09/21/23 0324 09/22/23 0421  WBC 5.0 4.9 7.9 5.8 5.2  NEUTROABS  --   --  6.0  --   --   HGB 6.8* 7.9* 9.0* 7.3* 7.3*  HCT 21.7* 24.3* 28.3* 23.6* 23.8*  MCV 94.8 91.7 91.9 94.4 95.6  PLT 297 286 369 313 307   BMP &GFR Recent Labs  Lab 09/17/23 0639 09/18/23 0653 09/19/23 0559 09/20/23 0804 09/21/23 0324 09/22/23 0421  NA 126* 124* 131* 131* 135 131*  K 3.8 4.1 3.8 4.0 4.0 4.5  CL 87* 87* 92* 91* 96* 91*  CO2 26 26 28 27 30 27   GLUCOSE 99 94 92 100* 100* 87  BUN 39* 52* 24* 37* 22 41*  CREATININE 3.32* 4.09* 2.25* 3.18* 2.11* 3.22*  CALCIUM  7.8* 8.2* 8.3* 8.9 8.3* 8.5*  MG  --   --  2.0  --   --  2.0  PHOS 4.8* 5.7* 3.3  --  3.3 3.6   Estimated Creatinine Clearance: 16.7 mL/min (A) (by C-G formula based on SCr of 3.22 mg/dL (H)). Liver & Pancreas: Recent Labs  Lab 09/17/23 9360 09/18/23 0653 09/19/23 0559 09/21/23 0324 09/22/23 0421  ALBUMIN  2.1* 2.1* 2.1* 2.1* 2.3*   No results  for input(s): LIPASE, AMYLASE in the last 168 hours. No results for input(s): AMMONIA in the last 168 hours. Diabetic: No results for input(s): HGBA1C in the last 72 hours. Recent Labs  Lab 09/16/23 2246  GLUCAP 125*   Cardiac Enzymes: No results for input(s): CKTOTAL, CKMB, CKMBINDEX, TROPONINI in the last 168 hours. No results for input(s): PROBNP in the last 8760 hours. Coagulation Profile: No results for input(s): INR, PROTIME in the last 168 hours. Thyroid Function Tests: No results for input(s): TSH, T4TOTAL, FREET4, T3FREE, THYROIDAB in the last 72 hours. Lipid Profile: No results for input(s): CHOL, HDL, LDLCALC, TRIG, CHOLHDL, LDLDIRECT in the last 72 hours. Anemia Panel: Recent Labs    09/22/23 0421  FERRITIN 244  TIBC 297  IRON  27*   Urine analysis:  Component Value Date/Time   COLORURINE YELLOW 09/20/2023 0831   APPEARANCEUR HAZY (A) 09/20/2023 0831   LABSPEC 1.005 09/20/2023 0831   PHURINE 7.0 09/20/2023 0831   GLUCOSEU NEGATIVE 09/20/2023 0831   HGBUR SMALL (A) 09/20/2023 0831   BILIRUBINUR NEGATIVE 09/20/2023 0831   KETONESUR NEGATIVE 09/20/2023 0831   PROTEINUR 100 (A) 09/20/2023 0831   UROBILINOGEN 0.2 03/02/2010 2027   NITRITE NEGATIVE 09/20/2023 0831   LEUKOCYTESUR LARGE (A) 09/20/2023 0831   Sepsis Labs: Invalid input(s): PROCALCITONIN, LACTICIDVEN  Microbiology: No results found for this or any previous visit (from the past 240 hours).  Radiology Studies: US  THORACENTESIS ASP PLEURAL SPACE W/IMG GUIDE Result Date: 09/21/2023 INDICATION: 63 year old female with history of ESRD and recurrent pleural effusion. Previous imaging show bilateral pleural effusion. Request for therapeutic thoracentesis. EXAM: ULTRASOUND GUIDED LEFT THORACENTESIS MEDICATIONS: 7 mL 1% lidocaine  COMPLICATIONS: None immediate. PROCEDURE: An ultrasound guided thoracentesis was thoroughly discussed with the patient and questions  answered. The benefits, risks, alternatives and complications were also discussed. The patient understands and wishes to proceed with the procedure. Written consent was obtained. The right pleural space was visualized with ultrasound which show densely loculated pleural effusion without large pocket of fluid. Ultrasound was performed to localize and mark an adequate pocket of fluid in the left chest. The area was then prepped and draped in the normal sterile fashion. 1% Lidocaine  was used for local anesthesia. Under ultrasound guidance a 6 Fr Safe-T-Centesis catheter was introduced. Thoracentesis was performed. The catheter was removed and a dressing applied. FINDINGS: A total of approximately 550 mL of hazy yellow fluid was removed. Post procedure chest X-ray reviewed, negative for pneumothorax. IMPRESSION: Successful ultrasound guided left thoracentesis yielding 550 mL of pleural fluid. Performed by: Toya Cousin, PA-C Electronically Signed   By: Cordella Banner   On: 09/21/2023 16:07   DG Chest 1 View Result Date: 09/21/2023 CLINICAL DATA:  63 year old female status post left side ultrasound-guided thoracentesis this morning. Dialysis patient. EXAM: CHEST  1 VIEW COMPARISON:  Thoracentesis images 10 47 hours today. Portable chest yesterday. FINDINGS: Portable AP semi upright view at 1136 hours. Stable lung volumes. Stable cardiac size and mediastinal contours. Significantly regressed left lung base pleural effusion from the chest x-ray yesterday. No pneumothorax identified. Stable right lung base and peripheral right lung opacity compatible with at least partially loculated effusion, associated airspace disease. No areas of worsening ventilation. IMPRESSION: 1. Regressed left pleural effusion post thoracentesis with no pneumothorax. 2. Stable right lung base opacity compatible with more loculated effusion and associated airspace disease. Electronically Signed   By: VEAR Hurst M.D.   On: 09/21/2023 11:57       [facility]  Hospitalist  If 7PM-7AM, please contact night-coverage [email or web address] 09/22/2023, 10:09 AM

## 2023-09-22 NOTE — Progress Notes (Signed)
 Patient request IV team to place IV in order to receive IV iron . MD made aware. IV team order requested.

## 2023-09-22 NOTE — Progress Notes (Addendum)
 Patient asking for pain medication, asking for oxycontin . RN relayed to patient that the patient only has tylenol  as an active order. Patient told RN to go get the order. MD paged to notify.

## 2023-09-22 NOTE — Progress Notes (Signed)
 [facility] Progress Note  Subjective:  Seen in room, no c/o's  Vitals:   09/22/23 0935 09/22/23 1100 09/22/23 1136 09/22/23 1240  BP: (!) 135/57  (!) 147/59   Pulse: 70 70 70 86  Resp: 15  13 14   Temp:   98.8 F (37.1 C)   TempSrc:   Oral   SpO2: 90% 100% 99% 96%  Weight:        Exam: General: Chronically ill appearing female looks older than stated age.  Neuro: Oriented to place and person. No asterixis Heart:S1,S2 No M/R/G Lungs: Coarse rhonchi scattered upper lung fields. . Dry hacking cough. O2 via . No WOB.  Abdomen: NABS, NT Extremities: No LE edema Dialysis Access: AVF + T/B   OP HD: No OP Clinic 3- 3.5 hours  2.0 K  BFR 400   AVF UF 1-3 L      Assessment/ Plan: Volume overload: HD 09/20/2023 net UF 4L Volume status improved. Lower volume as tolerated. Continue daily Torsemide  Small L and Re-accumulated R. Pleural Effusion-S/P thoracentesis 06/30. Management per primary ESRD - No OP HD clinic. Using T,Th,S schedule while in hospital. . Next HD 07/08.  Anemia - HGB 9.0 admission, now Hb 7-8 range.  ESA 200 mcg weekly sq ordered. Tsat 9%, ferritin 244. IV Fe ordered (venofer ) but not given. Will reorder. Transfuse if HGB < 7.0 5. HTN/volume - As noted above. BP is actually controlled. Continue OP medications.  COPD-continues to smoke. Per primary Metabolic acidosis: Continue NAHCO3 tabs as she periodically refuses to come to HD.      Myer Fret MD  [facility] 09/22/2023, 3:57 PM  Recent Labs  Lab 09/21/23 0324 09/22/23 0421  HGB 7.3* 7.3*  ALBUMIN  2.1* 2.3*  CALCIUM  8.3* 8.5*  PHOS 3.3 3.6  CREATININE 2.11* 3.22*  K 4.0 4.5   Recent Labs  Lab 09/22/23 0421  IRON  27*  TIBC 297  FERRITIN 244   Inpatient medications:  (feeding supplement) PROSource Plus  30 mL Oral BID BM   atorvastatin   10 mg Oral Daily   budesonide -glycopyrrolate -formoterol   2 puff Inhalation BID   calcitRIOL   0.25 mcg Oral Daily   carvedilol   6.25 mg Oral BID WC    Chlorhexidine  Gluconate Cloth  6 each Topical Q0600   [START ON 09/23/2023] darbepoetin (ARANESP ) injection - DIALYSIS  200 mcg Subcutaneous Q Tue-1800   feeding supplement (NEPRO CARB STEADY)  237 mL Oral BID BM   ferrous sulfate   325 mg Oral Q breakfast   gabapentin   100 mg Oral TID   heparin   5,000 Units Subcutaneous Q8H   lidocaine   1 patch Transdermal Q24H   lidocaine  (PF)  8 mL Intradermal Once   multivitamin  1 tablet Oral QHS   nicotine   21 mg Transdermal Daily   pantoprazole   40 mg Oral BID   sertraline   25 mg Oral Daily   sodium bicarbonate   1,300 mg Oral BID   sodium chloride  flush  3 mL Intravenous Q12H   torsemide   100 mg Oral Daily    iron  sucrose     acetaminophen  **OR** acetaminophen , ipratropium-albuterol , naLOXone  (NARCAN )  injection, oxyCODONE , traZODone 

## 2023-09-22 NOTE — Plan of Care (Signed)
   Problem: Health Behavior/Discharge Planning: Goal: Ability to manage health-related needs will improve Outcome: Progressing   Problem: Clinical Measurements: Goal: Ability to maintain clinical measurements within normal limits will improve Outcome: Progressing Goal: Diagnostic test results will improve Outcome: Progressing

## 2023-09-22 NOTE — Progress Notes (Signed)
 OT Cancellation Note  Patient Details Name: [name] MRN: [id number] DOB: 27-Nov-1960   Cancelled Treatment:    Reason Eval/Treat Not Completed: OT screened, no needs identified, will sign off Discussed with PT and nursing. Pt Independent in room for ADLs/mobility, recently evaluated and discharged by OT on 7/4 with prior admission with no apparent changes in function. Encourage OOB activity with mobility specialists while admitted.  [name] 09/22/2023, 12:16 PM

## 2023-09-23 LAB — RENAL FUNCTION PANEL
Albumin: 2.2 g/dL — ABNORMAL LOW (ref 3.5–5.0)
Anion gap: 12 (ref 5–15)
BUN: 53 mg/dL — ABNORMAL HIGH (ref 8–23)
CO2: 25 mmol/L (ref 22–32)
Calcium: 8.1 mg/dL — ABNORMAL LOW (ref 8.9–10.3)
Chloride: 91 mmol/L — ABNORMAL LOW (ref 98–111)
Creatinine, Ser: 3.73 mg/dL — ABNORMAL HIGH (ref 0.44–1.00)
GFR, Estimated: 13 mL/min — ABNORMAL LOW (ref >60)
Glucose, Bld: 105 mg/dL — ABNORMAL HIGH (ref 70–99)
Phosphorus: 3.9 mg/dL (ref 2.5–4.6)
Potassium: 4.1 mmol/L (ref 3.5–5.1)
Sodium: 128 mmol/L — ABNORMAL LOW (ref 135–145)

## 2023-09-23 MED ORDER — ACETAMINOPHEN 325 MG PO TABS
ORAL_TABLET | ORAL | Status: AC
  Filled 2023-09-23: qty 2

## 2023-09-23 MED ORDER — ANTICOAGULANT SODIUM CITRATE 4% (200MG/5ML) IV SOLN
5.0000 mL | Status: DC | PRN
Start: 2023-09-23 — End: 2023-09-23

## 2023-09-23 MED ORDER — LIDOCAINE-PRILOCAINE 2.5-2.5 % EX CREA: 1.0000 | TOPICAL_CREAM | CUTANEOUS | Status: DC | PRN

## 2023-09-23 MED ORDER — SODIUM CHLORIDE 0.9 % IV SOLN
500.0000 mg | INTRAVENOUS | Status: DC
  Administered 2023-09-23: 500 mg via INTRAVENOUS
  Filled 2023-09-23: qty 25

## 2023-09-23 MED ORDER — ALTEPLASE 2 MG IJ SOLR: 2.0000 mg | Freq: Once | INTRAMUSCULAR | Status: DC | PRN

## 2023-09-23 MED ORDER — PENTAFLUOROPROP-TETRAFLUOROETH EX AERO
INHALATION_SPRAY | CUTANEOUS | Status: AC
  Filled 2023-09-23: qty 30

## 2023-09-23 MED ORDER — PENTAFLUOROPROP-TETRAFLUOROETH EX AERO: 1.0000 | INHALATION_SPRAY | CUTANEOUS | Status: DC | PRN

## 2023-09-23 MED ORDER — HEPARIN SODIUM (PORCINE) 1000 UNIT/ML DIALYSIS
2000.0000 [IU] | Freq: Once | INTRAMUSCULAR | Status: DC
  Filled 2023-09-23: qty 2

## 2023-09-23 MED ORDER — HEPARIN SODIUM (PORCINE) 1000 UNIT/ML DIALYSIS: 2000.0000 [IU] | INTRAMUSCULAR | Status: DC | PRN

## 2023-09-23 MED ORDER — NEPRO/CARBSTEADY PO LIQD
237.0000 mL | ORAL | Status: DC | PRN
Start: 2023-09-23 — End: 2023-09-23

## 2023-09-23 MED ORDER — HEPARIN SODIUM (PORCINE) 1000 UNIT/ML DIALYSIS
1000.0000 [IU] | INTRAMUSCULAR | Status: DC | PRN
Start: 2023-09-23 — End: 2023-09-23
  Administered 2023-09-23: 1000 [IU]
  Filled 2023-09-23: qty 1

## 2023-09-23 MED ORDER — HEPARIN SODIUM (PORCINE) 1000 UNIT/ML IJ SOLN
INTRAMUSCULAR | Status: AC
  Filled 2023-09-23: qty 2

## 2023-09-23 MED ORDER — LIDOCAINE HCL (PF) 1 % IJ SOLN
5.0000 mL | INTRAMUSCULAR | Status: DC | PRN
Start: 2023-09-23 — End: 2023-09-23

## 2023-09-23 NOTE — Progress Notes (Signed)
 Received patient from Hemodialysis, very irritable demanding  for her belongings and her spaghetti that she ordered earlier from kitchen. Patient refused skin check, sacral foam noted with old drainage stain. Patient also refused mrsa pcr swab.

## 2023-09-23 NOTE — Progress Notes (Signed)
 [facility] Progress Note  Subjective:  Seen in room Main c/o is persistent abd swelling and LE/ hip / flank edema  Vitals:   09/23/23 0527 09/23/23 0735 09/23/23 0752 09/23/23 1100  BP: (!) 128/59 131/70  137/64  Pulse: 72 83  72  Resp: 12 19  20   Temp: 98.3 F (36.8 C) 97.7 F (36.5 C)  98.9 F (37.2 C)  TempSrc: Oral Oral  Oral  SpO2: 96% 99% 100% 98%  Weight: 61.4 kg       Exam: General: Chronically ill appearing female looks older than stated age.  Neuro: Oriented to place and person. No asterixis Heart:S1,S2 No M/R/G Lungs: Coarse rhonchi scattered upper lung fields. . Dry hacking cough. O2 via Amanda. No WOB.  Abdomen: NABS, NT, 2+ ascites Extremities: 1-2+ bilat LE / hip edema  Dialysis Access: AVF + T/B   OP HD: No OP Clinic 3- 3.5 hours  2.0 K  BFR 400   AVF Dry wt not clear UF 1-3 L      Assessment/ Plan: Volume overload: SP HD x 3 last week w/ 10 L off, no drop in wts though. Will reinforce fluid restriction of 1200 cc/d.  Pleural Effusion-S/P thoracentesis 06/30. Management per primary Ascites: not sure if has had paracentesis ever ESRD - No OP HD clinic, using TTS schedule here. HD today.  Anemia - HGB 9.0 admission, now Hb 7-8 range.  ESA 200 mcg weekly sq ordered. Tsat 9%, ferritin 244. IV Fe ordered (venofer ) but not given. Reordered venofer  500mg  weekly x 2. Transfuse if HGB < 7.0 HTN: BP stable on coreg , torsemide     Myer Fret MD  [facility] 09/23/2023, 11:33 AM  Recent Labs  Lab 09/21/23 0324 09/22/23 0421 09/23/23 0248  HGB 7.3* 7.3*  --   ALBUMIN  2.1* 2.3* 2.2*  CALCIUM  8.3* 8.5* 8.1*  PHOS 3.3 3.6 3.9  CREATININE 2.11* 3.22* 3.73*  K 4.0 4.5 4.1   Recent Labs  Lab 09/22/23 0421  IRON  27*  TIBC 297  FERRITIN 244   Inpatient medications:  (feeding supplement) PROSource Plus  30 mL Oral BID BM   atorvastatin   10 mg Oral Daily   budesonide -glycopyrrolate -formoterol   2 puff Inhalation BID   calcitRIOL   0.25 mcg Oral Daily    carvedilol   6.25 mg Oral BID WC   Chlorhexidine  Gluconate Cloth  6 each Topical Q0600   darbepoetin (ARANESP ) injection - DIALYSIS  200 mcg Subcutaneous Q Tue-1800   feeding supplement (NEPRO CARB STEADY)  237 mL Oral BID BM   ferrous sulfate   325 mg Oral Q breakfast   gabapentin   100 mg Oral TID   heparin   5,000 Units Subcutaneous Q8H   lidocaine   1 patch Transdermal Q24H   lidocaine  (PF)  8 mL Intradermal Once   multivitamin  1 tablet Oral QHS   nicotine   21 mg Transdermal Daily   pantoprazole   40 mg Oral BID   sertraline   25 mg Oral Daily   sodium bicarbonate   1,300 mg Oral BID   sodium chloride  flush  3 mL Intravenous Q12H   torsemide   100 mg Oral Daily    iron  sucrose     acetaminophen  **OR** acetaminophen , ipratropium-albuterol , naLOXone  (NARCAN )  injection, oxyCODONE , traZODone 
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
## 2023-09-23 NOTE — Plan of Care (Signed)
  Problem: Clinical Measurements: Goal: Ability to maintain clinical measurements within normal limits will improve Outcome: Progressing Goal: Respiratory complications will improve Outcome: Progressing   Problem: Activity: Goal: Risk for activity intolerance will decrease Outcome: Progressing   

## 2023-09-23 NOTE — TOC Progression Note (Addendum)
 Transition of Care [facility]) - Progression Note    Patient Details  Name: [name] MRN: [id number] Date of Birth: 1960-09-29  Transition of Care [facility]) CM/SW Contact  Waddell Barnie Rama, RN Phone Number: 09/23/2023, 2:57 PM  Clinical Narrative:    NCM spoke with patient, she states she is homeless, she did not have any oxygen prior to admission, she states she does not have a Dialysis unit and she has to go to the Emergency room to get her HD done.  She states she gets a widow pension check around 1200.00 and when she goes to the hotel it takes it all of her check.  She will have to be weaned off the oxygen before she is dc.  [facility] but they stay full.  She will try to get into [facility] per CSW..     Expected Discharge Plan: Homeless Shelter Barriers to Discharge: Continued Medical Work up  Expected Discharge Plan and Services   Discharge Planning Services: CM Consult   Living arrangements for the past 2 months: Homeless                                       Social Determinants of Health (SDOH) Interventions SDOH Screenings   Food Insecurity: Food Insecurity Present (09/23/2023)  Housing: High Risk (09/23/2023)  Transportation Needs: Unmet Transportation Needs (09/23/2023)  Utilities: Patient Unable To Answer (09/23/2023)  Recent Concern: Utilities - At Risk (07/25/2023)  Alcohol  Screen: Low Risk  (02/10/2023)  Depression (PHQ2-9): Medium Risk (06/11/2023)  Financial Resource Strain: High Risk (02/10/2023)  Physical Activity: Inactive (02/10/2023)  Social Connections: Socially Isolated (06/12/2023)  Tobacco Use: High Risk (09/20/2023)  Health Literacy: Adequate Health Literacy (02/10/2023)    Readmission Risk Interventions    07/01/2023   12:39 PM 06/16/2023    4:04 PM 01/28/2023   10:32 AM  Readmission Risk Prevention Plan  Transportation Screening Complete Complete Complete  Medication Review (RN  Care Manager) Referral to Pharmacy Complete Complete  PCP or Specialist appointment within 3-5 days of discharge Complete Complete   HRI or Home Care Consult Complete Complete   SW Recovery Care/Counseling Consult Complete Complete   Palliative Care Screening Not Applicable Not Applicable   Skilled Nursing Facility Not Applicable Not Applicable

## 2023-09-23 NOTE — Progress Notes (Signed)
 PROGRESS NOTE  [name] FMW:[id number] DOB: [birth year]   PCP: Vicci Barnie NOVAK, MD  Patient is from: Homeless Chief complaints Chief Complaint  Patient presents with   Shortness of Breath     Brief Narrative / Interim history: 63 year old F with PMH of ESRD not on HD due to homelessness, polysubstance use including cocaine and tobacco, chronic GI bleed due to AVMs, PUD, COPD, HTN, GERD and chronic leg and back pain return to ED with shortness of breath after she left AMA a day prior, and admitted with fluid overload and respiratory distress requiring BiPAP.  BNP elevated to 1321.  CXR with increased mild left and similarly reaccumulated right pleural effusion and cardiomegaly.  UDS positive for cocaine. Nephrology consulted. Patient underwent HD with ultrafiltration of 4 L overnight.  Came off BiPAP.  - Underwent thoracentesis - Slowly improving, disposition remains a challenge  Subjective: - Complains of chronic pain everywhere, asking for her oxycodone , upset that she did not get dialyzed yesterday  Objective: Vitals:   09/23/23 0527 09/23/23 0735 09/23/23 0752 09/23/23 1100  BP: (!) 128/59 131/70  137/64  Pulse: 72 83  72  Resp: 12 19  20   Temp: 98.3 F (36.8 C) 97.7 F (36.5 C)  98.9 F (37.2 C)  TempSrc: Oral Oral  Oral  SpO2: 96% 99% 100% 98%  Weight: 61.4 kg       Examination:  Chronically ill, AAO x 3, no distress HEENT: Positive JVD CVS: S1-S2, regular rhythm Lungs: Poor air movement bilaterally, decreased at the bases, few scattered rhonchi Abdomen: Soft, nontender, bowel sounds present Extremities: No edema  Consultants:  Nephrology  Procedures: Hemodialysis  Microbiology summarized: None  Assessment and plan: Acute on chronic respiratory failure with hypoxia due to fluid overload in the setting of ESRD and COPD: Presents with respiratory distress and required BiPAP in ED.  CXR with reaccumulation of left pleural effusion and chronic right  base opacity -S/p HD with ultrafiltration of 4 L on admission - sp left thoracentesis per IR 7/6 - Continue home Breztri  - Continue DuoNebs, incentive spirometry, out of bed to chair  ESRD/volume overload: no outpatient dialysis chair due to homelessness. Bone mineral disorder -Per nephrology.  HD today  Chronic COPD:Chronic smoker. - Inhalers and nebulizers as above - Hold off systemic steroid given history of PUD and GI bleed unless worse.  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 Small bowel AVMs and PUD - Continue Protonix  - Given IV iron    Acute on chronic diastolic CHF: Likely due to ESRD.  TTE in 01/2023 with LVEF of 50 to 55%.  - Fluid management by HD per nephrology   Left pleural effusion: Likely due to CHF and ESRD. -sp thoracentesis by per IR -Fluid management by HD, limited by compliance -Has chronic right basilar opacity stable from prior   Hyponatremia: Likely due to ESRD.  Improved. - Continue monitoring   Essential hypertension: Normotensive -Continue Coreg  -Continue holding amlodipine    Anxiety and depression: -Continue sertraline , trazodone .   Hyperlipidemia: -Continue Lipitor   Polysubstance abuse: Including cocaine and tobacco.  UDS positive for cocaine but she denies using cocaine since she left the hospital.  -Continue nicotine  patch -Encouraged cessation -TOC consulted   Homelessness: Complicating care. -TOC consult   Body mass index is 21.2 kg/m.     DVT prophylaxis:  heparin  injection 5,000 Units Start: 09/20/23 1500 Place and maintain sequential compression device Start: 09/20/23 1153  Code Status: DNR-Limited Family Communication: None at bedside  Disposition: Unclear  55 minutes with more than 50% spent in reviewing records, counseling patient/family and coordinating care.   Sch Meds:  Scheduled Meds:  (feeding supplement) PROSource Plus  30 mL Oral BID BM   atorvastatin   10 mg Oral Daily    budesonide -glycopyrrolate -formoterol   2 puff Inhalation BID   calcitRIOL   0.25 mcg Oral Daily   carvedilol   6.25 mg Oral BID WC   Chlorhexidine  Gluconate Cloth  6 each Topical Q0600   darbepoetin (ARANESP ) injection - DIALYSIS  200 mcg Subcutaneous Q Tue-1800   feeding supplement (NEPRO CARB STEADY)  237 mL Oral BID BM   ferrous sulfate   325 mg Oral Q breakfast   gabapentin   100 mg Oral TID   heparin   5,000 Units Subcutaneous Q8H   lidocaine   1 patch Transdermal Q24H   lidocaine  (PF)  8 mL Intradermal Once   multivitamin  1 tablet Oral QHS   nicotine   21 mg Transdermal Daily   pantoprazole   40 mg Oral BID   sertraline   25 mg Oral Daily   sodium bicarbonate   1,300 mg Oral BID   sodium chloride  flush  3 mL Intravenous Q12H   torsemide   100 mg Oral Daily   Continuous Infusions:  iron  sucrose     PRN Meds:.acetaminophen  **OR** acetaminophen , ipratropium-albuterol , naLOXone  (NARCAN )  injection, oxyCODONE , traZODone   Antimicrobials: Anti-infectives (From admission, onward)    None        I have personally reviewed the following labs and images: CBC: Recent Labs  Lab 09/18/23 0653 09/19/23 0559 09/20/23 0804 09/21/23 0324 09/22/23 0421  WBC 5.0 4.9 7.9 5.8 5.2  NEUTROABS  --   --  6.0  --   --   HGB 6.8* 7.9* 9.0* 7.3* 7.3*  HCT 21.7* 24.3* 28.3* 23.6* 23.8*  MCV 94.8 91.7 91.9 94.4 95.6  PLT 297 286 369 313 307   BMP &GFR Recent Labs  Lab 09/18/23 0653 09/19/23 0559 09/20/23 0804 09/21/23 0324 09/22/23 0421 09/23/23 0248  NA 124* 131* 131* 135 131* 128*  K 4.1 3.8 4.0 4.0 4.5 4.1  CL 87* 92* 91* 96* 91* 91*  CO2 26 28 27 30 27 25   GLUCOSE 94 92 100* 100* 87 105*  BUN 52* 24* 37* 22 41* 53*  CREATININE 4.09* 2.25* 3.18* 2.11* 3.22* 3.73*  CALCIUM  8.2* 8.3* 8.9 8.3* 8.5* 8.1*  MG  --  2.0  --   --  2.0  --   PHOS 5.7* 3.3  --  3.3 3.6 3.9   Estimated Creatinine Clearance: 15 mL/min (A) (by C-G formula based on SCr of 3.73 mg/dL (H)). Liver &  Pancreas: Recent Labs  Lab 09/18/23 0653 09/19/23 0559 09/21/23 0324 09/22/23 0421 09/23/23 0248  ALBUMIN  2.1* 2.1* 2.1* 2.3* 2.2*   No results for input(s): LIPASE, AMYLASE in the last 168 hours. No results for input(s): AMMONIA in the last 168 hours. Diabetic: No results for input(s): HGBA1C in the last 72 hours. Recent Labs  Lab 09/16/23 2246  GLUCAP 125*   Cardiac Enzymes: No results for input(s): CKTOTAL, CKMB, CKMBINDEX, TROPONINI in the last 168 hours. No results for input(s): PROBNP in the last 8760 hours. Coagulation Profile: No results for input(s): INR, PROTIME in the last 168 hours. Thyroid Function Tests: No results for input(s): TSH, T4TOTAL, FREET4, T3FREE, THYROIDAB in the last 72 hours. Lipid Profile: No results for input(s): CHOL, HDL, LDLCALC, TRIG, CHOLHDL, LDLDIRECT in the last 72 hours. Anemia Panel: Recent Labs    09/22/23 0421  FERRITIN 244  TIBC 297  IRON  27*   Urine analysis:    Component Value Date/Time   COLORURINE YELLOW 09/20/2023 0831   APPEARANCEUR HAZY (A) 09/20/2023 0831   LABSPEC 1.005 09/20/2023 0831   PHURINE 7.0 09/20/2023 0831   GLUCOSEU NEGATIVE 09/20/2023 0831   HGBUR SMALL (A) 09/20/2023 0831   BILIRUBINUR NEGATIVE 09/20/2023 0831   KETONESUR NEGATIVE 09/20/2023 0831   PROTEINUR 100 (A) 09/20/2023 0831   UROBILINOGEN 0.2 03/02/2010 2027   NITRITE NEGATIVE 09/20/2023 0831   LEUKOCYTESUR LARGE (A) 09/20/2023 0831   Sepsis Labs: Invalid input(s): PROCALCITONIN, LACTICIDVEN  Microbiology: No results found for this or any previous visit (from the past 240 hours).  Radiology Studies: No results found.    Sigurd Pac, MD  If 7PM-7AM, please contact night-coverage [email or web address] 09/23/2023, 11:45 AM

## 2023-09-23 NOTE — Care Management Important Message (Signed)
 Important Message  Patient Details  Name: SAMIHA DENAPOLI MRN: [id number] Date of Birth: [birth year]   Important Message Given:  Yes - Medicare IM     Jon Cruel 09/23/2023, 2:19 PM

## 2023-09-24 LAB — RENAL FUNCTION PANEL
Albumin: 2.1 g/dL — ABNORMAL LOW (ref 3.5–5.0)
Anion gap: 7 (ref 5–15)
BUN: 28 mg/dL — ABNORMAL HIGH (ref 8–23)
CO2: 29 mmol/L (ref 22–32)
Calcium: 7.9 mg/dL — ABNORMAL LOW (ref 8.9–10.3)
Chloride: 95 mmol/L — ABNORMAL LOW (ref 98–111)
Creatinine, Ser: 2.57 mg/dL — ABNORMAL HIGH (ref 0.44–1.00)
GFR, Estimated: 20 mL/min — ABNORMAL LOW (ref >60)
Glucose, Bld: 93 mg/dL (ref 70–99)
Phosphorus: 3.4 mg/dL (ref 2.5–4.6)
Potassium: 4.3 mmol/L (ref 3.5–5.1)
Sodium: 131 mmol/L — ABNORMAL LOW (ref 135–145)

## 2023-09-24 LAB — HEMOGLOBIN AND HEMATOCRIT, BLOOD
HCT: 26.4 % — ABNORMAL LOW (ref 36.0–46.0)
Hemoglobin: 8.2 g/dL — ABNORMAL LOW (ref 12.0–15.0)

## 2023-09-24 LAB — PREPARE RBC (CROSSMATCH)

## 2023-09-24 MED ORDER — SODIUM CHLORIDE 0.9% IV SOLUTION: Freq: Once | INTRAVENOUS | Status: AC

## 2023-09-24 NOTE — Progress Notes (Signed)
 Progress Note   Patient: [name] FMW:[id number] DOB: 02-04-1961 DOA: 09/20/2023  DOS: the patient was seen and examined on 09/24/2023   Brief hospital course:  63 year old F with PMH of ESRD not on HD due to homelessness, polysubstance use including cocaine and tobacco, chronic GI bleed due to AVMs, PUD, COPD, HTN, GERD and chronic leg and back pain return to ED with shortness of breath after she left AMA a day prior, and admitted with fluid overload and respiratory distress requiring BiPAP. BNP elevated to 1321.   Assessment and Plan:  Acute hypoxic respiratory failure - Secondary to volume overload.  Initially requiring nasal cannula.  Appears to be on room air at this time.  Appears resolved.  Acute on chronic HFpEF secondary to volume overload - Volume removal per nephrology.  -6.5 L net negative so far.  Showing improvement.  ESRD with noncompliance - No outpatient dialysis chair due to homelessness.  Markedly volume overloaded on presentation, improving.  HD per nephrology.  Disposition difficult.  Acute on chronic normocytic anemia - Anemia of chronic kidney disease.  Hemoglobin less than 7 this morning.  1 unit PRBC ordered.  History of small bowel AVMs/PUD.  Protonix  on board.  IV iron  given.  Left pleural effusion - Likely secondary to HFpEF/ESRD/volume overload.  Status post thoracentesis by IR.  Chronic hyponatremia - Secondary to ESRD.  Nephrology on board.  Hypertension - Normotensive.  Coreg  on board.  Polysubstance abuse - Cocaine positive on presentation.  Nicotine  patch on board.  Encouraging cessation.  Homelessness - Complicating care.  TOC involved.  Goals of care - Disposition difficult given hemodialysis and homelessness.  Eventually will likely be discharged back to community[facility].    Subjective: Patient resting comfortably this morning.  States she is in pain, feels her sides and legs are sensitive to pain.  Awaiting  her unit of blood this morning.  Denies any fever, purulent sputum, nausea, vomiting, chest pain, abdominal pain.  Physical Exam:  Vitals:   09/24/23 0456 09/24/23 0658 09/24/23 0928 09/24/23 1000  BP: (!) 162/68 (!) 154/68 (!) 134/55 130/70  Pulse: 67 74 69 69  Resp: 20 18 16 18   Temp: 97.8 F (36.6 C) 98 F (36.7 C) 97.9 F (36.6 C) 97.9 F (36.6 C)  TempSrc: Oral Oral Oral   SpO2: 100% 100% 99%   Weight:        GENERAL:  Alert, pleasant, no acute distress  HEENT:  EOMI CARDIOVASCULAR:  RRR, no murmurs appreciated RESPIRATORY:  Clear to auscultation, no wheezing, rales, or rhonchi GASTROINTESTINAL:  Soft, nontender, nondistended EXTREMITIES: Mild BL LE pitting edema NEURO:  No new focal deficits appreciated SKIN:  No rashes noted PSYCH:  Appropriate mood and affect     Data Reviewed:  No new imaging to review  Previous records (including but not limited to H&P, progress notes, nursing notes, TOC management) were reviewed in assessment of this patient.  Labs: CBC: Recent Labs  Lab 09/19/23 0559 09/20/23 0804 09/21/23 0324 09/22/23 0421 09/24/23 0431  WBC 4.9 7.9 5.8 5.2 6.0  NEUTROABS  --  6.0  --   --   --   HGB 7.9* 9.0* 7.3* 7.3* 6.8*  HCT 24.3* 28.3* 23.6* 23.8* 21.8*  MCV 91.7 91.9 94.4 95.6 95.6  PLT 286 369 313 307 319   Basic Metabolic Panel: Recent Labs  Lab 09/19/23 0559 09/20/23 0804 09/21/23 0324 09/22/23 0421 09/23/23 0248 09/24/23 0431  NA 131* 131* 135 131* 128* 131*  K 3.8 4.0 4.0 4.5 4.1 4.3  CL 92* 91* 96* 91* 91* 95*  CO2 28 27 30 27 25 29   GLUCOSE 92 100* 100* 87 105* 93  BUN 24* 37* 22 41* 53* 28*  CREATININE 2.25* 3.18* 2.11* 3.22* 3.73* 2.57*  CALCIUM  8.3* 8.9 8.3* 8.5* 8.1* 7.9*  MG 2.0  --   --  2.0  --   --   PHOS 3.3  --  3.3 3.6 3.9 3.4   Liver Function Tests: Recent Labs  Lab 09/19/23 0559 09/21/23 0324 09/22/23 0421 09/23/23 0248 09/24/23 0431  ALBUMIN  2.1* 2.1* 2.3* 2.2* 2.1*   CBG: No results for  input(s): GLUCAP in the last 168 hours.  Scheduled Meds:  (feeding supplement) PROSource Plus  30 mL Oral BID BM   atorvastatin   10 mg Oral Daily   budesonide -glycopyrrolate -formoterol   2 puff Inhalation BID   calcitRIOL   0.25 mcg Oral Daily   carvedilol   6.25 mg Oral BID WC   Chlorhexidine  Gluconate Cloth  6 each Topical Q0600   darbepoetin (ARANESP ) injection - DIALYSIS  200 mcg Subcutaneous Q Tue-1800   feeding supplement (NEPRO CARB STEADY)  237 mL Oral BID BM   ferrous sulfate   325 mg Oral Q breakfast   gabapentin   100 mg Oral TID   heparin   5,000 Units Subcutaneous Q8H   lidocaine   1 patch Transdermal Q24H   lidocaine  (PF)  8 mL Intradermal Once   multivitamin  1 tablet Oral QHS   nicotine   21 mg Transdermal Daily   pantoprazole   40 mg Oral BID   sertraline   25 mg Oral Daily   sodium bicarbonate   1,300 mg Oral BID   sodium chloride  flush  3 mL Intravenous Q12H   torsemide   100 mg Oral Daily   Continuous Infusions:  iron  sucrose 500 mg (09/23/23 1447)   PRN Meds:.acetaminophen  **OR** acetaminophen , ipratropium-albuterol , naLOXone  (NARCAN )  injection, oxyCODONE , traZODone   Family Communication: None at bedside  Disposition: Status is: Inpatient Remains inpatient appropriate because: ESRD     Time spent: 40 minutes  Length of inpatient stay: 4 days  Author: Carliss LELON Canales, DO 09/24/2023 11:39 AM  For on call review [email or web address].

## 2023-09-24 NOTE — Progress Notes (Signed)
 Patient's hemoglobin 6.8, Rathore,MD notified via page at 05:55. Awaiting call back.

## 2023-09-24 NOTE — Plan of Care (Signed)
   Problem: Education: Goal: Knowledge of General Education information will improve Description: Including pain rating scale, medication(s)/side effects and non-pharmacologic comfort measures Outcome: Progressing   Problem: Activity: Goal: Risk for activity intolerance will decrease Outcome: Progressing

## 2023-09-24 NOTE — Progress Notes (Signed)
 Notified by RN about critical hemoglobin.  Hemoglobin 6.8 on labs this morning, was 7.3 yesterday.  No overt bleeding overnight and hemodynamically stable.  Type and screen, 1 unit PRBCs ordered after obtaining consent from the patient.  Continue to transfuse if hemoglobin is less than 7.

## 2023-09-24 NOTE — Plan of Care (Signed)

## 2023-09-24 NOTE — Hospital Course (Addendum)
 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 BNP elevated to 1321.    Assessment and Plan:   Acute hypoxic respiratory failure - Secondary to volume overload.  Initially requiring nasal cannula.  Appears to be on room air at this time.  Appears resolved.   Acute on chronic HFpEF secondary to volume overload - Volume removal per nephrology.  -9 L net negative so far.  Showing improvement.   ESRD with noncompliance - No outpatient dialysis chair due to homelessness.  Markedly volume overloaded on presentation, improving.  HD per nephrology.  Disposition difficult.  Pt has poor understanding of her disease and compliance is difficult.     Acute on chronic normocytic anemia - Anemia of chronic kidney disease.  S/P 1 unit PRBC.  History of small bowel AVMs/PUD.  Protonix  on board.  IV iron  given.   BL pleural effusion (POA) - Likely secondary to HFpEF/ESRD/volume overload.  Status post thoracentesis by IR 6/30 and 7/6 (- ).  Will repeat CXR today to reevaluate volume status and residual effusion.    Chronic hyponatremia - Secondary to ESRD.  Nephrology on board.   Hypertension - Normotensive.  Coreg  on board.   Polysubstance abuse - Cocaine positive on presentation.  Nicotine  patch on board.  Encouraging cessation.   Homelessness - Complicating care.  TOC involved.   Goals of care - Disposition difficult given hemodialysis and homelessness.  Eventually will likely be discharged back to community[facility] shelter/[facility].  Pt states she will be able to acquire a phone after discharge.  Anticipate 1-2 days.

## 2023-09-24 NOTE — Progress Notes (Signed)
 [facility] Progress Note   Subjective: Seen in room. Completed dialysis yesterday with 3.5L removed.   Objective Vitals:   09/24/23 0456 09/24/23 0658 09/24/23 0928 09/24/23 1000  BP: (!) 162/68 (!) 154/68 (!) 134/55 130/70  Pulse: 67 74 69 69  Resp: 20 18 16 18   Temp: 97.8 F (36.6 C) 98 F (36.7 C) 97.9 F (36.6 C) 97.9 F (36.6 C)  TempSrc: Oral Oral Oral   SpO2: 100% 100% 99%   Weight:          Additional Objective Labs: Basic Metabolic Panel: Recent Labs  Lab 09/22/23 0421 09/23/23 0248 09/24/23 0431  NA 131* 128* 131*  K 4.5 4.1 4.3  CL 91* 91* 95*  CO2 27 25 29   GLUCOSE 87 105* 93  BUN 41* 53* 28*  CREATININE 3.22* 3.73* 2.57*  CALCIUM  8.5* 8.1* 7.9*  PHOS 3.6 3.9 3.4   CBC: Recent Labs  Lab 09/19/23 0559 09/20/23 0804 09/21/23 0324 09/22/23 0421 09/24/23 0431  WBC 4.9 7.9 5.8 5.2 6.0  NEUTROABS  --  6.0  --   --   --   HGB 7.9* 9.0* 7.3* 7.3* 6.8*  HCT 24.3* 28.3* 23.6* 23.8* 21.8*  MCV 91.7 91.9 94.4 95.6 95.6  PLT 286 369 313 307 319   Blood Culture    Component Value Date/Time   SDES BLOOD LEFT UPPER ARM 06/24/2023 1641   SDES BLOOD SITE NOT SPECIFIED 06/24/2023 1641   SPECREQUEST  06/24/2023 1641    BOTTLES DRAWN AEROBIC AND ANAEROBIC Blood Culture adequate volume   SPECREQUEST  06/24/2023 1641    BOTTLES DRAWN AEROBIC AND ANAEROBIC Blood Culture results may not be optimal due to an inadequate volume of blood received in culture bottles   CULT  06/24/2023 1641    NO GROWTH 5 DAYS Performed at [facility], [address], [place], [place] [address]    CULT  06/24/2023 1641    NO GROWTH 5 DAYS Performed at [facility], [address] [place], [place] [address]    REPTSTATUS 06/29/2023 FINAL 06/24/2023 1641   REPTSTATUS 06/29/2023 FINAL 06/24/2023 1641     Physical Exam General: Alert, chronically ill appearing, nad Heart: RRR Lungs: Diminished, poor air movement  Abdomen: non tender   Extremities: Trace LE edema; hip edema  Dialysis Access: AVF   Medications:  iron  sucrose 500 mg (09/23/23 1447)    (feeding supplement) PROSource Plus  30 mL Oral BID BM   atorvastatin   10 mg Oral Daily   budesonide -glycopyrrolate -formoterol   2 puff Inhalation BID   calcitRIOL   0.25 mcg Oral Daily   carvedilol   6.25 mg Oral BID WC   Chlorhexidine  Gluconate Cloth  6 each Topical Q0600   darbepoetin (ARANESP ) injection - DIALYSIS  200 mcg Subcutaneous Q Tue-1800   feeding supplement (NEPRO CARB STEADY)  237 mL Oral BID BM   ferrous sulfate   325 mg Oral Q breakfast   gabapentin   100 mg Oral TID   heparin   5,000 Units Subcutaneous Q8H   lidocaine   1 patch Transdermal Q24H   lidocaine  (PF)  8 mL Intradermal Once   multivitamin  1 tablet Oral QHS   nicotine   21 mg Transdermal Daily   pantoprazole   40 mg Oral BID   sertraline   25 mg Oral Daily   sodium bicarbonate   1,300 mg Oral BID   sodium chloride  flush  3 mL Intravenous Q12H   torsemide   100 mg Oral Daily    No  OP Clinic 3- 3.5 hours  2.0 K  BFR 400   AVF Dry wt not clear   Assessment/Plan: Volume overload: SP HD x 3 last week w/ 10 L off, no drop in wts though. Will reinforce fluid restriction of 1200 cc/d.  Pleural Effusion-S/P thoracentesis 06/30. Management per primary Ascites: not sure if has had paracentesis ever ESRD - No OP HD clinic, using TTS schedule here.  Anemia. Hbg 7-8s. S/p multiple transfusions since admission.  S/p ESA and IV Fe here. Continue Aranesp  200 q week. Transfuse prn Hgb < 7.  HTN: BP stable on coreg , torsemide  COPD Social issues. Homeless. H/o substance abuse.  CM/SW involved.   Cynthia Ronnald Acosta PA-C [facility] 09/24/2023,11:44 AM

## 2023-09-25 LAB — TYPE AND SCREEN
ABO/RH(D): A POS
Antibody Screen: NEGATIVE
Unit division: 0

## 2023-09-25 LAB — RENAL FUNCTION PANEL
Albumin: 2.3 g/dL — ABNORMAL LOW (ref 3.5–5.0)
Anion gap: 13 (ref 5–15)
BUN: 43 mg/dL — ABNORMAL HIGH (ref 8–23)
CO2: 28 mmol/L (ref 22–32)
Calcium: 9 mg/dL (ref 8.9–10.3)
Chloride: 93 mmol/L — ABNORMAL LOW (ref 98–111)
Creatinine, Ser: 3.41 mg/dL — ABNORMAL HIGH (ref 0.44–1.00)
GFR, Estimated: 15 mL/min — ABNORMAL LOW (ref >60)
Glucose, Bld: 90 mg/dL (ref 70–99)
Phosphorus: 3.9 mg/dL (ref 2.5–4.6)
Potassium: 4.9 mmol/L (ref 3.5–5.1)
Sodium: 134 mmol/L — ABNORMAL LOW (ref 135–145)

## 2023-09-25 LAB — CBC
HCT: 21.8 % — ABNORMAL LOW (ref 36.0–46.0)
HCT: 26.5 % — ABNORMAL LOW (ref 36.0–46.0)
Hemoglobin: 6.8 g/dL — CL (ref 12.0–15.0)
Hemoglobin: 8.1 g/dL — ABNORMAL LOW (ref 12.0–15.0)
MCH: 29.8 pg (ref 26.0–34.0)
MCH: 29.5 pg (ref 26.0–34.0)
MCHC: 31.2 g/dL (ref 30.0–36.0)
MCHC: 30.6 g/dL (ref 30.0–36.0)
MCV: 95.6 fL (ref 80.0–100.0)
MCV: 96.4 fL (ref 80.0–100.0)
Platelets: 319 K/uL (ref 150–400)
Platelets: 234 K/uL (ref 150–400)
RBC: 2.28 MIL/uL — ABNORMAL LOW (ref 3.87–5.11)
RBC: 2.75 MIL/uL — ABNORMAL LOW (ref 3.87–5.11)
RDW: 18.4 % — ABNORMAL HIGH (ref 11.5–15.5)
RDW: 18.3 % — ABNORMAL HIGH (ref 11.5–15.5)
WBC: 6 K/uL (ref 4.0–10.5)
WBC: 7.9 K/uL (ref 4.0–10.5)
nRBC: 0 % (ref 0.0–0.2)
nRBC: 0 % (ref 0.0–0.2)

## 2023-09-25 LAB — BPAM RBC
Blood Product Expiration Date: 202508092359
ISSUE DATE / TIME: 202507090937
Unit Type and Rh: 6200

## 2023-09-25 MED ORDER — ALTEPLASE 2 MG IJ SOLR: 2.0000 mg | Freq: Once | INTRAMUSCULAR | Status: DC | PRN

## 2023-09-25 MED ORDER — PENTAFLUOROPROP-TETRAFLUOROETH EX AERO: 1.0000 | INHALATION_SPRAY | CUTANEOUS | Status: DC | PRN

## 2023-09-25 MED ORDER — LIDOCAINE HCL (PF) 1 % IJ SOLN: 5.0000 mL | INTRAMUSCULAR | Status: DC | PRN

## 2023-09-25 MED ORDER — LIDOCAINE-PRILOCAINE 2.5-2.5 % EX CREA: 1.0000 | TOPICAL_CREAM | CUTANEOUS | Status: DC | PRN

## 2023-09-25 MED ORDER — AZELASTINE HCL 0.1 % NA SOLN
2.0000 | Freq: Two times a day (BID) | NASAL | Status: AC
  Administered 2023-09-25 (×2): 2 via NASAL
  Filled 2023-09-25: qty 30

## 2023-09-25 MED ORDER — ANTICOAGULANT SODIUM CITRATE 4% (200MG/5ML) IV SOLN: 5.0000 mL | Status: DC | PRN

## 2023-09-25 MED ORDER — HEPARIN SODIUM (PORCINE) 1000 UNIT/ML DIALYSIS: 1000.0000 [IU] | INTRAMUSCULAR | Status: DC | PRN

## 2023-09-25 NOTE — Progress Notes (Signed)
 Received patient in bed to unit.  Alert and oriented.  Informed consent signed and in chart.   TX duration:3  Patient tolerated well.  Transported back to the room  Alert, without acute distress.  Hand-off given to patient's nurse.   Access used: AVF Access issues: n/a  Total UF removed: 3L Medication(s) given: n/a Post HD weight: 57.6kg Post HD VS:     09/25/23 1300  Vitals  Temp 97.9 F (36.6 C)  Temp Source Oral  BP (!) 151/63  MAP (mmHg) 88  Pulse Rate 68  ECG Heart Rate 67  Resp 20  Weight 57.6 kg  Type of Weight Post-Dialysis  Oxygen Therapy  SpO2 100 %  O2 Device Nasal Cannula  O2 Flow Rate (L/min) 3 L/min  During Treatment Monitoring  Blood Flow Rate (mL/min) 399 mL/min  Arterial Pressure (mmHg) -194.13 mmHg  Venous Pressure (mmHg) 249.89 mmHg  TMP (mmHg) 11.11 mmHg  Ultrafiltration Rate (mL/min) 1151 mL/min  Dialysate Flow Rate (mL/min) 300 ml/min  Dialysate Potassium Concentration 2  Dialysate Calcium  Concentration 2.5  Duration of HD Treatment -hour(s) 2.89 hour(s)  Cumulative Fluid Removed (mL) per Treatment  2867.89  HD Safety Checks Performed Yes  Intra-Hemodialysis Comments Tx completed  Post Treatment  Dialyzer Clearance Lightly streaked  Liters Processed 72  Fluid Removed (mL) 3000 mL  Tolerated HD Treatment Yes  AVG/AVF Arterial Site Held (minutes) 5 minutes  AVG/AVF Venous Site Held (minutes) 5 minutes  Fistula / Graft Right Upper arm Arteriovenous fistula  Placement Date/Time: 08/14/22 1230   Orientation: Right  Access Location: (c) Upper arm  Access Type: Arteriovenous fistula  Site Condition No complications  Fistula / Graft Assessment Present;Thrill;Bruit  Status Deaccessed     Ollen LITTIE [facility]

## 2023-09-25 NOTE — Progress Notes (Signed)
 [name]   Subjective: Seen in room. Completed dialysis wit 3L UF. Off oxygen after dialysis. No c/os.   Objective Vitals:   09/25/23 1245 09/25/23 1300 09/25/23 1315 09/25/23 1357  BP: (!) 143/62 (!) 151/63 (!) 149/52 (!) 166/61  Pulse: 65 68 64 64  Resp: 12 20 16 19   Temp:  97.9 F (36.6 C)  98.3 F (36.8 C)  TempSrc:  Oral  Oral  SpO2: 100% 100% 97% 94%  Weight:  57.6 kg      Additional Objective Labs: Basic Metabolic Panel: Recent Labs  Lab 09/23/23 0248 09/24/23 0431 09/25/23 0542  NA 128* 131* 134*  K 4.1 4.3 4.9  CL 91* 95* 93*  CO2 25 29 28   GLUCOSE 105* 93 90  BUN 53* 28* 43*  CREATININE 3.73* 2.57* 3.41*  CALCIUM  8.1* 7.9* 9.0  PHOS 3.9 3.4 3.9   CBC: Recent Labs  Lab 09/20/23 0804 09/21/23 0324 09/22/23 0421 09/24/23 0431 09/24/23 1337 09/25/23 0935  WBC 7.9 5.8 5.2 6.0  --  7.9  NEUTROABS 6.0  --   --   --   --   --   HGB 9.0* 7.3* 7.3* 6.8* 8.2* 8.1*  HCT 28.3* 23.6* 23.8* 21.8* 26.4* 26.5*  MCV 91.9 94.4 95.6 95.6  --  96.4  PLT 369 313 307 319  --  234   Blood Culture    Component Value Date/Time   SDES BLOOD LEFT UPPER ARM 06/24/2023 1641   SDES BLOOD SITE NOT SPECIFIED 06/24/2023 1641   SPECREQUEST  06/24/2023 1641    BOTTLES DRAWN AEROBIC AND ANAEROBIC Blood Culture adequate volume   SPECREQUEST  06/24/2023 1641    BOTTLES DRAWN AEROBIC AND ANAEROBIC Blood Culture results may not be optimal due to an inadequate volume of blood received in culture bottles   CULT  06/24/2023 1641    NO GROWTH 5 DAYS Performed at [facility], [address] [place], [place] [address]    CULT  06/24/2023 1641    NO GROWTH 5 DAYS Performed at [facility], [address], [place], [place] [address]    REPTSTATUS 06/29/2023 FINAL 06/24/2023 1641   REPTSTATUS 06/29/2023 FINAL 06/24/2023 1641     Physical Exam General: Alert, chronically ill appearing, nad Heart: RRR Lungs: Diminished, poor air  movement  Abdomen: non tender  Extremities: Trace LE edema; hip edema  Dialysis Access: AVF   Medications:  iron  sucrose 500 mg (09/23/23 1447)    (feeding supplement) PROSource Plus  30 mL Oral BID BM   atorvastatin   10 mg Oral Daily   azelastine   2 spray Each Nare BID   budesonide -glycopyrrolate -formoterol   2 puff Inhalation BID   calcitRIOL   0.25 mcg Oral Daily   carvedilol   6.25 mg Oral BID WC   Chlorhexidine  Gluconate Cloth  6 each Topical Q0600   darbepoetin (ARANESP ) injection - DIALYSIS  200 mcg Subcutaneous Q Tue-1800   feeding supplement (NEPRO CARB STEADY)  237 mL Oral BID BM   ferrous sulfate   325 mg Oral Q breakfast   gabapentin   100 mg Oral TID   heparin   5,000 Units Subcutaneous Q8H   lidocaine   1 patch Transdermal Q24H   lidocaine  (PF)  8 mL Intradermal Once   multivitamin  1 tablet Oral QHS   nicotine   21 mg Transdermal Daily   pantoprazole   40 mg Oral BID   sertraline   25 mg Oral Daily   sodium bicarbonate   1,300 mg  Oral BID   sodium chloride  flush  3 mL Intravenous Q12H   torsemide   100 mg Oral Daily    No OP Clinic 3- 3.5 hours  2.0 K  BFR 400   AVF Dry wt not clear   Assessment/Plan: Volume overload: Continue to lower volume with HD. Will reinforce fluid restriction of 1200 cc/d.  Pleural Effusion-S/P thoracentesis 6/30. Management per primary Ascites: not sure if has had paracentesis ever ESRD - No OP HD clinic, using TTS schedule here.  Anemia. Hbg 7-8s. S/p multiple transfusions since admission.  S/p ESA and IV Fe here. Continue Aranesp  200 q week. Transfuse prn Hgb < 7.  HTN: BP stable on coreg , torsemide  COPD Social issues. Homeless. H/o substance abuse.  CM/SW involved.   Cynthia Ronnald Acosta PA-C [facility] 09/25/2023,3:32 PM

## 2023-09-25 NOTE — Progress Notes (Signed)
 Progress Note   Patient: [name] FMW:[id number] DOB: 1960/04/15 DOA: 09/20/2023  DOS: the patient was seen and examined on 09/25/2023   Brief hospital course:  63 year old F with PMH of ESRD not on HD due to homelessness, polysubstance use including cocaine and tobacco, chronic GI bleed due to AVMs, PUD, COPD, HTN, GERD and chronic leg and back pain return to ED with shortness of breath after she left AMA a day prior, and admitted with fluid overload and respiratory distress requiring BiPAP. BNP elevated to 1321.    Assessment and Plan:   Acute hypoxic respiratory failure - Secondary to volume overload.  Initially requiring nasal cannula.  Appears to be on room air at this time.  Appears resolved.   Acute on chronic HFpEF secondary to volume overload - Volume removal per nephrology.  -9 L net negative so far.  Showing improvement.   ESRD with noncompliance - No outpatient dialysis chair due to homelessness.  Markedly volume overloaded on presentation, improving.  HD per nephrology.  Disposition difficult.   Acute on chronic normocytic anemia - Anemia of chronic kidney disease.  S/P 1 unit PRBC.  History of small bowel AVMs/PUD.  Protonix  on board.  IV iron  given.   Left pleural effusion - Likely secondary to HFpEF/ESRD/volume overload.  Status post thoracentesis by IR.   Chronic hyponatremia - Secondary to ESRD.  Nephrology on board.   Hypertension - Normotensive.  Coreg  on board.   Polysubstance abuse - Cocaine positive on presentation.  Nicotine  patch on board.  Encouraging cessation.   Homelessness - Complicating care.  TOC involved.   Goals of care - Disposition difficult given hemodialysis and homelessness.  Eventually will likely be discharged back to community, [facility] shelter/[facility].  Pt states she will be able to acquire a phone after discharge.   Subjective: Patient resting comfortably in HD this morning.  Denies any worsening shortness of breath,  fever, chills, chest pain, nausea, vomiting, abdominal pain.  Doing well with volume removal so far.  Patient states that she is able to be discharged to use motivated to require a foam which will allow her to stay in touch with outpatient dialysis centers and living arrangements such as ministry shelter.  Physical Exam:  Vitals:   09/25/23 1230 09/25/23 1245 09/25/23 1300 09/25/23 1315  BP: 138/62 (!) 143/62 (!) 151/63 (!) 149/52  Pulse: 64 65 68 64  Resp: (!) 22 12 20 16   Temp:   97.9 F (36.6 C)   TempSrc:   Oral   SpO2: 100% 100% 100% 97%  Weight:   57.6 kg     GENERAL:  Alert, pleasant, no acute distress, disheveled HEENT:  EOMI CARDIOVASCULAR:  RRR, no murmurs appreciated RESPIRATORY:  Clear to auscultation, no wheezing, rales, or rhonchi GASTROINTESTINAL:  Soft, nontender, nondistended EXTREMITIES: Mild BL LE pitting edema NEURO:  No new focal deficits appreciated SKIN:  No rashes noted PSYCH:  Appropriate mood and affect    Data Reviewed:  Imaging Studies: US  THORACENTESIS ASP PLEURAL SPACE W/IMG GUIDE Result Date: 09/21/2023 INDICATION: 63 year old female with history of ESRD and recurrent pleural effusion. Previous imaging show bilateral pleural effusion. Request for therapeutic thoracentesis. EXAM: ULTRASOUND GUIDED LEFT THORACENTESIS MEDICATIONS: 7 mL 1% lidocaine  COMPLICATIONS: None immediate. PROCEDURE: An ultrasound guided thoracentesis was thoroughly discussed with the patient and questions answered. The benefits, risks, alternatives and complications were also discussed. The patient understands and wishes to proceed with the procedure. Written consent was obtained. The right pleural space was visualized  with ultrasound which show densely loculated pleural effusion without large pocket of fluid. Ultrasound was performed to localize and mark an adequate pocket of fluid in the left chest. The area was then prepped and draped in the normal sterile fashion. 1% Lidocaine  was  used for local anesthesia. Under ultrasound guidance a 6 Fr Safe-T-Centesis catheter was introduced. Thoracentesis was performed. The catheter was removed and a dressing applied. FINDINGS: A total of approximately 550 mL of hazy yellow fluid was removed. Post procedure chest X-ray reviewed, negative for pneumothorax. IMPRESSION: Successful ultrasound guided left thoracentesis yielding 550 mL of pleural fluid. Performed by: Toya Cousin, PA-C Electronically Signed   By: Cordella Banner   On: 09/21/2023 16:07   DG Chest 1 View Result Date: 09/21/2023 CLINICAL DATA:  63 year old female status post left side ultrasound-guided thoracentesis this morning. Dialysis patient. EXAM: CHEST  1 VIEW COMPARISON:  Thoracentesis images 10 47 hours today. Portable chest yesterday. FINDINGS: Portable AP semi upright view at 1136 hours. Stable lung volumes. Stable cardiac size and mediastinal contours. Significantly regressed left lung base pleural effusion from the chest x-ray yesterday. No pneumothorax identified. Stable right lung base and peripheral right lung opacity compatible with at least partially loculated effusion, associated airspace disease. No areas of worsening ventilation. IMPRESSION: 1. Regressed left pleural effusion post thoracentesis with no pneumothorax. 2. Stable right lung base opacity compatible with more loculated effusion and associated airspace disease. Electronically Signed   By: VEAR Hurst M.D.   On: 09/21/2023 11:57   DG Chest Port 1 View Result Date: 09/20/2023 CLINICAL DATA:  63 year old female with shortness of breath, dialysis patient, swelling. Status post ultrasound-guided right side thoracentesis 09/15/2023. EXAM: PORTABLE CHEST 1 VIEW COMPARISON:  Portable chest 09/15/2023 and earlier. FINDINGS: Portable AP semi upright view at 0827 hours. Ongoing veiling and confluent opacity at the right lung base, where the peripheral and lateral costophrenic angle veiling opacity appears increased. Stable  cardiomegaly and mediastinal contours. Stable associated right hemithorax volume loss. Interval increased veiling and confluent opacity at the left lung base. No pneumothorax. Stable pulmonary vascularity elsewhere. Visualized tracheal air column is within normal limits. No acute osseous abnormality identified. Paucity of bowel gas. IMPRESSION: 1. Increased small left and similarly re-accumulated right pleural effusions since the post thoracentesis x-ray 09/15/2023. 2. Otherwise stable with cardiomegaly, right lung atelectasis. No overt edema. Electronically Signed   By: VEAR Hurst M.D.   On: 09/20/2023 08:41   IR THORACENTESIS ASP PLEURAL SPACE W/IMG GUIDE Result Date: 09/15/2023 INDICATION: 63 year old female with ESRD presents with shortness of breast. Previous imaging showed bilateral pleural effusion, right greater than left. Request for therapeutic thoracentesis. EXAM: ULTRASOUND GUIDED RIGHT THORACENTESIS MEDICATIONS: 5 mL 1% lidocaine  COMPLICATIONS: None immediate. PROCEDURE: An ultrasound guided thoracentesis was thoroughly discussed with the patient and questions answered. The benefits, risks, alternatives and complications were also discussed. The patient understands and wishes to proceed with the procedure. Written consent was obtained. Ultrasound was performed to localize and mark an adequate pocket of fluid in the right chest. The area was then prepped and draped in the normal sterile fashion. 1% Lidocaine  was used for local anesthesia. Under ultrasound guidance a 6 Fr Safe-T-Centesis catheter was introduced. Thoracentesis was performed. The catheter was removed and a dressing applied. FINDINGS: A total of approximately 700 mL of hazy amber fluid was removed. Post procedure chest X-ray reviewed, negative for pneumothorax. IMPRESSION: Successful ultrasound guided right thoracentesis yielding 700 mL of pleural fluid. Performed by: Aimee Han, PA-C Electronically Signed  By: Marcey Moan M.D.   On:  09/15/2023 10:42   DG Chest 1 View Result Date: 09/15/2023 CLINICAL DATA:  Right thoracentesis. EXAM: CHEST  1 VIEW COMPARISON:  09/13/2023 and CT chest 11/15/2021. FINDINGS: Trachea is midline. Heart is enlarged. Nodule aided small right pleural effusion, decreased in size from 09/13/2023, status post thoracentesis. No pneumothorax. Right perihilar consolidation with airspace opacification in the right middle and right lower lobes. Streaky atelectasis in the left lower lobe. IMPRESSION: 1. Small residual loculated right pleural effusion. No pneumothorax status post thoracentesis. 2. Right perihilar consolidation with airspace opacification in the right middle and right lower lobes, findings which may be due to pneumonia. Difficult to exclude malignancy. Recommend follow-up to clearing or CT chest with contrast, as clinically indicated. Electronically Signed   By: Newell Eke M.D.   On: 09/15/2023 08:53   DG Chest 2 View Result Date: 09/13/2023 CLINICAL DATA:  Shortness of breath.  Chest pain. EXAM: CHEST - 2 VIEW COMPARISON:  09/06/2023 FINDINGS: Right pleural effusion has increased from prior exam, at least moderate in size. Associated right basilar opacity. There is volume loss in the right hemithorax. Small left pleural effusion and basilar opacity, increasing. Mild cardiomegaly. Diffuse peribronchial thickening. No pneumothorax. IMPRESSION: 1. Increasing bilateral pleural effusions, right greater than left. Associated basilar opacities. 2. Mild cardiomegaly. Diffuse peribronchial thickening. Electronically Signed   By: Andrea Gasman M.D.   On: 09/13/2023 18:12   DG Chest Portable 1 View Result Date: 09/06/2023 CLINICAL DATA:  Shortness of breath with chest EXAM: PORTABLE CHEST 1 VIEW COMPARISON:  Chest x-ray 07/23/2023 FINDINGS: The heart is enlarged, unchanged. There is a moderate right pleural effusion which has increased. Central interstitial prominence is seen bilaterally, increased from  prior. No pneumothorax. No acute fracture. IMPRESSION: 1. Increasing moderate right pleural effusion. 2. Increased central interstitial prominence, likely edema. Electronically Signed   By: Greig Pique M.D.   On: 09/06/2023 19:36    No new imaging results  Previous records (including but not limited to H&P, progress notes, nursing notes, TOC management) were reviewed in assessment of this patient.  Labs: CBC: Recent Labs  Lab 09/20/23 0804 09/21/23 0324 09/22/23 0421 09/24/23 0431 09/24/23 1337 09/25/23 0935  WBC 7.9 5.8 5.2 6.0  --  7.9  NEUTROABS 6.0  --   --   --   --   --   HGB 9.0* 7.3* 7.3* 6.8* 8.2* 8.1*  HCT 28.3* 23.6* 23.8* 21.8* 26.4* 26.5*  MCV 91.9 94.4 95.6 95.6  --  96.4  PLT 369 313 307 319  --  234   Basic Metabolic Panel: Recent Labs  Lab 09/19/23 0559 09/20/23 0804 09/21/23 0324 09/22/23 0421 09/23/23 0248 09/24/23 0431 09/25/23 0542  NA 131*   < > 135 131* 128* 131* 134*  K 3.8   < > 4.0 4.5 4.1 4.3 4.9  CL 92*   < > 96* 91* 91* 95* 93*  CO2 28   < > 30 27 25 29 28   GLUCOSE 92   < > 100* 87 105* 93 90  BUN 24*   < > 22 41* 53* 28* 43*  CREATININE 2.25*   < > 2.11* 3.22* 3.73* 2.57* 3.41*  CALCIUM  8.3*   < > 8.3* 8.5* 8.1* 7.9* 9.0  MG 2.0  --   --  2.0  --   --   --   PHOS 3.3  --  3.3 3.6 3.9 3.4 3.9   < > = values  in this interval not displayed.   Liver Function Tests: Recent Labs  Lab 09/21/23 0324 09/22/23 0421 09/23/23 0248 09/24/23 0431 09/25/23 0542  ALBUMIN  2.1* 2.3* 2.2* 2.1* 2.3*   CBG: No results for input(s): GLUCAP in the last 168 hours.  Scheduled Meds:  (feeding supplement) PROSource Plus  30 mL Oral BID BM   atorvastatin   10 mg Oral Daily   azelastine   2 spray Each Nare BID   budesonide -glycopyrrolate -formoterol   2 puff Inhalation BID   calcitRIOL   0.25 mcg Oral Daily   carvedilol   6.25 mg Oral BID WC   Chlorhexidine  Gluconate Cloth  6 each Topical Q0600   darbepoetin (ARANESP ) injection - DIALYSIS  200 mcg  Subcutaneous Q Tue-1800   feeding supplement (NEPRO CARB STEADY)  237 mL Oral BID BM   ferrous sulfate   325 mg Oral Q breakfast   gabapentin   100 mg Oral TID   heparin   5,000 Units Subcutaneous Q8H   lidocaine   1 patch Transdermal Q24H   lidocaine  (PF)  8 mL Intradermal Once   multivitamin  1 tablet Oral QHS   nicotine   21 mg Transdermal Daily   pantoprazole   40 mg Oral BID   sertraline   25 mg Oral Daily   sodium bicarbonate   1,300 mg Oral BID   sodium chloride  flush  3 mL Intravenous Q12H   torsemide   100 mg Oral Daily   Continuous Infusions:  anticoagulant sodium citrate      iron  sucrose 500 mg (09/23/23 1447)   PRN Meds:.acetaminophen  **OR** acetaminophen , alteplase , anticoagulant sodium citrate , heparin , ipratropium-albuterol , lidocaine  (PF), lidocaine -prilocaine , naLOXone  (NARCAN )  injection, oxyCODONE , pentafluoroprop-tetrafluoroeth, traZODone   Family Communication: None at bedside  Disposition: Status is: Inpatient Remains inpatient appropriate because: ESRD volume overload     Time spent: 38 minutes  Length of inpatient stay: 5 days  Author: Carliss LELON Canales, DO 09/25/2023 1:27 PM  For on call review [email or web address].

## 2023-09-25 NOTE — Plan of Care (Signed)

## 2023-09-26 ENCOUNTER — Inpatient Hospital Stay (HOSPITAL_COMMUNITY)

## 2023-09-26 MED ORDER — GUAIFENESIN ER 600 MG PO TB12
600.0000 mg | ORAL_TABLET | Freq: Two times a day (BID) | ORAL | Status: DC | PRN
  Administered 2023-09-26: 600 mg via ORAL
  Filled 2023-09-26: qty 1

## 2023-09-26 NOTE — TOC Progression Note (Addendum)
 Transition of Care [facility]) - Progression Note    Patient Details  Name: [name] MRN: [id number] Date of Birth: Sep 22, 1960  [facility] of [facility]) CM/SW Contact  Tom-[name], Ravindra Baranek Daphne, RN Phone Number: 09/26/2023, 1:34 PM  Clinical Narrative:     Patient received 1U PRBC on 09/24/23 for hgb of 6.8, today hgb at 8.1. Volume Overload improving, s/p Paracentesis by IR. Per MD, plan to repeat CXR today 09/26/23 to reevaluate Volume status and Residual Effusion. Patient was on 3L O2 yesterday, on Room Air today. Nephrology following for iHD.   Patient not Medically ready for discharge.  CM will continue to follow for discharge needs as patient progresses with care towards discharge.              Expected Discharge Plan: Homeless Shelter Barriers to Discharge: Continued Medical Work up  Expected Discharge Plan and Services   Discharge Planning Services: CM Consult   Living arrangements for the past 2 months: Homeless                                       Social Determinants of Health (SDOH) Interventions SDOH Screenings   Food Insecurity: Food Insecurity Present (09/23/2023)  Housing: High Risk (09/23/2023)  Transportation Needs: Unmet Transportation Needs (09/23/2023)  Utilities: Patient Unable To Answer (09/23/2023)  Recent Concern: Utilities - At Risk (07/25/2023)  Alcohol  Screen: Low Risk  (02/10/2023)  Depression (PHQ2-9): Medium Risk (06/11/2023)  Financial Resource Strain: High Risk (02/10/2023)  Physical Activity: Inactive (02/10/2023)  Social Connections: Socially Isolated (06/12/2023)  Tobacco Use: High Risk (09/20/2023)  Health Literacy: Adequate Health Literacy (02/10/2023)    Readmission Risk Interventions    07/01/2023   12:39 PM 06/16/2023    4:04 PM 01/28/2023   10:32 AM  Readmission Risk Prevention Plan  Transportation Screening Complete Complete Complete  Medication Review (RN Care Manager) Referral to Pharmacy Complete Complete  PCP  or Specialist appointment within 3-5 days of discharge Complete Complete   HRI or Home Care Consult Complete Complete   SW Recovery Care/Counseling Consult Complete Complete   Palliative Care Screening Not Applicable Not Applicable   Skilled Nursing Facility Not Applicable Not Applicable

## 2023-09-26 NOTE — Progress Notes (Signed)
 Progress Note   Patient: [name] FMW:[id number] DOB: 08-11-1960 DOA: 09/20/2023  DOS: the patient was seen and examined on 09/26/2023   Brief hospital course:  63 year old F with PMH of ESRD not on HD due to homelessness, polysubstance use including cocaine and tobacco, chronic GI bleed due to AVMs, PUD, COPD, HTN, GERD and chronic leg and back pain return to ED with shortness of breath after she left AMA a day prior, and admitted with fluid overload and respiratory distress requiring BiPAP. BNP elevated to 1321.    Assessment and Plan:   Acute hypoxic respiratory failure - Secondary to volume overload.  Initially requiring nasal cannula.  Appears to be on room air at this time.  Appears resolved.   Acute on chronic HFpEF secondary to volume overload - Volume removal per nephrology.  -9 L net negative so far.  Showing improvement.   ESRD with noncompliance - No outpatient dialysis chair due to homelessness.  Markedly volume overloaded on presentation, improving.  HD per nephrology.  Disposition difficult.  Pt has poor understanding of her disease and compliance is difficult.     Acute on chronic normocytic anemia - Anemia of chronic kidney disease.  S/P 1 unit PRBC.  History of small bowel AVMs/PUD.  Protonix  on board.  IV iron  given.   BL pleural effusion (POA) - Likely secondary to HFpEF/ESRD/volume overload.  Status post thoracentesis by IR 6/30 and 7/6 (- ).  Will repeat CXR today to reevaluate volume status and residual effusion.    Chronic hyponatremia - Secondary to ESRD.  Nephrology on board.   Hypertension - Normotensive.  Coreg  on board.   Polysubstance abuse - Cocaine positive on presentation.  Nicotine  patch on board.  Encouraging cessation.   Homelessness - Complicating care.  TOC involved.   Goals of care - Disposition difficult given hemodialysis and homelessness.  Eventually will likely be discharged back to community, ministry shelter/[place] house.  Pt  states she will be able to acquire a phone after discharge.  Anticipate 1-2 days.      Subjective: Patient resting comfortably this morning.  Discussion about her volume status and discharge disposition planning.  Patient continues to have poor understanding of her kidney disease and volume overload.  Cleaned that she is not volume overloaded on presentation.  However reiterated that she is no -9 L and received thoracentesis to remove fluid on her lungs.  Continue to attempt to reiterate goals of therapy and discharge.  Physical Exam:  Vitals:   09/25/23 1357 09/25/23 2102 09/26/23 0643 09/26/23 0828  BP: (!) 166/61 (!) 164/65 (!) 144/83 (!) 155/57  Pulse: 64 73 73 70  Resp: 19 19 17 18   Temp: 98.3 F (36.8 C) (!) 97.5 F (36.4 C) 98.1 F (36.7 C) 97.6 F (36.4 C)  TempSrc: Oral Oral  Oral  SpO2: 94% 100% 95% 93%  Weight:        GENERAL:  Alert, pleasant, no acute distress, disheveled HEENT:  EOMI CARDIOVASCULAR:  RRR, no murmurs appreciated RESPIRATORY:  Clear to auscultation, no wheezing, rales, or rhonchi GASTROINTESTINAL:  Soft, nontender, nondistended EXTREMITIES: Trace BL LE pitting edema NEURO:  No new focal deficits appreciated SKIN:  No rashes noted PSYCH:  Appropriate mood and affect    Data Reviewed:  Imaging Studies: US  THORACENTESIS ASP PLEURAL SPACE W/IMG GUIDE Result Date: 09/21/2023 INDICATION: 63 year old female with history of ESRD and recurrent pleural effusion. Previous imaging show bilateral pleural effusion. Request for therapeutic thoracentesis. EXAM: ULTRASOUND GUIDED LEFT THORACENTESIS MEDICATIONS:  7 mL 1% lidocaine  COMPLICATIONS: None immediate. PROCEDURE: An ultrasound guided thoracentesis was thoroughly discussed with the patient and questions answered. The benefits, risks, alternatives and complications were also discussed. The patient understands and wishes to proceed with the procedure. Written consent was obtained. The right pleural space was  visualized with ultrasound which show densely loculated pleural effusion without large pocket of fluid. Ultrasound was performed to localize and mark an adequate pocket of fluid in the left chest. The area was then prepped and draped in the normal sterile fashion. 1% Lidocaine  was used for local anesthesia. Under ultrasound guidance a 6 Fr Safe-T-Centesis catheter was introduced. Thoracentesis was performed. The catheter was removed and a dressing applied. FINDINGS: A total of approximately 550 mL of hazy yellow fluid was removed. Post procedure chest X-ray reviewed, negative for pneumothorax. IMPRESSION: Successful ultrasound guided left thoracentesis yielding 550 mL of pleural fluid. Performed by: Toya Cousin, PA-C Electronically Signed   By: Cordella Banner   On: 09/21/2023 16:07   DG Chest 1 View Result Date: 09/21/2023 CLINICAL DATA:  63 year old female status post left side ultrasound-guided thoracentesis this morning. Dialysis patient. EXAM: CHEST  1 VIEW COMPARISON:  Thoracentesis images 10 47 hours today. Portable chest yesterday. FINDINGS: Portable AP semi upright view at 1136 hours. Stable lung volumes. Stable cardiac size and mediastinal contours. Significantly regressed left lung base pleural effusion from the chest x-ray yesterday. No pneumothorax identified. Stable right lung base and peripheral right lung opacity compatible with at least partially loculated effusion, associated airspace disease. No areas of worsening ventilation. IMPRESSION: 1. Regressed left pleural effusion post thoracentesis with no pneumothorax. 2. Stable right lung base opacity compatible with more loculated effusion and associated airspace disease. Electronically Signed   By: VEAR Hurst M.D.   On: 09/21/2023 11:57   DG Chest Port 1 View Result Date: 09/20/2023 CLINICAL DATA:  63 year old female with shortness of breath, dialysis patient, swelling. Status post ultrasound-guided right side thoracentesis 09/15/2023. EXAM: PORTABLE  CHEST 1 VIEW COMPARISON:  Portable chest 09/15/2023 and earlier. FINDINGS: Portable AP semi upright view at 0827 hours. Ongoing veiling and confluent opacity at the right lung base, where the peripheral and lateral costophrenic angle veiling opacity appears increased. Stable cardiomegaly and mediastinal contours. Stable associated right hemithorax volume loss. Interval increased veiling and confluent opacity at the left lung base. No pneumothorax. Stable pulmonary vascularity elsewhere. Visualized tracheal air column is within normal limits. No acute osseous abnormality identified. Paucity of bowel gas. IMPRESSION: 1. Increased small left and similarly re-accumulated right pleural effusions since the post thoracentesis x-ray 09/15/2023. 2. Otherwise stable with cardiomegaly, right lung atelectasis. No overt edema. Electronically Signed   By: VEAR Hurst M.D.   On: 09/20/2023 08:41   IR THORACENTESIS ASP PLEURAL SPACE W/IMG GUIDE Result Date: 09/15/2023 INDICATION: 63 year old female with ESRD presents with shortness of breast. Previous imaging showed bilateral pleural effusion, right greater than left. Request for therapeutic thoracentesis. EXAM: ULTRASOUND GUIDED RIGHT THORACENTESIS MEDICATIONS: 5 mL 1% lidocaine  COMPLICATIONS: None immediate. PROCEDURE: An ultrasound guided thoracentesis was thoroughly discussed with the patient and questions answered. The benefits, risks, alternatives and complications were also discussed. The patient understands and wishes to proceed with the procedure. Written consent was obtained. Ultrasound was performed to localize and mark an adequate pocket of fluid in the right chest. The area was then prepped and draped in the normal sterile fashion. 1% Lidocaine  was used for local anesthesia. Under ultrasound guidance a 6 Fr Safe-T-Centesis catheter was introduced. Thoracentesis was  performed. The catheter was removed and a dressing applied. FINDINGS: A total of approximately 700 mL of  hazy amber fluid was removed. Post procedure chest X-ray reviewed, negative for pneumothorax. IMPRESSION: Successful ultrasound guided right thoracentesis yielding 700 mL of pleural fluid. Performed by: Aimee Han, PA-C Electronically Signed   By: Marcey Moan M.D.   On: 09/15/2023 10:42   DG Chest 1 View Result Date: 09/15/2023 CLINICAL DATA:  Right thoracentesis. EXAM: CHEST  1 VIEW COMPARISON:  09/13/2023 and CT chest 11/15/2021. FINDINGS: Trachea is midline. Heart is enlarged. Nodule aided small right pleural effusion, decreased in size from 09/13/2023, status post thoracentesis. No pneumothorax. Right perihilar consolidation with airspace opacification in the right middle and right lower lobes. Streaky atelectasis in the left lower lobe. IMPRESSION: 1. Small residual loculated right pleural effusion. No pneumothorax status post thoracentesis. 2. Right perihilar consolidation with airspace opacification in the right middle and right lower lobes, findings which may be due to pneumonia. Difficult to exclude malignancy. Recommend follow-up to clearing or CT chest with contrast, as clinically indicated. Electronically Signed   By: Newell Eke M.D.   On: 09/15/2023 08:53   DG Chest 2 View Result Date: 09/13/2023 CLINICAL DATA:  Shortness of breath.  Chest pain. EXAM: CHEST - 2 VIEW COMPARISON:  09/06/2023 FINDINGS: Right pleural effusion has increased from prior exam, at least moderate in size. Associated right basilar opacity. There is volume loss in the right hemithorax. Small left pleural effusion and basilar opacity, increasing. Mild cardiomegaly. Diffuse peribronchial thickening. No pneumothorax. IMPRESSION: 1. Increasing bilateral pleural effusions, right greater than left. Associated basilar opacities. 2. Mild cardiomegaly. Diffuse peribronchial thickening. Electronically Signed   By: Andrea Gasman M.D.   On: 09/13/2023 18:12   DG Chest Portable 1 View Result Date: 09/06/2023 CLINICAL DATA:   Shortness of breath with chest EXAM: PORTABLE CHEST 1 VIEW COMPARISON:  Chest x-ray 07/23/2023 FINDINGS: The heart is enlarged, unchanged. There is a moderate right pleural effusion which has increased. Central interstitial prominence is seen bilaterally, increased from prior. No pneumothorax. No acute fracture. IMPRESSION: 1. Increasing moderate right pleural effusion. 2. Increased central interstitial prominence, likely edema. Electronically Signed   By: Greig Pique M.D.   On: 09/06/2023 19:36    There are no new results to review at this time.  Previous records (including but not limited to H&P, progress notes, nursing notes, TOC management) were reviewed in assessment of this patient.  Labs: CBC: Recent Labs  Lab 09/20/23 0804 09/21/23 0324 09/22/23 0421 09/24/23 0431 09/24/23 1337 09/25/23 0935  WBC 7.9 5.8 5.2 6.0  --  7.9  NEUTROABS 6.0  --   --   --   --   --   HGB 9.0* 7.3* 7.3* 6.8* 8.2* 8.1*  HCT 28.3* 23.6* 23.8* 21.8* 26.4* 26.5*  MCV 91.9 94.4 95.6 95.6  --  96.4  PLT 369 313 307 319  --  234   Basic Metabolic Panel: Recent Labs  Lab 09/21/23 0324 09/22/23 0421 09/23/23 0248 09/24/23 0431 09/25/23 0542  NA 135 131* 128* 131* 134*  K 4.0 4.5 4.1 4.3 4.9  CL 96* 91* 91* 95* 93*  CO2 30 27 25 29 28   GLUCOSE 100* 87 105* 93 90  BUN 22 41* 53* 28* 43*  CREATININE 2.11* 3.22* 3.73* 2.57* 3.41*  CALCIUM  8.3* 8.5* 8.1* 7.9* 9.0  MG  --  2.0  --   --   --   PHOS 3.3 3.6 3.9 3.4 3.9  Liver Function Tests: Recent Labs  Lab 09/21/23 0324 09/22/23 0421 09/23/23 0248 09/24/23 0431 09/25/23 0542  ALBUMIN  2.1* 2.3* 2.2* 2.1* 2.3*   CBG: No results for input(s): GLUCAP in the last 168 hours.  Scheduled Meds:  (feeding supplement) PROSource Plus  30 mL Oral BID BM   atorvastatin   10 mg Oral Daily   budesonide -glycopyrrolate -formoterol   2 puff Inhalation BID   calcitRIOL   0.25 mcg Oral Daily   carvedilol   6.25 mg Oral BID WC   Chlorhexidine  Gluconate  Cloth  6 each Topical Q0600   darbepoetin (ARANESP ) injection - DIALYSIS  200 mcg Subcutaneous Q Tue-1800   feeding supplement (NEPRO CARB STEADY)  237 mL Oral BID BM   ferrous sulfate   325 mg Oral Q breakfast   gabapentin   100 mg Oral TID   heparin   5,000 Units Subcutaneous Q8H   lidocaine   1 patch Transdermal Q24H   lidocaine  (PF)  8 mL Intradermal Once   multivitamin  1 tablet Oral QHS   nicotine   21 mg Transdermal Daily   pantoprazole   40 mg Oral BID   sertraline   25 mg Oral Daily   sodium bicarbonate   1,300 mg Oral BID   sodium chloride  flush  3 mL Intravenous Q12H   torsemide   100 mg Oral Daily   Continuous Infusions:  iron  sucrose 500 mg (09/23/23 1447)   PRN Meds:.acetaminophen  **OR** acetaminophen , ipratropium-albuterol , naLOXone  (NARCAN )  injection, oxyCODONE , traZODone   Family Communication: None at bedside  Disposition: Status is: Inpatient Remains inpatient appropriate because: ESRD     Time spent: 35 minutes  Length of inpatient stay: 6 days  Author: Carliss LELON Canales, DO 09/26/2023 1:14 PM  For on call review [email or web address].

## 2023-09-26 NOTE — Progress Notes (Signed)
 [facility] Progress Note   Subjective: Seen in room. Still has lingering cough/congestion but overall breathing much improved. On room air. She denies chest pain, sob.   Objective Vitals:   09/25/23 1357 09/25/23 2102 09/26/23 0643 09/26/23 0828  BP: (!) 166/61 (!) 164/65 (!) 144/83 (!) 155/57  Pulse: 64 73 73 70  Resp: 19 19 17 18   Temp: 98.3 F (36.8 C) (!) 97.5 F (36.4 C) 98.1 F (36.7 C) 97.6 F (36.4 C)  TempSrc: Oral Oral  Oral  SpO2: 94% 100% 95% 93%  Weight:        Additional Objective Labs: Basic Metabolic Panel: Recent Labs  Lab 09/23/23 0248 09/24/23 0431 09/25/23 0542  NA 128* 131* 134*  K 4.1 4.3 4.9  CL 91* 95* 93*  CO2 25 29 28   GLUCOSE 105* 93 90  BUN 53* 28* 43*  CREATININE 3.73* 2.57* 3.41*  CALCIUM  8.1* 7.9* 9.0  PHOS 3.9 3.4 3.9   CBC: Recent Labs  Lab 09/20/23 0804 09/21/23 0324 09/22/23 0421 09/24/23 0431 09/24/23 1337 09/25/23 0935  WBC 7.9 5.8 5.2 6.0  --  7.9  NEUTROABS 6.0  --   --   --   --   --   HGB 9.0* 7.3* 7.3* 6.8* 8.2* 8.1*  HCT 28.3* 23.6* 23.8* 21.8* 26.4* 26.5*  MCV 91.9 94.4 95.6 95.6  --  96.4  PLT 369 313 307 319  --  234   Blood Culture    Component Value Date/Time   SDES BLOOD LEFT UPPER ARM 06/24/2023 1641   SDES BLOOD SITE NOT SPECIFIED 06/24/2023 1641   SPECREQUEST  06/24/2023 1641    BOTTLES DRAWN AEROBIC AND ANAEROBIC Blood Culture adequate volume   SPECREQUEST  06/24/2023 1641    BOTTLES DRAWN AEROBIC AND ANAEROBIC Blood Culture results may not be optimal due to an inadequate volume of blood received in culture bottles   CULT  06/24/2023 1641    NO GROWTH 5 DAYS Performed at [facility], [address], [place], [place] [address]    CULT  06/24/2023 1641    NO GROWTH 5 DAYS Performed at [facility], [address] [place], [place] [address]    REPTSTATUS 06/29/2023 FINAL 06/24/2023 1641   REPTSTATUS 06/29/2023 FINAL 06/24/2023 1641     Physical Exam General:  Alert, chronically ill appearing, nad Heart: RRR Lungs: Diminished, poor air movement  Abdomen: non tender  Extremities: Trace LE edema; hip edema  Dialysis Access: AVF   Medications:  iron  sucrose 500 mg (09/23/23 1447)    (feeding supplement) PROSource Plus  30 mL Oral BID BM   atorvastatin   10 mg Oral Daily   budesonide -glycopyrrolate -formoterol   2 puff Inhalation BID   calcitRIOL   0.25 mcg Oral Daily   carvedilol   6.25 mg Oral BID WC   Chlorhexidine  Gluconate Cloth  6 each Topical Q0600   darbepoetin (ARANESP ) injection - DIALYSIS  200 mcg Subcutaneous Q Tue-1800   feeding supplement (NEPRO CARB STEADY)  237 mL Oral BID BM   ferrous sulfate   325 mg Oral Q breakfast   gabapentin   100 mg Oral TID   heparin   5,000 Units Subcutaneous Q8H   lidocaine   1 patch Transdermal Q24H   lidocaine  (PF)  8 mL Intradermal Once   multivitamin  1 tablet Oral QHS   nicotine   21 mg Transdermal Daily   pantoprazole   40 mg Oral BID   sertraline   25 mg Oral Daily   sodium bicarbonate   1,300 mg Oral BID   sodium chloride  flush  3 mL Intravenous Q12H   torsemide   100 mg Oral Daily    No OP Clinic 3- 3.5 hours  2.0 K  BFR 400   AVF Dry wt not clear   Assessment/Plan: Volume overload: Improved with dialysis. Continue to lower volume with HD. Will reinforce fluid restriction of 1200 cc/d.  Pleural Effusion-S/P thoracentesis 6/30. Management per primary Ascites: not sure if has had paracentesis ever ESRD - No OP HD clinic, using TTS schedule here. HD Saturday.  Anemia. Hbg 7-8s. S/p multiple transfusions since admission.  S/p ESA and IV Fe here. Continue Aranesp  200 q week. Transfuse prn Hgb < 7.  HTN: BP stable on coreg , torsemide  COPD Social issues. Homeless. H/o substance abuse.  CM/SW involved.   Maisie Ronnald Acosta PA-C [facility] 09/26/2023,10:36 AM

## 2023-09-27 LAB — COMPREHENSIVE METABOLIC PANEL WITH GFR
ALT: 18 U/L (ref 0–44)
AST: 23 U/L (ref 15–41)
Albumin: 2.3 g/dL — ABNORMAL LOW (ref 3.5–5.0)
Alkaline Phosphatase: 97 U/L (ref 38–126)
Anion gap: 12 (ref 5–15)
BUN: 49 mg/dL — ABNORMAL HIGH (ref 8–23)
CO2: 26 mmol/L (ref 22–32)
Calcium: 8.8 mg/dL — ABNORMAL LOW (ref 8.9–10.3)
Chloride: 97 mmol/L — ABNORMAL LOW (ref 98–111)
Creatinine, Ser: 3.58 mg/dL — ABNORMAL HIGH (ref 0.44–1.00)
GFR, Estimated: 14 mL/min — ABNORMAL LOW (ref >60)
Glucose, Bld: 87 mg/dL (ref 70–99)
Potassium: 4.7 mmol/L (ref 3.5–5.1)
Sodium: 135 mmol/L (ref 135–145)
Total Bilirubin: 0.6 mg/dL (ref 0.0–1.2)
Total Protein: 6.1 g/dL — ABNORMAL LOW (ref 6.5–8.1)

## 2023-09-27 LAB — CBC
HCT: 26.5 % — ABNORMAL LOW (ref 36.0–46.0)
Hemoglobin: 8.3 g/dL — ABNORMAL LOW (ref 12.0–15.0)
MCH: 30.3 pg (ref 26.0–34.0)
MCHC: 31.3 g/dL (ref 30.0–36.0)
MCV: 96.7 fL (ref 80.0–100.0)
Platelets: 343 K/uL (ref 150–400)
RBC: 2.74 MIL/uL — ABNORMAL LOW (ref 3.87–5.11)
RDW: 18.9 % — ABNORMAL HIGH (ref 11.5–15.5)
WBC: 7.5 K/uL (ref 4.0–10.5)
nRBC: 0 % (ref 0.0–0.2)

## 2023-09-27 LAB — MAGNESIUM: Magnesium: 2.1 mg/dL (ref 1.7–2.4)

## 2023-09-27 LAB — PHOSPHORUS: Phosphorus: 3.8 mg/dL (ref 2.5–4.6)

## 2023-09-27 NOTE — Progress Notes (Signed)
 Pt. Requesting to leave AMA, pt. Educated about potential risks of signing out AMA including possible death, pt. Verbalized understanding and still insists on signing out AMA, MD aware, pt. Transferred to lobby via w/c accompanied by staff in stable condition  [name]

## 2023-09-27 NOTE — Progress Notes (Signed)
 Progress Note   Patient: [name] FMW:[id number] DOB: 1960-07-20 DOA: 09/20/2023     7 DOS: the patient was seen and examined on 09/27/2023   Brief hospital course: 63 year old F with PMH of ESRD not on HD due to homelessness, polysubstance use including cocaine and tobacco, chronic GI bleed due to AVMs, PUD, COPD, HTN, GERD and chronic leg and back pain return to ED with shortness of breath after she left AMA a day prior, and admitted with fluid overload and respiratory distress requiring BiPAP. BNP elevated to 1321.  Patient is still requiring 3 L of oxygen supplementation to maintain saturation.  Short winded with activity.  Difficult situation regarding ongoing homelessness.   Assessment and Plan:   Acute hypoxic respiratory failure - Secondary to volume overload.  - Repeat chest x-ray demonstrating moderate pleural effusion and vascular congestion - Planning for hemodialysis later today - Requiring 3 L nasal cannula supplementation to maintain saturation.   Acute on chronic HFpEF secondary to volume overload - Volume removal per nephrology.   -Roughly 9 L net negative so far.   - Overall showing improvement. -Still requiring oxygen supplementation and reporting feeling short winded with activity Hemodialysis to be done later today.   ESRD with noncompliance - No outpatient dialysis chair due to homelessness.  Markedly volume overloaded on presentation, improving.  HD per nephrology.  Disposition difficult.  Pt has poor understanding of her disease and compliance is difficult.   -Appreciate assistance and recommendation by nephrology service.   Acute on chronic normocytic anemia - Anemia of chronic kidney disease.  S/P 1 unit PRBC.  History of small bowel AVMs/PUD.   - Continue Protonix  - IV iron  and Epogen infusion as per nephrology discretion - Follow hemoglobin and transfuse for level less than 7.5 - No overt bleeding.   BL pleural effusion (POA) - Likely secondary to  HFpEF/ESRD/volume overload.  Status post thoracentesis by IR 6/30 and 7/6 (- ).   -Repeated CXR on 7/11 demonstrating improvement in aeration, but still vascular congestion and moderate effusion -fluid analysis suggesting the presence of transudate.      Chronic hyponatremia - Secondary to ESRD.  Nephrology on board. -Continue to follow electrolytes.   Hypertension - Normotensive.   - Continue Coreg .   Polysubstance abuse - Cocaine positive on presentation.   - Tobacco cessation counseling provided - Continue nicotine  patch.   Homelessness - Complicating overall care and assistance.   -TOC involved.   Goals of care - Disposition difficult given hemodialysis and homelessness.  Eventually will likely be discharged back to community, ministry shelter/[place] house.  Pt states she will be able to acquire a phone after discharge.   -continue supportive care.  Subjective:  Afebrile, no chest pain, no nausea or vomiting.  Reports feeling short winded with activity and is requiring 3 L nasal cannula supplementation.  Plan for hemodialysis later today.  Physical Exam: Vitals:   09/26/23 1720 09/26/23 2009 09/26/23 2020 09/27/23 0708  BP:   (!) 170/69 (!) 163/71  Pulse:   78 72  Resp:   18 18  Temp:   98.4 F (36.9 C) 98 F (36.7 C)  TempSrc:   Oral Oral  SpO2: 95% 96% 91% 92%  Weight:       General exam: Alert, awake, oriented x 3; in no acute distress. Respiratory system: No using accessory muscles; decreased breath sounds at the bases.  3 L nasal cannula in place. Cardiovascular system:RRR. No rubs or gallops; no JVD. Gastrointestinal system: Abdomen  is nondistended, soft and nontender. No organomegaly or masses felt. Normal bowel sounds heard. Central nervous system: Moving 4 limbs spontaneously.  No focal neurological deficits. Extremities: No cyanosis or clubbing. Skin: No petechiae. Psychiatry: Judgement and insight appear normal. Mood & affect appropriate.    Data  Reviewed: CBC: White blood cell 7.5, hemoglobin 8.3, platelets count 343K Mg: 2.1 Comprehensive metabolic panel: Sodium 135, potassium 4.7, chloride 97, bicarb 26, BUN 49, creatinine 3.58 and GFR 14   Family Communication: Met at bedside.  Disposition: Status is: Inpatient Remains inpatient appropriate because: Continue volume stabilization and dialysis treatment.  Time spent: 50 minutes  Author: Eric Nunnery, MD 09/27/2023 3:26 PM  For on call review [email or web address].

## 2023-09-27 NOTE — Progress Notes (Addendum)
 [facility] Progress Note   Subjective: Seen in room. On room air. Eating breakfast. No c/os. Denies cp, sob. For dialysis today   Objective Vitals:   09/26/23 1720 09/26/23 2009 09/26/23 2020 09/27/23 0708  BP:   (!) 170/69 (!) 163/71  Pulse:   78 72  Resp:   18 18  Temp:   98.4 F (36.9 C) 98 F (36.7 C)  TempSrc:   Oral Oral  SpO2: 95% 96% 91% 92%  Weight:        Additional Objective Labs: Basic Metabolic Panel: Recent Labs  Lab 09/24/23 0431 09/25/23 0542 09/27/23 0540  NA 131* 134* 135  K 4.3 4.9 4.7  CL 95* 93* 97*  CO2 29 28 26   GLUCOSE 93 90 87  BUN 28* 43* 49*  CREATININE 2.57* 3.41* 3.58*  CALCIUM  7.9* 9.0 8.8*  PHOS 3.4 3.9 3.8   CBC: Recent Labs  Lab 09/21/23 0324 09/22/23 0421 09/24/23 0431 09/24/23 1337 09/25/23 0935 09/27/23 0540  WBC 5.8 5.2 6.0  --  7.9 7.5  HGB 7.3* 7.3* 6.8* 8.2* 8.1* 8.3*  HCT 23.6* 23.8* 21.8* 26.4* 26.5* 26.5*  MCV 94.4 95.6 95.6  --  96.4 96.7  PLT 313 307 319  --  234 343   Blood Culture    Component Value Date/Time   SDES BLOOD LEFT UPPER ARM 06/24/2023 1641   SDES BLOOD SITE NOT SPECIFIED 06/24/2023 1641   SPECREQUEST  06/24/2023 1641    BOTTLES DRAWN AEROBIC AND ANAEROBIC Blood Culture adequate volume   SPECREQUEST  06/24/2023 1641    BOTTLES DRAWN AEROBIC AND ANAEROBIC Blood Culture results may not be optimal due to an inadequate volume of blood received in culture bottles   CULT  06/24/2023 1641    NO GROWTH 5 DAYS Performed at [facility], [address] [place], [place] [address]    CULT  06/24/2023 1641    NO GROWTH 5 DAYS Performed at [facility], [address], [place], [place] [address]    REPTSTATUS 06/29/2023 FINAL 06/24/2023 1641   REPTSTATUS 06/29/2023 FINAL 06/24/2023 1641     Physical Exam General: Alert, chronically ill appearing, nad Heart: RRR Lungs: Diminished, poor air movement  Abdomen: non tender  Extremities: Trace LE edema; hip edema   Dialysis Access: AVF   Medications:  iron  sucrose 500 mg (09/23/23 1447)    (feeding supplement) PROSource Plus  30 mL Oral BID BM   atorvastatin   10 mg Oral Daily   budesonide -glycopyrrolate -formoterol   2 puff Inhalation BID   calcitRIOL   0.25 mcg Oral Daily   carvedilol   6.25 mg Oral BID WC   Chlorhexidine  Gluconate Cloth  6 each Topical Q0600   darbepoetin (ARANESP ) injection - DIALYSIS  200 mcg Subcutaneous Q Tue-1800   feeding supplement (NEPRO CARB STEADY)  237 mL Oral BID BM   ferrous sulfate   325 mg Oral Q breakfast   gabapentin   100 mg Oral TID   heparin   5,000 Units Subcutaneous Q8H   lidocaine   1 patch Transdermal Q24H   lidocaine  (PF)  8 mL Intradermal Once   multivitamin  1 tablet Oral QHS   nicotine   21 mg Transdermal Daily   pantoprazole   40 mg Oral BID   sertraline   25 mg Oral Daily   sodium bicarbonate   1,300 mg Oral BID   sodium chloride  flush  3 mL Intravenous Q12H   torsemide   100 mg Oral Daily    No OP Clinic 3- 3.5  hours  2.0 K  BFR 400   AVF Dry wt not clear   Assessment/Plan: Volume overload: Improved with dialysis. Continue to lower volume with HD. Will reinforce fluid restriction of 1200 cc/d.  Pleural Effusion-S/P thoracentesis 6/30. Management per primary Ascites: not sure if has had paracentesis ever ESRD - No OP HD clinic, using TTS schedule here. HD today. Anemia. Hbg 7-8s. S/p multiple transfusions since admission.  S/p ESA and IV Fe here. Continue Aranesp  200 q week. Transfuse prn Hgb < 7.  HTN: BP stable on coreg , torsemide  COPD Social issues. Homeless. H/o substance abuse. CM/SW involved.  Dispo- ok for discharge after dialysis today. Addendum: f/u CXR yest still looks wet w/ bilat effusions not improving, and today RA SpO2 is in the high 80's per pmd. Not ready for d/c from renal standpoint. She is awaiting dialysis.   Maisie Ronnald Acosta PA-C [facility] 09/27/2023,11:02 AM  Pt seen, examined and agree w A/P as above.   Myer Fret MD  [facility] 09/27/2023, 2:49 PM

## 2023-09-27 NOTE — Plan of Care (Signed)

## 2023-09-27 NOTE — Progress Notes (Signed)
 SATURATION QUALIFICATIONS: (This note is used to comply with regulatory documentation for home oxygen)  Patient Saturations on Room Air at Rest = 82%  Patient Saturations on Room Air while Ambulating = 80%  Patient Saturations on 3 Liters of oxygen while Ambulating = 95%  Please briefly explain why patient needs home oxygen: pt. Becomes short of breath with any exertion  [name]

## 2023-09-29 ENCOUNTER — Telehealth: Payer: Self-pay

## 2023-09-29 NOTE — Transitions of Care (Post Inpatient/ED Visit) (Signed)
   09/29/2023  Name: [name] MRN: [id number] DOB: 12/02/1960  Today's TOC FU Call Status: Today's TOC FU Call Status:: Unsuccessful Call (1st Attempt) Unsuccessful Call (1st Attempt) Date: 09/29/23  Attempted to reach the patient regarding the most recent Inpatient/ED visit.  Follow Up Plan: Additional outreach attempts will be made to reach the patient to complete the Transitions of Care (Post Inpatient/ED visit) call.   Signature  Slater Diesel, RN

## 2023-09-30 ENCOUNTER — Telehealth: Payer: Self-pay

## 2023-09-30 NOTE — Transitions of Care (Post Inpatient/ED Visit) (Signed)
   09/30/2023  Name: [name] MRN: [id number] DOB: [birth year]  Today's TOC FU Call Status: Today's TOC FU Call Status:: Unsuccessful Call (2nd Attempt) Unsuccessful Call (1st Attempt) Date: 09/29/23 Unsuccessful Call (2nd Attempt) Date: 09/30/23  Attempted to reach the patient regarding the most recent Inpatient/ED visit.  Follow Up Plan: No further outreach attempts will be made at this time. We have been unable to contact the patient.  Patient's daughter, [name], answered, and this is the only number on patient's account: [phone].  [name] said she is aware of that. She stated that her number is also associated with her mother's MyChart.  There is no DPR on file to speak to [facility].   [name] said that she has no idea where her mother is, how she is or where she is staying. She stated that her mother called her once while she was in the hospital but she has not heard from her since then.    I asked her to please have her mother call me back if/when she hears from her.   Signature Slater Diesel, RN

## 2023-10-05 ENCOUNTER — Encounter (HOSPITAL_COMMUNITY): Payer: Self-pay | Admitting: Emergency Medicine

## 2023-10-05 ENCOUNTER — Emergency Department (HOSPITAL_COMMUNITY)
Admission: EM | Admit: 2023-10-05 | Discharge: 2023-10-05 | Disposition: A | Discharge status: Home / Self Care | Payer: Self-pay | Attending: Emergency Medicine | Admitting: Emergency Medicine

## 2023-10-05 ENCOUNTER — Other Ambulatory Visit: Payer: Self-pay

## 2023-10-05 ENCOUNTER — Emergency Department (HOSPITAL_COMMUNITY): Payer: Self-pay

## 2023-10-05 DIAGNOSIS — Z992 Dependence on renal dialysis: Secondary | ICD-10-CM | POA: Insufficient documentation

## 2023-10-05 DIAGNOSIS — J441 Chronic obstructive pulmonary disease with (acute) exacerbation: Secondary | ICD-10-CM | POA: Insufficient documentation

## 2023-10-05 DIAGNOSIS — E875 Hyperkalemia: Secondary | ICD-10-CM | POA: Insufficient documentation

## 2023-10-05 DIAGNOSIS — R0602 Shortness of breath: Secondary | ICD-10-CM

## 2023-10-05 DIAGNOSIS — N186 End stage renal disease: Secondary | ICD-10-CM | POA: Insufficient documentation

## 2023-10-05 LAB — CBC WITH DIFFERENTIAL/PLATELET
Abs Immature Granulocytes: 0.05 K/uL (ref 0.00–0.07)
Basophils Absolute: 0.1 K/uL (ref 0.0–0.1)
Basophils Relative: 1 %
Eosinophils Absolute: 0.3 K/uL (ref 0.0–0.5)
Eosinophils Relative: 4 %
HCT: 30.7 % — ABNORMAL LOW (ref 36.0–46.0)
Hemoglobin: 9.4 g/dL — ABNORMAL LOW (ref 12.0–15.0)
Immature Granulocytes: 1 %
Lymphocytes Relative: 17 %
Lymphs Abs: 1.1 K/uL (ref 0.7–4.0)
MCH: 29.9 pg (ref 26.0–34.0)
MCHC: 30.6 g/dL (ref 30.0–36.0)
MCV: 97.8 fL (ref 80.0–100.0)
Monocytes Absolute: 0.5 K/uL (ref 0.1–1.0)
Monocytes Relative: 7 %
Neutro Abs: 4.4 K/uL (ref 1.7–7.7)
Neutrophils Relative %: 70 %
Platelets: 373 K/uL (ref 150–400)
RBC: 3.14 MIL/uL — ABNORMAL LOW (ref 3.87–5.11)
RDW: 19.8 % — ABNORMAL HIGH (ref 11.5–15.5)
WBC: 6.3 K/uL (ref 4.0–10.5)
nRBC: 0 % (ref 0.0–0.2)

## 2023-10-05 LAB — RESP PANEL BY RT-PCR (RSV, FLU A&B, COVID)  RVPGX2
Influenza A by PCR: NEGATIVE
Influenza B by PCR: NEGATIVE
Resp Syncytial Virus by PCR: NEGATIVE
SARS Coronavirus 2 by RT PCR: NEGATIVE

## 2023-10-05 LAB — CBG MONITORING, ED: Glucose-Capillary: 126 mg/dL — ABNORMAL HIGH (ref 70–99)

## 2023-10-05 LAB — BASIC METABOLIC PANEL WITH GFR
Anion gap: 14 (ref 5–15)
BUN: 58 mg/dL — ABNORMAL HIGH (ref 8–23)
CO2: 14 mmol/L — ABNORMAL LOW (ref 22–32)
Calcium: 8.5 mg/dL — ABNORMAL LOW (ref 8.9–10.3)
Chloride: 105 mmol/L (ref 98–111)
Creatinine, Ser: 4.91 mg/dL — ABNORMAL HIGH (ref 0.44–1.00)
GFR, Estimated: 9 mL/min — ABNORMAL LOW (ref >60)
Glucose, Bld: 117 mg/dL — ABNORMAL HIGH (ref 70–99)
Potassium: 5.5 mmol/L — ABNORMAL HIGH (ref 3.5–5.1)
Sodium: 133 mmol/L — ABNORMAL LOW (ref 135–145)

## 2023-10-05 LAB — TROPONIN I (HIGH SENSITIVITY): Troponin I (High Sensitivity): 21 ng/L — ABNORMAL HIGH (ref <18)

## 2023-10-05 LAB — BRAIN NATRIURETIC PEPTIDE: B Natriuretic Peptide: 882 pg/mL — ABNORMAL HIGH (ref 0.0–100.0)

## 2023-10-05 MED ORDER — FENTANYL CITRATE PF 50 MCG/ML IJ SOSY
50.0000 ug | PREFILLED_SYRINGE | Freq: Once | INTRAMUSCULAR | Status: AC
  Administered 2023-10-05: 50 ug via INTRAVENOUS
  Filled 2023-10-05: qty 1

## 2023-10-05 MED ORDER — ANTICOAGULANT SODIUM CITRATE 4% (200MG/5ML) IV SOLN: 5.0000 mL | Status: DC | PRN

## 2023-10-05 MED ORDER — GUAIFENESIN 100 MG/5ML PO LIQD
5.0000 mL | Freq: Once | ORAL | Status: AC
  Administered 2023-10-05: 5 mL via ORAL
  Filled 2023-10-05: qty 10

## 2023-10-05 MED ORDER — ACETAMINOPHEN 325 MG PO TABS
650.0000 mg | ORAL_TABLET | Freq: Four times a day (QID) | ORAL | Status: DC | PRN
  Administered 2023-10-05: 650 mg via ORAL

## 2023-10-05 MED ORDER — ALTEPLASE 2 MG IJ SOLR: 2.0000 mg | Freq: Once | INTRAMUSCULAR | Status: DC | PRN

## 2023-10-05 MED ORDER — PENTAFLUOROPROP-TETRAFLUOROETH EX AERO: 1.0000 | INHALATION_SPRAY | CUTANEOUS | Status: DC | PRN

## 2023-10-05 MED ORDER — ACETAMINOPHEN 325 MG PO TABS
ORAL_TABLET | ORAL | Status: AC
  Filled 2023-10-05: qty 2

## 2023-10-05 MED ORDER — ALBUTEROL SULFATE HFA 108 (90 BASE) MCG/ACT IN AERS
2.0000 | INHALATION_SPRAY | Freq: Four times a day (QID) | RESPIRATORY_TRACT | Status: DC | PRN
  Administered 2023-10-05: 2 via RESPIRATORY_TRACT
  Filled 2023-10-05: qty 6.7

## 2023-10-05 MED ORDER — SODIUM ZIRCONIUM CYCLOSILICATE 10 G PO PACK
10.0000 g | PACK | Freq: Once | ORAL | Status: AC
  Administered 2023-10-05: 10 g via ORAL
  Filled 2023-10-05: qty 1

## 2023-10-05 MED ORDER — HEPARIN SODIUM (PORCINE) 1000 UNIT/ML DIALYSIS: 20.0000 [IU]/kg | INTRAMUSCULAR | Status: DC | PRN

## 2023-10-05 MED ORDER — HEPARIN SODIUM (PORCINE) 1000 UNIT/ML DIALYSIS: 1000.0000 [IU] | INTRAMUSCULAR | Status: DC | PRN

## 2023-10-05 MED ORDER — LIDOCAINE HCL (PF) 1 % IJ SOLN: 5.0000 mL | INTRAMUSCULAR | Status: DC | PRN

## 2023-10-05 MED ORDER — INSULIN ASPART 100 UNIT/ML IV SOLN
5.0000 [IU] | Freq: Once | INTRAVENOUS | Status: AC
  Administered 2023-10-05: 5 [IU] via INTRAVENOUS

## 2023-10-05 MED ORDER — LACTATED RINGERS IV BOLUS
500.0000 mL | Freq: Once | INTRAVENOUS | Status: AC
  Administered 2023-10-05: 500 mL via INTRAVENOUS

## 2023-10-05 MED ORDER — OXYCODONE HCL 5 MG PO TABS
5.0000 mg | ORAL_TABLET | Freq: Once | ORAL | Status: AC
  Administered 2023-10-05: 5 mg via ORAL
  Filled 2023-10-05: qty 1

## 2023-10-05 MED ORDER — CHLORHEXIDINE GLUCONATE CLOTH 2 % EX PADS: 6.0000 | MEDICATED_PAD | Freq: Every day | CUTANEOUS | Status: DC

## 2023-10-05 MED ORDER — LIDOCAINE-PRILOCAINE 2.5-2.5 % EX CREA: 1.0000 | TOPICAL_CREAM | CUTANEOUS | Status: DC | PRN

## 2023-10-05 NOTE — ED Provider Notes (Signed)
 [facility]ERGENC[facility] DEPARTMENT AT [facility] Provider Note   CSN: [id number] Arrival date & time: 10/05/23  0018     History Chief Complaint  Patient presents with   Shortness of Breath    HPI [name] is a 63 y.o. female presenting for SOB x 4 days. Worsened SOB and CP today HX of similar. States that she feels it is a COPD exacerbation with worsening productive cough   Patient's recorded medical, surgical, social, medication list and allergies were reviewed in the Snapshot window as part of the initial history.   Review of Systems   Review of Systems  Physical Exam Updated Vital Signs BP (!) 174/84   Pulse (!) 101   Temp 98 F (36.7 C)   Resp (!) 25   Ht 5' 7 (1.702 m)   Wt 57.6 kg   SpO2 97%   BMI 19.89 kg/m  Physical Exam   ED Course/ Medical Decision Making/ A&P    Procedures .Critical Care  Performed by: Jerral Meth, MD Authorized by: Jerral Meth, MD   Critical care provider statement:    Critical care time (minutes):  30   Critical care was necessary to treat or prevent imminent or life-threatening deterioration of the following conditions:  Metabolic crisis (Hyperkalmeia needing immediated HD)   Critical care was time spent personally by me on the following activities:  Development of treatment plan with patient or surrogate, discussions with consultants, evaluation of patient's response to treatment, examination of patient, ordering and review of laboratory studies, ordering and review of radiographic studies, ordering and performing treatments and interventions, pulse oximetry, re-evaluation of patient's condition and review of old charts    Medications Ordered in ED Medications  sodium zirconium cyclosilicate  (LOKELMA ) packet 10 g (has no administration in time range)  insulin  aspart (novoLOG ) injection 5 Units (has no administration in time range)  lactated ringers  bolus 500 mL (0 mLs Intravenous Stopped 10/05/23  0247)  fentaNYL  (SUBLIMAZE ) injection 50 mcg (50 mcg Intravenous Given 10/05/23 0124)    Medical Decision Making:   Patient presenting with acute shortness of breath.  63 year old female, expansive medical history.  History of similar.  ESRD and COPD.  Coming in with ongoing shortness of breath.  Got out of the hospital a week ago, gradually improved in the outpatient setting until 2 days ago where she started feeling swollen and short of breath. She is concerned for recurrence of either COPD exacerbation or fluid overload.  Denies fevers chills nausea vomiting syncope or chest pain at this time.  Otherwise ambulatory tolerating p.o. intake.  Reassessment: On reassessment, lab work shows a mild hyperkalemia which was treated with insulin  dextrose  and Lokelma .  She also has evidence of interstitial edema on her chest x-ray.  This is likely secondary to her ESRD and need for dialysis as well as heart failure. Consulted nephrology who is aware of the patient.  She patient will be dialyzed in a.m. and brought back to the emergency room for reassessment.  Anticipate discharge as her syndrome is grossly reassuring otherwise outside of need for dialysis.  Emergency Department Medication Summary:   Medications  sodium zirconium cyclosilicate  (LOKELMA ) packet 10 g (has no administration in time range)  insulin  aspart (novoLOG ) injection 5 Units (has no administration in time range)  lactated ringers  bolus 500 mL (0 mLs Intravenous Stopped 10/05/23 0247)  fentaNYL  (SUBLIMAZE ) injection 50 mcg (50 mcg Intravenous Given 10/05/23 0124)         Clinical Impression:  1. COPD exacerbation (HCC)      Data Unavailable   Final Clinical Impression(s) / ED Diagnoses Final diagnoses:  COPD exacerbation [facility])    Rx / DC Orders ED Discharge Orders     None         Jerral Meth, MD 10/05/23 [phone]

## 2023-10-05 NOTE — Progress Notes (Signed)
   10/05/23 1228  Vitals  Temp 98 F (36.7 C)  Pulse Rate 96  Resp (!) 21  BP (!) 167/92  SpO2 99 %  O2 Device Nasal Cannula  Weight  (unable to weight)  Type of Weight Post-Dialysis  Oxygen Therapy  Patient Activity (if Appropriate) In bed  Pulse Oximetry Type Continuous  Post Treatment  Dialyzer Clearance Lightly streaked  Liters Processed 66.5  Fluid Removed (mL) 3900 mL  Tolerated HD Treatment Yes   Received patient in bed to unit.  Alert and oriented.  Informed consent signed and in chart.    TX duration:3 hrs   Patient tolerated well.  Transported back to the room  Alert, without acute distress.  Hand-off given to patient's nurse.    Access used: AVF Access issues: none   Total UF removed: 3.9L Medication(s) given: see eMAR   Woodfin Dolores RN [facility]

## 2023-10-05 NOTE — Procedures (Signed)
 Patient was seen on dialysis and the procedure was supervised.  BFR 400  Via TDC BP is  171/83.   Patient appears to be tolerating treatment well  [name] 10/05/2023

## 2023-10-05 NOTE — ED Notes (Signed)
 Nt called CCMD@4 :04am

## 2023-10-05 NOTE — ED Provider Notes (Signed)
 Signout from Dr. Jerral.  63 year old female with history of end-stage renal disease on dialysis.  Here for fluid overload and needing dialysis.  Potassium elevated and given Lokelma .  Plan is to reassess after dialysis. Physical Exam  BP (!) 173/86   Pulse 92   Temp 98.1 F (36.7 C) (Oral)   Resp (!) 23   Ht 5' 7 (1.702 m)   Wt 57.6 kg   SpO2 100%   BMI 19.89 kg/m   Physical Exam  Procedures  Procedures  ED Course / MDM    Medical Decision Making Amount and/or Complexity of Data Reviewed Labs: ordered. Radiology: ordered.  Risk OTC drugs. Prescription drug management. Decision regarding hospitalization.   1322.  Patient has returned from dialysis.  She said she needs her pain medicine.  She also wants a cab to go home.       Towana Ozell BROCKS, MD 10/05/23 [phone]

## 2023-10-05 NOTE — ED Notes (Signed)
 Pt continues to c/o feeling short of breath

## 2023-10-05 NOTE — ED Triage Notes (Signed)
 PT BIB [facility] with a report of sob. PT has missed dialysis for the last week. Lower lung sounds are diminished on both sides. PT given 7.5 albuterol , atro  and 125mg  solu medrol . PT capnography was 22, HR 109 and PT has a productive cough with green sputum.PT is 100% RA.

## 2023-10-05 NOTE — Progress Notes (Signed)
 Brief nephology note:  63 y/o F p/w SOB after missing HD for a week. Spoke with EDP and reviewed chart including lab data and CXR with pulm edema and pleural effusion. HD ordered, spoke with HD nurse.  [name] [facility]

## 2023-10-15 ENCOUNTER — Other Ambulatory Visit: Payer: Self-pay

## 2023-10-15 ENCOUNTER — Other Ambulatory Visit: Payer: Self-pay | Admitting: Internal Medicine

## 2023-10-15 DIAGNOSIS — G8929 Other chronic pain: Secondary | ICD-10-CM

## 2023-10-15 MED ORDER — CARVEDILOL 6.25 MG PO TABS
6.2500 mg | ORAL_TABLET | Freq: Two times a day (BID) | ORAL | 0 refills | Status: DC
  Filled 2023-10-15: qty 180, 90d supply, fill #0

## 2023-10-15 MED ORDER — TORSEMIDE 100 MG PO TABS
100.0000 mg | ORAL_TABLET | Freq: Every day | ORAL | 0 refills | Status: DC
  Filled 2023-10-15: qty 90, 90d supply, fill #0

## 2023-10-15 MED ORDER — FERROUS SULFATE 325 (65 FE) MG PO TABS
325.0000 mg | ORAL_TABLET | Freq: Every day | ORAL | 0 refills | Status: DC
  Filled 2023-10-15: qty 100, 100d supply, fill #0

## 2023-10-15 MED ORDER — SODIUM BICARBONATE 650 MG PO TABS
1300.0000 mg | ORAL_TABLET | Freq: Two times a day (BID) | ORAL | 0 refills | Status: DC
  Filled 2023-10-15: qty 360, 90d supply, fill #0

## 2023-10-15 MED ORDER — GABAPENTIN 100 MG PO CAPS
100.0000 mg | ORAL_CAPSULE | Freq: Three times a day (TID) | ORAL | 0 refills | Status: DC
  Filled 2023-10-15: qty 270, 90d supply, fill #0

## 2023-10-22 ENCOUNTER — Emergency Department (HOSPITAL_COMMUNITY)

## 2023-10-22 ENCOUNTER — Inpatient Hospital Stay (HOSPITAL_COMMUNITY)
Admission: EM | Admit: 2023-10-22 | Discharge: 2023-11-02 | DRG: 291 | Disposition: A | Discharge status: Home / Self Care | Attending: Internal Medicine | Admitting: Internal Medicine

## 2023-10-22 ENCOUNTER — Encounter (HOSPITAL_COMMUNITY): Payer: Self-pay

## 2023-10-22 ENCOUNTER — Other Ambulatory Visit: Payer: Self-pay

## 2023-10-22 DIAGNOSIS — Z79899 Other long term (current) drug therapy: Secondary | ICD-10-CM

## 2023-10-22 DIAGNOSIS — E876 Hypokalemia: Secondary | ICD-10-CM | POA: Diagnosis present

## 2023-10-22 DIAGNOSIS — Z8616 Personal history of COVID-19: Secondary | ICD-10-CM

## 2023-10-22 DIAGNOSIS — Z560 Unemployment, unspecified: Secondary | ICD-10-CM

## 2023-10-22 DIAGNOSIS — R011 Cardiac murmur, unspecified: Secondary | ICD-10-CM | POA: Diagnosis present

## 2023-10-22 DIAGNOSIS — D649 Anemia, unspecified: Secondary | ICD-10-CM | POA: Diagnosis present

## 2023-10-22 DIAGNOSIS — Z8701 Personal history of pneumonia (recurrent): Secondary | ICD-10-CM

## 2023-10-22 DIAGNOSIS — R0603 Acute respiratory distress: Principal | ICD-10-CM

## 2023-10-22 DIAGNOSIS — F17210 Nicotine dependence, cigarettes, uncomplicated: Secondary | ICD-10-CM | POA: Diagnosis present

## 2023-10-22 DIAGNOSIS — Z888 Allergy status to other drugs, medicaments and biological substances status: Secondary | ICD-10-CM

## 2023-10-22 DIAGNOSIS — J449 Chronic obstructive pulmonary disease, unspecified: Secondary | ICD-10-CM

## 2023-10-22 DIAGNOSIS — E872 Acidosis, unspecified: Secondary | ICD-10-CM | POA: Diagnosis present

## 2023-10-22 DIAGNOSIS — E1122 Type 2 diabetes mellitus with diabetic chronic kidney disease: Secondary | ICD-10-CM | POA: Diagnosis present

## 2023-10-22 DIAGNOSIS — B1920 Unspecified viral hepatitis C without hepatic coma: Secondary | ICD-10-CM | POA: Diagnosis present

## 2023-10-22 DIAGNOSIS — G894 Chronic pain syndrome: Secondary | ICD-10-CM | POA: Diagnosis present

## 2023-10-22 DIAGNOSIS — I5A Non-ischemic myocardial injury (non-traumatic): Secondary | ICD-10-CM | POA: Diagnosis present

## 2023-10-22 DIAGNOSIS — D62 Acute posthemorrhagic anemia: Secondary | ICD-10-CM | POA: Diagnosis present

## 2023-10-22 DIAGNOSIS — E785 Hyperlipidemia, unspecified: Secondary | ICD-10-CM | POA: Diagnosis present

## 2023-10-22 DIAGNOSIS — I2489 Other forms of acute ischemic heart disease: Secondary | ICD-10-CM | POA: Diagnosis present

## 2023-10-22 DIAGNOSIS — R944 Abnormal results of kidney function studies: Secondary | ICD-10-CM | POA: Diagnosis present

## 2023-10-22 DIAGNOSIS — Z681 Body mass index (BMI) 19 or less, adult: Secondary | ICD-10-CM

## 2023-10-22 DIAGNOSIS — I132 Hypertensive heart and chronic kidney disease with heart failure and with stage 5 chronic kidney disease, or end stage renal disease: Principal | ICD-10-CM | POA: Diagnosis present

## 2023-10-22 DIAGNOSIS — F1410 Cocaine abuse, uncomplicated: Secondary | ICD-10-CM | POA: Diagnosis present

## 2023-10-22 DIAGNOSIS — Z992 Dependence on renal dialysis: Secondary | ICD-10-CM

## 2023-10-22 DIAGNOSIS — Z833 Family history of diabetes mellitus: Secondary | ICD-10-CM

## 2023-10-22 DIAGNOSIS — E8770 Fluid overload, unspecified: Secondary | ICD-10-CM | POA: Diagnosis present

## 2023-10-22 DIAGNOSIS — Z7951 Long term (current) use of inhaled steroids: Secondary | ICD-10-CM

## 2023-10-22 DIAGNOSIS — Z5948 Other specified lack of adequate food: Secondary | ICD-10-CM

## 2023-10-22 DIAGNOSIS — Z91158 Patient's noncompliance with renal dialysis for other reason: Secondary | ICD-10-CM

## 2023-10-22 DIAGNOSIS — E871 Hypo-osmolality and hyponatremia: Secondary | ICD-10-CM | POA: Diagnosis present

## 2023-10-22 DIAGNOSIS — R188 Other ascites: Secondary | ICD-10-CM | POA: Diagnosis present

## 2023-10-22 DIAGNOSIS — J9601 Acute respiratory failure with hypoxia: Secondary | ICD-10-CM | POA: Diagnosis present

## 2023-10-22 DIAGNOSIS — Z860101 Personal history of adenomatous and serrated colon polyps: Secondary | ICD-10-CM

## 2023-10-22 DIAGNOSIS — K567 Ileus, unspecified: Secondary | ICD-10-CM | POA: Diagnosis present

## 2023-10-22 DIAGNOSIS — E43 Unspecified severe protein-calorie malnutrition: Secondary | ICD-10-CM | POA: Diagnosis present

## 2023-10-22 DIAGNOSIS — K3189 Other diseases of stomach and duodenum: Secondary | ICD-10-CM | POA: Diagnosis present

## 2023-10-22 DIAGNOSIS — K2100 Gastro-esophageal reflux disease with esophagitis, without bleeding: Secondary | ICD-10-CM | POA: Diagnosis present

## 2023-10-22 DIAGNOSIS — Z5902 Unsheltered homelessness: Secondary | ICD-10-CM

## 2023-10-22 DIAGNOSIS — R9431 Abnormal electrocardiogram [ECG] [EKG]: Secondary | ICD-10-CM | POA: Diagnosis present

## 2023-10-22 DIAGNOSIS — M1990 Unspecified osteoarthritis, unspecified site: Secondary | ICD-10-CM | POA: Diagnosis present

## 2023-10-22 DIAGNOSIS — F32A Depression, unspecified: Secondary | ICD-10-CM | POA: Diagnosis present

## 2023-10-22 DIAGNOSIS — F39 Unspecified mood [affective] disorder: Secondary | ICD-10-CM | POA: Diagnosis present

## 2023-10-22 DIAGNOSIS — I509 Heart failure, unspecified: Secondary | ICD-10-CM

## 2023-10-22 DIAGNOSIS — Z8711 Personal history of peptic ulcer disease: Secondary | ICD-10-CM

## 2023-10-22 DIAGNOSIS — I5033 Acute on chronic diastolic (congestive) heart failure: Secondary | ICD-10-CM | POA: Diagnosis present

## 2023-10-22 DIAGNOSIS — Z5986 Financial insecurity: Secondary | ICD-10-CM

## 2023-10-22 DIAGNOSIS — Z5941 Food insecurity: Secondary | ICD-10-CM

## 2023-10-22 DIAGNOSIS — Z91411 Personal history of adult psychological abuse: Secondary | ICD-10-CM

## 2023-10-22 DIAGNOSIS — N186 End stage renal disease: Secondary | ICD-10-CM | POA: Diagnosis present

## 2023-10-22 DIAGNOSIS — I10 Essential (primary) hypertension: Secondary | ICD-10-CM

## 2023-10-22 DIAGNOSIS — R001 Bradycardia, unspecified: Secondary | ICD-10-CM | POA: Diagnosis not present

## 2023-10-22 DIAGNOSIS — K31819 Angiodysplasia of stomach and duodenum without bleeding: Secondary | ICD-10-CM | POA: Diagnosis present

## 2023-10-22 DIAGNOSIS — J4489 Other specified chronic obstructive pulmonary disease: Secondary | ICD-10-CM | POA: Diagnosis present

## 2023-10-22 DIAGNOSIS — K31811 Angiodysplasia of stomach and duodenum with bleeding: Secondary | ICD-10-CM | POA: Diagnosis present

## 2023-10-22 DIAGNOSIS — F419 Anxiety disorder, unspecified: Secondary | ICD-10-CM | POA: Diagnosis present

## 2023-10-22 DIAGNOSIS — R0600 Dyspnea, unspecified: Secondary | ICD-10-CM | POA: Diagnosis not present

## 2023-10-22 DIAGNOSIS — Z5982 Transportation insecurity: Secondary | ICD-10-CM

## 2023-10-22 DIAGNOSIS — Z1152 Encounter for screening for COVID-19: Secondary | ICD-10-CM

## 2023-10-22 DIAGNOSIS — G8929 Other chronic pain: Secondary | ICD-10-CM

## 2023-10-22 MED ORDER — METHYLPREDNISOLONE SODIUM SUCC 125 MG IJ SOLR
125.0000 mg | Freq: Once | INTRAMUSCULAR | Status: AC
  Administered 2023-10-22: 125 mg via INTRAVENOUS
  Filled 2023-10-22: qty 2

## 2023-10-22 MED ORDER — DIAZEPAM 5 MG/ML IJ SOLN
2.5000 mg | Freq: Once | INTRAMUSCULAR | Status: AC
  Administered 2023-10-22: 2.5 mg via INTRAVENOUS
  Filled 2023-10-22: qty 2
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
## 2023-10-22 NOTE — ED Triage Notes (Signed)
 PT brought in by [facility] for resp distress, PT has missed last 3 weeks of dialysis per PT amd EMS due to being homeless zofran  given enroute. PT was 52% per EMS upon arrival and came up to 95% on CPAP. PT alert and talking and placed on bipap

## 2023-10-23 ENCOUNTER — Emergency Department (HOSPITAL_COMMUNITY)

## 2023-10-23 ENCOUNTER — Inpatient Hospital Stay (HOSPITAL_COMMUNITY)

## 2023-10-23 ENCOUNTER — Other Ambulatory Visit: Payer: Self-pay

## 2023-10-23 DIAGNOSIS — F39 Unspecified mood [affective] disorder: Secondary | ICD-10-CM | POA: Diagnosis present

## 2023-10-23 DIAGNOSIS — B1920 Unspecified viral hepatitis C without hepatic coma: Secondary | ICD-10-CM | POA: Diagnosis present

## 2023-10-23 DIAGNOSIS — I2489 Other forms of acute ischemic heart disease: Secondary | ICD-10-CM | POA: Diagnosis present

## 2023-10-23 DIAGNOSIS — E43 Unspecified severe protein-calorie malnutrition: Secondary | ICD-10-CM | POA: Diagnosis present

## 2023-10-23 DIAGNOSIS — I5033 Acute on chronic diastolic (congestive) heart failure: Secondary | ICD-10-CM | POA: Diagnosis present

## 2023-10-23 DIAGNOSIS — E872 Acidosis, unspecified: Secondary | ICD-10-CM | POA: Diagnosis present

## 2023-10-23 DIAGNOSIS — E785 Hyperlipidemia, unspecified: Secondary | ICD-10-CM | POA: Diagnosis present

## 2023-10-23 DIAGNOSIS — Z681 Body mass index (BMI) 19 or less, adult: Secondary | ICD-10-CM | POA: Diagnosis not present

## 2023-10-23 DIAGNOSIS — D62 Acute posthemorrhagic anemia: Secondary | ICD-10-CM

## 2023-10-23 DIAGNOSIS — E8779 Other fluid overload: Secondary | ICD-10-CM

## 2023-10-23 DIAGNOSIS — E871 Hypo-osmolality and hyponatremia: Secondary | ICD-10-CM | POA: Diagnosis present

## 2023-10-23 DIAGNOSIS — E1122 Type 2 diabetes mellitus with diabetic chronic kidney disease: Secondary | ICD-10-CM | POA: Diagnosis present

## 2023-10-23 DIAGNOSIS — K31811 Angiodysplasia of stomach and duodenum with bleeding: Secondary | ICD-10-CM

## 2023-10-23 DIAGNOSIS — I509 Heart failure, unspecified: Secondary | ICD-10-CM

## 2023-10-23 DIAGNOSIS — Z1152 Encounter for screening for COVID-19: Secondary | ICD-10-CM | POA: Diagnosis not present

## 2023-10-23 DIAGNOSIS — Z5902 Unsheltered homelessness: Secondary | ICD-10-CM | POA: Diagnosis not present

## 2023-10-23 DIAGNOSIS — Z992 Dependence on renal dialysis: Secondary | ICD-10-CM | POA: Diagnosis not present

## 2023-10-23 DIAGNOSIS — K31819 Angiodysplasia of stomach and duodenum without bleeding: Secondary | ICD-10-CM

## 2023-10-23 DIAGNOSIS — D649 Anemia, unspecified: Secondary | ICD-10-CM

## 2023-10-23 DIAGNOSIS — R188 Other ascites: Secondary | ICD-10-CM | POA: Diagnosis present

## 2023-10-23 DIAGNOSIS — I132 Hypertensive heart and chronic kidney disease with heart failure and with stage 5 chronic kidney disease, or end stage renal disease: Secondary | ICD-10-CM | POA: Diagnosis present

## 2023-10-23 DIAGNOSIS — N186 End stage renal disease: Secondary | ICD-10-CM | POA: Diagnosis present

## 2023-10-23 DIAGNOSIS — R0600 Dyspnea, unspecified: Secondary | ICD-10-CM | POA: Diagnosis present

## 2023-10-23 DIAGNOSIS — K2289 Other specified disease of esophagus: Secondary | ICD-10-CM

## 2023-10-23 DIAGNOSIS — K567 Ileus, unspecified: Secondary | ICD-10-CM | POA: Diagnosis present

## 2023-10-23 DIAGNOSIS — F32A Depression, unspecified: Secondary | ICD-10-CM | POA: Diagnosis present

## 2023-10-23 DIAGNOSIS — Z8616 Personal history of COVID-19: Secondary | ICD-10-CM | POA: Diagnosis not present

## 2023-10-23 DIAGNOSIS — J9601 Acute respiratory failure with hypoxia: Secondary | ICD-10-CM | POA: Diagnosis present

## 2023-10-23 DIAGNOSIS — E876 Hypokalemia: Secondary | ICD-10-CM | POA: Diagnosis present

## 2023-10-23 DIAGNOSIS — E8770 Fluid overload, unspecified: Secondary | ICD-10-CM | POA: Diagnosis present

## 2023-10-23 DIAGNOSIS — J4489 Other specified chronic obstructive pulmonary disease: Secondary | ICD-10-CM | POA: Diagnosis present

## 2023-10-23 DIAGNOSIS — F1410 Cocaine abuse, uncomplicated: Secondary | ICD-10-CM | POA: Diagnosis present

## 2023-10-23 LAB — CBC WITH DIFFERENTIAL/PLATELET
Abs Granulocyte: 4.1 K/uL (ref 1.5–6.5)
Abs Granulocyte: 5.1 K/uL (ref 1.5–6.5)
Abs Immature Granulocytes: 0.03 K/uL (ref 0.00–0.07)
Abs Immature Granulocytes: 0.05 K/uL (ref 0.00–0.07)
Basophils Absolute: 0 K/uL (ref 0.0–0.1)
Basophils Absolute: 0.1 K/uL (ref 0.0–0.1)
Basophils Relative: 0 %
Basophils Relative: 1 %
Eosinophils Absolute: 0 K/uL (ref 0.0–0.5)
Eosinophils Absolute: 0.2 K/uL (ref 0.0–0.5)
Eosinophils Relative: 0 %
Eosinophils Relative: 4 %
HCT: 17.1 % — ABNORMAL LOW (ref 36.0–46.0)
HCT: 23.3 % — ABNORMAL LOW (ref 36.0–46.0)
Hemoglobin: 5.3 g/dL — CL (ref 12.0–15.0)
Hemoglobin: 7.9 g/dL — ABNORMAL LOW (ref 12.0–15.0)
Immature Granulocytes: 1 %
Immature Granulocytes: 1 %
Lymphocytes Relative: 11 %
Lymphocytes Relative: 21 %
Lymphs Abs: 0.7 K/uL (ref 0.7–4.0)
Lymphs Abs: 1.3 K/uL (ref 0.7–4.0)
MCH: 30.1 pg (ref 26.0–34.0)
MCH: 30.2 pg (ref 26.0–34.0)
MCHC: 31 g/dL (ref 30.0–36.0)
MCHC: 33.9 g/dL (ref 30.0–36.0)
MCV: 88.9 fL (ref 80.0–100.0)
MCV: 97.2 fL (ref 80.0–100.0)
Monocytes Absolute: 0.5 K/uL (ref 0.1–1.0)
Monocytes Absolute: 0.5 K/uL (ref 0.1–1.0)
Monocytes Relative: 7 %
Monocytes Relative: 8 %
Neutro Abs: 4.1 K/uL (ref 1.7–7.7)
Neutro Abs: 5.1 K/uL (ref 1.7–7.7)
Neutrophils Relative %: 65 %
Neutrophils Relative %: 81 %
Platelets: 293 K/uL (ref 150–400)
Platelets: 324 K/uL (ref 150–400)
RBC: 1.76 MIL/uL — ABNORMAL LOW (ref 3.87–5.11)
RBC: 2.62 MIL/uL — ABNORMAL LOW (ref 3.87–5.11)
RDW: 18 % — ABNORMAL HIGH (ref 11.5–15.5)
RDW: 18.4 % — ABNORMAL HIGH (ref 11.5–15.5)
WBC: 6.2 K/uL (ref 4.0–10.5)
WBC: 6.3 K/uL (ref 4.0–10.5)
nRBC: 0 % (ref 0.0–0.2)
nRBC: 0 % (ref 0.0–0.2)

## 2023-10-23 LAB — RESP PANEL BY RT-PCR (RSV, FLU A&B, COVID)  RVPGX2
Influenza A by PCR: NEGATIVE
Influenza B by PCR: NEGATIVE
Resp Syncytial Virus by PCR: NEGATIVE
SARS Coronavirus 2 by RT PCR: NEGATIVE

## 2023-10-23 LAB — I-STAT VENOUS BLOOD GAS, ED
Acid-base deficit: 15 mmol/L — ABNORMAL HIGH (ref 0.0–2.0)
Bicarbonate: 9.5 mmol/L — ABNORMAL LOW (ref 20.0–28.0)
Calcium, Ion: 0.99 mmol/L — ABNORMAL LOW (ref 1.15–1.40)
HCT: 15 % — ABNORMAL LOW (ref 36.0–46.0)
Hemoglobin: 5.1 g/dL — CL (ref 12.0–15.0)
O2 Saturation: 99 %
Potassium: 4.4 mmol/L (ref 3.5–5.1)
Sodium: 131 mmol/L — ABNORMAL LOW (ref 135–145)
TCO2: 10 mmol/L — ABNORMAL LOW (ref 22–32)
pCO2, Ven: 18.7 mmHg — CL (ref 44–60)
pH, Ven: 7.313 (ref 7.25–7.43)
pO2, Ven: 124 mmHg — ABNORMAL HIGH (ref 32–45)

## 2023-10-23 LAB — ECHOCARDIOGRAM COMPLETE
Area-P 1/2: 6.32 cm2
Height: 67 in
MV VTI: 2.9 cm2
S' Lateral: 3.6 cm
Weight: 2240 [oz_av]

## 2023-10-23 LAB — MRSA NEXT GEN BY PCR, NASAL: MRSA by PCR Next Gen: DETECTED — AB

## 2023-10-23 LAB — BRAIN NATRIURETIC PEPTIDE: B Natriuretic Peptide: 794.8 pg/mL — ABNORMAL HIGH (ref 0.0–100.0)

## 2023-10-23 LAB — PREPARE RBC (CROSSMATCH)

## 2023-10-23 LAB — COMPREHENSIVE METABOLIC PANEL WITH GFR
ALT: 12 U/L (ref 0–44)
AST: 18 U/L (ref 15–41)
Albumin: 3 g/dL — ABNORMAL LOW (ref 3.5–5.0)
Alkaline Phosphatase: 83 U/L (ref 38–126)
Anion gap: 21 — ABNORMAL HIGH (ref 5–15)
BUN: 92 mg/dL — ABNORMAL HIGH (ref 8–23)
CO2: 9 mmol/L — ABNORMAL LOW (ref 22–32)
Calcium: 8.1 mg/dL — ABNORMAL LOW (ref 8.9–10.3)
Chloride: 100 mmol/L (ref 98–111)
Creatinine, Ser: 5.59 mg/dL — ABNORMAL HIGH (ref 0.44–1.00)
GFR, Estimated: 8 mL/min — ABNORMAL LOW (ref >60)
Glucose, Bld: 84 mg/dL (ref 70–99)
Potassium: 4.2 mmol/L (ref 3.5–5.1)
Sodium: 130 mmol/L — ABNORMAL LOW (ref 135–145)
Total Bilirubin: 0.6 mg/dL (ref 0.0–1.2)
Total Protein: 6.7 g/dL (ref 6.5–8.1)

## 2023-10-23 LAB — LACTIC ACID, PLASMA: Lactic Acid, Venous: 0.8 mmol/L (ref 0.5–1.9)

## 2023-10-23 LAB — TROPONIN I (HIGH SENSITIVITY)
Troponin I (High Sensitivity): 23 ng/L — ABNORMAL HIGH (ref <18)
Troponin I (High Sensitivity): 22 ng/L — ABNORMAL HIGH (ref <18)

## 2023-10-23 LAB — IRON AND TIBC
Iron: 256 ug/dL — ABNORMAL HIGH (ref 28–170)
Saturation Ratios: 84 % — ABNORMAL HIGH (ref 10.4–31.8)
TIBC: 305 ug/dL (ref 250–450)
UIBC: 49 ug/dL

## 2023-10-23 LAB — HEMOGLOBIN AND HEMATOCRIT, BLOOD
HCT: 26.3 % — ABNORMAL LOW (ref 36.0–46.0)
Hemoglobin: 9.2 g/dL — ABNORMAL LOW (ref 12.0–15.0)

## 2023-10-23 LAB — HEPATITIS B SURFACE ANTIGEN: Hepatitis B Surface Ag: NONREACTIVE

## 2023-10-23 LAB — D-DIMER, QUANTITATIVE: D-Dimer, Quant: 5.21 ug{FEU}/mL — ABNORMAL HIGH (ref 0.00–0.50)

## 2023-10-23 MED ORDER — FERROUS SULFATE 325 (65 FE) MG PO TABS
325.0000 mg | ORAL_TABLET | Freq: Every day | ORAL | Status: DC
  Administered 2023-10-23 – 2023-11-02 (×12): 325 mg via ORAL
  Filled 2023-10-23 (×9): qty 1

## 2023-10-23 MED ORDER — ACETAMINOPHEN 325 MG PO TABS
650.0000 mg | ORAL_TABLET | Freq: Four times a day (QID) | ORAL | Status: DC | PRN
  Administered 2023-10-25 – 2023-11-01 (×8): 650 mg via ORAL
  Filled 2023-10-23 (×6): qty 2

## 2023-10-23 MED ORDER — BUDESON-GLYCOPYRROL-FORMOTEROL 160-9-4.8 MCG/ACT IN AERO
2.0000 | INHALATION_SPRAY | Freq: Two times a day (BID) | RESPIRATORY_TRACT | Status: DC
  Administered 2023-10-24 – 2023-11-02 (×22): 2 via RESPIRATORY_TRACT
  Filled 2023-10-23 (×2): qty 5.9

## 2023-10-23 MED ORDER — ALTEPLASE 2 MG IJ SOLR: 2.0000 mg | Freq: Once | INTRAMUSCULAR | Status: DC | PRN

## 2023-10-23 MED ORDER — NICOTINE 21 MG/24HR TD PT24: 21.0000 mg | MEDICATED_PATCH | Freq: Every day | TRANSDERMAL | Status: DC | PRN

## 2023-10-23 MED ORDER — IOHEXOL 350 MG/ML SOLN
75.0000 mL | Freq: Once | INTRAVENOUS | Status: AC | PRN
  Administered 2023-10-23: 75 mL via INTRAVENOUS

## 2023-10-23 MED ORDER — ALPRAZOLAM 0.25 MG PO TABS
0.2500 mg | ORAL_TABLET | Freq: Two times a day (BID) | ORAL | Status: DC | PRN
  Administered 2023-10-23 – 2023-11-02 (×20): 0.25 mg via ORAL
  Filled 2023-10-23 (×16): qty 1

## 2023-10-23 MED ORDER — IPRATROPIUM-ALBUTEROL 0.5-2.5 (3) MG/3ML IN SOLN
3.0000 mL | Freq: Four times a day (QID) | RESPIRATORY_TRACT | Status: DC | PRN
  Administered 2023-10-25: 3 mL via RESPIRATORY_TRACT
  Filled 2023-10-23: qty 3

## 2023-10-23 MED ORDER — OXYCODONE HCL 5 MG PO TABS
ORAL_TABLET | ORAL | Status: AC
  Filled 2023-10-23: qty 1

## 2023-10-23 MED ORDER — LIDOCAINE-PRILOCAINE 2.5-2.5 % EX CREA: 1.0000 | TOPICAL_CREAM | CUTANEOUS | Status: DC | PRN

## 2023-10-23 MED ORDER — CHLORHEXIDINE GLUCONATE CLOTH 2 % EX PADS
6.0000 | MEDICATED_PAD | Freq: Every day | CUTANEOUS | Status: DC
  Administered 2023-10-23 – 2023-11-02 (×14): 6 via TOPICAL

## 2023-10-23 MED ORDER — PANTOPRAZOLE SODIUM 40 MG IV SOLR: 40.0000 mg | Freq: Two times a day (BID) | INTRAVENOUS | Status: DC

## 2023-10-23 MED ORDER — NICOTINE 21 MG/24HR TD PT24
21.0000 mg | MEDICATED_PATCH | Freq: Every day | TRANSDERMAL | Status: DC
  Administered 2023-10-23 – 2023-11-02 (×14): 21 mg via TRANSDERMAL
  Filled 2023-10-23 (×11): qty 1

## 2023-10-23 MED ORDER — CARVEDILOL 6.25 MG PO TABS
6.2500 mg | ORAL_TABLET | Freq: Two times a day (BID) | ORAL | Status: DC
  Administered 2023-10-24 – 2023-11-02 (×21): 6.25 mg via ORAL
  Filled 2023-10-23 (×15): qty 1
  Filled 2023-10-23: qty 2

## 2023-10-23 MED ORDER — BUSPIRONE HCL 5 MG PO TABS
10.0000 mg | ORAL_TABLET | Freq: Three times a day (TID) | ORAL | Status: DC
  Administered 2023-10-23 – 2023-11-02 (×38): 10 mg via ORAL
  Filled 2023-10-23 (×29): qty 2

## 2023-10-23 MED ORDER — PENTAFLUOROPROP-TETRAFLUOROETH EX AERO: 1.0000 | INHALATION_SPRAY | CUTANEOUS | Status: DC | PRN

## 2023-10-23 MED ORDER — FUROSEMIDE 10 MG/ML IJ SOLN
160.0000 mg | Freq: Two times a day (BID) | INTRAVENOUS | Status: DC
  Administered 2023-10-24 – 2023-11-01 (×21): 160 mg via INTRAVENOUS
  Filled 2023-10-23: qty 10
  Filled 2023-10-23: qty 16
  Filled 2023-10-23: qty 2
  Filled 2023-10-23 (×2): qty 10
  Filled 2023-10-23: qty 2
  Filled 2023-10-23: qty 16
  Filled 2023-10-23: qty 10
  Filled 2023-10-23 (×5): qty 16
  Filled 2023-10-23: qty 10
  Filled 2023-10-23: qty 16
  Filled 2023-10-23: qty 10
  Filled 2023-10-23 (×2): qty 16
  Filled 2023-10-23 (×2): qty 10
  Filled 2023-10-23 (×3): qty 16

## 2023-10-23 MED ORDER — IPRATROPIUM-ALBUTEROL 0.5-2.5 (3) MG/3ML IN SOLN: 3.0000 mL | Freq: Four times a day (QID) | RESPIRATORY_TRACT | Status: DC

## 2023-10-23 MED ORDER — SODIUM BICARBONATE 650 MG PO TABS
1300.0000 mg | ORAL_TABLET | Freq: Two times a day (BID) | ORAL | Status: DC
  Administered 2023-10-23 – 2023-11-02 (×26): 1300 mg via ORAL
  Filled 2023-10-23 (×20): qty 2

## 2023-10-23 MED ORDER — POLYETHYLENE GLYCOL 3350 17 G PO PACK
17.0000 g | PACK | Freq: Every day | ORAL | Status: DC
  Administered 2023-10-24: 17 g via ORAL
  Filled 2023-10-23: qty 1

## 2023-10-23 MED ORDER — ATORVASTATIN CALCIUM 10 MG PO TABS
10.0000 mg | ORAL_TABLET | Freq: Every day | ORAL | Status: DC
  Administered 2023-10-23 – 2023-11-02 (×14): 10 mg via ORAL
  Filled 2023-10-23 (×11): qty 1

## 2023-10-23 MED ORDER — DARBEPOETIN ALFA 100 MCG/0.5ML IJ SOSY
100.0000 ug | PREFILLED_SYRINGE | Freq: Once | INTRAMUSCULAR | Status: DC
  Filled 2023-10-23: qty 0.5

## 2023-10-23 MED ORDER — PANTOPRAZOLE SODIUM 40 MG IV SOLR
40.0000 mg | Freq: Two times a day (BID) | INTRAVENOUS | Status: DC
  Administered 2023-10-23 – 2023-10-26 (×6): 40 mg via INTRAVENOUS
  Filled 2023-10-23 (×6): qty 10

## 2023-10-23 MED ORDER — HEPARIN SODIUM (PORCINE) 1000 UNIT/ML DIALYSIS: 1000.0000 [IU] | INTRAMUSCULAR | Status: DC | PRN

## 2023-10-23 MED ORDER — ALBUTEROL SULFATE (2.5 MG/3ML) 0.083% IN NEBU: 2.5000 mg | INHALATION_SOLUTION | RESPIRATORY_TRACT | Status: DC | PRN

## 2023-10-23 MED ORDER — SODIUM CHLORIDE 0.9% FLUSH
3.0000 mL | Freq: Two times a day (BID) | INTRAVENOUS | Status: DC
  Administered 2023-10-23 – 2023-11-02 (×22): 3 mL via INTRAVENOUS

## 2023-10-23 MED ORDER — ANTICOAGULANT SODIUM CITRATE 4% (200MG/5ML) IV SOLN: 5.0000 mL | Status: DC | PRN

## 2023-10-23 MED ORDER — LIDOCAINE HCL (PF) 1 % IJ SOLN: 5.0000 mL | INTRAMUSCULAR | Status: DC | PRN

## 2023-10-23 MED ORDER — HYDROMORPHONE HCL 1 MG/ML IJ SOLN
0.5000 mg | Freq: Once | INTRAMUSCULAR | Status: AC
  Administered 2023-10-23: 0.5 mg via INTRAVENOUS
  Filled 2023-10-23: qty 1

## 2023-10-23 MED ORDER — SODIUM CHLORIDE 0.9% IV SOLUTION: Freq: Once | INTRAVENOUS | Status: AC

## 2023-10-23 MED ORDER — NICOTINE POLACRILEX 2 MG MT GUM: 2.0000 mg | CHEWING_GUM | OROMUCOSAL | Status: DC | PRN

## 2023-10-23 MED ORDER — GABAPENTIN 100 MG PO CAPS
100.0000 mg | ORAL_CAPSULE | Freq: Three times a day (TID) | ORAL | Status: DC
  Administered 2023-10-23 – 2023-11-02 (×38): 100 mg via ORAL
  Filled 2023-10-23 (×29): qty 1

## 2023-10-23 MED ORDER — FUROSEMIDE 10 MG/ML IJ SOLN
160.0000 mg | Freq: Once | INTRAVENOUS | Status: AC
  Administered 2023-10-23: 160 mg via INTRAVENOUS
  Filled 2023-10-23: qty 10

## 2023-10-23 MED ORDER — OXYCODONE HCL 5 MG PO TABS
5.0000 mg | ORAL_TABLET | ORAL | Status: AC | PRN
  Administered 2023-10-23 – 2023-10-25 (×6): 5 mg via ORAL
  Filled 2023-10-23 (×6): qty 1

## 2023-10-23 NOTE — ED Notes (Addendum)
 Awaiting lasix  infusion and breztri  from main pharmacy

## 2023-10-23 NOTE — ED Notes (Signed)
 Attempted to call [facility] Placed on hold for over 2 mins with no answer

## 2023-10-23 NOTE — Progress Notes (Addendum)
 PROGRESS NOTE    [facility]  FMW:[id number] DOB: 11-26-1960 DOA: 10/22/2023 PCP: Pcp, No   Brief Narrative:  HPI:  [name] is a 63 y.o. 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 She returns with respiratory distress brought in by EMS.    She reports progressive worsening dyspnea which she relates to fluid overload. She has a chronic cough which is nonproductive no recent fevers. Has been out of medications over past few weeks. She does not live near the hospital and so getting here for dialysis is a struggle, needing to hitch rides etc. She is upset about her illness progressing and she says she's more open to social work resources. Still smoking 1 PPD cigarettes and she snorts cocaine which she acknowledges ongoing use, no crack use.   Assessment & Plan:   Principal Problem:   Volume overload Active Problems:   Acute on chronic anemia   CHF exacerbation (HCC)   ESRD on dialysis (HCC)   Angiodysplasia of upper gastrointestinal tract  Acute hypoxic respiratory failure secondary to volume overload/acute on chronic congestive heart failure with preserved ejection fraction, persistent bilateral pleural effusion and anemia/ESRD on HD, POA: Patient was placed on BiPAP upon arrival due to hypoxia.  Gradually she improved and currently she is on room air.  She denies any shortness of breath but continues to complain of aches and pains all over the body.  Patient has ESRD and she is supposed to be on HD x 3 but she does not have any outpatient clinic due to being homeless.  Patient comes to the ED whenever she feels  like she needs dialysis.  Last time she had dialysis was on 10/05/2023.  She showed up again with the symptoms as mentioned above.  Has slightly elevated BNP than her baseline.  She still produces urine, she remains on Lasix .  Nephrology is consulted, she is going to have dialysis today.  Acute on chronic anemia/acute blood loss anemia/symptomatic anemia/history of AVM: Patient has a history of AVM, hemoglobin 2 weeks ago was 9.4, she presented with a 5.3.  Received 2 units of PRBC transfusion, repeat posttransfusion hemoglobin is still pending.  This is also one of the cause of her dyspnea.  Due to to history of GI bleed due to AVM, GI is now consulted.  Continue Protonix  twice daily, monitor H&H every 12 hours.  ACS ruled out/demand ischemia ruled in: Troponins were checked which were in 20s and flat indicative of demand ischemia.  No chest pain.  Hyponatremia, mild and chronic, likely from hypervolemia.   Qtc prolongation - 511 -- Avoid QT prolonging meds    Social determinants: Homelessness, TOC consulted.   COPD: Without exacerbation. Nebs prn, continue home Breztri    Hypertension: Blood pressure controlled, hold home amlodipine  but resume Coreg .  HLD: Continue home Atorvastatin    Polysubstance / cocaine use: Check UDS  Smoking: Nicotine  patch, gum prn; I have discussed tobacco cessation with the patient.  I have counseled the patient regarding the negative impacts of continued tobacco use including but not limited to lung cancer, COPD, and cardiovascular disease.  I  have discussed alternatives to tobacco and modalities that may help facilitate tobacco cessation including but not limited to biofeedback, hypnosis, and medications.  Total time spent with tobacco counseling was 5 minutes.  Mood d/o: Continue home Buspar .   Chronic pain: Continue home Gabapentin , she is crying in pain, starting on low-dose oxycodone  for 1 day.  DVT prophylaxis: SCDs Start: 10/23/23 9351   Code Status:  Full Code  Family Communication:  None present at bedside.   Status is: Inpatient Remains inpatient appropriate because: Needs dialysis and management of other medical issues as mentioned above.   Estimated body mass index is 21.93 kg/m as calculated from the following:   Height as of this encounter: 5' 7 (1.702 m).   Weight as of this encounter: 63.5 kg.    Nutritional Assessment: Body mass index is 21.93 kg/m.SABRA Seen by dietician.  I agree with the assessment and plan as outlined below: Nutrition Status:        . Skin Assessment: I have examined the patient's skin and I agree with the wound assessment as performed by the wound care RN as outlined below:    Consultants:  Nephrology and GI  Procedures:  As above  Antimicrobials:  Anti-infectives (From admission, onward)    None         Subjective: Patient seen and examined, patient's primary nurse was at the bedside.  Patient continued to yell and cry stating that she was hurting.  It was very hard to understand patient's language due to her crying and talking.  She denied any chest pain or shortness of breath.  Objective: Vitals:   10/23/23 0645 10/23/23 0700 10/23/23 0714 10/23/23 0726  BP: (!) 148/74 (!) 147/72  (!) 146/74  Pulse: 93 90  93  Resp: 18 16  20   Temp: 98 F (36.7 C)  98.5 F (36.9 C) 97.9 F (36.6 C)  TempSrc:   Oral Oral  SpO2: 100% 97% 100% 98%  Weight:      Height:        Intake/Output Summary (Last 24 hours) at 10/23/2023 1030 Last data filed at 10/23/2023 0841 Gross per 24 hour  Intake 1000.25 ml  Output --  Net 1000.25 ml   Filed Weights   10/22/23 2335  Weight: 63.5 kg    Examination:  General exam: Appears in pain. Respiratory system: Clear to auscultation. Respiratory effort normal. Cardiovascular system: S1 & S2 heard, RRR. No JVD, murmurs, rubs, gallops or clicks. No pedal edema. Gastrointestinal system: Abdomen is nondistended, soft and nontender. No organomegaly or  masses felt. Normal bowel sounds heard. Central nervous system: Alert and oriented. No focal neurological deficits. Extremities: Symmetric 5 x 5 power. Skin: No rashes, lesions or ulcers  Data Reviewed: I have personally reviewed following labs and imaging studies  CBC: Recent Labs  Lab 10/22/23 2351 10/23/23 0001  WBC 6.2  --   NEUTROABS 4.1  --   HGB 5.3* 5.1*  HCT 17.1* 15.0*  MCV 97.2  --   PLT 293  --    Basic Metabolic Panel: Recent Labs  Lab 10/22/23 2351 10/23/23 0001  NA 130* 131*  K 4.2 4.4  CL 100  --   CO2 9*  --   GLUCOSE 84  --   BUN 92*  --   CREATININE 5.59*  --   CALCIUM  8.1*  --    GFR: Estimated Creatinine Clearance: 10 mL/min (A) (by C-G formula based on SCr of 5.59 mg/dL (H)). Liver Function Tests: Recent  Labs  Lab 10/22/23 2351  AST 18  ALT 12  ALKPHOS 83  BILITOT 0.6  PROT 6.7  ALBUMIN  3.0*   No results for input(s): LIPASE, AMYLASE in the last 168 hours. No results for input(s): AMMONIA in the last 168 hours. Coagulation Profile: No results for input(s): INR, PROTIME in the last 168 hours. Cardiac Enzymes: No results for input(s): CKTOTAL, CKMB, CKMBINDEX, TROPONINI in the last 168 hours. BNP (last 3 results) No results for input(s): PROBNP in the last 8760 hours. HbA1C: No results for input(s): HGBA1C in the last 72 hours. CBG: No results for input(s): GLUCAP in the last 168 hours. Lipid Profile: No results for input(s): CHOL, HDL, LDLCALC, TRIG, CHOLHDL, LDLDIRECT in the last 72 hours. Thyroid Function Tests: No results for input(s): TSH, T4TOTAL, FREET4, T3FREE, THYROIDAB in the last 72 hours. Anemia Panel: No results for input(s): VITAMINB12, FOLATE, FERRITIN, TIBC, IRON , RETICCTPCT in the last 72 hours. Sepsis Labs: Recent Labs  Lab 10/23/23 0859  LATICACIDVEN 0.8    Recent Results (from the past 240 hours)  Resp panel by RT-PCR (RSV, Flu A&B, Covid) Anterior  Nasal Swab     Status: None   Collection Time: 10/22/23 11:51 PM   Specimen: Anterior Nasal Swab  Result Value Ref Range Status   SARS Coronavirus 2 by RT PCR NEGATIVE NEGATIVE Final   Influenza A by PCR NEGATIVE NEGATIVE Final   Influenza B by PCR NEGATIVE NEGATIVE Final    Comment: (NOTE) The Xpert Xpress SARS-CoV-2/FLU/RSV plus assay is intended as an aid in the diagnosis of influenza from Nasopharyngeal swab specimens and should not be used as a sole basis for treatment. Nasal washings and aspirates are unacceptable for Xpert Xpress SARS-CoV-2/FLU/RSV testing.  Fact Sheet for Patients: BloggerCourse.com  Fact Sheet for Healthcare Providers: SeriousBroker.it  This test is not yet approved or cleared by the [place]  FDA and has been authorized for detection and/or diagnosis of SARS-CoV-2 by FDA under an Emergency Use Authorization (EUA). This EUA will remain in effect (meaning this test can be used) for the duration of the COVID-19 declaration under Section 564(b)(1) of the Act, 21 U.S.C. section 360bbb-3(b)(1), unless the authorization is terminated or revoked.     Resp Syncytial Virus by PCR NEGATIVE NEGATIVE Final    Comment: (NOTE) Fact Sheet for Patients: BloggerCourse.com  Fact Sheet for Healthcare Providers: SeriousBroker.it  This test is not yet approved or cleared by the [place]  FDA and has been authorized for detection and/or diagnosis of SARS-CoV-2 by FDA under an Emergency Use Authorization (EUA). This EUA will remain in effect (meaning this test can be used) for the duration of the COVID-19 declaration under Section 564(b)(1) of the Act, 21 U.S.C. section 360bbb-3(b)(1), unless the authorization is terminated or revoked.  Performed at [facility], [address], [place], [place] [address]      Radiology Studies: CT Angio Chest PE W  and/or Wo Contrast Result Date: 10/23/2023 CLINICAL DATA:  Homeless patient with end-stage renal failure, missed last 3 weeks of dialysis. Presents in respiratory distress. Concern for pulmonary embolism. EXAM: CT ANGIOGRAPHY CHEST WITH CONTRAST TECHNIQUE: Multidetector CT imaging of the chest was performed using the standard protocol during bolus administration of intravenous contrast. Multiplanar CT image reconstructions and MIPs were obtained to evaluate the vascular anatomy. RADIATION DOSE REDUCTION: This exam was performed according to the departmental dose-optimization program which includes automated exposure control, adjustment of the mA and/or kV according to patient size and/or use of iterative  reconstruction technique. CONTRAST:  75mL OMNIPAQUE  IOHEXOL  350 MG/ML SOLN COMPARISON:  Chest CT without contrast 11/15/2021, abdomen and pelvis CT without contrast 07/23/2023. FINDINGS: Cardiovascular: There is panchamber moderate cardiomegaly, increased from 07/23/2023, with left chamber predominance and venous distension. There is no substantial pericardial effusion. There is patchy three-vessel coronary artery calcification. The pulmonary arteries are upper limit of normal in caliber. Respiratory motion and pleural effusions obscure the subsegmental arterial bed in the lower lobes but no arterial embolism is seen elsewhere. There is mild scattered calcific plaque in the aorta and great vessels without aneurysm, dissection or stenosis. Mediastinum/Nodes: There is a single prominent right mid hilar lymph node, 1 cm in short axis on 5:75. There are prominent lower right paratracheal and subcarinal lymph nodes both measuring 1.3 cm in short axis. There is no bulky, encasing or further adenopathy including the axillary and supraclavicular spaces. Negative thyroid gland, thoracic trachea and thoracic esophagus. Lungs/Pleura: There is mild interstitial edema in the lung apices and bases. There R increased  moderate-sized left and small right layering pleural effusions. Some of the right pleural fluid could be loculated laterally and anteriorly in the chest base. There is a mucoid septation in the distal right main bronchus and right lower lobe bronchus. In the lower lobes there is compressive atelectasis or consolidation alongside both pleural effusions. There is increased consolidation versus atelectasis in the lingular base, including compared with 07/23/2023. There is scattered linear scarring or atelectasis in the lower lung fields. There is diffuse bronchial thickening. No bronchial impaction is seen. Rest of the lungs appear clear. Upper Abdomen: Upper abdominal ascites, mild amount surrounding the liver and spleen and partially filling the lesser sac. There is aortic and branch vessel atherosclerosis. No free air. Musculoskeletal: Increased body wall anasarca over both prior studies. There is degenerative change and mild kyphosis thoracic spine. Dense bones consistent with renal osteodystrophy. No acute or other significant osseous findings. No mass in the visualized chest wall is seen. Review of the MIP images confirms the above findings. IMPRESSION: 1. No evidence of arterial embolus. 2. Cardiomegaly with left chamber predominance, venous distension, and mild interstitial edema. Findings consistent with CHF or fluid overload. 3. Increased moderate-sized left and small right layering pleural effusions. Some of the right pleural fluid could be loculated laterally and anteriorly in the chest base. 4. Compressive atelectasis or consolidation in the adjacent lower lobes. 5. Increased consolidation versus atelectasis in the lingular base. 6. Diffuse bronchial thickening. 7. Mucoid septation in the distal right main bronchus and right lower lobe bronchus. 8. Upper abdominal ascites. 9. Increased body wall anasarca. 10. Renal osteodystrophy. 11. Aortic and coronary artery atherosclerosis. Aortic Atherosclerosis  (ICD10-I70.0). Electronically Signed   By: Francis Quam M.D.   On: 10/23/2023 05:03   DG Chest Port 1 View Result Date: 10/23/2023 CLINICAL DATA:  Resp distress EXAM: PORTABLE CHEST 1 VIEW COMPARISON:  Chest x-ray 10/05/2023 FINDINGS: Patient is rotated. The heart and mediastinal contours are unchanged. Bibasilar airspace opacities. Chronic coarsened interstitial markings with no overt pulmonary edema. Persistent bilateral pleural effusions trace to small volume pleural effusions. No pneumothorax. No acute osseous abnormality. IMPRESSION: Persistent bilateral pleural effusions trace to small volume pleural effusions. Electronically Signed   By: Morgane  Naveau M.D.   On: 10/23/2023 00:13    Scheduled Meds:  atorvastatin   10 mg Oral Daily   budesonide -glycopyrrolate -formoterol   2 puff Inhalation BID   busPIRone   10 mg Oral TID   Chlorhexidine  Gluconate Cloth  6 each Topical Q0600  ferrous sulfate   325 mg Oral Q breakfast   gabapentin   100 mg Oral TID   nicotine   21 mg Transdermal Daily   pantoprazole  (PROTONIX ) IV  40 mg Intravenous BID   sodium bicarbonate   1,300 mg Oral BID   sodium chloride  flush  3 mL Intravenous Q12H   Continuous Infusions:  anticoagulant sodium citrate      furosemide  160 mg (10/23/23 1000)   furosemide        LOS: 0 days   Fredia Skeeter, MD [facility]  10/23/2023, 10:30 AM  Total time spent: 45 minutes. *Please note that this is a verbal dictation therefore any spelling or grammatical errors are due to the Dragon Medical One system interpretation.  Please page via Amion and do not message via secure chat for urgent patient care matters. Secure chat can be used for non urgent patient care matters.  How to contact the [facility] Attending or Consulting provider 7A - 7P or covering provider during after hours 7P -7A, for this patient?  Check the care team in [facility] and look for a) attending/consulting [facility] provider listed and b) the [facility] team listed. Page or secure  chat 7A-7P. Log into [email or web address] and use [place] [facility]'s universal password to access. If you do not have the password, please contact the hospital operator. Locate the [facility] provider you are looking for under [facility] and page to a number that you can be directly reached. If you still have difficulty reaching the provider, please page the [facility] (Director on Call) for the Hospitalists listed on amion for assistance.
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
## 2023-10-23 NOTE — Progress Notes (Signed)
 Received message from charge nurse that patient was having bradycardia in the rates of 30s.  This did not last Peddie and she was back in 80s.  She did have another episode of bradycardia right after that.  She was then complaining of chest pain.  I ordered stat EKG.  Rapid response was called.  I saw patient at the bedside.  Patient was complaining of pain all over the body from head to toe.  There was no specific complaint of chest pain.  Patient's RN was at the bedside during my second visit.  In fact patient looked better than how she looked this morning.  I think her anxiety and chronic pain syndrome is playing a role.  Patient had elevated D-dimer but troponins were flat indicative of demand ischemia.  However now that she complained of chest pain, have ordered CT chest PE study to rule out PE.  I have specifically asked the nurse to get this done as soon as possible before she goes for dialysis.  For anxiety, I am going to prescribe her Xanax .  I spent another 35 minutes on the care of this patient.

## 2023-10-23 NOTE — Progress Notes (Signed)
 Heart Failure Navigator Progress Note  Assessed for Heart & Vascular TOC clinic readiness.  Patient does not meet criteria due to ESRD on Hemodialysis. No HF TOC. .   Navigator will sign off at this time.   Stephane Haddock, BSN, Scientist, clinical (histocompatibility and immunogenetics) Only

## 2023-10-23 NOTE — Progress Notes (Signed)
 Received patient in bed to unit.  Alert and oriented, x 4 slightly agitated, uneasy, complaining of pain on her chest and back Informed consent signed and in chart. TX duration: 2.5 hrs   Patient able to reach 2700 ml of UF d/t amdominal cramps Transported back to the room Alert, without acute distress.  Hand-off given to patient's nurse.   Access used: Right AVF  Access issues: none  Total UF removed: 2700  Medication(s) given: oxycodone  5 mg PO  Post HD VS: see table  Post HD weight: uta d/t bed    10/23/23 2250  Vitals  Temp 97.7 F (36.5 C)  Temp Source Oral  BP (!) 160/68  MAP (mmHg) 91  BP Location Left Arm  BP Method Automatic  Patient Position (if appropriate) Lying  Pulse Rate 96  Pulse Rate Source Monitor  ECG Heart Rate 91  Resp 18  Oxygen  Therapy  SpO2 100 %  O2 Device Nasal Cannula  O2 Flow Rate (L/min) 2 L/min  Patient Activity (if Appropriate) In bed  Pulse Oximetry Type Continuous  During Treatment Monitoring  Blood Flow Rate (mL/min) 0 mL/min  Arterial Pressure (mmHg) 30.5 mmHg  Venous Pressure (mmHg) -499.98 mmHg  TMP (mmHg) 24.64 mmHg  Ultrafiltration Rate (mL/min) 0 mL/min  Dialysate Flow Rate (mL/min) 300 ml/min  Dialysate Potassium Concentration 3  Dialysate Calcium  Concentration 2.5  Duration of HD Treatment -hour(s) 2.5 hour(s)  Cumulative Fluid Removed (mL) per Treatment  2706.99  HD Safety Checks Performed Yes  Intra-Hemodialysis Comments Tx completed  Post Treatment  Dialyzer Clearance Lightly streaked  Liters Processed 36.7  Fluid Removed (mL) 2700 mL  Tolerated HD Treatment Yes (13 mins remaining time pt complained of cramps on her abdomen, goal down to )  Note  Patient Observations  (patient axo x4,)  Fistula / Graft Right Upper arm Arteriovenous fistula  Placement Date/Time: 08/14/22 1230   Orientation: Right  Access Location: (c) Upper arm  Access Type: Arteriovenous fistula  Site Condition No complications  Fistula  / Graft Assessment Present;Thrill;Bruit  Status Deaccessed  Drainage Description None        Mercer CHARLENA Montgomery, BSN, RN [facility]

## 2023-10-23 NOTE — ED Notes (Addendum)
 Critical VBG results shown to Dr. Trine. Pending crossover through Epic.

## 2023-10-23 NOTE — Progress Notes (Signed)
 Patient brought to 4E from ED. VSS. Telemetry box applied, CCMD notified. Patient oriented to room and staff. Call bell in reach.  Kenard Gower, RN

## 2023-10-23 NOTE — Progress Notes (Signed)
  Echocardiogram 2D Echocardiogram has been performed.  Tinnie FORBES Gosling RDCS 10/23/2023, 2:57 PM

## 2023-10-23 NOTE — H&P (Addendum)
 History and Physical    [name] FMW:[id number] DOB: 06-09-1960 DOA: 10/22/2023  PCP: Pcp, No   Patient coming from: Street - homeless    Chief Complaint:  Chief Complaint  Patient presents with   Respiratory Distress    HPI:  [name] is a 63 y.o. 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 She returns with respiratory distress brought in by EMS.   She reports progressive worsening dyspnea which she relates to fluid overload. She has a chronic cough which is nonproductive no recent fevers. Has been out of medications over past few weeks. She does not live near the hospital and so getting here for dialysis is a struggle, needing to hitch rides etc. She is upset about her illness progressing and she says she's more open to social work resources. Still smoking 1 PPD cigarettes and she snorts cocaine which she acknowledges ongoing use, no crack use.   Review of Systems:  ROS complete and negative except as marked above   Allergies  Allergen Reactions   Zestril  [Lisinopril ] Other (See Comments)    Hyperkalemia    Prior to Admission medications   Medication Sig Start Date End Date Taking? Authorizing Provider  acetaminophen  (TYLENOL ) 325 MG tablet Take 2 tablets (650 mg total) by mouth every 6 (six) hours as needed for mild pain (pain score 1-3) (or Fever >/= 101). 07/01/23   Danton Reyes DASEN, MD  albuterol  (VENTOLIN  HFA) 108 (90 Base) MCG/ACT inhaler Inhale 2 puffs into the lungs every 6 (six) hours as needed for wheezing or shortness of breath. 07/26/23   Gherghe, Costin M, MD  amLODipine  (NORVASC ) 10  MG tablet Take 1 tablet (10 mg total) by mouth daily. 07/26/23   Gherghe, Costin M, MD  atorvastatin  (LIPITOR) 10 MG tablet Take 1 tablet (10 mg total) by mouth daily. 07/26/23   Gherghe, Costin M, MD  busPIRone  (BUSPAR ) 10 MG tablet Take 1 tablet (10 mg total) by mouth 3 (three) times daily. 07/26/23 11/14/23  Trixie Nilda CHRISTELLA, MD  calcitRIOL  (ROCALTROL ) 0.25 MCG capsule Take 1 capsule (0.25 mcg total) by mouth daily. 07/26/23   Gherghe, Costin M, MD  carvedilol  (COREG ) 6.25 MG tablet Take 1 tablet (6.25 mg total) by mouth 2 (two) times daily with a meal. 10/15/23   Vicci Barnie NOVAK, MD  ferrous sulfate  (FEROSUL) 325 (65 FE) MG tablet Take 1 tablet (325 mg total) by mouth daily with breakfast. 10/15/23   Vicci Barnie NOVAK, MD  gabapentin  (NEURONTIN ) 100 MG capsule Take 1 capsule (100 mg total) by mouth 3 (three) times daily. 10/15/23 01/13/24  Vicci Barnie NOVAK, MD  nicotine  (NICODERM CQ  - DOSED IN MG/24 HOURS) 21 mg/24hr patch Place 1 patch (21 mg total) onto the skin daily. 07/27/23   Gherghe, Costin M, MD  oxyCODONE  (OXY IR/ROXICODONE ) 5 MG immediate release tablet Take 1 tablet (5 mg total) by mouth every 4 (four) hours as needed for severe pain (pain score 7-10). 07/26/23   Gherghe, Costin M, MD  pantoprazole  (PROTONIX ) 40 MG tablet Take 1 tablet (40 mg total) by mouth 2 (two) times daily. 07/26/23  11/14/23  Gherghe, Costin M, MD  sertraline  (ZOLOFT ) 25 MG tablet Take 1 tablet (25 mg total) by mouth daily. 07/26/23   Gherghe, Costin M, MD  sodium bicarbonate  650 MG tablet Take 2 tablets (1,300 mg total) by mouth 2 (two) times daily. 10/15/23 01/13/24  Vicci Barnie NOVAK, MD  torsemide  (DEMADEX ) 100 MG tablet Take 1 tablet (100 mg total) by mouth daily. 10/15/23   Vicci Barnie NOVAK, MD  traZODone  (DESYREL ) 50 MG tablet Take 1 tablet (50 mg total) by mouth at bedtime as needed for sleep. 07/26/23   Trixie Nilda HERO, MD  TRELEGY ELLIPTA  100-62.5-25 MCG/ACT AEPB Inhale 1 puff into the lungs daily. 07/26/23    Trixie Nilda HERO, MD    Past Medical History:  Diagnosis Date   Acute on chronic blood loss anemia 08/01/2022   Acute on chronic diastolic CHF (congestive heart failure) (HCC) 08/12/2022   Acute on chronic respiratory failure with hypoxia (HCC) 05/16/2023   Acute pulmonary edema (HCC) 05/16/2023   Acute seasonal allergic rhinitis due to pollen 02/22/2016   Alcohol  abuse    Allergy    Anxiety    Arteriovenous fistula infection, initial encounter (HCC) 04/16/2023   Arthritis    Asthma    Cannabis abuse    Cocaine abuse (HCC)    COPD (chronic obstructive pulmonary disease) (HCC)    COPD with acute exacerbation (HCC) 10/25/2022   COVID 05/16/2023   Depression    Drug addiction (HCC)    GERD (gastroesophageal reflux disease)    GIB (gastrointestinal bleeding) 08/19/2019   Heart murmur    Heme positive stool 08/14/2022   Homelessness    Hx of adenomatous colonic polyps    Hyperlipidemia    Hypertension    pt stated every once in a while BP will be high but has not been prescribed medication for HTN.    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) 02/22/2016   SIRS (systemic inflammatory response syndrome) (HCC) 10/25/2022    Past Surgical History:  Procedure Laterality Date   A/V FISTULAGRAM Right 01/31/2023   Procedure: A/V Fistulagram;  Surgeon: Gretta Lonni PARAS, MD;  Location: [facility];  Service: Cardiovascular;  Laterality: Right;   AV FISTULA PLACEMENT Right 08/14/2022   Procedure: RIGHT ARM BRACHIOBASILIC ATERIOVENOUS FISTULA CREATION;  Surgeon: Magda Debby SAILOR, MD;  Location: [facility] OR;  Service: Vascular;  Laterality: Right;   BASCILIC VEIN TRANSPOSITION Right 11/13/2022   Procedure: RIGHT ARM  SECOND STAGE BASILIC VEIN TRANSPOSITION;  Surgeon: Magda Debby SAILOR, MD;  Location: [facility] OR;  Service: Vascular;  Laterality: Right;   BIOPSY  01/01/2019   Procedure: BIOPSY;  Surgeon: Albertus Gordy HERO, MD;  Location: [facility] ENDOSCOPY;  Service: Endoscopy;;   BIOPSY  11/17/2021   Procedure: BIOPSY;  Surgeon: Charlanne Groom, MD;  Location: [facility] ENDOSCOPY;  Service: Gastroenterology;;   CESAREAN SECTION  1989   COLONOSCOPY  11/07/2020   2018   CYSTOSCOPY W/ URETERAL STENT PLACEMENT Left 08/01/2018   Procedure: CYSTOSCOPY WITH RETROGRADE PYELOGRAM/URETERAL STENT PLACEMENT;  Surgeon: Carolee Sherwood JONETTA DOUGLAS, MD;  Location: [facility] ORS;  Service: Urology;  Laterality: Left;   CYSTOSCOPY WITH RETROGRADE PYELOGRAM, URETEROSCOPY AND STENT PLACEMENT Left 01/15/2019   Procedure: CYSTOSCOPY WITH  RETROGRADE PYELOGRAM, URETEROSCOPY AND STENT PLACEMENT;  Surgeon: Carolee Sherwood JONETTA DOUGLAS, MD;  Location: [facility] ORS;  Service: Urology;  Laterality: Left;   ENTEROSCOPY N/A 08/16/2022   Procedure: ENTEROSCOPY;  Surgeon: Wilhelmenia Aloha Raddle., MD;  Location: [facility] ENDOSCOPY;  Service: Gastroenterology;  Laterality: N/A;   ENTEROSCOPY N/A 10/31/2022   Procedure: ENTEROSCOPY;  Surgeon: Albertus Gordy HERO, MD;  Location[facility] ENDOSCOPY;  Service: Gastroenterology;  Laterality: N/A;   ENTEROSCOPY N/A 05/09/2023   Procedure: ENTEROSCOPY;  Surgeon: Federico Rosario BROCKS, MD;  Location: [facility] ENDOSCOPY;  Service: Gastroenterology;  Laterality: N/A;   ENTEROSCOPY N/A 09/09/2023   Procedure: ENTEROSCOPY;  Surgeon: Federico Rosario BROCKS, MD;  Location: [facility] ENDOSCOPY;  Service: Gastroenterology;  Laterality: N/A;   ESOPHAGOGASTRODUODENOSCOPY (EGD) WITH PROPOFOL  N/A 01/01/2019   Procedure: ESOPHAGOGASTRODUODENOSCOPY (EGD) WITH PROPOFOL ;  Surgeon: Albertus Gordy HERO, MD;  Location: [facility] ENDOSCOPY;  Service: Endoscopy;  Laterality: N/A;   ESOPHAGOGASTRODUODENOSCOPY (EGD) WITH PROPOFOL  N/A 08/21/2019   Procedure: ESOPHAGOGASTRODUODENOSCOPY (EGD) WITH PROPOFOL ;  Surgeon: Shila Gustav GAILS, MD;   Location: [facility] ENDOSCOPY;  Service: Endoscopy;  Laterality: N/A;   ESOPHAGOGASTRODUODENOSCOPY (EGD) WITH PROPOFOL  N/A 11/17/2021   Procedure: ESOPHAGOGASTRODUODENOSCOPY (EGD) WITH PROPOFOL ;  Surgeon: Charlanne Groom, MD;  Location: [facility] ENDOSCOPY;  Service: Gastroenterology;  Laterality: N/A;   FRACTURE SURGERY Left 2011   arm   HEMOSTASIS CLIP PLACEMENT  08/16/2022   Procedure: HEMOSTASIS CLIP PLACEMENT;  Surgeon: Wilhelmenia Aloha Raddle., MD;  Location: [facility] ENDOSCOPY;  Service: Gastroenterology;;   HOT HEMOSTASIS N/A 08/16/2022   Procedure: HOT HEMOSTASIS (ARGON PLASMA COAGULATION/BICAP);  Surgeon: Wilhelmenia Aloha Raddle., MD;  Location: [facility] ENDOSCOPY;  Service: Gastroenterology;  Laterality: N/A;   HOT HEMOSTASIS N/A 10/31/2022   Procedure: HOT HEMOSTASIS (ARGON PLASMA COAGULATION/BICAP);  Surgeon: Albertus Gordy HERO, MD;  Location: [facility] ENDOSCOPY;  Service: Gastroenterology;  Laterality: N/A;   HOT HEMOSTASIS N/A 05/09/2023   Procedure: HOT HEMOSTASIS (ARGON PLASMA COAGULATION/BICAP);  Surgeon: Federico Rosario BROCKS, MD;  Location: [facility] ENDOSCOPY;  Service: Gastroenterology;  Laterality: N/A;   HOT HEMOSTASIS N/A 09/09/2023   Procedure: EGD, WITH ARGON PLASMA COAGULATION;  Surgeon: Federico Rosario BROCKS, MD;  Location: [facility] ENDOSCOPY;  Service: Gastroenterology;  Laterality: N/A;   INSERTION OF DIALYSIS CATHETER Right 08/14/2022   Procedure: INSERTION OF TUNNELED DIALYSIS CATHETER USING PALINDROME CATHETER KIT 19CM;  Surgeon: Magda Debby SAILOR, MD;  Location: [facility] OR;  Service: Vascular;  Laterality: Right;   IR THORACENTESIS ASP PLEURAL SPACE W/IMG GUIDE  09/15/2023   POLYPECTOMY  08/16/2022   Procedure: POLYPECTOMY;  Surgeon: Wilhelmenia Aloha Raddle., MD;  Location: [facility] ENDOSCOPY;  Service: Gastroenterology;;   SUBMUCOSAL TATTOO INJECTION  08/16/2022   Procedure: SUBMUCOSAL TATTOO INJECTION;  Surgeon: Wilhelmenia Aloha Raddle., MD;  Location: [facility] ENDOSCOPY;  Service: Gastroenterology;;   UPPER GASTROINTESTINAL ENDOSCOPY       reports  that she has been smoking cigarettes. She has been exposed to tobacco smoke. She has never used smokeless tobacco. She reports that she does not currently use alcohol . She reports current drug use. Drug: Marijuana.  Family History  Problem Relation Age of Onset   Diabetes Father    Colon cancer Neg Hx    Esophageal cancer Neg Hx    Rectal cancer Neg Hx    Stomach cancer Neg Hx      Physical Exam: Vitals:   10/23/23 0315 10/23/23 0411 10/23/23 0432 10/23/23 0447  BP: (!) 142/78 138/76 (!) 141/74 (!) 142/76  Pulse: 89 94 89 90  Resp: 11 16 16 16   Temp:  97.9 F (  36.6 C) 97.7 F (36.5 C) 97.9 F (36.6 C)  TempSrc:  Axillary Axillary Temporal  SpO2: 100% 100%  100%  Weight:      Height:        Gen: Awake, alert, chronically ill appearing, malnourished  CV: Regular, normal S1, Split S2,  Resp: Increased WOB, limited air movement, frequent nonproductive cough  Abd: Round, normoactive, nontender MSK: Symmetric, trace edema  Skin: No rashes or lesions to exposed skin  Neuro: Alert and interactive  Psych: affect is sad and tearful, appropriate    Data review:   Labs reviewed, notable for:   ABG 7.31/18, bicarb 9, AG 21 NA 130, K4.2, BUN 92 Cr 5.59  Hemoglobin 5.3, normocytic D-dimer 5.2  Micro:  Results for orders placed or performed during the hospital encounter of 10/05/23  Resp panel by RT-PCR (RSV, Flu A&B, Covid) Anterior Nasal Swab     Status: None   Collection Time: 10/05/23 12:45 AM   Specimen: Anterior Nasal Swab  Result Value Ref Range Status   SARS Coronavirus 2 by RT PCR NEGATIVE NEGATIVE Final   Influenza A by PCR NEGATIVE NEGATIVE Final   Influenza B by PCR NEGATIVE NEGATIVE Final    Comment: (NOTE) The Xpert Xpress SARS-CoV-2/FLU/RSV plus assay is intended as an aid in the diagnosis of influenza from Nasopharyngeal swab specimens and should not be used as a sole basis for treatment. Nasal washings and aspirates are unacceptable for Xpert Xpress  SARS-CoV-2/FLU/RSV testing.  Fact Sheet for Patients: BloggerCourse.com  Fact Sheet for Healthcare Providers: SeriousBroker.it  This test is not yet approved or cleared by the [place]  FDA and has been authorized for detection and/or diagnosis of SARS-CoV-2 by FDA under an Emergency Use Authorization (EUA). This EUA will remain in effect (meaning this test can be used) for the duration of the COVID-19 declaration under Section 564(b)(1) of the Act, 21 U.S.C. section 360bbb-3(b)(1), unless the authorization is terminated or revoked.     Resp Syncytial Virus by PCR NEGATIVE NEGATIVE Final    Comment: (NOTE) Fact Sheet for Patients: BloggerCourse.com  Fact Sheet for Healthcare Providers: SeriousBroker.it  This test is not yet approved or cleared by the [place]  FDA and has been authorized for detection and/or diagnosis of SARS-CoV-2 by FDA under an Emergency Use Authorization (EUA). This EUA will remain in effect (meaning this test can be used) for the duration of the COVID-19 declaration under Section 564(b)(1) of the Act, 21 U.S.C. section 360bbb-3(b)(1), unless the authorization is terminated or revoked.  Performed at [facility], [address] [place], [place] [address]     Imaging reviewed:  CT Angio Chest PE W and/or Wo Contrast Result Date: 10/23/2023 CLINICAL DATA:  Homeless patient with end-stage renal failure, missed last 3 weeks of dialysis. Presents in respiratory distress. Concern for pulmonary embolism. EXAM: CT ANGIOGRAPHY CHEST WITH CONTRAST TECHNIQUE: Multidetector CT imaging of the chest was performed using the standard protocol during bolus administration of intravenous contrast. Multiplanar CT image reconstructions and MIPs were obtained to evaluate the vascular anatomy. RADIATION DOSE REDUCTION: This exam was performed according to the  departmental dose-optimization program which includes automated exposure control, adjustment of the mA and/or kV according to patient size and/or use of iterative reconstruction technique. CONTRAST:  75mL OMNIPAQUE  IOHEXOL  350 MG/ML SOLN COMPARISON:  Chest CT without contrast 11/15/2021, abdomen and pelvis CT without contrast 07/23/2023. FINDINGS: Cardiovascular: There is panchamber moderate cardiomegaly, increased from 07/23/2023, with left chamber predominance and venous distension. There is  no substantial pericardial effusion. There is patchy three-vessel coronary artery calcification. The pulmonary arteries are upper limit of normal in caliber. Respiratory motion and pleural effusions obscure the subsegmental arterial bed in the lower lobes but no arterial embolism is seen elsewhere. There is mild scattered calcific plaque in the aorta and great vessels without aneurysm, dissection or stenosis. Mediastinum/Nodes: There is a single prominent right mid hilar lymph node, 1 cm in short axis on 5:75. There are prominent lower right paratracheal and subcarinal lymph nodes both measuring 1.3 cm in short axis. There is no bulky, encasing or further adenopathy including the axillary and supraclavicular spaces. Negative thyroid gland, thoracic trachea and thoracic esophagus. Lungs/Pleura: There is mild interstitial edema in the lung apices and bases. There R increased moderate-sized left and small right layering pleural effusions. Some of the right pleural fluid could be loculated laterally and anteriorly in the chest base. There is a mucoid septation in the distal right main bronchus and right lower lobe bronchus. In the lower lobes there is compressive atelectasis or consolidation alongside both pleural effusions. There is increased consolidation versus atelectasis in the lingular base, including compared with 07/23/2023. There is scattered linear scarring or atelectasis in the lower lung fields. There is diffuse  bronchial thickening. No bronchial impaction is seen. Rest of the lungs appear clear. Upper Abdomen: Upper abdominal ascites, mild amount surrounding the liver and spleen and partially filling the lesser sac. There is aortic and branch vessel atherosclerosis. No free air. Musculoskeletal: Increased body wall anasarca over both prior studies. There is degenerative change and mild kyphosis thoracic spine. Dense bones consistent with renal osteodystrophy. No acute or other significant osseous findings. No mass in the visualized chest wall is seen. Review of the MIP images confirms the above findings. IMPRESSION: 1. No evidence of arterial embolus. 2. Cardiomegaly with left chamber predominance, venous distension, and mild interstitial edema. Findings consistent with CHF or fluid overload. 3. Increased moderate-sized left and small right layering pleural effusions. Some of the right pleural fluid could be loculated laterally and anteriorly in the chest base. 4. Compressive atelectasis or consolidation in the adjacent lower lobes. 5. Increased consolidation versus atelectasis in the lingular base. 6. Diffuse bronchial thickening. 7. Mucoid septation in the distal right main bronchus and right lower lobe bronchus. 8. Upper abdominal ascites. 9. Increased body wall anasarca. 10. Renal osteodystrophy. 11. Aortic and coronary artery atherosclerosis. Aortic Atherosclerosis (ICD10-I70.0). Electronically Signed   By: Francis Quam M.D.   On: 10/23/2023 05:03   DG Chest Port 1 View Result Date: 10/23/2023 CLINICAL DATA:  Resp distress EXAM: PORTABLE CHEST 1 VIEW COMPARISON:  Chest x-ray 10/05/2023 FINDINGS: Patient is rotated. The heart and mediastinal contours are unchanged. Bibasilar airspace opacities. Chronic coarsened interstitial markings with no overt pulmonary edema. Persistent bilateral pleural effusions trace to small volume pleural effusions. No pneumothorax. No acute osseous abnormality. IMPRESSION: Persistent  bilateral pleural effusions trace to small volume pleural effusions. Electronically Signed   By: Morgane  Naveau M.D.   On: 10/23/2023 00:13    EKG:  Personally reviewed, SR, without acute ischemic changes. Qtc 511   ED Course:  Placed on BiPAP initially for WOB -> transitioned to [place]. EDP consulted with Nephrology Dr. Macel who will arrange for HD. Treated with Methylpred, dilaudid , valium .   Assessment/Plan:  63 y.o. 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 She returns with respiratory distress secondary to fluid overload from missed dialysis   Volume overload  Hx ESRD without OP HD clinic Heart failure with preserved ejection fraction with acute exacerbation  Associated AGMA  Elevated BUN  -- EDP consulted with Nephrology Dr. Macel who will arrange for HD. -- give Lasix  160 mg IV x 1, tentatively ordered for BID  -- Check Lactate  -- resume home Sodium bicarb 1300 BID  -- Check echo   Respiratory failure, related to volume overload  Initially requiring BiPAP -> transitoned to [place]. CTA for PE negative for PE, demonstrates findings of fluid overload with mild interstitial edema, moderate R and small L pleural effusion; possibly with loculations, consolidation v atelectasis in adjacent LL and lingula, diffuse bronchial wall thickening, mucoid septation of R main, RLL bronchus.  -- Volume management per above  -- Check Flu/COVID/RSV  -- Nebs, IS, flutter valve   Chronic anemia,  Chronic blood loss from AVMs Hx gastritis  Hb 5.3 on admission. Recent iron  panel in 7/'25 Ferritin 244, TIBC 297, Sat 9%; B12 and  folate in the past have been normal. She had small bowel enteroscopy 09/09/23 with notable findings gastritis in antrum, and 4 nonbleeding angioectasia in the duodenum and 2 nonbleeding in the Jejunum treated with APC.  -- 2 U RBC ordered in the ED, repeat H/H ordered for noon  -- Routine GI consult in AM, contacted Dr. [facility]  -- Start on Pantoprazole  40 mg IV BID  -- no need to repeat nutritional testing, likely IDA superimposed on chronic disease., continue home iron   -- CLD for now    Chronic myocardial injury  Related to hypervolemia. EKG nonischemic. HS trop 23  -- Management directed at volume status per above  -- Repeat trop   Hyponatremia, mild  Likely from hypervolemia  -- Volume management via HD   Qtc prolongation - 511 -- Avoid QT prolonging meds   Social determinants  Homelessness  - TOC involvement   Chronic medical problems:  COPD: Without exacerbation. Nebs prn, continue home Breztri   Hypertension: Hold home amlodipine  and Coreg  for now, can resume as [place] as remains hypertensive after HD  HLD: Continue home Atorvastatin   Polysubstance / cocaine use: Check UDS or serum if anuric  Smoking: Nicotine  patch, gum prn; needs additional counseling on cessation   Mood d/o: Continue home Buspar .  Chronic pain: Continue home Gabapentin     Body mass index is 21.93 kg/m.    DVT prophylaxis: SCD  Code Status:  Full Code Diet:  Diet Orders (From admission, onward)    None      Family Communication:  None   Consults:  Nephrology, [facility]   Admission status:   Inpatient, Step Down Unit  Severity of Illness: The appropriate patient status for this patient is INPATIENT. Inpatient status is judged to be reasonable and necessary in order to provide the required intensity of service to ensure the patient's safety. The patient's presenting symptoms, physical exam findings, and initial radiographic and laboratory data in the context of their chronic  comorbidities is felt to place them at high risk for further clinical deterioration. Furthermore, it is not anticipated that the patient will be medically stable for discharge from the hospital within  2 midnights of admission.   * I certify that at the point of admission it is my clinical judgment that the patient will require inpatient hospital care spanning beyond 2 midnights from the point of admission due to high intensity of service, high risk for further deterioration and high frequency of surveillance required.*   Dorn [place], MD [facility]  How to contact the [facility] Attending or Consulting provider 7A - 7P or covering provider during after hours 7P -7A, for this patient.  Check the care team in [facility] and look for a) attending/consulting [facility] provider listed and b) the TR[facility] team listed Log into [email or web address] and use 's universal password to access. If you do not have the password, please contact the hospital operator. Locate the [facility] provider you are looking for under [facility] and page to a number that you can be directly reached. If you still have difficulty reaching the provider, please page the [facility] (Director on Call) for the Hospitalists listed on amion for assistance.  10/23/2023, 5:16 AM

## 2023-10-23 NOTE — Consult Note (Signed)
 [name] Admit Date: 10/22/2023 10/23/2023 [name] Requesting Physician:  Vernon MD  Reason for Consult:  ESRD, anemia HPI:  17F ESRD (comes to hospital for HD, no outpt unit), nonadherence, homelessness, hx/o GIB, hx/o substance abuse presented to ED overnight with progressive dyspnea, hypoxia, and worsened anemia.  Last HD > 2wk ago, 7/20.    Req BiPAP at first now weened to [place].  Hb 5.3 at presentation, rec 2u pRBC overnight, post Hb pending.  GI to see with hx/o AVM and GI Bleed.  K 4.2, BUN 92, SCr 5.6.  She has RUE AVF. States she wants to receive HD today.  BPs stable. Afebrile. CTA chest in ED with b/l effusions, pulm edema, ascites, diffuse body wall anasarca noted.   PMH as below  ROS Balance of 12 systems is negative w/ exceptions as above  PMH  Past Medical History:  Diagnosis Date   Acute on chronic blood loss anemia 08/01/2022   Acute on chronic diastolic CHF (congestive heart failure) (HCC) 08/12/2022   Acute on chronic respiratory failure with hypoxia (HCC) 05/16/2023   Acute pulmonary edema (HCC) 05/16/2023   Acute seasonal allergic rhinitis due to pollen 02/22/2016   Alcohol  abuse    Allergy    Anxiety    Arteriovenous fistula infection, initial encounter (HCC) 04/16/2023   Arthritis    Asthma    Cannabis abuse    Cocaine abuse (HCC)    COPD (chronic obstructive pulmonary disease) (HCC)    COPD with acute exacerbation (HCC) 10/25/2022   COVID 05/16/2023   Depression    Drug addiction (HCC)    GERD (gastroesophageal reflux disease)    GIB (gastrointestinal bleeding) 08/19/2019   Heart murmur    Heme positive stool 08/14/2022   Homelessness    Hx of adenomatous colonic polyps    Hyperlipidemia    Hypertension    pt stated every once in a while BP will be high but has not been prescribed medication for HTN.    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) 02/22/2016   SIRS (systemic inflammatory response syndrome) (HCC) 10/25/2022   PSH  Past Surgical History:  Procedure Laterality Date   A/V FISTULAGRAM Right 01/31/2023   Procedure: A/V Fistulagram;  Surgeon: Gretta Lonni PARAS, MD;  Location: [facility];  Service: Cardiovascular;  Laterality: Right;   AV FISTULA PLACEMENT Right 08/14/2022   Procedure: RIGHT ARM BRACHIOBASILIC ATERIOVENOUS FISTULA CREATION;  Surgeon: Magda Debby SAILOR, MD;  Location: [facility] OR;  Service: Vascular;  Laterality: Right;   BASCILIC VEIN TRANSPOSITION Right 11/13/2022   Procedure: RIGHT ARM SECOND STAGE BASILIC VEIN TRANSPOSITION;  Surgeon: Magda Debby SAILOR, MD;  Location: [facility] OR;  Service: Vascular;  Laterality: Right;   BIOPSY  01/01/2019   Procedure: BIOPSY;  Surgeon: Albertus Gordy HERO, MD;  Location: [facility] ENDOSCOPY;  Service: Endoscopy;;   BIOPSY  11/17/2021   Procedure: BIOPSY;  Surgeon: Charlanne Groom, MD;  Location: [facility];  Service: Gastroenterology;;   CESAREAN SECTION  1989   COLONOSCOPY  11/07/2020   2018   CYSTOSCOPY W/ URETERAL STENT PLACEMENT Left 08/01/2018   Procedure: CYSTOSCOPY WITH RETROGRADE PYELOGRAM/URETERAL STENT PLACEMENT;  Surgeon: Carolee Sherwood JONETTA DOUGLAS, MD;  Location:  [facility] ORS;  Service: Urology;  Laterality: Left;   CYSTOSCOPY WITH RETROGRADE PYELOGRAM, URETEROSCOPY AND STENT PLACEMENT Left 01/15/2019   Procedure: CYSTOSCOPY WITH RETROGRADE PYELOGRAM, URETEROSCOPY AND STENT PLACEMENT;  Surgeon: Carolee Sherwood JONETTA DOUGLAS, MD;  Location: [facility] ORS;  Service: Urology;  Laterality: Left;   ENTEROSCOPY N/A 08/16/2022   Procedure: ENTEROSCOPY;  Surgeon: Wilhelmenia Aloha Raddle., MD;  Location: [facility] ENDOSCOPY;  Service: Gastroenterology;  Laterality: N/A;   ENTEROSCOPY N/A 10/31/2022   Procedure: ENTEROSCOPY;  Surgeon: Albertus Gordy HERO,  MD;  Location: [facility] ENDOSCOPY;  Service: Gastroenterology;  Laterality: N/A;   ENTEROSCOPY N/A 05/09/2023   Procedure: ENTEROSCOPY;  Surgeon: Federico Rosario BROCKS, MD;  Location: [facility] ENDOSCOPY;  Service: Gastroenterology;  Laterality: N/A;   ENTEROSCOPY N/A 09/09/2023   Procedure: ENTEROSCOPY;  Surgeon: Federico Rosario BROCKS, MD;  Location: [facility] ENDOSCOPY;  Service: Gastroenterology;  Laterality: N/A;   ESOPHAGOGASTRODUODENOSCOPY (EGD) WITH PROPOFOL  N/A 01/01/2019   Procedure: ESOPHAGOGASTRODUODENOSCOPY (EGD) WITH PROPOFOL ;  Surgeon: Albertus Gordy HERO, MD;  Location: [facility] ENDOS[facility];  Service: Endoscopy;  Laterality: N/A;   ESOPHAGOGASTRODUODENOSCOPY (EGD) WITH PROPOFOL  N/A 08/21/2019   Procedure: ESOPHAGOGASTRODUODENOSCOPY (EGD) WITH PROPOFOL ;  Surgeon: Shila Gustav GAILS, MD;  Location: [facility] ENDOSCOPY;  Service: Endoscopy;  Laterality: N/A;   ESOPHAGOGASTRODUODENOSCOPY (EGD) WITH PROPOFOL  N/A 11/17/2021   Procedure: ESOPHAGOGASTRODUODENOSCOPY (EGD) WITH PROPOFOL ;  Surgeon: Charlanne Groom, MD;  Location: [facility] ENDOSCOPY;  Service: Gastroenterology;  Laterality: N/A;   FRACTURE SURGERY Left 2011   arm   HEMOSTASIS CLIP PLACEMENT  08/16/2022   Procedure: HEMOSTASIS CLIP PLACEMENT;  Surgeon: Wilhelmenia Aloha Raddle., MD;  Location: [facility] ENDOSCOPY;  Service: Gastroenterology;;   HOT HEMOSTASIS N/A 08/16/2022   Procedure: HOT HEMOSTASIS (ARGON PLASMA COAGULATION/BICAP);  Surgeon: Wilhelmenia Aloha Raddle., MD;  Location: [facility] ENDOSCOPY;  Service: Gastroenterology;  Laterality: N/A;   HOT HEMOSTASIS N/A 10/31/2022   Procedure: HOT HEMOSTASIS (ARGON PLASMA COAGULATION/BICAP);  Surgeon: Albertus Gordy HERO, MD;  Location: [facility] ENDOSCOPY;  Service: Gastroenterology;  Laterality: N/A;   HOT HEMOSTASIS N/A 05/09/2023   Procedure: HOT HEMOSTASIS (ARGON PLASMA COAGULATION/BICAP);  Surgeon: Federico Rosario BROCKS, MD;  Location: [facility] ENDOSCOPY;  Service: Gastroenterology;  Laterality: N/A;   HOT HEMOSTASIS N/A 09/09/2023   Procedure: EGD, WITH ARGON PLASMA COAGULATION;   Surgeon: Federico Rosario BROCKS, MD;  Location: [facility] ENDOSCOPY;  Service: Gastroenterology;  Laterality: N/A;   INSERTION OF DIALYSIS CATHETER Right 08/14/2022   Procedure: INSERTION OF TUNNELED DIALYSIS CATHETER USING PALINDROME CATHETER KIT 19CM;  Surgeon: Magda Debby SAILOR, MD;  Location: [facility] OR;  Service: Vascular;  Laterality: Right;   IR THORACENTESIS ASP PLEURAL SPACE W/IMG GUIDE  09/15/2023   POLYPECTOMY  08/16/2022   Procedure: POLYPECTOMY;  Surgeon: Wilhelmenia Aloha Raddle., MD;  Location: [facility] ENDOSCOPY;  Service: Gastroenterology;;   SUBMUCOSAL TATTOO INJECTION  08/16/2022   Procedure: SUBMUCOSAL TATTOO INJECTION;  Surgeon: Wilhelmenia Aloha Raddle., MD;  Location: [facility] ENDOSCOPY;  Service: Gastroenterology;;   UPPER GASTROINTESTINAL ENDOSCOPY     FH  Family History  Problem Relation Age of Onset   Diabetes Father    Colon cancer Neg Hx    Esophageal cancer Neg Hx    Rectal cancer Neg Hx    Stomach cancer Neg Hx    SH  reports that she has been smoking cigarettes. She has been exposed to tobacco smoke. She has never used smokeless tobacco. She reports that she does not currently use alcohol . She reports current drug use. Drug: Marijuana. Allergies  Allergies  Allergen Reactions   Zestril  [Lisinopril ] Other (See Comments)  Hyperkalemia   Home medications, not sure if taking; as prescribed: Prior to Admission medications   Medication Sig Start Date End Date Taking? Authorizing Provider  acetaminophen  (TYLENOL ) 325 MG tablet Take 2 tablets (650 mg total) by mouth every 6 (six) hours as needed for mild pain (pain score 1-3) (or Fever >/= 101). 07/01/23   Danton Reyes DASEN, MD  albuterol  (VENTOLIN  HFA) 108 [phone] Base) MCG/ACT inhaler Inhale 2 puffs into the lungs every 6 (six) hours as needed for wheezing or shortness of breath. 07/26/23   Gherghe, Costin M, MD  amLODipine  (NORVASC ) 10 MG tablet Take 1 tablet (10 mg total) by mouth daily. 07/26/23   Gherghe, Costin M, MD  atorvastatin  (LIPITOR) 10 MG  tablet Take 1 tablet (10 mg total) by mouth daily. 07/26/23   Gherghe, Costin M, MD  busPIRone  (BUSPAR ) 10 MG tablet Take 1 tablet (10 mg total) by mouth 3 (three) times daily. 07/26/23 11/14/23  Gherghe, Costin M, MD  calcitRIOL  (ROCALTROL ) 0.25 MCG capsule Take 1 capsule (0.25 mcg total) by mouth daily. 07/26/23   Gherghe, Costin M, MD  carvedilol  (COREG ) 6.25 MG tablet Take 1 tablet (6.25 mg total) by mouth 2 (two) times daily with a meal. 10/15/23   Vicci Barnie NOVAK, MD  ferrous sulfate  (FEROSUL) 325 (65 FE) MG tablet Take 1 tablet (325 mg total) by mouth daily with breakfast. 10/15/23   Vicci Barnie NOVAK, MD  gabapentin  (NEURONTIN ) 100 MG capsule Take 1 capsule (100 mg total) by mouth 3 (three) times daily. 10/15/23 01/13/24  Vicci Barnie NOVAK, MD  nicotine  (NICODERM CQ  - DOSED IN MG/24 HOURS) 21 mg/24hr patch Place 1 patch (21 mg total) onto the skin daily. 07/27/23   Gherghe, Costin M, MD  oxyCODONE  (OXY IR/ROXICODONE ) 5 MG immediate release tablet Take 1 tablet (5 mg total) by mouth every 4 (four) hours as needed for severe pain (pain score 7-10). 07/26/23   Gherghe, Costin M, MD  pantoprazole  (PROTONIX ) 40 MG tablet Take 1 tablet (40 mg total) by mouth 2 (two) times daily. 07/26/23 11/14/23  Trixie Nilda HERO, MD  sertraline  (ZOLOFT ) 25 MG tablet Take 1 tablet (25 mg total) by mouth daily. 07/26/23   Gherghe, Costin M, MD  sodium bicarbonate  650 MG tablet Take 2 tablets (1,300 mg total) by mouth 2 (two) times daily. 10/15/23 01/13/24  Vicci Barnie NOVAK, MD  torsemide  (DEMADEX ) 100 MG tablet Take 1 tablet (100 mg total) by mouth daily. 10/15/23   Vicci Barnie NOVAK, MD  traZODone  (DESYREL ) 50 MG tablet Take 1 tablet (50 mg total) by mouth at bedtime as needed for sleep. 07/26/23   Gherghe, Costin M, MD  TRELEGY ELLIPTA  100-62.5-25 MCG/ACT AEPB Inhale 1 puff into the lungs daily. 07/26/23   Gherghe, Costin M, MD    Current Medications Scheduled Meds:  atorvastatin   10 mg Oral Daily    budesonide -glycopyrrolate -formoterol   2 puff Inhalation BID   busPIRone   10 mg Oral TID   Chlorhexidine  Gluconate Cloth  6 each Topical Q0600   ferrous sulfate   325 mg Oral Q breakfast   gabapentin   100 mg Oral TID   nicotine   21 mg Transdermal Daily   pantoprazole  (PROTONIX ) IV  40 mg Intravenous BID   sodium bicarbonate   1,300 mg Oral BID   sodium chloride  flush  3 mL Intravenous Q12H   Continuous Infusions:  furosemide      furosemide      PRN Meds:.ipratropium-albuterol , nicotine  polacrilex  CBC Recent Labs  Lab 10/22/23 2351 10/23/23 0001  WBC 6.2  --   NEUTROABS 4.1  --   HGB 5.3* 5.1*  HCT 17.1* 15.0*  MCV 97.2  --   PLT 293  --    Basic Metabolic Panel Recent Labs  Lab 10/22/23 2351 10/23/23 0001  NA 130* 131*  K 4.2 4.4  CL 100  --   CO2 9*  --   GLUCOSE 84  --   BUN 92*  --   CREATININE 5.59*  --   CALCIUM  8.1*  --     Physical Exam  Blood pressure (!) 146/74, pulse 93, temperature 97.9 F (36.6 C), temperature source Oral, resp. rate 20, height 5' 7 (1.702 m), weight 63.5 kg, SpO2 98%. GEN: chronically ill appearing  ENT: NCAT EYES: EOMI CV: regular, no rub PULM: nl WOB. CTAB, diminished in bases ABD: s/nt/n SKIN: no rashes/lesions EXT: No sig LEE VASC: RUE AVF  +b/t  Assessment 60F ESRD here with progressive dysnpea, hypoxia, and AoC anemia.  ESRD: some residual RRF. Uses RUE AVF.  HD today.  Nonadherent and comes to hospital for HD.  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   ESA Daily weights, Daily Renal Panel, Strict I/Os, Avoid nephrotoxins (NSAIDs, judicious IV Contrast)   [name]  10/23/2023, 8:27 AM

## 2023-10-23 NOTE — TOC Progression Note (Signed)
 Transition of Care [facility]) - Progression Note    Patient Details  Name: [name] MRN: [id number] Date of Birth: May 24, 1960  [facility] CM/SW Contact  Montie LOISE Louder, [place] Phone Number: 10/23/2023, 10:58 AM  Clinical Narrative:     Per chart review patient receives monthly widow pension. She reports homeless/staying at hotels.   CM noted on 09/08/23, patient contacted [facility], they took her information but it appears she did not d/c there. CM provided good,transportation and other community resources. CSW noted on 09/23/23- patient reports working with the [facility] to assist with housing, CSW provided shelter list.   TOC will follow and assist with discharge planning.   Montie Louder, MSW, LCSW Clinical Social Worker                     Expected Discharge Plan and Services                                               Social Drivers of Health (SDOH) Interventions SDOH Screenings   Food Insecurity: Food Insecurity Present (09/23/2023)  Housing: High Risk (09/23/2023)  Transportation Needs: Unmet Transportation Needs (09/23/2023)  Utilities: Patient Unable To Answer (09/23/2023)  Recent Concern: Utilities - At Risk (07/25/2023)  Alcohol  Screen: Low Risk  (02/10/2023)  Depression (PHQ2-9): Medium Risk (06/11/2023)  Financial Resource Strain: High Risk (02/10/2023)  Physical Activity: Inactive (02/10/2023)  Social Connections: Socially Isolated (06/12/2023)  Tobacco Use: High Risk (10/22/2023)  Health Literacy: Adequate Health Literacy (02/10/2023)    Readmission Risk Interventions    07/01/2023   12:39 PM 06/16/2023    4:04 PM 01/28/2023   10:32 AM  Readmission Risk Prevention Plan  Transportation Screening Complete Complete Complete  Medication Review (RN Care Manager) Referral to Pharmacy Complete Complete  PCP or Specialist appointment within 3-5 days of discharge Complete Complete   HRI or Home Care Consult Complete Complete   SW  Recovery Care/Counseling Consult Complete Complete   Palliative Care Screening Not Applicable Not Applicable   Skilled Nursing Facility Not Applicable Not Applicable

## 2023-10-24 ENCOUNTER — Inpatient Hospital Stay (HOSPITAL_COMMUNITY)

## 2023-10-24 ENCOUNTER — Other Ambulatory Visit: Payer: Self-pay

## 2023-10-24 DIAGNOSIS — K5900 Constipation, unspecified: Secondary | ICD-10-CM

## 2023-10-24 LAB — CBC WITH DIFFERENTIAL/PLATELET
Abs Immature Granulocytes: 0.04 K/uL (ref 0.00–0.07)
Abs Immature Granulocytes: 0.06 K/uL (ref 0.00–0.07)
Basophils Absolute: 0 K/uL (ref 0.0–0.1)
Basophils Absolute: 0.1 K/uL (ref 0.0–0.1)
Basophils Relative: 1 %
Basophils Relative: 1 %
Eosinophils Absolute: 0.1 K/uL (ref 0.0–0.5)
Eosinophils Absolute: 0.3 K/uL (ref 0.0–0.5)
Eosinophils Relative: 2 %
Eosinophils Relative: 4 %
HCT: 20.9 % — ABNORMAL LOW (ref 36.0–46.0)
HCT: 22.6 % — ABNORMAL LOW (ref 36.0–46.0)
Hemoglobin: 7.3 g/dL — ABNORMAL LOW (ref 12.0–15.0)
Hemoglobin: 7.7 g/dL — ABNORMAL LOW (ref 12.0–15.0)
Immature Granulocytes: 1 %
Immature Granulocytes: 1 %
Lymphocytes Relative: 14 %
Lymphocytes Relative: 15 %
Lymphs Abs: 0.8 K/uL (ref 0.7–4.0)
Lymphs Abs: 1.1 K/uL (ref 0.7–4.0)
MCH: 30.8 pg (ref 26.0–34.0)
MCH: 30.9 pg (ref 26.0–34.0)
MCHC: 34.9 g/dL (ref 30.0–36.0)
MCHC: 34.1 g/dL (ref 30.0–36.0)
MCV: 88.2 fL (ref 80.0–100.0)
MCV: 90.8 fL (ref 80.0–100.0)
Monocytes Absolute: 0.6 K/uL (ref 0.1–1.0)
Monocytes Absolute: 0.9 K/uL (ref 0.1–1.0)
Monocytes Relative: 9 %
Monocytes Relative: 12 %
Neutro Abs: 4.5 K/uL (ref 1.7–7.7)
Neutro Abs: 5 K/uL (ref 1.7–7.7)
Neutrophils Relative %: 73 %
Neutrophils Relative %: 67 %
Platelets: 286 K/uL (ref 150–400)
Platelets: 289 K/uL (ref 150–400)
RBC: 2.37 MIL/uL — ABNORMAL LOW (ref 3.87–5.11)
RBC: 2.49 MIL/uL — ABNORMAL LOW (ref 3.87–5.11)
RDW: 18.1 % — ABNORMAL HIGH (ref 11.5–15.5)
RDW: 18.6 % — ABNORMAL HIGH (ref 11.5–15.5)
WBC: 6.1 K/uL (ref 4.0–10.5)
WBC: 7.4 K/uL (ref 4.0–10.5)
nRBC: 0 % (ref 0.0–0.2)
nRBC: 0 % (ref 0.0–0.2)

## 2023-10-24 LAB — HEPATITIS B SURFACE ANTIBODY, QUANTITATIVE: Hep B S AB Quant (Post): 3.8 m[IU]/mL — ABNORMAL LOW

## 2023-10-24 MED ORDER — SUCRALFATE 1 GM/10ML PO SUSP
1.0000 g | Freq: Three times a day (TID) | ORAL | Status: DC
  Administered 2023-10-24 – 2023-10-26 (×5): 1 g via ORAL
  Filled 2023-10-24 (×6): qty 10

## 2023-10-24 MED ORDER — BISACODYL 10 MG RE SUPP
10.0000 mg | Freq: Once | RECTAL | Status: AC
  Administered 2023-10-24: 10 mg via RECTAL
  Filled 2023-10-24: qty 1

## 2023-10-24 MED ORDER — GUAIFENESIN 100 MG/5ML PO LIQD
5.0000 mL | ORAL | Status: DC | PRN
  Administered 2023-10-26: 5 mL via ORAL
  Filled 2023-10-24: qty 10

## 2023-10-24 MED ORDER — POLYETHYLENE GLYCOL 3350 17 G PO PACK: 17.0000 g | PACK | Freq: Two times a day (BID) | ORAL | Status: DC

## 2023-10-24 MED ORDER — POLYETHYLENE GLYCOL 3350 17 G PO PACK
17.0000 g | PACK | Freq: Every day | ORAL | Status: DC
  Administered 2023-10-24 – 2023-10-28 (×7): 17 g via ORAL
  Filled 2023-10-24 (×5): qty 1

## 2023-10-24 MED ORDER — BISACODYL 5 MG PO TBEC
10.0000 mg | DELAYED_RELEASE_TABLET | Freq: Once | ORAL | Status: AC
  Administered 2023-10-24: 10 mg via ORAL
  Filled 2023-10-24: qty 2

## 2023-10-24 MED ORDER — FAMOTIDINE IN NACL 20-0.9 MG/50ML-% IV SOLN
20.0000 mg | Freq: Every day | INTRAVENOUS | Status: DC
  Administered 2023-10-24 – 2023-10-26 (×3): 20 mg via INTRAVENOUS
  Filled 2023-10-24 (×6): qty 50

## 2023-10-24 MED ORDER — TRAZODONE HCL 50 MG PO TABS
50.0000 mg | ORAL_TABLET | Freq: Every evening | ORAL | Status: DC | PRN
  Administered 2023-10-24 – 2023-10-31 (×11): 50 mg via ORAL
  Filled 2023-10-24 (×8): qty 1

## 2023-10-24 MED ORDER — LACTULOSE 10 GM/15ML PO SOLN
20.0000 g | Freq: Once | ORAL | Status: AC
  Administered 2023-10-24: 20 g via ORAL
  Filled 2023-10-24: qty 30

## 2023-10-24 MED ORDER — BISACODYL 10 MG RE SUPP
10.0000 mg | Freq: Every day | RECTAL | Status: DC
  Administered 2023-10-29 (×2): 10 mg via RECTAL
  Filled 2023-10-24 (×6): qty 1

## 2023-10-24 MED ORDER — AMLODIPINE BESYLATE 10 MG PO TABS
10.0000 mg | ORAL_TABLET | Freq: Every day | ORAL | Status: DC
  Administered 2023-10-24 – 2023-11-02 (×12): 10 mg via ORAL
  Filled 2023-10-24 (×9): qty 1

## 2023-10-24 MED ORDER — DARBEPOETIN ALFA 100 MCG/0.5ML IJ SOSY
100.0000 ug | PREFILLED_SYRINGE | Freq: Once | INTRAMUSCULAR | Status: AC
  Administered 2023-10-24: 100 ug via SUBCUTANEOUS
  Filled 2023-10-24: qty 0.5

## 2023-10-24 NOTE — Progress Notes (Signed)
 Admit: 10/22/2023 LOS: 1  73F ESRD here with progressive dysnpea, hypoxia, and AoC anemia.   Subjective:  HD yesterday 2.7L  Hb 7.9 this AM  08/07 0701 - 08/08 0700 In: 970 [P.O.:970] Out: 3300 [Urine:600]  Filed Weights   10/22/23 2335 10/24/23 0500  Weight: 63.5 kg 48.6 kg    Scheduled Meds:  amLODipine   10 mg Oral Daily   atorvastatin   10 mg Oral Daily   budesonide -glycopyrrolate -formoterol   2 puff Inhalation BID   busPIRone   10 mg Oral TID   carvedilol   6.25 mg Oral BID WC   Chlorhexidine  Gluconate Cloth  6 each Topical Q0600   darbepoetin (ARANESP ) injection - DIALYSIS  100 mcg Subcutaneous Once   ferrous sulfate   325 mg Oral Q breakfast   gabapentin   100 mg Oral TID   nicotine   21 mg Transdermal Daily   pantoprazole  (PROTONIX ) IV  40 mg Intravenous BID   polyethylene glycol  17 g Oral Daily   sodium bicarbonate   1,300 mg Oral BID   sodium chloride  flush  3 mL Intravenous Q12H   Continuous Infusions:  furosemide  160 mg (10/24/23 0832)   PRN Meds:.acetaminophen , ALPRAZolam , guaiFENesin , ipratropium-albuterol , nicotine  polacrilex, oxyCODONE , traZODone   Current Labs: reviewed    Physical Exam:  Blood pressure (!) 163/104, pulse 89, temperature 97.9 F (36.6 C), temperature source Axillary, resp. rate 17, height 5' 7 (1.702 m), weight 48.6 kg, SpO2 100%. GEN: chronically ill appearing  ENT: NCAT EYES: EOMI CV: regular, no rub PULM: nl WOB. CTAB, diminished in bases ABD: s/nt/n SKIN: no rashes/lesions EXT: No sig LEE VASC: RUE AVF  +b/t  A ESRD: some residual RRF. Uses RUE AVF.  HD cont on THS schedule at this time.  Nonadherent and comes to hospital for HD.  Not historically a candidate for outpt CLIP, discuss with renal navigator AHRF / DYspnea: from #3 and hypervolemia: Cont UF as able AoC Anemia, hx/o GIB for GI eval; ESA qThursday given 8/7.  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 Morphine  should not be used.  Baclofen should be avoided Avoid oral sodium phosphate  and magnesium  citrate based laxatives / bowel preps    Bernardino Gasman MD 10/24/2023, 9:14 AM  Recent Labs  Lab 10/22/23 2351 10/23/23 0001  NA 130* 131*  K 4.2 4.4  CL 100  --   CO2 9*  --   GLUCOSE 84  --   BUN 92*  --   CREATININE 5.59*  --   CALCIUM  8.1*  --    Recent Labs  Lab 10/22/23 2351 10/23/23 0001 10/23/23 1141 10/23/23 1807  WBC 6.2  --   --  6.3  NEUTROABS 4.1  --   --  5.1  HGB 5.3* 5.1* 9.2* 7.9*  HCT 17.1* 15.0* 26.3* 23.3*  MCV 97.2  --   --  88.9  PLT 293  --   --  324

## 2023-10-24 NOTE — Progress Notes (Signed)
 Mobility Specialist Progress Note:    10/24/23 0958  Mobility  Activity Ambulated with assistance;Pivoted/transferred from bed to chair  Level of Assistance Minimal assist, patient does 75% or more  Assistive Device Front wheel walker  Distance Ambulated (ft) 160 ft  Activity Response Tolerated well  Mobility Referral Yes  Mobility visit 1 Mobility  Mobility Specialist Start Time (ACUTE ONLY) [id number]  Mobility Specialist Stop Time (ACUTE ONLY) 1010  Mobility Specialist Time Calculation (min) (ACUTE ONLY) 12 min   Pt received in bed, agreeable to mobility session after encouragement. Ambulated in hallway with RW and chair follow. Tolerated well, c/o HA, stomach pain, blurry vision, indigestion, nausea, weakness and had several productive coughs throughout session. Returned pt to room, sitting up comfortably with all needs met, call bell in reach. RN notified.    Donnielle Addison Mobility Specialist Please contact via Special educational needs teacher or  Rehab office at [phone]

## 2023-10-24 NOTE — Progress Notes (Signed)
 Daily Progress Note  DOA: 10/22/2023 Hospital Day: 3   Patient Profile:   63 year old female with medical history of ESRD on HD , CHF, substance abuse, COPD, hypertension, untreated hepatitis C, chronic anemia, gastrointestinal AVMs, and adenomatous colon polyps.  See PMH for any additional medical history.  Admitted with recurrent acute on chronic anemia, dyspnea and volume overload. See 8/7 GI consult note    ASSESSMENT    Acute on chronic anemia AVM disease s/p multiple endoscopies with APC ( last one late June 2025) Presenting hemoglobin 5.3 (confirmed with recheck).  This is down from 9.4 less than a month ago.Stools dark on iron  TODAY>> Hgb improved to 7.3 after 2 u blood  Chest pain  Reflux?  Yesterday Rapid response was called for chest pain  She was transiently bradycardic. CT negative for PE. Troponins  in 20's and flat.  TODAY>>> She continues to complains of chest pain but describes it as more of burning and sour'. Feels terrible all over.    Constipation No significant stool output in several days. KUB report pending but appears to have a lot of stool in colon. Abdominal distention possibly due to both constipation and some ascites   COPD / chronic cough.  Today>> non-productive cough   ESRD on HD but not adherent (homeless) Volume overload Dyspnea on admission  CTA shows bilateral pleural effusions , pulmonary edema, ascites, anasarca .  Dialyzed on 10/23/2023, next session 10/25/2023.    Prolonged QT   Principal Problem:   Volume overload Active Problems:   Acute on chronic anemia   CHF exacerbation (HCC)   ESRD on dialysis (HCC)   Angiodysplasia of upper gastrointestinal tract   PLAN   --Continue Pantoprazole  PO BID --Add Pepcid  at bedtime --Short course of liquid carafate  TID before meals ( ESRD) --Dulcolax PO 10 mg now --Dulcolax supp now and daily  --Increase MIralax  to BID --Will decide on whether to repeat enteroscopy this admission. She  looks a little better today compared to yesterday but still overall feels poorly. Ideally she could start on Subq Sandostatin as outpatient but her socioeconomic situation will likely make this difficult / impossible to obtain.   GI Studies:    Recent Labs    10/22/23 2351 10/23/23 0001 10/23/23 1141 10/23/23 1807 10/24/23 0911  WBC 6.2  --   --  6.3 6.1  HGB 5.3*   < > 9.2* 7.9* 7.3*  HCT 17.1*   < > 26.3* 23.3* 20.9*  MCV 97.2  --   --  88.9 88.2  PLT 293  --   --  324 286   < > = values in this interval not displayed.   Recent Labs    10/23/23 1141  TIBC 305  IRONPCTSAT 84*   Recent Labs    10/22/23 2351 10/23/23 0001  NA 130* 131*  K 4.2 4.4  CL 100  --   CO2 9*  --   GLUCOSE 84  --   BUN 92*  --   CREATININE 5.59*  --   CALCIUM  8.1*  --    Recent Labs    10/22/23 2351  PROT 6.7  ALBUMIN  3.0*  AST 18  ALT 12  ALKPHOS 83  BILITOT 0.6     Imaging:  ECHOCARDIOGRAM COMPLETE    ECHOCARDIOGRAM REPORT       Patient Name:   [name] Shuck Date of Exam: 10/23/2023 Medical Rec #:  [id number]      Height:  67.0 in Accession #:    [id number]     Weight:       140.0 lb Date of Birth:  Aug 01, 1960      BSA:          1.738 m Patient Age:    63 years       BP:           146/74 mmHg Patient Gender: F              HR:           93 bpm. Exam Location:  Inpatient  Procedure: 2D Echo, Color Doppler and Cardiac Doppler (Both Spectral and Color            Flow Doppler were utilized during procedure).  Indications:    CHF I50.9   History:        Patient has prior history of Echocardiogram examinations, most                 recent 01/25/2023.   Sonographer:    Tinnie Gosling RDCS Referring Phys: [id number] DORN DAWSON  IMPRESSIONS   1. Left ventricular ejection fraction, by estimation, is 60 to 65%. The left ventricle has normal function. The left ventricle has no regional wall motion abnormalities. The left ventricular internal cavity size was mildly dilated.  Left ventricular  diastolic parameters are consistent with Grade II diastolic dysfunction (pseudonormalization). Elevated left atrial pressure.  2. Right ventricular systolic function is normal. The right ventricular size is normal. Tricuspid regurgitation signal is inadequate for assessing PA pressure.  3. Left atrial size was moderately dilated.  4. The mitral valve is degenerative. Mild mitral valve regurgitation. No evidence of mitral stenosis. Moderate mitral annular calcification.  5. The aortic valve was not well visualized. Aortic valve regurgitation is not visualized. No aortic stenosis is present.  6. The inferior vena cava is normal in size with greater than 50% respiratory variability, suggesting right atrial pressure of 3 mmHg.  FINDINGS  Left Ventricle: Left ventricular ejection fraction, by estimation, is 60 to 65%. The left ventricle has normal function. The left ventricle has no regional wall motion abnormalities. The left ventricular internal cavity size was mildly dilated. There is  no left ventricular hypertrophy. Left ventricular diastolic parameters are consistent with Grade II diastolic dysfunction (pseudonormalization). Elevated left atrial pressure.  Right Ventricle: The right ventricular size is normal. No increase in right ventricular wall thickness. Right ventricular systolic function is normal. Tricuspid regurgitation signal is inadequate for assessing PA pressure.  Left Atrium: Left atrial size was moderately dilated.  Right Atrium: Right atrial size was normal in size.  Pericardium: There is no evidence of pericardial effusion.  Mitral Valve: The mitral valve is degenerative in appearance. There is mild thickening of the mitral valve leaflet(s). There is mild calcification of the mitral valve leaflet(s). Moderate mitral annular calcification. Mild mitral valve regurgitation. No  evidence of mitral valve stenosis. MV peak gradient, 8.3 mmHg. The mean mitral valve  gradient is 5.0 mmHg.  Tricuspid Valve: The tricuspid valve is normal in structure. Tricuspid valve regurgitation is not demonstrated. No evidence of tricuspid stenosis.  Aortic Valve: The aortic valve was not well visualized. Aortic valve regurgitation is not visualized. No aortic stenosis is present.  Pulmonic Valve: The pulmonic valve was normal in structure. Pulmonic valve regurgitation is not visualized. No evidence of pulmonic stenosis.  Aorta: The aortic root is normal in size and structure.  Venous: The inferior vena cava is normal  in size with greater than 50% respiratory variability, suggesting right atrial pressure of 3 mmHg.  IAS/Shunts: No atrial level shunt detected by color flow Doppler.    LEFT VENTRICLE PLAX 2D LVIDd:         4.80 cm   Diastology LVIDs:         3.60 cm   LV e' medial:    7.77 cm/s LV PW:         0.90 cm   LV E/e' medial:  17.1 LV IVS:        0.90 cm   LV e' lateral:   11.30 cm/s LVOT diam:     2.00 cm   LV E/e' lateral: 11.8 LV SV:         76 LV SV Index:   44 LVOT Area:     3.14 cm    RIGHT VENTRICLE             IVC RV S prime:     14.90 cm/s  IVC diam: 1.80 cm TAPSE (M-mode): 2.4 cm  LEFT ATRIUM             Index        RIGHT ATRIUM           Index LA diam:        3.40 cm 1.96 cm/m   RA Area:     10.60 cm LA Vol (A2C):   85.1 ml 48.97 ml/m  RA Volume:   20.90 ml  12.03 ml/m LA Vol (A4C):   54.8 ml 31.53 ml/m LA Biplane Vol: 69.6 ml 40.05 ml/m  AORTIC VALVE LVOT Vmax:   133.00 cm/s LVOT Vmean:  93.300 cm/s LVOT VTI:    0.242 m   AORTA Ao Root diam: 2.30 cm  MITRAL VALVE MV Area (PHT): 6.32 cm     SHUNTS MV Area VTI:   2.90 cm     Systemic VTI:  0.24 m MV Peak grad:  8.3 mmHg     Systemic Diam: 2.00 cm MV Mean grad:  5.0 mmHg MV Vmax:       1.44 m/s MV Vmean:      109.0 cm/s MV Decel Time: 120 msec MV E velocity: 133.00 cm/s MV A velocity: 151.00 cm/s MV E/A ratio:  0.88  Wilbert Bihari MD Electronically signed by Wilbert Bihari MD Signature Date/Time: 10/23/2023/5:47:34 PM      Final   CT Angio Chest Pulmonary Embolism (PE) W or WO Contrast CLINICAL DATA:  Pulmonary embolism (PE) suspected, high prob.  EXAM: CT ANGIOGRAPHY CHEST WITH CONTRAST  TECHNIQUE: Multidetector CT imaging of the chest was performed using the standard protocol during bolus administration of intravenous contrast. Multiplanar CT image reconstructions and MIPs were obtained to evaluate the vascular anatomy.  RADIATION DOSE REDUCTION: This exam was performed according to the departmental dose-optimization program which includes automated exposure control, adjustment of the mA and/or kV according to patient size and/or use of iterative reconstruction technique.  CONTRAST:  75mL OMNIPAQUE  IOHEXOL  350 MG/ML SOLN  COMPARISON:  CT angiography chest performed earlier the same day at 4:16 a.m.  FINDINGS: There is satisfactory opacification of bilateral pulmonary arteries. There is no embolism to the proximal subsegmental pulmonary artery level.  Multiple other findings such as cardiomegaly, bilateral pleural effusions - left more than right with associated compressive atelectatic changes, diffuse bronchial wall thickening/edema, findings favoring congestive heart failure/pulmonary edema, benign/reactive mediastinal lymph nodes, upper abdominal ascites, mild-to-moderate anasarca, diffuse sclerosed bones, most likely due to  renal osteodystrophy), etc. are essentially unchanged since the prior study from earlier the same day.  There is interval clearing of previously noted linear filling defect in the right bronchus intermedius/lower lobe bronchus, suggesting interval clearing of mucus/secretions.  Rest of the findings are essentially unchanged. Please refer to CTA chest report for details.  Review of the MIP images confirms the above findings.  IMPRESSION: 1. No embolism to the proximal subsegmental pulmonary artery  level. 2. Interval clearing of previously noted linear filling defect in the right bronchus intermedius/lower lobe bronchus, suggesting interval clearing of mucus/secretions. 3. Multiple other findings are essentially unchanged since the prior study from earlier the same day. Please refer to CT Angiography chest report for details.  Electronically Signed   By: Ree Molt M.D.   On: 10/23/2023 16:58 CT Angio Chest PE W and/or Wo Contrast CLINICAL DATA:  Homeless patient with end-stage renal failure, missed last 3 weeks of dialysis. Presents in respiratory distress. Concern for pulmonary embolism.  EXAM: CT ANGIOGRAPHY CHEST WITH CONTRAST  TECHNIQUE: Multidetector CT imaging of the chest was performed using the standard protocol during bolus administration of intravenous contrast. Multiplanar CT image reconstructions and MIPs were obtained to evaluate the vascular anatomy.  RADIATION DOSE REDUCTION: This exam was performed according to the departmental dose-optimization program which includes automated exposure control, adjustment of the mA and/or kV according to patient size and/or use of iterative reconstruction technique.  CONTRAST:  75mL OMNIPAQUE  IOHEXOL  350 MG/ML SOLN  COMPARISON:  Chest CT without contrast 11/15/2021, abdomen and pelvis CT without contrast 07/23/2023.  FINDINGS: Cardiovascular: There is panchamber moderate cardiomegaly, increased from 07/23/2023, with left chamber predominance and venous distension.  There is no substantial pericardial effusion. There is patchy three-vessel coronary artery calcification.  The pulmonary arteries are upper limit of normal in caliber. Respiratory motion and pleural effusions obscure the subsegmental arterial bed in the lower lobes but no arterial embolism is seen elsewhere.  There is mild scattered calcific plaque in the aorta and great vessels without aneurysm, dissection or stenosis.  Mediastinum/Nodes:  There is a single prominent right mid hilar lymph node, 1 cm in short axis on 5:75.  There are prominent lower right paratracheal and subcarinal lymph nodes both measuring 1.3 cm in short axis.  There is no bulky, encasing or further adenopathy including the axillary and supraclavicular spaces.  Negative thyroid gland, thoracic trachea and thoracic esophagus.  Lungs/Pleura: There is mild interstitial edema in the lung apices and bases. There R increased moderate-sized left and small right layering pleural effusions.  Some of the right pleural fluid could be loculated laterally and anteriorly in the chest base.  There is a mucoid septation in the distal right main bronchus and right lower lobe bronchus.  In the lower lobes there is compressive atelectasis or consolidation alongside both pleural effusions.  There is increased consolidation versus atelectasis in the lingular base, including compared with 07/23/2023.  There is scattered linear scarring or atelectasis in the lower lung fields.  There is diffuse bronchial thickening. No bronchial impaction is seen. Rest of the lungs appear clear.  Upper Abdomen: Upper abdominal ascites, mild amount surrounding the liver and spleen and partially filling the lesser sac. There is aortic and branch vessel atherosclerosis. No free air.  Musculoskeletal: Increased body wall anasarca over both prior studies.  There is degenerative change and mild kyphosis thoracic spine. Dense bones consistent with renal osteodystrophy.  No acute or other significant osseous findings. No mass in the visualized chest wall  is seen.  Review of the MIP images confirms the above findings.  IMPRESSION: 1. No evidence of arterial embolus. 2. Cardiomegaly with left chamber predominance, venous distension, and mild interstitial edema. Findings consistent with CHF or fluid overload. 3. Increased moderate-sized left and small right layering  pleural effusions. Some of the right pleural fluid could be loculated laterally and anteriorly in the chest base. 4. Compressive atelectasis or consolidation in the adjacent lower lobes. 5. Increased consolidation versus atelectasis in the lingular base. 6. Diffuse bronchial thickening. 7. Mucoid septation in the distal right main bronchus and right lower lobe bronchus. 8. Upper abdominal ascites. 9. Increased body wall anasarca. 10. Renal osteodystrophy. 11. Aortic and coronary artery atherosclerosis.  Aortic Atherosclerosis (ICD10-I70.0).  Electronically Signed   By: Francis Quam M.D.   On: 10/23/2023 05:03 DG Chest Port 1 View CLINICAL DATA:  Resp distress  EXAM: PORTABLE CHEST 1 VIEW  COMPARISON:  Chest x-ray 10/05/2023  FINDINGS: Patient is rotated.  The heart and mediastinal contours are unchanged.  Bibasilar airspace opacities. Chronic coarsened interstitial markings with no overt pulmonary edema. Persistent bilateral pleural effusions trace to small volume pleural effusions. No pneumothorax.  No acute osseous abnormality.  IMPRESSION: Persistent bilateral pleural effusions trace to small volume pleural effusions.  Electronically Signed   By: Morgane  Naveau M.D.   On: 10/23/2023 00:13     Scheduled inpatient medications:   amLODipine   10 mg Oral Daily   atorvastatin   10 mg Oral Daily   budesonide -glycopyrrolate -formoterol   2 puff Inhalation BID   busPIRone   10 mg Oral TID   carvedilol   6.25 mg Oral BID WC   Chlorhexidine  Gluconate Cloth  6 each Topical Q0600   ferrous sulfate   325 mg Oral Q breakfast   gabapentin   100 mg Oral TID   nicotine   21 mg Transdermal Daily   pantoprazole  (PROTONIX ) IV  40 mg Intravenous BID   polyethylene glycol  17 g Oral Daily   sodium bicarbonate   1,300 mg Oral BID   sodium chloride  flush  3 mL Intravenous Q12H   Continuous inpatient infusions:   furosemide  160 mg (10/24/23 1333)   PRN inpatient medications:  acetaminophen , ALPRAZolam , guaiFENesin , ipratropium-albuterol , nicotine  polacrilex, oxyCODONE , traZODone   Vital signs in last 24 hours: Temp:  [97.7 F (36.5 C)-98.7 F (37.1 C)] 98 F (36.7 C) (08/08 1155) Pulse Rate:  [76-101] 89 (08/08 0742) Resp:  [14-28] 16 (08/08 1155) BP: (129-179)/(64-104) 149/67 (08/08 1155) SpO2:  [99 %-100 %] 100 % (08/08 1155) Weight:  [48.6 kg] 48.6 kg (08/08 0500) Last BM Date :  (PTA)  Intake/Output Summary (Last 24 hours) at 10/24/2023 1338 Last data filed at 10/24/2023 0340 Gross per 24 hour  Intake 580 ml  Output 3300 ml  Net -2720 ml    Intake/Output from previous day: 08/07 0701 - 08/08 0700 In: 970 [P.O.:970] Out: 3300 [Urine:600] Intake/Output this shift: No intake/output data recorded.   Physical Exam:  General: Alert female in NAD Heart:  Regular rate and rhythm.  Pulmonary: Normal respiratory effort Abdomen: Soft, moderately distended, mild generalized tenderness, tympanitic, bulging flanks.  Normal bowel sounds. Neurologic: Alert and oriented Psych: Pleasant. Cooperative     LOS: 1 day   Vina Dasen ,NP 10/24/2023, 1:38 PM

## 2023-10-24 NOTE — Progress Notes (Signed)
 Case discussed with nephrologist this morning. Pt known to navigator. Pt has been non-compliant with out-pt HD in the past and has been d/c from 2 local clinics for this reason. Pt has also not been agreeable to out-pt HD in the recent past either. Pt has multiple social barriers. Pt does not have an out-pt HD clinic for the above reasons.   Randine Mungo Dialysis Navigator [phone]

## 2023-10-24 NOTE — Progress Notes (Signed)
 PROGRESS NOTE    [name]  FMW:[id number] DOB: May 05, 1960 DOA: 10/22/2023 PCP: Pcp, No   Brief Narrative:  HPI:  [name] is a 63 y.o. 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  Did not receive any HD until she returned to the ED on 10/22/2023 with respiratory distress.   She has a chronic cough which is nonproductive no recent fevers. Has been out of medications over past few weeks. She does not live near the hospital and so getting here for dialysis is a struggle, needing to hitch rides etc. She is upset about her illness progressing and she says she's more open to social work resources. Still smoking 1 PPD cigarettes and she snorts cocaine which she acknowledges ongoing use, no crack use.  Details below.  Assessment & Plan:   Principal Problem:   Volume overload Active Problems:   Acute on chronic anemia   CHF exacerbation (HCC)   ESRD on dialysis (HCC)   Angiodysplasia of upper gastrointestinal tract  Acute hypoxic respiratory failure secondary to volume overload/acute on chronic congestive heart failure with preserved ejection fraction, persistent bilateral pleural effusion and anemia/ESRD on HD, POA: Patient was placed on BiPAP upon arrival due to hypoxia.  Over the course of few hours, she improved significantly and was weaned to room air.  BNP slightly elevated than her baseline.  Denies any shortness of breath.  Received hemodialysis yesterday.  Management/volume adjustment per nephrology.  Acute on chronic anemia/acute blood loss anemia/symptomatic anemia/history of AVM: Patient has a history of  AVM, hemoglobin 2 weeks ago was 9.4, she presented with a 5.1.  Received 2 units of PRBC transfusion, repeat posttransfusion hemoglobin 9.2 yesterday which then dropped to 7.9, morning labs are pending.  GI saw patient, plan for small bowel enteroscopy.   ACS ruled out/demand ischemia ruled in: Troponins were checked which were in 20s and flat indicative of demand ischemia.  No chest pain.  Elevated D-dimer but CTA negative for PE.  Hyponatremia, mild and chronic, likely from hypervolemia.   Qtc prolongation - 511 -- Avoid QT prolonging meds    Social determinants: Homelessness, TOC consulted.   COPD/chronic cough: Without exacerbation. Nebs prn, continue home Breztri .  Robitussin as needed.  Hypertension: Blood pressure elevated.  Continue Coreg  and resume amlodipine .  HLD: Continue home Atorvastatin    Polysubstance / cocaine use: Check UDS  Smoking: Nicotine  patch, gum prn; I have discussed tobacco cessation with the patient.  I have counseled the patient regarding the negative impacts of continued tobacco use including but not limited to lung cancer, COPD, and cardiovascular disease.  I have discussed alternatives to tobacco and modalities that may help facilitate tobacco cessation including but not limited to biofeedback, hypnosis, and medications.  Total time spent with tobacco counseling was 5 minutes.  Mood d/o: Continue home Buspar .   Chronic pain: Continue home Gabapentin , low-dose oxycodone  as needed for brief period of time/have approved 3 more doses if needed.  Abdominal pain and distention: Concern for ileus/SBO.  KUB ordered.  If negative, will order medications for constipation.  DVT prophylaxis: SCDs Start: 10/23/23 [phone]  Code Status: Full Code  Family Communication:  None present at bedside.   Status is: Inpatient Remains inpatient appropriate because: Needs dialysis and management of other medical issues as mentioned above.   Estimated body mass index is 16.78  kg/m as calculated from the following:   Height as of this encounter: 5' 7 (1.702 m).   Weight as of this encounter: 48.6 kg.    Nutritional Assessment: Body mass index is 16.78 kg/m.[name] Seen by dietician.  I agree with the assessment and plan as outlined below: Nutrition Status:        . Skin Assessment: I have examined the patient's skin and I agree with the wound assessment as performed by the wound care RN as outlined below:    Consultants:  Nephrology and GI  Procedures:  As above  Antimicrobials:  Anti-infectives (From admission, onward)    None         Subjective: Seen and examined.  Complains of headache, abdominal pain now.  Says that her last bowel movement was 3 days ago.  Also says that she does not think she is passing flatus.  She is not complaining of chest pain today.  No other complaint.  Objective: Vitals:   10/24/23 0339 10/24/23 0400 10/24/23 0500 10/24/23 0742  BP: (!) 163/64 (!) 154/67 (!) 162/69 (!) 163/104  Pulse: 88 80 82 89  Resp: 18 17 14 17   Temp: 98.3 F (36.8 C)   97.9 F (36.6 C)  TempSrc: Oral   Axillary  SpO2: 100% 100% 99% 100%  Weight:   48.6 kg   Height:        Intake/Output Summary (Last 24 hours) at 10/24/2023 0820 Last data filed at 10/24/2023 0340 Gross per 24 hour  Intake 970 ml  Output 3300 ml  Net -2330 ml   Filed Weights   10/22/23 2335 10/24/23 0500  Weight: 63.5 kg 48.6 kg    Examination:  General exam: Appears calm and comfortable  Respiratory system: Clear to auscultation. Respiratory effort normal. Cardiovascular system: S1 & S2 heard, RRR. No JVD, murmurs, rubs, gallops or clicks. No pedal edema. Gastrointestinal system: Abdomen is soft but guarding, distended with no tenderness. No organomegaly or masses felt. Normal bowel sounds heard. Central nervous system: Alert and oriented. No focal neurological deficits. Extremities: Symmetric 5 x 5 power. Skin: No rashes, lesions or ulcers.  Psychiatry:  Judgement and insight appear poor.  Data Reviewed: I have personally reviewed following labs and imaging studies  CBC: Recent Labs  Lab 10/22/23 2351 10/23/23 0001 10/23/23 1141 10/23/23 1807  WBC 6.2  --   --  6.3  NEUTROABS 4.1  --   --  5.1  HGB 5.3* 5.1* 9.2* 7.9*  HCT 17.1* 15.0* 26.3* 23.3*  MCV 97.2  --   --  88.9  PLT 293  --   --  324   Basic Metabolic Panel: Recent Labs  Lab 10/22/23 2351 10/23/23 0001  NA 130* 131*  K 4.2 4.4  CL 100  --   CO2 9*  --   GLUCOSE 84  --   BUN 92*  --   CREATININE 5.59*  --   CALCIUM  8.1*  --    GFR: Estimated Creatinine Clearance: 7.9 mL/min (A) (by C-G formula based on SCr of 5.59 mg/dL (H)). Liver Function Tests: Recent Labs  Lab 10/22/23 2351  AST 18  ALT 12  ALKPHOS 83  BILITOT 0.6  PROT 6.7  ALBUMIN  3.0*   No results for  input(s): LIPASE, AMYLASE in the last 168 hours. No results for input(s): AMMONIA in the last 168 hours. Coagulation Profile: No results for input(s): INR, PROTIME in the last 168 hours. Cardiac Enzymes: No results for input(s): CKTOTAL, CKMB, CKMBINDEX, TROPONINI in the last 168 hours. BNP (last 3 results) No results for input(s): PROBNP in the last 8760 hours. HbA1C: No results for input(s): HGBA1C in the last 72 hours. CBG: No results for input(s): GLUCAP in the last 168 hours. Lipid Profile: No results for input(s): CHOL, HDL, LDLCALC, TRIG, CHOLHDL, LDLDIRECT in the last 72 hours. Thyroid Function Tests: No results for input(s): TSH, T4TOTAL, FREET4, T3FREE, THYROIDAB in the last 72 hours. Anemia Panel: Recent Labs    10/23/23 1141  TIBC 305  IRON  256*   Sepsis Labs: Recent Labs  Lab 10/23/23 0859  LATICACIDVEN 0.8    Recent Results (from the past 240 hours)  Resp panel by RT-PCR (RSV, Flu A&B, Covid) Anterior Nasal Swab     Status: None   Collection Time: 10/22/23 11:51 PM   Specimen: Anterior Nasal Swab  Result Value Ref  Range Status   SARS Coronavirus 2 by RT PCR NEGATIVE NEGATIVE Final   Influenza A by PCR NEGATIVE NEGATIVE Final   Influenza B by PCR NEGATIVE NEGATIVE Final    Comment: (NOTE) The Xpert Xpress SARS-CoV-2/FLU/RSV plus assay is intended as an aid in the diagnosis of influenza from Nasopharyngeal swab specimens and should not be used as a sole basis for treatment. Nasal washings and aspirates are unacceptable for Xpert Xpress SARS-CoV-2/FLU/RSV testing.  Fact Sheet for Patients: BloggerCourse.com  Fact Sheet for Healthcare Providers: SeriousBroker.it  This test is not yet approved or cleared by the [place]  FDA and has been authorized for detection and/or diagnosis of SARS-CoV-2 by FDA under an Emergency Use Authorization (EUA). This EUA will remain in effect (meaning this test can be used) for the duration of the COVID-19 declaration under Section 564(b)(1) of the Act, 21 U.S.C. section 360bbb-3(b)(1), unless the authorization is terminated or revoked.     Resp Syncytial Virus by PCR NEGATIVE NEGATIVE Final    Comment: (NOTE) Fact Sheet for Patients: BloggerCourse.com  Fact Sheet for Healthcare Providers: SeriousBroker.it  This test is not yet approved or cleared by the [place]  FDA and has been authorized for detection and/or diagnosis of SARS-CoV-2 by FDA under an Emergency Use Authorization (EUA). This EUA will remain in effect (meaning this test can be used) for the duration of the COVID-19 declaration under Section 564(b)(1) of the Act, 21 U.S.C. section 360bbb-3(b)(1), unless the authorization is terminated or revoked.  Performed at [facility], [address], [place], [place] [address]   MRSA Next Gen by PCR, Nasal     Status: Abnormal   Collection Time: 10/23/23  4:23 PM   Specimen: Nasal Mucosa; Nasal Swab  Result Value Ref Range Status   MRSA by  PCR Next Gen DETECTED (A) NOT DETECTED Final    Comment: RESULT CALLED TO, READ BACK BY AND VERIFIED WITH: RN ROXIE HUNT [phone] AT 1826, ADC (NOTE) The GeneXpert MRSA Assay (FDA approved for NASAL specimens only), is one component of a comprehensive MRSA colonization surveillance program. It is not intended to diagnose MRSA infection nor to guide or monitor treatment for MRSA infections. Test performance is not FDA approved in patients less than 19 years old. Performed at [facility], [address] [place], [place] [address]      Radiology Studies: ECHOCARDIOGRAM  COMPLETE Result Date: 10/23/2023    ECHOCARDIOGRAM REPORT   Patient Name:   JAYNA MULNIX Held Date of Exam: 10/23/2023 Medical Rec #:  [id number]      Height:       67.0 in Accession #:    [id number]     Weight:       140.0 lb Date of Birth:  12/20/1960      BSA:          1.738 m Patient Age:    63 years       BP:           146/74 mmHg Patient Gender: F              HR:           93 bpm. Exam Location:  Inpatient Procedure: 2D Echo, Color Doppler and Cardiac Doppler (Both Spectral and Color            Flow Doppler were utilized during procedure). Indications:    CHF I50.9  History:        Patient has prior history of Echocardiogram examinations, most                 recent 01/25/2023.  Sonographer:    Tinnie Gosling RDCS Referring Phys: [id number] DORN DAWSON IMPRESSIONS  1. Left ventricular ejection fraction, by estimation, is 60 to 65%. The left ventricle has normal function. The left ventricle has no regional wall motion abnormalities. The left ventricular internal cavity size was mildly dilated. Left ventricular diastolic parameters are consistent with Grade II diastolic dysfunction (pseudonormalization). Elevated left atrial pressure.  2. Right ventricular systolic function is normal. The right ventricular size is normal. Tricuspid regurgitation signal is inadequate for assessing PA pressure.  3. Left atrial size was moderately  dilated.  4. The mitral valve is degenerative. Mild mitral valve regurgitation. No evidence of mitral stenosis. Moderate mitral annular calcification.  5. The aortic valve was not well visualized. Aortic valve regurgitation is not visualized. No aortic stenosis is present.  6. The inferior vena cava is normal in size with greater than 50% respiratory variability, suggesting right atrial pressure of 3 mmHg. FINDINGS  Left Ventricle: Left ventricular ejection fraction, by estimation, is 60 to 65%. The left ventricle has normal function. The left ventricle has no regional wall motion abnormalities. The left ventricular internal cavity size was mildly dilated. There is  no left ventricular hypertrophy. Left ventricular diastolic parameters are consistent with Grade II diastolic dysfunction (pseudonormalization). Elevated left atrial pressure. Right Ventricle: The right ventricular size is normal. No increase in right ventricular wall thickness. Right ventricular systolic function is normal. Tricuspid regurgitation signal is inadequate for assessing PA pressure. Left Atrium: Left atrial size was moderately dilated. Right Atrium: Right atrial size was normal in size. Pericardium: There is no evidence of pericardial effusion. Mitral Valve: The mitral valve is degenerative in appearance. There is mild thickening of the mitral valve leaflet(s). There is mild calcification of the mitral valve leaflet(s). Moderate mitral annular calcification. Mild mitral valve regurgitation. No evidence of mitral valve stenosis. MV peak gradient, 8.3 mmHg. The mean mitral valve gradient is 5.0 mmHg. Tricuspid Valve: The tricuspid valve is normal in structure. Tricuspid valve regurgitation is not demonstrated. No evidence of tricuspid stenosis. Aortic Valve: The aortic valve was not well visualized. Aortic valve regurgitation is not visualized. No aortic stenosis is present. Pulmonic Valve: The pulmonic valve was normal in structure. Pulmonic  valve regurgitation is not visualized. No evidence  of pulmonic stenosis. Aorta: The aortic root is normal in size and structure. Venous: The inferior vena cava is normal in size with greater than 50% respiratory variability, suggesting right atrial pressure of 3 mmHg. IAS/Shunts: No atrial level shunt detected by color flow Doppler.  LEFT VENTRICLE PLAX 2D LVIDd:         4.80 cm   Diastology LVIDs:         3.60 cm   LV e' medial:    7.77 cm/s LV PW:         0.90 cm   LV E/e' medial:  17.1 LV IVS:        0.90 cm   LV e' lateral:   11.30 cm/s LVOT diam:     2.00 cm   LV E/e' lateral: 11.8 LV SV:         76 LV SV Index:   44 LVOT Area:     3.14 cm  RIGHT VENTRICLE             IVC RV S prime:     14.90 cm/s  IVC diam: 1.80 cm TAPSE (M-mode): 2.4 cm LEFT ATRIUM             Index        RIGHT ATRIUM           Index LA diam:        3.40 cm 1.96 cm/m   RA Area:     10.60 cm LA Vol (A2C):   85.1 ml 48.97 ml/m  RA Volume:   20.90 ml  12.03 ml/m LA Vol (A4C):   54.8 ml 31.53 ml/m LA Biplane Vol: 69.6 ml 40.05 ml/m  AORTIC VALVE LVOT Vmax:   133.00 cm/s LVOT Vmean:  93.300 cm/s LVOT VTI:    0.242 m  AORTA Ao Root diam: 2.30 cm MITRAL VALVE MV Area (PHT): 6.32 cm     SHUNTS MV Area VTI:   2.90 cm     Systemic VTI:  0.24 m MV Peak grad:  8.3 mmHg     Systemic Diam: 2.00 cm MV Mean grad:  5.0 mmHg MV Vmax:       1.44 m/s MV Vmean:      109.0 cm/s MV Decel Time: 120 msec MV E velocity: 133.00 cm/s MV A velocity: 151.00 cm/s MV E/A ratio:  0.88 Wilbert Bihari MD Electronically signed by Wilbert Bihari MD Signature Date/Time: 10/23/2023/5:47:34 PM    Final    CT Angio Chest Pulmonary Embolism (PE) W or WO Contrast Result Date: 10/23/2023 CLINICAL DATA:  Pulmonary embolism (PE) suspected, high prob. EXAM: CT ANGIOGRAPHY CHEST WITH CONTRAST TECHNIQUE: Multidetector CT imaging of the chest was performed using the standard protocol during bolus administration of intravenous contrast. Multiplanar CT image reconstructions and MIPs  were obtained to evaluate the vascular anatomy. RADIATION DOSE REDUCTION: This exam was performed according to the departmental dose-optimization program which includes automated exposure control, adjustment of the mA and/or kV according to patient size and/or use of iterative reconstruction technique. CONTRAST:  75mL OMNIPAQUE  IOHEXOL  350 MG/ML SOLN COMPARISON:  CT angiography chest performed earlier the same day at 4:16 a.m. FINDINGS: There is satisfactory opacification of bilateral pulmonary arteries. There is no embolism to the proximal subsegmental pulmonary artery level. Multiple other findings such as cardiomegaly, bilateral pleural effusions - left more than right with associated compressive atelectatic changes, diffuse bronchial wall thickening/edema, findings favoring congestive heart failure/pulmonary edema, benign/reactive mediastinal lymph nodes, upper abdominal ascites, mild-to-moderate anasarca, diffuse  sclerosed bones, most likely due to renal osteodystrophy), etc. are essentially unchanged since the prior study from earlier the same day. There is interval clearing of previously noted linear filling defect in the right bronchus intermedius/lower lobe bronchus, suggesting interval clearing of mucus/secretions. Rest of the findings are essentially unchanged. Please refer to CTA chest report for details. Review of the MIP images confirms the above findings. IMPRESSION: 1. No embolism to the proximal subsegmental pulmonary artery level. 2. Interval clearing of previously noted linear filling defect in the right bronchus intermedius/lower lobe bronchus, suggesting interval clearing of mucus/secretions. 3. Multiple other findings are essentially unchanged since the prior study from earlier the same day. Please refer to CT Angiography chest report for details. Electronically Signed   By: Ree Molt M.D.   On: 10/23/2023 16:58   CT Angio Chest PE W and/or Wo Contrast Result Date: 10/23/2023 CLINICAL  DATA:  Homeless patient with end-stage renal failure, missed last 3 weeks of dialysis. Presents in respiratory distress. Concern for pulmonary embolism. EXAM: CT ANGIOGRAPHY CHEST WITH CONTRAST TECHNIQUE: Multidetector CT imaging of the chest was performed using the standard protocol during bolus administration of intravenous contrast. Multiplanar CT image reconstructions and MIPs were obtained to evaluate the vascular anatomy. RADIATION DOSE REDUCTION: This exam was performed according to the departmental dose-optimization program which includes automated exposure control, adjustment of the mA and/or kV according to patient size and/or use of iterative reconstruction technique. CONTRAST:  75mL OMNIPAQUE  IOHEXOL  350 MG/ML SOLN COMPARISON:  Chest CT without contrast 11/15/2021, abdomen and pelvis CT without contrast 07/23/2023. FINDINGS: Cardiovascular: There is panchamber moderate cardiomegaly, increased from 07/23/2023, with left chamber predominance and venous distension. There is no substantial pericardial effusion. There is patchy three-vessel coronary artery calcification. The pulmonary arteries are upper limit of normal in caliber. Respiratory motion and pleural effusions obscure the subsegmental arterial bed in the lower lobes but no arterial embolism is seen elsewhere. There is mild scattered calcific plaque in the aorta and great vessels without aneurysm, dissection or stenosis. Mediastinum/Nodes: There is a single prominent right mid hilar lymph node, 1 cm in short axis on 5:75. There are prominent lower right paratracheal and subcarinal lymph nodes both measuring 1.3 cm in short axis. There is no bulky, encasing or further adenopathy including the axillary and supraclavicular spaces. Negative thyroid gland, thoracic trachea and thoracic esophagus. Lungs/Pleura: There is mild interstitial edema in the lung apices and bases. There R increased moderate-sized left and small right layering pleural effusions.  Some of the right pleural fluid could be loculated laterally and anteriorly in the chest base. There is a mucoid septation in the distal right main bronchus and right lower lobe bronchus. In the lower lobes there is compressive atelectasis or consolidation alongside both pleural effusions. There is increased consolidation versus atelectasis in the lingular base, including compared with 07/23/2023. There is scattered linear scarring or atelectasis in the lower lung fields. There is diffuse bronchial thickening. No bronchial impaction is seen. Rest of the lungs appear clear. Upper Abdomen: Upper abdominal ascites, mild amount surrounding the liver and spleen and partially filling the lesser sac. There is aortic and branch vessel atherosclerosis. No free air. Musculoskeletal: Increased body wall anasarca over both prior studies. There is degenerative change and mild kyphosis thoracic spine. Dense bones consistent with renal osteodystrophy. No acute or other significant osseous findings. No mass in the visualized chest wall is seen. Review of the MIP images confirms the above findings. IMPRESSION: 1. No evidence of arterial embolus.  2. Cardiomegaly with left chamber predominance, venous distension, and mild interstitial edema. Findings consistent with CHF or fluid overload. 3. Increased moderate-sized left and small right layering pleural effusions. Some of the right pleural fluid could be loculated laterally and anteriorly in the chest base. 4. Compressive atelectasis or consolidation in the adjacent lower lobes. 5. Increased consolidation versus atelectasis in the lingular base. 6. Diffuse bronchial thickening. 7. Mucoid septation in the distal right main bronchus and right lower lobe bronchus. 8. Upper abdominal ascites. 9. Increased body wall anasarca. 10. Renal osteodystrophy. 11. Aortic and coronary artery atherosclerosis. Aortic Atherosclerosis (ICD10-I70.0). Electronically Signed   By: Francis Quam M.D.   On:  10/23/2023 05:03   DG Chest Port 1 View Result Date: 10/23/2023 CLINICAL DATA:  Resp distress EXAM: PORTABLE CHEST 1 VIEW COMPARISON:  Chest x-ray 10/05/2023 FINDINGS: Patient is rotated. The heart and mediastinal contours are unchanged. Bibasilar airspace opacities. Chronic coarsened interstitial markings with no overt pulmonary edema. Persistent bilateral pleural effusions trace to small volume pleural effusions. No pneumothorax. No acute osseous abnormality. IMPRESSION: Persistent bilateral pleural effusions trace to small volume pleural effusions. Electronically Signed   By: Morgane  Naveau M.D.   On: 10/23/2023 00:13    Scheduled Meds:  amLODipine   10 mg Oral Daily   atorvastatin   10 mg Oral Daily   budesonide -glycopyrrolate -formoterol   2 puff Inhalation BID   busPIRone   10 mg Oral TID   carvedilol   6.25 mg Oral BID WC   Chlorhexidine  Gluconate Cloth  6 each Topical Q0600   darbepoetin (ARANESP ) injection - DIALYSIS  100 mcg Subcutaneous Once   ferrous sulfate   325 mg Oral Q breakfast   gabapentin   100 mg Oral TID   nicotine   21 mg Transdermal Daily   pantoprazole  (PROTONIX ) IV  40 mg Intravenous BID   polyethylene glycol  17 g Oral Daily   sodium bicarbonate   1,300 mg Oral BID   sodium chloride  flush  3 mL Intravenous Q12H   Continuous Infusions:  furosemide  Stopped (10/23/23 1627)     LOS: 1 day   Fredia Skeeter, MD [facility]  10/24/2023, 8:20 AM  Total time spent: 45 minutes. *Please note that this is a verbal dictation therefore any spelling or grammatical errors are due to the Dragon Medical One system interpretation.  Please page via Amion and do not message via secure chat for urgent patient care matters. Secure chat can be used for non urgent patient care matters.  How to contact the [facility] Attending or Consulting provider 7A - 7P or covering provider during after hours 7P -7A, for this patient?  Check the care team in [facility] and look for a) attending/consulting [facility]  provider listed and b) the [facility] team listed. Page or secure chat 7A-7P. Log into [email or web address] and use [facility] universal password to access. If you do not have the password, please contact the hospital operator. Locate the [facility] provider you are looking for under [facility] and page to a number that you can be directly reached. If you still have difficulty reaching the provider, please page the [facility] (Director on Call) for the Hospitalists listed on amion for assistance.

## 2023-10-25 LAB — RENAL FUNCTION PANEL
Albumin: 2.2 g/dL — ABNORMAL LOW (ref 3.5–5.0)
Anion gap: 11 (ref 5–15)
BUN: 61 mg/dL — ABNORMAL HIGH (ref 8–23)
CO2: 15 mmol/L — ABNORMAL LOW (ref 22–32)
Calcium: 6.5 mg/dL — ABNORMAL LOW (ref 8.9–10.3)
Chloride: 107 mmol/L (ref 98–111)
Creatinine, Ser: 3.29 mg/dL — ABNORMAL HIGH (ref 0.44–1.00)
GFR, Estimated: 15 mL/min — ABNORMAL LOW (ref >60)
Glucose, Bld: 91 mg/dL (ref 70–99)
Phosphorus: 3.6 mg/dL (ref 2.5–4.6)
Potassium: 3 mmol/L — ABNORMAL LOW (ref 3.5–5.1)
Sodium: 133 mmol/L — ABNORMAL LOW (ref 135–145)

## 2023-10-25 LAB — CBC WITH DIFFERENTIAL/PLATELET
Abs Granulocyte: 3.8 K/uL (ref 1.5–6.5)
Abs Immature Granulocytes: 0.07 K/uL (ref 0.00–0.07)
Basophils Absolute: 0.1 K/uL (ref 0.0–0.1)
Basophils Relative: 1 %
Eosinophils Absolute: 0.3 K/uL (ref 0.0–0.5)
Eosinophils Relative: 6 %
HCT: 20.5 % — ABNORMAL LOW (ref 36.0–46.0)
Hemoglobin: 6.8 g/dL — CL (ref 12.0–15.0)
Immature Granulocytes: 1 %
Lymphocytes Relative: 19 %
Lymphs Abs: 1.1 K/uL (ref 0.7–4.0)
MCH: 30.4 pg (ref 26.0–34.0)
MCHC: 33.2 g/dL (ref 30.0–36.0)
MCV: 91.5 fL (ref 80.0–100.0)
Monocytes Absolute: 0.7 K/uL (ref 0.1–1.0)
Monocytes Relative: 11 %
Neutro Abs: 3.8 K/uL (ref 1.7–7.7)
Neutrophils Relative %: 62 %
Platelets: 304 K/uL (ref 150–400)
RBC: 2.24 MIL/uL — ABNORMAL LOW (ref 3.87–5.11)
RDW: 18.6 % — ABNORMAL HIGH (ref 11.5–15.5)
WBC: 6.1 K/uL (ref 4.0–10.5)
nRBC: 0 % (ref 0.0–0.2)

## 2023-10-25 LAB — HEMOGLOBIN AND HEMATOCRIT, BLOOD
HCT: 20.1 % — ABNORMAL LOW (ref 36.0–46.0)
HCT: 25.8 % — ABNORMAL LOW (ref 36.0–46.0)
Hemoglobin: 6.8 g/dL — CL (ref 12.0–15.0)
Hemoglobin: 8.8 g/dL — ABNORMAL LOW (ref 12.0–15.0)

## 2023-10-25 LAB — PREPARE RBC (CROSSMATCH)

## 2023-10-25 MED ORDER — PENTAFLUOROPROP-TETRAFLUOROETH EX AERO
1.0000 | INHALATION_SPRAY | CUTANEOUS | Status: DC | PRN
Start: 2023-10-25 — End: 2023-10-25

## 2023-10-25 MED ORDER — ANTICOAGULANT SODIUM CITRATE 4% (200MG/5ML) IV SOLN: 5.0000 mL | Status: DC | PRN

## 2023-10-25 MED ORDER — LIDOCAINE HCL (PF) 1 % IJ SOLN: 5.0000 mL | INTRAMUSCULAR | Status: DC | PRN

## 2023-10-25 MED ORDER — LIDOCAINE-PRILOCAINE 2.5-2.5 % EX CREA
1.0000 | TOPICAL_CREAM | CUTANEOUS | Status: DC | PRN
Start: 2023-10-25 — End: 2023-10-25

## 2023-10-25 MED ORDER — HEPARIN SODIUM (PORCINE) 1000 UNIT/ML DIALYSIS: 1000.0000 [IU] | INTRAMUSCULAR | Status: DC | PRN

## 2023-10-25 MED ORDER — SMOG ENEMA
960.0000 mL | Freq: Once | RECTAL | Status: AC
  Administered 2023-10-25: 960 mL via RECTAL
  Filled 2023-10-25: qty 960

## 2023-10-25 MED ORDER — BISACODYL 5 MG PO TBEC
15.0000 mg | DELAYED_RELEASE_TABLET | Freq: Once | ORAL | Status: AC
  Administered 2023-10-25: 15 mg via ORAL
  Filled 2023-10-25: qty 3

## 2023-10-25 MED ORDER — SODIUM CHLORIDE 0.9% IV SOLUTION: Freq: Once | INTRAVENOUS | Status: DC

## 2023-10-25 MED ORDER — ALTEPLASE 2 MG IJ SOLR: 2.0000 mg | Freq: Once | INTRAMUSCULAR | Status: DC | PRN

## 2023-10-25 NOTE — Progress Notes (Addendum)
 Received patient in bed to unit.  Alert and oriented.  Informed consent signed and in chart.   TX duration:2 hours and 35 minutes.  Patients cartridge clotted off with 25 minutes left, did not change per patient's request.  Dr. Marlee informed.  Patient tolerated well.  Transported back to the room  Alert, without acute distress.  Hand-off given to patient's nurse.   Access used: Right AV Fistula Upper Arm Access issues: arterial pressures set off machine due to cartridge clotting off.  Total UF removed: 2.3L Medication(s) given: 1 Unit of Blood  [name] LPN [facility]

## 2023-10-25 NOTE — H&P (View-Only) (Signed)
 Daily Progress Note  DOA: 10/22/2023 Hospital Day: 4   Patient Profile:   63 year old female with medical history of ESRD on HD , CHF, substance abuse, COPD, hypertension, untreated hepatitis C, chronic anemia, gastrointestinal AVMs, and adenomatous colon polyps.  See PMH for any additional medical history.  Admitted with recurrent acute on chronic anemia, dyspnea and volume overload. See 8/7 GI consult note    ASSESSMENT    Acute on chronic anemia AVM disease s/p multiple endoscopies with APC ( last one late June 2025) Presenting hemoglobin 5.3, down from 9.4 less than a month ago. Stools dark on iron  TODAY>> Hgb improved to 7.3 post 2 u RBCs but declined overnight to 6.8 ( repeated). Got another u RBCs this am   Chest pain  Reflux?  TODAY>>> She continues to complains of chest burning / sour taste     Constipation Had BM yesterday following dulcolax po and supp but says it wasn't much. No significant stool output in several days. KUB 8/8 showing large volume stool throughout colon. Abdominal distention probably combination of constipation and some ascites   COPD / chronic cough.  Today>> not coughing as much. Lung sound better    ESRD on HD but not adherent (homeless) Volume overload Dyspnea on admission  CTA shows bilateral pleural effusions , pulmonary edema, ascites, anasarca .     Prolonged QT   Principal Problem:   Volume overload Active Problems:   Acute on chronic anemia   CHF exacerbation (HCC)   ESRD on dialysis (HCC)   Angiodysplasia of upper gastrointestinal tract   PLAN   --Post transfusion H/H --Continue Pantoprazole  PO BID --Continue Pepcid  at bedtime --Short course of liquid carafate  TID before meals ( ESRD) --Continue BID MIralax  --Po Dulcolax 15 mg now --SMOG enema today --Ideally she could start on Subq Sandostatin as outpatient but multiple social barriers would likely preclude it.  -- Patient looks much better today.  She has been  ambulating, ate some of her lunch.  She is agreeable to proceeding with the enteroscopy tomorrow.  The risk and benefits of the procedure were already discussed at prior visits but reviewed again today.   GI Studies:    Recent Labs    10/24/23 0911 10/24/23 1752 10/25/23 0830 10/25/23 0906  WBC 6.1 7.4 6.1  --   HGB 7.3* 7.7* 6.8* 6.8*  HCT 20.9* 22.6* 20.5* 20.1*  MCV 88.2 90.8 91.5  --   PLT 286 289 304  --    Recent Labs    10/23/23 1141  TIBC 305  IRONPCTSAT 84*   Recent Labs    10/22/23 2351 10/23/23 0001 10/25/23 0830  NA 130* 131* 133*  K 4.2 4.4 3.0*  CL 100  --  107  CO2 9*  --  15*  GLUCOSE 84  --  91  BUN 92*  --  61*  CREATININE 5.59*  --  3.29*  CALCIUM  8.1*  --  6.5*   Recent Labs    10/22/23 2351 10/25/23 0830  PROT 6.7  --   ALBUMIN  3.0* 2.2*  AST 18  --   ALT 12  --   ALKPHOS 83  --   BILITOT 0.6  --      Imaging:  DG Abd 1 View EXAM: 1 VIEW XRAY OF THE ABDOMEN 10/24/2023 01:33:32 PM  COMPARISON: Abdominal radiograph dated 05/09/2023, CT abdomen and pelvis dated 07/23/2023.  CLINICAL HISTORY: [id number] SBO (small bowel obstruction) (HCC)  FINDINGS:  BOWEL: Nonobstructive bowel  gas pattern. Large volume stool throughout the colon.  SOFT TISSUES: No abnormal calcifications or opaque urinary calculi. Blunting of bilateral partially imaged costophrenic angles.  BONES: No acute osseous abnormality.  IMPRESSION: 1. No bowel obstruction. 2. Large volume stool throughout the colon. 3. Trace bilateral pleural effusions.  Electronically signed by: Limin Xu MD 10/24/2023 04:01 PM EDT RP Workstation: [id number]     Scheduled inpatient medications:   sodium chloride    Intravenous Once   amLODipine   10 mg Oral Daily   atorvastatin   10 mg Oral Daily   bisacodyl   10 mg Rectal Daily   budesonide -glycopyrrolate -formoterol   2 puff Inhalation BID   busPIRone   10 mg Oral TID   carvedilol   6.25 mg Oral BID WC   Chlorhexidine   Gluconate Cloth  6 each Topical Q0600   ferrous sulfate   325 mg Oral Q breakfast   gabapentin   100 mg Oral TID   nicotine   21 mg Transdermal Daily   pantoprazole  (PROTONIX ) IV  40 mg Intravenous BID   polyethylene glycol  17 g Oral Daily   sodium bicarbonate   1,300 mg Oral BID   sodium chloride  flush  3 mL Intravenous Q12H   sucralfate   1 g Oral TID AC   Continuous inpatient infusions:   famotidine  (PEPCID ) IV 20 mg (10/24/23 2211)   furosemide  160 mg (10/25/23 1427)   PRN inpatient medications: acetaminophen , ALPRAZolam , guaiFENesin , ipratropium-albuterol , nicotine  polacrilex, traZODone   Vital signs in last 24 hours: Temp:  [97.4 F (36.3 C)-98.4 F (36.9 C)] 98.1 F (36.7 C) (08/09 1526) Pulse Rate:  [73-87] 82 (08/09 1205) Resp:  [11-27] 18 (08/09 1205) BP: (125-184)/(56-131) 173/85 (08/09 1526) SpO2:  [94 %-100 %] 100 % (08/09 1205) Weight:  [48.6 kg-55.1 kg] 53.7 kg (08/09 1207) Last BM Date : 10/25/23  Intake/Output Summary (Last 24 hours) at 10/25/2023 1537 Last data filed at 10/25/2023 1143 Gross per 24 hour  Intake 483.33 ml  Output 2600 ml  Net -2116.67 ml    Intake/Output from previous day: 08/08 0701 - 08/09 0700 In: 120 [P.O.:120] Out: -  Intake/Output this shift: Total I/O In: 363.3 [Blood:363.3] Out: 2600 [Urine:300; Other:2300]   Physical Exam:  General: Alert female in NAD Heart:  Regular rate and rhythm.  Pulmonary: Normal respiratory effort Abdomen: Soft, moderately distended, mild generalized tenderness, tympanitic, bulging flanks.  Normal bowel sounds. Neurologic: Alert and oriented Psych: Pleasant. Cooperative     LOS: 2 days   Vina Dasen ,NP 10/25/2023, 3:37 PM

## 2023-10-25 NOTE — Evaluation (Signed)
 Physical Therapy Evaluation Patient Details Name: [name] MRN: [id number] DOB: [birth year] Today's Date: 10/25/2023  History of Present Illness  Pt is 63 yo presenting to [facility] on 8/6 due to respiratory distress. Pt was recently admitted 7/5-7/12 due to volume overload. Pt returned to ED 7/20 for HD and did not receive again until returning to ED on 8/6 . PMHx: ESRD with noncompliance with HD, T2DM, COPD, PAD, HTN, GERD, GIB, AVM, CHF, anemia, polysubstance abuse, HLD, hep C, tobacco use, homelessness.  Clinical Impression  Pt is presenting slightly below baseline level of functioning.  Pt was intermittently using a SPC, currently using IV pole at [facility] with kyphotic posture. Pt demonstrates balance deficits which were not formally assessed. Pt is unsure if she will be able to retrieve her [facility] on discharge from acute care setting. Supervision for sit to stand without an AD and CGA for gait with IV pole support. Due to pt current functional status, home set up and available assistance at home no recommended skilled physical therapy services at this time on discharge from acute care hospital setting. Will continue to follow in acute setting in order to ensure that pt returns home with decreased risk for falls, injury, re-hospitalization and improved activity tolerance.        If plan is discharge home, recommend the following: Other (comment) (as needed)     Equipment Recommendations Cane     Functional Status Assessment Patient has had a recent decline in their functional status and demonstrates the ability to make significant improvements in function in a reasonable and predictable amount of time.     Precautions / Restrictions Precautions Precautions: Fall Recall of Precautions/Restrictions: Intact Restrictions Weight Bearing Restrictions Per Provider Order: No      Mobility  Bed Mobility Overal bed mobility: Independent      Transfers Overall transfer level: Needs  assistance Equipment used: None Transfers: Sit to/from Stand, Bed to chair/wheelchair/BSC Sit to Stand: Supervision   Step pivot transfers: Supervision       General transfer comment: for balance from EOB and toilet    Ambulation/Gait Ambulation/Gait assistance: Contact guard assist Gait Distance (Feet): 120 Feet Assistive device: IV Pole Gait Pattern/deviations: Step-through pattern, Narrow base of support, Trunk flexed Gait velocity: decreased Gait velocity interpretation: 1.31 - 2.62 ft/sec, indicative of limited community ambulator   General Gait Details: Pt has minimal support on IV pole, flexed posture with uneven step length     Balance Overall balance assessment: Mild deficits observed, not formally tested         Pertinent Vitals/Pain Pain Assessment Pain Assessment: Faces Faces Pain Scale: No hurt Pain Intervention(s): Monitored during session    Home Living Family/patient expects to be discharged to:: Shelter/Homeless     Additional Comments: unclear what her DC plan/location    Prior Function Prior Level of Function : Independent/Modified Independent       Mobility Comments: Ambulatory without AD, occasional use of SPC, denies falls ADLs Comments: independent     Extremity/Trunk Assessment   Upper Extremity Assessment Upper Extremity Assessment: Overall WFL for tasks assessed    Lower Extremity Assessment Lower Extremity Assessment: Overall WFL for tasks assessed    Cervical / Trunk Assessment Cervical / Trunk Assessment: Kyphotic  Communication   Communication Communication: No apparent difficulties    Cognition Arousal: Alert Behavior During Therapy: WFL for tasks assessed/performed   PT - Cognitive impairments: No apparent impairments   Following commands: Intact  Cueing Cueing Techniques: Verbal cues     General Comments General comments (skin integrity, edema, etc.): O2 sats and HR remained WNL on room air         Assessment/Plan    PT Assessment Patient needs continued PT services  PT Problem List Decreased strength;Decreased activity tolerance;Decreased balance;Decreased mobility;Decreased safety awareness       PT Treatment Interventions DME instruction;Balance training;Gait training;Stair training;Functional mobility training;Therapeutic activities;Therapeutic exercise;Patient/family education    PT Goals (Current goals can be found in the Care Plan section)  Acute Rehab PT Goals Patient Stated Goal: to get a cane PT Goal Formulation: With patient Time For Goal Achievement: 11/08/23 Potential to Achieve Goals: Good    Frequency Min 1X/week        AM-PAC PT 6 Clicks Mobility  Outcome Measure Help needed turning from your back to your side while in a flat bed without using bedrails?: None Help needed moving from lying on your back to sitting on the side of a flat bed without using bedrails?: None Help needed moving to and from a bed to a chair (including a wheelchair)?: A Little Help needed standing up from a chair using your arms (e.g., wheelchair or bedside chair)?: A Little Help needed to walk in hospital room?: A Little Help needed climbing 3-5 steps with a railing? : A Little 6 Click Score: 20    End of Session Equipment Utilized During Treatment: Gait belt Activity Tolerance: Patient tolerated treatment well Patient left: in bed;with call bell/phone within reach Nurse Communication: Mobility status PT Visit Diagnosis: Unsteadiness on feet (R26.81);Other abnormalities of gait and mobility (R26.89)    Time: 8542-8475 PT Time Calculation (min) (ACUTE ONLY): 27 min   Charges:   PT Evaluation $PT Eval Low Complexity: 1 Low PT Treatments $Therapeutic Activity: 8-22 mins PT General Charges $$ ACUTE PT VISIT: 1 Visit        Dorothyann Maier, DPT, CLT  Acute Rehabilitation Services Office: [phone] (Secure chat preferred)   Dorothyann VEAR Maier 10/25/2023, 3:43  PM

## 2023-10-25 NOTE — Progress Notes (Signed)
   10/25/23 1143  Vitals  Temp 98.1 F (36.7 C)  Temp Source Oral  BP (!) 159/71  Pulse Rate 81  ECG Heart Rate 82  Resp 20  Oxygen  Therapy  SpO2 99 %  O2 Device Room Air  During Treatment Monitoring  Duration of HD Treatment -hour(s) 2.46 hour(s)  HD Safety Checks Performed Yes  Intra-Hemodialysis Comments See progress note (Cartridge clotted off with 25 minutes left on machine.)  Post Treatment  Dialyzer Clearance Clear  Liters Processed 40.5  Fluid Removed (mL) 2300 mL  Tolerated HD Treatment Yes  AVG/AVF Arterial Site Held (minutes) 7 minutes  AVG/AVF Venous Site Held (minutes) 7 minutes  Fistula / Graft Right Upper arm Arteriovenous fistula  Placement Date/Time: 08/14/22 1230   Orientation: Right  Access Location: (c) Upper arm  Access Type: Arteriovenous fistula  Status Deaccessed

## 2023-10-25 NOTE — Progress Notes (Signed)
 Daily Progress Note  DOA: 10/22/2023 Hospital Day: 4   Patient Profile:   63 year old female with medical history of ESRD on HD , CHF, substance abuse, COPD, hypertension, untreated hepatitis C, chronic anemia, gastrointestinal AVMs, and adenomatous colon polyps.  See PMH for any additional medical history.  Admitted with recurrent acute on chronic anemia, dyspnea and volume overload. See 8/7 GI consult note    ASSESSMENT    Acute on chronic anemia AVM disease s/p multiple endoscopies with APC ( last one late June 2025) Presenting hemoglobin 5.3, down from 9.4 less than a month ago. Stools dark on iron  TODAY>> Hgb improved to 7.3 post 2 u RBCs but declined overnight to 6.8 ( repeated). Got another u RBCs this am   Chest pain  Reflux?  TODAY>>> She continues to complains of chest burning / sour taste     Constipation Had BM yesterday following dulcolax po and supp but says it wasn't much. No significant stool output in several days. KUB 8/8 showing large volume stool throughout colon. Abdominal distention probably combination of constipation and some ascites   COPD / chronic cough.  Today>> not coughing as much. Lung sound better    ESRD on HD but not adherent (homeless) Volume overload Dyspnea on admission  CTA shows bilateral pleural effusions , pulmonary edema, ascites, anasarca .     Prolonged QT   Principal Problem:   Volume overload Active Problems:   Acute on chronic anemia   CHF exacerbation (HCC)   ESRD on dialysis (HCC)   Angiodysplasia of upper gastrointestinal tract   PLAN   --Post transfusion H/H --Continue Pantoprazole  PO BID --Continue Pepcid  at bedtime --Short course of liquid carafate  TID before meals ( ESRD) --Continue BID MIralax  --Po Dulcolax 15 mg now --SMOG enema today --Ideally she could start on Subq Sandostatin as outpatient but multiple social barriers would likely preclude it.  -- Patient looks much better today.  She has been  ambulating, ate some of her lunch.  She is agreeable to proceeding with the enteroscopy tomorrow.  The risk and benefits of the procedure were already discussed at prior visits but reviewed again today.   GI Studies:    Recent Labs    10/24/23 0911 10/24/23 1752 10/25/23 0830 10/25/23 0906  WBC 6.1 7.4 6.1  --   HGB 7.3* 7.7* 6.8* 6.8*  HCT 20.9* 22.6* 20.5* 20.1*  MCV 88.2 90.8 91.5  --   PLT 286 289 304  --    Recent Labs    10/23/23 1141  TIBC 305  IRONPCTSAT 84*   Recent Labs    10/22/23 2351 10/23/23 0001 10/25/23 0830  NA 130* 131* 133*  K 4.2 4.4 3.0*  CL 100  --  107  CO2 9*  --  15*  GLUCOSE 84  --  91  BUN 92*  --  61*  CREATININE 5.59*  --  3.29*  CALCIUM  8.1*  --  6.5*   Recent Labs    10/22/23 2351 10/25/23 0830  PROT 6.7  --   ALBUMIN  3.0* 2.2*  AST 18  --   ALT 12  --   ALKPHOS 83  --   BILITOT 0.6  --      Imaging:  DG Abd 1 View EXAM: 1 VIEW XRAY OF THE ABDOMEN 10/24/2023 01:33:32 PM  COMPARISON: Abdominal radiograph dated 05/09/2023, CT abdomen and pelvis dated 07/23/2023.  CLINICAL HISTORY: [id number] SBO (small bowel obstruction) (HCC)  FINDINGS:  BOWEL: Nonobstructive bowel  gas pattern. Large volume stool throughout the colon.  SOFT TISSUES: No abnormal calcifications or opaque urinary calculi. Blunting of bilateral partially imaged costophrenic angles.  BONES: No acute osseous abnormality.  IMPRESSION: 1. No bowel obstruction. 2. Large volume stool throughout the colon. 3. Trace bilateral pleural effusions.  Electronically signed by: Limin Xu MD 10/24/2023 04:01 PM EDT RP Workstation: [id number]     Scheduled inpatient medications:   sodium chloride    Intravenous Once   amLODipine   10 mg Oral Daily   atorvastatin   10 mg Oral Daily   bisacodyl   10 mg Rectal Daily   budesonide -glycopyrrolate -formoterol   2 puff Inhalation BID   busPIRone   10 mg Oral TID   carvedilol   6.25 mg Oral BID WC   Chlorhexidine   Gluconate Cloth  6 each Topical Q0600   ferrous sulfate   325 mg Oral Q breakfast   gabapentin   100 mg Oral TID   nicotine   21 mg Transdermal Daily   pantoprazole  (PROTONIX ) IV  40 mg Intravenous BID   polyethylene glycol  17 g Oral Daily   sodium bicarbonate   1,300 mg Oral BID   sodium chloride  flush  3 mL Intravenous Q12H   sucralfate   1 g Oral TID AC   Continuous inpatient infusions:   famotidine  (PEPCID ) IV 20 mg (10/24/23 2211)   furosemide  160 mg (10/25/23 1427)   PRN inpatient medications: acetaminophen , ALPRAZolam , guaiFENesin , ipratropium-albuterol , nicotine  polacrilex, traZODone   Vital signs in last 24 hours: Temp:  [97.4 F (36.3 C)-98.4 F (36.9 C)] 98.1 F (36.7 C) (08/09 1526) Pulse Rate:  [73-87] 82 (08/09 1205) Resp:  [11-27] 18 (08/09 1205) BP: (125-184)/(56-131) 173/85 (08/09 1526) SpO2:  [94 %-100 %] 100 % (08/09 1205) Weight:  [48.6 kg-55.1 kg] 53.7 kg (08/09 1207) Last BM Date : 10/25/23  Intake/Output Summary (Last 24 hours) at 10/25/2023 1537 Last data filed at 10/25/2023 1143 Gross per 24 hour  Intake 483.33 ml  Output 2600 ml  Net -2116.67 ml    Intake/Output from previous day: 08/08 0701 - 08/09 0700 In: 120 [P.O.:120] Out: -  Intake/Output this shift: Total I/O In: 363.3 [Blood:363.3] Out: 2600 [Urine:300; Other:2300]   Physical Exam:  General: Alert female in NAD Heart:  Regular rate and rhythm.  Pulmonary: Normal respiratory effort Abdomen: Soft, moderately distended, mild generalized tenderness, tympanitic, bulging flanks.  Normal bowel sounds. Neurologic: Alert and oriented Psych: Pleasant. Cooperative     LOS: 2 days   Vina Dasen ,NP 10/25/2023, 3:37 PM

## 2023-10-25 NOTE — Progress Notes (Signed)
 [facility]                                                                               Yarlin Hocutt, is a 63 y.o. female, DOB - 1960-06-23, FMW:[id number] Admit date - 10/22/2023    Outpatient Primary MD for the patient is Pcp, No  LOS - 2  days    Brief summary   [name] is a 63 y.o. 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  Did not receive any HD until she returned to the ED on 10/22/2023 with respiratory distress.   She has a chronic cough which is nonproductive no recent fevers. Has been out of medications over past few weeks. She does not live near the hospital and so getting here for dialysis is a struggle, needing to hitch rides etc. She is upset about her illness progressing and she says she's more open to social work resources. Still smoking 1 PPD cigarettes and she snorts cocaine which she acknowledges ongoing use, no crack use.   Assessment & Plan    Assessment and Plan:   Acute hypoxic respiratory failure secondary to volume overload/acute on chronic congestive heart failure with preserved ejection fraction, persistent bilateral pleural effusion and anemia/ESRD on HD, POA: Patient was placed on BiPAP upon arrival due to hypoxia.  Over the course of few hours, she improved significantly and was weaned to room air.   Fluid management as per HD and IV lasix  160 mg BID.  Continue with strict intake and output.     Acute on chronic anemia/acute blood loss anemia/symptomatic anemia/history of AVM: Patient has a history of AVM, hemoglobin 2 weeks ago was 9.4, she presented with a 5.1.   Received 2 units of PRBC transfusion, repeat posttransfusion hemoglobin 9.2 yesterday which then dropped to 7.9, morning labs are pending.  GI saw patient, plan for small bowel enteroscopy in am.  Another 1 unit of prbc transfusion ordered for a hemoglobin of 6.8.  Repeat H&H in am.     ACS ruled out/demand ischemia ruled in: Troponins were checked which were in 20s and flat indicative of demand ischemia.  No chest pain.  Elevated D-dimer but CTA negative for PE.   Hyponatremia, mild and chronic, likely from hypervolemia.   Qtc prolongation - 511 -- Avoid QT prolonging meds    Social determinants: Homelessness, TOC consulted.   COPD/chronic cough:  no wheezing heard.  Nebs prn, continue home Breztri .  Robitussin as needed.   Hypertension:  Not well controlled. Continue with norvasc  10 mg daily, coreg  6.25 mg BID, hydralazine  prn.    HLD: Continue home Atorvastatin     Polysubstance / cocaine use: Check UDS   Smoking: Nicotine  patch, gum prn; I have discussed tobacco cessation with the patient.  I have counseled the  patient regarding the negative impacts of continued tobacco use including but not limited to lung cancer, COPD, and cardiovascular disease.  I have discussed alternatives to tobacco and modalities that may help facilitate tobacco cessation including but not limited to biofeedback, hypnosis, and medications.  Total time spent with tobacco counseling was 5 minutes.   Mood d/o: Continue home Buspar .    Chronic pain: Continue home Gabapentin , low-dose oxycodone  as needed for brief period of time/have approved 3 more doses if needed.   Abdominal pain and distention: ABD xray showed moderate stool burden. Stool softeners.     Hypokalemia   Hyponatremia Sodium of 133.   Estimated body mass index is 18.54 kg/m as calculated from the following:   Height as of this encounter: 5' 7 (1.702 m).   Weight as of this encounter: 53.7 kg.  Code Status: full code.  DVT  Prophylaxis:  SCDs Start: 10/23/23 0648   Level of Care: Level of care: Progressive Family Communication: none at bedside.   Disposition Plan:     Remains inpatient appropriate:  pending.   Procedures:  HD.    Consultants:   Gastroenterology.   Antimicrobials:   Anti-infectives (From admission, onward)    None        Medications  Scheduled Meds:  sodium chloride    Intravenous Once   amLODipine   10 mg Oral Daily   atorvastatin   10 mg Oral Daily   bisacodyl   10 mg Rectal Daily   budesonide -glycopyrrolate -formoterol   2 puff Inhalation BID   busPIRone   10 mg Oral TID   carvedilol   6.25 mg Oral BID WC   Chlorhexidine  Gluconate Cloth  6 each Topical Q0600   ferrous sulfate   325 mg Oral Q breakfast   gabapentin   100 mg Oral TID   nicotine   21 mg Transdermal Daily   pantoprazole  (PROTONIX ) IV  40 mg Intravenous BID   polyethylene glycol  17 g Oral Daily   sodium bicarbonate   1,300 mg Oral BID   sodium chloride  flush  3 mL Intravenous Q12H   SMOG  960 mL Rectal Once   sucralfate   1 g Oral TID AC   Continuous Infusions:  famotidine  (PEPCID ) IV 20 mg (10/24/23 2211)   furosemide  160 mg (10/25/23 1427)   PRN Meds:.acetaminophen , ALPRAZolam , guaiFENesin , ipratropium-albuterol , nicotine  polacrilex, traZODone     Subjective:   [name] was seen and examined today.  Not feeling great.   Objective:   Vitals:   10/25/23 1200 10/25/23 1205 10/25/23 1207 10/25/23 1526  BP: (!) 153/131 (!) 162/84  (!) 173/85  Pulse: 81 82  98  Resp: 13 18  18   Temp:    98.1 F (36.7 C)  TempSrc:    Oral  SpO2: 100% 100%  100%  Weight:   53.7 kg   Height:        Intake/Output Summary (Last 24 hours) at 10/25/2023 1654 Last data filed at 10/25/2023 1143 Gross per 24 hour  Intake 483.33 ml  Output 2600 ml  Net -2116.67 ml   Filed Weights   10/25/23 0500 10/25/23 0814 10/25/23 1207  Weight: 48.6 kg 55.1 kg 53.7 kg     Exam  General exam: Appears calm and comfortable   Respiratory system: Clear to auscultation. Respiratory effort normal. Cardiovascular system: S1 & S2 heard, RRR. No JVD, Gastrointestinal system: Abdomen is nondistended, soft and nontender.  Central nervous system: Alert and oriented.  Extremities: Symmetric 5 x 5 power. Skin: No rashes, Psychiatry:  Mood & affect appropriate.  Data Reviewed:  I have personally reviewed following labs and imaging studies   CBC Lab Results  Component Value Date   WBC 6.1 10/25/2023   RBC 2.24 (L) 10/25/2023   HGB 6.8 (LL) 10/25/2023   HCT 20.1 (L) 10/25/2023   MCV 91.5 10/25/2023   MCH 30.4 10/25/2023   PLT 304 10/25/2023   MCHC 33.2 10/25/2023   RDW 18.6 (H) 10/25/2023   LYMPHSABS 1.1 10/25/2023   MONOABS 0.7 10/25/2023   EOSABS 0.3 10/25/2023   BASOSABS 0.1 10/25/2023     Last metabolic panel Lab Results  Component Value Date   NA 133 (L) 10/25/2023   K 3.0 (L) 10/25/2023   CL 107 10/25/2023   CO2 15 (L) 10/25/2023   BUN 61 (H) 10/25/2023   CREATININE 3.29 (H) 10/25/2023   GLUCOSE 91 10/25/2023   GFRNONAA 15 (L) 10/25/2023   GFRAA 36 (L) 08/31/2019   CALCIUM  6.5 (L) 10/25/2023   PHOS 3.6 10/25/2023   PROT 6.7 10/22/2023   ALBUMIN  2.2 (L) 10/25/2023   LABGLOB 3.7 06/06/2022   AGRATIO 1.1 (L) 06/06/2022   BILITOT 0.6 10/22/2023   ALKPHOS 83 10/22/2023   AST 18 10/22/2023   ALT 12 10/22/2023   ANIONGAP 11 10/25/2023    CBG (last 3)  No results for input(s): GLUCAP in the last 72 hours.    Coagulation Profile: No results for input(s): INR, PROTIME in the last 168 hours.   Radiology Studies: DG Abd 1 View Result Date: 10/24/2023 EXAM: 1 VIEW XRAY OF THE ABDOMEN 10/24/2023 01:33:32 PM COMPARISON: Abdominal radiograph dated 05/09/2023, CT abdomen and pelvis dated 07/23/2023. CLINICAL HISTORY: [id number] SBO (small bowel obstruction) (HCC) FINDINGS: BOWEL: Nonobstructive bowel gas pattern. Large volume stool throughout the colon. SOFT TISSUES: No abnormal  calcifications or opaque urinary calculi. Blunting of bilateral partially imaged costophrenic angles. BONES: No acute osseous abnormality. IMPRESSION: 1. No bowel obstruction. 2. Large volume stool throughout the colon. 3. Trace bilateral pleural effusions. Electronically signed by: Manford Cummins MD 10/24/2023 04:01 PM EDT RP Workstation: [id number]       Elgie Butter M.D. [facility] 10/25/2023, 4:54 PM  Available via Epic secure chat 7am-7pm After 7 pm, please refer to night coverage provider listed on amion.

## 2023-10-25 NOTE — Progress Notes (Signed)
 Admit: 10/22/2023 LOS: 2  64F ESRD here with progressive dysnpea, hypoxia, and AoC anemia.   Subjective:  Eating breakfast, in good spirits For dialysis today  08/08 0701 - 08/09 0700 In: 120 [P.O.:120] Out: -   Filed Weights   10/22/23 2335 10/24/23 0500 10/25/23 0500  Weight: 63.5 kg 48.6 kg 48.6 kg    Scheduled Meds:  amLODipine   10 mg Oral Daily   atorvastatin   10 mg Oral Daily   bisacodyl   10 mg Rectal Daily   budesonide -glycopyrrolate -formoterol   2 puff Inhalation BID   busPIRone   10 mg Oral TID   carvedilol   6.25 mg Oral BID WC   Chlorhexidine  Gluconate Cloth  6 each Topical Q0600   ferrous sulfate   325 mg Oral Q breakfast   gabapentin   100 mg Oral TID   nicotine   21 mg Transdermal Daily   pantoprazole  (PROTONIX ) IV  40 mg Intravenous BID   polyethylene glycol  17 g Oral Daily   sodium bicarbonate   1,300 mg Oral BID   sodium chloride  flush  3 mL Intravenous Q12H   sucralfate   1 g Oral TID AC   Continuous Infusions:  anticoagulant sodium citrate      famotidine  (PEPCID ) IV 20 mg (10/24/23 2211)   furosemide  160 mg (10/24/23 1333)   PRN Meds:.acetaminophen , ALPRAZolam , alteplase , anticoagulant sodium citrate , guaiFENesin , heparin , ipratropium-albuterol , lidocaine  (PF), lidocaine -prilocaine , nicotine  polacrilex, oxyCODONE , pentafluoroprop-tetrafluoroeth, traZODone   Current Labs: reviewed    Physical Exam:  Blood pressure (!) 152/68, pulse 82, temperature 97.7 F (36.5 C), temperature source Oral, resp. rate 18, height 5' 7 (1.702 m), weight 48.6 kg, SpO2 98%. GEN: chronically ill appearing  ENT: NCAT EYES: EOMI CV: regular, no rub PULM: nl WOB. CTAB, diminished in bases ABD: s/nt/n SKIN: no rashes/lesions EXT: No sig LEE VASC: RUE AVF  +b/t  A ESRD: some residual RRF. Uses RUE AVF.  HD cont on THS schedule at this time.  Nonadherent and comes to hospital for HD.  Not historically a candidate for outpt CLIP AHRF / DYspnea: from #3 and hypervolemia: Cont UF  as able; now on room air AoC Anemia, hx/o GIB for GI eval; ESA qThursday given 8/7.  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 Morphine  should not be used.  Baclofen should be avoided Avoid oral sodium phosphate  and magnesium  citrate based laxatives / bowel preps    Bernardino Gasman MD 10/25/2023, 7:50 AM  Recent Labs  Lab 10/22/23 2351 10/23/23 0001  NA 130* 131*  K 4.2 4.4  CL 100  --   CO2 9*  --   GLUCOSE 84  --   BUN 92*  --   CREATININE 5.59*  --   CALCIUM  8.1*  --    Recent Labs  Lab 10/23/23 1807 10/24/23 0911 10/24/23 1752  WBC 6.3 6.1 7.4  NEUTROABS 5.1 4.5 5.0  HGB 7.9* 7.3* 7.7*  HCT 23.3* 20.9* 22.6*  MCV 88.9 88.2 90.8  PLT 324 286 289

## 2023-10-25 NOTE — Progress Notes (Signed)
 Date and time results received: 10/25/23 @9AM  (use smartphrase .now to insert current time)  Test: Hemoglobin Critical Value: 6.8  Name of Provider Notified: Dr. Cherlyn  Orders Received? Or Actions Taken?: Blood 1 unit ordered

## 2023-10-26 ENCOUNTER — Encounter (HOSPITAL_COMMUNITY): Payer: Self-pay | Admitting: Internal Medicine

## 2023-10-26 ENCOUNTER — Inpatient Hospital Stay (HOSPITAL_COMMUNITY): Admitting: Anesthesiology

## 2023-10-26 ENCOUNTER — Encounter (HOSPITAL_COMMUNITY)
Admission: EM | Disposition: A | Discharge status: Home / Self Care | Payer: Self-pay | Source: Home / Self Care | Attending: Internal Medicine

## 2023-10-26 DIAGNOSIS — I132 Hypertensive heart and chronic kidney disease with heart failure and with stage 5 chronic kidney disease, or end stage renal disease: Secondary | ICD-10-CM

## 2023-10-26 DIAGNOSIS — K31811 Angiodysplasia of stomach and duodenum with bleeding: Secondary | ICD-10-CM

## 2023-10-26 DIAGNOSIS — N186 End stage renal disease: Secondary | ICD-10-CM

## 2023-10-26 DIAGNOSIS — K2100 Gastro-esophageal reflux disease with esophagitis, without bleeding: Secondary | ICD-10-CM

## 2023-10-26 DIAGNOSIS — I5032 Chronic diastolic (congestive) heart failure: Secondary | ICD-10-CM

## 2023-10-26 DIAGNOSIS — K3189 Other diseases of stomach and duodenum: Secondary | ICD-10-CM

## 2023-10-26 HISTORY — PX: ENTEROSCOPY: SHX5533

## 2023-10-26 LAB — BASIC METABOLIC PANEL WITH GFR
Anion gap: 11 (ref 5–15)
BUN: 46 mg/dL — ABNORMAL HIGH (ref 8–23)
CO2: 24 mmol/L (ref 22–32)
Calcium: 8.1 mg/dL — ABNORMAL LOW (ref 8.9–10.3)
Chloride: 97 mmol/L — ABNORMAL LOW (ref 98–111)
Creatinine, Ser: 3.36 mg/dL — ABNORMAL HIGH (ref 0.44–1.00)
GFR, Estimated: 15 mL/min — ABNORMAL LOW (ref >60)
Glucose, Bld: 107 mg/dL — ABNORMAL HIGH (ref 70–99)
Potassium: 3.7 mmol/L (ref 3.5–5.1)
Sodium: 132 mmol/L — ABNORMAL LOW (ref 135–145)

## 2023-10-26 LAB — TYPE AND SCREEN
ABO/RH(D): A POS
Antibody Screen: NEGATIVE
Unit division: 0
Unit division: 0
Unit division: 0

## 2023-10-26 LAB — CBC WITH DIFFERENTIAL/PLATELET
Abs Immature Granulocytes: 0.04 K/uL (ref 0.00–0.07)
Basophils Absolute: 0.1 K/uL (ref 0.0–0.1)
Basophils Relative: 1 %
Eosinophils Absolute: 0.4 K/uL (ref 0.0–0.5)
Eosinophils Relative: 5 %
HCT: 25.6 % — ABNORMAL LOW (ref 36.0–46.0)
Hemoglobin: 8.7 g/dL — ABNORMAL LOW (ref 12.0–15.0)
Immature Granulocytes: 1 %
Lymphocytes Relative: 16 %
Lymphs Abs: 1.2 K/uL (ref 0.7–4.0)
MCH: 29.8 pg (ref 26.0–34.0)
MCHC: 34 g/dL (ref 30.0–36.0)
MCV: 87.7 fL (ref 80.0–100.0)
Monocytes Absolute: 0.9 K/uL (ref 0.1–1.0)
Monocytes Relative: 12 %
Neutro Abs: 4.7 K/uL (ref 1.7–7.7)
Neutrophils Relative %: 65 %
Platelets: 281 K/uL (ref 150–400)
RBC: 2.92 MIL/uL — ABNORMAL LOW (ref 3.87–5.11)
RDW: 19.3 % — ABNORMAL HIGH (ref 11.5–15.5)
WBC: 7.1 K/uL (ref 4.0–10.5)
nRBC: 0 % (ref 0.0–0.2)

## 2023-10-26 LAB — BPAM RBC
Blood Product Expiration Date: 202508132359
Blood Product Expiration Date: 202508132359
Blood Product Expiration Date: 202508312359
ISSUE DATE / TIME: 202508070119
ISSUE DATE / TIME: 202508070119
ISSUE DATE / TIME: 202508090959
Unit Type and Rh: 6200
Unit Type and Rh: 6200
Unit Type and Rh: 6200

## 2023-10-26 LAB — GLUCOSE, CAPILLARY
Glucose-Capillary: 101 mg/dL — ABNORMAL HIGH (ref 70–99)
Glucose-Capillary: 106 mg/dL — ABNORMAL HIGH (ref 70–99)

## 2023-10-26 SURGERY — ENTEROSCOPY
Anesthesia: Monitor Anesthesia Care

## 2023-10-26 MED ORDER — SODIUM CHLORIDE 0.9 % IV SOLN: INTRAVENOUS | Status: DC | PRN

## 2023-10-26 MED ORDER — LIDOCAINE 2% (20 MG/ML) 5 ML SYRINGE
INTRAMUSCULAR | Status: DC | PRN
  Administered 2023-10-26: 40 mg via INTRAVENOUS

## 2023-10-26 MED ORDER — PANTOPRAZOLE SODIUM 40 MG PO TBEC
40.0000 mg | DELAYED_RELEASE_TABLET | Freq: Two times a day (BID) | ORAL | Status: DC
  Administered 2023-10-26 – 2023-11-02 (×18): 40 mg via ORAL
  Filled 2023-10-26 (×12): qty 1

## 2023-10-26 MED ORDER — PROPOFOL 10 MG/ML IV BOLUS
INTRAVENOUS | Status: DC | PRN
  Administered 2023-10-26: 20 mg via INTRAVENOUS
  Administered 2023-10-26: 50 mg via INTRAVENOUS
  Administered 2023-10-26: 20 mg via INTRAVENOUS

## 2023-10-26 MED ORDER — PROPOFOL 500 MG/50ML IV EMUL
INTRAVENOUS | Status: DC | PRN
  Administered 2023-10-26: 90 ug/kg/min via INTRAVENOUS

## 2023-10-26 MED ORDER — GLUCAGON HCL RDNA (DIAGNOSTIC) 1 MG IJ SOLR
INTRAMUSCULAR | Status: DC | PRN
  Administered 2023-10-26 (×2): .25 mg via INTRAVENOUS

## 2023-10-26 MED ORDER — DEXMEDETOMIDINE HCL IN NACL 80 MCG/20ML IV SOLN
INTRAVENOUS | Status: DC | PRN
  Administered 2023-10-26: 8 ug via INTRAVENOUS

## 2023-10-26 NOTE — Anesthesia Preprocedure Evaluation (Addendum)
 Anesthesia Evaluation  Patient identified by MRN, date of birth, ID band Patient awake    Reviewed: Allergy & Precautions, NPO status , Patient's Chart, lab work & pertinent test results  History of Anesthesia Complications Negative for: history of anesthetic complications  Airway Mallampati: II  TM Distance: >3 FB Neck ROM: Full    Dental  (+) Edentulous Lower, Edentulous Upper   Pulmonary asthma , COPD,  COPD inhaler, Current Smoker and Patient abstained from smoking.   Pulmonary exam normal        Cardiovascular hypertension, Pt. on medications +CHF  Normal cardiovascular exam     Neuro/Psych  PSYCHIATRIC DISORDERS Anxiety Depression    negative neurological ROS     GI/Hepatic ,GERD  Medicated,,(+)     substance abuse  alcohol  use, cocaine use and marijuana use, Hepatitis -, C  Endo/Other  diabetes   Na 132   Renal/GU Dialysis and ESRFRenal disease     Musculoskeletal  (+) Arthritis ,    Abdominal   Peds  Hematology  (+) Blood dyscrasia, anemia   Anesthesia Other Findings Chronic pain Homelessness Noncompliance  Reproductive/Obstetrics                              Anesthesia Physical Anesthesia Plan  ASA: 4  Anesthesia Plan: MAC   Post-op Pain Management:    Induction: Intravenous  PONV Risk Score and Plan: 1 and Treatment may vary due to age or medical condition and Propofol  infusion  Airway Management Planned: Natural Airway and Nasal Cannula  Additional Equipment: None  Intra-op Plan:   Post-operative Plan: Extubation in OR  Informed Consent: I have reviewed the patients History and Physical, chart, labs and discussed the procedure including the risks, benefits and alternatives for the proposed anesthesia with the patient or authorized representative who has indicated his/her understanding and acceptance.       Plan Discussed with: CRNA and  Anesthesiologist  Anesthesia Plan Comments:          Anesthesia Quick Evaluation

## 2023-10-26 NOTE — Progress Notes (Signed)
 [facility]                                                                               [name], is a 63 y.o. female, DOB - [birth year], FMW:[id number] Admit date - 10/22/2023    Outpatient Primary MD for the patient is Pcp, No  LOS - 3  days    Brief summary   [name] is a 63 y.o. 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  Did not receive any HD until she returned to the ED on 10/22/2023 with respiratory distress.   She has a chronic cough which is nonproductive no recent fevers. Has been out of medications over past few weeks. She does not live near the hospital and so getting here for dialysis is a struggle, needing to hitch rides etc. She is upset about her illness progressing and she says she's more open to social work resources. Still smoking 1 PPD cigarettes and she snorts cocaine which she acknowledges ongoing use, no crack use.   Assessment & Plan    Assessment and Plan:   Acute hypoxic respiratory failure secondary to volume overload/acute on chronic congestive heart failure with preserved ejection fraction, persistent bilateral pleural effusion and anemia/ESRD on HD, POA: Patient was placed on BiPAP upon arrival due to hypoxia.  Over the course of few hours, she improved significantly and was weaned to room air.   Fluid management as per HD and IV lasix  160 mg BID.  Continue with strict intake and output.     Acute on chronic anemia/acute blood loss anemia/symptomatic anemia/history of AVM: Patient has a history of AVM, hemoglobin 2 weeks ago was 9.4, she presented with a 5.1.    Patient received a total of 3 units of prbc transfusion, hemoglbin stable areoun d8.  She underwent enteroscopy showing - LA Grade A reflux esophagitis with no bleeding,  Erythematous mucosa in the stomach,  Four angioectasias in the duodenum, one with evidence of recent bleeding. Treated with argon plasma coagulation ( APC). Monitor hemoglobin.     ACS ruled out/demand ischemia ruled in: Troponins were checked which were in 20s and flat indicative of demand ischemia.  No chest pain.  Elevated D-dimer but CTA negative for PE.   Hyponatremia, mild and chronic, likely from hypervolemia.   Qtc prolongation - 511 -- Avoid QT prolonging meds    Social determinants: Homelessness, TOC consulted.   COPD/chronic cough:  no wheezing heard.  Nebs prn, continue home Breztri .  Robitussin as needed.   Hypertension:  Not well controlled. Continue with norvasc  10 mg daily, coreg  6.25 mg BID, hydralazine  prn.    HLD: Continue home Atorvastatin     Polysubstance / cocaine use: Check UDS   Smoking: Nicotine  patch, gum prn; I have discussed tobacco cessation with  the patient.  I have counseled the patient regarding the negative impacts of continued tobacco use including but not limited to lung cancer, COPD, and cardiovascular disease.  I have discussed alternatives to tobacco and modalities that may help facilitate tobacco cessation including but not limited to biofeedback, hypnosis, and medications.  Total time spent with tobacco counseling was 5 minutes.   Mood d/o: Continue home Buspar .    Chronic pain: Continue home Gabapentin , low-dose oxycodone  as needed for brief period of time/have approved 3 more doses if needed.   Abdominal pain and distention: ABD xray showed moderate stool burden. Stool softeners.     Hypokalemia    Estimated body mass index is 18.54 kg/m as calculated from the following:   Height as of this encounter: 5' 7 (1.702 m).   Weight as of this encounter: 53.7 kg.  Code  Status: full code.  DVT Prophylaxis:  SCDs Start: 10/23/23 0648   Level of Care: Level of care: Progressive Family Communication: none at bedside.   Disposition Plan:     Remains inpatient appropriate:  pending.   Procedures:  HD.    Consultants:   Gastroenterology.   Antimicrobials:   Anti-infectives (From admission, onward)    None        Medications  Scheduled Meds:  sodium chloride    Intravenous Once   amLODipine   10 mg Oral Daily   atorvastatin   10 mg Oral Daily   bisacodyl   10 mg Rectal Daily   budesonide -glycopyrrolate -formoterol   2 puff Inhalation BID   busPIRone   10 mg Oral TID   carvedilol   6.25 mg Oral BID WC   Chlorhexidine  Gluconate Cloth  6 each Topical Q0600   ferrous sulfate   325 mg Oral Q breakfast   gabapentin   100 mg Oral TID   nicotine   21 mg Transdermal Daily   pantoprazole   40 mg Oral BID AC   polyethylene glycol  17 g Oral Daily   sodium bicarbonate   1,300 mg Oral BID   sodium chloride  flush  3 mL Intravenous Q12H   Continuous Infusions:  famotidine  (PEPCID ) IV Stopped (10/25/23 2338)   furosemide  160 mg (10/26/23 1608)   PRN Meds:.acetaminophen , ALPRAZolam , guaiFENesin , ipratropium-albuterol , nicotine  polacrilex, traZODone     Subjective:   [name] was seen and examined today. No new complaints today.   Objective:   Vitals:   10/26/23 1318 10/26/23 1330 10/26/23 1340 10/26/23 1632  BP: (!) 100/51 (!) 115/56 (!) 124/57 (!) 140/62  Pulse: 64 63 68 80  Resp: (!) 22 15 20 12   Temp: (!) 97 F (36.1 C)   (!) 97.5 F (36.4 C)  TempSrc: Temporal   Oral  SpO2: 95% 94% 93% 99%  Weight:      Height:        Intake/Output Summary (Last 24 hours) at 10/26/2023 1647 Last data filed at 10/26/2023 1312 Gross per 24 hour  Intake 464 ml  Output --  Net 464 ml   Filed Weights   10/25/23 0500 10/25/23 0814 10/25/23 1207  Weight: 48.6 kg 55.1 kg 53.7 kg     Exam  General exam: Appears calm and comfortable  Respiratory  system: Clear to auscultation. Respiratory effort normal. Cardiovascular system: S1 & S2 heard, RRR. No JVD,  Gastrointestinal system: Abdomen is nondistended, soft and nontender.  Central nervous system: Alert and oriented.  Extremities: Symmetric 5 x 5 power. Skin: No rashes,  Psychiatry: Mood & affect appropriate.     Data Reviewed:  I have personally reviewed following  labs and imaging studies   CBC Lab Results  Component Value Date   WBC 7.1 10/26/2023   RBC 2.92 (L) 10/26/2023   HGB 8.7 (L) 10/26/2023   HCT 25.6 (L) 10/26/2023   MCV 87.7 10/26/2023   MCH 29.8 10/26/2023   PLT 281 10/26/2023   MCHC 34.0 10/26/2023   RDW 19.3 (H) 10/26/2023   LYMPHSABS 1.2 10/26/2023   MONOABS 0.9 10/26/2023   EOSABS 0.4 10/26/2023   BASOSABS 0.1 10/26/2023     Last metabolic panel Lab Results  Component Value Date   NA 132 (L) 10/26/2023   K 3.7 10/26/2023   CL 97 (L) 10/26/2023   CO2 24 10/26/2023   BUN 46 (H) 10/26/2023   CREATININE 3.36 (H) 10/26/2023   GLUCOSE 107 (H) 10/26/2023   GFRNONAA 15 (L) 10/26/2023   GFRAA 36 (L) 08/31/2019   CALCIUM  8.1 (L) 10/26/2023   PHOS 3.6 10/25/2023   PROT 6.7 10/22/2023   ALBUMIN  2.2 (L) 10/25/2023   LABGLOB 3.7 06/06/2022   AGRATIO 1.1 (L) 06/06/2022   BILITOT 0.6 10/22/2023   ALKPHOS 83 10/22/2023   AST 18 10/22/2023   ALT 12 10/22/2023   ANIONGAP 11 10/26/2023    CBG (last 3)  No results for input(s): GLUCAP in the last 72 hours.    Coagulation Profile: No results for input(s): INR, PROTIME in the last 168 hours.   Radiology Studies: No results found.      Elgie Butter M.D. [facility] 10/26/2023, 4:47 PM  Available via Epic secure chat 7am-7pm After 7 pm, please refer to night coverage provider listed on amion.

## 2023-10-26 NOTE — Anesthesia Procedure Notes (Signed)
 Procedure Name: MAC Date/Time: 10/26/2023 12:38 PM  Performed by: Loreli Blima LABOR, CRNAPre-anesthesia Checklist: Patient identified, Emergency Drugs available, Suction available, Patient being monitored and Timeout performed Patient Re-evaluated:Patient Re-evaluated prior to induction Oxygen  Delivery Method: Nasal cannula Placement Confirmation: positive ETCO2

## 2023-10-26 NOTE — Anesthesia Postprocedure Evaluation (Signed)
 Anesthesia Post Note  Patient: [name]  Procedure(s) Performed: ENTEROSCOPY     Patient location during evaluation: PACU Anesthesia Type: MAC Level of consciousness: awake and alert Pain management: pain level controlled Vital Signs Assessment: post-procedure vital signs reviewed and stable Respiratory status: spontaneous breathing, nonlabored ventilation and respiratory function stable Cardiovascular status: stable and blood pressure returned to baseline Anesthetic complications: no   No notable events documented.  Last Vitals:  Vitals:   10/26/23 1330 10/26/23 1340  BP: (!) 115/56 (!) 124/57  Pulse: 63 68  Resp: 15 20  Temp:    SpO2: 94% 93%    Last Pain:  Vitals:   10/26/23 1340  TempSrc:   PainSc: 0-No pain                 Debby FORBES Like

## 2023-10-26 NOTE — Op Note (Signed)
 [facility] Patient Name: [name] Procedure Date : 10/26/2023 MRN: [id number] Attending MD: Gordy CHRISTELLA Starch , MD, [id number] Date of Birth: 04/27/1960 CSN: [id number] Age: 63 Admit Type: Inpatient Procedure:                Small bowel enteroscopy Indications:              Acute post hemorrhagic anemia, Arteriovenous                            malformation in the small intestine Providers:                Gordy CHRISTELLA. Starch, MD, Collene Edu, RN, Robie Breed, RN, Felice Sar, Technician Referring MD:             [facility] Medicines:                Monitored Anesthesia Care Complications:            No immediate complications. Estimated Blood Loss:     Estimated blood loss: none. Procedure:                Pre-Anesthesia Assessment:                           - Prior to the procedure, a History and Physical                            was performed, and patient medications and                            allergies were reviewed. The patient's tolerance of                            previous anesthesia was also reviewed. The risks                            and benefits of the procedure and the sedation                            options and risks were discussed with the patient.                            All questions were answered, and informed consent                            was obtained. Prior Anticoagulants: The patient has                            taken no anticoagulant or antiplatelet agents. ASA                            Grade Assessment: III - A patient with severe  systemic disease. After reviewing the risks and                            benefits, the patient was deemed in satisfactory                            condition to undergo the procedure.                           After obtaining informed consent, the endoscope was                            passed under direct vision. Throughout the                             procedure, the patient's blood pressure, pulse, and                            oxygen  saturations were monitored continuously. The                            PCF-H190TL ([id number]) Olympus colonoscope was                            introduced through the mouth and advanced to the                            proximal jejunum. The small bowel enteroscopy was                            accomplished without difficulty. The patient                            tolerated the procedure well. Scope In: Scope Out: Findings:      LA Grade A (one or more mucosal breaks less than 5 mm, not extending       between tops of 2 mucosal folds) esophagitis with no bleeding was found       at the gastroesophageal junction.      Diffuse mildly erythematous mucosa without bleeding was found in the       entire examined stomach.      Four angioectasias with stigmata of recent bleeding were found in the       second portion of the duodenum, in the third portion of the duodenum and       in the fourth portion of the duodenum. Fulguration to stop the bleeding       and ablate lesions by argon plasma at 0.5 liters/minute and 20 watts was       successful.      There was no evidence of significant pathology in the proximal jejunum. Impression:               - LA Grade A reflux esophagitis with no bleeding.                           - Erythematous mucosa in the stomach.                           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 Treated with argon                            plasma coagulation (APC).                           - The examined portion of the jejunum was normal.                           - No specimens collected. Moderate Sedation:      N/A Recommendation:           - Return patient to hospital ward for ongoing care.                           - Advance diet as tolerated.                           - Indefinite BID  PPI.                           - If possible outpatient hematology consult for                            depot octreotide therapy is recommended given                            multiple admission with GI bleeding due to                            intestinal angioectasias.                           - Monitor Hgb.                           - GI will sign off, call if questions. Procedure Code(s):        --- Professional ---                           [phone], Small intestinal endoscopy, enteroscopy                            beyond second portion of duodenum, not including                            ileum; with control of bleeding (eg, injection,                            bipolar cautery, unipolar cautery, laser, heater                            probe, stapler, plasma coagulator) Diagnosis Code(s):        ---  Professional ---                           K21.00, Gastro-esophageal reflux disease with                            esophagitis, without bleeding                           K31.89, Other diseases of stomach and duodenum                           K31.811, Angiodysplasia of stomach and duodenum                            with bleeding                           D62, Acute posthemorrhagic anemia CPT copyright 2022 American Medical Association. All rights reserved. The codes documented in this report are preliminary and upon coder review may  be revised to meet current compliance requirements. Gordy CHRISTELLA Starch, MD 10/26/2023 1:22:10 PM This report has been signed electronically. Number of Addenda: 0

## 2023-10-26 NOTE — Interval H&P Note (Signed)
 History and Physical Interval Note: For enteroscopy today to again evaluate acute on chronic anemia due to GI blood loss in the setting of intestinal angioectasias The nature of the procedure, as well as the risks, benefits, and alternatives were carefully and thoroughly reviewed with the patient. Ample time for discussion and questions allowed. The patient understood, was satisfied, and agreed to proceed.      Latest Ref Rng & Units 10/26/2023    3:47 AM 10/25/2023    6:23 PM 10/25/2023    9:06 AM  CBC  WBC 4.0 - 10.5 K/uL 7.1     Hemoglobin 12.0 - 15.0 g/dL 8.7  8.8  6.8   Hematocrit 36.0 - 46.0 % 25.6  25.8  20.1   Platelets 150 - 400 K/uL 281         10/26/2023 12:29 PM  [name]  has presented today for surgery, with the diagnosis of Gastrointestinal bleeding, arteriovenous malformations.  The various methods of treatment have been discussed with the patient and family. After consideration of risks, benefits and other options for treatment, the patient has consented to  Procedure(s): ENTEROSCOPY (N/A) as a surgical intervention.  The patient's history has been reviewed, patient examined, no change in status, stable for surgery.  I have reviewed the patient's chart and labs.  Questions were answered to the patient's satisfaction.     Gordy HERO Kadeisha Betsch

## 2023-10-26 NOTE — Transfer of Care (Signed)
 Immediate Anesthesia Transfer of Care Note  Patient: [name]  Procedure(s) Performed: ENTEROSCOPY  Patient Location: PACU  Anesthesia Type:MAC  Level of Consciousness: drowsy  Airway & Oxygen  Therapy: Patient Spontanous Breathing  Post-op Assessment: Report given to RN and Post -op Vital signs reviewed and stable  Post vital signs: Reviewed and stable  Last Vitals:  Vitals Value Taken Time  BP 110/51 10/26/23 13:18  Temp 36.1 C 10/26/23 13:18  Pulse 64 10/26/23 13:19  Resp 20 10/26/23 13:19  SpO2 95 % 10/26/23 13:19  Vitals shown include unfiled device data.  Last Pain:  Vitals:   10/26/23 1318  TempSrc: Temporal  PainSc: Asleep         Complications: No notable events documented.

## 2023-10-26 NOTE — Progress Notes (Signed)
 Mobility Specialist Progress Note;   10/26/23 0923  Mobility  Activity Ambulated with assistance  Level of Assistance Contact guard assist, steadying assist  Assistive Device Other (Comment) (IV pole)  Distance Ambulated (ft) 100 ft  Activity Response Tolerated well  Mobility Referral Yes  Mobility visit 1 Mobility  Mobility Specialist Start Time (ACUTE ONLY) [id number]  Mobility Specialist Stop Time (ACUTE ONLY) 0930  Mobility Specialist Time Calculation (min) (ACUTE ONLY) 7 min   Pt agreeable to mobility. Distance limited d/t fatigue. Required MinG assistance during ambulation for safety. VSS throughout. Pt returned to bed and left with all needs met, call bell in reach.   Lauraine Erm Mobility Specialist Please contact via SecureChat or [employer] [phone]

## 2023-10-26 NOTE — Progress Notes (Signed)
 Admit: 10/22/2023 LOS: 3  60F ESRD here with progressive dysnpea, hypoxia, and AoC anemia.   Subjective:  HD yesterday with 2.3 L UF For small bowel enteroscopy today with GI  08/09 0701 - 08/10 0700 In: 727.3 [Blood:363.3; IV Piggyback:364] Out: 2600 [Urine:300]  Filed Weights   10/25/23 0500 10/25/23 0814 10/25/23 1207  Weight: 48.6 kg 55.1 kg 53.7 kg    Scheduled Meds:  sodium chloride    Intravenous Once   amLODipine   10 mg Oral Daily   atorvastatin   10 mg Oral Daily   bisacodyl   10 mg Rectal Daily   budesonide -glycopyrrolate -formoterol   2 puff Inhalation BID   busPIRone   10 mg Oral TID   carvedilol   6.25 mg Oral BID WC   Chlorhexidine  Gluconate Cloth  6 each Topical Q0600   ferrous sulfate   325 mg Oral Q breakfast   gabapentin   100 mg Oral TID   nicotine   21 mg Transdermal Daily   pantoprazole  (PROTONIX ) IV  40 mg Intravenous BID   polyethylene glycol  17 g Oral Daily   sodium bicarbonate   1,300 mg Oral BID   sodium chloride  flush  3 mL Intravenous Q12H   sucralfate   1 g Oral TID AC   Continuous Infusions:  famotidine  (PEPCID ) IV Stopped (10/25/23 2338)   furosemide  160 mg (10/26/23 0909)   PRN Meds:.acetaminophen , ALPRAZolam , guaiFENesin , ipratropium-albuterol , nicotine  polacrilex, traZODone   Current Labs: reviewed    Physical Exam:  Blood pressure (!) 171/70, pulse 92, temperature 98.4 F (36.9 C), temperature source Oral, resp. rate 19, height 5' 7 (1.702 m), weight 53.7 kg, SpO2 98%. GEN: chronically ill appearing  ENT: NCAT EYES: EOMI CV: regular, no rub PULM: nl WOB. CTAB, diminished in bases ABD: s/nt/n SKIN: no rashes/lesions EXT: No sig LEE VASC: RUE AVF  +b/t  A ESRD: some residual RRF. Uses RUE AVF.  HD cont on THS schedule at this time.  Nonadherent and comes to hospital for HD.  Not historically a candidate for outpt CLIP, would not investigate further until she has stable housing AHRF / DYspnea: from #3 and hypervolemia: Cont UF as able; now  on room air AoC Anemia, hx/o GIB for GI eval; ESA qThursday given 8/7.  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 Morphine  should not be used.  Baclofen should be avoided Avoid oral sodium phosphate  and magnesium  citrate based laxatives / bowel preps    Bernardino Gasman MD 10/26/2023, 10:13 AM  Recent Labs  Lab 10/22/23 2351 10/23/23 0001 10/25/23 0830 10/26/23 0347  NA 130* 131* 133* 132*  K 4.2 4.4 3.0* 3.7  CL 100  --  107 97*  CO2 9*  --  15* 24  GLUCOSE 84  --  91 107*  BUN 92*  --  61* 46*  CREATININE 5.59*  --  3.29* 3.36*  CALCIUM  8.1*  --  6.5* 8.1*  PHOS  --   --  3.6  --    Recent Labs  Lab 10/24/23 1752 10/25/23 0830 10/25/23 0906 10/25/23 1823 10/26/23 0347  WBC 7.4 6.1  --   --  7.1  NEUTROABS 5.0 3.8  --   --  4.7  HGB 7.7* 6.8* 6.8* 8.8*  8.7*  HCT 22.6* 20.5* 20.1* 25.8* 25.6*  MCV 90.8 91.5  --   --  87.7  PLT 289 304  --   --  281

## 2023-10-27 ENCOUNTER — Inpatient Hospital Stay (HOSPITAL_COMMUNITY)

## 2023-10-27 LAB — CBC WITH DIFFERENTIAL/PLATELET
Abs Immature Granulocytes: 0.06 K/uL (ref 0.00–0.07)
Basophils Absolute: 0.1 K/uL (ref 0.0–0.1)
Basophils Relative: 1 %
Eosinophils Absolute: 0.4 K/uL (ref 0.0–0.5)
Eosinophils Relative: 7 %
HCT: 25.5 % — ABNORMAL LOW (ref 36.0–46.0)
Hemoglobin: 8.5 g/dL — ABNORMAL LOW (ref 12.0–15.0)
Immature Granulocytes: 1 %
Lymphocytes Relative: 18 %
Lymphs Abs: 1.1 K/uL (ref 0.7–4.0)
MCH: 30.1 pg (ref 26.0–34.0)
MCHC: 33.3 g/dL (ref 30.0–36.0)
MCV: 90.4 fL (ref 80.0–100.0)
Monocytes Absolute: 0.9 K/uL (ref 0.1–1.0)
Monocytes Relative: 15 %
Neutro Abs: 3.4 K/uL (ref 1.7–7.7)
Neutrophils Relative %: 58 %
Platelets: 306 K/uL (ref 150–400)
RBC: 2.82 MIL/uL — ABNORMAL LOW (ref 3.87–5.11)
RDW: 19.4 % — ABNORMAL HIGH (ref 11.5–15.5)
WBC: 5.9 K/uL (ref 4.0–10.5)
nRBC: 0.3 % — ABNORMAL HIGH (ref 0.0–0.2)

## 2023-10-27 LAB — BASIC METABOLIC PANEL WITH GFR
Anion gap: 14 (ref 5–15)
BUN: 64 mg/dL — ABNORMAL HIGH (ref 8–23)
CO2: 19 mmol/L — ABNORMAL LOW (ref 22–32)
Calcium: 8.4 mg/dL — ABNORMAL LOW (ref 8.9–10.3)
Chloride: 99 mmol/L (ref 98–111)
Creatinine, Ser: 3.97 mg/dL — ABNORMAL HIGH (ref 0.44–1.00)
GFR, Estimated: 12 mL/min — ABNORMAL LOW (ref >60)
Glucose, Bld: 107 mg/dL — ABNORMAL HIGH (ref 70–99)
Potassium: 4.2 mmol/L (ref 3.5–5.1)
Sodium: 132 mmol/L — ABNORMAL LOW (ref 135–145)

## 2023-10-27 LAB — GLUCOSE, CAPILLARY: Glucose-Capillary: 108 mg/dL — ABNORMAL HIGH (ref 70–99)

## 2023-10-27 MED ORDER — DM-GUAIFENESIN ER 30-600 MG PO TB12
1.0000 | ORAL_TABLET | Freq: Two times a day (BID) | ORAL | Status: DC
  Administered 2023-10-27 – 2023-11-02 (×19): 1 via ORAL
  Filled 2023-10-27 (×12): qty 1

## 2023-10-27 MED ORDER — SENNOSIDES-DOCUSATE SODIUM 8.6-50 MG PO TABS
2.0000 | ORAL_TABLET | Freq: Two times a day (BID) | ORAL | Status: DC
  Administered 2023-10-27 – 2023-11-02 (×15): 2 via ORAL
  Filled 2023-10-27 (×12): qty 2

## 2023-10-27 MED ORDER — SUCRALFATE 1 GM/10ML PO SUSP
1.0000 g | Freq: Three times a day (TID) | ORAL | Status: DC
  Administered 2023-10-27 – 2023-11-02 (×30): 1 g via ORAL
  Filled 2023-10-27 (×21): qty 10

## 2023-10-27 MED ORDER — ALBUTEROL SULFATE (2.5 MG/3ML) 0.083% IN NEBU
3.0000 mL | INHALATION_SOLUTION | RESPIRATORY_TRACT | Status: DC | PRN
  Administered 2023-10-31 – 2023-11-01 (×3): 3 mL via RESPIRATORY_TRACT
  Filled 2023-10-27 (×4): qty 3

## 2023-10-27 MED ORDER — HYDROMORPHONE HCL 1 MG/ML IJ SOLN
0.5000 mg | INTRAMUSCULAR | Status: DC | PRN
  Administered 2023-10-27 – 2023-10-28 (×6): 0.5 mg via INTRAVENOUS
  Filled 2023-10-27 (×3): qty 0.5

## 2023-10-27 MED ORDER — BISACODYL 10 MG RE SUPP
10.0000 mg | Freq: Once | RECTAL | Status: AC
  Administered 2023-10-27 (×2): 10 mg via RECTAL
  Filled 2023-10-27: qty 1

## 2023-10-27 MED ORDER — MUPIROCIN 2 % EX OINT
1.0000 | TOPICAL_OINTMENT | Freq: Two times a day (BID) | CUTANEOUS | Status: AC
  Administered 2023-10-27 – 2023-10-31 (×16): 1 via NASAL
  Filled 2023-10-27 (×3): qty 22

## 2023-10-27 MED ORDER — TRAMADOL HCL 50 MG PO TABS
50.0000 mg | ORAL_TABLET | Freq: Two times a day (BID) | ORAL | Status: DC | PRN
  Administered 2023-10-27 – 2023-11-02 (×11): 50 mg via ORAL
  Filled 2023-10-27 (×9): qty 1

## 2023-10-27 MED ORDER — FAMOTIDINE 20 MG PO TABS
20.0000 mg | ORAL_TABLET | Freq: Every day | ORAL | Status: DC
  Administered 2023-10-27 – 2023-11-02 (×9): 20 mg via ORAL
  Filled 2023-10-27 (×6): qty 1

## 2023-10-27 NOTE — Progress Notes (Signed)
 Admit: 10/22/2023 LOS: 4  46F ESRD here with progressive dysnpea, hypoxia, and AoC anemia.   Subjective:  Had small bowel enteroscopy yesterday- multiple GI angioectasias Bloated today For HD tomorrow  08/10 0701 - 08/11 0700 In: 100 [I.V.:100] Out: -   Filed Weights   10/25/23 0814 10/25/23 1207 10/27/23 0608  Weight: 55.1 kg 53.7 kg 50.3 kg    Scheduled Meds:  sodium chloride    Intravenous Once   amLODipine   10 mg Oral Daily   atorvastatin   10 mg Oral Daily   bisacodyl   10 mg Rectal Daily   budesonide -glycopyrrolate -formoterol   2 puff Inhalation BID   busPIRone   10 mg Oral TID   carvedilol   6.25 mg Oral BID WC   Chlorhexidine  Gluconate Cloth  6 each Topical Q0600   dextromethorphan -guaiFENesin   1 tablet Oral BID   famotidine   20 mg Oral QHS   ferrous sulfate   325 mg Oral Q breakfast   gabapentin   100 mg Oral TID   mupirocin  ointment  1 Application Nasal BID   nicotine   21 mg Transdermal Daily   pantoprazole   40 mg Oral BID AC   polyethylene glycol  17 g Oral Daily   senna-docusate  2 tablet Oral BID   sodium bicarbonate   1,300 mg Oral BID   sodium chloride  flush  3 mL Intravenous Q12H   sucralfate   1 g Oral TID WC & HS   Continuous Infusions:  furosemide  160 mg (10/27/23 0835)   PRN Meds:.acetaminophen , albuterol , ALPRAZolam , nicotine  polacrilex, traMADol , traZODone   Current Labs: reviewed    Physical Exam:  Blood pressure (!) 144/65, pulse 74, temperature 97.9 F (36.6 C), temperature source Oral, resp. rate 18, height 5' 7 (1.702 m), weight 50.3 kg, SpO2 100%. GEN: chronically ill appearing  ENT: NCAT EYES: EOMI CV: regular, no rub PULM: nl WOB. CTAB, diminished in bases ABD: s/nt/n SKIN: no rashes/lesions EXT: No sig LEE VASC: RUE AVF  +b/t  A ESRD: some residual RRF. Uses RUE AVF.  HD cont on THS schedule at this time.  Nonadherent and comes to hospital for HD.  Not historically a candidate for outpt CLIP, would not investigate further until she has  stable housing AHRF / DYspnea: from #3 and hypervolemia: Cont UF as able; now on room air  Next HD 10/28/23 AoC Anemia, hx/o GIB for GI eval; ESA qThursday given 8/7.  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, CTM Hyponatremia 2/2 hypervolemia in low GFR state, restrict free water  Homelessness, social work/TOC assisting as able     Cynthia Bonine MD 10/27/2023, 3:17 PM  Recent Labs  Lab 10/25/23 0830 10/26/23 0347 10/27/23 1046  NA 133* 132* 132*  K 3.0* 3.7 4.2  CL 107 97* 99  CO2 15* 24 19*  GLUCOSE 91 107* 107*  BUN 61* 46* 64*  CREATININE 3.29* 3.36* 3.97*  CALCIUM  6.5* 8.1* 8.4*  PHOS 3.6  --   --    Recent Labs  Lab 10/25/23 0830 10/25/23 0906 10/25/23 1823 10/26/23 0347 10/27/23 1046  WBC 6.1  --   --  7.1 5.9  NEUTROABS 3.8  --   --  4.7 3.4  HGB 6.8*   < > 8.8* 8.7* 8.5*  HCT 20.5*   < > 25.8* 25.6* 25.5*  MCV 91.5  --   --  87.7 90.4  PLT 304  --   --  281 306   < > =  values in this interval not displayed.

## 2023-10-27 NOTE — Progress Notes (Signed)
 Mobility Specialist Progress Note:   10/27/23 1009  Mobility  Activity Pivoted/transferred from bed to chair  Level of Assistance Modified independent, requires aide device or extra time  Assistive Device None  Distance Ambulated (ft) 4 ft  Activity Response Tolerated well  Mobility Referral Yes  Mobility visit 1 Mobility  Mobility Specialist Start Time (ACUTE ONLY) 1009  Mobility Specialist Stop Time (ACUTE ONLY) 1012  Mobility Specialist Time Calculation (min) (ACUTE ONLY) 3 min   Pt received in bed, agreeable to mobility session. Declined ambulation at this time, agreeable to transfer B>C. Tolerated well, c/o SOB, SpO2 WFL. Sitting up with all needs met.    Jorian [place]hoite Mobility Specialist Please contact via Special educational needs teacher or  Rehab office at [phone]

## 2023-10-27 NOTE — Progress Notes (Signed)
 [facility]                                                                               [name], is a 63 y.o. female, DOB - 1960-10-27, FMW:[id number] Admit date - 10/22/2023    Outpatient Primary MD for the patient is Pcp, No  LOS - 4  days    Brief summary   [name] is a 63 y.o. 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  Did not receive any HD until she returned to the ED on 10/22/2023 with respiratory distress.   She has a chronic cough which is nonproductive no recent fevers. Has been out of medications over past few weeks. She does not live near the hospital and so getting here for dialysis is a struggle, needing to hitch rides etc. She is upset about her illness progressing and she says she's more open to social work resources. Still smoking 1 PPD cigarettes and she snorts cocaine which she acknowledges ongoing use, no crack use.   Assessment & Plan    Assessment and Plan:   Acute hypoxic respiratory failure secondary to volume overload/acute on chronic congestive heart failure with preserved ejection fraction, persistent bilateral pleural effusion and anemia/ESRD on HD, POA: Patient was placed on BiPAP upon arrival due to hypoxia.  Over the course of few hours, she improved significantly and was weaned to room air.   Fluid management as per HD and IV lasix  160 mg BID.  Continue with strict intake and output.  She is on RA.     Acute on chronic anemia/acute blood loss anemia/symptomatic anemia/history of AVM: Patient has a history of AVM, hemoglobin 2 weeks ago was 9.4, she  presented with a 5.1.   Patient received a total of 3 units of prbc transfusion, hemoglbin stable around 8.  She underwent enteroscopy showing - LA Grade A reflux esophagitis with no bleeding,  Erythematous mucosa in the stomach,  Four angioectasias in the duodenum, one with evidence of recent bleeding. Treated with argon plasma coagulation ( APC). Monitor hemoglobin.  Continue with PPI BID and pepcid  and sucralfate  TID.  She reports abdominal discomfort, get ABD x ray for eval of constipation.      ACS ruled out/demand ischemia ruled in: Troponins were checked which were in 20s and flat indicative of demand ischemia.  No chest pain.  Elevated D-dimer but CTA negative for PE.   Hyponatremia, mild and chronic, likely from hypervolemia.   Qtc prolongation - 511 -- Avoid QT prolonging meds    Social determinants: Homelessness, TOC consulted.   COPD/chronic cough:  no wheezing heard.  Nebs prn, continue home Breztri .  Robitussin as needed.   Hypertension:  Well controlled. Continue with norvasc  10 mg daily, coreg  6.25 mg BID, hydralazine  prn.  HLD: Continue home Atorvastatin     Polysubstance / cocaine use: Check UDS   Smoking: on nicotine  patch.    Mood d/o: Continue home Buspar .    Chronic pain: Continue home Gabapentin , low-dose oxycodone  as needed for brief period of time/have approved 3 more doses if needed.      Hypokalemia    Estimated body mass index is 17.39 kg/m as calculated from the following:   Height as of this encounter: 5' 7 (1.702 m).   Weight as of this encounter: 50.3 kg.  Code Status: full code.  DVT Prophylaxis:  SCDs Start: 10/23/23 0648   Level of Care: Level of care: Progressive Family Communication: none at bedside.   Disposition Plan:     Remains inpatient appropriate:  pending.   Procedures:  HD.    Consultants:   Gastroenterology.   Antimicrobials:   Anti-infectives (From admission, onward)    None         Medications  Scheduled Meds:  sodium chloride    Intravenous Once   amLODipine   10 mg Oral Daily   atorvastatin   10 mg Oral Daily   bisacodyl   10 mg Rectal Daily   budesonide -glycopyrrolate -formoterol   2 puff Inhalation BID   busPIRone   10 mg Oral TID   carvedilol   6.25 mg Oral BID WC   Chlorhexidine  Gluconate Cloth  6 each Topical Q0600   dextromethorphan -guaiFENesin   1 tablet Oral BID   famotidine   20 mg Oral QHS   ferrous sulfate   325 mg Oral Q breakfast   gabapentin   100 mg Oral TID   mupirocin  ointment  1 Application Nasal BID   nicotine   21 mg Transdermal Daily   pantoprazole   40 mg Oral BID AC   polyethylene glycol  17 g Oral Daily   senna-docusate  2 tablet Oral BID   sodium bicarbonate   1,300 mg Oral BID   sodium chloride  flush  3 mL Intravenous Q12H   sucralfate   1 g Oral TID WC & HS   Continuous Infusions:  furosemide  160 mg (10/27/23 1522)   PRN Meds:.acetaminophen , albuterol , ALPRAZolam , nicotine  polacrilex, traMADol , traZODone     Subjective:   [name] was seen and examined today. Abd discomfort. No nausea or vomiting.   Objective:   Vitals:   10/27/23 0608 10/27/23 0738 10/27/23 0749 10/27/23 1138  BP:   (!) 159/76 (!) 144/65  Pulse:  72 74 74  Resp:  20 15 18   Temp:   97.6 F (36.4 C) 97.9 F (36.6 C)  TempSrc:   Oral Oral  SpO2:  100% 100% 100%  Weight: 50.3 kg     Height:       No intake or output data in the 24 hours ending 10/27/23 1528  Filed Weights   10/25/23 0814 10/25/23 1207 10/27/23 0608  Weight: 55.1 kg 53.7 kg 50.3 kg     Exam  General exam: Appears calm and comfortable  Respiratory system: Clear to auscultation. Respiratory effort normal. Cardiovascular system: S1 & S2 heard, RRR. Gastrointestinal system: Abdomen is soft , distended, bs+ Central nervous system: Alert and oriented. Extremities: no edema.  Skin: No rashes, Psychiatry: Mood & affect appropriate.      Data Reviewed:  I have personally  reviewed following labs and imaging studies   CBC Lab Results  Component Value Date   WBC 5.9 10/27/2023   RBC 2.82 (L) 10/27/2023   HGB 8.5 (L) 10/27/2023   HCT 25.5 (L) 10/27/2023   MCV 90.4 10/27/2023   MCH 30.1 10/27/2023  PLT 306 10/27/2023   MCHC 33.3 10/27/2023   RDW 19.4 (H) 10/27/2023   LYMPHSABS 1.1 10/27/2023   MONOABS 0.9 10/27/2023   EOSABS 0.4 10/27/2023   BASOSABS 0.1 10/27/2023     Last metabolic panel Lab Results  Component Value Date   NA 132 (L) 10/27/2023   K 4.2 10/27/2023   CL 99 10/27/2023   CO2 19 (L) 10/27/2023   BUN 64 (H) 10/27/2023   CREATININE 3.97 (H) 10/27/2023   GLUCOSE 107 (H) 10/27/2023   GFRNONAA 12 (L) 10/27/2023   GFRAA 36 (L) 08/31/2019   CALCIUM  8.4 (L) 10/27/2023   PHOS 3.6 10/25/2023   PROT 6.7 10/22/2023   ALBUMIN  2.2 (L) 10/25/2023   LABGLOB 3.7 06/06/2022   AGRATIO 1.1 (L) 06/06/2022   BILITOT 0.6 10/22/2023   ALKPHOS 83 10/22/2023   AST 18 10/22/2023   ALT 12 10/22/2023   ANIONGAP 14 10/27/2023    CBG (last 3)  Recent Labs    10/26/23 2006 10/26/23 2357 10/27/23 0400  GLUCAP 106* 101* 108*      Coagulation Profile: No results for input(s): INR, PROTIME in the last 168 hours.   Radiology Studies: No results found.      Elgie Butter M.D. [facility] 10/27/2023, 3:28 PM  Available via Epic secure chat 7am-7pm After 7 pm, please refer to night coverage provider listed on amion.

## 2023-10-28 ENCOUNTER — Encounter (HOSPITAL_COMMUNITY): Payer: Self-pay | Admitting: Internal Medicine

## 2023-10-28 ENCOUNTER — Inpatient Hospital Stay (HOSPITAL_COMMUNITY)

## 2023-10-28 LAB — CBC WITH DIFFERENTIAL/PLATELET
Abs Immature Granulocytes: 0.04 K/uL (ref 0.00–0.07)
Basophils Absolute: 0 K/uL (ref 0.0–0.1)
Basophils Relative: 1 %
Eosinophils Absolute: 0.4 K/uL (ref 0.0–0.5)
Eosinophils Relative: 5 %
HCT: 23.9 % — ABNORMAL LOW (ref 36.0–46.0)
Hemoglobin: 7.8 g/dL — ABNORMAL LOW (ref 12.0–15.0)
Immature Granulocytes: 1 %
Lymphocytes Relative: 15 %
Lymphs Abs: 1.2 K/uL (ref 0.7–4.0)
MCH: 29.9 pg (ref 26.0–34.0)
MCHC: 32.6 g/dL (ref 30.0–36.0)
MCV: 91.6 fL (ref 80.0–100.0)
Monocytes Absolute: 1.2 K/uL — ABNORMAL HIGH (ref 0.1–1.0)
Monocytes Relative: 16 %
Neutro Abs: 5.1 K/uL (ref 1.7–7.7)
Neutrophils Relative %: 62 %
Platelets: 314 K/uL (ref 150–400)
RBC: 2.61 MIL/uL — ABNORMAL LOW (ref 3.87–5.11)
RDW: 19.4 % — ABNORMAL HIGH (ref 11.5–15.5)
WBC: 8 K/uL (ref 4.0–10.5)
nRBC: 0.3 % — ABNORMAL HIGH (ref 0.0–0.2)

## 2023-10-28 LAB — RENAL FUNCTION PANEL
Albumin: 2.4 g/dL — ABNORMAL LOW (ref 3.5–5.0)
Anion gap: 16 — ABNORMAL HIGH (ref 5–15)
BUN: 72 mg/dL — ABNORMAL HIGH (ref 8–23)
CO2: 18 mmol/L — ABNORMAL LOW (ref 22–32)
Calcium: 8 mg/dL — ABNORMAL LOW (ref 8.9–10.3)
Chloride: 93 mmol/L — ABNORMAL LOW (ref 98–111)
Creatinine, Ser: 4.25 mg/dL — ABNORMAL HIGH (ref 0.44–1.00)
GFR, Estimated: 11 mL/min — ABNORMAL LOW (ref >60)
Glucose, Bld: 119 mg/dL — ABNORMAL HIGH (ref 70–99)
Phosphorus: 3.5 mg/dL (ref 2.5–4.6)
Potassium: 4 mmol/L (ref 3.5–5.1)
Sodium: 127 mmol/L — ABNORMAL LOW (ref 135–145)

## 2023-10-28 NOTE — Progress Notes (Signed)
 Admit: 10/22/2023 LOS: 5  4F ESRD here with progressive dysnpea, hypoxia, and AoC anemia.   Subjective:  S/p HD.  Eating lunch this PM.  Intermittently tearful.  08/11 0701 - 08/12 0700 In: 744 [P.O.:480; IV Piggyback:264] Out: 550 [Urine:550]  Filed Weights   10/28/23 0440 10/28/23 0918 10/28/23 1215  Weight: 53.6 kg 54 kg (P) 53 kg    Scheduled Meds:  sodium chloride    Intravenous Once   amLODipine   10 mg Oral Daily   atorvastatin   10 mg Oral Daily   bisacodyl   10 mg Rectal Daily   budesonide -glycopyrrolate -formoterol   2 puff Inhalation BID   busPIRone   10 mg Oral TID   carvedilol   6.25 mg Oral BID WC   Chlorhexidine  Gluconate Cloth  6 each Topical Q0600   dextromethorphan -guaiFENesin   1 tablet Oral BID   famotidine   20 mg Oral QHS   ferrous sulfate   325 mg Oral Q breakfast   gabapentin   100 mg Oral TID   mupirocin  ointment  1 Application Nasal BID   nicotine   21 mg Transdermal Daily   pantoprazole   40 mg Oral BID AC   senna-docusate  2 tablet Oral BID   sodium bicarbonate   1,300 mg Oral BID   sodium chloride  flush  3 mL Intravenous Q12H   sucralfate   1 g Oral TID WC & HS   Continuous Infusions:  furosemide  160 mg (10/28/23 1350)   PRN Meds:.acetaminophen , albuterol , ALPRAZolam , nicotine  polacrilex, traMADol , traZODone   Current Labs: reviewed    Physical Exam:  Blood pressure (!) 158/63, pulse 83, temperature 98 F (36.7 C), temperature source Axillary, resp. rate 20, height 5' 7 (1.702 m), weight (P) 53 kg, SpO2 96%. GEN: chronically ill appearing  ENT: NCAT EYES: EOMI CV: regular, no rub PULM: nl WOB. CTAB, diminished in bases ABD: s/nt/n SKIN: no rashes/lesions EXT: No sig LEE VASC: RUE AVF  +b/t  A ESRD: some residual RRF. Uses RUE AVF.  HD cont on THS schedule at this time.  Nonadherent and comes to hospital for HD.  Not historically a candidate for outpt CLIP, would not investigate further until she has stable housing AHRF / DYspnea: from #3 and  hypervolemia: Cont UF as able; now on room air  Next HD 10/30/23 AoC Anemia, hx/o GIB for GI eval; ESA qThursday given 8/7.  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, CTM Hyponatremia 2/2 hypervolemia in low GFR state, restrict free water  Homelessness, social work/TOC assisting as able     Cynthia Bonine MD 10/28/2023, 3:30 PM  Recent Labs  Lab 10/25/23 0830 10/26/23 0347 10/27/23 1046 10/28/23 0924  NA 133* 132* 132* 127*  K 3.0* 3.7 4.2 4.0  CL 107 97* 99 93*  CO2 15* 24 19* 18*  GLUCOSE 91 107* 107* 119*  BUN 61* 46* 64* 72*  CREATININE 3.29* 3.36* 3.97* 4.25*  CALCIUM  6.5* 8.1* 8.4* 8.0*  PHOS 3.6  --   --  3.5   Recent Labs  Lab 10/26/23 0347 10/27/23 1046 10/28/23 0350  WBC 7.1 5.9 8.0  NEUTROABS 4.7 3.4 5.1  HGB 8.7* 8.5* 7.8*  HCT 25.6* 25.5* 23.9*  MCV 87.7 90.4 91.6  PLT 281 306 314

## 2023-10-28 NOTE — Progress Notes (Signed)
 Received patient in bed to unit.  Alert and oriented.  Informed consent signed and in chart.   TX duration:1.53  Patient didn't tolerated well. Patient kept saying she didn't feel well. Kept having coughing spells. Signed out AMA Transported back to the room  Alert, without acute distress.  Hand-off given to patient's nurse.   Access used: AVF Access issues: na  Total UF removed: 1.9L Medication(s) given: NA Post HD weight: 53Kg   10/28/23 1215  Vitals  Temp 98 F (36.7 C)  Temp Source Axillary  BP (!) 171/81  MAP (mmHg) 109  Pulse Rate 94  ECG Heart Rate 93  Resp 20  Weight 53 kg  Type of Weight Post-Dialysis  Oxygen  Therapy  SpO2 94 %  O2 Device Room Air  During Treatment Monitoring  Blood Flow Rate (mL/min) 0 mL/min  Arterial Pressure (mmHg) 37.57 mmHg  Venous Pressure (mmHg) 3.43 mmHg  TMP (mmHg) 61.21 mmHg  Ultrafiltration Rate (mL/min) 1448 mL/min  Dialysate Flow Rate (mL/min) 299 ml/min  Dialysate Potassium Concentration 3  Dialysate Calcium  Concentration 2.5  Duration of HD Treatment -hour(s) 1.89 hour(s)  Cumulative Fluid Removed (mL) per Treatment  1881.42  HD Safety Checks Performed Yes  Intra-Hemodialysis Comments Tx completed (patient signed out AMA)  Post Treatment  Dialyzer Clearance Lightly streaked  Liters Processed 22.6  Fluid Removed (mL) 1900 mL  AVG/AVF Arterial Site Held (minutes) 5 minutes  AVG/AVF Venous Site Held (minutes) 5 minutes  Fistula / Graft Right Upper arm Arteriovenous fistula  Placement Date/Time: 08/14/22 1230   Orientation: Right  Access Location: (c) Upper arm  Access Type: Arteriovenous fistula  Site Condition No complications  Fistula / Graft Assessment Present;Bruit;Thrill  Status Deaccessed  Drainage Description None      [facility]

## 2023-10-28 NOTE — Progress Notes (Signed)
 Physical Therapy Treatment Patient Details Name: [name] MRN: [id number] DOB: 01-Aug-1960 Today's Date: 10/28/2023   History of Present Illness Pt is 63 yo presenting to [facility] on 8/6 due to respiratory distress. Pt was recently admitted 7/5-7/12 due to volume overload. Pt returned to ED 7/20 for HD and did not receive again until returning to ED on 8/6. PMHx: ESRD with noncompliance with HD, T2DM, COPD, PAD, HTN, GERD, GIB, AVM, CHF, anemia, polysubstance abuse, HLD, hep C, tobacco use, homelessness.    PT Comments  Pt received in chair, agreeable to therapy session and with poor tolerance for standing due to c/o abdominal pain/pressure, RN notified. Pt c/o constipation type pain and requesting meds to address bowel pressure/pain, pt given ice chips also per her request. Pt VSS on RA. Pt needing up to CGA to pivot back to bed with RW support due to increased c/o pain this date. Pt internally distracted due to tearfulness with severe c/o pain, pt encouraged to continue OOB mobility efforts as this may improve her intestinal motility and decrease her pain. Pt continues to benefit from PT services to progress toward functional mobility goals.     If plan is discharge home, recommend the following: Other (comment)   Can travel by private [employer]    Recommendations for Other Services       Precautions / Restrictions Precautions Precautions: Fall Recall of Precautions/Restrictions: Impaired Precaution/Restrictions Comments: Contact precs; reluctant to mobilize Restrictions Weight Bearing Restrictions Per Provider Order: No     Mobility  Bed Mobility Overal bed mobility: Needs Assistance Bed Mobility: Sit to Supine       Sit to supine: Supervision   General bed mobility comments: CGA due to pt increased c/o pain and poor attention to task. Likely pt could perform with less assist when not distracted/in severe pain. Pt tearful during  activity.    Transfers Overall transfer level: Needs assistance Equipment used: Rolling walker (2 wheels) Transfers: Sit to/from Stand, Bed to chair/wheelchair/BSC Sit to Stand: Contact guard assist   Step pivot transfers: Contact guard assist       General transfer comment: from chair>RW and RW>EOB, CGA for safety as pt with lability due to pain and poor attention to task.    Ambulation/Gait Ambulation/Gait assistance: Contact guard assist Gait Distance (Feet): 4 Feet Assistive device: Rolling walker (2 wheels) Gait Pattern/deviations: Narrow base of support, Trunk flexed, Step-to pattern       General Gait Details: limited tolerance due to c/o abdominal pain/pressure and guarding so only stepped a few feet back to bed with RW support.   Stairs             Wheelchair Mobility     Tilt Bed    Modified Rankin (Stroke Patients Only)       Balance Overall balance assessment: Mild deficits observed, not formally tested                                          Communication Communication Communication: No apparent difficulties  Cognition Arousal: Alert Behavior During Therapy: Anxious, Lability   PT - Cognitive impairments: No family/caregiver present to determine baseline, Safety/Judgement, Attention                       PT - Cognition Comments: Pt internally distracted  due to c/o pain and reduced attention to task. Initially somewhat somnolent but alert once agreeable to transfer back to bed from chair, but poor attention to how increased activity/standing and transfers may help with gas type pain/pressure. Following commands: Impaired Following commands impaired: Follows one step commands with increased time, Follows multi-step commands inconsistently    Cueing Cueing Techniques: Verbal cues, Gestural cues  Exercises Other Exercises Other Exercises: cued for seated BLE exercises but poor participation due to c/o  pain/constipation    General Comments General comments (skin integrity, edema, etc.): BP 136/66 HR 81 bpm seated prior to standing transfer, SpO2 96% on RA; no dizziness while standing      Pertinent Vitals/Pain Pain Assessment Pain Assessment: Faces Faces Pain Scale: Hurts whole lot Pain Location: abdomen Pain Descriptors / Indicators: Discomfort, Grimacing, Guarding, Tightness, Sharp, Stabbing Pain Intervention(s): Limited activity within patient's tolerance, Monitored during session, Repositioned, Patient requesting pain meds-RN notified, Other (comment) (pt given ice chips in cup per her request, RN OK with this)    Home Living                          Prior Function            PT Goals (current goals can now be found in the care plan section) Acute Rehab PT Goals Patient Stated Goal: Less gas pain and to get a cane PT Goal Formulation: With patient Time For Goal Achievement: 11/08/23 Progress towards PT goals: Progressing toward goals    Frequency    Min 1X/week      PT Plan      Co-evaluation              AM-PAC PT 6 Clicks Mobility   Outcome Measure  Help needed turning from your back to your side while in a flat bed without using bedrails?: None Help needed moving from lying on your back to sitting on the side of a flat bed without using bedrails?: A Little Help needed moving to and from a bed to a chair (including a wheelchair)?: A Little Help needed standing up from a chair using your arms (e.g., wheelchair or bedside chair)?: A Little Help needed to walk in hospital room?: A Little Help needed climbing 3-5 steps with a railing? : A Lot 6 Click Score: 18    End of Session Equipment Utilized During Treatment:  (pt refusing gait belt) Activity Tolerance: Patient limited by pain;Other (comment) (tearful, c/o constipation) Patient left: in bed;with call bell/phone within reach;with bed alarm set;Other (comment) (pt BLE elevated on pillow  with heels floated to see if this will reduce pressure on abdomen/back and reduce gas pain) Nurse Communication: Mobility status;Patient requests pain meds;Other (comment) (pt requesting meds for constipation) PT Visit Diagnosis: Unsteadiness on feet (R26.81);Other abnormalities of gait and mobility (R26.89)     Time: 8367-8354 PT Time Calculation (min) (ACUTE ONLY): 13 min  Charges:    $Therapeutic Activity: 8-22 mins PT General Charges $$ ACUTE PT VISIT: 1 Visit                     Levie Wages P., PTA Acute Rehabilitation Services Secure Chat Preferred 9a-5:30pm Office: [phone]    Connell HERO [facility] 10/28/2023, 5:08 PM

## 2023-10-28 NOTE — Plan of Care (Signed)
  Problem: Education: Goal: Knowledge of General Education information will improve Description: Including pain rating scale, medication(s)/side effects and non-pharmacologic comfort measures Outcome: Progressing   Problem: Health Behavior/Discharge Planning: Goal: Ability to manage health-related needs will improve Outcome: Progressing   Problem: Clinical Measurements: Goal: Ability to maintain clinical measurements within normal limits will improve Outcome: Progressing Goal: Will remain free from infection Outcome: Progressing Goal: Diagnostic test results will improve Outcome: Progressing Goal: Respiratory complications will improve Outcome: Progressing Goal: Cardiovascular complication will be avoided Outcome: Progressing   Problem: Coping: Goal: Level of anxiety will decrease Outcome: Progressing   Problem: Elimination: Goal: Will not experience complications related to bowel motility Outcome: Progressing Goal: Will not experience complications related to urinary retention Outcome: Progressing   Problem: Pain Managment: Goal: General experience of comfort will improve and/or be controlled Outcome: Progressing   Problem: Safety: Goal: Ability to remain free from injury will improve Outcome: Progressing   Problem: Skin Integrity: Goal: Risk for impaired skin integrity will decrease Outcome: Progressing

## 2023-10-28 NOTE — Plan of Care (Signed)

## 2023-10-28 NOTE — Progress Notes (Signed)
 [facility]                                                                               [name], is a 63 y.o. female, DOB - 1961-03-09, FMW:[id number] Admit date - 10/22/2023    Outpatient Primary MD for the patient is Pcp, No  LOS - 5  days    Brief summary   [name] is a 63 y.o. 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  Did not receive any HD until she returned to the ED on 10/22/2023 with respiratory distress.   She has a chronic cough which is nonproductive no recent fevers. Has been out of medications over past few weeks. She does not live near the hospital and so getting here for dialysis is a struggle, needing to hitch rides etc. She is upset about her illness progressing and she says she's more open to social work resources. Still smoking 1 PPD cigarettes and she snorts cocaine which she acknowledges ongoing use, no crack use.   Assessment & Plan    Assessment and Plan:   Acute hypoxic respiratory failure secondary to volume overload/acute on chronic congestive heart failure with preserved ejection fraction, persistent bilateral pleural effusion and anemia/ESRD on HD, POA:  Patient was placed on BiPAP upon arrival due to hypoxia.  Over the course of few hours, she improved significantly and was weaned to room air.   Fluid management as per HD and IV lasix  160 mg BID.  Continue with strict intake and output.  She is on RA.     Acute on chronic anemia/acute blood loss anemia/symptomatic anemia/history of AVM: Patient has a history of AVM, hemoglobin 2 weeks ago was 9.4, she  presented with a 5.1.   Patient received a total of 3 units of prbc transfusion, hemoglbin stable around 8.  She underwent enteroscopy showing - LA Grade A reflux esophagitis with no bleeding,  Erythematous mucosa in the stomach,  Four angioectasias in the duodenum, one with evidence of recent bleeding. Treated with argon plasma coagulation ( APC). Monitor hemoglobin.  Continue with PPI BID and pepcid  and sucralfate  TID.       ACS ruled out/demand ischemia ruled in:  Troponins were checked which were in 20s and flat indicative of demand ischemia.   No chest pain.  Elevated D-dimer but CTA negative for PE.   Hyponatremia, mild and chronic, likely from hypervolemia.   Qtc prolongation - 511 -- Avoid QT prolonging meds    Social determinants: Homelessness, TOC consulted.   COPD/chronic cough:  no wheezing heard.  Nebs prn, continue home Breztri .  Robitussin as needed.   Hypertension:  Well controlled. Continue with norvasc  10 mg daily, coreg  6.25 mg BID, hydralazine  prn.    HLD: Continue home Atorvastatin   Polysubstance / cocaine use: Check UDS   Smoking: on nicotine  patch.    Mood disorder:  Continue home Buspar .    Chronic pain: Continue home Gabapentin , low-dose oxycodone  as needed for brief period of time/have approved 3 more doses if needed.     Abdominal pain:  Abd x ray showing constipation.  Senna /colace and dulcolax suppository added.     Estimated body mass index is 18.3 kg/m (pended) as calculated from the following:   Height as of this encounter: 5' 7 (1.702 m).   Weight as of this encounter: (P) 53 kg.  Code Status: full code.  DVT Prophylaxis:  SCDs Start: 10/23/23 0648   Level of Care: Level of care: Telemetry Medical Family Communication: none at bedside.   Disposition Plan:     Remains inpatient appropriate: pending nephrology clearance.  Therapy evaluations ordered.   Procedures:  HD.    Consultants:   Gastroenterology.  Nephrology.    Antimicrobials:   Anti-infectives (From admission, onward)    None        Medications  Scheduled Meds:  sodium chloride    Intravenous Once   amLODipine   10 mg Oral Daily   atorvastatin   10 mg Oral Daily   bisacodyl   10 mg Rectal Daily   budesonide -glycopyrrolate -formoterol   2 puff Inhalation BID   busPIRone   10 mg Oral TID   carvedilol   6.25 mg Oral BID WC   Chlorhexidine  Gluconate Cloth  6 each Topical Q0600   dextromethorphan -guaiFENesin   1 tablet Oral BID   famotidine   20 mg Oral QHS   ferrous sulfate   325 mg Oral Q breakfast   gabapentin   100 mg Oral TID   mupirocin  ointment  1 Application Nasal BID   nicotine   21 mg Transdermal Daily   pantoprazole   40 mg Oral BID AC   senna-docusate  2 tablet Oral BID   sodium bicarbonate   1,300 mg Oral BID   sodium chloride  flush  3 mL Intravenous Q12H   sucralfate   1 g Oral TID WC & HS   Continuous Infusions:  furosemide  160 mg (10/28/23 1350)   PRN Meds:.acetaminophen , albuterol , ALPRAZolam , nicotine  polacrilex, traMADol , traZODone     Subjective:   Rhealyn Zaragoza was seen and examined today. Abdominal discomfort.   Objective:   Vitals:   10/28/23 1205 10/28/23 1215 10/28/23 1237 10/28/23 1500  BP: (!) 149/63 (!) 171/81 (!) 158/63 136/66  Pulse: 76 94 83 81  Resp: 12 20 20 18   Temp:  98 F (36.7 C)    TempSrc:  Axillary    SpO2: 93% 94% 96% 96%  Weight:  (P) 53 kg    Height:        Intake/Output Summary (Last 24 hours) at 10/28/2023 1834 Last data filed at 10/28/2023 1215 Gross per 24 hour  Intake --  Output 1900 ml  Net -1900 ml    Filed Weights   10/28/23 0440 10/28/23 0918 10/28/23 1215  Weight: 53.6 kg 54 kg (P) 53 kg     Exam  General exam: Appears calm and comfortable  Respiratory system: Clear to auscultation. Respiratory effort normal. Cardiovascular system: S1 & S2 heard, RRR.  Gastrointestinal system: Abdomen is nondistended, soft and nontender. Central nervous system: Alert and  oriented. No focal neurological deficits. Extremities: Symmetric 5 x 5 power. Skin: No rashes,  Psychiatry: anxious.       Data Reviewed:  I have personally reviewed following labs and imaging studies   CBC Lab Results  Component Value Date  WBC 8.0 10/28/2023   RBC 2.61 (L) 10/28/2023   HGB 7.8 (L) 10/28/2023   HCT 23.9 (L) 10/28/2023   MCV 91.6 10/28/2023   MCH 29.9 10/28/2023   PLT 314 10/28/2023   MCHC 32.6 10/28/2023   RDW 19.4 (H) 10/28/2023   LYMPHSABS 1.2 10/28/2023   MONOABS 1.2 (H) 10/28/2023   EOSABS 0.4 10/28/2023   BASOSABS 0.0 10/28/2023     Last metabolic panel Lab Results  Component Value Date   NA 127 (L) 10/28/2023   K 4.0 10/28/2023   CL 93 (L) 10/28/2023   CO2 18 (L) 10/28/2023   BUN 72 (H) 10/28/2023   CREATININE 4.25 (H) 10/28/2023   GLUCOSE 119 (H) 10/28/2023   GFRNONAA 11 (L) 10/28/2023   GFRAA 36 (L) 08/31/2019   CALCIUM  8.0 (L) 10/28/2023   PHOS 3.5 10/28/2023   PROT 6.7 10/22/2023   ALBUMIN  2.4 (L) 10/28/2023   LABGLOB 3.7 06/06/2022   AGRATIO 1.1 (L) 06/06/2022   BILITOT 0.6 10/22/2023   ALKPHOS 83 10/22/2023   AST 18 10/22/2023   ALT 12 10/22/2023   ANIONGAP 16 (H) 10/28/2023    CBG (last 3)  Recent Labs    10/26/23 2006 10/26/23 2357 10/27/23 0400  GLUCAP 106* 101* 108*      Coagulation Profile: No results for input(s): INR, PROTIME in the last 168 hours.   Radiology Studies: DG Abd 1 View Result Date: 10/27/2023 CLINICAL DATA:  [id number] Abdominal pain [id number] EXAM: ABDOMEN - 1 VIEW COMPARISON:  October 24, 2023 FINDINGS: Nonobstructive bowel gas pattern. Moderate volume fecal loading throughout the colon. No pneumoperitoneum. No organomegaly or radiopaque calculi. No acute fracture or destructive lesion. Multilevel degenerative disc disease of the spine. Blunting of both costophrenic sulci, likely trace effusions. IMPRESSION: Nonobstructive bowel gas pattern. Moderate volume fecal loading throughout the colon, as  can be seen in constipation. Electronically Signed   By: Rogelia Myers M.D.   On: 10/27/2023 16:57        Elgie Butter M.D. [facility] 10/28/2023, 6:34 PM  Available via Epic secure chat 7am-7pm After 7 pm, please refer to night coverage provider listed on amion.

## 2023-10-29 LAB — CBC WITH DIFFERENTIAL/PLATELET
Abs Immature Granulocytes: 0.05 K/uL (ref 0.00–0.07)
Basophils Absolute: 0 K/uL (ref 0.0–0.1)
Basophils Relative: 1 %
Eosinophils Absolute: 0.3 K/uL (ref 0.0–0.5)
Eosinophils Relative: 4 %
HCT: 21.3 % — ABNORMAL LOW (ref 36.0–46.0)
Hemoglobin: 6.8 g/dL — CL (ref 12.0–15.0)
Immature Granulocytes: 1 %
Lymphocytes Relative: 19 %
Lymphs Abs: 1.3 K/uL (ref 0.7–4.0)
MCH: 29.3 pg (ref 26.0–34.0)
MCHC: 31.9 g/dL (ref 30.0–36.0)
MCV: 91.8 fL (ref 80.0–100.0)
Monocytes Absolute: 1 K/uL (ref 0.1–1.0)
Monocytes Relative: 15 %
Neutro Abs: 4 K/uL (ref 1.7–7.7)
Neutrophils Relative %: 60 %
Platelets: 313 K/uL (ref 150–400)
RBC: 2.32 MIL/uL — ABNORMAL LOW (ref 3.87–5.11)
RDW: 19.5 % — ABNORMAL HIGH (ref 11.5–15.5)
WBC: 6.6 K/uL (ref 4.0–10.5)
nRBC: 0 % (ref 0.0–0.2)

## 2023-10-29 LAB — BASIC METABOLIC PANEL WITH GFR
Anion gap: 15 (ref 5–15)
BUN: 53 mg/dL — ABNORMAL HIGH (ref 8–23)
CO2: 22 mmol/L (ref 22–32)
Calcium: 8.2 mg/dL — ABNORMAL LOW (ref 8.9–10.3)
Chloride: 95 mmol/L — ABNORMAL LOW (ref 98–111)
Creatinine, Ser: 3.49 mg/dL — ABNORMAL HIGH (ref 0.44–1.00)
GFR, Estimated: 14 mL/min — ABNORMAL LOW (ref >60)
Glucose, Bld: 113 mg/dL — ABNORMAL HIGH (ref 70–99)
Potassium: 3.8 mmol/L (ref 3.5–5.1)
Sodium: 132 mmol/L — ABNORMAL LOW (ref 135–145)

## 2023-10-29 LAB — HEMOGLOBIN AND HEMATOCRIT, BLOOD
HCT: 28 % — ABNORMAL LOW (ref 36.0–46.0)
Hemoglobin: 9 g/dL — ABNORMAL LOW (ref 12.0–15.0)

## 2023-10-29 LAB — PREPARE RBC (CROSSMATCH)

## 2023-10-29 MED ORDER — LACTULOSE 10 GM/15ML PO SOLN
30.0000 g | Freq: Three times a day (TID) | ORAL | Status: DC
  Administered 2023-10-29 – 2023-11-02 (×8): 30 g via ORAL
  Filled 2023-10-29: qty 60
  Filled 2023-10-29 (×3): qty 45
  Filled 2023-10-29: qty 60
  Filled 2023-10-29: qty 45
  Filled 2023-10-29 (×2): qty 60
  Filled 2023-10-29: qty 45
  Filled 2023-10-29: qty 60

## 2023-10-29 MED ORDER — HYDROMORPHONE HCL 1 MG/ML IJ SOLN
0.5000 mg | Freq: Once | INTRAMUSCULAR | Status: AC
  Administered 2023-10-29 (×2): 0.5 mg via INTRAVENOUS
  Filled 2023-10-29: qty 0.5

## 2023-10-29 MED ORDER — ONDANSETRON HCL 4 MG/2ML IJ SOLN
4.0000 mg | Freq: Four times a day (QID) | INTRAMUSCULAR | Status: DC | PRN
  Administered 2023-10-29 (×2): 4 mg via INTRAVENOUS
  Filled 2023-10-29: qty 2

## 2023-10-29 MED ORDER — MILK AND MOLASSES ENEMA
1.0000 | Freq: Once | RECTAL | Status: AC
  Administered 2023-10-29 (×2): 150 mL via RECTAL
  Filled 2023-10-29: qty 150

## 2023-10-29 MED ORDER — SODIUM CHLORIDE 0.9% IV SOLUTION: Freq: Once | INTRAVENOUS | Status: AC

## 2023-10-29 NOTE — Plan of Care (Signed)

## 2023-10-29 NOTE — Progress Notes (Signed)
 PT Cancellation Note  Patient Details Name: [name] MRN: [id number] DOB: 12/06/1960   Cancelled Treatment:    Reason Eval/Treat Not Completed: (P) Fatigue/lethargy limiting ability to participate;Pain limiting ability to participate (pt defers due to fatigue after amb in hallway with mobility team in AM. PTA reinforces benefits of mobility as pt reports she has still not had significant BM and could work on curb step negotiation or other goals, pt again defers later in day.) Discussion on how we could see later in day to avoid seeing her on HD days but pt reports she will not participate due to plan for enema later. Will continue efforts per PT plan of care as schedule permits.   Allisyn Kunz M Eniyah Eastmond 10/29/2023, 12:30 PM

## 2023-10-29 NOTE — Progress Notes (Deleted)
 [name] [id number] 09-10-1960   Chief Complaint:  Referring Provider: Vicci Barnie NOVAK, MD Primary GI MD: Dr. Legrand  HPI: [name] is a 63 y.o. 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    Patient recently admitted 7/5 to 09/27/2023 and left AMA at that time, had been admitted for AHRF in setting of volume overload, had returned to the ED 7/20 and underwent hemodialysis.  Patient currently admitted   Previous GI Procedures/Imaging      Past Medical History:  Diagnosis Date   Acute on chronic blood loss anemia 08/01/2022   Acute on chronic diastolic CHF (congestive heart failure) (HCC) 08/12/2022   Acute on chronic respiratory failure with hypoxia (HCC) 05/16/2023   Acute pulmonary edema (HCC) 05/16/2023   Acute seasonal allergic rhinitis due to pollen 02/22/2016   Alcohol  abuse    Allergy    Anxiety    Arteriovenous fistula infection, initial encounter (HCC) 04/16/2023   Arthritis    Asthma    Cannabis abuse    Cocaine abuse (HCC)    COPD (chronic obstructive pulmonary disease) (HCC)    COPD with acute exacerbation (HCC) 10/25/2022   COVID 05/16/2023   Depression    Drug addiction (HCC)    GERD (gastroesophageal reflux disease)    GIB (gastrointestinal bleeding) 08/19/2019   Heart murmur    Heme positive stool 08/14/2022   Homelessness    Hx of adenomatous colonic polyps    Hyperlipidemia    Hypertension    pt stated every once in a while BP will be high but has not been prescribed medication for HTN.    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) (HCC) 02/22/2016   SIRS (systemic inflammatory response syndrome) (HCC) 10/25/2022    Past Surgical History:  Procedure Laterality Date   A/V FISTULAGRAM Right 01/31/2023   Procedure: A/V Fistulagram;  Surgeon: Gretta Lonni PARAS, MD;  Location: [facility];  Service: Cardiovascular;  Laterality: Right;   AV FISTULA PLACEMENT Right 08/14/2022   Procedure: RIGHT ARM BRACHIOBASILIC ATERIOVENOUS FISTULA CREATION;  Surgeon: Magda Debby SAILOR, MD;  Location: [facility] OR;  Service: Vascular;  Laterality: Right;   BASCILIC VEIN TRANSPOSITION Right 11/13/2022   Procedure: RIGHT ARM SECOND STAGE BASILIC VEIN TRANSPOSITION;  Surgeon: Magda Debby SAILOR, MD;  Location: [facility] OR;  Service: Vascular;  Laterality: Right;   BIOPSY  01/01/2019   Procedure: BIOPSY;  Surgeon: Albertus Gordy CHRISTELLA, MD;  Location: [facility] ENDOSCOPY;  Service: Endoscopy;;   BIOPSY  11/17/2021   Procedure: BIOPSY;  Surgeon: Charlanne Groom, MD;  Location: [facility] ENDOSCOPY;  Service: Gastroenterology;;   CESAREAN SECTION  1989   COLONOSCOPY  11/07/2020   2018   CYSTOSCOPY W/ URETERAL STENT PLACEMENT Left 08/01/2018   Procedure: CYSTOSCOPY WITH RETROGRADE PYELOGRAM/URETERAL STENT PLACEMENT;  Surgeon: Carolee Sherwood JONETTA DOUGLAS, MD;  Location: [facility] ORS;  Service: Urology;  Laterality: Left;   CYSTOSCOPY WITH RETROGRADE PYELOGRAM, URETEROSCOPY AND STENT PLACEMENT Left 01/15/2019   Procedure: CYSTOSCOPY WITH RETROGRADE PYELOGRAM, URETEROSCOPY AND STENT PLACEMENT;  Surgeon: Carolee,  Sherwood JONETTA MOULD, MD;  Location: [facility] ORS;  Service: Urology;  Laterality: Left;   ENTEROSCOPY N/A 08/16/2022   Procedure: ENTEROSCOPY;  Surgeon: Wilhelmenia Aloha Raddle., MD;  Location: [facility] ENDOSCOPY;  Service: Gastroenterology;  Laterality: N/A;   ENTEROSCOPY N/A 10/31/2022   Procedure: ENTEROSCOPY;  Surgeon: Albertus Gordy HERO, MD;  Location: [facility] ENDOSCOPY;  Service: Gastroenterology;  Laterality: N/A;   ENTEROSCOPY  N/A 05/09/2023   Procedure: ENTEROSCOPY;  Surgeon: Federico Rosario BROCKS, MD;  Location: [facility] ENDOSCOPY;  Service: Gastroenterology;  Laterality: N/A;   ENTEROSCOPY N/A 09/09/2023   Procedure: ENTEROSCOPY;  Surgeon: Federico Rosario BROCKS, MD;  Location: [facility] ENDOSCOPY;  Service: Gastroenterology;  Laterality: N/A;   ENTEROSCOPY N/A 10/26/2023   Procedure: ENTEROSCOPY;  Surgeon: Albertus Gordy HERO, MD;  Location: [facility] ENDOSCOPY;  Service: Gastroenterology;  Laterality: N/A;   ESOPHAGOGASTRODUODENOSCOPY (EGD) WITH PROPOFOL  N/A 01/01/2019   Procedure: ESOPHAGOGASTRODUODENOSCOPY (EGD) WITH PROPOFOL ;  Surgeon: Albertus Gordy HERO, MD;  Location: [facility] ENDOSCOPY;  Service: Endoscopy;  Laterality: N/A;   ESOPHAGOGASTRODUODENOSCOPY (EGD) WITH PROPOFOL  N/A 08/21/2019   Procedure: ESOPHAGOGASTRODUODENOSCOPY (EGD) WITH PROPOFOL ;  Surgeon: Shila Gustav GAILS, MD;  Location: [facility] ENDOSCOPY;  Service: Endoscopy;  Laterality: N/A;   ESOPHAGOGASTRODUODENOSCOPY (EGD) WITH PROPOFOL  N/A 11/17/2021   Procedure: ESOPHAGOGASTRODUODENOSCOPY (EGD) WITH PROPOFOL ;  Surgeon: Charlanne Groom, MD;  Location: [facility] ENDOSCOPY;  Service: Gastroenterology;  Laterality: N/A;   FRACTURE SURGERY Left 2011   arm   HEMOSTASIS CLIP PLACEMENT  08/16/2022   Procedure: HEMOSTASIS CLIP PLACEMENT;  Surgeon: Wilhelmenia Aloha Raddle., MD;  Location: [facility] ENDOSCOPY;  Service: Gastroenterology;;   HOT HEMOSTASIS N/A 08/16/2022   Procedure: HOT HEMOSTASIS (ARGON PLASMA COAGULATION/BICAP);  Surgeon: Wilhelmenia Aloha Raddle., MD;  Location: [facility] ENDOSCOPY;  Service: Gastroenterology;  Laterality: N/A;   HOT HEMOSTASIS N/A 10/31/2022   Procedure: HOT HEMOSTASIS (ARGON PLASMA COAGULATION/BICAP);  Surgeon: Albertus Gordy HERO, MD;  Location: [facility] ENDOSCOPY;  Service: Gastroenterology;  Laterality: N/A;   HOT HEMOSTASIS N/A 05/09/2023   Procedure: HOT HEMOSTASIS (ARGON PLASMA COAGULATION/BICAP);  Surgeon: Federico Rosario BROCKS, MD;  Location: [facility] ENDOSCOPY;  Service: Gastroenterology;  Laterality: N/A;   HOT  HEMOSTASIS N/A 09/09/2023   Procedure: EGD, WITH ARGON PLASMA COAGULATION;  Surgeon: Federico Rosario BROCKS, MD;  Location: [facility] ENDOSCOPY;  Service: Gastroenterology;  Laterality: N/A;   INSERTION OF DIALYSIS CATHETER Right 08/14/2022   Procedure: INSERTION OF TUNNELED DIALYSIS CATHETER USING PALINDROME CATHETER KIT 19CM;  Surgeon: Magda Debby SAILOR, MD;  Location: [facility] OR;  Service: Vascular;  Laterality: Right;   IR THORACENTESIS ASP PLEURAL SPACE W/IMG GUIDE  09/15/2023   POLYPECTOMY  08/16/2022   Procedure: POLYPECTOMY;  Surgeon: Wilhelmenia Aloha Raddle., MD;  Location: [facility] ENDOSCOPY;  Service: Gastroenterology;;   SUBMUCOSAL TATTOO INJECTION  08/16/2022   Procedure: SUBMUCOSAL TATTOO INJECTION;  Surgeon: Wilhelmenia Aloha Raddle., MD;  Location: [facility] ENDOSCOPY;  Service: Gastroenterology;;   UPPER GASTROINTESTINAL ENDOSCOPY      No current facility-administered medications for this visit.   No current outpatient medications on file.   Facility-Administered Medications Ordered in Other Visits  Medication Dose Route Frequency Provider Last Rate Last Admin   0.9 %  sodium chloride  infusion (Manually program via Guardrails IV Fluids)   Intravenous Once Akula, Vijaya, MD       acetaminophen  (TYLENOL ) tablet 650 mg  650 mg Oral Q6H PRN Akula, Vijaya, MD   650 mg at 10/29/23 0555   albuterol  (PROVENTIL ) (2.5 MG/3ML) 0.083% nebulizer solution 3 mL  3 mL Inhalation Q4H PRN Cherlyn Labella, MD  ALPRAZolam  (XANAX ) tablet 0.25 mg  0.25 mg Oral BID PRN Akula, Vijaya, MD   0.25 mg at 10/28/23 2333   amLODipine  (NORVASC ) tablet 10 mg  10 mg Oral Daily Akula, Vijaya, MD   10 mg at 10/29/23 9147   atorvastatin  (LIPITOR) tablet 10 mg  10 mg Oral Daily Akula, Vijaya, MD   10 mg at 10/29/23 0854   bisacodyl  (DULCOLAX) suppository 10 mg  10 mg Rectal Daily Akula, Vijaya, MD   10 mg at 10/29/23 9144   budesonide -glycopyrrolate -formoterol  (BREZTRI ) 160-9-4.8 MCG/ACT inhaler 2 puff  2 puff Inhalation BID Akula, Vijaya, MD   2  puff at 10/29/23 0815   busPIRone  (BUSPAR ) tablet 10 mg  10 mg Oral TID Akula, Vijaya, MD   10 mg at 10/29/23 1755   carvedilol  (COREG ) tablet 6.25 mg  6.25 mg Oral BID WC Akula, Vijaya, MD   6.25 mg at 10/29/23 1756   Chlorhexidine  Gluconate Cloth 2 % PADS 6 each  6 each Topical Q0600 Akula, Vijaya, MD   6 each at 10/29/23 0556   dextromethorphan -guaiFENesin  (MUCINEX  DM) 30-600 MG per 12 hr tablet 1 tablet  1 tablet Oral BID Akula, Vijaya, MD   1 tablet at 10/29/23 0851   famotidine  (PEPCID ) tablet 20 mg  20 mg Oral QHS Akula, Vijaya, MD   20 mg at 10/28/23 2334   ferrous sulfate  tablet 325 mg  325 mg Oral Q breakfast Akula, Vijaya, MD   325 mg at 10/29/23 0854   furosemide  (LASIX ) 160 mg in dextrose  5 % 50 mL IVPB  160 mg Intravenous BID Akula, Vijaya, MD 66 mL/hr at 10/29/23 1513 160 mg at 10/29/23 1513   gabapentin  (NEURONTIN ) capsule 100 mg  100 mg Oral TID Akula, Vijaya, MD   100 mg at 10/29/23 1755   lactulose  (CHRONULAC ) 10 GM/15ML solution 30 g  30 g Oral TID Fairy Frames, MD   30 g at 10/29/23 1139   mupirocin  ointment (BACTROBAN ) 2 % 1 Application  1 Application Nasal BID Akula, Vijaya, MD   1 Application at 10/29/23 0850   nicotine  (NICODERM CQ  - dosed in mg/24 hours) patch 21 mg  21 mg Transdermal Daily Akula, Vijaya, MD   21 mg at 10/29/23 9144   nicotine  polacrilex (NICORETTE ) gum 2 mg  2 mg Oral PRN Akula, Vijaya, MD       ondansetron  (ZOFRAN ) injection 4 mg  4 mg Intravenous Q6H PRN Joseph, Preetha, MD   4 mg at 10/29/23 1415   pantoprazole  (PROTONIX ) EC tablet 40 mg  40 mg Oral BID AC Akula, Vijaya, MD   40 mg at 10/29/23 1755   senna-docusate (Senokot-S) tablet 2 tablet  2 tablet Oral BID Akula, Vijaya, MD   2 tablet at 10/29/23 0853   sodium bicarbonate  tablet 1,300 mg  1,300 mg Oral BID Akula, Vijaya, MD   1,300 mg at 10/29/23 0854   sodium chloride  flush (NS) 0.9 % injection 3 mL  3 mL Intravenous Q12H Akula, Vijaya, MD   3 mL at 10/28/23 2337   sucralfate  (CARAFATE ) 1  GM/10ML suspension 1 g  1 g Oral TID WC & HS Akula, Vijaya, MD   1 g at 10/29/23 1756   traMADol  (ULTRAM ) tablet 50 mg  50 mg Oral Q12H PRN Akula, Vijaya, MD   50 mg at 10/28/23 2333   traZODone  (DESYREL ) tablet 50 mg  50 mg Oral QHS PRN Akula, Vijaya, MD   50 mg at 10/28/23 2336    Allergies as  of 10/30/2023 - Review Complete 10/26/2023  Allergen Reaction Noted   Zestril  [lisinopril ] Other (See Comments) 07/11/2016    Family History  Problem Relation Age of Onset   Diabetes Father    Colon cancer Neg Hx    Esophageal cancer Neg Hx    Rectal cancer Neg Hx    Stomach cancer Neg Hx     Social History   Tobacco Use   Smoking status: Every Day    Current packs/day: 1.00    Types: Cigarettes    Passive exposure: Past   Smokeless tobacco: Never  Vaping Use   Vaping status: Never Used  Substance Use Topics   Alcohol  use: Not Currently    Comment: 1 month PTA   Drug use: Yes    Types: Marijuana     Review of Systems:    Constitutional: No weight loss, fever, chills, weakness or fatigue Eyes: No change in vision Ears, Nose, Throat:  No change in hearing or congestion Skin: No rash or itching Cardiovascular: No chest pain, chest pressure or palpitations   Respiratory: No SOB or cough Gastrointestinal: See HPI and otherwise negative Genitourinary: No dysuria or change in urinary frequency Neurological: No headache, dizziness or syncope Musculoskeletal: No new muscle or joint pain Hematologic: No bleeding or bruising    Physical Exam:  Vital signs: There were no vitals taken for this visit.  Constitutional: NAD, Well developed, Well nourished, alert and cooperative Head:  Normocephalic and atraumatic.  Eyes: No scleral icterus. Conjunctiva pink. Mouth: No oral lesions. Respiratory: Respirations even and unlabored. Lungs clear to auscultation bilaterally.  No wheezes, crackles, or rhonchi.  Cardiovascular:  Regular rate and rhythm. No murmurs. No peripheral  edema. Gastrointestinal:  Soft, nondistended, nontender. No rebound or guarding. Normal bowel sounds. No appreciable masses or hepatomegaly. Rectal:  Not performed.  Neurologic:  Alert and oriented x4;  grossly normal neurologically.  Skin:   Dry and intact without significant lesions or rashes. Psychiatric: Oriented to person, place and time. Demonstrates good judgement and reason without abnormal affect or behaviors.   RELEVANT LABS AND IMAGING: CBC    Component Value Date/Time   WBC 6.6 10/29/2023 0302   RBC 2.32 (L) 10/29/2023 0302   HGB 9.0 (L) 10/29/2023 1845   HGB 6.7 (LL) 06/11/2023 1218   HCT 28.0 (L) 10/29/2023 1845   HCT 20.4 (L) 06/11/2023 1218   PLT 313 10/29/2023 0302   PLT 294 06/11/2023 1218   MCV 91.8 10/29/2023 0302   MCV 96 06/11/2023 1218   MCH 29.3 10/29/2023 0302   MCHC 31.9 10/29/2023 0302   RDW 19.5 (H) 10/29/2023 0302   RDW 16.9 (H) 06/11/2023 1218   LYMPHSABS 1.3 10/29/2023 0302   LYMPHSABS 1.1 06/11/2023 1218   MONOABS 1.0 10/29/2023 0302   EOSABS 0.3 10/29/2023 0302   EOSABS 0.2 06/11/2023 1218   BASOSABS 0.0 10/29/2023 0302   BASOSABS 0.0 06/11/2023 1218    CMP     Component Value Date/Time   NA 132 (L) 10/29/2023 0302   NA 137 06/06/2022 1410   K 3.8 10/29/2023 0302   CL 95 (L) 10/29/2023 0302   CO2 22 10/29/2023 0302   GLUCOSE 113 (H) 10/29/2023 0302   BUN 53 (H) 10/29/2023 0302   BUN 37 (H) 06/06/2022 1410   CREATININE 3.49 (H) 10/29/2023 0302   CREATININE 1.64 (H) 04/29/2016 1047   CALCIUM  8.2 (L) 10/29/2023 0302   CALCIUM  7.7 (L) 06/14/2023 0517   PROT 6.7 10/22/2023 2351   PROT  7.6 06/06/2022 1410   ALBUMIN  2.4 (L) 10/28/2023 0924   ALBUMIN  3.9 06/06/2022 1410   AST 18 10/22/2023 2351   ALT 12 10/22/2023 2351   ALKPHOS 83 10/22/2023 2351   BILITOT 0.6 10/22/2023 2351   BILITOT <0.2 06/06/2022 1410   GFRNONAA 14 (L) 10/29/2023 0302   GFRNONAA 35 (L) 04/29/2016 1047   GFRAA 36 (L) 08/31/2019 1230   GFRAA 40 (L) 04/29/2016  1047     Assessment/Plan:       Camie Furbish, PA-C [facility] 10/29/2023, 8:06 PM  Patient Care Team: Pcp, No as PCP - [facility] Delene Thersia JINNY georgann DESIREE Care Management

## 2023-10-29 NOTE — Progress Notes (Signed)
 Admit: 10/22/2023 LOS: 6  26F ESRD here with progressive dysnpea, hypoxia, and AoC anemia.   Subjective:  Sleeping in bed this AM.  No big issues.    08/12 0701 - 08/13 0700 In: 69 [I.V.:3; IV Piggyback:66] Out: 1900   Filed Weights   10/28/23 0918 10/28/23 1215 10/29/23 0503  Weight: 54 kg (P) 53 kg 51.6 kg    Scheduled Meds:  sodium chloride    Intravenous Once   amLODipine   10 mg Oral Daily   atorvastatin   10 mg Oral Daily   bisacodyl   10 mg Rectal Daily   budesonide -glycopyrrolate -formoterol   2 puff Inhalation BID   busPIRone   10 mg Oral TID   carvedilol   6.25 mg Oral BID WC   Chlorhexidine  Gluconate Cloth  6 each Topical Q0600   dextromethorphan -guaiFENesin   1 tablet Oral BID   famotidine   20 mg Oral QHS   ferrous sulfate   325 mg Oral Q breakfast   gabapentin   100 mg Oral TID    HYDROmorphone  (DILAUDID ) injection  0.5 mg Intravenous Once   lactulose   30 g Oral TID   milk and molasses  1 enema Rectal Once   mupirocin  ointment  1 Application Nasal BID   nicotine   21 mg Transdermal Daily   pantoprazole   40 mg Oral BID AC   senna-docusate  2 tablet Oral BID   sodium bicarbonate   1,300 mg Oral BID   sodium chloride  flush  3 mL Intravenous Q12H   sucralfate   1 g Oral TID WC & HS   Continuous Infusions:  furosemide  160 mg (10/29/23 0901)   PRN Meds:.acetaminophen , albuterol , ALPRAZolam , nicotine  polacrilex, traMADol , traZODone   Current Labs: reviewed    Physical Exam:  Blood pressure 134/66, pulse 67, temperature 98 F (36.7 C), temperature source Oral, resp. rate 12, height 5' 7 (1.702 m), weight 51.6 kg, SpO2 94%. GEN: chronically ill appearing  ENT: NCAT EYES: EOMI CV: regular, no rub PULM: nl WOB. CTAB, diminished in bases ABD: s/nt/n SKIN: no rashes/lesions EXT: No sig LEE VASC: RUE AVF  +b/t  A ESRD: some residual RRF. Uses RUE AVF.  HD cont on THS schedule at this time.  Nonadherent and comes to hospital for HD.  Not historically a candidate for outpt  CLIP.  She tells me that she is unwilling to go to a SNF.   AHRF / DYspnea: from #3 and hypervolemia: Cont UF as able; now on room air  Next HD 10/30/23 AoC Anemia, hx/o GIB for GI eval; ESA qThursday given 8/7.  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  Recent Labs  Lab 10/27/23 1046 10/28/23 0350 10/29/23 0302  WBC 5.9 8.0 6.6  NEUTROABS 3.4 5.1 4.0  HGB 8.5* 7.8* 6.8*  HCT 25.5* 23.9* 21.3*  MCV 90.4 91.6 91.8  PLT 306 314 313

## 2023-10-29 NOTE — Progress Notes (Signed)
 Mobility Specialist Progress Note:   10/29/23 0926  Mobility  Activity Ambulated with assistance  Level of Assistance Contact guard assist, steadying assist  Assistive Device Front wheel walker  Distance Ambulated (ft) 200 ft  Activity Response Tolerated well  Mobility Referral Yes  Mobility visit 1 Mobility  Mobility Specialist Start Time (ACUTE ONLY) [id number]  Mobility Specialist Stop Time (ACUTE ONLY) [id number]  Mobility Specialist Time Calculation (min) (ACUTE ONLY) 12 min   Pt received in bed, agreeable to mobility session after encouragement. Tolerated well, audible SOB, c/o fatigue, stomach cramping. CGA with RW for safety. Returned pt to room, sitting in bathroom. Encouraged to pull call light when finished. Nsg notified.    Dmitry Macomber Mobility Specialist Please contact via Special educational needs teacher or  Rehab office at [phone]

## 2023-10-29 NOTE — Plan of Care (Signed)
   Problem: Health Behavior/Discharge Planning: Goal: Ability to manage health-related needs will improve Outcome: Not Progressing   Problem: Clinical Measurements: Goal: Will remain free from infection Outcome: Not Progressing

## 2023-10-29 NOTE — Plan of Care (Signed)
   Problem: Clinical Measurements: Goal: Will remain free from infection Outcome: Progressing   Problem: Health Behavior/Discharge Planning: Goal: Ability to manage health-related needs will improve Outcome: Not Progressing

## 2023-10-29 NOTE — Progress Notes (Signed)
 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  Reluctant to downgrade diet  Assessment and Plan:  Acute hypoxic respiratory failure secondary to volume overload/acute on chronic congestive heart failure - Volume overload/ESRD -Improved with BiPAP, HD    Acute on chronic anemia/ Duodenal AVMs -known history of AVM, hemoglobin 2 weeks ago was 9.4, she presented with a 5.1.   Patient received a total of 3 units of prbc transfusion, hemoglbin stable around 8.  She underwent enteroscopy showing - LA Grade A reflux esophagitis with no bleeding,  Erythematous mucosa in the stomach,  Four angioectasias in the duodenum, one with evidence of recent bleeding. Treated with argon plasma coagulation ( APC). Monitor hemoglobin.  Continue with PPI BID and pepcid  and sucralfate  - Transfuse 1 unit PRBC today, check anemia panel with a.m. labs  Severe constipation, ileus - KUB with severe stool burden, abdominal distention noted on exam, downgrade diet to clears, add enema and lactulose  3 times daily   ACS ruled out/demand ischemia ruled in:  Troponins were checked which were in 20s and flat indicative of demand ischemia.   No chest pain.  Elevated D-dimer but CTA  negative for PE.   Hyponatremia, mild and chronic, likely from hypervolemia.   Qtc prolongation - 511 -- Avoid QT prolonging meds    Social determinants: Homelessness, TOC following, does not seem to have any tangible solutions   COPD/chronic cough:  no wheezing heard.  Nebs prn, continue home Breztri .  Robitussin as needed.   Hypertension:  Well controlled. Continue with norvasc  10 mg daily, coreg  6.25 mg BID, hydralazine  prn.    HLD: Continue home Atorvastatin     Polysubstance / cocaine use: Check UDS   Smoking: on nicotine  patch.    Mood disorder:  Continue home Buspar .    Chronic pain: Continue home Gabapentin ,  DVT prophylaxis: SCDs Code Status: Full code Family Communication:none Disposition Plan: No Dispo options    Objective: Vitals:   10/29/23 1114 10/29/23 1130 10/29/23 1136 10/29/23 1149  BP: (!) 122/59 128/62  134/66  Pulse: 66 65 67   Resp: 12 11 12    Temp: 98.1 F (36.7 C) 98.2 F (36.8 C) 98.2 F (36.8 C) 98 F (36.7 C)  TempSrc: Oral Oral Oral Oral  SpO2: 92% 92% 94%   Weight:      Height:        Intake/Output Summary (Last 24 hours) at 10/29/2023 1434 Last data filed at 10/29/2023 0800 Gross per 24 hour  Intake 309 ml  Output --  Net 309 ml   Filed Weights   10/28/23 0918 10/28/23 1215 10/29/23 0503  Weight: 54 kg (P) 53 kg 51.6 kg    Examination:  General exam: Appears calm and comfortable  Respiratory system: Clear to auscultation  Cardiovascular system: S1 & S2 heard, RRR.  Abd: Firm, distended, increased bowel sounds Central nervous system: Alert and oriented. No focal neurological deficits. Extremities: no edema Skin: No rashes Psychiatry:  Mood & affect appropriate.     Data Reviewed:   CBC: Recent Labs  Lab 10/25/23 0830 10/25/23 0906 10/25/23 1823 10/26/23 0347 10/27/23 1046 10/28/23 0350 10/29/23 0302  WBC 6.1  --   --  7.1 5.9 8.0 6.6  NEUTROABS 3.8  --   --  4.7 3.4 5.1 4.0  HGB 6.8*   < > 8.8* 8.7* 8.5*  7.8* 6.8*  HCT 20.5*   < > 25.8* 25.6* 25.5* 23.9* 21.3*  MCV 91.5  --   --  87.7 90.4 91.6 91.8  PLT 304  --   --  281 306 314 313   < > = values in this interval not displayed.   Basic Metabolic Panel: Recent Labs  Lab 10/25/23 0830 10/26/23 0347 10/27/23 1046 10/28/23 0924 10/29/23 0302  NA 133* 132* 132* 127* 132*  K 3.0* 3.7 4.2 4.0 3.8  CL 107 97* 99 93* 95*  CO2 15* 24 19* 18* 22  GLUCOSE 91 107* 107* 119* 113*  BUN 61* 46* 64* 72* 53*  CREATININE 3.29* 3.36* 3.97* 4.25* 3.49*  CALCIUM  6.5* 8.1* 8.4* 8.0* 8.2*  PHOS 3.6  --   --  3.5  --    GFR: Estimated Creatinine Clearance: 13.4 mL/min (A) (by C-G formula based on SCr of 3.49 mg/dL (H)). Liver Function Tests: Recent Labs  Lab 10/22/23 2351 10/25/23 0830 10/28/23 0924  AST 18  --   --   ALT 12  --   --   ALKPHOS 83  --   --   BILITOT 0.6  --   --   PROT 6.7  --   --   ALBUMIN  3.0* 2.2* 2.4*   No results for input(s): LIPASE, AMYLASE in the last 168 hours. No results for input(s): AMMONIA in the last 168 hours. Coagulation Profile: No results for input(s): INR, PROTIME in the last 168 hours. Cardiac Enzymes: No results for input(s): CKTOTAL, CKMB, CKMBINDEX, TROPONINI in the last 168 hours. BNP (last 3 results) No results for input(s): PROBNP in the last 8760 hours. HbA1C: No results for input(s): HGBA1C in the last 72 hours. CBG: Recent Labs  Lab 10/26/23 2006 10/26/23 2357 10/27/23 0400  GLUCAP 106* 101* 108*   Lipid Profile: No results for input(s): CHOL, HDL, LDLCALC, TRIG, CHOLHDL, LDLDIRECT in the last 72 hours. Thyroid Function Tests: No results for input(s): TSH, T4TOTAL, FREET4, T3FREE, THYROIDAB in the last 72 hours. Anemia Panel: No results for input(s): VITAMINB12, FOLATE, FERRITIN, TIBC, IRON , RETICCTPCT in the last 72 hours. Urine analysis:    Component Value Date/Time   COLORURINE YELLOW 09/20/2023 0831   APPEARANCEUR HAZY  (A) 09/20/2023 0831   LABSPEC 1.005 09/20/2023 0831   PHURINE 7.0 09/20/2023 0831   GLUCOSEU NEGATIVE 09/20/2023 0831   HGBUR SMALL (A) 09/20/2023 0831   BILIRUBINUR NEGATIVE 09/20/2023 0831   KETONESUR NEGATIVE 09/20/2023 0831   PROTEINUR 100 (A) 09/20/2023 0831   UROBILINOGEN 0.2 03/02/2010 2027   NITRITE NEGATIVE 09/20/2023 0831   LEUKOCYTESUR LARGE (A) 09/20/2023 0831   Sepsis Labs: @LABRCNTIP (procalcitonin:4,lacticidven:4)  ) Recent Results (from the past 240 hours)  Resp panel by RT-PCR (RSV, Flu A&B, Covid) Anterior Nasal Swab     Status: None   Collection Time: 10/22/23 11:51 PM   Specimen: Anterior Nasal Swab  Result Value Ref  Range Status   SARS Coronavirus 2 by RT PCR NEGATIVE NEGATIVE Final   Influenza A by PCR NEGATIVE NEGATIVE Final   Influenza B by PCR NEGATIVE NEGATIVE Final    Comment: (NOTE) The Xpert Xpress SARS-CoV-2/FLU/RSV plus assay is intended as an aid in the diagnosis of influenza from Nasopharyngeal swab specimens and should not be used as a sole basis for treatment. Nasal washings and aspirates are unacceptable for Xpert Xpress SARS-CoV-2/FLU/RSV testing.  Fact Sheet for Patients: BloggerCourse.com  Fact Sheet for Healthcare Providers: SeriousBroker.it  This test is not yet approved or cleared by the [place]  FDA and has been authorized for detection and/or diagnosis of SARS-CoV-2 by FDA under an Emergency Use Authorization (EUA). This EUA will remain in effect (meaning this test can be used) for the duration of the COVID-19 declaration under Section 564(b)(1) of the Act, 21 U.S.C. section 360bbb-3(b)(1), unless the authorization is terminated or revoked.     Resp Syncytial Virus by PCR NEGATIVE NEGATIVE Final    Comment: (NOTE) Fact Sheet for Patients: BloggerCourse.com  Fact Sheet for Healthcare Providers: SeriousBroker.it  This  test is not yet approved or cleared by the [place]  FDA and has been authorized for detection and/or diagnosis of SARS-CoV-2 by FDA under an Emergency Use Authorization (EUA). This EUA will remain in effect (meaning this test can be used) for the duration of the COVID-19 declaration under Section 564(b)(1) of the Act, 21 U.S.C. section 360bbb-3(b)(1), unless the authorization is terminated or revoked.  Performed at [facility], [address], [place], [place] [address]   MRSA Next Gen by PCR, Nasal     Status: Abnormal   Collection Time: 10/23/23  4:23 PM   Specimen: Nasal Mucosa; Nasal Swab  Result Value Ref Range Status   MRSA by PCR Next Gen DETECTED (A) NOT DETECTED Final    Comment: RESULT CALLED TO, READ BACK BY AND VERIFIED WITH: RN ROXIE HUNT [phone] AT 1826, ADC (NOTE) The GeneXpert MRSA Assay (FDA approved for NASAL specimens only), is one component of a comprehensive MRSA colonization surveillance program. It is not intended to diagnose MRSA infection nor to guide or monitor treatment for MRSA infections. Test performance is not FDA approved in patients less than 67 years old. Performed at [facility], [address] [place], [place] [address]      Radiology Studies: DG Abd 1 View Result Date: 10/28/2023 CLINICAL DATA:  Constipation skip EXAM: ABDOMEN - 1 VIEW COMPARISON:  None Available. FINDINGS: No dilated bowel loops are seen. There is diffuse gaseous distension of small bowel. There is a large amount of stool throughout the colon. There are phleboliths in the pelvis. Small pleural effusions are present. Stomach is nondilated. No acute osseous abnormality. Degenerative changes affect the lumbar spine. IMPRESSION: 1. Large amount of stool throughout the colon. 2. Diffuse gaseous distension of small bowel. 3. Small pleural effusions. Electronically Signed   By: Greig Pique M.D.   On: 10/28/2023 23:20     Scheduled Meds:  sodium chloride     Intravenous Once   amLODipine   10 mg Oral Daily   atorvastatin   10 mg Oral Daily   bisacodyl   10 mg Rectal Daily   budesonide -glycopyrrolate -formoterol   2 puff Inhalation BID   busPIRone   10 mg Oral TID   carvedilol   6.25 mg Oral BID WC   Chlorhexidine  Gluconate Cloth  6 each Topical Q0600   dextromethorphan -guaiFENesin   1 tablet Oral BID   famotidine   20 mg  Oral QHS   ferrous sulfate   325 mg Oral Q breakfast   gabapentin   100 mg Oral TID    HYDROmorphone  (DILAUDID ) injection  0.5 mg Intravenous Once   lactulose   30 g Oral TID   mupirocin  ointment  1 Application Nasal BID   nicotine   21 mg Transdermal Daily   pantoprazole   40 mg Oral BID AC   senna-docusate  2 tablet Oral BID   sodium bicarbonate   1,300 mg Oral BID   sodium chloride  flush  3 mL Intravenous Q12H   sucralfate   1 g Oral TID WC & HS   Continuous Infusions:  furosemide  160 mg (10/29/23 0901)     LOS: 6 days    Time spent:    Sigurd Pac, MD [facility]   10/29/2023, 2:34 PM

## 2023-10-30 ENCOUNTER — Ambulatory Visit: Payer: Self-pay | Admitting: Gastroenterology

## 2023-10-30 LAB — CBC
HCT: 29.4 % — ABNORMAL LOW (ref 36.0–46.0)
Hemoglobin: 9.7 g/dL — ABNORMAL LOW (ref 12.0–15.0)
MCH: 29.1 pg (ref 26.0–34.0)
MCHC: 33 g/dL (ref 30.0–36.0)
MCV: 88.3 fL (ref 80.0–100.0)
Platelets: 366 K/uL (ref 150–400)
RBC: 3.33 MIL/uL — ABNORMAL LOW (ref 3.87–5.11)
RDW: 19.9 % — ABNORMAL HIGH (ref 11.5–15.5)
WBC: 5.2 K/uL (ref 4.0–10.5)
nRBC: 0 % (ref 0.0–0.2)

## 2023-10-30 LAB — TYPE AND SCREEN
ABO/RH(D): A POS
Antibody Screen: NEGATIVE
Unit division: 0

## 2023-10-30 LAB — VITAMIN B12: Vitamin B-12: 376 pg/mL (ref 180–914)

## 2023-10-30 LAB — FOLATE: Folate: 13.7 ng/mL (ref >5.9)

## 2023-10-30 LAB — BPAM RBC
Blood Product Expiration Date: 202509072359
ISSUE DATE / TIME: 202508131119
Unit Type and Rh: 6200

## 2023-10-30 LAB — IRON AND TIBC
Iron: 40 ug/dL (ref 28–170)
Saturation Ratios: 15 % (ref 10.4–31.8)
TIBC: 274 ug/dL (ref 250–450)
UIBC: 234 ug/dL

## 2023-10-30 LAB — BASIC METABOLIC PANEL WITH GFR
Anion gap: 16 — ABNORMAL HIGH (ref 5–15)
BUN: 57 mg/dL — ABNORMAL HIGH (ref 8–23)
CO2: 20 mmol/L — ABNORMAL LOW (ref 22–32)
Calcium: 8.5 mg/dL — ABNORMAL LOW (ref 8.9–10.3)
Chloride: 94 mmol/L — ABNORMAL LOW (ref 98–111)
Creatinine, Ser: 3.8 mg/dL — ABNORMAL HIGH (ref 0.44–1.00)
GFR, Estimated: 13 mL/min — ABNORMAL LOW (ref >60)
Glucose, Bld: 82 mg/dL (ref 70–99)
Potassium: 4.1 mmol/L (ref 3.5–5.1)
Sodium: 130 mmol/L — ABNORMAL LOW (ref 135–145)

## 2023-10-30 LAB — RETICULOCYTES
Immature Retic Fract: 28.6 % — ABNORMAL HIGH (ref 2.3–15.9)
RBC.: 3.34 MIL/uL — ABNORMAL LOW (ref 3.87–5.11)
Retic Count, Absolute: 120.6 K/uL (ref 19.0–186.0)
Retic Ct Pct: 3.6 % — ABNORMAL HIGH (ref 0.4–3.1)

## 2023-10-30 LAB — FERRITIN: Ferritin: 475 ng/mL — ABNORMAL HIGH (ref 11–307)

## 2023-10-30 MED ORDER — DARBEPOETIN ALFA 60 MCG/0.3ML IJ SOSY
60.0000 ug | PREFILLED_SYRINGE | INTRAMUSCULAR | Status: DC
  Administered 2023-10-31: 60 ug via SUBCUTANEOUS
  Filled 2023-10-30 (×3): qty 0.3

## 2023-10-30 NOTE — Progress Notes (Signed)
 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  Reluctant to downgrade diet  Assessment and Plan:  Acute hypoxic respiratory failure secondary to volume overload/acute on chronic congestive heart failure - Volume overload/ESRD -Improved with BiPAP, HD    Acute on chronic anemia/ Duodenal AVMs -known history of AVM, hemoglobin 2 weeks ago was 9.4, she presented with a 5.1.   Patient received a total of 3 units of prbc transfusion, hemoglbin stable around 8.  She underwent enteroscopy showing - LA Grade A reflux esophagitis with no bleeding,  Erythematous mucosa in the stomach,  Four angioectasias in the duodenum, one with evidence of recent bleeding. Treated with argon plasma coagulation ( APC). Monitor hemoglobin.  Continue with PPI BID and pepcid  and sucralfate  - Transfused 1 unit PRBC yesterday, hemoglobin improved, anemia panel suggestive of chronic disease and iron  deficiency  Severe constipation, ileus - KUB with severe stool burden, abdominal distention noted on exam,  - Started having BMs after lactulose  and enema yesterday -Continue lactulose  today   Elevated troponin Troponins were checked which were in 20s and flat indicative of demand  ischemia.   No chest pain.    Hyponatremia, mild and chronic, likely from hypervolemia.   Qtc prolongation - 511 -- Avoid QT prolonging meds    Social determinants: Homelessness, TOC following, does not seem to have any tangible solutions   COPD/chronic cough:  no wheezing heard.  Nebs prn, continue home Breztri .  Robitussin as needed.   Hypertension:  Well controlled. Continue with norvasc  10 mg daily, coreg  6.25 mg BID, hydralazine  prn.    HLD: Continue home Atorvastatin     Polysubstance / cocaine use: Check UDS   Smoking: on nicotine  patch.    Mood disorder:  Continue home Buspar .    Chronic pain: Continue home Gabapentin ,  DVT prophylaxis: SCDs Code Status: Full code Family Communication:none Disposition Plan: No Dispo options    Objective: Vitals:   10/30/23 1153 10/30/23 1159 10/30/23 1215 10/30/23 1217  BP: (!) 149/108 (!) 149/100  (!) 154/64  Pulse: 82 90 87 91  Resp: 19 14 19 18   Temp: 98.8 F (37.1 C) 98.8 F (37.1 C)    TempSrc:      SpO2: 93% 94% 97% 92%  Weight:      Height:        Intake/Output Summary (Last 24 hours) at 10/30/2023 1302 Last data filed at 10/30/2023 1159 Gross per 24 hour  Intake 132 ml  Output 1700 ml  Net -1568 ml   Filed Weights   10/29/23 0503 10/30/23 0301 10/30/23 0829  Weight: 51.6 kg 52.4 kg 52.4 kg    Examination:  General exam: Appears calm and comfortable  Respiratory system: Clear to auscultation Cardiovascular system: S1 & S2 heard, RRR.  Abd:  Distended, less tense than yesterday, positive bowel sounds Central nervous system: Alert and oriented. No focal neurological deficits. Extremities: no edema Skin: No rashes Psychiatry:  Mood & affect appropriate.     Data Reviewed:   CBC: Recent Labs  Lab 10/25/23 0830 10/25/23 0906 10/26/23 0347 10/27/23 1046 10/28/23 0350 10/29/23 0302 10/29/23 1845 10/30/23 0324  WBC 6.1  --  7.1 5.9 8.0 6.6  --  5.2  NEUTROABS 3.8  --  4.7 3.4 5.1 4.0  --   --    HGB 6.8*   < > 8.7* 8.5* 7.8* 6.8* 9.0* 9.7*  HCT 20.5*   < > 25.6* 25.5* 23.9* 21.3* 28.0* 29.4*  MCV 91.5  --  87.7 90.4 91.6 91.8  --  88.3  PLT 304  --  281 306 314 313  --  366   < > = values in this interval not displayed.   Basic Metabolic Panel: Recent Labs  Lab 10/25/23 0830 10/26/23 0347 10/27/23 1046 10/28/23 0924 10/29/23 0302 10/30/23 0324  NA 133* 132* 132* 127* 132* 130*  K 3.0* 3.7 4.2 4.0 3.8 4.1  CL 107 97* 99 93* 95* 94*  CO2 15* 24 19* 18* 22 20*  GLUCOSE 91 107* 107* 119* 113* 82  BUN 61* 46* 64* 72* 53* 57*  CREATININE 3.29* 3.36* 3.97* 4.25* 3.49* 3.80*  CALCIUM  6.5* 8.1* 8.4* 8.0* 8.2* 8.5*  PHOS 3.6  --   --  3.5  --   --    GFR: Estimated Creatinine Clearance: 12.5 mL/min (A) (by C-G formula based on SCr of 3.8 mg/dL (H)). Liver Function Tests: Recent Labs  Lab 10/25/23 0830 10/28/23 0924  ALBUMIN  2.2* 2.4*   No results for input(s): LIPASE, AMYLASE in the last 168 hours. No results for input(s): AMMONIA in the last 168 hours. Coagulation Profile: No results for input(s): INR, PROTIME in the last 168 hours. Cardiac Enzymes: No results for input(s): CKTOTAL, CKMB, CKMBINDEX, TROPONINI in the last 168 hours. BNP (last 3 results) No results for input(s): PROBNP in the last 8760 hours. HbA1C: No results for input(s): HGBA1C in the last 72 hours. CBG: Recent Labs  Lab 10/26/23 2006 10/26/23 2357 10/27/23 0400  GLUCAP 106* 101* 108*   Lipid Profile: No results for input(s): CHOL, HDL, LDLCALC, TRIG, CHOLHDL, LDLDIRECT in the last 72 hours. Thyroid Function Tests: No results for input(s): TSH, T4TOTAL, FREET4, T3FREE, THYROIDAB in the last 72 hours. Anemia Panel: Recent Labs    10/30/23 0324  VITAMINB12 376  FOLATE 13.7  FERRITIN 475*  TIBC 274  IRON  40  RETICCTPCT 3.6*   Urine analysis:    Component Value Date/Time   COLORURINE YELLOW 09/20/2023 0831   APPEARANCEUR HAZY (A)  09/20/2023 0831   LABSPEC 1.005 09/20/2023 0831   PHURINE 7.0 09/20/2023 0831   GLUCOSEU NEGATIVE 09/20/2023 0831   HGBUR SMALL (A) 09/20/2023 0831   BILIRUBINUR NEGATIVE 09/20/2023 0831   KETONESUR NEGATIVE 09/20/2023 0831   PROTEINUR 100 (A) 09/20/2023 0831   UROBILINOGEN 0.2 03/02/2010 2027   NITRITE NEGATIVE 09/20/2023 0831   LEUKOCYTESUR LARGE (A) 09/20/2023 0831   Sepsis Labs: @LABRCNTIP (procalcitonin:4,lacticidven:4)  ) Recent Results (from the past 240 hours)  Resp panel by RT-PCR (RSV, Flu A&B, Covid) Anterior Nasal Swab     Status: None   Collection Time: 10/22/23 11:51 PM   Specimen: Anterior Nasal Swab  Result Value Ref Range Status   SARS Coronavirus 2 by RT PCR NEGATIVE NEGATIVE Final   Influenza A by PCR  NEGATIVE NEGATIVE Final   Influenza B by PCR NEGATIVE NEGATIVE Final    Comment: (NOTE) The Xpert Xpress SARS-CoV-2/FLU/RSV plus assay is intended as an aid in the diagnosis of influenza from Nasopharyngeal swab specimens and should not be used as a sole basis for treatment. Nasal washings and aspirates are unacceptable for Xpert Xpress SARS-CoV-2/FLU/RSV testing.  Fact Sheet for Patients: BloggerCourse.com  Fact Sheet for Healthcare Providers: SeriousBroker.it  This test is not yet approved or cleared by the [place]  FDA and has been authorized for detection and/or diagnosis of SARS-CoV-2 by FDA under an Emergency Use Authorization (EUA). This EUA will remain in effect (meaning this test can be used) for the duration of the COVID-19 declaration under Section 564(b)(1) of the Act, 21 U.S.C. section 360bbb-3(b)(1), unless the authorization is terminated or revoked.     Resp Syncytial Virus by PCR NEGATIVE NEGATIVE Final    Comment: (NOTE) Fact Sheet for Patients: BloggerCourse.com  Fact Sheet for Healthcare Providers: SeriousBroker.it  This test is  not yet approved or cleared by the [place]  FDA and has been authorized for detection and/or diagnosis of SARS-CoV-2 by FDA under an Emergency Use Authorization (EUA). This EUA will remain in effect (meaning this test can be used) for the duration of the COVID-19 declaration under Section 564(b)(1) of the Act, 21 U.S.C. section 360bbb-3(b)(1), unless the authorization is terminated or revoked.  Performed at [facility], [address] [place], [place] [address]   MRSA Next Gen by PCR, Nasal     Status: Abnormal   Collection Time: 10/23/23  4:23 PM   Specimen: Nasal Mucosa; Nasal Swab  Result Value Ref Range Status   MRSA by PCR Next Gen DETECTED (A) NOT DETECTED Final    Comment: RESULT CALLED TO, READ BACK BY AND VERIFIED WITH: RN ROXIE HUNT [phone] AT 1826, ADC (NOTE) The GeneXpert MRSA Assay (FDA approved for NASAL specimens only), is one component of a comprehensive MRSA colonization surveillance program. It is not intended to diagnose MRSA infection nor to guide or monitor treatment for MRSA infections. Test performance is not FDA approved in patients less than 76 years old. Performed at [facility], [address], [place], [place] [address]      Radiology Studies: DG Abd 1 View Result Date: 10/28/2023 CLINICAL DATA:  Constipation skip EXAM: ABDOMEN - 1 VIEW COMPARISON:  None Available. FINDINGS: No dilated bowel loops are seen. There is diffuse gaseous distension of small bowel. There is a large amount of stool throughout the colon. There are phleboliths in the pelvis. Small pleural effusions are present. Stomach is nondilated. No acute osseous abnormality. Degenerative changes affect the lumbar spine. IMPRESSION: 1. Large amount of stool throughout the colon. 2. Diffuse gaseous distension of small bowel. 3. Small pleural effusions. Electronically Signed   By: Greig Pique M.D.   On: 10/28/2023 23:20     Scheduled Meds:  sodium chloride    Intravenous  Once   amLODipine   10 mg Oral Daily   atorvastatin   10 mg Oral Daily   bisacodyl   10 mg Rectal Daily   budesonide -glycopyrrolate -formoterol   2 puff Inhalation BID   busPIRone   10 mg Oral TID   carvedilol   6.25 mg Oral BID WC   Chlorhexidine  Gluconate Cloth  6 each Topical Q0600   darbepoetin (ARANESP ) injection - DIALYSIS  60 mcg Subcutaneous Q Thu-1800   dextromethorphan -guaiFENesin   1 tablet Oral BID   famotidine   20 mg Oral QHS   ferrous sulfate   325 mg Oral Q breakfast   gabapentin   100 mg Oral TID   lactulose   30 g Oral TID   mupirocin  ointment  1 Application Nasal BID   nicotine   21 mg Transdermal Daily   pantoprazole   40 mg Oral BID AC   senna-docusate  2 tablet Oral BID   sodium bicarbonate   1,300 mg Oral BID   sodium chloride  flush  3 mL Intravenous Q12H   sucralfate   1 g Oral TID WC & HS   Continuous Infusions:  furosemide  160 mg (10/29/23 1513)     LOS: 7 days    Time spent:    Sigurd Pac, MD [facility]   10/30/2023, 1:02 PM

## 2023-10-30 NOTE — Progress Notes (Signed)
 Admit: 10/22/2023 LOS: 7  77F ESRD here with progressive dysnpea, hypoxia, and AoC anemia.   Subjective:  Still having abd bloating/ cramping.  Had some loose stools yesterday.  Got 1 u pRBCS yesterday.    08/13 0701 - 08/14 0700 In: 372 [P.O.:240; IV Piggyback:132] Out: -   Filed Weights   10/29/23 0503 10/30/23 0301 10/30/23 0829  Weight: 51.6 kg 52.4 kg 52.4 kg    Scheduled Meds:  sodium chloride    Intravenous Once   amLODipine   10 mg Oral Daily   atorvastatin   10 mg Oral Daily   bisacodyl   10 mg Rectal Daily   budesonide -glycopyrrolate -formoterol   2 puff Inhalation BID   busPIRone   10 mg Oral TID   carvedilol   6.25 mg Oral BID WC   Chlorhexidine  Gluconate Cloth  6 each Topical Q0600   dextromethorphan -guaiFENesin   1 tablet Oral BID   famotidine   20 mg Oral QHS   ferrous sulfate   325 mg Oral Q breakfast   gabapentin   100 mg Oral TID   lactulose   30 g Oral TID   mupirocin  ointment  1 Application Nasal BID   nicotine   21 mg Transdermal Daily   pantoprazole   40 mg Oral BID AC   senna-docusate  2 tablet Oral BID   sodium bicarbonate   1,300 mg Oral BID   sodium chloride  flush  3 mL Intravenous Q12H   sucralfate   1 g Oral TID WC & HS   Continuous Infusions:  furosemide  160 mg (10/29/23 1513)   PRN Meds:.acetaminophen , albuterol , ALPRAZolam , nicotine  polacrilex, ondansetron  (ZOFRAN ) IV, traMADol , traZODone   Current Labs: reviewed    Physical Exam:  Blood pressure 138/69, pulse 69, temperature 97.9 F (36.6 C), resp. rate 13, height 5' 7 (1.702 m), weight 52.4 kg, SpO2 95%. GEN: chronically ill appearing  ENT: NCAT EYES: EOMI CV: regular, no rub PULM: nl WOB. CTAB, diminished in bases ABD: s/nt/n SKIN: no rashes/lesions EXT: No sig LEE VASC: RUE AVF  +b/t  A ESRD: some residual RRF. Uses RUE AVF.  HD cont on THS schedule at this time.  Nonadherent and comes to hospital for HD.  Not historically a candidate for outpt CLIP.  She tells me that she is unwilling to go  to a SNF.   AHRF / DYspnea: from #3 and hypervolemia: Cont UF as able; now on room air  Next HD 10/30/23 AoC Anemia, hx/o GIB for GI eval; ESA qThursday given 8/7.  TSAT > 80% no need for IV Fe, already got pRBC; s/p endoscopy with angioectasias.  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   Recent Labs  Lab 10/27/23 1046 10/28/23 0350  10/29/23 0302 10/29/23 1845 10/30/23 0324  WBC 5.9 8.0 6.6  --  5.2  NEUTROABS 3.4 5.1 4.0  --   --   HGB 8.5* 7.8* 6.8* 9.0* 9.7*  HCT 25.5* 23.9* 21.3* 28.0* 29.4*  MCV 90.4 91.6 91.8  --  88.3  PLT 306 314 313  --  366

## 2023-10-30 NOTE — Progress Notes (Signed)
 Patient having multiple loose BMs. Patient unable to make to the toilet.   Patient c/o pain still in her abdomen. Explained to patient that can't give her narcotics b/c that will make her more constipation.   Can reassess the BM regimen? Maybe slow it down possibly?   Thanks nursing

## 2023-10-30 NOTE — Procedures (Signed)
 Patient seen and examined on Hemodialysis. The procedure was supervised and I have made appropriate changes. BP 138/69   Pulse 69   Temp 97.9 F (36.6 C)   Resp 13   Ht 5' 7 (1.702 m)   Wt 52.4 kg   SpO2 95%   BMI 18.09 kg/m   QB 300 mL/ min via 16 g needles , UF goal 1L Tolerating treatment without complaints at this time.   [name] MD [facility] [phone] 10:53 AM

## 2023-10-30 NOTE — Progress Notes (Signed)
 PT Cancellation Note  Patient Details Name: [name] MRN: [id number] DOB: 02/11/1961   Cancelled Treatment:    Reason Eval/Treat Not Completed: (P) Patient at procedure or test/unavailable (pt off unit at HD) Will continue efforts per PT plan of care as schedule permits.   Connell CHRISTELLA [name] 10/30/2023, 12:39 PM

## 2023-10-31 ENCOUNTER — Inpatient Hospital Stay (HOSPITAL_COMMUNITY)

## 2023-10-31 LAB — CBC
HCT: 27.9 % — ABNORMAL LOW (ref 36.0–46.0)
Hemoglobin: 8.9 g/dL — ABNORMAL LOW (ref 12.0–15.0)
MCH: 29 pg (ref 26.0–34.0)
MCHC: 31.9 g/dL (ref 30.0–36.0)
MCV: 90.9 fL (ref 80.0–100.0)
Platelets: 355 K/uL (ref 150–400)
RBC: 3.07 MIL/uL — ABNORMAL LOW (ref 3.87–5.11)
RDW: 19.9 % — ABNORMAL HIGH (ref 11.5–15.5)
WBC: 6.2 K/uL (ref 4.0–10.5)
nRBC: 0.3 % — ABNORMAL HIGH (ref 0.0–0.2)

## 2023-10-31 LAB — BASIC METABOLIC PANEL WITH GFR
Anion gap: 13 (ref 5–15)
BUN: 28 mg/dL — ABNORMAL HIGH (ref 8–23)
CO2: 26 mmol/L (ref 22–32)
Calcium: 8.3 mg/dL — ABNORMAL LOW (ref 8.9–10.3)
Chloride: 97 mmol/L — ABNORMAL LOW (ref 98–111)
Creatinine, Ser: 3.44 mg/dL — ABNORMAL HIGH (ref 0.44–1.00)
GFR, Estimated: 14 mL/min — ABNORMAL LOW (ref >60)
Glucose, Bld: 97 mg/dL (ref 70–99)
Potassium: 3.7 mmol/L (ref 3.5–5.1)
Sodium: 136 mmol/L (ref 135–145)

## 2023-10-31 NOTE — Plan of Care (Signed)

## 2023-10-31 NOTE — TOC Progression Note (Signed)
 Transition of Care [facility]) - Progression Note    Patient Details  Name: [name] MRN: [id number] Date of Birth: Dec 30, 1960  Transition of Care [facility]) CM/SW Contact  Inocente GORMAN Kindle, LCSW Phone Number: 10/31/2023, 11:04 AM  Clinical Narrative:    CSW notes patient has been given community resources. CSW also made referral for housing assistance through [employer].    Expected Discharge Plan: Homeless Shelter Barriers to Discharge: Continued Medical Work up               Expected Discharge Plan and Services                                               Social Drivers of Health (SDOH) Interventions SDOH Screenings   Food Insecurity: Food Insecurity Present (10/23/2023)  Housing: High Risk (10/23/2023)  Transportation Needs: Unmet Transportation Needs (10/23/2023)  Utilities: Patient Unable To Answer (10/23/2023)  Recent Concern: Utilities - At Risk (07/25/2023)  Alcohol  Screen: Low Risk  (02/10/2023)  Depression (PHQ2-9): Medium Risk (06/11/2023)  Financial Resource Strain: High Risk (02/10/2023)  Physical Activity: Inactive (02/10/2023)  Social Connections: Socially Isolated (06/12/2023)  Tobacco Use: High Risk (10/26/2023)  Health Literacy: Adequate Health Literacy (02/10/2023)    Readmission Risk Interventions    07/01/2023   12:39 PM 06/16/2023    4:04 PM 01/28/2023   10:32 AM  Readmission Risk Prevention Plan  Transportation Screening Complete Complete Complete  Medication Review (RN Care Manager) Referral to Pharmacy Complete Complete  PCP or Specialist appointment within 3-5 days of discharge Complete Complete   HRI or Home Care Consult Complete Complete   SW Recovery Care/Counseling Consult Complete Complete   Palliative Care Screening Not Applicable Not Applicable   Skilled Nursing Facility Not Applicable Not Applicable

## 2023-10-31 NOTE — Discharge Instructions (Addendum)
   Affordable Housing Search [email or web address])   M-F 8a-3p [address] [place]  [place], [place] [address] [phone] Services include: laundry, barbering, support groups, case management, phone & computer access, showers, AA/NA mtgs, mental health/substance abuse nurse, job skills class, disability information, VA assistance, spiritual classes, etc. Winter Shelter available when temperatures are less than 32 degrees.   HOMELESS SHELTERS [facility] at [facility]- Call [phone] ext. [phone] or ext. [phone]. Located at [address], [place], [place] [address]  [facility] Mens [facility]- Call [phone]. Located at [address] [place] [address].  [facility] [employer]. Call [phone]. Office located at [address] [place] [address].  [facility] through [place] [phone].  [facility]- Call [phone]. Located at [address] [place], [place], [place] [address].  [facility]For Pregnant mothers. Call [phone]. Located at [address] [place], [address].  [facility] in [place]. Call [phone]. [facility] in [place]. Call [phone].  [facility] [employer] for [employer] [phone]. Office located at [address], [place], [address].  [employer] be agreeable to help with chores. Call [phone] ext. [phone].  Men's: [address] [place], [place] [address]. Women's: [facility]  [address] [place], [place] [address]  [facility] Mobile Crisis Management- [phone]  [facility] [address] [place], [place] [address]. Phone: [phone]    Follow with Primary MD  in 7 days   Get CBC, CMP, Magnesium , 2 view Chest X ray -  checked next visit with your primary  MD    Activity: As tolerated with Full fall precautions use walker/cane & assistance as needed  Disposition Home    Diet: Renal diet with strict 1200 cc fluid restriction per day  Special Instructions: If you have smoked or chewed Tobacco  in the last 2 yrs please stop smoking, stop any regular Alcohol   and or any Recreational drug use.  On your next visit with your primary care physician please Get Medicines reviewed and adjusted.  Please request your Prim.MD to go over all Hospital Tests and Procedure/Radiological results at the follow up, please get all Hospital records sent to your Prim MD by signing hospital release before you go home.  If you experience worsening of your admission symptoms, develop shortness of breath, life threatening emergency, suicidal or homicidal thoughts you must seek medical attention immediately by calling 911 or calling your MD immediately  if symptoms less severe.  You Must read complete instructions/literature along with all the possible adverse reactions/side effects for all the Medicines you take and that have been prescribed to you. Take any new Medicines after you have completely understood and accpet all the possible adverse reactions/side effects.   Do not drive when taking Pain medications.  Do not take more than prescribed Pain, Sleep and Anxiety Medications  Wear Seat belts while driving.

## 2023-10-31 NOTE — Progress Notes (Addendum)
 Physical Therapy Treatment Patient Details Name: BREYANNA VALERA MRN: [id number] DOB: 1960/04/03 Today's Date: 10/31/2023   History of Present Illness Pt is 63 yo presenting to [facility] on 8/6 due to respiratory distress. Pt was recently admitted 7/5-7/12 due to volume overload. Pt returned to ED 7/20 for HD and did not receive again until returning to ED on 8/6. PMHx: ESRD with noncompliance with HD, T2DM, COPD, PAD, HTN, GERD, GIB, AVM, CHF, anemia, polysubstance abuse, HLD, hep C, tobacco use, homelessness.    PT Comments  Patient is agreeable to PT session and requesting to walk. She declined using rolling walker in the room but would likely benefit from at least a cane due to unsteadiness with ambulation. She has intermittent veering while walking that seems more pronounced with distraction. Sp02 97% after walking on room air. Patient reports feeling significantly better overall. Recommend to continue PT to maximize independence and facilitate return to prior level of function.    If plan is discharge home, recommend the following: Assist for transportation   Can travel by private Merchant navy officer    Recommendations for Other Services       Precautions / Restrictions Precautions Precautions: Fall Recall of Precautions/Restrictions: Impaired Restrictions Weight Bearing Restrictions Per Provider Order: No     Mobility  Bed Mobility Overal bed mobility: Modified Independent                  Transfers Overall transfer level: Needs assistance Equipment used: None Transfers: Sit to/from Stand Sit to Stand: Contact guard assist           General transfer comment: CGA for safety    Ambulation/Gait Ambulation/Gait assistance: Contact guard assist, Min assist Gait Distance (Feet): 160 Feet Assistive device: None Gait Pattern/deviations: Step-through pattern, Drifts right/left, Narrow base of support Gait velocity: decreased     General  Gait Details: patient declined using rolling walker. without a device, she has intermittent veering that requires up to Min A for steadying and cues for safety. balance is worse with distraction with cues required for attention to task. Sp02 96% on room air after walking   Stairs             Wheelchair Mobility     Tilt Bed    Modified Rankin (Stroke Patients Only)       Balance Overall balance assessment: Needs assistance Sitting-balance support: Feet supported Sitting balance-Leahy Scale: Good     Standing balance support: No upper extremity supported Standing balance-Leahy Scale: Poor Standing balance comment: balance is worse with distraction and dynamic activity                            Communication Communication Communication: No apparent difficulties  Cognition Arousal: Alert Behavior During Therapy: Restless, Anxious   PT - Cognitive impairments: No family/caregiver present to determine baseline, Safety/Judgement                       PT - Cognition Comments: easily distracted, needs cues for attention to task. she has decreased awareness of need for physical assistance Following commands: Impaired Following commands impaired: Follows one step commands with increased time    Cueing Cueing Techniques: Verbal cues  Exercises      General Comments General comments (skin integrity, edema, etc.): recommend to try cane for ambulation which patient reports she has at home already (unsure  if this is accurate)      Pertinent Vitals/Pain Pain Assessment Pain Assessment: No/denies pain    Home Living                          Prior Function            PT Goals (current goals can now be found in the care plan section) Acute Rehab PT Goals Patient Stated Goal: to walk PT Goal Formulation: With patient Time For Goal Achievement: 11/08/23 Potential to Achieve Goals: Good Progress towards PT goals: Progressing toward  goals    Frequency    Min 1X/week      PT Plan      Co-evaluation              AM-PAC PT 6 Clicks Mobility   Outcome Measure  Help needed turning from your back to your side while in a flat bed without using bedrails?: None Help needed moving from lying on your back to sitting on the side of a flat bed without using bedrails?: A Little Help needed moving to and from a bed to a chair (including a wheelchair)?: A Little Help needed standing up from a chair using your arms (e.g., wheelchair or bedside chair)?: A Little Help needed to walk in hospital room?: A Little Help needed climbing 3-5 steps with a railing? : A Lot 6 Click Score: 18    End of Session   Activity Tolerance: Patient tolerated treatment well Patient left: in bed;with call bell/phone within reach;with bed alarm set   PT Visit Diagnosis: Unsteadiness on feet (R26.81);Other abnormalities of gait and mobility (R26.89)     Time: 8879-8863 PT Time Calculation (min) (ACUTE ONLY): 16 min  Charges:    $Therapeutic Activity: 8-22 mins PT General Charges $$ ACUTE PT VISIT: 1 Visit                     Randine Essex, PT, MPT    Randine LULLA Essex 10/31/2023, 12:20 PM

## 2023-10-31 NOTE — Progress Notes (Signed)
 PROGRESS NOTE    [name]  FMW:[id number] DOB: 1960-11-03 DOA: 10/22/2023 PCP: Pcp, No   63/F with hx of ESRD without OP HD clinic due to homelessness; was using TTS schedule via ED when adherent, hx of chronic anemia and GI bleeding from AVM, PUD, COPD, HTN, HLD, polysubstance use (cocaine mainly, prior amphetamine), current smoking, homelessness, who was recently admitted 7/5-12 and left AMA at that time, subsequently came to the ED on 7/20 and was dialyzed, left after HD.back in the ER 8/6 with respiratory distress, no HD in over 2wks, improved with BiPAP, dialysis - Hospital course complicated by worsening anemia, seen by GI, 4 duodenal AVMs treated with APC, transfuse PRBC - Now complicated by severe constipation and ileus  Subjective:  Patient in bed, appears comfortable, denies any headache, no fever, no chest pain or pressure, no shortness of breath , no abdominal pain. No new focal weakness.   Assessment and Plan:  Acute hypoxic respiratory failure secondary to volume overload/acute on chronic congestive heart failure - Volume overload/ESRD -Improved with BiPAP, HD    Acute on chronic anemia/ Duodenal AVMs -known history of AVM, hemoglobin 2 weeks ago was 9.4, she presented with a 5.1.   Patient received a total of 4 units of prbc transfusion, hemoglbin stable around 8.  She underwent enteroscopy showing - LA Grade A reflux esophagitis with no bleeding,  Erythematous mucosa in the stomach,  Four angioectasias in the duodenum, one with evidence of recent bleeding. Treated with argon plasma coagulation ( APC). Monitor hemoglobin.  Continue with PPI BID and pepcid  and sucralfate  Monitor CBC closely.  Severe constipation, ileus - KUB with severe stool burden, abdominal distention noted on exam, downgrade diet to clears, add enema and lactulose  3 times daily, having BMs.   ACS ruled out/demand ischemia ruled in:  Troponins were checked which were in 20s and flat indicative  of demand ischemia.   No chest pain.  Elevated D-dimer but CTA negative for PE.   Hyponatremia, mild and chronic, likely from hypervolemia.  Management per nephrology, HD patient.   Qtc prolongation - 511 -- Avoid QT prolonging meds    Social determinants: Homelessness, TOC following, does not seem to have any tangible solutions   COPD/chronic cough:  no wheezing heard.  Nebs prn, continue home Breztri .  Robitussin as needed.   Hypertension:  Well controlled. Continue with norvasc  10 mg daily, coreg  6.25 mg BID, hydralazine  prn.    HLD: Continue home Atorvastatin     Polysubstance / cocaine use: Check UDS   Smoking: on nicotine  patch.    Mood disorder:  Continue home Buspar .    Chronic pain: Continue home Gabapentin ,  DVT prophylaxis: SCDs Code Status: Full code Family Communication:none Disposition Plan: No Dispo options    Objective: Vitals:   10/30/23 1700 10/30/23 2101 10/31/23 0000 10/31/23 0420  BP:  132/68 130/65   Pulse: 88 99 95 91  Resp: 17 20 18 16   Temp:  98.7 F (37.1 C) 98.5 F (36.9 C)   TempSrc:  Oral Oral   SpO2: (!) 88% 94% 95% 100%  Weight:      Height:        Intake/Output Summary (Last 24 hours) at 10/31/2023 0831 Last data filed at 10/30/2023 1159 Gross per 24 hour  Intake --  Output 1700 ml  Net -1700 ml   Filed Weights   10/29/23 0503 10/30/23 0301 10/30/23 0829  Weight: 51.6 kg 52.4 kg 52.4 kg    Examination:  Awake  Alert, No new F.N deficits, Normal affect [place].AT,PERRAL Supple Neck, No JVD,   Symmetrical Chest wall movement, Good air movement bilaterally, CTAB RRR,No Gallops, Rubs or new Murmurs,  +ve B.Sounds, Abd Soft, No tenderness,   No Cyanosis, Clubbing or edema    Data Reviewed:    Data Review:   Inpatient Medications  Scheduled Meds:  sodium chloride    Intravenous Once   amLODipine   10 mg Oral Daily   atorvastatin   10 mg Oral Daily   bisacodyl   10 mg Rectal Daily   budesonide -glycopyrrolate -formoterol   2  puff Inhalation BID   busPIRone   10 mg Oral TID   carvedilol   6.25 mg Oral BID WC   Chlorhexidine  Gluconate Cloth  6 each Topical Q0600   darbepoetin (ARANESP ) injection - DIALYSIS  60 mcg Subcutaneous Q Thu-1800   dextromethorphan -guaiFENesin   1 tablet Oral BID   famotidine   20 mg Oral QHS   ferrous sulfate   325 mg Oral Q breakfast   gabapentin   100 mg Oral TID   lactulose   30 g Oral TID   mupirocin  ointment  1 Application Nasal BID   nicotine   21 mg Transdermal Daily   pantoprazole   40 mg Oral BID AC   senna-docusate  2 tablet Oral BID   sodium bicarbonate   1,300 mg Oral BID   sodium chloride  flush  3 mL Intravenous Q12H   sucralfate   1 g Oral TID WC & HS   Continuous Infusions:  furosemide  Stopped (10/30/23 1535)   PRN Meds:.acetaminophen , albuterol , ALPRAZolam , nicotine  polacrilex, ondansetron  (ZOFRAN ) IV, traMADol , traZODone   DVT Prophylaxis  SCDs Start: 10/23/23 0648   Recent Labs  Lab 10/25/23 0830 10/25/23 0906 10/26/23 0347 10/27/23 1046 10/28/23 0350 10/29/23 0302 10/29/23 1845 10/30/23 0324 10/31/23 0544  WBC 6.1  --  7.1 5.9 8.0 6.6  --  5.2 6.2  HGB 6.8*   < > 8.7* 8.5* 7.8* 6.8* 9.0* 9.7* 8.9*  HCT 20.5*   < > 25.6* 25.5* 23.9* 21.3* 28.0* 29.4* 27.9*  PLT 304  --  281 306 314 313  --  366 355  MCV 91.5  --  87.7 90.4 91.6 91.8  --  88.3 90.9  MCH 30.4  --  29.8 30.1 29.9 29.3  --  29.1 29.0  MCHC 33.2  --  34.0 33.3 32.6 31.9  --  33.0 31.9  RDW 18.6*  --  19.3* 19.4* 19.4* 19.5*  --  19.9* 19.9*  LYMPHSABS 1.1  --  1.2 1.1 1.2 1.3  --   --   --   MONOABS 0.7  --  0.9 0.9 1.2* 1.0  --   --   --   EOSABS 0.3  --  0.4 0.4 0.4 0.3  --   --   --   BASOSABS 0.1  --  0.1 0.1 0.0 0.0  --   --   --    < > = values in this interval not displayed.    Recent Labs  Lab 10/25/23 0830 10/26/23 0347 10/27/23 1046 10/28/23 0924 10/29/23 0302 10/30/23 0324 10/31/23 0544  NA 133*   < > 132* 127* 132* 130* 136  K 3.0*   < > 4.2 4.0 3.8 4.1 3.7  CL 107   <  > 99 93* 95* 94* 97*  CO2 15*   < > 19* 18* 22 20* 26  ANIONGAP 11   < > 14 16* 15 16* 13  GLUCOSE 91   < > 107* 119* 113* 82 97  BUN 61*   < > 64* 72* 53* 57* 28*  CREATININE 3.29*   < > 3.97* 4.25* 3.49* 3.80* 3.44*  ALBUMIN  2.2*  --   --  2.4*  --   --   --   PHOS 3.6  --   --  3.5  --   --   --   CALCIUM  6.5*   < > 8.4* 8.0* 8.2* 8.5* 8.3*   < > = values in this interval not displayed.      Recent Labs  Lab 10/27/23 1046 10/28/23 0924 10/29/23 0302 10/30/23 0324 10/31/23 0544  CALCIUM  8.4* 8.0* 8.2* 8.5* 8.3*    --------------------------------------------------------------------------------------------------------------- Lab Results  Component Value Date   CHOL 135 10/25/2022   HDL 43 10/25/2022   LDLCALC 70 10/25/2022   TRIG 111 10/25/2022   CHOLHDL 3.1 10/25/2022    Lab Results  Component Value Date   HGBA1C 5.0 05/16/2023   No results for input(s): TSH, T4TOTAL, FREET4, T3FREE, THYROIDAB in the last 72 hours. Recent Labs    10/30/23 0324  VITAMINB12 376  FOLATE 13.7  FERRITIN 475*  TIBC 274  IRON  40  RETICCTPCT 3.6*   ------------------------------------------------------------------------------------------------------------------ Cardiac Enzymes No results for input(s): CKMB, TROPONINI, MYOGLOBIN in the last 168 hours.  Invalid input(s): CK  Micro Results Recent Results (from the past 240 hours)  Resp panel by RT-PCR (RSV, Flu A&B, Covid) Anterior Nasal Swab     Status: None   Collection Time: 10/22/23 11:51 PM   Specimen: Anterior Nasal Swab  Result Value Ref Range Status   SARS Coronavirus 2 by RT PCR NEGATIVE NEGATIVE Final   Influenza A by PCR NEGATIVE NEGATIVE Final   Influenza B by PCR NEGATIVE NEGATIVE Final    Comment: (NOTE) The Xpert Xpress SARS-CoV-2/FLU/RSV plus assay is intended as an aid in the diagnosis of influenza from Nasopharyngeal swab specimens and should not be used as a sole basis for treatment. Nasal  washings and aspirates are unacceptable for Xpert Xpress SARS-CoV-2/FLU/RSV testing.  Fact Sheet for Patients: BloggerCourse.com  Fact Sheet for Healthcare Providers: SeriousBroker.it  This test is not yet approved or cleared by the [place]  FDA and has been authorized for detection and/or diagnosis of SARS-CoV-2 by FDA under an Emergency Use Authorization (EUA). This EUA will remain in effect (meaning this test can be used) for the duration of the COVID-19 declaration under Section 564(b)(1) of the Act, 21 U.S.C. section 360bbb-3(b)(1), unless the authorization is terminated or revoked.     Resp Syncytial Virus by PCR NEGATIVE NEGATIVE Final    Comment: (NOTE) Fact Sheet for Patients: BloggerCourse.com  Fact Sheet for Healthcare Providers: SeriousBroker.it  This test is not yet approved or cleared by the [place]  FDA and has been authorized for detection and/or diagnosis of SARS-CoV-2 by FDA under an Emergency Use Authorization (EUA). This EUA will remain in effect (meaning this test can be used) for the duration of the COVID-19 declaration under Section 564(b)(1) of the Act, 21 U.S.C. section 360bbb-3(b)(1), unless the authorization is terminated or revoked.  Performed at [facility], [address] [place], [place] [address]   MRSA Next Gen by PCR, Nasal     Status: Abnormal   Collection Time: 10/23/23  4:23 PM   Specimen: Nasal Mucosa; Nasal Swab  Result Value Ref Range Status   MRSA by PCR Next Gen DETECTED (A) NOT DETECTED Final    Comment: RESULT CALLED TO, READ BACK BY AND VERIFIED WITH: RN KRISTIE H.  [id number] AT 1826, ADC (NOTE) The GeneXpert MRSA Assay (FDA approved for NASAL specimens only), is one component of a comprehensive MRSA colonization surveillance program. It is not intended to diagnose MRSA infection nor to guide or monitor treatment  for MRSA infections. Test performance is not FDA approved in patients less than 57 years old. Performed at [facility], [address], [place], [place] [address]     Radiology Reports  DG Abd Portable 1V Result Date: 10/31/2023 EXAM: 1 VIEW XRAY OF THE ABDOMEN 10/31/2023 06:26:03 AM COMPARISON: 10/28/2023 CLINICAL HISTORY: Constipated. FINDINGS: BOWEL: Interval resolution of stool burden from the right colon and transverse colon. Mild-to-moderate residual stool from the splenic flexure to the rectum. Similar air filled loops of small bowel within the left lower quadrant of the abdomen. No signs of free air. SOFT TISSUES: No abnormal calcifications. No opaque urinary calculi. BONES: No acute osseous abnormality. LUNGS: Small bilateral pleural effusions noted within the imaged portions of the lower lungs. IMPRESSION: 1. Interval resolution of stool burden from the right colon and transverse colon. 2. Mild-to-moderate residual stool from the splenic flexure to the rectum. 3. Small bilateral pleural effusions. Electronically signed by: Waddell Calk MD 10/31/2023 07:07 AM EDT RP Workstation: [id number]      Signature  -   Lavada Stank M.D on 10/31/2023 at 8:31 AM   -  To page go to [email or web address]

## 2023-10-31 NOTE — Progress Notes (Signed)
 Admit: 10/22/2023 LOS: 8  32F ESRD here with progressive dysnpea, hypoxia, and AoC anemia.   Subjective:  Feeling well, walked with PT.  Hoping to leave the hospital tomorrow.    08/14 0701 - 08/15 0700 In: -  Out: 1700   Filed Weights   10/29/23 0503 10/30/23 0301 10/30/23 0829  Weight: 51.6 kg 52.4 kg 52.4 kg    Scheduled Meds:  sodium chloride    Intravenous Once   amLODipine   10 mg Oral Daily   atorvastatin   10 mg Oral Daily   bisacodyl   10 mg Rectal Daily   budesonide -glycopyrrolate -formoterol   2 puff Inhalation BID   busPIRone   10 mg Oral TID   carvedilol   6.25 mg Oral BID WC   Chlorhexidine  Gluconate Cloth  6 each Topical Q0600   darbepoetin (ARANESP ) injection - DIALYSIS  60 mcg Subcutaneous Q Thu-1800   dextromethorphan -guaiFENesin   1 tablet Oral BID   famotidine   20 mg Oral QHS   ferrous sulfate   325 mg Oral Q breakfast   gabapentin   100 mg Oral TID   lactulose   30 g Oral TID   mupirocin  ointment  1 Application Nasal BID   nicotine   21 mg Transdermal Daily   pantoprazole   40 mg Oral BID AC   senna-docusate  2 tablet Oral BID   sodium bicarbonate   1,300 mg Oral BID   sodium chloride  flush  3 mL Intravenous Q12H   sucralfate   1 g Oral TID WC & HS   Continuous Infusions:  furosemide  160 mg (10/31/23 0904)   PRN Meds:.acetaminophen , albuterol , ALPRAZolam , nicotine  polacrilex, ondansetron  (ZOFRAN ) IV, traMADol , traZODone   Current Labs: reviewed    Physical Exam:  Blood pressure (!) 126/53, pulse 79, temperature 98.9 F (37.2 C), temperature source Oral, resp. rate 18, height 5' 7 (1.702 m), weight 52.4 kg, SpO2 92%. GEN: chronically ill appearing  ENT: NCAT EYES: EOMI CV: regular, no rub PULM: nl WOB. CTAB, diminished in bases ABD: s/nt/n SKIN: no rashes/lesions EXT: No sig LEE VASC: RUE AVF  +b/t  A ESRD: some residual RRF. Uses RUE AVF.  HD cont on THS schedule at this time.  Nonadherent and comes to hospital for HD.  Not historically a candidate for  outpt CLIP.  She tells me that she is unwilling to go to a SNF.   AHRF / DYspnea: from #3 and hypervolemia: Cont UF as able; now on room air  Next HD 11/01/23 AoC Anemia, hx/o GIB for GI eval; ESA qThursday given 8/7.  TSAT > 80% no need for IV Fe, already got pRBC; s/p endoscopy with angioectasias.  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, CTM Hyponatremia 2/2 hypervolemia in low GFR state, restrict free water  Homelessness, social work/TOC assisting as able     [name] MD 10/31/2023, 12:03 PM  Recent Labs  Lab 10/25/23 0830 10/26/23 0347 10/28/23 0924 10/29/23 0302 10/30/23 0324 10/31/23 0544  NA 133*   < > 127* 132* 130* 136  K 3.0*   < > 4.0 3.8 4.1 3.7  CL 107   < > 93* 95* 94* 97*  CO2 15*   < > 18* 22 20* 26  GLUCOSE 91   < > 119* 113* 82 97  BUN 61*   < > 72* 53* 57* 28*  CREATININE 3.29*   < > 4.25* 3.49* 3.80* 3.44*  CALCIUM  6.5*   < > 8.0* 8.2* 8.5* 8.3*  PHOS 3.6  --  3.5  --   --   --    < > =  values in this interval not displayed.   Recent Labs  Lab 10/27/23 1046 10/28/23 0350 10/29/23 0302 10/29/23 1845 10/30/23 0324 10/31/23 0544  WBC 5.9 8.0 6.6  --  5.2 6.2  NEUTROABS 3.4 5.1 4.0  --   --   --   HGB 8.5* 7.8* 6.8* 9.0* 9.7* 8.9*  HCT 25.5* 23.9* 21.3* 28.0* 29.4* 27.9*  MCV 90.4 91.6 91.8  --  88.3 90.9  PLT 306 314 313  --  366 355

## 2023-11-01 NOTE — Progress Notes (Signed)
 PROGRESS NOTE    [name]  FMW:[id number] DOB: 13-Jun-1960 DOA: 10/22/2023 PCP: Pcp, No   63/F with hx of ESRD without OP HD clinic due to homelessness; was using TTS schedule via ED when adherent, hx of chronic anemia and GI bleeding from AVM, PUD, COPD, HTN, HLD, polysubstance use (cocaine mainly, prior amphetamine), current smoking, homelessness, who was recently admitted 7/5-12 and left AMA at that time, subsequently came to the ED on 7/20 and was dialyzed, left after HD.back in the ER 8/6 with respiratory distress, no HD in over 2wks, improved with BiPAP, dialysis - Hospital course complicated by worsening anemia, seen by GI, 4 duodenal AVMs treated with APC, transfuse PRBC - Now complicated by severe constipation and ileus  Subjective:  Patient in bed, appears comfortable, denies any headache, no fever, no chest pain or pressure, no shortness of breath , no abdominal pain. No new focal weakness.    Assessment and Plan:  Acute hypoxic respiratory failure secondary to volume overload/acute on chronic congestive heart failure - Volume overload/ESRD -Improved with BiPAP, HD    Acute on chronic anemia/ Duodenal AVMs -known history of AVM, hemoglobin 2 weeks ago was 9.4, she presented with a 5.1.   Patient received a total of 4 units of prbc transfusion, hemoglbin stable around 8.  She underwent enteroscopy showing - LA Grade A reflux esophagitis with no bleeding,  Erythematous mucosa in the stomach,  Four angioectasias in the duodenum, one with evidence of recent bleeding. Treated with argon plasma coagulation ( APC). Monitor hemoglobin.  Continue with PPI BID and pepcid  and sucralfate  Monitor CBC closely.  Severe constipation, ileus - KUB with severe stool burden, abdominal distention noted on exam, downgrade diet to clears, add enema and lactulose  3 times daily, having BMs.   ACS ruled out/demand ischemia ruled in:  Troponins were checked which were in 20s and flat indicative  of demand ischemia.   No chest pain.  Elevated D-dimer but CTA negative for PE.   Hyponatremia, mild and chronic, likely from hypervolemia.  Management per nephrology, HD patient.   Qtc prolongation - 511 -- Avoid QT prolonging meds    Social determinants: Homelessness, TOC following, does not seem to have any tangible solutions   COPD/chronic cough:  no wheezing heard.  Nebs prn, continue home Breztri .  Robitussin as needed.   Hypertension:  Well controlled. Continue with norvasc  10 mg daily, coreg  6.25 mg BID, hydralazine  prn.    HLD: Continue home Atorvastatin     Polysubstance / cocaine use: Check UDS   Smoking: on nicotine  patch.    Mood disorder:  Continue home Buspar .    Chronic pain: Continue home Gabapentin ,  DVT prophylaxis: SCDs Code Status: Full code Family Communication:none Disposition Plan: No Dispo options    Objective: Vitals:   10/31/23 2110 11/01/23 0006 11/01/23 0432 11/01/23 0846  BP:  (!) 128/56 133/66 (!) 156/79  Pulse: 92  76 84  Resp: (!) 24 20 18 20   Temp:  98.4 F (36.9 C) 98.5 F (36.9 C) 98.2 F (36.8 C)  TempSrc:  Oral Oral Oral  SpO2: 94%  92% 94%  Weight:      Height:        Intake/Output Summary (Last 24 hours) at 11/01/2023 0950 Last data filed at 10/31/2023 2154 Gross per 24 hour  Intake 789 ml  Output --  Net 789 ml   Filed Weights   10/29/23 0503 10/30/23 0301 10/30/23 0829  Weight: 51.6 kg 52.4 kg 52.4 kg  Examination:  Awake Alert, No new F.N deficits, Normal affect [place].AT,PERRAL Supple Neck, No JVD,   Symmetrical Chest wall movement, Good air movement bilaterally, CTAB RRR,No Gallops, Rubs or new Murmurs,  +ve B.Sounds, Abd Soft, No tenderness,   No Cyanosis, Clubbing or edema    Data Reviewed:    Data Review:   Inpatient Medications  Scheduled Meds:  amLODipine   10 mg Oral Daily   atorvastatin   10 mg Oral Daily   bisacodyl   10 mg Rectal Daily   budesonide -glycopyrrolate -formoterol   2 puff  Inhalation BID   busPIRone   10 mg Oral TID   carvedilol   6.25 mg Oral BID WC   Chlorhexidine  Gluconate Cloth  6 each Topical Q0600   darbepoetin (ARANESP ) injection - DIALYSIS  60 mcg Subcutaneous Q Thu-1800   dextromethorphan -guaiFENesin   1 tablet Oral BID   famotidine   20 mg Oral QHS   ferrous sulfate   325 mg Oral Q breakfast   gabapentin   100 mg Oral TID   lactulose   30 g Oral TID   nicotine   21 mg Transdermal Daily   pantoprazole   40 mg Oral BID AC   senna-docusate  2 tablet Oral BID   sodium bicarbonate   1,300 mg Oral BID   sodium chloride  flush  3 mL Intravenous Q12H   sucralfate   1 g Oral TID WC & HS   Continuous Infusions:  furosemide  160 mg (11/01/23 0859)   PRN Meds:.acetaminophen , albuterol , ALPRAZolam , nicotine  polacrilex, ondansetron  (ZOFRAN ) IV, traMADol , traZODone   DVT Prophylaxis  SCDs Start: 10/23/23 0648   Recent Labs  Lab 10/26/23 0347 10/27/23 1046 10/28/23 0350 10/29/23 0302 10/29/23 1845 10/30/23 0324 10/31/23 0544  WBC 7.1 5.9 8.0 6.6  --  5.2 6.2  HGB 8.7* 8.5* 7.8* 6.8* 9.0* 9.7* 8.9*  HCT 25.6* 25.5* 23.9* 21.3* 28.0* 29.4* 27.9*  PLT 281 306 314 313  --  366 355  MCV 87.7 90.4 91.6 91.8  --  88.3 90.9  MCH 29.8 30.1 29.9 29.3  --  29.1 29.0  MCHC 34.0 33.3 32.6 31.9  --  33.0 31.9  RDW 19.3* 19.4* 19.4* 19.5*  --  19.9* 19.9*  LYMPHSABS 1.2 1.1 1.2 1.3  --   --   --   MONOABS 0.9 0.9 1.2* 1.0  --   --   --   EOSABS 0.4 0.4 0.4 0.3  --   --   --   BASOSABS 0.1 0.1 0.0 0.0  --   --   --     Recent Labs  Lab 10/27/23 1046 10/28/23 0924 10/29/23 0302 10/30/23 0324 10/31/23 0544  NA 132* 127* 132* 130* 136  K 4.2 4.0 3.8 4.1 3.7  CL 99 93* 95* 94* 97*  CO2 19* 18* 22 20* 26  ANIONGAP 14 16* 15 16* 13  GLUCOSE 107* 119* 113* 82 97  BUN 64* 72* 53* 57* 28*  CREATININE 3.97* 4.25* 3.49* 3.80* 3.44*  ALBUMIN   --  2.4*  --   --   --   PHOS  --  3.5  --   --   --   CALCIUM  8.4* 8.0* 8.2* 8.5* 8.3*      Recent Labs  Lab  10/27/23 1046 10/28/23 0924 10/29/23 0302 10/30/23 0324 10/31/23 0544  CALCIUM  8.4* 8.0* 8.2* 8.5* 8.3*    --------------------------------------------------------------------------------------------------------------- Lab Results  Component Value Date   CHOL 135 10/25/2022   HDL 43 10/25/2022   LDLCALC 70 10/25/2022   TRIG 111 10/25/2022   CHOLHDL 3.1 10/25/2022  Lab Results  Component Value Date   HGBA1C 5.0 05/16/2023   No results for input(s): TSH, T4TOTAL, FREET4, T3FREE, THYROIDAB in the last 72 hours. Recent Labs    10/30/23 0324  VITAMINB12 376  FOLATE 13.7  FERRITIN 475*  TIBC 274  IRON  40  RETICCTPCT 3.6*   ------------------------------------------------------------------------------------------------------------------ Cardiac Enzymes No results for input(s): CKMB, TROPONINI, MYOGLOBIN in the last 168 hours.  Invalid input(s): CK  Micro Results Recent Results (from the past 240 hours)  Resp panel by RT-PCR (RSV, Flu A&B, Covid) Anterior Nasal Swab     Status: None   Collection Time: 10/22/23 11:51 PM   Specimen: Anterior Nasal Swab  Result Value Ref Range Status   SARS Coronavirus 2 by RT PCR NEGATIVE NEGATIVE Final   Influenza A by PCR NEGATIVE NEGATIVE Final   Influenza B by PCR NEGATIVE NEGATIVE Final    Comment: (NOTE) The Xpert Xpress SARS-CoV-2/FLU/RSV plus assay is intended as an aid in the diagnosis of influenza from Nasopharyngeal swab specimens and should not be used as a sole basis for treatment. Nasal washings and aspirates are unacceptable for Xpert Xpress SARS-CoV-2/FLU/RSV testing.  Fact Sheet for Patients: BloggerCourse.com  Fact Sheet for Healthcare Providers: SeriousBroker.it  This test is not yet approved or cleared by the [place]  FDA and has been authorized for detection and/or diagnosis of SARS-CoV-2 by FDA under an Emergency Use Authorization  (EUA). This EUA will remain in effect (meaning this test can be used) for the duration of the COVID-19 declaration under Section 564(b)(1) of the Act, 21 U.S.C. section 360bbb-3(b)(1), unless the authorization is terminated or revoked.     Resp Syncytial Virus by PCR NEGATIVE NEGATIVE Final    Comment: (NOTE) Fact Sheet for Patients: BloggerCourse.com  Fact Sheet for Healthcare Providers: SeriousBroker.it  This test is not yet approved or cleared by the [place]  FDA and has been authorized for detection and/or diagnosis of SARS-CoV-2 by FDA under an Emergency Use Authorization (EUA). This EUA will remain in effect (meaning this test can be used) for the duration of the COVID-19 declaration under Section 564(b)(1) of the Act, 21 U.S.C. section 360bbb-3(b)(1), unless the authorization is terminated or revoked.  Performed at [facility], [address], [place], [place] [address]   MRSA Next Gen by PCR, Nasal     Status: Abnormal   Collection Time: 10/23/23  4:23 PM   Specimen: Nasal Mucosa; Nasal Swab  Result Value Ref Range Status   MRSA by PCR Next Gen DETECTED (A) NOT DETECTED Final    Comment: RESULT CALLED TO, READ BACK BY AND VERIFIED WITH: RN ROXIE HUNT [phone] AT 1826, ADC (NOTE) The GeneXpert MRSA Assay (FDA approved for NASAL specimens only), is one component of a comprehensive MRSA colonization surveillance program. It is not intended to diagnose MRSA infection nor to guide or monitor treatment for MRSA infections. Test performance is not FDA approved in patients less than 77 years old. Performed at [facility], [address] [place], [place] [address]     Radiology Reports  DG Abd Portable 1V Result Date: 10/31/2023 EXAM: 1 VIEW XRAY OF THE ABDOMEN 10/31/2023 06:26:03 AM COMPARISON: 10/28/2023 CLINICAL HISTORY: Constipated. FINDINGS: BOWEL: Interval resolution of stool burden from the right  colon and transverse colon. Mild-to-moderate residual stool from the splenic flexure to the rectum. Similar air filled loops of small bowel within the left lower quadrant of the abdomen. No signs of free air. SOFT TISSUES: No abnormal calcifications. No  opaque urinary calculi. BONES: No acute osseous abnormality. LUNGS: Small bilateral pleural effusions noted within the imaged portions of the lower lungs. IMPRESSION: 1. Interval resolution of stool burden from the right colon and transverse colon. 2. Mild-to-moderate residual stool from the splenic flexure to the rectum. 3. Small bilateral pleural effusions. Electronically signed by: Waddell Calk MD 10/31/2023 07:07 AM EDT RP Workstation: [id number]      Signature  -   Lavada Stank M.D on 11/01/2023 at 9:50 AM   -  To page go to [email or web address]

## 2023-11-01 NOTE — Plan of Care (Signed)

## 2023-11-01 NOTE — Progress Notes (Signed)
   11/01/23 1530  Spiritual Encounters  Type of Visit Initial  Care provided to: Patient  Referral source Patient request  Reason for visit Routine spiritual support  OnCall Visit Yes  Interventions  Spiritual Care Interventions Made Established relationship of care and support;Compassionate presence;Reflective listening;Normalization of emotions;Prayer;Encouragement  Intervention Outcomes  Outcomes Awareness of support;Awareness of health;Connection to spiritual care;Reduced anxiety;Reduced isolation   Chaplain responded to spiritual consult.  Patient was welcoming and shared the concerns on her heart.  Chaplain prayed with patient as requested.  Patient asks for a Bible; Chaplain will follow up.   Chaplain spiritual support services remain available as the need arises.

## 2023-11-01 NOTE — Progress Notes (Signed)
 Lasix  gtt is turned off not sure for how [place].  Requested a new bag from pharmacy.  Will restart soon as I receive it.

## 2023-11-01 NOTE — Progress Notes (Signed)
 Admit: 10/22/2023 LOS: 9  89F ESRD here with progressive dysnpea, hypoxia, and AoC anemia.   Subjective:  Declined HD this am- wants to do 2nd shift.    08/15 0701 - 08/16 0700 In: 789 [P.O.:720; I.V.:3; IV Piggyback:66] Out: -   Filed Weights   10/29/23 0503 10/30/23 0301 10/30/23 0829  Weight: 51.6 kg 52.4 kg 52.4 kg    Scheduled Meds:  amLODipine   10 mg Oral Daily   atorvastatin   10 mg Oral Daily   bisacodyl   10 mg Rectal Daily   budesonide -glycopyrrolate -formoterol   2 puff Inhalation BID   busPIRone   10 mg Oral TID   carvedilol   6.25 mg Oral BID WC   Chlorhexidine  Gluconate Cloth  6 each Topical Q0600   darbepoetin (ARANESP ) injection - DIALYSIS  60 mcg Subcutaneous Q Thu-1800   dextromethorphan -guaiFENesin   1 tablet Oral BID   famotidine   20 mg Oral QHS   ferrous sulfate   325 mg Oral Q breakfast   gabapentin   100 mg Oral TID   lactulose   30 g Oral TID   nicotine   21 mg Transdermal Daily   pantoprazole   40 mg Oral BID AC   senna-docusate  2 tablet Oral BID   sodium bicarbonate   1,300 mg Oral BID   sodium chloride  flush  3 mL Intravenous Q12H   sucralfate   1 g Oral TID WC & HS   Continuous Infusions:  furosemide  160 mg (11/01/23 0859)   PRN Meds:.acetaminophen , albuterol , ALPRAZolam , nicotine  polacrilex, ondansetron  (ZOFRAN ) IV, traMADol , traZODone   Current Labs: reviewed    Physical Exam:  Blood pressure (!) 156/79, pulse 84, temperature 98.2 F (36.8 C), temperature source Oral, resp. rate 20, height 5' 7 (1.702 m), weight 52.4 kg, SpO2 94%. GEN: chronically ill appearing  ENT: NCAT EYES: EOMI CV: regular, no rub PULM: nl WOB. CTAB, diminished in bases ABD: s/nt/n SKIN: no rashes/lesions EXT: No sig LEE VASC: RUE AVF  +b/t  A ESRD: some residual RRF. Uses RUE AVF.  HD cont on THS schedule at this time.  Nonadherent and comes to hospital for HD.  Not historically a candidate for outpt CLIP.  She tells me that she is unwilling to go to a SNF.   AHRF /  DYspnea: from #3 and hypervolemia: Cont UF as able; now on room air  Next HD 11/01/23 AoC Anemia, hx/o GIB for GI eval; ESA qThursday given 8/7.  TSAT > 80% no need for IV Fe, already got pRBC; s/p endoscopy with angioectasias.  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, CTM Hyponatremia 2/2 hypervolemia in low GFR state, restrict free water  Homelessness, social work/TOC assisting as able     Cynthia Bonine MD 11/01/2023, 10:50 AM  Recent Labs  Lab 10/28/23 0924 10/29/23 0302 10/30/23 0324 10/31/23 0544  NA 127* 132* 130* 136  K 4.0 3.8 4.1 3.7  CL 93* 95* 94* 97*  CO2 18* 22 20* 26  GLUCOSE 119* 113* 82 97  BUN 72* 53* 57* 28*  CREATININE 4.25* 3.49* 3.80* 3.44*  CALCIUM  8.0* 8.2* 8.5* 8.3*  PHOS 3.5  --   --   --    Recent Labs  Lab 10/27/23 1046 10/28/23 0350 10/29/23 0302 10/29/23 1845 10/30/23 0324 10/31/23 0544  WBC 5.9 8.0 6.6  --  5.2 6.2  NEUTROABS 3.4 5.1 4.0  --   --   --   HGB 8.5* 7.8* 6.8* 9.0* 9.7* 8.9*  HCT 25.5* 23.9* 21.3* 28.0* 29.4* 27.9*  MCV 90.4 91.6 91.8  --  88.3 90.9  PLT 306 314 313  --  366 355

## 2023-11-01 NOTE — Plan of Care (Signed)
   Problem: Education: Goal: Knowledge of General Education information will improve Description: Including pain rating scale, medication(s)/side effects and non-pharmacologic comfort measures Outcome: Progressing   Problem: Health Behavior/Discharge Planning: Goal: Ability to manage health-related needs will improve Outcome: Progressing   Problem: Clinical Measurements: Goal: Ability to maintain clinical measurements within normal limits will improve Outcome: Progressing   Problem: Nutrition: Goal: Adequate nutrition will be maintained Outcome: Progressing   Problem: Elimination: Goal: Will not experience complications related to bowel motility Outcome: Progressing

## 2023-11-01 NOTE — TOC Progression Note (Addendum)
 Transition of Care [facility]) - Progression Note    Patient Details  Name: [name] MRN: [id number] Date of Birth: [birth year]  [facility] CM/SW Contact  Inocente GORMAN Kindle, LCSW Phone Number: 11/01/2023, 10:58 AM  Clinical Narrative:    CSW met with patient. She asked for someone to help her find housing. CSW explained that she would need to follow up with a housing agency in the community as the hospital is unable to follow her once she is discharged. CSW provided community resources. Patient stated she is on the waitlist with the Housing Authority but it is two years [place]. She asked to discharge to [facility]. CSW asked why she did not go there upon discharge last admission as she was set up to. She stated she never heard from them. CSW explained that it is her responsibility to contact them for follow up. Patient does have her new phone with her [phone]) and email [email or web address]. She stated she was working with a housing person at the [facility] who emailed her a [employer]graph[employer] surveyor. She asked CSW to folllow up with her. CSW asked if she was able to email her as she had been doing so since CSW will not be with the patient after hospitalization. Patient stated she does not know how to look at emails (though she was able to look at the list of places). She wants the [facility] person to pick her up and take her to look at apartments.  Community referrals have been sent and CSW spoke with Jamee at [facility] and added patient to their waitlist for a disability room. CSW told patient that she has to call [facility] to continue checking the status. CSW sent secure email to Aloysius Ford with congregational nursing at the [facility] to see if she can assist patient.     Patient requesting transportation at discharge. She stated she will call her ex-husband to get the address of her friend who lives on [address]. Sometimes her ex lets her stay with him but he lives in [place] which  is too far for her to come to the ED for dialysis.   Patient stated she could stay at the [facility] but would have to use her check.   Patient requesting a shirt as she does not want paper scrubs. Due to patient's income she does not qualify for items from the clothes closet but CSW provided a donated shirt.       Expected Discharge Plan: Homeless Shelter Barriers to Discharge: Continued Medical Work up               Expected Discharge Plan and Services                                               Social Drivers of Health (SDOH) Interventions SDOH Screenings   Food Insecurity: Food Insecurity Present (10/23/2023)  Housing: High Risk (10/23/2023)  Transportation Needs: Unmet Transportation Needs (10/23/2023)  Utilities: Patient Unable To Answer (10/23/2023)  Recent Concern: Utilities - At Risk (07/25/2023)  Alcohol  Screen: Low Risk  (02/10/2023)  Depression (PHQ2-9): Medium Risk (06/11/2023)  Financial Resource Strain: High Risk (02/10/2023)  Physical Activity: Inactive (02/10/2023)  Social Connections: Socially Isolated (06/12/2023)  Tobacco Use: High Risk (10/26/2023)  Health Literacy: Adequate Health Literacy (02/10/2023)    Readmission Risk Interventions    07/01/2023  12:39 PM 06/16/2023    4:04 PM 01/28/2023   10:32 AM  Readmission Risk Prevention Plan  Transportation Screening Complete Complete Complete  Medication Review (RN Care Manager) Referral to Pharmacy Complete Complete  PCP or Specialist appointment within 3-5 days of discharge Complete Complete   HRI or Home Care Consult Complete Complete   SW Recovery Care/Counseling Consult Complete Complete   Palliative Care Screening Not Applicable Not Applicable   Skilled Nursing Facility Not Applicable Not Applicable

## 2023-11-01 NOTE — Progress Notes (Signed)
 Mobility Specialist: Progress Note   11/01/23 1427  Therapy Vitals  BP (!) 139/93  Mobility  Activity Ambulated with assistance  Level of Assistance Standby assist, set-up cues, supervision of patient - no hands on  Assistive Device None  Distance Ambulated (ft) 500 ft  Activity Response Tolerated well  Mobility Referral Yes  Mobility visit 1 Mobility  Mobility Specialist Start Time (ACUTE ONLY) 1120  Mobility Specialist Stop Time (ACUTE ONLY) 1141  Mobility Specialist Time Calculation (min) (ACUTE ONLY) 21 min    Pt received in bed, agreeable to mobility session. Close SV throughout but no physical assist needed. SpO2 97% on RA. Returned to room without fault. Left in bed with all needs met, call bell in reach.   Ileana Lute Mobility Specialist Please contact via SecureChat or Rehab office at [phone]

## 2023-11-02 MED ORDER — SODIUM BICARBONATE 650 MG PO TABS: 1300.0000 mg | ORAL_TABLET | Freq: Two times a day (BID) | ORAL | 0 refills | Status: DC

## 2023-11-02 MED ORDER — BUSPIRONE HCL 10 MG PO TABS: 10.0000 mg | ORAL_TABLET | Freq: Three times a day (TID) | ORAL | 0 refills | Status: AC

## 2023-11-02 MED ORDER — ALBUTEROL SULFATE HFA 108 (90 BASE) MCG/ACT IN AERS: 2.0000 | INHALATION_SPRAY | Freq: Four times a day (QID) | RESPIRATORY_TRACT | 0 refills | Status: DC | PRN

## 2023-11-02 MED ORDER — ALPRAZOLAM 0.25 MG PO TABS: 0.2500 mg | ORAL_TABLET | Freq: Every evening | ORAL | 0 refills | Status: DC | PRN

## 2023-11-02 MED ORDER — ACETAMINOPHEN 325 MG PO TABS: 650.0000 mg | ORAL_TABLET | Freq: Four times a day (QID) | ORAL | 0 refills | Status: DC | PRN

## 2023-11-02 MED ORDER — SUCRALFATE 1 GM/10ML PO SUSP: 1.0000 g | Freq: Three times a day (TID) | ORAL | 0 refills | Status: DC

## 2023-11-02 MED ORDER — TRELEGY ELLIPTA 100-62.5-25 MCG/ACT IN AEPB: 1.0000 | INHALATION_SPRAY | Freq: Every day | RESPIRATORY_TRACT | 0 refills | Status: DC

## 2023-11-02 MED ORDER — FERROUS SULFATE 325 (65 FE) MG PO TABS: 325.0000 mg | ORAL_TABLET | Freq: Every day | ORAL | 0 refills | Status: DC

## 2023-11-02 MED ORDER — NICOTINE 21 MG/24HR TD PT24: 21.0000 mg | MEDICATED_PATCH | Freq: Every day | TRANSDERMAL | 0 refills | Status: DC

## 2023-11-02 MED ORDER — GABAPENTIN 100 MG PO CAPS: 100.0000 mg | ORAL_CAPSULE | Freq: Two times a day (BID) | ORAL | 0 refills | Status: DC

## 2023-11-02 MED ORDER — CARVEDILOL 6.25 MG PO TABS: 6.2500 mg | ORAL_TABLET | Freq: Two times a day (BID) | ORAL | 0 refills | Status: DC

## 2023-11-02 MED ORDER — PANTOPRAZOLE SODIUM 40 MG PO TBEC: 40.0000 mg | DELAYED_RELEASE_TABLET | Freq: Two times a day (BID) | ORAL | 0 refills | Status: DC

## 2023-11-02 MED ORDER — ATORVASTATIN CALCIUM 10 MG PO TABS: 10.0000 mg | ORAL_TABLET | Freq: Every day | ORAL | 0 refills | Status: DC

## 2023-11-02 MED ORDER — CALCITRIOL 0.25 MCG PO CAPS: 0.2500 ug | ORAL_CAPSULE | Freq: Every day | ORAL | 0 refills | Status: DC

## 2023-11-02 MED ORDER — AMLODIPINE BESYLATE 10 MG PO TABS: 10.0000 mg | ORAL_TABLET | Freq: Every day | ORAL | 0 refills | Status: DC

## 2023-11-02 NOTE — Plan of Care (Signed)

## 2023-11-02 NOTE — Discharge Summary (Signed)
 [name] FMW:[id number] DOB: 1960-03-26 DOA: 10/22/2023  PCP: Pcp, No  Admit date: 10/22/2023  Discharge date: 11/02/2023  Admitted From: Home   Disposition:  Home   Recommendations for Outpatient Follow-up:   Follow up with PCP in 1-2 weeks  PCP Please obtain BMP/CBC, 2 view CXR in 1week,  (see Discharge instructions)   PCP Please follow up on the following pending results:    Home Health: None   Equipment/Devices: None  Consultations: Renal, GI Discharge Condition: Stable    CODE STATUS: Full    Diet Recommendation: Renal diet with strict 1200 cc fluid restriction per day    Chief Complaint  Patient presents with   Respiratory Distress     Brief history of present illness from the day of admission and additional interim summary    63/F with hx of ESRD without OP HD clinic due to homelessness; was using TTS schedule via ED when adherent, hx of chronic anemia and 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  Now stable.                                                                 Hospital Course   Acute hypoxic respiratory failure secondary to volume overload/acute on chronic congestive heart failure - Volume overload/ESRD -Improved with BiPAP, HD    Acute on chronic anemia/ Duodenal AVMs -known history of AVM, hemoglobin 2 weeks ago was 9.4, she  presented with a 5.1.   Patient received a total of 4 units of prbc transfusion, hemoglbin stable around 8.5.  She underwent enteroscopy showing - LA Grade A reflux esophagitis with no bleeding,  Erythematous mucosa in the stomach,  Four angioectasias in the duodenum, one with evidence of recent bleeding. Treated with argon plasma coagulation ( APC). Monitor hemoglobin.  Continue with PPI BID and pepcid  and sucralfate  Follow-up with PCP in a week for repeat CBC.   Severe constipation, ileus - Resolved with supportive care.   ACS ruled out/demand ischemia ruled in:  Troponins were checked which were in 20s and flat indicative of demand ischemia.   No chest pain.  Elevated D-dimer but CTA negative for PE.   Hyponatremia, mild and chronic, likely from hypervolemia.  Management per nephrology, HD patient.   Social determinants: Homelessness, TOC saw and counseled the patient.  Provided with resources.   COPD/chronic cough:  no wheezing heard.  Nebs prn, continue home Breztri .  Robitussin as needed.   Hypertension:  Well controlled.  Stable and was continued on norvasc  10 mg daily, coreg  6.25 mg BID, hydralazine  prn.    HLD: Continue home Atorvastatin     Polysubstance / cocaine use: Says she has abstained from cocaine abuse and not done any for over a month.   Smoking: on nicotine  patch.    Mood disorder:  Continue home Buspar .    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  On your next visit with your primary care physician please Get Medicines reviewed and adjusted.  Please request your Prim.MD to go over all Hospital Tests and Procedure/Radiological results at the follow up, please get all Hospital records sent to your Prim MD by signing hospital release before you go home.  If you experience worsening of your admission symptoms, develop shortness of breath, life threatening emergency, suicidal or homicidal thoughts you must seek medical attention immediately by calling 911 or calling your MD immediately  if symptoms less severe.  You Must read complete instructions/literature along with all the possible adverse reactions/side effects for all the Medicines you take and that have been prescribed to you. Take any new Medicines after you have completely understood and accpet all the possible adverse reactions/side effects.   Do not drive when taking Pain medications.  Do not take more than prescribed Pain, Sleep and Anxiety Medications  Wear Seat belts while driving.   Increase activity slowly   Complete by: As directed        Discharge Medications   Allergies as of 11/02/2023       Reactions   Zestril  [lisinopril ] Other (See Comments)   Hyperkalemia        Medication List     STOP taking these medications    sertraline  25 MG tablet Commonly known as: ZOLOFT    torsemide  100 MG tablet Commonly known as: DEMADEX    traZODone  50 MG tablet Commonly known as: DESYREL        TAKE these medications    acetaminophen  325 MG tablet Commonly known as: TYLENOL  Take 2 tablets (650 mg total) by mouth every 6 (six)  hours as needed for mild pain (pain score 1-3). What changed: reasons to take this   albuterol  108 (90 Base) MCG/ACT inhaler Commonly known as: VENTOLIN  HFA Inhale 2 puffs into the lungs every 6 (six) hours as needed for wheezing or shortness of breath.   ALPRAZolam  0.25 MG tablet Commonly known as: XANAX  Take 1 tablet (0.25 mg  total) by mouth at bedtime as needed for anxiety.   amLODipine  10 MG tablet Commonly known as: NORVASC  Take 1 tablet (10 mg total) by mouth daily.   atorvastatin  10 MG tablet Commonly known as: LIPITOR Take 1 tablet (10 mg total) by mouth daily.   busPIRone  10 MG tablet Commonly known as: BUSPAR  Take 1 tablet (10 mg total) by mouth 3 (three) times daily.   calcitRIOL  0.25 MCG capsule Commonly known as: ROCALTROL  Take 1 capsule (0.25 mcg total) by mouth daily.   carvedilol  6.25 MG tablet Commonly known as: COREG  Take 1 tablet (6.25 mg total) by mouth 2 (two) times daily with a meal.   ferrous sulfate  325 (65 FE) MG tablet Commonly known as: FeroSul Take 1 tablet (325 mg total) by mouth daily with breakfast.   gabapentin  100 MG capsule Commonly known as: NEURONTIN  Take 1 capsule (100 mg total) by mouth 2 (two) times daily for 60 doses. What changed: when to take this   nicotine  21 mg/24hr patch Commonly known as: NICODERM CQ  - dosed in mg/24 hours Place 1 patch (21 mg total) onto the skin daily.   pantoprazole  40 MG tablet Commonly known as: PROTONIX  Take 1 tablet (40 mg total) by mouth 2 (two) times daily.   sodium bicarbonate  650 MG tablet Take 2 tablets (1,300 mg total) by mouth 2 (two) times daily.   sucralfate  1 GM/10ML suspension Commonly known as: CARAFATE  Take 10 mLs (1 g total) by mouth 4 (four) times daily -  with meals and at bedtime.   Trelegy Ellipta  100-62.5-25 MCG/ACT Aepb Generic drug: Fluticasone -Umeclidin-Vilant Inhale 1 puff into the lungs daily.         Follow-up Information     [facility]. Schedule an appointment as soon as possible for a visit in 1 week(s).   Contact information: [address] [place]  [address] [phone]                Major procedures and Radiology Reports - PLEASE review detailed and final reports thoroughly  -       DG Abd Portable 1V Result Date: 10/31/2023 EXAM: 1 VIEW XRAY OF THE ABDOMEN 10/31/2023 06:26:03 AM COMPARISON: 10/28/2023 CLINICAL HISTORY: Constipated. FINDINGS: BOWEL: Interval resolution of stool burden from the right colon and transverse colon. Mild-to-moderate residual stool from the splenic flexure to the rectum. Similar air filled loops of small bowel within the left lower quadrant of the abdomen. No signs of free air. SOFT TISSUES: No abnormal calcifications. No opaque urinary calculi. BONES: No acute osseous abnormality. LUNGS: Small bilateral pleural effusions noted within the imaged portions of the lower lungs. IMPRESSION: 1. Interval resolution of stool burden from the right colon and transverse colon. 2. Mild-to-moderate residual stool from the splenic flexure to the rectum. 3. Small bilateral pleural effusions. Electronically signed by: Waddell Calk MD 10/31/2023 07:07 AM EDT RP Workstation: [id number]   DG Abd 1 View Result Date: 10/28/2023 CLINICAL DATA:  Constipation skip EXAM: ABDOMEN - 1 VIEW COMPARISON:  None Available. FINDINGS: No dilated bowel loops are seen. There is diffuse gaseous distension of small bowel. There is a large amount of stool throughout the colon. There are phleboliths in the pelvis. Small pleural effusions are present. Stomach is nondilated. No acute osseous abnormality. Degenerative changes affect the lumbar spine. IMPRESSION: 1. Large amount of stool throughout the colon. 2. Diffuse gaseous distension of small bowel. 3. Small pleural effusions. Electronically Signed   By:  Greig Pique M.D.   On: 10/28/2023 23:20   DG Abd 1 View Result Date:  10/27/2023 CLINICAL DATA:  [id number] Abdominal pain [id number] EXAM: ABDOMEN - 1 VIEW COMPARISON:  October 24, 2023 FINDINGS: Nonobstructive bowel gas pattern. Moderate volume fecal loading throughout the colon. No pneumoperitoneum. No organomegaly or radiopaque calculi. No acute fracture or destructive lesion. Multilevel degenerative disc disease of the spine. Blunting of both costophrenic sulci, likely trace effusions. IMPRESSION: Nonobstructive bowel gas pattern. Moderate volume fecal loading throughout the colon, as can be seen in constipation. Electronically Signed   By: Rogelia Myers M.D.   On: 10/27/2023 16:57   DG Abd 1 View Result Date: 10/24/2023 EXAM: 1 VIEW XRAY OF THE ABDOMEN 10/24/2023 01:33:32 PM COMPARISON: Abdominal radiograph dated 05/09/2023, CT abdomen and pelvis dated 07/23/2023. CLINICAL HISTORY: [id number] SBO (small bowel obstruction) (HCC) FINDINGS: BOWEL: Nonobstructive bowel gas pattern. Large volume stool throughout the colon. SOFT TISSUES: No abnormal calcifications or opaque urinary calculi. Blunting of bilateral partially imaged costophrenic angles. BONES: No acute osseous abnormality. IMPRESSION: 1. No bowel obstruction. 2. Large volume stool throughout the colon. 3. Trace bilateral pleural effusions. Electronically signed by: Manford Cummins MD 10/24/2023 04:01 PM EDT RP Workstation: [id number]   ECHOCARDIOGRAM COMPLETE Result Date: 10/23/2023    ECHOCARDIOGRAM REPORT   Patient Name:   [name] Date of Exam: 10/23/2023 Medical Rec #:  [id number]      Height:       67.0 in Accession #:    [id number]     Weight:       140.0 lb Date of Birth:  08/02/1960      BSA:          1.738 m Patient Age:    63 years       BP:           146/74 mmHg Patient Gender: F              HR:           93 bpm. Exam Location:  Inpatient Procedure: 2D Echo, Color Doppler and Cardiac Doppler (Both Spectral and Color            Flow Doppler were utilized during procedure). Indications:    CHF I50.9  History:        Patient  has prior history of Echocardiogram examinations, most                 recent 01/25/2023.  Sonographer:    Tinnie Gosling RDCS Referring Phys: [id number] DORN DAWSON IMPRESSIONS  1. Left ventricular ejection fraction, by estimation, is 60 to 65%. The left ventricle has normal function. The left ventricle has no regional wall motion abnormalities. The left ventricular internal cavity size was mildly dilated. Left ventricular diastolic parameters are consistent with Grade II diastolic dysfunction (pseudonormalization). Elevated left atrial pressure.  2. Right ventricular systolic function is normal. The right ventricular size is normal. Tricuspid regurgitation signal is inadequate for assessing PA pressure.  3. Left atrial size was moderately dilated.  4. The mitral valve is degenerative. Mild mitral valve regurgitation. No evidence of mitral stenosis. Moderate mitral annular calcification.  5. The aortic valve was not well visualized. Aortic valve regurgitation is not visualized. No aortic stenosis is present.  6. The inferior vena cava is normal in size with greater than 50% respiratory variability, suggesting right atrial pressure of 3 mmHg. FINDINGS  Left Ventricle: Left ventricular ejection fraction, by estimation, is 60 to 65%. The left  ventricle has normal function. The left ventricle has no regional wall motion abnormalities. The left ventricular internal cavity size was mildly dilated. There is  no left ventricular hypertrophy. Left ventricular diastolic parameters are consistent with Grade II diastolic dysfunction (pseudonormalization). Elevated left atrial pressure. Right Ventricle: The right ventricular size is normal. No increase in right ventricular wall thickness. Right ventricular systolic function is normal. Tricuspid regurgitation signal is inadequate for assessing PA pressure. Left Atrium: Left atrial size was moderately dilated. Right Atrium: Right atrial size was normal in size. Pericardium: There  is no evidence of pericardial effusion. Mitral Valve: The mitral valve is degenerative in appearance. There is mild thickening of the mitral valve leaflet(s). There is mild calcification of the mitral valve leaflet(s). Moderate mitral annular calcification. Mild mitral valve regurgitation. No evidence of mitral valve stenosis. MV peak gradient, 8.3 mmHg. The mean mitral valve gradient is 5.0 mmHg. Tricuspid Valve: The tricuspid valve is normal in structure. Tricuspid valve regurgitation is not demonstrated. No evidence of tricuspid stenosis. Aortic Valve: The aortic valve was not well visualized. Aortic valve regurgitation is not visualized. No aortic stenosis is present. Pulmonic Valve: The pulmonic valve was normal in structure. Pulmonic valve regurgitation is not visualized. No evidence of pulmonic stenosis. Aorta: The aortic root is normal in size and structure. Venous: The inferior vena cava is normal in size with greater than 50% respiratory variability, suggesting right atrial pressure of 3 mmHg. IAS/Shunts: No atrial level shunt detected by color flow Doppler.  LEFT VENTRICLE PLAX 2D LVIDd:         4.80 cm   Diastology LVIDs:         3.60 cm   LV e' medial:    7.77 cm/s LV PW:         0.90 cm   LV E/e' medial:  17.1 LV IVS:        0.90 cm   LV e' lateral:   11.30 cm/s LVOT diam:     2.00 cm   LV E/e' lateral: 11.8 LV SV:         76 LV SV Index:   44 LVOT Area:     3.14 cm  RIGHT VENTRICLE             IVC RV S prime:     14.90 cm/s  IVC diam: 1.80 cm TAPSE (M-mode): 2.4 cm LEFT ATRIUM             Index        RIGHT ATRIUM           Index LA diam:        3.40 cm 1.96 cm/m   RA Area:     10.60 cm LA Vol (A2C):   85.1 ml 48.97 ml/m  RA Volume:   20.90 ml  12.03 ml/m LA Vol (A4C):   54.8 ml 31.53 ml/m LA Biplane Vol: 69.6 ml 40.05 ml/m  AORTIC VALVE LVOT Vmax:   133.00 cm/s LVOT Vmean:  93.300 cm/s LVOT VTI:    0.242 m  AORTA Ao Root diam: 2.30 cm MITRAL VALVE MV Area (PHT): 6.32 cm     SHUNTS MV Area  VTI:   2.90 cm     Systemic VTI:  0.24 m MV Peak grad:  8.3 mmHg     Systemic Diam: 2.00 cm MV Mean grad:  5.0 mmHg MV Vmax:       1.44 m/s MV Vmean:      109.0 cm/s MV Decel  Time: 120 msec MV E velocity: 133.00 cm/s MV A velocity: 151.00 cm/s MV E/A ratio:  0.88 Wilbert Bihari MD Electronically signed by Wilbert Bihari MD Signature Date/Time: 10/23/2023/5:47:34 PM    Final    CT Angio Chest Pulmonary Embolism (PE) W or WO Contrast Result Date: 10/23/2023 CLINICAL DATA:  Pulmonary embolism (PE) suspected, high prob. EXAM: CT ANGIOGRAPHY CHEST WITH CONTRAST TECHNIQUE: Multidetector CT imaging of the chest was performed using the standard protocol during bolus administration of intravenous contrast. Multiplanar CT image reconstructions and MIPs were obtained to evaluate the vascular anatomy. RADIATION DOSE REDUCTION: This exam was performed according to the departmental dose-optimization program which includes automated exposure control, adjustment of the mA and/or kV according to patient size and/or use of iterative reconstruction technique. CONTRAST:  75mL OMNIPAQUE  IOHEXOL  350 MG/ML SOLN COMPARISON:  CT angiography chest performed earlier the same day at 4:16 a.m. FINDINGS: There is satisfactory opacification of bilateral pulmonary arteries. There is no embolism to the proximal subsegmental pulmonary artery level. Multiple other findings such as cardiomegaly, bilateral pleural effusions - left more than right with associated compressive atelectatic changes, diffuse bronchial wall thickening/edema, findings favoring congestive heart failure/pulmonary edema, benign/reactive mediastinal lymph nodes, upper abdominal ascites, mild-to-moderate anasarca, diffuse sclerosed bones, most likely due to renal osteodystrophy), etc. are essentially unchanged since the prior study from earlier the same day. There is interval clearing of previously noted linear filling defect in the right bronchus intermedius/lower lobe bronchus,  suggesting interval clearing of mucus/secretions. Rest of the findings are essentially unchanged. Please refer to CTA chest report for details. Review of the MIP images confirms the above findings. IMPRESSION: 1. No embolism to the proximal subsegmental pulmonary artery level. 2. Interval clearing of previously noted linear filling defect in the right bronchus intermedius/lower lobe bronchus, suggesting interval clearing of mucus/secretions. 3. Multiple other findings are essentially unchanged since the prior study from earlier the same day. Please refer to CT Angiography chest report for details. Electronically Signed   By: Ree Molt M.D.   On: 10/23/2023 16:58   CT Angio Chest PE W and/or Wo Contrast Result Date: 10/23/2023 CLINICAL DATA:  Homeless patient with end-stage renal failure, missed last 3 weeks of dialysis. Presents in respiratory distress. Concern for pulmonary embolism. EXAM: CT ANGIOGRAPHY CHEST WITH CONTRAST TECHNIQUE: Multidetector CT imaging of the chest was performed using the standard protocol during bolus administration of intravenous contrast. Multiplanar CT image reconstructions and MIPs were obtained to evaluate the vascular anatomy. RADIATION DOSE REDUCTION: This exam was performed according to the departmental dose-optimization program which includes automated exposure control, adjustment of the mA and/or kV according to patient size and/or use of iterative reconstruction technique. CONTRAST:  75mL OMNIPAQUE  IOHEXOL  350 MG/ML SOLN COMPARISON:  Chest CT without contrast 11/15/2021, abdomen and pelvis CT without contrast 07/23/2023. FINDINGS: Cardiovascular: There is panchamber moderate cardiomegaly, increased from 07/23/2023, with left chamber predominance and venous distension. There is no substantial pericardial effusion. There is patchy three-vessel coronary artery calcification. The pulmonary arteries are upper limit of normal in caliber. Respiratory motion and pleural effusions  obscure the subsegmental arterial bed in the lower lobes but no arterial embolism is seen elsewhere. There is mild scattered calcific plaque in the aorta and great vessels without aneurysm, dissection or stenosis. Mediastinum/Nodes: There is a single prominent right mid hilar lymph node, 1 cm in short axis on 5:75. There are prominent lower right paratracheal and subcarinal lymph nodes both measuring 1.3 cm in short axis. There is no bulky, encasing  or further adenopathy including the axillary and supraclavicular spaces. Negative thyroid gland, thoracic trachea and thoracic esophagus. Lungs/Pleura: There is mild interstitial edema in the lung apices and bases. There R increased moderate-sized left and small right layering pleural effusions. Some of the right pleural fluid could be loculated laterally and anteriorly in the chest base. There is a mucoid septation in the distal right main bronchus and right lower lobe bronchus. In the lower lobes there is compressive atelectasis or consolidation alongside both pleural effusions. There is increased consolidation versus atelectasis in the lingular base, including compared with 07/23/2023. There is scattered linear scarring or atelectasis in the lower lung fields. There is diffuse bronchial thickening. No bronchial impaction is seen. Rest of the lungs appear clear. Upper Abdomen: Upper abdominal ascites, mild amount surrounding the liver and spleen and partially filling the lesser sac. There is aortic and branch vessel atherosclerosis. No free air. Musculoskeletal: Increased body wall anasarca over both prior studies. There is degenerative change and mild kyphosis thoracic spine. Dense bones consistent with renal osteodystrophy. No acute or other significant osseous findings. No mass in the visualized chest wall is seen. Review of the MIP images confirms the above findings. IMPRESSION: 1. No evidence of arterial embolus. 2. Cardiomegaly with left chamber predominance,  venous distension, and mild interstitial edema. Findings consistent with CHF or fluid overload. 3. Increased moderate-sized left and small right layering pleural effusions. Some of the right pleural fluid could be loculated laterally and anteriorly in the chest base. 4. Compressive atelectasis or consolidation in the adjacent lower lobes. 5. Increased consolidation versus atelectasis in the lingular base. 6. Diffuse bronchial thickening. 7. Mucoid septation in the distal right main bronchus and right lower lobe bronchus. 8. Upper abdominal ascites. 9. Increased body wall anasarca. 10. Renal osteodystrophy. 11. Aortic and coronary artery atherosclerosis. Aortic Atherosclerosis (ICD10-I70.0). Electronically Signed   By: Francis Quam M.D.   On: 10/23/2023 05:03   DG Chest Port 1 View Result Date: 10/23/2023 CLINICAL DATA:  Resp distress EXAM: PORTABLE CHEST 1 VIEW COMPARISON:  Chest x-ray 10/05/2023 FINDINGS: Patient is rotated. The heart and mediastinal contours are unchanged. Bibasilar airspace opacities. Chronic coarsened interstitial markings with no overt pulmonary edema. Persistent bilateral pleural effusions trace to small volume pleural effusions. No pneumothorax. No acute osseous abnormality. IMPRESSION: Persistent bilateral pleural effusions trace to small volume pleural effusions. Electronically Signed   By: Morgane  Naveau M.D.   On: 10/23/2023 00:13   DG Chest Portable 1 View Result Date: 10/05/2023 EXAM: 1 VIEW XRAY OF THE CHEST 10/05/2023 12:58:00 AM COMPARISON: 09/26/2023 CLINICAL HISTORY: SOB. Reason for exam: shortness of breath FINDINGS: LUNGS AND PLEURA: Mild interstitial edema, similar to prior. Moderate left and small right pleural effusions, unchanged. HEART AND MEDIASTINUM: Stable cardiomegaly. BONES AND SOFT TISSUES: No acute osseous abnormality. IMPRESSION: 1. Cardiomegaly with mild interstitial edema, similar. 2. Moderate left and small right pleural effusions, unchanged.  Electronically signed by: Pinkie Pebbles MD 10/05/2023 01:11 AM EDT RP Workstation: [id number]    Micro Results     Recent Results (from the past 240 hours)  MRSA Next Gen by PCR, Nasal     Status: Abnormal   Collection Time: 10/23/23  4:23 PM   Specimen: Nasal Mucosa; Nasal Swab  Result Value Ref Range Status   MRSA by PCR Next Gen DETECTED (A) NOT DETECTED Final    Comment: RESULT CALLED TO, READ BACK BY AND VERIFIED WITH: RN ROXIE HUNT [phone] AT 1826, ADC (NOTE) The GeneXpert MRSA Assay (FDA  approved for NASAL specimens only), is one component of a comprehensive MRSA colonization surveillance program. It is not intended to diagnose MRSA infection nor to guide or monitor treatment for MRSA infections. Test performance is not FDA approved in patients less than 50 years old. Performed at [facility], [address], [place], [place] [address]     Today   Subjective    [name] today has no headache,no chest abdominal pain,no new weakness tingling or numbness, feels much better wants to go home today.     Objective   Blood pressure (!) 158/68, pulse 85, temperature 99 F (37.2 C), temperature source Oral, resp. rate 18, height 5' 7 (1.702 m), weight 53.6 kg, SpO2 92%.   Intake/Output Summary (Last 24 hours) at 11/02/2023 0904 Last data filed at 11/02/2023 0202 Gross per 24 hour  Intake --  Output 2000 ml  Net -2000 ml    Exam  Awake Alert, No new F.N deficits,    [place].AT,PERRAL Supple Neck,   Symmetrical Chest wall movement, Good air movement bilaterally, CTAB RRR,No Gallops,   +ve B.Sounds, Abd Soft, Non tender,  No Cyanosis, Clubbing or edema    Data Review   Recent Labs  Lab 10/27/23 1046 10/28/23 0350 10/29/23 0302 10/29/23 1845 10/30/23 0324 10/31/23 0544  WBC 5.9 8.0 6.6  --  5.2 6.2  HGB 8.5* 7.8* 6.8* 9.0* 9.7* 8.9*  HCT 25.5* 23.9* 21.3* 28.0* 29.4* 27.9*  PLT 306 314 313  --  366 355  MCV 90.4 91.6 91.8  --  88.3 90.9  MCH 30.1  29.9 29.3  --  29.1 29.0  MCHC 33.3 32.6 31.9  --  33.0 31.9  RDW 19.4* 19.4* 19.5*  --  19.9* 19.9*  LYMPHSABS 1.1 1.2 1.3  --   --   --   MONOABS 0.9 1.2* 1.0  --   --   --   EOSABS 0.4 0.4 0.3  --   --   --   BASOSABS 0.1 0.0 0.0  --   --   --     Recent Labs  Lab 10/27/23 1046 10/28/23 0924 10/29/23 0302 10/30/23 0324 10/31/23 0544  NA 132* 127* 132* 130* 136  K 4.2 4.0 3.8 4.1 3.7  CL 99 93* 95* 94* 97*  CO2 19* 18* 22 20* 26  ANIONGAP 14 16* 15 16* 13  GLUCOSE 107* 119* 113* 82 97  BUN 64* 72* 53* 57* 28*  CREATININE 3.97* 4.25* 3.49* 3.80* 3.44*  ALBUMIN   --  2.4*  --   --   --   PHOS  --  3.5  --   --   --   CALCIUM  8.4* 8.0* 8.2* 8.5* 8.3*    Total Time in preparing paper work, data evaluation and todays exam - 35 minutes  Signature  -    Lavada Stank M.D on 11/02/2023 at 9:04 AM   -  To page go to [email or web address]

## 2023-11-02 NOTE — Progress Notes (Signed)
 Escorted Patient to Discharge lounge to get a cab voucher and wait for cab to arrive  AVS in Patient's bag

## 2023-11-02 NOTE — Progress Notes (Signed)
 Patient discharged home. Discharge instructions discussed with patient. Verbalized understanding. Written instructions give. Sent to discharge lounge. Will set up ride for patient

## 2023-11-02 NOTE — Progress Notes (Signed)
 Received patient in bed to unit.  Alert and oriented.  Informed consent signed and in chart.   TX duration: 3 hours  Patient tolerated well.  Transported back to the room  Alert, without acute distress.  Hand-off given to patient's nurse.   Access used: fistula Access issues: none  Total UF removed: Medication(s) given: none Post HD VS: 150/67 Post HD weight: 53.6     11/02/23 0202  Vitals  Temp 98.7 F (37.1 C)  Temp Source Oral  BP (!) 150/67  MAP (mmHg) 90  BP Location Right Arm  BP Method Automatic  Patient Position (if appropriate) Lying  Pulse Rate 81  Pulse Rate Source Monitor  ECG Heart Rate 80  Resp 12  Weight 53.6 kg  Type of Weight Post-Dialysis  Oxygen  Therapy  SpO2 95 %  O2 Device Room Air  O2 Flow Rate (L/min) 0 L/min  Patient Activity (if Appropriate) In bed  Pulse Oximetry Type Continuous  During Treatment Monitoring  Blood Flow Rate (mL/min) 0 mL/min  Arterial Pressure (mmHg) -68.28 mmHg  Venous Pressure (mmHg) 54.74 mmHg  TMP (mmHg) 8.48 mmHg  Ultrafiltration Rate (mL/min) 752 mL/min  Dialysate Flow Rate (mL/min) 300 ml/min  Dialysate Potassium Concentration 3  Dialysate Calcium  Concentration 2.5  Duration of HD Treatment -hour(s) 3 hour(s)  Cumulative Fluid Removed (mL) per Treatment  2000.11  Post Treatment  Dialyzer Clearance Lightly streaked  Hemodialysis Intake (mL) 0 mL  Liters Processed 53.2  Fluid Removed (mL) 2000 mL  Tolerated HD Treatment Yes  Post-Hemodialysis Comments HD tx achieved as expected, tolerated well, pt is stable.  AVG/AVF Arterial Site Held (minutes) 10 minutes  AVG/AVF Venous Site Held (minutes) 10 minutes  Note  Patient Observations (S)  pt is in bed resting comfortably.  Fistula / Graft Right Upper arm Arteriovenous fistula  Placement Date/Time: 08/14/22 1230   Orientation: Right  Access Location: (c) Upper arm  Access Type: Arteriovenous fistula  Site Condition No complications  Fistula / Graft  Assessment Present;Thrill;Bruit  Status Deaccessed  Drainage Description None    [facility]

## 2023-11-24 ENCOUNTER — Emergency Department (HOSPITAL_COMMUNITY)
Admission: EM | Admit: 2023-11-24 | Discharge: 2023-11-25 | Disposition: A | Discharge status: Home / Self Care | Payer: Self-pay | Attending: Emergency Medicine | Admitting: Emergency Medicine

## 2023-11-24 ENCOUNTER — Emergency Department (HOSPITAL_COMMUNITY): Payer: Self-pay

## 2023-11-24 DIAGNOSIS — M79671 Pain in right foot: Secondary | ICD-10-CM | POA: Insufficient documentation

## 2023-11-24 DIAGNOSIS — M79672 Pain in left foot: Secondary | ICD-10-CM | POA: Insufficient documentation

## 2023-11-24 DIAGNOSIS — M79604 Pain in right leg: Secondary | ICD-10-CM

## 2023-11-24 DIAGNOSIS — Z79899 Other long term (current) drug therapy: Secondary | ICD-10-CM | POA: Insufficient documentation

## 2023-11-24 DIAGNOSIS — I5032 Chronic diastolic (congestive) heart failure: Secondary | ICD-10-CM | POA: Insufficient documentation

## 2023-11-24 DIAGNOSIS — J810 Acute pulmonary edema: Secondary | ICD-10-CM | POA: Insufficient documentation

## 2023-11-24 DIAGNOSIS — R936 Abnormal findings on diagnostic imaging of limbs: Secondary | ICD-10-CM | POA: Insufficient documentation

## 2023-11-24 DIAGNOSIS — Z5900 Homelessness unspecified: Secondary | ICD-10-CM | POA: Insufficient documentation

## 2023-11-24 DIAGNOSIS — R262 Difficulty in walking, not elsewhere classified: Secondary | ICD-10-CM

## 2023-11-24 DIAGNOSIS — D631 Anemia in chronic kidney disease: Secondary | ICD-10-CM | POA: Insufficient documentation

## 2023-11-24 DIAGNOSIS — Z91148 Patient's other noncompliance with medication regimen for other reason: Secondary | ICD-10-CM | POA: Insufficient documentation

## 2023-11-24 DIAGNOSIS — N186 End stage renal disease: Secondary | ICD-10-CM | POA: Insufficient documentation

## 2023-11-24 DIAGNOSIS — E871 Hypo-osmolality and hyponatremia: Secondary | ICD-10-CM | POA: Insufficient documentation

## 2023-11-24 DIAGNOSIS — Z992 Dependence on renal dialysis: Secondary | ICD-10-CM | POA: Insufficient documentation

## 2023-11-24 DIAGNOSIS — J449 Chronic obstructive pulmonary disease, unspecified: Secondary | ICD-10-CM | POA: Insufficient documentation

## 2023-11-24 DIAGNOSIS — I132 Hypertensive heart and chronic kidney disease with heart failure and with stage 5 chronic kidney disease, or end stage renal disease: Secondary | ICD-10-CM | POA: Insufficient documentation

## 2023-11-24 LAB — CBC WITH DIFFERENTIAL/PLATELET
Abs Immature Granulocytes: 0.1 K/uL — ABNORMAL HIGH (ref 0.00–0.07)
Basophils Absolute: 0.1 K/uL (ref 0.0–0.1)
Basophils Relative: 1 %
Eosinophils Absolute: 0.3 K/uL (ref 0.0–0.5)
Eosinophils Relative: 3 %
HCT: 28.8 % — ABNORMAL LOW (ref 36.0–46.0)
Hemoglobin: 9.1 g/dL — ABNORMAL LOW (ref 12.0–15.0)
Immature Granulocytes: 1 %
Lymphocytes Relative: 17 %
Lymphs Abs: 1.7 K/uL (ref 0.7–4.0)
MCH: 29.6 pg (ref 26.0–34.0)
MCHC: 31.6 g/dL (ref 30.0–36.0)
MCV: 93.8 fL (ref 80.0–100.0)
Monocytes Absolute: 0.7 K/uL (ref 0.1–1.0)
Monocytes Relative: 7 %
Neutro Abs: 7.1 K/uL (ref 1.7–7.7)
Neutrophils Relative %: 71 %
Platelets: 343 K/uL (ref 150–400)
RBC: 3.07 MIL/uL — ABNORMAL LOW (ref 3.87–5.11)
RDW: 20 % — ABNORMAL HIGH (ref 11.5–15.5)
WBC: 9.9 K/uL (ref 4.0–10.5)
nRBC: 0 % (ref 0.0–0.2)

## 2023-11-24 LAB — COMPREHENSIVE METABOLIC PANEL WITH GFR
ALT: 13 U/L (ref 0–44)
AST: 27 U/L (ref 15–41)
Albumin: 2.7 g/dL — ABNORMAL LOW (ref 3.5–5.0)
Alkaline Phosphatase: 99 U/L (ref 38–126)
Anion gap: 16 — ABNORMAL HIGH (ref 5–15)
BUN: 67 mg/dL — ABNORMAL HIGH (ref 8–23)
CO2: 13 mmol/L — ABNORMAL LOW (ref 22–32)
Calcium: 7.4 mg/dL — ABNORMAL LOW (ref 8.9–10.3)
Chloride: 104 mmol/L (ref 98–111)
Creatinine, Ser: 5.7 mg/dL — ABNORMAL HIGH (ref 0.44–1.00)
GFR, Estimated: 8 mL/min — ABNORMAL LOW (ref >60)
Glucose, Bld: 104 mg/dL — ABNORMAL HIGH (ref 70–99)
Potassium: 4.1 mmol/L (ref 3.5–5.1)
Sodium: 133 mmol/L — ABNORMAL LOW (ref 135–145)
Total Bilirubin: 0.5 mg/dL (ref 0.0–1.2)
Total Protein: 6.5 g/dL (ref 6.5–8.1)

## 2023-11-24 MED ORDER — GABAPENTIN 100 MG PO CAPS
100.0000 mg | ORAL_CAPSULE | Freq: Once | ORAL | Status: AC
  Administered 2023-11-24: 100 mg via ORAL
  Filled 2023-11-24: qty 1

## 2023-11-24 MED ORDER — DOXYCYCLINE HYCLATE 100 MG PO TABS
100.0000 mg | ORAL_TABLET | Freq: Once | ORAL | Status: AC
  Administered 2023-11-24: 100 mg via ORAL
  Filled 2023-11-24: qty 1

## 2023-11-24 MED ORDER — CHLORHEXIDINE GLUCONATE CLOTH 2 % EX PADS: 6.0000 | MEDICATED_PAD | Freq: Every day | CUTANEOUS | Status: DC

## 2023-11-24 MED ORDER — BACITRACIN ZINC 500 UNIT/GM EX OINT
TOPICAL_OINTMENT | Freq: Two times a day (BID) | CUTANEOUS | Status: DC
  Administered 2023-11-24: 1 via TOPICAL
  Filled 2023-11-24: qty 1.8

## 2023-11-24 NOTE — ED Provider Triage Note (Signed)
 Emergency Medicine Provider Triage Evaluation Note  [name] , a 63 y.o. female  was evaluated in triage.  Pt complains of need for dialysis, has not had it in over a month.  Swelling and pain in bilateral lower extremities, distended abdomen.  Review of Systems  Positive: Swelling of bilateral lower extremities, left foot pain, abdominal distention Negative: Chest pain, shortness of breath, nausea, vomiting, fever, chills  Physical Exam  BP (!) 142/61   Pulse 80   Temp 97.7 F (36.5 C) (Oral)   Resp 16   Ht 5' 7 (1.702 m)   Wt 52.2 kg   SpO2 100%   BMI 18.01 kg/m  Gen:   Awake, no distress   Resp:  Normal effort  MSK:   Moves extremities without difficulty  Other:  Erythematous and swollen lower extremities, distended abdomen  Medical Decision Making  Medically screening exam initiated at 3:17 PM.  Appropriate orders placed.  Ariahna M Angelini was informed that the remainder of the evaluation will be completed by another provider, this initial triage assessment does not replace that evaluation, and the importance of remaining in the ED until their evaluation is complete.  Labs and imaging ordered   Francis Ileana SAILOR, [place] 11/24/23 8481

## 2023-11-24 NOTE — Progress Notes (Signed)
 Called by ED MD for dialysis (she has not outpatient unit due to nonadherence and homelessness).  Last had HD on 10/31/23.  Will plan for short session of HD and to be re-evaluated in the ED for possible discharge.

## 2023-11-24 NOTE — ED Notes (Signed)
 Wound covered with nonstick gauze, bacitracin , and wrapped in gauze.

## 2023-11-24 NOTE — ED Triage Notes (Signed)
 Pt BIB EMS from hotel, for needing dialysis, hasn't had it in over a month and not on a regular schedule since November 2024. Pt noncompliant and unable to get to dialysis appointments. Homeless. Still producing urine. Swelling and pain legs bilat, distended abdomen. AOX4.

## 2023-11-24 NOTE — ED Provider Notes (Signed)
 [place] [facility]PART[facility] AT [facility] Provider Note   CSN: [id number] Arrival date & time: 11/24/23  1450     Patient presents with: Needs Dialysis and Homeless   [name] is a 63 y.o. female.  {Add pertinent medical, surgical, social history, OB history to HPI:32947} HPI     Prior to Admission medications   Medication Sig Start Date End Date Taking? Authorizing Provider  acetaminophen  (TYLENOL ) 325 MG tablet Take 2 tablets (650 mg total) by mouth every 6 (six) hours as needed for mild pain (pain score 1-3). 11/02/23   Dennise Lavada POUR, MD  albuterol  (VENTOLIN  HFA) 108 (90 Base) MCG/ACT inhaler Inhale 2 puffs into the lungs every 6 (six) hours as needed for wheezing or shortness of breath. 11/02/23   Singh, Prashant K, MD  ALPRAZolam  (XANAX ) 0.25 MG tablet Take 1 tablet (0.25 mg total) by mouth at bedtime as needed for anxiety. 11/02/23   Dennise Lavada POUR, MD  amLODipine  (NORVASC ) 10 MG tablet Take 1 tablet (10 mg total) by mouth daily. 11/02/23   Dennise Lavada POUR, MD  atorvastatin  (LIPITOR) 10 MG tablet Take 1 tablet (10 mg total) by mouth daily. 11/02/23   Dennise Lavada POUR, MD  busPIRone  (BUSPAR ) 10 MG tablet Take 1 tablet (10 mg total) by mouth 3 (three) times daily. 11/02/23 12/02/23  Dennise Lavada POUR, MD  calcitRIOL  (ROCALTROL ) 0.25 MCG capsule Take 1 capsule (0.25 mcg total) by mouth daily. 11/02/23   Singh, Prashant K, MD  carvedilol  (COREG ) 6.25 MG tablet Take 1 tablet (6.25 mg total) by mouth 2 (two) times daily with a meal. 11/02/23   Dennise Lavada POUR, MD  ferrous sulfate  (FEROSUL) 325 (65 FE) MG tablet Take 1 tablet (325 mg total) by mouth daily with breakfast. 11/02/23   Singh, Prashant K, MD  gabapentin  (NEURONTIN ) 100 MG capsule Take 1 capsule (100 mg total) by mouth 2 (two) times daily for 60 doses. 11/02/23 12/02/23  Dennise Lavada POUR, MD  nicotine  (NICODERM CQ  - DOSED IN MG/24 HOURS) 21 mg/24hr patch Place 1 patch (21 mg total) onto the skin daily.  11/02/23   Dennise Lavada POUR, MD  pantoprazole  (PROTONIX ) 40 MG tablet Take 1 tablet (40 mg total) by mouth 2 (two) times daily. 11/02/23 12/02/23  Singh, Prashant K, MD  sodium bicarbonate  650 MG tablet Take 2 tablets (1,300 mg total) by mouth 2 (two) times daily. 11/02/23 01/31/24  Dennise Lavada POUR, MD  sucralfate  (CARAFATE ) 1 GM/10ML suspension Take 10 mLs (1 g total) by mouth 4 (four) times daily -  with meals and at bedtime. 11/02/23   Singh, Prashant K, MD  TRELEGY ELLIPTA  100-62.5-25 MCG/ACT AEPB Inhale 1 puff into the lungs daily. 11/02/23   Dennise Lavada POUR, MD    Allergies: Zestril  [lisinopril ]    Review of Systems  Updated Vital Signs BP (!) 154/60 (BP Location: Left Arm)   Pulse 81   Temp 98.7 F (37.1 C)   Resp 18   Ht 5' 7 (1.702 m)   Wt 52.2 kg   SpO2 100%   BMI 18.01 kg/m   Physical Exam  (all labs ordered are listed, but only abnormal results are displayed) Labs Reviewed  CBC WITH DIFFERENTIAL/PLATELET - Abnormal; Notable for the following components:      Result Value   RBC 3.07 (*)    Hemoglobin 9.1 (*)    HCT 28.8 (*)    RDW 20.0 (*)    Abs Immature Granulocytes 0.10 (*)  All other components within normal limits  COMPREHENSIVE METABOLIC PANEL WITH GFR - Abnormal; Notable for the following components:   Sodium 133 (*)    CO2 13 (*)    Glucose, Bld 104 (*)    BUN 67 (*)    Creatinine, Ser 5.70 (*)    Calcium  7.4 (*)    Albumin  2.7 (*)    GFR, Estimated 8 (*)    Anion gap 16 (*)    All other components within normal limits    EKG: None  Radiology: DG Chest 2 View Result Date: 11/24/2023 CLINICAL DATA:  Dialysis patient EXAM: CHEST - 2 VIEW COMPARISON:  10/22/2023 FINDINGS: Small bilateral pleural effusions. Cardiomegaly with vascular congestion and mild edema. No pneumothorax. IMPRESSION: Cardiomegaly with vascular congestion and mild edema. Small bilateral pleural effusions. Electronically Signed   By: Luke Bun M.D.   On: 11/24/2023 21:36    DG Foot Complete Left Result Date: 11/24/2023 CLINICAL DATA:  Pain EXAM: LEFT FOOT - COMPLETE 3+ VIEW COMPARISON:  February 24, 2015 FINDINGS: Osteopenia.No acute fracture or dislocation. Healed fractures of the second through fifth metatarsals. There is no evidence of arthropathy or other focal bone abnormality. Similar appearance of a radiopaque BB overlying the anterior aspect of the distal fibula. In the webspace between the first and second digits, there is a 3 mm linear radiopaque structure. IMPRESSION: 1. Osteopenia.  No acute fracture or dislocation. 2. In the webspace between the first and second digits, there is a 3 mm linear radiopaque structure, worrisome for a subcutaneous foreign body. Electronically Signed   By: Rogelia Myers M.D.   On: 11/24/2023 16:53    {Document cardiac monitor, telemetry assessment procedure when appropriate:32947} Procedures   Medications Ordered in the ED  gabapentin  (NEURONTIN ) capsule 100 mg (100 mg Oral Given 11/24/23 2142)    Clinical Course as of 11/24/23 2334  Mon Nov 24, 2023  2202 I personally discussed the case with Dr. Rayburn with the nephrology service who will arrange to have the patient dialyzed while in the emergency department, there is some pulmonary edema on the x-ray but is not severe, the patient is not hypoxic tachycardic severely hypertensive or hyperkalemic.  She does have a foreign body in the foot but refuses to tell me how it got there, she states she does not inject in that area, she actually has no significant tenderness no signs of wound breakdown, no openings, no purulence, no redness, she has generally poor skin and several areas where she has some abrasions but nothing that looks overtly infected.  Referred to podiatry, antibiotics [BM]    Clinical Course User Index [BM] Cleotilde Rogue, MD   {Click here for ABCD2, HEART and other calculators REFRESH Note before signing:1}                              Medical Decision  Making Amount and/or Complexity of Data Reviewed Radiology: ordered.  Risk Prescription drug management.   ***  {Document critical care time when appropriate  Document review of labs and clinical decision tools ie CHADS2VASC2, etc  Document your independent review of radiology images and any outside records  Document your discussion with family members, caretakers and with consultants  Document social determinants of health affecting pt's care  Document your decision making why or why not admission, treatments were needed:32947:::1}   Final diagnoses:  None    ED Discharge Orders     None

## 2023-11-25 ENCOUNTER — Other Ambulatory Visit (HOSPITAL_COMMUNITY): Payer: Self-pay

## 2023-11-25 LAB — HEPATITIS B SURFACE ANTIGEN: Hepatitis B Surface Ag: NONREACTIVE

## 2023-11-25 MED ORDER — DOXYCYCLINE HYCLATE 100 MG PO CAPS: 100.0000 mg | ORAL_CAPSULE | Freq: Two times a day (BID) | ORAL | 0 refills | Status: DC

## 2023-11-25 MED ORDER — TORSEMIDE 100 MG PO TABS
100.0000 mg | ORAL_TABLET | Freq: Every day | ORAL | 0 refills | Status: DC
Start: 2023-11-25 — End: 2024-01-16
  Filled 2023-11-25: qty 30, 30d supply, fill #0

## 2023-11-25 MED ORDER — OXYCODONE HCL 5 MG PO TABS: 5.0000 mg | ORAL_TABLET | ORAL | 0 refills | Status: DC | PRN

## 2023-11-25 MED ORDER — OXYCODONE HCL 5 MG PO TABS
5.0000 mg | ORAL_TABLET | Freq: Once | ORAL | Status: AC
  Administered 2023-11-25: 5 mg via ORAL
  Filled 2023-11-25: qty 1

## 2023-11-25 MED ORDER — LIDOCAINE HCL (PF) 1 % IJ SOLN: 5.0000 mL | INTRAMUSCULAR | Status: DC | PRN

## 2023-11-25 MED ORDER — NEPRO/CARBSTEADY PO LIQD: 237.0000 mL | ORAL | Status: DC | PRN

## 2023-11-25 MED ORDER — FUROSEMIDE 20 MG PO TABS
80.0000 mg | ORAL_TABLET | Freq: Once | ORAL | Status: AC
  Administered 2023-11-25: 80 mg via ORAL
  Filled 2023-11-25: qty 4

## 2023-11-25 MED ORDER — FUROSEMIDE 10 MG/ML IJ SOLN
120.0000 mg | Freq: Once | INTRAVENOUS | Status: DC
  Filled 2023-11-25: qty 12

## 2023-11-25 MED ORDER — LIDOCAINE-PRILOCAINE 2.5-2.5 % EX CREA: 1.0000 | TOPICAL_CREAM | CUTANEOUS | Status: DC | PRN

## 2023-11-25 MED ORDER — TORSEMIDE 100 MG PO TABS
100.0000 mg | ORAL_TABLET | Freq: Every day | ORAL | 0 refills | Status: DC
Start: 2023-11-25 — End: 2023-11-25

## 2023-11-25 MED ORDER — OXYCODONE HCL 5 MG PO TABS
5.0000 mg | ORAL_TABLET | ORAL | 0 refills | Status: DC | PRN
  Filled 2023-11-25: qty 15, 3d supply, fill #0

## 2023-11-25 MED ORDER — HEPARIN SODIUM (PORCINE) 1000 UNIT/ML DIALYSIS: 1000.0000 [IU] | INTRAMUSCULAR | Status: DC | PRN

## 2023-11-25 MED ORDER — ALTEPLASE 2 MG IJ SOLR: 2.0000 mg | Freq: Once | INTRAMUSCULAR | Status: DC | PRN

## 2023-11-25 MED ORDER — PENTAFLUOROPROP-TETRAFLUOROETH EX AERO: 1.0000 | INHALATION_SPRAY | CUTANEOUS | Status: DC | PRN

## 2023-11-25 MED ORDER — DOXYCYCLINE HYCLATE 100 MG PO TABS
100.0000 mg | ORAL_TABLET | Freq: Two times a day (BID) | ORAL | 0 refills | Status: AC
  Filled 2023-11-25: qty 14, 7d supply, fill #0

## 2023-11-25 MED ORDER — ANTICOAGULANT SODIUM CITRATE 4% (200MG/5ML) IV SOLN: 5.0000 mL | Status: DC | PRN

## 2023-11-25 NOTE — ED Notes (Addendum)
 Pt returned from dialysis.  Pt sts they messed up my arm.  Pt noted to be crying loudly r/t BLE and R arm pain.    Per Consulting civil engineer, Pt was unable to complete dialysis d/t issues w/ the machine.  Pt refused 2nd attempt.

## 2023-11-25 NOTE — ED Provider Notes (Signed)
 Spoke w/ Dr Norine w/ nephro Unable to perform HD today, she has ordered high dose lasix  Neophrology recommending torsemide  Pt tolerating po, able to eat meal here, no hypoxia Will give rx, also analgesia, homeless resources  Patient in no distress and overall condition is stable. Detailed discussions were had with the patient/guardian regarding current findings, and need for close f/u with PCP or on call doctor. The patient/guardian has been instructed to return immediately if the symptoms worsen in any way for re-evaluation. Patient/guardian verbalized understanding and is in agreement with current care plan. All questions answered prior to discharge.    Elnor Savant A, DO 11/25/23 1450

## 2023-11-25 NOTE — ED Notes (Signed)
 Pt verbalized understanding of discharge instructions. Opportunity for questions provided.

## 2023-11-25 NOTE — ED Notes (Signed)
Unsuccessful IV attempt.  Will consult IV team.  °

## 2023-11-25 NOTE — Consult Note (Signed)
 [facility]  INPATIENT CONSULTATION  Reason for Consultation: volume overload Requesting Provider: Dr. Elnor  HPI: [name] is an 63 y.o. female with ESRD not on HD regularly (no outpt unit, occasionally comes to hosp for HD), COPD, homelessness (declines SNF), h/o GIB, h/o substance abuse who presents for LE edema.   Patient presented overnight for swelling.  Nephrology was consulted and she often receives dialysis when she presents so orders were placed.  Attempt to cannulate AVF was unsuccessful (though it's patent) and she declined a reattempt.    When I see her in the ED she says dialysis doesn't help improve any of her symptoms - Her BUN is 67, K 4.1.  She has ascites and LE edema with some venous stasis ulcers and fluid filled bullae visible below dressing wrapping her tibias.  She has been out of her diuretics.  She remains homeless.   PMH: Past Medical History:  Diagnosis Date   Acute on chronic blood loss anemia 08/01/2022   Acute on chronic diastolic CHF (congestive heart failure) (HCC) 08/12/2022   Acute on chronic respiratory failure with hypoxia (HCC) 05/16/2023   Acute pulmonary edema (HCC) 05/16/2023   Acute seasonal allergic rhinitis due to pollen 02/22/2016   Alcohol  abuse    Allergy    Anxiety    Arteriovenous fistula infection, initial encounter (HCC) 04/16/2023   Arthritis    Asthma    Cannabis abuse    Cocaine abuse (HCC)    COPD (chronic obstructive pulmonary disease) (HCC)    COPD with acute exacerbation (HCC) 10/25/2022   COVID 05/16/2023   Depression    Drug addiction (HCC)    GERD (gastroesophageal reflux disease)    GIB (gastrointestinal bleeding) 08/19/2019   Heart murmur    Heme positive stool 08/14/2022   Homelessness    Hx of adenomatous colonic polyps    Hyperlipidemia    Hypertension    pt stated every once in a while BP will be high but has not been prescribed medication for HTN.    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) 02/22/2016   SIRS (systemic inflammatory response syndrome) (HCC) 10/25/2022   PSH: Past Surgical History:  Procedure Laterality Date   A/V FISTULAGRAM Right 01/31/2023   Procedure: A/V Fistulagram;  Surgeon: Gretta Lonni PARAS, MD;  [facility] INV[facility]IVE [facility] LAB;  Service: Cardiovascular;  Laterality: Right;   AV FISTULA PLACEMENT Right 08/14/2022   Procedure: RIGHT ARM BRACHIOBASILIC ATERIOVENOUS FISTULA CREATION;  Surgeon: Magda Debby SAILOR, MD;  Location: [facility] OR;  Service: Vascular;  Laterality: Right;   BASCILIC VEIN TRANSPOSITION Right 11/13/2022   Procedure: RIGHT ARM SECOND STAGE BASILIC VEIN TRANSPOSITION;  Surgeon: Magda Debby SAILOR, MD;  Location: [facility] OR;  Service: Vascular;  Laterality: Right;   BIOPSY  01/01/2019   Procedure: BIOPSY;  Surgeon: Albertus Gordy HERO, MD;  Location: [facility] ENDOSCOPY;  Service: Endoscopy;;   BIOPSY  11/17/2021   Procedure: BIOPSY;  Surgeon: Charlanne Groom, MD;  Location: [facility] ENDOSCOPY;  Service: Gastroenterology;;   CESAREAN SECTION  1989   COLONOSCOPY  11/07/2020   2018   CYSTOSCOPY W/ URETERAL STENT PLACEMENT Left 08/01/2018   Procedure: CYSTOSCOPY WITH RETROGRADE PYELOGRAM/URETERAL STENT PLACEMENT;  Surgeon: Carolee,  Sherwood JONETTA MOULD, MD;  Location: [facility] ORS;  Service: Urology;  Laterality: Left;   CYSTOSCOPY WITH RETROGRADE PYELOGRAM, URETEROSCOPY AND STENT PLACEMENT Left 01/15/2019   Procedure: CYSTOSCOPY WITH RETROGRADE PYELOGRAM, URETEROSCOPY AND STENT PLACEMENT;  Surgeon: Carolee Sherwood JONETTA MOULD, MD;  [facility]: [facility] ORS;  Service: Urology;  Laterality: Left;   ENTEROSCOPY N/A 08/16/2022   Procedure: ENTEROSCOPY;  Surgeon: Wilhelmenia Aloha Raddle., MD;  Location: [facility] ENDOSCOPY;  Service: Gastroenterology;   Laterality: N/A;   ENTEROSCOPY N/A 10/31/2022   Procedure: ENTEROSCOPY;  Surgeon: Albertus Gordy HERO, MD;  Location: [facility] ENDOSCOPY;  Service: Gastroenterology;  Laterality: N/A;   ENTEROSCOPY N/A 05/09/2023   Procedure: ENTEROSCOPY;  Surgeon: Federico Rosario BROCKS, MD;  Location: [facility] ENDOSCOPY;  Service: Gastroenterology;  Laterality: N/A;   ENTEROSCOPY N/A 09/09/2023   Procedure: ENTEROSCOPY;  Surgeon: Federico Rosario BROCKS, MD;  Location: [facility] ENDOSCOPY;  Service: Gastroenterology;  Laterality: N/A;   ENTEROSCOPY N/A 10/26/2023   Procedure: ENTEROSCOPY;  Surgeon: Albertus Gordy HERO, MD;  Location: [facility] ENDOSCOPY;  Service: Gastroenterology;  Laterality: N/A;   ESOPHAGOGASTRODUODENOSCOPY (EGD) WITH PROPOFOL  N/A 01/01/2019   Procedure: ESOPHAGOGASTRODUODENOSCOPY (EGD) WITH PROPOFOL ;  Surgeon: Albertus Gordy HERO, MD;  [facility] ENDOS[facility];  Service: Endoscopy;  Laterality: N/A;   ESOPHAGOGASTRODUODENOSCOPY (EGD) WITH PROPOFOL  N/A 08/21/2019   Procedure: ESOPHAGOGASTRODUODENOSCOPY (EGD) WITH PROPOFOL ;  Surgeon: Shila Gustav GAILS, MD;  [facility] ENDOSCOPY;  Service: Endoscopy;  Laterality: N/A;   ESOPHAGOGASTRODUODENOSCOPY (EGD) WITH PROPOFOL  N/A 11/17/2021   Procedure: ESOPHAGOGASTRODUODENOSCOPY (EGD) WITH PROPOFOL ;  Surgeon: Charlanne Groom, MD;  [facility] ENDOS[facility];  Service: Gastroenterology;  Laterality: N/A;   FRACTURE SURGERY Left 2011   arm   HEMOSTASIS CLIP PLACEMENT  08/16/2022   Procedure: HEMOSTASIS CLIP PLACEMENT;  Surgeon: Wilhelmenia Aloha Raddle., MD;  Location: [facility] ENDOSCOPY;  Service: Gastroenterology;;   HOT HEMOSTASIS N/A 08/16/2022   Procedure: HOT HEMOSTASIS (ARGON PLASMA COAGULATION/BICAP);  Surgeon: Wilhelmenia Aloha Raddle., MD;  Location: [facility] ENDOSCOPY;  Service: Gastroenterology;  Laterality: N/A;   HOT HEMOSTASIS N/A 10/31/2022   Procedure: HOT HEMOSTASIS (ARGON PLASMA COAGULATION/BICAP);  Surgeon: Albertus Gordy HERO, MD;  Location: [facility] ENDOSCOPY;  Service: Gastroenterology;  Laterality: N/A;   HOT HEMOSTASIS N/A  05/09/2023   Procedure: HOT HEMOSTASIS (ARGON PLASMA COAGULATION/BICAP);  Surgeon: Federico Rosario BROCKS, MD;  Location: [facility] ENDOSCOPY;  Service: Gastroenterology;  Laterality: N/A;   HOT HEMOSTASIS N/A 09/09/2023   Procedure: EGD, WITH ARGON PLASMA COAGULATION;  Surgeon: Federico Rosario BROCKS, MD;  Location: [facility] ENDOSCOPY;  Service: Gastroenterology;  Laterality: N/A;   INSERTION OF DIALYSIS CATHETER Right 08/14/2022   Procedure: INSERTION OF TUNNELED DIALYSIS CATHETER USING PALINDROME CATHETER KIT 19CM;  Surgeon: Magda Debby SAILOR, MD;  Location: [facility] OR;  Service: Vascular;  Laterality: Right;   IR THORACENTESIS ASP PLEURAL SPACE W/IMG GUIDE  09/15/2023   POLYPECTOMY  08/16/2022   Procedure: POLYPECTOMY;  Surgeon: Mansouraty, Aloha Raddle., MD;  Location: [facility] ENDOSCOPY;  Service: Gastroenterology;;   SUBMUCOSAL TATTOO INJECTION  08/16/2022   Procedure: SUBMUCOSAL TATTOO INJECTION;  Surgeon: Wilhelmenia Aloha Raddle., MD;  Location: [facility] ENDOSCOPY;  Service: Gastroenterology;;   UPPER GASTROINTESTINAL ENDOSCOPY      Past Medical History:  Diagnosis Date   Acute on chronic blood loss anemia 08/01/2022   Acute on chronic diastolic CHF (congestive heart failure) (HCC) 08/12/2022   Acute on chronic respiratory failure with hypoxia (HCC) 05/16/2023   Acute pulmonary edema (HCC) 05/16/2023   Acute seasonal allergic rhinitis due to pollen 02/22/2016   Alcohol  abuse    Allergy  Anxiety    Arteriovenous fistula infection, initial encounter (HCC) 04/16/2023   Arthritis    Asthma    Cannabis abuse    Cocaine abuse (HCC)    COPD (chronic obstructive pulmonary disease) (HCC)    COPD with acute exacerbation (HCC) 10/25/2022   COVID 05/16/2023   Depression    Drug addiction (HCC)    GERD (gastroesophageal reflux disease)    GIB (gastrointestinal bleeding) 08/19/2019   Heart murmur    Heme positive stool 08/14/2022   Homelessness    Hx of adenomatous colonic polyps    Hyperlipidemia    Hypertension    pt stated every  once in a while BP will be high but has not been prescribed medication for HTN.    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  (Not in a hospital admission)   ALLERGIES:   Allergies  Allergen Reactions   Zestril  [Lisinopril ] Other (See Comments)    Hyperkalemia    FAM HX: Family History  Problem Relation Age of Onset   Diabetes Father    Colon cancer Neg Hx    Esophageal cancer Neg Hx    Rectal cancer Neg Hx    Stomach cancer Neg Hx     Social History:   reports that she has been smoking cigarettes. She has been exposed to tobacco smoke. She has never used smokeless tobacco. She reports that she does not currently use alcohol . She reports current drug use. Drug: Marijuana.  ROS: 12 system ROS neg except per HPI   Blood pressure (!) 183/87, pulse 88, temperature 98 F (36.7 C), temperature source Oral, resp. rate 18, height 5' 7 (1.702 m), weight 52.2 kg, SpO2 100%. PHYSICAL EXAM: Gen: chronically ill woman  Eyes: EOMI ENT:MMM Neck: supple CV: RRR Abd: firm, tense, nontender she says its not that bad Lungs: frequent coughing, prolonged exp phase, no rales Extr: 2+ pitting LE edema with venous stasis ulcers on ankles and wraps over tibial skin Neuro: nonfocal globally    Results for orders placed or performed during the hospital encounter of 11/24/23 (from the past 48 hours)  CBC with Differential     Status: Abnormal   Collection Time: 11/24/23  3:52 PM  Result Value Ref Range   WBC 9.9 4.0 - 10.5 K/uL   RBC 3.07 (L) 3.87 - 5.11 MIL/uL   Hemoglobin 9.1  (L) 12.0 - 15.0 g/dL   HCT 71.1 (L) 63.9 - 53.9 %   MCV 93.8 80.0 - 100.0 fL   MCH 29.6 26.0 - 34.0 pg   MCHC 31.6 30.0 - 36.0 g/dL   RDW 79.9 (H) 88.4 - 84.4 %   Platelets 343 150 - 400 K/uL   nRBC 0.0 0.0 - 0.2 %   Neutrophils Relative % 71 %   Neutro Abs 7.1 1.7 - 7.7 K/uL   Lymphocytes Relative 17 %   Lymphs Abs 1.7 0.7 - 4.0 K/uL   Monocytes Relative 7 %   Monocytes Absolute 0.7 0.1 - 1.0 K/uL   Eosinophils Relative 3 %   Eosinophils Absolute 0.3 0.0 - 0.5 K/uL  Basophils Relative 1 %   Basophils Absolute 0.1 0.0 - 0.1 K/uL   Immature Granulocytes 1 %   Abs Immature Granulocytes 0.10 (H) 0.00 - 0.07 K/uL    Comment: Performed at [facility], [address] [place], [place] [address]  Comprehensive metabolic panel     Status: Abnormal   Collection Time: 11/24/23  3:52 PM  Result Value Ref Range   Sodium 133 (L) 135 - 145 mmol/L   Potassium 4.1 3.5 - 5.1 mmol/L   Chloride 104 98 - 111 mmol/L   CO2 13 (L) 22 - 32 mmol/L   Glucose, Bld 104 (H) 70 - 99 mg/dL    Comment: Glucose reference range applies only to samples taken after fasting for at least 8 hours.   BUN 67 (H) 8 - 23 mg/dL   Creatinine, Ser 4.29 (H) 0.44 - 1.00 mg/dL   Calcium  7.4 (L) 8.9 - 10.3 mg/dL   Total Protein 6.5 6.5 - 8.1 g/dL   Albumin  2.7 (L) 3.5 - 5.0 g/dL   AST 27 15 - 41 U/L   ALT 13 0 - 44 U/L   Alkaline Phosphatase 99 38 - 126 U/L   Total Bilirubin 0.5 0.0 - 1.2 mg/dL   GFR, Estimated 8 (L) >60 mL/min    Comment: (NOTE) Calculated using the CKD-EPI Creatinine Equation (2021)    Anion gap 16 (H) 5 - 15    Comment: Performed at [facility], [address], [place], [place] [address]  Hepatitis B surface antigen     Status: None   Collection Time: 11/25/23  4:00 AM  Result Value Ref Range   Hepatitis B Surface Ag NON REACTIVE NON REACTIVE    Comment: Performed at [facility], [address] [place], [place] [address]    DG Chest 2 View Result Date: 11/24/2023 CLINICAL  DATA:  Dialysis patient EXAM: CHEST - 2 VIEW COMPARISON:  10/22/2023 FINDINGS: Small bilateral pleural effusions. Cardiomegaly with vascular congestion and mild edema. No pneumothorax. IMPRESSION: Cardiomegaly with vascular congestion and mild edema. Small bilateral pleural effusions. Electronically Signed   By: Luke Bun M.D.   On: 11/24/2023 21:36   DG Foot Complete Left Result Date: 11/24/2023 CLINICAL DATA:  Pain EXAM: LEFT FOOT - COMPLETE 3+ VIEW COMPARISON:  February 24, 2015 FINDINGS: Osteopenia.No acute fracture or dislocation. Healed fractures of the second through fifth metatarsals. There is no evidence of arthropathy or other focal bone abnormality. Similar appearance of a radiopaque BB overlying the anterior aspect of the distal fibula. In the webspace between the first and second digits, there is a 3 mm linear radiopaque structure. IMPRESSION: 1. Osteopenia.  No acute fracture or dislocation. 2. In the webspace between the first and second digits, there is a 3 mm linear radiopaque structure, worrisome for a subcutaneous foreign body. Electronically Signed   By: Rogelia Myers M.D.   On: 11/24/2023 16:53    Assessment/PlanMelodye M [name] is an 63 y.o. female with ESRD not on HD regularly (no outpt unit, occasionally comes to hosp for HD), COPD, homelessness (declines SNF), h/o GIB, h/o substance abuse who presents for LE edema.   **ESRD: has significant residual renal function. Presents to the hospital every few months and has a few HD sessions that don't help her feel better she reports.  Seems more like CKD 5 with poor adherence to care.  Attempt for dialysis today unsuccessful due to AVF issue.  I don't see any reason to  reattempt HD at this time - she will benefit from diuretics but has no other indications for dialysis.   **Volume overload: has been out of diuretics, will give lasix  IV in ED now.  If discharges would send out with rx for torsemide  100 daily.  Low na diet as able.    **Hyponatremia:  Mild, hypervolemia related, diuretic per above.   **Anemia:  Hb 9s.   If is admitted will follow.  Unfortunately she has a poor prognosis with her multiple medical problems and social issues.   [name] 11/25/2023, 2:42 PM

## 2023-11-25 NOTE — ED Provider Notes (Signed)
 Provider Note MRN:  [id number]  Arrival date & time: 11/25/23    ED Course and Medical Decision Making  Pt returned from HD, care assumed from prior team  See note from prior team for complete details, in brief:  Pt homeless Was here for HD Difficulty encountered with performing HD last night, unable to be completed Will give patient something to eat and analgesic Spoke w/ Dr Norine, plan to retry hemodialysis this afternoon    Procedures  Final Clinical Impressions(s) / ED Diagnoses     ICD-10-CM   1. Bilateral leg and foot pain  M79.604    M79.605    M79.671    M79.672     2. Abnormal plain x-ray of foot  R93.6     3. Acute pulmonary edema (HCC)  J81.0     4. ESRD on dialysis (HCC)  N18.6    Z99.2     5. Homeless  Z59.00       ED Discharge Orders          Ordered    doxycycline  (VIBRAMYCIN ) 100 MG capsule  2 times daily        11/25/23 0011              Discharge Instructions      It was a pleasure taking care of you today.  Based on your history, physical exam, labs, and imaging I feel you are safe for discharge.  You were also given an antibiotic to treat a possible foot infection.  Please take the antibiotic as prescribed and complete the entire course.  You have also been given information for a follow-up with podiatry due to the possible foreign body in your foot as well as your foot wounds.  Please make an appointment as soon as possible and follow-up. If possible please follow-up within 48 hours for re-check as well as foreign body evaluation. Please establish with a dialysis center as soon as possible and keep up with your dialysis.  If you experience any of the following symptoms including but not limited to fever, chills, chest pain, shortness of breath, abdominal pain, worsening foot pain, or worsening signs of foot infection including redness, drainage, severe pain, or other concerning symptom please return to the emergency department.  Please make  sure to keep the wounds on your feet clean and dry, please continue to wash them well and keep them covered. Please make your primary care provider aware of your work-up today and findings, including your abnormal foot xray. Recommend rest, ice, compression, and elevation to help with pain. Recommend tylenol  to help with pain as well as your prescribed medications. The max daily dose of tylenol  is 4000mg /day.     RESOURCE GUIDE  Chronic Pain Problems: Contact [facility]  [phone] Patients need to be referred by their primary care doctor.  Insufficient Money for Medicine: Contact [employer]:  call [phone]  No Primary Care Doctor: Call [facility]  [phone] - can help you locate a primary care doctor that  accepts your insurance, provides certain services, etc. Physician Referral Service- [phone]  Agencies that provide inexpensive medical care: [facility]  [phone] [facility]  [phone] [facility]  [phone] [facility]  [phone] [facility]  [phone] [facility]  [phone]  Medicaid-accepting [facility] Providers: [facility]- [address]  [phone], Mon-Fri 9am-7pm, Sat 9am-1pm [facility]- [address] [place], Suite  [phone] [facility]- [address] [place]  [phone] [facility]- [address]  [phone] Kennieth Leech- [address] [place], [address], [phone]  Only accepts [place] patients after they have their name  applied to their card  Self Pay (no insurance) in [facility]: Sickle Cell Patients - [facility]  [address] [place], [phone] [facility]- [address] [place]  [phone]       [place] [facility] [place]- [address]       -     [facility]- see information  above (Speak to [employer] if you do not have insurance)       -  [facility]- [address],  [phone]       -  [facility]- [address], [phone]       -  Dr Catalina-  [address], [place], [phone]       -  [facility] - [address], [phone]       -  [facility]- [address] [phone], also [address] [phone]       -     [facility]- [address] [place], [phone], 1st & 3rd Saturday         every month, 10am-1pm  -     [facility]   [address] [place], [place].   Phone:  [phone], Fax:  [phone]. Hours of Operation:  9 am - 6 pm, M-F.  -     [facility]   [address], [place]   Phone: [phone], Fax: [phone]. Hours of Operation:  8:30 am - 5:30 pm, M-F.    Dental Assistance If unable to pay or uninsured, contact:  [facility]. to become qualified for the adult dental clinic.  Patients with Medicaid: [facility] [phone], [phone] [address] [phone]  If unable to pay, or uninsured, contact [facility] [phone] in [place], [phone] in [facility]) to become qualified for the adult dental clinic  [facility] [address] [place], [place] [address] [phone] [email or web address]  Other Proofreader Services: [facility]- [address] [place], [place], [place], [address], [phone], Ext. [phone], 2nd and 4th Thursday of the month at 6:30am.  10 clients each day by appointment, can sometimes see walk-in patients if someone does not show for an appointment. [facility]- [address] [place], [place], [address], [phone] [facility] [address], [place], [place], [address], [phone] [facility] Health Department- [phone] [facility] Health  Department- [phone] [facility] Department- [phone]           Elnor Jayson LABOR, DO 11/25/23 [phone]

## 2023-11-25 NOTE — ED Notes (Signed)
 Patient transported to dialysis

## 2023-11-25 NOTE — ED Notes (Signed)
 Upon discharge, Pt informed ED staff that she needs her medications filled by the [facility] pharmacy.  EDP made aware.

## 2023-11-25 NOTE — ED Notes (Signed)
 Pt has been given her medications from [facility] and a taxi voucher.

## 2023-11-25 NOTE — Discharge Instructions (Addendum)
 It was a pleasure taking care of you today.  Based on your history, physical exam, labs, and imaging I feel you are safe for discharge.  You were also given an antibiotic to treat a possible foot infection.  Please take the antibiotic as prescribed and complete the entire course.  You have also been given information for a follow-up with podiatry due to the possible foreign body in your foot as well as your foot wounds.  Please make an appointment as soon as possible and follow-up. If possible please follow-up within 48 hours for re-check as well as foreign body evaluation. Please establish with a dialysis center as soon as possible and keep up with your dialysis.  If you experience any of the following symptoms including but not limited to fever, chills, chest pain, shortness of breath, abdominal pain, worsening foot pain, or worsening signs of foot infection including redness, drainage, severe pain, or other concerning symptom please return to the emergency department.  Please make sure to keep the wounds on your feet clean and dry, please continue to wash them well and keep them covered. Please make your primary care provider aware of your work-up today and findings, including your abnormal foot xray. Recommend rest, ice, compression, and elevation to help with pain. Recommend tylenol  to help with pain as well as your prescribed medications. The max daily dose of tylenol  is 4000mg /day.   Please attempt to arrange outpatient hemodialysis, if unable to do so you can come to the ER for dialysis in the mean time  RESOURCE GUIDE  Chronic Pain Problems: Contact [facility]  [phone] Patients need to be referred by their primary care doctor.  Insufficient Money for Medicine: Contact [employer]:  call [phone]  No Primary Care Doctor: Call [facility]  [phone] - can help you locate a primary care doctor that  accepts your insurance, provides certain services, etc. Physician  Referral Service- [phone]  Agencies that provide inexpensive medical care: [facility]  [phone] [facility]  [phone] [facility]  [phone] [facility]  [phone] [facility]  [phone] [facility]  [phone]  Medicaid-accepting [facility] Providers: [facility]- [address], [address]  [phone], Mon-Fri 9am-7pm, Sat 9am-1pm [facility]- [address] [place], [address] [place]  [phone] [facility]- [address] [place]  [phone] [facility]- [address]  [phone] Kenni[facility] Leech- [address] [place], [address], [phone]  Only accepts [place] patients after they have their name  applied to their card  Self Pay (no insurance) in [facility]: Sickle Cell Patients - [facility]  [address] [place], [phone] [facility]- [address] [place]  [phone]       [place] [facility]- [address]       -     [facility]- see information above (Speak to [employer] if you do not have insurance)       -  [facility]- [address],  [phone]       -  [facility]- [address], [phone]       -  [facility]-  [address], [place], [phone]       -  [facility] - [address] [phone]       -  [facility]- [address] [phone], also [address], [phone]       -     [facility]- [address] [place], [phone], 1st & 3rd Saturday         every month, 10am-1pm  -     [facility]   [address] [place], [place].   Phone:  [phone], Fax:  [phone]. Hours of Operation:  9 am - 6 pm, M-F.  -     [facility]   [address], [place]   Phone: [phone], Fax: [phone].  Hours of Operation:  8:30 am - 5:30 pm, M-F.    Dental Assistance If unable to pay or uninsured, contact:  [facility]. to become qualified for the adult dental clinic.  Patients with Medicaid: [facility] [phone] [address], [phone] [address] [phone]  If unable to pay, or uninsured, contact [facility] [phone] in [place], [phone] in [facility]) to become qualified for the adult dental clinic  [facility] [address] [place], [place] [address] [phone] [email or web address]  Other Proofreader Services: [facility]- [address] [place], [place], [place], [address], [phone], Ext. [phone], 2nd and 4th Thursday of the month at 6:30am.  10 clients each day by appointment, can sometimes see walk-in patients if someone does not show for an appointment. [facility]- [address] [place], [place], [address], [phone] [facility] [address] [place], [place], [address], [phone] [facility] Department- [phone] [facility] Department- [phone] [facility] [phone]

## 2023-11-25 NOTE — Progress Notes (Signed)
   11/25/23 0744  Vitals  Temp 97.6 F (36.4 C)  Temp Source Oral  BP (!) 165/82  MAP (mmHg) 106  Pulse Rate 72  ECG Heart Rate 70  Resp 17  Level of Consciousness  Level of Consciousness Alert  MEWS COLOR  MEWS Score Color Green  Oxygen  Therapy  SpO2 98 %  O2 Device Room Air  Pain Assessment  Pain Scale 0-10  Pain Score 10  PCA/Epidural/Spinal Assessment  Respiratory Pattern Regular;Unlabored  ECG Monitoring  Cardiac Rhythm NSR  MEWS Score  MEWS Temp 0  MEWS Systolic 0  MEWS Pulse 0  MEWS RR 0  MEWS LOC 0  MEWS Score 0   Pt currently refusing dialysis, previously cannulated but needles clotted during initial hook up per report from previous RN-Marilynn. Pt alert and oriented x 4 and no acute distress and will be sent back to ED [address] per charge nurse [facility]. MD-Kruska made aware pt refusing dialysis. Pt says she will come back to dialysis at a later time.

## 2023-11-25 NOTE — ED Notes (Signed)
 Renal breakfast house tray ordered for the patient.

## 2023-11-26 LAB — HEPATITIS B SURFACE ANTIBODY, QUANTITATIVE: Hep B S AB Quant (Post): <3.5 m[IU]/mL — ABNORMAL LOW

## 2023-12-30 ENCOUNTER — Other Ambulatory Visit: Payer: Self-pay

## 2023-12-30 ENCOUNTER — Emergency Department (HOSPITAL_COMMUNITY)

## 2023-12-30 ENCOUNTER — Encounter (HOSPITAL_COMMUNITY): Payer: Self-pay

## 2023-12-30 ENCOUNTER — Inpatient Hospital Stay (HOSPITAL_COMMUNITY)
Admission: EM | Admit: 2023-12-30 | Discharge: 2024-01-17 | DRG: 291 | Disposition: A | Discharge status: Home / Self Care | Attending: Internal Medicine | Admitting: Internal Medicine

## 2023-12-30 DIAGNOSIS — F32A Depression, unspecified: Secondary | ICD-10-CM | POA: Diagnosis present

## 2023-12-30 DIAGNOSIS — K567 Ileus, unspecified: Secondary | ICD-10-CM | POA: Diagnosis not present

## 2023-12-30 DIAGNOSIS — Z8616 Personal history of COVID-19: Secondary | ICD-10-CM

## 2023-12-30 DIAGNOSIS — Z5901 Sheltered homelessness: Secondary | ICD-10-CM

## 2023-12-30 DIAGNOSIS — J810 Acute pulmonary edema: Secondary | ICD-10-CM | POA: Diagnosis present

## 2023-12-30 DIAGNOSIS — Z91158 Patient's noncompliance with renal dialysis for other reason: Secondary | ICD-10-CM

## 2023-12-30 DIAGNOSIS — I5033 Acute on chronic diastolic (congestive) heart failure: Secondary | ICD-10-CM | POA: Diagnosis present

## 2023-12-30 DIAGNOSIS — R64 Cachexia: Secondary | ICD-10-CM | POA: Diagnosis present

## 2023-12-30 DIAGNOSIS — K7460 Unspecified cirrhosis of liver: Secondary | ICD-10-CM | POA: Diagnosis present

## 2023-12-30 DIAGNOSIS — F419 Anxiety disorder, unspecified: Secondary | ICD-10-CM | POA: Diagnosis present

## 2023-12-30 DIAGNOSIS — E8721 Acute metabolic acidosis: Secondary | ICD-10-CM | POA: Diagnosis present

## 2023-12-30 DIAGNOSIS — Z7951 Long term (current) use of inhaled steroids: Secondary | ICD-10-CM

## 2023-12-30 DIAGNOSIS — N3000 Acute cystitis without hematuria: Secondary | ICD-10-CM

## 2023-12-30 DIAGNOSIS — Z720 Tobacco use: Secondary | ICD-10-CM | POA: Diagnosis present

## 2023-12-30 DIAGNOSIS — E872 Acidosis, unspecified: Secondary | ICD-10-CM | POA: Diagnosis present

## 2023-12-30 DIAGNOSIS — N186 End stage renal disease: Secondary | ICD-10-CM | POA: Diagnosis present

## 2023-12-30 DIAGNOSIS — D638 Anemia in other chronic diseases classified elsewhere: Secondary | ICD-10-CM | POA: Diagnosis present

## 2023-12-30 DIAGNOSIS — W458XXA Other foreign body or object entering through skin, initial encounter: Secondary | ICD-10-CM | POA: Diagnosis present

## 2023-12-30 DIAGNOSIS — G894 Chronic pain syndrome: Secondary | ICD-10-CM | POA: Diagnosis present

## 2023-12-30 DIAGNOSIS — I5032 Chronic diastolic (congestive) heart failure: Secondary | ICD-10-CM | POA: Diagnosis present

## 2023-12-30 DIAGNOSIS — E8770 Fluid overload, unspecified: Secondary | ICD-10-CM | POA: Diagnosis present

## 2023-12-30 DIAGNOSIS — S90852A Superficial foreign body, left foot, initial encounter: Secondary | ICD-10-CM | POA: Diagnosis present

## 2023-12-30 DIAGNOSIS — J449 Chronic obstructive pulmonary disease, unspecified: Secondary | ICD-10-CM | POA: Diagnosis present

## 2023-12-30 DIAGNOSIS — Z79899 Other long term (current) drug therapy: Secondary | ICD-10-CM

## 2023-12-30 DIAGNOSIS — Z681 Body mass index (BMI) 19 or less, adult: Secondary | ICD-10-CM | POA: Diagnosis not present

## 2023-12-30 DIAGNOSIS — B1920 Unspecified viral hepatitis C without hepatic coma: Secondary | ICD-10-CM | POA: Diagnosis present

## 2023-12-30 DIAGNOSIS — E1151 Type 2 diabetes mellitus with diabetic peripheral angiopathy without gangrene: Secondary | ICD-10-CM | POA: Diagnosis present

## 2023-12-30 DIAGNOSIS — D631 Anemia in chronic kidney disease: Secondary | ICD-10-CM | POA: Diagnosis present

## 2023-12-30 DIAGNOSIS — I10 Essential (primary) hypertension: Secondary | ICD-10-CM | POA: Diagnosis present

## 2023-12-30 DIAGNOSIS — F17210 Nicotine dependence, cigarettes, uncomplicated: Secondary | ICD-10-CM | POA: Diagnosis present

## 2023-12-30 DIAGNOSIS — K219 Gastro-esophageal reflux disease without esophagitis: Secondary | ICD-10-CM | POA: Diagnosis present

## 2023-12-30 DIAGNOSIS — I132 Hypertensive heart and chronic kidney disease with heart failure and with stage 5 chronic kidney disease, or end stage renal disease: Secondary | ICD-10-CM | POA: Diagnosis present

## 2023-12-30 DIAGNOSIS — E785 Hyperlipidemia, unspecified: Secondary | ICD-10-CM | POA: Diagnosis present

## 2023-12-30 DIAGNOSIS — Z833 Family history of diabetes mellitus: Secondary | ICD-10-CM

## 2023-12-30 DIAGNOSIS — E8779 Other fluid overload: Secondary | ICD-10-CM

## 2023-12-30 DIAGNOSIS — R54 Age-related physical debility: Secondary | ICD-10-CM | POA: Diagnosis present

## 2023-12-30 DIAGNOSIS — Z888 Allergy status to other drugs, medicaments and biological substances status: Secondary | ICD-10-CM

## 2023-12-30 DIAGNOSIS — Z91199 Patient's noncompliance with other medical treatment and regimen due to unspecified reason: Secondary | ICD-10-CM

## 2023-12-30 DIAGNOSIS — F1910 Other psychoactive substance abuse, uncomplicated: Secondary | ICD-10-CM | POA: Diagnosis present

## 2023-12-30 DIAGNOSIS — J9601 Acute respiratory failure with hypoxia: Secondary | ICD-10-CM | POA: Diagnosis present

## 2023-12-30 DIAGNOSIS — Z515 Encounter for palliative care: Secondary | ICD-10-CM

## 2023-12-30 DIAGNOSIS — Z66 Do not resuscitate: Secondary | ICD-10-CM | POA: Diagnosis present

## 2023-12-30 DIAGNOSIS — Z5900 Homelessness unspecified: Secondary | ICD-10-CM

## 2023-12-30 DIAGNOSIS — E1122 Type 2 diabetes mellitus with diabetic chronic kidney disease: Secondary | ICD-10-CM | POA: Diagnosis present

## 2023-12-30 DIAGNOSIS — D509 Iron deficiency anemia, unspecified: Secondary | ICD-10-CM | POA: Diagnosis present

## 2023-12-30 DIAGNOSIS — R188 Other ascites: Secondary | ICD-10-CM | POA: Diagnosis present

## 2023-12-30 DIAGNOSIS — Z5982 Transportation insecurity: Secondary | ICD-10-CM

## 2023-12-30 DIAGNOSIS — Z992 Dependence on renal dialysis: Secondary | ICD-10-CM

## 2023-12-30 LAB — COMPREHENSIVE METABOLIC PANEL WITH GFR
ALT: 10 U/L (ref 0–44)
AST: 13 U/L — ABNORMAL LOW (ref 15–41)
Albumin: 2.6 g/dL — ABNORMAL LOW (ref 3.5–5.0)
Alkaline Phosphatase: 90 U/L (ref 38–126)
Anion gap: 16 — ABNORMAL HIGH (ref 5–15)
BUN: 56 mg/dL — ABNORMAL HIGH (ref 8–23)
CO2: 13 mmol/L — ABNORMAL LOW (ref 22–32)
Calcium: 7.9 mg/dL — ABNORMAL LOW (ref 8.9–10.3)
Chloride: 103 mmol/L (ref 98–111)
Creatinine, Ser: 6.65 mg/dL — ABNORMAL HIGH (ref 0.44–1.00)
GFR, Estimated: 7 mL/min — ABNORMAL LOW (ref >60)
Glucose, Bld: 90 mg/dL (ref 70–99)
Potassium: 3.6 mmol/L (ref 3.5–5.1)
Sodium: 132 mmol/L — ABNORMAL LOW (ref 135–145)
Total Bilirubin: 0.4 mg/dL (ref 0.0–1.2)
Total Protein: 6.2 g/dL — ABNORMAL LOW (ref 6.5–8.1)

## 2023-12-30 LAB — URINALYSIS, W/ REFLEX TO CULTURE (INFECTION SUSPECTED)
Bilirubin Urine: NEGATIVE
Glucose, UA: 50 mg/dL — AB
Ketones, ur: NEGATIVE mg/dL
Nitrite: NEGATIVE
Protein, ur: 100 mg/dL — AB
Specific Gravity, Urine: 1.011 (ref 1.005–1.030)
WBC, UA: >50 WBC/hpf (ref 0–5)
pH: 6 (ref 5.0–8.0)

## 2023-12-30 LAB — I-STAT VENOUS BLOOD GAS, ED
Acid-base deficit: 11 mmol/L — ABNORMAL HIGH (ref 0.0–2.0)
Bicarbonate: 15.5 mmol/L — ABNORMAL LOW (ref 20.0–28.0)
Calcium, Ion: 1.14 mmol/L — ABNORMAL LOW (ref 1.15–1.40)
HCT: 23 % — ABNORMAL LOW (ref 36.0–46.0)
Hemoglobin: 7.8 g/dL — ABNORMAL LOW (ref 12.0–15.0)
O2 Saturation: 99 %
Potassium: 3.7 mmol/L (ref 3.5–5.1)
Sodium: 136 mmol/L (ref 135–145)
TCO2: 17 mmol/L — ABNORMAL LOW (ref 22–32)
pCO2, Ven: 37.3 mmHg — ABNORMAL LOW (ref 44–60)
pH, Ven: 7.226 — ABNORMAL LOW (ref 7.25–7.43)
pO2, Ven: 156 mmHg — ABNORMAL HIGH (ref 32–45)

## 2023-12-30 LAB — I-STAT CHEM 8, ED
BUN: 55 mg/dL — ABNORMAL HIGH (ref 8–23)
Calcium, Ion: 1.15 mmol/L (ref 1.15–1.40)
Chloride: 108 mmol/L (ref 98–111)
Creatinine, Ser: 6.9 mg/dL — ABNORMAL HIGH (ref 0.44–1.00)
Glucose, Bld: 87 mg/dL (ref 70–99)
HCT: 25 % — ABNORMAL LOW (ref 36.0–46.0)
Hemoglobin: 8.5 g/dL — ABNORMAL LOW (ref 12.0–15.0)
Potassium: 3.7 mmol/L (ref 3.5–5.1)
Sodium: 136 mmol/L (ref 135–145)
TCO2: 17 mmol/L — ABNORMAL LOW (ref 22–32)

## 2023-12-30 LAB — CBC
HCT: 23.7 % — ABNORMAL LOW (ref 36.0–46.0)
HCT: 25.4 % — ABNORMAL LOW (ref 36.0–46.0)
Hemoglobin: 7.6 g/dL — ABNORMAL LOW (ref 12.0–15.0)
Hemoglobin: 8.2 g/dL — ABNORMAL LOW (ref 12.0–15.0)
MCH: 30.3 pg (ref 26.0–34.0)
MCH: 30.8 pg (ref 26.0–34.0)
MCHC: 32.1 g/dL (ref 30.0–36.0)
MCHC: 32.3 g/dL (ref 30.0–36.0)
MCV: 94.4 fL (ref 80.0–100.0)
MCV: 95.5 fL (ref 80.0–100.0)
Platelets: 411 K/uL — ABNORMAL HIGH (ref 150–400)
Platelets: 410 K/uL — ABNORMAL HIGH (ref 150–400)
RBC: 2.51 MIL/uL — ABNORMAL LOW (ref 3.87–5.11)
RBC: 2.66 MIL/uL — ABNORMAL LOW (ref 3.87–5.11)
RDW: 17.3 % — ABNORMAL HIGH (ref 11.5–15.5)
RDW: 17.3 % — ABNORMAL HIGH (ref 11.5–15.5)
WBC: 6.5 K/uL (ref 4.0–10.5)
WBC: 5.7 K/uL (ref 4.0–10.5)
nRBC: 0 % (ref 0.0–0.2)
nRBC: 0 % (ref 0.0–0.2)

## 2023-12-30 LAB — RAPID URINE DRUG SCREEN, HOSP PERFORMED
Amphetamines: NOT DETECTED
Barbiturates: NOT DETECTED
Benzodiazepines: NOT DETECTED
Cocaine: POSITIVE — AB
Opiates: NOT DETECTED
Tetrahydrocannabinol: NOT DETECTED

## 2023-12-30 LAB — CREATININE, SERUM
Creatinine, Ser: 6.44 mg/dL — ABNORMAL HIGH (ref 0.44–1.00)
GFR, Estimated: 7 mL/min — ABNORMAL LOW (ref >60)

## 2023-12-30 LAB — PROTIME-INR
INR: 1.1 (ref 0.8–1.2)
Prothrombin Time: 14.8 s (ref 11.4–15.2)

## 2023-12-30 LAB — RESP PANEL BY RT-PCR (RSV, FLU A&B, COVID)  RVPGX2
Influenza A by PCR: NEGATIVE
Influenza B by PCR: NEGATIVE
Resp Syncytial Virus by PCR: NEGATIVE
SARS Coronavirus 2 by RT PCR: NEGATIVE

## 2023-12-30 LAB — I-STAT CG4 LACTIC ACID, ED
Lactic Acid, Venous: 0.6 mmol/L (ref 0.5–1.9)
Lactic Acid, Venous: <0.3 mmol/L — ABNORMAL LOW (ref 0.5–1.9)

## 2023-12-30 LAB — TROPONIN I (HIGH SENSITIVITY)
Troponin I (High Sensitivity): 19 ng/L — ABNORMAL HIGH (ref <18)
Troponin I (High Sensitivity): 19 ng/L — ABNORMAL HIGH (ref <18)

## 2023-12-30 LAB — BRAIN NATRIURETIC PEPTIDE: B Natriuretic Peptide: 1598.1 pg/mL — ABNORMAL HIGH (ref 0.0–100.0)

## 2023-12-30 LAB — CBG MONITORING, ED: Glucose-Capillary: 74 mg/dL (ref 70–99)

## 2023-12-30 LAB — AMMONIA: Ammonia: 21 umol/L (ref 9–35)

## 2023-12-30 LAB — LIPASE, BLOOD: Lipase: 33 U/L (ref 11–51)

## 2023-12-30 LAB — HEPATITIS B SURFACE ANTIGEN: Hepatitis B Surface Ag: NONREACTIVE

## 2023-12-30 MED ORDER — FERROUS SULFATE 325 (65 FE) MG PO TABS
325.0000 mg | ORAL_TABLET | Freq: Every day | ORAL | Status: DC
  Administered 2024-01-01 – 2024-01-17 (×17): 325 mg via ORAL
  Filled 2023-12-30 (×17): qty 1

## 2023-12-30 MED ORDER — ONDANSETRON HCL 4 MG/2ML IJ SOLN
4.0000 mg | Freq: Once | INTRAMUSCULAR | Status: AC
  Administered 2023-12-30: 4 mg via INTRAVENOUS
  Filled 2023-12-30: qty 2

## 2023-12-30 MED ORDER — ACETAMINOPHEN 650 MG RE SUPP: 650.0000 mg | Freq: Four times a day (QID) | RECTAL | Status: DC | PRN

## 2023-12-30 MED ORDER — SUCRALFATE 1 GM/10ML PO SUSP
1.0000 g | Freq: Three times a day (TID) | ORAL | Status: DC
  Administered 2023-12-30 – 2024-01-13 (×45): 1 g via ORAL
  Filled 2023-12-30 (×44): qty 10

## 2023-12-30 MED ORDER — ALBUTEROL SULFATE (2.5 MG/3ML) 0.083% IN NEBU: 2.5000 mg | INHALATION_SOLUTION | RESPIRATORY_TRACT | Status: DC | PRN

## 2023-12-30 MED ORDER — FENTANYL CITRATE (PF) 50 MCG/ML IJ SOSY
50.0000 ug | PREFILLED_SYRINGE | Freq: Once | INTRAMUSCULAR | Status: AC
  Administered 2023-12-30: 50 ug via INTRAVENOUS
  Filled 2023-12-30: qty 1

## 2023-12-30 MED ORDER — ONDANSETRON HCL 4 MG PO TABS: 4.0000 mg | ORAL_TABLET | Freq: Four times a day (QID) | ORAL | Status: DC | PRN

## 2023-12-30 MED ORDER — DOCUSATE SODIUM 100 MG PO CAPS
100.0000 mg | ORAL_CAPSULE | Freq: Two times a day (BID) | ORAL | Status: DC
  Administered 2023-12-30 – 2024-01-16 (×30): 100 mg via ORAL
  Filled 2023-12-30 (×30): qty 1

## 2023-12-30 MED ORDER — SODIUM CHLORIDE 0.9% FLUSH: 3.0000 mL | INTRAVENOUS | Status: DC | PRN

## 2023-12-30 MED ORDER — FUROSEMIDE 10 MG/ML IJ SOLN
40.0000 mg | Freq: Once | INTRAMUSCULAR | Status: AC
  Administered 2023-12-30: 40 mg via INTRAVENOUS
  Filled 2023-12-30: qty 4

## 2023-12-30 MED ORDER — GUAIFENESIN ER 600 MG PO TB12
600.0000 mg | ORAL_TABLET | Freq: Two times a day (BID) | ORAL | Status: DC
  Administered 2023-12-30 – 2024-01-17 (×33): 600 mg via ORAL
  Filled 2023-12-30 (×33): qty 1

## 2023-12-30 MED ORDER — SODIUM CHLORIDE 0.9% FLUSH
3.0000 mL | Freq: Two times a day (BID) | INTRAVENOUS | Status: DC
  Administered 2023-12-31 – 2024-01-17 (×35): 3 mL via INTRAVENOUS

## 2023-12-30 MED ORDER — CALCITRIOL 0.25 MCG PO CAPS
0.2500 ug | ORAL_CAPSULE | Freq: Every day | ORAL | Status: DC
  Administered 2023-12-30 – 2024-01-17 (×17): 0.25 ug via ORAL
  Filled 2023-12-30 (×18): qty 1

## 2023-12-30 MED ORDER — ACETAMINOPHEN 325 MG PO TABS
650.0000 mg | ORAL_TABLET | Freq: Four times a day (QID) | ORAL | Status: DC | PRN
  Administered 2024-01-02 – 2024-01-09 (×7): 650 mg via ORAL
  Filled 2023-12-30 (×8): qty 2

## 2023-12-30 MED ORDER — HEPARIN SODIUM (PORCINE) 5000 UNIT/ML IJ SOLN
5000.0000 [IU] | Freq: Three times a day (TID) | INTRAMUSCULAR | Status: DC
  Administered 2023-12-30 – 2024-01-17 (×46): 5000 [IU] via SUBCUTANEOUS
  Filled 2023-12-30 (×48): qty 1

## 2023-12-30 MED ORDER — IOHEXOL 350 MG/ML SOLN
100.0000 mL | Freq: Once | INTRAVENOUS | Status: AC | PRN
  Administered 2023-12-30: 100 mL via INTRAVENOUS

## 2023-12-30 MED ORDER — ATORVASTATIN CALCIUM 10 MG PO TABS
10.0000 mg | ORAL_TABLET | Freq: Every day | ORAL | Status: DC
  Administered 2023-12-30 – 2024-01-17 (×17): 10 mg via ORAL
  Filled 2023-12-30 (×17): qty 1

## 2023-12-30 MED ORDER — SODIUM CHLORIDE 0.9 % IV SOLN
1.0000 g | INTRAVENOUS | Status: DC
Start: 2023-12-31 — End: 2024-01-07
  Administered 2023-12-31 – 2024-01-06 (×7): 1 g via INTRAVENOUS
  Filled 2023-12-30 (×7): qty 10

## 2023-12-30 MED ORDER — SODIUM BICARBONATE 650 MG PO TABS
1300.0000 mg | ORAL_TABLET | Freq: Two times a day (BID) | ORAL | Status: DC
  Administered 2023-12-30 – 2024-01-17 (×33): 1300 mg via ORAL
  Filled 2023-12-30 (×33): qty 2

## 2023-12-30 MED ORDER — ONDANSETRON HCL 4 MG/2ML IJ SOLN
4.0000 mg | Freq: Four times a day (QID) | INTRAMUSCULAR | Status: DC | PRN
Start: 2023-12-30 — End: 2024-01-17
  Administered 2023-12-30 – 2024-01-07 (×5): 4 mg via INTRAVENOUS
  Filled 2023-12-30 (×4): qty 2

## 2023-12-30 MED ORDER — ALPRAZOLAM 0.25 MG PO TABS
0.2500 mg | ORAL_TABLET | Freq: Every evening | ORAL | Status: DC | PRN
  Administered 2023-12-31 – 2024-01-16 (×14): 0.25 mg via ORAL
  Filled 2023-12-30 (×14): qty 1

## 2023-12-30 MED ORDER — SODIUM CHLORIDE 0.9 % IV SOLN
1.0000 g | Freq: Once | INTRAVENOUS | Status: AC
  Administered 2023-12-30: 1 g via INTRAVENOUS
  Filled 2023-12-30: qty 10

## 2023-12-30 MED ORDER — CARVEDILOL 6.25 MG PO TABS
6.2500 mg | ORAL_TABLET | Freq: Two times a day (BID) | ORAL | Status: DC
  Administered 2023-12-30 – 2024-01-17 (×33): 6.25 mg via ORAL
  Filled 2023-12-30 (×19): qty 1
  Filled 2023-12-30: qty 2
  Filled 2023-12-30 (×13): qty 1

## 2023-12-30 MED ORDER — CHLORHEXIDINE GLUCONATE CLOTH 2 % EX PADS
6.0000 | MEDICATED_PAD | Freq: Every day | CUTANEOUS | Status: DC
  Administered 2024-01-01 – 2024-01-08 (×6): 6 via TOPICAL

## 2023-12-30 MED ORDER — SODIUM CHLORIDE 0.9 % IV SOLN: 250.0000 mL | INTRAVENOUS | Status: AC | PRN

## 2023-12-30 MED ORDER — AMLODIPINE BESYLATE 10 MG PO TABS
10.0000 mg | ORAL_TABLET | Freq: Every day | ORAL | Status: DC
  Administered 2023-12-30 – 2024-01-17 (×16): 10 mg via ORAL
  Filled 2023-12-30 (×9): qty 1
  Filled 2023-12-30: qty 2
  Filled 2023-12-30 (×7): qty 1

## 2023-12-30 NOTE — ED Provider Notes (Signed)
 [place] EMERGENCY DEPARTMENT AT [facility] Provider Note   CSN: [id number] Arrival date & time: 12/30/23  1215     Patient presents with: Shortness of Breath   [name] is a 63 y.o. female with a past history of alcohol  abuse, polysubstance abuse, homelessness, end-stage renal disease with noncompliance of dialysis, and chronic tobacco abuse.  Patient brought in by EMS from a motel.  She presents complaining of severe abdominal and back pain with new abdominal distention over the past several days.  Patient states that she hurts everywhere.  Her worst pain is in her abdomen and back.  EMS report that her oxygen  saturation was waffling between 70 and 80% so they put her on 3 L.  She is denying any significant shortness of breath.  She has not apparently been to dialysis for a month because of not being able to get there.  She reports a history of CHF.    Shortness of Breath      Prior to Admission medications   Medication Sig Start Date End Date Taking? Authorizing Provider  acetaminophen  (TYLENOL ) 325 MG tablet Take 2 tablets (650 mg total) by mouth every 6 (six) hours as needed for mild pain (pain score 1-3). 11/02/23   Dennise Lavada POUR, MD  albuterol  (VENTOLIN  HFA) 108 (90 Base) MCG/ACT inhaler Inhale 2 puffs into the lungs every 6 (six) hours as needed for wheezing or shortness of breath. 11/02/23   Singh, Prashant K, MD  ALPRAZolam  (XANAX ) 0.25 MG tablet Take 1 tablet (0.25 mg total) by mouth at bedtime as needed for anxiety. 11/02/23   Singh, Prashant K, MD  amLODipine  (NORVASC ) 10 MG tablet Take 1 tablet (10 mg total) by mouth daily. 11/02/23   Singh, Prashant K, MD  atorvastatin  (LIPITOR) 10 MG tablet Take 1 tablet (10 mg total) by mouth daily. 11/02/23   Singh, Prashant K, MD  calcitRIOL  (ROCALTROL ) 0.25 MCG capsule Take 1 capsule (0.25 mcg total) by mouth daily. 11/02/23   Singh, Prashant K, MD  carvedilol  (COREG ) 6.25 MG tablet Take 1 tablet (6.25 mg total) by  mouth 2 (two) times daily with a meal. 11/02/23   Dennise Lavada POUR, MD  ferrous sulfate  (FEROSUL) 325 (65 FE) MG tablet Take 1 tablet (325 mg total) by mouth daily with breakfast. 11/02/23   Singh, Prashant K, MD  gabapentin  (NEURONTIN ) 100 MG capsule Take 1 capsule (100 mg total) by mouth 2 (two) times daily for 60 doses. 11/02/23 12/02/23  Dennise Lavada POUR, MD  nicotine  (NICODERM CQ  - DOSED IN MG/24 HOURS) 21 mg/24hr patch Place 1 patch (21 mg total) onto the skin daily. 11/02/23   Singh, Prashant K, MD  oxyCODONE  (ROXICODONE ) 5 MG immediate release tablet Take 1 tablet (5 mg total) by mouth every 4 (four) hours as needed for severe pain (pain score 7-10). 11/25/23   Dreama Longs, MD  pantoprazole  (PROTONIX ) 40 MG tablet Take 1 tablet (40 mg total) by mouth 2 (two) times daily. 11/02/23 12/02/23  Singh, Prashant K, MD  sodium bicarbonate  650 MG tablet Take 2 tablets (1,300 mg total) by mouth 2 (two) times daily. 11/02/23 01/31/24  Dennise Lavada POUR, MD  sucralfate  (CARAFATE ) 1 GM/10ML suspension Take 10 mLs (1 g total) by mouth 4 (four) times daily -  with meals and at bedtime. 11/02/23   Singh, Prashant K, MD  torsemide  (DEMADEX ) 100 MG tablet Take 1 tablet (100 mg total) by mouth daily. 11/25/23 12/25/23  Dreama Longs, MD  DOMINIC  ELLIPTA 100-62.5-25 MCG/ACT AEPB Inhale 1 puff into the lungs daily. 11/02/23   Dennise Lavada POUR, MD    Allergies: Zestril  [lisinopril ]    Review of Systems  Respiratory:  Positive for shortness of breath.     Updated Vital Signs BP (!) 173/88 (BP Location: Left Arm)   Pulse 83   Temp 97.9 F (36.6 C) (Oral)   Resp 17   Ht 5' 7 (1.702 m)   SpO2 100%   BMI 18.02 kg/m   Physical Exam Vitals and nursing note reviewed.  Constitutional:      Appearance: She is well-developed. She is ill-appearing. She is not diaphoretic.     Comments: Very thin limbs, distended abdomen, facial dyskinesia, edentulous, appears older than stated age  HENT:     Head:  Normocephalic and atraumatic.     Right Ear: External ear normal.     Left Ear: External ear normal.     Nose: Nose normal.     Mouth/Throat:     Mouth: Mucous membranes are moist.  Eyes:     General: No scleral icterus.    Conjunctiva/sclera: Conjunctivae normal.  Cardiovascular:     Rate and Rhythm: Regular rhythm.     Pulses:          Dorsalis pedis pulses are 0 on the right side and 0 on the left side.       Posterior tibial pulses are 1+ on the right side and 1+ on the left side.     Heart sounds: Normal heart sounds. No murmur heard.    No friction rub. No gallop.  Pulmonary:     Effort: Pulmonary effort is normal. No tachypnea or respiratory distress.     Breath sounds: Normal breath sounds.  Abdominal:     General: Bowel sounds are normal. There is distension (tense distension).     Palpations: Abdomen is soft. There is no mass.     Tenderness: There is generalized abdominal tenderness. There is guarding.     Hernia: No hernia is present.  Musculoskeletal:     Cervical back: Normal range of motion.  Feet:     Comments: Ankle wounds. Feet are warm to the touch normal cap refill Skin:    General: Skin is warm and dry.  Neurological:     Mental Status: She is alert and oriented to person, place, and time.  Psychiatric:        Behavior: Behavior normal.     (all labs ordered are listed, but only abnormal results are displayed) Labs Reviewed  CULTURE, BLOOD (ROUTINE X 2)  CULTURE, BLOOD (ROUTINE X 2)  RESP PANEL BY RT-PCR (RSV, FLU A&B, COVID)  RVPGX2  LIPASE, BLOOD  COMPREHENSIVE METABOLIC PANEL WITH GFR  CBC  PROTIME-INR  BRAIN NATRIURETIC PEPTIDE  URINALYSIS, W/ REFLEX TO CULTURE (INFECTION SUSPECTED)  AMMONIA  CBG MONITORING, ED  I-STAT CG4 LACTIC ACID, ED  I-STAT CHEM 8, ED  I-STAT VENOUS BLOOD GAS, ED  TROPONIN I (HIGH SENSITIVITY)    EKG: None  Radiology: No results found.   .Critical Care  Performed by: Arloa Chroman, PA-C Authorized by:  Arloa Chroman, PA-C   Critical care provider statement:    Critical care time (minutes):  60   Critical care time was exclusive of:  Separately billable procedures and treating other patients   Critical care was necessary to treat or prevent imminent or life-threatening deterioration of the following conditions:  Respiratory failure   Critical care was time spent personally  by me on the following activities:  Development of treatment plan with patient or surrogate, discussions with consultants, evaluation of patient's response to treatment, examination of patient, ordering and review of laboratory studies, ordering and review of radiographic studies, ordering and performing treatments and interventions, pulse oximetry, re-evaluation of patient's condition, review of old charts, obtaining history from patient or surrogate and interpretation of cardiac output measurements    Medications Ordered in the ED  fentaNYL  (SUBLIMAZE ) injection 50 mcg (has no administration in time range)  ondansetron  (ZOFRAN ) injection 4 mg (has no administration in time range)    Clinical Course as of 01/01/24 2241  Tue Dec 30, 2023  1301 This is a 63 year old female who appears very unwell upon arrival.  She has a markedly distended abdomen, very thin limbs, she appears weathered.  She is complaining of severe pain in her abdomen.  Differential diagnosis includes new onset tense ascites, heart failure causing ascites, spontaneous bacterial peritonitis, abdominal aortic dissection, perforated viscus, bowel obstruction.  Furthermore patient is also currently requiring oxygen  supplementation.  She has a longstanding history of tobacco abuse and she may be short of breath due to COPD, pulmonary edema, difficulty moving her diaphragm secondary to tense abdominal distention, ACS.  Less likely something like pneumothorax or pneumonia. Ordered an extensive workup including basic labs a Chem-8 CT angiogram dissection study, blood  cultures lactic acid liver labs including ammonia and INR and urine.  She will be on cardiac monitoring. [AH]  1353 Creatinine(!): 6.90 [AH]  1353 Potassium: 3.7 [AH]  1354 pH, Ven(!): 7.226 [AH]  1354 I-Stat venous blood gas, ED ([facility])(!) VBG - patient appears to have an uncompensated metabolic acidosis  [AH]  1447 Hemoglobin(!): 7.6 HGB down by 2 g [AH]  1457 B Natriuretic Peptide(!): 1,598.1 [AH]  1457 Brain natriuretic peptide(!) [AH]  1611 Rapid urine drug screen (hospital performed)(!) UDS is positive for cocaine [AH]  1613 Urinalysis, w/ Reflex to Culture (Infection Suspected) -Urine, Clean Catch(!) UA appears to be infected [AH]  1613 I reassessed the patient at bedside.  She appears to be far more comfortable.  She is not guarding significantly in her abdomen at this time. [AH]  1615 BUN(!): 56 [AH]  1615 Creatinine(!): 6.65 [AH]  1615 AST(!): 13 [AH]  1615 Albumin (!): 2.6 [AH]  1615 ALT: 10 [AH]  1615 Total Bilirubin: 0.4 His AST ALT and albumin  and bilirubin not markedly elevated.  She may have some hepatic cirrhosis but definitely has large volume ascites noted on her CT imaging.  Suspect that this is also possibly due to her history of heart failure and need for dialysis. [AH]  1616 CT Angio Chest/Abd/Pel for Dissection W and/or Wo Contrast I visualized and dependently interpreted CT angiogram of the abdomen and pelvis for dissection study.  She appears to have large volume ascites.  Multiple areas of mesenteric atherosclerosis noted, no evidence of dissection or aortic aneurysm.  She also has pulmonary edema. [AH]    Clinical Course User Index [AH] Arloa Chroman, PA-C                                 Medical Decision Making 63 year old female with abdominal distention pain and shortness of breath.  Feel that her pain is due to the abdominal distention and not due to mesenteric ischemia or ischemic gut as she has no elevated white blood cell count or lactic acid.   Patient likely needs dialysis for  her volume overload.  She will be admitted to the hospital.  She is otherwise stable without respiratory distress and is compensated on oxygen  at this time  Amount and/or Complexity of Data Reviewed Labs: ordered. Decision-making details documented in ED Course. Radiology: ordered. Decision-making details documented in ED Course. Discussion of management or test interpretation with external provider(s): Case discussed with Dr. Geralynn who will admit the patient. Case discussed with Dr. 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 Case discussed with Dr. Leotis who will admit the patient.  Risk Prescription drug management. Decision regarding hospitalization.        Final diagnoses:  Acute pulmonary edema (HCC)  Acute respiratory failure with hypoxia (HCC)  Metabolic acidosis  Other ascites  Acute cystitis without hematuria    ED Discharge Orders     None          Arloa Chroman, PA-C 01/01/24 2245    Kammerer, Megan L, DO 01/04/24 9772

## 2023-12-30 NOTE — H&P (Signed)
 History and Physical    [name] FMW:[id number] DOB: November 15, 1960 DOA: 12/30/2023  PCP: Pcp, No  Patient coming from: Home   I have personally briefly reviewed patient's old medical records in [facility]  Chief Complaint: Abdominal distention associated with shortness of breath and not feeling well.  HPI: [name] is  63 y.o. female with PMH significant for ESRD on hemodialysis, Noncompliant with dialysis, homeless, COPD, Chronic diastolic CHF, Chronic pain syndrome, history of polysubstance use including cocaine, depression, GERD, alcohol  use, chronic tobacco use presented in the ED with abdominal distention and shortness of breath.  Patient was brought from the motel by EMS, she was hypoxic with SpO2 of 70 to 80% on room air, was placed on 3 L of supplemental oxygen .  At baseline she does not use oxygen . Of note patient was recently admitted at [facility] from 10/22/2023 to 11/02/2023 for missed hemodialysis.  Patient is homeless and was discharged to a motel.  She has been actively smoking,   ED Course: She was hypertensive and hypoxic other vitals were stable. Temp 97.7, HR 83, RR 17, BP 173/88, SpO2 100% on 3 L. Labs include sodium 136, potassium 3.7, chloride 108, bicarb 17, glucose 87, BUN 55, creatinine 6.90, troponin 19, lactic acid 0.6, hemoglobin 8.5, hematocrit 25.0, chest x-ray  showed  Mild cardiomegaly with small right pleural effusion.   Review of Systems:    Review of Systems  Constitutional: Negative.   HENT: Negative.    Respiratory:  Positive for shortness of breath and wheezing.   Cardiovascular: Negative.   Gastrointestinal: Negative.   Genitourinary: Negative.   Musculoskeletal: Negative.   Skin: Negative.   Neurological: Negative.   Endo/Heme/Allergies: Negative.   Psychiatric/Behavioral:  Positive for depression.     Past Medical History:  Diagnosis Date   Acute on chronic blood loss anemia 08/01/2022   Acute on chronic diastolic CHF  (congestive heart failure) (HCC) 08/12/2022   Acute on chronic respiratory failure with hypoxia (HCC) 05/16/2023   Acute pulmonary edema (HCC) 05/16/2023   Acute seasonal allergic rhinitis due to pollen 02/22/2016   Alcohol  abuse    Allergy    Anxiety    Arteriovenous fistula infection, initial encounter 04/16/2023   Arthritis    Asthma    Cannabis abuse    Cocaine abuse (HCC)    COPD (chronic obstructive pulmonary disease) (HCC)    COPD with acute exacerbation (HCC) 10/25/2022   COVID 05/16/2023   Depression    Drug addiction (HCC)    GERD (gastroesophageal reflux disease)    GIB (gastrointestinal bleeding) 08/19/2019   Heart murmur    Heme positive stool 08/14/2022   Homelessness    Hx of adenomatous colonic polyps    Hyperlipidemia    Hypertension    pt stated every once in a while BP will be high but has not been prescribed medication for HTN.    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) 02/22/2016   SIRS (systemic inflammatory response syndrome) (HCC) 10/25/2022    Past Surgical History:  Procedure Laterality Date   A/V FISTULAGRAM Right 01/31/2023   Procedure: A/V Fistulagram;  Surgeon: Gretta Lonni PARAS, MD;  Location: [facility] INV[facility]IVE CV LAB;  Service:  Cardiovascular;  Laterality: Right;   AV FISTULA PLACEMENT Right 08/14/2022   Procedure: RIGHT ARM BRACHIOBASILIC ATERIOVENOUS FISTULA CREATION;  Surgeon: Magda Debby SAILOR, MD;  Location: [facility] OR;  Service: Vascular;  Laterality: Right;   BASCILIC VEIN TRANSPOSITION Right 11/13/2022   Procedure: RIGHT ARM SECOND STAGE BASILIC VEIN TRANSPOSITION;  Surgeon: Magda Debby SAILOR, MD;  Location: [facility] OR;  Service: Vascular;  Laterality: Right;   BIOPSY  01/01/2019    Procedure: BIOPSY;  Surgeon: Albertus Gordy HERO, MD;  Location: [facility] ENDOSCOPY;  Service: Endoscopy;;   BIOPSY  11/17/2021   Procedure: BIOPSY;  Surgeon: Charlanne Groom, MD;  Location: [facility] ENDOSCOPY;  Service: Gastroenterology;;   CESAREAN SECTION  1989   COLONOSCOPY  11/07/2020   2018   CYSTOSCOPY W/ URETERAL STENT PLACEMENT Left 08/01/2018   Procedure: CYSTOSCOPY WITH RETROGRADE PYELOGRAM/URETERAL STENT PLACEMENT;  Surgeon: Carolee Sherwood JONETTA DOUGLAS, MD;  Location: [facility] ORS;  Service: Urology;  Laterality: Left;   CYSTOSCOPY WITH RETROGRADE PYELOGRAM, URETEROSCOPY AND STENT PLACEMENT Left 01/15/2019   Procedure: CYSTOSCOPY WITH RETROGRADE PYELOGRAM, URETEROSCOPY AND STENT PLACEMENT;  Surgeon: Carolee Sherwood JONETTA DOUGLAS, MD;  Location: [facility] ORS;  Service: Urology;  Laterality: Left;   ENTEROSCOPY N/A 08/16/2022   Procedure: ENTEROSCOPY;  Surgeon: Wilhelmenia Aloha Raddle., MD;  Location: [facility] ENDOSCOPY;  Service: Gastroenterology;  Laterality: N/A;   ENTEROSCOPY N/A 10/31/2022   Procedure: ENTEROSCOPY;  Surgeon: Albertus Gordy HERO, MD;  Location: [facility] ENDOSCOPY;  Service: Gastroenterology;  Laterality: N/A;   ENTEROSCOPY N/A 05/09/2023   Procedure: ENTEROSCOPY;  Surgeon: Federico Rosario BROCKS, MD;  Location: [facility] ENDOSCOPY;  Service: Gastroenterology;  Laterality: N/A;   ENTEROSCOPY N/A 09/09/2023   Procedure: ENTEROSCOPY;  Surgeon: Federico Rosario BROCKS, MD;  Location: [facility] ENDOSCOPY;  Service: Gastroenterology;  Laterality: N/A;   ENTEROSCOPY N/A 10/26/2023   Procedure: ENTEROSCOPY;  Surgeon: Albertus Gordy HERO, MD;  Location: [facility] ENDOSCOPY;  Service: Gastroenterology;  Laterality: N/A;   ESOPHAGOGASTRODUODENOSCOPY (EGD) WITH PROPOFOL  N/A 01/01/2019   Procedure: ESOPHAGOGASTRODUODENOSCOPY (EGD) WITH PROPOFOL ;  Surgeon: Albertus Gordy HERO, MD;  Location: [facility] ENDOSCOPY;  Service: Endoscopy;  Laterality: N/A;   ESOPHAGOGASTRODUODENOSCOPY (EGD) WITH PROPOFOL  N/A 08/21/2019   Procedure: ESOPHAGOGASTRODUODENOSCOPY (EGD) WITH PROPOFOL ;  Surgeon: Shila Gustav GAILS, MD;   Location: [facility] ENDOSCOPY;  Service: Endoscopy;  Laterality: N/A;   ESOPHAGOGASTRODUODENOSCOPY (EGD) WITH PROPOFOL  N/A 11/17/2021   Procedure: ESOPHAGOGASTRODUODENOSCOPY (EGD) WITH PROPOFOL ;  Surgeon: Charlanne Groom, MD;  Location: [facility] ENDOSCOPY;  Service: Gastroenterology;  Laterality: N/A;   FRACTURE SURGERY Left 2011   arm   HEMOSTASIS CLIP PLACEMENT  08/16/2022   Procedure: HEMOSTASIS CLIP PLACEMENT;  Surgeon: Wilhelmenia Aloha Raddle., MD;  Location: [facility] ENDOSCOPY;  Service: Gastroenterology;;   HOT HEMOSTASIS N/A 08/16/2022   Procedure: HOT HEMOSTASIS (ARGON PLASMA COAGULATION/BICAP);  Surgeon: Wilhelmenia Aloha Raddle., MD;  Location: [facility] ENDOSCOPY;  Service: Gastroenterology;  Laterality: N/A;   HOT HEMOSTASIS N/A 10/31/2022   Procedure: HOT HEMOSTASIS (ARGON PLASMA COAGULATION/BICAP);  Surgeon: Albertus Gordy HERO, MD;  Location: [facility] ENDOSCOPY;  Service: Gastroenterology;  Laterality: N/A;   HOT HEMOSTASIS N/A 05/09/2023   Procedure: HOT HEMOSTASIS (ARGON PLASMA COAGULATION/BICAP);  Surgeon: Federico Rosario BROCKS, MD;  Location: [facility] ENDOSCOPY;  Service: Gastroenterology;  Laterality: N/A;   HOT HEMOSTASIS N/A 09/09/2023   Procedure: EGD, WITH ARGON PLASMA COAGULATION;  Surgeon: Federico Rosario BROCKS, MD;  Location: [facility] ENDOSCOPY;  Service: Gastroenterology;  Laterality: N/A;   INSERTION OF DIALYSIS CATHETER Right 08/14/2022   Procedure: INSERTION OF TUNNELED DIALYSIS CATHETER USING PALINDROME CATHETER KIT 19CM;  Surgeon: Magda Debby  N, MD;  Location: [facility] OR;  Service: Vascular;  Laterality: Right;   IR THORACENTESIS ASP PLEURAL SPACE W/IMG GUIDE  09/15/2023   POLYPECTOMY  08/16/2022   Procedure: POLYPECTOMY;  Surgeon: Mansouraty, Aloha Raddle., MD;  Location: [facility] ENDOSCOPY;  Service: Gastroenterology;;   SUBMUCOSAL TATTOO INJECTION  08/16/2022   Procedure: SUBMUCOSAL TATTOO INJECTION;  Surgeon: Wilhelmenia Aloha Raddle., MD;  Location: [facility] ENDOSCOPY;  Service: Gastroenterology;;   UPPER GASTROINTESTINAL ENDOSCOPY       reports  that she has been smoking cigarettes. She has been exposed to tobacco smoke. She has never used smokeless tobacco. She reports that she does not currently use alcohol . She reports current drug use. Drug: Marijuana.  Allergies  Allergen Reactions   Zestril  [Lisinopril ] Other (See Comments)    Hyperkalemia    Family History  Problem Relation Age of Onset   Diabetes Father    Colon cancer Neg Hx    Esophageal cancer Neg Hx    Rectal cancer Neg Hx    Stomach cancer Neg Hx    Family history reviewed and not pertinent.  Prior to Admission medications   Medication Sig Start Date End Date Taking? Authorizing Provider  acetaminophen  (TYLENOL ) 325 MG tablet Take 2 tablets (650 mg total) by mouth every 6 (six) hours as needed for mild pain (pain score 1-3). 11/02/23   Singh, Prashant K, MD  albuterol  (VENTOLIN  HFA) 108 [phone] Base) MCG/ACT inhaler Inhale 2 puffs into the lungs every 6 (six) hours as needed for wheezing or shortness of breath. 11/02/23   Singh, Prashant K, MD  ALPRAZolam  (XANAX ) 0.25 MG tablet Take 1 tablet (0.25 mg total) by mouth at bedtime as needed for anxiety. 11/02/23   Singh, Prashant K, MD  amLODipine  (NORVASC ) 10 MG tablet Take 1 tablet (10 mg total) by mouth daily. 11/02/23   Singh, Prashant K, MD  atorvastatin  (LIPITOR) 10 MG tablet Take 1 tablet (10 mg total) by mouth daily. 11/02/23   Singh, Prashant K, MD  calcitRIOL  (ROCALTROL ) 0.25 MCG capsule Take 1 capsule (0.25 mcg total) by mouth daily. 11/02/23   Singh, Prashant K, MD  carvedilol  (COREG ) 6.25 MG tablet Take 1 tablet (6.25 mg total) by mouth 2 (two) times daily with a meal. 11/02/23   Dennise Lavada POUR, MD  ferrous sulfate  (FEROSUL) 325 (65 FE) MG tablet Take 1 tablet (325 mg total) by mouth daily with breakfast. 11/02/23   Singh, Prashant K, MD  gabapentin  (NEURONTIN ) 100 MG capsule Take 1 capsule (100 mg total) by mouth 2 (two) times daily for 60 doses. 11/02/23 12/02/23  Dennise Lavada POUR, MD  nicotine  (NICODERM CQ  - DOSED  IN MG/24 HOURS) 21 mg/24hr patch Place 1 patch (21 mg total) onto the skin daily. 11/02/23   Singh, Prashant K, MD  oxyCODONE  (ROXICODONE ) 5 MG immediate release tablet Take 1 tablet (5 mg total) by mouth every 4 (four) hours as needed for severe pain (pain score 7-10). 11/25/23   Dreama Longs, MD  pantoprazole  (PROTONIX ) 40 MG tablet Take 1 tablet (40 mg total) by mouth 2 (two) times daily. 11/02/23 12/02/23  Singh, Prashant K, MD  sodium bicarbonate  650 MG tablet Take 2 tablets (1,300 mg total) by mouth 2 (two) times daily. 11/02/23 01/31/24  Dennise Lavada POUR, MD  sucralfate  (CARAFATE ) 1 GM/10ML suspension Take 10 mLs (1 g total) by mouth 4 (four) times daily -  with meals and at bedtime. 11/02/23   Singh, Prashant K, MD  torsemide  (DEMADEX ) 100 MG tablet Take 1  tablet (100 mg total) by mouth daily. 11/25/23 12/25/23  Dreama Longs, MD  TRELEGY ELLIPTA  100-62.5-25 MCG/ACT AEPB Inhale 1 puff into the lungs daily. 11/02/23   Dennise Lavada POUR, MD    Physical Exam: Vitals:   12/30/23 1430 12/30/23 1559 12/30/23 1600 12/30/23 1630  BP: (!) 188/85 (!) 149/67 (!) 149/67 (!) 188/81  Pulse: 81 77 77 82  Resp: (!) 26 16 12 14   Temp:  98.3 F (36.8 C)    TempSrc:  Oral    SpO2: 100% 100% 100% 100%  Height:        Constitutional: Appears calm, comfortable, severely deconditioned, Vitals:   12/30/23 1430 12/30/23 1559 12/30/23 1600 12/30/23 1630  BP: (!) 188/85 (!) 149/67 (!) 149/67 (!) 188/81  Pulse: 81 77 77 82  Resp: (!) 26 16 12 14   Temp:  98.3 F (36.8 C)    TempSrc:  Oral    SpO2: 100% 100% 100% 100%  Height:       Eyes: PERRL, lids and conjunctivae normal ENMT: Mucous membranes are moist. Posterior pharynx clear of any exudate or lesions Neck: normal, supple, no masses, no thyromegaly Respiratory: Decreased breath sounds bilaterally, no wheezing, no crackles. Normal respiratory effort. No accessory muscle use.  Cardiovascular: S1+S2 heard  Regular rate and rhythm, no murmurs / rubs /  gallops.  Abdomen:Soft ,   tenderness ++ , no masses palpated. No hepatosplenomegaly. Bowel sounds positive.  Musculoskeletal: no clubbing / cyanosis. No joint deformity upper and lower extremities.  Skin: no rashes, lesions, ulcers. No induration Neurologic: CN 2-12 grossly intact. Sensation intact, DTR normal. Strength 5/5 in all 4.  Psychiatric: Normal judgment and insight. Alert and oriented x 3. Normal mood.   Labs on Admission: I have personally reviewed following labs and imaging studies  CBC: Recent Labs  Lab 12/30/23 1222 12/30/23 1348  WBC 6.5  --   HGB 7.6* 8.5*  7.8*  HCT 23.7* 25.0*  23.0*  MCV 94.4  --   PLT 411*  --    Basic Metabolic Panel: Recent Labs  Lab 12/30/23 1222 12/30/23 1348  NA 132* 136  136  K 3.6 3.7  3.7  CL 103 108  CO2 13*  --   GLUCOSE 90 87  BUN 56* 55*  CREATININE 6.65* 6.90*  CALCIUM  7.9*  --    GFR: CrCl cannot be calculated (Unknown ideal weight.). Liver Function Tests: Recent Labs  Lab 12/30/23 1222  AST 13*  ALT 10  ALKPHOS 90  BILITOT 0.4  PROT 6.2*  ALBUMIN  2.6*   Recent Labs  Lab 12/30/23 1222  LIPASE 33   Recent Labs  Lab 12/30/23 1226  AMMONIA 21   Coagulation Profile: Recent Labs  Lab 12/30/23 1222  INR 1.1   Cardiac Enzymes: No results for input(s): CKTOTAL, CKMB, CKMBINDEX, TROPONINI in the last 168 hours. BNP (last 3 results) No results for input(s): PROBNP in the last 8760 hours. HbA1C: No results for input(s): HGBA1C in the last 72 hours. CBG: Recent Labs  Lab 12/30/23 1225  GLUCAP 74   Lipid Profile: No results for input(s): CHOL, HDL, LDLCALC, TRIG, CHOLHDL, LDLDIRECT in the last 72 hours. Thyroid Function Tests: No results for input(s): TSH, T4TOTAL, FREET4, T3FREE, THYROIDAB in the last 72 hours. Anemia Panel: No results for input(s): VITAMINB12, FOLATE, FERRITIN, TIBC, IRON , RETICCTPCT in the last 72 hours. Urine analysis:     Component Value Date/Time   COLORURINE YELLOW 12/30/2023 1305   APPEARANCEUR CLOUDY (A) 12/30/2023 1305  LABSPEC 1.011 12/30/2023 1305   PHURINE 6.0 12/30/2023 1305   GLUCOSEU 50 (A) 12/30/2023 1305   HGBUR SMALL (A) 12/30/2023 1305   BILIRUBINUR NEGATIVE 12/30/2023 1305   KETONESUR NEGATIVE 12/30/2023 1305   PROTEINUR 100 (A) 12/30/2023 1305   UROBILINOGEN 0.2 03/02/2010 2027   NITRITE NEGATIVE 12/30/2023 1305   LEUKOCYTESUR LARGE (A) 12/30/2023 1305    Radiological Exams on Admission: CT Angio Chest/Abd/Pel for Dissection W and/or Wo Contrast Result Date: 12/30/2023 EXAM: CT CHEST, ABDOMEN AND PELVIS WITH AND WITHOUT CONTRAST 12/30/2023 03:51:10 PM TECHNIQUE: CT of the chest, abdomen and pelvis was performed with and without the administration of 100 mL of iohexol  (OMNIPAQUE ) 350 MG/ML injection. Multiplanar reformatted images are provided for review. Automated exposure control, iterative reconstruction, and/or weight based adjustment of the mA/kV was utilized to reduce the radiation dose to as low as reasonably achievable. COMPARISON: 10/23/2023 CLINICAL HISTORY: Aortic aneurysm suspected. Shortness of Breath; CT Angio Chest/Abd/Pel for Dissection W and/or Wo Contrast; Aortic aneurysm suspected. Omni 350 ; Pt bib [facility] patient [facility]. Pt has missed dialysis for about a month and states she has been unable to go due to not having the resources. Pt abdomen distended and pt c/o shortness of breath and lower back pain. FINDINGS: CHEST: MEDIASTINUM AND LYMPH NODES: Mild cardiomegaly. Coronary artery calcifications are noted. The central airways are clear. No mediastinal, hilar or axillary lymphadenopathy. LUNGS AND PLEURA: No focal consolidation or pulmonary edema. Small left pleural effusion is noted. Minimal right pleural effusion. Minimal bibasilar subsegmental atelectasis. Minimal emphysematous disease. No pneumothorax. ABDOMEN AND PELVIS: LIVER: Probable hepatic  cirrhosis. GALLBLADDER AND BILE DUCTS: Gallbladder is unremarkable. No biliary ductal dilatation. SPLEEN: No acute abnormality. PANCREAS: No acute abnormality. ADRENAL GLANDS: No acute abnormality. KIDNEYS, URETERS AND BLADDER: Severe stenosis involving the proximal left renal artery. The right renal artery is not well opacified. No stones in the kidneys or ureters. No hydronephrosis. No perinephric or periureteral stranding. Urinary bladder is unremarkable. GI AND BOWEL: Stomach demonstrates no acute abnormality. Severe narrowing at the origin of the inferior mesenteric artery. There is no bowel obstruction. REPRODUCTIVE ORGANS: No acute abnormality. PERITONEUM AND RETROPERITONEUM: Moderate ascites. No free air. VASCULATURE: Aortic atherosclerosis. Occlusion of proximal portions of both superficial femoral arteries. Moderate stenosis of the origin of the right common iliac artery secondary to calcified plaque. Severe stenosis involving the proximal left renal artery. The right renal artery is not well opacified. Severe narrowing at the origin of the inferior mesenteric artery. ABDOMINAL AND PELVIS LYMPH NODES: No lymphadenopathy. REPRODUCTIVE ORGANS: No acute abnormality. BONES AND SOFT TISSUES: No acute osseous abnormality. No focal soft tissue abnormality. IMPRESSION: 1. No evidence of aortic dissection or aneurysm. 2. Severe stenosis of the proximal left renal artery. Right renal artery not well opacified. 3. Severe narrowing at the origin of the inferior mesenteric artery. 4. Moderate stenosis at the origin of the right common iliac artery due to calcified plaque. 5. Occlusion of the proximal portions of both superficial femoral arteries. 6. Probable hepatic cirrhosis with moderate ascites. Electronically signed by: Lynwood Seip MD 12/30/2023 04:09 PM EDT RP Workstation: [id number]   DG Chest Port 1 View Result Date: 12/30/2023 CLINICAL DATA:  Questionable sepsis - evaluate for abnormality EXAM: PORTABLE  CHEST - 1 VIEW COMPARISON:  11/24/2023 FINDINGS: Biapical pleural thickening. Small right pleural effusion with right basilar atelectasis. No new airspace consolidation or pneumothorax. Mild cardiomegaly. Tortuous aorta with aortic atherosclerosis. No acute fracture or destructive lesions. Multilevel thoracic osteophytosis.  IMPRESSION: Mild cardiomegaly with small right pleural effusion. Electronically Signed   By: Rogelia Myers M.D.   On: 12/30/2023 14:28    EKG: Follow up EKG.  Assessment/Plan Active Problems:   Noncompliance with dialysis   Fluid overload   Chronic diastolic CHF (congestive heart failure) (HCC)   ESRD on dialysis (HCC)   Anemia of chronic disease   Essential hypertension   COPD (chronic obstructive pulmonary disease) (HCC)   Anxiety and depression   Hyperlipidemia   Tobacco abuse   Polysubstance abuse (HCC)   Homelessness   IDA (iron  deficiency anemia)   Chronic pain syndrome  Acute hypoxic respiratory failure : Fluid overload due to missed hemodialysis: ESRD on hemodialysis, noncompliance: Acute on chronic diastolic heart failure: Patient presented with worsening shortness of breath, abdominal distention, hypoxia requiring supplemental oxygen . BNP 1598 , Chest x-ray shows cardiomegaly with small pleural effusion. Patient has been homeless, Non compliant, last hemodialysis 4 weeks back. Patient received Lasix  20 mg IV once in the ED.  She felt slightly better. Nephrology Dr. Geralynn was notified.  She does not need emergent dialysis today. She is scheduled for hemodialysis tomorrow morning. Monitor daily weight, intake output charting. Echo 8/25 shows LVEF 60 to 65%,. Grade1 diastolic dysfunction. Continue supplemental oxygen  and wean as tolerated.  Essential hypertension: Continue amlodipine , carvedilol .  Hyperlipidemia Continue Lipitor  Anxiety/ depression: Continue alprazolam .  Chronic pain syndrome: Continue gabapentin .  COPD: At baseline  patient is not on oxygen . Continue home inhalers, Continue supplemental oxygen  wean as tolerated  Elevated troponins: Likely in the setting of missed hemodialysis. Patient denies any chest pain.  Follow-up EKG  Urinary tract infection: UA consistent with UTI. Continue ceftriaxone  1 g daily Follow-up urine cultures.  Anemia of chronic disease: Continue iron  supplementation.   There is no obvious visible bleeding  History of polysubstance use: Patient denies any recent use. Needs social worker consult.  Homelessness: Need social worker consult.  Peripheral arterial disease: CTA : Shows severe vascular disease. Discussed with vascular surgeon Dr. Gari about the patient. He looked at old images and states that there is not significant change in her arterial stenosis. She can follow-up in clinic if needed     DVT prophylaxis: Heparin  sq Code Status: DNR/DNI Family Communication: No family at bed side. Disposition Plan:    Status is: Inpatient Remains inpatient appropriate because: Severity of illness   Consults called: Nephrology Vascular Admission status: Inpatient   Darcel Dawley MD [facility]   If 7PM-7AM, please contact night-coverage   12/30/2023, 5:47 PM

## 2023-12-30 NOTE — ED Notes (Signed)
 CCMD contacted to put patient on cardiac monitoring.

## 2023-12-30 NOTE — Progress Notes (Signed)
 Asked to see this patient per ED team non-urgently for dialysis while here. She is on [place] O2, but not in distress. Will write for HD orders 1st shift in the morning tomorrow. CXR examined and doesn't really show edema or vasc congestion. Nevertheless will plan for max UF w/ HD tomorrow. Full consult will be done tomorrow.   Myer Fret  MD  [facility] 12/30/2023, 5:18 PM  Recent Labs  Lab 12/30/23 1222 12/30/23 1348  HGB 7.6* 8.5*  7.8*  ALBUMIN  2.6*  --   CALCIUM  7.9*  --   CREATININE 6.65* 6.90*  K 3.6 3.7  3.7

## 2023-12-30 NOTE — ED Notes (Signed)
 PO meds held due to pt actively vomiting.

## 2023-12-30 NOTE — ED Notes (Signed)
 Unsuccessful attempts at blood draw. Phlebotomy to see patient. EDP aware of delay.

## 2023-12-30 NOTE — ED Notes (Signed)
 Phlebotomist to see patient for second set of blood cultures still.

## 2023-12-30 NOTE — ED Notes (Signed)
 Phlebotomist asked to see patient for second set of blood cultures due to difficulty obtaining previous set.

## 2023-12-30 NOTE — ED Triage Notes (Signed)
 Pt bib [facility]. Pt has missed dialysis for about a month and states she has been unable to go due to not having the resources. Pt abdomen distended and pt c/o shortness of breath and lower back pain. Pt arrives on [place]L [place] placed by EMS. GCS 15.  EMS VS: 150/90 89 HR 97% 3L [place] 24 RR

## 2023-12-31 DIAGNOSIS — Z992 Dependence on renal dialysis: Secondary | ICD-10-CM

## 2023-12-31 DIAGNOSIS — G894 Chronic pain syndrome: Secondary | ICD-10-CM

## 2023-12-31 DIAGNOSIS — I5032 Chronic diastolic (congestive) heart failure: Secondary | ICD-10-CM

## 2023-12-31 DIAGNOSIS — D638 Anemia in other chronic diseases classified elsewhere: Secondary | ICD-10-CM

## 2023-12-31 DIAGNOSIS — N186 End stage renal disease: Secondary | ICD-10-CM

## 2023-12-31 DIAGNOSIS — Z5900 Homelessness unspecified: Secondary | ICD-10-CM

## 2023-12-31 DIAGNOSIS — E8770 Fluid overload, unspecified: Secondary | ICD-10-CM

## 2023-12-31 DIAGNOSIS — Z91199 Patient's noncompliance with other medical treatment and regimen due to unspecified reason: Secondary | ICD-10-CM

## 2023-12-31 LAB — COMPREHENSIVE METABOLIC PANEL WITH GFR
ALT: 7 U/L (ref 0–44)
AST: 15 U/L (ref 15–41)
Albumin: 2.3 g/dL — ABNORMAL LOW (ref 3.5–5.0)
Alkaline Phosphatase: 77 U/L (ref 38–126)
Anion gap: 13 (ref 5–15)
BUN: 56 mg/dL — ABNORMAL HIGH (ref 8–23)
CO2: 14 mmol/L — ABNORMAL LOW (ref 22–32)
Calcium: 7.6 mg/dL — ABNORMAL LOW (ref 8.9–10.3)
Chloride: 104 mmol/L (ref 98–111)
Creatinine, Ser: 6.37 mg/dL — ABNORMAL HIGH (ref 0.44–1.00)
GFR, Estimated: 7 mL/min — ABNORMAL LOW (ref >60)
Glucose, Bld: 105 mg/dL — ABNORMAL HIGH (ref 70–99)
Potassium: 4.2 mmol/L (ref 3.5–5.1)
Sodium: 131 mmol/L — ABNORMAL LOW (ref 135–145)
Total Bilirubin: 0.4 mg/dL (ref 0.0–1.2)
Total Protein: 5.5 g/dL — ABNORMAL LOW (ref 6.5–8.1)

## 2023-12-31 LAB — CBC
HCT: 20.9 % — ABNORMAL LOW (ref 36.0–46.0)
Hemoglobin: 6.8 g/dL — CL (ref 12.0–15.0)
MCH: 31.1 pg (ref 26.0–34.0)
MCHC: 32.5 g/dL (ref 30.0–36.0)
MCV: 95.4 fL (ref 80.0–100.0)
Platelets: 347 K/uL (ref 150–400)
RBC: 2.19 MIL/uL — ABNORMAL LOW (ref 3.87–5.11)
RDW: 17.2 % — ABNORMAL HIGH (ref 11.5–15.5)
WBC: 6.6 K/uL (ref 4.0–10.5)
nRBC: 0 % (ref 0.0–0.2)

## 2023-12-31 LAB — HEMOGLOBIN AND HEMATOCRIT, BLOOD
HCT: 20.8 % — ABNORMAL LOW (ref 36.0–46.0)
Hemoglobin: 6.7 g/dL — CL (ref 12.0–15.0)

## 2023-12-31 LAB — FERRITIN: Ferritin: 256 ng/mL (ref 11–307)

## 2023-12-31 LAB — URINE CULTURE: Culture: >=100000 — AB

## 2023-12-31 LAB — IRON AND TIBC
Iron: 55 ug/dL (ref 28–170)
Saturation Ratios: 30 % (ref 10.4–31.8)
TIBC: 186 ug/dL — ABNORMAL LOW (ref 250–450)
UIBC: 131 ug/dL

## 2023-12-31 LAB — PHOSPHORUS: Phosphorus: 7.1 mg/dL — ABNORMAL HIGH (ref 2.5–4.6)

## 2023-12-31 LAB — MAGNESIUM: Magnesium: 1.7 mg/dL (ref 1.7–2.4)

## 2023-12-31 MED ORDER — ALUM & MAG HYDROXIDE-SIMETH 200-200-20 MG/5ML PO SUSP
30.0000 mL | Freq: Four times a day (QID) | ORAL | Status: DC | PRN
  Administered 2023-12-31 – 2024-01-01 (×2): 30 mL via ORAL
  Filled 2023-12-31 (×2): qty 30

## 2023-12-31 MED ORDER — HYDROMORPHONE HCL 1 MG/ML IJ SOLN
1.0000 mg | INTRAMUSCULAR | Status: AC | PRN
  Administered 2023-12-31 (×2): 1 mg via INTRAVENOUS
  Filled 2023-12-31: qty 1

## 2023-12-31 MED ORDER — HYDROMORPHONE HCL 1 MG/ML IJ SOLN
INTRAMUSCULAR | Status: AC
  Filled 2023-12-31: qty 1

## 2023-12-31 MED ORDER — PENTAFLUOROPROP-TETRAFLUOROETH EX AERO
INHALATION_SPRAY | CUTANEOUS | Status: AC
  Filled 2023-12-31: qty 30

## 2023-12-31 NOTE — Progress Notes (Signed)
 Progress Note   Patient: [name] FMW:[id number] DOB: Aug 03, 1960 DOA: 12/30/2023  DOS: the patient was seen and examined on 12/31/2023   Brief hospital course:   63 y.o. female with PMH significant for ESRD on hemodialysis, Noncompliant with dialysis, homeless, COPD, Chronic diastolic CHF, Chronic pain syndrome, history of polysubstance use including cocaine, depression, GERD, alcohol  use, chronic tobacco use presented in the ED with abdominal distention and shortness of breath.    Assessment and Plan:  Acute hypoxic respiratory failure - Secondary to volume overload.  Initially placed on 3 L nasal cannula.-Wean O2 as tolerated.  Acute exacerbation HFpEF/ESRD volume overload - Noncompliant with HD the outpatient setting.  Homeless, comes to ED for HD periodically.  Nephrology on board and following closely.  HD today.  Ascites - No known history of ascites in the past but noted abdominal distention on presentation.  HD today.  Will likely pursue paracentesis tomorrow for symptomatic therapy.  COPD - Inhalers/nebulizers on board.  Wean O2 as above.  Does not appear to be in acute exacerbation.  Anemia of chronic kidney disease - Hemoglobin 6.7 this morning.  Possibly exacerbated by volume overload and chronic kidney disease.  Will likely undergo dialysis versus recheck hemoglobin in a.m.  No acute bleeding appreciated.  Severe peripheral arterial disease - No significant change.  Can follow-up in clinic as needed.  Goals of care - Patient is homeless.  Will consult with Child psychotherapist.  Working closely with case management and nephrology on disposition planning.    Subjective: Patient evaluated this morning at hemodialysis.  Frail with noted abdominal distention, states she is feels weak and worn out and mildly short of breath.  Improved with oxygen .  Denies any fever, chest pain, nausea, vomiting, abdominal pain.  Admits being noncompliant with hemodialysis.  Physical  Exam:  Vitals:   12/31/23 0940 12/31/23 1010 12/31/23 1040 12/31/23 1110  BP: 129/78 121/75 (!) 144/80 122/80  Pulse: 88     Resp: (!) 21 12 12 16   Temp:      TempSrc:      SpO2: 100%  100% 100%  Weight:      Height:        GENERAL:  Alert, pleasant, cachectic HEENT:  EOMI CARDIOVASCULAR:  RRR, no murmurs appreciated RESPIRATORY:  Clear to auscultation, no wheezing, rales, or rhonchi GASTROINTESTINAL:  distended, somewhat tight EXTREMITIES: Thin, trace edema BL LE NEURO:  No new focal deficits appreciated SKIN:  No rashes noted PSYCH:  Appropriate mood and affect     Data Reviewed:  Imaging Studies: CT Angio Chest/Abd/Pel for Dissection W and/or Wo Contrast Result Date: 12/30/2023 EXAM: CT CHEST, ABDOMEN AND PELVIS WITH AND WITHOUT CONTRAST 12/30/2023 03:51:10 PM TECHNIQUE: CT of the chest, abdomen and pelvis was performed with and without the administration of 100 mL of iohexol  (OMNIPAQUE ) 350 MG/ML injection. Multiplanar reformatted images are provided for review. Automated exposure control, iterative reconstruction, and/or weight based adjustment of the mA/kV was utilized to reduce the radiation dose to as low as reasonably achievable. COMPARISON: 10/23/2023 CLINICAL HISTORY: Aortic aneurysm suspected. Shortness of Breath; CT Angio Chest/Abd/Pel for Dissection W and/or Wo Contrast; Aortic aneurysm suspected. Omni 350 ; Pt bib [facility] coming [facility] staying [facility]. Pt has missed dialysis for about a month and states she has been unable to go due to not having the resources. Pt abdomen distended and pt c/o shortness of breath and lower back pain. FINDINGS: CHEST: MEDIASTINUM AND LYMPH NODES: Mild cardiomegaly. Coronary artery calcifications  are noted. The central airways are clear. No mediastinal, hilar or axillary lymphadenopathy. LUNGS AND PLEURA: No focal consolidation or pulmonary edema. Small left pleural effusion is noted. Minimal right pleural effusion. Minimal  bibasilar subsegmental atelectasis. Minimal emphysematous disease. No pneumothorax. ABDOMEN AND PELVIS: LIVER: Probable hepatic cirrhosis. GALLBLADDER AND BILE DUCTS: Gallbladder is unremarkable. No biliary ductal dilatation. SPLEEN: No acute abnormality. PANCREAS: No acute abnormality. ADRENAL GLANDS: No acute abnormality. KIDNEYS, URETERS AND BLADDER: Severe stenosis involving the proximal left renal artery. The right renal artery is not well opacified. No stones in the kidneys or ureters. No hydronephrosis. No perinephric or periureteral stranding. Urinary bladder is unremarkable. GI AND BOWEL: Stomach demonstrates no acute abnormality. Severe narrowing at the origin of the inferior mesenteric artery. There is no bowel obstruction. REPRODUCTIVE ORGANS: No acute abnormality. PERITONEUM AND RETROPERITONEUM: Moderate ascites. No free air. VASCULATURE: Aortic atherosclerosis. Occlusion of proximal portions of both superficial femoral arteries. Moderate stenosis of the origin of the right common iliac artery secondary to calcified plaque. Severe stenosis involving the proximal left renal artery. The right renal artery is not well opacified. Severe narrowing at the origin of the inferior mesenteric artery. ABDOMINAL AND PELVIS LYMPH NODES: No lymphadenopathy. REPRODUCTIVE ORGANS: No acute abnormality. BONES AND SOFT TISSUES: No acute osseous abnormality. No focal soft tissue abnormality. IMPRESSION: 1. No evidence of aortic dissection or aneurysm. 2. Severe stenosis of the proximal left renal artery. Right renal artery not well opacified. 3. Severe narrowing at the origin of the inferior mesenteric artery. 4. Moderate stenosis at the origin of the right common iliac artery due to calcified plaque. 5. Occlusion of the proximal portions of both superficial femoral arteries. 6. Probable hepatic cirrhosis with moderate ascites. Electronically signed by: Lynwood Seip MD 12/30/2023 04:09 PM EDT RP Workstation: [id number]    DG Chest Port 1 View Result Date: 12/30/2023 CLINICAL DATA:  Questionable sepsis - evaluate for abnormality EXAM: PORTABLE CHEST - 1 VIEW COMPARISON:  11/24/2023 FINDINGS: Biapical pleural thickening. Small right pleural effusion with right basilar atelectasis. No new airspace consolidation or pneumothorax. Mild cardiomegaly. Tortuous aorta with aortic atherosclerosis. No acute fracture or destructive lesions. Multilevel thoracic osteophytosis. IMPRESSION: Mild cardiomegaly with small right pleural effusion. Electronically Signed   By: Rogelia Myers M.D.   On: 12/30/2023 14:28    There are no new results to review at this time.  Previous records (including but not limited to H&P, progress notes, nursing notes, TOC management) were reviewed in assessment of this patient.  Labs: CBC: Recent Labs  Lab 12/30/23 1222 12/30/23 1348 12/30/23 1839 12/31/23 0442 12/31/23 0547  WBC 6.5  --  5.7 6.6  --   HGB 7.6* 8.5*  7.8* 8.2* 6.8* 6.7*  HCT 23.7* 25.0*  23.0* 25.4* 20.9* 20.8*  MCV 94.4  --  95.5 95.4  --   PLT 411*  --  410* 347  --    Basic Metabolic Panel: Recent Labs  Lab 12/30/23 1222 12/30/23 1348 12/30/23 1839 12/31/23 0442  NA 132* 136  136  --  131*  K 3.6 3.7  3.7  --  4.2  CL 103 108  --  104  CO2 13*  --   --  14*  GLUCOSE 90 87  --  105*  BUN 56* 55*  --  56*  CREATININE 6.65* 6.90* 6.44* 6.37*  CALCIUM  7.9*  --   --  7.6*  MG  --   --   --  1.7  PHOS  --   --   --  7.1*   Liver Function Tests: Recent Labs  Lab 12/30/23 1222 12/31/23 0442  AST 13* 15  ALT 10 7  ALKPHOS 90 77  BILITOT 0.4 0.4  PROT 6.2* 5.5*  ALBUMIN  2.6* 2.3*   CBG: Recent Labs  Lab 12/30/23 1225  GLUCAP 74    Scheduled Meds:  amLODipine   10 mg Oral Daily   atorvastatin   10 mg Oral Daily   calcitRIOL   0.25 mcg Oral Daily   carvedilol   6.25 mg Oral BID WC   Chlorhexidine  Gluconate Cloth  6 each Topical Q0600   docusate sodium   100 mg Oral BID   ferrous sulfate   325 mg  Oral Q breakfast   guaiFENesin   600 mg Oral BID   heparin   5,000 Units Subcutaneous Q8H   sodium bicarbonate   1,300 mg Oral BID   sodium chloride  flush  3 mL Intravenous Q12H   sucralfate   1 g Oral TID WC & HS   Continuous Infusions:  sodium chloride      cefTRIAXone  (ROCEPHIN )  IV     PRN Meds:.sodium chloride , acetaminophen  **OR** acetaminophen , albuterol , ALPRAZolam , HYDROmorphone  (DILAUDID ) injection, ondansetron  **OR** ondansetron  (ZOFRAN ) IV, sodium chloride  flush  Family Communication: None at bedside  Disposition: Status is: Inpatient Remains inpatient appropriate because: HD, volume overload     Time spent: 40 minutes  Length of inpatient stay: 1 days  Author: Carliss LELON Canales, DO 12/31/2023 12:22 PM  For on call review [email or web address].

## 2023-12-31 NOTE — Consult Note (Signed)
 Renal Service Consult Note [facility] Lamar JONETTA Fret, MD  Patient: [name] Date: 12/31/2023 Requesting Physician: Dr. Arlon  Reason for Consult: ESRD pt w/ abd pain, new ascites, SOB HPI: The patient is a 63 y.o. year-old w/ PMH as below who presented to ED yesterday afternoon complaining of abdominal distention and shortness of breath.  In ED BP was 180/80, HR 85, RR 12-26, temp 98, 100% O2 sat on 3 L [place] O2.  Labs showed K+ 3.7, BUN 55 and creatinine 6.9.  Calcium  7.6.  Albumin  2.3.  Hemoglobin 6.8.  Chest x-ray showed a small left effusion.  Patient was admitted for fluid overload load and hypoxic respiratory failure.  We are asked to see for dialysis   Pt seen and HD unit.  Her main complaint is very swollen abdomen.  Denies having prior history of ascites or needing paracentesis.  Shortness of breath is mild.   ROS - denies CP, no joint pain, no HA, no blurry vision, no rash, no diarrhea, no nausea/ vomiting   Past Medical History  Past Medical History:  Diagnosis Date   Acute on chronic blood loss anemia 08/01/2022   Acute on chronic diastolic CHF (congestive heart failure) (HCC) 08/12/2022   Acute on chronic respiratory failure with hypoxia (HCC) 05/16/2023   Acute pulmonary edema (HCC) 05/16/2023   Acute seasonal allergic rhinitis due to pollen 02/22/2016   Alcohol  abuse    Allergy    Anxiety    Arteriovenous fistula infection, initial encounter 04/16/2023   Arthritis    Asthma    Cannabis abuse    Cocaine abuse (HCC)    COPD (chronic obstructive pulmonary disease) (HCC)    COPD with acute exacerbation (HCC) 10/25/2022   COVID 05/16/2023   Depression    Drug addiction (HCC)    GERD (gastroesophageal reflux disease)    GIB (gastrointestinal bleeding) 08/19/2019   Heart murmur    Heme positive stool 08/14/2022   Homelessness    Hx of adenomatous colonic polyps    Hyperlipidemia    Hypertension    pt stated every once in a while BP will  be high but has not been prescribed medication for HTN.    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) 02/22/2016   SIRS (systemic inflammatory response syndrome) (HCC) 10/25/2022   Past Surgical History  Past Surgical History:  Procedure Laterality Date   A/V FISTULAGRAM Right 01/31/2023   Procedure: A/V Fistulagram;  Surgeon: Gretta Lonni PARAS, MD;  Location: [facility] INVASIVE CV LAB;  Service: Cardiovascular;  Laterality: Right;   AV FISTULA PLACEMENT Right 08/14/2022   Procedure: RIGHT ARM BRACHIOBASILIC ATERIOVENOUS FISTULA CREATION;  Surgeon: Magda Debby SAILOR, MD;  Location: [facility] OR;  Service: Vascular;  Laterality: Right;   BASCILIC VEIN TRANSPOSITION Right 11/13/2022   Procedure: RIGHT ARM SECOND STAGE BASILIC VEIN TRANSPOSITION;  Surgeon: Magda Debby SAILOR, MD;  Location[facility] OR;  Service: Vascular;  Laterality: Right;   BIOPSY  01/01/2019   Procedure: BIOPSY;  Surgeon: Albertus Gordy HERO, MD;  Location: [facility] ENDOSCOPY;  Service: Endoscopy;;   BIOPSY  11/17/2021   Procedure: BIOPSY;  Surgeon: Charlanne Groom, MD;  Location: [facility] ENDOSCOPY;  Service: Gastroenterology;;   CESAREAN SECTION  1989   COLONOSCOPY  11/07/2020   2018   CYSTOSCOPY W/ URETERAL STENT PLACEMENT Left 08/01/2018   Procedure: CYSTOSCOPY WITH RETROGRADE PYELOGRAM/URETERAL STENT PLACEMENT;  Surgeon: Carolee Sherwood JONETTA DOUGLAS, MD;  Location: [facility] ORS;  Service: Urology;  Laterality: Left;   CYSTOSCOPY WITH RETROGRADE PYELOGRAM, URETEROSCOPY AND STENT PLACEMENT Left 01/15/2019   Procedure: CYSTOSCOPY WITH RETROGRADE PYELOGRAM, URETEROSCOPY AND STENT PLACEMENT;  Surgeon: Carolee Sherwood JONETTA DOUGLAS, MD;  Location: [facility] ORS;  Service: Urology;  Laterality: Left;   ENTEROSCOPY N/A 08/16/2022    Procedure: ENTEROSCOPY;  Surgeon: Wilhelmenia Aloha Raddle., MD;  Location: [facility] ENDOSCOPY;  Service: Gastroenterology;  Laterality: N/A;   ENTEROSCOPY N/A 10/31/2022   Procedure: ENTEROSCOPY;  Surgeon: Albertus Gordy HERO, MD;  Location: [facility] ENDOSCOPY;  Service: Gastroenterology;  Laterality: N/A;   ENTEROSCOPY N/A 05/09/2023   Procedure: ENTEROSCOPY;  Surgeon: Federico Rosario BROCKS, MD;  Location: [facility] ENDOSCOPY;  Service: Gastroenterology;  Laterality: N/A;   ENTEROSCOPY N/A 09/09/2023   Procedure: ENTEROSCOPY;  Surgeon: Federico Rosario BROCKS, MD;  Location: [facility] ENDOSCOPY;  Service: Gastroenterology;  Laterality: N/A;   ENTEROSCOPY N/A 10/26/2023   Procedure: ENTEROSCOPY;  Surgeon: Albertus Gordy HERO, MD;  Location: [facility] ENDOSCOPY;  Service: Gastroenterology;  Laterality: N/A;   ESOPHAGOGASTRODUODENOSCOPY (EGD) WITH PROPOFOL  N/A 01/01/2019   Procedure: ESOPHAGOGASTRODUODENOSCOPY (EGD) WITH PROPOFOL ;  Surgeon: Albertus Gordy HERO, MD;  Location: [facility] ENDOSCOPY;  Service: Endoscopy;  Laterality: N/A;   ESOPHAGOGASTRODUODENOSCOPY (EGD) WITH PROPOFOL  N/A 08/21/2019   Procedure: ESOPHAGOGASTRODUODENOSCOPY (EGD) WITH PROPOFOL ;  Surgeon: Shila Gustav GAILS, MD;  Location: [facility] ENDOSCOPY;  Service: Endoscopy;  Laterality: N/A;   ESOPHAGOGASTRODUODENOSCOPY (EGD) WITH PROPOFOL  N/A 11/17/2021   Procedure: ESOPHAGOGASTRODUODENOSCOPY (EGD) WITH PROPOFOL ;  Surgeon: Charlanne Groom, MD;  Location: [facility] ENDOSCOPY;  Service: Gastroenterology;  Laterality: N/A;   FRACTURE SURGERY Left 2011   arm   HEMOSTASIS CLIP PLACEMENT  08/16/2022   Procedure: HEMOSTASIS CLIP PLACEMENT;  Surgeon: Wilhelmenia Aloha Raddle., MD;  Location: [facility] ENDOSCOPY;  Service: Gastroenterology;;   HOT HEMOSTASIS N/A 08/16/2022   Procedure: HOT HEMOSTASIS (ARGON PLASMA COAGULATION/BICAP);  Surgeon: Wilhelmenia Aloha Raddle., MD;  Location: [facility] ENDOSCOPY;  Service: Gastroenterology;  Laterality: N/A;   HOT HEMOSTASIS N/A 10/31/2022   Procedure: HOT HEMOSTASIS (ARGON PLASMA COAGULATION/BICAP);   Surgeon: Albertus Gordy HERO, MD;  Location: [facility] ENDOSCOPY;  Service: Gastroenterology;  Laterality: N/A;   HOT HEMOSTASIS N/A 05/09/2023   Procedure: HOT HEMOSTASIS (ARGON PLASMA COAGULATION/BICAP);  Surgeon: Federico Rosario BROCKS, MD;  Location: [facility] ENDOSCOPY;  Service: Gastroenterology;  Laterality: N/A;   HOT HEMOSTASIS N/A 09/09/2023   Procedure: EGD, WITH ARGON PLASMA COAGULATION;  Surgeon: Federico Rosario BROCKS, MD;  Location: [facility] ENDOSCOPY;  Service: Gastroenterology;  Laterality: N/A;   INSERTION OF DIALYSIS CATHETER Right 08/14/2022   Procedure: INSERTION OF TUNNELED DIALYSIS CATHETER USING PALINDROME CATHETER KIT 19CM;  Surgeon: Magda Debby SAILOR, MD;  Location: [facility] OR;  Service: Vascular;  Laterality: Right;   IR THORACENTESIS ASP PLEURAL SPACE W/IMG GUIDE  09/15/2023   POLYPECTOMY  08/16/2022   Procedure: POLYPECTOMY;  Surgeon: Wilhelmenia Aloha Raddle., MD;  Location: [facility] ENDOSCOPY;  Service: Gastroenterology;;   SUBMUCOSAL TATTOO INJECTION  08/16/2022   Procedure: SUBMUCOSAL TATTOO INJECTION;  Surgeon: Wilhelmenia Aloha Raddle., MD;  Location: [facility] ENDOSCOPY;  Service: Gastroenterology;;   UPPER GASTROINTESTINAL ENDOSCOPY     Family History  Family History  Problem Relation Age of Onset   Diabetes Father    Colon cancer Neg Hx    Esophageal cancer Neg Hx    Rectal cancer Neg Hx  Stomach cancer Neg Hx    Social History  reports that she has been smoking cigarettes. She has been exposed to tobacco smoke. She has never used smokeless tobacco. She reports that she does not currently use alcohol . She reports current drug use. Drug: Marijuana. Allergies  Allergies  Allergen Reactions   Zestril  [Lisinopril ] Other (See Comments)    Hyperkalemia   Home medications Prior to Admission medications   Medication Sig Start Date End Date Taking? Authorizing Provider  acetaminophen  (TYLENOL ) 325 MG tablet Take 2 tablets (650 mg total) by mouth every 6 (six) hours as needed for mild pain (pain score 1-3). 11/02/23  Yes  Dennise Lavada POUR, MD  albuterol  (VENTOLIN  HFA) 108 [phone] Base) MCG/ACT inhaler Inhale 2 puffs into the lungs every 6 (six) hours as needed for wheezing or shortness of breath. 11/02/23  Yes Singh, Prashant K, MD  ALPRAZolam  (XANAX ) 0.25 MG tablet Take 1 tablet (0.25 mg total) by mouth at bedtime as needed for anxiety. 11/02/23  Yes Dennise Lavada POUR, MD  amLODipine  (NORVASC ) 10 MG tablet Take 1 tablet (10 mg total) by mouth daily. 11/02/23  Yes Dennise Lavada POUR, MD  atorvastatin  (LIPITOR) 10 MG tablet Take 1 tablet (10 mg total) by mouth daily. 11/02/23  Yes Dennise Lavada POUR, MD  calcitRIOL  (ROCALTROL ) 0.25 MCG capsule Take 1 capsule (0.25 mcg total) by mouth daily. 11/02/23  Yes Singh, Prashant K, MD  carvedilol  (COREG ) 6.25 MG tablet Take 1 tablet (6.25 mg total) by mouth 2 (two) times daily with a meal. 11/02/23  Yes Dennise Lavada POUR, MD  ferrous sulfate  (FEROSUL) 325 (65 FE) MG tablet Take 1 tablet (325 mg total) by mouth daily with breakfast. 11/02/23  Yes Singh, Prashant K, MD  gabapentin  (NEURONTIN ) 100 MG capsule Take 1 capsule (100 mg total) by mouth 2 (two) times daily for 60 doses. 11/02/23 12/30/23 Yes Dennise Lavada POUR, MD  nicotine  (NICODERM CQ  - DOSED IN MG/24 HOURS) 21 mg/24hr patch Place 1 patch (21 mg total) onto the skin daily. 11/02/23  Yes Dennise Lavada POUR, MD  oxyCODONE  (ROXICODONE ) 5 MG immediate release tablet Take 1 tablet (5 mg total) by mouth every 4 (four) hours as needed for severe pain (pain score 7-10). 11/25/23  Yes Dreama Longs, MD  sodium bicarbonate  650 MG tablet Take 2 tablets (1,300 mg total) by mouth 2 (two) times daily. 11/02/23 01/31/24 Yes Dennise Lavada POUR, MD  torsemide  (DEMADEX ) 100 MG tablet Take 1 tablet (100 mg total) by mouth daily. 11/25/23 12/30/23 Yes Dreama Longs, MD  traZODone  (DESYREL ) 50 MG tablet Take 50 mg by mouth at bedtime. 12/24/23  Yes [provider]  TRELEGY ELLIPTA  100-62.5-25 MCG/ACT AEPB Inhale 1 puff into the lungs daily. 11/02/23   Yes Dennise Lavada POUR, MD  pantoprazole  (PROTONIX ) 40 MG tablet Take 1 tablet (40 mg total) by mouth 2 (two) times daily. 11/02/23 12/02/23  Dennise Lavada POUR, MD  sucralfate  (CARAFATE ) 1 GM/10ML suspension Take 10 mLs (1 g total) by mouth 4 (four) times daily -  with meals and at bedtime. Patient not taking: Reported on 12/30/2023 11/02/23   Dennise Lavada POUR, MD     Vitals:   12/31/23 0840 12/31/23 0855 12/31/23 0911 12/31/23 0940  BP: (!) 173/73 (!) 163/73 (!) 154/81 129/78  Pulse: 79 86 79 88  Resp: (!) 21 (!) 22 12 (!) 21  Temp:      TempSrc:      SpO2: 100% 100% 100% 100%  Weight:  Height:       Exam Gen alert, in pain, [place] O2 in place Sclera anicteric, throat clear  No jvd or bruits Chest clear bilat to bases RRR no MRG Abd soft ntnd no mass, 3+ tight ascites Ext 1+ LE edema, no other edema Neuro is alert, Ox 3 , nf    RUA AVF + bruit  Home bp meds: Norvasc  10 mg daily Coreg  6.25 mg twice daily   OP HD: no OP HD unit  Pt is basically homeless and comes to hospital for dialysis about once a month or so    Assessment/ Plan: Ascites: believe this is new, patient states she has not had a paracentesis in the past.  She will likely need a paracentesis because HD often will not be able to resolve significant ascites. Have d/w pmd.  SOB: no edema per CXR, though pt is vol overloaded. Severe ascites may be contributing to her shortness of breath. ESRD: no OP HD unit. Comes to hospital for HD when feeling bad. HD in progress today.  HTN: bp's up, get vol down w/ HD, cont home meds per pmd Volume: vol excess w/ ascites, mild LE edema. As above.  Anemia of esrd: Hb low in 7-9 range here. Would rec transfuse prn. Don't think that esa's will be helpful given her infrequent visits to the renal community. Will check Fe levels.        Myer Fret  MD [facility] 12/31/2023, 10:03 AM  Recent Labs  Lab 12/30/23 1222 12/30/23 1348 12/30/23 1839 12/31/23 0442 12/31/23 0547  HGB  7.6* 8.5*  7.8* 8.2* 6.8* 6.7*  ALBUMIN  2.6*  --   --  2.3*  --   CALCIUM  7.9*  --   --  7.6*  --   PHOS  --   --   --  7.1*  --   CREATININE 6.65* 6.90* 6.44* 6.37*  --   K 3.6 3.7  3.7  --  4.2  --    Inpatient medications:  amLODipine   10 mg Oral Daily   atorvastatin   10 mg Oral Daily   calcitRIOL   0.25 mcg Oral Daily   carvedilol   6.25 mg Oral BID WC   Chlorhexidine  Gluconate Cloth  6 each Topical Q0600   docusate sodium   100 mg Oral BID   ferrous sulfate   325 mg Oral Q breakfast   guaiFENesin   600 mg Oral BID   heparin   5,000 Units Subcutaneous Q8H   sodium bicarbonate   1,300 mg Oral BID   sodium chloride  flush  3 mL Intravenous Q12H   sucralfate   1 g Oral TID WC & HS    sodium chloride      cefTRIAXone  (ROCEPHIN )  IV     sodium chloride , acetaminophen  **OR** acetaminophen , albuterol , ALPRAZolam , HYDROmorphone  (DILAUDID ) injection, ondansetron  **OR** ondansetron  (ZOFRAN ) IV, sodium chloride  flush

## 2023-12-31 NOTE — Hospital Course (Addendum)
 63 y.o. female with PMH significant for ESRD on hemodialysis, Noncompliant with dialysis, homeless, COPD, Chronic diastolic CHF, Chronic pain syndrome, history of polysubstance use including cocaine, depression, GERD, alcohol  use, chronic tobacco use presented in the ED with abdominal distention and shortness of breath.     Assessment and Plan:   Acute hypoxic respiratory failure - Secondary to volume overload.  Initially placed on 3 L nasal cannula.  Able to be weaned down to room air.    Acute exacerbation HFpEF/ESRD/volume overload - Noncompliant with HD the outpatient setting.  Homeless, comes to ED for HD periodically.  Nephrology on board and following closely.  Tolerated initial HD/UF removing 3.3 L.  Another 3L UF removed yesterday.   Ascites - No known history of ascites in the past but noted abdominal distention on presentation.  S/p paracentesis 10/17 with 2.8 L removed.  Studies not suggestive of infection.   COPD - Inhalers/nebulizers on board.  Wean O2 as above.  Does not appear to be in acute exacerbation.   Anemia of chronic kidney disease - S/p 1 unit PRBCs yesterday 10/17.  Hemoglobin stable.  No active bleeding appreciated.     Severe peripheral arterial disease/peripheral neuropathy - Pain not well-controlled.  Continues on gabapentin  3 times daily 100 mg.  oxycodone  IR 5 mg from every 4 hours as needed.   Possible urinary tract infection (POA) - UA noting LE, WBCs, bacteria.  Preliminary urine cultures noting possible group B strep.  Currently on ceftriaxone .   Polysubstance abuse - Noted positive tox screen for cocaine on presentation.  Encourage cessation.   Goals of care - Patient is homeless.  Will consult with Child psychotherapist.  Working closely with case management and nephrology on disposition planning.  Pt is very anxious about her disposition and medications.

## 2023-12-31 NOTE — Progress Notes (Signed)
 Pt arrived to unit from ED VSS, A/O x 4,  CCMD called ,CHG given, pt oriented to unit. Pt states she is homeless.  Chancey Cullinane K Bridgitte Felicetti, RN    12/31/23 1525  Vitals  Temp 98 F (36.7 C)  Temp Source Oral  BP (!) 163/84  MAP (mmHg) 107  BP Location Left Arm  BP Method Automatic  Patient Position (if appropriate) Lying  Pulse Rate 79  Pulse Rate Source Monitor  ECG Heart Rate 78  Resp 20  Level of Consciousness  Level of Consciousness Alert  Oxygen  Therapy  SpO2 100 %  O2 Device Nasal Cannula  O2 Flow Rate (L/min) 1 L/min  MEWS Score  MEWS Temp 0  MEWS Systolic 0  MEWS Pulse 0  MEWS RR 0  MEWS LOC 0  MEWS Score 0  MEWS Score Color Landy

## 2023-12-31 NOTE — Procedures (Signed)
 S: pt seen in HD. Needles being placed.   Vitals:   12/31/23 0840 12/31/23 0855 12/31/23 0911 12/31/23 0940  BP: (!) 173/73 (!) 163/73 (!) 154/81 129/78  Pulse: 79 86 79 88  Resp: (!) 21 (!) 22 12 (!) 21  Temp:      TempSrc:      SpO2: 100% 100% 100% 100%  Weight:      Height:        Recent Labs  Lab 12/30/23 1222 12/30/23 1348 12/30/23 1839 12/31/23 0442 12/31/23 0547  HGB 7.6* 8.5*  7.8* 8.2* 6.8* 6.7*  ALBUMIN  2.6*  --   --  2.3*  --   CALCIUM  7.9*  --   --  7.6*  --   PHOS  --   --   --  7.1*  --   CREATININE 6.65* 6.90* 6.44* 6.37*  --   K 3.6 3.7  3.7  --  4.2  --     Inpatient medications:  amLODipine   10 mg Oral Daily   atorvastatin   10 mg Oral Daily   calcitRIOL   0.25 mcg Oral Daily   carvedilol   6.25 mg Oral BID WC   Chlorhexidine  Gluconate Cloth  6 each Topical Q0600   docusate sodium   100 mg Oral BID   ferrous sulfate   325 mg Oral Q breakfast   guaiFENesin   600 mg Oral BID   heparin   5,000 Units Subcutaneous Q8H   sodium bicarbonate   1,300 mg Oral BID   sodium chloride  flush  3 mL Intravenous Q12H   sucralfate   1 g Oral TID WC & HS    sodium chloride      cefTRIAXone  (ROCEPHIN )  IV     sodium chloride , acetaminophen  **OR** acetaminophen , albuterol , ALPRAZolam , HYDROmorphone  (DILAUDID ) injection, ondansetron  **OR** ondansetron  (ZOFRAN ) IV, sodium chloride  flush  I was present at the procedure, reviewed the HD regimen and made appropriate changes.   Myer Fret MD  [facility] 12/31/2023, 10:23 AM

## 2023-12-31 NOTE — Procedures (Signed)
 HD Note:  Some information was entered later than the data was gathered due to patient care needs. The stated time with the data is accurate.  Received patient in bed to unit.   Alert and oriented.  Patient irritable and states she is extreme pain in her back.  See [facility]  Informed consent signed and in chart.   Access used: right upper arm fistula Access issues: Arterial pressures elevated.  BFR reduced.  See flowsheet  Patient tolerated treatment well.   Patient removed nasal cannula for most of the treatment.  O2 sats stayed at 100%   TX duration: 3 hours and 20 min.  Patient arterial line was unable to continue to pull in the last 10 min.  Treatment stopped 10 min early  Alert, without acute distress.  Total UF removed: 3300 ml  Hand-off given to patient's nurse.   Transported back to the room   Littleton Haub L. Lenon, RN [facility].

## 2023-12-31 NOTE — ED Notes (Signed)
 Segars MD notified by secure chat of critical Hgb 6.8

## 2023-12-31 NOTE — ED Notes (Signed)
 Patient refused initial transport to Dialysis d/t breakfast. Patient told she could take her breakfast with her. Dialysis contacted by transport to come get patient.

## 2024-01-01 DIAGNOSIS — J441 Chronic obstructive pulmonary disease with (acute) exacerbation: Secondary | ICD-10-CM

## 2024-01-01 LAB — BASIC METABOLIC PANEL WITH GFR
Anion gap: 14 (ref 5–15)
BUN: 28 mg/dL — ABNORMAL HIGH (ref 8–23)
CO2: 21 mmol/L — ABNORMAL LOW (ref 22–32)
Calcium: 7.6 mg/dL — ABNORMAL LOW (ref 8.9–10.3)
Chloride: 97 mmol/L — ABNORMAL LOW (ref 98–111)
Creatinine, Ser: 4.22 mg/dL — ABNORMAL HIGH (ref 0.44–1.00)
GFR, Estimated: 11 mL/min — ABNORMAL LOW
Glucose, Bld: 98 mg/dL (ref 70–99)
Potassium: 3.7 mmol/L (ref 3.5–5.1)
Sodium: 132 mmol/L — ABNORMAL LOW (ref 135–145)

## 2024-01-01 LAB — PREPARE RBC (CROSSMATCH)

## 2024-01-01 LAB — CBC
HCT: 19.9 % — ABNORMAL LOW (ref 36.0–46.0)
Hemoglobin: 6.5 g/dL — CL (ref 12.0–15.0)
MCH: 30.8 pg (ref 26.0–34.0)
MCHC: 32.7 g/dL (ref 30.0–36.0)
MCV: 94.3 fL (ref 80.0–100.0)
Platelets: 269 K/uL (ref 150–400)
RBC: 2.11 MIL/uL — ABNORMAL LOW (ref 3.87–5.11)
RDW: 17.2 % — ABNORMAL HIGH (ref 11.5–15.5)
WBC: 6.3 K/uL (ref 4.0–10.5)
nRBC: 0 % (ref 0.0–0.2)

## 2024-01-01 LAB — HEMOGLOBIN AND HEMATOCRIT, BLOOD
HCT: 24.3 % — ABNORMAL LOW (ref 36.0–46.0)
Hemoglobin: 8.1 g/dL — ABNORMAL LOW (ref 12.0–15.0)

## 2024-01-01 LAB — HEPATITIS B SURFACE ANTIBODY, QUANTITATIVE: Hep B S AB Quant (Post): <3.5 m[IU]/mL — ABNORMAL LOW

## 2024-01-01 LAB — MAGNESIUM: Magnesium: 1.7 mg/dL (ref 1.7–2.4)

## 2024-01-01 MED ORDER — TRAZODONE HCL 50 MG PO TABS
50.0000 mg | ORAL_TABLET | Freq: Every day | ORAL | Status: DC
  Administered 2024-01-01 – 2024-01-16 (×15): 50 mg via ORAL
  Filled 2024-01-01 (×15): qty 1

## 2024-01-01 MED ORDER — OXYCODONE HCL 5 MG PO TABS
5.0000 mg | ORAL_TABLET | Freq: Four times a day (QID) | ORAL | Status: DC | PRN
  Administered 2024-01-01 – 2024-01-02 (×4): 5 mg via ORAL
  Filled 2024-01-01 (×5): qty 1

## 2024-01-01 MED ORDER — SODIUM CHLORIDE 0.9% IV SOLUTION: Freq: Once | INTRAVENOUS | Status: AC

## 2024-01-01 MED ORDER — GABAPENTIN 100 MG PO CAPS
100.0000 mg | ORAL_CAPSULE | Freq: Two times a day (BID) | ORAL | Status: DC
  Administered 2024-01-01 – 2024-01-02 (×3): 100 mg via ORAL
  Filled 2024-01-01 (×3): qty 1

## 2024-01-01 NOTE — Progress Notes (Signed)
 [facility] Progress Note  Subjective:  3.5 L UF yesterday on HD Today on room air BP 160/ 90 range  Vitals:   01/01/24 1026 01/01/24 1030 01/01/24 1120 01/01/24 1130  BP: (!) 179/80 (!) 167/95  (!) 170/85  Pulse:  85 74 80  Resp:  18 18 20   Temp: 98.3 F (36.8 C)  98.3 F (36.8 C) 98.5 F (36.9 C)  TempSrc: Oral   Oral  SpO2:  100% 100% 100%  Weight:      Height:        Exam: Gen alert, on RA today No jvd  Chest clear bilat to bases RRR no MRG Abd soft ntnd no mass, 3+ tight ascites Ext no edema Neuro is alert, Ox 3 , nf    RUA AVF + bruit   Home bp meds: Norvasc  10 mg daily Coreg  6.25 mg twice daily    OP HD: no OP HD unit  Pt is basically homeless and comes to hospital for dialysis every 3-4 wks or so       Assessment/ Plan: SOB/ volume overload: no edema per CXR, though pt is vol overloaded. Severe ascites may be contributing to her shortness of breath. Got 3.5 L off w/ HD yest and is off O2 today. Improving. BP's still high, room for more vol removal.  Ascites: believe this is new, patient states she has not had this swelling in the past.  HD usually will not fix significant ascites, have d/w pmd.  ESRD: no OP HD unit. Comes to hospital for HD when feeling bad. Had HD yesterday. Next HD tomorrow.  HTN/ vol: bp's high, getting norvasc , coreg  as at home. Cont to lower vol w/ HD as tolerated.  Anemia of esrd: Hb low in 6-8 range here. Per pmd will be getting prbc's today. Don't think that esa's will be helpful given her infrequent visits to renal caregivers. Tsat 30% on 10/15, no IV fe needed.        Myer Fret MD  [facility] 01/01/2024, 11:58 AM  Recent Labs  Lab 12/30/23 1222 12/30/23 1348 12/31/23 0442 12/31/23 0547 01/01/24 0326  HGB 7.6*   < > 6.8* 6.7* 6.5*  ALBUMIN  2.6*  --  2.3*  --   --   CALCIUM  7.9*  --  7.6*  --  7.6*  PHOS  --   --  7.1*  --   --   CREATININE 6.65*   < > 6.37*  --  4.22*  K 3.6   < > 4.2  --  3.7   < > =  values in this interval not displayed.   Recent Labs  Lab 12/31/23 0442  IRON  55  TIBC 186*  FERRITIN 256   Inpatient medications:  amLODipine   10 mg Oral Daily   atorvastatin   10 mg Oral Daily   calcitRIOL   0.25 mcg Oral Daily   carvedilol   6.25 mg Oral BID WC   Chlorhexidine  Gluconate Cloth  6 each Topical Q0600   docusate sodium   100 mg Oral BID   ferrous sulfate   325 mg Oral Q breakfast   gabapentin   100 mg Oral BID   guaiFENesin   600 mg Oral BID   heparin   5,000 Units Subcutaneous Q8H   sodium bicarbonate   1,300 mg Oral BID   sodium chloride  flush  3 mL Intravenous Q12H   sucralfate   1 g Oral TID WC & HS   traZODone   50 mg Oral QHS    cefTRIAXone  (ROCEPHIN )  IV Stopped (12/31/23 1725)   acetaminophen  **OR** acetaminophen , albuterol , ALPRAZolam , alum & mag hydroxide-simeth, ondansetron  **OR** ondansetron  (ZOFRAN ) IV, oxyCODONE , sodium chloride  flush

## 2024-01-01 NOTE — Progress Notes (Signed)
 Progress Note   Patient: [name] FMW:[id number] DOB: October 01, 1960 DOA: 12/30/2023  DOS: the patient was seen and examined on 01/01/2024   Brief hospital course:  63 y.o. female with PMH significant for ESRD on hemodialysis, Noncompliant with dialysis, homeless, COPD, Chronic diastolic CHF, Chronic pain syndrome, history of polysubstance use including cocaine, depression, GERD, alcohol  use, chronic tobacco use presented in the ED with abdominal distention and shortness of breath.     Assessment and Plan:   Acute hypoxic respiratory failure - Secondary to volume overload.  Initially placed on 3 L nasal cannula.  Continues on 3 L this morning.  Wean O2 as tolerated.   Acute exacerbation HFpEF/ESRD volume overload - Noncompliant with HD the outpatient setting.  Homeless, comes to ED for HD periodically.  Nephrology on board and following closely.  Tolerated HD/UF yesterday, -3.3 L.   Ascites - No known history of ascites in the past but noted abdominal distention on presentation.  Pending improvement in hemoglobin after transfusion, will pursue IR paracentesis.  Given no history of ascites in the past we will order diagnostic labs as well.    COPD - Inhalers/nebulizers on board.  Wean O2 as above.  Does not appear to be in acute exacerbation.   Anemia of chronic kidney disease - Hemoglobin 6.5 this morning despite improvement in volume status.  Will order 1 unit PRBCs and recheck hemoglobin.   Severe peripheral arterial disease/peripheral neuropathy - No significant change.  Can follow-up in clinic as needed.  Ordering gabapentin  this morning.   Goals of care - Patient is homeless.  Will consult with Child psychotherapist.  Working closely with case management and nephrology on disposition planning.   Subjective: Patient resting, this morning.  Tolerated dialysis yesterday.  Complaining of peripheral neuropathy as well as abdominal distention and pain.  States it limits her ability to take  deep breaths.  Denies any fevers, worsening shortness of breath, nausea, vomiting.  Physical Exam:  Vitals:   01/01/24 1026 01/01/24 1030 01/01/24 1120 01/01/24 1130  BP: (!) 179/80 (!) 167/95  (!) 170/85  Pulse:  85 74 80  Resp:  18 18 20   Temp: 98.3 F (36.8 C)  98.3 F (36.8 C) 98.5 F (36.9 C)  TempSrc: Oral   Oral  SpO2:  100% 100% 100%  Weight:      Height:        GENERAL:  Alert, pleasant, cachectic HEENT:  EOMI CARDIOVASCULAR:  RRR, no murmurs appreciated RESPIRATORY:  Clear to auscultation, no wheezing, rales, or rhonchi GASTROINTESTINAL:  distended, somewhat tight EXTREMITIES: Thin, trace edema BL LE NEURO:  No new focal deficits appreciated SKIN:  No rashes noted PSYCH:  Appropriate mood and affect    Data Reviewed:  Imaging Studies: CT Angio Chest/Abd/Pel for Dissection W and/or Wo Contrast Result Date: 12/30/2023 EXAM: CT CHEST, ABDOMEN AND PELVIS WITH AND WITHOUT CONTRAST 12/30/2023 03:51:10 PM TECHNIQUE: CT of the chest, abdomen and pelvis was performed with and without the administration of 100 mL of iohexol  (OMNIPAQUE ) 350 MG/ML injection. Multiplanar reformatted images are provided for review. Automated exposure control, iterative reconstruction, and/or weight based adjustment of the mA/kV was utilized to reduce the radiation dose to as low as reasonably achievable. COMPARISON: 10/23/2023 CLINICAL HISTORY: Aortic aneurysm suspected. Shortness of Breath; CT Angio Chest/Abd/Pel for Dissection W and/or Wo Contrast; Aortic aneurysm suspected. Omni 350 ; Pt bib [facility] coming from [facility] patient staying [facility]. Pt has missed dialysis for about a month and states she has been  unable to go due to not having the resources. Pt abdomen distended and pt c/o shortness of breath and lower back pain. FINDINGS: CHEST: MEDIASTINUM AND LYMPH NODES: Mild cardiomegaly. Coronary artery calcifications are noted. The central airways are clear. No mediastinal, hilar or axillary  lymphadenopathy. LUNGS AND PLEURA: No focal consolidation or pulmonary edema. Small left pleural effusion is noted. Minimal right pleural effusion. Minimal bibasilar subsegmental atelectasis. Minimal emphysematous disease. No pneumothorax. ABDOMEN AND PELVIS: LIVER: Probable hepatic cirrhosis. GALLBLADDER AND BILE DUCTS: Gallbladder is unremarkable. No biliary ductal dilatation. SPLEEN: No acute abnormality. PANCREAS: No acute abnormality. ADRENAL GLANDS: No acute abnormality. KIDNEYS, URETERS AND BLADDER: Severe stenosis involving the proximal left renal artery. The right renal artery is not well opacified. No stones in the kidneys or ureters. No hydronephrosis. No perinephric or periureteral stranding. Urinary bladder is unremarkable. GI AND BOWEL: Stomach demonstrates no acute abnormality. Severe narrowing at the origin of the inferior mesenteric artery. There is no bowel obstruction. REPRODUCTIVE ORGANS: No acute abnormality. PERITONEUM AND RETROPERITONEUM: Moderate ascites. No free air. VASCULATURE: Aortic atherosclerosis. Occlusion of proximal portions of both superficial femoral arteries. Moderate stenosis of the origin of the right common iliac artery secondary to calcified plaque. Severe stenosis involving the proximal left renal artery. The right renal artery is not well opacified. Severe narrowing at the origin of the inferior mesenteric artery. ABDOMINAL AND PELVIS LYMPH NODES: No lymphadenopathy. REPRODUCTIVE ORGANS: No acute abnormality. BONES AND SOFT TISSUES: No acute osseous abnormality. No focal soft tissue abnormality. IMPRESSION: 1. No evidence of aortic dissection or aneurysm. 2. Severe stenosis of the proximal left renal artery. Right renal artery not well opacified. 3. Severe narrowing at the origin of the inferior mesenteric artery. 4. Moderate stenosis at the origin of the right common iliac artery due to calcified plaque. 5. Occlusion of the proximal portions of both superficial femoral  arteries. 6. Probable hepatic cirrhosis with moderate ascites. Electronically signed by: Lynwood Seip MD 12/30/2023 04:09 PM EDT RP Workstation: [id number]   DG Chest Port 1 View Result Date: 12/30/2023 CLINICAL DATA:  Questionable sepsis - evaluate for abnormality EXAM: PORTABLE CHEST - 1 VIEW COMPARISON:  11/24/2023 FINDINGS: Biapical pleural thickening. Small right pleural effusion with right basilar atelectasis. No new airspace consolidation or pneumothorax. Mild cardiomegaly. Tortuous aorta with aortic atherosclerosis. No acute fracture or destructive lesions. Multilevel thoracic osteophytosis. IMPRESSION: Mild cardiomegaly with small right pleural effusion. Electronically Signed   By: Rogelia Myers M.D.   On: 12/30/2023 14:28    There are no new results to review at this time.  Previous records (including but not limited to H&P, progress notes, nursing notes, TOC management) were reviewed in assessment of this patient.  Labs: CBC: Recent Labs  Lab 12/30/23 1222 12/30/23 1348 12/30/23 1839 12/31/23 0442 12/31/23 0547 01/01/24 0326  WBC 6.5  --  5.7 6.6  --  6.3  HGB 7.6* 8.5*  7.8* 8.2* 6.8* 6.7* 6.5*  HCT 23.7* 25.0*  23.0* 25.4* 20.9* 20.8* 19.9*  MCV 94.4  --  95.5 95.4  --  94.3  PLT 411*  --  410* 347  --  269   Basic Metabolic Panel: Recent Labs  Lab 12/30/23 1222 12/30/23 1348 12/30/23 1839 12/31/23 0442 01/01/24 0326  NA 132* 136  136  --  131* 132*  K 3.6 3.7  3.7  --  4.2 3.7  CL 103 108  --  104 97*  CO2 13*  --   --  14* 21*  GLUCOSE 90 87  --  105* 98  BUN 56* 55*  --  56* 28*  CREATININE 6.65* 6.90* 6.44* 6.37* 4.22*  CALCIUM  7.9*  --   --  7.6* 7.6*  MG  --   --   --  1.7 1.7  PHOS  --   --   --  7.1*  --    Liver Function Tests: Recent Labs  Lab 12/30/23 1222 12/31/23 0442  AST 13* 15  ALT 10 7  ALKPHOS 90 77  BILITOT 0.4 0.4  PROT 6.2* 5.5*  ALBUMIN  2.6* 2.3*   CBG: Recent Labs  Lab 12/30/23 1225  GLUCAP 74    Scheduled  Meds:  amLODipine   10 mg Oral Daily   atorvastatin   10 mg Oral Daily   calcitRIOL   0.25 mcg Oral Daily   carvedilol   6.25 mg Oral BID WC   Chlorhexidine  Gluconate Cloth  6 each Topical Q0600   docusate sodium   100 mg Oral BID   ferrous sulfate   325 mg Oral Q breakfast   gabapentin   100 mg Oral BID   guaiFENesin   600 mg Oral BID   heparin   5,000 Units Subcutaneous Q8H   sodium bicarbonate   1,300 mg Oral BID   sodium chloride  flush  3 mL Intravenous Q12H   sucralfate   1 g Oral TID WC & HS   traZODone   50 mg Oral QHS   Continuous Infusions:  cefTRIAXone  (ROCEPHIN )  IV Stopped (12/31/23 1725)   PRN Meds:.acetaminophen  **OR** acetaminophen , albuterol , ALPRAZolam , alum & mag hydroxide-simeth, ondansetron  **OR** ondansetron  (ZOFRAN ) IV, oxyCODONE , sodium chloride  flush  Family Communication: None at bedside  Disposition: Status is: Inpatient Remains inpatient appropriate because: ESRD, volume overload, anemia     Time spent: 51 minutes  Length of inpatient stay: 2 days  Author: Carliss LELON Canales, DO 01/01/2024 12:39 PM  For on call review [email or web address].

## 2024-01-02 ENCOUNTER — Inpatient Hospital Stay (HOSPITAL_COMMUNITY)

## 2024-01-02 DIAGNOSIS — F1910 Other psychoactive substance abuse, uncomplicated: Secondary | ICD-10-CM

## 2024-01-02 HISTORY — PX: IR PARACENTESIS: IMG2679

## 2024-01-02 LAB — CBC
HCT: 24.2 % — ABNORMAL LOW (ref 36.0–46.0)
Hemoglobin: 8.2 g/dL — ABNORMAL LOW (ref 12.0–15.0)
MCH: 30.6 pg (ref 26.0–34.0)
MCHC: 33.9 g/dL (ref 30.0–36.0)
MCV: 90.3 fL (ref 80.0–100.0)
Platelets: 290 K/uL (ref 150–400)
RBC: 2.68 MIL/uL — ABNORMAL LOW (ref 3.87–5.11)
RDW: 18.1 % — ABNORMAL HIGH (ref 11.5–15.5)
WBC: 6 K/uL (ref 4.0–10.5)
nRBC: 0 % (ref 0.0–0.2)

## 2024-01-02 LAB — BODY FLUID CELL COUNT WITH DIFFERENTIAL
Eos, Fluid: 0 %
Lymphs, Fluid: 50 %
Monocyte-Macrophage-Serous Fluid: 46 % — ABNORMAL LOW (ref 50–90)
Neutrophil Count, Fluid: 4 % (ref 0–25)
Total Nucleated Cell Count, Fluid: 197 uL (ref 0–1000)

## 2024-01-02 LAB — BASIC METABOLIC PANEL WITH GFR
Anion gap: 15 (ref 5–15)
BUN: 35 mg/dL — ABNORMAL HIGH (ref 8–23)
CO2: 21 mmol/L — ABNORMAL LOW (ref 22–32)
Calcium: 8 mg/dL — ABNORMAL LOW (ref 8.9–10.3)
Chloride: 94 mmol/L — ABNORMAL LOW (ref 98–111)
Creatinine, Ser: 4.49 mg/dL — ABNORMAL HIGH (ref 0.44–1.00)
GFR, Estimated: 10 mL/min — ABNORMAL LOW (ref >60)
Glucose, Bld: 101 mg/dL — ABNORMAL HIGH (ref 70–99)
Potassium: 4 mmol/L (ref 3.5–5.1)
Sodium: 130 mmol/L — ABNORMAL LOW (ref 135–145)

## 2024-01-02 LAB — MAGNESIUM: Magnesium: 1.8 mg/dL (ref 1.7–2.4)

## 2024-01-02 LAB — GLUCOSE, PLEURAL OR PERITONEAL FLUID: Glucose, Fluid: 110 mg/dL

## 2024-01-02 LAB — TYPE AND SCREEN
ABO/RH(D): A POS
Antibody Screen: NEGATIVE
Unit division: 0

## 2024-01-02 LAB — PHOSPHORUS: Phosphorus: 3.6 mg/dL (ref 2.5–4.6)

## 2024-01-02 LAB — BPAM RBC
Blood Product Expiration Date: 202510232359
ISSUE DATE / TIME: 202510161050
Unit Type and Rh: 6200

## 2024-01-02 LAB — PROTEIN, PLEURAL OR PERITONEAL FLUID: Total protein, fluid: <3 g/dL

## 2024-01-02 LAB — AMYLASE, PLEURAL OR PERITONEAL FLUID: Amylase, Fluid: 48 U/L

## 2024-01-02 LAB — GRAM STAIN

## 2024-01-02 LAB — LACTATE DEHYDROGENASE, PLEURAL OR PERITONEAL FLUID: LD, Fluid: 54 U/L — ABNORMAL HIGH (ref 3–23)

## 2024-01-02 MED ORDER — HEPARIN SODIUM (PORCINE) 1000 UNIT/ML IJ SOLN
INTRAMUSCULAR | Status: AC
  Filled 2024-01-02: qty 3

## 2024-01-02 MED ORDER — CHLORHEXIDINE GLUCONATE CLOTH 2 % EX PADS
6.0000 | MEDICATED_PAD | Freq: Every day | CUTANEOUS | Status: DC
  Administered 2024-01-03 – 2024-01-08 (×4): 6 via TOPICAL

## 2024-01-02 MED ORDER — OXYCODONE HCL 5 MG PO TABS
5.0000 mg | ORAL_TABLET | ORAL | Status: DC | PRN
  Administered 2024-01-02 – 2024-01-10 (×27): 5 mg via ORAL
  Filled 2024-01-02 (×26): qty 1

## 2024-01-02 MED ORDER — LIDOCAINE-PRILOCAINE 2.5-2.5 % EX CREA: 1.0000 | TOPICAL_CREAM | CUTANEOUS | Status: DC | PRN

## 2024-01-02 MED ORDER — NEPRO/CARBSTEADY PO LIQD: 237.0000 mL | ORAL | Status: DC | PRN

## 2024-01-02 MED ORDER — HEPARIN SODIUM (PORCINE) 1000 UNIT/ML DIALYSIS: 1000.0000 [IU] | INTRAMUSCULAR | Status: DC | PRN

## 2024-01-02 MED ORDER — LIDOCAINE-EPINEPHRINE 1 %-1:100000 IJ SOLN
20.0000 mL | Freq: Once | INTRAMUSCULAR | Status: AC
  Administered 2024-01-02: 10 mL via INTRADERMAL

## 2024-01-02 MED ORDER — LIDOCAINE HCL (PF) 1 % IJ SOLN: 5.0000 mL | INTRAMUSCULAR | Status: DC | PRN

## 2024-01-02 MED ORDER — BISACODYL 5 MG PO TBEC
5.0000 mg | DELAYED_RELEASE_TABLET | Freq: Every day | ORAL | Status: DC | PRN
Start: 2024-01-02 — End: 2024-01-17
  Administered 2024-01-06: 5 mg via ORAL
  Filled 2024-01-02: qty 1

## 2024-01-02 MED ORDER — LIDOCAINE-EPINEPHRINE 1 %-1:100000 IJ SOLN
INTRAMUSCULAR | Status: AC
  Filled 2024-01-02: qty 1

## 2024-01-02 MED ORDER — HEPARIN SODIUM (PORCINE) 1000 UNIT/ML DIALYSIS: 2000.0000 [IU] | Freq: Once | INTRAMUSCULAR | Status: DC

## 2024-01-02 MED ORDER — PENTAFLUOROPROP-TETRAFLUOROETH EX AERO: 1.0000 | INHALATION_SPRAY | CUTANEOUS | Status: DC | PRN

## 2024-01-02 MED ORDER — ALTEPLASE 2 MG IJ SOLR: 2.0000 mg | Freq: Once | INTRAMUSCULAR | Status: DC | PRN

## 2024-01-02 MED ORDER — ANTICOAGULANT SODIUM CITRATE 4% (200MG/5ML) IV SOLN: 5.0000 mL | Status: DC | PRN

## 2024-01-02 MED ORDER — POLYETHYLENE GLYCOL 3350 17 G PO PACK
17.0000 g | PACK | Freq: Every day | ORAL | Status: DC
  Administered 2024-01-02 – 2024-01-06 (×5): 17 g via ORAL
  Filled 2024-01-02 (×5): qty 1

## 2024-01-02 MED ORDER — GABAPENTIN 100 MG PO CAPS
100.0000 mg | ORAL_CAPSULE | Freq: Three times a day (TID) | ORAL | Status: DC
  Administered 2024-01-02 – 2024-01-17 (×41): 100 mg via ORAL
  Filled 2024-01-02 (×41): qty 1

## 2024-01-02 NOTE — Procedures (Signed)
 PROCEDURE SUMMARY:  Successful image-guided paracentesis from the left abdomen.  Yielded 2.8 liters of clear, straw-colored peritoneal fluid.  No immediate complications.  EBL: zero Patient tolerated well.   Specimen was sent for labs.  Please see imaging section of Epic for full dictation.  Remo Kirschenmann A Mathius Birkeland PA-C 01/02/2024 11:03 AM

## 2024-01-02 NOTE — Progress Notes (Signed)
 [facility] Progress Note  Subjective:  No c/o's today  Vitals:   01/01/24 2329 01/02/24 0310 01/02/24 0727 01/02/24 1153  BP: (!) 143/99 (!) 151/127 (!) 101/91 (!) 162/76  Pulse: 84 82 81 80  Resp: 20 17 16 20   Temp: 98.4 F (36.9 C) 97.9 F (36.6 C) 98.4 F (36.9 C) 98.2 F (36.8 C)  TempSrc: Oral Oral Oral Oral  SpO2: 98% 95% 100% 96%  Weight:      Height:        Exam: Gen alert, on RA today No jvd  Chest clear bilat to bases RRR no MRG Abd soft ntnd no mass, 3+ tight ascites Ext no sig LE edema Neuro is alert, Ox 3 , nf    RUA AVF + bruit   Home bp meds: Norvasc  10 mg daily Coreg  6.25 mg twice daily    OP HD: no OP HD unit  Pt is basically homeless and comes to hospital for dialysis every 3-4 wks or so       Assessment/ Plan: SOB/ volume overload: no edema per CXR, though pt is vol overloaded. Severe ascites may be contributing to her shortness of breath. Got 3.5 L off w/ HD Wed. Improving. BP's still high, room for more vol removal.  Ascites: believe this is new, patient states she has not had this swelling in the past.  HD usually will not fix significant ascites, have d/w pmd.  ESRD: no OP HD unit. Comes to hospital for HD when feeling bad. Had HD here Wed. Next HD today/tonight.  HTN/ vol: bp's high, getting norvasc / coreg  here as at home. Cont to lower vol w/ HD as tolerated.  Anemia of esrd: Hb low in 6-8 range here. Sp PRBCs yesterday. Hb 8-9 now. Don't think that esa's will be helpful given her infrequent visits to renal caregivers. Tsat 30% on 10/15, no IV fe needed.     Myer Fret MD  [facility] 01/02/2024, 4:15 PM  Recent Labs  Lab 12/30/23 1222 12/30/23 1348 12/31/23 0442 12/31/23 0547 01/01/24 0326 01/01/24 1449 01/02/24 0313  HGB 7.6*   < > 6.8*   < > 6.5* 8.1* 8.2*  ALBUMIN  2.6*  --  2.3*  --   --   --   --   CALCIUM  7.9*  --  7.6*  --  7.6*  --  8.0*  PHOS  --   --  7.1*  --   --   --  3.6  CREATININE 6.65*   < > 6.37*   --  4.22*  --  4.49*  K 3.6   < > 4.2  --  3.7  --  4.0   < > = values in this interval not displayed.   Recent Labs  Lab 12/31/23 0442  IRON  55  TIBC 186*  FERRITIN 256   Inpatient medications:  amLODipine   10 mg Oral Daily   atorvastatin   10 mg Oral Daily   calcitRIOL   0.25 mcg Oral Daily   carvedilol   6.25 mg Oral BID WC   Chlorhexidine  Gluconate Cloth  6 each Topical Q0600   docusate sodium   100 mg Oral BID   ferrous sulfate   325 mg Oral Q breakfast   gabapentin   100 mg Oral TID   guaiFENesin   600 mg Oral BID   heparin   5,000 Units Subcutaneous Q8H   polyethylene glycol  17 g Oral Daily   sodium bicarbonate   1,300 mg Oral BID   sodium chloride  flush  3  mL Intravenous Q12H   sucralfate   1 g Oral TID WC & HS   traZODone   50 mg Oral QHS    cefTRIAXone  (ROCEPHIN )  IV 1 g (01/01/24 1750)   acetaminophen  **OR** acetaminophen , albuterol , ALPRAZolam , alum & mag hydroxide-simeth, bisacodyl , ondansetron  **OR** ondansetron  (ZOFRAN ) IV, oxyCODONE , sodium chloride  flush

## 2024-01-02 NOTE — Progress Notes (Signed)
 Mobility Specialist Progress Note:    01/02/24 1430  Mobility  Activity Ambulated with assistance  Level of Assistance Contact guard assist, steadying assist  Assistive Device Cane  Distance Ambulated (ft) 50 ft  Activity Response Tolerated well  Mobility Referral Yes  Mobility visit 1 Mobility  Mobility Specialist Start Time (ACUTE ONLY) 1430  Mobility Specialist Stop Time (ACUTE ONLY) 1445  Mobility Specialist Time Calculation (min) (ACUTE ONLY) 15 min   Pt received in bed, agreeable to mobility session. MinG with Cane required for safety. Ambulated in room. Pt limited by back and LE pain. RN notified. Returned pt to bed, all needs met, call bell in reach.  Ephraim Reichel Mobility Specialist Please contact via Special educational needs teacher or  Rehab office at [phone]

## 2024-01-02 NOTE — Progress Notes (Signed)
 Progress Note   Patient: [name] FMW:[id number] DOB: 06/27/1960 DOA: 12/30/2023  DOS: the patient was seen and examined on 01/02/2024   Brief hospital course:  63 y.o. female with PMH significant for ESRD on hemodialysis, Noncompliant with dialysis, homeless, COPD, Chronic diastolic CHF, Chronic pain syndrome, history of polysubstance use including cocaine, depression, GERD, alcohol  use, chronic tobacco use presented in the ED with abdominal distention and shortness of breath.     Assessment and Plan:   Acute hypoxic respiratory failure - Secondary to volume overload.  Initially placed on 3 L nasal cannula.  Able to be weaned down to room air this morning.    Acute exacerbation HFpEF/ESRD/volume overload - Noncompliant with HD the outpatient setting.  Homeless, comes to ED for HD periodically.  Nephrology on board and following closely.  Tolerated initial HD/UF removing 3.3 L.  Possible HD today.   Ascites - No known history of ascites in the past but noted abdominal distention on presentation.  Given improvement in hemoglobin after transfusion, will pursue IR paracentesis today.  Given no history of ascites in the past we will order diagnostic labs as well.     COPD - Inhalers/nebulizers on board.  Wean O2 as above.  Does not appear to be in acute exacerbation.   Anemia of chronic kidney disease - S/p 1 unit PRBCs yesterday 10/17.  Hemoglobin improved to 8.2 this morning.  No active bleeding appreciated.     Severe peripheral arterial disease/peripheral neuropathy - Pain not well-controlled.  Initiated gabapentin  100 mg twice daily yesterday.  Will increase to gabapentin  3 times daily 100 mg.  Will also change oxycodone  IR 5 mg from every 6 hours to every 4 hours as needed.  Possible urinary tract infection (POA) - UA noting LE, WBCs, bacteria.  Preliminary urine cultures noting possible group B strep.  Currently on ceftriaxone .  Polysubstance abuse - Noted positive tox  screen for cocaine on presentation.  Encourage cessation.   Goals of care - Patient is homeless.  Will consult with Child psychotherapist.  Working closely with case management and nephrology on disposition planning.   Subjective: Patient seen up in bed this morning, no longer on supplemental oxygen .  Still complaining of abdominal distention as well as lower extremity neuropathic pain.  Has concerns about her medication regiment especially upon discharge.  Denies any fever, shortness of breath, chest pain, nausea, vomiting.  Physical Exam:  Vitals:   01/01/24 2329 01/02/24 0310 01/02/24 0727 01/02/24 1153  BP: (!) 143/99 (!) 151/127 (!) 101/91 (!) 162/76  Pulse: 84 82 81 80  Resp: 20 17 16 20   Temp: 98.4 F (36.9 C) 97.9 F (36.6 C) 98.4 F (36.9 C) 98.2 F (36.8 C)  TempSrc: Oral Oral Oral Oral  SpO2: 98% 95% 100% 96%  Weight:      Height:        GENERAL:  Alert, pleasant, cachectic HEENT:  EOMI CARDIOVASCULAR:  RRR, no murmurs appreciated RESPIRATORY:  Clear to auscultation, no wheezing, rales, or rhonchi GASTROINTESTINAL:  distended, somewhat tight EXTREMITIES: Thin, trace edema BL LE NEURO:  No new focal deficits appreciated SKIN:  No rashes noted PSYCH:  Appropriate mood and affect, anxious   Data Reviewed:  Imaging Studies: CT Angio Chest/Abd/Pel for Dissection W and/or Wo Contrast Result Date: 12/30/2023 EXAM: CT CHEST, ABDOMEN AND PELVIS WITH AND WITHOUT CONTRAST 12/30/2023 03:51:10 PM TECHNIQUE: CT of the chest, abdomen and pelvis was performed with and without the administration of 100 mL of iohexol  (OMNIPAQUE )  350 MG/ML injection. Multiplanar reformatted images are provided for review. Automated exposure control, iterative reconstruction, and/or weight based adjustment of the mA/kV was utilized to reduce the radiation dose to as low as reasonably achievable. COMPARISON: 10/23/2023 CLINICAL HISTORY: Aortic aneurysm suspected. Shortness of Breath; CT Angio Chest/Abd/Pel  for Dissection W and/or Wo Contrast; Aortic aneurysm suspected. Omni 350 ; Pt bib [facility] patient staying [facility]. Pt has missed dialysis for about a month and states she has been unable to go due to not having the resources. Pt abdomen distended and pt c/o shortness of breath and lower back pain. FINDINGS: CHEST: MEDIASTINUM AND LYMPH NODES: Mild cardiomegaly. Coronary artery calcifications are noted. The central airways are clear. No mediastinal, hilar or axillary lymphadenopathy. LUNGS AND PLEURA: No focal consolidation or pulmonary edema. Small left pleural effusion is noted. Minimal right pleural effusion. Minimal bibasilar subsegmental atelectasis. Minimal emphysematous disease. No pneumothorax. ABDOMEN AND PELVIS: LIVER: Probable hepatic cirrhosis. GALLBLADDER AND BILE DUCTS: Gallbladder is unremarkable. No biliary ductal dilatation. SPLEEN: No acute abnormality. PANCREAS: No acute abnormality. ADRENAL GLANDS: No acute abnormality. KIDNEYS, URETERS AND BLADDER: Severe stenosis involving the proximal left renal artery. The right renal artery is not well opacified. No stones in the kidneys or ureters. No hydronephrosis. No perinephric or periureteral stranding. Urinary bladder is unremarkable. GI AND BOWEL: Stomach demonstrates no acute abnormality. Severe narrowing at the origin of the inferior mesenteric artery. There is no bowel obstruction. REPRODUCTIVE ORGANS: No acute abnormality. PERITONEUM AND RETROPERITONEUM: Moderate ascites. No free air. VASCULATURE: Aortic atherosclerosis. Occlusion of proximal portions of both superficial femoral arteries. Moderate stenosis of the origin of the right common iliac artery secondary to calcified plaque. Severe stenosis involving the proximal left renal artery. The right renal artery is not well opacified. Severe narrowing at the origin of the inferior mesenteric artery. ABDOMINAL AND PELVIS LYMPH NODES: No lymphadenopathy. REPRODUCTIVE ORGANS: No acute  abnormality. BONES AND SOFT TISSUES: No acute osseous abnormality. No focal soft tissue abnormality. IMPRESSION: 1. No evidence of aortic dissection or aneurysm. 2. Severe stenosis of the proximal left renal artery. Right renal artery not well opacified. 3. Severe narrowing at the origin of the inferior mesenteric artery. 4. Moderate stenosis at the origin of the right common iliac artery due to calcified plaque. 5. Occlusion of the proximal portions of both superficial femoral arteries. 6. Probable hepatic cirrhosis with moderate ascites. Electronically signed by: Lynwood Seip MD 12/30/2023 04:09 PM EDT RP Workstation: [id number]   DG Chest Port 1 View Result Date: 12/30/2023 CLINICAL DATA:  Questionable sepsis - evaluate for abnormality EXAM: PORTABLE CHEST - 1 VIEW COMPARISON:  11/24/2023 FINDINGS: Biapical pleural thickening. Small right pleural effusion with right basilar atelectasis. No new airspace consolidation or pneumothorax. Mild cardiomegaly. Tortuous aorta with aortic atherosclerosis. No acute fracture or destructive lesions. Multilevel thoracic osteophytosis. IMPRESSION: Mild cardiomegaly with small right pleural effusion. Electronically Signed   By: Rogelia Myers M.D.   On: 12/30/2023 14:28    Results are pending, will review when available.  Previous records (including but not limited to H&P, progress notes, nursing notes, TOC management) were reviewed in assessment of this patient.  Labs: CBC: Recent Labs  Lab 12/30/23 1222 12/30/23 1348 12/30/23 1839 12/31/23 0442 12/31/23 0547 01/01/24 0326 01/01/24 1449 01/02/24 0313  WBC 6.5  --  5.7 6.6  --  6.3  --  6.0  HGB 7.6*   < > 8.2* 6.8* 6.7* 6.5* 8.1* 8.2*  HCT 23.7*   < > 25.4* 20.9*  20.8* 19.9* 24.3* 24.2*  MCV 94.4  --  95.5 95.4  --  94.3  --  90.3  PLT 411*  --  410* 347  --  269  --  290   < > = values in this interval not displayed.   Basic Metabolic Panel: Recent Labs  Lab 12/30/23 1222 12/30/23 1348  12/30/23 1839 12/31/23 0442 01/01/24 0326 01/02/24 0313  NA 132* 136  136  --  131* 132* 130*  K 3.6 3.7  3.7  --  4.2 3.7 4.0  CL 103 108  --  104 97* 94*  CO2 13*  --   --  14* 21* 21*  GLUCOSE 90 87  --  105* 98 101*  BUN 56* 55*  --  56* 28* 35*  CREATININE 6.65* 6.90* 6.44* 6.37* 4.22* 4.49*  CALCIUM  7.9*  --   --  7.6* 7.6* 8.0*  MG  --   --   --  1.7 1.7 1.8  PHOS  --   --   --  7.1*  --  3.6   Liver Function Tests: Recent Labs  Lab 12/30/23 1222 12/31/23 0442  AST 13* 15  ALT 10 7  ALKPHOS 90 77  BILITOT 0.4 0.4  PROT 6.2* 5.5*  ALBUMIN  2.6* 2.3*   CBG: Recent Labs  Lab 12/30/23 1225  GLUCAP 74    Scheduled Meds:  amLODipine   10 mg Oral Daily   atorvastatin   10 mg Oral Daily   calcitRIOL   0.25 mcg Oral Daily   carvedilol   6.25 mg Oral BID WC   Chlorhexidine  Gluconate Cloth  6 each Topical Q0600   docusate sodium   100 mg Oral BID   ferrous sulfate   325 mg Oral Q breakfast   gabapentin   100 mg Oral BID   guaiFENesin   600 mg Oral BID   heparin   5,000 Units Subcutaneous Q8H   sodium bicarbonate   1,300 mg Oral BID   sodium chloride  flush  3 mL Intravenous Q12H   sucralfate   1 g Oral TID WC & HS   traZODone   50 mg Oral QHS   Continuous Infusions:  cefTRIAXone  (ROCEPHIN )  IV 1 g (01/01/24 1750)   PRN Meds:.acetaminophen  **OR** acetaminophen , albuterol , ALPRAZolam , alum & mag hydroxide-simeth, ondansetron  **OR** ondansetron  (ZOFRAN ) IV, oxyCODONE , sodium chloride  flush  Family Communication: None at bedside  Disposition: Status is: Inpatient Remains inpatient appropriate because: HFrEF, ESRD, volume overload, ascites     Time spent: 51 minutes  Length of inpatient stay: 3 days  Author: Carliss LELON Canales, DO 01/02/2024 1:06 PM  For on call review [email or web address].

## 2024-01-03 DIAGNOSIS — F32A Depression, unspecified: Secondary | ICD-10-CM

## 2024-01-03 DIAGNOSIS — F419 Anxiety disorder, unspecified: Secondary | ICD-10-CM

## 2024-01-03 LAB — COMPREHENSIVE METABOLIC PANEL WITH GFR
ALT: 15 U/L (ref 0–44)
AST: 32 U/L (ref 15–41)
Albumin: 2.2 g/dL — ABNORMAL LOW (ref 3.5–5.0)
Alkaline Phosphatase: 73 U/L (ref 38–126)
Anion gap: 11 (ref 5–15)
BUN: 17 mg/dL (ref 8–23)
CO2: 24 mmol/L (ref 22–32)
Calcium: 8 mg/dL — ABNORMAL LOW (ref 8.9–10.3)
Chloride: 94 mmol/L — ABNORMAL LOW (ref 98–111)
Creatinine, Ser: 2.81 mg/dL — ABNORMAL HIGH (ref 0.44–1.00)
GFR, Estimated: 18 mL/min — ABNORMAL LOW (ref >60)
Glucose, Bld: 83 mg/dL (ref 70–99)
Potassium: 4.2 mmol/L (ref 3.5–5.1)
Sodium: 129 mmol/L — ABNORMAL LOW (ref 135–145)
Total Bilirubin: 0.5 mg/dL (ref 0.0–1.2)
Total Protein: 5.6 g/dL — ABNORMAL LOW (ref 6.5–8.1)

## 2024-01-03 LAB — CBC
HCT: 23 % — ABNORMAL LOW (ref 36.0–46.0)
Hemoglobin: 7.6 g/dL — ABNORMAL LOW (ref 12.0–15.0)
MCH: 30.8 pg (ref 26.0–34.0)
MCHC: 33 g/dL (ref 30.0–36.0)
MCV: 93.1 fL (ref 80.0–100.0)
Platelets: 246 K/uL (ref 150–400)
RBC: 2.47 MIL/uL — ABNORMAL LOW (ref 3.87–5.11)
RDW: 17.1 % — ABNORMAL HIGH (ref 11.5–15.5)
WBC: 5.3 K/uL (ref 4.0–10.5)
nRBC: 0 % (ref 0.0–0.2)

## 2024-01-03 LAB — MAGNESIUM: Magnesium: 1.7 mg/dL (ref 1.7–2.4)

## 2024-01-03 LAB — PHOSPHORUS: Phosphorus: 3.1 mg/dL (ref 2.5–4.6)

## 2024-01-03 MED ORDER — PANTOPRAZOLE SODIUM 40 MG PO TBEC
40.0000 mg | DELAYED_RELEASE_TABLET | Freq: Two times a day (BID) | ORAL | Status: DC
  Administered 2024-01-03 – 2024-01-17 (×28): 40 mg via ORAL
  Filled 2024-01-03 (×30): qty 1

## 2024-01-03 NOTE — Progress Notes (Signed)
 Progress Note   Patient: [name] FMW:[id number] DOB: 1960/10/04 DOA: 12/30/2023  DOS: the patient was seen and examined on 01/03/2024   Brief hospital course:  63 y.o. female with PMH significant for ESRD on hemodialysis, Noncompliant with dialysis, homeless, COPD, Chronic diastolic CHF, Chronic pain syndrome, history of polysubstance use including cocaine, depression, GERD, alcohol  use, chronic tobacco use presented in the ED with abdominal distention and shortness of breath.     Assessment and Plan:   Acute hypoxic respiratory failure - Secondary to volume overload.  Initially placed on 3 L nasal cannula.  Able to be weaned down to room air.    Acute exacerbation HFpEF/ESRD/volume overload - Noncompliant with HD the outpatient setting.  Homeless, comes to ED for HD periodically.  Nephrology on board and following closely.  Tolerated initial HD/UF removing 3.3 L.  Another 3L UF removed yesterday.   Ascites - No known history of ascites in the past but noted abdominal distention on presentation.  S/p paracentesis 10/17 with 2.8 L removed.  Studies not suggestive of infection.   COPD - Inhalers/nebulizers on board.  Wean O2 as above.  Does not appear to be in acute exacerbation.   Anemia of chronic kidney disease - S/p 1 unit PRBCs yesterday 10/17.  Hemoglobin stable.  No active bleeding appreciated.     Severe peripheral arterial disease/peripheral neuropathy - Pain not well-controlled.  Continues on gabapentin  3 times daily 100 mg.  oxycodone  IR 5 mg from every 4 hours as needed.   Possible urinary tract infection (POA) - UA noting LE, WBCs, bacteria.  Preliminary urine cultures noting possible group B strep.  Currently on ceftriaxone .   Polysubstance abuse - Noted positive tox screen for cocaine on presentation.  Encourage cessation.   Goals of care - Patient is homeless.  Will consult with Child psychotherapist.  Working closely with case management and nephrology on  disposition planning.  Pt is very anxious about her disposition and medications.   Subjective: Patient feeling improved this morning.  Removed 3 L dialysis and 2.8 L of paracentesis.  Abdominal pain appears resolved.  Complaining of reflux/burning this morning.  Denies any fever, chills, chest pain nausea, vomiting, abdominal pain.  Very anxious about her discharge disposition and medications.  Does not have good follow-up.  Physical Exam:  Vitals:   01/03/24 0315 01/03/24 0350 01/03/24 0809 01/03/24 1221  BP: (!) 158/86 (!) 153/70 (!) 172/71 (!) 161/80  Pulse: 76 73 75 80  Resp: (!) 22 18 15 16   Temp: 98.5 F (36.9 C) 98.4 F (36.9 C) 98.7 F (37.1 C) 97.7 F (36.5 C)  TempSrc: Oral Oral Oral Oral  SpO2: 100% 100%  96%  Weight:      Height:        GENERAL:  Alert, pleasant, cachectic HEENT:  EOMI CARDIOVASCULAR:  RRR, no murmurs appreciated RESPIRATORY:  Clear to auscultation, no wheezing, rales, or rhonchi GASTROINTESTINAL: Nondistended EXTREMITIES: Thin, trace edema BL LE NEURO:  No new focal deficits appreciated SKIN:  No rashes noted PSYCH:  Appropriate mood and affect, anxious   Data Reviewed:  Imaging Studies: IR Paracentesis Result Date: 01/02/2024 INDICATION: 63 year old female with history of HFpEF/ESRD with volume overload, admitted for acute respiratory failure, and diagnosed with new onset ascites. IR was requested for diagnostic and therapeutic paracentesis. EXAM: ULTRASOUND GUIDED DIAGNOSTIC AND THERAPEUTIC PARACENTESIS MEDICATIONS: 5 cc of 1% lidocaine  with epi. COMPLICATIONS: None immediate. PROCEDURE: Informed written consent was obtained from the patient after a discussion of the  risks, benefits and alternatives to treatment. A timeout was performed prior to the initiation of the procedure. Initial ultrasound scanning demonstrates a large amount of ascites within the right lower abdominal quadrant. The right lower abdomen was prepped and draped in the usual  sterile fashion. 1% lidocaine  was used for local anesthesia. Following this, a 19 gauge, 7-cm, Yueh catheter was introduced. An ultrasound image was saved for documentation purposes. The paracentesis was performed. The catheter was removed and a dressing was applied. The patient tolerated the procedure well without immediate post procedural complication. FINDINGS: A total of approximately 2.8 L of clear, straw-colored peritoneal fluid was removed. Samples were sent to the laboratory as requested by the clinical team. IMPRESSION: Successful ultrasound-guided paracentesis yielding 2.8 liters of peritoneal fluid. Procedure performed by Carlin Griffon, PA-C Electronically Signed   By: Cordella Banner   On: 01/02/2024 16:56   CT Angio Chest/Abd/Pel for Dissection W and/or Wo Contrast Result Date: 12/30/2023 EXAM: CT CHEST, ABDOMEN AND PELVIS WITH AND WITHOUT CONTRAST 12/30/2023 03:51:10 PM TECHNIQUE: CT of the chest, abdomen and pelvis was performed with and without the administration of 100 mL of iohexol  (OMNIPAQUE ) 350 MG/ML injection. Multiplanar reformatted images are provided for review. Automated exposure control, iterative reconstruction, and/or weight based adjustment of the mA/kV was utilized to reduce the radiation dose to as low as reasonably achievable. COMPARISON: 10/23/2023 CLINICAL HISTORY: Aortic aneurysm suspected. Shortness of Breath; CT Angio Chest/Abd/Pel for Dissection W and/or Wo Contrast; Aortic aneurysm suspected. Omni 350 ; Pt bib [facility]. Pt has missed dialysis for about a month and states she has been unable to go due to not having the resources. Pt abdomen distended and pt c/o shortness of breath and lower back pain. FINDINGS: CHEST: MEDIASTINUM AND LYMPH NODES: Mild cardiomegaly. Coronary artery calcifications are noted. The central airways are clear. No mediastinal, hilar or axillary lymphadenopathy. LUNGS AND PLEURA: No focal consolidation or  pulmonary edema. Small left pleural effusion is noted. Minimal right pleural effusion. Minimal bibasilar subsegmental atelectasis. Minimal emphysematous disease. No pneumothorax. ABDOMEN AND PELVIS: LIVER: Probable hepatic cirrhosis. GALLBLADDER AND BILE DUCTS: Gallbladder is unremarkable. No biliary ductal dilatation. SPLEEN: No acute abnormality. PANCREAS: No acute abnormality. ADRENAL GLANDS: No acute abnormality. KIDNEYS, URETERS AND BLADDER: Severe stenosis involving the proximal left renal artery. The right renal artery is not well opacified. No stones in the kidneys or ureters. No hydronephrosis. No perinephric or periureteral stranding. Urinary bladder is unremarkable. GI AND BOWEL: Stomach demonstrates no acute abnormality. Severe narrowing at the origin of the inferior mesenteric artery. There is no bowel obstruction. REPRODUCTIVE ORGANS: No acute abnormality. PERITONEUM AND RETROPERITONEUM: Moderate ascites. No free air. VASCULATURE: Aortic atherosclerosis. Occlusion of proximal portions of both superficial femoral arteries. Moderate stenosis of the origin of the right common iliac artery secondary to calcified plaque. Severe stenosis involving the proximal left renal artery. The right renal artery is not well opacified. Severe narrowing at the origin of the inferior mesenteric artery. ABDOMINAL AND PELVIS LYMPH NODES: No lymphadenopathy. REPRODUCTIVE ORGANS: No acute abnormality. BONES AND SOFT TISSUES: No acute osseous abnormality. No focal soft tissue abnormality. IMPRESSION: 1. No evidence of aortic dissection or aneurysm. 2. Severe stenosis of the proximal left renal artery. Right renal artery not well opacified. 3. Severe narrowing at the origin of the inferior mesenteric artery. 4. Moderate stenosis at the origin of the right common iliac artery due to calcified plaque. 5. Occlusion of the proximal portions of both superficial femoral arteries.  6. Probable hepatic cirrhosis with moderate ascites.  Electronically signed by: Lynwood Seip MD 12/30/2023 04:09 PM EDT RP Workstation: [id number]   DG Chest Port 1 View Result Date: 12/30/2023 CLINICAL DATA:  Questionable sepsis - evaluate for abnormality EXAM: PORTABLE CHEST - 1 VIEW COMPARISON:  11/24/2023 FINDINGS: Biapical pleural thickening. Small right pleural effusion with right basilar atelectasis. No new airspace consolidation or pneumothorax. Mild cardiomegaly. Tortuous aorta with aortic atherosclerosis. No acute fracture or destructive lesions. Multilevel thoracic osteophytosis. IMPRESSION: Mild cardiomegaly with small right pleural effusion. Electronically Signed   By: Rogelia Myers M.D.   On: 12/30/2023 14:28    There are no new results to review at this time.  Previous records (including but not limited to H&P, progress notes, nursing notes, TOC management) were reviewed in assessment of this patient.  Labs: CBC: Recent Labs  Lab 12/30/23 1839 12/31/23 0442 12/31/23 0547 01/01/24 0326 01/01/24 1449 01/02/24 0313 01/03/24 0840  WBC 5.7 6.6  --  6.3  --  6.0 5.3  HGB 8.2* 6.8* 6.7* 6.5* 8.1* 8.2* 7.6*  HCT 25.4* 20.9* 20.8* 19.9* 24.3* 24.2* 23.0*  MCV 95.5 95.4  --  94.3  --  90.3 93.1  PLT 410* 347  --  269  --  290 246   Basic Metabolic Panel: Recent Labs  Lab 12/30/23 1222 12/30/23 1348 12/30/23 1839 12/31/23 0442 01/01/24 0326 01/02/24 0313 01/03/24 0840  NA 132* 136  136  --  131* 132* 130* 129*  K 3.6 3.7  3.7  --  4.2 3.7 4.0 4.2  CL 103 108  --  104 97* 94* 94*  CO2 13*  --   --  14* 21* 21* 24  GLUCOSE 90 87  --  105* 98 101* 83  BUN 56* 55*  --  56* 28* 35* 17  CREATININE 6.65* 6.90* 6.44* 6.37* 4.22* 4.49* 2.81*  CALCIUM  7.9*  --   --  7.6* 7.6* 8.0* 8.0*  MG  --   --   --  1.7 1.7 1.8 1.7  PHOS  --   --   --  7.1*  --  3.6 3.1   Liver Function Tests: Recent Labs  Lab 12/30/23 1222 12/31/23 0442 01/03/24 0840  AST 13* 15 32  ALT 10 7 15   ALKPHOS 90 77 73  BILITOT 0.4 0.4 0.5  PROT  6.2* 5.5* 5.6*  ALBUMIN  2.6* 2.3* 2.2*   CBG: Recent Labs  Lab 12/30/23 1225  GLUCAP 74    Scheduled Meds:  amLODipine   10 mg Oral Daily   atorvastatin   10 mg Oral Daily   calcitRIOL   0.25 mcg Oral Daily   carvedilol   6.25 mg Oral BID WC   Chlorhexidine  Gluconate Cloth  6 each Topical Q0600   Chlorhexidine  Gluconate Cloth  6 each Topical Q0600   docusate sodium   100 mg Oral BID   ferrous sulfate   325 mg Oral Q breakfast   gabapentin   100 mg Oral TID   guaiFENesin   600 mg Oral BID   heparin   5,000 Units Subcutaneous Q8H   polyethylene glycol  17 g Oral Daily   sodium bicarbonate   1,300 mg Oral BID   sodium chloride  flush  3 mL Intravenous Q12H   sucralfate   1 g Oral TID WC & HS   traZODone   50 mg Oral QHS   Continuous Infusions:  cefTRIAXone  (ROCEPHIN )  IV 1 g (01/02/24 1750)   PRN Meds:.acetaminophen  **OR** acetaminophen , albuterol , ALPRAZolam , alum & mag hydroxide-simeth, bisacodyl , ondansetron  **OR**  ondansetron  (ZOFRAN ) IV, oxyCODONE , sodium chloride  flush  Family Communication: None at bedside  Disposition: Status is: Inpatient Remains inpatient appropriate because:, ESRD, ascites     Time spent: 36 minutes  Length of inpatient stay: 4 days  Author: Carliss LELON Canales, DO 01/03/2024 3:16 PM  For on call review [email or web address].

## 2024-01-03 NOTE — Progress Notes (Signed)
 [facility] Progress Note  Subjective:  No c/o's today, feels much better IR did 2.8 L paracentesis yesterday Had another HD yest w/ 3 L off  Vitals:   01/03/24 0315 01/03/24 0350 01/03/24 0809 01/03/24 1221  BP: (!) 158/86 (!) 153/70 (!) 172/71 (!) 161/80  Pulse: 76 73 75 80  Resp: (!) 22 18 15 16   Temp: 98.5 F (36.9 C) 98.4 F (36.9 C) 98.7 F (37.1 C) 97.7 F (36.5 C)  TempSrc: Oral Oral Oral Oral  SpO2: 100% 100%  96%  Weight:      Height:        Exam: Gen alert, on RA, looks much better No jvd  Chest clear bilat to bases RRR no MRG Abd soft ntnd no mass, 1-2+ ascites, not tight Ext no sig LE edema Neuro is alert, Ox 3 , nf    RUA AVF + bruit   Home bp meds: Norvasc  10 mg daily Coreg  6.25 mg twice daily    OP HD: no OP HD unit  Pt is basically homeless and comes to hospital for dialysis every 3-4 wks or so       Assessment/ Plan: SOB/ volume overload: no edema per CXR, though pt is vol overloaded. Severe ascites perhaps contributed to SOB initially. Is now SP HD x 2 w/ 6.5 L off total, also 2.8 L LVP. Looks much better.  Ascites: patient states she has not had this swelling in the past.  Had LVP yest w/ IR for 2.8 L.  ESRD: no OP HD unit. Comes to hospital for HD when feeling bad. Had HD here Wed + Friday. Next HD Monday.  HTN/ vol: bp's high, getting norvasc / coreg  here as at home. Cont to lower vol w/ HD as tolerated.  Anemia of esrd: Hb low in 6-8 range here. Sp PRBCs yesterday. Hb 8-9 now. Don't think that esa's will be helpful given her infrequent visits to renal caregivers. Tsat 30% on 10/15, no IV fe needed.     Myer Fret MD  [facility] 01/03/2024, 1:21 PM  Recent Labs  Lab 12/31/23 0442 12/31/23 0547 01/02/24 0313 01/03/24 0840  HGB 6.8*   < > 8.2* 7.6*  ALBUMIN  2.3*  --   --  2.2*  CALCIUM  7.6*   < > 8.0* 8.0*  PHOS 7.1*  --  3.6 3.1  CREATININE 6.37*   < > 4.49* 2.81*  K 4.2   < > 4.0 4.2   < > = values in this interval not  displayed.   Recent Labs  Lab 12/31/23 0442  IRON  55  TIBC 186*  FERRITIN 256   Inpatient medications:  amLODipine   10 mg Oral Daily   atorvastatin   10 mg Oral Daily   calcitRIOL   0.25 mcg Oral Daily   carvedilol   6.25 mg Oral BID WC   Chlorhexidine  Gluconate Cloth  6 each Topical Q0600   Chlorhexidine  Gluconate Cloth  6 each Topical Q0600   docusate sodium   100 mg Oral BID   ferrous sulfate   325 mg Oral Q breakfast   gabapentin   100 mg Oral TID   guaiFENesin   600 mg Oral BID   heparin   5,000 Units Subcutaneous Q8H   polyethylene glycol  17 g Oral Daily   sodium bicarbonate   1,300 mg Oral BID   sodium chloride  flush  3 mL Intravenous Q12H   sucralfate   1 g Oral TID WC & HS   traZODone   50 mg Oral QHS    cefTRIAXone  (  ROCEPHIN )  IV 1 g (01/02/24 1750)   acetaminophen  **OR** acetaminophen , albuterol , ALPRAZolam , alum & mag hydroxide-simeth, bisacodyl , ondansetron  **OR** ondansetron  (ZOFRAN ) IV, oxyCODONE , sodium chloride  flush

## 2024-01-03 NOTE — Progress Notes (Signed)
   01/03/24 0315  Vitals  Temp 98.5 F (36.9 C)  Temp Source Oral  BP (!) 158/86  MAP (mmHg) 104  BP Location Left Arm  Pulse Rate 76  ECG Heart Rate 77  Resp (!) 22  Oxygen  Therapy  SpO2 100 %  During Treatment Monitoring  Blood Flow Rate (mL/min) 0 mL/min  Arterial Pressure (mmHg) -0.8 mmHg  Venous Pressure (mmHg) -1.01 mmHg  TMP (mmHg) -50.1 mmHg  Ultrafiltration Rate (mL/min) 1305 mL/min  Dialysate Flow Rate (mL/min) 299 ml/min  Dialysate Potassium Concentration 3  Dialysate Calcium  Concentration 2.5  Duration of HD Treatment -hour(s) 3 hour(s)  Cumulative Fluid Removed (mL) per Treatment  3000.25  HD Safety Checks Performed Yes  Intra-Hemodialysis Comments Tx completed  Post Treatment  Dialyzer Clearance Lightly streaked  Liters Processed 53.6  Fluid Removed (mL) 3000 mL  Post-Hemodialysis Comments  (Pt states feeling Better)  AVG/AVF Arterial Site Held (minutes) 10 minutes  AVG/AVF Venous Site Held (minutes) 10 minutes

## 2024-01-04 LAB — CULTURE, BLOOD (ROUTINE X 2)
Culture: NO GROWTH
Culture: NO GROWTH
Special Requests: ADEQUATE
Special Requests: ADEQUATE

## 2024-01-04 LAB — CBC
HCT: 22.6 % — ABNORMAL LOW (ref 36.0–46.0)
Hemoglobin: 7.4 g/dL — ABNORMAL LOW (ref 12.0–15.0)
MCH: 31 pg (ref 26.0–34.0)
MCHC: 32.7 g/dL (ref 30.0–36.0)
MCV: 94.6 fL (ref 80.0–100.0)
Platelets: 240 K/uL (ref 150–400)
RBC: 2.39 MIL/uL — ABNORMAL LOW (ref 3.87–5.11)
RDW: 16.7 % — ABNORMAL HIGH (ref 11.5–15.5)
WBC: 5.6 K/uL (ref 4.0–10.5)
nRBC: 0 % (ref 0.0–0.2)

## 2024-01-04 LAB — COMPREHENSIVE METABOLIC PANEL WITH GFR
ALT: 16 U/L (ref 0–44)
AST: 33 U/L (ref 15–41)
Albumin: 2.1 g/dL — ABNORMAL LOW (ref 3.5–5.0)
Alkaline Phosphatase: 70 U/L (ref 38–126)
Anion gap: 10 (ref 5–15)
BUN: 30 mg/dL — ABNORMAL HIGH (ref 8–23)
CO2: 25 mmol/L (ref 22–32)
Calcium: 8.2 mg/dL — ABNORMAL LOW (ref 8.9–10.3)
Chloride: 93 mmol/L — ABNORMAL LOW (ref 98–111)
Creatinine, Ser: 3.9 mg/dL — ABNORMAL HIGH (ref 0.44–1.00)
GFR, Estimated: 12 mL/min — ABNORMAL LOW (ref >60)
Glucose, Bld: 101 mg/dL — ABNORMAL HIGH (ref 70–99)
Potassium: 4 mmol/L (ref 3.5–5.1)
Sodium: 128 mmol/L — ABNORMAL LOW (ref 135–145)
Total Bilirubin: 0.8 mg/dL (ref 0.0–1.2)
Total Protein: 5.4 g/dL — ABNORMAL LOW (ref 6.5–8.1)

## 2024-01-04 LAB — TRIGLYCERIDES, BODY FLUIDS: Triglycerides, Fluid: 38 mg/dL

## 2024-01-04 LAB — MAGNESIUM: Magnesium: 1.8 mg/dL (ref 1.7–2.4)

## 2024-01-04 MED ORDER — CHLORHEXIDINE GLUCONATE CLOTH 2 % EX PADS
6.0000 | MEDICATED_PAD | Freq: Every day | CUTANEOUS | Status: DC
  Administered 2024-01-05 – 2024-01-08 (×3): 6 via TOPICAL

## 2024-01-04 NOTE — Progress Notes (Addendum)
 Progress Note   Patient: [name] FMW:[id number] DOB: 12/10/1960 DOA: 12/30/2023  DOS: the patient was seen and examined on 01/04/2024   Brief hospital course:  63 y.o. female with PMH significant for ESRD on hemodialysis, Noncompliant with dialysis, homeless, COPD, Chronic diastolic CHF, Chronic pain syndrome, history of polysubstance use including cocaine, depression, GERD, alcohol  use, chronic tobacco use presented in the ED with abdominal distention and shortness of breath.     Assessment and Plan:   Acute hypoxic respiratory failure - Secondary to volume overload.  Initially placed on 3 L nasal cannula.  Able to be weaned down to room air.  Resolved.   Acute exacerbation HFpEF/ESRD/volume overload - Noncompliant with HD the outpatient setting.  Homeless, comes to ED for HD periodically.  Nephrology on board and following closely.  Tolerated HD/UF removing 6.3 UF total so far.   Ascites - No known history of ascites in the past but noted abdominal distention on presentation.  S/p paracentesis 10/17 with 2.8 L removed.  Studies not suggestive of infection.   COPD - Inhalers/nebulizers on board.  Wean O2 as above.  Does not appear to be in acute exacerbation.   Anemia of chronic kidney disease - S/p 1 unit PRBCs yesterday 10/17.  Hemoglobin showing slow downtrend, now 7.4.  No active bleeding appreciated.  Will transfuse and collaboration with nephrology when hemoglobin less than 7.   Severe peripheral arterial disease/peripheral neuropathy - Pain not well-controlled.  Continues on gabapentin  3 times daily 100 mg.  oxycodone  IR 5 mg from every 4 hours as needed.   Possible urinary tract infection (POA) - UA noting LE, WBCs, bacteria.  Preliminary urine cultures noting possible group B strep.  Currently on ceftriaxone .   Polysubstance abuse - Noted positive tox screen for cocaine on presentation.  Encourage cessation.  Left foot foreign body (POA) - Noted on imaging  11/2023.  Apparently a radiopaque BB as well as concern for a 3 mm needle tip between her 1st and 2nd toe.  Chronicity unclear.  Length of treatment: To patient's ongoing BL LEE pain.  No urgent need for surgical intervention at this time.  Does not appear to be acutely infected.   Goals of care - Patient is homeless.  Will consult with Child psychotherapist.  Working closely with case management and nephrology on disposition planning.  Pt is very anxious about her disposition and medications.   Subjective: Patient resting comfortably this morning.  States she slept great overnight.  Continues to complain of throbbing lower extremity pain.  Admits that she is has foreign body in her left foot and does not know how it got there.  Was wondering if surgery was needed.  Otherwise denies fever, shortness of breath, chest pain, nausea, vomiting, abdominal pain.  Physical Exam:  Vitals:   01/03/24 1944 01/03/24 2312 01/04/24 0404 01/04/24 0825  BP: 120/74 (!) 148/67 122/64 (!) 157/77  Pulse: 79 72 64 80  Resp: 16 20 18 19   Temp: 98.9 F (37.2 C) 97.9 F (36.6 C) 97.9 F (36.6 C)   TempSrc: Oral Oral Oral   SpO2: 96% 98% 98% 98%  Weight:      Height:        GENERAL:  Alert, pleasant, cachectic HEENT:  EOMI CARDIOVASCULAR:  RRR, no murmurs appreciated RESPIRATORY:  Clear to auscultation, no wheezing, rales, or rhonchi GASTROINTESTINAL: Nondistended EXTREMITIES: Thin, trace edema BL LE NEURO:  No new focal deficits appreciated SKIN: Scattered wounds in the lower extremities in various  stages of healing, fresh bandages covering PSYCH:  Appropriate mood and affect, anxious   Data Reviewed:  Imaging Studies: IR Paracentesis Result Date: 01/02/2024 INDICATION: 63 year old female with history of HFpEF/ESRD with volume overload, admitted for acute respiratory failure, and diagnosed with new onset ascites. IR was requested for diagnostic and therapeutic paracentesis. EXAM: ULTRASOUND GUIDED DIAGNOSTIC  AND THERAPEUTIC PARACENTESIS MEDICATIONS: 5 cc of 1% lidocaine  with epi. COMPLICATIONS: None immediate. PROCEDURE: Informed written consent was obtained from the patient after a discussion of the risks, benefits and alternatives to treatment. A timeout was performed prior to the initiation of the procedure. Initial ultrasound scanning demonstrates a large amount of ascites within the right lower abdominal quadrant. The right lower abdomen was prepped and draped in the usual sterile fashion. 1% lidocaine  was used for local anesthesia. Following this, a 19 gauge, 7-cm, Yueh catheter was introduced. An ultrasound image was saved for documentation purposes. The paracentesis was performed. The catheter was removed and a dressing was applied. The patient tolerated the procedure well without immediate post procedural complication. FINDINGS: A total of approximately 2.8 L of clear, straw-colored peritoneal fluid was removed. Samples were sent to the laboratory as requested by the clinical team. IMPRESSION: Successful ultrasound-guided paracentesis yielding 2.8 liters of peritoneal fluid. Procedure performed by Carlin Griffon, PA-C Electronically Signed   By: Cordella Banner   On: 01/02/2024 16:56   CT Angio Chest/Abd/Pel for Dissection W and/or Wo Contrast Result Date: 12/30/2023 EXAM: CT CHEST, ABDOMEN AND PELVIS WITH AND WITHOUT CONTRAST 12/30/2023 03:51:10 PM TECHNIQUE: CT of the chest, abdomen and pelvis was performed with and without the administration of 100 mL of iohexol  (OMNIPAQUE ) 350 MG/ML injection. Multiplanar reformatted images are provided for review. Automated exposure control, iterative reconstruction, and/or weight based adjustment of the mA/kV was utilized to reduce the radiation dose to as low as reasonably achievable. COMPARISON: 10/23/2023 CLINICAL HISTORY: Aortic aneurysm suspected. Shortness of Breath; CT Angio Chest/Abd/Pel for Dissection W and/or Wo Contrast; Aortic aneurysm suspected. Omni 350  ; Pt bib [facility]. Pt has missed dialysis for about a month and states she has been unable to go due to not having the resources. Pt abdomen distended and pt c/o shortness of breath and lower back pain. FINDINGS: CHEST: MEDIASTINUM AND LYMPH NODES: Mild cardiomegaly. Coronary artery calcifications are noted. The central airways are clear. No mediastinal, hilar or axillary lymphadenopathy. LUNGS AND PLEURA: No focal consolidation or pulmonary edema. Small left pleural effusion is noted. Minimal right pleural effusion. Minimal bibasilar subsegmental atelectasis. Minimal emphysematous disease. No pneumothorax. ABDOMEN AND PELVIS: LIVER: Probable hepatic cirrhosis. GALLBLADDER AND BILE DUCTS: Gallbladder is unremarkable. No biliary ductal dilatation. SPLEEN: No acute abnormality. PANCREAS: No acute abnormality. ADRENAL GLANDS: No acute abnormality. KIDNEYS, URETERS AND BLADDER: Severe stenosis involving the proximal left renal artery. The right renal artery is not well opacified. No stones in the kidneys or ureters. No hydronephrosis. No perinephric or periureteral stranding. Urinary bladder is unremarkable. GI AND BOWEL: Stomach demonstrates no acute abnormality. Severe narrowing at the origin of the inferior mesenteric artery. There is no bowel obstruction. REPRODUCTIVE ORGANS: No acute abnormality. PERITONEUM AND RETROPERITONEUM: Moderate ascites. No free air. VASCULATURE: Aortic atherosclerosis. Occlusion of proximal portions of both superficial femoral arteries. Moderate stenosis of the origin of the right common iliac artery secondary to calcified plaque. Severe stenosis involving the proximal left renal artery. The right renal artery is not well opacified. Severe narrowing at the origin of the inferior mesenteric artery. ABDOMINAL  AND PELVIS LYMPH NODES: No lymphadenopathy. REPRODUCTIVE ORGANS: No acute abnormality. BONES AND SOFT TISSUES: No acute osseous abnormality. No  focal soft tissue abnormality. IMPRESSION: 1. No evidence of aortic dissection or aneurysm. 2. Severe stenosis of the proximal left renal artery. Right renal artery not well opacified. 3. Severe narrowing at the origin of the inferior mesenteric artery. 4. Moderate stenosis at the origin of the right common iliac artery due to calcified plaque. 5. Occlusion of the proximal portions of both superficial femoral arteries. 6. Probable hepatic cirrhosis with moderate ascites. Electronically signed by: Lynwood Seip MD 12/30/2023 04:09 PM EDT RP Workstation: [id number]   DG Chest Port 1 View Result Date: 12/30/2023 CLINICAL DATA:  Questionable sepsis - evaluate for abnormality EXAM: PORTABLE CHEST - 1 VIEW COMPARISON:  11/24/2023 FINDINGS: Biapical pleural thickening. Small right pleural effusion with right basilar atelectasis. No new airspace consolidation or pneumothorax. Mild cardiomegaly. Tortuous aorta with aortic atherosclerosis. No acute fracture or destructive lesions. Multilevel thoracic osteophytosis. IMPRESSION: Mild cardiomegaly with small right pleural effusion. Electronically Signed   By: Rogelia Myers M.D.   On: 12/30/2023 14:28    There are no new results to review at this time.  Previous records (including but not limited to H&P, progress notes, nursing notes, TOC management) were reviewed in assessment of this patient.  Labs: CBC: Recent Labs  Lab 12/31/23 0442 12/31/23 0547 01/01/24 0326 01/01/24 1449 01/02/24 0313 01/03/24 0840 01/04/24 0403  WBC 6.6  --  6.3  --  6.0 5.3 5.6  HGB 6.8*   < > 6.5* 8.1* 8.2* 7.6* 7.4*  HCT 20.9*   < > 19.9* 24.3* 24.2* 23.0* 22.6*  MCV 95.4  --  94.3  --  90.3 93.1 94.6  PLT 347  --  269  --  290 246 240   < > = values in this interval not displayed.   Basic Metabolic Panel: Recent Labs  Lab 12/31/23 0442 01/01/24 0326 01/02/24 0313 01/03/24 0840 01/04/24 0403  NA 131* 132* 130* 129* 128*  K 4.2 3.7 4.0 4.2 4.0  CL 104 97* 94* 94*  93*  CO2 14* 21* 21* 24 25  GLUCOSE 105* 98 101* 83 101*  BUN 56* 28* 35* 17 30*  CREATININE 6.37* 4.22* 4.49* 2.81* 3.90*  CALCIUM  7.6* 7.6* 8.0* 8.0* 8.2*  MG 1.7 1.7 1.8 1.7 1.8  PHOS 7.1*  --  3.6 3.1  --    Liver Function Tests: Recent Labs  Lab 12/30/23 1222 12/31/23 0442 01/03/24 0840 01/04/24 0403  AST 13* 15 32 33  ALT 10 7 15 16   ALKPHOS 90 77 73 70  BILITOT 0.4 0.4 0.5 0.8  PROT 6.2* 5.5* 5.6* 5.4*  ALBUMIN  2.6* 2.3* 2.2* 2.1*   CBG: Recent Labs  Lab 12/30/23 1225  GLUCAP 74    Scheduled Meds:  amLODipine   10 mg Oral Daily   atorvastatin   10 mg Oral Daily   calcitRIOL   0.25 mcg Oral Daily   carvedilol   6.25 mg Oral BID WC   Chlorhexidine  Gluconate Cloth  6 each Topical Q0600   Chlorhexidine  Gluconate Cloth  6 each Topical Q0600   [START ON 01/05/2024] Chlorhexidine  Gluconate Cloth  6 each Topical Q0600   docusate sodium   100 mg Oral BID   ferrous sulfate   325 mg Oral Q breakfast   gabapentin   100 mg Oral TID   guaiFENesin   600 mg Oral BID   heparin   5,000 Units Subcutaneous Q8H   pantoprazole   40 mg Oral  BID   polyethylene glycol  17 g Oral Daily   sodium bicarbonate   1,300 mg Oral BID   sodium chloride  flush  3 mL Intravenous Q12H   sucralfate   1 g Oral TID WC & HS   traZODone   50 mg Oral QHS   Continuous Infusions:  cefTRIAXone  (ROCEPHIN )  IV 1 g (01/03/24 1711)   PRN Meds:.acetaminophen  **OR** acetaminophen , albuterol , ALPRAZolam , alum & mag hydroxide-simeth, bisacodyl , ondansetron  **OR** ondansetron  (ZOFRAN ) IV, oxyCODONE , sodium chloride  flush  Family Communication: None at bedside  Disposition: Status is: Inpatient Remains inpatient appropriate because: HFrEF/ESRD     Time spent: 40 minutes  Length of inpatient stay: 5 days  Author: Carliss LELON Canales, DO 01/04/2024 1:48 PM  For on call review [email or web address].

## 2024-01-04 NOTE — Progress Notes (Signed)
 [facility] Progress Note  Subjective:  No c/o's today  Vitals:   01/03/24 1944 01/03/24 2312 01/04/24 0404 01/04/24 0825  BP: 120/74 (!) 148/67 122/64 (!) 157/77  Pulse: 79 72 64 80  Resp: 16 20 18 19   Temp: 98.9 F (37.2 C) 97.9 F (36.6 C) 97.9 F (36.6 C)   TempSrc: Oral Oral Oral   SpO2: 96% 98% 98% 98%  Weight:      Height:        Exam: Gen alert, on RA, looks better No jvd  Chest clear bilat to bases RRR no MRG Abd soft ntnd no mass, 1-2+ ascites, not tight Ext no sig LE edema Neuro is alert, Ox 3 , nf    RUA AVF + bruit   Home bp meds: Norvasc  10 mg daily Coreg  6.25 mg twice daily    OP HD: no OP HD unit  Pt is basically homeless and comes to hospital for dialysis every 3-4 wks or so       Assessment/ Plan: SOB/ volume overload: no edema initially per CXR, though pt was vol overloaded. Severe ascites perhaps contributed to SOB. Had HD here Wed and Friday w/ 6-7 L off total, also 2.8 L paracentesis. Looks better.  Ascites: patient states she has not had this swelling in the past.  IR did paracentesis for 2.8 L on 10/17.  ESRD: no OP HD unit. Comes to hospital for HD when feeling bad. Had HD here Wed + Friday. Next HD Monday.  HTN/ vol: bp's high, getting norvasc / coreg  here as at home. BP's wnl now, 130/75 range.  Anemia of esrd: Hb was low in 6-8 range on admit. Sp PRBC's. Hb was 8s but back to 7s today. Don't think that esa's would help given her infrequent visits to renal caregivers. Tsat 30% on 10/15, no IV fe needed.     Myer Fret MD  [facility] 01/04/2024, 11:22 AM  Recent Labs  Lab 01/02/24 0313 01/03/24 0840 01/04/24 0403  HGB 8.2* 7.6* 7.4*  ALBUMIN   --  2.2* 2.1*  CALCIUM  8.0* 8.0* 8.2*  PHOS 3.6 3.1  --   CREATININE 4.49* 2.81* 3.90*  K 4.0 4.2 4.0   Recent Labs  Lab 12/31/23 0442  IRON  55  TIBC 186*  FERRITIN 256   Inpatient medications:  amLODipine   10 mg Oral Daily   atorvastatin   10 mg Oral Daily   calcitRIOL    0.25 mcg Oral Daily   carvedilol   6.25 mg Oral BID WC   Chlorhexidine  Gluconate Cloth  6 each Topical Q0600   Chlorhexidine  Gluconate Cloth  6 each Topical Q0600   docusate sodium   100 mg Oral BID   ferrous sulfate   325 mg Oral Q breakfast   gabapentin   100 mg Oral TID   guaiFENesin   600 mg Oral BID   heparin   5,000 Units Subcutaneous Q8H   pantoprazole   40 mg Oral BID   polyethylene glycol  17 g Oral Daily   sodium bicarbonate   1,300 mg Oral BID   sodium chloride  flush  3 mL Intravenous Q12H   sucralfate   1 g Oral TID WC & HS   traZODone   50 mg Oral QHS    cefTRIAXone  (ROCEPHIN )  IV 1 g (01/03/24 1711)   acetaminophen  **OR** acetaminophen , albuterol , ALPRAZolam , alum & mag hydroxide-simeth, bisacodyl , ondansetron  **OR** ondansetron  (ZOFRAN ) IV, oxyCODONE , sodium chloride  flush

## 2024-01-04 NOTE — Progress Notes (Signed)
 Mobility Specialist Progress Note:    01/04/24 0931  Mobility  Activity Ambulated with assistance  Level of Assistance Contact guard assist, steadying assist  Assistive Device Cane  Distance Ambulated (ft) 30 ft (x2)  Activity Response Tolerated well  Mobility Referral Yes  Mobility visit 1 Mobility  Mobility Specialist Start Time (ACUTE ONLY) 0931  Mobility Specialist Stop Time (ACUTE ONLY) 0954  Mobility Specialist Time Calculation (min) (ACUTE ONLY) 23 min   Received in bed, agreeable to mobility session after max encouragement from MS and RN. Pt reportedly ambulating to bathroom with cane and min A over past few days.  Pt ambulated in hallway with MS using cane and CGA. Initially demonstrated steady gait and good tolerance. As session progressed, pt became increasingly irritated with lines and ankle discomfort, leading to impulsive attempts to continue ambulation despite verbal/tactile cues to rest. With each impulsive attempt, pt's gait quality declined--showing increased unsteadiness, staggering, and poor safety awareness. Pt assisted to seated position with MinA2 and gait belt. During attempt to roll pt back to room due to instability, pt again stood impulsively and ambulated unsteadily back to room with MinA. Returned to bed, lying with all needs met, RN notified.  Latron Ribas Mobility Specialist Please contact via Special educational needs teacher or  Rehab office at [phone]

## 2024-01-05 DIAGNOSIS — I10 Essential (primary) hypertension: Secondary | ICD-10-CM

## 2024-01-05 LAB — CBC
HCT: 22.2 % — ABNORMAL LOW (ref 36.0–46.0)
Hemoglobin: 7.3 g/dL — ABNORMAL LOW (ref 12.0–15.0)
MCH: 31.3 pg (ref 26.0–34.0)
MCHC: 32.9 g/dL (ref 30.0–36.0)
MCV: 95.3 fL (ref 80.0–100.0)
Platelets: 240 K/uL (ref 150–400)
RBC: 2.33 MIL/uL — ABNORMAL LOW (ref 3.87–5.11)
RDW: 16.5 % — ABNORMAL HIGH (ref 11.5–15.5)
WBC: 7.1 K/uL (ref 4.0–10.5)
nRBC: 0 % (ref 0.0–0.2)

## 2024-01-05 LAB — COMPREHENSIVE METABOLIC PANEL WITH GFR
ALT: 15 U/L (ref 0–44)
AST: 25 U/L (ref 15–41)
Albumin: 2.3 g/dL — ABNORMAL LOW (ref 3.5–5.0)
Alkaline Phosphatase: 82 U/L (ref 38–126)
Anion gap: 15 (ref 5–15)
BUN: 38 mg/dL — ABNORMAL HIGH (ref 8–23)
CO2: 20 mmol/L — ABNORMAL LOW (ref 22–32)
Calcium: 8.3 mg/dL — ABNORMAL LOW (ref 8.9–10.3)
Chloride: 91 mmol/L — ABNORMAL LOW (ref 98–111)
Creatinine, Ser: 4.48 mg/dL — ABNORMAL HIGH (ref 0.44–1.00)
GFR, Estimated: 10 mL/min — ABNORMAL LOW (ref >60)
Glucose, Bld: 109 mg/dL — ABNORMAL HIGH (ref 70–99)
Potassium: 4.2 mmol/L (ref 3.5–5.1)
Sodium: 126 mmol/L — ABNORMAL LOW (ref 135–145)
Total Bilirubin: 1 mg/dL (ref 0.0–1.2)
Total Protein: 5.8 g/dL — ABNORMAL LOW (ref 6.5–8.1)

## 2024-01-05 LAB — CYTOLOGY - NON PAP

## 2024-01-05 LAB — PH, BODY FLUID: pH, Body Fluid: 8

## 2024-01-05 MED ORDER — OXYCODONE HCL 5 MG PO TABS
5.0000 mg | ORAL_TABLET | ORAL | Status: AC
  Administered 2024-01-05: 5 mg via ORAL
  Filled 2024-01-05: qty 1

## 2024-01-05 MED ORDER — HEPARIN SODIUM (PORCINE) 1000 UNIT/ML DIALYSIS
2000.0000 [IU] | Freq: Once | INTRAMUSCULAR | Status: AC
  Administered 2024-01-05: 2000 [IU] via INTRAVENOUS_CENTRAL

## 2024-01-05 MED ORDER — HEPARIN SODIUM (PORCINE) 1000 UNIT/ML IJ SOLN
INTRAMUSCULAR | Status: AC
  Filled 2024-01-05: qty 4

## 2024-01-05 MED ORDER — HYDROMORPHONE HCL 1 MG/ML IJ SOLN
0.5000 mg | Freq: Once | INTRAMUSCULAR | Status: AC
  Administered 2024-01-05: 0.5 mg via INTRAVENOUS
  Filled 2024-01-05: qty 0.5

## 2024-01-05 MED ORDER — PENTAFLUOROPROP-TETRAFLUOROETH EX AERO: 1.0000 | INHALATION_SPRAY | CUTANEOUS | Status: DC | PRN

## 2024-01-05 MED ORDER — HEPARIN SODIUM (PORCINE) 1000 UNIT/ML DIALYSIS
1500.0000 [IU] | INTRAMUSCULAR | Status: AC | PRN
  Administered 2024-01-05: 1500 [IU] via INTRAVENOUS_CENTRAL

## 2024-01-05 NOTE — Procedures (Signed)
 HD Note:  Some information was entered later than the data was gathered due to patient care needs. The stated time with the data is accurate.  Received patient in bed to unit.   Alert and oriented.   Informed consent signed and in chart.   Access used:  upper right arm fistula Access issues: None  Patient tolerating treatment well.  Complaints of pain, but stated she wanted to wait until she finished treatment to be medicated.  Patient received medication prior to treatment.  Hand-off given to Dialysis nurse for the rest of the treatment.    Yusef Lamp L. Lenon, RN [facility].

## 2024-01-05 NOTE — Progress Notes (Signed)
   01/05/24 1828  Vitals  Temp 98.1 F (36.7 C)  Pulse Rate 79  Resp 18  BP (!) 144/71  SpO2 99 %  Weight 50.8 kg (Bed Scale)  Type of Weight Post-Dialysis  Oxygen  Therapy  Pulse Oximetry Type Continuous  Post Treatment  Dialyzer Clearance Clear  Hemodialysis Intake (mL) 0 mL  Liters Processed 71.9  Fluid Removed (mL) 3000 mL  Tolerated HD Treatment Yes  Post-Hemodialysis Comments Pt. tolerated HD treatment without difficulties. UF goal met and report call to 4E bedside RN. Admin medication per. order.   Received patient in bed to unit.  Alert and oriented.  Informed consent signed and in chart.   TX duration:3  Patient tolerated well.  Transported back to the room  Alert, without acute distress.  Hand-off given to patient's nurse.   Access used: Yes Access issues: No  Total UF removed: 3000 Medication(s) given: See MAR Post HD VS: See Above Grid Post HD weight: 50.8 kg   Zebedee DELENA [facility]

## 2024-01-05 NOTE — Progress Notes (Signed)
 Progress Note   Patient: [name] FMW:[id number] DOB: 1960/09/26 DOA: 12/30/2023  DOS: the patient was seen and examined on 01/05/2024   Brief hospital course:  63 y.o. female with PMH significant for ESRD on hemodialysis, Noncompliant with dialysis, homeless, COPD, Chronic diastolic CHF, Chronic pain syndrome, history of polysubstance use including cocaine, depression, GERD, alcohol  use, chronic tobacco use presented in the ED with abdominal distention and shortness of breath.     Assessment and Plan:   Acute hypoxic respiratory failure - Secondary to volume overload.  Initially placed on 3 L nasal cannula.  Able to be weaned down to room air.  Resolved.   Acute exacerbation HFpEF/ESRD/volume overload - Noncompliant with HD the outpatient setting.  Homeless, comes to ED for HD periodically.  Nephrology on board and following closely.  Tolerated HD/UF removing 6.3 UF total so far.  Next HD today.   Ascites - No known history of ascites in the past but noted abdominal distention on presentation.  S/p paracentesis 10/17 with 2.8 L removed.  Studies not suggestive of infection.  Starting to show signs of reaccumulation.  May consider repeat therapeutic paracentesis prior to discharge.   COPD - Inhalers/nebulizers on board.  Wean O2 as above.  Does not appear to be in acute exacerbation.   Anemia of chronic kidney disease - S/p 1 unit PRBCs yesterday 10/17.  Hemoglobin showing slow downtrend, now 7.3.  No active bleeding appreciated.  Will transfuse and collaboration with nephrology when hemoglobin less than 7.   Severe peripheral arterial disease/peripheral neuropathy - Pain not well-controlled.  Continues on gabapentin  3 times daily 100 mg.  oxycodone  IR 5 mg from every 4 hours as needed.   Possible urinary tract infection (POA) - UA noting LE, WBCs, bacteria.  Preliminary urine cultures noting possible group B strep.  Currently on ceftriaxone .   Polysubstance abuse - Noted  positive tox screen for cocaine on presentation.  Encourage cessation.  Left foot foreign body (POA) - Noted on imaging 11/2023.  Apparently a radiopaque BB as well as concern for a 3 mm needle tip between her 1st and 2nd toe.  Chronicity unclear.  No urgent need for surgical intervention at this time.  Does not appear to be acutely infected.   Goals of care - Patient is homeless.  Will consult with Child psychotherapist.  Working closely with case management and nephrology on disposition planning.  Pt is very anxious about her disposition and medications.   Subjective: Patient resting comfortably this morning.  Continues to complain of throbbing lower extremity pain.  Otherwise denies fever, shortness of breath, chest pain, nausea, vomiting.  Still with some abdominal pain.  Physical Exam:  Vitals:   01/05/24 0354 01/05/24 0500 01/05/24 0759 01/05/24 1210  BP: 136/67  (!) 157/84 (!) 142/72  Pulse:   78 76  Resp: 15 17 14 16   Temp: 97.8 F (36.6 C)  98.1 F (36.7 C) 98.8 F (37.1 C)  TempSrc: Oral  Oral Oral  SpO2:   94% 96%  Weight:  53.9 kg    Height:        GENERAL:  Alert, pleasant, cachectic HEENT:  EOMI CARDIOVASCULAR:  RRR, no murmurs appreciated RESPIRATORY:  Clear to auscultation, no wheezing, rales, or rhonchi GASTROINTESTINAL: Mildly distended, fluid-filled EXTREMITIES: Thin, trace edema BL LE NEURO:  No new focal deficits appreciated SKIN: Scattered wounds in the lower extremities in various stages of healing, fresh bandages covering PSYCH:  Appropriate mood and affect, anxious   Data  Reviewed:  Imaging Studies: IR Paracentesis Result Date: 01/02/2024 INDICATION: 63 year old female with history of HFpEF/ESRD with volume overload, admitted for acute respiratory failure, and diagnosed with new onset ascites. IR was requested for diagnostic and therapeutic paracentesis. EXAM: ULTRASOUND GUIDED DIAGNOSTIC AND THERAPEUTIC PARACENTESIS MEDICATIONS: 5 cc of 1% lidocaine  with  epi. COMPLICATIONS: None immediate. PROCEDURE: Informed written consent was obtained from the patient after a discussion of the risks, benefits and alternatives to treatment. A timeout was performed prior to the initiation of the procedure. Initial ultrasound scanning demonstrates a large amount of ascites within the right lower abdominal quadrant. The right lower abdomen was prepped and draped in the usual sterile fashion. 1% lidocaine  was used for local anesthesia. Following this, a 19 gauge, 7-cm, Yueh catheter was introduced. An ultrasound image was saved for documentation purposes. The paracentesis was performed. The catheter was removed and a dressing was applied. The patient tolerated the procedure well without immediate post procedural complication. FINDINGS: A total of approximately 2.8 L of clear, straw-colored peritoneal fluid was removed. Samples were sent to the laboratory as requested by the clinical team. IMPRESSION: Successful ultrasound-guided paracentesis yielding 2.8 liters of peritoneal fluid. Procedure performed by Carlin Griffon, PA-C Electronically Signed   By: Cordella Banner   On: 01/02/2024 16:56   CT Angio Chest/Abd/Pel for Dissection W and/or Wo Contrast Result Date: 12/30/2023 EXAM: CT CHEST, ABDOMEN AND PELVIS WITH AND WITHOUT CONTRAST 12/30/2023 03:51:10 PM TECHNIQUE: CT of the chest, abdomen and pelvis was performed with and without the administration of 100 mL of iohexol  (OMNIPAQUE ) 350 MG/ML injection. Multiplanar reformatted images are provided for review. Automated exposure control, iterative reconstruction, and/or weight based adjustment of the mA/kV was utilized to reduce the radiation dose to as low as reasonably achievable. COMPARISON: 10/23/2023 CLINICAL HISTORY: Aortic aneurysm suspected. Shortness of Breath; CT Angio Chest/Abd/Pel for Dissection W and/or Wo Contrast; Aortic aneurysm suspected. Omni 350 ; Pt bib [facility] coming from [facility] patient staying [facility]. Pt has  missed dialysis for about a month and states she has been unable to go due to not having the resources. Pt abdomen distended and pt c/o shortness of breath and lower back pain. FINDINGS: CHEST: MEDIASTINUM AND LYMPH NODES: Mild cardiomegaly. Coronary artery calcifications are noted. The central airways are clear. No mediastinal, hilar or axillary lymphadenopathy. LUNGS AND PLEURA: No focal consolidation or pulmonary edema. Small left pleural effusion is noted. Minimal right pleural effusion. Minimal bibasilar subsegmental atelectasis. Minimal emphysematous disease. No pneumothorax. ABDOMEN AND PELVIS: LIVER: Probable hepatic cirrhosis. GALLBLADDER AND BILE DUCTS: Gallbladder is unremarkable. No biliary ductal dilatation. SPLEEN: No acute abnormality. PANCREAS: No acute abnormality. ADRENAL GLANDS: No acute abnormality. KIDNEYS, URETERS AND BLADDER: Severe stenosis involving the proximal left renal artery. The right renal artery is not well opacified. No stones in the kidneys or ureters. No hydronephrosis. No perinephric or periureteral stranding. Urinary bladder is unremarkable. GI AND BOWEL: Stomach demonstrates no acute abnormality. Severe narrowing at the origin of the inferior mesenteric artery. There is no bowel obstruction. REPRODUCTIVE ORGANS: No acute abnormality. PERITONEUM AND RETROPERITONEUM: Moderate ascites. No free air. VASCULATURE: Aortic atherosclerosis. Occlusion of proximal portions of both superficial femoral arteries. Moderate stenosis of the origin of the right common iliac artery secondary to calcified plaque. Severe stenosis involving the proximal left renal artery. The right renal artery is not well opacified. Severe narrowing at the origin of the inferior mesenteric artery. ABDOMINAL AND PELVIS LYMPH NODES: No lymphadenopathy. REPRODUCTIVE ORGANS: No acute abnormality. BONES AND SOFT TISSUES: No  acute osseous abnormality. No focal soft tissue abnormality. IMPRESSION: 1. No evidence of aortic  dissection or aneurysm. 2. Severe stenosis of the proximal left renal artery. Right renal artery not well opacified. 3. Severe narrowing at the origin of the inferior mesenteric artery. 4. Moderate stenosis at the origin of the right common iliac artery due to calcified plaque. 5. Occlusion of the proximal portions of both superficial femoral arteries. 6. Probable hepatic cirrhosis with moderate ascites. Electronically signed by: Lynwood Seip MD 12/30/2023 04:09 PM EDT RP Workstation: [id number]   DG Chest Port 1 View Result Date: 12/30/2023 CLINICAL DATA:  Questionable sepsis - evaluate for abnormality EXAM: PORTABLE CHEST - 1 VIEW COMPARISON:  11/24/2023 FINDINGS: Biapical pleural thickening. Small right pleural effusion with right basilar atelectasis. No new airspace consolidation or pneumothorax. Mild cardiomegaly. Tortuous aorta with aortic atherosclerosis. No acute fracture or destructive lesions. Multilevel thoracic osteophytosis. IMPRESSION: Mild cardiomegaly with small right pleural effusion. Electronically Signed   By: Rogelia Myers M.D.   On: 12/30/2023 14:28    There are no new results to review at this time.  Previous records (including but not limited to H&P, progress notes, nursing notes, TOC management) were reviewed in assessment of this patient.  Labs: CBC: Recent Labs  Lab 01/01/24 0326 01/01/24 1449 01/02/24 0313 01/03/24 0840 01/04/24 0403 01/05/24 0854  WBC 6.3  --  6.0 5.3 5.6 7.1  HGB 6.5* 8.1* 8.2* 7.6* 7.4* 7.3*  HCT 19.9* 24.3* 24.2* 23.0* 22.6* 22.2*  MCV 94.3  --  90.3 93.1 94.6 95.3  PLT 269  --  290 246 240 240   Basic Metabolic Panel: Recent Labs  Lab 12/31/23 0442 01/01/24 0326 01/02/24 0313 01/03/24 0840 01/04/24 0403 01/05/24 0854  NA 131* 132* 130* 129* 128* 126*  K 4.2 3.7 4.0 4.2 4.0 4.2  CL 104 97* 94* 94* 93* 91*  CO2 14* 21* 21* 24 25 20*  GLUCOSE 105* 98 101* 83 101* 109*  BUN 56* 28* 35* 17 30* 38*  CREATININE 6.37* 4.22* 4.49*  2.81* 3.90* 4.48*  CALCIUM  7.6* 7.6* 8.0* 8.0* 8.2* 8.3*  MG 1.7 1.7 1.8 1.7 1.8  --   PHOS 7.1*  --  3.6 3.1  --   --    Liver Function Tests: Recent Labs  Lab 12/30/23 1222 12/31/23 0442 01/03/24 0840 01/04/24 0403 01/05/24 0854  AST 13* 15 32 33 25  ALT 10 7 15 16 15   ALKPHOS 90 77 73 70 82  BILITOT 0.4 0.4 0.5 0.8 1.0  PROT 6.2* 5.5* 5.6* 5.4* 5.8*  ALBUMIN  2.6* 2.3* 2.2* 2.1* 2.3*   CBG: Recent Labs  Lab 12/30/23 1225  GLUCAP 74    Scheduled Meds:  amLODipine   10 mg Oral Daily   atorvastatin   10 mg Oral Daily   calcitRIOL   0.25 mcg Oral Daily   carvedilol   6.25 mg Oral BID WC   Chlorhexidine  Gluconate Cloth  6 each Topical Q0600   Chlorhexidine  Gluconate Cloth  6 each Topical Q0600   Chlorhexidine  Gluconate Cloth  6 each Topical Q0600   docusate sodium   100 mg Oral BID   ferrous sulfate   325 mg Oral Q breakfast   gabapentin   100 mg Oral TID   guaiFENesin   600 mg Oral BID   [START ON 01/06/2024] heparin   2,000 Units Dialysis Once in dialysis   heparin   5,000 Units Subcutaneous Q8H   pantoprazole   40 mg Oral BID   polyethylene glycol  17 g Oral Daily  sodium bicarbonate   1,300 mg Oral BID   sodium chloride  flush  3 mL Intravenous Q12H   sucralfate   1 g Oral TID WC & HS   traZODone   50 mg Oral QHS   Continuous Infusions:  cefTRIAXone  (ROCEPHIN )  IV Stopped (01/04/24 1743)   PRN Meds:.acetaminophen  **OR** acetaminophen , albuterol , ALPRAZolam , alum & mag hydroxide-simeth, bisacodyl , [START ON 01/06/2024] heparin , ondansetron  **OR** ondansetron  (ZOFRAN ) IV, oxyCODONE , pentafluoroprop-tetrafluoroeth, sodium chloride  flush  Family Communication: None at bedside  Disposition: Status is: Inpatient Remains inpatient appropriate because: HFrEF/ESRD     Time spent: 43 minutes  Length of inpatient stay: 6 days  Author: Carliss LELON Canales, DO 01/05/2024 1:33 PM  For on call review [email or web address].

## 2024-01-05 NOTE — Procedures (Signed)
 Patient refused treatment at approximately 0800 when this writer went to her room to transport her to the dialysis unit. Patient stated she did not want to miss breakfast.  She stated she had just ordered it.

## 2024-01-05 NOTE — Plan of Care (Signed)

## 2024-01-05 NOTE — Progress Notes (Signed)
   01/05/24 1828  Vitals  Temp 98.1 F (36.7 C)  Pulse Rate 79  Resp 18  BP (!) 144/71  SpO2 99 %  Weight 50.8 kg (Bed Scale)  Type of Weight Post-Dialysis  Oxygen  Therapy  Pulse Oximetry Type Continuous  Post Treatment  Dialyzer Clearance Clear  Hemodialysis Intake (mL) 0 mL  Liters Processed 71.9  Fluid Removed (mL) 3000 mL  Tolerated HD Treatment Yes  Post-Hemodialysis Comments Pt. tolerated HD treatment without difficulties. UF goal met and report call to 4E bedside RN. Admin medication per. order.  AVG/AVF Arterial Site Held (minutes) 8 minutes  AVG/AVF Venous Site Held (minutes) 5 minutes

## 2024-01-05 NOTE — Progress Notes (Signed)
 [facility] Progress Note   Subjective:   Seen in room, walking around. Denies SOB, CP, dizziness. Worried about discharge, says she is open to a SNF this time.   Objective Vitals:   01/04/24 2325 01/05/24 0354 01/05/24 0500 01/05/24 0759  BP: 139/71 136/67  (!) 157/84  Pulse:    78  Resp: 16 15 17 14   Temp: 98.3 F (36.8 C) 97.8 F (36.6 C)  98.1 F (36.7 C)  TempSrc: Oral Oral  Oral  SpO2:    94%  Weight:   53.9 kg   Height:       Physical Exam General: Alert female, ambulating, NAD Heart: RRR, no murmurs, rubs or gallops Lungs: CTA bilaterally, respirations unlabored Abdomen: trace ascites noted Extremities: No edema b/l lower extremities Dialysis Access: RUE AVF + t/b  Additional Objective Labs: Basic Metabolic Panel: Recent Labs  Lab 12/31/23 0442 01/01/24 0326 01/02/24 0313 01/03/24 0840 01/04/24 0403 01/05/24 0854  NA 131*   < > 130* 129* 128* 126*  K 4.2   < > 4.0 4.2 4.0 4.2  CL 104   < > 94* 94* 93* 91*  CO2 14*   < > 21* 24 25 20*  GLUCOSE 105*   < > 101* 83 101* 109*  BUN 56*   < > 35* 17 30* 38*  CREATININE 6.37*   < > 4.49* 2.81* 3.90* 4.48*  CALCIUM  7.6*   < > 8.0* 8.0* 8.2* 8.3*  PHOS 7.1*  --  3.6 3.1  --   --    < > = values in this interval not displayed.   Liver Function Tests: Recent Labs  Lab 01/03/24 0840 01/04/24 0403 01/05/24 0854  AST 32 33 25  ALT 15 16 15   ALKPHOS 73 70 82  BILITOT 0.5 0.8 1.0  PROT 5.6* 5.4* 5.8*  ALBUMIN  2.2* 2.1* 2.3*   Recent Labs  Lab 12/30/23 1222  LIPASE 33   CBC: Recent Labs  Lab 01/01/24 0326 01/01/24 1449 01/02/24 0313 01/03/24 0840 01/04/24 0403 01/05/24 0854  WBC 6.3  --  6.0 5.3 5.6 7.1  HGB 6.5*   < > 8.2* 7.6* 7.4* 7.3*  HCT 19.9*   < > 24.2* 23.0* 22.6* 22.2*  MCV 94.3  --  90.3 93.1 94.6 95.3  PLT 269  --  290 246 240 240   < > = values in this interval not displayed.   Blood Culture    Component Value Date/Time   SDES PERITONEAL 01/02/2024 1152   SDES  PERITONEAL 01/02/2024 1152   SPECREQUEST NONE 01/02/2024 1152   SPECREQUEST NONE 01/02/2024 1152   CULT  01/02/2024 1152    NO GROWTH 3 DAYS Performed at [facility], [address] [place], [place] [address]    REPTSTATUS PENDING 01/02/2024 1152   REPTSTATUS 01/02/2024 FINAL 01/02/2024 1152    Cardiac Enzymes: No results for input(s): CKTOTAL, CKMB, CKMBINDEX, TROPONINI in the last 168 hours. CBG: Recent Labs  Lab 12/30/23 1225  GLUCAP 74   Iron  Studies: No results for input(s): IRON , TIBC, TRANSFERRIN, FERRITIN in the last 72 hours. @lablastinr3 @ Studies/Results: No results found. Medications:  cefTRIAXone  (ROCEPHIN )  IV Stopped (01/04/24 1743)    amLODipine   10 mg Oral Daily   atorvastatin   10 mg Oral Daily   calcitRIOL   0.25 mcg Oral Daily   carvedilol   6.25 mg Oral BID WC   Chlorhexidine  Gluconate Cloth  6 each Topical Q0600   Chlorhexidine  Gluconate Cloth  6  each Topical Q0600   Chlorhexidine  Gluconate Cloth  6 each Topical Q0600   docusate sodium   100 mg Oral BID   ferrous sulfate   325 mg Oral Q breakfast   gabapentin   100 mg Oral TID   guaiFENesin   600 mg Oral BID   [START ON 01/06/2024] heparin   2,000 Units Dialysis Once in dialysis   heparin   5,000 Units Subcutaneous Q8H   pantoprazole   40 mg Oral BID   polyethylene glycol  17 g Oral Daily   sodium bicarbonate   1,300 mg Oral BID   sodium chloride  flush  3 mL Intravenous Q12H   sucralfate   1 g Oral TID WC & HS   traZODone   50 mg Oral QHS    Dialysis Orders: no OP HD unit   Assessment/Plan: SOB/ volume overload: no edema initially per CXR, though pt was vol overloaded. Severe ascites perhaps contributed to SOB. Had HD here Wed and Friday w/ 6-7 L off total, also 2.8 L paracentesis. Ambulating and says she feels better.   Ascites: patient states she has not had this swelling in the past.  IR did paracentesis for 2.8 L on 10/17.  ESRD: no OP HD unit. Comes to hospital for HD when  feeling bad. Had HD here Wed + Friday. Next HD Monday. SW is on board HTN/ vol: bp's high, getting norvasc / coreg  here as at home. BP's wnl now, 130/75 range.  Anemia of esrd: Hb was low in 6-8 range on admit. Sp PRBC's. Hb was 8s but back to 7s today. Sporadic HD so is not getting correct ESA doses. Tsat 30% on 10/15, no IV fe needed.   Lucie Collet, PA-C 01/05/2024, 9:57 AM  [facility] Pager: [phone]

## 2024-01-06 ENCOUNTER — Inpatient Hospital Stay (HOSPITAL_COMMUNITY)

## 2024-01-06 DIAGNOSIS — R1013 Epigastric pain: Secondary | ICD-10-CM

## 2024-01-06 LAB — CBC
HCT: 21.7 % — ABNORMAL LOW (ref 36.0–46.0)
Hemoglobin: 6.9 g/dL — CL (ref 12.0–15.0)
MCH: 30.5 pg (ref 26.0–34.0)
MCHC: 31.8 g/dL (ref 30.0–36.0)
MCV: 96 fL (ref 80.0–100.0)
Platelets: 237 K/uL (ref 150–400)
RBC: 2.26 MIL/uL — ABNORMAL LOW (ref 3.87–5.11)
RDW: 16.3 % — ABNORMAL HIGH (ref 11.5–15.5)
WBC: 6.6 K/uL (ref 4.0–10.5)
nRBC: 0 % (ref 0.0–0.2)

## 2024-01-06 LAB — RENAL FUNCTION PANEL
Albumin: 2.2 g/dL — ABNORMAL LOW (ref 3.5–5.0)
Anion gap: 11 (ref 5–15)
BUN: 22 mg/dL (ref 8–23)
CO2: 25 mmol/L (ref 22–32)
Calcium: 8.3 mg/dL — ABNORMAL LOW (ref 8.9–10.3)
Chloride: 93 mmol/L — ABNORMAL LOW (ref 98–111)
Creatinine, Ser: 2.98 mg/dL — ABNORMAL HIGH (ref 0.44–1.00)
GFR, Estimated: 17 mL/min — ABNORMAL LOW (ref >60)
Glucose, Bld: 92 mg/dL (ref 70–99)
Phosphorus: 3.2 mg/dL (ref 2.5–4.6)
Potassium: 4.3 mmol/L (ref 3.5–5.1)
Sodium: 129 mmol/L — ABNORMAL LOW (ref 135–145)

## 2024-01-06 LAB — MAGNESIUM: Magnesium: 1.8 mg/dL (ref 1.7–2.4)

## 2024-01-06 LAB — HEMOGLOBIN AND HEMATOCRIT, BLOOD
HCT: 21.9 % — ABNORMAL LOW (ref 36.0–46.0)
Hemoglobin: 7 g/dL — ABNORMAL LOW (ref 12.0–15.0)

## 2024-01-06 LAB — PREPARE RBC (CROSSMATCH)

## 2024-01-06 MED ORDER — CHLORHEXIDINE GLUCONATE CLOTH 2 % EX PADS
6.0000 | MEDICATED_PAD | Freq: Every day | CUTANEOUS | Status: DC
  Administered 2024-01-07 – 2024-01-09 (×3): 6 via TOPICAL

## 2024-01-06 MED ORDER — IOHEXOL 9 MG/ML PO SOLN
500.0000 mL | ORAL | Status: AC
  Administered 2024-01-06 (×2): 500 mL via ORAL

## 2024-01-06 MED ORDER — SODIUM CHLORIDE 0.9% IV SOLUTION: Freq: Once | INTRAVENOUS | Status: AC

## 2024-01-06 NOTE — Plan of Care (Signed)

## 2024-01-06 NOTE — Plan of Care (Signed)
 Progressing

## 2024-01-06 NOTE — Progress Notes (Signed)
 Entered room to assess pt., c/o pain in abd. Refuses to cooperate with assessment d/t pain in abd. Reassured pt that pain would be addressed. Pt refused to cooperate with assessment. Pt crying, rubbing abd. V/S stable.

## 2024-01-06 NOTE — Progress Notes (Signed)
 [facility] Progress Note   Subjective:   Seen in room, sleeping but awakens to voice. Has some wheezing on auscultation. She says she normally uses an inhaler, does have albuterol  ordered here. Denies SOB, CP, dizziness, nausea.   Objective Vitals:   01/05/24 1815 01/05/24 1828 01/05/24 1919 01/06/24 0833  BP: (!) 146/74 (!) 144/71 (!) 168/78 (!) 168/77  Pulse: 83 79 86 79  Resp: 19 18 19 18   Temp:  98.1 F (36.7 C) 97.8 F (36.6 C) 98.2 F (36.8 C)  TempSrc:   Oral Axillary  SpO2: 97% 99% 95% 96%  Weight:  50.8 kg    Height:       Physical Exam General: Alert female in NAD Heart: RRR, no murmurs, rubs or gallops Lungs: +expiratory wheeze BLL Abdomen: + ascites, + BS Extremities: No edema b/l lower extremities Dialysis Access:  RUE AVF + t/b  Additional Objective Labs: Basic Metabolic Panel: Recent Labs  Lab 01/02/24 0313 01/03/24 0840 01/04/24 0403 01/05/24 0854 01/06/24 0322  NA 130* 129* 128* 126* 129*  K 4.0 4.2 4.0 4.2 4.3  CL 94* 94* 93* 91* 93*  CO2 21* 24 25 20* 25  GLUCOSE 101* 83 101* 109* 92  BUN 35* 17 30* 38* 22  CREATININE 4.49* 2.81* 3.90* 4.48* 2.98*  CALCIUM  8.0* 8.0* 8.2* 8.3* 8.3*  PHOS 3.6 3.1  --   --  3.2   Liver Function Tests: Recent Labs  Lab 01/03/24 0840 01/04/24 0403 01/05/24 0854 01/06/24 0322  AST 32 33 25  --   ALT 15 16 15   --   ALKPHOS 73 70 82  --   BILITOT 0.5 0.8 1.0  --   PROT 5.6* 5.4* 5.8*  --   ALBUMIN  2.2* 2.1* 2.3* 2.2*   Recent Labs  Lab 12/30/23 1222  LIPASE 33   CBC: Recent Labs  Lab 01/02/24 0313 01/03/24 0840 01/04/24 0403 01/05/24 0854 01/06/24 0322 01/06/24 0640  WBC 6.0 5.3 5.6 7.1 6.6  --   HGB 8.2* 7.6* 7.4* 7.3* 6.9* 7.0*  HCT 24.2* 23.0* 22.6* 22.2* 21.7* 21.9*  MCV 90.3 93.1 94.6 95.3 96.0  --   PLT 290 246 240 240 237  --    Blood Culture    Component Value Date/Time   SDES PERITONEAL 01/02/2024 1152   SDES PERITONEAL 01/02/2024 1152   SPECREQUEST NONE  01/02/2024 1152   SPECREQUEST NONE 01/02/2024 1152   CULT  01/02/2024 1152    NO GROWTH 4 DAYS Performed at [facility], [address], [place], [place] [address]    REPTSTATUS PENDING 01/02/2024 1152   REPTSTATUS 01/02/2024 FINAL 01/02/2024 1152    Cardiac Enzymes: No results for input(s): CKTOTAL, CKMB, CKMBINDEX, TROPONINI in the last 168 hours. CBG: Recent Labs  Lab 12/30/23 1225  GLUCAP 74   Iron  Studies: No results for input(s): IRON , TIBC, TRANSFERRIN, FERRITIN in the last 72 hours. @lablastinr3 @ Studies/Results: No results found. Medications:  cefTRIAXone  (ROCEPHIN )  IV Stopped (01/05/24 2003)    sodium chloride    Intravenous Once   amLODipine   10 mg Oral Daily   atorvastatin   10 mg Oral Daily   calcitRIOL   0.25 mcg Oral Daily   carvedilol   6.25 mg Oral BID WC   Chlorhexidine  Gluconate Cloth  6 each Topical Q0600   Chlorhexidine  Gluconate Cloth  6 each Topical Q0600   Chlorhexidine  Gluconate Cloth  6 each Topical Q0600   docusate sodium   100 mg Oral BID   ferrous sulfate   325 mg Oral Q breakfast   gabapentin   100 mg Oral TID   guaiFENesin   600 mg Oral BID   heparin   5,000 Units Subcutaneous Q8H   pantoprazole   40 mg Oral BID   polyethylene glycol  17 g Oral Daily   sodium bicarbonate   1,300 mg Oral BID   sodium chloride  flush  3 mL Intravenous Q12H   sucralfate   1 g Oral TID WC & HS   traZODone   50 mg Oral QHS    Assessment/Plan: SOB/ volume overload: no edema initially per CXR, though pt was vol overloaded. Severe ascites perhaps contributed to SOB. S/p thoracentesis and multiple HD treatments, breathing si improved, Ascites:  IR did paracentesis for 2.8 L on 10/17.  ESRD: no OP HD unit due to unstable housing/transportation issues. Comes to hospital for HD when feeling bad. MWF schedule for now. SW is on board HTN/ vol: bp's high, getting norvasc / coreg  here as at home. BP's elevated today but reportedly was in pain, will follow.   Anemia of esrd: Hb was low in 6-8 range on admit. Sp PRBC's, getting another unit today Sporadic HD so is not getting correct ESA doses. Tsat 30% on 10/15, no IV fe needed.   Lucie Collet, PA-C 01/06/2024, 9:26 AM  [facility] Pager: [phone]

## 2024-01-06 NOTE — Progress Notes (Signed)
 Progress Note   Patient: [name] FMW:[id number] DOB: 1960-12-07 DOA: 12/30/2023  DOS: the patient was seen and examined on 01/06/2024   Brief hospital course:  63 y.o. female with PMH significant for ESRD on hemodialysis, Noncompliant with dialysis, homeless, COPD, Chronic diastolic CHF, Chronic pain syndrome, history of polysubstance use including cocaine, depression, GERD, alcohol  use, chronic tobacco use presented in the ED with abdominal distention and shortness of breath.     Assessment and Plan:   Acute hypoxic respiratory failure - Secondary to volume overload.  Initially placed on 3 L nasal cannula.  Able to be weaned down to room air.  Resolved.   Acute exacerbation HFpEF/ESRD/volume overload - Noncompliant with HD the outpatient setting.  Homeless, comes to ED for HD periodically.  Nephrology on board and following closely.  Tolerated HD/UF removing another 3 L yesterday, 9.3 UF total so far.  Acute epigastric pain - Complaining of epigastric pain this morning.  No vomiting, diarrhea.  Refusing pain medications.  Currently already has Protonix , sucralfate  on board.  Differential include underlying chronic gastritis, exacerbation from ascites, or even ischemic gastritis.  Will order CT abdomen pelvis without contrast initially.    Ascites - No known history of ascites in the past but noted abdominal distention on presentation.  S/p paracentesis 10/17 with 2.8 L removed.  Studies not suggestive of infection.  Starting to show signs of reaccumulation.  May possibly pursue therapeutic paracentesis prior to DC.   COPD - Inhalers/nebulizers on board.  Wean O2 as above.  Does not appear to be in acute exacerbation.   Anemia of chronic kidney disease - Hemoglobin 6.9/7.0 this morning.  Will order 1 more unit PRBCs.  S/p 1 unit PRBCs 10/17.  Hemoglobin showing slow downtrend.  No active bleeding appreciated.  Will recheck CBC in AM.  Severe peripheral arterial disease/peripheral  neuropathy - Pain not well-controlled.  Continues on gabapentin  3 times daily 100 mg.  oxycodone  IR 5 mg from every 4 hours as needed.   Possible urinary tract infection (POA) - UA noting LE, WBCs, bacteria.  Preliminary urine cultures noting possible group B strep.  Currently on ceftriaxone .   Polysubstance abuse - Noted positive tox screen for cocaine on presentation.  Encourage cessation.   Left foot foreign body (POA) - Noted on imaging 11/2023.  Apparently a radiopaque BB as well as concern for a 3 mm needle tip between her 1st and 2nd toe.  Chronicity unclear.  Length of treatment: To patient's ongoing BL LEE pain.  No urgent need for surgical intervention at this time.  Does not appear to be acutely infected.   Goals of care - Patient is homeless.  Will consult with Child psychotherapist.  Working closely with case management and nephrology on disposition planning.  Pt is very anxious about her disposition and medications.  Receiving blood today.  Likely HD tomorrow.  Anticipate discharge Thursday.   Subjective: Patient laying on her side, in mild distress.  Complaining of epigastric abdominal pain.  Denies fever, chills, nausea, vomiting, diarrhea.  Does not want to take pain meds stating they do not work.  Physical Exam:  Vitals:   01/05/24 1919 01/06/24 0833 01/06/24 1212 01/06/24 1240  BP: (!) 168/78 (!) 168/77 132/65 (!) 127/55  Pulse: 86 79 77 71  Resp: 19 18    Temp: 97.8 F (36.6 C) 98.2 F (36.8 C) 98.4 F (36.9 C) 98.3 F (36.8 C)  TempSrc: Oral Axillary Oral Oral  SpO2: 95% 96% 99% 97%  Weight:      Height:        GENERAL:  Alert, pleasant, cachectic HEENT:  EOMI CARDIOVASCULAR:  RRR, no murmurs appreciated RESPIRATORY:  Clear to auscultation, no wheezing, rales, or rhonchi GASTROINTESTINAL: Mildly distended, fluid-filled EXTREMITIES: Thin, trace edema BL LE NEURO:  No new focal deficits appreciated SKIN: Scattered wounds in the lower extremities in various stages  of healing, fresh bandages covering PSYCH:  Appropriate mood and affect, anxious   Data Reviewed:  Imaging Studies: IR Paracentesis Result Date: 01/02/2024 INDICATION: 63 year old female with history of HFpEF/ESRD with volume overload, admitted for acute respiratory failure, and diagnosed with new onset ascites. IR was requested for diagnostic and therapeutic paracentesis. EXAM: ULTRASOUND GUIDED DIAGNOSTIC AND THERAPEUTIC PARACENTESIS MEDICATIONS: 5 cc of 1% lidocaine  with epi. COMPLICATIONS: None immediate. PROCEDURE: Informed written consent was obtained from the patient after a discussion of the risks, benefits and alternatives to treatment. A timeout was performed prior to the initiation of the procedure. Initial ultrasound scanning demonstrates a large amount of ascites within the right lower abdominal quadrant. The right lower abdomen was prepped and draped in the usual sterile fashion. 1% lidocaine  was used for local anesthesia. Following this, a 19 gauge, 7-cm, Yueh catheter was introduced. An ultrasound image was saved for documentation purposes. The paracentesis was performed. The catheter was removed and a dressing was applied. The patient tolerated the procedure well without immediate post procedural complication. FINDINGS: A total of approximately 2.8 L of clear, straw-colored peritoneal fluid was removed. Samples were sent to the laboratory as requested by the clinical team. IMPRESSION: Successful ultrasound-guided paracentesis yielding 2.8 liters of peritoneal fluid. Procedure performed by Carlin Griffon, PA-C Electronically Signed   By: Cordella Banner   On: 01/02/2024 16:56   CT Angio Chest/Abd/Pel for Dissection W and/or Wo Contrast Result Date: 12/30/2023 EXAM: CT CHEST, ABDOMEN AND PELVIS WITH AND WITHOUT CONTRAST 12/30/2023 03:51:10 PM TECHNIQUE: CT of the chest, abdomen and pelvis was performed with and without the administration of 100 mL of iohexol  (OMNIPAQUE ) 350 MG/ML  injection. Multiplanar reformatted images are provided for review. Automated exposure control, iterative reconstruction, and/or weight based adjustment of the mA/kV was utilized to reduce the radiation dose to as low as reasonably achievable. COMPARISON: 10/23/2023 CLINICAL HISTORY: Aortic aneurysm suspected. Shortness of Breath; CT Angio Chest/Abd/Pel for Dissection W and/or Wo Contrast; Aortic aneurysm suspected. Omni 350 ; Pt bib [facility] coming [facility]. Pt has missed dialysis for about a month and states she has been unable to go due to not having the resources. Pt abdomen distended and pt c/o shortness of breath and lower back pain. FINDINGS: CHEST: MEDIASTINUM AND LYMPH NODES: Mild cardiomegaly. Coronary artery calcifications are noted. The central airways are clear. No mediastinal, hilar or axillary lymphadenopathy. LUNGS AND PLEURA: No focal consolidation or pulmonary edema. Small left pleural effusion is noted. Minimal right pleural effusion. Minimal bibasilar subsegmental atelectasis. Minimal emphysematous disease. No pneumothorax. ABDOMEN AND PELVIS: LIVER: Probable hepatic cirrhosis. GALLBLADDER AND BILE DUCTS: Gallbladder is unremarkable. No biliary ductal dilatation. SPLEEN: No acute abnormality. PANCREAS: No acute abnormality. ADRENAL GLANDS: No acute abnormality. KIDNEYS, URETERS AND BLADDER: Severe stenosis involving the proximal left renal artery. The right renal artery is not well opacified. No stones in the kidneys or ureters. No hydronephrosis. No perinephric or periureteral stranding. Urinary bladder is unremarkable. GI AND BOWEL: Stomach demonstrates no acute abnormality. Severe narrowing at the origin of the inferior mesenteric artery. There is no bowel obstruction. REPRODUCTIVE ORGANS: No  acute abnormality. PERITONEUM AND RETROPERITONEUM: Moderate ascites. No free air. VASCULATURE: Aortic atherosclerosis. Occlusion of proximal portions of both superficial femoral  arteries. Moderate stenosis of the origin of the right common iliac artery secondary to calcified plaque. Severe stenosis involving the proximal left renal artery. The right renal artery is not well opacified. Severe narrowing at the origin of the inferior mesenteric artery. ABDOMINAL AND PELVIS LYMPH NODES: No lymphadenopathy. REPRODUCTIVE ORGANS: No acute abnormality. BONES AND SOFT TISSUES: No acute osseous abnormality. No focal soft tissue abnormality. IMPRESSION: 1. No evidence of aortic dissection or aneurysm. 2. Severe stenosis of the proximal left renal artery. Right renal artery not well opacified. 3. Severe narrowing at the origin of the inferior mesenteric artery. 4. Moderate stenosis at the origin of the right common iliac artery due to calcified plaque. 5. Occlusion of the proximal portions of both superficial femoral arteries. 6. Probable hepatic cirrhosis with moderate ascites. Electronically signed by: Lynwood Seip MD 12/30/2023 04:09 PM EDT RP Workstation: [id number]   DG Chest Port 1 View Result Date: 12/30/2023 CLINICAL DATA:  Questionable sepsis - evaluate for abnormality EXAM: PORTABLE CHEST - 1 VIEW COMPARISON:  11/24/2023 FINDINGS: Biapical pleural thickening. Small right pleural effusion with right basilar atelectasis. No new airspace consolidation or pneumothorax. Mild cardiomegaly. Tortuous aorta with aortic atherosclerosis. No acute fracture or destructive lesions. Multilevel thoracic osteophytosis. IMPRESSION: Mild cardiomegaly with small right pleural effusion. Electronically Signed   By: Rogelia Myers M.D.   On: 12/30/2023 14:28    Results are pending, will review when available.  Previous records (including but not limited to H&P, progress notes, nursing notes, TOC management) were reviewed in assessment of this patient.  Labs: CBC: Recent Labs  Lab 01/02/24 0313 01/03/24 0840 01/04/24 0403 01/05/24 0854 01/06/24 0322 01/06/24 0640  WBC 6.0 5.3 5.6 7.1 6.6  --    HGB 8.2* 7.6* 7.4* 7.3* 6.9* 7.0*  HCT 24.2* 23.0* 22.6* 22.2* 21.7* 21.9*  MCV 90.3 93.1 94.6 95.3 96.0  --   PLT 290 246 240 240 237  --    Basic Metabolic Panel: Recent Labs  Lab 12/31/23 0442 01/01/24 0326 01/02/24 0313 01/03/24 0840 01/04/24 0403 01/05/24 0854 01/06/24 0322  NA 131* 132* 130* 129* 128* 126* 129*  K 4.2 3.7 4.0 4.2 4.0 4.2 4.3  CL 104 97* 94* 94* 93* 91* 93*  CO2 14* 21* 21* 24 25 20* 25  GLUCOSE 105* 98 101* 83 101* 109* 92  BUN 56* 28* 35* 17 30* 38* 22  CREATININE 6.37* 4.22* 4.49* 2.81* 3.90* 4.48* 2.98*  CALCIUM  7.6* 7.6* 8.0* 8.0* 8.2* 8.3* 8.3*  MG 1.7 1.7 1.8 1.7 1.8  --  1.8  PHOS 7.1*  --  3.6 3.1  --   --  3.2   Liver Function Tests: Recent Labs  Lab 12/31/23 0442 01/03/24 0840 01/04/24 0403 01/05/24 0854 01/06/24 0322  AST 15 32 33 25  --   ALT 7 15 16 15   --   ALKPHOS 77 73 70 82  --   BILITOT 0.4 0.5 0.8 1.0  --   PROT 5.5* 5.6* 5.4* 5.8*  --   ALBUMIN  2.3* 2.2* 2.1* 2.3* 2.2*   CBG: No results for input(s): GLUCAP in the last 168 hours.  Scheduled Meds:  amLODipine   10 mg Oral Daily   atorvastatin   10 mg Oral Daily   calcitRIOL   0.25 mcg Oral Daily   carvedilol   6.25 mg Oral BID WC   Chlorhexidine  Gluconate Cloth  6  each Topical Q0600   Chlorhexidine  Gluconate Cloth  6 each Topical Q0600   Chlorhexidine  Gluconate Cloth  6 each Topical Q0600   Chlorhexidine  Gluconate Cloth  6 each Topical Q0600   docusate sodium   100 mg Oral BID   ferrous sulfate   325 mg Oral Q breakfast   gabapentin   100 mg Oral TID   guaiFENesin   600 mg Oral BID   heparin   5,000 Units Subcutaneous Q8H   pantoprazole   40 mg Oral BID   polyethylene glycol  17 g Oral Daily   sodium bicarbonate   1,300 mg Oral BID   sodium chloride  flush  3 mL Intravenous Q12H   sucralfate   1 g Oral TID WC & HS   traZODone   50 mg Oral QHS   Continuous Infusions:  cefTRIAXone  (ROCEPHIN )  IV Stopped (01/05/24 2003)   PRN Meds:.acetaminophen  **OR** acetaminophen ,  albuterol , ALPRAZolam , alum & mag hydroxide-simeth, bisacodyl , ondansetron  **OR** ondansetron  (ZOFRAN ) IV, oxyCODONE , sodium chloride  flush  Family Communication: None at bedside  Disposition: Status is: Inpatient Remains inpatient appropriate because: ESRD     Time spent: 52 minutes  Length of inpatient stay: 7 days  Author: Carliss LELON Canales, DO 01/06/2024 1:20 PM  For on call review [email or web address].

## 2024-01-07 ENCOUNTER — Inpatient Hospital Stay (HOSPITAL_COMMUNITY)

## 2024-01-07 LAB — CULTURE, BODY FLUID W GRAM STAIN -BOTTLE: Culture: NO GROWTH

## 2024-01-07 LAB — RENAL FUNCTION PANEL
Albumin: 2.3 g/dL — ABNORMAL LOW (ref 3.5–5.0)
Anion gap: 11 (ref 5–15)
BUN: 33 mg/dL — ABNORMAL HIGH (ref 8–23)
CO2: 23 mmol/L (ref 22–32)
Calcium: 8.6 mg/dL — ABNORMAL LOW (ref 8.9–10.3)
Chloride: 91 mmol/L — ABNORMAL LOW (ref 98–111)
Creatinine, Ser: 3.9 mg/dL — ABNORMAL HIGH (ref 0.44–1.00)
GFR, Estimated: 12 mL/min — ABNORMAL LOW (ref >60)
Glucose, Bld: 89 mg/dL (ref 70–99)
Phosphorus: 4.5 mg/dL (ref 2.5–4.6)
Potassium: 4.8 mmol/L (ref 3.5–5.1)
Sodium: 125 mmol/L — ABNORMAL LOW (ref 135–145)

## 2024-01-07 LAB — TYPE AND SCREEN
ABO/RH(D): A POS
Antibody Screen: NEGATIVE
Unit division: 0

## 2024-01-07 LAB — CBC
HCT: 24.8 % — ABNORMAL LOW (ref 36.0–46.0)
Hemoglobin: 8.1 g/dL — ABNORMAL LOW (ref 12.0–15.0)
MCH: 30.7 pg (ref 26.0–34.0)
MCHC: 32.7 g/dL (ref 30.0–36.0)
MCV: 93.9 fL (ref 80.0–100.0)
Platelets: 230 K/uL (ref 150–400)
RBC: 2.64 MIL/uL — ABNORMAL LOW (ref 3.87–5.11)
RDW: 16.5 % — ABNORMAL HIGH (ref 11.5–15.5)
WBC: 6.3 K/uL (ref 4.0–10.5)
nRBC: 0 % (ref 0.0–0.2)

## 2024-01-07 LAB — BPAM RBC
Blood Product Expiration Date: 202511142359
ISSUE DATE / TIME: 202510211159
Unit Type and Rh: 6200

## 2024-01-07 LAB — LIPASE, FLUID: Lipase-Fluid: 17 U/L

## 2024-01-07 LAB — TOTAL BILIRUBIN, BODY FLUID: Total bilirubin, fluid: <0.2 mg/dL

## 2024-01-07 MED ORDER — HYDROMORPHONE HCL 1 MG/ML IJ SOLN
0.5000 mg | Freq: Once | INTRAMUSCULAR | Status: AC
  Administered 2024-01-07: 0.5 mg via INTRAVENOUS
  Filled 2024-01-07: qty 0.5

## 2024-01-07 MED ORDER — FENTANYL CITRATE (PF) 50 MCG/ML IJ SOSY
PREFILLED_SYRINGE | INTRAMUSCULAR | Status: AC
  Filled 2024-01-07: qty 1

## 2024-01-07 MED ORDER — FENTANYL CITRATE (PF) 50 MCG/ML IJ SOSY
12.5000 ug | PREFILLED_SYRINGE | INTRAMUSCULAR | Status: AC | PRN
  Administered 2024-01-07 (×3): 12.5 ug via INTRAVENOUS
  Filled 2024-01-07 (×2): qty 1

## 2024-01-07 MED ORDER — POLYETHYLENE GLYCOL 3350 17 G PO PACK
17.0000 g | PACK | Freq: Two times a day (BID) | ORAL | Status: DC
  Administered 2024-01-07 – 2024-01-15 (×12): 17 g via ORAL
  Filled 2024-01-07 (×15): qty 1

## 2024-01-07 MED ORDER — LORAZEPAM 2 MG/ML IJ SOLN
0.5000 mg | INTRAMUSCULAR | Status: AC | PRN
  Administered 2024-01-07 – 2024-01-08 (×2): 0.5 mg via INTRAVENOUS
  Filled 2024-01-07 (×2): qty 1

## 2024-01-07 MED ORDER — ONDANSETRON HCL 4 MG/2ML IJ SOLN
INTRAMUSCULAR | Status: AC
  Filled 2024-01-07: qty 2

## 2024-01-07 MED ORDER — BISACODYL 5 MG PO TBEC
10.0000 mg | DELAYED_RELEASE_TABLET | Freq: Two times a day (BID) | ORAL | Status: DC
  Administered 2024-01-07 – 2024-01-16 (×13): 10 mg via ORAL
  Filled 2024-01-07 (×16): qty 2

## 2024-01-07 MED ORDER — OXYCODONE HCL 5 MG PO TABS
ORAL_TABLET | ORAL | Status: AC
  Filled 2024-01-07: qty 1

## 2024-01-07 MED ORDER — LIDOCAINE-EPINEPHRINE 1 %-1:100000 IJ SOLN
INTRAMUSCULAR | Status: AC
  Filled 2024-01-07: qty 1

## 2024-01-07 MED ORDER — DARBEPOETIN ALFA 100 MCG/0.5ML IJ SOSY
100.0000 ug | PREFILLED_SYRINGE | Freq: Once | INTRAMUSCULAR | Status: DC
  Filled 2024-01-07: qty 0.5

## 2024-01-07 NOTE — Plan of Care (Signed)

## 2024-01-07 NOTE — Progress Notes (Signed)
 [facility] Progress Note   Subjective:   Seen in room, eating breakfast. Reports 13/10 abdominal pain but is currently calm/comfortable. Advised her primary MD will follow up regarding CT scan. Denies SOB, CP, dizziness, nausea.   Objective Vitals:   01/06/24 1546 01/06/24 1938 01/07/24 0028 01/07/24 0527  BP:  (!) 176/68 (!) 157/73   Pulse: (P) 79 75    Resp: (P) 16 18 18 18   Temp: (P) 97.8 F (36.6 C) 98 F (36.7 C) 98.7 F (37.1 C) 98.8 F (37.1 C)  TempSrc: (P) Oral Oral Oral Oral  SpO2: (P) 96% 98%    Weight:      Height:       Physical Exam General: Alert female, eating breakfast, NAD Heart: RRR, no murmurs, rubs or gallops Lungs: CTA bilaterally, respirations unlabored on RA Abdomen: + ascities, exam deferred d/t pain Extremities: no edema b/l lower extremities Dialysis Access: AVF +T/b  Additional Objective Labs: Basic Metabolic Panel: Recent Labs  Lab 01/03/24 0840 01/04/24 0403 01/05/24 0854 01/06/24 0322 01/07/24 0327  NA 129*   < > 126* 129* 125*  K 4.2   < > 4.2 4.3 4.8  CL 94*   < > 91* 93* 91*  CO2 24   < > 20* 25 23  GLUCOSE 83   < > 109* 92 89  BUN 17   < > 38* 22 33*  CREATININE 2.81*   < > 4.48* 2.98* 3.90*  CALCIUM  8.0*   < > 8.3* 8.3* 8.6*  PHOS 3.1  --   --  3.2 4.5   < > = values in this interval not displayed.   Liver Function Tests: Recent Labs  Lab 01/03/24 0840 01/04/24 0403 01/05/24 0854 01/06/24 0322 01/07/24 0327  AST 32 33 25  --   --   ALT 15 16 15   --   --   ALKPHOS 73 70 82  --   --   BILITOT 0.5 0.8 1.0  --   --   PROT 5.6* 5.4* 5.8*  --   --   ALBUMIN  2.2* 2.1* 2.3* 2.2* 2.3*   No results for input(s): LIPASE, AMYLASE in the last 168 hours. CBC: Recent Labs  Lab 01/03/24 0840 01/04/24 0403 01/05/24 0854 01/06/24 0322 01/06/24 0640 01/07/24 0327  WBC 5.3 5.6 7.1 6.6  --  6.3  HGB 7.6* 7.4* 7.3* 6.9* 7.0* 8.1*  HCT 23.0* 22.6* 22.2* 21.7* 21.9* 24.8*  MCV 93.1 94.6 95.3 96.0  --   93.9  PLT 246 240 240 237  --  230   Blood Culture    Component Value Date/Time   SDES PERITONEAL 01/02/2024 1152   SDES PERITONEAL 01/02/2024 1152   SPECREQUEST NONE 01/02/2024 1152   SPECREQUEST NONE 01/02/2024 1152   CULT  01/02/2024 1152    NO GROWTH 5 DAYS Performed at [facility], [address] [place], [place] [address]    REPTSTATUS 01/07/2024 FINAL 01/02/2024 1152   REPTSTATUS 01/02/2024 FINAL 01/02/2024 1152    Cardiac Enzymes: No results for input(s): CKTOTAL, CKMB, CKMBINDEX, TROPONINI in the last 168 hours. CBG: No results for input(s): GLUCAP in the last 168 hours. Iron  Studies: No results for input(s): IRON , TIBC, TRANSFERRIN, FERRITIN in the last 72 hours. @lablastinr3 @ Studies/Results: CT ABDOMEN PELVIS WO CONTRAST Result Date: 01/06/2024 EXAM: CT ABDOMEN AND PELVIS WITHOUT CONTRAST 01/06/2024 04:24:55 PM TECHNIQUE: CT of the abdomen and pelvis was performed without the administration of intravenous contrast. Multiplanar reformatted images are provided  for review. Automated exposure control, iterative reconstruction, and/or weight-based adjustment of the mA/kV was utilized to reduce the radiation dose to as low as reasonably achievable. COMPARISON: Comparison with 12/30/2023. CLINICAL HISTORY: Abdominal pain, acute, nonlocalized. FINDINGS: LOWER CHEST: Small left greater than right pleural effusions. Associated basilar atelectasis or pneumonia. LIVER: Nodular Hepatic contour compatible with cirrhosis. GALLBLADDER AND BILE DUCTS: Gallbladder is unremarkable. No biliary ductal dilatation. SPLEEN: No acute abnormality. PANCREAS: No acute abnormality. ADRENAL GLANDS: No acute abnormality. 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 No stones in the kidneys or ureters. No hydronephrosis. No perinephric or periureteral stranding. Urinary bladder is unremarkable. GI AND BOWEL: Distended stomach. Diffuse dilation of the small bowel.  No abrupt transition. Gradual dilution of enteric contrast in the small bowel. Fecalization of the distal ileum. Findings are compatible with slow transit / ileus. Moderate to large colonic stool burden. Normal appendix. PERITONEUM AND RETROPERITONEUM: Moderate abdominal pelvic ascites decreased from 12/30/2023. No free air. VASCULATURE: Aorta is normal in caliber. Aortic atherosclerotic calcification. LYMPH NODES: No lymphadenopathy. REPRODUCTIVE ORGANS: No acute abnormality. 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 No acute osseous abnormality. Body wall edema. IMPRESSION: 1. Findings compatible with slow transit/ileus  and constipation. 2. Moderate abdominopelvic ascites, decreased from 12/30/23. 3. Small left greater than right pleural effusions with associated basilar atelectasis or pneumonia. 4. Cirrhosis. Electronically signed by: Norman Gatlin MD 01/06/2024 08:26 PM EDT RP Workstation: [id number]   Medications:  cefTRIAXone  (ROCEPHIN )  IV 200 mL/hr at 01/06/24 1800    amLODipine   10 mg Oral Daily   atorvastatin   10 mg Oral Daily   calcitRIOL   0.25 mcg Oral Daily   carvedilol   6.25 mg Oral BID WC   Chlorhexidine  Gluconate Cloth  6 each Topical Q0600   Chlorhexidine  Gluconate Cloth  6 each Topical Q0600   Chlorhexidine  Gluconate Cloth  6 each Topical Q0600   Chlorhexidine  Gluconate Cloth  6 each Topical Q0600   docusate sodium   100 mg Oral BID   ferrous sulfate   325 mg Oral Q breakfast   gabapentin   100 mg Oral TID   guaiFENesin   600 mg Oral BID   heparin   5,000 Units Subcutaneous Q8H   pantoprazole   40 mg Oral BID   polyethylene glycol  17 g Oral Daily   sodium bicarbonate   1,300 mg Oral BID   sodium chloride  flush  3 mL Intravenous Q12H   sucralfate   1 g Oral TID WC & HS   traZODone   50 mg Oral QHS    Assessment/Plan: SOB/ volume overload: no edema initially per CXR, though pt was vol overloaded. Severe ascites perhaps contributed to SOB. S/p  thoracentesis and multiple HD treatments, breathing is improved, Ascites:  IR did paracentesis for 2.8 L on 10/17.  ESRD: no OP HD unit due to unstable housing/transportation issues. Comes to hospital for HD when feeling bad. MWF schedule for now. SW is on board HTN/ vol: bp's high, getting norvasc / coreg  here as at home. UF with HD as tolerated.  Anemia of esrd: Hb was low in 6-8 range on admit. Sp PRBC's. Sporadic HD so is not getting correct ESA doses. Tsat 30% on 10/15, no IV fe needed.   Lucie Collet, PA-C 01/07/2024, 9:09 AM  [facility] Pager: [phone]

## 2024-01-07 NOTE — Progress Notes (Signed)
 Pt in flouroscopy, staff asked for 4ERN to administer ordered prn ativan  for placement of NGT.  IV ativan  administered, however pt screaming and refusing to allow NGT attempt unless she has IV dilaudid  or IV morphine .  IV ativan  given about 10 minutes and pt continued to yell and refuse treatment. Dr. Caleen called and notified of pt's request, for which I received one time order for IV dilaudid .  Pulled from 4E pyxis and administered in flouroscopy.

## 2024-01-07 NOTE — Procedures (Signed)
 Patient seen on Hemodialysis. BP (!) 159/74   Pulse 73   Temp 97.8 F (36.6 C)   Resp (!) 9   Ht 5' 7 (1.702 m)   Wt 55.7 kg   SpO2 100%   BMI 19.23 kg/m   QB 300, UF goal 0.5L Tolerating treatment with complaints of abdominal pain at this time that may need to be truncated soon. To have additional paracentesis and evaluation for ileus.    Gordy Blanch MD [facility]. Office # [phone] Pager # [phone] 10:01 AM

## 2024-01-07 NOTE — Hospital Course (Addendum)
 63 y.o. female with PMH significant for ESRD on hemodialysis, Noncompliant with dialysis, homeless, COPD, Chronic diastolic CHF, Chronic pain syndrome, history of polysubstance use including cocaine, depression, GERD, alcohol  use, chronic tobacco use presented in the ED with abdominal distention and shortness of breath.    Assessment & Plan:  Acute epigastric pain with distention/Ileus Severe constipation - CT scan showing ileus/constipation.   - NG tube placed on 10/22 which patient has already removed.  Having bowel movements and slowly improving.  Continue aggressive bowel regimen - Continue soft diet  Ascites - No known history of ascites in the past but noted abdominal distention on presentation.  S/p paracentesis 10/17 with 2.8 L removed.   - Repeat attempt 10/22, no evidence of ascites - Continue volume control with dialysis  Acute hypoxic respiratory failure-resolved - Secondary to volume overload.  Initially placed on 3 L nasal cannula.  Able to be weaned down to room air   Acute exacerbation HFpEF/ESRD/volume overload - Noncompliant with HD the outpatient setting.  Homeless, comes to ED for HD periodically.  Nephrology on board.   COPD - Complaining of some wheezing and shortness of breath - Continue DuoNebs, budesonide , Brovana    Anemia of chronic kidney disease -Required 2 units PRBC so far during hospitalization - Hgb back down to 7 g/dL this morning -Trend 1 more day and if still low or lower tomorrow, may need transfusion -No obvious bleeding.  Etiology attributed to renal failure   Severe peripheral arterial disease/peripheral neuropathy - Pain not well-controlled. - Continue gabapentin  -Modifying oxycodone  for now -Change Tylenol  to scheduled   Possible urinary tract infection (POA) - UA noting LE, WBCs, bacteria.  Urine culture grew GBS.  Completed Rocephin  course   Polysubstance abuse - Noted positive tox screen for cocaine on presentation.  Encourage  cessation.   Left foot foreign body (POA) - Noted on imaging 11/2023.  Apparently a radiopaque BB as well as concern for a 3 mm needle tip between her 1st and 2nd toe.  Chronicity unclear.  Length of treatment: To patient's ongoing BL LEE pain.  No urgent need for surgical intervention at this time.  Does not appear to be acutely infected.   Goals of care - Patient is homeless.  TOC following

## 2024-01-07 NOTE — Progress Notes (Signed)
 PROGRESS NOTE    [name]  FMW:[id number] DOB: December 06, 1960 DOA: 12/30/2023 PCP: Pcp, No    Brief Narrative:   63 y.o. female with PMH significant for ESRD on hemodialysis, Noncompliant with dialysis, homeless, COPD, Chronic diastolic CHF, Chronic pain syndrome, history of polysubstance use including cocaine, depression, GERD, alcohol  use, chronic tobacco use presented in the ED with abdominal distention and shortness of breath.    Assessment & Plan:   Acute hypoxic respiratory failure - Secondary to volume overload.  Initially placed on 3 L nasal cannula.  Able to be weaned down to room air.  Resolved.   Acute exacerbation HFpEF/ESRD/volume overload - Noncompliant with HD the outpatient setting.  Homeless, comes to ED for HD periodically.  Nephrology on board  Requested Renal to cut her HD short if possible.    Acute epigastric pain with distention/Ileus Severe constipation - Complaining of epigastric pain this morning.  No vomiting, diarrhea.  Refusing pain medications.  Currently already has Protonix , sucralfate  on board.  Differential include underlying chronic gastritis, exacerbation from ascites, or even ischemic gastritis.  CT scan is showing ileus/constipation. - Repeat AXR, IR consult for Para and NGT to low suction. Made her NPO   Ascites - No known history of ascites in the past but noted abdominal distention on presentation.  S/p paracentesis 10/17 with 2.8 L removed.  Studies not suggestive of infection.  Starting to show signs of reaccumulation.  May possibly pursue therapeutic paracentesis prior to DC.   COPD - Inhalers/nebulizers on board.  Wean O2 as above.  Does not appear to be in acute exacerbation.   Anemia of chronic kidney disease - Hemoglobin 6.9/7.0 this morning.  Will order 1 more unit PRBCs.  S/p 1 unit PRBCs 10/17.  Hemoglobin showing slow downtrend.  No active bleeding appreciated.  Will recheck CBC in AM.   Severe peripheral arterial  disease/peripheral neuropathy - Pain not well-controlled.  Continues on gabapentin  3 times daily 100 mg.  oxycodone  IR 5 mg from every 4 hours as needed.   Possible urinary tract infection (POA) - UA noting LE, WBCs, bacteria.  Preliminary urine cultures noting possible group B strep.  Currently on ceftriaxone .   Polysubstance abuse - Noted positive tox screen for cocaine on presentation.  Encourage cessation.   Left foot foreign body (POA) - Noted on imaging 11/2023.  Apparently a radiopaque BB as well as concern for a 3 mm needle tip between her 1st and 2nd toe.  Chronicity unclear.  Length of treatment: To patient's ongoing BL LEE pain.  No urgent need for surgical intervention at this time.  Does not appear to be acutely infected.   Goals of care - Patient is homeless.  TOC following   DVT prophylaxis: heparin  injection 5,000 Units Start: 12/30/23 1715      Code Status: Limited: Do not attempt resuscitation (DNR) -DNR-LIMITED -Do Not Intubate/DNI  Family Communication:   Status is: Inpatient Remains inpatient appropriate because: Hopefully discharge once she is able to tolerate orals   PT Follow up Recs:   Subjective: Complaints of significant abd pain  while on HD Abd is distended   Examination:  General exam: Appears calm and comfortable  Respiratory system: Clear to auscultation. Respiratory effort normal. Cardiovascular system: S1 & S2 heard, RRR. No JVD, murmurs, rubs, gallops or clicks. No pedal edema. Gastrointestinal system: Abdomen is tense and distended.  Central nervous system: Alert and oriented. No focal neurological deficits. Extremities: Symmetric 5 x 5 power. Skin: No rashes,  lesions or ulcers Psychiatry: Judgement and insight appear normal. Mood & affect appropriate.                Diet Orders (From admission, onward)     Start     Ordered   01/07/24 0941  Diet NPO time specified  Diet effective now        01/07/24 0940             Objective: Vitals:   01/07/24 0527 01/07/24 0908 01/07/24 0936 01/07/24 1024  BP:  (!) 157/88 (!) 159/74 (!) 160/77  Pulse:  72 73 71  Resp: 18 18 (!) 9 11  Temp: 98.8 F (37.1 C) 97.8 F (36.6 C)    TempSrc: Oral     SpO2:   100% 100%  Weight:  55.7 kg    Height:        Intake/Output Summary (Last 24 hours) at 01/07/2024 1034 Last data filed at 01/06/2024 1800 Gross per 24 hour  Intake 550.17 ml  Output --  Net 550.17 ml   Filed Weights   01/05/24 1446 01/05/24 1828 01/07/24 0908  Weight: 53.2 kg 50.8 kg 55.7 kg    Scheduled Meds:  amLODipine   10 mg Oral Daily   atorvastatin   10 mg Oral Daily   bisacodyl   10 mg Oral BID   calcitRIOL   0.25 mcg Oral Daily   carvedilol   6.25 mg Oral BID WC   Chlorhexidine  Gluconate Cloth  6 each Topical Q0600   Chlorhexidine  Gluconate Cloth  6 each Topical Q0600   Chlorhexidine  Gluconate Cloth  6 each Topical Q0600   Chlorhexidine  Gluconate Cloth  6 each Topical Q0600   docusate sodium   100 mg Oral BID   ferrous sulfate   325 mg Oral Q breakfast   gabapentin   100 mg Oral TID   guaiFENesin   600 mg Oral BID   heparin   5,000 Units Subcutaneous Q8H   pantoprazole   40 mg Oral BID   polyethylene glycol  17 g Oral BID   sodium bicarbonate   1,300 mg Oral BID   sodium chloride  flush  3 mL Intravenous Q12H   sucralfate   1 g Oral TID WC & HS   traZODone   50 mg Oral QHS   Continuous Infusions:  Nutritional status     Body mass index is 19.23 kg/m.  Data Reviewed:   CBC: Recent Labs  Lab 01/03/24 0840 01/04/24 0403 01/05/24 0854 01/06/24 0322 01/06/24 0640 01/07/24 0327  WBC 5.3 5.6 7.1 6.6  --  6.3  HGB 7.6* 7.4* 7.3* 6.9* 7.0* 8.1*  HCT 23.0* 22.6* 22.2* 21.7* 21.9* 24.8*  MCV 93.1 94.6 95.3 96.0  --  93.9  PLT 246 240 240 237  --  230   Basic Metabolic Panel: Recent Labs  Lab 01/01/24 0326 01/02/24 0313 01/03/24 0840 01/04/24 0403 01/05/24 0854 01/06/24 0322 01/07/24 0327  NA 132* 130* 129* 128* 126* 129*  125*  K 3.7 4.0 4.2 4.0 4.2 4.3 4.8  CL 97* 94* 94* 93* 91* 93* 91*  CO2 21* 21* 24 25 20* 25 23  GLUCOSE 98 101* 83 101* 109* 92 89  BUN 28* 35* 17 30* 38* 22 33*  CREATININE 4.22* 4.49* 2.81* 3.90* 4.48* 2.98* 3.90*  CALCIUM  7.6* 8.0* 8.0* 8.2* 8.3* 8.3* 8.6*  MG 1.7 1.8 1.7 1.8  --  1.8  --   PHOS  --  3.6 3.1  --   --  3.2 4.5   GFR: Estimated Creatinine Clearance: 13 mL/min (  A) (by C-G formula based on SCr of 3.9 mg/dL (H)). Liver Function Tests: Recent Labs  Lab 01/03/24 0840 01/04/24 0403 01/05/24 0854 01/06/24 0322 01/07/24 0327  AST 32 33 25  --   --   ALT 15 16 15   --   --   ALKPHOS 73 70 82  --   --   BILITOT 0.5 0.8 1.0  --   --   PROT 5.6* 5.4* 5.8*  --   --   ALBUMIN  2.2* 2.1* 2.3* 2.2* 2.3*   No results for input(s): LIPASE, AMYLASE in the last 168 hours. No results for input(s): AMMONIA in the last 168 hours. Coagulation Profile: No results for input(s): INR, PROTIME in the last 168 hours. Cardiac Enzymes: No results for input(s): CKTOTAL, CKMB, CKMBINDEX, TROPONINI in the last 168 hours. BNP (last 3 results) No results for input(s): PROBNP in the last 8760 hours. HbA1C: No results for input(s): HGBA1C in the last 72 hours. CBG: No results for input(s): GLUCAP in the last 168 hours. Lipid Profile: No results for input(s): CHOL, HDL, LDLCALC, TRIG, CHOLHDL, LDLDIRECT in the last 72 hours. Thyroid Function Tests: No results for input(s): TSH, T4TOTAL, FREET4, T3FREE, THYROIDAB in the last 72 hours. Anemia Panel: No results for input(s): VITAMINB12, FOLATE, FERRITIN, TIBC, IRON , RETICCTPCT in the last 72 hours. Sepsis Labs: No results for input(s): PROCALCITON, LATICACIDVEN in the last 168 hours.  Recent Results (from the past 240 hours)  Blood Culture (routine x 2)     Status: None   Collection Time: 12/30/23 12:25 PM   Specimen: BLOOD  Result Value Ref Range Status   Specimen Description  BLOOD LEFT ANTECUBITAL  Final   Special Requests   Final    BOTTLES DRAWN AEROBIC AND ANAEROBIC Blood Culture adequate volume   Culture   Final    NO GROWTH 5 DAYS Performed at [facility], [address], [place], [place] [address]    Report Status 01/04/2024 FINAL  Final  Resp panel by RT-PCR (RSV, Flu A&B, Covid) Anterior Nasal Swab     Status: None   Collection Time: 12/30/23 12:25 PM   Specimen: Anterior Nasal Swab  Result Value Ref Range Status   SARS Coronavirus 2 by RT PCR NEGATIVE NEGATIVE Final   Influenza A by PCR NEGATIVE NEGATIVE Final   Influenza B by PCR NEGATIVE NEGATIVE Final    Comment: (NOTE) The Xpert Xpress SARS-CoV-2/FLU/RSV plus assay is intended as an aid in the diagnosis of influenza from Nasopharyngeal swab specimens and should not be used as a sole basis for treatment. Nasal washings and aspirates are unacceptable for Xpert Xpress SARS-CoV-2/FLU/RSV testing.  Fact Sheet for Patients: BloggerCourse.com  Fact Sheet for Healthcare Providers: SeriousBroker.it  This test is not yet approved or cleared by the [place]  FDA and has been authorized for detection and/or diagnosis of SARS-CoV-2 by FDA under an Emergency Use Authorization (EUA). This EUA will remain in effect (meaning this test can be used) for the duration of the COVID-19 declaration under Section 564(b)(1) of the Act, 21 U.S.C. section 360bbb-3(b)(1), unless the authorization is terminated or revoked.     Resp Syncytial Virus by PCR NEGATIVE NEGATIVE Final    Comment: (NOTE) Fact Sheet for Patients: BloggerCourse.com  Fact Sheet for Healthcare Providers: SeriousBroker.it  This test is not yet approved or cleared by the [place]  FDA and has been authorized for detection and/or diagnosis of SARS-CoV-2 by FDA under an Emergency Use Authorization (EUA). This EUA will  remain in  effect (meaning this test can be used) for the duration of the COVID-19 declaration under Section 564(b)(1) of the Act, 21 U.S.C. section 360bbb-3(b)(1), unless the authorization is terminated or revoked.  Performed at [facility], [address], [place], [place] [address]   Urine Culture     Status: Abnormal   Collection Time: 12/30/23  1:05 PM   Specimen: Urine, Random  Result Value Ref Range Status   Specimen Description URINE, RANDOM  Final   Special Requests NONE Reflexed from [phone]  Final   Culture (A)  Final    >=100,000 COLONIES/mL GROUP B STREP(S.AGALACTIAE)ISOLATED TESTING AGAINST S. AGALACTIAE NOT ROUTINELY PERFORMED DUE TO PREDICTABILITY OF AMP/PEN/VAN SUSCEPTIBILITY. Performed at [facility], [address], [place], [place] [address]    Report Status 12/31/2023 FINAL  Final  Blood Culture (routine x 2)     Status: None   Collection Time: 12/30/23  5:00 PM   Specimen: BLOOD LEFT HAND  Result Value Ref Range Status   Specimen Description BLOOD LEFT HAND  Final   Special Requests   Final    BOTTLES DRAWN AEROBIC AND ANAEROBIC Blood Culture adequate volume   Culture   Final    NO GROWTH 5 DAYS Performed at [facility], [address], [place], [place] [address]    Report Status 01/04/2024 FINAL  Final  Culture, body fluid w Gram Stain-bottle     Status: None   Collection Time: 01/02/24 11:52 AM   Specimen: Peritoneal Washings  Result Value Ref Range Status   Specimen Description PERITONEAL  Final   Special Requests NONE  Final   Culture   Final    NO GROWTH 5 DAYS Performed at [facility], [address], [place], [place] [address]    Report Status 01/07/2024 FINAL  Final  Gram stain     Status: None   Collection Time: 01/02/24 11:52 AM   Specimen: Peritoneal Washings  Result Value Ref Range Status   Specimen Description PERITONEAL  Final   Special Requests NONE  Final   Gram Stain   Final    WBC PRESENT, PREDOMINANTLY  MONONUCLEAR NO ORGANISMS SEEN CYTOSPIN SMEAR Performed at [facility], [address] [place], [place] [address]    Report Status 01/02/2024 FINAL  Final         Radiology Studies: CT ABDOMEN PELVIS WO CONTRAST Result Date: 01/06/2024 EXAM: CT ABDOMEN AND PELVIS WITHOUT CONTRAST 01/06/2024 04:24:55 PM TECHNIQUE: CT of the abdomen and pelvis was performed without the administration of intravenous contrast. Multiplanar reformatted images are provided for review. Automated exposure control, iterative reconstruction, and/or weight-based adjustment of the mA/kV was utilized to reduce the radiation dose to as low as reasonably achievable. COMPARISON: Comparison with 12/30/2023. CLINICAL HISTORY: Abdominal pain, acute, nonlocalized. FINDINGS: LOWER CHEST: Small left greater than right pleural effusions. Associated basilar atelectasis or pneumonia. LIVER: Nodular Hepatic contour compatible with cirrhosis. GALLBLADDER AND BILE DUCTS: Gallbladder is unremarkable. No biliary ductal dilatation. SPLEEN: No acute abnormality. PANCREAS: No acute abnormality. ADRENAL GLANDS: No acute abnormality. 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 No stones in the kidneys or ureters. No hydronephrosis. No perinephric or periureteral stranding. Urinary bladder is unremarkable. GI AND BOWEL: Distended stomach. Diffuse dilation of the small bowel. No abrupt transition. Gradual dilution of enteric contrast in the small bowel. Fecalization of the distal ileum. Findings are compatible with slow transit / ileus. Moderate to large colonic  stool burden. Normal appendix. PERITONEUM AND RETROPERITONEUM: Moderate abdominal pelvic ascites decreased from 12/30/2023. No free air. VASCULATURE: Aorta is normal in caliber. Aortic atherosclerotic calcification. LYMPH NODES: No lymphadenopathy. REPRODUCTIVE ORGANS: No acute abnormality. 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 No acute  osseous abnormality. Body wall edema. IMPRESSION: 1. Findings compatible with slow transit/ileus  and constipation. 2. Moderate abdominopelvic ascites, decreased from 12/30/23. 3. Small left greater than right pleural effusions with associated basilar atelectasis or pneumonia. 4. Cirrhosis. Electronically signed by: Norman Gatlin MD 01/06/2024 08:26 PM EDT RP Workstation: [id number]           LOS: 8 days   Time spent= 35 mins    Burgess JAYSON Dare, MD [facility]  If 7PM-7AM, please contact night-coverage  01/07/2024, 10:35 AM

## 2024-01-07 NOTE — Progress Notes (Signed)
 500 ML ultrafiltration, nausea during and post hemodialysis.  Zofran  prn and fentanly prn administered as per md orders. Report was given to the primary RN.

## 2024-01-07 NOTE — Progress Notes (Signed)
 Report given to HD nurse Joane PEAK. Pt is ready.

## 2024-01-07 NOTE — Progress Notes (Signed)
 Seen at bedside this afternoon.  No ascites noted during paracentesis.  Successful placement of NGT. Currently on intermittent suction, abd is slightly soft.  Nausea and abd pain improved.  Will need enema now and another later today. Hopefully we can clamp the NGT by tomorrow and start PO bowel regimen as well.   Burgess Dare MD

## 2024-01-07 NOTE — Plan of Care (Signed)
   Problem: Activity: Goal: Risk for activity intolerance will decrease Outcome: Progressing   Problem: Nutrition: Goal: Adequate nutrition will be maintained Outcome: Progressing

## 2024-01-07 NOTE — Progress Notes (Signed)
 Pt known to navigator from past admissions. Pt currently does not have an accepting out-pt HD clinic due to pt's hx of non-compliance. Pt has been set up at two clinics and would not go for treatments and was d/c from clinics. In the past, pt has not been agreeable to an out-pt clinic due to pt's social barriers.   Randine Mungo Dialysis Navigator [phone]

## 2024-01-08 LAB — BASIC METABOLIC PANEL WITH GFR
Anion gap: 11 (ref 5–15)
BUN: 23 mg/dL (ref 8–23)
CO2: 25 mmol/L (ref 22–32)
Calcium: 8.3 mg/dL — ABNORMAL LOW (ref 8.9–10.3)
Chloride: 94 mmol/L — ABNORMAL LOW (ref 98–111)
Creatinine, Ser: 3.59 mg/dL — ABNORMAL HIGH (ref 0.44–1.00)
GFR, Estimated: 14 mL/min — ABNORMAL LOW (ref >60)
Glucose, Bld: 86 mg/dL (ref 70–99)
Potassium: 4.7 mmol/L (ref 3.5–5.1)
Sodium: 130 mmol/L — ABNORMAL LOW (ref 135–145)

## 2024-01-08 LAB — CBC
HCT: 25 % — ABNORMAL LOW (ref 36.0–46.0)
Hemoglobin: 8.1 g/dL — ABNORMAL LOW (ref 12.0–15.0)
MCH: 30.6 pg (ref 26.0–34.0)
MCHC: 32.4 g/dL (ref 30.0–36.0)
MCV: 94.3 fL (ref 80.0–100.0)
Platelets: 248 K/uL (ref 150–400)
RBC: 2.65 MIL/uL — ABNORMAL LOW (ref 3.87–5.11)
RDW: 16.1 % — ABNORMAL HIGH (ref 11.5–15.5)
WBC: 6 K/uL (ref 4.0–10.5)
nRBC: 0 % (ref 0.0–0.2)

## 2024-01-08 MED ORDER — LACTULOSE 10 GM/15ML PO SOLN
30.0000 g | Freq: Two times a day (BID) | ORAL | Status: DC
  Administered 2024-01-08 – 2024-01-10 (×3): 30 g via ORAL
  Filled 2024-01-08 (×8): qty 45

## 2024-01-08 MED ORDER — METOPROLOL TARTRATE 5 MG/5ML IV SOLN: 5.0000 mg | INTRAVENOUS | Status: DC | PRN

## 2024-01-08 MED ORDER — IPRATROPIUM-ALBUTEROL 0.5-2.5 (3) MG/3ML IN SOLN
3.0000 mL | RESPIRATORY_TRACT | Status: DC | PRN
  Administered 2024-01-09 – 2024-01-15 (×4): 3 mL via RESPIRATORY_TRACT
  Filled 2024-01-08 (×4): qty 3

## 2024-01-08 MED ORDER — HYDRALAZINE HCL 20 MG/ML IJ SOLN: 10.0000 mg | INTRAMUSCULAR | Status: DC | PRN

## 2024-01-08 NOTE — Progress Notes (Signed)
 Patient ID: [name], female   DOB: Jun 30, 1960, 63 y.o.   MRN: [id number] [facility] Progress Note   Assessment/ Plan:   1.  Acute hypoxic respiratory failure: Appears to be in part from volume overload and decreased respiratory excursion secondary to severe ascites.  Improved status post hemodialysis/ultrafiltration and previous paracentesis. 2. ESRD: Typically on MWF schedule while here in the hospital; she does not have an outpatient dialysis unit because of nonadherence with keeping scheduled appointments.  Next hemodialysis will be ordered for tomorrow with minimal ultrafiltration given limited intake in the setting of ileus. 3. Anemia: Hemoglobin and hematocrit improved status post PRBC transfusion, no overt blood loss noted.  Continue ESA. 4. CKD-MBD: Corrected calcium  and phosphorus levels both within acceptable range, monitor trend.  Currently n.p.o. for ileus. 5.  Abdominal pain/distention: Minimal yield with attempted paracentesis yesterday and clinically appears to be better status post NGT with intermittent suction. 6. Hypertension: Elevated blood pressures noted, will follow closely on current antihypertensive medications with minimal UF.  Subjective:   Reports to be having good bowel movements at this time and would like her laxatives stopped.  Complains of pain in her legs from chronic neuropathy and some abdominal wall discomfort.   Objective:   BP (!) 168/78 (BP Location: Left Arm)   Pulse 76   Temp 99.2 F (37.3 C) (Oral)   Resp 18   Ht 5' 7 (1.702 m)   Wt 55.7 kg   SpO2 92%   BMI 19.23 kg/m   Physical Exam: Gen: Appears comfortable resting in bed, watching television CVS: Pulse regular rhythm, normal rate, S1 and S2 normal Resp: Diminished breath sounds over bases, no rales/rhonchi Abd: Soft, moderately distended, mild tenderness over lower quadrants Ext: No lower extremity edema.  Right upper arm AV fistula with intact  dressings  Labs: BMET Recent Labs  Lab 01/02/24 0313 01/03/24 0840 01/04/24 0403 01/05/24 0854 01/06/24 0322 01/07/24 0327  NA 130* 129* 128* 126* 129* 125*  K 4.0 4.2 4.0 4.2 4.3 4.8  CL 94* 94* 93* 91* 93* 91*  CO2 21* 24 25 20* 25 23  GLUCOSE 101* 83 101* 109* 92 89  BUN 35* 17 30* 38* 22 33*  CREATININE 4.49* 2.81* 3.90* 4.48* 2.98* 3.90*  CALCIUM  8.0* 8.0* 8.2* 8.3* 8.3* 8.6*  PHOS 3.6 3.1  --   --  3.2 4.5   CBC Recent Labs  Lab 01/04/24 0403 01/05/24 0854 01/06/24 0322 01/06/24 0640 01/07/24 0327  WBC 5.6 7.1 6.6  --  6.3  HGB 7.4* 7.3* 6.9* 7.0* 8.1*  HCT 22.6* 22.2* 21.7* 21.9* 24.8*  MCV 94.6 95.3 96.0  --  93.9  PLT 240 240 237  --  230     Medications:     amLODipine   10 mg Oral Daily   atorvastatin   10 mg Oral Daily   bisacodyl   10 mg Oral BID   calcitRIOL   0.25 mcg Oral Daily   carvedilol   6.25 mg Oral BID WC   Chlorhexidine  Gluconate Cloth  6 each Topical Q0600   Chlorhexidine  Gluconate Cloth  6 each Topical Q0600   Chlorhexidine  Gluconate Cloth  6 each Topical Q0600   Chlorhexidine  Gluconate Cloth  6 each Topical Q0600   darbepoetin (ARANESP ) injection - DIALYSIS  100 mcg Subcutaneous Once   docusate sodium   100 mg Oral BID   ferrous sulfate   325 mg Oral Q breakfast   gabapentin   100 mg Oral TID   guaiFENesin   600 mg  Oral BID   heparin   5,000 Units Subcutaneous Q8H   lactulose   30 g Oral BID   pantoprazole   40 mg Oral BID   polyethylene glycol  17 g Oral BID   sodium bicarbonate   1,300 mg Oral BID   sodium chloride  flush  3 mL Intravenous Q12H   sucralfate   1 g Oral TID WC & HS   traZODone   50 mg Oral QHS   Gordy Blanch, MD 01/08/2024, 9:02 AM

## 2024-01-08 NOTE — Progress Notes (Signed)
 Patient has home medications at bedside. This nurse asked if medications could be locked up in pharmacy or picked up by family member until discharge. Patient verbalized that she did not have anyone who could pick up her medication and refused to have medication locked up in pharmacy. Patient verbalized that she would not take any of her home medications while here. Charge nurse Nathanel made aware.

## 2024-01-08 NOTE — Plan of Care (Signed)
   Problem: Clinical Measurements: Goal: Respiratory complications will improve Outcome: Progressing

## 2024-01-08 NOTE — Progress Notes (Signed)
 PROGRESS NOTE    [name]  FMW:[id number] DOB: 13-Feb-1961 DOA: 12/30/2023 PCP: Pcp, No    Brief Narrative:   63 y.o. female with PMH significant for ESRD on hemodialysis, Noncompliant with dialysis, homeless, COPD, Chronic diastolic CHF, Chronic pain syndrome, history of polysubstance use including cocaine, depression, GERD, alcohol  use, chronic tobacco use presented in the ED with abdominal distention and shortness of breath.    Assessment & Plan:      Acute epigastric pain with distention/Ileus Severe constipation - CT scan showing ileus/constipation.  NG tube placed on 10/22 which patient has already removed.  Her abdomen is quite distended but slightly soft compared to yesterday.  She had 2 large bowel movements overnight, will plan to start her on clear liquid diet.  Patient continues to argue that she wants regular diet which have advised against. -Continue aggressive bowel regimen and enema.   Ascites - No known history of ascites in the past but noted abdominal distention on presentation.  S/p paracentesis 10/17 with 2.8 L removed.  Repeat attempt 10/22, no evidence of ascites  Acute hypoxic respiratory failure - Secondary to volume overload.  Initially placed on 3 L nasal cannula.  Able to be weaned down to room air.  Resolved.   Acute exacerbation HFpEF/ESRD/volume overload - Noncompliant with HD the outpatient setting.  Homeless, comes to ED for HD periodically.  Nephrology on board.   COPD - Inhalers/nebulizers on board.  Wean O2 as above.  Does not appear to be in acute exacerbation.   Anemia of chronic kidney disease - Hemoglobin 6.9/7.0 this morning.  Will order 1 more unit PRBCs.  S/p 1 unit PRBCs 10/17.  Hemoglobin showing slow downtrend.  No active bleeding appreciated.  Will recheck CBC in AM.   Severe peripheral arterial disease/peripheral neuropathy - Pain not well-controlled.  Continues on gabapentin  3 times daily 100 mg.  oxycodone  IR 5 mg from  every 4 hours as needed.   Possible urinary tract infection (POA) - UA noting LE, WBCs, bacteria.  Preliminary urine cultures noting possible group B strep.  Currently on ceftriaxone .   Polysubstance abuse - Noted positive tox screen for cocaine on presentation.  Encourage cessation.   Left foot foreign body (POA) - Noted on imaging 11/2023.  Apparently a radiopaque BB as well as concern for a 3 mm needle tip between her 1st and 2nd toe.  Chronicity unclear.  Length of treatment: To patient's ongoing BL LEE pain.  No urgent need for surgical intervention at this time.  Does not appear to be acutely infected.   Goals of care - Patient is homeless.  TOC following   DVT prophylaxis: heparin  injection 5,000 Units Start: 12/30/23 1715      Code Status: Limited: Do not attempt resuscitation (DNR) -DNR-LIMITED -Do Not Intubate/DNI  Family Communication:   Status is: Inpatient Remains inpatient appropriate because: Hopefully discharge once she is able to tolerate orals   PT Follow up Recs:   Subjective: Unfortunately patient pulled out her NG tube overnight.  She is also had couple large bowel movements.  Even though her abdomen is distended she continues to argue and insist that she wants a regular diet.  I explained to her that this is not medically safe for her and we will have to slowly advance her diet.  Examination:  General exam: Appears calm and comfortable  Respiratory system: Clear to auscultation. Respiratory effort normal. Cardiovascular system: S1 & S2 heard, RRR. No JVD, murmurs, rubs, gallops or clicks.  No pedal edema. Gastrointestinal system: Abdomen is distended Central nervous system: Alert and oriented. No focal neurological deficits. Extremities: Symmetric 5 x 5 power. Skin: No rashes, lesions or ulcers Psychiatry: Judgement and insight appear normal. Mood & affect appropriate.                Diet Orders (From admission, onward)     Start     Ordered    01/08/24 0932  Diet clear liquid Room service appropriate? Yes; Fluid consistency: Thin  Diet effective now       Question Answer Comment  Room service appropriate? Yes   Fluid consistency: Thin      01/08/24 0931            Objective: Vitals:   01/07/24 2015 01/07/24 2017 01/08/24 0750 01/08/24 1139  BP:  (!) 153/105 (!) 168/78 (!) 141/82  Pulse: 84 81 76 80  Resp:  18 18 18   Temp:  99.4 F (37.4 C) 99.2 F (37.3 C) 97.6 F (36.4 C)  TempSrc:  Oral Oral Oral  SpO2: 96% 96% 92% 93%  Weight:      Height:       No intake or output data in the 24 hours ending 01/08/24 1158 Filed Weights   01/05/24 1446 01/05/24 1828 01/07/24 0908  Weight: 53.2 kg 50.8 kg 55.7 kg    Scheduled Meds:  amLODipine   10 mg Oral Daily   atorvastatin   10 mg Oral Daily   bisacodyl   10 mg Oral BID   calcitRIOL   0.25 mcg Oral Daily   carvedilol   6.25 mg Oral BID WC   Chlorhexidine  Gluconate Cloth  6 each Topical Q0600   Chlorhexidine  Gluconate Cloth  6 each Topical Q0600   Chlorhexidine  Gluconate Cloth  6 each Topical Q0600   Chlorhexidine  Gluconate Cloth  6 each Topical Q0600   darbepoetin (ARANESP ) injection - DIALYSIS  100 mcg Subcutaneous Once   docusate sodium   100 mg Oral BID   ferrous sulfate   325 mg Oral Q breakfast   gabapentin   100 mg Oral TID   guaiFENesin   600 mg Oral BID   heparin   5,000 Units Subcutaneous Q8H   lactulose   30 g Oral BID   pantoprazole   40 mg Oral BID   polyethylene glycol  17 g Oral BID   sodium bicarbonate   1,300 mg Oral BID   sodium chloride  flush  3 mL Intravenous Q12H   sucralfate   1 g Oral TID WC & HS   traZODone   50 mg Oral QHS   Continuous Infusions:  Nutritional status     Body mass index is 19.23 kg/m.  Data Reviewed:   CBC: Recent Labs  Lab 01/04/24 0403 01/05/24 0854 01/06/24 0322 01/06/24 0640 01/07/24 0327 01/08/24 0925  WBC 5.6 7.1 6.6  --  6.3 6.0  HGB 7.4* 7.3* 6.9* 7.0* 8.1* 8.1*  HCT 22.6* 22.2* 21.7* 21.9* 24.8* 25.0*   MCV 94.6 95.3 96.0  --  93.9 94.3  PLT 240 240 237  --  230 248   Basic Metabolic Panel: Recent Labs  Lab 01/02/24 0313 01/03/24 0840 01/04/24 0403 01/05/24 0854 01/06/24 0322 01/07/24 0327 01/08/24 0925  NA 130* 129* 128* 126* 129* 125* 130*  K 4.0 4.2 4.0 4.2 4.3 4.8 4.7  CL 94* 94* 93* 91* 93* 91* 94*  CO2 21* 24 25 20* 25 23 25   GLUCOSE 101* 83 101* 109* 92 89 86  BUN 35* 17 30* 38* 22 33* 23  CREATININE 4.49*  2.81* 3.90* 4.48* 2.98* 3.90* 3.59*  CALCIUM  8.0* 8.0* 8.2* 8.3* 8.3* 8.6* 8.3*  MG 1.8 1.7 1.8  --  1.8  --   --   PHOS 3.6 3.1  --   --  3.2 4.5  --    GFR: Estimated Creatinine Clearance: 14.1 mL/min (A) (by C-G formula based on SCr of 3.59 mg/dL (H)). Liver Function Tests: Recent Labs  Lab 01/03/24 0840 01/04/24 0403 01/05/24 0854 01/06/24 0322 01/07/24 0327  AST 32 33 25  --   --   ALT 15 16 15   --   --   ALKPHOS 73 70 82  --   --   BILITOT 0.5 0.8 1.0  --   --   PROT 5.6* 5.4* 5.8*  --   --   ALBUMIN  2.2* 2.1* 2.3* 2.2* 2.3*   No results for input(s): LIPASE, AMYLASE in the last 168 hours. No results for input(s): AMMONIA in the last 168 hours. Coagulation Profile: No results for input(s): INR, PROTIME in the last 168 hours. Cardiac Enzymes: No results for input(s): CKTOTAL, CKMB, CKMBINDEX, TROPONINI in the last 168 hours. BNP (last 3 results) No results for input(s): PROBNP in the last 8760 hours. HbA1C: No results for input(s): HGBA1C in the last 72 hours. CBG: No results for input(s): GLUCAP in the last 168 hours. Lipid Profile: No results for input(s): CHOL, HDL, LDLCALC, TRIG, CHOLHDL, LDLDIRECT in the last 72 hours. Thyroid Function Tests: No results for input(s): TSH, T4TOTAL, FREET4, T3FREE, THYROIDAB in the last 72 hours. Anemia Panel: No results for input(s): VITAMINB12, FOLATE, FERRITIN, TIBC, IRON , RETICCTPCT in the last 72 hours. Sepsis Labs: No results for input(s):  PROCALCITON, LATICACIDVEN in the last 168 hours.  Recent Results (from the past 240 hours)  Blood Culture (routine x 2)     Status: None   Collection Time: 12/30/23 12:25 PM   Specimen: BLOOD  Result Value Ref Range Status   Specimen Description BLOOD LEFT ANTECUBITAL  Final   Special Requests   Final    BOTTLES DRAWN AEROBIC AND ANAEROBIC Blood Culture adequate volume   Culture   Final    NO GROWTH 5 DAYS Performed at [facility], [address], [place], [place] [address]    Report Status 01/04/2024 FINAL  Final  Resp panel by RT-PCR (RSV, Flu A&B, Covid) Anterior Nasal Swab     Status: None   Collection Time: 12/30/23 12:25 PM   Specimen: Anterior Nasal Swab  Result Value Ref Range Status   SARS Coronavirus 2 by RT PCR NEGATIVE NEGATIVE Final   Influenza A by PCR NEGATIVE NEGATIVE Final   Influenza B by PCR NEGATIVE NEGATIVE Final    Comment: (NOTE) The Xpert Xpress SARS-CoV-2/FLU/RSV plus assay is intended as an aid in the diagnosis of influenza from Nasopharyngeal swab specimens and should not be used as a sole basis for treatment. Nasal washings and aspirates are unacceptable for Xpert Xpress SARS-CoV-2/FLU/RSV testing.  Fact Sheet for Patients: BloggerCourse.com  Fact Sheet for Healthcare Providers: SeriousBroker.it  This test is not yet approved or cleared by the [place]  FDA and has been authorized for detection and/or diagnosis of SARS-CoV-2 by FDA under an Emergency Use Authorization (EUA). This EUA will remain in effect (meaning this test can be used) for the duration of the COVID-19 declaration under Section 564(b)(1) of the Act, 21 U.S.C. section 360bbb-3(b)(1), unless the authorization is terminated or revoked.     Resp Syncytial Virus by PCR NEGATIVE NEGATIVE  Final    Comment: (NOTE) Fact Sheet for Patients: BloggerCourse.com  Fact Sheet for Healthcare  Providers: SeriousBroker.it  This test is not yet approved or cleared by the [place]  FDA and has been authorized for detection and/or diagnosis of SARS-CoV-2 by FDA under an Emergency Use Authorization (EUA). This EUA will remain in effect (meaning this test can be used) for the duration of the COVID-19 declaration under Section 564(b)(1) of the Act, 21 U.S.C. section 360bbb-3(b)(1), unless the authorization is terminated or revoked.  Performed at [facility], [address] [place], [place] [address]   Urine Culture     Status: Abnormal   Collection Time: 12/30/23  1:05 PM   Specimen: Urine, Random  Result Value Ref Range Status   Specimen Description URINE, RANDOM  Final   Special Requests NONE Reflexed from [id number]  Final   Culture (A)  Final    >=100,000 COLONIES/mL GROUP B STREP(S.AGALACTIAE)ISOLATED TESTING AGAINST S. AGALACTIAE NOT ROUTINELY PERFORMED DUE TO PREDICTABILITY OF AMP/PEN/VAN SUSCEPTIBILITY. Performed at [facility], [address], [place], [place] [address]    Report Status 12/31/2023 FINAL  Final  Blood Culture (routine x 2)     Status: None   Collection Time: 12/30/23  5:00 PM   Specimen: BLOOD LEFT HAND  Result Value Ref Range Status   Specimen Description BLOOD LEFT HAND  Final   Special Requests   Final    BOTTLES DRAWN AEROBIC AND ANAEROBIC Blood Culture adequate volume   Culture   Final    NO GROWTH 5 DAYS Performed at [facility], [address] [place], [place] [address]    Report Status 01/04/2024 FINAL  Final  Culture, body fluid w Gram Stain-bottle     Status: None   Collection Time: 01/02/24 11:52 AM   Specimen: Peritoneal Washings  Result Value Ref Range Status   Specimen Description PERITONEAL  Final   Special Requests NONE  Final   Culture   Final    NO GROWTH 5 DAYS Performed at [facility], [address] [place], [place] [address]    Report Status 01/07/2024 FINAL   Final  Gram stain     Status: None   Collection Time: 01/02/24 11:52 AM   Specimen: Peritoneal Washings  Result Value Ref Range Status   Specimen Description PERITONEAL  Final   Special Requests NONE  Final   Gram Stain   Final    WBC PRESENT, PREDOMINANTLY MONONUCLEAR NO ORGANISMS SEEN CYTOSPIN SMEAR Performed at [facility], [address] [place], [place] [address]    Report Status 01/02/2024 FINAL  Final         Radiology Studies: DG Abd 1 View Result Date: 01/07/2024 EXAM: 1 VIEW XRAY OF THE ABDOMEN 01/07/2024 09:15:00 PM COMPARISON: Comparison with same day x-ray and CT 01/06/2024. CLINICAL HISTORY: Constipation. FINDINGS: BOWEL: Persistent dilation of the small bowel. Large colonic stool burden. SOFT TISSUES: No opaque urinary calculi. BONES: No acute osseous abnormality. IMPRESSION: 1. Persistent dilation of the small bowel. 2. Large colonic stool burden. Electronically signed by: Norman Gatlin MD 01/07/2024 09:23 PM EDT RP Workstation: [id number]   DG Abd 1 View Result Date: 01/07/2024 CLINICAL DATA:  Abdominal distension, enteric catheter placement EXAM: ABDOMEN - 1 VIEW COMPARISON:  01/06/2024 FINDINGS: Frontal view of the lower chest and upper abdomen demonstrates enteric catheter passing below diaphragm, tip and side port projecting over the gastric body. Continued distension of the large and small  bowel, with large amount of retained stool throughout the colon again noted. Stable left pleural effusion. IMPRESSION: 1. Enteric catheter tip and side port projecting over the gastric body. 2. Stable distension of the large and small bowel compatible with ileus or distal obstruction. Electronically Signed   By: Ozell Daring M.D.   On: 01/07/2024 15:23   IR ABDOMEN US  LIMITED Result Date: 01/07/2024 INDICATION: History of ESRD on hemodialysis. Moderate but decreasing amount of ascites on CT yesterday. Consult for paracentesis. EXAM: ULTRASOUND ABDOMEN LIMITED  FINDINGS: Imaging of all 4 quadrants of the abdomen reveal no ascites IMPRESSION: No ascites seen in ultrasound.  No need for paracentesis. Performed by: Wyatt Pommier, PA-C Electronically Signed   By: CHRISTELLA.  Shick M.D.   On: 01/07/2024 14:25   CT ABDOMEN PELVIS WO CONTRAST Result Date: 01/06/2024 EXAM: CT ABDOMEN AND PELVIS WITHOUT CONTRAST 01/06/2024 04:24:55 PM TECHNIQUE: CT of the abdomen and pelvis was performed without the administration of intravenous contrast. Multiplanar reformatted images are provided for review. Automated exposure control, iterative reconstruction, and/or weight-based adjustment of the mA/kV was utilized to reduce the radiation dose to as low as reasonably achievable. COMPARISON: Comparison with 12/30/2023. CLINICAL HISTORY: Abdominal pain, acute, nonlocalized. FINDINGS: LOWER CHEST: Small left greater than right pleural effusions. Associated basilar atelectasis or pneumonia. LIVER: Nodular Hepatic contour compatible with cirrhosis. GALLBLADDER AND BILE DUCTS: Gallbladder is unremarkable. No biliary ductal dilatation. SPLEEN: No acute abnormality. PANCREAS: No acute abnormality. ADRENAL GLANDS: No acute abnormality. 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 No stones in the kidneys or ureters. No hydronephrosis. No perinephric or periureteral stranding. Urinary bladder is unremarkable. GI AND BOWEL: Distended stomach. Diffuse dilation of the small bowel. No abrupt transition. Gradual dilution of enteric contrast in the small bowel. Fecalization of the distal ileum. Findings are compatible with slow transit / ileus. Moderate to large colonic stool burden. Normal appendix. PERITONEUM AND RETROPERITONEUM: Moderate abdominal pelvic ascites decreased from 12/30/2023. No free air. VASCULATURE: Aorta is normal in caliber. Aortic atherosclerotic calcification. LYMPH NODES: No lymphadenopathy. REPRODUCTIVE ORGANS: No acute abnormality. BONES AND SOFT TISSUES: Avascular  necrosis in the right femoral head. No acute osseous abnormality. Body wall edema. IMPRESSION: 1. Findings compatible with slow transit/ileus  and constipation. 2. Moderate abdominopelvic ascites, decreased from 12/30/23. 3. Small left greater than right pleural effusions with associated basilar atelectasis or pneumonia. 4. Cirrhosis. Electronically signed by: Norman Gatlin MD 01/06/2024 08:26 PM EDT RP Workstation: [id number]           LOS: 9 days   Time spent= 35 mins    Burgess JAYSON Dare, MD [facility]  If 7PM-7AM, please contact night-coverage  01/08/2024, 11:58 AM

## 2024-01-09 LAB — CBC
HCT: 26.4 % — ABNORMAL LOW (ref 36.0–46.0)
Hemoglobin: 8.4 g/dL — ABNORMAL LOW (ref 12.0–15.0)
MCH: 30.3 pg (ref 26.0–34.0)
MCHC: 31.8 g/dL (ref 30.0–36.0)
MCV: 95.3 fL (ref 80.0–100.0)
Platelets: 263 K/uL (ref 150–400)
RBC: 2.77 MIL/uL — ABNORMAL LOW (ref 3.87–5.11)
RDW: 15.3 % (ref 11.5–15.5)
WBC: 6.1 K/uL (ref 4.0–10.5)
nRBC: 0 % (ref 0.0–0.2)

## 2024-01-09 LAB — MAGNESIUM: Magnesium: 2.1 mg/dL (ref 1.7–2.4)

## 2024-01-09 LAB — PHOSPHORUS: Phosphorus: 3.6 mg/dL (ref 2.5–4.6)

## 2024-01-09 MED ORDER — OXYCODONE HCL 5 MG PO TABS
ORAL_TABLET | ORAL | Status: AC
  Filled 2024-01-09: qty 1

## 2024-01-09 MED ORDER — HEPARIN SODIUM (PORCINE) 1000 UNIT/ML IJ SOLN
INTRAMUSCULAR | Status: AC
  Filled 2024-01-09: qty 3

## 2024-01-09 MED ORDER — HEPARIN SODIUM (PORCINE) 1000 UNIT/ML DIALYSIS
40.0000 [IU]/kg | INTRAMUSCULAR | Status: DC | PRN
  Administered 2024-01-09: 2200 [IU] via INTRAVENOUS_CENTRAL

## 2024-01-09 MED ORDER — HEPARIN SODIUM (PORCINE) 1000 UNIT/ML DIALYSIS: 20.0000 [IU]/kg | INTRAMUSCULAR | Status: DC | PRN

## 2024-01-09 NOTE — Progress Notes (Signed)
   01/09/24 1751  Vitals  Temp 98.4 F (36.9 C)  Pulse Rate 79  Resp 19  BP (!) 141/72  SpO2 92 %  O2 Device Room Air  Weight 52.1 kg  Type of Weight Post-Dialysis  Oxygen  Therapy  Patient Activity (if Appropriate) In bed  Pulse Oximetry Type Continuous  Oximetry Probe Site Changed No  Post Treatment  Dialyzer Clearance Lightly streaked  Hemodialysis Intake (mL) 0 mL  Liters Processed 71.7  Fluid Removed (mL) 500 mL  Tolerated HD Treatment Yes  AVG/AVF Arterial Site Held (minutes) 5 minutes  AVG/AVF Venous Site Held (minutes) 5 minutes   Received patient in bed to unit.  Alert and oriented.  Informed consent signed and in chart.   TX duration:3.15  Patient tolerated well.  Transported back to the room  Alert, without acute distress.  Hand-off given to patient's nurse.   Access used: RUAF Access issues: 3no complications  Total UF removed: 500cc per md order Medication(s) given: oxy 5mg  po x1   [name]

## 2024-01-09 NOTE — Progress Notes (Signed)
 Patient ID: [name], female   DOB: Sep 07, 1960, 63 y.o.   MRN: [id number] [facility] Progress Note   Assessment/ Plan:   1.  Acute hypoxic respiratory failure: Appears to be in part from volume overload and decreased respiratory excursion secondary to severe ascites.  Improved status post hemodialysis/ultrafiltration and previous paracentesis. 2. ESRD: Typically on MWF schedule while here in the hospital; she does not have an outpatient dialysis unit because of nonadherence with keeping scheduled appointments.  Hemodialysis today. 3. Anemia: Hemoglobin and hematocrit improved status post PRBC transfusion, no overt blood loss noted.  Continue ESA. 4. CKD-MBD: Corrected calcium  and phosphorus levels both within acceptable range, monitor trend.  Currently n.p.o. for ileus. 5.  Abdominal pain/distention: Minimal yield with attempted paracentesis yesterday and clinically appears to be better status post NGT with intermittent suction. 6. Hypertension: Elevated blood pressures noted, will follow closely on current antihypertensive medications with minimal UF.  Subjective:   Complains of discomfort from diarrhea with recent bowel regimen and wants to advance to regular diet.   Objective:   BP (!) 167/79 (BP Location: Left Arm)   Pulse 80   Temp 98.4 F (36.9 C) (Oral)   Resp 16   Ht 5' 7 (1.702 m)   Wt 53 kg   SpO2 97%   BMI 18.29 kg/m   Physical Exam: Gen: Comfortably resting in bed, awakens to voice CVS: Pulse regular rhythm, normal rate, S1 and S2 normal Resp: Diminished breath sounds over bases, no rales/rhonchi Abd: Soft, moderately distended, mild tenderness over lower quadrants Ext: No lower extremity edema.  Right upper arm AV fistula with intact dressings  Labs: BMET Recent Labs  Lab 01/03/24 0840 01/04/24 0403 01/05/24 9145 01/06/24 0322 01/07/24 0327 01/08/24 0925 01/09/24 0824  NA 129* 128* 126* 129* 125* 130*  --   K 4.2 4.0 4.2 4.3 4.8 4.7  --    CL 94* 93* 91* 93* 91* 94*  --   CO2 24 25 20* 25 23 25   --   GLUCOSE 83 101* 109* 92 89 86  --   BUN 17 30* 38* 22 33* 23  --   CREATININE 2.81* 3.90* 4.48* 2.98* 3.90* 3.59*  --   CALCIUM  8.0* 8.2* 8.3* 8.3* 8.6* 8.3*  --   PHOS 3.1  --   --  3.2 4.5  --  3.6   CBC Recent Labs  Lab 01/06/24 0322 01/06/24 0640 01/07/24 0327 01/08/24 0925 01/09/24 0824  WBC 6.6  --  6.3 6.0 6.1  HGB 6.9* 7.0* 8.1* 8.1* 8.4*  HCT 21.7* 21.9* 24.8* 25.0* 26.4*  MCV 96.0  --  93.9 94.3 95.3  PLT 237  --  230 248 263     Medications:     amLODipine   10 mg Oral Daily   atorvastatin   10 mg Oral Daily   bisacodyl   10 mg Oral BID   calcitRIOL   0.25 mcg Oral Daily   carvedilol   6.25 mg Oral BID WC   Chlorhexidine  Gluconate Cloth  6 each Topical Q0600   Chlorhexidine  Gluconate Cloth  6 each Topical Q0600   Chlorhexidine  Gluconate Cloth  6 each Topical Q0600   Chlorhexidine  Gluconate Cloth  6 each Topical Q0600   darbepoetin (ARANESP ) injection - DIALYSIS  100 mcg Subcutaneous Once   docusate sodium   100 mg Oral BID   ferrous sulfate   325 mg Oral Q breakfast   gabapentin   100 mg Oral TID   guaiFENesin   600 mg Oral BID  heparin   5,000 Units Subcutaneous Q8H   lactulose   30 g Oral BID   pantoprazole   40 mg Oral BID   polyethylene glycol  17 g Oral BID   sodium bicarbonate   1,300 mg Oral BID   sodium chloride  flush  3 mL Intravenous Q12H   sucralfate   1 g Oral TID WC & HS   traZODone   50 mg Oral QHS   Gordy Blanch, MD 01/09/2024, 9:51 AM

## 2024-01-09 NOTE — Progress Notes (Signed)
 PROGRESS NOTE    [name]  FMW:[id number] DOB: April 26, 1960 DOA: 12/30/2023 PCP: Pcp, No    Brief Narrative:   63 y.o. female with PMH significant for ESRD on hemodialysis, Noncompliant with dialysis, homeless, COPD, Chronic diastolic CHF, Chronic pain syndrome, history of polysubstance use including cocaine, depression, GERD, alcohol  use, chronic tobacco use presented in the ED with abdominal distention and shortness of breath.    Assessment & Plan:      Acute epigastric pain with distention/Ileus Severe constipation - CT scan showing ileus/constipation.  NG tube placed on 10/22 which patient has already removed.  Having bowel movements and slowly improving.  Continue aggressive bowel regimen.  Will advance diet as tolerated.   Ascites - No known history of ascites in the past but noted abdominal distention on presentation.  S/p paracentesis 10/17 with 2.8 L removed.  Repeat attempt 10/22, no evidence of ascites  Acute hypoxic respiratory failure - Secondary to volume overload.  Initially placed on 3 L nasal cannula.  Able to be weaned down to room air.  Resolved.   Acute exacerbation HFpEF/ESRD/volume overload - Noncompliant with HD the outpatient setting.  Homeless, comes to ED for HD periodically.  Nephrology on board.   COPD - Inhalers/nebulizers on board.  Wean O2 as above.  Does not appear to be in acute exacerbation.   Anemia of chronic kidney disease - Hemoglobin 6.9/7.0 this morning.  Will order 1 more unit PRBCs.  S/p 1 unit PRBCs 10/17.  Hemoglobin showing slow downtrend.  No active bleeding appreciated.  Will recheck CBC in AM.   Severe peripheral arterial disease/peripheral neuropathy - Pain not well-controlled.  Continues on gabapentin  3 times daily 100 mg.  oxycodone  IR 5 mg from every 4 hours as needed.   Possible urinary tract infection (POA) - UA noting LE, WBCs, bacteria.  Preliminary urine cultures noting possible group B strep.  Currently on  ceftriaxone .   Polysubstance abuse - Noted positive tox screen for cocaine on presentation.  Encourage cessation.   Left foot foreign body (POA) - Noted on imaging 11/2023.  Apparently a radiopaque BB as well as concern for a 3 mm needle tip between her 1st and 2nd toe.  Chronicity unclear.  Length of treatment: To patient's ongoing BL LEE pain.  No urgent need for surgical intervention at this time.  Does not appear to be acutely infected.   Goals of care - Patient is homeless.  TOC following   DVT prophylaxis: heparin  injection 5,000 Units Start: 12/30/23 1715      Code Status: Limited: Do not attempt resuscitation (DNR) -DNR-LIMITED -Do Not Intubate/DNI  Family Communication:   Status is: Inpatient Remains inpatient appropriate because: Hopefully discharge once she is able to tolerate orals   PT Follow up Recs:   Subjective: Tells me her abdominal pain is better and had multiple bowel movements yesterday.  Her abdomen is feeling slightly soft She really wants her diet to be advanced  Examination:  General exam: Appears calm and comfortable  Respiratory system: Clear to auscultation. Respiratory effort normal. Cardiovascular system: S1 & S2 heard, RRR. No JVD, murmurs, rubs, gallops or clicks. No pedal edema. Gastrointestinal system: Abdomen is distended but improved Central nervous system: Alert and oriented. No focal neurological deficits. Extremities: Symmetric 5 x 5 power. Skin: No rashes, lesions or ulcers Psychiatry: Judgement and insight appear normal. Mood & affect appropriate.                Diet Orders (From admission,  onward)     Start     Ordered   01/09/24 0938  DIET SOFT Room service appropriate? Yes; Fluid consistency: Thin  Diet effective now       Question Answer Comment  Room service appropriate? Yes   Fluid consistency: Thin      01/09/24 0937            Objective: Vitals:   01/08/24 2340 01/09/24 0438 01/09/24 0604 01/09/24 0758   BP: (!) 168/94 (!) 146/64  (!) 167/79  Pulse: 91 81  80  Resp: 20 17  16   Temp: 98.7 F (37.1 C) 99.6 F (37.6 C)  98.4 F (36.9 C)  TempSrc: Oral Oral  Oral  SpO2: 96% 96%  97%  Weight:   53 kg   Height:       No intake or output data in the 24 hours ending 01/09/24 1028 Filed Weights   01/05/24 1828 01/07/24 0908 01/09/24 0604  Weight: 50.8 kg 55.7 kg 53 kg    Scheduled Meds:  amLODipine   10 mg Oral Daily   atorvastatin   10 mg Oral Daily   bisacodyl   10 mg Oral BID   calcitRIOL   0.25 mcg Oral Daily   carvedilol   6.25 mg Oral BID WC   Chlorhexidine  Gluconate Cloth  6 each Topical Q0600   Chlorhexidine  Gluconate Cloth  6 each Topical Q0600   Chlorhexidine  Gluconate Cloth  6 each Topical Q0600   Chlorhexidine  Gluconate Cloth  6 each Topical Q0600   darbepoetin (ARANESP ) injection - DIALYSIS  100 mcg Subcutaneous Once   docusate sodium   100 mg Oral BID   ferrous sulfate   325 mg Oral Q breakfast   gabapentin   100 mg Oral TID   guaiFENesin   600 mg Oral BID   heparin   5,000 Units Subcutaneous Q8H   lactulose   30 g Oral BID   pantoprazole   40 mg Oral BID   polyethylene glycol  17 g Oral BID   sodium bicarbonate   1,300 mg Oral BID   sodium chloride  flush  3 mL Intravenous Q12H   sucralfate   1 g Oral TID WC & HS   traZODone   50 mg Oral QHS   Continuous Infusions:  Nutritional status     Body mass index is 18.29 kg/m.  Data Reviewed:   CBC: Recent Labs  Lab 01/05/24 0854 01/06/24 0322 01/06/24 0640 01/07/24 0327 01/08/24 0925 01/09/24 0824  WBC 7.1 6.6  --  6.3 6.0 6.1  HGB 7.3* 6.9* 7.0* 8.1* 8.1* 8.4*  HCT 22.2* 21.7* 21.9* 24.8* 25.0* 26.4*  MCV 95.3 96.0  --  93.9 94.3 95.3  PLT 240 237  --  230 248 263   Basic Metabolic Panel: Recent Labs  Lab 01/03/24 0840 01/04/24 0403 01/05/24 0854 01/06/24 0322 01/07/24 0327 01/08/24 0925 01/09/24 0824  NA 129* 128* 126* 129* 125* 130*  --   K 4.2 4.0 4.2 4.3 4.8 4.7  --   CL 94* 93* 91* 93* 91* 94*  --    CO2 24 25 20* 25 23 25   --   GLUCOSE 83 101* 109* 92 89 86  --   BUN 17 30* 38* 22 33* 23  --   CREATININE 2.81* 3.90* 4.48* 2.98* 3.90* 3.59*  --   CALCIUM  8.0* 8.2* 8.3* 8.3* 8.6* 8.3*  --   MG 1.7 1.8  --  1.8  --   --  2.1  PHOS 3.1  --   --  3.2 4.5  --  3.6   GFR: Estimated Creatinine Clearance: 13.4 mL/min (A) (by C-G formula based on SCr of 3.59 mg/dL (H)). Liver Function Tests: Recent Labs  Lab 01/03/24 0840 01/04/24 0403 01/05/24 0854 01/06/24 0322 01/07/24 0327  AST 32 33 25  --   --   ALT 15 16 15   --   --   ALKPHOS 73 70 82  --   --   BILITOT 0.5 0.8 1.0  --   --   PROT 5.6* 5.4* 5.8*  --   --   ALBUMIN  2.2* 2.1* 2.3* 2.2* 2.3*   No results for input(s): LIPASE, AMYLASE in the last 168 hours. No results for input(s): AMMONIA in the last 168 hours. Coagulation Profile: No results for input(s): INR, PROTIME in the last 168 hours. Cardiac Enzymes: No results for input(s): CKTOTAL, CKMB, CKMBINDEX, TROPONINI in the last 168 hours. BNP (last 3 results) No results for input(s): PROBNP in the last 8760 hours. HbA1C: No results for input(s): HGBA1C in the last 72 hours. CBG: No results for input(s): GLUCAP in the last 168 hours. Lipid Profile: No results for input(s): CHOL, HDL, LDLCALC, TRIG, CHOLHDL, LDLDIRECT in the last 72 hours. Thyroid Function Tests: No results for input(s): TSH, T4TOTAL, FREET4, T3FREE, THYROIDAB in the last 72 hours. Anemia Panel: No results for input(s): VITAMINB12, FOLATE, FERRITIN, TIBC, IRON , RETICCTPCT in the last 72 hours. Sepsis Labs: No results for input(s): PROCALCITON, LATICACIDVEN in the last 168 hours.  Recent Results (from the past 240 hours)  Blood Culture (routine x 2)     Status: None   Collection Time: 12/30/23 12:25 PM   Specimen: BLOOD  Result Value Ref Range Status   Specimen Description BLOOD LEFT ANTECUBITAL  Final   Special Requests   Final     BOTTLES DRAWN AEROBIC AND ANAEROBIC Blood Culture adequate volume   Culture   Final    NO GROWTH 5 DAYS Performed at [facility], [address], [place], [place] [address]    Report Status 01/04/2024 FINAL  Final  Resp panel by RT-PCR (RSV, Flu A&B, Covid) Anterior Nasal Swab     Status: None   Collection Time: 12/30/23 12:25 PM   Specimen: Anterior Nasal Swab  Result Value Ref Range Status   SARS Coronavirus 2 by RT PCR NEGATIVE NEGATIVE Final   Influenza A by PCR NEGATIVE NEGATIVE Final   Influenza B by PCR NEGATIVE NEGATIVE Final    Comment: (NOTE) The Xpert Xpress SARS-CoV-2/FLU/RSV plus assay is intended as an aid in the diagnosis of influenza from Nasopharyngeal swab specimens and should not be used as a sole basis for treatment. Nasal washings and aspirates are unacceptable for Xpert Xpress SARS-CoV-2/FLU/RSV testing.  Fact Sheet for Patients: BloggerCourse.com  Fact Sheet for Healthcare Providers: SeriousBroker.it  This test is not yet approved or cleared by the [place]  FDA and has been authorized for detection and/or diagnosis of SARS-CoV-2 by FDA under an Emergency Use Authorization (EUA). This EUA will remain in effect (meaning this test can be used) for the duration of the COVID-19 declaration under Section 564(b)(1) of the Act, 21 U.S.C. section 360bbb-3(b)(1), unless the authorization is terminated or revoked.     Resp Syncytial Virus by PCR NEGATIVE NEGATIVE Final    Comment: (NOTE) Fact Sheet for Patients: BloggerCourse.com  Fact Sheet for Healthcare Providers: SeriousBroker.it  This test is not yet approved or cleared by the [place]  FDA and has been authorized for detection and/or diagnosis of SARS-CoV-2 by FDA  under an Emergency Use Authorization (EUA). This EUA will remain in effect (meaning this test can be used) for the duration of  the COVID-19 declaration under Section 564(b)(1) of the Act, 21 U.S.C. section 360bbb-3(b)(1), unless the authorization is terminated or revoked.  Performed at [facility], [address], [place], [place] [address]   Urine Culture     Status: Abnormal   Collection Time: 12/30/23  1:05 PM   Specimen: Urine, Random  Result Value Ref Range Status   Specimen Description URINE, RANDOM  Final   Special Requests NONE Reflexed from [id number]  Final   Culture (A)  Final    >=100,000 COLONIES/mL GROUP B STREP(S.AGALACTIAE)ISOLATED TESTING AGAINST S. AGALACTIAE NOT ROUTINELY PERFORMED DUE TO PREDICTABILITY OF AMP/PEN/VAN SUSCEPTIBILITY. Performed at [facility], [address], [place], [place] [address]    Report Status 12/31/2023 FINAL  Final  Blood Culture (routine x 2)     Status: None   Collection Time: 12/30/23  5:00 PM   Specimen: BLOOD LEFT HAND  Result Value Ref Range Status   Specimen Description BLOOD LEFT HAND  Final   Special Requests   Final    BOTTLES DRAWN AEROBIC AND ANAEROBIC Blood Culture adequate volume   Culture   Final    NO GROWTH 5 DAYS Performed at [facility], [address], [place], [place] [address]    Report Status 01/04/2024 FINAL  Final  Culture, body fluid w Gram Stain-bottle     Status: None   Collection Time: 01/02/24 11:52 AM   Specimen: Peritoneal Washings  Result Value Ref Range Status   Specimen Description PERITONEAL  Final   Special Requests NONE  Final   Culture   Final    NO GROWTH 5 DAYS Performed at [facility], [name], [place], [place] [address]    Report Status 01/07/2024 FINAL  Final  Gram stain     Status: None   Collection Time: 01/02/24 11:52 AM   Specimen: Peritoneal Washings  Result Value Ref Range Status   Specimen Description PERITONEAL  Final   Special Requests NONE  Final   Gram Stain   Final    WBC PRESENT, PREDOMINANTLY MONONUCLEAR NO ORGANISMS SEEN CYTOSPIN SMEAR Performed at [facility] Lab, [address], [place], [place] [address]    Report Status 01/02/2024 FINAL  Final         Radiology Studies: DG Abd 1 View Result Date: 01/07/2024 EXAM: 1 VIEW XRAY OF THE ABDOMEN 01/07/2024 09:15:00 PM COMPARISON: Comparison with same day x-ray and CT 01/06/2024. CLINICAL HISTORY: Constipation. FINDINGS: BOWEL: Persistent dilation of the small bowel. Large colonic stool burden. SOFT TISSUES: No opaque urinary calculi. BONES: No acute osseous abnormality. IMPRESSION: 1. Persistent dilation of the small bowel. 2. Large colonic stool burden. Electronically signed by: Norman Gatlin MD 01/07/2024 09:23 PM EDT RP Workstation: [id number]   DG Abd 1 View Result Date: 01/07/2024 CLINICAL DATA:  Abdominal distension, enteric catheter placement EXAM: ABDOMEN - 1 VIEW COMPARISON:  01/06/2024 FINDINGS: Frontal view of the lower chest and upper abdomen demonstrates enteric catheter passing below diaphragm, tip and side port projecting over the gastric body. Continued distension of the large and small bowel, with large amount of retained stool throughout the colon again noted. Stable left pleural effusion. IMPRESSION: 1. Enteric catheter tip and side port projecting over the gastric body. 2. Stable distension of the large and small bowel compatible with ileus or distal obstruction.  Electronically Signed   By: Ozell Daring M.D.   On: 01/07/2024 15:23   IR ABDOMEN US  LIMITED Result Date: 01/07/2024 INDICATION: History of ESRD on hemodialysis. Moderate but decreasing amount of ascites on CT yesterday. Consult for paracentesis. EXAM: ULTRASOUND ABDOMEN LIMITED FINDINGS: Imaging of all 4 quadrants of the abdomen reveal no ascites IMPRESSION: No ascites seen in ultrasound.  No need for paracentesis. Performed by: Wyatt Pommier, PA-C Electronically Signed   By: CHRISTELLA.  Shick M.D.   On: 01/07/2024 14:25           LOS: 10 days   Time spent= 35 mins    Azarius Lambson JAYSON Dare, MD [facility]  If 7PM-7AM, please contact night-coverage  01/09/2024, 10:28 AM

## 2024-01-09 NOTE — Plan of Care (Signed)

## 2024-01-10 LAB — CBC
HCT: 21.5 % — ABNORMAL LOW (ref 36.0–46.0)
Hemoglobin: 7 g/dL — ABNORMAL LOW (ref 12.0–15.0)
MCH: 30.8 pg (ref 26.0–34.0)
MCHC: 32.6 g/dL (ref 30.0–36.0)
MCV: 94.7 fL (ref 80.0–100.0)
Platelets: 224 K/uL (ref 150–400)
RBC: 2.27 MIL/uL — ABNORMAL LOW (ref 3.87–5.11)
RDW: 15.5 % (ref 11.5–15.5)
WBC: 3.9 K/uL — ABNORMAL LOW (ref 4.0–10.5)
nRBC: 0 % (ref 0.0–0.2)

## 2024-01-10 MED ORDER — SIMETHICONE 80 MG PO CHEW
80.0000 mg | CHEWABLE_TABLET | Freq: Four times a day (QID) | ORAL | Status: AC
  Administered 2024-01-10 – 2024-01-11 (×5): 80 mg via ORAL
  Filled 2024-01-10 (×5): qty 1

## 2024-01-10 MED ORDER — OXYCODONE HCL 5 MG PO TABS
10.0000 mg | ORAL_TABLET | ORAL | Status: DC | PRN
  Administered 2024-01-10 – 2024-01-17 (×17): 10 mg via ORAL
  Filled 2024-01-10 (×16): qty 2

## 2024-01-10 MED ORDER — ACETAMINOPHEN 500 MG PO TABS
1000.0000 mg | ORAL_TABLET | Freq: Four times a day (QID) | ORAL | Status: DC
  Administered 2024-01-10 – 2024-01-17 (×23): 1000 mg via ORAL
  Filled 2024-01-10 (×25): qty 2

## 2024-01-10 MED ORDER — ARFORMOTEROL TARTRATE 15 MCG/2ML IN NEBU
15.0000 ug | INHALATION_SOLUTION | Freq: Two times a day (BID) | RESPIRATORY_TRACT | Status: DC
  Administered 2024-01-10 – 2024-01-13 (×6): 15 ug via RESPIRATORY_TRACT
  Filled 2024-01-10 (×6): qty 2

## 2024-01-10 MED ORDER — IPRATROPIUM-ALBUTEROL 0.5-2.5 (3) MG/3ML IN SOLN
3.0000 mL | Freq: Four times a day (QID) | RESPIRATORY_TRACT | Status: DC
  Administered 2024-01-10 – 2024-01-11 (×4): 3 mL via RESPIRATORY_TRACT
  Filled 2024-01-10 (×4): qty 3

## 2024-01-10 MED ORDER — ACETAMINOPHEN 325 MG PO TABS: 650.0000 mg | ORAL_TABLET | ORAL | Status: DC | PRN

## 2024-01-10 MED ORDER — BUDESONIDE 0.25 MG/2ML IN SUSP
0.2500 mg | Freq: Two times a day (BID) | RESPIRATORY_TRACT | Status: DC
  Administered 2024-01-10 – 2024-01-13 (×6): 0.25 mg via RESPIRATORY_TRACT
  Filled 2024-01-10 (×6): qty 2

## 2024-01-10 NOTE — Progress Notes (Signed)
 Writer attempted to remove pts dressing on fistula per HD nurse orders for day shift. Pt got irritated with how slowly writer was removing dressing, pt began to rip dressing off herself, when writer attempted to apply pressure to bleeding dressing pt attempted to swat at nurse. Rosaline RN assisted & pt kept screaming at writer & Rosaline to get out of her room. Pt refusing fall precaution measures including bed alarm despite education being given.

## 2024-01-10 NOTE — Plan of Care (Signed)
   Problem: Activity: Goal: Risk for activity intolerance will decrease Outcome: Progressing   Problem: Nutrition: Goal: Adequate nutrition will be maintained Outcome: Progressing   Problem: Elimination: Goal: Will not experience complications related to bowel motility Outcome: Progressing

## 2024-01-10 NOTE — Progress Notes (Signed)
 Patient ID: [name], female   DOB: Jan 06, 1961, 63 y.o.   MRN: [id number] [facility] Progress Note   Assessment/ Plan:   1.  Acute hypoxic respiratory failure: Appears to be in part from volume overload and decreased respiratory excursion secondary to severe ascites.  Improved status post hemodialysis/ultrafiltration and previous paracentesis. 2. ESRD: Continue MWF schedule while here in the hospital; she does not have an outpatient dialysis unit because of nonadherence with keeping scheduled appointments.  Underwent hemodialysis yesterday without problems. 3. Anemia: Hemoglobin and hematocrit improved status post PRBC transfusion, no overt blood loss noted.  Continue ESA. 4. CKD-MBD: Corrected calcium  and phosphorus levels both within acceptable range, monitor trend.  Tolerating current soft diet. 5.  Abdominal pain/distention: Without recurrent ascites noted on attempted paracentesis 3 days ago.  Begin simethicone  and encouraged ambulation. 6. Hypertension: Elevated blood pressures noted, will follow closely on current antihypertensive medications with minimal UF.  Subjective:   Complains of some abdominal distention/flatus and had a good bowel movement earlier this morning.   Objective:   BP (!) 150/60 (BP Location: Left Arm)   Pulse 81   Temp 98.4 F (36.9 C) (Oral)   Resp 16   Ht 5' 7 (1.702 m)   Wt 52.1 kg   SpO2 93%   BMI 17.99 kg/m   Physical Exam: Gen: Comfortably resting in bed, watching television.  In better spirits CVS: Pulse regular rhythm, normal rate, S1 and S2 normal Resp: Diminished breath sounds over bases, no rales/rhonchi Abd: Moderately distended and palpably soft to exam.  Tympanitic with normal bowel sounds. Ext: No lower extremity edema.  Right upper arm AV fistula with intact dressings  Labs: BMET Recent Labs  Lab 01/03/24 0840 01/04/24 0403 01/05/24 9145 01/06/24 0322 01/07/24 0327 01/08/24 0925 01/09/24 0824  NA 129* 128*  126* 129* 125* 130*  --   K 4.2 4.0 4.2 4.3 4.8 4.7  --   CL 94* 93* 91* 93* 91* 94*  --   CO2 24 25 20* 25 23 25   --   GLUCOSE 83 101* 109* 92 89 86  --   BUN 17 30* 38* 22 33* 23  --   CREATININE 2.81* 3.90* 4.48* 2.98* 3.90* 3.59*  --   CALCIUM  8.0* 8.2* 8.3* 8.3* 8.6* 8.3*  --   PHOS 3.1  --   --  3.2 4.5  --  3.6   CBC Recent Labs  Lab 01/07/24 0327 01/08/24 0925 01/09/24 0824 01/10/24 0238  WBC 6.3 6.0 6.1 3.9*  HGB 8.1* 8.1* 8.4* 7.0*  HCT 24.8* 25.0* 26.4* 21.5*  MCV 93.9 94.3 95.3 94.7  PLT 230 248 263 224     Medications:     amLODipine   10 mg Oral Daily   atorvastatin   10 mg Oral Daily   bisacodyl   10 mg Oral BID   calcitRIOL   0.25 mcg Oral Daily   carvedilol   6.25 mg Oral BID WC   Chlorhexidine  Gluconate Cloth  6 each Topical Q0600   Chlorhexidine  Gluconate Cloth  6 each Topical Q0600   Chlorhexidine  Gluconate Cloth  6 each Topical Q0600   Chlorhexidine  Gluconate Cloth  6 each Topical Q0600   darbepoetin (ARANESP ) injection - DIALYSIS  100 mcg Subcutaneous Once   docusate sodium   100 mg Oral BID   ferrous sulfate   325 mg Oral Q breakfast   gabapentin   100 mg Oral TID   guaiFENesin   600 mg Oral BID   heparin   5,000 Units Subcutaneous  Q8H   lactulose   30 g Oral BID   pantoprazole   40 mg Oral BID   polyethylene glycol  17 g Oral BID   sodium bicarbonate   1,300 mg Oral BID   sodium chloride  flush  3 mL Intravenous Q12H   sucralfate   1 g Oral TID WC & HS   traZODone   50 mg Oral QHS   Gordy Blanch, MD 01/10/2024, 8:18 AM

## 2024-01-10 NOTE — Progress Notes (Signed)
 Progress Note    [name]   FMW:[id number]  DOB: 1960-12-05  DOA: 12/30/2023     11 PCP: Pcp, No  Initial CC: Volume overload  Hospital Course: 63 y.o. female with PMH significant for ESRD on hemodialysis, Noncompliant with dialysis, homeless, COPD, Chronic diastolic CHF, Chronic pain syndrome, history of polysubstance use including cocaine, depression, GERD, alcohol  use, chronic tobacco use presented in the ED with abdominal distention and shortness of breath.    Assessment & Plan:  Acute epigastric pain with distention/Ileus Severe constipation - CT scan showing ileus/constipation.   - NG tube placed on 10/22 which patient has already removed.  Having bowel movements and slowly improving.  Continue aggressive bowel regimen - Continue soft diet  Ascites - No known history of ascites in the past but noted abdominal distention on presentation.  S/p paracentesis 10/17 with 2.8 L removed.   - Repeat attempt 10/22, no evidence of ascites - Continue volume control with dialysis  Acute hypoxic respiratory failure-resolved - Secondary to volume overload.  Initially placed on 3 L nasal cannula.  Able to be weaned down to room air   Acute exacerbation HFpEF/ESRD/volume overload - Noncompliant with HD the outpatient setting.  Homeless, comes to ED for HD periodically.  Nephrology on board.   COPD - Complaining of some wheezing and shortness of breath - Continue DuoNebs, budesonide , Brovana    Anemia of chronic kidney disease -Required 2 units PRBC so far during hospitalization - Hgb back down to 7 g/dL this morning -Trend 1 more day and if still low or lower tomorrow, may need transfusion -No obvious bleeding.  Etiology attributed to renal failure   Severe peripheral arterial disease/peripheral neuropathy - Pain not well-controlled. - Continue gabapentin  -Modifying oxycodone  for now -Change Tylenol  to scheduled   Possible urinary tract infection (POA) - UA noting LE,  WBCs, bacteria.  Urine culture grew GBS.  Completed Rocephin  course   Polysubstance abuse - Noted positive tox screen for cocaine on presentation.  Encourage cessation.   Left foot foreign body (POA) - Noted on imaging 11/2023.  Apparently a radiopaque BB as well as concern for a 3 mm needle tip between her 1st and 2nd toe.  Chronicity unclear.  Length of treatment: To patient's ongoing BL LEE pain.  No urgent need for surgical intervention at this time.  Does not appear to be acutely infected.   Goals of care - Patient is homeless.  TOC following  Interval History:  Resting in bed appearing somewhat anxious.  Still complaining of uncontrolled pain. Abdomen still distended but endorses having had a bowel movement that was soft yesterday.  Antimicrobials:   DVT prophylaxis:  heparin  injection 5,000 Units Start: 12/30/23 1715   Code Status:   Code Status: Limited: Do not attempt resuscitation (DNR) -DNR-LIMITED -Do Not Intubate/DNI   Mobility Assessment (Last 72 Hours)     Mobility Assessment     Row Name 01/10/24 1027 01/09/24 2050 01/09/24 2000 01/09/24 1130 01/09/24 0829   Does the patient have exclusion criteria? No - Perform mobility assessment No - Perform mobility assessment -- No - Perform mobility assessment No - Perform mobility assessment   What is the highest level of mobility based on the mobility assessment? Level 4 (Ambulates with assistance) - Balance while stepping forward/back - Complete Level 5 (Ambulates independently) - Balance while walking independently - Complete Level 1 (Bedfast) - Unable to balance while sitting on edge of bed Level 5 (Ambulates independently) - Balance while walking independently -  Complete Level 5 (Ambulates independently) - Balance while walking independently - Complete   Is the above level different from baseline mobility prior to current illness? -- No - Consider discontinuing PT/OT -- No - Consider discontinuing PT/OT No - Consider  discontinuing PT/OT    Row Name 01/08/24 2026 01/07/24 2211         Does the patient have exclusion criteria? No - Perform mobility assessment No - Perform mobility assessment      What is the highest level of mobility based on the mobility assessment? Level 5 (Ambulates independently) - Balance while walking independently - Complete Level 5 (Ambulates independently) - Balance while walking independently - Complete      Is the above level different from baseline mobility prior to current illness? -- No - Consider discontinuing PT/OT         Barriers to discharge: None Disposition Plan: Homeless HH orders placed: N/A Status is: Inpatient  Objective: Blood pressure 138/83, pulse 80, temperature (!) 97.4 F (36.3 C), resp. rate 19, height 5' 7 (1.702 m), weight 52.1 kg, SpO2 96%.  Examination:  Physical Exam Constitutional:      Comments: Unkempt and disheveled appearing  HENT:     Head: Normocephalic and atraumatic.     Mouth/Throat:     Mouth: Mucous membranes are moist.  Eyes:     Extraocular Movements: Extraocular movements intact.  Cardiovascular:     Rate and Rhythm: Normal rate and regular rhythm.  Pulmonary:     Effort: Pulmonary effort is normal. No respiratory distress.     Breath sounds: Normal breath sounds. No wheezing.  Abdominal:     General: Bowel sounds are normal. There is no distension.     Palpations: Abdomen is soft.     Tenderness: There is no abdominal tenderness.  Musculoskeletal:     Cervical back: Normal range of motion and neck supple.     Right lower leg: Edema (1+) present.     Left lower leg: Edema (1+) present.     Comments: Scattered scarring noted on lower extremities  Skin:    General: Skin is warm and dry.  Neurological:     General: No focal deficit present.     Mental Status: She is alert.  Psychiatric:        Mood and Affect: Mood is anxious.        Speech: Speech is rapid and pressured.      Consultants:   Nephrology  Procedures:    Data Reviewed: Results for orders placed or performed during the hospital encounter of 12/30/23 (from the past 24 hours)  CBC     Status: Abnormal   Collection Time: 01/10/24  2:38 AM  Result Value Ref Range   WBC 3.9 (L) 4.0 - 10.5 K/uL   RBC 2.27 (L) 3.87 - 5.11 MIL/uL   Hemoglobin 7.0 (L) 12.0 - 15.0 g/dL   HCT 78.4 (L) 63.9 - 53.9 %   MCV 94.7 80.0 - 100.0 fL   MCH 30.8 26.0 - 34.0 pg   MCHC 32.6 30.0 - 36.0 g/dL   RDW 84.4 88.4 - 84.4 %   Platelets 224 150 - 400 K/uL   nRBC 0.0 0.0 - 0.2 %    I have reviewed pertinent nursing notes, vitals, labs, and images as necessary. I have ordered labwork to follow up on as indicated.  I have reviewed the last notes from staff over past 24 hours. I have discussed patient's care plan and test results  with nursing staff, CM/SW, and other staff as appropriate.  Old records reviewed in assessment of this patient  Time spent: Greater than 50% of the 55 minute visit was spent in counseling/coordination of care for the patient as laid out in the A&P.   LOS: 11 days   Alm Apo, MD [facility] 01/10/2024, 1:23 PM

## 2024-01-11 DIAGNOSIS — Z91158 Patient's noncompliance with renal dialysis for other reason: Secondary | ICD-10-CM

## 2024-01-11 LAB — CBC WITH DIFFERENTIAL/PLATELET
Abs Immature Granulocytes: 0.02 K/uL (ref 0.00–0.07)
Basophils Absolute: 0 K/uL (ref 0.0–0.1)
Basophils Relative: 1 %
Eosinophils Absolute: 0.3 K/uL (ref 0.0–0.5)
Eosinophils Relative: 6 %
HCT: 21.4 % — ABNORMAL LOW (ref 36.0–46.0)
Hemoglobin: 6.9 g/dL — CL (ref 12.0–15.0)
Immature Granulocytes: 0 %
Lymphocytes Relative: 22 %
Lymphs Abs: 1 K/uL (ref 0.7–4.0)
MCH: 30.8 pg (ref 26.0–34.0)
MCHC: 32.2 g/dL (ref 30.0–36.0)
MCV: 95.5 fL (ref 80.0–100.0)
Monocytes Absolute: 0.7 K/uL (ref 0.1–1.0)
Monocytes Relative: 16 %
Neutro Abs: 2.5 K/uL (ref 1.7–7.7)
Neutrophils Relative %: 55 %
Platelets: 263 K/uL (ref 150–400)
RBC: 2.24 MIL/uL — ABNORMAL LOW (ref 3.87–5.11)
RDW: 15.5 % (ref 11.5–15.5)
WBC: 4.5 K/uL (ref 4.0–10.5)
nRBC: 0 % (ref 0.0–0.2)

## 2024-01-11 LAB — PREPARE RBC (CROSSMATCH)

## 2024-01-11 MED ORDER — IPRATROPIUM-ALBUTEROL 0.5-2.5 (3) MG/3ML IN SOLN
3.0000 mL | Freq: Two times a day (BID) | RESPIRATORY_TRACT | Status: DC
  Administered 2024-01-11 – 2024-01-13 (×4): 3 mL via RESPIRATORY_TRACT
  Filled 2024-01-11 (×4): qty 3

## 2024-01-11 MED ORDER — SODIUM CHLORIDE 0.9% IV SOLUTION: Freq: Once | INTRAVENOUS | Status: AC

## 2024-01-11 MED ORDER — MAGNESIUM CITRATE PO SOLN
1.0000 | Freq: Once | ORAL | Status: AC
  Administered 2024-01-11: 1 via ORAL
  Filled 2024-01-11: qty 296

## 2024-01-11 NOTE — Progress Notes (Signed)
 Change duoneb to BID per RT protocol assessment.

## 2024-01-11 NOTE — Progress Notes (Signed)
 Patient ID: [name], female   DOB: Jul 20, 1960, 63 y.o.   MRN: [id number] [facility] Progress Note   Assessment/ Plan:   1.  Acute hypoxic respiratory failure: Appears to be in part from volume overload and decreased respiratory excursion secondary to severe ascites.  Improved status post hemodialysis/ultrafiltration and previous paracentesis. 2. ESRD: Continue MWF schedule with next hemodialysis treatment ordered for tomorrow.  She does not have an outpatient dialysis unit because of nonadherence with keeping scheduled appointments.  3. Anemia: Hemoglobin and hematocrit improved status post PRBC transfusion, no overt blood loss noted.  Continue ESA. 4. CKD-MBD: Corrected calcium  and phosphorus levels both within acceptable range, monitor trend.  Tolerating current soft diet. 5.  Abdominal pain/distention: Without recurrent ascites noted on attempted paracentesis 3 days ago.  Continue current bowel regimen. 6. Hypertension: Elevated blood pressures noted, will follow closely on current antihypertensive medications with ultrafiltration on hemodialysis.  Subjective:   Reports some abdominal distention/discomfort and need to have a bowel movement.   Objective:   BP 136/71 (BP Location: Left Arm)   Pulse 73   Temp 98.4 F (36.9 C) (Oral)   Resp 16   Ht 5' 7 (1.702 m)   Wt 52.7 kg   SpO2 98%   BMI 18.20 kg/m   Physical Exam: Gen: Appears comfortable resting in bed, on phone ordering breakfast [facility]: Pulse regular rhythm, normal rate, S1 and S2 normal Resp: Diminished breath sounds over bases, no rales/rhonchi Abd: Moderately distended and palpably soft to exam.  Tympanitic with normal bowel sounds. Ext: No lower extremity edema.  Right upper arm AV fistula with intact dressings  Labs: BMET Recent Labs  Lab 01/05/24 0854 01/06/24 0322 01/07/24 0327 01/08/24 0925 01/09/24 0824  NA 126* 129* 125* 130*  --   K 4.2 4.3 4.8 4.7  --   CL 91* 93* 91* 94*  --   CO2  20* 25 23 25   --   GLUCOSE 109* 92 89 86  --   BUN 38* 22 33* 23  --   CREATININE 4.48* 2.98* 3.90* 3.59*  --   CALCIUM  8.3* 8.3* 8.6* 8.3*  --   PHOS  --  3.2 4.5  --  3.6   CBC Recent Labs  Lab 01/08/24 0925 01/09/24 0824 01/10/24 0238 01/11/24 0517  WBC 6.0 6.1 3.9* 4.5  NEUTROABS  --   --   --  2.5  HGB 8.1* 8.4* 7.0* 6.9*  HCT 25.0* 26.4* 21.5* 21.4*  MCV 94.3 95.3 94.7 95.5  PLT 248 263 224 263     Medications:     sodium chloride    Intravenous Once   acetaminophen   1,000 mg Oral QID   amLODipine   10 mg Oral Daily   arformoterol   15 mcg Nebulization BID   atorvastatin   10 mg Oral Daily   bisacodyl   10 mg Oral BID   budesonide  (PULMICORT ) nebulizer solution  0.25 mg Nebulization BID   calcitRIOL   0.25 mcg Oral Daily   carvedilol   6.25 mg Oral BID WC   Chlorhexidine  Gluconate Cloth  6 each Topical Q0600   Chlorhexidine  Gluconate Cloth  6 each Topical Q0600   Chlorhexidine  Gluconate Cloth  6 each Topical Q0600   Chlorhexidine  Gluconate Cloth  6 each Topical Q0600   darbepoetin (ARANESP ) injection - DIALYSIS  100 mcg Subcutaneous Once   docusate sodium   100 mg Oral BID   ferrous sulfate   325 mg Oral Q breakfast   gabapentin   100 mg Oral  TID   guaiFENesin   600 mg Oral BID   heparin   5,000 Units Subcutaneous Q8H   ipratropium-albuterol   3 mL Nebulization QID   lactulose   30 g Oral BID   pantoprazole   40 mg Oral BID   polyethylene glycol  17 g Oral BID   simethicone   80 mg Oral QID   sodium bicarbonate   1,300 mg Oral BID   sodium chloride  flush  3 mL Intravenous Q12H   sucralfate   1 g Oral TID WC & HS   traZODone   50 mg Oral QHS   Gordy Blanch, MD 01/11/2024, 7:39 AM

## 2024-01-11 NOTE — Plan of Care (Signed)

## 2024-01-11 NOTE — Progress Notes (Addendum)
 Progress Note    [name]   FMW:[id number]  DOB: [birth year]  DOA: 12/30/2023     12 PCP: Pcp, No  Initial CC: Volume overload  Hospital Course: 63 y.o. female with PMH significant for ESRD on hemodialysis, Noncompliant with dialysis, homeless, COPD, Chronic diastolic CHF, Chronic pain syndrome, history of polysubstance use including cocaine, depression, GERD, alcohol  use, chronic tobacco use presented in the ED with abdominal distention and shortness of breath.    Assessment & Plan:  Acute epigastric pain with distention/Ileus Severe constipation - CT scan showing ileus/constipation.   - NG tube placed on 10/22 which patient has already removed.  Having bowel movements and slowly improving - Continue aggressive bowel regimen - Continue soft diet  Ascites - No known history of ascites in the past but noted abdominal distention on presentation.  S/p paracentesis 10/17 with 2.8 L removed.   - Repeat attempt 10/22, no evidence of ascites - Continue volume control with dialysis  Anemia of chronic kidney disease -Required 2 units PRBC so far during hospitalization -No obvious bleeding.  Etiology attributed to renal failure - Hgb 6.9 g/dL this morning, getting 1 unit PRBC  Acute hypoxic respiratory failure-resolved - Secondary to volume overload.  Initially placed on 3 L nasal cannula.  Able to be weaned down to room air   Acute exacerbation HFpEF/ESRD/volume overload - Noncompliant with HD the outpatient setting.  Homeless, comes to ED for HD periodically.  Nephrology on board.   COPD - Complaining of some wheezing and shortness of breath - Continue DuoNebs, budesonide , Brovana    Severe peripheral arterial disease/peripheral neuropathy - Pain not well-controlled. - Continue gabapentin  -Modifying oxycodone  for now -Change Tylenol  to scheduled   Possible urinary tract infection (POA) - UA noting LE, WBCs, bacteria.  Urine culture grew GBS.  Completed Rocephin  course    Polysubstance abuse - Noted positive tox screen for cocaine on presentation.  Encourage cessation.   Left foot foreign body (POA) - Noted on imaging 11/2023.  Apparently a radiopaque BB as well as concern for a 3 mm needle tip between her 1st and 2nd toe.  Chronicity unclear.  Length of treatment: To patient's ongoing BL LEE pain.  No urgent need for surgical intervention at this time.  Does not appear to be acutely infected.   Goals of care - Patient is homeless.  TOC following  Interval History:  No events overnight.  Still pressured speech this morning but a little bit more calm appearing and not as agitated.  Pain seems to be more controlled this morning. Receiving 1 unit of blood when seen this morning. Next dialysis planned for tomorrow.  Antimicrobials:   DVT prophylaxis:  heparin  injection 5,000 Units Start: 12/30/23 1715   Code Status:   Code Status: Limited: Do not attempt resuscitation (DNR) -DNR-LIMITED -Do Not Intubate/DNI   Mobility Assessment (Last 72 Hours)     Mobility Assessment     Row Name 01/10/24 2259 01/10/24 1027 01/09/24 2050 01/09/24 2000 01/09/24 1130   Does the patient have exclusion criteria? No - Perform mobility assessment No - Perform mobility assessment No - Perform mobility assessment -- No - Perform mobility assessment   What is the highest level of mobility based on the mobility assessment? Level 4 (Ambulates with assistance) - Balance while stepping forward/back - Complete Level 4 (Ambulates with assistance) - Balance while stepping forward/back - Complete Level 5 (Ambulates independently) - Balance while walking independently - Complete Level 1 (Bedfast) - Unable to balance while  sitting on edge of bed Level 5 (Ambulates independently) - Balance while walking independently - Complete   Is the above level different from baseline mobility prior to current illness? -- -- No - Consider discontinuing PT/OT -- No - Consider discontinuing PT/OT    Row  Name 01/09/24 0829 01/08/24 2026         Does the patient have exclusion criteria? No - Perform mobility assessment No - Perform mobility assessment      What is the highest level of mobility based on the mobility assessment? Level 5 (Ambulates independently) - Balance while walking independently - Complete Level 5 (Ambulates independently) - Balance while walking independently - Complete      Is the above level different from baseline mobility prior to current illness? No - Consider discontinuing PT/OT --         Barriers to discharge: None Disposition Plan: Homeless HH orders placed: N/A Status is: Inpatient  Objective: Blood pressure 131/66, pulse 70, temperature 98.5 F (36.9 C), temperature source Oral, resp. rate 17, height 5' 7 (1.702 m), weight 52.7 kg, SpO2 100%.  Examination:  Physical Exam Constitutional:      Comments: Unkempt and disheveled appearing  HENT:     Head: Normocephalic and atraumatic.     Mouth/Throat:     Mouth: Mucous membranes are moist.  Eyes:     Extraocular Movements: Extraocular movements intact.  Cardiovascular:     Rate and Rhythm: Normal rate and regular rhythm.  Pulmonary:     Effort: Pulmonary effort is normal. No respiratory distress.     Breath sounds: Normal breath sounds. No wheezing.  Abdominal:     General: Abdomen is protuberant. Bowel sounds are normal. There is distension.     Palpations: Abdomen is soft.     Tenderness: There is generalized abdominal tenderness.  Musculoskeletal:     Cervical back: Normal range of motion and neck supple.     Right lower leg: Edema (1+) present.     Left lower leg: Edema (1+) present.     Comments: Scattered scarring noted on lower extremities  Skin:    General: Skin is warm and dry.  Neurological:     General: No focal deficit present.     Mental Status: She is alert.  Psychiatric:        Mood and Affect: Mood is anxious.        Speech: Speech is rapid and pressured.       Consultants:  Nephrology  Procedures:    Data Reviewed: Results for orders placed or performed during the hospital encounter of 12/30/23 (from the past 24 hours)  CBC with Differential/Platelet     Status: Abnormal   Collection Time: 01/11/24  5:17 AM  Result Value Ref Range   WBC 4.5 4.0 - 10.5 K/uL   RBC 2.24 (L) 3.87 - 5.11 MIL/uL   Hemoglobin 6.9 (LL) 12.0 - 15.0 g/dL   HCT 78.5 (L) 63.9 - 53.9 %   MCV 95.5 80.0 - 100.0 fL   MCH 30.8 26.0 - 34.0 pg   MCHC 32.2 30.0 - 36.0 g/dL   RDW 84.4 88.4 - 84.4 %   Platelets 263 150 - 400 K/uL   nRBC 0.0 0.0 - 0.2 %   Neutrophils Relative % 55 %   Neutro Abs 2.5 1.7 - 7.7 K/uL   Lymphocytes Relative 22 %   Lymphs Abs 1.0 0.7 - 4.0 K/uL   Monocytes Relative 16 %   Monocytes Absolute 0.7  0.1 - 1.0 K/uL   Eosinophils Relative 6 %   Eosinophils Absolute 0.3 0.0 - 0.5 K/uL   Basophils Relative 1 %   Basophils Absolute 0.0 0.0 - 0.1 K/uL   Immature Granulocytes 0 %   Abs Immature Granulocytes 0.02 0.00 - 0.07 K/uL  Type and screen [facility]     Status: None (Preliminary result)   Collection Time: 01/11/24  7:51 AM  Result Value Ref Range   ABO/RH(D) A POS    Antibody Screen NEG    Sample Expiration 01/14/2024,2359    Unit Number [id number]    Blood Component Type RED CELLS,LR    Unit division 00    Status of Unit ISSUED    Transfusion Status OK TO TRANSFUSE    Crossmatch Result      Compatible Performed at [facility], [address], [place], [place] [address]   Prepare RBC (crossmatch)     Status: None   Collection Time: 01/11/24  7:53 AM  Result Value Ref Range   Order Confirmation      ORDER PROCESSED BY BLOOD BANK Performed at [facility], [address] [place], [place] [address]     I have reviewed pertinent nursing notes, vitals, labs, and images as necessary. I have ordered labwork to follow up on as indicated.  I have reviewed the last notes from staff over past 24  hours. I have discussed patient's care plan and test results with nursing staff, CM/SW, and other staff as appropriate.  Old records reviewed in assessment of this patient  Time spent: Greater than 50% of the 55 minute visit was spent in counseling/coordination of care for the patient as laid out in the A&P.   LOS: 12 days   Alm Apo, MD [facility] 01/11/2024, 11:40 AM

## 2024-01-11 NOTE — Plan of Care (Signed)
  Problem: Clinical Measurements: Goal: Ability to maintain clinical measurements within normal limits will improve Outcome: Progressing Goal: Will remain free from infection Outcome: Progressing Goal: Diagnostic test results will improve Outcome: Progressing Goal: Respiratory complications will improve Outcome: Progressing Goal: Cardiovascular complication will be avoided Outcome: Progressing   Problem: Activity: Goal: Risk for activity intolerance will decrease Outcome: Progressing   Problem: Nutrition: Goal: Adequate nutrition will be maintained Outcome: Progressing   Problem: Elimination: Goal: Will not experience complications related to bowel motility Outcome: Progressing Goal: Will not experience complications related to urinary retention Outcome: Progressing

## 2024-01-12 LAB — BPAM RBC
Blood Product Expiration Date: 202511152359
ISSUE DATE / TIME: 202510261003
Unit Type and Rh: 6200

## 2024-01-12 LAB — TYPE AND SCREEN
ABO/RH(D): A POS
Antibody Screen: NEGATIVE
Unit division: 0

## 2024-01-12 LAB — CBC WITH DIFFERENTIAL/PLATELET
Abs Immature Granulocytes: 0.01 K/uL (ref 0.00–0.07)
Basophils Absolute: 0 K/uL (ref 0.0–0.1)
Basophils Relative: 1 %
Eosinophils Absolute: 0.4 K/uL (ref 0.0–0.5)
Eosinophils Relative: 7 %
HCT: 27.7 % — ABNORMAL LOW (ref 36.0–46.0)
Hemoglobin: 9.1 g/dL — ABNORMAL LOW (ref 12.0–15.0)
Immature Granulocytes: 0 %
Lymphocytes Relative: 22 %
Lymphs Abs: 1.1 K/uL (ref 0.7–4.0)
MCH: 30.7 pg (ref 26.0–34.0)
MCHC: 32.9 g/dL (ref 30.0–36.0)
MCV: 93.6 fL (ref 80.0–100.0)
Monocytes Absolute: 0.7 K/uL (ref 0.1–1.0)
Monocytes Relative: 13 %
Neutro Abs: 2.8 K/uL (ref 1.7–7.7)
Neutrophils Relative %: 57 %
Platelets: 309 K/uL (ref 150–400)
RBC: 2.96 MIL/uL — ABNORMAL LOW (ref 3.87–5.11)
RDW: 16.3 % — ABNORMAL HIGH (ref 11.5–15.5)
WBC: 4.9 K/uL (ref 4.0–10.5)
nRBC: 0 % (ref 0.0–0.2)

## 2024-01-12 LAB — RENAL FUNCTION PANEL
Albumin: 2.2 g/dL — ABNORMAL LOW (ref 3.5–5.0)
Anion gap: 12 (ref 5–15)
BUN: 30 mg/dL — ABNORMAL HIGH (ref 8–23)
CO2: 24 mmol/L (ref 22–32)
Calcium: 8.4 mg/dL — ABNORMAL LOW (ref 8.9–10.3)
Chloride: 95 mmol/L — ABNORMAL LOW (ref 98–111)
Creatinine, Ser: 4.4 mg/dL — ABNORMAL HIGH (ref 0.44–1.00)
GFR, Estimated: 11 mL/min — ABNORMAL LOW (ref >60)
Glucose, Bld: 66 mg/dL — ABNORMAL LOW (ref 70–99)
Phosphorus: 4 mg/dL (ref 2.5–4.6)
Potassium: 4.3 mmol/L (ref 3.5–5.1)
Sodium: 131 mmol/L — ABNORMAL LOW (ref 135–145)

## 2024-01-12 LAB — MAGNESIUM: Magnesium: 2.3 mg/dL (ref 1.7–2.4)

## 2024-01-12 MED ORDER — DARBEPOETIN ALFA 100 MCG/0.5ML IJ SOSY
100.0000 ug | PREFILLED_SYRINGE | Freq: Once | INTRAMUSCULAR | Status: AC
  Administered 2024-01-13: 100 ug via SUBCUTANEOUS
  Filled 2024-01-12: qty 0.5

## 2024-01-12 NOTE — Plan of Care (Signed)

## 2024-01-12 NOTE — Progress Notes (Signed)
 Progress Note    [name]   FMW:[id number]  DOB: 05-21-1960  DOA: 12/30/2023     13 PCP: Pcp, No  Initial CC: Volume overload  Hospital Course: 63 y.o. female with PMH significant for ESRD on hemodialysis, Noncompliant with dialysis, homeless, COPD, Chronic diastolic CHF, Chronic pain syndrome, history of polysubstance use including cocaine, depression, GERD, alcohol  use, chronic tobacco use presented in the ED with abdominal distention and shortness of breath.    Assessment & Plan:  Acute epigastric pain with distention/Ileus Severe constipation - CT scan showing ileus/constipation.   - NG tube placed on 10/22 which patient has already removed.  Having bowel movements and slowly improving - Continue aggressive bowel regimen - Continue soft diet  Ascites - No known history of ascites in the past but noted abdominal distention on presentation.  S/p paracentesis 10/17 with 2.8 L removed.   - Repeat attempt 10/22, no evidence of ascites - Continue volume control with dialysis  Anemia of chronic kidney disease -Required 3 units PRBC so far during hospitalization -No obvious bleeding.  Etiology attributed to renal failure - Hgb 9.1 g/dL this morning  Acute hypoxic respiratory failure-resolved - Secondary to volume overload.  Initially placed on 3 L nasal cannula.  Able to be weaned down to room air   Acute exacerbation HFpEF/ESRD/volume overload - Noncompliant with HD the outpatient setting.  Homeless, comes to ED for HD periodically.  Nephrology on board.   COPD - Complaining of some wheezing and shortness of breath - Continue DuoNebs, budesonide , Brovana    Severe peripheral arterial disease/peripheral neuropathy - Pain not well-controlled. - Continue gabapentin  -Modifying oxycodone  for now -Change Tylenol  to scheduled   Possible urinary tract infection (POA) - UA noting LE, WBCs, bacteria.  Urine culture grew GBS.  Completed Rocephin  course   Polysubstance  abuse - Noted positive tox screen for cocaine on presentation.  Encourage cessation.   Left foot foreign body (POA) - Noted on imaging 11/2023.  Apparently a radiopaque BB as well as concern for a 3 mm needle tip between her 1st and 2nd toe.  Chronicity unclear.  Length of treatment: To patient's ongoing BL LEE pain.  No urgent need for surgical intervention at this time.  Does not appear to be acutely infected.   Goals of care - Patient is homeless.  TOC following  Interval History:  Magnesium  citrate worked well yesterday.  Multiple bowel movements.  Abdomen distended this morning but softer.  Undergoing HD today. Medically she is stable at this point and disposition is next issue given homelessness and does not have a routine HD center.  Antimicrobials:   DVT prophylaxis:  heparin  injection 5,000 Units Start: 12/30/23 1715   Code Status:   Code Status: Limited: Do not attempt resuscitation (DNR) -DNR-LIMITED -Do Not Intubate/DNI   Mobility Assessment (Last 72 Hours)     Mobility Assessment     Row Name 01/11/24 2012 01/11/24 0846 01/10/24 2259 01/10/24 1027 01/09/24 2050   Does the patient have exclusion criteria? No - Perform mobility assessment No - Perform mobility assessment No - Perform mobility assessment No - Perform mobility assessment No - Perform mobility assessment   What is the highest level of mobility based on the mobility assessment? Level 4 (Ambulates with assistance) - Balance while stepping forward/back - Complete Level 4 (Ambulates with assistance) - Balance while stepping forward/back - Complete Level 4 (Ambulates with assistance) - Balance while stepping forward/back - Complete Level 4 (Ambulates with assistance) - Balance while  stepping forward/back - Complete Level 5 (Ambulates independently) - Balance while walking independently - Complete   Is the above level different from baseline mobility prior to current illness? -- Yes - Recommend PT order -- -- No -  Consider discontinuing PT/OT    Row Name 01/09/24 2000 01/09/24 1130         Does the patient have exclusion criteria? -- No - Perform mobility assessment      What is the highest level of mobility based on the mobility assessment? Level 1 (Bedfast) - Unable to balance while sitting on edge of bed Level 5 (Ambulates independently) - Balance while walking independently - Complete      Is the above level different from baseline mobility prior to current illness? -- No - Consider discontinuing PT/OT         Barriers to discharge: None Disposition Plan: Homeless HH orders placed: N/A Status is: Inpatient  Objective: Blood pressure 128/62, pulse 71, temperature (!) 97.3 F (36.3 C), resp. rate 13, height 5' 7 (1.702 m), weight 58.3 kg, SpO2 98%.  Examination:  Physical Exam Constitutional:      Comments: Unkempt and disheveled appearing  HENT:     Head: Normocephalic and atraumatic.     Mouth/Throat:     Mouth: Mucous membranes are moist.  Eyes:     Extraocular Movements: Extraocular movements intact.  Cardiovascular:     Rate and Rhythm: Normal rate and regular rhythm.  Pulmonary:     Effort: Pulmonary effort is normal. No respiratory distress.     Breath sounds: Normal breath sounds. No wheezing.  Abdominal:     General: Abdomen is protuberant. Bowel sounds are normal. There is distension.     Palpations: Abdomen is soft.     Tenderness: There is generalized abdominal tenderness.  Musculoskeletal:     Cervical back: Normal range of motion and neck supple.     Right lower leg: Edema (1+) present.     Left lower leg: Edema (1+) present.     Comments: Scattered scarring noted on lower extremities  Skin:    General: Skin is warm and dry.  Neurological:     General: No focal deficit present.     Mental Status: She is alert.  Psychiatric:        Mood and Affect: Mood is anxious.        Speech: Speech is rapid and pressured.      Consultants:  Nephrology  Procedures:     Data Reviewed: Results for orders placed or performed during the hospital encounter of 12/30/23 (from the past 24 hours)  CBC with Differential/Platelet     Status: Abnormal   Collection Time: 01/12/24  4:39 AM  Result Value Ref Range   WBC 4.9 4.0 - 10.5 K/uL   RBC 2.96 (L) 3.87 - 5.11 MIL/uL   Hemoglobin 9.1 (L) 12.0 - 15.0 g/dL   HCT 72.2 (L) 63.9 - 53.9 %   MCV 93.6 80.0 - 100.0 fL   MCH 30.7 26.0 - 34.0 pg   MCHC 32.9 30.0 - 36.0 g/dL   RDW 83.6 (H) 88.4 - 84.4 %   Platelets 309 150 - 400 K/uL   nRBC 0.0 0.0 - 0.2 %   Neutrophils Relative % 57 %   Neutro Abs 2.8 1.7 - 7.7 K/uL   Lymphocytes Relative 22 %   Lymphs Abs 1.1 0.7 - 4.0 K/uL   Monocytes Relative 13 %   Monocytes Absolute 0.7 0.1 - 1.0 K/uL  Eosinophils Relative 7 %   Eosinophils Absolute 0.4 0.0 - 0.5 K/uL   Basophils Relative 1 %   Basophils Absolute 0.0 0.0 - 0.1 K/uL   Immature Granulocytes 0 %   Abs Immature Granulocytes 0.01 0.00 - 0.07 K/uL  Magnesium      Status: None   Collection Time: 01/12/24  4:39 AM  Result Value Ref Range   Magnesium  2.3 1.7 - 2.4 mg/dL  Renal function panel     Status: Abnormal   Collection Time: 01/12/24  4:39 AM  Result Value Ref Range   Sodium 131 (L) 135 - 145 mmol/L   Potassium 4.3 3.5 - 5.1 mmol/L   Chloride 95 (L) 98 - 111 mmol/L   CO2 24 22 - 32 mmol/L   Glucose, Bld 66 (L) 70 - 99 mg/dL   BUN 30 (H) 8 - 23 mg/dL   Creatinine, Ser 5.59 (H) 0.44 - 1.00 mg/dL   Calcium  8.4 (L) 8.9 - 10.3 mg/dL   Phosphorus 4.0 2.5 - 4.6 mg/dL   Albumin  2.2 (L) 3.5 - 5.0 g/dL   GFR, Estimated 11 (L) >60 mL/min   Anion gap 12 5 - 15    I have reviewed pertinent nursing notes, vitals, labs, and images as necessary. I have ordered labwork to follow up on as indicated.  I have reviewed the last notes from staff over past 24 hours. I have discussed patient's care plan and test results with nursing staff, CM/SW, and other staff as appropriate.  Old records reviewed in assessment  of this patient  Time spent: Greater than 50% of the 55 minute visit was spent in counseling/coordination of care for the patient as laid out in the A&P.   LOS: 13 days   Alm Apo, MD [facility] 01/12/2024, 10:27 AM

## 2024-01-12 NOTE — Progress Notes (Signed)
 1.8 liters ultrafiltration, condition stable post hemodialysis and report was given to the primary RN.

## 2024-01-12 NOTE — Progress Notes (Signed)
 [facility] Progress Note   Subjective:   Seen at start of HD. Anxious about being on dialysis. Reports diarrhea yesterday. Denies SOB, CP, dizziness.   Objective Vitals:   01/12/24 0950 01/12/24 1000 01/12/24 1030 01/12/24 1101  BP: (!) 139/97 128/62 138/74 132/64  Pulse: 76 71 78 68  Resp: 14 13 15 10   Temp:      TempSrc:      SpO2: 97% 98% 97% 100%  Weight:      Height:       Physical Exam General: Alert female in NAD Heart: RRR, no murmurs, rubs or gallops Lungs: CTA bilaterally, respirations unlabored Abdomen: Soft, + distention  Extremities: No edema b/l lower extremities Dialysis Access:  RUE AVF accessed  Additional Objective Labs: Basic Metabolic Panel: Recent Labs  Lab 01/07/24 0327 01/08/24 0925 01/09/24 0824 01/12/24 0439  NA 125* 130*  --  131*  K 4.8 4.7  --  4.3  CL 91* 94*  --  95*  CO2 23 25  --  24  GLUCOSE 89 86  --  66*  BUN 33* 23  --  30*  CREATININE 3.90* 3.59*  --  4.40*  CALCIUM  8.6* 8.3*  --  8.4*  PHOS 4.5  --  3.6 4.0   Liver Function Tests: Recent Labs  Lab 01/06/24 0322 01/07/24 0327 01/12/24 0439  ALBUMIN  2.2* 2.3* 2.2*   No results for input(s): LIPASE, AMYLASE in the last 168 hours. CBC: Recent Labs  Lab 01/08/24 0925 01/09/24 0824 01/10/24 0238 01/11/24 0517 01/12/24 0439  WBC 6.0 6.1 3.9* 4.5 4.9  NEUTROABS  --   --   --  2.5 2.8  HGB 8.1* 8.4* 7.0* 6.9* 9.1*  HCT 25.0* 26.4* 21.5* 21.4* 27.7*  MCV 94.3 95.3 94.7 95.5 93.6  PLT 248 263 224 263 309   Blood Culture    Component Value Date/Time   SDES PERITONEAL 01/02/2024 1152   SDES PERITONEAL 01/02/2024 1152   SPECREQUEST NONE 01/02/2024 1152   SPECREQUEST NONE 01/02/2024 1152   CULT  01/02/2024 1152    NO GROWTH 5 DAYS Performed at [facility], [address], [place], [place] [address]    REPTSTATUS 01/07/2024 FINAL 01/02/2024 1152   REPTSTATUS 01/02/2024 FINAL 01/02/2024 1152    Cardiac Enzymes: No results for  input(s): CKTOTAL, CKMB, CKMBINDEX, TROPONINI in the last 168 hours. CBG: No results for input(s): GLUCAP in the last 168 hours. Iron  Studies: No results for input(s): IRON , TIBC, TRANSFERRIN, FERRITIN in the last 72 hours. @lablastinr3 @ Studies/Results: No results found. Medications:   acetaminophen   1,000 mg Oral QID   amLODipine   10 mg Oral Daily   arformoterol   15 mcg Nebulization BID   atorvastatin   10 mg Oral Daily   bisacodyl   10 mg Oral BID   budesonide  (PULMICORT ) nebulizer solution  0.25 mg Nebulization BID   calcitRIOL   0.25 mcg Oral Daily   carvedilol   6.25 mg Oral BID WC   Chlorhexidine  Gluconate Cloth  6 each Topical Q0600   Chlorhexidine  Gluconate Cloth  6 each Topical Q0600   Chlorhexidine  Gluconate Cloth  6 each Topical Q0600   Chlorhexidine  Gluconate Cloth  6 each Topical Q0600   darbepoetin (ARANESP ) injection - DIALYSIS  100 mcg Subcutaneous Once   docusate sodium   100 mg Oral BID   ferrous sulfate   325 mg Oral Q breakfast   gabapentin   100 mg Oral TID   guaiFENesin   600 mg Oral BID   heparin   5,000 Units Subcutaneous Q8H   ipratropium-albuterol   3 mL Nebulization BID   lactulose   30 g Oral BID   pantoprazole   40 mg Oral BID   polyethylene glycol  17 g Oral BID   sodium bicarbonate   1,300 mg Oral BID   sodium chloride  flush  3 mL Intravenous Q12H   sucralfate   1 g Oral TID WC & HS   traZODone   50 mg Oral QHS    Dialysis Orders: no outpatient center  Assessment/Plan: 1.  Acute hypoxic respiratory failure: Appears to be in part from volume overload and decreased respiratory excursion secondary to severe ascites.  Improved status post hemodialysis/ultrafiltration and previous paracentesis. 2. ESRD: Continue MWF schedule.  She does not have an outpatient dialysis unit because of nonadherence with keeping scheduled appointments (we had arranged transportation to her hotel at one point but she still did not attend HD) 3. Anemia: Hemoglobin and  hematocrit improved status post PRBC transfusion, no overt blood loss noted.  Continue ESA. 4. CKD-MBD: Corrected calcium  and phosphorus levels both within acceptable range, monitor trend.  Tolerating current soft diet. 5.  Abdominal pain/distention: Without recurrent ascites noted on attempted repeat paracentesis. Continue current bowel regimen. 6. Hypertension: Elevated blood pressures noted, will follow closely on current antihypertensive medications with ultrafiltration on hemodialysis.    Lucie Collet, PA-C 01/12/2024, 11:04 AM  [facility] Pager: [phone]

## 2024-01-12 NOTE — Progress Notes (Signed)
 Pt's case discussed with team this morning. Should pt have a d/c plan in place (shelter, motel,etc) and pt is agreeable to out-pt HD clinic placement/treatments, navigator can assist with out-pt HD referrals. Pt has not been agreeable to out-pt HD in the past. Will assist as needed.   Randine Mungo Dialysis Navigator [phone]

## 2024-01-13 LAB — CBC WITH DIFFERENTIAL/PLATELET
Abs Immature Granulocytes: 0.02 K/uL (ref 0.00–0.07)
Basophils Absolute: 0.1 K/uL (ref 0.0–0.1)
Basophils Relative: 1 %
Eosinophils Absolute: 0.3 K/uL (ref 0.0–0.5)
Eosinophils Relative: 6 %
HCT: 27.1 % — ABNORMAL LOW (ref 36.0–46.0)
Hemoglobin: 8.8 g/dL — ABNORMAL LOW (ref 12.0–15.0)
Immature Granulocytes: 0 %
Lymphocytes Relative: 23 %
Lymphs Abs: 1.2 K/uL (ref 0.7–4.0)
MCH: 30.2 pg (ref 26.0–34.0)
MCHC: 32.5 g/dL (ref 30.0–36.0)
MCV: 93.1 fL (ref 80.0–100.0)
Monocytes Absolute: 0.6 K/uL (ref 0.1–1.0)
Monocytes Relative: 12 %
Neutro Abs: 3 K/uL (ref 1.7–7.7)
Neutrophils Relative %: 58 %
Platelets: 333 K/uL (ref 150–400)
RBC: 2.91 MIL/uL — ABNORMAL LOW (ref 3.87–5.11)
RDW: 16.4 % — ABNORMAL HIGH (ref 11.5–15.5)
WBC: 5.1 K/uL (ref 4.0–10.5)
nRBC: 0 % (ref 0.0–0.2)

## 2024-01-13 LAB — RENAL FUNCTION PANEL
Albumin: 2.3 g/dL — ABNORMAL LOW (ref 3.5–5.0)
Anion gap: 13 (ref 5–15)
BUN: 20 mg/dL (ref 8–23)
CO2: 25 mmol/L (ref 22–32)
Calcium: 8.3 mg/dL — ABNORMAL LOW (ref 8.9–10.3)
Chloride: 93 mmol/L — ABNORMAL LOW (ref 98–111)
Creatinine, Ser: 3.05 mg/dL — ABNORMAL HIGH (ref 0.44–1.00)
GFR, Estimated: 17 mL/min — ABNORMAL LOW (ref >60)
Glucose, Bld: 85 mg/dL (ref 70–99)
Phosphorus: 3.1 mg/dL (ref 2.5–4.6)
Potassium: 4.3 mmol/L (ref 3.5–5.1)
Sodium: 131 mmol/L — ABNORMAL LOW (ref 135–145)

## 2024-01-13 LAB — MAGNESIUM: Magnesium: 2 mg/dL (ref 1.7–2.4)

## 2024-01-13 MED ORDER — CHLORHEXIDINE GLUCONATE CLOTH 2 % EX PADS: 6.0000 | MEDICATED_PAD | Freq: Every day | CUTANEOUS | Status: DC

## 2024-01-13 MED ORDER — BUDESON-GLYCOPYRROL-FORMOTEROL 160-9-4.8 MCG/ACT IN AERO
2.0000 | INHALATION_SPRAY | Freq: Two times a day (BID) | RESPIRATORY_TRACT | Status: DC
  Administered 2024-01-13 – 2024-01-17 (×8): 2 via RESPIRATORY_TRACT
  Filled 2024-01-13: qty 5.9

## 2024-01-13 NOTE — Progress Notes (Signed)
 Progress Note    [name]   FMW:[id number]  DOB: 13-Aug-1960  DOA: 12/30/2023     14 PCP: Pcp, No  Initial CC: Volume overload  Hospital Course: 63 y.o. female with PMH significant for ESRD on hemodialysis, Noncompliant with dialysis, homeless, COPD, Chronic diastolic CHF, Chronic pain syndrome, history of polysubstance use including cocaine, depression, GERD, alcohol  use, chronic tobacco use presented in the ED with abdominal distention and shortness of breath.    Assessment & Plan:  Acute epigastric pain with distention/Ileus Severe constipation - CT scan showing ileus/constipation.   - NG tube placed on 10/22 which patient has already removed.  Having bowel movements and slowly improving - Continue aggressive bowel regimen - Continue soft diet  Ascites - No known history of ascites in the past but noted abdominal distention on presentation.  S/p paracentesis 10/17 with 2.8 L removed.   - Repeat attempt 10/22, no evidence of ascites - Continue volume control with dialysis  ESRD on HD - Homeless, comes to ED for HD periodically.  Nephrology on board. - Again offered her HD center on 10/28 but she states ambiguity with hotels and would not be able to commit to a center at this time still  - plan will be discharge to homelessness again; gets paid Friday and says she'd go to a hotel again if unable to go to a shelter   Anemia of chronic kidney disease -Required 3 units PRBC so far during hospitalization -No obvious bleeding.  Etiology attributed to renal failure - Hgb remains stable for now, 8.8 g/dL this morning  Acute hypoxic respiratory failure-resolved - Secondary to volume overload.  Initially placed on 3 L nasal cannula.  Able to be weaned down to room air   Acute exacerbation HFpEF - Noncompliant with HD the outpatient setting -Improved with dialysis on admission  COPD - Complaining of some wheezing and shortness of breath - Continue DuoNebs, budesonide ,  Brovana    Severe peripheral arterial disease/peripheral neuropathy - Pain not well-controlled. - Continue gabapentin  -Modifying oxycodone  for now -Change Tylenol  to scheduled   Possible urinary tract infection (POA) - UA noting LE, WBCs, bacteria.  Urine culture grew GBS.  Completed Rocephin  course   Polysubstance abuse - Noted positive tox screen for cocaine on presentation.  Encourage cessation.   Left foot foreign body (POA) - Noted on imaging 11/2023.  Apparently a radiopaque BB as well as concern for a 3 mm needle tip between her 1st and 2nd toe.  Chronicity unclear.  Length of treatment: To patient's ongoing BL LEE pain.  No urgent need for surgical intervention at this time.  Does not appear to be acutely infected.   Goals of care - Patient is homeless.  TOC following  Interval History:  Overall stable and improved. Having bowel movements and tolerating diet well.  Has been getting dialyzed while hospitalized. Tentative plan is possibly discharge on Friday after she gets paid if no shelter able to be found prior to that time. She still does not wish to commit to a dialysis center.  Antimicrobials:   DVT prophylaxis:  heparin  injection 5,000 Units Start: 12/30/23 1715   Code Status:   Code Status: Limited: Do not attempt resuscitation (DNR) -DNR-LIMITED -Do Not Intubate/DNI   Mobility Assessment (Last 72 Hours)     Mobility Assessment     Row Name 01/12/24 2200 01/12/24 0815 01/11/24 2012 01/11/24 0846 01/10/24 2259   Does the patient have exclusion criteria? No - Perform mobility assessment No -  Perform mobility assessment No - Perform mobility assessment No - Perform mobility assessment No - Perform mobility assessment   What is the highest level of mobility based on the mobility assessment? Level 4 (Ambulates with assistance) - Balance while stepping forward/back - Complete Level 4 (Ambulates with assistance) - Balance while stepping forward/back - Complete Level 4  (Ambulates with assistance) - Balance while stepping forward/back - Complete Level 4 (Ambulates with assistance) - Balance while stepping forward/back - Complete Level 4 (Ambulates with assistance) - Balance while stepping forward/back - Complete   Is the above level different from baseline mobility prior to current illness? Yes - Recommend PT order -- -- Yes - Recommend PT order --      Barriers to discharge: None Disposition Plan: Homeless HH orders placed: N/A Status is: Inpatient  Objective: Blood pressure (!) 145/71, pulse 87, temperature 98.9 F (37.2 C), resp. rate 16, height 5' 7 (1.702 m), weight 57.8 kg, SpO2 93%.  Examination:  Physical Exam Constitutional:      Comments: Unkempt and disheveled appearing  HENT:     Head: Normocephalic and atraumatic.     Mouth/Throat:     Mouth: Mucous membranes are moist.  Eyes:     Extraocular Movements: Extraocular movements intact.  Cardiovascular:     Rate and Rhythm: Normal rate and regular rhythm.  Pulmonary:     Effort: Pulmonary effort is normal. No respiratory distress.     Breath sounds: Normal breath sounds. No wheezing.  Abdominal:     General: Abdomen is protuberant. Bowel sounds are normal. There is distension.     Palpations: Abdomen is soft.     Tenderness: There is generalized abdominal tenderness.  Musculoskeletal:     Cervical back: Normal range of motion and neck supple.     Right lower leg: Edema (trace) present.     Left lower leg: Edema (trace) present.     Comments: Scattered scarring noted on lower extremities  Skin:    General: Skin is warm and dry.  Neurological:     General: No focal deficit present.     Mental Status: She is alert.  Psychiatric:        Mood and Affect: Mood is anxious.        Speech: Speech is rapid and pressured.      Consultants:  Nephrology  Procedures:    Data Reviewed: Results for orders placed or performed during the hospital encounter of 12/30/23 (from the past 24  hours)  CBC with Differential/Platelet     Status: Abnormal   Collection Time: 01/13/24  4:36 AM  Result Value Ref Range   WBC 5.1 4.0 - 10.5 K/uL   RBC 2.91 (L) 3.87 - 5.11 MIL/uL   Hemoglobin 8.8 (L) 12.0 - 15.0 g/dL   HCT 72.8 (L) 63.9 - 53.9 %   MCV 93.1 80.0 - 100.0 fL   MCH 30.2 26.0 - 34.0 pg   MCHC 32.5 30.0 - 36.0 g/dL   RDW 83.5 (H) 88.4 - 84.4 %   Platelets 333 150 - 400 K/uL   nRBC 0.0 0.0 - 0.2 %   Neutrophils Relative % 58 %   Neutro Abs 3.0 1.7 - 7.7 K/uL   Lymphocytes Relative 23 %   Lymphs Abs 1.2 0.7 - 4.0 K/uL   Monocytes Relative 12 %   Monocytes Absolute 0.6 0.1 - 1.0 K/uL   Eosinophils Relative 6 %   Eosinophils Absolute 0.3 0.0 - 0.5 K/uL   Basophils Relative  1 %   Basophils Absolute 0.1 0.0 - 0.1 K/uL   Immature Granulocytes 0 %   Abs Immature Granulocytes 0.02 0.00 - 0.07 K/uL  Magnesium      Status: None   Collection Time: 01/13/24  4:36 AM  Result Value Ref Range   Magnesium  2.0 1.7 - 2.4 mg/dL  Renal function panel     Status: Abnormal   Collection Time: 01/13/24  4:36 AM  Result Value Ref Range   Sodium 131 (L) 135 - 145 mmol/L   Potassium 4.3 3.5 - 5.1 mmol/L   Chloride 93 (L) 98 - 111 mmol/L   CO2 25 22 - 32 mmol/L   Glucose, Bld 85 70 - 99 mg/dL   BUN 20 8 - 23 mg/dL   Creatinine, Ser 6.94 (H) 0.44 - 1.00 mg/dL   Calcium  8.3 (L) 8.9 - 10.3 mg/dL   Phosphorus 3.1 2.5 - 4.6 mg/dL   Albumin  2.3 (L) 3.5 - 5.0 g/dL   GFR, Estimated 17 (L) >60 mL/min   Anion gap 13 5 - 15    I have reviewed pertinent nursing notes, vitals, labs, and images as necessary. I have ordered labwork to follow up on as indicated.  I have reviewed the last notes from staff over past 24 hours. I have discussed patient's care plan and test results with nursing staff, CM/SW, and other staff as appropriate.  Old records reviewed in assessment of this patient  Time spent: Greater than 50% of the 55 minute visit was spent in counseling/coordination of care for the  patient as laid out in the A&P.   LOS: 14 days   Alm Apo, MD [facility] 01/13/2024, 12:03 PM

## 2024-01-13 NOTE — Progress Notes (Signed)
 Pt does not want breathing treatment at this time, pt states she prefers to eat at this time.  No distress noted.

## 2024-01-13 NOTE — Plan of Care (Signed)

## 2024-01-13 NOTE — Progress Notes (Signed)
 [facility] Progress Note   Subjective:   Seen in room, reports wheezing. Noted she refused breathing treatment earlier today. She is frustrated by her abdominal distention. Denies SOB, CP, dizziness, nausea.   Objective Vitals:   01/13/24 0423 01/13/24 0424 01/13/24 0426 01/13/24 0758  BP:  (!) 152/69  (!) 159/75  Pulse:  72  77  Resp:  16    Temp:  98 F (36.7 C)  (!) 97.4 F (36.3 C)  TempSrc:    Oral  SpO2: 96% 96%  91%  Weight:   57.8 kg   Height:       Physical Exam General: Alert female in NAD Heart: RRR, no murmurs, rubs or gallops Lungs: CTA bilaterally, respirations unlabored Abdomen: Soft, + distention  Extremities: No edema b/l lower extremities Dialysis Access:  RUE AVF +t/b   Additional Objective Labs: Basic Metabolic Panel: Recent Labs  Lab 01/08/24 0925 01/09/24 0824 01/12/24 0439 01/13/24 0436  NA 130*  --  131* 131*  K 4.7  --  4.3 4.3  CL 94*  --  95* 93*  CO2 25  --  24 25  GLUCOSE 86  --  66* 85  BUN 23  --  30* 20  CREATININE 3.59*  --  4.40* 3.05*  CALCIUM  8.3*  --  8.4* 8.3*  PHOS  --  3.6 4.0 3.1   Liver Function Tests: Recent Labs  Lab 01/07/24 0327 01/12/24 0439 01/13/24 0436  ALBUMIN  2.3* 2.2* 2.3*   No results for input(s): LIPASE, AMYLASE in the last 168 hours. CBC: Recent Labs  Lab 01/09/24 0824 01/10/24 0238 01/11/24 0517 01/12/24 0439 01/13/24 0436  WBC 6.1 3.9* 4.5 4.9 5.1  NEUTROABS  --   --  2.5 2.8 3.0  HGB 8.4* 7.0* 6.9* 9.1* 8.8*  HCT 26.4* 21.5* 21.4* 27.7* 27.1*  MCV 95.3 94.7 95.5 93.6 93.1  PLT 263 224 263 309 333   Blood Culture    Component Value Date/Time   SDES PERITONEAL 01/02/2024 1152   SDES PERITONEAL 01/02/2024 1152   SPECREQUEST NONE 01/02/2024 1152   SPECREQUEST NONE 01/02/2024 1152   CULT  01/02/2024 1152    NO GROWTH 5 DAYS Performed at [facility], [address] [place], [place] [address]    REPTSTATUS 01/07/2024 FINAL 01/02/2024 1152   REPTSTATUS  01/02/2024 FINAL 01/02/2024 1152    Cardiac Enzymes: No results for input(s): CKTOTAL, CKMB, CKMBINDEX, TROPONINI in the last 168 hours. CBG: No results for input(s): GLUCAP in the last 168 hours. Iron  Studies: No results for input(s): IRON , TIBC, TRANSFERRIN, FERRITIN in the last 72 hours. @lablastinr3 @ Studies/Results: No results found. Medications:   acetaminophen   1,000 mg Oral QID   amLODipine   10 mg Oral Daily   arformoterol   15 mcg Nebulization BID   atorvastatin   10 mg Oral Daily   bisacodyl   10 mg Oral BID   budesonide  (PULMICORT ) nebulizer solution  0.25 mg Nebulization BID   calcitRIOL   0.25 mcg Oral Daily   carvedilol   6.25 mg Oral BID WC   Chlorhexidine  Gluconate Cloth  6 each Topical Q0600   Chlorhexidine  Gluconate Cloth  6 each Topical Q0600   Chlorhexidine  Gluconate Cloth  6 each Topical Q0600   Chlorhexidine  Gluconate Cloth  6 each Topical Q0600   darbepoetin (ARANESP ) injection - DIALYSIS  100 mcg Subcutaneous Once   docusate sodium   100 mg Oral BID   ferrous sulfate   325 mg Oral Q breakfast   gabapentin   100  mg Oral TID   guaiFENesin   600 mg Oral BID   heparin   5,000 Units Subcutaneous Q8H   ipratropium-albuterol   3 mL Nebulization BID   lactulose   30 g Oral BID   pantoprazole   40 mg Oral BID   polyethylene glycol  17 g Oral BID   sodium bicarbonate   1,300 mg Oral BID   sodium chloride  flush  3 mL Intravenous Q12H   sucralfate   1 g Oral TID WC & HS   traZODone   50 mg Oral QHS    Dialysis Orders: no outpatient center   Assessment/Plan: 1.  Acute hypoxic respiratory failure: Appears to be in part from volume overload and decreased respiratory excursion secondary to severe ascites.  Improved status post hemodialysis/ultrafiltration and previous paracentesis. 2. ESRD: Continue MWF schedule.  She does not have an outpatient dialysis unit because of nonadherence with keeping scheduled appointments (we had arranged transportation to her hotel  at one point but she still did not attend HD) 3. Anemia: Hemoglobin and hematocrit improved status post PRBC transfusion, no overt blood loss noted.  Continue ESA. 4. CKD-MBD: Corrected calcium  and phosphorus levels both within acceptable range, monitor trend.  Tolerating current soft diet. 5.  Abdominal pain/distention: Without recurrent ascites noted on attempted repeat paracentesis. Continue current bowel regimen per PMD 6. Hypertension: Elevated blood pressures noted, will follow closely on current antihypertensive medications with ultrafiltration on hemodialysis.  Lucie Collet, PA-C 01/13/2024, 11:45 AM  [facility] Pager: [phone]

## 2024-01-14 LAB — RENAL FUNCTION PANEL
Albumin: 2.6 g/dL — ABNORMAL LOW (ref 3.5–5.0)
Anion gap: 13 (ref 5–15)
BUN: 31 mg/dL — ABNORMAL HIGH (ref 8–23)
CO2: 23 mmol/L (ref 22–32)
Calcium: 9 mg/dL (ref 8.9–10.3)
Chloride: 90 mmol/L — ABNORMAL LOW (ref 98–111)
Creatinine, Ser: 3.69 mg/dL — ABNORMAL HIGH (ref 0.44–1.00)
GFR, Estimated: 13 mL/min — ABNORMAL LOW (ref >60)
Glucose, Bld: 83 mg/dL (ref 70–99)
Phosphorus: 4.2 mg/dL (ref 2.5–4.6)
Potassium: 5.1 mmol/L (ref 3.5–5.1)
Sodium: 126 mmol/L — ABNORMAL LOW (ref 135–145)

## 2024-01-14 LAB — CBC WITH DIFFERENTIAL/PLATELET
Abs Immature Granulocytes: 0.02 K/uL (ref 0.00–0.07)
Basophils Absolute: 0.1 K/uL (ref 0.0–0.1)
Basophils Relative: 1 %
Eosinophils Absolute: 0.3 K/uL (ref 0.0–0.5)
Eosinophils Relative: 5 %
HCT: 28.3 % — ABNORMAL LOW (ref 36.0–46.0)
Hemoglobin: 8.9 g/dL — ABNORMAL LOW (ref 12.0–15.0)
Immature Granulocytes: 0 %
Lymphocytes Relative: 17 %
Lymphs Abs: 1.2 K/uL (ref 0.7–4.0)
MCH: 30.5 pg (ref 26.0–34.0)
MCHC: 31.4 g/dL (ref 30.0–36.0)
MCV: 96.9 fL (ref 80.0–100.0)
Monocytes Absolute: 0.8 K/uL (ref 0.1–1.0)
Monocytes Relative: 11 %
Neutro Abs: 4.7 K/uL (ref 1.7–7.7)
Neutrophils Relative %: 66 %
Platelets: 349 K/uL (ref 150–400)
RBC: 2.92 MIL/uL — ABNORMAL LOW (ref 3.87–5.11)
RDW: 16 % — ABNORMAL HIGH (ref 11.5–15.5)
WBC: 7.1 K/uL (ref 4.0–10.5)
nRBC: 0 % (ref 0.0–0.2)

## 2024-01-14 LAB — MAGNESIUM: Magnesium: 1.9 mg/dL (ref 1.7–2.4)

## 2024-01-14 MED ORDER — ALPRAZOLAM 0.25 MG PO TABS
0.2500 mg | ORAL_TABLET | Freq: Once | ORAL | Status: AC
  Administered 2024-01-14: 0.25 mg via ORAL
  Filled 2024-01-14: qty 1

## 2024-01-14 NOTE — Progress Notes (Signed)
 [facility] Progress Note   Subjective:   Seen at start of HD. Feels like her wheezing/ breathing is better. Denies CP and SOB.   Objective Vitals:   01/14/24 0452 01/14/24 0824 01/14/24 0832 01/14/24 1233  BP:  (!) 142/69  (!) 142/72  Pulse:  87  80  Resp:    19  Temp:  98.5 F (36.9 C)  98.1 F (36.7 C)  TempSrc:    Oral  SpO2:  90% 92% 94%  Weight: 50.8 kg   59.3 kg  Height:       Physical Exam General: Alert female in NAD Heart: RRR, no murmurs, rubs or gallops Lungs: CTA bilaterally, respirations unlabored Abdomen: Soft, + distention  Extremities: No edema b/l lower extremities Dialysis Access:  RUE AVF +t/b  Additional Objective Labs: Basic Metabolic Panel: Recent Labs  Lab 01/12/24 0439 01/13/24 0436 01/14/24 0341  NA 131* 131* 126*  K 4.3 4.3 5.1  CL 95* 93* 90*  CO2 24 25 23   GLUCOSE 66* 85 83  BUN 30* 20 31*  CREATININE 4.40* 3.05* 3.69*  CALCIUM  8.4* 8.3* 9.0  PHOS 4.0 3.1 4.2   Liver Function Tests: Recent Labs  Lab 01/12/24 0439 01/13/24 0436 01/14/24 0341  ALBUMIN  2.2* 2.3* 2.6*   No results for input(s): LIPASE, AMYLASE in the last 168 hours. CBC: Recent Labs  Lab 01/10/24 0238 01/10/24 0238 01/11/24 0517 01/12/24 0439 01/13/24 0436 01/14/24 0341  WBC 3.9*  --  4.5 4.9 5.1 7.1  NEUTROABS  --    < > 2.5 2.8 3.0 4.7  HGB 7.0*  --  6.9* 9.1* 8.8* 8.9*  HCT 21.5*  --  21.4* 27.7* 27.1* 28.3*  MCV 94.7  --  95.5 93.6 93.1 96.9  PLT 224  --  263 309 333 349   < > = values in this interval not displayed.   Blood Culture    Component Value Date/Time   SDES PERITONEAL 01/02/2024 1152   SDES PERITONEAL 01/02/2024 1152   SPECREQUEST NONE 01/02/2024 1152   SPECREQUEST NONE 01/02/2024 1152   CULT  01/02/2024 1152    NO GROWTH 5 DAYS Performed at [facility], [address], [place], [place] [address]    REPTSTATUS 01/07/2024 FINAL 01/02/2024 1152   REPTSTATUS 01/02/2024 FINAL 01/02/2024 1152    Cardiac  Enzymes: No results for input(s): CKTOTAL, CKMB, CKMBINDEX, TROPONINI in the last 168 hours. CBG: No results for input(s): GLUCAP in the last 168 hours. Iron  Studies: No results for input(s): IRON , TIBC, TRANSFERRIN, FERRITIN in the last 72 hours. @lablastinr3 @ Studies/Results: No results found. Medications:   acetaminophen   1,000 mg Oral QID   amLODipine   10 mg Oral Daily   atorvastatin   10 mg Oral Daily   bisacodyl   10 mg Oral BID   budesonide -glycopyrrolate -formoterol   2 puff Inhalation BID   calcitRIOL   0.25 mcg Oral Daily   carvedilol   6.25 mg Oral BID WC   Chlorhexidine  Gluconate Cloth  6 each Topical Q0600   docusate sodium   100 mg Oral BID   ferrous sulfate   325 mg Oral Q breakfast   gabapentin   100 mg Oral TID   guaiFENesin   600 mg Oral BID   heparin   5,000 Units Subcutaneous Q8H   pantoprazole   40 mg Oral BID   polyethylene glycol  17 g Oral BID   sodium bicarbonate   1,300 mg Oral BID   sodium chloride  flush  3 mL Intravenous Q12H   traZODone   50 mg  Oral QHS    Assessment/Plan: 1.  Acute hypoxic respiratory failure: Appears to be in part from volume overload and decreased respiratory excursion secondary to severe ascites.  Improved status post hemodialysis/ultrafiltration and previous paracentesis. 2. ESRD: Continue MWF schedule.  She does not have an outpatient dialysis unit because of nonadherence with keeping scheduled appointments (we had arranged transportation to her hotel at one point but she still did not attend HD) 3. Anemia: Hemoglobin and hematocrit improved status post PRBC transfusion, no overt blood loss noted.  Continue ESA. 4. CKD-MBD: Corrected calcium  and phosphorus levels both within acceptable range, monitor trend.  Tolerating current soft diet. 5.  Abdominal pain/distention: Without recurrent ascites noted on attempted repeat paracentesis. Continue current bowel regimen per PMD 6. Hypertension: Elevated blood pressures noted, will  follow closely on current antihypertensive medications with ultrafiltration on hemodialysis.  Lucie Collet, PA-C 01/14/2024, 12:37 PM  [facility] Pager: [phone]

## 2024-01-14 NOTE — Progress Notes (Addendum)
 Transition of Care [facility]) - Inpatient Brief Assessment   Patient Details  Name: [name] MRN: [id number] Date of Birth: Oct 21, 1960  Transition of Care [facility] CM/SW Contact:    Rosaline JONELLE Joe, RN Phone Number: 01/14/2024, 9:39 AM   Clinical Narrative: Patient admitted with heart failure and shock - due to noncompliance with Outpatient.  Patient is homeless and plan is for patient to discharge to shelter/hotel on Friday per MD notes.  Resources placed in the AVS for OP substance abuse, shelter resources, transportation and food insecurity resources.   Transition of Care Asessment: Insurance and Status: (P) Insurance coverage has been reviewed Patient has primary care physician: (P) No Home environment has been reviewed: (P) Homeless Prior level of function:: (P) self Prior/Current Home Services: (P) No current home services Social Drivers of Health Review: (P) SDOH reviewed interventions complete Readmission risk has been reviewed: (P) Yes Transition of care needs: (P) transition of care needs identified, TOC will continue to follow

## 2024-01-14 NOTE — Progress Notes (Signed)
 Received patient in bed to unit.  Alert and oriented.  Informed consent signed and in chart.   TX duration:3.5   Patient tolerated well.  Transported back to the room  Alert, without acute distress.  Hand-off given to patient's nurse.   Access used: Right Upper arm Fistula Access issues: none  Total UF removed: 2L Medication(s) given: none   01/14/24 1616  Vitals  Temp 98 F (36.7 C)  Temp Source Oral  BP (!) 186/107  MAP (mmHg) 118  Pulse Rate 81  ECG Heart Rate 80  Resp (!) 24  Weight 57.3 kg  Type of Weight Post-Dialysis  Oxygen  Therapy  SpO2 94 %  O2 Device Room Air  Patient Activity (if Appropriate) In bed  Pulse Oximetry Type Continuous  Oximetry Probe Site Changed No  During Treatment Monitoring  Duration of HD Treatment -hour(s) 3.5 hour(s)  HD Safety Checks Performed Yes  Intra-Hemodialysis Comments Tolerated well;Tx completed  Post Treatment  Dialyzer Clearance Clear  Liters Processed 84  Fluid Removed (mL) 2000 mL  Tolerated HD Treatment Yes  Post-Hemodialysis Comments tolerated well  Fistula / Graft Right Upper arm Arteriovenous fistula  Placement Date/Time: 08/14/22 1230   Orientation: Right  Access Location: (c) Upper arm  Access Type: Arteriovenous fistula  Site Condition No complications  Fistula / Graft Assessment Present;Thrill;Bruit  Status Patent;Deaccessed  Drainage Description None     [name] LPN [facility]

## 2024-01-14 NOTE — Plan of Care (Signed)
 Pt off unit x 4 hours for HD. 2L off. Slightly hypertensive. Gave scheduled BP med. Emotional and anxious earlier in shift. Requested an extra dose of alprazolam  from provider and administered.  Problem: Education: Goal: Knowledge of General Education information will improve Description: Including pain rating scale, medication(s)/side effects and non-pharmacologic comfort measures Outcome: Progressing   Problem: Health Behavior/Discharge Planning: Goal: Ability to manage health-related needs will improve Outcome: Progressing   Problem: Clinical Measurements: Goal: Ability to maintain clinical measurements within normal limits will improve Outcome: Progressing Goal: Will remain free from infection Outcome: Progressing Goal: Diagnostic test results will improve Outcome: Progressing Goal: Respiratory complications will improve Outcome: Progressing Goal: Cardiovascular complication will be avoided Outcome: Progressing   Problem: Activity: Goal: Risk for activity intolerance will decrease Outcome: Progressing   Problem: Nutrition: Goal: Adequate nutrition will be maintained Outcome: Progressing   Problem: Coping: Goal: Level of anxiety will decrease Outcome: Progressing   Problem: Elimination: Goal: Will not experience complications related to bowel motility Outcome: Progressing Goal: Will not experience complications related to urinary retention Outcome: Progressing   Problem: Pain Managment: Goal: General experience of comfort will improve and/or be controlled Outcome: Progressing   Problem: Safety: Goal: Ability to remain free from injury will improve Outcome: Progressing   Problem: Skin Integrity: Goal: Risk for impaired skin integrity will decrease Outcome: Progressing   Problem: Education: Goal: Knowledge of General Education information will improve Description: Including pain rating scale, medication(s)/side effects and non-pharmacologic comfort  measures Outcome: Progressing   Problem: Health Behavior/Discharge Planning: Goal: Ability to manage health-related needs will improve Outcome: Progressing   Problem: Clinical Measurements: Goal: Ability to maintain clinical measurements within normal limits will improve Outcome: Progressing Goal: Will remain free from infection Outcome: Progressing Goal: Diagnostic test results will improve Outcome: Progressing Goal: Respiratory complications will improve Outcome: Progressing Goal: Cardiovascular complication will be avoided Outcome: Progressing   Problem: Activity: Goal: Risk for activity intolerance will decrease Outcome: Progressing   Problem: Nutrition: Goal: Adequate nutrition will be maintained Outcome: Progressing   Problem: Coping: Goal: Level of anxiety will decrease Outcome: Progressing   Problem: Elimination: Goal: Will not experience complications related to bowel motility Outcome: Progressing Goal: Will not experience complications related to urinary retention Outcome: Progressing   Problem: Pain Managment: Goal: General experience of comfort will improve and/or be controlled Outcome: Progressing   Problem: Safety: Goal: Ability to remain free from injury will improve Outcome: Progressing   Problem: Skin Integrity: Goal: Risk for impaired skin integrity will decrease Outcome: Progressing

## 2024-01-14 NOTE — Progress Notes (Signed)
 PROGRESS NOTE    [name]  FMW:[id number] DOB: 1960/11/19 DOA: 12/30/2023 PCP: Pcp, No     Brief Narrative:  [name] is a 63 y.o. female with PMH significant for ESRD on hemodialysis, noncompliant with dialysis, homeless, COPD, chronic diastolic CHF, chronic pain syndrome, history of polysubstance use including cocaine, depression, GERD, alcohol  use, chronic tobacco use presented in the ED with abdominal distention and shortness of breath. She was seen by Nephrology and underwent HD.   New events last 24 hours / Subjective: Very emotional this morning when I asked her about possible discharge plans. Stated that no one will help her and she has no clothes and no place to go. Tells me that her checks are not enough to cover food after paying for hotel costs. Wants assistance from social work - I sent secure chat to have [facility] discuss with patient on what we can do.   Assessment & Plan:  Principal Problem:   Noncompliance with renal dialysis Active Problems:   Chronic diastolic CHF (congestive heart failure) (HCC)   ESRD on dialysis (HCC)   Anemia of chronic disease   Essential hypertension   COPD (chronic obstructive pulmonary disease) (HCC)   Anxiety and depression   Hyperlipidemia   Tobacco abuse   Polysubstance abuse (HCC)   Homelessness   IDA (iron  deficiency anemia)   Chronic pain syndrome    Acute epigastric pain with distention/Ileus Severe constipation - CT scan showing ileus/constipation.   - NG tube placed on 10/22 which patient has already removed.  Having bowel movements and slowly improving - Continue aggressive bowel regimen - Continue soft diet   Ascites - No known history of ascites in the past but noted abdominal distention on presentation.  S/p paracentesis 10/17 with 2.8 L removed.   - Repeat attempt 10/22, no evidence of ascites - Continue volume control with dialysis   ESRD on HD MWF  - Homeless, comes to ED for HD periodically.  Nephrology  on board. Does not commit to outpatient HD center    Anemia of chronic kidney disease - Required 3 units PRBC so far during hospitalization - No obvious bleeding.  Etiology attributed to renal failure - Stable     Acute exacerbation HFpEF - Noncompliant with HD the outpatient setting - Improved with dialysis on admission   COPD - Complaining of some wheezing and shortness of breath - Continue DuoNebs, budesonide , Brovana    Severe peripheral arterial disease/peripheral neuropathy - Continue gabapentin , oxycodone    Possible urinary tract infection (POA) - UA noting LE, WBCs, bacteria.  Urine culture grew GBS.  Completed Rocephin  course   Polysubstance abuse - Noted positive tox screen for cocaine on presentation.  Encourage cessation.   Left foot foreign body (POA) - Noted on imaging 11/2023.  Apparently a radiopaque BB as well as concern for a 3 mm needle tip between her 1st and 2nd toe.  Chronicity unclear.  Length of treatment: To patient's ongoing BL LEE pain.  No urgent need for surgical intervention at this time.  Does not appear to be acutely infected.  Anxiety - Xanax  PRN     DVT prophylaxis:  heparin  injection 5,000 Units Start: 12/30/23 1715  Code Status: DNR Family Communication: None at bedside Disposition Plan: Homeless  Status is: Inpatient Remains inpatient appropriate because: Disposition     Antimicrobials:  Anti-infectives (From admission, onward)    Start     Dose/Rate Route Frequency Ordered Stop   12/31/23 1700  cefTRIAXone  (ROCEPHIN ) 1  g in sodium chloride  0.9 % 100 mL IVPB  Status:  Discontinued        1 g 200 mL/hr over 30 Minutes Intravenous Every 24 hours 12/30/23 1707 01/07/24 1025   12/30/23 1615  cefTRIAXone  (ROCEPHIN ) 1 g in sodium chloride  0.9 % 100 mL IVPB        1 g 200 mL/hr over 30 Minutes Intravenous  Once 12/30/23 1612 12/30/23 1742        Objective: Vitals:   01/14/24 0832 01/14/24 1233 01/14/24 1240 01/14/24 1300  BP:   (!) 142/72 (!) 144/63 (!) 120/100  Pulse:  80 79 77  Resp:  19 15 17   Temp:  98.1 F (36.7 C)    TempSrc:  Oral    SpO2: 92% 94% 93% 94%  Weight:  59.3 kg    Height:        Intake/Output Summary (Last 24 hours) at 01/14/2024 1350 Last data filed at 01/14/2024 0824 Gross per 24 hour  Intake 840 ml  Output --  Net 840 ml   Filed Weights   01/13/24 0426 01/14/24 0452 01/14/24 1233  Weight: 57.8 kg 50.8 kg 59.3 kg    Examination:  General exam: Appears emotional, crying hysterically  Central nervous system: Alert  Extremities: Symmetric in appearance     Data Reviewed: I have personally reviewed following labs and imaging studies  CBC: Recent Labs  Lab 01/10/24 0238 01/11/24 0517 01/12/24 0439 01/13/24 0436 01/14/24 0341  WBC 3.9* 4.5 4.9 5.1 7.1  NEUTROABS  --  2.5 2.8 3.0 4.7  HGB 7.0* 6.9* 9.1* 8.8* 8.9*  HCT 21.5* 21.4* 27.7* 27.1* 28.3*  MCV 94.7 95.5 93.6 93.1 96.9  PLT 224 263 309 333 349   Basic Metabolic Panel: Recent Labs  Lab 01/08/24 0925 01/09/24 0824 01/12/24 0439 01/13/24 0436 01/14/24 0341  NA 130*  --  131* 131* 126*  K 4.7  --  4.3 4.3 5.1  CL 94*  --  95* 93* 90*  CO2 25  --  24 25 23   GLUCOSE 86  --  66* 85 83  BUN 23  --  30* 20 31*  CREATININE 3.59*  --  4.40* 3.05* 3.69*  CALCIUM  8.3*  --  8.4* 8.3* 9.0  MG  --  2.1 2.3 2.0 1.9  PHOS  --  3.6 4.0 3.1 4.2   GFR: Estimated Creatinine Clearance: 14.6 mL/min (A) (by C-G formula based on SCr of 3.69 mg/dL (H)). Liver Function Tests: Recent Labs  Lab 01/12/24 0439 01/13/24 0436 01/14/24 0341  ALBUMIN  2.2* 2.3* 2.6*   No results for input(s): LIPASE, AMYLASE in the last 168 hours. No results for input(s): AMMONIA in the last 168 hours. Coagulation Profile: No results for input(s): INR, PROTIME in the last 168 hours. Cardiac Enzymes: No results for input(s): CKTOTAL, CKMB, CKMBINDEX, TROPONINI in the last 168 hours. BNP (last 3 results) No results for  input(s): PROBNP in the last 8760 hours. HbA1C: No results for input(s): HGBA1C in the last 72 hours. CBG: No results for input(s): GLUCAP in the last 168 hours. Lipid Profile: No results for input(s): CHOL, HDL, LDLCALC, TRIG, CHOLHDL, LDLDIRECT in the last 72 hours. Thyroid Function Tests: No results for input(s): TSH, T4TOTAL, FREET4, T3FREE, THYROIDAB in the last 72 hours. Anemia Panel: No results for input(s): VITAMINB12, FOLATE, FERRITIN, TIBC, IRON , RETICCTPCT in the last 72 hours. Sepsis Labs: No results for input(s): PROCALCITON, LATICACIDVEN in the last 168 hours.  No results found for this  or any previous visit (from the past 240 hours).    Radiology Studies: No results found.    Scheduled Meds:  acetaminophen   1,000 mg Oral QID   amLODipine   10 mg Oral Daily   atorvastatin   10 mg Oral Daily   bisacodyl   10 mg Oral BID   budesonide -glycopyrrolate -formoterol   2 puff Inhalation BID   calcitRIOL   0.25 mcg Oral Daily   carvedilol   6.25 mg Oral BID WC   Chlorhexidine  Gluconate Cloth  6 each Topical Q0600   docusate sodium   100 mg Oral BID   ferrous sulfate   325 mg Oral Q breakfast   gabapentin   100 mg Oral TID   guaiFENesin   600 mg Oral BID   heparin   5,000 Units Subcutaneous Q8H   pantoprazole   40 mg Oral BID   polyethylene glycol  17 g Oral BID   sodium bicarbonate   1,300 mg Oral BID   sodium chloride  flush  3 mL Intravenous Q12H   traZODone   50 mg Oral QHS   Continuous Infusions:   LOS: 15 days   Time spent: 40 minutes   Delon Hoe, DO [facility] 01/14/2024, 1:50 PM   Available via Epic secure chat 7am-7pm After these hours, please refer to coverage provider listed on [email or web address]

## 2024-01-15 LAB — RENAL FUNCTION PANEL
Albumin: 2.3 g/dL — ABNORMAL LOW (ref 3.5–5.0)
Anion gap: 12 (ref 5–15)
BUN: 18 mg/dL (ref 8–23)
CO2: 27 mmol/L (ref 22–32)
Calcium: 8.4 mg/dL — ABNORMAL LOW (ref 8.9–10.3)
Chloride: 92 mmol/L — ABNORMAL LOW (ref 98–111)
Creatinine, Ser: 2.45 mg/dL — ABNORMAL HIGH (ref 0.44–1.00)
GFR, Estimated: 22 mL/min — ABNORMAL LOW (ref >60)
Glucose, Bld: 94 mg/dL (ref 70–99)
Phosphorus: 3.9 mg/dL (ref 2.5–4.6)
Potassium: 4.6 mmol/L (ref 3.5–5.1)
Sodium: 131 mmol/L — ABNORMAL LOW (ref 135–145)

## 2024-01-15 LAB — CBC WITH DIFFERENTIAL/PLATELET
Abs Immature Granulocytes: 0.04 K/uL (ref 0.00–0.07)
Basophils Absolute: 0.1 K/uL (ref 0.0–0.1)
Basophils Relative: 1 %
Eosinophils Absolute: 0.3 K/uL (ref 0.0–0.5)
Eosinophils Relative: 4 %
HCT: 24.9 % — ABNORMAL LOW (ref 36.0–46.0)
Hemoglobin: 7.8 g/dL — ABNORMAL LOW (ref 12.0–15.0)
Immature Granulocytes: 1 %
Lymphocytes Relative: 18 %
Lymphs Abs: 1.1 K/uL (ref 0.7–4.0)
MCH: 30.6 pg (ref 26.0–34.0)
MCHC: 31.3 g/dL (ref 30.0–36.0)
MCV: 97.6 fL (ref 80.0–100.0)
Monocytes Absolute: 0.7 K/uL (ref 0.1–1.0)
Monocytes Relative: 12 %
Neutro Abs: 3.9 K/uL (ref 1.7–7.7)
Neutrophils Relative %: 64 %
Platelets: 314 K/uL (ref 150–400)
RBC: 2.55 MIL/uL — ABNORMAL LOW (ref 3.87–5.11)
RDW: 15.9 % — ABNORMAL HIGH (ref 11.5–15.5)
WBC: 6 K/uL (ref 4.0–10.5)
nRBC: 0 % (ref 0.0–0.2)

## 2024-01-15 LAB — MAGNESIUM: Magnesium: 1.8 mg/dL (ref 1.7–2.4)

## 2024-01-15 MED ORDER — CHLORHEXIDINE GLUCONATE CLOTH 2 % EX PADS
6.0000 | MEDICATED_PAD | Freq: Every day | CUTANEOUS | Status: DC
  Administered 2024-01-16: 6 via TOPICAL

## 2024-01-15 NOTE — Progress Notes (Addendum)
 PROGRESS NOTE    [name]  FMW:[id number] DOB: [birth year] DOA: 12/30/2023 PCP: Pcp, No     Brief Narrative:  [name] is a 63 y.o. female with PMH significant for ESRD on hemodialysis, noncompliant with dialysis, homeless, COPD, chronic diastolic CHF, chronic pain syndrome, history of polysubstance use including cocaine, depression, GERD, alcohol  use, chronic tobacco use presented in the ED with abdominal distention and shortness of breath. She was seen by Nephrology and underwent HD.   New events last 24 hours / Subjective: Patient was very worked up, yelling at me during the exam. Upset that no one is helping her, states she can't go to motel (previously had stated she plans to go to motel since she gets a check on Friday), states she's not doing HD on Friday. Tells me that she needs social services (offered her our [facility] team, who is trying to set up case manager for outpatient).   Assessment & Plan:  Principal Problem:   Noncompliance with renal dialysis Active Problems:   Chronic diastolic CHF (congestive heart failure) (HCC)   ESRD on dialysis (HCC)   Anemia of chronic disease   Essential hypertension   COPD (chronic obstructive pulmonary disease) (HCC)   Anxiety and depression   Hyperlipidemia   Tobacco abuse   Polysubstance abuse (HCC)   Homelessness   IDA (iron  deficiency anemia)   Chronic pain syndrome    Acute epigastric pain with distention/Ileus Severe constipation - CT scan showing ileus/constipation.   - NG tube placed on 10/22 which patient has already removed.  Having bowel movements and slowly improving - Continue aggressive bowel regimen - Continue soft diet   Cirrhosis with ascites - No known history of ascites in the past but noted abdominal distention on presentation.  S/p paracentesis 10/17 with 2.8 L removed.   - Repeat attempt 10/22, no evidence of ascites - Continue volume control with dialysis - Cirrhosis seen on CT. Hep C positive back  in 2016. Need outpatient follow up.    ESRD on HD MWF  - Homeless, comes to ED for HD periodically.  Nephrology on board. Does not commit to outpatient HD center. Discussed with Dr. Geralynn today.    Anemia of chronic kidney disease - Required 3 units PRBC so far during hospitalization - No obvious bleeding.  Etiology attributed to renal failure - Stable     Acute exacerbation HFpEF - Noncompliant with HD the outpatient setting - Improved with dialysis on admission   COPD - Complaining of some wheezing and shortness of breath - Continue DuoNebs, budesonide , Brovana    Severe peripheral arterial disease/peripheral neuropathy - Continue gabapentin , oxycodone    Possible urinary tract infection (POA) - UA noting LE, WBCs, bacteria.  Urine culture grew GBS.  Completed Rocephin  course   Polysubstance abuse - Noted positive tox screen for cocaine on presentation.  Encourage cessation.   Left foot foreign body (POA) - Noted on imaging 11/2023.  Apparently a radiopaque BB as well as concern for a 3 mm needle tip between her 1st and 2nd toe.  Chronicity unclear.  Length of treatment: To patient's ongoing BL LEE pain.  No urgent need for surgical intervention at this time.  Does not appear to be acutely infected.  Anxiety - Xanax  PRN     DVT prophylaxis:  heparin  injection 5,000 Units Start: 12/30/23 1715  Code Status: DNR Family Communication: None at bedside Disposition Plan: Homeless  Status is: Inpatient Remains inpatient appropriate because: Disposition. Discussed with TOC team today.  Antimicrobials:  Anti-infectives (From admission, onward)    Start     Dose/Rate Route Frequency Ordered Stop   12/31/23 1700  cefTRIAXone  (ROCEPHIN ) 1 g in sodium chloride  0.9 % 100 mL IVPB  Status:  Discontinued        1 g 200 mL/hr over 30 Minutes Intravenous Every 24 hours 12/30/23 1707 01/07/24 1025   12/30/23 1615  cefTRIAXone  (ROCEPHIN ) 1 g in sodium chloride  0.9 % 100 mL IVPB         1 g 200 mL/hr over 30 Minutes Intravenous  Once 12/30/23 1612 12/30/23 1742        Objective: Vitals:   01/15/24 0417 01/15/24 0431 01/15/24 0747 01/15/24 1124  BP: 138/81  (!) 145/79 (!) 151/76  Pulse: 82  82 79  Resp: 17  18 18   Temp: 98 F (36.7 C)  98.1 F (36.7 C) (!) 97.2 F (36.2 C)  TempSrc: Oral     SpO2: 95% 95% 94% 100%  Weight:  59.8 kg    Height:        Intake/Output Summary (Last 24 hours) at 01/15/2024 1135 Last data filed at 01/15/2024 0500 Gross per 24 hour  Intake 400 ml  Output 2000 ml  Net -1600 ml   Filed Weights   01/14/24 1233 01/14/24 1616 01/15/24 0431  Weight: 59.3 kg 57.3 kg 59.8 kg    Examination:  General exam: Appears emotional, very upset  Central nervous system: Alert  Extremities: Symmetric in appearance     Data Reviewed: I have personally reviewed following labs and imaging studies  CBC: Recent Labs  Lab 01/11/24 0517 01/12/24 0439 01/13/24 0436 01/14/24 0341 01/15/24 0408  WBC 4.5 4.9 5.1 7.1 6.0  NEUTROABS 2.5 2.8 3.0 4.7 3.9  HGB 6.9* 9.1* 8.8* 8.9* 7.8*  HCT 21.4* 27.7* 27.1* 28.3* 24.9*  MCV 95.5 93.6 93.1 96.9 97.6  PLT 263 309 333 349 314   Basic Metabolic Panel: Recent Labs  Lab 01/09/24 0824 01/12/24 0439 01/13/24 0436 01/14/24 0341 01/15/24 0408  NA  --  131* 131* 126* 131*  K  --  4.3 4.3 5.1 4.6  CL  --  95* 93* 90* 92*  CO2  --  24 25 23 27   GLUCOSE  --  66* 85 83 94  BUN  --  30* 20 31* 18  CREATININE  --  4.40* 3.05* 3.69* 2.45*  CALCIUM   --  8.4* 8.3* 9.0 8.4*  MG 2.1 2.3 2.0 1.9 1.8  PHOS 3.6 4.0 3.1 4.2 3.9   GFR: Estimated Creatinine Clearance: 22.2 mL/min (A) (by C-G formula based on SCr of 2.45 mg/dL (H)). Liver Function Tests: Recent Labs  Lab 01/12/24 0439 01/13/24 0436 01/14/24 0341 01/15/24 0408  ALBUMIN  2.2* 2.3* 2.6* 2.3*   No results for input(s): LIPASE, AMYLASE in the last 168 hours. No results for input(s): AMMONIA in the last 168 hours. Coagulation  Profile: No results for input(s): INR, PROTIME in the last 168 hours. Cardiac Enzymes: No results for input(s): CKTOTAL, CKMB, CKMBINDEX, TROPONINI in the last 168 hours. BNP (last 3 results) No results for input(s): PROBNP in the last 8760 hours. HbA1C: No results for input(s): HGBA1C in the last 72 hours. CBG: No results for input(s): GLUCAP in the last 168 hours. Lipid Profile: No results for input(s): CHOL, HDL, LDLCALC, TRIG, CHOLHDL, LDLDIRECT in the last 72 hours. Thyroid Function Tests: No results for input(s): TSH, T4TOTAL, FREET4, T3FREE, THYROIDAB in the last 72 hours. Anemia Panel: No results for  input(s): VITAMINB12, FOLATE, FERRITIN, TIBC, IRON , RETICCTPCT in the last 72 hours. Sepsis Labs: No results for input(s): PROCALCITON, LATICACIDVEN in the last 168 hours.  No results found for this or any previous visit (from the past 240 hours).    Radiology Studies: No results found.    Scheduled Meds:  acetaminophen   1,000 mg Oral QID   amLODipine   10 mg Oral Daily   atorvastatin   10 mg Oral Daily   bisacodyl   10 mg Oral BID   budesonide -glycopyrrolate -formoterol   2 puff Inhalation BID   calcitRIOL   0.25 mcg Oral Daily   carvedilol   6.25 mg Oral BID WC   Chlorhexidine  Gluconate Cloth  6 each Topical Q0600   docusate sodium   100 mg Oral BID   ferrous sulfate   325 mg Oral Q breakfast   gabapentin   100 mg Oral TID   guaiFENesin   600 mg Oral BID   heparin   5,000 Units Subcutaneous Q8H   pantoprazole   40 mg Oral BID   polyethylene glycol  17 g Oral BID   sodium bicarbonate   1,300 mg Oral BID   sodium chloride  flush  3 mL Intravenous Q12H   traZODone   50 mg Oral QHS   Continuous Infusions:   LOS: 16 days   Time spent: 40 minutes   Delon Hoe, DO [facility] 01/15/2024, 11:35 AM   Available via Epic secure chat 7am-7pm After these hours, please refer to coverage provider listed on  [email or web address]

## 2024-01-15 NOTE — Plan of Care (Signed)

## 2024-01-15 NOTE — Progress Notes (Signed)
  [facility] Progress Note   Subjective:    Seen and examined patient at bedside. Tolerated yesterday's HD with net UF 2L. She expresses frustration and sadness on overall health condition. We consulted the chaplain for ongoing support. Next HD 10/31.    Objective Vitals:   01/15/24 0417 01/15/24 0431 01/15/24 0747 01/15/24 1124  BP: 138/81  (!) 145/79 (!) 151/76  Pulse: 82  82 79  Resp: 17  18 18   Temp: 98 F (36.7 C)  98.1 F (36.7 C) (!) 97.2 F (36.2 C)  TempSrc: Oral     SpO2: 95% 95% 94% 100%  Weight:  59.8 kg    Height:       Physical Exam General: Alert female in NAD Heart: RRR, no murmurs, rubs or gallops Lungs: CTA bilaterally, respirations unlabored Abdomen: Soft, + distention  Extremities: No edema b/l lower extremities Dialysis Access:  RUE AVF +t/b  Filed Weights   01/14/24 1233 01/14/24 1616 01/15/24 0431  Weight: 59.3 kg 57.3 kg 59.8 kg    Intake/Output Summary (Last 24 hours) at 01/15/2024 1236 Last data filed at 01/15/2024 0500 Gross per 24 hour  Intake 400 ml  Output 2000 ml  Net -1600 ml    Additional Objective Labs: Basic Metabolic Panel: Recent Labs  Lab 01/13/24 0436 01/14/24 0341 01/15/24 0408  NA 131* 126* 131*  K 4.3 5.1 4.6  CL 93* 90* 92*  CO2 25 23 27   GLUCOSE 85 83 94  BUN 20 31* 18  CREATININE 3.05* 3.69* 2.45*  CALCIUM  8.3* 9.0 8.4*  PHOS 3.1 4.2 3.9   Liver Function Tests: Recent Labs  Lab 01/13/24 0436 01/14/24 0341 01/15/24 0408  ALBUMIN  2.3* 2.6* 2.3*   No results for input(s): LIPASE, AMYLASE in the last 168 hours. CBC: Recent Labs  Lab 01/11/24 0517 01/12/24 0439 01/13/24 0436 01/14/24 0341 01/15/24 0408  WBC 4.5 4.9 5.1 7.1 6.0  NEUTROABS 2.5 2.8 3.0 4.7 3.9  HGB 6.9* 9.1* 8.8* 8.9* 7.8*  HCT 21.4* 27.7* 27.1* 28.3* 24.9*  MCV 95.5 93.6 93.1 96.9 97.6  PLT 263 309 333 349 314   Blood Culture    Component Value Date/Time   SDES PERITONEAL 01/02/2024 1152   SDES PERITONEAL  01/02/2024 1152   SPECREQUEST NONE 01/02/2024 1152   SPECREQUEST NONE 01/02/2024 1152   CULT  01/02/2024 1152    NO GROWTH 5 DAYS Performed at [facility], [address], [place], [place] [address]    REPTSTATUS 01/07/2024 FINAL 01/02/2024 1152   REPTSTATUS 01/02/2024 FINAL 01/02/2024 1152    Cardiac Enzymes: No results for input(s): CKTOTAL, CKMB, CKMBINDEX, TROPONINI in the last 168 hours. CBG: No results for input(s): GLUCAP in the last 168 hours. Iron  Studies: No results for input(s): IRON , TIBC, TRANSFERRIN, FERRITIN in the last 72 hours. Lab Results  Component Value Date   INR 1.1 12/30/2023   INR 1.1 05/16/2023   INR 1.0 05/16/2023   Studies/Results: No results found.  Medications:   acetaminophen   1,000 mg Oral QID   amLODipine   10 mg Oral Daily   atorvastatin   10 mg Oral Daily   bisacodyl   10 mg Oral BID   budesonide -glycopyrrolate -formoterol   2 puff Inhalation BID   calcitRIOL   0.25 mcg Oral Daily   carvedilol   6.25 mg Oral BID WC   Chlorhexidine  Gluconate Cloth  6 each Topical Q0600   docusate sodium   100 mg Oral BID   ferrous sulfate   325 mg Oral Q breakfast  gabapentin   100 mg Oral TID   guaiFENesin   600 mg Oral BID   heparin   5,000 Units Subcutaneous Q8H   pantoprazole   40 mg Oral BID   polyethylene glycol  17 g Oral BID   sodium bicarbonate   1,300 mg Oral BID   sodium chloride  flush  3 mL Intravenous Q12H   traZODone   50 mg Oral QHS    Assessment/Plan: 1.  Acute hypoxic respiratory failure: Appears to be in part from volume overload and decreased respiratory excursion secondary to severe ascites.  Improved status post hemodialysis/ultrafiltration and previous paracentesis. 2. ESRD: Continue MWF schedule.  She does not have an outpatient dialysis unit because of nonadherence with keeping scheduled appointments (we had arranged transportation to her hotel at one point but she still did not attend HD). Next HD here 10/31. 3.  Anemia: Hemoglobin and hematocrit improved status post PRBC transfusion, no overt blood loss noted.  Continue ESA. 4. CKD-MBD: Corrected calcium  and phosphorus levels both within acceptable range, monitor trend.  Tolerating current soft diet. 5.  Abdominal pain/distention: Without recurrent ascites noted on attempted repeat paracentesis. Continue current bowel regimen per PMD 6. Hypertension: Elevated blood pressures noted, will follow closely on current antihypertensive medications with ultrafiltration on hemodialysis.  Charmaine Piety, NP [facility] 01/15/2024,12:36 PM  LOS: 16 days

## 2024-01-16 ENCOUNTER — Other Ambulatory Visit (HOSPITAL_COMMUNITY): Payer: Self-pay

## 2024-01-16 LAB — CBC WITH DIFFERENTIAL/PLATELET
Abs Immature Granulocytes: 0.05 K/uL (ref 0.00–0.07)
Basophils Absolute: 0.1 K/uL (ref 0.0–0.1)
Basophils Relative: 1 %
Eosinophils Absolute: 0.3 K/uL (ref 0.0–0.5)
Eosinophils Relative: 5 %
HCT: 24.6 % — ABNORMAL LOW (ref 36.0–46.0)
Hemoglobin: 7.7 g/dL — ABNORMAL LOW (ref 12.0–15.0)
Immature Granulocytes: 1 %
Lymphocytes Relative: 19 %
Lymphs Abs: 1.1 K/uL (ref 0.7–4.0)
MCH: 30.3 pg (ref 26.0–34.0)
MCHC: 31.3 g/dL (ref 30.0–36.0)
MCV: 96.9 fL (ref 80.0–100.0)
Monocytes Absolute: 0.7 K/uL (ref 0.1–1.0)
Monocytes Relative: 12 %
Neutro Abs: 3.6 K/uL (ref 1.7–7.7)
Neutrophils Relative %: 62 %
Platelets: 364 K/uL (ref 150–400)
RBC: 2.54 MIL/uL — ABNORMAL LOW (ref 3.87–5.11)
RDW: 15.9 % — ABNORMAL HIGH (ref 11.5–15.5)
WBC: 5.9 K/uL (ref 4.0–10.5)
nRBC: 0 % (ref 0.0–0.2)

## 2024-01-16 LAB — RENAL FUNCTION PANEL
Albumin: 2.2 g/dL — ABNORMAL LOW (ref 3.5–5.0)
Anion gap: 13 (ref 5–15)
BUN: 28 mg/dL — ABNORMAL HIGH (ref 8–23)
CO2: 25 mmol/L (ref 22–32)
Calcium: 8.2 mg/dL — ABNORMAL LOW (ref 8.9–10.3)
Chloride: 90 mmol/L — ABNORMAL LOW (ref 98–111)
Creatinine, Ser: 3.35 mg/dL — ABNORMAL HIGH (ref 0.44–1.00)
GFR, Estimated: 15 mL/min — ABNORMAL LOW (ref >60)
Glucose, Bld: 108 mg/dL — ABNORMAL HIGH (ref 70–99)
Phosphorus: 5 mg/dL — ABNORMAL HIGH (ref 2.5–4.6)
Potassium: 5 mmol/L (ref 3.5–5.1)
Sodium: 128 mmol/L — ABNORMAL LOW (ref 135–145)

## 2024-01-16 LAB — MAGNESIUM: Magnesium: 1.8 mg/dL (ref 1.7–2.4)

## 2024-01-16 MED ORDER — TORSEMIDE 100 MG PO TABS
100.0000 mg | ORAL_TABLET | Freq: Every day | ORAL | 0 refills | Status: DC
  Filled 2024-01-16: qty 30, 30d supply, fill #0

## 2024-01-16 MED ORDER — ALPRAZOLAM 0.25 MG PO TABS
0.2500 mg | ORAL_TABLET | Freq: Every evening | ORAL | 0 refills | Status: DC | PRN
  Filled 2024-01-16: qty 14, 14d supply, fill #0

## 2024-01-16 MED ORDER — OXYCODONE HCL 5 MG PO TABS
ORAL_TABLET | ORAL | Status: AC
  Filled 2024-01-16: qty 2

## 2024-01-16 MED ORDER — OXYCODONE HCL 5 MG PO TABS
5.0000 mg | ORAL_TABLET | ORAL | 0 refills | Status: DC | PRN
  Filled 2024-01-16: qty 15, 3d supply, fill #0

## 2024-01-16 MED ORDER — POLYETHYLENE GLYCOL 3350 17 GM/SCOOP PO POWD
17.0000 g | Freq: Every day | ORAL | 0 refills | Status: DC | PRN
  Filled 2024-01-16: qty 238, 14d supply, fill #0

## 2024-01-16 NOTE — Progress Notes (Signed)
  [facility] Progress Note   Subjective:    Seen in pt's room No new c/o's, legs still swollen For HD today Might be dc'ing after HD completed    Objective Vitals:   01/15/24 2023 01/15/24 2026 01/16/24 0500 01/16/24 0827  BP: (!) 151/74   (!) 146/71  Pulse: 82 80  78  Resp: 18 16  20   Temp: 98 F (36.7 C)     TempSrc:      SpO2: 94% 96%  92%  Weight:   59.6 kg   Height:       Physical Exam General: Alert female in NAD Heart: RRR, no murmurs, rubs or gallops Lungs: CTA bilaterally, respirations unlabored Abdomen: Soft, + distention  Extremities: 1+ edema b/l lower extremities Dialysis Access:  RUE AVF +t/b   Assessment/Plan: 1.  Acute hypoxic respiratory failure: Appears to be in part from volume overload and decreased respiratory excursion secondary to severe ascites.  Improved status post hemodialysis/ultrafiltration and previous paracentesis. Now has accumulated some LE edema bilaterally again. Will plan 4 L UF w/ HD 2nd shift. Pt is supposed to be dc'd later today.  2. ESRD: Continue MWF schedule.  She does not have an outpatient dialysis unit because of nonadherence with keeping scheduled appointments (we had arranged transportation to her hotel at one point but she still did not attend HD). Next HD today.  3. Anemia: Hemoglobin and hematocrit improved status post PRBC transfusion, no overt blood loss noted.  Continue ESA. 4. CKD-MBD: Corrected calcium  and phosphorus levels both within acceptable range, monitor trend.  Tolerating current soft diet. 5.  Abdominal pain/distention: Without recurrent ascites noted on attempted repeat paracentesis. Continue current bowel regimen per PMD 6. Hypertension: Elevated blood pressures noted, will follow closely on current antihypertensive medications with ultrafiltration on hemodialysis.  Myer Fret  MD  [facility] 01/16/2024, 12:09 PM  Recent Labs  Lab 01/15/24 0408 01/16/24 0428  HGB 7.8* 7.7*  ALBUMIN  2.3* 2.2*   CALCIUM  8.4* 8.2*  PHOS 3.9 5.0*  CREATININE 2.45* 3.35*  K 4.6 5.0    Inpatient medications:  acetaminophen   1,000 mg Oral QID   amLODipine   10 mg Oral Daily   atorvastatin   10 mg Oral Daily   bisacodyl   10 mg Oral BID   budesonide -glycopyrrolate -formoterol   2 puff Inhalation BID   calcitRIOL   0.25 mcg Oral Daily   carvedilol   6.25 mg Oral BID WC   Chlorhexidine  Gluconate Cloth  6 each Topical Q0600   docusate sodium   100 mg Oral BID   ferrous sulfate   325 mg Oral Q breakfast   gabapentin   100 mg Oral TID   guaiFENesin   600 mg Oral BID   heparin   5,000 Units Subcutaneous Q8H   pantoprazole   40 mg Oral BID   polyethylene glycol  17 g Oral BID   sodium bicarbonate   1,300 mg Oral BID   sodium chloride  flush  3 mL Intravenous Q12H   traZODone   50 mg Oral QHS    ALPRAZolam , alum & mag hydroxide-simeth, bisacodyl , hydrALAZINE , ipratropium-albuterol , ondansetron  **OR** ondansetron  (ZOFRAN ) IV, oxyCODONE , sodium chloride  flush

## 2024-01-16 NOTE — Plan of Care (Signed)
 Palliative-   Chart reviewed, consult received.  Discussed with Chaplain- was advised per nursing not to bother patient if she is sleeping.  On evaluation patient sleeping soundly. Will make attempt to see her at a later time.   [name], AGNP-C Palliative Medicine  No charge

## 2024-01-16 NOTE — Progress Notes (Signed)
 This chaplain responded to Dr. Geralynn consult for spiritual care in the setting of anxiousness and pain with a new diagnosis and the Pt. request for presence and reflective listening.  This chaplain reviewed the chart notes and received an update from the Pt. RN-David. The Pt. is sleeping at the time of the visit. This chaplain will attempt a revisit.  Chaplain Leeroy Hummer [phone]

## 2024-01-16 NOTE — Plan of Care (Addendum)
 Palliative-   Attempt to see patient again x 2. She was off unit for HD.  Noted she has discharge orders in.  Recommend referral for outpatient Palliative, although this may not be possible due to her social circumstances.   Cassondra Stain, AGNP-C Palliative Medicine  No charge

## 2024-01-16 NOTE — Progress Notes (Addendum)
 Received patient in bed to unit.  Alert and oriented.  Informed consent signed and in chart.   TX duration:3.5 hours  Patient tolerated well.  Transported back to the room  Alert, without acute distress.  Hand-off given to patient's nurse.   Access used: Right Upper Arm fistula Access issues: BFR to 350 due to high arterial pressures  Total UF removed: 4L Medication(s) given: oxycodone , Xanex   01/16/24 1642  Vitals  Temp 97.7 F (36.5 C)  Temp Source Oral  BP (!) 151/80  MAP (mmHg) 99  Pulse Rate 80  ECG Heart Rate 80  Resp 20  Oxygen  Therapy  SpO2 95 %  O2 Device Room Air  During Treatment Monitoring  Duration of HD Treatment -hour(s) 3.5 hour(s)  HD Safety Checks Performed Yes  Intra-Hemodialysis Comments Tx completed  Post Treatment  Dialyzer Clearance Clear  Liters Processed 76.9  Fluid Removed (mL) 4000 mL  Tolerated HD Treatment Yes  Post-Hemodialysis Comments tolerated treatment well  AVG/AVF Arterial Site Held (minutes) 7 minutes  AVG/AVF Venous Site Held (minutes) 7 minutes  Fistula / Graft Right Upper arm Arteriovenous fistula  Placement Date/Time: 08/14/22 1230   Orientation: Right  Access Location: (c) Upper arm  Access Type: Arteriovenous fistula  Site Condition No complications  Fistula / Graft Assessment Present;Thrill;Bruit  Status Patent;Deaccessed  Drainage Description None     [name] LPN [facility]

## 2024-01-16 NOTE — Discharge Summary (Signed)
 Physician Discharge Summary  [name] FMW:[id number] DOB: [birth year] DOA: 12/30/2023  PCP: Freddrick, No  Admit date: 12/30/2023 Discharge date: 01/16/2024  Admitted From: Homeless Disposition: Homeless shelter   Recommendations for Outpatient Follow-up:  Follow-up admitted for dialysis needs.  Discharge Condition: Fair CODE STATUS: DNR/DNI Diet recommendation: Low-salt diet  Discharge summary: [name] is a 63 y.o. female with PMH significant for ESRD on hemodialysis, noncompliant with dialysis, homelessness, COPD, chronic diastolic CHF, chronic pain syndrome, history of polysubstance use including cocaine, depression, GERD, alcohol  use, chronic tobacco use presented in the ED with abdominal distention and shortness of breath. She was seen by Nephrology and underwent HD.  Initially found to have ileus that subsequently improved.  Now has adequate bowel function.  Patient comes back to ER for dialysis needs.  Usually she gets dialysis and gets discharged, however at this time was admitted because of abdominal pain and distention secondary to constipation and ileus that is improved.  She is going back to homeless shelter today.   Acute epigastric pain with distention/Ileus Severe constipation - CT scan showing ileus/constipation.   - NG tube placed on 10/22 that is removed.  Now has adequate bowel function.  Eating regular diet.  MiraLAX  as needed.   Cirrhosis with ascites - No known history of ascites in the past but noted abdominal distention on presentation.  S/p paracentesis 10/17 with 2.8 L removed.   - Repeat attempt 10/22, no evidence of ascites - Continue volume control with dialysis.  Patient on torsemide  100 mg daily.  This was prescribed. - Cirrhosis seen on CT. Hep C positive back in 2016. Need outpatient follow up.    ESRD on HD MWF  - Homeless, comes to ED for HD periodically.  Nephrology on board. Does not commit to outpatient HD center.    Anemia of chronic  kidney disease - Required 3 units PRBC so far during hospitalization - No obvious bleeding.  Etiology attributed to renal failure - Stable     Acute exacerbation HFpEF - Noncompliant with HD the outpatient setting - Improved with dialysis on admission.  Now on room air.   COPD - Complaining of some wheezing and shortness of breath - Continue DuoNebs, budesonide , Brovana    Severe peripheral arterial disease/peripheral neuropathy - Continue gabapentin , oxycodone .  Patient was prescribed limited supplies of Xanax  and oxycodone .   Possible urinary tract infection (POA) - UA noting LE, WBCs, bacteria.  Urine culture grew GBS.  Completed Rocephin  course   Polysubstance abuse - Noted positive tox screen for cocaine on presentation.  Encourage cessation.  High risk of readmission.  Today she is stable to go to homeless shelter or motel after dialysis.   Discharge Diagnoses:  Principal Problem:   Noncompliance with renal dialysis Active Problems:   Chronic diastolic CHF (congestive heart failure) (HCC)   ESRD on dialysis (HCC)   Anemia of chronic disease   Essential hypertension   COPD (chronic obstructive pulmonary disease) (HCC)   Anxiety and depression   Hyperlipidemia   Tobacco abuse   Polysubstance abuse (HCC)   Homelessness   IDA (iron  deficiency anemia)   Chronic pain syndrome    Discharge Instructions  Discharge Instructions     Diet - low sodium heart healthy   Complete by: As directed    Increase activity slowly   Complete by: As directed       Allergies as of 01/16/2024       Reactions   Zestril  [lisinopril ] Other (See Comments)  Hyperkalemia        Medication List     STOP taking these medications    sucralfate  1 GM/10ML suspension Commonly known as: CARAFATE        TAKE these medications    acetaminophen  325 MG tablet Commonly known as: TYLENOL  Take 2 tablets (650 mg total) by mouth every 6 (six) hours as needed for mild pain (pain  score 1-3).   albuterol  108 (90 Base) MCG/ACT inhaler Commonly known as: VENTOLIN  HFA Inhale 2 puffs into the lungs every 6 (six) hours as needed for wheezing or shortness of breath.   ALPRAZolam  0.25 MG tablet Commonly known as: XANAX  Take 1 tablet (0.25 mg total) by mouth at bedtime as needed for up to 14 days for anxiety.   amLODipine  10 MG tablet Commonly known as: NORVASC  Take 1 tablet (10 mg total) by mouth daily.   atorvastatin  10 MG tablet Commonly known as: LIPITOR Take 1 tablet (10 mg total) by mouth daily.   calcitRIOL  0.25 MCG capsule Commonly known as: ROCALTROL  Take 1 capsule (0.25 mcg total) by mouth daily.   carvedilol  6.25 MG tablet Commonly known as: COREG  Take 1 tablet (6.25 mg total) by mouth 2 (two) times daily with a meal.   ferrous sulfate  325 (65 FE) MG tablet Commonly known as: FeroSul Take 1 tablet (325 mg total) by mouth daily with breakfast.   gabapentin  100 MG capsule Commonly known as: NEURONTIN  Take 1 capsule (100 mg total) by mouth 2 (two) times daily for 60 doses.   nicotine  21 mg/24hr patch Commonly known as: NICODERM CQ  - dosed in mg/24 hours Place 1 patch (21 mg total) onto the skin daily.   oxyCODONE  5 MG immediate release tablet Commonly known as: Roxicodone  Take 1 tablet (5 mg total) by mouth every 4 (four) hours as needed for severe pain (pain score 7-10).   pantoprazole  40 MG tablet Commonly known as: PROTONIX  Take 1 tablet (40 mg total) by mouth 2 (two) times daily.   polyethylene glycol 17 g packet Commonly known as: MIRALAX  / GLYCOLAX  Take 17 g by mouth daily as needed.   sodium bicarbonate  650 MG tablet Take 2 tablets (1,300 mg total) by mouth 2 (two) times daily.   torsemide  100 MG tablet Commonly known as: DEMADEX  Take 1 tablet (100 mg total) by mouth daily.   traZODone  50 MG tablet Commonly known as: DESYREL  Take 50 mg by mouth at bedtime.   Trelegy Ellipta  100-62.5-25 MCG/ACT Aepb Generic drug:  Fluticasone -Umeclidin-Vilant Inhale 1 puff into the lungs daily.        Follow-up Information     Vicci Barnie NOVAK, MD Follow up.   Specialty: Internal Medicine Why: Please call the clinic and schedule a hospital follow up in the next 7- 10 days. Contact information: [address] [place] [address] [phone]                Allergies  Allergen Reactions   Zestril  [Lisinopril ] Other (See Comments)    Hyperkalemia    Consultations: Nephrology   Procedures/Studies: DG Abd 1 View Result Date: 01/07/2024 EXAM: 1 VIEW XRAY OF THE ABDOMEN 01/07/2024 09:15:00 PM COMPARISON: Comparison with same day x-ray and CT 01/06/2024. CLINICAL HISTORY: Constipation. FINDINGS: BOWEL: Persistent dilation of the small bowel. Large colonic stool burden. SOFT TISSUES: No opaque urinary calculi. BONES: No acute osseous abnormality. IMPRESSION: 1. Persistent dilation of the small bowel. 2. Large colonic stool burden. Electronically signed by: Norman Gatlin MD 01/07/2024 09:23 PM  EDT RP Workstation: [id number]   DG Abd 1 View Result Date: 01/07/2024 CLINICAL DATA:  Abdominal distension, enteric catheter placement EXAM: ABDOMEN - 1 VIEW COMPARISON:  01/06/2024 FINDINGS: Frontal view of the lower chest and upper abdomen demonstrates enteric catheter passing below diaphragm, tip and side port projecting over the gastric body. Continued distension of the large and small bowel, with large amount of retained stool throughout the colon again noted. Stable left pleural effusion. IMPRESSION: 1. Enteric catheter tip and side port projecting over the gastric body. 2. Stable distension of the large and small bowel compatible with ileus or distal obstruction. Electronically Signed   By: Ozell Daring M.D.   On: 01/07/2024 15:23   IR ABDOMEN US  LIMITED Result Date: 01/07/2024 INDICATION: History of ESRD on hemodialysis. Moderate but decreasing amount of ascites on CT yesterday. Consult for  paracentesis. EXAM: ULTRASOUND ABDOMEN LIMITED FINDINGS: Imaging of all 4 quadrants of the abdomen reveal no ascites IMPRESSION: No ascites seen in ultrasound.  No need for paracentesis. Performed by: Wyatt Pommier, PA-C Electronically Signed   By: CHRISTELLA.  Shick M.D.   On: 01/07/2024 14:25   CT ABDOMEN PELVIS WO CONTRAST Result Date: 01/06/2024 EXAM: CT ABDOMEN AND PELVIS WITHOUT CONTRAST 01/06/2024 04:24:55 PM TECHNIQUE: CT of the abdomen and pelvis was performed without the administration of intravenous contrast. Multiplanar reformatted images are provided for review. Automated exposure control, iterative reconstruction, and/or weight-based adjustment of the mA/kV was utilized to reduce the radiation dose to as low as reasonably achievable. COMPARISON: Comparison with 12/30/2023. CLINICAL HISTORY: Abdominal pain, acute, nonlocalized. FINDINGS: LOWER CHEST: Small left greater than right pleural effusions. Associated basilar atelectasis or pneumonia. LIVER: Nodular Hepatic contour compatible with cirrhosis. GALLBLADDER AND BILE DUCTS: Gallbladder is unremarkable. No biliary ductal dilatation. SPLEEN: No acute abnormality. PANCREAS: No acute abnormality. ADRENAL GLANDS: No acute abnormality. 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 No stones in the kidneys or ureters. No hydronephrosis. No perinephric or periureteral stranding. Urinary bladder is unremarkable. GI AND BOWEL: Distended stomach. Diffuse dilation of the small bowel. No abrupt transition. Gradual dilution of enteric contrast in the small bowel. Fecalization of the distal ileum. Findings are compatible with slow transit / ileus. Moderate to large colonic stool burden. Normal appendix. PERITONEUM AND RETROPERITONEUM: Moderate abdominal pelvic ascites decreased from 12/30/2023. No free air. VASCULATURE: Aorta is normal in caliber. Aortic atherosclerotic calcification. LYMPH NODES: No lymphadenopathy. REPRODUCTIVE ORGANS: No acute  abnormality. 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 No acute osseous abnormality. Body wall edema. IMPRESSION: 1. Findings compatible with slow transit/ileus  and constipation. 2. Moderate abdominopelvic ascites, decreased from 12/30/23. 3. Small left greater than right pleural effusions with associated basilar atelectasis or pneumonia. 4. Cirrhosis. Electronically signed by: Norman Gatlin MD 01/06/2024 08:26 PM EDT RP Workstation: [id number]   IR Paracentesis Result Date: 01/02/2024 INDICATION: 63 year old female with history of HFpEF/ESRD with volume overload, admitted for acute respiratory failure, and diagnosed with new onset ascites. IR was requested for diagnostic and therapeutic paracentesis. EXAM: ULTRASOUND GUIDED DIAGNOSTIC AND THERAPEUTIC PARACENTESIS MEDICATIONS: 5 cc of 1% lidocaine  with epi. COMPLICATIONS: None immediate. PROCEDURE: Informed written consent was obtained from the patient after a discussion of the risks, benefits and alternatives to treatment. A timeout was performed prior to the initiation of the procedure. Initial ultrasound scanning demonstrates a large amount of ascites within the right lower abdominal quadrant. The right lower abdomen was prepped and draped in the usual sterile fashion. 1% lidocaine  was used  for local anesthesia. Following this, a 19 gauge, 7-cm, Yueh catheter was introduced. An ultrasound image was saved for documentation purposes. The paracentesis was performed. The catheter was removed and a dressing was applied. The patient tolerated the procedure well without immediate post procedural complication. FINDINGS: A total of approximately 2.8 L of clear, straw-colored peritoneal fluid was removed. Samples were sent to the laboratory as requested by the clinical team. IMPRESSION: Successful ultrasound-guided paracentesis yielding 2.8 liters of peritoneal fluid. Procedure performed by Carlin Griffon, PA-C Electronically Signed   By:  Cordella Banner   On: 01/02/2024 16:56   CT Angio Chest/Abd/Pel for Dissection W and/or Wo Contrast Result Date: 12/30/2023 EXAM: CT CHEST, ABDOMEN AND PELVIS WITH AND WITHOUT CONTRAST 12/30/2023 03:51:10 PM TECHNIQUE: CT of the chest, abdomen and pelvis was performed with and without the administration of 100 mL of iohexol  (OMNIPAQUE ) 350 MG/ML injection. Multiplanar reformatted images are provided for review. Automated exposure control, iterative reconstruction, and/or weight based adjustment of the mA/kV was utilized to reduce the radiation dose to as low as reasonably achievable. COMPARISON: 10/23/2023 CLINICAL HISTORY: Aortic aneurysm suspected. Shortness of Breath; CT Angio Chest/Abd/Pel for Dissection W and/or Wo Contrast; Aortic aneurysm suspected. Omni 350 ; Pt bib [facility] coming [facility]. Pt has missed dialysis for about a month and states she has been unable to go due to not having the resources. Pt abdomen distended and pt c/o shortness of breath and lower back pain. FINDINGS: CHEST: MEDIASTINUM AND LYMPH NODES: Mild cardiomegaly. Coronary artery calcifications are noted. The central airways are clear. No mediastinal, hilar or axillary lymphadenopathy. LUNGS AND PLEURA: No focal consolidation or pulmonary edema. Small left pleural effusion is noted. Minimal right pleural effusion. Minimal bibasilar subsegmental atelectasis. Minimal emphysematous disease. No pneumothorax. ABDOMEN AND PELVIS: LIVER: Probable hepatic cirrhosis. GALLBLADDER AND BILE DUCTS: Gallbladder is unremarkable. No biliary ductal dilatation. SPLEEN: No acute abnormality. PANCREAS: No acute abnormality. ADRENAL GLANDS: No acute abnormality. KIDNEYS, URETERS AND BLADDER: Severe stenosis involving the proximal left renal artery. The right renal artery is not well opacified. No stones in the kidneys or ureters. No hydronephrosis. No perinephric or periureteral stranding. Urinary bladder is unremarkable. GI AND  BOWEL: Stomach demonstrates no acute abnormality. Severe narrowing at the origin of the inferior mesenteric artery. There is no bowel obstruction. REPRODUCTIVE ORGANS: No acute abnormality. PERITONEUM AND RETROPERITONEUM: Moderate ascites. No free air. VASCULATURE: Aortic atherosclerosis. Occlusion of proximal portions of both superficial femoral arteries. Moderate stenosis of the origin of the right common iliac artery secondary to calcified plaque. Severe stenosis involving the proximal left renal artery. The right renal artery is not well opacified. Severe narrowing at the origin of the inferior mesenteric artery. ABDOMINAL AND PELVIS LYMPH NODES: No lymphadenopathy. REPRODUCTIVE ORGANS: No acute abnormality. BONES AND SOFT TISSUES: No acute osseous abnormality. No focal soft tissue abnormality. IMPRESSION: 1. No evidence of aortic dissection or aneurysm. 2. Severe stenosis of the proximal left renal artery. Right renal artery not well opacified. 3. Severe narrowing at the origin of the inferior mesenteric artery. 4. Moderate stenosis at the origin of the right common iliac artery due to calcified plaque. 5. Occlusion of the proximal portions of both superficial femoral arteries. 6. Probable hepatic cirrhosis with moderate ascites. Electronically signed by: Lynwood Seip MD 12/30/2023 04:09 PM EDT RP Workstation: [id number]   DG Chest Port 1 View Result Date: 12/30/2023 CLINICAL DATA:  Questionable sepsis - evaluate for abnormality EXAM: PORTABLE CHEST - 1 VIEW COMPARISON:  11/24/2023 FINDINGS:  Biapical pleural thickening. Small right pleural effusion with right basilar atelectasis. No new airspace consolidation or pneumothorax. Mild cardiomegaly. Tortuous aorta with aortic atherosclerosis. No acute fracture or destructive lesions. Multilevel thoracic osteophytosis. IMPRESSION: Mild cardiomegaly with small right pleural effusion. Electronically Signed   By: Rogelia Myers M.D.   On: 12/30/2023 14:28   (Echo,  Carotid, EGD, Colonoscopy, ERCP)    Subjective: Patient seen and examined.  She tells me that she needs some medication refill especially Xanax  and oxycodone .  She tells me she has received her check today and she can go to a motel after dialysis.  Patient thinks she had loose bowel movements so we will use MiraLAX  only as needed.   Discharge Exam: Vitals:   01/15/24 2026 01/16/24 0827  BP:  (!) 146/71  Pulse: 80 78  Resp: 16 20  Temp:    SpO2: 96% 92%   Vitals:   01/15/24 2023 01/15/24 2026 01/16/24 0500 01/16/24 0827  BP: (!) 151/74   (!) 146/71  Pulse: 82 80  78  Resp: 18 16  20   Temp: 98 F (36.7 C)     TempSrc:      SpO2: 94% 96%  92%  Weight:   59.6 kg   Height:        General: Pt is alert, awake, not in acute distress Chronically sick looking.  Not in distress. Cardiovascular: RRR, S1/S2 +, no rubs, no gallops Respiratory: Clear. Abdominal: Soft, NT, ND, bowel sounds + Extremities: no  pedal edema. Right upper extremity AV fistula present.   The results of significant diagnostics from this hospitalization (including imaging, microbiology, ancillary and laboratory) are listed below for reference.     Microbiology: No results found for this or any previous visit (from the past 240 hours).   Labs: BNP (last 3 results) Recent Labs    10/05/23 0100 10/23/23 0859 12/30/23 1225  BNP 882.0* 794.8* 1,598.1*   Basic Metabolic Panel: Recent Labs  Lab 01/12/24 0439 01/13/24 0436 01/14/24 0341 01/15/24 0408 01/16/24 0428  NA 131* 131* 126* 131* 128*  K 4.3 4.3 5.1 4.6 5.0  CL 95* 93* 90* 92* 90*  CO2 24 25 23 27 25   GLUCOSE 66* 85 83 94 108*  BUN 30* 20 31* 18 28*  CREATININE 4.40* 3.05* 3.69* 2.45* 3.35*  CALCIUM  8.4* 8.3* 9.0 8.4* 8.2*  MG 2.3 2.0 1.9 1.8 1.8  PHOS 4.0 3.1 4.2 3.9 5.0*   Liver Function Tests: Recent Labs  Lab 01/12/24 0439 01/13/24 0436 01/14/24 0341 01/15/24 0408 01/16/24 0428  ALBUMIN  2.2* 2.3* 2.6* 2.3* 2.2*   No results  for input(s): LIPASE, AMYLASE in the last 168 hours. No results for input(s): AMMONIA in the last 168 hours. CBC: Recent Labs  Lab 01/12/24 0439 01/13/24 0436 01/14/24 0341 01/15/24 0408 01/16/24 0428  WBC 4.9 5.1 7.1 6.0 5.9  NEUTROABS 2.8 3.0 4.7 3.9 3.6  HGB 9.1* 8.8* 8.9* 7.8* 7.7*  HCT 27.7* 27.1* 28.3* 24.9* 24.6*  MCV 93.6 93.1 96.9 97.6 96.9  PLT 309 333 349 314 364   Cardiac Enzymes: No results for input(s): CKTOTAL, CKMB, CKMBINDEX, TROPONINI in the last 168 hours. BNP: Invalid input(s): POCBNP CBG: No results for input(s): GLUCAP in the last 168 hours. D-Dimer No results for input(s): DDIMER in the last 72 hours. Hgb A1c No results for input(s): HGBA1C in the last 72 hours. Lipid Profile No results for input(s): CHOL, HDL, LDLCALC, TRIG, CHOLHDL, LDLDIRECT in the last 72 hours. Thyroid function studies  No results for input(s): TSH, T4TOTAL, T3FREE, THYROIDAB in the last 72 hours.  Invalid input(s): FREET3 Anemia work up No results for input(s): VITAMINB12, FOLATE, FERRITIN, TIBC, IRON , RETICCTPCT in the last 72 hours. Urinalysis    Component Value Date/Time   COLORURINE YELLOW 12/30/2023 1305   APPEARANCEUR CLOUDY (A) 12/30/2023 1305   LABSPEC 1.011 12/30/2023 1305   PHURINE 6.0 12/30/2023 1305   GLUCOSEU 50 (A) 12/30/2023 1305   HGBUR SMALL (A) 12/30/2023 1305   BILIRUBINUR NEGATIVE 12/30/2023 1305   KETONESUR NEGATIVE 12/30/2023 1305   PROTEINUR 100 (A) 12/30/2023 1305   UROBILINOGEN 0.2 03/02/2010 2027   NITRITE NEGATIVE 12/30/2023 1305   LEUKOCYTESUR LARGE (A) 12/30/2023 1305   Sepsis Labs Recent Labs  Lab 01/13/24 0436 01/14/24 0341 01/15/24 0408 01/16/24 0428  WBC 5.1 7.1 6.0 5.9   Microbiology No results found for this or any previous visit (from the past 240 hours).   Time coordinating discharge:  35 minutes  SIGNED:   Renato Applebaum, MD  [facility] 01/16/2024,  11:43 AM

## 2024-01-16 NOTE — Plan of Care (Signed)

## 2024-01-16 NOTE — TOC Progression Note (Signed)
 Transition of Care [facility]) - Progression Note    Patient Details  Name: [name] MRN: [id number] Date of Birth: [birth year]  Transition of Care [facility]) CM/SW Contact  Sherline Clack, [place] Phone Number: 01/16/2024, 3:35 PM  Clinical Narrative:     CSW presented patient with a list of housing resources, as well as how to contact the case workers at the [facility] and [employer]. CSW let patient know [facility] case workers are available Tuesday to Friday until three. Patient endorses she will go to the [facility] on Tuesday and requested bus passes. CSW will provide patient with bus passes once she is ready for discharge. CSW reviewed [employer] information with patient, as well as the food stamps application per patient's request. CSW left a copy of the application at bedside.   Patient pays to stay at motels during the month and uses her widow pension check, which is $1350 monthly. Her next check will be sent to her on Monday. Patient will reach out to social security to receive her check through mail instead of through direct deposit due to having accumulated $1300 in [place] fees and other charges. Patient is unsure if her card information was stolen (she has her card with her) and filed a claim with her bank. CSW provided emotional support to patient during her call with the bank.   Expected Discharge Plan: Homeless Shelter Barriers to Discharge: Inadequate or no insurance, Homeless with medical needs               Expected Discharge Plan and Services       Living arrangements for the past 2 months: Homeless Expected Discharge Date: 01/16/24                                     Social Drivers of Health (SDOH) Interventions SDOH Screenings   Food Insecurity: Food Insecurity Present (10/23/2023)  Housing: High Risk (10/23/2023)  Transportation Needs: Unmet Transportation Needs (10/23/2023)  Utilities: Patient Unable To Answer (10/23/2023)  Recent Concern:  Utilities - At Risk (07/25/2023)  Alcohol  Screen: Low Risk  (02/10/2023)  Depression (PHQ2-9): Medium Risk (06/11/2023)  Financial Resource Strain: High Risk (02/10/2023)  Physical Activity: Inactive (02/10/2023)  Social Connections: Socially Isolated (06/12/2023)  Tobacco Use: High Risk (12/30/2023)  Health Literacy: Adequate Health Literacy (02/10/2023)    Readmission Risk Interventions    01/14/2024    9:38 AM 07/01/2023   12:39 PM 06/16/2023    4:04 PM  Readmission Risk Prevention Plan  Transportation Screening Complete Complete Complete  Medication Review (RN Care Manager) Referral to Pharmacy Referral to Pharmacy Complete  PCP or Specialist appointment within 3-5 days of discharge Complete Complete Complete  HRI or Home Care Consult Complete Complete Complete  SW Recovery Care/Counseling Consult Complete Complete Complete  Palliative Care Screening Not Applicable Not Applicable Not Applicable  Skilled Nursing Facility Not Applicable Not Applicable Not Applicable

## 2024-01-16 NOTE — Progress Notes (Signed)
 Patient ordered for discharge this evening. I see last note discussing a possible location, however the note was posted after 3:30pm stating that the facility was only open Tuesday through Friday closing at 3pm. Patient has no place for safe discharge this evening and is also not expected to remain in the hospital until Tuesday to confirm this location.  During a [place] discussion with the patient, it sounds like she has 3-4 options for review for possible discharge in the morning. 2 potential family members or friends, and 1-2 possible shelters she would need to call and have her conditions/hospitalization pre-screened per prior experience. Advised her hospital discharge paperwork should certainly be proof enough of her hospitalization.  Patient would need assistance with bus fare for transportation which was also not arranged prior to late afternoon (1pm pickup) dialysis. I felt this drastically limited her potential for safe discharge and support the patient to stay overnight as I discussed with the on-call physician, Terry Hurst.  I believe with my discussion that the patient may find safe transportation by 12pm 1/1 and will be able to discharge early in the day and needs a few bus passes as also noted previously. Medications and refills need review per patient. I have not done so yet prior to discussing immediate discharge versus allowing her the morning to make arrangements.  I will attempt to review the medication needs as the patient wakes.

## 2024-01-17 NOTE — Progress Notes (Signed)
 Pt d/c to [facility], pt given resources for transportation and housing by social work. Pt refusing to leave, Pt escorted  to d/c lounge and encouraged to use resources to find a place to stay until she is a ccepted by housing. No other needs voiced at this time. [name] Louder 01/17/24

## 2024-01-17 NOTE — Plan of Care (Signed)
  Problem: Education: Goal: Knowledge of General Education information will improve Description: Including pain rating scale, medication(s)/side effects and non-pharmacologic comfort measures Outcome: Progressing   Problem: Health Behavior/Discharge Planning: Goal: Ability to manage health-related needs will improve Outcome: Progressing   Problem: Clinical Measurements: Goal: Ability to maintain clinical measurements within normal limits will improve Outcome: Progressing Goal: Will remain free from infection Outcome: Progressing Goal: Diagnostic test results will improve Outcome: Progressing Goal: Respiratory complications will improve Outcome: Progressing Goal: Cardiovascular complication will be avoided Outcome: Progressing   Problem: Activity: Goal: Risk for activity intolerance will decrease Outcome: Progressing   Problem: Elimination: Goal: Will not experience complications related to bowel motility Outcome: Progressing Goal: Will not experience complications related to urinary retention Outcome: Progressing   Problem: Pain Managment: Goal: General experience of comfort will improve and/or be controlled Outcome: Progressing   Problem: Skin Integrity: Goal: Risk for impaired skin integrity will decrease Outcome: Progressing   Problem: Education: Goal: Knowledge of General Education information will improve Description: Including pain rating scale, medication(s)/side effects and non-pharmacologic comfort measures Outcome: Progressing

## 2024-01-19 ENCOUNTER — Encounter: Payer: Self-pay | Admitting: Radiology

## 2024-01-25 ENCOUNTER — Emergency Department (HOSPITAL_COMMUNITY): Payer: Self-pay

## 2024-01-25 ENCOUNTER — Inpatient Hospital Stay (HOSPITAL_COMMUNITY)
Admission: EM | Admit: 2024-01-25 | Discharge: 2024-01-30 | DRG: 291 | Disposition: A | Discharge status: Home / Self Care | Payer: Self-pay | Attending: Internal Medicine | Admitting: Internal Medicine

## 2024-01-25 ENCOUNTER — Encounter (HOSPITAL_COMMUNITY): Payer: Self-pay

## 2024-01-25 DIAGNOSIS — R188 Other ascites: Secondary | ICD-10-CM | POA: Diagnosis present

## 2024-01-25 DIAGNOSIS — D631 Anemia in chronic kidney disease: Secondary | ICD-10-CM | POA: Diagnosis present

## 2024-01-25 DIAGNOSIS — I132 Hypertensive heart and chronic kidney disease with heart failure and with stage 5 chronic kidney disease, or end stage renal disease: Secondary | ICD-10-CM | POA: Diagnosis present

## 2024-01-25 DIAGNOSIS — R5383 Other fatigue: Secondary | ICD-10-CM | POA: Diagnosis present

## 2024-01-25 DIAGNOSIS — J449 Chronic obstructive pulmonary disease, unspecified: Secondary | ICD-10-CM | POA: Diagnosis present

## 2024-01-25 DIAGNOSIS — F17210 Nicotine dependence, cigarettes, uncomplicated: Secondary | ICD-10-CM | POA: Diagnosis present

## 2024-01-25 DIAGNOSIS — E1140 Type 2 diabetes mellitus with diabetic neuropathy, unspecified: Secondary | ICD-10-CM | POA: Diagnosis present

## 2024-01-25 DIAGNOSIS — Z833 Family history of diabetes mellitus: Secondary | ICD-10-CM

## 2024-01-25 DIAGNOSIS — J9601 Acute respiratory failure with hypoxia: Secondary | ICD-10-CM | POA: Diagnosis present

## 2024-01-25 DIAGNOSIS — Z5900 Homelessness unspecified: Secondary | ICD-10-CM | POA: Diagnosis not present

## 2024-01-25 DIAGNOSIS — G894 Chronic pain syndrome: Secondary | ICD-10-CM | POA: Diagnosis present

## 2024-01-25 DIAGNOSIS — F1210 Cannabis abuse, uncomplicated: Secondary | ICD-10-CM | POA: Diagnosis present

## 2024-01-25 DIAGNOSIS — K219 Gastro-esophageal reflux disease without esophagitis: Secondary | ICD-10-CM | POA: Diagnosis present

## 2024-01-25 DIAGNOSIS — F1910 Other psychoactive substance abuse, uncomplicated: Secondary | ICD-10-CM | POA: Diagnosis present

## 2024-01-25 DIAGNOSIS — Z91158 Patient's noncompliance with renal dialysis for other reason: Secondary | ICD-10-CM

## 2024-01-25 DIAGNOSIS — J9621 Acute and chronic respiratory failure with hypoxia: Secondary | ICD-10-CM | POA: Diagnosis present

## 2024-01-25 DIAGNOSIS — Z5982 Transportation insecurity: Secondary | ICD-10-CM

## 2024-01-25 DIAGNOSIS — Z8616 Personal history of COVID-19: Secondary | ICD-10-CM | POA: Diagnosis not present

## 2024-01-25 DIAGNOSIS — E8770 Fluid overload, unspecified: Secondary | ICD-10-CM | POA: Diagnosis present

## 2024-01-25 DIAGNOSIS — Z860101 Personal history of adenomatous and serrated colon polyps: Secondary | ICD-10-CM

## 2024-01-25 DIAGNOSIS — J4489 Other specified chronic obstructive pulmonary disease: Secondary | ICD-10-CM | POA: Diagnosis present

## 2024-01-25 DIAGNOSIS — Z79899 Other long term (current) drug therapy: Secondary | ICD-10-CM

## 2024-01-25 DIAGNOSIS — I5033 Acute on chronic diastolic (congestive) heart failure: Secondary | ICD-10-CM | POA: Diagnosis present

## 2024-01-25 DIAGNOSIS — F1410 Cocaine abuse, uncomplicated: Secondary | ICD-10-CM | POA: Diagnosis present

## 2024-01-25 DIAGNOSIS — E875 Hyperkalemia: Secondary | ICD-10-CM | POA: Diagnosis present

## 2024-01-25 DIAGNOSIS — F1011 Alcohol abuse, in remission: Secondary | ICD-10-CM | POA: Diagnosis present

## 2024-01-25 DIAGNOSIS — Z992 Dependence on renal dialysis: Secondary | ICD-10-CM

## 2024-01-25 DIAGNOSIS — F32A Depression, unspecified: Secondary | ICD-10-CM | POA: Diagnosis present

## 2024-01-25 DIAGNOSIS — G8929 Other chronic pain: Secondary | ICD-10-CM

## 2024-01-25 DIAGNOSIS — E785 Hyperlipidemia, unspecified: Secondary | ICD-10-CM | POA: Diagnosis present

## 2024-01-25 DIAGNOSIS — E1151 Type 2 diabetes mellitus with diabetic peripheral angiopathy without gangrene: Secondary | ICD-10-CM | POA: Diagnosis present

## 2024-01-25 DIAGNOSIS — E1122 Type 2 diabetes mellitus with diabetic chronic kidney disease: Secondary | ICD-10-CM | POA: Diagnosis present

## 2024-01-25 DIAGNOSIS — N186 End stage renal disease: Secondary | ICD-10-CM | POA: Diagnosis present

## 2024-01-25 DIAGNOSIS — I10 Essential (primary) hypertension: Secondary | ICD-10-CM | POA: Diagnosis present

## 2024-01-25 DIAGNOSIS — E871 Hypo-osmolality and hyponatremia: Secondary | ICD-10-CM | POA: Diagnosis present

## 2024-01-25 DIAGNOSIS — L8990 Pressure ulcer of unspecified site, unspecified stage: Secondary | ICD-10-CM | POA: Diagnosis present

## 2024-01-25 DIAGNOSIS — J810 Acute pulmonary edema: Principal | ICD-10-CM

## 2024-01-25 DIAGNOSIS — L89519 Pressure ulcer of right ankle, unspecified stage: Secondary | ICD-10-CM | POA: Diagnosis present

## 2024-01-25 DIAGNOSIS — Z66 Do not resuscitate: Secondary | ICD-10-CM | POA: Diagnosis present

## 2024-01-25 DIAGNOSIS — F419 Anxiety disorder, unspecified: Secondary | ICD-10-CM | POA: Diagnosis present

## 2024-01-25 DIAGNOSIS — Z888 Allergy status to other drugs, medicaments and biological substances status: Secondary | ICD-10-CM

## 2024-01-25 DIAGNOSIS — Z765 Malingerer [conscious simulation]: Secondary | ICD-10-CM

## 2024-01-25 LAB — I-STAT CHEM 8, ED
BUN: 79 mg/dL — ABNORMAL HIGH (ref 8–23)
Calcium, Ion: 0.75 mmol/L — CL (ref 1.15–1.40)
Chloride: 94 mmol/L — ABNORMAL LOW (ref 98–111)
Creatinine, Ser: 6.9 mg/dL — ABNORMAL HIGH (ref 0.44–1.00)
Glucose, Bld: 105 mg/dL — ABNORMAL HIGH (ref 70–99)
HCT: 26 % — ABNORMAL LOW (ref 36.0–46.0)
Hemoglobin: 8.8 g/dL — ABNORMAL LOW (ref 12.0–15.0)
Potassium: 6.6 mmol/L (ref 3.5–5.1)
Sodium: 122 mmol/L — ABNORMAL LOW (ref 135–145)
TCO2: 20 mmol/L — ABNORMAL LOW (ref 22–32)

## 2024-01-25 LAB — CBC WITH DIFFERENTIAL/PLATELET
Abs Immature Granulocytes: 0.08 K/uL — ABNORMAL HIGH (ref 0.00–0.07)
Basophils Absolute: 0.1 K/uL (ref 0.0–0.1)
Basophils Relative: 1 %
Eosinophils Absolute: 0.2 K/uL (ref 0.0–0.5)
Eosinophils Relative: 2 %
HCT: 26.4 % — ABNORMAL LOW (ref 36.0–46.0)
Hemoglobin: 8.2 g/dL — ABNORMAL LOW (ref 12.0–15.0)
Immature Granulocytes: 1 %
Lymphocytes Relative: 11 %
Lymphs Abs: 0.9 K/uL (ref 0.7–4.0)
MCH: 30.9 pg (ref 26.0–34.0)
MCHC: 31.1 g/dL (ref 30.0–36.0)
MCV: 99.6 fL (ref 80.0–100.0)
Monocytes Absolute: 0.6 K/uL (ref 0.1–1.0)
Monocytes Relative: 8 %
Neutro Abs: 6.1 K/uL (ref 1.7–7.7)
Neutrophils Relative %: 77 %
Platelets: 472 K/uL — ABNORMAL HIGH (ref 150–400)
RBC: 2.65 MIL/uL — ABNORMAL LOW (ref 3.87–5.11)
RDW: 17.3 % — ABNORMAL HIGH (ref 11.5–15.5)
WBC: 7.9 K/uL (ref 4.0–10.5)
nRBC: 0.3 % — ABNORMAL HIGH (ref 0.0–0.2)

## 2024-01-25 LAB — BASIC METABOLIC PANEL WITH GFR
Anion gap: 19 — ABNORMAL HIGH (ref 5–15)
BUN: 71 mg/dL — ABNORMAL HIGH (ref 8–23)
CO2: 17 mmol/L — ABNORMAL LOW (ref 22–32)
Calcium: 7.1 mg/dL — ABNORMAL LOW (ref 8.9–10.3)
Chloride: 89 mmol/L — ABNORMAL LOW (ref 98–111)
Creatinine, Ser: 6.18 mg/dL — ABNORMAL HIGH (ref 0.44–1.00)
GFR, Estimated: 7 mL/min — ABNORMAL LOW (ref >60)
Glucose, Bld: 100 mg/dL — ABNORMAL HIGH (ref 70–99)
Potassium: 7 mmol/L (ref 3.5–5.1)
Sodium: 125 mmol/L — ABNORMAL LOW (ref 135–145)

## 2024-01-25 LAB — TROPONIN I (HIGH SENSITIVITY): Troponin I (High Sensitivity): 11 ng/L (ref <18)

## 2024-01-25 LAB — CBG MONITORING, ED: Glucose-Capillary: 88 mg/dL (ref 70–99)

## 2024-01-25 LAB — BRAIN NATRIURETIC PEPTIDE: B Natriuretic Peptide: 1059.9 pg/mL — ABNORMAL HIGH (ref 0.0–100.0)

## 2024-01-25 MED ORDER — CARVEDILOL 6.25 MG PO TABS
6.2500 mg | ORAL_TABLET | Freq: Two times a day (BID) | ORAL | Status: DC
  Administered 2024-01-26 – 2024-01-30 (×7): 6.25 mg via ORAL
  Filled 2024-01-25 (×8): qty 1

## 2024-01-25 MED ORDER — INSULIN ASPART 100 UNIT/ML IJ SOLN
5.0000 [IU] | Freq: Once | INTRAMUSCULAR | Status: AC
  Administered 2024-01-25: 5 [IU] via SUBCUTANEOUS
  Filled 2024-01-25: qty 5

## 2024-01-25 MED ORDER — ALBUTEROL SULFATE (2.5 MG/3ML) 0.083% IN NEBU
3.0000 mL | INHALATION_SOLUTION | Freq: Four times a day (QID) | RESPIRATORY_TRACT | Status: DC | PRN
  Administered 2024-01-27 – 2024-01-29 (×3): 3 mL via RESPIRATORY_TRACT
  Filled 2024-01-25 (×2): qty 3

## 2024-01-25 MED ORDER — BUDESON-GLYCOPYRROL-FORMOTEROL 160-9-4.8 MCG/ACT IN AERO
2.0000 | INHALATION_SPRAY | Freq: Two times a day (BID) | RESPIRATORY_TRACT | Status: DC
  Administered 2024-01-26 – 2024-01-29 (×8): 2 via RESPIRATORY_TRACT
  Filled 2024-01-25 (×2): qty 5.9

## 2024-01-25 MED ORDER — SODIUM BICARBONATE 650 MG PO TABS
1300.0000 mg | ORAL_TABLET | Freq: Two times a day (BID) | ORAL | Status: DC
  Administered 2024-01-26 – 2024-01-30 (×8): 1300 mg via ORAL
  Filled 2024-01-25 (×8): qty 2

## 2024-01-25 MED ORDER — ATORVASTATIN CALCIUM 10 MG PO TABS
10.0000 mg | ORAL_TABLET | Freq: Every day | ORAL | Status: DC
  Administered 2024-01-26 – 2024-01-30 (×4): 10 mg via ORAL
  Filled 2024-01-25 (×4): qty 1

## 2024-01-25 MED ORDER — CHLORHEXIDINE GLUCONATE CLOTH 2 % EX PADS
6.0000 | MEDICATED_PAD | Freq: Every day | CUTANEOUS | Status: DC
  Administered 2024-01-26 – 2024-01-28 (×3): 6 via TOPICAL

## 2024-01-25 MED ORDER — HEPARIN SODIUM (PORCINE) 5000 UNIT/ML IJ SOLN: 5000.0000 [IU] | Freq: Three times a day (TID) | INTRAMUSCULAR | Status: DC

## 2024-01-25 MED ORDER — FENTANYL CITRATE (PF) 50 MCG/ML IJ SOSY
50.0000 ug | PREFILLED_SYRINGE | Freq: Once | INTRAMUSCULAR | Status: AC
  Administered 2024-01-25: 50 ug via INTRAVENOUS
  Filled 2024-01-25: qty 1

## 2024-01-25 MED ORDER — ALPRAZOLAM 0.25 MG PO TABS
0.2500 mg | ORAL_TABLET | Freq: Every evening | ORAL | Status: DC | PRN
Start: 2024-01-25 — End: 2024-01-27
  Administered 2024-01-26: 0.25 mg via ORAL
  Filled 2024-01-25: qty 1

## 2024-01-25 MED ORDER — ACETAMINOPHEN 650 MG RE SUPP: 650.0000 mg | Freq: Four times a day (QID) | RECTAL | Status: DC | PRN

## 2024-01-25 MED ORDER — FUROSEMIDE 10 MG/ML IJ SOLN
80.0000 mg | Freq: Once | INTRAMUSCULAR | Status: AC
  Administered 2024-01-25: 80 mg via INTRAVENOUS
  Filled 2024-01-25: qty 8

## 2024-01-25 MED ORDER — NALOXONE HCL 0.4 MG/ML IJ SOLN: 0.4000 mg | INTRAMUSCULAR | Status: DC | PRN

## 2024-01-25 MED ORDER — TRAZODONE HCL 50 MG PO TABS
50.0000 mg | ORAL_TABLET | Freq: Every day | ORAL | Status: DC
  Administered 2024-01-26 – 2024-01-29 (×4): 50 mg via ORAL
  Filled 2024-01-25 (×4): qty 1

## 2024-01-25 MED ORDER — LORAZEPAM 2 MG/ML IJ SOLN
0.5000 mg | Freq: Once | INTRAMUSCULAR | Status: AC
  Administered 2024-01-25: 0.5 mg via INTRAVENOUS
  Filled 2024-01-25: qty 1

## 2024-01-25 MED ORDER — OXYCODONE HCL 5 MG PO TABS
5.0000 mg | ORAL_TABLET | ORAL | Status: DC | PRN
  Administered 2024-01-26 – 2024-01-28 (×8): 5 mg via ORAL
  Filled 2024-01-25 (×9): qty 1

## 2024-01-25 MED ORDER — CALCIUM GLUCONATE-NACL 1-0.675 GM/50ML-% IV SOLN
1.0000 g | Freq: Once | INTRAVENOUS | Status: AC
  Administered 2024-01-25: 1000 mg via INTRAVENOUS
  Filled 2024-01-25: qty 50

## 2024-01-25 MED ORDER — PENTAFLUOROPROP-TETRAFLUOROETH EX AERO
INHALATION_SPRAY | CUTANEOUS | Status: AC
  Filled 2024-01-25: qty 30

## 2024-01-25 MED ORDER — GABAPENTIN 100 MG PO CAPS
100.0000 mg | ORAL_CAPSULE | Freq: Two times a day (BID) | ORAL | Status: DC
  Administered 2024-01-26 – 2024-01-30 (×8): 100 mg via ORAL
  Filled 2024-01-25 (×8): qty 1

## 2024-01-25 MED ORDER — AMLODIPINE BESYLATE 10 MG PO TABS
10.0000 mg | ORAL_TABLET | Freq: Every day | ORAL | Status: DC
  Administered 2024-01-26 – 2024-01-30 (×3): 10 mg via ORAL
  Filled 2024-01-25 (×3): qty 1

## 2024-01-25 MED ORDER — DEXTROSE 50 % IV SOLN
25.0000 mL | Freq: Once | INTRAVENOUS | Status: AC
  Administered 2024-01-25: 25 mL via INTRAVENOUS
  Filled 2024-01-25: qty 50

## 2024-01-25 MED ORDER — PANTOPRAZOLE SODIUM 40 MG PO TBEC
40.0000 mg | DELAYED_RELEASE_TABLET | Freq: Two times a day (BID) | ORAL | Status: DC
  Administered 2024-01-26 – 2024-01-30 (×8): 40 mg via ORAL
  Filled 2024-01-25 (×8): qty 1

## 2024-01-25 MED ORDER — ACETAMINOPHEN 325 MG PO TABS
650.0000 mg | ORAL_TABLET | Freq: Four times a day (QID) | ORAL | Status: DC | PRN
  Administered 2024-01-28 – 2024-01-30 (×3): 650 mg via ORAL
  Filled 2024-01-25 (×3): qty 2

## 2024-01-25 MED ORDER — CALCITRIOL 0.25 MCG PO CAPS
0.2500 ug | ORAL_CAPSULE | Freq: Every day | ORAL | Status: DC
  Administered 2024-01-26 – 2024-01-30 (×4): 0.25 ug via ORAL
  Filled 2024-01-25 (×5): qty 1

## 2024-01-25 MED ORDER — SODIUM ZIRCONIUM CYCLOSILICATE 10 G PO PACK
10.0000 g | PACK | Freq: Once | ORAL | Status: AC
  Administered 2024-01-25: 10 g via ORAL
  Filled 2024-01-25: qty 1

## 2024-01-25 NOTE — Consult Note (Signed)
 Renal Service Consult Note [facility] Lamar JONETTA Fret, MD  Patient: [name] Date: 01/25/2024 Requesting Physician: Dr. Lenor, ED MD  Reason for Consult: ESRD pt w/ resp distress and severe hyperkalemia HPI: The patient is a 63 y.o. year-old w/ PMH as below who presented to ED this evening c/o swelling of legs and SOB for weeks. Was here as inpatient from 10/14- 11/01 and had 8 inpatient HD sessions, leaving at 55.7kg (admit wt was 53kg). She has severe ascites requiring LVP as well. She has hx of homelessness. She was dc'd w/ a list of housing resources provided by [facility]. She also sometimes has a hotel room which she pays for with her widow's check. She no longer has an outpatient HD unit and comes to the hospital for hospital dialysis. In ED BP 130/ 56, HR 88, RR 18, temp 98. Na 122, K+ 7.0, CO2 17, bun 79 and creatinine 6.9. BNP 1059, Hb 8.8. Pt's resp status deteriorated and she was placed on bipap. IV insulin / glucose were ordered as temporizing measures for the high K+, took lokelma  also. Pt is to be admitted. We are asked to see for dialysis.    Pt seen in ED room. Pt is sedated after IV ativan . Poor historian w/ bipap and sedation.    ROS - n/a   Past Medical History  Past Medical History:  Diagnosis Date   Acute on chronic blood loss anemia 08/01/2022   Acute on chronic diastolic CHF (congestive heart failure) (HCC) 08/12/2022   Acute on chronic respiratory failure with hypoxia (HCC) 05/16/2023   Acute pulmonary edema (HCC) 05/16/2023   Acute seasonal allergic rhinitis due to pollen 02/22/2016   Alcohol  abuse    Allergy    Anxiety    Arteriovenous fistula infection, initial encounter 04/16/2023   Arthritis    Asthma    Cannabis abuse    Cocaine abuse (HCC)    COPD (chronic obstructive pulmonary disease) (HCC)    COPD with acute exacerbation (HCC) 10/25/2022   COVID 05/16/2023   Depression    Drug addiction (HCC)    GERD (gastroesophageal reflux  disease)    GIB (gastrointestinal bleeding) 08/19/2019   Heart murmur    Heme positive stool 08/14/2022   Homelessness    Hx of adenomatous colonic polyps    Hyperlipidemia    Hypertension    pt stated every once in a while BP will be high but has not been prescribed medication for HTN.    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) 02/22/2016   SIRS (systemic inflammatory response syndrome) (HCC) 10/25/2022   Past Surgical History  Past Surgical History:  Procedure Laterality Date   A/V FISTULAGRAM Right 01/31/2023   Procedure: A/V Fistulagram;  Surgeon: Gretta Lonni PARAS, MD;  Location: [facility] INVASIVE CV [facility];  Service: Cardiovascular;  Laterality: Right;   AV FISTULA PLACEMENT Right 08/14/2022   Procedure: RIGHT ARM BRACHIOBASILIC ATERIOVENOUS FISTULA CREATION;  Surgeon: Magda Debby SAILOR, MD;  Location: [facility] OR;  Service: Vascular;  Laterality: Right;   BASCILIC VEIN TRANSPOSITION Right 11/13/2022   Procedure: RIGHT ARM SECOND STAGE BASILIC VEIN TRANSPOSITION;  Surgeon: Magda Debby SAILOR, MD;  Location: [facility] OR;  Service: Vascular;  Laterality: Right;  BIOPSY  01/01/2019   Procedure: BIOPSY;  Surgeon: Albertus Gordy HERO, MD;  Location: [facility] ENDOSCOPY;  Service: Endoscopy;;   BIOPSY  11/17/2021   Procedure: BIOPSY;  Surgeon: Charlanne Groom, MD;  Location: [facility];  Service: Gastroenterology;;   CESAREAN SECTION  1989   COLONOSCOPY  11/07/2020   2018   CYSTOSCOPY W/ URETERAL STENT PLACEMENT Left 08/01/2018   Procedure: CYSTOSCOPY WITH RETROGRADE PYELOGRAM/URETERAL STENT PLACEMENT;  Surgeon: Carolee Sherwood JONETTA DOUGLAS, MD;  Location: [facility] ORS;  Service: Urology;  Laterality: Left;   CYSTOSCOPY WITH RETROGRADE PYELOGRAM, URETEROSCOPY AND STENT  PLACEMENT Left 01/15/2019   Procedure: CYSTOSCOPY WITH RETROGRADE PYELOGRAM, URETEROSCOPY AND STENT PLACEMENT;  Surgeon: Carolee Sherwood JONETTA DOUGLAS, MD;  Location: [facility] ORS;  Service: Urology;  Laterality: Left;   ENTEROSCOPY N/A 08/16/2022   Procedure: ENTEROSCOPY;  Surgeon: Wilhelmenia Aloha Raddle., MD;  Location: [facility] ENDOSCOPY;  Service: Gastroenterology;  Laterality: N/A;   ENTEROSCOPY N/A 10/31/2022   Procedure: ENTEROSCOPY;  Surgeon: Albertus Gordy HERO, MD;  Location: [facility] ENDOSCOPY;  Service: Gastroenterology;  Laterality: N/A;   ENTEROSCOPY N/A 05/09/2023   Procedure: ENTEROSCOPY;  Surgeon: Federico Rosario BROCKS, MD;  Location: [facility] ENDOSCOPY;  Service: Gastroenterology;  Laterality: N/A;   ENTEROSCOPY N/A 09/09/2023   Procedure: ENTEROSCOPY;  Surgeon: Federico Rosario BROCKS, MD;  Location: [facility] ENDOSCOPY;  Service: Gastroenterology;  Laterality: N/A;   ENTEROSCOPY N/A 10/26/2023   Procedure: ENTEROSCOPY;  Surgeon: Albertus Gordy HERO, MD;  Location: [facility] ENDOSCOPY;  Service: Gastroenterology;  Laterality: N/A;   ESOPHAGOGASTRODUODENOSCOPY (EGD) WITH PROPOFOL  N/A 01/01/2019   Procedure: ESOPHAGOGASTRODUODENOSCOPY (EGD) WITH PROPOFOL ;  Surgeon: Albertus Gordy HERO, MD;  Location: [facility] ENDOSCOPY;  Service: Endoscopy;  Laterality: N/A;   ESOPHAGOGASTRODUODENOSCOPY (EGD) WITH PROPOFOL  N/A 08/21/2019   Procedure: ESOPHAGOGASTRODUODENOSCOPY (EGD) WITH PROPOFOL ;  Surgeon: Shila Gustav GAILS, MD;  Location: [facility] ENDOSCOPY;  Service: Endoscopy;  Laterality: N/A;   ESOPHAGOGASTRODUODENOSCOPY (EGD) WITH PROPOFOL  N/A 11/17/2021   Procedure: ESOPHAGOGASTRODUODENOSCOPY (EGD) WITH PROPOFOL ;  Surgeon: Charlanne Groom, MD;  Location: [facility] ENDOSCOPY;  Service: Gastroenterology;  Laterality: N/A;   FRACTURE SURGERY Left 2011   arm   HEMOSTASIS CLIP PLACEMENT  08/16/2022   Procedure: HEMOSTASIS CLIP PLACEMENT;  Surgeon: Wilhelmenia Aloha Raddle., MD;  Location: [facility] ENDOSCOPY;  Service: Gastroenterology;;   HOT HEMOSTASIS N/A 08/16/2022   Procedure: HOT HEMOSTASIS (ARGON  PLASMA COAGULATION/BICAP);  Surgeon: Wilhelmenia Aloha Raddle., MD;  Location: [facility] ENDOSCOPY;  Service: Gastroenterology;  Laterality: N/A;   HOT HEMOSTASIS N/A 10/31/2022   Procedure: HOT HEMOSTASIS (ARGON PLASMA COAGULATION/BICAP);  Surgeon: Albertus Gordy HERO, MD;  Location: [facility] ENDOSCOPY;  Service: Gastroenterology;  Laterality: N/A;   HOT HEMOSTASIS N/A 05/09/2023   Procedure: HOT HEMOSTASIS (ARGON PLASMA COAGULATION/BICAP);  Surgeon: Federico Rosario BROCKS, MD;  Location: [facility] ENDOSCOPY;  Service: Gastroenterology;  Laterality: N/A;   HOT HEMOSTASIS N/A 09/09/2023   Procedure: EGD, WITH ARGON PLASMA COAGULATION;  Surgeon: Federico Rosario BROCKS, MD;  Location: [facility] ENDOSCOPY;  Service: Gastroenterology;  Laterality: N/A;   INSERTION OF DIALYSIS CATHETER Right 08/14/2022   Procedure: INSERTION OF TUNNELED DIALYSIS CATHETER USING PALINDROME CATHETER KIT 19CM;  Surgeon: Magda Debby SAILOR, MD;  Location: [facility] OR;  Service: Vascular;  Laterality: Right;   IR PARACENTESIS  01/02/2024   IR THORACENTESIS ASP PLEURAL SPACE W/IMG GUIDE  09/15/2023   POLYPECTOMY  08/16/2022   Procedure: POLYPECTOMY;  Surgeon: Wilhelmenia Aloha Raddle., MD;  Location: [facility] ENDOSCOPY;  Service: Gastroenterology;;   SUBMUCOSAL TATTOO INJECTION  08/16/2022   Procedure: SUBMUCOSAL TATTOO INJECTION;  Surgeon: Wilhelmenia Aloha Raddle., MD;  Location: [facility] ENDOSCOPY;  Service: Gastroenterology;;   UPPER GASTROINTESTINAL ENDOSCOPY     Family History  Family History  Problem Relation Age of Onset   Diabetes Father    Colon cancer Neg Hx    Esophageal cancer Neg Hx    Rectal cancer Neg Hx    Stomach cancer Neg Hx    Social History  reports that she has been smoking cigarettes. She has been exposed to tobacco smoke. She has never used smokeless tobacco. She reports that she does not currently use alcohol . She reports current drug use. Drug: Marijuana. Allergies  Allergies  Allergen Reactions   Zestril  [Lisinopril ] Other (See Comments)    Hyperkalemia   Home  medications Prior to Admission medications   Medication Sig Start Date End Date Taking? Authorizing Provider  acetaminophen  (TYLENOL ) 325 MG tablet Take 2 tablets (650 mg total) by mouth every 6 (six) hours as needed for mild pain (pain score 1-3). 11/02/23   Dennise Lavada POUR, MD  albuterol  (VENTOLIN  HFA) 108 [phone] Base) MCG/ACT inhaler Inhale 2 puffs into the lungs every 6 (six) hours as needed for wheezing or shortness of breath. 11/02/23   Singh, Prashant K, MD  ALPRAZolam  (XANAX ) 0.25 MG tablet Take 1 tablet (0.25 mg total) by mouth at bedtime as needed for up to 14 days for anxiety. 01/16/24 01/30/24  Raenelle Coria, MD  amLODipine  (NORVASC ) 10 MG tablet Take 1 tablet (10 mg total) by mouth daily. 11/02/23   Singh, Prashant K, MD  atorvastatin  (LIPITOR) 10 MG tablet Take 1 tablet (10 mg total) by mouth daily. 11/02/23   Singh, Prashant K, MD  calcitRIOL  (ROCALTROL ) 0.25 MCG capsule Take 1 capsule (0.25 mcg total) by mouth daily. 11/02/23   Singh, Prashant K, MD  carvedilol  (COREG ) 6.25 MG tablet Take 1 tablet (6.25 mg total) by mouth 2 (two) times daily with a meal. 11/02/23   Dennise Lavada POUR, MD  ferrous sulfate  (FEROSUL) 325 (65 FE) MG tablet Take 1 tablet (325 mg total) by mouth daily with breakfast. 11/02/23   Dennise Lavada POUR, MD  gabapentin  (NEURONTIN ) 100 MG capsule Take 1 capsule (100 mg total) by mouth 2 (two) times daily for 60 doses. 11/02/23 12/30/23  Dennise Lavada POUR, MD  nicotine  (NICODERM CQ  - DOSED IN MG/24 HOURS) 21 mg/24hr patch Place 1 patch (21 mg total) onto the skin daily. 11/02/23   Singh, Prashant K, MD  oxyCODONE  (ROXICODONE ) 5 MG immediate release tablet Take 1 tablet (5 mg total) by mouth every 4 (four) hours as needed for severe pain (pain score 7-10). 01/16/24   Ghimire, Kuber, MD  pantoprazole  (PROTONIX ) 40 MG tablet Take 1 tablet (40 mg total) by mouth 2 (two) times daily. 11/02/23 12/02/23  Singh, Prashant K, MD  polyethylene glycol powder (GLYCOLAX /MIRALAX ) 17 GM/SCOOP  powder Take 17 g by mouth daily as needed. Dissolve 1 capful (17g) in 4-8 ounces of liquid and take by mouth daily. 01/16/24   Ghimire, Kuber, MD  sodium bicarbonate  650 MG tablet Take 2 tablets (1,300 mg total) by mouth 2 (two) times daily. 11/02/23 01/31/24  Dennise Lavada POUR, MD  torsemide  (DEMADEX ) 100 MG tablet Take 1 tablet (100 mg total) by mouth daily. 01/16/24 02/15/24  Raenelle Coria, MD  traZODone  (DESYREL ) 50 MG tablet Take 50 mg by mouth at bedtime. 12/24/23   [provider]  TRELEGY ELLIPTA  100-62.5-25 MCG/ACT AEPB Inhale 1 puff into the lungs daily. 11/02/23   Dennise Lavada POUR, MD     Vitals:  01/25/24 1741 01/25/24 1745 01/25/24 1800 01/25/24 1820  BP: 124/73 122/70 (!) 124/56   Pulse: 91 88 89   Resp: (!) 23 20 19    Temp: 98.5 F (36.9 C)     TempSrc: Oral     SpO2: 93% (!) 87% 93% 95%  Weight:       Exam Gen sedated, on bipap, awakens to voice, stable Sclera anicteric, throat clear  No jvd or bruits Chest clear bilat to bases RRR no MRG Abd soft ntnd no mass or ascites +bs Ext bilat firm edema of the LE's, no other edema Neuro is alert, Ox 3 , nf    RUA AVF+bruit   Home bp meds: Norvasc  10 every day Coreg  6.25 bid  Torsemide  100mg  every day    OP HD: does not have a HD unit Has RUA AVF now  From aug 2024 --> 3h  B350 Heparin  none   CXR 11/09 - IMPRESSION: Cardiomegaly with basilar predominant edema and new moderate left pleural effusion. Stable small right pleural effusion.   Assessment/ Plan: SOB/ vol overload: diffuse body edema (tight) and SOB requiring bipap in ED. Pt stable on bipap. CXR w/ new L effusion, vasc congestion.  Plan is for HD tonight, probably upstairs w/ bipap support.  ESRD: does not have a HD unit. Comes to hospital for dialysis.  HTN: bp's here are not high, no need for home meds yet.  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 Follow.  Homelessness:  not sure current status, review when pt is more comfortable.      Myer Fret  MD [facility] 01/25/2024, 8:08 PM  Recent Labs  Lab 01/25/24 1743 01/25/24 1754  HGB 8.2* 8.8*  CALCIUM  7.1*  --   CREATININE 6.18* 6.90*  K 7.0* 6.6*   Inpatient medications:

## 2024-01-25 NOTE — Progress Notes (Signed)
 RT placed pt on bipap per order for respiratory distress. Pt states she will try to tolerate it for as [place] as she can due to her anxiety. RT explained how important it is to try to keep it on but pt keeps expressing that she will only do it for a few minutes most likely.

## 2024-01-25 NOTE — ED Notes (Signed)
 Transfer of care report called to Jordan RN.  Discussed pt Dx, Sx, Hx, medications administered, vitals, lines, labs, and pt current condition.  All questions asked were answered.    RT called and notified of need for transport to dialysis.    Jordan stated he will call transport to arrange for transport.

## 2024-01-25 NOTE — H&P (Signed)
 History and Physical    [name] FMW:[id number] DOB: 1960/06/27 DOA: 01/25/2024  PCP: Pcp, No  Chief Complaint: Shortness of breath  HPI: [name] is a 63 y.o. female with medical history significant of ESRD on HD MWF with history of dialysis noncompliance, homelessness, anemia of chronic disease, COPD, HFpEF (echo 10/23/2023 with EF 60 to 65%, grade 2 diastolic dysfunction, mild mitral regurgitation), hypertension, hyperlipidemia, PAD, peripheral neuropathy, chronic pain syndrome, polysubstance abuse (cocaine, marijuana, alcohol , tobacco), anxiety, depression, GERD.  Recent hospital admission 10/14-10/31/2025 for severe constipation and ileus, cirrhosis with ascites requiring paracentesis, worsening anemia requiring blood transfusions, decompensated CHF, and UTI.  Patient presents to the ED today with a chief complaint of shortness of breath. Oxygen  saturation 85% on room air with EMS and was given 2 nebulizer treatments.  Placed on BiPAP in the ED due to respiratory distress.  CBC showing no leukocytosis, hemoglobin 8.2 (stable since recent hospital discharge), and platelet count 472k.  BMP showing sodium 125, potassium 7.0, chloride 89, bicarb 17, glucose 100, and calcium  7.1.  BNP 1059.  Troponin negative.  Chest x-ray showing cardiomegaly with basilar predominant edema and new moderate left pleural effusion.  Showing stable small right pleural effusion.  EKG showing sinus rhythm and no acute changes.  Patient was given IV Lasix  80 mg, IV Ativan  0.5 mg, Lokelma  10 g, NovoLog  5 units/dextrose , and IV calcium  gluconate 1 g.  Nephrology consulted and planning on emergent dialysis tonight.  [facility] called to admit.  History limited as patient is currently on BiPAP.  She tells me that she has not gone for dialysis since her hospital discharge on October 31st due to transportation issues and now having progressively worsening dyspnea, cough, orthopnea, and swelling of her abdomen and legs.  Also  reporting aching substernal chest pain for the past 3 or 4 days.  Denies fevers, abdominal pain, nausea, vomiting, diarrhea, or constipation.  Review of Systems:  Review of Systems  All other systems reviewed and are negative.   Past Medical History:  Diagnosis Date   Acute on chronic blood loss anemia 08/01/2022   Acute on chronic diastolic CHF (congestive heart failure) (HCC) 08/12/2022   Acute on chronic respiratory failure with hypoxia (HCC) 05/16/2023   Acute pulmonary edema (HCC) 05/16/2023   Acute seasonal allergic rhinitis due to pollen 02/22/2016   Alcohol  abuse    Allergy    Anxiety    Arteriovenous fistula infection, initial encounter 04/16/2023   Arthritis    Asthma    Cannabis abuse    Cocaine abuse (HCC)    COPD (chronic obstructive pulmonary disease) (HCC)    COPD with acute exacerbation (HCC) 10/25/2022   COVID 05/16/2023   Depression    Drug addiction (HCC)    GERD (gastroesophageal reflux disease)    GIB (gastrointestinal bleeding) 08/19/2019   Heart murmur    Heme positive stool 08/14/2022   Homelessness    Hx of adenomatous colonic polyps    Hyperlipidemia    Hypertension    pt stated every once in a while BP will be high but has not been prescribed medication for HTN.    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) 02/22/2016   SIRS (systemic  inflammatory response syndrome) (HCC) 10/25/2022    Past Surgical History:  Procedure Laterality Date   A/V FISTULAGRAM Right 01/31/2023   Procedure: A/V Fistulagram;  Surgeon: Gretta Lonni PARAS, MD;  Location: [facility] INVASIVE CV LAB;  Service: Cardiovascular;  Laterality: Right;   AV FISTULA PLACEMENT Right 08/14/2022   Procedure: RIGHT ARM  BRACHIOBASILIC ATERIOVENOUS FISTULA CREATION;  Surgeon: Magda Debby SAILOR, MD;  Location: [facility] OR;  Service: Vascular;  Laterality: Right;   BASCILIC VEIN TRANSPOSITION Right 11/13/2022   Procedure: RIGHT ARM SECOND STAGE BASILIC VEIN TRANSPOSITION;  Surgeon: Magda Debby SAILOR, MD;  Location: [facility] OR;  Service: Vascular;  Laterality: Right;   BIOPSY  01/01/2019   Procedure: BIOPSY;  Surgeon: Albertus Gordy HERO, MD;  Location: [facility] ENDOSCOPY;  Service: Endoscopy;;   BIOPSY  11/17/2021   Procedure: BIOPSY;  Surgeon: Charlanne Groom, MD;  Location: [facility] ENDOSCOPY;  Service: Gastroenterology;;   CESAREAN SECTION  1989   COLONOSCOPY  11/07/2020   2018   CYSTOSCOPY W/ URETERAL STENT PLACEMENT Left 08/01/2018   Procedure: CYSTOSCOPY WITH RETROGRADE PYELOGRAM/URETERAL STENT PLACEMENT;  Surgeon: Carolee Sherwood JONETTA DOUGLAS, MD;  Location: [facility] ORS;  Service: Urology;  Laterality: Left;   CYSTOSCOPY WITH RETROGRADE PYELOGRAM, URETEROSCOPY AND STENT PLACEMENT Left 01/15/2019   Procedure: CYSTOSCOPY WITH RETROGRADE PYELOGRAM, URETEROSCOPY AND STENT PLACEMENT;  Surgeon: Carolee Sherwood JONETTA DOUGLAS, MD;  Location: [facility] ORS;  Service: Urology;  Laterality: Left;   ENTEROSCOPY N/A 08/16/2022   Procedure: ENTEROSCOPY;  Surgeon: Wilhelmenia Aloha Raddle., MD;  Location: [facility] ENDOSCOPY;  Service: Gastroenterology;  Laterality: N/A;   ENTEROSCOPY N/A 10/31/2022   Procedure: ENTEROSCOPY;  Surgeon: Albertus Gordy HERO, MD;  Location: [facility] ENDOSCOPY;  Service: Gastroenterology;  Laterality: N/A;   ENTEROSCOPY N/A 05/09/2023   Procedure: ENTEROSCOPY;  Surgeon: Federico Rosario BROCKS, MD;  Location: [facility] ENDOSCOPY;  Service: Gastroenterology;  Laterality: N/A;   ENTEROSCOPY N/A 09/09/2023   Procedure: ENTEROSCOPY;  Surgeon: Federico Rosario BROCKS, MD;  Location: [facility] ENDOSCOPY;  Service: Gastroenterology;  Laterality: N/A;   ENTEROSCOPY N/A 10/26/2023   Procedure: ENTEROSCOPY;  Surgeon: Albertus Gordy HERO, MD;  Location: [facility] ENDOSCOPY;  Service: Gastroenterology;  Laterality: N/A;    ESOPHAGOGASTRODUODENOSCOPY (EGD) WITH PROPOFOL  N/A 01/01/2019   Procedure: ESOPHAGOGASTRODUODENOSCOPY (EGD) WITH PROPOFOL ;  Surgeon: Albertus Gordy HERO, MD;  Location: [facility] ENDOSCOPY;  Service: Endoscopy;  Laterality: N/A;   ESOPHAGOGASTRODUODENOSCOPY (EGD) WITH PROPOFOL  N/A 08/21/2019   Procedure: ESOPHAGOGASTRODUODENOSCOPY (EGD) WITH PROPOFOL ;  Surgeon: Shila Gustav GAILS, MD;  Location: [facility] ENDOSCOPY;  Service: Endoscopy;  Laterality: N/A;   ESOPHAGOGASTRODUODENOSCOPY (EGD) WITH PROPOFOL  N/A 11/17/2021   Procedure: ESOPHAGOGASTRODUODENOSCOPY (EGD) WITH PROPOFOL ;  Surgeon: Charlanne Groom, MD;  Location: [facility] ENDOSCOPY;  Service: Gastroenterology;  Laterality: N/A;   FRACTURE SURGERY Left 2011   arm   HEMOSTASIS CLIP PLACEMENT  08/16/2022   Procedure: HEMOSTASIS CLIP PLACEMENT;  Surgeon: Wilhelmenia Aloha Raddle., MD;  Location: [facility] ENDOSCOPY;  Service: Gastroenterology;;   HOT HEMOSTASIS N/A 08/16/2022   Procedure: HOT HEMOSTASIS (ARGON PLASMA COAGULATION/BICAP);  Surgeon: Wilhelmenia Aloha Raddle., MD;  Location: [facility] ENDOSCOPY;  Service: Gastroenterology;  Laterality: N/A;   HOT HEMOSTASIS N/A 10/31/2022   Procedure: HOT HEMOSTASIS (ARGON PLASMA COAGULATION/BICAP);  Surgeon: Albertus Gordy HERO, MD;  Location: [facility] ENDOSCOPY;  Service: Gastroenterology;  Laterality: N/A;   HOT HEMOSTASIS N/A 05/09/2023   Procedure: HOT HEMOSTASIS (ARGON PLASMA COAGULATION/BICAP);  Surgeon: Federico Rosario BROCKS, MD;  Location: [facility] ENDOSCOPY;  Service: Gastroenterology;  Laterality: N/A;   HOT HEMOSTASIS N/A 09/09/2023   Procedure: EGD, WITH ARGON PLASMA COAGULATION;  Surgeon: Federico Rosario BROCKS, MD;  Location: [facility] ENDOSCOPY;  Service: Gastroenterology;  Laterality: N/A;   INSERTION OF DIALYSIS CATHETER Right 08/14/2022   Procedure: INSERTION OF TUNNELED DIALYSIS CATHETER USING PALINDROME CATHETER KIT 19CM;  Surgeon: Magda Debby SAILOR, MD;  Location: [facility] OR;  Service: Vascular;  Laterality: Right;   IR PARACENTESIS  01/02/2024   IR THORACENTESIS ASP  PLEURAL SPACE W/IMG GUIDE  09/15/2023   POLYPECTOMY  08/16/2022   Procedure: POLYPECTOMY;  Surgeon: Wilhelmenia Aloha Raddle., MD;  Location: [facility] ENDOSCOPY;  Service: Gastroenterology;;   SUBMUCOSAL TATTOO INJECTION  08/16/2022   Procedure: SUBMUCOSAL TATTOO INJECTION;  Surgeon: Wilhelmenia Aloha Raddle., MD;  Location: [facility] ENDOSCOPY;  Service: Gastroenterology;;   UPPER GASTROINTESTINAL ENDOSCOPY       reports that she has been smoking cigarettes. She has been exposed to tobacco smoke. She has never used smokeless tobacco. She reports that she does not currently use alcohol . She reports current drug use. Drug: Marijuana.  Allergies  Allergen Reactions   Zestril  [Lisinopril ] Other (See Comments)    Hyperkalemia    Family History  Problem Relation Age of Onset   Diabetes Father    Colon cancer Neg Hx    Esophageal cancer Neg Hx    Rectal cancer Neg Hx    Stomach cancer Neg Hx     Prior to Admission medications   Medication Sig Start Date End Date Taking? Authorizing Provider  acetaminophen  (TYLENOL ) 325 MG tablet Take 2 tablets (650 mg total) by mouth every 6 (six) hours as needed for mild pain (pain score 1-3). 11/02/23   Dennise Lavada POUR, MD  albuterol  (VENTOLIN  HFA) 108 (90 Base) MCG/ACT inhaler Inhale 2 puffs into the lungs every 6 (six) hours as needed for wheezing or shortness of breath. 11/02/23   Singh, Prashant K, MD  ALPRAZolam  (XANAX ) 0.25 MG tablet Take 1 tablet (0.25 mg total) by mouth at bedtime as needed for up to 14 days for anxiety. 01/16/24 01/30/24  Raenelle Coria, MD  amLODipine  (NORVASC ) 10 MG tablet Take 1 tablet (10 mg total) by mouth daily. 11/02/23   Singh, Prashant K, MD  atorvastatin  (LIPITOR) 10 MG tablet Take 1 tablet (10 mg total) by mouth daily. 11/02/23   Singh, Prashant K, MD  calcitRIOL  (ROCALTROL ) 0.25 MCG capsule Take 1 capsule (0.25 mcg total) by mouth daily. 11/02/23   Singh, Prashant K, MD  carvedilol  (COREG ) 6.25 MG tablet Take 1 tablet (6.25 mg total) by  mouth 2 (two) times daily with a meal. 11/02/23   Dennise Lavada POUR, MD  ferrous sulfate  (FEROSUL) 325 (65 FE) MG tablet Take 1 tablet (325 mg total) by mouth daily with breakfast. 11/02/23   Singh, Prashant K, MD  gabapentin  (NEURONTIN ) 100 MG capsule Take 1 capsule (100 mg total) by mouth 2 (two) times daily for 60 doses. 11/02/23 12/30/23  Dennise Lavada POUR, MD  nicotine  (NICODERM CQ  - DOSED IN MG/24 HOURS) 21 mg/24hr patch Place 1 patch (21 mg total) onto the skin daily. 11/02/23   Singh, Prashant K, MD  oxyCODONE  (ROXICODONE ) 5 MG immediate release tablet Take 1 tablet (5 mg total) by mouth every 4 (four) hours as needed for severe pain (pain score 7-10). 01/16/24   Raenelle Coria, MD  pantoprazole  (PROTONIX ) 40 MG tablet Take 1 tablet (40 mg total) by mouth 2 (two) times daily. 11/02/23 12/02/23  Singh, Prashant K, MD  polyethylene glycol powder (GLYCOLAX /MIRALAX ) 17 GM/SCOOP powder Take 17 g by mouth daily as needed. Dissolve 1 capful (17g) in  4-8 ounces of liquid and take by mouth daily. 01/16/24   Raenelle Coria, MD  sodium bicarbonate  650 MG tablet Take 2 tablets (1,300 mg total) by mouth 2 (two) times daily. 11/02/23 01/31/24  Dennise Lavada POUR, MD  torsemide  (DEMADEX ) 100 MG tablet Take 1 tablet (100 mg total) by mouth daily. 01/16/24 02/15/24  Raenelle Coria, MD  traZODone  (DESYREL ) 50 MG tablet Take 50 mg by mouth at bedtime. 12/24/23   [provider]  TRELEGY ELLIPTA  100-62.5-25 MCG/ACT AEPB Inhale 1 puff into the lungs daily. 11/02/23   Dennise Lavada POUR, MD    Physical Exam: Vitals:   01/25/24 1741 01/25/24 1745 01/25/24 1800 01/25/24 1820  BP: 124/73 122/70 (!) 124/56   Pulse: 91 88 89   Resp: (!) 23 20 19    Temp: 98.5 F (36.9 C)     TempSrc: Oral     SpO2: 93% (!) 87% 93% 95%  Weight:        Physical Exam Vitals reviewed.  Constitutional:      General: She is not in acute distress. HENT:     Head: Normocephalic and atraumatic.  Eyes:     Comments: Unable to  examine as patient will not open her eyes  Cardiovascular:     Rate and Rhythm: Normal rate and regular rhythm.     Heart sounds: Normal heart sounds.  Pulmonary:     Effort: Pulmonary effort is normal. No respiratory distress.     Breath sounds: Rhonchi present.  Abdominal:     General: Bowel sounds are normal. There is distension.     Palpations: Abdomen is soft.     Tenderness: There is no abdominal tenderness. There is no guarding.     Comments: Tense distended abdomen with normal bowel sounds and no tenderness  Musculoskeletal:     Cervical back: Normal range of motion.     Right lower leg: Edema present.     Left lower leg: Edema present.  Skin:    General: Skin is warm and dry.     Comments: Small area of ulceration on the medial malleolus area of the right ankle.  There is mild exudative material but no surrounding erythema or signs of cellulitis.  Neurological:     General: No focal deficit present.     Mental Status: She is alert and oriented to person, place, and time.     Labs on Admission: I have personally reviewed following labs and imaging studies  CBC: Recent Labs  Lab 01/25/24 1743 01/25/24 1754  WBC 7.9  --   NEUTROABS 6.1  --   HGB 8.2* 8.8*  HCT 26.4* 26.0*  MCV 99.6  --   PLT 472*  --    Basic Metabolic Panel: Recent Labs  Lab 01/25/24 1743 01/25/24 1754  NA 125* 122*  K 7.0* 6.6*  CL 89* 94*  CO2 17*  --   GLUCOSE 100* 105*  BUN 71* 79*  CREATININE 6.18* 6.90*  CALCIUM  7.1*  --    GFR: Estimated Creatinine Clearance: 6.9 mL/min (A) (by C-G formula based on SCr of 6.9 mg/dL (H)). Liver Function Tests: No results for input(s): AST, ALT, ALKPHOS, BILITOT, PROT, ALBUMIN  in the last 168 hours. No results for input(s): LIPASE, AMYLASE in the last 168 hours. No results for input(s): AMMONIA in the last 168 hours. Coagulation Profile: No results for input(s): INR, PROTIME in the last 168 hours. Cardiac Enzymes: No  results for input(s): CKTOTAL, CKMB, CKMBINDEX, TROPONINI in the last  168 hours. BNP (last 3 results) No results for input(s): PROBNP in the last 8760 hours. HbA1C: No results for input(s): HGBA1C in the last 72 hours. CBG: Recent Labs  Lab 01/25/24 2009  GLUCAP 88   Lipid Profile: No results for input(s): CHOL, HDL, LDLCALC, TRIG, CHOLHDL, LDLDIRECT in the last 72 hours. Thyroid Function Tests: No results for input(s): TSH, T4TOTAL, FREET4, T3FREE, THYROIDAB in the last 72 hours. Anemia Panel: No results for input(s): VITAMINB12, FOLATE, FERRITIN, TIBC, IRON , RETICCTPCT in the last 72 hours. Urine analysis:    Component Value Date/Time   COLORURINE YELLOW 12/30/2023 1305   APPEARANCEUR CLOUDY (A) 12/30/2023 1305   LABSPEC 1.011 12/30/2023 1305   PHURINE 6.0 12/30/2023 1305   GLUCOSEU 50 (A) 12/30/2023 1305   HGBUR SMALL (A) 12/30/2023 1305   BILIRUBINUR NEGATIVE 12/30/2023 1305   KETONESUR NEGATIVE 12/30/2023 1305   PROTEINUR 100 (A) 12/30/2023 1305   UROBILINOGEN 0.2 03/02/2010 2027   NITRITE NEGATIVE 12/30/2023 1305   LEUKOCYTESUR LARGE (A) 12/30/2023 1305    Radiological Exams on Admission: DG Chest Port 1 View Result Date: 01/25/2024 EXAM: 1 VIEW(S) XRAY OF THE CHEST 01/25/2024 06:47:00 PM COMPARISON: 12/30/2023 CLINICAL HISTORY: SOB FINDINGS: LUNGS AND PLEURA: New moderate left pleural effusion. Stable small right pleural effusion. Bibasilar consolidation/atelectasis, left greater than right. Mild pulmonary edema. No pneumothorax. HEART AND MEDIASTINUM: Stable cardiomegaly. Aortic atherosclerosis (ICD10-170.0). BONES AND SOFT TISSUES: No acute osseous abnormality. IMPRESSION: 1. Cardiomegaly with basilar predominant edema and new moderate left pleural effusion. Stable small right pleural effusion. Electronically signed by: Norman Gatlin MD 01/25/2024 07:09 PM EST RP Workstation: [id number]    Assessment and Plan  Acute  hypoxemic respiratory failure secondary to volume overload/pulmonary edema/new moderate left pleural effusion Acute on chronic HFpEF Dialysis noncompliance Severe hyperkalemia Patient is currently stable on BiPAP.  She has been given temporizing measures for hyperkalemia in the ED including IV Lasix , Lokelma , insulin , and IV calcium  gluconate.  No acute EKG changes.  Nephrology planning on emergent dialysis tonight.  Continue to monitor labs.  Chest pain Nonexertional and ongoing for the past 3 to 4 days.  Suspect this is related to volume overload.  EKG without acute changes and troponin negative.  Tense distended abdomen Likely due to ascites/volume overload and ultrasound has been ordered for further evaluation.  Patient denies abdominal pain, nausea, vomiting, or constipation.  She has normal bowel sounds on exam and abdomen nontender to palpation.  Dialysis as above and may need repeat paracentesis in the morning.  No fever or leukocytosis to suggest SBP.  History of alcohol  abuse Patient reports not drinking alcohol  for the past 1 month.  No signs of withdrawal at this time.  Anemia of chronic disease Hemoglobin stable, monitor labs.  Homelessness/transportation issues TOC consulted.  COPD: No wheezing. Hypertension: Blood pressure not elevated. Hyperlipidemia PAD Chronic pain syndrome/peripheral neuropathy Anxiety/depression GERD Continue home medications.  DVT prophylaxis: SCDs Code Status: DNR/DNI (discussed with the patient) Consults called: Nephrology Level of care: Progressive Care Unit Admission status: It is my clinical opinion that admission to INPATIENT is reasonable and necessary because of the expectation that this patient will require hospital care that crosses at least 2 midnights to treat this condition based on the medical complexity of the problems presented.  Given the aforementioned information, the predictability of an adverse outcome is felt to be  significant.  Cynthia Ram MD [facility]  If 7PM-7AM, please contact night-coverage [email or web address]  01/25/2024, 8:32 PM
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
## 2024-01-25 NOTE — ED Triage Notes (Signed)
 BIB EMS from home r/t SOB and edema x weeks. Missed 9 diaylsis appointments x9 d/t transportation. EMS administered breathing tx with improved work of breathing. EMS reports patient 85% on RA. A&Ox4.

## 2024-01-26 ENCOUNTER — Other Ambulatory Visit: Payer: Self-pay

## 2024-01-26 ENCOUNTER — Inpatient Hospital Stay (HOSPITAL_COMMUNITY)

## 2024-01-26 DIAGNOSIS — J810 Acute pulmonary edema: Secondary | ICD-10-CM

## 2024-01-26 DIAGNOSIS — N186 End stage renal disease: Secondary | ICD-10-CM

## 2024-01-26 DIAGNOSIS — Z992 Dependence on renal dialysis: Secondary | ICD-10-CM

## 2024-01-26 DIAGNOSIS — E875 Hyperkalemia: Secondary | ICD-10-CM

## 2024-01-26 LAB — CBC
HCT: 25.4 % — ABNORMAL LOW (ref 36.0–46.0)
Hemoglobin: 8.2 g/dL — ABNORMAL LOW (ref 12.0–15.0)
MCH: 30.7 pg (ref 26.0–34.0)
MCHC: 32.3 g/dL (ref 30.0–36.0)
MCV: 95.1 fL (ref 80.0–100.0)
Platelets: 451 K/uL — ABNORMAL HIGH (ref 150–400)
RBC: 2.67 MIL/uL — ABNORMAL LOW (ref 3.87–5.11)
RDW: 16.9 % — ABNORMAL HIGH (ref 11.5–15.5)
WBC: 9.5 K/uL (ref 4.0–10.5)
nRBC: 0 % (ref 0.0–0.2)

## 2024-01-26 LAB — COMPREHENSIVE METABOLIC PANEL WITH GFR
ALT: 12 U/L (ref 0–44)
AST: 25 U/L (ref 15–41)
Albumin: 2.6 g/dL — ABNORMAL LOW (ref 3.5–5.0)
Alkaline Phosphatase: 98 U/L (ref 38–126)
Anion gap: 16 — ABNORMAL HIGH (ref 5–15)
BUN: 30 mg/dL — ABNORMAL HIGH (ref 8–23)
CO2: 24 mmol/L (ref 22–32)
Calcium: 7.4 mg/dL — ABNORMAL LOW (ref 8.9–10.3)
Chloride: 89 mmol/L — ABNORMAL LOW (ref 98–111)
Creatinine, Ser: 3.71 mg/dL — ABNORMAL HIGH (ref 0.44–1.00)
GFR, Estimated: 13 mL/min — ABNORMAL LOW (ref >60)
Glucose, Bld: 117 mg/dL — ABNORMAL HIGH (ref 70–99)
Potassium: 4.1 mmol/L (ref 3.5–5.1)
Sodium: 129 mmol/L — ABNORMAL LOW (ref 135–145)
Total Bilirubin: 0.5 mg/dL (ref 0.0–1.2)
Total Protein: 6.5 g/dL (ref 6.5–8.1)

## 2024-01-26 LAB — HEPATITIS B SURFACE ANTIGEN: Hepatitis B Surface Ag: NONREACTIVE

## 2024-01-26 LAB — HIV ANTIBODY (ROUTINE TESTING W REFLEX): HIV Screen 4th Generation wRfx: NONREACTIVE

## 2024-01-26 MED ORDER — ACETAMINOPHEN 325 MG PO TABS
ORAL_TABLET | ORAL | Status: AC
  Filled 2024-01-26: qty 2

## 2024-01-26 MED ORDER — SENNOSIDES-DOCUSATE SODIUM 8.6-50 MG PO TABS
2.0000 | ORAL_TABLET | Freq: Two times a day (BID) | ORAL | Status: DC
  Administered 2024-01-26 – 2024-01-30 (×6): 2 via ORAL
  Filled 2024-01-26 (×7): qty 2

## 2024-01-26 MED ORDER — HYDROMORPHONE HCL 1 MG/ML IJ SOLN
0.5000 mg | INTRAMUSCULAR | Status: DC | PRN
  Administered 2024-01-26: 0.5 mg via INTRAVENOUS
  Filled 2024-01-26: qty 1
  Filled 2024-01-26: qty 0.5

## 2024-01-26 MED ORDER — POLYETHYLENE GLYCOL 3350 17 G PO PACK: 17.0000 g | PACK | Freq: Every day | ORAL | Status: DC | PRN

## 2024-01-26 MED ORDER — NICOTINE 21 MG/24HR TD PT24
21.0000 mg | MEDICATED_PATCH | Freq: Every day | TRANSDERMAL | Status: DC
  Administered 2024-01-26 – 2024-01-30 (×4): 21 mg via TRANSDERMAL
  Filled 2024-01-26 (×4): qty 1

## 2024-01-26 MED ORDER — CHLORHEXIDINE GLUCONATE CLOTH 2 % EX PADS
6.0000 | MEDICATED_PAD | Freq: Every day | CUTANEOUS | Status: DC
  Administered 2024-01-27 – 2024-01-28 (×2): 6 via TOPICAL

## 2024-01-26 NOTE — Progress Notes (Signed)
 [facility] PROGRESS NOTE   [name] FMW:[id number] DOB: June 15, 1960 DOA: 01/25/2024  PCP: Pcp, No  Brief History: 63 y.o. female with medical history significant of ESRD on HD MWF with history of dialysis noncompliance, homelessness, anemia of chronic disease, COPD, HFpEF (echo 10/23/2023 with EF 60 to 65%, grade 2 diastolic dysfunction, mild mitral regurgitation), hypertension, hyperlipidemia, PAD, peripheral neuropathy, chronic pain syndrome, polysubstance abuse (cocaine, marijuana, alcohol , tobacco), anxiety, depression, GERD.  Recent hospital admission 10/14-10/31/2025 for severe constipation and ileus, cirrhosis with ascites requiring paracentesis, worsening anemia requiring blood transfusions, decompensated CHF, and UTI.  Patient presented with complaints of shortness of breath.  She had not gone for any of her outpatient dialysis session since last hospitalization due to transportation issues.  Noted to be fluid overloaded.  Was hospitalized for further management.    Consultants: Nephrology  Procedures: Hemodialysis    Subjective/Interval History: Patient mentioned that she is feeling better compared to last night.  Denies any chest pain.  No abdominal pain.  No nausea or vomiting.    Assessment/Plan:  Acute respiratory failure with hypoxia/volume overload Initially treated with BiPAP.  After dialysis she has been weaned downed to oxygen . Continue to monitor closely.  End-stage renal disease on hemodialysis/noncompliance with dialysis/hyponatremia/hyperkalemia Noncompliance is mainly due to homelessness and transportation issues. Noted to have hyponatremia and hyperkalemia.  She was dialyzed urgently overnight.  Labs are pending from this morning.  Chest pain Likely due to volume overload.  Troponin was noted to be normal.  Did not suspect ACS at this time.  Hepatic cirrhosis with ascites Small to moderate ascites noted on ultrasound.  Abdomen is benign.  No  clear indication for paracentesis at this time.  There is a component of volume overload causing ascites as well.  Hopefully this will improve with hemodialysis.  History of alcohol  abuse Patient reports not drinking alcohol  over the last 1 month.  Monitor closely.  History of COPD Stable.  Essential hypertension Stable.  Noted to be on amlodipine  and carvedilol .  Normocytic anemia Likely anemia of chronic kidney disease.  No evidence of overt bleeding.  Homelessness/transportation issues TOC consulted.  Patient tells me that she was on a waiting list for a woman shelter.  TOC can follow-up on this.  DVT Prophylaxis: SCDs for now Code Status: DNR Family Communication: Discussed with patient Disposition Plan: She is homeless  Status is: Inpatient Remains inpatient appropriate because: Fluid overload, need for further dialysis    Medications: Scheduled:  amLODipine   10 mg Oral Daily   atorvastatin   10 mg Oral Daily   budesonide -glycopyrrolate -formoterol   2 puff Inhalation BID   calcitRIOL   0.25 mcg Oral Daily   carvedilol   6.25 mg Oral BID WC   Chlorhexidine  Gluconate Cloth  6 each Topical Q0600   gabapentin   100 mg Oral BID   nicotine   21 mg Transdermal Daily   pantoprazole   40 mg Oral BID   sodium bicarbonate   1,300 mg Oral BID   traZODone   50 mg Oral QHS   Continuous: PRN:acetaminophen  **OR** acetaminophen , albuterol , ALPRAZolam , naLOXone  (NARCAN )  injection, oxyCODONE   Antibiotics: Anti-infectives (From admission, onward)    None       Objective:  Vital Signs  Vitals:   01/26/24 0246 01/26/24 0255 01/26/24 0435 01/26/24 0725  BP: 131/77  139/68 (!) 149/67  Pulse: 72  80 66  Resp: 14  (!) 25 16  Temp: 98.2 F (36.8 C)  97.6 F (36.4 C) 98.3 F (36.8 C)  TempSrc: Temporal  Temporal Axillary  SpO2: 91% 92% 92% 94%  Weight: 58.7 kg     Height: 5' 7 (1.702 m)       Intake/Output Summary (Last 24 hours) at 01/26/2024 0921 Last data filed at  01/26/2024 0445 Gross per 24 hour  Intake 120 ml  Output 4500 ml  Net -4380 ml   Filed Weights   01/25/24 1740 01/26/24 0246  Weight: 52.2 kg 58.7 kg    General appearance: Awake alert.  In no distress Resp: Clear to auscultation bilaterally.  Normal effort Cardio: S1-S2 is normal regular.  No S3-S4.  No rubs murmurs or bruit GI: Abdomen is soft.  Nontender nondistended.  Bowel sounds are present normal.  No masses organomegaly Extremities: No edema.  Full range of motion of lower extremities. Neurologic: Alert and oriented x3.  No focal neurological deficits.    Lab Results:  Data Reviewed: I have personally reviewed following labs and reports of the imaging studies  CBC: Recent Labs  Lab 01/25/24 1743 01/25/24 1754  WBC 7.9  --   NEUTROABS 6.1  --   HGB 8.2* 8.8*  HCT 26.4* 26.0*  MCV 99.6  --   PLT 472*  --     Basic Metabolic Panel: Recent Labs  Lab 01/25/24 1743 01/25/24 1754  NA 125* 122*  K 7.0* 6.6*  CL 89* 94*  CO2 17*  --   GLUCOSE 100* 105*  BUN 71* 79*  CREATININE 6.18* 6.90*  CALCIUM  7.1*  --     GFR: Estimated Creatinine Clearance: 7.7 mL/min (A) (by C-G formula based on SCr of 6.9 mg/dL (H)).   CBG: Recent Labs  Lab 01/25/24 2009  GLUCAP 88    Radiology Studies: US  ASCITES (ABDOMEN LIMITED) Result Date: 01/26/2024 CLINICAL DATA:  Ascites EXAM: LIMITED ABDOMEN ULTRASOUND FOR ASCITES TECHNIQUE: Limited ultrasound survey for ascites was performed in all four abdominal quadrants. COMPARISON:  CT scan 01/06/2024 FINDINGS: Small to moderate volume ascites identified in all 4 quadrants. IMPRESSION: Small to moderate volume ascites. Electronically Signed   By: Camellia Candle M.D.   On: 01/26/2024 05:55   DG Chest Port 1 View Result Date: 01/25/2024 EXAM: 1 VIEW(S) XRAY OF THE CHEST 01/25/2024 06:47:00 PM COMPARISON: 12/30/2023 CLINICAL HISTORY: SOB FINDINGS: LUNGS AND PLEURA: New moderate left pleural effusion. Stable small right pleural  effusion. Bibasilar consolidation/atelectasis, left greater than right. Mild pulmonary edema. No pneumothorax. HEART AND MEDIASTINUM: Stable cardiomegaly. Aortic atherosclerosis (ICD10-170.0). BONES AND SOFT TISSUES: No acute osseous abnormality. IMPRESSION: 1. Cardiomegaly with basilar predominant edema and new moderate left pleural effusion. Stable small right pleural effusion. Electronically signed by: Norman Gatlin MD 01/25/2024 07:09 PM EST RP Workstation: [id number]       LOS: 1 day   Joette Pebbles  [facility] Pager on [email or web address]  01/26/2024, 9:21 AM

## 2024-01-26 NOTE — Plan of Care (Signed)

## 2024-01-26 NOTE — Progress Notes (Signed)
 [facility] Progress Note  Subjective:  Got 4.5 L off w/ HD yest Feeling much better  Vitals:   01/26/24 0435 01/26/24 0725 01/26/24 1156 01/26/24 1542  BP: 139/68 (!) 149/67 120/69 135/78  Pulse: 80 66 74 84  Resp: (!) 25 16 14 14   Temp: 97.6 F (36.4 C) 98.3 F (36.8 C) 98.1 F (36.7 C) 98 F (36.7 C)  TempSrc: Temporal Axillary Oral Oral  SpO2: 92% 94% 100% 98%  Weight:      Height:        Exam: Gen alert, [place] O2 Chest clear bilat to bases RRR no MRG Abd soft ntnd, 1-2+ ascites +bs Ext minimal thick edema of the LE's Neuro is alert, Ox 3 , nf    RUA AVF+bruit    Home bp meds: Norvasc  10 every day Coreg  6.25 bid  Torsemide  100mg  every day      OP HD: does not have a HD unit Has RUA AVF now  From aug 2024 --> 3h  B350 Heparin  none R AVF   CXR 11/09 - IMPRESSION: Cardiomegaly with basilar predominant edema and new moderate left pleural effusion. Stable small right pleural effusion.    Assessment/ Plan: SOB/ vol overload: diffuse body edema (tight) and SOB requiring bipap in ED. Had HD overnight w/ 4.5 L off. Next HD tomorrow w/ max UF.  ESRD: does not have a HD unit. Comes to hospital for HD. HD tomorrow.  HTN: getting po norvasc  and coreg . Monitor.  Volume: improved clinically, on exam. Max UF next HD.  Anemia of esrd: Hb 8-9 range. Follow.  Homelessness: not sure current status     Myer Fret MD  [facility] 01/26/2024, 4:04 PM  Recent Labs  Lab 01/25/24 1743 01/25/24 1754 01/26/24 1051  HGB 8.2* 8.8* 8.2*  CALCIUM  7.1*  --   --   CREATININE 6.18* 6.90*  --   K 7.0* 6.6*  --    No results for input(s): IRON , TIBC, FERRITIN in the last 168 hours. Inpatient medications:  amLODipine   10 mg Oral Daily   atorvastatin   10 mg Oral Daily   budesonide -glycopyrrolate -formoterol   2 puff Inhalation BID   calcitRIOL   0.25 mcg Oral Daily   carvedilol   6.25 mg Oral BID WC   Chlorhexidine  Gluconate Cloth  6 each Topical Q0600    gabapentin   100 mg Oral BID   nicotine   21 mg Transdermal Daily   pantoprazole   40 mg Oral BID   senna-docusate  2 tablet Oral BID   sodium bicarbonate   1,300 mg Oral BID   traZODone   50 mg Oral QHS    acetaminophen  **OR** acetaminophen , albuterol , ALPRAZolam , HYDROmorphone  (DILAUDID ) injection, naLOXone  (NARCAN )  injection, oxyCODONE , polyethylene glycol

## 2024-01-26 NOTE — TOC CM/SW Note (Signed)
 Transition of Care [facility]) - Inpatient Brief Assessment   Patient Details  Name: IRIDIANA FONNER MRN: [id number] Date of Birth: 09/02/1960  Transition of Care [facility]) CM/SW Contact:    Waddell Barnie Rama, RN Phone Number: 01/26/2024, 4:52 PM   Clinical Narrative: Homeless, has PCP and insurance on file, states has no HH services in place at this time or DME at home.  States  will need  transport assistance home at dc , states gets medications from [facility] on [address]  Pta self ambulatory.  CSW provided resources.    Transition of Care Asessment: Insurance and Status: Insurance coverage has been reviewed Patient has primary care physician: Yes Home environment has been reviewed: Homeless Prior level of function:: indep Prior/Current Home Services: No current home services Social Drivers of Health Review: SDOH reviewed interventions complete Readmission risk has been reviewed: Yes Transition of care needs: transition of care needs identified, TOC will continue to follow

## 2024-01-26 NOTE — Progress Notes (Signed)
   01/26/24 0222  Vitals  Temp 98.2 F (36.8 C)  BP (!) 102/50  Pulse Rate (!) 57  Resp 12  Oxygen  Therapy  SpO2 98 %  Patient Activity (if Appropriate) In bed  Pulse Oximetry Type Continuous  Oximetry Probe Site Changed No  Post Treatment  Dialyzer Clearance Clear  Liters Processed 72  Fluid Removed (mL) 4500 mL  Tolerated HD Treatment Yes  AVG/AVF Arterial Site Held (minutes) 5 minutes  AVG/AVF Venous Site Held (minutes) 5 minutes   Pt tolerated tx well, only adverse event to occur was slight clotting resulting in increased TMP and the need to change out tubing. Pt VSS, just slighty agitated and seeking medication.

## 2024-01-26 NOTE — Progress Notes (Signed)
 CSW addressed patients SDOH needs and provided resources on patients AVS.  Luise Pan, MSW, LCSWA Transitions of Care [phone]

## 2024-01-26 NOTE — Progress Notes (Signed)
 PT placed on BIPAP at this time.   01/26/24 2220  BiPAP/CPAP/SIPAP  BiPAP/CPAP/SIPAP Pt Type Adult  BiPAP/CPAP/SIPAP SERVO  Mask Type Full face mask  Mask Size Medium  Respiratory Rate 30 breaths/min  IPAP 12 cmH20  EPAP 5 cmH2O  Pressure Support 7 cmH20  PEEP 5 cmH20  FiO2 (%) 60 %  Minute Ventilation 17.1  Leak 31  Peak Inspiratory Pressure (PIP) 13  Tidal Volume (Vt) 398  Patient Home Machine No  Patient Home Mask No  Patient Home Tubing No  Auto Titrate No  Press High Alarm 30 cmH2O  Press Low Alarm 5 cmH2O  Nasal massage performed Yes  CPAP/SIPAP surface wiped down Yes  Device Plugged into RED Power Outlet Yes

## 2024-01-26 NOTE — Progress Notes (Signed)
 Report received from Jordan at [place]. States 4.5L of fluid taken off. Transport requested. Awaiting patient's arrival to [facility].

## 2024-01-26 NOTE — Progress Notes (Signed)
 Patient transported  from Dialysis  to the floor 3[facility] w/o incident.

## 2024-01-27 LAB — IRON AND TIBC
Iron: 44 ug/dL (ref 28–170)
Saturation Ratios: 15 % (ref 10.4–31.8)
TIBC: 295 ug/dL (ref 250–450)
UIBC: 251 ug/dL

## 2024-01-27 LAB — CBC
HCT: 27.4 % — ABNORMAL LOW (ref 36.0–46.0)
Hemoglobin: 8.7 g/dL — ABNORMAL LOW (ref 12.0–15.0)
MCH: 30.6 pg (ref 26.0–34.0)
MCHC: 31.8 g/dL (ref 30.0–36.0)
MCV: 96.5 fL (ref 80.0–100.0)
Platelets: 452 K/uL — ABNORMAL HIGH (ref 150–400)
RBC: 2.84 MIL/uL — ABNORMAL LOW (ref 3.87–5.11)
RDW: 17.1 % — ABNORMAL HIGH (ref 11.5–15.5)
WBC: 9.6 K/uL (ref 4.0–10.5)
nRBC: 0 % (ref 0.0–0.2)

## 2024-01-27 LAB — BASIC METABOLIC PANEL WITH GFR
Anion gap: 12 (ref 5–15)
BUN: 32 mg/dL — ABNORMAL HIGH (ref 8–23)
CO2: 26 mmol/L (ref 22–32)
Calcium: 7.5 mg/dL — ABNORMAL LOW (ref 8.9–10.3)
Chloride: 87 mmol/L — ABNORMAL LOW (ref 98–111)
Creatinine, Ser: 4.26 mg/dL — ABNORMAL HIGH (ref 0.44–1.00)
GFR, Estimated: 11 mL/min — ABNORMAL LOW (ref >60)
Glucose, Bld: 91 mg/dL (ref 70–99)
Potassium: 4.4 mmol/L (ref 3.5–5.1)
Sodium: 125 mmol/L — ABNORMAL LOW (ref 135–145)

## 2024-01-27 LAB — HEPATITIS B SURFACE ANTIBODY, QUANTITATIVE: Hep B S AB Quant (Post): <3.5 m[IU]/mL — ABNORMAL LOW

## 2024-01-27 LAB — FERRITIN: Ferritin: 322 ng/mL — ABNORMAL HIGH (ref 11–307)

## 2024-01-27 MED ORDER — HEPARIN SODIUM (PORCINE) 1000 UNIT/ML DIALYSIS: 40.0000 [IU]/kg | INTRAMUSCULAR | Status: DC | PRN

## 2024-01-27 MED ORDER — PENTAFLUOROPROP-TETRAFLUOROETH EX AERO
1.0000 | INHALATION_SPRAY | CUTANEOUS | Status: DC | PRN
Start: 2024-01-27 — End: 2024-01-27

## 2024-01-27 MED ORDER — ALTEPLASE 2 MG IJ SOLR: 2.0000 mg | Freq: Once | INTRAMUSCULAR | Status: DC | PRN

## 2024-01-27 MED ORDER — HEPARIN SODIUM (PORCINE) 1000 UNIT/ML DIALYSIS
2000.0000 [IU] | Freq: Once | INTRAMUSCULAR | Status: AC
  Administered 2024-01-27: 2000 [IU] via INTRAVENOUS_CENTRAL

## 2024-01-27 MED ORDER — LIDOCAINE HCL (PF) 1 % IJ SOLN: 5.0000 mL | INTRAMUSCULAR | Status: DC | PRN

## 2024-01-27 MED ORDER — ANTICOAGULANT SODIUM CITRATE 4% (200MG/5ML) IV SOLN: 5.0000 mL | Status: DC | PRN

## 2024-01-27 MED ORDER — LIDOCAINE-PRILOCAINE 2.5-2.5 % EX CREA
1.0000 | TOPICAL_CREAM | CUTANEOUS | Status: DC | PRN
Start: 2024-01-27 — End: 2024-01-27

## 2024-01-27 MED ORDER — ALPRAZOLAM 0.5 MG PO TABS
0.2500 mg | ORAL_TABLET | Freq: Two times a day (BID) | ORAL | Status: DC | PRN
  Administered 2024-01-27 – 2024-01-29 (×4): 0.25 mg via ORAL
  Filled 2024-01-27 (×4): qty 1

## 2024-01-27 MED ORDER — NEPRO/CARBSTEADY PO LIQD: 237.0000 mL | ORAL | Status: DC | PRN

## 2024-01-27 MED ORDER — HYDROCOD POLI-CHLORPHE POLI ER 10-8 MG/5ML PO SUER
5.0000 mL | Freq: Two times a day (BID) | ORAL | Status: DC | PRN
  Administered 2024-01-27 – 2024-01-29 (×2): 5 mL via ORAL
  Filled 2024-01-27 (×2): qty 5

## 2024-01-27 MED ORDER — HEPARIN SODIUM (PORCINE) 1000 UNIT/ML DIALYSIS: 1000.0000 [IU] | INTRAMUSCULAR | Status: DC | PRN

## 2024-01-27 MED ORDER — PRAMIPEXOLE DIHYDROCHLORIDE 0.25 MG PO TABS
0.5000 mg | ORAL_TABLET | Freq: Every day | ORAL | Status: DC
  Administered 2024-01-27 – 2024-01-29 (×3): 0.5 mg via ORAL
  Filled 2024-01-27 (×4): qty 2

## 2024-01-27 MED ORDER — GUAIFENESIN ER 600 MG PO TB12
600.0000 mg | ORAL_TABLET | Freq: Two times a day (BID) | ORAL | Status: DC
  Administered 2024-01-27 – 2024-01-30 (×7): 600 mg via ORAL
  Filled 2024-01-27 (×7): qty 1

## 2024-01-27 MED ORDER — LIDOCAINE-PRILOCAINE 2.5-2.5 % EX CREA: 1.0000 | TOPICAL_CREAM | CUTANEOUS | Status: DC | PRN

## 2024-01-27 MED ORDER — PENTAFLUOROPROP-TETRAFLUOROETH EX AERO: 1.0000 | INHALATION_SPRAY | CUTANEOUS | Status: DC | PRN

## 2024-01-27 MED ORDER — DARBEPOETIN ALFA 60 MCG/0.3ML IJ SOSY
60.0000 ug | PREFILLED_SYRINGE | INTRAMUSCULAR | Status: DC
  Administered 2024-01-27: 60 ug via SUBCUTANEOUS
  Filled 2024-01-27: qty 0.3

## 2024-01-27 NOTE — Progress Notes (Signed)
   01/27/24 1249  Vitals  Temp 98.6 F (37 C)  Pulse Rate 80  Resp 12  BP (!) 149/65  SpO2 100 %  O2 Device Nasal Cannula  Weight 60.3 kg  Type of Weight Post-Dialysis  Oxygen  Therapy  O2 Flow Rate (L/min) 2 L/min  Post Treatment  Dialyzer Clearance Lightly streaked  Liters Processed 73  Fluid Removed (mL) 4000 mL  Tolerated HD Treatment Yes  AVG/AVF Arterial Site Held (minutes) 5 minutes  AVG/AVF Venous Site Held (minutes) 5 minutes

## 2024-01-27 NOTE — Progress Notes (Signed)
 [facility] Progress Note  Subjective:  Got 4 L off today w/ HD No new c/o's  Vitals:   01/27/24 1130 01/27/24 1200 01/27/24 1249 01/27/24 1342  BP: (!) 142/62 (!) 157/74 (!) 149/65 (!) 154/75  Pulse: 68 93 80 93  Resp: 12 15 12 18   Temp:   98.6 F (37 C) 99.4 F (37.4 C)  TempSrc:   Oral Oral  SpO2: 100% 99% 100% 99%  Weight:   60.3 kg   Height:        Exam: Gen alert, [place] O2 2L  Chest clear bilat to bases RRR no MRG Abd soft ntnd, 1-2+ ascites +bs Ext minimal thickened edema of the LE's Neuro is alert, Ox 3 , nf    RUA AVF+bruit    Home bp meds: Norvasc  10 every day Coreg  6.25 bid  Torsemide  100mg  every day      OP HD: does not have a HD unit Has RUA AVF now  From aug 2024 --> 3h  B350 Heparin  none R AVF   CXR 11/09 - IMPRESSION: Cardiomegaly with basilar predominant edema and new moderate left pleural effusion. Stable small right pleural effusion.    Assessment/ Plan: SOB/ vol overload: diffuse body edema (tight) and SOB requiring bipap in ED. Had HD Sunday night w/ 4.5 L off.  HD today w/ 4 L off.  ESRD: she does not have a HD unit. Comes to hospital for HD. HD today, cont TTS this week.  HTN: getting po norvasc  and coreg . Monitor.  Volume: improved clinically, on exam. Max UF next HD.  Anemia of esrd: Hb 8-9 range. Will start esa w/ darbe 60mcg weekly. Last tsat 30% in mid October, will repeat.  Homelessness: not sure current status     Myer Fret MD  [facility] 01/27/2024, 1:50 PM  Recent Labs  Lab 01/26/24 1051 01/27/24 0256  HGB 8.2* 8.7*  ALBUMIN  2.6*  --   CALCIUM  7.4* 7.5*  CREATININE 3.71* 4.26*  K 4.1 4.4   No results for input(s): IRON , TIBC, FERRITIN in the last 168 hours. Inpatient medications:  amLODipine   10 mg Oral Daily   atorvastatin   10 mg Oral Daily   budesonide -glycopyrrolate -formoterol   2 puff Inhalation BID   calcitRIOL   0.25 mcg Oral Daily   carvedilol   6.25 mg Oral BID WC   Chlorhexidine  Gluconate  Cloth  6 each Topical Q0600   Chlorhexidine  Gluconate Cloth  6 each Topical Q0600   gabapentin   100 mg Oral BID   guaiFENesin   600 mg Oral BID   nicotine   21 mg Transdermal Daily   pantoprazole   40 mg Oral BID   senna-docusate  2 tablet Oral BID   sodium bicarbonate   1,300 mg Oral BID   traZODone   50 mg Oral QHS    acetaminophen  **OR** acetaminophen , albuterol , ALPRAZolam , chlorpheniramine-HYDROcodone , HYDROmorphone  (DILAUDID ) injection, naLOXone  (NARCAN )  injection, oxyCODONE , polyethylene glycol

## 2024-01-27 NOTE — Progress Notes (Signed)
 [facility] PROGRESS NOTE   [name] FMW:[id number] DOB: 07-Nov-1960 DOA: 01/25/2024  PCP: Pcp, No  Brief History: 63 y.o. female with medical history significant of ESRD on HD MWF with history of dialysis noncompliance, homelessness, anemia of chronic disease, COPD, HFpEF (echo 10/23/2023 with EF 60 to 65%, grade 2 diastolic dysfunction, mild mitral regurgitation), hypertension, hyperlipidemia, PAD, peripheral neuropathy, chronic pain syndrome, polysubstance abuse (cocaine, marijuana, alcohol , tobacco), anxiety, depression, GERD.  Recent hospital admission 10/14-10/31/2025 for severe constipation and ileus, cirrhosis with ascites requiring paracentesis, worsening anemia requiring blood transfusions, decompensated CHF, and UTI.  Patient presented with complaints of shortness of breath.  She had not gone for any of her outpatient dialysis session since last hospitalization due to transportation issues.  Noted to be fluid overloaded.  Was hospitalized for further management.    Consultants: Nephrology  Procedures: Hemodialysis  Subjective/Interval History: Complains of leg pain which is chronic for her.  Complains of anxiety.  Shortness of breath is improved.  She is going for another session of dialysis today.     Assessment/Plan:  Acute respiratory failure with hypoxia/volume overload Respiratory failure due to pulmonary edema from having missed dialysis sessions.  Initially treated with BiPAP.  After dialysis she has been weaned downed to oxygen . Required BiPAP last night.  Patient to go for another dialysis session today.  Hopefully she can be weaned off of oxygen  by the end of this hospital stay. Continue to monitor closely.  End-stage renal disease on hemodialysis/noncompliance with dialysis/hyponatremia/hyperkalemia Noncompliance is mainly due to homelessness and transportation issues. Noted to have hyponatremia and hyperkalemia.  She underwent urgent dialysis on the night  of admission.  Electrolytes show improvement though she still has hyponatremia. Another session of dialysis today. Nephrology is following.  It appears that she gets her regular dialysis in the hospital and not at her dialysis center but she did not show up for that.    Chest pain Likely due to volume overload.  Troponin was noted to be normal.  Do not suspect ACS at this time.  Hepatic cirrhosis with ascites Small to moderate ascites noted on ultrasound.  Abdomen is benign.  No clear indication for paracentesis at this time.  There is a component of volume overload causing ascites as well.  Hopefully this will improve with hemodialysis.  Chronic pain in the lower extremity secondary to peripheral artery disease No evidence for acute ischemia.  She does have some vascular ulcers.  She is on narcotics.  She is on gabapentin .  There is some evidence for drug-seeking behavior as well.  History of alcohol  abuse Patient reports not drinking alcohol  over the last 1 month.  Monitor closely.  History of COPD Stable.  Essential hypertension Stable.  Noted to be on amlodipine  and carvedilol .  Normocytic anemia Likely anemia of chronic kidney disease.  No evidence of overt bleeding.  Homelessness/transportation issues TOC consulted.  Patient tells me that she was on a waiting list for a woman shelter.  TOC can follow-up on this.  DVT Prophylaxis: SCDs for now Code Status: DNR Family Communication: Discussed with patient Disposition Plan: She is homeless   Medications: Scheduled:  amLODipine   10 mg Oral Daily   atorvastatin   10 mg Oral Daily   budesonide -glycopyrrolate -formoterol   2 puff Inhalation BID   calcitRIOL   0.25 mcg Oral Daily   carvedilol   6.25 mg Oral BID WC   Chlorhexidine  Gluconate Cloth  6 each Topical Q0600   Chlorhexidine  Gluconate Cloth  6 each Topical  [id number]   gabapentin   100 mg Oral BID   guaiFENesin   600 mg Oral BID   heparin   2,000 Units Dialysis Once in dialysis    nicotine   21 mg Transdermal Daily   pantoprazole   40 mg Oral BID   senna-docusate  2 tablet Oral BID   sodium bicarbonate   1,300 mg Oral BID   traZODone   50 mg Oral QHS   Continuous:  anticoagulant sodium citrate      PRN:acetaminophen  **OR** acetaminophen , albuterol , ALPRAZolam , alteplase , anticoagulant sodium citrate , chlorpheniramine-HYDROcodone , feeding supplement (NEPRO CARB STEADY), heparin , HYDROmorphone  (DILAUDID ) injection, lidocaine  (PF), lidocaine -prilocaine , naLOXone  (NARCAN )  injection, oxyCODONE , pentafluoroprop-tetrafluoroeth, polyethylene glycol  Antibiotics: Anti-infectives (From admission, onward)    None       Objective:  Vital Signs  Vitals:   01/27/24 0854 01/27/24 0900 01/27/24 0915 01/27/24 0930  BP: 138/69 138/69  135/71  Pulse: 81 83 (!) 142 64  Resp: 18 (!) 22 14 15   Temp: (!) 97.5 F (36.4 C)     TempSrc:      SpO2: 98% (!) 83% 99% (!) 81%  Weight: 63.5 kg     Height:        Intake/Output Summary (Last 24 hours) at 01/27/2024 0949 Last data filed at 01/27/2024 0300 Gross per 24 hour  Intake 1440 ml  Output --  Net 1440 ml   Filed Weights   01/26/24 0246 01/27/24 0543 01/27/24 0854  Weight: 58.7 kg 60.2 kg 63.5 kg    General appearance: Awake alert.  In no distress Resp: Normal effort at rest.  Improved from yesterday.  Still has crackles at the bases.  No wheezing or rhonchi. Cardio: S1-S2 is normal regular.  No S3-S4.  No rubs murmurs or bruit GI: Abdomen is soft.  Nontender nondistended.  Bowel sounds are present normal.  No masses organomegaly Extremities: No significant edema lower extremities.  She does have vascular ulcers.  No evidence for acute ischemia.  Pulses are present in bilateral feet though they are faint.  Lab Results:  Data Reviewed: I have personally reviewed following labs and reports of the imaging studies  CBC: Recent Labs  Lab 01/25/24 1743 01/25/24 1754 01/26/24 1051 01/27/24 0256  WBC 7.9  --  9.5  9.6  NEUTROABS 6.1  --   --   --   HGB 8.2* 8.8* 8.2* 8.7*  HCT 26.4* 26.0* 25.4* 27.4*  MCV 99.6  --  95.1 96.5  PLT 472*  --  451* 452*    Basic Metabolic Panel: Recent Labs  Lab 01/25/24 1743 01/25/24 1754 01/26/24 1051 01/27/24 0256  NA 125* 122* 129* 125*  K 7.0* 6.6* 4.1 4.4  CL 89* 94* 89* 87*  CO2 17*  --  24 26  GLUCOSE 100* 105* 117* 91  BUN 71* 79* 30* 32*  CREATININE 6.18* 6.90* 3.71* 4.26*  CALCIUM  7.1*  --  7.4* 7.5*    GFR: Estimated Creatinine Clearance: 13.1 mL/min (A) (by C-G formula based on SCr of 4.26 mg/dL (H)).   CBG: Recent Labs  Lab 01/25/24 2009  GLUCAP 88    Radiology Studies: US  ASCITES (ABDOMEN LIMITED) Result Date: 01/26/2024 CLINICAL DATA:  Ascites EXAM: LIMITED ABDOMEN ULTRASOUND FOR ASCITES TECHNIQUE: Limited ultrasound survey for ascites was performed in all four abdominal quadrants. COMPARISON:  CT scan 01/06/2024 FINDINGS: Small to moderate volume ascites identified in all 4 quadrants. IMPRESSION: Small to moderate volume ascites. Electronically Signed   By: Camellia Candle M.D.   On: 01/26/2024 05:55   DG  Chest Port 1 View Result Date: 01/25/2024 EXAM: 1 VIEW(S) XRAY OF THE CHEST 01/25/2024 06:47:00 PM COMPARISON: 12/30/2023 CLINICAL HISTORY: SOB FINDINGS: LUNGS AND PLEURA: New moderate left pleural effusion. Stable small right pleural effusion. Bibasilar consolidation/atelectasis, left greater than right. Mild pulmonary edema. No pneumothorax. HEART AND MEDIASTINUM: Stable cardiomegaly. Aortic atherosclerosis (ICD10-170.0). BONES AND SOFT TISSUES: No acute osseous abnormality. IMPRESSION: 1. Cardiomegaly with basilar predominant edema and new moderate left pleural effusion. Stable small right pleural effusion. Electronically signed by: Norman Gatlin MD 01/25/2024 07:09 PM EST RP Workstation: [id number]       LOS: 2 days   Joette Pebbles  [facility] Pager on [email or web address]  01/27/2024, 9:49 AM

## 2024-01-27 NOTE — Progress Notes (Incomplete)
 [id number]

## 2024-01-28 DIAGNOSIS — F32A Depression, unspecified: Secondary | ICD-10-CM

## 2024-01-28 DIAGNOSIS — F419 Anxiety disorder, unspecified: Secondary | ICD-10-CM

## 2024-01-28 DIAGNOSIS — J441 Chronic obstructive pulmonary disease with (acute) exacerbation: Secondary | ICD-10-CM

## 2024-01-28 DIAGNOSIS — L89112 Pressure ulcer of right upper back, stage 2: Secondary | ICD-10-CM

## 2024-01-28 DIAGNOSIS — F1910 Other psychoactive substance abuse, uncomplicated: Secondary | ICD-10-CM

## 2024-01-28 DIAGNOSIS — I10 Essential (primary) hypertension: Secondary | ICD-10-CM

## 2024-01-28 DIAGNOSIS — E785 Hyperlipidemia, unspecified: Secondary | ICD-10-CM

## 2024-01-28 LAB — BASIC METABOLIC PANEL WITH GFR
Anion gap: 15 (ref 5–15)
BUN: 19 mg/dL (ref 8–23)
CO2: 25 mmol/L (ref 22–32)
Calcium: 7.4 mg/dL — ABNORMAL LOW (ref 8.9–10.3)
Chloride: 89 mmol/L — ABNORMAL LOW (ref 98–111)
Creatinine, Ser: 2.99 mg/dL — ABNORMAL HIGH (ref 0.44–1.00)
GFR, Estimated: 17 mL/min — ABNORMAL LOW (ref >60)
Glucose, Bld: 109 mg/dL — ABNORMAL HIGH (ref 70–99)
Potassium: 4 mmol/L (ref 3.5–5.1)
Sodium: 129 mmol/L — ABNORMAL LOW (ref 135–145)

## 2024-01-28 MED ORDER — CHLORHEXIDINE GLUCONATE CLOTH 2 % EX PADS
6.0000 | MEDICATED_PAD | Freq: Every day | CUTANEOUS | Status: DC
Start: 2024-01-28 — End: 2024-01-30
  Administered 2024-01-29: 6 via TOPICAL

## 2024-01-28 MED ORDER — HEPARIN SODIUM (PORCINE) 1000 UNIT/ML DIALYSIS: 1000.0000 [IU] | INTRAMUSCULAR | Status: DC | PRN

## 2024-01-28 MED ORDER — SALINE SPRAY 0.65 % NA SOLN
1.0000 | NASAL | Status: DC | PRN
  Administered 2024-01-28: 1 via NASAL
  Filled 2024-01-28: qty 44

## 2024-01-28 MED ORDER — ALTEPLASE 2 MG IJ SOLR: 2.0000 mg | Freq: Once | INTRAMUSCULAR | Status: DC | PRN

## 2024-01-28 MED ORDER — ANTICOAGULANT SODIUM CITRATE 4% (200MG/5ML) IV SOLN: 5.0000 mL | Status: DC | PRN

## 2024-01-28 MED ORDER — SODIUM CHLORIDE 0.9 % IV SOLN
300.0000 mg | INTRAVENOUS | Status: AC
  Administered 2024-01-28 – 2024-01-29 (×2): 300 mg via INTRAVENOUS
  Filled 2024-01-28 (×2): qty 15

## 2024-01-28 MED ORDER — LIDOCAINE-PRILOCAINE 2.5-2.5 % EX CREA
1.0000 | TOPICAL_CREAM | CUTANEOUS | Status: DC | PRN
Start: 2024-01-28 — End: 2024-01-29

## 2024-01-28 MED ORDER — NEPRO/CARBSTEADY PO LIQD: 237.0000 mL | ORAL | Status: DC | PRN

## 2024-01-28 MED ORDER — PENTAFLUOROPROP-TETRAFLUOROETH EX AERO
1.0000 | INHALATION_SPRAY | CUTANEOUS | Status: DC | PRN
Start: 2024-01-28 — End: 2024-01-29

## 2024-01-28 MED ORDER — IRON SUCROSE 300 MG IVPB - SIMPLE MED
300.0000 mg | Status: DC
  Filled 2024-01-28: qty 265

## 2024-01-28 MED ORDER — LIDOCAINE HCL (PF) 1 % IJ SOLN: 5.0000 mL | INTRAMUSCULAR | Status: DC | PRN

## 2024-01-28 NOTE — Assessment & Plan Note (Signed)
 No signs of acute exacerbation, continue with inhaled corticosteroids and bronchodilator therapy  Smoking cessation.

## 2024-01-28 NOTE — Assessment & Plan Note (Signed)
 Continue with atorvastatin

## 2024-01-28 NOTE — Assessment & Plan Note (Signed)
 Follow up as outpatient  Patient tested positive for cocaine 12/30/23

## 2024-01-28 NOTE — Plan of Care (Signed)

## 2024-01-28 NOTE — Progress Notes (Signed)
   01/28/24 1943  BiPAP/CPAP/SIPAP  BiPAP/CPAP/SIPAP Pt Type Adult  Reason BIPAP/CPAP not in use Non-compliant  BiPAP/CPAP /SiPAP Vitals  Pulse Rate 82  Resp 20  SpO2 92 %  Bilateral Breath Sounds Diminished;Expiratory wheezes

## 2024-01-28 NOTE — TOC Progression Note (Addendum)
 Transition of Care [facility]) - Progression Note    Patient Details  Name: [name] MRN: [id number] Date of Birth: 09/28/1960  [facility]) CM/SW Contact  Luise JAYSON Pan, [place] Phone Number: 01/28/2024, 1:17 PM  Clinical Narrative:   CSW spoke with patient about where patient plans to discharge to. Patient states she will go to her ex husbands house to pick up her stuff and then go to a [address] ( [address] [place], [place] [address]) or [facility] ([address] [place], [place], [place] [address]). Patient stated she was informed that [employer] could assist with transportation but patient stated she does not have a phone and is unable to communicate with the agency. Patient stated she currently only has $400 left from her check. Patient stated she was working with CSW on previous unit she was on to be seen at the [facility] by a specific caseworker. CSW informed patient that the case workers at the [facility] are student interns and she would just need to go to the [facility] I to speak with one of them for assistance. Patient expressed her understanding. Patient expressed trying to go [facility] (homeless shelter) but they currently do not have a bed. Patient will need to follow up.   Per Renal Navigator, patient is not set up with a dialysis clinic. CSW unable to set up dialysis patient due to patient not having a clinic or permanent address or a way to communicate with transportation services. Patient inquired about being set up with [facility] for care, as they also have transportation assistance. Per RNCM, patient cannot be established with [facility] due to not having medicare part b.   CSW unable to assist with transportation needs at this time due to patients lack of permanent housing and patient does not have an established HD clinic (please see SW Nephrology note).   Barriers: No HD clinic due to noncompliance, lack of permanent housing, lack of transportation to get to  HD   Expected Discharge Plan:  ([employer]) Barriers to Discharge: Continued Medical Work up, Inadequate or no insurance, Homeless with medical needs  Social Drivers of Health (SDOH) Interventions SDOH Screenings   Food Insecurity: Food Insecurity Present (01/26/2024)  Housing: High Risk (01/26/2024)  Transportation Needs: Unmet Transportation Needs (01/26/2024)  Utilities: At Risk (01/26/2024)  Alcohol  Screen: Low Risk  (02/10/2023)  Depression (PHQ2-9): Medium Risk (06/11/2023)  Financial Resource Strain: High Risk (02/10/2023)  Physical Activity: Inactive (02/10/2023)  Social Connections: Socially Isolated (06/12/2023)  Tobacco Use: High Risk (01/25/2024)  Health Literacy: Adequate Health Literacy (02/10/2023)    Readmission Risk Interventions    01/26/2024    4:49 PM 01/14/2024    9:38 AM 07/01/2023   12:39 PM  Readmission Risk Prevention Plan  Transportation Screening Complete Complete Complete  Medication Review (RN Care Manager) Complete Referral to Pharmacy Referral to Pharmacy  PCP or Specialist appointment within 3-5 days of discharge  Complete Complete  HRI or Home Care Consult Complete Complete Complete  SW Recovery Care/Counseling Consult  Complete Complete  Palliative Care Screening Not Applicable Not Applicable Not Applicable  Skilled Nursing Facility Not Applicable Not Applicable Not Applicable

## 2024-01-28 NOTE — Assessment & Plan Note (Addendum)
 Continue with trazodone , alprazolam  and gapabapentin  As needed alprazolam .  Continue pramipexole  Will resume oxycodone  for bilateral lower extremity pain and neuropathy.

## 2024-01-28 NOTE — Progress Notes (Addendum)
 Pt's out-pt HD situation discussed with attending, nephrologist, CSW, and RN CM. Pt does not currently have an out-pt HD clinic. Pt has not been agreeable to HD at an out-pt HD clinic and pt has also been an area denial for any local clinic in the past due to pt's non-compliance. Should pt be agreeable to out-pt HD being arranged, then referral can be made to see if any clinic would be willing to accept pt. However, pt has not been agreeable up to this point.   Randine Mungo Dialysis Navigator [phone]

## 2024-01-28 NOTE — Care Management Important Message (Signed)
 Important Message  Patient Details  Name: [name] MRN: [id number] Date of Birth: [birth year]   Important Message Given:  Yes - Medicare IM     [name] 01/28/2024, 10:50 AM

## 2024-01-28 NOTE — Assessment & Plan Note (Addendum)
 Acute respiratory failure due to volume overload, pulmonary edema (acute), clinically now resolved.  Patient initially required Bipap, now off NIV Today 02 saturation is 96% on room air.   Pre HD BUN 25, with K at 4.1 and serum bicarbonate at 25 Na 125   Continue volume management with hemodialysis with ultrafiltration   Metabolic bone disease, continue calcitriol  and oral sodium bicarbonate   Anemia of chronic renal disease, continue iron  and EPO

## 2024-01-28 NOTE — Progress Notes (Addendum)
  Progress Note   Patient: [name] FMW:[id number] DOB: [birth year] DOA: 01/25/2024     3 DOS: the patient was seen and examined on 01/28/2024   Brief hospital course: [name] was admitted to the hospital with the working diagnosis of volume overload in the setting of ESRD.   63 y.o. female with medical history significant of ESRD on HD MWF with history of dialysis noncompliance, homelessness, anemia of chronic disease, COPD, HFpEF (echo 10/23/2023 with EF 60 to 65%, grade 2 diastolic dysfunction, mild mitral regurgitation), hypertension, hyperlipidemia, PAD, peripheral neuropathy, chronic pain syndrome, polysubstance abuse (cocaine, marijuana, alcohol , tobacco), anxiety, depression, GERD.  Recent hospital admission 10/14-10/31/2025 for severe constipation and ileus, cirrhosis with ascites requiring paracentesis, worsening anemia requiring blood transfusions, decompensated CHF, and UTI.  Patient presented with complaints of shortness of breath.  She had not gone for any of her outpatient dialysis session since last hospitalization due to transportation issues.  Noted to be fluid overloaded   Assessment and Plan: * ESRD on dialysis (HCC) Acute respiratory failure due to volume overload, pulmonary edema (acute), clinically now resolved.  Patient initially required Bipap, now off NIV 02 saturation today is 90% on 2 L/min per    Continue volume management with hemodialysis with ultrafiltration   Metabolic bone disease, continue calcitriol  and oral sodium bicarbonate   Anemia of chronic renal disease, continue iron  and EPO  Essential hypertension Continue blood pressure control with amlodipine  and carvedilol .   COPD (chronic obstructive pulmonary disease) (HCC) No signs of acute exacerbation, continue with bronchodilator therapy  Smoking cessation, nicotine  patch   Pressure injury of skin Continue local care   Anxiety and depression Continue with trazodone , alprazolam  and  gapabapentin  As needed alprazolam .  Continue pramipexole  Discontinue opiate analgesics and continue pain control with acetaminophen .   Hyperlipidemia Continue with atorvastatin    Polysubstance abuse (HCC) Follow up as outpatient         Subjective: Patient with no chest pain and no dyspnea, she is homeless and is planing to move to a motel. She has no HD unit as outpatient   Physical Exam: Vitals:   01/27/24 1944 01/27/24 2320 01/28/24 0405 01/28/24 0839  BP: (!) 141/68 (!) 134/43 (!) 154/74 (!) 141/71  Pulse: 79 84 76 68  Resp: 14 11 16 15   Temp: 97.7 F (36.5 C) 98.7 F (37.1 C) 97.8 F (36.6 C) 97.6 F (36.4 C)  TempSrc: Oral Temporal Temporal Oral  SpO2: 97% 92% 90% 98%  Weight:      Height:       Neurology awake and alert ENT with mild pallor Cardiovascular with S1 and S2 present and regular with no gallops, rubs or murmurs Respiratory with no rales or wheezing, no rhonchi  Abdomen with no distention no lower extremity edema   Data Reviewed:    Family Communication: no family at the bedside   Disposition: Status is: Inpatient Remains inpatient appropriate because: pending placement   Planned Discharge Destination: Home    Author: Elidia Toribio Furnace, MD 01/28/2024 10:46 AM  For on call review [email or web address].

## 2024-01-28 NOTE — Progress Notes (Signed)
 [facility] Progress Note  Subjective:  Got 4 L UF yest w/ HD  Vitals:   01/27/24 1944 01/27/24 2320 01/28/24 0405 01/28/24 0839  BP: (!) 141/68 (!) 134/43 (!) 154/74 (!) 141/71  Pulse: 79 84 76 68  Resp: 14 11 16 15   Temp: 97.7 F (36.5 C) 98.7 F (37.1 C) 97.8 F (36.6 C) 97.6 F (36.4 C)  TempSrc: Oral Temporal Temporal Oral  SpO2: 97% 92% 90% 98%  Weight:      Height:        Exam: Gen alert, [place] O2 2L  Chest clear bilat to bases RRR no MRG Abd soft ntnd, 1-2+ ascites +bs Ext no LE edema  Neuro is alert, Ox 3 , nf    RUA AVF+bruit    Home bp meds: Norvasc  10 every day Coreg  6.25 bid  Torsemide  100mg  every day      OP HD: does not have a HD unit Has RUA AVF now  From aug 2024 --> 3h  B350 Heparin  none R AVF   CXR 11/09 - IMPRESSION: Cardiomegaly with basilar predominant edema and new moderate left pleural effusion. Stable small right pleural effusion.    Assessment/ Plan: SOB/ vol overload: SOB requiring bipap in ED. Had HD here Sunday and yesterday. SOB resolved. Get f/u CXR. HD tomorrow.  ESRD: she does not have a HD unit. Comes to hospital for HD. HD today, cont TTS this week.  HTN: getting po norvasc  and coreg . Monitor.  Volume: improved clinically, on exam. Max UF next HD.  Anemia of esrd: Hb 8-9 range. Will start esa w/ darbe 60mcg weekly. Tsat 15%, will order IV Fe while here.  Homelessness     Rob Tax Adviser MD  [facility] 01/28/2024, 10:20 AM  Recent Labs  Lab 01/26/24 1051 01/27/24 0256 01/28/24 0253  HGB 8.2* 8.7*  --   ALBUMIN  2.6*  --   --   CALCIUM  7.4* 7.5* 7.4*  CREATININE 3.71* 4.26* 2.99*  K 4.1 4.4 4.0   Recent Labs  Lab 01/26/24 1051  IRON  44  TIBC 295  FERRITIN 322*   Inpatient medications:  amLODipine   10 mg Oral Daily   atorvastatin   10 mg Oral Daily   budesonide -glycopyrrolate -formoterol   2 puff Inhalation BID   calcitRIOL   0.25 mcg Oral Daily   carvedilol   6.25 mg Oral BID WC   Chlorhexidine   Gluconate Cloth  6 each Topical Q0600   Chlorhexidine  Gluconate Cloth  6 each Topical Q0600   darbepoetin (ARANESP ) injection - DIALYSIS  60 mcg Subcutaneous Q Tue-1800   gabapentin   100 mg Oral BID   guaiFENesin   600 mg Oral BID   nicotine   21 mg Transdermal Daily   pantoprazole   40 mg Oral BID   pramipexole  0.5 mg Oral QHS   senna-docusate  2 tablet Oral BID   sodium bicarbonate   1,300 mg Oral BID   traZODone   50 mg Oral QHS    acetaminophen  **OR** acetaminophen , albuterol , ALPRAZolam , chlorpheniramine-HYDROcodone , HYDROmorphone  (DILAUDID ) injection, naLOXone  (NARCAN )  injection, oxyCODONE , polyethylene glycol

## 2024-01-28 NOTE — Hospital Course (Addendum)
 Mrs. [name] was admitted to the hospital with the working diagnosis of volume overload in the setting of ESRD.   63 y.o. female with medical history significant of ESRD on HD MWF with history of dialysis noncompliance, homelessness, anemia of chronic disease, COPD, heart failure, hypertension, hyperlipidemia, PAD, peripheral neuropathy, chronic pain syndrome, polysubstance abuse (cocaine, marijuana, alcohol , tobacco), anxiety, depression, and GERD.   Recent hospitalization 10/14-10/31/2025 for severe constipation and ileus, cirrhosis with ascites requiring paracentesis, worsening anemia requiring blood transfusions, decompensated CHF, and UTI.   Patient presented with complaints of dyspnea She had not gone for any of her outpatient dialysis session since last hospitalization due to transportation issues, she has not HD unit as outpatient, coming to the hospital for hemodialysis.  Noted to be fluid overloaded  At home she developed severe dyspnea and edema, she called EMS. She was found in respiratory distress and hypoxic with 02 saturation 85%, placed on supplemental 02 and transported to the ED.  In the emergency department was placed on Bipap, her blood pressure was 124/73, HR 91, RR 23 and 02 saturation 93% Lungs with bilateral rhonchi, heart with S1 and S2 present and regular, abdomen with positive distention, positive lower extremity edema, right medial malleolus ulcerated wound with mild exudate, no significant erythema.   Na 125, K 7.0 Cl 89, bicarbonate 17 glucose 100, bun 71 cr 6.18 Ica 0,75  BNP 1,059  High sensitive troponin 11  Wbc 7,9 hgb 8.2 plt 472   Chest radiograph with right rotation, bilateral hilar vascular congestion, bilateral pleural effusions, more right than left.   EKG 96 bpm, normal axis, normal intervals, qtc 448 sinus rhythm with no significant ST segment or T wave changes.  Patient underwent urgent HD with improvement in her symptoms.   Abdominal ultrasound with  moderate volume ascites.   Patient has no HD unit as outpatient, she has been discharged from outpatients HD units due to poor compliance.  She plans to return to [employer] and keep working on stable housing. She will come back to the hospital for HD.

## 2024-01-28 NOTE — Progress Notes (Signed)
 Mobility Specialist Progress Note:    01/28/24 1124  Mobility  Activity Ambulated with assistance  Level of Assistance Contact guard assist, steadying assist  Assistive Device Cane  Distance Ambulated (ft) 200 ft  Range of Motion/Exercises Active  Activity Response Tolerated well  Mobility Referral Yes  Mobility visit 1 Mobility  Mobility Specialist Start Time (ACUTE ONLY) 1124  Mobility Specialist Stop Time (ACUTE ONLY) 1141  Mobility Specialist Time Calculation (min) (ACUTE ONLY) 17 min   Received pt laying in bed eager and agreeable to session. No c/'o any symptoms. Pt moving and ambulate decently w/ one break to focus on breathing technique. Pt can need to be redirected at moments. Pt may be incontinent and not know it. Returned pt to bed w/ all needs met.   Venetia Keel Mobility Specialist Please Neurosurgeon or Rehab Office at [phone]

## 2024-01-28 NOTE — Assessment & Plan Note (Addendum)
 Continue local care, right medial ankle

## 2024-01-28 NOTE — Assessment & Plan Note (Signed)
Continue blood pressure control with amlodipine and carvedilol.

## 2024-01-29 ENCOUNTER — Other Ambulatory Visit (HOSPITAL_COMMUNITY): Payer: Self-pay

## 2024-01-29 LAB — BASIC METABOLIC PANEL WITH GFR
Anion gap: 12 (ref 5–15)
BUN: 25 mg/dL — ABNORMAL HIGH (ref 8–23)
CO2: 25 mmol/L (ref 22–32)
Calcium: 7.7 mg/dL — ABNORMAL LOW (ref 8.9–10.3)
Chloride: 88 mmol/L — ABNORMAL LOW (ref 98–111)
Creatinine, Ser: 3.64 mg/dL — ABNORMAL HIGH (ref 0.44–1.00)
GFR, Estimated: 13 mL/min — ABNORMAL LOW (ref >60)
Glucose, Bld: 90 mg/dL (ref 70–99)
Potassium: 4.1 mmol/L (ref 3.5–5.1)
Sodium: 125 mmol/L — ABNORMAL LOW (ref 135–145)

## 2024-01-29 LAB — CBC
HCT: 28.5 % — ABNORMAL LOW (ref 36.0–46.0)
Hemoglobin: 9.4 g/dL — ABNORMAL LOW (ref 12.0–15.0)
MCH: 31.3 pg (ref 26.0–34.0)
MCHC: 33 g/dL (ref 30.0–36.0)
MCV: 95 fL (ref 80.0–100.0)
Platelets: 430 K/uL — ABNORMAL HIGH (ref 150–400)
RBC: 3 MIL/uL — ABNORMAL LOW (ref 3.87–5.11)
RDW: 17.2 % — ABNORMAL HIGH (ref 11.5–15.5)
WBC: 7.9 K/uL (ref 4.0–10.5)
nRBC: 0 % (ref 0.0–0.2)

## 2024-01-29 MED ORDER — ALBUTEROL SULFATE (2.5 MG/3ML) 0.083% IN NEBU
INHALATION_SOLUTION | RESPIRATORY_TRACT | Status: AC
  Filled 2024-01-29: qty 3

## 2024-01-29 MED ORDER — OXYCODONE HCL 5 MG PO TABS
5.0000 mg | ORAL_TABLET | Freq: Four times a day (QID) | ORAL | Status: DC | PRN
  Administered 2024-01-29 – 2024-01-30 (×2): 5 mg via ORAL
  Filled 2024-01-29 (×2): qty 1

## 2024-01-29 NOTE — Plan of Care (Signed)

## 2024-01-29 NOTE — TOC Progression Note (Signed)
 Transition of Care [facility]) - Progression Note    Patient Details  Name: [name] MRN: [id number] Date of Birth: 03/15/1961  [facility] CM/SW Contact  Roxie KANDICE Stain, RN Phone Number: 01/29/2024, 1:35 PM  Clinical Narrative:    Sent message to [place] with [facility] to review patient for medicaid.    Expected Discharge Plan:  ([employer]) Barriers to Discharge: Continued Medical Work up, Inadequate or no insurance, Homeless with medical needs               Expected Discharge Plan and Services                                               Social Drivers of Health (SDOH) Interventions SDOH Screenings   Food Insecurity: Food Insecurity Present (01/26/2024)  Housing: High Risk (01/26/2024)  Transportation Needs: Unmet Transportation Needs (01/26/2024)  Utilities: At Risk (01/26/2024)  Alcohol  Screen: Low Risk  (02/10/2023)  Depression (PHQ2-9): Medium Risk (06/11/2023)  Financial Resource Strain: High Risk (02/10/2023)  Physical Activity: Inactive (02/10/2023)  Social Connections: Socially Isolated (06/12/2023)  Tobacco Use: High Risk (01/25/2024)  Health Literacy: Adequate Health Literacy (02/10/2023)    Readmission Risk Interventions    01/26/2024    4:49 PM 01/14/2024    9:38 AM 07/01/2023   12:39 PM  Readmission Risk Prevention Plan  Transportation Screening Complete Complete Complete  Medication Review (RN Care Manager) Complete Referral to Pharmacy Referral to Pharmacy  PCP or Specialist appointment within 3-5 days of discharge  Complete Complete  HRI or Home Care Consult Complete Complete Complete  SW Recovery Care/Counseling Consult  Complete Complete  Palliative Care Screening Not Applicable Not Applicable Not Applicable  Skilled Nursing Facility Not Applicable Not Applicable Not Applicable

## 2024-01-29 NOTE — Progress Notes (Addendum)
 Received patient in bed to unit.  Alert and oriented.  Informed consent signed and in chart.   TX duration:3.5 hours  Patient tolerated well.  Transported back to the room  Alert, without acute distress.  Hand-off given to patient's nurse.   Access used: Right upper arm fistula Access issues: none  Total UF removed: 5.3L Medication(s) given: Venefor, albuterol    01/29/24 1238  Vitals  Temp 97.9 F (36.6 C)  Temp Source Axillary  BP (!) 131/104  Pulse Rate 79  ECG Heart Rate 82  Resp 20  Oxygen  Therapy  SpO2 98 %  O2 Device Room Air  During Treatment Monitoring  HD Safety Checks Performed Yes  Intra-Hemodialysis Comments Tx completed  Post Treatment  Dialyzer Clearance Clear  Liters Processed 73.5  Fluid Removed (mL) 5.3 mL  Tolerated HD Treatment Yes  Post-Hemodialysis Comments Extra UF in goal was IV Venefor  AVG/AVF Arterial Site Held (minutes) 7 minutes  AVG/AVF Venous Site Held (minutes) 7 minutes  Fistula / Graft Right Upper arm Arteriovenous fistula  Placement Date/Time: 08/14/22 1230   Orientation: Right  Access Location: (c) Upper arm  Access Type: Arteriovenous fistula  Site Condition No complications  Fistula / Graft Assessment Present;Thrill;Bruit  Status Patent;Deaccessed  Drainage Description None     [name] LPN [facility]

## 2024-01-29 NOTE — Progress Notes (Signed)
 [name] Progress Note  Subjective:  Got 5 L UF yest w/ HD today  Vitals:   01/29/24 1200 01/29/24 1230 01/29/24 1238 01/29/24 1248  BP: (!) 146/71 119/71 (!) 131/104 (!) 114/98  Pulse: 65 86 79 75  Resp: 14 10 20  (!) 24  Temp:   97.9 F (36.6 C)   TempSrc:   Axillary   SpO2: 96% 98% 98% 100%  Weight:   54.4 kg   Height:        Exam: Gen alert, [place] O2 2L  Chest clear bilat to bases RRR no MRG Abd soft ntnd, 1+ ascites +bs Ext no LE edema  Neuro is alert, Ox 3 , nf    RUA AVF+bruit    Home bp meds: Norvasc  10 every day Coreg  6.25 bid  Torsemide  100mg  every day      OP HD: does not have a HD unit Has RUA AVF now  From aug 2024 --> 3h  B350 Heparin  none R AVF   CXR 11/09 - IMPRESSION: Cardiomegaly with basilar predominant edema and new moderate left pleural effusion. Stable small right pleural effusion.    Assessment/ Plan: SOB/ vol overload: SOB requiring bipap in ED. Had HD here Sunday and yesterday. SOB resolved. On room air. 5 L UF w/ HD this am.  ESRD: she does not have a HD unit. Comes to hospital for HD. HD today, cont TTS this week.  HTN: getting po norvasc  and coreg . Monitor.  Volume: improved clinically, on exam. Max UF next HD.  Anemia of esrd: Hb 8-9 range. Started darbe 60 mcg q 1-2 wks, 1st dose 11/12. Tsat 15%- gave 200mg  IV venofer  yest and today.  Homelessness Dispo: ok for dc from renal standpoint     Cynthia Geralynn MD  [facility] 01/29/2024, 2:09 PM  Recent Labs  Lab 01/26/24 1051 01/27/24 0256 01/28/24 0253 01/29/24 0426 01/29/24 0830  HGB 8.2* 8.7*  --   --  9.4*  ALBUMIN  2.6*  --   --   --   --   CALCIUM  7.4* 7.5* 7.4* 7.7*  --   CREATININE 3.71* 4.26* 2.99* 3.64*  --   K 4.1 4.4 4.0 4.1  --    Recent Labs  Lab 01/26/24 1051  IRON  44  TIBC 295  FERRITIN 322*   Inpatient medications:  amLODipine   10 mg Oral Daily   atorvastatin   10 mg Oral Daily   budesonide -glycopyrrolate -formoterol   2 puff Inhalation BID    calcitRIOL   0.25 mcg Oral Daily   carvedilol   6.25 mg Oral BID WC   Chlorhexidine  Gluconate Cloth  6 each Topical Q0600   Chlorhexidine  Gluconate Cloth  6 each Topical Q0600   Chlorhexidine  Gluconate Cloth  6 each Topical Q0600   darbepoetin (ARANESP ) injection - DIALYSIS  60 mcg Subcutaneous Q Tue-1800   gabapentin   100 mg Oral BID   guaiFENesin   600 mg Oral BID   nicotine   21 mg Transdermal Daily   pantoprazole   40 mg Oral BID   pramipexole  0.5 mg Oral QHS   senna-docusate  2 tablet Oral BID   sodium bicarbonate   1,300 mg Oral BID   traZODone   50 mg Oral QHS    acetaminophen  **OR** acetaminophen , albuterol , ALPRAZolam , chlorpheniramine-HYDROcodone , naLOXone  (NARCAN )  injection, polyethylene glycol, sodium chloride 

## 2024-01-29 NOTE — Progress Notes (Signed)
   01/29/24 2126  BiPAP/CPAP/SIPAP  BiPAP/CPAP/SIPAP Pt Type Adult  BiPAP/CPAP/SIPAP SERVO  Reason BIPAP/CPAP not in use Other(comment) (BiPAP not indicated at this time)

## 2024-01-29 NOTE — Progress Notes (Addendum)
  Progress Note   Patient: [name] FMW:[id number] DOB: 04-16-1960 DOA: 01/25/2024     4 DOS: the patient was seen and examined on 01/29/2024   Brief hospital course: Mrs. [name] was admitted to the hospital with the working diagnosis of volume overload in the setting of ESRD.   63 y.o. female with medical history significant of ESRD on HD MWF with history of dialysis noncompliance, homelessness, anemia of chronic disease, COPD, HFpEF (echo 10/23/2023 with EF 60 to 65%, grade 2 diastolic dysfunction, mild mitral regurgitation), hypertension, hyperlipidemia, PAD, peripheral neuropathy, chronic pain syndrome, polysubstance abuse (cocaine, marijuana, alcohol , tobacco), anxiety, depression, GERD.   Recent hospital admission 10/14-10/31/2025 for severe constipation and ileus, cirrhosis with ascites requiring paracentesis, worsening anemia requiring blood transfusions, decompensated CHF, and UTI.   Patient presented with complaints of dyspnea She had not gone for any of her outpatient dialysis session since last hospitalization due to transportation issues.  Noted to be fluid overloaded   11/13 HD with good toleration.  Patient has no HD unit as outpatient, she has been discharged from outpatients HD units due to poor compliance.  She plans to return to [employer] and keep working on stable housing. She will come back to the hospital for HD.   Assessment and Plan: * ESRD on dialysis (HCC) Acute respiratory failure due to volume overload, pulmonary edema (acute), clinically now resolved.  Patient initially required Bipap, now off NIV Today 02 saturation is 96% on room air.   Pre HD BUN 25, with K at 4.1 and serum bicarbonate at 25 Na 125   Continue volume management with hemodialysis with ultrafiltration   Metabolic bone disease, continue calcitriol  and oral sodium bicarbonate   Anemia of chronic renal disease, continue iron  and EPO  Essential hypertension Continue blood pressure control with  amlodipine  and carvedilol .   COPD (chronic obstructive pulmonary disease) (HCC) No signs of acute exacerbation, continue with bronchodilator therapy  Smoking cessation, nicotine  patch   Pressure injury of skin Continue local care, right medial ankle  Anxiety and depression Continue with trazodone , alprazolam  and gapabapentin  As needed alprazolam .  Continue pramipexole  Will resume oxycodone  for bilateral lower extremity pain and neuropathy.   Hyperlipidemia Continue with atorvastatin    Polysubstance abuse (HCC) Follow up as outpatient       Subjective: Patient with no chest pain or dyspnea, she tolerated well hemodialysis, complains of bilateral leg pain   Physical Exam: Vitals:   01/29/24 0930 01/29/24 1000 01/29/24 1030 01/29/24 1100  BP: 137/70 (!) 143/73 (!) 154/71 139/65  Pulse: 70 84 69 73  Resp: 17 15 14 16   Temp:      TempSrc:      SpO2: 94% 97% 94% 95%  Weight:      Height:       Neurology awake and alert ENT with mild pallor Cardiovascular with S1 and S2 present and regular with no gallops or rubs Respiratory with no wheezing or rhonchi  Abdomen with no distention  No lower extremity edema  Right inner ankle with small chronic round wound,    Data Reviewed:    Family Communication: no family at the bedside   Disposition: Status is: Inpatient Remains inpatient appropriate because: plan for discharge tomorrow   Planned Discharge Destination: Home   Author: Elidia Toribio Furnace, MD 01/29/2024 11:54 AM  For on call review [email or web address].

## 2024-01-30 ENCOUNTER — Other Ambulatory Visit (HOSPITAL_COMMUNITY): Payer: Self-pay

## 2024-01-30 MED ORDER — CALCITRIOL 0.25 MCG PO CAPS
0.2500 ug | ORAL_CAPSULE | Freq: Every day | ORAL | 0 refills | Status: DC
  Filled 2024-01-30: qty 30, 30d supply, fill #0

## 2024-01-30 MED ORDER — CARVEDILOL 6.25 MG PO TABS
6.2500 mg | ORAL_TABLET | Freq: Two times a day (BID) | ORAL | 0 refills | Status: DC
  Filled 2024-01-30: qty 60, 30d supply, fill #0

## 2024-01-30 MED ORDER — AMLODIPINE BESYLATE 10 MG PO TABS
10.0000 mg | ORAL_TABLET | Freq: Every day | ORAL | 0 refills | Status: DC
  Filled 2024-01-30: qty 30, 30d supply, fill #0

## 2024-01-30 MED ORDER — GABAPENTIN 100 MG PO CAPS
100.0000 mg | ORAL_CAPSULE | Freq: Two times a day (BID) | ORAL | 0 refills | Status: DC
  Filled 2024-01-30: qty 60, 30d supply, fill #0

## 2024-01-30 MED ORDER — TRELEGY ELLIPTA 100-62.5-25 MCG/ACT IN AEPB
1.0000 | INHALATION_SPRAY | Freq: Every day | RESPIRATORY_TRACT | 0 refills | Status: DC
  Filled 2024-01-30: qty 60, 30d supply, fill #0

## 2024-01-30 MED ORDER — PANTOPRAZOLE SODIUM 40 MG PO TBEC
40.0000 mg | DELAYED_RELEASE_TABLET | Freq: Two times a day (BID) | ORAL | 0 refills | Status: DC
  Filled 2024-01-30: qty 60, 30d supply, fill #0

## 2024-01-30 MED ORDER — TORSEMIDE 100 MG PO TABS
100.0000 mg | ORAL_TABLET | Freq: Every day | ORAL | 0 refills | Status: DC
  Filled 2024-01-30: qty 30, 30d supply, fill #0

## 2024-01-30 MED ORDER — ALBUTEROL SULFATE HFA 108 (90 BASE) MCG/ACT IN AERS
2.0000 | INHALATION_SPRAY | Freq: Four times a day (QID) | RESPIRATORY_TRACT | 0 refills | Status: DC | PRN
  Filled 2024-01-30: qty 18, 25d supply, fill #0

## 2024-01-30 MED ORDER — ATORVASTATIN CALCIUM 10 MG PO TABS
10.0000 mg | ORAL_TABLET | Freq: Every day | ORAL | 0 refills | Status: DC
  Filled 2024-01-30: qty 30, 30d supply, fill #0

## 2024-01-30 MED ORDER — SODIUM BICARBONATE 650 MG PO TABS
1300.0000 mg | ORAL_TABLET | Freq: Two times a day (BID) | ORAL | 0 refills | Status: DC
  Filled 2024-01-30: qty 120, 30d supply, fill #0

## 2024-01-30 NOTE — Discharge Summary (Addendum)
 Physician Discharge Summary   Patient: [name] MRN: [id number] DOB: 1960/05/31  Admit date:     01/25/2024  Discharge date: 01/30/24  Discharge Physician: Elidia Sieving Davin Muramoto   PCP: Pcp, No   Recommendations at discharge:    Patient was advised to come to the hospital three time per week, Saturday, Tuesday and Thursday for hemodialysis. Currently she has no outpatient HD unit due to non compliance, she has no stable home. She has been coming to the hospital for renal replacement therapy.  Patient still making urine, and has a right upper extremity fistula.  Plan to continue torsemide  100 mg po daily and blood pressure control with amlodipine  and carvedilol , medications refilled.  Follow up with primary care in 7 to 10 days.   Discharge Diagnoses: Principal Problem:   ESRD on dialysis [facility]) Active Problems:   Essential hypertension   COPD (chronic obstructive pulmonary disease) (HCC)   Anxiety and depression   Pressure injury of skin   Hyperlipidemia   Polysubstance abuse (HCC)  Resolved Problems:   * No resolved hospital problems. [facility] Course: [name] was admitted to the hospital with the working diagnosis of volume overload in the setting of ESRD.   63 y.o. female with medical history significant of ESRD on HD MWF with history of dialysis noncompliance, homelessness, anemia of chronic disease, COPD, heart failure, hypertension, hyperlipidemia, PAD, peripheral neuropathy, chronic pain syndrome, polysubstance abuse (cocaine, marijuana, alcohol , tobacco), anxiety, depression, and GERD.   Recent hospitalization 10/14-10/31/2025 for severe constipation and ileus, cirrhosis with ascites requiring paracentesis, worsening anemia requiring blood transfusions, decompensated CHF, and UTI.   Patient presented with complaints of dyspnea She had not gone for any of her outpatient dialysis session since last hospitalization due to transportation issues, she has not HD unit  as outpatient, coming to the hospital for hemodialysis.  Noted to be fluid overloaded  At home she developed severe dyspnea and edema, she called EMS. She was found in respiratory distress and hypoxic with 02 saturation 85%, placed on supplemental 02 and transported to the ED.  In the emergency department was placed on Bipap, her blood pressure was 124/73, HR 91, RR 23 and 02 saturation 93% Lungs with bilateral rhonchi, heart with S1 and S2 present and regular, abdomen with positive distention, positive lower extremity edema, right medial malleolus ulcerated wound with mild exudate, no significant erythema.   Na 125, K 7.0 Cl 89, bicarbonate 17 glucose 100, bun 71 cr 6.18 Ica 0,75  BNP 1,059  High sensitive troponin 11  Wbc 7,9 hgb 8.2 plt 472   Chest radiograph with right rotation, bilateral hilar vascular congestion, bilateral pleural effusions, more right than left.   EKG 96 bpm, normal axis, normal intervals, qtc 448 sinus rhythm with no significant ST segment or T wave changes.  Patient underwent urgent HD with improvement in her symptoms.   Abdominal ultrasound with moderate volume ascites.   Patient has no HD unit as outpatient, she has been discharged from outpatients HD units due to poor compliance.  She plans to return to [employer] and keep working on stable housing. She will come back to the hospital for HD.   Assessment and Plan: * ESRD on dialysis (HCC) Acute respiratory failure due to volume overload, pulmonary edema (acute), clinically now resolved.  Hyponatremia, hyperkalemia and hypocalcemia Patient still able to make urine.  HD access right upper extremity fistula  Patient initially required Bipap, now off NIV Today 02 saturation is 96% on room  air.   11/13 Pre HD BUN 25, with K at 4.1 and serum bicarbonate at 25 Na 125   Continue volume management with hemodialysis with ultrafiltration. She has been advised to come to the hospital Saturdays, Thursdays and Tuesdays for  hemodialysis. (Three time per week) Continue torsemide  100 mg po daily.    Metabolic bone disease, continue calcitriol  and oral sodium bicarbonate   Anemia of chronic renal disease, patient had IV iron  and plan to continue EPO q week   Essential hypertension Continue blood pressure control with amlodipine  and carvedilol .   COPD (chronic obstructive pulmonary disease) (HCC) No signs of acute exacerbation, continue with inhaled corticosteroids and bronchodilator therapy  Smoking cessation.  Pressure injury of skin Continue local care, right medial ankle  Anxiety and depression Chronic pain, neuropathy.  Continue gabapentin .    Hyperlipidemia Continue with atorvastatin    Polysubstance abuse (HCC) Follow up as outpatient  Patient tested positive for cocaine 12/30/23        Consultants: nephrology  Procedures performed: none   Disposition: Home Diet recommendation:  Cardiac diet DISCHARGE MEDICATION: Allergies as of 01/30/2024       Reactions   Zestril  [lisinopril ] Other (See Comments)   Hyperkalemia        Medication List     STOP taking these medications    ALPRAZolam  0.25 MG tablet Commonly known as: XANAX    ferrous sulfate  325 (65 FE) MG tablet Commonly known as: FeroSul   nicotine  21 mg/24hr patch Commonly known as: NICODERM CQ  - dosed in mg/24 hours   oxyCODONE  5 MG immediate release tablet Commonly known as: Roxicodone    polyethylene glycol powder 17 GM/SCOOP powder Commonly known as: GLYCOLAX /MIRALAX    traZODone  50 MG tablet Commonly known as: DESYREL        TAKE these medications    acetaminophen  325 MG tablet Commonly known as: TYLENOL  Take 2 tablets (650 mg total) by mouth every 6 (six) hours as needed for mild pain (pain score 1-3).   albuterol  108 (90 Base) MCG/ACT inhaler Commonly known as: VENTOLIN  HFA Inhale 2 puffs into the lungs every 6 (six) hours as needed for wheezing or shortness of breath.   amLODipine  10 MG  tablet Commonly known as: NORVASC  Take 1 tablet (10 mg total) by mouth daily.   atorvastatin  10 MG tablet Commonly known as: LIPITOR Take 1 tablet (10 mg total) by mouth daily.   calcitRIOL  0.25 MCG capsule Commonly known as: ROCALTROL  Take 1 capsule (0.25 mcg total) by mouth daily.   carvedilol  6.25 MG tablet Commonly known as: COREG  Take 1 tablet (6.25 mg total) by mouth 2 (two) times daily with a meal.   gabapentin  100 MG capsule Commonly known as: NEURONTIN  Take 1 capsule (100 mg total) by mouth 2 (two) times daily for 60 doses. What changed: when to take this   pantoprazole  40 MG tablet Commonly known as: PROTONIX  Take 1 tablet (40 mg total) by mouth 2 (two) times daily.   sodium bicarbonate  650 MG tablet Take 2 tablets (1,300 mg total) by mouth 2 (two) times daily.   torsemide  100 MG tablet Commonly known as: DEMADEX  Take 1 tablet (100 mg total) by mouth daily.   Trelegy Ellipta  100-62.5-25 MCG/ACT Aepb Generic drug: Fluticasone -Umeclidin-Vilant Inhale 1 puff into the lungs daily.        Follow-up Information     Vicci Barnie NOVAK, MD Follow up.   Specialty: Internal Medicine Contact information: [address] [place] [address] [place] [address] [phone]  Health, [address] Follow up.   Contact information: [address] [place] [address] [phone]                Discharge Exam: Fredricka Weights   01/27/24 1249 01/29/24 0819 01/29/24 1238  Weight: 60.3 kg 59.7 kg 54.4 kg   BP (!) 144/70 (BP Location: Right Arm)   Pulse 68   Temp 98.3 F (36.8 C) (Oral)   Resp 16   Ht 5' 7 (1.702 m)   Wt 54.4 kg   SpO2 96%   BMI 18.78 kg/m   Patient with no chest pain and no dyspnea, no nausea or vomiting.   Neurology awake and alert ENT with mild pallor Cardiovascular with S1 and S2 present and regular with no gallops, rubs or murmurs Respiratory with no rales or wheezing, no rhonchi  Abdomen with no distention  No lower  extremity edema   Condition at discharge: stable  The results of significant diagnostics from this hospitalization (including imaging, microbiology, ancillary and laboratory) are listed below for reference.   Imaging Studies: US  ASCITES (ABDOMEN LIMITED) Result Date: 01/26/2024 CLINICAL DATA:  Ascites EXAM: LIMITED ABDOMEN ULTRASOUND FOR ASCITES TECHNIQUE: Limited ultrasound survey for ascites was performed in all four abdominal quadrants. COMPARISON:  CT scan 01/06/2024 FINDINGS: Small to moderate volume ascites identified in all 4 quadrants. IMPRESSION: Small to moderate volume ascites. Electronically Signed   By: Camellia Candle M.D.   On: 01/26/2024 05:55   DG Chest Port 1 View Result Date: 01/25/2024 EXAM: 1 VIEW(S) XRAY OF THE CHEST 01/25/2024 06:47:00 PM COMPARISON: 12/30/2023 CLINICAL HISTORY: SOB FINDINGS: LUNGS AND PLEURA: New moderate left pleural effusion. Stable small right pleural effusion. Bibasilar consolidation/atelectasis, left greater than right. Mild pulmonary edema. No pneumothorax. HEART AND MEDIASTINUM: Stable cardiomegaly. Aortic atherosclerosis (ICD10-170.0). BONES AND SOFT TISSUES: No acute osseous abnormality. IMPRESSION: 1. Cardiomegaly with basilar predominant edema and new moderate left pleural effusion. Stable small right pleural effusion. Electronically signed by: Norman Gatlin MD 01/25/2024 07:09 PM EST RP Workstation: [id number]   DG Abd 1 View Result Date: 01/07/2024 EXAM: 1 VIEW XRAY OF THE ABDOMEN 01/07/2024 09:15:00 PM COMPARISON: Comparison with same day x-ray and CT 01/06/2024. CLINICAL HISTORY: Constipation. FINDINGS: BOWEL: Persistent dilation of the small bowel. Large colonic stool burden. SOFT TISSUES: No opaque urinary calculi. BONES: No acute osseous abnormality. IMPRESSION: 1. Persistent dilation of the small bowel. 2. Large colonic stool burden. Electronically signed by: Norman Gatlin MD 01/07/2024 09:23 PM EDT RP Workstation: [id number]   DG Abd 1  View Result Date: 01/07/2024 CLINICAL DATA:  Abdominal distension, enteric catheter placement EXAM: ABDOMEN - 1 VIEW COMPARISON:  01/06/2024 FINDINGS: Frontal view of the lower chest and upper abdomen demonstrates enteric catheter passing below diaphragm, tip and side port projecting over the gastric body. Continued distension of the large and small bowel, with large amount of retained stool throughout the colon again noted. Stable left pleural effusion. IMPRESSION: 1. Enteric catheter tip and side port projecting over the gastric body. 2. Stable distension of the large and small bowel compatible with ileus or distal obstruction. Electronically Signed   By: Ozell Daring M.D.   On: 01/07/2024 15:23   IR ABDOMEN US  LIMITED Result Date: 01/07/2024 INDICATION: History of ESRD on hemodialysis. Moderate but decreasing amount of ascites on CT yesterday. Consult for paracentesis. EXAM: ULTRASOUND ABDOMEN LIMITED FINDINGS: Imaging of all 4 quadrants of the abdomen reveal no ascites IMPRESSION: No ascites seen in ultrasound.  No need for paracentesis. Performed by: Kimble  Pommier, PA-C Electronically Signed   By: CHRISTELLA.  Shick M.D.   On: 01/07/2024 14:25   CT ABDOMEN PELVIS WO CONTRAST Result Date: 01/06/2024 EXAM: CT ABDOMEN AND PELVIS WITHOUT CONTRAST 01/06/2024 04:24:55 PM TECHNIQUE: CT of the abdomen and pelvis was performed without the administration of intravenous contrast. Multiplanar reformatted images are provided for review. Automated exposure control, iterative reconstruction, and/or weight-based adjustment of the mA/kV was utilized to reduce the radiation dose to as low as reasonably achievable. COMPARISON: Comparison with 12/30/2023. CLINICAL HISTORY: Abdominal pain, acute, nonlocalized. FINDINGS: LOWER CHEST: Small left greater than right pleural effusions. Associated basilar atelectasis or pneumonia. LIVER: Nodular Hepatic contour compatible with cirrhosis. GALLBLADDER AND BILE DUCTS: Gallbladder is  unremarkable. No biliary ductal dilatation. SPLEEN: No acute abnormality. PANCREAS: No acute abnormality. ADRENAL GLANDS: No acute abnormality. 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 No stones in the kidneys or ureters. No hydronephrosis. No perinephric or periureteral stranding. Urinary bladder is unremarkable. GI AND BOWEL: Distended stomach. Diffuse dilation of the small bowel. No abrupt transition. Gradual dilution of enteric contrast in the small bowel. Fecalization of the distal ileum. Findings are compatible with slow transit / ileus. Moderate to large colonic stool burden. Normal appendix. PERITONEUM AND RETROPERITONEUM: Moderate abdominal pelvic ascites decreased from 12/30/2023. No free air. VASCULATURE: Aorta is normal in caliber. Aortic atherosclerotic calcification. LYMPH NODES: No lymphadenopathy. REPRODUCTIVE ORGANS: No acute abnormality. 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 No acute osseous abnormality. Body wall edema. IMPRESSION: 1. Findings compatible with slow transit/ileus  and constipation. 2. Moderate abdominopelvic ascites, decreased from 12/30/23. 3. Small left greater than right pleural effusions with associated basilar atelectasis or pneumonia. 4. Cirrhosis. Electronically signed by: Norman Gatlin MD 01/06/2024 08:26 PM EDT RP Workstation: [id number]   IR Paracentesis Result Date: 01/02/2024 INDICATION: 63 year old female with history of HFpEF/ESRD with volume overload, admitted for acute respiratory failure, and diagnosed with new onset ascites. IR was requested for diagnostic and therapeutic paracentesis. EXAM: ULTRASOUND GUIDED DIAGNOSTIC AND THERAPEUTIC PARACENTESIS MEDICATIONS: 5 cc of 1% lidocaine  with epi. COMPLICATIONS: None immediate. PROCEDURE: Informed written consent was obtained from the patient after a discussion of the risks, benefits and alternatives to treatment. A timeout was performed prior to the  initiation of the procedure. Initial ultrasound scanning demonstrates a large amount of ascites within the right lower abdominal quadrant. The right lower abdomen was prepped and draped in the usual sterile fashion. 1% lidocaine  was used for local anesthesia. Following this, a 19 gauge, 7-cm, Yueh catheter was introduced. An ultrasound image was saved for documentation purposes. The paracentesis was performed. The catheter was removed and a dressing was applied. The patient tolerated the procedure well without immediate post procedural complication. FINDINGS: A total of approximately 2.8 L of clear, straw-colored peritoneal fluid was removed. Samples were sent to the laboratory as requested by the clinical team. IMPRESSION: Successful ultrasound-guided paracentesis yielding 2.8 liters of peritoneal fluid. Procedure performed by Carlin Griffon, PA-C Electronically Signed   By: Cordella Banner   On: 01/02/2024 16:56    Microbiology: Results for orders placed or performed during the hospital encounter of 12/30/23  Blood Culture (routine x 2)     Status: None   Collection Time: 12/30/23 12:25 PM   Specimen: BLOOD  Result Value Ref Range Status   Specimen Description BLOOD LEFT ANTECUBITAL  Final   Special Requests   Final    BOTTLES DRAWN AEROBIC AND ANAEROBIC Blood Culture adequate volume   Culture  Final    NO GROWTH 5 DAYS Performed at [facility], [address], [place], [place] [address]    Report Status 01/04/2024 FINAL  Final  Resp panel by RT-PCR (RSV, Flu A&B, Covid) Anterior Nasal Swab     Status: None   Collection Time: 12/30/23 12:25 PM   Specimen: Anterior Nasal Swab  Result Value Ref Range Status   SARS Coronavirus 2 by RT PCR NEGATIVE NEGATIVE Final   Influenza A by PCR NEGATIVE NEGATIVE Final   Influenza B by PCR NEGATIVE NEGATIVE Final    Comment: (NOTE) The Xpert Xpress SARS-CoV-2/FLU/RSV plus assay is intended as an aid in the diagnosis of influenza from  Nasopharyngeal swab specimens and should not be used as a sole basis for treatment. Nasal washings and aspirates are unacceptable for Xpert Xpress SARS-CoV-2/FLU/RSV testing.  Fact Sheet for Patients: bloggercourse.com  Fact Sheet for Healthcare Providers: seriousbroker.it  This test is not yet approved or cleared by the [place]  FDA and has been authorized for detection and/or diagnosis of SARS-CoV-2 by FDA under an Emergency Use Authorization (EUA). This EUA will remain in effect (meaning this test can be used) for the duration of the COVID-19 declaration under Section 564(b)(1) of the Act, 21 U.S.C. section 360bbb-3(b)(1), unless the authorization is terminated or revoked.     Resp Syncytial Virus by PCR NEGATIVE NEGATIVE Final    Comment: (NOTE) Fact Sheet for Patients: bloggercourse.com  Fact Sheet for Healthcare Providers: seriousbroker.it  This test is not yet approved or cleared by the [place]  FDA and has been authorized for detection and/or diagnosis of SARS-CoV-2 by FDA under an Emergency Use Authorization (EUA). This EUA will remain in effect (meaning this test can be used) for the duration of the COVID-19 declaration under Section 564(b)(1) of the Act, 21 U.S.C. section 360bbb-3(b)(1), unless the authorization is terminated or revoked.  Performed at [facility], [address], [place], [place] [address]   Urine Culture     Status: Abnormal   Collection Time: 12/30/23  1:05 PM   Specimen: Urine, Random  Result Value Ref Range Status   Specimen Description URINE, RANDOM  Final   Special Requests NONE Reflexed from [id number]  Final   Culture (A)  Final    >=100,000 COLONIES/mL GROUP B STREP(S.AGALACTIAE)ISOLATED TESTING AGAINST S. AGALACTIAE NOT ROUTINELY PERFORMED DUE TO PREDICTABILITY OF AMP/PEN/VAN SUSCEPTIBILITY. Performed at [facility], [address], [place], [place] [address]    Report Status 12/31/2023 FINAL  Final  Blood Culture (routine x 2)     Status: None   Collection Time: 12/30/23  5:00 PM   Specimen: BLOOD LEFT HAND  Result Value Ref Range Status   Specimen Description BLOOD LEFT HAND  Final   Special Requests   Final    BOTTLES DRAWN AEROBIC AND ANAEROBIC Blood Culture adequate volume   Culture   Final    NO GROWTH 5 DAYS Performed at [facility], [address], [place], [place] [address]    Report Status 01/04/2024 FINAL  Final  Culture, body fluid w Gram Stain-bottle     Status: None   Collection Time: 01/02/24 11:52 AM   Specimen: Peritoneal Washings  Result Value Ref Range Status   Specimen Description PERITONEAL  Final   Special Requests NONE  Final   Culture   Final    NO GROWTH 5 DAYS Performed at [facility], [address], [place], [place] [address]  Report Status 01/07/2024 FINAL  Final  Gram stain     Status: None   Collection Time: 01/02/24 11:52 AM   Specimen: Peritoneal Washings  Result Value Ref Range Status   Specimen Description PERITONEAL  Final   Special Requests NONE  Final   Gram Stain   Final    WBC PRESENT, PREDOMINANTLY MONONUCLEAR NO ORGANISMS SEEN CYTOSPIN SMEAR Performed at [facility], [address], [place], [place] [address]    Report Status 01/02/2024 FINAL  Final    Labs: CBC: Recent Labs  Lab 01/25/24 1743 01/25/24 1754 01/26/24 1051 01/27/24 0256 01/29/24 0830  WBC 7.9  --  9.5 9.6 7.9  NEUTROABS 6.1  --   --   --   --   HGB 8.2* 8.8* 8.2* 8.7* 9.4*  HCT 26.4* 26.0* 25.4* 27.4* 28.5*  MCV 99.6  --  95.1 96.5 95.0  PLT 472*  --  451* 452* 430*   Basic Metabolic Panel: Recent Labs  Lab 01/25/24 1743 01/25/24 1754 01/26/24 1051 01/27/24 0256 01/28/24 0253 01/29/24 0426  NA 125* 122* 129* 125* 129* 125*  K 7.0* 6.6* 4.1 4.4 4.0 4.1  CL 89* 94* 89* 87* 89* 88*  CO2 17*  --  24 26 25 25   GLUCOSE 100* 105*  117* 91 109* 90  BUN 71* 79* 30* 32* 19 25*  CREATININE 6.18* 6.90* 3.71* 4.26* 2.99* 3.64*  CALCIUM  7.1*  --  7.4* 7.5* 7.4* 7.7*   Liver Function Tests: Recent Labs  Lab 01/26/24 1051  AST 25  ALT 12  ALKPHOS 98  BILITOT 0.5  PROT 6.5  ALBUMIN  2.6*   CBG: Recent Labs  Lab 01/25/24 2009  GLUCAP 88    Discharge time spent: greater than 30 minutes.  Signed: Elidia Toribio Furnace, MD [facility] 01/30/2024

## 2024-01-30 NOTE — TOC Transition Note (Signed)
 Transition of Care [facility]) - Discharge Note   Patient Details  Name: [name] MRN: [id number] Date of Birth: 14-Mar-1961  Transition of Care [facility]) CM/SW Contact:  Roxie KANDICE Stain, RN Phone Number: 01/30/2024, 10:02 AM   Clinical Narrative:    Spoke to patient regarding transition needs. Patient stated her agreement with Dr. Arrien is for her to return to  ED every Tuesday for dialysis due to her being noncompliant and no OP dialysis centers will accept her. .  Patient states she has applied for medicare part B and will call [facility] for an apt once has been approved.  Patient requesting taxi voucher to ex-husbands house to get her belongings-Discharge lounge will provide.    Final next level of care: Other (comment) ([employer]) Barriers to Discharge: Barriers Resolved   Patient Goals and CMS Choice Patient states their goals for this hospitalization and ongoing recovery are:: go to motel          Discharge Placement                 [employer]      Discharge Plan and Services Additional resources added to the After Visit Summary for                                       Social Drivers of Health (SDOH) Interventions SDOH Screenings   Food Insecurity: Food Insecurity Present (01/26/2024)  Housing: High Risk (01/26/2024)  Transportation Needs: Unmet Transportation Needs (01/26/2024)  Utilities: At Risk (01/26/2024)  Alcohol  Screen: Low Risk  (02/10/2023)  Depression (PHQ2-9): Medium Risk (06/11/2023)  Financial Resource Strain: High Risk (02/10/2023)  Physical Activity: Inactive (02/10/2023)  Social Connections: Socially Isolated (06/12/2023)  Tobacco Use: High Risk (01/25/2024)  Health Literacy: Adequate Health Literacy (02/10/2023)     Readmission Risk Interventions    01/26/2024    4:49 PM 01/14/2024    9:38 AM 07/01/2023   12:39 PM  Readmission Risk Prevention Plan  Transportation Screening Complete Complete Complete  Medication Review  (RN Care Manager) Complete Referral to Pharmacy Referral to Pharmacy  PCP or Specialist appointment within 3-5 days of discharge  Complete Complete  HRI or Home Care Consult Complete Complete Complete  SW Recovery Care/Counseling Consult  Complete Complete  Palliative Care Screening Not Applicable Not Applicable Not Applicable  Skilled Nursing Facility Not Applicable Not Applicable Not Applicable

## 2024-01-30 NOTE — Plan of Care (Signed)

## 2024-02-03 ENCOUNTER — Encounter (HOSPITAL_COMMUNITY): Payer: Self-pay

## 2024-02-03 ENCOUNTER — Emergency Department (HOSPITAL_COMMUNITY)
Admission: EM | Admit: 2024-02-03 | Discharge: 2024-02-04 | Disposition: A | Discharge status: Home / Self Care | Payer: Self-pay | Attending: Emergency Medicine | Admitting: Emergency Medicine

## 2024-02-03 ENCOUNTER — Emergency Department (HOSPITAL_COMMUNITY): Payer: Self-pay

## 2024-02-03 ENCOUNTER — Other Ambulatory Visit: Payer: Self-pay

## 2024-02-03 DIAGNOSIS — Z7951 Long term (current) use of inhaled steroids: Secondary | ICD-10-CM | POA: Insufficient documentation

## 2024-02-03 DIAGNOSIS — I132 Hypertensive heart and chronic kidney disease with heart failure and with stage 5 chronic kidney disease, or end stage renal disease: Secondary | ICD-10-CM | POA: Insufficient documentation

## 2024-02-03 DIAGNOSIS — Z992 Dependence on renal dialysis: Secondary | ICD-10-CM | POA: Insufficient documentation

## 2024-02-03 DIAGNOSIS — L97309 Non-pressure chronic ulcer of unspecified ankle with unspecified severity: Secondary | ICD-10-CM

## 2024-02-03 DIAGNOSIS — J449 Chronic obstructive pulmonary disease, unspecified: Secondary | ICD-10-CM | POA: Insufficient documentation

## 2024-02-03 DIAGNOSIS — Z79899 Other long term (current) drug therapy: Secondary | ICD-10-CM | POA: Insufficient documentation

## 2024-02-03 DIAGNOSIS — D649 Anemia, unspecified: Secondary | ICD-10-CM | POA: Insufficient documentation

## 2024-02-03 DIAGNOSIS — N186 End stage renal disease: Secondary | ICD-10-CM | POA: Insufficient documentation

## 2024-02-03 DIAGNOSIS — E1122 Type 2 diabetes mellitus with diabetic chronic kidney disease: Secondary | ICD-10-CM | POA: Insufficient documentation

## 2024-02-03 DIAGNOSIS — L97329 Non-pressure chronic ulcer of left ankle with unspecified severity: Secondary | ICD-10-CM | POA: Insufficient documentation

## 2024-02-03 DIAGNOSIS — I509 Heart failure, unspecified: Secondary | ICD-10-CM | POA: Insufficient documentation

## 2024-02-03 LAB — COMPREHENSIVE METABOLIC PANEL WITH GFR
ALT: 23 U/L (ref 0–44)
AST: 45 U/L — ABNORMAL HIGH (ref 15–41)
Albumin: 2.8 g/dL — ABNORMAL LOW (ref 3.5–5.0)
Alkaline Phosphatase: 108 U/L (ref 38–126)
Anion gap: 16 — ABNORMAL HIGH (ref 5–15)
BUN: 36 mg/dL — ABNORMAL HIGH (ref 8–23)
CO2: 22 mmol/L (ref 22–32)
Calcium: 8 mg/dL — ABNORMAL LOW (ref 8.9–10.3)
Chloride: 91 mmol/L — ABNORMAL LOW (ref 98–111)
Creatinine, Ser: 5.34 mg/dL — ABNORMAL HIGH (ref 0.44–1.00)
GFR, Estimated: 8 mL/min — ABNORMAL LOW (ref >60)
Glucose, Bld: 120 mg/dL — ABNORMAL HIGH (ref 70–99)
Potassium: 4.5 mmol/L (ref 3.5–5.1)
Sodium: 129 mmol/L — ABNORMAL LOW (ref 135–145)
Total Bilirubin: 0.7 mg/dL (ref 0.0–1.2)
Total Protein: 6.4 g/dL — ABNORMAL LOW (ref 6.5–8.1)

## 2024-02-03 LAB — CBC WITH DIFFERENTIAL/PLATELET
Abs Immature Granulocytes: 0.05 K/uL (ref 0.00–0.07)
Basophils Absolute: 0.1 K/uL (ref 0.0–0.1)
Basophils Relative: 1 %
Eosinophils Absolute: 0.6 K/uL — ABNORMAL HIGH (ref 0.0–0.5)
Eosinophils Relative: 6 %
HCT: 31.7 % — ABNORMAL LOW (ref 36.0–46.0)
Hemoglobin: 10 g/dL — ABNORMAL LOW (ref 12.0–15.0)
Immature Granulocytes: 1 %
Lymphocytes Relative: 13 %
Lymphs Abs: 1.3 K/uL (ref 0.7–4.0)
MCH: 31.3 pg (ref 26.0–34.0)
MCHC: 31.5 g/dL (ref 30.0–36.0)
MCV: 99.1 fL (ref 80.0–100.0)
Monocytes Absolute: 0.8 K/uL (ref 0.1–1.0)
Monocytes Relative: 8 %
Neutro Abs: 7.2 K/uL (ref 1.7–7.7)
Neutrophils Relative %: 71 %
Platelets: 346 K/uL (ref 150–400)
RBC: 3.2 MIL/uL — ABNORMAL LOW (ref 3.87–5.11)
RDW: 17.2 % — ABNORMAL HIGH (ref 11.5–15.5)
WBC: 10 K/uL (ref 4.0–10.5)
nRBC: 0 % (ref 0.0–0.2)

## 2024-02-03 MED ORDER — CHLORHEXIDINE GLUCONATE CLOTH 2 % EX PADS: 6.0000 | MEDICATED_PAD | Freq: Every day | CUTANEOUS | Status: DC

## 2024-02-03 NOTE — ED Triage Notes (Signed)
 Pt to er, pt states that she is here to get dialysis, states that she only comes when she gets sick, states that she doesn't have a normal scheduled, but comes about every tuesday

## 2024-02-03 NOTE — ED Notes (Signed)
 Patient transported to dialysis

## 2024-02-03 NOTE — ED Notes (Signed)
 Called back to triage for labs. No response to name x2.

## 2024-02-03 NOTE — ED Provider Triage Note (Signed)
 Emergency Medicine Provider Triage Evaluation Note  [name] , a 63 y.o. female  was evaluated in triage.  Pt complains of right ankle pain/ulcer -- xray ordered. Also here for Ed Alysis.  I did reach out to nephrology Dr. Geralynn who put her in for ED dialysis.  Basic labs are ordered.  Review of Systems  Positive: Mild SOB Negative: Chest pain  Physical Exam  BP 126/73   Pulse 85   Temp 98.7 F (37.1 C) (Oral)   Resp 16   Ht 5' 7 (1.702 m)   Wt 52.2 kg   SpO2 100%   BMI 18.01 kg/m  Gen:   Awake, no distress   Resp:  Normal effort  MSK:   Moves extremities without difficulty  Other:    Medical Decision Making  Medically screening exam initiated at 4:47 PM.  Appropriate orders placed.  [name] was informed that the remainder of the evaluation will be completed by another provider, this initial triage assessment does not replace that evaluation, and the importance of remaining in the ED until their evaluation is complete.     Shermon Warren SAILOR, PA-C 02/03/24 [phone]

## 2024-02-03 NOTE — ED Notes (Signed)
 Called back for repeat troponin. No response to name being called x2.

## 2024-02-03 NOTE — ED Triage Notes (Signed)
 PT here for dialysis. States she doesn't get it on a regular schedule but she comes here to get it and had it last on Friday.  She endorses some SOB but does  not appear in distress at this time.

## 2024-02-03 NOTE — Progress Notes (Signed)
 Asked to see this patient for hospital dialysis. Pt is homeless and without an outpatient HD unit. Labs pending, SOB but not requiring O2 here. Not in distress. The plan will be for ED HD. Pt is not to be admitted at this time. Pt will go to the dialysis unit when they are ready for the patient. When dialysis is completed pt will be sent back to ED for reassessment.   Vitals:   02/03/24 1525 02/03/24 1541  BP: 126/73   Pulse: 85   Resp: 16   Temp: 98.7 F (37.1 C)   TempSrc: Oral   SpO2: 100%   Weight:  52.2 kg  Height:  5' 7 (1.702 m)   From 2024 -->  3h  B350 Heparin  none  RUA AVF     Myer Fret MD  [facility] 02/03/2024, 4:38 PM    Recent Labs  Lab 01/28/24 0253 01/29/24 0426 01/29/24 0830  HGB  --   --  9.4*  CALCIUM  7.4* 7.7*  --   CREATININE 2.99* 3.64*  --   K 4.0 4.1  --    No results for input(s): IRON , TIBC, FERRITIN in the last 168 hours. Inpatient medications:  [START ON 02/04/2024] Chlorhexidine  Gluconate Cloth  6 each Topical Q0600

## 2024-02-03 NOTE — ED Notes (Signed)
 Called x 2 no answer at

## 2024-02-04 MED ORDER — ACETAMINOPHEN 325 MG PO TABS
650.0000 mg | ORAL_TABLET | Freq: Once | ORAL | Status: AC
  Administered 2024-02-04: 650 mg via ORAL
  Filled 2024-02-04: qty 2

## 2024-02-04 MED ORDER — ACETAMINOPHEN 325 MG PO TABS: 650.0000 mg | ORAL_TABLET | Freq: Four times a day (QID) | ORAL | 0 refills | Status: DC | PRN
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
## 2024-02-04 NOTE — ED Provider Notes (Signed)
 " [place] [facility] Provider Note   CSN: [id number] Arrival date & time: 02/03/24  1519     Patient presents with: Vascular Access Problem   [name] is a 63 y.o. 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 Patient has not taken any medications recently for the pain. Symptoms have been constant x2 months - no acute worsening of ulcer today. She states the pain radiates up to the knee. She has no other complaints at this time and received dialysis today.  Denies recent fever, chest pain, SOB, nausea, vomiting, diarrhea.   HPI     Prior to Admission medications   Medication Sig Start Date End Date Taking? Authorizing Provider  acetaminophen  (TYLENOL ) 325 MG tablet Take 2 tablets (650 mg total) by mouth every 6 (six) hours as needed for mild pain (pain score 1-3). 02/04/24   Hoy Nidia FALCON, PA-C  albuterol  (VENTOLIN  HFA) 108 (90 Base) MCG/ACT inhaler Inhale 2 puffs into the lungs every 6 (six) hours as needed for wheezing or shortness of breath. 01/30/24   Arrien, Elidia Sieving, MD  amLODipine  (NORVASC ) 10 MG tablet Take 1 tablet (10 mg total) by mouth daily. 01/30/24   Arrien, Elidia Sieving, MD  atorvastatin  (LIPITOR) 10 MG tablet Take 1 tablet (10 mg total) by mouth daily. 01/30/24   Arrien, Elidia Sieving, MD  calcitRIOL  (ROCALTROL ) 0.25 MCG capsule Take 1 capsule (0.25 mcg total) by mouth daily. 01/30/24   Arrien, Mauricio Daniel, MD  carvedilol  (COREG ) 6.25 MG tablet Take 1 tablet (6.25 mg total) by mouth 2 (two) times daily with a meal. 01/30/24   Arrien, Elidia Sieving, MD  gabapentin  (NEURONTIN ) 100 MG capsule Take 1 capsule (100 mg total) by mouth 2 (two) times daily for 60 doses. 01/30/24 02/29/24  Arrien, Mauricio Daniel, MD  pantoprazole  (PROTONIX )  40 MG tablet Take 1 tablet (40 mg total) by mouth 2 (two) times daily. 01/30/24 02/29/24  Arrien, Elidia Sieving, MD  sodium bicarbonate  650 MG tablet Take 2 tablets (1,300 mg total) by mouth 2 (two) times daily. 01/30/24   Arrien, Mauricio Daniel, MD  torsemide  (DEMADEX ) 100 MG tablet Take 1 tablet (100 mg total) by mouth daily. 01/30/24 02/29/24  Arrien, Mauricio Daniel, MD  TRELEGY ELLIPTA  100-62.5-25 MCG/ACT AEPB Inhale 1 puff into the lungs daily. 01/30/24   Arrien, Elidia Sieving, MD    Allergies: Zestril  [lisinopril ]    Review of Systems  Musculoskeletal:        Ankle pain    Updated Vital Signs BP (!) 149/72   Pulse 72   Temp 98.1 F (36.7 C)   Resp 18   Ht 5' 7 (1.702 m)   Wt 52.2 kg   SpO2 99%   BMI 18.01 kg/m   Physical Exam Vitals and nursing note reviewed.  Constitutional:      General: She is not in acute distress.    Appearance: She is not ill-appearing or toxic-appearing.  HENT:     Head: Normocephalic and atraumatic.     Mouth/Throat:     Mouth: Mucous membranes are moist.  Eyes:     General: No scleral icterus.       Right eye: No discharge.        Left eye: No discharge.     Conjunctiva/sclera: Conjunctivae normal.  Cardiovascular:     Rate and Rhythm: Normal rate and regular rhythm.     Pulses: Normal pulses.     Heart sounds: Normal heart sounds. No murmur heard. Pulmonary:     Effort: Pulmonary effort is normal. No respiratory distress.     Breath sounds: Normal breath sounds. No wheezing, rhonchi or rales.  Musculoskeletal:     Right lower leg: No edema.     Left lower leg: No edema.     Comments: Right ankle: small ulceration on medial malleolus.  No obvious swelling, increased warmth, purulence.  +2 pedal pulse.  Sensation to light touch intact.  Area nontense.  Skin:    General: Skin is warm and dry.     Findings: No rash.  Neurological:     General: No focal deficit present.     Mental Status: She is alert and oriented to person,  place, and time. Mental status is at baseline.  Psychiatric:        Mood and Affect: Mood normal.     (all labs ordered are listed, but only abnormal results are displayed) Labs Reviewed  CBC WITH DIFFERENTIAL/PLATELET - Abnormal; Notable for the following components:      Result Value   RBC 3.20 (*)    Hemoglobin 10.0 (*)    HCT 31.7 (*)    RDW 17.2 (*)    Eosinophils Absolute 0.6 (*)    All other components within normal limits  COMPREHENSIVE METABOLIC PANEL WITH GFR - Abnormal; Notable for the following components:   Sodium 129 (*)    Chloride 91 (*)    Glucose, Bld 120 (*)    BUN 36 (*)    Creatinine, Ser 5.34 (*)    Calcium  8.0 (*)    Total Protein 6.4 (*)    Albumin  2.8 (*)    AST 45 (*)    GFR, Estimated 8 (*)    Anion gap 16 (*)    All other components within normal limits    EKG: None  Radiology: DG Ankle Complete Right Result Date: 02/03/2024 CLINICAL DATA:  Ulcer over the medial ankle. EXAM: RIGHT ANKLE - COMPLETE 3+ VIEW COMPARISON:  Right ankle radiograph dated 11/10/2013. FINDINGS: There is no acute fracture or dislocation. The bones are osteopenic. The ankle mortise is intact. Ulceration of the medial ankle with overlying dressing. No radiopaque foreign object or soft gastric IMPRESSION: 1. No acute fracture or dislocation. 2. Ulceration of the medial ankle. Electronically Signed   By: Vanetta Chou M.D.   On: 02/03/2024 16:46   DG Chest 2 View Result Date: 02/03/2024 CLINICAL DATA:  Dialysis patient. EXAM: CHEST - 2 VIEW COMPARISON:  Chest radiograph dated 01/25/2024. FINDINGS: Bilateral pleural effusions, left greater than right, and relatively similar to prior radiograph. There are bibasilar atelectasis. Pneumonia is not excluded. No pneumothorax. Stable cardiac silhouette. No acute osseous pathology. IMPRESSION: Bilateral pleural effusions, left greater than right, and bibasilar atelectasis. Electronically Signed   By: Vanetta Chou M.D.   On:  02/03/2024 16:45     Procedures   Medications Ordered in the ED  Chlorhexidine  Gluconate Cloth 2 % PADS 6 each (has no administration in time range)  acetaminophen  (TYLENOL ) tablet 650 mg (has no administration in time range)                                    Medical Decision Making Amount and/or Complexity of  Data Reviewed Labs: ordered. Radiology: ordered.  Risk OTC drugs.   This patient presents to the ED for concern of ankle pain, this involves an extensive number of treatment options, and is a complaint that carries with it a high risk of complications and morbidity.  The differential diagnosis includes hemarthrosis, gout, septic joint, fracture, tendonitis, carpal tunnel syndrome, muscle strain, bursitis, compartment syndrome   Co morbidities that complicate the patient evaluation  ESRD on dialysis, COPD, CHF, DM, OA, polysubstance use disorder, GERD, HLD, HTN    Additional history obtained:  Triage provider was able to consult with nephrologist to get patient into dialysis while she was in the waiting room.  Dialysis was successful with 3L drained.   Problem List / ED Course / Critical interventions / Medication management  Patient presents to ED concern for right ankle pain associated with her chronic ulcer on the medial malleolus.  Physical exam not concerning for severe/emergent infectious process. I Ordered, and personally interpreted labs.  CBC without leukocytosis or anemia.  There is mild anemia with hemoglobin at 10.0.  Prior to dialysis, CMP with mild hyponatremia 129 and chloride is mildly low at 91. Patient stating that she tolerated dialysis well and is not having any symptoms such as chest pain or shortness of breath at this time. I ordered imaging studies including chest x-ray, ankle x-ray. I independently visualized and interpreted imaging. I agree with the radiologist interpretation.  Chest x-ray showing bilateral pleural effusions which was taken  prior to dialysis session.  Ankle x-ray without acute osseous abnormalities.  Shared results with patient.  Answered all questions.  Overall, wound does not appear to be obviously infected. I am also reassured that vitals are stable and CBC is without leukocytosis. Recommended patient follow-up with the wound center.  Provided patient with contact information for the wound center at [facility].  Patient agreeable to plan.  Patient's dressing was changed and I will prescribe her Tylenol  to help with pain. I have reviewed the patients home medicines and have made adjustments as needed The patient has been appropriately medically screened and/or stabilized in the ED. I have low suspicion for any other emergent medical condition which would require further screening, evaluation or treatment in the ED or require inpatient management. At time of discharge the patient is hemodynamically stable and in no acute distress. I have discussed work-up results and diagnosis with patient and answered all questions. Patient is agreeable with discharge plan. We discussed strict return precautions for returning to the emergency department and they verbalized understanding.      Social Determinants of Health:  none      Final diagnoses:  ESRD (end stage renal disease) on dialysis (HCC)  Skin ulcer of medial malleolar area of ankle [facility])    ED Discharge Orders          Ordered    acetaminophen  (TYLENOL ) 325 MG tablet  Every 6 hours PRN        02/04/24 0314               Hoy Nidia FALCON, PA-C 02/04/24 0330    Mesner, Selinda, MD 03/13/24 [phone]  "

## 2024-02-04 NOTE — ED Notes (Signed)
 Dialysis reported they were able to get 3L removed and the session total was 3 hours and 15 minutes.

## 2024-02-04 NOTE — Progress Notes (Signed)
   02/04/24 0117  Vitals  Temp 98.8 F (37.1 C)  BP (!) 148/69  Pulse Rate 77  Resp 16  Oxygen  Therapy  SpO2 95 %  O2 Device Room Air  Patient Activity (if Appropriate) In bed  Pulse Oximetry Type Continuous  Oximetry Probe Site Changed No  Post Treatment  Dialyzer Clearance Clear  Liters Processed 71.8  Fluid Removed (mL) 3000 mL  Tolerated HD Treatment Yes  AVG/AVF Arterial Site Held (minutes) 8 minutes  AVG/AVF Venous Site Held (minutes) 5 minutes   Pt tolerated tx, VSS post

## 2024-02-04 NOTE — Discharge Instructions (Addendum)
 As discussed, you will need to follow-up with wound care.  Their contact information is listed in this discharge paperwork.  Seek emergency care if experiencing any new or worsening symptoms.

## 2024-03-10 ENCOUNTER — Encounter (HOSPITAL_COMMUNITY): Payer: Self-pay

## 2024-03-10 ENCOUNTER — Emergency Department (HOSPITAL_COMMUNITY)

## 2024-03-10 ENCOUNTER — Inpatient Hospital Stay (HOSPITAL_COMMUNITY)
Admission: EM | Admit: 2024-03-10 | Discharge: 2024-03-15 | DRG: 640 | Disposition: A | Discharge status: Home / Self Care | Attending: Internal Medicine | Admitting: Internal Medicine

## 2024-03-10 ENCOUNTER — Other Ambulatory Visit: Payer: Self-pay

## 2024-03-10 DIAGNOSIS — Z5900 Homelessness unspecified: Secondary | ICD-10-CM

## 2024-03-10 DIAGNOSIS — F17210 Nicotine dependence, cigarettes, uncomplicated: Secondary | ICD-10-CM | POA: Diagnosis present

## 2024-03-10 DIAGNOSIS — J441 Chronic obstructive pulmonary disease with (acute) exacerbation: Principal | ICD-10-CM | POA: Diagnosis present

## 2024-03-10 DIAGNOSIS — R188 Other ascites: Secondary | ICD-10-CM | POA: Diagnosis present

## 2024-03-10 DIAGNOSIS — N186 End stage renal disease: Secondary | ICD-10-CM | POA: Diagnosis present

## 2024-03-10 DIAGNOSIS — E871 Hypo-osmolality and hyponatremia: Secondary | ICD-10-CM | POA: Diagnosis present

## 2024-03-10 DIAGNOSIS — Z91158 Patient's noncompliance with renal dialysis for other reason: Secondary | ICD-10-CM

## 2024-03-10 DIAGNOSIS — Z1152 Encounter for screening for COVID-19: Secondary | ICD-10-CM | POA: Diagnosis not present

## 2024-03-10 DIAGNOSIS — I5032 Chronic diastolic (congestive) heart failure: Secondary | ICD-10-CM | POA: Diagnosis present

## 2024-03-10 DIAGNOSIS — E1169 Type 2 diabetes mellitus with other specified complication: Secondary | ICD-10-CM | POA: Diagnosis present

## 2024-03-10 DIAGNOSIS — Z681 Body mass index (BMI) 19 or less, adult: Secondary | ICD-10-CM

## 2024-03-10 DIAGNOSIS — Z79899 Other long term (current) drug therapy: Secondary | ICD-10-CM

## 2024-03-10 DIAGNOSIS — E8770 Fluid overload, unspecified: Secondary | ICD-10-CM | POA: Diagnosis present

## 2024-03-10 DIAGNOSIS — F419 Anxiety disorder, unspecified: Secondary | ICD-10-CM | POA: Diagnosis present

## 2024-03-10 DIAGNOSIS — E1142 Type 2 diabetes mellitus with diabetic polyneuropathy: Secondary | ICD-10-CM | POA: Diagnosis present

## 2024-03-10 DIAGNOSIS — K219 Gastro-esophageal reflux disease without esophagitis: Secondary | ICD-10-CM | POA: Diagnosis present

## 2024-03-10 DIAGNOSIS — D631 Anemia in chronic kidney disease: Secondary | ICD-10-CM | POA: Diagnosis present

## 2024-03-10 DIAGNOSIS — E1122 Type 2 diabetes mellitus with diabetic chronic kidney disease: Secondary | ICD-10-CM | POA: Diagnosis present

## 2024-03-10 DIAGNOSIS — J9601 Acute respiratory failure with hypoxia: Secondary | ICD-10-CM | POA: Diagnosis present

## 2024-03-10 DIAGNOSIS — R0602 Shortness of breath: Secondary | ICD-10-CM | POA: Diagnosis present

## 2024-03-10 DIAGNOSIS — E43 Unspecified severe protein-calorie malnutrition: Secondary | ICD-10-CM | POA: Diagnosis present

## 2024-03-10 DIAGNOSIS — N2581 Secondary hyperparathyroidism of renal origin: Secondary | ICD-10-CM | POA: Diagnosis present

## 2024-03-10 DIAGNOSIS — G8929 Other chronic pain: Secondary | ICD-10-CM

## 2024-03-10 DIAGNOSIS — Z66 Do not resuscitate: Secondary | ICD-10-CM | POA: Diagnosis present

## 2024-03-10 DIAGNOSIS — Z833 Family history of diabetes mellitus: Secondary | ICD-10-CM

## 2024-03-10 DIAGNOSIS — Z8616 Personal history of COVID-19: Secondary | ICD-10-CM

## 2024-03-10 DIAGNOSIS — Z992 Dependence on renal dialysis: Secondary | ICD-10-CM

## 2024-03-10 DIAGNOSIS — M25571 Pain in right ankle and joints of right foot: Secondary | ICD-10-CM | POA: Diagnosis present

## 2024-03-10 DIAGNOSIS — K5900 Constipation, unspecified: Secondary | ICD-10-CM | POA: Diagnosis present

## 2024-03-10 DIAGNOSIS — K7460 Unspecified cirrhosis of liver: Secondary | ICD-10-CM | POA: Diagnosis present

## 2024-03-10 DIAGNOSIS — I5033 Acute on chronic diastolic (congestive) heart failure: Secondary | ICD-10-CM | POA: Diagnosis present

## 2024-03-10 DIAGNOSIS — I132 Hypertensive heart and chronic kidney disease with heart failure and with stage 5 chronic kidney disease, or end stage renal disease: Secondary | ICD-10-CM | POA: Diagnosis present

## 2024-03-10 DIAGNOSIS — J449 Chronic obstructive pulmonary disease, unspecified: Secondary | ICD-10-CM

## 2024-03-10 DIAGNOSIS — R079 Chest pain, unspecified: Secondary | ICD-10-CM

## 2024-03-10 DIAGNOSIS — I10 Essential (primary) hypertension: Secondary | ICD-10-CM | POA: Diagnosis present

## 2024-03-10 DIAGNOSIS — F1994 Other psychoactive substance use, unspecified with psychoactive substance-induced mood disorder: Secondary | ICD-10-CM | POA: Diagnosis present

## 2024-03-10 DIAGNOSIS — E785 Hyperlipidemia, unspecified: Secondary | ICD-10-CM

## 2024-03-10 DIAGNOSIS — E7849 Other hyperlipidemia: Secondary | ICD-10-CM | POA: Diagnosis present

## 2024-03-10 LAB — CBC
HCT: 28.6 % — ABNORMAL LOW (ref 36.0–46.0)
HCT: 27.3 % — ABNORMAL LOW (ref 36.0–46.0)
Hemoglobin: 9.3 g/dL — ABNORMAL LOW (ref 12.0–15.0)
Hemoglobin: 8.9 g/dL — ABNORMAL LOW (ref 12.0–15.0)
MCH: 31.5 pg (ref 26.0–34.0)
MCH: 31.3 pg (ref 26.0–34.0)
MCHC: 32.5 g/dL (ref 30.0–36.0)
MCHC: 32.6 g/dL (ref 30.0–36.0)
MCV: 96.9 fL (ref 80.0–100.0)
MCV: 96.1 fL (ref 80.0–100.0)
Platelets: 306 K/uL (ref 150–400)
Platelets: 306 K/uL (ref 150–400)
RBC: 2.95 MIL/uL — ABNORMAL LOW (ref 3.87–5.11)
RBC: 2.84 MIL/uL — ABNORMAL LOW (ref 3.87–5.11)
RDW: 15.5 % (ref 11.5–15.5)
RDW: 15.5 % (ref 11.5–15.5)
WBC: 7.1 K/uL (ref 4.0–10.5)
WBC: 8.5 K/uL (ref 4.0–10.5)
nRBC: 0 % (ref 0.0–0.2)
nRBC: 0 % (ref 0.0–0.2)

## 2024-03-10 LAB — TROPONIN T, HIGH SENSITIVITY
Troponin T High Sensitivity: 57 ng/L — ABNORMAL HIGH (ref 0–19)
Troponin T High Sensitivity: 52 ng/L — ABNORMAL HIGH (ref 0–19)
Troponin T High Sensitivity: 56 ng/L — ABNORMAL HIGH (ref 0–19)

## 2024-03-10 LAB — RESP PANEL BY RT-PCR (RSV, FLU A&B, COVID)  RVPGX2
Influenza A by PCR: NEGATIVE
Influenza B by PCR: NEGATIVE
Resp Syncytial Virus by PCR: NEGATIVE
SARS Coronavirus 2 by RT PCR: NEGATIVE

## 2024-03-10 LAB — AMMONIA: Ammonia: 23 umol/L (ref 9–35)

## 2024-03-10 LAB — HEPATIC FUNCTION PANEL
ALT: 28 U/L (ref 0–44)
AST: 35 U/L (ref 15–41)
Albumin: 3.8 g/dL (ref 3.5–5.0)
Alkaline Phosphatase: 129 U/L — ABNORMAL HIGH (ref 38–126)
Bilirubin, Direct: <0.1 mg/dL (ref 0.0–0.2)
Total Bilirubin: 0.2 mg/dL (ref 0.0–1.2)
Total Protein: 7 g/dL (ref 6.5–8.1)

## 2024-03-10 LAB — BASIC METABOLIC PANEL WITH GFR
Anion gap: 15 (ref 5–15)
BUN: 56 mg/dL — ABNORMAL HIGH (ref 8–23)
CO2: 14 mmol/L — ABNORMAL LOW (ref 22–32)
Calcium: 8 mg/dL — ABNORMAL LOW (ref 8.9–10.3)
Chloride: 106 mmol/L (ref 98–111)
Creatinine, Ser: 5.74 mg/dL — ABNORMAL HIGH (ref 0.44–1.00)
GFR, Estimated: 8 mL/min — ABNORMAL LOW
Glucose, Bld: 101 mg/dL — ABNORMAL HIGH (ref 70–99)
Potassium: 4.4 mmol/L (ref 3.5–5.1)
Sodium: 134 mmol/L — ABNORMAL LOW (ref 135–145)

## 2024-03-10 LAB — ACETAMINOPHEN LEVEL: Acetaminophen (Tylenol), Serum: <10 ug/mL — ABNORMAL LOW (ref 10–30)

## 2024-03-10 LAB — PROTIME-INR
INR: 1.1 (ref 0.8–1.2)
Prothrombin Time: 14.4 s (ref 11.4–15.2)

## 2024-03-10 LAB — GLUCOSE, CAPILLARY: Glucose-Capillary: 156 mg/dL — ABNORMAL HIGH (ref 70–99)

## 2024-03-10 LAB — CBG MONITORING, ED
Glucose-Capillary: 93 mg/dL (ref 70–99)
Glucose-Capillary: 87 mg/dL (ref 70–99)

## 2024-03-10 LAB — PRO BRAIN NATRIURETIC PEPTIDE: Pro Brain Natriuretic Peptide: >35000 pg/mL — ABNORMAL HIGH

## 2024-03-10 LAB — CREATININE, SERUM
Creatinine, Ser: 5.69 mg/dL — ABNORMAL HIGH (ref 0.44–1.00)
GFR, Estimated: 8 mL/min — ABNORMAL LOW

## 2024-03-10 LAB — HEMOGLOBIN A1C
Hgb A1c MFr Bld: 4.9 % (ref 4.8–5.6)
Mean Plasma Glucose: 93.93 mg/dL

## 2024-03-10 LAB — HEPATITIS B SURFACE ANTIGEN: Hepatitis B Surface Ag: NONREACTIVE

## 2024-03-10 MED ORDER — HYDROCOD POLI-CHLORPHE POLI ER 10-8 MG/5ML PO SUER
5.0000 mL | Freq: Two times a day (BID) | ORAL | Status: DC | PRN
  Administered 2024-03-12 – 2024-03-13 (×2): 5 mL via ORAL
  Filled 2024-03-10 (×2): qty 5

## 2024-03-10 MED ORDER — ALBUTEROL SULFATE (2.5 MG/3ML) 0.083% IN NEBU: 2.5000 mg | INHALATION_SOLUTION | Freq: Four times a day (QID) | RESPIRATORY_TRACT | Status: DC | PRN

## 2024-03-10 MED ORDER — ONDANSETRON HCL 4 MG PO TABS: 4.0000 mg | ORAL_TABLET | Freq: Four times a day (QID) | ORAL | Status: DC | PRN

## 2024-03-10 MED ORDER — BUDESONIDE 0.25 MG/2ML IN SUSP
0.2500 mg | Freq: Two times a day (BID) | RESPIRATORY_TRACT | Status: DC
  Administered 2024-03-10 – 2024-03-11 (×3): 0.25 mg via RESPIRATORY_TRACT
  Filled 2024-03-10 (×3): qty 2

## 2024-03-10 MED ORDER — FUROSEMIDE 10 MG/ML IJ SOLN
100.0000 mg | Freq: Once | INTRAVENOUS | Status: AC
  Administered 2024-03-10: 100 mg via INTRAVENOUS
  Filled 2024-03-10: qty 10

## 2024-03-10 MED ORDER — IPRATROPIUM-ALBUTEROL 0.5-2.5 (3) MG/3ML IN SOLN
3.0000 mL | Freq: Four times a day (QID) | RESPIRATORY_TRACT | Status: DC
  Administered 2024-03-10 – 2024-03-11 (×3): 3 mL via RESPIRATORY_TRACT
  Filled 2024-03-10 (×4): qty 3

## 2024-03-10 MED ORDER — GABAPENTIN 100 MG PO CAPS
100.0000 mg | ORAL_CAPSULE | Freq: Two times a day (BID) | ORAL | Status: DC
  Administered 2024-03-10 – 2024-03-13 (×7): 100 mg via ORAL
  Filled 2024-03-10 (×6): qty 1

## 2024-03-10 MED ORDER — HEPARIN SODIUM (PORCINE) 1000 UNIT/ML DIALYSIS
20.0000 [IU]/kg | INTRAMUSCULAR | Status: DC | PRN
  Administered 2024-03-10: 1000 [IU] via INTRAVENOUS_CENTRAL
  Filled 2024-03-10: qty 1

## 2024-03-10 MED ORDER — PENTAFLUOROPROP-TETRAFLUOROETH EX AERO: 1.0000 | INHALATION_SPRAY | CUTANEOUS | Status: DC | PRN

## 2024-03-10 MED ORDER — LIDOCAINE HCL (PF) 1 % IJ SOLN: 5.0000 mL | INTRAMUSCULAR | Status: DC | PRN

## 2024-03-10 MED ORDER — PANTOPRAZOLE SODIUM 40 MG PO TBEC
40.0000 mg | DELAYED_RELEASE_TABLET | Freq: Two times a day (BID) | ORAL | Status: DC
  Administered 2024-03-10 – 2024-03-15 (×11): 40 mg via ORAL
  Filled 2024-03-10 (×10): qty 1

## 2024-03-10 MED ORDER — INSULIN ASPART 100 UNIT/ML IJ SOLN: 0.0000 [IU] | Freq: Every day | INTRAMUSCULAR | Status: DC

## 2024-03-10 MED ORDER — ALTEPLASE 2 MG IJ SOLR: 2.0000 mg | Freq: Once | INTRAMUSCULAR | Status: DC | PRN

## 2024-03-10 MED ORDER — CHLORHEXIDINE GLUCONATE CLOTH 2 % EX PADS
6.0000 | MEDICATED_PAD | Freq: Every day | CUTANEOUS | Status: DC
  Administered 2024-03-11 – 2024-03-15 (×5): 6 via TOPICAL

## 2024-03-10 MED ORDER — PENTAFLUOROPROP-TETRAFLUOROETH EX AERO
INHALATION_SPRAY | CUTANEOUS | Status: AC
  Filled 2024-03-10: qty 30

## 2024-03-10 MED ORDER — METHYLPREDNISOLONE SODIUM SUCC 40 MG IJ SOLR
40.0000 mg | INTRAMUSCULAR | Status: DC
  Administered 2024-03-10 – 2024-03-11 (×2): 40 mg via INTRAVENOUS
  Filled 2024-03-10 (×2): qty 1

## 2024-03-10 MED ORDER — CALCITRIOL 0.25 MCG PO CAPS
0.2500 ug | ORAL_CAPSULE | Freq: Every day | ORAL | Status: DC
  Administered 2024-03-11 – 2024-03-15 (×5): 0.25 ug via ORAL
  Filled 2024-03-10 (×5): qty 1

## 2024-03-10 MED ORDER — MORPHINE SULFATE (PF) 2 MG/ML IV SOLN
2.0000 mg | INTRAVENOUS | Status: DC | PRN
  Administered 2024-03-10 – 2024-03-12 (×7): 2 mg via INTRAVENOUS
  Filled 2024-03-10 (×7): qty 1

## 2024-03-10 MED ORDER — ATORVASTATIN CALCIUM 10 MG PO TABS
10.0000 mg | ORAL_TABLET | Freq: Every day | ORAL | Status: DC
  Administered 2024-03-10 – 2024-03-15 (×6): 10 mg via ORAL
  Filled 2024-03-10 (×5): qty 1

## 2024-03-10 MED ORDER — NITROGLYCERIN 0.4 MG SL SUBL
0.4000 mg | SUBLINGUAL_TABLET | SUBLINGUAL | Status: DC | PRN
  Administered 2024-03-13 (×2): 0.4 mg via SUBLINGUAL
  Filled 2024-03-10 (×2): qty 1

## 2024-03-10 MED ORDER — LIDOCAINE-PRILOCAINE 2.5-2.5 % EX CREA: 1.0000 | TOPICAL_CREAM | CUTANEOUS | Status: DC | PRN

## 2024-03-10 MED ORDER — SODIUM BICARBONATE 650 MG PO TABS
1300.0000 mg | ORAL_TABLET | Freq: Two times a day (BID) | ORAL | Status: DC
  Administered 2024-03-10 – 2024-03-15 (×11): 1300 mg via ORAL
  Filled 2024-03-10 (×10): qty 2

## 2024-03-10 MED ORDER — ANTICOAGULANT SODIUM CITRATE 4% (200MG/5ML) IV SOLN: 5.0000 mL | Status: DC | PRN

## 2024-03-10 MED ORDER — TRAZODONE HCL 50 MG PO TABS
25.0000 mg | ORAL_TABLET | Freq: Every evening | ORAL | Status: DC | PRN
  Administered 2024-03-10 – 2024-03-13 (×3): 25 mg via ORAL
  Filled 2024-03-10 (×3): qty 1

## 2024-03-10 MED ORDER — ACETAMINOPHEN 325 MG PO TABS
650.0000 mg | ORAL_TABLET | Freq: Four times a day (QID) | ORAL | Status: DC | PRN
  Administered 2024-03-10 – 2024-03-15 (×3): 650 mg via ORAL
  Filled 2024-03-10 (×2): qty 2

## 2024-03-10 MED ORDER — HEPARIN SODIUM (PORCINE) 1000 UNIT/ML DIALYSIS: 1000.0000 [IU] | INTRAMUSCULAR | Status: DC | PRN

## 2024-03-10 MED ORDER — AMLODIPINE BESYLATE 10 MG PO TABS
10.0000 mg | ORAL_TABLET | Freq: Every day | ORAL | Status: DC
  Administered 2024-03-10 – 2024-03-14 (×5): 10 mg via ORAL
  Filled 2024-03-10 (×4): qty 1
  Filled 2024-03-10: qty 2

## 2024-03-10 MED ORDER — ACETAMINOPHEN 325 MG PO TABS
ORAL_TABLET | ORAL | Status: AC
  Filled 2024-03-10: qty 2

## 2024-03-10 MED ORDER — CARVEDILOL 6.25 MG PO TABS
6.2500 mg | ORAL_TABLET | Freq: Two times a day (BID) | ORAL | Status: DC
  Administered 2024-03-10 – 2024-03-14 (×8): 6.25 mg via ORAL
  Filled 2024-03-10 (×7): qty 1
  Filled 2024-03-10: qty 2

## 2024-03-10 MED ORDER — GUAIFENESIN ER 600 MG PO TB12
600.0000 mg | ORAL_TABLET | Freq: Two times a day (BID) | ORAL | Status: DC
  Administered 2024-03-10 – 2024-03-15 (×11): 600 mg via ORAL
  Filled 2024-03-10 (×10): qty 1

## 2024-03-10 MED ORDER — ONDANSETRON HCL 4 MG/2ML IJ SOLN
4.0000 mg | Freq: Four times a day (QID) | INTRAMUSCULAR | Status: DC | PRN
  Administered 2024-03-14: 4 mg via INTRAVENOUS
  Filled 2024-03-10: qty 2

## 2024-03-10 MED ORDER — MAGNESIUM HYDROXIDE 400 MG/5ML PO SUSP: 30.0000 mL | Freq: Every day | ORAL | Status: DC | PRN

## 2024-03-10 MED ORDER — FUROSEMIDE 10 MG/ML IJ SOLN
10.0000 mg/h | INTRAVENOUS | Status: DC
  Administered 2024-03-10: 10 mg/h via INTRAVENOUS
  Filled 2024-03-10: qty 20

## 2024-03-10 MED ORDER — ALBUTEROL SULFATE (2.5 MG/3ML) 0.083% IN NEBU: 2.5000 mg | INHALATION_SOLUTION | RESPIRATORY_TRACT | Status: DC | PRN

## 2024-03-10 MED ORDER — INSULIN ASPART 100 UNIT/ML IJ SOLN
0.0000 [IU] | Freq: Three times a day (TID) | INTRAMUSCULAR | Status: DC
  Administered 2024-03-11 – 2024-03-13 (×2): 2 [IU] via SUBCUTANEOUS
  Filled 2024-03-10 (×2): qty 2

## 2024-03-10 MED ORDER — ACETAMINOPHEN 650 MG RE SUPP: 650.0000 mg | Freq: Four times a day (QID) | RECTAL | Status: DC | PRN

## 2024-03-10 MED ORDER — HEPARIN SODIUM (PORCINE) 5000 UNIT/ML IJ SOLN
5000.0000 [IU] | Freq: Three times a day (TID) | INTRAMUSCULAR | Status: DC
  Administered 2024-03-10 – 2024-03-15 (×15): 5000 [IU] via SUBCUTANEOUS
  Filled 2024-03-10 (×14): qty 1

## 2024-03-10 NOTE — Assessment & Plan Note (Addendum)
 Continue bronchodilator therapy   No further wheezing  Patient was placed on bronchodilator therapy and inhaled corticosteroids.  She had a short course of systemic steroids.   She had airway clearing techniques with flutter valve and incentive spirometer Pneumonia has been ruled out.

## 2024-03-10 NOTE — Assessment & Plan Note (Addendum)
 Volume overload due to ESRD Acute on chronic diastolic heart failure  Today with signs of volume overload   Continue amlodipine  and carvedilol .  Loop diuretic with torsemide  100 mg daily.  Plan for HD tomorrow with ultrafiltration

## 2024-03-10 NOTE — ED Notes (Signed)
 Report given to dialysis.

## 2024-03-10 NOTE — H&P (Addendum)
 "     [place]   PATIENT NAMEGarlene [name]    MR#:  [id number]  DATE OF BIRTH:  1960/09/29  DATE OF ADMISSION:  03/10/2024  PRIMARY CARE PHYSICIAN: Pcp, No   Patient is coming from: Home  REQUESTING/REFERRING PHYSICIAN: Wanita Bruckner, MD  CHIEF COMPLAINT:   Chief Complaint  Patient presents with   Shortness of Breath   Chest Pain    HISTORY OF PRESENT ILLNESS:  [name] is a 63 y.o. Caucasian female with medical history significant for ESRD on hemodialysis with noncompliance for the last month, diastolic CHF, COPD, tobacco abuse, anxiety, asthma, polysubstance abuse including cocaine and cannabis, hypertension and dyslipidemia and type 2 diabetes mellitus, who presented to the ER acute onset of worsening dyspnea despite use of nebulizer therapy at home with associated cough productive of thick whitish sputum and wheezing for the last 3 to 4 days.  She has also been using her maintenance inhaler.  She was given albuterol  by EMS with some improvement.  She has been having right posterior chest and rib pain which has been worsening with movement or cough.  No fever or chills.  No nausea or vomiting or abdominal pain.  She makes some urine and denies any dysuria or hematuria or flank pain.  ED Course: When she came to the ER, BP was 165/87 with otherwise normal vital signs.  Labs revealed mild hyponatremia 134, CO2 of 14 and blood glucose of 101 with BUN of 56 and creatinine 5.7.  Alk phos was 129 with otherwise unremarkable CMP.  High sensitive troponin I was 51 and later 52.  CBC showed hemoglobin 9.3 hematocrit 28.6 close to previous levels.  Coag profile was normal and Tylenol  level was less than 10 EKG as reviewed by me : EKG showed normal sinus rhythm with a rate of 81 with prolonged QT interval with QTc of 504 MS. Imaging: 2 view chest x-ray showed the following: 1. Moderate left and small right pleural effusions similar to prior. 2. Atelectasis or pneumonia in the  lung bases.  The patient was placed on IV Lasix  drip.  Dr. Rayburn with nephrology was contacted and is aware about the patient.  He will arrange for him with hospice.  Patient will be admitted to a telemetry after further evaluation and management. PAST MEDICAL HISTORY:   Past Medical History:  Diagnosis Date   Acute on chronic blood loss anemia 08/01/2022   Acute on chronic diastolic CHF (congestive heart failure) (HCC) 08/12/2022   Acute on chronic respiratory failure with hypoxia (HCC) 05/16/2023   Acute pulmonary edema (HCC) 05/16/2023   Acute seasonal allergic rhinitis due to pollen 02/22/2016   Alcohol  abuse    Allergy    Anxiety    Arteriovenous fistula infection, initial encounter 04/16/2023   Arthritis    Asthma    Cannabis abuse    Cocaine abuse (HCC)    COPD (chronic obstructive pulmonary disease) (HCC)    COPD with acute exacerbation (HCC) 10/25/2022   COVID 05/16/2023   Depression    Drug addiction (HCC)    GERD (gastroesophageal reflux disease)    GIB (gastrointestinal bleeding) 08/19/2019   Heart murmur    Heme positive stool 08/14/2022   Homelessness    Hx of adenomatous colonic polyps    Hyperlipidemia    Hypertension    pt stated every once in a while BP will be high but has not been prescribed medication for HTN.    PFO (patent foramen ovale)    ?  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) (HCC) 02/22/2016   SIRS (systemic inflammatory response syndrome) (HCC) 10/25/2022    PAST SURGICAL HISTORY:   Past Surgical History:  Procedure Laterality Date   A/V FISTULAGRAM Right 01/31/2023   Procedure: A/V Fistulagram;  Surgeon: Gretta Lonni PARAS, MD;  Location: [facility];  Service: Cardiovascular;  Laterality: Right;    AV FISTULA PLACEMENT Right 08/14/2022   Procedure: RIGHT ARM BRACHIOBASILIC ATERIOVENOUS FISTULA CREATION;  Surgeon: Magda Debby SAILOR, MD;  Location: [facility] OR;  Service: Vascular;  Laterality: Right;   BASCILIC VEIN TRANSPOSITION Right 11/13/2022   Procedure: RIGHT ARM SECOND STAGE BASILIC VEIN TRANSPOSITION;  Surgeon: Magda Debby SAILOR, MD;  Location: [facility] OR;  Service: Vascular;  Laterality: Right;   BIOPSY  01/01/2019   Procedure: BIOPSY;  Surgeon: Albertus Gordy HERO, MD;  Location: [facility] ENDOSCOPY;  Service: Endoscopy;;   BIOPSY  11/17/2021   Procedure: BIOPSY;  Surgeon: Charlanne Groom, MD;  Location: [facility] ENDOSCOPY;  Service: Gastroenterology;;   CESAREAN SECTION  1989   COLONOSCOPY  11/07/2020   2018   CYSTOSCOPY W/ URETERAL STENT PLACEMENT Left 08/01/2018   Procedure: CYSTOSCOPY WITH RETROGRADE PYELOGRAM/URETERAL STENT PLACEMENT;  Surgeon: Carolee Sherwood JONETTA DOUGLAS, MD;  Location: [facility] ORS;  Service: Urology;  Laterality: Left;   CYSTOSCOPY WITH RETROGRADE PYELOGRAM, URETEROSCOPY AND STENT PLACEMENT Left 01/15/2019   Procedure: CYSTOSCOPY WITH RETROGRADE PYELOGRAM, URETEROSCOPY AND STENT PLACEMENT;  Surgeon: Carolee Sherwood JONETTA DOUGLAS, MD;  Location: [facility] ORS;  Service: Urology;  Laterality: Left;   ENTEROSCOPY N/A 08/16/2022   Procedure: ENTEROSCOPY;  Surgeon: Wilhelmenia Aloha Raddle., MD;  Location: [facility] ENDOSCOPY;  Service: Gastroenterology;  Laterality: N/A;   ENTEROSCOPY N/A 10/31/2022   Procedure: ENTEROSCOPY;  Surgeon: Albertus Gordy HERO, MD;  Location: [facility] ENDOSCOPY;  Service: Gastroenterology;  Laterality: N/A;   ENTEROSCOPY N/A 05/09/2023   Procedure: ENTEROSCOPY;  Surgeon: Federico Rosario BROCKS, MD;  Location: [facility] ENDOSCOPY;  Service: Gastroenterology;  Laterality: N/A;   ENTEROSCOPY N/A 09/09/2023   Procedure: ENTEROSCOPY;  Surgeon: Federico Rosario BROCKS, MD;  Location: [facility] ENDOSCOPY;  Service: Gastroenterology;  Laterality: N/A;   ENTEROSCOPY N/A 10/26/2023   Procedure: ENTEROSCOPY;  Surgeon: Albertus Gordy HERO, MD;  Location: [facility] ENDOSCOPY;   Service: Gastroenterology;  Laterality: N/A;   ESOPHAGOGASTRODUODENOSCOPY (EGD) WITH PROPOFOL  N/A 01/01/2019   Procedure: ESOPHAGOGASTRODUODENOSCOPY (EGD) WITH PROPOFOL ;  Surgeon: Albertus Gordy HERO, MD;  Location: [facility] ENDOSCOPY;  Service: Endoscopy;  Laterality: N/A;   ESOPHAGOGASTRODUODENOSCOPY (EGD) WITH PROPOFOL  N/A 08/21/2019   Procedure: ESOPHAGOGASTRODUODENOSCOPY (EGD) WITH PROPOFOL ;  Surgeon: Shila Gustav GAILS, MD;  Location: [facility] ENDOSCOPY;  Service: Endoscopy;  Laterality: N/A;   ESOPHAGOGASTRODUODENOSCOPY (EGD) WITH PROPOFOL  N/A 11/17/2021   Procedure: ESOPHAGOGASTRODUODENOSCOPY (EGD) WITH PROPOFOL ;  Surgeon: Charlanne Groom, MD;  Location: [facility] ENDOSCOPY;  Service: Gastroenterology;  Laterality: N/A;   FRACTURE SURGERY Left 2011   arm   HEMOSTASIS CLIP PLACEMENT  08/16/2022   Procedure: HEMOSTASIS CLIP PLACEMENT;  Surgeon: Wilhelmenia Aloha Raddle., MD;  Location: [facility] ENDOSCOPY;  Service: Gastroenterology;;   HOT HEMOSTASIS N/A 08/16/2022   Procedure: HOT HEMOSTASIS (ARGON PLASMA COAGULATION/BICAP);  Surgeon: Wilhelmenia Aloha Raddle., MD;  Location: [facility] ENDOSCOPY;  Service: Gastroenterology;  Laterality: N/A;   HOT HEMOSTASIS N/A 10/31/2022   Procedure: HOT HEMOSTASIS (ARGON PLASMA COAGULATION/BICAP);  Surgeon: Albertus,  Gordy HERO, MD;  Location: [facility] ENDOSCOPY;  Service: Gastroenterology;  Laterality: N/A;   HOT HEMOSTASIS N/A 05/09/2023   Procedure: HOT HEMOSTASIS (ARGON PLASMA COAGULATION/BICAP);  Surgeon: Federico Rosario BROCKS, MD;  Location: [facility] ENDOSCOPY;  Service: Gastroenterology;  Laterality: N/A;   HOT HEMOSTASIS N/A 09/09/2023   Procedure: EGD, WITH ARGON PLASMA COAGULATION;  Surgeon: Federico Rosario BROCKS, MD;  Location: [facility] ENDOSCOPY;  Service: Gastroenterology;  Laterality: N/A;   INSERTION OF DIALYSIS CATHETER Right 08/14/2022   Procedure: INSERTION OF TUNNELED DIALYSIS CATHETER USING PALINDROME CATHETER KIT 19CM;  Surgeon: Magda Debby SAILOR, MD;  Location: [facility] OR;  Service: Vascular;  Laterality: Right;   IR  PARACENTESIS  01/02/2024   IR THORACENTESIS ASP PLEURAL SPACE W/IMG GUIDE  09/15/2023   POLYPECTOMY  08/16/2022   Procedure: POLYPECTOMY;  Surgeon: Mansouraty, Aloha Raddle., MD;  Location: [facility] ENDOSCOPY;  Service: Gastroenterology;;   SUBMUCOSAL TATTOO INJECTION  08/16/2022   Procedure: SUBMUCOSAL TATTOO INJECTION;  Surgeon: Wilhelmenia Aloha Raddle., MD;  Location: [facility] ENDOSCOPY;  Service: Gastroenterology;;   UPPER GASTROINTESTINAL ENDOSCOPY      SOCIAL HISTORY:   Social History   Tobacco Use   Smoking status: Every Day    Current packs/day: 1.00    Types: Cigarettes    Passive exposure: Past   Smokeless tobacco: Never  Substance Use Topics   Alcohol  use: Not Currently    Comment: 1 month PTA    FAMILY HISTORY:   Family History  Problem Relation Age of Onset   Diabetes Father    Colon cancer Neg Hx    Esophageal cancer Neg Hx    Rectal cancer Neg Hx    Stomach cancer Neg Hx     DRUG ALLERGIES:  Allergies[1]  REVIEW OF SYSTEMS:   ROS As per history of present illness. All pertinent systems were reviewed above. Constitutional, HEENT, cardiovascular, respiratory, GI, GU, musculoskeletal, neuro, psychiatric, endocrine, integumentary and hematologic systems were reviewed and are otherwise negative/unremarkable except for positive findings mentioned above in the HPI.   MEDICATIONS AT HOME:   Prior to Admission medications  Medication Sig Start Date End Date Taking? Authorizing Provider  acetaminophen  (TYLENOL ) 325 MG tablet Take 2 tablets (650 mg total) by mouth every 6 (six) hours as needed for mild pain (pain score 1-3). 02/04/24   Hoy Nidia FALCON, PA-C  albuterol  (VENTOLIN  HFA) [id number] Base) MCG/ACT inhaler Inhale 2 puffs into the lungs every 6 (six) hours as needed for wheezing or shortness of breath. 01/30/24   Arrien, Elidia Sieving, MD  amLODipine  (NORVASC ) 10 MG tablet Take 1 tablet (10 mg total) by mouth daily. 01/30/24   Arrien, Elidia Sieving, MD  atorvastatin   (LIPITOR) 10 MG tablet Take 1 tablet (10 mg total) by mouth daily. 01/30/24   Arrien, Mauricio Daniel, MD  calcitRIOL  (ROCALTROL ) 0.25 MCG capsule Take 1 capsule (0.25 mcg total) by mouth daily. 01/30/24   Arrien, Mauricio Daniel, MD  carvedilol  (COREG ) 6.25 MG tablet Take 1 tablet (6.25 mg total) by mouth 2 (two) times daily with a meal. 01/30/24   Arrien, Elidia Sieving, MD  gabapentin  (NEURONTIN ) 100 MG capsule Take 1 capsule (100 mg total) by mouth 2 (two) times daily for 60 doses. 01/30/24 02/29/24  Arrien, Mauricio Daniel, MD  pantoprazole  (PROTONIX ) 40 MG tablet Take 1 tablet (40 mg total) by mouth 2 (two) times daily. 01/30/24 02/29/24  Arrien, Elidia Sieving, MD  sodium bicarbonate  650 MG tablet Take 2 tablets (1,300 mg total) by mouth 2 (two) times daily. 01/30/24  Arrien, Elidia Sieving, MD  torsemide  (DEMADEX ) 100 MG tablet Take 1 tablet (100 mg total) by mouth daily. 01/30/24 02/29/24  Arrien, Mauricio Daniel, MD  TRELEGY ELLIPTA  100-62.5-25 MCG/ACT AEPB Inhale 1 puff into the lungs daily. 01/30/24   Arrien, Elidia Sieving, MD      VITAL SIGNS:  Blood pressure (!) 192/90, pulse 77, temperature 98.1 F (36.7 C), temperature source Oral, resp. rate 16, height 5' 7 (1.702 m), weight 52.2 kg, SpO2 99%.  PHYSICAL EXAMINATION:  Physical Exam  GENERAL:  63 y.o.-year-old Caucasian female patient lying in the bed with mild distress from pain and cough.  EYES: Pupils equal, round, reactive to light and accommodation. No scleral icterus. Extraocular muscles intact.  HEENT: Head atraumatic, normocephalic. Oropharynx and nasopharynx clear.  NECK:  Supple, no jugular venous distention. No thyroid enlargement, no tenderness.  LUNGS: Diminished bibasilar and right midlung zone breath sounds with bibasilar rales. No use of accessory muscles of respiration.  CARDIOVASCULAR: Regular rate and rhythm, S1, S2 normal. No murmurs, rubs, or gallops.  ABDOMEN: Soft, nondistended, nontender. Bowel  sounds present. No organomegaly or mass.  EXTREMITIES: 1+ bilateral lower extremity pitting edema, with no cyanosis, or clubbing.  NEUROLOGIC: Cranial nerves II through XII are intact. Muscle strength 5/5 in all extremities. Sensation intact. Gait not checked.  PSYCHIATRIC: The patient is alert and oriented x 3.  Normal affect and good eye contact. SKIN: No obvious rash, lesion, or ulcer.   LABORATORY PANEL:   CBC Recent Labs  Lab 03/10/24 0055  WBC 7.1  HGB 9.3*  HCT 28.6*  PLT 306   ------------------------------------------------------------------------------------------------------------------  Chemistries  Recent Labs  Lab 03/10/24 0055 03/10/24 0203  NA 134*  --   K 4.4  --   CL 106  --   CO2 14*  --   GLUCOSE 101*  --   BUN 56*  --   CREATININE 5.74*  --   CALCIUM  8.0*  --   AST  --  35  ALT  --  28  ALKPHOS  --  129*  BILITOT  --  0.2   ------------------------------------------------------------------------------------------------------------------  Cardiac Enzymes No results for input(s): TROPONINI in the last 168 hours. ------------------------------------------------------------------------------------------------------------------  RADIOLOGY:  DG Chest 2 View Result Date: 03/10/2024 EXAM: 2 VIEW(S) XRAY OF THE CHEST 03/10/2024 01:08:00 AM COMPARISON: 02/03/2024 CLINICAL HISTORY: SOB, chest pain FINDINGS: LUNGS AND PLEURA: Moderate left pleural effusion. Small right pleural effusion. Bibasilar opacities. Pulmonary vascular congestion. No pneumothorax. HEART AND MEDIASTINUM: No acute abnormality of the cardiac and mediastinal silhouettes. BONES AND SOFT TISSUES: No acute osseous abnormality. IMPRESSION: 1. Moderate left and small right pleural effusions similar to prior. 2. Atelectasis or pneumonia in the lung bases. Electronically signed by: Norman Gatlin MD 03/10/2024 01:12 AM EST RP Workstation: [id number]      IMPRESSION AND PLAN:  Assessment and  Plan: Fluid overload - This is associated with end-stage renal disease on hemodialysis with noncompliance. - She has subsequent acute on chronic diastolic CHF.  Last EF was 60 to 65% on 10/23/2023 with grade 2 diastolic dysfunction, moderate left atrial dilatation.. -Patient will be admitted to a telemetry bed. - Will continue diuresis with IV Lasix  drip. - Nephrology consult will be obtained for hemodialysis. - Dr. Rayburn was notified about patient.  COPD with acute exacerbation (HCC) - We will place her on IV Solu-Medrol  and bronchodilator therapy with scheduled and as needed DuoNebs as well as mucolytic therapy. - Will add IV Rocephin .  Chest pain, unspecified - I suspected  atypical musculoskeletal etiology despite mild Elevation of her troponin T that is likely due to demand ischemia. - Will follow serial troponins and place on as needed IV morphine  sulfate and sublingual nitroglycerin .   Essential hypertension - We will continue antihypertensive therapy.  GERD without esophagitis - Will continue PPI therapy.  Type 2 diabetes mellitus with peripheral neuropathy (HCC) - The patient will be placed on supplemental coverage with NovoLog . - Will continue Neurontin .  Dyslipidemia - Will continue statin therapy.   DVT prophylaxis: SQ heparin . Advanced Care Planning:  Code Status: She is DNR and DNI. Family Communication:  The plan of care was discussed in details with the patient (and family). I answered all questions. The patient agreed to proceed with the above mentioned plan. Further management will depend upon hospital course. Disposition Plan: Back to previous home environment Consults called: Nephrology All the records are reviewed and case discussed with ED provider.  Status is: Inpatient  At the time of the admission, it appears that the appropriate admission status for this patient is inpatient.  This is judged to be reasonable and necessary in order to provide the  required intensity of service to ensure the patient's safety given the presenting symptoms, physical exam findings and initial radiographic and laboratory data in the context of comorbid conditions.  The patient requires inpatient status due to high intensity of service, high risk of further deterioration and high frequency of surveillance required.  I certify that at the time of admission, it is my clinical judgment that the patient will require inpatient hospital care extending more than 2 midnights.                            Dispo: The patient is from: Home              Anticipated d/c is to: Home              Patient currently is not medically stable to d/c.              Difficult to place patient: No  Madison DELENA Peaches M.D on 03/10/2024 at 6:55 AM  [facility]   From 7 PM-7 AM, contact night-coverage [email or web address]  CC: Primary care physician; Pcp, No     [1]  Allergies Allergen Reactions   Zestril  [Lisinopril ] Other (See Comments)    Hyperkalemia   "

## 2024-03-10 NOTE — Progress Notes (Signed)
 Heart Failure Navigator Progress Note  Assessed for Heart & Vascular TOC clinic readiness.  Patient does not meet criteria due to ESRD on hemodialysis. No HF TOC.   Navigator will sign off at this time.   Randie Bustle, BSN, Scientist, clinical (histocompatibility and immunogenetics) Only

## 2024-03-10 NOTE — ED Provider Notes (Addendum)
 " [place] EMERGENCY DEPARTMENT AT [facility] Provider Note   CSN: [id number] Arrival date & time: 03/10/24  0031     Patient presents with: Shortness of Breath and Chest Pain   [name] is a 63 y.o. female.   Patient presents to the emergency department for evaluation of multiple problems.  Patient primarily came in tonight because of shortness of breath.  She reports that she has COPD and continues to smoke.  She has used nebulizers in the past but someone stole her nebulizer.  She has been using her maintenance inhalers.  Patient woke up tonight with shortness of breath, was given albuterol  by EMS with some improvement.  Patient reports pain in the right posterior ribs and chest area that is tender to the touch, hurts to move and also cough.  She has had a lot of thick secretions when she coughs.  Patient reports that she has a dialysis patient, sees [facility] but does not currently have a dialysis center.  She has not had dialysis in a month.  She does make urine daily.  Patient reports that she was recently told she has liver problems and she has significant abdominal distention.  She reports that she has had to have paracentesis in the past, it has been a couple of months.  Patient reports that initially her abdominal distention improved with dialysis, but now it is not responding.       Prior to Admission medications  Medication Sig Start Date End Date Taking? Authorizing Provider  acetaminophen  (TYLENOL ) 325 MG tablet Take 2 tablets (650 mg total) by mouth every 6 (six) hours as needed for mild pain (pain score 1-3). 02/04/24   Hoy Nidia FALCON, PA-C  albuterol  (VENTOLIN  HFA) 108 (90 Base) MCG/ACT inhaler Inhale 2 puffs into the lungs every 6 (six) hours as needed for wheezing or shortness of breath. 01/30/24   Arrien, Elidia Sieving, MD  amLODipine  (NORVASC ) 10 MG tablet Take 1 tablet (10 mg total) by mouth daily. 01/30/24   Arrien, Elidia Sieving, MD   atorvastatin  (LIPITOR) 10 MG tablet Take 1 tablet (10 mg total) by mouth daily. 01/30/24   Arrien, Mauricio Daniel, MD  calcitRIOL  (ROCALTROL ) 0.25 MCG capsule Take 1 capsule (0.25 mcg total) by mouth daily. 01/30/24   Arrien, Mauricio Daniel, MD  carvedilol  (COREG ) 6.25 MG tablet Take 1 tablet (6.25 mg total) by mouth 2 (two) times daily with a meal. 01/30/24   Arrien, Elidia Sieving, MD  gabapentin  (NEURONTIN ) 100 MG capsule Take 1 capsule (100 mg total) by mouth 2 (two) times daily for 60 doses. 01/30/24 02/29/24  Arrien, Mauricio Daniel, MD  pantoprazole  (PROTONIX ) 40 MG tablet Take 1 tablet (40 mg total) by mouth 2 (two) times daily. 01/30/24 02/29/24  Arrien, Elidia Sieving, MD  sodium bicarbonate  650 MG tablet Take 2 tablets (1,300 mg total) by mouth 2 (two) times daily. 01/30/24   Arrien, Mauricio Daniel, MD  torsemide  (DEMADEX ) 100 MG tablet Take 1 tablet (100 mg total) by mouth daily. 01/30/24 02/29/24  Arrien, Mauricio Daniel, MD  TRELEGY ELLIPTA  100-62.5-25 MCG/ACT AEPB Inhale 1 puff into the lungs daily. 01/30/24   Arrien, Elidia Sieving, MD    Allergies: Zestril  [lisinopril ]    Review of Systems  Updated Vital Signs BP (!) 188/87   Pulse 82   Temp 97.8 F (36.6 C) (Oral)   Resp (!) 23   Ht 5' 7 (1.702 m)   Wt 52.2 kg   SpO2 99%  BMI 18.02 kg/m   Physical Exam Vitals and nursing note reviewed.  Constitutional:      Appearance: She is well-developed. She is ill-appearing.  HENT:     Head: Normocephalic and atraumatic.     Mouth/Throat:     Mouth: Mucous membranes are moist.  Eyes:     General: Vision grossly intact. Gaze aligned appropriately.     Extraocular Movements: Extraocular movements intact.     Conjunctiva/sclera: Conjunctivae normal.  Cardiovascular:     Rate and Rhythm: Normal rate and regular rhythm.     Pulses: Normal pulses.     Heart sounds: Normal heart sounds, S1 normal and S2 normal. No murmur heard.    No friction rub. No gallop.   Pulmonary:     Effort: Pulmonary effort is normal. No respiratory distress.     Breath sounds: Decreased breath sounds present.  Abdominal:     General: Abdomen is protuberant. Bowel sounds are normal. There is distension.     Palpations: Abdomen is soft.     Tenderness: There is no abdominal tenderness. There is no guarding or rebound.     Hernia: No hernia is present.  Musculoskeletal:        General: No swelling.     Cervical back: Full passive range of motion without pain, normal range of motion and neck supple. No spinous process tenderness or muscular tenderness. Normal range of motion.     Right lower leg: 1+ Pitting Edema present.     Left lower leg: 1+ Pitting Edema present.  Skin:    General: Skin is warm and dry.     Capillary Refill: Capillary refill takes less than 2 seconds.     Findings: No ecchymosis, erythema, rash or wound.  Neurological:     General: No focal deficit present.     Mental Status: She is alert and oriented to person, place, and time.     GCS: GCS eye subscore is 4. GCS verbal subscore is 5. GCS motor subscore is 6.     Cranial Nerves: Cranial nerves 2-12 are intact.     Sensory: Sensation is intact.     Motor: Motor function is intact.     Coordination: Coordination is intact.  Psychiatric:        Attention and Perception: Attention normal.        Mood and Affect: Mood normal.        Speech: Speech normal.        Behavior: Behavior normal.     (all labs ordered are listed, but only abnormal results are displayed) Labs Reviewed  BASIC METABOLIC PANEL WITH GFR - Abnormal; Notable for the following components:      Result Value   Sodium 134 (*)    CO2 14 (*)    Glucose, Bld 101 (*)    BUN 56 (*)    Creatinine, Ser 5.74 (*)    Calcium  8.0 (*)    GFR, Estimated 8 (*)    All other components within normal limits  CBC - Abnormal; Notable for the following components:   RBC 2.95 (*)    Hemoglobin 9.3 (*)    HCT 28.6 (*)    All other  components within normal limits  PRO BRAIN NATRIURETIC PEPTIDE - Abnormal; Notable for the following components:   Pro Brain Natriuretic Peptide >35,000.0 (*)    All other components within normal limits  HEPATIC FUNCTION PANEL - Abnormal; Notable for the following components:   Alkaline Phosphatase 129 (*)  All other components within normal limits  ACETAMINOPHEN  LEVEL - Abnormal; Notable for the following components:   Acetaminophen  (Tylenol ), Serum <10 (*)    All other components within normal limits  TROPONIN T, HIGH SENSITIVITY - Abnormal; Notable for the following components:   Troponin T High Sensitivity 57 (*)    All other components within normal limits  TROPONIN T, HIGH SENSITIVITY - Abnormal; Notable for the following components:   Troponin T High Sensitivity 52 (*)    All other components within normal limits  RESP PANEL BY RT-PCR (RSV, FLU A&B, COVID)  RVPGX2  PROTIME-INR  AMMONIA    EKG: None  Radiology: DG Chest 2 View Result Date: 03/10/2024 EXAM: 2 VIEW(S) XRAY OF THE CHEST 03/10/2024 01:08:00 AM COMPARISON: 02/03/2024 CLINICAL HISTORY: SOB, chest pain FINDINGS: LUNGS AND PLEURA: Moderate left pleural effusion. Small right pleural effusion. Bibasilar opacities. Pulmonary vascular congestion. No pneumothorax. HEART AND MEDIASTINUM: No acute abnormality of the cardiac and mediastinal silhouettes. BONES AND SOFT TISSUES: No acute osseous abnormality. IMPRESSION: 1. Moderate left and small right pleural effusions similar to prior. 2. Atelectasis or pneumonia in the lung bases. Electronically signed by: Norman Gatlin MD 03/10/2024 01:12 AM EST RP Workstation: [id number]     Procedures   Medications Ordered in the ED  furosemide  (LASIX ) 100 mg in dextrose  5 % 50 mL IVPB (100 mg Intravenous New Bag/Given 03/10/24 0348)                                    Medical Decision Making Amount and/or Complexity of Data Reviewed Labs: ordered. Radiology:  ordered.  Risk Decision regarding hospitalization.   Differential Diagnosis considered includes, but not limited to: COPD exacerbation; Bronchitis; Pneumonia; CHF; ACS; PE  Presents to the emergency department for evaluation of shortness of breath.  Etiology is mixed, patient does have a history of COPD and continued tobacco abuse.  Additionally, however, she has a history of end-stage renal disease and has not dialyzed in 1 month because of housing instability, lack of transportation.  She reports that she does see [facility] but does not currently have a dialysis center.  Patient in no respiratory distress at arrival.  She reports improvement after nebulizer treatment by EMS.  Some of her shortness of breath was likely COPD related, however, she does have evidence of volume overload.  As she does not have a dialysis center, will require hospitalization for dialysis and nephrology consultation.  Additionally, has moderate to large abdominal distention, likely ascites.  No significant tenderness or infectious symptoms that would suggest SBP.  Patient reports that in the past her abdominal distention has improved with dialysis, but she also has required paracentesis in the past.  As patient clearly has some residual renal function, given Lasix .  She is not requiring oxygen  currently.  Will admit to hospitalist service.  Dicussed with Dr Rayburn, nephrology. Will arrange for dialysis.     Final diagnoses:  COPD exacerbation (HCC)  Hypervolemia, unspecified hypervolemia type  Cirrhosis of liver with ascites, unspecified hepatic cirrhosis type [facility])    ED Discharge Orders     None          Haze Lonni PARAS, MD 03/10/24 0403    Haze Lonni PARAS, MD 03/10/24 0424  "

## 2024-03-10 NOTE — Assessment & Plan Note (Addendum)
 Hyponatremia  Improved volume status and respiratory function after inpatient renal replacement therapy with ultrafiltration.   Patient is homeless and she has no HD unit as outpatient due to non compliance.  She gets HD in the hospital.   Continue with po torsemide  for diuresis    Acute hypoxemic respiratory failure, due to volume overload induced pulmonary edema with bilateral pleural effusions.   Oxygenation has improved, 02 saturation 92 to 100% on room air.   Metabolic bone disease continue with calcitriol  and oral bicarbonate

## 2024-03-10 NOTE — Progress Notes (Signed)
 PT Cancellation Note  Patient Details Name: TRENIECE HOLSCLAW MRN: [id number] DOB: 1960/05/23   Cancelled Treatment:    Reason Eval/Treat Not Completed: Patient at procedure or test/unavailable (Pt down to HD at 12:30 today. Will follow up as able and appropriate.)  Dorothyann Maier, DPT, CLT  Acute Rehabilitation Services Office: [phone] (Secure chat preferred)   Dorothyann VEAR Maier 03/10/2024, 2:10 PM

## 2024-03-10 NOTE — Assessment & Plan Note (Addendum)
 Will need follow up as outpatient  Patient is homeless

## 2024-03-10 NOTE — Assessment & Plan Note (Deleted)
-   I suspected atypical musculoskeletal etiology despite mild Elevation of her troponin T that is likely due to demand ischemia. - Will follow serial troponins and place on as needed IV morphine  sulfate and sublingual nitroglycerin .

## 2024-03-10 NOTE — Assessment & Plan Note (Addendum)
 Patient was placed on insulin  sliding scale for glucose cover and monitoring Her glucose remained well controlled with fasting capillary glucose on the day of discharge at 123 mg/dl.  Continue statin therapy  Gabapentin  for neuropathy

## 2024-03-10 NOTE — Assessment & Plan Note (Addendum)
-  Patient was placed on nutritional supplementation.

## 2024-03-10 NOTE — ED Notes (Signed)
 Pt to dialysis.

## 2024-03-10 NOTE — Assessment & Plan Note (Addendum)
 Continue pantoprazole .

## 2024-03-10 NOTE — Assessment & Plan Note (Addendum)
Continue blood pressure control with amlodipine and carvedilol.

## 2024-03-10 NOTE — ED Triage Notes (Signed)
 Pt BIB [facility] from home c/o SOB. Pt woke up around midnight from SOB symptoms. Per EMS, wheezing in R lower lobe. Pt states she has chest pain during inspiration. Pt has not had dialysis for last month due to transportation. R side fistula in place.  5 albuterol  given en route 20G LFA 190/100 HR 83 100% RA

## 2024-03-10 NOTE — Progress Notes (Signed)
" °   03/10/24 1820  Vitals  Temp 98.4 F (36.9 C)  Pulse Rate 71  Resp 20  BP (!) 171/87  SpO2 100 %  O2 Device Nasal Cannula  Weight  (unable to weigh -pt stretcher)  Type of Weight Post-Dialysis  Oxygen  Therapy  O2 Flow Rate (L/min) 2 L/min  Patient Activity (if Appropriate) In bed  Pulse Oximetry Type Continuous  Oximetry Probe Site Changed No  Post Treatment  Dialyzer Clearance Lightly streaked  Hemodialysis Intake (mL) 0 mL  Liters Processed 63.1  Fluid Removed (mL) 4500 mL  Tolerated HD Treatment Yes  AVG/AVF Arterial Site Held (minutes) 15 minutes  AVG/AVF Venous Site Held (minutes) 10 minutes   Received patient in bed to unit.  Alert and oriented.  Informed consent signed and in chart.   TX duration:3.5  Patient tolerated well.  Transported back to the room ---6E02 Alert, without acute distress.  Hand-off given to patient's nurse.   Access used: RUAF---slight increase in arterial hold---15 minutes--to achieve cessation Access issues: see above  Total UF removed: 4500 Medication(s) given: tylenol  650mg  po x 1   [name] "

## 2024-03-10 NOTE — Assessment & Plan Note (Signed)
-   Will continue continue bronchodilator therapy.

## 2024-03-10 NOTE — Progress Notes (Addendum)
 " Progress Note   PatientJAMILYNN [name] FMW:[id number] DOB: 05/13/1960 DOA: 03/10/2024     0 DOS: the patient was seen and examined on 03/10/2024   Brief hospital course: Mrs. Coen was admitted to the hospital with the working diagnosis of volume overload in the setting or ESRD on HD.   63 yo female with the past medical history of ESRD on HD, heart failure, COPD, asthma, polysubstance abuse, hypertension and anxiety who presented with dyspnea. She had worsening symptoms of cough, wheezing, and increased sputum production for the last 3 to 4 das prior to admission, despite using nebulizer treatments at home. Because of severe symptoms EMS was called. She was found in respiratory distress, and was transported to the ED. Patient has no outpatient HD unit and she comes to the hospital for hemodialysis, last session was 03/05/24.   Na 134, K 4.4 CL 106 bicarbonate 14 glucose 101 bun 56 cr 5.74  BNP >35,000 Wbc 7,1 hgb 9.3 plt 306  Sars covid 19 negative Influenza negative RSV negative   Chest radiograph with hyperinflation, right rotation, bilateral hilar vascular congestion, bilateral pleural effusions, more left than right.   EKG 81 bpm, normal axis, qtc 504, sinus rhythm with no significant ST segment or T wave changes.   Assessment and Plan: * ESRD on dialysis [facility]) Patient with volume overload, last HD was 12/19. Patient still making  urine.   Plan for renal replacement therapy per Nephrology recommendations Patient is homeless and she has no HD unit as outpatient due to non compliance.  She gets HD in the hospital.  Patient had furosemide  on admission   Acute hypoxemic respiratory failure, due to volume overload induced pulmonary edema with bilateral pleural effusions.  Continue supplemental 02 per [place]  Ultrafiltration per HD   Metabolic bone disease continue with calcitriol    Chronic diastolic CHF (congestive heart failure) (HCC) Volume overload due to ESRD Acute on chronic  diastolic heart failure  Plan to continue fluid management per HD plus ultrafiltration  Continue blood pressure monitoring   COPD with acute exacerbation (HCC) Continue bronchodilator therapy  Inhaled and systemic corticosteroids Airway clearing techniques with flutter valve and incentive spirometer Out of bed.  Hold on antibiotic therapy, no evidence on bacterial infection/ pneumonia.   Essential hypertension Continue blood pressure monitoring  Continue blood pressure control with amlodipine  and carvedilol .  Type 2 diabetes mellitus with hyperlipidemia (HCC) Continue insulin  sliding scale for glucose cover and monitoring Continue statin therapy  Gabapentin  for neuropathy   GERD without esophagitis Continue pantoprazole    Substance induced mood disorder (HCC) Continue close monitoring Will need follow up as outpatient  Patient is homeless   Homelessness Patient non compliant with outpatient follow up   Protein-calorie malnutrition, severe Consult nutrition for supplementation         Subjective: Patient continue to have cough and wheezing, no chest pain. No edema or orthopnea   Physical Exam: Vitals:   03/10/24 0145 03/10/24 0427 03/10/24 0500 03/10/24 0737  BP: (!) 188/87  (!) 192/90 (!) 168/93  Pulse:   77 86  Resp: (!) 23  16 20   Temp:  98.1 F (36.7 C)  98 F (36.7 C)  TempSrc:  Oral  Oral  SpO2:   99% 100%  Weight:      Height:       Neurology awake and alert, deconditioned ENT with mild pallor with no icterus Cardiovascular with S1 and S2 present and regular with no gallops, rubs or murmurs Respiratory  with bilateral rales and rhonchi, with expiratory wheezing, poor ventilation and prolonged expiratory phase Abdomen with no distention, soft and on tender Trace non pitting lower extremity edema   Data Reviewed:    Family Communication: no family at the bedside   Disposition: Status is: Inpatient Remains inpatient appropriate because: COPD  exacerbation   Planned Discharge Destination: Home     Author: Elidia Toribio Furnace, MD 03/10/2024 10:36 AM  For on call review [email or web address].  "

## 2024-03-10 NOTE — Hospital Course (Addendum)
 [name] was admitted to the hospital with the working diagnosis of volume overload in the setting or ESRD on HD.   63 yo female with the past medical history of ESRD on HD, heart failure, COPD, asthma, polysubstance abuse, hypertension and anxiety who presented with dyspnea. She had worsening symptoms of cough, wheezing, and increased sputum production for the last 3 to 4 das prior to admission, despite using nebulizer treatments at home. Because of severe symptoms EMS was called. She was found in respiratory distress, and was transported to the ED. Patient has no outpatient HD unit and she comes to the hospital for hemodialysis, last session was 03/05/24.   Na 134, K 4.4 CL 106 bicarbonate 14 glucose 101 bun 56 cr 5.74  BNP >35,000 Wbc 7,1 hgb 9.3 plt 306  Sars covid 19 negative Influenza negative RSV negative   Chest radiograph with hyperinflation, right rotation, bilateral hilar vascular congestion, bilateral pleural effusions, more left than right.   EKG 81 bpm, normal axis, qtc 504, sinus rhythm with no significant ST segment or T wave changes.   12/24 hemodialysis with ultrafiltration with good toleration  12/25 improved volume status and respiratory function.  12/26 HD today with good toleration, plan for discharge home tomorrow.  12/27 continue HD as usual in the hospital 12/28 patient having chest pain and signs of pulmonary edema, will plan for HD with ultrafiltration tomorrow prior to discharge.

## 2024-03-10 NOTE — Care Management (Signed)
 Transition of Care [facility]) - Inpatient Brief Assessment   Patient Details  Name: [name] MRN: [id number] Date of Birth: [birth year]  Transition [facility] CM/SW Contact:    Corean JAYSON Canary, RN Phone Number: 03/10/2024, 3:06 PM   Clinical Narrative:  63 year old with ESRD requiring dialysis. The patient has SDOH needs has unstable housing. She  is inconsistent with dialyzing and does not have a clinic at this time. Last had dialysis a month ago on a ED visit. She has COPD and is currently a smoker, was smoking a cigarette in the hospital bathroom today.   Resources provided to AVS for SDOH and PCP . She does not have insurance, she is pending for medicaid.   IPCM will follow  Transition of Care Asessment: Insurance and Status: Selfpay Patient has primary care physician: No   Prior level of function:: Independent Prior/Current Home Services: No current home services Social Drivers of Health Review: SDOH reviewed interventions complete Readmission risk has been reviewed: Yes Transition of care needs: transition of care needs identified, TOC will continue to follow

## 2024-03-10 NOTE — Consult Note (Signed)
 [facility] Renal Consultation Note    Indication for Consultation:  Management of ESRD/hemodialysis, anemia, hypertension/volume, and secondary hyperparathyroidism.  HPI: [name] is a 63 y.o. female ESRD (albeit with residual renal function), COPD, HTN, substance abuse, and homelessness who is being admitted with fluid overload.  Presented to ED with worsened dyspnea, edema, CP, nausea, fatigue for the past few days. Has not had dialysis since she was admitted last (02/04/24). She reports that has been staying with her ex-husband but he has now kicked her out again. Vitals with HTN and hypoxia initially, better with  O2. Labs with Na 134, K 4.4, CO2 14, BUN 56, Cr 5.7, Ca 8, BNP > 35000, Trop 52 -> 58, WBC 7.1, Hgb 9.3, Flu/COVID negative. CXR with B effusions (L>R). EKG without acute ST changes. Given IV Lasix  and admitted.  Our team was consulted for dialysis. Seen in ED bed - visibly uncomfortable. Reports episode of vomiting this AM. + dyspnea and CP. Now on Lasix  drip which is great.  Does not have a HD unit. Last HD as above was 11/19/215. She has a functional RUE AVF.  Past Medical History:  Diagnosis Date   Acute on chronic blood loss anemia 08/01/2022   Acute on chronic diastolic CHF (congestive heart failure) (HCC) 08/12/2022   Acute on chronic respiratory failure with hypoxia (HCC) 05/16/2023   Acute pulmonary edema (HCC) 05/16/2023   Acute seasonal allergic rhinitis due to pollen 02/22/2016   Alcohol  abuse    Allergy    Anxiety    Arteriovenous fistula infection, initial encounter 04/16/2023   Arthritis    Asthma    Cannabis abuse    Cocaine abuse (HCC)    COPD (chronic obstructive pulmonary disease) (HCC)    COPD with acute exacerbation (HCC) 10/25/2022   COVID 05/16/2023   Depression    Drug addiction (HCC)    GERD (gastroesophageal reflux disease)    GIB (gastrointestinal bleeding) 08/19/2019   Heart murmur    Heme positive stool 08/14/2022    Homelessness    Hx of adenomatous colonic polyps    Hyperlipidemia    Hypertension    pt stated every once in a while BP will be high but has not been prescribed medication for HTN.    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) 02/22/2016   SIRS (systemic inflammatory response syndrome) (HCC) 10/25/2022   Past Surgical History:  Procedure Laterality Date   A/V FISTULAGRAM Right 01/31/2023   Procedure: A/V Fistulagram;  Surgeon: Gretta Lonni PARAS, MD;  Location: [facility] INVASIVE CV LAB;  Service: Cardiovascular;  Laterality: Right;   AV FISTULA PLACEMENT Right 08/14/2022   Procedure: RIGHT ARM BRACHIOBASILIC ATERIOVENOUS FISTULA CREATION;  Surgeon: Magda Debby SAILOR, MD;  Location: [facility] OR;  Service: Vascular;  Laterality: Right;   BASCILIC VEIN TRANSPOSITION Right 11/13/2022   Procedure: RIGHT ARM SECOND STAGE BASILIC VEIN TRANSPOSITION;  Surgeon: Magda Debby SAILOR, MD;  Location: [facility] OR;  Service: Vascular;  Laterality: Right;   BIOPSY  01/01/2019   Procedure: BIOPSY;  Surgeon: Albertus Gordy HERO, MD;  Location: [facility] ENDOSCOPY;  Service: Endoscopy;;   BIOPSY  11/17/2021   Procedure: BIOPSY;  Surgeon: Charlanne Groom, MD;  Location: [facility] ENDOSCOPY;  Service: Gastroenterology;;   CESAREAN SECTION  1989   COLONOSCOPY  11/07/2020   2018   CYSTOSCOPY W/ URETERAL STENT PLACEMENT Left 08/01/2018   Procedure: CYSTOSCOPY WITH RETROGRADE PYELOGRAM/URETERAL STENT PLACEMENT;  Surgeon: Carolee Sherwood JONETTA DOUGLAS, MD;  Location: [facility] ORS;  Service: Urology;  Laterality: Left;   CYSTOSCOPY WITH RETROGRADE PYELOGRAM, URETEROSCOPY AND STENT PLACEMENT Left 01/15/2019   Procedure: CYSTOSCOPY WITH RETROGRADE PYELOGRAM, URETEROSCOPY AND STENT PLACEMENT;  Surgeon: Carolee Sherwood JONETTA DOUGLAS, MD;  Location: [facility] ORS;  Service: Urology;  Laterality: Left;   ENTEROSCOPY N/A 08/16/2022   Procedure: ENTEROSCOPY;  Surgeon: Wilhelmenia Aloha Raddle., MD;  Location: [facility] ENDOSCOPY;  Service: Gastroenterology;  Laterality: N/A;   ENTEROSCOPY N/A 10/31/2022   Procedure: ENTEROSCOPY;  Surgeon: Albertus Gordy HERO, MD;  Location: [facility] ENDOSCOPY;  Service: Gastroenterology;  Laterality: N/A;   ENTEROSCOPY N/A 05/09/2023   Procedure: ENTEROSCOPY;  Surgeon: Federico Rosario BROCKS, MD;  Location: [facility] ENDOSCOPY;  Service: Gastroenterology;  Laterality: N/A;   ENTEROSCOPY N/A 09/09/2023   Procedure: ENTEROSCOPY;  Surgeon: Federico Rosario BROCKS, MD;  Location: [facility] ENDOSCOPY;  Service: Gastroenterology;  Laterality: N/A;   ENTEROSCOPY N/A 10/26/2023   Procedure: ENTEROSCOPY;  Surgeon: Albertus Gordy HERO, MD;  Location: [facility] ENDOSCOPY;  Service: Gastroenterology;  Laterality: N/A;   ESOPHAGOGASTRODUODENOSCOPY (EGD) WITH PROPOFOL  N/A 01/01/2019   Procedure: ESOPHAGOGASTRODUODENOSCOPY (EGD) WITH PROPOFOL ;  Surgeon: Albertus Gordy HERO, MD;  Location: [facility] ENDOSCOPY;  Service: Endoscopy;  Laterality: N/A;   ESOPHAGOGASTRODUODENOSCOPY (EGD) WITH PROPOFOL  N/A 08/21/2019   Procedure: ESOPHAGOGASTRODUODENOSCOPY (EGD) WITH PROPOFOL ;  Surgeon: Shila Gustav GAILS, MD;  Location: [facility] ENDOSCOPY;  Service: Endoscopy;  Laterality: N/A;   ESOPHAGOGASTRODUODENOSCOPY (EGD) WITH PROPOFOL  N/A 11/17/2021   Procedure: ESOPHAGOGASTRODUODENOSCOPY (EGD) WITH PROPOFOL ;  Surgeon: Charlanne Groom, MD;  Location: [facility] ENDOSCOPY;  Service: Gastroenterology;  Laterality: N/A;   FRACTURE SURGERY Left 2011   arm   HEMOSTASIS CLIP PLACEMENT  08/16/2022   Procedure: HEMOSTASIS CLIP PLACEMENT;  Surgeon: Wilhelmenia Aloha Raddle., MD;  Location: [facility] ENDOSCOPY;  Service: Gastroenterology;;   HOT HEMOSTASIS N/A 08/16/2022   Procedure: HOT HEMOSTASIS (ARGON PLASMA COAGULATION/BICAP);  Surgeon: Wilhelmenia Aloha Raddle., MD;  Location: [facility] ENDOSCOPY;  Service: Gastroenterology;  Laterality:  N/A;   HOT HEMOSTASIS N/A 10/31/2022   Procedure: HOT HEMOSTASIS (ARGON PLASMA COAGULATION/BICAP);  Surgeon: Albertus Gordy HERO, MD;  Location: [facility] ENDOSCOPY;  Service: Gastroenterology;  Laterality: N/A;   HOT HEMOSTASIS N/A 05/09/2023   Procedure: HOT HEMOSTASIS (ARGON PLASMA COAGULATION/BICAP);  Surgeon: Federico Rosario BROCKS, MD;  Location: [facility] ENDOSCOPY;  Service: Gastroenterology;  Laterality: N/A;   HOT HEMOSTASIS N/A 09/09/2023   Procedure: EGD, WITH ARGON PLASMA COAGULATION;  Surgeon: Federico Rosario BROCKS, MD;  Location: [facility] ENDOSCOPY;  Service: Gastroenterology;  Laterality: N/A;   INSERTION OF DIALYSIS CATHETER Right 08/14/2022   Procedure: INSERTION OF TUNNELED DIALYSIS CATHETER USING PALINDROME CATHETER KIT 19CM;  Surgeon: Magda Debby SAILOR, MD;  Location: [facility] OR;  Service: Vascular;  Laterality: Right;   IR PARACENTESIS  01/02/2024   IR THORACENTESIS ASP PLEURAL SPACE W/IMG GUIDE  09/15/2023   POLYPECTOMY  08/16/2022   Procedure: POLYPECTOMY;  Surgeon: Wilhelmenia Aloha Raddle., MD;  Location: [facility] ENDOSCOPY;  Service: Gastroenterology;;   SUBMUCOSAL TATTOO INJECTION  08/16/2022   Procedure: SUBMUCOSAL TATTOO INJECTION;  Surgeon: Wilhelmenia Aloha Raddle., MD;  Location: [facility] ENDOSCOPY;  Service: Gastroenterology;;   UPPER GASTROINTESTINAL ENDOSCOPY     Family History  Problem Relation Age of Onset   Diabetes Father    Colon cancer Neg Hx    Esophageal cancer  Neg Hx    Rectal cancer Neg Hx    Stomach cancer Neg Hx    Social History:  reports that she has been smoking cigarettes. She has been exposed to tobacco smoke. She has never used smokeless tobacco. She reports that she does not currently use alcohol . She reports current drug use. Drug: Marijuana.  ROS: As per HPI otherwise negative.  Physical Exam: Vitals:   03/10/24 0145 03/10/24 0427 03/10/24 0500 03/10/24 0737  BP: (!) 188/87  (!) 192/90 (!) 168/93  Pulse:   77 86  Resp: (!) 23  16 20   Temp:  98.1 F (36.7 C)  98 F (36.7 C)  TempSrc:  Oral   Oral  SpO2:   99% 100%  Weight:      Height:         General: Chronically ill appearing, mild distress/restless, [place] O2 in place Head: Normocephalic Neck: Supple without lymphadenopathy/masses. JVD elevated. Lungs: Bibasilar rales and scattered wheeze Heart: RRR with normal S1, S2. No murmurs, rubs, or gallops appreciated. Abdomen: Distended and tense Musculoskeletal:  Strength and tone appear normal for age. Lower extremities:2+ BLE tense edema Neuro: Alert and oriented X 3. Moves all extremities spontaneously. Psych:  Responds to questions appropriately with a normal affect. Dialysis Access: RUE AVF +t/b  Allergies[1] Prior to Admission medications  Medication Sig Start Date End Date Taking? Authorizing Provider  acetaminophen  (TYLENOL ) 325 MG tablet Take 2 tablets (650 mg total) by mouth every 6 (six) hours as needed for mild pain (pain score 1-3). 02/04/24   Hoy Nidia FALCON, PA-C  albuterol  (VENTOLIN  HFA) 108 [phone] Base) MCG/ACT inhaler Inhale 2 puffs into the lungs every 6 (six) hours as needed for wheezing or shortness of breath. 01/30/24   Arrien, Elidia Sieving, MD  amLODipine  (NORVASC ) 10 MG tablet Take 1 tablet (10 mg total) by mouth daily. 01/30/24   Arrien, Elidia Sieving, MD  atorvastatin  (LIPITOR) 10 MG tablet Take 1 tablet (10 mg total) by mouth daily. 01/30/24   Arrien, Elidia Sieving, MD  calcitRIOL  (ROCALTROL ) 0.25 MCG capsule Take 1 capsule (0.25 mcg total) by mouth daily. 01/30/24   Arrien, Mauricio Daniel, MD  carvedilol  (COREG ) 6.25 MG tablet Take 1 tablet (6.25 mg total) by mouth 2 (two) times daily with a meal. 01/30/24   Arrien, Elidia Sieving, MD  gabapentin  (NEURONTIN ) 100 MG capsule Take 1 capsule (100 mg total) by mouth 2 (two) times daily for 60 doses. 01/30/24 02/29/24  Arrien, Elidia Sieving, MD  pantoprazole  (PROTONIX ) 40 MG tablet Take 1 tablet (40 mg total) by mouth 2 (two) times daily. 01/30/24 02/29/24  Arrien, Elidia Sieving, MD  sodium  bicarbonate 650 MG tablet Take 2 tablets (1,300 mg total) by mouth 2 (two) times daily. 01/30/24   Arrien, Mauricio Daniel, MD  torsemide  (DEMADEX ) 100 MG tablet Take 1 tablet (100 mg total) by mouth daily. 01/30/24 02/29/24  Arrien, Mauricio Daniel, MD  TRELEGY ELLIPTA  100-62.5-25 MCG/ACT AEPB Inhale 1 puff into the lungs daily. 01/30/24   Arrien, Elidia Sieving, MD   Current Facility-Administered Medications  Medication Dose Route Frequency Provider Last Rate Last Admin   acetaminophen  (TYLENOL ) tablet 650 mg  650 mg Oral Q6H PRN Mansy, Jan A, MD       Or   acetaminophen  (TYLENOL ) suppository 650 mg  650 mg Rectal Q6H PRN Mansy, Jan A, MD       amLODipine  (NORVASC ) tablet 10 mg  10 mg Oral Daily Mansy, Jan A, MD   10 mg at  03/10/24 0926   atorvastatin  (LIPITOR) tablet 10 mg  10 mg Oral Daily Mansy, Jan A, MD   10 mg at 03/10/24 9072   calcitRIOL  (ROCALTROL ) capsule 0.25 mcg  0.25 mcg Oral Daily Mansy, Jan A, MD       carvedilol  (COREG ) tablet 6.25 mg  6.25 mg Oral BID WC Mansy, Jan A, MD   6.25 mg at 03/10/24 0744   Chlorhexidine  Gluconate Cloth 2 % PADS 6 each  6 each Topical Q0600 Dolan Mateo Larger, MD       chlorpheniramine-HYDROcodone  (TUSSIONEX) 10-8 MG/5ML suspension 5 mL  5 mL Oral Q12H PRN Mansy, Jan A, MD       furosemide  (LASIX ) 200 mg in dextrose  5 % 100 mL (2 mg/mL) infusion  10 mg/hr Intravenous Continuous Mansy, Jan A, MD 5 mL/hr at 03/10/24 0639 10 mg/hr at 03/10/24 9360   gabapentin  (NEURONTIN ) capsule 100 mg  100 mg Oral BID Mansy, Jan A, MD   100 mg at 03/10/24 9073   guaiFENesin  (MUCINEX ) 12 hr tablet 600 mg  600 mg Oral BID Mansy, Jan A, MD   600 mg at 03/10/24 9072   heparin  injection 5,000 Units  5,000 Units Subcutaneous Q8H Mansy, Jan A, MD   5,000 Units at 03/10/24 9360   insulin  aspart (novoLOG ) injection 0-15 Units  0-15 Units Subcutaneous TID WC Mansy, Madison LABOR, MD       insulin  aspart (novoLOG ) injection 0-5 Units  0-5 Units Subcutaneous QHS Mansy, Jan A, MD        ipratropium-albuterol  (DUONEB) 0.5-2.5 (3) MG/3ML nebulizer solution 3 mL  3 mL Nebulization QID Mansy, Jan A, MD   3 mL at 03/10/24 0744   magnesium  hydroxide (MILK OF MAGNESIA) suspension 30 mL  30 mL Oral Daily PRN Mansy, Jan A, MD       morphine  (PF) 2 MG/ML injection 2 mg  2 mg Intravenous Q2H PRN Mansy, Jan A, MD   2 mg at 03/10/24 9256   nitroGLYCERIN  (NITROSTAT ) SL tablet 0.4 mg  0.4 mg Sublingual Q5 min PRN Mansy, Jan A, MD       ondansetron  (ZOFRAN ) tablet 4 mg  4 mg Oral Q6H PRN Mansy, Jan A, MD       Or   ondansetron  (ZOFRAN ) injection 4 mg  4 mg Intravenous Q6H PRN Mansy, Jan A, MD       pantoprazole  (PROTONIX ) EC tablet 40 mg  40 mg Oral BID Mansy, Jan A, MD   40 mg at 03/10/24 9072   sodium bicarbonate  tablet 1,300 mg  1,300 mg Oral BID Mansy, Jan A, MD   1,300 mg at 03/10/24 9073   traZODone  (DESYREL ) tablet 25 mg  25 mg Oral QHS PRN Mansy, Madison LABOR, MD       Current Outpatient Medications  Medication Sig Dispense Refill   acetaminophen  (TYLENOL ) 325 MG tablet Take 2 tablets (650 mg total) by mouth every 6 (six) hours as needed for mild pain (pain score 1-3). 20 tablet 0   albuterol  (VENTOLIN  HFA) 108 (90 Base) MCG/ACT inhaler Inhale 2 puffs into the lungs every 6 (six) hours as needed for wheezing or shortness of breath. 18 g 0   amLODipine  (NORVASC ) 10 MG tablet Take 1 tablet (10 mg total) by mouth daily. 30 tablet 0   atorvastatin  (LIPITOR) 10 MG tablet Take 1 tablet (10 mg total) by mouth daily. 30 tablet 0   calcitRIOL  (ROCALTROL ) 0.25 MCG capsule Take 1 capsule (0.25 mcg total) by mouth  daily. 30 capsule 0   carvedilol  (COREG ) 6.25 MG tablet Take 1 tablet (6.25 mg total) by mouth 2 (two) times daily with a meal. 60 tablet 0   gabapentin  (NEURONTIN ) 100 MG capsule Take 1 capsule (100 mg total) by mouth 2 (two) times daily for 60 doses. 60 capsule 0   pantoprazole  (PROTONIX ) 40 MG tablet Take 1 tablet (40 mg total) by mouth 2 (two) times daily. 60 tablet 0   sodium bicarbonate   650 MG tablet Take 2 tablets (1,300 mg total) by mouth 2 (two) times daily. 120 tablet 0   torsemide  (DEMADEX ) 100 MG tablet Take 1 tablet (100 mg total) by mouth daily. 30 tablet 0   TRELEGY ELLIPTA  100-62.5-25 MCG/ACT AEPB Inhale 1 puff into the lungs daily. 60 each 0   Labs: Basic Metabolic Panel: Recent Labs  Lab 03/10/24 0055 03/10/24 0714  NA 134*  --   K 4.4  --   CL 106  --   CO2 14*  --   GLUCOSE 101*  --   BUN 56*  --   CREATININE 5.74* 5.69*  CALCIUM  8.0*  --    Liver Function Tests: Recent Labs  Lab 03/10/24 0203  AST 35  ALT 28  ALKPHOS 129*  BILITOT 0.2  PROT 7.0  ALBUMIN  3.8   Recent Labs  Lab 03/10/24 0203  AMMONIA 23   CBC: Recent Labs  Lab 03/10/24 0055 03/10/24 0714  WBC 7.1 8.5  HGB 9.3* 8.9*  HCT 28.6* 27.3*  MCV 96.9 96.1  PLT 306 306   CBG: Recent Labs  Lab 03/10/24 0735  GLUCAP 93   Studies/Results: DG Chest 2 View Result Date: 03/10/2024 EXAM: 2 VIEW(S) XRAY OF THE CHEST 03/10/2024 01:08:00 AM COMPARISON: 02/03/2024 CLINICAL HISTORY: SOB, chest pain FINDINGS: LUNGS AND PLEURA: Moderate left pleural effusion. Small right pleural effusion. Bibasilar opacities. Pulmonary vascular congestion. No pneumothorax. HEART AND MEDIASTINUM: No acute abnormality of the cardiac and mediastinal silhouettes. BONES AND SOFT TISSUES: No acute osseous abnormality. IMPRESSION: 1. Moderate left and small right pleural effusions similar to prior. 2. Atelectasis or pneumonia in the lung bases. Electronically signed by: Norman Gatlin MD 03/10/2024 01:12 AM EST RP Workstation: [id number]   Dialysis Orders: no center  Assessment/Plan:  Pulm edema/effusions/volume overload: For HD today to assist, [facility] for Lasix  drip for now.  ESRD:  No dialysis in > 1 mo, CO2 low. For HD today - starting back with low BFR but will try to get decent amount of fluid today. Next HD Friday if still here.  Hypertension/volume: BP high with pulm edema/effusions, UF as tolerated.  Lasix  drip currently, change to PO tomorrow.  Anemia: Hgb 8.9 - repeat after getting some fluid off, likely dose with ESA while here  Metabolic bone disease: Ca ok, Phos pending.   Nutrition:  Alb surprisingly ok  Homelessness: Had been living with ex-husband but now has been kicked out. Renal navigator aware  Izetta Broadus RIGGERS 03/10/2024, 10:36 AM  [facility]     [1]  Allergies Allergen Reactions   Zestril  [Lisinopril ] Other (See Comments)    Hyperkalemia

## 2024-03-10 NOTE — Assessment & Plan Note (Deleted)
 Will continue statin therapy

## 2024-03-10 NOTE — Assessment & Plan Note (Deleted)
-   This is associated with end-stage renal disease on hemodialysis with noncompliance. - She has subsequent acute on chronic diastolic CHF.  Last EF was 60 to 65% on 10/23/2023 with grade 2 diastolic dysfunction, moderate left atrial dilatation.. -Patient will be admitted to a telemetry bed. - Will continue diuresis with IV Lasix  drip. - Nephrology consult will be obtained for hemodialysis. - Dr. Rayburn was notified about patient.

## 2024-03-10 NOTE — Assessment & Plan Note (Signed)
 Patient non compliant with outpatient follow up

## 2024-03-10 NOTE — ED Notes (Addendum)
 Pt disconnected herself from the monitor and went to the bathroom on her own. After pt left bathroom, the bathroom and hallway smelled like cigarette smoke. Charge RN was notified and security was contacted to search the patient. Pt admitted to smoking a cigarette in the bathroom and cigarettes and lighter were removed from the patient.

## 2024-03-11 LAB — BASIC METABOLIC PANEL WITH GFR
Anion gap: 15 (ref 5–15)
BUN: 34 mg/dL — ABNORMAL HIGH (ref 8–23)
CO2: 19 mmol/L — ABNORMAL LOW (ref 22–32)
Calcium: 7.9 mg/dL — ABNORMAL LOW (ref 8.9–10.3)
Chloride: 98 mmol/L (ref 98–111)
Creatinine, Ser: 4.13 mg/dL — ABNORMAL HIGH (ref 0.44–1.00)
GFR, Estimated: 11 mL/min — ABNORMAL LOW
Glucose, Bld: 88 mg/dL (ref 70–99)
Potassium: 4.5 mmol/L (ref 3.5–5.1)
Sodium: 133 mmol/L — ABNORMAL LOW (ref 135–145)

## 2024-03-11 LAB — CBC
HCT: 26.8 % — ABNORMAL LOW (ref 36.0–46.0)
Hemoglobin: 9.1 g/dL — ABNORMAL LOW (ref 12.0–15.0)
MCH: 31.6 pg (ref 26.0–34.0)
MCHC: 34 g/dL (ref 30.0–36.0)
MCV: 93.1 fL (ref 80.0–100.0)
Platelets: 314 K/uL (ref 150–400)
RBC: 2.88 MIL/uL — ABNORMAL LOW (ref 3.87–5.11)
RDW: 15.3 % (ref 11.5–15.5)
WBC: 5.8 K/uL (ref 4.0–10.5)
nRBC: 0 % (ref 0.0–0.2)

## 2024-03-11 LAB — GLUCOSE, CAPILLARY
Glucose-Capillary: 101 mg/dL — ABNORMAL HIGH (ref 70–99)
Glucose-Capillary: 119 mg/dL — ABNORMAL HIGH (ref 70–99)
Glucose-Capillary: 140 mg/dL — ABNORMAL HIGH (ref 70–99)
Glucose-Capillary: 138 mg/dL — ABNORMAL HIGH (ref 70–99)

## 2024-03-11 LAB — HEPATITIS B SURFACE ANTIBODY, QUANTITATIVE: Hep B S AB Quant (Post): <3.5 m[IU]/mL — ABNORMAL LOW

## 2024-03-11 MED ORDER — IPRATROPIUM-ALBUTEROL 0.5-2.5 (3) MG/3ML IN SOLN
3.0000 mL | Freq: Four times a day (QID) | RESPIRATORY_TRACT | Status: DC | PRN
  Administered 2024-03-14: 3 mL via RESPIRATORY_TRACT
  Filled 2024-03-11: qty 3

## 2024-03-11 MED ORDER — ALPRAZOLAM 0.5 MG PO TABS
0.5000 mg | ORAL_TABLET | Freq: Three times a day (TID) | ORAL | Status: DC | PRN
  Administered 2024-03-11 – 2024-03-15 (×4): 0.5 mg via ORAL
  Filled 2024-03-11 (×3): qty 1

## 2024-03-11 MED ORDER — FLUTICASONE FUROATE-VILANTEROL 200-25 MCG/ACT IN AEPB
1.0000 | INHALATION_SPRAY | Freq: Every day | RESPIRATORY_TRACT | Status: DC
  Administered 2024-03-13 – 2024-03-15 (×3): 1 via RESPIRATORY_TRACT
  Filled 2024-03-11: qty 28

## 2024-03-11 MED ORDER — TORSEMIDE 20 MG PO TABS
100.0000 mg | ORAL_TABLET | Freq: Every day | ORAL | Status: DC
  Administered 2024-03-11 – 2024-03-15 (×4): 100 mg via ORAL
  Filled 2024-03-11 (×3): qty 5

## 2024-03-11 NOTE — Progress Notes (Addendum)
 " Progress Note   PatientLESBIA [name] FMW:[id number] DOB: Oct 07, 1960 DOA: 03/10/2024     1 DOS: the patient was seen and examined on 03/11/2024   Brief hospital course: Mrs. [name] was admitted to the hospital with the working diagnosis of volume overload in the setting or ESRD on HD.   64 yo female with the past medical history of ESRD on HD, heart failure, COPD, asthma, polysubstance abuse, hypertension and anxiety who presented with dyspnea. She had worsening symptoms of cough, wheezing, and increased sputum production for the last 3 to 4 das prior to admission, despite using nebulizer treatments at home. Because of severe symptoms EMS was called. She was found in respiratory distress, and was transported to the ED. Patient has no outpatient HD unit and she comes to the hospital for hemodialysis, last session was 03/05/24.   Na 134, K 4.4 CL 106 bicarbonate 14 glucose 101 bun 56 cr 5.74  BNP >35,000 Wbc 7,1 hgb 9.3 plt 306  Sars covid 19 negative Influenza negative RSV negative   Chest radiograph with hyperinflation, right rotation, bilateral hilar vascular congestion, bilateral pleural effusions, more left than right.   EKG 81 bpm, normal axis, qtc 504, sinus rhythm with no significant ST segment or T wave changes.   12/24 hemodialysis with ultrafiltration with good toleration  12/25 improved volume status and respiratory function.   Assessment and Plan: * ESRD on dialysis (HCC) Hyponatremia  Improved volume status and respiratory function post HD on 12/24   Patient is homeless and she has no HD unit as outpatient due to non compliance.  She gets HD in the hospital.   Today BUN is down to 34, K is 4.5 and serum bicarbonate at 19  Na 133   Plan to resume po torsemide  and follow up with Nephrology for further renal replacement therapy.  Possible discharge home tomorrow.   Acute hypoxemic respiratory failure, due to volume overload induced pulmonary edema with bilateral  pleural effusions.  02 saturation today 100% on 2 L/min per [employer]   Metabolic bone disease continue with calcitriol  and oral bicarbonate   Chronic diastolic CHF (congestive heart failure) (HCC) Volume overload due to ESRD Acute on chronic diastolic heart failure  Improved volume status.  Continue blood pressure monitoring Continue amlodipine  and carvedilol .  Resume loop diuretic with torsemide  100 mg daily.   COPD with acute exacerbation (HCC) Continue bronchodilator therapy   Improved respiratory function, no further wheezing  Plan to hold on systemic steroids, continue with LABA and ICS.  As needed duoneb.   Airway clearing techniques with flutter valve and incentive spirometer Out of bed.  Pneumonia has been ruled out.   Essential hypertension Continue blood pressure monitoring  Continue blood pressure control with amlodipine  and carvedilol .  Type 2 diabetes mellitus with hyperlipidemia (HCC) Continue insulin  sliding scale for glucose cover and monitoring Continue statin therapy  Gabapentin  for neuropathy   GERD without esophagitis Continue pantoprazole    Substance induced mood disorder (HCC) Continue close monitoring Will need follow up as outpatient  Patient is homeless   Homelessness Patient non compliant with outpatient follow up   Protein-calorie malnutrition, severe Consult nutrition for supplementation      Subjective: patient with improved in dyspnea and wheezing, but continue to have cough, no chest pain. Positive ankle pain and anxiety.   Physical Exam: Vitals:   03/11/24 0010 03/11/24 0310 03/11/24 0742 03/11/24 0959  BP: (!) 155/67 (!) 167/74 (!) 177/79 (!) 169/90  Pulse: 76 77 76  Resp: 19 (!) 22 20   Temp: 98.4 F (36.9 C) 98.5 F (36.9 C) 97.7 F (36.5 C)   TempSrc: Oral Oral Oral   SpO2: 98% 98%    Weight:  53.3 kg    Height:       Neurology awake and alert ENT with mild pallor Cardiovascular with S1 and S2 present and regular with  no gallops, or rubs, no murmurs Respiratory with prolonged expiratory phase with scattered rhonchi with no wheezing, mild rales at bases Abdomen distended and tympanic with no rebound or tenderness No lower extremity edema   Data Reviewed:    Family Communication: no family at the bedside   Disposition: Status is: Inpatient Remains inpatient appropriate because: possible discharge tomorrow   Planned Discharge Destination: Home     Author: Elidia Toribio Furnace, MD 03/11/2024 11:29 AM  For on call review [email or web address].  "

## 2024-03-11 NOTE — Evaluation (Signed)
 Physical Therapy Brief Evaluation and Discharge Note Patient Details Name: [name] MRN: [id number] DOB: October 02, 1960 Today's Date: 03/11/2024   History of Present Illness  Pt is 63 yo presenting to [facility] on 12/24 due to dyspnea. Pt has had frequent hospitalizations this year.  PMHx: ESRD with noncompliance with HD, T2DM, COPD, PAD, HTN, GERD, GIB, AVM, CHF, anemia, polysubstance abuse, HLD, hep C, tobacco use, homelessness.  Clinical Impression  Pt currently is mod I for bed mobility, sit to stand with SPC and gait with SPC for 300 ft. No overt LOB during activity and HR/O2 sats remained WNL. Pt has little assistance out of the acute care hospital setting stating she currently lives with an ex but was told she had a month to leave. Pt will benefit from continued mobility while in the hospital by mobility team and nursing staff in order to prevent debilitation. Currently pt is presenting at baseline level of functioning and no skilled physical therapy services recommended. Pt will be discharged from skilled physical therapy services at this time; please re-consult if further needs arise.           PT Assessment Patient does not need any further PT services  Assistance Needed at Discharge  PRN    Equipment Recommendations None recommended by PT     Precautions/Restrictions Precautions Precautions: Fall Recall of Precautions/Restrictions: Intact Restrictions Weight Bearing Restrictions Per Provider Order: No        Mobility  Bed Mobility Rolling: Modified independent (Device/Increase time) Supine/Sidelying to sit: Modified independent (Device/Increased time) Sit to supine/sidelying: Modified independent (Device/Increased time)    Transfers Overall transfer level: Modified independent Equipment used: None, Straight cane    Ambulation/Gait Ambulation/Gait assistance: Modified independent (Device/Increase time) Gait Distance (Feet): 300 Feet Assistive device: Straight  cane Gait Pattern/deviations: Step-through pattern, Decreased stride length Gait Speed: Pace WFL General Gait Details: no overt LOB, O2 sats remained above 91% throughout gait on room air. No notable dyspnea.      Balance Overall balance assessment: Modified Independent      Pertinent Vitals/Pain   Pain Assessment Pain Assessment: No/denies pain     Home Living Family/patient expects to be discharged to:: Shelter/Homeless             Additional Comments: unclear what her DC plan/location. Pt reports right now she is staying with her ex but they told her she only has one month more to stay with them.    Prior Function Level of Independence: Independent with assistive device(s) Comments: uses SPC    UE/LE Assessment   UE ROM/Strength/Tone/Coordination: WFL    LE ROM/Strength/Tone/Coordination: [facility]      Communication   Communication Communication: No apparent difficulties     Cognition Overall Cognitive Status: Appears within functional limits for tasks assessed/performed              Assessment/Plan           No Skilled PT Patient at baseline level of functioning;Patient is modified independent with all activity/mobility    AMPAC 6 Clicks Help needed turning from your back to your side while in a flat bed without using bedrails?: None Help needed moving from lying on your back to sitting on the side of a flat bed without using bedrails?: None Help needed moving to and from a bed to a chair (including a wheelchair)?: None Help needed standing up from a chair using your arms (e.g., wheelchair or bedside chair)?: None Help needed to walk in hospital room?: None  Help needed climbing 3-5 steps with a railing? : A Little 6 Click Score: 23      End of Session Equipment Utilized During Treatment: Gait belt Activity Tolerance: Patient tolerated treatment well Patient left: in bed;with call bell/phone within reach Nurse Communication: Mobility status        Time: 8657-8642 PT Time Calculation (min) (ACUTE ONLY): 15 min  Charges:   PT Evaluation $PT Eval Low Complexity: 1 Low     [name], DPT, CLT  Acute Rehabilitation Services Office: [phone] (Secure chat preferred)   [name]  03/11/2024, 2:24 PM

## 2024-03-12 ENCOUNTER — Other Ambulatory Visit (HOSPITAL_COMMUNITY): Payer: Self-pay

## 2024-03-12 ENCOUNTER — Telehealth (HOSPITAL_COMMUNITY): Payer: Self-pay

## 2024-03-12 LAB — RENAL FUNCTION PANEL
Albumin: 3.8 g/dL (ref 3.5–5.0)
Anion gap: 16 — ABNORMAL HIGH (ref 5–15)
BUN: 52 mg/dL — ABNORMAL HIGH (ref 8–23)
CO2: 19 mmol/L — ABNORMAL LOW (ref 22–32)
Calcium: 7.8 mg/dL — ABNORMAL LOW (ref 8.9–10.3)
Chloride: 95 mmol/L — ABNORMAL LOW (ref 98–111)
Creatinine, Ser: 4.69 mg/dL — ABNORMAL HIGH (ref 0.44–1.00)
GFR, Estimated: 10 mL/min — ABNORMAL LOW
Glucose, Bld: 97 mg/dL (ref 70–99)
Phosphorus: 4.8 mg/dL — ABNORMAL HIGH (ref 2.5–4.6)
Potassium: 4.7 mmol/L (ref 3.5–5.1)
Sodium: 130 mmol/L — ABNORMAL LOW (ref 135–145)

## 2024-03-12 LAB — GLUCOSE, CAPILLARY
Glucose-Capillary: 94 mg/dL (ref 70–99)
Glucose-Capillary: 98 mg/dL (ref 70–99)
Glucose-Capillary: 100 mg/dL — ABNORMAL HIGH (ref 70–99)
Glucose-Capillary: 113 mg/dL — ABNORMAL HIGH (ref 70–99)

## 2024-03-12 MED ORDER — DARBEPOETIN ALFA 60 MCG/0.3ML IJ SOSY
60.0000 ug | PREFILLED_SYRINGE | INTRAMUSCULAR | Status: DC
  Filled 2024-03-12 (×2): qty 0.3

## 2024-03-12 MED ORDER — BISACODYL 5 MG PO TBEC
5.0000 mg | DELAYED_RELEASE_TABLET | Freq: Once | ORAL | Status: AC
  Administered 2024-03-12: 5 mg via ORAL
  Filled 2024-03-12: qty 1

## 2024-03-12 MED ORDER — NEPRO/CARBSTEADY PO LIQD
237.0000 mL | Freq: Two times a day (BID) | ORAL | Status: DC
  Administered 2024-03-12 – 2024-03-14 (×5): 237 mL via ORAL

## 2024-03-12 MED ORDER — OXYCODONE HCL 5 MG PO TABS
5.0000 mg | ORAL_TABLET | Freq: Once | ORAL | Status: AC
  Administered 2024-03-12: 5 mg via ORAL
  Filled 2024-03-12: qty 1

## 2024-03-12 NOTE — Progress Notes (Signed)
 OT Cancellation Note  Patient Details Name: STARLETTA HOUCHIN MRN: [id number] DOB: May 10, 1960   Cancelled Treatment:    Reason Eval/Treat Not Completed: OT screened, no needs identified, will sign off. Per PT, pt ind, no OT needs. OT is signing off on this pt.   Jaydeen Darley C, OT  Acute Rehabilitation Services Office [phone] Secure chat preferred   Adrianne GORMAN Savers 03/12/2024, 10:01 AM

## 2024-03-12 NOTE — Progress Notes (Signed)
 " Progress Note   PatientBUFFI [name] FMW:[id number] DOB: 1960-10-25 DOA: 03/10/2024     2 DOS: the patient was seen and examined on 03/12/2024   Brief hospital course: [name] was admitted to the hospital with the working diagnosis of volume overload in the setting or ESRD on HD.   63 yo female with the past medical history of ESRD on HD, heart failure, COPD, asthma, polysubstance abuse, hypertension and anxiety who presented with dyspnea. She had worsening symptoms of cough, wheezing, and increased sputum production for the last 3 to 4 das prior to admission, despite using nebulizer treatments at home. Because of severe symptoms EMS was called. She was found in respiratory distress, and was transported to the ED. Patient has no outpatient HD unit and she comes to the hospital for hemodialysis, last session was 03/05/24.   Na 134, K 4.4 CL 106 bicarbonate 14 glucose 101 bun 56 cr 5.74  BNP >35,000 Wbc 7,1 hgb 9.3 plt 306  Sars covid 19 negative Influenza negative RSV negative   Chest radiograph with hyperinflation, right rotation, bilateral hilar vascular congestion, bilateral pleural effusions, more left than right.   EKG 81 bpm, normal axis, qtc 504, sinus rhythm with no significant ST segment or T wave changes.   12/24 hemodialysis with ultrafiltration with good toleration  12/25 improved volume status and respiratory function.  12/26 HD today with good toleration, plan for discharge home tomorrow.   Assessment and Plan: * ESRD on dialysis (HCC) Hyponatremia  Improved volume status and respiratory function post HD on 12/24   Patient is homeless and she has no HD unit as outpatient due to non compliance.  She gets HD in the hospital.   Pre HD today BUN 52 with K at 4.7 and serum bicarbonate at 19  Na 130   Continue with po torsemide  for diuresis  She had HD today and will plan to discharge home tomorrow.   Acute hypoxemic respiratory failure, due to volume overload  induced pulmonary edema with bilateral pleural effusions.   Oxygenation has improved, today 02 saturation is 100% on room air post HD   Metabolic bone disease continue with calcitriol  and oral bicarbonate   Chronic diastolic CHF (congestive heart failure) (HCC) Volume overload due to ESRD Acute on chronic diastolic heart failure  Improved volume status.  Continue blood pressure monitoring Continue amlodipine  and carvedilol .  Loop diuretic with torsemide  100 mg daily.   COPD with acute exacerbation (HCC) Continue bronchodilator therapy   Improved respiratory function, no further wheezing  Continue with LABA and ICS.  As needed duoneb.   Airway clearing techniques with flutter valve and incentive spirometer Out of bed.  Pneumonia has been ruled out.   Essential hypertension Continue blood pressure monitoring  Continue blood pressure control with amlodipine  and carvedilol .  Type 2 diabetes mellitus with hyperlipidemia (HCC) Continue insulin  sliding scale for glucose cover and monitoring Continue statin therapy  Gabapentin  for neuropathy   GERD without esophagitis Continue pantoprazole    Substance induced mood disorder (HCC) Continue close monitoring Will need follow up as outpatient  Patient is homeless   Homelessness Patient non compliant with outpatient follow up   Protein-calorie malnutrition, severe Consult nutrition for supplementation         Subjective: Patient having headache post HD today, her dyspnea has improved, no nausea or vomiting. Intermittent cough. Positive constipation and abdominal distention   Physical Exam: Vitals:   03/12/24 1207 03/12/24 1322 03/12/24 1405 03/12/24 1431  BP: ROLLEN)  158/80 (!) 174/94 (!) 189/90   Pulse: 80 78 86 88  Resp: 13 17 17    Temp:  (!) 97.5 F (36.4 C)    TempSrc:      SpO2: 100% 100% 99% 100%  Weight:  67.4 kg    Height:       Neurology awake and alert ENT with mild pallor with no icterus Cardiovascular  with S1 and S2 present and regular with no gallops, rubs or murmurs Respiratory with prolonged expiratory phase and scattered rhonchi, with no wheezing Abdomen with distention, tympanic to percussion No lower extremity edema  Data Reviewed:    Family Communication: no family at the bedside   Disposition: Status is: Inpatient Remains inpatient appropriate because: plan for discharge home tomorrow   Planned Discharge Destination: Home      Author: Elidia Toribio Furnace, MD 03/12/2024 3:40 PM  For on call review [email or web address].  "

## 2024-03-12 NOTE — Progress Notes (Signed)
 Pt. Came in awake and alert, on 02 at 3LPM via nasal cannula. Consent verified and on file. Tx started with no complaints  UF removal: 3500 Tx duration: 3.5 hours  Access used: Right AVF Access issue: None  Needed to restring the machine due to air bubbles. Pt. Requested to decrease goal to 3.5L due to pt. Cannot tolerate 4L as per her statement Tx completed and tolerated. Pressure dressing applied Endorsed to floor nurse. Transported to floor.  Allyse Fregeau Rubi Merdith Adan, RN [facility]

## 2024-03-12 NOTE — Telephone Encounter (Signed)
 Pharmacy Patient Advocate Encounter  Insurance verification completed.    The patient is insured through [employer]. Patient has Medicare and is not eligible for a copay card, but may be able to apply for patient assistance or Medicare RX Payment Plan (Patient Must reach out to their plan, if eligible for payment plan), if available.    Ran test claim for Breo Ellipta  100-24mcg and the current 30 day co-pay is $4.80.   This test claim was processed through [facility]- copay amounts may vary at other pharmacies due to pharmacy/plan contracts, or as the patient moves through the different stages of their insurance plan.

## 2024-03-12 NOTE — Progress Notes (Signed)
" °   03/12/24 1322  Vitals  Temp (!) 97.5 F (36.4 C)  Pulse Rate 78  Resp 17  BP (!) 174/94  SpO2 100 %  O2 Device Nasal Cannula  Weight 67.4 kg  Type of Weight Actual  Oxygen  Therapy  O2 Flow Rate (L/min) 6 L/min  Patient Activity (if Appropriate) In bed  Pulse Oximetry Type Continuous  Oximetry Probe Site Changed No  Post Treatment  Dialyzer Clearance Lightly streaked  Liters Processed 63.8  Fluid Removed (mL) 3.8 mL  Tolerated HD Treatment Yes  Post-Hemodialysis Comments Pt. tolerated tx well  AVG/AVF Arterial Site Held (minutes) 5 minutes  AVG/AVF Venous Site Held (minutes) 5 minutes    "

## 2024-03-12 NOTE — Progress Notes (Signed)
 Initial Nutrition Assessment  DOCUMENTATION CODES:  Severe malnutrition in context of social or environmental circumstances  INTERVENTION:  Nepro BID. Each supplement provides 425 Kcals and 19 grams protein. Liberalize diet to regular diet from carb-modified.  NUTRITION DIAGNOSIS:  Severe Malnutrition related to social / environmental circumstances as evidenced by severe muscle depletion, severe fat depletion   GOAL:  Patient will meet greater than or equal to 90% of their needs   MONITOR:  PO intake, Supplement acceptance, Labs, Weight trends, I & O's  REASON FOR ASSESSMENT:  Consult Assessment of nutrition requirement/status  ASSESSMENT:  Patient presented with dyspnea and was found to be volume overloaded as she has no outpatient HD unit due to noncompliance and relies on HD in hospital. PMH significant for homelessness, ESRD on HD, CHF, COPD, asthma, ETOH and polysubstance abuse, GIB s/p EGDs, HTN, anxiety.  Visited the patient who states that due to her homelessness, she is unable to comply with a renal diet. She eats when she is able to obtain food. She has a good appetite and has been eating very well here. She is amenable to Nepro. She thinks her UBW is 115-120 lbs and her weight has been stable. She c/o a very distended abdomen which she has been told is fluid although she feels it is too hard to be fluid. This is only the second time in two months she is getting dialysis.  Scheduled Meds:  amLODipine   10 mg Oral Daily   atorvastatin   10 mg Oral Daily   calcitRIOL   0.25 mcg Oral Daily   carvedilol   6.25 mg Oral BID WC   Chlorhexidine  Gluconate Cloth  6 each Topical Q0600   darbepoetin (ARANESP ) injection - DIALYSIS  60 mcg Subcutaneous Q Fri-1800   fluticasone  furoate-vilanterol  1 puff Inhalation Daily   gabapentin   100 mg Oral BID   guaiFENesin   600 mg Oral BID   heparin   5,000 Units Subcutaneous Q8H   insulin  aspart  0-15 Units Subcutaneous TID WC   insulin  aspart   0-5 Units Subcutaneous QHS   oxyCODONE   5 mg Oral Once   pantoprazole   40 mg Oral BID   sodium bicarbonate   1,300 mg Oral BID   torsemide   100 mg Oral Daily   Continuous Infusions: PRN Meds:.acetaminophen  **OR** acetaminophen , ALPRAZolam , chlorpheniramine-HYDROcodone , ipratropium-albuterol , magnesium  hydroxide, morphine  injection, nitroGLYCERIN , ondansetron  **OR** ondansetron  (ZOFRAN ) IV, traZODone   Diet Order             Diet Carb Modified Room service appropriate? Yes  Diet effective now                  Meal Intake: 75-100%  Labs:     Latest Ref Rng & Units 03/12/2024    5:39 AM 03/11/2024    4:37 AM 03/10/2024    7:14 AM  CMP  Glucose 70 - 99 mg/dL 97  88    BUN 8 - 23 mg/dL 52  34    Creatinine 9.55 - 1.00 mg/dL 5.30  5.86  4.30   Sodium 135 - 145 mmol/L 130  133    Potassium 3.5 - 5.1 mmol/L 4.7  4.5    Chloride 98 - 111 mmol/L 95  98    CO2 22 - 32 mmol/L 19  19    Calcium  8.9 - 10.3 mg/dL 7.8  7.9      I/O: -3.7 L since admit  NUTRITION - FOCUSED PHYSICAL EXAM: Flowsheet Row Most Recent Value  Orbital Region Severe depletion  Upper  Arm Region Severe depletion  Thoracic and Lumbar Region Moderate depletion  Buccal Region No depletion  Temple Region Severe depletion  Clavicle Bone Region Severe depletion  Clavicle and Acromion Bone Region Severe depletion  Scapular Bone Region Severe depletion  Dorsal Hand Severe depletion  Patellar Region Severe depletion  Anterior Thigh Region Severe depletion  Posterior Calf Region Severe depletion  Edema (RD Assessment) Mild  Hair Reviewed  Eyes Reviewed  Mouth Other (Comment)  [edentulous]  Skin Reviewed  Nails Reviewed    EDUCATION NEEDS:  Education needs have been addressed  Skin:  Skin Assessment: Reviewed RN Assessment  Last BM:  12/25  Height:  Ht Readings from Last 1 Encounters:  03/10/24 5' 7 (1.702 m)   Weight:  Wt Readings from Last 10 Encounters:  03/12/24 67.4 kg  02/03/24 52.2 kg   01/29/24 54.4 kg  01/17/24 55.7 kg  11/25/23 52.2 kg  11/02/23 53.6 kg  10/05/23 57.6 kg  09/25/23 57.6 kg  09/19/23 72.2 kg  06/30/23 56.1 kg   Weight Change: relatively stable weight noted  Usual Body Weight: 115-120 lbs per patient  Edema: generalized non-pitting  Ideal Body Weight:  61.4 kg   BMI:  Body mass index is 23.27 kg/m.  Estimated Daily Nutritional Needs:  Kcal:  1600-1800 Protein:  90-110 g Fluid:  >/=1600 mL    Leverne Ruth, MS, RDN, LDN [place]. [facility] See AMION for contact information Secure chat preferred

## 2024-03-12 NOTE — Progress Notes (Signed)
" °  [facility] Progress Note   Subjective: Seen in HD unit. On dialysis. UFG 3.5. C/o abdominal pain, constipation.   Objective Vitals:   03/12/24 1030 03/12/24 1100 03/12/24 1130 03/12/24 1207  BP: (!) 173/84 (!) 174/80 (!) 180/84 (!) 158/80  Pulse: 80 72 72 80  Resp: 17 15 15 13   Temp:      TempSrc:      SpO2: 100% 100% 100% 100%  Weight:      Height:        Additional Objective Labs: Basic Metabolic Panel: Recent Labs  Lab 03/10/24 0055 03/10/24 0714 03/11/24 0437 03/12/24 0539  NA 134*  --  133* 130*  K 4.4  --  4.5 4.7  CL 106  --  98 95*  CO2 14*  --  19* 19*  GLUCOSE 101*  --  88 97  BUN 56*  --  34* 52*  CREATININE 5.74* 5.69* 4.13* 4.69*  CALCIUM  8.0*  --  7.9* 7.8*  PHOS  --   --   --  4.8*   CBC: Recent Labs  Lab 03/10/24 0055 03/10/24 0714 03/11/24 0437  WBC 7.1 8.5 5.8  HGB 9.3* 8.9* 9.1*  HCT 28.6* 27.3* 26.8*  MCV 96.9 96.1 93.1  PLT 306 306 314   Blood Culture    Component Value Date/Time   SDES PERITONEAL 01/02/2024 1152   SDES PERITONEAL 01/02/2024 1152   SPECREQUEST NONE 01/02/2024 1152   SPECREQUEST NONE 01/02/2024 1152   CULT  01/02/2024 1152    NO GROWTH 5 DAYS Performed at [facility], [address] [place], [place] [address]    REPTSTATUS 01/07/2024 FINAL 01/02/2024 1152   REPTSTATUS 01/02/2024 FINAL 01/02/2024 1152     Physical Exam General: Chronically ill appearing, nad Heart: RRR Lungs: Clear, normal wob Abdomen: soft Extremities: No LE edema  Dialysis Access: R AVF   Medications:   amLODipine   10 mg Oral Daily   atorvastatin   10 mg Oral Daily   calcitRIOL   0.25 mcg Oral Daily   carvedilol   6.25 mg Oral BID WC   Chlorhexidine  Gluconate Cloth  6 each Topical Q0600   fluticasone  furoate-vilanterol  1 puff Inhalation Daily   gabapentin   100 mg Oral BID   guaiFENesin   600 mg Oral BID   heparin   5,000 Units Subcutaneous Q8H   insulin  aspart  0-15 Units Subcutaneous TID WC   insulin  aspart   0-5 Units Subcutaneous QHS   pantoprazole   40 mg Oral BID   sodium bicarbonate   1,300 mg Oral BID   torsemide   100 mg Oral Daily    Dialysis Orders: no center   Assessment/Plan:  Pulm edema/effusions/volume overload: For HD today to assist.   ESRD:  No dialysis in > 1 mo.  Received HD here 12/24. HD again today.  Hypertension/volume: BP high with pulm edema/effusions. UF as tolerated. On daily torsemide .   Anemia: Hgb 9.1. Will order ESA   Metabolic bone disease: Ca/Phos acceptable. Trend labs.   Nutrition:  Alb surprisingly ok  Homelessness: Had been living with ex-husband but now has been kicked out. Renal navigator aware    Maisie Ronnald Acosta PA-C [facility] 03/12/2024,12:33 PM        "

## 2024-03-13 ENCOUNTER — Inpatient Hospital Stay (HOSPITAL_COMMUNITY)

## 2024-03-13 ENCOUNTER — Other Ambulatory Visit (HOSPITAL_COMMUNITY): Payer: Self-pay

## 2024-03-13 LAB — GLUCOSE, CAPILLARY
Glucose-Capillary: 123 mg/dL — ABNORMAL HIGH (ref 70–99)
Glucose-Capillary: 90 mg/dL (ref 70–99)
Glucose-Capillary: 97 mg/dL (ref 70–99)
Glucose-Capillary: 106 mg/dL — ABNORMAL HIGH (ref 70–99)

## 2024-03-13 LAB — TROPONIN T, HIGH SENSITIVITY
Troponin T High Sensitivity: 51 ng/L — ABNORMAL HIGH (ref 0–19)
Troponin T High Sensitivity: 49 ng/L — ABNORMAL HIGH (ref 0–19)

## 2024-03-13 LAB — PRO BRAIN NATRIURETIC PEPTIDE: Pro Brain Natriuretic Peptide: >35000 pg/mL — ABNORMAL HIGH

## 2024-03-13 MED ORDER — BUSPIRONE HCL 5 MG PO TABS
5.0000 mg | ORAL_TABLET | Freq: Three times a day (TID) | ORAL | Status: DC
  Administered 2024-03-13 – 2024-03-15 (×7): 5 mg via ORAL
  Filled 2024-03-13 (×6): qty 1

## 2024-03-13 MED ORDER — AMLODIPINE BESYLATE 10 MG PO TABS
10.0000 mg | ORAL_TABLET | Freq: Every day | ORAL | 0 refills | Status: DC
  Filled 2024-03-13: qty 30, 30d supply, fill #0

## 2024-03-13 MED ORDER — ALBUTEROL SULFATE HFA 108 (90 BASE) MCG/ACT IN AERS
2.0000 | INHALATION_SPRAY | Freq: Four times a day (QID) | RESPIRATORY_TRACT | 0 refills | Status: AC | PRN
  Filled 2024-03-13: qty 18, 25d supply, fill #0

## 2024-03-13 MED ORDER — CARVEDILOL 6.25 MG PO TABS
6.2500 mg | ORAL_TABLET | Freq: Two times a day (BID) | ORAL | 0 refills | Status: DC
  Filled 2024-03-13: qty 60, 30d supply, fill #0

## 2024-03-13 MED ORDER — ATORVASTATIN CALCIUM 10 MG PO TABS
10.0000 mg | ORAL_TABLET | Freq: Every day | ORAL | 0 refills | Status: AC
  Filled 2024-03-13: qty 30, 30d supply, fill #0

## 2024-03-13 MED ORDER — TRELEGY ELLIPTA 100-62.5-25 MCG/ACT IN AEPB
1.0000 | INHALATION_SPRAY | Freq: Every day | RESPIRATORY_TRACT | 0 refills | Status: AC
  Filled 2024-03-13: qty 60, 30d supply, fill #0

## 2024-03-13 MED ORDER — CALCITRIOL 0.25 MCG PO CAPS
0.2500 ug | ORAL_CAPSULE | Freq: Every day | ORAL | 0 refills | Status: AC
  Filled 2024-03-13: qty 30, 30d supply, fill #0

## 2024-03-13 MED ORDER — GABAPENTIN 100 MG PO CAPS
100.0000 mg | ORAL_CAPSULE | Freq: Three times a day (TID) | ORAL | Status: DC
  Administered 2024-03-13 – 2024-03-15 (×6): 100 mg via ORAL
  Filled 2024-03-13 (×5): qty 1

## 2024-03-13 MED ORDER — PANTOPRAZOLE SODIUM 40 MG PO TBEC
40.0000 mg | DELAYED_RELEASE_TABLET | Freq: Two times a day (BID) | ORAL | 0 refills | Status: AC
  Filled 2024-03-13: qty 60, 30d supply, fill #0

## 2024-03-13 MED ORDER — OXYCODONE HCL 5 MG PO TABS
5.0000 mg | ORAL_TABLET | Freq: Once | ORAL | Status: AC
  Administered 2024-03-13: 5 mg via ORAL
  Filled 2024-03-13: qty 1

## 2024-03-13 MED ORDER — BUSPIRONE HCL 5 MG PO TABS
5.0000 mg | ORAL_TABLET | Freq: Three times a day (TID) | ORAL | 0 refills | Status: DC
  Filled 2024-03-13: qty 90, 30d supply, fill #0

## 2024-03-13 MED ORDER — TORSEMIDE 100 MG PO TABS
100.0000 mg | ORAL_TABLET | Freq: Every day | ORAL | 0 refills | Status: AC
  Filled 2024-03-13: qty 30, 30d supply, fill #0

## 2024-03-13 MED ORDER — OXYCODONE HCL 5 MG PO TABS
5.0000 mg | ORAL_TABLET | Freq: Four times a day (QID) | ORAL | Status: DC | PRN
  Administered 2024-03-13 – 2024-03-15 (×5): 5 mg via ORAL
  Filled 2024-03-13 (×4): qty 1

## 2024-03-13 MED ORDER — SODIUM BICARBONATE 650 MG PO TABS
1300.0000 mg | ORAL_TABLET | Freq: Two times a day (BID) | ORAL | 0 refills | Status: AC
  Filled 2024-03-13: qty 120, 30d supply, fill #0

## 2024-03-13 MED ORDER — GABAPENTIN 100 MG PO CAPS
100.0000 mg | ORAL_CAPSULE | Freq: Three times a day (TID) | ORAL | 0 refills | Status: AC
  Filled 2024-03-13: qty 90, 30d supply, fill #0

## 2024-03-13 MED ORDER — MORPHINE SULFATE (PF) 2 MG/ML IV SOLN
2.0000 mg | INTRAVENOUS | Status: DC | PRN
  Administered 2024-03-14: 2 mg via INTRAVENOUS
  Filled 2024-03-13: qty 1

## 2024-03-13 NOTE — Progress Notes (Signed)
"  ° °      CROSS COVER NOTE  NAME: DALTON MILLE MRN: [id number] DOB : 16-Feb-1961    Concern as stated by nurse / staff   Pt is complaining of chest pain again 10/10. She is refusing to take oxycodone  and wanted a shot. We're doing EKG. BP 165/72 HR 79 RR 18 SPO2 91 RA, temp 98.3      Pertinent findings on chart review: Admitted for volume overload improved with extra dialysis sessions as well as COPD exacerbation planning for discharge on 12/28  Patient Assessment Patient evaluated at bedside.  Nurse present.  Complains of central tightness radiating up her neck to both jaws, forehead and top of head.  Has been intermittent for over a week    03/14/2024    4:29 AM 03/14/2024    1:59 AM 03/14/2024   12:23 AM  Vitals with BMI  Weight 148 lbs 2 oz    BMI 23.19    Systolic 151 162 865  Diastolic 74 75 60  Pulse 79 80 77   Physical Exam Vitals and nursing note reviewed.  Constitutional:      General: She is not in acute distress.    Comments: Appears anxious but not in acute distress  HENT:     Head: Normocephalic and atraumatic.  Cardiovascular:     Rate and Rhythm: Normal rate and regular rhythm.     Heart sounds: Normal heart sounds.  Pulmonary:     Effort: Pulmonary effort is normal.     Breath sounds: Normal breath sounds.  Abdominal:     Palpations: Abdomen is soft.     Tenderness: There is no abdominal tenderness.  Neurological:     General: No focal deficit present.            Assessment and  Interventions   Assessment:  Chest discomfort, etiology unclear  Plan: Troponin 51->49, BNP -->35000 EKG nonacute CXR->Diffuse bilateral interstitial opacities with bibasilar airspace opacities and small to moderate left and small right pleural effusions, with rightward tracheal deviation. NTG SL prn, Duoneb prn Already on Protonix      CRITICAL CARE Performed by: Delayne LULLA Solian   Total critical care time: 60 minutes  Critical care time was exclusive of  separately billable procedures and treating other patients.  Critical care was necessary to treat or prevent imminent or life-threatening deterioration.  Critical care was time spent personally by me on the following activities: development of treatment plan with patient and/or surrogate as well as nursing, discussions with consultants, evaluation of patient's response to treatment, examination of patient, obtaining history from patient or surrogate, ordering and performing treatments and interventions, ordering and review of laboratory studies, ordering and review of radiographic studies, pulse oximetry and re-evaluation of patient's condition.  "

## 2024-03-13 NOTE — Progress Notes (Signed)
 " [facility] Progress Note   Subjective:   Completed dialysis yesterday - net UF 3.8L.  Reporting severe R ankle pain this am. Site of previous injury. Primary MD aware   Objective Vitals:   03/12/24 2018 03/13/24 0100 03/13/24 0643 03/13/24 0822  BP: (!) 168/76 (!) 152/71 (!) 157/70 (!) 159/73  Pulse: 79 82 85 79  Resp: 17 18 16 17   Temp: 98.2 F (36.8 C) 98 F (36.7 C) 98.4 F (36.9 C) 98.2 F (36.8 C)  TempSrc: Oral Oral Oral Oral  SpO2: 98% 100% 92% 90%  Weight:      Height:        Additional Objective Labs: Basic Metabolic Panel: Recent Labs  Lab 03/10/24 0055 03/10/24 0714 03/11/24 0437 03/12/24 0539  NA 134*  --  133* 130*  K 4.4  --  4.5 4.7  CL 106  --  98 95*  CO2 14*  --  19* 19*  GLUCOSE 101*  --  88 97  BUN 56*  --  34* 52*  CREATININE 5.74* 5.69* 4.13* 4.69*  CALCIUM  8.0*  --  7.9* 7.8*  PHOS  --   --   --  4.8*   CBC: Recent Labs  Lab 03/10/24 0055 03/10/24 0714 03/11/24 0437  WBC 7.1 8.5 5.8  HGB 9.3* 8.9* 9.1*  HCT 28.6* 27.3* 26.8*  MCV 96.9 96.1 93.1  PLT 306 306 314   Blood Culture    Component Value Date/Time   SDES PERITONEAL 01/02/2024 1152   SDES PERITONEAL 01/02/2024 1152   SPECREQUEST NONE 01/02/2024 1152   SPECREQUEST NONE 01/02/2024 1152   CULT  01/02/2024 1152    NO GROWTH 5 DAYS Performed at [facility], [address] [place], [place] [address]    REPTSTATUS 01/07/2024 FINAL 01/02/2024 1152   REPTSTATUS 01/02/2024 FINAL 01/02/2024 1152     Physical Exam General: Chronically ill appearing, nad Heart: RRR Lungs: Clear, normal wob Abdomen: soft, distended  Extremities: No LE edema  Dialysis Access: R AVF   Medications:   amLODipine   10 mg Oral Daily   atorvastatin   10 mg Oral Daily   calcitRIOL   0.25 mcg Oral Daily   carvedilol   6.25 mg Oral BID WC   Chlorhexidine  Gluconate Cloth  6 each Topical Q0600   darbepoetin (ARANESP ) injection - DIALYSIS  60 mcg Subcutaneous Q Fri-1800    feeding supplement (NEPRO CARB STEADY)  237 mL Oral BID BM   fluticasone  furoate-vilanterol  1 puff Inhalation Daily   gabapentin   100 mg Oral TID   guaiFENesin   600 mg Oral BID   heparin   5,000 Units Subcutaneous Q8H   insulin  aspart  0-15 Units Subcutaneous TID WC   insulin  aspart  0-5 Units Subcutaneous QHS   pantoprazole   40 mg Oral BID   sodium bicarbonate   1,300 mg Oral BID   torsemide   100 mg Oral Daily    Dialysis Orders: no center   Assessment/Plan: Pulm edema/effusions/volume overload: Volume lowering with HD.  ESRD:  No dialysis in > 1 mo.  Received HD here 12/24, 12/26. Declines HD today.  Hypertension/volume: BP high with pulm edema/effusions. UF as tolerated. On daily torsemide .  Anemia: Hgb 9.1. Will order ESA  Metabolic bone disease: Ca/Phos acceptable. Trend labs.  Nutrition:  Alb surprisingly ok R ankle pain: per primary  Homelessness: Had been living with ex-husband but now has been kicked out. Renal navigator aware    Maisie Ronnald Acosta PA-C [facility] 03/13/2024,10:04  AM   "

## 2024-03-13 NOTE — Plan of Care (Signed)
" °  Problem: Metabolic: Goal: Ability to maintain appropriate glucose levels will improve Outcome: Progressing   Problem: Activity: Goal: Capacity to carry out activities will improve Outcome: Progressing   Problem: Cardiac: Goal: Ability to achieve and maintain adequate cardiopulmonary perfusion will improve Outcome: Progressing   Problem: Clinical Measurements: Goal: Respiratory complications will improve Outcome: Progressing Goal: Cardiovascular complication will be avoided Outcome: Progressing   Problem: Activity: Goal: Risk for activity intolerance will decrease Outcome: Progressing   Problem: Nutrition: Goal: Adequate nutrition will be maintained Outcome: Progressing   Problem: Safety: Goal: Ability to remain free from injury will improve Outcome: Progressing   "

## 2024-03-13 NOTE — Discharge Summary (Signed)
 " Physician Discharge Summary   Patient: [name] MRN: [id number] DOB: Jul 25, 1960  Admit date:     03/10/2024  Discharge date: 03/15/2024  Discharge Physician: Elidia Sieving Jayana Kotula   PCP: Pcp, No   Recommendations at discharge:    Patient will continue coming to the hospital for HD. She has been advised to be adherent to her medications.   Addendum patient was discharged on 12/29 in order to get further fluid removal on hemodialysis.   Discharge Diagnoses: Principal Problem:   ESRD on dialysis [facility]) Active Problems:   COPD with acute exacerbation (HCC)   Chronic diastolic CHF (congestive heart failure) (HCC)   Essential hypertension   Type 2 diabetes mellitus with hyperlipidemia (HCC)   GERD without esophagitis   Substance induced mood disorder (HCC)   Homelessness   Protein-calorie malnutrition, severe  Resolved Problems:   * No resolved hospital problems. [name] Course: [name] was admitted to the hospital with the working diagnosis of volume overload in the setting or ESRD on HD.   63 yo female with the past medical history of ESRD on HD, heart failure, COPD, asthma, polysubstance abuse, hypertension and anxiety who presented with dyspnea. She had worsening symptoms of cough, wheezing, and increased sputum production for the last 3 to 4 das prior to admission, despite using nebulizer treatments at home. Because of severe symptoms EMS was called. She was found in respiratory distress, and was transported to the ED. Patient has no outpatient HD unit and she comes to the hospital for hemodialysis, last session was 03/05/24.   Na 134, K 4.4 CL 106 bicarbonate 14 glucose 101 bun 56 cr 5.74  BNP >35,000 Wbc 7,1 hgb 9.3 plt 306  Sars covid 19 negative Influenza negative RSV negative   Chest radiograph with hyperinflation, right rotation, bilateral hilar vascular congestion, bilateral pleural effusions, more left than right.   EKG 81 bpm, normal axis, qtc 504,  sinus rhythm with no significant ST segment or T wave changes.   12/24 hemodialysis with ultrafiltration with good toleration  12/25 improved volume status and respiratory function.  12/26 HD today with good toleration, plan for discharge home tomorrow.  12/27 continue HD as usual in the hospital  Assessment and Plan: * ESRD on dialysis (HCC) Hyponatremia  Improved volume status and respiratory function after inpatient renal replacement therapy with ultrafiltration.   Patient is homeless and she has no HD unit as outpatient due to non compliance.  She gets HD in the hospital.   Continue with po torsemide  for diuresis    Acute hypoxemic respiratory failure, due to volume overload induced pulmonary edema with bilateral pleural effusions.   Improved oxygenation post ultrafiltration   Metabolic bone disease continue with calcitriol  and oral bicarbonate   Today HD with ultrafiltration 4000 ml   Chronic diastolic CHF (congestive heart failure) (HCC) Volume overload due to ESRD Acute on chronic diastolic heart failure  Continue amlodipine  and carvedilol .  Loop diuretic with torsemide  100 mg daily.  HD today with 4000 ml fluid removal.   COPD with acute exacerbation (HCC) Continue bronchodilator therapy   No further wheezing  Patient was placed on bronchodilator therapy and inhaled corticosteroids.  She had a short course of systemic steroids.   She had airway clearing techniques with flutter valve and incentive spirometer Pneumonia has been ruled out.   Essential hypertension Continue blood pressure control with amlodipine  and carvedilol .  Type 2 diabetes mellitus with hyperlipidemia (HCC) Patient was placed on insulin  sliding  scale for glucose cover and monitoring   Continue statin therapy  Gabapentin  for neuropathy   GERD without esophagitis Continue pantoprazole    Substance induced mood disorder (HCC) Will need follow up as outpatient  Patient is homeless    Homelessness Patient non compliant with outpatient follow up   Protein-calorie malnutrition, severe Patient was placed on nutritional supplementation   Consultants: Nephrology  Procedures performed: none   Disposition: Home Diet recommendation:  Cardiac diet DISCHARGE MEDICATION: Allergies as of 03/15/2024       Reactions   Zestril  [lisinopril ] Other (See Comments)   Hyperkalemia        Medication List     TAKE these medications    acetaminophen  325 MG tablet Commonly known as: TYLENOL  Take 2 tablets (650 mg total) by mouth every 6 (six) hours as needed for mild pain (pain score 1-3).   albuterol  108 (90 Base) MCG/ACT inhaler Commonly known as: VENTOLIN  HFA Inhale 2 puffs into the lungs every 6 (six) hours as needed for wheezing or shortness of breath.   amLODipine  10 MG tablet Commonly known as: NORVASC  Take 1 tablet (10 mg total) by mouth daily.   atorvastatin  10 MG tablet Commonly known as: LIPITOR Take 1 tablet (10 mg total) by mouth daily.   busPIRone  5 MG tablet Commonly known as: BUSPAR  Take 1 tablet (5 mg total) by mouth 3 (three) times daily.   calcitRIOL  0.25 MCG capsule Commonly known as: ROCALTROL  Take 1 capsule (0.25 mcg total) by mouth daily.   carvedilol  6.25 MG tablet Commonly known as: COREG  Take 1 tablet (6.25 mg total) by mouth 2 (two) times daily with a meal.   gabapentin  100 MG capsule Commonly known as: NEURONTIN  Take 1 capsule (100 mg total) by mouth 3 (three) times daily.   pantoprazole  40 MG tablet Commonly known as: PROTONIX  Take 1 tablet (40 mg total) by mouth 2 (two) times daily.   sodium bicarbonate  650 MG tablet Take 2 tablets (1,300 mg total) by mouth 2 (two) times daily.   torsemide  100 MG tablet Commonly known as: DEMADEX  Take 1 tablet (100 mg total) by mouth daily.   Trelegy Ellipta  100-62.5-25 MCG/ACT Aepb Generic drug: Fluticasone -Umeclidin-Vilant Inhale 1 puff into the lungs daily.        Follow-up  Information     [employer] Follow up.   Why: Address: [address] [place], [place], [place] [address] Phone: [phone] Hours:  Open  Closes 5:30 PM Christmas Eve might affect these hours Contact information: call the below to obtain  informaiton about housing and utility resources        [facility]. Call.   Why: Please call to be set up with a follow up and establish a PCP they have a pharmacy and finacial counselors Contact information: [address] [employer] [address] [place]  [address] [phone]               Discharge Exam: Filed Weights   03/12/24 0504 03/12/24 0852 03/12/24 1322  Weight: 55.9 kg 55.9 kg 67.4 kg   BP (!) 159/73 (BP Location: Left Arm)   Pulse 79   Temp 98.2 F (36.8 C) (Oral)   Resp 17   Ht 5' 7 (1.702 m)   Wt 67.4 kg   SpO2 90%   BMI 23.27 kg/m   Patient with no dyspnea, no wheezing,  intermittent coughing.  Neurology awake and alert ENT with no pallor or icterus Cardiovascular with S1 and S2  present and regular with no gallops, rubs or murmurs Respiratory with prolonged expiratory phase with scattered rhonchi with no wheezing or rales Abdomen not tender and soft No lower extremity edema  Right ankle ulcerated wound,  Condition at discharge: stable  The results of significant diagnostics from this hospitalization (including imaging, microbiology, ancillary and laboratory) are listed below for reference.   Imaging Studies: DG Chest 2 View Result Date: 03/10/2024 EXAM: 2 VIEW(S) XRAY OF THE CHEST 03/10/2024 01:08:00 AM COMPARISON: 02/03/2024 CLINICAL HISTORY: SOB, chest pain FINDINGS: LUNGS AND PLEURA: Moderate left pleural effusion. Small right pleural effusion. Bibasilar opacities. Pulmonary vascular congestion. No pneumothorax. HEART AND MEDIASTINUM: No acute abnormality of the cardiac and mediastinal silhouettes. BONES AND SOFT TISSUES: No acute osseous  abnormality. IMPRESSION: 1. Moderate left and small right pleural effusions similar to prior. 2. Atelectasis or pneumonia in the lung bases. Electronically signed by: Norman Gatlin MD 03/10/2024 01:12 AM EST RP Workstation: [id number]    Microbiology: Results for orders placed or performed during the hospital encounter of 03/10/24  Resp panel by RT-PCR (RSV, Flu A&B, Covid) Anterior Nasal Swab     Status: None   Collection Time: 03/10/24  2:03 AM   Specimen: Anterior Nasal Swab  Result Value Ref Range Status   SARS Coronavirus 2 by RT PCR NEGATIVE NEGATIVE Final   Influenza A by PCR NEGATIVE NEGATIVE Final   Influenza B by PCR NEGATIVE NEGATIVE Final    Comment: (NOTE) The Xpert Xpress SARS-CoV-2/FLU/RSV plus assay is intended as an aid in the diagnosis of influenza from Nasopharyngeal swab specimens and should not be used as a sole basis for treatment. Nasal washings and aspirates are unacceptable for Xpert Xpress SARS-CoV-2/FLU/RSV testing.  Fact Sheet for Patients: bloggercourse.com  Fact Sheet for Healthcare Providers: seriousbroker.it  This test is not yet approved or cleared by the [place]  FDA and has been authorized for detection and/or diagnosis of SARS-CoV-2 by FDA under an Emergency Use Authorization (EUA). This EUA will remain in effect (meaning this test can be used) for the duration of the COVID-19 declaration under Section 564(b)(1) of the Act, 21 U.S.C. section 360bbb-3(b)(1), unless the authorization is terminated or revoked.     Resp Syncytial Virus by PCR NEGATIVE NEGATIVE Final    Comment: (NOTE) Fact Sheet for Patients: bloggercourse.com  Fact Sheet for Healthcare Providers: seriousbroker.it  This test is not yet approved or cleared by the [place]  FDA and has been authorized for detection and/or diagnosis of SARS-CoV-2 by FDA under an Emergency  Use Authorization (EUA). This EUA will remain in effect (meaning this test can be used) for the duration of the COVID-19 declaration under Section 564(b)(1) of the Act, 21 U.S.C. section 360bbb-3(b)(1), unless the authorization is terminated or revoked.  Performed at [facility], [address], [place], [place] [address]     Labs: CBC: Recent Labs  Lab 03/10/24 0055 03/10/24 0714 03/11/24 0437  WBC 7.1 8.5 5.8  HGB 9.3* 8.9* 9.1*  HCT 28.6* 27.3* 26.8*  MCV 96.9 96.1 93.1  PLT 306 306 314   Basic Metabolic Panel: Recent Labs  Lab 03/10/24 0055 03/10/24 0714 03/11/24 0437 03/12/24 0539  NA 134*  --  133* 130*  K 4.4  --  4.5 4.7  CL 106  --  98 95*  CO2 14*  --  19* 19*  GLUCOSE 101*  --  88 97  BUN 56*  --  34* 52*  CREATININE 5.74* 5.69* 4.13* 4.69*  CALCIUM  8.0*  --  7.9* 7.8*  PHOS  --   --   --  4.8*   Liver Function Tests: Recent Labs  Lab 03/10/24 0203 03/12/24 0539  AST 35  --   ALT 28  --   ALKPHOS 129*  --   BILITOT 0.2  --   PROT 7.0  --   ALBUMIN  3.8 3.8   CBG: Recent Labs  Lab 03/12/24 0817 03/12/24 1420 03/12/24 1821 03/12/24 2046 03/13/24 0820  GLUCAP 94 98 100* 113* 123*    Discharge time spent: greater than 30 minutes.  Signed: Elidia Toribio Furnace, MD [facility] 03/13/2024 "

## 2024-03-14 ENCOUNTER — Other Ambulatory Visit (HOSPITAL_COMMUNITY): Payer: Self-pay

## 2024-03-14 LAB — GLUCOSE, CAPILLARY
Glucose-Capillary: 88 mg/dL (ref 70–99)
Glucose-Capillary: 91 mg/dL (ref 70–99)
Glucose-Capillary: 80 mg/dL (ref 70–99)
Glucose-Capillary: 94 mg/dL (ref 70–99)

## 2024-03-14 MED ORDER — POLYVINYL ALCOHOL 1.4 % OP SOLN
1.0000 [drp] | OPHTHALMIC | Status: DC | PRN
  Administered 2024-03-14: 1 [drp] via OPHTHALMIC
  Filled 2024-03-14: qty 15

## 2024-03-14 NOTE — Plan of Care (Signed)
  Problem: Education: Goal: Ability to describe self-care measures that may prevent or decrease complications (Diabetes Survival Skills Education) will improve Outcome: Progressing Goal: Individualized Educational Video(s) Outcome: Progressing   Problem: Coping: Goal: Ability to adjust to condition or change in health will improve Outcome: Progressing   Problem: Fluid Volume: Goal: Ability to maintain a balanced intake and output will improve Outcome: Progressing   Problem: Health Behavior/Discharge Planning: Goal: Ability to identify and utilize available resources and services will improve Outcome: Progressing Goal: Ability to manage health-related needs will improve Outcome: Progressing   Problem: Metabolic: Goal: Ability to maintain appropriate glucose levels will improve Outcome: Progressing   Problem: Nutritional: Goal: Maintenance of adequate nutrition will improve Outcome: Progressing Goal: Progress toward achieving an optimal weight will improve Outcome: Progressing   Problem: Skin Integrity: Goal: Risk for impaired skin integrity will decrease Outcome: Progressing   Problem: Tissue Perfusion: Goal: Adequacy of tissue perfusion will improve Outcome: Progressing   Problem: Education: Goal: Ability to demonstrate management of disease process will improve Outcome: Progressing Goal: Ability to verbalize understanding of medication therapies will improve Outcome: Progressing Goal: Individualized Educational Video(s) Outcome: Progressing   Problem: Activity: Goal: Capacity to carry out activities will improve Outcome: Progressing   Problem: Cardiac: Goal: Ability to achieve and maintain adequate cardiopulmonary perfusion will improve Outcome: Progressing   Problem: Education: Goal: Knowledge of General Education information will improve Description: Including pain rating scale, medication(s)/side effects and non-pharmacologic comfort measures Outcome:  Progressing   Problem: Health Behavior/Discharge Planning: Goal: Ability to manage health-related needs will improve Outcome: Progressing   Problem: Clinical Measurements: Goal: Ability to maintain clinical measurements within normal limits will improve Outcome: Progressing Goal: Will remain free from infection Outcome: Progressing Goal: Diagnostic test results will improve Outcome: Progressing Goal: Respiratory complications will improve Outcome: Progressing Goal: Cardiovascular complication will be avoided Outcome: Progressing   Problem: Activity: Goal: Risk for activity intolerance will decrease Outcome: Progressing   Problem: Nutrition: Goal: Adequate nutrition will be maintained Outcome: Progressing   Problem: Coping: Goal: Level of anxiety will decrease Outcome: Progressing   Problem: Elimination: Goal: Will not experience complications related to bowel motility Outcome: Progressing Goal: Will not experience complications related to urinary retention Outcome: Progressing   Problem: Pain Managment: Goal: General experience of comfort will improve and/or be controlled Outcome: Progressing   Problem: Safety: Goal: Ability to remain free from injury will improve Outcome: Progressing   Problem: Skin Integrity: Goal: Risk for impaired skin integrity will decrease Outcome: Progressing

## 2024-03-14 NOTE — Progress Notes (Signed)
 Patient reports of sharp chest pain 10/10 not related to her cough. She refuses to take PRN oxycodone  for her chest pain. Vitals are as follows: BP 165/72 HR 79 RR 18 SPO2 91 RA, temp 98.3 EKG obtained and notified Dr. Cleatus. Provider ordered for labs, CXR and to give the PRN nitroglycerine for chest pain.  At 2038H, Chest pain 10/10, BP 159/71, HR 79, SPO2 89 on RA, RR 18. NTG x1 given. Placed patient on O2 2LPM via [place].  At 2042H,Reassessed patient still with chest pain 10/10, BP 153/64, HR 83, RR 17, Spo2 93 on 2L [place]. NTG x2 given PRN.  At [phone], Patient reports relief on her chest pressure but pain still there 8/10. BP 145/70, HR 84, RR 19, SPO2 93 on 2L [place]. Patient requesting for her oxycodone  for her left ankle pain 10/10.  Beauford Lando, RN

## 2024-03-14 NOTE — Progress Notes (Signed)
 " [facility] Progress Note   Subjective:   Seen in room. Eating breakfast. Cp, sob overnight. Feels better this am. Worried about unstable living situation.    Objective Vitals:   03/14/24 0429 03/14/24 0756 03/14/24 0837 03/14/24 0946  BP: (!) 151/74 (!) 141/73  (!) 141/60  Pulse: 79 82    Resp: 18 18    Temp: 98.5 F (36.9 C) 98.7 F (37.1 C)    TempSrc: Oral Oral    SpO2: 92% 97% 95%   Weight: 67.2 kg     Height:        Additional Objective Labs: Basic Metabolic Panel: Recent Labs  Lab 03/10/24 0055 03/10/24 0714 03/11/24 0437 03/12/24 0539  NA 134*  --  133* 130*  K 4.4  --  4.5 4.7  CL 106  --  98 95*  CO2 14*  --  19* 19*  GLUCOSE 101*  --  88 97  BUN 56*  --  34* 52*  CREATININE 5.74* 5.69* 4.13* 4.69*  CALCIUM  8.0*  --  7.9* 7.8*  PHOS  --   --   --  4.8*   CBC: Recent Labs  Lab 03/10/24 0055 03/10/24 0714 03/11/24 0437  WBC 7.1 8.5 5.8  HGB 9.3* 8.9* 9.1*  HCT 28.6* 27.3* 26.8*  MCV 96.9 96.1 93.1  PLT 306 306 314   Blood Culture    Component Value Date/Time   SDES PERITONEAL 01/02/2024 1152   SDES PERITONEAL 01/02/2024 1152   SPECREQUEST NONE 01/02/2024 1152   SPECREQUEST NONE 01/02/2024 1152   CULT  01/02/2024 1152    NO GROWTH 5 DAYS Performed at [facility], [address], [place], [place] [address]    REPTSTATUS 01/07/2024 FINAL 01/02/2024 1152   REPTSTATUS 01/02/2024 FINAL 01/02/2024 1152     Physical Exam General: Chronically ill appearing, nad Heart: RRR Lungs: Clear, normal wob Abdomen: soft, distended  Extremities: No LE edema  Dialysis Access: R AVF   Medications:   amLODipine   10 mg Oral Daily   atorvastatin   10 mg Oral Daily   busPIRone   5 mg Oral TID   calcitRIOL   0.25 mcg Oral Daily   carvedilol   6.25 mg Oral BID WC   Chlorhexidine  Gluconate Cloth  6 each Topical Q0600   darbepoetin (ARANESP ) injection - DIALYSIS  60 mcg Subcutaneous Q Fri-1800   feeding supplement (NEPRO CARB STEADY)   237 mL Oral BID BM   fluticasone  furoate-vilanterol  1 puff Inhalation Daily   gabapentin   100 mg Oral TID   guaiFENesin   600 mg Oral BID   heparin   5,000 Units Subcutaneous Q8H   insulin  aspart  0-15 Units Subcutaneous TID WC   insulin  aspart  0-5 Units Subcutaneous QHS   pantoprazole   40 mg Oral BID   sodium bicarbonate   1,300 mg Oral BID   torsemide   100 mg Oral Daily    Dialysis Orders: no center   Assessment/Plan: Pulm edema/effusions/volume overload: Volume lowering with HD.  ESRD:  No dialysis in > 1 mo.  Comes to hospital for HD.  Received HD here 12/24, 12/26. Next HD Monday  Hypertension/volume: BP high with pulm edema/effusions. UF as tolerated. On daily torsemide .  Anemia: Hgb 9.1. Received Aranesp  60 12/26.  Metabolic bone disease: Ca/Phos acceptable. Trend labs.  Nutrition:  Alb surprisingly ok R ankle pain: per primary  Homelessness: Had been living with ex-husband but now has been kicked out. Renal navigator aware    Cynthia Ronnald Acosta  PA-C [facility] 03/14/2024,10:31 AM   "

## 2024-03-14 NOTE — Plan of Care (Signed)
" °  Problem: Metabolic: Goal: Ability to maintain appropriate glucose levels will improve Outcome: Progressing   Problem: Nutritional: Goal: Maintenance of adequate nutrition will improve Outcome: Progressing   Problem: Activity: Goal: Risk for activity intolerance will decrease Outcome: Progressing   Problem: Nutrition: Goal: Adequate nutrition will be maintained Outcome: Progressing   Problem: Elimination: Goal: Will not experience complications related to bowel motility Outcome: Progressing Goal: Will not experience complications related to urinary retention Outcome: Progressing   Problem: Safety: Goal: Ability to remain free from injury will improve Outcome: Progressing   "

## 2024-03-14 NOTE — Progress Notes (Signed)
 " Progress Note   [name] FMW:[id number] DOB: Dec 18, 1960 DOA: 03/10/2024     4 DOS: the patient was seen and examined on 03/14/2024   Brief hospital course: [name] was admitted to the hospital with the working diagnosis of volume overload in the setting or ESRD on HD.   63 yo female with the past medical history of ESRD on HD, heart failure, COPD, asthma, polysubstance abuse, hypertension and anxiety who presented with dyspnea. She had worsening symptoms of cough, wheezing, and increased sputum production for the last 3 to 4 das prior to admission, despite using nebulizer treatments at home. Because of severe symptoms EMS was called. She was found in respiratory distress, and was transported to the ED. Patient has no outpatient HD unit and she comes to the hospital for hemodialysis, last session was 03/05/24.   Na 134, K 4.4 CL 106 bicarbonate 14 glucose 101 bun 56 cr 5.74  BNP >35,000 Wbc 7,1 hgb 9.3 plt 306  Sars covid 19 negative Influenza negative RSV negative   Chest radiograph with hyperinflation, right rotation, bilateral hilar vascular congestion, bilateral pleural effusions, more left than right.   EKG 81 bpm, normal axis, qtc 504, sinus rhythm with no significant ST segment or T wave changes.   12/24 hemodialysis with ultrafiltration with good toleration  12/25 improved volume status and respiratory function.  12/26 HD today with good toleration, plan for discharge home tomorrow.  12/27 continue HD as usual in the hospital 12/28 patient having chest pain and signs of pulmonary edema, will plan for HD with ultrafiltration tomorrow prior to discharge.   Assessment and Plan: * ESRD on dialysis (HCC) Hyponatremia  Improved volume status and respiratory function after inpatient renal replacement therapy with ultrafiltration.   Patient is homeless and she has no HD unit as outpatient due to non compliance.  She gets HD in the hospital.   Continue with po  torsemide  for diuresis    Acute hypoxemic respiratory failure, due to volume overload induced pulmonary edema with bilateral pleural effusions.   Follow up chest radiograph with signs of volume overload.  Oxygenation worsening this morning, 02 saturation 90% on room air.   Metabolic bone disease continue with calcitriol  and oral bicarbonate   Plan for HD tomorrow with further ultrafiltration   Chronic diastolic CHF (congestive heart failure) (HCC) Volume overload due to ESRD Acute on chronic diastolic heart failure  Today with signs of volume overload   Continue amlodipine  and carvedilol .  Loop diuretic with torsemide  100 mg daily.  Plan for HD tomorrow with ultrafiltration   COPD with acute exacerbation (HCC) Continue bronchodilator therapy   No further wheezing  Patient was placed on bronchodilator therapy and inhaled corticosteroids.  She had a short course of systemic steroids.   She had airway clearing techniques with flutter valve and incentive spirometer Pneumonia has been ruled out.   Essential hypertension Continue blood pressure control with amlodipine  and carvedilol .  Type 2 diabetes mellitus with hyperlipidemia (HCC) Patient was placed on insulin  sliding scale for glucose cover and monitoring   Continue statin therapy  Gabapentin  for neuropathy   GERD without esophagitis Continue pantoprazole    Substance induced mood disorder (HCC) Will need follow up as outpatient  Patient is homeless   Homelessness Patient non compliant with outpatient follow up   Protein-calorie malnutrition, severe Patient was placed on nutritional supplementation         Subjective: Patient having chest pain, last night chest radiograph with signs of  pulmonary edema, this am 02 saturation 90% on room air at rest. She continue to have right ankle pain. Positive bowel movement yesterday   Physical Exam: Vitals:   03/14/24 0159 03/14/24 0429 03/14/24 0756 03/14/24 0837  BP:  (!) 162/75 (!) 151/74 (!) 141/73   Pulse: 80 79 82   Resp:  18 18   Temp:  98.5 F (36.9 C) 98.7 F (37.1 C)   TempSrc:  Oral Oral   SpO2: 96% 92% 97% 95%  Weight:  67.2 kg    Height:       Neurology awake and alert ENT with mild pallor with no icterus Cardiovascular with S1 and S2 present and regular with no gallops or rubs Respiratory with prolonged expiratory phase and bilateral rhonchi, no wheezing Abdomen soft and non tender No lower extremity edema Right ankle with medial ulcer present with no local erythema or drainage   Data Reviewed:   Family Communication: no family at the bedside   Disposition: Status is: Inpatient Remains inpatient appropriate because: HD tomorrow  Planned Discharge Destination: Home   Author: Elidia Toribio Furnace, MD 03/14/2024 9:26 AM  For on call review [email or web address].  "

## 2024-03-15 ENCOUNTER — Other Ambulatory Visit (HOSPITAL_COMMUNITY): Payer: Self-pay

## 2024-03-15 LAB — RENAL FUNCTION PANEL
Albumin: 3.7 g/dL (ref 3.5–5.0)
Anion gap: 17 — ABNORMAL HIGH (ref 5–15)
BUN: 75 mg/dL — ABNORMAL HIGH (ref 8–23)
CO2: 20 mmol/L — ABNORMAL LOW (ref 22–32)
Calcium: 8.8 mg/dL — ABNORMAL LOW (ref 8.9–10.3)
Chloride: 89 mmol/L — ABNORMAL LOW (ref 98–111)
Creatinine, Ser: 4.94 mg/dL — ABNORMAL HIGH (ref 0.44–1.00)
GFR, Estimated: 9 mL/min — ABNORMAL LOW
Glucose, Bld: 101 mg/dL — ABNORMAL HIGH (ref 70–99)
Phosphorus: 4.1 mg/dL (ref 2.5–4.6)
Potassium: 5.5 mmol/L — ABNORMAL HIGH (ref 3.5–5.1)
Sodium: 126 mmol/L — ABNORMAL LOW (ref 135–145)

## 2024-03-15 LAB — GLUCOSE, CAPILLARY
Glucose-Capillary: 98 mg/dL (ref 70–99)
Glucose-Capillary: 110 mg/dL — ABNORMAL HIGH (ref 70–99)

## 2024-03-15 MED ORDER — HEPARIN SODIUM (PORCINE) 1000 UNIT/ML DIALYSIS: 1000.0000 [IU] | INTRAMUSCULAR | Status: DC | PRN

## 2024-03-15 MED ORDER — TRAMADOL HCL 50 MG PO TABS
50.0000 mg | ORAL_TABLET | Freq: Once | ORAL | Status: AC
  Administered 2024-03-15: 50 mg via ORAL
  Filled 2024-03-15: qty 1

## 2024-03-15 MED ORDER — LIDOCAINE HCL (PF) 1 % IJ SOLN: 5.0000 mL | INTRAMUSCULAR | Status: DC | PRN

## 2024-03-15 MED ORDER — LIDOCAINE-PRILOCAINE 2.5-2.5 % EX CREA: 1.0000 | TOPICAL_CREAM | CUTANEOUS | Status: DC | PRN

## 2024-03-15 MED ORDER — AMLODIPINE BESYLATE 5 MG PO TABS
5.0000 mg | ORAL_TABLET | Freq: Every day | ORAL | Status: DC
  Filled 2024-03-15: qty 1

## 2024-03-15 MED ORDER — PENTAFLUOROPROP-TETRAFLUOROETH EX AERO
INHALATION_SPRAY | CUTANEOUS | Status: AC
  Filled 2024-03-15: qty 30

## 2024-03-15 MED ORDER — ACETAMINOPHEN 325 MG PO TABS
ORAL_TABLET | ORAL | Status: AC
  Filled 2024-03-15: qty 2

## 2024-03-15 MED ORDER — ALTEPLASE 2 MG IJ SOLR: 2.0000 mg | Freq: Once | INTRAMUSCULAR | Status: DC | PRN

## 2024-03-15 MED ORDER — ANTICOAGULANT SODIUM CITRATE 4% (200MG/5ML) IV SOLN: 5.0000 mL | Status: DC | PRN

## 2024-03-15 MED ORDER — PENTAFLUOROPROP-TETRAFLUOROETH EX AERO: 1.0000 | INHALATION_SPRAY | CUTANEOUS | Status: DC | PRN

## 2024-03-15 MED ORDER — HEPARIN SODIUM (PORCINE) 1000 UNIT/ML IJ SOLN
INTRAMUSCULAR | Status: AC
  Filled 2024-03-15: qty 7

## 2024-03-15 MED ORDER — HEPARIN SODIUM (PORCINE) 1000 UNIT/ML DIALYSIS: 20.0000 [IU]/kg | INTRAMUSCULAR | Status: DC | PRN

## 2024-03-15 NOTE — Progress Notes (Signed)
 Patient examined during hemodialysis, she is feeling better, goal fluid removal today is 4 L.  Her dyspnea has been improving, no chest pain, no abdominal or ankle pain ]  BP (!) 140/69 (BP Location: Left Arm)   Pulse 93   Temp 98.4 F (36.9 C) (Oral)   Resp 18   Ht 5' 7 (1.702 m)   Wt 57.9 kg   SpO2 97%   BMI 19.99 kg/m   Neurology awake and alert ENT with mild pallor Cardiovascular with S1 and S2 present and regular Respiratory with mild rhonchi bilaterally scattered, prolonged expiratory phase Abdomen soft and non tender No lower extremity edema   Volume overload in the setting of ESRD, non compliant with HD She has completed HD today with cumulative fluid removal of 3,989 ml.  Plan to discharge home and follow up as outpatient She has been advised to return to the hospital for hemodialysis

## 2024-03-15 NOTE — Discharge Instructions (Addendum)
 [facility]  The United Ways 211 is a great source of information about community services available. Access by dialing 2-1-1 from anywhere in [place] , or by website - pooledincome.pl.   Other Set Designer Number and Address  [facility]  Housing for homeless and needy men with substance abuse issues  5 day Covid Quarantine [phone] [address] [place], [place]  [employer] of [place]  Emergency assistance for General mills only  The [employer] [phone] Ext. [address] [place], [place]  [employer] of the  [place]  Domestic violence shelter for women and their children [phone] [place], [place]  Family Abuse Services  Domestic violence shelter for women and their children  Each family gets their own unit and can quarantine after admission. [phone] [place], [place]  [facility]  [facility]) / Resources for the  [employer] center for the homeless  Information and referral to housing resources  Counseling  Showers  Laundry  Barbershop  Phone bank  Mailroom  Computer lab  Medical clinic  Bike maintenance center  14 Day covid quarantine [phone] [address] [place], [place]  [facility] [employer] [facility]  Emergency housing  Food  Emergency financial assistance  Permanent supportive housing [phone] [address] [place], [place]  [employer]  Crisis assistance  Medication  Housing  Food  Utility assistance [phone] [address] [place], [place] [phone]  [address] [place], [place]  [employer]  of [place]     Transitional housing  Case Proofreader assistance [phone] [address] [place], [place]  [facility], The [employer] for adult men and women  Can admit with MD clearance from the hospital after positive covid test. Intake Hotline [phone] [address] [place], [place]  24-hour Crisis Line for those  Facing Homelessness  Information and referral to community resources [phone]  [employer] and additional resources.  Can admit with MD clearance after positive covid test.  Prefer online applications (blocked on [facility] computers)/ currently full.  Will be able to do intake at the office starting in May. [phone] Admin only location    [facility]) now has a full time staff to take referrals for all individuals needing shelter or housing placement. They do not do direct services or have beds, but are in charge of assessing and coordinating placement for individuals needing shelter. The phone number for coordinated entry is [phone].    [employer], Food Pantry, Engineer, Water, and [employer] Information Phone: [phone] Location [address] [place], [place], [place] [address] Eligibility Varies by program; contact directly for details Hours of Operation Monday-Friday 8am-5pm  Cost/Fees None Referral Appointments are recommended for financial assistance; walk-in accepted for food services  Low Income Energy Assistance Program Services One-time payment for heating bills eligible low-income households. Contact Information Contact for this department will be through your local Department of Social Services (DSS) Location Statewide program in [place]  Eligibility Must be low income, meet the household size requirement and only for heating source Hours of Operation Application period; December-March Cost/Fees None  Referral Please contact your local DSS for application  Public Safety Victim Advertising Copywriter,  Counseling, and  Funeral Cost for Crime Victims Contact Information Phone: [phone] // Toll free: [phone]  Website: http://www.osborne.com/ Location No Office Location Eligibility Crime must be previously reported to law enforcement and application must be submitted through their website Hours of Operation Monday-Friday 8am-5pm  Cost/Fees None Referral Contact directly for application process or visit their website  [address] [employer] Food Pantry, Software Engineer, [employer], and Financial Planner Information Phone: [phone] Email: info@wardstreetcommunityresources .org Website: wardstreetcommunityresources.org Location [address] [place], [place] [address] Eligibility Varies by program; contact directly for details Hours of Operation Varies by program; contact directly for details Cost/Fees None  Referral  Appointment and Walk-In Options Available [facility] Services Temporary Housing , Programme Researcher, Broadcasting/film/video, and [employer] Information Phone: [phone] Website: talkingapps.com.br Location [address], [place], [place] [address] Eligibility Varies by service; please contact directly for details Hours of Operation Monday- Friday 9am-5pm  Cost/Fees None Referral Walk-Ins Accepted  [facility] Services Emergency Shelter, Food Pantry, [employer], and Museum/gallery Curator Information Phone: [phone] Location [address] [place], [place] [address] Eligibility Must be a [place] resident Hours of Operation Monday-Friday 8am-4:40pm Cost/Fees None  Referral Call to make an appointment on Wednesdays from 7:30am until spots are filled.  The [employer] Pantry, Transport Planner, Homeless Shelter, and [employer] Information Phone: [phone] Email: [email or web address] .[email or web address] Website:  [email or web address].[email or web address].org Location [address], [place], [place] [address] Eligibility Varies by service Hours of Operation Monday- Friday 9am-5pm  Cost/Fees None Referral Call your local Salvation army to make appoints; walk-ins are welcomed  [facility] [place] Services Food Pantry, Corporate Investment Banker, and Corporate Investment Banker Information Phone: [phone] Email: [email or web address]@gmail .com Website: www.helpinghandshighpoint.org Location [address] [place], [place] [address] Eligibility Must meet the income requirement and have proof of residency Hours of Operation Monday- Friday 9am-12pm  Cost/Fees None Referral Walk-Ins Available  Finding Help Basic Needs Resource Guide Services Database for Housing, Food, Surveyor, Quantity, and Physicist, Medical Information Phone: [phone] Website: www.http://[email or web address]-[email or web address].[email or web address]/ Location No office location Eligibility Varies by program  Hours of Operation 27/7 online access  Cost/Fees None Referral No Referral Needed

## 2024-03-15 NOTE — Care Management Important Message (Signed)
 Important Message  Patient Details  Name: [name] MRN: [id number] Date of Birth: 03-06-1961   Important Message Given:  Yes - Medicare IM     Jennie Laneta Dragon 03/15/2024, 3:35 PM

## 2024-03-15 NOTE — Progress Notes (Signed)
" °   03/15/24 1234  Vitals  Temp 98.3 F (36.8 C)  Temp Source Oral  BP (!) 150/63  MAP (mmHg) 89  BP Location Left Arm  BP Method Automatic  Patient Position (if appropriate) Lying  Pulse Rate 89  ECG Heart Rate 89  Resp 17  Weight 57.9 kg  Type of Weight Post-Dialysis  Oxygen  Therapy  SpO2 95 %  O2 Device Nasal Cannula  O2 Flow Rate (L/min) 2 L/min  Patient Activity (if Appropriate) In bed  Pulse Oximetry Type Continuous  During Treatment Monitoring  Blood Flow Rate (mL/min) 350 mL/min  Arterial Pressure (mmHg) -134.16 mmHg  Venous Pressure (mmHg) 136.97 mmHg  TMP (mmHg) 29.7 mmHg  Ultrafiltration Rate (mL/min) 1225 mL/min  Dialysate Flow Rate (mL/min) 300 ml/min  Dialysate Potassium Concentration 2  Dialysate Calcium  Concentration 2.5  Duration of HD Treatment -hour(s) 3.49 hour(s)  Cumulative Fluid Removed (mL) per Treatment  3989.14  HD Safety Checks Performed Yes  Intra-Hemodialysis Comments Tx completed  Post Treatment  Dialyzer Clearance Clear  Liters Processed 80.4  Fluid Removed (mL) 4000 mL  Tolerated HD Treatment Yes  AVG/AVF Arterial Site Held (minutes) 6 minutes  AVG/AVF Venous Site Held (minutes) 5 minutes    "
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
## 2024-03-15 NOTE — TOC CM/SW Note (Addendum)
 Transition of Care (TOC) CM/SW Note    CSW attached shelter/housing resources to patients AVS.

## 2024-03-15 NOTE — Progress Notes (Signed)
 Patient came back from dialysis, rocking in the bed reporting pressure in her head and stomach. Patient refused all prns but wanted  her lunch. Notified MD about complaints.

## 2024-03-15 NOTE — Progress Notes (Signed)
 DISCHARGE NOTE HOME Carri M Borchers to be discharged Home per MD order. Floor RN Discussed prescriptions and follow up appointments with the patient. Prescriptions given to patient; medication list explained in detail. Patient verbalized understanding.  Skin clean, dry and intact without evidence of skin break down, no evidence of skin tears noted. IV catheter discontinued intact. Site without signs and symptoms of complications. Dressing and pressure applied. Pt denies pain at the site currently. No complaints noted.  Discharging with fistula Patient free of lines, drains, and wounds.   An After Visit Summary (AVS) was printed and given to the patient. Picked up TOC meds and taken to the discharge lounge to wiat for taxi  Patient escorted via wheelchair, and discharged home via taxi.  Peyton SHAUNNA Pepper, RN

## 2024-03-15 NOTE — Plan of Care (Signed)
  Problem: Education: Goal: Ability to describe self-care measures that may prevent or decrease complications (Diabetes Survival Skills Education) will improve Outcome: Progressing Goal: Individualized Educational Video(s) Outcome: Progressing   Problem: Coping: Goal: Ability to adjust to condition or change in health will improve Outcome: Progressing   Problem: Fluid Volume: Goal: Ability to maintain a balanced intake and output will improve Outcome: Progressing   Problem: Health Behavior/Discharge Planning: Goal: Ability to identify and utilize available resources and services will improve Outcome: Progressing Goal: Ability to manage health-related needs will improve Outcome: Progressing   Problem: Metabolic: Goal: Ability to maintain appropriate glucose levels will improve Outcome: Progressing   Problem: Nutritional: Goal: Maintenance of adequate nutrition will improve Outcome: Progressing Goal: Progress toward achieving an optimal weight will improve Outcome: Progressing   Problem: Skin Integrity: Goal: Risk for impaired skin integrity will decrease Outcome: Progressing   Problem: Tissue Perfusion: Goal: Adequacy of tissue perfusion will improve Outcome: Progressing   Problem: Education: Goal: Ability to demonstrate management of disease process will improve Outcome: Progressing Goal: Ability to verbalize understanding of medication therapies will improve Outcome: Progressing Goal: Individualized Educational Video(s) Outcome: Progressing   Problem: Activity: Goal: Capacity to carry out activities will improve Outcome: Progressing   Problem: Cardiac: Goal: Ability to achieve and maintain adequate cardiopulmonary perfusion will improve Outcome: Progressing   Problem: Education: Goal: Knowledge of General Education information will improve Description: Including pain rating scale, medication(s)/side effects and non-pharmacologic comfort measures Outcome:  Progressing   Problem: Health Behavior/Discharge Planning: Goal: Ability to manage health-related needs will improve Outcome: Progressing   Problem: Clinical Measurements: Goal: Ability to maintain clinical measurements within normal limits will improve Outcome: Progressing Goal: Will remain free from infection Outcome: Progressing Goal: Diagnostic test results will improve Outcome: Progressing Goal: Respiratory complications will improve Outcome: Progressing Goal: Cardiovascular complication will be avoided Outcome: Progressing   Problem: Activity: Goal: Risk for activity intolerance will decrease Outcome: Progressing   Problem: Nutrition: Goal: Adequate nutrition will be maintained Outcome: Progressing   Problem: Coping: Goal: Level of anxiety will decrease Outcome: Progressing   Problem: Elimination: Goal: Will not experience complications related to bowel motility Outcome: Progressing Goal: Will not experience complications related to urinary retention Outcome: Progressing   Problem: Pain Managment: Goal: General experience of comfort will improve and/or be controlled Outcome: Progressing   Problem: Safety: Goal: Ability to remain free from injury will improve Outcome: Progressing   Problem: Skin Integrity: Goal: Risk for impaired skin integrity will decrease Outcome: Progressing

## 2024-03-15 NOTE — Progress Notes (Signed)
 Patient stating her head and ears are full of pressure.  She is wheezing and reporting shortness of breathe but 96% on room air. Notified MD. [place] discharge information.

## 2024-03-15 NOTE — Progress Notes (Addendum)
 " [facility] Progress Note   Subjective:   Seen at start of dialysis - 4L UFG. Still dyspneic. Has been trying to limit fluids. Abd remains distended.  Objective Vitals:   03/15/24 0740 03/15/24 0744 03/15/24 0832 03/15/24 0845  BP: 133/81  123/70 113/70  Pulse: 81  88 72  Resp: 17  18 12   Temp: 97.7 F (36.5 C)  98.2 F (36.8 C)   TempSrc: Oral  Oral   SpO2: 92% 92% 96% 91%  Weight:      Height:       Physical Exam General: Chronically ill appearing woman, NAD. [place] O2 in place Heart: RRR Lungs: Dull B bases Abdomen: distended, soft Extremities: no LE edema Dialysis Access:  RUE AVF +t/b  Additional Objective Labs: Basic Metabolic Panel: Recent Labs  Lab 03/11/24 0437 03/12/24 0539 03/15/24 0423  NA 133* 130* 126*  K 4.5 4.7 5.5*  CL 98 95* 89*  CO2 19* 19* 20*  GLUCOSE 88 97 101*  BUN 34* 52* 75*  CREATININE 4.13* 4.69* 4.94*  CALCIUM  7.9* 7.8* 8.8*  PHOS  --  4.8* 4.1   Liver Function Tests: Recent Labs  Lab 03/10/24 0203 03/12/24 0539 03/15/24 0423  AST 35  --   --   ALT 28  --   --   ALKPHOS 129*  --   --   BILITOT 0.2  --   --   PROT 7.0  --   --   ALBUMIN  3.8 3.8 3.7   CBC: Recent Labs  Lab 03/10/24 0055 03/10/24 0714 03/11/24 0437  WBC 7.1 8.5 5.8  HGB 9.3* 8.9* 9.1*  HCT 28.6* 27.3* 26.8*  MCV 96.9 96.1 93.1  PLT 306 306 314   Studies/Results: DG Chest Portable 1 View Result Date: 03/13/2024 EXAM: 1 VIEW(S) XRAY OF THE CHEST 03/13/2024 08:32:00 PM COMPARISON: 03/10/2024 CLINICAL HISTORY: shortness of breath FINDINGS: LUNGS AND PLEURA: Diffuse bilateral interstitial opacities. Stable bibasilar airspace opacities. Stable small to moderate left and small right pleural effusions. No pneumothorax. HEART AND MEDIASTINUM: Stable cardiomegaly. Stable rightward tracheal deviation. BONES AND SOFT TISSUES: No acute osseous abnormality. IMPRESSION: 1. No significant changes. 2. Diffuse bilateral interstitial opacities with bibasilar  airspace opacities and small to moderate left and small right pleural effusions, with rightward tracheal deviation. Electronically signed by: Morgane Naveau MD 03/13/2024 09:20 PM EST RP Workstation: [id number]   DG Ankle Complete Right Result Date: 03/13/2024 CLINICAL DATA:  Ongoing right ankle pain. EXAM: DG ANKLE COMPLETE 3+V*R* COMPARISON:  02/03/2024 FINDINGS: There is no evidence of fracture, dislocation, or joint effusion. The bones are subjectively under mineralized. The ankle mortise is preserved. No erosive or bony destructive change. Small focus of patch that skin irregularity with small focus of gas overlying the medial malleolus likely ulcer. No radiopaque foreign body. Vascular calcifications are seen. IMPRESSION: 1. No acute osseous abnormality. 2. Small focus of skin irregularity with small focus of gas overlying the medial malleolus likely ulcer. No radiopaque foreign body. Electronically Signed   By: Andrea Gasman M.D.   On: 03/13/2024 13:39   Medications:  anticoagulant sodium citrate       amLODipine   10 mg Oral Daily   atorvastatin   10 mg Oral Daily   busPIRone   5 mg Oral TID   calcitRIOL   0.25 mcg Oral Daily   carvedilol   6.25 mg Oral BID WC   Chlorhexidine  Gluconate Cloth  6 each Topical Q0600   darbepoetin (ARANESP ) injection - DIALYSIS  60 mcg Subcutaneous Q  Fri-1800   feeding supplement (NEPRO CARB STEADY)  237 mL Oral BID BM   fluticasone  furoate-vilanterol  1 puff Inhalation Daily   gabapentin   100 mg Oral TID   guaiFENesin   600 mg Oral BID   heparin   5,000 Units Subcutaneous Q8H   insulin  aspart  0-15 Units Subcutaneous TID WC   insulin  aspart  0-5 Units Subcutaneous QHS   pantoprazole   40 mg Oral BID   sodium bicarbonate   1,300 mg Oral BID   torsemide   100 mg Oral Daily    Dialysis Orders no outpatient center  Assessment/Plan: Pulm edema/effusions/volume overload: Volume lowering with HD.  ESRD:  No dialysis in > 1 mo on admit.  Comes to hospital for  HD. Received HD here 12/24, 12/26. For HD now. Keeping bicarb on board as she will likely go a while after discharge before next HD although has been counseled NOT to do that. Hypertension/volume: BP high with pulm edema/effusions on admit, BP improved with meds - may need to reduce amlodipine . UF as tolerated. On daily torsemide .  Anemia of ESRD: Hgb 9.1. Received Aranesp  60 12/26.  Metabolic bone disease: Ca/Phos acceptable. Trend labs.  Nutrition:  Alb surprisingly ok R ankle pain: Xray without fracture. Per primary  Homelessness: Had been living with ex-husband but now has been kicked out. Renal navigator aware   Izetta Broadus RIGGERS 03/15/2024, 8:49 AM  [facility]   Seen and examined independently.  Agree with note and exam as documented above by physician extender and as noted here.  Seen and examined on dialysis.  Procedure supervised.  Blood pressure 113/90 and HR 77  Tolerating goal.  Will reduce amlodipine  to 5 mg daily.  She is markedly noncompliant.  Audible crackles 4 kg UF goal.  On 2 liters oxygen . R AVF in use.   She is homeless.  States that she will go into a shelter but that no one has discussed that with me.  Reached out to HD SW to see if updates as she is listed as having no outpatient HD unit.  Appreciate SW and HD SW.     Katheryn JAYSON Saba, MD 03/15/2024  9:37 AM   "

## 2024-03-23 ENCOUNTER — Inpatient Hospital Stay (HOSPITAL_COMMUNITY)
Admission: EM | Admit: 2024-03-23 | Discharge: 2024-04-15 | DRG: 291 | Disposition: A | Discharge status: Home / Self Care | Payer: Self-pay | Attending: Family Medicine | Admitting: Family Medicine

## 2024-03-23 ENCOUNTER — Encounter (HOSPITAL_COMMUNITY): Payer: Self-pay

## 2024-03-23 ENCOUNTER — Other Ambulatory Visit: Payer: Self-pay

## 2024-03-23 ENCOUNTER — Emergency Department (HOSPITAL_COMMUNITY): Payer: Self-pay

## 2024-03-23 DIAGNOSIS — F1010 Alcohol abuse, uncomplicated: Secondary | ICD-10-CM | POA: Diagnosis present

## 2024-03-23 DIAGNOSIS — E871 Hypo-osmolality and hyponatremia: Secondary | ICD-10-CM | POA: Diagnosis present

## 2024-03-23 DIAGNOSIS — K5900 Constipation, unspecified: Secondary | ICD-10-CM | POA: Diagnosis not present

## 2024-03-23 DIAGNOSIS — Z6821 Body mass index (BMI) 21.0-21.9, adult: Secondary | ICD-10-CM

## 2024-03-23 DIAGNOSIS — F1910 Other psychoactive substance abuse, uncomplicated: Secondary | ICD-10-CM | POA: Diagnosis present

## 2024-03-23 DIAGNOSIS — J9601 Acute respiratory failure with hypoxia: Principal | ICD-10-CM

## 2024-03-23 DIAGNOSIS — Z9981 Dependence on supplemental oxygen: Secondary | ICD-10-CM

## 2024-03-23 DIAGNOSIS — K219 Gastro-esophageal reflux disease without esophagitis: Secondary | ICD-10-CM | POA: Diagnosis present

## 2024-03-23 DIAGNOSIS — G9341 Metabolic encephalopathy: Secondary | ICD-10-CM | POA: Diagnosis not present

## 2024-03-23 DIAGNOSIS — G8929 Other chronic pain: Secondary | ICD-10-CM | POA: Diagnosis present

## 2024-03-23 DIAGNOSIS — Z992 Dependence on renal dialysis: Secondary | ICD-10-CM

## 2024-03-23 DIAGNOSIS — G629 Polyneuropathy, unspecified: Secondary | ICD-10-CM | POA: Diagnosis present

## 2024-03-23 DIAGNOSIS — Z888 Allergy status to other drugs, medicaments and biological substances status: Secondary | ICD-10-CM

## 2024-03-23 DIAGNOSIS — R102 Pelvic and perineal pain unspecified side: Secondary | ICD-10-CM | POA: Diagnosis present

## 2024-03-23 DIAGNOSIS — Z860101 Personal history of adenomatous and serrated colon polyps: Secondary | ICD-10-CM

## 2024-03-23 DIAGNOSIS — I2489 Other forms of acute ischemic heart disease: Secondary | ICD-10-CM | POA: Diagnosis present

## 2024-03-23 DIAGNOSIS — J9622 Acute and chronic respiratory failure with hypercapnia: Secondary | ICD-10-CM | POA: Diagnosis present

## 2024-03-23 DIAGNOSIS — Z1152 Encounter for screening for COVID-19: Secondary | ICD-10-CM

## 2024-03-23 DIAGNOSIS — D62 Acute posthemorrhagic anemia: Secondary | ICD-10-CM | POA: Diagnosis not present

## 2024-03-23 DIAGNOSIS — Z7952 Long term (current) use of systemic steroids: Secondary | ICD-10-CM

## 2024-03-23 DIAGNOSIS — Z91198 Patient's noncompliance with other medical treatment and regimen for other reason: Secondary | ICD-10-CM

## 2024-03-23 DIAGNOSIS — F419 Anxiety disorder, unspecified: Secondary | ICD-10-CM | POA: Diagnosis present

## 2024-03-23 DIAGNOSIS — E875 Hyperkalemia: Secondary | ICD-10-CM | POA: Diagnosis present

## 2024-03-23 DIAGNOSIS — F17210 Nicotine dependence, cigarettes, uncomplicated: Secondary | ICD-10-CM | POA: Diagnosis present

## 2024-03-23 DIAGNOSIS — Z66 Do not resuscitate: Secondary | ICD-10-CM | POA: Diagnosis present

## 2024-03-23 DIAGNOSIS — Z781 Physical restraint status: Secondary | ICD-10-CM

## 2024-03-23 DIAGNOSIS — Z5901 Sheltered homelessness: Secondary | ICD-10-CM

## 2024-03-23 DIAGNOSIS — D631 Anemia in chronic kidney disease: Secondary | ICD-10-CM | POA: Diagnosis present

## 2024-03-23 DIAGNOSIS — F32A Depression, unspecified: Secondary | ICD-10-CM | POA: Diagnosis present

## 2024-03-23 DIAGNOSIS — J9621 Acute and chronic respiratory failure with hypoxia: Secondary | ICD-10-CM | POA: Diagnosis not present

## 2024-03-23 DIAGNOSIS — N2581 Secondary hyperparathyroidism of renal origin: Secondary | ICD-10-CM | POA: Diagnosis present

## 2024-03-23 DIAGNOSIS — I132 Hypertensive heart and chronic kidney disease with heart failure and with stage 5 chronic kidney disease, or end stage renal disease: Principal | ICD-10-CM | POA: Diagnosis present

## 2024-03-23 DIAGNOSIS — Z515 Encounter for palliative care: Secondary | ICD-10-CM

## 2024-03-23 DIAGNOSIS — K7031 Alcoholic cirrhosis of liver with ascites: Secondary | ICD-10-CM | POA: Diagnosis present

## 2024-03-23 DIAGNOSIS — Z91158 Patient's noncompliance with renal dialysis for other reason: Secondary | ICD-10-CM

## 2024-03-23 DIAGNOSIS — Z5320 Procedure and treatment not carried out because of patient's decision for unspecified reasons: Secondary | ICD-10-CM | POA: Diagnosis not present

## 2024-03-23 DIAGNOSIS — Z8701 Personal history of pneumonia (recurrent): Secondary | ICD-10-CM

## 2024-03-23 DIAGNOSIS — E785 Hyperlipidemia, unspecified: Secondary | ICD-10-CM | POA: Diagnosis present

## 2024-03-23 DIAGNOSIS — N186 End stage renal disease: Secondary | ICD-10-CM | POA: Diagnosis present

## 2024-03-23 DIAGNOSIS — R64 Cachexia: Secondary | ICD-10-CM | POA: Diagnosis present

## 2024-03-23 DIAGNOSIS — J449 Chronic obstructive pulmonary disease, unspecified: Secondary | ICD-10-CM | POA: Diagnosis present

## 2024-03-23 DIAGNOSIS — Z8616 Personal history of COVID-19: Secondary | ICD-10-CM

## 2024-03-23 DIAGNOSIS — J4489 Other specified chronic obstructive pulmonary disease: Secondary | ICD-10-CM | POA: Diagnosis present

## 2024-03-23 DIAGNOSIS — Y92239 Unspecified place in hospital as the place of occurrence of the external cause: Secondary | ICD-10-CM | POA: Diagnosis not present

## 2024-03-23 DIAGNOSIS — I5033 Acute on chronic diastolic (congestive) heart failure: Secondary | ICD-10-CM | POA: Diagnosis present

## 2024-03-23 DIAGNOSIS — R627 Adult failure to thrive: Secondary | ICD-10-CM | POA: Diagnosis present

## 2024-03-23 DIAGNOSIS — E8729 Other acidosis: Secondary | ICD-10-CM | POA: Diagnosis not present

## 2024-03-23 DIAGNOSIS — W19XXXA Unspecified fall, initial encounter: Secondary | ICD-10-CM | POA: Diagnosis not present

## 2024-03-23 DIAGNOSIS — Z833 Family history of diabetes mellitus: Secondary | ICD-10-CM

## 2024-03-23 DIAGNOSIS — I340 Nonrheumatic mitral (valve) insufficiency: Secondary | ICD-10-CM | POA: Diagnosis present

## 2024-03-23 DIAGNOSIS — Z79899 Other long term (current) drug therapy: Secondary | ICD-10-CM

## 2024-03-23 DIAGNOSIS — Z91148 Patient's other noncompliance with medication regimen for other reason: Secondary | ICD-10-CM

## 2024-03-23 DIAGNOSIS — J9811 Atelectasis: Secondary | ICD-10-CM | POA: Diagnosis present

## 2024-03-23 DIAGNOSIS — E8770 Fluid overload, unspecified: Secondary | ICD-10-CM | POA: Diagnosis present

## 2024-03-23 DIAGNOSIS — S3011XA Contusion of abdominal wall, initial encounter: Secondary | ICD-10-CM | POA: Diagnosis not present

## 2024-03-23 DIAGNOSIS — Z634 Disappearance and death of family member: Secondary | ICD-10-CM

## 2024-03-23 DIAGNOSIS — Z5972 Insufficient welfare support: Secondary | ICD-10-CM

## 2024-03-23 DIAGNOSIS — I509 Heart failure, unspecified: Secondary | ICD-10-CM

## 2024-03-23 LAB — COMPREHENSIVE METABOLIC PANEL WITH GFR
ALT: 31 U/L (ref 0–44)
AST: 17 U/L (ref 15–41)
Albumin: 3.8 g/dL (ref 3.5–5.0)
Alkaline Phosphatase: 138 U/L — ABNORMAL HIGH (ref 38–126)
Anion gap: 21 — ABNORMAL HIGH (ref 5–15)
BUN: 93 mg/dL — ABNORMAL HIGH (ref 8–23)
CO2: 18 mmol/L — ABNORMAL LOW (ref 22–32)
Calcium: 7.7 mg/dL — ABNORMAL LOW (ref 8.9–10.3)
Chloride: 83 mmol/L — ABNORMAL LOW (ref 98–111)
Creatinine, Ser: 5.8 mg/dL — ABNORMAL HIGH (ref 0.44–1.00)
GFR, Estimated: 8 mL/min — ABNORMAL LOW
Glucose, Bld: 93 mg/dL (ref 70–99)
Potassium: 5.3 mmol/L — ABNORMAL HIGH (ref 3.5–5.1)
Sodium: 123 mmol/L — ABNORMAL LOW (ref 135–145)
Total Bilirubin: 0.4 mg/dL (ref 0.0–1.2)
Total Protein: 7.2 g/dL (ref 6.5–8.1)

## 2024-03-23 LAB — TROPONIN T, HIGH SENSITIVITY
Troponin T High Sensitivity: 148 ng/L (ref 0–19)
Troponin T High Sensitivity: 147 ng/L (ref 0–19)

## 2024-03-23 LAB — CBC WITH DIFFERENTIAL/PLATELET
Abs Immature Granulocytes: 0.08 K/uL — ABNORMAL HIGH (ref 0.00–0.07)
Basophils Absolute: 0 K/uL (ref 0.0–0.1)
Basophils Relative: 0 %
Eosinophils Absolute: 0.1 K/uL (ref 0.0–0.5)
Eosinophils Relative: 1 %
HCT: 22.7 % — ABNORMAL LOW (ref 36.0–46.0)
Hemoglobin: 7.2 g/dL — ABNORMAL LOW (ref 12.0–15.0)
Immature Granulocytes: 1 %
Lymphocytes Relative: 8 %
Lymphs Abs: 0.8 K/uL (ref 0.7–4.0)
MCH: 32.4 pg (ref 26.0–34.0)
MCHC: 31.7 g/dL (ref 30.0–36.0)
MCV: 102.3 fL — ABNORMAL HIGH (ref 80.0–100.0)
Monocytes Absolute: 0.7 K/uL (ref 0.1–1.0)
Monocytes Relative: 8 %
Neutro Abs: 7.6 K/uL (ref 1.7–7.7)
Neutrophils Relative %: 82 %
Platelets: 459 K/uL — ABNORMAL HIGH (ref 150–400)
RBC: 2.22 MIL/uL — ABNORMAL LOW (ref 3.87–5.11)
RDW: 17.3 % — ABNORMAL HIGH (ref 11.5–15.5)
WBC: 9.2 K/uL (ref 4.0–10.5)
nRBC: 0 % (ref 0.0–0.2)

## 2024-03-23 LAB — PRO BRAIN NATRIURETIC PEPTIDE: Pro Brain Natriuretic Peptide: >35000 pg/mL — ABNORMAL HIGH

## 2024-03-23 NOTE — ED Triage Notes (Signed)
 Bil leg swelling, audible rhonchi,

## 2024-03-23 NOTE — ED Provider Triage Note (Signed)
 Emergency Medicine Provider Triage Evaluation Note  [name] , a 64 y.o. female  was evaluated in triage.  Pt complains of chest pain and shortness of breath.  Notes he is on dialysis and has not had for 2 weeks..  States she cannot get to the clinic so she just comes to the ED when she feels bed.  Symptoms began yesterday.  History of CHF, not on medication.  Review of Systems  Positive: Chest pain, shortness of breath Negative: Headache, dizziness, syncope  Physical Exam  BP (!) 145/74   Pulse 95   Temp 98.3 F (36.8 C) (Oral)   Resp (!) 28   SpO2 99%  Gen:   Awake, uncomfortable in chair Resp:  Tachypnea, audible rhonchi MSK:   Moves extremities without difficulty  Other:    Medical Decision Making  Medically screening exam initiated at 6:24 PM.  Appropriate orders placed.  [name] was informed that the remainder of the evaluation will be completed by another provider, this initial triage assessment does not replace that evaluation, and the importance of remaining in the ED until their evaluation is complete.     Neysa Thersia RAMAN, [place] 03/23/24 1849

## 2024-03-23 NOTE — ED Triage Notes (Signed)
 Pt c/o sob, cp, and productive cough; states she has missed dialysis x 2 weeks

## 2024-03-24 ENCOUNTER — Inpatient Hospital Stay (HOSPITAL_COMMUNITY)

## 2024-03-24 DIAGNOSIS — Y92239 Unspecified place in hospital as the place of occurrence of the external cause: Secondary | ICD-10-CM | POA: Diagnosis not present

## 2024-03-24 DIAGNOSIS — F32A Depression, unspecified: Secondary | ICD-10-CM | POA: Diagnosis present

## 2024-03-24 DIAGNOSIS — Z8616 Personal history of COVID-19: Secondary | ICD-10-CM | POA: Diagnosis not present

## 2024-03-24 DIAGNOSIS — N186 End stage renal disease: Secondary | ICD-10-CM | POA: Diagnosis present

## 2024-03-24 DIAGNOSIS — N2581 Secondary hyperparathyroidism of renal origin: Secondary | ICD-10-CM | POA: Diagnosis present

## 2024-03-24 DIAGNOSIS — K7031 Alcoholic cirrhosis of liver with ascites: Secondary | ICD-10-CM | POA: Diagnosis present

## 2024-03-24 DIAGNOSIS — Z66 Do not resuscitate: Secondary | ICD-10-CM | POA: Diagnosis present

## 2024-03-24 DIAGNOSIS — Z1152 Encounter for screening for COVID-19: Secondary | ICD-10-CM | POA: Diagnosis not present

## 2024-03-24 DIAGNOSIS — F1910 Other psychoactive substance abuse, uncomplicated: Secondary | ICD-10-CM | POA: Diagnosis present

## 2024-03-24 DIAGNOSIS — G9341 Metabolic encephalopathy: Secondary | ICD-10-CM | POA: Diagnosis not present

## 2024-03-24 DIAGNOSIS — I340 Nonrheumatic mitral (valve) insufficiency: Secondary | ICD-10-CM | POA: Diagnosis present

## 2024-03-24 DIAGNOSIS — Z992 Dependence on renal dialysis: Secondary | ICD-10-CM | POA: Diagnosis not present

## 2024-03-24 DIAGNOSIS — E871 Hypo-osmolality and hyponatremia: Secondary | ICD-10-CM | POA: Diagnosis present

## 2024-03-24 DIAGNOSIS — I132 Hypertensive heart and chronic kidney disease with heart failure and with stage 5 chronic kidney disease, or end stage renal disease: Secondary | ICD-10-CM | POA: Diagnosis present

## 2024-03-24 DIAGNOSIS — Z515 Encounter for palliative care: Secondary | ICD-10-CM | POA: Diagnosis not present

## 2024-03-24 DIAGNOSIS — I5033 Acute on chronic diastolic (congestive) heart failure: Secondary | ICD-10-CM | POA: Diagnosis present

## 2024-03-24 DIAGNOSIS — J9621 Acute and chronic respiratory failure with hypoxia: Secondary | ICD-10-CM | POA: Diagnosis not present

## 2024-03-24 DIAGNOSIS — D62 Acute posthemorrhagic anemia: Secondary | ICD-10-CM | POA: Diagnosis not present

## 2024-03-24 DIAGNOSIS — R64 Cachexia: Secondary | ICD-10-CM | POA: Diagnosis present

## 2024-03-24 DIAGNOSIS — E8770 Fluid overload, unspecified: Secondary | ICD-10-CM | POA: Diagnosis present

## 2024-03-24 DIAGNOSIS — D631 Anemia in chronic kidney disease: Secondary | ICD-10-CM | POA: Diagnosis present

## 2024-03-24 DIAGNOSIS — F1010 Alcohol abuse, uncomplicated: Secondary | ICD-10-CM | POA: Diagnosis present

## 2024-03-24 DIAGNOSIS — J4489 Other specified chronic obstructive pulmonary disease: Secondary | ICD-10-CM | POA: Diagnosis present

## 2024-03-24 DIAGNOSIS — J9622 Acute and chronic respiratory failure with hypercapnia: Secondary | ICD-10-CM | POA: Diagnosis present

## 2024-03-24 DIAGNOSIS — I2489 Other forms of acute ischemic heart disease: Secondary | ICD-10-CM | POA: Diagnosis present

## 2024-03-24 DIAGNOSIS — W19XXXA Unspecified fall, initial encounter: Secondary | ICD-10-CM | POA: Diagnosis not present

## 2024-03-24 LAB — BASIC METABOLIC PANEL WITH GFR
Anion gap: 20 — ABNORMAL HIGH (ref 5–15)
BUN: 93 mg/dL — ABNORMAL HIGH (ref 8–23)
CO2: 20 mmol/L — ABNORMAL LOW (ref 22–32)
Calcium: 7.6 mg/dL — ABNORMAL LOW (ref 8.9–10.3)
Chloride: 84 mmol/L — ABNORMAL LOW (ref 98–111)
Creatinine, Ser: 5.88 mg/dL — ABNORMAL HIGH (ref 0.44–1.00)
GFR, Estimated: 8 mL/min — ABNORMAL LOW
Glucose, Bld: 99 mg/dL (ref 70–99)
Potassium: 5.2 mmol/L — ABNORMAL HIGH (ref 3.5–5.1)
Sodium: 125 mmol/L — ABNORMAL LOW (ref 135–145)

## 2024-03-24 LAB — PREPARE RBC (CROSSMATCH)

## 2024-03-24 LAB — CBC
HCT: 22.4 % — ABNORMAL LOW (ref 36.0–46.0)
Hemoglobin: 7 g/dL — ABNORMAL LOW (ref 12.0–15.0)
MCH: 31.8 pg (ref 26.0–34.0)
MCHC: 31.3 g/dL (ref 30.0–36.0)
MCV: 101.8 fL — ABNORMAL HIGH (ref 80.0–100.0)
Platelets: 424 K/uL — ABNORMAL HIGH (ref 150–400)
RBC: 2.2 MIL/uL — ABNORMAL LOW (ref 3.87–5.11)
RDW: 17.1 % — ABNORMAL HIGH (ref 11.5–15.5)
WBC: 7.8 K/uL (ref 4.0–10.5)
nRBC: 0 % (ref 0.0–0.2)

## 2024-03-24 LAB — RESP PANEL BY RT-PCR (RSV, FLU A&B, COVID)  RVPGX2
Influenza A by PCR: NEGATIVE
Influenza B by PCR: NEGATIVE
Resp Syncytial Virus by PCR: NEGATIVE
SARS Coronavirus 2 by RT PCR: NEGATIVE

## 2024-03-24 MED ORDER — CALCITRIOL 0.25 MCG PO CAPS
0.2500 ug | ORAL_CAPSULE | Freq: Every day | ORAL | Status: DC
  Administered 2024-03-24 – 2024-04-15 (×21): 0.25 ug via ORAL
  Filled 2024-03-24 (×23): qty 1

## 2024-03-24 MED ORDER — LORAZEPAM 2 MG/ML IJ SOLN
0.5000 mg | INTRAMUSCULAR | Status: AC | PRN
  Administered 2024-03-24: 0.5 mg via INTRAVENOUS
  Filled 2024-03-24: qty 1

## 2024-03-24 MED ORDER — ACETAMINOPHEN 325 MG PO TABS
650.0000 mg | ORAL_TABLET | Freq: Four times a day (QID) | ORAL | Status: DC | PRN
  Administered 2024-03-25 – 2024-04-14 (×14): 650 mg via ORAL
  Filled 2024-03-24 (×12): qty 2

## 2024-03-24 MED ORDER — BUSPIRONE HCL 5 MG PO TABS
5.0000 mg | ORAL_TABLET | Freq: Three times a day (TID) | ORAL | Status: DC
  Administered 2024-03-24 – 2024-04-01 (×18): 5 mg via ORAL
  Filled 2024-03-24 (×21): qty 1

## 2024-03-24 MED ORDER — LIDOCAINE HCL (PF) 1 % IJ SOLN: 5.0000 mL | INTRAMUSCULAR | Status: DC | PRN

## 2024-03-24 MED ORDER — IPRATROPIUM-ALBUTEROL 0.5-2.5 (3) MG/3ML IN SOLN
3.0000 mL | Freq: Four times a day (QID) | RESPIRATORY_TRACT | Status: DC | PRN
  Administered 2024-03-25 – 2024-04-14 (×21): 3 mL via RESPIRATORY_TRACT
  Filled 2024-03-24 (×17): qty 3

## 2024-03-24 MED ORDER — FENTANYL CITRATE (PF) 50 MCG/ML IJ SOSY
25.0000 ug | PREFILLED_SYRINGE | Freq: Once | INTRAMUSCULAR | Status: AC
  Administered 2024-03-24: 25 ug via INTRAVENOUS
  Filled 2024-03-24: qty 1

## 2024-03-24 MED ORDER — BUDESON-GLYCOPYRROL-FORMOTEROL 160-9-4.8 MCG/ACT IN AERO
2.0000 | INHALATION_SPRAY | Freq: Two times a day (BID) | RESPIRATORY_TRACT | Status: DC
  Administered 2024-03-25 – 2024-04-15 (×34): 2 via RESPIRATORY_TRACT
  Filled 2024-03-24 (×3): qty 5.9

## 2024-03-24 MED ORDER — CHLORHEXIDINE GLUCONATE CLOTH 2 % EX PADS: 6.0000 | MEDICATED_PAD | Freq: Every day | CUTANEOUS | Status: DC

## 2024-03-24 MED ORDER — HEPARIN SODIUM (PORCINE) 1000 UNIT/ML DIALYSIS: 1000.0000 [IU] | INTRAMUSCULAR | Status: DC | PRN

## 2024-03-24 MED ORDER — ALTEPLASE 2 MG IJ SOLR: 2.0000 mg | Freq: Once | INTRAMUSCULAR | Status: DC | PRN

## 2024-03-24 MED ORDER — SODIUM ZIRCONIUM CYCLOSILICATE 10 G PO PACK
10.0000 g | PACK | Freq: Once | ORAL | Status: AC
  Administered 2024-03-24: 10 g via ORAL
  Filled 2024-03-24: qty 1

## 2024-03-24 MED ORDER — CARVEDILOL 6.25 MG PO TABS
6.2500 mg | ORAL_TABLET | Freq: Two times a day (BID) | ORAL | Status: DC
  Administered 2024-03-24 – 2024-04-09 (×28): 6.25 mg via ORAL
  Filled 2024-03-24 (×10): qty 1
  Filled 2024-03-24: qty 2
  Filled 2024-03-24 (×2): qty 1
  Filled 2024-03-24: qty 2
  Filled 2024-03-24: qty 1
  Filled 2024-03-24: qty 2
  Filled 2024-03-24 (×14): qty 1

## 2024-03-24 MED ORDER — IPRATROPIUM-ALBUTEROL 0.5-2.5 (3) MG/3ML IN SOLN
3.0000 mL | Freq: Once | RESPIRATORY_TRACT | Status: AC
  Administered 2024-03-24: 3 mL via RESPIRATORY_TRACT
  Filled 2024-03-24: qty 3

## 2024-03-24 MED ORDER — GABAPENTIN 100 MG PO CAPS
100.0000 mg | ORAL_CAPSULE | Freq: Three times a day (TID) | ORAL | Status: DC
  Administered 2024-03-24 – 2024-04-01 (×18): 100 mg via ORAL
  Filled 2024-03-24 (×20): qty 1

## 2024-03-24 MED ORDER — ACETAMINOPHEN 650 MG RE SUPP: 650.0000 mg | Freq: Four times a day (QID) | RECTAL | Status: DC | PRN

## 2024-03-24 MED ORDER — ALBUTEROL SULFATE (2.5 MG/3ML) 0.083% IN NEBU
2.5000 mg | INHALATION_SOLUTION | RESPIRATORY_TRACT | Status: DC
  Administered 2024-03-24 (×2): 2.5 mg via RESPIRATORY_TRACT
  Filled 2024-03-24 (×2): qty 3

## 2024-03-24 MED ORDER — TORSEMIDE 20 MG PO TABS
100.0000 mg | ORAL_TABLET | Freq: Every day | ORAL | Status: DC
  Administered 2024-03-24 – 2024-04-15 (×20): 100 mg via ORAL
  Filled 2024-03-24: qty 5
  Filled 2024-03-24: qty 1
  Filled 2024-03-24 (×2): qty 5
  Filled 2024-03-24 (×2): qty 1
  Filled 2024-03-24 (×2): qty 5
  Filled 2024-03-24 (×9): qty 1
  Filled 2024-03-24 (×4): qty 5

## 2024-03-24 MED ORDER — PANTOPRAZOLE SODIUM 40 MG PO TBEC
40.0000 mg | DELAYED_RELEASE_TABLET | Freq: Two times a day (BID) | ORAL | Status: DC
  Administered 2024-03-24 – 2024-04-15 (×38): 40 mg via ORAL
  Filled 2024-03-24 (×41): qty 1

## 2024-03-24 MED ORDER — OXYCODONE-ACETAMINOPHEN 5-325 MG PO TABS
1.0000 | ORAL_TABLET | Freq: Once | ORAL | Status: AC
  Administered 2024-03-24: 1 via ORAL
  Filled 2024-03-24: qty 1

## 2024-03-24 MED ORDER — NEPRO/CARBSTEADY PO LIQD: 237.0000 mL | ORAL | Status: DC | PRN

## 2024-03-24 MED ORDER — ATORVASTATIN CALCIUM 10 MG PO TABS
10.0000 mg | ORAL_TABLET | Freq: Every day | ORAL | Status: DC
  Administered 2024-03-24 – 2024-04-15 (×21): 10 mg via ORAL
  Filled 2024-03-24 (×22): qty 1

## 2024-03-24 MED ORDER — SODIUM BICARBONATE 650 MG PO TABS
1300.0000 mg | ORAL_TABLET | Freq: Two times a day (BID) | ORAL | Status: DC
  Administered 2024-03-24 – 2024-03-25 (×3): 1300 mg via ORAL
  Filled 2024-03-24 (×3): qty 2

## 2024-03-24 MED ORDER — SODIUM CHLORIDE 0.9% IV SOLUTION: Freq: Once | INTRAVENOUS | Status: DC

## 2024-03-24 MED ORDER — ANTICOAGULANT SODIUM CITRATE 4% (200MG/5ML) IV SOLN: 5.0000 mL | Status: DC | PRN

## 2024-03-24 MED ORDER — PENTAFLUOROPROP-TETRAFLUOROETH EX AERO: 1.0000 | INHALATION_SPRAY | CUTANEOUS | Status: DC | PRN

## 2024-03-24 MED ORDER — AMLODIPINE BESYLATE 10 MG PO TABS
10.0000 mg | ORAL_TABLET | Freq: Every day | ORAL | Status: DC
  Administered 2024-03-24 – 2024-04-09 (×15): 10 mg via ORAL
  Filled 2024-03-24 (×9): qty 1
  Filled 2024-03-24: qty 2
  Filled 2024-03-24 (×2): qty 1
  Filled 2024-03-24: qty 2
  Filled 2024-03-24 (×3): qty 1

## 2024-03-24 MED ORDER — LIDOCAINE-PRILOCAINE 2.5-2.5 % EX CREA: 1.0000 | TOPICAL_CREAM | CUTANEOUS | Status: DC | PRN

## 2024-03-24 NOTE — ED Notes (Signed)
 Patient has refused to be set up to monitor. This NT, attempted to set up patient to monitor and patient rejected her hand from me multiple times. As I was attempting this patient then stated  dumb bitch get out of here. RN has been made aware of patient refusing to be put on monitor, and use of verbiage. At the moment patient is still yelling.

## 2024-03-24 NOTE — TOC CM/SW Note (Signed)
 TOC consult received for homelessness. Per previous notes, patient has been given multiple resources in the past. It is unclear at this time if patient has an outpt HD chair. Follow up to be completed with patient as appropriate.   Merilee Batty, MSN, RN Case Management [phone]

## 2024-03-24 NOTE — ED Notes (Addendum)
 This RN entered patient's room, patient extremely upset that she has been brought back down to the ED and not taken to a room upstairs. Explained to patient that she is set to be admitted but does not have an inpatient room at this time so this is where she will stay until one is available. Patient also upset that she has not been provided a meal from the cafeteria yet. Patient demanding she receive her medications immediately, states she is very anxious currently and she does not want to wait any longer for them. Patient provided with a turkey sandwich, apple sauce, and water . Patient agrees to be placed on the monitor and have VS taken at this time.

## 2024-03-24 NOTE — ED Provider Notes (Signed)
 "  Emergency Department Provider Note   I have reviewed the triage vital signs and the nursing notes.   HISTORY  Chief Complaint Shortness of Breath   HPI [name] is a 64 y.o. female past medical history reviewed below including COPD, congestive heart failure, ESRD presents to the emergency department with medication noncompliance and no dialysis in the past 2 weeks.  Describes central chest discomfort with shortness of breath and a productive cough.  No fevers.  She also notes some leg swelling bilaterally.  She has been off all medications in the past 2 weeks as well due to significant social challenges.   Past Medical History:  Diagnosis Date   Acute on chronic blood loss anemia 08/01/2022   Acute on chronic diastolic CHF (congestive heart failure) (HCC) 08/12/2022   Acute on chronic respiratory failure with hypoxia (HCC) 05/16/2023   Acute pulmonary edema (HCC) 05/16/2023   Acute seasonal allergic rhinitis due to pollen 02/22/2016   Alcohol  abuse    Allergy    Anxiety    Arteriovenous fistula infection, initial encounter 04/16/2023   Arthritis    Asthma    Cannabis abuse    Cocaine abuse (HCC)    COPD (chronic obstructive pulmonary disease) (HCC)    COPD with acute exacerbation (HCC) 10/25/2022   COVID 05/16/2023   Depression    Drug addiction (HCC)    GERD (gastroesophageal reflux disease)    GIB (gastrointestinal bleeding) 08/19/2019   Heart murmur    Heme positive stool 08/14/2022   Homelessness    Hx of adenomatous colonic polyps    Hyperlipidemia    Hypertension    pt stated every once in a while BP will be high but has not been prescribed medication for HTN.    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 Respiratory: Positive shortness of breath. Gastrointestinal: No abdominal pain.  No nausea, no vomiting.  Musculoskeletal: Negative for back pain. Skin: Negative for rash. Neurological: Negative for headaches.  ____________________________________________   PHYSICAL EXAM:  VITAL SIGNS: ED Triage Vitals  Encounter Vitals Group     BP 03/23/24 1816 (!) 145/74     Pulse Rate 03/23/24 1816 95     Resp 03/23/24 1816 (!) 28     Temp 03/23/24 1822 98.3 F (36.8 C)     Temp Source 03/23/24 1822 Oral     SpO2 03/23/24 1816 99 %   Constitutional: Alert and oriented. Patient with increased work of breathing but able to provide a history and follow commands.  Eyes: Conjunctivae are normal.  Head: Atraumatic. Nose: No congestion/rhinnorhea. Mouth/Throat: Mucous membranes are moist.  Oropharynx non-erythematous. Neck: No stridor.   Cardiovascular: Normal rate, regular rhythm. Good peripheral circulation. Grossly normal heart sounds.   Respiratory: Increased respiratory effort.  No retractions. Lungs with crackles at the bases bilaterally.  Gastrointestinal: Soft and nontender. No distention.  Musculoskeletal: No lower extremity tenderness. No gross deformities of extremities. Neurologic:  Normal speech and language.  Skin:  Skin is warm, dry and intact. No rash noted.  ____________________________________________   LABS (all  labs ordered are listed, but only abnormal results are displayed)  Labs Reviewed  COMPREHENSIVE METABOLIC PANEL WITH GFR - Abnormal; Notable for the following components:      Result Value   Sodium 123 (*)    Potassium 5.3 (*)    Chloride 83 (*)    CO2 18 (*)    BUN 93 (*)    Creatinine, Ser 5.80 (*)    Calcium  7.7 (*)    Alkaline Phosphatase 138 (*)    GFR, Estimated  8 (*)    Anion gap 21 (*)    All other components within normal limits  CBC WITH DIFFERENTIAL/PLATELET - Abnormal; Notable for the following components:   RBC 2.22 (*)    Hemoglobin 7.2 (*)    HCT 22.7 (*)    MCV 102.3 (*)    RDW 17.3 (*)    Platelets 459 (*)    Abs Immature Granulocytes 0.08 (*)    All other components within normal limits  PRO BRAIN NATRIURETIC PEPTIDE - Abnormal; Notable for the following components:   Pro Brain Natriuretic Peptide >35,000.0 (*)    All other components within normal limits  TROPONIN T, HIGH SENSITIVITY - Abnormal; Notable for the following components:   Troponin T High Sensitivity 148 (*)    All other components within normal limits  TROPONIN T, HIGH SENSITIVITY - Abnormal; Notable for the following components:   Troponin T High Sensitivity 147 (*)    All other components within normal limits  RESP PANEL BY RT-PCR (RSV, FLU A&B, COVID)  RVPGX2   ____________________________________________  EKG   EKG Interpretation Date/Time:  Wednesday March 24 2024 01:46:39 EST Ventricular Rate:  92 PR Interval:  170 QRS Duration:  94 QT Interval:  358 QTC Calculation: 443 R Axis:   65  Text Interpretation: Sinus rhythm Ventricular premature complex Aberrant conduction of SV complex(es) Borderline repolarization abnormality Confirmed by Darra Chew [phone]) on 03/24/2024 1:51:27 AM        ____________________________________________  RADIOLOGY  DG Chest 2 View Result Date: 03/23/2024 EXAM: 2 VIEW(S) XRAY OF THE CHEST 03/23/2024 07:11:00 PM COMPARISON: 03/13/2024 CLINICAL HISTORY: shob shob FINDINGS: LUNGS AND PLEURA: Bibasilar opacities, favor atelectasis. Small to moderate bilateral pleural effusions, left larger than right. No pneumothorax. HEART AND MEDIASTINUM: Cardiomegaly, vascular congestion. BONES AND SOFT TISSUES: No acute osseous abnormality. IMPRESSION: 1. Vascular congestion with small to moderate bilateral pleural effusions, left larger  than right. 2. Cardiomegaly. 3. Bibasilar opacities, favor atelectasis. Electronically signed by: Franky Crease MD 03/23/2024 07:26 PM EST RP Workstation: [id number]    ____________________________________________   PROCEDURES  Procedure(s) performed:   Procedures  CRITICAL CARE Performed by: Chew KANDICE Darra Total critical care time: 35 minutes Critical care time was exclusive of separately billable procedures and treating other patients. Critical care was necessary to treat or prevent imminent or life-threatening deterioration. Critical care was time spent personally by me on the following activities: development of treatment plan with patient and/or surrogate as well as nursing, discussions with consultants, evaluation of patient's response to treatment, examination of patient, obtaining history from patient or surrogate, ordering and performing treatments and interventions, ordering and review of laboratory studies, ordering and review of radiographic studies, pulse oximetry and re-evaluation of patient's condition.  Chew Darra, MD Emergency Medicine  ____________________________________________   INITIAL IMPRESSION / ASSESSMENT AND PLAN / ED COURSE  Pertinent labs & imaging results that were available during my care of the patient were reviewed by me and considered in my medical decision  making (see chart for details).   This patient is Presenting for Evaluation of SOB, which does require a range of treatment options, and is a complaint that involves a high risk of morbidity and mortality.  The Differential Diagnoses includes volume overload, CHF exacerbation, COPD exacerbation, PE, ACS, Flu/COVID, etc.  Critical Interventions-    Medications  albuterol  (PROVENTIL ) (2.5 MG/3ML) 0.083% nebulizer solution 2.5 mg (has no administration in time range)  ipratropium-albuterol  (DUONEB) 0.5-2.5 (3) MG/3ML nebulizer solution 3 mL (3 mLs Nebulization Given 03/24/24 0207)   oxyCODONE -acetaminophen  (PERCOCET/ROXICET) 5-325 MG per tablet 1 tablet (1 tablet Oral Given 03/24/24 0207)    Reassessment after intervention:  Breathing improved on BiPAP.   Clinical Laboratory Tests Ordered, included CBC without leukocytosis.  Anemia to 7.2.  Creatinine consistent with ESRD with potassium of 5.3.  proBNP elevated to greater than 35,000 with troponins in the 147 range, likely demand ischemia.  Doubt ACS.   Radiologic Tests Ordered, included CXR. I independently interpreted the images and agree with radiology interpretation.   Cardiac Monitor Tracing which shows NSR.    Social Determinants of Health Risk patient is a smoker and currently with housing instability.   Consult complete with Nephrology, Dr. Dolan. They will coordinate HD first thing in the AM.   Medical Decision Making: Summary:  Patient presents emergency department with shortness of breath.  Some component of volume overload either from CHF or missing dialysis for the past 2 weeks is notable.  Plan for DuoNebs.  She is currently on 6 L nasal cannula.   Reevaluation with update and discussion with patient. Mild improvement after Duoneb. Will start BiPAP.   02:44 AM  Patient re-evaluated and looking much more comfortable from a respiratory standpoint.   Patient's presentation is most consistent with acute presentation with potential threat to life or bodily function.   Disposition: admit  ____________________________________________  FINAL CLINICAL IMPRESSION(S) / ED DIAGNOSES  Final diagnoses:  Acute respiratory failure with hypoxia (HCC)    Note:  This document was prepared using Dragon voice recognition software and may include unintentional dictation errors.  Fonda Law, MD, [facility] Emergency Medicine    Teagan Heidrick, Fonda MATSU, MD 03/24/24 [phone]  "

## 2024-03-24 NOTE — ED Notes (Signed)
 Pt removed Bi-Pap and becoming agitated. Pt redirected and RT coming to bedside to place pt back on Bi-Pap.

## 2024-03-24 NOTE — Progress Notes (Signed)
 I came by to check on the patient & check her Bipap but she was not in the room. She apparently took herself off the Bipap & ran down the hall to the bathroom. NT brought her back in wheelchair & got her back in bed. I placed her back on the monitor & SpO2 81%. I tried to place back on the Bipap & she absolutely refused & was cussing at me & the NT repeatedly. I placed her on 4L [place] because that's all she would let me do.

## 2024-03-24 NOTE — Progress Notes (Signed)
 Subjective: Patient admitted this morning, see detailed H&P by Dr Alfornia  64 y.o. female with medical history significant of ESRD on HD with history of noncompliance due to homelessness, HFpEF, COPD, polysubstance abuse, hypertension, hyperlipidemia, GERD, anxiety, depression, anemia, hepatic cirrhosis presenting with complaints of chest pain and shortness of breath.  Patient is a poor historian.  She is reporting substernal chest heaviness and shortness of breath for several days.  She was admitted to the hospital last month for volume overload and has not gone for dialysis since she was discharged 2 weeks ago.  She reports taking all of her medications but thinks she will start running out of them soon.    Vitals:   03/24/24 0655 03/24/24 0800  BP:    Pulse: 83 (!) 27  Resp: 11 16  Temp:    SpO2: 100% (!) 72%      A/P  Acute hypoxemic respiratory failure secondary to volume overload ESRD with dialysis noncompliance due to homelessness Acute on chronic HFpEF - Patient's respiratory failure secondary to missed hemodialysis -He refused BiPAP this morning  Last echo done 10/23/2023 showing EF 60 to 65%, grade 2 diastolic dysfunction, left atrium moderately dilated, and mild mitral regurgitation. proBNP >35,000.  Chest x-ray showing vascular congestion and small to moderate bilateral pleural effusions.   Nephrology consulted and will arrange for dialysis today.    Continue home torsemide .   Continue supplemental oxygen , wean as tolerated.    Chest pain Initial EKG showing new T wave inversions in inferior leads which appear improved on repeat EKG.  Troponin 148> 147, not consistent with ACS.    Echocardiogram ordered to assess for wall motion abnormalities. -Symptom likely in setting of missed hemodialysis with worsening pulmonary edema and vascular congestion   Hepatic cirrhosis Noted to have a tense distended abdomen with normal bowel sounds and no tenderness to palpation.  Patient  denies abdominal pain, nausea, or vomiting.  She is having bowel movements and last one was yesterday.  Bowel obstruction less likely.  Suspect ascites but no fever or leukocytosis to suggest SBP at this time.  Ultrasound ordered for further evaluation.  Dialysis as above, may need paracentesis.   Chronic anemia Hemoglobin currently 7.2 and baseline appears to be in the 8-9 range.  Patient is not endorsing any symptoms of GI bleed or any other bleeding.  Repeat labs to confirm hemoglobin level and pursue further workup if there is worsening anemia on repeat labs.  Type and screen.   Hyponatremia Mild hyperkalemia Hypocalcemia Chronic mild metabolic acidosis Give Lokelma  for mild hyperkalemia and dialysis as above.  Monitor labs.  Continue home bicarb supplement.   COPD Patient does have bilateral rhonchi.   Placed on Breztri  which is for hospital formulary replacement for her home Trelegy.  DuoNeb PRN.   Hypertension Continue amlodipine  and Coreg .   Hyperlipidemia Continue Lipitor.   GERD Continue Protonix .   Anxiety/depression Continue BuSpar .   Chronic pain/neuropathy Continue gabapentin .   Homelessness TOC consulted.   Sabas GORMAN [facility]  Hospitalist

## 2024-03-24 NOTE — Progress Notes (Signed)
 RT placed patient back on Bipap per RN and patient request.

## 2024-03-24 NOTE — ED Notes (Addendum)
 Pt yelling at staff that she had a room and we took it away, she's starving and hasn't eaten for 24 hrs. Pt provided with sandwich and ginger ale per regular diet order. Pt setting on end of stretcher, refuses to sit back at this time despite education for pt safety. Pt placed on bedside monitor, pt refuses to keep monitor on.

## 2024-03-24 NOTE — Progress Notes (Signed)
 Patient placed back on Bipap. RN at bedside.

## 2024-03-24 NOTE — Consult Note (Signed)
 Renal Service Consult Note [facility] Lamar JONETTA Fret, MD  Patient: [name] Date: 03/24/2024 Requesting Physician: Dr. Drusilla  Reason for Consult: ESRD pt w/  HPI: The patient is a 64 y.o. year-old w/ PMH of COPD, CHF, ESRD not doing regular dialysis, who presented to ED yesterday saying she has not had dialysis in 2 weeks and is short of breath having chest pain and productive cough.  No fevers.  Some leg swelling.  Ran out of her meds as well.  In the ED BP 148/78, HR 83, RR 11, temp 97.4.  100% sats on 4 L nasal cannula, 72% sat on room air.  Sodium 128 3, potassium 5.3, BUN 93, creatinine 5.8, pro BNP 35K, troponin 148, hemoglobin 7.0, WBC 7.8.  Chest x-ray shows bilateral pleural effusions mild, and some bibasilar opacities consistent with atelectasis.  Patient received albuterol , DuoNeb, Percocet, Lokelma .  Earlier this morning she was tried on BiPAP.  We are asked to see for dialysis.   Pt seen in ED room.  Patient is in tears.  She has no family to take care of her.  Her ex-husband has allowed her to stay with him for a while but recently said she has to get out.  She says she has money for a motel.  I told her she probably needs SNF placement, if she qualifies.   ROS - denies CP, no joint pain, no HA, no blurry vision, no rash, no diarrhea, no nausea/ vomiting   Past Medical History  Past Medical History:  Diagnosis Date   Acute on chronic blood loss anemia 08/01/2022   Acute on chronic diastolic CHF (congestive heart failure) (HCC) 08/12/2022   Acute on chronic respiratory failure with hypoxia (HCC) 05/16/2023   Acute pulmonary edema (HCC) 05/16/2023   Acute seasonal allergic rhinitis due to pollen 02/22/2016   Alcohol  abuse    Allergy    Anxiety    Arteriovenous fistula infection, initial encounter 04/16/2023   Arthritis    Asthma    Cannabis abuse    Cocaine abuse (HCC)    COPD (chronic obstructive pulmonary disease) (HCC)    COPD with acute  exacerbation (HCC) 10/25/2022   COVID 05/16/2023   Depression    Drug addiction (HCC)    GERD (gastroesophageal reflux disease)    GIB (gastrointestinal bleeding) 08/19/2019   Heart murmur    Heme positive stool 08/14/2022   Homelessness    Hx of adenomatous colonic polyps    Hyperlipidemia    Hypertension    pt stated every once in a while BP will be high but has not been prescribed medication for HTN.    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) 02/22/2016   SIRS (systemic inflammatory response syndrome) (HCC) 10/25/2022   Past Surgical History  Past Surgical History:  Procedure Laterality Date   A/V FISTULAGRAM Right 01/31/2023   Procedure: A/V Fistulagram;  Surgeon: Gretta Lonni PARAS, MD;  Location: [facility] INVASIVE CV LAB;  Service: Cardiovascular;  Laterality: Right;   AV FISTULA PLACEMENT Right 08/14/2022   Procedure: RIGHT ARM BRACHIOBASILIC ATERIOVENOUS FISTULA CREATION;  Surgeon: Magda Debby SAILOR, MD;  Location: [facility] OR;  Service: Vascular;  Laterality: Right;   BASCILIC VEIN  TRANSPOSITION Right 11/13/2022   Procedure: RIGHT ARM SECOND STAGE BASILIC VEIN TRANSPOSITION;  Surgeon: Magda Debby SAILOR, MD;  Location: [facility] OR;  Service: Vascular;  Laterality: Right;   BIOPSY  01/01/2019   Procedure: BIOPSY;  Surgeon: Albertus Gordy HERO, MD;  Location: [facility] ENDOSCOPY;  Service: Endoscopy;;   BIOPSY  11/17/2021   Procedure: BIOPSY;  Surgeon: Charlanne Groom, MD;  Location: [facility];  Service: Gastroenterology;;   CESAREAN SECTION  1989   COLONOSCOPY  11/07/2020   2018   CYSTOSCOPY W/ URETERAL STENT PLACEMENT Left 08/01/2018   Procedure: CYSTOSCOPY WITH RETROGRADE PYELOGRAM/URETERAL STENT PLACEMENT;  Surgeon: Carolee Sherwood JONETTA DOUGLAS,  MD;  Location: [facility] ORS;  Service: Urology;  Laterality: Left;   CYSTOSCOPY WITH RETROGRADE PYELOGRAM, URETEROSCOPY AND STENT PLACEMENT Left 01/15/2019   Procedure: CYSTOSCOPY WITH RETROGRADE PYELOGRAM, URETEROSCOPY AND STENT PLACEMENT;  Surgeon: Carolee Sherwood JONETTA DOUGLAS, MD;  Location: [facility] ORS;  Service: Urology;  Laterality: Left;   ENTEROSCOPY N/A 08/16/2022   Procedure: ENTEROSCOPY;  Surgeon: Wilhelmenia Aloha Raddle., MD;  Location: [facility] ENDOSCOPY;  Service: Gastroenterology;  Laterality: N/A;   ENTEROSCOPY N/A 10/31/2022   Procedure: ENTEROSCOPY;  Surgeon: Albertus Gordy HERO, MD;  Location: [facility] ENDOSCOPY;  Service: Gastroenterology;  Laterality: N/A;   ENTEROSCOPY N/A 05/09/2023   Procedure: ENTEROSCOPY;  Surgeon: Federico Rosario BROCKS, MD;  Location: [facility] ENDOSCOPY;  Service: Gastroenterology;  Laterality: N/A;   ENTEROSCOPY N/A 09/09/2023   Procedure: ENTEROSCOPY;  Surgeon: Federico Rosario BROCKS, MD;  Location: [facility] ENDOSCOPY;  Service: Gastroenterology;  Laterality: N/A;   ENTEROSCOPY N/A 10/26/2023   Procedure: ENTEROSCOPY;  Surgeon: Albertus Gordy HERO, MD;  Location: [facility] ENDOSCOPY;  Service: Gastroenterology;  Laterality: N/A;   ESOPHAGOGASTRODUODENOSCOPY (EGD) WITH PROPOFOL  N/A 01/01/2019   Procedure: ESOPHAGOGASTRODUODENOSCOPY (EGD) WITH PROPOFOL ;  Surgeon: Albertus Gordy HERO, MD;  Location: [facility] ENDOSCOPY;  Service: Endoscopy;  Laterality: N/A;   ESOPHAGOGASTRODUODENOSCOPY (EGD) WITH PROPOFOL  N/A 08/21/2019   Procedure: ESOPHAGOGASTRODUODENOSCOPY (EGD) WITH PROPOFOL ;  Surgeon: Shila Gustav GAILS, MD;  Location: [facility] ENDOSCOPY;  Service: Endoscopy;  Laterality: N/A;   ESOPHAGOGASTRODUODENOSCOPY (EGD) WITH PROPOFOL  N/A 11/17/2021   Procedure: ESOPHAGOGASTRODUODENOSCOPY (EGD) WITH PROPOFOL ;  Surgeon: Charlanne Groom, MD;  Location: [facility] ENDOSCOPY;  Service: Gastroenterology;  Laterality: N/A;   FRACTURE SURGERY Left 2011   arm   HEMOSTASIS CLIP PLACEMENT  08/16/2022   Procedure: HEMOSTASIS CLIP PLACEMENT;  Surgeon: Wilhelmenia Aloha Raddle., MD;   Location: [facility] ENDOSCOPY;  Service: Gastroenterology;;   HOT HEMOSTASIS N/A 08/16/2022   Procedure: HOT HEMOSTASIS (ARGON PLASMA COAGULATION/BICAP);  Surgeon: Wilhelmenia Aloha Raddle., MD;  Location: [facility] ENDOSCOPY;  Service: Gastroenterology;  Laterality: N/A;   HOT HEMOSTASIS N/A 10/31/2022   Procedure: HOT HEMOSTASIS (ARGON PLASMA COAGULATION/BICAP);  Surgeon: Albertus Gordy HERO, MD;  Location: [facility] ENDOSCOPY;  Service: Gastroenterology;  Laterality: N/A;   HOT HEMOSTASIS N/A 05/09/2023   Procedure: HOT HEMOSTASIS (ARGON PLASMA COAGULATION/BICAP);  Surgeon: Federico Rosario BROCKS, MD;  Location: [facility] ENDOSCOPY;  Service: Gastroenterology;  Laterality: N/A;   HOT HEMOSTASIS N/A 09/09/2023   Procedure: EGD, WITH ARGON PLASMA COAGULATION;  Surgeon: Federico Rosario BROCKS, MD;  Location: [facility] ENDOSCOPY;  Service: Gastroenterology;  Laterality: N/A;   INSERTION OF DIALYSIS CATHETER Right 08/14/2022   Procedure: INSERTION OF TUNNELED DIALYSIS CATHETER USING PALINDROME CATHETER KIT 19CM;  Surgeon: Magda Debby SAILOR, MD;  Location: [facility] OR;  Service: Vascular;  Laterality: Right;   IR PARACENTESIS  01/02/2024   IR THORACENTESIS ASP PLEURAL SPACE W/IMG GUIDE  09/15/2023   POLYPECTOMY  08/16/2022   Procedure: POLYPECTOMY;  Surgeon:  Mansouraty, Aloha Raddle., MD;  Location: [facility] ENDOSCOPY;  Service: Gastroenterology;;   SUBMUCOSAL TATTOO INJECTION  08/16/2022   Procedure: SUBMUCOSAL TATTOO INJECTION;  Surgeon: Wilhelmenia Aloha Raddle., MD;  Location: [facility] ENDOSCOPY;  Service: Gastroenterology;;   UPPER GASTROINTESTINAL ENDOSCOPY     Family History  Family History  Problem Relation Age of Onset   Diabetes Father    Colon cancer Neg Hx    Esophageal cancer Neg Hx    Rectal cancer Neg Hx    Stomach cancer Neg Hx    Social History  reports that she has been smoking cigarettes. She has been exposed to tobacco smoke. She has never used smokeless tobacco. She reports that she does not currently use alcohol . She reports current drug use. Drug:  Marijuana. Allergies Allergies[1] Home medications Prior to Admission medications  Medication Sig Start Date End Date Taking? Authorizing Provider  acetaminophen  (TYLENOL ) 325 MG tablet Take 2 tablets (650 mg total) by mouth every 6 (six) hours as needed for mild pain (pain score 1-3). 02/04/24   Hoy Nidia FALCON, PA-C  albuterol  (VENTOLIN  HFA) 108 (90 Base) MCG/ACT inhaler Inhale 2 puffs into the lungs every 6 (six) hours as needed for wheezing or shortness of breath. 03/13/24   Arrien, Elidia Sieving, MD  amLODipine  (NORVASC ) 10 MG tablet Take 1 tablet (10 mg total) by mouth daily. 03/13/24   Arrien, Elidia Sieving, MD  atorvastatin  (LIPITOR) 10 MG tablet Take 1 tablet (10 mg total) by mouth daily. 03/13/24   Arrien, Elidia Sieving, MD  busPIRone  (BUSPAR ) 5 MG tablet Take 1 tablet (5 mg total) by mouth 3 (three) times daily. 03/13/24   Arrien, Elidia Sieving, MD  calcitRIOL  (ROCALTROL ) 0.25 MCG capsule Take 1 capsule (0.25 mcg total) by mouth daily. 03/13/24   Arrien, Mauricio Daniel, MD  carvedilol  (COREG ) 6.25 MG tablet Take 1 tablet (6.25 mg total) by mouth 2 (two) times daily with a meal. 03/13/24   Arrien, Elidia Sieving, MD  gabapentin  (NEURONTIN ) 100 MG capsule Take 1 capsule (100 mg total) by mouth 3 (three) times daily. 03/13/24 04/14/24  Arrien, Elidia Sieving, MD  pantoprazole  (PROTONIX ) 40 MG tablet Take 1 tablet (40 mg total) by mouth 2 (two) times daily. 03/13/24 04/14/24  Arrien, Elidia Sieving, MD  sodium bicarbonate  650 MG tablet Take 2 tablets (1,300 mg total) by mouth 2 (two) times daily. 03/13/24   Arrien, Mauricio Daniel, MD  torsemide  (DEMADEX ) 100 MG tablet Take 1 tablet (100 mg total) by mouth daily. 03/13/24 04/14/24  Arrien, Mauricio Daniel, MD  TRELEGY ELLIPTA  100-62.5-25 MCG/ACT AEPB Inhale 1 puff into the lungs daily. 03/13/24   Noralee Elidia Sieving, MD     Vitals:   03/24/24 0630 03/24/24 0645 03/24/24 0655 03/24/24 0800  BP:      Pulse: (!) 118 (!)  28 83 (!) 27  Resp: (!) 26 12 11 16   Temp:      TempSrc:      SpO2: (!) 64% (!) 86% 100% (!) 72%   Exam Gen alert, no distress Sclera anicteric, throat clear  + jvd no bruits Chest bilat rhonchi, some crackles and wheezing Not in distress RRR no MRG Abd firm w/ 2+ ascites which is somewhat tight, +bs Ext 1+ pretib edema bilat Neuro is alert, Ox 3 , nf    RUA aVF+bruit   Home bp meds: Norvasc  Coreg  Demadex    OP HD: from 2024 -->  3h  B350 Heparin  none  RUA AVF   Assessment/ Plan:  # ESRD -  does not have outpt HD unit - last HD about 2 wks ago - plan HD today upstairs   # HTN - ran out of her medications 2 wks ago - resume home meds prn after HD today    # Volume - overloaded w/ vasc congestion on CXR, LE edema, hypoxia and ascites - max UF w/ HD today   # Hyponatremia - due to vol overload, should improve w/ HD  # Anemia of esrd - Hb 7- 9 here, follow, transfuse prn   # COPD  # Ascites - related to vol overload in esrd patient   Cynthia Fret  MD [facility] 03/24/2024, 8:18 AM  Recent Labs  Lab 03/23/24 1850 03/24/24 0637  HGB 7.2* 7.0*  ALBUMIN  3.8  --   CALCIUM  7.7* 7.6*  CREATININE 5.80* 5.88*  K 5.3* 5.2*   Inpatient medications:  amLODipine   10 mg Oral Daily   atorvastatin   10 mg Oral Daily   budesonide -glycopyrrolate -formoterol   2 puff Inhalation BID   busPIRone   5 mg Oral TID   calcitRIOL   0.25 mcg Oral Daily   carvedilol   6.25 mg Oral BID WC   gabapentin   100 mg Oral TID   pantoprazole   40 mg Oral BID   sodium bicarbonate   1,300 mg Oral BID   torsemide   100 mg Oral Daily    acetaminophen  **OR** acetaminophen , ipratropium-albuterol       [1]  Allergies Allergen Reactions   Zestril  [Lisinopril ] Other (See Comments)    Hyperkalemia

## 2024-03-24 NOTE — ED Notes (Signed)
 Pt sat 81%, refusing BiPap and cussing staff out. MD notified

## 2024-03-24 NOTE — H&P (Signed)
 " History and Physical    [name] FMW:[id number] DOB: 1960-05-10 DOA: 03/23/2024  PCP: Pcp, No  Patient coming from: Home  Chief Complaint: Chest pain, shortness of breath  HPI: [name] is a 64 y.o. female with medical history significant of ESRD on HD with history of noncompliance due to homelessness, HFpEF, COPD, polysubstance abuse, hypertension, hyperlipidemia, GERD, anxiety, depression, anemia, hepatic cirrhosis presenting with complaints of chest pain and shortness of breath.  Patient is a poor historian.  She is reporting substernal chest heaviness and shortness of breath for several days.  She was admitted to the hospital last month for volume overload and has not gone for dialysis since she was discharged 2 weeks ago.  She reports taking all of her medications but thinks she will start running out of them soon.  Denies fevers.  She is reporting abdominal distention but denies abdominal pain or vomiting.  She is having bowel movements and last one was yesterday.  ED Course: Tachypneic and hypoxemic to the 70s.  Placed on BiPAP.  Afebrile.  EKG showing normal sinus rhythm and new T wave inversions in inferior leads.  Labs showing no leukocytosis, hemoglobin 7.2, platelet count 459k, sodium 123, potassium 5.3, chloride 83, bicarb 18, glucose 93, calcium  7.7, albumin  3.8, proBNP >35,000, troponin 148> 147, COVID/influenza/RSV PCR negative.  Chest x-ray showing vascular congestion and small to moderate bilateral pleural effusions.  Patient was given albuterol  neb, DuoNeb, and Percocet.  Nephrology consulted and will arrange for dialysis in the morning.  Review of Systems:  Review of Systems  All other systems reviewed and are negative.   Past Medical History:  Diagnosis Date   Acute on chronic blood loss anemia 08/01/2022   Acute on chronic diastolic CHF (congestive heart failure) (HCC) 08/12/2022   Acute on chronic respiratory failure with hypoxia (HCC) 05/16/2023   Acute  pulmonary edema (HCC) 05/16/2023   Acute seasonal allergic rhinitis due to pollen 02/22/2016   Alcohol  abuse    Allergy    Anxiety    Arteriovenous fistula infection, initial encounter 04/16/2023   Arthritis    Asthma    Cannabis abuse    Cocaine abuse (HCC)    COPD (chronic obstructive pulmonary disease) (HCC)    COPD with acute exacerbation (HCC) 10/25/2022   COVID 05/16/2023   Depression    Drug addiction (HCC)    GERD (gastroesophageal reflux disease)    GIB (gastrointestinal bleeding) 08/19/2019   Heart murmur    Heme positive stool 08/14/2022   Homelessness    Hx of adenomatous colonic polyps    Hyperlipidemia    Hypertension    pt stated every once in a while BP will be high but has not been prescribed medication for HTN.    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) 02/22/2016   SIRS (systemic inflammatory response syndrome) (HCC) 10/25/2022    Past Surgical History:  Procedure Laterality Date   A/V FISTULAGRAM Right 01/31/2023   Procedure: A/V Fistulagram;  Surgeon: Gretta Lonni PARAS, MD;  Location: [facility];  Service: Cardiovascular;  Laterality: Right;   AV FISTULA PLACEMENT Right 08/14/2022   Procedure: RIGHT ARM BRACHIOBASILIC ATERIOVENOUS FISTULA CREATION;  Surgeon: Magda Debby SAILOR,  MD;  Location: [facility] OR;  Service: Vascular;  Laterality: Right;   BASCILIC VEIN TRANSPOSITION Right 11/13/2022   Procedure: RIGHT ARM SECOND STAGE BASILIC VEIN TRANSPOSITION;  Surgeon: Magda Debby SAILOR, MD;  Location: [facility] OR;  Service: Vascular;  Laterality: Right;   BIOPSY  01/01/2019   Procedure: BIOPSY;  Surgeon: Albertus Gordy HERO, MD;  Location: [facility] ENDOSCOPY;  Service: Endoscopy;;   BIOPSY  11/17/2021    Procedure: BIOPSY;  Surgeon: Charlanne Groom, MD;  Location: [facility] ENDOSCOPY;  Service: Gastroenterology;;   CESAREAN SECTION  1989   COLONOSCOPY  11/07/2020   2018   CYSTOSCOPY W/ URETERAL STENT PLACEMENT Left 08/01/2018   Procedure: CYSTOSCOPY WITH RETROGRADE PYELOGRAM/URETERAL STENT PLACEMENT;  Surgeon: Carolee Sherwood JONETTA DOUGLAS, MD;  Location: [facility] ORS;  Service: Urology;  Laterality: Left;   CYSTOSCOPY WITH RETROGRADE PYELOGRAM, URETEROSCOPY AND STENT PLACEMENT Left 01/15/2019   Procedure: CYSTOSCOPY WITH RETROGRADE PYELOGRAM, URETEROSCOPY AND STENT PLACEMENT;  Surgeon: Carolee Sherwood JONETTA DOUGLAS, MD;  Location: [facility] ORS;  Service: Urology;  Laterality: Left;   ENTEROSCOPY N/A 08/16/2022   Procedure: ENTEROSCOPY;  Surgeon: Wilhelmenia Aloha Raddle., MD;  Location: [facility] ENDOSCOPY;  Service: Gastroenterology;  Laterality: N/A;   ENTEROSCOPY N/A 10/31/2022   Procedure: ENTEROSCOPY;  Surgeon: Albertus Gordy HERO, MD;  Location: [facility] ENDOSCOPY;  Service: Gastroenterology;  Laterality: N/A;   ENTEROSCOPY N/A 05/09/2023   Procedure: ENTEROSCOPY;  Surgeon: Federico Rosario BROCKS, MD;  Location: [facility] ENDOSCOPY;  Service: Gastroenterology;  Laterality: N/A;   ENTEROSCOPY N/A 09/09/2023   Procedure: ENTEROSCOPY;  Surgeon: Federico Rosario BROCKS, MD;  Location: [facility] ENDOSCOPY;  Service: Gastroenterology;  Laterality: N/A;   ENTEROSCOPY N/A 10/26/2023   Procedure: ENTEROSCOPY;  Surgeon: Albertus Gordy HERO, MD;  Location: [facility] ENDOSCOPY;  Service: Gastroenterology;  Laterality: N/A;   ESOPHAGOGASTRODUODENOSCOPY (EGD) WITH PROPOFOL  N/A 01/01/2019   Procedure: ESOPHAGOGASTRODUODENOSCOPY (EGD) WITH PROPOFOL ;  Surgeon: Albertus Gordy HERO, MD;  Location: [facility] ENDOSCOPY;  Service: Endoscopy;  Laterality: N/A;   ESOPHAGOGASTRODUODENOSCOPY (EGD) WITH PROPOFOL  N/A 08/21/2019   Procedure: ESOPHAGOGASTRODUODENOSCOPY (EGD) WITH PROPOFOL ;  Surgeon: Shila Gustav GAILS, MD;  Location: [facility] ENDOSCOPY;  Service: Endoscopy;  Laterality: N/A;   ESOPHAGOGASTRODUODENOSCOPY (EGD) WITH PROPOFOL  N/A  11/17/2021   Procedure: ESOPHAGOGASTRODUODENOSCOPY (EGD) WITH PROPOFOL ;  Surgeon: Charlanne Groom, MD;  Location: [facility] ENDOSCOPY;  Service: Gastroenterology;  Laterality: N/A;   FRACTURE SURGERY Left 2011   arm   HEMOSTASIS CLIP PLACEMENT  08/16/2022   Procedure: HEMOSTASIS CLIP PLACEMENT;  Surgeon: Wilhelmenia Aloha Raddle., MD;  Location: [facility] ENDOSCOPY;  Service: Gastroenterology;;   HOT HEMOSTASIS N/A 08/16/2022   Procedure: HOT HEMOSTASIS (ARGON PLASMA COAGULATION/BICAP);  Surgeon: Wilhelmenia Aloha Raddle., MD;  Location: [facility] ENDOSCOPY;  Service: Gastroenterology;  Laterality: N/A;   HOT HEMOSTASIS N/A 10/31/2022   Procedure: HOT HEMOSTASIS (ARGON PLASMA COAGULATION/BICAP);  Surgeon: Albertus Gordy HERO, MD;  Location: [facility] ENDOSCOPY;  Service: Gastroenterology;  Laterality: N/A;   HOT HEMOSTASIS N/A 05/09/2023   Procedure: HOT HEMOSTASIS (ARGON PLASMA COAGULATION/BICAP);  Surgeon: Federico Rosario BROCKS, MD;  Location: [facility] ENDOSCOPY;  Service: Gastroenterology;  Laterality: N/A;   HOT HEMOSTASIS N/A 09/09/2023   Procedure: EGD, WITH ARGON PLASMA COAGULATION;  Surgeon: Federico Rosario BROCKS, MD;  Location: [facility] ENDOSCOPY;  Service: Gastroenterology;  Laterality: N/A;   INSERTION OF DIALYSIS CATHETER Right 08/14/2022   Procedure: INSERTION OF TUNNELED DIALYSIS CATHETER USING PALINDROME CATHETER KIT 19CM;  Surgeon: Magda Debby SAILOR, MD;  Location: [facility] OR;  Service: Vascular;  Laterality: Right;   IR PARACENTESIS  01/02/2024   IR THORACENTESIS ASP PLEURAL SPACE  W/IMG GUIDE  09/15/2023   POLYPECTOMY  08/16/2022   Procedure: POLYPECTOMY;  Surgeon: Mansouraty, Aloha Raddle., MD;  Location: [facility] ENDOSCOPY;  Service: Gastroenterology;;   SUBMUCOSAL TATTOO INJECTION  08/16/2022   Procedure: SUBMUCOSAL TATTOO INJECTION;  Surgeon: Wilhelmenia Aloha Raddle., MD;  Location: [facility] ENDOSCOPY;  Service: Gastroenterology;;   UPPER GASTROINTESTINAL ENDOSCOPY       reports that she has been smoking cigarettes. She has been exposed to tobacco smoke. She has  never used smokeless tobacco. She reports that she does not currently use alcohol . She reports current drug use. Drug: Marijuana.  Allergies[1]  Family History  Problem Relation Age of Onset   Diabetes Father    Colon cancer Neg Hx    Esophageal cancer Neg Hx    Rectal cancer Neg Hx    Stomach cancer Neg Hx     Prior to Admission medications  Medication Sig Start Date End Date Taking? Authorizing Provider  acetaminophen  (TYLENOL ) 325 MG tablet Take 2 tablets (650 mg total) by mouth every 6 (six) hours as needed for mild pain (pain score 1-3). 02/04/24   Hoy Nidia FALCON, PA-C  albuterol  (VENTOLIN  HFA) 108 (90 Base) MCG/ACT inhaler Inhale 2 puffs into the lungs every 6 (six) hours as needed for wheezing or shortness of breath. 03/13/24   Arrien, Elidia Sieving, MD  amLODipine  (NORVASC ) 10 MG tablet Take 1 tablet (10 mg total) by mouth daily. 03/13/24   Arrien, Elidia Sieving, MD  atorvastatin  (LIPITOR) 10 MG tablet Take 1 tablet (10 mg total) by mouth daily. 03/13/24   Arrien, Elidia Sieving, MD  busPIRone  (BUSPAR ) 5 MG tablet Take 1 tablet (5 mg total) by mouth 3 (three) times daily. 03/13/24   Arrien, Mauricio Daniel, MD  calcitRIOL  (ROCALTROL ) 0.25 MCG capsule Take 1 capsule (0.25 mcg total) by mouth daily. 03/13/24   Arrien, Mauricio Daniel, MD  carvedilol  (COREG ) 6.25 MG tablet Take 1 tablet (6.25 mg total) by mouth 2 (two) times daily with a meal. 03/13/24   Arrien, Elidia Sieving, MD  gabapentin  (NEURONTIN ) 100 MG capsule Take 1 capsule (100 mg total) by mouth 3 (three) times daily. 03/13/24 04/14/24  Arrien, Elidia Sieving, MD  pantoprazole  (PROTONIX ) 40 MG tablet Take 1 tablet (40 mg total) by mouth 2 (two) times daily. 03/13/24 04/14/24  Arrien, Elidia Sieving, MD  sodium bicarbonate  650 MG tablet Take 2 tablets (1,300 mg total) by mouth 2 (two) times daily. 03/13/24   Arrien, Mauricio Daniel, MD  torsemide  (DEMADEX ) 100 MG tablet Take 1 tablet (100 mg total) by mouth daily.  03/13/24 04/14/24  Arrien, Mauricio Daniel, MD  TRELEGY ELLIPTA  100-62.5-25 MCG/ACT AEPB Inhale 1 puff into the lungs daily. 03/13/24   Noralee Elidia Sieving, MD    Physical Exam: Vitals:   03/24/24 0207 03/24/24 0300 03/24/24 0423 03/24/24 0447  BP: (!) 156/105 91/66  (!) 148/78  Pulse: 90 81 81 82  Resp: (!) 24 11  (!) 24  Temp:      TempSrc:      SpO2: 100% 100%  100%    Physical Exam Vitals reviewed.  Constitutional:      General: She is not in acute distress. HENT:     Head: Normocephalic and atraumatic.  Eyes:     Extraocular Movements: Extraocular movements intact.  Cardiovascular:     Rate and Rhythm: Normal rate and regular rhythm.     Pulses: Normal pulses.  Pulmonary:     Effort: Pulmonary effort is normal. No respiratory distress.  Breath sounds: Rhonchi and rales present.  Abdominal:     General: Bowel sounds are normal. There is distension.     Palpations: Abdomen is soft.     Tenderness: There is no abdominal tenderness. There is no guarding.  Musculoskeletal:     Cervical back: Normal range of motion.     Right lower leg: No edema.     Left lower leg: No edema.  Skin:    General: Skin is warm and dry.  Neurological:     General: No focal deficit present.     Mental Status: She is alert and oriented to person, place, and time.     Labs on Admission: I have personally reviewed following labs and imaging studies  CBC: Recent Labs  Lab 03/23/24 1850  WBC 9.2  NEUTROABS 7.6  HGB 7.2*  HCT 22.7*  MCV 102.3*  PLT 459*   Basic Metabolic Panel: Recent Labs  Lab 03/23/24 1850  NA 123*  K 5.3*  CL 83*  CO2 18*  GLUCOSE 93  BUN 93*  CREATININE 5.80*  CALCIUM  7.7*   GFR: Estimated Creatinine Clearance: 9.1 mL/min (A) (by C-G formula based on SCr of 5.8 mg/dL (H)). Liver Function Tests: Recent Labs  Lab 03/23/24 1850  AST 17  ALT 31  ALKPHOS 138*  BILITOT 0.4  PROT 7.2  ALBUMIN  3.8   No results for input(s): LIPASE, AMYLASE  in the last 168 hours. No results for input(s): AMMONIA in the last 168 hours. Coagulation Profile: No results for input(s): INR, PROTIME in the last 168 hours. Cardiac Enzymes: No results for input(s): CKTOTAL, CKMB, CKMBINDEX, TROPONINI in the last 168 hours. BNP (last 3 results) Recent Labs    03/10/24 0055 03/13/24 2100 03/23/24 1850  PROBNP >35,000.0* >35,000.0* >35,000.0*   HbA1C: No results for input(s): HGBA1C in the last 72 hours. CBG: No results for input(s): GLUCAP in the last 168 hours. Lipid Profile: No results for input(s): CHOL, HDL, LDLCALC, TRIG, CHOLHDL, LDLDIRECT in the last 72 hours. Thyroid Function Tests: No results for input(s): TSH, T4TOTAL, FREET4, T3FREE, THYROIDAB in the last 72 hours. Anemia Panel: No results for input(s): VITAMINB12, FOLATE, FERRITIN, TIBC, IRON , RETICCTPCT in the last 72 hours. Urine analysis:    Component Value Date/Time   COLORURINE YELLOW 12/30/2023 1305   APPEARANCEUR CLOUDY (A) 12/30/2023 1305   LABSPEC 1.011 12/30/2023 1305   PHURINE 6.0 12/30/2023 1305   GLUCOSEU 50 (A) 12/30/2023 1305   HGBUR SMALL (A) 12/30/2023 1305   BILIRUBINUR NEGATIVE 12/30/2023 1305   KETONESUR NEGATIVE 12/30/2023 1305   PROTEINUR 100 (A) 12/30/2023 1305   UROBILINOGEN 0.2 03/02/2010 2027   NITRITE NEGATIVE 12/30/2023 1305   LEUKOCYTESUR LARGE (A) 12/30/2023 1305    Radiological Exams on Admission: DG Chest 2 View Result Date: 03/23/2024 EXAM: 2 VIEW(S) XRAY OF THE CHEST 03/23/2024 07:11:00 PM COMPARISON: 03/13/2024 CLINICAL HISTORY: shob shob FINDINGS: LUNGS AND PLEURA: Bibasilar opacities, favor atelectasis. Small to moderate bilateral pleural effusions, left larger than right. No pneumothorax. HEART AND MEDIASTINUM: Cardiomegaly, vascular congestion. BONES AND SOFT TISSUES: No acute osseous abnormality. IMPRESSION: 1. Vascular congestion with small to moderate bilateral pleural effusions, left  larger than right. 2. Cardiomegaly. 3. Bibasilar opacities, favor atelectasis. Electronically signed by: Franky Crease MD 03/23/2024 07:26 PM EST RP Workstation: [id number]    Assessment and Plan  Acute hypoxemic respiratory failure secondary to volume overload ESRD with dialysis noncompliance due to homelessness Acute on chronic HFpEF Oxygen  saturation in the 70s on room air and  initially required BiPAP in the ED due to increased work of breathing.  Now weaned down to 4 L Vera and stable.  She has not gone for dialysis since her hospital discharge 2 weeks ago. Last echo done 10/23/2023 showing EF 60 to 65%, grade 2 diastolic dysfunction, left atrium moderately dilated, and mild mitral regurgitation. proBNP >35,000. Chest x-ray showing vascular congestion and small to moderate bilateral pleural effusions.  Nephrology consulted and will arrange for dialysis in the morning.  Continue home torsemide .  Continue supplemental oxygen , wean as tolerated.  Chest pain Initial EKG showing new T wave inversions in inferior leads which appear improved on repeat EKG.  Troponin 148> 147, not consistent with ACS.  Patient reports improvement of her dyspnea and no longer having active chest pain at this time.  Echocardiogram ordered to assess for wall motion abnormalities.  Hepatic cirrhosis Noted to have a tense distended abdomen with normal bowel sounds and no tenderness to palpation.  Patient denies abdominal pain, nausea, or vomiting.  She is having bowel movements and last one was yesterday.  Bowel obstruction less likely.  Suspect ascites but no fever or leukocytosis to suggest SBP at this time.  Ultrasound ordered for further evaluation.  Dialysis as above, may need paracentesis.  Chronic anemia Hemoglobin currently 7.2 and baseline appears to be in the 8-9 range.  Patient is not endorsing any symptoms of GI bleed or any other bleeding.  Repeat labs to confirm hemoglobin level and pursue further workup if there is  worsening anemia on repeat labs.  Type and screen.  Hyponatremia Mild hyperkalemia Hypocalcemia Chronic mild metabolic acidosis Give Lokelma  for mild hyperkalemia and dialysis as above.  Monitor labs.  Continue home bicarb supplement.  COPD No wheezing.  Placed on Breztri  which is for hospital formulary replacement for her home Trelegy.  DuoNeb PRN.  Hypertension Continue amlodipine  and Coreg .  Hyperlipidemia Continue Lipitor.  GERD Continue Protonix .  Anxiety/depression Continue BuSpar .  Chronic pain/neuropathy Continue gabapentin .  Homelessness TOC consulted.  DVT prophylaxis: SCDs Code Status: DNR/DNI (discussed with the patient) Level of care: Progressive Care Unit Admission status: It is my clinical opinion that admission to INPATIENT is reasonable and necessary because of the expectation that this patient will require hospital care that crosses at least 2 midnights to treat this condition based on the medical complexity of the problems presented.  Given the aforementioned information, the predictability of an adverse outcome is felt to be significant.  Editha Ram MD [facility]  If 7PM-7AM, please contact night-coverage [email or web address]  03/24/2024, 5:12 AM       [1]  Allergies Allergen Reactions   Zestril  [Lisinopril ] Other (See Comments)    Hyperkalemia   "

## 2024-03-24 NOTE — Progress Notes (Signed)
 Patient was transported on Bipap from the ED to dialysis with no problems.

## 2024-03-24 NOTE — ED Notes (Signed)
 Pt removed Bipap stating, I am tired of wearing this. I don't need it anymore. Pt placed back on oxygen  and educated on importance of Bipap, pt still refused and becoming agitated. Pt oxygen  100% on [employer]

## 2024-03-24 NOTE — Progress Notes (Signed)
" ° °  Brief Progress Note   _____________________________________________________________________________________________________________  Patient Name: [name] Patient DOB: Jan 24, 1961 Date: @TODAY @      Data: 64 y.o. female currently awaiting admission to a Progressive bed at [facility].      Action: Reviewed the patient's vital signs, lab results, and clinical notes to assess if patient appropriate to level-load from [facility] to [facility] for admission.     Response:  Pt with ESRD and on HD. Patient will remain at [facility], awaiting bed assignment.   _____________________________________________________________________________________________________________  [facility] RN Expeditor Owenn Rothermel S Lunabella Badgett Please contact us  directly via secure chat (search for [facility]) or by calling us  at [phone] [facility]).  "

## 2024-03-24 NOTE — ED Notes (Signed)
 Pt transported to dialysis with RN and RT.

## 2024-03-24 NOTE — Progress Notes (Signed)
 Patient pulled the bipap mask off and wants to eat. Patient refuses to put bipap mask back on at this time. RT placed Bipap on standby and informed RN.

## 2024-03-25 ENCOUNTER — Inpatient Hospital Stay (HOSPITAL_COMMUNITY)

## 2024-03-25 DIAGNOSIS — J9601 Acute respiratory failure with hypoxia: Secondary | ICD-10-CM

## 2024-03-25 DIAGNOSIS — I5033 Acute on chronic diastolic (congestive) heart failure: Secondary | ICD-10-CM

## 2024-03-25 DIAGNOSIS — E8779 Other fluid overload: Secondary | ICD-10-CM

## 2024-03-25 DIAGNOSIS — E875 Hyperkalemia: Secondary | ICD-10-CM

## 2024-03-25 DIAGNOSIS — J441 Chronic obstructive pulmonary disease with (acute) exacerbation: Secondary | ICD-10-CM

## 2024-03-25 DIAGNOSIS — R7989 Other specified abnormal findings of blood chemistry: Secondary | ICD-10-CM

## 2024-03-25 LAB — TYPE AND SCREEN
ABO/RH(D): A POS
Antibody Screen: NEGATIVE
Unit division: 0

## 2024-03-25 LAB — BASIC METABOLIC PANEL WITH GFR
Anion gap: 14 (ref 5–15)
BUN: 53 mg/dL — ABNORMAL HIGH (ref 8–23)
CO2: 25 mmol/L (ref 22–32)
Calcium: 7.8 mg/dL — ABNORMAL LOW (ref 8.9–10.3)
Chloride: 87 mmol/L — ABNORMAL LOW (ref 98–111)
Creatinine, Ser: 3.88 mg/dL — ABNORMAL HIGH (ref 0.44–1.00)
GFR, Estimated: 12 mL/min — ABNORMAL LOW
Glucose, Bld: 98 mg/dL (ref 70–99)
Potassium: 4.4 mmol/L (ref 3.5–5.1)
Sodium: 127 mmol/L — ABNORMAL LOW (ref 135–145)

## 2024-03-25 LAB — BPAM RBC
Blood Product Expiration Date: 202601302359
ISSUE DATE / TIME: 202601071732
Unit Type and Rh: 6200

## 2024-03-25 LAB — CBC
HCT: 25.3 % — ABNORMAL LOW (ref 36.0–46.0)
Hemoglobin: 8.1 g/dL — ABNORMAL LOW (ref 12.0–15.0)
MCH: 31.2 pg (ref 26.0–34.0)
MCHC: 32 g/dL (ref 30.0–36.0)
MCV: 97.3 fL (ref 80.0–100.0)
Platelets: 375 K/uL (ref 150–400)
RBC: 2.6 MIL/uL — ABNORMAL LOW (ref 3.87–5.11)
RDW: 19.9 % — ABNORMAL HIGH (ref 11.5–15.5)
WBC: 7.8 K/uL (ref 4.0–10.5)
nRBC: 0 % (ref 0.0–0.2)

## 2024-03-25 LAB — ECHOCARDIOGRAM COMPLETE
Area-P 1/2: 3.91 cm2
S' Lateral: 3.45 cm
Weight: 2476.21 [oz_av]

## 2024-03-25 LAB — HEPATITIS B SURFACE ANTIGEN: Hepatitis B Surface Ag: NONREACTIVE

## 2024-03-25 MED ORDER — HALOPERIDOL LACTATE 5 MG/ML IJ SOLN
5.0000 mg | Freq: Once | INTRAMUSCULAR | Status: AC | PRN
  Administered 2024-03-25: 5 mg via INTRAVENOUS
  Filled 2024-03-25: qty 1

## 2024-03-25 MED ORDER — CHLORHEXIDINE GLUCONATE CLOTH 2 % EX PADS
6.0000 | MEDICATED_PAD | Freq: Every day | CUTANEOUS | Status: DC
  Administered 2024-03-26 – 2024-03-27 (×2): 6 via TOPICAL

## 2024-03-25 MED ORDER — HEPARIN SODIUM (PORCINE) 5000 UNIT/ML IJ SOLN
5000.0000 [IU] | Freq: Three times a day (TID) | INTRAMUSCULAR | Status: DC
  Administered 2024-03-25 – 2024-04-05 (×27): 5000 [IU] via SUBCUTANEOUS
  Filled 2024-03-25 (×27): qty 1

## 2024-03-25 NOTE — ED Notes (Addendum)
 Patient increasingly becoming more agitated and restless. Keeps ripping off monitoring equipment and oxygen , constantly tries to climb out of bed. Patient also becoming confused, repeatedly asking for nighttime meds and for a meal despite reminders that she did receive medications and a meal. Attempts at redirection are unsuccessful, repositioning in bed unsuccessful, and providing snacks and water  unsuccessful at calming patient. Opyd, MD notified of patient behavior.

## 2024-03-25 NOTE — ED Notes (Addendum)
 Cleaned patient up, new linens placed on bed and patient placed in a new gown and provided new warm blankets. Patient removed monitoring equipment, placed back on monitor at this time. Patient refusing to wear pulse ox, states she doesn't need it and she's going to wait until a doctor tells her she needs to keep it on.

## 2024-03-25 NOTE — Progress Notes (Signed)
 Echocardiogram 2D Echocardiogram has been performed.  [name] 03/25/2024, 11:47 AM
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
## 2024-03-25 NOTE — ED Notes (Signed)
 Entered patient's room to assist patient with a bedpan, found patient had ripped out IV and monitoring equipment. Patient had a large bowel movement, new linens placed on bed and patient placed in a new, clean gown. Patient placed back onto monitoring equipment. Patient denies need for new blankets. Bed locked and in lowest position with call bell within reach.

## 2024-03-25 NOTE — ED Notes (Signed)
 Sitter at bedside.

## 2024-03-25 NOTE — Progress Notes (Signed)
 Pt known to navigator. Pt does not have an out-pt HD clinic due to pt not agreeable to out-pt HD and pt has hx of being non-compliant with HD when pt has had clinics in the past. Pt has many social barriers and pt has been d/c from several local HD clinics due to non-compliance. Will assist as needed.   Randine Mungo Dialysis Navigator [phone]

## 2024-03-25 NOTE — Plan of Care (Signed)
   Problem: Education: Goal: Knowledge of General Education information will improve Description: Including pain rating scale, medication(s)/side effects and non-pharmacologic comfort measures Outcome: Progressing   Problem: Safety: Goal: Ability to remain free from injury will improve Outcome: Progressing

## 2024-03-25 NOTE — Progress Notes (Addendum)
 [facility]  PROGRESS NOTE  [name] FMW:[id number] DOB: [birth year] DOA: 03/23/2024 PCP: Pcp, No   Brief HPI:    64 y.o. female with medical history significant of ESRD on HD with history of noncompliance due to homelessness, HFpEF, COPD, polysubstance abuse, hypertension, hyperlipidemia, GERD, anxiety, depression, anemia, hepatic cirrhosis presenting with complaints of chest pain and shortness of breath.  Patient is a poor historian.  She is reporting substernal chest heaviness and shortness of breath for several days.  She was admitted to the hospital last month for volume overload and has not gone for dialysis since she was discharged 2 weeks ago.  She reports taking all of her medications but thinks she will start running out of them soon.        Assessment/Plan:   Acute hypoxemic respiratory failure secondary to volume overload -Significantly improved after hemodialysis ESRD with dialysis noncompliance due to homelessness  Last echo done 10/23/2023 showing EF 60 to 65%, grade 2 diastolic dysfunction, left atrium moderately dilated, and mild mitral regurgitation. proBNP >35,000.  Chest x-ray showing vascular congestion and small to moderate bilateral pleural effusions.   Nephrology consulted; underwent hemodialysis yesterday.  Continue home torsemide .   Continue supplemental oxygen , wean as tolerated.     Chest pain Initial EKG showing new T wave inversions in inferior leads which appear improved on repeat EKG.  Troponin 148> 147, not consistent with ACS.    Echocardiogram ordered to assess for wall motion abnormalities. -Symptom likely in setting of missed hemodialysis with worsening pulmonary edema and vascular congestion   Hepatic cirrhosis Noted to have a tense distended abdomen with normal bowel sounds and no tenderness to palpation.  Patient denies abdominal pain, nausea, or vomiting.  She is having bowel movements and last one was yesterday.  Bowel obstruction less  likely.  Suspect ascites but no fever or leukocytosis to suggest SBP at this time.  Ultrasound ordered for further evaluation.  Dialysis as above, may need paracentesis.   Chronic anemia Hemoglobin currently 7.2 and baseline appears to be in the 8-9 range.  Patient is not endorsing any symptoms of GI bleed or any other bleeding.  Repeat labs to confirm hemoglobin level and pursue further workup if there is worsening anemia on repeat labs.  Type and screen.   Hyponatremia Mild hyperkalemia Hypocalcemia Chronic mild metabolic acidosis Resolved   COPD Patient does have bilateral rhonchi.   Placed on Breztri  which is for hospital formulary replacement for her home Trelegy.  DuoNeb PRN.   Hypertension Continue amlodipine  and Coreg .   Hyperlipidemia Continue Lipitor.   GERD Continue Protonix .   Anxiety/depression Continue BuSpar .   Chronic pain/neuropathy Continue gabapentin .   Homelessness TOC consulted.      DVT prophylaxis: Heparin   Medications     sodium chloride    Intravenous Once   amLODipine   10 mg Oral Daily   atorvastatin   10 mg Oral Daily   budesonide -glycopyrrolate -formoterol   2 puff Inhalation BID   busPIRone   5 mg Oral TID   calcitRIOL   0.25 mcg Oral Daily   carvedilol   6.25 mg Oral BID WC   Chlorhexidine  Gluconate Cloth  6 each Topical Q0600   gabapentin   100 mg Oral TID   pantoprazole   40 mg Oral BID   sodium bicarbonate   1,300 mg Oral BID   torsemide   100 mg Oral Daily     Data Reviewed:   CBG:  No results for input([name]): GLUCAP in the last 168 hours.  SpO2: 95 % O2 Flow  Rate (L/min): 4 L/min FiO2 (%): 40 %    Vitals:   03/25/24 0230 03/25/24 0300 03/25/24 0453 03/25/24 0630  BP: (!) 150/67 (!) 148/88 (!) 183/82 (!) 166/69  Pulse: 83 88 86 88  Resp:   20 (!) 24  Temp:   97.6 F (36.4 C)   TempSrc:   Oral   SpO2: 96% 96% 93% 95%  Weight:          Data Reviewed:  Basic Metabolic Panel: Recent Labs  Lab 03/23/24 1850  03/24/24 0637 03/25/24 0450  NA 123* 125* 127*  K 5.3* 5.2* 4.4  CL 83* 84* 87*  CO2 18* 20* 25  GLUCOSE 93 99 98  BUN 93* 93* 53*  CREATININE 5.80* 5.88* 3.88*  CALCIUM  7.7* 7.6* 7.8*    CBC: Recent Labs  Lab 03/23/24 1850 03/24/24 0637 03/25/24 0450  WBC 9.2 7.8 7.8  NEUTROABS 7.6  --   --   HGB 7.2* 7.0* 8.1*  HCT 22.7* 22.4* 25.3*  MCV 102.3* 101.8* 97.3  PLT 459* 424* 375    LFT Recent Labs  Lab 03/23/24 1850  AST 17  ALT 31  ALKPHOS 138*  BILITOT 0.4  PROT 7.2  ALBUMIN  3.8     Antibiotics: Anti-infectives (From admission, onward)    None        CONSULTS nephrology  Code Status: DNR  Family Communication: No family at bedside     Subjective   Patient became confused and agitated last night, required Ativan  during hemodialysis.  She is still somnolent, pleasantly confused.   Objective    Physical Examination:   Somnolent, confused, opens eyes to verbal stimuli S1-S2, regular Lungs bilateral rhonchi auscultated Abdomen is soft, nontender, no organomegaly Extremities no edema             [name] [name]   [facility] If 7PM-7AM, please contact night-coverage at [email or web address], Office  [phone]   03/25/2024, 8:01 AM  LOS: 1 day

## 2024-03-25 NOTE — Progress Notes (Signed)
  [facility] Progress Note  Subjective:  Seen in ED room Upset about everything 2.9 L off w/ HD yesterday  Presentation summary: 64 y.o. year-old w/ PMH of COPD, CHF, ESRD not doing regular dialysis, who presented to ED yesterday saying she has not had dialysis in 2 weeks and is short of breath having chest pain and productive cough.  No fevers.  Some leg swelling.  Ran out of her meds as well.  In the ED BP 148/78, HR 83, RR 11, temp 97.4.  100% sats on 4 L nasal cannula, 72% sat on room air.  Sodium 128 3, potassium 5.3, BUN 93, creatinine 5.8, pro BNP 35K, troponin 148, hemoglobin 7.0, WBC 7.8.  Chest x-ray shows bilateral pleural effusions mild, and some bibasilar opacities consistent with atelectasis.  Patient received albuterol , DuoNeb, Percocet, Lokelma .  Earlier this morning she was tried on BiPAP.  We are asked to see for dialysis. Pt seen in ED room.  Patient is in tears.  She has no family to take care of her.  Her ex-husband has allowed her to stay with him for a while but recently said she has to get out.  She says she has money for a motel.  I told her she probably needs SNF placement, if she qualifies.  Vitals:   03/25/24 1227 03/25/24 1300 03/25/24 1440 03/25/24 1443  BP: 130/74 (!) 148/75 (!) 160/62   Pulse:  78  73  Resp:  18 (!) 22   Temp: (!) 97.3 F (36.3 C)   (!) 97.5 F (36.4 C)  TempSrc: Oral   Oral  SpO2:  97%  98%  Weight:        Exam: Gen alert, no distress, 4 L  O2 + jvd no bruits Chest bilat rhonchi, some crackles and wheezing Not in distress RRR no MRG Abd firm w/ 2+ ascites, +bs Ext 1+ pretib edema bilat Neuro is alert, Ox 3 , nf    RUA aVF+bruit    Home bp meds: Norvasc  Coreg  Demadex     OP HD: from 2024 -->  3h  B350 Heparin  none  RUA AVF     Assessment/ Plan:   # ESRD - does not have outpt HD unit - last HD about 2 wks ago - plan HD today upstairs     # HTN - ran out of her medications 2 wks ago - resume home  meds prn after HD today      # Volume - overloaded w/ vasc congestion on CXR, LE edema, hypoxia and ascites - max UF w/ HD today    # Hyponatremia - due to vol overload, should improve w/ HD   # Anemia of esrd - Hb 7- 9 here, follow, transfuse prn    # COPD   # Ascites - related to vol overload in esrd patient     Myer Fret MD  [facility] 03/25/2024, 3:44 PM  Recent Labs  Lab 03/23/24 1850 03/24/24 0637 03/25/24 0450  HGB 7.2* 7.0* 8.1*  ALBUMIN  3.8  --   --   CALCIUM  7.7* 7.6* 7.8*  CREATININE 5.80* 5.88* 3.88*  K 5.3* 5.2* 4.4   No results for input(s): IRON , TIBC, FERRITIN in the last 168 hours. Inpatient medications:  sodium chloride    Intravenous Once   amLODipine   10 mg Oral Daily   atorvastatin   10 mg Oral Daily   budesonide -glycopyrrolate -formoterol   2 puff Inhalation BID   busPIRone   5 mg Oral TID   calcitRIOL   0.25 mcg Oral Daily   carvedilol   6.25 mg Oral BID WC   Chlorhexidine  Gluconate Cloth  6 each Topical Q0600   gabapentin   100 mg Oral TID   heparin  injection (subcutaneous)  5,000 Units Subcutaneous Q8H   pantoprazole   40 mg Oral BID   sodium bicarbonate   1,300 mg Oral BID   torsemide   100 mg Oral Daily    acetaminophen  **OR** acetaminophen , ipratropium-albuterol 

## 2024-03-25 NOTE — Discharge Instructions (Signed)

## 2024-03-25 NOTE — ED Notes (Signed)
Floor notified.

## 2024-03-26 ENCOUNTER — Inpatient Hospital Stay (HOSPITAL_COMMUNITY)

## 2024-03-26 DIAGNOSIS — Z992 Dependence on renal dialysis: Secondary | ICD-10-CM

## 2024-03-26 DIAGNOSIS — N186 End stage renal disease: Secondary | ICD-10-CM

## 2024-03-26 HISTORY — PX: IR PARACENTESIS: IMG2679

## 2024-03-26 LAB — HEPATITIS B SURFACE ANTIBODY, QUANTITATIVE: Hep B S AB Quant (Post): <3.5 m[IU]/mL — ABNORMAL LOW

## 2024-03-26 LAB — MRSA NEXT GEN BY PCR, NASAL: MRSA by PCR Next Gen: NOT DETECTED

## 2024-03-26 MED ORDER — LIDOCAINE-EPINEPHRINE 1 %-1:100000 IJ SOLN
INTRAMUSCULAR | Status: AC
  Filled 2024-03-26: qty 20

## 2024-03-26 MED ORDER — LIDOCAINE-EPINEPHRINE 1 %-1:100000 IJ SOLN
20.0000 mL | Freq: Once | INTRAMUSCULAR | Status: AC
  Administered 2024-03-26: 10 mL via INTRADERMAL

## 2024-03-26 MED ORDER — HALOPERIDOL LACTATE 5 MG/ML IJ SOLN
5.0000 mg | Freq: Once | INTRAMUSCULAR | Status: AC | PRN
  Administered 2024-03-26: 5 mg via INTRAVENOUS
  Filled 2024-03-26: qty 1

## 2024-03-26 MED ORDER — HYDROMORPHONE HCL 1 MG/ML IJ SOLN
0.5000 mg | Freq: Once | INTRAMUSCULAR | Status: AC | PRN
  Administered 2024-03-26: 0.5 mg via INTRAVENOUS
  Filled 2024-03-26: qty 1

## 2024-03-26 MED ORDER — LORAZEPAM 2 MG/ML IJ SOLN
1.0000 mg | Freq: Once | INTRAMUSCULAR | Status: AC | PRN
  Administered 2024-03-26: 1 mg via INTRAVENOUS
  Filled 2024-03-26: qty 1

## 2024-03-26 MED ORDER — SIMETHICONE 80 MG PO CHEW
80.0000 mg | CHEWABLE_TABLET | Freq: Four times a day (QID) | ORAL | Status: AC
  Administered 2024-03-26 – 2024-03-27 (×6): 80 mg via ORAL
  Filled 2024-03-26 (×7): qty 1

## 2024-03-26 NOTE — Progress Notes (Signed)
 Patient crying, rocking back and forth and moaning. She is saying she wants something for anxiety and can not sit still. Attempted to offer emotional support; however, patient can not be calmed or redirected.  Paged Dr. Charlton for something for anxiety. Ativan  ordered.

## 2024-03-26 NOTE — Procedures (Signed)
 PROCEDURE SUMMARY:  Successful image-guided paracentesis from the left abdomen.  Yielded 675 milliliters of clear, yellow fluid.  No immediate complications.  EBL: zero Patient tolerated well.   Specimen not sent for labs.  Of note, patient was not fully cooperative during procedure, required constant direction to not change positions. Ideally, patient would have remained in a LPO position throughout the procedure to maximize output, but she was unable to do this, contributing to modest output volume.  Please see imaging section of Epic for full dictation.  Elchonon Maxson NP 03/26/2024 2:12 PM

## 2024-03-26 NOTE — Progress Notes (Addendum)
 [facility]  PROGRESS NOTE  [name] FMW:[id number] DOB: Jul 24, 1960 DOA: 03/23/2024 PCP: Pcp, No   Brief HPI:    64 y.o. female with medical history significant of ESRD on HD with history of noncompliance due to homelessness, HFpEF, COPD, polysubstance abuse, hypertension, hyperlipidemia, GERD, anxiety, depression, anemia, hepatic cirrhosis presenting with complaints of chest pain and shortness of breath.  Patient is a poor historian.  She is reporting substernal chest heaviness and shortness of breath for several days.  She was admitted to the hospital last month for volume overload and has not gone for dialysis since she was discharged 2 weeks ago.  She reports taking all of her medications but thinks she will start running out of them soon.        Assessment/Plan:   Acute hypoxemic respiratory failure secondary to volume overload -Significantly improved after hemodialysis ESRD with dialysis noncompliance due to homelessness  Last echo done 10/23/2023 showing EF 60 to 65%, grade 2 diastolic dysfunction, left atrium moderately dilated, and mild mitral regurgitation. proBNP >35,000.  Chest x-ray showing vascular congestion and small to moderate bilateral pleural effusions.   Nephrology consulted; underwent hemodialysis yesterday.  Continue home torsemide .   Continue supplemental oxygen , wean as tolerated.     Chest pain -Resolved Initial EKG showing new T wave inversions in inferior leads which appear improved on repeat EKG.  Troponin 148> 147, not consistent with ACS.    Echocardiogram ordered to assess for wall motion abnormalities. -Symptom likely in setting of missed hemodialysis with worsening pulmonary edema and vascular congestion   Hepatic cirrhosis Noted to have a tense distended abdomen with normal bowel sounds and no tenderness to palpation.  Patient denies abdominal pain, nausea, or vomiting.  She is having bowel movements and last one was yesterday.  Bowel  obstruction less likely.  Suspect ascites but no fever or leukocytosis to suggest SBP at this time.  Ultrasound ordered for further evaluation.   - Will get IR consult for paracentesis   Chronic anemia Hemoglobin currently 8.1,  - Baseline hemoglobin around 8-9 -Patient is not endorsing any symptoms of GI bleed or any other bleeding.     Hyponatremia Mild hyperkalemia Hypocalcemia Chronic mild metabolic acidosis Resolved   COPD Patient does have bilateral rhonchi.   Placed on Breztri  which is for hospital formulary replacement for her home Trelegy.  DuoNeb PRN.   Hypertension Continue amlodipine  and Coreg .   Hyperlipidemia Continue Lipitor.   GERD Continue Protonix .   Anxiety/depression Continue BuSpar .   Chronic pain/neuropathy Continue gabapentin .   Homelessness TOC consulted.      DVT prophylaxis: Heparin   Medications     sodium chloride    Intravenous Once   amLODipine   10 mg Oral Daily   atorvastatin   10 mg Oral Daily   budesonide -glycopyrrolate -formoterol   2 puff Inhalation BID   busPIRone   5 mg Oral TID   calcitRIOL   0.25 mcg Oral Daily   carvedilol   6.25 mg Oral BID WC   Chlorhexidine  Gluconate Cloth  6 each Topical Q0600   Chlorhexidine  Gluconate Cloth  6 each Topical Q0600   gabapentin   100 mg Oral TID   heparin  injection (subcutaneous)  5,000 Units Subcutaneous Q8H   pantoprazole   40 mg Oral BID   torsemide   100 mg Oral Daily     Data Reviewed:   CBG:  No results for input(s): GLUCAP in the last 168 hours.  SpO2: 96 % O2 Flow Rate (L/min): 4 L/min FiO2 (%): 40 %  Vitals:   03/26/24 0005 03/26/24 0341 03/26/24 0737 03/26/24 0844  BP: (!) 145/79 139/73 138/72   Pulse: 72 70 72   Resp: 20 20 18    Temp: 97.6 F (36.4 C) 97.6 F (36.4 C) (!) 97.3 F (36.3 C)   TempSrc: Oral Oral Oral   SpO2: 99% 98% 100% 96%  Weight:  65.1 kg    Height:          Data Reviewed:  Basic Metabolic Panel: Recent Labs  Lab 03/23/24 1850  03/24/24 0637 03/25/24 0450  NA 123* 125* 127*  K 5.3* 5.2* 4.4  CL 83* 84* 87*  CO2 18* 20* 25  GLUCOSE 93 99 98  BUN 93* 93* 53*  CREATININE 5.80* 5.88* 3.88*  CALCIUM  7.7* 7.6* 7.8*    CBC: Recent Labs  Lab 03/23/24 1850 03/24/24 0637 03/25/24 0450  WBC 9.2 7.8 7.8  NEUTROABS 7.6  --   --   HGB 7.2* 7.0* 8.1*  HCT 22.7* 22.4* 25.3*  MCV 102.3* 101.8* 97.3  PLT 459* 424* 375    LFT Recent Labs  Lab 03/23/24 1850  AST 17  ALT 31  ALKPHOS 138*  BILITOT 0.4  PROT 7.2  ALBUMIN  3.8     Antibiotics: Anti-infectives (From admission, onward)    None        CONSULTS nephrology  Code Status: DNR  Family Communication: No family at bedside     Subjective   Patient seen, breathing is better.  Confusion has resolved   Objective    Physical Examination:   Alert, oriented x 3 Lungs-bilateral rhonchi auscultated Abdomen is soft, nontender, no organomegaly Extremities no edema       Robyne Matar S Sueo Cullen   [facility] If 7PM-7AM, please contact night-coverage at [email or web address], Office  [phone]   03/26/2024, 9:20 AM  LOS: 2 days

## 2024-03-26 NOTE — Progress Notes (Addendum)
 Patient is not compliant with the Bipap.Nurse is aware Bipap is on standby

## 2024-03-26 NOTE — Progress Notes (Signed)
 PRN Nebulizer treatment given at this time.    03/26/24 1645  Charting Type  Focused Reassessment No Changes Respiratory  Respiratory  Respiratory Pattern Dyspnea with exertion  Chest Assessment Chest expansion symmetrical  R Upper  Breath Sounds Expiratory wheezes  L Upper Breath Sounds Expiratory wheezes  R Lower Breath Sounds Diminished  L Lower Breath Sounds Diminished  Respiratory (WDL) X

## 2024-03-26 NOTE — Progress Notes (Signed)
  [facility] Progress Note  Subjective:  Seen in hospital room Paracentesis w/ only 675cc removed  Presentation summary: 64 y.o. year-old w/ PMH of COPD, CHF, ESRD not doing regular dialysis, who presented to ED yesterday saying she has not had dialysis in 2 weeks and is short of breath having chest pain and productive cough.  No fevers.  Some leg swelling.  Ran out of her meds as well.  In the ED BP 148/78, HR 83, RR 11, temp 97.4.  100% sats on 4 L nasal cannula, 72% sat on room air.  Sodium 128 3, potassium 5.3, BUN 93, creatinine 5.8, pro BNP 35K, troponin 148, hemoglobin 7.0, WBC 7.8.  Chest x-ray shows bilateral pleural effusions mild, and some bibasilar opacities consistent with atelectasis.  Patient received albuterol , DuoNeb, Percocet, Lokelma .  Earlier this morning she was tried on BiPAP.  We are asked to see for dialysis. Pt seen in ED room.  Patient is in tears.  She has no family to take care of her.  Her ex-husband has allowed her to stay with him for a while but recently said she has to get out.  She says she has money for a motel.  I told her she probably needs SNF placement, if she qualifies.  Vitals:   03/26/24 0844 03/26/24 1243 03/26/24 1319 03/26/24 1521  BP:  (!) 162/83 (!) 153/95   Pulse:  82    Resp:  20  19  Temp:  (!) 97.4 F (36.3 C)    TempSrc:  Oral  Oral  SpO2: 96% 95%    Weight:      Height:        Exam: Gen alert, no distress, 4 L [place] O2 + jvd no bruits Chest bilat rhonchi, a bit better, some clear air movement Not in distress RRR no MRG Abd firm w/ 2+ ascites, +bs Ext trace pretib edema bilat Neuro is alert, Ox 3 , nf    RUA aVF+bruit    Home bp meds: Norvasc  Coreg  Demadex     OP HD: from 2024 -->  3h  B350 Heparin  none  RUA AVF     Assessment/ Plan:   # ESRD - does not have outpt HD unit - last HD about 2 wks ago - doing HD mwf here - had HD here Wed - next HD today upstairs     # HTN - ran out of her medications  2 wks ago - resume home meds here per pmd     # Volume - overloaded w/ vasc congestion on CXR, LE edema, hypoxia and ascites - max UF w/ HD today again  - is now s/p paracentesis but not much removed   # Hyponatremia - due to vol overload - cont to lower vol excess   # Anemia of esrd - Hb 7- 9 here, follow, transfuse prn    # COPD   # Ascites - abd tense but only 800 cc removed by IR today     Myer Fret MD  [facility] 03/26/2024, 3:43 PM  Recent Labs  Lab 03/23/24 1850 03/24/24 0637 03/25/24 0450  HGB 7.2* 7.0* 8.1*  ALBUMIN  3.8  --   --   CALCIUM  7.7* 7.6* 7.8*  CREATININE 5.80* 5.88* 3.88*  K 5.3* 5.2* 4.4   No results for input(s): IRON , TIBC, FERRITIN in the last 168 hours. Inpatient medications:  sodium chloride    Intravenous Once   amLODipine   10 mg Oral Daily   atorvastatin   10  mg Oral Daily   budesonide -glycopyrrolate -formoterol   2 puff Inhalation BID   busPIRone   5 mg Oral TID   calcitRIOL   0.25 mcg Oral Daily   carvedilol   6.25 mg Oral BID WC   Chlorhexidine  Gluconate Cloth  6 each Topical Q0600   Chlorhexidine  Gluconate Cloth  6 each Topical Q0600   gabapentin   100 mg Oral TID   heparin  injection (subcutaneous)  5,000 Units Subcutaneous Q8H   pantoprazole   40 mg Oral BID   simethicone   80 mg Oral QID   torsemide   100 mg Oral Daily    acetaminophen  **OR** acetaminophen , ipratropium-albuterol 

## 2024-03-26 NOTE — Progress Notes (Signed)
 Transported off unit to Radiology/ Interventional Radiology at this time.  Transport method: Bed Escorted by: Patient Transporter Quarry Manager

## 2024-03-26 NOTE — Progress Notes (Signed)
 About to complete admission, sitter outside of room notified me of patient's unstable behavior. Safety concern. May return to complete admission once behavior has resolved.

## 2024-03-26 NOTE — Plan of Care (Signed)
 Awaiting hemodialysis treatment.   Problem: Education: Goal: Knowledge of General Education information will improve Description: Including pain rating scale, medication(s)/side effects and non-pharmacologic comfort measures Outcome: Progressing   Problem: Health Behavior/Discharge Planning: Goal: Ability to manage health-related needs will improve Outcome: Progressing   Problem: Clinical Measurements: Goal: Ability to maintain clinical measurements within normal limits will improve Outcome: Progressing Goal: Will remain free from infection Outcome: Progressing Goal: Diagnostic test results will improve Outcome: Progressing

## 2024-03-26 NOTE — Progress Notes (Signed)
 Called and spoke to [facility] in Hemodialysis unit.  Patient is pended for hemodialysis this afternoon.  Held AM dose of carvedilol .

## 2024-03-27 LAB — IRON AND TIBC
Iron: 54 ug/dL (ref 28–170)
Saturation Ratios: 19 % (ref 10.4–31.8)
TIBC: 281 ug/dL (ref 250–450)
UIBC: 228 ug/dL

## 2024-03-27 LAB — FERRITIN: Ferritin: 633 ng/mL — ABNORMAL HIGH (ref 11–307)

## 2024-03-27 MED ORDER — THIAMINE HCL 100 MG/ML IJ SOLN
100.0000 mg | Freq: Every day | INTRAMUSCULAR | Status: DC
  Filled 2024-03-27: qty 2

## 2024-03-27 MED ORDER — LORAZEPAM 2 MG/ML IJ SOLN
1.0000 mg | INTRAMUSCULAR | Status: DC | PRN
  Administered 2024-03-27: 1 mg via INTRAVENOUS
  Filled 2024-03-27: qty 1

## 2024-03-27 MED ORDER — ADULT MULTIVITAMIN W/MINERALS CH
1.0000 | ORAL_TABLET | Freq: Every day | ORAL | Status: DC
  Administered 2024-03-27 – 2024-04-15 (×18): 1 via ORAL
  Filled 2024-03-27 (×19): qty 1

## 2024-03-27 MED ORDER — THIAMINE MONONITRATE 100 MG PO TABS
100.0000 mg | ORAL_TABLET | Freq: Every day | ORAL | Status: DC
  Administered 2024-03-27 – 2024-04-15 (×18): 100 mg via ORAL
  Filled 2024-03-27 (×19): qty 1

## 2024-03-27 MED ORDER — LORAZEPAM 1 MG PO TABS
1.0000 mg | ORAL_TABLET | ORAL | Status: DC | PRN
  Administered 2024-03-27 – 2024-03-29 (×3): 1 mg via ORAL
  Filled 2024-03-27 (×2): qty 1

## 2024-03-27 MED ORDER — FOLIC ACID 1 MG PO TABS
1.0000 mg | ORAL_TABLET | Freq: Every day | ORAL | Status: DC
  Administered 2024-03-27 – 2024-04-15 (×18): 1 mg via ORAL
  Filled 2024-03-27 (×19): qty 1

## 2024-03-27 MED ORDER — HALOPERIDOL LACTATE 5 MG/ML IJ SOLN
5.0000 mg | Freq: Once | INTRAMUSCULAR | Status: AC | PRN
  Administered 2024-03-27: 5 mg via INTRAVENOUS
  Filled 2024-03-27: qty 1

## 2024-03-27 MED ORDER — LORAZEPAM 2 MG/ML IJ SOLN
0.0000 mg | Freq: Four times a day (QID) | INTRAMUSCULAR | Status: DC
  Administered 2024-03-27 – 2024-03-28 (×3): 2 mg via INTRAVENOUS
  Filled 2024-03-27 (×4): qty 1

## 2024-03-27 MED ORDER — LORAZEPAM 2 MG/ML IJ SOLN
0.0000 mg | Freq: Two times a day (BID) | INTRAMUSCULAR | Status: DC
  Administered 2024-03-30: 2 mg via INTRAVENOUS
  Filled 2024-03-27: qty 1

## 2024-03-27 NOTE — Progress Notes (Signed)
 [facility]  PROGRESS NOTE  [name] [name] FMW:[id number] DOB: 06-01-1960 DOA: 03/23/2024 PCP: Pcp, No   Brief HPI:    64 y.o. female with medical history significant of ESRD on HD with history of noncompliance due to homelessness, HFpEF, COPD, polysubstance abuse, hypertension, hyperlipidemia, GERD, anxiety, depression, anemia, hepatic cirrhosis presenting with complaints of chest pain and shortness of breath.  Patient is a poor historian.  She is reporting substernal chest heaviness and shortness of breath for several days.  She was admitted to the hospital last month for volume overload and has not gone for dialysis since she was discharged 2 weeks ago.  She reports taking all of her medications but thinks she will start running out of them soon.        Assessment/Plan:   Acute hypoxemic respiratory failure secondary to volume overload -Significantly improved after hemodialysis ESRD with dialysis noncompliance due to homelessness  Last echo done 10/23/2023 showing EF 60 to 65%, grade 2 diastolic dysfunction, left atrium moderately dilated, and mild mitral regurgitation. proBNP >35,000.  Chest x-ray showing vascular congestion and small to moderate bilateral pleural effusions.   Nephrology consulted; underwent hemodialysis yesterday.  Continue home torsemide .   Continue supplemental oxygen , wean as tolerated.     Chest pain -Resolved Initial EKG showing new T wave inversions in inferior leads which appear improved on repeat EKG.  Troponin 148> 147, not consistent with ACS.    Echocardiogram ordered to assess for wall motion abnormalities. -Symptom likely in setting of missed hemodialysis with worsening pulmonary edema and vascular congestion   Hepatic cirrhosis Noted to have a tense distended abdomen with normal bowel sounds and no tenderness to palpation.  Patient denies abdominal pain, nausea, or vomiting.  She is having bowel movements and last one was yesterday.  Bowel  obstruction less likely.  Suspect ascites but no fever or leukocytosis to suggest SBP at this time.  Ultrasound ordered for further evaluation.   - IR consulted, underwent paracentesis with only 675 mL of clear yellow fluid obtained   Chronic anemia Hemoglobin currently 8.1,  - Baseline hemoglobin around 8-9 -Patient is not endorsing any symptoms of GI bleed or any other bleeding.     Hyponatremia Mild hyperkalemia Hypocalcemia Chronic mild metabolic acidosis Resolved   COPD Patient does have bilateral rhonchi.   Placed on Breztri  which is for hospital formulary replacement for her home Trelegy.  DuoNeb PRN.   Hypertension Continue amlodipine  and Coreg .   Hyperlipidemia Continue Lipitor.   GERD Continue Protonix .   Anxiety/depression Continue BuSpar .   Chronic pain/neuropathy Continue gabapentin .   Homelessness TOC consulted.      DVT prophylaxis: Heparin   Medications     sodium chloride    Intravenous Once   amLODipine   10 mg Oral Daily   atorvastatin   10 mg Oral Daily   budesonide -glycopyrrolate -formoterol   2 puff Inhalation BID   busPIRone   5 mg Oral TID   calcitRIOL   0.25 mcg Oral Daily   carvedilol   6.25 mg Oral BID WC   Chlorhexidine  Gluconate Cloth  6 each Topical Q0600   Chlorhexidine  Gluconate Cloth  6 each Topical Q0600   gabapentin   100 mg Oral TID   heparin  injection (subcutaneous)  5,000 Units Subcutaneous Q8H   pantoprazole   40 mg Oral BID   simethicone   80 mg Oral QID   torsemide   100 mg Oral Daily     Data Reviewed:   CBG:  No results for input(s): GLUCAP in the last 168 hours.  SpO2:  96 % O2 Flow Rate (L/min): 4 L/min FiO2 (%): 40 %    Vitals:   03/27/24 0059 03/27/24 0310 03/27/24 0727 03/27/24 0749  BP:  (!) 160/44    Pulse:  80 75 84  Resp:  20 18 18   Temp:  (!) 97.4 F (36.3 C)  97.6 F (36.4 C)  TempSrc:  Oral  Oral  SpO2:  96%  96%  Weight: 65.1 kg 65.2 kg    Height:          Data Reviewed:  Basic  Metabolic Panel: Recent Labs  Lab 03/23/24 1850 03/24/24 0637 03/25/24 0450  NA 123* 125* 127*  K 5.3* 5.2* 4.4  CL 83* 84* 87*  CO2 18* 20* 25  GLUCOSE 93 99 98  BUN 93* 93* 53*  CREATININE 5.80* 5.88* 3.88*  CALCIUM  7.7* 7.6* 7.8*    CBC: Recent Labs  Lab 03/23/24 1850 03/24/24 0637 03/25/24 0450  WBC 9.2 7.8 7.8  NEUTROABS 7.6  --   --   HGB 7.2* 7.0* 8.1*  HCT 22.7* 22.4* 25.3*  MCV 102.3* 101.8* 97.3  PLT 459* 424* 375    LFT Recent Labs  Lab 03/23/24 1850  AST 17  ALT 31  ALKPHOS 138*  BILITOT 0.4  PROT 7.2  ALBUMIN  3.8     Antibiotics: Anti-infectives (From admission, onward)    None        CONSULTS nephrology  Code Status: DNR  Family Communication: No family at bedside     Subjective   Patient has been confused intermittently.  Breathing has improved.  Underwent paracentesis yesterday   Objective    Physical Examination:  Appears in no acute distress S1-S2, regular Lungs-bilateral wheezing auscultated Abdomen-distended, nontender  to palpation       [name] [name] S [place]   [facility] If 7PM-7AM, please contact night-coverage at [email or web address], Office  [phone]   03/27/2024, 10:02 AM  LOS: 3 days

## 2024-03-27 NOTE — Progress Notes (Signed)
 Called from dialysis to come to dialysis to assist the sitter with patient. Patient not follow directions, pulling at dressings, tubes, tele wires.   Went to HD to assist. Patient very confused, continually attempting to get out of bed, drowsy, not stable on feet. Pulling at lines. Patient's leg bleeding due to her removing a dressing. Patient picking at other areas of skin.  Fistula accessed but dialysis session not started. Dialysis RN preparing to decannulate fistula as not safe to do run dialysis session with patient's current behavior.  Back to floor, paged MD for restraints. Restraints applied at 0015. Sitter in Room. Haldol  given.   Attempted to contact patient's daughter to inform of restraint application. No answer but left message for daughter to call back.

## 2024-03-27 NOTE — Progress Notes (Signed)
" °   03/26/24 2130  Pain Assessment  Pain Scale 0-10  Fistula / Graft Right Upper arm Arteriovenous fistula  Placement Date/Time: 08/14/22 1230   Orientation: Right  Access Location: (c) Upper arm  Access Type: Arteriovenous fistula  Site Condition No complications  Neurological  Level of Consciousness Alert  Respiratory  Respiratory Pattern Dyspnea at rest  Cardiac  Pulse Regular  Heart Sounds S1, S2   Unable to dialyze Patient due to restlessness and unpredictable behavior. Nephrologist on call informed. Treatment postpone until further evaluation. "

## 2024-03-27 NOTE — Progress Notes (Signed)
 " [facility] Progress Note   Subjective:   Patient seen and examined at bedside.  Sitter present.  Admits to shortness of breath.  States she can not do anything because her arms are tied down.  Patient confused, moving her arms up and down in the bed. Gets more agitated as we talk.  Requesting to have restraints removed.  States I can't do anything with these on.  Discussed needing her to be calm and not move around during dialysis so she can get fluid off and start breathing better.  States I need these off so I can do that.  Discussed she has to keep her arms still for dialysis because she will have needles in her arm and moving her arms can cause the needles to dislodge.  Continues to repeat I needs these off so I can do that. Will you take these off of me.   Objective Vitals:   03/27/24 0310 03/27/24 0727 03/27/24 0749 03/27/24 1210  BP: (!) 160/44   (!) 161/83  Pulse: 80 75 84 73  Resp: 20 18 18 20   Temp: (!) 97.4 F (36.3 C)  97.6 F (36.4 C) 97.7 F (36.5 C)  TempSrc: Oral  Oral Oral  SpO2: 96%  96% 96%  Weight: 65.2 kg     Height:       Physical Exam General:Agitated, moving arms up and down in bed Heart:RRR Lungs:+wheezing, rhonchi, crackles b/l Abdomen:hard, distended, +edema Extremities:1-2+ LE edema from hips to feet b/l, 1+ UE edema b/l Dialysis Access: RU AVF +b   Filed Weights   03/26/24 0341 03/27/24 0059 03/27/24 0310  Weight: 65.1 kg 65.1 kg 65.2 kg    Intake/Output Summary (Last 24 hours) at 03/27/2024 1605 Last data filed at 03/27/2024 1211 Gross per 24 hour  Intake 838 ml  Output --  Net 838 ml    Additional Objective Labs: Basic Metabolic Panel: Recent Labs  Lab 03/23/24 1850 03/24/24 0637 03/25/24 0450  NA 123* 125* 127*  K 5.3* 5.2* 4.4  CL 83* 84* 87*  CO2 18* 20* 25  GLUCOSE 93 99 98  BUN 93* 93* 53*  CREATININE 5.80* 5.88* 3.88*  CALCIUM  7.7* 7.6* 7.8*   Liver Function Tests: Recent Labs  Lab 03/23/24 1850   AST 17  ALT 31  ALKPHOS 138*  BILITOT 0.4  PROT 7.2  ALBUMIN  3.8   CBC: Recent Labs  Lab 03/23/24 1850 03/24/24 0637 03/25/24 0450  WBC 9.2 7.8 7.8  NEUTROABS 7.6  --   --   HGB 7.2* 7.0* 8.1*  HCT 22.7* 22.4* 25.3*  MCV 102.3* 101.8* 97.3  PLT 459* 424* 375   Studies/Results: IR Paracentesis Result Date: 03/26/2024 INDICATION: 64 year old female with history of HFpEF/ESRD with volume overload. She has previously undergone paracentesis (last puncture 01/02/24, 2.8 L output), and increased ascites was noted in US  performed during this admission. IR was requested for therapeutic paracentesis. EXAM: ULTRASOUND GUIDED THERAPEUTIC PARACENTESIS MEDICATIONS: 8 mL 1% lidocaine  with epinephrine  COMPLICATIONS: None immediate. PROCEDURE: Informed written consent was obtained from the patient after a discussion of the risks, benefits and alternatives to treatment. A timeout was performed prior to the initiation of the procedure. Initial ultrasound scanning demonstrates a small to moderate amount of ascites within the right lower abdominal quadrant and moderate amount of ascites left lower quadrant achieved by positioning patient in LPO. The left lower abdomen was prepped and draped in the usual sterile fashion. 1% lidocaine  with epinephrine  was used for local anesthesia.  Following this, a 19 gauge, 7-cm, Yueh catheter was introduced. An ultrasound image was saved for documentation purposes. The paracentesis was performed. The catheter was removed and a dressing was applied. Of note, patient was not fully cooperative during procedure, required constant direction to not change positions. Ideally, patient would have remained in a LPO position throughout the procedure to maximize output, but she was unable to do this, contributing to modest output volume. The patient tolerated the procedure well without immediate post procedural complication. FINDINGS: A total of approximately 675 milliliters of clear, yellow  fluid was removed. IMPRESSION: Successful ultrasound-guided paracentesis yielding 675 milliliters of peritoneal fluid. Performed by Laymon Coast, NP under the supervision of Dr. Ester Sides. Electronically Signed   By: Ester Sides M.D.   On: 03/26/2024 14:35    Medications:   sodium chloride    Intravenous Once   amLODipine   10 mg Oral Daily   atorvastatin   10 mg Oral Daily   budesonide -glycopyrrolate -formoterol   2 puff Inhalation BID   busPIRone   5 mg Oral TID   calcitRIOL   0.25 mcg Oral Daily   carvedilol   6.25 mg Oral BID WC   Chlorhexidine  Gluconate Cloth  6 each Topical Q0600   Chlorhexidine  Gluconate Cloth  6 each Topical Q0600   folic acid   1 mg Oral Daily   gabapentin   100 mg Oral TID   heparin  injection (subcutaneous)  5,000 Units Subcutaneous Q8H   LORazepam   0-4 mg Intravenous Q6H   Followed by   NOREEN ON 03/29/2024] LORazepam   0-4 mg Intravenous Q12H   multivitamin with minerals  1 tablet Oral Daily   pantoprazole   40 mg Oral BID   simethicone   80 mg Oral QID   thiamine   100 mg Oral Daily   Or   thiamine   100 mg Intravenous Daily   torsemide   100 mg Oral Daily    Dialysis Orders: from 2024 -->  3h  B350 Heparin  none  RUA AVF No dialysis center  Assessment/Plan: 1. Volume overload - CXR on 1/6 with vascular congestion, pleural effusion and atelectasis.  Anasarca present.  +hypoxia on 3L O2. +Ascites. HD earlier this week with net UF 2.9L. Unable to dialyze last night due to agitation.  Plan for HD tonight for volume removal.  2. Ascites  - s/p paracentesis with net UF .  3. ESRD - Does not have regular HD center.  As above unable to dialyze last night due to agitation and erratic behavior.  Currently in restraints with ongoing agitation making dialysis unsafe.  Requested sedation from PMD to help safely dialyze patient and improve fluid status. Orders written for HD today. 4. Anemia of CKD- Hgb 8.1. Check iron  studies. Not on ESA. 5. Secondary  hyperparathyroidism - Ca a little low. Calcitriol  started.  Using increased Ca bath.  Check phosphorus.  6. HTN/volume - Blood pressure elevated.  Should improve with volume removal.  Continue home meds. 7. Nutrition - On regular diet, monitor labs.  Added fluid restrictions . 8. COPD - continue home meds.   Manuelita Labella, PA-C [facility] 03/27/2024,4:05 PM  LOS: 3 days    "

## 2024-03-27 NOTE — Progress Notes (Signed)
 Pt becoming more restless and fidgety towards end of shift. Pt scoring 6 on [place]. Pt given 1 mg IVP ativan  initially, became more restless. Pt then given an additional 1 mg of ativan  po at 1830. Pt seemed to relax and less restless at this time.

## 2024-03-27 NOTE — Plan of Care (Signed)

## 2024-03-28 LAB — BASIC METABOLIC PANEL WITH GFR
Anion gap: 13 (ref 5–15)
BUN: 31 mg/dL — ABNORMAL HIGH (ref 8–23)
CO2: 25 mmol/L (ref 22–32)
Calcium: 7.9 mg/dL — ABNORMAL LOW (ref 8.9–10.3)
Chloride: 88 mmol/L — ABNORMAL LOW (ref 98–111)
Creatinine, Ser: 3.03 mg/dL — ABNORMAL HIGH (ref 0.44–1.00)
GFR, Estimated: 17 mL/min — ABNORMAL LOW
Glucose, Bld: 85 mg/dL (ref 70–99)
Potassium: 4.1 mmol/L (ref 3.5–5.1)
Sodium: 126 mmol/L — ABNORMAL LOW (ref 135–145)

## 2024-03-28 LAB — CBC
HCT: 24.7 % — ABNORMAL LOW (ref 36.0–46.0)
Hemoglobin: 8.2 g/dL — ABNORMAL LOW (ref 12.0–15.0)
MCH: 31.2 pg (ref 26.0–34.0)
MCHC: 33.2 g/dL (ref 30.0–36.0)
MCV: 93.9 fL (ref 80.0–100.0)
Platelets: 331 K/uL (ref 150–400)
RBC: 2.63 MIL/uL — ABNORMAL LOW (ref 3.87–5.11)
RDW: 18.1 % — ABNORMAL HIGH (ref 11.5–15.5)
WBC: 5.9 K/uL (ref 4.0–10.5)
nRBC: 0 % (ref 0.0–0.2)

## 2024-03-28 MED ORDER — CHLORHEXIDINE GLUCONATE CLOTH 2 % EX PADS
6.0000 | MEDICATED_PAD | Freq: Every day | CUTANEOUS | Status: DC
  Administered 2024-03-29 – 2024-03-30 (×2): 6 via TOPICAL

## 2024-03-28 NOTE — Plan of Care (Signed)

## 2024-03-28 NOTE — Progress Notes (Signed)
 No sitter available at this time frequent rounding on patient

## 2024-03-28 NOTE — Progress Notes (Signed)
 " [facility] Progress Note   Subjective:    Pt seen in room Getting CIWA protocol for severe agitation Looks much better, sedated  Good HD overnight w/ 2.9 L UF   Objective Vitals:   03/28/24 0335 03/28/24 0428 03/28/24 0513 03/28/24 0605  BP: (!) 150/83 (!) 144/97  (!) 150/59  Pulse:  92 84 83  Resp:  18 16 18   Temp: 97.9 F (36.6 C) 98.8 F (37.1 C)  98.9 F (37.2 C)  TempSrc: Axillary Oral    SpO2: 95% 93% 97% 96%  Weight:      Height:       Physical Exam General: pt lethargic, mostly asleep, minimal conversation Heart: RRR Lungs: +wheezing, rhonchi, crackles b/l Abdomen: hard, distended, +edema Extremities: 1+ LE edema bilat  Dialysis Access: RU AVF +b   Filed Weights   03/26/24 0341 03/27/24 0059 03/27/24 0310  Weight: 65.1 kg 65.1 kg 65.2 kg    Intake/Output Summary (Last 24 hours) at 03/28/2024 0812 Last data filed at 03/28/2024 0335 Gross per 24 hour  Intake 240 ml  Output 2900 ml  Net -2660 ml    Additional Objective Labs: Basic Metabolic Panel: Recent Labs  Lab 03/23/24 1850 03/24/24 0637 03/25/24 0450  NA 123* 125* 127*  K 5.3* 5.2* 4.4  CL 83* 84* 87*  CO2 18* 20* 25  GLUCOSE 93 99 98  BUN 93* 93* 53*  CREATININE 5.80* 5.88* 3.88*  CALCIUM  7.7* 7.6* 7.8*   Liver Function Tests: Recent Labs  Lab 03/23/24 1850  AST 17  ALT 31  ALKPHOS 138*  BILITOT 0.4  PROT 7.2  ALBUMIN  3.8   CBC: Recent Labs  Lab 03/23/24 1850 03/24/24 0637 03/25/24 0450  WBC 9.2 7.8 7.8  NEUTROABS 7.6  --   --   HGB 7.2* 7.0* 8.1*  HCT 22.7* 22.4* 25.3*  MCV 102.3* 101.8* 97.3  PLT 459* 424* 375   Studies/Results: IR Paracentesis Result Date: 03/26/2024 INDICATION: 64 year old female with history of HFpEF/ESRD with volume overload. She has previously undergone paracentesis (last puncture 01/02/24, 2.8 L output), and increased ascites was noted in US  performed during this admission. IR was requested for therapeutic paracentesis. EXAM:  ULTRASOUND GUIDED THERAPEUTIC PARACENTESIS MEDICATIONS: 8 mL 1% lidocaine  with epinephrine  COMPLICATIONS: None immediate. PROCEDURE: Informed written consent was obtained from the patient after a discussion of the risks, benefits and alternatives to treatment. A timeout was performed prior to the initiation of the procedure. Initial ultrasound scanning demonstrates a small to moderate amount of ascites within the right lower abdominal quadrant and moderate amount of ascites left lower quadrant achieved by positioning patient in LPO. The left lower abdomen was prepped and draped in the usual sterile fashion. 1% lidocaine  with epinephrine  was used for local anesthesia. Following this, a 19 gauge, 7-cm, Yueh catheter was introduced. An ultrasound image was saved for documentation purposes. The paracentesis was performed. The catheter was removed and a dressing was applied. Of note, patient was not fully cooperative during procedure, required constant direction to not change positions. Ideally, patient would have remained in a LPO position throughout the procedure to maximize output, but she was unable to do this, contributing to modest output volume. The patient tolerated the procedure well without immediate post procedural complication. FINDINGS: A total of approximately 675 milliliters of clear, yellow fluid was removed. IMPRESSION: Successful ultrasound-guided paracentesis yielding 675 milliliters of peritoneal fluid. Performed by Laymon Coast, NP under the supervision of Dr. Ester Sides. Electronically Signed   By:  Ester Sides M.D.   On: 03/26/2024 14:35    Medications:   sodium chloride    Intravenous Once   amLODipine   10 mg Oral Daily   atorvastatin   10 mg Oral Daily   budesonide -glycopyrrolate -formoterol   2 puff Inhalation BID   busPIRone   5 mg Oral TID   calcitRIOL   0.25 mcg Oral Daily   carvedilol   6.25 mg Oral BID WC   Chlorhexidine  Gluconate Cloth  6 each Topical Q0600   Chlorhexidine   Gluconate Cloth  6 each Topical Q0600   folic acid   1 mg Oral Daily   gabapentin   100 mg Oral TID   heparin  injection (subcutaneous)  5,000 Units Subcutaneous Q8H   LORazepam   0-4 mg Intravenous Q6H   Followed by   NOREEN ON 03/29/2024] LORazepam   0-4 mg Intravenous Q12H   multivitamin with minerals  1 tablet Oral Daily   pantoprazole   40 mg Oral BID   simethicone   80 mg Oral QID   thiamine   100 mg Oral Daily   Or   thiamine   100 mg Intravenous Daily   torsemide   100 mg Oral Daily    Dialysis Orders: from 2024 -->  3h  B350 Heparin  none  RUA AVF No dialysis center currently   Assessment/Plan: 1. Volume overload - CXR on 1/6 with vascular congestion, pleural effusion and atelectasis.  +hypoxia on 3L O2. +Ascites. HD earlier this week with net UF 2.9L. Unable to dialyze Friday night due to agitation.  - had HD last night (Sat) w/ 2.9 L UF - would not have been able to dialyze this pt if not for CIWA protocol   2. Ascites  - s/p paracentesis with net UF .  3. ESRD - Does not have regular HD center. Had HD here on Wed and Sat night. Next HD Monday.  4. Anemia of CKD- Hgb 8.1. Check iron  studies. Not on ESA. 5. Secondary hyperparathyroidism - Ca a little low. Calcitriol  started.  Using increased Ca bath.  Check phosphorus.  6. HTN/volume - Blood pressure elevated, cont to lower volume w/ HD as tolerated 7. Nutrition - On regular diet, monitor labs.  Added fluid restrictions . 8. COPD - continue home meds.   Myer Fret  MD  [facility] 03/28/2024, 8:17 AM  Recent Labs  Lab 03/23/24 1850 03/24/24 0637 03/25/24 0450  HGB 7.2* 7.0* 8.1*  ALBUMIN  3.8  --   --   CALCIUM  7.7* 7.6* 7.8*  CREATININE 5.80* 5.88* 3.88*  K 5.3* 5.2* 4.4    Inpatient medications:  sodium chloride    Intravenous Once   amLODipine   10 mg Oral Daily   atorvastatin   10 mg Oral Daily   budesonide -glycopyrrolate -formoterol   2 puff Inhalation BID   busPIRone   5 mg Oral TID   calcitRIOL   0.25 mcg  Oral Daily   carvedilol   6.25 mg Oral BID WC   Chlorhexidine  Gluconate Cloth  6 each Topical Q0600   Chlorhexidine  Gluconate Cloth  6 each Topical Q0600   folic acid   1 mg Oral Daily   gabapentin   100 mg Oral TID   heparin  injection (subcutaneous)  5,000 Units Subcutaneous Q8H   LORazepam   0-4 mg Intravenous Q6H   Followed by   NOREEN ON 03/29/2024] LORazepam   0-4 mg Intravenous Q12H   multivitamin with minerals  1 tablet Oral Daily   pantoprazole   40 mg Oral BID   simethicone   80 mg Oral QID   thiamine   100 mg Oral Daily   Or  thiamine   100 mg Intravenous Daily   torsemide   100 mg Oral Daily    acetaminophen  **OR** acetaminophen , ipratropium-albuterol , LORazepam  **OR** LORazepam         "

## 2024-03-28 NOTE — Progress Notes (Signed)
 " PROGRESS NOTE    [name]  FMW:[id number] DOB: [birth year] DOA: 03/23/2024 PCP: Pcp, No   Brief Narrative:  64 y.o. female with medical history significant of ESRD on HD with history of noncompliance due to homelessness, HFpEF, COPD, polysubstance abuse, hypertension, hyperlipidemia, GERD, anxiety, depression, anemia, hepatic cirrhosis presenting with complaints of chest pain and shortness of breath.  Patient is a poor historian.  She is reporting substernal chest heaviness and shortness of breath for several days.  She was admitted to the hospital last month for volume overload and has not gone for dialysis since she was discharged 2 weeks ago.  She reports taking all of her medications.  Assessment & Plan:   Principal Problem:   Volume overload Active Problems:   CHF exacerbation (HCC)   ESRD on dialysis (HCC)   Hyperkalemia   COPD (chronic obstructive pulmonary disease) (HCC)   Etoh abuse, rule out withdrawals Acute encephalopathy - Continue on ciwa protocol - Somnolent overnight possibly secondary to intolerance to ativan  overnight - use sparingly moving forward only if scoring highly on ciwa  Acute hypoxemic respiratory failure secondary to volume overload -Significantly improved after hemodialysis ESRD with dialysis noncompliance due to homelessness Last echo done 10/23/2023 showing EF 60 to 65%, grade 2 diastolic dysfunction, left atrium moderately dilated, and mild mitral regurgitation Chest x-ray showing vascular congestion and small to moderate bilateral pleural effusions.  Nephrology following, appreciate insight and recommendations Continue home torsemide .   Continue supplemental oxygen , wean as tolerated -transient episode of respiratory distress this morning after initiating CIWA protocol, BiPAP placed but subsequently removed due to intolerance/agitation patient improved on nasal cannula alone   Chest pain-resolved Troponin minimally elevated at intake, flat, not  consistent with ACS -Echo noted global hypokinesis grade 1 diastolic dysfunction EF 50 to 44% -Symptom likely in setting of missed hemodialysis with worsening pulmonary edema and vascular congestion   Hepatic cirrhosis Alcohol  use/abuse as above -Likely chronic in the setting of alcohol  use and abuse Paracentesis with only 675 mL of clear yellow fluid obtained by IR, continue to follow   Chronic anemia At baseline, no signs or symptoms of bleeding  Hyponatremia Mild hyperkalemia Hypocalcemia Chronic mild metabolic acidosis Resolved -advance diet as tolerated, electrolytes per dialysis   COPD Without acute exacerbation, continue home inhalers, nebs, supplemental oxygen  as above Hypertension Continue amlodipine  and Coreg . Hyperlipidemia Continue Lipitor. GERD Continue Protonix . Anxiety/depression Continue BuSpar . Chronic pain/neuropathy Continue gabapentin . Homelessness TOC consulted.  DVT prophylaxis: heparin  injection 5,000 Units Start: 03/25/24 1400 SCDs Start: 03/24/24 9380 Code Status:   Code Status: Limited: Do not attempt resuscitation (DNR) -DNR-LIMITED -Do Not Intubate/DNI  Family Communication: None present  Status is: Inpatient  Dispo: The patient is from: Homeless              Anticipated d/c is to: To be determined              Anticipated d/c date is: To be determined              Patient currently not medically stable for discharge  Consultants:  Nephrology  Procedures:  Paracentesis  Antimicrobials:  None  Subjective: Overnight patient had worsening mental status concerning for alcohol  withdrawals, CIWA protocol initiated, this morning patient remains somnolent review of systems markedly limited  Objective: Vitals:   03/28/24 0335 03/28/24 0428 03/28/24 0513 03/28/24 0605  BP: (!) 150/83 (!) 144/97  (!) 150/59  Pulse:  92 84 83  Resp:  18 16 18   Temp:  97.9 F (36.6 C) 98.8 F (37.1 C)  98.9 F (37.2 C)  TempSrc: Axillary Oral    SpO2: 95% 93%  97% 96%  Weight:      Height:        Intake/Output Summary (Last 24 hours) at 03/28/2024 0814 Last data filed at 03/28/2024 0600 Gross per 24 hour  Intake 240 ml  Output 2900 ml  Net -2660 ml   Filed Weights   03/26/24 0341 03/27/24 0059 03/27/24 0310  Weight: 65.1 kg 65.1 kg 65.2 kg    Examination:  General: Somnolent but arousable to person, appears chronically ill but in no acute distress HEENT:  Normocephalic atraumatic.  Sclerae nonicteric, noninjected.  Extraocular movements intact bilaterally. Neck:  Without mass or deformity.  Trachea is midline. Lungs: Diffuse rhonchi without overt rales or wheeze. Heart:  Regular rate and rhythm.  Without murmurs, rubs, or gallops. Abdomen:  Soft, nontender, nondistended.  Without guarding or rebound. Extremities: Without cyanosis, clubbing, edema, or obvious deformity. Skin:  Warm and dry, no erythema.   Data Reviewed: I have personally reviewed following labs and imaging studies  CBC: Recent Labs  Lab 03/23/24 1850 03/24/24 0637 03/25/24 0450  WBC 9.2 7.8 7.8  NEUTROABS 7.6  --   --   HGB 7.2* 7.0* 8.1*  HCT 22.7* 22.4* 25.3*  MCV 102.3* 101.8* 97.3  PLT 459* 424* 375   Basic Metabolic Panel: Recent Labs  Lab 03/23/24 1850 03/24/24 0637 03/25/24 0450  NA 123* 125* 127*  K 5.3* 5.2* 4.4  CL 83* 84* 87*  CO2 18* 20* 25  GLUCOSE 93 99 98  BUN 93* 93* 53*  CREATININE 5.80* 5.88* 3.88*  CALCIUM  7.7* 7.6* 7.8*   GFR: Estimated Creatinine Clearance: 14.4 mL/min (A) (by C-G formula based on SCr of 3.88 mg/dL (H)). Liver Function Tests: Recent Labs  Lab 03/23/24 1850  AST 17  ALT 31  ALKPHOS 138*  BILITOT 0.4  PROT 7.2  ALBUMIN  3.8   No results for input(s): LIPASE, AMYLASE in the last 168 hours. No results for input(s): AMMONIA in the last 168 hours. Coagulation Profile: No results for input(s): INR, PROTIME in the last 168 hours. Cardiac Enzymes: No results for input(s): CKTOTAL, CKMB,  CKMBINDEX, TROPONINI in the last 168 hours. BNP (last 3 results) Recent Labs    03/10/24 0055 03/13/24 2100 03/23/24 1850  PROBNP >35,000.0* >35,000.0* >35,000.0*   HbA1C: No results for input(s): HGBA1C in the last 72 hours. CBG: No results for input(s): GLUCAP in the last 168 hours. Lipid Profile: No results for input(s): CHOL, HDL, LDLCALC, TRIG, CHOLHDL, LDLDIRECT in the last 72 hours. Thyroid Function Tests: No results for input(s): TSH, T4TOTAL, FREET4, T3FREE, THYROIDAB in the last 72 hours. Anemia Panel: Recent Labs    03/27/24 1646  FERRITIN 633*  TIBC 281  IRON  54   Sepsis Labs: No results for input(s): PROCALCITON, LATICACIDVEN in the last 168 hours.  Recent Results (from the past 240 hours)  Resp panel by RT-PCR (RSV, Flu A&B, Covid) Anterior Nasal Swab     Status: None   Collection Time: 03/24/24  1:50 AM   Specimen: Anterior Nasal Swab  Result Value Ref Range Status   SARS Coronavirus 2 by RT PCR NEGATIVE NEGATIVE Final   Influenza A by PCR NEGATIVE NEGATIVE Final   Influenza B by PCR NEGATIVE NEGATIVE Final    Comment: (NOTE) The Xpert Xpress SARS-CoV-2/FLU/RSV plus assay is intended as an aid in the diagnosis of influenza from Nasopharyngeal swab  specimens and should not be used as a sole basis for treatment. Nasal washings and aspirates are unacceptable for Xpert Xpress SARS-CoV-2/FLU/RSV testing.  Fact Sheet for Patients: bloggercourse.com  Fact Sheet for Healthcare Providers: seriousbroker.it  This test is not yet approved or cleared by the [place]  FDA and has been authorized for detection and/or diagnosis of SARS-CoV-2 by FDA under an Emergency Use Authorization (EUA). This EUA will remain in effect (meaning this test can be used) for the duration of the COVID-19 declaration under Section 564(b)(1) of the Act, 21 U.S.C. section 360bbb-3(b)(1), unless the  authorization is terminated or revoked.     Resp Syncytial Virus by PCR NEGATIVE NEGATIVE Final    Comment: (NOTE) Fact Sheet for Patients: bloggercourse.com  Fact Sheet for Healthcare Providers: seriousbroker.it  This test is not yet approved or cleared by the [place]  FDA and has been authorized for detection and/or diagnosis of SARS-CoV-2 by FDA under an Emergency Use Authorization (EUA). This EUA will remain in effect (meaning this test can be used) for the duration of the COVID-19 declaration under Section 564(b)(1) of the Act, 21 U.S.C. section 360bbb-3(b)(1), unless the authorization is terminated or revoked.  Performed at [facility], [address] [place], [place] [address]   MRSA Next Gen by PCR, Nasal     Status: None   Collection Time: 03/26/24  9:03 AM   Specimen: Nasal Mucosa; Nasal Swab  Result Value Ref Range Status   MRSA by PCR Next Gen NOT DETECTED NOT DETECTED Final    Comment: (NOTE) The GeneXpert MRSA Assay (FDA approved for NASAL specimens only), is one component of a comprehensive MRSA colonization surveillance program. It is not intended to diagnose MRSA infection nor to guide or monitor treatment for MRSA infections. Test performance is not FDA approved in patients less than 36 years old. Performed at [facility], [address], [place], [place] [address]          Radiology Studies: IR Paracentesis Result Date: 03/26/2024 INDICATION: 64 year old female with history of HFpEF/ESRD with volume overload. She has previously undergone paracentesis (last puncture 01/02/24, 2.8 L output), and increased ascites was noted in US  performed during this admission. IR was requested for therapeutic paracentesis. EXAM: ULTRASOUND GUIDED THERAPEUTIC PARACENTESIS MEDICATIONS: 8 mL 1% lidocaine  with epinephrine  COMPLICATIONS: None immediate. PROCEDURE: Informed written consent was obtained from  the patient after a discussion of the risks, benefits and alternatives to treatment. A timeout was performed prior to the initiation of the procedure. Initial ultrasound scanning demonstrates a small to moderate amount of ascites within the right lower abdominal quadrant and moderate amount of ascites left lower quadrant achieved by positioning patient in LPO. The left lower abdomen was prepped and draped in the usual sterile fashion. 1% lidocaine  with epinephrine  was used for local anesthesia. Following this, a 19 gauge, 7-cm, Yueh catheter was introduced. An ultrasound image was saved for documentation purposes. The paracentesis was performed. The catheter was removed and a dressing was applied. Of note, patient was not fully cooperative during procedure, required constant direction to not change positions. Ideally, patient would have remained in a LPO position throughout the procedure to maximize output, but she was unable to do this, contributing to modest output volume. The patient tolerated the procedure well without immediate post procedural complication. FINDINGS: A total of approximately 675 milliliters of clear, yellow fluid was removed. IMPRESSION: Successful ultrasound-guided paracentesis yielding 675 milliliters of peritoneal fluid. Performed by Laymon Coast, NP under the  supervision of Dr. Ester Sides. Electronically Signed   By: Ester Sides M.D.   On: 03/26/2024 14:35        Scheduled Meds:  sodium chloride    Intravenous Once   amLODipine   10 mg Oral Daily   atorvastatin   10 mg Oral Daily   budesonide -glycopyrrolate -formoterol   2 puff Inhalation BID   busPIRone   5 mg Oral TID   calcitRIOL   0.25 mcg Oral Daily   carvedilol   6.25 mg Oral BID WC   Chlorhexidine  Gluconate Cloth  6 each Topical Q0600   Chlorhexidine  Gluconate Cloth  6 each Topical Q0600   folic acid   1 mg Oral Daily   gabapentin   100 mg Oral TID   heparin  injection (subcutaneous)  5,000 Units Subcutaneous Q8H    LORazepam   0-4 mg Intravenous Q6H   Followed by   NOREEN ON 03/29/2024] LORazepam   0-4 mg Intravenous Q12H   multivitamin with minerals  1 tablet Oral Daily   pantoprazole   40 mg Oral BID   simethicone   80 mg Oral QID   thiamine   100 mg Oral Daily   Or   thiamine   100 mg Intravenous Daily   torsemide   100 mg Oral Daily   Continuous Infusions:   LOS: 4 days   Time spent:  Elsie JAYSON Mont[place], DO [facility]  If 7PM-7AM, please contact night-coverage [email or web address]  03/28/2024, 8:14 AM      "

## 2024-03-28 NOTE — Progress Notes (Signed)
 RT note. Patient placed on bipap at this time due to labored breathing and unable to follow commands at this time. Recent bipap order placed for patient. Patient on current settings sat 95%. Sitter in patients room at this time, patient was suctioned out before placing bipap on.  RT will continue to monitor.    03/28/24 0911  BiPAP/CPAP/SIPAP  $ Non-Invasive Home Ventilator  Initial  $ Face Mask Small Yes  BiPAP/CPAP/SIPAP Pt Type Adult  BiPAP/CPAP/SIPAP DREAMSTATIOND  Mask Type Full face mask  Dentures removed? Yes - Placed in denture cup  Mask Size Small  Respiratory Rate 22 breaths/min  IPAP 12 cmH20  EPAP 5 cmH2O  Flow Rate 3 lpm  Patient Home Machine No  Patient Home Mask No  Patient Home Tubing No  Auto Titrate No  CPAP/SIPAP surface wiped down Yes  Device Plugged into RED Power Outlet Yes  BiPAP/CPAP /SiPAP Vitals  Pulse Rate 94  Resp (!) 22  SpO2 95 %

## 2024-03-28 NOTE — Progress Notes (Signed)
" °   03/28/24 0335  Vitals  Temp 97.9 F (36.6 C)  Temp Source Axillary  BP (!) 150/83  MAP (mmHg) 103  BP Location Left Arm  BP Method Automatic  Patient Position (if appropriate) Lying  Pulse Rate Source Monitor  ECG Heart Rate 90  Oxygen  Therapy  SpO2 95 %  O2 Device Nasal Cannula  O2 Flow Rate (L/min) 4 L/min  During Treatment Monitoring  Blood Flow Rate (mL/min) 330 mL/min  Arterial Pressure (mmHg) -170 mmHg  Venous Pressure (mmHg) 180 mmHg  TMP (mmHg) 20 mmHg  Ultrafiltration Rate (mL/min) 1268 mL/min  Dialysate Flow Rate (mL/min) 300 ml/min  Dialysate Potassium Concentration 3  Dialysate Calcium  Concentration 2.5  Cumulative Fluid Removed (mL) per Treatment  2900  HD Safety Checks Performed Yes  Intra-Hemodialysis Comments Tx completed  Post Treatment  Dialyzer Clearance Lightly streaked  Liters Processed 65.9  Fluid Removed (mL) 2900 mL  Tolerated HD Treatment Yes  AVG/AVF Arterial Site Held (minutes) 10 minutes  AVG/AVF Venous Site Held (minutes) 10 minutes  Fistula / Graft Right Upper arm Arteriovenous fistula  Placement Date/Time: 08/14/22 1230   Orientation: Right  Access Location: (c) Upper arm  Access Type: Arteriovenous fistula  Site Condition No complications  Fistula / Graft Assessment Present;Thrill;Bruit  Status Deaccessed  Needle Size 15  Drainage Description None   Pt tolerated treatment without complications.2.9 liters fluid removal. "

## 2024-03-28 NOTE — Progress Notes (Signed)
 RT note. Patient placed back on [place] 3L at this time per MD.   03/28/24 0949  Therapy Vitals  Pulse Rate 95  Resp 18  MEWS Score/Color  MEWS Score 0  MEWS Score Color Green  Oxygen  Therapy/Pulse Ox  O2 Device (S)  Nasal Cannula  SpO2 92 %  O2 Therapy Oxygen   O2 Flow Rate (L/min) 2 L/min

## 2024-03-28 NOTE — Significant Event (Signed)
 Rapid Response Event Note   Reason for Call :  Decreased LOC and Bipap  Initial Focused Assessment/Interventions: :  Patient is drowsy but arousable.  She is warm and dry.  Lung sounds with rhonchi, mildly labored. RT placing patient on Bipap. NT suctioned  BP 168/88  HR 83  RR 18  O2 sat 95% on [place]  Patient was started on CIWA/ativan  treatment yesterday.  Able to wean off bipap, placed on [place] Patient restless with minor stimulation   Plan of Care:  Continuous pulse ox Continue CIWA treatment, safety sitter RN to call if patient becomes somnolent again.   Event Summary:   MD Notified: Lue, came to bedside Call Time: 0855 Arrival Time: 0900 End Time: 1000  Elvin [place], RN

## 2024-03-29 LAB — COMPREHENSIVE METABOLIC PANEL WITH GFR
ALT: 16 U/L (ref 0–44)
AST: 16 U/L (ref 15–41)
Albumin: 3.5 g/dL (ref 3.5–5.0)
Alkaline Phosphatase: 111 U/L (ref 38–126)
Anion gap: 12 (ref 5–15)
BUN: 36 mg/dL — ABNORMAL HIGH (ref 8–23)
CO2: 27 mmol/L (ref 22–32)
Calcium: 8.1 mg/dL — ABNORMAL LOW (ref 8.9–10.3)
Chloride: 90 mmol/L — ABNORMAL LOW (ref 98–111)
Creatinine, Ser: 3.58 mg/dL — ABNORMAL HIGH (ref 0.44–1.00)
GFR, Estimated: 14 mL/min — ABNORMAL LOW
Glucose, Bld: 95 mg/dL (ref 70–99)
Potassium: 4.1 mmol/L (ref 3.5–5.1)
Sodium: 129 mmol/L — ABNORMAL LOW (ref 135–145)
Total Bilirubin: 0.3 mg/dL (ref 0.0–1.2)
Total Protein: 6.4 g/dL — ABNORMAL LOW (ref 6.5–8.1)

## 2024-03-29 LAB — PHOSPHORUS: Phosphorus: 5.9 mg/dL — ABNORMAL HIGH (ref 2.5–4.6)

## 2024-03-29 MED ORDER — ORAL CARE MOUTH RINSE: 15.0000 mL | OROMUCOSAL | Status: DC | PRN

## 2024-03-29 MED ORDER — ALTEPLASE 2 MG IJ SOLR: 2.0000 mg | Freq: Once | INTRAMUSCULAR | Status: DC | PRN

## 2024-03-29 MED ORDER — IRON SUCROSE 200 MG IVPB - SIMPLE MED
200.0000 mg | Status: AC
  Administered 2024-03-29 – 2024-03-31 (×2): 200 mg via INTRAVENOUS
  Filled 2024-03-29 (×2): qty 200

## 2024-03-29 MED ORDER — PENTAFLUOROPROP-TETRAFLUOROETH EX AERO: 1.0000 | INHALATION_SPRAY | CUTANEOUS | Status: DC | PRN

## 2024-03-29 MED ORDER — DARBEPOETIN ALFA 60 MCG/0.3ML IJ SOSY
60.0000 ug | PREFILLED_SYRINGE | INTRAMUSCULAR | Status: DC
  Administered 2024-03-31: 60 ug via SUBCUTANEOUS
  Filled 2024-03-29 (×2): qty 0.3

## 2024-03-29 MED ORDER — LORAZEPAM 1 MG PO TABS
ORAL_TABLET | ORAL | Status: AC
  Filled 2024-03-29: qty 1

## 2024-03-29 MED ORDER — LIDOCAINE-PRILOCAINE 2.5-2.5 % EX CREA: 1.0000 | TOPICAL_CREAM | CUTANEOUS | Status: DC | PRN

## 2024-03-29 MED ORDER — LIDOCAINE HCL (PF) 1 % IJ SOLN: 5.0000 mL | INTRAMUSCULAR | Status: DC | PRN

## 2024-03-29 MED ORDER — ANTICOAGULANT SODIUM CITRATE 4% (200MG/5ML) IV SOLN: 5.0000 mL | Status: DC | PRN

## 2024-03-29 MED ORDER — HEPARIN SODIUM (PORCINE) 1000 UNIT/ML DIALYSIS: 1000.0000 [IU] | INTRAMUSCULAR | Status: DC | PRN

## 2024-03-29 NOTE — Care Management Important Message (Signed)
 Important Message  Patient Details  Name: [name] MRN: [id number] Date of Birth: 12-19-1960   Important Message Given:  Yes - Medicare IM     Claretta Deed 03/29/2024, 3:51 PM

## 2024-03-29 NOTE — Consult Note (Signed)
 WOC Nurse Consult Note: Reason for Consult: skin tears  Wound type: partial thickness skin tears d/t trauma/hitting arms on side rails  Pressure Injury POA: na not pressure Measurement: see nursing flowsheet  Wound bed: red moist  Drainage (amount, consistency, odor) serosanguinous  Periwound: ecchymosis  Dressing procedure/placement/frequency: Cleanse all skin tears with NS, apply Xeroform gauze ([phone] [phone]) to wound beds daily and secure with silicone foam or Kerlix roll gauze whichever works best depending on location.  Soak Xeroform with normal saline if adhered to wound bed for atraumatic removal.   POC discussed with primary nurse. WOC team will not follow. Reconsult if further needs arise.   Thank you,    Powell Bar MSN, RN-BC, [employer]

## 2024-03-29 NOTE — Plan of Care (Signed)

## 2024-03-29 NOTE — Progress Notes (Signed)
 " PROGRESS NOTE    [name]  FMW:[id number] DOB: 03-07-1961 DOA: 03/23/2024 PCP: Pcp, No   Brief Narrative:  64 y.o. female with medical history significant of ESRD on HD with history of noncompliance due to homelessness, HFpEF, COPD, polysubstance abuse, hypertension, hyperlipidemia, GERD, anxiety, depression, anemia, hepatic cirrhosis presenting with complaints of chest pain and shortness of breath.  Patient is a poor historian.  She is reporting substernal chest heaviness and shortness of breath for several days.  She was admitted to the hospital last month for volume overload and has not gone for dialysis since she was discharged 2 weeks ago.  She reports taking all of her medications.  Assessment & Plan:   Principal Problem:   Volume overload Active Problems:   CHF exacerbation (HCC)   ESRD on dialysis (HCC)   Hyperkalemia   COPD (chronic obstructive pulmonary disease) (HCC)   Etoh abuse, rule out withdrawals Acute encephalopathy, improving - Continue on ciwa protocol - Much more awake alert and oriented this morning, still having waxing and waning mental status but ANO x 4 during my interview although still needs a sitter due to impulsive behavior and confusion subsequently in the afternoon  Acute hypoxemic respiratory failure secondary to volume overload -Significantly improved after hemodialysis ESRD with dialysis noncompliance due to homelessness Last echo done 10/23/2023 showing EF 60 to 65%, grade 2 diastolic dysfunction, left atrium moderately dilated, and mild mitral regurgitation Chest x-ray showing vascular congestion and small to moderate bilateral pleural effusions.  Nephrology following, appreciate insight and recommendations Continue home torsemide .   Continue supplemental oxygen , wean as tolerated -transient episode of respiratory distress this morning after initiating CIWA protocol, BiPAP placed but subsequently removed due to intolerance/agitation patient  improved on nasal cannula alone   Chest pain-resolved Troponin minimally elevated at intake, flat, not consistent with ACS -Echo noted global hypokinesis grade 1 diastolic dysfunction EF 50 to 44% -Symptom likely in setting of missed hemodialysis with worsening pulmonary edema and vascular congestion   Hepatic cirrhosis Alcohol  use/abuse as above -Likely chronic in the setting of alcohol  use and abuse Paracentesis with only 675 mL of clear yellow fluid obtained by IR, continue to follow   Chronic anemia At baseline, no signs or symptoms of bleeding  Hyponatremia Mild hyperkalemia Hypocalcemia Chronic mild metabolic acidosis Resolved -advance diet as tolerated, electrolytes per dialysis   COPD Without acute exacerbation, continue home inhalers, nebs, supplemental oxygen  as above Hypertension Continue amlodipine  and Coreg . Hyperlipidemia Continue Lipitor. GERD Continue Protonix . Anxiety/depression Continue BuSpar . Chronic pain/neuropathy Continue gabapentin . Homelessness TOC consulted.  DVT prophylaxis: heparin  injection 5,000 Units Start: 03/25/24 1400 SCDs Start: 03/24/24 9380 Code Status:   Code Status: Limited: Do not attempt resuscitation (DNR) -DNR-LIMITED -Do Not Intubate/DNI  Family Communication: None present  Status is: Inpatient  Dispo: The patient is from: Homeless              Anticipated d/c is to: To be determined              Anticipated d/c date is: To be determined              Patient currently not medically stable for discharge  Consultants:  Nephrology  Procedures:  Paracentesis  Antimicrobials:  None  Subjective: Overnight patient had worsening mental status concerning for alcohol  withdrawals, CIWA protocol initiated, this morning patient remains somnolent review of systems markedly limited  Objective: Vitals:   03/28/24 1735 03/28/24 2043 03/29/24 0002 03/29/24 0359  BP: 137/63 127/61 ROLLEN)  112/54 (!) 137/46  Pulse: 72 67 74 (!) 59  Resp:  18 19 18 18   Temp: 98.6 F (37 C) 97.6 F (36.4 C) 97.7 F (36.5 C) 97.6 F (36.4 C)  TempSrc:      SpO2: (!) 87% (!) 89% 98% 99%  Weight:      Height:        Intake/Output Summary (Last 24 hours) at 03/29/2024 0751 Last data filed at 03/29/2024 0600 Gross per 24 hour  Intake 600 ml  Output 0 ml  Net 600 ml   Filed Weights   03/26/24 0341 03/27/24 0059 03/27/24 0310  Weight: 65.1 kg 65.1 kg 65.2 kg    Examination:  General: Somnolent but arousable to person, appears chronically ill but in no acute distress HEENT:  Normocephalic atraumatic.  Sclerae nonicteric, noninjected.  Extraocular movements intact bilaterally. Neck:  Without mass or deformity.  Trachea is midline. Lungs: Diffuse rhonchi without overt rales or wheeze. Heart:  Regular rate and rhythm.  Without murmurs, rubs, or gallops. Abdomen:  Soft, nontender, nondistended.  Without guarding or rebound. Extremities: Without cyanosis, clubbing, edema, or obvious deformity. Skin:  Warm and dry, no erythema.   Data Reviewed: I have personally reviewed following labs and imaging studies  CBC: Recent Labs  Lab 03/23/24 1850 03/24/24 0637 03/25/24 0450 03/28/24 1103  WBC 9.2 7.8 7.8 5.9  NEUTROABS 7.6  --   --   --   HGB 7.2* 7.0* 8.1* 8.2*  HCT 22.7* 22.4* 25.3* 24.7*  MCV 102.3* 101.8* 97.3 93.9  PLT 459* 424* 375 331   Basic Metabolic Panel: Recent Labs  Lab 03/23/24 1850 03/24/24 0637 03/25/24 0450 03/28/24 1103 03/29/24 0622  NA 123* 125* 127* 126* 129*  K 5.3* 5.2* 4.4 4.1 4.1  CL 83* 84* 87* 88* 90*  CO2 18* 20* 25 25 27   GLUCOSE 93 99 98 85 95  BUN 93* 93* 53* 31* 36*  CREATININE 5.80* 5.88* 3.88* 3.03* 3.58*  CALCIUM  7.7* 7.6* 7.8* 7.9* 8.1*   GFR: Estimated Creatinine Clearance: 15.6 mL/min (A) (by C-G formula based on SCr of 3.58 mg/dL (H)). Liver Function Tests: Recent Labs  Lab 03/23/24 1850 03/29/24 0622  AST 17 16  ALT 31 16  ALKPHOS 138* 111  BILITOT 0.4 0.3  PROT 7.2 6.4*   ALBUMIN  3.8 3.5   No results for input(s): LIPASE, AMYLASE in the last 168 hours. No results for input(s): AMMONIA in the last 168 hours. Coagulation Profile: No results for input(s): INR, PROTIME in the last 168 hours. Cardiac Enzymes: No results for input(s): CKTOTAL, CKMB, CKMBINDEX, TROPONINI in the last 168 hours. BNP (last 3 results) Recent Labs    03/10/24 0055 03/13/24 2100 03/23/24 1850  PROBNP >35,000.0* >35,000.0* >35,000.0*   HbA1C: No results for input(s): HGBA1C in the last 72 hours. CBG: No results for input(s): GLUCAP in the last 168 hours. Lipid Profile: No results for input(s): CHOL, HDL, LDLCALC, TRIG, CHOLHDL, LDLDIRECT in the last 72 hours. Thyroid Function Tests: No results for input(s): TSH, T4TOTAL, FREET4, T3FREE, THYROIDAB in the last 72 hours. Anemia Panel: Recent Labs    03/27/24 1646  FERRITIN 633*  TIBC 281  IRON  54   Sepsis Labs: No results for input(s): PROCALCITON, LATICACIDVEN in the last 168 hours.  Recent Results (from the past 240 hours)  Resp panel by RT-PCR (RSV, Flu A&B, Covid) Anterior Nasal Swab     Status: None   Collection Time: 03/24/24  1:50 AM   Specimen: Anterior  Nasal Swab  Result Value Ref Range Status   SARS Coronavirus 2 by RT PCR NEGATIVE NEGATIVE Final   Influenza A by PCR NEGATIVE NEGATIVE Final   Influenza B by PCR NEGATIVE NEGATIVE Final    Comment: (NOTE) The Xpert Xpress SARS-CoV-2/FLU/RSV plus assay is intended as an aid in the diagnosis of influenza from Nasopharyngeal swab specimens and should not be used as a sole basis for treatment. Nasal washings and aspirates are unacceptable for Xpert Xpress SARS-CoV-2/FLU/RSV testing.  Fact Sheet for Patients: bloggercourse.com  Fact Sheet for Healthcare Providers: seriousbroker.it  This test is not yet approved or cleared by the [place]  FDA and has been  authorized for detection and/or diagnosis of SARS-CoV-2 by FDA under an Emergency Use Authorization (EUA). This EUA will remain in effect (meaning this test can be used) for the duration of the COVID-19 declaration under Section 564(b)(1) of the Act, 21 U.S.C. section 360bbb-3(b)(1), unless the authorization is terminated or revoked.     Resp Syncytial Virus by PCR NEGATIVE NEGATIVE Final    Comment: (NOTE) Fact Sheet for Patients: bloggercourse.com  Fact Sheet for Healthcare Providers: seriousbroker.it  This test is not yet approved or cleared by the [place]  FDA and has been authorized for detection and/or diagnosis of SARS-CoV-2 by FDA under an Emergency Use Authorization (EUA). This EUA will remain in effect (meaning this test can be used) for the duration of the COVID-19 declaration under Section 564(b)(1) of the Act, 21 U.S.C. section 360bbb-3(b)(1), unless the authorization is terminated or revoked.  Performed at [facility], [address], [place], [place] [address]   MRSA Next Gen by PCR, Nasal     Status: None   Collection Time: 03/26/24  9:03 AM   Specimen: Nasal Mucosa; Nasal Swab  Result Value Ref Range Status   MRSA by PCR Next Gen NOT DETECTED NOT DETECTED Final    Comment: (NOTE) The GeneXpert MRSA Assay (FDA approved for NASAL specimens only), is one component of a comprehensive MRSA colonization surveillance program. It is not intended to diagnose MRSA infection nor to guide or monitor treatment for MRSA infections. Test performance is not FDA approved in patients less than 37 years old. Performed at [facility], [address] [place], [place] [address]          Radiology Studies: No results found.       Scheduled Meds:  sodium chloride    Intravenous Once   amLODipine   10 mg Oral Daily   atorvastatin   10 mg Oral Daily   budesonide -glycopyrrolate -formoterol   2 puff  Inhalation BID   busPIRone   5 mg Oral TID   calcitRIOL   0.25 mcg Oral Daily   carvedilol   6.25 mg Oral BID WC   Chlorhexidine  Gluconate Cloth  6 each Topical Q0600   folic acid   1 mg Oral Daily   gabapentin   100 mg Oral TID   heparin  injection (subcutaneous)  5,000 Units Subcutaneous Q8H   LORazepam   0-4 mg Intravenous Q6H   Followed by   LORazepam   0-4 mg Intravenous Q12H   multivitamin with minerals  1 tablet Oral Daily   pantoprazole   40 mg Oral BID   thiamine   100 mg Oral Daily   Or   thiamine   100 mg Intravenous Daily   torsemide   100 mg Oral Daily   Continuous Infusions:   LOS: 5 days   Time spent:  Elsie JAYSON Montclair, DO [facility]  If 7PM-7AM, please contact night-coverage  [email or web address]  03/29/2024, 7:51 AM      "

## 2024-03-29 NOTE — Progress Notes (Signed)
" °   03/29/24 1740  Vitals  Temp 97.7 F (36.5 C)  Pulse Rate 82  Resp (!) 22  BP (!) 153/77  SpO2 100 %  O2 Device Nasal Cannula  Weight 63.8 kg  Type of Weight Post-Dialysis  Oxygen  Therapy  O2 Flow Rate (L/min) 4 L/min  Patient Activity (if Appropriate) In bed  Pulse Oximetry Type Continuous  Oximetry Probe Site Changed No  Post Treatment  Dialyzer Clearance Lightly streaked  Hemodialysis Intake (mL) 0 mL  Liters Processed 65.5  Fluid Removed (mL) 3000 mL  Tolerated HD Treatment Yes  AVG/AVF Arterial Site Held (minutes) 10 minutes  AVG/AVF Venous Site Held (minutes) 10 minutes   Received patient in bed to unit.  Alert and oriented.  Informed consent signed and in chart.   TX duration:3.15  Patient tolerated well.  Transported back to the room  Alert, without acute distress.  Hand-off given to patient's nurse.   Access used: RUAF Access issues: no complications  Total UF removed: 3000 Medication(s) given: ativan  1mg  po x 1   Delon LITTIE [facility] "

## 2024-03-29 NOTE — Progress Notes (Signed)
 " [facility] Progress Note    Assessment/ Plan:   1. Volume overload - CXR on 1/6 with vascular congestion, pleural effusion and atelectasis.  +hypoxia on 3L O2. +Ascites. HD earlier this week with net UF 2.9L. Unable to dialyze Friday night due to agitation.             - had HD last night (Sat) w/ 2.9 L UF - UF as tolerated   2. Ascites  - s/p paracentesis with net UF .  3. ESRD - Does not have regular HD center. Had HD here on Wed and Sat night. Next HD today  -renal navigator following 4. Anemia of CKD- Hgb 8.2, will start Venofer  x 2, ESA start 1/14 5. Secondary hyperparathyroidism - Ca a little low. Calcitriol  started.  Using increased Ca bath.  Check phosphorus.  6. HTN/volume - Blood pressure elevated, cont to lower volume w/ HD as tolerated 7. Nutrition - On regular diet, monitor labs.  Added fluid restrictions . 8. COPD - continue home meds.   OP Dialysis Orders: from 2024 -->  3h  B350 Heparin  none  RUA AVF No dialysis center currently   Subjective:   Patient seen in room, no complaints/acute events   Objective:   BP (!) 145/63 (BP Location: Left Arm)   Pulse 66   Temp 98.4 F (36.9 C) (Oral)   Resp 17   Ht 5' 7 (1.702 m)   Wt 65.2 kg   SpO2 92%   BMI 22.51 kg/m   Intake/Output Summary (Last 24 hours) at 03/29/2024 1102 Last data filed at 03/29/2024 0848 Gross per 24 hour  Intake 718 ml  Output 0 ml  Net 718 ml   Weight change:   Physical Exam: Gen: NAD CVS: RRR Resp: unlabored, normal wob Abd: distended Ext: +edema b/l Les Neuro: awake, alert Dialysis access: RUE AVF +b/t  Imaging: No results found.  Labs: BMET Recent Labs  Lab 03/23/24 1850 03/24/24 0637 03/25/24 0450 03/28/24 1103 03/29/24 0622  NA 123* 125* 127* 126* 129*  K 5.3* 5.2* 4.4 4.1 4.1  CL 83* 84* 87* 88* 90*  CO2 18* 20* 25 25 27   GLUCOSE 93 99 98 85 95  BUN 93* 93* 53* 31* 36*  CREATININE 5.80* 5.88* 3.88* 3.03* 3.58*  CALCIUM  7.7*  7.6* 7.8* 7.9* 8.1*   CBC Recent Labs  Lab 03/23/24 1850 03/24/24 0637 03/25/24 0450 03/28/24 1103  WBC 9.2 7.8 7.8 5.9  NEUTROABS 7.6  --   --   --   HGB 7.2* 7.0* 8.1* 8.2*  HCT 22.7* 22.4* 25.3* 24.7*  MCV 102.3* 101.8* 97.3 93.9  PLT 459* 424* 375 331    Medications:     sodium chloride    Intravenous Once   amLODipine   10 mg Oral Daily   atorvastatin   10 mg Oral Daily   budesonide -glycopyrrolate -formoterol   2 puff Inhalation BID   busPIRone   5 mg Oral TID   calcitRIOL   0.25 mcg Oral Daily   carvedilol   6.25 mg Oral BID WC   Chlorhexidine  Gluconate Cloth  6 each Topical Q0600   folic acid   1 mg Oral Daily   gabapentin   100 mg Oral TID   heparin  injection (subcutaneous)  5,000 Units Subcutaneous Q8H   LORazepam   0-4 mg Intravenous Q6H   Followed by   LORazepam   0-4 mg Intravenous Q12H   multivitamin with minerals  1 tablet Oral Daily   pantoprazole   40 mg Oral BID   thiamine   100 mg Oral Daily   Or   thiamine   100 mg Intravenous Daily   torsemide   100 mg Oral Daily      Ephriam Stank, MD [facility] 03/29/2024, 11:02 AM   "

## 2024-03-29 NOTE — TOC CAGE-AID Note (Signed)
 Transition of Care [facility]) - CAGE-AID Screening   Patient Details  Name: [name] MRN: [id number] Date of Birth: [birth year]  [facility] CM/SW Contact:    Lendia Dais, LCSWA Phone Number: 03/29/2024, 12:33 PM   Clinical Narrative: CSW spoke to the pt at bedside and introduced self and role. CSW informed the pt of the consult placed for substance use and pt was agreeable to the assessment.  Pt denied alcohol  use and stated that they use cocaine once a week. CSW offered resources and the pt was agreeable.  Resources placed in AVS.    CAGE-AID Screening:    Have You Ever Felt You Ought to Cut Down on Your Drinking or Drug Use?: Yes Have People Annoyed You By Critizing Your Drinking Or Drug Use?: No Have You Felt Bad Or Guilty About Your Drinking Or Drug Use?: No Have You Ever Had a Drink or Used Drugs First Thing In The Morning to Steady Your Nerves or to Get Rid of a Hangover?: Yes CAGE-AID Score: 2  Substance Abuse Education Offered: Yes  Substance abuse interventions: Transport Planner, SDOH Screening

## 2024-03-30 ENCOUNTER — Inpatient Hospital Stay (HOSPITAL_COMMUNITY)

## 2024-03-30 LAB — BLOOD GAS, VENOUS
Acid-base deficit: 1.9 mmol/L (ref 0.0–2.0)
Bicarbonate: 29.3 mmol/L — ABNORMAL HIGH (ref 20.0–28.0)
O2 Saturation: 53.1 %
Patient temperature: 36.5
pCO2, Ven: 104 mmHg (ref 44–60)
pH, Ven: 7.06 — CL (ref 7.25–7.43)
pO2, Ven: 32 mmHg (ref 32–45)

## 2024-03-30 MED ORDER — CHLORHEXIDINE GLUCONATE CLOTH 2 % EX PADS
6.0000 | MEDICATED_PAD | Freq: Every day | CUTANEOUS | Status: DC
  Administered 2024-03-30 – 2024-04-14 (×13): 6 via TOPICAL

## 2024-03-30 MED ORDER — ZIPRASIDONE HCL 20 MG PO CAPS
20.0000 mg | ORAL_CAPSULE | Freq: Two times a day (BID) | ORAL | Status: DC
  Administered 2024-03-30 (×2): 20 mg via ORAL
  Filled 2024-03-30 (×4): qty 1

## 2024-03-30 NOTE — Progress Notes (Signed)
 Date and time results received: 03/30/2024 2120  Reported by: Claris, Lab  Reported to: Richardean, RN  Test: pC02 Critical Value: 104  Name of Provider Notified: Opyd, T  Orders Received? Or Actions Taken?: MD notified, awaiting orders

## 2024-03-30 NOTE — Progress Notes (Signed)
" °   03/30/24 2200  BiPAP/CPAP/SIPAP  $ Non-Invasive Ventilator  Non-Invasive Vent Subsequent  $ Non-Invasive Home Ventilator  Initial  $ Face Mask Medium Yes  BiPAP/CPAP/SIPAP Pt Type Adult  BiPAP/CPAP/SIPAP SERVO  Mask Type Full face mask  Dentures removed? Yes - Placed in denture cup  Set Rate (S)  30 breaths/min  Respiratory Rate 32 breaths/min  IPAP 18 cmH20  EPAP 8 cmH2O  PEEP 8 cmH20  FiO2 (%) 60 % (Weaned per sats from 100% NRB)  Minute Ventilation 18  Leak 42  Peak Inspiratory Pressure (PIP) 18  Tidal Volume (Vt) 320  Patient Home Machine No  Press High Alarm 25 cmH2O  Press Low Alarm 5 cmH2O  BiPAP/CPAP /SiPAP Vitals  Pulse Rate (!) 52  Resp (!) 32  SpO2 100 %  MEWS Score/Color  MEWS Score 2  MEWS Score Color Yellow     Pt placed on BIPAP 18/10 on 60% per MD order, obtunded with Rapid at bedside due to elevated CO2. Pt placed on a rate of 30 and oxygen  weaned from NRB 100% to 60% on BIPAP.  Pt then transported on BIPAP without incident with RN to 3E18. Report given to RT, RT will monitor. "

## 2024-03-30 NOTE — Evaluation (Signed)
 Clinical/Bedside Swallow Evaluation Patient Details  Name: MONIE SHERE MRN: [id number] Date of Birth: 01-03-1961  Today's Date: 03/30/2024 Time: SLP Start Time (ACUTE ONLY): 1346 SLP Stop Time (ACUTE ONLY): 1408 SLP Time Calculation (min) (ACUTE ONLY): 22 min  Past Medical History:  Past Medical History:  Diagnosis Date   Acute on chronic blood loss anemia 08/01/2022   Acute on chronic diastolic CHF (congestive heart failure) (HCC) 08/12/2022   Acute on chronic respiratory failure with hypoxia (HCC) 05/16/2023   Acute pulmonary edema (HCC) 05/16/2023   Acute seasonal allergic rhinitis due to pollen 02/22/2016   Alcohol  abuse    Allergy    Anxiety    Arteriovenous fistula infection, initial encounter 04/16/2023   Arthritis    Asthma    Cannabis abuse    Cocaine abuse (HCC)    COPD (chronic obstructive pulmonary disease) (HCC)    COPD with acute exacerbation (HCC) 10/25/2022   COVID 05/16/2023   Depression    Drug addiction (HCC)    GERD (gastroesophageal reflux disease)    GIB (gastrointestinal bleeding) 08/19/2019   Heart murmur    Heme positive stool 08/14/2022   Homelessness    Hx of adenomatous colonic polyps    Hyperlipidemia    Hypertension    pt stated every once in a while BP will be high but has not been prescribed medication for HTN.    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) 02/22/2016   SIRS (systemic inflammatory response syndrome) (HCC) 10/25/2022   Past Surgical History:  Past Surgical History:  Procedure Laterality Date   A/V FISTULAGRAM Right 01/31/2023   Procedure: A/V Fistulagram;  Surgeon: Gretta Lonni PARAS, MD;  Location: [facility];  Service: Cardiovascular;   Laterality: Right;   AV FISTULA PLACEMENT Right 08/14/2022   Procedure: RIGHT ARM BRACHIOBASILIC ATERIOVENOUS FISTULA CREATION;  Surgeon: Magda Debby SAILOR, MD;  Location: [facility] OR;  Service: Vascular;  Laterality: Right;   BASCILIC VEIN TRANSPOSITION Right 11/13/2022   Procedure: RIGHT ARM SECOND STAGE BASILIC VEIN TRANSPOSITION;  Surgeon: Magda Debby SAILOR, MD;  Location: [facility] OR;  Service: Vascular;  Laterality: Right;   BIOPSY  01/01/2019   Procedure: BIOPSY;  Surgeon: Albertus Gordy CHRISTELLA, MD;  Location: [facility] ENDOSCOPY;  Service: Endoscopy;;   BIOPSY  11/17/2021   Procedure: BIOPSY;  Surgeon: Charlanne Groom, MD;  Location: [facility] ENDOSCOPY;  Service: Gastroenterology;;   CESAREAN SECTION  1989   COLONOSCOPY  11/07/2020   2018   CYSTOSCOPY W/ URETERAL STENT PLACEMENT Left 08/01/2018   Procedure: CYSTOSCOPY WITH RETROGRADE PYELOGRAM/URETERAL STENT PLACEMENT;  Surgeon: Carolee Sherwood JONETTA DOUGLAS, MD;  Location: [facility] ORS;  Service: Urology;  Laterality: Left;   CYSTOSCOPY WITH RETROGRADE PYELOGRAM, URETEROSCOPY AND STENT PLACEMENT Left 01/15/2019   Procedure: CYSTOSCOPY WITH RETROGRADE PYELOGRAM, URETEROSCOPY AND STENT PLACEMENT;  Surgeon: Carolee Sherwood JONETTA DOUGLAS, MD;  Location: [facility] ORS;  Service: Urology;  Laterality: Left;   ENTEROSCOPY N/A 08/16/2022   Procedure: ENTEROSCOPY;  Surgeon: Wilhelmenia Aloha Raddle., MD;  Location: [facility] ENDOSCOPY;  Service: Gastroenterology;  Laterality: N/A;   ENTEROSCOPY N/A 10/31/2022   Procedure: ENTEROSCOPY;  Surgeon: Albertus Gordy CHRISTELLA, MD;  Location: [facility] ENDOSCOPY;  Service: Gastroenterology;  Laterality: N/A;   ENTEROSCOPY N/A 05/09/2023  Procedure: ENTEROSCOPY;  Surgeon: Federico Rosario BROCKS, MD;  Location: [facility] ENDOSCOPY;  Service: Gastroenterology;  Laterality: N/A;   ENTEROSCOPY N/A 09/09/2023   Procedure: ENTEROSCOPY;  Surgeon: Federico Rosario BROCKS, MD;  Location: [facility] ENDOSCOPY;  Service: Gastroenterology;  Laterality: N/A;   ENTEROSCOPY N/A 10/26/2023   Procedure: ENTEROSCOPY;  Surgeon: Albertus Gordy HERO, MD;   Location: [facility] ENDOSCOPY;  Service: Gastroenterology;  Laterality: N/A;   ESOPHAGOGASTRODUODENOSCOPY (EGD) WITH PROPOFOL  N/A 01/01/2019   Procedure: ESOPHAGOGASTRODUODENOSCOPY (EGD) WITH PROPOFOL ;  Surgeon: Albertus Gordy HERO, MD;  Location: [facility] ENDOSCOPY;  Service: Endoscopy;  Laterality: N/A;   ESOPHAGOGASTRODUODENOSCOPY (EGD) WITH PROPOFOL  N/A 08/21/2019   Procedure: ESOPHAGOGASTRODUODENOSCOPY (EGD) WITH PROPOFOL ;  Surgeon: Shila Gustav GAILS, MD;  Location: [facility] ENDOSCOPY;  Service: Endoscopy;  Laterality: N/A;   ESOPHAGOGASTRODUODENOSCOPY (EGD) WITH PROPOFOL  N/A 11/17/2021   Procedure: ESOPHAGOGASTRODUODENOSCOPY (EGD) WITH PROPOFOL ;  Surgeon: Charlanne Groom, MD;  Location: [facility] ENDOSCOPY;  Service: Gastroenterology;  Laterality: N/A;   FRACTURE SURGERY Left 2011   arm   HEMOSTASIS CLIP PLACEMENT  08/16/2022   Procedure: HEMOSTASIS CLIP PLACEMENT;  Surgeon: Wilhelmenia Aloha Raddle., MD;  Location: [facility] ENDOSCOPY;  Service: Gastroenterology;;   HOT HEMOSTASIS N/A 08/16/2022   Procedure: HOT HEMOSTASIS (ARGON PLASMA COAGULATION/BICAP);  Surgeon: Wilhelmenia Aloha Raddle., MD;  Location: [facility] ENDOSCOPY;  Service: Gastroenterology;  Laterality: N/A;   HOT HEMOSTASIS N/A 10/31/2022   Procedure: HOT HEMOSTASIS (ARGON PLASMA COAGULATION/BICAP);  Surgeon: Albertus Gordy HERO, MD;  Location: [facility] ENDOSCOPY;  Service: Gastroenterology;  Laterality: N/A;   HOT HEMOSTASIS N/A 05/09/2023   Procedure: HOT HEMOSTASIS (ARGON PLASMA COAGULATION/BICAP);  Surgeon: Federico Rosario BROCKS, MD;  Location: [facility] ENDOSCOPY;  Service: Gastroenterology;  Laterality: N/A;   HOT HEMOSTASIS N/A 09/09/2023   Procedure: EGD, WITH ARGON PLASMA COAGULATION;  Surgeon: Federico Rosario BROCKS, MD;  Location: [facility] ENDOSCOPY;  Service: Gastroenterology;  Laterality: N/A;   INSERTION OF DIALYSIS CATHETER Right 08/14/2022   Procedure: INSERTION OF TUNNELED DIALYSIS CATHETER USING PALINDROME CATHETER KIT 19CM;  Surgeon: Magda Debby SAILOR, MD;  Location: [facility] OR;  Service: Vascular;   Laterality: Right;   IR PARACENTESIS  01/02/2024   IR PARACENTESIS  03/26/2024   IR THORACENTESIS RIGHT ASP PLEURAL SPACE W/IMG GUIDE  09/15/2023   POLYPECTOMY  08/16/2022   Procedure: POLYPECTOMY;  Surgeon: Wilhelmenia Aloha Raddle., MD;  Location: [facility] ENDOSCOPY;  Service: Gastroenterology;;   SUBMUCOSAL TATTOO INJECTION  08/16/2022   Procedure: SUBMUCOSAL TATTOO INJECTION;  Surgeon: Wilhelmenia Aloha Raddle., MD;  Location: [facility] ENDOSCOPY;  Service: Gastroenterology;;   UPPER GASTROINTESTINAL ENDOSCOPY     HPI:  Annalyssa Thune is a 64 y.o. female who presented to the hospital on 03/24/24 with c/o chest pain and SOB. She was admitted to the hospital last month for volume overload and has not gone for dialysis since being discharged two weeks prior to this admission. CXR reported bibasilar opacities, favor atelectasis. SLP swallow evaluation ordered on 03/30/24 secondary to patient observed to be coughing when drinking liquids by RN. PMH: COPD, GERD, anxiety/depression, HLD, HTN, chronic pain/neuropathy, unhoused, ESRD on HD, polysubstance abuse.    Assessment / Plan / Recommendation  Clinical Impression  SLP recommending downgrade to Dys 3 (mechanical soft) solids, thin liquids and will follow for toleration.  Patient presents with clinical s/s of dysphagia as per this bedside swallow evaluation with SLP suspecting a primary cogntive-based oral dysphagia. Patient with prolonged mastication (is edentulous) with solids as well as inconsistent awareness to PO's in oral cavity. Swallow initiation appeared timely and patient did not exhibit any overt  s/s aspiration with PO intake.  SLP Visit Diagnosis: Dysphagia, oral phase (R13.11)    Aspiration Risk  Mild aspiration risk    Diet Recommendation Dysphagia 3 (Mech soft);Thin liquid    Liquid Administration via: Cup;Straw Medication Administration: Other (Comment) (as tolerated) Supervision: Patient able to self feed;Full supervision/cueing for compensatory  strategies Compensations: Slow rate;Small sips/bites;Minimize environmental distractions Postural Changes: Seated upright at 90 degrees    Other Recommendations Oral Care Recommendations: Oral care BID     Swallow Evaluation Recommendations     Assistance Recommended at Discharge    Functional Status Assessment Patient has had a recent decline in their functional status and demonstrates the ability to make significant improvements in function in a reasonable and predictable amount of time.  Frequency and Duration min 1 x/week  1 week       Prognosis Prognosis for improved oropharyngeal function: Fair Barriers to Reach Goals: Cognitive deficits      Swallow Study   General Date of Onset: 03/30/24 HPI: Makaylynn Loeper is a 64 y.o. female who presented to the hospital on 03/24/24 with c/o chest pain and SOB. She was admitted to the hospital last month for volume overload and has not gone for dialysis since being discharged two weeks prior to this admission. CXR reported bibasilar opacities, favor atelectasis. SLP swallow evaluation ordered on 03/30/24 secondary to patient observed to be coughing when drinking liquids by RN. PMH: COPD, GERD, anxiety/depression, HLD, HTN, chronic pain/neuropathy, unhoused, ESRD on HD, polysubstance abuse. Type of Study: Bedside Swallow Evaluation Previous Swallow Assessment: 2023 BSE Diet Prior to this Study: Regular;Thin liquids (Level 0) Temperature Spikes Noted: No Respiratory Status: Nasal cannula History of Recent Intubation: No Behavior/Cognition: Alert;Confused;Requires cueing Oral Cavity Assessment: Dry Oral Care Completed by SLP: No Oral Cavity - Dentition: Edentulous Self-Feeding Abilities: Able to feed self;Needs set up Patient Positioning: Upright in bed Baseline Vocal Quality: Hoarse Volitional Cough: Cognitively unable to elicit Volitional Swallow: Unable to elicit    Oral/Motor/Sensory Function Overall Oral Motor/Sensory Function: Other  (comment) (no focal weakness)   Ice Chips     Thin Liquid Thin Liquid: Within functional limits Presentation: Straw;Self Fed    Nectar Thick     Honey Thick     Puree     Solid     Solid: Impaired Presentation: Self Fed Oral Phase Impairments: Poor awareness of bolus Oral Phase Functional Implications: Prolonged oral transit;Impaired mastication;Oral holding      Norleen IVAR Blase, MA, CCC-SLP Speech Therapy  03/30/2024,3:23 PM

## 2024-03-30 NOTE — Progress Notes (Signed)
 Pt more confused and impulsive today. Pt taking off mepilex foam borders of skin tears that she has gotten. Pt has attempted to get out out of bed byself multiple times throughout shift and un redirectable, order for sitter in room, but no one available. Pt demanding more O2 from 2l to 3l [place]. Pt still awake and attempting to get out of bed, around 630 pm O2 sats noted to be 78% on 3L nasal canula and becoming more lethargic and unable to keep eyes open, bumped up to 10L and RT called. Also called RRT and MD made aware of situation. Pt able to wean to 7l [place] at this time.

## 2024-03-30 NOTE — Progress Notes (Signed)
" °  Overnight Coverage Note  Patient was seen for acute respiratory failure with hypoxia and hypercarbia. Notes, imaging, labs, and vitals reviewed.   She developed increased O2 requirement during the day and has become less responsive. Oxygenation improved with deep suction and CXR and VBG were obtained.   She is lethargic and mildly tachypneic with fine rales auscultated bilaterally, no wheezes.   Plan to keep NPO for now, start BiPAP, and repeat blood gas.  "

## 2024-03-30 NOTE — Significant Event (Signed)
 Rapid Response Event Note   Reason for Call :  Obtundent  Initial Focused Assessment:  Patient in bed, obtundent, not opening eyes with painful stimulus. Cough and gag intact with deep suctioning. No signs of respiratory distress. Haldol  given at 18:11 due to agitation. Oxygen  saturation 90% on 10L.      Interventions:  Deep suction- oxygen  saturation increased to 97% ABG ordered- unable to obtain- changed to VBG Chest xray  Plan of Care:  Awake results, continue to monitor respiratory and neuro status.    Event Summary:   Call Time: 1828 Arrival Time: 20: 20 End Time:2045  Delon Daub, RN

## 2024-03-30 NOTE — Progress Notes (Addendum)
 " [facility] Progress Note   Subjective:    Seen and examined patient at bedside. Tolerated yesterday's HD with net UF 3L. She's restless in the recliner and trying to get back to her bed. On O2. Next HD 1/14.  Objective Vitals:   03/29/24 2052 03/30/24 0519 03/30/24 0755 03/30/24 0913  BP: (!) 168/81 (!) 143/69  (!) 148/66  Pulse: 82 75  81  Resp: 13 13  20   Temp: 98.3 F (36.8 C) 98.3 F (36.8 C)  98.1 F (36.7 C)  TempSrc: Oral     SpO2: 100%  94% 95%  Weight:      Height:       Physical Exam General: restless, minimal conversation Heart: RRR Lungs: Diminished posteriorly throughout Abdomen: hard, distended, +edema Extremities: Trace LE edema Dialysis Access: RU AVF +b   Filed Weights   03/27/24 0310 03/29/24 1333 03/29/24 1740  Weight: 65.2 kg 66.8 kg 63.8 kg    Intake/Output Summary (Last 24 hours) at 03/30/2024 1045 Last data filed at 03/30/2024 0630 Gross per 24 hour  Intake 630 ml  Output 3005 ml  Net -2375 ml    Additional Objective Labs: Basic Metabolic Panel: Recent Labs  Lab 03/25/24 0450 03/28/24 1103 03/29/24 0622  NA 127* 126* 129*  K 4.4 4.1 4.1  CL 87* 88* 90*  CO2 25 25 27   GLUCOSE 98 85 95  BUN 53* 31* 36*  CREATININE 3.88* 3.03* 3.58*  CALCIUM  7.8* 7.9* 8.1*  PHOS  --   --  5.9*   Liver Function Tests: Recent Labs  Lab 03/23/24 1850 03/29/24 0622  AST 17 16  ALT 31 16  ALKPHOS 138* 111  BILITOT 0.4 0.3  PROT 7.2 6.4*  ALBUMIN  3.8 3.5   No results for input(s): LIPASE, AMYLASE in the last 168 hours. CBC: Recent Labs  Lab 03/23/24 1850 03/24/24 0637 03/25/24 0450 03/28/24 1103  WBC 9.2 7.8 7.8 5.9  NEUTROABS 7.6  --   --   --   HGB 7.2* 7.0* 8.1* 8.2*  HCT 22.7* 22.4* 25.3* 24.7*  MCV 102.3* 101.8* 97.3 93.9  PLT 459* 424* 375 331   Blood Culture    Component Value Date/Time   SDES PERITONEAL 01/02/2024 1152   SDES PERITONEAL 01/02/2024 1152   SPECREQUEST NONE 01/02/2024 1152   SPECREQUEST  NONE 01/02/2024 1152   CULT  01/02/2024 1152    NO GROWTH 5 DAYS Performed at [facility], [address], [place], [place] [address]    REPTSTATUS 01/07/2024 FINAL 01/02/2024 1152   REPTSTATUS 01/02/2024 FINAL 01/02/2024 1152    Cardiac Enzymes: No results for input(s): CKTOTAL, CKMB, CKMBINDEX, TROPONINI in the last 168 hours. CBG: No results for input(s): GLUCAP in the last 168 hours. Iron  Studies:  Recent Labs    03/27/24 1646  IRON  54  TIBC 281  FERRITIN 633*   Lab Results  Component Value Date   INR 1.1 03/10/2024   INR 1.1 12/30/2023   INR 1.1 05/16/2023   Studies/Results: No results found.  Medications:  iron  sucrose Stopped (03/29/24 1837)    sodium chloride    Intravenous Once   amLODipine   10 mg Oral Daily   atorvastatin   10 mg Oral Daily   budesonide -glycopyrrolate -formoterol   2 puff Inhalation BID   busPIRone   5 mg Oral TID   calcitRIOL   0.25 mcg Oral Daily   carvedilol   6.25 mg Oral BID WC   Chlorhexidine  Gluconate Cloth  6 each Topical Q0600   [  START ON 03/31/2024] darbepoetin (ARANESP ) injection - DIALYSIS  60 mcg Subcutaneous Q Wed-1800   folic acid   1 mg Oral Daily   gabapentin   100 mg Oral TID   heparin  injection (subcutaneous)  5,000 Units Subcutaneous Q8H   multivitamin with minerals  1 tablet Oral Daily   pantoprazole   40 mg Oral BID   thiamine   100 mg Oral Daily   torsemide   100 mg Oral Daily   ziprasidone   20 mg Oral BID WC    Dialysis Orders: from 2024 -->  3h  B350 Heparin  none  RUA AVF No dialysis center currently    Assessment/Plan: 1. Volume overload - CXR on 1/6 with vascular congestion, pleural effusion and atelectasis.  +hypoxia on 3L O2. +Ascites. HD earlier this week with net UF 2.9L. Unable to dialyze Friday night due to agitation.             - had HD last night (Sat) w/ 2.9 L UF - UF as tolerated   2. Ascites  - s/p paracentesis with net UF .  3. ESRD - Does not have regular HD center. Had HD  here on 1/7, 1/10, and 1/12. Next HD 1/14.             -renal navigator following 4. Anemia of CKD- Hgb 8.2, Venofer  x 2 ordered, ESA start 1/14 5. Secondary hyperparathyroidism - Ca a little low. Calcitriol  started.  Using increased Ca bath.  Phos mildly elevated, start binders if indicated.  6. HTN/volume - Blood pressure slowly coming down with HD, cont to lower volume w/ HD as tolerated 7. Nutrition - On regular diet, monitor labs.  Added fluid restrictions . 8. COPD - continue home meds.   Charmaine Piety, NP [facility] 03/30/2024,10:45 AM  LOS: 6 days    "

## 2024-03-30 NOTE — Progress Notes (Signed)
 Date and time results received: 03/30/2024 2120   Test: [facility] Critical Value: 7.06  Name of Provider Notified: Opyd, T  Orders Received? Or Actions Taken?: MD notified, awaiting orders

## 2024-03-30 NOTE — Progress Notes (Signed)
 " PROGRESS NOTE    [name]  FMW:[id number] DOB: September 11, 1960 DOA: 03/23/2024 PCP: Pcp, No   Brief Narrative:  64 y.o. female with medical history significant of ESRD on HD with history of noncompliance due to homelessness, HFpEF, COPD, polysubstance abuse, hypertension, hyperlipidemia, GERD, anxiety, depression, anemia, hepatic cirrhosis presenting with complaints of chest pain and shortness of breath.  Patient is a poor historian.  She is reporting substernal chest heaviness and shortness of breath for several days.  She was admitted to the hospital last month for volume overload and has not gone for dialysis since she was discharged 2 weeks ago.  She reports taking all of her medications.  Assessment & Plan:   Principal Problem:   Volume overload Active Problems:   CHF exacerbation (HCC)   ESRD on dialysis (HCC)   Hyperkalemia   COPD (chronic obstructive pulmonary disease) (HCC)  Patient declines alcohol  use/abuse, withdrawals ruled out  Acute encephalopathy likely metabolic, improving Rule out hospital delirium - Start low dose BID geodon  - CIWA discontinued - Mental status improving but continues to be labile - patient currently alert to person and place only  Acute hypoxemic respiratory failure secondary to volume overload Significantly improved after hemodialysis ESRD with dialysis noncompliance due to homelessness Last echo done 10/23/2023 showing EF 60 to 65%, grade 2 diastolic dysfunction, left atrium moderately dilated, and mild mitral regurgitation Chest x-ray showing vascular congestion and small to moderate bilateral pleural effusions.  Nephrology following, appreciate insight and recommendations Continue home torsemide .   Continue supplemental oxygen , wean as tolerated, no longer requiring BiPAP   Rule out dysphagia - Noted coughing today after p.o. intake, speech consulted  Chest pain-resolved Troponin minimally elevated at intake, flat, not consistent with  ACS Echo noted global hypokinesis grade 1 diastolic dysfunction EF 50 to 44% Symptom likely in setting of missed hemodialysis with worsening pulmonary edema and vascular congestion   Hepatic cirrhosis History of alcohol  use/abuse -no longer drinking -Likely chronic in the setting of alcohol  use and abuse Paracentesis 1/9 - only 675 mL of clear yellow fluid obtained by IR Abdominal ascites ongoing, if worsening over the next 24 hours would reconsider repeat paracentesis   Chronic anemia At baseline, no signs or symptoms of bleeding  Hyponatremia Mild hyperkalemia Hypocalcemia Chronic mild metabolic acidosis Resolved -advance diet as tolerated, electrolytes per dialysis   COPD Without acute exacerbation, continue home inhalers, nebs, supplemental oxygen  as above Hypertension Continue amlodipine  and Coreg . Hyperlipidemia Continue Lipitor. GERD Continue Protonix . Anxiety/depression Continue BuSpar . Chronic pain/neuropathy Continue gabapentin . Homelessness TOC consulted.  DVT prophylaxis: heparin  injection 5,000 Units Start: 03/25/24 1400 SCDs Start: 03/24/24 9380 Code Status:   Code Status: Limited: Do not attempt resuscitation (DNR) -DNR-LIMITED -Do Not Intubate/DNI  Family Communication: None present  Status is: Inpatient  Dispo: The patient is from: Homeless              Anticipated d/c is to: To be determined              Anticipated d/c date is: To be determined              Patient currently not medically stable for discharge  Consultants:  Nephrology  Procedures:  Paracentesis  Antimicrobials:  None  Subjective: No acute issues or events overnight -currently denies nausea vomiting diarrhea constipation headache fevers chills chest pain  Objective: Vitals:   03/29/24 1740 03/29/24 1800 03/29/24 2052 03/30/24 0519  BP: (!) 153/77 (!) 153/109 (!) 168/81 (!) 143/69  Pulse:  82 80 82 75  Resp: (!) 22 17 13 13   Temp: 97.7 F (36.5 C) 97.9 F (36.6 C) 98.3 F  (36.8 C) 98.3 F (36.8 C)  TempSrc:   Oral   SpO2: 100% 100% 100%   Weight: 63.8 kg     Height:        Intake/Output Summary (Last 24 hours) at 03/30/2024 0716 Last data filed at 03/29/2024 2200 Gross per 24 hour  Intake 748 ml  Output 3001 ml  Net -2253 ml   Filed Weights   03/27/24 0310 03/29/24 1333 03/29/24 1740  Weight: 65.2 kg 66.8 kg 63.8 kg    Examination:  General: Awake alert oriented to person and place only HEENT:  Normocephalic atraumatic.  Sclerae nonicteric, noninjected.  Extraocular movements intact bilaterally. Neck:  Without mass or deformity.  Trachea is midline. Lungs: Diffuse rhonchi without overt rales or wheeze. Heart:  Regular rate and rhythm.  Without murmurs, rubs, or gallops. Abdomen:  Soft, nontender, nondistended.  Without guarding or rebound. Extremities: Without cyanosis, clubbing, edema, or obvious deformity. Skin:  Warm and dry, no erythema.   Data Reviewed: I have personally reviewed following labs and imaging studies  CBC: Recent Labs  Lab 03/23/24 1850 03/24/24 0637 03/25/24 0450 03/28/24 1103  WBC 9.2 7.8 7.8 5.9  NEUTROABS 7.6  --   --   --   HGB 7.2* 7.0* 8.1* 8.2*  HCT 22.7* 22.4* 25.3* 24.7*  MCV 102.3* 101.8* 97.3 93.9  PLT 459* 424* 375 331   Basic Metabolic Panel: Recent Labs  Lab 03/23/24 1850 03/24/24 0637 03/25/24 0450 03/28/24 1103 03/29/24 0622  NA 123* 125* 127* 126* 129*  K 5.3* 5.2* 4.4 4.1 4.1  CL 83* 84* 87* 88* 90*  CO2 18* 20* 25 25 27   GLUCOSE 93 99 98 85 95  BUN 93* 93* 53* 31* 36*  CREATININE 5.80* 5.88* 3.88* 3.03* 3.58*  CALCIUM  7.7* 7.6* 7.8* 7.9* 8.1*  PHOS  --   --   --   --  5.9*   GFR: Estimated Creatinine Clearance: 15.6 mL/min (A) (by C-G formula based on SCr of 3.58 mg/dL (H)).  Liver Function Tests: Recent Labs  Lab 03/23/24 1850 03/29/24 0622  AST 17 16  ALT 31 16  ALKPHOS 138* 111  BILITOT 0.4 0.3  PROT 7.2 6.4*  ALBUMIN  3.8 3.5   BNP (last 3 results) Recent Labs     03/10/24 0055 03/13/24 2100 03/23/24 1850  PROBNP >35,000.0* >35,000.0* >35,000.0*   Anemia Panel: Recent Labs    03/27/24 1646  FERRITIN 633*  TIBC 281  IRON  54    Recent Results (from the past 240 hours)  Resp panel by RT-PCR (RSV, Flu A&B, Covid) Anterior Nasal Swab     Status: None   Collection Time: 03/24/24  1:50 AM   Specimen: Anterior Nasal Swab  Result Value Ref Range Status   SARS Coronavirus 2 by RT PCR NEGATIVE NEGATIVE Final   Influenza A by PCR NEGATIVE NEGATIVE Final   Influenza B by PCR NEGATIVE NEGATIVE Final    Comment: (NOTE) The Xpert Xpress SARS-CoV-2/FLU/RSV plus assay is intended as an aid in the diagnosis of influenza from Nasopharyngeal swab specimens and should not be used as a sole basis for treatment. Nasal washings and aspirates are unacceptable for Xpert Xpress SARS-CoV-2/FLU/RSV testing.  Fact Sheet for Patients: bloggercourse.com  Fact Sheet for Healthcare Providers: seriousbroker.it  This test is not yet approved or cleared by the [place]  FDA and has  been authorized for detection and/or diagnosis of SARS-CoV-2 by FDA under an Emergency Use Authorization (EUA). This EUA will remain in effect (meaning this test can be used) for the duration of the COVID-19 declaration under Section 564(b)(1) of the Act, 21 U.S.C. section 360bbb-3(b)(1), unless the authorization is terminated or revoked.     Resp Syncytial Virus by PCR NEGATIVE NEGATIVE Final    Comment: (NOTE) Fact Sheet for Patients: bloggercourse.com  Fact Sheet for Healthcare Providers: seriousbroker.it  This test is not yet approved or cleared by the [place]  FDA and has been authorized for detection and/or diagnosis of SARS-CoV-2 by FDA under an Emergency Use Authorization (EUA). This EUA will remain in effect (meaning this test can be used) for the duration of  the COVID-19 declaration under Section 564(b)(1) of the Act, 21 U.S.C. section 360bbb-3(b)(1), unless the authorization is terminated or revoked.  Performed at [facility], [address], [place], [place] [address]   MRSA Next Gen by PCR, Nasal     Status: None   Collection Time: 03/26/24  9:03 AM   Specimen: Nasal Mucosa; Nasal Swab  Result Value Ref Range Status   MRSA by PCR Next Gen NOT DETECTED NOT DETECTED Final    Comment: (NOTE) The GeneXpert MRSA Assay (FDA approved for NASAL specimens only), is one component of a comprehensive MRSA colonization surveillance program. It is not intended to diagnose MRSA infection nor to guide or monitor treatment for MRSA infections. Test performance is not FDA approved in patients less than 65 years old. Performed at [facility], [address] [place], [place] [address]      Radiology Studies: No results found.   Scheduled Meds:  sodium chloride    Intravenous Once   amLODipine   10 mg Oral Daily   atorvastatin   10 mg Oral Daily   budesonide -glycopyrrolate -formoterol   2 puff Inhalation BID   busPIRone   5 mg Oral TID   calcitRIOL   0.25 mcg Oral Daily   carvedilol   6.25 mg Oral BID WC   Chlorhexidine  Gluconate Cloth  6 each Topical Q0600   [START ON 03/31/2024] darbepoetin (ARANESP ) injection - DIALYSIS  60 mcg Subcutaneous Q Wed-1800   folic acid   1 mg Oral Daily   gabapentin   100 mg Oral TID   heparin  injection (subcutaneous)  5,000 Units Subcutaneous Q8H   LORazepam   0-4 mg Intravenous Q12H   multivitamin with minerals  1 tablet Oral Daily   pantoprazole   40 mg Oral BID   thiamine   100 mg Oral Daily   torsemide   100 mg Oral Daily   Continuous Infusions:  iron  sucrose Stopped (03/29/24 1837)     LOS: 6 days   Time spent:  Elsie JAYSON Montclair, DO [facility]  If 7PM-7AM, please contact night-coverage [email or web address]  03/30/2024, 7:16 AM      "

## 2024-03-31 ENCOUNTER — Inpatient Hospital Stay (HOSPITAL_COMMUNITY)

## 2024-03-31 LAB — RENAL FUNCTION PANEL
Albumin: 3.5 g/dL (ref 3.5–5.0)
Anion gap: 11 (ref 5–15)
BUN: 34 mg/dL — ABNORMAL HIGH (ref 8–23)
CO2: 26 mmol/L (ref 22–32)
Calcium: 8.3 mg/dL — ABNORMAL LOW (ref 8.9–10.3)
Chloride: 90 mmol/L — ABNORMAL LOW (ref 98–111)
Creatinine, Ser: 3.62 mg/dL — ABNORMAL HIGH (ref 0.44–1.00)
GFR, Estimated: 13 mL/min — ABNORMAL LOW
Glucose, Bld: 81 mg/dL (ref 70–99)
Phosphorus: 5.3 mg/dL — ABNORMAL HIGH (ref 2.5–4.6)
Potassium: 4.4 mmol/L (ref 3.5–5.1)
Sodium: 127 mmol/L — ABNORMAL LOW (ref 135–145)

## 2024-03-31 LAB — CBC WITH DIFFERENTIAL/PLATELET
Abs Immature Granulocytes: 0.07 K/uL (ref 0.00–0.07)
Basophils Absolute: 0 K/uL (ref 0.0–0.1)
Basophils Relative: 0 %
Eosinophils Absolute: 0 K/uL (ref 0.0–0.5)
Eosinophils Relative: 0 %
HCT: 23.5 % — ABNORMAL LOW (ref 36.0–46.0)
Hemoglobin: 7.4 g/dL — ABNORMAL LOW (ref 12.0–15.0)
Immature Granulocytes: 1 %
Lymphocytes Relative: 4 %
Lymphs Abs: 0.4 K/uL — ABNORMAL LOW (ref 0.7–4.0)
MCH: 31.5 pg (ref 26.0–34.0)
MCHC: 31.5 g/dL (ref 30.0–36.0)
MCV: 100 fL (ref 80.0–100.0)
Monocytes Absolute: 0.8 K/uL (ref 0.1–1.0)
Monocytes Relative: 9 %
Neutro Abs: 7.6 K/uL (ref 1.7–7.7)
Neutrophils Relative %: 86 %
Platelets: 255 K/uL (ref 150–400)
RBC: 2.35 MIL/uL — ABNORMAL LOW (ref 3.87–5.11)
RDW: 17.2 % — ABNORMAL HIGH (ref 11.5–15.5)
Smear Review: NORMAL
WBC: 8.8 K/uL (ref 4.0–10.5)
nRBC: 0 % (ref 0.0–0.2)

## 2024-03-31 LAB — COMPREHENSIVE METABOLIC PANEL WITH GFR
ALT: 15 U/L (ref 0–44)
AST: 16 U/L (ref 15–41)
Albumin: 3.6 g/dL (ref 3.5–5.0)
Alkaline Phosphatase: 105 U/L (ref 38–126)
Anion gap: 11 (ref 5–15)
BUN: 34 mg/dL — ABNORMAL HIGH (ref 8–23)
CO2: 27 mmol/L (ref 22–32)
Calcium: 8.4 mg/dL — ABNORMAL LOW (ref 8.9–10.3)
Chloride: 90 mmol/L — ABNORMAL LOW (ref 98–111)
Creatinine, Ser: 3.64 mg/dL — ABNORMAL HIGH (ref 0.44–1.00)
GFR, Estimated: 13 mL/min — ABNORMAL LOW
Glucose, Bld: 81 mg/dL (ref 70–99)
Potassium: 4.4 mmol/L (ref 3.5–5.1)
Sodium: 128 mmol/L — ABNORMAL LOW (ref 135–145)
Total Bilirubin: 0.3 mg/dL (ref 0.0–1.2)
Total Protein: 6.6 g/dL (ref 6.5–8.1)

## 2024-03-31 LAB — BLOOD GAS, VENOUS
Acid-Base Excess: 3.7 mmol/L — ABNORMAL HIGH (ref 0.0–2.0)
Bicarbonate: 33.2 mmol/L — ABNORMAL HIGH (ref 20.0–28.0)
Drawn by: 73734
O2 Saturation: 90.6 %
Patient temperature: 36.7
pCO2, Ven: 73 mmHg (ref 44–60)
pH, Ven: 7.26 (ref 7.25–7.43)
pO2, Ven: 54 mmHg — ABNORMAL HIGH (ref 32–45)

## 2024-03-31 LAB — PARATHYROID HORMONE, INTACT (NO CA): PTH: 525 pg/mL — ABNORMAL HIGH (ref 15–65)

## 2024-03-31 MED ORDER — HEPARIN SODIUM (PORCINE) 1000 UNIT/ML DIALYSIS: 1000.0000 [IU] | INTRAMUSCULAR | Status: DC | PRN

## 2024-03-31 MED ORDER — HALOPERIDOL LACTATE 5 MG/ML IJ SOLN
5.0000 mg | Freq: Once | INTRAMUSCULAR | Status: AC
  Administered 2024-03-31: 5 mg via INTRAVENOUS

## 2024-03-31 MED ORDER — ZIPRASIDONE MESYLATE 20 MG IM SOLR
20.0000 mg | Freq: Once | INTRAMUSCULAR | Status: AC
  Administered 2024-03-31: 20 mg via INTRAMUSCULAR
  Filled 2024-03-31: qty 20

## 2024-03-31 MED ORDER — HEPARIN SODIUM (PORCINE) 1000 UNIT/ML DIALYSIS: 1500.0000 [IU] | INTRAMUSCULAR | Status: DC | PRN

## 2024-03-31 MED ORDER — ANTICOAGULANT SODIUM CITRATE 4% (200MG/5ML) IV SOLN: 5.0000 mL | Status: DC | PRN

## 2024-03-31 MED ORDER — HEPARIN SODIUM (PORCINE) 1000 UNIT/ML DIALYSIS: 2500.0000 [IU] | Freq: Once | INTRAMUSCULAR | Status: DC

## 2024-03-31 MED ORDER — ALTEPLASE 2 MG IJ SOLR: 2.0000 mg | Freq: Once | INTRAMUSCULAR | Status: DC | PRN

## 2024-03-31 MED ORDER — LORAZEPAM 2 MG/ML IJ SOLN
2.0000 mg | INTRAMUSCULAR | Status: DC | PRN
  Administered 2024-03-31: 2 mg via INTRAVENOUS
  Filled 2024-03-31: qty 1

## 2024-03-31 MED ORDER — PENTAFLUOROPROP-TETRAFLUOROETH EX AERO: 1.0000 | INHALATION_SPRAY | CUTANEOUS | Status: DC | PRN

## 2024-03-31 MED ORDER — HALOPERIDOL LACTATE 5 MG/ML IJ SOLN
1.0000 mg | Freq: Four times a day (QID) | INTRAMUSCULAR | Status: DC | PRN
  Administered 2024-03-31: 1 mg via INTRAVENOUS
  Filled 2024-03-31 (×2): qty 1

## 2024-03-31 MED ORDER — LIDOCAINE HCL (PF) 1 % IJ SOLN: 5.0000 mL | INTRAMUSCULAR | Status: DC | PRN

## 2024-03-31 MED ORDER — LIDOCAINE-PRILOCAINE 2.5-2.5 % EX CREA: 1.0000 | TOPICAL_CREAM | CUTANEOUS | Status: DC | PRN

## 2024-03-31 MED ORDER — STERILE WATER FOR INJECTION IJ SOLN
INTRAMUSCULAR | Status: AC
  Filled 2024-03-31: qty 10

## 2024-03-31 NOTE — Progress Notes (Signed)
 Bipap removed briefly for assessment. Spitting out meds, unable to administer during trial off bipap.  Dr. Fairy notified of refusal and VBG CO2 73, Bipap reapplied as ordered by Dr. Fairy.

## 2024-03-31 NOTE — Progress Notes (Signed)
 Transferred to HD with transport, RT, and RN, on bipap and Tele. Geodon  administered prior transfer due to combativeness, to facilitate HD. Geodon  was discussed with night charge RN and pharmacy.

## 2024-03-31 NOTE — Procedures (Signed)
 I was present at this dialysis session. I have reviewed the session itself and made appropriate changes.   Filed Weights   03/29/24 1740 03/31/24 0330 03/31/24 0814  Weight: 63.8 kg 67.6 kg 67.3 kg    Recent Labs  Lab 03/31/24 0834  NA 127*  K 4.4  CL 90*  CO2 26  GLUCOSE 81  BUN 34*  CREATININE 3.62*  CALCIUM  8.3*  PHOS 5.3*    Recent Labs  Lab 03/25/24 0450 03/28/24 1103 03/31/24 0833  WBC 7.8 5.9 8.8  NEUTROABS  --   --  7.6  HGB 8.1* 8.2* 7.4*  HCT 25.3* 24.7* 23.5*  MCV 97.3 93.9 100.0  PLT 375 331 255    Scheduled Meds:  sodium chloride    Intravenous Once   amLODipine   10 mg Oral Daily   atorvastatin   10 mg Oral Daily   budesonide -glycopyrrolate -formoterol   2 puff Inhalation BID   busPIRone   5 mg Oral TID   calcitRIOL   0.25 mcg Oral Daily   carvedilol   6.25 mg Oral BID WC   Chlorhexidine  Gluconate Cloth  6 each Topical Q0600   darbepoetin (ARANESP ) injection - DIALYSIS  60 mcg Subcutaneous Q Wed-1800   folic acid   1 mg Oral Daily   gabapentin   100 mg Oral TID   heparin   2,500 Units Dialysis Once in dialysis   heparin  injection (subcutaneous)  5,000 Units Subcutaneous Q8H   multivitamin with minerals  1 tablet Oral Daily   pantoprazole   40 mg Oral BID   thiamine   100 mg Oral Daily   torsemide   100 mg Oral Daily   ziprasidone   20 mg Oral BID WC   Continuous Infusions:  anticoagulant sodium citrate      iron  sucrose Stopped (03/29/24 1837)   PRN Meds:.acetaminophen  **OR** acetaminophen , alteplase , anticoagulant sodium citrate , heparin , heparin , ipratropium-albuterol , lidocaine  (PF), lidocaine -prilocaine , mouth rinse, pentafluoroprop-tetrafluoroeth   Ephriam Stank, MD [facility] 03/31/2024, 10:06 AM

## 2024-03-31 NOTE — Progress Notes (Signed)
" °   03/31/24 1238  Vitals  Temp 98 F (36.7 C)  Temp Source Axillary  BP 126/68  MAP (mmHg) 84  BP Location Right Arm  BP Method Automatic  Patient Position (if appropriate) Lying  Pulse Rate 61  Pulse Rate Source Monitor  ECG Heart Rate 62  Resp 14  Weight 63.7 kg  Type of Weight Post-Dialysis  Oxygen  Therapy  SpO2 93 %  O2 Device Bi-PAP  Patient Activity (if Appropriate) In bed  Pulse Oximetry Type Continuous  Oximetry Probe Site Changed No  During Treatment Monitoring  Blood Flow Rate (mL/min) 349 mL/min  Arterial Pressure (mmHg) -170.9 mmHg  Venous Pressure (mmHg) 219.38 mmHg  TMP (mmHg) 41.01 mmHg  Ultrafiltration Rate (mL/min) 1966 mL/min  Dialysate Flow Rate (mL/min) 299 ml/min  Duration of HD Treatment -hour(s) 3.47 hour(s)  Cumulative Fluid Removed (mL) per Treatment  3931.86  HD Safety Checks Performed Yes  Intra-Hemodialysis Comments Tolerated well;Tx completed  Post Treatment  Dialyzer Clearance Lightly streaked  Liters Processed 74.5  Fluid Removed (mL) 4000 mL  Tolerated HD Treatment Yes  AVG/AVF Arterial Site Held (minutes) 8 minutes  AVG/AVF Venous Site Held (minutes) 8 minutes  Fistula / Graft Right Upper arm Arteriovenous fistula  Placement Date/Time: 08/14/22 1230   Orientation: Right  Access Location: (c) Upper arm  Access Type: Arteriovenous fistula  Site Condition No complications  Fistula / Graft Assessment Thrill;Present;Bruit  Status Deaccessed  Drainage Description None    "
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
## 2024-03-31 NOTE — Progress Notes (Signed)
 " PROGRESS NOTE    [name]  FMW:[id number] DOB: [birth year] DOA: 03/23/2024 PCP: Pcp, No   64/F w COPD supposed to be on home oxygen , ESRD on HD, noncompliance due to homelessness, polysubstance abuse, diastolic CHF, hypertension, dyslipidemia, anxiety, cirrhosis, ascites back in the ED with chest pain and shortness of breath.  Patient is a poor historian.  She is reporting substernal chest heaviness and shortness of breath for several days.  She was admitted to the hospital last month for volume overload and has not gone for dialysis since she was discharged 2 weeks ago.   - Admitted, started on hemodialysis, volume status improving - 1/13 PM noted to have worsening agitation and confusion, ABG with severe respiratory acidosis, placed on BiPAP, chest x-ray with worsening pulmonary edema -has been refusing some HD Rx even in the hospital and cutting HD short  Subjective: -Had dialysis today, but restless, seen on the floor  Assessment and Plan:  Acute on chronic hypoxic and hypercarbic respiratory failure ESRD on HD, volume overload, pulmonary edema Metabolic encephalopathy Noncompliance with HD -Worsening volume overload, pulmonary edema and pleural effusions on repeat x-ray overnight and this morning, has been refusing and cutting short some HD rx in ths Hospital as well - HD today - Continue to lower dry weight as tolerated - BiPAP  Transient chest pain Resolved Troponin minimally elevated, no ACS Echo with preserved EF, global hypokinesis  Liver cirrhosis History of EtOH abuse, ascites -Underwent paracentesis 1/9, 675 mL fluid drained, - May need repeat paracentesis shortly  COPD/chronic respiratory failure Supposed to be on home oxygen  however noncompliant with homelessness - Continue current inhalers, supplemental O2  Hypertension Continue amlodipine  and Coreg   Anxiety/depression Continue BuSpar   Chronic pain/neuropathy Continue gabapentin   Chronic  anemia Stable   DVT prophylaxis: Heparin  subcutaneous Code Status: DNR Family Communication: No immediate family contacts Disposition Plan: Unknown  Consultants:    Procedures:   Antimicrobials:    Objective: Vitals:   03/31/24 1230 03/31/24 1238 03/31/24 1241 03/31/24 1306  BP: 130/62 126/68 (!) 145/80 119/82  Pulse: 63 61  65  Resp: (!) 21 14  (!) 22  Temp:  98 F (36.7 C)  97.9 F (36.6 C)  TempSrc:  Axillary  Axillary  SpO2: 96% 93%  94%  Weight:  63.7 kg    Height:        Intake/Output Summary (Last 24 hours) at 03/31/2024 1522 Last data filed at 03/31/2024 1238 Gross per 24 hour  Intake 400 ml  Output 4000 ml  Net -3600 ml   Filed Weights   03/31/24 0330 03/31/24 0814 03/31/24 1238  Weight: 67.6 kg 67.3 kg 63.7 kg    Examination:  General exam: Chronically ill appears older than stated age, a bit confused, oriented to self and partly to place only, somnolent HEENT: Positive JVD CVS: S1-S2, regular rhythm Lungs: Decreased breath sounds to bases, few basilar rales Abdomen: Firm, mildly distended, positive fluid thrill Extremities: 1+ edema, mittens on   Data Reviewed:   CBC: Recent Labs  Lab 03/25/24 0450 03/28/24 1103 03/31/24 0833  WBC 7.8 5.9 8.8  NEUTROABS  --   --  7.6  HGB 8.1* 8.2* 7.4*  HCT 25.3* 24.7* 23.5*  MCV 97.3 93.9 100.0  PLT 375 331 255   Basic Metabolic Panel: Recent Labs  Lab 03/25/24 0450 03/28/24 1103 03/29/24 0622 03/31/24 0833 03/31/24 0834  NA 127* 126* 129* 128* 127*  K 4.4 4.1 4.1 4.4 4.4  CL 87* 88* 90*  90* 90*  CO2 25 25 27 27 26   GLUCOSE 98 85 95 81 81  BUN 53* 31* 36* 34* 34*  CREATININE 3.88* 3.03* 3.58* 3.64* 3.62*  CALCIUM  7.8* 7.9* 8.1* 8.4* 8.3*  PHOS  --   --  5.9*  --  5.3*   GFR: Estimated Creatinine Clearance: 15.5 mL/min (A) (by C-G formula based on SCr of 3.62 mg/dL (H)). Liver Function Tests: Recent Labs  Lab 03/29/24 0622 03/31/24 0833 03/31/24 0834  AST 16 16  --   ALT 16 15   --   ALKPHOS 111 105  --   BILITOT 0.3 0.3  --   PROT 6.4* 6.6  --   ALBUMIN  3.5 3.6 3.5   No results for input(s): LIPASE, AMYLASE in the last 168 hours. No results for input(s): AMMONIA in the last 168 hours. Coagulation Profile: No results for input(s): INR, PROTIME in the last 168 hours. Cardiac Enzymes: No results for input(s): CKTOTAL, CKMB, CKMBINDEX, TROPONINI in the last 168 hours. BNP (last 3 results) Recent Labs    03/10/24 0055 03/13/24 2100 03/23/24 1850  PROBNP >35,000.0* >35,000.0* >35,000.0*   HbA1C: No results for input(s): HGBA1C in the last 72 hours. CBG: No results for input(s): GLUCAP in the last 168 hours. Lipid Profile: No results for input(s): CHOL, HDL, LDLCALC, TRIG, CHOLHDL, LDLDIRECT in the last 72 hours. Thyroid Function Tests: No results for input(s): TSH, T4TOTAL, FREET4, T3FREE, THYROIDAB in the last 72 hours. Anemia Panel: No results for input(s): VITAMINB12, FOLATE, FERRITIN, TIBC, IRON , RETICCTPCT in the last 72 hours. Urine analysis:    Component Value Date/Time   COLORURINE YELLOW 12/30/2023 1305   APPEARANCEUR CLOUDY (A) 12/30/2023 1305   LABSPEC 1.011 12/30/2023 1305   PHURINE 6.0 12/30/2023 1305   GLUCOSEU 50 (A) 12/30/2023 1305   HGBUR SMALL (A) 12/30/2023 1305   BILIRUBINUR NEGATIVE 12/30/2023 1305   KETONESUR NEGATIVE 12/30/2023 1305   PROTEINUR 100 (A) 12/30/2023 1305   UROBILINOGEN 0.2 03/02/2010 2027   NITRITE NEGATIVE 12/30/2023 1305   LEUKOCYTESUR LARGE (A) 12/30/2023 1305   Sepsis Labs: @LABRCNTIP (procalcitonin:4,lacticidven:4)  ) Recent Results (from the past 240 hours)  Resp panel by RT-PCR (RSV, Flu A&B, Covid) Anterior Nasal Swab     Status: None   Collection Time: 03/24/24  1:50 AM   Specimen: Anterior Nasal Swab  Result Value Ref Range Status   SARS Coronavirus 2 by RT PCR NEGATIVE NEGATIVE Final   Influenza A by PCR NEGATIVE NEGATIVE Final    Influenza B by PCR NEGATIVE NEGATIVE Final    Comment: (NOTE) The Xpert Xpress SARS-CoV-2/FLU/RSV plus assay is intended as an aid in the diagnosis of influenza from Nasopharyngeal swab specimens and should not be used as a sole basis for treatment. Nasal washings and aspirates are unacceptable for Xpert Xpress SARS-CoV-2/FLU/RSV testing.  Fact Sheet for Patients: bloggercourse.com  Fact Sheet for Healthcare Providers: seriousbroker.it  This test is not yet approved or cleared by the [place]  FDA and has been authorized for detection and/or diagnosis of SARS-CoV-2 by FDA under an Emergency Use Authorization (EUA). This EUA will remain in effect (meaning this test can be used) for the duration of the COVID-19 declaration under Section 564(b)(1) of the Act, 21 U.S.C. section 360bbb-3(b)(1), unless the authorization is terminated or revoked.     Resp Syncytial Virus by PCR NEGATIVE NEGATIVE Final    Comment: (NOTE) Fact Sheet for Patients: bloggercourse.com  Fact Sheet for Healthcare Providers: seriousbroker.it  This test is not yet approved  or cleared by the [place]  FDA and has been authorized for detection and/or diagnosis of SARS-CoV-2 by FDA under an Emergency Use Authorization (EUA). This EUA will remain in effect (meaning this test can be used) for the duration of the COVID-19 declaration under Section 564(b)(1) of the Act, 21 U.S.C. section 360bbb-3(b)(1), unless the authorization is terminated or revoked.  Performed at [facility], [address], [place], [place] [address]   MRSA Next Gen by PCR, Nasal     Status: None   Collection Time: 03/26/24  9:03 AM   Specimen: Nasal Mucosa; Nasal Swab  Result Value Ref Range Status   MRSA by PCR Next Gen NOT DETECTED NOT DETECTED Final    Comment: (NOTE) The GeneXpert MRSA Assay (FDA approved for NASAL  specimens only), is one component of a comprehensive MRSA colonization surveillance program. It is not intended to diagnose MRSA infection nor to guide or monitor treatment for MRSA infections. Test performance is not FDA approved in patients less than 42 years old. Performed at [facility], [address], [place], [place] [address]      Radiology Studies: DG CHEST PORT 1 VIEW Result Date: 03/31/2024 EXAM: 1 VIEW(S) XRAY OF THE CHEST 03/31/2024 06:42:00 AM COMPARISON: 03/30/2024 CLINICAL HISTORY: The patient presents with volume excess. FINDINGS: LUNGS AND PLEURA: No focal pulmonary opacity. Decreased small left pleural effusion. Similar small right pleural effusion. Increased interstitial markings, consistent with pulmonary edema. No pneumothorax. HEART AND MEDIASTINUM: Cardiomegaly. BONES AND SOFT TISSUES: No acute osseous abnormality. IMPRESSION: 1. Increased interstitial markings, consistent with pulmonary edema. 2. Decreased left pleural effusion and similar small right pleural effusion. Electronically signed by: Waddell Calk MD 03/31/2024 07:49 AM EST RP Workstation: [id number]   DG Chest Port 1 View Result Date: 03/30/2024 EXAM: 1 VIEW(S) XRAY OF THE CHEST 03/30/2024 08:50:00 PM COMPARISON: 03/23/2024 CLINICAL HISTORY: The patient presents with acute respiratory distress. ICD10 code 33497: Acute respiratory distress. FINDINGS: LUNGS AND PLEURA: Moderate left and small right pleural effusions, similar to prior exam. Stable retrocardiac consolidation. Mild, progressive interstitial pulmonary edema. No pneumothorax. HEART AND MEDIASTINUM: Cardiomegaly. BONES AND SOFT TISSUES: No acute osseous abnormality. IMPRESSION: 1. Mild, progressive interstitial pulmonary edema. 2. Moderate left and small right pleural effusions, similar to prior exam. 3. Stable retrocardiac consolidation. 4. Cardiomegaly. Electronically signed by: Dorethia Molt MD 03/30/2024 08:52 PM EST RP Workstation: [id number]      Scheduled Meds:  sodium chloride    Intravenous Once   amLODipine   10 mg Oral Daily   atorvastatin   10 mg Oral Daily   budesonide -glycopyrrolate -formoterol   2 puff Inhalation BID   busPIRone   5 mg Oral TID   calcitRIOL   0.25 mcg Oral Daily   carvedilol   6.25 mg Oral BID WC   Chlorhexidine  Gluconate Cloth  6 each Topical Q0600   darbepoetin (ARANESP ) injection - DIALYSIS  60 mcg Subcutaneous Q Wed-1800   folic acid   1 mg Oral Daily   gabapentin   100 mg Oral TID   heparin  injection (subcutaneous)  5,000 Units Subcutaneous Q8H   multivitamin with minerals  1 tablet Oral Daily   pantoprazole   40 mg Oral BID   thiamine   100 mg Oral Daily   torsemide   100 mg Oral Daily   ziprasidone   20 mg Oral BID WC   Continuous Infusions:  iron  sucrose Stopped (03/29/24 1837)     LOS: 7 days    Time spent:    Sigurd Pac, MD [facility]   03/31/2024,  3:22 PM    "

## 2024-03-31 NOTE — Progress Notes (Signed)
 Pt transported to 3E18 from HD w/o event

## 2024-03-31 NOTE — Progress Notes (Signed)
 Pt transported to HD without event

## 2024-03-31 NOTE — Progress Notes (Signed)
 " [facility] Progress Note   Subjective:    Seen and examined patient on dialysis. Acute resp failure overnight, became less responsive. On BIPAP, CXR with pulm edema. Hypercarbia as well Increased UFG to 4L, temp turned down to 35C, will utilize albumin  if needed to ensure we can get close to UFG. Discussed with [facility] staff  Objective Vitals:   03/31/24 0814 03/31/24 0827 03/31/24 0900 03/31/24 0930  BP: 119/71 118/61 (!) 112/55 (!) 115/57  Pulse: (!) 58 (!) 44 (!) 57 62  Resp: (!) 30 (!) 9 19 14   Temp: 98.3 F (36.8 C)     TempSrc: Oral     SpO2: 98% 99% 99% 97%  Weight: 67.3 kg     Height:       Physical Exam General: lethargic Heart: RRR Lungs: Diminished posteriorly throughout, on bipap Abdomen: hard, distended, +edema Extremities: Trace LE edema Dialysis Access: RU AVF +b   Filed Weights   03/29/24 1740 03/31/24 0330 03/31/24 0814  Weight: 63.8 kg 67.6 kg 67.3 kg    Intake/Output Summary (Last 24 hours) at 03/31/2024 1010 Last data filed at 03/30/2024 2151 Gross per 24 hour  Intake 500 ml  Output 0 ml  Net 500 ml    Additional Objective Labs: Basic Metabolic Panel: Recent Labs  Lab 03/29/24 0622 03/31/24 0833 03/31/24 0834  NA 129* 128* 127*  K 4.1 4.4 4.4  CL 90* 90* 90*  CO2 27 27 26   GLUCOSE 95 81 81  BUN 36* 34* 34*  CREATININE 3.58* 3.64* 3.62*  CALCIUM  8.1* 8.4* 8.3*  PHOS 5.9*  --  5.3*   Liver Function Tests: Recent Labs  Lab 03/29/24 0622 03/31/24 0833 03/31/24 0834  AST 16 16  --   ALT 16 15  --   ALKPHOS 111 105  --   BILITOT 0.3 0.3  --   PROT 6.4* 6.6  --   ALBUMIN  3.5 3.6 3.5   No results for input(s): LIPASE, AMYLASE in the last 168 hours. CBC: Recent Labs  Lab 03/25/24 0450 03/28/24 1103 03/31/24 0833  WBC 7.8 5.9 8.8  NEUTROABS  --   --  7.6  HGB 8.1* 8.2* 7.4*  HCT 25.3* 24.7* 23.5*  MCV 97.3 93.9 100.0  PLT 375 331 255   Blood Culture    Component Value Date/Time   SDES PERITONEAL  01/02/2024 1152   SDES PERITONEAL 01/02/2024 1152   SPECREQUEST NONE 01/02/2024 1152   SPECREQUEST NONE 01/02/2024 1152   CULT  01/02/2024 1152    NO GROWTH 5 DAYS Performed at [facility], [address] [place], [place] [address]    REPTSTATUS 01/07/2024 FINAL 01/02/2024 1152   REPTSTATUS 01/02/2024 FINAL 01/02/2024 1152    Cardiac Enzymes: No results for input(s): CKTOTAL, CKMB, CKMBINDEX, TROPONINI in the last 168 hours. CBG: No results for input(s): GLUCAP in the last 168 hours. Iron  Studies:  No results for input(s): IRON , TIBC, TRANSFERRIN, FERRITIN in the last 72 hours.  Lab Results  Component Value Date   INR 1.1 03/10/2024   INR 1.1 12/30/2023   INR 1.1 05/16/2023   Studies/Results: DG CHEST PORT 1 VIEW Result Date: 03/31/2024 EXAM: 1 VIEW(S) XRAY OF THE CHEST 03/31/2024 06:42:00 AM COMPARISON: 03/30/2024 CLINICAL HISTORY: The patient presents with volume excess. FINDINGS: LUNGS AND PLEURA: No focal pulmonary opacity. Decreased small left pleural effusion. Similar small right pleural effusion. Increased interstitial markings, consistent with pulmonary edema. No pneumothorax. HEART AND MEDIASTINUM: Cardiomegaly. BONES AND SOFT TISSUES:  No acute osseous abnormality. IMPRESSION: 1. Increased interstitial markings, consistent with pulmonary edema. 2. Decreased left pleural effusion and similar small right pleural effusion. Electronically signed by: Waddell Calk MD 03/31/2024 07:49 AM EST RP Workstation: [id number]   DG Chest Port 1 View Result Date: 03/30/2024 EXAM: 1 VIEW(S) XRAY OF THE CHEST 03/30/2024 08:50:00 PM COMPARISON: 03/23/2024 CLINICAL HISTORY: The patient presents with acute respiratory distress. ICD10 code 33497: Acute respiratory distress. FINDINGS: LUNGS AND PLEURA: Moderate left and small right pleural effusions, similar to prior exam. Stable retrocardiac consolidation. Mild, progressive interstitial pulmonary edema. No pneumothorax.  HEART AND MEDIASTINUM: Cardiomegaly. BONES AND SOFT TISSUES: No acute osseous abnormality. IMPRESSION: 1. Mild, progressive interstitial pulmonary edema. 2. Moderate left and small right pleural effusions, similar to prior exam. 3. Stable retrocardiac consolidation. 4. Cardiomegaly. Electronically signed by: Dorethia Molt MD 03/30/2024 08:52 PM EST RP Workstation: [id number]    Medications:  anticoagulant sodium citrate      iron  sucrose Stopped (03/29/24 1837)    sodium chloride    Intravenous Once   amLODipine   10 mg Oral Daily   atorvastatin   10 mg Oral Daily   budesonide -glycopyrrolate -formoterol   2 puff Inhalation BID   busPIRone   5 mg Oral TID   calcitRIOL   0.25 mcg Oral Daily   carvedilol   6.25 mg Oral BID WC   Chlorhexidine  Gluconate Cloth  6 each Topical Q0600   darbepoetin (ARANESP ) injection - DIALYSIS  60 mcg Subcutaneous Q Wed-1800   folic acid   1 mg Oral Daily   gabapentin   100 mg Oral TID   heparin   2,500 Units Dialysis Once in dialysis   heparin  injection (subcutaneous)  5,000 Units Subcutaneous Q8H   multivitamin with minerals  1 tablet Oral Daily   pantoprazole   40 mg Oral BID   thiamine   100 mg Oral Daily   torsemide   100 mg Oral Daily   ziprasidone   20 mg Oral BID WC    Dialysis Orders: from 2024 -->  3h  B350 Heparin  none  RUA AVF No dialysis center currently    Assessment/Plan: 1. Volume overload, AHRF - CXR on 1/6 with vascular congestion, pleural effusion and atelectasis.  +hypoxia on 3L O2. +Ascites. Pulm edema on cxr 1/14, UF as tolerated   2. Ascites  - s/p paracentesis with net UF .  3. ESRD - Does not have regular HD center. Had HD here on 1/7, 1/10, and 1/12. Running on HD today, will reassess tomorrow for extra treatment             -renal navigator following 4. Anemia of CKD- Hgb 7.4, Venofer  x 2 ordered, ESA start 1/14 5. Secondary hyperparathyroidism - Ca a little low. Calcitriol  started.  Using increased Ca bath.  Phos acceptable,  start binders if indicated.  6. HTN/volume - Blood pressure slowly coming down with HD, cont to lower volume w/ HD as tolerated 7. Nutrition - On regular diet, monitor labs.  Added fluid restrictions . 8. COPD - continue home meds.   Ephriam Stank, MD [facility] 03/31/2024,10:10 AM  LOS: 7 days    "

## 2024-04-01 LAB — CBC
HCT: 23.5 % — ABNORMAL LOW (ref 36.0–46.0)
Hemoglobin: 7.4 g/dL — ABNORMAL LOW (ref 12.0–15.0)
MCH: 31 pg (ref 26.0–34.0)
MCHC: 31.5 g/dL (ref 30.0–36.0)
MCV: 98.3 fL (ref 80.0–100.0)
Platelets: 259 K/uL (ref 150–400)
RBC: 2.39 MIL/uL — ABNORMAL LOW (ref 3.87–5.11)
RDW: 17.4 % — ABNORMAL HIGH (ref 11.5–15.5)
WBC: 6.7 K/uL (ref 4.0–10.5)
nRBC: 0 % (ref 0.0–0.2)

## 2024-04-01 LAB — BASIC METABOLIC PANEL WITH GFR
Anion gap: 9 (ref 5–15)
BUN: 20 mg/dL (ref 8–23)
CO2: 28 mmol/L (ref 22–32)
Calcium: 8.5 mg/dL — ABNORMAL LOW (ref 8.9–10.3)
Chloride: 94 mmol/L — ABNORMAL LOW (ref 98–111)
Creatinine, Ser: 2.59 mg/dL — ABNORMAL HIGH (ref 0.44–1.00)
GFR, Estimated: 20 mL/min — ABNORMAL LOW
Glucose, Bld: 86 mg/dL (ref 70–99)
Potassium: 4.1 mmol/L (ref 3.5–5.1)
Sodium: 132 mmol/L — ABNORMAL LOW (ref 135–145)

## 2024-04-01 MED ORDER — ACETAMINOPHEN 325 MG PO TABS
ORAL_TABLET | ORAL | Status: AC
  Filled 2024-04-01: qty 2

## 2024-04-01 MED ORDER — BUSPIRONE HCL 5 MG PO TABS
5.0000 mg | ORAL_TABLET | Freq: Two times a day (BID) | ORAL | Status: DC
  Administered 2024-04-01 – 2024-04-15 (×27): 5 mg via ORAL
  Filled 2024-04-01 (×27): qty 1

## 2024-04-01 MED ORDER — IPRATROPIUM-ALBUTEROL 0.5-2.5 (3) MG/3ML IN SOLN
RESPIRATORY_TRACT | Status: AC
  Filled 2024-04-01: qty 3

## 2024-04-01 MED ORDER — LORAZEPAM 2 MG/ML IJ SOLN
1.0000 mg | INTRAMUSCULAR | Status: AC | PRN
  Administered 2024-04-01: 1 mg via INTRAVENOUS
  Filled 2024-04-01: qty 1

## 2024-04-01 MED ORDER — GABAPENTIN 100 MG PO CAPS
100.0000 mg | ORAL_CAPSULE | Freq: Every day | ORAL | Status: DC
  Administered 2024-04-02 – 2024-04-15 (×13): 100 mg via ORAL
  Filled 2024-04-01 (×13): qty 1

## 2024-04-01 NOTE — Progress Notes (Signed)
 Patient keeps taking her 02 off after numerous talks about keeping it on and need for the 02. Patient has a comptroller and she keeps taking it off and refusing to put it back on.

## 2024-04-01 NOTE — Progress Notes (Signed)
Patient remains off the floor.

## 2024-04-01 NOTE — Progress Notes (Signed)
 Bipap removed per MD Dr. Sigurd. Patient now on 5l hi flo o2.

## 2024-04-01 NOTE — Progress Notes (Shared)
Patient remains off the floor in hemo

## 2024-04-01 NOTE — Progress Notes (Shared)
 Patient back from hemo to her room. Patient denies complaints.

## 2024-04-01 NOTE — Progress Notes (Shared)
 Patient to hemo with her sitter.

## 2024-04-01 NOTE — Progress Notes (Signed)
 SLP Cancellation Note  Patient Details Name: [name] MRN: [id number] DOB: Nov 16, 1960   Cancelled treatment:       Reason Eval/Treat Not Completed: Patient at procedure or test/unavailable (HD). Will f/u as able.     Leita SAILOR., M.A. CCC-SLP Acute Rehabilitation Services Office: [phone]  Secure chat preferred  04/01/2024, 2:57 PM

## 2024-04-01 NOTE — Plan of Care (Signed)
  Problem: Clinical Measurements: Goal: Diagnostic test results will improve Outcome: Progressing Goal: Respiratory complications will improve Outcome: Progressing   Problem: Coping: Goal: Level of anxiety will decrease Outcome: Progressing   Problem: Safety: Goal: Ability to remain free from injury will improve Outcome: Progressing   

## 2024-04-01 NOTE — Procedures (Signed)
 HD Note:  Some information was entered later than the data was gathered due to patient care needs. The stated time with the data is accurate.  Received patient in bed to unit.   Alert and oriented.  Patient was cooperative. Patient had a sitter with her that she was speaking with nicely.    Informed consent signed and in chart.   Access used: Upper right arm fistula Access issues: No issues with the fistula.  Patient often forgot to keep her arm still, often lifting it above her head.  Patient accepted redirection without issue.  The treatment was interrupted several times with pressures rising related to patient's movement.  The machine shut down suddenly at one point and was reset.  When it shut down the second time, patient decided she was done with treatment for the day.  She signed the dialysis AMA form.   Patient had an episode of coughing that caused her distress.  Respiratory staff member came to the unit to give her a breathing treatment.  This was successful.  TX duration:  2 hours and 45 min  Alert, without acute distress.  Total UF removed: 1800 ml  Hand-off given to patient's nurse.   Transported back to the room   Quantia Grullon L. Lenon, RN [facility].

## 2024-04-01 NOTE — Progress Notes (Signed)
 " PROGRESS NOTE    [name]  FMW:[id number] DOB: 07-06-1960 DOA: 03/23/2024 PCP: Pcp, No   64/F w COPD supposed to be on home oxygen , ESRD on HD, noncompliance due to homelessness, polysubstance abuse, diastolic CHF, hypertension, dyslipidemia, anxiety, cirrhosis, ascites back in the ED with chest pain and shortness of breath.  Patient is a poor historian.  She is reporting substernal chest heaviness and shortness of breath for several days.  She was admitted to the hospital last month for volume overload and has not gone for dialysis since she was discharged 2 weeks ago.   - Admitted, started on hemodialysis, volume status improving - 1/13 PM noted to have worsening agitation and confusion, ABG with severe respiratory acidosis, placed on BiPAP, chest x-ray with worsening pulmonary edema -has been refusing some HD Rx even in the hospital and cutting HD short  Subjective: - Agitated yesterday had to be sedated to tolerate BiPAP, breathing better this morning, mental status is improving as well  Assessment and Plan:  Acute on chronic hypoxic and hypercarbic respiratory failure ESRD on HD, volume overload, pulmonary edema Metabolic encephalopathy Noncompliance with HD -Worsening volume overload, pulmonary edema and pleural effusions on repeat x-ray again yesterday morning, has been refusing and cutting short some HD rx in ths Hospital as well - For repeat HD today, very difficult to keep on BiPAP, will wean off this morning, mental status is improving - Continue to lower dry weight as tolerated  Transient chest pain Resolved Troponin minimally elevated, no ACS Echo with preserved EF, global hypokinesis  Liver cirrhosis History of EtOH abuse, ascites -Underwent paracentesis 1/9, 675 mL fluid drained, - May need repeat paracentesis shortly - Reeval tomorrow  COPD/chronic respiratory failure Supposed to be on home oxygen  however noncompliant with homelessness - Continue current  inhalers, supplemental O2  Hypertension Continue amlodipine  and Coreg   Anxiety/depression Continue BuSpar , dose decreased  Chronic pain/neuropathy Continue gabapentin , decrease dose  Chronic anemia Stable   DVT prophylaxis: Heparin  subcutaneous Code Status: DNR Family Communication: No immediate family contacts Disposition Plan: Unknown  Consultants:    Procedures:   Antimicrobials:    Objective: Vitals:   04/01/24 0459 04/01/24 0714 04/01/24 1000 04/01/24 1035  BP:  (!) 134/53  135/65  Pulse: 71 80  78  Resp: (!) 27 20  19   Temp:  98.9 F (37.2 C)  98.8 F (37.1 C)  TempSrc:  Axillary  Oral  SpO2: 99% 99% 93% 90%  Weight:      Height:        Intake/Output Summary (Last 24 hours) at 04/01/2024 1219 Last data filed at 04/01/2024 1016 Gross per 24 hour  Intake 840 ml  Output 4000 ml  Net -3160 ml   Filed Weights   03/31/24 0330 03/31/24 0814 03/31/24 1238  Weight: 67.6 kg 67.3 kg 63.7 kg    Examination:  General exam: Chronically ill appears older than stated age, more awake alert and appropriate this morning, oriented to self and place only HEENT: Positive JVD CVS: S1-S2, regular rhythm Lungs: Decreased breath sounds to bases, few basilar rales Abdomen: Firm, mildly distended, positive fluid thrill Extremities: 1+ edema, mittens on   Data Reviewed:   CBC: Recent Labs  Lab 03/28/24 1103 03/31/24 0833 04/01/24 0257  WBC 5.9 8.8 6.7  NEUTROABS  --  7.6  --   HGB 8.2* 7.4* 7.4*  HCT 24.7* 23.5* 23.5*  MCV 93.9 100.0 98.3  PLT 331 255 259   Basic Metabolic Panel: Recent Labs  Lab 03/28/24 1103 03/29/24 0622 03/31/24 0833 03/31/24 0834 04/01/24 0705  NA 126* 129* 128* 127* 132*  K 4.1 4.1 4.4 4.4 4.1  CL 88* 90* 90* 90* 94*  CO2 25 27 27 26 28   GLUCOSE 85 95 81 81 86  BUN 31* 36* 34* 34* 20  CREATININE 3.03* 3.58* 3.64* 3.62* 2.59*  CALCIUM  7.9* 8.1* 8.4* 8.3* 8.5*  PHOS  --  5.9*  --  5.3*  --    GFR: Estimated Creatinine  Clearance: 21.6 mL/min (A) (by C-G formula based on SCr of 2.59 mg/dL (H)). Liver Function Tests: Recent Labs  Lab 03/29/24 0622 03/31/24 0833 03/31/24 0834  AST 16 16  --   ALT 16 15  --   ALKPHOS 111 105  --   BILITOT 0.3 0.3  --   PROT 6.4* 6.6  --   ALBUMIN  3.5 3.6 3.5   No results for input(s): LIPASE, AMYLASE in the last 168 hours. No results for input(s): AMMONIA in the last 168 hours. Coagulation Profile: No results for input(s): INR, PROTIME in the last 168 hours. Cardiac Enzymes: No results for input(s): CKTOTAL, CKMB, CKMBINDEX, TROPONINI in the last 168 hours. BNP (last 3 results) Recent Labs    03/10/24 0055 03/13/24 2100 03/23/24 1850  PROBNP >35,000.0* >35,000.0* >35,000.0*   HbA1C: No results for input(s): HGBA1C in the last 72 hours. CBG: No results for input(s): GLUCAP in the last 168 hours. Lipid Profile: No results for input(s): CHOL, HDL, LDLCALC, TRIG, CHOLHDL, LDLDIRECT in the last 72 hours. Thyroid Function Tests: No results for input(s): TSH, T4TOTAL, FREET4, T3FREE, THYROIDAB in the last 72 hours. Anemia Panel: No results for input(s): VITAMINB12, FOLATE, FERRITIN, TIBC, IRON , RETICCTPCT in the last 72 hours. Urine analysis:    Component Value Date/Time   COLORURINE YELLOW 12/30/2023 1305   APPEARANCEUR CLOUDY (A) 12/30/2023 1305   LABSPEC 1.011 12/30/2023 1305   PHURINE 6.0 12/30/2023 1305   GLUCOSEU 50 (A) 12/30/2023 1305   HGBUR SMALL (A) 12/30/2023 1305   BILIRUBINUR NEGATIVE 12/30/2023 1305   KETONESUR NEGATIVE 12/30/2023 1305   PROTEINUR 100 (A) 12/30/2023 1305   UROBILINOGEN 0.2 03/02/2010 2027   NITRITE NEGATIVE 12/30/2023 1305   LEUKOCYTESUR LARGE (A) 12/30/2023 1305   Sepsis Labs: @LABRCNTIP (procalcitonin:4,lacticidven:4)  ) Recent Results (from the past 240 hours)  Resp panel by RT-PCR (RSV, Flu A&B, Covid) Anterior Nasal Swab     Status: None   Collection Time:  03/24/24  1:50 AM   Specimen: Anterior Nasal Swab  Result Value Ref Range Status   SARS Coronavirus 2 by RT PCR NEGATIVE NEGATIVE Final   Influenza A by PCR NEGATIVE NEGATIVE Final   Influenza B by PCR NEGATIVE NEGATIVE Final    Comment: (NOTE) The Xpert Xpress SARS-CoV-2/FLU/RSV plus assay is intended as an aid in the diagnosis of influenza from Nasopharyngeal swab specimens and should not be used as a sole basis for treatment. Nasal washings and aspirates are unacceptable for Xpert Xpress SARS-CoV-2/FLU/RSV testing.  Fact Sheet for Patients: bloggercourse.com  Fact Sheet for Healthcare Providers: seriousbroker.it  This test is not yet approved or cleared by the [place]  FDA and has been authorized for detection and/or diagnosis of SARS-CoV-2 by FDA under an Emergency Use Authorization (EUA). This EUA will remain in effect (meaning this test can be used) for the duration of the COVID-19 declaration under Section 564(b)(1) of the Act, 21 U.S.C. section 360bbb-3(b)(1), unless the authorization is terminated or revoked.     Resp Syncytial  Virus by PCR NEGATIVE NEGATIVE Final    Comment: (NOTE) Fact Sheet for Patients: bloggercourse.com  Fact Sheet for Healthcare Providers: seriousbroker.it  This test is not yet approved or cleared by the [place]  FDA and has been authorized for detection and/or diagnosis of SARS-CoV-2 by FDA under an Emergency Use Authorization (EUA). This EUA will remain in effect (meaning this test can be used) for the duration of the COVID-19 declaration under Section 564(b)(1) of the Act, 21 U.S.C. section 360bbb-3(b)(1), unless the authorization is terminated or revoked.  Performed at [facility], [address], [place], [place] [address]   MRSA Next Gen by PCR, Nasal     Status: None   Collection Time: 03/26/24  9:03 AM   Specimen:  Nasal Mucosa; Nasal Swab  Result Value Ref Range Status   MRSA by PCR Next Gen NOT DETECTED NOT DETECTED Final    Comment: (NOTE) The GeneXpert MRSA Assay (FDA approved for NASAL specimens only), is one component of a comprehensive MRSA colonization surveillance program. It is not intended to diagnose MRSA infection nor to guide or monitor treatment for MRSA infections. Test performance is not FDA approved in patients less than 80 years old. Performed at [facility], [address], [place], [place] [address]      Radiology Studies: DG CHEST PORT 1 VIEW Result Date: 03/31/2024 EXAM: 1 VIEW(S) XRAY OF THE CHEST 03/31/2024 06:42:00 AM COMPARISON: 03/30/2024 CLINICAL HISTORY: The patient presents with volume excess. FINDINGS: LUNGS AND PLEURA: No focal pulmonary opacity. Decreased small left pleural effusion. Similar small right pleural effusion. Increased interstitial markings, consistent with pulmonary edema. No pneumothorax. HEART AND MEDIASTINUM: Cardiomegaly. BONES AND SOFT TISSUES: No acute osseous abnormality. IMPRESSION: 1. Increased interstitial markings, consistent with pulmonary edema. 2. Decreased left pleural effusion and similar small right pleural effusion. Electronically signed by: Waddell Calk MD 03/31/2024 07:49 AM EST RP Workstation: [id number]   DG Chest Port 1 View Result Date: 03/30/2024 EXAM: 1 VIEW(S) XRAY OF THE CHEST 03/30/2024 08:50:00 PM COMPARISON: 03/23/2024 CLINICAL HISTORY: The patient presents with acute respiratory distress. ICD10 code 33497: Acute respiratory distress. FINDINGS: LUNGS AND PLEURA: Moderate left and small right pleural effusions, similar to prior exam. Stable retrocardiac consolidation. Mild, progressive interstitial pulmonary edema. No pneumothorax. HEART AND MEDIASTINUM: Cardiomegaly. BONES AND SOFT TISSUES: No acute osseous abnormality. IMPRESSION: 1. Mild, progressive interstitial pulmonary edema. 2. Moderate left and small right pleural  effusions, similar to prior exam. 3. Stable retrocardiac consolidation. 4. Cardiomegaly. Electronically signed by: Dorethia Molt MD 03/30/2024 08:52 PM EST RP Workstation: [id number]     Scheduled Meds:  sodium chloride    Intravenous Once   amLODipine   10 mg Oral Daily   atorvastatin   10 mg Oral Daily   budesonide -glycopyrrolate -formoterol   2 puff Inhalation BID   busPIRone   5 mg Oral BID   calcitRIOL   0.25 mcg Oral Daily   carvedilol   6.25 mg Oral BID WC   Chlorhexidine  Gluconate Cloth  6 each Topical Q0600   darbepoetin (ARANESP ) injection - DIALYSIS  60 mcg Subcutaneous Q Wed-1800   folic acid   1 mg Oral Daily   [START ON 04/02/2024] gabapentin   100 mg Oral QHS   heparin  injection (subcutaneous)  5,000 Units Subcutaneous Q8H   multivitamin with minerals  1 tablet Oral Daily   pantoprazole   40 mg Oral BID   thiamine   100 mg Oral Daily   torsemide   100 mg Oral Daily   Continuous Infusions:     LOS: 8  days    Time spent:    Sigurd Pac, MD [facility]   04/01/2024, 12:19 PM    "

## 2024-04-01 NOTE — Progress Notes (Signed)
 " [facility] Progress Note   Subjective:    Seen and examined patient  in room. She is agitated but off BIPAP, upset about not having a bathroom in the room. Had difficulty maintaining bipap yesterday due to agitation  Objective Vitals:   03/31/24 1942 04/01/24 0059 04/01/24 0459 04/01/24 0714  BP:    (!) 134/53  Pulse: 67 72 71 80  Resp: (!) 31 (!) 30 (!) 27 20  Temp:    98.9 F (37.2 C)  TempSrc:    Axillary  SpO2: 100% 100% 99% 99%  Weight:      Height:       Physical Exam General: ill appearing Heart: RRR Lungs: Diminished posteriorly throughout, on 5L Abdomen: distended Extremities: Trace LE edema Dialysis Access: RU AVF +b   Filed Weights   03/31/24 0330 03/31/24 0814 03/31/24 1238  Weight: 67.6 kg 67.3 kg 63.7 kg    Intake/Output Summary (Last 24 hours) at 04/01/2024 0841 Last data filed at 03/31/2024 1238 Gross per 24 hour  Intake --  Output 4000 ml  Net -4000 ml    Additional Objective Labs: Basic Metabolic Panel: Recent Labs  Lab 03/29/24 0622 03/31/24 0833 03/31/24 0834 04/01/24 0705  NA 129* 128* 127* 132*  K 4.1 4.4 4.4 4.1  CL 90* 90* 90* 94*  CO2 27 27 26 28   GLUCOSE 95 81 81 86  BUN 36* 34* 34* 20  CREATININE 3.58* 3.64* 3.62* 2.59*  CALCIUM  8.1* 8.4* 8.3* 8.5*  PHOS 5.9*  --  5.3*  --    Liver Function Tests: Recent Labs  Lab 03/29/24 0622 03/31/24 0833 03/31/24 0834  AST 16 16  --   ALT 16 15  --   ALKPHOS 111 105  --   BILITOT 0.3 0.3  --   PROT 6.4* 6.6  --   ALBUMIN  3.5 3.6 3.5   No results for input(s): LIPASE, AMYLASE in the last 168 hours. CBC: Recent Labs  Lab 03/28/24 1103 03/31/24 0833 04/01/24 0257  WBC 5.9 8.8 6.7  NEUTROABS  --  7.6  --   HGB 8.2* 7.4* 7.4*  HCT 24.7* 23.5* 23.5*  MCV 93.9 100.0 98.3  PLT 331 255 259   Blood Culture    Component Value Date/Time   SDES PERITONEAL 01/02/2024 1152   SDES PERITONEAL 01/02/2024 1152   SPECREQUEST NONE 01/02/2024 1152   SPECREQUEST  NONE 01/02/2024 1152   CULT  01/02/2024 1152    NO GROWTH 5 DAYS Performed at [facility], [address] [place], [place] [address]    REPTSTATUS 01/07/2024 FINAL 01/02/2024 1152   REPTSTATUS 01/02/2024 FINAL 01/02/2024 1152    Cardiac Enzymes: No results for input(s): CKTOTAL, CKMB, CKMBINDEX, TROPONINI in the last 168 hours. CBG: No results for input(s): GLUCAP in the last 168 hours. Iron  Studies:  No results for input(s): IRON , TIBC, TRANSFERRIN, FERRITIN in the last 72 hours.  Lab Results  Component Value Date   INR 1.1 03/10/2024   INR 1.1 12/30/2023   INR 1.1 05/16/2023   Studies/Results: DG CHEST PORT 1 VIEW Result Date: 03/31/2024 EXAM: 1 VIEW(S) XRAY OF THE CHEST 03/31/2024 06:42:00 AM COMPARISON: 03/30/2024 CLINICAL HISTORY: The patient presents with volume excess. FINDINGS: LUNGS AND PLEURA: No focal pulmonary opacity. Decreased small left pleural effusion. Similar small right pleural effusion. Increased interstitial markings, consistent with pulmonary edema. No pneumothorax. HEART AND MEDIASTINUM: Cardiomegaly. BONES AND SOFT TISSUES: No acute osseous abnormality. IMPRESSION: 1. Increased interstitial markings, consistent with  pulmonary edema. 2. Decreased left pleural effusion and similar small right pleural effusion. Electronically signed by: Waddell Calk MD 03/31/2024 07:49 AM EST RP Workstation: [id number]   DG Chest Port 1 View Result Date: 03/30/2024 EXAM: 1 VIEW(S) XRAY OF THE CHEST 03/30/2024 08:50:00 PM COMPARISON: 03/23/2024 CLINICAL HISTORY: The patient presents with acute respiratory distress. ICD10 code 33497: Acute respiratory distress. FINDINGS: LUNGS AND PLEURA: Moderate left and small right pleural effusions, similar to prior exam. Stable retrocardiac consolidation. Mild, progressive interstitial pulmonary edema. No pneumothorax. HEART AND MEDIASTINUM: Cardiomegaly. BONES AND SOFT TISSUES: No acute osseous abnormality. IMPRESSION: 1.  Mild, progressive interstitial pulmonary edema. 2. Moderate left and small right pleural effusions, similar to prior exam. 3. Stable retrocardiac consolidation. 4. Cardiomegaly. Electronically signed by: Dorethia Molt MD 03/30/2024 08:52 PM EST RP Workstation: [id number]    Medications:    sodium chloride    Intravenous Once   amLODipine   10 mg Oral Daily   atorvastatin   10 mg Oral Daily   budesonide -glycopyrrolate -formoterol   2 puff Inhalation BID   busPIRone   5 mg Oral TID   calcitRIOL   0.25 mcg Oral Daily   carvedilol   6.25 mg Oral BID WC   Chlorhexidine  Gluconate Cloth  6 each Topical Q0600   darbepoetin (ARANESP ) injection - DIALYSIS  60 mcg Subcutaneous Q Wed-1800   folic acid   1 mg Oral Daily   gabapentin   100 mg Oral TID   heparin  injection (subcutaneous)  5,000 Units Subcutaneous Q8H   multivitamin with minerals  1 tablet Oral Daily   pantoprazole   40 mg Oral BID   thiamine   100 mg Oral Daily   torsemide   100 mg Oral Daily    Dialysis Orders: from 2024 -->  3h  B350 Heparin  none  RUA AVF No dialysis center currently    Assessment/Plan: 1. Volume overload, AHRF - CXR on 1/6 with vascular congestion, pleural effusion and atelectasis.  +hypoxia on 3L O2. +Ascites. Pulm edema on cxr 1/14, UF as tolerated, extra UF ONLY treatment today   2. Ascites  - s/p paracentesis with net UF .  3. ESRD - Does not have regular HD center.  -HD on MWF schedule, UF only treatment today then HD tomorrow             -renal navigator following 4. Anemia of CKD- Hgb 7.4, Venofer  x 2 ordered, ESA start 1/14 5. Secondary hyperparathyroidism - Ca a little low. Calcitriol  started.  Using increased Ca bath.  Phos acceptable, start binders if indicated.  6. HTN/volume - Blood pressure slowly coming down with HD, cont to lower volume w/ HD as tolerated 7. Nutrition - On regular diet, monitor labs.  Added fluid restrictions . 8. COPD - continue home meds.   Discussed with primary  service.  Ephriam Stank, MD [facility] 04/01/2024,8:41 AM  LOS: 8 days    "

## 2024-04-02 ENCOUNTER — Inpatient Hospital Stay (HOSPITAL_COMMUNITY)

## 2024-04-02 HISTORY — PX: IR PARACENTESIS: IMG2679

## 2024-04-02 LAB — CBC
HCT: 22.6 % — ABNORMAL LOW (ref 36.0–46.0)
Hemoglobin: 7 g/dL — ABNORMAL LOW (ref 12.0–15.0)
MCH: 30.4 pg (ref 26.0–34.0)
MCHC: 31 g/dL (ref 30.0–36.0)
MCV: 98.3 fL (ref 80.0–100.0)
Platelets: 257 K/uL (ref 150–400)
RBC: 2.3 MIL/uL — ABNORMAL LOW (ref 3.87–5.11)
RDW: 17.5 % — ABNORMAL HIGH (ref 11.5–15.5)
WBC: 7.6 K/uL (ref 4.0–10.5)
nRBC: 0 % (ref 0.0–0.2)

## 2024-04-02 LAB — IRON AND TIBC
Iron: 85 ug/dL (ref 28–170)
Saturation Ratios: 28 % (ref 10.4–31.8)
TIBC: 300 ug/dL (ref 250–450)
UIBC: 215 ug/dL

## 2024-04-02 LAB — FERRITIN: Ferritin: 904 ng/mL — ABNORMAL HIGH (ref 11–307)

## 2024-04-02 LAB — RETICULOCYTES
Immature Retic Fract: 27.3 % — ABNORMAL HIGH (ref 2.3–15.9)
RBC.: 2.46 MIL/uL — ABNORMAL LOW (ref 3.87–5.11)
Retic Count, Absolute: 50.7 K/uL (ref 19.0–186.0)
Retic Ct Pct: 2.1 % (ref 0.4–3.1)

## 2024-04-02 LAB — BASIC METABOLIC PANEL WITH GFR
Anion gap: 11 (ref 5–15)
BUN: 23 mg/dL (ref 8–23)
CO2: 26 mmol/L (ref 22–32)
Calcium: 8.2 mg/dL — ABNORMAL LOW (ref 8.9–10.3)
Chloride: 90 mmol/L — ABNORMAL LOW (ref 98–111)
Creatinine, Ser: 2.25 mg/dL — ABNORMAL HIGH (ref 0.44–1.00)
GFR, Estimated: 24 mL/min — ABNORMAL LOW
Glucose, Bld: 105 mg/dL — ABNORMAL HIGH (ref 70–99)
Potassium: 3.5 mmol/L (ref 3.5–5.1)
Sodium: 128 mmol/L — ABNORMAL LOW (ref 135–145)

## 2024-04-02 LAB — VITAMIN B12: Vitamin B-12: 865 pg/mL (ref 180–914)

## 2024-04-02 LAB — FOLATE: Folate: 6.3 ng/mL

## 2024-04-02 MED ORDER — ALBUMIN HUMAN 25 % IV SOLN
50.0000 g | Freq: Four times a day (QID) | INTRAVENOUS | Status: AC
  Administered 2024-04-02: 50 g via INTRAVENOUS
  Filled 2024-04-02: qty 200

## 2024-04-02 MED ORDER — ALBUMIN HUMAN 25 % IV SOLN: 50.0000 g | Freq: Four times a day (QID) | INTRAVENOUS | Status: DC

## 2024-04-02 MED ORDER — HYDROMORPHONE HCL 2 MG PO TABS
2.0000 mg | ORAL_TABLET | Freq: Once | ORAL | Status: AC | PRN
  Administered 2024-04-02: 2 mg via ORAL
  Filled 2024-04-02: qty 1

## 2024-04-02 MED ORDER — LORAZEPAM 2 MG/ML IJ SOLN
INTRAMUSCULAR | Status: AC
  Filled 2024-04-02: qty 1

## 2024-04-02 MED ORDER — DARBEPOETIN ALFA 150 MCG/0.3ML IJ SOSY
150.0000 ug | PREFILLED_SYRINGE | INTRAMUSCULAR | Status: DC
  Administered 2024-04-07 – 2024-04-14 (×2): 150 ug via SUBCUTANEOUS
  Filled 2024-04-02 (×2): qty 0.3

## 2024-04-02 MED ORDER — LORAZEPAM 2 MG/ML IJ SOLN
1.0000 mg | INTRAMUSCULAR | Status: AC | PRN
  Administered 2024-04-02 – 2024-04-03 (×2): 1 mg via INTRAVENOUS
  Filled 2024-04-02 (×2): qty 1

## 2024-04-02 MED ORDER — OXYCODONE HCL 5 MG PO TABS
10.0000 mg | ORAL_TABLET | Freq: Once | ORAL | Status: AC
  Administered 2024-04-02: 10 mg via ORAL
  Filled 2024-04-02: qty 2

## 2024-04-02 MED ORDER — LIDOCAINE HCL 1 % IJ SOLN
INTRAMUSCULAR | Status: AC
  Filled 2024-04-02: qty 20

## 2024-04-02 MED ORDER — LIDOCAINE HCL 1 % IJ SOLN
20.0000 mL | Freq: Once | INTRAMUSCULAR | Status: AC
  Administered 2024-04-02: 10 mL

## 2024-04-02 NOTE — Progress Notes (Signed)
 "  [facility] Progress Note   Subjective:    Seen and examined patient  on dialysis. Limited UF yesterday, net uf 1.8L. Patient reports that her breathing is MUCH better. More oriented for me. She wants to go home. Currently residing at her ex's. Wants daughters to be her beneficiaries. These things are out of my wheelhouse. Discussed with renal navigator who will get in touch with CM. Needs OP HD plan therefore will try for CLIP again.  Objective Vitals:   04/02/24 0900 04/02/24 0904 04/02/24 0930 04/02/24 1000  BP: 139/68 139/68 (!) 165/68 (!) 159/62  Pulse: 80 82 83 77  Resp: 17 (!) 21 19 18   Temp:      TempSrc:      SpO2: 99% 100% 99% 100%  Weight:      Height:       Physical Exam General: ill appearing Heart: RRR Lungs: normal wob, speaking in full sentences Abdomen: distended Extremities: Trace LE edema Dialysis Access: RU AVF +b   Filed Weights   03/31/24 1238 04/02/24 0438 04/02/24 0820  Weight: 63.7 kg 58.8 kg 58.8 kg    Intake/Output Summary (Last 24 hours) at 04/02/2024 1027 Last data filed at 04/02/2024 0841 Gross per 24 hour  Intake 2537 ml  Output 1800 ml  Net 737 ml    Additional Objective Labs: Basic Metabolic Panel: Recent Labs  Lab 03/29/24 0622 03/31/24 0833 03/31/24 0834 04/01/24 0705 04/02/24 0355  NA 129*   < > 127* 132* 128*  K 4.1   < > 4.4 4.1 3.5  CL 90*   < > 90* 94* 90*  CO2 27   < > 26 28 26   GLUCOSE 95   < > 81 86 105*  BUN 36*   < > 34* 20 23  CREATININE 3.58*   < > 3.62* 2.59* 2.25*  CALCIUM  8.1*   < > 8.3* 8.5* 8.2*  PHOS 5.9*  --  5.3*  --   --    < > = values in this interval not displayed.   Liver Function Tests: Recent Labs  Lab 03/29/24 0622 03/31/24 0833 03/31/24 0834  AST 16 16  --   ALT 16 15  --   ALKPHOS 111 105  --   BILITOT 0.3 0.3  --   PROT 6.4* 6.6  --   ALBUMIN  3.5 3.6 3.5   No results for input(s): LIPASE, AMYLASE in the last 168 hours. CBC: Recent Labs  Lab  03/28/24 1103 03/31/24 0833 04/01/24 0257 04/02/24 0355  WBC 5.9 8.8 6.7 7.6  NEUTROABS  --  7.6  --   --   HGB 8.2* 7.4* 7.4* 7.0*  HCT 24.7* 23.5* 23.5* 22.6*  MCV 93.9 100.0 98.3 98.3  PLT 331 255 259 257   Blood Culture    Component Value Date/Time   SDES PERITONEAL 01/02/2024 1152   SDES PERITONEAL 01/02/2024 1152   SPECREQUEST NONE 01/02/2024 1152   SPECREQUEST NONE 01/02/2024 1152   CULT  01/02/2024 1152    NO GROWTH 5 DAYS Performed at [facility], [address], [place], [place] [address]    REPTSTATUS 01/07/2024 FINAL 01/02/2024 1152   REPTSTATUS 01/02/2024 FINAL 01/02/2024 1152    Cardiac Enzymes: No results for input(s): CKTOTAL, CKMB, CKMBINDEX, TROPONINI in the last 168 hours. CBG: No results for input(s): GLUCAP in the last 168 hours. Iron  Studies:  No results for input(s): IRON , TIBC, TRANSFERRIN, FERRITIN in the last 72 hours.  Lab Results  Component Value Date   INR 1.1 03/10/2024   INR 1.1 12/30/2023   INR 1.1 05/16/2023   Studies/Results: No results found.   Medications:    sodium chloride    Intravenous Once   amLODipine   10 mg Oral Daily   atorvastatin   10 mg Oral Daily   budesonide -glycopyrrolate -formoterol   2 puff Inhalation BID   busPIRone   5 mg Oral BID   calcitRIOL   0.25 mcg Oral Daily   carvedilol   6.25 mg Oral BID WC   Chlorhexidine  Gluconate Cloth  6 each Topical Q0600   darbepoetin (ARANESP ) injection - DIALYSIS  60 mcg Subcutaneous Q Wed-1800   folic acid   1 mg Oral Daily   gabapentin   100 mg Oral QHS   heparin  injection (subcutaneous)  5,000 Units Subcutaneous Q8H   multivitamin with minerals  1 tablet Oral Daily   pantoprazole   40 mg Oral BID   thiamine   100 mg Oral Daily   torsemide   100 mg Oral Daily    Dialysis Orders: from 2024 -->  3h  B350 Heparin  none  RUA AVF No dialysis center currently    Assessment/Plan: 1. Volume overload, AHRF - CXR on 1/6 with vascular congestion,  pleural effusion and atelectasis.  +hypoxia on 3L O2. +Ascites. Pulm edema on cxr 1/14, UF as tolerated-improving  2. Ascites  - s/p paracentesis with UF removed on 1/9 3. ESRD - Does not have regular HD center, has historically been very noncompliant with HD hence no OP HD unit ([facility])  -HD on MWF schedule, next HD on Monday but will reassess             -renal navigator following, see above 4. Anemia of CKD- Hgb 7.0, Venofer  x 2 ordered, ESA started 1/14--dose increased 5. Secondary hyperparathyroidism - Ca a little low. Calcitriol  started.  Using increased Ca bath.  Phos acceptable, start binders if indicated.  6. HTN/volume - Blood pressure slowly coming down with HD, cont to lower volume w/ HD as tolerated 7. Nutrition - On regular diet, monitor labs.  fluid restriction . 8. COPD - continue home meds.   Discussed with renal navigator, appreciate assistance to determine dispo. See above  Ephriam Stank, MD [facility] 04/02/2024,10:27 AM  LOS: 9 days    "

## 2024-04-02 NOTE — Progress Notes (Signed)
 VAST consult. Pt up in BS chair eating dinner. Declined PIV at this time. Notified RN. Powell Bowler, RN VAST

## 2024-04-02 NOTE — Progress Notes (Signed)
 Speech Language Pathology Treatment: Dysphagia  Patient Details Name: [name] MRN: [id number] DOB: 01-May-1960 Today's Date: 04/02/2024 Time: 8388-8373 SLP Time Calculation (min) (ACUTE ONLY): 15 min  Assessment / Plan / Recommendation Clinical Impression  Pt was seen during lunch meal. Note that her diet has been upgraded since initial SLP eval and she is back on regular texture solids as opposed to mechanical soft. Pt cut her food into smaller pieces herself prior to initiating PO intake and took her time for mastication, needing extra time given edentulous status. She has no overt signs of aspiration across self-feeding with meal.   PLAN: Continue with regular solids and thin liquids. Education reviewed regarding general aspiration precautions. SLP to sign off - please reorder with any acute concerns.   HPI HPI: [name] is a 64 y.o. female who presented to the hospital on 03/24/24 with c/o chest pain and SOB. She was admitted to the hospital last month for volume overload and has not gone for dialysis since being discharged two weeks prior to this admission. CXR reported bibasilar opacities, favor atelectasis. SLP swallow evaluation ordered on 03/30/24 secondary to patient observed to be coughing when drinking liquids by RN. PMH: COPD, GERD, anxiety/depression, HLD, HTN, chronic pain/neuropathy, unhoused, ESRD on HD, polysubstance abuse.      SLP Plan  All goals met        Swallow Evaluation Recommendations   Recommendations: PO diet PO Diet Recommendation: Regular;Thin liquids (Level 0) Liquid Administration via: Cup;Straw Medication Administration: Whole meds with liquid Supervision: Patient able to self-feed;Intermittent supervision/cueing for swallowing strategies Postural changes: Position pt fully upright for meals Oral care recommendations: Oral care BID (2x/day)     Recommendations                           Dysphagia, oral phase (R13.11)     All  goals met     Leita SAILOR., M.A. CCC-SLP Acute Rehabilitation Services Office: [phone]  Secure chat preferred   04/02/2024, 4:33 PM

## 2024-04-02 NOTE — Progress Notes (Signed)
 Pt's case discussed with nephrologist. Pt currently does not have an out-pt HD clinic due to pt not being agreeable to out-pt HD due to social barriers. Pt has been d/c from 2 local HD clinics in the past due to non-compliance and it is unknown if any local clinic would be agreeable to accept pt. Contacted RN CM to inquire further about pt's d/c plan. Due to pt being un housed, navigator will be unable to re-clip pt until there is a confirmed d/c plan/destination known (whether that be someone's home vs homeless shelter). Navigator can assist with new HD referral if pt agreeable to out-pt HD and if there is a confirmed d/c plan. Will assist as needed.   Randine Mungo Dialysis Navigator [phone]

## 2024-04-02 NOTE — Procedures (Signed)
 I was present at this dialysis session. I have reviewed the session itself and made appropriate changes.   Filed Weights   03/31/24 1238 04/02/24 0438 04/02/24 0820  Weight: 63.7 kg 58.8 kg 58.8 kg    Recent Labs  Lab 03/31/24 0834 04/01/24 0705 04/02/24 0355  NA 127*   < > 128*  K 4.4   < > 3.5  CL 90*   < > 90*  CO2 26   < > 26  GLUCOSE 81   < > 105*  BUN 34*   < > 23  CREATININE 3.62*   < > 2.25*  CALCIUM  8.3*   < > 8.2*  PHOS 5.3*  --   --    < > = values in this interval not displayed.    Recent Labs  Lab 03/31/24 0833 04/01/24 0257 04/02/24 0355  WBC 8.8 6.7 7.6  NEUTROABS 7.6  --   --   HGB 7.4* 7.4* 7.0*  HCT 23.5* 23.5* 22.6*  MCV 100.0 98.3 98.3  PLT 255 259 257    Scheduled Meds:  sodium chloride    Intravenous Once   amLODipine   10 mg Oral Daily   atorvastatin   10 mg Oral Daily   budesonide -glycopyrrolate -formoterol   2 puff Inhalation BID   busPIRone   5 mg Oral BID   calcitRIOL   0.25 mcg Oral Daily   carvedilol   6.25 mg Oral BID WC   Chlorhexidine  Gluconate Cloth  6 each Topical Q0600   darbepoetin (ARANESP ) injection - DIALYSIS  60 mcg Subcutaneous Q Wed-1800   folic acid   1 mg Oral Daily   gabapentin   100 mg Oral QHS   heparin  injection (subcutaneous)  5,000 Units Subcutaneous Q8H   multivitamin with minerals  1 tablet Oral Daily   pantoprazole   40 mg Oral BID   thiamine   100 mg Oral Daily   torsemide   100 mg Oral Daily   Continuous Infusions: PRN Meds:.acetaminophen  **OR** acetaminophen , ipratropium-albuterol , LORazepam , mouth rinse   Ephriam Stank, MD [facility] 04/02/2024, 10:27 AM

## 2024-04-02 NOTE — Progress Notes (Signed)
" °   04/02/24 1322  Vitals  Temp 98 F (36.7 C)  Pulse Rate 72  Resp 19  BP (!) 145/64  SpO2 99 %  O2 Device HFNC  Weight 54.4 kg  Type of Weight Post-Dialysis  Oxygen  Therapy  O2 Flow Rate (L/min) 4 L/min  Patient Activity (if Appropriate) In bed  Pulse Oximetry Type Continuous  Oximetry Probe Site Changed No  Post Treatment  Dialyzer Clearance Lightly streaked  Hemodialysis Intake (mL) 0 mL  Liters Processed 69.2  Fluid Removed (mL) 3600 mL  Tolerated HD Treatment Yes  AVG/AVF Arterial Site Held (minutes) 10 minutes  AVG/AVF Venous Site Held (minutes) 10 minutes   Received patient in bed to unit.  Alert and oriented.  Informed consent signed and in chart.   TX duration:3.5  Patient tolerated well.  Transported back to the room  Alert, without acute distress.  Hand-off given to patient's nurse.   Access used: RUAF Access issues: increased bleeding to arterial access site--approx 15-20 minutes to establish cessation  Total UF removed: 3400 Medication(s) given: ativan  1mg  iv x 1   Delon LITTIE [facility] "

## 2024-04-02 NOTE — Procedures (Signed)
 PROCEDURE SUMMARY:  Successful image-guided paracentesis from the right abdomen.  Yielded 1.5 liters of clear, blood tinged peritoneal fluid.  No immediate complications.  EBL: zero Patient tolerated well.   Please see imaging section of Epic for full dictation.  Carlin DELENA Griffon PA-C 04/02/2024 3:47 PM

## 2024-04-02 NOTE — Progress Notes (Addendum)
 " PROGRESS NOTE    [name]  FMW:[id number] DOB: 1960-09-09 DOA: 03/23/2024 PCP: Pcp, No   64/F w COPD supposed to be on home oxygen , ESRD on HD, noncompliance due to homelessness, polysubstance abuse, diastolic CHF, hypertension, dyslipidemia, anxiety, cirrhosis, ascites back in the ED with chest pain and shortness of breath.  Patient is a poor historian.  She is reporting substernal chest heaviness and shortness of breath for several days.  She was admitted to the hospital last month for volume overload and has not gone for dialysis since she was discharged 2 weeks ago.   - Admitted, started on hemodialysis, volume status improving - 1/13 -14 noted to have worsening agitation and confusion, ABG with severe respiratory acidosis, placed on BiPAP, chest x-ray with worsening pulmonary edema -has been refusing some HD Rx even in the hospital and cutting HD short  Subjective: - Pt seen in HD, much more alert awake and appropriate  Assessment and Plan:  Acute on chronic hypoxic and hypercarbic respiratory failure ESRD on HD, volume overload, pulmonary edema Metabolic encephalopathy Noncompliance with HD -Worsening volume overload, pulmonary edema and pleural effusions on repeat x-ray again yesterday morning, has been refusing and cutting short some HD rx in ths Hospital as well - Volume status improving, seen on HD, plan for paracentesis - Continue to lower dry weight as tolerated  Transient chest pain Resolved Troponin minimally elevated, no ACS Echo with preserved EF, global hypokinesis  Liver cirrhosis History of EtOH abuse, ascites -Underwent paracentesis 1/9, 675 mL fluid drained, - Reattempt paracentesis today  Chronic anemia Hemoglobin trending down further, check anemia panel and CBC in a.m., may need transfusion soon  COPD/chronic respiratory failure Supposed to be on home oxygen  however noncompliant with homelessness - Continue current inhalers, supplemental  O2  Hypertension Continue amlodipine  and Coreg   Anxiety/depression Continue BuSpar , dose decreased  Chronic pain/neuropathy Continue gabapentin , decrease dose   DVT prophylaxis: Heparin  subcutaneous Code Status: DNR Family Communication: No immediate family contacts Disposition Plan: Unknown  Consultants:    Procedures:   Antimicrobials:    Objective: Vitals:   04/02/24 0930 04/02/24 1000 04/02/24 1030 04/02/24 1100  BP: (!) 165/68 (!) 159/62 (!) 148/101 (!) 144/65  Pulse: 83 77 79 77  Resp: 19 18 (!) 21 17  Temp:      TempSrc:      SpO2: 99% 100% 100% 100%  Weight:      Height:        Intake/Output Summary (Last 24 hours) at 04/02/2024 1130 Last data filed at 04/02/2024 0841 Gross per 24 hour  Intake 2537 ml  Output 1800 ml  Net 737 ml   Filed Weights   03/31/24 1238 04/02/24 0438 04/02/24 0820  Weight: 63.7 kg 58.8 kg 58.8 kg    Examination:  General exam: Chronically ill appears older than stated age, awake alert oriented to self and place this morning HEENT: Positive JVD CVS: S1-S2, regular rhythm Lungs: Decreased breath sounds to bases, few basilar rales Abdomen: Firm, mildly distended, positive fluid thrill Extremities: 1+ edema, mittens off   Data Reviewed:   CBC: Recent Labs  Lab 03/28/24 1103 03/31/24 0833 04/01/24 0257 04/02/24 0355  WBC 5.9 8.8 6.7 7.6  NEUTROABS  --  7.6  --   --   HGB 8.2* 7.4* 7.4* 7.0*  HCT 24.7* 23.5* 23.5* 22.6*  MCV 93.9 100.0 98.3 98.3  PLT 331 255 259 257   Basic Metabolic Panel: Recent Labs  Lab 03/29/24 0622 03/31/24 0833 03/31/24  9165 04/01/24 0705 04/02/24 0355  NA 129* 128* 127* 132* 128*  K 4.1 4.4 4.4 4.1 3.5  CL 90* 90* 90* 94* 90*  CO2 27 27 26 28 26   GLUCOSE 95 81 81 86 105*  BUN 36* 34* 34* 20 23  CREATININE 3.58* 3.64* 3.62* 2.59* 2.25*  CALCIUM  8.1* 8.4* 8.3* 8.5* 8.2*  PHOS 5.9*  --  5.3*  --   --    GFR: Estimated Creatinine Clearance: 23.8 mL/min (A) (by C-G formula based  on SCr of 2.25 mg/dL (H)). Liver Function Tests: Recent Labs  Lab 03/29/24 0622 03/31/24 0833 03/31/24 0834  AST 16 16  --   ALT 16 15  --   ALKPHOS 111 105  --   BILITOT 0.3 0.3  --   PROT 6.4* 6.6  --   ALBUMIN  3.5 3.6 3.5   No results for input(s): LIPASE, AMYLASE in the last 168 hours. No results for input(s): AMMONIA in the last 168 hours. Coagulation Profile: No results for input(s): INR, PROTIME in the last 168 hours. Cardiac Enzymes: No results for input(s): CKTOTAL, CKMB, CKMBINDEX, TROPONINI in the last 168 hours. BNP (last 3 results) Recent Labs    03/10/24 0055 03/13/24 2100 03/23/24 1850  PROBNP >35,000.0* >35,000.0* >35,000.0*   HbA1C: No results for input(s): HGBA1C in the last 72 hours. CBG: No results for input(s): GLUCAP in the last 168 hours. Lipid Profile: No results for input(s): CHOL, HDL, LDLCALC, TRIG, CHOLHDL, LDLDIRECT in the last 72 hours. Thyroid Function Tests: No results for input(s): TSH, T4TOTAL, FREET4, T3FREE, THYROIDAB in the last 72 hours. Anemia Panel: No results for input(s): VITAMINB12, FOLATE, FERRITIN, TIBC, IRON , RETICCTPCT in the last 72 hours. Urine analysis:    Component Value Date/Time   COLORURINE YELLOW 12/30/2023 1305   APPEARANCEUR CLOUDY (A) 12/30/2023 1305   LABSPEC 1.011 12/30/2023 1305   PHURINE 6.0 12/30/2023 1305   GLUCOSEU 50 (A) 12/30/2023 1305   HGBUR SMALL (A) 12/30/2023 1305   BILIRUBINUR NEGATIVE 12/30/2023 1305   KETONESUR NEGATIVE 12/30/2023 1305   PROTEINUR 100 (A) 12/30/2023 1305   UROBILINOGEN 0.2 03/02/2010 2027   NITRITE NEGATIVE 12/30/2023 1305   LEUKOCYTESUR LARGE (A) 12/30/2023 1305   Sepsis Labs: @LABRCNTIP (procalcitonin:4,lacticidven:4)  ) Recent Results (from the past 240 hours)  Resp panel by RT-PCR (RSV, Flu A&B, Covid) Anterior Nasal Swab     Status: None   Collection Time: 03/24/24  1:50 AM   Specimen: Anterior Nasal Swab   Result Value Ref Range Status   SARS Coronavirus 2 by RT PCR NEGATIVE NEGATIVE Final   Influenza A by PCR NEGATIVE NEGATIVE Final   Influenza B by PCR NEGATIVE NEGATIVE Final    Comment: (NOTE) The Xpert Xpress SARS-CoV-2/FLU/RSV plus assay is intended as an aid in the diagnosis of influenza from Nasopharyngeal swab specimens and should not be used as a sole basis for treatment. Nasal washings and aspirates are unacceptable for Xpert Xpress SARS-CoV-2/FLU/RSV testing.  Fact Sheet for Patients: bloggercourse.com  Fact Sheet for Healthcare Providers: seriousbroker.it  This test is not yet approved or cleared by the [place]  FDA and has been authorized for detection and/or diagnosis of SARS-CoV-2 by FDA under an Emergency Use Authorization (EUA). This EUA will remain in effect (meaning this test can be used) for the duration of the COVID-19 declaration under Section 564(b)(1) of the Act, 21 U.S.C. section 360bbb-3(b)(1), unless the authorization is terminated or revoked.     Resp Syncytial Virus by PCR NEGATIVE NEGATIVE Final  Comment: (NOTE) Fact Sheet for Patients: bloggercourse.com  Fact Sheet for Healthcare Providers: seriousbroker.it  This test is not yet approved or cleared by the [place]  FDA and has been authorized for detection and/or diagnosis of SARS-CoV-2 by FDA under an Emergency Use Authorization (EUA). This EUA will remain in effect (meaning this test can be used) for the duration of the COVID-19 declaration under Section 564(b)(1) of the Act, 21 U.S.C. section 360bbb-3(b)(1), unless the authorization is terminated or revoked.  Performed at [facility], [address], [place], [place] [address]   MRSA Next Gen by PCR, Nasal     Status: None   Collection Time: 03/26/24  9:03 AM   Specimen: Nasal Mucosa; Nasal Swab  Result Value Ref Range  Status   MRSA by PCR Next Gen NOT DETECTED NOT DETECTED Final    Comment: (NOTE) The GeneXpert MRSA Assay (FDA approved for NASAL specimens only), is one component of a comprehensive MRSA colonization surveillance program. It is not intended to diagnose MRSA infection nor to guide or monitor treatment for MRSA infections. Test performance is not FDA approved in patients less than 75 years old. Performed at [facility], [address], [place], [place] [address]      Radiology Studies: No results found.    Scheduled Meds:  sodium chloride    Intravenous Once   amLODipine   10 mg Oral Daily   atorvastatin   10 mg Oral Daily   budesonide -glycopyrrolate -formoterol   2 puff Inhalation BID   busPIRone   5 mg Oral BID   calcitRIOL   0.25 mcg Oral Daily   carvedilol   6.25 mg Oral BID WC   Chlorhexidine  Gluconate Cloth  6 each Topical Q0600   [START ON 04/07/2024] darbepoetin (ARANESP ) injection - DIALYSIS  150 mcg Subcutaneous Q Wed-1800   folic acid   1 mg Oral Daily   gabapentin   100 mg Oral QHS   heparin  injection (subcutaneous)  5,000 Units Subcutaneous Q8H   multivitamin with minerals  1 tablet Oral Daily   pantoprazole   40 mg Oral BID   thiamine   100 mg Oral Daily   torsemide   100 mg Oral Daily   Continuous Infusions:     LOS: 9 days    Time spent:    Sigurd Pac, MD [facility]   04/02/2024, 11:30 AM    "

## 2024-04-03 ENCOUNTER — Inpatient Hospital Stay (HOSPITAL_COMMUNITY)

## 2024-04-03 LAB — CBC
HCT: 21.4 % — ABNORMAL LOW (ref 36.0–46.0)
Hemoglobin: 7 g/dL — ABNORMAL LOW (ref 12.0–15.0)
MCH: 31.8 pg (ref 26.0–34.0)
MCHC: 32.7 g/dL (ref 30.0–36.0)
MCV: 97.3 fL (ref 80.0–100.0)
Platelets: 247 K/uL (ref 150–400)
RBC: 2.2 MIL/uL — ABNORMAL LOW (ref 3.87–5.11)
RDW: 17.6 % — ABNORMAL HIGH (ref 11.5–15.5)
WBC: 7.1 K/uL (ref 4.0–10.5)
nRBC: 0 % (ref 0.0–0.2)

## 2024-04-03 LAB — HEMOGLOBIN AND HEMATOCRIT, BLOOD
HCT: 23.7 % — ABNORMAL LOW (ref 36.0–46.0)
Hemoglobin: 7.7 g/dL — ABNORMAL LOW (ref 12.0–15.0)

## 2024-04-03 LAB — PREPARE RBC (CROSSMATCH)

## 2024-04-03 MED ORDER — IOHEXOL 9 MG/ML PO SOLN: 500.0000 mL | ORAL | Status: AC

## 2024-04-03 MED ORDER — OXYCODONE HCL 5 MG PO TABS
5.0000 mg | ORAL_TABLET | Freq: Four times a day (QID) | ORAL | Status: DC | PRN
  Administered 2024-04-03 – 2024-04-04 (×4): 5 mg via ORAL
  Filled 2024-04-03 (×5): qty 1

## 2024-04-03 MED ORDER — SODIUM CHLORIDE 0.9% IV SOLUTION: Freq: Once | INTRAVENOUS | Status: AC

## 2024-04-03 NOTE — Progress Notes (Signed)
 " [name] Progress Note   Subjective:    Seen and examined patient  on dialysis. Tolerated HD yesterday with net uf 3.4L. s/p para yesterday 1.5L yesterday She reports that her breathing is better  Objective Vitals:   04/03/24 0115 04/03/24 0318 04/03/24 0818 04/03/24 0846  BP:   (!) 148/98   Pulse:   71   Resp:  19 19   Temp:  (!) 97.1 F (36.2 C) (!) 97.1 F (36.2 C)   TempSrc:  Oral Oral   SpO2: 94% 96% 93% 94%  Weight:  57.3 kg    Height:       Physical Exam General: chronically ill appearing Heart: RRR Lungs: coarse breath sounds, normal wob, speaking in full sentences Abdomen: distended Extremities: Trace LE edema Dialysis Access: RU AVF +b   Filed Weights   04/02/24 0820 04/02/24 1322 04/03/24 0318  Weight: 58.8 kg 54.4 kg 57.3 kg    Intake/Output Summary (Last 24 hours) at 04/03/2024 0859 Last data filed at 04/03/2024 0320 Gross per 24 hour  Intake --  Output 3525 ml  Net -3525 ml    Additional Objective Labs: Basic Metabolic Panel: Recent Labs  Lab 03/29/24 0622 03/31/24 0833 03/31/24 0834 04/01/24 0705 04/02/24 0355  NA 129*   < > 127* 132* 128*  K 4.1   < > 4.4 4.1 3.5  CL 90*   < > 90* 94* 90*  CO2 27   < > 26 28 26   GLUCOSE 95   < > 81 86 105*  BUN 36*   < > 34* 20 23  CREATININE 3.58*   < > 3.62* 2.59* 2.25*  CALCIUM  8.1*   < > 8.3* 8.5* 8.2*  PHOS 5.9*  --  5.3*  --   --    < > = values in this interval not displayed.   Liver Function Tests: Recent Labs  Lab 03/29/24 0622 03/31/24 0833 03/31/24 0834  AST 16 16  --   ALT 16 15  --   ALKPHOS 111 105  --   BILITOT 0.3 0.3  --   PROT 6.4* 6.6  --   ALBUMIN  3.5 3.6 3.5   No results for input(s): LIPASE, AMYLASE in the last 168 hours. CBC: Recent Labs  Lab 03/28/24 1103 03/31/24 0833 04/01/24 0257 04/02/24 0355 04/03/24 0248  WBC 5.9 8.8 6.7 7.6 7.1  NEUTROABS  --  7.6  --   --   --   HGB 8.2* 7.4* 7.4* 7.0* 7.0*  HCT 24.7* 23.5* 23.5* 22.6* 21.4*   MCV 93.9 100.0 98.3 98.3 97.3  PLT 331 255 259 257 247   Blood Culture    Component Value Date/Time   SDES PERITONEAL 01/02/2024 1152   SDES PERITONEAL 01/02/2024 1152   SPECREQUEST NONE 01/02/2024 1152   SPECREQUEST NONE 01/02/2024 1152   CULT  01/02/2024 1152    NO GROWTH 5 DAYS Performed at [facility], [address], [place], [place] [address]    REPTSTATUS 01/07/2024 FINAL 01/02/2024 1152   REPTSTATUS 01/02/2024 FINAL 01/02/2024 1152    Cardiac Enzymes: No results for input(s): CKTOTAL, CKMB, CKMBINDEX, TROPONINI in the last 168 hours. CBG: No results for input(s): GLUCAP in the last 168 hours. Iron  Studies:  Recent Labs    04/02/24 1427  IRON  85  TIBC 300  FERRITIN 904*    Lab Results  Component Value Date   INR 1.1 03/10/2024   INR 1.1 12/30/2023   INR 1.1 05/16/2023  Studies/Results: IR Paracentesis Result Date: 04/02/2024 INDICATION: 64 year old female with history of half path 4/ESRD, with volume overload, and recurrent ascites. IR was requested for therapeutic paracentesis. EXAM: ULTRASOUND GUIDED THERAPEUTIC PARACENTESIS MEDICATIONS: 4 mL of 1% lidocaine . COMPLICATIONS: None immediate. PROCEDURE: Informed written consent was obtained from the patient after a discussion of the risks, benefits and alternatives to treatment. A timeout was performed prior to the initiation of the procedure. Initial ultrasound scanning demonstrates a moderate amount of ascites within the right lower abdominal quadrant. The right lower abdomen was prepped and draped in the usual sterile fashion. 1% lidocaine  was used for local anesthesia. Following this, a 6 Fr Safe-T-Centesis catheter was introduced. An ultrasound image was saved for documentation purposes. The paracentesis was performed. The catheter was removed and a dressing was applied. The patient tolerated the procedure well without immediate post procedural complication. FINDINGS: A total of approximately 1.5  L of clear, blood-tinged peritoneal fluid was removed. IMPRESSION: Successful ultrasound-guided paracentesis yielding 1.5 liters of peritoneal fluid. Procedure performed Carlin Griffon, PA-C, under the direct supervision of Dr. Cordella Banner, MD. Electronically Signed   By: Cordella Banner   On: 04/02/2024 17:03     Medications:    sodium chloride    Intravenous Once   sodium chloride    Intravenous Once   amLODipine   10 mg Oral Daily   atorvastatin   10 mg Oral Daily   budesonide -glycopyrrolate -formoterol   2 puff Inhalation BID   busPIRone   5 mg Oral BID   calcitRIOL   0.25 mcg Oral Daily   carvedilol   6.25 mg Oral BID WC   Chlorhexidine  Gluconate Cloth  6 each Topical Q0600   [START ON 04/07/2024] darbepoetin (ARANESP ) injection - DIALYSIS  150 mcg Subcutaneous Q Wed-1800   folic acid   1 mg Oral Daily   gabapentin   100 mg Oral QHS   heparin  injection (subcutaneous)  5,000 Units Subcutaneous Q8H   multivitamin with minerals  1 tablet Oral Daily   pantoprazole   40 mg Oral BID   thiamine   100 mg Oral Daily   torsemide   100 mg Oral Daily    Dialysis Orders: from 2024 -->  3h  B350 Heparin  none  RUA AVF No dialysis center currently    Assessment/Plan: Volume overload, AHRF - CXR on 1/6 with vascular congestion, pleural effusion and atelectasis.  +hypoxia on 3L O2. +Ascites. Pulm edema on cxr 1/14, UF as tolerated-improving   Liver cirrhosis, Ascites  - s/p paracentesis with UF removed on 1/9.  Status post paracentesis 1/16 with 1.5 L removed  Chronic hyponatremia - will continue to attempt to manage with HD/UF  ESRD - Does not have regular HD center, has historically been very noncompliant with HD hence no OP HD unit ([facility])  -HD on MWF schedule, next HD on Monday.  No indications for dialysis today             -renal navigator and CM following for dispo  Anemia of CKD- Hgb 7.0, Venofer  x 2 ordered, ESA started 1/14--dose increased. Iron   replete  Secondary hyperparathyroidism - Ca a little low. Calcitriol  started.  Using increased Ca bath.  Phos acceptable, start binders if indicated.   HTN/volume - Blood pressure slowly coming down with HD, cont to lower volume w/ HD as tolerated  Nutrition - On regular diet, monitor labs.  fluid restriction .  COPD - continue home meds.   Ephriam Stank, MD [facility] 04/03/2024,8:59 AM  LOS: 10 days    "

## 2024-04-03 NOTE — Progress Notes (Addendum)
 " PROGRESS NOTE    [name]  FMW:[id number] DOB: [birth year] DOA: 03/23/2024 PCP: Pcp, No   64/F w COPD supposed to be on home oxygen , ESRD on HD, noncompliance due to homelessness, polysubstance abuse, diastolic CHF, hypertension, dyslipidemia, anxiety, cirrhosis, ascites back in the ED with chest pain and shortness of breath.  Patient is a poor historian.  She is reporting substernal chest heaviness and shortness of breath for several days.  She was admitted to the hospital last month for volume overload and has not gone for dialysis since she was discharged 2 weeks ago.   - Admitted, started on hemodialysis, volume status improving - 1/13 -14 noted to have worsening agitation and confusion, ABG with severe respiratory acidosis, placed on BiPAP, chest x-ray with worsening pulmonary edema -has been refusing some HD Rx even in the hospital and cutting HD short - 1/16, respiratory status improving, increased abdominal distention, paracentesis-1.5 L of clear blood-tinged peritoneal fluid drained  Subjective: - Complains of more abdominal distention and pain despite paracentesis yesterday  Assessment and Plan:  Acute on chronic hypoxic and hypercarbic respiratory failure ESRD on HD, volume overload, pulmonary edema Metabolic encephalopathy Noncompliance with HD -Worsening volume overload, pulmonary edema and pleural effusions on repeat x-ray again yesterday morning, has been refusing and cutting short some HD rx in ths Hospital as well -  sp paracentesis 1/16 - Volume status finally improving, weight down over 35 LB  Liver cirrhosis History of EtOH abuse, ascites Abdominal pain -Underwent paracentesis 1/9, 675 mL fluid drained, -sp repeat paracentesis 1/16, only 1.5 L drained, complains of increase pain and distention after this, will obtain CT abdomen pelvis today  Transient chest pain Resolved Troponin minimally elevated, no ACS Echo with preserved EF, global hypokinesis  Chronic  anemia Hemoglobin trending down further, check anemia panel and CBC in a.m., may need transfusion soon  COPD/chronic respiratory failure Supposed to be on home oxygen  however noncompliant with homelessness - Continue current inhalers, supplemental O2  Hypertension Continue amlodipine  and Coreg   Anxiety/depression Continue BuSpar , dose decreased  Chronic pain/neuropathy Continue gabapentin , decrease dose   DVT prophylaxis: Heparin  subcutaneous Code Status: DNR Family Communication: No immediate family contacts Disposition Plan: Unknown  Consultants:    Procedures:   Antimicrobials:    Objective: Vitals:   04/03/24 0115 04/03/24 0318 04/03/24 0818 04/03/24 0846  BP:   (!) 148/98   Pulse:   71 79  Resp:  19 19 18   Temp:  (!) 97.1 F (36.2 C) (!) 97.1 F (36.2 C)   TempSrc:  Oral Oral   SpO2: 94% 96% 93% 94%  Weight:  57.3 kg    Height:        Intake/Output Summary (Last 24 hours) at 04/03/2024 1130 Last data filed at 04/03/2024 0320 Gross per 24 hour  Intake --  Output 3525 ml  Net -3525 ml   Filed Weights   04/02/24 0820 04/02/24 1322 04/03/24 0318  Weight: 58.8 kg 54.4 kg 57.3 kg    Examination:  General exam: Chronically ill appears older than stated 64, awake alert oriented to self and place this morning HEENT: Positive JVD CVS: S1-S2, regular rhythm Lungs: Decreased breath sounds to bases, few basilar rales Abdomen: Firm, mildly distended, positive fluid thrill Extremities: 1+ edema, mittens off   Data Reviewed:   CBC: Recent Labs  Lab 03/28/24 1103 03/31/24 0833 04/01/24 0257 04/02/24 0355 04/03/24 0248  WBC 5.9 8.8 6.7 7.6 7.1  NEUTROABS  --  7.6  --   --   --  HGB 8.2* 7.4* 7.4* 7.0* 7.0*  HCT 24.7* 23.5* 23.5* 22.6* 21.4*  MCV 93.9 100.0 98.3 98.3 97.3  PLT 331 255 259 257 247   Basic Metabolic Panel: Recent Labs  Lab 03/29/24 0622 03/31/24 0833 03/31/24 0834 04/01/24 0705 04/02/24 0355  NA 129* 128* 127* 132* 128*  K  4.1 4.4 4.4 4.1 3.5  CL 90* 90* 90* 94* 90*  CO2 27 27 26 28 26   GLUCOSE 95 81 81 86 105*  BUN 36* 34* 34* 20 23  CREATININE 3.58* 3.64* 3.62* 2.59* 2.25*  CALCIUM  8.1* 8.4* 8.3* 8.5* 8.2*  PHOS 5.9*  --  5.3*  --   --    GFR: Estimated Creatinine Clearance: 23.1 mL/min (A) (by C-G formula based on SCr of 2.25 mg/dL (H)). Liver Function Tests: Recent Labs  Lab 03/29/24 0622 03/31/24 0833 03/31/24 0834  AST 16 16  --   ALT 16 15  --   ALKPHOS 111 105  --   BILITOT 0.3 0.3  --   PROT 6.4* 6.6  --   ALBUMIN  3.5 3.6 3.5   No results for input(s): LIPASE, AMYLASE in the last 168 hours. No results for input(s): AMMONIA in the last 168 hours. Coagulation Profile: No results for input(s): INR, PROTIME in the last 168 hours. Cardiac Enzymes: No results for input(s): CKTOTAL, CKMB, CKMBINDEX, TROPONINI in the last 168 hours. BNP (last 3 results) Recent Labs    03/10/24 0055 03/13/24 2100 03/23/24 1850  PROBNP >35,000.0* >35,000.0* >35,000.0*   HbA1C: No results for input(s): HGBA1C in the last 72 hours. CBG: No results for input(s): GLUCAP in the last 168 hours. Lipid Profile: No results for input(s): CHOL, HDL, LDLCALC, TRIG, CHOLHDL, LDLDIRECT in the last 72 hours. Thyroid Function Tests: No results for input(s): TSH, T4TOTAL, FREET4, T3FREE, THYROIDAB in the last 72 hours. Anemia Panel: Recent Labs    04/02/24 1427  VITAMINB12 865  FOLATE 6.3  FERRITIN 904*  TIBC 300  IRON  85  RETICCTPCT 2.1   Urine analysis:    Component Value Date/Time   COLORURINE YELLOW 12/30/2023 1305   APPEARANCEUR CLOUDY (A) 12/30/2023 1305   LABSPEC 1.011 12/30/2023 1305   PHURINE 6.0 12/30/2023 1305   GLUCOSEU 50 (A) 12/30/2023 1305   HGBUR SMALL (A) 12/30/2023 1305   BILIRUBINUR NEGATIVE 12/30/2023 1305   KETONESUR NEGATIVE 12/30/2023 1305   PROTEINUR 100 (A) 12/30/2023 1305   UROBILINOGEN 0.2 03/02/2010 2027   NITRITE NEGATIVE  12/30/2023 1305   LEUKOCYTESUR LARGE (A) 12/30/2023 1305   Sepsis Labs: @LABRCNTIP (procalcitonin:4,lacticidven:4)  ) Recent Results (from the past 240 hours)  MRSA Next Gen by PCR, Nasal     Status: None   Collection Time: 03/26/24  9:03 AM   Specimen: Nasal Mucosa; Nasal Swab  Result Value Ref Range Status   MRSA by PCR Next Gen NOT DETECTED NOT DETECTED Final    Comment: (NOTE) The GeneXpert MRSA Assay (FDA approved for NASAL specimens only), is one component of a comprehensive MRSA colonization surveillance program. It is not intended to diagnose MRSA infection nor to guide or monitor treatment for MRSA infections. Test performance is not FDA approved in patients less than 1 years old. Performed at [facility], [address] [place], [place] [address]      Radiology Studies: IR Paracentesis Result Date: 04/02/2024 INDICATION: 64 year old female with history of half path 4/ESRD, with volume overload, and recurrent ascites. IR was requested for therapeutic paracentesis. EXAM: ULTRASOUND GUIDED THERAPEUTIC PARACENTESIS MEDICATIONS: 4  mL of 1% lidocaine . COMPLICATIONS: None immediate. PROCEDURE: Informed written consent was obtained from the patient after a discussion of the risks, benefits and alternatives to treatment. A timeout was performed prior to the initiation of the procedure. Initial ultrasound scanning demonstrates a moderate amount of ascites within the right lower abdominal quadrant. The right lower abdomen was prepped and draped in the usual sterile fashion. 1% lidocaine  was used for local anesthesia. Following this, a 6 Fr Safe-T-Centesis catheter was introduced. An ultrasound image was saved for documentation purposes. The paracentesis was performed. The catheter was removed and a dressing was applied. The patient tolerated the procedure well without immediate post procedural complication. FINDINGS: A total of approximately 1.5 L of clear, blood-tinged peritoneal  fluid was removed. IMPRESSION: Successful ultrasound-guided paracentesis yielding 1.5 liters of peritoneal fluid. Procedure performed Carlin Griffon, PA-C, under the direct supervision of Dr. Cordella Banner, MD. Electronically Signed   By: Cordella Banner   On: 04/02/2024 17:03      Scheduled Meds:  sodium chloride    Intravenous Once   sodium chloride    Intravenous Once   amLODipine   10 mg Oral Daily   atorvastatin   10 mg Oral Daily   budesonide -glycopyrrolate -formoterol   2 puff Inhalation BID   busPIRone   5 mg Oral BID   calcitRIOL   0.25 mcg Oral Daily   carvedilol   6.25 mg Oral BID WC   Chlorhexidine  Gluconate Cloth  6 each Topical Q0600   [START ON 04/07/2024] darbepoetin (ARANESP ) injection - DIALYSIS  150 mcg Subcutaneous Q Wed-1800   folic acid   1 mg Oral Daily   gabapentin   100 mg Oral QHS   heparin  injection (subcutaneous)  5,000 Units Subcutaneous Q8H   multivitamin with minerals  1 tablet Oral Daily   pantoprazole   40 mg Oral BID   thiamine   100 mg Oral Daily   torsemide   100 mg Oral Daily   Continuous Infusions:     LOS: 10 days    Time spent:    Sigurd Pac, MD [facility]   04/03/2024, 11:30 AM    "

## 2024-04-04 ENCOUNTER — Inpatient Hospital Stay (HOSPITAL_COMMUNITY)

## 2024-04-04 LAB — TYPE AND SCREEN
ABO/RH(D): A POS
Antibody Screen: NEGATIVE
Unit division: 0

## 2024-04-04 LAB — CBC
HCT: 22.5 % — ABNORMAL LOW (ref 36.0–46.0)
Hemoglobin: 7.4 g/dL — ABNORMAL LOW (ref 12.0–15.0)
MCH: 31.6 pg (ref 26.0–34.0)
MCHC: 32.9 g/dL (ref 30.0–36.0)
MCV: 96.2 fL (ref 80.0–100.0)
Platelets: 192 K/uL (ref 150–400)
RBC: 2.34 MIL/uL — ABNORMAL LOW (ref 3.87–5.11)
RDW: 18.1 % — ABNORMAL HIGH (ref 11.5–15.5)
WBC: 6.1 K/uL (ref 4.0–10.5)
nRBC: 0 % (ref 0.0–0.2)

## 2024-04-04 LAB — BASIC METABOLIC PANEL WITH GFR
Anion gap: 9 (ref 5–15)
BUN: 25 mg/dL — ABNORMAL HIGH (ref 8–23)
CO2: 28 mmol/L (ref 22–32)
Calcium: 8.2 mg/dL — ABNORMAL LOW (ref 8.9–10.3)
Chloride: 89 mmol/L — ABNORMAL LOW (ref 98–111)
Creatinine, Ser: 2.71 mg/dL — ABNORMAL HIGH (ref 0.44–1.00)
GFR, Estimated: 19 mL/min — ABNORMAL LOW
Glucose, Bld: 85 mg/dL (ref 70–99)
Potassium: 4.7 mmol/L (ref 3.5–5.1)
Sodium: 125 mmol/L — ABNORMAL LOW (ref 135–145)

## 2024-04-04 LAB — BPAM RBC
Blood Product Expiration Date: 202602032359
ISSUE DATE / TIME: 202601171309
Unit Type and Rh: 6200

## 2024-04-04 MED ORDER — LORAZEPAM 0.5 MG PO TABS
0.5000 mg | ORAL_TABLET | Freq: Once | ORAL | Status: AC
  Administered 2024-04-04: 0.5 mg via ORAL
  Filled 2024-04-04: qty 1

## 2024-04-04 MED ORDER — ALUM & MAG HYDROXIDE-SIMETH 200-200-20 MG/5ML PO SUSP
30.0000 mL | ORAL | Status: DC | PRN
  Administered 2024-04-04 – 2024-04-14 (×3): 30 mL via ORAL
  Filled 2024-04-04 (×3): qty 30

## 2024-04-04 MED ORDER — IOHEXOL 350 MG/ML SOLN
75.0000 mL | Freq: Once | INTRAVENOUS | Status: AC | PRN
  Administered 2024-04-04: 75 mL via INTRAVENOUS

## 2024-04-04 MED ORDER — IOHEXOL 9 MG/ML PO SOLN
500.0000 mL | ORAL | Status: AC
  Administered 2024-04-04 (×2): 500 mL via ORAL

## 2024-04-04 MED ORDER — OXYCODONE HCL 5 MG PO TABS
5.0000 mg | ORAL_TABLET | ORAL | Status: DC | PRN
  Administered 2024-04-04 – 2024-04-06 (×8): 5 mg via ORAL
  Filled 2024-04-04 (×7): qty 1

## 2024-04-04 NOTE — Progress Notes (Signed)
 " PROGRESS NOTE    [name]  FMW:[id number] DOB: 1960-12-31 DOA: 03/23/2024 PCP: Pcp, No   63/F w COPD supposed to be on home oxygen , ESRD on HD, noncompliance due to homelessness, polysubstance abuse, diastolic CHF, hypertension, dyslipidemia, anxiety, cirrhosis, ascites back in the ED with chest pain and shortness of breath.  Patient is a poor historian.  She is reporting substernal chest heaviness and shortness of breath for several days.  She was admitted to the hospital last month for volume overload and has not gone for dialysis since she was discharged 2 weeks ago.   - Admitted, started on hemodialysis, volume status improving - 1/13 -14 noted to have worsening agitation and confusion, ABG with severe respiratory acidosis, placed on BiPAP, chest x-ray with worsening pulmonary edema -has been refusing some HD Rx even in the hospital and cutting HD short - 1/16, respiratory status improving, increased abdominal distention, paracentesis-1.5 L of clear blood-tinged peritoneal fluid drained - 1/17, complaining of increased abdominal pain despite paracentesis, CT scan ordered pending  Subjective: - Did not drink contrast for CT yesterday, still working on this  Assessment and Plan:  Acute on chronic hypoxic and hypercarbic respiratory failure ESRD on HD, volume overload, pulmonary edema Metabolic encephalopathy Noncompliance with HD -Worsening volume overload, pulmonary edema and pleural effusions on repeat x-ray again yesterday morning, has been refusing and cutting short some HD rx in ths Hospital as well -  sp paracentesis 1/16 - Volume status finally improving, weight down over 35 LB  Liver cirrhosis History of EtOH abuse, ascites Abdominal pain -Underwent paracentesis 1/9, 675 mL fluid drained, -sp repeat paracentesis 1/16, only 1.5 L drained, complains of increase pain and distention after this, CT still pending, did not drink contrast yesterday, await imaging  today  Transient chest pain Resolved Troponin minimally elevated, no ACS Echo with preserved EF, global hypokinesis  Acute on chronic anemia Hemoglobin down to 7 yesterday, transfuse 1 unit of PRBC, anemia panel consistent with chronic disease - Hemoglobin only 7.4 this morning, will monitor  COPD/chronic respiratory failure Supposed to be on home oxygen  however noncompliant with homelessness - Continue current inhalers, supplemental O2  Hypertension Continue amlodipine  and Coreg   Anxiety/depression Continue BuSpar , dose decreased  Chronic pain/neuropathy Continue gabapentin , decrease dose   DVT prophylaxis: Heparin  subcutaneous Code Status: DNR Family Communication: No immediate family contacts Disposition Plan: Unknown  Consultants:    Procedures:   Antimicrobials:    Objective: Vitals:   04/04/24 0104 04/04/24 0400 04/04/24 0726 04/04/24 0927  BP: 137/71  122/84   Pulse: 76  72 74  Resp: 19  20 19   Temp: 98.6 F (37 C)  98.3 F (36.8 C)   TempSrc: Oral  Oral   SpO2: 94%  95% 99%  Weight:  54.2 kg    Height:        Intake/Output Summary (Last 24 hours) at 04/04/2024 1038 Last data filed at 04/04/2024 0816 Gross per 24 hour  Intake 1440 ml  Output --  Net 1440 ml   Filed Weights   04/02/24 1322 04/03/24 0318 04/04/24 0400  Weight: 54.4 kg 57.3 kg 54.2 kg    Examination:  General exam: Chronically ill appears older than stated age, awake alert oriented to self and place this morning, in better spirits today, sitting up in the recliner HEENT: Positive JVD CVS: S1-S2, regular rhythm Lungs: Decreased breath sounds to bases, few basilar rales Abdomen: Firm, mildly distended, positive fluid thrill Extremities: 1+ edema   Data Reviewed:  CBC: Recent Labs  Lab 03/31/24 0833 04/01/24 0257 04/02/24 0355 04/03/24 0248 04/03/24 2130 04/04/24 0308  WBC 8.8 6.7 7.6 7.1  --  6.1  NEUTROABS 7.6  --   --   --   --   --   HGB 7.4* 7.4* 7.0* 7.0*  7.7* 7.4*  HCT 23.5* 23.5* 22.6* 21.4* 23.7* 22.5*  MCV 100.0 98.3 98.3 97.3  --  96.2  PLT 255 259 257 247  --  192   Basic Metabolic Panel: Recent Labs  Lab 03/29/24 0622 03/31/24 0833 03/31/24 0834 04/01/24 0705 04/02/24 0355 04/04/24 0308  NA 129* 128* 127* 132* 128* 125*  K 4.1 4.4 4.4 4.1 3.5 4.7  CL 90* 90* 90* 94* 90* 89*  CO2 27 27 26 28 26 28   GLUCOSE 95 81 81 86 105* 85  BUN 36* 34* 34* 20 23 25*  CREATININE 3.58* 3.64* 3.62* 2.59* 2.25* 2.71*  CALCIUM  8.1* 8.4* 8.3* 8.5* 8.2* 8.2*  PHOS 5.9*  --  5.3*  --   --   --    GFR: Estimated Creatinine Clearance: 18.2 mL/min (A) (by C-G formula based on SCr of 2.71 mg/dL (H)). Liver Function Tests: Recent Labs  Lab 03/29/24 0622 03/31/24 0833 03/31/24 0834  AST 16 16  --   ALT 16 15  --   ALKPHOS 111 105  --   BILITOT 0.3 0.3  --   PROT 6.4* 6.6  --   ALBUMIN  3.5 3.6 3.5   No results for input(s): LIPASE, AMYLASE in the last 168 hours. No results for input(s): AMMONIA in the last 168 hours. Coagulation Profile: No results for input(s): INR, PROTIME in the last 168 hours. Cardiac Enzymes: No results for input(s): CKTOTAL, CKMB, CKMBINDEX, TROPONINI in the last 168 hours. BNP (last 3 results) Recent Labs    03/10/24 0055 03/13/24 2100 03/23/24 1850  PROBNP >35,000.0* >35,000.0* >35,000.0*   HbA1C: No results for input(s): HGBA1C in the last 72 hours. CBG: No results for input(s): GLUCAP in the last 168 hours. Lipid Profile: No results for input(s): CHOL, HDL, LDLCALC, TRIG, CHOLHDL, LDLDIRECT in the last 72 hours. Thyroid Function Tests: No results for input(s): TSH, T4TOTAL, FREET4, T3FREE, THYROIDAB in the last 72 hours. Anemia Panel: Recent Labs    04/02/24 1427  VITAMINB12 865  FOLATE 6.3  FERRITIN 904*  TIBC 300  IRON  85  RETICCTPCT 2.1   Urine analysis:    Component Value Date/Time   COLORURINE YELLOW 12/30/2023 1305   APPEARANCEUR CLOUDY  (A) 12/30/2023 1305   LABSPEC 1.011 12/30/2023 1305   PHURINE 6.0 12/30/2023 1305   GLUCOSEU 50 (A) 12/30/2023 1305   HGBUR SMALL (A) 12/30/2023 1305   BILIRUBINUR NEGATIVE 12/30/2023 1305   KETONESUR NEGATIVE 12/30/2023 1305   PROTEINUR 100 (A) 12/30/2023 1305   UROBILINOGEN 0.2 03/02/2010 2027   NITRITE NEGATIVE 12/30/2023 1305   LEUKOCYTESUR LARGE (A) 12/30/2023 1305   Sepsis Labs: @LABRCNTIP (procalcitonin:4,lacticidven:4)  ) Recent Results (from the past 240 hours)  MRSA Next Gen by PCR, Nasal     Status: None   Collection Time: 03/26/24  9:03 AM   Specimen: Nasal Mucosa; Nasal Swab  Result Value Ref Range Status   MRSA by PCR Next Gen NOT DETECTED NOT DETECTED Final    Comment: (NOTE) The GeneXpert MRSA Assay (FDA approved for NASAL specimens only), is one component of a comprehensive MRSA colonization surveillance program. It is not intended to diagnose MRSA infection nor to guide or monitor treatment for MRSA  infections. Test performance is not FDA approved in patients less than 17 years old. Performed at [facility], [address] [place], [place] [address]      Radiology Studies: DG HIPS BILAT WITH PELVIS 2V Result Date: 04/03/2024 EXAM: 2 VIEW(S) XRAY OF THE BILATERAL HIP 04/03/2024 07:55:58 PM COMPARISON: None available. CLINICAL HISTORY: Hip pain. FINDINGS: BONES AND JOINTS: No acute fracture. No malalignment. LUMBAR SPINE: Degenerative changes of the visualized lower lumbar spine. SOFT TISSUES: Severe atherosclerotic calcifications. IMPRESSION: 1. No acute findings. Electronically signed by: Franky Stanford MD 04/03/2024 08:23 PM EST RP Workstation: [id number]   CT HEAD WO CONTRAST ( ) Result Date: 04/03/2024 EXAM: CT HEAD WITHOUT CONTRAST 04/03/2024 06:18:54 PM TECHNIQUE: CT of the head was performed without the administration of intravenous contrast. Automated exposure control, iterative reconstruction, and/or weight based adjustment of the mA/kV was  utilized to reduce the radiation dose to as low as reasonably achievable. COMPARISON: None available. CLINICAL HISTORY: Polytrauma, blunt. FINDINGS: BRAIN AND VENTRICLES: No acute hemorrhage. No evidence of acute infarct. No hydrocephalus. No extra-axial collection. No mass effect or midline shift. ORBITS: No acute abnormality. SINUSES: No acute abnormality. SOFT TISSUES AND SKULL: Bilateral mastoid effusions. No acute soft tissue abnormality. No skull fracture. IMPRESSION: 1. No acute intracranial abnormality. 2. Bilateral mastoid effusions. Electronically signed by: Franky Stanford MD 04/03/2024 08:16 PM EST RP Workstation: [id number]   IR Paracentesis Result Date: 04/02/2024 INDICATION: 64 year old female with history of half path 4/ESRD, with volume overload, and recurrent ascites. IR was requested for therapeutic paracentesis. EXAM: ULTRASOUND GUIDED THERAPEUTIC PARACENTESIS MEDICATIONS: 4 mL of 1% lidocaine . COMPLICATIONS: None immediate. PROCEDURE: Informed written consent was obtained from the patient after a discussion of the risks, benefits and alternatives to treatment. A timeout was performed prior to the initiation of the procedure. Initial ultrasound scanning demonstrates a moderate amount of ascites within the right lower abdominal quadrant. The right lower abdomen was prepped and draped in the usual sterile fashion. 1% lidocaine  was used for local anesthesia. Following this, a 6 Fr Safe-T-Centesis catheter was introduced. An ultrasound image was saved for documentation purposes. The paracentesis was performed. The catheter was removed and a dressing was applied. The patient tolerated the procedure well without immediate post procedural complication. FINDINGS: A total of approximately 1.5 L of clear, blood-tinged peritoneal fluid was removed. IMPRESSION: Successful ultrasound-guided paracentesis yielding 1.5 liters of peritoneal fluid. Procedure performed Carlin Griffon, PA-C, under the direct  supervision of Dr. Cordella Banner, MD. Electronically Signed   By: Cordella Banner   On: 04/02/2024 17:03      Scheduled Meds:  sodium chloride    Intravenous Once   amLODipine   10 mg Oral Daily   atorvastatin   10 mg Oral Daily   budesonide -glycopyrrolate -formoterol   2 puff Inhalation BID   busPIRone   5 mg Oral BID   calcitRIOL   0.25 mcg Oral Daily   carvedilol   6.25 mg Oral BID WC   Chlorhexidine  Gluconate Cloth  6 each Topical Q0600   [START ON 04/07/2024] darbepoetin (ARANESP ) injection - DIALYSIS  150 mcg Subcutaneous Q Wed-1800   folic acid   1 mg Oral Daily   gabapentin   100 mg Oral QHS   heparin  injection (subcutaneous)  5,000 Units Subcutaneous Q8H   multivitamin with minerals  1 tablet Oral Daily   pantoprazole   40 mg Oral BID   thiamine   100 mg Oral Daily   torsemide   100 mg Oral Daily   Continuous Infusions:     LOS: 11 days  Time spent:    Sigurd Pac, MD [facility]   04/04/2024, 10:38 AM    "

## 2024-04-04 NOTE — Progress Notes (Signed)
" °   04/04/24 2200  BiPAP/CPAP/SIPAP  Reason BIPAP/CPAP not in use Non-compliant (Pt. refused. Pt. educated on why to wear it.)  BiPAP/CPAP /SiPAP Vitals  Bilateral Breath Sounds Coarse crackles;Rhonchi;Expiratory wheezes  MEWS Score/Color  MEWS Score 0  MEWS Score Color Green    "

## 2024-04-04 NOTE — Progress Notes (Signed)
 "  [facility] Progress Note   Subjective:    Seen in room. Denies any SOB. Wants pain meds for abd pain, advised her that we do not manage pain medications  Objective Vitals:   04/03/24 1936 04/04/24 0104 04/04/24 0400 04/04/24 0726  BP: 139/68 137/71  122/84  Pulse: 72 76  72  Resp: 18 19  20   Temp: 97.8 F (36.6 C) 98.6 F (37 C)  98.3 F (36.8 C)  TempSrc: Oral Oral  Oral  SpO2: 100% 94%  95%  Weight:   54.2 kg   Height:       Physical Exam General: chronically ill appearing Heart: RRR Lungs: normal wob, speaking in full sentences Abdomen: distended Extremities: Trace LE edema Dialysis Access: RU AVF +b   Filed Weights   04/02/24 1322 04/03/24 0318 04/04/24 0400  Weight: 54.4 kg 57.3 kg 54.2 kg    Intake/Output Summary (Last 24 hours) at 04/04/2024 0814 Last data filed at 04/04/2024 0514 Gross per 24 hour  Intake 1080 ml  Output --  Net 1080 ml    Additional Objective Labs: Basic Metabolic Panel: Recent Labs  Lab 03/29/24 0622 03/31/24 0833 03/31/24 0834 04/01/24 0705 04/02/24 0355 04/04/24 0308  NA 129*   < > 127* 132* 128* 125*  K 4.1   < > 4.4 4.1 3.5 4.7  CL 90*   < > 90* 94* 90* 89*  CO2 27   < > 26 28 26 28   GLUCOSE 95   < > 81 86 105* 85  BUN 36*   < > 34* 20 23 25*  CREATININE 3.58*   < > 3.62* 2.59* 2.25* 2.71*  CALCIUM  8.1*   < > 8.3* 8.5* 8.2* 8.2*  PHOS 5.9*  --  5.3*  --   --   --    < > = values in this interval not displayed.   Liver Function Tests: Recent Labs  Lab 03/29/24 0622 03/31/24 0833 03/31/24 0834  AST 16 16  --   ALT 16 15  --   ALKPHOS 111 105  --   BILITOT 0.3 0.3  --   PROT 6.4* 6.6  --   ALBUMIN  3.5 3.6 3.5   No results for input(s): LIPASE, AMYLASE in the last 168 hours. CBC: Recent Labs  Lab 03/31/24 0833 04/01/24 0257 04/02/24 0355 04/03/24 0248 04/03/24 2130 04/04/24 0308  WBC 8.8 6.7 7.6 7.1  --  6.1  NEUTROABS 7.6  --   --   --   --   --   HGB 7.4* 7.4* 7.0* 7.0* 7.7* 7.4*   HCT 23.5* 23.5* 22.6* 21.4* 23.7* 22.5*  MCV 100.0 98.3 98.3 97.3  --  96.2  PLT 255 259 257 247  --  192   Blood Culture    Component Value Date/Time   SDES PERITONEAL 01/02/2024 1152   SDES PERITONEAL 01/02/2024 1152   SPECREQUEST NONE 01/02/2024 1152   SPECREQUEST NONE 01/02/2024 1152   CULT  01/02/2024 1152    NO GROWTH 5 DAYS Performed at [facility], [address], [place], [place] [address]    REPTSTATUS 01/07/2024 FINAL 01/02/2024 1152   REPTSTATUS 01/02/2024 FINAL 01/02/2024 1152    Cardiac Enzymes: No results for input(s): CKTOTAL, CKMB, CKMBINDEX, TROPONINI in the last 168 hours. CBG: No results for input(s): GLUCAP in the last 168 hours. Iron  Studies:  Recent Labs    04/02/24 1427  IRON  85  TIBC 300  FERRITIN 904*  Lab Results  Component Value Date   INR 1.1 03/10/2024   INR 1.1 12/30/2023   INR 1.1 05/16/2023   Studies/Results: DG HIPS BILAT WITH PELVIS 2V Result Date: 04/03/2024 EXAM: 2 VIEW(S) XRAY OF THE BILATERAL HIP 04/03/2024 07:55:58 PM COMPARISON: None available. CLINICAL HISTORY: Hip pain. FINDINGS: BONES AND JOINTS: No acute fracture. No malalignment. LUMBAR SPINE: Degenerative changes of the visualized lower lumbar spine. SOFT TISSUES: Severe atherosclerotic calcifications. IMPRESSION: 1. No acute findings. Electronically signed by: Franky Stanford MD 04/03/2024 08:23 PM EST RP Workstation: [id number]   CT HEAD WO CONTRAST ( ) Result Date: 04/03/2024 EXAM: CT HEAD WITHOUT CONTRAST 04/03/2024 06:18:54 PM TECHNIQUE: CT of the head was performed without the administration of intravenous contrast. Automated exposure control, iterative reconstruction, and/or weight based adjustment of the mA/kV was utilized to reduce the radiation dose to as low as reasonably achievable. COMPARISON: None available. CLINICAL HISTORY: Polytrauma, blunt. FINDINGS: BRAIN AND VENTRICLES: No acute hemorrhage. No evidence of acute infarct. No hydrocephalus.  No extra-axial collection. No mass effect or midline shift. ORBITS: No acute abnormality. SINUSES: No acute abnormality. SOFT TISSUES AND SKULL: Bilateral mastoid effusions. No acute soft tissue abnormality. No skull fracture. IMPRESSION: 1. No acute intracranial abnormality. 2. Bilateral mastoid effusions. Electronically signed by: Franky Stanford MD 04/03/2024 08:16 PM EST RP Workstation: [id number]   IR Paracentesis Result Date: 04/02/2024 INDICATION: 64 year old female with history of half path 4/ESRD, with volume overload, and recurrent ascites. IR was requested for therapeutic paracentesis. EXAM: ULTRASOUND GUIDED THERAPEUTIC PARACENTESIS MEDICATIONS: 4 mL of 1% lidocaine . COMPLICATIONS: None immediate. PROCEDURE: Informed written consent was obtained from the patient after a discussion of the risks, benefits and alternatives to treatment. A timeout was performed prior to the initiation of the procedure. Initial ultrasound scanning demonstrates a moderate amount of ascites within the right lower abdominal quadrant. The right lower abdomen was prepped and draped in the usual sterile fashion. 1% lidocaine  was used for local anesthesia. Following this, a 6 Fr Safe-T-Centesis catheter was introduced. An ultrasound image was saved for documentation purposes. The paracentesis was performed. The catheter was removed and a dressing was applied. The patient tolerated the procedure well without immediate post procedural complication. FINDINGS: A total of approximately 1.5 L of clear, blood-tinged peritoneal fluid was removed. IMPRESSION: Successful ultrasound-guided paracentesis yielding 1.5 liters of peritoneal fluid. Procedure performed Carlin Griffon, PA-C, under the direct supervision of Dr. Cordella Banner, MD. Electronically Signed   By: Cordella Banner   On: 04/02/2024 17:03     Medications:    sodium chloride    Intravenous Once   amLODipine   10 mg Oral Daily   atorvastatin   10 mg Oral Daily    budesonide -glycopyrrolate -formoterol   2 puff Inhalation BID   busPIRone   5 mg Oral BID   calcitRIOL   0.25 mcg Oral Daily   carvedilol   6.25 mg Oral BID WC   Chlorhexidine  Gluconate Cloth  6 each Topical Q0600   [START ON 04/07/2024] darbepoetin (ARANESP ) injection - DIALYSIS  150 mcg Subcutaneous Q Wed-1800   folic acid   1 mg Oral Daily   gabapentin   100 mg Oral QHS   heparin  injection (subcutaneous)  5,000 Units Subcutaneous Q8H   multivitamin with minerals  1 tablet Oral Daily   pantoprazole   40 mg Oral BID   thiamine   100 mg Oral Daily   torsemide   100 mg Oral Daily    Dialysis Orders: from 2024 -->  3h  B350 Heparin  none  RUA AVF No dialysis center currently  Assessment/Plan: Volume overload, AHRF - CXR on 1/6 with vascular congestion, pleural effusion and atelectasis.  +hypoxia on 3L O2. +Ascites. Pulm edema on cxr 1/14, UF as tolerated-improving   Liver cirrhosis, Ascites  - s/p paracentesis with UF removed on 1/9.  Status post paracentesis 1/16 with 1.5 L removed  Chronic hyponatremia - will continue to attempt to manage with HD/UF  ESRD - Does not have regular HD center, has historically been very noncompliant with HD hence no OP HD unit ([facility])  -HD on MWF schedule, next HD on Monday.  No indications for dialysis today             -renal navigator and CM following for dispo  Anemia of CKD- Hgb 7.0, Venofer  x 2 ordered, ESA started 1/14--dose increased. Iron  replete  Secondary hyperparathyroidism - Ca a little low. Calcitriol  started.  Using increased Ca bath.  Phos acceptable, start binders if indicated.   HTN/volume - Blood pressure slowly coming down with HD, cont to lower volume w/ HD as tolerated  Nutrition - On regular diet, monitor labs.  fluid restriction .  COPD - continue home meds.   Ephriam Stank, MD [facility] 04/04/2024,8:14 AM  LOS: 11 days    "

## 2024-04-04 NOTE — Plan of Care (Signed)
   Problem: Activity: Goal: Risk for activity intolerance will decrease Outcome: Progressing   Problem: Coping: Goal: Level of anxiety will decrease Outcome: Progressing

## 2024-04-04 NOTE — Plan of Care (Signed)

## 2024-04-05 LAB — CBC
HCT: 23.5 % — ABNORMAL LOW (ref 36.0–46.0)
Hemoglobin: 7.5 g/dL — ABNORMAL LOW (ref 12.0–15.0)
MCH: 31.1 pg (ref 26.0–34.0)
MCHC: 31.9 g/dL (ref 30.0–36.0)
MCV: 97.5 fL (ref 80.0–100.0)
Platelets: 196 K/uL (ref 150–400)
RBC: 2.41 MIL/uL — ABNORMAL LOW (ref 3.87–5.11)
RDW: 17.5 % — ABNORMAL HIGH (ref 11.5–15.5)
WBC: 6.6 K/uL (ref 4.0–10.5)
nRBC: 0 % (ref 0.0–0.2)

## 2024-04-05 LAB — BASIC METABOLIC PANEL WITH GFR
Anion gap: 12 (ref 5–15)
BUN: 36 mg/dL — ABNORMAL HIGH (ref 8–23)
CO2: 23 mmol/L (ref 22–32)
Calcium: 8.4 mg/dL — ABNORMAL LOW (ref 8.9–10.3)
Chloride: 80 mmol/L — ABNORMAL LOW (ref 98–111)
Creatinine, Ser: 3.33 mg/dL — ABNORMAL HIGH (ref 0.44–1.00)
GFR, Estimated: 15 mL/min — ABNORMAL LOW
Glucose, Bld: 100 mg/dL — ABNORMAL HIGH (ref 70–99)
Potassium: 5.6 mmol/L — ABNORMAL HIGH (ref 3.5–5.1)
Sodium: 115 mmol/L — CL (ref 135–145)

## 2024-04-05 MED ORDER — PROCHLORPERAZINE EDISYLATE 10 MG/2ML IJ SOLN
5.0000 mg | Freq: Once | INTRAMUSCULAR | Status: AC
  Administered 2024-04-05: 5 mg via INTRAVENOUS
  Filled 2024-04-05: qty 2

## 2024-04-05 MED ORDER — ACETAMINOPHEN 325 MG PO TABS
ORAL_TABLET | ORAL | Status: AC
  Filled 2024-04-05: qty 2

## 2024-04-05 MED ORDER — MELATONIN 3 MG PO TABS
3.0000 mg | ORAL_TABLET | Freq: Once | ORAL | Status: AC
  Administered 2024-04-05: 3 mg via ORAL
  Filled 2024-04-05: qty 1

## 2024-04-05 NOTE — Progress Notes (Signed)
 " PROGRESS NOTE    [name]  FMW:[id number] DOB: 11-18-1960 DOA: 03/23/2024 PCP: Pcp, No   63/F w COPD supposed to be on home oxygen , ESRD on HD, noncompliance due to homelessness, polysubstance abuse, diastolic CHF, hypertension, dyslipidemia, anxiety, cirrhosis, ascites back in the ED with chest pain and shortness of breath.  Patient is a poor historian.  She is reporting substernal chest heaviness and shortness of breath for several days.  She was admitted to the hospital last month for volume overload and has not gone for dialysis since she was discharged 2 weeks ago.   - Admitted, started on hemodialysis, volume status improving - 1/13 -14 noted to have worsening agitation and confusion, ABG with severe respiratory acidosis, placed on BiPAP, chest x-ray with worsening pulmonary edema -has been refusing some HD Rx even in the hospital and cutting HD short - 1/16, respiratory status improving, increased abdominal distention, paracentesis-1.5 L of clear blood-tinged peritoneal fluid drained -1/17 sustained a fall - 1/17, complaining of increased abdominal pain despite paracentesis, CT scan ordered pending - CT 1/18: Right lateral abdominal wall hematoma  Subjective: - No shortness of breath, continues to have right sided abdominal wall pain  Assessment and Plan:  Acute on chronic hypoxic and hypercarbic respiratory failure ESRD on HD, volume overload, pulmonary edema Metabolic encephalopathy Noncompliance with HD -Worsening volume overload, pulmonary edema and pleural effusions on repeat x-ray again yesterday morning, has been refusing and cutting short some HD rx in ths Hospital as well -  sp paracentesis 1/16 - Volume status finally improving, weight down over 35 LB - For HD today  Liver cirrhosis History of EtOH abuse, ascites Abdominal pain -Underwent paracentesis 1/9, 675 mL fluid drained, -sp repeat paracentesis 1/16, only 1.5 L drained, complains of increase pain and  distention after this, CT finally completed yesterday, small ascites, cirrhosis, right lateral abdominal wall hematoma, will hold heparin   Right lateral abdominal wall hematoma - Sustained after fall this weekend - Hold heparin , supportive care  Acute on chronic diastolic CHF - Volume managed with HD and paracentesis - GDMT limited by ESRD  Transient chest pain Resolved Troponin minimally elevated, no ACS Echo with preserved EF, global hypokinesis  Acute on chronic anemia Hemoglobin down to 7 yesterday, transfuse 1 unit of PRBC, anemia panel consistent with chronic disease - Hemoglobin around 7.4, monitor  Severe COPD/chronic respiratory failure Supposed to be on home oxygen  however noncompliant with homelessness - Continue current inhalers, supplemental O2  Hypertension Continue amlodipine  and Coreg   Anxiety/depression Continue BuSpar , dose decreased  Chronic pain/neuropathy Continue gabapentin , decrease dose  Goals, chronically ill debilitated homeless female with advanced COPD/chronic respiratory failure supposed to be on home oxygen  but does not use this due to homelessness, ESRD, diastolic CHF, liver cirrhosis and ascites, frequent hospitalizations, ongoing failure to thrive  - High risk of decompensation, discussed disease burden with the patient today, palliative consult   DVT prophylaxis: Heparin  subcutaneous Code Status: DNR Family Communication: No immediate family contacts, will attempt to reach daughter today Disposition Plan: Unknown  Consultants:    Procedures:   Antimicrobials:    Objective: Vitals:   04/05/24 0304 04/05/24 0453 04/05/24 0754 04/05/24 0844  BP: (!) 148/66  (!) 140/67   Pulse: 66  70   Resp: 20  18   Temp: 97.9 F (36.6 C)  97.8 F (36.6 C)   TempSrc: Oral  Oral   SpO2: 95%  97% 92%  Weight:  62.7 kg    Height:  Intake/Output Summary (Last 24 hours) at 04/05/2024 1007 Last data filed at 04/05/2024 0854 Gross per 24  hour  Intake 720 ml  Output --  Net 720 ml   Filed Weights   04/03/24 0318 04/04/24 0400 04/05/24 0453  Weight: 57.3 kg 54.2 kg 62.7 kg    Examination:  General exam: Chronically ill appears older than stated age, awake alert oriented to self and place this morning, in better spirits today, sitting up in the recliner HEENT: Positive JVD CVS: S1-S2, regular rhythm Lungs: Decreased breath sounds to bases, few basilar rales Abdomen: Firm, mildly distended, positive fluid thrill Extremities: 1+ edema   Data Reviewed:   CBC: Recent Labs  Lab 03/31/24 0833 04/01/24 0257 04/02/24 0355 04/03/24 0248 04/03/24 2130 04/04/24 0308 04/05/24 0253  WBC 8.8 6.7 7.6 7.1  --  6.1 6.6  NEUTROABS 7.6  --   --   --   --   --   --   HGB 7.4* 7.4* 7.0* 7.0* 7.7* 7.4* 7.5*  HCT 23.5* 23.5* 22.6* 21.4* 23.7* 22.5* 23.5*  MCV 100.0 98.3 98.3 97.3  --  96.2 97.5  PLT 255 259 257 247  --  192 196   Basic Metabolic Panel: Recent Labs  Lab 03/31/24 0834 04/01/24 0705 04/02/24 0355 04/04/24 0308 04/05/24 0253  NA 127* 132* 128* 125* 115*  K 4.4 4.1 3.5 4.7 5.6*  CL 90* 94* 90* 89* 80*  CO2 26 28 26 28 23   GLUCOSE 81 86 105* 85 100*  BUN 34* 20 23 25* 36*  CREATININE 3.62* 2.59* 2.25* 2.71* 3.33*  CALCIUM  8.3* 8.5* 8.2* 8.2* 8.4*  PHOS 5.3*  --   --   --   --    GFR: Estimated Creatinine Clearance: 16.8 mL/min (A) (by C-G formula based on SCr of 3.33 mg/dL (H)). Liver Function Tests: Recent Labs  Lab 03/31/24 0833 03/31/24 0834  AST 16  --   ALT 15  --   ALKPHOS 105  --   BILITOT 0.3  --   PROT 6.6  --   ALBUMIN  3.6 3.5   No results for input(s): LIPASE, AMYLASE in the last 168 hours. No results for input(s): AMMONIA in the last 168 hours. Coagulation Profile: No results for input(s): INR, PROTIME in the last 168 hours. Cardiac Enzymes: No results for input(s): CKTOTAL, CKMB, CKMBINDEX, TROPONINI in the last 168 hours. BNP (last 3 results) Recent Labs     03/10/24 0055 03/13/24 2100 03/23/24 1850  PROBNP >35,000.0* >35,000.0* >35,000.0*   HbA1C: No results for input(s): HGBA1C in the last 72 hours. CBG: No results for input(s): GLUCAP in the last 168 hours. Lipid Profile: No results for input(s): CHOL, HDL, LDLCALC, TRIG, CHOLHDL, LDLDIRECT in the last 72 hours. Thyroid Function Tests: No results for input(s): TSH, T4TOTAL, FREET4, T3FREE, THYROIDAB in the last 72 hours. Anemia Panel: Recent Labs    04/02/24 1427  VITAMINB12 865  FOLATE 6.3  FERRITIN 904*  TIBC 300  IRON  85  RETICCTPCT 2.1   Urine analysis:    Component Value Date/Time   COLORURINE YELLOW 12/30/2023 1305   APPEARANCEUR CLOUDY (A) 12/30/2023 1305   LABSPEC 1.011 12/30/2023 1305   PHURINE 6.0 12/30/2023 1305   GLUCOSEU 50 (A) 12/30/2023 1305   HGBUR SMALL (A) 12/30/2023 1305   BILIRUBINUR NEGATIVE 12/30/2023 1305   KETONESUR NEGATIVE 12/30/2023 1305   PROTEINUR 100 (A) 12/30/2023 1305   UROBILINOGEN 0.2 03/02/2010 2027   NITRITE NEGATIVE 12/30/2023 1305   LEUKOCYTESUR LARGE (  A) 12/30/2023 1305   Sepsis Labs: @LABRCNTIP (procalcitonin:4,lacticidven:4)  ) No results found for this or any previous visit (from the past 240 hours).    Radiology Studies: CT ABDOMEN PELVIS W CONTRAST Result Date: 04/04/2024 CLINICAL DATA:  Abdominal pain with EXAM: CT ABDOMEN AND PELVIS WITH CONTRAST TECHNIQUE: Multidetector CT imaging of the abdomen and pelvis was performed using the standard protocol following bolus administration of intravenous contrast. RADIATION DOSE REDUCTION: This exam was performed according to the departmental dose-optimization program which includes automated exposure control, adjustment of the mA and/or kV according to patient size and/or use of iterative reconstruction technique. CONTRAST:  75mL OMNIPAQUE  IOHEXOL  350 MG/ML SOLN COMPARISON:  CT abdomen pelvis dated 01/06/2024. FINDINGS: Lower chest: Partially visualized  moderate left pleural effusion with partial compressive atelectasis of the left lower lobe relatively similar or slightly progressed since the prior CT. Pneumonia is not excluded. Additional right lung base subsegmental consolidation may represent atelectasis or infiltrate. No intra-abdominal free air.  Small ascites. Hepatobiliary: There is slight irregularity of the liver contour in keeping with cirrhosis. No acutely ductal dilatation. The gallbladder is unremarkable. Pancreas: Unremarkable. No pancreatic ductal dilatation or surrounding inflammatory changes. Spleen: Normal in size without focal abnormality. Adrenals/Urinary Tract: The adrenal glands unremarkable. Moderate bilateral renal parenchyma atrophy. The right kidney is located in the inferior abdomen. There is no hydronephrosis on either side. The urinary bladder is minimally distended and grossly unremarkable. Stomach/Bowel: There is no bowel obstruction. The appendix is normal. Vascular/Lymphatic: Advanced aortoiliac atherosclerotic disease. The IVC is unremarkable. No portal venous gas. There is no adenopathy. Reproductive: The uterus is grossly under. No suspicious adnexal masses. Other: Diffuse abdominal wall edema and anasarca. Indeterminate ill-defined ovoid high attenuating area in the right lateral abdominal wall musculature (51/13 and coronal 57/6) measuring approximately 6.5 x 3.0 cm. This is not characterized but may represent an intramuscular hematoma. Attention on follow-up imaging recommended. Musculoskeletal: Osteopenia with degenerative changes. No acute osseous pathology. Right femoral head avascular necrosis. No cortical collapse. IMPRESSION: 1. No bowel obstruction. Normal appendix. 2. Cirrhosis with small ascites and anasarca, worsened since the prior CT. 3. Partially visualized moderate left pleural effusion with partial compressive atelectasis of the left lower lobe. Pneumonia is not excluded. 4. Indeterminate ill-defined ovoid high  attenuating area in the right lateral abdominal wall musculature may represent an intramuscular hematoma. Attention on follow-up imaging recommended. 5.  Aortic Atherosclerosis (ICD10-I70.0). Electronically Signed   By: Vanetta Chou M.D.   On: 04/04/2024 17:30   DG HIPS BILAT WITH PELVIS 2V Result Date: 04/03/2024 EXAM: 2 VIEW(S) XRAY OF THE BILATERAL HIP 04/03/2024 07:55:58 PM COMPARISON: None available. CLINICAL HISTORY: Hip pain. FINDINGS: BONES AND JOINTS: No acute fracture. No malalignment. LUMBAR SPINE: Degenerative changes of the visualized lower lumbar spine. SOFT TISSUES: Severe atherosclerotic calcifications. IMPRESSION: 1. No acute findings. Electronically signed by: Franky Stanford MD 04/03/2024 08:23 PM EST RP Workstation: [id number]   CT HEAD WO CONTRAST ( ) Result Date: 04/03/2024 EXAM: CT HEAD WITHOUT CONTRAST 04/03/2024 06:18:54 PM TECHNIQUE: CT of the head was performed without the administration of intravenous contrast. Automated exposure control, iterative reconstruction, and/or weight based adjustment of the mA/kV was utilized to reduce the radiation dose to as low as reasonably achievable. COMPARISON: None available. CLINICAL HISTORY: Polytrauma, blunt. FINDINGS: BRAIN AND VENTRICLES: No acute hemorrhage. No evidence of acute infarct. No hydrocephalus. No extra-axial collection. No mass effect or midline shift. ORBITS: No acute abnormality. SINUSES: No acute abnormality. SOFT TISSUES AND SKULL: Bilateral mastoid effusions. No acute  soft tissue abnormality. No skull fracture. IMPRESSION: 1. No acute intracranial abnormality. 2. Bilateral mastoid effusions. Electronically signed by: Franky Stanford MD 04/03/2024 08:16 PM EST RP Workstation: [id number]      Scheduled Meds:  sodium chloride    Intravenous Once   amLODipine   10 mg Oral Daily   atorvastatin   10 mg Oral Daily   budesonide -glycopyrrolate -formoterol   2 puff Inhalation BID   busPIRone   5 mg Oral BID   calcitRIOL   0.25 mcg  Oral Daily   carvedilol   6.25 mg Oral BID WC   Chlorhexidine  Gluconate Cloth  6 each Topical Q0600   [START ON 04/07/2024] darbepoetin (ARANESP ) injection - DIALYSIS  150 mcg Subcutaneous Q Wed-1800   folic acid   1 mg Oral Daily   gabapentin   100 mg Oral QHS   multivitamin with minerals  1 tablet Oral Daily   pantoprazole   40 mg Oral BID   thiamine   100 mg Oral Daily   torsemide   100 mg Oral Daily   Continuous Infusions:     LOS: 12 days    Time spent:    Sigurd Pac, MD [facility]   04/05/2024, 10:07 AM    "

## 2024-04-05 NOTE — Plan of Care (Signed)
 Patient ID: [name], female   DOB: Nov 14, 1960, 64 y.o.   MRN: [id number]  Problem: Safety: Goal: Non-violent Restraint(s) Outcome: Completed/Met  Verdie JONETTA Collier, RN

## 2024-04-05 NOTE — Progress Notes (Signed)
 " [facility] Progress Note   Subjective:    Seen in HD unit No c/o's today  Objective Vitals:   04/05/24 1234 04/05/24 1304 04/05/24 1330 04/05/24 1401  BP: 135/63 131/67 135/66 139/65  Pulse: 60 (!) 58 (!) 58 62  Resp: 17 16 13 12   Temp:      TempSrc:      SpO2: 99% 100% 99% 97%  Weight:      Height:       Physical Exam General: chronically ill appearing Heart: RRR Lungs: coarse breath sounds, normal wob Abdomen: distended Extremities: 1+ bilat pretib edema, mild hip edema Dialysis Access: RU AVF +b    Dialysis Orders: from 2024 -->  3h  B350 Heparin  none  RUA AVF No dialysis center currently    Assessment/Plan  Volume overload, AHRF - CXR on 1/6 with vascular congestion, pleural effusion and atelectasis.  +hypoxia on 3L O2. +Ascites. Pulm edema on cxr 1/14, UF as tolerated - came in 70-73kg, is now down to 57- 63kg - sig better   Agitation/ AMS - required CIWA protocol last week - stable now   Liver cirrhosis, Ascites  - s/p paracentesis with UF removed on 1/9.  Status post paracentesis 1/16 with 1.5 L removed  Chronic hyponatremia - will continue to attempt to manage with HD/UF - Na+ 115 this am, hopefully HD helped to correct this  - get f/u labs in am - not symptomatic today, alert and interactive  - changed back to renal diet w/ 1200 cc FR  ESRD - Does not have regular HD center, has historically been very noncompliant with HD hence no OP HD unit ([facility])  -HD on MWF schedule, next HD on Monday             -renal navigator and CM following for dispo  Anemia of CKD- Hgb 7.0, Venofer  x 2 ordered, ESA started 1/14--dose increased. Iron  replete - Hb 7.5 today  Secondary hyperparathyroidism - Ca a little low. Calcitriol  started.  Using increased Ca bath.  Phos acceptable, start binders if indicated.   HTN/volume - Blood pressure slowly coming down with HD, cont to lower volume w/ HD as  tolerated  Nutrition - On regular diet, monitor labs.  fluid restriction .  COPD - continue home meds  GOC  - pt is DNR-Limited - very poor prognosis overall   Myer Fret  MD  [facility] 04/05/2024, 2:33 PM  Recent Labs  Lab 03/31/24 0833 03/31/24 0834 04/01/24 0257 04/04/24 0308 04/05/24 0253  HGB 7.4*  --    < > 7.4* 7.5*  ALBUMIN  3.6 3.5  --   --   --   CALCIUM  8.4* 8.3*   < > 8.2* 8.4*  PHOS  --  5.3*  --   --   --   CREATININE 3.64* 3.62*   < > 2.71* 3.33*  K 4.4 4.4   < > 4.7 5.6*   < > = values in this interval not displayed.    Inpatient medications:  sodium chloride    Intravenous Once   amLODipine   10 mg Oral Daily   atorvastatin   10 mg Oral Daily   budesonide -glycopyrrolate -formoterol   2 puff Inhalation BID   busPIRone   5 mg Oral BID   calcitRIOL   0.25 mcg Oral Daily   carvedilol   6.25 mg Oral BID WC   Chlorhexidine  Gluconate Cloth  6 each Topical Q0600   [START ON 04/07/2024] darbepoetin (ARANESP ) injection -  DIALYSIS  150 mcg Subcutaneous Q Wed-1800   folic acid   1 mg Oral Daily   gabapentin   100 mg Oral QHS   multivitamin with minerals  1 tablet Oral Daily   pantoprazole   40 mg Oral BID   thiamine   100 mg Oral Daily   torsemide   100 mg Oral Daily    acetaminophen  **OR** acetaminophen , alum & mag hydroxide-simeth, ipratropium-albuterol , mouth rinse, oxyCODONE          "

## 2024-04-06 LAB — CBC
HCT: 22.8 % — ABNORMAL LOW (ref 36.0–46.0)
Hemoglobin: 7.3 g/dL — ABNORMAL LOW (ref 12.0–15.0)
MCH: 31.6 pg (ref 26.0–34.0)
MCHC: 32 g/dL (ref 30.0–36.0)
MCV: 98.7 fL (ref 80.0–100.0)
Platelets: 193 K/uL (ref 150–400)
RBC: 2.31 MIL/uL — ABNORMAL LOW (ref 3.87–5.11)
RDW: 17.4 % — ABNORMAL HIGH (ref 11.5–15.5)
WBC: 5.6 K/uL (ref 4.0–10.5)
nRBC: 0 % (ref 0.0–0.2)

## 2024-04-06 LAB — BASIC METABOLIC PANEL WITH GFR
Anion gap: 8 (ref 5–15)
BUN: 23 mg/dL (ref 8–23)
CO2: 28 mmol/L (ref 22–32)
Calcium: 8.4 mg/dL — ABNORMAL LOW (ref 8.9–10.3)
Chloride: 88 mmol/L — ABNORMAL LOW (ref 98–111)
Creatinine, Ser: 2.24 mg/dL — ABNORMAL HIGH (ref 0.44–1.00)
GFR, Estimated: 24 mL/min — ABNORMAL LOW
Glucose, Bld: 86 mg/dL (ref 70–99)
Potassium: 4.8 mmol/L (ref 3.5–5.1)
Sodium: 125 mmol/L — ABNORMAL LOW (ref 135–145)

## 2024-04-06 MED ORDER — OXYCODONE HCL 5 MG PO TABS
5.0000 mg | ORAL_TABLET | ORAL | Status: DC | PRN
  Administered 2024-04-06 – 2024-04-10 (×12): 10 mg via ORAL
  Administered 2024-04-11 (×2): 5 mg via ORAL
  Administered 2024-04-11 – 2024-04-12 (×3): 10 mg via ORAL
  Administered 2024-04-12: 5 mg via ORAL
  Administered 2024-04-13: 10 mg via ORAL
  Administered 2024-04-13: 5 mg via ORAL
  Administered 2024-04-13 – 2024-04-15 (×3): 10 mg via ORAL
  Filled 2024-04-06 (×5): qty 2
  Filled 2024-04-06: qty 1
  Filled 2024-04-06 (×2): qty 2
  Filled 2024-04-06: qty 1
  Filled 2024-04-06 (×3): qty 2
  Filled 2024-04-06: qty 1
  Filled 2024-04-06: qty 2
  Filled 2024-04-06: qty 1
  Filled 2024-04-06 (×4): qty 2
  Filled 2024-04-06: qty 1
  Filled 2024-04-06 (×2): qty 2

## 2024-04-06 MED ORDER — CHLORHEXIDINE GLUCONATE CLOTH 2 % EX PADS
6.0000 | MEDICATED_PAD | Freq: Every day | CUTANEOUS | Status: DC
  Administered 2024-04-07 – 2024-04-14 (×5): 6 via TOPICAL

## 2024-04-06 NOTE — Progress Notes (Signed)
 This chaplain responded to the unit's consult for Pt. spiritual care. The chaplain reviewed the Pt. chart notes and received an update from PMT PA-Josseline before the visit.  The Pt. accepted the chaplain's visit as she ate a cupcake and orange sherbet. The chaplain listened reflectively as the Pt. shared the emotions she is experiencing during this hospital admission. The chaplain understands the Pt. emotions are reflected in her description of quality of life, uncertainty of medical insurance, and feelings of separation from her family.    The Pt. invited the chaplain to return as she answered the hospital phone.  Chaplain Leeroy Hummer [phone]

## 2024-04-06 NOTE — Progress Notes (Signed)
 " [name] Progress Note   Subjective:    Seen in HD unit No c/o's today  Objective Vitals:   04/06/24 0500 04/06/24 0811 04/06/24 0848 04/06/24 1108  BP:  139/88  123/73  Pulse:  76 76 62  Resp:  20 20 20   Temp:  98 F (36.7 C)  98.1 F (36.7 C)  TempSrc:  Oral  Oral  SpO2:  99% 99% 94%  Weight: 61.6 kg     Height:       Physical Exam General: chronically ill appearing, 4 L [place] O2 Heart: RRR Lungs: coarse breath sounds, normal wob Abdomen: distended 1-2+  Extremities: 1+ bilat LE edema Dialysis Access: RU AVF +b    Dialysis Orders: from 2024 -->  3h  B350 Heparin  none  RUA AVF No dialysis center currently    Assessment/Plan  Volume overload, AHRF - CXR 1/14 +pulm edema  - came in 70-73kg, is now down to 57- 63kg - on 4 L [place] O2 - still has excess vol, difficult to keep it down  Agitation/ AMS - required CIWA protocol last week - stable now   Liver cirrhosis, Ascites - s/p paracentesis 0.67 L on 1/9 - s/p paracentesis 1.5 L on 1/16  Chronic hyponatremia - will continue to attempt to manage with HD/UF - Na+ 115 yest, up to 125 today after HD - changed back to renal diet w/ 1200 cc FR  ESRD - does not have regular HD center - has been very noncompliant with outpt HD -> no HD unit - as inpatient we are on MWF schedule - next HD Wed   Chronic homelessness  Anemia of CKD - Hgb 7.0, Venofer  x 2 ordered, ESA started 1/14 and dose increased - Iron  replete - Hb 7-8 range here   HTN/volume - bp down with HD - cont to lower volume w/ HD as tolerated  Nutrition - changed back to renal diet  - fluid restriction .  COPD - continue home meds  GOC  - pt is DNR-Limited - very poor prognosis, has no where to stay in OP setting - meeting w/ palliative care today      Myer Fret  MD  [facility] 04/06/2024, 11:37 AM  Recent Labs  Lab 03/31/24 0833 03/31/24 0834 04/01/24 0257 04/05/24 0253 04/06/24 0247  HGB 7.4*  --    <  > 7.5* 7.3*  ALBUMIN  3.6 3.5  --   --   --   CALCIUM  8.4* 8.3*   < > 8.4* 8.4*  PHOS  --  5.3*  --   --   --   CREATININE 3.64* 3.62*   < > 3.33* 2.24*  K 4.4 4.4   < > 5.6* 4.8   < > = values in this interval not displayed.    Inpatient medications:  sodium chloride    Intravenous Once   amLODipine   10 mg Oral Daily   atorvastatin   10 mg Oral Daily   budesonide -glycopyrrolate -formoterol   2 puff Inhalation BID   busPIRone   5 mg Oral BID   calcitRIOL   0.25 mcg Oral Daily   carvedilol   6.25 mg Oral BID WC   Chlorhexidine  Gluconate Cloth  6 each Topical Q0600   [START ON 04/07/2024] darbepoetin (ARANESP ) injection - DIALYSIS  150 mcg Subcutaneous Q Wed-1800   folic acid   1 mg Oral Daily   gabapentin   100 mg Oral QHS   multivitamin with minerals  1 tablet Oral Daily   pantoprazole   40 mg  Oral BID   thiamine   100 mg Oral Daily   torsemide   100 mg Oral Daily    acetaminophen  **OR** acetaminophen , alum & mag hydroxide-simeth, ipratropium-albuterol , mouth rinse, oxyCODONE          "

## 2024-04-06 NOTE — Plan of Care (Signed)

## 2024-04-06 NOTE — Progress Notes (Signed)
 patient companied she felt non itchy bumps inside her vaginal opening, she said she is very concerned, I see non itchy clusters of bumps/skin tag like. No discharge, it doesn't itch or burn per patient. when asked she said she hasn't been sexually active in [place] time. Dr. Fairy notified. Plan of care continues.

## 2024-04-06 NOTE — Progress Notes (Signed)
 " PROGRESS NOTE    [name]  FMW:[id number] DOB: 10/17/1960 DOA: 03/23/2024 PCP: Pcp, No   63/F w COPD supposed to be on home oxygen , ESRD on HD, noncompliance due to homelessness, polysubstance abuse, diastolic CHF, hypertension, dyslipidemia, anxiety, cirrhosis, ascites back in the ED with chest pain and shortness of breath.  Patient is a poor historian.  She is reporting substernal chest heaviness and shortness of breath for several days.  She was admitted to the hospital last month for volume overload and has not gone for dialysis since she was discharged 2 weeks ago.   - Admitted, started on hemodialysis, volume status improving - 1/13 -14 noted to have worsening agitation and confusion, ABG with severe respiratory acidosis, placed on BiPAP, chest x-ray with worsening pulmonary edema -has been refusing some HD Rx even in the hospital and cutting HD short - 1/16, respiratory status improving, increased abdominal distention, paracentesis-1.5 L of clear blood-tinged peritoneal fluid drained -1/17 sustained a fall - 1/17, complaining of increased abdominal pain despite paracentesis, CT scan ordered pending - CT 1/18: Right lateral abdominal wall hematoma  Subjective: - Has some leg pain today, abdominal discomfort is a little better, no further nausea or vomiting, some shortness of breath  Assessment and Plan:  Acute on chronic hypoxic and hypercarbic respiratory failure ESRD on HD, volume overload, pulmonary edema Metabolic encephalopathy Noncompliance with HD -Worsening volume overload, pulmonary edema and pleural effusions on repeat x-ray again yesterday morning, has been refusing and cutting short some HD rx in ths Hospital as well - sp paracentesis 1/16 - Volume status finally improving, weight down over 35 LB - sp HD yesterday  Liver cirrhosis History of EtOH abuse, ascites Abdominal pain -Underwent paracentesis 1/9, 675 mL fluid drained, -sp repeat paracentesis 1/16, only  1.5 L drained, complains of increase pain and distention after this, CT >small ascites, cirrhosis, right lateral abdominal wall hematoma, will hold heparin   Right lateral abdominal wall hematoma - Sustained after fall this weekend - Hold heparin -day 2, supportive care  Acute on chronic diastolic CHF - Volume managed with HD and paracentesis - GDMT limited by ESRD  Transient chest pain Resolved Troponin minimally elevated, no ACS Echo with preserved EF, global hypokinesis  Acute on chronic anemia Hemoglobin down to 7 earlier transfused 1 unit of PRBC, anemia panel consistent with chronic disease - Hemoglobin around 7.4, monitor  Severe COPD/chronic respiratory failure Supposed to be on home oxygen  however noncompliant with homelessness - Continue current inhalers, supplemental O2  Hypertension Continue amlodipine  and Coreg   Anxiety/depression Continue BuSpar , dose decreased  Chronic pain/neuropathy Continue gabapentin , decrease dose  Goals, chronically ill debilitated homeless female with advanced COPD/chronic respiratory failure supposed to be on home oxygen  but does not use this due to homelessness, ESRD, diastolic CHF, liver cirrhosis and ascites, frequent hospitalizations, ongoing failure to thrive  - High risk of decompensation, discussed disease burden with the patient, palliative consulted   DVT prophylaxis: Heparin  subcutaneous Code Status: DNR Family Communication: No immediate family contacts, unable to reach daughter yesterday, will reattempt today Disposition Plan: Unknown  Consultants:    Procedures:   Antimicrobials:    Objective: Vitals:   04/06/24 0351 04/06/24 0500 04/06/24 0811 04/06/24 0848  BP: 103/84  139/88   Pulse: 64  76 76  Resp: 18  20 20   Temp: 98.5 F (36.9 C)  98 F (36.7 C)   TempSrc: Oral  Oral   SpO2: 100%  99% 99%  Weight:  61.6 kg  Height:        Intake/Output Summary (Last 24 hours) at 04/06/2024 1001 Last data filed  at 04/06/2024 0813 Gross per 24 hour  Intake 840 ml  Output 3000 ml  Net -2160 ml   Filed Weights   04/05/24 1217 04/05/24 1607 04/06/24 0500  Weight: 63.7 kg 60.8 kg 61.6 kg    Examination:  General exam: Chronically ill, cachectic-AAO x 3 HEENT: Positive JVD CVS: S1-S2, regular rhythm Lungs: Decreased breath sounds to bases, few basilar rales Abdomen: Firm, mildly distended, positive fluid thrill right lateral abdominal wall tenderness Extremities: 1+ edema   Data Reviewed:   CBC: Recent Labs  Lab 03/31/24 0833 04/01/24 0257 04/02/24 0355 04/03/24 0248 04/03/24 2130 04/04/24 0308 04/05/24 0253 04/06/24 0247  WBC 8.8   < > 7.6 7.1  --  6.1 6.6 5.6  NEUTROABS 7.6  --   --   --   --   --   --   --   HGB 7.4*   < > 7.0* 7.0* 7.7* 7.4* 7.5* 7.3*  HCT 23.5*   < > 22.6* 21.4* 23.7* 22.5* 23.5* 22.8*  MCV 100.0   < > 98.3 97.3  --  96.2 97.5 98.7  PLT 255   < > 257 247  --  192 196 193   < > = values in this interval not displayed.   Basic Metabolic Panel: Recent Labs  Lab 03/31/24 0834 04/01/24 0705 04/02/24 0355 04/04/24 0308 04/05/24 0253 04/06/24 0247  NA 127* 132* 128* 125* 115* 125*  K 4.4 4.1 3.5 4.7 5.6* 4.8  CL 90* 94* 90* 89* 80* 88*  CO2 26 28 26 28 23 28   GLUCOSE 81 86 105* 85 100* 86  BUN 34* 20 23 25* 36* 23  CREATININE 3.62* 2.59* 2.25* 2.71* 3.33* 2.24*  CALCIUM  8.3* 8.5* 8.2* 8.2* 8.4* 8.4*  PHOS 5.3*  --   --   --   --   --    GFR: Estimated Creatinine Clearance: 25 mL/min (A) (by C-G formula based on SCr of 2.24 mg/dL (H)). Liver Function Tests: Recent Labs  Lab 03/31/24 0833 03/31/24 0834  AST 16  --   ALT 15  --   ALKPHOS 105  --   BILITOT 0.3  --   PROT 6.6  --   ALBUMIN  3.6 3.5   No results for input(s): LIPASE, AMYLASE in the last 168 hours. No results for input(s): AMMONIA in the last 168 hours. Coagulation Profile: No results for input(s): INR, PROTIME in the last 168 hours. Cardiac Enzymes: No results for  input(s): CKTOTAL, CKMB, CKMBINDEX, TROPONINI in the last 168 hours. BNP (last 3 results) Recent Labs    03/10/24 0055 03/13/24 2100 03/23/24 1850  PROBNP >35,000.0* >35,000.0* >35,000.0*   HbA1C: No results for input(s): HGBA1C in the last 72 hours. CBG: No results for input(s): GLUCAP in the last 168 hours. Lipid Profile: No results for input(s): CHOL, HDL, LDLCALC, TRIG, CHOLHDL, LDLDIRECT in the last 72 hours. Thyroid Function Tests: No results for input(s): TSH, T4TOTAL, FREET4, T3FREE, THYROIDAB in the last 72 hours. Anemia Panel: No results for input(s): VITAMINB12, FOLATE, FERRITIN, TIBC, IRON , RETICCTPCT in the last 72 hours.  Urine analysis:    Component Value Date/Time   COLORURINE YELLOW 12/30/2023 1305   APPEARANCEUR CLOUDY (A) 12/30/2023 1305   LABSPEC 1.011 12/30/2023 1305   PHURINE 6.0 12/30/2023 1305   GLUCOSEU 50 (A) 12/30/2023 1305   HGBUR SMALL (A) 12/30/2023 1305   BILIRUBINUR  NEGATIVE 12/30/2023 1305   KETONESUR NEGATIVE 12/30/2023 1305   PROTEINUR 100 (A) 12/30/2023 1305   UROBILINOGEN 0.2 03/02/2010 2027   NITRITE NEGATIVE 12/30/2023 1305   LEUKOCYTESUR LARGE (A) 12/30/2023 1305   Sepsis Labs: @LABRCNTIP (procalcitonin:4,lacticidven:4)  ) No results found for this or any previous visit (from the past 240 hours).    Radiology Studies: CT ABDOMEN PELVIS W CONTRAST Result Date: 04/04/2024 CLINICAL DATA:  Abdominal pain with EXAM: CT ABDOMEN AND PELVIS WITH CONTRAST TECHNIQUE: Multidetector CT imaging of the abdomen and pelvis was performed using the standard protocol following bolus administration of intravenous contrast. RADIATION DOSE REDUCTION: This exam was performed according to the departmental dose-optimization program which includes automated exposure control, adjustment of the mA and/or kV according to patient size and/or use of iterative reconstruction technique. CONTRAST:  75mL OMNIPAQUE  IOHEXOL   350 MG/ML SOLN COMPARISON:  CT abdomen pelvis dated 01/06/2024. FINDINGS: Lower chest: Partially visualized moderate left pleural effusion with partial compressive atelectasis of the left lower lobe relatively similar or slightly progressed since the prior CT. Pneumonia is not excluded. Additional right lung base subsegmental consolidation may represent atelectasis or infiltrate. No intra-abdominal free air.  Small ascites. Hepatobiliary: There is slight irregularity of the liver contour in keeping with cirrhosis. No acutely ductal dilatation. The gallbladder is unremarkable. Pancreas: Unremarkable. No pancreatic ductal dilatation or surrounding inflammatory changes. Spleen: Normal in size without focal abnormality. Adrenals/Urinary Tract: The adrenal glands unremarkable. Moderate bilateral renal parenchyma atrophy. The right kidney is located in the inferior abdomen. There is no hydronephrosis on either side. The urinary bladder is minimally distended and grossly unremarkable. Stomach/Bowel: There is no bowel obstruction. The appendix is normal. Vascular/Lymphatic: Advanced aortoiliac atherosclerotic disease. The IVC is unremarkable. No portal venous gas. There is no adenopathy. Reproductive: The uterus is grossly under. No suspicious adnexal masses. Other: Diffuse abdominal wall edema and anasarca. Indeterminate ill-defined ovoid high attenuating area in the right lateral abdominal wall musculature (51/13 and coronal 57/6) measuring approximately 6.5 x 3.0 cm. This is not characterized but may represent an intramuscular hematoma. Attention on follow-up imaging recommended. Musculoskeletal: Osteopenia with degenerative changes. No acute osseous pathology. Right femoral head avascular necrosis. No cortical collapse. IMPRESSION: 1. No bowel obstruction. Normal appendix. 2. Cirrhosis with small ascites and anasarca, worsened since the prior CT. 3. Partially visualized moderate left pleural effusion with partial  compressive atelectasis of the left lower lobe. Pneumonia is not excluded. 4. Indeterminate ill-defined ovoid high attenuating area in the right lateral abdominal wall musculature may represent an intramuscular hematoma. Attention on follow-up imaging recommended. 5.  Aortic Atherosclerosis (ICD10-I70.0). Electronically Signed   By: Vanetta Chou M.D.   On: 04/04/2024 17:30      Scheduled Meds:  sodium chloride    Intravenous Once   amLODipine   10 mg Oral Daily   atorvastatin   10 mg Oral Daily   budesonide -glycopyrrolate -formoterol   2 puff Inhalation BID   busPIRone   5 mg Oral BID   calcitRIOL   0.25 mcg Oral Daily   carvedilol   6.25 mg Oral BID WC   Chlorhexidine  Gluconate Cloth  6 each Topical Q0600   [START ON 04/07/2024] darbepoetin (ARANESP ) injection - DIALYSIS  150 mcg Subcutaneous Q Wed-1800   folic acid   1 mg Oral Daily   gabapentin   100 mg Oral QHS   multivitamin with minerals  1 tablet Oral Daily   pantoprazole   40 mg Oral BID   thiamine   100 mg Oral Daily   torsemide   100 mg Oral Daily  Continuous Infusions:     LOS: 13 days    Time spent:    Sigurd Pac, MD [facility]   04/06/2024, 10:01 AM    "

## 2024-04-06 NOTE — Consult Note (Signed)
 "                              Consultation Note Date: 04/06/2024   Patient Name: [name]  DOB: 1960-08-18  MRN: [id number]  Age / Sex: 64 y.o., female  PCP: Pcp, No Referring Physician: Fairy Frames, MD  Reason for Consultation: Establishing goals of care  HPI/Patient Profile: 64 y.o. female  with past medical history of ESRD on HD with history of noncompliance due to homelessness, HFpEF, COPD, polysubstance abuse, hypertension, hyperlipidemia, GERD, anxiety, depression, anemia, hepatic cirrhosis admitted on 03/23/2024 with worsening shortness of breath and chest pain.   Patient was initially admitted for acute on chronic respiratory failure secondary to volume overload in the setting of missed dialysis, acute on chronic diastolic CHF.  Hospitalization was further complicated by agitation at times, abdominal distention requiring paracentesis, in hospital fall, right abdominal wall hematoma sustained from fall and seen on CT, acute on chronic anemia.  Concern for high risk of decompensation and high disease burden, failure to thrive .  Patient has had 5 admissions over the past 6 months, including most recently less than a month ago.  She has not been to dialysis since then.  PMT has been consulted to assist with goals of care conversation.  Clinical Assessment and Goals of Care:  I have reviewed medical records including all EPIC notes from 25 and 1/20, H&P, all previous encounters with PMT, labs and imaging.  Discussed with Dr. Geralynn at bedside, assessed the patient and discussed diagnosis, prognosis, GOC, EOL wishes, disposition and options.  I introduced Palliative Medicine as specialized medical care for people living with serious illness. It focuses on providing relief from the symptoms and stress of a serious illness. The goal is to improve quality of life for both the patient and the family.  We discussed a brief life review of the patient and then focused on their current  illness.   I attempted to elicit values and goals of care important to the patient.    Medical History Review and Understanding:  We discussed patient's acute illness in the context of their chronic comorbidities.  She understands that she has several chronic comorbidities involving her heart, lungs, kidneys, liver and that this is expected to ultimately worsen and take her life.  Social History: Patient is homeless.  She has 2 daughters but not very close to them.  She is a Curator.  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  She is very limited by her symptoms and unable to do much.  Functional and Nutritional State: Patient ambulates with a cane, though this has gone missing since admission.  Albumin  is 3.5 as of 1/14.  Palliative Symptoms: Dyspnea, chest pressure, fatigue, bilateral lower extremity edema  Advance Directives: A detailed discussion regarding advanced directives was had.  Review documentation on file.  Her primary HCPOA is her daughter [name] and secondary is [name].  Code Status: Concepts specific to code status, artifical feeding and hydration, and rehospitalization were considered and discussed.  Patient confirms DNR/DNI.  She would never want a feeding tube.  Discussion: During our conversation, patient expressed her clear understanding of the severity of her illness and unlikelihood that she would improve from her current functional status and quality of life.  Emotional support therapeutic listening was provided as she reflected on losing her support people throughout her life, her father's death when she was 46,  her anxiety about lack of outpatient resources, her uncertainty whether she has Medicaid at this time, and her feelings of needing more spiritual involvement in her life.  She used to go to church with her family and appreciated having her Bible in prison.  She is also still trying to process her son's suicide and the  spiritual implications of this and her belief system.  Patient did not feel her daughter would be willing to participate in GOC conversations.  Confirmed that she never wanted [name]-term dialysis, though she is not ready to die either.  She still wants to look up the obituaries of her loved ones and feels that she has more emotional processing to do before she dies.  We had a [place] conversation about her poor quality of life and the importance of considering quality of life when making treatment plans and anticipating future decisions, treatment options.  She would like to continue life-prolonging measures to maintain her current quality of life.  She agrees to this even understanding that it is unlikely she will ever feel better than she currently does.  She would not find it acceptable to be bedbound and would reconsider her care plan at that time.  She is hopeful for placement in a rehab facility.  I provided counseling on the difference between a rehab facility and a hospice facility.   The difference between aggressive medical intervention and comfort care was considered in light of the patient's goals of care. Hospice and Palliative Care services outpatient were explained and offered.   Discussed the importance of continued conversation with family and the medical providers regarding overall plan of care and treatment options, ensuring decisions are within the context of the patients values and GOCs.   Questions and concerns were addressed.  Hard Choices booklet left for review. The family was encouraged to call with questions or concerns.  PMT will continue to support holistically.    SUMMARY OF RECOMMENDATIONS   - Continue DNR/DNI - Continue current care plan.  Patient finds her current quality of life tolerable and agrees with life-prolonging measures for now - Patient would never want a feeding tube or to be bedbound.  Ongoing goals of care discussions - Spiritual care consult previously  placed. Discussed with chaplain - TOC consulted for assistance with patient's inquiry into whether she has Medicaid - Psychosocial and emotional support provided - PMT Will continue to follow and support  Prognosis:  poor  Discharge Planning: To Be Determined      Primary Diagnoses: Present on Admission:  Volume overload  Hyperkalemia  COPD (chronic obstructive pulmonary disease) (HCC)   Physical Exam Vitals and nursing note reviewed.  Constitutional:      General: She is not in acute distress.    Appearance: She is ill-appearing.  HENT:     Head: Normocephalic and atraumatic.  Cardiovascular:     Rate and Rhythm: Normal rate.  Pulmonary:     Effort: Pulmonary effort is normal.  Neurological:     Mental Status: She is alert and oriented to person, place, and time.  Psychiatric:        Cognition and Memory: Cognition normal.     Comments: Intermittently tearful    Vital Signs: BP 139/88 (BP Location: Left Arm)   Pulse 76   Temp 98 F (36.7 C) (Oral)   Resp 20   Ht 5' 7 (1.702 m)   Wt 61.6 kg   SpO2 99%   BMI 21.28 kg/m  Pain Scale: 0-10  POSS *See Group Information*: 1-Acceptable,Awake and alert Pain Score: 10-Worst pain ever   SpO2: SpO2: 99 % O2 Device:SpO2: 99 % O2 Flow Rate: .O2 Flow Rate (L/min): 4 L/min   Shaindel Sweeten SHAUNNA Fell, PA-C  Palliative Medicine Team Team phone # [phone]  Thank you for allowing the Palliative Medicine Team to assist in the care of this patient. Please utilize secure chat with additional questions, if there is no response within 30 minutes please call the above phone number.  Palliative Medicine Team providers are available by phone from 7am to 7pm daily and can be reached through the team cell phone.  Should this patient require assistance outside of these hours, please call the patient's attending physician.   I personally spent a total of 80 minutes in the care of the patient today including preparing to see the  patient, getting/reviewing separately obtained history, performing a medically appropriate exam/evaluation, counseling and educating, placing orders, referring and communicating with other health care professionals, documenting clinical information in the EHR, and coordinating care.  "

## 2024-04-07 MED ORDER — MELATONIN 3 MG PO TABS
3.0000 mg | ORAL_TABLET | Freq: Every evening | ORAL | Status: DC | PRN
  Administered 2024-04-07 – 2024-04-15 (×5): 3 mg via ORAL
  Filled 2024-04-07 (×5): qty 1

## 2024-04-07 MED ORDER — PENTAFLUOROPROP-TETRAFLUOROETH EX AERO: 1.0000 | INHALATION_SPRAY | CUTANEOUS | Status: DC | PRN

## 2024-04-07 MED ORDER — NICOTINE 14 MG/24HR TD PT24
14.0000 mg | MEDICATED_PATCH | Freq: Every day | TRANSDERMAL | Status: DC
  Administered 2024-04-07 – 2024-04-15 (×9): 14 mg via TRANSDERMAL
  Filled 2024-04-07 (×8): qty 1

## 2024-04-07 MED ORDER — LIDOCAINE HCL (PF) 1 % IJ SOLN: 5.0000 mL | INTRAMUSCULAR | Status: DC | PRN

## 2024-04-07 MED ORDER — ANTICOAGULANT SODIUM CITRATE 4% (200MG/5ML) IV SOLN: 5.0000 mL | Status: DC | PRN

## 2024-04-07 MED ORDER — LIDOCAINE-PRILOCAINE 2.5-2.5 % EX CREA: 1.0000 | TOPICAL_CREAM | CUTANEOUS | Status: DC | PRN

## 2024-04-07 MED ORDER — HEPARIN SODIUM (PORCINE) 1000 UNIT/ML DIALYSIS: 1000.0000 [IU] | INTRAMUSCULAR | Status: DC | PRN

## 2024-04-07 MED ORDER — ALTEPLASE 2 MG IJ SOLR: 2.0000 mg | Freq: Once | INTRAMUSCULAR | Status: DC | PRN

## 2024-04-07 NOTE — Progress Notes (Signed)
 " [facility] Progress Note   Subjective:    Seen in room Wants to be dc'd today  Objective Vitals:   04/06/24 2331 04/07/24 0400 04/07/24 0500 04/07/24 0755  BP: (!) 146/64 139/72  (!) 151/73  Pulse: 66 64  71  Resp: 18 11  18   Temp: (!) 97.5 F (36.4 C) 97.7 F (36.5 C)  (!) 97.5 F (36.4 C)  TempSrc: Oral Oral  Oral  SpO2: 90% 96%  93%  Weight:   63.7 kg   Height:       Physical Exam General: chronically ill appearing, 4 L [place] O2 Heart: RRR Lungs: coarse breath sounds, normal wob Abdomen: distended 1-2+  Extremities: 1+ bilat LE edema Dialysis Access: RU AVF +b    Dialysis Orders: from 2024 -->  3h  B350 Heparin  none  RUA AVF No dialysis center currently    Assessment/Plan  Volume overload, AHRF - CXR 1/14 +pulm edema  - came in 70-73kg, is now down to 57- 63kg - on 4 L [place] O2 - still has excess vol, very difficult to keep her volume down - plan is for HD today, max UF w/ HD  Agitation/ AMS - required CIWA protocol last week - stable now   Liver cirrhosis, Ascites - s/p paracentesis 0.67 L on 1/9 - s/p paracentesis 1.5 L on 1/16  Chronic hyponatremia - will continue to attempt to manage with HD/UF - Na+ 115 yest, up to 125 today after HD - changed back to renal diet w/ 1200 cc FR  ESRD - does not have a regular HD center - has been very noncompliant with outpt HD, therefore no HD unit - as inpatient we are on MWF schedule - next HD today  Chronic homelessness  Anemia of CKD - Hgb 7.0, Venofer  x 2 ordered, ESA started 1/14 and dose increased - Iron  replete - Hb 7-8 range here   HTN - bp's are normal on coreg  and norvasc   Nutrition - changed back to renal diet  - fluid restriction .  COPD - continue home meds  GOC  - pt is DNR-Limited - poor prognosis, pt is aware - met w/ palliative -> wants to continue HD for now     Myer Fret  MD  [facility] 04/07/2024, 11:43 AM  Recent Labs  Lab 04/05/24 0253  04/06/24 0247  HGB 7.5* 7.3*  CALCIUM  8.4* 8.4*  CREATININE 3.33* 2.24*  K 5.6* 4.8    Inpatient medications:  sodium chloride    Intravenous Once   amLODipine   10 mg Oral Daily   atorvastatin   10 mg Oral Daily   budesonide -glycopyrrolate -formoterol   2 puff Inhalation BID   busPIRone   5 mg Oral BID   calcitRIOL   0.25 mcg Oral Daily   carvedilol   6.25 mg Oral BID WC   Chlorhexidine  Gluconate Cloth  6 each Topical Q0600   Chlorhexidine  Gluconate Cloth  6 each Topical Q0600   darbepoetin (ARANESP ) injection - DIALYSIS  150 mcg Subcutaneous Q Wed-1800   folic acid   1 mg Oral Daily   gabapentin   100 mg Oral QHS   multivitamin with minerals  1 tablet Oral Daily   nicotine   14 mg Transdermal Daily   pantoprazole   40 mg Oral BID   thiamine   100 mg Oral Daily   torsemide   100 mg Oral Daily    anticoagulant sodium citrate      acetaminophen  **OR** acetaminophen , alteplase , alum & mag hydroxide-simeth, anticoagulant sodium citrate , heparin , ipratropium-albuterol , lidocaine  (PF), lidocaine -prilocaine ,  melatonin, mouth rinse, oxyCODONE , pentafluoroprop-tetrafluoroeth         "

## 2024-04-07 NOTE — Progress Notes (Signed)
 "                                                                                                                                                         Daily Progress Note   Patient Name: [name]       Date: 04/07/2024 DOB: 1960-11-01  Age: 64 y.o. MRN#: [id number] Attending Physician: Fairy Frames, MD Primary Care Physician: Pcp, No Admit Date: 03/23/2024  Reason for Follow-up: Establishing goals of care  Subjective: Patient assessed at the bedside. Discussed with RN as he provided her requested nicotine  patch.  Patient is very frustrated with her support system and also with timing of dialysis this morning -she just wanted to eat her breakfast before going.  She shared much of the same other frustrations from yesterday.  Emotional support and therapeutic listening was provided.  Created space and opportunity for patient's thoughts and feelings on patient's her illness.  She states explicitly I am dying and her understanding that it is not if but when.  This causes her extra stress given her family's minimal support as such a difficult time in her life.  She remains interested in completing dialysis today and into the future until she is ready for hospice.  She would also like to speak with [name] from financial and asked for a referral to gynecology in the outpatient setting.  Provided update to [facility] and primary attending.  Questions and concerns addressed. PMT will continue to support holistically.  Objective: Medical records reviewed including all progress notes from 1/21 and labs.  Sodium improved from yesterday.  Creatinine improved from 3.33 to 2.24 today.  Physical Exam Vitals and nursing note reviewed.  Constitutional:      General: She is not in acute distress.    Appearance: She is ill-appearing.     Interventions: Nasal cannula in place.  HENT:     Head: Normocephalic and atraumatic.  Cardiovascular:     Rate and Rhythm: Normal rate.  Pulmonary:     Effort:  Pulmonary effort is normal. No tachypnea.  Abdominal:     General: There is distension.  Skin:    General: Skin is warm and dry.  Neurological:     Mental Status: She is alert and oriented to person, place, and time.  Psychiatric:        Mood and Affect: Affect is labile.        Behavior: Behavior is cooperative.        Cognition and Memory: Cognition normal.     Comments: Tearful after ending phone call with her ex about her housing            Vital Signs: BP (!) 151/73 (BP Location: Left Arm)   Pulse 71   Temp (!) 97.5 F (36.4 C) (Oral)   Resp 18  Ht 5' 7 (1.702 m)   Wt 63.7 kg   SpO2 93%   BMI 21.99 kg/m  SpO2: SpO2: 93 % O2 Device: O2 Device: Nasal Cannula O2 Flow Rate: O2 Flow Rate (L/min): 2 L/min      Palliative Assessment/Data:    Palliative Care Assessment & Plan   Patient Profile: 64 y.o. female  with past medical history of ESRD on HD with history of noncompliance due to homelessness, HFpEF, COPD, polysubstance abuse, hypertension, hyperlipidemia, GERD, anxiety, depression, anemia, hepatic cirrhosis admitted on 03/23/2024 with worsening shortness of breath and chest pain.    Patient was initially admitted for acute on chronic respiratory failure secondary to volume overload in the setting of missed dialysis, acute on chronic diastolic CHF.  Hospitalization was further complicated by agitation at times, abdominal distention requiring paracentesis, in hospital fall, right abdominal wall hematoma sustained from fall and seen on CT, acute on chronic anemia.  Concern for high risk of decompensation and high disease burden, failure to thrive .   Patient has had 5 admissions over the past 6 months, including most recently less than a month ago.  She has not been to dialysis since then.  PMT has been consulted to assist with goals of care conversation.  Determined that patient is agreeable with continuing life-prolonging interventions.  Follow-up today for additional thoughts  and feelings, anticipatory care needs, palliative support.  Assessment: Goals of care conversation End-stage renal disease on dialysis Liver cirrhosis Acute on chronic respiratory failure with volume overload and pulmonary edema Acute on chronic diastolic CHF  Recommendations/Plan: Continue DNR/DNI Continue current care plan.  Patient continues to state that she is at peace with her mortality and expected disease trajectory but not ready to die just yet Reached out to [facility] at her request to follow-up with the financial team again Informed primary attending of her request for a gynecology referral Psychosocial and emotional support provided PMT will continue to follow and support as needed   Prognosis: Poor  Discharge Planning: To Be Determined  Care plan was discussed with patient, RN, MD, LCSW          [name] Cynthia Fell, PA-C  Palliative Medicine Team Team phone # [phone]  Thank you for allowing the Palliative Medicine Team to assist in the care of this patient. Please utilize secure chat with additional questions, if there is no response within 30 minutes please call the above phone number.  Palliative Medicine Team providers are available by phone from 7am to 7pm daily and can be reached through the team cell phone.  Should this patient require assistance outside of these hours, please call the patient's attending physician.   I personally spent a total of 35 minutes in the care of the patient today including preparing to see the patient, getting/reviewing separately obtained history, performing a medically appropriate exam/evaluation, counseling and educating, referring and communicating with other health care professionals, and documenting clinical information in the EHR.  "

## 2024-04-07 NOTE — Progress Notes (Signed)
 " PROGRESS NOTE    [name]  FMW:[id number] DOB: 07/23/1960 DOA: 03/23/2024 PCP: Pcp, No   63/F w COPD supposed to be on home oxygen , ESRD on HD, noncompliance due to homelessness, polysubstance abuse, diastolic CHF, hypertension, dyslipidemia, anxiety, cirrhosis, ascites back in the ED with chest pain and shortness of breath.  Patient is a poor historian.  She is reporting substernal chest heaviness and shortness of breath for several days.  She was admitted to the hospital last month for volume overload and has not gone for dialysis since she was discharged 2 weeks ago.   - Admitted, started on hemodialysis, volume status improving - 1/13 -14 noted to have worsening agitation and confusion, ABG with severe respiratory acidosis, placed on BiPAP, chest x-ray with worsening pulmonary edema -has been refusing some HD Rx even in the hospital and cutting HD short - 1/16, respiratory status improving, increased abdominal distention, paracentesis-1.5 L of clear blood-tinged peritoneal fluid drained -1/17 sustained a fall - 1/17, complaining of increased abdominal pain despite paracentesis, CT scan ordered pending - CT 1/18: Right lateral abdominal wall hematoma  Subjective: - Complains of leg pain, abdominal pain, waiting for dialysis, short of breath today  Assessment and Plan:  Acute on chronic hypoxic and hypercarbic respiratory failure ESRD on HD, volume overload, pulmonary edema Metabolic encephalopathy Noncompliance with HD -Worsening volume overload, pulmonary edema and pleural effusions on repeat x-ray again yesterday morning, has been refusing and cutting short some HD rx in ths Hospital as well - sp paracentesis 1/16 - Still appears volume overloaded,, down> 20 pounds, wt trending up again - For HD today  Liver cirrhosis History of EtOH abuse, ascites Abdominal pain -Underwent paracentesis 1/9, 675 mL fluid drained, -sp repeat paracentesis 1/16, only 1.5 L drained, complains  of increase pain and distention after this, CT >small ascites, cirrhosis, right lateral abdominal wall hematoma, will hold heparin   Right lateral abdominal wall hematoma - Sustained after fall this weekend - Hold heparin  SQ-day 3, supportive care  Acute on chronic diastolic CHF - Volume managed with HD and paracentesis - GDMT limited by ESRD  Transient chest pain Resolved Troponin minimally elevated, no ACS Echo with preserved EF, global hypokinesis  Acute on chronic anemia Hemoglobin down to 7 earlier transfused 1 unit of PRBC, anemia panel consistent with chronic disease - Hemoglobin around 7.4, monitor  Severe COPD/chronic respiratory failure Supposed to be on home oxygen  however noncompliant with homelessness - Continue current inhalers, supplemental O2  Hypertension Continue amlodipine  and Coreg   Anxiety/depression Continue BuSpar , dose decreased  Chronic pain/neuropathy Continue gabapentin , decrease dose  Goals, chronically ill debilitated homeless female with advanced COPD/chronic respiratory failure supposed to be on home oxygen  but does not use this due to homelessness, ESRD, diastolic CHF, liver cirrhosis and ascites, frequent hospitalizations, ongoing failure to thrive  - High risk of decompensation, discussed significant disease burden with the patient, palliative consulted, patient remains DNR, wishes to continue current care   DVT prophylaxis: Heparin  subcutaneous on hold Code Status: DNR Family Communication: No immediate family contacts, unable to reach daughter yesterday, will reattempt today Disposition Plan: Unknown  Consultants:    Procedures:   Antimicrobials:    Objective: Vitals:   04/06/24 2331 04/07/24 0400 04/07/24 0500 04/07/24 0755  BP: (!) 146/64 139/72  (!) 151/73  Pulse: 66 64  71  Resp: 18 11  18   Temp: (!) 97.5 F (36.4 C) 97.7 F (36.5 C)  (!) 97.5 F (36.4 C)  TempSrc: Oral Oral  Oral  SpO2: 90% 96%  93%  Weight:   63.7 kg    Height:        Intake/Output Summary (Last 24 hours) at 04/07/2024 1102 Last data filed at 04/07/2024 0904 Gross per 24 hour  Intake 720 ml  Output --  Net 720 ml   Filed Weights   04/05/24 1607 04/06/24 0500 04/07/24 0500  Weight: 60.8 kg 61.6 kg 63.7 kg    Examination:  General exam: Chronically ill, cachectic-AAO x 3 HEENT: Positive JVD CVS: S1-S2, regular rhythm Lungs: Decreased breath sounds to bases, few basilar rales Abdomen: Firm, mildly distended, positive fluid thrill right lateral abdominal wall tenderness Extremities: 1+ edema   Data Reviewed:   CBC: Recent Labs  Lab 04/02/24 0355 04/03/24 0248 04/03/24 2130 04/04/24 0308 04/05/24 0253 04/06/24 0247  WBC 7.6 7.1  --  6.1 6.6 5.6  HGB 7.0* 7.0* 7.7* 7.4* 7.5* 7.3*  HCT 22.6* 21.4* 23.7* 22.5* 23.5* 22.8*  MCV 98.3 97.3  --  96.2 97.5 98.7  PLT 257 247  --  192 196 193   Basic Metabolic Panel: Recent Labs  Lab 04/01/24 0705 04/02/24 0355 04/04/24 0308 04/05/24 0253 04/06/24 0247  NA 132* 128* 125* 115* 125*  K 4.1 3.5 4.7 5.6* 4.8  CL 94* 90* 89* 80* 88*  CO2 28 26 28 23 28   GLUCOSE 86 105* 85 100* 86  BUN 20 23 25* 36* 23  CREATININE 2.59* 2.25* 2.71* 3.33* 2.24*  CALCIUM  8.5* 8.2* 8.2* 8.4* 8.4*   GFR: Estimated Creatinine Clearance: 25 mL/min (A) (by C-G formula based on SCr of 2.24 mg/dL (H)). Liver Function Tests: No results for input(s): AST, ALT, ALKPHOS, BILITOT, PROT, ALBUMIN  in the last 168 hours.  No results for input(s): LIPASE, AMYLASE in the last 168 hours. No results for input(s): AMMONIA in the last 168 hours. Coagulation Profile: No results for input(s): INR, PROTIME in the last 168 hours. Cardiac Enzymes: No results for input(s): CKTOTAL, CKMB, CKMBINDEX, TROPONINI in the last 168 hours. BNP (last 3 results) Recent Labs    03/10/24 0055 03/13/24 2100 03/23/24 1850  PROBNP >35,000.0* >35,000.0* >35,000.0*   HbA1C: No results for  input(s): HGBA1C in the last 72 hours. CBG: No results for input(s): GLUCAP in the last 168 hours. Lipid Profile: No results for input(s): CHOL, HDL, LDLCALC, TRIG, CHOLHDL, LDLDIRECT in the last 72 hours. Thyroid Function Tests: No results for input(s): TSH, T4TOTAL, FREET4, T3FREE, THYROIDAB in the last 72 hours. Anemia Panel: No results for input(s): VITAMINB12, FOLATE, FERRITIN, TIBC, IRON , RETICCTPCT in the last 72 hours.  Urine analysis:    Component Value Date/Time   COLORURINE YELLOW 12/30/2023 1305   APPEARANCEUR CLOUDY (A) 12/30/2023 1305   LABSPEC 1.011 12/30/2023 1305   PHURINE 6.0 12/30/2023 1305   GLUCOSEU 50 (A) 12/30/2023 1305   HGBUR SMALL (A) 12/30/2023 1305   BILIRUBINUR NEGATIVE 12/30/2023 1305   KETONESUR NEGATIVE 12/30/2023 1305   PROTEINUR 100 (A) 12/30/2023 1305   UROBILINOGEN 0.2 03/02/2010 2027   NITRITE NEGATIVE 12/30/2023 1305   LEUKOCYTESUR LARGE (A) 12/30/2023 1305   Sepsis Labs: @LABRCNTIP (procalcitonin:4,lacticidven:4)  ) No results found for this or any previous visit (from the past 240 hours).    Radiology Studies: No results found.     Scheduled Meds:  sodium chloride    Intravenous Once   amLODipine   10 mg Oral Daily   atorvastatin   10 mg Oral Daily   budesonide -glycopyrrolate -formoterol   2 puff Inhalation BID   busPIRone   5 mg Oral  BID   calcitRIOL   0.25 mcg Oral Daily   carvedilol   6.25 mg Oral BID WC   Chlorhexidine  Gluconate Cloth  6 each Topical Q0600   Chlorhexidine  Gluconate Cloth  6 each Topical Q0600   darbepoetin (ARANESP ) injection - DIALYSIS  150 mcg Subcutaneous Q Wed-1800   folic acid   1 mg Oral Daily   gabapentin   100 mg Oral QHS   multivitamin with minerals  1 tablet Oral Daily   nicotine   14 mg Transdermal Daily   pantoprazole   40 mg Oral BID   thiamine   100 mg Oral Daily   torsemide   100 mg Oral Daily   Continuous Infusions:  anticoagulant sodium citrate          LOS: 14 days    Time spent:    Sigurd Pac, MD [facility]   04/07/2024, 11:02 AM    "

## 2024-04-07 NOTE — Care Management Important Message (Signed)
 Important Message  Patient Details  Name: [name] MRN: [id number] Date of Birth: [birth year]   Important Message Given:  Yes - Medicare IM     [name] 04/07/2024, 8:18 AM

## 2024-04-07 NOTE — Progress Notes (Signed)
 Pt. Came in awake and oriented with no complaints Consent verified and on file. Started with no complaints  UF removal:2100 ml Tx duration: 2.26 hours  Access used: Right AVF Access issue: None  Tx completed and tolerated. Pressure dressing applied Endorsed to floor nurse Transported to room.  Fina Heizer Rubi Junella Domke, RN [facility]

## 2024-04-07 NOTE — Progress Notes (Signed)
 This chaplain is present for F/U spiritual care. The chaplain listened reflectively as the Pt. shares her new knowledge about medicaid and eligibility requirements, along with her discontent about not receiving HD today. During the conversation, the Pt. is able to tell the difference between head and heart decisions and accepts discharge on Thursday.  This chaplain is available for F/U spiritual care as needed.  Chaplain Leeroy Hummer [phone]

## 2024-04-07 NOTE — TOC Progression Note (Addendum)
 Transition of Care [facility]) - Progression Note    Patient Details  Name: [name] MRN: [id number] Date of Birth: [birth year]  Transition of Care [facility]) CM/SW Contact  Luise JAYSON Pan, [place] Phone Number: 04/07/2024, 12:50 PM  Clinical Narrative:   CSW reviewed consult of patient wanting assistance contacting DSS about medicaid. CSW conducted a chart review and found that financial counseling was contacted to assist patient with Medicaid. CSW reached out to financial counseling to follow up with patient.   12:58 PM Per Rojelio with Financial Counseling, patient does not qualify for Medicaid. CSW will notify patient.  1:27 PM Patient asked for [facility] with FC to call her room. CSW informed Rojelio.  CSW cleared consult. No further action required at this time. Please place new consult if any needs arise.  Expected Discharge Plan:  (TBD) Barriers to Discharge: Continued Medical Work up               Expected Discharge Plan and Services In-house Referral: Clinical Social Work Discharge Planning Services: CM Consult   Living arrangements for the past 2 months: Hotel/Motel, Homeless                                       Social Drivers of Health (SDOH) Interventions SDOH Screenings   Food Insecurity: Food Insecurity Present (03/10/2024)  Housing: High Risk (03/26/2024)  Transportation Needs: Unmet Transportation Needs (03/26/2024)  Utilities: At Risk (03/26/2024)  Alcohol  Screen: Low Risk (02/10/2023)  Depression (PHQ2-9): Medium Risk (06/11/2023)  Financial Resource Strain: High Risk (02/10/2023)  Physical Activity: Inactive (02/10/2023)  Social Connections: Socially Isolated (03/10/2024)  Tobacco Use: High Risk (03/23/2024)  Health Literacy: Adequate Health Literacy (02/10/2023)    Readmission Risk Interventions    01/26/2024    4:49 PM 01/14/2024    9:38 AM 07/01/2023   12:39 PM  Readmission Risk Prevention Plan  Transportation Screening Complete Complete  Complete  Medication Review (RN Care Manager) Complete Referral to Pharmacy Referral to Pharmacy  PCP or Specialist appointment within 3-5 days of discharge  Complete Complete  HRI or Home Care Consult Complete Complete Complete  SW Recovery Care/Counseling Consult  Complete Complete  Palliative Care Screening Not Applicable Not Applicable Not Applicable  Skilled Nursing Facility Not Applicable Not Applicable Not Applicable

## 2024-04-07 NOTE — Progress Notes (Signed)
" °   04/07/24 1826  Vitals  Temp 97.9 F (36.6 C)  Pulse Rate 72  Resp 18  BP (!) 130/57  SpO2 (!) 86 %  O2 Device Nasal Cannula  Weight 61.7 kg  Oxygen  Therapy  O2 Flow Rate (L/min) 2 L/min  Patient Activity (if Appropriate) In bed  Pulse Oximetry Type Continuous  Oximetry Probe Site Changed No  Post Treatment  Dialyzer Clearance Lightly streaked  Hemodialysis Intake (mL) 0 mL  Liters Processed 58.4  Fluid Removed (mL) 2100 mL  Tolerated HD Treatment Yes  Post-Hemodialysis Comments Pt. early termination due to pt refused to stay on her prescribed time. Signed AMA  AVG/AVF Arterial Site Held (minutes) 10 minutes  AVG/AVF Venous Site Held (minutes) 10 minutes    "

## 2024-04-07 NOTE — Plan of Care (Addendum)
 Patient has been wanting to leave AMA, but I reminded her she will not get a cab voucher if she does, she accepted 1 more day.   She went to HD today, and didn't want the full 3h104m of HD. Got 2h85m of HD and 2.1L removed. Patient is walking around and not compliant to wait for staff to go to the bathroom or to return.  She was able to walk to the end of hallway to room one and back on Room Air, with front wheel walker. Immediately upon sitting back down was satting at 87 SpO2. And had to catch up for her O2 to return to baseline before leaving for HD.     Problem: Pain Managment: Goal: General experience of comfort will improve and/or be controlled Outcome: Progressing      Problem: Activity: Goal: Risk for activity intolerance will decrease Outcome: Progressing

## 2024-04-07 NOTE — Progress Notes (Signed)
 Pt. Refused to be dialyzed today

## 2024-04-08 LAB — BASIC METABOLIC PANEL WITH GFR
Anion gap: 10 (ref 5–15)
BUN: 24 mg/dL — ABNORMAL HIGH (ref 8–23)
CO2: 28 mmol/L (ref 22–32)
Calcium: 8.4 mg/dL — ABNORMAL LOW (ref 8.9–10.3)
Chloride: 88 mmol/L — ABNORMAL LOW (ref 98–111)
Creatinine, Ser: 2.45 mg/dL — ABNORMAL HIGH (ref 0.44–1.00)
GFR, Estimated: 21 mL/min — ABNORMAL LOW
Glucose, Bld: 96 mg/dL (ref 70–99)
Potassium: 4.4 mmol/L (ref 3.5–5.1)
Sodium: 127 mmol/L — ABNORMAL LOW (ref 135–145)

## 2024-04-08 LAB — CBC
HCT: 22.5 % — ABNORMAL LOW (ref 36.0–46.0)
Hemoglobin: 7.2 g/dL — ABNORMAL LOW (ref 12.0–15.0)
MCH: 31.4 pg (ref 26.0–34.0)
MCHC: 32 g/dL (ref 30.0–36.0)
MCV: 98.3 fL (ref 80.0–100.0)
Platelets: 205 K/uL (ref 150–400)
RBC: 2.29 MIL/uL — ABNORMAL LOW (ref 3.87–5.11)
RDW: 17.8 % — ABNORMAL HIGH (ref 11.5–15.5)
WBC: 5.6 K/uL (ref 4.0–10.5)
nRBC: 0 % (ref 0.0–0.2)

## 2024-04-08 MED ORDER — ALPRAZOLAM 0.5 MG PO TABS
0.2500 mg | ORAL_TABLET | Freq: Two times a day (BID) | ORAL | Status: DC | PRN
  Administered 2024-04-08 – 2024-04-15 (×13): 0.25 mg via ORAL
  Filled 2024-04-08 (×13): qty 1

## 2024-04-08 MED ORDER — CHLORHEXIDINE GLUCONATE CLOTH 2 % EX PADS
6.0000 | MEDICATED_PAD | Freq: Every day | CUTANEOUS | Status: DC
  Administered 2024-04-09 – 2024-04-14 (×5): 6 via TOPICAL

## 2024-04-08 NOTE — Progress Notes (Signed)
 " [facility] Progress Note   Subjective:    Seen in room Usual c/o's   Objective Vitals:   04/08/24 0159 04/08/24 0352 04/08/24 0746 04/08/24 1208  BP:  121/70 (!) 115/57 (!) 108/39  Pulse:      Resp:  (!) 22 (!) 24 20  Temp:  98 F (36.7 C) 97.8 F (36.6 C) 97.8 F (36.6 C)  TempSrc:  Oral Oral Oral  SpO2:      Weight: 61.6 kg     Height:       Physical Exam General: chronically ill appearing, 4 L [place] O2 Heart: RRR Lungs: coarse breath sounds, normal wob Abdomen: distended 1-2+  Extremities: 1+ bilat LE edema Dialysis Access: RU AVF +b    Dialysis Orders: from 2024 -->  3h  B350 Heparin  none  RUA AVF No dialysis center currently    Assessment/Plan  Volume overload, AHRF - CXR 1/14 +pulm edema  - came in 70-73kg, is now down to 57- 63kg - would like to get down to stay under 60kg if possible - on 4 L [place] O2 - still has excess vol, very difficult to keep her volume down - next HD tomorrow   Agitation/ AMS - required CIWA protocol last week - stable now   Liver cirrhosis, Ascites - s/p paracentesis 0.67 L on 1/9 - s/p paracentesis 1.5 L on 1/16  Chronic hyponatremia - will continue to attempt to manage with HD/UF - Na+ 115 yest, up to 125 today after HD - changed back to renal diet w/ 1200 cc FR  ESRD - does not have a regular HD center, when dc'd/ or if leaves AMA, patient knows to come to the ED for hospital HD  - has been very noncompliant with outpt HD, therefore no HD unit - as inpatient we are on MWF schedule - next HD tomorrow   Chronic homelessness  Anemia of CKD - Hgb 7.0, Venofer  x 2 ordered, ESA started 1/14 and dose increased - Iron  replete - Hb 7-8 range here   HTN - bp's are normal on coreg  and norvasc   Nutrition - changed back to renal diet  - fluid restriction .  COPD - continue home meds  GOC  - pt is DNR-Limited - poor prognosis, pt is aware - met w/ palliative -> wants to continue HD for  now     Myer Fret  MD  [facility] 04/08/2024, 3:43 PM  Recent Labs  Lab 04/06/24 0247 04/08/24 0218  HGB 7.3* 7.2*  CALCIUM  8.4* 8.4*  CREATININE 2.24* 2.45*  K 4.8 4.4    Inpatient medications:  sodium chloride    Intravenous Once   amLODipine   10 mg Oral Daily   atorvastatin   10 mg Oral Daily   budesonide -glycopyrrolate -formoterol   2 puff Inhalation BID   busPIRone   5 mg Oral BID   calcitRIOL   0.25 mcg Oral Daily   carvedilol   6.25 mg Oral BID WC   Chlorhexidine  Gluconate Cloth  6 each Topical Q0600   Chlorhexidine  Gluconate Cloth  6 each Topical Q0600   darbepoetin (ARANESP ) injection - DIALYSIS  150 mcg Subcutaneous Q Wed-1800   folic acid   1 mg Oral Daily   gabapentin   100 mg Oral QHS   multivitamin with minerals  1 tablet Oral Daily   nicotine   14 mg Transdermal Daily   pantoprazole   40 mg Oral BID   thiamine   100 mg Oral Daily   torsemide   100 mg Oral Daily  acetaminophen  **OR** acetaminophen , alum & mag hydroxide-simeth, ipratropium-albuterol , melatonin, mouth rinse, oxyCODONE          "
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
## 2024-04-08 NOTE — Progress Notes (Addendum)
 " PROGRESS NOTE    [name]  FMW:[id number] DOB: 1960-05-02 DOA: 03/23/2024 PCP: Pcp, No   64 y/o female with hx of  COPD supposed to be on home oxygen , ESRD on HD, noncompliance due to homelessness, polysubstance abuse, diastolic CHF, hypertension, dyslipidemia, anxiety, cirrhosis, ascites who presented to   ED with chest pain and shortness of breath.  Admitted, started on hemodialysis, volume status improving -On 1/13 -14 noted to have worsening agitation and confusion, ABG with severe respiratory acidosis, placed on BiPAP, chest x-ray with worsening pulmonary edema She has been refusing  HD Rx even in the hospital and cutting HD short On  1/16, respiratory status improving, increased abdominal distention, paracentesis-1.5 L of clear blood-tinged peritoneal fluid drained -1/17 sustained a fall - 1/17, complaining of increased abdominal pain despite paracentesis, CT scan ordered pending On  CT 1/18: Right lateral abdominal wall hematoma  Subjective: Seen today > she stated to be doing fine except her anxiety. Denies sob, no chest pain, no headache, no nausea, no vomiting, no abdominal pain . Patient told the Nurse that she want to seen by Gynecology due to vaginal ulcers and pain. Need to be examined by Gyn   Assessment and Plan:  Acute on chronic hypoxic and hypercarbic respiratory failure ESRD on HD, volume overload, pulmonary edema Metabolic encephalopathy Noncompliance with HD Volume overload due to  pulmonary edema and pleural effusions on repeat x-ray again yesterday morning. She had HD and paracentesis.She is improving.She does not complaint of sob. Still has oxygen  supplement with her but she stated that she does not need it.  S/P paracentesis 1/16 Noted to be noncompliance to HD  Continue HD per nephrology.  Liver cirrhosis History of EtOH abuse, ascites Abdominal pain S/p paracentesis 1/9, 675 mL fluid drained, And paracentesis 1/16, only 1.5 L drained, Repeat  CT  >small ascites, cirrhosis, right lateral abdominal wall hematoma Heparin  discontinued   Right lateral abdominal wall hematoma - Due to fall  Off  heparin  SQ-day 3 Continue  supportive care  Acute on chronic diastolic CHF  Volume managed with HD and paracentesis  Transient chest pain Resolved Troponin minimally elevated, no ACS Echo with preserved EF, global hypokinesis asymptomatic  Acute on chronic anemia Hemoglobin around 7.2 No active bleeding S/p transfusion 1 unit of packed RBC   Severe COPD/chronic respiratory failure Continue current inhalers and supplemental O2 as needed She told me that she does not use oxygen  all the night. Only as needed   Hypertension Continue amlodipine  and Coreg   Anxiety/depression Continue BuSpar   Chronic pain/neuropathy Continue gabapentin  Hyponatremia Due to volume overload. Improving with HD >levels are trending up Repeat BMP in am Vaginal Ulcers patient stated that she has vaginal ulcers and requested to be examined by Gyn  Gynecology consult requested   Noted  Goals, chronically ill debilitated homeless female with advanced COPD/chronic respiratory failure supposed to be on home oxygen  but does not use this due to homelessness, ESRD, diastolic CHF, liver cirrhosis and ascites, frequent hospitalizations, ongoing failure to thrive  - High risk of decompensation, discussed significant disease burden with the patient, palliative consulted, patient remains DNR, wishes to continue current care    DVT prophylaxis: Heparin  subcutaneous on hold Code Status: DNR Family Communication: No immediate family contacts, unable to reach daughter yesterday, will reattempt today Disposition Plan: Unknown at the moment     Objective: Vitals:   04/08/24 0159 04/08/24 0352 04/08/24 0746 04/08/24 1208  BP:  121/70 (!) 115/57 (!) 108/39  Pulse:  Resp:  (!) 22 (!) 24 20  Temp:  98 F (36.7 C) 97.8 F (36.6 C) 97.8 F (36.6 C)  TempSrc:   Oral Oral Oral  SpO2:      Weight: 61.6 kg     Height:        Intake/Output Summary (Last 24 hours) at 04/08/2024 1315 Last data filed at 04/08/2024 1306 Gross per 24 hour  Intake 960 ml  Output 2100 ml  Net -1140 ml   Filed Weights   04/07/24 1526 04/07/24 1826 04/08/24 0159  Weight: 63.5 kg 61.7 kg 61.6 kg    Examination:  Chronically ill. Awake and alert. Not is distress CVS: S1-S2, regular rhythm No JVD  Resp: Decreased breath sounds to bases, few basilar rales No wheezes,  no crackles  Abdomen exam  No tenderness, +bowel sound  Extremities: 1+ edema Neuro: No focal neurology deficit  Data Reviewed:   CBC: Recent Labs  Lab 04/03/24 0248 04/03/24 2130 04/04/24 0308 04/05/24 0253 04/06/24 0247 04/08/24 0218  WBC 7.1  --  6.1 6.6 5.6 5.6  HGB 7.0* 7.7* 7.4* 7.5* 7.3* 7.2*  HCT 21.4* 23.7* 22.5* 23.5* 22.8* 22.5*  MCV 97.3  --  96.2 97.5 98.7 98.3  PLT 247  --  192 196 193 205   Basic Metabolic Panel: Recent Labs  Lab 04/02/24 0355 04/04/24 0308 04/05/24 0253 04/06/24 0247 04/08/24 0218  NA 128* 125* 115* 125* 127*  K 3.5 4.7 5.6* 4.8 4.4  CL 90* 89* 80* 88* 88*  CO2 26 28 23 28 28   GLUCOSE 105* 85 100* 86 96  BUN 23 25* 36* 23 24*  CREATININE 2.25* 2.71* 3.33* 2.24* 2.45*  CALCIUM  8.2* 8.2* 8.4* 8.4* 8.4*   GFR: Estimated Creatinine Clearance: 22.9 mL/min (A) (by C-G formula based on SCr of 2.45 mg/dL (H)). Liver Function Tests: No results for input(s): AST, ALT, ALKPHOS, BILITOT, PROT, ALBUMIN  in the last 168 hours.  No results for input(s): LIPASE, AMYLASE in the last 168 hours. No results for input(s): AMMONIA in the last 168 hours. Coagulation Profile: No results for input(s): INR, PROTIME in the last 168 hours. Cardiac Enzymes: No results for input(s): CKTOTAL, CKMB, CKMBINDEX, TROPONINI in the last 168 hours. BNP (last 3 results) Recent Labs    03/10/24 0055 03/13/24 2100 03/23/24 1850  PROBNP  >35,000.0* >35,000.0* >35,000.0*   HbA1C: No results for input(s): HGBA1C in the last 72 hours. CBG: No results for input(s): GLUCAP in the last 168 hours. Lipid Profile: No results for input(s): CHOL, HDL, LDLCALC, TRIG, CHOLHDL, LDLDIRECT in the last 72 hours. Thyroid Function Tests: No results for input(s): TSH, T4TOTAL, FREET4, T3FREE, THYROIDAB in the last 72 hours. Anemia Panel: No results for input(s): VITAMINB12, FOLATE, FERRITIN, TIBC, IRON , RETICCTPCT in the last 72 hours.  Urine analysis:    Component Value Date/Time   COLORURINE YELLOW 12/30/2023 1305   APPEARANCEUR CLOUDY (A) 12/30/2023 1305   LABSPEC 1.011 12/30/2023 1305   PHURINE 6.0 12/30/2023 1305   GLUCOSEU 50 (A) 12/30/2023 1305   HGBUR SMALL (A) 12/30/2023 1305   BILIRUBINUR NEGATIVE 12/30/2023 1305   KETONESUR NEGATIVE 12/30/2023 1305   PROTEINUR 100 (A) 12/30/2023 1305   UROBILINOGEN 0.2 03/02/2010 2027   NITRITE NEGATIVE 12/30/2023 1305   LEUKOCYTESUR LARGE (A) 12/30/2023 1305   Sepsis Labs: @LABRCNTIP (procalcitonin:4,lacticidven:4)  ) No results found for this or any previous visit (from the past 240 hours).    Radiology Studies: No results found.     Scheduled Meds:  sodium chloride    Intravenous Once   amLODipine   10 mg Oral Daily   atorvastatin   10 mg Oral Daily   budesonide -glycopyrrolate -formoterol   2 puff Inhalation BID   busPIRone   5 mg Oral BID   calcitRIOL   0.25 mcg Oral Daily   carvedilol   6.25 mg Oral BID WC   Chlorhexidine  Gluconate Cloth  6 each Topical Q0600   Chlorhexidine  Gluconate Cloth  6 each Topical Q0600   darbepoetin (ARANESP ) injection - DIALYSIS  150 mcg Subcutaneous Q Wed-1800   folic acid   1 mg Oral Daily   gabapentin   100 mg Oral QHS   multivitamin with minerals  1 tablet Oral Daily   nicotine   14 mg Transdermal Daily   pantoprazole   40 mg Oral BID   thiamine   100 mg Oral Daily   torsemide   100 mg Oral Daily     LOS:  15 days  Remain inpatient because : acute hypoxic respiratory failure complicated by volume overload in the setting of ESRD on hemodialysis   Time spent:    Ndofunsu Martie Fulgham, MD 04/08/2024 1:16 PM  For on call review [email or web address].  "

## 2024-04-08 NOTE — Plan of Care (Signed)
 Patient seems unaware that dialysis is vital and concerned that the dialysis isn't good for her. Wanted to leave AMA. Consulted doctor, convinced her to stay, as her ex-husband may be offering her refuge also thinks she should do dialysis. Clinics apparently are hesitant to schedule her because she would be a no-show because lack of transportation. When she moved she said medicare would no longer give her a ride.   Doctor wants plan for dialysis before discharge. This is her barrier to discharge you can share with her.  Working to keep her here, but she intermittently wants to leave AMA, gets upset, and also tried to walk herself out (leave AMA) says nurse station. They turned her around when she wanted to walk toward elevators.

## 2024-04-08 NOTE — Progress Notes (Signed)
 Pt's case discussed with nephrologist. At this time, plan is for pt to return to ED for assessment of HD needs when needed at d/c. Pt currently does not have an out-pt HD clinic and pt has not been agreeable or compliant with out-pt HD due to social barriers. Will assist as needed.   Randine Mungo Dialysis Navigator [phone]

## 2024-04-09 ENCOUNTER — Inpatient Hospital Stay (HOSPITAL_COMMUNITY)

## 2024-04-09 LAB — BASIC METABOLIC PANEL WITH GFR
Anion gap: 9 (ref 5–15)
BUN: 34 mg/dL — ABNORMAL HIGH (ref 8–23)
CO2: 28 mmol/L (ref 22–32)
Calcium: 8.7 mg/dL — ABNORMAL LOW (ref 8.9–10.3)
Chloride: 87 mmol/L — ABNORMAL LOW (ref 98–111)
Creatinine, Ser: 3.55 mg/dL — ABNORMAL HIGH (ref 0.44–1.00)
GFR, Estimated: 14 mL/min — ABNORMAL LOW
Glucose, Bld: 92 mg/dL (ref 70–99)
Potassium: 5.3 mmol/L — ABNORMAL HIGH (ref 3.5–5.1)
Sodium: 124 mmol/L — ABNORMAL LOW (ref 135–145)

## 2024-04-09 LAB — GLUCOSE, CAPILLARY: Glucose-Capillary: 98 mg/dL (ref 70–99)

## 2024-04-09 LAB — TROPONIN T, HIGH SENSITIVITY
Troponin T High Sensitivity: 45 ng/L — ABNORMAL HIGH (ref 0–19)
Troponin T High Sensitivity: 47 ng/L — ABNORMAL HIGH (ref 0–19)

## 2024-04-09 MED ORDER — HEPARIN SODIUM (PORCINE) 1000 UNIT/ML DIALYSIS: 1000.0000 [IU] | INTRAMUSCULAR | Status: DC | PRN

## 2024-04-09 MED ORDER — LIDOCAINE-PRILOCAINE 2.5-2.5 % EX CREA: 1.0000 | TOPICAL_CREAM | CUTANEOUS | Status: DC | PRN

## 2024-04-09 MED ORDER — LIDOCAINE HCL (PF) 1 % IJ SOLN: 5.0000 mL | INTRAMUSCULAR | Status: DC | PRN

## 2024-04-09 MED ORDER — ALTEPLASE 2 MG IJ SOLR: 2.0000 mg | Freq: Once | INTRAMUSCULAR | Status: DC | PRN

## 2024-04-09 MED ORDER — PENTAFLUOROPROP-TETRAFLUOROETH EX AERO: 1.0000 | INHALATION_SPRAY | CUTANEOUS | Status: DC | PRN

## 2024-04-09 MED ORDER — ANTICOAGULANT SODIUM CITRATE 4% (200MG/5ML) IV SOLN: 5.0000 mL | Status: DC | PRN

## 2024-04-09 MED ORDER — ONDANSETRON HCL 4 MG/2ML IJ SOLN
4.0000 mg | Freq: Four times a day (QID) | INTRAMUSCULAR | Status: DC | PRN
  Administered 2024-04-09 – 2024-04-14 (×3): 4 mg via INTRAVENOUS
  Filled 2024-04-09 (×3): qty 2

## 2024-04-09 NOTE — Progress Notes (Addendum)
 Patient refused HD at this time as she states that she needs to eat breakfast first. Patient sitting in chair with Posey alarm engaged.   1000 Patient finished breakfast and moved from chair to bed. Bed alarm is on and mats  placed on the floor.  Talked with HD. Informed them that patient has eaten and is now willing to do HD when a spot opens up.  Called the Chaplain as patient had received a pair of reading glasses from them. She is asking for a replacement as the lens has been lost.

## 2024-04-09 NOTE — Progress Notes (Signed)
 CCMD notified RN of non-sustained bradycardia, HR 37. RN went to bedside and assessed patient. Upon assessment patient reported chest pain. Provider notified. Vital signs obtained and EKG performed.     04/09/24 0413  Vitals  Temp 97.6 F (36.4 C)  Temp Source Oral  BP (!) 121/59  MAP (mmHg) 70  BP Location Left Arm  BP Method Automatic  Patient Position (if appropriate) Lying  Pulse Rate 65  Pulse Rate Source Monitor  ECG Heart Rate 64  Resp 13  Level of Consciousness  Level of Consciousness Alert  MEWS COLOR  MEWS Score Color Green  Oxygen  Therapy  SpO2 94 %  O2 Device Nasal Cannula

## 2024-04-09 NOTE — Progress Notes (Signed)
 " [facility] Progress Note   Subjective:    Seen in room   Objective Vitals:   04/09/24 0108 04/09/24 0413 04/09/24 0819 04/09/24 1200  BP:  (!) 121/59  137/75  Pulse:  65 68 64  Resp:  13 18   Temp:  97.6 F (36.4 C)  97.9 F (36.6 C)  TempSrc:  Oral  Oral  SpO2:  94%  94%  Weight: 61.8 kg     Height:       Physical Exam General: chronically ill appearing, 4 L [place] O2 Heart: RRR Lungs: coarse breath sounds, normal wob Abdomen: distended 1-2+  Extremities: 1+ bilat LE edema Dialysis Access: RU AVF +b    Dialysis Orders: from 2024 -->  3h  B350 Heparin  none  RUA AVF No dialysis center currently    Assessment/Plan  ESRD - does not have a regular HD center, when dc'd/ or if leaves AMA, patient knows to come to the ED for hospital HD  - has been very noncompliant with outpt HD, therefore no HD unit - as inpatient we are on MWF schedule - next HD today  Volume overload, AHRF - CXR 1/14 +pulm edema  - came in 70-73kg, is now down to 57- 63kg - would like to keep under 60kg if possible - on 4 L [place] O2 - still has excess vol, very difficult to keep her volume down - next HD today  Agitation/ AMS - required CIWA protocol last week - stable now   Liver cirrhosis, Ascites - s/p paracentesis 0.67 L on 1/9 - s/p paracentesis 1.5 L on 1/16  Chronic hyponatremia - will continue to attempt to manage with HD/UF - Na+ 115 yest, up to 125 today after HD - changed back to renal diet w/ 1200 cc FR    Chronic homelessness  Anemia of CKD - Hgb 7.0, Venofer  x 2 ordered, ESA started 1/14 and dose increased - Iron  replete - Hb 7-8 range here   HTN - bp's are normal on coreg  and norvasc   Nutrition - changed back to renal diet  - fluid restriction .  COPD - continue home meds  GOC  - pt is DNR-Limited - poor prognosis, pt is aware - met w/ palliative -> wants to continue HD for now     Myer Fret  MD  [facility] 04/09/2024, 3:58  PM  Recent Labs  Lab 04/06/24 0247 04/08/24 0218 04/09/24 0258  HGB 7.3* 7.2*  --   CALCIUM  8.4* 8.4* 8.7*  CREATININE 2.24* 2.45* 3.55*  K 4.8 4.4 5.3*    Inpatient medications:  sodium chloride    Intravenous Once   amLODipine   10 mg Oral Daily   atorvastatin   10 mg Oral Daily   budesonide -glycopyrrolate -formoterol   2 puff Inhalation BID   busPIRone   5 mg Oral BID   calcitRIOL   0.25 mcg Oral Daily   carvedilol   6.25 mg Oral BID WC   Chlorhexidine  Gluconate Cloth  6 each Topical Q0600   Chlorhexidine  Gluconate Cloth  6 each Topical Q0600   Chlorhexidine  Gluconate Cloth  6 each Topical Q0600   darbepoetin (ARANESP ) injection - DIALYSIS  150 mcg Subcutaneous Q Wed-1800   folic acid   1 mg Oral Daily   gabapentin   100 mg Oral QHS   multivitamin with minerals  1 tablet Oral Daily   nicotine   14 mg Transdermal Daily   pantoprazole   40 mg Oral BID   thiamine   100 mg Oral Daily   torsemide   100 mg Oral Daily    anticoagulant sodium citrate       acetaminophen  **OR** acetaminophen , ALPRAZolam , alteplase , alum & mag hydroxide-simeth, anticoagulant sodium citrate , heparin , ipratropium-albuterol , lidocaine  (PF), lidocaine -prilocaine , melatonin, mouth rinse, oxyCODONE , pentafluoroprop-tetrafluoroeth         "

## 2024-04-09 NOTE — Plan of Care (Signed)

## 2024-04-09 NOTE — Progress Notes (Signed)
 This chaplain is present for F/U spiritual care in the setting of unit page. The Pt. is comfortable in bed and anticipating HD at the time of the visit.    The Pt. accepted reading glasses and a large print devotional text. The chaplain understands the gifts will help the Pt. get through the day.  This chaplain is available for F/U spiritual care as needed.  Chaplain Leeroy Hummer [phone]

## 2024-04-09 NOTE — Progress Notes (Signed)
 [facility] night cross cover note:   Added as needed IV Zofran  for nausea/vomiting.   Update: After the episode of nausea/vomiting earlier this evening, the patient conveys her concern that she may have aspirated on some of the vomitus, noting that she now has a mild cough, with RN conveying development of crackles in the bilateral bases.   Vital signs at this time notable for the following: Afebrile; heart rates in the 60s to 70s; systolic blood pressures in the 120s; respiratory rate 17-20, oxygen  saturation 93 to 94% on her baseline 3 L continuous nasal cannula, which appears to be similar to her oxygen  saturations on this degree of supplemental oxygen  throughout the day.  I subsequently placed order for stat chest x-ray to further evaluate the above, and have also added-on a procalcitonin level.      Eva Pore, DO Hospitalist

## 2024-04-09 NOTE — Progress Notes (Signed)
 " PROGRESS NOTE    [name]  FMW:[id number] DOB: 18-Mar-1961 DOA: 03/23/2024 PCP: Pcp, No   64 y/o female with hx of  COPD supposed to be on home oxygen , ESRD on HD, noncompliance due to homelessness, polysubstance abuse, diastolic CHF, hypertension, dyslipidemia, anxiety, cirrhosis, ascites who presented to   ED with chest pain and shortness of breath.  Admitted, started on hemodialysis, volume status improving -On 1/13 -14 noted to have worsening agitation and confusion, ABG with severe respiratory acidosis, placed on BiPAP, chest x-ray with worsening pulmonary edema She has been refusing  HD Rx even in the hospital and cutting HD short On  1/16, respiratory status improving, increased abdominal distention, paracentesis-1.5 L of clear blood-tinged peritoneal fluid drained -1/17 sustained a fall - 1/17, complaining of increased abdominal pain despite paracentesis On  CT 1/18: Right lateral abdominal wall hematoma  Subjective: Seen today > Resting in bed . Stated to feel fine. Denies chest pain, no headache, no dizziness, no abdominal pain.  Assessment and Plan:  Acute on chronic hypoxic and hypercarbic respiratory failure ESRD on HD, volume overload, pulmonary edema Metabolic encephalopathy Noncompliance with HD Volume overload due to  pulmonary edema and pleural effusions on repeat x-ray again yesterday morning. She had HD and paracentesis.She is improving.She does not complaint of sob. Still has oxygen  supplement with her but she stated that she does not need it.  S/P paracentesis 1/16 Noted to be noncompliance to HD  Schedule for HD today   Liver cirrhosis History of EtOH abuse, ascites Abdominal pain S/p paracentesis 1/9, 675 mL fluid drained, And paracentesis 1/16, only 1.5 L drained, Repeat  CT >small ascites, cirrhosis, right lateral abdominal wall hematoma Heparin  discontinued   Right lateral abdominal wall hematoma  Due to fall  Off  heparin  SQ-day 3 Continue   supportive care  Acute on chronic diastolic CHF  Volume managed with HD and paracentesis  Transient chest pain Resolved Troponin minimally elevated, no ACS Echo with preserved EF, global hypokinesis asymptomatic  Acute on chronic anemia Hemoglobin around 7.2 No active bleeding S/p transfusion 1 unit of packed RBC   Severe COPD/chronic respiratory failure Continue current inhalers and supplemental O2 as needed She told me that she does not use oxygen  all the night. Only as needed   Hypertension Continue amlodipine  and Coreg   Anxiety/depression Continue BuSpar   Chronic pain/neuropathy Continue gabapentin  Hyponatremia Due to volume overload. Expect to improve with continue HD fluid removal   Vaginal Ulcers patient stated that she has vaginal ulcers and requested to be examined by Gyn  Gynecology consult pending     DVT prophylaxis: Heparin  subcutaneous on hold Code Status: DNR Family Communication: No immediate family contacts, unable to reach daughter yesterday, will reattempt today Disposition Plan: Unknown at the moment     Objective: Vitals:   04/09/24 0108 04/09/24 0413 04/09/24 0819 04/09/24 1200  BP:  (!) 121/59  137/75  Pulse:  65 68 64  Resp:  13 18   Temp:  97.6 F (36.4 C)  97.9 F (36.6 C)  TempSrc:  Oral  Oral  SpO2:  94%  94%  Weight: 61.8 kg     Height:        Intake/Output Summary (Last 24 hours) at 04/09/2024 1519 Last data filed at 04/08/2024 1904 Gross per 24 hour  Intake 240 ml  Output --  Net 240 ml   Filed Weights   04/07/24 1826 04/08/24 0159 04/09/24 0108  Weight: 61.7 kg 61.6 kg 61.8 kg  Examination:  Chronically ill. Awake and alert. Not is distress CVS: S1-S2, regular rhythm No JVD  Resp: Decreased breath sounds to bases, few basilar rales No wheezes,  no crackles  Abdomen exam  No tenderness, +bowel sound  Extremities: 1+ edema Neuro: No focal neurology deficit  Data Reviewed:   CBC: Recent Labs  Lab  04/03/24 0248 04/03/24 2130 04/04/24 0308 04/05/24 0253 04/06/24 0247 04/08/24 0218  WBC 7.1  --  6.1 6.6 5.6 5.6  HGB 7.0* 7.7* 7.4* 7.5* 7.3* 7.2*  HCT 21.4* 23.7* 22.5* 23.5* 22.8* 22.5*  MCV 97.3  --  96.2 97.5 98.7 98.3  PLT 247  --  192 196 193 205   Basic Metabolic Panel: Recent Labs  Lab 04/04/24 0308 04/05/24 0253 04/06/24 0247 04/08/24 0218 04/09/24 0258  NA 125* 115* 125* 127* 124*  K 4.7 5.6* 4.8 4.4 5.3*  CL 89* 80* 88* 88* 87*  CO2 28 23 28 28 28   GLUCOSE 85 100* 86 96 92  BUN 25* 36* 23 24* 34*  CREATININE 2.71* 3.33* 2.24* 2.45* 3.55*  CALCIUM  8.2* 8.4* 8.4* 8.4* 8.7*   GFR: Estimated Creatinine Clearance: 15.8 mL/min (A) (by C-G formula based on SCr of 3.55 mg/dL (H)). Liver Function Tests: No results for input(s): AST, ALT, ALKPHOS, BILITOT, PROT, ALBUMIN  in the last 168 hours.  No results for input(s): LIPASE, AMYLASE in the last 168 hours. No results for input(s): AMMONIA in the last 168 hours. Coagulation Profile: No results for input(s): INR, PROTIME in the last 168 hours. Cardiac Enzymes: No results for input(s): CKTOTAL, CKMB, CKMBINDEX, TROPONINI in the last 168 hours. BNP (last 3 results) Recent Labs    03/10/24 0055 03/13/24 2100 03/23/24 1850  PROBNP >35,000.0* >35,000.0* >35,000.0*   HbA1C: No results for input(s): HGBA1C in the last 72 hours. CBG: No results for input(s): GLUCAP in the last 168 hours. Lipid Profile: No results for input(s): CHOL, HDL, LDLCALC, TRIG, CHOLHDL, LDLDIRECT in the last 72 hours. Thyroid Function Tests: No results for input(s): TSH, T4TOTAL, FREET4, T3FREE, THYROIDAB in the last 72 hours. Anemia Panel: No results for input(s): VITAMINB12, FOLATE, FERRITIN, TIBC, IRON , RETICCTPCT in the last 72 hours.  Urine analysis:    Component Value Date/Time   COLORURINE YELLOW 12/30/2023 1305   APPEARANCEUR CLOUDY (A) 12/30/2023 1305    LABSPEC 1.011 12/30/2023 1305   PHURINE 6.0 12/30/2023 1305   GLUCOSEU 50 (A) 12/30/2023 1305   HGBUR SMALL (A) 12/30/2023 1305   BILIRUBINUR NEGATIVE 12/30/2023 1305   KETONESUR NEGATIVE 12/30/2023 1305   PROTEINUR 100 (A) 12/30/2023 1305   UROBILINOGEN 0.2 03/02/2010 2027   NITRITE NEGATIVE 12/30/2023 1305   LEUKOCYTESUR LARGE (A) 12/30/2023 1305   Sepsis Labs: @LABRCNTIP (procalcitonin:4,lacticidven:4)  ) No results found for this or any previous visit (from the past 240 hours).    Radiology Studies: DG CHEST PORT 1 VIEW Result Date: 04/09/2024 CLINICAL DATA:  Shortness of breath and chest pain. EXAM: PORTABLE CHEST 1 VIEW COMPARISON:  03/31/2024 FINDINGS: The cardio pericardial silhouette is enlarged. Diffuse interstitial pulmonary edema pattern. Similar basilar atelectasis or infiltrate with small bilateral pleural effusions. Telemetry leads overlie the chest. IMPRESSION: No substantial change. Interstitial pulmonary edema pattern with bibasilar atelectasis or infiltrate and small bilateral pleural effusions. Electronically Signed   By: Camellia Candle M.D.   On: 04/09/2024 06:27       Scheduled Meds:  sodium chloride    Intravenous Once   amLODipine   10 mg Oral Daily   atorvastatin   10 mg  Oral Daily   budesonide -glycopyrrolate -formoterol   2 puff Inhalation BID   busPIRone   5 mg Oral BID   calcitRIOL   0.25 mcg Oral Daily   carvedilol   6.25 mg Oral BID WC   Chlorhexidine  Gluconate Cloth  6 each Topical Q0600   Chlorhexidine  Gluconate Cloth  6 each Topical Q0600   Chlorhexidine  Gluconate Cloth  6 each Topical Q0600   darbepoetin (ARANESP ) injection - DIALYSIS  150 mcg Subcutaneous Q Wed-1800   folic acid   1 mg Oral Daily   gabapentin   100 mg Oral QHS   multivitamin with minerals  1 tablet Oral Daily   nicotine   14 mg Transdermal Daily   pantoprazole   40 mg Oral BID   thiamine   100 mg Oral Daily   torsemide   100 mg Oral Daily    anticoagulant sodium citrate       LOS: 16  days  Remain inpatient because : acute hypoxic respiratory failure complicated by volume overload in the setting of ESRD on hemodialysis   Time spent:   Jefferson Marinas, MD 04/09/2024 3:24 PM  For on call review [email or web address]  "

## 2024-04-09 NOTE — Progress Notes (Incomplete Revision)
 [facility] night cross cover note:   Added as needed IV Zofran  for nausea/vomiting.   Update:  she feels like she may have aspirated on her vomit. lung sounds in the bases have crackles   STAT CXR ordered ***  92-93 on her chronic 3L   Eva Pore, DO Hospitalist

## 2024-04-10 ENCOUNTER — Inpatient Hospital Stay (HOSPITAL_COMMUNITY)

## 2024-04-10 LAB — BASIC METABOLIC PANEL WITH GFR
Anion gap: 10 (ref 5–15)
Anion gap: 12 (ref 5–15)
BUN: 45 mg/dL — ABNORMAL HIGH (ref 8–23)
BUN: 21 mg/dL (ref 8–23)
CO2: 26 mmol/L (ref 22–32)
CO2: 24 mmol/L (ref 22–32)
Calcium: 8.4 mg/dL — ABNORMAL LOW (ref 8.9–10.3)
Calcium: 8.4 mg/dL — ABNORMAL LOW (ref 8.9–10.3)
Chloride: 85 mmol/L — ABNORMAL LOW (ref 98–111)
Chloride: 90 mmol/L — ABNORMAL LOW (ref 98–111)
Creatinine, Ser: 3.89 mg/dL — ABNORMAL HIGH (ref 0.44–1.00)
Creatinine, Ser: 2.28 mg/dL — ABNORMAL HIGH (ref 0.44–1.00)
GFR, Estimated: 12 mL/min — ABNORMAL LOW
GFR, Estimated: 23 mL/min — ABNORMAL LOW
Glucose, Bld: 113 mg/dL — ABNORMAL HIGH (ref 70–99)
Glucose, Bld: 107 mg/dL — ABNORMAL HIGH (ref 70–99)
Potassium: 5.7 mmol/L — ABNORMAL HIGH (ref 3.5–5.1)
Potassium: 4.4 mmol/L (ref 3.5–5.1)
Sodium: 121 mmol/L — ABNORMAL LOW (ref 135–145)
Sodium: 126 mmol/L — ABNORMAL LOW (ref 135–145)

## 2024-04-10 LAB — CBC
HCT: 22 % — ABNORMAL LOW (ref 36.0–46.0)
Hemoglobin: 6.9 g/dL — CL (ref 12.0–15.0)
MCH: 31.8 pg (ref 26.0–34.0)
MCHC: 31.4 g/dL (ref 30.0–36.0)
MCV: 101.4 fL — ABNORMAL HIGH (ref 80.0–100.0)
Platelets: 243 10*3/uL (ref 150–400)
RBC: 2.17 MIL/uL — ABNORMAL LOW (ref 3.87–5.11)
RDW: 17.9 % — ABNORMAL HIGH (ref 11.5–15.5)
WBC: 9.2 10*3/uL (ref 4.0–10.5)
nRBC: 0 % (ref 0.0–0.2)

## 2024-04-10 LAB — PREPARE RBC (CROSSMATCH)

## 2024-04-10 LAB — GLUCOSE, CAPILLARY
Glucose-Capillary: 104 mg/dL — ABNORMAL HIGH (ref 70–99)
Glucose-Capillary: 120 mg/dL — ABNORMAL HIGH (ref 70–99)
Glucose-Capillary: 115 mg/dL — ABNORMAL HIGH (ref 70–99)

## 2024-04-10 LAB — PROCALCITONIN: Procalcitonin: 0.22 ng/mL

## 2024-04-10 MED ORDER — AMLODIPINE BESYLATE 5 MG PO TABS
2.5000 mg | ORAL_TABLET | Freq: Two times a day (BID) | ORAL | Status: DC
  Administered 2024-04-11 – 2024-04-15 (×8): 2.5 mg via ORAL
  Filled 2024-04-10 (×10): qty 1

## 2024-04-10 MED ORDER — HEPARIN SODIUM (PORCINE) 1000 UNIT/ML IJ SOLN
INTRAMUSCULAR | Status: AC
  Filled 2024-04-10: qty 3

## 2024-04-10 MED ORDER — OXYCODONE HCL 5 MG PO TABS
ORAL_TABLET | ORAL | Status: AC
  Filled 2024-04-10: qty 2

## 2024-04-10 MED ORDER — SODIUM CHLORIDE 0.9% IV SOLUTION: Freq: Once | INTRAVENOUS | Status: DC

## 2024-04-10 MED ORDER — PENTAFLUOROPROP-TETRAFLUOROETH EX AERO
INHALATION_SPRAY | CUTANEOUS | Status: AC
  Filled 2024-04-10: qty 30

## 2024-04-10 NOTE — Progress Notes (Signed)
 [facility] night cross cover note:   I was notified by the patient's RN of the patient's request to advance her diet order, which is currently ordered as n.p.o. with exceptions that include sips and chips.  Per previous chart review, it appears that the patient has been n.p.o. since 04/09/2024.  Per my chart review, it does not appear that she is scheduled undergo any procedure over the next day, nor my seeing any overt pathology precluding advancement in diet at this time.  No small bowel obstruction, no report of any acute pancreatitis.  Subsequently, and per patient's request, I have placed order to advance her diet to a renal diet, noting that she has end-stage renal disease on hemodialysis.    Eva Pore, DO Hospitalist

## 2024-04-10 NOTE — Plan of Care (Signed)
" °  Problem: Education: Goal: Knowledge of General Education information will improve Description: Including pain rating scale, medication(s)/side effects and non-pharmacologic comfort measures Outcome: Progressing   Problem: Health Behavior/Discharge Planning: Goal: Ability to manage health-related needs will improve Outcome: Progressing   Problem: Clinical Measurements: Goal: Ability to maintain clinical measurements within normal limits will improve Outcome: Progressing   Problem: Clinical Measurements: Goal: Will remain free from infection Outcome: Progressing   Problem: Clinical Measurements: Goal: Diagnostic test results will improve Outcome: Progressing   Problem: Clinical Measurements: Goal: Respiratory complications will improve Outcome: Progressing   Problem: Clinical Measurements: Goal: Cardiovascular complication will be avoided Outcome: Progressing   Problem: Clinical Measurements: Goal: Cardiovascular complication will be avoided Outcome: Progressing   Problem: Activity: Goal: Risk for activity intolerance will decrease Outcome: Progressing   Problem: Elimination: Goal: Will not experience complications related to bowel motility Outcome: Progressing   Problem: Elimination: Goal: Will not experience complications related to urinary retention Outcome: Progressing   Problem: Pain Managment: Goal: General experience of comfort will improve and/or be controlled Outcome: Progressing   "

## 2024-04-10 NOTE — Progress Notes (Addendum)
 " PROGRESS NOTE    [name]  FMW:[id number] DOB: May 29, 1960 DOA: 03/23/2024 PCP: Pcp, No   64 y/o female with hx of  COPD supposed to be on home oxygen , ESRD on HD, noncompliance due to homelessness, polysubstance abuse, diastolic CHF, hypertension, dyslipidemia, anxiety, cirrhosis, ascites who presented to   ED with chest pain and shortness of breath.  Admitted, started on hemodialysis, volume status improving -On 1/13 -14 noted to have worsening agitation and confusion, ABG with severe respiratory acidosis, placed on BiPAP, chest x-ray with worsening pulmonary edema She has been refusing  HD Rx even in the hospital and cutting HD short On  1/16, respiratory status improving, increased abdominal distention, paracentesis-1.5 L of clear blood-tinged peritoneal fluid drained -1/17 sustained a fall - 1/17, complaining of increased abdominal pain despite paracentesis On  CT 1/18: Right lateral abdominal wall hematoma Patient is still volume overload and continue to require HD   Subjective: Seen early this morning before dialysis. She was very confused. I did see her again after dialysis she seem to be more awake. She told that she is back from dialysis. Denies sob, no chest pain, no headache, no abdominal pain. Nurse told that she has been vomiting and Zofran  is helping.  Assessment and Plan:  Acute on chronic hypoxic and hypercarbic respiratory failure ESRD on HD, volume overload, pulmonary edema Metabolic encephalopathy Noncompliance with HD Volume overload due to  pulmonary edema and pleural effusions on repeat x-ray again yesterday morning. She had HD and paracentesis.She is improving.She does not complaint of sob. Still has oxygen  supplement with her but she stated that she does not need it.  S/P paracentesis 1/16 Noted to be noncompliance to HD  Still volume overload  Schedule for HD today   Liver cirrhosis History of EtOH abuse, ascites Abdominal pain S/p paracentesis 1/9, 675  mL fluid drained, And paracentesis 1/16, only 1.5 L drained, Repeat  CT >small ascites, cirrhosis, right lateral abdominal wall hematoma Heparin  discontinued . He abdomen is distended. Since she  continue to feel nauseated . Will KUB if abdominal x-ray is normal. Will schedule for repeat ultrasound guided paracentesis. Suspected she has ascites again   Right lateral abdominal wall hematoma  Due to fall  Off  heparin  SQ-day 3 Continue  supportive care  Acute on chronic diastolic CHF  Volume managed with HD and paracentesis  Transient chest pain Resolved Troponin minimally elevated, no ACS Echo with preserved EF, global hypokinesis asymptomatic  Acute on chronic anemia Hemoglobin around 6.4 No active bleeding S/p transfusion 2 unit of packed RBC during dialysis today   Severe COPD/chronic respiratory failure Continue current inhalers and supplemental O2 as needed She told me that she does not use oxygen  all the night. Only as needed   Hypertension Continue amlodipine  and Coreg   Anxiety/depression Continue BuSpar  She is also of low dose xanax  0.25 mg twice daily prn   Chronic pain/neuropathy Continue gabapentin  Hyponatremia Due to volume overload. Expect to improve with continue HD fluid removal  She seem to have chronic hyponatremia   Vaginal Ulcers patient stated that she has vaginal ulcers and requested to be examined by [facility]  Gynecology consult pending     DVT prophylaxis: Heparin  subcutaneous on hold Code Status: DNR Family Communication: No immediate family contacts, unable to reach daughter yesterday, will reattempt today Disposition Plan: Unknown at the moment     Objective: Vitals:   04/10/24 1320 04/10/24 1336 04/10/24 1340 04/10/24 1432  BP: (!) 132/59 (!) 125/57 ROLLEN)  124/54 (!) 115/58  Pulse: 76 73 68 75  Resp: 19 17 12 18   Temp: (!) 97.3 F (36.3 C) (!) 97.3 F (36.3 C)  98.5 F (36.9 C)  TempSrc: Axillary Axillary  Oral  SpO2: 95% 99% 98%  100%  Weight:      Height:        Intake/Output Summary (Last 24 hours) at 04/10/2024 1610 Last data filed at 04/10/2024 1336 Gross per 24 hour  Intake 575.17 ml  Output 4600 ml  Net -4024.83 ml   Filed Weights   04/08/24 0159 04/09/24 0108 04/10/24 0844  Weight: 61.6 kg 61.8 kg 66.3 kg    Examination:  Chronically ill. Awake and alert. Not is distress CVS: S1-S2, regular rhythm No JVD  Resp: Decreased breath sounds to bases, few basilar rales No wheezes,  no crackles  Abdomen exam  No tenderness, +bowel sound  Extremities: 1+ edema Neuro: No focal neurology deficit  Data Reviewed:   CBC: Recent Labs  Lab 04/04/24 0308 04/05/24 0253 04/06/24 0247 04/08/24 0218 04/10/24 0856  WBC 6.1 6.6 5.6 5.6 9.2  HGB 7.4* 7.5* 7.3* 7.2* 6.9*  HCT 22.5* 23.5* 22.8* 22.5* 22.0*  MCV 96.2 97.5 98.7 98.3 101.4*  PLT 192 196 193 205 243   Basic Metabolic Panel: Recent Labs  Lab 04/05/24 0253 04/06/24 0247 04/08/24 0218 04/09/24 0258 04/10/24 0856  NA 115* 125* 127* 124* 121*  K 5.6* 4.8 4.4 5.3* 5.7*  CL 80* 88* 88* 87* 85*  CO2 23 28 28 28 26   GLUCOSE 100* 86 96 92 113*  BUN 36* 23 24* 34* 45*  CREATININE 3.33* 2.24* 2.45* 3.55* 3.89*  CALCIUM  8.4* 8.4* 8.4* 8.7* 8.4*   GFR: Estimated Creatinine Clearance: 14.4 mL/min (A) (by C-G formula based on SCr of 3.89 mg/dL (H)). Liver Function Tests: No results for input(s): AST, ALT, ALKPHOS, BILITOT, PROT, ALBUMIN  in the last 168 hours.  No results for input(s): LIPASE, AMYLASE in the last 168 hours. No results for input(s): AMMONIA in the last 168 hours. Coagulation Profile: No results for input(s): INR, PROTIME in the last 168 hours. Cardiac Enzymes: No results for input(s): CKTOTAL, CKMB, CKMBINDEX, TROPONINI in the last 168 hours. BNP (last 3 results) Recent Labs    03/10/24 0055 03/13/24 2100 03/23/24 1850  PROBNP >35,000.0* >35,000.0* >35,000.0*   HbA1C: No results for  input(s): HGBA1C in the last 72 hours. CBG: Recent Labs  Lab 04/09/24 1959 04/10/24 0517 04/10/24 0824 04/10/24 1419  GLUCAP 98 104* 120* 115*   Lipid Profile: No results for input(s): CHOL, HDL, LDLCALC, TRIG, CHOLHDL, LDLDIRECT in the last 72 hours. Thyroid Function Tests: No results for input(s): TSH, T4TOTAL, FREET4, T3FREE, THYROIDAB in the last 72 hours. Anemia Panel: No results for input(s): VITAMINB12, FOLATE, FERRITIN, TIBC, IRON , RETICCTPCT in the last 72 hours.  Urine analysis:    Component Value Date/Time   COLORURINE YELLOW 12/30/2023 1305   APPEARANCEUR CLOUDY (A) 12/30/2023 1305   LABSPEC 1.011 12/30/2023 1305   PHURINE 6.0 12/30/2023 1305   GLUCOSEU 50 (A) 12/30/2023 1305   HGBUR SMALL (A) 12/30/2023 1305   BILIRUBINUR NEGATIVE 12/30/2023 1305   KETONESUR NEGATIVE 12/30/2023 1305   PROTEINUR 100 (A) 12/30/2023 1305   UROBILINOGEN 0.2 03/02/2010 2027   NITRITE NEGATIVE 12/30/2023 1305   LEUKOCYTESUR LARGE (A) 12/30/2023 1305   Sepsis Labs: @LABRCNTIP (procalcitonin:4,lacticidven:4)  ) No results found for this or any previous visit (from the past 240 hours).    Radiology Studies: [facility] Chest Port 1  View Result Date: 04/09/2024 EXAM: 1 VIEW XRAY OF THE CHEST 04/09/2024 09:22:00 PM COMPARISON: Comparison with 04/09/2024. CLINICAL HISTORY: SOB (shortness of breath). FINDINGS: LUNGS AND PLEURA: Bilateral pleural effusions with basilar atelectasis or consolidation. Mild perihilar infiltrates may represent edema or pneumonia. Similar appearance to previous study. No pneumothorax. HEART AND MEDIASTINUM: Cardiac enlargement. Mediastinal contours appear intact. Calcification of the aorta. BONES AND SOFT TISSUES: No acute osseous abnormality. IMPRESSION: 1. Mild perihilar infiltrates, which may represent edema or pneumonia. 2. Bilateral pleural effusions with basilar atelectasis or consolidation, similar to previous study. 3. Cardiac  enlargement. Electronically signed by: Elsie Gravely MD 04/09/2024 09:29 PM EST RP Workstation: [id number]   DG CHEST PORT 1 VIEW Result Date: 04/09/2024 CLINICAL DATA:  Shortness of breath and chest pain. EXAM: PORTABLE CHEST 1 VIEW COMPARISON:  03/31/2024 FINDINGS: The cardio pericardial silhouette is enlarged. Diffuse interstitial pulmonary edema pattern. Similar basilar atelectasis or infiltrate with small bilateral pleural effusions. Telemetry leads overlie the chest. IMPRESSION: No substantial change. Interstitial pulmonary edema pattern with bibasilar atelectasis or infiltrate and small bilateral pleural effusions. Electronically Signed   By: Camellia Candle M.D.   On: 04/09/2024 06:27       Scheduled Meds:  sodium chloride    Intravenous Once   sodium chloride    Intravenous Once   amLODipine   2.5 mg Oral BID   atorvastatin   10 mg Oral Daily   budesonide -glycopyrrolate -formoterol   2 puff Inhalation BID   busPIRone   5 mg Oral BID   calcitRIOL   0.25 mcg Oral Daily   Chlorhexidine  Gluconate Cloth  6 each Topical Q0600   Chlorhexidine  Gluconate Cloth  6 each Topical Q0600   Chlorhexidine  Gluconate Cloth  6 each Topical Q0600   darbepoetin (ARANESP ) injection - DIALYSIS  150 mcg Subcutaneous Q Wed-1800   folic acid   1 mg Oral Daily   gabapentin   100 mg Oral QHS   multivitamin with minerals  1 tablet Oral Daily   nicotine   14 mg Transdermal Daily   pantoprazole   40 mg Oral BID   thiamine   100 mg Oral Daily   torsemide   100 mg Oral Daily      LOS: 17 days   Remain inpatient because : acute hypoxic respiratory failure complicated by volume overload in the setting of ESRD on hemodialysis     Time spent:  Jefferson Marinas, MD 04/10/2024 4:10 PM  For on call review [email or web address].  "

## 2024-04-10 NOTE — Progress Notes (Signed)
 " [facility] Progress Note   Subjective:    Seen in room Na+ down to 121, K+ up at 5.7    Objective Vitals:   04/10/24 0819 04/10/24 0844 04/10/24 0856 04/10/24 0930  BP: (!) 103/53 (!) 101/52 (!) 100/45 (!) 102/49  Pulse: 61  (!) 58 62  Resp: 18  14 15   Temp: 98.7 F (37.1 C) 98 F (36.7 C)    TempSrc:  Oral    SpO2: 91%  96% 97%  Weight:  66.3 kg    Height:       Physical Exam General: chronically ill appearing, 4 L [place] O2 Heart: RRR Lungs: coarse breath sounds, normal wob Abdomen: distended 1-2+  Extremities: 1+ bilat LE edema Dialysis Access: RU AVF +b    Dialysis Orders: from 2024 -->  3h  B350 Heparin  none  RUA AVF No dialysis center currently    Assessment/Plan  ESRD - does not have a regular HD center - when dc'd/ or leaves AMA, pt knows to come to ED for hospital HD  - has been completely noncompliant with outpt HD, thus has no OP unit - in hospital we are on MWF schedule - next HD today (rollover from yest)  Volume overload, AHRF - CXR 1/14 +pulm edema - resp issues better, however still sig vol overloaded  - came in 70-73kg, now is to 57- 63kg  # hyperkalemia - low K+ bath w/ HD today   Agitation/ AMS - required CIWA protocol last week - stable now   Liver cirrhosis, Ascites - s/p paracentesis 0.67 L on 1/9 - s/p paracentesis 1.5 L on 1/16  Chronic hyponatremia - due to over-drinking liquids - attempting to manage with HD - changed back to renal diet w/ 1200 cc FR - poor prognosis   Chronic homelessness  Anemia of CKD - Hgb 7.0, Venofer  x 2 ordered, ESA started 1/14 and dose increased - Iron  replete - Hb 6.9 today -> ordered prbc's x 2   HTN - bp's soft 105/ 70 - will dc coreg , and drop norvasc  to 2.5 bid w/ hold orders   Nutrition - changed back to renal diet  - fluid restriction .  COPD - continue home meds  GOC  - pt is DNR-Limited - very poor prognosis, cannot limit fluid  intake      Myer Fret  MD  [facility] 04/10/2024, 10:01 AM  Recent Labs  Lab 04/08/24 0218 04/09/24 0258 04/10/24 0856  HGB 7.2*  --  6.9*  CALCIUM  8.4* 8.7* 8.4*  CREATININE 2.45* 3.55* 3.89*  K 4.4 5.3* 5.7*    Inpatient medications:  sodium chloride    Intravenous Once   sodium chloride    Intravenous Once   amLODipine   10 mg Oral Daily   atorvastatin   10 mg Oral Daily   budesonide -glycopyrrolate -formoterol   2 puff Inhalation BID   busPIRone   5 mg Oral BID   calcitRIOL   0.25 mcg Oral Daily   carvedilol   6.25 mg Oral BID WC   Chlorhexidine  Gluconate Cloth  6 each Topical Q0600   Chlorhexidine  Gluconate Cloth  6 each Topical Q0600   Chlorhexidine  Gluconate Cloth  6 each Topical Q0600   darbepoetin (ARANESP ) injection - DIALYSIS  150 mcg Subcutaneous Q Wed-1800   folic acid   1 mg Oral Daily   gabapentin   100 mg Oral QHS   multivitamin with minerals  1 tablet Oral Daily   nicotine   14 mg Transdermal Daily   pantoprazole   40 mg Oral  BID   thiamine   100 mg Oral Daily   torsemide   100 mg Oral Daily    anticoagulant sodium citrate       acetaminophen  **OR** acetaminophen , ALPRAZolam , alteplase , alum & mag hydroxide-simeth, anticoagulant sodium citrate , heparin , ipratropium-albuterol , lidocaine  (PF), lidocaine -prilocaine , melatonin, ondansetron  (ZOFRAN ) IV, mouth rinse, oxyCODONE , pentafluoroprop-tetrafluoroeth         "

## 2024-04-10 NOTE — Progress Notes (Signed)
" °   04/10/24 1336  Vitals  Temp (!) 97.3 F (36.3 C)  Temp Source Axillary  BP (!) 125/57  BP Location Left Arm  BP Method Automatic  Patient Position (if appropriate) Lying  Pulse Rate 73  Pulse Rate Source Monitor  ECG Heart Rate 73  Resp 17  Oxygen  Therapy  SpO2 99 %  O2 Device Nasal Cannula  O2 Flow Rate (L/min) 2 L/min  Patient Activity (if Appropriate) In bed  Pulse Oximetry Type Continuous  Oximetry Probe Site Changed No  During Treatment Monitoring  Blood Flow Rate (mL/min) 300 mL/min  Arterial Pressure (mmHg) -178.17 mmHg  Venous Pressure (mmHg) 154.54 mmHg  TMP (mmHg) 42.22 mmHg  Ultrafiltration Rate (mL/min) 1951 mL/min  Dialysate Flow Rate (mL/min) 300 ml/min  Dialysate Potassium Concentration 2  Dialysate Calcium  Concentration 2.5  Duration of HD Treatment -hour(s) 3.45 hour(s)  Cumulative Fluid Removed (mL) per Treatment  5502.35  HD Safety Checks Performed Yes  Intra-Hemodialysis Comments Tx completed;Tolerated well  Post Treatment  Dialyzer Clearance Heavily streaked  Liters Processed 65  Fluid Removed (mL) 4600 mL  Post-Hemodialysis Comments recieved 2 units of prbc tolerated transfusion  AVG/AVF Arterial Site Held (minutes) 8 minutes  AVG/AVF Venous Site Held (minutes) 8 minutes  Fistula / Graft Right Upper arm Arteriovenous fistula  Placement Date/Time: 08/14/22 1230   Orientation: Right  Access Location: (c) Upper arm  Access Type: Arteriovenous fistula  Site Condition No complications  Fistula / Graft Assessment Present;Thrill;Bruit  Status Deaccessed  Drainage Description None    "

## 2024-04-11 LAB — CBC
HCT: 25.4 % — ABNORMAL LOW (ref 36.0–46.0)
Hemoglobin: 8.2 g/dL — ABNORMAL LOW (ref 12.0–15.0)
MCH: 31.1 pg (ref 26.0–34.0)
MCHC: 32.3 g/dL (ref 30.0–36.0)
MCV: 96.2 fL (ref 80.0–100.0)
Platelets: 221 10*3/uL (ref 150–400)
RBC: 2.64 MIL/uL — ABNORMAL LOW (ref 3.87–5.11)
RDW: 19.4 % — ABNORMAL HIGH (ref 11.5–15.5)
WBC: 8.3 10*3/uL (ref 4.0–10.5)
nRBC: 0.2 % (ref 0.0–0.2)

## 2024-04-11 LAB — BPAM RBC
Blood Product Expiration Date: 202602132359
Blood Product Expiration Date: 202602132359
ISSUE DATE / TIME: 202601241100
ISSUE DATE / TIME: 202601241100
Unit Type and Rh: 6200
Unit Type and Rh: 6200

## 2024-04-11 LAB — TYPE AND SCREEN
ABO/RH(D): A POS
Antibody Screen: NEGATIVE
Unit division: 0
Unit division: 0

## 2024-04-11 MED ORDER — DOCUSATE SODIUM 100 MG PO CAPS
100.0000 mg | ORAL_CAPSULE | Freq: Two times a day (BID) | ORAL | Status: DC
  Administered 2024-04-11 – 2024-04-15 (×8): 100 mg via ORAL
  Filled 2024-04-11 (×8): qty 1

## 2024-04-11 MED ORDER — POLYETHYLENE GLYCOL 3350 17 G PO PACK
17.0000 g | PACK | Freq: Every day | ORAL | Status: DC
  Administered 2024-04-11 – 2024-04-15 (×5): 17 g via ORAL
  Filled 2024-04-11 (×5): qty 1

## 2024-04-11 MED ORDER — CHLORHEXIDINE GLUCONATE CLOTH 2 % EX PADS
6.0000 | MEDICATED_PAD | Freq: Every day | CUTANEOUS | Status: DC
  Administered 2024-04-12 – 2024-04-14 (×3): 6 via TOPICAL

## 2024-04-11 MED ORDER — CYCLOBENZAPRINE HCL 5 MG PO TABS
2.5000 mg | ORAL_TABLET | Freq: Once | ORAL | Status: AC
  Administered 2024-04-11: 2.5 mg via ORAL
  Filled 2024-04-11: qty 1

## 2024-04-11 NOTE — Progress Notes (Addendum)
 " PROGRESS NOTE    [name]  FMW:[id number] DOB: Aug 20, 1960 DOA: 03/23/2024 PCP: Pcp, No   64 y/o female with hx of  COPD supposed to be on home oxygen , ESRD on HD, noncompliance due to homelessness, polysubstance abuse, diastolic CHF, hypertension, dyslipidemia, anxiety, cirrhosis, ascites who presented to   ED with chest pain and shortness of breath.  Admitted, started on hemodialysis, volume status improving -On 1/13 -14 noted to have worsening agitation and confusion, ABG with severe respiratory acidosis, placed on BiPAP, chest x-ray with worsening pulmonary edema She has been refusing  HD Rx even in the hospital and cutting HD short On  1/16, respiratory status improving, increased abdominal distention, paracentesis-1.5 L of clear blood-tinged peritoneal fluid drained -1/17 sustained a fall - 1/17, complaining of increased abdominal pain despite paracentesis On  CT 1/18: Right lateral abdominal wall hematoma Patient is still volume overload and continue to require HD   Subjective: Seen today > patient stated to be doing fine then yesterday. Nausea improved. No abdominal pain. No chest pain, no sob. She will like to go home. But I noted that she is homeless Assessment and Plan:  Acute on chronic hypoxic and hypercarbic respiratory failure ESRD on HD, volume overload, pulmonary edema Metabolic encephalopathy Noncompliance with HD Volume overload due to  pulmonary edema and pleural effusions on repeat x-ray again yesterday morning. She had HD and paracentesis.She is improving.She does not complaint of sob. Still has oxygen  supplement with her but she stated that she does not need it.  S/P paracentesis 1/16 Noted to be noncompliance to HD  Still volume overload  HD schedule for Monday   Liver cirrhosis History of EtOH abuse, ascites Abdominal pain S/p paracentesis 1/9, 675 mL fluid drained, And paracentesis 1/16, only 1.5 L drained, Repeat  CT >small ascites, cirrhosis, right  lateral abdominal wall hematoma Heparin  discontinued .   Right lateral abdominal wall hematoma  Due to fall  Off  heparin  SQ-day 3 Continue  supportive care  Acute on chronic diastolic CHF  Volume managed with HD and paracentesis  Transient chest pain Resolved Troponin minimally elevated, no ACS Echo with preserved EF, global hypokinesis asymptomatic  Acute on chronic anemia Hemoglobin around 8.2. Post transfusion and stable  No active bleeding S/p transfusion 2 unit of packed RBC during dialysis today   Severe COPD/chronic respiratory failure Continue current inhalers and supplemental O2 as needed She told me that she does not use oxygen  all the night. Only as needed   Hypertension Continue amlodipine  and Coreg   Anxiety/depression Continue BuSpar  Continue  xanax  0.25 mg twice daily prn   Chronic pain/neuropathy Continue gabapentin  Hyponatremia Due to volume overload. Sodium level improving. It improved from 121 to 126  Expect to improve with continue HD fluid removal  But she does have  chronic hyponatremia   Vaginal Ulcers patient stated that she has vaginal ulcers and requested to be examined by Gyn  Gynecology consult placed. It seem that Gyn never saw her. But her symptoms seem to have improved  Constipation KUB showed 1.  Constipation. 2. Nonobstructive bowel gas pattern. Started on Miralax  and Colace    DVT prophylaxis: heprarin discontinued to low hemoglobin . Place SCDs  Code Status: DNR Family Communication: No family at bedside  Disposition Plan: Unknown at the moment     Objective: Vitals:   04/11/24 0050 04/11/24 0539 04/11/24 0545 04/11/24 0905  BP: 119/72 116/61  116/64  Pulse: 70 71  68  Resp: 18  20  Temp: 98.1 F (36.7 C) 97.7 F (36.5 C)  98.4 F (36.9 C)  TempSrc: Oral Oral  Oral  SpO2: 91% (!) 71%  94%  Weight:   60.6 kg   Height:       No intake or output data in the 24 hours ending 04/11/24 1504  Filed Weights    04/09/24 0108 04/10/24 0844 04/11/24 0545  Weight: 61.8 kg 66.3 kg 60.6 kg    Examination:  Chronically ill. Awake and alert. Not is distress CVS: S1-S2, regular rhythm No JVD  Resp: Decreased breath sounds to bases, few basilar rales No wheezes,  no crackles  Abdomen exam  No tenderness, +bowel sound  Extremities: 1+ edema Neuro: No focal neurology deficit  Data Reviewed:   CBC: Recent Labs  Lab 04/05/24 0253 04/06/24 0247 04/08/24 0218 04/10/24 0856 04/11/24 1041  WBC 6.6 5.6 5.6 9.2 8.3  HGB 7.5* 7.3* 7.2* 6.9* 8.2*  HCT 23.5* 22.8* 22.5* 22.0* 25.4*  MCV 97.5 98.7 98.3 101.4* 96.2  PLT 196 193 205 243 221   Basic Metabolic Panel: Recent Labs  Lab 04/06/24 0247 04/08/24 0218 04/09/24 0258 04/10/24 0856 04/10/24 1548  NA 125* 127* 124* 121* 126*  K 4.8 4.4 5.3* 5.7* 4.4  CL 88* 88* 87* 85* 90*  CO2 28 28 28 26 24   GLUCOSE 86 96 92 113* 107*  BUN 23 24* 34* 45* 21  CREATININE 2.24* 2.45* 3.55* 3.89* 2.28*  CALCIUM  8.4* 8.4* 8.7* 8.4* 8.4*   GFR: Estimated Creatinine Clearance: 24.2 mL/min (A) (by C-G formula based on SCr of 2.28 mg/dL (H)). Liver Function Tests: No results for input(s): AST, ALT, ALKPHOS, BILITOT, PROT, ALBUMIN  in the last 168 hours.  No results for input(s): LIPASE, AMYLASE in the last 168 hours. No results for input(s): AMMONIA in the last 168 hours. Coagulation Profile: No results for input(s): INR, PROTIME in the last 168 hours. Cardiac Enzymes: No results for input(s): CKTOTAL, CKMB, CKMBINDEX, TROPONINI in the last 168 hours. BNP (last 3 results) Recent Labs    03/10/24 0055 03/13/24 2100 03/23/24 1850  PROBNP >35,000.0* >35,000.0* >35,000.0*   HbA1C: No results for input(s): HGBA1C in the last 72 hours. CBG: Recent Labs  Lab 04/09/24 1959 04/10/24 0517 04/10/24 0824 04/10/24 1419  GLUCAP 98 104* 120* 115*   Lipid Profile: No results for input(s): CHOL, HDL, LDLCALC, TRIG,  CHOLHDL, LDLDIRECT in the last 72 hours. Thyroid Function Tests: No results for input(s): TSH, T4TOTAL, FREET4, T3FREE, THYROIDAB in the last 72 hours. Anemia Panel: No results for input(s): VITAMINB12, FOLATE, FERRITIN, TIBC, IRON , RETICCTPCT in the last 72 hours.  Urine analysis:    Component Value Date/Time   COLORURINE YELLOW 12/30/2023 1305   APPEARANCEUR CLOUDY (A) 12/30/2023 1305   LABSPEC 1.011 12/30/2023 1305   PHURINE 6.0 12/30/2023 1305   GLUCOSEU 50 (A) 12/30/2023 1305   HGBUR SMALL (A) 12/30/2023 1305   BILIRUBINUR NEGATIVE 12/30/2023 1305   KETONESUR NEGATIVE 12/30/2023 1305   PROTEINUR 100 (A) 12/30/2023 1305   UROBILINOGEN 0.2 03/02/2010 2027   NITRITE NEGATIVE 12/30/2023 1305   LEUKOCYTESUR LARGE (A) 12/30/2023 1305   Sepsis Labs: @LABRCNTIP (procalcitonin:4,lacticidven:4)  ) No results found for this or any previous visit (from the past 240 hours).    Radiology Studies: DG Abd 1 View Result Date: 04/10/2024 EXAM: 1 VIEW XRAY OF THE ABDOMEN 04/10/2024 04:52:00 PM COMPARISON: None available. CLINICAL HISTORY: 101717 Nausea 101717 Nausea. FINDINGS: BOWEL: Increased stool burden throughout the colon. Nonobstructive bowel gas pattern.  SOFT TISSUES: Atherosclerotic plaque. BONES: Degenerative changes of the spine. No acute fracture. IMPRESSION: 1.  Constipation. 2. Nonobstructive bowel gas pattern. Electronically signed by: Morgane Naveau MD 04/10/2024 04:58 PM EST RP Workstation: [id number]   DG Chest Port 1 View Result Date: 04/09/2024 EXAM: 1 VIEW XRAY OF THE CHEST 04/09/2024 09:22:00 PM COMPARISON: Comparison with 04/09/2024. CLINICAL HISTORY: SOB (shortness of breath). FINDINGS: LUNGS AND PLEURA: Bilateral pleural effusions with basilar atelectasis or consolidation. Mild perihilar infiltrates may represent edema or pneumonia. Similar appearance to previous study. No pneumothorax. HEART AND MEDIASTINUM: Cardiac enlargement. Mediastinal  contours appear intact. Calcification of the aorta. BONES AND SOFT TISSUES: No acute osseous abnormality. IMPRESSION: 1. Mild perihilar infiltrates, which may represent edema or pneumonia. 2. Bilateral pleural effusions with basilar atelectasis or consolidation, similar to previous study. 3. Cardiac enlargement. Electronically signed by: Elsie Gravely MD 04/09/2024 09:29 PM EST RP Workstation: [id number]       Scheduled Meds:  sodium chloride    Intravenous Once   sodium chloride    Intravenous Once   amLODipine   2.5 mg Oral BID   atorvastatin   10 mg Oral Daily   budesonide -glycopyrrolate -formoterol   2 puff Inhalation BID   busPIRone   5 mg Oral BID   calcitRIOL   0.25 mcg Oral Daily   Chlorhexidine  Gluconate Cloth  6 each Topical Q0600   Chlorhexidine  Gluconate Cloth  6 each Topical Q0600   Chlorhexidine  Gluconate Cloth  6 each Topical Q0600   [START ON 04/12/2024] Chlorhexidine  Gluconate Cloth  6 each Topical Q0600   darbepoetin (ARANESP ) injection - DIALYSIS  150 mcg Subcutaneous Q Wed-1800   docusate sodium   100 mg Oral BID   folic acid   1 mg Oral Daily   gabapentin   100 mg Oral QHS   multivitamin with minerals  1 tablet Oral Daily   nicotine   14 mg Transdermal Daily   pantoprazole   40 mg Oral BID   polyethylene glycol  17 g Oral Daily   thiamine   100 mg Oral Daily   torsemide   100 mg Oral Daily      LOS: 18 days   Remain inpatient because : acute hypoxic respiratory failure complicated by volume overload in the setting of ESRD on hemodialysis     Time spent: 35 min  Ndofunsu Haseeb Fiallos, MD 04/11/2024 3:04 PM  For on call review [email or web address].  "

## 2024-04-11 NOTE — Plan of Care (Signed)
" °  Problem: Clinical Measurements: Goal: Ability to maintain clinical measurements within normal limits will improve Outcome: Progressing   Problem: Activity: Goal: Risk for activity intolerance will decrease Outcome: Progressing   Problem: Nutrition: Goal: Adequate nutrition will be maintained Outcome: Progressing   Problem: Safety: Goal: Ability to remain free from injury will improve Outcome: Progressing   Problem: Coping: Goal: Level of anxiety will decrease Outcome: Not Progressing   Problem: Pain Managment: Goal: General experience of comfort will improve and/or be controlled Outcome: Not Progressing   "

## 2024-04-11 NOTE — Plan of Care (Signed)

## 2024-04-11 NOTE — Progress Notes (Signed)
 " [facility] Progress Note   Subjective:    Seen in room Had HD yest w/ 4.6 L removed   Objective Vitals:   04/11/24 0050 04/11/24 0539 04/11/24 0545 04/11/24 0905  BP: 119/72 116/61  116/64  Pulse: 70 71  68  Resp: 18   20  Temp: 98.1 F (36.7 C) 97.7 F (36.5 C)  98.4 F (36.9 C)  TempSrc: Oral Oral  Oral  SpO2: 91% (!) 71%  94%  Weight:   60.6 kg   Height:       Physical Exam General: chronically ill appearing, 4 L [place] O2 Heart: RRR Lungs: coarse breath sounds, normal wob Abdomen: distended 1-2+  Extremities: 1+ bilat LE edema Dialysis Access: RU AVF +b    Dialysis Orders: from 2024 -->  3h  B350 Heparin  none  RUA AVF No dialysis center currently    Assessment/Plan  ESRD - does not have a regular HD center - when dc'd/ or leaves AMA, pt knows to come to ED for hospital HD  - has been completely noncompliant with outpt HD, thus has no OP unit - in hospital we are on MWF schedule - next HD Monday   Volume overload, AHRF - CXR 1/14 +pulm edema - resp better, however still sig vol overloaded  - due to over-drinking liquids/ ice - asked RNs to be signs in the room re: fluid restriction 1200 cc/d - attempting to manage with HD - can usually tolerate high UF 4-5 L  - came in 70-73kg, now is to 57- 63kg  # hyperkalemia - k+ better today at 4.4  Agitation/ AMS - required CIWA protocol last week - stable now   Liver cirrhosis, Ascites - s/p paracentesis 0.67 L on 1/9 - s/p paracentesis 1.5 L on 1/16  Chronic hyponatremia - same issues as above, due to vol overload in esrd pt  - changed back to renal diet w/ 1200 cc FR - poor prognosis   Chronic homelessness  Anemia of CKD - Hgb 7.0, Venofer  x 2 ordered, ESA started 1/14 and dose increased - Iron  replete - Hb 6.9 , got 2u prbc's w/ HD Sat   HTN - bp's soft 105/ 70 - will dc coreg , and drop norvasc  to 2.5 bid w/ hold orders   Nutrition - changed back to renal diet  -  fluid restriction .  COPD - continue home meds  GOC  - pt is DNR-Limited - very poor prognosis, cannot limit fluid intake      Myer Fret  MD  [facility] 04/11/2024, 11:13 AM  Recent Labs  Lab 04/08/24 0218 04/09/24 0258 04/10/24 0856 04/10/24 1548  HGB 7.2*  --  6.9*  --   CALCIUM  8.4*   < > 8.4* 8.4*  CREATININE 2.45*   < > 3.89* 2.28*  K 4.4   < > 5.7* 4.4   < > = values in this interval not displayed.    Inpatient medications:  sodium chloride    Intravenous Once   sodium chloride    Intravenous Once   amLODipine   2.5 mg Oral BID   atorvastatin   10 mg Oral Daily   budesonide -glycopyrrolate -formoterol   2 puff Inhalation BID   busPIRone   5 mg Oral BID   calcitRIOL   0.25 mcg Oral Daily   Chlorhexidine  Gluconate Cloth  6 each Topical Q0600   Chlorhexidine  Gluconate Cloth  6 each Topical Q0600   Chlorhexidine  Gluconate Cloth  6 each Topical Q0600   darbepoetin (ARANESP ) injection -  DIALYSIS  150 mcg Subcutaneous Q Wed-1800   folic acid   1 mg Oral Daily   gabapentin   100 mg Oral QHS   multivitamin with minerals  1 tablet Oral Daily   nicotine   14 mg Transdermal Daily   pantoprazole   40 mg Oral BID   thiamine   100 mg Oral Daily   torsemide   100 mg Oral Daily      acetaminophen  **OR** acetaminophen , ALPRAZolam , alum & mag hydroxide-simeth, ipratropium-albuterol , melatonin, ondansetron  (ZOFRAN ) IV, mouth rinse, oxyCODONE          "

## 2024-04-12 LAB — CBC
HCT: 25.8 % — ABNORMAL LOW (ref 36.0–46.0)
Hemoglobin: 8.2 g/dL — ABNORMAL LOW (ref 12.0–15.0)
MCH: 30.8 pg (ref 26.0–34.0)
MCHC: 31.8 g/dL (ref 30.0–36.0)
MCV: 97 fL (ref 80.0–100.0)
Platelets: 240 10*3/uL (ref 150–400)
RBC: 2.66 MIL/uL — ABNORMAL LOW (ref 3.87–5.11)
RDW: 18.8 % — ABNORMAL HIGH (ref 11.5–15.5)
WBC: 9.2 10*3/uL (ref 4.0–10.5)
nRBC: 0 % (ref 0.0–0.2)

## 2024-04-12 LAB — RENAL FUNCTION PANEL
Albumin: 3.7 g/dL (ref 3.5–5.0)
Anion gap: 11 (ref 5–15)
BUN: 41 mg/dL — ABNORMAL HIGH (ref 8–23)
CO2: 28 mmol/L (ref 22–32)
Calcium: 8.4 mg/dL — ABNORMAL LOW (ref 8.9–10.3)
Chloride: 83 mmol/L — ABNORMAL LOW (ref 98–111)
Creatinine, Ser: 3.57 mg/dL — ABNORMAL HIGH (ref 0.44–1.00)
GFR, Estimated: 14 mL/min — ABNORMAL LOW
Glucose, Bld: 91 mg/dL (ref 70–99)
Phosphorus: 4.1 mg/dL (ref 2.5–4.6)
Potassium: 4.9 mmol/L (ref 3.5–5.1)
Sodium: 122 mmol/L — ABNORMAL LOW (ref 135–145)

## 2024-04-12 MED ORDER — ANTICOAGULANT SODIUM CITRATE 4% (200MG/5ML) IV SOLN: 5.0000 mL | Status: DC | PRN

## 2024-04-12 MED ORDER — ACETAMINOPHEN 325 MG PO TABS
ORAL_TABLET | ORAL | Status: AC
  Filled 2024-04-12: qty 2

## 2024-04-12 MED ORDER — ALTEPLASE 2 MG IJ SOLR: 2.0000 mg | Freq: Once | INTRAMUSCULAR | Status: DC | PRN

## 2024-04-12 MED ORDER — PENTAFLUOROPROP-TETRAFLUOROETH EX AERO: 1.0000 | INHALATION_SPRAY | CUTANEOUS | Status: DC | PRN

## 2024-04-12 MED ORDER — TRAZODONE HCL 50 MG PO TABS
25.0000 mg | ORAL_TABLET | Freq: Once | ORAL | Status: AC
  Administered 2024-04-12: 25 mg via ORAL
  Filled 2024-04-12: qty 1

## 2024-04-12 MED ORDER — OXYCODONE HCL 5 MG PO TABS
ORAL_TABLET | ORAL | Status: AC
  Filled 2024-04-12: qty 2

## 2024-04-12 MED ORDER — HEPARIN SODIUM (PORCINE) 1000 UNIT/ML DIALYSIS: 1000.0000 [IU] | INTRAMUSCULAR | Status: DC | PRN

## 2024-04-12 MED ORDER — LIDOCAINE-PRILOCAINE 2.5-2.5 % EX CREA: 1.0000 | TOPICAL_CREAM | CUTANEOUS | Status: DC | PRN

## 2024-04-12 MED ORDER — IPRATROPIUM-ALBUTEROL 0.5-2.5 (3) MG/3ML IN SOLN
RESPIRATORY_TRACT | Status: AC
  Filled 2024-04-12: qty 3

## 2024-04-12 MED ORDER — LIDOCAINE HCL (PF) 1 % IJ SOLN: 5.0000 mL | INTRAMUSCULAR | Status: DC | PRN

## 2024-04-12 NOTE — Progress Notes (Signed)
 " [facility] Progress Note   Subjective:    Seen in room   Objective Vitals:   04/12/24 0500 04/12/24 0559 04/12/24 1004 04/12/24 1219  BP: 128/73 136/65 136/65 (!) 154/92  Pulse:  64  88  Resp: 19 17  18   Temp: 97.7 F (36.5 C) (!) 97.5 F (36.4 C)  98.3 F (36.8 C)  TempSrc: Oral Oral  Oral  SpO2: 91% (!) 68%  97%  Weight:      Height:       Physical Exam General: chronically ill appearing, 4 L [place] O2 Heart: RRR Lungs: coarse breath sounds, normal wob Abdomen: distended 1-2+  Extremities: 1+ bilat LE edema Dialysis Access: RU AVF +b    Dialysis Orders: from 2024 -->  3h  B350 Heparin  none  RUA AVF No dialysis center currently    Assessment/Plan  ESRD - does not have a regular HD center - when dc'd/ or leaves AMA, pt knows to come to ED for hospital HD  - has been completely noncompliant with outpt HD, thus has no OP unit - in hospital we are on MWF schedule - next HD today  Volume overload, AHRF - CXR 1/14 +pulm edema - resp better, however still sig vol overloaded  - due to over-drinking liquids/ ice - asked RNs to be signs in the room re: fluid restriction 1200 cc/d - attempting to manage with HD - can usually tolerate high UF 4-5 L  - came in 70-73kg, now is to 57- 63kg  # hyperkalemia - better yest at 4.4  Agitation/ AMS - required CIWA protocol last week - stable now   Liver cirrhosis, Ascites - s/p paracentesis 0.67 L on 1/9 - s/p paracentesis 1.5 L on 1/16  Chronic hyponatremia - same issues as above, due to vol overload in esrd pt  - changed back to renal diet w/ 1200 cc FR - poor prognosis   Chronic homelessness  Anemia of CKD - Hgb 7.0, Venofer  x 2 ordered, ESA started 1/14 and dose increased - Iron  replete - Hb 6.9 , got 2u prbc's w/ HD Sat   HTN - bp's soft Sunday at 105/ 70 - dc'd coreg  and dropped norvasc  to 2.5 bid w/ hold orders yesterday - watch bp's   Nutrition - changed back to renal diet  -  fluid restriction .  COPD - continue home meds  GOC  - pt is DNR-Limited - very poor prognosis w/ multi organ failure and no social support       Myer Fret  MD  [facility] 04/12/2024, 12:41 PM  Recent Labs  Lab 04/10/24 0856 04/10/24 1548 04/11/24 1041 04/12/24 0212  HGB 6.9*  --  8.2* 8.2*  CALCIUM  8.4* 8.4*  --   --   CREATININE 3.89* 2.28*  --   --   K 5.7* 4.4  --   --     Inpatient medications:  sodium chloride    Intravenous Once   sodium chloride    Intravenous Once   amLODipine   2.5 mg Oral BID   atorvastatin   10 mg Oral Daily   budesonide -glycopyrrolate -formoterol   2 puff Inhalation BID   busPIRone   5 mg Oral BID   calcitRIOL   0.25 mcg Oral Daily   Chlorhexidine  Gluconate Cloth  6 each Topical Q0600   Chlorhexidine  Gluconate Cloth  6 each Topical Q0600   Chlorhexidine  Gluconate Cloth  6 each Topical Q0600   Chlorhexidine  Gluconate Cloth  6 each Topical Q0600   darbepoetin (ARANESP )  injection - DIALYSIS  150 mcg Subcutaneous Q Wed-1800   docusate sodium   100 mg Oral BID   folic acid   1 mg Oral Daily   gabapentin   100 mg Oral QHS   multivitamin with minerals  1 tablet Oral Daily   nicotine   14 mg Transdermal Daily   pantoprazole   40 mg Oral BID   polyethylene glycol  17 g Oral Daily   thiamine   100 mg Oral Daily   torsemide   100 mg Oral Daily    anticoagulant sodium citrate        acetaminophen  **OR** acetaminophen , ALPRAZolam , alteplase , alum & mag hydroxide-simeth, anticoagulant sodium citrate , heparin , ipratropium-albuterol , lidocaine  (PF), lidocaine -prilocaine , melatonin, ondansetron  (ZOFRAN ) IV, mouth rinse, oxyCODONE , pentafluoroprop-tetrafluoroeth         "

## 2024-04-12 NOTE — Plan of Care (Signed)

## 2024-04-12 NOTE — Progress Notes (Signed)
 Received patient in bed to unit.  Alert and oriented.  Informed consent signed and in chart.   TX duration:3.5 hours  Patient tolerated well.  Transported back to the room  Alert, without acute distress.  Hand-off given to patient's nurse.   Access used: Right Upper Arm fistula Access issues: none  Total UF removed: 5L Medication(s) given: Tylenol , Oxycodone , Albuterol    04/12/24 1855  Vitals  Temp (!) 97.5 F (36.4 C)  Temp Source Oral  BP (!) 128/48  MAP (mmHg) 73  Pulse Rate 72  ECG Heart Rate 74  Resp (!) 25  Weight 59.2 kg  Type of Weight Post-Dialysis  Oxygen  Therapy  SpO2 92 %  O2 Device Nasal Cannula  O2 Flow Rate (L/min) 3 L/min  During Treatment Monitoring  Duration of HD Treatment -hour(s) 3.5 hour(s)  HD Safety Checks Performed Yes  Intra-Hemodialysis Comments Tx completed  Post Treatment  Dialyzer Clearance Heavily streaked  Liters Processed 73.4  Fluid Removed (mL) 5000 mL  Tolerated HD Treatment Yes  AVG/AVF Arterial Site Held (minutes) 7 minutes  AVG/AVF Venous Site Held (minutes) 7 minutes  Fistula / Graft Right Upper arm Arteriovenous fistula  Placement Date/Time: 08/14/22 1230   Orientation: Right  Access Location: (c) Upper arm  Access Type: Arteriovenous fistula  Site Condition No complications  Fistula / Graft Assessment Present;Thrill;Bruit  Status Patent;Deaccessed  Drainage Description None     Cynthia Brasil LPN [facility]

## 2024-04-12 NOTE — Progress Notes (Signed)
 " PROGRESS NOTE    [name]  FMW:[id number] DOB: 1960/05/30 DOA: 03/23/2024 PCP: Pcp, No   64 y/o female with hx of  COPD supposed to be on home oxygen , ESRD on HD, noncompliance due to homelessness, polysubstance abuse, diastolic CHF, hypertension, dyslipidemia, anxiety, cirrhosis, ascites who presented to   ED with chest pain and shortness of breath.  Admitted, started on hemodialysis, volume status improving -On 1/13 -14 noted to have worsening agitation and confusion, ABG with severe respiratory acidosis, placed on BiPAP, chest x-ray with worsening pulmonary edema She has been refusing  HD Rx even in the hospital and cutting HD short On  1/16, respiratory status improving, increased abdominal distention, paracentesis-1.5 L of clear blood-tinged peritoneal fluid drained -1/17 sustained a fall - 1/17, complaining of increased abdominal pain despite paracentesis On  CT 1/18: Right lateral abdominal wall hematoma Patient is still volume overload and continue to require HD   Subjective: Seen today this morning > Resting in bed and stated to feel fine. Denies abdominal pain, no nausea,no vomiting Assessment and Plan:  Acute on chronic hypoxic and hypercarbic respiratory failure ESRD on HD, volume overload, pulmonary edema Metabolic encephalopathy Noncompliance with HD Volume overload due to  pulmonary edema and pleural effusions on repeat x-ray again yesterday morning. She had HD and paracentesis.She is improving.She does not complaint of sob. Still has oxygen  supplement with her but she stated that she does not need it.  S/P paracentesis 1/16 Noted to be noncompliance to HD  Still volume overload  HD schedule Today. Inpatient schedule MWF  Liver cirrhosis History of EtOH abuse, ascites Abdominal pain S/p paracentesis 1/9, 675 mL fluid drained, And paracentesis 1/16, only 1.5 L drained, Repeat  CT >small ascites, cirrhosis, right lateral abdominal wall hematoma Heparin   discontinued .   Right lateral abdominal wall hematoma  Due to fall  Off  heparin  SQ-day 3 Continue  supportive care  Acute on chronic diastolic CHF  Volume managed with HD and paracentesis  Transient chest pain Resolved Troponin minimally elevated, no ACS Echo with preserved EF, global hypokinesis asymptomatic  Acute on chronic anemia Hemoglobin around 8.2. Post transfusion and stable  No active bleeding S/p transfusion 2 unit of packed RBC . Hemoglobin stable   Severe COPD/chronic respiratory failure Continue current inhalers and supplemental O2 as needed She told me that she does not use oxygen  all the night. Only as needed   Hypertension Continue amlodipine  and Coreg   Anxiety/depression Continue BuSpar  Continue  xanax  0.25 mg twice daily prn   Chronic pain/neuropathy Continue gabapentin  Hyponatremia Due to volume overload. Sodium level improving. It improved from 121 to 126  Expect to improve with continue HD fluid removal  But she does have  chronic hyponatremia   Vaginal Ulcers patient stated that she has vaginal ulcers and requested to be examined by Gyn  Gynecology consult placed. It seem that Gyn never saw her. But her symptoms seem to have improved  Constipation KUB showed 1.  Constipation. 2. Nonobstructive bowel gas pattern. Continue Miralax  and Colace    DVT prophylaxis: heprarin discontinued to low hemoglobin . Place SCDs  Code Status: DNR Family Communication: No family at bedside  Disposition Plan: Case with case production designer, theatre/television/film. Plan is to go live with her ex husband or Hotel. Will need to come back to hospital for dialysis since she was complaint with HD prior to admission.She is still volemic and not ready for discharge    Objective: Vitals:   04/12/24 1421 04/12/24 1436  04/12/24 1445 04/12/24 1448  BP:  (!) 137/59  (!) 131/58  Pulse:  73 100 67  Resp:  16 13 14   Temp:      TempSrc:      SpO2: (!) 85% (!) 85% 94% 90%  Weight:      Height:        No intake or output data in the 24 hours ending 04/12/24 1505  Filed Weights   04/10/24 0844 04/11/24 0545 04/12/24 1403  Weight: 66.3 kg 60.6 kg 64.2 kg    Examination:  Chronically ill. Awake and alert. Not is distress CVS: S1-S2, regular rhythm No JVD  Resp: Decreased breath sounds to bases, few basilar rales No wheezes,  no crackles  Abdomen exam  No tenderness, +bowel sound  Extremities: 2+ edema Neuro: A&0 x 3. No focal deficit  Data Reviewed:   CBC: Recent Labs  Lab 04/06/24 0247 04/08/24 0218 04/10/24 0856 04/11/24 1041 04/12/24 0212  WBC 5.6 5.6 9.2 8.3 9.2  HGB 7.3* 7.2* 6.9* 8.2* 8.2*  HCT 22.8* 22.5* 22.0* 25.4* 25.8*  MCV 98.7 98.3 101.4* 96.2 97.0  PLT 193 205 243 221 240   Basic Metabolic Panel: Recent Labs  Lab 04/08/24 0218 04/09/24 0258 04/10/24 0856 04/10/24 1548 04/12/24 1157  NA 127* 124* 121* 126* 122*  K 4.4 5.3* 5.7* 4.4 4.9  CL 88* 87* 85* 90* 83*  CO2 28 28 26 24 28   GLUCOSE 96 92 113* 107* 91  BUN 24* 34* 45* 21 41*  CREATININE 2.45* 3.55* 3.89* 2.28* 3.57*  CALCIUM  8.4* 8.7* 8.4* 8.4* 8.4*  PHOS  --   --   --   --  4.1   GFR: Estimated Creatinine Clearance: 15.7 mL/min (A) (by C-G formula based on SCr of 3.57 mg/dL (H)). Liver Function Tests: Recent Labs  Lab 04/12/24 1157  ALBUMIN  3.7    No results for input(s): LIPASE, AMYLASE in the last 168 hours. No results for input(s): AMMONIA in the last 168 hours. Coagulation Profile: No results for input(s): INR, PROTIME in the last 168 hours. Cardiac Enzymes: No results for input(s): CKTOTAL, CKMB, CKMBINDEX, TROPONINI in the last 168 hours. BNP (last 3 results) Recent Labs    03/10/24 0055 03/13/24 2100 03/23/24 1850  PROBNP >35,000.0* >35,000.0* >35,000.0*   HbA1C: No results for input(s): HGBA1C in the last 72 hours. CBG: Recent Labs  Lab 04/09/24 1959 04/10/24 0517 04/10/24 0824 04/10/24 1419  GLUCAP 98 104* 120* 115*   Lipid  Profile: No results for input(s): CHOL, HDL, LDLCALC, TRIG, CHOLHDL, LDLDIRECT in the last 72 hours. Thyroid Function Tests: No results for input(s): TSH, T4TOTAL, FREET4, T3FREE, THYROIDAB in the last 72 hours. Anemia Panel: No results for input(s): VITAMINB12, FOLATE, FERRITIN, TIBC, IRON , RETICCTPCT in the last 72 hours.  Urine analysis:    Component Value Date/Time   COLORURINE YELLOW 12/30/2023 1305   APPEARANCEUR CLOUDY (A) 12/30/2023 1305   LABSPEC 1.011 12/30/2023 1305   PHURINE 6.0 12/30/2023 1305   GLUCOSEU 50 (A) 12/30/2023 1305   HGBUR SMALL (A) 12/30/2023 1305   BILIRUBINUR NEGATIVE 12/30/2023 1305   KETONESUR NEGATIVE 12/30/2023 1305   PROTEINUR 100 (A) 12/30/2023 1305   UROBILINOGEN 0.2 03/02/2010 2027   NITRITE NEGATIVE 12/30/2023 1305   LEUKOCYTESUR LARGE (A) 12/30/2023 1305   Sepsis Labs: @LABRCNTIP (procalcitonin:4,lacticidven:4)  ) No results found for this or any previous visit (from the past 240 hours).    Radiology Studies: DG Abd 1 View Result Date: 04/10/2024 EXAM: 1 VIEW XRAY  OF THE ABDOMEN 04/10/2024 04:52:00 PM COMPARISON: None available. CLINICAL HISTORY: 101717 Nausea 101717 Nausea. FINDINGS: BOWEL: Increased stool burden throughout the colon. Nonobstructive bowel gas pattern. SOFT TISSUES: Atherosclerotic plaque. BONES: Degenerative changes of the spine. No acute fracture. IMPRESSION: 1.  Constipation. 2. Nonobstructive bowel gas pattern. Electronically signed by: Morgane Naveau MD 04/10/2024 04:58 PM EST RP Workstation: [id number]       Scheduled Meds:  sodium chloride    Intravenous Once   sodium chloride    Intravenous Once   amLODipine   2.5 mg Oral BID   atorvastatin   10 mg Oral Daily   budesonide -glycopyrrolate -formoterol   2 puff Inhalation BID   busPIRone   5 mg Oral BID   calcitRIOL   0.25 mcg Oral Daily   Chlorhexidine  Gluconate Cloth  6 each Topical Q0600   Chlorhexidine  Gluconate Cloth  6 each Topical  Q0600   Chlorhexidine  Gluconate Cloth  6 each Topical Q0600   Chlorhexidine  Gluconate Cloth  6 each Topical Q0600   darbepoetin (ARANESP ) injection - DIALYSIS  150 mcg Subcutaneous Q Wed-1800   docusate sodium   100 mg Oral BID   folic acid   1 mg Oral Daily   gabapentin   100 mg Oral QHS   multivitamin with minerals  1 tablet Oral Daily   nicotine   14 mg Transdermal Daily   pantoprazole   40 mg Oral BID   polyethylene glycol  17 g Oral Daily   thiamine   100 mg Oral Daily   torsemide   100 mg Oral Daily    anticoagulant sodium citrate        LOS: 19 days   Remain inpatient because : acute hypoxic respiratory failure complicated by volume overload in the setting of ESRD on hemodialysis . Still volume overload      Time spent: 35 min  Ndofunsu Sinclair Arrazola, MD 04/12/2024 3:05 PM  For on call review [email or web address]  "

## 2024-04-13 MED ORDER — TRAZODONE HCL 50 MG PO TABS
25.0000 mg | ORAL_TABLET | Freq: Once | ORAL | Status: AC
  Administered 2024-04-13: 25 mg via ORAL
  Filled 2024-04-13: qty 1

## 2024-04-13 NOTE — Plan of Care (Signed)

## 2024-04-13 NOTE — Progress Notes (Signed)
 " PROGRESS NOTE    [name]  FMW:[id number] DOB: 31-Dec-1960 DOA: 03/23/2024 PCP: Pcp, No   64 y/o female with hx of  COPD supposed to be on home oxygen , ESRD on HD, noncompliance due to homelessness, polysubstance abuse, diastolic CHF, hypertension, dyslipidemia, anxiety, cirrhosis, ascites who presented to   ED with chest pain and shortness of breath.  Admitted, started on hemodialysis, volume status improving -On 1/13 -14 noted to have worsening agitation and confusion, ABG with severe respiratory acidosis, placed on BiPAP, chest x-ray with worsening pulmonary edema She has been refusing  HD Rx even in the hospital and cutting HD short On  1/16, respiratory status improving, increased abdominal distention, paracentesis-1.5 L of clear blood-tinged peritoneal fluid drained -1/17 sustained a fall - 1/17, complaining of increased abdominal pain despite paracentesis On  CT 1/18: Right lateral abdominal wall hematoma Patient has been undergoing dialysis. Next dialysis tomorrow.  Subjective: Seen today this morning > No overnight events. She did sleep well. Eating good. Moving her bowel. Denies abdominal pain, no nausea,no vomiting Assessment and Plan:  Acute on chronic hypoxic and hypercarbic respiratory failure ESRD on HD, volume overload, pulmonary edema Metabolic encephalopathy Noncompliance with HD Volume overload due to  pulmonary edema and pleural effusions on repeat x-ray again yesterday morning. She had HD and paracentesis.She is improving.She does not complaint of sob. Still has oxygen  supplement with her but she stated that she does not need it.  S/P paracentesis 1/16 Noted to be noncompliance to HD  Still volume overload  Inpatient schedule MWF Next dialysis tomorrow   Liver cirrhosis History of EtOH abuse, ascites Abdominal pain S/p paracentesis 1/9, 675 mL fluid drained, And paracentesis 1/16, only 1.5 L drained, Repeat  CT >small ascites, cirrhosis, right lateral  abdominal wall hematoma Heparin  discontinued .   Right lateral abdominal wall hematoma  Due to fall  Off  heparin  SQ-day 3 Continue  supportive care  Acute on chronic diastolic CHF  Volume managed with HD and paracentesis  Transient chest pain Resolved Troponin minimally elevated, no ACS Echo with preserved EF, global hypokinesis asymptomatic  Acute on chronic anemia Hemoglobin around 8.2. Post transfusion and stable  No active bleeding S/p transfusion 2 unit of packed RBC . Hemoglobin stable   Severe COPD/chronic respiratory failure Continue current inhalers and supplemental O2 as needed She told me that she does not use oxygen  all the night. Only as needed   Hypertension Continue amlodipine  and Coreg   Anxiety/depression Continue BuSpar  Continue  xanax  0.25 mg twice daily prn   Chronic pain/neuropathy Continue gabapentin  Hyponatremia Due to volume overload. Sodium level improving. It improved from 121 to 126  Expect to improve with continue HD fluid removal  But she does have  chronic hyponatremia   Vaginal Ulcers patient stated that she has vaginal ulcers and requested to be examined by Gyn  Gynecology consult placed. It seem that Gyn never saw her. But her symptoms seem to have improved  Constipation KUB showed 1.  Constipation. 2. Nonobstructive bowel gas pattern. Continue Miralax  and Colace    DVT prophylaxis: heprarin discontinued to low hemoglobin . Place SCDs  Code Status: DNR Family Communication: No family at bedside  Disposition Plan: Case with case production designer, theatre/television/film. Plan is to go live with her ex husband or Hotel. Will need to come back to hospital for dialysis since she was complaint with HD prior to admission.Patient continue to improve . Next dialysis tomorrow.  She request to speak to social worker for her  transportation, Need new albuterol  machine.     Objective: Vitals:   04/12/24 2105 04/13/24 0513 04/13/24 0753 04/13/24 1548  BP:  (!) 132/59  134/79 138/64  Pulse:  84 85 80  Resp:  17    Temp:  98.8 F (37.1 C) 98.7 F (37.1 C) 98.2 F (36.8 C)  TempSrc:  Oral  Oral  SpO2: 95% 91% 90% 94%  Weight:      Height:        Intake/Output Summary (Last 24 hours) at 04/13/2024 1550 Last data filed at 04/12/2024 1855 Gross per 24 hour  Intake --  Output 5000 ml  Net -5000 ml    Filed Weights   04/11/24 0545 04/12/24 1403 04/12/24 1855  Weight: 60.6 kg 64.2 kg 59.2 kg    Examination:  Chronically ill. Awake and alert. Not is distress CVS: S1-S2, regular rhythm No JVD  Resp: Decreased breath sounds to bases, few basilar rales No wheezes,  no crackles  Abdomen exam  No tenderness, +bowel sound  Extremities: 2+ edema Neuro: A&0 x 3. No focal deficit  Data Reviewed:   CBC: Recent Labs  Lab 04/08/24 0218 04/10/24 0856 04/11/24 1041 04/12/24 0212  WBC 5.6 9.2 8.3 9.2  HGB 7.2* 6.9* 8.2* 8.2*  HCT 22.5* 22.0* 25.4* 25.8*  MCV 98.3 101.4* 96.2 97.0  PLT 205 243 221 240   Basic Metabolic Panel: Recent Labs  Lab 04/08/24 0218 04/09/24 0258 04/10/24 0856 04/10/24 1548 04/12/24 1157  NA 127* 124* 121* 126* 122*  K 4.4 5.3* 5.7* 4.4 4.9  CL 88* 87* 85* 90* 83*  CO2 28 28 26 24 28   GLUCOSE 96 92 113* 107* 91  BUN 24* 34* 45* 21 41*  CREATININE 2.45* 3.55* 3.89* 2.28* 3.57*  CALCIUM  8.4* 8.7* 8.4* 8.4* 8.4*  PHOS  --   --   --   --  4.1   GFR: Estimated Creatinine Clearance: 15.1 mL/min (A) (by C-G formula based on SCr of 3.57 mg/dL (H)). Liver Function Tests: Recent Labs  Lab 04/12/24 1157  ALBUMIN  3.7    No results for input(s): LIPASE, AMYLASE in the last 168 hours. No results for input(s): AMMONIA in the last 168 hours. Coagulation Profile: No results for input(s): INR, PROTIME in the last 168 hours. Cardiac Enzymes: No results for input(s): CKTOTAL, CKMB, CKMBINDEX, TROPONINI in the last 168 hours. BNP (last 3 results) Recent Labs    03/10/24 0055 03/13/24 2100  03/23/24 1850  PROBNP >35,000.0* >35,000.0* >35,000.0*   HbA1C: No results for input(s): HGBA1C in the last 72 hours. CBG: Recent Labs  Lab 04/09/24 1959 04/10/24 0517 04/10/24 0824 04/10/24 1419  GLUCAP 98 104* 120* 115*   Lipid Profile: No results for input(s): CHOL, HDL, LDLCALC, TRIG, CHOLHDL, LDLDIRECT in the last 72 hours. Thyroid Function Tests: No results for input(s): TSH, T4TOTAL, FREET4, T3FREE, THYROIDAB in the last 72 hours. Anemia Panel: No results for input(s): VITAMINB12, FOLATE, FERRITIN, TIBC, IRON , RETICCTPCT in the last 72 hours.  Urine analysis:    Component Value Date/Time   COLORURINE YELLOW 12/30/2023 1305   APPEARANCEUR CLOUDY (A) 12/30/2023 1305   LABSPEC 1.011 12/30/2023 1305   PHURINE 6.0 12/30/2023 1305   GLUCOSEU 50 (A) 12/30/2023 1305   HGBUR SMALL (A) 12/30/2023 1305   BILIRUBINUR NEGATIVE 12/30/2023 1305   KETONESUR NEGATIVE 12/30/2023 1305   PROTEINUR 100 (A) 12/30/2023 1305   UROBILINOGEN 0.2 03/02/2010 2027   NITRITE NEGATIVE 12/30/2023 1305   LEUKOCYTESUR LARGE (A) 12/30/2023 1305  Sepsis Labs: @LABRCNTIP (procalcitonin:4,lacticidven:4)  ) No results found for this or any previous visit (from the past 240 hours).    Radiology Studies: No results found.      Scheduled Meds:  sodium chloride    Intravenous Once   sodium chloride    Intravenous Once   amLODipine   2.5 mg Oral BID   atorvastatin   10 mg Oral Daily   budesonide -glycopyrrolate -formoterol   2 puff Inhalation BID   busPIRone   5 mg Oral BID   calcitRIOL   0.25 mcg Oral Daily   Chlorhexidine  Gluconate Cloth  6 each Topical Q0600   Chlorhexidine  Gluconate Cloth  6 each Topical Q0600   Chlorhexidine  Gluconate Cloth  6 each Topical Q0600   Chlorhexidine  Gluconate Cloth  6 each Topical Q0600   darbepoetin (ARANESP ) injection - DIALYSIS  150 mcg Subcutaneous Q Wed-1800   docusate sodium   100 mg Oral BID   folic acid   1 mg Oral Daily    gabapentin   100 mg Oral QHS   multivitamin with minerals  1 tablet Oral Daily   nicotine   14 mg Transdermal Daily   pantoprazole   40 mg Oral BID   polyethylene glycol  17 g Oral Daily   thiamine   100 mg Oral Daily   torsemide   100 mg Oral Daily       LOS: 20 days   Remain inpatient because : acute hypoxic respiratory failure complicated by volume overload in the setting of ESRD on hemodialysis . Next dialysis tomorrow. She can be discharged tomorrow  after dialysis if she has transportation and arrangement are made how she will getting her dialysis   Time spent: 35 min  Ndofunsu Mickle, MD 04/13/2024 3:50 PM  For on call review [email or web address]  "

## 2024-04-13 NOTE — TOC Progression Note (Addendum)
 Transition of Care [facility]) - Progression Note    Patient Details  Name: SERINITY WARE MRN: [id number] Date of Birth: [birth year]  [facility] CM/SW Contact  Roxie KANDICE Stain, RN Phone Number: 04/13/2024, 3:08 PM  Clinical Narrative:     Patient requesting a new nebulizer stating hers got stolen. Notified Zach with [employer] of new nebulizer machine order.  Zack will adapt notified this RNCM that the nebulizer machine will be out of pocket and [employer] is attempting to reach patient.    Expected Discharge Plan:  (TBD) Barriers to Discharge: Continued Medical Work up               Expected Discharge Plan and Services In-house Referral: Clinical Social Work Discharge Planning Services: CM Consult   Living arrangements for the past 2 months: Hotel/Motel, Homeless                 DME Arranged: Community education officer DME Agency: [employer] Date DME Agency Contacted: 04/13/24 Time DME Agency Contacted: [phone] Representative spoke with at DME Agency: Zack             Social Drivers of Health (SDOH) Interventions SDOH Screenings   Food Insecurity: Food Insecurity Present (03/10/2024)  Housing: High Risk (03/26/2024)  Transportation Needs: Unmet Transportation Needs (03/26/2024)  Utilities: At Risk (03/26/2024)  Alcohol  Screen: Low Risk (02/10/2023)  Depression (PHQ2-9): Medium Risk (06/11/2023)  Financial Resource Strain: High Risk (02/10/2023)  Physical Activity: Inactive (02/10/2023)  Social Connections: Socially Isolated (03/10/2024)  Tobacco Use: High Risk (03/23/2024)  Health Literacy: Adequate Health Literacy (02/10/2023)    Readmission Risk Interventions    01/26/2024    4:49 PM 01/14/2024    9:38 AM 07/01/2023   12:39 PM  Readmission Risk Prevention Plan  Transportation Screening Complete Complete Complete  Medication Review (RN Care Manager) Complete Referral to Pharmacy Referral to Pharmacy  PCP or Specialist appointment within 3-5 days of discharge   Complete Complete  HRI or Home Care Consult Complete Complete Complete  SW Recovery Care/Counseling Consult  Complete Complete  Palliative Care Screening Not Applicable Not Applicable Not Applicable  Skilled Nursing Facility Not Applicable Not Applicable Not Applicable
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
## 2024-04-13 NOTE — Progress Notes (Signed)
 " [facility] Progress Note   Subjective:    Seen in room, feels much better, walking the hallways. States she is able to go home now. Tolerated HD yest with no issues.   Objective Vitals:   04/12/24 2100 04/12/24 2105 04/13/24 0513 04/13/24 0753  BP:   (!) 132/59 134/79  Pulse:   84 85  Resp:   17   Temp:   98.8 F (37.1 C) 98.7 F (37.1 C)  TempSrc:   Oral   SpO2: 95% 95% 91% 90%  Weight:      Height:       Physical Exam General: chronically ill appearing, 4 L [place] O2 Heart: RRR Lungs: coarse breath sounds, normal wob Abdomen: distended 1-2+  Extremities: tr bilat LE edema Dialysis Access: RU AVF +b    Dialysis Orders: from 2024 -->  3h  B350 Heparin  none  RUA AVF No dialysis center currently    Assessment/Plan  ESRD - does not have a regular HD center - when dc'd/ or leaves AMA, pt knows to come to ED for hospital HD  - has been completely noncompliant with outpt HD, thus has no OP unit - in hospital we are on MWF schedule; tolerated tx Mon w 5L UF - next HD tomorrow if she's still here  Volume overload, AHRF - CXR 1/14 +pulm edema - resp better, however still vol overloaded  - due to over-drinking liquids/ ice - asked RNs to be signs in the room re: fluid restriction 1200 cc/d - attempting to manage with HD - can usually tolerate high UF 4-5 L  - came in 70-73kg, now is to 57- 63kg  # hyperkalemia - better after tx  Agitation/ AMS - required CIWA protocol last week - stable now   Liver cirrhosis, Ascites - s/p paracentesis 0.67 L on 1/9 - s/p paracentesis 1.5 L on 1/16  Chronic hyponatremia - same issues as above, due to vol overload in esrd pt  - changed back to renal diet w/ 1200 cc FR - poor prognosis   Chronic homelessness  Anemia of CKD - Hgb 7.0, Venofer  x 2 ordered, ESA started 1/14 and dose increased - Iron  replete - Hb 6.9 , got 2u prbc's w/ HD Sat   HTN - bp's soft Sunday at 105/ 70 - dc'd coreg  and dropped  norvasc  to 2.5 bid w/ hold orders yesterday - watch bp's   Nutrition - changed back to renal diet  - fluid restriction .  COPD - continue home meds  GOC  - pt is DNR-Limited - very poor prognosis w/ multi organ failure and no social support    Recent Labs  Lab 04/10/24 1548 04/11/24 1041 04/12/24 0212 04/12/24 1157  HGB  --  8.2* 8.2*  --   ALBUMIN   --   --   --  3.7  CALCIUM  8.4*  --   --  8.4*  PHOS  --   --   --  4.1  CREATININE 2.28*  --   --  3.57*  K 4.4  --   --  4.9    Inpatient medications:  sodium chloride    Intravenous Once   sodium chloride    Intravenous Once   amLODipine   2.5 mg Oral BID   atorvastatin   10 mg Oral Daily   budesonide -glycopyrrolate -formoterol   2 puff Inhalation BID   busPIRone   5 mg Oral BID   calcitRIOL   0.25 mcg Oral Daily   Chlorhexidine  Gluconate Cloth  6 each  Topical Q0600   Chlorhexidine  Gluconate Cloth  6 each Topical Q0600   Chlorhexidine  Gluconate Cloth  6 each Topical Q0600   Chlorhexidine  Gluconate Cloth  6 each Topical Q0600   darbepoetin (ARANESP ) injection - DIALYSIS  150 mcg Subcutaneous Q Wed-1800   docusate sodium   100 mg Oral BID   folic acid   1 mg Oral Daily   gabapentin   100 mg Oral QHS   multivitamin with minerals  1 tablet Oral Daily   nicotine   14 mg Transdermal Daily   pantoprazole   40 mg Oral BID   polyethylene glycol  17 g Oral Daily   thiamine   100 mg Oral Daily   torsemide   100 mg Oral Daily       acetaminophen  **OR** acetaminophen , ALPRAZolam , alum & mag hydroxide-simeth, ipratropium-albuterol , melatonin, ondansetron  (ZOFRAN ) IV, mouth rinse, oxyCODONE          "

## 2024-04-14 LAB — CBC WITH DIFFERENTIAL/PLATELET
Abs Immature Granulocytes: 0.06 10*3/uL (ref 0.00–0.07)
Basophils Absolute: 0 10*3/uL (ref 0.0–0.1)
Basophils Relative: 1 %
Eosinophils Absolute: 0.2 10*3/uL (ref 0.0–0.5)
Eosinophils Relative: 3 %
HCT: 26.1 % — ABNORMAL LOW (ref 36.0–46.0)
Hemoglobin: 8.5 g/dL — ABNORMAL LOW (ref 12.0–15.0)
Immature Granulocytes: 1 %
Lymphocytes Relative: 8 %
Lymphs Abs: 0.5 10*3/uL — ABNORMAL LOW (ref 0.7–4.0)
MCH: 31.1 pg (ref 26.0–34.0)
MCHC: 32.6 g/dL (ref 30.0–36.0)
MCV: 95.6 fL (ref 80.0–100.0)
Monocytes Absolute: 0.9 10*3/uL (ref 0.1–1.0)
Monocytes Relative: 15 %
Neutro Abs: 4.6 10*3/uL (ref 1.7–7.7)
Neutrophils Relative %: 72 %
Platelets: 314 10*3/uL (ref 150–400)
RBC: 2.73 MIL/uL — ABNORMAL LOW (ref 3.87–5.11)
RDW: 17.7 % — ABNORMAL HIGH (ref 11.5–15.5)
WBC: 6.3 10*3/uL (ref 4.0–10.5)
nRBC: 0 % (ref 0.0–0.2)

## 2024-04-14 LAB — BASIC METABOLIC PANEL WITH GFR
Anion gap: 16 — ABNORMAL HIGH (ref 5–15)
BUN: 44 mg/dL — ABNORMAL HIGH (ref 8–23)
CO2: 25 mmol/L (ref 22–32)
Calcium: 8.6 mg/dL — ABNORMAL LOW (ref 8.9–10.3)
Chloride: 82 mmol/L — ABNORMAL LOW (ref 98–111)
Creatinine, Ser: 3.69 mg/dL — ABNORMAL HIGH (ref 0.44–1.00)
GFR, Estimated: 13 mL/min — ABNORMAL LOW
Glucose, Bld: 101 mg/dL — ABNORMAL HIGH (ref 70–99)
Potassium: 5 mmol/L (ref 3.5–5.1)
Sodium: 123 mmol/L — ABNORMAL LOW (ref 135–145)

## 2024-04-14 MED ORDER — IPRATROPIUM-ALBUTEROL 0.5-2.5 (3) MG/3ML IN SOLN
RESPIRATORY_TRACT | Status: AC
  Filled 2024-04-14: qty 3

## 2024-04-14 NOTE — Progress Notes (Signed)
 " PROGRESS NOTE    [name]  FMW:[id number] DOB: 08-Jul-1960 DOA: 03/23/2024 PCP: Pcp, No   Brief Narrative:  64 y/o female with hx of  COPD supposed to be on home oxygen , ESRD on HD, noncompliance due to homelessness, polysubstance abuse, diastolic CHF, hypertension, dyslipidemia, anxiety, cirrhosis, ascites who presented to ED with chest pain and shortness of breath. Admitted, started on hemodialysis, volume status improving -On 1/13 -14 noted to have worsening agitation and confusion, ABG with severe respiratory acidosis, placed on BiPAP, chest x-ray with worsening pulmonary edema She has been refusing  HD Rx even in the hospital and cutting HD short On  1/16, respiratory status improving, increased abdominal distention, paracentesis-1.5 L of clear blood-tinged peritoneal fluid drained -1/17 sustained a fall - 1/17, complaining of increased abdominal pain despite paracentesis On  CT 1/18: Right lateral abdominal wall hematoma Patient has been undergoing dialysis.   Assessment & Plan:   Principal Problem:   Volume overload Active Problems:   CHF exacerbation (HCC)   ESRD on dialysis (HCC)   Hyperkalemia   COPD (chronic obstructive pulmonary disease) (HCC)    Acute on chronic hypoxic and hypercarbic respiratory failure/acute on chronic diastolic congestive heart failure ESRD on HD, volume overload, pulmonary edema Metabolic encephalopathy Noncompliance with HD Volume overload due to pulmonary edema and pleural effusions on repeat x-ray. She had HD and paracentesis. She is improving. She does not complaint of sob. Still has oxygen  supplement with her but she stated that she does not need it.  S/P paracentesis 1/16 Noted to be noncompliance to HD  Inpatient schedule MWF Next dialysis today/per nephrology 8 will be third shift due to high census, patient tells me that she has made arrangements to live with her ex and she would like to be discharged, per nephrology, she does not  have any outpatient HD clinic, she tends to come to the ED whenever she likes to have the dialysis.  Unfortunately, that is what she plans to do.  Our plan is to discharge her tomorrow morning.   Liver cirrhosis History of EtOH abuse, ascites Abdominal pain S/p paracentesis 1/9, 675 mL fluid drained, And paracentesis 1/16, only 1.5 L drained, Repeat  CT >small ascites, cirrhosis, right lateral abdominal wall hematoma Heparin  discontinued .   Acute blood loss anemia secondary to right lateral abdominal wall hematoma Due to fall.  Hemoglobin dropped to 6.9 on 04/10/2024, she received 1 unit of PRBC transfusion.  Hemoglobin over 8 ever since.  Off  heparin  SQ-continue supportive care.  Repeat hemoglobin today.   Transient chest pain Resolved-ACS ruled out.  Echo with preserved ejection fraction, global hypokinesis.   Severe COPD/chronic respiratory failure Continue current inhalers and supplemental O2 as needed She told me that she does not use oxygen  all the night. Only as needed    Hypertension Continue amlodipine .  She is not on Coreg .  Blood pressure is controlled.   Anxiety/depression Continue BuSpar  Continue  xanax  0.25 mg twice daily prn    Chronic pain/neuropathy Continue gabapentin   Acute on chronic hyponatremia Has history of chronic hyponatremia, however range varies anywhere from 125-134.  Due to volume overload. Sodium level improving. It improved from 121 to 126 but then dropped to 122 2 days ago.  Repeating BMP today. Expect to improve with continue HD fluid removal    Vaginal Ulcers patient stated that she has vaginal ulcers and requested to be examined by Gyn  Gynecology consult placed. It seem that Gyn never saw her. But  her symptoms seem to have improved   Constipation KUB showed 1.  Constipation. 2. Nonobstructive bowel gas pattern. Continue Miralax  and Colace   DVT prophylaxis: SCDs Start: 03/24/24 9380   Code Status: Limited: Do not attempt resuscitation  (DNR) -DNR-LIMITED -Do Not Intubate/DNI   Family Communication:  None present at bedside.  Plan of care discussed with patient in length and he/she verbalized understanding and agreed with it.  Status is: Inpatient Remains inpatient appropriate because: Needs dialysis today   Estimated body mass index is 22.13 kg/m as calculated from the following:   Height as of this encounter: 5' 7 (1.702 m).   Weight as of this encounter: 64.1 kg.    Nutritional Assessment: Body mass index is 22.13 kg/m.SABRA Seen by dietician.  I agree with the assessment and plan as outlined below: Nutrition Status:        . Skin Assessment: I have examined the patient's skin and I agree with the wound assessment as performed by the wound care RN as outlined below:    Consultants:  Nephrology  Procedures:  None  Antimicrobials:  Anti-infectives (From admission, onward)    None         Subjective: Seen and examined, she has no complaints.  She tells me that she plans to live with her ex after discharge.  Objective: Vitals:   04/14/24 0757 04/14/24 0849 04/14/24 0850 04/14/24 1133  BP: 137/64   (!) 136/56  Pulse: 80   71  Resp:      Temp: 98.1 F (36.7 C)   98.1 F (36.7 C)  TempSrc:      SpO2: 96% 95% 96% 91%  Weight:      Height:        Intake/Output Summary (Last 24 hours) at 04/14/2024 1335 Last data filed at 04/14/2024 0800 Gross per 24 hour  Intake 696 ml  Output --  Net 696 ml   Filed Weights   04/12/24 1403 04/12/24 1855 04/14/24 0456  Weight: 64.2 kg 59.2 kg 64.1 kg    Examination:  General exam: Appears calm and comfortable  Respiratory system: Clear to auscultation. Respiratory effort normal. Cardiovascular system: S1 & S2 heard, RRR. No JVD, murmurs, rubs, gallops or clicks. No pedal edema. Gastrointestinal system: Abdomen is nondistended, soft and nontender. No organomegaly or masses felt. Normal bowel sounds heard. Central nervous system: Alert and oriented. No  focal neurological deficits. Extremities: Symmetric 5 x 5 power. Skin: No rashes, lesions or ulcers  Data Reviewed: I have personally reviewed following labs and imaging studies  CBC: Recent Labs  Lab 04/08/24 0218 04/10/24 0856 04/11/24 1041 04/12/24 0212  WBC 5.6 9.2 8.3 9.2  HGB 7.2* 6.9* 8.2* 8.2*  HCT 22.5* 22.0* 25.4* 25.8*  MCV 98.3 101.4* 96.2 97.0  PLT 205 243 221 240   Basic Metabolic Panel: Recent Labs  Lab 04/08/24 0218 04/09/24 0258 04/10/24 0856 04/10/24 1548 04/12/24 1157  NA 127* 124* 121* 126* 122*  K 4.4 5.3* 5.7* 4.4 4.9  CL 88* 87* 85* 90* 83*  CO2 28 28 26 24 28   GLUCOSE 96 92 113* 107* 91  BUN 24* 34* 45* 21 41*  CREATININE 2.45* 3.55* 3.89* 2.28* 3.57*  CALCIUM  8.4* 8.7* 8.4* 8.4* 8.4*  PHOS  --   --   --   --  4.1   GFR: Estimated Creatinine Clearance: 15.7 mL/min (A) (by C-G formula based on SCr of 3.57 mg/dL (H)). Liver Function Tests: Recent Labs  Lab 04/12/24 1157  ALBUMIN  3.7   No results for input(s): LIPASE, AMYLASE in the last 168 hours. No results for input(s): AMMONIA in the last 168 hours. Coagulation Profile: No results for input(s): INR, PROTIME in the last 168 hours. Cardiac Enzymes: No results for input(s): CKTOTAL, CKMB, CKMBINDEX, TROPONINI in the last 168 hours. BNP (last 3 results) Recent Labs    03/10/24 0055 03/13/24 2100 03/23/24 1850  PROBNP >35,000.0* >35,000.0* >35,000.0*   HbA1C: No results for input(s): HGBA1C in the last 72 hours. CBG: Recent Labs  Lab 04/09/24 1959 04/10/24 0517 04/10/24 0824 04/10/24 1419  GLUCAP 98 104* 120* 115*   Lipid Profile: No results for input(s): CHOL, HDL, LDLCALC, TRIG, CHOLHDL, LDLDIRECT in the last 72 hours. Thyroid Function Tests: No results for input(s): TSH, T4TOTAL, FREET4, T3FREE, THYROIDAB in the last 72 hours. Anemia Panel: No results for input(s): VITAMINB12, FOLATE, FERRITIN, TIBC, IRON , RETICCTPCT  in the last 72 hours. Sepsis Labs: Recent Labs  Lab 04/10/24 0525  PROCALCITON 0.22    No results found for this or any previous visit (from the past 240 hours).   Radiology Studies: No results found.  Scheduled Meds:  sodium chloride    Intravenous Once   sodium chloride    Intravenous Once   amLODipine   2.5 mg Oral BID   atorvastatin   10 mg Oral Daily   budesonide -glycopyrrolate -formoterol   2 puff Inhalation BID   busPIRone   5 mg Oral BID   calcitRIOL   0.25 mcg Oral Daily   Chlorhexidine  Gluconate Cloth  6 each Topical Q0600   Chlorhexidine  Gluconate Cloth  6 each Topical Q0600   Chlorhexidine  Gluconate Cloth  6 each Topical Q0600   Chlorhexidine  Gluconate Cloth  6 each Topical Q0600   darbepoetin (ARANESP ) injection - DIALYSIS  150 mcg Subcutaneous Q Wed-1800   docusate sodium   100 mg Oral BID   folic acid   1 mg Oral Daily   gabapentin   100 mg Oral QHS   multivitamin with minerals  1 tablet Oral Daily   nicotine   14 mg Transdermal Daily   pantoprazole   40 mg Oral BID   polyethylene glycol  17 g Oral Daily   thiamine   100 mg Oral Daily   torsemide   100 mg Oral Daily   Continuous Infusions:   LOS: 21 days   Fredia Skeeter, MD [facility]  04/14/2024, 1:35 PM   *Please note that this is a verbal dictation therefore any spelling or grammatical errors are due to the Dragon Medical One system interpretation.  Please page via Amion and do not message via secure chat for urgent patient care matters. Secure chat can be used for non urgent patient care matters.  How to contact the [facility] Attending or Consulting provider 7A - 7P or covering provider during after hours 7P -7A, for this patient?  Check the care team in [facility] and look for a) attending/consulting [facility] provider listed and b) the [facility] team listed. Page or secure chat 7A-7P. Log into [email or web address] and use [facility] universal password to access. If you do not have the password, please contact the hospital  operator. Locate the [facility] provider you are looking for under [facility] and page to a number that you can be directly reached. If you still have difficulty reaching the provider, please page the [facility] (Director on Call) for the Hospitalists listed on amion for assistance.  "

## 2024-04-14 NOTE — TOC Progression Note (Signed)
 Transition of Care [facility]) - Progression Note    Patient Details  Name: [name] MRN: [id number] Date of Birth: 1960-10-01  [facility]) CM/SW Contact  Luann SHAUNNA [place], [place] Phone Number: 04/14/2024, 12:26 PM  Clinical Narrative:     Per RN yesterday pt wanting to speak with financial counseling and requesting transportation assistance. CSW met with pt this morning. She was already able to speak with Rojelio at financial counseling this morning; she has too much income to qualify for medicaid. Pt also states she needs a taxi voucher to get home on DC; can be provided on day of DC. Pt plans to stay with her ex at [address] [place] [address]. CSW explained to pt that nebulizer would be private pay; pt was aware and had spoken to DME rep previously. She is wanting to speak with DME rep about a payment plan as she gets paid tomorrow. RNCM to notify DME rep to call pt on room phone to discuss nebulizer cost/payment options.    Expected Discharge Plan:  (TBD) Barriers to Discharge: Continued Medical Work up               Expected Discharge Plan and Services In-house Referral: Clinical Social Work Discharge Planning Services: CM Consult   Living arrangements for the past 2 months: Hotel/Motel, Homeless                 DME Arranged: Community education officer DME Agency: [employer] Date DME Agency Contacted: 04/13/24 Time DME Agency Contacted: [phone] Representative spoke with at DME Agency: Zack             Social Drivers of Health (SDOH) Interventions SDOH Screenings   Food Insecurity: Food Insecurity Present (03/10/2024)  Housing: High Risk (03/26/2024)  Transportation Needs: Unmet Transportation Needs (03/26/2024)  Utilities: At Risk (03/26/2024)  Alcohol  Screen: Low Risk (02/10/2023)  Depression (PHQ2-9): Medium Risk (06/11/2023)  Financial Resource Strain: High Risk (02/10/2023)  Physical Activity: Inactive (02/10/2023)  Social Connections: Socially Isolated  (03/10/2024)  Tobacco Use: High Risk (03/23/2024)  Health Literacy: Adequate Health Literacy (02/10/2023)    Readmission Risk Interventions    01/26/2024    4:49 PM 01/14/2024    9:38 AM 07/01/2023   12:39 PM  Readmission Risk Prevention Plan  Transportation Screening Complete Complete Complete  Medication Review (RN Care Manager) Complete Referral to Pharmacy Referral to Pharmacy  PCP or Specialist appointment within 3-5 days of discharge  Complete Complete  HRI or Home Care Consult Complete Complete Complete  SW Recovery Care/Counseling Consult  Complete Complete  Palliative Care Screening Not Applicable Not Applicable Not Applicable  Skilled Nursing Facility Not Applicable Not Applicable Not Applicable

## 2024-04-14 NOTE — Progress Notes (Addendum)
 Patient c/o SOB, chest pressure, and epigastric pain 8/10. Patient reports pain started after eating an omelette with peppers. Had one episode of emesis.  Gave PRN Maalox and zofran , nausea resolved. Patient continued to c/o pain 8/10 and SOB. EKG obtained and in the chart, gave PRN oxycodone  and duoneb.  Notified Pahwani, MD. No new orders. Also updated Melia, MD with nephrology. Patient is on the schedule for HD.  BP 145/66, HR 78, SpO2 96 % on 3L [place], RR 20

## 2024-04-14 NOTE — Progress Notes (Signed)
 " [facility] Progress Note   Subjective:    Seen in room, feels much better, walking the hallways. States she is able to go home, ex willing to take her at least temporarily for $500. Tolerated HD yest with no issues. Chronic cough.   Objective Vitals:   04/14/24 0456 04/14/24 0757 04/14/24 0849 04/14/24 0850  BP: (!) 136/55 137/64    Pulse: 73 80    Resp: 18     Temp: 98.1 F (36.7 C) 98.1 F (36.7 C)    TempSrc: Oral     SpO2: 95% 96% 95% 96%  Weight: 64.1 kg     Height:       Physical Exam General: chronically ill appearing, 4 L [place] O2 Heart: RRR Lungs: coarse breath sounds, normal wob Abdomen: distended 1-2+  Extremities: tr bilat LE edema Dialysis Access: Rt BBT +b    Dialysis Orders: from 2024 -->  3h  B350 Heparin  none  RUA AVF No dialysis center currently    Assessment/Plan  ESRD - does not have a regular HD center - when dc'd/ or leaves AMA, pt knows to come to ED for hospital HD -> trying to keep her MWF - has been completely noncompliant with outpt HD, thus has no OP unit - in hospital we are on MWF schedule; tolerated tx Mon w 5L UF - next HD today but unfortunately 3rd shift bec of hosp census  Volume overload, AHRF - CXR 1/14 +pulm edema - resp better, however still vol overloaded  - due to over-drinking liquids/ ice - asked RNs to be signs in the room re: fluid restriction 1200 cc/d - attempting to manage with HD - can usually tolerate high UF 4-5 L  - came in 70-73kg, now is to 57- 63kg  # hyperkalemia - better after tx  Agitation/ AMS - required CIWA protocol last week - stable now   Liver cirrhosis, Ascites - s/p paracentesis 0.67 L on 1/9 - s/p paracentesis 1.5 L on 1/16  Chronic hyponatremia - same issues as above, due to vol overload in esrd pt  - changed back to renal diet w/ 1200 cc FR - poor prognosis   Chronic homelessness  Anemia of CKD - Hgb 7.0, Venofer  x 2 ordered, ESA started 1/14 and dose  increased - Iron  replete - Hb 6.9 , got 2u prbc's w/ HD Sat   HTN - bp's soft Sunday at 105/ 70 - dc'd coreg  and dropped norvasc  to 2.5 bid w/ hold orders yesterday - watch bp's   Nutrition - changed back to renal diet  - fluid restriction .  COPD - continue home meds  GOC  - pt is DNR-Limited - very poor prognosis w/ multi organ failure and no social support    Recent Labs  Lab 04/10/24 1548 04/11/24 1041 04/12/24 0212 04/12/24 1157  HGB  --  8.2* 8.2*  --   ALBUMIN   --   --   --  3.7  CALCIUM  8.4*  --   --  8.4*  PHOS  --   --   --  4.1  CREATININE 2.28*  --   --  3.57*  K 4.4  --   --  4.9    Inpatient medications:  sodium chloride    Intravenous Once   sodium chloride    Intravenous Once   amLODipine   2.5 mg Oral BID   atorvastatin   10 mg Oral Daily   budesonide -glycopyrrolate -formoterol   2 puff Inhalation BID   busPIRone   5 mg Oral BID   calcitRIOL   0.25 mcg Oral Daily   Chlorhexidine  Gluconate Cloth  6 each Topical Q0600   Chlorhexidine  Gluconate Cloth  6 each Topical Q0600   Chlorhexidine  Gluconate Cloth  6 each Topical Q0600   Chlorhexidine  Gluconate Cloth  6 each Topical Q0600   darbepoetin (ARANESP ) injection - DIALYSIS  150 mcg Subcutaneous Q Wed-1800   docusate sodium   100 mg Oral BID   folic acid   1 mg Oral Daily   gabapentin   100 mg Oral QHS   multivitamin with minerals  1 tablet Oral Daily   nicotine   14 mg Transdermal Daily   pantoprazole   40 mg Oral BID   polyethylene glycol  17 g Oral Daily   thiamine   100 mg Oral Daily   torsemide   100 mg Oral Daily       acetaminophen  **OR** acetaminophen , ALPRAZolam , alum & mag hydroxide-simeth, ipratropium-albuterol , melatonin, ondansetron  (ZOFRAN ) IV, mouth rinse, oxyCODONE          "

## 2024-04-15 ENCOUNTER — Other Ambulatory Visit (HOSPITAL_COMMUNITY): Payer: Self-pay

## 2024-04-15 MED ORDER — BUSPIRONE HCL 5 MG PO TABS: 5.0000 mg | ORAL_TABLET | Freq: Two times a day (BID) | ORAL | 0 refills | Status: DC

## 2024-04-15 MED ORDER — AMLODIPINE BESYLATE 2.5 MG PO TABS
2.5000 mg | ORAL_TABLET | Freq: Two times a day (BID) | ORAL | 0 refills | Status: AC
  Filled 2024-04-15: qty 30, 15d supply, fill #0

## 2024-04-15 MED ORDER — BUSPIRONE HCL 5 MG PO TABS
5.0000 mg | ORAL_TABLET | Freq: Two times a day (BID) | ORAL | 0 refills | Status: AC
  Filled 2024-04-15: qty 60, 30d supply, fill #0

## 2024-04-15 MED ORDER — AMLODIPINE BESYLATE 2.5 MG PO TABS: 2.5000 mg | ORAL_TABLET | Freq: Two times a day (BID) | ORAL | 0 refills | Status: DC

## 2024-04-15 MED ORDER — TRAZODONE HCL 50 MG PO TABS
50.0000 mg | ORAL_TABLET | Freq: Every evening | ORAL | Status: DC | PRN
  Administered 2024-04-15: 50 mg via ORAL
  Filled 2024-04-15: qty 1

## 2024-04-15 NOTE — Progress Notes (Signed)
" °   04/15/24 0045  Vitals  Temp 97.7 F (36.5 C)  Temp Source Oral  BP 136/81  MAP (mmHg) 98  BP Location Left Arm  BP Method Automatic  Patient Position (if appropriate) Lying  Pulse Rate 78  ECG Heart Rate 77  Resp 11  Oxygen  Therapy  SpO2 97 %  O2 Device Room Air  During Treatment Monitoring  Blood Flow Rate (mL/min) 199 mL/min  Arterial Pressure (mmHg) -107.06 mmHg  Venous Pressure (mmHg) 139.79 mmHg  TMP (mmHg) 19.59 mmHg  Ultrafiltration Rate (mL/min) 869 mL/min  Dialysate Flow Rate (mL/min) 300 ml/min  Dialysate Potassium Concentration 2  Dialysate Calcium  Concentration 2.5  Duration of HD Treatment -hour(s) 3.75 hour(s)  Cumulative Fluid Removed (mL) per Treatment  3800.17  HD Safety Checks Performed Yes  Intra-Hemodialysis Comments Tx completed  Post Treatment  Dialyzer Clearance Lightly streaked  Liters Processed 84  Fluid Removed (mL) 3800 mL  Tolerated HD Treatment Yes  AVG/AVF Arterial Site Held (minutes) 10 minutes  AVG/AVF Venous Site Held (minutes) 10 minutes  Fistula / Graft Right Upper arm Arteriovenous fistula  Placement Date/Time: 08/14/22 1230   Orientation: Right  Access Location: (c) Upper arm  Access Type: Arteriovenous fistula  Site Condition No complications  Fistula / Graft Assessment Present;Thrill;Bruit  Status Deaccessed  Needle Size 15  Drainage Description None    "

## 2024-04-15 NOTE — Progress Notes (Signed)
 " [facility] Progress Note   Subjective:   Reported chest pain yesterday, says she thinks it was indigestion. Now resolved. Denies SOB, CP, dizziness. Had HD overnight. Reports she feels ready to discharge.   Objective Vitals:   04/15/24 0045 04/15/24 0147 04/15/24 0530 04/15/24 0739  BP: 136/81 (!) 143/68 (!) 138/58 (!) 121/53  Pulse: 78 87 85 81  Resp: 11 20 19    Temp: 97.7 F (36.5 C) 98 F (36.7 C) 98.1 F (36.7 C) 98.1 F (36.7 C)  TempSrc: Oral     SpO2: 97% 94% 93% 92%  Weight:   61.6 kg   Height:       Physical Exam General: alert female in NAD Heart: RRR, no murmur Lungs: CTA bilaterally, respirations unlabored on RA Abdomen: Soft, + distended, +BS Extremities: trace edema b/l lower extremities Dialysis Access:  RUE AVF + T/b  Additional Objective Labs: Basic Metabolic Panel: Recent Labs  Lab 04/10/24 1548 04/12/24 1157 04/14/24 1957  NA 126* 122* 123*  K 4.4 4.9 5.0  CL 90* 83* 82*  CO2 24 28 25   GLUCOSE 107* 91 101*  BUN 21 41* 44*  CREATININE 2.28* 3.57* 3.69*  CALCIUM  8.4* 8.4* 8.6*  PHOS  --  4.1  --    Liver Function Tests: Recent Labs  Lab 04/12/24 1157  ALBUMIN  3.7   No results for input(s): LIPASE, AMYLASE in the last 168 hours. CBC: Recent Labs  Lab 04/10/24 0856 04/11/24 1041 04/12/24 0212 04/14/24 1957  WBC 9.2 8.3 9.2 6.3  NEUTROABS  --   --   --  4.6  HGB 6.9* 8.2* 8.2* 8.5*  HCT 22.0* 25.4* 25.8* 26.1*  MCV 101.4* 96.2 97.0 95.6  PLT 243 221 240 314   Blood Culture    Component Value Date/Time   SDES PERITONEAL 01/02/2024 1152   SDES PERITONEAL 01/02/2024 1152   SPECREQUEST NONE 01/02/2024 1152   SPECREQUEST NONE 01/02/2024 1152   CULT  01/02/2024 1152    NO GROWTH 5 DAYS Performed at [facility], [address], [place], [place] [address]    REPTSTATUS 01/07/2024 FINAL 01/02/2024 1152   REPTSTATUS 01/02/2024 FINAL 01/02/2024 1152    Cardiac Enzymes: No results for input(s):  CKTOTAL, CKMB, CKMBINDEX, TROPONINI in the last 168 hours. CBG: Recent Labs  Lab 04/09/24 1959 04/10/24 0517 04/10/24 0824 04/10/24 1419  GLUCAP 98 104* 120* 115*   Iron  Studies: No results for input(s): IRON , TIBC, TRANSFERRIN, FERRITIN in the last 72 hours. @lablastinr3 @ Studies/Results: No results found. Medications:   sodium chloride    Intravenous Once   sodium chloride    Intravenous Once   amLODipine   2.5 mg Oral BID   atorvastatin   10 mg Oral Daily   budesonide -glycopyrrolate -formoterol   2 puff Inhalation BID   busPIRone   5 mg Oral BID   calcitRIOL   0.25 mcg Oral Daily   Chlorhexidine  Gluconate Cloth  6 each Topical Q0600   Chlorhexidine  Gluconate Cloth  6 each Topical Q0600   Chlorhexidine  Gluconate Cloth  6 each Topical Q0600   Chlorhexidine  Gluconate Cloth  6 each Topical Q0600   darbepoetin (ARANESP ) injection - DIALYSIS  150 mcg Subcutaneous Q Wed-1800   docusate sodium   100 mg Oral BID   folic acid   1 mg Oral Daily   gabapentin   100 mg Oral QHS   multivitamin with minerals  1 tablet Oral Daily   nicotine   14 mg Transdermal Daily   pantoprazole   40 mg Oral BID   polyethylene  glycol  17 g Oral Daily   thiamine   100 mg Oral Daily   torsemide   100 mg Oral Daily    Assessment/Plan: ESRD - does not have a regular HD center - when dc'd/ or leaves AMA, pt knows to come to ED for hospital HD -> trying to keep her MWF - has been completely noncompliant with outpt HD, thus has no OP unit   Volume overload, AHRF - CXR 1/14 +pulm edema, now improved - continue to reinforce fluid restrictions - can usually tolerate high UF 4-5 L  - came in 70-73kg, now is to 57- 63kg   # hyperkalemia - resolved   Agitation/ AMS - required CIWA protocol last week - stable now    Liver cirrhosis, Ascites - s/p paracentesis 0.67 L on 1/9 - s/p paracentesis 1.5 L on 1/16   Chronic hyponatremia - same issues as above, due to vol overload in esrd pt  - changed  back to renal diet w/ 1200 cc FR   Anemia of CKD - Venofer  x 2 ordered, ESA started 1/14 and dose increased - Iron  replete - got 2u prbc's w/ HD Sat  -Hgb now 8.5   HTN - bp's soft Sunday at 105/ 70 - dc'd coreg  and dropped norvasc  to 2.5 bid w/ hold orders yesterday - improved today   Nutrition - changed back to renal diet  - fluid restriction .   COPD - continue home meds   GOC  - pt is DNR-Limited - very poor prognosis w/ multi organ failure and no social support   Lucie Collet, PA-C 04/15/2024, 9:59 AM  [facility] Pager: [phone]   "

## 2024-04-15 NOTE — Discharge Summary (Signed)
 Physician Discharge Summary  [name] FMW:[id number] DOB: [birth year] DOA: 03/23/2024  PCP: Pcp, No  Admit date: 03/23/2024 Discharge date: 04/15/2024 30 Day Unplanned Readmission Risk Score    Flowsheet Row ED to Hosp-Admission (Current) from 03/23/2024 in [facility] GENERAL MED/SURG UNIT  30 Day Unplanned Readmission Risk Score (%) 81.62 Filed at 04/15/2024 0801    This score is the patient's risk of an unplanned readmission within 30 days of being discharged (0 -100%). The score is based on dignosis, age, lab data, medications, orders, and past utilization.   Low:  0-14.9   Medium: 15-21.9   High: 22-29.9   Extreme: 30 and above          Admitted From: Home Disposition: Home  Recommendations for Outpatient Follow-up:  Follow up with PCP in 1-2 weeks Please obtain BMP/CBC in one week Please follow up with your PCP on the following pending results: Unresulted Labs (From admission, onward)    None         Home Health: None  Equipment/Devices: None  Discharge Condition: Stable CODE STATUS: DNR Diet recommendation:  Diet Order             Diet renal with fluid restriction Fluid restriction: Other (see comments); Room service appropriate? Yes; Fluid consistency: Thin  Diet effective now                   Subjective: Seen and examined, no complaints.  She wants to go home today.  Brief/Interim Summary: 64 y/o female with hx of  COPD supposed to be on home oxygen , ESRD on HD, noncompliance due to homelessness, polysubstance abuse, diastolic CHF, hypertension, dyslipidemia, anxiety, cirrhosis, ascites who presented to ED with chest pain and shortness of breath. Admitted, started on hemodialysis, volume status improving -On 1/13 -14 noted to have worsening agitation and confusion, ABG with severe respiratory acidosis, placed on BiPAP, chest x-ray with worsening pulmonary edema She has been refusing  HD Rx even in the hospital and cutting HD short On  1/16,  respiratory status improving, increased abdominal distention, paracentesis-1.5 L of clear blood-tinged peritoneal fluid drained -1/17 sustained a fall - 1/17, complaining of increased abdominal pain despite paracentesis On  CT 1/18: Right lateral abdominal wall hematoma   Acute on chronic hypoxic and hypercarbic respiratory failure/acute on chronic diastolic congestive heart failure ESRD on HD, volume overload, pulmonary edema Metabolic encephalopathy Noncompliance with HD Volume overload due to pulmonary edema and pleural effusions on repeat x-ray. She had HD and paracentesis. She is improving. She does not complaint of sob. Still has oxygen  supplement with her but she stated that she does not need it.  S/P paracentesis 1/16 Noted to be noncompliance to HD  Inpatient schedule MWF.  Received her dialysis yesterday.  Patient is very noncompliant, tends to come to the ED whenever she wants to have dialysis.  Efforts have been made and we have counseled patient multiple times about compliance.   Liver cirrhosis History of EtOH abuse, ascites Abdominal pain S/p paracentesis 1/9, 675 mL fluid drained, And paracentesis 1/16, only 1.5 L drained, Repeat  CT >small ascites, cirrhosis, right lateral abdominal wall hematoma Heparin  discontinued .   Acute blood loss anemia secondary to right lateral abdominal wall hematoma Due to fall.  Hemoglobin dropped to 6.9 on 04/10/2024, she received 1 unit of PRBC transfusion.  Hemoglobin over 8 ever since.  Off  heparin  SQ-continue supportive care.  Latest hemoglobin 8.5 on 04/14/2024.   Transient chest pain Resolved-ACS  ruled out.  Echo with preserved ejection fraction, global hypokinesis.   Severe COPD/chronic respiratory failure Continue current inhalers and supplemental O2 as needed She told me that she does not use oxygen  all the night. Only as needed    Hypertension PTA medication included amlodipine  10 mg and Coreg  6.25 mg p.o. twice daily.  Unsure  how much she was taking.  She was given only amlodipine  2.5 mg.,  Coreg  was held.  Blood pressure remained very well-controlled so discharging on low-dose amlodipine  only.     Anxiety/depression Resume PTA medications.   Chronic pain/neuropathy Continue gabapentin    Acute on chronic hyponatremia Has history of chronic hyponatremia, however range varies anywhere from 125-134.  Due to volume overload. Sodium level improving. It improved from 121 to 126 but then dropped to 123.  Patient remains asymptomatic. Expect to improve with continue HD fluid removal    Vaginal Ulcers patient stated that she has vaginal ulcers and requested to be examined by Gyn  Gynecology consult placed. It seem that Gyn never saw her. But her symptoms seem to have improved    Discharge Diagnoses:  Principal Problem:   Volume overload Active Problems:   CHF exacerbation (HCC)   ESRD on dialysis (HCC)   Hyperkalemia   COPD (chronic obstructive pulmonary disease) (HCC)    Discharge Instructions   Allergies as of 04/15/2024       Reactions   Zestril  [lisinopril ] Other (See Comments)   Hyperkalemia        Medication List     STOP taking these medications    carvedilol  6.25 MG tablet Commonly known as: COREG        TAKE these medications    amLODipine  2.5 MG tablet Commonly known as: NORVASC  Take 1 tablet (2.5 mg total) by mouth 2 (two) times daily. What changed:  medication strength how much to take when to take this   atorvastatin  10 MG tablet Commonly known as: LIPITOR Take 1 tablet (10 mg total) by mouth daily.   busPIRone  5 MG tablet Commonly known as: BUSPAR  Take 1 tablet (5 mg total) by mouth 2 (two) times daily. What changed: when to take this   calcitRIOL  0.25 MCG capsule Commonly known as: ROCALTROL  Take 1 capsule (0.25 mcg total) by mouth daily.   gabapentin  100 MG capsule Commonly known as: NEURONTIN  Take 1 capsule (100 mg total) by mouth 3 (three) times daily.    pantoprazole  40 MG tablet Commonly known as: PROTONIX  Take 1 tablet (40 mg total) by mouth 2 (two) times daily.   sodium bicarbonate  650 MG tablet Take 2 tablets (1,300 mg total) by mouth 2 (two) times daily.   torsemide  100 MG tablet Commonly known as: DEMADEX  Take 1 tablet (100 mg total) by mouth daily.   Trelegy Ellipta  100-62.5-25 MCG/ACT Aepb Generic drug: Fluticasone -Umeclidin-Vilant Inhale 1 puff into the lungs daily.   Ventolin  HFA 108 (90 Base) MCG/ACT inhaler Generic drug: albuterol  Inhale 2 puffs into the lungs every 6 (six) hours as needed for wheezing or shortness of breath.               Durable Medical Equipment  (From admission, onward)           Start     Ordered   04/13/24 1504  For home use only DME Nebulizer machine  Once       Question Answer Comment  Patient needs a nebulizer to treat with the following condition Respiratory failure (HCC)   Length of Need Lifetime  Additional equipment included Administration kit      04/13/24 1505            Follow-up Information     PCP Follow up in 1 week(s).                 Allergies[1]  Consultations: Nephrology   Procedures/Studies: DG Abd 1 View Result Date: 04/10/2024 EXAM: 1 VIEW XRAY OF THE ABDOMEN 04/10/2024 04:52:00 PM COMPARISON: None available. CLINICAL HISTORY: 101717 Nausea 101717 Nausea. FINDINGS: BOWEL: Increased stool burden throughout the colon. Nonobstructive bowel gas pattern. SOFT TISSUES: Atherosclerotic plaque. BONES: Degenerative changes of the spine. No acute fracture. IMPRESSION: 1.  Constipation. 2. Nonobstructive bowel gas pattern. Electronically signed by: Morgane Naveau MD 04/10/2024 04:58 PM EST RP Workstation: [id number]   DG Chest Port 1 View Result Date: 04/09/2024 EXAM: 1 VIEW XRAY OF THE CHEST 04/09/2024 09:22:00 PM COMPARISON: Comparison with 04/09/2024. CLINICAL HISTORY: SOB (shortness of breath). FINDINGS: LUNGS AND PLEURA: Bilateral pleural  effusions with basilar atelectasis or consolidation. Mild perihilar infiltrates may represent edema or pneumonia. Similar appearance to previous study. No pneumothorax. HEART AND MEDIASTINUM: Cardiac enlargement. Mediastinal contours appear intact. Calcification of the aorta. BONES AND SOFT TISSUES: No acute osseous abnormality. IMPRESSION: 1. Mild perihilar infiltrates, which may represent edema or pneumonia. 2. Bilateral pleural effusions with basilar atelectasis or consolidation, similar to previous study. 3. Cardiac enlargement. Electronically signed by: Elsie Gravely MD 04/09/2024 09:29 PM EST RP Workstation: [id number]   DG CHEST PORT 1 VIEW Result Date: 04/09/2024 CLINICAL DATA:  Shortness of breath and chest pain. EXAM: PORTABLE CHEST 1 VIEW COMPARISON:  03/31/2024 FINDINGS: The cardio pericardial silhouette is enlarged. Diffuse interstitial pulmonary edema pattern. Similar basilar atelectasis or infiltrate with small bilateral pleural effusions. Telemetry leads overlie the chest. IMPRESSION: No substantial change. Interstitial pulmonary edema pattern with bibasilar atelectasis or infiltrate and small bilateral pleural effusions. Electronically Signed   By: Camellia Candle M.D.   On: 04/09/2024 06:27   CT ABDOMEN PELVIS W CONTRAST Result Date: 04/04/2024 CLINICAL DATA:  Abdominal pain with EXAM: CT ABDOMEN AND PELVIS WITH CONTRAST TECHNIQUE: Multidetector CT imaging of the abdomen and pelvis was performed using the standard protocol following bolus administration of intravenous contrast. RADIATION DOSE REDUCTION: This exam was performed according to the departmental dose-optimization program which includes automated exposure control, adjustment of the mA and/or kV according to patient size and/or use of iterative reconstruction technique. CONTRAST:  75mL OMNIPAQUE  IOHEXOL  350 MG/ML SOLN COMPARISON:  CT abdomen pelvis dated 01/06/2024. FINDINGS: Lower chest: Partially visualized moderate left pleural  effusion with partial compressive atelectasis of the left lower lobe relatively similar or slightly progressed since the prior CT. Pneumonia is not excluded. Additional right lung base subsegmental consolidation may represent atelectasis or infiltrate. No intra-abdominal free air.  Small ascites. Hepatobiliary: There is slight irregularity of the liver contour in keeping with cirrhosis. No acutely ductal dilatation. The gallbladder is unremarkable. Pancreas: Unremarkable. No pancreatic ductal dilatation or surrounding inflammatory changes. Spleen: Normal in size without focal abnormality. Adrenals/Urinary Tract: The adrenal glands unremarkable. Moderate bilateral renal parenchyma atrophy. The right kidney is located in the inferior abdomen. There is no hydronephrosis on either side. The urinary bladder is minimally distended and grossly unremarkable. Stomach/Bowel: There is no bowel obstruction. The appendix is normal. Vascular/Lymphatic: Advanced aortoiliac atherosclerotic disease. The IVC is unremarkable. No portal venous gas. There is no adenopathy. Reproductive: The uterus is grossly under. No suspicious adnexal masses. Other: Diffuse abdominal wall edema and anasarca. Indeterminate ill-defined ovoid  high attenuating area in the right lateral abdominal wall musculature (51/13 and coronal 57/6) measuring approximately 6.5 x 3.0 cm. This is not characterized but may represent an intramuscular hematoma. Attention on follow-up imaging recommended. Musculoskeletal: Osteopenia with degenerative changes. No acute osseous pathology. Right femoral head avascular necrosis. No cortical collapse. IMPRESSION: 1. No bowel obstruction. Normal appendix. 2. Cirrhosis with small ascites and anasarca, worsened since the prior CT. 3. Partially visualized moderate left pleural effusion with partial compressive atelectasis of the left lower lobe. Pneumonia is not excluded. 4. Indeterminate ill-defined ovoid high attenuating area in  the right lateral abdominal wall musculature may represent an intramuscular hematoma. Attention on follow-up imaging recommended. 5.  Aortic Atherosclerosis (ICD10-I70.0). Electronically Signed   By: Vanetta Chou M.D.   On: 04/04/2024 17:30   DG HIPS BILAT WITH PELVIS 2V Result Date: 04/03/2024 EXAM: 2 VIEW(S) XRAY OF THE BILATERAL HIP 04/03/2024 07:55:58 PM COMPARISON: None available. CLINICAL HISTORY: Hip pain. FINDINGS: BONES AND JOINTS: No acute fracture. No malalignment. LUMBAR SPINE: Degenerative changes of the visualized lower lumbar spine. SOFT TISSUES: Severe atherosclerotic calcifications. IMPRESSION: 1. No acute findings. Electronically signed by: Franky Stanford MD 04/03/2024 08:23 PM EST RP Workstation: [id number]   CT HEAD WO CONTRAST ( ) Result Date: 04/03/2024 EXAM: CT HEAD WITHOUT CONTRAST 04/03/2024 06:18:54 PM TECHNIQUE: CT of the head was performed without the administration of intravenous contrast. Automated exposure control, iterative reconstruction, and/or weight based adjustment of the mA/kV was utilized to reduce the radiation dose to as low as reasonably achievable. COMPARISON: None available. CLINICAL HISTORY: Polytrauma, blunt. FINDINGS: BRAIN AND VENTRICLES: No acute hemorrhage. No evidence of acute infarct. No hydrocephalus. No extra-axial collection. No mass effect or midline shift. ORBITS: No acute abnormality. SINUSES: No acute abnormality. SOFT TISSUES AND SKULL: Bilateral mastoid effusions. No acute soft tissue abnormality. No skull fracture. IMPRESSION: 1. No acute intracranial abnormality. 2. Bilateral mastoid effusions. Electronically signed by: Franky Stanford MD 04/03/2024 08:16 PM EST RP Workstation: [id number]   IR Paracentesis Result Date: 04/02/2024 INDICATION: 64 year old female with history of half path 4/ESRD, with volume overload, and recurrent ascites. IR was requested for therapeutic paracentesis. EXAM: ULTRASOUND GUIDED THERAPEUTIC PARACENTESIS MEDICATIONS:  4 mL of 1% lidocaine . COMPLICATIONS: None immediate. PROCEDURE: Informed written consent was obtained from the patient after a discussion of the risks, benefits and alternatives to treatment. A timeout was performed prior to the initiation of the procedure. Initial ultrasound scanning demonstrates a moderate amount of ascites within the right lower abdominal quadrant. The right lower abdomen was prepped and draped in the usual sterile fashion. 1% lidocaine  was used for local anesthesia. Following this, a 6 Fr Safe-T-Centesis catheter was introduced. An ultrasound image was saved for documentation purposes. The paracentesis was performed. The catheter was removed and a dressing was applied. The patient tolerated the procedure well without immediate post procedural complication. FINDINGS: A total of approximately 1.5 L of clear, blood-tinged peritoneal fluid was removed. IMPRESSION: Successful ultrasound-guided paracentesis yielding 1.5 liters of peritoneal fluid. Procedure performed Carlin Griffon, PA-C, under the direct supervision of Dr. Cordella Banner, MD. Electronically Signed   By: Cordella Banner   On: 04/02/2024 17:03   DG CHEST PORT 1 VIEW Result Date: 03/31/2024 EXAM: 1 VIEW(S) XRAY OF THE CHEST 03/31/2024 06:42:00 AM COMPARISON: 03/30/2024 CLINICAL HISTORY: The patient presents with volume excess. FINDINGS: LUNGS AND PLEURA: No focal pulmonary opacity. Decreased small left pleural effusion. Similar small right pleural effusion. Increased interstitial markings, consistent with pulmonary edema. No pneumothorax. HEART AND MEDIASTINUM: Cardiomegaly.  BONES AND SOFT TISSUES: No acute osseous abnormality. IMPRESSION: 1. Increased interstitial markings, consistent with pulmonary edema. 2. Decreased left pleural effusion and similar small right pleural effusion. Electronically signed by: Waddell Calk MD 03/31/2024 07:49 AM EST RP Workstation: [id number]   DG Chest Port 1 View Result Date: 03/30/2024 EXAM: 1  VIEW(S) XRAY OF THE CHEST 03/30/2024 08:50:00 PM COMPARISON: 03/23/2024 CLINICAL HISTORY: The patient presents with acute respiratory distress. ICD10 code 33497: Acute respiratory distress. FINDINGS: LUNGS AND PLEURA: Moderate left and small right pleural effusions, similar to prior exam. Stable retrocardiac consolidation. Mild, progressive interstitial pulmonary edema. No pneumothorax. HEART AND MEDIASTINUM: Cardiomegaly. BONES AND SOFT TISSUES: No acute osseous abnormality. IMPRESSION: 1. Mild, progressive interstitial pulmonary edema. 2. Moderate left and small right pleural effusions, similar to prior exam. 3. Stable retrocardiac consolidation. 4. Cardiomegaly. Electronically signed by: Dorethia Molt MD 03/30/2024 08:52 PM EST RP Workstation: [id number]   IR Paracentesis Result Date: 03/26/2024 INDICATION: 64 year old female with history of HFpEF/ESRD with volume overload. She has previously undergone paracentesis (last puncture 01/02/24, 2.8 L output), and increased ascites was noted in US  performed during this admission. IR was requested for therapeutic paracentesis. EXAM: ULTRASOUND GUIDED THERAPEUTIC PARACENTESIS MEDICATIONS: 8 mL 1% lidocaine  with epinephrine  COMPLICATIONS: None immediate. PROCEDURE: Informed written consent was obtained from the patient after a discussion of the risks, benefits and alternatives to treatment. A timeout was performed prior to the initiation of the procedure. Initial ultrasound scanning demonstrates a small to moderate amount of ascites within the right lower abdominal quadrant and moderate amount of ascites left lower quadrant achieved by positioning patient in LPO. The left lower abdomen was prepped and draped in the usual sterile fashion. 1% lidocaine  with epinephrine  was used for local anesthesia. Following this, a 19 gauge, 7-cm, Yueh catheter was introduced. An ultrasound image was saved for documentation purposes. The paracentesis was performed. The catheter was  removed and a dressing was applied. Of note, patient was not fully cooperative during procedure, required constant direction to not change positions. Ideally, patient would have remained in a LPO position throughout the procedure to maximize output, but she was unable to do this, contributing to modest output volume. The patient tolerated the procedure well without immediate post procedural complication. FINDINGS: A total of approximately 675 milliliters of clear, yellow fluid was removed. IMPRESSION: Successful ultrasound-guided paracentesis yielding 675 milliliters of peritoneal fluid. Performed by Laymon Coast, NP under the supervision of Dr. Ester Sides. Electronically Signed   By: Ester Sides M.D.   On: 03/26/2024 14:35   ECHOCARDIOGRAM COMPLETE Result Date: 03/25/2024    ECHOCARDIOGRAM REPORT   Patient Name:   TIFFANI KADOW Haverstock Date of Exam: 03/25/2024 Medical Rec #:  [id number]      Height:       67.0 in Accession #:    [id number]     Weight:       154.8 lb Date of Birth:  12-03-1960      BSA:          1.813 m Patient Age:    63 years       BP:           131/111 mmHg Patient Gender: F              HR:           65 bpm. Exam Location:  Inpatient Procedure: 2D Echo, Cardiac Doppler and Color Doppler (Both Spectral and Color  Flow Doppler were utilized during procedure). Indications:    Elevated Troponin  History:        Patient has prior history of Echocardiogram examinations, most                 recent 10/23/2023. CHF, COPD, Signs/Symptoms:Murmur; Risk                 Factors:Current Smoker, Hypertension, Dyslipidemia and Diabetes.  Sonographer:    Juliene Rucks Referring Phys: [id number] VASUNDHRA RATHORE  Sonographer Comments: Suboptimal parasternal window and Technically difficult study due to poor echo windows. Image acquisition challenging due to patient behavioral factors., Image acquisition challenging due to uncooperative patient, Image acquisition challenging due to respiratory motion and  Image acquisition challenging due to COPD. IMPRESSIONS  1. Left ventricular ejection fraction, by estimation, is 50 to 55%. The left ventricle has low normal function. The left ventricle demonstrates global hypokinesis. Left ventricular diastolic parameters are consistent with Grade I diastolic dysfunction (impaired relaxation).  2. Right ventricular systolic function is normal. The right ventricular size is not well visualized.  3. Moderate pleural effusion in the left lateral region.  4. The mitral valve is grossly normal. Trivial mitral valve regurgitation. No evidence of mitral stenosis. Moderate mitral annular calcification.  5. The aortic valve is calcified. There is moderate calcification of the aortic valve. Aortic valve regurgitation is not visualized. Aortic valve sclerosis is present, with no evidence of aortic valve stenosis. Comparison(s): Prior images reviewed side by side. Difficult images on current study, but visually EF slightly less than prior (though prior EF appears ~55% on personal review. No focal wall motion abnormalities appreciated, though technical limitations reduce sensitivity for regional abnormalities. FINDINGS  Left Ventricle: Left ventricular ejection fraction, by estimation, is 50 to 55%. The left ventricle has low normal function. The left ventricle demonstrates global hypokinesis. The left ventricular internal cavity size was normal in size. There is borderline left ventricular hypertrophy. Left ventricular diastolic parameters are consistent with Grade I diastolic dysfunction (impaired relaxation). Right Ventricle: The right ventricular size is not well visualized. Right vetricular wall thickness was not well visualized. Right ventricular systolic function is normal. Left Atrium: Left atrial size was not well visualized. Right Atrium: Right atrial size was not well visualized. Pericardium: There is no evidence of pericardial effusion. Mitral Valve: The mitral valve is grossly  normal. There is mild thickening of the mitral valve leaflet(s). There is mild calcification of the mitral valve leaflet(s). Moderate mitral annular calcification. Trivial mitral valve regurgitation. No evidence of mitral valve stenosis. Tricuspid Valve: The tricuspid valve is grossly normal. Tricuspid valve regurgitation is trivial. No evidence of tricuspid stenosis. Aortic Valve: The aortic valve is calcified. There is moderate calcification of the aortic valve. Aortic valve regurgitation is not visualized. Aortic valve sclerosis is present, with no evidence of aortic valve stenosis. Pulmonic Valve: The pulmonic valve was not well visualized. Pulmonic valve regurgitation is not visualized. Aorta: The ascending aorta was not well visualized and the aortic root was not well visualized. Venous: The inferior vena cava was not well visualized. IAS/Shunts: The interatrial septum was not well visualized. Additional Comments: There is a moderate pleural effusion in the left lateral region.  LEFT VENTRICLE PLAX 2D LVIDd:         4.52 cm Diastology LVIDs:         3.45 cm LV e' medial:    6.53 cm/s LV PW:         1.20 cm LV E/e' medial:  26.0 LV IVS:        0.98 cm LV e' lateral:   8.70 cm/s                        LV E/e' lateral: 19.5  RIGHT VENTRICLE RV S prime:     11.30 cm/s TAPSE (M-mode): 1.7 cm LEFT ATRIUM           Index        RIGHT ATRIUM           Index LA Vol (A4C): 42.8 ml 23.60 ml/m  RA Area:     13.50 cm                                    RA Volume:   35.20 ml  19.41 ml/m  AORTIC VALVE LVOT Vmax:   141.00 cm/s LVOT Vmean:  93.500 cm/s LVOT VTI:    0.268 m MITRAL VALVE MV Area (PHT): 3.91 cm     SHUNTS MV Decel Time: 194 msec     Systemic VTI: 0.27 m MV E velocity: 170.00 cm/s MV A velocity: 111.00 cm/s MV E/A ratio:  1.53 Shelda Bruckner MD Electronically signed by Shelda Bruckner MD Signature Date/Time: 03/25/2024/2:52:06 PM    Final    US  ASCITES (ABDOMEN LIMITED) Result Date:  03/24/2024 EXAM: LIMITED ABDOMINAL ULTRASOUND FOR ASCITES EVALUATION TECHNIQUE: Limited real-time sonography of all 4 quadrants of the abdomen was performed for evaluation of ascites. COMPARISON: US  Abdomen Limited 01/26/2024. CT abdomen and pelvis 01/06/2024. CLINICAL HISTORY: 64 year old female with ascites, pain. FINDINGS: Simple appearing ascites fluid demonstrated, more pronounced in the left abdomen. Bilateral ascites appears increased from November, volume moderate, and is more pronounced in the left abdomen. IMPRESSION: 1. Bilateral ascites appears increased from November, volume moderate and more pronounced in the left abdomen. Electronically signed by: Helayne Hurst MD 03/24/2024 07:12 AM EST RP Workstation: [id number]   DG Chest 2 View Result Date: 03/23/2024 EXAM: 2 VIEW(S) XRAY OF THE CHEST 03/23/2024 07:11:00 PM COMPARISON: 03/13/2024 CLINICAL HISTORY: shob shob FINDINGS: LUNGS AND PLEURA: Bibasilar opacities, favor atelectasis. Small to moderate bilateral pleural effusions, left larger than right. No pneumothorax. HEART AND MEDIASTINUM: Cardiomegaly, vascular congestion. BONES AND SOFT TISSUES: No acute osseous abnormality. IMPRESSION: 1. Vascular congestion with small to moderate bilateral pleural effusions, left larger than right. 2. Cardiomegaly. 3. Bibasilar opacities, favor atelectasis. Electronically signed by: Franky Crease MD 03/23/2024 07:26 PM EST RP Workstation: [id number]     Discharge Exam: Vitals:   04/15/24 0530 04/15/24 0739  BP: (!) 138/58 (!) 121/53  Pulse: 85 81  Resp: 19   Temp: 98.1 F (36.7 C) 98.1 F (36.7 C)  SpO2: 93% 92%   Vitals:   04/15/24 0045 04/15/24 0147 04/15/24 0530 04/15/24 0739  BP: 136/81 (!) 143/68 (!) 138/58 (!) 121/53  Pulse: 78 87 85 81  Resp: 11 20 19    Temp: 97.7 F (36.5 C) 98 F (36.7 C) 98.1 F (36.7 C) 98.1 F (36.7 C)  TempSrc: Oral     SpO2: 97% 94% 93% 92%  Weight:   61.6 kg   Height:        General: Pt is alert, awake, not  in acute distress Cardiovascular: RRR, S1/S2 +, no rubs, no gallops Respiratory: CTA bilaterally, no wheezing, no rhonchi Abdominal: Soft, NT, ND, bowel sounds + Extremities: no edema, no cyanosis    The results  of significant diagnostics from this hospitalization (including imaging, microbiology, ancillary and laboratory) are listed below for reference.     Microbiology: No results found for this or any previous visit (from the past 240 hours).   Labs: BNP (last 3 results) Recent Labs    10/23/23 0859 12/30/23 1225 01/25/24 1743  BNP 794.8* 1,598.1* 1,059.9*   Basic Metabolic Panel: Recent Labs  Lab 04/09/24 0258 04/10/24 0856 04/10/24 1548 04/12/24 1157 04/14/24 1957  NA 124* 121* 126* 122* 123*  K 5.3* 5.7* 4.4 4.9 5.0  CL 87* 85* 90* 83* 82*  CO2 28 26 24 28 25   GLUCOSE 92 113* 107* 91 101*  BUN 34* 45* 21 41* 44*  CREATININE 3.55* 3.89* 2.28* 3.57* 3.69*  CALCIUM  8.7* 8.4* 8.4* 8.4* 8.6*  PHOS  --   --   --  4.1  --    Liver Function Tests: Recent Labs  Lab 04/12/24 1157  ALBUMIN  3.7   No results for input(s): LIPASE, AMYLASE in the last 168 hours. No results for input(s): AMMONIA in the last 168 hours. CBC: Recent Labs  Lab 04/10/24 0856 04/11/24 1041 04/12/24 0212 04/14/24 1957  WBC 9.2 8.3 9.2 6.3  NEUTROABS  --   --   --  4.6  HGB 6.9* 8.2* 8.2* 8.5*  HCT 22.0* 25.4* 25.8* 26.1*  MCV 101.4* 96.2 97.0 95.6  PLT 243 221 240 314   Cardiac Enzymes: No results for input(s): CKTOTAL, CKMB, CKMBINDEX, TROPONINI in the last 168 hours. BNP: Invalid input(s): POCBNP CBG: Recent Labs  Lab 04/09/24 1959 04/10/24 0517 04/10/24 0824 04/10/24 1419  GLUCAP 98 104* 120* 115*   D-Dimer No results for input(s): DDIMER in the last 72 hours. Hgb A1c No results for input(s): HGBA1C in the last 72 hours. Lipid Profile No results for input(s): CHOL, HDL, LDLCALC, TRIG, CHOLHDL, LDLDIRECT in the last 72 hours. Thyroid  function studies No results for input(s): TSH, T4TOTAL, T3FREE, THYROIDAB in the last 72 hours.  Invalid input(s): FREET3 Anemia work up No results for input(s): VITAMINB12, FOLATE, FERRITIN, TIBC, IRON , RETICCTPCT in the last 72 hours. Urinalysis    Component Value Date/Time   COLORURINE YELLOW 12/30/2023 1305   APPEARANCEUR CLOUDY (A) 12/30/2023 1305   LABSPEC 1.011 12/30/2023 1305   PHURINE 6.0 12/30/2023 1305   GLUCOSEU 50 (A) 12/30/2023 1305   HGBUR SMALL (A) 12/30/2023 1305   BILIRUBINUR NEGATIVE 12/30/2023 1305   KETONESUR NEGATIVE 12/30/2023 1305   PROTEINUR 100 (A) 12/30/2023 1305   UROBILINOGEN 0.2 03/02/2010 2027   NITRITE NEGATIVE 12/30/2023 1305   LEUKOCYTESUR LARGE (A) 12/30/2023 1305   Sepsis Labs Recent Labs  Lab 04/10/24 0856 04/11/24 1041 04/12/24 0212 04/14/24 1957  WBC 9.2 8.3 9.2 6.3   Microbiology No results found for this or any previous visit (from the past 240 hours).  FURTHER DISCHARGE INSTRUCTIONS:   Get Medicines reviewed and adjusted: Please take all your medications with you for your next visit with your Primary MD   Laboratory/radiological data: Please request your Primary MD to go over all hospital tests and procedure/radiological results at the follow up, please ask your Primary MD to get all Hospital records sent to his/her office.   In some cases, they will be blood work, cultures and biopsy results pending at the time of your discharge. Please request that your primary care M.D. goes through all the records of your hospital data and follows up on these results.   Also Note the following: If you experience worsening  of your admission symptoms, develop shortness of breath, life threatening emergency, suicidal or homicidal thoughts you must seek medical attention immediately by calling 911 or calling your MD immediately  if symptoms less severe.   You must read complete instructions/literature along with all the  possible adverse reactions/side effects for all the Medicines you take and that have been prescribed to you. Take any new Medicines after you have completely understood and accpet all the possible adverse reactions/side effects.    patient was instructed, not to drive, operate heavy machinery, perform activities at heights, swimming or participation in water  activities or provide baby-sitting services while on Pain, Sleep and Anxiety Medications; until their outpatient Physician has advised to do so again. Also recommended to not to take more than prescribed Pain, Sleep and Anxiety Medications.  It is not advisable to combine anxiety, sleep and pain medications without talking with your primary care provider.     Wear Seat belts while driving.   Please note: You were cared for by a hospitalist during your hospital stay. Once you are discharged, your primary care physician will handle any further medical issues. Please note that NO REFILLS for any discharge medications will be authorized once you are discharged, as it is imperative that you return to your primary care physician (or establish a relationship with a primary care physician if you do not have one) for your post hospital discharge needs so that they can reassess your need for medications and monitor your lab values  Time coordinating discharge: Over 30 minutes  SIGNED:   Fredia Skeeter, MD  [facility] 04/15/2024, 9:23 AM *Please note that this is a verbal dictation therefore any spelling or grammatical errors are due to the Dragon Medical One system interpretation. If 7PM-7AM, please contact night-coverage [email or web address]     [1]  Allergies Allergen Reactions   Zestril  [Lisinopril ] Other (See Comments)    Hyperkalemia

## 2024-04-15 NOTE — Plan of Care (Signed)

## 2024-04-21 ENCOUNTER — Emergency Department (HOSPITAL_COMMUNITY)
Admission: EM | Admit: 2024-04-21 | Discharge: 2024-04-22 | Disposition: A | Payer: Self-pay | Attending: Emergency Medicine | Admitting: Emergency Medicine

## 2024-04-21 ENCOUNTER — Other Ambulatory Visit: Payer: Self-pay

## 2024-04-21 ENCOUNTER — Emergency Department (HOSPITAL_COMMUNITY): Payer: Self-pay

## 2024-04-21 DIAGNOSIS — I132 Hypertensive heart and chronic kidney disease with heart failure and with stage 5 chronic kidney disease, or end stage renal disease: Secondary | ICD-10-CM | POA: Insufficient documentation

## 2024-04-21 DIAGNOSIS — Z992 Dependence on renal dialysis: Secondary | ICD-10-CM | POA: Insufficient documentation

## 2024-04-21 DIAGNOSIS — Z79899 Other long term (current) drug therapy: Secondary | ICD-10-CM | POA: Insufficient documentation

## 2024-04-21 DIAGNOSIS — J449 Chronic obstructive pulmonary disease, unspecified: Secondary | ICD-10-CM | POA: Insufficient documentation

## 2024-04-21 DIAGNOSIS — R0602 Shortness of breath: Secondary | ICD-10-CM | POA: Insufficient documentation

## 2024-04-21 DIAGNOSIS — Z9981 Dependence on supplemental oxygen: Secondary | ICD-10-CM | POA: Insufficient documentation

## 2024-04-21 DIAGNOSIS — R600 Localized edema: Secondary | ICD-10-CM | POA: Insufficient documentation

## 2024-04-21 DIAGNOSIS — Z5900 Homelessness unspecified: Secondary | ICD-10-CM | POA: Insufficient documentation

## 2024-04-21 DIAGNOSIS — N186 End stage renal disease: Secondary | ICD-10-CM | POA: Insufficient documentation

## 2024-04-21 DIAGNOSIS — I509 Heart failure, unspecified: Secondary | ICD-10-CM | POA: Insufficient documentation

## 2024-04-21 DIAGNOSIS — R0902 Hypoxemia: Secondary | ICD-10-CM | POA: Insufficient documentation

## 2024-04-21 LAB — I-STAT CHEM 8, ED
BUN: 65 mg/dL — ABNORMAL HIGH (ref 8–23)
Calcium, Ion: 0.92 mmol/L — ABNORMAL LOW (ref 1.15–1.40)
Chloride: 93 mmol/L — ABNORMAL LOW (ref 98–111)
Creatinine, Ser: 5.6 mg/dL — ABNORMAL HIGH (ref 0.44–1.00)
Glucose, Bld: 93 mg/dL (ref 70–99)
HCT: 33 % — ABNORMAL LOW (ref 36.0–46.0)
Hemoglobin: 11.2 g/dL — ABNORMAL LOW (ref 12.0–15.0)
Potassium: 5.5 mmol/L — ABNORMAL HIGH (ref 3.5–5.1)
Sodium: 128 mmol/L — ABNORMAL LOW (ref 135–145)
TCO2: 23 mmol/L (ref 22–32)

## 2024-04-21 LAB — I-STAT VENOUS BLOOD GAS, ED
Acid-base deficit: 1 mmol/L (ref 0.0–2.0)
Bicarbonate: 24.7 mmol/L (ref 20.0–28.0)
Calcium, Ion: 0.92 mmol/L — ABNORMAL LOW (ref 1.15–1.40)
HCT: 32 % — ABNORMAL LOW (ref 36.0–46.0)
Hemoglobin: 10.9 g/dL — ABNORMAL LOW (ref 12.0–15.0)
O2 Saturation: 93 %
Potassium: 5.5 mmol/L — ABNORMAL HIGH (ref 3.5–5.1)
Sodium: 127 mmol/L — ABNORMAL LOW (ref 135–145)
TCO2: 26 mmol/L (ref 22–32)
pCO2, Ven: 46 mmHg (ref 44–60)
pH, Ven: 7.337 (ref 7.25–7.43)
pO2, Ven: 71 mmHg — ABNORMAL HIGH (ref 32–45)

## 2024-04-21 LAB — CBC WITH DIFFERENTIAL/PLATELET
Abs Immature Granulocytes: 0.09 10*3/uL — ABNORMAL HIGH (ref 0.00–0.07)
Basophils Absolute: 0.1 10*3/uL (ref 0.0–0.1)
Basophils Relative: 1 %
Eosinophils Absolute: 0.1 10*3/uL (ref 0.0–0.5)
Eosinophils Relative: 2 %
HCT: 29.9 % — ABNORMAL LOW (ref 36.0–46.0)
Hemoglobin: 9.5 g/dL — ABNORMAL LOW (ref 12.0–15.0)
Immature Granulocytes: 1 %
Lymphocytes Relative: 11 %
Lymphs Abs: 1 10*3/uL (ref 0.7–4.0)
MCH: 30.8 pg (ref 26.0–34.0)
MCHC: 31.8 g/dL (ref 30.0–36.0)
MCV: 97.1 fL (ref 80.0–100.0)
Monocytes Absolute: 0.8 10*3/uL (ref 0.1–1.0)
Monocytes Relative: 8 %
Neutro Abs: 7.5 10*3/uL (ref 1.7–7.7)
Neutrophils Relative %: 77 %
Platelets: 387 10*3/uL (ref 150–400)
RBC: 3.08 MIL/uL — ABNORMAL LOW (ref 3.87–5.11)
RDW: 18 % — ABNORMAL HIGH (ref 11.5–15.5)
WBC: 9.5 10*3/uL (ref 4.0–10.5)
nRBC: 0 % (ref 0.0–0.2)

## 2024-04-21 LAB — COMPREHENSIVE METABOLIC PANEL WITH GFR
ALT: 25 U/L (ref 0–44)
AST: 36 U/L (ref 15–41)
Albumin: 3.7 g/dL (ref 3.5–5.0)
Alkaline Phosphatase: 146 U/L — ABNORMAL HIGH (ref 38–126)
Anion gap: 19 — ABNORMAL HIGH (ref 5–15)
BUN: 67 mg/dL — ABNORMAL HIGH (ref 8–23)
CO2: 21 mmol/L — ABNORMAL LOW (ref 22–32)
Calcium: 7.9 mg/dL — ABNORMAL LOW (ref 8.9–10.3)
Chloride: 88 mmol/L — ABNORMAL LOW (ref 98–111)
Creatinine, Ser: 4.73 mg/dL — ABNORMAL HIGH (ref 0.44–1.00)
GFR, Estimated: 10 mL/min — ABNORMAL LOW
Glucose, Bld: 91 mg/dL (ref 70–99)
Potassium: 5.8 mmol/L — ABNORMAL HIGH (ref 3.5–5.1)
Sodium: 128 mmol/L — ABNORMAL LOW (ref 135–145)
Total Bilirubin: 0.4 mg/dL (ref 0.0–1.2)
Total Protein: 7.5 g/dL (ref 6.5–8.1)

## 2024-04-21 LAB — TROPONIN T, HIGH SENSITIVITY
Troponin T High Sensitivity: 60 ng/L — ABNORMAL HIGH (ref 0–19)
Troponin T High Sensitivity: 56 ng/L — ABNORMAL HIGH (ref 0–19)

## 2024-04-21 MED ORDER — CHLORHEXIDINE GLUCONATE CLOTH 2 % EX PADS: 6.0000 | MEDICATED_PAD | Freq: Every day | CUTANEOUS | Status: DC

## 2024-04-21 NOTE — ED Triage Notes (Signed)
 Pt bib gems from home c/o shob, a&ox4. Hx of opd, chf, kidney failure, missed dialysis x2wks due to weather. Rales, rhonichi, wheezing. Abd rigid. 80% ra. 989% duo neb, 2 duonebs given with ems.  140/70 80s hr

## 2024-04-21 NOTE — Consult Note (Addendum)
 Initial Consultation Note   Patient: [name] FMW:[id number] DOB: 07-01-1960 PCP: Pcp, No DOA: 04/21/2024 DOS: the patient was seen and examined on 04/21/2024 Primary service: Tegeler, Lonni PARAS, *  Referring physician: Dr. Cleotis Reason for consult: Shortness of breath  Assessment/Plan: Assessment and Plan: No notes have been filed under this hospital service. Service: Hospitalist  Acute on chronic hypoxic respiratory failure secondary to acute pulmonary edema, bilateral pleural effusions due to missing dialysis on a patient with ESRD, POA: Patient carries longstanding history of noncompliance, not going to the dialysis, not wearing oxygen , not taking any of her medications.  She tends to come to the emergency department whenever she feels shortness of breath and requires dialysis.  Based on this, I had a lengthy discussion with the ED physician when I was called for admission.  I was informed that nephrology has been called and they recommend admission and they will do dialysis in the morning.  Looking at the chest x-ray and hyperkalemia, I was concerned that patient rather requires urgent dialysis and may not wait until morning.  I requested ED physician to have another discussion with the nephrologist to perform dialysis sooner than later and likely discharge home as there is no other indication to admit this patient in the hospital.  ED physician then had another discussion with nephrologist who is going to do dialysis as soon as possible.  Patient herself prefers to go home after dialysis if she feels well.  I am hoping that she will improve when we will be able to go home.  If not, and if admission is absolutely required, [facility] is available to be reconsulted for possible admission.  [facility] will sign off at present, please call us  again when needed.  HPI: [name] is a 64 y.o. female with past medical history of COPD supposed to be on home oxygen , ESRD on HD, noncompliance due to  homelessness, polysubstance abuse, diastolic CHF, hypertension, dyslipidemia, anxiety, cirrhosis, ascites who presented to ED with shortness of breath.  This is a patient well-known to me due to her recent hospitalization where she was discharged on 04/15/2024.  She was here for COPD exacerbation, recurrent ascites, acute on chronic hypoxic and hypercapnic respiratory failure secondary to missing dialysis due to being noncompliant as well as uremic encephalopathy.  Unfortunately, patient is extremely noncompliant.  She does not have any dialysis center.  She continues to smoke, she is supposed to be on oxygen  but does not like to wear that because she likes to smoke instead.  She tends to come to the emergency department every time she thinks she needs dialysis, gets hospitalized and then demands discharge whenever she pleases.  She tells me that she was doing fine up until today morning when she started having shortness of breath.  According to ED physician, when EMS arrived, she required nonrebreather.  In the ED, she was weaned to 6 L oxygen .  Chest x-ray shows pleural effusion and vascular congestion.  BMP showed potassium of 5.5 and rest of the labs were at her baseline.  ED physician requested admission under hospital service for dialysis.  I saw this patient in the ED, other than shortness of breath, she has no complaints.  She is bipolar with extreme emotional labile 3.  She tends to be dramatic, would start crying in between talks.  She is able to speak in full sentences.  During her recent hospitalization, she also changed her mind multiple times, at times she would ask to  discharge her and then other times she would refuse to go home.  Review of Systems: As mentioned in the history of present illness. All other systems reviewed and are negative. Past Medical History:  Diagnosis Date   Acute on chronic blood loss anemia 08/01/2022   Acute on chronic diastolic CHF (congestive heart failure) (HCC)  08/12/2022   Acute on chronic respiratory failure with hypoxia (HCC) 05/16/2023   Acute pulmonary edema (HCC) 05/16/2023   Acute seasonal allergic rhinitis due to pollen 02/22/2016   Alcohol  abuse    Allergy    Anxiety    Arteriovenous fistula infection, initial encounter 04/16/2023   Arthritis    Asthma    Cannabis abuse    Cocaine abuse (HCC)    COPD (chronic obstructive pulmonary disease) (HCC)    COPD with acute exacerbation (HCC) 10/25/2022   COVID 05/16/2023   Depression    Drug addiction (HCC)    GERD (gastroesophageal reflux disease)    GIB (gastrointestinal bleeding) 08/19/2019   Heart murmur    Heme positive stool 08/14/2022   Homelessness    Hx of adenomatous colonic polyps    Hyperlipidemia    Hypertension    pt stated every once in a while BP will be high but has not been prescribed medication for HTN.    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) 02/22/2016   SIRS (systemic inflammatory response syndrome) (HCC) 10/25/2022   Past Surgical History:  Procedure Laterality Date   A/V FISTULAGRAM Right 01/31/2023   Procedure: A/V Fistulagram;  Surgeon: Gretta Lonni PARAS, MD;  Location: [facility]IVE [facility];  Service: Cardiovascular;  Laterality: Right;   AV FISTULA PLACEMENT Right 08/14/2022   Procedure: RIGHT ARM BRACHIOBASILIC ATERIOVENOUS FISTULA CREATION;  Surgeon: Magda Debby SAILOR, MD;  Location: [facility] OR;  Service: Vascular;  Laterality: Right;   BASCILIC VEIN TRANSPOSITION Right 11/13/2022   Procedure: RIGHT ARM SECOND STAGE BASILIC VEIN TRANSPOSITION;  Surgeon: Magda Debby SAILOR, MD;  Location: [facility] OR;  Service: Vascular;  Laterality: Right;   BIOPSY  01/01/2019   Procedure: BIOPSY;  Surgeon: Albertus Gordy HERO, MD;  Location: [facility] ENDOSCOPY;  Service: Endoscopy;;   BIOPSY  11/17/2021   Procedure: BIOPSY;  Surgeon: Charlanne Groom, MD;  Location: [facility] ENDOSCOPY;  Service: Gastroenterology;;   CESAREAN SECTION  1989   COLONOSCOPY  11/07/2020   2018   CYSTOSCOPY W/ URETERAL STENT PLACEMENT Left 08/01/2018   Procedure: CYSTOSCOPY WITH RETROGRADE PYELOGRAM/URETERAL STENT PLACEMENT;  Surgeon: Carolee Sherwood JONETTA DOUGLAS, MD;  Location: [facility] ORS;  Service: Urology;  Laterality: Left;   CYSTOSCOPY WITH RETROGRADE PYELOGRAM, URETEROSCOPY AND STENT PLACEMENT Left 01/15/2019   Procedure: CYSTOSCOPY WITH RETROGRADE PYELOGRAM, URETEROSCOPY AND STENT PLACEMENT;  Surgeon: Carolee Sherwood JONETTA DOUGLAS, MD;  Location: [facility] ORS;  Service: Urology;  Laterality: Left;   ENTEROSCOPY N/A 08/16/2022   Procedure: ENTEROSCOPY;  Surgeon: Wilhelmenia Aloha Raddle., MD;  Location: [facility] ENDOSCOPY;  Service: Gastroenterology;  Laterality: N/A;   ENTEROSCOPY N/A 10/31/2022   Procedure: ENTEROSCOPY;  Surgeon: Albertus Gordy HERO, MD;  Location: [facility] ENDOSCOPY;  Service: Gastroenterology;  Laterality: N/A;   ENTEROSCOPY N/A 05/09/2023   Procedure: ENTEROSCOPY;  Surgeon: Federico Rosario BROCKS, MD;  Location: [facility] ENDOSCOPY;  Service: Gastroenterology;  Laterality: N/A;   ENTEROSCOPY N/A 09/09/2023   Procedure: ENTEROSCOPY;  Surgeon: Federico Rosario BROCKS, MD;  Location: [facility] ENDOSCOPY;  Service: Gastroenterology;  Laterality: N/A;   ENTEROSCOPY N/A 10/26/2023   Procedure: ENTEROSCOPY;  Surgeon: Albertus Gordy HERO, MD;  Location: [facility] ENDOSCOPY;  Service: Gastroenterology;  Laterality: N/A;   ESOPHAGOGASTRODUODENOSCOPY (EGD) WITH PROPOFOL  N/A 01/01/2019   Procedure: ESOPHAGOGASTRODUODENOSCOPY (EGD) WITH PROPOFOL ;  Surgeon: Albertus Gordy HERO, MD;  Location: [facility] ENDOSCOPY;  Service: Endoscopy;  Laterality: N/A;   ESOPHAGOGASTRODUODENOSCOPY (EGD) WITH PROPOFOL  N/A 08/21/2019   Procedure: ESOPHAGOGASTRODUODENOSCOPY (EGD) WITH PROPOFOL ;  Surgeon: Shila Gustav GAILS, MD;  Location: [facility] ENDOSCOPY;  Service:  Endoscopy;  Laterality: N/A;   ESOPHAGOGASTRODUODENOSCOPY (EGD) WITH PROPOFOL  N/A 11/17/2021   Procedure: ESOPHAGOGASTRODUODENOSCOPY (EGD) WITH PROPOFOL ;  Surgeon: Charlanne Groom, MD;  Location: [facility] ENDOSCOPY;  Service: Gastroenterology;  Laterality: N/A;   FRACTURE SURGERY Left 2011   arm   HEMOSTASIS CLIP PLACEMENT  08/16/2022   Procedure: HEMOSTASIS CLIP PLACEMENT;  Surgeon: Wilhelmenia Aloha Raddle., MD;  Location: [facility] ENDOSCOPY;  Service: Gastroenterology;;   HOT HEMOSTASIS N/A 08/16/2022   Procedure: HOT HEMOSTASIS (ARGON PLASMA COAGULATION/BICAP);  Surgeon: Wilhelmenia Aloha Raddle., MD;  Location: [facility] ENDOSCOPY;  Service: Gastroenterology;  Laterality: N/A;   HOT HEMOSTASIS N/A 10/31/2022   Procedure: HOT HEMOSTASIS (ARGON PLASMA COAGULATION/BICAP);  Surgeon: Albertus Gordy HERO, MD;  Location: [facility] ENDOSCOPY;  Service: Gastroenterology;  Laterality: N/A;   HOT HEMOSTASIS N/A 05/09/2023   Procedure: HOT HEMOSTASIS (ARGON PLASMA COAGULATION/BICAP);  Surgeon: Federico Rosario BROCKS, MD;  Location: [facility] ENDOSCOPY;  Service: Gastroenterology;  Laterality: N/A;   HOT HEMOSTASIS N/A 09/09/2023   Procedure: EGD, WITH ARGON PLASMA COAGULATION;  Surgeon: Federico Rosario BROCKS, MD;  Location: [facility] ENDOSCOPY;  Service: Gastroenterology;  Laterality: N/A;   INSERTION OF DIALYSIS CATHETER Right 08/14/2022   Procedure: INSERTION OF TUNNELED DIALYSIS CATHETER USING PALINDROME CATHETER KIT 19CM;  Surgeon: Magda Debby SAILOR, MD;  Location: [facility] OR;  Service: Vascular;  Laterality: Right;   IR PARACENTESIS  01/02/2024   IR PARACENTESIS  03/26/2024   IR PARACENTESIS  04/02/2024   IR THORACENTESIS RIGHT ASP PLEURAL SPACE W/IMG GUIDE  09/15/2023   POLYPECTOMY  08/16/2022   Procedure: POLYPECTOMY;  Surgeon: Wilhelmenia Aloha Raddle., MD;  Location: [facility] ENDOSCOPY;  Service: Gastroenterology;;   SUBMUCOSAL TATTOO INJECTION  08/16/2022   Procedure: SUBMUCOSAL TATTOO INJECTION;  Surgeon: Wilhelmenia Aloha Raddle., MD;  Location: [facility] ENDOSCOPY;  Service:  Gastroenterology;;   UPPER GASTROINTESTINAL ENDOSCOPY     Social History:  reports that she has been smoking cigarettes. She has been exposed to tobacco smoke. She has never used smokeless tobacco. She reports that she does not currently use alcohol . She reports current drug use. Drug: Marijuana.  Allergies[1]  Family History  Problem Relation Age of Onset   Diabetes Father    Colon cancer Neg Hx    Esophageal cancer Neg Hx    Rectal cancer Neg Hx    Stomach cancer Neg Hx     Prior to Admission medications  Medication Sig Start Date End Date Taking? Authorizing Provider  albuterol  (VENTOLIN  HFA) 108 (90 Base) MCG/ACT inhaler Inhale 2 puffs into the lungs every 6 (six) hours as needed for wheezing or shortness of breath. 03/13/24   Arrien, Elidia Sieving, MD  amLODipine  (NORVASC ) 2.5 MG tablet Take 1 tablet (2.5 mg total) by mouth 2 (two) times daily. 04/15/24 05/15/24  Vernon Ranks, MD  atorvastatin  (LIPITOR) 10 MG tablet Take 1 tablet (10 mg total) by mouth daily. 03/13/24  Arrien, Elidia Sieving, MD  busPIRone  (BUSPAR ) 5 MG tablet Take 1 tablet (5 mg total) by mouth 2 (two) times daily. 04/15/24 05/15/24  Vernon Ranks, MD  calcitRIOL  (ROCALTROL ) 0.25 MCG capsule Take 1 capsule (0.25 mcg total) by mouth daily. 03/13/24   Arrien, Mauricio Daniel, MD  gabapentin  (NEURONTIN ) 100 MG capsule Take 1 capsule (100 mg total) by mouth 3 (three) times daily. 03/13/24 04/14/24  Arrien, Elidia Sieving, MD  pantoprazole  (PROTONIX ) 40 MG tablet Take 1 tablet (40 mg total) by mouth 2 (two) times daily. 03/13/24 04/14/24  Arrien, Elidia Sieving, MD  sodium bicarbonate  650 MG tablet Take 2 tablets (1,300 mg total) by mouth 2 (two) times daily. 03/13/24   Arrien, Elidia Sieving, MD  torsemide  (DEMADEX ) 100 MG tablet Take 1 tablet (100 mg total) by mouth daily. 03/13/24 04/14/24  Arrien, Mauricio Daniel, MD  TRELEGY ELLIPTA  100-62.5-25 MCG/ACT AEPB Inhale 1 puff into the lungs daily. 03/13/24   Noralee Elidia Sieving, MD    Physical Exam: Vitals:   04/21/24 1806 04/21/24 2045  BP: (!) 145/78 (!) 139/110  Pulse: 78 71  Resp: (!) 21 15  Temp: 97.9 F (36.6 C)   SpO2: 95% 98%   General exam: Appears calm and comfortable  Respiratory system: Bilateral crackles. Cardiovascular system: S1 & S2 heard, RRR. No JVD, murmurs, rubs, gallops or clicks.  +1 pitting edema bilateral lower extremity which is chronic. Gastrointestinal system: Abdomen is nondistended, soft and nontender. No organomegaly or masses felt. Normal bowel sounds heard. Central nervous system: Alert and oriented. No focal neurological deficits. Extremities: Symmetric 5 x 5 power. Skin: No rashes, lesions or ulcers.  Psychiatry: Judgement and insight appear poor.  Emotionally labile.  Data Reviewed:   There are no new results to review at this time.    Family Communication: None Primary team communication: Discussed with ED physician. Thank you very much for involving us  in the care of your patient.  Author: Ranks Vernon, MD 04/21/2024 9:57 PM  For on call review [email or web address].      [1]  Allergies Allergen Reactions   Zestril  [Lisinopril ] Other (See Comments)    Hyperkalemia

## 2024-04-21 NOTE — ED Notes (Signed)
 Pt going to dialysis

## 2024-04-21 NOTE — ED Provider Notes (Signed)
 " [place] EMERGENCY DEPARTMENT AT [facility] Provider Note   CSN: [id number] Arrival date & time: 04/21/24  1754     Patient presents with: Shortness of Breath   [name] is a 64 y.o. female.   The history is provided by the patient and medical records. The history is limited by the condition of the patient. No language interpreter was used.  Shortness of Breath Severity:  Severe Onset quality:  Gradual Duration:  3 days Timing:  Constant Progression:  Worsening Chronicity:  Recurrent Context: not URI   Context comment:  Missing dialysis Relieved by:  Oxygen  Worsened by:  Nothing Ineffective treatments:  None tried Associated symptoms: no abdominal pain, no chest pain, no cough, no fever, no headaches, no neck pain, no sputum production, no vomiting and no wheezing        Prior to Admission medications  Medication Sig Start Date End Date Taking? Authorizing Provider  albuterol  (VENTOLIN  HFA) 108 (90 Base) MCG/ACT inhaler Inhale 2 puffs into the lungs every 6 (six) hours as needed for wheezing or shortness of breath. 03/13/24   Arrien, Elidia Sieving, MD  amLODipine  (NORVASC ) 2.5 MG tablet Take 1 tablet (2.5 mg total) by mouth 2 (two) times daily. 04/15/24 05/15/24  Vernon Ranks, MD  atorvastatin  (LIPITOR) 10 MG tablet Take 1 tablet (10 mg total) by mouth daily. 03/13/24   Arrien, Elidia Sieving, MD  busPIRone  (BUSPAR ) 5 MG tablet Take 1 tablet (5 mg total) by mouth 2 (two) times daily. 04/15/24 05/15/24  Vernon Ranks, MD  calcitRIOL  (ROCALTROL ) 0.25 MCG capsule Take 1 capsule (0.25 mcg total) by mouth daily. 03/13/24   Arrien, Mauricio Daniel, MD  gabapentin  (NEURONTIN ) 100 MG capsule Take 1 capsule (100 mg total) by mouth 3 (three) times daily. 03/13/24 04/14/24  Arrien, Elidia Sieving, MD  pantoprazole  (PROTONIX ) 40 MG tablet Take 1 tablet (40 mg total) by mouth 2 (two) times daily. 03/13/24 04/14/24  Arrien, Elidia Sieving, MD  sodium bicarbonate  650 MG  tablet Take 2 tablets (1,300 mg total) by mouth 2 (two) times daily. 03/13/24   Arrien, Elidia Sieving, MD  torsemide  (DEMADEX ) 100 MG tablet Take 1 tablet (100 mg total) by mouth daily. 03/13/24 04/14/24  Arrien, Mauricio Daniel, MD  TRELEGY ELLIPTA  100-62.5-25 MCG/ACT AEPB Inhale 1 puff into the lungs daily. 03/13/24   Arrien, Elidia Sieving, MD    Allergies: Zestril  [lisinopril ]    Review of Systems  Constitutional:  Positive for fatigue. Negative for chills and fever.  HENT:  Negative for congestion.   Respiratory:  Positive for chest tightness and shortness of breath. Negative for cough, sputum production and wheezing.   Cardiovascular:  Positive for leg swelling. Negative for chest pain and palpitations.  Gastrointestinal:  Negative for abdominal pain, constipation, diarrhea, nausea and vomiting.  Genitourinary:  Negative for flank pain.  Musculoskeletal:  Negative for back pain and neck pain.  Neurological:  Negative for syncope and headaches.  Psychiatric/Behavioral:  Negative for agitation.     Updated Vital Signs BP (!) 145/78   Pulse 78   Temp 97.9 F (36.6 C)   Resp (!) 21   SpO2 95%   Physical Exam Vitals and nursing note reviewed.  Constitutional:      General: She is in acute distress.     Appearance: She is well-developed. She is ill-appearing.  HENT:     Head: Normocephalic and atraumatic.  Eyes:     Extraocular Movements: Extraocular movements intact.     Conjunctiva/sclera:  Conjunctivae normal.     Pupils: Pupils are equal, round, and reactive to light.  Cardiovascular:     Rate and Rhythm: Normal rate and regular rhythm.     Heart sounds: No murmur heard. Pulmonary:     Effort: Pulmonary effort is normal. Tachypnea present. No respiratory distress.     Breath sounds: Rhonchi and rales present. No wheezing.  Chest:     Chest wall: No tenderness.  Abdominal:     Palpations: Abdomen is soft.     Tenderness: There is no abdominal tenderness.   Musculoskeletal:        General: No swelling.     Cervical back: Neck supple.     Right lower leg: Edema present.     Left lower leg: Edema present.  Skin:    General: Skin is warm and dry.     Capillary Refill: Capillary refill takes less than 2 seconds.     Findings: No erythema.  Psychiatric:        Mood and Affect: Mood normal.     (all labs ordered are listed, but only abnormal results are displayed) Labs Reviewed  CBC WITH DIFFERENTIAL/PLATELET - Abnormal; Notable for the following components:      Result Value   RBC 3.08 (*)    Hemoglobin 9.5 (*)    HCT 29.9 (*)    RDW 18.0 (*)    Abs Immature Granulocytes 0.09 (*)    All other components within normal limits  COMPREHENSIVE METABOLIC PANEL WITH GFR - Abnormal; Notable for the following components:   Sodium 128 (*)    Potassium 5.8 (*)    Chloride 88 (*)    CO2 21 (*)    BUN 67 (*)    Creatinine, Ser 4.73 (*)    Calcium  7.9 (*)    Alkaline Phosphatase 146 (*)    GFR, Estimated 10 (*)    Anion gap 19 (*)    All other components within normal limits  I-STAT CHEM 8, ED - Abnormal; Notable for the following components:   Sodium 128 (*)    Potassium 5.5 (*)    Chloride 93 (*)    BUN 65 (*)    Creatinine, Ser 5.60 (*)    Calcium , Ion 0.92 (*)    Hemoglobin 11.2 (*)    HCT 33.0 (*)    All other components within normal limits  I-STAT VENOUS BLOOD GAS, ED - Abnormal; Notable for the following components:   pO2, Ven 71 (*)    Sodium 127 (*)    Potassium 5.5 (*)    Calcium , Ion 0.92 (*)    HCT 32.0 (*)    Hemoglobin 10.9 (*)    All other components within normal limits  TROPONIN T, HIGH SENSITIVITY - Abnormal; Notable for the following components:   Troponin T High Sensitivity 60 (*)    All other components within normal limits  TROPONIN T, HIGH SENSITIVITY - Abnormal; Notable for the following components:   Troponin T High Sensitivity 56 (*)    All other components within normal limits  HEPATITIS B SURFACE  ANTIGEN  HEPATITIS B SURFACE ANTIBODY, QUANTITATIVE    EKG: EKG Interpretation Date/Time:  Wednesday April 21 2024 18:05:23 EST Ventricular Rate:  82 PR Interval:  179 QRS Duration:  103 QT Interval:  382 QTC Calculation: 447 R Axis:   45  Text Interpretation: Sinus rhythm Probable left atrial enlargement Baseline wander in lead(s) V5 when compared top rior, more baseline wander No STEMI Confirmed  by Ginger Barefoot ([id number]) on 04/21/2024 7:08:14 PM  Radiology: DG Chest Portable 1 View Result Date: 04/21/2024 CLINICAL DATA:  Respiratory distress. Missed dialysis for 2 weeks. Worsening peripheral edema. EXAM: PORTABLE CHEST 1 VIEW COMPARISON:  04/09/2024 FINDINGS: Stable cardiomegaly. Increasing left pleural effusion. Volume loss in the past the at the left lung base likely compressive atelectasis. Small right pleural effusion has slightly increased. Vascular congestion. Patchy opacity in the right lower lung zone. No pneumothorax. IMPRESSION: 1. Increasing left pleural effusion with left basilar atelectasis. 2. Small right pleural effusion has slightly increased. 3. Patchy opacity in the right lower lung zone, atelectasis versus pneumonia. 4. Cardiomegaly with vascular congestion. Electronically Signed   By: Andrea Gasman M.D.   On: 04/21/2024 19:20     Procedures   CRITICAL CARE Performed by: Lonni PARAS Assata Juncaj Total critical care time: 25 minutes Critical care time was exclusive of separately billable procedures and treating other patients. Critical care was necessary to treat or prevent imminent or life-threatening deterioration. Critical care was time spent personally by me on the following activities: development of treatment plan with patient and/or surrogate as well as nursing, discussions with consultants, evaluation of patient's response to treatment, examination of patient, obtaining history from patient or surrogate, ordering and performing treatments and interventions,  ordering and review of laboratory studies, ordering and review of radiographic studies, pulse oximetry and re-evaluation of patient's condition.   Medications Ordered in the ED  Chlorhexidine  Gluconate Cloth 2 % PADS 6 each (has no administration in time range)                                    Medical Decision Making Amount and/or Complexity of Data Reviewed Labs: ordered. Radiology: ordered.    Anetria M Oats is a 64 y.o. female with a past medical history significant for ESRD, CHF, GERD, COPD, and previous GI bleeding who presents with respiratory distress and peripheral edema.  According to patient, she missed dialysis for the last 2 weeks due to ice no and weather difficulties.  She reports that the last few days she is having worsened breathing and was found to have oxygen  saturation of 80% on room air.  She does not do oxygen  at home.  EMS gave DuoNebs and now she is on 6 L to keep her oxygen  saturations in the 90s.  She initially was on nonrebreather but that has improved.  She says her legs are edematous and she is weeping fluid.  She is denying any nausea, vomiting or abdominal pain.  She denies any chest pain or palpitations at this time but does have the shortness of breath.  She denies fevers, chills, or cough.  Does not feel this is infectious.  She thinks it is all due to missing dialysis.  On my exam, she initially was somnolent but then I was able to sternal rub her and she woke up.  She is answering questions now.  She does have rales in all of her lungs without significant wheezing.  There are some rhonchi.  Chest and abdomen nontender.  Legs are edematous for her she reports.  Moving all extremities.  She is tachypneic.  She is on 6 L now.  EKG does not show STEMI.  Clinically I am concerned about fluid overload and respiratory distress.  Will get labs, chest x-ray and will call nephrology as I anticipate she needs dialysis tonight.  Given her  ill appearance I anticipate  she will need admission as well.  I spoke to nephrology who called back very quickly and they agreed with the plan.  They will come see the patient to discuss dialysis and agree with admission given the patient's appearance once we get some labs back.   8:14 PM Spoke to the admitting team who reports that he actually took care of this patient several days ago when she was admitted and he says she has not missed dialysis for 2 weeks.  He requested I call the nephrology team again to let them know he thinks she needs dialysis sooner tonight and then possibly be able to be discharged.  Will discuss with nephrology given the new information.  Nephrology agrees with dialysis tonight and then reassess in the emergency department with likely plan for discharge if she is doing better versus admission if she is not.     Final diagnoses:  Hypoxia  SOB (shortness of breath)  ESRD (end stage renal disease) on dialysis [facility])    Clinical Impression: 1. Hypoxia   2. SOB (shortness of breath)   3. ESRD (end stage renal disease) on dialysis [facility])     Disposition: Patient taken to dialysis given the hypoxia and shortness of breath and missing dialysis.  Anticipate reassessment in the emergency department after dialysis completed.  This note was prepared with assistance of Conservation officer, historic buildings. Occasional wrong-word or sound-a-like substitutions may have occurred due to the inherent limitations of voice recognition software.     Kendell Sagraves, Lonni PARAS, MD 04/21/24 2355  "
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
## 2024-04-22 LAB — HEPATITIS B SURFACE ANTIGEN: Hepatitis B Surface Ag: NONREACTIVE

## 2024-04-22 MED ORDER — HYDROCODONE-ACETAMINOPHEN 5-325 MG PO TABS
1.0000 | ORAL_TABLET | Freq: Once | ORAL | Status: AC
  Administered 2024-04-22: 1 via ORAL
  Filled 2024-04-22: qty 1

## 2024-04-22 NOTE — Progress Notes (Signed)
 [facility] Progress Note  Subjective:  Seen in ED room 3.8 L UF w/ HD overnight came off at 4 am No new c/o's   Presentation summary: [name] is a/an 64 y.o. female with a past medical history of ESRD who presents with past medical history notable for ESRD on HD pending admission with COPD supposed to be on home oxygen , ESRD on HD without a HD unit, noncompliance due to homelessness, polysubstance abuse, diastolic CHF, hypertension, dyslipidemia, anxiety, cirrhosis, ascites presents with SOB and increased hypoxia sat of 80% on RA requiring supplemental O2. Pt is without a HD outpt unit due to homelessness and non-adherence. Last dialyzed 04/15/24 prior to discharge. Returning with hypoxia and volume overload. Pt has been taking her medications as prescribed. Remained unable to present to the ED 2/2 limited transportation restricted by recent snow/ice. Pt states legs are weeping and fluid is built up making it hard to breath. She still makes urine though unable to estimate quantity. States she is trying to adhere to fluid restriction and medications but given her living and social situation it has been difficult. Currently staying with her ex partner. Does have abdominal pain from ascites building up.   Vitals:   04/22/24 0430 04/22/24 0520 04/22/24 0740 04/22/24 0744  BP: (!) 164/72 (!) 161/66 (!) 160/81   Pulse: 87 87 90   Resp: (!) 21 (!) 27 20   Temp: 97.8 F (36.6 C)   98.6 F (37 C)  TempSrc: Oral     SpO2: 95% 99% 90%     Exam: General: ill-appearing, no acute distress,  HEENT: anicteric sclera, MMM, edentulous  CV: normal rate, no murmurs, 2+ edema bilateral lower extremities  Lungs: bilateral chest rise, increased wob, on 6LNC No air movement on lower left lung field, speaking in short phrases, tachypneic, rales  Abd: soft, mild-tenderness to palpation, distended Skin: no visible lesions or rashes Psych: alert, engaged, appropriate mood and affect Neuro:  normal speech, no gross focal deficits     OP HD: see below    Assessment/ Plan:  # ESRD - has not outpatient unit at this time - pt knows to come to hospital for HD   # Volume - EDW estimated at 57-63 kg based on recent hospital stay - max UF w/ HD last night    # Anemia of Chronic Kidney Disease: Hemoglobin 9.5. received aranesp  150mcg about 1 week ago and got 2UPRBCs and venoferx2 during that admission  .    # Secondary Hyperparathyroidism/Hyperphosphatemia:  Calcitriol  0.25mcg daily     # Vascular access: RUE AVF good bruit and thrill    # GOC  - pt is DNR-Limited - very poor prognosis w/ multi organ failure and no social support   Myer Fret MD  [facility] 04/22/2024, 12:35 PM  Recent Labs  Lab 04/21/24 1911 04/21/24 1921  HGB 9.5* 10.9*  11.2*  ALBUMIN  3.7  --   CALCIUM  7.9*  --   CREATININE 4.73* 5.60*  K 5.8* 5.5*  5.5*   No results for input(s): IRON , TIBC, FERRITIN in the last 168 hours. Inpatient medications:  Chlorhexidine  Gluconate Cloth  6 each Topical Q0600

## 2024-04-22 NOTE — Discharge Instructions (Signed)
Follow up with your physician

## 2024-04-22 NOTE — ED Provider Notes (Signed)
 On return from dialysis patient sitting upright, edge of bed, in no distress, requests analgesia which was provided, patient discharged to follow-up with primary care and ongoing outpatient dialysis.   Garrick Charleston, MD 04/22/24 1356

## 2024-04-22 NOTE — Progress Notes (Signed)
" °   04/22/24 0430  Vitals  Temp 97.8 F (36.6 C)  Temp Source Oral  BP (!) 164/72  MAP (mmHg) 96  BP Location Left Arm  BP Method Automatic  Patient Position (if appropriate) Lying  Pulse Rate 87  Pulse Rate Source Monitor  ECG Heart Rate 88  Resp (!) 21  Oxygen  Therapy  SpO2 95 %  O2 Device Nasal Cannula  O2 Flow Rate (L/min) 2 L/min  During Treatment Monitoring  Blood Flow Rate (mL/min) 0 mL/min  Arterial Pressure (mmHg) -0.4 mmHg  Venous Pressure (mmHg) -1.01 mmHg  TMP (mmHg) -50.91 mmHg  Ultrafiltration Rate (mL/min) 1288 mL/min  Dialysate Flow Rate (mL/min) 299 ml/min  Dialysate Potassium Concentration 3  Dialysate Calcium  Concentration 2.5  Duration of HD Treatment -hour(s) 3.88 hour(s)  Cumulative Fluid Removed (mL) per Treatment  3839.77  HD Safety Checks Performed Yes  Intra-Hemodialysis Comments Tx completed  Post Treatment  Dialyzer Clearance Lightly streaked  Liters Processed 93  Fluid Removed (mL) 3800 mL  Tolerated HD Treatment Yes  AVG/AVF Arterial Site Held (minutes) 10 minutes  AVG/AVF Venous Site Held (minutes) 10 minutes    "

## 2024-04-23 LAB — HEPATITIS B SURFACE ANTIBODY, QUANTITATIVE: Hep B S AB Quant (Post): <3.5 m[IU]/mL — ABNORMAL LOW
